# Patient Record
Sex: Male | Born: 1970 | Race: White | Hispanic: No | Marital: Single | Smoking: Never smoker
Health system: Southern US, Community
[De-identification: ages and names within clinical notes are randomized; demographics above are authoritative.]

## PROBLEM LIST (undated history)

## (undated) DIAGNOSIS — I25110 Atherosclerotic heart disease of native coronary artery with unstable angina pectoris: Secondary | ICD-10-CM

## (undated) DIAGNOSIS — J45909 Unspecified asthma, uncomplicated: Secondary | ICD-10-CM

## (undated) DIAGNOSIS — R51 Headache: Secondary | ICD-10-CM

## (undated) DIAGNOSIS — E039 Hypothyroidism, unspecified: Secondary | ICD-10-CM

## (undated) DIAGNOSIS — K3184 Gastroparesis: Secondary | ICD-10-CM

## (undated) DIAGNOSIS — R269 Unspecified abnormalities of gait and mobility: Secondary | ICD-10-CM

## (undated) DIAGNOSIS — F419 Anxiety disorder, unspecified: Secondary | ICD-10-CM

## (undated) DIAGNOSIS — F32A Depression, unspecified: Secondary | ICD-10-CM

## (undated) DIAGNOSIS — G629 Polyneuropathy, unspecified: Secondary | ICD-10-CM

## (undated) DIAGNOSIS — E1121 Type 2 diabetes mellitus with diabetic nephropathy: Secondary | ICD-10-CM

## (undated) DIAGNOSIS — Z992 Dependence on renal dialysis: Secondary | ICD-10-CM

## (undated) DIAGNOSIS — M21371 Foot drop, right foot: Secondary | ICD-10-CM

## (undated) DIAGNOSIS — N186 End stage renal disease: Secondary | ICD-10-CM

## (undated) DIAGNOSIS — G6181 Chronic inflammatory demyelinating polyneuritis: Secondary | ICD-10-CM

## (undated) DIAGNOSIS — I82409 Acute embolism and thrombosis of unspecified deep veins of unspecified lower extremity: Secondary | ICD-10-CM

## (undated) DIAGNOSIS — I10 Essential (primary) hypertension: Secondary | ICD-10-CM

## (undated) DIAGNOSIS — F329 Major depressive disorder, single episode, unspecified: Secondary | ICD-10-CM

## (undated) DIAGNOSIS — R519 Headache, unspecified: Secondary | ICD-10-CM

## (undated) DIAGNOSIS — N049 Nephrotic syndrome with unspecified morphologic changes: Secondary | ICD-10-CM

## (undated) DIAGNOSIS — M21372 Foot drop, left foot: Secondary | ICD-10-CM

## (undated) DIAGNOSIS — I447 Left bundle-branch block, unspecified: Secondary | ICD-10-CM

## (undated) HISTORY — PX: TONSILLECTOMY AND ADENOIDECTOMY: SUR1326

## (undated) HISTORY — DX: Chronic inflammatory demyelinating polyneuritis: G61.81

## (undated) HISTORY — DX: Foot drop, left foot: M21.372

## (undated) HISTORY — DX: Acute embolism and thrombosis of unspecified deep veins of unspecified lower extremity: I82.409

## (undated) HISTORY — PX: RETINAL LASER PROCEDURE: SHX2339

## (undated) HISTORY — PX: EYE SURGERY: SHX253

## (undated) HISTORY — DX: Unspecified abnormalities of gait and mobility: R26.9

## (undated) HISTORY — DX: Nephrotic syndrome with unspecified morphologic changes: N04.9

## (undated) HISTORY — PX: FRACTURE SURGERY: SHX138

## (undated) HISTORY — DX: Foot drop, right foot: M21.371

---
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
## 2011-04-21 ENCOUNTER — Ambulatory Visit: Payer: Self-pay

## 2011-05-24 ENCOUNTER — Telehealth: Payer: Self-pay

## 2011-05-24 ENCOUNTER — Other Ambulatory Visit: Payer: Self-pay | Admitting: Family Medicine

## 2011-05-24 MED ORDER — ALPRAZOLAM 0.5 MG PO TABS: 0.5000 mg | ORAL_TABLET | Freq: Two times a day (BID) | ORAL | Status: AC

## 2011-05-24 NOTE — Telephone Encounter (Signed)
Tim from [facility] in [place] states that pt needs a refill on xanax .5 mg

## 2011-05-24 NOTE — Telephone Encounter (Signed)
Please pull paper chart.  

## 2011-05-25 NOTE — Telephone Encounter (Signed)
Already refilled

## 2011-07-22 ENCOUNTER — Other Ambulatory Visit: Payer: Self-pay | Admitting: Family Medicine

## 2011-07-25 ENCOUNTER — Other Ambulatory Visit: Payer: Self-pay | Admitting: *Deleted

## 2011-07-25 MED ORDER — ALPRAZOLAM 0.5 MG PO TABS: 0.5000 mg | ORAL_TABLET | Freq: Two times a day (BID) | ORAL | Status: DC

## 2011-08-16 ENCOUNTER — Other Ambulatory Visit: Payer: Self-pay | Admitting: Family Medicine

## 2011-08-17 ENCOUNTER — Other Ambulatory Visit: Payer: Self-pay | Admitting: *Deleted

## 2011-08-21 ENCOUNTER — Telehealth: Payer: Self-pay

## 2011-08-21 NOTE — Telephone Encounter (Signed)
Pharmacy is calling for xanax refill - [facility] in [place] [address], [place]  [phone]

## 2011-08-22 NOTE — Telephone Encounter (Signed)
Looks like patient is due for recheck, or can we given partial rx?

## 2011-08-23 MED ORDER — ALPRAZOLAM 0.5 MG PO TABS: 0.5000 mg | ORAL_TABLET | Freq: Two times a day (BID) | ORAL | Status: AC

## 2011-08-23 NOTE — Telephone Encounter (Signed)
Done and printed. No further refill without office visit.

## 2011-08-23 NOTE — Telephone Encounter (Signed)
Faxed in Rx for xanax, but need to advise pt that he needs OV before any add'l RFs. He has been given #30 tabs.  LMOM to CB

## 2011-08-24 NOTE — Telephone Encounter (Signed)
I have advised patient. He has no insurance currently, but will make arrangements to come in. He is going on vacation and will try to come in for this.

## 2011-08-24 NOTE — Telephone Encounter (Signed)
I have left message for him to call me back about his need for office visit.

## 2011-10-04 ENCOUNTER — Other Ambulatory Visit: Payer: Self-pay | Admitting: Family Medicine

## 2011-10-04 DIAGNOSIS — M549 Dorsalgia, unspecified: Secondary | ICD-10-CM

## 2011-10-04 MED ORDER — ALPRAZOLAM 0.5 MG PO TABS: 0.5000 mg | ORAL_TABLET | Freq: Every evening | ORAL | Status: DC | PRN

## 2011-10-04 MED ORDER — CYCLOBENZAPRINE HCL 10 MG PO TABS: 10.0000 mg | ORAL_TABLET | Freq: Every evening | ORAL | Status: DC | PRN

## 2011-11-28 ENCOUNTER — Ambulatory Visit (INDEPENDENT_AMBULATORY_CARE_PROVIDER_SITE_OTHER): Payer: 59 | Admitting: Family Medicine

## 2011-11-28 VITALS — BP 140/90 | HR 112 | Temp 98.8°F | Resp 20 | Ht 72.0 in | Wt 219.0 lb

## 2011-11-28 DIAGNOSIS — E1165 Type 2 diabetes mellitus with hyperglycemia: Secondary | ICD-10-CM

## 2011-11-28 DIAGNOSIS — IMO0001 Reserved for inherently not codable concepts without codable children: Secondary | ICD-10-CM

## 2011-11-28 LAB — COMPREHENSIVE METABOLIC PANEL
ALT: 19 U/L (ref 0–53)
AST: 15 U/L (ref 0–37)
Albumin: 4.4 g/dL (ref 3.5–5.2)
Alkaline Phosphatase: 85 U/L (ref 39–117)
BUN: 16 mg/dL (ref 6–23)
CO2: 27 mEq/L (ref 19–32)
Calcium: 9.9 mg/dL (ref 8.4–10.5)
Chloride: 101 mEq/L (ref 96–112)
Creat: 0.92 mg/dL (ref 0.50–1.35)
Glucose, Bld: 298 mg/dL — ABNORMAL HIGH (ref 70–99)
Potassium: 4.1 mEq/L (ref 3.5–5.3)
Sodium: 135 mEq/L (ref 135–145)
Total Bilirubin: 0.5 mg/dL (ref 0.3–1.2)
Total Protein: 7.3 g/dL (ref 6.0–8.3)

## 2011-11-28 LAB — LIPID PANEL
Cholesterol: 251 mg/dL — ABNORMAL HIGH (ref 0–200)
HDL: 41 mg/dL (ref >39)
LDL Cholesterol: 159 mg/dL — ABNORMAL HIGH (ref 0–99)
Total CHOL/HDL Ratio: 6.1 Ratio
Triglycerides: 253 mg/dL — ABNORMAL HIGH (ref <150)
VLDL: 51 mg/dL — ABNORMAL HIGH (ref 0–40)

## 2011-11-28 LAB — POCT UA - MICROSCOPIC ONLY
Bacteria, U Microscopic: NEGATIVE
Casts, Ur, LPF, POC: NEGATIVE
Crystals, Ur, HPF, POC: NEGATIVE
Mucus, UA: NEGATIVE
Yeast, UA: NEGATIVE

## 2011-11-28 LAB — POCT GLYCOSYLATED HEMOGLOBIN (HGB A1C): Hemoglobin A1C: 13.7

## 2011-11-28 LAB — POCT URINALYSIS DIPSTICK
Bilirubin, UA: NEGATIVE
Glucose, UA: 500
Ketones, UA: 15
Leukocytes, UA: NEGATIVE
Nitrite, UA: NEGATIVE
Protein, UA: 30
Spec Grav, UA: 1.015
Urobilinogen, UA: 0.2
pH, UA: 6

## 2011-11-28 LAB — GLUCOSE, POCT (MANUAL RESULT ENTRY): POC Glucose: 293 mg/dl — AB (ref 70–99)

## 2011-11-28 MED ORDER — SITAGLIPTIN PHOS-METFORMIN HCL 50-500 MG PO TABS: 1.0000 | ORAL_TABLET | Freq: Two times a day (BID) | ORAL | Status: DC

## 2011-11-28 NOTE — Progress Notes (Signed)
@[email or web address] @  Patient ID: Russell [name] MRN: [id number], DOB: 03/15/1970, 41 y.o. Date of Encounter: 11/28/2011, 2:01 PM  Primary Physician: No primary provider on file.  Chief Complaint: Diabetes follow up  HPI: 41 y.o. year old male with history below presents for diabetes mellitus which he has had x 4 years. Not doing well because of weight loss, burning hands and numb feet.. Taking medications daily.  Notes polydipsia, polyphagia, polyuria, or nocturia.  Blood sugars at home:  >500 He has been passing kidney stones Exercising regularly. Last A1C: ove a year ago   Past Medical History  Diagnosis Date  . Diabetes mellitus without complication      Home Meds: Prior to Admission medications   Medication Sig Start Date End Date Taking? Authorizing Provider  ALPRAZolam Prudy Feeler) 0.5 MG tablet Take 1 tablet (0.5 mg total) by mouth at bedtime as needed for sleep. 10/04/11  Yes [name], MD  cyclobenzaprine (FLEXERIL) 10 MG tablet Take 1 tablet (10 mg total) by mouth at bedtime as needed for muscle spasms. 10/04/11  Yes [name], MD  gabapentin (NEURONTIN) 600 MG tablet Take 600 mg by mouth 4 (four) times daily.   Yes Historical Provider, MD  ibuprofen (ADVIL,MOTRIN) 800 MG tablet Take 800 mg by mouth 2 (two) times daily as needed.   Yes Historical Provider, MD  metFORMIN (GLUMETZA) 1000 MG (MOD) 24 hr tablet Take 1,000 mg by mouth 2 (two) times daily.   Yes Historical Provider, MD    Allergies: No Known Allergies  History   Social History  . Marital Status: Single    Spouse Name: N/A    Number of Children: N/A  . Years of Education: N/A   Occupational History  . Not on file.   Social History Main Topics  . Smoking status: Never Smoker   . Smokeless tobacco: Not on file  . Alcohol Use: Not on file  . Drug Use: No  . Sexually Active: Yes   Other Topics Concern  . Not on file   Social History Narrative  . No narrative on file     Review of  Systems: Constitutional: negative for chills, fever, night sweats, weight changes, or fatigue  HEENT: negative for vision changes, hearing loss, congestion, rhinorrhea, or epistaxis Cardiovascular: negative for chest pain, palpitations, diaphoresis, DOE, orthopnea, or edema Respiratory: negative for hemoptysis, wheezing, shortness of breath, dyspnea, or cough Abdominal: negative for abdominal pain, nausea, vomiting, diarrhea, or constipation Dermatological: negative for rash, erythema, or wounds Neurologic: negative for headache, dizziness, or syncope Renal:  Negative for polyuria, polydipsia, or dysuria All other systems reviewed and are otherwise negative with the exception to those above and in the HPI.   Physical Exam: Blood pressure 140/90, pulse 112, temperature 98.8 F (37.1 C), temperature source Oral, resp. rate 20, height 6' (1.829 m), weight 219 lb (99.338 kg), SpO2 99.00%., Body mass index is 29.70 kg/(m^2). General: Well developed, well nourished, in no acute distress. Head: Normocephalic, atraumatic, eyes without discharge, sclera non-icteric, nares are without discharge. Bilateral auditory canals clear, TM's are without perforation, pearly grey and translucent with reflective cone of light bilaterally. Oral cavity moist, posterior pharynx without exudate, erythema, peritonsillar abscess, or post nasal drip.  Neck: Supple. No thyromegaly. Full ROM. No lymphadenopathy. Lungs: Clear bilaterally to auscultation without wheezes, rales, or rhonchi. Breathing is unlabored. Heart: RRR with S1 S2. No murmurs, rubs, or gallops appreciated. Abdomen: Soft, non-tender, non-distended with normoactive bowel sounds. No hepatosplenomegaly. No rebound/guarding. No obvious abdominal masses.  Msk:  Strength and tone normal for age. Extremities/Skin: Warm and dry. No clubbing or cyanosis. No edema. No rashes, wounds, or suspicious lesions.  Neuro: Alert and oriented X 3. Moves all extremities  spontaneously. Gait is normal. CNII-XII grossly in tact. Psych:  Responds to questions appropriately with a normal affect.   Labs: Results for orders placed in visit on 11/28/11  POCT GLYCOSYLATED HEMOGLOBIN (HGB A1C)      Component Value Range   Hemoglobin A1C 13.7    GLUCOSE, POCT (MANUAL RESULT ENTRY)      Component Value Range   POC Glucose 293 (*) 70 - 99 mg/dl  POCT URINALYSIS DIPSTICK      Component Value Range   Color, UA yellow     Clarity, UA clear     Glucose, UA 500     Bilirubin, UA neg     Ketones, UA 15     Spec Grav, UA 1.015     Blood, UA trace-intact     pH, UA 6.0     Protein, UA 30     Urobilinogen, UA 0.2     Nitrite, UA neg     Leukocytes, UA Negative    POCT UA - MICROSCOPIC ONLY      Component Value Range   WBC, Ur, HPF, POC 0-2     RBC, urine, microscopic 1-3     Bacteria, U Microscopic neg     Mucus, UA neg     Epithelial cells, urine per micros 0-1     Crystals, Ur, HPF, POC neg     Casts, Ur, LPF, POC neg     Yeast, UA neg        ASSESSMENT AND PLAN:  41 y.o. year old male with uncontrolled type 2 diabetes Check your sugar twice daily Recheck Saturday  -Levimir 30 units subq now.  Patient will take the pen home for possible future doses 1. Diabetes type 2, uncontrolled  POCT glycosylated hemoglobin (Hb A1C), POCT glucose (manual entry), Comprehensive metabolic panel, Lipid panel, POCT urinalysis dipstick, POCT UA - Microscopic Only, sitaGLIPtan-metformin (JANUMET) 50-500 MG per tablet     Signed, [name], MD 11/28/2011 2:01 PM

## 2011-11-29 ENCOUNTER — Other Ambulatory Visit: Payer: Self-pay | Admitting: Family Medicine

## 2011-11-29 DIAGNOSIS — E785 Hyperlipidemia, unspecified: Secondary | ICD-10-CM

## 2011-11-29 MED ORDER — SIMVASTATIN 40 MG PO TABS: 40.0000 mg | ORAL_TABLET | Freq: Every day | ORAL | Status: DC

## 2011-12-02 ENCOUNTER — Ambulatory Visit (INDEPENDENT_AMBULATORY_CARE_PROVIDER_SITE_OTHER): Payer: 59 | Admitting: Family Medicine

## 2011-12-02 VITALS — BP 128/78 | HR 107 | Temp 98.1°F | Resp 17 | Ht 73.0 in | Wt 223.0 lb

## 2011-12-02 DIAGNOSIS — E1165 Type 2 diabetes mellitus with hyperglycemia: Secondary | ICD-10-CM

## 2011-12-02 DIAGNOSIS — E1065 Type 1 diabetes mellitus with hyperglycemia: Secondary | ICD-10-CM

## 2011-12-02 NOTE — Progress Notes (Signed)
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 Since last visit he is noted decreasing in the tingling in his hands, decreased in polyuria, decreased and polydipsia. He's been checking his sugars and they've been gradually coming down so that is fasting this morning was 250. He's taking his Januvia 50/500 twice a day. He does have Levemir home and he does have NovoLog at home but hasn't been using them.  We discussed his diet and his exercise routine. Currently he's not exercising but feels that he gets funny of exercise at work. I encouraged him to take a walk or spin on a bicycle.  Objective: patient looks much healthier today with better color. He's calm and articulate.  I spent 30 minutes face to face with patient  Assessment: Patient is much better now with neuropathy easing and other symptoms of diabetes improving.  Plan: Janumet 50/500 twice daily Add Metformin 500 in evening Add Levimir 20 units daily until BS is below 200  Physical exam December 17th at 8 o'clock at [address]

## 2011-12-02 NOTE — Patient Instructions (Addendum)
Janumet 50/500 twice daily Add Metformin 500 in evening Add Levimir 20 units daily until BS is below 200  Physical exam December 17th at 8 o'clock at [address]

## 2011-12-19 ENCOUNTER — Ambulatory Visit (INDEPENDENT_AMBULATORY_CARE_PROVIDER_SITE_OTHER): Payer: 59 | Admitting: Family Medicine

## 2011-12-19 ENCOUNTER — Encounter: Payer: Self-pay | Admitting: Family Medicine

## 2011-12-19 ENCOUNTER — Other Ambulatory Visit: Payer: Self-pay | Admitting: Family Medicine

## 2011-12-19 VITALS — BP 157/88 | HR 116 | Temp 98.4°F | Resp 16 | Ht 72.0 in | Wt 228.6 lb

## 2011-12-19 DIAGNOSIS — I8390 Asymptomatic varicose veins of unspecified lower extremity: Secondary | ICD-10-CM

## 2011-12-19 DIAGNOSIS — E1165 Type 2 diabetes mellitus with hyperglycemia: Secondary | ICD-10-CM

## 2011-12-19 DIAGNOSIS — Z0000 Encounter for general adult medical examination without abnormal findings: Secondary | ICD-10-CM

## 2011-12-19 DIAGNOSIS — M549 Dorsalgia, unspecified: Secondary | ICD-10-CM

## 2011-12-19 DIAGNOSIS — E1140 Type 2 diabetes mellitus with diabetic neuropathy, unspecified: Secondary | ICD-10-CM

## 2011-12-19 DIAGNOSIS — G8929 Other chronic pain: Secondary | ICD-10-CM

## 2011-12-19 DIAGNOSIS — E785 Hyperlipidemia, unspecified: Secondary | ICD-10-CM

## 2011-12-19 DIAGNOSIS — E119 Type 2 diabetes mellitus without complications: Secondary | ICD-10-CM

## 2011-12-19 DIAGNOSIS — Z23 Encounter for immunization: Secondary | ICD-10-CM

## 2011-12-19 MED ORDER — TETANUS-DIPHTH-ACELL PERTUSSIS 5-2.5-18.5 LF-MCG/0.5 IM SUSP
0.5000 mL | Freq: Once | INTRAMUSCULAR | Status: AC
  Administered 2011-12-19: 0.5 mL via INTRAMUSCULAR

## 2011-12-19 MED ORDER — IBUPROFEN 800 MG PO TABS: 800.0000 mg | ORAL_TABLET | Freq: Three times a day (TID) | ORAL | Status: DC | PRN

## 2011-12-19 MED ORDER — CYCLOBENZAPRINE HCL 10 MG PO TABS: 10.0000 mg | ORAL_TABLET | Freq: Every evening | ORAL | Status: DC | PRN

## 2011-12-19 MED ORDER — GABAPENTIN 600 MG PO TABS: 600.0000 mg | ORAL_TABLET | Freq: Two times a day (BID) | ORAL | Status: DC

## 2011-12-19 MED ORDER — ALPRAZOLAM 0.5 MG PO TABS: 0.5000 mg | ORAL_TABLET | Freq: Two times a day (BID) | ORAL | Status: DC | PRN

## 2011-12-19 MED ORDER — PNEUMOCOCCAL VAC POLYVALENT 25 MCG/0.5ML IJ INJ
0.5000 mL | INJECTION | INTRAMUSCULAR | Status: AC
  Administered 2011-12-19: 0.5 mL via INTRAMUSCULAR

## 2011-12-19 MED ORDER — INFLUENZA VIRUS VACC SPLIT PF IM SUSP
0.5000 mL | INTRAMUSCULAR | Status: AC
  Administered 2011-12-19: 0.5 mL via INTRAMUSCULAR

## 2011-12-19 MED ORDER — SITAGLIPTIN PHOS-METFORMIN HCL 50-500 MG PO TABS: 1.0000 | ORAL_TABLET | Freq: Two times a day (BID) | ORAL | Status: DC

## 2011-12-19 NOTE — Telephone Encounter (Signed)
Please pull paper chart. Med not listed in [facility].

## 2011-12-19 NOTE — Progress Notes (Signed)
Patient ID: AMARRI SATTERLY MRN: [id number], DOB: [birth year] 41 y.o. Date of Encounter: 12/19/2011, 8:53 AM  Primary Physician: No primary provider on file.  Chief Complaint: Physical (CPE)  HPI: 41 y.o. y/o male with history noted below here for CPE.  Doing well. Russell [name] works in [employer]. He works a night shift and finally has insurance. He has diabetes which is been out of control until recently and now his sugars are running between 150 and 200 he is feeling much better. He has no polyuria, visual disturbance in his feet are normal. He uses Neurontin to stop the pain and it seems to last about 3-4 hours after which he took and tingling again in his feet. He has a small lump just superior to the right testicle. This is nontender and not increasing in size.  Review of Systems: Consitutional: No fever, chills, fatigue, night sweats, lymphadenopathy, or weight changes. Eyes: No visual changes, eye redness, or discharge. ENT/Mouth: Ears: No otalgia, tinnitus, hearing loss, discharge. Nose: No congestion, rhinorrhea, sinus pain, or epistaxis. Throat: No sore throat, post nasal drip, or teeth pain. Cardiovascular: No CP, palpitations, diaphoresis, DOE, edema, orthopnea, PND. Respiratory: No cough, hemoptysis, SOB, or wheezing. Gastrointestinal: No anorexia, dysphagia, reflux, pain, nausea, vomiting, hematemesis, diarrhea, constipation, BRBPR, or melena. Genitourinary: No dysuria, frequency, urgency, hematuria, incontinence, nocturia, decreased urinary stream, discharge, impotence, or testicular pain/masses. Musculoskeletal: No decreased ROM, , stiffness, joint swelling, or weakness. He does have chronic back pain Skin: No rash, erythema, lesion changes, pain, warmth, jaundice, or pruritis. Neurological: No headache, dizziness, syncope, seizures, tremors, memory loss, coordination problems, . Psychological: No anxiety, depression, hallucinations, SI/HI. Endocrine: No fatigue, polydipsia,  polyphagia, polyuria, or known diabetes. All other systems were reviewed and are otherwise negative.  Past Medical History  Diagnosis Date  . Diabetes mellitus without complication      Past Surgical History  Procedure Date  . Right ankle fx repair     Home Meds:  Prior to Admission medications   Medication Sig Start Date End Date Taking? Authorizing Provider  cyclobenzaprine (FLEXERIL) 10 MG tablet Take 1 tablet (10 mg total) by mouth at bedtime as needed for muscle spasms. 12/19/11  Yes [name], MD  gabapentin (NEURONTIN) 600 MG tablet Take 1 tablet (600 mg total) by mouth 2 (two) times daily. 12/19/11  Yes [name], MD  ibuprofen (ADVIL,MOTRIN) 800 MG tablet Take 800 mg by mouth 2 (two) times daily as needed.   Yes Historical Provider, MD  simvastatin (ZOCOR) 40 MG tablet Take 1 tablet (40 mg total) by mouth at bedtime. 11/29/11  Yes [name], MD  sitaGLIPtan-metformin (JANUMET) 50-500 MG per tablet Take 1 tablet by mouth 2 (two) times daily with a meal. 12/19/11  Yes [name], MD  ALPRAZolam Prudy Feeler) 0.5 MG tablet Take 1 tablet (0.5 mg total) by mouth 2 (two) times daily as needed for sleep. 12/19/11   [name], MD    Allergies: No Known Allergies  History   Social History  . Marital Status: Single    Spouse Name: N/A    Number of Children: N/A  . Years of Education: N/A   Occupational History  . Not on file.   Social History Main Topics  . Smoking status: Never Smoker   . Smokeless tobacco: Not on file  . Alcohol Use: Not on file  . Drug Use: No  . Sexually Active: Yes   Other Topics Concern  . Not on file   Social History Narrative  .  No narrative on file    No family history on file.  Physical Exam:  130/78 Blood pressure 157/88, pulse 116, temperature 98.4 F (36.9 C), temperature source Oral, resp. rate 16, height 6' (1.829 m), weight 228 lb 9.6 oz (103.692 kg), SpO2 98.00%.  General: Well developed, well nourished,  in no acute distress. HEENT: Normocephalic, atraumatic. Conjunctiva pink, sclera non-icteric. Pupils 2 mm constricting to 1 mm, round, regular, and equally reactive to light and accomodation. EOMI. Internal auditory canal clear. TMs with good cone of light and without pathology. Nasal mucosa pink. Nares are without discharge. No sinus tenderness. Oral mucosa pink. Dentition carie number 18. Pharynx without exudate.   Neck: Supple. Trachea midline. No thyromegaly. Full ROM. No lymphadenopathy. Lungs: Clear to auscultation bilaterally without wheezes, rales, or rhonchi. Breathing is of normal effort and unlabored. Cardiovascular: RRR with S1 S2. No murmurs, rubs, or gallops appreciated. Distal pulses 2+ symmetrically. No carotid or abdominal bruits Abdomen: Soft, non-tender, non-distended with normoactive bowel sounds. No hepatosplenomegaly or masses. No rebound/guarding. No CVA tenderness. Without hernias.  Rectal: No external hemorrhoids or fissures.  Genitourinary:  circumcised male. No penile lesions. Testes descended bilaterally, and smooth without tenderness or masses.   Varicocele right side Musculoskeletal: Full range of motion and 5/5 strength throughout. Without swelling, atrophy, tenderness, crepitus, or warmth. Extremities without clubbing, cyanosis, or edema. Calves supple. Skin: Warm and moist without erythema, ecchymosis, wounds, or rash. Neuro: A+Ox3. CN II-XII grossly intact. Moves all extremities spontaneously. Full sensation throughout. Normal gait. DTR 2+ throughout upper and lower extremities. Finger to nose intact. Psych:  Responds to questions appropriately with a normal affect.    Assessment/Plan:  41 y.o. y/o  male here for CPE with diabetes. Will recheck in March. Will see dentist and eye doctor Continue to monitor blood sugar.  Follow up in 3 months. - 1. Back pain  ALPRAZolam (XANAX) 0.5 MG tablet, cyclobenzaprine (FLEXERIL) 10 MG tablet, ibuprofen (ADVIL,MOTRIN) 800 MG  tablet  2. Diabetes type 2, uncontrolled  sitaGLIPtan-metformin (JANUMET) 50-500 MG per tablet, influenza  inactive virus vaccine (FLUZONE/FLUARIX) injection 0.5 mL, pneumococcal 23 valent vaccine (PNU-IMMUNE) injection 0.5 mL, TDaP (BOOSTRIX) injection 0.5 mL  3. Diabetic neuropathy  gabapentin (NEURONTIN) 600 MG tablet  4. Annual physical exam  influenza  inactive virus vaccine (FLUZONE/FLUARIX) injection 0.5 mL, pneumococcal 23 valent vaccine (PNU-IMMUNE) injection 0.5 mL, TDaP (BOOSTRIX) injection 0.5 mL   Referred to dentist and ophthalmologist.  Also referred to vein specialist Signed, [name], MD 12/19/2011 8:53 AM

## 2011-12-19 NOTE — Telephone Encounter (Signed)
Chart pulled to PA pool at nurses station DOS 10/14/10

## 2011-12-19 NOTE — Patient Instructions (Addendum)
Health Maintenance, Males A healthy lifestyle and preventative care can promote health and wellness.  Maintain regular health, dental, and eye exams.  Eat a healthy diet. Foods like vegetables, fruits, whole grains, low-fat dairy products, and lean protein foods contain the nutrients you need without too many calories. Decrease your intake of foods high in solid fats, added sugars, and salt. Get information about a proper diet from your caregiver, if necessary.  Regular physical exercise is one of the most important things you can do for your health. Most adults should get at least 150 minutes of moderate-intensity exercise (any activity that increases your heart rate and causes you to sweat) each week. In addition, most adults need muscle-strengthening exercises on 2 or more days a week.   Maintain a healthy weight. The body mass index (BMI) is a screening tool to identify possible weight problems. It provides an estimate of body fat based on height and weight. Your caregiver can help determine your BMI, and can help you achieve or maintain a healthy weight. For adults 20 years and older:  A BMI below 18.5 is considered underweight.  A BMI of 18.5 to 24.9 is normal.  A BMI of 25 to 29.9 is considered overweight.  A BMI of 30 and above is considered obese.  Maintain normal blood lipids and cholesterol by exercising and minimizing your intake of saturated fat. Eat a balanced diet with plenty of fruits and vegetables. Blood tests for lipids and cholesterol should begin at age 20 and be repeated every 5 years. If your lipid or cholesterol levels are high, you are over 50, or you are a high risk for heart disease, you may need your cholesterol levels checked more frequently.Ongoing high lipid and cholesterol levels should be treated with medicines, if diet and exercise are not effective.  If you smoke, find out from your caregiver how to quit. If you do not use tobacco, do not start.  If you  choose to drink alcohol, do not exceed 2 drinks per day. One drink is considered to be 12 ounces (355 mL) of beer, 5 ounces (148 mL) of wine, or 1.5 ounces (44 mL) of liquor.  Avoid use of street drugs. Do not share needles with anyone. Ask for help if you need support or instructions about stopping the use of drugs.  High blood pressure causes heart disease and increases the risk of stroke. Blood pressure should be checked at least every 1 to 2 years. Ongoing high blood pressure should be treated with medicines if weight loss and exercise are not effective.  If you are 45 to 40 years old, ask your caregiver if you should take aspirin to prevent heart disease.  Diabetes screening involves taking a blood sample to check your fasting blood sugar level. This should be done once every 3 years, after age 45, if you are within normal weight and without risk factors for diabetes. Testing should be considered at a younger age or be carried out more frequently if you are overweight and have at least 1 risk factor for diabetes.  Colorectal cancer can be detected and often prevented. Most routine colorectal cancer screening begins at the age of 50 and continues through age 75. However, your caregiver may recommend screening at an earlier age if you have risk factors for colon cancer. On a yearly basis, your caregiver may provide home test kits to check for hidden blood in the stool. Use of a small camera at the end of a tube,   to directly examine the colon (sigmoidoscopy or colonoscopy), can detect the earliest forms of colorectal cancer. Talk to your caregiver about this at age 50, when routine screening begins. Direct examination of the colon should be repeated every 5 to 10 years through age 75, unless early forms of pre-cancerous polyps or small growths are found.  Hepatitis C blood testing is recommended for all people born from 1945 through 1965 and any individual with known risks for hepatitis C.  Healthy  men should no longer receive prostate-specific antigen (PSA) blood tests as part of routine cancer screening. Consult with your caregiver about prostate cancer screening.  Testicular cancer screening is not recommended for adolescents or adult males who have no symptoms. Screening includes self-exam, caregiver exam, and other screening tests. Consult with your caregiver about any symptoms you have or any concerns you have about testicular cancer.  Practice safe sex. Use condoms and avoid high-risk sexual practices to reduce the spread of sexually transmitted infections (STIs).  Use sunscreen with a sun protection factor (SPF) of 30 or greater. Apply sunscreen liberally and repeatedly throughout the day. You should seek shade when your shadow is shorter than you. Protect yourself by wearing long sleeves, pants, a wide-brimmed hat, and sunglasses year round, whenever you are outdoors.  Notify your caregiver of new moles or changes in moles, especially if there is a change in shape or color. Also notify your caregiver if a mole is larger than the size of a pencil eraser.  A one-time screening for abdominal aortic aneurysm (AAA) and surgical repair of large AAAs by sound wave imaging (ultrasonography) is recommended for ages 65 to 75 years who are current or former smokers.  Stay current with your immunizations. Document Released: 06/17/2007 Document Revised: 03/13/2011 Document Reviewed: 05/16/2010 [employer] Patient Information 2013 [employer].  

## 2012-01-14 ENCOUNTER — Other Ambulatory Visit: Payer: Self-pay | Admitting: Family Medicine

## 2012-05-15 ENCOUNTER — Other Ambulatory Visit: Payer: Self-pay | Admitting: Family Medicine

## 2012-05-15 DIAGNOSIS — E785 Hyperlipidemia, unspecified: Secondary | ICD-10-CM

## 2012-05-15 DIAGNOSIS — E1165 Type 2 diabetes mellitus with hyperglycemia: Secondary | ICD-10-CM

## 2012-05-15 MED ORDER — SIMVASTATIN 40 MG PO TABS: 40.0000 mg | ORAL_TABLET | Freq: Every day | ORAL | Status: DC

## 2012-05-15 MED ORDER — METFORMIN HCL 500 MG PO TABS: ORAL_TABLET | ORAL | Status: DC

## 2012-05-15 MED ORDER — SITAGLIPTIN PHOS-METFORMIN HCL 50-500 MG PO TABS: 1.0000 | ORAL_TABLET | Freq: Two times a day (BID) | ORAL | Status: DC

## 2012-06-11 ENCOUNTER — Other Ambulatory Visit: Payer: Self-pay

## 2012-06-11 DIAGNOSIS — E1165 Type 2 diabetes mellitus with hyperglycemia: Secondary | ICD-10-CM

## 2012-06-11 MED ORDER — METFORMIN HCL 500 MG PO TABS: ORAL_TABLET | ORAL | Status: DC

## 2012-08-28 ENCOUNTER — Ambulatory Visit: Payer: Managed Care, Other (non HMO) | Admitting: Family Medicine

## 2012-08-28 VITALS — BP 132/90 | HR 106 | Temp 97.9°F | Resp 18 | Ht 73.0 in | Wt 216.0 lb

## 2012-08-28 DIAGNOSIS — M549 Dorsalgia, unspecified: Secondary | ICD-10-CM

## 2012-08-28 DIAGNOSIS — H2000 Unspecified acute and subacute iridocyclitis: Secondary | ICD-10-CM

## 2012-08-28 DIAGNOSIS — IMO0001 Reserved for inherently not codable concepts without codable children: Secondary | ICD-10-CM

## 2012-08-28 DIAGNOSIS — E1165 Type 2 diabetes mellitus with hyperglycemia: Secondary | ICD-10-CM

## 2012-08-28 MED ORDER — PREDNISOLONE ACETATE 1 % OP SUSP: 1.0000 [drp] | Freq: Three times a day (TID) | OPHTHALMIC | Status: DC

## 2012-08-28 MED ORDER — METFORMIN HCL 500 MG PO TABS: ORAL_TABLET | ORAL | Status: DC

## 2012-08-28 MED ORDER — ALPRAZOLAM 0.5 MG PO TABS: 0.5000 mg | ORAL_TABLET | Freq: Two times a day (BID) | ORAL | Status: DC | PRN

## 2012-08-28 MED ORDER — CYCLOBENZAPRINE HCL 10 MG PO TABS: 10.0000 mg | ORAL_TABLET | Freq: Every evening | ORAL | Status: DC | PRN

## 2012-08-28 MED ORDER — TOBRAMYCIN 0.3 % OP SOLN: 1.0000 [drp] | Freq: Three times a day (TID) | OPHTHALMIC | Status: DC

## 2012-08-28 NOTE — Progress Notes (Signed)
42 yo with progressive left eye pain associated with photophobia.  No trauma or F.B.  Objective:  Mild injection OS PERLA Lids normal  Assessment:  Iritis Iritis acute or subacute - Plan: tobramycin (TOBREX) 0.3 % ophthalmic solution, prednisoLONE acetate (PRED FORTE) 1 % ophthalmic suspension  Signed, [name], MD

## 2012-08-28 NOTE — Patient Instructions (Addendum)
Iritis °Iritis is an inflammation of the colored part of the eye (iris). Other parts at the front of the eye may also be inflamed. The iris is part of the middle layer of the eyeball which is called the uvea or the uveal track. Any part of the uveal track can become inflamed. The other portions of the uveal track are the choroid (the thin membrane under the outer layer of the eye), and the ciliary body (joins the choroid and the iris and produces the fluid in the front of the eye).  °It is extremely important to treat iritis early, as it may lead to internal eye damage causing scarring or diseases such as glaucoma. Some people have only one attack of iritis (in one or both eyes) in their lifetime, while others may get it many times. °CAUSES °Iritis can be associated with many different diseases, but mostly occurs in otherwise healthy people. Examples of diseases that can be associated with iritis include: °· Diseases where the body's immune system attacks tissues within your own body (autoimmune diseases). °· Infections (tuberculosis, gonorrhea, fungus infections, Lyme disease, infection of the lining of the heart). °· Trauma or injury. °· Eye diseases (acute glaucoma and others). °· Inflammation from other parts of the uveal track. °· Severe eye infections. °· Other rare diseases. °SYMPTOMS °· Eye pain or aching. °· Sensitivity to light. °· Loss of sight or blurred vision. °· Redness of the eye. This is often accompanied by a ring of redness around the outside of the cornea, or clear covering at the front of the eye (ciliary flush). °· Excessive tearing of the eye(s). °· A small pupil that does not enlarge in the dark and stays smaller than the other eye's pupil. °· A whitish area that obscures the lower part of the colored circular iris. Sometimes this is visible when looking at the eye, where the whitish area has a "fluid level" or flat top. This is called a "hypopyon" and is actually pus inside the eye. °Since  iritis causes the eye to become red, it is often confused with a much less dangerous form of "pink eye" or conjunctivitis. One of the most important symptoms is sensitivity to light. Anytime there is redness, discomfort in the eye(s) and extreme light sensitivity, it is extremely important to see an ophthalmologist as soon as possible. °TREATMENT °Acute iritis requires prompt medical evaluation by an eye specialist (ophthalmologist.) Treatment depends on the underlying cause but may include: °· Corticosteroid eye drops and dilating eye drops. Follow your caregiver's exact instructions on taking and stopping corticosteroid medications (drops or pills). °· Occasionally, the iritis will be so severe that it will not respond to commonly used medications. If this happens, it may be necessary to use steroid injections. The injections are given under the eye's outer surface. Sometimes oral medications are given. The decision on treatment used for iritis is usually made on an individual basis. °HOME CARE INSTRUCTIONS °Your care giver will give specific instructions regarding the use of eye medications or other medications. Be certain to follow all instructions in both taking and stopping the medications. °SEEK IMMEDIATE MEDICAL CARE IF: °· You have redness of one or both eye. °· You experience a great deal of light sensitivity. °· You have pain or aching in either eye. °MAKE SURE YOU:  °· Understand these instructions. °· Will watch your condition. °· Will get help right away if you are not doing well or get worse. °Document Released: 12/19/2004 Document Revised: 03/13/2011 Document   Reviewed: 06/08/2006 °ExitCare® Patient Information ©2014 [employer]. ° °

## 2012-12-09 ENCOUNTER — Ambulatory Visit (INDEPENDENT_AMBULATORY_CARE_PROVIDER_SITE_OTHER): Payer: Managed Care, Other (non HMO) | Admitting: Family Medicine

## 2012-12-09 ENCOUNTER — Encounter: Payer: Self-pay | Admitting: Family Medicine

## 2012-12-09 VITALS — BP 124/84 | HR 113 | Temp 98.0°F | Resp 16 | Ht 72.5 in | Wt 218.0 lb

## 2012-12-09 DIAGNOSIS — E1165 Type 2 diabetes mellitus with hyperglycemia: Secondary | ICD-10-CM

## 2012-12-09 DIAGNOSIS — IMO0001 Reserved for inherently not codable concepts without codable children: Secondary | ICD-10-CM

## 2012-12-09 DIAGNOSIS — L089 Local infection of the skin and subcutaneous tissue, unspecified: Secondary | ICD-10-CM

## 2012-12-09 DIAGNOSIS — R339 Retention of urine, unspecified: Secondary | ICD-10-CM

## 2012-12-09 DIAGNOSIS — E119 Type 2 diabetes mellitus without complications: Secondary | ICD-10-CM

## 2012-12-09 DIAGNOSIS — N529 Male erectile dysfunction, unspecified: Secondary | ICD-10-CM

## 2012-12-09 DIAGNOSIS — N200 Calculus of kidney: Secondary | ICD-10-CM

## 2012-12-09 DIAGNOSIS — IMO0002 Reserved for concepts with insufficient information to code with codable children: Secondary | ICD-10-CM

## 2012-12-09 LAB — COMPREHENSIVE METABOLIC PANEL
ALT: 24 U/L (ref 0–53)
AST: 16 U/L (ref 0–37)
Albumin: 4 g/dL (ref 3.5–5.2)
Alkaline Phosphatase: 100 U/L (ref 39–117)
BUN: 17 mg/dL (ref 6–23)
CO2: 23 mEq/L (ref 19–32)
Calcium: 9.5 mg/dL (ref 8.4–10.5)
Chloride: 99 mEq/L (ref 96–112)
Creat: 1.05 mg/dL (ref 0.50–1.35)
Glucose, Bld: 448 mg/dL — ABNORMAL HIGH (ref 70–99)
Potassium: 4.7 mEq/L (ref 3.5–5.3)
Sodium: 132 mEq/L — ABNORMAL LOW (ref 135–145)
Total Bilirubin: 0.5 mg/dL (ref 0.3–1.2)
Total Protein: 6.8 g/dL (ref 6.0–8.3)

## 2012-12-09 LAB — MICROALBUMIN, URINE: Microalb, Ur: 24.84 mg/dL — ABNORMAL HIGH (ref 0.00–1.89)

## 2012-12-09 LAB — POCT GLYCOSYLATED HEMOGLOBIN (HGB A1C): Hemoglobin A1C: >14

## 2012-12-09 MED ORDER — CEPHALEXIN 500 MG PO CAPS: 500.0000 mg | ORAL_CAPSULE | Freq: Three times a day (TID) | ORAL | Status: DC

## 2012-12-09 MED ORDER — SITAGLIPTIN PHOS-METFORMIN HCL 50-500 MG PO TABS: 1.0000 | ORAL_TABLET | Freq: Two times a day (BID) | ORAL | Status: DC

## 2012-12-09 MED ORDER — METFORMIN HCL 500 MG PO TABS: ORAL_TABLET | ORAL | Status: DC

## 2012-12-09 MED ORDER — INSULIN DETEMIR 100 UNIT/ML FLEXPEN: 20.0000 [IU] | Freq: Every day | SUBCUTANEOUS | Status: DC

## 2012-12-09 MED ORDER — SILVER SULFADIAZINE 1 % EX CREA: 1.0000 "application " | TOPICAL_CREAM | Freq: Every day | CUTANEOUS | Status: DC

## 2012-12-09 NOTE — Progress Notes (Addendum)
Patient ID: Russell [name], male   DOB: Apr 20, 1970, 42 y.o.   MRN: [id number] This chart was scribed for [name], MD by Caryn Bee, Medical Scribe. This patient was seen in Room/bed 13 and the patient's care was started at 9:49 AM.   Patient ID: Russell [name] MRN: [id number], DOB: 1970-07-14, 42 y.o. Date of Encounter: 12/09/2012, 9:49 AM  Primary Physician: No primary provider on file.  Chief Complaint: blisters  HPI: 42 y.o. year old male with history below presents with gradual onset blisters on bilateral heels of feet onset 5 days. Pt states that the blisters were caused by his work boots that were too small. He reports keeping the blisters cleaned and covered by bandaids. Pt has started wearing different boots for work that cause him less discomfort. Pt denies allergies. He also has h/o DM. Pt states it is not controlled currently. His blood glucose levels sometimes peak in the 300s.   Pt is requesting urologist referral. He states that a few weeks ago he passed a kidney stone and had hematuria. Pt also reports that he is urinating less frequently.  His wife currently has an ear infection.    Past Medical History  Diagnosis Date  . Diabetes mellitus without complication      Home Meds: Prior to Admission medications   Medication Sig Start Date End Date Taking? Authorizing Provider  ALPRAZolam Prudy Feeler) 0.5 MG tablet Take 1 tablet (0.5 mg total) by mouth 2 (two) times daily as needed for sleep. 08/28/12  Yes [name], MD  cyclobenzaprine (FLEXERIL) 10 MG tablet Take 1 tablet (10 mg total) by mouth at bedtime as needed for muscle spasms. 08/28/12  Yes [name], MD  gabapentin (NEURONTIN) 600 MG tablet Take 1 tablet (600 mg total) by mouth 2 (two) times daily. 12/19/11  Yes [name], MD  ibuprofen (ADVIL,MOTRIN) 800 MG tablet Take 1 tablet (800 mg total) by mouth every 8 (eight) hours as needed for pain. 12/19/11  Yes [name], MD  metFORMIN  (GLUCOPHAGE) 500 MG tablet Two bid 08/28/12  Yes [name], MD  prednisoLONE acetate (PRED FORTE) 1 % ophthalmic suspension Place 1 drop into the left eye 3 (three) times daily. 08/28/12  Yes [name], MD  simvastatin (ZOCOR) 40 MG tablet Take 1 tablet (40 mg total) by mouth at bedtime. 05/15/12  Yes [name], MD  sitaGLIPtan-metformin (JANUMET) 50-500 MG per tablet Take 1 tablet by mouth 2 (two) times daily with a meal. 05/15/12  Yes [name], MD  tobramycin (TOBREX) 0.3 % ophthalmic solution Place 1 drop into the left eye 3 (three) times daily. 08/28/12  Yes [name], MD    Allergies: No Known Allergies  History   Social History  . Marital Status: Single    Spouse Name: N/A    Number of Children: N/A  . Years of Education: N/A   Occupational History  . Not on file.   Social History Main Topics  . Smoking status: Never Smoker   . Smokeless tobacco: Not on file  . Alcohol Use: Not on file  . Drug Use: No  . Sexual Activity: Yes   Other Topics Concern  . Not on file   Social History Narrative  . No narrative on file     Review of Systems: Constitutional: negative for chills, fever, night sweats, weight changes, or fatigue  HEENT: negative for vision changes, hearing loss, congestion, rhinorrhea, ST, epistaxis, or sinus pressure Cardiovascular: negative for chest pain or palpitations  Respiratory: negative for hemoptysis, wheezing, shortness of breath, or cough Abdominal: negative for abdominal pain, nausea, vomiting, diarrhea, or constipation Dermatological: positive for blistered areas on bilateral heels Neurologic: negative for headache, dizziness, or syncope All other systems reviewed and are otherwise negative with the exception to those above and in the HPI.   Physical Exam: Blood pressure 124/84, pulse 113, temperature 98 F (36.7 C), temperature source Oral, resp. rate 16, height 6' 0.5" (1.842 m), weight 218 lb (98.884 kg), SpO2  100.00%., Body mass index is 29.14 kg/(m^2). General: Well developed, well nourished, in no acute distress. Head: Normocephalic, atraumatic, eyes without discharge, sclera non-icteric, nares are without discharge. Bilateral auditory canals clear, TM's are without perforation, pearly grey and translucent with reflective cone of light bilaterally. Oral cavity moist, posterior pharynx without exudate, erythema, peritonsillar abscess, or post nasal drip.  Neck: Supple. No thyromegaly. Full ROM. No lymphadenopathy. Lungs: Clear bilaterally to auscultation without wheezes, rales, or rhonchi. Breathing is unlabored. Heart: RRR with S1 S2. No murmurs, rubs, or gallops appreciated. Abdomen: Soft, non-tender, non-distended with normoactive bowel sounds. No hepatomegaly. No rebound/guarding. No obvious abdominal masses. Msk:  Strength and tone normal for age. Extremities/Skin: Warm and dry. No clubbing or cyanosis. No edema. No rashes or suspicious lesions. Neuro: Alert and oriented X 3. Moves all extremities spontaneously. Gait is normal. CNII-XII grossly in tact except the patient has slightly slurred speech and left facial weakness. His sensation in his toes is diminished. Psych:  Responds to questions appropriately with a normal affect.   Labs: Results for orders placed in visit on 12/09/12  POCT GLYCOSYLATED HEMOGLOBIN (HGB A1C)      Result Value Range   Hemoglobin A1C >14.0        ASSESSMENT AND PLAN:  42 y.o. year old male with healed blisters, diabetes which is not well controlled Blisters of multiple sites, infected - Plan: Leppert sulfADIAZINE (SILVADENE) 1 % cream, Apply dressing, cephALEXin (KEFLEX) 500 MG capsule, Wound culture  Type 2 diabetes mellitus - Plan: POCT glycosylated hemoglobin (Hb A1C), Comprehensive metabolic panel, Microalbumin, urine, Ambulatory referral to diabetic education, insulin detemir (LEVEMIR) 100 unit/ml SOLN  Diabetes type 2, uncontrolled - Plan:  sitaGLIPtin-metformin (JANUMET) 50-500 MG per tablet, metFORMIN (GLUCOPHAGE) 500 MG tablet  Urinary retention - Plan: Ambulatory referral to Urology  Kidney stones - Plan: Ambulatory referral to Urology  Erectile dysfunction - Plan: Ambulatory referral to Urology  Followup one week on the blisters  Signed, [name], MD 12/09/2012 9:49 AM

## 2012-12-11 LAB — WOUND CULTURE
Gram Stain: NONE SEEN
Gram Stain: NONE SEEN
Gram Stain: NONE SEEN

## 2013-01-23 ENCOUNTER — Ambulatory Visit (INDEPENDENT_AMBULATORY_CARE_PROVIDER_SITE_OTHER): Payer: Managed Care, Other (non HMO) | Admitting: Emergency Medicine

## 2013-01-23 VITALS — BP 128/82 | HR 106 | Temp 98.2°F | Resp 17 | Ht 72.0 in | Wt 228.0 lb

## 2013-01-23 DIAGNOSIS — R05 Cough: Secondary | ICD-10-CM

## 2013-01-23 DIAGNOSIS — R059 Cough, unspecified: Secondary | ICD-10-CM

## 2013-01-23 DIAGNOSIS — J111 Influenza due to unidentified influenza virus with other respiratory manifestations: Secondary | ICD-10-CM

## 2013-01-23 DIAGNOSIS — E119 Type 2 diabetes mellitus without complications: Secondary | ICD-10-CM

## 2013-01-23 DIAGNOSIS — J029 Acute pharyngitis, unspecified: Secondary | ICD-10-CM

## 2013-01-23 DIAGNOSIS — R509 Fever, unspecified: Secondary | ICD-10-CM

## 2013-01-23 LAB — POCT CBC
Granulocyte percent: 66.8 %G (ref 37–80)
HCT, POC: 46.9 % (ref 43.5–53.7)
Hemoglobin: 15.3 g/dL (ref 14.1–18.1)
LYMPH, POC: 1.6 (ref 0.6–3.4)
MCH: 29.5 pg (ref 27–31.2)
MCHC: 32.6 g/dL (ref 31.8–35.4)
MCV: 90.4 fL (ref 80–97)
MID (cbc): 0.4 (ref 0–0.9)
MPV: 9.6 fL (ref 0–99.8)
POC GRANULOCYTE: 4.1 (ref 2–6.9)
POC LYMPH %: 27 % (ref 10–50)
POC MID %: 6.2 % (ref 0–12)
Platelet Count, POC: 249 10*3/uL (ref 142–424)
RBC: 5.19 M/uL (ref 4.69–6.13)
RDW, POC: 13 %
WBC: 6.1 10*3/uL (ref 4.6–10.2)

## 2013-01-23 LAB — POCT INFLUENZA A/B
Influenza A, POC: NEGATIVE
Influenza B, POC: NEGATIVE

## 2013-01-23 LAB — POCT RAPID STREP A (OFFICE): Rapid Strep A Screen: NEGATIVE

## 2013-01-23 LAB — GLUCOSE, POCT (MANUAL RESULT ENTRY): POC GLUCOSE: 368 mg/dL — AB (ref 70–99)

## 2013-01-23 MED ORDER — OSELTAMIVIR PHOSPHATE 75 MG PO CAPS: 75.0000 mg | ORAL_CAPSULE | Freq: Two times a day (BID) | ORAL | Status: DC

## 2013-01-23 NOTE — Progress Notes (Addendum)
Subjective:    Patient ID: Russell [name], male    DOB: 1970-09-21, 43 y.o.   MRN: [id number]  HPI This chart was scribed for Russell [name]-MD, by Ladona Ridgel Day, Scribe. This patient was seen in room 3 and the patient's care was started at 10:18 AM.  HPI Comments: Russell [name] is a 43 y.o. male who presents to the [facility] complaining of constant, gradually worsening flu like symptoms, onset 2 days ago. He reports that he first began w/a HA two days ago and has since developed with myalgias, fever, sore throat, chills, sinus congestion, night sweats and a mild cough. He had the flu shot this year. Sick contacts at home. He reports that his current sx feel like the flu.   Patient Active Problem List   Diagnosis Date Noted  . Type 2 diabetes mellitus 12/19/2011  . Hyperlipidemia 12/19/2011  . Varicose veins 12/19/2011  . Back pain, chronic 12/19/2011   Past Surgical History  Procedure Laterality Date  . Right ankle fx repair     No family history on file.  History   Social History  . Marital Status: Single    Spouse Name: N/A    Number of Children: N/A  . Years of Education: N/A   Occupational History  . Not on file.   Social History Main Topics  . Smoking status: Never Smoker   . Smokeless tobacco: Not on file  . Alcohol Use: Not on file  . Drug Use: No  . Sexual Activity: Yes   Other Topics Concern  . Not on file   Social History Narrative  . No narrative on file   No Known Allergies  Results for orders placed in visit on 12/09/12  WOUND CULTURE      Result Value Range   Culture Few STAPHYLOCOCCUS AUREUS     Gram Stain No WBC Seen     Gram Stain No Squamous Epithelial Cells Seen     Gram Stain No Organisms Seen     Organism ID, Bacteria STAPHYLOCOCCUS AUREUS    COMPREHENSIVE METABOLIC PANEL      Result Value Range   Sodium 132 (*) 135 - 145 mEq/L   Potassium 4.7  3.5 - 5.3 mEq/L   Chloride 99  96 - 112 mEq/L   CO2 23  19 - 32 mEq/L     Glucose, Bld 448 (*) 70 - 99 mg/dL   BUN 17  6 - 23 mg/dL   Creat 4.09  8.11 - 9.14 mg/dL   Total Bilirubin 0.5  0.3 - 1.2 mg/dL   Alkaline Phosphatase 100  39 - 117 U/L   AST 16  0 - 37 U/L   ALT 24  0 - 53 U/L   Total Protein 6.8  6.0 - 8.3 g/dL   Albumin 4.0  3.5 - 5.2 g/dL   Calcium 9.5  8.4 - 78.2 mg/dL  MICROALBUMIN, URINE      Result Value Range   Microalb, Ur 24.84 (*) 0.00 - 1.89 mg/dL  POCT GLYCOSYLATED HEMOGLOBIN (HGB A1C)      Result Value Range   Hemoglobin A1C >14.0     Review of Systems  Constitutional: Positive for fever and chills.  HENT: Positive for congestion and sore throat.   Respiratory: Positive for cough. Negative for shortness of breath.   Cardiovascular: Negative for chest pain.  Gastrointestinal: Negative for nausea, vomiting, abdominal pain and diarrhea.  Musculoskeletal: Positive for myalgias. Negative for back  pain.  Neurological: Positive for headaches.      Objective:   Physical Exam Nursing note and vitals reviewed. Constitutional: Patient is oriented to person, place, and time. Patient appears well-developed and well-nourished. No distress. Patient appears ill but nontoxic.  HENT: Throat is slightly red nose is congested TMs are clear. Head: Normocephalic and atraumatic.  Neck: Neck supple. No tracheal deviation present.  Cardiovascular: Normal rate, regular rhythm and normal heart sounds.   No murmur heard. Pulmonary/Chest: Effort normal and breath sounds normal. No respiratory distress. Patient has no wheezes. Patient has no rales.  Musculoskeletal: Normal range of motion.  Neurological: Patient is alert and oriented to person, place, and time.  Skin: Skin is warm and dry.  Psychiatric: Patient has a normal mood and affect. Patient's behavior is normal.  Results for orders placed in visit on 01/23/13  POCT CBC      Result Value Range   WBC 6.1  4.6 - 10.2 K/uL   Lymph, poc 1.6  0.6 - 3.4   POC LYMPH PERCENT 27.0  10 - 50 %L   MID  (cbc) 0.4  0 - 0.9   POC MID % 6.2  0 - 12 %M   POC Granulocyte 4.1  2 - 6.9   Granulocyte percent 66.8  37 - 80 %G   RBC 5.19  4.69 - 6.13 M/uL   Hemoglobin 15.3  14.1 - 18.1 g/dL   HCT, POC 09.846.9  11.943.5 - 53.7 %   MCV 90.4  80 - 97 fL   MCH, POC 29.5  27 - 31.2 pg   MCHC 32.6  31.8 - 35.4 g/dL   RDW, POC 14.713.0     Platelet Count, POC 249  142 - 424 K/uL   MPV 9.6  0 - 99.8 fL  POCT INFLUENZA A/B      Result Value Range   Influenza A, POC Negative     Influenza B, POC Negative    POCT RAPID STREP A (OFFICE)      Result Value Range   Rapid Strep A Screen Negative  Negative  GLUCOSE, POCT (MANUAL RESULT ENTRY)      Result Value Range   POC Glucose 368 (*) 70 - 99 mg/dl   Triage Vitals: BP 829/56128/82  Pulse 106  Temp(Src) 98.2 F (36.8 C) (Oral)  Resp 17  Ht 6' (1.829 m)  Wt 228 lb (103.42 kg)  BMI 30.92 kg/m2  SpO2 99%  DIAGNOSTIC STUDIES: Oxygen Saturation is 99% on room air, normal by my interpretation.    COORDINATION OF CARE: At 1005 PM Discussed treatment plan with patient which includes blood work, CBG, flu swab and strept screen. Patient agrees.      Assessment & Plan:  Flu test is negative but he has all flu symptoms. We'll take him out of work until Monday and treat with Tamiflu 75 twice a day I personally performed the services described in this documentation, which was scribed in my presence. The recorded information has been reviewed and is accurate.

## 2013-01-25 LAB — CULTURE, GROUP A STREP: ORGANISM ID, BACTERIA: NORMAL

## 2013-01-28 ENCOUNTER — Encounter: Payer: Self-pay | Admitting: Family Medicine

## 2013-01-28 ENCOUNTER — Ambulatory Visit (INDEPENDENT_AMBULATORY_CARE_PROVIDER_SITE_OTHER): Payer: Managed Care, Other (non HMO) | Admitting: Family Medicine

## 2013-01-28 VITALS — BP 152/96 | HR 111 | Temp 98.4°F | Resp 18 | Ht 71.25 in | Wt 227.2 lb

## 2013-01-28 DIAGNOSIS — E119 Type 2 diabetes mellitus without complications: Secondary | ICD-10-CM

## 2013-01-28 DIAGNOSIS — E1165 Type 2 diabetes mellitus with hyperglycemia: Secondary | ICD-10-CM

## 2013-01-28 DIAGNOSIS — E11319 Type 2 diabetes mellitus with unspecified diabetic retinopathy without macular edema: Secondary | ICD-10-CM

## 2013-01-28 DIAGNOSIS — M549 Dorsalgia, unspecified: Secondary | ICD-10-CM

## 2013-01-28 DIAGNOSIS — E785 Hyperlipidemia, unspecified: Secondary | ICD-10-CM

## 2013-01-28 DIAGNOSIS — IMO0002 Reserved for concepts with insufficient information to code with codable children: Secondary | ICD-10-CM

## 2013-01-28 DIAGNOSIS — I10 Essential (primary) hypertension: Secondary | ICD-10-CM

## 2013-01-28 MED ORDER — ALPRAZOLAM 0.5 MG PO TABS: 0.5000 mg | ORAL_TABLET | Freq: Two times a day (BID) | ORAL | Status: DC | PRN

## 2013-01-28 MED ORDER — LISINOPRIL 10 MG PO TABS: 10.0000 mg | ORAL_TABLET | Freq: Every day | ORAL | Status: DC

## 2013-01-28 MED ORDER — IBUPROFEN 800 MG PO TABS: 800.0000 mg | ORAL_TABLET | Freq: Three times a day (TID) | ORAL | Status: DC | PRN

## 2013-01-28 MED ORDER — CYCLOBENZAPRINE HCL 10 MG PO TABS: 10.0000 mg | ORAL_TABLET | Freq: Every evening | ORAL | Status: DC | PRN

## 2013-01-28 MED ORDER — SIMVASTATIN 40 MG PO TABS: 40.0000 mg | ORAL_TABLET | Freq: Every day | ORAL | Status: DC

## 2013-01-28 MED ORDER — INSULIN LISPRO 100 UNIT/ML (KWIKPEN): PEN_INJECTOR | SUBCUTANEOUS | Status: DC

## 2013-01-28 MED ORDER — METFORMIN HCL 500 MG PO TABS: ORAL_TABLET | ORAL | Status: DC

## 2013-01-28 MED ORDER — SITAGLIPTIN PHOS-METFORMIN HCL 50-500 MG PO TABS: 1.0000 | ORAL_TABLET | Freq: Two times a day (BID) | ORAL | Status: DC

## 2013-01-28 NOTE — Progress Notes (Signed)
This chart was scribed for [name], MD by Quintella Reichert, ED scribe.  This patient was seen in [facility] Room 24 and the patient's care was started at 3:16 PM.   Patient ID: Russell [name] MRN: [id number], DOB: [birth year], 43 y.o. Date of Encounter: 01/28/2013, 3:37 PM  Primary Physician: No primary provider on file.  Chief Complaint: Diabetes  HPI: 43 y.o. year old male with history below presents to discuss his type 2 diabetes mellitus.  Pt uses insulin and metformin.  He was placed on insulin one week ago.  Currently he is requesting a prescription for Humalog in addition, to use before large meals.  He checks his sugar 6 times/day including immediately before eating and states it has been between 130-165.  He denies issues with hypoglycemia.  He does note frequent numbness and tingling in his feet and occasional numbness in his fingers.  He states he is eating a healthy diet.  He also needs a refill for his diabetic supplies and needles.  His last POC glucose was 368 when it was checked 5 days ago.  He had an appointment with a retina specialist 4 days ago who informed him that he has glaucoma and he needs surgery promptly.  He has surgery scheduled for both eyes within the next month.  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  He was also told that he should take an ACE inhibitor.    He saw a urologist recently and has another appointment in 3 days.  He is scheduled to receive an [place] on his bladder.   He has been working long hours recently.   Past Medical History  Diagnosis Date  . Diabetes mellitus without complication      Home Meds: Prior to Admission medications   Medication Sig Start Date End Date Taking? Authorizing Provider  ALPRAZolam Prudy Feeler) 0.5 MG tablet Take 1 tablet (0.5 mg total) by mouth 2 (two) times daily as needed for sleep. 08/28/12  Yes  [name], MD  cephALEXin (KEFLEX) 500 MG capsule Take 1 capsule (500 mg total) by mouth 3 (three) times daily. 12/09/12  Yes [name], MD  cyclobenzaprine (FLEXERIL) 10 MG tablet Take 1 tablet (10 mg total) by mouth at bedtime as needed for muscle spasms. 08/28/12  Yes [name], MD  gabapentin (NEURONTIN) 600 MG tablet Take 1 tablet (600 mg total) by mouth 2 (two) times daily. 12/19/11  Yes [name], MD  ibuprofen (ADVIL,MOTRIN) 800 MG tablet Take 1 tablet (800 mg total) by mouth every 8 (eight) hours as needed for pain. 12/19/11  Yes [name], MD  insulin detemir (LEVEMIR) 100 unit/ml SOLN Inject 20 Units into the skin at bedtime. 12/09/12  Yes [name], MD  metFORMIN (GLUCOPHAGE) 500 MG tablet Two bid 12/09/12  Yes [name], MD  simvastatin (ZOCOR) 40 MG tablet Take 1 tablet (40 mg total) by mouth at bedtime. 05/15/12  Yes [name], MD  sitaGLIPtin-metformin (JANUMET) 50-500 MG per tablet Take 1 tablet by mouth 2 (two) times daily with a meal. 12/09/12  Yes [name], MD    Allergies: No Known Allergies  History   Social History  . Marital Status: Single    Spouse Name: N/A    Number of Children: N/A  . Years of Education: N/A   Occupational History  . Not on file.  Social History Main Topics  . Smoking status: Never Smoker   . Smokeless tobacco: Not on file  . Alcohol Use: Not on file  . Drug Use: No  . Sexual Activity: Yes   Other Topics Concern  . Not on file   Social History Narrative  . No narrative on file     Review of Systems: Constitutional: negative for chills, fever, night sweats, weight changes, or fatigue  HEENT: negative for vision changes, hearing loss, congestion, rhinorrhea, ST, epistaxis, or sinus pressure Cardiovascular: negative for chest pain or palpitations Respiratory: negative for hemoptysis, wheezing, shortness of breath, or cough Abdominal: negative for abdominal pain, nausea, vomiting,  diarrhea, or constipation Dermatological: negative for rash Neurologic: positive for numbness and tingling in feet.  negative for headache, dizziness, or syncope All other systems reviewed and are otherwise negative with the exception to those above and in the HPI.   Physical Exam: Blood pressure 152/96, pulse 111, temperature 98.4 F (36.9 C), temperature source Oral, resp. rate 18, height 5' 11.25" (1.81 m), weight 227 lb 3.2 oz (103.057 kg), SpO2 97.00%., Body mass index is 31.46 kg/(m^2). General: Well developed, well nourished, in no acute distress. Head: Normocephalic, atraumatic. Multiple exudates both eyes.  Retina difficult to examine because of light reflex.  Pale optic discs. Sclera non-icteric, nares are without discharge. Bilateral auditory canals clear, TM's are without perforation, pearly grey and translucent with reflective cone of light bilaterally. Oral cavity moist, posterior pharynx without exudate, erythema, peritonsillar abscess, or post nasal drip.  Neck: Supple. No thyromegaly. Full ROM. No lymphadenopathy. Lungs: Clear bilaterally to auscultation without wheezes, rales, or rhonchi. Breathing is unlabored. Heart: RRR with S1 S2. No murmurs, rubs, or gallops appreciated. Abdomen: Soft, non-tender, non-distended with normoactive bowel sounds. No hepatomegaly. No rebound/guarding. No obvious abdominal masses. Msk:  Strength and tone normal for age. Extremities/Skin: Warm and dry. No clubbing or cyanosis. No edema. No rashes.  Bilateral achilles healing ulcerations, measuring 1.5-cm in diameter, with thick yellow crust.   Neuro: Alert and oriented X 3. Moves all extremities spontaneously. Gait is normal. CNII-XII grossly in tact.  Monofilament decreased sensation to bilateral dorsal feet symmetrically. Psych:  Responds to questions appropriately with a normal affect.   Labs:   ASSESSMENT AND PLAN:  43 y.o. year old male with Diabetes - Plan: lisinopril (PRINIVIL,ZESTRIL)  10 MG tablet, insulin lispro (HUMALOG KWIKPEN) 100 UNIT/ML KiwkPen  Back pain - Plan: ibuprofen (ADVIL,MOTRIN) 800 MG tablet, cyclobenzaprine (FLEXERIL) 10 MG tablet, ALPRAZolam (XANAX) 0.5 MG tablet  Diabetes type 2, uncontrolled - Plan: metFORMIN (GLUCOPHAGE) 500 MG tablet, sitaGLIPtin-metformin (JANUMET) 50-500 MG per tablet  Hyperlipidemia LDL goal <100 - Plan: simvastatin (ZOCOR) 40 MG tablet  Diabetic retinopathy follow up with retinal specialist Nutrition consult pending   Signed, Elvina SidleKurt Augustine Leverette, MD 01/28/2013 3:37 PM

## 2013-02-05 ENCOUNTER — Telehealth: Payer: Self-pay

## 2013-02-05 NOTE — Telephone Encounter (Signed)
Pharm sent fax advising that pt states he is on Humalog mix 50/50 kwik pen. Dr L, do you want to change Rx to this? I pended Rx but please check sig and quantity since not sure what you want to Rx.

## 2013-02-06 NOTE — Telephone Encounter (Signed)
I would just continue his Humalog 50/50 kwik pen at 3-5 units immediately following meal.  Please refill this for 3 months with 3 refills

## 2013-02-07 MED ORDER — INSULIN LISPRO PROT & LISPRO (50-50 MIX) 100 UNIT/ML KWIKPEN: PEN_INJECTOR | SUBCUTANEOUS | Status: DC

## 2013-02-13 ENCOUNTER — Ambulatory Visit: Payer: Managed Care, Other (non HMO)

## 2013-02-13 ENCOUNTER — Encounter: Payer: Self-pay | Admitting: Family Medicine

## 2013-02-13 ENCOUNTER — Ambulatory Visit (INDEPENDENT_AMBULATORY_CARE_PROVIDER_SITE_OTHER): Payer: Managed Care, Other (non HMO) | Admitting: Family Medicine

## 2013-02-13 VITALS — BP 140/100 | HR 114 | Temp 98.0°F | Resp 16 | Ht 71.5 in | Wt 226.0 lb

## 2013-02-13 DIAGNOSIS — M25532 Pain in left wrist: Secondary | ICD-10-CM

## 2013-02-13 DIAGNOSIS — S63509A Unspecified sprain of unspecified wrist, initial encounter: Secondary | ICD-10-CM

## 2013-02-13 DIAGNOSIS — R609 Edema, unspecified: Secondary | ICD-10-CM

## 2013-02-13 DIAGNOSIS — S63502A Unspecified sprain of left wrist, initial encounter: Secondary | ICD-10-CM

## 2013-02-13 DIAGNOSIS — R110 Nausea: Secondary | ICD-10-CM

## 2013-02-13 DIAGNOSIS — M25439 Effusion, unspecified wrist: Secondary | ICD-10-CM

## 2013-02-13 MED ORDER — HYDROCODONE-ACETAMINOPHEN 5-325 MG PO TABS: 1.0000 | ORAL_TABLET | Freq: Four times a day (QID) | ORAL | Status: DC | PRN

## 2013-02-13 NOTE — Progress Notes (Addendum)
Subjective:  This chart was scribed for Meredith Staggers, MD by Carl Best, Medical Scribe. This patient was seen in Room 2 and the patient's care was started at 9:48 AM.   Patient ID: Russell [name], male    DOB: 11-20-1970, 43 y.o.   MRN: [id number]  HPI HPI Comments: Russell [name] is a 43 y.o. male who presents to the [facility] complaining of constant nausea that started at 7:30 PM last night.  The patient denies having problems with nausea and vomiting when his sugars are up.  He states that his levels are down as of late.  He states that his sugar was 149 this morning and was 138 last night when his symptoms started.  He denies vomiting this morning.    The patient states that he had a foosh injury yesterday morning while walking through the threshold of his doorway and is complaining of constant right left pain.  The patient states that he applied ice to the area with no relief to his symptoms.  He also states that he has taken Motrin with no relief to his pain.    The patient has a history of DM Type 2 with retinopathy.  The patient takes Insulin and Metformin including Humalog 50/50 3-5 units with meals.  Last A1C was greater than 14 on December 09, 2012.   The patient's POC on January 29, 2013 was 368.     Patient Active Problem List   Diagnosis Date Noted  . Type 2 diabetes mellitus 12/19/2011  . Hyperlipidemia 12/19/2011  . Varicose veins 12/19/2011  . Back pain, chronic 12/19/2011   Past Medical History  Diagnosis Date  . Diabetes mellitus without complication    Past Surgical History  Procedure Laterality Date  . Right ankle fx repair     No Known Allergies Prior to Admission medications   Medication Sig Start Date End Date Taking? Authorizing Provider  ALPRAZolam Prudy Feeler) 0.5 MG tablet Take 1 tablet (0.5 mg total) by mouth 2 (two) times daily as needed for sleep. 01/28/13  Yes [name], MD  cyclobenzaprine (FLEXERIL) 10 MG tablet Take 1  tablet (10 mg total) by mouth at bedtime as needed for muscle spasms. 01/28/13  Yes [name], MD  gabapentin (NEURONTIN) 600 MG tablet Take 1 tablet (600 mg total) by mouth 2 (two) times daily. 12/19/11  Yes [name], MD  ibuprofen (ADVIL,MOTRIN) 800 MG tablet Take 1 tablet (800 mg total) by mouth every 8 (eight) hours as needed. 01/28/13  Yes [name], MD  insulin detemir (LEVEMIR) 100 unit/ml SOLN Inject 20 Units into the skin at bedtime. 12/09/12  Yes [name], MD  insulin lispro (HUMALOG KWIKPEN) 100 UNIT/ML KiwkPen Tid prn premeal blood sugar over 145. 01/28/13  Yes [name], MD  Insulin Lispro Prot & Lispro (HUMALOG 50/50 MIX) (50-50) 100 UNIT/ML Kwikpen Inject 3-5 units into skin TID prn immediately following meal . 02/05/13  Yes [name], MD  lisinopril (PRINIVIL,ZESTRIL) 10 MG tablet Take 1 tablet (10 mg total) by mouth daily. 01/28/13  Yes [name], MD  simvastatin (ZOCOR) 40 MG tablet Take 1 tablet (40 mg total) by mouth at bedtime. 01/28/13  Yes [name], MD  sitaGLIPtin-metformin (JANUMET) 50-500 MG per tablet Take 1 tablet by mouth 2 (two) times daily with a meal. 01/28/13  Yes [name], MD  metFORMIN (GLUCOPHAGE) 500 MG tablet Two bid 01/28/13   [name], MD   History   Social History  .  Marital Status: Single    Spouse Name: N/A    Number of Children: N/A  . Years of Education: N/A   Occupational History  . Not on file.   Social History Main Topics  . Smoking status: Never Smoker   . Smokeless tobacco: Not on file  . Alcohol Use: Not on file  . Drug Use: No  . Sexual Activity: Yes   Other Topics Concern  . Not on file   Social History Narrative  . No narrative on file     Review of Systems  Gastrointestinal: Positive for nausea.  Musculoskeletal: Positive for arthralgias (left wrist).  All other systems reviewed and are negative.      Objective:  Physical Exam  Vitals reviewed. Constitutional:  He is oriented to person, place, and time. He appears well-developed and well-nourished.  HENT:  Head: Normocephalic and atraumatic.  Right Ear: Tympanic membrane, external ear and ear canal normal.  Left Ear: Tympanic membrane, external ear and ear canal normal.  Nose: No rhinorrhea.  Mouth/Throat: Oropharynx is clear and moist and mucous membranes are normal. No oropharyngeal exudate or posterior oropharyngeal erythema.  Eyes: Conjunctivae are normal. Pupils are equal, round, and reactive to light.  Neck: Neck supple.  Cardiovascular: Normal rate, regular rhythm, normal heart sounds and intact distal pulses.  Exam reveals no gallop and no friction rub.   No murmur heard. Capillary refill is less than 1 second on the left two fingertips.    Pulmonary/Chest: Effort normal and breath sounds normal. He has no wheezes. He has no rhonchi. He has no rales.  Abdominal: Soft. There is no tenderness.  Musculoskeletal:  Left elbow is non tender with full range of motion.  Soft tissue swelling on the dorsum of the right wrist more noted on the ulnar rather than the radial side. Diffusely tender over the dorsal wrist and diffusely into the DRUJ.  Metacarpals are non-tender.  Decreased range of motion.  Specifically most decreased with extension but pronation and supination intact.   Lymphadenopathy:    He has no cervical adenopathy.  Neurological: He is alert and oriented to person, place, and time.  Skin: Skin is warm and dry. No rash noted.  Scar of the ventral aspect of his right wrist from a prior injury but no bony injury in the past.   Psychiatric: He has a normal mood and affect. His behavior is normal.   [facility] reading (PRIMARY) by  Dr. Neva SeatGreene: L wrist: no apparent fx. Soft tissue defect/scar volar wrist - prior skin surgery.  BP 140/100  Pulse 114  Temp(Src) 98 F (36.7 C) (Oral)  Resp 16  Ht 5' 11.5" (1.816 m)  Wt 226 lb (102.513 kg)  BMI 31.08 kg/m2  SpO2 98% Assessment & Plan:    Russell BirkenheadSteven B [name] is a 43 y.o. male Left wrist pain - Plan: DG Wrist Complete Left  Nausea alone  Wrist swelling  Sprain of wrist, left - Plan: HYDROcodone-acetaminophen (NORCO/VICODIN) 5-325 MG per tablet   Foosh injury of left wrist with swelling diffuse tenderness but is tender over scaphoid with apparent fracture on x-ray.  Spica splint placed for next 10-14 days with recheck exam and possible repeat x-ray.  Symptomatic care as below.  Lortab if needed but discussed to avoid this at work or if driving.  Suspect nausea due to pain although uncontrolled diabetes in the past, home blood sugars okay recently.  Advised if this persists with treatment of pain, needs to return to clinic for  eval.    Meds ordered this encounter  Medications  . HYDROcodone-acetaminophen (NORCO/VICODIN) 5-325 MG per tablet    Sig: Take 1 tablet by mouth every 6 (six) hours as needed for moderate pain.    Dispense:  20 tablet    Refill:  0   Patient Instructions  Continue wrist brace for the next 10-14 days then recheck for possible repeat x-ray if you are still sore over the wrist.  Avoid twisting or heavy lifting with the left wrist until recheck.  Ice as discussed.  Motrin or Tylenol over the counter if needed for pain or prescription medicine if needed for more severe pain.  If nausea  Not improved with pain relief, or any worsening  - return for recheck.    RICE: Routine Care for Injuries The routine care of many injuries includes Rest, Ice, Compression, and Elevation (RICE). HOME CARE INSTRUCTIONS  Rest is needed to allow your body to heal. Routine activities can usually be resumed when comfortable. Injured tendons and bones can take up to 6 weeks to heal. Tendons are the cord-like structures that attach muscle to bone.  Ice following an injury helps keep the swelling down and reduces pain.  Put ice in a plastic bag.  Place a towel between your skin and the bag.  Leave the ice on for 15-20  minutes, 03-04 times a day. Do this while awake, for the first 24 to 48 hours. After that, continue as directed by your caregiver.  Compression helps keep swelling down. It also gives support and helps with discomfort. If an elastic bandage has been applied, it should be removed and reapplied every 3 to 4 hours. It should not be applied tightly, but firmly enough to keep swelling down. Watch fingers or toes for swelling, bluish discoloration, coldness, numbness, or excessive pain. If any of these problems occur, remove the bandage and reapply loosely. Contact your caregiver if these problems continue.  Elevation helps reduce swelling and decreases pain. With extremities, such as the arms, hands, legs, and feet, the injured area should be placed near or above the level of the heart, if possible. SEEK IMMEDIATE MEDICAL CARE IF:  You have persistent pain and swelling.  You develop redness, numbness, or unexpected weakness.  Your symptoms are getting worse rather than improving after several days. These symptoms may indicate that further evaluation or further X-rays are needed. Sometimes, X-rays may not show a small broken bone (fracture) until 1 week or 10 days later. Make a follow-up appointment with your caregiver. Ask when your X-ray results will be ready. Make sure you get your X-ray results. Document Released: 04/02/2000 Document Revised: 03/13/2011 Document Reviewed: 05/20/2010 [facility] Patient Information 2014 [place], [place].        I personally performed the services described in this documentation, which was scribed in my presence. The recorded information has been reviewed and considered, and addended by me as needed.

## 2013-02-13 NOTE — Patient Instructions (Signed)
Continue wrist brace for the next 10-14 days then recheck for possible repeat x-ray if you are still sore over the wrist.  Avoid twisting or heavy lifting with the left wrist until recheck.  Ice as discussed.  Motrin or Tylenol over the counter if needed for pain or prescription medicine if needed for more severe pain.  If nausea  Not improved with pain relief, or any worsening  - return for recheck.    RICE: Routine Care for Injuries The routine care of many injuries includes Rest, Ice, Compression, and Elevation (RICE). HOME CARE INSTRUCTIONS  Rest is needed to allow your body to heal. Routine activities can usually be resumed when comfortable. Injured tendons and bones can take up to 6 weeks to heal. Tendons are the cord-like structures that attach muscle to bone.  Ice following an injury helps keep the swelling down and reduces pain.  Put ice in a plastic bag.  Place a towel between your skin and the bag.  Leave the ice on for 15-20 minutes, 03-04 times a day. Do this while awake, for the first 24 to 48 hours. After that, continue as directed by your caregiver.  Compression helps keep swelling down. It also gives support and helps with discomfort. If an elastic bandage has been applied, it should be removed and reapplied every 3 to 4 hours. It should not be applied tightly, but firmly enough to keep swelling down. Watch fingers or toes for swelling, bluish discoloration, coldness, numbness, or excessive pain. If any of these problems occur, remove the bandage and reapply loosely. Contact your caregiver if these problems continue.  Elevation helps reduce swelling and decreases pain. With extremities, such as the arms, hands, legs, and feet, the injured area should be placed near or above the level of the heart, if possible. SEEK IMMEDIATE MEDICAL CARE IF:  You have persistent pain and swelling.  You develop redness, numbness, or unexpected weakness.  Your symptoms are getting worse  rather than improving after several days. These symptoms may indicate that further evaluation or further X-rays are needed. Sometimes, X-rays may not show a small broken bone (fracture) until 1 week or 10 days later. Make a follow-up appointment with your caregiver. Ask when your X-ray results will be ready. Make sure you get your X-ray results. Document Released: 04/02/2000 Document Revised: 03/13/2011 Document Reviewed: 05/20/2010 [facility] Patient Information 2014 [place], [place].

## 2013-03-06 ENCOUNTER — Ambulatory Visit (INDEPENDENT_AMBULATORY_CARE_PROVIDER_SITE_OTHER): Payer: Managed Care, Other (non HMO) | Admitting: Family Medicine

## 2013-03-06 ENCOUNTER — Encounter: Payer: Self-pay | Admitting: Family Medicine

## 2013-03-06 VITALS — BP 131/84 | HR 98 | Temp 102.0°F | Resp 16 | Ht 72.0 in | Wt 232.0 lb

## 2013-03-06 DIAGNOSIS — E1149 Type 2 diabetes mellitus with other diabetic neurological complication: Secondary | ICD-10-CM

## 2013-03-06 DIAGNOSIS — E1140 Type 2 diabetes mellitus with diabetic neuropathy, unspecified: Secondary | ICD-10-CM

## 2013-03-06 DIAGNOSIS — E11319 Type 2 diabetes mellitus with unspecified diabetic retinopathy without macular edema: Secondary | ICD-10-CM

## 2013-03-06 DIAGNOSIS — L97509 Non-pressure chronic ulcer of other part of unspecified foot with unspecified severity: Secondary | ICD-10-CM

## 2013-03-06 DIAGNOSIS — E11621 Type 2 diabetes mellitus with foot ulcer: Secondary | ICD-10-CM

## 2013-03-06 DIAGNOSIS — E1169 Type 2 diabetes mellitus with other specified complication: Secondary | ICD-10-CM

## 2013-03-06 DIAGNOSIS — E119 Type 2 diabetes mellitus without complications: Secondary | ICD-10-CM

## 2013-03-06 LAB — POCT GLYCOSYLATED HEMOGLOBIN (HGB A1C): Hemoglobin A1C: 11.3

## 2013-03-06 MED ORDER — CANAGLIFLOZIN 100 MG PO TABS: 100.0000 mg | ORAL_TABLET | Freq: Every day | ORAL | Status: DC

## 2013-03-06 NOTE — Progress Notes (Addendum)
Subjective:  This chart was scribed for [name], MD by Carl Bestelina Holson, Medical Scribe. This patient was seen in Room 24 and the patient's care was started at 10:37 AM.   Patient ID: Russell [name], male    DOB: 1970/03/02, 43 y.o.   MRN: [id number]  HPI HPI Comments: Russell [name] is a 43 y.o. male with a history of DM without complication who presents to the [facility] needing a recheck of his DM.  He states that his blood sugar levels have been in the 200s lately and he has gained a lot of weight.  He states that he is eating salad, proteins, and exercising.  He denies eating sugary or fatty foods and drinking soda.  He states that he is still taking Humalog, Lisinopril Metformin, and 300 mL of Insulin.  He states that he had another laser treatment done to his eyes two weeks ago.  He states that his feet are still numb and there is still a dark spot on his right heel.  The patient states that work is going well.     Past Medical History  Diagnosis Date   Diabetes mellitus without complication    Past Surgical History  Procedure Laterality Date   Right ankle fx repair     No family history on file. History   Social History   Marital Status: Single    Spouse Name: N/A    Number of Children: N/A   Years of Education: N/A   Occupational History   Not on file.   Social History Main Topics   Smoking status: Never Smoker    Smokeless tobacco: Not on file   Alcohol Use: Not on file   Drug Use: No   Sexual Activity: Yes   Other Topics Concern   Not on file   Social History Narrative   No narrative on file   No Known Allergies  Review of Systems  Constitutional: Positive for unexpected weight change (weight gain).  All other systems reviewed and are negative.     Objective:  Physical Exam  Nursing note and vitals reviewed. Constitutional: He is oriented to person, place, and time. He appears well-developed and well-nourished.   HENT:  Head: Normocephalic and atraumatic.  Laser scars bilaterally on retina.  Some pallor of retina  Eyes: Conjunctivae and EOM are normal. Pupils are equal, round, and reactive to light. Right eye exhibits no discharge. Left eye exhibits no discharge. No scleral icterus.  Neck: Normal range of motion.  Cardiovascular: Normal rate, regular rhythm and normal heart sounds.  Exam reveals no gallop and no friction rub.   No murmur heard. Pulmonary/Chest: Effort normal and breath sounds normal. No respiratory distress. He has no wheezes. He has no rales.  Abdominal:  Healing 1 cm Achilles ulcer with black dry eschar.  Left achilles ulcer has healed.  Both feet numb.  Musculoskeletal: Normal range of motion.  Neurological: He is alert and oriented to person, place, and time.  Skin: Skin is warm and dry.  Psychiatric: He has a normal mood and affect. His behavior is normal.       BP 131/84   Pulse 98   Temp(Src) 102 F (38.9 C)   Resp 16   Ht 6' (1.829 m)   Wt 232 lb (105.235 kg)   BMI 31.46 kg/m2   SpO2 98% Assessment & Plan:    I personally performed the services described in this documentation, which was scribed in  my presence. The recorded information has been reviewed and is accurate.  Obviously the increased weight and both retinopathy and neuropathy are of great concern. Despite what sounds like appearance to his diet and nutrition consult, for not making progress. For this reason I think an endocrine referral is vital. Will try to help him with his weight gain by switching from Janumet to Invokana.  Recheck one month Diabetes - Plan: [facility] Diabetes Foot Exam, POCT glycosylated hemoglobin (Hb A1C), Ambulatory referral to Endocrinology, Canagliflozin (INVOKANA) 100 MG TABS  Diabetic retinopathy  Diabetic neuropathy  Ulcer of foot due to diabetes  Signed, [name], MD

## 2013-03-11 ENCOUNTER — Ambulatory Visit (INDEPENDENT_AMBULATORY_CARE_PROVIDER_SITE_OTHER): Payer: Managed Care, Other (non HMO) | Admitting: Endocrinology

## 2013-03-11 ENCOUNTER — Encounter: Payer: Self-pay | Admitting: Endocrinology

## 2013-03-11 VITALS — BP 132/90 | HR 90 | Temp 97.7°F | Ht 72.0 in | Wt 240.0 lb

## 2013-03-11 DIAGNOSIS — F411 Generalized anxiety disorder: Secondary | ICD-10-CM

## 2013-03-11 MED ORDER — INSULIN LISPRO 100 UNIT/ML (KWIKPEN): 20.0000 [IU] | PEN_INJECTOR | Freq: Three times a day (TID) | SUBCUTANEOUS | Status: DC

## 2013-03-11 NOTE — Patient Instructions (Addendum)
good diet and exercise habits significanly improve the control of your diabetes.  please let me know if you wish to be referred to a dietician.  high blood sugar is very risky to your health.  you should see an eye doctor every year.  You are at higher than average risk for pneumonia and hepatitis-B.  You should be vaccinated against both.   controlling your blood pressure and cholesterol drastically reduces the damage diabetes does to your body.  this also applies to quitting smoking.  please discuss these with your doctor.  check your blood sugar 4 times a day: before the 3 meals, and at bedtime.  also check if you have symptoms of your blood sugar being too high or too low.  please keep a record of the readings and bring it to your next appointment here.  You can write it on any piece of paper.  please call us sooner if your blood sugar goes below 70, or if you have a lot of readings over 200. Please stop the levemir, invokana, and 50/50 insulin, and: Take the humalog, 20 units 3 times a day (just before each meal). Please come back for a follow-up appointment in 2 weeks.   we will need to take this complex situation in stages.

## 2013-03-11 NOTE — Progress Notes (Signed)
Subjective:    Patient ID: Russell [name], male    DOB: Sep 15, 1970, 43 y.o.   MRN: [id number]  HPI pt states DM was dx'ed in 2010, when he weighed 280 lbs; he has moderate neuropathy of the lower extremities, and associated retinopathy.  he has been on insulin since early 2015, after a secondary failure of oral agents.  pt says his diet and exercise are both good.  Pt says cbg's are consistently over 200.  He takes a total of 35 units of insulin per day.  He works 3rd shift.  Past Medical History  Diagnosis Date  . Diabetes mellitus without complication     Past Surgical History  Procedure Laterality Date  . Right ankle fx repair      History   Social History  . Marital Status: Single    Spouse Name: N/A    Number of Children: N/A  . Years of Education: N/A   Occupational History  . Not on file.   Social History Main Topics  . Smoking status: Never Smoker   . Smokeless tobacco: Not on file  . Alcohol Use: Not on file  . Drug Use: No  . Sexual Activity: Yes   Other Topics Concern  . Not on file   Social History Narrative  . No narrative on file    Current Outpatient Prescriptions on File Prior to Visit  Medication Sig Dispense Refill  . ALPRAZolam (XANAX) 0.5 MG tablet Take 1 tablet (0.5 mg total) by mouth 2 (two) times daily as needed for sleep.  60 tablet  5  . Canagliflozin (INVOKANA) 100 MG TABS Take 1 tablet (100 mg total) by mouth daily.  30 tablet  3  . cyclobenzaprine (FLEXERIL) 10 MG tablet Take 1 tablet (10 mg total) by mouth at bedtime as needed for muscle spasms.  30 tablet  11  . gabapentin (NEURONTIN) 600 MG tablet Take 1 tablet (600 mg total) by mouth 2 (two) times daily.  60 tablet  11  . HYDROcodone-acetaminophen (NORCO/VICODIN) 5-325 MG per tablet Take 1 tablet by mouth every 6 (six) hours as needed for moderate pain.  20 tablet  0  . ibuprofen (ADVIL,MOTRIN) 800 MG tablet Take 1 tablet (800 mg total) by mouth every 8 (eight) hours as needed.  90  tablet  11  . insulin detemir (LEVEMIR) 100 unit/ml SOLN Inject 30 Units into the skin at bedtime.      . insulin lispro (HUMALOG KWIKPEN) 100 UNIT/ML KiwkPen Tid prn premeal blood sugar over 145.  15 mL  11  . Insulin Lispro Prot & Lispro (HUMALOG 50/50 MIX) (50-50) 100 UNIT/ML Kwikpen Inject 3-5 units into skin TID prn immediately following meal .  15 mL  3  . lisinopril (PRINIVIL,ZESTRIL) 10 MG tablet Take 1 tablet (10 mg total) by mouth daily.  90 tablet  3  . metFORMIN (GLUCOPHAGE) 500 MG tablet Two bid  180 tablet  3  . simvastatin (ZOCOR) 40 MG tablet Take 1 tablet (40 mg total) by mouth at bedtime.  90 tablet  3   No current facility-administered medications on file prior to visit.   No Known Allergies  No family history on file. DM: father BP 132/90  Pulse 90  Temp(Src) 97.7 F (36.5 C) (Oral)  Ht 6' (1.829 m)  Wt 240 lb (108.863 kg)  BMI 32.54 kg/m2  SpO2 96%  Review of Systems denies headache, chest pain, hypoglycemia, sob, n/v, urinary frequency, cramps, excessive diaphoresis, memory loss, cold  intolerance, and rhinorrhea.  He has easy bruising, depression, and blurry vision.  He has regained some of the weight he lost.    Objective:   Physical Exam VS: see vs page GEN: no distress HEAD: head: no deformity eyes: no periorbital swelling, no proptosis external nose and ears are normal mouth: no lesion seen NECK: supple, thyroid is not enlarged CHEST WALL: no deformity LUNGS: clear to auscultation BREASTS:  No gynecomastia CV: reg rate and rhythm, no murmur ABD: abdomen is soft, nontender.  no hepatosplenomegaly.  not distended.  no hernia MUSCULOSKELETAL: muscle bulk and strength are grossly normal.  no obvious joint swelling.  gait is normal and steady PULSES: no carotid bruit NEURO:  cn 2-12 grossly intact.   readily moves all 4's.   SKIN:  Normal texture and temperature.  No rash or suspicious lesion is visible.   NODES:  None palpable at the neck PSYCH:  alert, well-oriented.  Does not appear anxious nor depressed. Lab Results  Component Value Date   HGBA1C 11.3 03/06/2013      Assessment & Plan:  DM: poor control: this causes very high risk to his health. Weight loss: due to severe hyperglycemia. Occupational status (3rd shift): this complicates the rx of DM. Anxiety/depression: these also complicate the rx of DM.

## 2013-03-25 ENCOUNTER — Encounter: Payer: Self-pay | Admitting: Endocrinology

## 2013-03-25 ENCOUNTER — Ambulatory Visit (INDEPENDENT_AMBULATORY_CARE_PROVIDER_SITE_OTHER): Payer: Managed Care, Other (non HMO) | Admitting: Endocrinology

## 2013-03-25 VITALS — BP 128/86 | HR 97 | Temp 98.6°F | Ht 72.0 in | Wt 235.0 lb

## 2013-03-25 DIAGNOSIS — E119 Type 2 diabetes mellitus without complications: Secondary | ICD-10-CM

## 2013-03-25 MED ORDER — INSULIN LISPRO 100 UNIT/ML (KWIKPEN): 50.0000 [IU] | PEN_INJECTOR | Freq: Three times a day (TID) | SUBCUTANEOUS | Status: DC

## 2013-03-25 NOTE — Patient Instructions (Addendum)
check your blood sugar 4 times a day: before the 3 meals, and at bedtime.  also check if you have symptoms of your blood sugar being too high or too low.  please keep a record of the readings and bring it to your next appointment here.  You can write it on any piece of paper.  please call us sooner if your blood sugar goes below 70, or if you have a lot of readings over 200. Take the humalog, 50 units 3 times a day (just before each meal).  novolog and apidra are similar insulins to what you take.  If you get a better price on one of these, let me know.   If all of these are expensive, it would be cheaper to take regular insulin, and draw it up from the bottle.  Please come back for a follow-up appointment in 1 month. we will need to take this complex situation in stages.

## 2013-03-25 NOTE — Progress Notes (Signed)
   Subjective:    Patient ID: Russell BirkenheadSteven B Nokes, male    DOB: 10-12-1970, 43 y.o.   MRN: [id number]  HPI pt returns for f/u of insulin-requiring DM (dx'ed 2010, when he weighed 280 lbs; he has moderate neuropathy of the lower extremities, and associated retinopathy; he has been on insulin since early 2015, after a secondary failure of oral agents).  Due to hyperglycemia, he has increased humalog to 40 units tid.  no cbg record, but states cbg's still vary from 250-400.  It is in general higher after eating, than if he hasn't eaten in some hours.   Past Medical History  Diagnosis Date  . Diabetes mellitus without complication     Past Surgical History  Procedure Laterality Date  . Right ankle fx repair      History   Social History  . Marital Status: Single    Spouse Name: N/A    Number of Children: N/A  . Years of Education: N/A   Occupational History  . Not on file.   Social History Main Topics  . Smoking status: Never Smoker   . Smokeless tobacco: Not on file  . Alcohol Use: Not on file  . Drug Use: No  . Sexual Activity: Yes   Other Topics Concern  . Not on file   Social History Narrative  . No narrative on file    Current Outpatient Prescriptions on File Prior to Visit  Medication Sig Dispense Refill  . ALPRAZolam (XANAX) 0.5 MG tablet Take 1 tablet (0.5 mg total) by mouth 2 (two) times daily as needed for sleep.  60 tablet  5  . cyclobenzaprine (FLEXERIL) 10 MG tablet Take 1 tablet (10 mg total) by mouth at bedtime as needed for muscle spasms.  30 tablet  11  . gabapentin (NEURONTIN) 600 MG tablet Take 1 tablet (600 mg total) by mouth 2 (two) times daily.  60 tablet  11  . HYDROcodone-acetaminophen (NORCO/VICODIN) 5-325 MG per tablet Take 1 tablet by mouth every 6 (six) hours as needed for moderate pain.  20 tablet  0  . ibuprofen (ADVIL,MOTRIN) 800 MG tablet Take 1 tablet (800 mg total) by mouth every 8 (eight) hours as needed.  90 tablet  11  . lisinopril  (PRINIVIL,ZESTRIL) 10 MG tablet Take 1 tablet (10 mg total) by mouth daily.  90 tablet  3  . simvastatin (ZOCOR) 40 MG tablet Take 1 tablet (40 mg total) by mouth at bedtime.  90 tablet  3   No current facility-administered medications on file prior to visit.    No Known Allergies  No family history on file.  BP 128/86  Pulse 97  Temp(Src) 98.6 F (37 C) (Oral)  Ht 6' (1.829 m)  Wt 235 lb (106.595 kg)  BMI 31.86 kg/m2  SpO2 97%    Review of Systems He denies hypoglycemia.  He has gained weight.      Objective:   Physical Exam VITAL SIGNS:  See vs page GENERAL: no distress SKIN:  Insulin injection sites at the anterior abdomen are normal      Assessment & Plan:  DM: This insulin regimen was chosen from multiple options, as it best matches his insulin to his changing requirements throughout the day.  The benefits of glycemic control must be weighed against the risks of hypoglycemia.  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 Anxiety/depression: these also complicate the rx of DM.

## 2013-03-26 ENCOUNTER — Encounter (HOSPITAL_COMMUNITY): Payer: Self-pay | Admitting: Emergency Medicine

## 2013-03-26 ENCOUNTER — Emergency Department (HOSPITAL_COMMUNITY)
Admission: EM | Admit: 2013-03-26 | Discharge: 2013-03-27 | Disposition: A | Discharge status: Home / Self Care | Payer: Managed Care, Other (non HMO) | Attending: Emergency Medicine | Admitting: Emergency Medicine

## 2013-03-26 DIAGNOSIS — R739 Hyperglycemia, unspecified: Secondary | ICD-10-CM

## 2013-03-26 DIAGNOSIS — H539 Unspecified visual disturbance: Secondary | ICD-10-CM | POA: Insufficient documentation

## 2013-03-26 DIAGNOSIS — E860 Dehydration: Secondary | ICD-10-CM | POA: Insufficient documentation

## 2013-03-26 DIAGNOSIS — R5381 Other malaise: Secondary | ICD-10-CM | POA: Insufficient documentation

## 2013-03-26 DIAGNOSIS — Z79899 Other long term (current) drug therapy: Secondary | ICD-10-CM | POA: Insufficient documentation

## 2013-03-26 DIAGNOSIS — F29 Unspecified psychosis not due to a substance or known physiological condition: Secondary | ICD-10-CM | POA: Insufficient documentation

## 2013-03-26 DIAGNOSIS — R4789 Other speech disturbances: Secondary | ICD-10-CM | POA: Insufficient documentation

## 2013-03-26 DIAGNOSIS — R42 Dizziness and giddiness: Secondary | ICD-10-CM | POA: Insufficient documentation

## 2013-03-26 DIAGNOSIS — R259 Unspecified abnormal involuntary movements: Secondary | ICD-10-CM | POA: Insufficient documentation

## 2013-03-26 DIAGNOSIS — R51 Headache: Secondary | ICD-10-CM | POA: Insufficient documentation

## 2013-03-26 DIAGNOSIS — K117 Disturbances of salivary secretion: Secondary | ICD-10-CM | POA: Insufficient documentation

## 2013-03-26 DIAGNOSIS — R5383 Other fatigue: Secondary | ICD-10-CM

## 2013-03-26 DIAGNOSIS — R110 Nausea: Secondary | ICD-10-CM | POA: Insufficient documentation

## 2013-03-26 DIAGNOSIS — Z794 Long term (current) use of insulin: Secondary | ICD-10-CM | POA: Insufficient documentation

## 2013-03-26 DIAGNOSIS — E119 Type 2 diabetes mellitus without complications: Secondary | ICD-10-CM | POA: Insufficient documentation

## 2013-03-26 LAB — CBC WITH DIFFERENTIAL/PLATELET
BASOS ABS: 0.1 10*3/uL (ref 0.0–0.1)
Basophils Relative: 1 % (ref 0–1)
EOS ABS: 0.3 10*3/uL (ref 0.0–0.7)
Eosinophils Relative: 5 % (ref 0–5)
HCT: 44.6 % (ref 39.0–52.0)
Hemoglobin: 16.3 g/dL (ref 13.0–17.0)
Lymphocytes Relative: 34 % (ref 12–46)
Lymphs Abs: 2.2 10*3/uL (ref 0.7–4.0)
MCH: 29.6 pg (ref 26.0–34.0)
MCHC: 36.5 g/dL — ABNORMAL HIGH (ref 30.0–36.0)
MCV: 81.1 fL (ref 78.0–100.0)
Monocytes Absolute: 0.5 10*3/uL (ref 0.1–1.0)
Monocytes Relative: 7 % (ref 3–12)
NEUTROS PCT: 53 % (ref 43–77)
Neutro Abs: 3.5 10*3/uL (ref 1.7–7.7)
PLATELETS: 248 10*3/uL (ref 150–400)
RBC: 5.5 MIL/uL (ref 4.22–5.81)
RDW: 12.2 % (ref 11.5–15.5)
WBC: 6.6 10*3/uL (ref 4.0–10.5)

## 2013-03-26 LAB — COMPREHENSIVE METABOLIC PANEL
ALK PHOS: 101 U/L (ref 39–117)
ALT: 23 U/L (ref 0–53)
AST: 21 U/L (ref 0–37)
Albumin: 3.5 g/dL (ref 3.5–5.2)
BUN: 28 mg/dL — ABNORMAL HIGH (ref 6–23)
CALCIUM: 9.9 mg/dL (ref 8.4–10.5)
CO2: 23 mEq/L (ref 19–32)
Chloride: 98 mEq/L (ref 96–112)
Creatinine, Ser: 0.84 mg/dL (ref 0.50–1.35)
GFR calc Af Amer: >90 mL/min (ref >90)
GFR calc non Af Amer: >90 mL/min (ref >90)
Glucose, Bld: 203 mg/dL — ABNORMAL HIGH (ref 70–99)
POTASSIUM: 3.8 meq/L (ref 3.7–5.3)
SODIUM: 135 meq/L — AB (ref 137–147)
TOTAL PROTEIN: 7.2 g/dL (ref 6.0–8.3)
Total Bilirubin: 0.3 mg/dL (ref 0.3–1.2)

## 2013-03-26 LAB — CBG MONITORING, ED: GLUCOSE-CAPILLARY: 252 mg/dL — AB (ref 70–99)

## 2013-03-26 MED ORDER — SODIUM CHLORIDE 0.9 % IV BOLUS (SEPSIS)
1000.0000 mL | Freq: Once | INTRAVENOUS | Status: AC
  Administered 2013-03-26: 1000 mL via INTRAVENOUS

## 2013-03-26 MED ORDER — SODIUM CHLORIDE 0.9 % IV BOLUS (SEPSIS)
1000.0000 mL | Freq: Once | INTRAVENOUS | Status: AC
  Administered 2013-03-27: 1000 mL via INTRAVENOUS

## 2013-03-26 MED ORDER — DIAZEPAM 5 MG PO TABS
5.0000 mg | ORAL_TABLET | Freq: Once | ORAL | Status: AC
  Administered 2013-03-26: 5 mg via ORAL
  Filled 2013-03-26: qty 1

## 2013-03-26 NOTE — ED Notes (Signed)
Family state pt recently began seeing new physician was taken off all meds except Insulin. Pts began feeling bad this evening, shaking, blurred vision, CBG 451 at, pt took insulin, now cramping in arms and legs.

## 2013-03-26 NOTE — ED Notes (Signed)
Notified RN, Autumn pt. CBG 252.

## 2013-03-27 ENCOUNTER — Telehealth: Payer: Self-pay | Admitting: Endocrinology

## 2013-03-27 LAB — CBG MONITORING, ED: Glucose-Capillary: 80 mg/dL (ref 70–99)

## 2013-03-27 MED ORDER — INSULIN GLARGINE 100 UNIT/ML SOLOSTAR PEN: 150.0000 [IU] | PEN_INJECTOR | Freq: Every day | SUBCUTANEOUS | Status: DC

## 2013-03-27 NOTE — Discharge Instructions (Signed)
Continue your insulin and watch your diet carefully. Follow up with your doctor tomorrow.    Hyperglycemia Hyperglycemia occurs when the glucose (sugar) in your blood is too high. Hyperglycemia can happen for many reasons, but it most often happens to people who do not know they have diabetes or are not managing their diabetes properly.  CAUSES  Whether you have diabetes or not, there are other causes of hyperglycemia. Hyperglycemia can occur when you have diabetes, but it can also occur in other situations that you might not be as aware of, such as: Diabetes  If you have diabetes and are having problems controlling your blood glucose, hyperglycemia could occur because of some of the following reasons:  Not following your meal plan.  Not taking your diabetes medications or not taking it properly.  Exercising less or doing less activity than you normally do.  Being sick. Pre-diabetes  This cannot be ignored. Before people develop Type 2 diabetes, they almost always have "pre-diabetes." This is when your blood glucose levels are higher than normal, but not yet high enough to be diagnosed as diabetes. Research has shown that some long-term damage to the body, especially the heart and circulatory system, may already be occurring during pre-diabetes. If you take action to manage your blood glucose when you have pre-diabetes, you may delay or prevent Type 2 diabetes from developing. Stress  If you have diabetes, you may be "diet" controlled or on oral medications or insulin to control your diabetes. However, you may find that your blood glucose is higher than usual in the hospital whether you have diabetes or not. This is often referred to as "stress hyperglycemia." Stress can elevate your blood glucose. This happens because of hormones put out by the body during times of stress. If stress has been the cause of your high blood glucose, it can be followed regularly by your caregiver. That way he/she  can make sure your hyperglycemia does not continue to get worse or progress to diabetes. Steroids  Steroids are medications that act on the infection fighting system (immune system) to block inflammation or infection. One side effect can be a rise in blood glucose. Most people can produce enough extra insulin to allow for this rise, but for those who cannot, steroids make blood glucose levels go even higher. It is not unusual for steroid treatments to "uncover" diabetes that is developing. It is not always possible to determine if the hyperglycemia will go away after the steroids are stopped. A special blood test called an A1c is sometimes done to determine if your blood glucose was elevated before the steroids were started. SYMPTOMS  Thirsty.  Frequent urination.  Dry mouth.  Blurred vision.  Tired or fatigue.  Weakness.  Sleepy.  Tingling in feet or leg. DIAGNOSIS  Diagnosis is made by monitoring blood glucose in one or all of the following ways:  A1c test. This is a chemical found in your blood.  Fingerstick blood glucose monitoring.  Laboratory results. TREATMENT  First, knowing the cause of the hyperglycemia is important before the hyperglycemia can be treated. Treatment may include, but is not be limited to:  Education.  Change or adjustment in medications.  Change or adjustment in meal plan.  Treatment for an illness, infection, etc.  More frequent blood glucose monitoring.  Change in exercise plan.  Decreasing or stopping steroids.  Lifestyle changes. HOME CARE INSTRUCTIONS   Test your blood glucose as directed.  Exercise regularly. Your caregiver will give you instructions  about exercise. Pre-diabetes or diabetes which comes on with stress is helped by exercising.  Eat wholesome, balanced meals. Eat often and at regular, fixed times. Your caregiver or nutritionist will give you a meal plan to guide your sugar intake.  Being at an ideal weight is  important. If needed, losing as little as 10 to 15 pounds may help improve blood glucose levels. SEEK MEDICAL CARE IF:   You have questions about medicine, activity, or diet.  You continue to have symptoms (problems such as increased thirst, urination, or weight gain). SEEK IMMEDIATE MEDICAL CARE IF:   You are vomiting or have diarrhea.  Your breath smells fruity.  You are breathing faster or slower.  You are very sleepy or incoherent.  You have numbness, tingling, or pain in your feet or hands.  You have chest pain.  Your symptoms get worse even though you have been following your caregiver's orders.  If you have any other questions or concerns. Document Released: 06/14/2000 Document Revised: 03/13/2011 Document Reviewed: 04/17/2011 [facility] Patient Information 2014 [place], [place].

## 2013-03-27 NOTE — Telephone Encounter (Signed)
please call patient: i have sent a prescription to your pharmacy, for the once a day insulin. Please come back for a follow-up appointment in 3-4 weeks

## 2013-03-27 NOTE — Telephone Encounter (Signed)
Pt informed and is scheduled to come for appointment on 04/25/2013.

## 2013-03-27 NOTE — Telephone Encounter (Signed)
Pt informed and states that he is ok with changing to once a day insulin.

## 2013-03-27 NOTE — ED Provider Notes (Signed)
CSN: [id number]     Arrival date & time 03/26/13  2206 History   First MD Initiated Contact with Patient 03/26/13 2238     Chief Complaint  Patient presents with  . Hyperglycemia     (Consider location/radiation/quality/duration/timing/severity/associated sxs/prior Treatment) HPI Russell [name] is a 43 y.o. male who presents to emergency department with complaint of elevated blood sugar, dizziness, cramping in his legs, shaking, blurred vision. Patient states that he has been taken off of all oral diabetes medications 2 weeks ago and started just on insulin. States he started seeing a new endocrinologist. States was rechecked a week ago and had his insulin dose was increased because his blood sugars persistently were high. States yesterday his blood sugar was almost 500. States today he checks his blood sugar after eating and it was 451. He states he took his regular insulin dose, and rechecked one hour after and he was still 450. States he called the nurse on call for his doctor and he was told to come produce department he told his complaints. Patient states when he got to the emergency department his blood sugar dropped to 200s. He states he continues to have shaking, blurred vision has improved, he states he feels that his speech is slurring, and he continues to have cramping in bilateral calves. Patient denies any making symptoms better or worse. He does not have any other complaints.   Past Medical History  Diagnosis Date  . Diabetes mellitus without complication    Past Surgical History  Procedure Laterality Date  . Right ankle fx repair    . Eye surgery     No family history on file. History  Substance Use Topics  . Smoking status: Never Smoker   . Smokeless tobacco: Not on file  . Alcohol Use: No    Review of Systems  Constitutional: Positive for fatigue. Negative for fever and chills.  Eyes: Positive for visual disturbance.  Respiratory: Negative for cough, chest tightness  and shortness of breath.   Cardiovascular: Negative for chest pain, palpitations and leg swelling.  Gastrointestinal: Positive for nausea. Negative for vomiting, abdominal pain, diarrhea and abdominal distention.  Genitourinary: Negative for dysuria, urgency, frequency and hematuria.  Musculoskeletal: Negative for arthralgias, myalgias, neck pain and neck stiffness.  Skin: Negative for rash.  Allergic/Immunologic: Negative for immunocompromised state.  Neurological: Positive for dizziness, tremors, weakness, light-headedness and headaches. Negative for numbness.  Psychiatric/Behavioral: Positive for confusion.      Allergies  Review of patient's allergies indicates no known allergies.  Home Medications   Current Outpatient Rx  Name  Route  Sig  Dispense  Refill  . ALPRAZolam (XANAX) 0.5 MG tablet   Oral   Take 1 tablet (0.5 mg total) by mouth 2 (two) times daily as needed for sleep.   60 tablet   5   . cyclobenzaprine (FLEXERIL) 10 MG tablet   Oral   Take 1 tablet (10 mg total) by mouth at bedtime as needed for muscle spasms.   30 tablet   11   . gabapentin (NEURONTIN) 600 MG tablet   Oral   Take 600 mg by mouth every 4 (four) hours.         Marland Kitchen ibuprofen (ADVIL,MOTRIN) 800 MG tablet   Oral   Take 1 tablet (800 mg total) by mouth every 8 (eight) hours as needed.   90 tablet   11   . insulin lispro (HUMALOG) 100 UNIT/ML KiwkPen   Subcutaneous   Inject 50 Units  into the skin 3 (three) times daily with meals. And pen needles, 3/day   60 mL   11   . lisinopril (PRINIVIL,ZESTRIL) 10 MG tablet   Oral   Take 1 tablet (10 mg total) by mouth daily.   90 tablet   3    BP 162/91  Pulse 125  Temp(Src) 98.6 F (37 C) (Oral)  Resp 18  Ht 6\' 2"  (1.88 m)  Wt 240 lb (108.863 kg)  BMI 30.80 kg/m2  SpO2 99% Physical Exam  Nursing note and vitals reviewed. Constitutional: He is oriented to person, place, and time. He appears well-developed and well-nourished. No  distress.  HENT:  Head: Normocephalic and atraumatic.  Eyes: Conjunctivae and EOM are normal. Pupils are equal, round, and reactive to light.  Neck: Neck supple.  Cardiovascular: Normal rate, regular rhythm and normal heart sounds.   Pulmonary/Chest: Effort normal. No respiratory distress. He has no wheezes. He has no rales.  Abdominal: Soft. Bowel sounds are normal. He exhibits no distension. There is no tenderness. There is no rebound.  Musculoskeletal: He exhibits no edema.  Neurological: He is alert and oriented to person, place, and time. No cranial nerve deficit. Coordination normal.  Dry mouth, slurred speech, tremulous. Normal neurological exam otherwise. 5/5 and equal upper and lower extremity strength bilaterally. Equal grip strength bilaterally. Normal finger to nose and heel to shin. No pronator drift.    Skin: Skin is warm and dry.    ED Course  Procedures (including critical care time) Labs Review Labs Reviewed  CBC WITH DIFFERENTIAL - Abnormal; Notable for the following:    MCHC 36.5 (*)    All other components within normal limits  COMPREHENSIVE METABOLIC PANEL - Abnormal; Notable for the following:    Sodium 135 (*)    Glucose, Bld 203 (*)    BUN 28 (*)    All other components within normal limits  CBG MONITORING, ED - Abnormal; Notable for the following:    Glucose-Capillary 252 (*)    All other components within normal limits  CBG MONITORING, ED   Imaging Review No results found.   EKG Interpretation None      MDM   Final diagnoses:  Hyperglycemia  Dehydration    Patient with poorly controlled diabetes. CBG initially is 252 in emergency department. Patient states that just one hour ago his blood sugar was in the high 400s. Will get electrolytes and blood counts. IV fluids started. Patient has normal neurological exam except for mildly slurred speech and tremor, he states he has had similar symptoms in the past with high blood sugar. His wife confirms.  Patient's tachycardia appears to be dehydrated.   1:19 AM Patient's blood sugar rechecked, no medications given in the emergency department, his blood sugars.80. He was given sandwich and some crackers. I suspect this is the cause of his shakiness, and slurred speech. I have ordered him 3 L of IV fluids. We'll monitor.   1:53 AM PT feeling much better. Symptoms now resolved after eating a sandwitch. Anion gap 14. Pt received 3L of fluids. Will d/c home with follow up.    Filed Vitals:   03/26/13 2209 03/26/13 2230 03/27/13 0130  BP: 162/91  120/70  Pulse: 125  100  Temp: 98.6 F (37 C)    TempSrc: Oral    Resp: 18    Height:  6\' 2"  (1.88 m)   Weight:  240 lb (108.863 kg)   SpO2: 99%  94%  Lottie Musselatyana A Carolan Avedisian, PA-C 03/27/13 [phone]

## 2013-03-27 NOTE — Telephone Encounter (Signed)
please call patient: i heard you were in the ER.  Do you want to change to just the once-a-day insulin?

## 2013-03-27 NOTE — ED Provider Notes (Signed)
Medical screening examination/treatment/procedure(s) were performed by non-physician practitioner and as supervising physician I was immediately available for consultation/collaboration.   EKG Interpretation None        Yolinda Duerr, MD 03/27/13 2032 

## 2013-03-28 NOTE — Telephone Encounter (Signed)
Pt just tested his blood sugar and it is high  Fasting 175 this before 8 am, ate an egg Before lunch 1245 the sugar read was 350

## 2013-03-28 NOTE — Telephone Encounter (Signed)
Pt informed

## 2013-03-28 NOTE — Telephone Encounter (Signed)
i know it is high, but it is too soon to tell the result from the new insulin.  Please give it a few more days.

## 2013-03-28 NOTE — Telephone Encounter (Signed)
Lunch reading was 245. Please see below and advise, Thanks!

## 2013-04-10 ENCOUNTER — Ambulatory Visit (INDEPENDENT_AMBULATORY_CARE_PROVIDER_SITE_OTHER): Payer: Managed Care, Other (non HMO) | Admitting: Family Medicine

## 2013-04-10 ENCOUNTER — Encounter: Payer: Self-pay | Admitting: Family Medicine

## 2013-04-10 VITALS — BP 142/90 | HR 109 | Temp 97.7°F | Resp 16 | Ht 72.0 in | Wt 249.8 lb

## 2013-04-10 DIAGNOSIS — J329 Chronic sinusitis, unspecified: Secondary | ICD-10-CM

## 2013-04-10 DIAGNOSIS — E119 Type 2 diabetes mellitus without complications: Secondary | ICD-10-CM

## 2013-04-10 MED ORDER — AZITHROMYCIN 250 MG PO TABS: ORAL_TABLET | ORAL | Status: DC

## 2013-04-10 NOTE — Progress Notes (Signed)
Subjective:  This chart was scribed for [name], MD by Russell [name], Medical Scribe. This patient was seen in Room 25 and the patient's care was started at 8:42 AM.   Patient ID: Russell [name], male    DOB: Nov 02, 1970, 43 y.o.   MRN: [id number]  HPI HPI Comments: Russell [name] is a 43 y.o. male who presents to the [facility] for a hospitalization follow-up appointment.  The patient states that he was hospitalized because a doctor he saw took him all of his medication and was hyperglycemic.  The patient states that while in the hospital his sugar was 492 and then elevated to 493.  He states that now his sugar has decreased to the 70s.  He states that he is currently taking 150 ml of Lantis daily at 6:00 PM every night.  He states that the medication his working but he has gained 40 pounds in a month.  The patient states that he has talked to his other physician but has been dismissed.  He states that he would like to see Dr. Myrna Blazer at [facility].  He states that he has not met him before but he was recommended to him by his insurance company.     The patient is also complaining of vision loss.  He states that he cannot see things up close to him.    He is also complaining of constant congestion and rhinorrhea.  He lists intermittent shortness of breath as an associated symptom.    Past Medical History  Diagnosis Date   Diabetes mellitus without complication    Past Surgical History  Procedure Laterality Date   Right ankle fx repair     Eye surgery     No family history on file. History   Social History   Marital Status: Single    Spouse Name: N/A    Number of Children: N/A   Years of Education: N/A   Occupational History   Not on file.   Social History Main Topics   Smoking status: Never Smoker    Smokeless tobacco: Not on file   Alcohol Use: No   Drug Use: No   Sexual Activity: Yes   Other Topics Concern   Not on file   Social History  Narrative   No narrative on file   No Known Allergies  Review of Systems  Constitutional: Positive for unexpected weight change (weight gain).  HENT: Positive for congestion and rhinorrhea.   Eyes: Positive for visual disturbance.  Respiratory: Positive for shortness of breath.   All other systems reviewed and are negative.    Objective:  Physical Exam  Nursing note and vitals reviewed. Constitutional: He is oriented to person, place, and time. He appears well-developed and well-nourished.  HENT:  Head: Normocephalic and atraumatic.  Eyes: EOM are normal.  Neck: Normal range of motion.  Cardiovascular: Normal rate.   Pulmonary/Chest: Effort normal.  Musculoskeletal: Normal range of motion.  Neurological: He is alert and oriented to person, place, and time.  Skin: Skin is warm and dry.  Psychiatric: He has a normal mood and affect. His behavior is normal.       BP 142/90   Pulse 109   Temp(Src) 97.7 F (36.5 C) (Oral)   Resp 16   Ht 6' (1.829 m)   Wt 249 lb 12.8 oz (113.309 kg)   BMI 33.87 kg/m2   SpO2 96% Assessment & Plan:    I personally performed the services described  in this documentation, which was scribed in my presence. The recorded information has been reviewed and is accurate.  Diabetes - Plan: Ambulatory referral to Endocrinology  Sinusitis - Plan: azithromycin (ZITHROMAX Z-PAK) 250 MG tablet  Signed, [name], MD

## 2013-04-25 ENCOUNTER — Ambulatory Visit: Payer: Managed Care, Other (non HMO) | Admitting: Endocrinology

## 2013-05-01 ENCOUNTER — Ambulatory Visit: Payer: Managed Care, Other (non HMO)

## 2013-05-01 ENCOUNTER — Encounter: Payer: Self-pay | Admitting: Family Medicine

## 2013-05-01 ENCOUNTER — Ambulatory Visit (INDEPENDENT_AMBULATORY_CARE_PROVIDER_SITE_OTHER): Payer: Managed Care, Other (non HMO) | Admitting: Family Medicine

## 2013-05-01 VITALS — BP 160/100 | HR 109 | Temp 98.3°F | Resp 16 | Ht 71.5 in | Wt 255.2 lb

## 2013-05-01 DIAGNOSIS — E1159 Type 2 diabetes mellitus with other circulatory complications: Secondary | ICD-10-CM

## 2013-05-01 DIAGNOSIS — R0989 Other specified symptoms and signs involving the circulatory and respiratory systems: Secondary | ICD-10-CM

## 2013-05-01 DIAGNOSIS — R188 Other ascites: Secondary | ICD-10-CM

## 2013-05-01 DIAGNOSIS — R635 Abnormal weight gain: Secondary | ICD-10-CM

## 2013-05-01 DIAGNOSIS — R079 Chest pain, unspecified: Secondary | ICD-10-CM

## 2013-05-01 DIAGNOSIS — R0600 Dyspnea, unspecified: Secondary | ICD-10-CM

## 2013-05-01 DIAGNOSIS — R0609 Other forms of dyspnea: Secondary | ICD-10-CM

## 2013-05-01 LAB — POCT URINALYSIS DIPSTICK
Bilirubin, UA: NEGATIVE
Glucose, UA: NEGATIVE
Ketones, UA: NEGATIVE
Leukocytes, UA: NEGATIVE
Nitrite, UA: NEGATIVE
Protein, UA: >=300
Spec Grav, UA: 1.025
Urobilinogen, UA: 1
pH, UA: 7

## 2013-05-01 LAB — POCT CBC
Granulocyte percent: 62.2 %G (ref 37–80)
HCT, POC: 42.8 % — AB (ref 43.5–53.7)
Hemoglobin: 14.2 g/dL (ref 14.1–18.1)
Lymph, poc: 1.9 (ref 0.6–3.4)
MCH, POC: 29.2 pg (ref 27–31.2)
MCHC: 33.2 g/dL (ref 31.8–35.4)
MCV: 88 fL (ref 80–97)
MID (cbc): 0.5 (ref 0–0.9)
MPV: 9 fL (ref 0–99.8)
POC Granulocyte: 4 (ref 2–6.9)
POC LYMPH PERCENT: 29.5 %L (ref 10–50)
POC MID %: 8.3 %M (ref 0–12)
Platelet Count, POC: 312 10*3/uL (ref 142–424)
RBC: 4.86 M/uL (ref 4.69–6.13)
RDW, POC: 13.6 %
WBC: 6.5 10*3/uL (ref 4.6–10.2)

## 2013-05-01 LAB — COMPREHENSIVE METABOLIC PANEL
ALT: 25 U/L (ref 0–53)
AST: 24 U/L (ref 0–37)
Albumin: 3.4 g/dL — ABNORMAL LOW (ref 3.5–5.2)
Alkaline Phosphatase: 58 U/L (ref 39–117)
BUN: 22 mg/dL (ref 6–23)
CO2: 27 mEq/L (ref 19–32)
Calcium: 8.6 mg/dL (ref 8.4–10.5)
Chloride: 108 mEq/L (ref 96–112)
Creat: 0.85 mg/dL (ref 0.50–1.35)
Glucose, Bld: 68 mg/dL — ABNORMAL LOW (ref 70–99)
Potassium: 3.7 mEq/L (ref 3.5–5.3)
Sodium: 142 mEq/L (ref 135–145)
Total Bilirubin: 0.4 mg/dL (ref 0.2–1.2)
Total Protein: 5.7 g/dL — ABNORMAL LOW (ref 6.0–8.3)

## 2013-05-01 LAB — TSH: TSH: 7.579 u[IU]/mL — ABNORMAL HIGH (ref 0.350–4.500)

## 2013-05-01 MED ORDER — FUROSEMIDE 40 MG PO TABS: 40.0000 mg | ORAL_TABLET | Freq: Two times a day (BID) | ORAL | Status: DC

## 2013-05-01 NOTE — Progress Notes (Addendum)
Subjective:  This chart was scribed for Russell SidleKurt Lauenstein, MD by Danella Maiersaroline Early, ED Scribe. This patient was seen in room 13 and the patient's care was started at 5:44 PM.   Patient ID: Russell [name], male    DOB: July 17, 1970, 43 y.o.   MRN: [id number]  Chief Complaint  Patient presents with   Hypertension    swelling, wheezing   Shortness of Breath    HPI HPI Comments: Russell BirkenheadSteven B Felmlee is a 43 y.o. male with a h/o DM type II who presents to the [facility] complaining of gradually-worsening moderate SOB and dyspnea due to recent weight gain. Pt states he has gained 20 pounds in the last 1-2 months which has significantly worsened his SOB. He also reports moderate swelling, pressure, and tightness in the abdomen with associated pain with bending over due to fluid build-up. He also reports a pressure pain in his chest described as feeling like a knot. He reports swelling in his hands and feet. He has an appointment with endocrinology July 29. He is also wheezing. He is urinating normally.  His sugars have been averaging 150 in the mornings and 50-60 in the evenings.    PCP Russell Sidle- [name],KURT, MD  Past Medical History  Diagnosis Date   Diabetes mellitus without complication    Current Outpatient Prescriptions on File Prior to Visit  Medication Sig Dispense Refill   ALPRAZolam (XANAX) 0.5 MG tablet Take 1 tablet (0.5 mg total) by mouth 2 (two) times daily as needed for sleep.  60 tablet  5   cyclobenzaprine (FLEXERIL) 10 MG tablet Take 1 tablet (10 mg total) by mouth at bedtime as needed for muscle spasms.  30 tablet  11   gabapentin (NEURONTIN) 600 MG tablet Take 600 mg by mouth every 4 (four) hours.       ibuprofen (ADVIL,MOTRIN) 800 MG tablet Take 1 tablet (800 mg total) by mouth every 8 (eight) hours as needed.  90 tablet  11   Insulin Glargine (LANTUS SOLOSTAR) 100 UNIT/ML Solostar Pen Inject 150 Units into the skin daily. And pen needles 1/day  20 pen  PRN     lisinopril (PRINIVIL,ZESTRIL) 10 MG tablet Take 1 tablet (10 mg total) by mouth daily.  90 tablet  3   azithromycin (ZITHROMAX Z-PAK) 250 MG tablet Take as directed on pack  6 tablet  0   No current facility-administered medications on file prior to visit.   No Known Allergies  History  Substance Use Topics   Smoking status: Never Smoker    Smokeless tobacco: Not on file   Alcohol Use: No    Review of Systems  Constitutional: Negative for fever and chills.  Respiratory: Positive for shortness of breath and wheezing.   Cardiovascular: Positive for chest pain and leg swelling.  Gastrointestinal: Negative for vomiting.  Genitourinary: Negative for dysuria and frequency.  Skin: Negative for rash.       Objective:   Physical Exam  Filed Vitals:   05/01/13 1718  BP: 160/100  Pulse: 109  Temp: 98.3 F (36.8 C)  TempSrc: Oral  Resp: 16  Height: 5' 11.5" (1.816 m)  Weight: 255 lb 3.2 oz (115.758 kg)  SpO2: 98%    Wt Readings from Last 3 Encounters:  05/01/13 255 lb 3.2 oz (115.758 kg)  04/10/13 249 lb 12.8 oz (113.309 kg)  03/26/13 240 lb (108.863 kg)   patient appears to be moderately ill with some shortness of breath sitting at rest. Neck:  Somewhat thickened and is difficult to say whether he has any JVD. There is no thyromegaly. His neck is supple Chest: Expiratory wheezes bilaterally Heart: Regular without murmur or gallop Abdomen: Protuberant, difficult to ascertain liver and spleen size the patient is diffusely tender with deep palpation and appears to have ascites. Extremities: Bilateral pedal edema. [facility] reading (PRIMARY) by  Dr. Milus GlazierLauenstein  CXR.  Poor inspiration with some pulmonary congestion Results for orders placed in visit on 05/01/13  POCT CBC      Result Value Ref Range   WBC 6.5  4.6 - 10.2 K/uL   Lymph, poc 1.9  0.6 - 3.4   POC LYMPH PERCENT 29.5  10 - 50 %L   MID (cbc) 0.5  0 - 0.9   POC MID % 8.3  0 - 12 %M   POC Granulocyte 4.0  2 - 6.9    Granulocyte percent 62.2  37 - 80 %G   RBC 4.86  4.69 - 6.13 M/uL   Hemoglobin 14.2  14.1 - 18.1 g/dL   HCT, POC 40.942.8 (*) 81.143.5 - 53.7 %   MCV 88.0  80 - 97 fL   MCH, POC 29.2  27 - 31.2 pg   MCHC 33.2  31.8 - 35.4 g/dL   RDW, POC 91.413.6     Platelet Count, POC 312  142 - 424 K/uL   MPV 9.0  0 - 99.8 fL  POCT URINALYSIS DIPSTICK      Result Value Ref Range   Color, UA yellow     Clarity, UA clear     Glucose, UA neg     Bilirubin, UA neg     Ketones, UA neg     Spec Grav, UA 1.025     Blood, UA moderate     pH, UA 7.0     Protein, UA >=300     Urobilinogen, UA 1.0     Nitrite, UA neg     Leukocytes, UA Negative           Assessment & Plan:   1. Chest pain   2. Ascites   3. Dyspnea   4. Weight gain     Chest pain - Plan: DG Chest 2 View, Comprehensive metabolic panel, POCT CBC, POCT urinalysis dipstick, Brain natriuretic peptide, TSH, Ambulatory referral to Endocrinology  Ascites - Plan: DG Chest 2 View, Comprehensive metabolic panel, POCT CBC, POCT urinalysis dipstick, Brain natriuretic peptide, TSH, Ambulatory referral to Endocrinology, furosemide (LASIX) 40 MG tablet, DISCONTINUED: furosemide (LASIX) 40 MG tablet  Dyspnea - Plan: DG Chest 2 View, Comprehensive metabolic panel, POCT CBC, POCT urinalysis dipstick, Brain natriuretic peptide, TSH, Ambulatory referral to Endocrinology, furosemide (LASIX) 40 MG tablet, DISCONTINUED: furosemide (LASIX) 40 MG tablet  Weight gain - Plan: DG Chest 2 View, Comprehensive metabolic panel, POCT CBC, POCT urinalysis dipstick, Brain natriuretic peptide, TSH, Ambulatory referral to Endocrinology, furosemide (LASIX) 40 MG tablet, DISCONTINUED: furosemide (LASIX) 40 MG tablet  Type II or unspecified type diabetes mellitus with peripheral circulatory disorders, uncontrolled(250.72) - Plan: Ambulatory referral to Endocrinology Recheck 48 hours. Signed, Russell SidleKurt Lauenstein, MD

## 2013-05-01 NOTE — Patient Instructions (Signed)
No work until rechecked.  I want to see you on Saturday

## 2013-05-02 ENCOUNTER — Other Ambulatory Visit: Payer: Self-pay | Admitting: Family Medicine

## 2013-05-02 DIAGNOSIS — E039 Hypothyroidism, unspecified: Secondary | ICD-10-CM

## 2013-05-02 LAB — BRAIN NATRIURETIC PEPTIDE: Brain Natriuretic Peptide: 10.4 pg/mL (ref 0.0–100.0)

## 2013-05-02 MED ORDER — LEVOTHYROXINE SODIUM 50 MCG PO TABS: 50.0000 ug | ORAL_TABLET | Freq: Every day | ORAL | Status: DC

## 2013-05-04 ENCOUNTER — Encounter: Payer: Self-pay | Admitting: Family Medicine

## 2013-05-04 ENCOUNTER — Ambulatory Visit (INDEPENDENT_AMBULATORY_CARE_PROVIDER_SITE_OTHER): Payer: Managed Care, Other (non HMO) | Admitting: Family Medicine

## 2013-05-04 VITALS — BP 138/92 | HR 116 | Temp 97.5°F | Resp 16 | Ht 72.0 in | Wt 256.0 lb

## 2013-05-04 DIAGNOSIS — R609 Edema, unspecified: Secondary | ICD-10-CM

## 2013-05-04 DIAGNOSIS — R635 Abnormal weight gain: Secondary | ICD-10-CM

## 2013-05-04 DIAGNOSIS — E119 Type 2 diabetes mellitus without complications: Secondary | ICD-10-CM

## 2013-05-04 DIAGNOSIS — R319 Hematuria, unspecified: Secondary | ICD-10-CM

## 2013-05-04 LAB — POCT UA - MICROSCOPIC ONLY
Casts, Ur, LPF, POC: NEGATIVE
Crystals, Ur, HPF, POC: NEGATIVE
Mucus, UA: NEGATIVE
Yeast, UA: NEGATIVE

## 2013-05-04 MED ORDER — TORSEMIDE 20 MG PO TABS: 20.0000 mg | ORAL_TABLET | Freq: Every day | ORAL | Status: DC

## 2013-05-04 NOTE — Progress Notes (Signed)
Subjective:    Patient ID: Russell [name], male    DOB: Oct 10, 1970, 43 y.o.   MRN: [id number]  HPI Chief Complaint  Patient presents with   Follow-up    Chest pain   This chart was scribed for [name], MD by Andrew Auaven Small, ED Scribe. This patient was seen in room  and the patient's care was started at 4:32 PM.  HPI Comments: Russell [name] is a 43 y.o. male with h/o DM who presents to the [facility] here for a follow up regarding SOB and dyspnea. Pt was seen 4 days ago for SOB and was prescribed Lasix. Today pt reports he is not urinating much on the medication. Pt reports dry mouth and rib pain. Pt reports that he is in a lot of pain.  He reports difficulty breathing at night.   Pt reports that he is on a very high dose of insulin (150) and that his blood sugar is constantly fluctuating especially after eating, his sugars are high. He reports reports taking two doses of 70 and 80.   Pt reports that he is concerned about the hematuria since last visit.  Past Medical History  Diagnosis Date   Diabetes mellitus without complication    No Known Allergies Prior to Admission medications   Medication Sig Start Date End Date Taking? Authorizing Provider  ALPRAZolam Prudy Feeler(XANAX) 0.5 MG tablet Take 1 tablet (0.5 mg total) by mouth 2 (two) times daily as needed for sleep. 01/28/13   [name], MD  azithromycin (ZITHROMAX Z-PAK) 250 MG tablet Take as directed on pack 04/10/13   [name], MD  cyclobenzaprine (FLEXERIL) 10 MG tablet Take 1 tablet (10 mg total) by mouth at bedtime as needed for muscle spasms. 01/28/13   [name], MD  furosemide (LASIX) 40 MG tablet Take 1 tablet (40 mg total) by mouth 2 (two) times daily. 05/01/13   [name], MD  gabapentin (NEURONTIN) 600 MG tablet Take 600 mg by mouth every 4 (four) hours. 12/19/11   [name], MD  ibuprofen (ADVIL,MOTRIN) 800 MG tablet Take 1 tablet (800 mg total) by mouth every 8 (eight)  hours as needed. 01/28/13   [name], MD  Insulin Glargine (LANTUS SOLOSTAR) 100 UNIT/ML Solostar Pen Inject 150 Units into the skin daily. And pen needles 1/day 03/27/13   Romero BellingSean Ellison, MD  levothyroxine (SYNTHROID, LEVOTHROID) 50 MCG tablet Take 1 tablet (50 mcg total) by mouth daily. 05/02/13   [name], MD  lisinopril (PRINIVIL,ZESTRIL) 10 MG tablet Take 1 tablet (10 mg total) by mouth daily. 01/28/13   [name], MD   Review of Systems  Respiratory: Positive for apnea.   Cardiovascular: Positive for chest pain.  Genitourinary: Positive for decreased urine volume.      Objective:   Physical Exam  Nursing note and vitals reviewed. Constitutional: He is oriented to person, place, and time. He appears well-developed and well-nourished. No distress.  HENT:  Head: Normocephalic and atraumatic.  Eyes: EOM are normal.  Neck: Neck supple.  Musculoskeletal: Normal range of motion.  Neurological: He is alert and oriented to person, place, and time.  Skin: Skin is warm and dry.  Psychiatric: He has a normal mood and affect. His behavior is normal.   patient continues to have mild distention of his abdomen with mild discomfort on palpation of the right upper quadrant. Extremities show 1+ edema both legs.  Wt Readings from Last 3 Encounters:  05/04/13 256 lb (116.121 kg)  05/01/13 255 lb 3.2 oz (115.758 kg)  04/10/13 249 lb 12.8 oz (113.309 kg)   Results for orders placed in visit on 05/04/13  POCT UA - MICROSCOPIC ONLY      Result Value Ref Range   WBC, Ur, HPF, POC 0-1     RBC, urine, microscopic 0-1     Bacteria, U Microscopic trace     Mucus, UA neg     Epithelial cells, urine per micros 0-3     Crystals, Ur, HPF, POC neg     Casts, Ur, LPF, POC neg     Yeast, UA neg         Assessment & Plan:    Reduce insulin to 100 units per day in 2 divided doses. Edema - Plan: torsemide (DEMADEX) 20 MG tablet, Ambulatory referral to Endocrinology, POCT UA - Microscopic  Only I've asked patient to pick up his Synthroid prescription since he was not notified on Friday but this was called in.  I'm doing everything I can to get patient in to see endocrinologist ASAP Signed, [name], MD

## 2013-05-07 ENCOUNTER — Ambulatory Visit: Payer: Managed Care, Other (non HMO) | Admitting: Endocrinology

## 2013-05-07 ENCOUNTER — Other Ambulatory Visit: Payer: Self-pay | Admitting: Family Medicine

## 2013-05-07 DIAGNOSIS — E119 Type 2 diabetes mellitus without complications: Secondary | ICD-10-CM

## 2013-05-13 ENCOUNTER — Ambulatory Visit: Payer: Managed Care, Other (non HMO)

## 2013-05-13 ENCOUNTER — Ambulatory Visit (INDEPENDENT_AMBULATORY_CARE_PROVIDER_SITE_OTHER): Payer: Managed Care, Other (non HMO) | Admitting: Family Medicine

## 2013-05-13 VITALS — BP 122/76 | HR 107 | Temp 98.5°F | Resp 18 | Ht 72.0 in | Wt 247.0 lb

## 2013-05-13 DIAGNOSIS — E119 Type 2 diabetes mellitus without complications: Secondary | ICD-10-CM

## 2013-05-13 DIAGNOSIS — J329 Chronic sinusitis, unspecified: Secondary | ICD-10-CM

## 2013-05-13 DIAGNOSIS — J029 Acute pharyngitis, unspecified: Secondary | ICD-10-CM

## 2013-05-13 DIAGNOSIS — IMO0001 Reserved for inherently not codable concepts without codable children: Secondary | ICD-10-CM

## 2013-05-13 DIAGNOSIS — R05 Cough: Secondary | ICD-10-CM

## 2013-05-13 DIAGNOSIS — R059 Cough, unspecified: Secondary | ICD-10-CM

## 2013-05-13 DIAGNOSIS — E1165 Type 2 diabetes mellitus with hyperglycemia: Secondary | ICD-10-CM

## 2013-05-13 LAB — POCT CBC
Granulocyte percent: 72.8 %G (ref 37–80)
HCT, POC: 45.7 % (ref 43.5–53.7)
Hemoglobin: 15.6 g/dL (ref 14.1–18.1)
Lymph, poc: 1.4 (ref 0.6–3.4)
MCH, POC: 29.9 pg (ref 27–31.2)
MCHC: 34.1 g/dL (ref 31.8–35.4)
MCV: 87.8 fL (ref 80–97)
MID (cbc): 0.5 (ref 0–0.9)
MPV: 9.8 fL (ref 0–99.8)
POC Granulocyte: 4.9 (ref 2–6.9)
POC LYMPH PERCENT: 20.3 %L (ref 10–50)
POC MID %: 6.9 %M (ref 0–12)
Platelet Count, POC: 252 10*3/uL (ref 142–424)
RBC: 5.21 M/uL (ref 4.69–6.13)
RDW, POC: 13.1 %
WBC: 6.7 10*3/uL (ref 4.6–10.2)

## 2013-05-13 LAB — POCT RAPID STREP A (OFFICE): Rapid Strep A Screen: NEGATIVE

## 2013-05-13 LAB — GLUCOSE, POCT (MANUAL RESULT ENTRY): POC Glucose: 393 mg/dl — AB (ref 70–99)

## 2013-05-13 MED ORDER — INSULIN GLARGINE 100 UNIT/ML ~~LOC~~ SOLN
45.0000 [IU] | Freq: Once | SUBCUTANEOUS | Status: AC
  Administered 2013-05-13: 45 [IU] via SUBCUTANEOUS

## 2013-05-13 MED ORDER — AZITHROMYCIN 250 MG PO TABS: ORAL_TABLET | ORAL | Status: DC

## 2013-05-13 MED ORDER — FLUCONAZOLE 150 MG PO TABS: 150.0000 mg | ORAL_TABLET | Freq: Once | ORAL | Status: DC

## 2013-05-13 MED ORDER — INSULIN GLARGINE 100 UNIT/ML ~~LOC~~ SOLN: 45.0000 [IU] | SUBCUTANEOUS | Status: AC

## 2013-05-13 NOTE — Patient Instructions (Addendum)
Please return to this office on Thursday, May 16, 2013 at 5pm to see Dr. Milus GlazierLauenstein.     You have an appointment on June 02, 2013 at 8:40am with Dr. Shawnee [facility]y at  [facility] [address], [place], [place] [id number]  Please monitor your blood sugar and keep a log to bring with you to this appointment.

## 2013-05-13 NOTE — Progress Notes (Signed)
Subjective:  This chart was scribed for [name], MD by Ashley JacobsBrittany Andrews, [facility] Scribe. The patient was seen in room and the patient's care was started at 10:06 AM.   Patient ID: Russell [name], male    DOB: 1970/02/14, 43 y.o.   MRN: [id number]  05/13/2013  Follow-up   HPI HPI Comments: Russell [name] is a 43 y.o. male who arrives to the [facility] for a follow up.  Pt was advised to see a Endocrinologist at his last visit but he was not contacted by anyone following his visit. He reports having a glucose reading of 160 yesterday and his baseline is 150. His highest reading was a 200. He takes 50 units of insulin in the morning and night. He complains of having sore throat, congestion, non productive cough, and ear pain. Pt tried Catering managerAlka Seltzer without any relief. Denies fever, reports feeling "hot" is normal for him.      Review of Systems  HENT: Positive for congestion, ear pain and sore throat.   Respiratory: Positive for cough.     Past Medical History  Diagnosis Date   Diabetes mellitus without complication    No Known Allergies Current Outpatient Prescriptions  Medication Sig Dispense Refill   ALPRAZolam (XANAX) 0.5 MG tablet Take 1 tablet (0.5 mg total) by mouth 2 (two) times daily as needed for sleep.  60 tablet  5   azithromycin (ZITHROMAX Z-PAK) 250 MG tablet Take as directed on pack  6 tablet  0   cyclobenzaprine (FLEXERIL) 10 MG tablet Take 1 tablet (10 mg total) by mouth at bedtime as needed for muscle spasms.  30 tablet  11   furosemide (LASIX) 40 MG tablet Take 1 tablet (40 mg total) by mouth 2 (two) times daily.  60 tablet  3   gabapentin (NEURONTIN) 600 MG tablet Take 600 mg by mouth every 4 (four) hours.       ibuprofen (ADVIL,MOTRIN) 800 MG tablet Take 1 tablet (800 mg total) by mouth every 8 (eight) hours as needed.  90 tablet  11   Insulin Glargine (LANTUS SOLOSTAR) 100 UNIT/ML Solostar Pen Inject  150 Units into the skin daily. And pen needles 1/day  20 pen  PRN   levothyroxine (SYNTHROID, LEVOTHROID) 50 MCG tablet Take 1 tablet (50 mcg total) by mouth daily.  90 tablet  3   lisinopril (PRINIVIL,ZESTRIL) 10 MG tablet Take 1 tablet (10 mg total) by mouth daily.  90 tablet  3   torsemide (DEMADEX) 20 MG tablet Take 1 tablet (20 mg total) by mouth daily.  30 tablet  1   No current facility-administered medications for this visit.       Objective:    BP 122/76   Pulse 107   Temp(Src) 98.5 F (36.9 C) (Oral)   Resp 18   Ht 6' (1.829 m)   Wt 247 lb (112.038 kg)   BMI 33.49 kg/m2   SpO2 97% Physical Exam Results for orders placed in visit on 05/04/13  POCT UA - MICROSCOPIC ONLY      Result Value Ref Range   WBC, Ur, HPF, POC 0-1     RBC, urine, microscopic 0-1     Bacteria, U Microscopic trace     Mucus, UA neg     Epithelial cells, urine per micros 0-3     Crystals, Ur, HPF, POC neg     Casts, Ur, LPF, POC neg  Yeast, UA neg     Results for orders placed in visit on 05/13/13  POCT CBC      Result Value Ref Range   WBC 6.7  4.6 - 10.2 K/uL   Lymph, poc 1.4  0.6 - 3.4   POC LYMPH PERCENT 20.3  10 - 50 %L   MID (cbc) 0.5  0 - 0.9   POC MID % 6.9  0 - 12 %M   POC Granulocyte 4.9  2 - 6.9   Granulocyte percent 72.8  37 - 80 %G   RBC 5.21  4.69 - 6.13 M/uL   Hemoglobin 15.6  14.1 - 18.1 g/dL   HCT, POC 16.145.7  09.643.5 - 53.7 %   MCV 87.8  80 - 97 fL   MCH, POC 29.9  27 - 31.2 pg   MCHC 34.1  31.8 - 35.4 g/dL   RDW, POC 04.513.1     Platelet Count, POC 252  142 - 424 K/uL   MPV 9.8  0 - 99.8 fL  GLUCOSE, POCT (MANUAL RESULT ENTRY)      Result Value Ref Range   POC Glucose 393 (*) 70 - 99 mg/dl  POCT RAPID STREP A (OFFICE)      Result Value Ref Range   Rapid Strep A Screen Negative  Negative   [facility] reading (PRIMARY) by  Dr. Milus GlazierLauenstein:  CXR hazy infiltrate left lung, poor inspiration.      Assessment & Plan:   Probable bronchitis with rash and uncontrolled blood  sugar.  Cough - Plan: POCT CBC, POCT glucose (manual entry), DG Chest 2 View, azithromycin (ZITHROMAX Z-PAK) 250 MG tablet  Diabetes - Plan: POCT glucose (manual entry), Ambulatory referral to Endocrinology, insulin glargine (LANTUS) injection 45 Units  Acute pharyngitis - Plan: Culture, Group A Strep, POCT rapid strep A, fluconazole (DIFLUCAN) 150 MG tablet  Sinusitis - Plan: azithromycin (ZITHROMAX Z-PAK) 250 MG tablet  Signed, [name], MD

## 2013-05-15 LAB — CULTURE, GROUP A STREP: Organism ID, Bacteria: NORMAL

## 2013-05-22 ENCOUNTER — Encounter: Payer: Self-pay | Admitting: Family Medicine

## 2013-05-22 ENCOUNTER — Emergency Department (HOSPITAL_COMMUNITY): Payer: Managed Care, Other (non HMO)

## 2013-05-22 ENCOUNTER — Ambulatory Visit (INDEPENDENT_AMBULATORY_CARE_PROVIDER_SITE_OTHER): Payer: Managed Care, Other (non HMO) | Admitting: Family Medicine

## 2013-05-22 ENCOUNTER — Encounter (HOSPITAL_COMMUNITY): Payer: Self-pay | Admitting: Emergency Medicine

## 2013-05-22 ENCOUNTER — Emergency Department (HOSPITAL_COMMUNITY)
Admission: EM | Admit: 2013-05-22 | Discharge: 2013-05-22 | Disposition: A | Discharge status: Home / Self Care | Payer: Managed Care, Other (non HMO) | Attending: Emergency Medicine | Admitting: Emergency Medicine

## 2013-05-22 VITALS — BP 126/79 | HR 106 | Temp 98.5°F | Resp 18 | Ht 72.0 in | Wt 246.0 lb

## 2013-05-22 DIAGNOSIS — R1012 Left upper quadrant pain: Secondary | ICD-10-CM

## 2013-05-22 DIAGNOSIS — R739 Hyperglycemia, unspecified: Secondary | ICD-10-CM

## 2013-05-22 DIAGNOSIS — IMO0002 Reserved for concepts with insufficient information to code with codable children: Secondary | ICD-10-CM

## 2013-05-22 DIAGNOSIS — E119 Type 2 diabetes mellitus without complications: Secondary | ICD-10-CM | POA: Insufficient documentation

## 2013-05-22 DIAGNOSIS — Z79899 Other long term (current) drug therapy: Secondary | ICD-10-CM | POA: Insufficient documentation

## 2013-05-22 DIAGNOSIS — R071 Chest pain on breathing: Secondary | ICD-10-CM | POA: Insufficient documentation

## 2013-05-22 DIAGNOSIS — R0789 Other chest pain: Secondary | ICD-10-CM

## 2013-05-22 DIAGNOSIS — Z794 Long term (current) use of insulin: Secondary | ICD-10-CM | POA: Insufficient documentation

## 2013-05-22 DIAGNOSIS — E079 Disorder of thyroid, unspecified: Secondary | ICD-10-CM | POA: Insufficient documentation

## 2013-05-22 DIAGNOSIS — E1065 Type 1 diabetes mellitus with hyperglycemia: Secondary | ICD-10-CM

## 2013-05-22 DIAGNOSIS — I10 Essential (primary) hypertension: Secondary | ICD-10-CM | POA: Insufficient documentation

## 2013-05-22 HISTORY — DX: Essential (primary) hypertension: I10

## 2013-05-22 LAB — URINALYSIS, ROUTINE W REFLEX MICROSCOPIC
Bilirubin Urine: NEGATIVE
Glucose, UA: >1000 mg/dL — AB
Ketones, ur: NEGATIVE mg/dL
Leukocytes, UA: NEGATIVE
NITRITE: NEGATIVE
PROTEIN: 100 mg/dL — AB
SPECIFIC GRAVITY, URINE: 1.029 (ref 1.005–1.030)
UROBILINOGEN UA: 0.2 mg/dL (ref 0.0–1.0)
pH: 5 (ref 5.0–8.0)

## 2013-05-22 LAB — COMPREHENSIVE METABOLIC PANEL
ALBUMIN: 3.2 g/dL — AB (ref 3.5–5.2)
ALT: 23 U/L (ref 0–53)
AST: 19 U/L (ref 0–37)
Alkaline Phosphatase: 106 U/L (ref 39–117)
BUN: 26 mg/dL — AB (ref 6–23)
CO2: 23 mEq/L (ref 19–32)
CREATININE: 0.9 mg/dL (ref 0.50–1.35)
Calcium: 9.2 mg/dL (ref 8.4–10.5)
Chloride: 99 mEq/L (ref 96–112)
GFR calc Af Amer: >90 mL/min (ref >90)
GFR calc non Af Amer: >90 mL/min (ref >90)
Glucose, Bld: 384 mg/dL — ABNORMAL HIGH (ref 70–99)
POTASSIUM: 4.6 meq/L (ref 3.7–5.3)
Sodium: 136 mEq/L — ABNORMAL LOW (ref 137–147)
TOTAL PROTEIN: 6.9 g/dL (ref 6.0–8.3)
Total Bilirubin: 0.3 mg/dL (ref 0.3–1.2)

## 2013-05-22 LAB — CBC WITH DIFFERENTIAL/PLATELET
Basophils Absolute: 0.1 10*3/uL (ref 0.0–0.1)
Basophils Relative: 1 % (ref 0–1)
EOS ABS: 0.3 10*3/uL (ref 0.0–0.7)
Eosinophils Relative: 4 % (ref 0–5)
HCT: 42.2 % (ref 39.0–52.0)
HEMOGLOBIN: 15 g/dL (ref 13.0–17.0)
Lymphocytes Relative: 23 % (ref 12–46)
Lymphs Abs: 1.4 10*3/uL (ref 0.7–4.0)
MCH: 29.1 pg (ref 26.0–34.0)
MCHC: 35.5 g/dL (ref 30.0–36.0)
MCV: 81.8 fL (ref 78.0–100.0)
MONO ABS: 0.4 10*3/uL (ref 0.1–1.0)
MONOS PCT: 6 % (ref 3–12)
NEUTROS ABS: 4.2 10*3/uL (ref 1.7–7.7)
NEUTROS PCT: 66 % (ref 43–77)
Platelets: 253 10*3/uL (ref 150–400)
RBC: 5.16 MIL/uL (ref 4.22–5.81)
RDW: 12.2 % (ref 11.5–15.5)
WBC: 6.3 10*3/uL (ref 4.0–10.5)

## 2013-05-22 LAB — POCT CBC
Granulocyte percent: 74.9 %G (ref 37–80)
HCT, POC: 44.7 % (ref 43.5–53.7)
Hemoglobin: 15.1 g/dL (ref 14.1–18.1)
Lymph, poc: 1.3 (ref 0.6–3.4)
MCH, POC: 29.2 pg (ref 27–31.2)
MCHC: 33.8 g/dL (ref 31.8–35.4)
MCV: 86.4 fL (ref 80–97)
MID (cbc): 0.3 (ref 0–0.9)
MPV: 10.4 fL (ref 0–99.8)
POC Granulocyte: 4.9 (ref 2–6.9)
POC LYMPH PERCENT: 20.2 %L (ref 10–50)
POC MID %: 4.9 %M (ref 0–12)
Platelet Count, POC: 282 10*3/uL (ref 142–424)
RBC: 5.17 M/uL (ref 4.69–6.13)
RDW, POC: 13.5 %
WBC: 6.5 10*3/uL (ref 4.6–10.2)

## 2013-05-22 LAB — TROPONIN I: Troponin I: <0.3 ng/mL (ref <0.30)

## 2013-05-22 LAB — D-DIMER, QUANTITATIVE (NOT AT ARMC): D-Dimer, Quant: 0.69 ug/mL-FEU — ABNORMAL HIGH (ref 0.00–0.48)

## 2013-05-22 LAB — CBG MONITORING, ED
GLUCOSE-CAPILLARY: 376 mg/dL — AB (ref 70–99)
Glucose-Capillary: 249 mg/dL — ABNORMAL HIGH (ref 70–99)
Glucose-Capillary: 166 mg/dL — ABNORMAL HIGH (ref 70–99)
Glucose-Capillary: 204 mg/dL — ABNORMAL HIGH (ref 70–99)

## 2013-05-22 LAB — URINE MICROSCOPIC-ADD ON

## 2013-05-22 LAB — LIPASE, BLOOD: LIPASE: 47 U/L (ref 11–59)

## 2013-05-22 LAB — GLUCOSE, POCT (MANUAL RESULT ENTRY): POC Glucose: 426 mg/dl — AB (ref 70–99)

## 2013-05-22 MED ORDER — OXYCODONE-ACETAMINOPHEN 5-325 MG PO TABS
2.0000 | ORAL_TABLET | Freq: Once | ORAL | Status: AC
  Administered 2013-05-22: 2 via ORAL
  Filled 2013-05-22: qty 2

## 2013-05-22 MED ORDER — METOCLOPRAMIDE HCL 5 MG/ML IJ SOLN
10.0000 mg | Freq: Once | INTRAMUSCULAR | Status: AC
  Administered 2013-05-22: 10 mg via INTRAVENOUS
  Filled 2013-05-22: qty 2

## 2013-05-22 MED ORDER — IOHEXOL 300 MG/ML  SOLN
50.0000 mL | Freq: Once | INTRAMUSCULAR | Status: AC | PRN
  Administered 2013-05-22: 50 mL via ORAL

## 2013-05-22 MED ORDER — ONDANSETRON 4 MG PO TBDP
4.0000 mg | ORAL_TABLET | Freq: Once | ORAL | Status: AC
  Administered 2013-05-22: 4 mg via ORAL
  Filled 2013-05-22: qty 1

## 2013-05-22 MED ORDER — GI COCKTAIL ~~LOC~~
30.0000 mL | Freq: Once | ORAL | Status: DC
  Filled 2013-05-22: qty 30

## 2013-05-22 MED ORDER — HYDROMORPHONE HCL PF 1 MG/ML IJ SOLN
1.0000 mg | Freq: Once | INTRAMUSCULAR | Status: AC
  Administered 2013-05-22: 1 mg via INTRAVENOUS
  Filled 2013-05-22: qty 1

## 2013-05-22 MED ORDER — SODIUM CHLORIDE 0.9 % IV SOLN
1000.0000 mL | Freq: Once | INTRAVENOUS | Status: AC
  Administered 2013-05-22: 1000 mL via INTRAVENOUS

## 2013-05-22 MED ORDER — INSULIN GLARGINE 100 UNIT/ML SOLOSTAR PEN: 50.0000 [IU] | PEN_INJECTOR | Freq: Every day | SUBCUTANEOUS | Status: DC

## 2013-05-22 MED ORDER — IOHEXOL 350 MG/ML SOLN
100.0000 mL | Freq: Once | INTRAVENOUS | Status: AC | PRN
  Administered 2013-05-22: 100 mL via INTRAVENOUS

## 2013-05-22 MED ORDER — ONDANSETRON 4 MG PO TBDP: ORAL_TABLET | ORAL | Status: DC

## 2013-05-22 NOTE — ED Notes (Signed)
Pt reports L abd swelling for last several weeks. Went for follow up appointment today and was told to come here for possible diverticulosis. Reports home CBG was 596 last night at 3am. Denies n/v/d.

## 2013-05-22 NOTE — Discharge Instructions (Signed)

## 2013-05-22 NOTE — Progress Notes (Signed)
Patient ID: Russell [name] MRN: [id number], DOB: [birth year], 43 y.o. Date of Encounter: 05/22/2013, 8:51 AM  Primary Physician: Elvina SidleLAUENSTEIN,KURT, MD  Chief Complaint:  Chief Complaint  Patient presents with   Follow-up    1 week, DM    HPI: 43 y.o. year old male with history below presents with concerns about his blood sugar. He says that last night it measured to be in the 500's. Pt does not report eating any sweets, he only had a steak and a salad. Pt is also complaining of associated fatigue and diaphoresis. He states that yesterday before eating the steak his blood sugar was around 160. However, this morning his blood sugar measured 540. Pt states that he has a scheduled appointment with an endocrinologist (June 02, 2013).   Pt is also complaining of abdominal tenderness predominantly to his left side. He states that the associated pain has been going on for a while. He denies any fevers. Pt's office temperature today is 98.5.   Pt states that he has episodes of low blood sugar that keeps him up in the middle of the night. But ever since his medication was lowered to a lower dosage he stopped having those episodes.    Past Medical History  Diagnosis Date   Diabetes mellitus without complication      Home Meds: Prior to Admission medications   Medication Sig Start Date End Date Taking? Authorizing Provider  ALPRAZolam Prudy Feeler(XANAX) 0.5 MG tablet Take 1 tablet (0.5 mg total) by mouth 2 (two) times daily as needed for sleep. 01/28/13  Yes [name], MD  cyclobenzaprine (FLEXERIL) 10 MG tablet Take 1 tablet (10 mg total) by mouth at bedtime as needed for muscle spasms. 01/28/13  Yes [name], MD  furosemide (LASIX) 40 MG tablet Take 1 tablet (40 mg total) by mouth 2 (two) times daily. 05/01/13  Yes [name], MD  gabapentin (NEURONTIN) 600 MG tablet Take 600 mg by mouth every 4 (four) hours. 12/19/11  Yes [name], MD  ibuprofen (ADVIL,MOTRIN) 800 MG tablet Take 1  tablet (800 mg total) by mouth every 8 (eight) hours as needed. 01/28/13  Yes [name], MD  Insulin Glargine (LANTUS SOLOSTAR) 100 UNIT/ML Solostar Pen Inject 150 Units into the skin daily. And pen needles 1/day 03/27/13  Yes Romero BellingSean Ellison, MD  levothyroxine (SYNTHROID, LEVOTHROID) 50 MCG tablet Take 1 tablet (50 mcg total) by mouth daily. 05/02/13  Yes [name], MD  lisinopril (PRINIVIL,ZESTRIL) 10 MG tablet Take 1 tablet (10 mg total) by mouth daily. 01/28/13  Yes [name], MD  torsemide (DEMADEX) 20 MG tablet Take 1 tablet (20 mg total) by mouth daily. 05/04/13  Yes [name], MD    Allergies: No Known Allergies  History   Social History   Marital Status: Single    Spouse Name: N/A    Number of Children: N/A   Years of Education: N/A   Occupational History   Not on file.   Social History Main Topics   Smoking status: Never Smoker    Smokeless tobacco: Not on file   Alcohol Use: No   Drug Use: No   Sexual Activity: Yes   Other Topics Concern   Not on file   Social History Narrative   No narrative on file     Review of Systems: diaphoresis, cough, night sweats, fatigue,  Constitutional: negative for chills, fever, night sweats, weight changes, or fatigue  HEENT: negative for vision changes, hearing loss, congestion, rhinorrhea, ST, epistaxis,  or sinus pressure Cardiovascular: negative for chest pain or palpitations Respiratory: negative for hemoptysis, wheezing, shortness of breath, or cough Abdominal: negative for abdominal pain, nausea, vomiting, diarrhea, or constipation Dermatological: negative for rash Neurologic: negative for headache, dizziness, or syncope All other systems reviewed and are otherwise negative with the exception to those above and in the HPI.   Physical Exam: Abdominal tenderness noted to LLQ. Blood pressure 126/79, pulse 106, temperature 98.5 F (36.9 C), resp. rate 18, height 6' (1.829 m), weight 246 lb (111.585  kg), SpO2 99.00%., Body mass index is 33.36 kg/(m^2). General: Well developed, well nourished, in no acute distress. Head: Normocephalic, atraumatic, eyes without discharge, sclera non-icteric, nares are without discharge. Bilateral auditory canals clear, TM's are without perforation, pearly grey and translucent with reflective cone of light bilaterally. Oral cavity moist, posterior pharynx without exudate, erythema, peritonsillar abscess, or post nasal drip.  Neck: Supple. No thyromegaly. Full ROM. No lymphadenopathy. Lungs: Clear bilaterally to auscultation without wheezes, rales, or rhonchi. Breathing is unlabored. Heart: RRR with S1 S2. No murmurs, rubs, or gallops appreciated. Abdomen: Soft, non-tender, non-distended with normoactive bowel sounds. No hepatomegaly. Abdominal tenderness to the LUQ and LLQ with light palpation. No obvious abdominal masses.  No discoloration of the skin.  No guarding but there is rebound. Msk:  Strength and tone normal for age. Extremities/Skin: Warm and dry. No clubbing or cyanosis. No edema. No rashes or suspicious lesions. Neuro: Alert and oriented X 3. Moves all extremities spontaneously. Gait is normal. CNII-XII grossly in tact.  His speech is slightly slurred. Psych:  Responds to questions appropriately with a normal affect.   Labs: Results for orders placed in visit on 05/22/13  GLUCOSE, POCT (MANUAL RESULT ENTRY)      Result Value Ref Range   POC Glucose 426 (*) 70 - 99 mg/dl   WBC is 6.5 with no shift, platelets 282, hgb 15.1  ASSESSMENT AND PLAN:  43 y.o. year old male with uncontrolled blood sugar and significant left sided abdominal pain.  This is difficult to explain on the basis of his WBC, but with partner leaving town and increasing nature of pain, I believe ED evaluation is necessary.  I think patient may have diverticulitis, given the initial presentation of bloating a week or so ago.  I personally performed the services described in this  documentation, which was scribed in my presence. The recorded information has been reviewed and is accurate.  Signed, [name], MD 05/22/2013 8:51 AM

## 2013-05-22 NOTE — ED Notes (Signed)
Pt c/o nausea as he was putting his clothes on.  zofran given as ordered.  Crackers and water also given to pt per his request.

## 2013-05-22 NOTE — ED Notes (Signed)
PO trial initiated.  Pt reports nausea is much better

## 2013-05-22 NOTE — ED Provider Notes (Signed)
CSN: [id number]     Arrival date & time 05/22/13  [id number] History   First MD Initiated Contact with Patient 05/22/13 1007     Chief Complaint  Patient presents with  . Abdominal Pain  . Hyperglycemia     (Consider location/radiation/quality/duration/timing/severity/associated sxs/prior Treatment) HPI Reach-year-old male complaining of left-sided chest pain for several days. He states that it radiates down to his abdomen. It is worse with movement and with deep breathing. 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 There was noted that his blood sugar was elevated and he presents here for further evaluation. He denies any cough, nausea, vomiting, diarrhea, fever, or chills. He also denies urinary tract infection symptoms. He has not had similar episodes in the past the  Past Medical History  Diagnosis Date  . Thyroid disease   . Diabetes mellitus without complication   . Hypertension    Past Surgical History  Procedure Laterality Date  . Right ankle fx repair    . Eye surgery     History reviewed. No pertinent family history. History  Substance Use Topics  . Smoking status: Never Smoker   . Smokeless tobacco: Not on file  . Alcohol Use: No    Review of Systems  All other systems reviewed and are negative.     Allergies  Review of patient's allergies indicates no known allergies.  Home Medications   Prior to Admission medications   Medication Sig Start Date End Date Taking? Authorizing Provider  ALPRAZolam Prudy Feeler(XANAX) 0.5 MG tablet Take 0.5 mg by mouth 2 (two) times daily as needed for anxiety.   Yes Historical Provider, MD  furosemide (LASIX) 40 MG tablet Take 40 mg by mouth daily.   Yes Historical Provider, MD  gabapentin (NEURONTIN) 600 MG tablet Take 600 mg by mouth 4 (four) times daily.  12/19/11  Yes [name], MD  ibuprofen (ADVIL,MOTRIN) 800 MG tablet Take 1 tablet (800 mg total) by mouth every 8 (eight) hours as needed. 01/28/13  Yes  [name], MD  insulin glargine (LANTUS) 100 UNIT/ML injection Inject 50 Units into the skin 2 (two) times daily.   Yes Historical Provider, MD  levothyroxine (SYNTHROID, LEVOTHROID) 50 MCG tablet Take 1 tablet (50 mcg total) by mouth daily. 05/02/13  Yes [name], MD  lisinopril (PRINIVIL,ZESTRIL) 10 MG tablet Take 1 tablet (10 mg total) by mouth daily. 01/28/13  Yes [name], MD  torsemide (DEMADEX) 20 MG tablet Take 20 mg by mouth daily.   Yes Historical Provider, MD   BP 161/99  Pulse 84  Temp(Src) 98.3 F (36.8 C) (Oral)  Resp 15  SpO2 96% Physical Exam  Nursing note and vitals reviewed. Constitutional: He is oriented to person, place, and time. He appears well-developed and well-nourished.  HENT:  Head: Normocephalic and atraumatic.  Right Ear: External ear normal.  Left Ear: External ear normal.  Nose: Nose normal.  Mouth/Throat: Oropharynx is clear and moist.  Eyes: Conjunctivae and EOM are normal. Pupils are equal, round, and reactive to light.  Neck: Normal range of motion. Neck supple.  Cardiovascular: Normal rate, regular rhythm, normal heart sounds and intact distal pulses.   Pulmonary/Chest: Effort normal and breath sounds normal. No respiratory distress. He has no wheezes. He exhibits tenderness.    Abdominal: Soft. Bowel sounds are normal. He exhibits no distension and no mass. There is no tenderness. There is no guarding.  Musculoskeletal: Normal range of motion.  Neurological: He is alert and oriented  to person, place, and time. He has normal reflexes. He exhibits normal muscle tone. Coordination normal.  Skin: Skin is warm and dry.  Psychiatric: He has a normal mood and affect. His behavior is normal. Judgment and thought content normal.    ED Course  Procedures (including critical care time) Labs Review Labs Reviewed  COMPREHENSIVE METABOLIC PANEL - Abnormal; Notable for the following:    Sodium 136 (*)    Glucose, Bld 384 (*)    BUN 26  (*)    Albumin 3.2 (*)    All other components within normal limits  URINALYSIS, ROUTINE W REFLEX MICROSCOPIC - Abnormal; Notable for the following:    Glucose, UA >1000 (*)    Hgb urine dipstick SMALL (*)    Protein, ur 100 (*)    All other components within normal limits  D-DIMER, QUANTITATIVE - Abnormal; Notable for the following:    D-Dimer, Quant 0.69 (*)    All other components within normal limits  CBG MONITORING, ED - Abnormal; Notable for the following:    Glucose-Capillary 376 (*)    All other components within normal limits  CBG MONITORING, ED - Abnormal; Notable for the following:    Glucose-Capillary 249 (*)    All other components within normal limits  CBG MONITORING, ED - Abnormal; Notable for the following:    Glucose-Capillary 166 (*)    All other components within normal limits  CBC WITH DIFFERENTIAL  LIPASE, BLOOD  URINE MICROSCOPIC-ADD ON  TROPONIN I    Imaging Review Dg Chest 2 View  05/22/2013   CLINICAL DATA:  Chest pain for 3 weeks, nonsmoker, insulin-dependent diabetes  EXAM: CHEST  2 VIEW  COMPARISON:  Prior studies  FINDINGS: The heart size and mediastinal contours are within normal limits. Both lungs are clear. The visualized skeletal structures are unremarkable.  IMPRESSION: No active cardiopulmonary disease.   Electronically Signed   By: Esperanza Heiraymond  Rubner M.D.   On: 05/22/2013 11:24   Ct Angio Chest Pe W/cm &/or Wo Cm  05/22/2013   CLINICAL DATA:  Left-sided chest pain abdominal pain. Shortness of breath. Hematuria.  EXAM: CT CHEST, ABDOMEN, AND PELVIS WITH CONTRAST  TECHNIQUE: Multidetector CT imaging of the chest, abdomen and pelvis was performed following the standard protocol during bolus administration of intravenous contrast.  CONTRAST:  50mL OMNIPAQUE IOHEXOL 300 MG/ML SOLN, 100mL OMNIPAQUE IOHEXOL 350 MG/ML SOLN  COMPARISON:  None.  FINDINGS: CT CHEST FINDINGS  There are no pulmonary emboli, infiltrates, effusions, or mass lesions. Heart is normal.  No coronary artery calcification. Thoracic aorta is normal. No osseous abnormality.  CT ABDOMEN AND PELVIS FINDINGS  The patient has a horseshoe kidney. There are no renal or ureteral calculi. No renal mass lesions. There is minimal perinephric soft tissue stranding which is nonspecific.  The patient has a dominant right lobe of the liver and a tiny left lobe. This is an anatomic variant. Liver parenchyma is normal. Biliary tree, spleen, and pancreas are normal. The bowel is normal including the terminal ileum and appendix. Bladder is normal. No osseous abnormality.  IMPRESSION: Normal CT scan of the chest.  No pulmonary emboli.  Minimal soft tissue stranding around the horseshoe kidney. This could be due to prior inflammation. There is no discrete evidence of pyelonephritis.  Otherwise, essentially normal exam.   Electronically Signed   By: Geanie CooleyJim  Maxwell M.D.   On: 05/22/2013 13:25   Ct Abdomen Pelvis W Contrast  05/22/2013   CLINICAL DATA:  Left-sided chest pain  abdominal pain. Shortness of breath. Hematuria.  EXAM: CT CHEST, ABDOMEN, AND PELVIS WITH CONTRAST  TECHNIQUE: Multidetector CT imaging of the chest, abdomen and pelvis was performed following the standard protocol during bolus administration of intravenous contrast.  CONTRAST:  50mL OMNIPAQUE IOHEXOL 300 MG/ML SOLN, OMNIPAQUE IOHEXOL 350 MG/ML SOLN  COMPARISON:  None.  FINDINGS: CT CHEST FINDINGS  There are no pulmonary emboli, infiltrates, effusions, or mass lesions. Heart is normal. No coronary artery calcification. Thoracic aorta is normal. No osseous abnormality.  CT ABDOMEN AND PELVIS FINDINGS  The patient has a horseshoe kidney. There are no renal or ureteral calculi. No renal mass lesions. There is minimal perinephric soft tissue stranding which is nonspecific.  The patient has a dominant right lobe of the liver and a tiny left lobe. This is an anatomic variant. Liver parenchyma is normal. Biliary tree, spleen, and pancreas are normal. The  bowel is normal including the terminal ileum and appendix. Bladder is normal. No osseous abnormality.  IMPRESSION: Normal CT scan of the chest.  No pulmonary emboli.  Minimal soft tissue stranding around the horseshoe kidney. This could be due to prior inflammation. There is no discrete evidence of pyelonephritis.  Otherwise, essentially normal exam.   Electronically Signed   By: Geanie Cooley M.D.   On: 05/22/2013 13:25     EKG Interpretation None      MDM   Final diagnoses:  Chest wall pain  Hyperglycemia   43 year old male with history of diabetes who presents today with chest pain, abdominal pain, and hyperglycemia. He is given IV fluids here and his blood sugar has come down. CT of chest and abdomen without acute abnormality.  Pain easily reproducible on repeat exam. I have advised patient of need for follow up and return precautions.    Hilario Quarry, MD 05/22/13 [phone]

## 2013-05-22 NOTE — ED Notes (Signed)
Pt is still nauseous.  Made pt aware that this nurse can notify the EDP re his nausea, but pt declined and states "i will be fine, just give me a minute.  I actually feels better."

## 2013-05-22 NOTE — ED Notes (Signed)
Pt is now eating applesauce.

## 2013-06-06 ENCOUNTER — Other Ambulatory Visit: Payer: Self-pay | Admitting: Family Medicine

## 2013-06-29 ENCOUNTER — Ambulatory Visit (INDEPENDENT_AMBULATORY_CARE_PROVIDER_SITE_OTHER): Payer: Managed Care, Other (non HMO) | Admitting: Internal Medicine

## 2013-06-29 ENCOUNTER — Encounter: Payer: Self-pay | Admitting: Family Medicine

## 2013-06-29 ENCOUNTER — Ambulatory Visit (INDEPENDENT_AMBULATORY_CARE_PROVIDER_SITE_OTHER): Payer: Managed Care, Other (non HMO)

## 2013-06-29 VITALS — BP 108/76 | HR 120 | Temp 98.9°F | Resp 16 | Ht 72.0 in | Wt 250.0 lb

## 2013-06-29 DIAGNOSIS — M25521 Pain in right elbow: Secondary | ICD-10-CM

## 2013-06-29 DIAGNOSIS — M25529 Pain in unspecified elbow: Secondary | ICD-10-CM

## 2013-06-29 DIAGNOSIS — IMO0002 Reserved for concepts with insufficient information to code with codable children: Secondary | ICD-10-CM

## 2013-06-29 DIAGNOSIS — L03113 Cellulitis of right upper limb: Secondary | ICD-10-CM

## 2013-06-29 MED ORDER — INDOMETHACIN 50 MG PO CAPS: 50.0000 mg | ORAL_CAPSULE | Freq: Three times a day (TID) | ORAL | Status: DC

## 2013-06-29 MED ORDER — HYDROCODONE-ACETAMINOPHEN 5-325 MG PO TABS: 1.0000 | ORAL_TABLET | Freq: Four times a day (QID) | ORAL | Status: DC | PRN

## 2013-06-29 MED ORDER — SULFAMETHOXAZOLE-TRIMETHOPRIM 800-160 MG PO TABS: 1.0000 | ORAL_TABLET | Freq: Two times a day (BID) | ORAL | Status: DC

## 2013-06-29 NOTE — Patient Instructions (Signed)
Start the Bactrim tonight - take as directed twice daily  Take the indomethacin (Indocin) every 8 hours with food for pain/inflammtion  Take the Norco every 6 hours as needed for pain  Please recheck here on Tuesday - sooner if any symptoms are worsening

## 2013-06-29 NOTE — Progress Notes (Signed)
   Subjective:    Patient ID: Russell [name], male    DOB: 1970-12-01, 43 y.o.   MRN: [id number]  HPI   Russell [name] is a very pleasant 43 yr old male here with acute onset of RIGHT elbow pain today.  The elbow is red, hot, and very painful.  He denies injury to the elbow.  He has never had anything like this before.  No gout history.  Fever tmax 100.24F today.  +chills.  Taking motrin with no relief.  Hurts to touch the arm.  Hurts to bend and straighten the arm. He was in his usual state of health until today   DM2 - pt reports under good control; Apidra and Lantus    Review of Systems  Constitutional: Positive for fever and chills.  Respiratory: Negative.   Cardiovascular: Negative.   Gastrointestinal: Negative.   Musculoskeletal: Positive for arthralgias and joint swelling.  Skin: Positive for color change.  Neurological: Negative for weakness and numbness.       Objective:   Physical Exam  Vitals reviewed. Constitutional: He is oriented to person, place, and time. He appears well-developed and well-nourished. No distress.  HENT:  Head: Normocephalic and atraumatic.  Eyes: Conjunctivae are normal. No scleral icterus.  Cardiovascular: Regular rhythm and normal heart sounds.  Tachycardia present.   Pulmonary/Chest: Effort normal and breath sounds normal. He has no wheezes. He has no rales.  Musculoskeletal:       Right elbow: He exhibits decreased range of motion and swelling. Tenderness found.       Arms: Pt is seated with RIGHT elbow flexed and the shoulder abducted; RIGHT elbow markedly tender with even light palpation; there is a scabbed area over the olecranon process - pt unsure how this developed but has been present for a couple weeks; RIGHT elbow swollen; distal pulses intact; limited flexion and extension due to pain  Neurological: He is alert and oriented to person, place, and time.  Skin: Skin is warm and dry.  Psychiatric: He has a normal mood and affect. His behavior  is normal.    [facility] reading (PRIMARY) by  Dr. Merla Richesoolittle - negative  Elbow aspiration from olecranon bursa w/ 18g by Dr. Merla Richesoolittle yielded scant fluid/blood but has been sent for culture     Assessment & Plan:  Cellulitis of arm, right - Plan: sulfamethoxazole-trimethoprim (BACTRIM DS,SEPTRA DS) 800-160 MG per tablet, Wound culture  Right elbow pain - Plan: DG Elbow Complete Right, HYDROcodone-acetaminophen (NORCO) 5-325 MG per tablet, indomethacin (INDOCIN) 50 MG capsule   Russell [name] is a very pleasant 43 yr old male with acute monoarticular arthritis- suspicious for infection but cannot rule out gout.  Xrays are negative today.  Cx of elbow aspirate sent.  Will start TMP/SMX empirically.  Indocin and Norco for pain/inflammation.  Work note provided.  Pt to follow up here 07/01/13 - sooner if any concerns  Pt to call or RTC if worsening or not improving  Case discussed and pt examined with Dr. Rochele Raringoolittle  E. Elizabeth Egan MHS, PA-C [facility] 6/28/20157:10 PM  I have completed the patient encounter in its entirety as documented by [facility] Debbra RidingEgan, with editing by me where necessary. Robert P. Merla Richesoolittle, M.D.

## 2013-07-01 ENCOUNTER — Ambulatory Visit (INDEPENDENT_AMBULATORY_CARE_PROVIDER_SITE_OTHER): Payer: Managed Care, Other (non HMO) | Admitting: Physician Assistant

## 2013-07-01 VITALS — BP 137/88 | HR 110 | Temp 99.0°F | Resp 16 | Ht 72.5 in | Wt 249.0 lb

## 2013-07-01 DIAGNOSIS — M25521 Pain in right elbow: Secondary | ICD-10-CM

## 2013-07-01 DIAGNOSIS — M25529 Pain in unspecified elbow: Secondary | ICD-10-CM

## 2013-07-01 DIAGNOSIS — IMO0002 Reserved for concepts with insufficient information to code with codable children: Secondary | ICD-10-CM

## 2013-07-01 MED ORDER — CLINDAMYCIN HCL 300 MG PO CAPS: 300.0000 mg | ORAL_CAPSULE | Freq: Four times a day (QID) | ORAL | Status: DC

## 2013-07-01 NOTE — Patient Instructions (Signed)
Continue taking the Bactrim/Septra twice daily  Begin taking the clindamycin 4 times daily - start tonight.  Take with food to help reduce stomach upset  Use the pain medicine as needed.  Keep the elbow wrapped if that feels best  I will try to get you into ortho on Thursday - if we can't get that set up, I want you to follow up here on Thursday (I am here 8am-12pm but you can come whenever works with your schedule)  Out of work for the rest of the week  Please come in sooner if any symptoms are worsening

## 2013-07-02 LAB — CBC
HEMATOCRIT: 41.1 % (ref 39.0–52.0)
Hemoglobin: 14.8 g/dL (ref 13.0–17.0)
MCH: 29.2 pg (ref 26.0–34.0)
MCHC: 36 g/dL (ref 30.0–36.0)
MCV: 81.2 fL (ref 78.0–100.0)
PLATELETS: 285 10*3/uL (ref 150–400)
RBC: 5.06 MIL/uL (ref 4.22–5.81)
RDW: 13.1 % (ref 11.5–15.5)
WBC: 6.9 10*3/uL (ref 4.0–10.5)

## 2013-07-02 LAB — WOUND CULTURE
GRAM STAIN: NONE SEEN
Gram Stain: NONE SEEN
Gram Stain: NONE SEEN
ORGANISM ID, BACTERIA: NO GROWTH

## 2013-07-02 LAB — URIC ACID: Uric Acid, Serum: 6.4 mg/dL (ref 4.0–7.8)

## 2013-07-02 NOTE — Progress Notes (Signed)
   Subjective:    Patient ID: Russell [name], male    DOB: 1970-05-03, 43 y.o.   MRN: [id number]  HPI   Russell [name] is a very pleasant 43 yr old male here for follow up on a presumed cellulitis of the RIGHT elbow.  Pt was initially seen here 06/29/13 at which time he had developed an acutely painful, red, warm RIGHT elbow.  Suspected gout vs infection.  He was started on TMP/SMX, indomethacin, and Norco  Today he reports he is not improving - but also not worsening.  He continues to have pain, swelling and redness in the RIGHT elbow.  He continues to run low grade fevers.  He has been taking both the indomethacin and Norco as directed.    Review of Systems  Constitutional: Positive for fever.  Respiratory: Negative.   Cardiovascular: Negative.   Musculoskeletal: Positive for arthralgias.  Skin: Positive for color change.       Objective:   Physical Exam  Vitals reviewed. Constitutional: He is oriented to person, place, and time. He appears well-developed and well-nourished. No distress.  HENT:  Head: Normocephalic and atraumatic.  Eyes: Conjunctivae are normal. No scleral icterus.  Pulmonary/Chest: Effort normal.  Musculoskeletal:       Right elbow: He exhibits decreased range of motion and swelling. Tenderness found.       Arms: RIGHT elbow continues to be quite tender with even light palpation; the skin is less intensely red and warm, but the redness has spread proximally and distally; ROM continues to be limited by pain  Neurological: He is alert and oriented to person, place, and time.  Skin: Skin is warm and dry.  Psychiatric: He has a normal mood and affect. His behavior is normal.         Assessment & Plan:  Cellulitis and abscess of upper arm and forearm - Plan: CBC, Uric Acid, clindamycin (CLEOCIN) 300 MG capsule, Ambulatory referral to Orthopedic Surgery  Elbow pain, right - Plan: CBC, Uric Acid, Ambulatory referral to Orthopedic Surgery   Russell [name] is a very  pleasant 43 yr old male with cellulitis of the RIGHT elbow - no improvement with TMP/SMX.  Will add clindamycin 300mg  QID.  Continue indomethacin and Norco.  Seems much less likely to be gout as no improvement with NSAIDs but have sent uric acid along with CBC today.  I have also placed an urgent referral to ortho - hopefully he will be able to see them in 48 hours.  If we cannot get him to ortho, he will follow up here on 07/03/13 - sooner if worse.  I have outlined the cellulitis today   E. Frances FurbishElizabeth Aralynn Brake MHS, PA-C [facility] 7/1/20158:42 AM

## 2013-07-03 ENCOUNTER — Telehealth: Payer: Self-pay | Admitting: Physician Assistant

## 2013-07-03 NOTE — Telephone Encounter (Signed)
Spoke with pt.  He saw [facility] this AM.  He reports they placed him in a splint and gave an injection of rocephin.  He continues to take TMP/SMX and clinda. He reports slight improvement.  He will see ortho again Monday 07/07/13.   If any concerns prior to that, he will RTC here

## 2013-07-10 ENCOUNTER — Telehealth: Payer: Self-pay

## 2013-07-10 NOTE — Telephone Encounter (Signed)
FYI

## 2013-07-10 NOTE — Telephone Encounter (Signed)
Russell PersonEliz Egan   Patient had follow up with Dr. For his arm - everything is okay.  Will have paperwork for you within the next few days.  Understands he is to pay the $15 for Disability filing.    [phone]

## 2013-07-15 ENCOUNTER — Ambulatory Visit (INDEPENDENT_AMBULATORY_CARE_PROVIDER_SITE_OTHER): Payer: Managed Care, Other (non HMO) | Admitting: Physician Assistant

## 2013-07-15 VITALS — BP 148/94 | HR 93 | Temp 98.1°F | Resp 18 | Ht 72.0 in | Wt 245.0 lb

## 2013-07-15 DIAGNOSIS — M7021 Olecranon bursitis, right elbow: Secondary | ICD-10-CM

## 2013-07-15 DIAGNOSIS — M7020 Olecranon bursitis, unspecified elbow: Secondary | ICD-10-CM

## 2013-07-15 NOTE — Progress Notes (Signed)
   Subjective:    Patient ID: Russell [name], male    DOB: Apr 10, 1970, 43 y.o.   MRN: [id number]  HPI   Russell [name] is a very pleasant 43 yr old male here for follow up.  I first saw the patient 06/29/13 at which time he had developed an acutely painful, red, swollen RIGHT elbow.  He had associated fevers and chills.  He was started on antibiotics and subsequently referred to orthopedics.  Notes from Dr. Althea CharonMcKinley have been reviewed.  Today pt states he is MUCH improved.  He has good ROM.  No swelling.  Minimal redness.  No further fevers or chills.  He has occasional pain if he bumps the elbow but is otherwise pain free.  Dr. Althea CharonMcKinley continued him on clindamycin 150mg  TID and Bactrim DS 1 tab BID for the next week.  He plans to return to work full duty 07/21/13.   He is here today for completion of paperwork for short term disability   Review of Systems  Constitutional: Negative for fever and chills.  Respiratory: Negative.   Cardiovascular: Negative.   Musculoskeletal: Negative for arthralgias, joint swelling and myalgias.  Skin: Negative for color change and wound.       Objective:   Physical Exam  Vitals reviewed. Constitutional: He is oriented to person, place, and time. He appears well-developed and well-nourished. No distress.  HENT:  Head: Normocephalic and atraumatic.  Pulmonary/Chest: Effort normal.  Musculoskeletal:       Right elbow: He exhibits normal range of motion, no swelling and no deformity. No tenderness found.  Neurological: He is alert and oriented to person, place, and time.  Skin: Skin is warm and dry.  Psychiatric: He has a normal mood and affect. His behavior is normal.        Assessment & Plan:  Olecranon bursitis, right   Russell [name] is a very pleasant 43 yr old male here for follow up on RIGHT olecranon bursitis.  He is much improved from initial visit - notes from Dr. Althea CharonMcKinley reviewed.  PPW completed for short term disability from 06/29/13 - 07/21/13.   I completed ppw [place] [employer] as well as pp[place] that pt brought with him.  Copies have been made and sent for scanning.  Pt will return to full duty 07/21/13.  Finish abx as directed.  RTC if concerns   Russell [name] MHS, PA-C [facility] 7/14/20154:03 PM

## 2013-09-08 ENCOUNTER — Other Ambulatory Visit: Payer: Self-pay | Admitting: Family Medicine

## 2013-09-09 ENCOUNTER — Ambulatory Visit: Payer: Managed Care, Other (non HMO) | Admitting: Family Medicine

## 2013-09-09 NOTE — Telephone Encounter (Signed)
Called in.

## 2013-09-12 ENCOUNTER — Encounter: Payer: Self-pay | Admitting: Family Medicine

## 2013-09-12 ENCOUNTER — Ambulatory Visit (INDEPENDENT_AMBULATORY_CARE_PROVIDER_SITE_OTHER): Payer: Managed Care, Other (non HMO) | Admitting: Family Medicine

## 2013-09-12 VITALS — BP 156/84 | HR 102 | Temp 98.0°F | Resp 18 | Ht 71.5 in | Wt 259.0 lb

## 2013-09-12 DIAGNOSIS — E119 Type 2 diabetes mellitus without complications: Secondary | ICD-10-CM

## 2013-09-12 LAB — LIPID PANEL
Cholesterol: 178 mg/dL (ref 0–200)
HDL: 42 mg/dL (ref >39)
LDL Cholesterol: 98 mg/dL (ref 0–99)
Total CHOL/HDL Ratio: 4.2 Ratio
Triglycerides: 189 mg/dL — ABNORMAL HIGH (ref <150)
VLDL: 38 mg/dL (ref 0–40)

## 2013-09-12 LAB — COMPREHENSIVE METABOLIC PANEL
ALT: 30 U/L (ref 0–53)
AST: 33 U/L (ref 0–37)
Albumin: 3.5 g/dL (ref 3.5–5.2)
Alkaline Phosphatase: 68 U/L (ref 39–117)
BUN: 22 mg/dL (ref 6–23)
CO2: 28 mEq/L (ref 19–32)
Calcium: 8.7 mg/dL (ref 8.4–10.5)
Chloride: 104 mEq/L (ref 96–112)
Creat: 0.87 mg/dL (ref 0.50–1.35)
Glucose, Bld: 203 mg/dL — ABNORMAL HIGH (ref 70–99)
Potassium: 4.4 mEq/L (ref 3.5–5.3)
Sodium: 138 mEq/L (ref 135–145)
Total Bilirubin: 0.5 mg/dL (ref 0.2–1.2)
Total Protein: 5.8 g/dL — ABNORMAL LOW (ref 6.0–8.3)

## 2013-09-12 NOTE — Progress Notes (Signed)
43 yo diabetic patient.  His blood sugars are much more level at 110-120 consistently.  The Lantus is still 60 units per day.  He is seeing Ginnie Smart in [place].  Patient is planning on using an insulin pump.  He is watching his carbs.  He is concerned about his weight gain.  Objective:  Alert, happy, appropriate. HEENT: normal fundi, ears Chest:  Few left sided basilar rales Heart:  Reg, 100, no murmur, no gallop Abdomen: soft, no HSM, ecchymosis at insulin injection site RUQ Ext:  Numb feet.  No edema, well healed right olecranon scar  Assessment:  Doing extremely well  Plan:  Continue present plan.  Follow up two weeks with Dr. Shawnee Knapp Type II or unspecified type diabetes mellitus without mention of complication, not stated as uncontrolled - Plan: HM Diabetes Foot Exam, Lipid panel, Comprehensive metabolic panel    [name], MD

## 2013-09-27 ENCOUNTER — Other Ambulatory Visit: Payer: Self-pay | Admitting: Family Medicine

## 2013-10-16 ENCOUNTER — Encounter: Payer: Self-pay | Admitting: *Deleted

## 2013-10-22 ENCOUNTER — Ambulatory Visit (INDEPENDENT_AMBULATORY_CARE_PROVIDER_SITE_OTHER): Payer: Managed Care, Other (non HMO)

## 2013-10-22 ENCOUNTER — Ambulatory Visit (INDEPENDENT_AMBULATORY_CARE_PROVIDER_SITE_OTHER): Payer: Managed Care, Other (non HMO) | Admitting: Family Medicine

## 2013-10-22 VITALS — BP 156/100 | HR 116 | Temp 98.9°F | Resp 20 | Ht 72.0 in | Wt 260.2 lb

## 2013-10-22 DIAGNOSIS — J189 Pneumonia, unspecified organism: Secondary | ICD-10-CM

## 2013-10-22 DIAGNOSIS — R062 Wheezing: Secondary | ICD-10-CM

## 2013-10-22 DIAGNOSIS — R05 Cough: Secondary | ICD-10-CM

## 2013-10-22 DIAGNOSIS — R0602 Shortness of breath: Secondary | ICD-10-CM

## 2013-10-22 DIAGNOSIS — R059 Cough, unspecified: Secondary | ICD-10-CM

## 2013-10-22 LAB — POCT CBC
Granulocyte percent: 72.2 %G (ref 37–80)
HCT, POC: 47 % (ref 43.5–53.7)
Hemoglobin: 15.2 g/dL (ref 14.1–18.1)
Lymph, poc: 1.9 (ref 0.6–3.4)
MCH, POC: 28.5 pg (ref 27–31.2)
MCHC: 32.4 g/dL (ref 31.8–35.4)
MCV: 88.1 fL (ref 80–97)
MID (cbc): 0.5 (ref 0–0.9)
MPV: 7.7 fL (ref 0–99.8)
POC Granulocyte: 6.3 (ref 2–6.9)
POC LYMPH PERCENT: 21.9 %L (ref 10–50)
POC MID %: 5.9 %M (ref 0–12)
Platelet Count, POC: 275 10*3/uL (ref 142–424)
RBC: 5.33 M/uL (ref 4.69–6.13)
RDW, POC: 13.9 %
WBC: 8.7 10*3/uL (ref 4.6–10.2)

## 2013-10-22 LAB — GLUCOSE, POCT (MANUAL RESULT ENTRY): POC Glucose: 71 mg/dl (ref 70–99)

## 2013-10-22 LAB — POCT GLYCOSYLATED HEMOGLOBIN (HGB A1C): Hemoglobin A1C: 7.1

## 2013-10-22 MED ORDER — LEVOFLOXACIN 500 MG PO TABS: 500.0000 mg | ORAL_TABLET | Freq: Every day | ORAL | Status: DC

## 2013-10-22 MED ORDER — ALBUTEROL SULFATE HFA 108 (90 BASE) MCG/ACT IN AERS: 2.0000 | INHALATION_SPRAY | Freq: Four times a day (QID) | RESPIRATORY_TRACT | Status: DC | PRN

## 2013-10-22 NOTE — Patient Instructions (Signed)
You have a right upper lobe pneumonia.  I need you to come in this weekend for a recheck.

## 2013-10-22 NOTE — Progress Notes (Addendum)
Patient ID: Russell [name] MRN: [id number], DOB: Sep 09, 1970, 43 y.o. Date of Encounter: 10/22/2013, 5:22 PM  Primary Physician: [name], MD  Chief Complaint:  Chief Complaint  Patient presents with  . Wheezing    dry cough, low grade fever x 3-4 days ago  . Shortness of Breath  . Chills    HPI: 43 y.o. year old male presents with a 7 day history of nasal congestion, post nasal drip, sore throat, and cough. Mild sinus pressure. Afebrile. No chills. Nasal congestion thick and green/yellow. Cough is productive of green/yellow sputum and not associated with time of day. Ears feel full, leading to sensation of muffled hearing. Has tried OTC cold preps without success. No GI complaints.   No sick contacts, recent antibiotics, or recent travels.   No leg trauma, sedentary periods, h/o cancer, or tobacco use.  Past Medical History  Diagnosis Date  . Thyroid disease   . Diabetes mellitus without complication   . Hypertension      Home Meds: Prior to Admission medications   Medication Sig Start Date End Date Taking? Authorizing Provider  ALPRAZolam Duanne Moron) 0.5 MG tablet TAKE 1 TABLET TWICE DAILY AS NEEDED FOR SLEEP 09/08/13  Yes [name], MD  Blood Glucose Monitoring Suppl (ONETOUCH VERIO IQ SYSTEM) W/DEVICE KIT 1 each by Does not apply route. 06/02/13  Yes Historical Provider, MD  furosemide (LASIX) 40 MG tablet Take 40 mg by mouth daily.   Yes Historical Provider, MD  gabapentin (NEURONTIN) 600 MG tablet Take 600 mg by mouth 4 (four) times daily.  12/19/11  Yes [name], MD  glucose blood [facility] VERIO) test strip 7-8 times per day 06/02/13 06/02/14 Yes Historical Provider, MD  HYDROcodone-acetaminophen (NORCO) 5-325 MG per tablet Take 1 tablet by mouth every 6 (six) hours as needed. 06/29/13  Yes Eleanore E Elana Alm, PA-C  ibuprofen (ADVIL,MOTRIN) 800 MG tablet Take 1 tablet (800 mg total) by mouth every 8 (eight) hours as needed. 01/28/13  Yes [name], MD    indomethacin (INDOCIN) 50 MG capsule Take 1 capsule (50 mg total) by mouth 3 (three) times daily with meals. 06/29/13  Yes Eleanore E Egan, PA-C  insulin glargine (LANTUS) 100 UNIT/ML injection Inject 60 Units into the skin 2 (two) times daily.    Yes Historical Provider, MD  Insulin Glulisine (APIDRA SOLOSTAR) 100 UNIT/ML Solostar Pen Inject 15 Units into the skin. 06/19/13  Yes Historical Provider, MD  levothyroxine (SYNTHROID, LEVOTHROID) 50 MCG tablet Take 1 tablet (50 mcg total) by mouth daily. 05/02/13  Yes [name], MD  lisinopril (PRINIVIL,ZESTRIL) 10 MG tablet TAKE 1 TABLET (10 MG TOTAL) BY MOUTH DAILY. 09/29/13  Yes [name], MD  metFORMIN (GLUCOPHAGE) 1000 MG tablet Take 1,000 mg by mouth 2 (two) times daily with a meal.  06/02/13 06/02/14 Yes Historical Provider, MD  torsemide (DEMADEX) 20 MG tablet Take 20 mg by mouth daily.   Yes Historical Provider, MD    Allergies: No Known Allergies  History   Social History  . Marital Status: Single    Spouse Name: N/A    Number of Children: N/A  . Years of Education: N/A   Occupational History  . Not on file.   Social History Main Topics  . Smoking status: Never Smoker   . Smokeless tobacco: Not on file  . Alcohol Use: No  . Drug Use: No  . Sexual Activity: Yes   Other Topics Concern  . Not on file   Social History Narrative  .  No narrative on file     Review of Systems: Constitutional: positive for chills, fever, night sweats Cardiovascular: negative for chest pain or palpitations Respiratory: negative for hemoptysis, positive for wheezing and shortness of breath Abdominal: negative for abdominal pain, nausea, vomiting or diarrhea Dermatological: negative for rash Neurologic: negative for headache   Physical Exam: Blood pressure 156/100, pulse 116, temperature 98.9 F (37.2 C), temperature source Oral, resp. rate 20, height 6' (1.829 m), weight 260 lb 3.2 oz (118.026 kg), SpO2 97.00%., Body mass index is  35.28 kg/(m^2). General: Well developed, well nourished, in no acute distress. Head: Normocephalic, atraumatic, eyes without discharge, sclera non-icteric, nares are congested. Bilateral auditory canals clear, TM's are without perforation, pearly grey with reflective cone of light bilaterally. No sinus TTP. Oral cavity moist, dentition normal. Posterior pharynx with post nasal drip and mild erythema. No peritonsillar abscess or tonsillar exudate. Neck: Supple. No thyromegaly. Full ROM. No lymphadenopathy. Lungs: decreased BS bilaterally with faint expiratory wheezes Heart: RRR with S1 S2. No murmurs, rubs, or gallops appreciated. Msk:  Strength and tone normal for age. Extremities: No clubbing or cyanosis. No edema. Neuro: Alert and oriented X 3. Moves all extremities spontaneously. CNII-XII grossly in tact. Psych:  Responds to questions appropriately with a normal affect.   Labs: Results for orders placed in visit on 10/22/13  POCT CBC      Result Value Ref Range   WBC 8.7  4.6 - 10.2 K/uL   Lymph, poc 1.9  0.6 - 3.4   POC LYMPH PERCENT 21.9  10 - 50 %L   MID (cbc) 0.5  0 - 0.9   POC MID % 5.9  0 - 12 %M   POC Granulocyte 6.3  2 - 6.9   Granulocyte percent 72.2  37 - 80 %G   RBC 5.33  4.69 - 6.13 M/uL   Hemoglobin 15.2  14.1 - 18.1 g/dL   HCT, POC 47.0  43.5 - 53.7 %   MCV 88.1  80 - 97 fL   MCH, POC 28.5  27 - 31.2 pg   MCHC 32.4  31.8 - 35.4 g/dL   RDW, POC 13.9     Platelet Count, POC 275  142 - 424 K/uL   MPV 7.7  0 - 99.8 fL  GLUCOSE, POCT (MANUAL RESULT ENTRY)      Result Value Ref Range   POC Glucose 71  70 - 99 mg/dl  POCT GLYCOSYLATED HEMOGLOBIN (HGB A1C)      Result Value Ref Range   Hemoglobin A1C 7.1     [facility] reading (PRIMARY) by  Dr. Joseph Art:  CXR-right upper hilar peribronchial density..   ASSESSMENT AND PLAN:  43 y.o. year old male with bronchitis, probably early pneumonia -Cough - Plan: DG Chest 2 View, POCT CBC, POCT glucose (manual entry), POCT  glycosylated hemoglobin (Hb A1C), levofloxacin (LEVAQUIN) 500 MG tablet, albuterol (PROVENTIL HFA;VENTOLIN HFA) 108 (90 BASE) MCG/ACT inhaler  Wheezing - Plan: DG Chest 2 View, POCT CBC, POCT glucose (manual entry), POCT glycosylated hemoglobin (Hb A1C), levofloxacin (LEVAQUIN) 500 MG tablet, albuterol (PROVENTIL HFA;VENTOLIN HFA) 108 (90 BASE) MCG/ACT inhaler  SOB (shortness of breath) - Plan: DG Chest 2 View, POCT CBC, POCT glucose (manual entry), POCT glycosylated hemoglobin (Hb A1C), levofloxacin (LEVAQUIN) 500 MG tablet, albuterol (PROVENTIL HFA;VENTOLIN HFA) 108 (90 BASE) MCG/ACT inhaler  CAP (community acquired pneumonia) - Plan: levofloxacin (LEVAQUIN) 500 MG tablet, albuterol (PROVENTIL HFA;VENTOLIN HFA) 108 (90 BASE) MCG/ACT inhaler   -Mucinex -Tylenol/Motrin prn -Rest/fluids -RTC  precautions -RTC 3-5 days if no improvement  Signed, [name], MD 10/22/2013 5:22 PM

## 2013-10-26 ENCOUNTER — Ambulatory Visit (INDEPENDENT_AMBULATORY_CARE_PROVIDER_SITE_OTHER): Payer: Managed Care, Other (non HMO) | Admitting: Family Medicine

## 2013-10-26 ENCOUNTER — Ambulatory Visit (INDEPENDENT_AMBULATORY_CARE_PROVIDER_SITE_OTHER): Payer: Managed Care, Other (non HMO)

## 2013-10-26 VITALS — BP 146/94 | HR 105 | Temp 98.2°F | Resp 17 | Ht 72.0 in | Wt 261.0 lb

## 2013-10-26 DIAGNOSIS — R059 Cough, unspecified: Secondary | ICD-10-CM

## 2013-10-26 DIAGNOSIS — R0602 Shortness of breath: Secondary | ICD-10-CM

## 2013-10-26 DIAGNOSIS — R062 Wheezing: Secondary | ICD-10-CM

## 2013-10-26 DIAGNOSIS — R05 Cough: Secondary | ICD-10-CM

## 2013-10-26 DIAGNOSIS — R509 Fever, unspecified: Secondary | ICD-10-CM

## 2013-10-26 DIAGNOSIS — J189 Pneumonia, unspecified organism: Secondary | ICD-10-CM

## 2013-10-26 LAB — POCT CBC
Granulocyte percent: 67.5 %G (ref 37–80)
HCT, POC: 46.5 % (ref 43.5–53.7)
Hemoglobin: 15.2 g/dL (ref 14.1–18.1)
Lymph, poc: 1.5 (ref 0.6–3.4)
MCH, POC: 28.1 pg (ref 27–31.2)
MCHC: 32.7 g/dL (ref 31.8–35.4)
MCV: 86 fL (ref 80–97)
MID (cbc): 0.4 (ref 0–0.9)
MPV: 7.8 fL (ref 0–99.8)
POC Granulocyte: 3.9 (ref 2–6.9)
POC LYMPH PERCENT: 25.6 %L (ref 10–50)
POC MID %: 6.9 %M (ref 0–12)
Platelet Count, POC: 242 10*3/uL (ref 142–424)
RBC: 5.41 M/uL (ref 4.69–6.13)
RDW, POC: 13.4 %
WBC: 5.8 10*3/uL (ref 4.6–10.2)

## 2013-10-26 LAB — POCT INFLUENZA A/B
Influenza A, POC: NEGATIVE
Influenza B, POC: NEGATIVE

## 2013-10-26 MED ORDER — LEVOFLOXACIN 500 MG PO TABS: 500.0000 mg | ORAL_TABLET | Freq: Two times a day (BID) | ORAL | Status: DC

## 2013-10-26 MED ORDER — LEVOFLOXACIN 500 MG PO TABS
500.0000 mg | ORAL_TABLET | Freq: Two times a day (BID) | ORAL | Status: DC
Start: 2013-10-26 — End: 2013-10-26

## 2013-10-26 NOTE — Patient Instructions (Addendum)
Use the inhaler using one puff every 4 hours as needed Take the Levaquin twice a day for the next several days Stay out of work and rest the next 2 days

## 2013-10-26 NOTE — Progress Notes (Addendum)
Subjective:  This chart was scribed for [name], MD by Dellis Filbert, ED Scribe at [facility].The patient was seen in exam room 13 and the patient's care was started at 10:05 AM.   Patient ID: Russell [name], male    DOB: 05/26/1970, 43 y.o.   MRN: [id number]  HPI HPI Comments: Russell [name] is a 43 y.o. male with a history of DM who presents to [facility] for a follow up because of pneumonia that has been ongoing for about 5 days. He has trouble sleeping at night, a productive/presistent cough, and a low grade fever as associated symptoms. Pt does use an inhaler but notes it makes him shaky. Pt notes his blood sugar levels are fine. Pt states he did get the flu shot. Pt works in [employer], educational.  Patient Active Problem List   Diagnosis Date Noted  . Anxiety state, unspecified 03/12/2013  . Type 2 diabetes mellitus 12/19/2011  . Hyperlipidemia 12/19/2011  . Varicose veins 12/19/2011  . Back pain, chronic 12/19/2011   Past Medical History  Diagnosis Date  . Thyroid disease   . Diabetes mellitus without complication   . Hypertension    Past Surgical History  Procedure Laterality Date  . Right ankle fx repair    . Eye surgery     No Known Allergies Prior to Admission medications   Medication Sig Start Date End Date Taking? Authorizing Provider  albuterol (PROVENTIL HFA;VENTOLIN HFA) 108 (90 BASE) MCG/ACT inhaler Inhale 2 puffs into the lungs every 6 (six) hours as needed for wheezing or shortness of breath. 10/22/13  Yes [name], MD  ALPRAZolam Duanne Moron) 0.5 MG tablet TAKE 1 TABLET TWICE DAILY AS NEEDED FOR SLEEP 09/08/13  Yes [name], MD  Blood Glucose Monitoring Suppl (ONETOUCH VERIO IQ SYSTEM) W/DEVICE KIT 1 each by Does not apply route. 06/02/13  Yes Historical Provider, MD  furosemide (LASIX) 40 MG tablet Take 40 mg by mouth daily.   Yes Historical Provider, MD  gabapentin (NEURONTIN) 600 MG tablet Take 600 mg by mouth 4 (four) times daily.   12/19/11  Yes [name], MD  glucose blood [facility] VERIO) test strip 7-8 times per day 06/02/13 06/02/14 Yes Historical Provider, MD  HYDROcodone-acetaminophen (NORCO) 5-325 MG per tablet Take 1 tablet by mouth every 6 (six) hours as needed. 06/29/13  Yes Eleanore E Elana Alm, PA-C  ibuprofen (ADVIL,MOTRIN) 800 MG tablet Take 1 tablet (800 mg total) by mouth every 8 (eight) hours as needed. 01/28/13  Yes [name], MD  indomethacin (INDOCIN) 50 MG capsule Take 1 capsule (50 mg total) by mouth 3 (three) times daily with meals. 06/29/13  Yes Eleanore E Egan, PA-C  insulin glargine (LANTUS) 100 UNIT/ML injection Inject 60 Units into the skin 2 (two) times daily.    Yes Historical Provider, MD  Insulin Glulisine (APIDRA SOLOSTAR) 100 UNIT/ML Solostar Pen Inject 15 Units into the skin. 06/19/13  Yes Historical Provider, MD  levofloxacin (LEVAQUIN) 500 MG tablet Take 1 tablet (500 mg total) by mouth daily. 10/22/13  Yes [name], MD  levothyroxine (SYNTHROID, LEVOTHROID) 50 MCG tablet Take 1 tablet (50 mcg total) by mouth daily. 05/02/13  Yes [name], MD  lisinopril (PRINIVIL,ZESTRIL) 10 MG tablet TAKE 1 TABLET (10 MG TOTAL) BY MOUTH DAILY. 09/29/13  Yes [name], MD  metFORMIN (GLUCOPHAGE) 1000 MG tablet Take 1,000 mg by mouth 2 (two) times daily with a meal.  06/02/13 06/02/14 Yes Historical Provider, MD  torsemide (DEMADEX) 20  MG tablet Take 20 mg by mouth daily.   Yes Historical Provider, MD   History   Social History  . Marital Status: Single    Spouse Name: N/A    Number of Children: N/A  . Years of Education: N/A   Occupational History  . Not on file.   Social History Main Topics  . Smoking status: Never Smoker   . Smokeless tobacco: Not on file  . Alcohol Use: No  . Drug Use: No  . Sexual Activity: Yes   Other Topics Concern  . Not on file   Social History Narrative  . No narrative on file   Review of Systems  Constitutional: Positive for fever.    Respiratory: Positive for cough.   Psychiatric/Behavioral: Positive for sleep disturbance.      Objective:   Physical Exam  Nursing note and vitals reviewed. Constitutional: He is oriented to person, place, and time. He appears well-developed and well-nourished.  HENT:  Head: Normocephalic and atraumatic.  Mild erythema in the posterior pharynx.  Eyes: EOM are normal.  Neck: Normal range of motion.  Cardiovascular: Normal rate.   Pulmonary/Chest: Effort normal.  Expiratory wheezes.  Musculoskeletal: Normal range of motion.  Neurological: He is alert and oriented to person, place, and time.  Skin: Skin is warm and dry.  Psychiatric: He has a normal mood and affect. His behavior is normal.   Results for orders placed in visit on 10/26/13  POCT CBC      Result Value Ref Range   WBC 5.8  4.6 - 10.2 K/uL   Lymph, poc 1.5  0.6 - 3.4   POC LYMPH PERCENT 25.6  10 - 50 %L   MID (cbc) 0.4  0 - 0.9   POC MID % 6.9  0 - 12 %M   POC Granulocyte 3.9  2 - 6.9   Granulocyte percent 67.5  37 - 80 %G   RBC 5.41  4.69 - 6.13 M/uL   Hemoglobin 15.2  14.1 - 18.1 g/dL   HCT, POC 46.5  43.5 - 53.7 %   MCV 86.0  80 - 97 fL   MCH, POC 28.1  27 - 31.2 pg   MCHC 32.7  31.8 - 35.4 g/dL   RDW, POC 13.4     Platelet Count, POC 242  142 - 424 K/uL   MPV 7.8  0 - 99.8 fL  POCT INFLUENZA A/B      Result Value Ref Range   Influenza A, POC Negative     Influenza B, POC Negative      [facility] reading (PRIMARY) by  Dr. Joseph Art:  CXR shows no significant change.  .    BP 146/94  Pulse 105  Temp(Src) 98.2 F (36.8 C)  Resp 17  Ht 6' (1.829 m)  Wt 261 lb (118.389 kg)  BMI 35.39 kg/m2  SpO2 96%      Assessment & Plan:  I personally performed the services described in this documentation, which was scribed in my presence. The recorded information has been reviewed and is accurate.  Cough - Plan: DG Chest 2 View, POCT CBC, POCT Influenza A/B, levofloxacin (LEVAQUIN) 500 MG tablet  Fever,  unspecified fever cause - Plan: DG Chest 2 View, POCT CBC, POCT Influenza A/B  Wheezing - Plan: DG Chest 2 View, POCT CBC, POCT Influenza A/B, levofloxacin (LEVAQUIN) 500 MG tablet  SOB (shortness of breath) - Plan: levofloxacin (LEVAQUIN) 500 MG tablet  CAP (community acquired pneumonia) - Plan: levofloxacin (  LEVAQUIN) 500 MG tablet  Use the inhaler every 4 hours, 1 puff at a time Stair work next 2 days and rest Signed, [name], MD

## 2013-12-22 ENCOUNTER — Encounter: Payer: Self-pay | Admitting: *Deleted

## 2013-12-28 ENCOUNTER — Ambulatory Visit (INDEPENDENT_AMBULATORY_CARE_PROVIDER_SITE_OTHER): Payer: Managed Care, Other (non HMO) | Admitting: Family Medicine

## 2013-12-28 ENCOUNTER — Ambulatory Visit (INDEPENDENT_AMBULATORY_CARE_PROVIDER_SITE_OTHER): Payer: Managed Care, Other (non HMO)

## 2013-12-28 VITALS — BP 162/100 | HR 106 | Temp 98.6°F | Resp 16

## 2013-12-28 DIAGNOSIS — M542 Cervicalgia: Secondary | ICD-10-CM

## 2013-12-28 DIAGNOSIS — J988 Other specified respiratory disorders: Secondary | ICD-10-CM

## 2013-12-28 DIAGNOSIS — R062 Wheezing: Secondary | ICD-10-CM

## 2013-12-28 DIAGNOSIS — I10 Essential (primary) hypertension: Secondary | ICD-10-CM

## 2013-12-28 DIAGNOSIS — E039 Hypothyroidism, unspecified: Secondary | ICD-10-CM

## 2013-12-28 LAB — POCT CBC
Granulocyte percent: 64 % (ref 37–80)
HCT, POC: 44.6 % (ref 43.5–53.7)
Hemoglobin: 14.9 g/dL (ref 14.1–18.1)
Lymph, poc: 1.7 (ref 0.6–3.4)
MCH, POC: 29 pg (ref 27–31.2)
MCHC: 33.3 g/dL (ref 31.8–35.4)
MCV: 87 fL (ref 80–97)
MID (cbc): 0.5 (ref 0–0.9)
MPV: 7.5 fL (ref 0–99.8)
POC Granulocyte: 3.9 (ref 2–6.9)
POC LYMPH PERCENT: 28.2 %L (ref 10–50)
POC MID %: 7.8 % (ref 0–12)
Platelet Count, POC: 249 10*3/uL (ref 142–424)
RBC: 5.13 M/uL (ref 4.69–6.13)
RDW, POC: 12.7 %
WBC: 6.1 10*3/uL (ref 4.6–10.2)

## 2013-12-28 LAB — COMPLETE METABOLIC PANEL WITHOUT GFR
ALT: 25 U/L (ref 0–53)
Alkaline Phosphatase: 78 U/L (ref 39–117)
CO2: 25 meq/L (ref 19–32)
Calcium: 8.7 mg/dL (ref 8.4–10.5)
Sodium: 138 meq/L (ref 135–145)
Total Bilirubin: 0.5 mg/dL (ref 0.2–1.2)

## 2013-12-28 LAB — COMPLETE METABOLIC PANEL WITH GFR
AST: 24 U/L (ref 0–37)
Albumin: 3.2 g/dL — ABNORMAL LOW (ref 3.5–5.2)
BUN: 18 mg/dL (ref 6–23)
Chloride: 106 mEq/L (ref 96–112)
Creat: 0.9 mg/dL (ref 0.50–1.35)
GFR, Est African American: >89 mL/min
GFR, Est Non African American: >89 mL/min
Glucose, Bld: 143 mg/dL — ABNORMAL HIGH (ref 70–99)
Potassium: 4.2 mEq/L (ref 3.5–5.3)
Total Protein: 5.8 g/dL — ABNORMAL LOW (ref 6.0–8.3)

## 2013-12-28 LAB — TSH: TSH: 5.22 u[IU]/mL — ABNORMAL HIGH (ref 0.350–4.500)

## 2013-12-28 MED ORDER — HYDROCODONE-ACETAMINOPHEN 5-325 MG PO TABS: 1.0000 | ORAL_TABLET | Freq: Four times a day (QID) | ORAL | Status: DC | PRN

## 2013-12-28 MED ORDER — FUROSEMIDE 20 MG PO TABS
20.0000 mg | ORAL_TABLET | Freq: Every day | ORAL | Status: DC
Start: 2013-12-28 — End: 2014-01-27

## 2013-12-28 MED ORDER — LISINOPRIL 20 MG PO TABS: 20.0000 mg | ORAL_TABLET | Freq: Every day | ORAL | Status: DC

## 2013-12-28 MED ORDER — ALBUTEROL SULFATE (2.5 MG/3ML) 0.083% IN NEBU
2.5000 mg | INHALATION_SOLUTION | Freq: Once | RESPIRATORY_TRACT | Status: AC
  Administered 2013-12-28: 2.5 mg via RESPIRATORY_TRACT

## 2013-12-28 MED ORDER — IPRATROPIUM BROMIDE 0.02 % IN SOLN
0.5000 mg | Freq: Once | RESPIRATORY_TRACT | Status: AC
  Administered 2013-12-28: 0.5 mg via RESPIRATORY_TRACT

## 2013-12-28 MED ORDER — CYCLOBENZAPRINE HCL 10 MG PO TABS: 10.0000 mg | ORAL_TABLET | Freq: Three times a day (TID) | ORAL | Status: DC | PRN

## 2013-12-28 NOTE — Progress Notes (Addendum)
ePatient ID: Russell [name], male   DOB: [birth year], 43 y.o.   MRN: [id number]    This chart was scribed for Rikki Spearing, DO by [facility], ED Scribe at [facility]. The patient was seen in exam room 08 and the patient's care was started at 9:48 AM.  Chief Complaint:  Chief Complaint  Patient presents with   Back Pain    x 3 days    Neck Pain   HPI: Russell [name] is a 43 y.o. male who is here for neck pain and neck stiffness, onset 3 days ago. The pain radiates to his head and back. He has chills, diaphoresis, trouble sleeping and HA as associated symptoms. Pt has pain with turning his head in both directions and lifting his hands up. Pt states he was lifting heavy boxes at home and believes this may have caused his injury. Pt has taken motrin 893m BID, hot and cold compresses for minor relief. He denies nausea, vomiting, numbness, tingling, photophobia, CP and diarrhea. He has never injured his neck or back.  Pt had pneumonia 2 weeks ago and he was treated but still has wheezing at night Dr. LGrayce Sessionsis his endocrinologist who watches his DM and hyperthyroidism  He has not been complaint with his lisinopril for BP. He does not usually check his BP at home. He has not taken his lasix He is complaint with his metformin, he checked his blood sugar today and it was 170.  Past Medical History  Diagnosis Date   Thyroid disease    Diabetes mellitus without complication    Hypertension    Past Surgical History  Procedure Laterality Date   Right ankle fx repair     Eye surgery     History   Social History   Marital Status: Single    Spouse Name: N/A    Number of Children: N/A   Years of Education: N/A   Social History Main Topics   Smoking status: Never Smoker    Smokeless tobacco: None   Alcohol Use: No   Drug Use: No   Sexual Activity: Yes   Other Topics Concern   None   Social History Narrative   History reviewed. No pertinent family  history. No Known Allergies Prior to Admission medications   Medication Sig Start Date End Date Taking? Authorizing Provider  albuterol (PROVENTIL HFA;VENTOLIN HFA) 108 (90 BASE) MCG/ACT inhaler Inhale 2 puffs into the lungs every 6 (six) hours as needed for wheezing or shortness of breath. 10/22/13  Yes KRobyn Haber MD  ALPRAZolam (Duanne Moron 0.5 MG tablet TAKE 1 TABLET TWICE DAILY AS NEEDED FOR SLEEP 09/08/13  Yes KRobyn Haber MD  Blood Glucose Monitoring Suppl (ONETOUCH VERIO IQ SYSTEM) W/DEVICE KIT 1 each by Does not apply route. 06/02/13  Yes Historical Provider, MD  furosemide (LASIX) 40 MG tablet Take 40 mg by mouth daily.   Yes Historical Provider, MD  gabapentin (NEURONTIN) 600 MG tablet Take 600 mg by mouth 4 (four) times daily.  12/19/11  Yes KRobyn Haber MD  glucose blood [facility] test strip 7-8 times per day 06/02/13 06/02/14 Yes Historical Provider, MD  HYDROcodone-acetaminophen (NORCO) 5-325 MG per tablet Take 1 tablet by mouth every 6 (six) hours as needed. 06/29/13  Yes Eleanore E EElana Alm PA-C  ibuprofen (ADVIL,MOTRIN) 800 MG tablet Take 1 tablet (800 mg total) by mouth every 8 (eight) hours as needed. 01/28/13  Yes KRobyn Haber MD  indomethacin (INDOCIN) 50 MG capsule  Take 1 capsule (50 mg total) by mouth 3 (three) times daily with meals. 06/29/13  Yes Eleanore E Egan, PA-C  insulin glargine (LANTUS) 100 UNIT/ML injection Inject 60 Units into the skin 2 (two) times daily.    Yes Historical Provider, MD  Insulin Glulisine (APIDRA SOLOSTAR) 100 UNIT/ML Solostar Pen Inject 15 Units into the skin. 06/19/13  Yes Historical Provider, MD  levofloxacin (LEVAQUIN) 500 MG tablet Take 1 tablet (500 mg total) by mouth 2 (two) times daily. 10/26/13  Yes [name], MD  levothyroxine (SYNTHROID, LEVOTHROID) 50 MCG tablet Take 1 tablet (50 mcg total) by mouth daily. 05/02/13  Yes [name], MD  lisinopril (PRINIVIL,ZESTRIL) 10 MG tablet TAKE 1 TABLET (10 MG TOTAL) BY MOUTH DAILY.  09/29/13  Yes [name], MD  metFORMIN (GLUCOPHAGE) 1000 MG tablet Take 1,000 mg by mouth 2 (two) times daily with a meal.  06/02/13 06/02/14 Yes Historical Provider, MD  torsemide (DEMADEX) 20 MG tablet Take 20 mg by mouth daily.   Yes Historical Provider, MD     ROS: The patient denies fevers,unintentional weight loss, chest pain, palpitations, dyspnea on exertion, nausea, vomiting, abdominal pain, dysuria, hematuria, melena, numbness, weakness, or tingling.   All other systems have been reviewed and were otherwise negative with the exception of those mentioned in the HPI and as above.    PHYSICAL EXAM: Filed Vitals:   12/28/13 0926  BP: 162/100  Pulse: 106  Temp: 98.6 F (37 C)  Resp: 16   SpO2- 98%  There is no weight on file to calculate BMI.  General: Alert, no acute distress HEENT:  Normocephalic, atraumatic, oropharynx patent. EOMI, PERRLA Cardiovascular:  Regular rate and rhythm, no rubs murmurs or gallops.  No Carotid bruits, radial pulse intact. No pedal edema.  Respiratory: Clear to auscultation bilaterally.  No wheezes, rales, or rhonchi.  No cyanosis, no use of accessory musculature GI: No organomegaly, abdomen is soft and non-tender, positive bowel sounds.  No masses. Skin: No rashes. Neurologic: Facial musculature symmetric, 5/5 strength upper and lower extremity bilaterally . Psychiatric: Patient is appropriate throughout our interaction. Lymphatic: No cervical lymphadenopathy Musculoskeletal: Gait intact, +1 pitting edema bilaterally,  decreased ROM in cervical spine Good grip strength, UE 5/5 strength   LABS: Results for orders placed or performed in visit on 12/28/13  POCT CBC  Result Value Ref Range   WBC 6.1 4.6 - 10.2 K/uL   Lymph, poc 1.7 0.6 - 3.4   POC LYMPH PERCENT 28.2 10 - 50 %L   MID (cbc) 0.5 0 - 0.9   POC MID % 7.8 0 - 12 %M   POC Granulocyte 3.9 2 - 6.9   Granulocyte percent 64.0 37 - 80 %G   RBC 5.13 4.69 - 6.13 M/uL   Hemoglobin  14.9 14.1 - 18.1 g/dL   HCT, POC 44.6 43.5 - 53.7 %   MCV 87.0 80 - 97 fL   MCH, POC 29.0 27 - 31.2 pg   MCHC 33.3 31.8 - 35.4 g/dL   RDW, POC 12.7 %   Platelet Count, POC 249 142 - 424 K/uL   MPV 7.5 0 - 99.8 fL     EKG/XRAY:   Primary read interpreted by [facility]. Marin Comment at [facility]. Neg for fx or dislocation, + anterior osteophyte chronic in appearance NEg for infiltrate or effusion    ASSESSMENT/PLAN: Neck pain -spasm/sprain and strain,  Rx Norco, flexeril, and mobic for neck sprain.strain.  HTN-not well controlled noncompliant , willincrease lisinopril 10 mg daily  to 20 mg daily for better blood pressure control Pedal edema-he ran out LAsix which he was taking regular but ran out and pedal edema has returned, this worked for him, will decrease Lasix from 40 mg to 20 mg since he has trace to +1 pitting edema Diabetes-well controlled last HbA1c was 7.1, sugar today was in the 170s Neb treatmente x 1 and felt much better, Cont with albuterol inhaler at home F/u with dr Joseph Art in 1 month for repeat labs since I have adjusted his BP meds.  He may benefit from a medrol dose pack or some steroids both from a pain and radicular pain stand point and with his RAD if he does not get better with the treatments above, he will call us Work note given     Gross sideeffects, risk and benefits, and alternatives of medications d/w patient. Patient is aware that all medications have potential sideeffects and we are unable to predict every sideeffect or drug-drug interaction that may occur.  Rikki Spearing, DO 12/28/2013 11:02 AM  I personally performed the services described in this documentation which is scribed in my presence and has been reviewed and is accurate. Dr Marin Comment

## 2013-12-29 ENCOUNTER — Telehealth: Payer: Self-pay

## 2013-12-29 NOTE — Telephone Encounter (Signed)
Pt called concerning his visit with Dr. Conley RollsLe on 12/27. He wants his steroid pack sent to his pharmacy- [facility] on [address]. I could not find what drug he was talking about.Gg. Please advise at [phone]

## 2013-12-30 ENCOUNTER — Other Ambulatory Visit: Payer: Self-pay | Admitting: Family Medicine

## 2013-12-30 ENCOUNTER — Encounter: Payer: Self-pay | Admitting: Family Medicine

## 2013-12-30 MED ORDER — METHYLPREDNISOLONE (PAK) 4 MG PO TABS: ORAL_TABLET | ORAL | Status: DC

## 2013-12-30 NOTE — Telephone Encounter (Signed)
Spoke with patient, needs longer steroid taper so will try 2 medrol dose pack , 6-6-5-5-4-4-3-3-2-2-1-1 tabs, letter written and left up front for pick up

## 2013-12-30 NOTE — Telephone Encounter (Signed)
Pt states it is the MEDROL-PAK that is suppose to be called in. Please call pt at [phone]      [facility] [address]

## 2013-12-30 NOTE — Progress Notes (Unsigned)
Pt advised.

## 2013-12-30 NOTE — Telephone Encounter (Signed)
Please advise if Medrol Barbee Coughak is suppose to be sent to his pharmacy.

## 2014-01-27 ENCOUNTER — Ambulatory Visit (INDEPENDENT_AMBULATORY_CARE_PROVIDER_SITE_OTHER): Payer: Managed Care, Other (non HMO) | Admitting: Family Medicine

## 2014-01-27 VITALS — BP 148/102 | HR 114 | Temp 98.8°F | Resp 20 | Ht 73.0 in | Wt 259.2 lb

## 2014-01-27 DIAGNOSIS — J45901 Unspecified asthma with (acute) exacerbation: Secondary | ICD-10-CM

## 2014-01-27 DIAGNOSIS — L0390 Cellulitis, unspecified: Secondary | ICD-10-CM

## 2014-01-27 DIAGNOSIS — R600 Localized edema: Secondary | ICD-10-CM

## 2014-01-27 MED ORDER — FUROSEMIDE 20 MG PO TABS: 20.0000 mg | ORAL_TABLET | Freq: Every day | ORAL | Status: DC

## 2014-01-27 MED ORDER — DOXYCYCLINE HYCLATE 100 MG PO TABS: 100.0000 mg | ORAL_TABLET | Freq: Two times a day (BID) | ORAL | Status: DC

## 2014-01-27 MED ORDER — MOMETASONE FUROATE 220 MCG/INH IN AEPB: 2.0000 | INHALATION_SPRAY | Freq: Every day | RESPIRATORY_TRACT | Status: DC

## 2014-01-27 NOTE — Progress Notes (Signed)
This chart was scribed for Dr. [name], MD by Erling Conte, Medical Scribe. This patient was seen in Room 3 and the patient's care was started at 4:42 PM.   Patient ID: Russell [name] MRN: [id number], DOB: [birth year], 44 y.o. Date of Encounter: 01/27/2014, 4:54 PM  Primary Physician: [name], MD  Chief Complaint:  Chief Complaint  Patient presents with  . Cellulitis    in both legs. Pt. is unsure of how long its been. Very painful.      HPI: 44 y.o. year old male with history below presents with possible cellulitis in his bilateral legs. He is complaining of pain and swelling in his bilateral calves and feet, fever, chills. Pt states his legs are painful to the touch Pt is unsure how long it has been going on. Pt has to wear steel toed boots at work. Pt states he is unable to get his boots off at the end of the day due to the swelling until he has elevated his feet for 1-2 hours. He denies any sensation loss.   Past Medical History  Diagnosis Date  . Thyroid disease   . Diabetes mellitus without complication   . Hypertension      Home Meds: Prior to Admission medications   Medication Sig Start Date End Date Taking? Authorizing Provider  albuterol (PROVENTIL HFA;VENTOLIN HFA) 108 (90 BASE) MCG/ACT inhaler Inhale 2 puffs into the lungs every 6 (six) hours as needed for wheezing or shortness of breath. 10/22/13  Yes [name], MD  ALPRAZolam Duanne Moron) 0.5 MG tablet TAKE 1 TABLET TWICE DAILY AS NEEDED FOR SLEEP 09/08/13  Yes [name], MD  Blood Glucose Monitoring Suppl (ONETOUCH VERIO IQ SYSTEM) W/DEVICE KIT 1 each by Does not apply route. 06/02/13  Yes Historical Provider, MD  cyclobenzaprine (FLEXERIL) 10 MG tablet Take 1 tablet (10 mg total) by mouth 3 (three) times daily as needed for muscle spasms. 12/28/13  Yes Thao P Le, DO  furosemide (LASIX) 20 MG tablet Take 1 tablet (20 mg total) by mouth daily. 12/28/13  Yes Thao P Le, DO  gabapentin (NEURONTIN)  600 MG tablet Take 600 mg by mouth 4 (four) times daily.  12/19/11  Yes [name], MD  glucose blood [facility] VERIO) test strip 7-8 times per day 06/02/13 06/02/14 Yes Historical Provider, MD  HYDROcodone-acetaminophen (NORCO) 5-325 MG per tablet Take 1 tablet by mouth every 6 (six) hours as needed. 12/28/13  Yes Thao P Le, DO  ibuprofen (ADVIL,MOTRIN) 800 MG tablet Take 1 tablet (800 mg total) by mouth every 8 (eight) hours as needed. 01/28/13  Yes [name], MD  indomethacin (INDOCIN) 50 MG capsule Take 1 capsule (50 mg total) by mouth 3 (three) times daily with meals. 06/29/13  Yes Eleanore E Egan, PA-C  insulin glargine (LANTUS) 100 UNIT/ML injection Inject 60 Units into the skin 2 (two) times daily.    Yes Historical Provider, MD  Insulin Glulisine (APIDRA SOLOSTAR) 100 UNIT/ML Solostar Pen Inject 15 Units into the skin. 06/19/13  Yes Historical Provider, MD  levothyroxine (SYNTHROID, LEVOTHROID) 50 MCG tablet Take 1 tablet (50 mcg total) by mouth daily. 05/02/13  Yes [name], MD  lisinopril (PRINIVIL,ZESTRIL) 20 MG tablet Take 1 tablet (20 mg total) by mouth daily. 12/28/13  Yes Thao P Le, DO  metFORMIN (GLUCOPHAGE) 1000 MG tablet Take 1,000 mg by mouth 2 (two) times daily with a meal.  06/02/13 06/02/14 Yes Historical Provider, MD  methylPREDNIsolone (MEDROL DOSPACK) 4 MG tablet follow package  directions. Patient is to get 2 packs so that it will be a longer taper. 12/30/13  Yes Thao P Le, DO    Allergies: No Known Allergies  History   Social History  . Marital Status: Single    Spouse Name: N/A    Number of Children: N/A  . Years of Education: N/A   Occupational History  . Not on file.   Social History Main Topics  . Smoking status: Never Smoker   . Smokeless tobacco: Not on file  . Alcohol Use: No  . Drug Use: No  . Sexual Activity: Yes   Other Topics Concern  . Not on file   Social History Narrative     Review of Systems: Constitutional: negative for  chills, fever, night sweats, weight changes, or fatigue  HEENT: negative for vision changes, hearing loss, congestion, rhinorrhea, ST, epistaxis, or sinus pressure Cardiovascular: negative for chest pain or palpitations Respiratory: negative for hemoptysis, wheezing, shortness of breath, or cough Abdominal: negative for abdominal pain, nausea, vomiting, diarrhea, or constipation Dermatological: positive for redness in bilateral calves.  Musk: positive for swelling in bilateral calves Neurologic: negative for headache, dizziness, or syncope All other systems reviewed and are otherwise negative with the exception to those above and in the HPI.   Physical Exam: Blood pressure 148/102, pulse 114, temperature 98.8 F (37.1 C), temperature source Oral, resp. rate 20, height 6' 1"  (1.854 m), weight 259 lb 3.2 oz (117.572 kg), SpO2 98 %., Body mass index is 34.2 kg/(m^2). General: Well developed, well nourished, in no acute distress. Head: Normocephalic, atraumatic, eyes without discharge, sclera non-icteric, nares are without discharge. Bilateral auditory canals clear, TM's are without perforation, pearly grey and translucent with reflective cone of light bilaterally. Oral cavity moist, posterior pharynx without exudate, erythema, peritonsillar abscess, or post nasal drip.  Neck: Supple. No thyromegaly. Full ROM. No lymphadenopathy. Lungs: Clear bilaterally to auscultation without wheezes, rales, or rhonchi. Breathing is unlabored. Heart: RRR with S1 S2. No murmurs, rubs, or gallops appreciated. Abdomen: Soft, non-tender, non-distended with normoactive bowel sounds. No hepatomegaly. No rebound/guarding. No obvious abdominal masses. Msk:  Strength and tone normal for age. Extremities/Skin: Warm and dry. No clubbing or cyanosis. No edema. Diffuse petechial rash on both calves and anterior shins with tenderness throughout lower extremities. White exudate on right foot between 3rd and 4th and 4th and 5th  toes and webbed spaces with white scaly plaque.  Neuro: Alert and oriented X 3. Moves all extremities spontaneously. Gait is normal. CNII-XII grossly in tact. Psych:  Responds to questions appropriately with a normal affect.   Labs:   ASSESSMENT AND PLAN:  44 y.o. year old male with cellulitis of .   ICD-9-CM ICD-10-CM   1. Pedal edema 782.3 R60.0   2. Cellulitis, unspecified cellulitis site, unspecified extremity site, unspecified laterality 682.9 L03.90   3. Asthma with acute exacerbation, unspecified asthma severity 493.92 J45.901      Meds ordered this encounter  Medications  . furosemide (LASIX) 20 MG tablet    Sig: Take 1 tablet (20 mg total) by mouth daily.    Dispense:  30 tablet    Refill:  3  . doxycycline (VIBRA-TABS) 100 MG tablet    Sig: Take 1 tablet (100 mg total) by mouth 2 (two) times daily.    Dispense:  20 tablet    Refill:  0  . mometasone (ASMANEX 60 METERED DOSES) 220 MCG/INH inhaler    Sig: Inhale 2 puffs into the lungs daily.  Dispense:  1 Inhaler    Refill:  12     Will give pt work note to return to work on February 1st, 2016. Advised pt to return to the office on Sunday, February 31st, to evaluate if he is ready to return to work. Advised to keep legs elevated.  This chart was scribed in my presence and reviewed by me personally.    ICD-9-CM ICD-10-CM   1. Pedal edema 782.3 R60.0 furosemide (LASIX) 20 MG tablet  2. Cellulitis, unspecified cellulitis site, unspecified extremity site, unspecified laterality 682.9 L03.90 doxycycline (VIBRA-TABS) 100 MG tablet  3. Asthma with acute exacerbation, unspecified asthma severity 493.92 J45.901 mometasone (ASMANEX 60 METERED DOSES) 220 MCG/INH inhaler     Signed, [name], MD   Signed, [name], MD 01/27/2014 4:54 PM

## 2014-01-27 NOTE — Patient Instructions (Signed)
Please return on Sunday so we can see if you're rated go back to work. In the meantime keep her legs elevated, restart the Lasix, take the Asmanex 2 puffs daily, and stay on the albuterol.  I've written for doxycycline for the cellulitis. Make sure you take the antifungal cream for the third and fourth toes on the right

## 2014-02-01 ENCOUNTER — Ambulatory Visit (HOSPITAL_BASED_OUTPATIENT_CLINIC_OR_DEPARTMENT_OTHER)
Admission: RE | Admit: 2014-02-01 | Discharge: 2014-02-01 | Disposition: A | Discharge status: Home / Self Care | Payer: Managed Care, Other (non HMO) | Source: Ambulatory Visit | Attending: Family Medicine | Admitting: Family Medicine

## 2014-02-01 ENCOUNTER — Telehealth: Payer: Self-pay | Admitting: Family Medicine

## 2014-02-01 ENCOUNTER — Ambulatory Visit (INDEPENDENT_AMBULATORY_CARE_PROVIDER_SITE_OTHER): Payer: Managed Care, Other (non HMO) | Admitting: Family Medicine

## 2014-02-01 VITALS — BP 119/82 | HR 119 | Temp 98.4°F | Resp 18 | Wt 258.0 lb

## 2014-02-01 DIAGNOSIS — E118 Type 2 diabetes mellitus with unspecified complications: Secondary | ICD-10-CM

## 2014-02-01 DIAGNOSIS — I809 Phlebitis and thrombophlebitis of unspecified site: Secondary | ICD-10-CM

## 2014-02-01 DIAGNOSIS — R2241 Localized swelling, mass and lump, right lower limb: Secondary | ICD-10-CM | POA: Diagnosis not present

## 2014-02-01 DIAGNOSIS — R600 Localized edema: Secondary | ICD-10-CM

## 2014-02-01 DIAGNOSIS — M79605 Pain in left leg: Secondary | ICD-10-CM

## 2014-02-01 DIAGNOSIS — J45909 Unspecified asthma, uncomplicated: Secondary | ICD-10-CM

## 2014-02-01 DIAGNOSIS — I8391 Asymptomatic varicose veins of right lower extremity: Secondary | ICD-10-CM | POA: Diagnosis not present

## 2014-02-01 DIAGNOSIS — M7989 Other specified soft tissue disorders: Secondary | ICD-10-CM | POA: Insufficient documentation

## 2014-02-01 DIAGNOSIS — E291 Testicular hypofunction: Secondary | ICD-10-CM

## 2014-02-01 DIAGNOSIS — M79604 Pain in right leg: Secondary | ICD-10-CM

## 2014-02-01 DIAGNOSIS — B360 Pityriasis versicolor: Secondary | ICD-10-CM

## 2014-02-01 DIAGNOSIS — R21 Rash and other nonspecific skin eruption: Secondary | ICD-10-CM

## 2014-02-01 MED ORDER — KETOCONAZOLE 2 % EX CREA: 1.0000 "application " | TOPICAL_CREAM | Freq: Every day | CUTANEOUS | Status: DC

## 2014-02-01 MED ORDER — TESTOSTERONE 20.25 MG/1.25GM (1.62%) TD GEL: 1.0000 | Freq: Every day | TRANSDERMAL | Status: DC

## 2014-02-01 MED ORDER — LIRAGLUTIDE 18 MG/3ML ~~LOC~~ SOPN: PEN_INJECTOR | SUBCUTANEOUS | Status: DC

## 2014-02-01 MED ORDER — NAPROXEN 500 MG PO TABS: 500.0000 mg | ORAL_TABLET | Freq: Two times a day (BID) | ORAL | Status: DC

## 2014-02-01 NOTE — Progress Notes (Signed)
° °  Subjective:    Patient ID: Russell [name], male    DOB: 07-24-1970, 44 y.o.   MRN: [id number] This chart was scribed for [name], MD by Littie Deedsichard Sun, Medical Scribe. This patient was seen in Room 13 and the patient's care was started at 10:45 AM.   HPI HPI Comments: Russell BirkenheadSteven B Arens is a 44 y.o. male with a hx of varicose veins, DM, and HLD who presents to the [facility] for a follow-up for cellulitis in both legs. Patient still has pain/sensitivity in both legs, worse in right leg. The pain is worsened when standing. His spouse notes that he has not been eating as much recently. He states he walks over 2 miles at night. He has not been working as instructed.  Patient finished his course of prednisone 2 weeks ago. His cough has improved, but he still has some wheezing.  Patient states his testosterone levels are low.  My note from 01/27/14: 44 y.o. year old male with history below presents with possible cellulitis in his bilateral legs. He is complaining of pain and swelling in his bilateral calves and feet, fever, chills. Pt states his legs are painful to the touch Pt is unsure how long it has been going on. Pt has to wear steel toed boots at work. Pt states he is unable to get his boots off at the end of the day due to the swelling until he has elevated his feet for 1-2 hours. He denies any sensation loss.   Review of Systems  Respiratory: Positive for cough and wheezing.   Cardiovascular: Positive for leg swelling.  Skin: Positive for pallor.       Objective:   Physical Exam CONSTITUTIONAL: Well developed/well nourished HEAD: Normocephalic/atraumatic EYES: EOM/PERRL ENMT: Mucous membranes moist NECK: supple no meningeal signs SPINE: entire spine nontender CV: S1/S2 noted, no murmurs/rubs/gallops noted LUNGS: Lungs are clear to auscultation bilaterally, no apparent distress ABDOMEN: soft, nontender, no rebound or guarding GU: no cva tenderness NEURO:  Pt is awake/alert, moves all extremitiesx4 EXTREMITIES: pulses normal, full ROM, with erythema medial left thigh and tenderness entire length of both greater saphenous veins SKIN: warm, mildly reddened lower legs just above the ankles PSYCH: no abnormalities of mood noted  Feet are very tender to palpation      Assessment & Plan:   This chart was scribed in my presence and reviewed by me personally.    ICD-9-CM ICD-10-CM   1. Hypogonadism in male 257.2 E29.1 Testosterone (ANDROGEL) 20.25 MG/1.25GM (1.62%) GEL  2. Tinea versicolor 111.0 B36.0 ketoconazole (NIZORAL) 2 % cream  3. Bilateral leg pain 729.5 M79.604     M79.605   4. Bilateral leg edema 782.3 R60.0    Patient has uncontrolled diabetes secondary to recent prednisone use. Is having trouble to indicating with his endocrinologist and wants to discontinue visits there. His urologist says that he needs testosterone but his endocrinologist as this would be bad for him  Patient case have significant pain in his feet with swelling although the swelling has gone down some as has the redness. It's concerning that he has pain along the greater saphenous regions of his legs and he does have the new rash in the thigh region suggestive of tinea infection.  He will continue to take 60 units of Lantus at night and adjustable Humalog according to his carbohydrate intake.  Recheck Wednesday  Signed, [name], MD

## 2014-02-01 NOTE — Telephone Encounter (Signed)
[facility] called with U/S Bilateral Lower Extremity Venous Doppler. See report. Dr. Milus GlazierLauenstein reviewed results and discussed with patient while he was still there. Patient voiced understanding. Per Dr. Milus GlazierLauenstein send in Naprosyn 500 mg 1 po bid # 14 [facility]. RX sent in

## 2014-02-01 NOTE — Patient Instructions (Addendum)
[facility] [place] [facility] [address] [place] ph# [phone] for outpatient Venous Duplex. Arrive at 12:15 pm to register. Register at the Emergency Department. DO NOT register as ED patient.   concerned with the pain in your legs being clot related so will get the ultrasound today.  I would like to see how you are doing in another 3 days.  I've called in the testosterone cream as well as the cream for the rash on her legs. It's continued to use the same dose of insulin that your doing now with the hope and expectation that as the prednisone gets out of your system, the blood sugar will respond. A contact your endocrinologist in the next day or so she's come up with a better plan

## 2014-02-04 ENCOUNTER — Ambulatory Visit (INDEPENDENT_AMBULATORY_CARE_PROVIDER_SITE_OTHER): Payer: Managed Care, Other (non HMO) | Admitting: Family Medicine

## 2014-02-04 VITALS — BP 166/108 | HR 116 | Temp 98.3°F | Resp 17 | Ht 72.0 in | Wt 256.0 lb

## 2014-02-04 DIAGNOSIS — I809 Phlebitis and thrombophlebitis of unspecified site: Secondary | ICD-10-CM

## 2014-02-04 DIAGNOSIS — E0842 Diabetes mellitus due to underlying condition with diabetic polyneuropathy: Secondary | ICD-10-CM

## 2014-02-04 MED ORDER — RIVAROXABAN (XARELTO) VTE STARTER PACK (15 & 20 MG): ORAL_TABLET | ORAL | Status: DC

## 2014-02-04 NOTE — Patient Instructions (Signed)
Try capsaicin cream (OTC) for the skin burning    Diabetic Neuropathy Diabetic neuropathy is a nerve disease or nerve damage that is caused by diabetes mellitus. About half of all people with diabetes mellitus have some form of nerve damage. Nerve damage is more common in those who have had diabetes mellitus for many years and who generally have not had good control of their blood sugar (glucose) level. Diabetic neuropathy is a common complication of diabetes mellitus. There are three more common types of diabetic neuropathy and a fourth type that is less common and less understood:   Peripheral neuropathy--This is the most common type of diabetic neuropathy. It causes damage to the nerves of the feet and legs first and then eventually the hands and arms.The damage affects the ability to sense touch.  Autonomic neuropathy--This type causes damage to the autonomic nervous system, which controls the following functions:  Heartbeat.  Body temperature.  Blood pressure.  Urination.  Digestion.  Sweating.  Sexual function.  Focal neuropathy--Focal neuropathy can be painful and unpredictable and occurs most often in older adults with diabetes mellitus. It involves a specific nerve or one area and often comes on suddenly. It usually does not cause long-term problems.  Radiculoplexus neuropathy-- Sometimes called lumbosacral radiculoplexus neuropathy, radiculoplexus neuropathy affects the nerves of the thighs, hips, buttocks, or legs. It is more common in people with type 2 diabetes mellitus and in older men. It is characterized by debilitating pain, weakness, and atrophy, usually in the thigh muscles. CAUSES  The cause of peripheral, autonomic, and focal neuropathies is diabetes mellitus that is uncontrolled and high glucose levels. The cause of radiculoplexus neuropathy is unknown. However, it is thought to be caused by inflammation related to uncontrolled glucose levels. SIGNS AND SYMPTOMS   Peripheral Neuropathy Peripheral neuropathy develops slowly over time. When the nerves of the feet and legs no longer work there may be:   Burning, stabbing, or aching pain in the legs or feet.  Inability to feel pressure or pain in your feet. This can lead to:  Thick calluses over pressure areas.  Pressure sores.  Ulcers.  Foot deformities.  Reduced ability to feel temperature changes.  Muscle weakness. Autonomic Neuropathy The symptoms of autonomic neuropathy vary depending on which nerves are affected. Symptoms may include:  Problems with digestion, such as:  Feeling sick to your stomach (nausea).  Vomiting.  Bloating.  Constipation.  Diarrhea.  Abdominal pain.  Difficulty with urination. This occurs if you lose your ability to sense when your bladder is full. Problems include:  Urine leakage (incontinence).  Inability to empty your bladder completely (retention).  Rapid or irregular heartbeat (palpitations).  Blood pressure drops when you stand up (orthostatic hypotension). When you stand up you may feel:  Dizzy.  Weak.  Faint.  In men, inability to attain and maintain an erection.  In women, vaginal dryness and problems with decreased sexual desire and arousal.  Problems with body temperature regulation.  Increased or decreased sweating. Focal Neuropathy  Abnormal eye movements or abnormal alignment of both eyes.  Weakness in the wrist.  Foot drop. This results in an inability to lift the foot properly and abnormal walking or foot movement.  Paralysis on one side of your face (Bell palsy).  Chest or abdominal pain. Radiculoplexus Neuropathy  Sudden, severe pain in your hip, thigh, or buttocks.  Weakness and wasting of thigh muscles.  Difficulty rising from a seated position.  Abdominal swelling.  Unexplained weight loss (usually more than  10 lb [4.5 kg]). DIAGNOSIS  Peripheral Neuropathy Your senses may be tested. Sensory  function testing can be done with:  A light touch using a monofilament.  A vibration with tuning fork.  A sharp sensation with a pin prick. Other tests that can help diagnose neuropathy are:  Nerve conduction velocity. This test checks the transmission of an electrical current through a nerve.  Electromyography. This shows how muscles respond to electrical signals transmitted by nearby nerves.  Quantitative sensory testing. This is used to assess how your nerves respond to vibrations and changes in temperature. Autonomic Neuropathy Diagnosis is often based on reported symptoms. Tell your health care provider if you experience:   Dizziness.   Constipation.   Diarrhea.   Inappropriate urination or inability to urinate.   Inability to get or maintain an erection.  Tests that may be done include:   Electrocardiography or Holter monitor. These are tests that can help show problems with the heart rate or heart rhythm.   An X-ray exam may be done. Focal Neuropathy Diagnosis is made based on your symptoms and what your health care provider finds during your exam. Other tests may be done. They may include:  Nerve conduction velocities. This checks the transmission of electrical current through a nerve.  Electromyography. This shows how muscles respond to electrical signals transmitted by nearby nerves.  Quantitative sensory testing. This test is used to assess how your nerves respond to vibration and changes in temperature. Radiculoplexus Neuropathy  Often the first thing is to eliminate any other issue or problems that might be the cause, as there is no stick test for diagnosis.  X-ray exam of your spine and lumbar region.  Spinal tap to rule out cancer.  MRI to rule out other lesions. TREATMENT  Once nerve damage occurs, it cannot be reversed. The goal of treatment is to keep the disease or nerve damage from getting worse and affecting more nerve fibers. Controlling  your blood glucose level is the key. Most people with radiculoplexus neuropathy see at least a partial improvement over time. You will need to keep your blood glucose and HbA1c levels in the target range determined by your health care provider. Things that help control blood glucose levels include:   Blood glucose monitoring.   Meal planning.   Physical activity.   Diabetes medicine.  Over time, maintaining lower blood glucose levels helps lessen symptoms. Sometimes, prescription pain medicine is needed. HOME CARE INSTRUCTIONS:  Do not smoke.  Keep your blood glucose level in the range that you and your health care provider have determined acceptable for you.  Keep your blood pressure level in the range that you and your health care provider have determined acceptable for you.  Eat a well-balanced diet.  Be active every day.  Check your feet every day. SEEK MEDICAL CARE IF:   You have burning, stabbing, or aching pain in the legs or feet.  You are unable to feel pressure or pain in your feet.  You develop problems with digestion such as:  Nausea.  Vomiting.  Bloating.  Constipation.  Diarrhea.  Abdominal pain.  You have difficulty with urination, such as:  Incontinence.  Retention.  You have palpitations.  You develop orthostatic hypotension. When you stand up you may feel:  Dizzy.  Weak.  Faint.  You cannot attain and maintain an erection (in men).  You have vaginal dryness and problems with decreased sexual desire and arousal (in women).  You have severe pain in  your thighs, legs, or buttocks.  You have unexplained weight loss. Document Released: 02/27/2001 Document Revised: 10/09/2012 Document Reviewed: 05/30/2012 [facility] Patient Information 2015 [place], [place]. This information is not intended to replace advice given to you by your health care provider. Make sure you discuss any questions you have with your health care provider.

## 2014-02-04 NOTE — Progress Notes (Signed)
Subjective:  This chart was scribed for Russell Haber, MD, by Tamsen Roers, at [facility]  This patient was seen in room 9 and the patient's care was started at 8:46 AM.    Patient ID: Russell [name], male    DOB: 1970/02/15, 44 y.o.   MRN: [id number]  HPI  HPI Comments: Russell [name] is a 44 y.o. male who presents to [facility] for a follow up for the blood clot in his right leg.  Patient notes that Naprosyn has been taking his pain away but notes that he has been extremely sleepy and has not been able to get out of bed. Patient has associated symptoms of foot pain when he puts on his shoes and numbness at times.  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 Patient has no other complaints today.   Patient has had some palpitations but no shortness of breath, chest pain, diaphoresis, nausea. He has had a little bit of loss of appetite.   Patient Active Problem List   Diagnosis Date Noted   Anxiety state, unspecified 03/12/2013   Type 2 diabetes mellitus 12/19/2011   Hyperlipidemia 12/19/2011   Varicose veins 12/19/2011   Back pain, chronic 12/19/2011   Past Medical History  Diagnosis Date   Thyroid disease    Diabetes mellitus without complication    Hypertension    Past Surgical History  Procedure Laterality Date   Right ankle fx repair     Eye surgery     No Known Allergies Prior to Admission medications   Medication Sig Start Date End Date Taking? Authorizing Provider  albuterol (PROVENTIL HFA;VENTOLIN HFA) 108 (90 BASE) MCG/ACT inhaler Inhale 2 puffs into the lungs every 6 (six) hours as needed for wheezing or shortness of breath. 10/22/13  Yes Russell Haber, MD  ALPRAZolam Duanne Moron) 0.5 MG tablet TAKE 1 TABLET TWICE DAILY AS NEEDED FOR SLEEP 09/08/13  Yes Russell Haber, MD  Blood Glucose Monitoring  Suppl (ONETOUCH VERIO IQ SYSTEM) W/DEVICE KIT 1 each by Does not apply route. 06/02/13  Yes Historical Provider, MD  cyclobenzaprine (FLEXERIL) 10 MG tablet Take 1 tablet (10 mg total) by mouth 3 (three) times daily as needed for muscle spasms. 12/28/13  Yes Thao P Le, DO  doxycycline (VIBRA-TABS) 100 MG tablet Take 1 tablet (100 mg total) by mouth 2 (two) times daily. 01/27/14  Yes Russell Haber, MD  furosemide (LASIX) 20 MG tablet Take 1 tablet (20 mg total) by mouth daily. 01/27/14  Yes Russell Haber, MD  gabapentin (NEURONTIN) 600 MG tablet Take 600 mg by mouth 4 (four) times daily.  12/19/11  Yes Russell Haber, MD  glucose blood [facility] VERIO) test strip 7-8 times per day 06/02/13 06/02/14 Yes Historical Provider, MD  HYDROcodone-acetaminophen (NORCO) 5-325 MG per tablet Take 1 tablet by mouth every 6 (six) hours as needed. 12/28/13  Yes Thao P Le, DO  ibuprofen (ADVIL,MOTRIN) 800 MG tablet Take 1 tablet (800 mg total) by mouth every 8 (eight) hours as needed. 01/28/13  Yes Russell Haber, MD  indomethacin (INDOCIN) 50 MG capsule Take 1 capsule (50 mg total) by mouth 3 (three) times daily with meals. 06/29/13  Yes Eleanore E Egan, PA-C  insulin glargine (LANTUS) 100 UNIT/ML injection Inject 60 Units into the skin 2 (two) times daily.    Yes Historical Provider, MD  Insulin  Glulisine (APIDRA SOLOSTAR) 100 UNIT/ML Solostar Pen Inject 15 Units into the skin. 06/19/13  Yes Historical Provider, MD  ketoconazole (NIZORAL) 2 % cream Apply 1 application topically daily. 02/01/14  Yes Russell Haber, MD  levothyroxine (SYNTHROID, LEVOTHROID) 50 MCG tablet Take 1 tablet (50 mcg total) by mouth daily. 05/02/13  Yes Russell Haber, MD  Liraglutide 18 MG/3ML SOPN Inject 1.2 ml subcutaneous every am 02/01/14  Yes Russell Haber, MD  lisinopril (PRINIVIL,ZESTRIL) 20 MG tablet Take 1 tablet (20 mg total) by mouth daily. 12/28/13  Yes Thao P Le, DO  metFORMIN (GLUCOPHAGE) 1000 MG tablet Take 1,000 mg by mouth 2  (two) times daily with a meal.  06/02/13 06/02/14 Yes Historical Provider, MD  methylPREDNIsolone (MEDROL DOSPACK) 4 MG tablet follow package directions. Patient is to get 2 packs so that it will be a longer taper. 12/30/13  Yes Thao P Le, DO  mometasone (ASMANEX 60 METERED DOSES) 220 MCG/INH inhaler Inhale 2 puffs into the lungs daily. 01/27/14  Yes Russell Haber, MD  naproxen (NAPROSYN) 500 MG tablet Take 1 tablet (500 mg total) by mouth 2 (two) times daily with a meal. 02/01/14  Yes Russell Haber, MD  Testosterone (ANDROGEL) 20.25 MG/1.25GM (1.62%) GEL Place 1 applicator onto the skin daily. 02/01/14  Yes Russell Haber, MD   History   Social History   Marital Status: Single    Spouse Name: N/A    Number of Children: N/A   Years of Education: N/A   Occupational History   Not on file.   Social History Main Topics   Smoking status: Never Smoker    Smokeless tobacco: Not on file   Alcohol Use: No   Drug Use: No   Sexual Activity: Yes   Other Topics Concern   Not on file   Social History Narrative     Review of Systems  Constitutional: Positive for fatigue. Negative for fever and chills.  Musculoskeletal:       Foot pain bilaterally   Neurological: Positive for numbness.       Objective:   Physical Exam  Filed Vitals:   02/04/14 0826  BP: 166/108  Pulse: 116  Temp: 98.3 F (36.8 C)  TempSrc: Oral  Resp: 17  Height: 6' (1.829 m)  Weight: 256 lb (116.121 kg)  SpO2: 96%   We discussed the different approaches to diabetic neuropathy as well as the superficial phlebitis.  Chest is clear to auscultation. Heart is regular with about 100 bpm and without gallop. His blood pressure recheck is 160/100 and patient is quite anxious.     Assessment & Plan:  Patient is unable to take the Naprosyn while working because it makes him fatigued.  It does control his pain so we need to do something else.   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  I've asked patient to go home get on the Xarelto, digest affected he's going to be off work for a month, and then recheck his vital signs after his rest. If his blood pressure stays elevated, we may need to increase his blood pressure medicine  This chart was scribed in my presence and reviewed by me personally.    ICD-9-CM ICD-10-CM   1. Phlebitis 451.9 I80.9 Rivaroxaban (XARELTO STARTER PACK) 15 & 20 MG TBPK  2. Diabetic polyneuropathy associated  with diabetes mellitus due to underlying condition 249.60 E08.42    357.2     I'll have him come back at the end of February to decide whether anticoagulation is necessary for more than a month.  Signed, Russell Haber, MD

## 2014-02-24 DIAGNOSIS — Z0271 Encounter for disability determination: Secondary | ICD-10-CM

## 2014-02-27 ENCOUNTER — Ambulatory Visit (HOSPITAL_COMMUNITY)
Admission: RE | Admit: 2014-02-27 | Discharge: 2014-02-27 | Disposition: A | Discharge status: Home / Self Care | Payer: Managed Care, Other (non HMO) | Source: Ambulatory Visit | Attending: Cardiology | Admitting: Cardiology

## 2014-02-27 ENCOUNTER — Telehealth: Payer: Self-pay

## 2014-02-27 ENCOUNTER — Ambulatory Visit (INDEPENDENT_AMBULATORY_CARE_PROVIDER_SITE_OTHER): Payer: Managed Care, Other (non HMO) | Admitting: Family Medicine

## 2014-02-27 VITALS — BP 148/118 | HR 98 | Temp 97.9°F | Resp 18 | Wt 270.0 lb

## 2014-02-27 DIAGNOSIS — M7989 Other specified soft tissue disorders: Secondary | ICD-10-CM

## 2014-02-27 DIAGNOSIS — R600 Localized edema: Secondary | ICD-10-CM

## 2014-02-27 DIAGNOSIS — I83891 Varicose veins of right lower extremities with other complications: Secondary | ICD-10-CM | POA: Insufficient documentation

## 2014-02-27 DIAGNOSIS — Z7902 Long term (current) use of antithrombotics/antiplatelets: Secondary | ICD-10-CM | POA: Diagnosis not present

## 2014-02-27 NOTE — Telephone Encounter (Signed)
Advised pt of normal doppler and that we would call him back if you had any further instructions.

## 2014-02-27 NOTE — Patient Instructions (Signed)
Go to [facility] at 2:30 pm for [place] [phone]

## 2014-02-27 NOTE — Progress Notes (Signed)
Bilateral Venous Duplex Completed. Negative for DVT in both lower extremities. A small amount of residual thrombus remains in the right calf varicosities.  Marilynne Halstedita Damaso Laday, BS, RDMS, RVT

## 2014-02-27 NOTE — Progress Notes (Signed)
° °  Subjective:    Patient ID: Russell [name], male    DOB: June 29, 1970, 44 y.o.   MRN: [id number]  This chart was scribed for Russell SidleKurt Lauenstein, MD, by Ronney LionSuzanne Le, ED Scribe. This patient was seen in room 12 and the patient's care was started at 12:05 PM.   Chief Complaint  Patient presents with   Follow-up   DVT     HPI  HPI Comments: Russell BirkenheadSteven B [name] is a 44 y.o. male with Type 2 DM who presents to the [facility] for a follow-up for a right leg blood clot and diabetic neuropathy and complaining of severe, worsening, persistent bilateral lower extremity pain, right greater than left, radiating up his right leg into his groin. Patient has been compliant with Xarelto, but patient states his feet swell up and hurt for days, and he will wake in the middle of the night crying. Wearing shoes will exacerbate his pain.   Patient is discouraged because his blood pressure has been high. He has been taking Lasix 20 mg.   However, his blood sugar has been improving, and was 125 this morning.   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 He states the paperwork was not been faxed over to him by our office last month.  Patient is due to receive injections for diabetic retinopathy, which he is unsure whether his insurance will cover.  Review of Systems  Constitutional: Negative for fever and chills.  Musculoskeletal: Positive for myalgias.      Objective:   Physical Exam  Constitutional: He is oriented to person, place, and time. He appears well-developed and well-nourished. No distress.  HENT:  Head: Normocephalic and atraumatic.  Eyes: Conjunctivae and EOM are normal.  Neck: Neck supple. No tracheal deviation present.  Cardiovascular: Normal rate.   Pulmonary/Chest: Effort normal. No respiratory distress. He has wheezes.  Musculoskeletal: Normal range of motion.    Neurological: He is alert and oriented to person, place, and time.  Skin: Skin is warm and dry.  Psychiatric: He has a normal mood and affect. His behavior is normal.  Nursing note and vitals reviewed.  patient has expiratory wheezes  Patient has a lateral 3+ pitting edema up to his knees. His feet look like they're intact with good pulses and no ulcerations at this point. He's healed the heel ulcers. He has no diabetic calluses.  Patient shows me a report from his eye doctor showing diabetic macular edema for which she's going to need very expensive intraocular injections.  I spent 15 minutes filling out insurance forms.    Assessment & Plan:  Is a complicated patient who has significant fluid retention resulting in some wheezing when he lies down, bilateral pedal edema, and related DVT.  Patient needs to increase his Lasix to 40 mg daily and we are proceeding with venous Dopplers to monitor the deep venous thrombosis. If he has increased in clotting, will need to switch to Coumadin.  Signed, Sheila OatsKurt [name] M.D.

## 2014-03-13 ENCOUNTER — Ambulatory Visit (INDEPENDENT_AMBULATORY_CARE_PROVIDER_SITE_OTHER): Payer: Managed Care, Other (non HMO) | Admitting: Family Medicine

## 2014-03-13 VITALS — BP 162/106 | HR 100 | Temp 97.8°F | Resp 18 | Ht 72.0 in | Wt 262.2 lb

## 2014-03-13 DIAGNOSIS — I809 Phlebitis and thrombophlebitis of unspecified site: Secondary | ICD-10-CM | POA: Diagnosis not present

## 2014-03-13 DIAGNOSIS — R600 Localized edema: Secondary | ICD-10-CM | POA: Diagnosis not present

## 2014-03-13 DIAGNOSIS — I10 Essential (primary) hypertension: Secondary | ICD-10-CM

## 2014-03-13 MED ORDER — FUROSEMIDE 40 MG PO TABS: 40.0000 mg | ORAL_TABLET | Freq: Every day | ORAL | Status: DC

## 2014-03-13 MED ORDER — LISINOPRIL 20 MG PO TABS: 20.0000 mg | ORAL_TABLET | Freq: Every day | ORAL | Status: DC

## 2014-03-13 MED ORDER — RIVAROXABAN 20 MG PO TABS: 20.0000 mg | ORAL_TABLET | Freq: Every day | ORAL | Status: DC

## 2014-03-13 MED ORDER — CLONIDINE HCL 0.1 MG/24HR TD PTWK: 0.1000 mg | MEDICATED_PATCH | TRANSDERMAL | Status: DC

## 2014-03-13 NOTE — Progress Notes (Signed)
° °  Subjective:    Patient ID: Russell [name], male    DOB: February 08, 1970, 44 y.o.   MRN: [id number] This chart was scribed for Russell SidleKurt Lauenstein, MD, by Ronney LionSuzanne Le, ED Scribe. This patient was seen in room 12 and the patient's care was started at 11:25 AM.   HPI HPI Comments: Russell BirkenheadSteven B [name] is a 44 y.o. male with Type 2 DM who presents to the [facility] complaining of a follow-up for a right leg blood clot and diabetic neuropathy. He states he has been doing well, but has intermittent pain in his right leg. Patient complains of edema that is worse in the mid-day. He states he ran out of Xarelto 20 mg.  He states he has been maintaining his blood sugar, and it has been up and down, with the highest reading recently that he can recall at around 180.   Patient is currently taking Lisinopril 20 mg, but states he ran out of Lasix. He reports continued high blood pressure, but states that today his blood pressure isn't too high. He endorses associated headaches when his pressure is high.   Patient had an injection on his right eye for macular edema by Dr. Allyne GeeSanders. He is scheduled to have one done in his left eye. Patient states that each injection costs $5000.   Patient reports he would like to have a job where he can sit at a desk.   Review of Systems  Cardiovascular: Positive for leg swelling.  Musculoskeletal: Positive for myalgias.  Neurological: Positive for headaches.       Objective:   Physical Exam  Constitutional: He is oriented to person, place, and time. He appears well-developed and well-nourished. No distress.  HENT:  Head: Normocephalic and atraumatic.  Eyes: Conjunctivae and EOM are normal.  Neck: Neck supple. No tracheal deviation present.  Cardiovascular: Normal rate.   Pulmonary/Chest: Effort normal. No respiratory distress.  Musculoskeletal: Normal range of motion.  Neurological: He is alert and oriented to person, place, and time.  Skin: Skin is warm  and dry.  Right foot: Healing ulcer on right great toe and on the posterior heel. Left foot: Healing blister on the left great toe and left heel.  Psychiatric: He has a normal mood and affect. His behavior is normal.  Nursing note and vitals reviewed.  Blood Pressure Taken with Manual Sphygmomanometer by Dr. Milus GlazierLauenstein: 160/100  I spent about 15 minutes time face to face with patient filling out disability forms.      Assessment & Plan:   This chart was scribed in my presence and reviewed by me personally.    ICD-9-CM ICD-10-CM   1. Thrombophlebitis 451.9 I80.9 rivaroxaban (XARELTO) 20 MG TABS tablet  2. Essential hypertension 401.9 I10 cloNIDine (CATAPRES - DOSED IN MG/24 HR) 0.1 mg/24hr patch     lisinopril (PRINIVIL,ZESTRIL) 20 MG tablet  3. Pedal edema 782.3 R60.0 furosemide (LASIX) 40 MG tablet   Recheck one month  Signed, Russell SidleKurt Lauenstein, MD

## 2014-03-24 ENCOUNTER — Ambulatory Visit (INDEPENDENT_AMBULATORY_CARE_PROVIDER_SITE_OTHER): Payer: Managed Care, Other (non HMO) | Admitting: Internal Medicine

## 2014-03-24 ENCOUNTER — Encounter (HOSPITAL_COMMUNITY): Payer: Self-pay | Admitting: Emergency Medicine

## 2014-03-24 ENCOUNTER — Emergency Department (HOSPITAL_COMMUNITY): Payer: Managed Care, Other (non HMO)

## 2014-03-24 ENCOUNTER — Inpatient Hospital Stay (HOSPITAL_COMMUNITY)
Admission: EM | Admit: 2014-03-24 | Discharge: 2014-03-29 | DRG: 638 | Disposition: A | Discharge status: Home / Self Care | Payer: Managed Care, Other (non HMO) | Attending: Internal Medicine | Admitting: Internal Medicine

## 2014-03-24 VITALS — BP 110/84 | HR 116 | Temp 97.5°F | Resp 16 | Ht 72.0 in | Wt 242.0 lb

## 2014-03-24 DIAGNOSIS — Z8672 Personal history of thrombophlebitis: Secondary | ICD-10-CM | POA: Diagnosis not present

## 2014-03-24 DIAGNOSIS — K3184 Gastroparesis: Secondary | ICD-10-CM | POA: Diagnosis present

## 2014-03-24 DIAGNOSIS — R112 Nausea with vomiting, unspecified: Secondary | ICD-10-CM | POA: Diagnosis not present

## 2014-03-24 DIAGNOSIS — E1122 Type 2 diabetes mellitus with diabetic chronic kidney disease: Secondary | ICD-10-CM | POA: Diagnosis not present

## 2014-03-24 DIAGNOSIS — E1140 Type 2 diabetes mellitus with diabetic neuropathy, unspecified: Secondary | ICD-10-CM | POA: Diagnosis present

## 2014-03-24 DIAGNOSIS — R1110 Vomiting, unspecified: Secondary | ICD-10-CM | POA: Diagnosis present

## 2014-03-24 DIAGNOSIS — N189 Chronic kidney disease, unspecified: Secondary | ICD-10-CM | POA: Diagnosis not present

## 2014-03-24 DIAGNOSIS — Z791 Long term (current) use of non-steroidal anti-inflammatories (NSAID): Secondary | ICD-10-CM | POA: Diagnosis not present

## 2014-03-24 DIAGNOSIS — K210 Gastro-esophageal reflux disease with esophagitis: Secondary | ICD-10-CM | POA: Diagnosis present

## 2014-03-24 DIAGNOSIS — R1084 Generalized abdominal pain: Secondary | ICD-10-CM

## 2014-03-24 DIAGNOSIS — E785 Hyperlipidemia, unspecified: Secondary | ICD-10-CM

## 2014-03-24 DIAGNOSIS — Z7901 Long term (current) use of anticoagulants: Secondary | ICD-10-CM | POA: Diagnosis not present

## 2014-03-24 DIAGNOSIS — I10 Essential (primary) hypertension: Secondary | ICD-10-CM | POA: Diagnosis present

## 2014-03-24 DIAGNOSIS — D72829 Elevated white blood cell count, unspecified: Secondary | ICD-10-CM

## 2014-03-24 DIAGNOSIS — E1310 Other specified diabetes mellitus with ketoacidosis without coma: Secondary | ICD-10-CM | POA: Diagnosis present

## 2014-03-24 DIAGNOSIS — Z794 Long term (current) use of insulin: Secondary | ICD-10-CM | POA: Diagnosis not present

## 2014-03-24 DIAGNOSIS — I82409 Acute embolism and thrombosis of unspecified deep veins of unspecified lower extremity: Secondary | ICD-10-CM

## 2014-03-24 DIAGNOSIS — M542 Cervicalgia: Secondary | ICD-10-CM

## 2014-03-24 DIAGNOSIS — E1169 Type 2 diabetes mellitus with other specified complication: Secondary | ICD-10-CM | POA: Diagnosis present

## 2014-03-24 DIAGNOSIS — M549 Dorsalgia, unspecified: Secondary | ICD-10-CM

## 2014-03-24 DIAGNOSIS — R52 Pain, unspecified: Secondary | ICD-10-CM

## 2014-03-24 DIAGNOSIS — N179 Acute kidney failure, unspecified: Secondary | ICD-10-CM | POA: Diagnosis present

## 2014-03-24 DIAGNOSIS — R1310 Dysphagia, unspecified: Secondary | ICD-10-CM | POA: Diagnosis present

## 2014-03-24 DIAGNOSIS — E039 Hypothyroidism, unspecified: Secondary | ICD-10-CM | POA: Diagnosis present

## 2014-03-24 DIAGNOSIS — R000 Tachycardia, unspecified: Secondary | ICD-10-CM | POA: Diagnosis present

## 2014-03-24 DIAGNOSIS — Z86718 Personal history of other venous thrombosis and embolism: Secondary | ICD-10-CM

## 2014-03-24 DIAGNOSIS — Z79891 Long term (current) use of opiate analgesic: Secondary | ICD-10-CM | POA: Diagnosis not present

## 2014-03-24 DIAGNOSIS — E669 Obesity, unspecified: Secondary | ICD-10-CM

## 2014-03-24 DIAGNOSIS — R739 Hyperglycemia, unspecified: Secondary | ICD-10-CM | POA: Diagnosis present

## 2014-03-24 DIAGNOSIS — R7309 Other abnormal glucose: Secondary | ICD-10-CM | POA: Diagnosis not present

## 2014-03-24 DIAGNOSIS — I809 Phlebitis and thrombophlebitis of unspecified site: Secondary | ICD-10-CM

## 2014-03-24 DIAGNOSIS — E1110 Type 2 diabetes mellitus with ketoacidosis without coma: Secondary | ICD-10-CM | POA: Diagnosis present

## 2014-03-24 DIAGNOSIS — H3530 Unspecified macular degeneration: Secondary | ICD-10-CM | POA: Diagnosis present

## 2014-03-24 DIAGNOSIS — G8929 Other chronic pain: Secondary | ICD-10-CM | POA: Diagnosis present

## 2014-03-24 HISTORY — DX: Acute embolism and thrombosis of unspecified deep veins of unspecified lower extremity: I82.409

## 2014-03-24 LAB — CBC
HCT: 42.5 % (ref 39.0–52.0)
HEMATOCRIT: 44.9 % (ref 39.0–52.0)
Hemoglobin: 15.8 g/dL (ref 13.0–17.0)
Hemoglobin: 15.2 g/dL (ref 13.0–17.0)
MCH: 29.5 pg (ref 26.0–34.0)
MCH: 29.9 pg (ref 26.0–34.0)
MCHC: 35.2 g/dL (ref 30.0–36.0)
MCHC: 35.8 g/dL (ref 30.0–36.0)
MCV: 83.9 fL (ref 78.0–100.0)
MCV: 83.7 fL (ref 78.0–100.0)
PLATELETS: 288 10*3/uL (ref 150–400)
Platelets: 283 10*3/uL (ref 150–400)
RBC: 5.35 MIL/uL (ref 4.22–5.81)
RBC: 5.08 MIL/uL (ref 4.22–5.81)
RDW: 13 % (ref 11.5–15.5)
RDW: 12.9 % (ref 11.5–15.5)
WBC: 15.6 10*3/uL — ABNORMAL HIGH (ref 4.0–10.5)
WBC: 15.9 10*3/uL — ABNORMAL HIGH (ref 4.0–10.5)

## 2014-03-24 LAB — POCT CBC
Granulocyte percent: 91.7 %G — AB (ref 37–80)
HCT, POC: 50.9 % (ref 43.5–53.7)
HEMOGLOBIN: 16.9 g/dL (ref 14.1–18.1)
Lymph, poc: 0.9 (ref 0.6–3.4)
MCH, POC: 28.8 pg (ref 27–31.2)
MCHC: 33.3 g/dL (ref 31.8–35.4)
MCV: 86.5 fL (ref 80–97)
MID (cbc): 0.5 (ref 0–0.9)
MPV: 8.8 fL (ref 0–99.8)
POC Granulocyte: 15.1 — AB (ref 2–6.9)
POC LYMPH PERCENT: 5.2 %L — AB (ref 10–50)
POC MID %: 3.1 %M (ref 0–12)
Platelet Count, POC: 301 10*3/uL (ref 142–424)
RBC: 5.89 M/uL (ref 4.69–6.13)
RDW, POC: 13.3 %
WBC: 16.5 10*3/uL — AB (ref 4.6–10.2)

## 2014-03-24 LAB — GLUCOSE, CAPILLARY
GLUCOSE-CAPILLARY: 320 mg/dL — AB (ref 70–99)
GLUCOSE-CAPILLARY: 185 mg/dL — AB (ref 70–99)
Glucose-Capillary: 293 mg/dL — ABNORMAL HIGH (ref 70–99)
Glucose-Capillary: 242 mg/dL — ABNORMAL HIGH (ref 70–99)
Glucose-Capillary: 252 mg/dL — ABNORMAL HIGH (ref 70–99)

## 2014-03-24 LAB — URINE MICROSCOPIC-ADD ON

## 2014-03-24 LAB — COMPREHENSIVE METABOLIC PANEL
ALK PHOS: 101 U/L (ref 39–117)
ALK PHOS: 99 U/L (ref 39–117)
ALT: 39 U/L (ref 0–53)
ALT: 41 U/L (ref 0–53)
AST: 36 U/L (ref 0–37)
AST: 39 U/L — ABNORMAL HIGH (ref 0–37)
Albumin: 3.6 g/dL (ref 3.5–5.2)
Albumin: 3.4 g/dL — ABNORMAL LOW (ref 3.5–5.2)
Anion gap: 16 — ABNORMAL HIGH (ref 5–15)
BILIRUBIN TOTAL: 0.6 mg/dL (ref 0.2–1.2)
BUN: 40 mg/dL — AB (ref 6–23)
BUN: 44 mg/dL — ABNORMAL HIGH (ref 6–23)
CO2: 19 mEq/L (ref 19–32)
CO2: 22 mmol/L (ref 19–32)
Calcium: 9.5 mg/dL (ref 8.4–10.5)
Calcium: 8.8 mg/dL (ref 8.4–10.5)
Chloride: 92 mEq/L — ABNORMAL LOW (ref 96–112)
Chloride: 99 mmol/L (ref 96–112)
Creat: 1.8 mg/dL — ABNORMAL HIGH (ref 0.50–1.35)
Creatinine, Ser: 1.8 mg/dL — ABNORMAL HIGH (ref 0.50–1.35)
GFR calc Af Amer: 52 mL/min — ABNORMAL LOW (ref >90)
GFR calc non Af Amer: 44 mL/min — ABNORMAL LOW (ref >90)
GLUCOSE: 695 mg/dL — AB (ref 70–99)
GLUCOSE: 583 mg/dL — AB (ref 70–99)
POTASSIUM: 4.4 mmol/L (ref 3.5–5.1)
Potassium: 4.9 mEq/L (ref 3.5–5.3)
Sodium: 134 mEq/L — ABNORMAL LOW (ref 135–145)
Sodium: 137 mmol/L (ref 135–145)
Total Bilirubin: 1.1 mg/dL (ref 0.3–1.2)
Total Protein: 6.2 g/dL (ref 6.0–8.3)
Total Protein: 6.3 g/dL (ref 6.0–8.3)

## 2014-03-24 LAB — URINALYSIS, ROUTINE W REFLEX MICROSCOPIC
Bilirubin Urine: NEGATIVE
Glucose, UA: >1000 mg/dL — AB
Ketones, ur: 15 mg/dL — AB
Leukocytes, UA: NEGATIVE
Nitrite: NEGATIVE
Specific Gravity, Urine: 1.035 — ABNORMAL HIGH (ref 1.005–1.030)
UROBILINOGEN UA: 0.2 mg/dL (ref 0.0–1.0)
pH: 5 (ref 5.0–8.0)

## 2014-03-24 LAB — MRSA PCR SCREENING: MRSA BY PCR: NEGATIVE

## 2014-03-24 LAB — I-STAT TROPONIN, ED: TROPONIN I, POC: 0.02 ng/mL (ref 0.00–0.08)

## 2014-03-24 LAB — BASIC METABOLIC PANEL
ANION GAP: 11 (ref 5–15)
BUN: 42 mg/dL — AB (ref 6–23)
CO2: 23 mmol/L (ref 19–32)
Calcium: 8.3 mg/dL — ABNORMAL LOW (ref 8.4–10.5)
Chloride: 105 mmol/L (ref 96–112)
Creatinine, Ser: 1.63 mg/dL — ABNORMAL HIGH (ref 0.50–1.35)
GFR, EST AFRICAN AMERICAN: 58 mL/min — AB (ref >90)
GFR, EST NON AFRICAN AMERICAN: 50 mL/min — AB (ref >90)
Glucose, Bld: 424 mg/dL — ABNORMAL HIGH (ref 70–99)
POTASSIUM: 3.8 mmol/L (ref 3.5–5.1)
Sodium: 139 mmol/L (ref 135–145)

## 2014-03-24 LAB — I-STAT CG4 LACTIC ACID, ED: Lactic Acid, Venous: 2.3 mmol/L (ref 0.5–2.0)

## 2014-03-24 LAB — CBG MONITORING, ED
GLUCOSE-CAPILLARY: 500 mg/dL — AB (ref 70–99)
Glucose-Capillary: 503 mg/dL — ABNORMAL HIGH (ref 70–99)

## 2014-03-24 LAB — LIPASE, BLOOD: Lipase: 16 U/L (ref 11–59)

## 2014-03-24 LAB — GLUCOSE, POCT (MANUAL RESULT ENTRY)

## 2014-03-24 LAB — LIPASE: Lipase: <10 U/L (ref 0–75)

## 2014-03-24 MED ORDER — LEVOTHYROXINE SODIUM 50 MCG PO TABS
50.0000 ug | ORAL_TABLET | Freq: Every day | ORAL | Status: DC
  Administered 2014-03-25 – 2014-03-29 (×5): 50 ug via ORAL
  Filled 2014-03-24 (×5): qty 1

## 2014-03-24 MED ORDER — HYDROMORPHONE HCL 1 MG/ML IJ SOLN: 1.0000 mg | Freq: Once | INTRAMUSCULAR | Status: DC

## 2014-03-24 MED ORDER — SODIUM CHLORIDE 0.9 % IV SOLN
INTRAVENOUS | Status: DC
  Administered 2014-03-24 (×2): via INTRAVENOUS
  Administered 2014-03-25: 1000 mL via INTRAVENOUS
  Administered 2014-03-26: 06:00:00 via INTRAVENOUS

## 2014-03-24 MED ORDER — ONDANSETRON HCL 4 MG/2ML IJ SOLN
2.0000 mg | Freq: Once | INTRAMUSCULAR | Status: AC
  Administered 2014-03-24: 2 mg via INTRAVENOUS

## 2014-03-24 MED ORDER — SODIUM CHLORIDE 0.9 % IV BOLUS (SEPSIS)
2000.0000 mL | Freq: Once | INTRAVENOUS | Status: AC
  Administered 2014-03-24: 2000 mL via INTRAVENOUS

## 2014-03-24 MED ORDER — LABETALOL HCL 5 MG/ML IV SOLN
10.0000 mg | INTRAVENOUS | Status: AC | PRN
  Administered 2014-03-24 – 2014-03-27 (×10): 10 mg via INTRAVENOUS
  Filled 2014-03-24 (×12): qty 4

## 2014-03-24 MED ORDER — CALCIUM GLUCONATE 10 % IV SOLN
1.0000 g | Freq: Once | INTRAVENOUS | Status: AC
  Administered 2014-03-24: 1 g via INTRAVENOUS
  Filled 2014-03-24: qty 10

## 2014-03-24 MED ORDER — ALPRAZOLAM 1 MG PO TABS: 1.0000 mg | ORAL_TABLET | Freq: Every evening | ORAL | Status: DC | PRN

## 2014-03-24 MED ORDER — SODIUM POLYSTYRENE SULFONATE 15 GM/60ML PO SUSP: 15.0000 g | Freq: Once | ORAL | Status: DC

## 2014-03-24 MED ORDER — KETOCONAZOLE 2 % EX CREA: 1.0000 "application " | TOPICAL_CREAM | Freq: Every day | CUTANEOUS | Status: DC

## 2014-03-24 MED ORDER — DEXTROSE-NACL 5-0.45 % IV SOLN: INTRAVENOUS | Status: DC

## 2014-03-24 MED ORDER — MORPHINE SULFATE 2 MG/ML IJ SOLN
1.0000 mg | INTRAMUSCULAR | Status: DC | PRN
Start: 2014-03-24 — End: 2014-03-29
  Administered 2014-03-25: 1 mg via INTRAVENOUS
  Filled 2014-03-24 (×2): qty 1

## 2014-03-24 MED ORDER — ONDANSETRON HCL 4 MG/2ML IJ SOLN
4.0000 mg | Freq: Once | INTRAMUSCULAR | Status: AC
  Administered 2014-03-24: 4 mg via INTRAVENOUS
  Filled 2014-03-24: qty 2

## 2014-03-24 MED ORDER — ONDANSETRON HCL 4 MG/2ML IJ SOLN
4.0000 mg | Freq: Once | INTRAMUSCULAR | Status: DC
Start: 2014-03-24 — End: 2014-03-24

## 2014-03-24 MED ORDER — LIP MEDEX EX OINT
TOPICAL_OINTMENT | CUTANEOUS | Status: AC
  Administered 2014-03-24: 20:00:00
  Filled 2014-03-24: qty 7

## 2014-03-24 MED ORDER — PROMETHAZINE HCL 25 MG/ML IJ SOLN
25.0000 mg | Freq: Four times a day (QID) | INTRAMUSCULAR | Status: DC | PRN
  Administered 2014-03-24 – 2014-03-29 (×16): 25 mg via INTRAVENOUS
  Filled 2014-03-24 (×17): qty 1

## 2014-03-24 MED ORDER — SODIUM CHLORIDE 0.9 % IV SOLN
INTRAVENOUS | Status: DC
  Administered 2014-03-24: 4.4 [IU]/h via INTRAVENOUS
  Filled 2014-03-24: qty 2.5

## 2014-03-24 MED ORDER — ONDANSETRON HCL 4 MG/2ML IJ SOLN
4.0000 mg | Freq: Four times a day (QID) | INTRAMUSCULAR | Status: DC | PRN
  Administered 2014-03-24 – 2014-03-26 (×6): 4 mg via INTRAVENOUS
  Filled 2014-03-24 (×6): qty 2

## 2014-03-24 MED ORDER — RIVAROXABAN 20 MG PO TABS
20.0000 mg | ORAL_TABLET | Freq: Every day | ORAL | Status: DC
  Administered 2014-03-24 – 2014-03-28 (×5): 20 mg via ORAL
  Filled 2014-03-24 (×7): qty 1

## 2014-03-24 MED ORDER — HEPARIN SODIUM (PORCINE) 5000 UNIT/ML IJ SOLN: 5000.0000 [IU] | Freq: Three times a day (TID) | INTRAMUSCULAR | Status: DC

## 2014-03-24 MED ORDER — CLONIDINE HCL 0.1 MG/24HR TD PTWK
0.1000 mg | MEDICATED_PATCH | TRANSDERMAL | Status: DC
  Administered 2014-03-24: 0.1 mg via TRANSDERMAL
  Filled 2014-03-24: qty 1

## 2014-03-24 MED ORDER — SODIUM CHLORIDE 0.9 % IV SOLN
INTRAVENOUS | Status: DC
  Administered 2014-03-24: 6.3 [IU]/h via INTRAVENOUS
  Administered 2014-03-24: 4.7 [IU]/h via INTRAVENOUS
  Filled 2014-03-24: qty 2.5

## 2014-03-24 NOTE — ED Provider Notes (Signed)
CSN: [id number]     Arrival date & time 03/24/14  1234 History   First MD Initiated Contact with Patient 03/24/14 1247     Chief Complaint  Patient presents with  . Emesis  . Nausea  . Hyperglycemia     (Consider location/radiation/quality/duration/timing/severity/associated sxs/prior Treatment) Patient is a 44 y.o. male presenting with vomiting and hyperglycemia. The history is provided by the patient.  Emesis Severity:  Moderate Duration:  2 days Timing:  Constant Quality:  Stomach contents Progression:  Unchanged Chronicity:  New Relieved by:  Nothing Worsened by:  Nothing tried Associated symptoms: no abdominal pain and no diarrhea   Risk factors: diabetes   Hyperglycemia Associated symptoms: nausea and vomiting   Associated symptoms: no abdominal pain, no chest pain, no fever and no shortness of breath     Past Medical History  Diagnosis Date  . Thyroid disease   . Diabetes mellitus without complication   . Hypertension    Past Surgical History  Procedure Laterality Date  . Right ankle fx repair    . Eye surgery     No family history on file. History  Substance Use Topics  . Smoking status: Never Smoker   . Smokeless tobacco: Not on file  . Alcohol Use: No    Review of Systems  Constitutional: Negative for fever.  Respiratory: Negative for cough and shortness of breath.   Cardiovascular: Negative for chest pain and leg swelling.  Gastrointestinal: Positive for nausea and vomiting. Negative for abdominal pain and diarrhea.  All other systems reviewed and are negative.     Allergies  Review of patient's allergies indicates no known allergies.  Home Medications   Prior to Admission medications   Medication Sig Start Date End Date Taking? Authorizing Provider  albuterol (PROVENTIL HFA;VENTOLIN HFA) 108 (90 BASE) MCG/ACT inhaler Inhale 2 puffs into the lungs every 6 (six) hours as needed for wheezing or shortness of breath. 10/22/13   [name],  MD  ALPRAZolam Duanne Moron) 0.5 MG tablet TAKE 1 TABLET TWICE DAILY AS NEEDED FOR SLEEP 09/08/13   [name], MD  Blood Glucose Monitoring Suppl (ONETOUCH VERIO IQ SYSTEM) W/DEVICE KIT 1 each by Does not apply route. 06/02/13   Historical Provider, MD  cloNIDine (CATAPRES - DOSED IN MG/24 HR) 0.1 mg/24hr patch Place 1 patch (0.1 mg total) onto the skin once a week. 03/13/14   [name], MD  cyclobenzaprine (FLEXERIL) 10 MG tablet Take 1 tablet (10 mg total) by mouth 3 (three) times daily as needed for muscle spasms. 12/28/13   Thao P Le, DO  furosemide (LASIX) 40 MG tablet Take 1 tablet (40 mg total) by mouth daily. 03/13/14   [name], MD  gabapentin (NEURONTIN) 600 MG tablet Take 600 mg by mouth 4 (four) times daily.  12/19/11   [name], MD  glucose blood [facility] VERIO) test strip 7-8 times per day 06/02/13 06/02/14  Historical Provider, MD  HYDROcodone-acetaminophen (NORCO) 5-325 MG per tablet Take 1 tablet by mouth every 6 (six) hours as needed. 12/28/13   Thao P Le, DO  ibuprofen (ADVIL,MOTRIN) 800 MG tablet Take 1 tablet (800 mg total) by mouth every 8 (eight) hours as needed. 01/28/13   [name], MD  insulin glargine (LANTUS) 100 UNIT/ML injection Inject 60 Units into the skin 2 (two) times daily.     Historical Provider, MD  Insulin Glulisine (APIDRA SOLOSTAR) 100 UNIT/ML Solostar Pen Inject 15 Units into the skin. 06/19/13   Historical Provider, MD  ketoconazole (  NIZORAL) 2 % cream Apply 1 application topically daily. 02/01/14   [name], MD  levothyroxine (SYNTHROID, LEVOTHROID) 50 MCG tablet Take 1 tablet (50 mcg total) by mouth daily. 05/02/13   [name], MD  lisinopril (PRINIVIL,ZESTRIL) 20 MG tablet Take 1 tablet (20 mg total) by mouth daily. 03/13/14   [name], MD  metFORMIN (GLUCOPHAGE) 1000 MG tablet Take 1,000 mg by mouth 2 (two) times daily with a meal.  06/02/13 06/02/14  Historical Provider, MD  mometasone (ASMANEX 60 METERED DOSES) 220 MCG/INH  inhaler Inhale 2 puffs into the lungs daily. 01/27/14   [name], MD  naproxen (NAPROSYN) 500 MG tablet Take 1 tablet (500 mg total) by mouth 2 (two) times daily with a meal. 02/01/14   [name], MD  rivaroxaban (XARELTO) 20 MG TABS tablet Take 1 tablet (20 mg total) by mouth daily with supper. 03/13/14   [name], MD  Testosterone (ANDROGEL) 20.25 MG/1.25GM (1.62%) GEL Place 1 applicator onto the skin daily. 02/01/14   [name], MD   There were no vitals taken for this visit. Physical Exam  Constitutional: He is oriented to person, place, and time. He appears well-developed and well-nourished. No distress.  HENT:  Head: Normocephalic and atraumatic.  Mouth/Throat: Oropharynx is clear and moist. No oropharyngeal exudate.  Eyes: EOM are normal. Pupils are equal, round, and reactive to light.  Neck: Normal range of motion. Neck supple.  Cardiovascular: Regular rhythm.  Tachycardia present.  Exam reveals no friction rub.   No murmur heard. Pulmonary/Chest: Effort normal and breath sounds normal. No respiratory distress. He has no wheezes. He has no rales.  Abdominal: Soft. He exhibits no distension. There is no tenderness. There is no rebound.  Musculoskeletal: Normal range of motion. He exhibits no edema.  Neurological: He is alert and oriented to person, place, and time. No cranial nerve deficit. He exhibits normal muscle tone. Coordination normal.  Skin: No rash noted. He is not diaphoretic.  Nursing note and vitals reviewed.   ED Course  Procedures (including critical care time) Labs Review Labs Reviewed  CBG MONITORING, ED - Abnormal; Notable for the following:    Glucose-Capillary 500 (*)    All other components within normal limits  CBC  COMPREHENSIVE METABOLIC PANEL  LIPASE, BLOOD  URINALYSIS, ROUTINE W REFLEX MICROSCOPIC  I-STAT TROPOININ, ED  I-STAT CG4 LACTIC ACID, ED    Imaging Review Dg Chest 2 View  03/24/2014   CLINICAL DATA:  44 year old  male with nausea vomiting for 2 days. Hyperglycemia. Leukocytosis. Initial encounter.  EXAM: CHEST  2 VIEW  COMPARISON:  12/28/2013.  FINDINGS: Semi upright AP and lateral views of the chest at 1332 hours. Lower lung volumes. Normal cardiac size and mediastinal contours. Visualized tracheal air column is within normal limits. No pneumothorax, pulmonary edema, pleural effusion or consolidation. Crowding of markings at the lung bases. No acute osseous abnormality identified.  IMPRESSION: Low lung volumes with mild bibasilar opacity most resembling atelectasis.   Electronically Signed   By: Genevie Ann M.D.   On: 03/24/2014 14:09     EKG Interpretation None     <ECGINTERP>  EKG: Sinus tachycardia, intervals normal, rate 118. Similar to prior MDM   Final diagnoses:  Hyperglycemia  Non-intractable vomiting with nausea, vomiting of unspecified type    64M with hx of diabetes presents with hyperglycemia. Hasn't taken any of his medications since he's been unable to eat due to constant vomiting for 2 days. No diarrhea or fever. No SOB  or CP. Here hyperglycemic, tachycardic. Belly benign. Will check labs, give fluids. CXR ok. Labs show hyperglycemia. Bicarb normal. Persistent tachycardia despite fluids. Glucostabilizer initiated. Patient admitted for his dehdyration, hyperglycemia.     Evelina Bucy, MD 03/24/14 [phone]

## 2014-03-24 NOTE — ED Notes (Signed)
Bed: WA09 Expected date:  Expected time:  Means of arrival:  Comments: EMS- [place]

## 2014-03-24 NOTE — ED Notes (Addendum)
Pt, being sent by PCP, c/o n/v x 2 days.  Pt's CBG read "high" and WBC 16.5.  Hx of DM and HTN.  2mg  Zofran administered in office.

## 2014-03-24 NOTE — ED Notes (Signed)
Per EMS pt comes from Urgent [facility] for n/v x 2 days.  Pt is diabetic and hasnt taken Metformin or Insulin due to unable to eat. Per EMS pt CBG 500s.  Pt had blood work at Urgent care and has elevated WBC.  Pt was given Zofran 4mg  IV at urgent Care.  Pt recently started on Xeralto for blood clots.

## 2014-03-24 NOTE — H&P (Signed)
[facility] History and Physical  Russell [name] QTM:[id number] DOB: 1970/10/07 DOA: 03/24/2014  Referring physician: Angelia Mould + ED PCP: [name], MD  Specialists: none  Chief Complaint:   N/v and abd pain x 46 days   44 year old male known poorly controlled diabetic type II diagnosed in 6256 with complications neuropathy/diabetic macular degeneration, prior olecranon bursitis/gout, poorly controlled HTN, recent diagnosis? Right lower extremity cellulitis/bilateral DVT/superficial phlebitis, "asthma" [facility]a.m. with nausea vomiting. New onset, -ill contacts, - chest pain, - cocaine use, - arm pain, - radiation of epigastric pain. Multiple episodes of emesis-"once every hour at least"-- outside food, - diarrhea, immunized against the flu this year + No new medications, - lower extremity swelling currently. Note PTA meds = Lasix 40 daily, ibuprofen 800 every 8, lisinopril 20 daily, Naprosyn 500 twice a day  Asian actually follows with you could endocrinology Dr. Francetta Found and was last seen by him 12/26/13 in [place] the last office note there was concern that patient may be eating more carbohydrates than he lets on-he apparently was also interested in getting an insulin pump  Emergency room workup = BUN/creatinine 44/1.8--baseline 18/0.9 in 12/2013, Anion gap 16, Lipase 16, AST/ALT 39/41 Potassium 4.4  WBC 15.6  CXR 2 view = low lung volumes with mild bibasilar opacity = atelectasis   Review of Systems:   A 14 point ROS was performed and is negative except as noted in the HPI   Past Medical History  Diagnosis Date  . Thyroid disease   . Diabetes mellitus without complication   . Hypertension    Past Surgical History  Procedure Laterality Date  . Right ankle fx repair    . Eye surgery     Social History:  History   Social History Narrative   Patient currently lives with his fiance   Works with facilities    Nonsmoker, nondrinker       No Known Allergies  Family History  Problem Relation Age of Onset  . Obesity Mother     Patient states that family members have no other medical illnesses other than what I have described     Prior to Admission medications   Medication Sig Start Date End Date Taking? Authorizing Provider  albuterol (PROVENTIL HFA;VENTOLIN HFA) 108 (90 BASE) MCG/ACT inhaler Inhale 2 puffs into the lungs every 6 (six) hours as needed for wheezing or shortness of breath. 10/22/13  Yes [name], MD  ALPRAZolam Duanne Moron) 0.5 MG tablet TAKE 1 TABLET TWICE DAILY AS NEEDED FOR SLEEP 09/08/13  Yes [name], MD  Blood Glucose Monitoring Suppl (ONETOUCH VERIO IQ SYSTEM) W/DEVICE KIT 1 each by Does not apply route. 06/02/13  Yes Historical Provider, MD  cyclobenzaprine (FLEXERIL) 10 MG tablet Take 1 tablet (10 mg total) by mouth 3 (three) times daily as needed for muscle spasms. 12/28/13  Yes Thao P Le, DO  furosemide (LASIX) 40 MG tablet Take 1 tablet (40 mg total) by mouth daily. 03/13/14  Yes [name], MD  gabapentin (NEURONTIN) 600 MG tablet Take 600 mg by mouth 4 (four) times daily.  12/19/11  Yes [name], MD  glucose blood [facility] VERIO) test strip 7-8 times per day 06/02/13 06/02/14 Yes Historical Provider, MD  HYDROcodone-acetaminophen (NORCO) 5-325 MG per tablet Take 1 tablet by mouth every 6 (six) hours as needed. 12/28/13  Yes Thao P Le, DO  ibuprofen (ADVIL,MOTRIN) 800 MG tablet Take 1 tablet (800 mg total) by mouth every 8 (eight) hours as  needed. 01/28/13  Yes [name], MD  insulin glargine (LANTUS) 100 UNIT/ML injection Inject 60 Units into the skin 2 (two) times daily.    Yes Historical Provider, MD  Insulin Glulisine (APIDRA SOLOSTAR) 100 UNIT/ML Solostar Pen Inject 15 Units into the skin. 06/19/13  Yes Historical Provider, MD  Insulin Lispro, Human, 200 UNIT/ML SOPN Inject 1-50 Units into the skin daily. 1 unit per 5 grams of carbohydrates with  meals/snacks plus coverage scale. Total 50 units/day 12/16/13  Yes Historical Provider, MD  levothyroxine (SYNTHROID, LEVOTHROID) 50 MCG tablet Take 1 tablet (50 mcg total) by mouth daily. 05/02/13  Yes [name], MD  lisinopril (PRINIVIL,ZESTRIL) 20 MG tablet Take 1 tablet (20 mg total) by mouth daily. 03/13/14  Yes [name], MD  metFORMIN (GLUCOPHAGE) 1000 MG tablet Take 1,000 mg by mouth 2 (two) times daily with a meal.  06/02/13 06/02/14 Yes Historical Provider, MD  mometasone (ASMANEX 60 METERED DOSES) 220 MCG/INH inhaler Inhale 2 puffs into the lungs daily. 01/27/14  Yes [name], MD  naproxen (NAPROSYN) 500 MG tablet Take 1 tablet (500 mg total) by mouth 2 (two) times daily with a meal. 02/01/14  Yes [name], MD  rivaroxaban (XARELTO) 20 MG TABS tablet Take 1 tablet (20 mg total) by mouth daily with supper. 03/13/14  Yes [name], MD  Testosterone (ANDROGEL) 20.25 MG/1.25GM (1.62%) GEL Place 1 applicator onto the skin daily. 02/01/14  Yes [name], MD  cloNIDine (CATAPRES - DOSED IN MG/24 HR) 0.1 mg/24hr patch Place 1 patch (0.1 mg total) onto the skin once a week. 03/13/14   [name], MD  ketoconazole (NIZORAL) 2 % cream Apply 1 application topically daily. 02/01/14   [name], MD   Physical Exam: Filed Vitals:   03/24/14 1302  BP: 153/101  Pulse: 123  Temp: 97.5 F (36.4 C)  TempSrc: Oral  Resp: 20  SpO2: 98%    EOMI NCAT funduscopy deferred, and no pallor no icterus Throat clear soft supple no JVD no bruit  Mallampati 2  S1-S2 tachycardic regular rate rhythm ,? 2/6 murmur left upper sternal edge Significant epigastric tenderness without radiation, no CVA tenderness Chest is clinically clear no rales no rhonchi no fremitus Lower extremities have excoriations bilaterally but no overt swelling or redness Neurologically intact, I did not elicit sensitivity or sensory    Labs on Admission:  Basic Metabolic Panel:  Recent Labs Lab  03/24/14 1336  NA 137  K 4.4  CL 99  CO2 22  GLUCOSE 583*  BUN 44*  CREATININE 1.80*  CALCIUM 8.8   Liver Function Tests:  Recent Labs Lab 03/24/14 1336  AST 39*  ALT 41  ALKPHOS 99  BILITOT 1.1  PROT 6.3  ALBUMIN 3.4*    Recent Labs Lab 03/24/14 1336  LIPASE 16   No results for input(s): AMMONIA in the last 168 hours. CBC:  Recent Labs Lab 03/24/14 1142 03/24/14 1336  WBC 16.5* 15.6*  HGB 16.9 15.8  HCT 50.9 44.9  MCV 86.5 83.9  PLT  --  288   Cardiac Enzymes: No results for input(s): CKTOTAL, CKMB, CKMBINDEX, TROPONINI in the last 168 hours.  BNP (last 3 results) No results for input(s): BNP in the last 8760 hours.  ProBNP (last 3 results) No results for input(s): PROBNP in the last 8760 hours.  CBG:  Recent Labs Lab 03/24/14 1246 03/24/14 1455  GLUCAP 500* 503*    Radiological Exams on Admission: Dg Chest 2 View  03/24/2014  CLINICAL DATA:  44 year old male with nausea vomiting for 2 days. Hyperglycemia. Leukocytosis. Initial encounter.  EXAM: CHEST  2 VIEW  COMPARISON:  12/28/2013.  FINDINGS: Semi upright AP and lateral views of the chest at 1332 hours. Lower lung volumes. Normal cardiac size and mediastinal contours. Visualized tracheal air column is within normal limits. No pneumothorax, pulmonary edema, pleural effusion or consolidation. Crowding of markings at the lung bases. No acute osseous abnormality identified.  IMPRESSION: Low lung volumes with mild bibasilar opacity most resembling atelectasis.   Electronically Signed   By: Genevie Ann M.D.   On: 03/24/2014 14:09    EKG: Independently reviewe sinus tachycardia PR interval 0.20 possible rate related changes with possible hyperkalemia changes? T-wave peaking in V1 and V2.  Assessment/Plan Active Problems:     Type 2 diabetes mellitus with lactic acidosis without coma-patient probably has DKA which is one cause of his lactic acidosis-patient also has a type II lactic acidosis secondary to  recent use of metformin in setting of recent use of lisinopril, Naprosyn, ibuprofen as well as other NSAIDs which can all cause renal insufficiency   I do not think that his abdominal pain is related to pancreatitis as his lipase as measured by the emergency room 16. He does not need imaging at this present time.   He will however need further aggressive IV fluid resuscitation given his heart rate is in the 120s and I think that he has volume depletion from DKA state.   I have asked the emergency room to bolus him to further liters of saline-he is already received 1   He will need to be placed on the Glucomander as his sugars to 500 range   He has moderate to severe DKA has he's a little bit sleepy and will need to be admitted to step down unit for further management and care   Please contact night coverage when his anion gap resolves for transition to by mouth as well as supplemental scale insulin   Note that his home dose of insulin is Lantus 60 units daily at bedtime and up E try 15 units 3 times a day    Type 2 diabetes mellitus-diagnosed ~2010--previously used to see Dr. Loanne Drilling . Blood sugars recently as per last office notes have been between the 160s and 210's.  He will need to follow-up further with Dr. Francetta Found with [facility]    Hyperlipidemia-hold off on any lipid panel for right now    Back pain, chronic-when the patient able to take it, we'll restart gabapentin as well as his other medications . He can continue his Xanax daily at bedtime for supportive water    Secondary DM with DKA-AG=16, BIcarb Nl--- see above discussion     DVT (deep venous thrombosis), H/o 01/2014-on Xarelto-patient will need to continue Xarelto as per his urgent care family [facility] physician--- there is controversy regarding use of blood thinners for phlebitis so I would defer to their collective judgment as an outpatient whether to continue this or not     Hypothyroidism (acquired) continue  Synthroid by mouth at prior to admission doses    Obesity (BMI 30.0-34.9)- outpatient risk factor modification and counseling needed    Time70 minutes   Verlon Au [facility] Pager [phone]  Full CODE STATUS Discussed with girlfriend in detail at bedside  7PM-7AM, please contact night-coverage [email or web address] Password [facility] 03/24/2014, 3:07 PM

## 2014-03-24 NOTE — Progress Notes (Signed)
Subjective:    Patient ID: Russell [name], male    DOB: [birth year], 44 y.o.   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 He is presenting with nausea and vomiting x 2 days. He is unable to keep down fluids or solids. Tried to take pepto-bismol and zofran but couldn't keep it down. Has felt feverish but no fever. Having hot flashes and chills. Having some generalized abdominal pain. Denies diarrhea, urinary symptoms, CP, back pain, respiratory symptoms. He does not smoke, drink alcohol or use recreational drugs. He has not taken any of his medications in 2 days and not using insulin. Hasn't taken BG at home. Did not eat anything out of the ordinary before this started. No recent travel. No sick contacts. Pt does have a rash on his bilateral LEs that started 2 weeks ago. Was erythematous. Now brown in color. No pain or itching. No illness when the rash began.  Review of Systems  Constitutional: Positive for chills. Negative for fever.  HENT: Negative.   Eyes: Negative for visual disturbance.  Respiratory: Negative for cough.   Cardiovascular: Positive for leg swelling (improving on lasix and xarelto). Negative for chest pain and palpitations.  Gastrointestinal: Positive for nausea, vomiting and abdominal pain. Negative for diarrhea.  Genitourinary: Negative for dysuria and flank pain.  Musculoskeletal: Negative for arthralgias.  Skin: Positive for rash.  Neurological: Negative for dizziness, numbness and headaches.  Psychiatric/Behavioral: Negative for agitation.   Patient Active Problem List   Diagnosis Date Noted  . Anxiety state, unspecified 03/12/2013  . Type 2 diabetes mellitus 12/19/2011  . Hyperlipidemia 12/19/2011  . Varicose veins 12/19/2011  . Back pain, chronic 12/19/2011   Prior to Admission medications   Medication Sig Start Date End Date Taking?  Authorizing Provider  albuterol (PROVENTIL HFA;VENTOLIN HFA) 108 (90 BASE) MCG/ACT inhaler Inhale 2 puffs into the lungs every 6 (six) hours as needed for wheezing or shortness of breath. 10/22/13  Yes [name], MD  ALPRAZolam Duanne Moron) 0.5 MG tablet TAKE 1 TABLET TWICE DAILY AS NEEDED FOR SLEEP 09/08/13  Yes [name], MD  Blood Glucose Monitoring Suppl (ONETOUCH VERIO IQ SYSTEM) W/DEVICE KIT 1 each by Does not apply route. 06/02/13  Yes Historical Provider, MD  cloNIDine (CATAPRES - DOSED IN MG/24 HR) 0.1 mg/24hr patch Place 1 patch (0.1 mg total) onto the skin once a week. 03/13/14  Yes [name], MD  cyclobenzaprine (FLEXERIL) 10 MG tablet Take 1 tablet (10 mg total) by mouth 3 (three) times daily as needed for muscle spasms. 12/28/13  Yes Thao P Le, DO  furosemide (LASIX) 40 MG tablet Take 1 tablet (40 mg total) by mouth daily. 03/13/14  Yes [name], MD  gabapentin (NEURONTIN) 600 MG tablet Take 600 mg by mouth 4 (four) times daily.  12/19/11  Yes [name], MD  glucose blood [facility] VERIO) test strip 7-8 times per day 06/02/13 06/02/14 Yes Historical Provider, MD  HYDROcodone-acetaminophen (NORCO) 5-325 MG per tablet Take 1 tablet by mouth every 6 (six) hours as needed. 12/28/13  Yes Thao P Le, DO  ibuprofen (ADVIL,MOTRIN) 800 MG tablet Take 1 tablet (800 mg total) by mouth every 8 (eight) hours as needed. 01/28/13  Yes [name], MD  insulin glargine (LANTUS) 100 UNIT/ML injection Inject 60 Units into the skin 2 (two) times daily.    Yes  Historical Provider, MD  Insulin Glulisine (APIDRA SOLOSTAR) 100 UNIT/ML Solostar Pen Inject 15 Units into the skin. 06/19/13  Yes Historical Provider, MD  ketoconazole (NIZORAL) 2 % cream Apply 1 application topically daily. 02/01/14  Yes [name], MD  levothyroxine (SYNTHROID, LEVOTHROID) 50 MCG tablet Take 1 tablet (50 mcg total) by mouth daily. 05/02/13  Yes [name], MD  lisinopril (PRINIVIL,ZESTRIL) 20 MG tablet Take  1 tablet (20 mg total) by mouth daily. 03/13/14  Yes [name], MD  metFORMIN (GLUCOPHAGE) 1000 MG tablet Take 1,000 mg by mouth 2 (two) times daily with a meal.  06/02/13 06/02/14 Yes Historical Provider, MD  mometasone (ASMANEX 60 METERED DOSES) 220 MCG/INH inhaler Inhale 2 puffs into the lungs daily. 01/27/14  Yes [name], MD  naproxen (NAPROSYN) 500 MG tablet Take 1 tablet (500 mg total) by mouth 2 (two) times daily with a meal. 02/01/14  Yes [name], MD  rivaroxaban (XARELTO) 20 MG TABS tablet Take 1 tablet (20 mg total) by mouth daily with supper. 03/13/14  Yes [name], MD  Testosterone (ANDROGEL) 20.25 MG/1.25GM (1.62%) GEL Place 1 applicator onto the skin daily. 02/01/14  Yes [name], MD   No Known Allergies  Patient's social and family history were reviewed.     Objective:   Physical Exam  Constitutional: He is oriented to person, place, and time. He appears well-developed and well-nourished. He appears ill.  HENT:  Head: Normocephalic and atraumatic.  Right Ear: Hearing normal.  Left Ear: Hearing normal.  Nose: Nose normal.  Eyes: Conjunctivae and lids are normal. Right eye exhibits no discharge. Left eye exhibits no discharge. No scleral icterus.  Cardiovascular: Regular rhythm, normal heart sounds and normal pulses.  Tachycardia present.   Pulmonary/Chest: Effort normal and breath sounds normal. No respiratory distress. He has no wheezes. He has no rhonchi. He has no rales.  Abdominal: Soft. Normal appearance and bowel sounds are normal. There is generalized tenderness. There is no CVA tenderness.  Musculoskeletal: Normal range of motion.  Neurological: He is alert and oriented to person, place, and time.  Skin: Skin is warm, dry and intact. Rash (bilateral LE with light brown 0.25 cm annular lesions, nonblanching) noted.  Psychiatric: He has a normal mood and affect. His speech is normal and behavior is normal. Thought content normal.   BP  110/84 mmHg  Pulse 116  Temp(Src) 97.5 F (36.4 C)  Resp 16  Ht 6' (1.829 m)  Wt 242 lb (109.77 kg)  BMI 32.81 kg/m2  SpO2 98%  Results for orders placed or performed in visit on 03/24/14  POCT CBC  Result Value Ref Range   WBC 16.5 (A) 4.6 - 10.2 K/uL   Lymph, poc 0.9 0.6 - 3.4   POC LYMPH PERCENT 5.2 (A) 10 - 50 %L   MID (cbc) 0.5 0 - 0.9   POC MID % 3.1 0 - 12 %M   POC Granulocyte 15.1 (A) 2 - 6.9   Granulocyte percent 91.7 (A) 37 - 80 %G   RBC 5.89 4.69 - 6.13 M/uL   Hemoglobin 16.9 14.1 - 18.1 g/dL   HCT, POC 50.9 43.5 - 53.7 %   MCV 86.5 80 - 97 fL   MCH, POC 28.8 27 - 31.2 pg   MCHC 33.3 31.8 - 35.4 g/dL   RDW, POC 13.3 %   Platelet Count, POC 301 142 - 424 K/uL   MPV 8.8 0 - 99.8 fL  POCT glucose (manual entry)  Result Value  Ref Range   POC Glucose HHH (>444) 70 - 99 mg/dl   IV started at 11:30 am with zofran 4 mg given through IV  EKG: ST changes from prior EKG 10 months ago. Non-specific ST changes    Assessment & Plan:  1. Non-intractable vomiting with nausea, vomiting of unspecified type 2. Elevated WBC count 3. Elevated glucose 4. Generalized abdominal pain Sent via EMS to [facility] at 12:10 pm. Rule out hyperosmolar state vs. Sepsis vs cardiac cause. Lipase and CMP pending. - POCT CBC - POCT glucose (manual entry) - Comprehensive metabolic panel - Lipase - EKG 12-Lead - ondansetron (ZOFRAN) injection 2 mg; Inject 1 mL (2 mg total) into the vein once.    Benjaman Pott Drenda Freeze, MHS [facility]  03/24/2014

## 2014-03-24 NOTE — ED Notes (Signed)
Patient is will xray I will collect blood when patient return.

## 2014-03-24 NOTE — Progress Notes (Signed)
Utilization Review completed.  Brees Hounshell RN CM  

## 2014-03-25 DIAGNOSIS — I10 Essential (primary) hypertension: Secondary | ICD-10-CM

## 2014-03-25 DIAGNOSIS — R112 Nausea with vomiting, unspecified: Secondary | ICD-10-CM

## 2014-03-25 DIAGNOSIS — N179 Acute kidney failure, unspecified: Secondary | ICD-10-CM

## 2014-03-25 LAB — BASIC METABOLIC PANEL
Anion gap: 8 (ref 5–15)
Anion gap: 8 (ref 5–15)
BUN: 36 mg/dL — AB (ref 6–23)
BUN: 27 mg/dL — AB (ref 6–23)
CHLORIDE: 113 mmol/L — AB (ref 96–112)
CO2: 27 mmol/L (ref 19–32)
CO2: 21 mmol/L (ref 19–32)
CREATININE: 1.49 mg/dL — AB (ref 0.50–1.35)
Calcium: 8.5 mg/dL (ref 8.4–10.5)
Calcium: 6.9 mg/dL — ABNORMAL LOW (ref 8.4–10.5)
Chloride: 109 mmol/L (ref 96–112)
Creatinine, Ser: 1.05 mg/dL (ref 0.50–1.35)
GFR calc Af Amer: 65 mL/min — ABNORMAL LOW (ref >90)
GFR calc Af Amer: >90 mL/min (ref >90)
GFR calc non Af Amer: 56 mL/min — ABNORMAL LOW (ref >90)
GFR calc non Af Amer: 85 mL/min — ABNORMAL LOW (ref >90)
Glucose, Bld: 129 mg/dL — ABNORMAL HIGH (ref 70–99)
Glucose, Bld: 187 mg/dL — ABNORMAL HIGH (ref 70–99)
POTASSIUM: 3.9 mmol/L (ref 3.5–5.1)
POTASSIUM: 3 mmol/L — AB (ref 3.5–5.1)
Sodium: 144 mmol/L (ref 135–145)
Sodium: 142 mmol/L (ref 135–145)

## 2014-03-25 LAB — GLUCOSE, CAPILLARY
GLUCOSE-CAPILLARY: 143 mg/dL — AB (ref 70–99)
GLUCOSE-CAPILLARY: 145 mg/dL — AB (ref 70–99)
GLUCOSE-CAPILLARY: 173 mg/dL — AB (ref 70–99)
GLUCOSE-CAPILLARY: 176 mg/dL — AB (ref 70–99)
GLUCOSE-CAPILLARY: 184 mg/dL — AB (ref 70–99)
GLUCOSE-CAPILLARY: 194 mg/dL — AB (ref 70–99)
GLUCOSE-CAPILLARY: 238 mg/dL — AB (ref 70–99)
Glucose-Capillary: 169 mg/dL — ABNORMAL HIGH (ref 70–99)
Glucose-Capillary: 97 mg/dL (ref 70–99)
Glucose-Capillary: 231 mg/dL — ABNORMAL HIGH (ref 70–99)
Glucose-Capillary: 183 mg/dL — ABNORMAL HIGH (ref 70–99)

## 2014-03-25 MED ORDER — INSULIN GLARGINE 100 UNIT/ML ~~LOC~~ SOLN: 65.0000 [IU] | Freq: Every day | SUBCUTANEOUS | Status: DC

## 2014-03-25 MED ORDER — INSULIN GLARGINE 100 UNIT/ML ~~LOC~~ SOLN
65.0000 [IU] | Freq: Every day | SUBCUTANEOUS | Status: DC
  Administered 2014-03-25: 65 [IU] via SUBCUTANEOUS
  Filled 2014-03-25: qty 0.65

## 2014-03-25 MED ORDER — SODIUM CHLORIDE 0.9 % IV SOLN
INTRAVENOUS | Status: DC
  Administered 2014-03-25: 500 mL via INTRAVENOUS
  Administered 2014-03-26: 06:00:00 via INTRAVENOUS
  Administered 2014-03-29: 500 mL via INTRAVENOUS

## 2014-03-25 MED ORDER — INSULIN GLARGINE 100 UNIT/ML ~~LOC~~ SOLN
60.0000 [IU] | Freq: Every day | SUBCUTANEOUS | Status: DC
Start: 2014-03-26 — End: 2014-03-26
  Filled 2014-03-25: qty 0.6

## 2014-03-25 MED ORDER — POTASSIUM CHLORIDE CRYS ER 20 MEQ PO TBCR
40.0000 meq | EXTENDED_RELEASE_TABLET | Freq: Once | ORAL | Status: DC
  Filled 2014-03-25: qty 2

## 2014-03-25 MED ORDER — PHENOL 1.4 % MT LIQD
1.0000 | OROMUCOSAL | Status: DC | PRN
  Administered 2014-03-26: 1 via OROMUCOSAL
  Filled 2014-03-25 (×2): qty 177

## 2014-03-25 MED ORDER — LISINOPRIL 20 MG PO TABS
20.0000 mg | ORAL_TABLET | Freq: Every day | ORAL | Status: DC
  Administered 2014-03-25 – 2014-03-29 (×5): 20 mg via ORAL
  Filled 2014-03-25 (×2): qty 1
  Filled 2014-03-25: qty 2
  Filled 2014-03-25 (×2): qty 1

## 2014-03-25 MED ORDER — INSULIN ASPART 100 UNIT/ML ~~LOC~~ SOLN: 8.0000 [IU] | Freq: Three times a day (TID) | SUBCUTANEOUS | Status: DC

## 2014-03-25 MED ORDER — GI COCKTAIL ~~LOC~~
30.0000 mL | Freq: Three times a day (TID) | ORAL | Status: DC | PRN
  Administered 2014-03-25: 30 mL via ORAL
  Filled 2014-03-25 (×2): qty 30

## 2014-03-25 MED ORDER — POTASSIUM CHLORIDE 10 MEQ/100ML IV SOLN
10.0000 meq | INTRAVENOUS | Status: AC
  Administered 2014-03-25 – 2014-03-26 (×4): 10 meq via INTRAVENOUS
  Filled 2014-03-25 (×3): qty 100

## 2014-03-25 MED ORDER — INSULIN GLARGINE 100 UNIT/ML ~~LOC~~ SOLN: 60.0000 [IU] | Freq: Two times a day (BID) | SUBCUTANEOUS | Status: DC

## 2014-03-25 MED ORDER — INSULIN ASPART 100 UNIT/ML ~~LOC~~ SOLN
0.0000 [IU] | Freq: Three times a day (TID) | SUBCUTANEOUS | Status: DC
  Administered 2014-03-25: 4 [IU] via SUBCUTANEOUS
  Administered 2014-03-25 – 2014-03-26 (×3): 7 [IU] via SUBCUTANEOUS
  Administered 2014-03-26: 4 [IU] via SUBCUTANEOUS
  Administered 2014-03-26: 7 [IU] via SUBCUTANEOUS
  Administered 2014-03-27 – 2014-03-28 (×3): 3 [IU] via SUBCUTANEOUS

## 2014-03-25 MED ORDER — POTASSIUM CHLORIDE 10 MEQ/100ML IV SOLN
10.0000 meq | INTRAVENOUS | Status: AC
  Administered 2014-03-26 (×2): 10 meq via INTRAVENOUS
  Filled 2014-03-25: qty 100

## 2014-03-25 MED ORDER — INSULIN ASPART 100 UNIT/ML ~~LOC~~ SOLN: 11.0000 [IU] | Freq: Three times a day (TID) | SUBCUTANEOUS | Status: DC

## 2014-03-25 NOTE — Progress Notes (Signed)
   03/25/14 1600  Vitals  BP (!) 182/88 mmHg  MAP (mmHg) 121  Pulse Rate 94  ECG Heart Rate 94  Resp 19  Oxygen Therapy  SpO2 96 %  labetalol 10 MG GIVEN FOR ELEVATED B/P

## 2014-03-25 NOTE — Progress Notes (Signed)
Inpatient Diabetes Program Recommendations  AACE/ADA: New Consensus Statement on Inpatient Glycemic Control (2013)  Target Ranges:  Prepandial:   less than 140 mg/dL      Peak postprandial:   less than 180 mg/dL (1-2 hours)      Critically ill patients:  140 - 180 mg/dL     Results for [name], Ryu B (MRN [id number]) as of 03/25/2014 13:16  Ref. Range 03/24/2014 13:36  Sodium Latest Range: 135-145 mmol/L 137  Potassium Latest Range: 3.5-5.1 mmol/L 4.4  Chloride Latest Range: 96-112 mmol/L 99  CO2 Latest Range: 19-32 mmol/L 22  BUN Latest Range: 6-23 mg/dL 44 (H)  Creatinine Latest Range: 0.50-1.35 mg/dL 4.091.80 (H)  Calcium Latest Range: 8.4-10.5 mg/dL 8.8  GFR calc non Af Amer Latest Range: >90 mL/min 44 (L)  GFR calc Af Amer Latest Range: >90 mL/min 52 (L)  Glucose Latest Range: 70-99 mg/dL 811583 (HH)  Anion gap Latest Range: 5-15  16 (H)     Chief Complaint: DKA  History: DM, HTN   Home DM Meds: Per patient and his fiancee- Lantus 60 units daily              Humalog 1:5 grams carbohydrates              Humalog 1:10 mg/dl above target CBG of 914130 mg/dl   Current DM Orders: Lantus 60 units daily    Novolog Resistant SSI tid    Novolog 8 units tidwc    **Spoke with patient and his fiancee this morning.  Patient was very sleepy and was having severe nausea but was able to answer questions.  Per patient and his fiancee, patient takes Lantus 60 units once daily along with Humalog for food intake and high blood sugars (see above).  Patient stated he was unable to take insulins at home b/c he was too sick and his fiancee stated she did not know whether she should have given pt his insulin.  Discussed with patient and his fiancee the importance of still taking Lantus insulin when sick and encouraged patient and fiancee to check CBGs more often when sick and still use patient's Humalog for elevated blood sugars.  Showed patient's fiancee how correction factor works (current CBG minus  Target CBG divided by correction factor of 10.  **Patient stated interest in switching Endocrinology practices.  Was unhappy with Dr. Everardo AllEllison with [facility] and switched to Dr. Crista CurbMatthew Levy with [facility] in [place].  Patient has interest in switching to Dr. Talmage CoinJeffrey Kerr with [facility] since he lives in [place].  Encouraged patient to follow-up with the Endocrinologist of his choice shortly after d/c.  Patient agreeable.  **Patient and fiancee did not have any further questions for me.  Called Dr. Elvera LennoxGherghe to alert Dr. Elvera LennoxGherghe to verified home insulins.     Will follow Ambrose FinlandJeannine Johnston Amadea Keagy RN, MSN, CDE Diabetes Coordinator Inpatient Diabetes Program Team Pager: [phone] (8a-5p)

## 2014-03-25 NOTE — Progress Notes (Signed)
PROGRESS NOTE  Russell [name]:[id number] DOB: 15-Nov-1970 DOA: 03/24/2014 PCP: [name], MD  HPI: 44 year old male known poorly controlled diabetic type II diagnosed in 2010 with complications neuropathy/diabetic macular degeneration, prior olecranon bursitis/gout, poorly controlled HTN, recent diagnosis? Right lower extremity cellulitis/bilateral DVT/superficial phlebitis, "asthma" [facility].m. with nausea vomiting, found to be in DKA.   Subjective / 24 H Interval events - still with nausea this morning, emesis overnight but nothing in the morning - sore throat from all the vomiting  Assessment/Plan: Active Problems:   Type 2 diabetes mellitus-diagnosed ~2010   Hyperlipidemia   Back pain, chronic   Vomiting   DKA, type 2, not at goal   Secondary DM with DKA-AG=16, BIcarb Nl   Type 2 diabetes mellitus with lactic acidosis without coma   DVT (deep venous thrombosis), H/o 01/2014-on Xarelto   Hypothyroidism (acquired)   Obesity (BMI 30.0-34.9)   DKA  - initial anion gap of 16 on admission, improved after insulin drip and IV fluids - off of insulin drom 3/23 4 am, back on home Lantus of 60 BID, Lispro 8 U TID plus resistant SSI  DM2 - with complications with neuropathy, macular degeneration, will obtain A1C to determine control - allow carb modified diet - seen by Dr. Everardo All in the past, now seeing Dr. Crista Curb  AKI - multifactorial, NSAID use, DM, Metformin, Lasix, Lisinopril - hold NSAIDs, Metformin, Lasix - improving with fluids  HTN  - quite hypertensive this morning, continue clonidine, resume Lisinopril,  - blood pressure in the 150-160s/100-110s in the outpatient setting as well  Sinus tachycardia - seen in office visits as well, stable  DVT  - recently diagnosed in January - continue Xarelto   Back pain, chronic  Hypothyroidism - continue Synthroid  Obesity (BMI 30.0-34.9) - outpatient risk factor  modification and counseling needed    Diet: Diet Carb Modified Fluids: NS 75 cc/h DVT Prophylaxis: On Xarelto  Code Status: Full Code Family Communication: d/w mother bedside  Disposition Plan: remain in SDU, floor transfer later of stable  Consultants:  None   Procedures:  None    Antibiotics  Anti-infectives    None      Studies  Dg Chest 2 View  03/24/2014   CLINICAL DATA:  44 year old male with nausea vomiting for 2 days. Hyperglycemia. Leukocytosis. Initial encounter.  EXAM: CHEST  2 VIEW  COMPARISON:  12/28/2013.  FINDINGS: Semi upright AP and lateral views of the chest at 1332 hours. Lower lung volumes. Normal cardiac size and mediastinal contours. Visualized tracheal air column is within normal limits. No pneumothorax, pulmonary edema, pleural effusion or consolidation. Crowding of markings at the lung bases. No acute osseous abnormality identified.  IMPRESSION: Low lung volumes with mild bibasilar opacity most resembling atelectasis.   Electronically Signed   By: Odessa Fleming M.D.   On: 03/24/2014 14:09   Objective  Filed Vitals:   03/25/14 0600 03/25/14 0740 03/25/14 0800 03/25/14 1000  BP: 189/93  199/104 194/98  Pulse: 97  101 97  Temp:  97.9 F (36.6 C)    TempSrc:  Oral    Resp: Height:      Weight:      SpO2: 96%  96% 95%    Intake/Output Summary (Last 24 hours) at 03/25/14 1142 Last data filed at 03/25/14 0700  Gross per 24 hour  Intake 2302.5 ml  Output   2950 ml  Net -647.5 ml  Filed Weights   03/24/14 1600  Weight: 111 kg (244 lb 11.4 oz)   Exam:  General:  NAD  HEENT: no scleral icterus, PERRL  Cardiovascular: RRR without MRG, 2+ peripheral pulses, no edema  Respiratory: CTA biL, good air movement, no wheezing, no crackles, no rales  Abdomen: soft, non tender, BS +, no guarding  MSK/Extremities: no clubbing/cyanosis, no joint swelling  Skin: no rashes  Neuro: non focal  Data Reviewed: Basic Metabolic  Panel:  Recent Labs Lab 03/24/14 1138 03/24/14 1336 03/24/14 1622 03/25/14 0206  NA 134* 137 139 144  K 4.9 4.4 3.8 3.9  CL 92* 99 105 109  CO2 19 22 23 27   GLUCOSE 695* 583* 424* 129*  BUN 40* 44* 42* 36*  CREATININE 1.80* 1.80* 1.63* 1.49*  CALCIUM 9.5 8.8 8.3* 8.5   Liver Function Tests:  Recent Labs Lab 03/24/14 1138 03/24/14 1336  AST 36 39*  ALT 39 41  ALKPHOS 101 99  BILITOT 0.6 1.1  PROT 6.2 6.3  ALBUMIN 3.6 3.4*    Recent Labs Lab 03/24/14 1138 03/24/14 1336  LIPASE <10 16    CBC:  Recent Labs Lab 03/24/14 1142 03/24/14 1336 03/24/14 1622  WBC 16.5* 15.6* 15.9*  HGB 16.9 15.8 15.2  HCT 50.9 44.9 42.5  MCV 86.5 83.9 83.7  PLT  --  288 283   CBG:  Recent Labs Lab 03/25/14 0149 03/25/14 0301 03/25/14 0513 03/25/14 0653 03/25/14 0757  GLUCAP 145* 97 173* 194* 231*    Recent Results (from the past 240 hour(s))  MRSA PCR Screening     Status: None   Collection Time: 03/24/14  6:00 PM  Result Value Ref Range Status   MRSA by PCR NEGATIVE NEGATIVE Final    Comment:        The GeneXpert MRSA Assay (FDA approved for NASAL specimens only), is one component of a comprehensive MRSA colonization surveillance program. It is not intended to diagnose MRSA infection nor to guide or monitor treatment for MRSA infections.      Scheduled Meds: . cloNIDine  0.1 mg Transdermal Weekly  .  HYDROmorphone (DILAUDID) injection  1 mg Intravenous Once  . insulin aspart  0-20 Units Subcutaneous TID WC  . insulin aspart  8 Units Subcutaneous TID WC  . insulin glargine  65 Units Subcutaneous QHS  . levothyroxine  50 mcg Oral QAC breakfast  . lisinopril  20 mg Oral Daily  . rivaroxaban  20 mg Oral Q supper   Continuous Infusions: . sodium chloride 75 mL/hr at 03/25/14 0700    Pamella Pertostin Gherghe, MD [facility] Pager [phone]. If 7 PM - 7 AM, please contact night-coverage at [email or web address], password [email or web address] 03/25/2014, 11:42 AM  LOS: 1 day

## 2014-03-26 DIAGNOSIS — N189 Chronic kidney disease, unspecified: Secondary | ICD-10-CM

## 2014-03-26 DIAGNOSIS — E1122 Type 2 diabetes mellitus with diabetic chronic kidney disease: Secondary | ICD-10-CM

## 2014-03-26 LAB — COMPREHENSIVE METABOLIC PANEL
ALBUMIN: 2.7 g/dL — AB (ref 3.5–5.2)
ALT: 43 U/L (ref 0–53)
AST: 50 U/L — AB (ref 0–37)
Alkaline Phosphatase: 86 U/L (ref 39–117)
Anion gap: 12 (ref 5–15)
BUN: 24 mg/dL — ABNORMAL HIGH (ref 6–23)
CHLORIDE: 103 mmol/L (ref 96–112)
CO2: 24 mmol/L (ref 19–32)
CREATININE: 1.1 mg/dL (ref 0.50–1.35)
Calcium: 8 mg/dL — ABNORMAL LOW (ref 8.4–10.5)
GFR calc Af Amer: >90 mL/min (ref >90)
GFR calc non Af Amer: 81 mL/min — ABNORMAL LOW (ref >90)
Glucose, Bld: 224 mg/dL — ABNORMAL HIGH (ref 70–99)
POTASSIUM: 3.8 mmol/L (ref 3.5–5.1)
Sodium: 139 mmol/L (ref 135–145)
TOTAL PROTEIN: 5.7 g/dL — AB (ref 6.0–8.3)
Total Bilirubin: 0.8 mg/dL (ref 0.3–1.2)

## 2014-03-26 LAB — CBC
HCT: 45.1 % (ref 39.0–52.0)
HEMOGLOBIN: 15.6 g/dL (ref 13.0–17.0)
MCH: 29.4 pg (ref 26.0–34.0)
MCHC: 34.6 g/dL (ref 30.0–36.0)
MCV: 85.1 fL (ref 78.0–100.0)
Platelets: 257 10*3/uL (ref 150–400)
RBC: 5.3 MIL/uL (ref 4.22–5.81)
RDW: 12.9 % (ref 11.5–15.5)
WBC: 15.2 10*3/uL — AB (ref 4.0–10.5)

## 2014-03-26 LAB — GLUCOSE, CAPILLARY
GLUCOSE-CAPILLARY: 203 mg/dL — AB (ref 70–99)
Glucose-Capillary: 206 mg/dL — ABNORMAL HIGH (ref 70–99)
Glucose-Capillary: 153 mg/dL — ABNORMAL HIGH (ref 70–99)

## 2014-03-26 LAB — HEMOGLOBIN A1C
HEMOGLOBIN A1C: 10.6 % — AB (ref 4.8–5.6)
Mean Plasma Glucose: 258 mg/dL

## 2014-03-26 MED ORDER — METOCLOPRAMIDE HCL 5 MG/ML IJ SOLN
5.0000 mg | Freq: Three times a day (TID) | INTRAMUSCULAR | Status: DC
  Administered 2014-03-26 – 2014-03-29 (×10): 5 mg via INTRAVENOUS
  Filled 2014-03-26 (×2): qty 2
  Filled 2014-03-26 (×2): qty 1
  Filled 2014-03-26: qty 2
  Filled 2014-03-26: qty 1
  Filled 2014-03-26: qty 2
  Filled 2014-03-26 (×6): qty 1

## 2014-03-26 MED ORDER — AMLODIPINE BESYLATE 5 MG PO TABS
5.0000 mg | ORAL_TABLET | Freq: Every day | ORAL | Status: DC
  Administered 2014-03-26 – 2014-03-29 (×4): 5 mg via ORAL
  Filled 2014-03-26 (×4): qty 1

## 2014-03-26 MED ORDER — ALUM & MAG HYDROXIDE-SIMETH 200-200-20 MG/5ML PO SUSP
15.0000 mL | ORAL | Status: DC | PRN
  Filled 2014-03-26: qty 30

## 2014-03-26 MED ORDER — MENTHOL 3 MG MT LOZG
1.0000 | LOZENGE | OROMUCOSAL | Status: DC | PRN
  Administered 2014-03-26: 3 mg via ORAL
  Filled 2014-03-26 (×2): qty 9

## 2014-03-26 MED ORDER — INSULIN GLARGINE 100 UNIT/ML ~~LOC~~ SOLN
60.0000 [IU] | Freq: Every day | SUBCUTANEOUS | Status: DC
  Administered 2014-03-26 – 2014-03-28 (×3): 60 [IU] via SUBCUTANEOUS
  Filled 2014-03-26 (×3): qty 0.6

## 2014-03-26 NOTE — Progress Notes (Signed)
PROGRESS NOTE  QUIN MATHENIA JXB:[id number] DOB: Apr 16, 1970 DOA: 03/24/2014 PCP: [name], MD  HPI: 44 year old male known poorly controlled diabetic type II diagnosed in 2010 with complications neuropathy/diabetic macular degeneration, prior olecranon bursitis/gout, poorly controlled HTN, recent diagnosis? Right lower extremity cellulitis/bilateral DVT/superficial phlebitis, [facility]asth[facility]" [facility]a.m. with nausea vomiting, found to be in DKA.   Subjective / 24 H Interval events - ongoing nausea, sore throat.  - overall better, eating some  Assessment/Plan: Active Problems:   Type 2 diabetes mellitus-diagnosed ~2010   Hyperlipidemia   Back pain, chronic   Vomiting   DKA, type 2, not at goal   Secondary DM with DKA-AG=16, BIcarb Nl   Type 2 diabetes mellitus with lactic acidosis without coma   DVT (deep venous thrombosis), H/o 01/2014-on Xarelto   Hypothyroidism (acquired)   Obesity (BMI 30.0-34.9)   Nausea and vomiting - likely gastroparesis, patient admits today as to some ongoing symptoms of early satiety and nausea with larger meals for couple of months. A1C at 10.3 showing poor control - continue supportive treatment with antiemetics.  - start reglan scheduled  DM2 - poorly controlled, with complications with neuropathy, macular degeneration - allow carb modified diet - seen by Dr. Everardo All in the past, now seeing Dr. Crista Curb  DKA  - initial anion gap of 16 on admission, improved after insulin drip and IV fluids - off of insulin drom 3/23 4 am, back on home Lantus of 60 daily, Lispro 8 U TID plus resistant SSI  AKI - multifactorial, NSAID use, DM, Metformin, Lasix, Lisinopril - hold NSAIDs, Metformin, Lasix - improving with fluids  HTN  - quite hypertensive this morning, continue clonidine, resumed Lisinopril, added Norvasc today - blood pressure in the 150-160s/100-110s in the outpatient setting as well  Sinus  tachycardia - seen in office visits as well, stable  DVT  - recently diagnosed in January - continue Xarelto   Back pain, chronic  Hypothyroidism - continue Synthroid  Obesity (BMI 30.0-34.9) - outpatient risk factor modification and counseling needed    Diet: Diet Carb Modified Fluids: NS 50 cc/h DVT Prophylaxis: On Xarelto  Code Status: Full Code Family Communication: d/w mother bedside  Disposition Plan: home when ready   Consultants:  None   Procedures:  None    Antibiotics  Anti-infectives    None      Studies  Dg Chest 2 View  03/24/2014   CLINICAL DATA:  44 year old male with nausea vomiting for 2 days. Hyperglycemia. Leukocytosis. Initial encounter.  EXAM: CHEST  2 VIEW  COMPARISON:  12/28/2013.  FINDINGS: Semi upright AP and lateral views of the chest at 1332 hours. Lower lung volumes. Normal cardiac size and mediastinal contours. Visualized tracheal air column is within normal limits. No pneumothorax, pulmonary edema, pleural effusion or consolidation. Crowding of markings at the lung bases. No acute osseous abnormality identified.  IMPRESSION: Low lung volumes with mild bibasilar opacity most resembling atelectasis.   Electronically Signed   By: Odessa Fleming M.D.   On: 03/24/2014 14:09   Objective  Filed Vitals:   03/25/14 2116 03/26/14 0604 03/26/14 0759 03/26/14 0852  BP: 166/91 199/113 182/100 168/93  Pulse: 91 104    Temp: 98.5 F (36.9 C) 98.2 F (36.8 C)    TempSrc: Oral Oral    Resp: 20 20    Height:  (1.88 m)     Weight:      SpO2: 99% 96%  Intake/Output Summary (Last 24 hours) at 03/26/14 0932 Last data filed at 03/26/14 0600  Gross per 24 hour  Intake 2042.5 ml  Output   1750 ml  Net  292.5 ml   Filed Weights   03/24/14 1600  Weight: 111 kg (244 lb 11.4 oz)   Exam:  General:  NAD  HEENT: no scleral icterus, PERRL, slightly erythematous oropharynx, no exudates  Cardiovascular: RRR without MRG, 2+ peripheral pulses,  no edema  Respiratory: CTA biL, good air movement, no wheezing, no crackles, no rales  Abdomen: soft, non tender, BS +, no guarding  MSK/Extremities: no clubbing/cyanosis, no joint swelling  Skin: no rashes  Neuro: non focal  Data Reviewed: Basic Metabolic Panel:  Recent Labs Lab 03/24/14 1336 03/24/14 1622 03/25/14 0206 03/25/14 1350 03/26/14 0545  NA 137 139 144 142 139  K 4.4 3.8 3.9 3.0* 3.8  CL 99 105 109 113* 103  CO2 22 23 27 21 24   GLUCOSE 583* 424* 129* 187* 224*  BUN 44* 42* 36* 27* 24*  CREATININE 1.80* 1.63* 1.49* 1.05 1.10  CALCIUM 8.8 8.3* 8.5 6.9* 8.0*   Liver Function Tests:  Recent Labs Lab 03/24/14 1138 03/24/14 1336 03/26/14 0545  AST 36 39* 50*  ALT 39 41 43  ALKPHOS 101 99 86  BILITOT 0.6 1.1 0.8  PROT 6.2 6.3 5.7*  ALBUMIN 3.6 3.4* 2.7*    Recent Labs Lab 03/24/14 1138 03/24/14 1336  LIPASE <10 16    CBC:  Recent Labs Lab 03/24/14 1142 03/24/14 1336 03/24/14 1622 03/26/14 0545  WBC 16.5* 15.6* 15.9* 15.2*  HGB 16.9 15.8 15.2 15.6  HCT 50.9 44.9 42.5 45.1  MCV 86.5 83.9 83.7 85.1  PLT  --  288 283 257   CBG:  Recent Labs Lab 03/25/14 0757 03/25/14 1145 03/25/14 1711 03/25/14 1836 03/25/14 2121  GLUCAP 231* 183* 176* 238* 184*    Recent Results (from the past 240 hour(s))  MRSA PCR Screening     Status: None   Collection Time: 03/24/14  6:00 PM  Result Value Ref Range Status   MRSA by PCR NEGATIVE NEGATIVE Final    Comment:        The GeneXpert MRSA Assay (FDA approved for NASAL specimens only), is one component of a comprehensive MRSA colonization surveillance program. It is not intended to diagnose MRSA infection nor to guide or monitor treatment for MRSA infections.      Scheduled Meds: . amLODipine  5 mg Oral Daily  . cloNIDine  0.1 mg Transdermal Weekly  .  HYDROmorphone (DILAUDID) injection  1 mg Intravenous Once  . insulin aspart  0-20 Units Subcutaneous TID WC  . insulin aspart  8 Units  Subcutaneous TID WC  . insulin glargine  60 Units Subcutaneous Daily  . levothyroxine  50 mcg Oral QAC breakfast  . lisinopril  20 mg Oral Daily  . metoCLOPramide (REGLAN) injection  5 mg Intravenous 3 times per day  . rivaroxaban  20 mg Oral Q supper   Continuous Infusions: . sodium chloride 75 mL/hr at 03/26/14 0617  . sodium chloride 10 mL/hr at 03/26/14 [id number]    Pamella Pertostin Jashaun Penrose, MD [facility] Pager [phone]. If 7 PM - 7 AM, please contact night-coverage at [email or web address], password [email or web address] 03/26/2014, 9:32 AM  LOS: 2 days

## 2014-03-26 NOTE — Progress Notes (Signed)
Pt having significant swelling in both hands and some swelling in face.  Pt says he noticed swelling yesterday.  MD made aware.

## 2014-03-27 ENCOUNTER — Inpatient Hospital Stay (HOSPITAL_COMMUNITY): Payer: Managed Care, Other (non HMO)

## 2014-03-27 LAB — URINALYSIS, ROUTINE W REFLEX MICROSCOPIC
BILIRUBIN URINE: NEGATIVE
Glucose, UA: 100 mg/dL — AB
KETONES UR: NEGATIVE mg/dL
Leukocytes, UA: NEGATIVE
Nitrite: NEGATIVE
Protein, ur: >300 mg/dL — AB
Specific Gravity, Urine: 1.017 (ref 1.005–1.030)
Urobilinogen, UA: 0.2 mg/dL (ref 0.0–1.0)
pH: 6 (ref 5.0–8.0)

## 2014-03-27 LAB — COMPREHENSIVE METABOLIC PANEL
ALBUMIN: 2.3 g/dL — AB (ref 3.5–5.2)
ALT: 34 U/L (ref 0–53)
AST: 32 U/L (ref 0–37)
Alkaline Phosphatase: 82 U/L (ref 39–117)
Anion gap: 8 (ref 5–15)
BUN: 23 mg/dL (ref 6–23)
CO2: 26 mmol/L (ref 19–32)
Calcium: 7.9 mg/dL — ABNORMAL LOW (ref 8.4–10.5)
Chloride: 102 mmol/L (ref 96–112)
Creatinine, Ser: 1.19 mg/dL (ref 0.50–1.35)
GFR calc Af Amer: 85 mL/min — ABNORMAL LOW (ref >90)
GFR calc non Af Amer: 73 mL/min — ABNORMAL LOW (ref >90)
GLUCOSE: 137 mg/dL — AB (ref 70–99)
POTASSIUM: 3.8 mmol/L (ref 3.5–5.1)
SODIUM: 136 mmol/L (ref 135–145)
TOTAL PROTEIN: 5.4 g/dL — AB (ref 6.0–8.3)
Total Bilirubin: 0.8 mg/dL (ref 0.3–1.2)

## 2014-03-27 LAB — URINE MICROSCOPIC-ADD ON

## 2014-03-27 LAB — GLUCOSE, CAPILLARY
GLUCOSE-CAPILLARY: 94 mg/dL (ref 70–99)
GLUCOSE-CAPILLARY: 133 mg/dL — AB (ref 70–99)
GLUCOSE-CAPILLARY: 119 mg/dL — AB (ref 70–99)
GLUCOSE-CAPILLARY: 107 mg/dL — AB (ref 70–99)
Glucose-Capillary: 82 mg/dL (ref 70–99)

## 2014-03-27 MED ORDER — PANTOPRAZOLE SODIUM 40 MG IV SOLR
40.0000 mg | Freq: Two times a day (BID) | INTRAVENOUS | Status: DC
  Administered 2014-03-27 – 2014-03-29 (×5): 40 mg via INTRAVENOUS
  Filled 2014-03-27 (×5): qty 40

## 2014-03-27 MED ORDER — ONDANSETRON HCL 4 MG/2ML IJ SOLN
4.0000 mg | Freq: Three times a day (TID) | INTRAMUSCULAR | Status: DC
  Administered 2014-03-27 – 2014-03-29 (×6): 4 mg via INTRAVENOUS
  Filled 2014-03-27 (×6): qty 2

## 2014-03-27 MED ORDER — HYDRALAZINE HCL 20 MG/ML IJ SOLN
10.0000 mg | Freq: Once | INTRAMUSCULAR | Status: DC
  Filled 2014-03-27: qty 1

## 2014-03-27 NOTE — Progress Notes (Signed)
PROGRESS NOTE  Russell [name] [id number] DOB: December 11, 1970 DOA: 03/24/2014 PCP: Elvina SidleLAUENSTEIN,KURT, MD  HPI: 44 year old male known poorly controlled diabetic type II diagnosed in 2010 with complications neuropathy/diabetic macular degeneration, prior olecranon bursitis/gout, poorly controlled HTN, recent diagnosis? Right lower extremity cellulitis/bilateral DVT/superficial phlebitis, [facility]th[facility]" [facility]a.m. with nausea vomiting, found to be in DKA.   Subjective / 24 H Interval events - nausea better, still intermittent   Assessment/Plan: Active Problems:   Type 2 diabetes mellitus-diagnosed ~2010   Hyperlipidemia   Back pain, chronic   Vomiting   DKA, type 2, not at goal   Secondary DM with DKA-AG=16, BIcarb Nl   Type 2 diabetes mellitus with lactic acidosis without coma   DVT (deep venous thrombosis), H/o 01/2014-on Xarelto   Hypothyroidism (acquired)   Obesity (BMI 30.0-34.9)   Nausea and vomiting - likely gastroparesis, patient admits today as to some ongoing symptoms of early satiety and nausea with larger meals for couple of months. A1C at 10.3 showing poor control - continue supportive treatment with antiemetics - reglan scheduled - start protonix - schedule zofran - repeat CMP today   DM2 - poorly controlled, with complications with neuropathy, macular degeneration - allow carb modified diet - seen by Dr. Everardo AllEllison in the past, now seeing Dr. Crista CurbMatthew Levy - CBGs improved  DKA  - initial anion gap of 16 on admission, improved after insulin drip and IV fluids - off of insulin drom 3/23 4 am, back on home Lantus of 60 daily, Lispro 8 U TID plus resistant SSI  AKI - multifactorial, NSAID use, DM, Metformin, Lasix, Lisinopril - hold NSAIDs, Metformin, Lasix - improving with fluids  HTN  - quite hypertensive still, continue clonidine, resumed Lisinopril, added Norvasc today - blood pressure in the 150-160s/100-110s in the  outpatient setting as well - blood pressure similar to outpatient values today   Sinus tachycardia - seen in office visits as well, stable, no events on telemetry - d/c telemetry   DVT  - recently diagnosed in January - continue Xarelto   Back pain, chronic  Hypothyroidism - continue Synthroid  Obesity (BMI 30.0-34.9) - outpatient risk factor modification and counseling needed    Diet: Diet Carb Modified Fluids: none DVT Prophylaxis: On Xarelto  Code Status: Full Code Family Communication: d/w mother bedside  Disposition Plan: home when ready   Consultants:  None   Procedures:  None    Antibiotics  Anti-infectives    None      Studies  No results found. Objective  Filed Vitals:   03/26/14 2222 03/26/14 2343 03/27/14 0520 03/27/14 0647  BP: 162/98 150/88 169/90 163/84  Pulse: 99  98   Temp:   98.1 F (36.7 C)   TempSrc:   Oral   Resp:   20   Height:      Weight:      SpO2:  97% 98%     Intake/Output Summary (Last 24 hours) at 03/27/14 0903 Last data filed at 03/27/14 0700  Gross per 24 hour  Intake   1520 ml  Output   1100 ml  Net    420 ml   Filed Weights   03/24/14 1600  Weight: 111 kg (244 lb 11.4 oz)   Exam:  General:  NAD  HEENT: no scleral icterus, PERRL, slightly erythematous oropharynx, no exudates  Cardiovascular: RRR without MRG, 2+ peripheral pulses, no edema  Respiratory: CTA biL, good air movement, no wheezing, no crackles, no rales  Abdomen: soft, non tender, BS +, no guarding  MSK/Extremities: no clubbing/cyanosis, no joint swelling  Skin: no rashes  Neuro: non focal  Data Reviewed: Basic Metabolic Panel:  Recent Labs Lab 03/24/14 1336 03/24/14 1622 03/25/14 0206 03/25/14 1350 03/26/14 0545  NA 137 139 144 142 139  K 4.4 3.8 3.9 3.0* 3.8  CL 99 105 109 113* 103  CO2 GLUCOSE 583* 424* 129* 187* 224*  BUN 44* 42* 36* 27* 24*  CREATININE 1.80* 1.63* 1.49* 1.05 1.10  CALCIUM 8.8 8.3*  8.5 6.9* 8.0*   Liver Function Tests:  Recent Labs Lab 03/24/14 1138 03/24/14 1336 03/26/14 0545  AST 36 39* 50*  ALT 39 41 43  ALKPHOS 101 99 86  BILITOT 0.6 1.1 0.8  PROT 6.2 6.3 5.7*  ALBUMIN 3.6 3.4* 2.7*    Recent Labs Lab 03/24/14 1138 03/24/14 1336  LIPASE <10 16    CBC:  Recent Labs Lab 03/24/14 1142 03/24/14 1336 03/24/14 1622 03/26/14 0545  WBC 16.5* 15.6* 15.9* 15.2*  HGB 16.9 15.8 15.2 15.6  HCT 50.9 44.9 42.5 45.1  MCV 86.5 83.9 83.7 85.1  PLT  --  288 283 257   CBG:  Recent Labs Lab 03/26/14 0731 03/26/14 1211 03/26/14 1722 03/26/14 2111 03/27/14 0830  GLUCAP 206* 203* 153* 94 133*    Recent Results (from the past 240 hour(s))  MRSA PCR Screening     Status: None   Collection Time: 03/24/14  6:00 PM  Result Value Ref Range Status   MRSA by PCR NEGATIVE NEGATIVE Final    Comment:        The GeneXpert MRSA Assay (FDA approved for NASAL specimens only), is one component of a comprehensive MRSA colonization surveillance program. It is not intended to diagnose MRSA infection nor to guide or monitor treatment for MRSA infections.      Scheduled Meds: . amLODipine  5 mg Oral Daily  . cloNIDine  0.1 mg Transdermal Weekly  .  HYDROmorphone (DILAUDID) injection  1 mg Intravenous Once  . insulin aspart  0-20 Units Subcutaneous TID WC  . insulin aspart  8 Units Subcutaneous TID WC  . insulin glargine  60 Units Subcutaneous Daily  . levothyroxine  50 mcg Oral QAC breakfast  . lisinopril  20 mg Oral Daily  . metoCLOPramide (REGLAN) injection  5 mg Intravenous 3 times per day  . ondansetron (ZOFRAN) IV  4 mg Intravenous 3 times per day  . pantoprazole (PROTONIX) IV  40 mg Intravenous Q12H  . rivaroxaban  20 mg Oral Q supper   Continuous Infusions: . sodium chloride 10 mL/hr at 03/26/14 0617    Pamella Pert, MD [facility] Pager [phone]. If 7 PM - 7 AM, please contact night-coverage at [email or web address], password  [facility] 03/27/2014, 9:03 AM  LOS: 3 days

## 2014-03-28 DIAGNOSIS — R1310 Dysphagia, unspecified: Secondary | ICD-10-CM

## 2014-03-28 LAB — GLUCOSE, CAPILLARY
GLUCOSE-CAPILLARY: 65 mg/dL — AB (ref 70–99)
GLUCOSE-CAPILLARY: 92 mg/dL (ref 70–99)
Glucose-Capillary: 128 mg/dL — ABNORMAL HIGH (ref 70–99)
Glucose-Capillary: 144 mg/dL — ABNORMAL HIGH (ref 70–99)
Glucose-Capillary: 111 mg/dL — ABNORMAL HIGH (ref 70–99)

## 2014-03-28 MED ORDER — INSULIN GLARGINE 100 UNIT/ML ~~LOC~~ SOLN
50.0000 [IU] | Freq: Every day | SUBCUTANEOUS | Status: DC
  Administered 2014-03-29: 50 [IU] via SUBCUTANEOUS
  Filled 2014-03-28: qty 0.5

## 2014-03-28 NOTE — Progress Notes (Addendum)
Hypoglycemic Event  CBG: 65  Treatment: 4oz regular coke  Symptoms: none  Follow-up CBG: Time: 1750 CBG Result: 111  Possible Reasons for Event: insulin and poor po intake d/t nausea/ esophageal burning  Comments/MD notified:    Cyndy Freezeeutsch, Naomi Castrogiovanni  Remember to initiate Hypoglycemia Order Set & complete

## 2014-03-28 NOTE — Consult Note (Signed)
[facility] Consult Note  Referring Provider: No ref. provider found Primary Care Physician:  [name], MD Primary Gastroenterologist:  Dr.  Laurel Dimmer Complaint: Nausea vomiting early satiety and dysphagia HPI: Russell [name] is an 44 y.o. white male  insulin-dependent diabetic who is admitted with DKA. He has had a lot of early satiety and a 30 pound weight loss prior to admission. He was vomiting on admission and since improvement in his acidosis has continued to have an extreme burning sensation in his chest and epigastrium and feels like he can barely eat liquids or solids. This is a new complaint just since he was hospitalized. He has never had an endoscopy in the past. He has never been diagnosed with diabetic gastroparesis.  Past Medical History  Diagnosis Date  . Thyroid disease   . Diabetes mellitus without complication   . Hypertension     Past Surgical History  Procedure Laterality Date  . Right ankle fx repair    . Eye surgery      Medications Prior to Admission  Medication Sig Dispense Refill  . albuterol (PROVENTIL HFA;VENTOLIN HFA) 108 (90 BASE) MCG/ACT inhaler Inhale 2 puffs into the lungs every 6 (six) hours as needed for wheezing or shortness of breath. 1 Inhaler 0  . ALPRAZolam (XANAX) 0.5 MG tablet TAKE 1 TABLET TWICE DAILY AS NEEDED FOR SLEEP 60 tablet 4  . Blood Glucose Monitoring Suppl (ONETOUCH VERIO IQ SYSTEM) W/DEVICE KIT 1 each by Does not apply route.    . cyclobenzaprine (FLEXERIL) 10 MG tablet Take 1 tablet (10 mg total) by mouth 3 (three) times daily as needed for muscle spasms. 30 tablet 0  . furosemide (LASIX) 40 MG tablet Take 1 tablet (40 mg total) by mouth daily. 30 tablet 3  . gabapentin (NEURONTIN) 600 MG tablet Take 600 mg by mouth 4 (four) times daily.     Marland Kitchen glucose blood (ONETOUCH VERIO) test strip 7-8 times per day    . HYDROcodone-acetaminophen (NORCO) 5-325 MG per tablet Take 1 tablet by mouth every 6 (six) hours as needed.  30 tablet 0  . ibuprofen (ADVIL,MOTRIN) 800 MG tablet Take 1 tablet (800 mg total) by mouth every 8 (eight) hours as needed. 90 tablet 11  . insulin glargine (LANTUS) 100 UNIT/ML injection Inject 60 Units into the skin 2 (two) times daily.     . Insulin Glulisine (APIDRA SOLOSTAR) 100 UNIT/ML Solostar Pen Inject 15 Units into the skin.    . Insulin Lispro, Human, 200 UNIT/ML SOPN Inject 1-50 Units into the skin daily. 1 unit per 5 grams of carbohydrates with meals/snacks plus coverage scale. Total 50 units/day    . levothyroxine (SYNTHROID, LEVOTHROID) 50 MCG tablet Take 1 tablet (50 mcg total) by mouth daily. 90 tablet 3  . lisinopril (PRINIVIL,ZESTRIL) 20 MG tablet Take 1 tablet (20 mg total) by mouth daily. 90 tablet 3  . metFORMIN (GLUCOPHAGE) 1000 MG tablet Take 1,000 mg by mouth 2 (two) times daily with a meal.     . mometasone (ASMANEX 60 METERED DOSES) 220 MCG/INH inhaler Inhale 2 puffs into the lungs daily. 1 Inhaler 12  . naproxen (NAPROSYN) 500 MG tablet Take 1 tablet (500 mg total) by mouth 2 (two) times daily with a meal. 14 tablet 0  . rivaroxaban (XARELTO) 20 MG TABS tablet Take 1 tablet (20 mg total) by mouth daily with supper. 30 tablet 1  . Testosterone (ANDROGEL) 20.25 MG/1.25GM (1.62%) GEL Place 1 applicator onto the skin daily. 1.25 g 3  .  cloNIDine (CATAPRES - DOSED IN MG/24 HR) 0.1 mg/24hr patch Place 1 patch (0.1 mg total) onto the skin once a week. 4 patch 12  . ketoconazole (NIZORAL) 2 % cream Apply 1 application topically daily. 30 g 1    Allergies: No Known Allergies  Family History  Problem Relation Age of Onset  . Obesity Mother     Patient states that family members have no other medical illnesses other than what I have described    Social History:  reports that he has never smoked. He does not have any smokeless tobacco history on file. He reports that he does not drink alcohol or use illicit drugs.  Review of Systems: negative except as above   Blood  pressure 154/92, pulse 105, temperature 98.2 F (36.8 C), temperature source Oral, resp. rate 16, height 6' 2"  (1.88 m), weight 111 kg (244 lb 11.4 oz), SpO2 97 %. Head: Normocephalic, without obvious abnormality, atraumatic Neck: no adenopathy, no carotid bruit, no JVD, supple, symmetrical, trachea midline and thyroid not enlarged, symmetric, no tenderness/mass/nodules Resp: clear to auscultation bilaterally Cardio: regular rate and rhythm, S1, S2 normal, no murmur, click, rub or gallop GI: Abdomen soft slightly distended with normoactive bowel sounds no hepatomegaly masses or guarding there is epigastric and to a lesser degree diffuse tenderness Extremities: extremities normal, atraumatic, no cyanosis or edema  Results for orders placed or performed during the hospital encounter of 03/24/14 (from the past 48 hour(s))  Glucose, capillary     Status: Abnormal   Collection Time: 03/26/14 12:11 PM  Result Value Ref Range   Glucose-Capillary 203 (H) 70 - 99 mg/dL  Glucose, capillary     Status: Abnormal   Collection Time: 03/26/14  5:22 PM  Result Value Ref Range   Glucose-Capillary 153 (H) 70 - 99 mg/dL  Glucose, capillary     Status: None   Collection Time: 03/26/14  9:11 PM  Result Value Ref Range   Glucose-Capillary 94 70 - 99 mg/dL  Glucose, capillary     Status: Abnormal   Collection Time: 03/27/14  8:30 AM  Result Value Ref Range   Glucose-Capillary 133 (H) 70 - 99 mg/dL  Comprehensive metabolic panel     Status: Abnormal   Collection Time: 03/27/14  9:55 AM  Result Value Ref Range   Sodium 136 135 - 145 mmol/L   Potassium 3.8 3.5 - 5.1 mmol/L   Chloride 102 96 - 112 mmol/L   CO2 26 19 - 32 mmol/L   Glucose, Bld 137 (H) 70 - 99 mg/dL   BUN 23 6 - 23 mg/dL   Creatinine, Ser 1.19 0.50 - 1.35 mg/dL   Calcium 7.9 (L) 8.4 - 10.5 mg/dL   Total Protein 5.4 (L) 6.0 - 8.3 g/dL   Albumin 2.3 (L) 3.5 - 5.2 g/dL   AST 32 0 - 37 U/L   ALT 34 0 - 53 U/L   Alkaline Phosphatase 82 39 -  117 U/L   Total Bilirubin 0.8 0.3 - 1.2 mg/dL   GFR calc non Af Amer 73 (L) >90 mL/min   GFR calc Af Amer 85 (L) >90 mL/min    Comment: (NOTE) The eGFR has been calculated using the CKD EPI equation. This calculation has not been validated in all clinical situations. eGFR's persistently <90 mL/min signify possible Chronic Kidney Disease.    Anion gap 8 5 - 15  Glucose, capillary     Status: Abnormal   Collection Time: 03/27/14 11:43 AM  Result Value  Ref Range   Glucose-Capillary 119 (H) 70 - 99 mg/dL  Urinalysis, Routine w reflex microscopic     Status: Abnormal   Collection Time: 03/27/14  4:12 PM  Result Value Ref Range   Color, Urine AMBER (A) YELLOW    Comment: BIOCHEMICALS MAY BE AFFECTED BY COLOR   APPearance CLEAR CLEAR   Specific Gravity, Urine 1.017 1.005 - 1.030   pH 6.0 5.0 - 8.0   Glucose, UA 100 (A) NEGATIVE mg/dL   Hgb urine dipstick MODERATE (A) NEGATIVE   Bilirubin Urine NEGATIVE NEGATIVE   Ketones, ur NEGATIVE NEGATIVE mg/dL   Protein, ur >300 (A) NEGATIVE mg/dL   Urobilinogen, UA 0.2 0.0 - 1.0 mg/dL   Nitrite NEGATIVE NEGATIVE   Leukocytes, UA NEGATIVE NEGATIVE  Urine microscopic-add on     Status: None   Collection Time: 03/27/14  4:12 PM  Result Value Ref Range   Squamous Epithelial / LPF RARE RARE   WBC, UA 0-2 <3 WBC/hpf   RBC / HPF 3-6 <3 RBC/hpf   Bacteria, UA RARE RARE   Urine-Other MUCOUS PRESENT   Glucose, capillary     Status: Abnormal   Collection Time: 03/27/14  5:01 PM  Result Value Ref Range   Glucose-Capillary 107 (H) 70 - 99 mg/dL  Glucose, capillary     Status: None   Collection Time: 03/27/14  9:23 PM  Result Value Ref Range   Glucose-Capillary 82 70 - 99 mg/dL  Glucose, capillary     Status: Abnormal   Collection Time: 03/28/14  7:22 AM  Result Value Ref Range   Glucose-Capillary 128 (H) 70 - 99 mg/dL   Comment 1 Notify RN    US Abdomen Limited Ruq  03/27/2014   CLINICAL DATA:  Pain  EXAM: US ABDOMEN LIMITED - RIGHT UPPER  QUADRANT  COMPARISON:  None.  FINDINGS: Gallbladder:  No gallstones or wall thickening visualized. No sonographic Murphy sign noted.  Common bile duct:  Diameter: 5.4 mm in diameter within normal limits.  Liver:  No focal lesion identified. Within normal limits in parenchymal echogenicity.  IMPRESSION: Unremarkable right upper quadrant ultrasound.   Electronically Signed   By: Lahoma Crocker M.D.   On: 03/27/2014 22:15    Assessment: Acute on chronic upper GI symptoms with weight loss and what sounds like possible odynophagia or severe reflux symptoms Plan:  IV PPI. Continue on clear liquid diet. Will proceed with EGD in the morning. Maelie Chriswell C 03/28/2014, 10:45 AM

## 2014-03-28 NOTE — Progress Notes (Signed)
PROGRESS NOTE  Russell [name] BJY:[id number] DOB: Mar 13, 1970 DOA: 03/24/2014 PCP: [name], MD  HPI: 44 year old male known poorly controlled diabetic type II diagnosed in 2010 with complications neuropathy/diabetic macular degeneration, prior olecranon bursitis/gout, poorly controlled HTN, recent diagnosis? Right lower extremity cellulitis/bilateral DVT/superficial phlebitis, "asthma" [facility].m[facility] with nausea vomiting, found to be in DKA.   Subjective / 24 H Interval events - continue to have difficulties eating, ongoing nausea/vomiting   Assessment/Plan: Active Problems:   Type 2 diabetes mellitus-diagnosed ~2010   Hyperlipidemia   Back pain, chronic   Vomiting   DKA, type 2, not at goal   Secondary DM with DKA-AG=16, BIcarb Nl   Type 2 diabetes mellitus with lactic acidosis without coma   DVT (deep venous thrombosis), H/o 01/2014-on Xarelto   Hypothyroidism (acquired)   Obesity (BMI 30.0-34.9)   Nausea and vomiting/dysphagia - possible gastroparesis, patient admits today as to some ongoing symptoms of early satiety and nausea with larger meals for couple of months. A1C at 10.3 showing poor control - endorses 30 lb weight loss in the past few months and some dysphagia type symptoms for same amount of time - GI consult today, EGD tomorrow - continue supportive treatment with antiemetics - reglan scheduled - continue protonix - schedule zofran - LFTs normal, RUQ normal   DM2 - poorly controlled, with complications with neuropathy, macular degeneration - allow carb modified diet - seen by Dr. Everardo All in the past, now seeing Dr. Crista Curb - CBGs improved  DKA  - initial anion gap of 16 on admission, improved after insulin drip and IV fluids - off of insulin drom 3/23 4 am, back on home Lantus of 60 daily, Lispro 8 U TID plus resistant SSI - resolved  AKI - multifactorial, NSAID use, DM, Metformin, Lasix, Lisinopril - hold  NSAIDs, Metformin, Lasix - improved  HTN  - quite hypertensive still, continue clonidine, resumed Lisinopril, added Norvasc - blood pressure in the 150-160s/100-110s in the outpatient setting as well - blood pressure similar to outpatient values today   Sinus tachycardia - seen in office visits as well, stable, no events on telemetry - d/c telemetry   DVT  - recently diagnosed in January - continue Xarelto   Back pain - chronic  Hypothyroidism - continue Synthroid  Obesity (BMI 30.0-34.9) - outpatient risk factor modification and counseling needed    Diet: Diet Carb Modified Fluids: none DVT Prophylaxis: On Xarelto  Code Status: Full Code Family Communication: d/w fiancee bedside  Disposition Plan: home when ready   Consultants:  None   Procedures:  None    Antibiotics  Anti-infectives    None      Studies  US Abdomen Limited Ruq  Apr 12, 2014   CLINICAL DATA:  Pain  EXAM: US ABDOMEN LIMITED - RIGHT UPPER QUADRANT  COMPARISON:  None.  FINDINGS: Gallbladder:  No gallstones or wall thickening visualized. No sonographic Murphy sign noted.  Common bile duct:  Diameter: 5.4 mm in diameter within normal limits.  Liver:  No focal lesion identified. Within normal limits in parenchymal echogenicity.  IMPRESSION: Unremarkable right upper quadrant ultrasound.   Electronically Signed   By: Natasha Mead M.D.   On: 2014-04-12 22:15   Objective  Filed Vitals:   2014/04/12 2351 03/28/14 0500 03/28/14 0742 03/28/14 1006  BP: 155/83 186/94 159/94 154/92  Pulse: 99 101 100 105  Temp: 99 F (37.2 C) 98 F (36.7 C)  98.2 F (36.8 C)  TempSrc: Oral Oral  Oral  Resp: Height:      Weight:      SpO2: 96% 96%  97%    Intake/Output Summary (Last 24 hours) at 03/28/14 1101 Last data filed at 03/28/14 1007  Gross per 24 hour  Intake    870 ml  Output   1450 ml  Net   -580 ml   Filed Weights   03/24/14 1600  Weight: 111 kg (244 lb 11.4 oz)    Exam:  General:  NAD  HEENT: no scleral icterus, PERRL, slightly erythematous oropharynx, no exudates  Cardiovascular: RRR without MRG, 2+ peripheral pulses, no edema  Respiratory: CTA biL, good air movement, no wheezing, no crackles, no rales  Abdomen: soft, non tender, BS +, no guarding  MSK/Extremities: no clubbing/cyanosis, no joint swelling  Skin: no rashes  Neuro: non focal  Data Reviewed: Basic Metabolic Panel:  Recent Labs Lab 03/24/14 1622 03/25/14 0206 03/25/14 1350 03/26/14 0545 03/27/14 0955  NA 139 144 142 139 136  K 3.8 3.9 3.0* 3.8 3.8  CL 105 109 113* 103 102  CO2 GLUCOSE 424* 129* 187* 224* 137*  BUN 42* 36* 27* 24* 23  CREATININE 1.63* 1.49* 1.05 1.10 1.19  CALCIUM 8.3* 8.5 6.9* 8.0* 7.9*   Liver Function Tests:  Recent Labs Lab 03/24/14 1138 03/24/14 1336 03/26/14 0545 03/27/14 0955  AST 36 39* 50* 32  ALT 39 41 43 34  ALKPHOS 101 99 86 82  BILITOT 0.6 1.1 0.8 0.8  PROT 6.2 6.3 5.7* 5.4*  ALBUMIN 3.6 3.4* 2.7* 2.3*    Recent Labs Lab 03/24/14 1138 03/24/14 1336  LIPASE <10 16    CBC:  Recent Labs Lab 03/24/14 1142 03/24/14 1336 03/24/14 1622 03/26/14 0545  WBC 16.5* 15.6* 15.9* 15.2*  HGB 16.9 15.8 15.2 15.6  HCT 50.9 44.9 42.5 45.1  MCV 86.5 83.9 83.7 85.1  PLT  --  288 283 257   CBG:  Recent Labs Lab 03/27/14 0830 03/27/14 1143 03/27/14 1701 03/27/14 2123 03/28/14 0722  GLUCAP 133* 119* 107* 82 128*    Recent Results (from the past 240 hour(s))  MRSA PCR Screening     Status: None   Collection Time: 03/24/14  6:00 PM  Result Value Ref Range Status   MRSA by PCR NEGATIVE NEGATIVE Final    Comment:        The GeneXpert MRSA Assay (FDA approved for NASAL specimens only), is one component of a comprehensive MRSA colonization surveillance program. It is not intended to diagnose MRSA infection nor to guide or monitor treatment for MRSA infections.      Scheduled Meds: .  amLODipine  5 mg Oral Daily  . cloNIDine  0.1 mg Transdermal Weekly  . hydrALAZINE  10 mg Intravenous Once  .  HYDROmorphone (DILAUDID) injection  1 mg Intravenous Once  . insulin aspart  0-20 Units Subcutaneous TID WC  . insulin aspart  8 Units Subcutaneous TID WC  . insulin glargine  60 Units Subcutaneous Daily  . levothyroxine  50 mcg Oral QAC breakfast  . lisinopril  20 mg Oral Daily  . metoCLOPramide (REGLAN) injection  5 mg Intravenous 3 times per day  . ondansetron (ZOFRAN) IV  4 mg Intravenous 3 times per day  . pantoprazole (PROTONIX) IV  40 mg Intravenous Q12H  . rivaroxaban  20 mg Oral Q supper   Continuous Infusions: . sodium chloride 10 mL/hr at 03/26/14 [phone]  Time spent: 25 minutes  Russell Pertostin Kimberlly Norgard, MD [facility] Pager [phone]. If 7 PM - 7 AM, please contact night-coverage at [email or web address], password [email or web address] 03/28/2014, 11:01 AM  LOS: 4 days

## 2014-03-29 ENCOUNTER — Encounter (HOSPITAL_COMMUNITY)
Admission: EM | Disposition: A | Discharge status: Home / Self Care | Payer: Self-pay | Source: Home / Self Care | Attending: Internal Medicine

## 2014-03-29 ENCOUNTER — Encounter (HOSPITAL_COMMUNITY): Payer: Self-pay

## 2014-03-29 ENCOUNTER — Other Ambulatory Visit: Payer: Self-pay | Admitting: Family Medicine

## 2014-03-29 DIAGNOSIS — E291 Testicular hypofunction: Secondary | ICD-10-CM

## 2014-03-29 DIAGNOSIS — E1110 Type 2 diabetes mellitus with ketoacidosis without coma: Secondary | ICD-10-CM

## 2014-03-29 HISTORY — PX: ESOPHAGOGASTRODUODENOSCOPY: SHX5428

## 2014-03-29 LAB — GLUCOSE, CAPILLARY
GLUCOSE-CAPILLARY: 65 mg/dL — AB (ref 70–99)
Glucose-Capillary: 128 mg/dL — ABNORMAL HIGH (ref 70–99)

## 2014-03-29 SURGERY — EGD (ESOPHAGOGASTRODUODENOSCOPY)
Anesthesia: Moderate Sedation

## 2014-03-29 MED ORDER — SODIUM CHLORIDE 0.9 % IV SOLN
INTRAVENOUS | Status: DC
  Administered 2014-03-29: 10:00:00 via INTRAVENOUS

## 2014-03-29 MED ORDER — DEXTROSE 50 % IV SOLN
INTRAVENOUS | Status: AC
  Administered 2014-03-29: 50 mL
  Filled 2014-03-29: qty 50

## 2014-03-29 MED ORDER — PANTOPRAZOLE SODIUM 40 MG PO TBEC
40.0000 mg | DELAYED_RELEASE_TABLET | Freq: Two times a day (BID) | ORAL | Status: DC
  Administered 2014-03-29: 40 mg via ORAL
  Filled 2014-03-29: qty 1

## 2014-03-29 MED ORDER — MIDAZOLAM HCL 10 MG/2ML IJ SOLN
INTRAMUSCULAR | Status: AC
  Filled 2014-03-29: qty 2

## 2014-03-29 MED ORDER — SUCRALFATE 1 GM/10ML PO SUSP
1.0000 g | Freq: Three times a day (TID) | ORAL | Status: DC
  Administered 2014-03-29: 1 g via ORAL
  Filled 2014-03-29 (×2): qty 10

## 2014-03-29 MED ORDER — MIDAZOLAM HCL 10 MG/2ML IJ SOLN
INTRAMUSCULAR | Status: DC | PRN
  Administered 2014-03-29 (×3): 2 mg via INTRAVENOUS

## 2014-03-29 MED ORDER — DIPHENHYDRAMINE HCL 50 MG/ML IJ SOLN
INTRAMUSCULAR | Status: AC
  Filled 2014-03-29: qty 1

## 2014-03-29 MED ORDER — HYDROCODONE-ACETAMINOPHEN 5-325 MG PO TABS: 1.0000 | ORAL_TABLET | Freq: Four times a day (QID) | ORAL | Status: DC | PRN

## 2014-03-29 MED ORDER — BUTAMBEN-TETRACAINE-BENZOCAINE 2-2-14 % EX AERO
INHALATION_SPRAY | CUTANEOUS | Status: DC | PRN
  Administered 2014-03-29: 2 via TOPICAL

## 2014-03-29 MED ORDER — SUCRALFATE 1 GM/10ML PO SUSP: 1.0000 g | Freq: Three times a day (TID) | ORAL | Status: DC

## 2014-03-29 MED ORDER — INSULIN LISPRO 200 UNIT/ML ~~LOC~~ SOPN: 1.0000 [IU] | PEN_INJECTOR | Freq: Every day | SUBCUTANEOUS | Status: DC

## 2014-03-29 MED ORDER — PANTOPRAZOLE SODIUM 40 MG PO TBEC: 40.0000 mg | DELAYED_RELEASE_TABLET | Freq: Two times a day (BID) | ORAL | Status: DC

## 2014-03-29 MED ORDER — TESTOSTERONE 20.25 MG/1.25GM (1.62%) TD GEL: 1.0000 | Freq: Every day | TRANSDERMAL | Status: DC

## 2014-03-29 MED ORDER — PROMETHAZINE HCL 12.5 MG PO TABS: 12.5000 mg | ORAL_TABLET | Freq: Four times a day (QID) | ORAL | Status: DC | PRN

## 2014-03-29 MED ORDER — ONDANSETRON 4 MG PO TBDP: 4.0000 mg | ORAL_TABLET | Freq: Three times a day (TID) | ORAL | Status: DC | PRN

## 2014-03-29 MED ORDER — FENTANYL CITRATE 0.05 MG/ML IJ SOLN
INTRAMUSCULAR | Status: AC
  Filled 2014-03-29: qty 2

## 2014-03-29 MED ORDER — METOCLOPRAMIDE HCL 5 MG PO TABS: 5.0000 mg | ORAL_TABLET | Freq: Three times a day (TID) | ORAL | Status: DC | PRN

## 2014-03-29 MED ORDER — FENTANYL CITRATE 0.05 MG/ML IJ SOLN
INTRAMUSCULAR | Status: DC | PRN
  Administered 2014-03-29 (×3): 25 ug via INTRAVENOUS

## 2014-03-29 MED ORDER — AMLODIPINE BESYLATE 5 MG PO TABS: 5.0000 mg | ORAL_TABLET | Freq: Every day | ORAL | Status: DC

## 2014-03-29 MED ORDER — INSULIN GLARGINE 100 UNIT/ML ~~LOC~~ SOLN
60.0000 [IU] | Freq: Two times a day (BID) | SUBCUTANEOUS | Status: DC
Start: 2014-03-29 — End: 2014-04-06

## 2014-03-29 NOTE — Op Note (Signed)
[facility] [address] [place], [id number]   ENDOSCOPY PROCEDURE REPORT  PATIENT: Russell [name], Russell B  MR#: [id number] BIRTHDATE: Dec 29, 1970 , 44  yrs. old GENDER: male ENDOSCOPIST: Dorena CookeyJohn Madigan Rosensteel, MD REFERRED BY: PROCEDURE DATE:  03/29/2014 PROCEDURE: ASA CLASS: INDICATIONS:  severe odynophagia MEDICATIONS: Versed 6 mg, fentanyl 75 g TOPICAL ANESTHETIC: Cetacaine spray  DESCRIPTION OF PROCEDURE: After the risks benefits and alternatives of the procedure were thoroughly explained, informed consent was obtained.  The Pentax Gastroscope [id number] endoscope was introduced through the mouth and advanced to the second portion of the duodenum , Without limitations.  The instrument was slowly withdrawn as the mucosa was fully examined.    esophagus: Severe exudative circumferential esophagitis from the GE junction to about 28 cm where it faded to an normal mucosa. No ulcers or vesicles seen. No classic candida plaques, however brushings were taken to rule out Candida.. Overall the appearance was most consistent with severe reflux Stomach: Moderate amount of bilious fluid in the stomach no mucosal abnormalities, no retained food       retroflexion of the cardia and normal Duodenum: Normal          The scope was then withdrawn from the patient and the procedure completed.  COMPLICATIONS: There were no immediate complications.  ENDOSCOPIC IMPRESSION: severe erosive esophagitis, suspect reflux greater than candida   RECOMMENDATIONS: double dose proton pump inhibitor plus Carafate, bland diet, await brushings  REPEAT EXAM:  eSigned:  Dorena CookeyJohn Shayaan Parke, MD 03/29/2014 8:56 AM    CC:  CPT CODES: ICD CODES:  The ICD and CPT codes recommended by this software are interpretations from the data that the clinical staff has captured with the software.  The verification of the translation of this report to the ICD and CPT codes and modifiers is the sole responsibility of  the health care institution and practicing physician where this report was generated.  [employer] will not be held responsible for the validity of the ICD and CPT codes included on this report.  AMA assumes no liability for data contained or not contained herein. CPT is a Publishing rights managerregistered trademark of the [employer].

## 2014-03-29 NOTE — Discharge Instructions (Signed)
Follow with Elvina SidleLAUENSTEIN,KURT, MD in 5-7 days  Please get a complete blood count and chemistry panel checked by your Primary MD at your next visit, and again as instructed by your Primary MD. Please get your medications reviewed and adjusted by your Primary MD.  Please request your Primary MD to go over all Hospital Tests and Procedure/Radiological results at the follow up, please get all Hospital records sent to your Prim MD by signing hospital release before you go home.  If you had Pneumonia of Lung problems at the Hospital: Please get a 2 view Chest X ray done in 6-8 weeks after hospital discharge or sooner if instructed by your Primary MD.  If you have Congestive Heart Failure: Please call your Cardiologist or Primary MD anytime you have any of the following symptoms:  1) 3 pound weight gain in 24 hours or 5 pounds in 1 week  2) shortness of breath, with or without a dry hacking cough  3) swelling in the hands, feet or stomach  4) if you have to sleep on extra pillows at night in order to breathe  Follow cardiac low salt diet and 1.5 lit/day fluid restriction.  If you have diabetes Accuchecks 4 times/day, Once in AM empty stomach and then before each meal. Log in all results and show them to your primary doctor at your next visit. If any glucose reading is under 80 or above 300 call your primary MD immediately.  If you have Seizure/Convulsions/Epilepsy: Please do not drive, operate heavy machinery, participate in activities at heights or participate in high speed sports until you have seen by Primary MD or a Neurologist and advised to do so again.  If you had Gastrointestinal Bleeding: Please ask your Primary MD to check a complete blood count within one week of discharge or at your next visit. Your endoscopic/colonoscopic biopsies that are pending at the time of discharge, will also need to followed by your Primary MD.  Get Medicines reviewed and adjusted. Please take all your  medications with you for your next visit with your Primary MD  Please request your Primary MD to go over all hospital tests and procedure/radiological results at the follow up, please ask your Primary MD to get all Hospital records sent to his/her office.  If you experience worsening of your admission symptoms, develop shortness of breath, life threatening emergency, suicidal or homicidal thoughts you must seek medical attention immediately by calling 911 or calling your MD immediately  if symptoms less severe.  You must read complete instructions/literature along with all the possible adverse reactions/side effects for all the Medicines you take and that have been prescribed to you. Take any new Medicines after you have completely understood and accpet all the possible adverse reactions/side effects.   Do not drive or operate heavy machinery when taking Pain medications.   Do not take more than prescribed Pain, Sleep and Anxiety Medications  Special Instructions: If you have smoked or chewed Tobacco  in the last 2 yrs please stop smoking, stop any regular Alcohol  and or any Recreational drug use.  Wear Seat belts while driving.  Please note You were cared for by a hospitalist during your hospital stay. If you have any questions about your discharge medications or the care you received while you were in the hospital after you are discharged, you can call the unit and asked to speak with the hospitalist on call if the hospitalist that took care of you is not available. Once you  are discharged, your primary care physician will handle any further medical issues. Please note that NO REFILLS for any discharge medications will be authorized once you are discharged, as it is imperative that you return to your primary care physician (or establish a relationship with a primary care physician if you do not have one) for your aftercare needs so that they can reassess your need for medications and monitor your  lab values.  You can reach the hospitalist office at phone [phone] or fax [phone]   If you do not have a primary care physician, you can call [phone] for a physician referral.  Activity: As tolerated with Full fall precautions use walker/cane & assistance as needed  Diet: diabetic  Disposition Home

## 2014-03-29 NOTE — Discharge Summary (Signed)
Physician Discharge Summary  Russell [name] YBO:[id number] DOB: [birth year] DOA: 03/24/2014  PCP: [name], MD  Admit date: 03/24/2014 Discharge date: 03/29/2014  Time spent: > 30 minutes  Recommendations for Outpatient Follow-up:  1. Follow up with Dr. Joseph Art in 2 weeks 2. Follow up with Dr. Hartford Poli in 1-2 weeks  Discharge Diagnoses:  Active Problems:   Type 2 diabetes mellitus-diagnosed ~2010   Hyperlipidemia   Back pain, chronic   Vomiting   DKA, type 2, not at goal   Secondary DM with DKA-AG=16, BIcarb Nl   Type 2 diabetes mellitus with lactic acidosis without coma   DVT (deep venous thrombosis), H/o 01/2014-on Xarelto   Hypothyroidism (acquired)   Obesity (BMI 30.0-34.9)  Discharge Condition: stable  Diet recommendation: diabetic   Filed Weights   03/24/14 1600  Weight: 111 kg (244 lb 11.4 oz)   History of present illness:  per Dr. Verlon Au 44 year old male known poorly controlled diabetic type II diagnosed in 2778 with complications neuropathy/diabetic macular degeneration, prior olecranon bursitis/gout, poorly controlled HTN, recent diagnosis? Right lower extremity cellulitis/bilateral DVT/superficial phlebitis, "asthma" [facility] with nausea vomiting. New onset, -ill contacts, - chest pain, - cocaine use, - arm pain, - radiation of epigastric pain. Multiple episodes of emesis-"once every hour at least"-- outside food, - diarrhea, immunized against the flu this year + No new medications, - lower extremity swelling currently. Note PTA meds = Lasix 40 daily, ibuprofen 800 every 8, lisinopril 20 daily, Naprosyn 500 twice a day Asian actually follows with you could endocrinology Dr. Francetta Found and was last seen by him 12/26/13 in [place] the last office note there was concern that patient may be eating more carbohydrates than he lets on-he apparently was also interested in getting an insulin pump Emergency room workup =  BUN/creatinine 44/1.8--baseline 18/0.9 in 12/2013, Anion gap 16, Lipase 16, AST/ALT 39/41 Potassium 4.4 WBC 15.6 CXR 2 view = low lung volumes with mild bibasilar opacity = atelectasis   Hospital Course:  Nausea and vomiting/dysphagia - possible gastroparesis, patient admits today as to some ongoing symptoms of early satiety and nausea with larger meals for couple of months. A1C at 10.3 showing poor control - endorses 30 lb weight loss in the past few months and some dysphagia type symptoms for same amount of time - given symptom persistence Gastroenterology was consulted and patient underwent an EGD on 3/27 showing severe erosive esophagitis. Patient was placed on BID PPI as well as Carafate. His condition has improved, and on the day of discharge he was able to tolerate a diet. Advised to avoid NSAIDs DM2  - poorly controlled, with complications with neuropathy, macular degeneration - allow carb modified diet - seen by Dr. Loanne Drilling in the past, now seeing Dr. Francetta Found - CBGs improved DKA  - initial anion gap of 16 on admission, improved after insulin drip and IV fluids AKI - multifactorial, NSAID use, DM, Metformin, Lasix, Lisinopril - improved HTN  - quite hypertensive still, continue clonidine, resumed Lisinopril, added Norvasc - blood pressure in the 150-160s/100-110s in the outpatient setting as well Sinus tachycardia - seen in office visits as well, stable, no events on telemetry - d/c telemetry  DVT  - recently diagnosed in January - continue Xarelto  Back pain - chronic Hypothyroidism - continue Synthroid Obesity (BMI 30.0-34.9) - outpatient risk factor modification and counseling needed   Procedures:  EGD 3/27 - severe erosive esophagitis, suspect reflux greater than candida - double dose proton  pump inhibitor plus Carafate, bland diet, await brushings   Consultations:  Gastroenterology   Discharge Exam: Filed Vitals:   03/29/14 0855 03/29/14 0900 03/29/14  0905 03/29/14 0910  BP: 128/81 119/65 98/66 104/66  Pulse: 93 74 96 94  Temp:      TempSrc:      Resp: _0 Height:      Weight:      SpO2: 94% 94% 95% 93%    General: NAD Cardiovascular: RRR Respiratory: CTA biL  Discharge Instructions     Medication List    STOP taking these medications        APIDRA SOLOSTAR 100 UNIT/ML Solostar Pen  Generic drug:  Insulin Glulisine     ibuprofen 800 MG tablet  Commonly known as:  ADVIL,MOTRIN     naproxen 500 MG tablet  Commonly known as:  NAPROSYN      TAKE these medications        albuterol 108 (90 BASE) MCG/ACT inhaler  Commonly known as:  PROVENTIL HFA;VENTOLIN HFA  Inhale 2 puffs into the lungs every 6 (six) hours as needed for wheezing or shortness of breath.     ALPRAZolam 0.5 MG tablet  Commonly known as:  XANAX  TAKE 1 TABLET TWICE DAILY AS NEEDED FOR SLEEP     amLODipine 5 MG tablet  Commonly known as:  NORVASC  Take 1 tablet (5 mg total) by mouth daily.     cloNIDine 0.1 mg/24hr patch  Commonly known as:  CATAPRES - Dosed in mg/24 hr  Place 1 patch (0.1 mg total) onto the skin once a week.     cyclobenzaprine 10 MG tablet  Commonly known as:  FLEXERIL  Take 1 tablet (10 mg total) by mouth 3 (three) times daily as needed for muscle spasms.     furosemide 40 MG tablet  Commonly known as:  LASIX  Take 1 tablet (40 mg total) by mouth daily.     gabapentin 600 MG tablet  Commonly known as:  NEURONTIN  Take 600 mg by mouth 4 (four) times daily.     HYDROcodone-acetaminophen 5-325 MG per tablet  Commonly known as:  NORCO  Take 1 tablet by mouth every 6 (six) hours as needed.     insulin glargine 100 UNIT/ML injection  Commonly known as:  LANTUS  Inject 60 Units into the skin 2 (two) times daily.     Insulin Lispro (Human) 200 UNIT/ML Sopn  Inject 1-50 Units into the skin daily. 1 unit per 5 grams of carbohydrates with meals/snacks plus coverage scale. Total 50 units/day     ketoconazole 2 %  cream  Commonly known as:  NIZORAL  Apply 1 application topically daily.     levothyroxine 50 MCG tablet  Commonly known as:  SYNTHROID, LEVOTHROID  Take 1 tablet (50 mcg total) by mouth daily.     lisinopril 20 MG tablet  Commonly known as:  PRINIVIL,ZESTRIL  Take 1 tablet (20 mg total) by mouth daily.     metFORMIN 1000 MG tablet  Commonly known as:  GLUCOPHAGE  Take 1,000 mg by mouth 2 (two) times daily with a meal.     metoCLOPramide 5 MG tablet  Commonly known as:  REGLAN  Take 1 tablet (5 mg total) by mouth every 8 (eight) hours as needed for nausea.     mometasone 220 MCG/INH inhaler  Commonly known as:  ASMANEX 60 METERED DOSES  Inhale 2 puffs into the lungs daily.  ondansetron 4 MG disintegrating tablet  Commonly known as:  ZOFRAN ODT  Take 1 tablet (4 mg total) by mouth every 8 (eight) hours as needed for nausea or vomiting.     ONETOUCH VERIO IQ SYSTEM W/DEVICE Kit  1 each by Does not apply route.     ONETOUCH VERIO test strip  Generic drug:  glucose blood  7-8 times per day     pantoprazole 40 MG tablet  Commonly known as:  PROTONIX  Take 1 tablet (40 mg total) by mouth 2 (two) times daily before a meal.     promethazine 12.5 MG tablet  Commonly known as:  PHENERGAN  Take 1 tablet (12.5 mg total) by mouth every 6 (six) hours as needed for nausea or vomiting.     rivaroxaban 20 MG Tabs tablet  Commonly known as:  XARELTO  Take 1 tablet (20 mg total) by mouth daily with supper.     sucralfate 1 GM/10ML suspension  Commonly known as:  CARAFATE  Take 10 mLs (1 g total) by mouth 4 (four) times daily -  with meals and at bedtime.     Testosterone 20.25 MG/1.25GM (1.62%) Gel  Commonly known as:  ANDROGEL  Place 1 applicator onto the skin daily.           Follow-up Information    Follow up with [name], MD In 2 weeks.   Specialty:  Family Medicine   Contact information:   [place] [address] [phone]       Follow  up with LEVY, MATTHEW E, MD In 5 days.   Specialty:  Endocrinology   Contact information:   [address] [place] [address] [phone]       The results of significant diagnostics from this hospitalization (including imaging, microbiology, ancillary and laboratory) are listed below for reference.    Significant Diagnostic Studies: Dg Chest 2 View  03/24/2014   CLINICAL DATA:  44 year old male with nausea vomiting for 2 days. Hyperglycemia. Leukocytosis. Initial encounter.  EXAM: CHEST  2 VIEW  COMPARISON:  12/28/2013.  FINDINGS: Semi upright AP and lateral views of the chest at 1332 hours. Lower lung volumes. Normal cardiac size and mediastinal contours. Visualized tracheal air column is within normal limits. No pneumothorax, pulmonary edema, pleural effusion or consolidation. Crowding of markings at the lung bases. No acute osseous abnormality identified.  IMPRESSION: Low lung volumes with mild bibasilar opacity most resembling atelectasis.   Electronically Signed   By: Genevie Ann M.D.   On: 03/24/2014 14:09   US Abdomen Limited Ruq  03/27/2014   CLINICAL DATA:  Pain  EXAM: US ABDOMEN LIMITED - RIGHT UPPER QUADRANT  COMPARISON:  None.  FINDINGS: Gallbladder:  No gallstones or wall thickening visualized. No sonographic Murphy sign noted.  Common bile duct:  Diameter: 5.4 mm in diameter within normal limits.  Liver:  No focal lesion identified. Within normal limits in parenchymal echogenicity.  IMPRESSION: Unremarkable right upper quadrant ultrasound.   Electronically Signed   By: Lahoma Crocker M.D.   On: 03/27/2014 22:15    Microbiology: Recent Results (from the past 240 hour(s))  MRSA PCR Screening     Status: None   Collection Time: 03/24/14  6:00 PM  Result Value Ref Range Status   MRSA by PCR NEGATIVE NEGATIVE Final    Comment:        The GeneXpert MRSA Assay (FDA approved for NASAL specimens only), is one component of a comprehensive MRSA colonization  surveillance  program. It is not intended to diagnose MRSA infection nor to guide or monitor treatment for MRSA infections.      Labs: Basic Metabolic Panel:  Recent Labs Lab 03/24/14 1138 03/24/14 1336 03/24/14 1622 03/25/14 0206 03/25/14 1350 03/26/14 0545 03/27/14 0955  NA 134* 137 139 144 142 139 136  K 4.9 4.4 3.8 3.9 3.0* 3.8 3.8  CL 92* 99 105 109 113* 103 102  CO2 _0 GLUCOSE 695* 583* 424* 129* 187* 224* 137*  BUN 40* 44* 42* 36* 27* 24* 23  CREATININE 1.80* 1.80* 1.63* 1.49* 1.05 1.10 1.19  CALCIUM 9.5 8.8 8.3* 8.5 6.9* 8.0* 7.9*   Liver Function Tests:  Recent Labs Lab 03/24/14 1138 03/24/14 1336 03/26/14 0545 03/27/14 0955  AST 36 39* 50* 32  ALT 39 41 43 34  ALKPHOS 101 99 86 82  BILITOT 0.6 1.1 0.8 0.8  PROT 6.2 6.3 5.7* 5.4*  ALBUMIN 3.6 3.4* 2.7* 2.3*    Recent Labs Lab 03/24/14 1138 03/24/14 1336  LIPASE <10 16   No results for input(s): AMMONIA in the last 168 hours. CBC:  Recent Labs Lab 03/24/14 1142 03/24/14 1336 03/24/14 1622 03/26/14 0545  WBC 16.5* 15.6* 15.9* 15.2*  HGB 16.9 15.8 15.2 15.6  HCT 50.9 44.9 42.5 45.1  MCV 86.5 83.9 83.7 85.1  PLT  --  288 283 257    CBG:  Recent Labs Lab 03/28/14 1727 03/28/14 1757 03/28/14 2126 03/29/14 0738 03/29/14 0806  GLUCAP 65* 111* 92 65* 128*     Signed:  GHERGHE, COSTIN  [facility] 03/29/2014, 2:30 PM

## 2014-03-29 NOTE — Progress Notes (Signed)
[facility] Progress Note  Subjective: Odynophagia little bit better  Objective: Vital signs in last 24 hours: Temp:  [98.1 F (36.7 C)-98.5 F (36.9 C)] 98.3 F (36.8 C) (03/27 0513) Pulse Rate:  [74-105] 74 (03/27 0900) Resp:  [15-25] 25 (03/27 0900) BP: (119-192)/(65-109) 119/65 mmHg (03/27 0900) SpO2:  [93 %-99 %] 94 % (03/27 0900) Weight change:    PE: Unchanged  Lab Results: Results for orders placed or performed during the hospital encounter of 03/24/14 (from the past 24 hour(s))  Glucose, capillary     Status: Abnormal   Collection Time: 03/28/14 11:46 AM  Result Value Ref Range   Glucose-Capillary 144 (H) 70 - 99 mg/dL   Comment 1 Notify RN   Glucose, capillary     Status: Abnormal   Collection Time: 03/28/14  5:27 PM  Result Value Ref Range   Glucose-Capillary 65 (L) 70 - 99 mg/dL   Comment 1 Notify RN   Glucose, capillary     Status: Abnormal   Collection Time: 03/28/14  5:57 PM  Result Value Ref Range   Glucose-Capillary 111 (H) 70 - 99 mg/dL   Comment 1 Notify RN   Glucose, capillary     Status: None   Collection Time: 03/28/14  9:26 PM  Result Value Ref Range   Glucose-Capillary 92 70 - 99 mg/dL  Glucose, capillary     Status: Abnormal   Collection Time: 03/29/14  7:38 AM  Result Value Ref Range   Glucose-Capillary 65 (L) 70 - 99 mg/dL  Glucose, capillary     Status: Abnormal   Collection Time: 03/29/14  8:06 AM  Result Value Ref Range   Glucose-Capillary 128 (H) 70 - 99 mg/dL    Studies/Results: [place] Abdomen Limited Ruq  03/27/2014   CLINICAL DATA:  Pain  EXAM: US ABDOMEN LIMITED - RIGHT UPPER QUADRANT  COMPARISON:  None.  FINDINGS: Gallbladder:  No gallstones or wall thickening visualized. No sonographic Murphy sign noted.  Common bile duct:  Diameter: 5.4 mm in diameter within normal limits.  Liver:  No focal lesion identified. Within normal limits in parenchymal echogenicity.  IMPRESSION: Unremarkable right upper quadrant ultrasound.    Electronically Signed   By: Natasha MeadLiviu  Pop M.D.   On: 03/27/2014 22:15      Assessment: EGD shows severe distal erosive esophagitis probably reflux cannot rule out candida but appearance not typical  Plan: Double dose proton pump inhibitor okay to switch to by mouth. We'll add Carafate and await brushings to rule out Candida., Possible discharge    Britta Louth C 03/29/2014, 9:05 AM

## 2014-03-29 NOTE — Progress Notes (Signed)
Hypoglycemic Event  CBG: 65  Treatment: 25 ml D50  Symptoms: shaking  Follow-up CBG: Time: 0805 CBG Result: 128  Possible Reasons for Event: medications and poor po intake  Comments/MD notified:     Cyndy Freezeeutsch, Mina Carlisi  Remember to initiate Hypoglycemia Order Set & complete

## 2014-03-30 ENCOUNTER — Encounter (HOSPITAL_COMMUNITY): Payer: Self-pay | Admitting: Gastroenterology

## 2014-03-30 NOTE — Progress Notes (Signed)
Discharge summary sent to payer through [facility]  

## 2014-04-03 ENCOUNTER — Ambulatory Visit (INDEPENDENT_AMBULATORY_CARE_PROVIDER_SITE_OTHER): Payer: Managed Care, Other (non HMO) | Admitting: Family Medicine

## 2014-04-03 VITALS — BP 152/98 | HR 115 | Temp 98.2°F | Resp 18 | Ht 73.0 in | Wt 259.0 lb

## 2014-04-03 DIAGNOSIS — R319 Hematuria, unspecified: Secondary | ICD-10-CM

## 2014-04-03 DIAGNOSIS — E1310 Other specified diabetes mellitus with ketoacidosis without coma: Secondary | ICD-10-CM

## 2014-04-03 DIAGNOSIS — I10 Essential (primary) hypertension: Secondary | ICD-10-CM

## 2014-04-03 DIAGNOSIS — E1110 Type 2 diabetes mellitus with ketoacidosis without coma: Secondary | ICD-10-CM

## 2014-04-03 LAB — POCT CBC
Granulocyte percent: 72.2 %G (ref 37–80)
HCT, POC: 43.5 % (ref 43.5–53.7)
Hemoglobin: 14 g/dL — AB (ref 14.1–18.1)
Lymph, poc: 1.6 (ref 0.6–3.4)
MCH, POC: 27.4 pg (ref 27–31.2)
MCHC: 32.2 g/dL (ref 31.8–35.4)
MCV: 85 fL (ref 80–97)
MID (cbc): 0.6 (ref 0–0.9)
MPV: 7 fL (ref 0–99.8)
POC Granulocyte: 6 (ref 2–6.9)
POC LYMPH PERCENT: 20.1 %L (ref 10–50)
POC MID %: 7.2 %M (ref 0–12)
Platelet Count, POC: 287 10*3/uL (ref 142–424)
RBC: 5.12 M/uL (ref 4.69–6.13)
RDW, POC: 13.3 %
WBC: 8.2 10*3/uL (ref 4.6–10.2)

## 2014-04-03 LAB — POCT URINALYSIS DIPSTICK
Bilirubin, UA: NEGATIVE
Glucose, UA: 100
Ketones, UA: NEGATIVE
Leukocytes, UA: NEGATIVE
Nitrite, UA: NEGATIVE
Protein, UA: >=300
Spec Grav, UA: 1.02
Urobilinogen, UA: 0.2
pH, UA: 6.5

## 2014-04-03 LAB — GLUCOSE, POCT (MANUAL RESULT ENTRY): POC Glucose: 134 mg/dl — AB (ref 70–99)

## 2014-04-03 NOTE — Patient Instructions (Signed)
Currently the labs suggest that your diabetes is well-controlled but that you had quite a rocky road lately. The best plan going forward would be to continue the Lantus at the low dose of 10-15 units daily and checking the blood sugar 3 times a day taking very small doses of Humulin if the blood sugar is over 150.

## 2014-04-03 NOTE — Progress Notes (Signed)
This chart was scribed for Dr. [name], MD by Erling Conte, Medical Scribe. This patient was seen in Room 3 and the patient's care was started at 12:15 PM.  Patient ID: Russell [name] MRN: [id number], DOB: 06-10-1970, 44 y.o. Date of Encounter: 04/03/2014, 12:15 PM  Primary Physician: [name], MD  Chief Complaint:  Chief Complaint  Patient presents with   Hospitalization Follow-up    dvt      HPI: 44 y.o. year old male with history below presents for a follow up of his hospital visit on 03/24/14. Pt was admitted to the hospital for his DKA. He was told he went into to DKA due to his gastroparesis. Pt states perform the hospital admission he had been throwing up multiple times day. He came to the [facility] and his BS was over 600 and he was immediately sent to the hospital. He states he was in the hospital until 03/28/13. He notes that his BP has been higher than normal. He states he is having bilateral leg pain, HA, chest tightness and burning in his chest. He reports he had an EGD test while in the hospital. He also had his pancreas checked. Pt notes he took his BS this morning and it was around 180 and it has been going up from his previous readings this week of 65 and 95. Pt is prescribed to take 60 mg Lantus but has been currently taking 15 units. Pt states he has been urinating normally and having normal bowel movements. He denies any vision changes, diarrhea, vomiting, fever, or cough.    Past Medical History  Diagnosis Date   Thyroid disease    Diabetes mellitus without complication    Hypertension      Home Meds: Prior to Admission medications   Medication Sig Start Date End Date Taking? Authorizing Provider  albuterol (PROVENTIL HFA;VENTOLIN HFA) 108 (90 BASE) MCG/ACT inhaler Inhale 2 puffs into the lungs every 6 (six) hours as needed for wheezing or shortness of breath. 10/22/13  Yes [name], MD  ALPRAZolam Duanne Moron) 0.5 MG tablet TAKE 1 TABLET TWICE  DAILY AS NEEDED FOR SLEEP 09/08/13  Yes [name], MD  amLODipine (NORVASC) 5 MG tablet Take 1 tablet (5 mg total) by mouth daily. 03/29/14  Yes Costin Karlyne Greenspan, MD  Blood Glucose Monitoring Suppl (ONETOUCH VERIO IQ SYSTEM) W/DEVICE KIT 1 each by Does not apply route. 06/02/13  Yes Historical Provider, MD  cloNIDine (CATAPRES - DOSED IN MG/24 HR) 0.1 mg/24hr patch Place 1 patch (0.1 mg total) onto the skin once a week. 03/13/14  Yes [name], MD  cyclobenzaprine (FLEXERIL) 10 MG tablet Take 1 tablet (10 mg total) by mouth 3 (three) times daily as needed for muscle spasms. 12/28/13  Yes Thao P Le, DO  furosemide (LASIX) 40 MG tablet Take 1 tablet (40 mg total) by mouth daily. 03/13/14  Yes [name], MD  gabapentin (NEURONTIN) 600 MG tablet Take 600 mg by mouth 4 (four) times daily.  12/19/11  Yes [name], MD  glucose blood [facility] VERIO) test strip 7-8 times per day 06/02/13 06/02/14 Yes Historical Provider, MD  HYDROcodone-acetaminophen (NORCO) 5-325 MG per tablet Take 1 tablet by mouth every 6 (six) hours as needed. 03/29/14  Yes Costin Karlyne Greenspan, MD  insulin glargine (LANTUS) 100 UNIT/ML injection Inject 0.6 mLs (60 Units total) into the skin 2 (two) times daily. 03/29/14  Yes [name], MD  Insulin Lispro, Human, 200 UNIT/ML SOPN Inject 1-50 Units into the skin  daily. 1 unit per 5 grams of carbohydrates with meals/snacks plus coverage scale. Total 50 units/day 03/29/14  Yes [name], MD  ketoconazole (NIZORAL) 2 % cream Apply 1 application topically daily. 02/01/14  Yes [name], MD  levothyroxine (SYNTHROID, LEVOTHROID) 50 MCG tablet Take 1 tablet (50 mcg total) by mouth daily. 05/02/13  Yes [name], MD  lisinopril (PRINIVIL,ZESTRIL) 20 MG tablet Take 1 tablet (20 mg total) by mouth daily. 03/13/14  Yes [name], MD  metFORMIN (GLUCOPHAGE) 1000 MG tablet Take 1,000 mg by mouth 2 (two) times daily with a meal.  06/02/13 06/02/14 Yes Historical Provider,  MD  metoCLOPramide (REGLAN) 5 MG tablet Take 1 tablet (5 mg total) by mouth every 8 (eight) hours as needed for nausea. 03/29/14  Yes Costin Karlyne Greenspan, MD  ondansetron (ZOFRAN ODT) 4 MG disintegrating tablet Take 1 tablet (4 mg total) by mouth every 8 (eight) hours as needed for nausea or vomiting. 03/29/14  Yes Costin Karlyne Greenspan, MD  pantoprazole (PROTONIX) 40 MG tablet Take 1 tablet (40 mg total) by mouth 2 (two) times daily before a meal. 03/29/14  Yes Costin Karlyne Greenspan, MD  promethazine (PHENERGAN) 12.5 MG tablet Take 1 tablet (12.5 mg total) by mouth every 6 (six) hours as needed for nausea or vomiting. 03/29/14  Yes Costin Karlyne Greenspan, MD  rivaroxaban (XARELTO) 20 MG TABS tablet Take 1 tablet (20 mg total) by mouth daily with supper. 03/13/14  Yes [name], MD  sucralfate (CARAFATE) 1 GM/10ML suspension Take 10 mLs (1 g total) by mouth 4 (four) times daily -  with meals and at bedtime. 03/29/14  Yes Costin Karlyne Greenspan, MD  mometasone (ASMANEX 60 METERED DOSES) 220 MCG/INH inhaler Inhale 2 puffs into the lungs daily. Patient not taking: Reported on 04/03/2014 01/27/14   [name], MD  Testosterone (ANDROGEL) 20.25 MG/1.25GM (1.62%) GEL Place 1 applicator onto the skin daily. Patient not taking: Reported on 04/03/2014 03/29/14   [name], MD    Allergies: No Known Allergies  History   Social History   Marital Status: Single    Spouse Name: N/A   Number of Children: N/A   Years of Education: N/A   Occupational History   Not on file.   Social History Main Topics   Smoking status: Never Smoker    Smokeless tobacco: Not on file   Alcohol Use: No   Drug Use: No   Sexual Activity: Yes   Other Topics Concern   Not on file   Social History Narrative   Patient currently lives with his fiance   Works with facilities   Nonsmoker, nondrinker        Review of Systems: Constitutional: negative for chills, fever, night sweats, weight changes, or fatigue  HEENT: positive  for chest tightness. negative for vision changes, cough, hearing loss, congestion, rhinorrhea, ST, epistaxis, or sinus pressure Cardiovascular: negative for palpitations Respiratory: negative for hemoptysis, wheezing, shortness of breath, or cough Abdominal: negative for abdominal pain, nausea, vomiting, diarrhea, or constipation Dermatological: negative for rash Neurologic: positive for HA. negative for dizziness, or syncope Musk: positive for myalgias All other systems reviewed and are otherwise negative with the exception to those above and in the HPI.   Physical Exam: Blood pressure 152/98, pulse 115, temperature 98.2 F (36.8 C), temperature source Oral, resp. rate 18, height 6' 1"  (1.854 m), weight 259 lb (117.482 kg), SpO2 98 %., Body mass index is 34.18 kg/(m^2).  Recheck BP in room 136/88 General: Well developed,  well nourished, in no acute distress. Head: Normocephalic, atraumatic, eyes without discharge, sclera non-icteric, nares are without discharge. Bilateral auditory canals clear, TM's are without perforation, pearly grey and translucent with reflective cone of light bilaterally. Oral cavity moist, posterior pharynx without exudate, erythema, peritonsillar abscess, or post nasal drip.  Neck: Supple. No thyromegaly. Full ROM. No lymphadenopathy. Lungs: Clear bilaterally to auscultation without wheezes, rales, or rhonchi. Breathing is unlabored. Heart: RR with S1 S2.  Tachycardia present HR 110. No murmurs, rubs, or gallops appreciated. Thready pulses in feet. Abdomen: Soft, mildly tender in epigastrium, non-distended with normoactive bowel sounds. No hepatomegaly. No rebound/guarding. No obvious abdominal masses.  Msk:  Strength and tone normal for age. Mild 1+ edema in both feet Extremities/Skin: Warm and dry. No clubbing or cyanosis. No edema. No rashes or suspicious lesions. Neuro: Alert and oriented X 3. Moves all extremities spontaneously. Gait is normal. CNII-XII grossly in  tact. Psych:  Responds to questions appropriately with a normal affect.   Labs: Results for orders placed or performed in visit on 04/03/14  POCT glucose (manual entry)  Result Value Ref Range   POC Glucose 134 (A) 70 - 99 mg/dl  POCT CBC  Result Value Ref Range   WBC 8.2 4.6 - 10.2 K/uL   Lymph, poc 1.6 0.6 - 3.4   POC LYMPH PERCENT 20.1 10 - 50 %L   MID (cbc) 0.6 0 - 0.9   POC MID % 7.2 0 - 12 %M   POC Granulocyte 6.0 2 - 6.9   Granulocyte percent 72.2 37 - 80 %G   RBC 5.12 4.69 - 6.13 M/uL   Hemoglobin 14.0 (A) 14.1 - 18.1 g/dL   HCT, POC 43.5 43.5 - 53.7 %   MCV 85.0 80 - 97 fL   MCH, POC 27.4 27 - 31.2 pg   MCHC 32.2 31.8 - 35.4 g/dL   RDW, POC 13.3 %   Platelet Count, POC 287 142 - 424 K/uL   MPV 7.0 0 - 99.8 fL  POCT urinalysis dipstick  Result Value Ref Range   Color, UA yellow    Clarity, UA clear    Glucose, UA 100    Bilirubin, UA neg    Ketones, UA neg    Spec Grav, UA 1.020    Blood, UA mod    pH, UA 6.5    Protein, UA >=300    Urobilinogen, UA 0.2    Nitrite, UA neg    Leukocytes, UA Negative     ASSESSMENT AND PLAN:  44 y.o. year old male with   1. DM (diabetes mellitus) type 2, uncontrolled, with ketoacidosis   2. Essential hypertension      This chart was scribed in my presence and reviewed by me personally.    ICD-9-CM ICD-10-CM   1. DM (diabetes mellitus) type 2, uncontrolled, with ketoacidosis 250.12 E13.10 POCT glucose (manual entry)     Amylase     Lipase     POCT CBC     POCT urinalysis dipstick     Comprehensive metabolic panel     Ambulatory referral to Endocrinology  2. Essential hypertension 401.9 I10 Ambulatory referral to Endocrinology  3. Hematuria 599.70 R31.9      Signed, [name], MD 04/03/2014 12:45 PM

## 2014-04-04 LAB — COMPREHENSIVE METABOLIC PANEL
ALT: 23 U/L (ref 0–53)
AST: 21 U/L (ref 0–37)
Albumin: 2.6 g/dL — ABNORMAL LOW (ref 3.5–5.2)
Alkaline Phosphatase: 81 U/L (ref 39–117)
BUN: 13 mg/dL (ref 6–23)
CO2: 27 mEq/L (ref 19–32)
Calcium: 8.1 mg/dL — ABNORMAL LOW (ref 8.4–10.5)
Chloride: 104 mEq/L (ref 96–112)
Creat: 0.94 mg/dL (ref 0.50–1.35)
Glucose, Bld: 154 mg/dL — ABNORMAL HIGH (ref 70–99)
Potassium: 4 mEq/L (ref 3.5–5.3)
Sodium: 141 mEq/L (ref 135–145)
Total Bilirubin: 0.4 mg/dL (ref 0.2–1.2)
Total Protein: 5.1 g/dL — ABNORMAL LOW (ref 6.0–8.3)

## 2014-04-04 LAB — LIPASE: Lipase: 10 U/L (ref 0–75)

## 2014-04-04 LAB — AMYLASE: Amylase: 32 U/L (ref 0–105)

## 2014-04-05 ENCOUNTER — Encounter (HOSPITAL_COMMUNITY): Payer: Self-pay | Admitting: Emergency Medicine

## 2014-04-05 ENCOUNTER — Observation Stay (HOSPITAL_COMMUNITY)
Admission: EM | Admit: 2014-04-05 | Discharge: 2014-04-06 | Disposition: A | Discharge status: Home / Self Care | Payer: Managed Care, Other (non HMO) | Attending: Internal Medicine | Admitting: Internal Medicine

## 2014-04-05 DIAGNOSIS — I82409 Acute embolism and thrombosis of unspecified deep veins of unspecified lower extremity: Secondary | ICD-10-CM

## 2014-04-05 DIAGNOSIS — R110 Nausea: Secondary | ICD-10-CM | POA: Diagnosis not present

## 2014-04-05 DIAGNOSIS — E039 Hypothyroidism, unspecified: Secondary | ICD-10-CM | POA: Diagnosis present

## 2014-04-05 DIAGNOSIS — Z7901 Long term (current) use of anticoagulants: Secondary | ICD-10-CM | POA: Diagnosis not present

## 2014-04-05 DIAGNOSIS — E1110 Type 2 diabetes mellitus with ketoacidosis without coma: Secondary | ICD-10-CM

## 2014-04-05 DIAGNOSIS — E118 Type 2 diabetes mellitus with unspecified complications: Secondary | ICD-10-CM

## 2014-04-05 DIAGNOSIS — Z79899 Other long term (current) drug therapy: Secondary | ICD-10-CM | POA: Insufficient documentation

## 2014-04-05 DIAGNOSIS — Z794 Long term (current) use of insulin: Secondary | ICD-10-CM | POA: Insufficient documentation

## 2014-04-05 DIAGNOSIS — R1013 Epigastric pain: Secondary | ICD-10-CM | POA: Insufficient documentation

## 2014-04-05 DIAGNOSIS — R1110 Vomiting, unspecified: Secondary | ICD-10-CM

## 2014-04-05 DIAGNOSIS — R112 Nausea with vomiting, unspecified: Secondary | ICD-10-CM | POA: Diagnosis present

## 2014-04-05 DIAGNOSIS — I809 Phlebitis and thrombophlebitis of unspecified site: Secondary | ICD-10-CM | POA: Diagnosis present

## 2014-04-05 DIAGNOSIS — E119 Type 2 diabetes mellitus without complications: Secondary | ICD-10-CM | POA: Insufficient documentation

## 2014-04-05 DIAGNOSIS — E079 Disorder of thyroid, unspecified: Secondary | ICD-10-CM | POA: Diagnosis not present

## 2014-04-05 DIAGNOSIS — I10 Essential (primary) hypertension: Secondary | ICD-10-CM | POA: Insufficient documentation

## 2014-04-05 DIAGNOSIS — R600 Localized edema: Secondary | ICD-10-CM

## 2014-04-05 LAB — COMPREHENSIVE METABOLIC PANEL
ALT: 35 U/L (ref 0–53)
AST: 36 U/L (ref 0–37)
Albumin: 2.8 g/dL — ABNORMAL LOW (ref 3.5–5.2)
Alkaline Phosphatase: 85 U/L (ref 39–117)
Anion gap: 10 (ref 5–15)
BUN: 16 mg/dL (ref 6–23)
CO2: 28 mmol/L (ref 19–32)
CREATININE: 0.99 mg/dL (ref 0.50–1.35)
Calcium: 8.9 mg/dL (ref 8.4–10.5)
Chloride: 99 mmol/L (ref 96–112)
GFR calc Af Amer: >90 mL/min (ref >90)
GFR calc non Af Amer: >90 mL/min (ref >90)
Glucose, Bld: 219 mg/dL — ABNORMAL HIGH (ref 70–99)
Potassium: 3.6 mmol/L (ref 3.5–5.1)
SODIUM: 137 mmol/L (ref 135–145)
Total Bilirubin: 0.8 mg/dL (ref 0.3–1.2)
Total Protein: 6.6 g/dL (ref 6.0–8.3)

## 2014-04-05 LAB — I-STAT TROPONIN, ED: TROPONIN I, POC: 0.01 ng/mL (ref 0.00–0.08)

## 2014-04-05 LAB — URINALYSIS, ROUTINE W REFLEX MICROSCOPIC
Bilirubin Urine: NEGATIVE
Glucose, UA: 500 mg/dL — AB
KETONES UR: NEGATIVE mg/dL
Leukocytes, UA: NEGATIVE
Nitrite: NEGATIVE
PH: 6.5 (ref 5.0–8.0)
Protein, ur: >300 mg/dL — AB
SPECIFIC GRAVITY, URINE: 1.016 (ref 1.005–1.030)
Urobilinogen, UA: 0.2 mg/dL (ref 0.0–1.0)

## 2014-04-05 LAB — CBC WITH DIFFERENTIAL/PLATELET
Basophils Absolute: 0 10*3/uL (ref 0.0–0.1)
Basophils Relative: 0 % (ref 0–1)
EOS ABS: 0.1 10*3/uL (ref 0.0–0.7)
EOS PCT: 1 % (ref 0–5)
HCT: 45.1 % (ref 39.0–52.0)
HEMOGLOBIN: 15.9 g/dL (ref 13.0–17.0)
Lymphocytes Relative: 10 % — ABNORMAL LOW (ref 12–46)
Lymphs Abs: 1 10*3/uL (ref 0.7–4.0)
MCH: 29.5 pg (ref 26.0–34.0)
MCHC: 35.3 g/dL (ref 30.0–36.0)
MCV: 83.7 fL (ref 78.0–100.0)
MONOS PCT: 7 % (ref 3–12)
Monocytes Absolute: 0.6 10*3/uL (ref 0.1–1.0)
NEUTROS ABS: 8 10*3/uL — AB (ref 1.7–7.7)
NEUTROS PCT: 82 % — AB (ref 43–77)
Platelets: 285 10*3/uL (ref 150–400)
RBC: 5.39 MIL/uL (ref 4.22–5.81)
RDW: 12.4 % (ref 11.5–15.5)
WBC: 9.8 10*3/uL (ref 4.0–10.5)

## 2014-04-05 LAB — URINE MICROSCOPIC-ADD ON

## 2014-04-05 LAB — GLUCOSE, CAPILLARY
GLUCOSE-CAPILLARY: 172 mg/dL — AB (ref 70–99)
Glucose-Capillary: 225 mg/dL — ABNORMAL HIGH (ref 70–99)

## 2014-04-05 LAB — LIPASE, BLOOD: Lipase: 19 U/L (ref 11–59)

## 2014-04-05 MED ORDER — HALOPERIDOL LACTATE 5 MG/ML IJ SOLN
2.0000 mg | Freq: Once | INTRAMUSCULAR | Status: AC
  Administered 2014-04-05: 2 mg via INTRAVENOUS
  Filled 2014-04-05: qty 1

## 2014-04-05 MED ORDER — SODIUM CHLORIDE 0.9 % IV BOLUS (SEPSIS)
1000.0000 mL | Freq: Once | INTRAVENOUS | Status: AC
  Administered 2014-04-05: 1000 mL via INTRAVENOUS

## 2014-04-05 MED ORDER — SODIUM CHLORIDE 0.9 % IV SOLN: INTRAVENOUS | Status: DC

## 2014-04-05 MED ORDER — GABAPENTIN 300 MG PO CAPS
600.0000 mg | ORAL_CAPSULE | Freq: Four times a day (QID) | ORAL | Status: DC
  Administered 2014-04-05: 600 mg via ORAL
  Filled 2014-04-05 (×5): qty 2

## 2014-04-05 MED ORDER — ONDANSETRON HCL 4 MG/2ML IJ SOLN: 4.0000 mg | Freq: Four times a day (QID) | INTRAMUSCULAR | Status: DC | PRN

## 2014-04-05 MED ORDER — CLONIDINE HCL 0.1 MG/24HR TD PTWK
0.1000 mg | MEDICATED_PATCH | TRANSDERMAL | Status: DC
Start: 2014-04-05 — End: 2014-04-06
  Administered 2014-04-05: 0.1 mg via TRANSDERMAL
  Filled 2014-04-05: qty 1

## 2014-04-05 MED ORDER — SUCRALFATE 1 GM/10ML PO SUSP
1.0000 g | Freq: Three times a day (TID) | ORAL | Status: DC
  Administered 2014-04-05 – 2014-04-06 (×2): 1 g via ORAL
  Filled 2014-04-05 (×6): qty 10

## 2014-04-05 MED ORDER — HYDROMORPHONE HCL 1 MG/ML IJ SOLN
1.0000 mg | Freq: Once | INTRAMUSCULAR | Status: AC
Start: 2014-04-05 — End: 2014-04-05
  Administered 2014-04-05: 1 mg via INTRAVENOUS
  Filled 2014-04-05: qty 1

## 2014-04-05 MED ORDER — METOCLOPRAMIDE HCL 5 MG/ML IJ SOLN
10.0000 mg | Freq: Once | INTRAMUSCULAR | Status: AC
  Administered 2014-04-05: 10 mg via INTRAVENOUS
  Filled 2014-04-05: qty 2

## 2014-04-05 MED ORDER — RIVAROXABAN 20 MG PO TABS
20.0000 mg | ORAL_TABLET | Freq: Every day | ORAL | Status: DC
  Administered 2014-04-05: 20 mg via ORAL
  Filled 2014-04-05 (×2): qty 1

## 2014-04-05 MED ORDER — METOCLOPRAMIDE HCL 5 MG/ML IJ SOLN: 5.0000 mg | Freq: Four times a day (QID) | INTRAMUSCULAR | Status: DC

## 2014-04-05 MED ORDER — ALBUTEROL SULFATE (2.5 MG/3ML) 0.083% IN NEBU: 2.5000 mg | INHALATION_SOLUTION | RESPIRATORY_TRACT | Status: DC | PRN

## 2014-04-05 MED ORDER — PANTOPRAZOLE SODIUM 40 MG IV SOLR
40.0000 mg | Freq: Once | INTRAVENOUS | Status: AC
  Administered 2014-04-05: 40 mg via INTRAVENOUS
  Filled 2014-04-05: qty 40

## 2014-04-05 MED ORDER — ONDANSETRON HCL 4 MG PO TABS: 4.0000 mg | ORAL_TABLET | Freq: Four times a day (QID) | ORAL | Status: DC | PRN

## 2014-04-05 MED ORDER — ONDANSETRON HCL 4 MG/2ML IJ SOLN
4.0000 mg | Freq: Once | INTRAMUSCULAR | Status: AC
  Administered 2014-04-05: 4 mg via INTRAVENOUS
  Filled 2014-04-05: qty 2

## 2014-04-05 MED ORDER — AMLODIPINE BESYLATE 5 MG PO TABS
5.0000 mg | ORAL_TABLET | Freq: Every day | ORAL | Status: DC
  Administered 2014-04-05: 5 mg via ORAL
  Filled 2014-04-05 (×2): qty 1

## 2014-04-05 MED ORDER — PANTOPRAZOLE SODIUM 40 MG PO TBEC
40.0000 mg | DELAYED_RELEASE_TABLET | Freq: Two times a day (BID) | ORAL | Status: DC
Start: 2014-04-05 — End: 2014-04-06
  Administered 2014-04-05: 40 mg via ORAL
  Filled 2014-04-05 (×3): qty 1

## 2014-04-05 MED ORDER — INSULIN ASPART 100 UNIT/ML ~~LOC~~ SOLN
0.0000 [IU] | Freq: Three times a day (TID) | SUBCUTANEOUS | Status: DC
Start: 2014-04-06 — End: 2014-04-06
  Administered 2014-04-06: 2 [IU] via SUBCUTANEOUS

## 2014-04-05 MED ORDER — HYDROMORPHONE HCL 1 MG/ML IJ SOLN
0.5000 mg | Freq: Once | INTRAMUSCULAR | Status: AC
  Administered 2014-04-05: 0.5 mg via INTRAVENOUS
  Filled 2014-04-05: qty 1

## 2014-04-05 MED ORDER — OXYCODONE HCL 5 MG PO TABS: 5.0000 mg | ORAL_TABLET | ORAL | Status: DC | PRN

## 2014-04-05 MED ORDER — LISINOPRIL 20 MG PO TABS
20.0000 mg | ORAL_TABLET | Freq: Every day | ORAL | Status: DC
  Administered 2014-04-05: 20 mg via ORAL
  Filled 2014-04-05 (×2): qty 1

## 2014-04-05 MED ORDER — INSULIN GLARGINE 100 UNIT/ML ~~LOC~~ SOLN
12.0000 [IU] | Freq: Two times a day (BID) | SUBCUTANEOUS | Status: DC
  Filled 2014-04-05: qty 0.12

## 2014-04-05 MED ORDER — INSULIN GLARGINE 100 UNIT/ML ~~LOC~~ SOLN
10.0000 [IU] | Freq: Two times a day (BID) | SUBCUTANEOUS | Status: DC
Start: 2014-04-05 — End: 2014-04-06
  Administered 2014-04-05: 10 [IU] via SUBCUTANEOUS
  Filled 2014-04-05 (×3): qty 0.1

## 2014-04-05 MED ORDER — ALPRAZOLAM 0.25 MG PO TABS: 0.2500 mg | ORAL_TABLET | Freq: Two times a day (BID) | ORAL | Status: DC | PRN

## 2014-04-05 MED ORDER — LEVOTHYROXINE SODIUM 50 MCG PO TABS
50.0000 ug | ORAL_TABLET | Freq: Every day | ORAL | Status: DC
  Administered 2014-04-06: 50 ug via ORAL
  Filled 2014-04-05 (×2): qty 1

## 2014-04-05 NOTE — ED Notes (Signed)
MD at bedside. 

## 2014-04-05 NOTE — H&P (Signed)
Patient Demographics  Russell [name], is a 44 y.o. male  MRN: [id number]   DOB - [birth year]  Admit Date - 04/05/2014  Outpatient Primary MD for the patient is [name], MD   With History of -  Past Medical History  Diagnosis Date  . Thyroid disease   . Diabetes mellitus without complication   . Hypertension       Past Surgical History  Procedure Laterality Date  . Right ankle fx repair    . Eye surgery    . Esophagogastroduodenoscopy N/A 03/29/2014    Procedure: ESOPHAGOGASTRODUODENOSCOPY (EGD);  Surgeon: Dorena Cookey, MD;  Location: [facility];  Service: Endoscopy;  Laterality: N/A;    in for   Chief Complaint  Patient presents with  . Emesis     HPI  Russell [name]  is a 44 y.o. male, with known past medical history of diabetes mellitus, poorly controlled, most recent A1c is before 2 weeks, with complication of neuropathy/diabetic macular degeneration, prior olecranon bursitis/gout, poorly controlled HTN, recent diagnosis? Right lower extremity cellulitis/bilateral DVT/superficial phlebitis, patient presents with complains of nausea vomiting over the last 24 hours, patient had recent admission at [facility] on 3/24 with similar complaints, he was found to be in DKA event, patient had endoscopy done showing severe erosive esophagitis, did on Protonix and Carafate, the possible diagnosis of gastroparesis, no gastric empty study done, patient reports her on out of Reglan for 24 hours, then symptoms started after that, patient was not in DKA in ED, even though had elevated glucose, no significant flaps abnormalities, lipase within normal limits.  Review of Systems    In addition to the HPI above,  No Fever-chills, No Headache, No changes with Vision or hearing, No problems swallowing food or Liquids, No Chest pain, Cough or Shortness of Breath Complains Abdominal pain, Nausea and Vommitting, Bowel movements are regular. No Blood in stool or Urine, No dysuria, No  new skin rashes or bruises, No new joints pains-aches,  No new weakness, tingling, numbness in any extremity, No recent weight gain or loss, No polyuria, polydypsia or polyphagia, No significant Mental Stressors.  A full 10 point Review of Systems was done, except as stated above, all other Review of Systems were negative.   Social History History  Substance Use Topics  . Smoking status: Never Smoker   . Smokeless tobacco: Not on file  . Alcohol Use: No     Family History Family History  Problem Relation Age of Onset  . Obesity Mother     Patient states that family members have no other medical illnesses other than what I have described     Prior to Admission medications   Medication Sig Start Date End Date Taking? Authorizing Provider  albuterol (PROVENTIL HFA;VENTOLIN HFA) 108 (90 BASE) MCG/ACT inhaler Inhale 2 puffs into the lungs every 6 (six) hours as needed for wheezing or shortness of breath. 10/22/13  Yes [name], MD  ALPRAZolam Prudy Feeler) 0.5 MG tablet TAKE 1 TABLET TWICE DAILY AS NEEDED FOR SLEEP 09/08/13  Yes [name], MD  amLODipine (NORVASC) 5 MG tablet Take 1 tablet (5 mg total) by mouth daily. 03/29/14  Yes Costin Otelia Sergeant, MD  cloNIDine (CATAPRES - DOSED IN MG/24 HR) 0.1 mg/24hr patch Place 1 patch (0.1 mg total) onto the skin once a week. 03/13/14  Yes [name], MD  cyclobenzaprine (FLEXERIL) 10 MG tablet Take 1 tablet (10 mg total) by mouth 3 (three) times daily as needed for muscle spasms.  12/28/13  Yes Thao P Le, DO  furosemide (LASIX) 40 MG tablet Take 1 tablet (40 mg total) by mouth daily. 03/13/14  Yes [name], MD  gabapentin (NEURONTIN) 600 MG tablet Take 600 mg by mouth 4 (four) times daily.  12/19/11  Yes [name], MD  HYDROcodone-acetaminophen (NORCO) 5-325 MG per tablet Take 1 tablet by mouth every 6 (six) hours as needed. Patient taking differently: Take 1 tablet by mouth every 6 (six) hours as needed for moderate pain.   03/29/14  Yes Costin Otelia Sergeant, MD  insulin glargine (LANTUS) 100 UNIT/ML injection Inject 0.6 mLs (60 Units total) into the skin 2 (two) times daily. Patient taking differently: Inject 30 Units into the skin 2 (two) times daily.  03/29/14  Yes [name], MD  Insulin Lispro, Human, 200 UNIT/ML SOPN Inject 1-50 Units into the skin daily. 1 unit per 5 grams of carbohydrates with meals/snacks plus coverage scale. Total 50 units/day 03/29/14  Yes [name], MD  ketoconazole (NIZORAL) 2 % cream Apply 1 application topically daily. 02/01/14  Yes [name], MD  levothyroxine (SYNTHROID, LEVOTHROID) 50 MCG tablet Take 1 tablet (50 mcg total) by mouth daily. 05/02/13  Yes [name], MD  lisinopril (PRINIVIL,ZESTRIL) 20 MG tablet Take 1 tablet (20 mg total) by mouth daily. 03/13/14  Yes [name], MD  metFORMIN (GLUCOPHAGE) 1000 MG tablet Take 1,000 mg by mouth 2 (two) times daily with a meal.  06/02/13 06/02/14 Yes Historical Provider, MD  metoCLOPramide (REGLAN) 5 MG tablet Take 1 tablet (5 mg total) by mouth every 8 (eight) hours as needed for nausea. 03/29/14  Yes Costin Otelia Sergeant, MD  ondansetron (ZOFRAN ODT) 4 MG disintegrating tablet Take 1 tablet (4 mg total) by mouth every 8 (eight) hours as needed for nausea or vomiting. 03/29/14  Yes Costin Otelia Sergeant, MD  pantoprazole (PROTONIX) 40 MG tablet Take 1 tablet (40 mg total) by mouth 2 (two) times daily before a meal. 03/29/14  Yes Costin Otelia Sergeant, MD  promethazine (PHENERGAN) 12.5 MG tablet Take 1 tablet (12.5 mg total) by mouth every 6 (six) hours as needed for nausea or vomiting. 03/29/14  Yes Costin Otelia Sergeant, MD  rivaroxaban (XARELTO) 20 MG TABS tablet Take 1 tablet (20 mg total) by mouth daily with supper. 03/13/14  Yes [name], MD  sucralfate (CARAFATE) 1 GM/10ML suspension Take 10 mLs (1 g total) by mouth 4 (four) times daily -  with meals and at bedtime. 03/29/14  Yes Costin Otelia Sergeant, MD    No Known Allergies  Physical  Exam  Vitals  Blood pressure 142/84, pulse 103, temperature 98.2 F (36.8 C), temperature source Oral, resp. rate 18, SpO2 97 %.   1. General  obese male lying in bed in NAD,    2. Normal affect and insight, Not Suicidal or Homicidal, Awake Alert, Oriented X 3.  3. No F.N deficits, ALL C.Nerves Intact, Strength 5/5 all 4 extremities, Sensation intact all 4 extremities, Plantars down going.  4. Ears and Eyes appear Normal, Conjunctivae clear, PERRLA. Moist Oral Mucosa.  5. Supple Neck, No JVD, No cervical lymphadenopathy appriciated, No Carotid Bruits.  6. Symmetrical Chest wall movement, Good air movement bilaterally, CTAB.  7. RRR, No Gallops, Rubs or Murmurs, No Parasternal Heave.  8. Positive Bowel Sounds, Abdomen Soft, No tenderness, No organomegaly appriciated,No rebound -guarding or rigidity.  9.  No Cyanosis, Normal Skin Turgor, No Skin Rash or Bruise.  10. Good muscle tone,  joints appear normal ,  no effusions, Normal ROM.  11. No Palpable Lymph Nodes in Neck or Axillae    Data Review  CBC  Recent Labs Lab 04/03/14 1242 04/05/14 1027  WBC 8.2 9.8  HGB 14.0* 15.9  HCT 43.5 45.1  PLT  --  285  MCV 85.0 83.7  MCH 27.4 29.5  MCHC 32.2 35.3  RDW  --  12.4  LYMPHSABS  --  1.0  MONOABS  --  0.6  EOSABS  --  0.1  BASOSABS  --  0.0   ------------------------------------------------------------------------------------------------------------------  Chemistries   Recent Labs Lab 04/03/14 1235 04/05/14 1027  NA 141 137  K 4.0 3.6  CL 104 99  CO2 27 28  GLUCOSE 154* 219*  BUN 13 16  CREATININE 0.94 0.99  CALCIUM 8.1* 8.9  AST 21 36  ALT 23 35  ALKPHOS 81 85  BILITOT 0.4 0.8   ------------------------------------------------------------------------------------------------------------------ estimated creatinine clearance is 129.1 mL/min (by C-G formula based on Cr of  0.99). ------------------------------------------------------------------------------------------------------------------ No results for input(s): TSH, T4TOTAL, T3FREE, THYROIDAB in the last 72 hours.  Invalid input(s): FREET3   Coagulation profile No results for input(s): INR, PROTIME in the last 168 hours. ------------------------------------------------------------------------------------------------------------------- No results for input(s): DDIMER in the last 72 hours. -------------------------------------------------------------------------------------------------------------------  Cardiac Enzymes No results for input(s): CKMB, TROPONINI, MYOGLOBIN in the last 168 hours.  Invalid input(s): CK ------------------------------------------------------------------------------------------------------------------ Invalid input(s): POCBNP   ---------------------------------------------------------------------------------------------------------------  Urinalysis    Component Value Date/Time   COLORURINE YELLOW 04/05/2014 1227   APPEARANCEUR CLEAR 04/05/2014 1227   LABSPEC 1.016 04/05/2014 1227   PHURINE 6.5 04/05/2014 1227   GLUCOSEU 500* 04/05/2014 1227   HGBUR MODERATE* 04/05/2014 1227   BILIRUBINUR NEGATIVE 04/05/2014 1227   BILIRUBINUR neg 04/03/2014 1244   KETONESUR NEGATIVE 04/05/2014 1227   PROTEINUR >300* 04/05/2014 1227   PROTEINUR >=300 04/03/2014 1244   UROBILINOGEN 0.2 04/05/2014 1227   UROBILINOGEN 0.2 04/03/2014 1244   NITRITE NEGATIVE 04/05/2014 1227   NITRITE neg 04/03/2014 1244   LEUKOCYTESUR NEGATIVE 04/05/2014 1227    ----------------------------------------------------------------------------------------------------------------  Imaging results:   No results found.     Assessment & Plan  Active Problems:   Type 2 diabetes mellitus-diagnosed ~2010   Hyperlipidemia   Anxiety state   DVT (deep venous thrombosis), H/o 01/2014-on Xarelto    Hypothyroidism (acquired)   Intractable nausea and vomiting  Nausea and vomiting - Possible gastroparesis, no gastric empty study done,patient reports early satiety and nausea with larger meal for some time now,  - admit to medical floor. - Will continue with Reglan is scheduled, when necessary Zofran. - will continue with Protonix and Carafate giving his recent severe erosive esophagitis. - will plan for gastric emptying study. - we'll start on full liquid diet and advance as tolerated.  Diabetes mellitus - Poorly controlled, most recent hemoglobin A1c is 10.3 - Patient reports he is on 15 units Lantus twice a day( not 60 units twice a day), taking metformin anymore, no sliding scale anymore at home. - So we'll decrease his Lantus to 12 units twice a day, continue with insulin sliding scale.  Hypertension - blood pressure acceptable,Continue with lisinopril, clonidine and Norvasc  DVT  - recently diagnosed in January - continue Xarelto   Hypothyroidism - continue Synthroid   DVT Prophylaxis on Xarelto  AM Labs Ordered, also please review Full Orders  Family Communication: Admission, patients condition and plan of care including tests being ordered have been discussed with the patient and wife who indicate understanding and agree with the plan  and Code Status.  Code Status Full  Likely DC to  Home once stable  Condition GUARDED    Time spent in minutes : 55 minutes    Atzel Mccambridge M.D on 04/05/2014 at 4:07 PM  Between 7am to 7pm - Pager - [phone]  After 7pm go to [email or web address] - password [email or web address]  And look for the night coverage person covering me after hours  [facility] Office  [phone]   **Disclaimer: This note may have been dictated with voice recognition software. Similar sounding words can inadvertently be transcribed and this note may contain transcription errors which may not have been corrected upon publication of note.**

## 2014-04-05 NOTE — ED Provider Notes (Signed)
CSN: [id number]     Arrival date & time 04/05/14  1003 History   First MD Initiated Contact with Patient 04/05/14 1014     Chief Complaint  Patient presents with  . Emesis     (Consider location/radiation/quality/duration/timing/severity/associated sxs/prior Treatment) HPI   44yM with epigastric pain and n/v. Recent admit for DKA. In hospital had EGD which showed severe erosive esophagitis. Entertained possible gastroparesis. Has had continued nausea and some abdominal pain. Was discharged with reglan which was initially taking scheduled, but PCP felt might be better taking PRN zofran and phenergan when he was seen in follow-up. He had been having persistent nausea since he was discharged but felt it was manageable when on reglan. In past day has had severe n/v and uncontrolled with phenergan/zofran since this morning. Epigastric pain. Does not radiate. No fever or chills. No urinary complaints. No diarrhea. No sick contacts. Blood sugars this today have been in 100s.    Past Medical History  Diagnosis Date  . Thyroid disease   . Diabetes mellitus without complication   . Hypertension    Past Surgical History  Procedure Laterality Date  . Right ankle fx repair    . Eye surgery    . Esophagogastroduodenoscopy N/A 03/29/2014    Procedure: ESOPHAGOGASTRODUODENOSCOPY (EGD);  Surgeon: Dorena CookeyJohn Hayes, MD;  Location: [facility];  Service: Endoscopy;  Laterality: N/A;   Family History  Problem Relation Age of Onset  . Obesity Mother     Patient states that family members have no other medical illnesses other than what I have described   History  Substance Use Topics  . Smoking status: Never Smoker   . Smokeless tobacco: Not on file  . Alcohol Use: No    Review of Systems  All systems reviewed and negative, other than as noted in HPI.   Allergies  Review of patient's allergies indicates no known allergies.  Home Medications   Prior to Admission medications   Medication Sig Start  Date End Date Taking? Authorizing Provider  albuterol (PROVENTIL HFA;VENTOLIN HFA) 108 (90 BASE) MCG/ACT inhaler Inhale 2 puffs into the lungs every 6 (six) hours as needed for wheezing or shortness of breath. 10/22/13  Yes [name], MD  ALPRAZolam Prudy Feeler(XANAX) 0.5 MG tablet TAKE 1 TABLET TWICE DAILY AS NEEDED FOR SLEEP 09/08/13  Yes [name], MD  amLODipine (NORVASC) 5 MG tablet Take 1 tablet (5 mg total) by mouth daily. 03/29/14  Yes Costin Otelia SergeantM Gherghe, MD  cloNIDine (CATAPRES - DOSED IN MG/24 HR) 0.1 mg/24hr patch Place 1 patch (0.1 mg total) onto the skin once a week. 03/13/14  Yes [name], MD  cyclobenzaprine (FLEXERIL) 10 MG tablet Take 1 tablet (10 mg total) by mouth 3 (three) times daily as needed for muscle spasms. 12/28/13  Yes Thao P Le, DO  furosemide (LASIX) 40 MG tablet Take 1 tablet (40 mg total) by mouth daily. 03/13/14  Yes [name], MD  gabapentin (NEURONTIN) 600 MG tablet Take 600 mg by mouth 4 (four) times daily.  12/19/11  Yes [name], MD  HYDROcodone-acetaminophen (NORCO) 5-325 MG per tablet Take 1 tablet by mouth every 6 (six) hours as needed. Patient taking differently: Take 1 tablet by mouth every 6 (six) hours as needed for moderate pain.  03/29/14  Yes Costin Otelia SergeantM Gherghe, MD  insulin glargine (LANTUS) 100 UNIT/ML injection Inject 0.6 mLs (60 Units total) into the skin 2 (two) times daily. Patient taking differently: Inject 30 Units into the skin 2 (two) times  daily.  03/29/14  Yes [name], MD  Insulin Lispro, Human, 200 UNIT/ML SOPN Inject 1-50 Units into the skin daily. 1 unit per 5 grams of carbohydrates with meals/snacks plus coverage scale. Total 50 units/day 03/29/14  Yes [name], MD  ketoconazole (NIZORAL) 2 % cream Apply 1 application topically daily. 02/01/14  Yes [name], MD  levothyroxine (SYNTHROID, LEVOTHROID) 50 MCG tablet Take 1 tablet (50 mcg total) by mouth daily. 05/02/13  Yes [name], MD  lisinopril  (PRINIVIL,ZESTRIL) 20 MG tablet Take 1 tablet (20 mg total) by mouth daily. 03/13/14  Yes [name], MD  metFORMIN (GLUCOPHAGE) 1000 MG tablet Take 1,000 mg by mouth 2 (two) times daily with a meal.  06/02/13 06/02/14 Yes Historical Provider, MD  metoCLOPramide (REGLAN) 5 MG tablet Take 1 tablet (5 mg total) by mouth every 8 (eight) hours as needed for nausea. 03/29/14  Yes Costin Otelia Sergeant, MD  ondansetron (ZOFRAN ODT) 4 MG disintegrating tablet Take 1 tablet (4 mg total) by mouth every 8 (eight) hours as needed for nausea or vomiting. 03/29/14  Yes Costin Otelia Sergeant, MD  pantoprazole (PROTONIX) 40 MG tablet Take 1 tablet (40 mg total) by mouth 2 (two) times daily before a meal. 03/29/14  Yes Costin Otelia Sergeant, MD  promethazine (PHENERGAN) 12.5 MG tablet Take 1 tablet (12.5 mg total) by mouth every 6 (six) hours as needed for nausea or vomiting. 03/29/14  Yes Costin Otelia Sergeant, MD  rivaroxaban (XARELTO) 20 MG TABS tablet Take 1 tablet (20 mg total) by mouth daily with supper. 03/13/14  Yes [name], MD  sucralfate (CARAFATE) 1 GM/10ML suspension Take 10 mLs (1 g total) by mouth 4 (four) times daily -  with meals and at bedtime. 03/29/14  Yes Costin Otelia Sergeant, MD   BP 133/88 mmHg  Pulse 105  Temp(Src) 98.2 F (36.8 C) (Oral)  Resp 18  SpO2 97% Physical Exam  Constitutional: He appears well-developed and well-nourished. No distress.  HENT:  Head: Normocephalic and atraumatic.  Eyes: Conjunctivae are normal. Right eye exhibits no discharge. Left eye exhibits no discharge.  Neck: Neck supple.  Cardiovascular: Normal rate, regular rhythm and normal heart sounds.  Exam reveals no gallop and no friction rub.   No murmur heard. Pulmonary/Chest: Effort normal and breath sounds normal. No respiratory distress.  Abdominal: Soft. He exhibits no distension. There is tenderness.  Epigastric tenderness w/o rebound or guarding  Musculoskeletal: He exhibits no edema or tenderness.  Neurological: He is  alert.  Skin: Skin is warm and dry.  Psychiatric: He has a normal mood and affect. His behavior is normal. Thought content normal.  Nursing note and vitals reviewed.   ED Course  Procedures (including critical care time) Labs Review Labs Reviewed  CBC WITH DIFFERENTIAL/PLATELET - Abnormal; Notable for the following:    Neutrophils Relative % 82 (*)    Neutro Abs 8.0 (*)    Lymphocytes Relative 10 (*)    All other components within normal limits  COMPREHENSIVE METABOLIC PANEL - Abnormal; Notable for the following:    Glucose, Bld 219 (*)    Albumin 2.8 (*)    All other components within normal limits  URINALYSIS, ROUTINE W REFLEX MICROSCOPIC - Abnormal; Notable for the following:    Glucose, UA 500 (*)    Hgb urine dipstick MODERATE (*)    Protein, ur >300 (*)    All other components within normal limits  LIPASE, BLOOD  URINE MICROSCOPIC-ADD ON  Rosezena Sensor, ED  Imaging Review No results found.   EKG Interpretation None      MDM   Final diagnoses:  Intractable vomiting with nausea, vomiting of unspecified type    44yM with abdominal pain and n/v. Recent admit for DKA. Currently no anion gap, normal bicarb, no ketones on UA. Symptoms remain poorly controlled despite reglan, zofran and trying low dose haldol. Will discuss with medicine for possible admission.    Raeford Razor, MD 04/05/14 1556

## 2014-04-05 NOTE — ED Notes (Signed)
Pt given an urinal and made aware of need for urine specimen. 

## 2014-04-05 NOTE — ED Notes (Signed)
Pt from home c/o vomiting since this a.m. Has been seen at PCP yesterday. Recent admission for same. Hx of DM with ketoacidosis. Pt actively vomiting during triage assessment.

## 2014-04-06 ENCOUNTER — Inpatient Hospital Stay (HOSPITAL_COMMUNITY): Payer: Managed Care, Other (non HMO)

## 2014-04-06 DIAGNOSIS — I82409 Acute embolism and thrombosis of unspecified deep veins of unspecified lower extremity: Secondary | ICD-10-CM | POA: Diagnosis not present

## 2014-04-06 DIAGNOSIS — R110 Nausea: Secondary | ICD-10-CM

## 2014-04-06 LAB — GLUCOSE, CAPILLARY
GLUCOSE-CAPILLARY: 149 mg/dL — AB (ref 70–99)
Glucose-Capillary: 149 mg/dL — ABNORMAL HIGH (ref 70–99)

## 2014-04-06 LAB — CBC
HCT: 37.8 % — ABNORMAL LOW (ref 39.0–52.0)
Hemoglobin: 12.7 g/dL — ABNORMAL LOW (ref 13.0–17.0)
MCH: 28.4 pg (ref 26.0–34.0)
MCHC: 33.6 g/dL (ref 30.0–36.0)
MCV: 84.6 fL (ref 78.0–100.0)
Platelets: 224 10*3/uL (ref 150–400)
RBC: 4.47 MIL/uL (ref 4.22–5.81)
RDW: 12.5 % (ref 11.5–15.5)
WBC: 6 10*3/uL (ref 4.0–10.5)

## 2014-04-06 LAB — BASIC METABOLIC PANEL
ANION GAP: 6 (ref 5–15)
BUN: 10 mg/dL (ref 6–23)
CALCIUM: 8 mg/dL — AB (ref 8.4–10.5)
CO2: 28 mmol/L (ref 19–32)
Chloride: 106 mmol/L (ref 96–112)
Creatinine, Ser: 0.95 mg/dL (ref 0.50–1.35)
GFR calc Af Amer: >90 mL/min (ref >90)
GFR calc non Af Amer: >90 mL/min (ref >90)
Glucose, Bld: 148 mg/dL — ABNORMAL HIGH (ref 70–99)
POTASSIUM: 3.2 mmol/L — AB (ref 3.5–5.1)
SODIUM: 140 mmol/L (ref 135–145)

## 2014-04-06 MED ORDER — ACETAMINOPHEN 325 MG PO TABS
650.0000 mg | ORAL_TABLET | ORAL | Status: DC | PRN
  Administered 2014-04-06: 650 mg via ORAL
  Filled 2014-04-06: qty 2

## 2014-04-06 MED ORDER — FLUDEOXYGLUCOSE F - 18 (FDG) INJECTION: 2.2000 | Freq: Once | INTRAVENOUS | Status: DC | PRN

## 2014-04-06 MED ORDER — FUROSEMIDE 40 MG PO TABS: 40.0000 mg | ORAL_TABLET | Freq: Every day | ORAL | Status: DC

## 2014-04-06 MED ORDER — POTASSIUM CHLORIDE CRYS ER 20 MEQ PO TBCR: 40.0000 meq | EXTENDED_RELEASE_TABLET | ORAL | Status: AC

## 2014-04-06 MED ORDER — INSULIN GLARGINE 100 UNIT/ML ~~LOC~~ SOLN: 15.0000 [IU] | Freq: Two times a day (BID) | SUBCUTANEOUS | Status: DC

## 2014-04-06 MED ORDER — TECHNETIUM TC 99M SULFUR COLLOID
2.2000 | Freq: Once | INTRAVENOUS | Status: AC | PRN
  Administered 2014-04-06: 2.2 via INTRAVENOUS

## 2014-04-06 MED ORDER — METOCLOPRAMIDE HCL 5 MG PO TABS: 5.0000 mg | ORAL_TABLET | Freq: Three times a day (TID) | ORAL | Status: DC | PRN

## 2014-04-06 NOTE — Discharge Instructions (Signed)
Follow with Primary MD Elvina SidleLAUENSTEIN,KURT, MD in 7 days   Get CBC, CMP, 2 view Chest X ray checked  by Primary MD next visit.    Activity: As tolerated with Full fall precautions use walker/cane & assistance as needed   Disposition Home    Diet: Heart Healthy , carbohydrate modified, small frequent portions , with feeding assistance and aspiration precautions as needed.  For Heart failure patients - Check your Weight same time everyday, if you gain over 2 pounds, or you develop in leg swelling, experience more shortness of breath or chest pain, call your Primary MD immediately. Follow Cardiac Low Salt Diet and 1.5 lit/day fluid restriction.   On your next visit with your primary care physician please Get Medicines reviewed and adjusted.   Please request your Prim.MD to go over all Hospital Tests and Procedure/Radiological results at the follow up, please get all Hospital records sent to your Prim MD by signing hospital release before you go home.   If you experience worsening of your admission symptoms, develop shortness of breath, life threatening emergency, suicidal or homicidal thoughts you must seek medical attention immediately by calling 911 or calling your MD immediately  if symptoms less severe.  You Must read complete instructions/literature along with all the possible adverse reactions/side effects for all the Medicines you take and that have been prescribed to you. Take any new Medicines after you have completely understood and accpet all the possible adverse reactions/side effects.   Do not drive, operating heavy machinery, perform activities at heights, swimming or participation in water activities or provide baby sitting services if your were admitted for syncope or siezures until you have seen by Primary MD or a Neurologist and advised to do so again.  Do not drive when taking Pain medications.    Do not take more than prescribed Pain, Sleep and Anxiety  Medications  Special Instructions: If you have smoked or chewed Tobacco  in the last 2 yrs please stop smoking, stop any regular Alcohol  and or any Recreational drug use.  Wear Seat belts while driving.   Please note  You were cared for by a hospitalist during your hospital stay. If you have any questions about your discharge medications or the care you received while you were in the hospital after you are discharged, you can call the unit and asked to speak with the hospitalist on call if the hospitalist that took care of you is not available. Once you are discharged, your primary care physician will handle any further medical issues. Please note that NO REFILLS for any discharge medications will be authorized once you are discharged, as it is imperative that you return to your primary care physician (or establish a relationship with a primary care physician if you do not have one) for your aftercare needs so that they can reassess your need for medications and monitor your lab values.

## 2014-04-06 NOTE — Discharge Summary (Signed)
Russell [name], 44 y.o., DOB 05-31-1970, MRN [id number]. Admission date: 04/05/2014 Discharge Date 04/06/2014 Primary MD Elvina SidleLAUENSTEIN,KURT, MD Admitting Physician Russell Armsawood S Elgergawy, MD   PCP please follow-up on: - Check regular labs including CBC, BMP during next visit.  Admission Diagnosis  Intractable vomiting with nausea, vomiting of unspecified type [R11.10]  Discharge Diagnosis   Active Problems:   Type 2 diabetes mellitus-diagnosed ~2010   Hyperlipidemia   Anxiety state   DVT (deep venous thrombosis), H/o 01/2014-on Xarelto   Hypothyroidism (acquired)   Intractable nausea and vomiting   Nausea      Past Medical History  Diagnosis Date  . Thyroid disease   . Diabetes mellitus without complication   . Hypertension     Past Surgical History  Procedure Laterality Date  . Right ankle fx repair    . Eye surgery    . Esophagogastroduodenoscopy N/A 03/29/2014    Procedure: ESOPHAGOGASTRODUODENOSCOPY (EGD);  Surgeon: Dorena CookeyJohn Hayes, MD;  [facility];  Service: Endoscopy;  Laterality: N/A;     Hospital Course See H&P, Labs, Consult and Test reports for all details in brief, patient was admitted for **  Active Problems:   Type 2 diabetes mellitus-diagnosed ~2010   Hyperlipidemia   Anxiety state   DVT (deep venous thrombosis), H/o 01/2014-on Xarelto   Hypothyroidism (acquired)   Intractable nausea and vomiting   Nausea  Kerry FortSteven [name] is a 44 y.o. male, with known past medical history of diabetes mellitus, poorly controlled, most recent A1c is before 2 weeks, with complication of neuropathy/diabetic macular degeneration, prior olecranon bursitis/gout, poorly controlled HTN, recent diagnosis? Right lower extremity cellulitis/bilateral DVT/superficial phlebitis, patient presents with complains of nausea vomiting over the last 24 hours, patient had [facility]/24 with similar complaints, he was found to be in DKA event, patient had endoscopy done showing  severe erosive esophagitis, did on Protonix and Carafate, had possible diagnosis of gastroparesis, no gastric empty study done, patient reports her on out of Reglan for 24 hours, then symptoms started after that, patient was not in DKA in ED, even though had elevated glucose, no significant labs abnormalities, lipase within normal limits. - Patient was admitted to medical floor, started on full liquid diet, which he tolerated very well, patient had gastric empty study done on 4/4, which was normal gastric empty study, patient had a soft diet, which he tolerated very well, no nausea no vomiting, request to be discharged.  Nausea and vomiting - Unclear which urology, normal gastric empty study. - Tolerated soft diet, no further nausea or vomiting. - Started to continue with Reglan and Zofran only on as needed basis - will continue with Protonix and Carafate giving his recent severe erosive esophagitis.  Diabetes mellitus - Poorly controlled, most recent hemoglobin A1c is 10.3 - Patient reports he is on 15 units Lantus twice a day( not 60 units twice a day), not taking metformin anymore. - We'll discharge home on 15 units Lantus twice a day, and his home dose insulin sliding-scale.  Hypertension - Continue with lisinopril, clonidine and Norvasc  DVT  - recently diagnosed in January - continue Xarelto   Hypothyroidism - continue Synthroid  Consults   None  Significant Tests:  See full reports for all details    Dg Chest 2 View  03/24/2014   CLINICAL DATA:  44 year old male with nausea vomiting for 2 days. Hyperglycemia. Leukocytosis. Initial encounter.  EXAM: CHEST  2 VIEW  COMPARISON:  12/28/2013.  FINDINGS: Semi upright  AP and lateral views of the chest at 1332 hours. Lower lung volumes. Normal cardiac size and mediastinal contours. Visualized tracheal air column is within normal limits. No pneumothorax, pulmonary edema, pleural effusion or consolidation. Crowding of markings at the lung  bases. No acute osseous abnormality identified.  IMPRESSION: Low lung volumes with mild bibasilar opacity most resembling atelectasis.   Electronically Signed   By: Odessa Fleming M.D.   On: 03/24/2014 14:09   Nm Gastric Emptying  04/06/2014   CLINICAL DATA:  Abdominal pain and nausea and vomiting for 1 week; history of diabetes  EXAM: NUCLEAR MEDICINE GASTRIC EMPTYING SCAN  TECHNIQUE: After oral ingestion of radiolabeled meal, sequential abdominal images were obtained for 4 hours. Percentage of activity emptying the stomach calculated at 1 hour, 2 hour, 3 hour, and 4 hours.  RADIOPHARMACEUTICALS:  2.2 Technetium 99-m labeled sulfur colloid  COMPARISON:  None.  FINDINGS: Expected location of the stomach in the left upper quadrant. Ingested meal empties the stomach gradually over the course of the study.  27% emptied at 1 hr ( normal >= 10%)  61% emptied at 2 hr ( normal >= 40%)  78% emptied at 3 hr ( normal >= 70%)  96% emptied at 4 hr ( normal >= 90%)  IMPRESSION: Normal gastric emptying study.   Electronically Signed   By: David  [place]   On: 04/06/2014 15:55   US Abdomen Limited Ruq  03/27/2014   CLINICAL DATA:  Pain  EXAM: US ABDOMEN LIMITED - RIGHT UPPER QUADRANT  COMPARISON:  None.  FINDINGS: Gallbladder:  No gallstones or wall thickening visualized. No sonographic Murphy sign noted.  Common bile duct:  Diameter: 5.4 mm in diameter within normal limits.  Liver:  No focal lesion identified. Within normal limits in parenchymal echogenicity.  IMPRESSION: Unremarkable right upper quadrant ultrasound.   Electronically Signed   By: Natasha Mead M.D.   On: 03/27/2014 22:15     Today   Subjective:   Russell [name] today has no headache,no chest abdominal pain,no new weakness tingling or numbness, feels much better wants to go home today. Tolerated soft diet .  Objective:   Blood pressure 170/103, pulse 99, temperature 98 F (36.7 C), temperature source Oral, resp. rate 18, height  (1.88 m), weight 117 kg  (257 lb 15 oz), SpO2 97 %.  Intake/Output Summary (Last 24 hours) at 04/06/14 1644 Last data filed at 04/06/14 0900  Gross per 24 hour  Intake    360 ml  Output    600 ml  Net   -240 ml    Exam Awake Alert, Oriented *3, No new F.N deficits, Normal affect Dale.AT,PERRAL Supple Neck,No JVD, No cervical lymphadenopathy appriciated.  Symmetrical Chest wall movement, Good air movement bilaterally, CTAB RRR,No Gallops,Rubs or new Murmurs, No Parasternal Heave +ve B.Sounds, Abd Soft, Non tender, No organomegaly appriciated, No rebound -guarding or rigidity. No Cyanosis, Clubbing or edema, No new Rash or bruise  Data Review   Cultures -   CBC w Diff: Lab Results  Component Value Date   WBC 6.0 04/06/2014   WBC 8.2 04/03/2014   HGB 12.7* 04/06/2014   HGB 14.0* 04/03/2014   HCT 37.8* 04/06/2014   HCT 43.5 04/03/2014   PLT 224 04/06/2014   LYMPHOPCT 10* 04/05/2014   MONOPCT 7 04/05/2014   EOSPCT 1 04/05/2014   BASOPCT 0 04/05/2014   CMP: Lab Results  Component Value Date   NA 140 04/06/2014   K 3.2* 04/06/2014   CL  106 04/06/2014   CO2 28 04/06/2014   BUN 10 04/06/2014   CREATININE 0.95 04/06/2014   CREATININE 0.94 04/03/2014   PROT 6.6 04/05/2014   ALBUMIN 2.8* 04/05/2014   BILITOT 0.8 04/05/2014   ALKPHOS 85 04/05/2014   AST 36 04/05/2014   ALT 35 04/05/2014  .  Micro Results No results found for this or any previous visit (from the past 240 hour(s)).   Discharge Instructions      Follow-up Information    Follow up with [name], MD. Schedule an appointment as soon as possible for a visit in 1 week.   Specialty:  Family Medicine   Why:  Posthospitalization follow-up   Contact information:   [address] [place] [address] [phone]       Discharge Medications     Medication List    STOP taking these medications        cyclobenzaprine 10 MG tablet  Commonly known as:  FLEXERIL     metFORMIN 1000 MG tablet  Commonly known as:   GLUCOPHAGE      TAKE these medications        albuterol 108 (90 BASE) MCG/ACT inhaler  Commonly known as:  PROVENTIL HFA;VENTOLIN HFA  Inhale 2 puffs into the lungs every 6 (six) hours as needed for wheezing or shortness of breath.     ALPRAZolam 0.5 MG tablet  Commonly known as:  XANAX  TAKE 1 TABLET TWICE DAILY AS NEEDED FOR SLEEP     amLODipine 5 MG tablet  Commonly known as:  NORVASC  Take 1 tablet (5 mg total) by mouth daily.     cloNIDine 0.1 mg/24hr patch  Commonly known as:  CATAPRES - Dosed in mg/24 hr  Place 1 patch (0.1 mg total) onto the skin once a week.     furosemide 40 MG tablet  Commonly known as:  LASIX  Take 1 tablet (40 mg total) by mouth daily. Hold Lasix for that day, if poor appetite or oral intake     gabapentin 600 MG tablet  Commonly known as:  NEURONTIN  Take 600 mg by mouth 4 (four) times daily.     HYDROcodone-acetaminophen 5-325 MG per tablet  Commonly known as:  NORCO  Take 1 tablet by mouth every 6 (six) hours as needed.     insulin glargine 100 UNIT/ML injection  Commonly known as:  LANTUS  Inject 0.15 mLs (15 Units total) into the skin 2 (two) times daily.     Insulin Lispro (Human) 200 UNIT/ML Sopn  Inject 1-50 Units into the skin daily. 1 unit per 5 grams of carbohydrates with meals/snacks plus coverage scale. Total 50 units/day     levothyroxine 50 MCG tablet  Commonly known as:  SYNTHROID, LEVOTHROID  Take 1 tablet (50 mcg total) by mouth daily.     lisinopril 20 MG tablet  Commonly known as:  PRINIVIL,ZESTRIL  Take 1 tablet (20 mg total) by mouth daily.     metoCLOPramide 5 MG tablet  Commonly known as:  REGLAN  Take 1 tablet (5 mg total) by mouth every 8 (eight) hours as needed for nausea.     ondansetron 4 MG disintegrating tablet  Commonly known as:  ZOFRAN ODT  Take 1 tablet (4 mg total) by mouth every 8 (eight) hours as needed for nausea or vomiting.     pantoprazole 40 MG tablet  Commonly known as:  PROTONIX  Take  1 tablet (40 mg total) by mouth 2 (two) times daily  before a meal.     promethazine 12.5 MG tablet  Commonly known as:  PHENERGAN  Take 1 tablet (12.5 mg total) by mouth every 6 (six) hours as needed for nausea or vomiting.     rivaroxaban 20 MG Tabs tablet  Commonly known as:  XARELTO  Take 1 tablet (20 mg total) by mouth daily with supper.     sucralfate 1 GM/10ML suspension  Commonly known as:  CARAFATE  Take 10 mLs (1 g total) by mouth 4 (four) times daily -  with meals and at bedtime.         Total Time in preparing paper work, data evaluation and todays exam - 35 minutes  [name], DAWOOD M.D on 04/06/2014 at 4:44 PM  [facility] Office  [phone]

## 2014-04-10 ENCOUNTER — Ambulatory Visit (INDEPENDENT_AMBULATORY_CARE_PROVIDER_SITE_OTHER): Payer: Managed Care, Other (non HMO) | Admitting: Family Medicine

## 2014-04-10 ENCOUNTER — Ambulatory Visit (INDEPENDENT_AMBULATORY_CARE_PROVIDER_SITE_OTHER): Payer: Managed Care, Other (non HMO)

## 2014-04-10 VITALS — BP 134/88 | HR 104 | Temp 97.6°F | Resp 16 | Ht 72.0 in | Wt 263.4 lb

## 2014-04-10 DIAGNOSIS — E11359 Type 2 diabetes mellitus with proliferative diabetic retinopathy without macular edema: Secondary | ICD-10-CM

## 2014-04-10 DIAGNOSIS — R1115 Cyclical vomiting syndrome unrelated to migraine: Secondary | ICD-10-CM

## 2014-04-10 DIAGNOSIS — R600 Localized edema: Secondary | ICD-10-CM

## 2014-04-10 DIAGNOSIS — G43A0 Cyclical vomiting, not intractable: Secondary | ICD-10-CM

## 2014-04-10 DIAGNOSIS — F4323 Adjustment disorder with mixed anxiety and depressed mood: Secondary | ICD-10-CM

## 2014-04-10 DIAGNOSIS — E039 Hypothyroidism, unspecified: Secondary | ICD-10-CM

## 2014-04-10 DIAGNOSIS — E113599 Type 2 diabetes mellitus with proliferative diabetic retinopathy without macular edema, unspecified eye: Secondary | ICD-10-CM

## 2014-04-10 LAB — POCT URINALYSIS DIPSTICK
Bilirubin, UA: NEGATIVE
Glucose, UA: 250
Ketones, UA: NEGATIVE
Leukocytes, UA: NEGATIVE
Nitrite, UA: NEGATIVE
Protein, UA: >=300
Spec Grav, UA: 1.025
Urobilinogen, UA: 0.2
pH, UA: 6

## 2014-04-10 LAB — POCT CBC
Granulocyte percent: 69.1 %G (ref 37–80)
HCT, POC: 41.7 % — AB (ref 43.5–53.7)
Hemoglobin: 13.7 g/dL — AB (ref 14.1–18.1)
Lymph, poc: 1.4 (ref 0.6–3.4)
MCH, POC: 28.1 pg (ref 27–31.2)
MCHC: 32.8 g/dL (ref 31.8–35.4)
MCV: 85.7 fL (ref 80–97)
MID (cbc): 0.3 (ref 0–0.9)
MPV: 7.3 fL (ref 0–99.8)
POC Granulocyte: 3.7 (ref 2–6.9)
POC LYMPH PERCENT: 25.6 %L (ref 10–50)
POC MID %: 5.3 %M (ref 0–12)
Platelet Count, POC: 287 10*3/uL (ref 142–424)
RBC: 4.87 M/uL (ref 4.69–6.13)
RDW, POC: 13.1 %
WBC: 5.3 10*3/uL (ref 4.6–10.2)

## 2014-04-10 LAB — COMPREHENSIVE METABOLIC PANEL
ALT: 31 U/L (ref 0–53)
AST: 33 U/L (ref 0–37)
Albumin: 2.8 g/dL — ABNORMAL LOW (ref 3.5–5.2)
Alkaline Phosphatase: 89 U/L (ref 39–117)
BUN: 15 mg/dL (ref 6–23)
CO2: 31 mEq/L (ref 19–32)
Calcium: 8.2 mg/dL — ABNORMAL LOW (ref 8.4–10.5)
Chloride: 103 mEq/L (ref 96–112)
Creat: 0.98 mg/dL (ref 0.50–1.35)
Glucose, Bld: 210 mg/dL — ABNORMAL HIGH (ref 70–99)
Potassium: 4.2 mEq/L (ref 3.5–5.3)
Sodium: 140 mEq/L (ref 135–145)
Total Bilirubin: 0.4 mg/dL (ref 0.2–1.2)
Total Protein: 5.3 g/dL — ABNORMAL LOW (ref 6.0–8.3)

## 2014-04-10 LAB — CORTISOL: Cortisol, Plasma: 6.9 ug/dL

## 2014-04-10 LAB — PROLACTIN: Prolactin: 6 ng/mL (ref 2.1–17.1)

## 2014-04-10 LAB — FOLLICLE STIMULATING HORMONE: FSH: 9.2 m[IU]/mL (ref 1.4–18.1)

## 2014-04-10 LAB — TSH: TSH: 3.492 u[IU]/mL (ref 0.350–4.500)

## 2014-04-10 MED ORDER — TORSEMIDE 10 MG PO TABS: 10.0000 mg | ORAL_TABLET | Freq: Every day | ORAL | Status: DC

## 2014-04-10 MED ORDER — CITALOPRAM HYDROBROMIDE 20 MG PO TABS: 20.0000 mg | ORAL_TABLET | Freq: Every day | ORAL | Status: DC

## 2014-04-10 NOTE — Progress Notes (Signed)
Subjective:  This chart was scribed for Russell Sidle, MD by Carl Best, Medical Scribe. This patient was seen in Room 3 and the patient's care was started at 10:24 AM.   Patient ID: Russell [name], male    DOB: [birth year], 44 y.o.   MRN: [id number]  HPI HPI Comments: Russell [name] is a 44 y.o. male with a history of DM who presents to the [facility] for lab work.  He states that he had a esophagogastroduodenoscopy to test if he has gastroenteritis but states that the test came back negative.  Five days ago he started experiencing severe nausea, vomiting, and a low grade fever and went to the ED for treatment.  He was treated with IV fluids and Phenergan but there was no definitive diagnosis made at that time.  He was discharged with instructions to have a chest xray and follow-up lab work to further investigate his symptoms.  He denies hematemesis at that time.  He is no longer taking Reglan, albuterol inhaler, Hydrocodone, or Protonix as prescribed because it did not help alleviate his symptoms.  He is taking Carafate as prescribed.  He has an appointment with a GI specialist on July 19.  He lists increased agitation as an associated symptom and states that it does not take much to make him angry.  He is currently experiencing bilateral leg swelling but is not complaining of any leg pain.  He did not have a venous doppler done in the ED during his visit.  He has been taking Lasix as prescribed with no relief to his leg swelling.  He lists numbness in his feet bilaterally as an associated symptom.    He was diagnosed with DM 5-6 years ago.  His sugar was 199 when he checked his levels this morning.    He is receiving disability benefits.    Past Medical History  Diagnosis Date   Thyroid disease    Diabetes mellitus without complication    Hypertension    Past Surgical History  Procedure Laterality Date   Right ankle fx repair     Eye surgery      Esophagogastroduodenoscopy N/A 03/29/2014    Procedure: ESOPHAGOGASTRODUODENOSCOPY (EGD);  Surgeon: Dorena Cookey, MD;  Location: [facility];  Service: Endoscopy;  Laterality: N/A;   Family History  Problem Relation Age of Onset   Obesity Mother     Patient states that family members have no other medical illnesses other than what I have described   History   Social History   Marital Status: Single    Spouse Name: N/A   Number of Children: N/A   Years of Education: N/A   Occupational History   Not on file.   Social History Main Topics   Smoking status: Never Smoker    Smokeless tobacco: Not on file   Alcohol Use: No   Drug Use: No   Sexual Activity: Yes   Other Topics Concern   Not on file   Social History Narrative   Patient currently lives with his fiance   Works with facilities   Nonsmoker, nondrinker      No Known Allergies  Review of Systems  Constitutional: Negative for fever.  Cardiovascular: Positive for leg swelling.  Gastrointestinal: Negative for nausea and vomiting.  Musculoskeletal: Negative for arthralgias.  Neurological: Positive for numbness.  Psychiatric/Behavioral: Positive for agitation.    Objective:  Physical Exam  Constitutional: He is oriented to person, place, and time. He appears  well-developed and well-nourished.  HENT:  Head: Normocephalic and atraumatic.  Eyes: EOM are normal.  Neck: Normal range of motion.  Cardiovascular: Normal rate, regular rhythm and normal heart sounds.  Exam reveals no gallop and no friction rub.   No murmur heard. Pulmonary/Chest: Effort normal and breath sounds normal. No respiratory distress. He has no wheezes. He has no rales.  Abdominal: Soft. There is no tenderness.  Musculoskeletal: Normal range of motion.  Neurological: He is alert and oriented to person, place, and time.  Skin: Skin is warm and dry.  Psychiatric: He has a normal mood and affect. His behavior is normal.  Nursing note and  vitals reviewed.  Blood glucose in exam room - 266  Results for orders placed or performed in visit on 04/10/14  POCT CBC  Result Value Ref Range   WBC 5.3 4.6 - 10.2 K/uL   Lymph, poc 1.4 0.6 - 3.4   POC LYMPH PERCENT 25.6 10 - 50 %L   MID (cbc) 0.3 0 - 0.9   POC MID % 5.3 0 - 12 %M   POC Granulocyte 3.7 2 - 6.9   Granulocyte percent 69.1 37 - 80 %G   RBC 4.87 4.69 - 6.13 M/uL   Hemoglobin 13.7 (A) 14.1 - 18.1 g/dL   HCT, POC 16.141.7 (A) 09.643.5 - 53.7 %   MCV 85.7 80 - 97 fL   MCH, POC 28.1 27 - 31.2 pg   MCHC 32.8 31.8 - 35.4 g/dL   RDW, POC 04.513.1 %   Platelet Count, POC 287 142 - 424 K/uL   MPV 7.3 0 - 99.8 fL  POCT urinalysis dipstick  Result Value Ref Range   Color, UA yellow    Clarity, UA clear    Glucose, UA 250    Bilirubin, UA neg    Ketones, UA neg    Spec Grav, UA 1.025    Blood, UA moderate    pH, UA 6.0    Protein, UA >=300    Urobilinogen, UA 0.2    Nitrite, UA neg    Leukocytes, UA Negative    [facility] reading (PRIMARY) by  Dr. Milus GlazierLauenstein chest x-ray shows a right upper lobe air bronchogram.    BP 134/88 mmHg   Pulse 104   Temp(Src) 97.6 F (36.4 C) (Oral)   Resp 16   Ht 6' (1.829 m)   Wt 263 lb 6.4 oz (119.477 kg)   BMI 35.72 kg/m2   SpO2 95% Assessment & Plan:    Caring for Russell [name] been very frustrating. I've asked for help from the hospitalist to get him hooked up with an endocrinologist and the response is been to do nothing about this and instead asked me to do chest x-rays and a Friday labs they could've been done in the hospital.  The patient continues to have brittle diabetes and he does need an endocrinologist to help sort out what the best treatment for this. Also we don't have an explanation for these recurrent episodes of vomiting and the rapid progression of end organ disease.  I am going to try to arrange for a in-hospital evaluation for his diabetes.  This chart was scribed in my presence and reviewed by me personally.    ICD-9-CM  ICD-10-CM   1. 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 Non-intractable cyclical vomiting with nausea 536.2 G43.A0 PTH, Intact and Calcium  3. Pedal edema 782.3 R60.0 torsemide (DEMADEX) 10 MG tablet     DG Chest 2 View     DG Chest 2 View  4. Hypothyroidism, unspecified hypothyroidism type 244.9 E03.9 TSH  5. Adjustment disorder with mixed anxiety and depressed mood 309.28 F43.23 citalopram (CELEXA) 20 MG tablet     Signed, Russell Sidle, MD

## 2014-04-10 NOTE — Patient Instructions (Addendum)
Change basal insulin to 20 units in am and 10 units in the evening. Out of work another week.  I will do my best to make some calls to try to get rehabilitation to admit you for a few days to follow the sugar. Given number of laboratory tests are pending and hopefully will shed some light on the fluctuations in your sugar. I've changed your diuretic in hopes of getting that fluid out. Please let me know if you have any further nausea and vomiting. I'm hoping that your lab reports with that by Monday Linda back in touch with you then.

## 2014-04-12 ENCOUNTER — Other Ambulatory Visit: Payer: Self-pay | Admitting: Family Medicine

## 2014-04-13 ENCOUNTER — Telehealth: Payer: Self-pay | Admitting: *Deleted

## 2014-04-13 LAB — PTH, INTACT AND CALCIUM
Calcium: 8.4 mg/dL (ref 8.4–10.5)
PTH: 41 pg/mL (ref 14–64)

## 2014-04-13 NOTE — Telephone Encounter (Signed)
Pt.notified

## 2014-04-13 NOTE — Telephone Encounter (Signed)
Called and spoke to pt in regards to these results. He expressed understanding and wanted to know if you knew when you were wanting to do all this, also had concerns about the chest xray that was done . Reports he hasnt heard anything regards the results yet. Please advise. Thanks.

## 2014-04-13 NOTE — Telephone Encounter (Signed)
I'm not sure why my notes to lab are not being relayed to patient.  Xray was normal.  Labs were normal.  I am now awaiting referrals to get patient in to see an endocrinologist ASAP to better control sugar.  I also requested a rehab hospitalization to control the blood sugar.  What is going on?

## 2014-04-18 ENCOUNTER — Ambulatory Visit (INDEPENDENT_AMBULATORY_CARE_PROVIDER_SITE_OTHER): Payer: Managed Care, Other (non HMO) | Admitting: Family Medicine

## 2014-04-18 VITALS — BP 146/90 | HR 107 | Temp 97.9°F | Resp 18 | Ht 72.5 in | Wt 261.0 lb

## 2014-04-18 DIAGNOSIS — R0600 Dyspnea, unspecified: Secondary | ICD-10-CM | POA: Diagnosis not present

## 2014-04-18 DIAGNOSIS — R319 Hematuria, unspecified: Secondary | ICD-10-CM | POA: Diagnosis not present

## 2014-04-18 DIAGNOSIS — R600 Localized edema: Secondary | ICD-10-CM

## 2014-04-18 DIAGNOSIS — IMO0002 Reserved for concepts with insufficient information to code with codable children: Secondary | ICD-10-CM

## 2014-04-18 DIAGNOSIS — R5381 Other malaise: Secondary | ICD-10-CM

## 2014-04-18 DIAGNOSIS — E1165 Type 2 diabetes mellitus with hyperglycemia: Secondary | ICD-10-CM | POA: Diagnosis not present

## 2014-04-18 LAB — POCT URINALYSIS DIPSTICK
Bilirubin, UA: NEGATIVE
Glucose, UA: >=1000
Ketones, UA: NEGATIVE
Leukocytes, UA: NEGATIVE
Nitrite, UA: NEGATIVE
Protein, UA: >=300
Spec Grav, UA: 1.025
Urobilinogen, UA: 0.2
pH, UA: 5.5

## 2014-04-18 LAB — POCT UA - MICROSCOPIC ONLY
Bacteria, U Microscopic: NEGATIVE
Casts, Ur, LPF, POC: NEGATIVE
Crystals, Ur, HPF, POC: NEGATIVE
Epithelial cells, urine per micros: NEGATIVE
Mucus, UA: NEGATIVE
Renal tubular cells: POSITIVE
Yeast, UA: NEGATIVE

## 2014-04-18 NOTE — Progress Notes (Addendum)
Patient ID: Russell BirkenheadSteven B Barbour, male   DOB: 1970/12/28, 44 y.o.   MRN: [id number]   This chart was scribed for [name], MD by [facility] Abu Hashem, medical scribe at [facility].The patient was seen in exam room 10 and the patient's care was started at 10:36 AM.  Patient ID: Russell [name] MRN: [id number], DOB: 1970/12/28, 44 y.o. Date of Encounter: 04/18/2014  Primary Physician: Elvina SidleLAUENSTEIN,KURT, MD  Chief Complaint:  Chief Complaint  Patient presents with   Follow-up    hospital visit   HPI:  Russell BirkenheadSteven B Marotto is a 44 y.o. male who presents to [facility] here for a follow up. He was admitted to the hospital for intractable vomiting with nausea. Last seen here on 04/10/2014. He says blood pressure running 190's/110's. consistently elevated. Blood pressure recheck is 150/90. He states his legs are swollen, he has pain with walking  Blood sugar this morning was 200, but normally around 140. Yesterday in the afternoon his blood sugar spiked to 320. His blood sugar will occasionally bottom out to 60's-80's. He is unsure why. Pt is frustrated because he is not making any progress. The Demadex has not increased his urinary frequency.   Health coach calls him everyday because they are concerned with his blood pressure.  Pt also complains of wheezing, right sided chest pain and shortness of breath worsening at night when trying to sleep.  Past Medical History  Diagnosis Date   Thyroid disease    Diabetes mellitus without complication    Hypertension     Home Meds: Prior to Admission medications   Medication Sig Start Date End Date Taking? Authorizing Provider  ALPRAZolam Prudy Feeler(XANAX) 0.5 MG tablet TAKE 1 TABLET TWICE DAILY AS NEEDED FOR SLEEP 09/08/13  Yes [name], MD  amLODipine (NORVASC) 5 MG tablet Take 1 tablet (5 mg total) by mouth daily. 03/29/14  Yes Costin Otelia SergeantM Gherghe, MD  citalopram (CELEXA) 20 MG tablet Take 1 tablet (20 mg total) by mouth daily.  04/10/14  Yes [name], MD  cloNIDine (CATAPRES - DOSED IN MG/24 HR) 0.1 mg/24hr patch Place 1 patch (0.1 mg total) onto the skin once a week. Patient taking differently: Place 0.1 mg onto the skin every Saturday.  03/13/14  Yes [name], MD  gabapentin (NEURONTIN) 600 MG tablet Take 600 mg by mouth 4 (four) times daily.  12/19/11  Yes [name], MD  ibuprofen (ADVIL,MOTRIN) 800 MG tablet TAKE 1 TABLET (800 MG TOTAL) BY MOUTH EVERY 8 (EIGHT) HOURS AS NEEDED  "OV NEEDED FOR ADDITIONAL REFILLS" 04/13/14  Yes Chelle S Jeffery, PA-C  insulin glargine (LANTUS) 100 UNIT/ML injection Inject 0.15 mLs (15 Units total) into the skin 2 (two) times daily. 04/06/14  Yes Leana Roeawood S Elgergawy, MD  Insulin Lispro, Human, 200 UNIT/ML SOPN Inject 1-50 Units into the skin daily. 1 unit per 5 grams of carbohydrates with meals/snacks plus coverage scale. Total 50 units/day 03/29/14  Yes [name], MD  levothyroxine (SYNTHROID, LEVOTHROID) 50 MCG tablet Take 1 tablet (50 mcg total) by mouth daily. 05/02/13  Yes [name], MD  lisinopril (PRINIVIL,ZESTRIL) 20 MG tablet Take 1 tablet (20 mg total) by mouth daily. 03/13/14  Yes [name], MD  ondansetron (ZOFRAN ODT) 4 MG disintegrating tablet Take 1 tablet (4 mg total) by mouth every 8 (eight) hours as needed for nausea or vomiting. 03/29/14  Yes Costin Otelia SergeantM Gherghe, MD  pantoprazole (PROTONIX) 40 MG tablet Take 1 tablet (40 mg total)  by mouth 2 (two) times daily before a meal. 03/29/14  Yes Costin Otelia Sergeant, MD  promethazine (PHENERGAN) 12.5 MG tablet Take 1 tablet (12.5 mg total) by mouth every 6 (six) hours as needed for nausea or vomiting. 03/29/14  Yes Costin Otelia Sergeant, MD  rivaroxaban (XARELTO) 20 MG TABS tablet Take 1 tablet (20 mg total) by mouth daily with supper. 03/13/14  Yes [name], MD  sucralfate (CARAFATE) 1 GM/10ML suspension Take 10 mLs (1 g total) by mouth 4 (four) times daily -  with meals and at bedtime. 03/29/14  Yes Costin Otelia Sergeant, MD  torsemide (DEMADEX) 10 MG tablet Take 1 tablet (10 mg total) by mouth daily. 04/10/14  Yes [name], MD   Allergies: No Known Allergies  History   Social History   Marital Status: Single    Spouse Name: N/A   Number of Children: N/A   Years of Education: N/A   Occupational History   Not on file.   Social History Main Topics   Smoking status: Never Smoker    Smokeless tobacco: Not on file   Alcohol Use: No   Drug Use: No   Sexual Activity: Yes   Other Topics Concern   Not on file   Social History Narrative   Patient currently lives with his fiance   Works with facilities   Nonsmoker, nondrinker       Review of Systems: Constitutional: negative for chills, fever, night sweats, weight changes, or fatigue  HEENT: negative for vision changes, hearing loss, congestion, rhinorrhea, ST, epistaxis, or sinus pressure Cardiovascular: negative for palpitations. Positive for chest pain, leg swelling Respiratory: negative for hemoptysis,cough. Positive for wheezing, shortness of breath.  Abdominal: negative for abdominal pain, nausea, vomiting, diarrhea, or constipation Dermatological: negative for rash Neurologic: negative for headache, dizziness, or syncope All other systems reviewed and are otherwise negative with the exception to those above and in the HPI.  Physical Exam: Blood pressure 146/90, pulse 107, temperature 97.9 F (36.6 C), temperature source Oral, resp. rate 18, height 6' 0.5" (1.842 m), weight 261 lb (118.389 kg), SpO2 97 %., Body mass index is 34.89 kg/(m^2). General: Well developed, well nourished, in no acute distress. Head: Normocephalic, atraumatic, eyes without discharge, sclera non-icteric, nares are without discharge. Bilateral auditory canals clear, TM's are without perforation, pearly grey and translucent with reflective cone of light bilaterally. Oral cavity moist, posterior pharynx without exudate, erythema, peritonsillar  abscess, or post nasal drip.  Neck: Supple. No thyromegaly. Full ROM. No lymphadenopathy. Lungs: Breathing is unlabored. Expiratory wheezing bilaterally Heart: Regular rhythm, rapid heart beat,  No murmurs, rubs, or gallops appreciated. Abdomen: Soft, non-tender, non-distended with normoactive bowel sounds. No hepatomegaly. No rebound/guarding. No obvious abdominal masses. Msk:  Strength and tone normal for age. Extremities/Skin: Warm and dry. No clubbing or cyanosis. No rashes or suspicious lesions. 3+ edema bilaterally. Neuro: Alert and oriented X 3. Moves all extremities spontaneously. Gait is normal. CNII-XII grossly in tact. Psych:  Responds to questions appropriately with a normal affect.   Labs: Results for orders placed or performed in visit on 04/10/14  Comprehensive metabolic panel  Result Value Ref Range   Sodium 140 135 - 145 mEq/L   Potassium 4.2 3.5 - 5.3 mEq/L   Chloride 103 96 - 112 mEq/L   CO2 31 19 - 32 mEq/L   Glucose, Bld 210 (H) 70 - 99 mg/dL   BUN 15 6 - 23 mg/dL   Creat 1.61 0.96 - 0.45 mg/dL  Total Bilirubin 0.4 0.2 - 1.2 mg/dL   Alkaline Phosphatase 89 39 - 117 U/L   AST 33 0 - 37 U/L   ALT 31 0 - 53 U/L   Total Protein 5.3 (L) 6.0 - 8.3 g/dL   Albumin 2.8 (L) 3.5 - 5.2 g/dL   Calcium 8.2 (L) 8.4 - 10.5 mg/dL  Cortisol  Result Value Ref Range   Cortisol, Plasma 6.9 ug/dL  Prolactin  Result Value Ref Range   Prolactin 6.0 2.1 - 17.1 ng/mL  Follicle Stimulating Hormone  Result Value Ref Range   FSH 9.2 1.4 - 18.1 mIU/mL  PTH, Intact and Calcium  Result Value Ref Range   PTH 41 14 - 64 pg/mL   Calcium 8.4 8.4 - 10.5 mg/dL  TSH  Result Value Ref Range   TSH 3.492 0.350 - 4.500 uIU/mL  POCT CBC  Result Value Ref Range   WBC 5.3 4.6 - 10.2 K/uL   Lymph, poc 1.4 0.6 - 3.4   POC LYMPH PERCENT 25.6 10 - 50 %L   MID (cbc) 0.3 0 - 0.9   POC MID % 5.3 0 - 12 %M   POC Granulocyte 3.7 2 - 6.9   Granulocyte percent 69.1 37 - 80 %G   RBC 4.87 4.69 - 6.13  M/uL   Hemoglobin 13.7 (A) 14.1 - 18.1 g/dL   HCT, POC 16.1 (A) 09.6 - 53.7 %   MCV 85.7 80 - 97 fL   MCH, POC 28.1 27 - 31.2 pg   MCHC 32.8 31.8 - 35.4 g/dL   RDW, POC 04.5 %   Platelet Count, POC 287 142 - 424 K/uL   MPV 7.3 0 - 99.8 fL  POCT urinalysis dipstick  Result Value Ref Range   Color, UA yellow    Clarity, UA clear    Glucose, UA 250    Bilirubin, UA neg    Ketones, UA neg    Spec Grav, UA 1.025    Blood, UA moderate    pH, UA 6.0    Protein, UA >=300    Urobilinogen, UA 0.2    Nitrite, UA neg    Leukocytes, UA Negative     ASSESSMENT AND PLAN:  44 y.o. year old male with uncontrolled diabetes, orthopnea, hematuria, and hypertension which has been uncontrolled. He's had an hospitalization which resulted in no answers and no control of any of the above.  At this point I feel unable to manage these problems here in the urgent care setting. I've asked for help which has not been forthcoming for many of the consultants, so I am going to work on getting him admitted at either [facility] or [facility] on Monday or hopefully the consultants will take more of an interest and they have here.  This chart was scribed in my presence and reviewed by me personally.    ICD-9-CM ICD-10-CM   1. Type 2 diabetes mellitus, uncontrolled 250.02 E11.65 Ambulatory referral to Endocrinology  2. Hematuria 599.70 R31.9 POCT urinalysis dipstick     POCT UA - Microscopic Only  3. Dyspnea 786.09 R06.00   4. Pedal edema 782.3 R60.0   5. Malaise 780.79 R53.81    After discussing the situation with patient and his fiance, he agrees to go to [facility] emergency department for further evaluation and possible admission. I will call ahead and let them know is coming  Signed, [name], MD 04/18/2014 9:27 AM

## 2014-04-20 ENCOUNTER — Other Ambulatory Visit: Payer: Self-pay | Admitting: Family Medicine

## 2014-04-20 DIAGNOSIS — R600 Localized edema: Secondary | ICD-10-CM

## 2014-04-20 DIAGNOSIS — R0601 Orthopnea: Secondary | ICD-10-CM

## 2014-04-20 DIAGNOSIS — R319 Hematuria, unspecified: Secondary | ICD-10-CM

## 2014-04-20 DIAGNOSIS — IMO0002 Reserved for concepts with insufficient information to code with codable children: Secondary | ICD-10-CM

## 2014-04-20 DIAGNOSIS — E1165 Type 2 diabetes mellitus with hyperglycemia: Secondary | ICD-10-CM

## 2014-04-21 ENCOUNTER — Telehealth: Payer: Self-pay

## 2014-04-21 NOTE — Telephone Encounter (Signed)
We need to better understand the edema in his feet and his feelings of fatigue in the context of his diabetes.  It is important to rule out cardiac disease as a cause

## 2014-04-21 NOTE — Telephone Encounter (Signed)
Pt of Dr. Elbert EwingsL requesting to speak with Dr. Elbert EwingsL , " at his earliest convince." Would not give me more information than that.

## 2014-04-21 NOTE — Telephone Encounter (Signed)
Spoke with pt. [facility]. They didn't do anything for him. He has an appt tomorrow with Cardiology. Is there anything you would like for him to do?

## 2014-04-22 ENCOUNTER — Other Ambulatory Visit: Payer: Self-pay | Admitting: *Deleted

## 2014-04-22 ENCOUNTER — Ambulatory Visit (INDEPENDENT_AMBULATORY_CARE_PROVIDER_SITE_OTHER): Payer: Managed Care, Other (non HMO) | Admitting: Cardiology

## 2014-04-22 ENCOUNTER — Encounter: Payer: Self-pay | Admitting: *Deleted

## 2014-04-22 ENCOUNTER — Encounter: Payer: Self-pay | Admitting: Cardiology

## 2014-04-22 VITALS — BP 144/88 | HR 107 | Ht 72.5 in | Wt 260.4 lb

## 2014-04-22 DIAGNOSIS — I10 Essential (primary) hypertension: Secondary | ICD-10-CM

## 2014-04-22 DIAGNOSIS — R072 Precordial pain: Secondary | ICD-10-CM

## 2014-04-22 DIAGNOSIS — I82409 Acute embolism and thrombosis of unspecified deep veins of unspecified lower extremity: Secondary | ICD-10-CM | POA: Diagnosis not present

## 2014-04-22 DIAGNOSIS — R079 Chest pain, unspecified: Secondary | ICD-10-CM | POA: Diagnosis not present

## 2014-04-22 DIAGNOSIS — R609 Edema, unspecified: Secondary | ICD-10-CM

## 2014-04-22 DIAGNOSIS — R600 Localized edema: Secondary | ICD-10-CM | POA: Diagnosis not present

## 2014-04-22 DIAGNOSIS — R0600 Dyspnea, unspecified: Secondary | ICD-10-CM

## 2014-04-22 MED ORDER — TORSEMIDE 20 MG PO TABS: 20.0000 mg | ORAL_TABLET | Freq: Every day | ORAL | Status: DC

## 2014-04-22 MED ORDER — LISINOPRIL 40 MG PO TABS: 40.0000 mg | ORAL_TABLET | Freq: Every day | ORAL | Status: DC

## 2014-04-22 MED ORDER — METOPROLOL SUCCINATE ER 25 MG PO TB24
25.0000 mg | ORAL_TABLET | Freq: Every day | ORAL | Status: DC
Start: 2014-04-22 — End: 2014-05-20

## 2014-04-22 NOTE — Patient Instructions (Addendum)
Your physician recommends that you schedule a follow-up appointment in: 4 WEEKS WITH DR Jens SomRENSHAW  Your physician has requested that you have an echocardiogram. Echocardiography is a painless test that uses sound waves to create images of your heart. It provides your doctor with information about the size and shape of your heart and how well your heart's chambers and valves are working. This procedure takes approximately one hour. There are no restrictions for this procedure.   Your physician has requested that you have a renal artery duplex. During this test, an ultrasound is used to evaluate blood flow to the kidneys. Allow one hour for this exam. Do not eat after midnight the day before and avoid carbonated beverages. Take your medications as you usually do.   STOP CLONIDINE  INCREASE LISINOPRIL TO 20 MG ONCE DAILY= 2 OF THE 10 MG TABLETS ONCE DAILY  INCREASE TORSEMIDE TO 20 MG ONCE DAILY= 2 OF THE 10 MG TABLETS ONCE DAILY  START METOPROLOL SUCC ER 25 MG ONCE DAILY AT BEDTIME  VQ SCAN TO [facility]ALUATE LUNGS [facility] [place]  Your physician recommends that you return for lab work in: ONE WEEK

## 2014-04-22 NOTE — Assessment & Plan Note (Signed)
Etiology of edema unclear. Echocardiogram to assess LV function. The edema predates the initiation of amlodipine. I therefore do not think this is the cause. In reviewing his records his urinalysis shows greater than 300 mg/dL of protein. Albumin is mildly decreased. Question if he has developed nephrotic syndrome related to diabetes mellitus. Schedule 24 hour urine for protein. Increase Demadex to 20 mg daily. Check potassium, renal function and BNP in 1 week.

## 2014-04-22 NOTE — Progress Notes (Signed)
HPI: 44 year old male for evaluation of pedal edema, chest pain and dyspnea. Patient treated for bilateral lower extremity cellulitis in January 2016; venous ultrasound of lower extremities showed no DVT. 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 Possible superficial thrombophlebitis. Admitted in March 2016 with nausea and vomiting. Felt to be in DKA which improved with insulin and IV fluids. Urinalysis March 2016 showed greater than 300 mg/dL of protein. Normal gastric emptying study. Right upper quadrant ultrasound unremarkable. EGD showed severe erosive esophagitis. Chest x-ray April 2016 showed no congestive heart failure. BUN and creatinine 15 and 0.98. Albumin 2.8. Normal TSH. Cortisol level VI.9. Patient has had minimal pedal edema but in the past 3 weeks this has worsened. He also notes dyspnea on exertion but no orthopnea or PND. He also has occasional chest pain that lasts 30 seconds and resolves. Not pleuritic, positional, exertional, related to food. No associated symptoms. Resolves spontaneously. Patient seen at [facility] this past weekend for dyspnea. There is a note that said quick look bedside echocardiogram showed normal LV function. Pro BNP 196.   Current Outpatient Prescriptions  Medication Sig Dispense Refill  . ALPRAZolam (XANAX) 0.5 MG tablet TAKE 1 TABLET TWICE DAILY AS NEEDED FOR SLEEP 60 tablet 4  . amLODipine (NORVASC) 5 MG tablet Take 1 tablet (5 mg total) by mouth daily. 30 tablet 1  . citalopram (CELEXA) 20 MG tablet Take 1 tablet (20 mg total) by mouth daily. 30 tablet 3  . gabapentin (NEURONTIN) 600 MG tablet Take 600 mg by mouth 4 (four) times daily.     Marland Kitchen HYDROcodone-acetaminophen (NORCO/VICODIN) 5-325 MG per tablet Take 1 tablet by mouth every 6 (six) hours as needed for moderate pain.    Marland Kitchen insulin glargine (LANTUS) 100 UNIT/ML injection Inject 0.15 mLs (15 Units total) into the skin 2 (two) times daily. 10 mL 5  . Insulin  Lispro, Human, 200 UNIT/ML SOPN Inject 1-50 Units into the skin daily. 1 unit per 5 grams of carbohydrates with meals/snacks plus coverage scale. Total 50 units/day 3 pen 5  . levothyroxine (SYNTHROID, LEVOTHROID) 50 MCG tablet Take 1 tablet (50 mcg total) by mouth daily. 90 tablet 3  . lisinopril (PRINIVIL,ZESTRIL) 40 MG tablet Take 1 tablet (40 mg total) by mouth daily. 90 tablet 3  . metoCLOPramide (REGLAN) 10 MG tablet Take 5 mg by mouth 4 (four) times daily.    . ondansetron (ZOFRAN ODT) 4 MG disintegrating tablet Take 1 tablet (4 mg total) by mouth every 8 (eight) hours as needed for nausea or vomiting. 20 tablet 0  . promethazine (PHENERGAN) 12.5 MG tablet Take 1 tablet (12.5 mg total) by mouth every 6 (six) hours as needed for nausea or vomiting. 30 tablet 0  . rivaroxaban (XARELTO) 20 MG TABS tablet Take 1 tablet (20 mg total) by mouth daily with supper. 30 tablet 1  . sucralfate (CARAFATE) 1 GM/10ML suspension Take 10 mLs (1 g total) by mouth 4 (four) times daily -  with meals and at bedtime. 420 mL 1  . torsemide (DEMADEX) 20 MG tablet Take 1 tablet (20 mg total) by mouth daily. 90 tablet 3  . metoprolol succinate (TOPROL XL) 25 MG 24 hr tablet Take 1 tablet (25 mg total) by mouth daily. 90 tablet 3   No current facility-administered medications for this visit.    No Known Allergies   Past Medical History  Diagnosis Date  . Thyroid disease   . Diabetes mellitus  without complication   . Hypertension     Past Surgical History  Procedure Laterality Date  . Right ankle fx repair    . Eye surgery    . Esophagogastroduodenoscopy N/A 03/29/2014    Procedure: ESOPHAGOGASTRODUODENOSCOPY (EGD);  Surgeon: Dorena CookeyJohn Hayes, MD;  [facility];  Service: Endoscopy;  Laterality: N/A;  . Tonsillectomy and adenoidectomy      History   Social History  . Marital Status: Single    Spouse Name: N/A  . Number of Children: N/A  . Years of Education: N/A   Occupational History  .       Maintenance [employer]   Social History Main Topics  . Smoking status: Never Smoker   . Smokeless tobacco: Not on file  . Alcohol Use: No  . Drug Use: No  . Sexual Activity: Yes   Other Topics Concern  . Not on file   Social History Narrative   Patient currently lives with his fiance   Works with facilities   Nonsmoker, nondrinker       Family History  Problem Relation Age of Onset  . Obesity Mother     Patient states that family members have no other medical illnesses other than what I have described  . Heart disease      No family history    ROS: no fevers or chills, productive cough, hemoptysis, dysphasia, odynophagia, melena, hematochezia, dysuria, hematuria, rash, seizure activity, orthopnea, PND, claudication. Remaining systems are negative.  Physical Exam:   Blood pressure 144/88, pulse 107, height 6' 0.5" (1.842 m), weight 260 lb 6.4 oz (118.117 kg), SpO2 99 %.  General:  Well developed/well nourished in NAD Skin warm/dry Patient not depressed No peripheral clubbing Back-normal HEENT-normal/normal eyelids Neck supple/normal carotid upstroke bilaterally; no bruits; no JVD; no thyromegaly chest - CTA/ normal expansion CV - RRR/normal S1 and S2; no murmurs, rubs or gallops;  PMI nondisplaced Abdomen -NT/ND, no HSM, no mass, + bowel sounds, no bruit 2+ femoral pulses, no bruits Ext-1-2+ edema, no chords, 2+ DP, excoriations noted Neuro-grossly nonfocal  ECG 03/24/2014-sinus tachycardia

## 2014-04-22 NOTE — Assessment & Plan Note (Signed)
Etiology unclear. He does appear to be volume overloaded on examination but recent proBNP minimally elevated. Schedule echocardiogram to assess LV function. Await V/Q results.

## 2014-04-22 NOTE — Assessment & Plan Note (Addendum)
Blood pressure elevated. Discontinue clonidine. Increase lisinopril to 40 mg daily. Add toprol 25 mg daily. Check potassium, renal function and BNP in 1 week. Patient's hypertension recent in onset. Schedule renal Dopplers to exclude renal artery stenosis.

## 2014-04-22 NOTE — Assessment & Plan Note (Signed)
Previous ultrasound showed no DVT but thrombosed superficial varicosities. Patient on xarelto; ? If this needs to be continued; being managed by primary care.

## 2014-04-22 NOTE — Assessment & Plan Note (Signed)
Patient symptoms are atypical. I will arrange a VQ scan to screen for pulmonary embolus. We will consider an exercise treadmill in the future after he improves.

## 2014-04-23 ENCOUNTER — Ambulatory Visit (HOSPITAL_COMMUNITY)
Admission: RE | Admit: 2014-04-23 | Discharge: 2014-04-23 | Disposition: A | Discharge status: Home / Self Care | Payer: Managed Care, Other (non HMO) | Source: Ambulatory Visit | Attending: Cardiology | Admitting: Cardiology

## 2014-04-23 ENCOUNTER — Ambulatory Visit (INDEPENDENT_AMBULATORY_CARE_PROVIDER_SITE_OTHER): Payer: Managed Care, Other (non HMO) | Admitting: Internal Medicine

## 2014-04-23 ENCOUNTER — Other Ambulatory Visit (INDEPENDENT_AMBULATORY_CARE_PROVIDER_SITE_OTHER): Payer: Managed Care, Other (non HMO)

## 2014-04-23 ENCOUNTER — Encounter: Payer: Self-pay | Admitting: Internal Medicine

## 2014-04-23 VITALS — BP 142/88 | HR 108 | Temp 98.1°F | Resp 12 | Ht 74.0 in | Wt 263.4 lb

## 2014-04-23 DIAGNOSIS — R079 Chest pain, unspecified: Secondary | ICD-10-CM

## 2014-04-23 DIAGNOSIS — M7989 Other specified soft tissue disorders: Secondary | ICD-10-CM | POA: Insufficient documentation

## 2014-04-23 DIAGNOSIS — R918 Other nonspecific abnormal finding of lung field: Secondary | ICD-10-CM | POA: Diagnosis not present

## 2014-04-23 DIAGNOSIS — IMO0002 Reserved for concepts with insufficient information to code with codable children: Secondary | ICD-10-CM

## 2014-04-23 DIAGNOSIS — I517 Cardiomegaly: Secondary | ICD-10-CM | POA: Diagnosis not present

## 2014-04-23 DIAGNOSIS — E11319 Type 2 diabetes mellitus with unspecified diabetic retinopathy without macular edema: Secondary | ICD-10-CM | POA: Diagnosis not present

## 2014-04-23 DIAGNOSIS — R0602 Shortness of breath: Secondary | ICD-10-CM | POA: Diagnosis not present

## 2014-04-23 DIAGNOSIS — E039 Hypothyroidism, unspecified: Secondary | ICD-10-CM | POA: Diagnosis not present

## 2014-04-23 DIAGNOSIS — E1165 Type 2 diabetes mellitus with hyperglycemia: Secondary | ICD-10-CM

## 2014-04-23 LAB — GLUCOSE, RANDOM: Glucose, Bld: 145 mg/dL — ABNORMAL HIGH (ref 70–99)

## 2014-04-23 MED ORDER — TECHNETIUM TO 99M ALBUMIN AGGREGATED
6.0000 | Freq: Once | INTRAVENOUS | Status: AC | PRN
  Administered 2014-04-23: 6 via INTRAVENOUS

## 2014-04-23 MED ORDER — TECHNETIUM TC 99M DIETHYLENETRIAME-PENTAACETIC ACID: 40.0000 | Freq: Once | INTRAVENOUS | Status: AC | PRN

## 2014-04-23 NOTE — Patient Instructions (Addendum)
Please continue:  - Lantus 20 units in am  - Apidra - ICR 1:5, target 150, ISF 10 >> please start Humalog U200 after running out of Apidra. Please inject the rapid acting insulin 15 min before a meal!  - Please start Metformin 500 mg with dinner x 4 days. If you tolerate this well, add another Metformin tablet (500 mg) with breakfast x 4 days. If you tolerate this well, add another metformin tablet with dinner (total 1000 mg) x 4 days. If you tolerate this well, add another metformin tablet with breakfast (total 1000 mg). Continue with 1000 mg of metformin 2x a day with breakfast and dinner.  Please stop at the lab.  Please return in 1 month with your sugar log.   PATIENT INSTRUCTIONS FOR TYPE 2 DIABETES:  DIET AND EXERCISE Diet and exercise is an important part of diabetic treatment.  We recommended aerobic exercise in the form of brisk walking (working between 40-60% of maximal aerobic capacity, similar to brisk walking) for 150 minutes per week (such as 30 minutes five days per week) along with 3 times per week performing 'resistance' training (using various gauge rubber tubes with handles) 5-10 exercises involving the major muscle groups (upper body, lower body and core) performing 10-15 repetitions (or near fatigue) each exercise. Start at half the above goal but build slowly to reach the above goals. If limited by weight, joint pain, or disability, we recommend daily walking in a swimming pool with water up to waist to reduce pressure from joints while allow for adequate exercise.    BLOOD GLUCOSES Monitoring your blood glucoses is important for continued management of your diabetes. Please check your blood glucoses 2-4 times a day: fasting, before meals and at bedtime (you can rotate these measurements - e.g. one day check before the 3 meals, the next day check before 2 of the meals and before bedtime, etc.).   HYPOGLYCEMIA (low blood sugar) Hypoglycemia is usually a reaction to not  eating, exercising, or taking too much insulin/ other diabetes drugs.  Symptoms include tremors, sweating, hunger, confusion, headache, etc. Treat IMMEDIATELY with 15 grams of Carbs: . 4 glucose tablets .  cup regular juice/soda . 2 tablespoons raisins . 4 teaspoons sugar . 1 tablespoon honey Recheck blood glucose in 15 mins and repeat above if still symptomatic/blood glucose <100.  RECOMMENDATIONS TO REDUCE YOUR RISK OF DIABETIC COMPLICATIONS: * Take your prescribed MEDICATION(S) * Follow a DIABETIC diet: Complex carbs, fiber rich foods, (monounsaturated and polyunsaturated) fats * AVOID saturated/trans fats, high fat foods, >2,300 mg salt per day. * EXERCISE at least 5 times a week for 30 minutes or preferably daily.  * DO NOT SMOKE OR DRINK more than 1 drink a day. * Check your FEET every day. Do not wear tightfitting shoes. Contact us if you develop an ulcer * See your EYE doctor once a year or more if needed * Get a FLU shot once a year * Get a PNEUMONIA vaccine once before and once after age 43 years  GOALS:  * Your Hemoglobin A1c of <7%  * fasting sugars need to be <130 * after meals sugars need to be <180 (2h after you start eating) * Your Systolic BP should be 140 or lower  * Your Diastolic BP should be 80 or lower  * Your HDL (Good Cholesterol) should be 40 or higher  * Your LDL (Bad Cholesterol) should be 100 or lower. * Your Triglycerides should be 150 or lower  *  Your Urine microalbumin (kidney function) should be <30 * Your Body Mass Index should be 25 or lower    Please consider the following ways to cut down carbs and fat and increase fiber and micronutrients in your diet: - substitute whole grain for white bread or pasta - substitute brown rice for white rice - substitute 90-calorie flat bread pieces for slices of bread when possible - substitute sweet potatoes or yams for white potatoes - substitute humus for margarine - substitute tofu for cheese when  possible - substitute almond or rice milk for regular milk (would not drink soy milk daily due to concern for soy estrogen influence on breast cancer risk) - substitute dark chocolate for other sweets when possible - substitute water - can add lemon or orange slices for taste - for diet sodas (artificial sweeteners will trick your body that you can eat sweets without getting calories and will lead you to overeating and weight gain in the long run) - do not skip breakfast or other meals (this will slow down the metabolism and will result in more weight gain over time)  - can try smoothies made from fruit and almond/rice milk in am instead of regular breakfast - can also try old-fashioned (not instant) oatmeal made with almond/rice milk in am - order the dressing on the side when eating salad at a restaurant (pour less than half of the dressing on the salad) - eat as little meat as possible - can try juicing, but should not forget that juicing will get rid of the fiber, so would alternate with eating raw veg./fruits or drinking smoothies - use as little oil as possible, even when using olive oil - can dress a salad with a mix of balsamic vinegar and lemon juice, for e.g. - use agave nectar, stevia sugar, or regular sugar rather than artificial sweateners - steam or broil/roast veggies  - snack on veggies/fruit/nuts (unsalted, preferably) when possible, rather than processed foods - reduce or eliminate aspartame in diet (it is in diet sodas, chewing gum, etc) Read the labels!  Try to read Dr. Katherina Right book: "Program for Reversing Diabetes" for other ideas for healthy eating.

## 2014-04-23 NOTE — Progress Notes (Signed)
Patient ID: Russell [name], male   DOB: [birth year], 44 y.o.   MRN: [id number]  HPI: Russell [name] is a 44 y.o.-year-old male, referred by his PCP, Dr. Comer LocketLauestein, for management of DM2, dx in 2011, insulin-dependent 2014, uncontrolled, with complications (DR, PN). He was previously seen by Dr. Everardo AllEllison.  He was hospitalized for DKA in 03/2014.   He also has B leg swelling and leg rash - worse in last month, but the swelling is chronic.  He is on short term disability - "I have blood clots in legs".  Last hemoglobin A1c was: Lab Results  Component Value Date   HGBA1C 10.6* 03/25/2014   HGBA1C 7.1 10/22/2013   HGBA1C 11.3 03/06/2013   Pt is on a regimen of: - Lantus 20 units a day (previously 60 units) in am - will start working 3rd shift - Apidra with meals - ICR 1:5, target 150, ISF 10 - end up with 15-20 units per meal - after a meal He was on Metformin 1000 mg 2x a day >> tolerated it well.  Pt checks his sugars 2-6x a day and they are: - am: 147-210 - 2h after b'fast: 250, 266 - before lunch: 138, 176-292 - 2h after lunch: 83-330 - before dinner: 133-234 - 2h after dinner: 84, 113-288 - bedtime: 88-199 - nighttime: n/c No lows. Lowest sugar was 80s; he has hypoglycemia awareness at 70.  Highest sugar was 300s.  Glucometer: [employer], physiologicalTouch Expert.  Pt's meals are: - Breakfast: eggs, sausage, bacon, toast - Lunch: salad - Dinner: meat + veggies - Snacks: meat, cheese rolls  - no CKD, last BUN/creatinine:  Lab Results  Component Value Date   BUN 15 04/10/2014   CREATININE 0.98 04/10/2014  On Lisinopril. - last set of lipids: Lab Results  Component Value Date   CHOL 178 09/12/2013   HDL 42 09/12/2013   LDLCALC 98 09/12/2013   TRIG 189* 09/12/2013   CHOLHDL 4.2 09/12/2013  Not on a statin. Simvastatin >> irritability.  - last eye exam was in 04/2014.  Dr Allyne GeeSanders. + DR. He had eye surgery 04/2014. - + numbness and tingling in his feet. On Neurontin.  Pt has no FH of  DM.  He has hypothyroidism: - dx in 2015 - on LT4 50 mcg daily: - daily - in am - 30 min before a meal - no PPI, no MVI, no Ca or Fe - on Carafate - at nighttime  Lab Results  Component Value Date   TSH 3.492 04/10/2014   Also has HTN, HL.  ROS: Constitutional: + weight gain, + fatigue, + hot flushes, + poor sleep, + nocturia Eyes: + blurry vision, no xerophthalmia ENT: no sore throat, no nodules palpated in throat, no dysphagia/odynophagia, no hoarseness Cardiovascular: + CP/+ SOB/no palpitations/+ leg swelling Respiratory: no cough/+ SOB Gastrointestinal: + N/no V/D/C, + heartburn Musculoskeletal: + muscle aches/+ joint aches Skin: + rash on legs, + itching, + easy bruising, + hair loss Neurological: no tremors/numbness/tingling/dizziness, + HA Psychiatric: no depression/+ anxiety  Past Medical History  Diagnosis Date  . Thyroid disease   . Diabetes mellitus without complication   . Hypertension    Past Surgical History  Procedure Laterality Date  . Right ankle fx repair    . Eye surgery    . Esophagogastroduodenoscopy N/A 03/29/2014    Procedure: ESOPHAGOGASTRODUODENOSCOPY (EGD);  Surgeon: Dorena CookeyJohn Hayes, MD;  Location: [facility];  Service: Endoscopy;  Laterality: N/A;  . Tonsillectomy and adenoidectomy     History  Social History  . Marital Status: Single    Spouse Name: N/A  . Number of Children: no   Occupational History  .      Maintenance Curator   Social History Main Topics  . Smoking status: Never Smoker   . Smokeless tobacco: Not on file  . Alcohol Use: No  . Drug Use: No  . Sexual Activity: Yes   Social History Narrative   Patient currently lives with his fiance   Works with facilities   Nonsmoker, nondrinker   Current Outpatient Prescriptions on File Prior to Visit  Medication Sig Dispense Refill  . ALPRAZolam (XANAX) 0.5 MG tablet TAKE 1 TABLET TWICE DAILY AS NEEDED FOR SLEEP 60 tablet 4  . amLODipine (NORVASC) 5 MG tablet Take 1  tablet (5 mg total) by mouth daily. 30 tablet 1  . citalopram (CELEXA) 20 MG tablet Take 1 tablet (20 mg total) by mouth daily. 30 tablet 3  . gabapentin (NEURONTIN) 600 MG tablet Take 600 mg by mouth 4 (four) times daily.     Marland Kitchen HYDROcodone-acetaminophen (NORCO/VICODIN) 5-325 MG per tablet Take 1 tablet by mouth every 6 (six) hours as needed for moderate pain.    Marland Kitchen insulin glargine (LANTUS) 100 UNIT/ML injection Inject 0.15 mLs (15 Units total) into the skin 2 (two) times daily. 10 mL 5  . Insulin Lispro, Human, 200 UNIT/ML SOPN Inject 1-50 Units into the skin daily. 1 unit per 5 grams of carbohydrates with meals/snacks plus coverage scale. Total 50 units/day 3 pen 5  . levothyroxine (SYNTHROID, LEVOTHROID) 50 MCG tablet Take 1 tablet (50 mcg total) by mouth daily. 90 tablet 3  . lisinopril (PRINIVIL,ZESTRIL) 40 MG tablet Take 1 tablet (40 mg total) by mouth daily. 90 tablet 3  . metoCLOPramide (REGLAN) 10 MG tablet Take 5 mg by mouth 4 (four) times daily.    . metoprolol succinate (TOPROL XL) 25 MG 24 hr tablet Take 1 tablet (25 mg total) by mouth daily. 90 tablet 3  . ondansetron (ZOFRAN ODT) 4 MG disintegrating tablet Take 1 tablet (4 mg total) by mouth every 8 (eight) hours as needed for nausea or vomiting. 20 tablet 0  . promethazine (PHENERGAN) 12.5 MG tablet Take 1 tablet (12.5 mg total) by mouth every 6 (six) hours as needed for nausea or vomiting. 30 tablet 0  . rivaroxaban (XARELTO) 20 MG TABS tablet Take 1 tablet (20 mg total) by mouth daily with supper. 30 tablet 1  . sucralfate (CARAFATE) 1 GM/10ML suspension Take 10 mLs (1 g total) by mouth 4 (four) times daily -  with meals and at bedtime. 420 mL 1  . torsemide (DEMADEX) 20 MG tablet Take 1 tablet (20 mg total) by mouth daily. 90 tablet 3   No current facility-administered medications on file prior to visit.   No Known Allergies Family History  Problem Relation Age of Onset  . Obesity Mother     Patient states that family members  have no other medical illnesses other than what I have described  . Heart disease      No family history   PE: BP 142/88 mmHg  Pulse 108  Temp(Src) 98.1 F (36.7 C) (Oral)  Resp 12  Ht  (1.88 m)  Wt 263 lb 6.4 oz (119.477 kg)  BMI 33.80 kg/m2  SpO2 97% Wt Readings from Last 3 Encounters:  04/23/14 263 lb 6.4 oz (119.477 kg)  04/22/14 260 lb 6.4 oz (118.117 kg)  04/18/14 261 lb (118.389 kg)  Constitutional: overweight, in NAD Eyes: PERRLA, EOMI, no exophthalmos ENT: moist mucous membranes, no thyromegaly, no cervical lymphadenopathy Cardiovascular: RRR, No MRG, + B pitting edema up to knees Respiratory: CTA B Gastrointestinal: abdomen soft, NT, ND, BS+ Musculoskeletal: no deformities, strength intact in all 4 Skin: moist, warm, no rashes Neurological: no tremor with outstretched hands, DTR normal in all 4  ASSESSMENT: 1. DM2, insulin-dependent, uncontrolled, with complications - DR - PN  2. Hypothyroidism  PLAN:  1. Patient with uncontrolled diabetes, on basal-bolus antidiabetic regimen, which became insufficient. He takes the mealtime insulin after a meal >> advised him to move this before a meal. Also, will start back Metformin. Discussed also about diet and how to improve this. Will also check him for type 1 DM. - We discussed about options for treatment, and I suggested to:  Patient Instructions  Please continue: - Lantus 20 units in am - Apidra - ICR 1:5, target 150, ISF 10 >> please start Humalog U200 after running out of Apidra. Please inject the rapid acting insulin 15 min before a meal. - Please start Metformin 500 mg with dinner x 4 days. If you tolerate this well, add another Metformin tablet (500 mg) with breakfast x 4 days. If you tolerate this well, add another metformin tablet with dinner (total 1000 mg) x 4 days. If you tolerate this well, add another metformin tablet with breakfast (total 1000 mg). Continue with 1000 mg of metformin 2x a day with  breakfast and dinner.  Please stop at the lab.  Please return in 1 month with your sugar log.   - Strongly advised him to start checking sugars at different times of the day - check 2 times a day, rotating checks - given sugar log and advised how to fill it and to bring it at next appt  - given foot care handout and explained the principles  - given instructions for hypoglycemia management "15-15 rule"  - advised for yearly eye exams >> he is UTD - Return to clinic in 1 mo with sugar log   Orders Placed This Encounter  Procedures  . Glucose, Random  . C-peptide  . Glutamic acid decarboxylase auto abs  . Anti-islet cell antibody   2. Hypothyroidism - well controlled - advised to Take the thyroid hormone every day, with water, >30 minutes before breakfast, separated by >4 hours from acid reflux medications, calcium, iron, multivitamins. - continue LT4 50 mcg daily - I will continue to follow this  - time spent with the patient: 1 hour, of which >50% was spent in obtaining information about his diabetes, reviewing his previous labs, evaluations, hospitalizations, and treatments, counseling him about his condition (please see the discussed topics above), and developing a plan to further investigate and treat it; he had a number of questions which I addressed.

## 2014-04-24 LAB — C-PEPTIDE: C-Peptide: 1.31 ng/mL (ref 0.80–3.90)

## 2014-04-29 LAB — ANTI-ISLET CELL ANTIBODY

## 2014-04-30 ENCOUNTER — Telehealth: Payer: Self-pay | Admitting: Internal Medicine

## 2014-04-30 LAB — PROTEIN, URINE, 24 HOUR
Protein, 24H Urine: 7500 mg/d — ABNORMAL HIGH (ref <150)
Protein, Urine: 375 mg/dL — ABNORMAL HIGH (ref 5–25)

## 2014-04-30 NOTE — Telephone Encounter (Signed)
Returned pt's call. Pt stated that his blood sugars have been consistently from 200-350's since switching to the 200 insulin. Pt stated his blood sugar yesterday, 2 hrs after lunch (stir fry chicken and beef with veggies) was 350. Pt stated that this AM his fasting was 200, he ate breakfast and injected 15 units of Apidra (his ins denied this in the past), his blood sugar came down to 130. Please advise.

## 2014-04-30 NOTE — Telephone Encounter (Signed)
Called pt and advised him per Dr Charlean SanfilippoGherghe's message below. Pt voiced understanding and will call back tomorrow afternoon. Be advised.

## 2014-04-30 NOTE — Telephone Encounter (Signed)
Pt left message on office voicemail, 04/30/14 at 1150  Pt would like to speak with CMA regarding blood sugar issues please

## 2014-04-30 NOTE — Telephone Encounter (Signed)
Please: - Continue Lantus 20 units in am - Stay on the U200 Humalog for now  ICR 1:5  target 150 >> 120  ISF 10  Please inject the rapid acting insulin 15 min before a meal. - Please continue Metformin   Call back tomorrow pm.

## 2014-05-01 ENCOUNTER — Telehealth: Payer: Self-pay | Admitting: *Deleted

## 2014-05-01 DIAGNOSIS — E1110 Type 2 diabetes mellitus with ketoacidosis without coma: Secondary | ICD-10-CM

## 2014-05-01 MED ORDER — INSULIN LISPRO 200 UNIT/ML ~~LOC~~ SOPN: 1.0000 [IU] | PEN_INJECTOR | Freq: Every day | SUBCUTANEOUS | Status: DC

## 2014-05-01 NOTE — Telephone Encounter (Signed)
Pt called back to let us know that his blood sugars are better. He said that his blood sugar was 170. This morning fasting was 130. He said he called his health coach today and she is going to help him with carb counting. He said that he is really trying to use his sliding scale more rigidly and consistently. He took additional units to counteract the amount of carbs he ate. He feels that he has a better understanding about carb counting and the sliding scale. He will call back if he has any drops or fluctuations in his blood sugar. Pt requested a refill of his 200 insulin. Refill sent to pt's pharmacy.

## 2014-05-01 NOTE — Telephone Encounter (Signed)
Excellent

## 2014-05-04 LAB — GLUTAMIC ACID DECARBOXYLASE AUTO ABS: Glutamic Acid Decarb Ab: <5 IU/mL (ref <5)

## 2014-05-05 ENCOUNTER — Ambulatory Visit (HOSPITAL_COMMUNITY)
Admission: RE | Admit: 2014-05-05 | Discharge: 2014-05-05 | Disposition: A | Discharge status: Home / Self Care | Payer: Managed Care, Other (non HMO) | Source: Ambulatory Visit | Attending: Cardiology | Admitting: Cardiology

## 2014-05-05 DIAGNOSIS — E785 Hyperlipidemia, unspecified: Secondary | ICD-10-CM | POA: Insufficient documentation

## 2014-05-05 DIAGNOSIS — R0600 Dyspnea, unspecified: Secondary | ICD-10-CM | POA: Insufficient documentation

## 2014-05-05 DIAGNOSIS — I10 Essential (primary) hypertension: Secondary | ICD-10-CM

## 2014-05-05 DIAGNOSIS — E119 Type 2 diabetes mellitus without complications: Secondary | ICD-10-CM | POA: Diagnosis not present

## 2014-05-05 DIAGNOSIS — Z794 Long term (current) use of insulin: Secondary | ICD-10-CM | POA: Diagnosis not present

## 2014-05-05 DIAGNOSIS — R079 Chest pain, unspecified: Secondary | ICD-10-CM

## 2014-05-05 DIAGNOSIS — R600 Localized edema: Secondary | ICD-10-CM

## 2014-05-06 LAB — BASIC METABOLIC PANEL WITH GFR
BUN: 26 mg/dL — ABNORMAL HIGH (ref 6–23)
CHLORIDE: 105 meq/L (ref 96–112)
CO2: 24 mEq/L (ref 19–32)
Calcium: 9.1 mg/dL (ref 8.4–10.5)
Creat: 1.1 mg/dL (ref 0.50–1.35)
GFR, EST NON AFRICAN AMERICAN: 82 mL/min
GFR, Est African American: >89 mL/min
Glucose, Bld: 295 mg/dL — ABNORMAL HIGH (ref 70–99)
Potassium: 4.8 mEq/L (ref 3.5–5.3)
Sodium: 139 mEq/L (ref 135–145)

## 2014-05-06 LAB — BRAIN NATRIURETIC PEPTIDE: Brain Natriuretic Peptide: 41.7 pg/mL (ref 0.0–100.0)

## 2014-05-13 ENCOUNTER — Encounter: Payer: Self-pay | Admitting: Cardiology

## 2014-05-18 ENCOUNTER — Ambulatory Visit (INDEPENDENT_AMBULATORY_CARE_PROVIDER_SITE_OTHER): Payer: Managed Care, Other (non HMO) | Admitting: Family Medicine

## 2014-05-18 VITALS — BP 170/116 | HR 97 | Temp 98.2°F | Resp 16 | Ht 72.0 in | Wt 266.0 lb

## 2014-05-18 DIAGNOSIS — E1129 Type 2 diabetes mellitus with other diabetic kidney complication: Secondary | ICD-10-CM

## 2014-05-18 DIAGNOSIS — N049 Nephrotic syndrome with unspecified morphologic changes: Secondary | ICD-10-CM

## 2014-05-18 DIAGNOSIS — E118 Type 2 diabetes mellitus with unspecified complications: Secondary | ICD-10-CM | POA: Diagnosis not present

## 2014-05-18 DIAGNOSIS — I10 Essential (primary) hypertension: Secondary | ICD-10-CM

## 2014-05-18 HISTORY — DX: Nephrotic syndrome with unspecified morphologic changes: N04.9

## 2014-05-18 NOTE — Patient Instructions (Signed)
Please increase the metoprolol to 2 tablets each night (50 mg)     Nephrotic Syndrome Nephrotic syndrome is set of findings that show there is a problem with the kidneys. These findings include:   High levels of protein in urine (proteinuria).   High blood pressure (hypertension).   Low levels of the protein albumin in the blood (hypoalbuminemia).   High levels of cholesterol (hyperlipidemia) and triglycerides (hypertriglyceridemia) in the blood  Swelling of face, abdomen, arms and legs (edema). Nephrotic syndrome occurs when the kidneys' filters (glomeruli) are damaged. Glomeruli remove toxins and waste products from the bloodstream. As a result of damaged glomeruli, essential products such as proteins may also be removed from the bloodstream. The loss of proteins and other substances the body needs causes nephrotic syndrome. Nephrotic syndrome may increase your risk of further kidney damage and of health problems such as blood clots and infection.  CAUSES   A kidney disease that damages the glomeruli, such as:  Minimal change disease.   Focal segmental glomerulosclerosis.   Membranous nephropathy.   Glomerulonephritis.   A condition or disease that affects other parts of the body (systemic), such as:  Diabetes.  Autoimmune diseases, such as lupus.  Amyloidosis.  Multiple myeloma.  Some types of cancers.  An infection, such as hepatitis C.  Medicines such as:  Nonsteroidal anti-inflammatory drugs (NSAIDs).  Some anticancer drugs. In some cases, the cause of nephrotic syndrome is not known.  SYMPTOMS  You may not have noticeable symptoms. If symptoms are present, they may include:   Edema.  Foamy urine.  Unexplained weight gain.  Loss of appetite.  DIAGNOSIS  Nephrotic syndrome is usually diagnosed with dipstick urine test or a 24-hour urine collection. If your test shows that you have nephrotic syndrome, additional tests may be needed to determine  its cause. These may include blood, urine, imaging, or kidney biopsy tests.  TREATMENT  You may receive medicines to treat symptoms or to prevent complications from occurring. These medicines may:   Decrease inflammation in the kidneys.  Lower blood pressure.  Lower cholesterol.  Reduce the blood's ability to clot.  Help control edema. Further treatment will depend on the cause of your nephrotic syndrome. Your caregiver will discuss treatment options with you.  HOME CARE INSTRUCTIONS   Follow your prescribed diet.  Only take medicines as directed by your caregiver.  Do not take any medicines (including prescription medicines, over-the-counter medicines, or nutritional supplements) unless approved by your caregiver. Many medicines can make nephrotic syndrome worse or need to have the dose adjusted.  Keep all follow-up appointments as directed by your caregiver. SEEK MEDICAL CARE IF: Your symptoms do not go away as expected or you develop new symptoms.  Document Released: 11/12/2003 Document Revised: 09/13/2011 Document Reviewed: 07/25/2011 [facility] Patient Information 2015 [place], [place]. This information is not intended to replace advice given to you by your health care provider. Make sure you discuss any questions you have with your health care provider.

## 2014-05-18 NOTE — Progress Notes (Signed)
Subjective:  This chart was scribed for [name] MD, by Veverly FellsHatice Demirci,scribe, at [facility].  This patient was seen in room 12 and the patient's care was started at 12:04 PM.    Patient ID: Russell [name], male    DOB: February 28, 1970, 44 y.o.   MRN: [id number] Chief Complaint  Patient presents with   Follow-up    Hypertension and chest pain    HPI  HPI Comments: Russell BirkenheadSteven B [name] is a 44 y.o. male who presents to the [facility] for a follow up.   He states that he feels very fatigued and is having difficulty even walking to his mailbox at times.  He also has intermittent swelling in his lower extremities as well as high blood pressure.  He states he is controlling his sugar and is taking 20 units of Lantis and is also complaint with his other medication.   Patient is going to his nephrologist on May 26 th and is getting treated for nephrotic syndrome. Patient will be seeing Dr. Elvera LennoxGherghe for his diabetes at the end of this month.  Patient will be seeing Dr. Jens Somrenshaw, his cardiologist, in two days.  He has no other questions today.     Past Medical History  Diagnosis Date   Thyroid disease    Diabetes mellitus without complication    Hypertension     Current Outpatient Prescriptions on File Prior to Visit  Medication Sig Dispense Refill   ALPRAZolam (XANAX) 0.5 MG tablet TAKE 1 TABLET TWICE DAILY AS NEEDED FOR SLEEP 60 tablet 4   citalopram (CELEXA) 20 MG tablet Take 1 tablet (20 mg total) by mouth daily. 30 tablet 3   gabapentin (NEURONTIN) 600 MG tablet Take 600 mg by mouth 4 (four) times daily.      HYDROcodone-acetaminophen (NORCO/VICODIN) 5-325 MG per tablet Take 1 tablet by mouth every 6 (six) hours as needed for moderate pain.     insulin glargine (LANTUS) 100 UNIT/ML injection Inject 0.15 mLs (15 Units total) into the skin 2 (two) times daily. 10 mL 5   Insulin Lispro, Human, 200 UNIT/ML SOPN Inject 1-50 Units into the skin  daily. 1 unit per 5 grams of carbohydrates with meals/snacks plus coverage scale. Total 50 units/day 3 pen 2   levothyroxine (SYNTHROID, LEVOTHROID) 50 MCG tablet Take 1 tablet (50 mcg total) by mouth daily. 90 tablet 3   lisinopril (PRINIVIL,ZESTRIL) 40 MG tablet Take 1 tablet (40 mg total) by mouth daily. 90 tablet 3   metoprolol succinate (TOPROL XL) 25 MG 24 hr tablet Take 1 tablet (25 mg total) by mouth daily. 90 tablet 3   rivaroxaban (XARELTO) 20 MG TABS tablet Take 1 tablet (20 mg total) by mouth daily with supper. 30 tablet 1   torsemide (DEMADEX) 20 MG tablet Take 1 tablet (20 mg total) by mouth daily. 90 tablet 3   amLODipine (NORVASC) 5 MG tablet Take 1 tablet (5 mg total) by mouth daily. (Patient not taking: Reported on 05/18/2014) 30 tablet 1   metoCLOPramide (REGLAN) 10 MG tablet Take 5 mg by mouth 4 (four) times daily.     No current facility-administered medications on file prior to visit.    No Known Allergies     Review of Systems  Constitutional: Positive for fatigue. Negative for fever and chills.  Eyes: Negative for pain, discharge, redness and itching.  Gastrointestinal: Negative for nausea and vomiting.       Objective:   Physical  Exam  Constitutional: He appears well-developed.  HENT:  Head: Normocephalic and atraumatic.  Eyes: Right eye exhibits no discharge. Left eye exhibits no discharge.  Pulmonary/Chest: Effort normal. No respiratory distress.  Neurological: He is alert. Coordination normal.  Psychiatric: He has a normal mood and affect. His behavior is normal.  Nursing note and vitals reviewed.   Filed Vitals:   05/18/14 1148  BP: 170/116  Pulse: 97  Temp: 98.2 F (36.8 C)  TempSrc: Oral  Resp: 16  Height: 6' (1.829 m)  Weight: 266 lb (120.657 kg)  SpO2: 98%     Results for orders placed or performed in visit on 04/23/14  Glucose, Random  Result Value Ref Range   Glucose, Bld 145 (H) 70 - 99 mg/dL  C-peptide  Result Value Ref  Range   C-Peptide 1.31 0.80 - 3.90 ng/mL  Glutamic acid decarboxylase auto abs  Result Value Ref Range   Glutamic Acid Decarb Ab <5 <5 IU/mL  Anti-islet cell antibody  Result Value Ref Range   Pancreatic Islet Cell Antibody <5 < 5 JDF Units   Results for orders placed or performed in visit on 04/23/14  Glucose, Random  Result Value Ref Range   Glucose, Bld 145 (H) 70 - 99 mg/dL  C-peptide  Result Value Ref Range   C-Peptide 1.31 0.80 - 3.90 ng/mL  Glutamic acid decarboxylase auto abs  Result Value Ref Range   Glutamic Acid Decarb Ab <5 <5 IU/mL  Anti-islet cell antibody  Result Value Ref Range   Pancreatic Islet Cell Antibody <5 < 5 JDF Units   2+ pedal edema with excoriations anteriorly      Assessment & Plan:   This chart was scribed in my presence and reviewed by me personally.    ICD-9-CM ICD-10-CM   1. Nephrotic syndrome 581.9 N04.9 Ambulatory referral to Nephrology  2. Accelerated hypertension 401.0 I10 Ambulatory referral to Nephrology  3. Type 2 diabetes mellitus with other diabetic kidney complication 250.40 E11.29 Ambulatory referral to Nephrology     Signed, [name], MD

## 2014-05-19 NOTE — Progress Notes (Signed)
HPI: FU edema. Patient treated for bilateral lower extremity cellulitis in January 2016; venous ultrasound of lower extremities showed no DVT. 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 Possible superficial thrombophlebitis. Admitted in March 2016 with nausea and vomiting. Felt to be in DKA which improved with insulin and IV fluids. Urinalysis March 2016 showed greater than 300 mg/dL of protein. Normal gastric emptying study. Right upper quadrant ultrasound unremarkable. EGD showed severe erosive esophagitis. Chest x-ray April 2016 showed no congestive heart failure. BUN and creatinine 15 and 0.98. Albumin 2.8. Normal TSH. Cortisol level 6.9. VQ scan April 2016 normal. 24 hour urine for protein April 2016 showed 7500 mg. Echocardiogram May 2016 showed normal LV function. There was moderate left ventricular hypertrophy. Renal Dopplers May 2016 normal. At last office visit diuretics were increased. Follow-up BNP 41.7. Patient has been referred to nephrology for nephrotic range proteinuria. Since last seen, he has some dyspnea on exertion but improved. He continues to have occasional pedal edema but this is also improved. Occasional sharp pain in his left chest area after exertion.  Current Outpatient Prescriptions  Medication Sig Dispense Refill  . ALPRAZolam (XANAX) 0.5 MG tablet TAKE 1 TABLET TWICE DAILY AS NEEDED FOR SLEEP 60 tablet 4  . citalopram (CELEXA) 20 MG tablet Take 1 tablet (20 mg total) by mouth daily. 30 tablet 3  . gabapentin (NEURONTIN) 600 MG tablet Take 600 mg by mouth 4 (four) times daily.     Marland Kitchen. HYDROcodone-acetaminophen (NORCO/VICODIN) 5-325 MG per tablet Take 1 tablet by mouth every 6 (six) hours as needed for moderate pain.    Marland Kitchen. insulin glargine (LANTUS) 100 UNIT/ML injection Inject 0.15 mLs (15 Units total) into the skin 2 (two) times daily. 10 mL 5  . Insulin Lispro, Human, 200 UNIT/ML SOPN Inject 1-50 Units into the skin daily. 1 unit per 5  grams of carbohydrates with meals/snacks plus coverage scale. Total 50 units/day 3 pen 2  . levothyroxine (SYNTHROID, LEVOTHROID) 50 MCG tablet Take 1 tablet (50 mcg total) by mouth daily. 90 tablet 3  . lisinopril (PRINIVIL,ZESTRIL) 40 MG tablet Take 1 tablet (40 mg total) by mouth daily. 90 tablet 3  . metoprolol succinate (TOPROL XL) 25 MG 24 hr tablet Take 1 tablet (25 mg total) by mouth daily. 90 tablet 3  . rivaroxaban (XARELTO) 20 MG TABS tablet Take 1 tablet (20 mg total) by mouth daily with supper. 30 tablet 1  . torsemide (DEMADEX) 20 MG tablet Take 1 tablet (20 mg total) by mouth daily. 90 tablet 3   No current facility-administered medications for this visit.     Past Medical History  Diagnosis Date  . Thyroid disease   . Diabetes mellitus without complication   . Hypertension     Past Surgical History  Procedure Laterality Date  . Right ankle fx repair    . Eye surgery    . Esophagogastroduodenoscopy N/A 03/29/2014    Procedure: ESOPHAGOGASTRODUODENOSCOPY (EGD);  Surgeon: Dorena CookeyJohn Hayes, MD;  Location: [facility];  Service: Endoscopy;  Laterality: N/A;  . Tonsillectomy and adenoidectomy      History   Social History  . Marital Status: Single    Spouse Name: N/A  . Number of Children: N/A  . Years of Education: N/A   Occupational History  .      Maintenance [employer]   Social History Main Topics  . Smoking status: Never Smoker   . Smokeless tobacco: Not on file  .  Alcohol Use: No  . Drug Use: No  . Sexual Activity: Yes   Other Topics Concern  . Not on file   Social History Narrative   Patient currently lives with his fiance   Works with facilities   Nonsmoker, nondrinker       ROS: no fevers or chills, productive cough, hemoptysis, dysphasia, odynophagia, melena, hematochezia, dysuria, hematuria, rash, seizure activity, orthopnea, PND, pedal edema, claudication. Remaining systems are negative.  Physical Exam: Well-developed well-nourished in no  acute distress.  Skin is warm and dry.  HEENT is normal.  Neck is supple.  Chest is clear to auscultation with normal expansion.  Cardiovascular exam is regular rate and rhythm.  Abdominal exam nontender or distended. No masses palpated. Extremities show trace edema, excoriations noted. neuro grossly intact

## 2014-05-20 ENCOUNTER — Ambulatory Visit (INDEPENDENT_AMBULATORY_CARE_PROVIDER_SITE_OTHER): Payer: Managed Care, Other (non HMO) | Admitting: Cardiology

## 2014-05-20 ENCOUNTER — Encounter: Payer: Self-pay | Admitting: *Deleted

## 2014-05-20 ENCOUNTER — Encounter: Payer: Self-pay | Admitting: Cardiology

## 2014-05-20 VITALS — BP 168/108 | HR 92 | Ht 72.0 in | Wt 262.0 lb

## 2014-05-20 DIAGNOSIS — N049 Nephrotic syndrome with unspecified morphologic changes: Secondary | ICD-10-CM | POA: Diagnosis not present

## 2014-05-20 DIAGNOSIS — R072 Precordial pain: Secondary | ICD-10-CM

## 2014-05-20 DIAGNOSIS — R079 Chest pain, unspecified: Secondary | ICD-10-CM | POA: Diagnosis not present

## 2014-05-20 DIAGNOSIS — I10 Essential (primary) hypertension: Secondary | ICD-10-CM | POA: Diagnosis not present

## 2014-05-20 DIAGNOSIS — R609 Edema, unspecified: Secondary | ICD-10-CM

## 2014-05-20 MED ORDER — METOPROLOL SUCCINATE ER 100 MG PO TB24: 100.0000 mg | ORAL_TABLET | Freq: Every day | ORAL | Status: DC

## 2014-05-20 NOTE — Assessment & Plan Note (Signed)
Symptoms atypical. Schedule nuclear study for risk stratification. 

## 2014-05-20 NOTE — Patient Instructions (Signed)
Your physician recommends that you schedule a follow-up appointment in: 3 MONTHS WITH DR Jens SomRENSHAW  Your physician has requested that you have a lexiscan myoview. For further information please visit [email or web address] Please follow instruction sheet, as given.   INCREASE METOPROLOL TO 100 MG ONCE DAILY= 4 OF 25 MG TABLETS ONCE DAILY

## 2014-05-20 NOTE — Assessment & Plan Note (Signed)
Improved. Continue present dose of diuretics.

## 2014-05-20 NOTE — Assessment & Plan Note (Signed)
Blood pressure is elevated. Increase Toprol 100 mg daily. Further adjustments based on follow-up readings. His pedal edema did improve following discontinuation of Norvasc. However his diuretics were increased at that time as well.

## 2014-05-20 NOTE — Assessment & Plan Note (Signed)
Previous 24-hour urine showed 7500 mg of protein. Question if nephrotic syndrome has contributed to edema. Continue present dose of Demadex. He is scheduled to see nephrology.

## 2014-05-26 ENCOUNTER — Ambulatory Visit (INDEPENDENT_AMBULATORY_CARE_PROVIDER_SITE_OTHER): Payer: Managed Care, Other (non HMO)

## 2014-05-26 ENCOUNTER — Ambulatory Visit (INDEPENDENT_AMBULATORY_CARE_PROVIDER_SITE_OTHER): Payer: Managed Care, Other (non HMO) | Admitting: Family Medicine

## 2014-05-26 VITALS — BP 148/93 | HR 92 | Temp 98.9°F | Resp 24 | Ht 72.0 in | Wt 258.4 lb

## 2014-05-26 DIAGNOSIS — M79605 Pain in left leg: Secondary | ICD-10-CM | POA: Diagnosis not present

## 2014-05-26 DIAGNOSIS — R0600 Dyspnea, unspecified: Secondary | ICD-10-CM

## 2014-05-26 DIAGNOSIS — M79604 Pain in right leg: Secondary | ICD-10-CM

## 2014-05-26 DIAGNOSIS — N049 Nephrotic syndrome with unspecified morphologic changes: Secondary | ICD-10-CM

## 2014-05-26 LAB — POCT URINALYSIS DIPSTICK
Bilirubin, UA: NEGATIVE
Glucose, UA: 500
Ketones, UA: NEGATIVE
Leukocytes, UA: NEGATIVE
Nitrite, UA: NEGATIVE
Protein, UA: >=300
Spec Grav, UA: 1.02
Urobilinogen, UA: 0.2
pH, UA: 5.5

## 2014-05-26 LAB — GLUCOSE, POCT (MANUAL RESULT ENTRY): POC Glucose: 317 mg/dl — AB (ref 70–99)

## 2014-05-26 LAB — POCT CBC
Granulocyte percent: 68.1 %G (ref 37–80)
HCT, POC: 43 % — AB (ref 43.5–53.7)
Hemoglobin: 14.2 g/dL (ref 14.1–18.1)
Lymph, poc: 1.7 (ref 0.6–3.4)
MCH, POC: 28 pg (ref 27–31.2)
MCHC: 33.1 g/dL (ref 31.8–35.4)
MCV: 84.6 fL (ref 80–97)
MID (cbc): 0.6 (ref 0–0.9)
MPV: 7.6 fL (ref 0–99.8)
POC Granulocyte: 5 (ref 2–6.9)
POC LYMPH PERCENT: 23.2 %L (ref 10–50)
POC MID %: 8.7 %M (ref 0–12)
Platelet Count, POC: 313 10*3/uL (ref 142–424)
RBC: 5.09 M/uL (ref 4.69–6.13)
RDW, POC: 13.4 %
WBC: 7.3 10*3/uL (ref 4.6–10.2)

## 2014-05-26 LAB — POCT UA - MICROSCOPIC ONLY
Bacteria, U Microscopic: NEGATIVE
Casts, Ur, LPF, POC: NEGATIVE
Crystals, Ur, HPF, POC: NEGATIVE
Mucus, UA: NEGATIVE
Yeast, UA: NEGATIVE

## 2014-05-26 LAB — POCT GLYCOSYLATED HEMOGLOBIN (HGB A1C): Hemoglobin A1C: 8.4

## 2014-05-26 MED ORDER — TRAMADOL HCL 50 MG PO TABS: 50.0000 mg | ORAL_TABLET | Freq: Three times a day (TID) | ORAL | Status: DC | PRN

## 2014-05-26 NOTE — Progress Notes (Addendum)
Patient ID: Russell [name], male   DOB: Dec 13, 1970, 44 y.o.   MRN: [id number]   This chart was scribed for [name], MD by [facility], medical scribe at [facility].The patient was seen in exam room 11 and the patient's care was started at 7:27 PM.  Patient ID: Russell [name] MRN: [id number], DOB: 06/27/1970, 44 y.o. Date of Encounter: 05/26/2014  Primary Physician: [name], MD  Chief Complaint:  Chief Complaint  Patient presents with  . Follow-up    Leg Pain  . Depression    Per Triage Screening    HPI:  Russell [name] is a 44 y.o. male who presents to [facility] here for a follow up for leg pain.   Bilateral leg pain Legs[place] worsening. He did fall a week ago, his wife states the wound is healing.  He has not been seen by a nephrologist yet. He also needs a note for disability  Lower back pain Pt is also complaining of lower back pain, which began three downs ago and gradually worsening. Pt says he has also had hot/cold spells.  Depression: Celexa has been helpful, but he feels stuck and trapped due to his current issues.  Wheezing: He says he still has wheezes but only when he lays down. He sleeps with several pillows.  Past Medical History  Diagnosis Date  . Thyroid disease   . Diabetes mellitus without complication   . Hypertension     Home Meds: Prior to Admission medications   Medication Sig Start Date End Date Taking? Authorizing Provider  ALPRAZolam Prudy Feeler) 0.5 MG tablet TAKE 1 TABLET TWICE DAILY AS NEEDED FOR SLEEP 09/08/13  Yes [name], MD  citalopram (CELEXA) 20 MG tablet Take 1 tablet (20 mg total) by mouth daily. 04/10/14  Yes [name], MD  cyclobenzaprine (FLEXERIL) 10 MG tablet Take 10 mg by mouth at bedtime.   Yes Historical Provider, MD  gabapentin (NEURONTIN) 600 MG tablet Take 600 mg by mouth 4 (four) times daily.  12/19/11  Yes [name], MD  HYDROcodone-acetaminophen  (NORCO/VICODIN) 5-325 MG per tablet Take 1 tablet by mouth every 6 (six) hours as needed for moderate pain.   Yes Historical Provider, MD  ibuprofen (ADVIL,MOTRIN) 800 MG tablet Take 800 mg by mouth 4 (four) times daily as needed.   Yes Historical Provider, MD  insulin glargine (LANTUS) 100 UNIT/ML injection Inject 0.15 mLs (15 Units total) into the skin 2 (two) times daily. 04/06/14  Yes Leana Roe Elgergawy, MD  Insulin Lispro, Human, 200 UNIT/ML SOPN Inject 1-50 Units into the skin daily. 1 unit per 5 grams of carbohydrates with meals/snacks plus coverage scale. Total 50 units/day 05/01/14  Yes Carlus Pavlov, MD  levothyroxine (SYNTHROID, LEVOTHROID) 50 MCG tablet Take 1 tablet (50 mcg total) by mouth daily. 05/02/13  Yes [name], MD  lisinopril (PRINIVIL,ZESTRIL) 40 MG tablet Take 1 tablet (40 mg total) by mouth daily. 04/22/14  Yes Lewayne Bunting, MD  metoprolol succinate (TOPROL-XL) 100 MG 24 hr tablet Take 1 tablet (100 mg total) by mouth daily. 05/20/14  Yes Lewayne Bunting, MD  rivaroxaban (XARELTO) 20 MG TABS tablet Take 1 tablet (20 mg total) by mouth daily with supper. 03/13/14  Yes [name], MD  torsemide (DEMADEX) 20 MG tablet Take 1 tablet (20 mg total) by mouth daily. 04/22/14  Yes Lewayne Bunting, MD   Allergies: No Known Allergies  History   Social History  .  Marital Status: Single    Spouse Name: N/A  . Number of Children: N/A  . Years of Education: N/A   Occupational History  .      Maintenance [employer]   Social History Main Topics  . Smoking status: Never Smoker   . Smokeless tobacco: Never Used  . Alcohol Use: No  . Drug Use: No  . Sexual Activity: Yes   Other Topics Concern  . Not on file   Social History Narrative   Patient currently lives with his fiance   Works with facilities   Nonsmoker, nondrinker       Review of Systems: Constitutional: negative for chills, fever, night sweats, weight changes, or fatigue  HEENT: negative for vision  changes, hearing loss, congestion, rhinorrhea, ST, epistaxis, or sinus pressure Cardiovascular: negative for chest pain or palpitations Respiratory: negative for hemoptysis, shortness of breath, or cough. Positive for wheezing Abdominal: negative for abdominal pain, nausea, vomiting, diarrhea, or constipation Dermatological: negative for rash Msk: positive for lower back pain and bilateral leg pain. Neurologic: negative for headache, dizziness, or syncope All other systems reviewed and are otherwise negative with the exception to those above and in the HPI.  Physical Exam: Blood pressure 148/93, pulse 92, temperature 98.9 F (37.2 C), temperature source Oral, resp. rate 24, height 6' (1.829 m), weight 258 lb 6 oz (117.198 kg), SpO2 98 %., Body mass index is 35.03 kg/(m^2). General: Well developed, well nourished, in no acute distress. Head: Normocephalic, atraumatic, eyes without discharge, sclera non-icteric, nares are without discharge. Bilateral auditory canals clear, TM's are without perforation, pearly grey and translucent with reflective cone of light bilaterally. Oral cavity moist, posterior pharynx without exudate, erythema, peritonsillar abscess, or post nasal drip.  Neck: Supple. No thyromegaly. Full ROM. No lymphadenopathy. Lungs: Clear bilaterally to auscultation without wheezes, rales, or rhonchi. Breathing is unlabored. Heart: RRR with S1 S2. No murmurs, rubs, or gallops appreciated. Abdomen: Soft, non-tender, non-distended with normoactive bowel sounds. No hepatomegaly. No rebound/guarding. No obvious abdominal masses. Msk:  Strength and tone normal for age. Extremities/Skin: Warm and dry. No clubbing or cyanosis. No edema. No rashes or suspicious lesions. Neuro: Alert and oriented X 3. Moves all extremities spontaneously. Gait is normal. CNII-XII grossly in tact. Psych:  Responds to questions appropriately with a normal affect.   Labs: Results for orders placed or performed in  visit on 05/26/14  POCT glucose (manual entry)  Result Value Ref Range   POC Glucose 317 (A) 70 - 99 mg/dl  POCT glycosylated hemoglobin (Hb A1C)  Result Value Ref Range   Hemoglobin A1C 8.4   POCT UA - Microscopic Only  Result Value Ref Range   WBC, Ur, HPF, POC 0-1    RBC, urine, microscopic 0-4    Bacteria, U Microscopic neg    Mucus, UA neg    Epithelial cells, urine per micros 0-1    Crystals, Ur, HPF, POC neg    Casts, Ur, LPF, POC neg    Yeast, UA neg   POCT urinalysis dipstick  Result Value Ref Range   Color, UA yellow    Clarity, UA clear    Glucose, UA 500    Bilirubin, UA neg    Ketones, UA neg    Spec Grav, UA 1.020    Blood, UA small    pH, UA 5.5    Protein, UA >=300    Urobilinogen, UA 0.2    Nitrite, UA neg    Leukocytes, UA Negative   POCT  CBC  Result Value Ref Range   WBC 7.3 4.6 - 10.2 K/uL   Lymph, poc 1.7 0.6 - 3.4   POC LYMPH PERCENT 23.2 10 - 50 %L   MID (cbc) 0.6 0 - 0.9   POC MID % 8.7 0 - 12 %M   POC Granulocyte 5.0 2 - 6.9   Granulocyte percent 68.1 37 - 80 %G   RBC 5.09 4.69 - 6.13 M/uL   Hemoglobin 14.2 14.1 - 18.1 g/dL   HCT, POC 19.1 (A) 47.8 - 53.7 %   MCV 84.6 80 - 97 fL   MCH, POC 28.0 27 - 31.2 pg   MCHC 33.1 31.8 - 35.4 g/dL   RDW, POC 29.5 %   Platelet Count, POC 313 142 - 424 K/uL   MPV 7.6 0 - 99.8 fL   [facility] reading (PRIMARY) by  Dr. Milus Glazier:  No acute disease seen on CXR explaining orthopnea..  I spent 45 minutes talking to [name] and his fiance. ASSESSMENT AND PLAN:  44 y.o. year old male with depression, uncontrolled diabetes, diabetic neuropathy, nephrotic syndrome. I have her pubic try to get the patient into nephrology. I been unsuccessful so far. I will try again tomorrow. This chart was scribed in my presence and reviewed by me personally.    ICD-9-CM ICD-10-CM   1. Leg pain, bilateral 729.5 M79.604 POCT glucose (manual entry)    M79.605 POCT glycosylated hemoglobin (Hb A1C)     COMPLETE METABOLIC PANEL  WITH GFR     POCT UA - Microscopic Only     POCT urinalysis dipstick     POCT CBC     Ambulatory referral to Nephrology     US Venous Img Lower Bilateral  2. Nephrotic syndrome 581.9 N04.9 POCT glucose (manual entry)     POCT glycosylated hemoglobin (Hb A1C)     COMPLETE METABOLIC PANEL WITH GFR     POCT UA - Microscopic Only     POCT urinalysis dipstick     POCT CBC     Ambulatory referral to Nephrology     US Venous Img Lower Bilateral  3. Dyspnea 786.09 R06.00 DG Chest 2 View     DG Chest 2 View     Ambulatory referral to Nephrology     US Venous Img Lower Bilateral   Signed, [name], MD 05/26/2014 7:27 PM

## 2014-05-27 ENCOUNTER — Ambulatory Visit (HOSPITAL_COMMUNITY)
Admission: RE | Admit: 2014-05-27 | Discharge: 2014-05-27 | Disposition: A | Discharge status: Home / Self Care | Payer: Managed Care, Other (non HMO) | Source: Ambulatory Visit | Attending: Family Medicine | Admitting: Family Medicine

## 2014-05-27 ENCOUNTER — Telehealth: Payer: Self-pay | Admitting: Radiology

## 2014-05-27 ENCOUNTER — Encounter (HOSPITAL_COMMUNITY): Payer: Managed Care, Other (non HMO)

## 2014-05-27 ENCOUNTER — Other Ambulatory Visit: Payer: Self-pay | Admitting: Radiology

## 2014-05-27 DIAGNOSIS — M79604 Pain in right leg: Secondary | ICD-10-CM | POA: Diagnosis not present

## 2014-05-27 DIAGNOSIS — I82811 Embolism and thrombosis of superficial veins of right lower extremities: Secondary | ICD-10-CM | POA: Diagnosis not present

## 2014-05-27 DIAGNOSIS — M79605 Pain in left leg: Secondary | ICD-10-CM | POA: Diagnosis not present

## 2014-05-27 DIAGNOSIS — I82890 Acute embolism and thrombosis of other specified veins: Secondary | ICD-10-CM | POA: Diagnosis not present

## 2014-05-27 DIAGNOSIS — D689 Coagulation defect, unspecified: Secondary | ICD-10-CM

## 2014-05-27 LAB — COMPLETE METABOLIC PANEL WITH GFR
ALT: 22 U/L (ref 0–53)
AST: 16 U/L (ref 0–37)
Albumin: 3.1 g/dL — ABNORMAL LOW (ref 3.5–5.2)
Alkaline Phosphatase: 97 U/L (ref 39–117)
BUN: 34 mg/dL — ABNORMAL HIGH (ref 6–23)
CO2: 28 mEq/L (ref 19–32)
Calcium: 8.7 mg/dL (ref 8.4–10.5)
Chloride: 102 mEq/L (ref 96–112)
Creat: 1.56 mg/dL — ABNORMAL HIGH (ref 0.50–1.35)
GFR, Est African American: 62 mL/min
GFR, Est Non African American: 54 mL/min — ABNORMAL LOW
Glucose, Bld: 338 mg/dL — ABNORMAL HIGH (ref 70–99)
Potassium: 4.6 mEq/L (ref 3.5–5.3)
Sodium: 136 mEq/L (ref 135–145)
Total Bilirubin: 0.3 mg/dL (ref 0.2–1.2)
Total Protein: 5.9 g/dL — ABNORMAL LOW (ref 6.0–8.3)

## 2014-05-27 NOTE — Telephone Encounter (Signed)
Got results from Doppler. Gave them to Dr Milus GlazierLauenstein over the phone. Per Dr L, call pt and tell him he has some superficial clotting, nothing dangerous that would result in sudden death or anything like that. Per Dr L, refer him to Dr Myna HidalgoEnnever for superficial clotting. Also, need to know where he wants us to fax his disability note. He will call us back with this number. Pt notified of all of this.

## 2014-05-27 NOTE — Addendum Note (Signed)
Addended by: Johnnette LitterARDWELL, Telly Jawad M on: 05/27/2014 08:27 AM   Modules accepted: Orders

## 2014-05-27 NOTE — Progress Notes (Signed)
VASCULAR LAB PRELIMINARY  PRELIMINARY  PRELIMINARY  PRELIMINARY  Bilateral lower extremity venous duplex completed.    Preliminary report:  Right - No evidence of deep vein thrombosis. Positive for residual superficial thrombosis of the greater saphenous vein and varicosities of the calf. Also noted is acute superficial thrombosis of a approximate 6 inch segment in a varicose vein mid calf at the area of discomfort. Left - No evidence of deep vein thrombosis or obvious superficial thrombosis. Bilateral - No evidence of a baker's cyst.  Mattix Imhof, RVS 05/27/2014, 2:20 PM

## 2014-05-28 ENCOUNTER — Other Ambulatory Visit: Payer: Self-pay | Admitting: Family Medicine

## 2014-05-28 DIAGNOSIS — I82403 Acute embolism and thrombosis of unspecified deep veins of lower extremity, bilateral: Secondary | ICD-10-CM

## 2014-05-29 ENCOUNTER — Telehealth (HOSPITAL_COMMUNITY): Payer: Self-pay

## 2014-05-29 ENCOUNTER — Ambulatory Visit: Payer: Managed Care, Other (non HMO) | Admitting: Internal Medicine

## 2014-05-29 NOTE — Telephone Encounter (Signed)
Encounter complete. 

## 2014-06-03 ENCOUNTER — Ambulatory Visit (HOSPITAL_COMMUNITY)
Admission: RE | Admit: 2014-06-03 | Discharge: 2014-06-03 | Disposition: A | Discharge status: Home / Self Care | Payer: Managed Care, Other (non HMO) | Source: Ambulatory Visit | Attending: Cardiology | Admitting: Cardiology

## 2014-06-03 DIAGNOSIS — R079 Chest pain, unspecified: Secondary | ICD-10-CM | POA: Diagnosis not present

## 2014-06-03 LAB — MYOCARDIAL PERFUSION IMAGING
CHL CUP NUCLEAR SDS: 2
CHL CUP STRESS STAGE 1 GRADE: 0 %
CHL CUP STRESS STAGE 1 SBP: 164 mmHg
CHL CUP STRESS STAGE 2 GRADE: 0 %
CHL CUP STRESS STAGE 2 SPEED: 0 mph
CHL CUP STRESS STAGE 4 HR: 82 {beats}/min
CHL CUP STRESS STAGE 4 SPEED: 0 mph
CSEPPBP: 173 mmHg
CSEPPMHR: 43 %
Estimated workload: 1 METS
LV dias vol: 144 mL
LVSYSVOL: 84 mL
NUC STRESS TID: 1.19
Nuc Stress EF: 42 %
Peak HR: 77 {beats}/min
Rest HR: 72 {beats}/min
SRS: 1
SSS: 3
Stage 1 DBP: 111 mmHg
Stage 1 HR: 69 {beats}/min
Stage 1 Speed: 0 mph
Stage 2 HR: 69 {beats}/min
Stage 3 DBP: 103 mmHg
Stage 3 Grade: 0 %
Stage 3 HR: 77 {beats}/min
Stage 3 SBP: 173 mmHg
Stage 3 Speed: 0 mph
Stage 4 DBP: 106 mmHg
Stage 4 Grade: 0 %
Stage 4 SBP: 172 mmHg

## 2014-06-03 MED ORDER — REGADENOSON 0.4 MG/5ML IV SOLN
0.4000 mg | Freq: Once | INTRAVENOUS | Status: AC
  Administered 2014-06-03: 0.4 mg via INTRAVENOUS

## 2014-06-03 MED ORDER — TECHNETIUM TC 99M SESTAMIBI GENERIC - CARDIOLITE
10.9000 | Freq: Once | INTRAVENOUS | Status: AC | PRN
  Administered 2014-06-03: 10.9 via INTRAVENOUS

## 2014-06-03 MED ORDER — TECHNETIUM TC 99M SESTAMIBI GENERIC - CARDIOLITE
31.0000 | Freq: Once | INTRAVENOUS | Status: AC | PRN
  Administered 2014-06-03: 31 via INTRAVENOUS

## 2014-06-08 ENCOUNTER — Encounter: Payer: Self-pay | Admitting: Hematology and Oncology

## 2014-06-08 ENCOUNTER — Inpatient Hospital Stay: Payer: Managed Care, Other (non HMO) | Attending: Hematology and Oncology | Admitting: Hematology and Oncology

## 2014-06-08 VITALS — BP 160/102 | HR 114 | Temp 97.8°F | Resp 16 | Ht 72.0 in | Wt 259.9 lb

## 2014-06-08 DIAGNOSIS — Z86718 Personal history of other venous thrombosis and embolism: Secondary | ICD-10-CM | POA: Insufficient documentation

## 2014-06-08 DIAGNOSIS — I10 Essential (primary) hypertension: Secondary | ICD-10-CM | POA: Diagnosis not present

## 2014-06-08 DIAGNOSIS — Z794 Long term (current) use of insulin: Secondary | ICD-10-CM | POA: Diagnosis not present

## 2014-06-08 DIAGNOSIS — E119 Type 2 diabetes mellitus without complications: Secondary | ICD-10-CM | POA: Diagnosis not present

## 2014-06-08 DIAGNOSIS — F419 Anxiety disorder, unspecified: Secondary | ICD-10-CM | POA: Insufficient documentation

## 2014-06-08 DIAGNOSIS — Z79899 Other long term (current) drug therapy: Secondary | ICD-10-CM | POA: Insufficient documentation

## 2014-06-08 DIAGNOSIS — Z7901 Long term (current) use of anticoagulants: Secondary | ICD-10-CM | POA: Diagnosis not present

## 2014-06-08 DIAGNOSIS — N049 Nephrotic syndrome with unspecified morphologic changes: Secondary | ICD-10-CM

## 2014-06-08 DIAGNOSIS — Z8781 Personal history of (healed) traumatic fracture: Secondary | ICD-10-CM

## 2014-06-08 DIAGNOSIS — E079 Disorder of thyroid, unspecified: Secondary | ICD-10-CM | POA: Diagnosis not present

## 2014-06-08 DIAGNOSIS — I82403 Acute embolism and thrombosis of unspecified deep veins of lower extremity, bilateral: Secondary | ICD-10-CM

## 2014-06-08 NOTE — Progress Notes (Signed)
[facility]  Clinic day:  06/08/2014  Chief Complaint: Russell [name] is an 44 y.o. male with nephrotic syndrome and deep venous thrombosis who is referred in consultation by Dr. [name] for assessment and management.  HPI: The patient notes a history of diabetes.  He states that over the past 6 months he has gained 58 pounds secondary to swelling in his legs, chest, and neck. Notes indicate that he was treated with treated for bilateral lower extremity cellulitis in January 2016. Duplex study on 02/01/2014 no evidence of deep venous thromboses but a thrombosed varicosity in the right lower leg near the knee.  The patient states that he was diagnosed with bilateral lower extremity DVTs in [place] at a [facility] facility. He was initially started on Xarelto 15 mg twice a day then switched to 20 mg a day.  He was admitted from 03/22- 03/29/2014 to [facility] with nausea and vomiting secondary to diabetic ketoacidosis. He was treated with insulin and fluids.  Right upper quadrant ultrasound on 03/27/2014 was negative.  EGD on 03/29/2014 revealed severe erosive esophagitis.  He was discharged home on Protonix and Carafate. He was felt to have possible gastroparesis.  Urinalysis revealed greater than 300 mg/dL of protein.  The patient was admitted to [facility] from 04/03- 04/04//2016 with nausea and vomiting and poorly controlled diabetes. Hemoglobin A1c was 10.3.  He tolerated a full liquid diet well. Gastric emptying study on 04/06/2014 was normal. CXR on 04/06/2014 was normal.  BUN was 15 and creatinine 0.98.  Patient has had several follow-up studies. 24 hour urine on 04/29/2014 revealed 7500 mg protein.  Pancreatic islet cell antibody on 04/23/2014 was negative.  VQ scan on 04/23/2014 was negative.  Echocardiogram on 05/05/2014 revealed ejection fraction of 55-60%  Myocardial perfusion study on 06/03/2014 was normal except for a moderate decrease in  ejection fraction (30-44%).  Available labs from 05/26/2014 included a hematocrit of 43, hemoglobin 14.2, MCV 84.6, platelets 313,000 and white count 7300. BUN was 34, creatinine 1.56 and estimated GFR of 54 ml/minute.  Protein was 5.9 and albumin 3.1 (prior value 2.8).   The patient notes that the follow-up ultrasound revealed that the left-sided DVT had resolved. He still had a DVT on the right side and also a new site. Preliminary duplex on 05/27/2014 revealed no evidence of DVT on the right but a residual superficial thrombosis of the greater saphenous vein and varicosities of the calf. There was acute superficial thrombosis of an approximate 6 inch segment in a varicose vein in the mid calf. There was no evidence of deep or superficial thrombosis on the left side.  He notes ongoing discomfort in his legs. He states he has to lay in bed as he is unable to walk secondary to pain in his feet. He notes bruising on his ankles and legs.  He notes discolored feet (blue) at times.  He believes he had a PET scan and a renal scan (no result available; unclear if performed).  He was seen by urology and underwent cystoscopy. He has not seen nephrology.   Past Medical History  Diagnosis Date  . Thyroid disease   . Diabetes mellitus without complication   . Hypertension   . Nephrotic syndrome 05/18/2014  . DVT (deep venous thrombosis), H/o 01/2014-on Xarelto 03/24/2014  . Secondary DM with DKA-AG=16, BIcarb Nl 03/24/2014    Past Surgical History  Procedure Laterality Date  . Right ankle fx repair    .  Eye surgery    . Esophagogastroduodenoscopy N/A 03/29/2014    Procedure: ESOPHAGOGASTRODUODENOSCOPY (EGD);  Surgeon: Dorena CookeyJohn Hayes, MD;  [facility];  Service: Endoscopy;  Laterality: N/A;  . Tonsillectomy and adenoidectomy      Family History  Problem Relation Age of Onset  . Obesity Mother     Patient states that family members have no other medical illnesses other than what I have described   . Heart disease      No family history  . Cancer Father 1850    AML    Social History:  reports that he has never smoked. He has never used smokeless tobacco. He reports that he does not drink alcohol or use illicit drugs.  The patient is alone today.  Allergies: No Known Allergies  Current Medications: Current Outpatient Prescriptions  Medication Sig Dispense Refill  . ALPRAZolam (XANAX) 0.5 MG tablet TAKE 1 TABLET TWICE DAILY AS NEEDED FOR SLEEP 60 tablet 4  . citalopram (CELEXA) 20 MG tablet Take 1 tablet (20 mg total) by mouth daily. 30 tablet 3  . cyclobenzaprine (FLEXERIL) 10 MG tablet Take 10 mg by mouth at bedtime.    . gabapentin (NEURONTIN) 600 MG tablet Take 600 mg by mouth 4 (four) times daily.     Marland Kitchen. HYDROcodone-acetaminophen (NORCO/VICODIN) 5-325 MG per tablet Take 1 tablet by mouth every 6 (six) hours as needed for moderate pain.    Marland Kitchen. ibuprofen (ADVIL,MOTRIN) 800 MG tablet Take 800 mg by mouth 4 (four) times daily as needed.    . insulin glargine (LANTUS) 100 UNIT/ML injection Inject 0.15 mLs (15 Units total) into the skin 2 (two) times daily. 10 mL 5  . Insulin Lispro, Human, 200 UNIT/ML SOPN Inject 1-50 Units into the skin daily. 1 unit per 5 grams of carbohydrates with meals/snacks plus coverage scale. Total 50 units/day 3 pen 2  . levothyroxine (SYNTHROID, LEVOTHROID) 50 MCG tablet Take 1 tablet (50 mcg total) by mouth daily. 90 tablet 3  . lisinopril (PRINIVIL,ZESTRIL) 40 MG tablet Take 1 tablet (40 mg total) by mouth daily. 90 tablet 3  . metoprolol succinate (TOPROL-XL) 100 MG 24 hr tablet Take 1 tablet (100 mg total) by mouth daily. 90 tablet 3  . rivaroxaban (XARELTO) 20 MG TABS tablet Take 1 tablet (20 mg total) by mouth daily with supper. 30 tablet 1  . torsemide (DEMADEX) 20 MG tablet Take 1 tablet (20 mg total) by mouth daily. 90 tablet 3  . traMADol (ULTRAM) 50 MG tablet Take 1 tablet (50 mg total) by mouth every 8 (eight) hours as needed. 30 tablet 0   No  current facility-administered medications for this visit.    Review of Systems:  GENERAL:  Feels bad.  No fevers or sweats.  Weight gain of 58 pounds in 6 months.. PERFORMANCE STATUS (ECOG):  2 HEENT:  No visual changes, runny nose, sore throat, mouth sores or tenderness. Lungs: No shortness of breath or cough.  No hemoptysis. Cardiac:  No chest pain, palpitations, orthopnea, or PND.  Poorly controlled hypertension. GI:  No nausea, vomiting, diarrhea, constipation, melena or hematochezia. GU:  No urgency, frequency, dysuria, or hematuria. Musculoskeletal:  No back pain.  No joint pain.  No muscle tenderness. Extremities:  Pain and swelling in legs.  Feels like feet are burning. Skin:  No rashes or skin changes. Neuro:  No headache, numbness or weakness, balance or coordination issues. Endocrine:  Diabetes.  Hypothyroid.  No hot flashes or night sweats. Psych:  Anxiety.  No mood changes. Pain:  Pain in legs. Review of systems:  All other systems reviewed and found to be negative.   Physical Exam: Blood pressure 160/102, pulse 114, temperature 97.8 F (36.6 C), temperature source Tympanic, resp. rate 16, height 6' (1.829 m), weight 259 lb 14.8 oz (117.9 kg), SpO2 98 %.  GENERAL:  Well developed, well nourished, sitting comfortably in the exam room in no acute distress.  He ambulates slowly secondary to pain in his feet. MENTAL STATUS:  Alert and oriented to person, place and time. HEAD:  Short brown hair.  Normocephalic, atraumatic, face symmetric, no Cushingoid features. EYES:  Brown eyes.  Pupils equal round and reactive to light and accomodation.  No conjunctivitis or scleral icterus. ENT:  Oropharynx clear without lesion.  Tongue normal. Mucous membranes moist.  RESPIRATORY:  Clear to auscultation without rales, wheezes or rhonchi. CARDIOVASCULAR:  Regular rate and rhythm without murmur, rub or gallop. ABDOMEN:  Soft, non-tender, with active bowel sounds, and no hepatosplenomegaly.   No masses. SKIN:  No rashes or ulcers. EXTREMITIES: Bilateral lower extremity edema.  Right lower extremity shiny.  Ecchymosis between toes on the right.  Small flat blood blister beneath left great toe.  Toes cool.  Tender calf muscles bilaterally without appreciable cords. LYMPH NODES: No palpable cervical, supraclavicular, axillary or inguinal adenopathy  NEUROLOGICAL: Unremarkable. PSYCH:  Appropriate.   Assessment:  Russell [name] is an 44 y.o. male with nephrotic syndrome and a history of lower extremity thrombosis.  The patient indicates a history of a bilateral lower extremity DVT diagnosed in 01/2014 with recent follow-up ultrasound revealing resolution of clot on the left persistent clot on the right, but with a new area of thrombosis.   Official duplex reports from a 02/01/2014 revealed no evidence of deep venous thromboses but a thrombosed varicosity in the right leg near the knee. Preliminary duplex report on 05/27/2014 revealed no evidence of DVT in the right lower extremity. He has residual superficial thromboses of the greater saphenous vein and varicosities of the calf as well as an acute superficial thromboses of a 6 inch segment in a varicose vein the mid calf. There was no evidence of DVT or superficial thrombosis on the left. The patient has been on Xarelto since January 2016.  He has nephrotic range proteinuria. 24 hour urine protein revealed 7500 mg on 04/29/2014. CBC was normal on 05/26/2014. Albumin has ranged between 2.8 and 3.1.  Total serum protein was 5.9. Creatinine was 0.98 on 04/06/2014 and 1.56 on 05/26/2014 (CrCl 54 ml/min).  Symptomatically he notes significant pain in his lower extremities. Toes are cool and painful.  Plan: 1. Obtain records from [facility] facilities regarding workup to date. 2. Patient to sign release of information for all testing performed. 3. Discuss consideration of a limited hypercoagulable workup as likely etiology of thrombosis is nephrotic  syndrome.  Determine if any hypercoagulable workup was sent so as not repeat studies. 4. Discuss consideration of the following tests: 24 hour urine for UPEP and free light chain assay, serum protein electrophoresis, free light chain assay, immunoglobulin levels, CMP, hepatitis B and C testing, cryoglobulins, ANA, C3, C4, CH 50, lupus anticoagulant and anticardiolipin antibodies. 5. Discuss current continuation of Xarelto although unclear data regarding use in thromboses associated with nephrotic syndrome.  Typically patients treated with Lovenox or Coumadin. Concern raised for stability of renal function and use of  Xarelto (contraindicated if CrCl less than 30 mL/minute). 6. Contact patient once records reviewed to return for testing.  Patient's contact numbers [phone]. 7. Consider evaluation with vascular surgery (Dr. Wyn Quaker) to ensure no arterial issues. 8. Contact [facility] hematology and nephrology for expedited evaluation.  Rosey Bath, MD  06/09/2014, 12:19 PM

## 2014-06-09 ENCOUNTER — Encounter: Payer: Self-pay | Admitting: Hematology and Oncology

## 2014-06-13 ENCOUNTER — Other Ambulatory Visit: Payer: Self-pay | Admitting: Internal Medicine

## 2014-06-18 ENCOUNTER — Other Ambulatory Visit: Payer: Self-pay | Admitting: Physician Assistant

## 2014-06-19 ENCOUNTER — Other Ambulatory Visit: Payer: Self-pay | Admitting: Family Medicine

## 2014-06-19 DIAGNOSIS — N049 Nephrotic syndrome with unspecified morphologic changes: Secondary | ICD-10-CM

## 2014-06-24 ENCOUNTER — Other Ambulatory Visit: Payer: Self-pay | Admitting: Family Medicine

## 2014-06-29 ENCOUNTER — Other Ambulatory Visit: Payer: Self-pay

## 2014-07-09 DIAGNOSIS — R809 Proteinuria, unspecified: Secondary | ICD-10-CM | POA: Insufficient documentation

## 2014-07-15 ENCOUNTER — Other Ambulatory Visit: Payer: Self-pay | Admitting: Internal Medicine

## 2014-07-30 ENCOUNTER — Other Ambulatory Visit: Payer: Self-pay | Admitting: Family Medicine

## 2014-07-30 NOTE — Telephone Encounter (Signed)
Dr. Lauenstein pt. 

## 2014-07-30 NOTE — Telephone Encounter (Signed)
I provided a 30 day supply for this patient. Please have him f/u with Dr. Milus Glazier when he returns. Thank you!

## 2014-07-31 ENCOUNTER — Other Ambulatory Visit: Payer: Self-pay | Admitting: *Deleted

## 2014-07-31 ENCOUNTER — Ambulatory Visit (INDEPENDENT_AMBULATORY_CARE_PROVIDER_SITE_OTHER): Payer: Managed Care, Other (non HMO) | Admitting: Internal Medicine

## 2014-07-31 ENCOUNTER — Encounter: Payer: Self-pay | Admitting: Internal Medicine

## 2014-07-31 VITALS — BP 158/90 | HR 84 | Temp 97.5°F | Resp 16 | Ht 74.0 in | Wt 272.6 lb

## 2014-07-31 DIAGNOSIS — E039 Hypothyroidism, unspecified: Secondary | ICD-10-CM | POA: Diagnosis not present

## 2014-07-31 DIAGNOSIS — E1165 Type 2 diabetes mellitus with hyperglycemia: Secondary | ICD-10-CM | POA: Diagnosis not present

## 2014-07-31 DIAGNOSIS — E11319 Type 2 diabetes mellitus with unspecified diabetic retinopathy without macular edema: Secondary | ICD-10-CM

## 2014-07-31 DIAGNOSIS — IMO0002 Reserved for concepts with insufficient information to code with codable children: Secondary | ICD-10-CM

## 2014-07-31 MED ORDER — INSULIN LISPRO 200 UNIT/ML ~~LOC~~ SOPN: 33.0000 [IU] | PEN_INJECTOR | Freq: Three times a day (TID) | SUBCUTANEOUS | Status: DC

## 2014-07-31 MED ORDER — GLUCOSE BLOOD VI STRP: ORAL_STRIP | Status: DC

## 2014-07-31 NOTE — Progress Notes (Signed)
Patient ID: Russell [name], male   DOB: 1970-02-11, 44 y.o.   MRN: [id number]  HPI: Russell [name] is a 44 y.o.-year-old male, returning for f/u for DM2, dx in 2011, insulin-dependent 2014, uncontrolled, with complications (DR, PN, hospitalized for DKA in 03/2014, nephrotic sd.). Last visit 3 mo ago.  Since last visit, he was hospitalized at [facility] for 19 days (this month) for stage nephrotic sd. He has a h/o horseshoe kidney.  Insulin regimen was changed in the hospital. He was also taken off Metformin. Sugars high in house (190-332) as he was not given enough mealtime insulin >> he now switched back to previous regimen >> sugars decreasing.  Last hemoglobin A1c was: Lab Results  Component Value Date   HGBA1C 8.4 05/26/2014   HGBA1C 10.6* 03/25/2014   HGBA1C 7.1 10/22/2013   Pt is on a regimen of: - Lantus 30 units in am - Humalog 200 - ICR 1:5, target 120, ISF 10 (using size of the meal now: 20-30 units a meal) He was was on Metformin 1000 mg 2x a day with breakfast and dinner - added 05/2014 >> stopped at the last hosp., but advised now he can start back.  Pt checks his sugars 2-6x a day and they improve. Reviewed sugars from last visit and from hospitalization.  - am: 147-210 - 2h after b'fast: 250, 266 - before lunch: 138, 176-292 - 2h after lunch: 83-330 - before dinner: 133-234 - 2h after dinner: 84, 113-288 - bedtime: 88-199 - nighttime: n/c No lows. Lowest sugar was 80s; he has hypoglycemia awareness at 70.  Highest sugar was 300s.  Glucometer: [employer], physiological.  Pt's meals are: - Breakfast: eggs, sausage, bacon, toast - Lunch: salad - Dinner: meat + veggies - Snacks: meat, cheese rolls  - no CKD, but last BUN/creatinine were high  Lab Results  Component Value Date   BUN 34* 05/26/2014   CREATININE 1.56* 05/26/2014  On Lisinopril (stopped inhouse b/c Cr 2 >> restarted). - last set of lipids: Lab Results  Component Value Date   CHOL 178 09/12/2013   HDL 42  09/12/2013   LDLCALC 98 09/12/2013   TRIG 189* 09/12/2013   CHOLHDL 4.2 09/12/2013  Not on a statin. Simvastatin >> irritability.  - last eye exam was in 04/2014.  Dr Allyne Gee. + DR. He had eye surgery 04/2014. - + numbness and tingling in his feet. On Neurontin.  He has hypothyroidism: - dx in 2015 - on LT4 50 mcg daily: - daily - in am - 30 min before a meal - no PPI, no MVI, no Ca or Fe - on Carafate - at nighttime  Lab Results  Component Value Date   TSH 3.492 04/10/2014   Also has HTN, HL.  ROS: Constitutional: + weight gain, + fatigue, + hot flushes, + poor sleep, + nocturia Eyes: + blurry vision, no xerophthalmia ENT: no sore throat, no nodules palpated in throat, no dysphagia/odynophagia, no hoarseness Cardiovascular: no CP/+ SOB/no palpitations/+ leg swelling Respiratory: no cough/+ SOB Gastrointestinal: no N/V/D/C, + heartburn Musculoskeletal: + muscle aches/+ joint aches Skin: + rash on legs, + itching, + easy bruising Neurological: no tremors/numbness/tingling/dizziness  I reviewed pt's medications, allergies, PMH, social hx, family hx, and changes were documented in the history of present illness. Otherwise, unchanged from my initial visit note:  Past Medical History  Diagnosis Date  . Thyroid disease   . Diabetes mellitus without complication   . Hypertension   . Nephrotic syndrome 05/18/2014  .  DVT (deep venous thrombosis), H/o 01/2014-on Xarelto 03/24/2014  . Secondary DM with DKA-AG=16, BIcarb Nl 03/24/2014   Past Surgical History  Procedure Laterality Date  . Right ankle fx repair    . Eye surgery    . Esophagogastroduodenoscopy N/A 03/29/2014    Procedure: ESOPHAGOGASTRODUODENOSCOPY (EGD);  Surgeon: Dorena Cookey, MD;  Location: [facility];  Service: Endoscopy;  Laterality: N/A;  . Tonsillectomy and adenoidectomy     History   Social History  . Marital Status: Single    Spouse Name: N/A  . Number of Children: no   Occupational History  .       Maintenance Curator   Social History Main Topics  . Smoking status: Never Smoker   . Smokeless tobacco: Not on file  . Alcohol Use: No  . Drug Use: No  . Sexual Activity: Yes   Social History Narrative   Patient currently lives with his fiance   Works with facilities   Nonsmoker, nondrinker   Current Outpatient Prescriptions on File Prior to Visit  Medication Sig Dispense Refill  . ALPRAZolam (XANAX) 0.5 MG tablet TAKE 1 TABLET BY MOUTH TWICE A DAY AS NEEDED FOR SLEEP 60 tablet 0  . citalopram (CELEXA) 20 MG tablet Take 1 tablet (20 mg total) by mouth daily. 30 tablet 3  . cyclobenzaprine (FLEXERIL) 10 MG tablet Take 10 mg by mouth at bedtime.    . gabapentin (NEURONTIN) 600 MG tablet Take 600 mg by mouth 4 (four) times daily.     Marland Kitchen HYDROcodone-acetaminophen (NORCO/VICODIN) 5-325 MG per tablet Take 1 tablet by mouth every 6 (six) hours as needed for moderate pain.    Marland Kitchen ibuprofen (ADVIL,MOTRIN) 800 MG tablet Take 800 mg by mouth 4 (four) times daily as needed.    . insulin glargine (LANTUS) 100 UNIT/ML injection Inject 0.15 mLs (15 Units total) into the skin 2 (two) times daily. 10 mL 5  . levothyroxine (SYNTHROID, LEVOTHROID) 50 MCG tablet Take 1 tablet (50 mcg total) by mouth daily. 90 tablet 3  . lisinopril (PRINIVIL,ZESTRIL) 10 MG tablet TAKE 1 TABLET (10 MG TOTAL) BY MOUTH DAILY) 30 tablet 0  . metoprolol succinate (TOPROL-XL) 100 MG 24 hr tablet Take 1 tablet (100 mg total) by mouth daily. 90 tablet 3  . rivaroxaban (XARELTO) 20 MG TABS tablet Take 1 tablet (20 mg total) by mouth daily with supper. 30 tablet 1  . torsemide (DEMADEX) 20 MG tablet Take 1 tablet (20 mg total) by mouth daily. 90 tablet 3  . traMADol (ULTRAM) 50 MG tablet Take 1 tablet (50 mg total) by mouth every 8 (eight) hours as needed. 30 tablet 0  . HUMALOG KWIKPEN 200 UNIT/ML SOPN INJECT 1-50 UNITS INTO THE SKIN DAILY. 1 UNIT PER 5 GRAMS OF CARBOHYDRATES WITH MEALS/SNACKS PLUS COVERAGE SCALE. TOTAL 50 UNITS/DAY  (Patient not taking: Reported on 07/31/2014) 3 mL 2  . HUMALOG KWIKPEN 200 UNIT/ML SOPN INJECT 1-50 UNITS INTO THE SKIN DAILY. 1 UNIT PER 5 GRAMS OF CARBOHYDRATES WITH MEALS/SNACKS PLUS COVERAGE SCALE. TOTAL 50 UNITS/DAY (Patient not taking: Reported on 07/31/2014) 6 mL 1  . ibuprofen (ADVIL,MOTRIN) 800 MG tablet TAKE 1 TABLET (800 MG TOTAL) BY MOUTH EVERY 8 (EIGHT) HOURS AS NEEDED OFFICE VISIT NEEDED FOR ADDITIONAL REFILLS (Patient not taking: Reported on 07/31/2014) 30 tablet 0   No current facility-administered medications on file prior to visit.   No Known Allergies Family History  Problem Relation Age of Onset  . Obesity Mother     Patient states  that family members have no other medical illnesses other than what I have described  . Heart disease      No family history  . Cancer Father 50    AML   PE: BP 158/90 mmHg  Pulse 84  Temp(Src) 97.5 F (36.4 C) (Oral)  Resp 16  Ht 6\' 2"  (1.88 m)  Wt 272 lb 9.6 oz (123.651 kg)  BMI 34.99 kg/m2  SpO2 96% Wt Readings from Last 3 Encounters:  07/31/14 272 lb 9.6 oz (123.651 kg)  06/08/14 259 lb 14.8 oz (117.9 kg)  06/03/14 262 lb (118.842 kg)   Constitutional: overweight, in NAD Eyes: PERRLA, EOMI, no exophthalmos ENT: moist mucous membranes, no thyromegaly, no cervical lymphadenopathy Cardiovascular: RRR, No MRG, + B pitting edema up to knees Respiratory: CTA B Gastrointestinal: abdomen soft, NT, ND, BS+ Musculoskeletal: no deformities, strength intact in all 4 Skin: moist, warm, no rashes Neurological: no tremor with outstretched hands, DTR normal in all 4  ASSESSMENT: 1. DM2, insulin-dependent, uncontrolled, with complications - DR - PN - nephrotic sd. >> sees nephrology at [facility], will start seeing Dr. Reginal Lutes   2. Hypothyroidism  PLAN:  1. Patient with uncontrolled diabetes, on basal-bolus antidiabetic regimen. He improved DM control before ;last hospitalization >> but had high sugars inhouse 2/2 change in insulin  regimen. He also stopped mteformin >> since GFR improved >> will start this back at 500 mg bid. - I suggested to:  Patient Instructions  Please continue: - Lantus 30 units in am - Humalog U200 - ICR 1:5, target 120, ISF 10 Please inject the rapid acting insulin 15 min before a meal.  Restart Metformin 500 mg 2x a day with breakfast and dinner.  Please return in 3 months with your sugar log.   - Strongly advised him to start checking sugars at different times of the day - check 2 times a day, rotating checks - advised for yearly eye exams >> he is UTD - Return to clinic in 3 mo with sugar log   2. Hypothyroidism - well controlled - takes the thyroid hormone every day, with water, >30 minutes before breakfast, separated by >4 hours from acid reflux medications, calcium, iron, multivitamins. - continue LT4 50 mcg daily - will check TFTs at next visit

## 2014-07-31 NOTE — Telephone Encounter (Signed)
Faxed

## 2014-07-31 NOTE — Patient Instructions (Signed)
Please continue: - Lantus 30 units in am - Humalog U200 - ICR 1:5, target 120, ISF 10 Please inject the rapid acting insulin 15 min before a meal.  Restart Metformin 500 mg 2x a day with breakfast and dinner.  Please return in 3 months with your sugar log.

## 2014-08-03 ENCOUNTER — Other Ambulatory Visit: Payer: Self-pay | Admitting: Family Medicine

## 2014-08-03 DIAGNOSIS — N049 Nephrotic syndrome with unspecified morphologic changes: Secondary | ICD-10-CM

## 2014-08-04 ENCOUNTER — Encounter (HOSPITAL_COMMUNITY): Payer: Self-pay | Admitting: *Deleted

## 2014-08-04 ENCOUNTER — Emergency Department (HOSPITAL_COMMUNITY)
Admission: EM | Admit: 2014-08-04 | Discharge: 2014-08-05 | Disposition: A | Discharge status: Home / Self Care | Payer: Managed Care, Other (non HMO) | Attending: Emergency Medicine | Admitting: Emergency Medicine

## 2014-08-04 DIAGNOSIS — N289 Disorder of kidney and ureter, unspecified: Secondary | ICD-10-CM

## 2014-08-04 DIAGNOSIS — Z79899 Other long term (current) drug therapy: Secondary | ICD-10-CM | POA: Insufficient documentation

## 2014-08-04 DIAGNOSIS — I10 Essential (primary) hypertension: Secondary | ICD-10-CM | POA: Diagnosis not present

## 2014-08-04 DIAGNOSIS — E119 Type 2 diabetes mellitus without complications: Secondary | ICD-10-CM | POA: Diagnosis not present

## 2014-08-04 DIAGNOSIS — Z794 Long term (current) use of insulin: Secondary | ICD-10-CM | POA: Insufficient documentation

## 2014-08-04 DIAGNOSIS — R112 Nausea with vomiting, unspecified: Secondary | ICD-10-CM | POA: Diagnosis present

## 2014-08-04 DIAGNOSIS — E079 Disorder of thyroid, unspecified: Secondary | ICD-10-CM | POA: Insufficient documentation

## 2014-08-04 DIAGNOSIS — Z86718 Personal history of other venous thrombosis and embolism: Secondary | ICD-10-CM | POA: Diagnosis not present

## 2014-08-04 LAB — CBG MONITORING, ED: Glucose-Capillary: 236 mg/dL — ABNORMAL HIGH (ref 65–99)

## 2014-08-04 NOTE — ED Provider Notes (Signed)
CSN: [id number]     Arrival date & time 08/04/14  2255 History  This chart was scribed for Benjiman Core, MD by Octavia Heir, ED Scribe. This patient was seen in room WA07/WA07 and the patient's care was started at 12:00 AM.    Chief Complaint  Patient presents with  . Emesis     The history is provided by the patient. No language interpreter was used.   HPI Comments: Russell [name] is a 44 y.o. male who has a hx of HTN, DM, and nephrotic syndrome kidney disease presents to the Emergency Department complaining of constant, gradual worsening emesis onset 2 days ago. He has associated nausea and headache. He takes Zofran (dissolving) and Phenergan for his nausea but he reports not being able to keep that down due to vomiting. Pt states he has been hospitalized recently for the same symptoms. He was tested for gastroparesis but the result came back negative. Pt denies sick contacts and abdominal surgery.  Past Medical History  Diagnosis Date  . Thyroid disease   . Diabetes mellitus without complication   . Hypertension   . Nephrotic syndrome 05/18/2014  . DVT (deep venous thrombosis), H/o 01/2014-on Xarelto 03/24/2014  . Secondary DM with DKA-AG=16, BIcarb Nl 03/24/2014   Past Surgical History  Procedure Laterality Date  . Right ankle fx repair    . Eye surgery    . Esophagogastroduodenoscopy N/A 03/29/2014    Procedure: ESOPHAGOGASTRODUODENOSCOPY (EGD);  Surgeon: Dorena Cookey, MD;  Location: [facility];  Service: Endoscopy;  Laterality: N/A;  . Tonsillectomy and adenoidectomy     Family History  Problem Relation Age of Onset  . Obesity Mother     Patient states that family members have no other medical illnesses other than what I have described  . Heart disease      No family history  . Cancer Father 4    AML   History  Substance Use Topics  . Smoking status: Never Smoker   . Smokeless tobacco: Never Used  . Alcohol Use: No    Review of Systems  Gastrointestinal:  Positive for nausea and vomiting. Negative for abdominal pain.  All other systems reviewed and are negative.     Allergies  Ibuprofen  Home Medications   Prior to Admission medications   Medication Sig Start Date End Date Taking? Authorizing Provider  acetaminophen (TYLENOL) 500 MG tablet Take 1,000 mg by mouth every 6 (six) hours as needed for moderate pain.   Yes Historical Provider, MD  ALPRAZolam Prudy Feeler) 0.5 MG tablet TAKE 1 TABLET BY MOUTH TWICE A DAY AS NEEDED FOR SLEEP 07/30/14  Yes Wallis Bamberg, PA-C  citalopram (CELEXA) 20 MG tablet Take 1 tablet (20 mg total) by mouth daily. 04/10/14  Yes [name], MD  cyclobenzaprine (FLEXERIL) 10 MG tablet Take 10 mg by mouth at bedtime.   Yes Historical Provider, MD  gabapentin (NEURONTIN) 600 MG tablet Take 600 mg by mouth 4 (four) times daily.  12/19/11  Yes [name], MD  HYDROcodone-acetaminophen (NORCO/VICODIN) 5-325 MG per tablet Take 1 tablet by mouth every 6 (six) hours as needed for moderate pain.   Yes Historical Provider, MD  insulin glargine (LANTUS) 100 UNIT/ML injection Inject 0.15 mLs (15 Units total) into the skin 2 (two) times daily. 04/06/14  Yes Starleen Arms, MD  Insulin Lispro, Human, (HUMALOG KWIKPEN) 200 UNIT/ML SOPN Inject 33 Units into the skin 3 (three) times daily before meals. 07/31/14  Yes Carlus Pavlov, MD  levothyroxine (SYNTHROID,  LEVOTHROID) 50 MCG tablet Take 1 tablet (50 mcg total) by mouth daily. 05/02/13  Yes [name], MD  metoprolol succinate (TOPROL-XL) 100 MG 24 hr tablet Take 1 tablet (100 mg total) by mouth daily. 05/20/14  Yes Lewayne Bunting, MD  torsemide (DEMADEX) 20 MG tablet Take 1 tablet (20 mg total) by mouth daily. 04/22/14  Yes Lewayne Bunting, MD  traMADol (ULTRAM) 50 MG tablet Take 1 tablet (50 mg total) by mouth every 8 (eight) hours as needed. 05/26/14  Yes [name], MD  glucose blood (ACCU-CHEK AVIVA) test strip Use 6-8x a day 07/31/14   Carlus Pavlov, MD   ibuprofen (ADVIL,MOTRIN) 800 MG tablet TAKE 1 TABLET (800 MG TOTAL) BY MOUTH EVERY 8 (EIGHT) HOURS AS NEEDED OFFICE VISIT NEEDED FOR ADDITIONAL REFILLS Patient not taking: Reported on 07/31/2014 06/22/14   [name], MD  lisinopril (PRINIVIL,ZESTRIL) 10 MG tablet TAKE 1 TABLET (10 MG TOTAL) BY MOUTH DAILY) Patient not taking: Reported on 08/04/2014 06/24/14   Porfirio Oar, PA-C  metFORMIN (GLUCOPHAGE) 1000 MG tablet Take 1,000 mg by mouth 2 (two) times daily with a meal.    Historical Provider, MD  promethazine (PHENERGAN) 25 MG tablet Take 1 tablet (25 mg total) by mouth every 6 (six) hours as needed for nausea. 08/05/14   Benjiman Core, MD  rivaroxaban (XARELTO) 20 MG TABS tablet Take 1 tablet (20 mg total) by mouth daily with supper. Patient not taking: Reported on 08/04/2014 03/13/14   [name], MD   Triage vitals: BP 161/112 mmHg  Pulse 112  Temp(Src) 98.5 F (36.9 C) (Oral)  Resp 26  Wt 253 lb (114.76 kg)  SpO2 98%  Physical Exam  Constitutional: He appears well-nourished.  Normalcephalic, well nourished, awake,   HENT:  Head: Normocephalic.  Pulmonary/Chest: Breath sounds normal.  Abdominal: There is tenderness. There is no rebound and no guarding.  minimal abdominal tenderness  Musculoskeletal: He exhibits edema.   mild bilateral pitting lower extremity edema  Nursing note and vitals reviewed.   ED Course  Procedures   12:07 AM Discussed treatment plan which includes lab work, IV fluids with pt at bedside and pt agreed to plan.  Labs Review Labs Reviewed  URINALYSIS, ROUTINE W REFLEX MICROSCOPIC (NOT AT [facility]) - Abnormal; Notable for the following:    APPearance CLOUDY (*)    Glucose, UA 500 (*)    Hgb urine dipstick MODERATE (*)    Protein, ur >300 (*)    All other components within normal limits  COMPREHENSIVE METABOLIC PANEL - Abnormal; Notable for the following:    Glucose, Bld 279 (*)    BUN 22 (*)    Creatinine, Ser 1.48 (*)    Calcium 8.4 (*)     Total Protein 5.8 (*)    Albumin 2.5 (*)    GFR calc non Af Amer 56 (*)    Anion gap 4 (*)    All other components within normal limits  LIPASE, BLOOD - Abnormal; Notable for the following:    Lipase 15 (*)    All other components within normal limits  URINE MICROSCOPIC-ADD ON - Abnormal; Notable for the following:    Casts HYALINE CASTS (*)    All other components within normal limits  CBG MONITORING, ED - Abnormal; Notable for the following:    Glucose-Capillary 236 (*)    All other components within normal limits  CBC    Imaging Review No results found.   EKG Interpretation None  MDM   Final diagnoses:  Non-intractable vomiting with nausea, vomiting of unspecified type  Renal insufficiency    Patient with nausea and vomiting. History of same. Lab work overall reassuring. Creatinine is at what is his apparent new baseline. Feels better after treatment will be discharged home. I personally performed the services described in this documentation, which was scribed in my presence. The recorded information has been reviewed and is accurate.    Benjiman Core, MD 08/05/14 [phone]

## 2014-08-04 NOTE — ED Notes (Signed)
Pt states that he has not fell well in a couple days; pt states that he began vomiting this evening and has vomited several times; pt is also hypertensive and a diabetic; pt states that he has nephrotic syndrome kidney disease

## 2014-08-05 LAB — URINE MICROSCOPIC-ADD ON

## 2014-08-05 LAB — LIPASE, BLOOD: LIPASE: 15 U/L — AB (ref 22–51)

## 2014-08-05 LAB — COMPREHENSIVE METABOLIC PANEL
ALT: 33 U/L (ref 17–63)
AST: 34 U/L (ref 15–41)
Albumin: 2.5 g/dL — ABNORMAL LOW (ref 3.5–5.0)
Alkaline Phosphatase: 70 U/L (ref 38–126)
Anion gap: 4 — ABNORMAL LOW (ref 5–15)
BILIRUBIN TOTAL: 0.7 mg/dL (ref 0.3–1.2)
BUN: 22 mg/dL — ABNORMAL HIGH (ref 6–20)
CO2: 26 mmol/L (ref 22–32)
CREATININE: 1.48 mg/dL — AB (ref 0.61–1.24)
Calcium: 8.4 mg/dL — ABNORMAL LOW (ref 8.9–10.3)
Chloride: 107 mmol/L (ref 101–111)
GFR calc Af Amer: >60 mL/min (ref >60)
GFR, EST NON AFRICAN AMERICAN: 56 mL/min — AB (ref >60)
Glucose, Bld: 279 mg/dL — ABNORMAL HIGH (ref 65–99)
Potassium: 4.1 mmol/L (ref 3.5–5.1)
Sodium: 137 mmol/L (ref 135–145)
Total Protein: 5.8 g/dL — ABNORMAL LOW (ref 6.5–8.1)

## 2014-08-05 LAB — CBC
HCT: 40.1 % (ref 39.0–52.0)
HEMOGLOBIN: 14 g/dL (ref 13.0–17.0)
MCH: 29.6 pg (ref 26.0–34.0)
MCHC: 34.9 g/dL (ref 30.0–36.0)
MCV: 84.8 fL (ref 78.0–100.0)
PLATELETS: 347 10*3/uL (ref 150–400)
RBC: 4.73 MIL/uL (ref 4.22–5.81)
RDW: 12.9 % (ref 11.5–15.5)
WBC: 8.2 10*3/uL (ref 4.0–10.5)

## 2014-08-05 LAB — URINALYSIS, ROUTINE W REFLEX MICROSCOPIC
Bilirubin Urine: NEGATIVE
Glucose, UA: 500 mg/dL — AB
Ketones, ur: NEGATIVE mg/dL
LEUKOCYTES UA: NEGATIVE
Nitrite: NEGATIVE
PH: 6.5 (ref 5.0–8.0)
Specific Gravity, Urine: 1.021 (ref 1.005–1.030)
Urobilinogen, UA: 0.2 mg/dL (ref 0.0–1.0)

## 2014-08-05 MED ORDER — PROMETHAZINE HCL 25 MG PO TABS: 25.0000 mg | ORAL_TABLET | Freq: Four times a day (QID) | ORAL | Status: DC | PRN

## 2014-08-05 MED ORDER — PROMETHAZINE HCL 25 MG/ML IJ SOLN
25.0000 mg | Freq: Once | INTRAMUSCULAR | Status: AC
  Administered 2014-08-05: 25 mg via INTRAVENOUS
  Filled 2014-08-05: qty 1

## 2014-08-05 MED ORDER — SODIUM CHLORIDE 0.9 % IV BOLUS (SEPSIS)
1000.0000 mL | Freq: Once | INTRAVENOUS | Status: AC
  Administered 2014-08-05: 1000 mL via INTRAVENOUS

## 2014-08-05 NOTE — Discharge Instructions (Signed)

## 2014-08-10 ENCOUNTER — Telehealth: Payer: Self-pay

## 2014-08-10 NOTE — Telephone Encounter (Signed)
Patient needs a refill  for gabapentin, flexeril, and xanax. Pharmacy is [facility]

## 2014-08-10 NOTE — Telephone Encounter (Signed)
Dr. Milus Glazier pt.

## 2014-08-11 MED ORDER — CYCLOBENZAPRINE HCL 10 MG PO TABS: 10.0000 mg | ORAL_TABLET | Freq: Every day | ORAL | Status: DC

## 2014-08-11 MED ORDER — ALPRAZOLAM 0.5 MG PO TABS: ORAL_TABLET | ORAL | Status: DC

## 2014-08-11 MED ORDER — GABAPENTIN 600 MG PO TABS: 600.0000 mg | ORAL_TABLET | Freq: Four times a day (QID) | ORAL | Status: DC

## 2014-08-11 NOTE — Telephone Encounter (Signed)
Left message Rx sent

## 2014-08-11 NOTE — Telephone Encounter (Signed)
Rx faxed

## 2014-08-11 NOTE — Telephone Encounter (Signed)
Refills provided. Patient has to pick up Xanax. Thank you.

## 2014-08-21 NOTE — Progress Notes (Signed)
HPI: FU edema. VQ scan April 2016 normal. 24 hour urine for protein April 2016 showed 7500 mg. Patient referred to nephrology for nephrotic syndrome. Echocardiogram May 2016 showed normal LV function. There was moderate left ventricular hypertrophy. Renal Dopplers May 2016 normal. Nuclear study June 2016 showed no ischemia. LV function decreased with ejection fraction 42% but note was made of normal LV function on echo. Since last seen,   Current Outpatient Prescriptions  Medication Sig Dispense Refill  . acetaminophen (TYLENOL) 500 MG tablet Take 1,000 mg by mouth every 6 (six) hours as needed for moderate pain.    Marland Kitchen ALPRAZolam (XANAX) 0.5 MG tablet TAKE 1 TABLET BY MOUTH TWICE A DAY AS NEEDED FOR SLEEP 60 tablet 0  . citalopram (CELEXA) 20 MG tablet Take 1 tablet (20 mg total) by mouth daily. 30 tablet 3  . cyclobenzaprine (FLEXERIL) 10 MG tablet Take 1 tablet (10 mg total) by mouth at bedtime. 30 tablet 6  . gabapentin (NEURONTIN) 600 MG tablet Take 1 tablet (600 mg total) by mouth 4 (four) times daily. 120 tablet 6  . glucose blood (ACCU-CHEK AVIVA) test strip Use 6-8x a day 600 each 3  . HYDROcodone-acetaminophen (NORCO/VICODIN) 5-325 MG per tablet Take 1 tablet by mouth every 6 (six) hours as needed for moderate pain.    Marland Kitchen ibuprofen (ADVIL,MOTRIN) 800 MG tablet TAKE 1 TABLET (800 MG TOTAL) BY MOUTH EVERY 8 (EIGHT) HOURS AS NEEDED OFFICE VISIT NEEDED FOR ADDITIONAL REFILLS (Patient not taking: Reported on 07/31/2014) 30 tablet 0  . insulin glargine (LANTUS) 100 UNIT/ML injection Inject 0.15 mLs (15 Units total) into the skin 2 (two) times daily. 10 mL 5  . Insulin Lispro, Human, (HUMALOG KWIKPEN) 200 UNIT/ML SOPN Inject 33 Units into the skin 3 (three) times daily before meals. 90 mL 1  . levothyroxine (SYNTHROID, LEVOTHROID) 50 MCG tablet Take 1 tablet (50 mcg total) by mouth daily. 90 tablet 3  . lisinopril (PRINIVIL,ZESTRIL) 10 MG tablet TAKE 1 TABLET (10 MG TOTAL) BY MOUTH DAILY)  (Patient not taking: Reported on 08/04/2014) 30 tablet 0  . metFORMIN (GLUCOPHAGE) 1000 MG tablet Take 1,000 mg by mouth 2 (two) times daily with a meal.    . metoprolol succinate (TOPROL-XL) 100 MG 24 hr tablet Take 1 tablet (100 mg total) by mouth daily. 90 tablet 3  . promethazine (PHENERGAN) 25 MG tablet Take 1 tablet (25 mg total) by mouth every 6 (six) hours as needed for nausea. 20 tablet 0  . rivaroxaban (XARELTO) 20 MG TABS tablet Take 1 tablet (20 mg total) by mouth daily with supper. (Patient not taking: Reported on 08/04/2014) 30 tablet 1  . torsemide (DEMADEX) 20 MG tablet Take 1 tablet (20 mg total) by mouth daily. 90 tablet 3  . traMADol (ULTRAM) 50 MG tablet Take 1 tablet (50 mg total) by mouth every 8 (eight) hours as needed. 30 tablet 0   No current facility-administered medications for this visit.     Past Medical History  Diagnosis Date  . Thyroid disease   . Diabetes mellitus without complication   . Hypertension   . Nephrotic syndrome 05/18/2014  . DVT (deep venous thrombosis), H/o 01/2014-on Xarelto 03/24/2014  . Secondary DM with DKA-AG=16, BIcarb Nl 03/24/2014    Past Surgical History  Procedure Laterality Date  . Right ankle fx repair    . Eye surgery    . Esophagogastroduodenoscopy N/A 03/29/2014    Procedure: ESOPHAGOGASTRODUODENOSCOPY (EGD);  Surgeon: Dorena Cookey, MD;  Location: [facility];  Service: Endoscopy;  Laterality: N/A;  . Tonsillectomy and adenoidectomy      Social History   Social History  . Marital Status: Single    Spouse Name: N/A  . Number of Children: N/A  . Years of Education: N/A   Occupational History  .      Maintenance Curator   Social History Main Topics  . Smoking status: Never Smoker   . Smokeless tobacco: Never Used  . Alcohol Use: No  . Drug Use: No  . Sexual Activity: Yes   Other Topics Concern  . Not on file   Social History Narrative   Patient currently lives with his fiance   Works with facilities    Nonsmoker, nondrinker       ROS: no fevers or chills, productive cough, hemoptysis, dysphasia, odynophagia, melena, hematochezia, dysuria, hematuria, rash, seizure activity, orthopnea, PND, pedal edema, claudication. Remaining systems are negative.  Physical Exam: Well-developed well-nourished in no acute distress.  Skin is warm and dry.  HEENT is normal.  Neck is supple.  Chest is clear to auscultation with normal expansion.  Cardiovascular exam is regular rate and rhythm.  Abdominal exam nontender or distended. No masses palpated. Extremities show no edema. neuro grossly intact  ECG     This encounter was created in error - please disregard.

## 2014-08-26 ENCOUNTER — Encounter: Payer: Managed Care, Other (non HMO) | Admitting: Cardiology

## 2014-08-27 ENCOUNTER — Telehealth: Payer: Self-pay | Admitting: Cardiology

## 2014-08-27 NOTE — Telephone Encounter (Signed)
Received records from [facility] for appointment on 10/16/14 with Dr Jens Som.  Records given to [facility] (medical records) for Dr Ludwig Clarks schedule on 10/16/14. lp

## 2014-09-02 ENCOUNTER — Other Ambulatory Visit: Payer: Self-pay | Admitting: *Deleted

## 2014-09-02 DIAGNOSIS — F4323 Adjustment disorder with mixed anxiety and depressed mood: Secondary | ICD-10-CM

## 2014-09-02 MED ORDER — CITALOPRAM HYDROBROMIDE 20 MG PO TABS: 20.0000 mg | ORAL_TABLET | Freq: Every day | ORAL | Status: DC

## 2014-09-03 ENCOUNTER — Ambulatory Visit (INDEPENDENT_AMBULATORY_CARE_PROVIDER_SITE_OTHER): Payer: Managed Care, Other (non HMO) | Admitting: Family Medicine

## 2014-09-03 VITALS — BP 160/106 | HR 88 | Temp 98.4°F | Resp 16 | Ht 74.0 in | Wt 276.0 lb

## 2014-09-03 DIAGNOSIS — R600 Localized edema: Secondary | ICD-10-CM | POA: Diagnosis not present

## 2014-09-03 DIAGNOSIS — R110 Nausea: Secondary | ICD-10-CM | POA: Diagnosis not present

## 2014-09-03 DIAGNOSIS — E1140 Type 2 diabetes mellitus with diabetic neuropathy, unspecified: Secondary | ICD-10-CM

## 2014-09-03 DIAGNOSIS — M25569 Pain in unspecified knee: Secondary | ICD-10-CM

## 2014-09-03 DIAGNOSIS — J4530 Mild persistent asthma, uncomplicated: Secondary | ICD-10-CM | POA: Diagnosis not present

## 2014-09-03 DIAGNOSIS — Z23 Encounter for immunization: Secondary | ICD-10-CM

## 2014-09-03 DIAGNOSIS — E08311 Diabetes mellitus due to underlying condition with unspecified diabetic retinopathy with macular edema: Secondary | ICD-10-CM | POA: Diagnosis not present

## 2014-09-03 DIAGNOSIS — N049 Nephrotic syndrome with unspecified morphologic changes: Secondary | ICD-10-CM

## 2014-09-03 DIAGNOSIS — I10 Essential (primary) hypertension: Secondary | ICD-10-CM

## 2014-09-03 DIAGNOSIS — Z0000 Encounter for general adult medical examination without abnormal findings: Secondary | ICD-10-CM

## 2014-09-03 MED ORDER — HYDROCODONE-ACETAMINOPHEN 5-325 MG PO TABS: 1.0000 | ORAL_TABLET | Freq: Four times a day (QID) | ORAL | Status: DC | PRN

## 2014-09-03 MED ORDER — PROMETHAZINE HCL 25 MG PO TABS: 25.0000 mg | ORAL_TABLET | Freq: Four times a day (QID) | ORAL | Status: DC | PRN

## 2014-09-03 MED ORDER — IPRATROPIUM-ALBUTEROL 18-103 MCG/ACT IN AERO: 2.0000 | INHALATION_SPRAY | RESPIRATORY_TRACT | Status: DC

## 2014-09-03 MED ORDER — ALBUTEROL SULFATE (2.5 MG/3ML) 0.083% IN NEBU: 2.5000 mg | INHALATION_SOLUTION | Freq: Four times a day (QID) | RESPIRATORY_TRACT | Status: DC | PRN

## 2014-09-03 NOTE — Progress Notes (Signed)
This chart was scribed for [name], MD by [name] medical scribe at [facility].The patient was seen in exam room 1 and the patient's care was started at 6:40 PM.  Patient ID: Russell [name] MRN: [id number], DOB: 10-14-1970, 44 y.o. Date of Encounter: 09/03/2014  Primary Physician: [name], MD  Chief Complaint:  Chief Complaint  Patient presents with  . Nephrotic Syndrome    f\u    HPI:  Russell [name] is a 44 y.o. male who presents to [facility] for a follow up regarding his Stage III Nephrotic syndrome. Pt reports that he has not been doing well. Pt states that he has visited [facility] medical center, and stayed there for 2 week. He indicates that they told him that he needed a biopsy done, therefore he had a surgery done. Afterwards, he was told the samples that were taken were too deep into the tissue, and are not from the required tissue. He was told that the risk to have the surgery was too high and is not recommended. Pt was also taken off metformin and Lisinopril, therefore his blood pressure and diabetes has not been controlled. He indicates that the hospital bill was 90K. Pt's new nephrologist advised him to go back on the medications to control his stats. Additionally, pt reports that he had recently visited the ED for emesis, and was advised that it was possibly due to his uncontrolled blood pressure. He also complains of headaches, and lower extremity edema as a result of these measurements. He indicates that now his blood pressure is more managed, and his blood sugar has been running in good ranges, it was 135 this morning. Pt reports that walking or doing activities cause him much pain and fatigue. He notes that his pain levels have not became any milder. He states that he is still on long term disability, however it expires in a year. He states that her presents of wheezing at night time, and reports that using a nebulizer  given at the hospital has given him much relief.   Pt gets a lia treatment for his eyes and he finds much relief with it. He is scheduled to get this treatment in 2 weeks.  Pt is not a smoker.  Pt's last creatinine measurements was 1.5. He indicates that he is trying to watch his diet.  Pt reports that he no longer takes Ibuprofen. He is interested in getting a flu shot.    Past Medical History  Diagnosis Date  . Thyroid disease   . Diabetes mellitus without complication   . Hypertension   . Nephrotic syndrome 05/18/2014  . DVT (deep venous thrombosis), H/o 01/2014-on Xarelto 03/24/2014  . Secondary DM with DKA-AG=16, BIcarb Nl 03/24/2014     Home Meds: Prior to Admission medications   Medication Sig Start Date End Date Taking? Authorizing Provider  acetaminophen (TYLENOL) 500 MG tablet Take 1,000 mg by mouth every 6 (six) hours as needed for moderate pain.   Yes Historical Provider, MD  albuterol-ipratropium (COMBIVENT) 18-103 MCG/ACT inhaler Inhale 2 puffs into the lungs every 4 (four) hours.   Yes Historical Provider, MD  ALPRAZolam Prudy Feeler) 0.5 MG tablet TAKE 1 TABLET BY MOUTH TWICE A DAY AS NEEDED FOR SLEEP 08/11/14  Yes Wallis Bamberg, PA-C  citalopram (CELEXA) 20 MG tablet Take 1 tablet (20 mg total) by mouth daily. TAKE 1 TABLET (20 MG TOTAL) BY MOUTH DAILY    "OV NEEDED FOR REFILLS"  09/02/14  Yes Chelle Jeffery, PA-C  cyclobenzaprine (FLEXERIL) 10 MG tablet Take 1 tablet (10 mg total) by mouth at bedtime. 08/11/14  Yes Wallis Bamberg, PA-C  gabapentin (NEURONTIN) 600 MG tablet Take 1 tablet (600 mg total) by mouth 4 (four) times daily. 08/11/14  Yes Wallis Bamberg, PA-C  glucose blood (ACCU-CHEK AVIVA) test strip Use 6-8x a day 07/31/14  Yes Carlus Pavlov, MD  HYDROcodone-acetaminophen (NORCO/VICODIN) 5-325 MG per tablet Take 1 tablet by mouth every 6 (six) hours as needed for moderate pain.   Yes Historical Provider, MD  insulin glargine (LANTUS) 100 UNIT/ML injection Inject 0.15 mLs (15 Units  total) into the skin 2 (two) times daily. 04/06/14  Yes Starleen Arms, MD  Insulin Lispro, Human, (HUMALOG KWIKPEN) 200 UNIT/ML SOPN Inject 33 Units into the skin 3 (three) times daily before meals. 07/31/14  Yes Carlus Pavlov, MD  levothyroxine (SYNTHROID, LEVOTHROID) 50 MCG tablet Take 1 tablet (50 mcg total) by mouth daily. 05/02/13  Yes [name], MD  lisinopril (PRINIVIL,ZESTRIL) 10 MG tablet TAKE 1 TABLET (10 MG TOTAL) BY MOUTH DAILY) 06/24/14  Yes Chelle Jeffery, PA-C  metFORMIN (GLUCOPHAGE) 1000 MG tablet Take 1,000 mg by mouth 2 (two) times daily with a meal.   Yes Historical Provider, MD  metoprolol succinate (TOPROL-XL) 100 MG 24 hr tablet Take 1 tablet (100 mg total) by mouth daily. 05/20/14  Yes Lewayne Bunting, MD  promethazine (PHENERGAN) 25 MG tablet Take 1 tablet (25 mg total) by mouth every 6 (six) hours as needed for nausea. 08/05/14  Yes Benjiman Core, MD  rivaroxaban (XARELTO) 20 MG TABS tablet Take 1 tablet (20 mg total) by mouth daily with supper. 03/13/14  Yes [name], MD  torsemide (DEMADEX) 20 MG tablet Take 1 tablet (20 mg total) by mouth daily. 04/22/14  Yes Lewayne Bunting, MD  traMADol (ULTRAM) 50 MG tablet Take 1 tablet (50 mg total) by mouth every 8 (eight) hours as needed. 05/26/14  Yes [name], MD    Allergies:  Allergies  Allergen Reactions  . Ibuprofen Other (See Comments)    MD told him not to take due to kidney disease.    Social History   Social History  . Marital Status: Single    Spouse Name: N/A  . Number of Children: N/A  . Years of Education: N/A   Occupational History  .      Maintenance Curator   Social History Main Topics  . Smoking status: Never Smoker   . Smokeless tobacco: Never Used  . Alcohol Use: No  . Drug Use: No  . Sexual Activity: Yes   Other Topics Concern  . Not on file   Social History Narrative   Patient currently lives with his fiance   Works with facilities   Nonsmoker, nondrinker          Review of Systems: Constitutional: Positive for Fatigue. Negative for chills, fever, night sweats, weight changes.  HEENT: negative for vision changes, hearing loss, congestion, rhinorrhea, ST, epistaxis, or sinus pressure Cardiovascular: negative for chest pain or palpitations Respiratory: Positive for wheezing. Negative for hemoptysis, shortness of breath, or cough Abdominal: Positive for vomiting. Negative for abdominal pain, nausea, diarrhea, or constipation Dermatological: negative for rash Neurologic: Positive for headache. Negative for dizziness, or syncope Positive for lower extremity edema.  All other systems reviewed and are otherwise negative with the exception to those above and in the HPI.  Physical Exam: Blood pressure 160/106, pulse 88, temperature 98.4 F (  36.9 C), temperature source Oral, resp. rate 16, height 6\' 2"  (1.88 m), weight 276 lb (125.193 kg), SpO2 97 %., Body mass index is 35.42 kg/(m^2). General: Well developed, well nourished, in no acute distress. Head: Normocephalic, atraumatic, eyes without discharge, sclera non-icteric, nares are without discharge. Bilateral auditory canals clear, TM's are without perforation, pearly grey and translucent with reflective cone of light bilaterally. Oral cavity moist, posterior pharynx without exudate, erythema, peritonsillar abscess, or post nasal drip.  Neck: Supple. No thyromegaly. Full ROM. No lymphadenopathy. Lungs: Clear bilaterally to auscultation without wheezes, rales, or rhonchi. Breathing is unlabored. Heart: RRR with S1 S2. No murmurs, rubs, or gallops appreciated. Abdomen: Soft, non-tender, non-distended with normoactive bowel sounds. No hepatomegaly. No rebound/guarding. No obvious abdominal masses. Msk:  Strength and tone normal for age. Extremities/Skin: Warm and dry. No clubbing or cyanosis. No edema. No rashes or suspicious lesions. Neuro: Alert and oriented X 3. Moves all extremities spontaneously. Gait  is normal. CNII-XII grossly in tact. Psych:  Responds to questions appropriately with a normal affect.   Labs:  ASSESSMENT AND PLAN:  44 y.o. year old male with nephrotic syndrome, uncontrolled hypertension, type 2 diabetes, chronic lower extremity edema with pain, diabetic retinopathy.  There is no way this man can work.  Signed, [name], MD 09/03/2014 6:40 PM

## 2014-09-17 NOTE — Telephone Encounter (Signed)
This encounter was created in error - please disregard.

## 2014-09-21 ENCOUNTER — Inpatient Hospital Stay (HOSPITAL_COMMUNITY)
Admission: EM | Admit: 2014-09-21 | Discharge: 2014-09-29 | DRG: 683 | Disposition: A | Discharge status: Home / Self Care | Payer: Managed Care, Other (non HMO) | Attending: Internal Medicine | Admitting: Internal Medicine

## 2014-09-21 ENCOUNTER — Observation Stay (HOSPITAL_COMMUNITY): Payer: Managed Care, Other (non HMO)

## 2014-09-21 ENCOUNTER — Encounter (HOSPITAL_COMMUNITY): Payer: Self-pay

## 2014-09-21 ENCOUNTER — Emergency Department (HOSPITAL_COMMUNITY): Payer: Managed Care, Other (non HMO)

## 2014-09-21 DIAGNOSIS — E1159 Type 2 diabetes mellitus with other circulatory complications: Secondary | ICD-10-CM

## 2014-09-21 DIAGNOSIS — N179 Acute kidney failure, unspecified: Secondary | ICD-10-CM | POA: Diagnosis present

## 2014-09-21 DIAGNOSIS — Z7901 Long term (current) use of anticoagulants: Secondary | ICD-10-CM

## 2014-09-21 DIAGNOSIS — I160 Hypertensive urgency: Secondary | ICD-10-CM | POA: Diagnosis present

## 2014-09-21 DIAGNOSIS — R1110 Vomiting, unspecified: Secondary | ICD-10-CM | POA: Diagnosis not present

## 2014-09-21 DIAGNOSIS — Z6836 Body mass index (BMI) 36.0-36.9, adult: Secondary | ICD-10-CM

## 2014-09-21 DIAGNOSIS — Z86718 Personal history of other venous thrombosis and embolism: Secondary | ICD-10-CM

## 2014-09-21 DIAGNOSIS — I739 Peripheral vascular disease, unspecified: Secondary | ICD-10-CM

## 2014-09-21 DIAGNOSIS — E0841 Diabetes mellitus due to underlying condition with diabetic mononeuropathy: Secondary | ICD-10-CM | POA: Diagnosis not present

## 2014-09-21 DIAGNOSIS — R112 Nausea with vomiting, unspecified: Secondary | ICD-10-CM | POA: Diagnosis present

## 2014-09-21 DIAGNOSIS — I129 Hypertensive chronic kidney disease with stage 1 through stage 4 chronic kidney disease, or unspecified chronic kidney disease: Secondary | ICD-10-CM | POA: Diagnosis present

## 2014-09-21 DIAGNOSIS — Z79891 Long term (current) use of opiate analgesic: Secondary | ICD-10-CM

## 2014-09-21 DIAGNOSIS — G43A1 Cyclical vomiting, intractable: Secondary | ICD-10-CM

## 2014-09-21 DIAGNOSIS — N19 Unspecified kidney failure: Secondary | ICD-10-CM

## 2014-09-21 DIAGNOSIS — I10 Essential (primary) hypertension: Secondary | ICD-10-CM

## 2014-09-21 DIAGNOSIS — N189 Chronic kidney disease, unspecified: Secondary | ICD-10-CM

## 2014-09-21 DIAGNOSIS — Z79899 Other long term (current) drug therapy: Secondary | ICD-10-CM

## 2014-09-21 DIAGNOSIS — E11319 Type 2 diabetes mellitus with unspecified diabetic retinopathy without macular edema: Secondary | ICD-10-CM | POA: Diagnosis present

## 2014-09-21 DIAGNOSIS — E039 Hypothyroidism, unspecified: Secondary | ICD-10-CM | POA: Diagnosis present

## 2014-09-21 DIAGNOSIS — R34 Anuria and oliguria: Secondary | ICD-10-CM | POA: Diagnosis not present

## 2014-09-21 DIAGNOSIS — E1165 Type 2 diabetes mellitus with hyperglycemia: Secondary | ICD-10-CM | POA: Diagnosis present

## 2014-09-21 DIAGNOSIS — N183 Chronic kidney disease, stage 3 (moderate): Secondary | ICD-10-CM | POA: Diagnosis present

## 2014-09-21 DIAGNOSIS — N178 Other acute kidney failure: Secondary | ICD-10-CM | POA: Diagnosis not present

## 2014-09-21 DIAGNOSIS — R140 Abdominal distension (gaseous): Secondary | ICD-10-CM

## 2014-09-21 DIAGNOSIS — E1121 Type 2 diabetes mellitus with diabetic nephropathy: Secondary | ICD-10-CM | POA: Diagnosis present

## 2014-09-21 DIAGNOSIS — E1122 Type 2 diabetes mellitus with diabetic chronic kidney disease: Secondary | ICD-10-CM | POA: Diagnosis present

## 2014-09-21 DIAGNOSIS — E1140 Type 2 diabetes mellitus with diabetic neuropathy, unspecified: Secondary | ICD-10-CM | POA: Diagnosis present

## 2014-09-21 DIAGNOSIS — IMO0002 Reserved for concepts with insufficient information to code with codable children: Secondary | ICD-10-CM | POA: Diagnosis present

## 2014-09-21 DIAGNOSIS — Z794 Long term (current) use of insulin: Secondary | ICD-10-CM

## 2014-09-21 DIAGNOSIS — E871 Hypo-osmolality and hyponatremia: Secondary | ICD-10-CM | POA: Diagnosis present

## 2014-09-21 DIAGNOSIS — E1151 Type 2 diabetes mellitus with diabetic peripheral angiopathy without gangrene: Secondary | ICD-10-CM | POA: Diagnosis present

## 2014-09-21 DIAGNOSIS — N170 Acute kidney failure with tubular necrosis: Secondary | ICD-10-CM | POA: Diagnosis not present

## 2014-09-21 DIAGNOSIS — R0602 Shortness of breath: Secondary | ICD-10-CM

## 2014-09-21 DIAGNOSIS — R062 Wheezing: Secondary | ICD-10-CM

## 2014-09-21 DIAGNOSIS — I809 Phlebitis and thrombophlebitis of unspecified site: Secondary | ICD-10-CM | POA: Diagnosis present

## 2014-09-21 DIAGNOSIS — N049 Nephrotic syndrome with unspecified morphologic changes: Secondary | ICD-10-CM | POA: Diagnosis present

## 2014-09-21 DIAGNOSIS — Z886 Allergy status to analgesic agent status: Secondary | ICD-10-CM

## 2014-09-21 DIAGNOSIS — R109 Unspecified abdominal pain: Secondary | ICD-10-CM

## 2014-09-21 DIAGNOSIS — D6859 Other primary thrombophilia: Secondary | ICD-10-CM | POA: Diagnosis present

## 2014-09-21 LAB — COMPREHENSIVE METABOLIC PANEL
ALT: 29 U/L (ref 17–63)
AST: 33 U/L (ref 15–41)
Albumin: 2.7 g/dL — ABNORMAL LOW (ref 3.5–5.0)
Alkaline Phosphatase: 85 U/L (ref 38–126)
Anion gap: 8 (ref 5–15)
BILIRUBIN TOTAL: 0.8 mg/dL (ref 0.3–1.2)
BUN: 33 mg/dL — ABNORMAL HIGH (ref 6–20)
CO2: 26 mmol/L (ref 22–32)
CREATININE: 2.15 mg/dL — AB (ref 0.61–1.24)
Calcium: 8.8 mg/dL — ABNORMAL LOW (ref 8.9–10.3)
Chloride: 103 mmol/L (ref 101–111)
GFR calc Af Amer: 42 mL/min — ABNORMAL LOW (ref >60)
GFR, EST NON AFRICAN AMERICAN: 36 mL/min — AB (ref >60)
Glucose, Bld: 313 mg/dL — ABNORMAL HIGH (ref 65–99)
POTASSIUM: 4 mmol/L (ref 3.5–5.1)
Sodium: 137 mmol/L (ref 135–145)
TOTAL PROTEIN: 6.2 g/dL — AB (ref 6.5–8.1)

## 2014-09-21 LAB — RAPID URINE DRUG SCREEN, HOSP PERFORMED
AMPHETAMINES: NOT DETECTED
Barbiturates: NOT DETECTED
Benzodiazepines: NOT DETECTED
Cocaine: NOT DETECTED
OPIATES: NOT DETECTED
TETRAHYDROCANNABINOL: NOT DETECTED

## 2014-09-21 LAB — URINALYSIS, ROUTINE W REFLEX MICROSCOPIC
BILIRUBIN URINE: NEGATIVE
Glucose, UA: >1000 mg/dL — AB
Ketones, ur: NEGATIVE mg/dL
Leukocytes, UA: NEGATIVE
NITRITE: NEGATIVE
SPECIFIC GRAVITY, URINE: 1.025 (ref 1.005–1.030)
UROBILINOGEN UA: 0.2 mg/dL (ref 0.0–1.0)
pH: 6.5 (ref 5.0–8.0)

## 2014-09-21 LAB — CBC
HCT: 45.4 % (ref 39.0–52.0)
Hemoglobin: 15.7 g/dL (ref 13.0–17.0)
MCH: 29.1 pg (ref 26.0–34.0)
MCHC: 34.6 g/dL (ref 30.0–36.0)
MCV: 84.2 fL (ref 78.0–100.0)
PLATELETS: 293 10*3/uL (ref 150–400)
RBC: 5.39 MIL/uL (ref 4.22–5.81)
RDW: 12.9 % (ref 11.5–15.5)
WBC: 9.2 10*3/uL (ref 4.0–10.5)

## 2014-09-21 LAB — URINE MICROSCOPIC-ADD ON

## 2014-09-21 LAB — I-STAT TROPONIN, ED: Troponin i, poc: 0 ng/mL (ref 0.00–0.08)

## 2014-09-21 LAB — CBG MONITORING, ED
Glucose-Capillary: 300 mg/dL — ABNORMAL HIGH (ref 65–99)
Glucose-Capillary: 301 mg/dL — ABNORMAL HIGH (ref 65–99)

## 2014-09-21 LAB — MRSA PCR SCREENING: MRSA by PCR: NEGATIVE

## 2014-09-21 LAB — LIPASE, BLOOD: Lipase: 17 U/L — ABNORMAL LOW (ref 22–51)

## 2014-09-21 LAB — GLUCOSE, CAPILLARY: GLUCOSE-CAPILLARY: 277 mg/dL — AB (ref 65–99)

## 2014-09-21 MED ORDER — IOHEXOL 300 MG/ML  SOLN
25.0000 mL | Freq: Once | INTRAMUSCULAR | Status: AC | PRN
  Administered 2014-09-21: 25 mL via ORAL

## 2014-09-21 MED ORDER — LEVOTHYROXINE SODIUM 50 MCG PO TABS
50.0000 ug | ORAL_TABLET | Freq: Every day | ORAL | Status: DC
  Administered 2014-09-22 – 2014-09-24 (×3): 50 ug via ORAL
  Filled 2014-09-21 (×3): qty 1

## 2014-09-21 MED ORDER — ONDANSETRON HCL 4 MG/2ML IJ SOLN
4.0000 mg | Freq: Once | INTRAMUSCULAR | Status: AC | PRN
  Administered 2014-09-21: 4 mg via INTRAVENOUS
  Filled 2014-09-21: qty 2

## 2014-09-21 MED ORDER — METOPROLOL TARTRATE 1 MG/ML IV SOLN
5.0000 mg | Freq: Once | INTRAVENOUS | Status: AC
  Administered 2014-09-21: 5 mg via INTRAVENOUS
  Filled 2014-09-21: qty 5

## 2014-09-21 MED ORDER — INSULIN ASPART 100 UNIT/ML ~~LOC~~ SOLN
0.0000 [IU] | Freq: Three times a day (TID) | SUBCUTANEOUS | Status: DC
  Administered 2014-09-21: 7 [IU] via SUBCUTANEOUS
  Filled 2014-09-21: qty 1

## 2014-09-21 MED ORDER — INSULIN ASPART 100 UNIT/ML ~~LOC~~ SOLN
0.0000 [IU] | Freq: Three times a day (TID) | SUBCUTANEOUS | Status: DC
  Administered 2014-09-22: 2 [IU] via SUBCUTANEOUS
  Administered 2014-09-22: 3 [IU] via SUBCUTANEOUS
  Administered 2014-09-22: 2 [IU] via SUBCUTANEOUS
  Administered 2014-09-23: 3 [IU] via SUBCUTANEOUS
  Administered 2014-09-23: 7 [IU] via SUBCUTANEOUS
  Administered 2014-09-23: 1 [IU] via SUBCUTANEOUS
  Administered 2014-09-24 – 2014-09-25 (×4): 3 [IU] via SUBCUTANEOUS
  Administered 2014-09-25: 2 [IU] via SUBCUTANEOUS
  Administered 2014-09-26: 3 [IU] via SUBCUTANEOUS
  Administered 2014-09-26: 2 [IU] via SUBCUTANEOUS
  Administered 2014-09-26: 5 [IU] via SUBCUTANEOUS
  Administered 2014-09-27: 2 [IU] via SUBCUTANEOUS
  Administered 2014-09-28: 1 [IU] via SUBCUTANEOUS
  Administered 2014-09-28: 2 [IU] via SUBCUTANEOUS

## 2014-09-21 MED ORDER — HYDRALAZINE HCL 10 MG PO TABS
10.0000 mg | ORAL_TABLET | Freq: Three times a day (TID) | ORAL | Status: DC
  Administered 2014-09-21: 10 mg via ORAL
  Filled 2014-09-21 (×3): qty 1

## 2014-09-21 MED ORDER — METOPROLOL SUCCINATE ER 100 MG PO TB24
100.0000 mg | ORAL_TABLET | Freq: Every day | ORAL | Status: DC
  Administered 2014-09-21 – 2014-09-23 (×3): 100 mg via ORAL
  Filled 2014-09-21: qty 1
  Filled 2014-09-21 (×2): qty 4

## 2014-09-21 MED ORDER — MORPHINE SULFATE (PF) 4 MG/ML IV SOLN
4.0000 mg | Freq: Once | INTRAVENOUS | Status: AC
  Administered 2014-09-21: 4 mg via INTRAVENOUS
  Filled 2014-09-21: qty 1

## 2014-09-21 MED ORDER — CITALOPRAM HYDROBROMIDE 20 MG PO TABS
20.0000 mg | ORAL_TABLET | Freq: Every day | ORAL | Status: DC
  Administered 2014-09-21 – 2014-09-29 (×9): 20 mg via ORAL
  Filled 2014-09-21 (×10): qty 1

## 2014-09-21 MED ORDER — METOCLOPRAMIDE HCL 5 MG/ML IJ SOLN
10.0000 mg | Freq: Once | INTRAMUSCULAR | Status: AC
  Administered 2014-09-21: 10 mg via INTRAVENOUS
  Filled 2014-09-21: qty 2

## 2014-09-21 MED ORDER — ONDANSETRON HCL 4 MG PO TABS
4.0000 mg | ORAL_TABLET | Freq: Four times a day (QID) | ORAL | Status: DC | PRN
Start: 2014-09-21 — End: 2014-09-29

## 2014-09-21 MED ORDER — ACETAMINOPHEN 500 MG PO TABS
1000.0000 mg | ORAL_TABLET | Freq: Four times a day (QID) | ORAL | Status: DC | PRN
  Administered 2014-09-22: 1000 mg via ORAL
  Filled 2014-09-21: qty 2

## 2014-09-21 MED ORDER — CYCLOBENZAPRINE HCL 10 MG PO TABS
10.0000 mg | ORAL_TABLET | Freq: Every day | ORAL | Status: DC
  Administered 2014-09-21 – 2014-09-28 (×8): 10 mg via ORAL
  Filled 2014-09-21 (×9): qty 1

## 2014-09-21 MED ORDER — ONDANSETRON HCL 4 MG/2ML IJ SOLN
4.0000 mg | Freq: Four times a day (QID) | INTRAMUSCULAR | Status: DC | PRN
  Administered 2014-09-21 – 2014-09-25 (×6): 4 mg via INTRAVENOUS
  Filled 2014-09-21 (×6): qty 2

## 2014-09-21 MED ORDER — ALBUTEROL SULFATE (2.5 MG/3ML) 0.083% IN NEBU
2.5000 mg | INHALATION_SOLUTION | RESPIRATORY_TRACT | Status: DC | PRN
  Administered 2014-09-22 – 2014-09-24 (×7): 2.5 mg via RESPIRATORY_TRACT
  Filled 2014-09-21 (×8): qty 3

## 2014-09-21 MED ORDER — SODIUM CHLORIDE 0.9 % IV SOLN
INTRAVENOUS | Status: DC
  Administered 2014-09-21: 17:00:00 via INTRAVENOUS

## 2014-09-21 MED ORDER — GABAPENTIN 300 MG PO CAPS
600.0000 mg | ORAL_CAPSULE | Freq: Four times a day (QID) | ORAL | Status: DC
Start: 2014-09-21 — End: 2014-09-23
  Administered 2014-09-21 – 2014-09-22 (×5): 600 mg via ORAL
  Filled 2014-09-21 (×12): qty 2

## 2014-09-21 MED ORDER — HYDRALAZINE HCL 20 MG/ML IJ SOLN
5.0000 mg | INTRAMUSCULAR | Status: DC | PRN
  Administered 2014-09-21 – 2014-09-29 (×4): 5 mg via INTRAVENOUS
  Filled 2014-09-21 (×4): qty 1

## 2014-09-21 MED ORDER — INSULIN ASPART 100 UNIT/ML ~~LOC~~ SOLN
0.0000 [IU] | Freq: Every day | SUBCUTANEOUS | Status: DC
  Administered 2014-09-21: 3 [IU] via SUBCUTANEOUS
  Administered 2014-09-25 – 2014-09-28 (×2): 2 [IU] via SUBCUTANEOUS

## 2014-09-21 MED ORDER — HYDROMORPHONE HCL 1 MG/ML IJ SOLN
1.0000 mg | INTRAMUSCULAR | Status: DC | PRN
  Administered 2014-09-21 – 2014-09-25 (×29): 1 mg via INTRAVENOUS
  Filled 2014-09-21 (×29): qty 1

## 2014-09-21 MED ORDER — PROMETHAZINE HCL 25 MG/ML IJ SOLN
25.0000 mg | Freq: Once | INTRAMUSCULAR | Status: AC
  Administered 2014-09-21: 25 mg via INTRAVENOUS
  Filled 2014-09-21: qty 1

## 2014-09-21 MED ORDER — ALPRAZOLAM 0.5 MG PO TABS
0.5000 mg | ORAL_TABLET | Freq: Three times a day (TID) | ORAL | Status: DC | PRN
  Administered 2014-09-22 – 2014-09-27 (×3): 0.5 mg via ORAL
  Filled 2014-09-21 (×3): qty 1

## 2014-09-21 MED ORDER — HYDROCODONE-ACETAMINOPHEN 5-325 MG PO TABS
1.0000 | ORAL_TABLET | Freq: Four times a day (QID) | ORAL | Status: DC | PRN
  Administered 2014-09-22 – 2014-09-28 (×9): 1 via ORAL
  Filled 2014-09-21 (×11): qty 1

## 2014-09-21 MED ORDER — INSULIN GLARGINE 100 UNIT/ML ~~LOC~~ SOLN
15.0000 [IU] | Freq: Two times a day (BID) | SUBCUTANEOUS | Status: DC
  Administered 2014-09-21 – 2014-09-29 (×16): 15 [IU] via SUBCUTANEOUS
  Filled 2014-09-21 (×19): qty 0.15

## 2014-09-21 MED ORDER — HYDRALAZINE HCL 25 MG PO TABS
25.0000 mg | ORAL_TABLET | Freq: Three times a day (TID) | ORAL | Status: DC
  Administered 2014-09-21 – 2014-09-24 (×8): 25 mg via ORAL
  Filled 2014-09-21 (×9): qty 1

## 2014-09-21 MED ORDER — RIVAROXABAN 20 MG PO TABS
20.0000 mg | ORAL_TABLET | Freq: Every day | ORAL | Status: DC
  Administered 2014-09-21 – 2014-09-22 (×2): 20 mg via ORAL
  Filled 2014-09-21 (×3): qty 1

## 2014-09-21 MED ORDER — PROMETHAZINE HCL 25 MG/ML IJ SOLN
25.0000 mg | Freq: Four times a day (QID) | INTRAMUSCULAR | Status: DC | PRN
Start: 2014-09-21 — End: 2014-09-28
  Administered 2014-09-21 – 2014-09-27 (×8): 25 mg via INTRAVENOUS
  Filled 2014-09-21 (×9): qty 1

## 2014-09-21 NOTE — ED Notes (Signed)
MD at bedside. 

## 2014-09-21 NOTE — ED Notes (Signed)
Per pt, n/v with abdominal pain and headache. CBG was 186 last night.  Has not checked today.  Not keeping meals down today.  Elevated b/p.  Takes bp meds but cannot keep them down.

## 2014-09-21 NOTE — H&P (Addendum)
[facility] History and Physical  Russell [name] ZOX:[id number] DOB: Apr 03, 1970 DOA: 09/21/2014  Referring physician: ED PA, Barrett  PCP: [name], MD   Chief Complaint: nausea and vomiting   HPI:  Pt is 44 yo male with known HTN, HLD, DM type II with complications of neuropathy and retinopathy, obesity presented to [facility] ED with main concern of several days duration of progressive nausea and non bloody vomiting, poor oral intake, unable to take any medications due to his symptoms. He denies fevers, chills, recent sick contacts or exposures, no specific abdominal pain, no urinary concerns. Pt also denies chest pain or shortness of breath.   In ED, pt noted to be uncomfortable due to vomiting. Blood work notable for Cr 2.15 (was WNL 4 months ago). [facility] asked to admit for further evaluation. SDU requested due to BP 215/115.  Assessment and Plan:  Principal Problem:   Intractable nausea and vomiting - unclear etiology at this time - CT abd and pelvis wo contrast pending - provide analgesia as needed Active Problems:   Hypertensive urgency - likely exacerbated by N/V and poor oral intake - stop Lisinopril due to ARF, also stop Torsemide - place on Hydralazine scheduled and as needed - continue home regimen with Metoprolol 100 mg ER QD - if BP remains uncontrolled, consider placing on labetalol  - avoid nitro drip due to headaches    Acute renal failure - appears to be pre renal etiology - hold Lisinopril, Metformin, Torsemide - provide IVF - repeat BMP in AM   Uncontrolled diabetes mellitus type 2 with peripheral artery disease - continue Lantus per home medical regimen - place on SSI until oral intake improves - hold Metformin    Obesity - Body mass index is 33.92 kg/(m^2).   DVT (deep venous thrombosis), H/o 01/2014-on Xarelto - continue AC as per home medical regimen    Uncontrolled diabetes mellitus with retinopathy - outpatient follow up - d/w patient proper  glucose monitoring and control of CBG's   Diabetic neuropathy - continue Gabapentin    Hypothyroidism (acquired) - continue Synthroid    Radiological Exams on Admission: Ct Head Wo Contrast  09/21/2014   CLINICAL DATA:  Complains of gradual onset headache 3 days ago accompanied by multiple episodes of vomiting. He's been unable to hold down any of his medications including his blood pressure medications. No other associated symptoms.  EXAM: CT HEAD WITHOUT CONTRAST  TECHNIQUE: Contiguous axial images were obtained from the base of the skull through the vertex without intravenous contrast.  COMPARISON:  None.  FINDINGS: Ventricles are normal in size and configuration. There are no parenchymal masses or mass effect. There is no evidence of an infarct. No extra-axial masses or abnormal fluid collections.  There is no intracranial hemorrhage.  Visualized sinuses and mastoid air cells are clear. No skull lesion.  IMPRESSION: Normal unenhanced CT scan of the brain.   Electronically Signed   By: Amie Portland M.D.   On: 09/21/2014 14:40     Code Status: Full Family Communication: Pt at bedside Disposition Plan: Admit for further evaluation    Danie Binder [facility] [phone]   Review of Systems:  Constitutional: Negative for fever, chills. Negative for diaphoresis.  HENT: Negative for hearing loss, ear pain, nosebleeds, congestion, sore throat, neck pain, tinnitus and ear discharge.   Eyes: Negative for blurred vision, double vision, photophobia, pain, discharge and redness.  Respiratory: Negative for cough, hemoptysis, sputum production, shortness of breath, wheezing and stridor.   Cardiovascular: Negative  for chest pain, palpitations, orthopnea, claudication and leg swelling.  Gastrointestinal: Negative for heartburn, constipation, blood in stool and melena.  Genitourinary: Negative for dysuria, urgency, frequency, hematuria and flank pain.  Musculoskeletal: Negative for myalgias, back pain, joint  pain and falls.  Skin: Negative for itching and rash.  Neurological: Negative for dizziness and weakness. Endo/Heme/Allergies: Negative for environmental allergies and polydipsia. Does not bruise/bleed easily.  Psychiatric/Behavioral: Negative for suicidal ideas. The patient is not nervous/anxious.      Past Medical History  Diagnosis Date  . Thyroid disease   . Diabetes mellitus without complication   . Hypertension   . Nephrotic syndrome 05/18/2014  . DVT (deep venous thrombosis), H/o 01/2014-on Xarelto 03/24/2014  . Secondary DM with DKA-AG=16, BIcarb Nl 03/24/2014    Past Surgical History  Procedure Laterality Date  . Right ankle fx repair    . Eye surgery    . Esophagogastroduodenoscopy N/A 03/29/2014    Procedure: ESOPHAGOGASTRODUODENOSCOPY (EGD);  Surgeon: Dorena Cookey, MD;  Location: [facility];  Service: Endoscopy;  Laterality: N/A;  . Tonsillectomy and adenoidectomy      Social History:  reports that he has never smoked. He has never used smokeless tobacco. He reports that he does not drink alcohol or use illicit drugs.  Allergies  Allergen Reactions  . Ibuprofen Other (See Comments)    MD told him not to take due to kidney disease.    Family History  Problem Relation Age of Onset  . Obesity Mother     Patient states that family members have no other medical illnesses other than what I have described  . Heart disease      No family history  . Cancer Father 44    AML    Medication Sig  acetaminophen (TYLENOL) 500 MG tablet Take 1,000 mg by mouth every 6 (six) hours as needed for moderate pain.  albuterol (PROVENTIL HFA;VENTOLIN HFA) 108 (90 BASE) MCG/ACT inhaler Inhale 1 puff into the lungs every 6 (six) hours as needed for wheezing or shortness of breath.  albuterol-ipratropium (COMBIVENT) 18-103 MCG/ACT inhaler Inhale 2 puffs into the lungs every 4 (four) hours.  ALPRAZolam (XANAX) 0.5 MG tablet TAKE 1 TABLET BY MOUTH TWICE A DAY AS NEEDED FOR SLEEP  citalopram  (CELEXA) 20 MG tablet Take 1 tablet (20 mg total) by mouth daily. TAKE 1 TABLET (20 MG TOTAL) BY MOUTH DAILY    "OV NEEDED FOR REFILLS"  cyclobenzaprine (FLEXERIL) 10 MG tablet Take 1 tablet (10 mg total) by mouth at bedtime.  gabapentin (NEURONTIN) 600 MG tablet Take 1 tablet (600 mg total) by mouth 4 (four) times daily.  HYDROcodone-acetaminophen (NORCO/VICODIN) 5-325 MG per tablet Take 1 tablet by mouth every 6 (six) hours as needed for moderate pain.  insulin glargine (LANTUS) 100 UNIT/ML injection Inject 0.15 mLs (15 Units total) into the skin 2 (two) times daily. Patient taking differently: Inject 30 Units into the skin daily.   levothyroxine (SYNTHROID, LEVOTHROID) 50 MCG tablet Take 1 tablet (50 mcg total) by mouth daily.  lisinopril (PRINIVIL,ZESTRIL) 10 MG tablet TAKE 1 TABLET (10 MG TOTAL) BY MOUTH DAILY)  metFORMIN (GLUCOPHAGE) 1000 MG tablet Take 1,000 mg by mouth 2 (two) times daily with a meal.  metoprolol succinate (TOPROL-XL) 100 MG 24 hr tablet Take 1 tablet (100 mg total) by mouth daily.  promethazine (PHENERGAN) 25 MG tablet Take 1 tablet (25 mg total) by mouth every 6 (six) hours as needed for nausea.  rivaroxaban (XARELTO) 20 MG TABS tablet Take 1  tablet (20 mg total) by mouth daily with supper.  torsemide (DEMADEX) 20 MG tablet Take 1 tablet (20 mg total) by mouth daily.  traMADol (ULTRAM) 50 MG tablet Take 1 tablet (50 mg total) by mouth every 8 (eight) hours as needed.  albuterol (PROVENTIL) (2.5 MG/3ML) 0.083% nebulizer solution Take 3 mLs (2.5 mg total) by nebulization every 6 (six) hours as needed for wheezing or shortness of breath.  glucose blood (ACCU-CHEK AVIVA) test strip Use 6-8x a day    Physical Exam: Filed Vitals:   09/21/14 1215 09/21/14 1330 09/21/14 1415 09/21/14 1444  BP: 213/113 206/98 206/98 161/90  Pulse: 79 71 81 79  Temp:      TempSrc:      Resp: SpO2: 99% 95% 98% 94%    Physical Exam  Constitutional: Appears well-developed and  well-nourished. No distress.  HENT: Normocephalic. External right and left ear normal. Dry MM Eyes: Conjunctivae and EOM are normal. PERRLA, no scleral icterus.  Neck: Normal ROM. Neck supple. No JVD. No tracheal deviation. No thyromegaly.  CVS: RRR, S1/S2 +, no murmurs, no gallops, no carotid bruit.  Pulmonary: Effort and breath sounds normal, no stridor, rhonchi, wheezes, rales.  Abdominal: Soft. BS +,  no distension, tenderness, rebound or guarding.  Musculoskeletal: Normal range of motion. No edema and no tenderness.  Lymphadenopathy: No lymphadenopathy noted, cervical, inguinal. Neuro: Alert. Normal reflexes, muscle tone coordination. No cranial nerve deficit. Skin: Skin is warm and dry. No rash noted. Not diaphoretic. No erythema. No pallor.  Psychiatric: Normal mood and affect. Behavior, judgment, thought content normal.   Labs on Admission:  Basic Metabolic Panel:  Recent Labs Lab 09/21/14 1035  NA 137  K 4.0  CL 103  CO2 26  GLUCOSE 313*  BUN 33*  CREATININE 2.15*  CALCIUM 8.8*   Liver Function Tests:  Recent Labs Lab 09/21/14 1035  AST 33  ALT 29  ALKPHOS 85  BILITOT 0.8  PROT 6.2*  ALBUMIN 2.7*    Recent Labs Lab 09/21/14 1035  LIPASE 17*   CBC:  Recent Labs Lab 09/21/14 1035  WBC 9.2  HGB 15.7  HCT 45.4  MCV 84.2  PLT 293   CBG:  Recent Labs Lab 09/21/14 1018 09/21/14 1435  GLUCAP 300* 301*    EKG: pending    If 7PM-7AM, please contact night-coverage [email or web address] Password [facility] 09/21/2014, 3:42 PM

## 2014-09-21 NOTE — ED Provider Notes (Signed)
CSN: [id number]     Arrival date & time 09/21/14  [id number] History   First MD Initiated Contact with Patient 09/21/14 1035     Chief Complaint  Patient presents with  . Abdominal Pain  . Emesis   HPI  Mr. Berns is a 44 year old male with PMHx of DM, HTN and nephrotic syndrome presenting with nausea and vomiting. Pt states that he has had gradual worsening of nausea over past 2-3 days with multiple episodes of NBNB vomiting. Nausea has been so severe that pt is unable to keep food, water or medications down. Pt also complaining of generalized, throbbing headache over the past 2 days. Pt takes his blood pressure at home and noted it to be elevated to 225/115 at home. Pt states he has not been able to keep down his blood pressure or diabetes medications over the past few days. Pt has been seen in ED and hospitalized multiple times for emesis and nausea without clear etiology. Denies fevers, chills, dizziness, blurred vision, chest pain, SOB, abdominal pain, diarrhea or dysuria. Endorses chronic leg swelling 2/2 to nephrotic syndrome.   Past Medical History  Diagnosis Date  . Thyroid disease   . Diabetes mellitus without complication   . Hypertension   . Nephrotic syndrome 05/18/2014  . DVT (deep venous thrombosis), H/o 01/2014-on Xarelto 03/24/2014  . Secondary DM with DKA-AG=16, BIcarb Nl 03/24/2014   Past Surgical History  Procedure Laterality Date  . Right ankle fx repair    . Eye surgery    . Esophagogastroduodenoscopy N/A 03/29/2014    Procedure: ESOPHAGOGASTRODUODENOSCOPY (EGD);  Surgeon: Dorena Cookey, MD;  Location: [facility];  Service: Endoscopy;  Laterality: N/A;  . Tonsillectomy and adenoidectomy     Family History  Problem Relation Age of Onset  . Obesity Mother     Patient states that family members have no other medical illnesses other than what I have described  . Heart disease      No family history  . Cancer Father 34    AML   Social History  Substance Use Topics  .  Smoking status: Never Smoker   . Smokeless tobacco: Never Used  . Alcohol Use: No    Review of Systems  Constitutional: Positive for diaphoresis. Negative for fever and chills.  Eyes: Negative for visual disturbance.  Respiratory: Negative for shortness of breath.   Cardiovascular: Positive for leg swelling. Negative for chest pain.  Gastrointestinal: Positive for nausea and vomiting. Negative for abdominal pain, diarrhea and abdominal distention.  Endocrine: Negative for polydipsia and polyuria.  Genitourinary: Negative for dysuria.  Musculoskeletal: Negative for myalgias, back pain and arthralgias.  Skin: Negative for wound.  Neurological: Positive for headaches. Negative for weakness, light-headedness and numbness.      Allergies  Ibuprofen  Home Medications   Prior to Admission medications   Medication Sig Start Date End Date Taking? Authorizing Provider  acetaminophen (TYLENOL) 500 MG tablet Take 1,000 mg by mouth every 6 (six) hours as needed for moderate pain.   Yes Historical Provider, MD  albuterol (PROVENTIL HFA;VENTOLIN HFA) 108 (90 BASE) MCG/ACT inhaler Inhale 1 puff into the lungs every 6 (six) hours as needed for wheezing or shortness of breath.   Yes Historical Provider, MD  albuterol-ipratropium (COMBIVENT) 18-103 MCG/ACT inhaler Inhale 2 puffs into the lungs every 4 (four) hours. 09/03/14  Yes [name], MD  ALPRAZolam Prudy Feeler) 0.5 MG tablet TAKE 1 TABLET BY MOUTH TWICE A DAY AS NEEDED FOR SLEEP 08/11/14  Yes  Wallis Bamberg, PA-C  citalopram (CELEXA) 20 MG tablet Take 1 tablet (20 mg total) by mouth daily. TAKE 1 TABLET (20 MG TOTAL) BY MOUTH DAILY    "OV NEEDED FOR REFILLS" 09/02/14  Yes Chelle Jeffery, PA-C  cyclobenzaprine (FLEXERIL) 10 MG tablet Take 1 tablet (10 mg total) by mouth at bedtime. 08/11/14  Yes Wallis Bamberg, PA-C  gabapentin (NEURONTIN) 600 MG tablet Take 1 tablet (600 mg total) by mouth 4 (four) times daily. 08/11/14  Yes Wallis Bamberg, PA-C   HYDROcodone-acetaminophen (NORCO/VICODIN) 5-325 MG per tablet Take 1 tablet by mouth every 6 (six) hours as needed for moderate pain. 09/03/14  Yes [name], MD  insulin glargine (LANTUS) 100 UNIT/ML injection Inject 0.15 mLs (15 Units total) into the skin 2 (two) times daily. Patient taking differently: Inject 30 Units into the skin daily.  04/06/14  Yes Starleen Arms, MD  Insulin Lispro, Human, (HUMALOG KWIKPEN) 200 UNIT/ML SOPN Inject 33 Units into the skin 3 (three) times daily before meals. Patient taking differently: Inject 15-80 Units into the skin 3 (three) times daily before meals.  07/31/14  Yes Carlus Pavlov, MD  levothyroxine (SYNTHROID, LEVOTHROID) 50 MCG tablet Take 1 tablet (50 mcg total) by mouth daily. 05/02/13  Yes [name], MD  lisinopril (PRINIVIL,ZESTRIL) 10 MG tablet TAKE 1 TABLET (10 MG TOTAL) BY MOUTH DAILY) 06/24/14  Yes Chelle Jeffery, PA-C  metFORMIN (GLUCOPHAGE) 1000 MG tablet Take 1,000 mg by mouth 2 (two) times daily with a meal.   Yes Historical Provider, MD  metoprolol succinate (TOPROL-XL) 100 MG 24 hr tablet Take 1 tablet (100 mg total) by mouth daily. 05/20/14  Yes Lewayne Bunting, MD  promethazine (PHENERGAN) 25 MG tablet Take 1 tablet (25 mg total) by mouth every 6 (six) hours as needed for nausea. 09/03/14  Yes [name], MD  rivaroxaban (XARELTO) 20 MG TABS tablet Take 1 tablet (20 mg total) by mouth daily with supper. 03/13/14  Yes [name], MD  torsemide (DEMADEX) 20 MG tablet Take 1 tablet (20 mg total) by mouth daily. 04/22/14  Yes Lewayne Bunting, MD  traMADol (ULTRAM) 50 MG tablet Take 1 tablet (50 mg total) by mouth every 8 (eight) hours as needed. 05/26/14  Yes [name], MD  albuterol (PROVENTIL) (2.5 MG/3ML) 0.083% nebulizer solution Take 3 mLs (2.5 mg total) by nebulization every 6 (six) hours as needed for wheezing or shortness of breath. 09/03/14   [name], MD  glucose blood (ACCU-CHEK AVIVA) test strip Use 6-8x a  day 07/31/14   Carlus Pavlov, MD   BP 188/90 mmHg  Pulse 90  Temp(Src) 98.3 F (36.8 C) (Oral)  Resp 21  Ht  (1.88 m)  Wt 264 lb 5.3 oz (119.9 kg)  BMI 33.92 kg/m2  SpO2 98% Physical Exam  Constitutional: He is oriented to person, place, and time. He appears well-developed and well-nourished. He appears distressed (Actively vomiting).  HENT:  Head: Normocephalic and atraumatic.  Mouth/Throat: Oropharynx is clear and moist. No oropharyngeal exudate.  Eyes: Conjunctivae and EOM are normal. Pupils are equal, round, and reactive to light. Right eye exhibits no discharge. Left eye exhibits no discharge. No scleral icterus.  Neck: Normal range of motion. Neck supple.  Cardiovascular: Normal rate, regular rhythm and normal heart sounds.   Pulmonary/Chest: Effort normal. No respiratory distress.  Abdominal: Soft. He exhibits no distension. There is tenderness (generalized). There is no rebound and no guarding.  Musculoskeletal: Normal range of motion.  Neurological: He is alert  and oriented to person, place, and time. No cranial nerve deficit. Coordination normal.  Cranial nerves 3-12 intact. 5/5 motor strength intact. Sensation to light touch intact.   Skin: Skin is warm. He is diaphoretic.  Psychiatric: He has a normal mood and affect. His behavior is normal.  Nursing note and vitals reviewed.   ED Course  Procedures (including critical care time) Labs Review Labs Reviewed  LIPASE, BLOOD - Abnormal; Notable for the following:    Lipase 17 (*)    All other components within normal limits  COMPREHENSIVE METABOLIC PANEL - Abnormal; Notable for the following:    Glucose, Bld 313 (*)    BUN 33 (*)    Creatinine, Ser 2.15 (*)    Calcium 8.8 (*)    Total Protein 6.2 (*)    Albumin 2.7 (*)    GFR calc non Af Amer 36 (*)    GFR calc Af Amer 42 (*)    All other components within normal limits  URINALYSIS, ROUTINE W REFLEX MICROSCOPIC (NOT AT [facility]) - Abnormal; Notable for the  following:    Glucose, UA >1000 (*)    Hgb urine dipstick MODERATE (*)    Protein, ur >300 (*)    All other components within normal limits  URINE MICROSCOPIC-ADD ON - Abnormal; Notable for the following:    Bacteria, UA FEW (*)    Casts HYALINE CASTS (*)    Crystals CA OXALATE CRYSTALS (*)    All other components within normal limits  GLUCOSE, CAPILLARY - Abnormal; Notable for the following:    Glucose-Capillary 277 (*)    All other components within normal limits  CBG MONITORING, ED - Abnormal; Notable for the following:    Glucose-Capillary 300 (*)    All other components within normal limits  CBG MONITORING, ED - Abnormal; Notable for the following:    Glucose-Capillary 301 (*)    All other components within normal limits  MRSA PCR SCREENING  CBC  URINE RAPID DRUG SCREEN, HOSP PERFORMED  URINALYSIS, ROUTINE W REFLEX MICROSCOPIC (NOT AT [facility])  TSH  HEMOGLOBIN A1C  BASIC METABOLIC PANEL  CBC  I-STAT TROPOININ, ED    Imaging Review Ct Abdomen Pelvis Wo Contrast  09/21/2014   CLINICAL DATA:  44 year old male with intractable nausea and vomiting for 3 days.  EXAM: CT ABDOMEN AND PELVIS WITHOUT CONTRAST  TECHNIQUE: Multidetector CT imaging of the abdomen and pelvis was performed following the standard protocol without IV contrast.  COMPARISON:  12/12/2013 CT  FINDINGS: Please note that parenchymal abnormalities may be missed without intravenous contrast.  Lower chest:  No acute abnormalities.  Hepatobiliary: The liver and gallbladder are unremarkable. There is no evidence of biliary dilatation.  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 There is mild- moderate perinephric inflammation and small amount of fluid within the air renal spaces extending in the retroperitoneum into the pelvis. There is no evidence of hydronephrosis or urinary calculi. The adrenal glands and bladder are unremarkable.  Stomach/Bowel: There is no  evidence of bowel obstruction or definite bowel wall thickening. The appendix is normal.  Vascular/Lymphatic: No enlarged lymph nodes or abdominal aortic aneurysm.  Reproductive: Prostate within normal limits.  Other: A trace amount of free pelvic fluid is identified. There is no evidence of pneumoperitoneum.  Musculoskeletal: No acute or suspicious abnormalities identified.  IMPRESSION: Mild-moderate inflammation/stranding with small amount of retroperitoneal and free pelvic fluid. This is nonspecific but could be related to renal inflammation  or infection. 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   Electronically Signed   By: Harmon Pier M.D.   On: 09/21/2014 16:31   Ct Head Wo Contrast  09/21/2014   CLINICAL DATA:  Complains of gradual onset headache 3 days ago accompanied by multiple episodes of vomiting. He's been unable to hold down any of his medications including his blood pressure medications. No other associated symptoms.  EXAM: CT HEAD WITHOUT CONTRAST  TECHNIQUE: Contiguous axial images were obtained from the base of the skull through the vertex without intravenous contrast.  COMPARISON:  None.  FINDINGS: Ventricles are normal in size and configuration. There are no parenchymal masses or mass effect. There is no evidence of an infarct. No extra-axial masses or abnormal fluid collections.  There is no intracranial hemorrhage.  Visualized sinuses and mastoid air cells are clear. No skull lesion.  IMPRESSION: Normal unenhanced CT scan of the brain.   Electronically Signed   By: Amie Portland M.D.   On: 09/21/2014 14:40   I have personally reviewed and evaluated these images and lab results as part of my medical decision-making.   EKG Interpretation   Date/Time:  Monday September 21 2014 13:05:51 EDT Ventricular Rate:  79 PR Interval:  190 QRS Duration: 96 QT Interval:  410 QTC Calculation: 470 R Axis:   44 Text Interpretation:  Sinus rhythm Anterior infarct,  old Nonspecific T  wave abnormality, improved in Inferior leads Since last tracing Confirmed  by Ethelda Chick  MD, SAM [phone]) on 09/21/2014 1:09:40 PM      MDM   Final diagnoses:  Intractable vomiting with nausea, vomiting of unspecified type   44 year old male with diabetes and hypertension. He's had intractable nausea and vomiting for the past 2-3 days. He's been unable to keep his medicines down including his BP meds. Tried phenergan, reglan and zofran in ED without resolution. His BP has been around 215/115 in ED. Give 5 of IV lopressor, recheck BP is 161/90. He denies any other symptoms. He's diaphoretic and ill looking. Actively vomiting during interview. Generalized abdominal tenderness. Gluc 313. No white count. Troponin 0.00. His creatinine is 2.15 which is up from his baseline of 1.5. [facility] to [facility] for [facility] for intractable nausea and vomiting, hypertension and likely pre-renal AKI. Pt accepted for admission.   Alveta Heimlich, PA-C 09/21/14 1951   Patient with intractable vomiting  After treatment with multiple  antiemetuics. Also c/o diffuse hadache. On exam alert ,gcs 15 noted to be hypertensive . Eyes :perrla eomi discs sharp. Neuro cn2-12 grosslyintact  Moves all extremites well, mot stregnth grossly 5/5/overall  Doug Sou, MD 09/22/14 1408

## 2014-09-21 NOTE — ED Provider Notes (Signed)
Complains of gradual onset headache 3 days ago accompanied by multiple episodes of vomiting. He's been unable to hold down any of his medications including his blood pressure medications. No other associated symptoms. Denies abdominal pain. On exam he is ill-appearing Glasgow Coma Score 15 HEENT exam no facial asymmetry, fundi benign neck is supple no signs of meningitis neurologic Glasgow Coma Score 15 was all extremity as well as 2 through 12 grossly intact  Doug Sou, MD 09/21/14 1306

## 2014-09-21 NOTE — ED Notes (Signed)
Pt has abd CT ordered. Will scan then transfer to [facility]

## 2014-09-21 NOTE — ED Notes (Signed)
Pt left for for CT scan of belly. Pt meds ready and waiting.7 units Insulin drawn up/ verified with another RN per sliding scale protocol. bloodsugar was 301( 7 units needed).

## 2014-09-22 ENCOUNTER — Observation Stay (HOSPITAL_COMMUNITY): Payer: Managed Care, Other (non HMO)

## 2014-09-22 DIAGNOSIS — E0841 Diabetes mellitus due to underlying condition with diabetic mononeuropathy: Secondary | ICD-10-CM | POA: Diagnosis not present

## 2014-09-22 DIAGNOSIS — N178 Other acute kidney failure: Secondary | ICD-10-CM | POA: Diagnosis not present

## 2014-09-22 DIAGNOSIS — G43A1 Cyclical vomiting, intractable: Secondary | ICD-10-CM | POA: Diagnosis not present

## 2014-09-22 DIAGNOSIS — I10 Essential (primary) hypertension: Secondary | ICD-10-CM | POA: Diagnosis not present

## 2014-09-22 LAB — CBC
HEMATOCRIT: 37.6 % — AB (ref 39.0–52.0)
HEMOGLOBIN: 12.9 g/dL — AB (ref 13.0–17.0)
MCH: 29.1 pg (ref 26.0–34.0)
MCHC: 34.3 g/dL (ref 30.0–36.0)
MCV: 84.9 fL (ref 78.0–100.0)
Platelets: 283 10*3/uL (ref 150–400)
RBC: 4.43 MIL/uL (ref 4.22–5.81)
RDW: 13.2 % (ref 11.5–15.5)
WBC: 8.5 10*3/uL (ref 4.0–10.5)

## 2014-09-22 LAB — BASIC METABOLIC PANEL
ANION GAP: 7 (ref 5–15)
BUN: 42 mg/dL — ABNORMAL HIGH (ref 6–20)
CHLORIDE: 105 mmol/L (ref 101–111)
CO2: 25 mmol/L (ref 22–32)
Calcium: 8 mg/dL — ABNORMAL LOW (ref 8.9–10.3)
Creatinine, Ser: 3.24 mg/dL — ABNORMAL HIGH (ref 0.61–1.24)
GFR calc Af Amer: 25 mL/min — ABNORMAL LOW (ref >60)
GFR, EST NON AFRICAN AMERICAN: 22 mL/min — AB (ref >60)
GLUCOSE: 155 mg/dL — AB (ref 65–99)
POTASSIUM: 4.5 mmol/L (ref 3.5–5.1)
Sodium: 137 mmol/L (ref 135–145)

## 2014-09-22 LAB — GLUCOSE, CAPILLARY
GLUCOSE-CAPILLARY: 230 mg/dL — AB (ref 65–99)
GLUCOSE-CAPILLARY: 185 mg/dL — AB (ref 65–99)
Glucose-Capillary: 266 mg/dL — ABNORMAL HIGH (ref 65–99)
Glucose-Capillary: 152 mg/dL — ABNORMAL HIGH (ref 65–99)
Glucose-Capillary: 177 mg/dL — ABNORMAL HIGH (ref 65–99)

## 2014-09-22 LAB — TSH: TSH: 5.092 u[IU]/mL — AB (ref 0.350–4.500)

## 2014-09-22 MED ORDER — IPRATROPIUM BROMIDE 0.02 % IN SOLN
0.5000 mg | Freq: Once | RESPIRATORY_TRACT | Status: AC
  Administered 2014-09-22: 0.5 mg via RESPIRATORY_TRACT
  Filled 2014-09-22: qty 2.5

## 2014-09-22 MED ORDER — CEFTRIAXONE SODIUM 1 G IJ SOLR
1.0000 g | INTRAMUSCULAR | Status: DC
  Administered 2014-09-22 – 2014-09-23 (×2): 1 g via INTRAVENOUS
  Filled 2014-09-22 (×2): qty 10

## 2014-09-22 MED ORDER — ALBUTEROL SULFATE (2.5 MG/3ML) 0.083% IN NEBU
5.0000 mg | INHALATION_SOLUTION | Freq: Once | RESPIRATORY_TRACT | Status: AC
  Administered 2014-09-22: 5 mg via RESPIRATORY_TRACT
  Filled 2014-09-22: qty 6

## 2014-09-22 MED ORDER — HYOSCYAMINE SULFATE 0.125 MG SL SUBL
0.2500 mg | SUBLINGUAL_TABLET | Freq: Once | SUBLINGUAL | Status: AC
  Administered 2014-09-22: 0.25 mg via SUBLINGUAL
  Filled 2014-09-22: qty 2

## 2014-09-22 NOTE — Progress Notes (Signed)
Patient ID: Russell [name], male   DOB: August 22, 1970, 44 y.o.   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 He denies fevers, chills, recent sick contacts or exposures, no specific abdominal pain, no urinary concerns. Pt also denies chest pain or shortness of breath.   In ED, pt noted to be uncomfortable due to vomiting. Blood work notable for Cr 2.15 (was WNL 4 months ago). [facility] asked to admit for further evaluation. SDU requested due to BP 215/115.  Assessment/Plan:    Principal Problem:  Intractable nausea and vomiting with left flank area pain  - unclear etiology at this time and possibly related to acute pyelonephritis  - CT abd and pelvis wo contrast suggestive of renal inflammation, ? Pyelonephritis  - placed on Rocephin 9/20 but somewhat strange as pt with no fever and normal WBC - if feels better and no further signs of an infectious etiology, may be able to stop Rocephin in 1-2 days  - continue to provide analgesia as needed   Active Problems:  Hypertensive urgency - likely exacerbated by N/V and poor oral intake - currently on Metoprolol 100 mg PO ER as per home regimen, Hydralazine 25 mg PO TID and as needed  - stopped Lisinopril due to ARF, also stopped Torsemide - BP is stable and at target range    Acute renal failure - appears to be pre renal etiology vs side effect from Lisinopril, Metformin - continue to hold Lisinopril, Metformin, Torsemide - follow up on urine Na and urine Cr, renal US also requested for clearer evaluation  - repeat BMP in AM   Uncontrolled diabetes  mellitus type 2 with peripheral artery disease - continue Lantus per home medical regimen - place on SSI until oral intake improves - continue to hold Metformin    Obesity - Body mass index is 33.92 kg/(m^2).   DVT (deep venous thrombosis), H/o 01/2014-on Xarelto - continue AC as per home medical regimen    Uncontrolled diabetes mellitus with retinopathy - outpatient follow up - d/w patient proper glucose monitoring and control of CBG's   Diabetic neuropathy - continue Gabapentin    Hypothyroidism (acquired) - continue Synthroid   DVT prophylaxis - on Xarelto   Code Status: Full.  Family Communication:  plan of care discussed with the patient Disposition Plan: Home when stable. Transfer to telemetry unit 9/20.   IV access:  Peripheral IV  Procedures and diagnostic studies:    Ct Abdomen Pelvis Wo Contrast 09/21/2014   Mild-moderate inflammation/stranding with small amount of retroperitoneal and free pelvic fluid. This is nonspecific but could be related to renal inflammation or infection. 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     Medical Consultants:  None  Other Consultants:  None  IAnti-Infectives:   Rocephin 9/20 -->  Debbora Presto, MD  [facility] Pager [phone]  If 7PM-7AM, please contact night-coverage [email or web address] Password [facility] 09/22/2014, 9:13 AM   LOS: 1 day   HPI/Subjective: No events overnight. Left flank area pain, 7/10 in severity and non radiating.  Objective: Filed Vitals:   09/22/14 0400 09/22/14 0540 09/22/14 0600 09/22/14  0730  BP: 134/92 128/66 118/78 131/104  Pulse: 71  73 81  Temp: 97.7 F (36.5 C)     TempSrc: Oral     Resp: Height:      Weight: 122.8 kg (270 lb 11.6 oz)     SpO2: 100%  95% 95%    Intake/Output Summary (Last 24 hours) at 09/22/14 0913 Last data filed at 09/22/14 0600  Gross per 24 hour  Intake   1260 ml  Output    600 ml  Net    660 ml     Exam:   General:  Pt is alert, follows commands appropriately, not in acute distress  Cardiovascular: Regular rate and rhythm, S1/S2, no murmurs, no rubs, no gallops  Respiratory: Clear to auscultation bilaterally, no wheezing, no crackles, no rhonchi  Abdomen: Soft, tender in left flank area, non distended, bowel sounds present, no guarding  Extremities: No edema, pulses DP and PT palpable bilaterally  Neuro: Grossly nonfocal  Data Reviewed: Basic Metabolic Panel:  Recent Labs Lab 09/21/14 1035 09/22/14 0405  NA 137 137  K 4.0 4.5  CL 103 105  CO2 26 25  GLUCOSE 313* 155*  BUN 33* 42*  CREATININE 2.15* 3.24*  CALCIUM 8.8* 8.0*   Liver Function Tests:  Recent Labs Lab 09/21/14 1035  AST 33  ALT 29  ALKPHOS 85  BILITOT 0.8  PROT 6.2*  ALBUMIN 2.7*    Recent Labs Lab 09/21/14 1035  LIPASE 17*   No results for input(s): AMMONIA in the last 168 hours. CBC:  Recent Labs Lab 09/21/14 1035 09/22/14 0405  WBC 9.2 8.5  HGB 15.7 12.9*  HCT 45.4 37.6*  MCV 84.2 84.9  PLT 293 283   Cardiac Enzymes: No results for input(s): CKTOTAL, CKMB, CKMBINDEX, TROPONINI in the last 168 hours. BNP: Invalid input(s): POCBNP CBG:  Recent Labs Lab 09/21/14 1018 09/21/14 1435 09/21/14 1740 09/21/14 2107 09/22/14 0733  GLUCAP 300* 301* 277* 266* 152*    Recent Results (from the past 240 hour(s))  MRSA PCR Screening     Status: None   Collection Time: 09/21/14  4:44 PM  Result Value Ref Range Status   MRSA by PCR NEGATIVE NEGATIVE Final    Comment:        The GeneXpert MRSA Assay (FDA approved for NASAL specimens only), is one component of a comprehensive MRSA colonization surveillance program. It is not intended to diagnose MRSA infection nor to guide or monitor treatment for MRSA infections.      Scheduled Meds: . cefTRIAXone (ROCEPHIN)  IV  1 g Intravenous Q24H  . citalopram  20 mg Oral Daily  . cyclobenzaprine  10 mg Oral QHS  .  gabapentin  600 mg Oral QID  . hydrALAZINE  25 mg Oral 3 times per day  . insulin aspart  0-5 Units Subcutaneous QHS  . insulin aspart  0-9 Units Subcutaneous TID WC  . insulin glargine  15 Units Subcutaneous BID  . levothyroxine  50 mcg Oral QAC breakfast  . metoprolol succinate  100 mg Oral Daily  . rivaroxaban  20 mg Oral Q supper   Continuous Infusions:

## 2014-09-23 ENCOUNTER — Observation Stay (HOSPITAL_COMMUNITY): Payer: Managed Care, Other (non HMO)

## 2014-09-23 DIAGNOSIS — N19 Unspecified kidney failure: Secondary | ICD-10-CM | POA: Diagnosis not present

## 2014-09-23 DIAGNOSIS — E0841 Diabetes mellitus due to underlying condition with diabetic mononeuropathy: Secondary | ICD-10-CM | POA: Diagnosis not present

## 2014-09-23 DIAGNOSIS — R34 Anuria and oliguria: Secondary | ICD-10-CM

## 2014-09-23 DIAGNOSIS — N179 Acute kidney failure, unspecified: Secondary | ICD-10-CM | POA: Diagnosis not present

## 2014-09-23 DIAGNOSIS — I10 Essential (primary) hypertension: Secondary | ICD-10-CM | POA: Diagnosis not present

## 2014-09-23 LAB — CBC
HCT: 40.2 % (ref 39.0–52.0)
Hemoglobin: 13.5 g/dL (ref 13.0–17.0)
MCH: 29.1 pg (ref 26.0–34.0)
MCHC: 33.6 g/dL (ref 30.0–36.0)
MCV: 86.6 fL (ref 78.0–100.0)
PLATELETS: 297 10*3/uL (ref 150–400)
RBC: 4.64 MIL/uL (ref 4.22–5.81)
RDW: 13.7 % (ref 11.5–15.5)
WBC: 8.7 10*3/uL (ref 4.0–10.5)

## 2014-09-23 LAB — GLUCOSE, CAPILLARY
GLUCOSE-CAPILLARY: 311 mg/dL — AB (ref 65–99)
Glucose-Capillary: 124 mg/dL — ABNORMAL HIGH (ref 65–99)
Glucose-Capillary: 217 mg/dL — ABNORMAL HIGH (ref 65–99)
Glucose-Capillary: 212 mg/dL — ABNORMAL HIGH (ref 65–99)
Glucose-Capillary: 177 mg/dL — ABNORMAL HIGH (ref 65–99)

## 2014-09-23 LAB — URINALYSIS, ROUTINE W REFLEX MICROSCOPIC
Bilirubin Urine: NEGATIVE
GLUCOSE, UA: 100 mg/dL — AB
Ketones, ur: NEGATIVE mg/dL
LEUKOCYTES UA: NEGATIVE
Nitrite: NEGATIVE
PH: 5 (ref 5.0–8.0)
Protein, ur: >300 mg/dL — AB
Specific Gravity, Urine: 1.013 (ref 1.005–1.030)
Urobilinogen, UA: 0.2 mg/dL (ref 0.0–1.0)

## 2014-09-23 LAB — PROTIME-INR
INR: 1.43 (ref 0.00–1.49)
Prothrombin Time: 17.5 seconds — ABNORMAL HIGH (ref 11.6–15.2)

## 2014-09-23 LAB — APTT: APTT: 37 s (ref 24–37)

## 2014-09-23 LAB — BASIC METABOLIC PANEL
Anion gap: 10 (ref 5–15)
BUN: 53 mg/dL — AB (ref 6–20)
CALCIUM: 8.1 mg/dL — AB (ref 8.9–10.3)
CO2: 24 mmol/L (ref 22–32)
Chloride: 96 mmol/L — ABNORMAL LOW (ref 101–111)
Creatinine, Ser: 5.9 mg/dL — ABNORMAL HIGH (ref 0.61–1.24)
GFR calc Af Amer: 12 mL/min — ABNORMAL LOW (ref >60)
GFR, EST NON AFRICAN AMERICAN: 11 mL/min — AB (ref >60)
GLUCOSE: 141 mg/dL — AB (ref 65–99)
POTASSIUM: 3.6 mmol/L (ref 3.5–5.1)
Sodium: 130 mmol/L — ABNORMAL LOW (ref 135–145)

## 2014-09-23 LAB — HEMOGLOBIN A1C
Hgb A1c MFr Bld: 10.3 % — ABNORMAL HIGH (ref 4.8–5.6)
Mean Plasma Glucose: 249 mg/dL

## 2014-09-23 LAB — CREATININE, URINE, RANDOM: Creatinine, Urine: 105.55 mg/dL

## 2014-09-23 LAB — URINE MICROSCOPIC-ADD ON

## 2014-09-23 LAB — SODIUM, URINE, RANDOM: Sodium, Ur: 23 mmol/L

## 2014-09-23 LAB — HEPARIN LEVEL (UNFRACTIONATED): Heparin Unfractionated: 2.16 IU/mL — ABNORMAL HIGH (ref 0.30–0.70)

## 2014-09-23 MED ORDER — METOPROLOL TARTRATE 25 MG PO TABS
25.0000 mg | ORAL_TABLET | Freq: Two times a day (BID) | ORAL | Status: DC
  Administered 2014-09-23: 25 mg via ORAL
  Filled 2014-09-23 (×2): qty 1

## 2014-09-23 MED ORDER — HEPARIN (PORCINE) IN NACL 100-0.45 UNIT/ML-% IJ SOLN
2100.0000 [IU]/h | INTRAMUSCULAR | Status: DC
  Administered 2014-09-23 – 2014-09-24 (×3): 1800 [IU]/h via INTRAVENOUS
  Administered 2014-09-26: 1950 [IU]/h via INTRAVENOUS
  Administered 2014-09-27 – 2014-09-29 (×3): 2100 [IU]/h via INTRAVENOUS
  Filled 2014-09-23 (×11): qty 250

## 2014-09-23 MED ORDER — SODIUM CHLORIDE 0.9 % IV BOLUS (SEPSIS)
500.0000 mL | Freq: Once | INTRAVENOUS | Status: AC
  Administered 2014-09-23: 500 mL via INTRAVENOUS

## 2014-09-23 MED ORDER — GABAPENTIN 300 MG PO CAPS
600.0000 mg | ORAL_CAPSULE | Freq: Every day | ORAL | Status: DC
  Administered 2014-09-23 – 2014-09-24 (×2): 600 mg via ORAL
  Filled 2014-09-23 (×3): qty 2

## 2014-09-23 NOTE — Progress Notes (Addendum)
ANTICOAGULATION CONSULT NOTE - Initial Consult  Pharmacy Consult for Heparin Indication: h/o DVT  Allergies  Allergen Reactions  . Ibuprofen Other (See Comments)    MD told him not to take due to kidney disease.    Patient Measurements: Height:  (188 cm) Weight: 271 lb 6.2 oz (123.1 kg) IBW/kg (Calculated) : 82.2 Heparin Dosing Weight: 108 kg  Vital Signs: Temp: 98.2 F (36.8 C) (09/21 1351) Temp Source: Oral (09/21 1351) BP: 115/75 mmHg (09/21 1351) Pulse Rate: 86 (09/21 1351)  Labs:  Recent Labs  09/21/14 1035 09/22/14 0405 09/23/14 0530  HGB 15.7 12.9* 13.5  HCT 45.4 37.6* 40.2  PLT 293 283 297  CREATININE 2.15* 3.24* 5.90*    Estimated Creatinine Clearance: 22.5 mL/min (by C-G formula based on Cr of 5.9).   Medical History: Past Medical History  Diagnosis Date  . Thyroid disease   . Diabetes mellitus without complication   . Hypertension   . Nephrotic syndrome 05/18/2014  . DVT (deep venous thrombosis), H/o 01/2014-on Xarelto 03/24/2014  . Secondary DM with DKA-AG=16, BIcarb Nl 03/24/2014    Assessment: 80 yoM with PMH HTN, HLD, DM2, on Xarelto PTA for history of DVT in January 2016 in the setting of nephrotic syndrome admitted with intractable nausea, vomiting with left flank area pain, hypertensive urgency, and AKI.  Xarelto was resumed on admission but SCr continues to worsen so switching to IV heparin.  Last dose of Xarelto 20 mg was 9/20 at ~1700.  Baseline aPTT and HL ordered.  Heparin levels will be elevated from Xarelto.  Will need to continue to monitor both aPTTs and HLs until levels correlate (when HL is no longer being effected by Xarelto).  Anticipate slow clearance of Xarelto with AKI.  CBC WNL, SCr 5.90, CrCl~27 ml/min (CG, TBW)  Goal of Therapy:  Heparin level 0.3-0.7 units/ml aPTT 66-102 seconds Monitor platelets by anticoagulation protocol: Yes   Plan:  1.  Follow up aPTT and heparin level ordered now.  Then start heparin infusion  (without bolus) at 1800 units/hr (18 mL/hr) at 1800 (~24 hours since last dose of Xarelto). 2.  Check aPTT 8 hours after start of infusion.  Check heparin with aPTT once daily in AM to see when levels correlate and effects of Xarelto on HL has diminished, then will begin following heparin levels only. 3.  Check CBC daily while on heparin infusion.  Clance Boll 09/23/2014,2:49 PM

## 2014-09-23 NOTE — Progress Notes (Signed)
PROGRESS NOTE  SHA AMER [id number] DOB: 06-14-1970 DOA: 09/21/2014 PCP: [name], MD  HPI: Pt is 44 yo male with known HTN, HLD, DM type II with complications of neuropathy and retinopathy, obesity presented to [facility] ED with main concern of several days duration of progressive nausea and non bloody vomiting, poor oral intake, unable to take any medications due to his symptoms. He denies fevers, chills, recent sick contacts or exposures, no specific abdominal pain, no urinary concerns. Pt also denies chest pain or shortness of breath. In ED, pt noted to be uncomfortable due to vomiting. Blood work notable for Cr 2.15 (was WNL 4 months ago). [facility] asked to admit for further evaluation. SDU requested due to BP 215/115  Subjective / 24 H Interval events - endorses more abdominal distention this morning, and abdominal pain - denies shortness of breath, denies nausea/vomiting  Assessment/Plan: Principal Problem:   Intractable nausea and vomiting Active Problems:   DVT (deep venous thrombosis), H/o 01/2014-on Xarelto   Hypothyroidism (acquired)   Uncontrolled diabetes mellitus with retinopathy   Hypertensive urgency   Acute renal failure   Diabetic neuropathy   Uncontrolled diabetes mellitus type 2 with peripheral artery disease   Morbid obesity   Nausea with vomiting   Acute on chronic renal failure - etiology not entirely clear, he is now becoming oliguric with only ~150 cc UOP today, and his Creatinine jumped to 5.9 this morning. - consulted nephrology, discussed with Dr. Arlean Hopping - patient reportedly had a renal vein clotting on renal artery [place] at [facility] in July and also had non specific bilateral perinephric stranding - start heparin gtt now cannot use NOAC given renal failure and obtain renal dopplers US to re-evaluate.  - given early signs of fluid overload with LE edema, transfer to [facility] as he may need HD in 1-2 days. Discussed with my partner Dr.  Roda Shutters.  Intractable nausea and vomiting with left flank area pain  - unclear etiology at this time and possibly related to #1, improving, able to eat better - CT abd and pelvis wo contrast suggestive of renal inflammation, ? Pyelonephritis  - placed on Rocephin 9/20 but somewhat strange as pt with no fever and normal WBC, will discontinue as it is unlikely to be infectious - continue to provide analgesia as needed   Hypertensive urgency - likely exacerbated by N/V and poor oral intake - currently on Metoprolol 100 mg PO ER as per home regimen, Hydralazine 25 mg PO TID and as needed  - stopped Lisinopril due to ARF, also stopped Torsemide - BP is stable and at target range   Mixed restrictive/obstructive pulmonary defect - stable, albuterol as needed - he has some upper airway wheezing (? VCD) however lung exam is clear  Hyponatremia - likely in the setting of fluid overload, monitor  Uncontrolled diabetes mellitus type 2 with peripheral artery disease - continue Lantus per home medical regimen, CBGs still elevated - place on SSI until oral intake improves - continue to hold Metformin   Obesity - Body mass index is 33.92 kg/(m^2).  History of superficial venous thrombosis, H/o 01/2014  - Xarelto now on hold - he apparently was started on anticoagulation and at one point this was discontinued. He was evaluated by Hem[facility]Onc at [facility] in July with recommendations that [facility] is continued given extension of his superficial clot. - start heparin gtt, Renal v duplex (07/10/14): Negative for renal vein thromboses or renal artery stenosis bilaterally  Uncontrolled diabetes mellitus with retinopathy -  outpatient follow up - d/w patient proper glucose monitoring and control of CBG's  Diabetic neuropathy - continue Gabapentin   Hypothyroidism (acquired) - continue Synthroid    Diet: Diet regular Room service appropriate?: Yes; Fluid consistency:: Thin Fluids: none  DVT Prophylaxis: heparin  gtt  Code Status: Full Code Family Communication: d/w wife bedside  Disposition Plan: transfer to [facility]  Consultants:  Nephrology  Procedures:  None   Antibiotics Ceftriaxone 9/20 >> 9/21   Studies  US Renal  09/23/2014   CLINICAL DATA:  Acute renal failure.  EXAM: RENAL / URINARY TRACT ULTRASOUND COMPLETE  COMPARISON:  CT 09/21/2014.  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 No focal mass. No hydronephrosis. Echotexture normal.  Left Kidney:  Horseshoe kidney. Left portion measures 12.9 cm. No focal mass. No hydronephrosis. Echotexture normal .  Bladder:  Bladder is nondistended.  IMPRESSION: Horseshoe kidney. Renal normal. No hydronephrosis. No bladder distention. No acute abnormality.   Electronically Signed   By: Maisie Fus  Register   On: 09/23/2014 07:10   Dg Abd Acute W/chest  09/22/2014   CLINICAL DATA:  Abdominal tension, nausea, vomiting and RIGHT upper quadrant abdominal pain extending across abdomen, hypertension, diabetes mellitus, nephrotic syndrome  EXAM: DG ABDOMEN ACUTE W/ 1V CHEST  COMPARISON:  Chest radiograph 05/26/2014, CT abdomen pelvis 09/20/2014  FINDINGS: Borderline enlargement of cardiac silhouette.  Mediastinal contours and pulmonary vascularity normal.  Minimal LEFT basilar atelectasis.  Prominent RIGHT first costochondral junction unchanged.  No infiltrate, pleural effusion or pneumothorax.  Retained contrast in colon.  Nonobstructive bowel gas pattern.  No bowel dilatation or bowel wall thickening or free air.  Gas in rectum.  No urinary tract calcification or acute osseous findings.  IMPRESSION: LEFT basilar atelectasis.  Nonobstructive bowel gas pattern.   Electronically Signed   By: Ulyses Southward M.D.   On: 09/22/2014 22:30    Objective  Filed Vitals:   09/22/14 2040 09/23/14 0522 09/23/14 0603 09/23/14 1351  BP: 146/67 158/93  115/75  Pulse: 84 86  86  Temp: 98.9 F (37.2 C) 97.9 F (36.6 C)  98.2 F (36.8 C)  TempSrc:  Oral Oral  Oral  Resp: Height:      Weight:  123.1 kg (271 lb 6.2 oz)    SpO2: 98% 98% 97% 97%    Intake/Output Summary (Last 24 hours) at 09/23/14 1721 Last data filed at 09/23/14 1600  Gross per 24 hour  Intake    410 ml  Output    214 ml  Net    196 ml   Filed Weights   09/21/14 1700 09/22/14 0400 09/23/14 0522  Weight: 119.9 kg (264 lb 5.3 oz) 122.8 kg (270 lb 11.6 oz) 123.1 kg (271 lb 6.2 oz)    Exam:  GENERAL: NAD  HEENT: head NCAT, no scleral icterus. Pupils round and reactive. Mucous membranes are moist. Posterior pharynx clear of any exudate or lesions.  NECK: Supple. No carotid bruits. No lymphadenopathy or thyromegaly.  LUNGS: Clear to auscultation. No wheezing or crackles  HEART: Regular rate and rhythm without murmur. 2+ pulses, no JVD, no peripheral edema  ABDOMEN: Soft, nontender, and nondistended. Positive bowel sounds. No hepatosplenomegaly was noted.  EXTREMITIES: Without any cyanosis, clubbing, rash, lesions or edema.  NEUROLOGIC: Alert and oriented x3. Cranial nerves II through XII are grossly intact. Strength 5/5 in all 4.  PSYCHIATRIC: Normal mood and affect  SKIN: No ulceration or induration present.  Data Reviewed: Basic  Metabolic Panel:  Recent Labs Lab 09/21/14 1035 09/22/14 0405 09/23/14 0530  NA 137 137 130*  K 4.0 4.5 3.6  CL 103 105 96*  CO2 GLUCOSE 313* 155* 141*  BUN 33* 42* 53*  CREATININE 2.15* 3.24* 5.90*  CALCIUM 8.8* 8.0* 8.1*   Liver Function Tests:  Recent Labs Lab 09/21/14 1035  AST 33  ALT 29  ALKPHOS 85  BILITOT 0.8  PROT 6.2*  ALBUMIN 2.7*    Recent Labs Lab 09/21/14 1035  LIPASE 17*   CBC:  Recent Labs Lab 09/21/14 1035 09/22/14 0405 09/23/14 0530  WBC 9.2 8.5 8.7  HGB 15.7 12.9* 13.5  HCT 45.4 37.6* 40.2  MCV 84.2 84.9 86.6  PLT 293 283 297   CBG:  Recent Labs Lab 09/22/14 1623 09/22/14 2037 09/23/14 0738 09/23/14 1150 09/23/14 1639  GLUCAP 177* 185* 124*  217* 311*    Recent Results (from the past 240 hour(s))  MRSA PCR Screening     Status: None   Collection Time: 09/21/14  4:44 PM  Result Value Ref Range Status   MRSA by PCR NEGATIVE NEGATIVE Final    Comment:        The GeneXpert MRSA Assay (FDA approved for NASAL specimens only), is one component of a comprehensive MRSA colonization surveillance program. It is not intended to diagnose MRSA infection nor to guide or monitor treatment for MRSA infections.      Scheduled Meds: . cefTRIAXone (ROCEPHIN)  IV  1 g Intravenous Q24H  . citalopram  20 mg Oral Daily  . cyclobenzaprine  10 mg Oral QHS  . gabapentin  600 mg Oral Daily  . hydrALAZINE  25 mg Oral 3 times per day  . insulin aspart  0-5 Units Subcutaneous QHS  . insulin aspart  0-9 Units Subcutaneous TID WC  . insulin glargine  15 Units Subcutaneous BID  . levothyroxine  50 mcg Oral QAC breakfast  . metoprolol tartrate  25 mg Oral BID   Continuous Infusions: . heparin     Time spent: 50 minutes, more than 50% bedside discussing with patient and his family, discussing with Nephrology and coordinating care for transfer.  Pamella Pert, MD [facility] Pager [phone]. If 7 PM - 7 AM, please contact night-coverage at [email or web address], password [facility] 09/23/2014, 5:21 PM  LOS: 2 days

## 2014-09-23 NOTE — Consult Note (Signed)
Renal Service Consult Note [facility]  Russell [name] 09/23/2014 Russell [name] Requesting Physician:  Dr Russell [name]  Reason for Consult:  Acute on CRF HPI: The patient is a 44 y.o. year-old with hx DM x 10 years (+retinopathy/ neuropathy), HTN, DVT in Jan this year and CKD w neph syndrome diagnosed around Feb this year. He was admitted at [facility] for almost 3 weeks primarily for vol overload/ edema, n/v and then after diuresis he underwent open renal biopsy of his horseshoe kidney. Unfortunately the biopsies were "too deep" and they didn't get a diagnosis for his neph syndrome.  He sought a second opinion and saw MD at [facility] on 8.23/16, where he was dx'd with CKD stage 3 w creat about 1.5, neph syndrome uncertain cause, HTN and hx DVT. He was vol overloaded and ACEi was resumed and torsemide was increased to 100 mg am and 80 mg pm. F/U in 1 month was scheduled.    Pt presented on 9/19 to ED reporting n/v , abd pain and HA.  Not keeping meals down. BP was high at 186/123 in ED. Throwing up BP meds. N/V for 2-3 days. Hx of CKD f/b Dr Russell [name]. Creat was 2.15 on admission, 3.24 yest am and 5.90 this am.  He had a CT abd done on 9.19 with NO IV contrast which showed horseshoe kidney with significant perineal stranding bilaterally. Patient's main complaint is abd pain, distension and nausea. +LE edema. Mild wheezing.    Past Medical History  Past Medical History  Diagnosis Date  . Thyroid disease   . Diabetes mellitus without complication   . Hypertension   . Nephrotic syndrome 05/18/2014  . DVT (deep venous thrombosis), H/o 01/2014-on Xarelto 03/24/2014  . Secondary DM with DKA-AG=16, BIcarb Nl 03/24/2014   Past Surgical History  Past Surgical History  Procedure Laterality Date  . Right ankle fx repair    . Eye surgery    . Esophagogastroduodenoscopy N/A 03/29/2014    Procedure: ESOPHAGOGASTRODUODENOSCOPY (EGD);  Surgeon: Dorena Cookey, MD;  Location: [facility];  Service:  Endoscopy;  Laterality: N/A;  . Tonsillectomy and adenoidectomy     Family History  Family History  Problem Relation Age of Onset  . Obesity Mother     Patient states that family members have no other medical illnesses other than what I have described  . Heart disease      No family history  . Cancer Father 54    AML   Social History  reports that he has never smoked. He has never used smokeless tobacco. He reports that he does not drink alcohol or use illicit drugs. Allergies  Allergies  Allergen Reactions  . Ibuprofen Other (See Comments)    MD told him not to take due to kidney disease.   Home medications Prior to Admission medications   Medication Sig Start Date End Date Taking? Authorizing Russell [name]  acetaminophen (TYLENOL) 500 MG tablet Take 1,000 mg by mouth every 6 (six) hours as needed for moderate pain.   Yes Historical Russell Lekas, MD  albuterol (PROVENTIL HFA;VENTOLIN HFA) 108 (90 BASE) MCG/ACT inhaler Inhale 1 puff into the lungs every 6 (six) hours as needed for wheezing or shortness of breath.   Yes Historical Russell Lorentz, MD  albuterol-ipratropium (COMBIVENT) 18-103 MCG/ACT inhaler Inhale 2 puffs into the lungs every 4 (four) hours. 09/03/14  Yes Russell Sidle, MD  ALPRAZolam Prudy Feeler) 0.5 MG tablet TAKE 1 TABLET BY MOUTH TWICE A DAY AS NEEDED FOR SLEEP 08/11/14  Yes  Russell Bamberg, PA-C  citalopram (CELEXA) 20 MG tablet Take 1 tablet (20 mg total) by mouth daily. TAKE 1 TABLET (20 MG TOTAL) BY MOUTH DAILY    "OV NEEDED FOR REFILLS" 09/02/14  Yes Russell Jeffery, PA-C  cyclobenzaprine (FLEXERIL) 10 MG tablet Take 1 tablet (10 mg total) by mouth at bedtime. 08/11/14  Yes Russell Bamberg, PA-C  gabapentin (NEURONTIN) 600 MG tablet Take 1 tablet (600 mg total) by mouth 4 (four) times daily. 08/11/14  Yes Russell Bamberg, PA-C  HYDROcodone-acetaminophen (NORCO/VICODIN) 5-325 MG per tablet Take 1 tablet by mouth every 6 (six) hours as needed for moderate pain. 09/03/14  Yes Russell Sidle, MD  insulin  glargine (LANTUS) 100 UNIT/ML injection Inject 0.15 mLs (15 Units total) into the skin 2 (two) times daily. Patient taking differently: Inject 30 Units into the skin daily.  04/06/14  Yes Russell Arms, MD  Insulin Lispro, Human, (HUMALOG KWIKPEN) 200 UNIT/ML SOPN Inject 33 Units into the skin 3 (three) times daily before meals. Patient taking differently: Inject 15-80 Units into the skin 3 (three) times daily before meals.  07/31/14  Yes Russell Pavlov, MD  levothyroxine (SYNTHROID, LEVOTHROID) 50 MCG tablet Take 1 tablet (50 mcg total) by mouth daily. 05/02/13  Yes Russell Sidle, MD  lisinopril (PRINIVIL,ZESTRIL) 10 MG tablet TAKE 1 TABLET (10 MG TOTAL) BY MOUTH DAILY) 06/24/14  Yes Russell Jeffery, PA-C  metFORMIN (GLUCOPHAGE) 1000 MG tablet Take 1,000 mg by mouth 2 (two) times daily with a meal.   Yes Historical Russell Kory, MD  metoprolol succinate (TOPROL-XL) 100 MG 24 hr tablet Take 1 tablet (100 mg total) by mouth daily. 05/20/14  Yes Russell Bunting, MD  promethazine (PHENERGAN) 25 MG tablet Take 1 tablet (25 mg total) by mouth every 6 (six) hours as needed for nausea. 09/03/14  Yes Russell Sidle, MD  rivaroxaban (XARELTO) 20 MG TABS tablet Take 1 tablet (20 mg total) by mouth daily with supper. 03/13/14  Yes Russell Sidle, MD  torsemide (DEMADEX) 20 MG tablet Take 1 tablet (20 mg total) by mouth daily. 04/22/14  Yes Russell Bunting, MD  traMADol (ULTRAM) 50 MG tablet Take 1 tablet (50 mg total) by mouth every 8 (eight) hours as needed. 05/26/14  Yes Russell Sidle, MD  albuterol (PROVENTIL) (2.5 MG/3ML) 0.083% nebulizer solution Take 3 mLs (2.5 mg total) by nebulization every 6 (six) hours as needed for wheezing or shortness of breath. 09/03/14   Russell Sidle, MD  glucose blood (ACCU-CHEK AVIVA) test strip Use 6-8x a day 07/31/14   Russell Pavlov, MD   Liver Function Tests  Recent Labs Lab 09/21/14 1035  AST 33  ALT 29  ALKPHOS 85  BILITOT 0.8  PROT 6.2*  ALBUMIN 2.7*     Recent Labs Lab 09/21/14 1035  LIPASE 17*   CBC  Recent Labs Lab 09/21/14 1035 09/22/14 0405 09/23/14 0530  WBC 9.2 8.5 8.7  HGB 15.7 12.9* 13.5  HCT 45.4 37.6* 40.2  MCV 84.2 84.9 86.6  PLT 293 283 297   Basic Metabolic Panel  Recent Labs Lab 09/21/14 1035 09/22/14 0405 09/23/14 0530  NA 137 137 130*  K 4.0 4.5 3.6  CL 103 105 96*  CO2 GLUCOSE 313* 155* 141*  BUN 33* 42* 53*  CREATININE 2.15* 3.24* 5.90*  CALCIUM 8.8* 8.0* 8.1*    Filed Vitals:   09/22/14 2040 09/23/14 0522 09/23/14 0603 09/23/14 1351  BP: 146/67 158/93  115/75  Pulse: 84 86  86  Temp: 98.9 F (37.2 C) 97.9 F (36.6 C)  98.2 F (36.8 C)  TempSrc: Oral Oral  Oral  Resp: Height:      Weight:  123.1 kg (271 lb 6.2 oz)    SpO2: 98% 98% 97% 97%   Exam Alert, slightly dyspneic, audible wheezing, not in distress No rash, cyanosis or gangrene Sclera anicteric, throat clear No jvd, flat neck veins Chest scant basilar rales, diffuse mild exp wheezes scattered RRR no MRG Abd distended, tympanitic, BS present but diminished, no rebound or peritoneal signs Rectal deferred GU normal male Ext 1-2 + pitting LE edema bilat, minimal thigh edema bilat Neuro is alert, Ox 3, appropriate  Urine sediment > numerous granular casts, 3-5 wbc per hpf, 2-4 rbc per hpf, no cellular casts UA > 1.025, >300 prot, >1000 glu, 3-6 wbc, 3-6 rbc CXR > left base atx otherwise wnl CT abd > lower chest clear, liver/ GB/ panc/ spleen are normal. Horseshoe kidney noted w mild/mod perinephric stranding and small amt of fluid within the air renal spaces extending into the retroperitoneum into the pelvis. No hydro or stones. Adrenals and bladder are normal. Bowel w/o signs of obstruction or wall thickening. Prostate wnl. Trace amount of free pelvic fluid.   Assessment: 1. Acute on CKD 3 - significant problem w oliguric AKI and rapidly rising creatinine. This patient was at [facility] in July with DVT and  mention of "renal vein clot" in one of the discharge summaries.  His sediment is c/w ATN . He has lots of abd symptoms (pain, n/v) and signs of renal edema/ inflammation on CT.  There is no suggestion of acute GN on sediment.  He is nephrotic and at risk for RVT.  Have d/w radiology/ IR, plan is to get stat study with doppler US tonight to assess for renal vein patency. The next study would be MRI w no contrast. If those don't work we will be forced to use contrast for a CT venogram.  Plan transfer to [facility]. Will need HD soon. He is not vol depleted and doesn't need fluids. Will start on empiric anticoagulation with heparin until the diagnosis can be ruled out or in.  Have d/w primary MD.   2. DM 10 yrs w complications 3. Volume excess- no pulm edema though 4. HTN on norvasc/ hydralazine, avoiding acei for now with AKI 5. Hx DVT earlier this year at [facility] 6. Nephrotic syndrome   Plan- as above  [name] MD (pgr) [phone]    [phone] 09/23/2014, 3:28 PM

## 2014-09-23 NOTE — Care Management Note (Signed)
Case Management Note  Patient Details  Name: ANDREN BETHEA MRN: [id number] Date of Birth: 12/04/1970  Subjective/Objective: 44 y/o m admitted w/Intractable n/v.From home.                   Action/Plan:d/c plan home.   Expected Discharge Date:   (unknown)               Expected Discharge Plan:  Home/Self Care  In-House Referral:     Discharge planning Services  CM Consult  Post Acute Care Choice:    Choice offered to:     DME Arranged:    DME Agency:     HH Arranged:    HH Agency:     Status of Service:  In process, will continue to follow  Medicare Important Message Given:    Date Medicare IM Given:    Medicare IM give by:    Date Additional Medicare IM Given:    Additional Medicare Important Message give by:     If discussed at Long Length of Stay Meetings, dates discussed:    Additional Comments:  Lanier Clam, RN 09/23/2014, 5:14 PM

## 2014-09-23 NOTE — Progress Notes (Signed)
Patient is transferring to [facility], [facility] to transport patient.

## 2014-09-23 NOTE — Progress Notes (Signed)
*  PRELIMINARY RESULTS* Vascular Ultrasound Renal Artery Duplex has been completed.   Study was very technically limited due to excessive bowel gas. There is no obvious evidence of renal artery stenosis involving the visualized segments. Bilateral renal veins are patent by color flow doppler.  Preliminary results discussed with Dr. Arlean Hopping.  09/23/2014 6:39 PM Gertie Fey, RVT, RDCS, RDMS

## 2014-09-24 ENCOUNTER — Observation Stay (HOSPITAL_COMMUNITY): Payer: Managed Care, Other (non HMO)

## 2014-09-24 DIAGNOSIS — Z86718 Personal history of other venous thrombosis and embolism: Secondary | ICD-10-CM | POA: Diagnosis not present

## 2014-09-24 DIAGNOSIS — N183 Chronic kidney disease, stage 3 (moderate): Secondary | ICD-10-CM | POA: Diagnosis present

## 2014-09-24 DIAGNOSIS — N049 Nephrotic syndrome with unspecified morphologic changes: Secondary | ICD-10-CM | POA: Diagnosis present

## 2014-09-24 DIAGNOSIS — D6859 Other primary thrombophilia: Secondary | ICD-10-CM | POA: Diagnosis present

## 2014-09-24 DIAGNOSIS — R1110 Vomiting, unspecified: Secondary | ICD-10-CM | POA: Diagnosis present

## 2014-09-24 DIAGNOSIS — I129 Hypertensive chronic kidney disease with stage 1 through stage 4 chronic kidney disease, or unspecified chronic kidney disease: Secondary | ICD-10-CM | POA: Diagnosis present

## 2014-09-24 DIAGNOSIS — Z79899 Other long term (current) drug therapy: Secondary | ICD-10-CM | POA: Diagnosis not present

## 2014-09-24 DIAGNOSIS — Z7901 Long term (current) use of anticoagulants: Secondary | ICD-10-CM | POA: Diagnosis not present

## 2014-09-24 DIAGNOSIS — N179 Acute kidney failure, unspecified: Secondary | ICD-10-CM | POA: Diagnosis present

## 2014-09-24 DIAGNOSIS — Z794 Long term (current) use of insulin: Secondary | ICD-10-CM | POA: Diagnosis not present

## 2014-09-24 DIAGNOSIS — E11319 Type 2 diabetes mellitus with unspecified diabetic retinopathy without macular edema: Secondary | ICD-10-CM | POA: Diagnosis present

## 2014-09-24 DIAGNOSIS — Z886 Allergy status to analgesic agent status: Secondary | ICD-10-CM | POA: Diagnosis not present

## 2014-09-24 DIAGNOSIS — R34 Anuria and oliguria: Secondary | ICD-10-CM | POA: Diagnosis not present

## 2014-09-24 DIAGNOSIS — Z6836 Body mass index (BMI) 36.0-36.9, adult: Secondary | ICD-10-CM | POA: Diagnosis not present

## 2014-09-24 DIAGNOSIS — E1122 Type 2 diabetes mellitus with diabetic chronic kidney disease: Secondary | ICD-10-CM | POA: Diagnosis present

## 2014-09-24 DIAGNOSIS — R112 Nausea with vomiting, unspecified: Secondary | ICD-10-CM | POA: Diagnosis present

## 2014-09-24 DIAGNOSIS — E1165 Type 2 diabetes mellitus with hyperglycemia: Secondary | ICD-10-CM | POA: Diagnosis present

## 2014-09-24 DIAGNOSIS — N170 Acute kidney failure with tubular necrosis: Secondary | ICD-10-CM | POA: Diagnosis present

## 2014-09-24 DIAGNOSIS — E039 Hypothyroidism, unspecified: Secondary | ICD-10-CM | POA: Diagnosis present

## 2014-09-24 DIAGNOSIS — E1151 Type 2 diabetes mellitus with diabetic peripheral angiopathy without gangrene: Secondary | ICD-10-CM | POA: Diagnosis present

## 2014-09-24 DIAGNOSIS — E119 Type 2 diabetes mellitus without complications: Secondary | ICD-10-CM | POA: Diagnosis not present

## 2014-09-24 DIAGNOSIS — E871 Hypo-osmolality and hyponatremia: Secondary | ICD-10-CM | POA: Diagnosis present

## 2014-09-24 DIAGNOSIS — I10 Essential (primary) hypertension: Secondary | ICD-10-CM | POA: Diagnosis not present

## 2014-09-24 DIAGNOSIS — E1140 Type 2 diabetes mellitus with diabetic neuropathy, unspecified: Secondary | ICD-10-CM | POA: Diagnosis present

## 2014-09-24 DIAGNOSIS — E1121 Type 2 diabetes mellitus with diabetic nephropathy: Secondary | ICD-10-CM | POA: Diagnosis present

## 2014-09-24 DIAGNOSIS — N189 Chronic kidney disease, unspecified: Secondary | ICD-10-CM | POA: Diagnosis not present

## 2014-09-24 DIAGNOSIS — Z79891 Long term (current) use of opiate analgesic: Secondary | ICD-10-CM | POA: Diagnosis not present

## 2014-09-24 LAB — BASIC METABOLIC PANEL
ANION GAP: 12 (ref 5–15)
BUN: 74 mg/dL — ABNORMAL HIGH (ref 6–20)
CALCIUM: 8.1 mg/dL — AB (ref 8.9–10.3)
CO2: 22 mmol/L (ref 22–32)
CREATININE: 6.41 mg/dL — AB (ref 0.61–1.24)
Chloride: 100 mmol/L — ABNORMAL LOW (ref 101–111)
GFR calc non Af Amer: 10 mL/min — ABNORMAL LOW (ref >60)
GFR, EST AFRICAN AMERICAN: 11 mL/min — AB (ref >60)
Glucose, Bld: 165 mg/dL — ABNORMAL HIGH (ref 65–99)
Potassium: 4.4 mmol/L (ref 3.5–5.1)
SODIUM: 134 mmol/L — AB (ref 135–145)

## 2014-09-24 LAB — GLUCOSE, CAPILLARY
GLUCOSE-CAPILLARY: 200 mg/dL — AB (ref 65–99)
GLUCOSE-CAPILLARY: 222 mg/dL — AB (ref 65–99)
GLUCOSE-CAPILLARY: 152 mg/dL — AB (ref 65–99)

## 2014-09-24 LAB — CBC
HEMATOCRIT: 36.9 % — AB (ref 39.0–52.0)
Hemoglobin: 12.4 g/dL — ABNORMAL LOW (ref 13.0–17.0)
MCH: 28.8 pg (ref 26.0–34.0)
MCHC: 33.6 g/dL (ref 30.0–36.0)
MCV: 85.6 fL (ref 78.0–100.0)
PLATELETS: 271 10*3/uL (ref 150–400)
RBC: 4.31 MIL/uL (ref 4.22–5.81)
RDW: 13.3 % (ref 11.5–15.5)
WBC: 7.2 10*3/uL (ref 4.0–10.5)

## 2014-09-24 LAB — APTT
APTT: 71 s — AB (ref 24–37)
aPTT: 80 seconds — ABNORMAL HIGH (ref 24–37)

## 2014-09-24 MED ORDER — LEVOTHYROXINE SODIUM 75 MCG PO TABS
75.0000 ug | ORAL_TABLET | Freq: Every day | ORAL | Status: DC
  Administered 2014-09-25 – 2014-09-29 (×5): 75 ug via ORAL
  Filled 2014-09-24 (×6): qty 1

## 2014-09-24 MED ORDER — IPRATROPIUM-ALBUTEROL 0.5-2.5 (3) MG/3ML IN SOLN
3.0000 mL | Freq: Four times a day (QID) | RESPIRATORY_TRACT | Status: DC
  Administered 2014-09-24 – 2014-09-26 (×8): 3 mL via RESPIRATORY_TRACT
  Filled 2014-09-24 (×10): qty 3

## 2014-09-24 MED ORDER — GABAPENTIN 100 MG PO CAPS
100.0000 mg | ORAL_CAPSULE | Freq: Every day | ORAL | Status: DC
  Administered 2014-09-25 – 2014-09-28 (×4): 100 mg via ORAL
  Filled 2014-09-24 (×4): qty 1

## 2014-09-24 MED ORDER — FUROSEMIDE 10 MG/ML IJ SOLN
80.0000 mg | Freq: Once | INTRAMUSCULAR | Status: AC
  Administered 2014-09-24: 80 mg via INTRAVENOUS
  Filled 2014-09-24: qty 8

## 2014-09-24 NOTE — Progress Notes (Signed)
ANTICOAGULATION CONSULT NOTE - FOLLOW UP    aPTT = 80 (goal 66 - 102 sec) Heparin dosing weight = 112 kg   Assessment: 44 YOM continues on IV heparin for history of DVT and rule out renal vein thrombosis while Xarelto is on hold.  APTT is therapeutic; no bleeding reported.   Plan: - Continue heparin gtt at 1800 units/hr - aPTT / HL / CBC in AM    Thuy D. Laney Potash, PharmD, BCPS 09/24/2014, 7:09 PM

## 2014-09-24 NOTE — Progress Notes (Signed)
Inpatient Diabetes Program Recommendations  AACE/ADA: New Consensus Statement on Inpatient Glycemic Control (2015)  Target Ranges:  Prepandial:   less than 140 mg/dL      Peak postprandial:   less than 180 mg/dL (1-2 hours)      Critically ill patients:  140 - 180 mg/dL   Results for Russell [name], Russell [name] (MRN [id number]) as of 09/24/2014 10:26  Ref. Range 09/23/2014 07:38 09/23/2014 11:50 09/23/2014 16:39 09/23/2014 20:00 09/23/2014 21:00 09/24/2014 08:24  Glucose-Capillary Latest Ref Range: 65-99 mg/dL 409 (H) 811 (H) 914 (H) 212 (H) 177 (H) 200 (H)   Review of Glycemic Control  Diabetes history: DM 2 Outpatient Diabetes medications: Lantus 15 units BID, Novolog 15-80 units TID meal coverage, Metformin 1,000 mg BID Current orders for Inpatient glycemic control: Lantus 15 units BID, Novolog Sensitive + HS scale  Inpatient Diabetes Program Recommendations:  Insulin - Meal Coverage: Patient takes at least 15 units of meal coverage at home TID. Glucose increased into the 300's around meal times yesterday. Please consider starting Novolog 5 units meal coverage TID in addition to correction.  Thanks,  Christena Deem RN, MSN, [facility] Inpatient Diabetes Coordinator Team Pager [phone] (8a-5p)

## 2014-09-24 NOTE — Progress Notes (Signed)
[facility] Progress Note   Subjective: still wheezing, no sig SOB.  Vasc doppler US of renal veins yest showed patent veins bilat (verbal report from tech).  He is asking about IV lasix.  Creat pending today.  Foley bag with sig UOP 300-400 cc in bag this am.    Filed Vitals:   09/23/14 2048 09/24/14 0439 09/24/14 0845 09/24/14 0929  BP: 129/73 106/79  116/49  Pulse: 102 89 94   Temp: 98.6 F (37 C) 97.9 F (36.6 C)    TempSrc: Oral Oral    Resp: Height:      Weight: 134.3 kg (296 lb 1.2 oz)     SpO2: 98% 94%     Exam: Alert, audible wheezing, not dyspneic No jvd Chest diffuse mild exp wheezing, no rales RRR no MRG Abd distended, firm, nontender, +BS, no rebound GU foley draining amber urine, clear 2+ bilat LE pitting edema up to the hips Neuro no asterixis, ox 3  Urine sediment (undersigned) > numerous granular casts, 3-5 wbc per hpf, 2-4 rbc per hpf, no cellular casts UA > 1.025, >300 prot, >1000 glu, 3-6 wbc, 3-6 rbc CXR > left base atx otherwise wnl CT abd > lower chest clear, liver/ GB/ panc/ spleen are normal. Horseshoe kidney noted w mild/mod perinephric stranding and small amt of fluid within the air renal spaces extending into the retroperitoneum into the pelvis. No hydro. No ascites. UNa 23 UCreat 103  FeNa = 1.0   Assessment: 1. AKI w CKD 3 and neph syndrome underlying - suspect ATN, uncertain cause but can be seen with severe NS.  Renal veins patent by doppler yest.  Supportive care for now, have ordered a few serologies. Stopping BP meds, want to keep BP up. Would like to diurese but will get another CXR first and await today's labs which are pending.  2. Hx "DVT" on xarelto.  Question this diagnosis, have d/w primary MD who is looking into the documentation for deep vein thrombosis.  3. 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 Had open renal bx in July at [facility] but no cortical tissue reportedly.  4. Volume - looks vol  overloaded, considering IV lasix. Start renal diet w fluid restrict 5. Wheezing - last cxr was normal, repeat today, wt's are up , get standing wt 6. DM hx retinopathy  Plan - as above    [name] MD  pager [phone]    cell [phone]  09/24/2014, 12:06 PM     Recent Labs Lab 09/21/14 1035 09/22/14 0405 09/23/14 0530  NA 137 137 130*  K 4.0 4.5 3.6  CL 103 105 96*  CO2 GLUCOSE 313* 155* 141*  BUN 33* 42* 53*  CREATININE 2.15* 3.24* 5.90*  CALCIUM 8.8* 8.0* 8.1*    Recent Labs Lab 09/21/14 1035  AST 33  ALT 29  ALKPHOS 85  BILITOT 0.8  PROT 6.2*  ALBUMIN 2.7*    Recent Labs Lab 09/22/14 0405 09/23/14 0530 09/24/14 0900  WBC 8.5 8.7 7.2  HGB 12.9* 13.5 12.4*  HCT 37.6* 40.2 36.9*  MCV 84.9 86.6 85.6  PLT 283 297 271   . citalopram  20 mg Oral Daily  . cyclobenzaprine  10 mg Oral QHS  . gabapentin  600 mg Oral Daily  . insulin aspart  0-5 Units Subcutaneous QHS  . insulin aspart  0-9 Units Subcutaneous TID WC  . insulin glargine  15  Units Subcutaneous BID  . [START ON 09/25/2014] levothyroxine  75 mcg Oral QAC breakfast   . heparin 1,800 Units/hr (09/24/14 4098)   acetaminophen, albuterol, ALPRAZolam, hydrALAZINE, HYDROcodone-acetaminophen, HYDROmorphone (DILAUDID) injection, ondansetron **OR** ondansetron (ZOFRAN) IV, promethazine

## 2014-09-24 NOTE — Clinical Documentation Improvement (Signed)
Hospitalist  Based on the clinical findings below, please document any associated diagnoses/conditions the patient has or may have.   Diabetic Gastroparesis  Gerd  Gastritis  Other  Clinically Undetermined   Supporting Information:  Principal Problem:  Intractable nausea and vomiting per 9/22 progress notes.   Please exercise your independent, professional judgment when responding. A specific answer is not anticipated or expected.   Thank Sabino Donovan Health Information Management [place] [phone]

## 2014-09-24 NOTE — Progress Notes (Signed)
ANTICOAGULATION CONSULT NOTE - Follow Up Consult  Pharmacy Consult for Heparin Indication: Hx DVT and r/o renal vein thrombosis  Allergies  Allergen Reactions  . Ibuprofen Other (See Comments)    MD told him not to take due to kidney disease.    Patient Measurements: Height:  (188 cm) Weight: 296 lb 1.2 oz (134.3 kg) IBW/kg (Calculated) : 82.2 Heparin Dosing Weight: 112 kg  Vital Signs: Temp: 97.9 F (36.6 C) (09/22 0439) Temp Source: Oral (09/22 0439) BP: 116/49 mmHg (09/22 0929) Pulse Rate: 89 (09/22 0439)  Labs:  Recent Labs  09/21/14 1035 09/22/14 0405 09/23/14 0530 09/23/14 1456 09/24/14 0900  HGB 15.7 12.9* 13.5  --  12.4*  HCT 45.4 37.6* 40.2  --  36.9*  PLT 293 283 297  --  271  APTT  --   --   --  37 71*  LABPROT  --   --   --  17.5*  --   INR  --   --   --  1.43  --   HEPARINUNFRC  --   --   --  2.16*  --   CREATININE 2.15* 3.24* 5.90*  --   --     Estimated Creatinine Clearance: 23.5 mL/min (by C-G formula based on Cr of 5.9).   Medications:  Heparin @ 1800 units/hr (18 ml/hr)  Assessment: 13 YOM with PMH HTN, HLD, DM2, on Xarelto PTA for history of DVT in January 2016 in the setting of nephrotic syndrome admitted with intractable nausea, vomiting with left flank area pain, hypertensive urgency, and AKI. Xarelto was held and the patient was switched to IV Heparin on 9/22 for continued anticoagulation for hx DVT and concern for renal vein thrombosis. Last dose of Xarelto 20 mg was 9/20 at ~1700.  The patient is being monitored with aPTTs for now since heparin levels remain elevated from Xarelto. Anticipate slow clearance of Xarelto with AKI. Will wait to recheck a HL to determine correlation on 9/23 AM  aPTT is therapeutic this morning (aPTT 71, goal of 66-102). CBC stable, no overt s/sx of bleeding noted. Will recheck aPTT in 8 hours.   Goal of Therapy:  Heparin level 0.3-0.7 units/ml aPTT 66-102 seconds Monitor platelets by anticoagulation  protocol: Yes   Plan:  1. Continue Heparin at 1800 units/hr (18 ml/hr) 2. Will continue to monitor for any signs/symptoms of bleeding and will follow up with aPTT in 8 hours to confirm therapeutic  3. Will follow-up long term plans and transition back to oral anticoagulation   Georgina Pillion, PharmD, BCPS Clinical Pharmacist Pager: [phone] 09/24/2014 10:21 AM

## 2014-09-24 NOTE — Progress Notes (Signed)
PROGRESS NOTE  Russell [name] ZOX:[id number] DOB: [birth year] DOA: 09/21/2014 PCP: [name], MD  HPI: Pt is 44 yo male with known HTN, HLD, DM type II with complications of neuropathy and retinopathy, obesity presented to [facility] ED with main concern of several days duration of progressive nausea and non bloody vomiting, poor oral intake, unable to take any medications due to his symptoms. He denies fevers, chills, recent sick contacts or exposures, no specific abdominal pain, no urinary concerns. Pt also denies chest pain or shortness of breath. In ED, pt noted to be uncomfortable due to vomiting. Blood work notable for Cr 2.15 (was WNL 4 months ago). TRH asked to admit for further evaluation. SDU requested due to BP 215/115  Subjective / 24 H Interval events  - DOE, on room air at rest, does has wheezing, persistent lower extremity edema, oliguria, mother in room. -did not report pain to me, but pr RN patient has been requesting prn dilaudid q2hrs  Assessment/Plan: Principal Problem:   Intractable nausea and vomiting Active Problems:   DVT (deep venous thrombosis), H/o 01/2014-on Xarelto   Hypothyroidism (acquired)   Uncontrolled diabetes mellitus with retinopathy   Hypertensive urgency   Acute renal failure   Diabetic neuropathy   Uncontrolled diabetes mellitus type 2 with peripheral artery disease   Morbid obesity   Nausea with vomiting   Acute on chronic renal failure   Acute on chronic renal failure - was initially admitted to [facility] long, due to continue worsening of creatinine , patient was transferred to [place] on 9/21 -etiology not entirely clear, he is now becoming oliguric with only ~150 cc UOP today, and his Creatinine continue to increase. -  patient reportedly had a renal vein clotting on renal artery [place] at [facility] in July and also had non specific bilateral perinephric stranding - start heparin gtt now cannot use NOAC given renal failure,  -renal artery duplex  done on 9/21: no renal artery stenosis, patent bilateral renal veins.  - consulted nephrology, discussed with Dr. Arlean Hopping  Intractable nausea and vomiting with left flank area pain  - unclear etiology at this time and possibly related to #1, improving, able to eat better - CT abd and pelvis wo contrast suggestive of renal inflammation, ? Pyelonephritis  - placed on Rocephin 9/20 but somewhat strange as pt with no fever and normal WBC, will discontinue as it is unlikely to be infectious - continue to provide analgesia as needed - no complaint of n/v and flank pain on 9/22  Hypertensive urgency - likely exacerbated by N/V and poor oral intake - currently on Metoprolol 100 mg PO ER as per home regimen, Hydralazine 25 mg PO TID and as needed  - stopped Lisinopril due to ARF, also stopped Torsemide - BP is stable and at target range on 9/22, metoprolol and hydralazine stopped to leave room for titration of diuretics per nephrology recommendations.   Mixed restrictive/obstructive pulmonary defect (per outpatient PFT done at [facility]) - stable, albuterol as needed - he has some upper airway wheezing (? VCD) -duoneb  Hyponatremia - likely in the setting of fluid overload, monitor  Uncontrolled diabetes mellitus type 2 with peripheral artery disease - patient reported taking Lantus 30units qhs, for now likely will need decrease insulin dose in the setting of worsening renal function, will close monitor - place on SSI - continue to hold Metformin  -a1c 10.3  Obesity - Body mass index is 33.92 kg/(m^2).  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 He was evaluated by HemeOnc at [facility] in July with recommendations that [facility] is continued given extension of his superficial clot. - start heparin gtt,  -Renal v duplex (07/10/14 from [facility]):  Negative for renal vein thromboses or renal artery stenosis bilaterally -renal duplex on 9/21 ([place]) again no renal artery stenosis, bilateral patent veins. -no imaging reports to confirm DVT diagnosis so far( I have talked to patient's primary physician's office today and review his outside record)  -continue heparin drip for now, awaiting records from [facility], patient reported he was diagnosed with DVT there, though, i donot think this is reliable.  Uncontrolled diabetes mellitus with retinopathy - outpatient follow up - d/w patient proper glucose monitoring and control of CBG's  Diabetic neuropathy - continue Gabapentin , reduce dose to 100 mg po qhs in the setting of worsening renal function  Hypothyroidism (acquired) - continue Synthroid    "Discharge summary from 07/2017 ([facility]) Acute on chronic renal failure, nephrotic syndrome Patient has a history of nephrotic syndrome likely secondary to nephropathy from poorly controlled DM II. Patient was agreeable to biopsy but horseshoe kidney prevented percutaneous approach. Urology agreed to biopsy the patient's kidney and performed this on 07/20/2014. The patient tolerated the procedure well and anti-coagulation is intended to be held x 48-72 hours. Initial creatinine was 1.54; creatinine on discharge was 2.09. Patient will follow up with Nephrology.   Hypercoagulability, etiology unknown Patient has a history of superficial thrombophlebitis with progression of clot burden despite anti-coagulation with Xarelto. Approximately 3 weeks ago the patient discontinued his anti-coagulation (he states at the direction of a physician, although this is unclear from outside records). Hem/Onc was consulted to advise on hypercoagulability and anti-coagulation. They performed a hypercoagulability work up. Results: free protein S ag 94, protein C activity 123, plasminogen 94, antithrombin III 74, factor V absent,  factor II 20210 mutation absent, cardiolipin igg ab <14, igm <12, iga <11, beta-2-glyco igg/igm/iga <9, lupus inhibitor not detected. Patient was bridged on heparin drip while awaiting biopsy. Hem/Onc recommended continuing anti-coagulation at discharge.  Mixed restrictive/obstructive pulmonary defect Patient noted to be wheezing with subjective dyspnea during his inpatient stay. Pulm was consulted and obtained PFTs. Results demonstrated mixed restrictive/obstructive pulmonary defect. Recommended initiating symbicort. Patient will have follow up with Pulm on an outpatient basis.  DM II w/ retinopathy and neuropathy Metformin was held during the patient's inpatient stay. Blood sugars were often >200-300 despite uptitration of lantus to as high as 55 units daily. Patient will be continued on renal dose adjusted metformin on discharge and an increased dose of lantus as noted on med rec below. Endocrine follow up will be scheduled via [employer] referral. "   Diet: Diet renal/carb modified with fluid restriction Diet-HS Snack?: Nothing; Room service appropriate?: Yes; Fluid consistency:: Thin Fluids: none  DVT Prophylaxis: heparin gtt  Code Status: Full Code Family Communication: d/w wife bedside  Disposition Plan: transfer to [facility]  Consultants:  Nephrology  Procedures:  None   Antibiotics Ceftriaxone 9/20 >> 9/21   Studies  US Renal  09/23/2014   CLINICAL DATA:  Acute renal failure.  EXAM: RENAL / URINARY TRACT ULTRASOUND COMPLETE  COMPARISON:  CT 09/21/2014.  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 No focal mass. No hydronephrosis. Echotexture normal.  Left Kidney:  Horseshoe kidney.  Left portion measures 12.9 cm. No focal mass. No hydronephrosis. Echotexture normal .  Bladder:  Bladder is nondistended.  IMPRESSION: Horseshoe kidney. Renal normal. No hydronephrosis. No bladder distention. No acute abnormality.   Electronically Signed   By: Maisie Fus   Register   On: 09/23/2014 07:10   Dg Chest Port 1 View  09/24/2014   CLINICAL DATA:  Acute shortness of breath and chest pain for 2 days.  EXAM: PORTABLE CHEST 1 VIEW  COMPARISON:  09/22/2014 and prior chest radiographs  FINDINGS: The cardiomediastinal silhouette is unremarkable.  Minimal bibasilar atelectasis noted.  There is no evidence of focal airspace disease, pulmonary edema, suspicious pulmonary nodule/mass, pleural effusion, or pneumothorax.  No acute bony abnormalities are identified.  IMPRESSION: Minimal bibasilar atelectasis without other significant abnormality.   Electronically Signed   By: Harmon Pier M.D.   On: 09/24/2014 13:06   Dg Abd Acute W/chest  09/22/2014   CLINICAL DATA:  Abdominal tension, nausea, vomiting and RIGHT upper quadrant abdominal pain extending across abdomen, hypertension, diabetes mellitus, nephrotic syndrome  EXAM: DG ABDOMEN ACUTE W/ 1V CHEST  COMPARISON:  Chest radiograph 05/26/2014, CT abdomen pelvis 09/20/2014  FINDINGS: Borderline enlargement of cardiac silhouette.  Mediastinal contours and pulmonary vascularity normal.  Minimal LEFT basilar atelectasis.  Prominent RIGHT first costochondral junction unchanged.  No infiltrate, pleural effusion or pneumothorax.  Retained contrast in colon.  Nonobstructive bowel gas pattern.  No bowel dilatation or bowel wall thickening or free air.  Gas in rectum.  No urinary tract calcification or acute osseous findings.  IMPRESSION: LEFT basilar atelectasis.  Nonobstructive bowel gas pattern.   Electronically Signed   By: Ulyses Southward M.D.   On: 09/22/2014 22:30    Objective  Filed Vitals:   09/24/14 0439 09/24/14 0845 09/24/14 0929 09/24/14 1200  BP: 106/79  116/49   Pulse: 89 94    Temp: 97.9 F (36.6 C)     TempSrc: Oral     Resp: 20 22    Height:      Weight:    293 lb 4.8 oz (133.04 kg)  SpO2: 94%       Intake/Output Summary (Last 24 hours) at 09/24/14 1457 Last data filed at 09/24/14 0600  Gross per 24 hour    Intake  806.7 ml  Output    264 ml  Net  542.7 ml   Filed Weights   09/23/14 0522 09/23/14 2048 09/24/14 1200  Weight: 271 lb 6.2 oz (123.1 kg) 296 lb 1.2 oz (134.3 kg) 293 lb 4.8 oz (133.04 kg)    Exam:  GENERAL: NAD  HEENT: head NCAT, no scleral icterus. Pupils round and reactive. Mucous membranes are moist. Posterior pharynx clear of any exudate or lesions.  NECK: Supple. No carotid bruits. No lymphadenopathy or thyromegaly.  LUNGS: Clear to auscultation. No wheezing or crackles  HEART: Regular rate and rhythm without murmur. 2+ pulses, no JVD, no peripheral edema  ABDOMEN: Soft, nontender, and nondistended. Positive bowel sounds. No hepatosplenomegaly was noted.  EXTREMITIES: Without any cyanosis, clubbing, rash, lesions or edema.  NEUROLOGIC: Alert and oriented x3. Cranial nerves II through XII are grossly intact. Strength 5/5 in all 4.  PSYCHIATRIC: Normal mood and affect  SKIN: No ulceration or induration present.  Data Reviewed: Basic Metabolic Panel:  Recent Labs Lab 09/21/14 1035 09/22/14 0405 09/23/14 0530 09/24/14 1347  NA 137 137 130* 134*  K 4.0 4.5 3.6 4.4  CL 103 105 96* 100*  CO2 GLUCOSE  313* 155* 141* 165*  BUN 33* 42* 53* 74*  CREATININE 2.15* 3.24* 5.90* 6.41*  CALCIUM 8.8* 8.0* 8.1* 8.1*   Liver Function Tests:  Recent Labs Lab 09/21/14 1035  AST 33  ALT 29  ALKPHOS 85  BILITOT 0.8  PROT 6.2*  ALBUMIN 2.7*    Recent Labs Lab 09/21/14 1035  LIPASE 17*   CBC:  Recent Labs Lab 09/21/14 1035 09/22/14 0405 09/23/14 0530 09/24/14 0900  WBC 9.2 8.5 8.7 7.2  HGB 15.7 12.9* 13.5 12.4*  HCT 45.4 37.6* 40.2 36.9*  MCV 84.2 84.9 86.6 85.6  PLT 293 283 297 271   CBG:  Recent Labs Lab 09/23/14 1639 09/23/14 2000 09/23/14 2100 09/24/14 0824 09/24/14 1105  GLUCAP 311* 212* 177* 200* 222*    Recent Results (from the past 240 hour(s))  MRSA PCR Screening     Status: None   Collection Time: 09/21/14  4:44  PM  Result Value Ref Range Status   MRSA by PCR NEGATIVE NEGATIVE Final    Comment:        The GeneXpert MRSA Assay (FDA approved for NASAL specimens only), is one component of a comprehensive MRSA colonization surveillance program. It is not intended to diagnose MRSA infection nor to guide or monitor treatment for MRSA infections.      Scheduled Meds: . citalopram  20 mg Oral Daily  . cyclobenzaprine  10 mg Oral QHS  . gabapentin  600 mg Oral Daily  . insulin aspart  0-5 Units Subcutaneous QHS  . insulin aspart  0-9 Units Subcutaneous TID WC  . insulin glargine  15 Units Subcutaneous BID  . ipratropium-albuterol  3 mL Nebulization Q6H  . [START ON 09/25/2014] levothyroxine  75 mcg Oral QAC breakfast   Continuous Infusions: . heparin 1,800 Units/hr (09/24/14 1610)   Time spent: 60 minutes, more than 50% bedside discussing with patient and his family, discussing with Nephrology and patient's primary care physician's office regarding past diagnosis of DVT.  Albertine Grates, MD PhD [facility] Pager [phone]. If 7 PM - 7 AM, please contact night-coverage at [email or web address], password [facility] 09/24/2014, 2:57 PM  LOS: 3 days

## 2014-09-25 ENCOUNTER — Inpatient Hospital Stay (HOSPITAL_COMMUNITY): Payer: Managed Care, Other (non HMO)

## 2014-09-25 LAB — BASIC METABOLIC PANEL
ANION GAP: 10 (ref 5–15)
BUN: 78 mg/dL — ABNORMAL HIGH (ref 6–20)
CALCIUM: 7.7 mg/dL — AB (ref 8.9–10.3)
CHLORIDE: 99 mmol/L — AB (ref 101–111)
CO2: 20 mmol/L — AB (ref 22–32)
Creatinine, Ser: 6.38 mg/dL — ABNORMAL HIGH (ref 0.61–1.24)
GFR calc non Af Amer: 10 mL/min — ABNORMAL LOW (ref >60)
GFR, EST AFRICAN AMERICAN: 11 mL/min — AB (ref >60)
Glucose, Bld: 199 mg/dL — ABNORMAL HIGH (ref 65–99)
Potassium: 4 mmol/L (ref 3.5–5.1)
SODIUM: 129 mmol/L — AB (ref 135–145)

## 2014-09-25 LAB — CBC
HEMATOCRIT: 33.1 % — AB (ref 39.0–52.0)
HEMOGLOBIN: 11.5 g/dL — AB (ref 13.0–17.0)
MCH: 29.8 pg (ref 26.0–34.0)
MCHC: 34.7 g/dL (ref 30.0–36.0)
MCV: 85.8 fL (ref 78.0–100.0)
Platelets: 243 10*3/uL (ref 150–400)
RBC: 3.86 MIL/uL — ABNORMAL LOW (ref 4.22–5.81)
RDW: 13.6 % (ref 11.5–15.5)
WBC: 7.7 10*3/uL (ref 4.0–10.5)

## 2014-09-25 LAB — ANTINUCLEAR ANTIBODIES, IFA: ANTINUCLEAR ANTIBODIES, IFA: NEGATIVE

## 2014-09-25 LAB — GLUCOSE, CAPILLARY
GLUCOSE-CAPILLARY: 144 mg/dL — AB (ref 65–99)
GLUCOSE-CAPILLARY: 186 mg/dL — AB (ref 65–99)
GLUCOSE-CAPILLARY: 204 mg/dL — AB (ref 65–99)
GLUCOSE-CAPILLARY: 181 mg/dL — AB (ref 65–99)
GLUCOSE-CAPILLARY: 222 mg/dL — AB (ref 65–99)

## 2014-09-25 LAB — HEPATITIS PANEL, ACUTE
HCV Ab: <0.1 s/co ratio (ref 0.0–0.9)
Hep A IgM: NEGATIVE
Hep B C IgM: NEGATIVE
Hepatitis B Surface Ag: NEGATIVE

## 2014-09-25 LAB — C3 COMPLEMENT: C3 COMPLEMENT: 119 mg/dL (ref 82–167)

## 2014-09-25 LAB — HEPARIN LEVEL (UNFRACTIONATED): HEPARIN UNFRACTIONATED: 0.6 [IU]/mL (ref 0.30–0.70)

## 2014-09-25 LAB — CK: Total CK: 1949 U/L — ABNORMAL HIGH (ref 49–397)

## 2014-09-25 LAB — GLOMERULAR BASEMENT MEMBRANE ANTIBODIES: GBM AB: 3 U (ref 0–20)

## 2014-09-25 LAB — APTT: aPTT: 79 seconds — ABNORMAL HIGH (ref 24–37)

## 2014-09-25 LAB — MPO/PR-3 (ANCA) ANTIBODIES: Myeloperoxidase Abs: <9 U/mL (ref 0.0–9.0)

## 2014-09-25 LAB — C4 COMPLEMENT: Complement C4, Body Fluid: 27 mg/dL (ref 14–44)

## 2014-09-25 MED ORDER — METOPROLOL TARTRATE 1 MG/ML IV SOLN
2.5000 mg | Freq: Three times a day (TID) | INTRAVENOUS | Status: DC | PRN
  Administered 2014-09-25: 2.5 mg via INTRAVENOUS
  Filled 2014-09-25: qty 5

## 2014-09-25 MED ORDER — METOPROLOL TARTRATE 12.5 MG HALF TABLET: 12.5000 mg | ORAL_TABLET | Freq: Two times a day (BID) | ORAL | Status: DC

## 2014-09-25 MED ORDER — FUROSEMIDE 10 MG/ML IJ SOLN
80.0000 mg | Freq: Three times a day (TID) | INTRAMUSCULAR | Status: AC
  Administered 2014-09-25 (×2): 80 mg via INTRAVENOUS
  Filled 2014-09-25 (×2): qty 8

## 2014-09-25 MED ORDER — HYDROMORPHONE HCL 1 MG/ML IJ SOLN
0.5000 mg | INTRAMUSCULAR | Status: DC | PRN
  Administered 2014-09-25 – 2014-09-27 (×10): 0.5 mg via INTRAVENOUS
  Filled 2014-09-25 (×11): qty 1

## 2014-09-25 NOTE — Progress Notes (Signed)
ANTICOAGULATION CONSULT NOTE - Follow Up Consult  Pharmacy Consult for Heparin Indication: Hx DVT  Allergies  Allergen Reactions  . Ibuprofen Other (See Comments)    MD told him not to take due to kidney disease.    Patient Measurements: Height:  (188 cm) Weight: 295 lb 14.3 oz (134.216 kg) IBW/kg (Calculated) : 82.2 Heparin Dosing Weight: 112 kg  Vital Signs: Temp: 97.9 F (36.6 C) (09/23 0622) Temp Source: Oral (09/23 0622) BP: 127/86 mmHg (09/23 0622) Pulse Rate: 101 (09/23 0622)  Labs:  Recent Labs  09/23/14 0530  09/23/14 1456 09/24/14 0900 09/24/14 1347 09/24/14 1710 09/25/14 0510  HGB 13.5  --   --  12.4*  --   --  11.5*  HCT 40.2  --   --  36.9*  --   --  33.1*  PLT 297  --   --  271  --   --  243  APTT  --   < > 37 71*  --  80* 79*  LABPROT  --   --  17.5*  --   --   --   --   INR  --   --  1.43  --   --   --   --   HEPARINUNFRC  --   --  2.16*  --   --   --  0.60  CREATININE 5.90*  --   --   --  6.41*  --  6.38*  CKTOTAL  --   --   --   --   --   --  1949*  < > = values in this interval not displayed.  Estimated Creatinine Clearance: 21.7 mL/min (by C-G formula based on Cr of 6.38).   Medications:  Heparin @ 1800 units/hr (18 ml/hr)  Assessment: 38 YOM with PMH HTN, HLD, DM2, on Xarelto PTA for history of DVT in January 2016 in the setting of nephrotic syndrome admitted with intractable nausea, vomiting with left flank area pain, hypertensive urgency, and AKI. Xarelto was held and the patient was switched to IV Heparin on 9/22 for continued anticoagulation for hx DVT and concern for renal vein thrombosis. Last dose of Xarelto 20 mg was 9/20 at ~1700.  Imaging has ruled out renal vein thrombosis. MD attempting to get records regarding old DVT and recent visit to [facility] in July where extension of treatment was recommended to determine if long-term anticoagulation should be continued or is safe to d/c at this time.   aPTT and heparin level are both  therapeutic this morning (aPTT 79, goal of 66-102; HL 0.6, goal of 0.3-0.7). Hgb/Hct slight drop, plts wnl. aPTT and HL are starting to become correleated - will only recheck a HL in the AM.   Goal of Therapy:  Heparin level 0.3-0.7 units/ml aPTT 66-102 seconds Monitor platelets by anticoagulation protocol: Yes   Plan:  1. Continue Heparin at 1800 units/hr (18 ml/hr) 2. Will continue to monitor for any signs/symptoms of bleeding and will follow up with heparin level in the a.m.  3. Will follow-up long term plans and transition back to oral anticoagulation   Georgina Pillion, PharmD, BCPS Clinical Pharmacist Pager: [phone] 09/25/2014 8:04 AM

## 2014-09-25 NOTE — Progress Notes (Signed)
PROGRESS NOTE  Russell [name] ZOX:[id number] DOB: 06/10/1970 DOA: 09/21/2014 PCP: [name], MD  HPI: Pt is 44 yo male with known HTN, HLD, DM type II with complications of neuropathy and retinopathy, obesity presented to [facility] ED with main concern of several days duration of progressive nausea and non bloody vomiting, poor oral intake, unable to take any medications due to his symptoms. He denies fevers, chills, recent sick contacts or exposures, no specific abdominal pain, no urinary concerns. Pt also denies chest pain or shortness of breath. In ED, pt noted to be uncomfortable due to vomiting. Blood work notable for Cr 2.15 (was WNL 4 months ago). TRH asked to admit for further evaluation. SDU requested due to BP 215/115  Subjective / 24 H Interval events  - feeling better, reported making more urine, less edema, less wheezing, -did not report pain to me, but pr RN patient has been requesting prn dilaudid q2hrs  Assessment/Plan: Principal Problem:   Intractable nausea and vomiting Active Problems:   DVT (deep venous thrombosis), H/o 01/2014-on Xarelto   Hypothyroidism (acquired)   Uncontrolled diabetes mellitus with retinopathy   Hypertensive urgency   Acute renal failure   Diabetic neuropathy   Uncontrolled diabetes mellitus type 2 with peripheral artery disease   Morbid obesity   Nausea with vomiting   Acute on chronic renal failure   Acute on chronic renal failure - was initially admitted to [facility] long, due to continue worsening of creatinine , patient was transferred to  on 9/21 -etiology not entirely clear, he is now becoming oliguric with only ~150 cc UOP today, and his Creatinine continue to increase. -  patient reportedly had a renal vein clotting on renal artery [place] at [facility] in July and also had non specific bilateral perinephric stranding - start heparin gtt now cannot use NOAC given renal failure,  -renal artery duplex done on 9/21: no renal artery  stenosis, patent bilateral renal veins.  - consulted nephrology, discussed with Dr. Arlean Hopping -slightly improving, continue lasix.  Intractable nausea and vomiting with left flank area pain  - unclear etiology at this time and possibly related to #1, improving, able to eat better - CT abd and pelvis wo contrast suggestive of renal inflammation, ? Pyelonephritis  - placed on Rocephin 9/20 but somewhat strange as pt with no fever and normal WBC, will discontinue as it is unlikely to be infectious - continue to provide analgesia as needed - no complaint of n/v and flank pain on 9/22  Hypertensive urgency - likely exacerbated by N/V and poor oral intake - currently on Metoprolol 100 mg PO ER as per home regimen, Hydralazine 25 mg PO TID and as needed  - stopped Lisinopril due to ARF, also stopped Torsemide - BP is stable and at target range on 9/22, metoprolol and hydralazine stopped to leave room for titration of diuretics per nephrology recommendations.   Mixed restrictive/obstructive pulmonary defect (per outpatient PFT done at [facility]) - stable, albuterol as needed - he has some upper airway wheezing (? VCD) -duoneb  Hyponatremia - likely in the setting of fluid overload, monitor  Uncontrolled diabetes mellitus type 2 with peripheral artery disease - patient reported taking Lantus 30units qhs, for now likely will need decrease insulin dose in the setting of worsening renal function, will close monitor - place on SSI - continue to hold Metformin  -a1c 10.3  Obesity - Body mass index is 33.92 kg/(m^2).  History of superficial venous thrombosis, H/o 01/2014  - Xarelto  now on hold due to worsening of renal function - he apparently was started on anticoagulation and at one point this was discontinued. He was evaluated by HemeOnc at [facility] in July with recommendations that [facility] is continued given extension of his superficial clot. - start heparin gtt,  -Renal v duplex (07/10/14 from  [facility]): Negative for renal vein thromboses or renal artery stenosis bilaterally -renal duplex on 9/21 ([place]) again no renal artery stenosis, bilateral patent veins. -no imaging reports to confirm DVT diagnosis so far( I have talked to patient's primary physician's office today and review his outside record)  -continue heparin drip for now, awaiting records from [facility], patient reported he was diagnosed with DVT there, though, i donot think this is reliable.  Uncontrolled diabetes mellitus with retinopathy - outpatient follow up - d/w patient proper glucose monitoring and control of CBG's  Diabetic neuropathy - continue Gabapentin , reduce dose to 100 mg po qhs in the setting of worsening renal function  Hypothyroidism (acquired) - continue Synthroid    "Discharge summary from 07/2017 ([facility]) Acute on chronic renal failure, nephrotic syndrome Patient has a history of nephrotic syndrome likely secondary to nephropathy from poorly controlled DM II. Patient was agreeable to biopsy but horseshoe kidney prevented percutaneous approach. Urology agreed to biopsy the patient's kidney and performed this on 07/20/2014. The patient tolerated the procedure well and anti-coagulation is intended to be held x 48-72 hours. Initial creatinine was 1.54; creatinine on discharge was 2.09. Patient will follow up with Nephrology.   Hypercoagulability, etiology unknown Patient has a history of superficial thrombophlebitis with progression of clot burden despite anti-coagulation with Xarelto. Approximately 3 weeks ago the patient discontinued his anti-coagulation (he states at the direction of a physician, although this is unclear from outside records). Hem/Onc was consulted to advise on hypercoagulability and anti-coagulation. They performed a hypercoagulability work up. Results: free protein S ag 94, protein C activity 123, plasminogen 94, antithrombin III 74, factor V  absent, factor [place] 20210 mutation absent, cardiolipin igg ab <14, igm <12, iga <11, beta-2-glyco igg/igm/iga <9, lupus inhibitor not detected. Patient was bridged on heparin drip while awaiting biopsy. Hem/Onc recommended continuing anti-coagulation at discharge.  Mixed restrictive/obstructive pulmonary defect Patient noted to be wheezing with subjective dyspnea during his inpatient stay. Pulm was consulted and obtained PFTs. Results demonstrated mixed restrictive/obstructive pulmonary defect. Recommended initiating symbicort. Patient will have follow up with Pulm on an outpatient basis.  DM II w/ retinopathy and neuropathy Metformin was held during the patient's inpatient stay. Blood sugars were often >200-300 despite uptitration of lantus to as high as 55 units daily. Patient will be continued on renal dose adjusted metformin on discharge and an increased dose of lantus as noted on med rec below. Endocrine follow up will be scheduled via [facility] referral. "   Diet: Diet renal/carb modified with fluid restriction Diet-HS Snack?: Nothing; Room service appropriate?: Yes; Fluid consistency:: Thin Fluids: none  DVT Prophylaxis: heparin gtt  Code Status: Full Code Family Communication: patient  Disposition Plan: pending  Consultants:  Nephrology  Procedures:  None   Antibiotics Ceftriaxone 9/20 >> 9/21   Studies  Mr Maxine Glenn Abdomen Wo Contrast  09/25/2014   CLINICAL DATA:  Diabetes, hypertension, obesity, acute renal failure. Evaluate for renal vascular disease. Nausea and vomiting.  EXAM: MRA ABDOMEN WITHOUT CONTRAST  TECHNIQUE: Angiographic images of the chest were obtained using MRA technique without intravenous contrast.  CONTRAST:  None.  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  Exam is limited because of motion artifact, body habitus, and lack of intravenous  contrast.  Within the limits of the study, aorta is patent and normal in caliber. No aneurysm or occlusive process. Celiac and SMA origins are patent.  An accessory left upper pole renal artery is small in caliber and patent.  Main renal arteries are visualized bilaterally and are patent without evidence of ostial or proximal stenosis.  The IVC and renal veins appear patent without evidence of occlusion or acute thrombus.  Diffuse body wall subcutaneous edema noted compatible with anasarca.  No gallstones or biliary dilatation. Negative for bowel obstruction.  IMPRESSION: Patent renal arteries including an accessory left upper pole renal artery. No evidence of significant renal vascular occlusive process.  Horseshoe kidney configuration  No evidence of acute renal vein or IVC thrombosis or occlusion.   Electronically Signed   By: Judie Petit.  Shick M.D.   On: 09/25/2014 14:01   Dg Chest Port 1 View  09/24/2014   CLINICAL DATA:  Acute shortness of breath and chest pain for 2 days.  EXAM: PORTABLE CHEST 1 VIEW  COMPARISON:  09/22/2014 and prior chest radiographs  FINDINGS: The cardiomediastinal silhouette is unremarkable.  Minimal bibasilar atelectasis noted.  There is no evidence of focal airspace disease, pulmonary edema, suspicious pulmonary nodule/mass, pleural effusion, or pneumothorax.  No acute bony abnormalities are identified.  IMPRESSION: Minimal bibasilar atelectasis without other significant abnormality.   Electronically Signed   By: Harmon Pier M.D.   On: 09/24/2014 13:06    Objective  Filed Vitals:   09/25/14 0817 09/25/14 1030 09/25/14 1722 09/25/14 1804  BP: 150/84 134/77 142/80 181/98  Pulse: 102 114 101 118  Temp: 97.9 F (36.6 C) 98.8 F (37.1 C) 98.2 F (36.8 C) 98.3 F (36.8 C)  TempSrc: Oral Oral Oral   Resp: 20  20   Height:      Weight:      SpO2: 96% 96% 98%     Intake/Output Summary (Last 24 hours) at 09/25/14 1819 Last data filed at 09/25/14 1724  Gross per 24 hour  Intake    1320 ml  Output   4350 ml  Net  -3030 ml   Filed Weights   09/23/14 2048 09/24/14 1200 09/24/14 2007  Weight: 296 lb 1.2 oz (134.3 kg) 293 lb 4.8 oz (133.04 kg) 295 lb 14.3 oz (134.216 kg)    Exam:  GENERAL: NAD  HEENT: head NCAT, no scleral icterus. Pupils round and reactive. Mucous membranes are moist. Posterior pharynx clear of any exudate or lesions.  NECK: Supple. No carotid bruits. No lymphadenopathy or thyromegaly.  LUNGS: Clear to auscultation. No wheezing or crackles  HEART: Regular rate and rhythm without murmur. 2+ pulses, no JVD, no peripheral edema  ABDOMEN: Soft, nontender, and nondistended. Positive bowel sounds. No hepatosplenomegaly was noted.  EXTREMITIES: Without any cyanosis, clubbing, rash, lesions or edema.  NEUROLOGIC: Alert and oriented x3. Cranial nerves II through XII are grossly intact. Strength 5/5 in all 4.  PSYCHIATRIC: Normal mood and affect  SKIN: No ulceration or induration present.  Data Reviewed: Basic Metabolic Panel:  Recent Labs Lab 09/21/14 1035 09/22/14 0405 09/23/14 0530 09/24/14 1347 09/25/14 0510  NA 137 137 130* 134* 129*  K 4.0 4.5 3.6 4.4 4.0  CL 103 105 96* 100* 99*  CO2 20*  GLUCOSE 313* 155*  141* 165* 199*  BUN 33* 42* 53* 74* 78*  CREATININE 2.15* 3.24* 5.90* 6.41* 6.38*  CALCIUM 8.8* 8.0* 8.1* 8.1* 7.7*   Liver Function Tests:  Recent Labs Lab 09/21/14 1035  AST 33  ALT 29  ALKPHOS 85  BILITOT 0.8  PROT 6.2*  ALBUMIN 2.7*    Recent Labs Lab 09/21/14 1035  LIPASE 17*   CBC:  Recent Labs Lab 09/21/14 1035 09/22/14 0405 09/23/14 0530 09/24/14 0900 09/25/14 0510  WBC 9.2 8.5 8.7 7.2 7.7  HGB 15.7 12.9* 13.5 12.4* 11.5*  HCT 45.4 37.6* 40.2 36.9* 33.1*  MCV 84.2 84.9 86.6 85.6 85.8  PLT 293 283 297 271 243   CBG:  Recent Labs Lab 09/24/14 1649 09/24/14 2133 09/25/14 0736 09/25/14 1347 09/25/14 1642  GLUCAP 144* 152* 186* 204* 181*    Recent Results (from the past  240 hour(s))  MRSA PCR Screening     Status: None   Collection Time: 09/21/14  4:44 PM  Result Value Ref Range Status   MRSA by PCR NEGATIVE NEGATIVE Final    Comment:        The GeneXpert MRSA Assay (FDA approved for NASAL specimens only), is one component of a comprehensive MRSA colonization surveillance program. It is not intended to diagnose MRSA infection nor to guide or monitor treatment for MRSA infections.      Scheduled Meds: . citalopram  20 mg Oral Daily  . cyclobenzaprine  10 mg Oral QHS  . furosemide  80 mg Intravenous 3 times per day  . gabapentin  100 mg Oral QHS  . insulin aspart  0-5 Units Subcutaneous QHS  . insulin aspart  0-9 Units Subcutaneous TID WC  . insulin glargine  15 Units Subcutaneous BID  . ipratropium-albuterol  3 mL Nebulization Q6H  . levothyroxine  75 mcg Oral QAC breakfast   Continuous Infusions: . heparin 1,800 Units/hr (09/24/14 2202)   Time spent: 35  minutes, more than 50% bedside discussing with patient and his family,  Albertine Grates, MD PhD [facility] Pager [phone]. If 7 PM - 7 AM, please contact night-coverage at [email or web address], password [facility] 09/25/2014, 6:19 PM  LOS: 4 days

## 2014-09-25 NOTE — Progress Notes (Signed)
[facility] Progress Note   Subjective: still wheezing, no sig SOB.  Vasc doppler US of renal veins yest showed patent veins bilat (verbal report from tech).  He is asking about IV lasix.  Creat pending today.  Foley bag with sig UOP 300-400 cc in bag this am.    Filed Vitals:   09/25/14 0403 09/25/14 0622 09/25/14 0759 09/25/14 0817  BP:  127/86  150/84  Pulse: 100 101  102  Temp:  97.9 F (36.6 C)  97.9 F (36.6 C)  TempSrc:  Oral  Oral  Resp: Height:      Weight:      SpO2:  96% 96% 96%   Exam: Alert, audible wheezing, not dyspneic No jvd Chest diffuse mild exp wheezing, no rales RRR no MRG Abd distended, firm, nontender, +BS, no rebound GU foley draining amber urine, clear 2+ bilat LE pitting edema up to the hips Neuro no asterixis, ox 3  Urine sediment (undersigned) > numerous granular casts, 3-5 wbc per hpf, 2-4 rbc per hpf, no cellular casts UA > 1.025, >300 prot, >1000 glu, 3-6 wbc, 3-6 rbc CXR > left base atx otherwise wnl CT abd > lower chest clear, liver/ GB/ panc/ spleen are normal. Horseshoe kidney noted w mild/mod perinephric stranding and small amt of fluid within the air renal spaces extending into the retroperitoneum into the pelvis. No hydro. No ascites. UNa 23 UCreat 103  FeNa = 1.0   Assessment: 1. AKI w known CKD3 / neph syndrome underlying - suspect ATN due to relative hypotension and severe NS.  Appears to be recovering. Holding BP meds to support renal perfusion, making urine, Cr down slightly today.  2. Hx "DVT" on xarelto.  No DVT yet confirmed, primary MD looking into this. On hep for now. 3. 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 Had open renal bx in July at [facility] but no cortical tissue reportedly.  4. Volume excess/ edema - give IV lasix again today. Let BP run high for now.  5. Wheezing - improved a bit today 6. DM hx retinopathy/ painful neuropathy  Plan - as above    [name] MD  pager  [phone]    cell [phone]  09/25/2014, 11:16 AM     Recent Labs Lab 09/23/14 0530 09/24/14 1347 09/25/14 0510  NA 130* 134* 129*  K 3.6 4.4 4.0  CL 96* 100* 99*  CO2 24 22 20*  GLUCOSE 141* 165* 199*  BUN 53* 74* 78*  CREATININE 5.90* 6.41* 6.38*  CALCIUM 8.1* 8.1* 7.7*    Recent Labs Lab 09/21/14 1035  AST 33  ALT 29  ALKPHOS 85  BILITOT 0.8  PROT 6.2*  ALBUMIN 2.7*    Recent Labs Lab 09/23/14 0530 09/24/14 0900 09/25/14 0510  WBC 8.7 7.2 7.7  HGB 13.5 12.4* 11.5*  HCT 40.2 36.9* 33.1*  MCV 86.6 85.6 85.8  PLT 297 271 243   . citalopram  20 mg Oral Daily  . cyclobenzaprine  10 mg Oral QHS  . gabapentin  100 mg Oral QHS  . insulin aspart  0-5 Units Subcutaneous QHS  . insulin aspart  0-9 Units Subcutaneous TID WC  . insulin glargine  15 Units Subcutaneous BID  . ipratropium-albuterol  3 mL Nebulization Q6H  . levothyroxine  75 mcg Oral QAC breakfast   . heparin 1,800 Units/hr (09/24/14 2202)   acetaminophen, albuterol, ALPRAZolam, hydrALAZINE, HYDROcodone-acetaminophen, HYDROmorphone (DILAUDID) injection, ondansetron **OR** ondansetron (  ZOFRAN) IV, promethazine

## 2014-09-26 LAB — CBC
HCT: 31.5 % — ABNORMAL LOW (ref 39.0–52.0)
Hemoglobin: 10.8 g/dL — ABNORMAL LOW (ref 13.0–17.0)
MCH: 29.5 pg (ref 26.0–34.0)
MCHC: 34.3 g/dL (ref 30.0–36.0)
MCV: 86.1 fL (ref 78.0–100.0)
Platelets: 255 10*3/uL (ref 150–400)
RBC: 3.66 MIL/uL — ABNORMAL LOW (ref 4.22–5.81)
RDW: 13.7 % (ref 11.5–15.5)
WBC: 6.6 10*3/uL (ref 4.0–10.5)

## 2014-09-26 LAB — BASIC METABOLIC PANEL
Anion gap: 12 (ref 5–15)
BUN: 80 mg/dL — AB (ref 6–20)
CHLORIDE: 99 mmol/L — AB (ref 101–111)
CO2: 20 mmol/L — ABNORMAL LOW (ref 22–32)
CREATININE: 5.27 mg/dL — AB (ref 0.61–1.24)
Calcium: 8.2 mg/dL — ABNORMAL LOW (ref 8.9–10.3)
GFR calc Af Amer: 14 mL/min — ABNORMAL LOW (ref >60)
GFR, EST NON AFRICAN AMERICAN: 12 mL/min — AB (ref >60)
GLUCOSE: 283 mg/dL — AB (ref 65–99)
Potassium: 4.6 mmol/L (ref 3.5–5.1)
SODIUM: 131 mmol/L — AB (ref 135–145)

## 2014-09-26 LAB — HEPARIN LEVEL (UNFRACTIONATED)
HEPARIN UNFRACTIONATED: 0.28 [IU]/mL — AB (ref 0.30–0.70)
Heparin Unfractionated: 0.41 IU/mL (ref 0.30–0.70)

## 2014-09-26 LAB — GLUCOSE, CAPILLARY
GLUCOSE-CAPILLARY: 269 mg/dL — AB (ref 65–99)
Glucose-Capillary: 241 mg/dL — ABNORMAL HIGH (ref 65–99)
Glucose-Capillary: 192 mg/dL — ABNORMAL HIGH (ref 65–99)
Glucose-Capillary: 138 mg/dL — ABNORMAL HIGH (ref 65–99)

## 2014-09-26 LAB — CK: CK TOTAL: 1606 U/L — AB (ref 49–397)

## 2014-09-26 MED ORDER — METOPROLOL TARTRATE 12.5 MG HALF TABLET
12.5000 mg | ORAL_TABLET | Freq: Two times a day (BID) | ORAL | Status: DC
  Administered 2014-09-26 – 2014-09-29 (×6): 12.5 mg via ORAL
  Filled 2014-09-26 (×6): qty 1

## 2014-09-26 MED ORDER — IPRATROPIUM-ALBUTEROL 0.5-2.5 (3) MG/3ML IN SOLN: 3.0000 mL | RESPIRATORY_TRACT | Status: DC | PRN

## 2014-09-26 MED ORDER — ACETAMINOPHEN 500 MG PO TABS: 500.0000 mg | ORAL_TABLET | Freq: Four times a day (QID) | ORAL | Status: DC | PRN

## 2014-09-26 MED ORDER — FUROSEMIDE 10 MG/ML IJ SOLN
80.0000 mg | Freq: Three times a day (TID) | INTRAMUSCULAR | Status: AC
  Administered 2014-09-26 – 2014-09-29 (×9): 80 mg via INTRAVENOUS
  Filled 2014-09-26 (×8): qty 8

## 2014-09-26 NOTE — Progress Notes (Addendum)
ANTICOAGULATION CONSULT NOTE - Follow Up Consult  Pharmacy Consult for Heparin Indication: Hx DVT  Allergies  Allergen Reactions  . Ibuprofen Other (See Comments)    MD told him not to take due to kidney disease.    Patient Measurements: Height:  (188 cm) Weight: (!) 304 lb 14.3 oz (138.3 kg) IBW/kg (Calculated) : 82.2 Heparin Dosing Weight: 112 kg  Vital Signs: Temp: 98.8 F (37.1 C) (09/24 0504) Temp Source: Oral (09/24 0504) BP: 140/83 mmHg (09/24 0504) Pulse Rate: 114 (09/24 0504)  Labs:  Recent Labs  09/23/14 1456  09/24/14 0900 09/24/14 1347 09/24/14 1710 09/25/14 0510 09/26/14 0500  HGB  --   < > 12.4*  --   --  11.5* 10.8*  HCT  --   --  36.9*  --   --  33.1* 31.5*  PLT  --   --  271  --   --  243 255  APTT 37  --  71*  --  80* 79*  --   LABPROT 17.5*  --   --   --   --   --   --   INR 1.43  --   --   --   --   --   --   HEPARINUNFRC 2.16*  --   --   --   --  0.60 0.28*  CREATININE  --   --   --  6.41*  --  6.38*  --   CKTOTAL  --   --   --   --   --  1949*  --   < > = values in this interval not displayed.  Estimated Creatinine Clearance: 22.1 mL/min (by C-G formula based on Cr of 6.38).   Medications:  Heparin @ 1800 units/hr (18 ml/hr)  Assessment: 67 YOM with PMH HTN, HLD, DM2, on Xarelto PTA for history of DVT in January 2016 in the setting of nephrotic syndrome admitted with intractable nausea, vomiting with left flank area pain, hypertensive urgency, and AKI. Xarelto was held and the patient was switched to IV Heparin on 9/22 for continued anticoagulation for hx DVT and concern for renal vein thrombosis. Last dose of Xarelto 20 mg was 9/20 at ~1700.  Imaging has ruled out renal vein thrombosis. MD attempting to get records regarding old DVT and recent visit to [facility] in July where extension of treatment was recommended to determine if long-term anticoagulation should be continued or is safe to d/c at this time.   Heparin level slightly  subtherapeutic this morning. Hgb down to 10.8, plt ok. No bleeding noted.  Goal of Therapy:  Heparin level 0.3-0.7 units/ml aPTT 66-102 seconds Monitor platelets by anticoagulation protocol: Yes   Plan:  1. Increase heparin to 1950 units/hr  2. Will f/u 8 hr heparin level  Christoper Fabian, PharmD, BCPS Clinical pharmacist, pager [phone] 09/26/2014 6:08 AM    _____________________________ Addendum:  Follow up HL within goal. Continue current rate and check level with am labs  Isaac Bliss, PharmD, [facility] Clinical Pharmacist Pager [phone] 09/26/2014 3:52 PM

## 2014-09-26 NOTE — Progress Notes (Addendum)
   [facility] Progress Note   Subjective: net negative 3.6 L yesterday. No new complaints, creat down to 5.27. BP's high  Filed Vitals:   09/26/14 0131 09/26/14 0504 09/26/14 0755 09/26/14 0951  BP:  140/83 169/110   Pulse:  114 118   Temp:  98.8 F (37.1 C) 98.4 F (36.9 C)   TempSrc:  Oral Oral   Resp:  20 19   Height:      Weight:      SpO2: 98% 95% 97% 97%   Exam: Alert, audible wheezing, not dyspneic No jvd Chest diffuse mild exp wheezing, no rales RRR no MRG Abd distended, firm, nontender, +BS, no rebound GU foley draining amber urine, clear 2+ bilat LE pitting edema up to the hips Neuro no asterixis, ox 3  Urine sediment (undersigned) > numerous granular casts, 3-5 wbc per hpf, 2-4 rbc per hpf, no cellular casts UA > 1.025, >300 prot, >1000 glu, 3-6 wbc, 3-6 rbc CXR > left base atx otherwise wnl CT abd > lower chest clear, liver/ GB/ panc/ spleen are normal. Horseshoe kidney noted w mild/mod perinephric stranding and small amt of fluid within the air renal spaces extending into the retroperitoneum into the pelvis. No hydro. No ascites. UNa 23 UCreat 103  FeNa = 1.0 ANA, ANCA, hep C neg, C3/C4 normal  Assessment: 1. AKI w known CKD3 / neph syndrome underlying - suspect ATN due to relative hypotension and severe NS.  Recovery phase. 2. Hx "DVT" on xarelto.  No DVT yet confirmed, primary MD looking into this. On hep for now. 3. CKD 3 / DM / neph syndrome/ horseshoe kidney - suspected diab nephropathy. Had open renal bx in July at [facility] but did not get tissue they needed to make a diagnosis  4. Volume excess/ edema - cont IV lasix, ^ 80 tid, needs help to get edema down for mobility 5. HTN - BP's up off meds, restrated on low dose MTP per primary. Keep SBP > 120  6. DM hx retinopathy/ neuropathy  Plan - as above    [name] MD  pager [phone]    cell [phone]  09/26/2014, 11:38 AM     Recent Labs Lab 09/24/14 1347 09/25/14 0510  09/26/14 0500  NA 134* 129* 131*  K 4.4 4.0 4.6  CL 100* 99* 99*  CO2 22 20* 20*  GLUCOSE 165* 199* 283*  BUN 74* 78* 80*  CREATININE 6.41* 6.38* 5.27*  CALCIUM 8.1* 7.7* 8.2*    Recent Labs Lab 09/21/14 1035  AST 33  ALT 29  ALKPHOS 85  BILITOT 0.8  PROT 6.2*  ALBUMIN 2.7*    Recent Labs Lab 09/24/14 0900 09/25/14 0510 09/26/14 0500  WBC 7.2 7.7 6.6  HGB 12.4* 11.5* 10.8*  HCT 36.9* 33.1* 31.5*  MCV 85.6 85.8 86.1  PLT 271 243 255   . citalopram  20 mg Oral Daily  . cyclobenzaprine  10 mg Oral QHS  . furosemide  80 mg Intravenous 3 times per day  . gabapentin  100 mg Oral QHS  . insulin aspart  0-5 Units Subcutaneous QHS  . insulin aspart  0-9 Units Subcutaneous TID WC  . insulin glargine  15 Units Subcutaneous BID  . ipratropium-albuterol  3 mL Nebulization Q6H  . levothyroxine  75 mcg Oral QAC breakfast   . heparin 1,950 Units/hr (09/26/14 0731)   acetaminophen, albuterol, ALPRAZolam, hydrALAZINE, HYDROcodone-acetaminophen, HYDROmorphone (DILAUDID) injection, metoprolol, ondansetron **OR** ondansetron (ZOFRAN) IV, promethazine

## 2014-09-26 NOTE — Progress Notes (Signed)
PROGRESS NOTE  CATARINO VOLD ZOX:[id number] DOB: [birth year] DOA: 09/21/2014 PCP: [name], MD  HPI: Pt is 44 yo male with known HTN, HLD, DM type II with complications of neuropathy and retinopathy, obesity presented to [facility] ED with main concern of several days duration of progressive nausea and non bloody vomiting, poor oral intake, unable to take any medications due to his symptoms. He denies fevers, chills, recent sick contacts or exposures, no specific abdominal pain, no urinary concerns. Pt also denies chest pain or shortness of breath. In ED, pt noted to be uncomfortable due to vomiting. Blood work notable for Cr 2.15 (was WNL 4 months ago). TR[facility] asked to admit for further evaluation. SDU requested due to BP 215/115  Subjective / 24 H Interval events  - good urine output, cr slowly coming down -bp elevated, sinus tachcardia  Assessment/Plan: Principal Problem:   Intractable nausea and vomiting Active Problems:   DVT (deep venous thrombosis), H/o 01/2014-on Xarelto   Hypothyroidism (acquired)   Uncontrolled diabetes mellitus with retinopathy   Hypertensive urgency   Acute renal failure   Diabetic neuropathy   Uncontrolled diabetes mellitus type 2 with peripheral artery disease   Morbid obesity   Nausea with vomiting   Acute on chronic renal failure   Acute on chronic renal failure - was initially admitted to [facility] long, due to continue worsening of creatinine , patient was transferred to [place] on 9/21 -etiology not entirely clear, he is now becoming oliguric with only ~150 cc UOP today, and his Creatinine continue to increase. - patient reportedly had a renal vein clotting on renal artery [place] at [facility] in July (but not able to locate the imaging report),  -CT ab here also had non specific bilateral perinephric stranding --renal artery duplex done on 9/21: no renal artery stenosis, patent bilateral renal veins.  -improving, continue lasix, appreciate nephrology  input.  Intractable nausea and vomiting with left flank area pain  - unclear etiology at this time and possibly related to #1, improving, able to eat better, likely related to elevated blood pressure, consider gastric emptying study if symptom returns when bp well controlled to r/o gastoparesis - CT abd and pelvis wo contrast suggestive of renal inflammation, ? Pyelonephritis  - placed on Rocephin 9/20 but pt with no fever and normal WBC, rocephin discontinued as it is unlikely to be infectious - continue to provide analgesia as needed - no complaint of n/v and flank pain on 9/22  Hypertensive urgency - likely exacerbated by N/V and poor oral intake - home meds Metoprolol 100 mg PO ER and Hydralazine 25 mg PO TID held for adequate diuresis - stopped Lisinopril due to ARF, also stopped Torsemide - bp and heart rate started to increase, start back low dose lopressor on 9/24.   Mixed restrictive/obstructive pulmonary defect (per outpatient PFT done at [facility]) - stable, albuterol as needed - he has some upper airway wheezing (? VCD) -duoneb  Hyponatremia - likely in the setting of fluid overload, monitor  Uncontrolled diabetes mellitus type 2 with peripheral artery disease - patient reported taking Lantus 30units qhs, for now likely will need decrease insulin dose in the setting of worsening renal function, will close monitor - place on SSI - continue to hold Metformin  -a1c 10.3  Obesity - Body mass index is 33.92 kg/(m^2).  History of superficial venous thrombosis, H/o 01/2014  - Xarelto now on hold due to worsening of renal function - he apparently was started on anticoagulation and  at one point this was discontinued. He was evaluated by HemeOnc at [facility] in July with recommendations that [facility] is continued given extension of his superficial clot. - start heparin gtt,  -Renal v duplex (07/10/14 from [facility]): Negative for renal vein thromboses or renal artery stenosis bilaterally -   heparin drip for now, awaiting records from [facility], patient reported he was diagnosed with DVT there, though, i donot think this is reliable.  Uncontrolled diabetes mellitus with retinopathy - outpatient follow up - d/w patient proper glucose monitoring and control of CBG's  Diabetic neuropathy - continue Gabapentin , reduce dose to 100 mg po qhs in the setting of worsening renal function  Hypothyroidism (acquired) - continue Synthroid    "Discharge summary from 07/2017 ([facility]) Acute on chronic renal failure, nephrotic syndrome Patient has a history of nephrotic syndrome likely secondary to nephropathy from poorly controlled DM II. Patient was agreeable to biopsy but horseshoe kidney prevented percutaneous approach. Urology agreed to biopsy the patient's kidney and performed this on 07/20/2014. The patient tolerated the procedure well and anti-coagulation is intended to be held x 48-72 hours. Initial creatinine was 1.54; creatinine on discharge was 2.09. Patient will follow up with Nephrology.   Hypercoagulability, etiology unknown Patient has a history of superficial thrombophlebitis with progression of clot burden despite anti-coagulation with Xarelto. Approximately 3 weeks ago the patient discontinued his anti-coagulation (he states at the direction of a physician, although this is unclear from outside records). Hem/Onc was consulted to advise on hypercoagulability and anti-coagulation. They performed a hypercoagulability work up. Results: free protein S ag 94, protein C activity 123, plasminogen 94, antithrombin III 74, factor V absent, factor [place] 20210 mutation absent, cardiolipin igg ab <14, igm <12, iga <11, beta-2-glyco igg/igm/iga <9, lupus inhibitor not detected. Patient was bridged on heparin drip while awaiting biopsy. Hem/Onc recommended continuing anti-coagulation at discharge.  Mixed restrictive/obstructive pulmonary defect Patient noted to  be wheezing with subjective dyspnea during his inpatient stay. Pulm was consulted and obtained PFTs. Results demonstrated mixed restrictive/obstructive pulmonary defect. Recommended initiating symbicort. Patient will have follow up with Pulm on an outpatient basis.  DM II w/ retinopathy and neuropathy Metformin was held during the patient's inpatient stay. Blood sugars were often >200-300 despite uptitration of lantus to as high as 55 units daily. Patient will be continued on renal dose adjusted metformin on discharge and an increased dose of lantus as noted on med rec below. Endocrine follow up will be scheduled via [employer] referral. "   Diet: Diet Carb Modified Fluid consistency:: Thin; Room service appropriate?: Yes Fluids: none  DVT Prophylaxis: heparin gtt  Code Status: Full Code Family Communication: patient  Disposition Plan: pending  Consultants:  Nephrology  Procedures:  None   Antibiotics Ceftriaxone 9/20 >> 9/21   Studies  Mr Maxine Glenn Abdomen Wo Contrast  09/25/2014   CLINICAL DATA:  Diabetes, hypertension, obesity, acute renal failure. Evaluate for renal vascular disease. Nausea and vomiting.  EXAM: MRA ABDOMEN WITHOUT CONTRAST  TECHNIQUE: Angiographic images of the chest were obtained using MRA technique without intravenous contrast.  CONTRAST:  None.  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  Exam is limited because of motion artifact, body habitus, and lack of intravenous contrast.  Within the limits of the study, aorta is patent and normal in caliber. No aneurysm or occlusive process. Celiac and SMA origins are patent.  An accessory left upper pole renal artery is small in caliber and patent.  Main renal arteries are visualized bilaterally and are patent without evidence of ostial or proximal stenosis.  The IVC and renal veins appear patent without evidence  of occlusion or acute thrombus.  Diffuse body wall subcutaneous edema noted compatible with anasarca.  No gallstones or biliary dilatation. Negative for bowel obstruction.  IMPRESSION: Patent renal arteries including an accessory left upper pole renal artery. No evidence of significant renal vascular occlusive process.  Horseshoe kidney configuration  No evidence of acute renal vein or IVC thrombosis or occlusion.   Electronically Signed   By: Judie Petit.  Shick M.D.   On: 09/25/2014 14:01    Objective  Filed Vitals:   09/26/14 0755 09/26/14 0951 09/26/14 1454 09/26/14 1530  BP: 169/110   131/89  Pulse: 118  117 127  Temp: 98.4 F (36.9 C)   98.6 F (37 C)  TempSrc: Oral   Oral  Resp: Height:      Weight:      SpO2: 97% 97% 97% 98%    Intake/Output Summary (Last 24 hours) at 09/26/14 1647 Last data filed at 09/26/14 0804  Gross per 24 hour  Intake    240 ml  Output   2850 ml  Net  -2610 ml   Filed Weights   09/24/14 1200 09/24/14 2007 09/25/14 2050  Weight: 293 lb 4.8 oz (133.04 kg) 295 lb 14.3 oz (134.216 kg) 304 lb 14.3 oz (138.3 kg)    Exam:  GENERAL: NAD  HEENT: head NCAT, no scleral icterus. Pupils round and reactive. Mucous membranes are moist. Posterior pharynx clear of any exudate or lesions.  NECK: Supple. No carotid bruits. No lymphadenopathy or thyromegaly.  LUNGS: Clear to auscultation. Less wheezing  HEART: Regular rate and rhythm without murmur. 2+ pulses, no JVD, no peripheral edema  ABDOMEN: Soft, nontender, and nondistended. Positive bowel sounds. No hepatosplenomegaly was noted.  EXTREMITIES: edema subsiding  NEUROLOGIC: Alert and oriented x3. Cranial nerves II through XII are grossly intact. Strength 5/5 in all 4.  PSYCHIATRIC: Normal mood and affect  SKIN: No ulceration or induration present.  Data Reviewed: Basic Metabolic Panel:  Recent Labs Lab 09/22/14 0405 09/23/14 0530 09/24/14 1347 09/25/14 0510 09/26/14 0500  NA 137 130*  134* 129* 131*  K 4.5 3.6 4.4 4.0 4.6  CL 105 96* 100* 99* 99*  CO2 20* 20*  GLUCOSE 155* 141* 165* 199* 283*  BUN 42* 53* 74* 78* 80*  CREATININE 3.24* 5.90* 6.41* 6.38* 5.27*  CALCIUM 8.0* 8.1* 8.1* 7.7* 8.2*   Liver Function Tests:  Recent Labs Lab 09/21/14 1035  AST 33  ALT 29  ALKPHOS 85  BILITOT 0.8  PROT 6.2*  ALBUMIN 2.7*    Recent Labs Lab 09/21/14 1035  LIPASE 17*   CBC:  Recent Labs Lab 09/22/14 0405 09/23/14 0530 09/24/14 0900 09/25/14 0510 09/26/14 0500  WBC 8.5 8.7 7.2 7.7 6.6  HGB 12.9* 13.5 12.4* 11.5* 10.8*  HCT 37.6* 40.2 36.9* 33.1* 31.5*  MCV 84.9 86.6 85.6 85.8 86.1  PLT 283 297 271 243 255   CBG:  Recent Labs Lab 09/25/14 1642 09/25/14 2207 09/26/14 0751 09/26/14 1144 09/26/14 1623  GLUCAP 181* 222* 241* 192* 269*    Recent Results (from the past 240 hour(s))  MRSA PCR Screening     Status: None   Collection Time: 09/21/14  4:44 PM  Result Value Ref Range Status  MRSA by PCR NEGATIVE NEGATIVE Final    Comment:        The GeneXpert MRSA Assay (FDA approved for NASAL specimens only), is one component of a comprehensive MRSA colonization surveillance program. It is not intended to diagnose MRSA infection nor to guide or monitor treatment for MRSA infections.      Scheduled Meds: . citalopram  20 mg Oral Daily  . cyclobenzaprine  10 mg Oral QHS  . furosemide  80 mg Intravenous 3 times per day  . gabapentin  100 mg Oral QHS  . insulin aspart  0-5 Units Subcutaneous QHS  . insulin aspart  0-9 Units Subcutaneous TID WC  . insulin glargine  15 Units Subcutaneous BID  . ipratropium-albuterol  3 mL Nebulization Q6H  . levothyroxine  75 mcg Oral QAC breakfast  . metoprolol tartrate  12.5 mg Oral BID   Continuous Infusions: . heparin 1,950 Units/hr (09/26/14 0731)   Time spent: 35  minutes, more than 50% bedside discussing with patient and his family,  Albertine Grates, MD PhD [facility] Pager [phone]. If 7  PM - 7 AM, please contact night-coverage at [email or web address], password [facility] 09/26/2014, 4:47 PM  LOS: 5 days

## 2014-09-27 DIAGNOSIS — Z794 Long term (current) use of insulin: Secondary | ICD-10-CM

## 2014-09-27 DIAGNOSIS — E119 Type 2 diabetes mellitus without complications: Secondary | ICD-10-CM

## 2014-09-27 LAB — CBC
HCT: 37.1 % — ABNORMAL LOW (ref 39.0–52.0)
Hemoglobin: 12.3 g/dL — ABNORMAL LOW (ref 13.0–17.0)
MCH: 29.2 pg (ref 26.0–34.0)
MCHC: 33.2 g/dL (ref 30.0–36.0)
MCV: 88.1 fL (ref 78.0–100.0)
PLATELETS: 309 10*3/uL (ref 150–400)
RBC: 4.21 MIL/uL — ABNORMAL LOW (ref 4.22–5.81)
RDW: 13.9 % (ref 11.5–15.5)
WBC: 6.7 10*3/uL (ref 4.0–10.5)

## 2014-09-27 LAB — GLUCOSE, CAPILLARY
GLUCOSE-CAPILLARY: 207 mg/dL — AB (ref 65–99)
Glucose-Capillary: 156 mg/dL — ABNORMAL HIGH (ref 65–99)
Glucose-Capillary: 121 mg/dL — ABNORMAL HIGH (ref 65–99)
Glucose-Capillary: 199 mg/dL — ABNORMAL HIGH (ref 65–99)

## 2014-09-27 LAB — BASIC METABOLIC PANEL
Anion gap: 10 (ref 5–15)
BUN: 69 mg/dL — AB (ref 6–20)
CALCIUM: 9.2 mg/dL (ref 8.9–10.3)
CO2: 26 mmol/L (ref 22–32)
CREATININE: 4.43 mg/dL — AB (ref 0.61–1.24)
Chloride: 101 mmol/L (ref 101–111)
GFR calc non Af Amer: 15 mL/min — ABNORMAL LOW (ref >60)
GFR, EST AFRICAN AMERICAN: 17 mL/min — AB (ref >60)
Glucose, Bld: 177 mg/dL — ABNORMAL HIGH (ref 65–99)
Potassium: 4.1 mmol/L (ref 3.5–5.1)
SODIUM: 137 mmol/L (ref 135–145)

## 2014-09-27 LAB — HEPARIN LEVEL (UNFRACTIONATED)
Heparin Unfractionated: 0.29 IU/mL — ABNORMAL LOW (ref 0.30–0.70)
Heparin Unfractionated: 0.33 IU/mL (ref 0.30–0.70)

## 2014-09-27 NOTE — Progress Notes (Signed)
[facility] Progress Note   Subjective: creat down 4.4 today, feeling better, walked in halls some. Wt's coming down.   Filed Vitals:   09/26/14 2130 09/27/14 0536 09/27/14 0750 09/27/14 1529  BP: 123/81 163/83 134/80 137/66  Pulse: 104 109 104 105  Temp: 98.8 F (37.1 C) 98.2 F (36.8 C) 98 F (36.7 C) 97.5 F (36.4 C)  TempSrc: Oral Oral Oral Oral  Resp: Height:   (1.88 m)    Weight:  134.7 kg (296 lb 15.4 oz)    SpO2: 95% 95% 97% 97%   Exam: Alert, audible wheezing, not dyspneic Facial edema gone No jvd Chest mostly clear RRR no MRG Abd distended, firm, nontender, +BS, no rebound GU foley draining amber urine, clear 2+ bilat LE pitting edema up to the hips Neuro no asterixis, ox 3  Urine sediment (undersigned) > numerous granular casts, 3-5 wbc per hpf, 2-4 rbc per hpf, no cellular casts UA > 1.025, >300 prot, >1000 glu, 3-6 wbc, 3-6 rbc CXR > left base atx otherwise wnl CT abd > lower chest clear, liver/ GB/ panc/ spleen are normal. Horseshoe kidney noted w mild/mod perinephric stranding and small amt of fluid within the air renal spaces extending into the retroperitoneum into the pelvis. No hydro. No ascites. UNa 23 UCreat 103  FeNa = 1.0 ANA, ANCA, hep C neg, C3/C4 normal  Assessment: 1. AKI w known CKD3 / neph syndrome underlying - suspect ATN due to relative hypotension and severe NS.  Continues to improve.  2. Hx "DVT" on xarelto.  No DVT yet confirmed, primary MD looking into this. On hep for now. 3. CKD 3 / DM / neph syndrome/ horseshoe kidney - suspected diab nephropathy. Had open renal bx in July at [facility] but did not get tissue they needed to make a diagnosis.  4. Volume excess/ edema - improving, still sig vol and BP stable 5. HTN - BP's up off meds, restrated on low dose MTP per primary. Keep SBP > 120  6. DM hx retinopathy/ neuropathy  Plan - cont IV lasix another 24-48 hrs, OOB/ mobilize    [name] MD  pager  [phone]    cell [phone]  09/27/2014, 5:22 PM     Recent Labs Lab 09/25/14 0510 09/26/14 0500 09/27/14 0512  NA 129* 131* 137  K 4.0 4.6 4.1  CL 99* 99* 101  CO2 20* 20* 26  GLUCOSE 199* 283* 177*  BUN 78* 80* 69*  CREATININE 6.38* 5.27* 4.43*  CALCIUM 7.7* 8.2* 9.2    Recent Labs Lab 09/21/14 1035  AST 33  ALT 29  ALKPHOS 85  BILITOT 0.8  PROT 6.2*  ALBUMIN 2.7*    Recent Labs Lab 09/25/14 0510 09/26/14 0500 09/27/14 0512  WBC 7.7 6.6 6.7  HGB 11.5* 10.8* 12.3*  HCT 33.1* 31.5* 37.1*  MCV 85.8 86.1 88.1  PLT 243 255 309   . citalopram  20 mg Oral Daily  . cyclobenzaprine  10 mg Oral QHS  . furosemide  80 mg Intravenous 3 times per day  . gabapentin  100 mg Oral QHS  . insulin aspart  0-5 Units Subcutaneous QHS  . insulin aspart  0-9 Units Subcutaneous TID WC  . insulin glargine  15 Units Subcutaneous BID  . levothyroxine  75 mcg Oral QAC breakfast  . metoprolol tartrate  12.5 mg Oral BID   . heparin 2,100 Units/hr (09/27/14 1454)   acetaminophen, ALPRAZolam, hydrALAZINE, HYDROcodone-acetaminophen, HYDROmorphone (DILAUDID)  injection, ipratropium-albuterol, metoprolol, ondansetron **OR** ondansetron (ZOFRAN) IV, promethazine

## 2014-09-27 NOTE — Progress Notes (Signed)
PROGRESS NOTE  [name]:[id number] DOB: Feb 06, 1970 DOA: 09/21/2014 PCP: Russell Sidle, MD  HPI: Pt is 44 yo male with known HTN, HLD, DM type II with complications of neuropathy and retinopathy, obesity presented to [name] with main concern of several days duration of progressive nausea and non bloody vomiting, poor oral intake, unable to take any medications due to his symptoms. He denies fevers, chills, recent sick contacts or exposures, no specific abdominal pain, no urinary concerns. Pt also denies chest pain or shortness of breath. In ED, pt noted to be uncomfortable due to vomiting. Blood work notable for Cr 2.15 (was WNL 4 months ago). [facility] asked to admit for further evaluation. SDU requested due to BP 215/115  Subjective / 24 H Interval events  - good urine output, cr slowly coming down - bp /heart rate better  Assessment/Plan: Principal Problem:   Intractable nausea and vomiting Active Problems:   DVT (deep venous thrombosis), H/o 01/2014-on Xarelto   Hypothyroidism (acquired)   Uncontrolled diabetes mellitus with retinopathy   Hypertensive urgency   Acute renal failure   Diabetic neuropathy   Uncontrolled diabetes mellitus type 2 with peripheral artery disease   Morbid obesity   Nausea with vomiting   Acute on chronic renal failure   Acute on chronic renal failure - was initially admitted to [name], due to continue worsening of creatinine , patient was transferred to [name] on 9/21 -etiology not entirely clear, he is now becoming oliguric with only ~150 cc UOP today, and his Creatinine continue to increase. - patient reportedly had a renal vein clotting on renal artery [place] at [name] in July (but not able to locate the imaging report),  -CT ab here also had non specific bilateral perinephric stranding --renal artery duplex done on 9/21: no renal artery stenosis, patent bilateral renal veins.  -improving, continue lasix, appreciate nephrology  input.  Intractable nausea and vomiting with left flank area pain  - unclear etiology at this time and possibly related to #1, improving, able to eat better, likely related to elevated blood pressure, consider gastric emptying study if symptom returns when bp well controlled to r/o gastoparesis - CT abd and pelvis wo contrast suggestive of renal inflammation, ? Pyelonephritis  - placed on Rocephin 9/20 but pt with no fever and normal WBC, rocephin discontinued as it is unlikely to be infectious - continue to provide analgesia as needed - no complaint of n/v and flank pain on 9/22  Hypertensive urgency - likely exacerbated by N/V and poor oral intake - home meds Metoprolol 100 mg PO ER and Hydralazine 25 mg PO TID held for adequate diuresis - stopped Lisinopril due to ARF, also stopped Torsemide - bp and heart rate started to increase, start back low dose lopressor on 9/24.   Mixed restrictive/obstructive pulmonary defect (per outpatient PFT done at [name]) - stable, albuterol as needed - he has some upper airway wheezing (? VCD) -duoneb, less wheezing  Hyponatremia - likely in the setting of fluid overload -normalized on 9/25  Uncontrolled diabetes mellitus type 2 with peripheral artery disease - patient reported taking Lantus 30units qhs, for now likely will need decrease insulin dose in the setting of worsening renal function, will close monitor - place on SSI - continue to hold Metformin  -a1c 10.3  Obesity - Body mass index is 33.92 kg/(m^2).  History of superficial venous thrombosis, H/o 01/2014  - Xarelto now on hold due to worsening of renal function - he apparently  was started on anticoagulation and at [name] this was discontinued. He was evaluated by [facility]c at [name] in July with recommendations that [name] is continued given extension of his superficial clot. - start heparin gtt,  -Renal v duplex (07/10/14 from [name]): Negative for renal vein thromboses or renal  artery stenosis bilaterally -  heparin drip for now, awaiting records from [name], patient reported he was diagnosed with DVT there, though, i donot think this is reliable. -still waiting for [name] record, will contact hematology Dr. Merlene [name] who saw patient in 06/2014 for anticoagulation issues, likely will d/c anticoagulation once patient become more mobile.  Uncontrolled diabetes mellitus with retinopathy - outpatient follow up - d/w patient proper glucose monitoring and control of CBG's  Diabetic neuropathy - continue Gabapentin , reduce dose to 100 mg po qhs in the setting of worsening renal function  Hypothyroidism (acquired) - continue Synthroid    "Discharge summary from 07/2017 ([name]) Acute on chronic renal failure, nephrotic syndrome Patient has a history of nephrotic syndrome likely secondary to nephropathy from poorly controlled DM II. Patient was agreeable to biopsy but horseshoe kidney prevented percutaneous approach. Urology agreed to biopsy the patient's kidney and performed this on 07/20/2014. The patient tolerated the procedure well and anti-coagulation is intended to be held x 48-72 hours. Initial creatinine was 1.54; creatinine on discharge was 2.09. Patient will follow up with Nephrology.   Hypercoagulability, etiology unknown Patient has a history of superficial thrombophlebitis with progression of clot burden despite anti-coagulation with Xarelto. Approximately 3 weeks ago the patient discontinued his anti-coagulation (he states at the direction of a physician, although this is unclear from outside records). Hem/Onc was consulted to advise on hypercoagulability and anti-coagulation. They performed a hypercoagulability work up. Results: free protein S ag 94, protein C activity 123, plasminogen 94, antithrombin III 74, factor V absent, factor II 20210 mutation absent, cardiolipin igg ab <14, igm <12, iga <11, beta-2-glyco  igg/igm/iga <9, lupus inhibitor not detected. Patient was bridged on heparin drip while awaiting biopsy. Hem/Onc recommended continuing anti-coagulation at discharge.  Mixed restrictive/obstructive pulmonary defect Patient noted to be wheezing with subjective dyspnea during his inpatient stay. Pulm was consulted and obtained PFTs. Results demonstrated mixed restrictive/obstructive pulmonary defect. Recommended initiating symbicort. Patient will have follow up with Pulm on an outpatient basis.  DM II w/ retinopathy and neuropathy Metformin was held during the patient's inpatient stay. Blood sugars were often >200-300 despite uptitration of lantus to as [facility] as 55 units daily. Patient will be continued on renal dose adjusted metformin on discharge and an increased dose of lantus as noted on med rec below. Endocrine follow up will be scheduled via [facility] referral. "   Diet: Diet Carb Modified Fluid consistency:: Thin; Room service appropriate?: Yes Fluids: none  DVT Prophylaxis: heparin gtt  Code Status: Full Code Family Communication: patient  Disposition Plan: home when medically stable  Consultants:  Nephrology  Procedures:  None   Antibiotics Ceftriaxone 9/20 >> 9/21   Studies  No results found.  Objective  Filed Vitals:   09/26/14 2130 09/27/14 0536 09/27/14 0750 09/27/14 1529  BP: 123/81 163/83 134/80 137/66  Pulse: 104 109 104 105  Temp: 98.8 F (37.1 C) 98.2 F (36.8 C) 98 F (36.7 C) 97.5 F (36.4 C)  TempSrc: Oral Oral Oral Oral  Resp: Height:   (1.88 m)    Weight:  296 lb 15.4 oz (134.7 kg)  SpO2: 95% 95% 97% 97%    Intake/Output Summary (Last 24 hours) at 09/27/14 1703 Last data filed at 09/27/14 1530  Gross per 24 hour  Intake  167.9 ml  Output   4875 ml  Net -4707.1 ml   Filed Weights   09/24/14 2007 09/25/14 2050 09/27/14 0536  Weight: 295 lb 14.3 oz (134.216 kg) 304 lb 14.3 oz (138.3 kg) 296 lb 15.4 oz (134.7 kg)     Exam:  GENERAL: NAD  HEENT: head NCAT, no scleral icterus. Pupils round and reactive. Mucous membranes are moist. Posterior pharynx clear of any exudate or lesions.  NECK: Supple. No carotid bruits. No lymphadenopathy or thyromegaly.  LUNGS: Clear to auscultation. Less wheezing  HEART: Regular rate and rhythm without murmur. 2+ pulses, no JVD, no peripheral edema  ABDOMEN: Soft, nontender, and nondistended. Positive bowel sounds. No hepatosplenomegaly was noted.  EXTREMITIES: edema subsiding  NEUROLOGIC: Alert and oriented x3. Cranial nerves II through XII are grossly intact. Strength 5/5 in all 4.  PSYCHIATRIC: Normal mood and affect  SKIN: No ulceration or induration present.  Data Reviewed: Basic Metabolic Panel:  Recent Labs Lab 09/23/14 0530 09/24/14 1347 09/25/14 0510 09/26/14 0500 09/27/14 0512  NA 130* 134* 129* 131* 137  K 3.6 4.4 4.0 4.6 4.1  CL 96* 100* 99* 99* 101  CO2 24 22 20* 20* 26  GLUCOSE 141* 165* 199* 283* 177*  BUN 53* 74* 78* 80* 69*  CREATININE 5.90* 6.41* 6.38* 5.27* 4.43*  CALCIUM 8.1* 8.1* 7.7* 8.2* 9.2   Liver Function Tests:  Recent Labs Lab 09/21/14 1035  AST 33  ALT 29  ALKPHOS 85  BILITOT 0.8  PROT 6.2*  ALBUMIN 2.7*    Recent Labs Lab 09/21/14 1035  LIPASE 17*   CBC:  Recent Labs Lab 09/23/14 0530 09/24/14 0900 09/25/14 0510 09/26/14 0500 09/27/14 0512  WBC 8.7 7.2 7.7 6.6 6.7  HGB 13.5 12.4* 11.5* 10.8* 12.3*  HCT 40.2 36.9* 33.1* 31.5* 37.1*  MCV 86.6 85.6 85.8 86.1 88.1  PLT 297 271 243 255 309   CBG:  Recent Labs Lab 09/26/14 1144 09/26/14 1623 09/26/14 2129 09/27/14 0749 09/27/14 1247  GLUCAP 192* 269* 138* 156* 121*    Recent Results (from the past 240 hour(s))  MRSA PCR Screening     Status: None   Collection Time: 09/21/14  4:44 PM  Result Value Ref Range Status   MRSA by PCR NEGATIVE NEGATIVE Final    Comment:        The GeneXpert MRSA Assay (FDA approved for NASAL  specimens only), is one component of a comprehensive MRSA colonization surveillance program. It is not intended to diagnose MRSA infection nor to guide or monitor treatment for MRSA infections.      Scheduled Meds: . citalopram  20 mg Oral Daily  . cyclobenzaprine  10 mg Oral QHS  . furosemide  80 mg Intravenous 3 times per day  . gabapentin  100 mg Oral QHS  . insulin aspart  0-5 Units Subcutaneous QHS  . insulin aspart  0-9 Units Subcutaneous TID WC  . insulin glargine  15 Units Subcutaneous BID  . levothyroxine  75 mcg Oral QAC breakfast  . metoprolol tartrate  12.5 mg Oral BID   Continuous Infusions: . heparin 2,100 Units/hr (09/27/14 1454)   Time spent: 25  minutes,  Albertine Grates, MD PhD [facility] Pager [phone]. If 7 PM - 7 AM, please contact night-coverage at [email or web address], password [facility] 09/27/2014, 5:03 PM  LOS:  6 days

## 2014-09-27 NOTE — Progress Notes (Signed)
ANTICOAGULATION CONSULT NOTE - Follow Up Consult  Pharmacy Consult for Heparin Indication: Hx DVT  Allergies  Allergen Reactions  . Ibuprofen Other (See Comments)    MD told him not to take due to kidney disease.    Patient Measurements: Height:  (188 cm) Weight: 296 lb 15.4 oz (134.7 kg) IBW/kg (Calculated) : 82.2 Heparin Dosing Weight: 112 kg  Vital Signs: Temp: 98.2 F (36.8 C) (09/25 0536) Temp Source: Oral (09/25 0536) BP: 163/83 mmHg (09/25 0536) Pulse Rate: 109 (09/25 0536)  Labs:  Recent Labs  09/24/14 0900 09/24/14 1347 09/24/14 1710  09/25/14 0510 09/26/14 0500 09/26/14 1430 09/27/14 0512  HGB 12.4*  --   --   --  11.5* 10.8*  --  12.3*  HCT 36.9*  --   --   --  33.1* 31.5*  --  37.1*  PLT 271  --   --   --  243 255  --  309  APTT 71*  --  80*  --  79*  --   --   --   HEPARINUNFRC  --   --   --   < > 0.60 0.28* 0.41 0.29*  CREATININE  --  6.41*  --   --  6.38* 5.27*  --   --   CKTOTAL  --   --   --   --  1949* 1606*  --   --   < > = values in this interval not displayed.  Estimated Creatinine Clearance: 26.4 mL/min (by C-G formula based on Cr of 5.27).   Medications:  Heparin @ 1950 units/hr  Assessment: 44 YOM with PMH HTN, HLD, DM2, on Xarelto PTA for history of DVT in January 2016 in the setting of nephrotic syndrome admitted with intractable nausea, vomiting with left flank area pain, hypertensive urgency, and AKI. Xarelto was held and the patient was switched to IV Heparin on 9/22 for continued anticoagulation for hx DVT and concern for renal vein thrombosis. Last dose of Xarelto 20 mg was 9/20 at ~1700.  Imaging has ruled out renal vein thrombosis. MD attempting to get records regarding old DVT and recent visit to [facility] in July where extension of treatment was recommended to determine if long-term anticoagulation should be continued or is safe to d/c at this time.   Heparin level slightly subtherapeutic this morning. CBC stable. No  bleeding noted.  Goal of Therapy:  Heparin level 0.3-0.7 units/ml aPTT 66-102 seconds Monitor platelets by anticoagulation protocol: Yes   Plan:  Increase heparin to 2100 units/hr  F/u 8 hr heparin level  Christoper Fabian, PharmD, BCPS Clinical pharmacist, pager [phone] 09/27/2014 6:38 AM

## 2014-09-27 NOTE — Progress Notes (Signed)
ANTICOAGULATION CONSULT NOTE - Follow Up Consult  Pharmacy Consult for Heparin Indication: h/o DVT  Allergies  Allergen Reactions  . Ibuprofen Other (See Comments)    MD told him not to take due to kidney disease.    Patient Measurements: Height:  (188 cm) Weight: 296 lb 15.4 oz (134.7 kg) IBW/kg (Calculated) : 82.2 Heparin Dosing Weight: 112 kg  Vital Signs: Temp: 97.5 F (36.4 C) (09/25 1529) Temp Source: Oral (09/25 1529) BP: 137/66 mmHg (09/25 1529) Pulse Rate: 105 (09/25 1529)  Labs:  Recent Labs  09/25/14 0510 09/26/14 0500 09/26/14 1430 09/27/14 0512 09/27/14 1846  HGB 11.5* 10.8*  --  12.3*  --   HCT 33.1* 31.5*  --  37.1*  --   PLT 243 255  --  309  --   APTT 79*  --   --   --   --   HEPARINUNFRC 0.60 0.28* 0.41 0.29* 0.33  CREATININE 6.38* 5.27*  --  4.43*  --   CKTOTAL 1949* 1606*  --   --   --     Estimated Creatinine Clearance: 31.4 mL/min (by C-G formula based on Cr of 4.43).  Assessment:  Anticoag: h/o DVT.Hgb now improved to 12.3. HL 0.29, 0.33 now in goal. MD still trying to get [facility].  Goal of Therapy:  Heparin level 0.3-0.7 units/ml Monitor platelets by anticoagulation protocol: Yes   Plan:  Heparin 2100 units/hr Daily HL and CBC  Crystal S. Merilynn Finland, PharmD, BCPS Clinical Staff Pharmacist Pager [phone]  Misty Stanley Stillinger 09/27/2014,7:38 PM

## 2014-09-28 LAB — BASIC METABOLIC PANEL
Anion gap: 8 (ref 5–15)
BUN: 62 mg/dL — AB (ref 6–20)
CALCIUM: 8.7 mg/dL — AB (ref 8.9–10.3)
CO2: 28 mmol/L (ref 22–32)
CREATININE: 3.85 mg/dL — AB (ref 0.61–1.24)
Chloride: 101 mmol/L (ref 101–111)
GFR calc Af Amer: 21 mL/min — ABNORMAL LOW (ref >60)
GFR calc non Af Amer: 18 mL/min — ABNORMAL LOW (ref >60)
GLUCOSE: 186 mg/dL — AB (ref 65–99)
Potassium: 4.2 mmol/L (ref 3.5–5.1)
Sodium: 137 mmol/L (ref 135–145)

## 2014-09-28 LAB — CK: Total CK: 573 U/L — ABNORMAL HIGH (ref 49–397)

## 2014-09-28 LAB — HEPARIN LEVEL (UNFRACTIONATED): HEPARIN UNFRACTIONATED: 0.33 [IU]/mL (ref 0.30–0.70)

## 2014-09-28 LAB — GLUCOSE, CAPILLARY
Glucose-Capillary: 136 mg/dL — ABNORMAL HIGH (ref 65–99)
Glucose-Capillary: 182 mg/dL — ABNORMAL HIGH (ref 65–99)
Glucose-Capillary: 150 mg/dL — ABNORMAL HIGH (ref 65–99)
Glucose-Capillary: 217 mg/dL — ABNORMAL HIGH (ref 65–99)

## 2014-09-28 NOTE — Progress Notes (Signed)
PROGRESS NOTE  Russell [name] XBM:[id number] DOB: 1970/06/09 DOA: 09/21/2014 PCP: [name], MD  HPI: Pt is 43 yo male with known HTN, HLD, DM type II with complications of neuropathy and retinopathy, obesity presented to [facility] ED with main concern of several days duration of progressive nausea and non bloody vomiting, poor oral intake, unable to take any medications due to his symptoms. He denies fevers, chills, recent sick contacts or exposures, no specific abdominal pain, no urinary concerns. Pt also denies chest pain or shortness of breath. In ED, pt noted to be uncomfortable due to vomiting. Blood work notable for Cr 2.15 (was WNL 4 months ago). TRH asked to admit for further evaluation. SDU requested due to BP 215/115  Subjective / 24 H Interval events -feeling better, less wheezing, less edema, no n/v - good urine output, cr coming down - bp /heart rate better  Assessment/Plan: Principal Problem:   Intractable nausea and vomiting Active Problems:   DVT (deep venous thrombosis), H/o 01/2014-on Xarelto   Hypothyroidism (acquired)   Uncontrolled diabetes mellitus with retinopathy   Hypertensive urgency   Acute renal failure   Diabetic neuropathy   Uncontrolled diabetes mellitus type 2 with peripheral artery disease   Morbid obesity   Nausea with vomiting   Acute on chronic renal failure   Acute on chronic renal failure - was initially admitted to [facility] long, due to continue worsening of creatinine , patient was transferred to [place] on 9/21 -etiology not entirely clear, he is now becoming oliguric with only ~150 cc UOP today, and his Creatinine continue to increase. - patient reportedly had a renal vein clotting on renal artery [place] at [facility] in July (but not able to locate the imaging report),  -CT ab here also had non specific bilateral perinephric stranding --renal artery duplex done on 9/21: no renal artery stenosis, patent bilateral renal veins.  -improving,  continue IV lasix, likely transition to oral lasix soon, appreciate nephrology input.  Intractable nausea and vomiting with left flank area pain  - unclear etiology at this time and possibly related to #1, improving, able to eat better, likely related to elevated blood pressure, consider gastric emptying study if symptom returns when bp well controlled to r/o gastoparesis - CT abd and pelvis wo contrast suggestive of renal inflammation, ? Pyelonephritis  - placed on Rocephin 9/20 but pt with no fever and normal WBC, rocephin discontinued as it is unlikely to be infectious - continue to provide analgesia as needed - no complaint of n/v and flank pain on 9/22  Hypertensive urgency - likely exacerbated by N/V and poor oral intake - home meds Metoprolol 100 mg PO ER and Hydralazine 25 mg PO TID held for adequate diuresis - stopped Lisinopril due to ARF, also stopped Torsemide - bp and heart rate started to increase, start back low dose lopressor on 9/24.   Mixed restrictive/obstructive pulmonary defect (per outpatient PFT done at [facility]) - stable, albuterol as needed - he has some upper airway wheezing (? VCD) -duoneb, less wheezing  Hyponatremia - likely in the setting of fluid overload -normalized on 9/25  Uncontrolled diabetes mellitus type 2 with peripheral artery disease - patient reported taking Lantus 30units qhs, for now likely will need decrease insulin dose in the setting of worsening renal function, will close monitor - place on SSI - continue to hold Metformin  -a1c 10.3  Obesity - Body mass index is 33.92 kg/(m^2).  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 He was evaluated by Hem[facility]Onc at [facility] in July with recommendations that [facility] is continued given extension of his superficial clot. - start heparin gtt,  -Renal v  duplex (07/10/14 from [facility]): Negative for renal vein thromboses or renal artery stenosis bilaterally -  heparin drip for now, awaiting records from [facility], patient reported he was diagnosed with DVT there, though, i donot think this is reliable. -still waiting for [place] record, contact hematology Dr. Merlene Pulling who saw patient in 06/2014 for anticoagulation issues, likely will d/c anticoagulation once patient become more mobile. -called Dr Danton Sewer office, currently not in the office, will call later again. ( [phone])  Uncontrolled diabetes mellitus with retinopathy - outpatient follow up - d/w patient proper glucose monitoring and control of CBG's  Diabetic neuropathy - continue Gabapentin , reduce dose to 100 mg po qhs in the setting of worsening renal function  Hypothyroidism (acquired) - continue Synthroid    "Discharge summary from 07/2017 ([facility]) Acute on chronic renal failure, nephrotic syndrome Patient has a history of nephrotic syndrome likely secondary to nephropathy from poorly controlled DM II. Patient was agreeable to biopsy but horseshoe kidney prevented percutaneous approach. Urology agreed to biopsy the patient's kidney and performed this on 07/20/2014. The patient tolerated the procedure well and anti-coagulation is intended to be held x 48-72 hours. Initial creatinine was 1.54; creatinine on discharge was 2.09. Patient will follow up with Nephrology.   Hypercoagulability, etiology unknown Patient has a history of superficial thrombophlebitis with progression of clot burden despite anti-coagulation with Xarelto. Approximately 3 weeks ago the patient discontinued his anti-coagulation (he states at the direction of a physician, although this is unclear from outside records). Hem/Onc was consulted to advise on hypercoagulability and anti-coagulation. They performed a hypercoagulability work up. Results: free protein S ag 94,  protein C activity 123, plasminogen 94, antithrombin III 74, factor V absent, factor [place] 20210 mutation absent, cardiolipin igg ab <14, igm <12, iga <11, beta-2-glyco igg/igm/iga <9, lupus inhibitor not detected. Patient was bridged on heparin drip while awaiting biopsy. Hem/Onc recommended continuing anti-coagulation at discharge.  Mixed restrictive/obstructive pulmonary defect Patient noted to be wheezing with subjective dyspnea during his inpatient stay. Pulm was consulted and obtained PFTs. Results demonstrated mixed restrictive/obstructive pulmonary defect. Recommended initiating symbicort. Patient will have follow up with Pulm on an outpatient basis.  DM II w/ retinopathy and neuropathy Metformin was held during the patient's inpatient stay. Blood sugars were often >200-300 despite uptitration of lantus to as high as 55 units daily. Patient will be continued on renal dose adjusted metformin on discharge and an increased dose of lantus as noted on med rec below. Endocrine follow up will be scheduled via [facility] [employer] referral. "   Diet: Diet Carb Modified Fluid consistency:: Thin; Room service appropriate?: Yes Fluids: none  DVT Prophylaxis: heparin gtt  Code Status: Full Code Family Communication: patient  Disposition Plan: home when medically stable  Consultants:  Nephrology  Procedures:  None   Antibiotics Ceftriaxone 9/20 >> 9/21   Studies  No results found.  Objective  Filed Vitals:   09/27/14 1529 09/27/14 2138 09/28/14 0525 09/28/14 0949  BP: 137/66 167/95 145/84 166/93  Pulse: 105 115 98 94  Temp: 97.5 F (36.4 C) 98.4 F (36.9 C) 98.7 F (37.1 C) 97.9 F (36.6 C)  TempSrc:  Oral Oral Oral Oral  Resp: Height:   (1.88 m)    Weight:  294 lb 15.6 oz (133.8 kg)    SpO2: 97% 95% 98% 98%    Intake/Output Summary (Last 24 hours) at 09/28/14 1321 Last data filed at 09/28/14 1100  Gross per 24 hour  Intake    845 ml  Output   3765 ml  Net   -2920 ml   Filed Weights   09/25/14 2050 09/27/14 0536 09/27/14 2138  Weight: 304 lb 14.3 oz (138.3 kg) 296 lb 15.4 oz (134.7 kg) 294 lb 15.6 oz (133.8 kg)    Exam:  GENERAL: NAD  HEENT: head NCAT, no scleral icterus. Pupils round and reactive. Mucous membranes are moist. Posterior pharynx clear of any exudate or lesions.  NECK: Supple. No carotid bruits. No lymphadenopathy or thyromegaly.  LUNGS: Clear to auscultation. Less wheezing, does has upper airway wheezing  HEART: Regular rate and rhythm without murmur. 2+ pulses, no JVD, less peripheral edema  ABDOMEN: Soft, nontender, and nondistended. Positive bowel sounds. No hepatosplenomegaly was noted.  EXTREMITIES: edema subsiding  NEUROLOGIC: Alert and oriented x3. Cranial nerves II through XII are grossly intact. Strength 5/5 in all 4.  PSYCHIATRIC: Normal mood and affect  SKIN: No ulceration or induration present.  Data Reviewed: Basic Metabolic Panel:  Recent Labs Lab 09/24/14 1347 09/25/14 0510 09/26/14 0500 09/27/14 0512 09/28/14 0352  NA 134* 129* 131* 137 137  K 4.4 4.0 4.6 4.1 4.2  CL 100* 99* 99* 101 101  CO2 22 20* 20* 26 28  GLUCOSE 165* 199* 283* 177* 186*  BUN 74* 78* 80* 69* 62*  CREATININE 6.41* 6.38* 5.27* 4.43* 3.85*  CALCIUM 8.1* 7.7* 8.2* 9.2 8.7*   Liver Function Tests: No results for input(s): AST, ALT, ALKPHOS, BILITOT, PROT, ALBUMIN in the last 168 hours. No results for input(s): LIPASE, AMYLASE in the last 168 hours. CBC:  Recent Labs Lab 09/23/14 0530 09/24/14 0900 09/25/14 0510 09/26/14 0500 09/27/14 0512  WBC 8.7 7.2 7.7 6.6 6.7  HGB 13.5 12.4* 11.5* 10.8* 12.3*  HCT 40.2 36.9* 33.1* 31.5* 37.1*  MCV 86.6 85.6 85.8 86.1 88.1  PLT 297 271 243 255 309   CBG:  Recent Labs Lab 09/27/14 1247 09/27/14 1705 09/27/14 2138 09/28/14 0743 09/28/14 1115  GLUCAP 121* 207* 199* 136* 182*    Recent Results (from the past 240 hour(s))  MRSA PCR Screening     Status: None    Collection Time: 09/21/14  4:44 PM  Result Value Ref Range Status   MRSA by PCR NEGATIVE NEGATIVE Final    Comment:        The GeneXpert MRSA Assay (FDA approved for NASAL specimens only), is one component of a comprehensive MRSA colonization surveillance program. It is not intended to diagnose MRSA infection nor to guide or monitor treatment for MRSA infections.      Scheduled Meds: . citalopram  20 mg Oral Daily  . cyclobenzaprine  10 mg Oral QHS  . furosemide  80 mg Intravenous 3 times per day  . gabapentin  100 mg Oral QHS  . insulin aspart  0-5 Units Subcutaneous QHS  . insulin aspart  0-9 Units Subcutaneous TID WC  . insulin glargine  15 Units Subcutaneous BID  . levothyroxine  75 mcg Oral QAC breakfast  . metoprolol tartrate  12.5 mg Oral BID   Continuous Infusions: . heparin 2,100 Units/hr (09/27/14 2238)   Time spent: 35  minutes,  Albertine Grates, MD PhD [facility] Pager [phone]. If 7 PM - 7 AM, please contact night-coverage at [email or web address], password [facility] 09/28/2014, 1:21 PM  LOS: 7 days

## 2014-09-28 NOTE — Progress Notes (Signed)
ANTICOAGULATION CONSULT NOTE  Pharmacy Consult for Heparin Indication: Hx DVT  Allergies  Allergen Reactions  . Ibuprofen Other (See Comments)    MD told him not to take due to kidney disease.    Patient Measurements: Height:  (188 cm) Weight: 294 lb 15.6 oz (133.8 kg) IBW/kg (Calculated) : 82.2 Heparin Dosing Weight: 112 kg  Vital Signs: Temp: 98.7 F (37.1 C) (09/26 0525) Temp Source: Oral (09/26 0525) BP: 145/84 mmHg (09/26 0525) Pulse Rate: 98 (09/26 0525)  Labs:  Recent Labs  09/26/14 0500  09/27/14 0512 09/27/14 1846 09/28/14 0352  HGB 10.8*  --  12.3*  --   --   HCT 31.5*  --  37.1*  --   --   PLT 255  --  309  --   --   HEPARINUNFRC 0.28*  < > 0.29* 0.33 0.33  CREATININE 5.27*  --  4.43*  --  3.85*  CKTOTAL 1606*  --   --   --  573*  < > = values in this interval not displayed.  Estimated Creatinine Clearance: 36 mL/min (by C-G formula based on Cr of 3.85).   Medications:  Heparin @ 1950 units/hr  Assessment: 64 YOM with PMH HTN, HLD, DM2, on Xarelto PTA for history of DVT in January 2016 in the setting of nephrotic syndrome admitted with intractable nausea, vomiting with left flank area pain, hypertensive urgency, and AKI. Xarelto was held and the patient was switched to IV Heparin on 9/22 for continued anticoagulation for hx DVT and concern for renal vein thrombosis. Last dose of Xarelto 20 mg was 9/20 at ~1700.  Imaging ruled out renal vein thrombosis. MD attempting to get records regarding old DVT and recent visit to [facility] in July where extension of treatment was recommended to determine if long-term anticoagulation should be continued or is safe to d/c at this time. Meanwhile, HL is therapeutic, CBC is stable, no s/sx bleeding.  Goal of Therapy:  Heparin level 0.3-0.7 units/ml aPTT 66-102 seconds Monitor platelets by anticoagulation protocol: Yes   Plan:  Continue heparin at 2100 units/hr  Daily HL, CBC F/u longterm plan for  anticoagulation    Agapito Games, PharmD, BCPS Clinical Pharmacist Pager: [phone] 09/28/2014 8:31 AM

## 2014-09-28 NOTE — Progress Notes (Signed)
[facility] Progress Note   Subjective: Cr down to 3.85. Patient feels better, denies SOB.   Filed Vitals:   09/27/14 1529 09/27/14 2138 09/28/14 0525 09/28/14 0949  BP: 137/66 167/95 145/84 166/93  Pulse: 105 115 98 94  Temp: 97.5 F (36.4 C) 98.4 F (36.9 C) 98.7 F (37.1 C) 97.9 F (36.6 C)  TempSrc: Oral Oral Oral Oral  Resp: Height:   (1.88 m)    Weight:  294 lb 15.6 oz (133.8 kg)    SpO2: 97% 95% 98% 98%   Physical Exam: General: alert, sitting up in bed, NAD CVS: RRR, no m/g/r Pulm: CTA bilaterally, breaths non-labored Abd: BS+, soft, non-tender  Ext: 2+ bilat LE pitting edema up to the hips Neuro: alert and oriented x 3, no asterixis  Urine sediment (undersigned) > numerous granular casts, 3-5 wbc per hpf, 2-4 rbc per hpf, no cellular casts UA > 1.025, >300 prot, >1000 glu, 3-6 wbc, 3-6 rbc CXR > left base atx otherwise wnl CT abd > lower chest clear, liver/ GB/ panc/ spleen are normal. Horseshoe kidney noted w mild/mod perinephric stranding and small amt of fluid within the air renal spaces extending into the retroperitoneum into the pelvis. No hydro. No ascites. UNa 23 UCreat 103  FeNa = 1.0 ANA, ANCA, hep C neg, C3/C4 normal  Assessment and Plan: 1. AKI w known CKD3 / Nephrotic Syndrome Underlying: suspect ATN due to relative hypotension and severe NS secondary to diabetes. [facility] but they did not get tissue needed to make diagnosis.  Cr continues to improve, 3.85 down from 4.43. Will continue Lasix 80 mg IV Q8H for another 24 hours as he is still volume up. Encourage to mobilize.  2. Hx "DVT" on xarelto: No DVT yet confirmed, primary MD looking into this. On hep for now. 3. HTN: BP 130s-160s systolic. On Lopressor 12.5 mg BID and has Hydralazine 5 mg Q2H PRN and Lopressor 2.5 mg IV Q8H PRN for systolic BP > 180.  4. Type 2 DM: Hx retinopathy and neuropathy. Last HbA1c 10.3 on 09/21/14. CBGs in  130s-200s in hospital. Currently on Lantus 15 units BID and sensitive ISS.    Rich Number, MD, MPH Internal Medicine Resident, PGY-II Pager: [phone]    09/28/2014, 11:56 AM     Recent Labs Lab 09/26/14 0500 09/27/14 0512 09/28/14 0352  NA 131* 137 137  K 4.6 4.1 4.2  CL 99* 101 101  CO2 20* 26 28  GLUCOSE 283* 177* 186*  BUN 80* 69* 62*  CREATININE 5.27* 4.43* 3.85*  CALCIUM 8.2* 9.2 8.7*   No results for input(s): AST, ALT, ALKPHOS, BILITOT, PROT, ALBUMIN in the last 168 hours.  Recent Labs Lab 09/25/14 0510 09/26/14 0500 09/27/14 0512  WBC 7.7 6.6 6.7  HGB 11.5* 10.8* 12.3*  HCT 33.1* 31.5* 37.1*  MCV 85.8 86.1 88.1  PLT 243 255 309   . citalopram  20 mg Oral Daily  . cyclobenzaprine  10 mg Oral QHS  . furosemide  80 mg Intravenous 3 times per day  . gabapentin  100 mg Oral QHS  . insulin aspart  0-5 Units Subcutaneous QHS  . insulin aspart  0-9 Units Subcutaneous TID WC  . insulin glargine  15 Units Subcutaneous BID  . levothyroxine  75 mcg Oral QAC breakfast  . metoprolol tartrate  12.5 mg Oral BID   . heparin 2,100 Units/hr (09/27/14 2238)   acetaminophen,  ALPRAZolam, hydrALAZINE, HYDROcodone-acetaminophen, HYDROmorphone (DILAUDID) injection, ipratropium-albuterol, metoprolol, ondansetron **OR** ondansetron (ZOFRAN) IV, promethazine

## 2014-09-29 ENCOUNTER — Telehealth: Payer: Self-pay | Admitting: Hematology and Oncology

## 2014-09-29 LAB — RENAL FUNCTION PANEL
ALBUMIN: 2.2 g/dL — AB (ref 3.5–5.0)
Anion gap: 9 (ref 5–15)
BUN: 54 mg/dL — ABNORMAL HIGH (ref 6–20)
CHLORIDE: 97 mmol/L — AB (ref 101–111)
CO2: 30 mmol/L (ref 22–32)
CREATININE: 3.34 mg/dL — AB (ref 0.61–1.24)
Calcium: 8.7 mg/dL — ABNORMAL LOW (ref 8.9–10.3)
GFR, EST AFRICAN AMERICAN: 24 mL/min — AB (ref >60)
GFR, EST NON AFRICAN AMERICAN: 21 mL/min — AB (ref >60)
Glucose, Bld: 161 mg/dL — ABNORMAL HIGH (ref 65–99)
PHOSPHORUS: 5.6 mg/dL — AB (ref 2.5–4.6)
Potassium: 3.8 mmol/L (ref 3.5–5.1)
Sodium: 136 mmol/L (ref 135–145)

## 2014-09-29 LAB — GLUCOSE, CAPILLARY
GLUCOSE-CAPILLARY: 129 mg/dL — AB (ref 65–99)
GLUCOSE-CAPILLARY: 185 mg/dL — AB (ref 65–99)
Glucose-Capillary: 243 mg/dL — ABNORMAL HIGH (ref 65–99)

## 2014-09-29 LAB — HEPARIN LEVEL (UNFRACTIONATED): HEPARIN UNFRACTIONATED: 0.4 [IU]/mL (ref 0.30–0.70)

## 2014-09-29 MED ORDER — FUROSEMIDE 80 MG PO TABS
160.0000 mg | ORAL_TABLET | Freq: Two times a day (BID) | ORAL | Status: DC
  Administered 2014-09-29 (×2): 160 mg via ORAL
  Filled 2014-09-29 (×2): qty 2

## 2014-09-29 MED ORDER — GABAPENTIN 100 MG PO CAPS: 100.0000 mg | ORAL_CAPSULE | Freq: Every day | ORAL | Status: DC

## 2014-09-29 MED ORDER — ACETAMINOPHEN 500 MG PO TABS: 500.0000 mg | ORAL_TABLET | Freq: Four times a day (QID) | ORAL | Status: DC | PRN

## 2014-09-29 MED ORDER — FUROSEMIDE 80 MG PO TABS: 160.0000 mg | ORAL_TABLET | Freq: Two times a day (BID) | ORAL | Status: DC

## 2014-09-29 NOTE — Telephone Encounter (Signed)
Re:  Superficial thrombosis and renal failure  Called by provider regarding discontinuing Xarelto in setting of renal failure and only documented superficial thrombosis.  Patient will be sent back to review all of primary data.  Notable proteinuria.  SPEP and UPEP will be sent.  Rosey Bath, MD

## 2014-09-29 NOTE — Progress Notes (Signed)
Pt provided with discharge instruction including information on follow up appointments and new medications. Pt verbalized understanding of all information. IV was dc'd with no complication. VSS. Pt received evening dose of Lasix, 160 mg. Pt escorted out by this RN.   Carrie Mew, RN

## 2014-09-29 NOTE — Discharge Summary (Signed)
Discharge Summary  Russell [name] ZOX:[id number] DOB: July 29, 1970  PCP: Russell Sidle, MD  Admit date: 09/21/2014 Discharge date: 09/29/2014  Time spent: >67mins  Recommendations for Outpatient Follow-up:  1. F/u with hematology Dr. Merlene [name] in one week, patient is advised to obtain [facility] medical record and bring with him to the follow up appointment, he expressed understanding. 2. F/u with nephrology (he reported he see Dr Russell [name]) in one week, repeat renal panel at follow up  Discharge Diagnoses:  Active Hospital Problems   Diagnosis Date Noted  . Intractable nausea and vomiting 09/21/2014  . Acute on chronic renal failure 09/24/2014  . Hypertensive urgency 09/21/2014  . Acute renal failure 09/21/2014  . Diabetic neuropathy 09/21/2014  . Uncontrolled diabetes mellitus type 2 with peripheral artery disease 09/21/2014  . Morbid obesity 09/21/2014  . Nausea with vomiting   . Uncontrolled diabetes mellitus with retinopathy 04/23/2014  . Hypothyroidism (acquired) 03/24/2014  . DVT (deep venous thrombosis), H/o 01/2014-on Xarelto 03/24/2014    Resolved Hospital Problems   Diagnosis Date Noted Date Resolved  No resolved problems to display.    Discharge Condition: stable  Diet recommendation: renal diet/heart healthy/carb modified  Filed Weights   09/27/14 0536 09/27/14 2138 09/28/14 1500  Weight: 296 lb 15.4 oz (134.7 kg) 294 lb 15.6 oz (133.8 kg) 282 lb 14.4 oz (128.323 kg)    History of present illness:  Pt is 43 yo male with known HTN, HLD, DM type II with complications of neuropathy and retinopathy, obesity presented to [facility] ED with main concern of several days duration of progressive nausea and non bloody vomiting, poor oral intake, unable to take any medications due to his symptoms. He denies fevers, chills, recent sick contacts or exposures, no specific abdominal pain, no urinary concerns. Pt also denies chest pain or shortness of breath.   In ED, pt noted to be  uncomfortable due to vomiting. Blood work notable for Cr 2.15 (was WNL 4 months ago). TRH asked to admit for further evaluation. SDU requested due to BP 215/115.  Hospital Course:  Principal Problem:   Intractable nausea and vomiting Active Problems:   DVT (deep venous thrombosis), H/o 01/2014-on Xarelto   Hypothyroidism (acquired)   Uncontrolled diabetes mellitus with retinopathy   Hypertensive urgency   Acute renal failure   Diabetic neuropathy   Uncontrolled diabetes mellitus type 2 with peripheral artery disease   Morbid obesity   Nausea with vomiting   Acute on chronic renal failure  Acute on chronic renal failure - was initially admitted to [facility] long, due to continue worsening of creatinine , patient was transferred to [facility] on 9/21 -etiology not entirely clear, he is now becoming oliguric with only ~150 cc UOP today, and his Creatinine continue to increase. - patient reportedly had a renal vein clotting on renal artery [place] at [facility] in July (but not able to locate the imaging report),  -CT ab here also had non specific bilateral perinephric stranding --renal artery duplex done on 9/21: no renal artery stenosis, patent bilateral renal veins.  -improving on IV lasix,  Cr peak at 6.4, cr 3.34 at discharge.  -transition to oral lasix 60mg  po bid, talked to nephrologist Dr. Hyman [name] over the phone, he is ok with discharge patient and close follow up with nephrology. appreciate nephrology input.  Intractable nausea and vomiting with left flank area pain  - possibly related to #1 and very elevated blood pressure on admission, less likely gastoparesis - CT abd and pelvis wo contrast  suggestive of mild renal inflammation, - placed on Rocephin 9/20 but pt with no fever and normal WBC, rocephin discontinued as it is unlikely to be infectious - continue to provide analgesia as needed,  - no complaint of n/v and flank pain at discharge  Hypertensive urgency - likely exacerbated by N/V  and poor oral intake - home meds Metoprolol 100 mg PO ER and Hydralazine 25 mg PO TID held for adequate diuresis - stopped Lisinopril due to ARF, also stopped Torsemide - bp and heart rate started to increase, start back low dose lopressor on 9/24.  -patient is discharged on toprol Xl100mg  po qd and lasix 60mg  po bid. Nephrology to continue adjust bp meds  Mixed restrictive/obstructive pulmonary defect (per outpatient PFT done at [facility]) - stable, albuterol as needed - he has some upper airway wheezing (? VCD) -duoneb, less wheezing, on room air.  Hyponatremia - likely in the setting of fluid overload -normalized on 9/25  Uncontrolled diabetes mellitus type 2 with peripheral artery disease -  Metformin discountinued due to renal impairment. -a1c 10.3, continue insulin.  Obesity - Body mass index is 33.92 kg/(m^2).  History of superficial venous thrombosis, H/o 01/2014  (no evident of DVT) - Xarelto discontinued due to worsening of renal function - he apparently was started on anticoagulation and at one point this was discontinued. He was evaluated by Hem[facility]Onc at [facility] in July with recommendations that [facility] is continued given extension of his superficial clot. - he was treated with heparin gtt,  -Renal v duplex (07/10/14 from [facility]): Negative for renal vein thromboses or renal artery stenosis bilaterally Repeat renal vascular imaging at [facility], no renal vein thrombosis, patent renal arteries. -contact hematology Dr. Merlene [name] who saw patient in 06/2014 for anticoagulation issues, d/c anticoagulation now that patient become more mobile, patient is to bring in [facility] medical records for Dr. Merlene [name] to review. Per [facility] record (see below, also available under care everywhere), hypercoagulability work up negative, Dr Russell [name] recommended spep/upep which was sent prior to discharge, and patient is going to follow up with Dr. Merlene [name].   Uncontrolled diabetes mellitus with  retinopathy - outpatient follow up - d/w patient proper glucose monitoring and control of CBG's  Diabetic neuropathy - continue Gabapentin , reduce dose to 100 mg po qhs in the setting of worsening renal function  Hypothyroidism (acquired) - continue Synthroid    "Discharge summary from 07/2017 ([facility]) Acute on chronic renal failure, nephrotic syndrome Patient has a history of nephrotic syndrome likely secondary to nephropathy from poorly controlled DM II. Patient was agreeable to biopsy but horseshoe kidney prevented percutaneous approach. Urology agreed to biopsy the patient's kidney and performed this on 07/20/2014. The patient tolerated the procedure well and anti-coagulation is intended to be held x 48-72 hours. Initial creatinine was 1.54; creatinine on discharge was 2.09. Patient will follow up with Nephrology.   Hypercoagulability, etiology unknown Patient has a history of superficial thrombophlebitis with progression of clot burden despite anti-coagulation with Xarelto. Approximately 3 weeks ago the patient discontinued his anti-coagulation (he states at the direction of a physician, although this is unclear from outside records). Hem/Onc was consulted to advise on hypercoagulability and anti-coagulation. They performed a hypercoagulability work up. Results: free protein S ag 94, protein C activity 123, plasminogen 94, antithrombin III 74, factor V absent, factor [place] 20210 mutation absent, cardiolipin igg ab <14, igm <12, iga <11, beta-2-glyco igg/igm/iga <9, lupus inhibitor not detected. Patient was bridged on heparin drip while awaiting  biopsy. Hem/Onc recommended continuing anti-coagulation at discharge.  Mixed restrictive/obstructive pulmonary defect Patient noted to be wheezing with subjective dyspnea during his inpatient stay. Pulm was consulted and obtained PFTs. Results demonstrated mixed restrictive/obstructive pulmonary defect. Recommended initiating  symbicort. Patient will have follow up with Pulm on an outpatient basis.  DM II w/ retinopathy and neuropathy Metformin was held during the patient's inpatient stay. Blood sugars were often >200-300 despite uptitration of lantus to as high as 55 units daily. Patient will be continued on renal dose adjusted metformin on discharge and an increased dose of lantus as noted on med rec below. Endocrine follow up will be scheduled via [facility] [employer] referral. "   Diet: Diet Carb Modified Fluid consistency:: Thin; Room service appropriate?: Yes Fluids: none  DVT Prophylaxis: heparin gtt  Code Status: Full Code Family Communication: patient  Disposition Plan: home   Consultants:  Nephrology  Hematology phone conversation  Procedures:  None   Antibiotics Ceftriaxone 9/20 >> 9/21   Discharge Exam: BP 155/96 mmHg  Pulse 97  Temp(Src) 98.1 F (36.7 C) (Oral)  Resp 17  Ht  (1.88 m)  Wt 282 lb 14.4 oz (128.323 kg)  BMI 36.31 kg/m2  SpO2 96%   GENERAL: NAD  HEENT: head NCAT, no scleral icterus. Pupils round and reactive. Mucous membranes are moist. Posterior pharynx clear of any exudate or lesions.  NECK: Supple. No carotid bruits. No lymphadenopathy or thyromegaly.  LUNGS: Clear to auscultation. much Less wheezing, does has upper airway wheezing  HEART: Regular rate and rhythm without murmur. 2+ pulses, no JVD, less peripheral edema  ABDOMEN: Soft, nontender, and nondistended. Positive bowel sounds. No hepatosplenomegaly was noted.  EXTREMITIES: edema subsiding  NEUROLOGIC: Alert and oriented x3. Cranial nerves II through XII are grossly intact. Strength 5/5 in all 4.  PSYCHIATRIC: Normal mood and affect  SKIN: No ulceration or induration present.   Discharge Instructions You were cared for by a hospitalist during your hospital stay. If you have any questions about your discharge medications or the care you received while you were in the hospital after you are  discharged, you can call the unit and asked to speak with the hospitalist on call if the hospitalist that took care of you is not available. Once you are discharged, your primary care physician will handle any further medical issues. Please note that NO REFILLS for any discharge medications will be authorized once you are discharged, as it is imperative that you return to your primary care physician (or establish a relationship with a primary care physician if you do not have one) for your aftercare needs so that they can reassess your need for medications and monitor your lab values.  Discharge Instructions    Diet - low sodium heart healthy    Complete by:  As directed   Carb modified, renal diet     Increase activity slowly    Complete by:  As directed             Medication List    STOP taking these medications        gabapentin 600 MG tablet  Commonly known as:  NEURONTIN  Replaced by:  gabapentin 100 MG capsule     lisinopril 10 MG tablet  Commonly known as:  PRINIVIL,ZESTRIL     metFORMIN 1000 MG tablet  Commonly known as:  GLUCOPHAGE     rivaroxaban 20 MG Tabs tablet  Commonly known as:  XARELTO     torsemide 20 MG  tablet  Commonly known as:  DEMADEX      TAKE these medications        acetaminophen 500 MG tablet  Commonly known as:  TYLENOL  Take 1 tablet (500 mg total) by mouth every 6 (six) hours as needed for moderate pain.     albuterol 108 (90 BASE) MCG/ACT inhaler  Commonly known as:  PROVENTIL HFA;VENTOLIN HFA  Inhale 1 puff into the lungs every 6 (six) hours as needed for wheezing or shortness of breath.     albuterol (2.5 MG/3ML) 0.083% nebulizer solution  Commonly known as:  PROVENTIL  Take 3 mLs (2.5 mg total) by nebulization every 6 (six) hours as needed for wheezing or shortness of breath.     albuterol-ipratropium 18-103 MCG/ACT inhaler  Commonly known as:  COMBIVENT  Inhale 2 puffs into the lungs every 4 (four) hours.     ALPRAZolam 0.5 MG  tablet  Commonly known as:  XANAX  TAKE 1 TABLET BY MOUTH TWICE A DAY AS NEEDED FOR SLEEP     citalopram 20 MG tablet  Commonly known as:  CELEXA  Take 1 tablet (20 mg total) by mouth daily. TAKE 1 TABLET (20 MG TOTAL) BY MOUTH DAILY    "OV NEEDED FOR REFILLS"     cyclobenzaprine 10 MG tablet  Commonly known as:  FLEXERIL  Take 1 tablet (10 mg total) by mouth at bedtime.     furosemide 80 MG tablet  Commonly known as:  LASIX  Take 2 tablets (160 mg total) by mouth 2 (two) times daily.     gabapentin 100 MG capsule  Commonly known as:  NEURONTIN  Take 1 capsule (100 mg total) by mouth at bedtime.     glucose blood test strip  Commonly known as:  ACCU-CHEK AVIVA  Use 6-8x a day     HYDROcodone-acetaminophen 5-325 MG tablet  Commonly known as:  NORCO/VICODIN  Take 1 tablet by mouth every 6 (six) hours as needed for moderate pain.     insulin glargine 100 UNIT/ML injection  Commonly known as:  LANTUS  Inject 0.15 mLs (15 Units total) into the skin 2 (two) times daily.     Insulin Lispro (Human) 200 UNIT/ML Sopn  Commonly known as:  HUMALOG KWIKPEN  Inject 33 Units into the skin 3 (three) times daily before meals.     levothyroxine 50 MCG tablet  Commonly known as:  SYNTHROID, LEVOTHROID  Take 1 tablet (50 mcg total) by mouth daily.     metoprolol succinate 100 MG 24 hr tablet  Commonly known as:  TOPROL XL  Take 1 tablet (100 mg total) by mouth daily.     promethazine 25 MG tablet  Commonly known as:  PHENERGAN  Take 1 tablet (25 mg total) by mouth every 6 (six) hours as needed for nausea.     traMADol 50 MG tablet  Commonly known as:  ULTRAM  Take 1 tablet (50 mg total) by mouth every 8 (eight) hours as needed.       Allergies  Allergen Reactions  . Ibuprofen Other (See Comments)    MD told him not to take due to kidney disease.       Follow-up Information    Follow up with Rosey Bath, MD In 1 week.   Specialty:  Hematology and Oncology   Why:   anticoagulation recommendations   Contact information:   [address] [place] [address] [phone]  Follow up with GOLDSBOROUGH,KELLIE A, MD In 1 week.   Specialty:  Nephrology   Why:  hospital discharge follow up   Contact information:   [address] [place] [address] [phone]        The results of significant diagnostics from this hospitalization (including imaging, microbiology, ancillary and laboratory) are listed below for reference.    Significant Diagnostic Studies: Ct Abdomen Pelvis Wo Contrast  09/21/2014   CLINICAL DATA:  44 year old male with intractable nausea and vomiting for 3 days.  EXAM: CT ABDOMEN AND PELVIS WITHOUT CONTRAST  TECHNIQUE: Multidetector CT imaging of the abdomen and pelvis was performed following the standard protocol without IV contrast.  COMPARISON:  12/12/2013 CT  FINDINGS: Please note that parenchymal abnormalities may be missed without intravenous contrast.  Lower chest:  No acute abnormalities.  Hepatobiliary: The liver and gallbladder are unremarkable. There is no evidence of biliary dilatation.  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 There is mild- moderate perinephric inflammation and small amount of fluid within the air renal spaces extending in the retroperitoneum into the pelvis. There is no evidence of hydronephrosis or urinary calculi. The adrenal glands and bladder are unremarkable.  Stomach/Bowel: There is no evidence of bowel obstruction or definite bowel wall thickening. The appendix is normal.  Vascular/Lymphatic: No enlarged lymph nodes or abdominal aortic aneurysm.  Reproductive: Prostate within normal limits.  Other: A trace amount of free pelvic fluid is identified. There is no evidence of pneumoperitoneum.  Musculoskeletal: No acute or suspicious abnormalities identified.  IMPRESSION: Mild-moderate inflammation/stranding with small amount  of retroperitoneal and free pelvic fluid. This is nonspecific but could be related to renal inflammation or infection. 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   Electronically Signed   By: Harmon Pier M.D.   On: 09/21/2014 16:31   Ct Head Wo Contrast  09/21/2014   CLINICAL DATA:  Complains of gradual onset headache 3 days ago accompanied by multiple episodes of vomiting. He's been unable to hold down any of his medications including his blood pressure medications. No other associated symptoms.  EXAM: CT HEAD WITHOUT CONTRAST  TECHNIQUE: Contiguous axial images were obtained from the base of the skull through the vertex without intravenous contrast.  COMPARISON:  None.  FINDINGS: Ventricles are normal in size and configuration. There are no parenchymal masses or mass effect. There is no evidence of an infarct. No extra-axial masses or abnormal fluid collections.  There is no intracranial hemorrhage.  Visualized sinuses and mastoid air cells are clear. No skull lesion.  IMPRESSION: Normal unenhanced CT scan of the brain.   Electronically Signed   By: Amie Portland M.D.   On: 09/21/2014 14:40   Mr Maxine Glenn Abdomen Wo Contrast  09/25/2014   CLINICAL DATA:  Diabetes, hypertension, obesity, acute renal failure. Evaluate for renal vascular disease. Nausea and vomiting.  EXAM: MRA ABDOMEN WITHOUT CONTRAST  TECHNIQUE: Angiographic images of the chest were obtained using MRA technique without intravenous contrast.  CONTRAST:  None.  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  Exam is limited because of motion artifact, body habitus, and lack of intravenous contrast.  Within the limits of the study, aorta is patent and normal in caliber. No aneurysm or occlusive process. Celiac and SMA origins are patent.  An accessory left upper pole renal artery is small in caliber and patent.  Main renal arteries are visualized bilaterally and are patent without evidence of ostial or proximal stenosis.  The IVC and renal veins appear patent without evidence of occlusion or acute thrombus.  Diffuse body wall subcutaneous edema noted compatible with anasarca.  No gallstones or biliary dilatation. Negative for bowel obstruction.  IMPRESSION: Patent renal arteries including an accessory left upper pole renal artery. No evidence of significant renal vascular occlusive process.  Horseshoe kidney configuration  No evidence of acute renal vein or IVC thrombosis or occlusion.   Electronically Signed   By: Judie Petit.  Shick M.D.   On: 09/25/2014 14:01   US Renal  09/23/2014   CLINICAL DATA:  Acute renal failure.  EXAM: RENAL / URINARY TRACT ULTRASOUND COMPLETE  COMPARISON:  CT 09/21/2014.  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 No focal mass. No hydronephrosis. Echotexture normal.  Left Kidney:  Horseshoe kidney. Left portion measures 12.9 cm. No focal mass. No hydronephrosis. Echotexture normal .  Bladder:  Bladder is nondistended.  IMPRESSION: Horseshoe kidney. Renal normal. No hydronephrosis. No bladder distention. No acute abnormality.   Electronically Signed   By: Maisie Fus  Register   On: 09/23/2014 07:10   Dg Chest Port 1 View  09/24/2014   CLINICAL DATA:  Acute shortness of breath and chest pain for 2 days.  EXAM: PORTABLE CHEST 1 VIEW  COMPARISON:  09/22/2014 and prior chest radiographs  FINDINGS: The cardiomediastinal silhouette is unremarkable.  Minimal bibasilar atelectasis noted.  There is no evidence of focal airspace disease, pulmonary edema, suspicious pulmonary nodule/mass, pleural effusion, or pneumothorax.  No acute bony abnormalities are identified.  IMPRESSION: Minimal bibasilar atelectasis without other significant abnormality.   Electronically Signed   By: Harmon Pier M.D.   On: 09/24/2014 13:06   Dg Abd Acute W/chest  09/22/2014   CLINICAL DATA:  Abdominal  tension, nausea, vomiting and RIGHT upper quadrant abdominal pain extending across abdomen, hypertension, diabetes mellitus, nephrotic syndrome  EXAM: DG ABDOMEN ACUTE W/ 1V CHEST  COMPARISON:  Chest radiograph 05/26/2014, CT abdomen pelvis 09/20/2014  FINDINGS: Borderline enlargement of cardiac silhouette.  Mediastinal contours and pulmonary vascularity normal.  Minimal LEFT basilar atelectasis.  Prominent RIGHT first costochondral junction unchanged.  No infiltrate, pleural effusion or pneumothorax.  Retained contrast in colon.  Nonobstructive bowel gas pattern.  No bowel dilatation or bowel wall thickening or free air.  Gas in rectum.  No urinary tract calcification or acute osseous findings.  IMPRESSION: LEFT basilar atelectasis.  Nonobstructive bowel gas pattern.   Electronically Signed   By: Ulyses Southward M.D.   On: 09/22/2014 22:30    Microbiology: Recent Results (from the past 240 hour(s))  MRSA PCR Screening     Status: None   Collection Time: 09/21/14  4:44 PM  Result Value Ref Range Status   MRSA by PCR NEGATIVE NEGATIVE Final    Comment:        The GeneXpert MRSA Assay (FDA approved for NASAL specimens only), is one component of a comprehensive MRSA colonization surveillance program. It is not intended to diagnose MRSA infection nor to guide or monitor treatment for MRSA infections.      Labs: Basic Metabolic Panel:  Recent Labs Lab 09/25/14 0510 09/26/14 0500 09/27/14 0512 09/28/14 0352 09/29/14 0450  NA 129* 131* 137 137 136  K 4.0 4.6 4.1 4.2 3.8  CL 99* 99* 101 101 97*  CO2 20* 20* 26 28 30   GLUCOSE 199* 283* 177* 186* 161*  BUN 78* 80*  69* 62* 54*  CREATININE 6.38* 5.27* 4.43* 3.85* 3.34*  CALCIUM 7.7* 8.2* 9.2 8.7* 8.7*  PHOS  --   --   --   --  5.6*   Liver Function Tests:  Recent Labs Lab 09/29/14 0450  ALBUMIN 2.2*   No results for input(s): LIPASE, AMYLASE in the last 168 hours. No results for input(s): AMMONIA in the last 168  hours. CBC:  Recent Labs Lab 09/23/14 0530 09/24/14 0900 09/25/14 0510 09/26/14 0500 09/27/14 0512  WBC 8.7 7.2 7.7 6.6 6.7  HGB 13.5 12.4* 11.5* 10.8* 12.3*  HCT 40.2 36.9* 33.1* 31.5* 37.1*  MCV 86.6 85.6 85.8 86.1 88.1  PLT 297 271 243 255 309   Cardiac Enzymes:  Recent Labs Lab 09/25/14 0510 09/26/14 0500 09/28/14 0352  CKTOTAL 1949* 1606* 573*   BNP: BNP (last 3 results) No results for input(s): BNP in the last 8760 hours.  ProBNP (last 3 results) No results for input(s): PROBNP in the last 8760 hours.  CBG:  Recent Labs Lab 09/28/14 1115 09/28/14 1609 09/28/14 2116 09/29/14 0801 09/29/14 1215  GLUCAP 182* 150* 217* 129* 185*       Signed:  Alyene Predmore MD, PhD  [facility] 09/29/2014, 5:28 PM

## 2014-09-29 NOTE — Care Management Note (Signed)
Case Management Note  Patient Details  Name: Russell [name] MRN: [id number] Date of Birth: 12-31-1970  Subjective/Objective:         CM following for progression and d/c planning.           Action/Plan: No d/c needs identified pt ambulatory and will d/c to home.   Expected Discharge Date:  09/29/2014              Expected Discharge Plan:  Home/Self Care  In-House Referral:  NA  Discharge planning Services  CM Consult  Post Acute Care Choice:  NA Choice offered to:  NA  DME Arranged:    DME Agency:     HH Arranged:    HH Agency:     Status of Service:  Completed, signed off  Medicare Important Message Given:    Date Medicare IM Given:    Medicare IM give by:    Date Additional Medicare IM Given:    Additional Medicare Important Message give by:     If discussed at Long Length of Stay Meetings, dates discussed:    Additional Comments:  Starlyn Skeans, RN 09/29/2014, 1:23 PM

## 2014-09-29 NOTE — Progress Notes (Signed)
ANTICOAGULATION CONSULT NOTE  Pharmacy Consult for Heparin Indication: Hx DVT  Allergies  Allergen Reactions  . Ibuprofen Other (See Comments)    MD told him not to take due to kidney disease.    Patient Measurements: Height:  (188 cm) Weight: 282 lb 14.4 oz (128.323 kg) IBW/kg (Calculated) : 82.2 Heparin Dosing Weight: 112 kg  Vital Signs: Temp: 97.7 F (36.5 C) (09/27 0449) Temp Source: Oral (09/27 0449) BP: 160/90 mmHg (09/27 0449) Pulse Rate: 103 (09/27 0449)  Labs:  Recent Labs  09/27/14 0512 09/27/14 1846 09/28/14 0352 09/29/14 0450  HGB 12.3*  --   --   --   HCT 37.1*  --   --   --   PLT 309  --   --   --   HEPARINUNFRC 0.29* 0.33 0.33 0.40  CREATININE 4.43*  --  3.85* 3.34*  CKTOTAL  --   --  573*  --     Estimated Creatinine Clearance: 40.6 mL/min (by C-G formula based on Cr of 3.34).   Medications:  Heparin @ 1950 units/hr  Assessment: 60 YOM with PMH HTN, HLD, DM2, on Xarelto PTA for history of DVT in January 2016 in the setting of nephrotic syndrome admitted with intractable nausea, vomiting with left flank area pain, hypertensive urgency, and AKI. Xarelto was held and the patient was switched to IV Heparin on 9/22 for continued anticoagulation for hx DVT and concern for renal vein thrombosis. Last dose of Xarelto 20 mg was 9/20 at ~1700.  Imaging ruled out renal vein thrombosis. MD attempting to get records regarding old DVT and recent visit to [facility] in July where extension of treatment was recommended to determine if long-term anticoagulation should be continued or is safe to d/c at this time.  Meanwhile, HL is therapeutic, CBC is stable, no s/sx bleeding. Following daily HLs  Goal of Therapy:  Heparin level 0.3-0.7 units/ml aPTT 66-102 seconds Monitor platelets by anticoagulation protocol: Yes   Plan:  Continue heparin at 2100 units/hr  Daily HL, CBC F/u longterm plan for anticoagulation Mon s/sx bleeding   Babs Bertin,  PharmD Clinical Pharmacist Pager [phone] 09/29/2014 8:54 AM

## 2014-09-29 NOTE — Progress Notes (Signed)
[facility] Progress Note    Assessment/ Plan:   1. AKI w known CKD3 / Nephrotic Syndrome Underlying: suspect ATN due to relative hypotension and severe NS secondary to diabetes. Patient had renal biopsy in July at [facility] but they did not get tissue needed to make diagnosis. Cr continues to improve, 4.43>3.85>3.34. UOP excellent with 3,315 ml over 24 hours. Will switch to PO Lasix 160 mg BID. Will need follow up with Dr. [name] in 1 week for labs. Will give discharge instructions on Lasix dosing. Likely can d/c home tomorrow.  2. Hx "DVT" on xarelto: No DVT yet confirmed, primary MD looking into this. On hep for now. 3. HTN: BP 140s-180s systolic. On Lopressor 12.5 mg BID and has Hydralazine 5 mg Q2H PRN and Lopressor 2.5 mg IV Q8H PRN for systolic BP > 180.  4. Type 2 DM: Hx retinopathy and neuropathy. Last HbA1c 10.3 on 09/21/14, but patient reports most recent HbA1c around 7.0 at his endocrinologist's. CBGs in 120s-200s in hospital. Currently on Lantus 15 units BID and sensitive ISS.   Subjective:   No acute events overnight. Patient notes the swelling in his legs is getting better. Anxious about getting home so he can go on beach trip with his family.    Objective:   BP 160/90 mmHg  Pulse 103  Temp(Src) 97.7 F (36.5 C) (Oral)  Resp 17  Ht  (1.88 m)  Wt 282 lb 14.4 oz (128.323 kg)  BMI 36.31 kg/m2  SpO2 96%  Intake/Output Summary (Last 24 hours) at 09/29/14 0814 Last data filed at 09/29/14 0600  Gross per 24 hour  Intake   1224 ml  Output   3315 ml  Net  -2091 ml   Weight change: -12 lb 1.2 oz (-5.477 kg)  Physical Exam: Gen: alert, sitting up in bed, NAD CVS: RRR, no m/g/r Resp: CTA bilaterally, breaths non-labored Abd: BS+, soft, non-tender Ext: warm, 1+ edema in lower extremities   Imaging: No results found.  Labs: BMET  Recent Labs Lab 09/23/14 0530 09/24/14 1347 09/25/14 0510 09/26/14 0500 09/27/14 0512 09/28/14 0352  09/29/14 0450  NA 130* 134* 129* 131* 137 137 136  K 3.6 4.4 4.0 4.6 4.1 4.2 3.8  CL 96* 100* 99* 99* 101 101 97*  CO2 24 22 20* 20* GLUCOSE 141* 165* 199* 283* 177* 186* 161*  BUN 53* 74* 78* 80* 69* 62* 54*  CREATININE 5.90* 6.41* 6.38* 5.27* 4.43* 3.85* 3.34*  CALCIUM 8.1* 8.1* 7.7* 8.2* 9.2 8.7* 8.7*  PHOS  --   --   --   --   --   --  5.6*   CBC  Recent Labs Lab 09/24/14 0900 09/25/14 0510 09/26/14 0500 09/27/14 0512  WBC 7.2 7.7 6.6 6.7  HGB 12.4* 11.5* 10.8* 12.3*  HCT 36.9* 33.1* 31.5* 37.1*  MCV 85.6 85.8 86.1 88.1  PLT 271 243 255 309    Medications:    . citalopram  20 mg Oral Daily  . cyclobenzaprine  10 mg Oral QHS  . gabapentin  100 mg Oral QHS  . insulin aspart  0-5 Units Subcutaneous QHS  . insulin aspart  0-9 Units Subcutaneous TID WC  . insulin glargine  15 Units Subcutaneous BID  . levothyroxine  75 mcg Oral QAC breakfast  . metoprolol tartrate  12.5 mg Oral BID      Rich Number, MD, MPH Internal Medicine Resident, PGY-II Pager: [phone]   09/29/2014, 8:14 AM

## 2014-09-30 ENCOUNTER — Inpatient Hospital Stay (HOSPITAL_COMMUNITY)
Admission: EM | Admit: 2014-09-30 | Discharge: 2014-10-03 | DRG: 684 | Disposition: A | Discharge status: Home / Self Care | Payer: Managed Care, Other (non HMO) | Attending: Internal Medicine | Admitting: Internal Medicine

## 2014-09-30 ENCOUNTER — Observation Stay (HOSPITAL_COMMUNITY): Payer: Managed Care, Other (non HMO)

## 2014-09-30 ENCOUNTER — Encounter (HOSPITAL_COMMUNITY): Payer: Self-pay | Admitting: Emergency Medicine

## 2014-09-30 ENCOUNTER — Other Ambulatory Visit: Payer: Self-pay

## 2014-09-30 DIAGNOSIS — E876 Hypokalemia: Secondary | ICD-10-CM | POA: Diagnosis present

## 2014-09-30 DIAGNOSIS — E1140 Type 2 diabetes mellitus with diabetic neuropathy, unspecified: Secondary | ICD-10-CM | POA: Diagnosis present

## 2014-09-30 DIAGNOSIS — G43A1 Cyclical vomiting, intractable: Secondary | ICD-10-CM | POA: Diagnosis not present

## 2014-09-30 DIAGNOSIS — E079 Disorder of thyroid, unspecified: Secondary | ICD-10-CM | POA: Diagnosis present

## 2014-09-30 DIAGNOSIS — E1165 Type 2 diabetes mellitus with hyperglycemia: Secondary | ICD-10-CM | POA: Diagnosis present

## 2014-09-30 DIAGNOSIS — R112 Nausea with vomiting, unspecified: Secondary | ICD-10-CM | POA: Diagnosis present

## 2014-09-30 DIAGNOSIS — N183 Chronic kidney disease, stage 3 unspecified: Secondary | ICD-10-CM

## 2014-09-30 DIAGNOSIS — R1110 Vomiting, unspecified: Secondary | ICD-10-CM

## 2014-09-30 DIAGNOSIS — Z8719 Personal history of other diseases of the digestive system: Secondary | ICD-10-CM

## 2014-09-30 DIAGNOSIS — N049 Nephrotic syndrome with unspecified morphologic changes: Secondary | ICD-10-CM | POA: Diagnosis present

## 2014-09-30 DIAGNOSIS — E11319 Type 2 diabetes mellitus with unspecified diabetic retinopathy without macular edema: Secondary | ICD-10-CM | POA: Diagnosis present

## 2014-09-30 DIAGNOSIS — R9431 Abnormal electrocardiogram [ECG] [EKG]: Secondary | ICD-10-CM

## 2014-09-30 DIAGNOSIS — Z79899 Other long term (current) drug therapy: Secondary | ICD-10-CM

## 2014-09-30 DIAGNOSIS — E785 Hyperlipidemia, unspecified: Secondary | ICD-10-CM | POA: Diagnosis present

## 2014-09-30 DIAGNOSIS — N179 Acute kidney failure, unspecified: Secondary | ICD-10-CM | POA: Diagnosis not present

## 2014-09-30 DIAGNOSIS — I129 Hypertensive chronic kidney disease with stage 1 through stage 4 chronic kidney disease, or unspecified chronic kidney disease: Secondary | ICD-10-CM | POA: Diagnosis present

## 2014-09-30 DIAGNOSIS — K219 Gastro-esophageal reflux disease without esophagitis: Secondary | ICD-10-CM | POA: Diagnosis present

## 2014-09-30 DIAGNOSIS — Z86718 Personal history of other venous thrombosis and embolism: Secondary | ICD-10-CM

## 2014-09-30 DIAGNOSIS — I10 Essential (primary) hypertension: Secondary | ICD-10-CM | POA: Diagnosis not present

## 2014-09-30 DIAGNOSIS — Z6832 Body mass index (BMI) 32.0-32.9, adult: Secondary | ICD-10-CM

## 2014-09-30 DIAGNOSIS — Z794 Long term (current) use of insulin: Secondary | ICD-10-CM

## 2014-09-30 DIAGNOSIS — E669 Obesity, unspecified: Secondary | ICD-10-CM | POA: Diagnosis present

## 2014-09-30 DIAGNOSIS — E1122 Type 2 diabetes mellitus with diabetic chronic kidney disease: Secondary | ICD-10-CM | POA: Diagnosis present

## 2014-09-30 LAB — PROTEIN ELECTROPHORESIS, SERUM
A/G Ratio: 0.7 (ref 0.7–1.7)
ALBUMIN ELP: 2.4 g/dL — AB (ref 2.9–4.4)
ALPHA-2-GLOBULIN: 1.2 g/dL — AB (ref 0.4–1.0)
Alpha-1-Globulin: 0.4 g/dL (ref 0.0–0.4)
BETA GLOBULIN: 1.1 g/dL (ref 0.7–1.3)
GAMMA GLOBULIN: 0.7 g/dL (ref 0.4–1.8)
Globulin, Total: 3.3 g/dL (ref 2.2–3.9)
Total Protein ELP: 5.7 g/dL — ABNORMAL LOW (ref 6.0–8.5)

## 2014-09-30 LAB — URINALYSIS, ROUTINE W REFLEX MICROSCOPIC
BILIRUBIN URINE: NEGATIVE
Glucose, UA: 500 mg/dL — AB
KETONES UR: 15 mg/dL — AB
Leukocytes, UA: NEGATIVE
NITRITE: NEGATIVE
Specific Gravity, Urine: 1.016 (ref 1.005–1.030)
UROBILINOGEN UA: 0.2 mg/dL (ref 0.0–1.0)
pH: 7.5 (ref 5.0–8.0)

## 2014-09-30 LAB — I-STAT TROPONIN, ED: TROPONIN I, POC: 0 ng/mL (ref 0.00–0.08)

## 2014-09-30 LAB — CBC
HEMATOCRIT: 41.9 % (ref 39.0–52.0)
Hemoglobin: 14.2 g/dL (ref 13.0–17.0)
MCH: 28.7 pg (ref 26.0–34.0)
MCHC: 33.9 g/dL (ref 30.0–36.0)
MCV: 84.8 fL (ref 78.0–100.0)
Platelets: 334 10*3/uL (ref 150–400)
RBC: 4.94 MIL/uL (ref 4.22–5.81)
RDW: 12.6 % (ref 11.5–15.5)
WBC: 7.6 10*3/uL (ref 4.0–10.5)

## 2014-09-30 LAB — COMPREHENSIVE METABOLIC PANEL
ALT: 36 U/L (ref 17–63)
ANION GAP: 13 (ref 5–15)
AST: 35 U/L (ref 15–41)
Albumin: 2.6 g/dL — ABNORMAL LOW (ref 3.5–5.0)
Alkaline Phosphatase: 72 U/L (ref 38–126)
BILIRUBIN TOTAL: 1.1 mg/dL (ref 0.3–1.2)
BUN: 47 mg/dL — AB (ref 6–20)
CO2: 27 mmol/L (ref 22–32)
Calcium: 9.2 mg/dL (ref 8.9–10.3)
Chloride: 97 mmol/L — ABNORMAL LOW (ref 101–111)
Creatinine, Ser: 3.04 mg/dL — ABNORMAL HIGH (ref 0.61–1.24)
GFR calc Af Amer: 27 mL/min — ABNORMAL LOW (ref >60)
GFR, EST NON AFRICAN AMERICAN: 24 mL/min — AB (ref >60)
Glucose, Bld: 313 mg/dL — ABNORMAL HIGH (ref 65–99)
POTASSIUM: 4.1 mmol/L (ref 3.5–5.1)
Sodium: 137 mmol/L (ref 135–145)
TOTAL PROTEIN: 6.1 g/dL — AB (ref 6.5–8.1)

## 2014-09-30 LAB — URINE MICROSCOPIC-ADD ON

## 2014-09-30 LAB — UIFE/LIGHT CHAINS/TP QN, 24-HR UR
% BETA, URINE: 14.1 %
ALPHA 1 URINE: 12.8 %
ALPHA 2 UR: 7.7 %
Albumin, U: 58.4 %
FREE KAPPA/LAMBDA RATIO: 4.02 (ref 2.04–10.37)
FREE LAMBDA LT CHAINS, UR: 33.8 mg/L — AB (ref 0.24–6.66)
Free Lt Chn Excr Rate: 136 mg/L — ABNORMAL HIGH (ref 1.35–24.19)
GAMMA GLOBULIN URINE: 6.9 %
Total Protein, Urine: 552.7 mg/dL

## 2014-09-30 LAB — RAPID URINE DRUG SCREEN, HOSP PERFORMED
Amphetamines: NOT DETECTED
BENZODIAZEPINES: NOT DETECTED
Barbiturates: NOT DETECTED
Cocaine: NOT DETECTED
Opiates: POSITIVE — AB
Tetrahydrocannabinol: NOT DETECTED

## 2014-09-30 LAB — CBG MONITORING, ED
GLUCOSE-CAPILLARY: 306 mg/dL — AB (ref 65–99)
Glucose-Capillary: 323 mg/dL — ABNORMAL HIGH (ref 65–99)

## 2014-09-30 LAB — LIPASE, BLOOD: Lipase: 16 U/L — ABNORMAL LOW (ref 22–51)

## 2014-09-30 LAB — GLUCOSE, CAPILLARY: GLUCOSE-CAPILLARY: 247 mg/dL — AB (ref 65–99)

## 2014-09-30 LAB — MAGNESIUM: MAGNESIUM: 1.7 mg/dL (ref 1.7–2.4)

## 2014-09-30 MED ORDER — PANTOPRAZOLE SODIUM 40 MG IV SOLR
40.0000 mg | Freq: Two times a day (BID) | INTRAVENOUS | Status: DC
  Administered 2014-09-30 – 2014-10-03 (×7): 40 mg via INTRAVENOUS
  Filled 2014-09-30 (×7): qty 40

## 2014-09-30 MED ORDER — ENOXAPARIN SODIUM 40 MG/0.4ML ~~LOC~~ SOLN
40.0000 mg | SUBCUTANEOUS | Status: DC
  Administered 2014-09-30 – 2014-10-02 (×3): 40 mg via SUBCUTANEOUS
  Filled 2014-09-30 (×3): qty 0.4

## 2014-09-30 MED ORDER — PROMETHAZINE HCL 25 MG/ML IJ SOLN
25.0000 mg | Freq: Once | INTRAMUSCULAR | Status: AC
  Administered 2014-09-30: 25 mg via INTRAMUSCULAR
  Filled 2014-09-30 (×2): qty 1

## 2014-09-30 MED ORDER — LORAZEPAM 2 MG/ML IJ SOLN
1.0000 mg | Freq: Once | INTRAMUSCULAR | Status: AC
  Administered 2014-09-30: 1 mg via INTRAVENOUS
  Filled 2014-09-30: qty 1

## 2014-09-30 MED ORDER — SODIUM CHLORIDE 0.9 % IV SOLN
INTRAVENOUS | Status: DC
  Administered 2014-09-30 – 2014-10-02 (×3): via INTRAVENOUS
  Administered 2014-10-03: 100 mL/h via INTRAVENOUS
  Administered 2014-10-03: 01:00:00 via INTRAVENOUS

## 2014-09-30 MED ORDER — HYDROMORPHONE HCL 1 MG/ML IJ SOLN
0.5000 mg | Freq: Once | INTRAMUSCULAR | Status: AC
  Administered 2014-09-30: 0.5 mg via INTRAVENOUS
  Filled 2014-09-30: qty 1

## 2014-09-30 MED ORDER — INSULIN ASPART 100 UNIT/ML ~~LOC~~ SOLN
0.0000 [IU] | SUBCUTANEOUS | Status: DC
  Administered 2014-09-30: 7 [IU] via SUBCUTANEOUS
  Administered 2014-09-30: 3 [IU] via SUBCUTANEOUS
  Filled 2014-09-30: qty 1

## 2014-09-30 MED ORDER — METOPROLOL TARTRATE 1 MG/ML IV SOLN
5.0000 mg | Freq: Once | INTRAVENOUS | Status: AC
  Administered 2014-09-30: 5 mg via INTRAVENOUS
  Filled 2014-09-30: qty 5

## 2014-09-30 MED ORDER — ONDANSETRON HCL 4 MG/2ML IJ SOLN
4.0000 mg | Freq: Four times a day (QID) | INTRAMUSCULAR | Status: DC | PRN
  Administered 2014-09-30 – 2014-10-02 (×4): 4 mg via INTRAVENOUS
  Filled 2014-09-30 (×3): qty 2

## 2014-09-30 MED ORDER — LORAZEPAM 2 MG/ML IJ SOLN
0.5000 mg | Freq: Two times a day (BID) | INTRAMUSCULAR | Status: DC | PRN
  Administered 2014-09-30 – 2014-10-01 (×4): 0.5 mg via INTRAVENOUS
  Filled 2014-09-30 (×4): qty 1

## 2014-09-30 MED ORDER — HYDROMORPHONE HCL 1 MG/ML IJ SOLN
0.5000 mg | INTRAMUSCULAR | Status: DC | PRN
  Administered 2014-10-01 – 2014-10-02 (×5): 0.5 mg via INTRAVENOUS
  Filled 2014-09-30 (×5): qty 1

## 2014-09-30 MED ORDER — METOCLOPRAMIDE HCL 5 MG/ML IJ SOLN
10.0000 mg | Freq: Once | INTRAMUSCULAR | Status: AC
  Administered 2014-09-30: 10 mg via INTRAVENOUS
  Filled 2014-09-30: qty 2

## 2014-09-30 MED ORDER — ACETAMINOPHEN 650 MG RE SUPP: 650.0000 mg | Freq: Four times a day (QID) | RECTAL | Status: DC | PRN

## 2014-09-30 MED ORDER — SODIUM CHLORIDE 0.9 % IV BOLUS (SEPSIS)
1000.0000 mL | Freq: Once | INTRAVENOUS | Status: AC
  Administered 2014-09-30: 1000 mL via INTRAVENOUS

## 2014-09-30 MED ORDER — METOCLOPRAMIDE HCL 5 MG/ML IJ SOLN
5.0000 mg | Freq: Two times a day (BID) | INTRAMUSCULAR | Status: DC
  Administered 2014-09-30 – 2014-10-03 (×6): 5 mg via INTRAVENOUS
  Filled 2014-09-30 (×6): qty 2

## 2014-09-30 MED ORDER — HYDRALAZINE HCL 20 MG/ML IJ SOLN
10.0000 mg | Freq: Four times a day (QID) | INTRAMUSCULAR | Status: DC | PRN
  Administered 2014-09-30 – 2014-10-02 (×4): 10 mg via INTRAVENOUS
  Filled 2014-09-30 (×4): qty 1

## 2014-09-30 MED ORDER — METOCLOPRAMIDE HCL 5 MG/ML IJ SOLN
10.0000 mg | INTRAMUSCULAR | Status: DC
  Administered 2014-09-30: 10 mg via INTRAVENOUS
  Filled 2014-09-30: qty 2

## 2014-09-30 MED ORDER — DEXTROSE 5 % IV SOLN
1.0000 g | Freq: Once | INTRAVENOUS | Status: AC
  Administered 2014-09-30: 1 g via INTRAVENOUS
  Filled 2014-09-30: qty 10

## 2014-09-30 MED ORDER — INSULIN GLARGINE 100 UNIT/ML ~~LOC~~ SOLN
15.0000 [IU] | Freq: Two times a day (BID) | SUBCUTANEOUS | Status: DC
  Administered 2014-09-30 – 2014-10-01 (×2): 15 [IU] via SUBCUTANEOUS
  Filled 2014-09-30 (×3): qty 0.15

## 2014-09-30 MED ORDER — ONDANSETRON HCL 4 MG PO TABS
4.0000 mg | ORAL_TABLET | Freq: Four times a day (QID) | ORAL | Status: DC | PRN
  Filled 2014-09-30: qty 1

## 2014-09-30 MED ORDER — DIPHENHYDRAMINE HCL 50 MG/ML IJ SOLN
25.0000 mg | Freq: Once | INTRAMUSCULAR | Status: AC
  Administered 2014-09-30: 25 mg via INTRAVENOUS
  Filled 2014-09-30: qty 1

## 2014-09-30 MED ORDER — INSULIN ASPART 100 UNIT/ML ~~LOC~~ SOLN
0.0000 [IU] | Freq: Three times a day (TID) | SUBCUTANEOUS | Status: DC
  Administered 2014-10-01: 3 [IU] via SUBCUTANEOUS
  Administered 2014-10-01: 5 [IU] via SUBCUTANEOUS
  Administered 2014-10-01: 3 [IU] via SUBCUTANEOUS
  Administered 2014-10-02: 2 [IU] via SUBCUTANEOUS
  Administered 2014-10-02 – 2014-10-03 (×2): 1 [IU] via SUBCUTANEOUS
  Administered 2014-10-03: 2 [IU] via SUBCUTANEOUS

## 2014-09-30 MED ORDER — IOHEXOL 300 MG/ML  SOLN
25.0000 mL | INTRAMUSCULAR | Status: AC
  Administered 2014-09-30 (×2): 25 mL via ORAL

## 2014-09-30 MED ORDER — ACETAMINOPHEN 325 MG PO TABS
650.0000 mg | ORAL_TABLET | Freq: Four times a day (QID) | ORAL | Status: DC | PRN
  Administered 2014-10-02 – 2014-10-03 (×2): 650 mg via ORAL
  Filled 2014-09-30 (×2): qty 2

## 2014-09-30 NOTE — ED Notes (Signed)
No vomiting since meds.

## 2014-09-30 NOTE — ED Notes (Addendum)
PT pacing around room completely naked. Pt requesting assistance with cleaning. Pt provided with wet washcloths to clean up. Bedpan emptied. Pt refuses gown and requests to be covered with sheet in bed.

## 2014-09-30 NOTE — H&P (Signed)
[facility] History and Physical  Russell [name] ZOX:[id number] DOB: Nov 29, 1970 DOA: 09/30/2014  Referring physician: Emergency Department PCP: [name], MD   Chief Complaint:  Nausea and vomiting  HPI: Russell [name] is a 44 y.o. male with multiple medical problems who was discharged from the hospital yesterday after a 6 day stay for nausea / vomiting / acute on chronic renal failure. Patient reported flank pain, a non-contrast CTscan suggested mild renal inflammation. He received a dose of Rocephin on 09/22/14 but no additional antibiotics were given since patient was afebrile with normal WBC.    Patient developed recurrent vomiting (bilious emesis) early this am. No abdominal pain, just discomfort associated with vomiting / retching. No diarrhea. Patient reports fevers / chills at home today but doesn't know what temp was at home.   Patient takes hydrocodone, neurontin and tylenol for peripheral neuropathy. These are chronic meds. He had a normal gastric emptying in April of this year. States CBG usually run less than 200 at home.   In ED patient is tachycardic at 110. BP elevated at 204/104. Glucose 313. Renal function actually improved with BUN 47 / 3.04.   Review of Systems:  Constitutional:  No weight loss, night sweats, fatigue.  HEENT:  ++ headaches, No Difficulty swallowing,Tooth/dental problems,Sore throat,  No sneezing, itching, ear ache, nasal congestion, post nasal drip,  Cardio-vascular:  No chest pain, Orthopnea, PND, swelling in lower extremities, anasarca, dizziness, palpitations  GI:  No heartburn, indigestion, abdominal pain, diarrhea, change in bowel habits, loss of appetite  Resp:  No shortness of breath with exertion or at rest. No excess mucus, no productive cough, No non-productive cough, No coughing up of blood.No change in color of mucus.No wheezing.No chest wall deformity  Skin:  no rash or lesions.  GU:  no dysuria, change in color of urine,  no urgency or frequency. No flank pain.  Musculoskeletal:  No joint pain or swelling. No decreased range of motion. No back pain.  Psych:  No change in mood or affect. No depression or anxiety. No memory loss.   Past Medical History  Diagnosis Date  . Thyroid disease   . Diabetes mellitus without complication   . Hypertension   . Nephrotic syndrome 05/18/2014  . DVT (deep venous thrombosis), H/o 01/2014-on Xarelto 03/24/2014  . Secondary DM with DKA-AG=16, BIcarb Nl 03/24/2014   Past Surgical History  Procedure Laterality Date  . Right ankle fx repair    . Eye surgery    . Esophagogastroduodenoscopy N/A 03/29/2014    Procedure: ESOPHAGOGASTRODUODENOSCOPY (EGD);  Surgeon: Dorena Cookey, MD;  Location: [facility];  Service: Endoscopy;  Laterality: N/A;  . Tonsillectomy and adenoidectomy     Social History:  reports that he has never smoked. He has never used smokeless tobacco. He reports that he does not drink alcohol or use illicit drugs.   Lives: at home with family.  Assistive devices: none needed  Allergies  Allergen Reactions  . Ibuprofen Other (See Comments)    MD told him not to take due to kidney disease.    Family History  Problem Relation Age of Onset  . Obesity Mother     Patient states that family members have no other medical illnesses other than what I have described  . Heart disease      No family history  . Cancer Father 40    AML    Prior to Admission medications   Medication Sig Start Date End Date Taking? Authorizing Provider  acetaminophen (  TYLENOL) 500 MG tablet Take 1 tablet (500 mg total) by mouth every 6 (six) hours as needed for moderate pain. 09/29/14   Albertine Grates, MD  albuterol (PROVENTIL HFA;VENTOLIN HFA) 108 (90 BASE) MCG/ACT inhaler Inhale 1 puff into the lungs every 6 (six) hours as needed for wheezing or shortness of breath.    Historical Provider, MD  albuterol (PROVENTIL) (2.5 MG/3ML) 0.083% nebulizer solution Take 3 mLs (2.5 mg total) by  nebulization every 6 (six) hours as needed for wheezing or shortness of breath. 09/03/14   [name], MD  albuterol-ipratropium (COMBIVENT) 18-103 MCG/ACT inhaler Inhale 2 puffs into the lungs every 4 (four) hours. 09/03/14   [name], MD  ALPRAZolam Prudy Feeler) 0.5 MG tablet TAKE 1 TABLET BY MOUTH TWICE A DAY AS NEEDED FOR SLEEP 08/11/14   Wallis Bamberg, PA-C  citalopram (CELEXA) 20 MG tablet Take 1 tablet (20 mg total) by mouth daily. TAKE 1 TABLET (20 MG TOTAL) BY MOUTH DAILY    "OV NEEDED FOR REFILLS" 09/02/14   Chelle Jeffery, PA-C  cyclobenzaprine (FLEXERIL) 10 MG tablet Take 1 tablet (10 mg total) by mouth at bedtime. 08/11/14   Wallis Bamberg, PA-C  furosemide (LASIX) 80 MG tablet Take 2 tablets (160 mg total) by mouth 2 (two) times daily. 09/29/14   Albertine Grates, MD  gabapentin (NEURONTIN) 100 MG capsule Take 1 capsule (100 mg total) by mouth at bedtime. 09/29/14   Albertine Grates, MD  HYDROcodone-acetaminophen (NORCO/VICODIN) 5-325 MG per tablet Take 1 tablet by mouth every 6 (six) hours as needed for moderate pain. 09/03/14   [name], MD  insulin glargine (LANTUS) 100 UNIT/ML injection Inject 0.15 mLs (15 Units total) into the skin 2 (two) times daily. Patient taking differently: Inject 30 Units into the skin daily.  04/06/14   Leana Roe Elgergawy, MD  Insulin Lispro, Human, (HUMALOG KWIKPEN) 200 UNIT/ML SOPN Inject 33 Units into the skin 3 (three) times daily before meals. Patient taking differently: Inject 15-80 Units into the skin 3 (three) times daily before meals.  07/31/14   Carlus Pavlov, MD  levothyroxine (SYNTHROID, LEVOTHROID) 50 MCG tablet Take 1 tablet (50 mcg total) by mouth daily. 05/02/13   [name], MD  metoprolol succinate (TOPROL-XL) 100 MG 24 hr tablet Take 1 tablet (100 mg total) by mouth daily. 05/20/14   Lewayne Bunting, MD  promethazine (PHENERGAN) 25 MG tablet Take 1 tablet (25 mg total) by mouth every 6 (six) hours as needed for nausea. 09/03/14   [name], MD  traMADol  (ULTRAM) 50 MG tablet Take 1 tablet (50 mg total) by mouth every 8 (eight) hours as needed. 05/26/14   [name], MD   Physical Exam: Filed Vitals:   09/30/14 1100 09/30/14 1102 09/30/14 1138  BP: 174/89 155/81 150/73  Pulse: 110 111 103  Temp:  97.5 F (36.4 C)   TempSrc:  Oral   Resp: Height:  (1.88 m)    Weight: 127.914 kg (282 lb)    SpO2: 97% 97% 95%    Wt Readings from Last 3 Encounters:  09/30/14 127.914 kg (282 lb)  09/28/14 128.323 kg (282 lb 14.4 oz)  09/25/14 133.811 kg (295 lb)    General:  Bbese, white male. Appears calm and comfortable Eyes: PERRL, normal lids, irises & conjunctiva ENT: grossly normal hearing, lips & tongue Neck: no LAD, masses or thyromegaly Cardiovascular: Tachy at 110. no m/r/g. No LE edema.  Respiratory: CTA bilaterally, no w/r/r. Normal respiratory effort. Abdomen:  soft, ntnd Skin: no rash or induration seen on limited exam. Superficial scratches on BLE extremites Musculoskeletal: grossly normal tone BUE/BLE Psychiatric: grossly normal mood and affect, speech fluent and appropriate Neurologic: grossly non-focal.          EGD March 2016 - severe erosive esophagitis.   Labs on Admission:  Basic Metabolic Panel:  Recent Labs Lab 09/26/14 0500 09/27/14 0512 09/28/14 0352 09/29/14 0450 09/30/14 1100  NA 131* 137 137 136 137  K 4.6 4.1 4.2 3.8 4.1  CL 99* 101 101 97* 97*  CO2 20* 26 28 30 27   GLUCOSE 283* 177* 186* 161* 313*  BUN 80* 69* 62* 54* 47*  CREATININE 5.27* 4.43* 3.85* 3.34* 3.04*  CALCIUM 8.2* 9.2 8.7* 8.7* 9.2  PHOS  --   --   --  5.6*  --    Liver Function Tests:  Recent Labs Lab 09/29/14 0450 09/30/14 1100  AST  --  35  ALT  --  36  ALKPHOS  --  72  BILITOT  --  1.1  PROT  --  6.1*  ALBUMIN 2.2* 2.6*    Recent Labs Lab 09/30/14 1100  LIPASE 16*    CBC:  Recent Labs Lab 09/24/14 0900 09/25/14 0510 09/26/14 0500 09/27/14 0512 09/30/14 1100  WBC 7.2 7.7 6.6 6.7 7.6  HGB  12.4* 11.5* 10.8* 12.3* 14.2  HCT 36.9* 33.1* 31.5* 37.1* 41.9  MCV 85.6 85.8 86.1 88.1 84.8  PLT 271 243 255 309 334   Cardiac Enzymes:  Recent Labs Lab 09/25/14 0510 09/26/14 0500 09/28/14 0352  CKTOTAL 1949* 1606* 573*    CBG:  Recent Labs Lab 09/28/14 2116 09/29/14 0801 09/29/14 1215 09/29/14 1645 09/30/14 1056  GLUCAP 217* 129* 185* 243* 306*    EKG: Sinus tachycardia Prolonged QT interval - Baseline wander in lead(s) V2Independently reviewed.  Assessment/Plan Principal Problem:   Nausea with vomiting Active Problems:   Hypertension   Diabetes   Hx of gastroesophageal reflux (GERD)   Prolonged Q-T interval on ECG   CKD (chronic kidney disease), stage III  Nausea / vomiting.  He was just discharged yesterday following a week long stay for nausea, vomiting and acute on chronic renal failure. Despite normal gastric emptying in April I still wonder about delayed gastric emptying, especially given that hgb a1c was 10.3 last month.   Will admit to observation for symptom management.   Renal dose IV Reglan.   IV ativan BID  Obtain non-contrast CTscan to follow up on perinephric stranding on recent CTscan.   U/A is pending. Rocephin x 1 pending u/a  Diabetes. Uncontrolled. Recent hgb a1c 10.3  Continue home BID Lantus  SSI Q 4 hours  Consult Diabetes Coordinator  Hypertension.    Continue home Toprol.   Hold home diuretics.   Add prn IV hydralazine.   Nephrotic Syndrome / CKD. Stage 3. Renal function actually improved since recent hospital discharge.   Hold nephrotoxic medications.   Monitor labs.   Prolonged QT interval on EGC.   Serum potassium normal.   Will check Mg level.   Hold medications which can cause QT prolongation.     Continue telemetry.   Hx of GERD. Severe esophagitis on EGD in March 2016. Not on anti-reflux regimen at home. Start BID IV PPI.    Code Status: Full code DVT Prophylaxis: Family Communication:  Patient alert, oriented and understands plan of care.  Disposition Plan:  Discharge in 24-48 hours.   Time spent: 60 minutes  Willette Cluster [facility] Pager [phone]

## 2014-09-30 NOTE — ED Notes (Signed)
To ED via [facility] from home, lives with fiance-- mother and father accompanied to ED-- pt c/o vomiting, headache, diaphoresis, --  Was d/c'd from hospital last night.  EMS gave Zofran  without relief-- pt states only phenergan works when he is vomiting like this.

## 2014-09-30 NOTE — ED Notes (Signed)
hospitalist at bedside

## 2014-09-30 NOTE — ED Provider Notes (Signed)
CSN: [id number]     Arrival date & time 09/30/14  1054 History   First MD Initiated Contact with Patient 09/30/14 1150     Chief Complaint  Patient presents with  . Emesis  . Headache  . Hypertension     (Consider location/radiation/quality/duration/timing/severity/associated sxs/prior Treatment) The history is provided by the patient and a parent. No language interpreter was used.     The patient is a 44 year old male with history of diabetes, CKD stage IV, nephrotic syndrome, hypertension, presents to the emergency room with complaints of vomiting, hypertension and headache, he has been unable to take any of his medications and he was discharged from the hospital last night. His recent admission began similarly, on September 19. Throughout his stay he had nausea, vomiting, abdominal pain and had acute on chronic renal failure. During his stay MRI of his abdomen and pelvis showed adequate perfusion to his kidneys and bowel. In the past he had a normal gastric emptying study. He has had multiple episodes today showing no acute pathology in the abdomen. He was reportedly discharged yesterday and his pain and vomiting began become more severe at approximately 7 PM last night, was associated with epigastric abdominal pain, which is similar to his previous episodes of intractable nausea vomiting and abdominal pain. He also had intermittent headache and is extremely hypertensive. Patient was unable to take any of his home medications.  He reports elevated sugars last night and this morning although he has not treated with his insulin. Patient denies any shortness of breath, chest pain, swelling in his legs. He has no other acute issues at this time, is unable to answer all questions due to retching and vomiting in the ER.  Past Medical History  Diagnosis Date  . Thyroid disease   . Diabetes mellitus without complication   . Hypertension   . Nephrotic syndrome 05/18/2014  . DVT (deep venous  thrombosis), H/o 01/2014-on Xarelto 03/24/2014  . Secondary DM with DKA-AG=16, BIcarb Nl 03/24/2014   Past Surgical History  Procedure Laterality Date  . Right ankle fx repair    . Eye surgery    . Esophagogastroduodenoscopy N/A 03/29/2014    Procedure: ESOPHAGOGASTRODUODENOSCOPY (EGD);  Surgeon: Dorena Cookey, MD;  Location: [facility];  Service: Endoscopy;  Laterality: N/A;  . Tonsillectomy and adenoidectomy     Family History  Problem Relation Age of Onset  . Obesity Mother     Patient states that family members have no other medical illnesses other than what I have described  . Heart disease      No family history  . Cancer Father 60    AML   Social History  Substance Use Topics  . Smoking status: Never Smoker   . Smokeless tobacco: Never Used  . Alcohol Use: No    Review of Systems  10 Systems reviewed and are negative for acute change except as noted in the HPI.   Allergies  Ibuprofen  Home Medications   Prior to Admission medications   Medication Sig Start Date End Date Taking? Authorizing Provider  acetaminophen (TYLENOL) 500 MG tablet Take 1 tablet (500 mg total) by mouth every 6 (six) hours as needed for moderate pain. 09/29/14  Yes Albertine Grates, MD  albuterol (PROVENTIL HFA;VENTOLIN HFA) 108 (90 BASE) MCG/ACT inhaler Inhale 1 puff into the lungs every 6 (six) hours as needed for wheezing or shortness of breath.   Yes Historical Provider, MD  albuterol (PROVENTIL) (2.5 MG/3ML) 0.083% nebulizer solution Take 3 mLs (2.5  mg total) by nebulization every 6 (six) hours as needed for wheezing or shortness of breath. 09/03/14  Yes [name], MD  albuterol-ipratropium (COMBIVENT) 18-103 MCG/ACT inhaler Inhale 2 puffs into the lungs every 4 (four) hours. 09/03/14  Yes [name], MD  ALPRAZolam Prudy Feeler) 0.5 MG tablet TAKE 1 TABLET BY MOUTH TWICE A DAY AS NEEDED FOR SLEEP 08/11/14  Yes Wallis Bamberg, PA-C  citalopram (CELEXA) 20 MG tablet Take 1 tablet (20 mg total) by mouth daily.  TAKE 1 TABLET (20 MG TOTAL) BY MOUTH DAILY    "OV NEEDED FOR REFILLS" 09/02/14  Yes Chelle Jeffery, PA-C  cyclobenzaprine (FLEXERIL) 10 MG tablet Take 1 tablet (10 mg total) by mouth at bedtime. 08/11/14  Yes Wallis Bamberg, PA-C  furosemide (LASIX) 80 MG tablet Take 2 tablets (160 mg total) by mouth 2 (two) times daily. 09/29/14  Yes Albertine Grates, MD  gabapentin (NEURONTIN) 100 MG capsule Take 1 capsule (100 mg total) by mouth at bedtime. 09/29/14  Yes Albertine Grates, MD  HYDROcodone-acetaminophen (NORCO/VICODIN) 5-325 MG per tablet Take 1 tablet by mouth every 6 (six) hours as needed for moderate pain. 09/03/14  Yes [name], MD  insulin glargine (LANTUS) 100 UNIT/ML injection Inject 0.15 mLs (15 Units total) into the skin 2 (two) times daily. Patient taking differently: Inject 30 Units into the skin daily.  04/06/14  Yes Starleen Arms, MD  Insulin Lispro, Human, (HUMALOG KWIKPEN) 200 UNIT/ML SOPN Inject 33 Units into the skin 3 (three) times daily before meals. Patient taking differently: Inject 15-80 Units into the skin 3 (three) times daily before meals. Per sliding scale 07/31/14  Yes Carlus Pavlov, MD  levothyroxine (SYNTHROID, LEVOTHROID) 50 MCG tablet Take 1 tablet (50 mcg total) by mouth daily. 05/02/13  Yes [name], MD  metoprolol succinate (TOPROL-XL) 100 MG 24 hr tablet Take 1 tablet (100 mg total) by mouth daily. 05/20/14  Yes Lewayne Bunting, MD  promethazine (PHENERGAN) 25 MG tablet Take 1 tablet (25 mg total) by mouth every 6 (six) hours as needed for nausea. 09/03/14  Yes [name], MD  traMADol (ULTRAM) 50 MG tablet Take 1 tablet (50 mg total) by mouth every 8 (eight) hours as needed. Patient taking differently: Take 50 mg by mouth every 8 (eight) hours as needed for moderate pain.  05/26/14  Yes [name], MD   BP 193/102 mmHg  Pulse 101  Temp(Src) 97.5 F (36.4 C) (Oral)  Resp 21  Ht  (1.88 m)  Wt 282 lb (127.914 kg)  BMI 36.19 kg/m2  SpO2 93% Physical Exam   Constitutional: He is oriented to person, place, and time. He appears well-developed and well-nourished. No distress.  Obese male appears stated age, retching and rolling around in the ER gurney  HENT:  Head: Normocephalic and atraumatic.  Nose: Nose normal.  Mouth/Throat: Oropharynx is clear and moist. No oropharyngeal exudate.  Eyes: Conjunctivae and EOM are normal. Pupils are equal, round, and reactive to light. Right eye exhibits no discharge. Left eye exhibits no discharge. No scleral icterus.  Neck: Normal range of motion. No JVD present. No tracheal deviation present. No thyromegaly present.  Cardiovascular: Regular rhythm, normal heart sounds and intact distal pulses.  Exam reveals no gallop and no friction rub.   No murmur heard. Tachycardic  Pulmonary/Chest: Effort normal and breath sounds normal. No respiratory distress. He has no wheezes. He has no rales. He exhibits no tenderness.  Abdominal: Soft. Bowel sounds are normal. He exhibits no distension and  no mass. There is tenderness. There is no rebound and no guarding.  Generalized tenderness to palpation, worse in the epigastric area with mild guarding, frequent retching but no emesis produced  Musculoskeletal: Normal range of motion. He exhibits no edema or tenderness.  Lymphadenopathy:    He has no cervical adenopathy.  Neurological: He is alert and oriented to person, place, and time. He has normal reflexes. No cranial nerve deficit. He exhibits normal muscle tone. Coordination normal.  Skin: Skin is warm and dry. No rash noted. He is not diaphoretic. No erythema. No pallor.  Psychiatric: Judgment and thought content normal.  Nursing note and vitals reviewed.   ED Course  Procedures (including critical care time) Labs Review Labs Reviewed  LIPASE, BLOOD - Abnormal; Notable for the following:    Lipase 16 (*)    All other components within normal limits  COMPREHENSIVE METABOLIC PANEL - Abnormal; Notable for the  following:    Chloride 97 (*)    Glucose, Bld 313 (*)    BUN 47 (*)    Creatinine, Ser 3.04 (*)    Total Protein 6.1 (*)    Albumin 2.6 (*)    GFR calc non Af Amer 24 (*)    GFR calc Af Amer 27 (*)    All other components within normal limits  CBG MONITORING, ED - Abnormal; Notable for the following:    Glucose-Capillary 306 (*)    All other components within normal limits  CBG MONITORING, ED - Abnormal; Notable for the following:    Glucose-Capillary 323 (*)    All other components within normal limits  CBC  MAGNESIUM  URINALYSIS, ROUTINE W REFLEX MICROSCOPIC (NOT AT [facility])  URINE RAPID DRUG SCREEN, HOSP PERFORMED  COMPREHENSIVE METABOLIC PANEL  I-STAT TROPOININ, ED    Imaging Review No results found. I have personally reviewed and evaluated these images and lab results as part of my medical decision-making.   EKG Interpretation   Date/Time:  Wednesday September 30 2014 10:53:27 EDT Ventricular Rate:  108 PR Interval:  163 QRS Duration: 88 QT Interval:  381 QTC Calculation: 511 R Axis:   34 Text Interpretation:  Sinus tachycardia Prolonged QT interval Baseline  wander in lead(s) V2 Confirmed by Lincoln Brigham [phone]) on 09/30/2014 2:24:25  PM      MDM   Final diagnoses:  Intractable vomiting with nausea, vomiting of unspecified type    Patient was recently discharged from the hospital for the same symptoms he presents for today he has been unable to take his medications, N/V began last night. With CKD stage 4/ARF, hyperglycemia, HTN.  I suspect patient will need admission for further treatment and better control of N/V.   The patient had frequent retching, I did not observe him having any actual emesis. He's states that his nausea and vomiting not controlled with Zofran or Phenergan. He had some brief relief with Reglan, Benadryl and Ativan.  During that time his blood pressure was systolic in the 150s, however he did remain mildly tachycardic.    Patient lab results  are significant for hyperglycemia, elevated BUN and creatinine, although decreasing from his labs from yesterday, currently CK stage IV, the patient does not have a white count for troponin is negative, EKG reveals sinus tachycardia.  Try to hospitalist was called for admission. They will come and see the patient here in the ER to assess and disposition.  Spoke with Willette Cluster, NP, Attending physician - Dr. Susie Cassette   Patient was admitted for  hyperglycemia, nausea and vomiting        Danelle Berry, PA-C 10/02/14 0418  Tilden Fossa, MD 10/03/14 [phone]

## 2014-10-01 DIAGNOSIS — I4581 Long QT syndrome: Secondary | ICD-10-CM

## 2014-10-01 DIAGNOSIS — N183 Chronic kidney disease, stage 3 (moderate): Secondary | ICD-10-CM

## 2014-10-01 DIAGNOSIS — R112 Nausea with vomiting, unspecified: Secondary | ICD-10-CM | POA: Diagnosis not present

## 2014-10-01 DIAGNOSIS — I10 Essential (primary) hypertension: Secondary | ICD-10-CM | POA: Diagnosis not present

## 2014-10-01 DIAGNOSIS — Z8719 Personal history of other diseases of the digestive system: Secondary | ICD-10-CM

## 2014-10-01 LAB — COMPREHENSIVE METABOLIC PANEL
ALT: 33 U/L (ref 17–63)
ANION GAP: 12 (ref 5–15)
AST: 34 U/L (ref 15–41)
Albumin: 2.4 g/dL — ABNORMAL LOW (ref 3.5–5.0)
Alkaline Phosphatase: 66 U/L (ref 38–126)
BUN: 46 mg/dL — ABNORMAL HIGH (ref 6–20)
CHLORIDE: 97 mmol/L — AB (ref 101–111)
CO2: 28 mmol/L (ref 22–32)
CREATININE: 3.2 mg/dL — AB (ref 0.61–1.24)
Calcium: 8.7 mg/dL — ABNORMAL LOW (ref 8.9–10.3)
GFR, EST AFRICAN AMERICAN: 26 mL/min — AB (ref >60)
GFR, EST NON AFRICAN AMERICAN: 22 mL/min — AB (ref >60)
Glucose, Bld: 212 mg/dL — ABNORMAL HIGH (ref 65–99)
POTASSIUM: 3.5 mmol/L (ref 3.5–5.1)
SODIUM: 137 mmol/L (ref 135–145)
Total Bilirubin: 0.8 mg/dL (ref 0.3–1.2)
Total Protein: 5.6 g/dL — ABNORMAL LOW (ref 6.5–8.1)

## 2014-10-01 LAB — GLUCOSE, CAPILLARY
GLUCOSE-CAPILLARY: 210 mg/dL — AB (ref 65–99)
GLUCOSE-CAPILLARY: 215 mg/dL — AB (ref 65–99)
Glucose-Capillary: 246 mg/dL — ABNORMAL HIGH (ref 65–99)
Glucose-Capillary: 257 mg/dL — ABNORMAL HIGH (ref 65–99)

## 2014-10-01 MED ORDER — MAGNESIUM SULFATE 2 GM/50ML IV SOLN
2.0000 g | Freq: Once | INTRAVENOUS | Status: AC
  Administered 2014-10-01: 2 g via INTRAVENOUS
  Filled 2014-10-01: qty 50

## 2014-10-01 MED ORDER — INSULIN GLARGINE 100 UNIT/ML ~~LOC~~ SOLN
16.0000 [IU] | Freq: Two times a day (BID) | SUBCUTANEOUS | Status: DC
  Administered 2014-10-01 – 2014-10-03 (×4): 16 [IU] via SUBCUTANEOUS
  Filled 2014-10-01 (×7): qty 0.16

## 2014-10-01 MED ORDER — HYDRALAZINE HCL 20 MG/ML IJ SOLN
10.0000 mg | Freq: Once | INTRAMUSCULAR | Status: AC
  Administered 2014-10-01: 10 mg via INTRAVENOUS
  Filled 2014-10-01: qty 1

## 2014-10-01 NOTE — Progress Notes (Signed)
Nutrition Brief Note  Patient identified on the Malnutrition Screening Tool (MST) Report  Wt Readings from Last 15 Encounters:  09/30/14 243 lb 2.7 oz (110.3 kg)  09/28/14 282 lb 14.4 oz (128.323 kg)  09/25/14 295 lb (133.811 kg)  09/03/14 276 lb (125.193 kg)  08/04/14 253 lb (114.76 kg)  07/31/14 272 lb 9.6 oz (123.651 kg)  06/08/14 259 lb 14.8 oz (117.9 kg)  06/03/14 262 lb (118.842 kg)  05/26/14 258 lb 6 oz (117.198 kg)  05/20/14 262 lb (118.842 kg)  05/18/14 266 lb (120.657 kg)  04/23/14 263 lb 6.4 oz (119.477 kg)  04/22/14 260 lb 6.4 oz (118.117 kg)  04/18/14 261 lb (118.389 kg)  04/10/14 263 lb 6.4 oz (119.477 kg)   Russell [name] is a 44 y.o. male with multiple medical problems who was discharged from the hospital yesterday after a 6 day stay for nausea / vomiting / acute on chronic renal failure. Patient reported flank pain, a non-contrast CTscan suggested mild renal inflammation. He received a dose of Rocephin on 09/22/14 but no additional antibiotics were given since patient was afebrile with normal WBC.  Pt declined interview and exam, due to not feeling well, however, gave permission to obtain hx from pt wife at bedside.   Pt wife reveals that appetite is very good at baseline and intake was only poor 1-2 days PTA due to nausea and vomiting ("his blood pressure has been messed up"). He did not take the clear liquid tray this AM, due to not feeling well. Pt wife is confident that pt will start eating once nausea and vomiting have been resolved.   Pt wife reports that pt has been gaining weight PTA. She reveals any weight changes are likely due to lasix. Noted a 39# wt loss over the past days, per wt records. Per wife, pt was weighing between 285-295# PTA and suspects current wt is inaccurate.   Both pt and wife decline any further nutritional needs, but expressed appreciation for visit.   Body mass index is 31.21 kg/(m^2). Patient meets criteria for obesity, class I based  on current BMI.   Current diet order is clear liquid, patient is consuming approximately n/a% of meals at this time. Labs and medications reviewed.   No nutrition interventions warranted at this time. If nutrition issues arise, please consult RD.   Jenifer A. Mayford Knife, RD, LDN, CDE Pager: [phone] After hours Pager: [phone]

## 2014-10-01 NOTE — Progress Notes (Signed)
Inpatient Diabetes Program Recommendations  AACE/ADA: New Consensus Statement on Inpatient Glycemic Control (2015)  Target Ranges:  Prepandial:   less than 140 mg/dL      Peak postprandial:   less than 180 mg/dL (1-2 hours)      Critically ill patients:  140 - 180 mg/dL   Review of Glycemic Control  Diabetes history: DM2 Outpatient Diabetes medications: Lantus 30 units QAM, Novolog 15 units 4-5x/day for meals and snacks. Current orders for Inpatient glycemic control: Lantus 15 units bid, Humalog sensitive tidwc  44 y.o. male with multiple medical problems who was discharged from the hospital yesterday after a 6 day stay for nausea / vomiting / acute on chronic renal failure. Sees Dr. Wardell Heath for diabetes management. Pt states he takes Lantus 30 units QAM and Humalog 15 units 4-5x/day for meals and snacks. States blood sugars usually run < 200 mg/dL. Rarely has low blood sugars. Weight stable. Pt states he has attended diabetes classes in the past. Results for ANDRZEJ, SCULLY (MRN [id number]) as of 10/01/2014 12:37  Ref. Range 09/30/2014 10:56 09/30/2014 17:06 09/30/2014 19:24 10/01/2014 08:17 10/01/2014 11:50  Glucose-Capillary Latest Ref Range: 65-99 mg/dL 306 (H) 323 (H) 247 (H) 246 (H) 257 (H)  Results for ETHIN, DRUMMOND (MRN [id number]) as of 10/01/2014 12:37  Ref. Range 09/21/2014 10:35  Hemoglobin A1C Latest Ref Range: 4.8-5.6 % 10.3 (H)  Results for REINER, LOEWEN (MRN [id number]) as of 10/01/2014 12:37  Ref. Range 10/01/2014 04:55  Sodium Latest Ref Range: 135-145 mmol/L 137  Potassium Latest Ref Range: 3.5-5.1 mmol/L 3.5  Chloride Latest Ref Range: 101-111 mmol/L 97 (L)  CO2 Latest Ref Range: 22-32 mmol/L 28  BUN Latest Ref Range: 6-20 mg/dL 46 (H)  Creatinine Latest Ref Range: 0.61-1.24 mg/dL 3.20 (H)  Calcium Latest Ref Range: 8.9-10.3 mg/dL 8.7 (L)  EGFR (Non-African Amer.) Latest Ref Range: >60 mL/min 22 (L)  EGFR (African American) Latest Ref Range: >60 mL/min 26 (L)   Glucose Latest Ref Range: 65-99 mg/dL 212 (H)  Anion gap Latest Ref Range: 5-15  12   Needs insulin adjustment.  Inpatient Diabetes Program Recommendations:    Increase Lantus to 16 units bid. Titrate until FBS < 180 mg/dL. Add meal coverage - Novolog 6 units tidwc for meal coverage insulin if pt eats > 50% meal Add HS correction.  Will continue to follow. Thank you. Lorenda Peck, RD, LDN, CDE Inpatient Diabetes Coordinator [phone]

## 2014-10-01 NOTE — Progress Notes (Signed)
[facility] PROGRESS NOTE  Russell [name]:[id number] DOB: September 12, 1970 DOA: 09/30/2014 PCP: [name], MD  Assessment/Plan: Nausea / vomiting. Pt was recently discharged following one week stay for nausea, vomiting and acute on chronic renal failure.    On scheduled IV Reglan.   Continued on IV ativan BID  U/A with mod blood, 0-2RBC's, no leuks and no nitrites  Pt still reports continued nausea, unable to tolerate clears. Advise increased ambulation  Diabetes. Uncontrolled. Recent hgb a1c 10.3  Continue home BID Lantus, increased to 16 units per Diabetic Coordinator recs  Pt still not tolerating PO, per above  Cont SSI Q 4 hours  Hypertension.   Continue home Toprol.   Hold home diuretics.   Add prn IV hydralazine.  Nephrotic Syndrome / CKD. Stage 3. Renal function actually improved since recent hospital discharge.   Hold nephrotoxic medications.   Cont IVF as tolerated.  Prolonged QT interval on EGC.   Serum potassium normal.   Mg level at 1.7. Will give 2gm magnesium to keep over 2.0  Holding medications which can cause QT prolongation.   Continue telemetry.  Hx of GERD. Severe esophagitis on EGD in March 2016. Not on anti-reflux regimen at home. Start BID IV PPI.   Code Status: Full Family Communication: Pt in room, wife at bedside (indicate person spoken with, relationship, and if by phone, the number) Disposition Plan: pending   Consultants:    Procedures:    Antibiotics: Anti-infectives    Start     Dose/Rate Route Frequency Ordered Stop   09/30/14 1545  cefTRIAXone (ROCEPHIN) 1 g in dextrose 5 % 50 mL IVPB     1 g 100 mL/hr over 30 Minutes Intravenous  Once 09/30/14 1540 09/30/14 1632      HPI/Subjective: Still feels nauseated and is unable to tolerate clears this AM  Objective: Filed Vitals:   10/01/14 0501 10/01/14 0600 10/01/14 0603 10/01/14 0800  BP: 189/111 191/107 174/81 159/86  Pulse: 118 115  116 113  Temp: 98.6 F (37 C)   98.3 F (36.8 C)  TempSrc: Oral   Oral  Resp: 18   18  Height:      Weight:      SpO2: 93%   94%    Intake/Output Summary (Last 24 hours) at 10/01/14 1500 Last data filed at 10/01/14 1300  Gross per 24 hour  Intake 2577.5 ml  Output    500 ml  Net 2077.5 ml   Filed Weights   09/30/14 1100 09/30/14 1914  Weight: 127.914 kg (282 lb) 110.3 kg (243 lb 2.7 oz)    Exam:   General:  Awake, in nad  Cardiovascular: regular, s1, s2  Respiratory: normal resp effort, no wheezing  Abdomen: soft,nondistended, obese  Musculoskeletal: perfused, no clubbing   Data Reviewed: Basic Metabolic Panel:  Recent Labs Lab 09/27/14 0512 09/28/14 0352 09/29/14 0450 09/30/14 1100 09/30/14 1617 10/01/14 0455  NA 137 137 136 137  --  137  K 4.1 4.2 3.8 4.1  --  3.5  CL 101 101 97* 97*  --  97*  CO2 --  28  GLUCOSE 177* 186* 161* 313*  --  212*  BUN 69* 62* 54* 47*  --  46*  CREATININE 4.43* 3.85* 3.34* 3.04*  --  3.20*  CALCIUM 9.2 8.7* 8.7* 9.2  --  8.7*  MG  --   --   --   --  1.7  --   PHOS  --   --  5.6*  --   --   --    Liver Function Tests:  Recent Labs Lab 09/29/14 0450 09/30/14 1100 10/01/14 0455  AST  --  35 34  ALT  --  36 33  ALKPHOS  --  72 66  BILITOT  --  1.1 0.8  PROT  --  6.1* 5.6*  ALBUMIN 2.2* 2.6* 2.4*    Recent Labs Lab 09/30/14 1100  LIPASE 16*   No results for input(s): AMMONIA in the last 168 hours. CBC:  Recent Labs Lab 09/25/14 0510 09/26/14 0500 09/27/14 0512 09/30/14 1100  WBC 7.7 6.6 6.7 7.6  HGB 11.5* 10.8* 12.3* 14.2  HCT 33.1* 31.5* 37.1* 41.9  MCV 85.8 86.1 88.1 84.8  PLT 243 255 309 334   Cardiac Enzymes:  Recent Labs Lab 09/25/14 0510 09/26/14 0500 09/28/14 0352  CKTOTAL 1949* 1606* 573*   BNP (last 3 results) No results for input(s): BNP in the last 8760 hours.  ProBNP (last 3 results) No results for input(s): PROBNP in the last 8760 hours.  CBG:  Recent  Labs Lab 09/30/14 1056 09/30/14 1706 09/30/14 1924 10/01/14 0817 10/01/14 1150  GLUCAP 306* 323* 247* 246* 257*    Recent Results (from the past 240 hour(s))  MRSA PCR Screening     Status: None   Collection Time: 09/21/14  4:44 PM  Result Value Ref Range Status   MRSA by PCR NEGATIVE NEGATIVE Final    Comment:        The GeneXpert MRSA Assay (FDA approved for NASAL specimens only), is one component of a comprehensive MRSA colonization surveillance program. It is not intended to diagnose MRSA infection nor to guide or monitor treatment for MRSA infections.      Studies: Ct Abdomen Pelvis Wo Contrast  09/30/2014   CLINICAL DATA:  Intractable nausea and vomiting. Diaphoresis. Chronic kidney disease stage 3.  EXAM: CT ABDOMEN AND PELVIS WITHOUT CONTRAST  TECHNIQUE: Multidetector CT imaging of the abdomen and pelvis was performed following the standard protocol without IV contrast.  COMPARISON:  09/21/2014  FINDINGS: Lower chest:  No acute findings.  Hepatobiliary: No mass visualized on this un-enhanced exam. Gallbladder is unremarkable.  Pancreas: No mass or inflammatory process identified on this un-enhanced exam.  Spleen: Within normal limits in size.  Adrenals/Urinary Tract: No evidence of adrenal mass. Horseshoe kidney again demonstrated. No evidence of renal calculi or hydronephrosis. No evidence of ureteral calculi or dilatation. Small amount of gas noted in urinary bladder, which may be due to recent instrumentation.  Stomach/Bowel: No evidence of obstruction, inflammatory process, or abnormal fluid collections.  Vascular/Lymphatic: No pathologically enlarged lymph nodes. No evidence of abdominal aortic aneurysm.  Reproductive: No mass or other significant abnormality.  Other: None.  Musculoskeletal:  No suspicious bone lesions identified.  IMPRESSION: Horseshoe kidney again noted. No evidence of urolithiasis, hydronephrosis, or other acute findings.  Small amount of gas noted in  urinary bladder, likely due to recent instrumentation ; clinical correlation is recommended.   Electronically Signed   By: Myles Rosenthal M.D.   On: 09/30/2014 18:20    Scheduled Meds: . enoxaparin (LOVENOX) injection  40 mg Subcutaneous Q24H  . insulin aspart  0-9 Units Subcutaneous TID WC  . insulin glargine  16 Units Subcutaneous BID  . magnesium sulfate 1 - 4 g bolus IVPB  2 g Intravenous Once  . metoCLOPramide (REGLAN) injection  5 mg Intravenous q12n4p  . pantoprazole (PROTONIX) IV  40 mg Intravenous Q12H   Continuous  Infusions: . sodium chloride 125 mL/hr at 10/01/14 1130    Principal Problem:   Nausea with vomiting Active Problems:   Intractable nausea and vomiting   Hypertension   Diabetes   Hx of gastroesophageal reflux (GERD)   Prolonged Q-T interval on ECG   CKD (chronic kidney disease), stage III    CHIU, STEPHEN K  [facility] Pager [phone]. If 7PM-7AM, please contact night-coverage at [email or web address], password [facility] 10/01/2014, 3:00 PM

## 2014-10-01 NOTE — Progress Notes (Signed)
NP made aware of elevated BPs.

## 2014-10-02 ENCOUNTER — Telehealth: Payer: Self-pay | Admitting: Family Medicine

## 2014-10-02 DIAGNOSIS — I10 Essential (primary) hypertension: Secondary | ICD-10-CM

## 2014-10-02 DIAGNOSIS — Z79899 Other long term (current) drug therapy: Secondary | ICD-10-CM | POA: Diagnosis not present

## 2014-10-02 DIAGNOSIS — N179 Acute kidney failure, unspecified: Secondary | ICD-10-CM | POA: Diagnosis present

## 2014-10-02 DIAGNOSIS — E785 Hyperlipidemia, unspecified: Secondary | ICD-10-CM | POA: Diagnosis present

## 2014-10-02 DIAGNOSIS — Z794 Long term (current) use of insulin: Secondary | ICD-10-CM | POA: Diagnosis not present

## 2014-10-02 DIAGNOSIS — E669 Obesity, unspecified: Secondary | ICD-10-CM | POA: Diagnosis present

## 2014-10-02 DIAGNOSIS — E1122 Type 2 diabetes mellitus with diabetic chronic kidney disease: Secondary | ICD-10-CM | POA: Diagnosis present

## 2014-10-02 DIAGNOSIS — Z6832 Body mass index (BMI) 32.0-32.9, adult: Secondary | ICD-10-CM | POA: Diagnosis not present

## 2014-10-02 DIAGNOSIS — E079 Disorder of thyroid, unspecified: Secondary | ICD-10-CM | POA: Diagnosis present

## 2014-10-02 DIAGNOSIS — R112 Nausea with vomiting, unspecified: Secondary | ICD-10-CM | POA: Diagnosis not present

## 2014-10-02 DIAGNOSIS — K219 Gastro-esophageal reflux disease without esophagitis: Secondary | ICD-10-CM | POA: Diagnosis present

## 2014-10-02 DIAGNOSIS — N183 Chronic kidney disease, stage 3 (moderate): Secondary | ICD-10-CM | POA: Diagnosis present

## 2014-10-02 DIAGNOSIS — I129 Hypertensive chronic kidney disease with stage 1 through stage 4 chronic kidney disease, or unspecified chronic kidney disease: Secondary | ICD-10-CM | POA: Diagnosis present

## 2014-10-02 DIAGNOSIS — E876 Hypokalemia: Secondary | ICD-10-CM | POA: Diagnosis present

## 2014-10-02 DIAGNOSIS — I4581 Long QT syndrome: Secondary | ICD-10-CM | POA: Diagnosis not present

## 2014-10-02 DIAGNOSIS — Z8719 Personal history of other diseases of the digestive system: Secondary | ICD-10-CM | POA: Diagnosis not present

## 2014-10-02 DIAGNOSIS — R1110 Vomiting, unspecified: Secondary | ICD-10-CM | POA: Diagnosis present

## 2014-10-02 DIAGNOSIS — E1165 Type 2 diabetes mellitus with hyperglycemia: Secondary | ICD-10-CM | POA: Diagnosis present

## 2014-10-02 DIAGNOSIS — Z86718 Personal history of other venous thrombosis and embolism: Secondary | ICD-10-CM | POA: Diagnosis not present

## 2014-10-02 DIAGNOSIS — E1140 Type 2 diabetes mellitus with diabetic neuropathy, unspecified: Secondary | ICD-10-CM | POA: Diagnosis present

## 2014-10-02 DIAGNOSIS — E11319 Type 2 diabetes mellitus with unspecified diabetic retinopathy without macular edema: Secondary | ICD-10-CM | POA: Diagnosis present

## 2014-10-02 DIAGNOSIS — N049 Nephrotic syndrome with unspecified morphologic changes: Secondary | ICD-10-CM | POA: Diagnosis present

## 2014-10-02 LAB — BASIC METABOLIC PANEL
Anion gap: 8 (ref 5–15)
BUN: 37 mg/dL — ABNORMAL HIGH (ref 6–20)
CHLORIDE: 101 mmol/L (ref 101–111)
CO2: 29 mmol/L (ref 22–32)
Calcium: 8.4 mg/dL — ABNORMAL LOW (ref 8.9–10.3)
Creatinine, Ser: 2.84 mg/dL — ABNORMAL HIGH (ref 0.61–1.24)
GFR calc non Af Amer: 26 mL/min — ABNORMAL LOW (ref >60)
GFR, EST AFRICAN AMERICAN: 30 mL/min — AB (ref >60)
Glucose, Bld: 114 mg/dL — ABNORMAL HIGH (ref 65–99)
POTASSIUM: 3.2 mmol/L — AB (ref 3.5–5.1)
SODIUM: 138 mmol/L (ref 135–145)

## 2014-10-02 LAB — CBC
HEMATOCRIT: 36.3 % — AB (ref 39.0–52.0)
HEMOGLOBIN: 12.3 g/dL — AB (ref 13.0–17.0)
MCH: 29 pg (ref 26.0–34.0)
MCHC: 33.9 g/dL (ref 30.0–36.0)
MCV: 85.6 fL (ref 78.0–100.0)
Platelets: 309 10*3/uL (ref 150–400)
RBC: 4.24 MIL/uL (ref 4.22–5.81)
RDW: 12.8 % (ref 11.5–15.5)
WBC: 8.4 10*3/uL (ref 4.0–10.5)

## 2014-10-02 LAB — GLUCOSE, CAPILLARY
GLUCOSE-CAPILLARY: 168 mg/dL — AB (ref 65–99)
GLUCOSE-CAPILLARY: 142 mg/dL — AB (ref 65–99)
GLUCOSE-CAPILLARY: 167 mg/dL — AB (ref 65–99)
Glucose-Capillary: 119 mg/dL — ABNORMAL HIGH (ref 65–99)

## 2014-10-02 LAB — MAGNESIUM: Magnesium: 2.3 mg/dL (ref 1.7–2.4)

## 2014-10-02 MED ORDER — ALPRAZOLAM 0.5 MG PO TABS: 0.5000 mg | ORAL_TABLET | Freq: Every evening | ORAL | Status: DC | PRN

## 2014-10-02 MED ORDER — POTASSIUM CHLORIDE 20 MEQ/15ML (10%) PO SOLN
60.0000 meq | Freq: Once | ORAL | Status: AC
  Administered 2014-10-02: 60 meq via ORAL
  Filled 2014-10-02: qty 45

## 2014-10-02 MED ORDER — LEVOTHYROXINE SODIUM 50 MCG PO TABS
50.0000 ug | ORAL_TABLET | Freq: Every day | ORAL | Status: DC
  Administered 2014-10-02 – 2014-10-03 (×2): 50 ug via ORAL
  Filled 2014-10-02 (×2): qty 1

## 2014-10-02 MED ORDER — METOPROLOL SUCCINATE ER 100 MG PO TB24
100.0000 mg | ORAL_TABLET | Freq: Every day | ORAL | Status: DC
  Administered 2014-10-02 – 2014-10-03 (×2): 100 mg via ORAL
  Filled 2014-10-02 (×2): qty 1

## 2014-10-02 NOTE — Clinical Documentation Improvement (Signed)
  Internal Medicine  Can the diagnosis of acute renal failure be further specified?   Acute Renal Failure/Acute Kidney Injury  Acute Tubular Necrosis  Acute Renal Cortical Necrosis  Acute Renal Medullary Necrosis  Acute on Chronic Renal Failure  Chronic Renal Failure  Other  Clinically Undetermined  Document any associated diagnoses/conditions.   Supporting Information: BUN - 47, 46, 37; Creatinine - 3.04, 3.20, 2.87; GFR 24, 22, 26.  Just d/c from acute renal failure   Please exercise your independent, professional judgment when responding. A specific answer is not anticipated or expected.   Thank You,  Harrie Jeans, RN, BSN, MBA, [facility] Health Information Management [place] [phone]

## 2014-10-02 NOTE — Progress Notes (Signed)
[facility] PROGRESS NOTE  Russell [name] ZOX:[id number] DOB: July 31, 1970 DOA: 09/30/2014 PCP: [name], MD  Assessment/Plan: Nausea / vomiting. Pt was recently discharged following one week stay for nausea, vomiting and acute on chronic renal failure.    Continued on scheduled IV Reglan.   Continued on IV ativan BID  U/A with mod blood, 0-2RBC's, no leuks and no nitrites  Reports feeling better today and states "I'm ready to eat." Advance diet as tolerated  Diabetes. Uncontrolled. Recent hgb a1c 10.3  Pt claims glucose is in the 120 range prior to admit  Continue home BID Lantus, increased to 16 units per Diabetic Coordinator recs  Pt still not tolerating PO, per above  Cont SSI Q 4 hours  Hypertension.   Continue home Toprol.   Hold home diuretics.   Add prn IV hydralazine.  Nephrotic Syndrome / CKD. Stage 3. Renal function actually improved since recent hospital discharge.   Holding nephrotoxic medications.   Cont IVF as tolerated.  Cr is improving  Prolonged QT interval on EGC.   K low at 3.2 this AM. Replaced.  Mg normal at 2.3  Trying to avoid medications which can cause QT prolongation.   QTc on admit of 511. After correcting lytes, repeat EKG on 9/30 with QTc of 471. Prior EKG with QTc of 470, thus current QTc appears to be at baseline.  Hx of GERD. Severe esophagitis on EGD in March 2016. Not on anti-reflux regimen at home. Start BID IV PPI.   Hypokalemia  Hypomagnesemia  Code Status: Full Family Communication: Pt in room, family at bedside Disposition Plan: Home when able to tolerate PO reliably   Consultants:    Procedures:    Antibiotics: Anti-infectives    Start     Dose/Rate Route Frequency Ordered Stop   09/30/14 1545  cefTRIAXone (ROCEPHIN) 1 g in dextrose 5 % 50 mL IVPB     1 g 100 mL/hr over 30 Minutes Intravenous  Once 09/30/14 1540 09/30/14 1632      HPI/Subjective: States "I'm ready to  eat." Overall feels somewhat better  Objective: Filed Vitals:   10/02/14 1001 10/02/14 1004 10/02/14 1005 10/02/14 1102  BP: 155/110 180/97 184/104 161/93  Pulse: 100     Temp: 98.3 F (36.8 C)     TempSrc: Oral     Resp: 18     Height:      Weight:      SpO2: 96%       Intake/Output Summary (Last 24 hours) at 10/02/14 1552 Last data filed at 10/02/14 1002  Gross per 24 hour  Intake 2989.58 ml  Output   2050 ml  Net 939.58 ml   Filed Weights   09/30/14 1100 09/30/14 1914 10/01/14 2048  Weight: 127.914 kg (282 lb) 110.3 kg (243 lb 2.7 oz) 115.259 kg (254 lb 1.6 oz)    Exam:   General:  Awake, laying in bed, in nad  Cardiovascular: regular, s1, s2  Respiratory: normal resp effort, no wheezing  Abdomen: soft,nondistended, obese  Musculoskeletal: perfused, no clubbing, no cyanosis  Data Reviewed: Basic Metabolic Panel:  Recent Labs Lab 09/28/14 0352 09/29/14 0450 09/30/14 1100 09/30/14 1617 10/01/14 0455 10/02/14 0420  NA 137 136 137  --  137 138  K 4.2 3.8 4.1  --  3.5 3.2*  CL 101 97* 97*  --  97* 101  CO2 --  28 29  GLUCOSE 186* 161* 313*  --  212* 114*  BUN 62*  54* 47*  --  46* 37*  CREATININE 3.85* 3.34* 3.04*  --  3.20* 2.84*  CALCIUM 8.7* 8.7* 9.2  --  8.7* 8.4*  MG  --   --   --  1.7  --  2.3  PHOS  --  5.6*  --   --   --   --    Liver Function Tests:  Recent Labs Lab 09/29/14 0450 09/30/14 1100 10/01/14 0455  AST  --  35 34  ALT  --  36 33  ALKPHOS  --  72 66  BILITOT  --  1.1 0.8  PROT  --  6.1* 5.6*  ALBUMIN 2.2* 2.6* 2.4*    Recent Labs Lab 09/30/14 1100  LIPASE 16*   No results for input(s): AMMONIA in the last 168 hours. CBC:  Recent Labs Lab 09/26/14 0500 09/27/14 0512 09/30/14 1100 10/02/14 0420  WBC 6.6 6.7 7.6 8.4  HGB 10.8* 12.3* 14.2 12.3*  HCT 31.5* 37.1* 41.9 36.3*  MCV 86.1 88.1 84.8 85.6  PLT 255 309 334 309   Cardiac Enzymes:  Recent Labs Lab 09/26/14 0500 09/28/14 0352  CKTOTAL  1606* 573*   BNP (last 3 results) No results for input(s): BNP in the last 8760 hours.  ProBNP (last 3 results) No results for input(s): PROBNP in the last 8760 hours.  CBG:  Recent Labs Lab 10/01/14 1150 10/01/14 1644 10/01/14 2047 10/02/14 0757 10/02/14 1206  GLUCAP 257* 210* 215* 119* 168*    No results found for this or any previous visit (from the past 240 hour(s)).   Studies: Ct Abdomen Pelvis Wo Contrast  09/30/2014   CLINICAL DATA:  Intractable nausea and vomiting. Diaphoresis. Chronic kidney disease stage 3.  EXAM: CT ABDOMEN AND PELVIS WITHOUT CONTRAST  TECHNIQUE: Multidetector CT imaging of the abdomen and pelvis was performed following the standard protocol without IV contrast.  COMPARISON:  09/21/2014  FINDINGS: Lower chest:  No acute findings.  Hepatobiliary: No mass visualized on this un-enhanced exam. Gallbladder is unremarkable.  Pancreas: No mass or inflammatory process identified on this un-enhanced exam.  Spleen: Within normal limits in size.  Adrenals/Urinary Tract: No evidence of adrenal mass. Horseshoe kidney again demonstrated. No evidence of renal calculi or hydronephrosis. No evidence of ureteral calculi or dilatation. Small amount of gas noted in urinary bladder, which may be due to recent instrumentation.  Stomach/Bowel: No evidence of obstruction, inflammatory process, or abnormal fluid collections.  Vascular/Lymphatic: No pathologically enlarged lymph nodes. No evidence of abdominal aortic aneurysm.  Reproductive: No mass or other significant abnormality.  Other: None.  Musculoskeletal:  No suspicious bone lesions identified.  IMPRESSION: Horseshoe kidney again noted. No evidence of urolithiasis, hydronephrosis, or other acute findings.  Small amount of gas noted in urinary bladder, likely due to recent instrumentation ; clinical correlation is recommended.   Electronically Signed   By: Myles Rosenthal M.D.   On: 09/30/2014 18:20    Scheduled Meds: . enoxaparin  (LOVENOX) injection  40 mg Subcutaneous Q24H  . insulin aspart  0-9 Units Subcutaneous TID WC  . insulin glargine  16 Units Subcutaneous BID  . levothyroxine  50 mcg Oral Daily  . metoCLOPramide (REGLAN) injection  5 mg Intravenous q12n4p  . metoprolol succinate  100 mg Oral Daily  . pantoprazole (PROTONIX) IV  40 mg Intravenous Q12H   Continuous Infusions: . sodium chloride 100 mL/hr at 10/02/14 0345    Principal Problem:   Nausea with vomiting Active Problems:   Intractable nausea and  vomiting   Hypertension   Diabetes   Hx of gastroesophageal reflux (GERD)   Prolonged Q-T interval on ECG   CKD (chronic kidney disease), stage III    CHIU, STEPHEN K  [facility] Pager [phone]. If 7PM-7AM, please contact night-coverage at [email or web address], password [facility] 10/02/2014, 3:52 PM  LOS: 0 days

## 2014-10-03 DIAGNOSIS — G43A1 Cyclical vomiting, intractable: Secondary | ICD-10-CM

## 2014-10-03 LAB — BASIC METABOLIC PANEL
ANION GAP: 7 (ref 5–15)
BUN: 30 mg/dL — ABNORMAL HIGH (ref 6–20)
CALCIUM: 7.8 mg/dL — AB (ref 8.9–10.3)
CHLORIDE: 103 mmol/L (ref 101–111)
CO2: 26 mmol/L (ref 22–32)
Creatinine, Ser: 2.64 mg/dL — ABNORMAL HIGH (ref 0.61–1.24)
GFR calc non Af Amer: 28 mL/min — ABNORMAL LOW (ref >60)
GFR, EST AFRICAN AMERICAN: 32 mL/min — AB (ref >60)
GLUCOSE: 151 mg/dL — AB (ref 65–99)
POTASSIUM: 3.5 mmol/L (ref 3.5–5.1)
Sodium: 136 mmol/L (ref 135–145)

## 2014-10-03 LAB — GLUCOSE, CAPILLARY
GLUCOSE-CAPILLARY: 138 mg/dL — AB (ref 65–99)
Glucose-Capillary: 156 mg/dL — ABNORMAL HIGH (ref 65–99)

## 2014-10-03 MED ORDER — PROMETHAZINE HCL 12.5 MG PO TABS: 12.5000 mg | ORAL_TABLET | Freq: Four times a day (QID) | ORAL | Status: DC | PRN

## 2014-10-03 MED ORDER — METOCLOPRAMIDE HCL 5 MG PO TABS: 5.0000 mg | ORAL_TABLET | Freq: Four times a day (QID) | ORAL | Status: DC

## 2014-10-03 MED ORDER — AMLODIPINE BESYLATE 5 MG PO TABS
5.0000 mg | ORAL_TABLET | Freq: Every day | ORAL | Status: DC
  Administered 2014-10-03: 5 mg via ORAL
  Filled 2014-10-03: qty 1

## 2014-10-03 MED ORDER — AMLODIPINE BESYLATE 5 MG PO TABS: 5.0000 mg | ORAL_TABLET | Freq: Every day | ORAL | Status: DC

## 2014-10-03 NOTE — Discharge Summary (Signed)
Physician Discharge Summary  Russell [name] ZOX:[id number] DOB: [birth year] DOA: 09/30/2014  PCP: [name], MD  Admit date: 09/30/2014 Discharge date: 10/03/2014  Time spent: 20 minutes  Recommendations for Outpatient Follow-up:  1. Follow up with PCP within 1 week 2. Follow up with Nephrology (Dr. Lacy Duverney) as already scheduled  Discharge Diagnoses:  Principal Problem:   Nausea with vomiting Active Problems:   Intractable nausea and vomiting   Hypertension   Diabetes   Hx of gastroesophageal reflux (GERD)   Prolonged Q-T interval on ECG   CKD (chronic kidney disease), stage III   Discharge Condition: Improved  Diet recommendation: Diabetic  Filed Weights   09/30/14 1914 10/01/14 2048 10/02/14 2036  Weight: 110.3 kg (243 lb 2.7 oz) 115.259 kg (254 lb 1.6 oz) 116.3 kg (256 lb 6.3 oz)    History of present illness:  Please see admit h and p from 9/28 for details. Briefly, pt presented with intractable n/v after recent discharge for the same. Patient was admitted for further work up.  Hospital Course:   Nausea / vomiting. Pt was recently discharged following one week stay for nausea, vomiting and acute on chronic renal failure.    Patient was continued on scheduled IV Reglan.   Continued on IV ativan BID  U/A with mod blood, 0-2RBC's, no leuks and no nitrites  Patient successfully advanced diet to regular PO  Pt will be discharged with low dose reglan and PRN PO phenergan  Diabetes. Uncontrolled. Recent hgb a1c 10.3  Pt claims glucose is in the 120 range prior to admit  Continue home BID Lantus, increased to 16 units per Diabetic Coordinator recs  Pt still not tolerating PO, per above  Cont SSI Q 4 hours  Hypertension.   Continue home Toprol.   Hold home diuretics.   Add prn IV hydralazine.  Nephrotic Syndrome / CKD. Stage 3. Renal function actually improved since recent hospital discharge.   Holding nephrotoxic medications.   Cont  IVF as tolerated.  Cr is improving  Prolonged QT interval on EGC.   K low at 3.2 this AM. Replaced.  Mg normal at 2.3  Trying to avoid medications which can cause QT prolongation.   QTc on admit of 511. After correcting lytes, repeat EKG on 9/30 with QTc of 471. Prior EKG with QTc of 470, thus current QTc appears to be at baseline.  Hx of GERD. Severe esophagitis on EGD in March 2016. Not on anti-reflux regimen at home. Start BID IV PPI.   Hypokalemia - replaced  Hypomagnesemia - replaced  Possible Hypercoagulable state - Recent d/c summary reviewed. Pt to follow up as outpatient regarding anticoagulation. After extensive chart review, pt was recently discharged on last admit with recs to hold anticoagulation until followed up as outpatient.   Acute Renal Failure - Renal function improved with hydration - Advised patient to continue PO fluids as tolerated - Patient reports very good urine output, thus will continue to hold lasix on discharge. Patient reports close follow up already scheduled with Nephrology.   Discharge Exam: Filed Vitals:   10/02/14 2112 10/03/14 0037 10/03/14 0442 10/03/14 0930  BP: 148/84 142/68 167/88 165/87  Pulse: 85 74 86 78  Temp:   97.9 F (36.6 C) 97.8 F (36.6 C)  TempSrc:   Oral Oral  Resp:   18 19  Height:      Weight:      SpO2:   98% 98%    General: Awake, in nad Cardiovascular: regular, s1, s2  Respiratory: normal resp effort, no wheezing  Discharge Instructions     Medication List    STOP taking these medications        furosemide 80 MG tablet  Commonly known as:  LASIX      TAKE these medications        acetaminophen 500 MG tablet  Commonly known as:  TYLENOL  Take 1 tablet (500 mg total) by mouth every 6 (six) hours as needed for moderate pain.     albuterol 108 (90 BASE) MCG/ACT inhaler  Commonly known as:  PROVENTIL HFA;VENTOLIN HFA  Inhale 1 puff into the lungs every 6 (six) hours as needed for wheezing or  shortness of breath.     albuterol (2.5 MG/3ML) 0.083% nebulizer solution  Commonly known as:  PROVENTIL  Take 3 mLs (2.5 mg total) by nebulization every 6 (six) hours as needed for wheezing or shortness of breath.     albuterol-ipratropium 18-103 MCG/ACT inhaler  Commonly known as:  COMBIVENT  Inhale 2 puffs into the lungs every 4 (four) hours.     ALPRAZolam 0.5 MG tablet  Commonly known as:  XANAX  TAKE 1 TABLET BY MOUTH TWICE A DAY AS NEEDED FOR SLEEP     amLODipine 5 MG tablet  Commonly known as:  NORVASC  Take 1 tablet (5 mg total) by mouth daily.     citalopram 20 MG tablet  Commonly known as:  CELEXA  Take 1 tablet (20 mg total) by mouth daily. TAKE 1 TABLET (20 MG TOTAL) BY MOUTH DAILY    "OV NEEDED FOR REFILLS"     cyclobenzaprine 10 MG tablet  Commonly known as:  FLEXERIL  Take 1 tablet (10 mg total) by mouth at bedtime.     gabapentin 100 MG capsule  Commonly known as:  NEURONTIN  Take 1 capsule (100 mg total) by mouth at bedtime.     HYDROcodone-acetaminophen 5-325 MG tablet  Commonly known as:  NORCO/VICODIN  Take 1 tablet by mouth every 6 (six) hours as needed for moderate pain.     insulin glargine 100 UNIT/ML injection  Commonly known as:  LANTUS  Inject 0.15 mLs (15 Units total) into the skin 2 (two) times daily.     Insulin Lispro (Human) 200 UNIT/ML Sopn  Commonly known as:  HUMALOG KWIKPEN  Inject 33 Units into the skin 3 (three) times daily before meals.     levothyroxine 50 MCG tablet  Commonly known as:  SYNTHROID, LEVOTHROID  Take 1 tablet (50 mcg total) by mouth daily.     metoCLOPramide 5 MG tablet  Commonly known as:  REGLAN  Take 1 tablet (5 mg total) by mouth 4 (four) times daily.     metoprolol succinate 100 MG 24 hr tablet  Commonly known as:  TOPROL XL  Take 1 tablet (100 mg total) by mouth daily.     promethazine 25 MG tablet  Commonly known as:  PHENERGAN  Take 1 tablet (25 mg total) by mouth every 6 (six) hours as needed for  nausea.     promethazine 12.5 MG tablet  Commonly known as:  PHENERGAN  Take 1 tablet (12.5 mg total) by mouth every 6 (six) hours as needed for nausea or vomiting.     traMADol 50 MG tablet  Commonly known as:  ULTRAM  Take 1 tablet (50 mg total) by mouth every 8 (eight) hours as needed.       Allergies  Allergen Reactions  . Ibuprofen Other (See  Comments)    MD told him not to take due to kidney disease.   Follow-up Information    Follow up with [name], MD. Schedule an appointment as soon as possible for a visit in 1 week.   Specialty:  Family Medicine   Why:  Hospital follow up   Contact information:   [address] [place] [address] [phone]       Follow up with Cecille Aver, MD.   Specialty:  Nephrology   Why:  Hospital follow up as scheduled already   Contact information:   [address] [place] [address] [phone]        The results of significant diagnostics from this hospitalization (including imaging, microbiology, ancillary and laboratory) are listed below for reference.    Significant Diagnostic Studies: Ct Abdomen Pelvis Wo Contrast  09/30/2014   CLINICAL DATA:  Intractable nausea and vomiting. Diaphoresis. Chronic kidney disease stage 3.  EXAM: CT ABDOMEN AND PELVIS WITHOUT CONTRAST  TECHNIQUE: Multidetector CT imaging of the abdomen and pelvis was performed following the standard protocol without IV contrast.  COMPARISON:  09/21/2014  FINDINGS: Lower chest:  No acute findings.  Hepatobiliary: No mass visualized on this un-enhanced exam. Gallbladder is unremarkable.  Pancreas: No mass or inflammatory process identified on this un-enhanced exam.  Spleen: Within normal limits in size.  Adrenals/Urinary Tract: No evidence of adrenal mass. Horseshoe kidney again demonstrated. No evidence of renal calculi or hydronephrosis. No evidence of ureteral calculi or dilatation. Small amount of gas noted in urinary bladder, which may be due to  recent instrumentation.  Stomach/Bowel: No evidence of obstruction, inflammatory process, or abnormal fluid collections.  Vascular/Lymphatic: No pathologically enlarged lymph nodes. No evidence of abdominal aortic aneurysm.  Reproductive: No mass or other significant abnormality.  Other: None.  Musculoskeletal:  No suspicious bone lesions identified.  IMPRESSION: Horseshoe kidney again noted. No evidence of urolithiasis, hydronephrosis, or other acute findings.  Small amount of gas noted in urinary bladder, likely due to recent instrumentation ; clinical correlation is recommended.   Electronically Signed   By: Myles Rosenthal M.D.   On: 09/30/2014 18:20   Ct Abdomen Pelvis Wo Contrast  09/21/2014   CLINICAL DATA:  44 year old male with intractable nausea and vomiting for 3 days.  EXAM: CT ABDOMEN AND PELVIS WITHOUT CONTRAST  TECHNIQUE: Multidetector CT imaging of the abdomen and pelvis was performed following the standard protocol without IV contrast.  COMPARISON:  12/12/2013 CT  FINDINGS: Please note that parenchymal abnormalities may be missed without intravenous contrast.  Lower chest:  No acute abnormalities.  Hepatobiliary: The liver and gallbladder are unremarkable. There is no evidence of biliary dilatation.  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 There is mild- moderate perinephric inflammation and small amount of fluid within the air renal spaces extending in the retroperitoneum into the pelvis. There is no evidence of hydronephrosis or urinary calculi. The adrenal glands and bladder are unremarkable.  Stomach/Bowel: There is no evidence of bowel obstruction or definite bowel wall thickening. The appendix is normal.  Vascular/Lymphatic: No enlarged lymph nodes or abdominal aortic aneurysm.  Reproductive: Prostate within normal limits.  Other: A trace amount of free pelvic fluid is identified. There is no evidence of pneumoperitoneum.   Musculoskeletal: No acute or suspicious abnormalities identified.  IMPRESSION: Mild-moderate inflammation/stranding with small amount of retroperitoneal and free pelvic fluid. This is nonspecific but could be related to renal inflammation or infection. Horseshoe kidney without evidence of  hydronephrosis or urinary calculi.   Electronically Signed   By: Harmon Pier M.D.   On: 09/21/2014 16:31   Ct Head Wo Contrast  09/21/2014   CLINICAL DATA:  Complains of gradual onset headache 3 days ago accompanied by multiple episodes of vomiting. He's been unable to hold down any of his medications including his blood pressure medications. No other associated symptoms.  EXAM: CT HEAD WITHOUT CONTRAST  TECHNIQUE: Contiguous axial images were obtained from the base of the skull through the vertex without intravenous contrast.  COMPARISON:  None.  FINDINGS: Ventricles are normal in size and configuration. There are no parenchymal masses or mass effect. There is no evidence of an infarct. No extra-axial masses or abnormal fluid collections.  There is no intracranial hemorrhage.  Visualized sinuses and mastoid air cells are clear. No skull lesion.  IMPRESSION: Normal unenhanced CT scan of the brain.   Electronically Signed   By: Amie Portland M.D.   On: 09/21/2014 14:40   Mr Maxine Glenn Abdomen Wo Contrast  09/25/2014   CLINICAL DATA:  Diabetes, hypertension, obesity, acute renal failure. Evaluate for renal vascular disease. Nausea and vomiting.  EXAM: MRA ABDOMEN WITHOUT CONTRAST  TECHNIQUE: Angiographic images of the chest were obtained using MRA technique without intravenous contrast.  CONTRAST:  None.  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  Exam is limited because of motion artifact, body habitus, and lack of intravenous contrast.  Within the limits of the study, aorta is patent and normal in caliber. No  aneurysm or occlusive process. Celiac and SMA origins are patent.  An accessory left upper pole renal artery is small in caliber and patent.  Main renal arteries are visualized bilaterally and are patent without evidence of ostial or proximal stenosis.  The IVC and renal veins appear patent without evidence of occlusion or acute thrombus.  Diffuse body wall subcutaneous edema noted compatible with anasarca.  No gallstones or biliary dilatation. Negative for bowel obstruction.  IMPRESSION: Patent renal arteries including an accessory left upper pole renal artery. No evidence of significant renal vascular occlusive process.  Horseshoe kidney configuration  No evidence of acute renal vein or IVC thrombosis or occlusion.   Electronically Signed   By: Judie Petit.  Shick M.D.   On: 09/25/2014 14:01   US Renal  09/23/2014   CLINICAL DATA:  Acute renal failure.  EXAM: RENAL / URINARY TRACT ULTRASOUND COMPLETE  COMPARISON:  CT 09/21/2014.  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 No focal mass. No hydronephrosis. Echotexture normal.  Left Kidney:  Horseshoe kidney. Left portion measures 12.9 cm. No focal mass. No hydronephrosis. Echotexture normal .  Bladder:  Bladder is nondistended.  IMPRESSION: Horseshoe kidney. Renal normal. No hydronephrosis. No bladder distention. No acute abnormality.   Electronically Signed   By: Maisie Fus  Register   On: 09/23/2014 07:10   Dg Chest Port 1 View  09/24/2014   CLINICAL DATA:  Acute shortness of breath and chest pain for 2 days.  EXAM: PORTABLE CHEST 1 VIEW  COMPARISON:  09/22/2014 and prior chest radiographs  FINDINGS: The cardiomediastinal silhouette is unremarkable.  Minimal bibasilar atelectasis noted.  There is no evidence of focal airspace disease, pulmonary edema, suspicious pulmonary nodule/mass, pleural effusion, or pneumothorax.  No acute bony abnormalities are identified.  IMPRESSION: Minimal bibasilar atelectasis without other significant  abnormality.   Electronically Signed   By: Tinnie Gens  Hu M.D.   On: 09/24/2014 13:06   Dg Abd Acute W/chest  09/22/2014   CLINICAL DATA:  Abdominal tension, nausea, vomiting and RIGHT upper quadrant abdominal pain extending across abdomen, hypertension, diabetes mellitus, nephrotic syndrome  EXAM: DG ABDOMEN ACUTE W/ 1V CHEST  COMPARISON:  Chest radiograph 05/26/2014, CT abdomen pelvis 09/20/2014  FINDINGS: Borderline enlargement of cardiac silhouette.  Mediastinal contours and pulmonary vascularity normal.  Minimal LEFT basilar atelectasis.  Prominent RIGHT first costochondral junction unchanged.  No infiltrate, pleural effusion or pneumothorax.  Retained contrast in colon.  Nonobstructive bowel gas pattern.  No bowel dilatation or bowel wall thickening or free air.  Gas in rectum.  No urinary tract calcification or acute osseous findings.  IMPRESSION: LEFT basilar atelectasis.  Nonobstructive bowel gas pattern.   Electronically Signed   By: Ulyses Southward M.D.   On: 09/22/2014 22:30    Microbiology: No results found for this or any previous visit (from the past 240 hour(s)).   Labs: Basic Metabolic Panel:  Recent Labs Lab 09/29/14 0450 09/30/14 1100 09/30/14 1617 10/01/14 0455 10/02/14 0420 10/03/14 0537  NA 136 137  --  137 138 136  K 3.8 4.1  --  3.5 3.2* 3.5  CL 97* 97*  --  97* 101 103  CO2 30 27  --  28 29 26   GLUCOSE 161* 313*  --  212* 114* 151*  BUN 54* 47*  --  46* 37* 30*  CREATININE 3.34* 3.04*  --  3.20* 2.84* 2.64*  CALCIUM 8.7* 9.2  --  8.7* 8.4* 7.8*  MG  --   --  1.7  --  2.3  --   PHOS 5.6*  --   --   --   --   --    Liver Function Tests:  Recent Labs Lab 09/29/14 0450 09/30/14 1100 10/01/14 0455  AST  --  35 34  ALT  --  36 33  ALKPHOS  --  72 66  BILITOT  --  1.1 0.8  PROT  --  6.1* 5.6*  ALBUMIN 2.2* 2.6* 2.4*    Recent Labs Lab 09/30/14 1100  LIPASE 16*   No results for input(s): AMMONIA in the last 168 hours. CBC:  Recent Labs Lab  09/27/14 0512 09/30/14 1100 10/02/14 0420  WBC 6.7 7.6 8.4  HGB 12.3* 14.2 12.3*  HCT 37.1* 41.9 36.3*  MCV 88.1 84.8 85.6  PLT 309 334 309   Cardiac Enzymes:  Recent Labs Lab 09/28/14 0352  CKTOTAL 573*   BNP: BNP (last 3 results) No results for input(s): BNP in the last 8760 hours.  ProBNP (last 3 results) No results for input(s): PROBNP in the last 8760 hours.  CBG:  Recent Labs Lab 10/02/14 1206 10/02/14 1649 10/02/14 2034 10/03/14 0739 10/03/14 1201  GLUCAP 168* 142* 167* 156* 138*       Signed:  Mahari Vankirk K  [facility] 10/03/2014, 4:45 PM

## 2014-10-05 NOTE — Telephone Encounter (Signed)
Forms received back from Dr. Milus Glazier. They will be faxed on 10/06/2014

## 2014-10-07 ENCOUNTER — Other Ambulatory Visit: Payer: Self-pay | Admitting: Physician Assistant

## 2014-10-07 ENCOUNTER — Other Ambulatory Visit: Payer: Self-pay | Admitting: Urgent Care

## 2014-10-09 NOTE — Telephone Encounter (Signed)
Dr L, you just saw pt for check up but don't see depression discussed. OK to RF?

## 2014-10-09 NOTE — Telephone Encounter (Signed)
Faxed

## 2014-10-09 NOTE — Telephone Encounter (Signed)
Rx sent. Dr L, just approved alprazolam and I will send this in as well.

## 2014-10-12 ENCOUNTER — Ambulatory Visit (INDEPENDENT_AMBULATORY_CARE_PROVIDER_SITE_OTHER): Payer: Managed Care, Other (non HMO) | Admitting: Family Medicine

## 2014-10-12 VITALS — BP 128/82 | HR 96 | Temp 98.0°F | Resp 17 | Ht 73.0 in | Wt 274.0 lb

## 2014-10-12 DIAGNOSIS — F4323 Adjustment disorder with mixed anxiety and depressed mood: Secondary | ICD-10-CM | POA: Diagnosis not present

## 2014-10-12 DIAGNOSIS — M791 Myalgia, unspecified site: Secondary | ICD-10-CM

## 2014-10-12 DIAGNOSIS — R110 Nausea: Secondary | ICD-10-CM

## 2014-10-12 DIAGNOSIS — G4459 Other complicated headache syndrome: Secondary | ICD-10-CM

## 2014-10-12 MED ORDER — CITALOPRAM HYDROBROMIDE 20 MG PO TABS: 20.0000 mg | ORAL_TABLET | Freq: Every day | ORAL | Status: DC

## 2014-10-12 MED ORDER — HYDROMORPHONE HCL 2 MG PO TABS: 2.0000 mg | ORAL_TABLET | Freq: Four times a day (QID) | ORAL | Status: DC | PRN

## 2014-10-12 MED ORDER — PROMETHAZINE HCL 12.5 MG PO TABS: 12.5000 mg | ORAL_TABLET | Freq: Four times a day (QID) | ORAL | Status: DC | PRN

## 2014-10-12 MED ORDER — ALPRAZOLAM 0.5 MG PO TABS: ORAL_TABLET | ORAL | Status: DC

## 2014-10-12 MED ORDER — CYCLOBENZAPRINE HCL 10 MG PO TABS: 10.0000 mg | ORAL_TABLET | Freq: Every day | ORAL | Status: DC

## 2014-10-12 MED ORDER — METOCLOPRAMIDE HCL 5 MG PO TABS: 5.0000 mg | ORAL_TABLET | Freq: Four times a day (QID) | ORAL | Status: DC

## 2014-10-12 NOTE — Patient Instructions (Signed)
Restart the lisinopril 40 mg daily  Me know if the blood pressure does not come down to less than 140/80 consistently over the next 2 weeks

## 2014-10-12 NOTE — Progress Notes (Signed)
Subjective:    Patient ID: Russell [name], male    DOB: 11-26-1970, 44 y.o.   MRN: [id number] This chart was scribed for [name], MD by Jolene Provost, Medical Scribe. This patient was seen in Room 14 and the patient's care was started a 4:31 PM.  Chief Complaint  Patient presents with  . Follow-up    celexa,xanax,flexeril     HPI HPI Comments: [name] is a 44 y.o. male who presents to [facility] reporting for a medication refill. Pt states his kidneys completely shut down 1.5 months ago and he was hospitalized for one month. He states his weight got to 307lbs due to swelling from fluid before going to the ER. He was admitted at [facility] initially, but was transferred to [facility] due to the severity of his sx. He states he has been having intermittent severe HA, which were treated in the ER with dilaudid. He is not taking Reglan because he is out. He states he only has HA when his blood pressure is high.   The pt has been disabled from work for some time, and is applying for permanent disability.    Review of Systems  Constitutional: Positive for unexpected weight change. Negative for fever and chills.  Cardiovascular: Positive for leg swelling. Negative for chest pain.  Gastrointestinal: Positive for abdominal pain.  Genitourinary: Positive for frequency. Negative for decreased urine volume.  Neurological: Positive for headaches.       Objective:   Physical Exam  Constitutional: He is oriented to person, place, and time. He appears well-developed and well-nourished. No distress.  HENT:  Head: Normocephalic and atraumatic.  Eyes: Pupils are equal, round, and reactive to light.  Neck: Neck supple.  Cardiovascular: Normal rate.   Pulmonary/Chest: Effort normal. No respiratory distress. He has wheezes.  Musculoskeletal: Normal range of motion.  Neurological: He is alert and oriented to person, place, and time. Coordination normal.  Skin: Skin is warm and dry. He is not  diaphoretic.  Psychiatric: He has a normal mood and affect. His behavior is normal.  Nursing note and vitals reviewed.   Filed Vitals:   10/12/14 1614  BP: 128/82  Pulse: 96  Temp: 98 F (36.7 C)  TempSrc: Oral  Resp: 17  Height:  (1.854 m)  Weight: 274 lb (124.286 kg)  SpO2: 96%   BP recheck by Dr. Milus Glazier, 168/104.  Chest: Bilateral inspiratory and expiratory basilar wheezes. Extremities show 2+ pedal edema without ecchymosis or stasis changes Results for orders placed or performed during the hospital encounter of 09/30/14  Lipase, blood  Result Value Ref Range   Lipase 16 (L) 22 - 51 U/L  Comprehensive metabolic panel  Result Value Ref Range   Sodium 137 135 - 145 mmol/L   Potassium 4.1 3.5 - 5.1 mmol/L   Chloride 97 (L) 101 - 111 mmol/L   CO2 27 22 - 32 mmol/L   Glucose, Bld 313 (H) 65 - 99 mg/dL   BUN 47 (H) 6 - 20 mg/dL   Creatinine, Ser 4.01 (H) 0.61 - 1.24 mg/dL   Calcium 9.2 8.9 - 02.7 mg/dL   Total Protein 6.1 (L) 6.5 - 8.1 g/dL   Albumin 2.6 (L) 3.5 - 5.0 g/dL   AST 35 15 - 41 U/L   ALT 36 17 - 63 U/L   Alkaline Phosphatase 72 38 - 126 U/L   Total Bilirubin 1.1 0.3 - 1.2 mg/dL   GFR calc non Af Amer 24 (L) >60  mL/min   GFR calc Af Amer 27 (L) >60 mL/min   Anion gap 13 5 - 15  CBC  Result Value Ref Range   WBC 7.6 4.0 - 10.5 K/uL   RBC 4.94 4.22 - 5.81 MIL/uL   Hemoglobin 14.2 13.0 - 17.0 g/dL   HCT 57.8 46.9 - 62.9 %   MCV 84.8 78.0 - 100.0 fL   MCH 28.7 26.0 - 34.0 pg   MCHC 33.9 30.0 - 36.0 g/dL   RDW 52.8 41.3 - 24.4 %   Platelets 334 150 - 400 K/uL  Urinalysis, Routine w reflex microscopic (not at [facility])  Result Value Ref Range   Color, Urine YELLOW YELLOW   APPearance CLEAR CLEAR   Specific Gravity, Urine 1.016 1.005 - 1.030   pH 7.5 5.0 - 8.0   Glucose, UA 500 (A) NEGATIVE mg/dL   Hgb urine dipstick MODERATE (A) NEGATIVE   Bilirubin Urine NEGATIVE NEGATIVE   Ketones, ur 15 (A) NEGATIVE mg/dL   Protein, ur >010 (A) NEGATIVE mg/dL    Urobilinogen, UA 0.2 0.0 - 1.0 mg/dL   Nitrite NEGATIVE NEGATIVE   Leukocytes, UA NEGATIVE NEGATIVE  Urine rapid drug screen (hosp performed)  Result Value Ref Range   Opiates POSITIVE (A) NONE DETECTED   Cocaine NONE DETECTED NONE DETECTED   Benzodiazepines NONE DETECTED NONE DETECTED   Amphetamines NONE DETECTED NONE DETECTED   Tetrahydrocannabinol NONE DETECTED NONE DETECTED   Barbiturates NONE DETECTED NONE DETECTED  Magnesium  Result Value Ref Range   Magnesium 1.7 1.7 - 2.4 mg/dL  Comprehensive metabolic panel  Result Value Ref Range   Sodium 137 135 - 145 mmol/L   Potassium 3.5 3.5 - 5.1 mmol/L   Chloride 97 (L) 101 - 111 mmol/L   CO2 28 22 - 32 mmol/L   Glucose, Bld 212 (H) 65 - 99 mg/dL   BUN 46 (H) 6 - 20 mg/dL   Creatinine, Ser 2.72 (H) 0.61 - 1.24 mg/dL   Calcium 8.7 (L) 8.9 - 10.3 mg/dL   Total Protein 5.6 (L) 6.5 - 8.1 g/dL   Albumin 2.4 (L) 3.5 - 5.0 g/dL   AST 34 15 - 41 U/L   ALT 33 17 - 63 U/L   Alkaline Phosphatase 66 38 - 126 U/L   Total Bilirubin 0.8 0.3 - 1.2 mg/dL   GFR calc non Af Amer 22 (L) >60 mL/min   GFR calc Af Amer 26 (L) >60 mL/min   Anion gap 12 5 - 15  Urine microscopic-add on  Result Value Ref Range   Squamous Epithelial / LPF RARE RARE   WBC, UA 0-2 <3 WBC/hpf   RBC / HPF 0-2 <3 RBC/hpf   Casts HYALINE CASTS (A) NEGATIVE  Glucose, capillary  Result Value Ref Range   Glucose-Capillary 247 (H) 65 - 99 mg/dL  Glucose, capillary  Result Value Ref Range   Glucose-Capillary 246 (H) 65 - 99 mg/dL  Glucose, capillary  Result Value Ref Range   Glucose-Capillary 257 (H) 65 - 99 mg/dL  Glucose, capillary  Result Value Ref Range   Glucose-Capillary 210 (H) 65 - 99 mg/dL  Glucose, capillary  Result Value Ref Range   Glucose-Capillary 215 (H) 65 - 99 mg/dL  Basic metabolic panel  Result Value Ref Range   Sodium 138 135 - 145 mmol/L   Potassium 3.2 (L) 3.5 - 5.1 mmol/L   Chloride 101 101 - 111 mmol/L   CO2 29 22 - 32 mmol/L    Glucose, Bld  114 (H) 65 - 99 mg/dL   BUN 37 (H) 6 - 20 mg/dL   Creatinine, Ser 9.60 (H) 0.61 - 1.24 mg/dL   Calcium 8.4 (L) 8.9 - 10.3 mg/dL   GFR calc non Af Amer 26 (L) >60 mL/min   GFR calc Af Amer 30 (L) >60 mL/min   Anion gap 8 5 - 15  CBC  Result Value Ref Range   WBC 8.4 4.0 - 10.5 K/uL   RBC 4.24 4.22 - 5.81 MIL/uL   Hemoglobin 12.3 (L) 13.0 - 17.0 g/dL   HCT 45.4 (L) 09.8 - 11.9 %   MCV 85.6 78.0 - 100.0 fL   MCH 29.0 26.0 - 34.0 pg   MCHC 33.9 30.0 - 36.0 g/dL   RDW 14.7 82.9 - 56.2 %   Platelets 309 150 - 400 K/uL  Glucose, capillary  Result Value Ref Range   Glucose-Capillary 119 (H) 65 - 99 mg/dL  Magnesium  Result Value Ref Range   Magnesium 2.3 1.7 - 2.4 mg/dL  Glucose, capillary  Result Value Ref Range   Glucose-Capillary 168 (H) 65 - 99 mg/dL  Glucose, capillary  Result Value Ref Range   Glucose-Capillary 142 (H) 65 - 99 mg/dL  Glucose, capillary  Result Value Ref Range   Glucose-Capillary 167 (H) 65 - 99 mg/dL  Basic metabolic panel  Result Value Ref Range   Sodium 136 135 - 145 mmol/L   Potassium 3.5 3.5 - 5.1 mmol/L   Chloride 103 101 - 111 mmol/L   CO2 26 22 - 32 mmol/L   Glucose, Bld 151 (H) 65 - 99 mg/dL   BUN 30 (H) 6 - 20 mg/dL   Creatinine, Ser 1.30 (H) 0.61 - 1.24 mg/dL   Calcium 7.8 (L) 8.9 - 10.3 mg/dL   GFR calc non Af Amer 28 (L) >60 mL/min   GFR calc Af Amer 32 (L) >60 mL/min   Anion gap 7 5 - 15  Glucose, capillary  Result Value Ref Range   Glucose-Capillary 156 (H) 65 - 99 mg/dL  Glucose, capillary  Result Value Ref Range   Glucose-Capillary 138 (H) 65 - 99 mg/dL  CBG monitoring, ED  Result Value Ref Range   Glucose-Capillary 306 (H) 65 - 99 mg/dL  I-stat troponin, ED  Result Value Ref Range   Troponin i, poc 0.00 0.00 - 0.08 ng/mL   Comment 3          CBG monitoring, ED  Result Value Ref Range   Glucose-Capillary 323 (H) 65 - 99 mg/dL       Assessment & Plan:    This chart was scribed in my presence and reviewed by  me personally.    ICD-9-CM ICD-10-CM   1. Other complicated headache syndrome 339.44 G44.59 HYDROmorphone (DILAUDID) 2 MG tablet  2. Nausea without vomiting 787.02 R11.0 promethazine (PHENERGAN) 12.5 MG tablet     metoCLOPramide (REGLAN) 5 MG tablet  3. Adjustment disorder with mixed anxiety and depressed mood 309.28 F43.23 ALPRAZolam (XANAX) 0.5 MG tablet     citalopram (CELEXA) 20 MG tablet  4. Myalgia 729.1 M79.1 cyclobenzaprine (FLEXERIL) 10 MG tablet     Signed, [name], MD   By signing my name below, I, Javier Docker, attest that this documentation has been prepared under the direction and in the presence of [name], MD. Electronically Signed: Javier Docker, ER Scribe. 10/12/2014. 4:31 PM.

## 2014-10-13 NOTE — Progress Notes (Signed)
HPI: FU edema. Patient treated for bilateral lower extremity cellulitis in January 2016; venous ultrasound of lower extremities showed no DVT. 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 Possible superficial thrombophlebitis. Admitted in March 2016 with nausea and vomiting. Normal gastric emptying study. Right upper quadrant ultrasound unremarkable. EGD showed severe erosive esophagitis. Chest x-ray April 2016 showed no congestive heart failure. BUN and creatinine 15 and 0.98. Albumin 2.8. Normal TSH. Cortisol level 6.9. VQ scan April 2016 normal. 24 hour urine for protein April 2016 showed 7500 mg. Echocardiogram May 2016 showed normal LV function. There was moderate left ventricular hypertrophy. Nuclear study June 2016 showed artifact but no ischemia. Ejection fraction calculated at 42% but there was note of previous normal LV function on echocardiogram. MRA September 2016 showed no renal artery stenosis. Since last seen,   Current Outpatient Prescriptions  Medication Sig Dispense Refill  . acetaminophen (TYLENOL) 500 MG tablet Take 1 tablet (500 mg total) by mouth every 6 (six) hours as needed for moderate pain. 30 tablet 0  . albuterol (PROVENTIL HFA;VENTOLIN HFA) 108 (90 BASE) MCG/ACT inhaler Inhale 1 puff into the lungs every 6 (six) hours as needed for wheezing or shortness of breath.    Marland Kitchen albuterol (PROVENTIL) (2.5 MG/3ML) 0.083% nebulizer solution Take 3 mLs (2.5 mg total) by nebulization every 6 (six) hours as needed for wheezing or shortness of breath. 75 mL 12  . albuterol-ipratropium (COMBIVENT) 18-103 MCG/ACT inhaler Inhale 2 puffs into the lungs every 4 (four) hours. 1 Inhaler 11  . ALPRAZolam (XANAX) 0.5 MG tablet TAKE 1 TABLET TWICE DAILY AS NEEDED FOR SLEEP 60 tablet 0  . citalopram (CELEXA) 20 MG tablet Take 1 tablet (20 mg total) by mouth daily. 90 tablet 1  . cyclobenzaprine (FLEXERIL) 10 MG tablet Take 1 tablet (10 mg total) by mouth at bedtime.  30 tablet 6  . gabapentin (NEURONTIN) 100 MG capsule Take 1 capsule (100 mg total) by mouth at bedtime. 30 capsule 0  . HYDROmorphone (DILAUDID) 2 MG tablet Take 1 tablet (2 mg total) by mouth every 6 (six) hours as needed for severe pain. 30 tablet 0  . insulin glargine (LANTUS) 100 UNIT/ML injection Inject 0.15 mLs (15 Units total) into the skin 2 (two) times daily. (Patient taking differently: Inject 30 Units into the skin daily. ) 10 mL 5  . Insulin Lispro, Human, (HUMALOG KWIKPEN) 200 UNIT/ML SOPN Inject 33 Units into the skin 3 (three) times daily before meals. (Patient taking differently: Inject 15-80 Units into the skin 3 (three) times daily before meals. Per sliding scale) 90 mL 1  . levothyroxine (SYNTHROID, LEVOTHROID) 50 MCG tablet Take 1 tablet (50 mcg total) by mouth daily. 90 tablet 3  . metoCLOPramide (REGLAN) 5 MG tablet Take 1 tablet (5 mg total) by mouth 4 (four) times daily. 30 tablet 0  . metoprolol succinate (TOPROL-XL) 100 MG 24 hr tablet Take 1 tablet (100 mg total) by mouth daily. 90 tablet 3  . promethazine (PHENERGAN) 12.5 MG tablet Take 1 tablet (12.5 mg total) by mouth every 6 (six) hours as needed for nausea or vomiting. 20 tablet 0  . promethazine (PHENERGAN) 25 MG tablet Take 1 tablet (25 mg total) by mouth every 6 (six) hours as needed for nausea. 20 tablet 0   No current facility-administered medications for this visit.     Past Medical History  Diagnosis Date  . Thyroid disease   . Diabetes mellitus without  complication (HCC)   . Hypertension   . Nephrotic syndrome 05/18/2014  . DVT (deep venous thrombosis), H/o 01/2014-on Xarelto 03/24/2014  . Secondary DM with DKA-AG=16, BIcarb Nl 03/24/2014    Past Surgical History  Procedure Laterality Date  . Right ankle fx repair    . Eye surgery    . Esophagogastroduodenoscopy N/A 03/29/2014    Procedure: ESOPHAGOGASTRODUODENOSCOPY (EGD);  Surgeon: Dorena Cookey, MD;  Location: [facility];  Service: Endoscopy;   Laterality: N/A;  . Tonsillectomy and adenoidectomy      Social History   Social History  . Marital Status: Single    Spouse Name: N/A  . Number of Children: N/A  . Years of Education: N/A   Occupational History  .      Maintenance Curator   Social History Main Topics  . Smoking status: Never Smoker   . Smokeless tobacco: Never Used  . Alcohol Use: No  . Drug Use: No  . Sexual Activity: Yes   Other Topics Concern  . Not on file   Social History Narrative   Patient currently lives with his fiance   Works with facilities   Nonsmoker, nondrinker       ROS: no fevers or chills, productive cough, hemoptysis, dysphasia, odynophagia, melena, hematochezia, dysuria, hematuria, rash, seizure activity, orthopnea, PND, pedal edema, claudication. Remaining systems are negative.  Physical Exam: Well-developed well-nourished in no acute distress.  Skin is warm and dry.  HEENT is normal.  Neck is supple.  Chest is clear to auscultation with normal expansion.  Cardiovascular exam is regular rate and rhythm.  Abdominal exam nontender or distended. No masses palpated. Extremities show no edema. neuro grossly intact  ECG     This encounter was created in error - please disregard.

## 2014-10-16 ENCOUNTER — Encounter: Payer: Managed Care, Other (non HMO) | Admitting: Cardiology

## 2014-10-29 ENCOUNTER — Ambulatory Visit (INDEPENDENT_AMBULATORY_CARE_PROVIDER_SITE_OTHER): Payer: Managed Care, Other (non HMO) | Admitting: Internal Medicine

## 2014-10-29 ENCOUNTER — Encounter: Payer: Self-pay | Admitting: Internal Medicine

## 2014-10-29 VITALS — BP 132/84 | HR 79 | Temp 97.8°F | Resp 12 | Wt 263.0 lb

## 2014-10-29 DIAGNOSIS — IMO0002 Reserved for concepts with insufficient information to code with codable children: Secondary | ICD-10-CM

## 2014-10-29 DIAGNOSIS — E1165 Type 2 diabetes mellitus with hyperglycemia: Secondary | ICD-10-CM | POA: Diagnosis not present

## 2014-10-29 DIAGNOSIS — E039 Hypothyroidism, unspecified: Secondary | ICD-10-CM | POA: Diagnosis not present

## 2014-10-29 DIAGNOSIS — E1151 Type 2 diabetes mellitus with diabetic peripheral angiopathy without gangrene: Secondary | ICD-10-CM | POA: Diagnosis not present

## 2014-10-29 NOTE — Progress Notes (Signed)
Patient ID: Russell [name], male   DOB: Feb 05, 1970, 44 y.o.   MRN: [id number]  HPI: Russell [name] is a 44 y.o.-year-old male, returning for f/u for DM2, dx in 2011, insulin-dependent 2014, uncontrolled, with complications (DR, PN, hospitalized for DKA in 03/2014, nephrotic sd.). Last visit 3 mo ago.  Since last visit, he was hospitalized again (in and out of the hospital) for acute on chronic renal failure. He has been home for the last 10 days.   He has massive fluctuations in his fluid status - up to 50 lbs.   Sugars higher since last visit >> HbA1c increased again at last check, 1 mo ago.  Last hemoglobin A1c was: Lab Results  Component Value Date   HGBA1C 10.3* 09/21/2014   HGBA1C 8.4 05/26/2014   HGBA1C 10.6* 03/25/2014   Pt is on a regimen of: - Lantus 30 units in am - Humalog 200 - ICR 1:5, target 120, ISF 10 (using size of the meal now: 20-30 units a meal)  He was was on Metformin 1000 mg 2x a day with breakfast and dinner - added 05/2014 >> stopped at the last hosp., but advised now he can start back.  Pt checks his sugars 2-6x a day: - am: 147-210 >> 93-140 - 2h after b'fast: 250, 266 >> n/c - before lunch: 138, 176-292 >> 162 - 2h after lunch: 83-330 >> 218, 246 - before dinner: 133-234 >> 155-178 - 2h after dinner: 84, 113-288 >> 151, 201, 236 - bedtime: 88-199 >> 94 - nighttime: n/c No lows. Lowest sugar was 80s; he has hypoglycemia awareness at 70.  Highest sugar was 300s.  Glucometer: [employer], physiologicalTouch Expert.  Pt's meals are: - Breakfast: eggs, sausage, bacon, toast - Lunch: salad - Dinner: meat + veggies - Snacks: meat, cheese rolls  - He has CKD:  Lab Results  Component Value Date   BUN 30* 10/03/2014   CREATININE 2.64* 10/03/2014  On Lisinopril. - last set of lipids: Lab Results  Component Value Date   CHOL 178 09/12/2013   HDL 42 09/12/2013   LDLCALC 98 09/12/2013   TRIG 189* 09/12/2013   CHOLHDL 4.2 09/12/2013  Not on a statin. Simvastatin >>  irritability.  - last eye exam was in 04/2014.  Dr Allyne GeeSanders. + DR. He had eye surgery 04/2014. - + numbness and tingling in his feet. On Neurontin.  He has a h/o horseshoe kidney.  He has hypothyroidism: - dx in 2015 - on LT4 50 mcg daily: - daily - in am - 30 min before a meal - no PPI, no MVI, no Ca or Fe - on Carafate - at nighttime  Last TSH high as he did not receive LT4 inhouse consistently. Lab Results  Component Value Date   TSH 5.092* 09/22/2014   Also has HTN, HL.  ROS: Constitutional: + weight gain + loss, + fatigue, no hot flushes, no poor sleep, + nocturia Eyes: no blurry vision, no xerophthalmia ENT: no sore throat, no nodules palpated in throat, no dysphagia/odynophagia, no hoarseness Cardiovascular: no CP/+ SOB/no palpitations/+ leg swelling Respiratory: no cough/+ SOB/+ wheezing Gastrointestinal: no N/+ V/no D/C/heartburn Musculoskeletal: + muscle aches/+ joint aches Skin: + rash on legs Neurological: no tremors/numbness/tingling/dizziness  I reviewed pt's medications, allergies, PMH, social hx, family hx, and changes were documented in the history of present illness. On  Otherwise, unchanged from my initial visit note:  Past Medical History  Diagnosis Date  . Thyroid disease   . Diabetes mellitus without complication (  HCC)   . Hypertension   . Nephrotic syndrome 05/18/2014  . DVT (deep venous thrombosis), H/o 01/2014-on Xarelto 03/24/2014  . Secondary DM with DKA-AG=16, BIcarb Nl 03/24/2014   Past Surgical History  Procedure Laterality Date  . Right ankle fx repair    . Eye surgery    . Esophagogastroduodenoscopy N/A 03/29/2014    Procedure: ESOPHAGOGASTRODUODENOSCOPY (EGD);  Surgeon: Dorena Cookey, MD;  Location: [facility];  Service: Endoscopy;  Laterality: N/A;  . Tonsillectomy and adenoidectomy     History   Social History  . Marital Status: Single    Spouse Name: N/A  . Number of Children: no   Occupational History  .      Maintenance  Curator   Social History Main Topics  . Smoking status: Never Smoker   . Smokeless tobacco: Not on file  . Alcohol Use: No  . Drug Use: No  . Sexual Activity: Yes   Social History Narrative   Patient currently lives with his fiance   Works with facilities   Nonsmoker, nondrinker   Current Outpatient Prescriptions on File Prior to Visit  Medication Sig Dispense Refill  . acetaminophen (TYLENOL) 500 MG tablet Take 1 tablet (500 mg total) by mouth every 6 (six) hours as needed for moderate pain. 30 tablet 0  . albuterol (PROVENTIL HFA;VENTOLIN HFA) 108 (90 BASE) MCG/ACT inhaler Inhale 1 puff into the lungs every 6 (six) hours as needed for wheezing or shortness of breath.    Marland Kitchen albuterol (PROVENTIL) (2.5 MG/3ML) 0.083% nebulizer solution Take 3 mLs (2.5 mg total) by nebulization every 6 (six) hours as needed for wheezing or shortness of breath. 75 mL 12  . albuterol-ipratropium (COMBIVENT) 18-103 MCG/ACT inhaler Inhale 2 puffs into the lungs every 4 (four) hours. 1 Inhaler 11  . ALPRAZolam (XANAX) 0.5 MG tablet TAKE 1 TABLET TWICE DAILY AS NEEDED FOR SLEEP 60 tablet 0  . citalopram (CELEXA) 20 MG tablet Take 1 tablet (20 mg total) by mouth daily. 90 tablet 1  . cyclobenzaprine (FLEXERIL) 10 MG tablet Take 1 tablet (10 mg total) by mouth at bedtime. 30 tablet 6  . gabapentin (NEURONTIN) 100 MG capsule Take 1 capsule (100 mg total) by mouth at bedtime. 30 capsule 0  . HYDROmorphone (DILAUDID) 2 MG tablet Take 1 tablet (2 mg total) by mouth every 6 (six) hours as needed for severe pain. 30 tablet 0  . insulin glargine (LANTUS) 100 UNIT/ML injection Inject 0.15 mLs (15 Units total) into the skin 2 (two) times daily. (Patient taking differently: Inject 30 Units into the skin daily. ) 10 mL 5  . Insulin Lispro, Human, (HUMALOG KWIKPEN) 200 UNIT/ML SOPN Inject 33 Units into the skin 3 (three) times daily before meals. (Patient taking differently: Inject 15-80 Units into the skin 3 (three) times  daily before meals. Per sliding scale) 90 mL 1  . levothyroxine (SYNTHROID, LEVOTHROID) 50 MCG tablet Take 1 tablet (50 mcg total) by mouth daily. 90 tablet 3  . metoCLOPramide (REGLAN) 5 MG tablet Take 1 tablet (5 mg total) by mouth 4 (four) times daily. 30 tablet 0  . metoprolol succinate (TOPROL-XL) 100 MG 24 hr tablet Take 1 tablet (100 mg total) by mouth daily. 90 tablet 3  . promethazine (PHENERGAN) 12.5 MG tablet Take 1 tablet (12.5 mg total) by mouth every 6 (six) hours as needed for nausea or vomiting. 20 tablet 0  . promethazine (PHENERGAN) 25 MG tablet Take 1 tablet (25 mg total) by mouth every 6 (six)  hours as needed for nausea. 20 tablet 0   No current facility-administered medications on file prior to visit.   Allergies  Allergen Reactions  . Ibuprofen Other (See Comments)    MD told him not to take due to kidney disease.   Family History  Problem Relation Age of Onset  . Obesity Mother     Patient states that family members have no other medical illnesses other than what I have described  . Heart disease      No family history  . Cancer Father 50    AML   PE: BP 132/84 mmHg  Pulse 79  Temp(Src) 97.8 F (36.6 C) (Oral)  Resp 12  Wt 263 lb (119.296 kg)  SpO2 96% Body mass index is 34.71 kg/(m^2). Wt Readings from Last 3 Encounters:  10/29/14 263 lb (119.296 kg)  10/12/14 274 lb (124.286 kg)  10/02/14 256 lb 6.3 oz (116.3 kg)   Constitutional: overweight, in NAD Eyes: PERRLA, EOMI, no exophthalmos ENT: moist mucous membranes, no thyromegaly, no cervical lymphadenopathy Cardiovascular: RRR, No MRG, no LE edema  Respiratory: CTA B Gastrointestinal: abdomen soft, NT, ND, BS+ Musculoskeletal: no deformities, strength intact in all 4 Skin: moist, warm, no rashes Neurological: no tremor with outstretched hands, DTR normal in all 4  ASSESSMENT: 1. DM2, insulin-dependent, uncontrolled, with complications - DR - PN - nephrotic sd. >> sees nephrology at [facility],  will start seeing Dr. Reginal Lutes   2. Hypothyroidism  PLAN:  1. Patient with uncontrolled diabetes, on basal-bolus antidiabetic regimen. Sugars worse as he has A on CRF and was in and out of the hospital. He came home from the hospital <2 weeks ago >> sugars improved, but still high after lunch and they decrease after dinner >> will increase ISF as he also had a low CBG (48) at night) and will decrease ICR with lunch so he gets better coverage with this meal. - I suggested to:  Patient Instructions  Please continue: - Lantus 30 units in am  Continue: - Humalog U200 - ICR 1:5, except 1:4 with lunch, target 120  Please change: - Humalog ISF 10 >> 20  Please return in 2 months with your sugar log.   - continue checking sugars at different times of the day - check 2-3 times a day, rotating checks - advised for yearly eye exams >> he is UTD - got the flu shot this season - Return to clinic in 3 mo with sugar log   2. Hypothyroidism - last TSH 1 mo ago (in the hospital) was slightly elevated, but he tells me he did not get LT4 consistently inhouse - takes the thyroid hormone every day, with water, >30 minutes before breakfast, separated by >4 hours from acid reflux medications, calcium, iron, multivitamins. - continue LT4 50 mcg daily - will check TFTs at next visit  - time spent with the patient: 40 min, of which >50% was spent in obtaining information about his symptoms, reviewing his previous hospitalization records, labs, evaluations, and treatments, counseling him about his condition (please see the discussed topics above), and developing a plan to improve his DM control; he had a number of questions which I addressed.

## 2014-10-29 NOTE — Patient Instructions (Signed)
Please continue: - Lantus 30 units in am  Continue: - Humalog U200 - ICR 1:5, except 1:4 with lunch, target 120  Please change: - Humalog ISF 10 >> 20  Please return in 2 months with your sugar log.

## 2014-11-04 ENCOUNTER — Other Ambulatory Visit: Payer: Self-pay | Admitting: *Deleted

## 2014-11-04 DIAGNOSIS — E1310 Other specified diabetes mellitus with ketoacidosis without coma: Secondary | ICD-10-CM

## 2014-11-04 MED ORDER — LANTUS SOLOSTAR 100 UNIT/ML ~~LOC~~ SOPN: 30.0000 [IU] | PEN_INJECTOR | Freq: Two times a day (BID) | SUBCUTANEOUS | Status: DC

## 2014-11-17 ENCOUNTER — Encounter (HOSPITAL_COMMUNITY): Payer: Self-pay | Admitting: Emergency Medicine

## 2014-11-17 ENCOUNTER — Inpatient Hospital Stay (HOSPITAL_COMMUNITY)
Admission: EM | Admit: 2014-11-17 | Discharge: 2014-11-21 | DRG: 637 | Disposition: A | Discharge status: Home / Self Care | Payer: Managed Care, Other (non HMO) | Attending: Family Medicine | Admitting: Family Medicine

## 2014-11-17 DIAGNOSIS — E038 Other specified hypothyroidism: Secondary | ICD-10-CM | POA: Diagnosis not present

## 2014-11-17 DIAGNOSIS — N183 Chronic kidney disease, stage 3 (moderate): Secondary | ICD-10-CM | POA: Diagnosis present

## 2014-11-17 DIAGNOSIS — I4581 Long QT syndrome: Secondary | ICD-10-CM

## 2014-11-17 DIAGNOSIS — R110 Nausea: Secondary | ICD-10-CM | POA: Diagnosis not present

## 2014-11-17 DIAGNOSIS — Z7901 Long term (current) use of anticoagulants: Secondary | ICD-10-CM | POA: Diagnosis not present

## 2014-11-17 DIAGNOSIS — R1110 Vomiting, unspecified: Secondary | ICD-10-CM

## 2014-11-17 DIAGNOSIS — Z794 Long term (current) use of insulin: Secondary | ICD-10-CM | POA: Diagnosis not present

## 2014-11-17 DIAGNOSIS — E1151 Type 2 diabetes mellitus with diabetic peripheral angiopathy without gangrene: Secondary | ICD-10-CM | POA: Diagnosis present

## 2014-11-17 DIAGNOSIS — Q631 Lobulated, fused and horseshoe kidney: Secondary | ICD-10-CM

## 2014-11-17 DIAGNOSIS — I10 Essential (primary) hypertension: Secondary | ICD-10-CM | POA: Insufficient documentation

## 2014-11-17 DIAGNOSIS — Z86718 Personal history of other venous thrombosis and embolism: Secondary | ICD-10-CM

## 2014-11-17 DIAGNOSIS — I160 Hypertensive urgency: Secondary | ICD-10-CM | POA: Diagnosis present

## 2014-11-17 DIAGNOSIS — J189 Pneumonia, unspecified organism: Secondary | ICD-10-CM | POA: Diagnosis present

## 2014-11-17 DIAGNOSIS — K219 Gastro-esophageal reflux disease without esophagitis: Secondary | ICD-10-CM | POA: Diagnosis present

## 2014-11-17 DIAGNOSIS — E1310 Other specified diabetes mellitus with ketoacidosis without coma: Secondary | ICD-10-CM | POA: Diagnosis present

## 2014-11-17 DIAGNOSIS — N189 Chronic kidney disease, unspecified: Secondary | ICD-10-CM | POA: Insufficient documentation

## 2014-11-17 DIAGNOSIS — Z6832 Body mass index (BMI) 32.0-32.9, adult: Secondary | ICD-10-CM

## 2014-11-17 DIAGNOSIS — E039 Hypothyroidism, unspecified: Secondary | ICD-10-CM | POA: Diagnosis present

## 2014-11-17 DIAGNOSIS — E1110 Type 2 diabetes mellitus with ketoacidosis without coma: Secondary | ICD-10-CM | POA: Diagnosis present

## 2014-11-17 DIAGNOSIS — E876 Hypokalemia: Secondary | ICD-10-CM | POA: Diagnosis present

## 2014-11-17 DIAGNOSIS — R000 Tachycardia, unspecified: Secondary | ICD-10-CM | POA: Diagnosis present

## 2014-11-17 DIAGNOSIS — Z79899 Other long term (current) drug therapy: Secondary | ICD-10-CM | POA: Diagnosis not present

## 2014-11-17 DIAGNOSIS — F329 Major depressive disorder, single episode, unspecified: Secondary | ICD-10-CM | POA: Diagnosis present

## 2014-11-17 DIAGNOSIS — G43A1 Cyclical vomiting, intractable: Secondary | ICD-10-CM | POA: Diagnosis not present

## 2014-11-17 DIAGNOSIS — F419 Anxiety disorder, unspecified: Secondary | ICD-10-CM | POA: Diagnosis present

## 2014-11-17 DIAGNOSIS — E1140 Type 2 diabetes mellitus with diabetic neuropathy, unspecified: Secondary | ICD-10-CM | POA: Diagnosis present

## 2014-11-17 DIAGNOSIS — N049 Nephrotic syndrome with unspecified morphologic changes: Secondary | ICD-10-CM | POA: Diagnosis present

## 2014-11-17 LAB — URINALYSIS, ROUTINE W REFLEX MICROSCOPIC
Bilirubin Urine: NEGATIVE
Glucose, UA: >1000 mg/dL — AB
Ketones, ur: 15 mg/dL — AB
LEUKOCYTES UA: NEGATIVE
Nitrite: NEGATIVE
SPECIFIC GRAVITY, URINE: 1.03 (ref 1.005–1.030)
pH: 6.5 (ref 5.0–8.0)

## 2014-11-17 LAB — I-STAT VENOUS BLOOD GAS, ED
Acid-base deficit: 4 mmol/L — ABNORMAL HIGH (ref 0.0–2.0)
Bicarbonate: 19.5 mEq/L — ABNORMAL LOW (ref 20.0–24.0)
O2 SAT: 61 %
PCO2 VEN: 32.2 mmHg — AB (ref 45.0–50.0)
TCO2: 20 mmol/L (ref 0–100)
pH, Ven: 7.389 — ABNORMAL HIGH (ref 7.250–7.300)
pO2, Ven: 31 mmHg (ref 30.0–45.0)

## 2014-11-17 LAB — I-STAT CHEM 8, ED
BUN: 34 mg/dL — AB (ref 6–20)
CALCIUM ION: 1.14 mmol/L (ref 1.12–1.23)
CHLORIDE: 103 mmol/L (ref 101–111)
CREATININE: 2.6 mg/dL — AB (ref 0.61–1.24)
GLUCOSE: 453 mg/dL — AB (ref 65–99)
HCT: 47 % (ref 39.0–52.0)
Hemoglobin: 16 g/dL (ref 13.0–17.0)
Potassium: 3.7 mmol/L (ref 3.5–5.1)
SODIUM: 137 mmol/L (ref 135–145)
TCO2: 18 mmol/L (ref 0–100)

## 2014-11-17 LAB — CBC
HCT: 43 % (ref 39.0–52.0)
HEMOGLOBIN: 15.7 g/dL (ref 13.0–17.0)
MCH: 29.3 pg (ref 26.0–34.0)
MCHC: 36.5 g/dL — ABNORMAL HIGH (ref 30.0–36.0)
MCV: 80.2 fL (ref 78.0–100.0)
Platelets: 336 10*3/uL (ref 150–400)
RBC: 5.36 MIL/uL (ref 4.22–5.81)
RDW: 13.4 % (ref 11.5–15.5)
WBC: 11.8 10*3/uL — AB (ref 4.0–10.5)

## 2014-11-17 LAB — URINE MICROSCOPIC-ADD ON

## 2014-11-17 LAB — I-STAT TROPONIN, ED: Troponin i, poc: 0 ng/mL (ref 0.00–0.08)

## 2014-11-17 LAB — BETA-HYDROXYBUTYRIC ACID: Beta-Hydroxybutyric Acid: 2.88 mmol/L — ABNORMAL HIGH (ref 0.05–0.27)

## 2014-11-17 LAB — COMPREHENSIVE METABOLIC PANEL
ALT: 22 U/L (ref 17–63)
ANION GAP: 16 — AB (ref 5–15)
AST: 29 U/L (ref 15–41)
Albumin: 2.3 g/dL — ABNORMAL LOW (ref 3.5–5.0)
Alkaline Phosphatase: 94 U/L (ref 38–126)
BUN: 33 mg/dL — ABNORMAL HIGH (ref 6–20)
CALCIUM: 9.1 mg/dL (ref 8.9–10.3)
CHLORIDE: 101 mmol/L (ref 101–111)
CO2: 20 mmol/L — AB (ref 22–32)
Creatinine, Ser: 2.96 mg/dL — ABNORMAL HIGH (ref 0.61–1.24)
GFR, EST AFRICAN AMERICAN: 28 mL/min — AB (ref >60)
GFR, EST NON AFRICAN AMERICAN: 24 mL/min — AB (ref >60)
Glucose, Bld: 434 mg/dL — ABNORMAL HIGH (ref 65–99)
Potassium: 3.6 mmol/L (ref 3.5–5.1)
SODIUM: 137 mmol/L (ref 135–145)
Total Bilirubin: 1.1 mg/dL (ref 0.3–1.2)
Total Protein: 5.7 g/dL — ABNORMAL LOW (ref 6.5–8.1)

## 2014-11-17 LAB — I-STAT CG4 LACTIC ACID, ED: LACTIC ACID, VENOUS: 2.53 mmol/L — AB (ref 0.5–2.0)

## 2014-11-17 LAB — GLUCOSE, CAPILLARY: GLUCOSE-CAPILLARY: 381 mg/dL — AB (ref 65–99)

## 2014-11-17 LAB — CBG MONITORING, ED: GLUCOSE-CAPILLARY: 398 mg/dL — AB (ref 65–99)

## 2014-11-17 LAB — MAGNESIUM: MAGNESIUM: 1.9 mg/dL (ref 1.7–2.4)

## 2014-11-17 LAB — LIPASE, BLOOD: LIPASE: 23 U/L (ref 11–51)

## 2014-11-17 MED ORDER — PROMETHAZINE HCL 25 MG/ML IJ SOLN
25.0000 mg | Freq: Once | INTRAMUSCULAR | Status: AC
  Administered 2014-11-17: 25 mg via INTRAVENOUS
  Filled 2014-11-17: qty 1

## 2014-11-17 MED ORDER — LISINOPRIL 40 MG PO TABS
40.0000 mg | ORAL_TABLET | Freq: Every day | ORAL | Status: DC
  Administered 2014-11-18 – 2014-11-20 (×3): 40 mg via ORAL
  Filled 2014-11-17 (×2): qty 2
  Filled 2014-11-17: qty 1
  Filled 2014-11-17: qty 2

## 2014-11-17 MED ORDER — POTASSIUM CHLORIDE 10 MEQ/100ML IV SOLN
10.0000 meq | INTRAVENOUS | Status: AC
  Administered 2014-11-18 (×2): 10 meq via INTRAVENOUS
  Filled 2014-11-17 (×2): qty 100

## 2014-11-17 MED ORDER — ONDANSETRON 4 MG PO TBDP
4.0000 mg | ORAL_TABLET | Freq: Once | ORAL | Status: AC | PRN
  Administered 2014-11-17: 4 mg via ORAL

## 2014-11-17 MED ORDER — CITALOPRAM HYDROBROMIDE 20 MG PO TABS
20.0000 mg | ORAL_TABLET | Freq: Every day | ORAL | Status: DC
  Administered 2014-11-18 – 2014-11-20 (×3): 20 mg via ORAL
  Filled 2014-11-17 (×4): qty 1

## 2014-11-17 MED ORDER — HEPARIN SODIUM (PORCINE) 5000 UNIT/ML IJ SOLN
5000.0000 [IU] | Freq: Three times a day (TID) | INTRAMUSCULAR | Status: DC
  Administered 2014-11-18 (×4): 5000 [IU] via SUBCUTANEOUS
  Filled 2014-11-17 (×8): qty 1

## 2014-11-17 MED ORDER — LEVOTHYROXINE SODIUM 50 MCG PO TABS
50.0000 ug | ORAL_TABLET | Freq: Every day | ORAL | Status: DC
  Administered 2014-11-18 – 2014-11-21 (×4): 50 ug via ORAL
  Filled 2014-11-17 (×4): qty 1

## 2014-11-17 MED ORDER — SODIUM CHLORIDE 0.9 % IV BOLUS (SEPSIS): 1000.0000 mL | Freq: Once | INTRAVENOUS | Status: DC

## 2014-11-17 MED ORDER — SODIUM CHLORIDE 0.9 % IV BOLUS (SEPSIS)
1000.0000 mL | Freq: Once | INTRAVENOUS | Status: AC
Start: 2014-11-17 — End: 2014-11-17
  Administered 2014-11-17: 1000 mL via INTRAVENOUS

## 2014-11-17 MED ORDER — ONDANSETRON 4 MG PO TBDP
ORAL_TABLET | ORAL | Status: AC
  Filled 2014-11-17: qty 1

## 2014-11-17 MED ORDER — CYCLOBENZAPRINE HCL 10 MG PO TABS
10.0000 mg | ORAL_TABLET | Freq: Every day | ORAL | Status: DC
  Administered 2014-11-18 – 2014-11-20 (×3): 10 mg via ORAL
  Filled 2014-11-17 (×4): qty 1

## 2014-11-17 MED ORDER — METOCLOPRAMIDE HCL 10 MG PO TABS
5.0000 mg | ORAL_TABLET | Freq: Every day | ORAL | Status: DC | PRN
  Administered 2014-11-18: 5 mg via ORAL
  Filled 2014-11-17: qty 1

## 2014-11-17 MED ORDER — ALPRAZOLAM 0.5 MG PO TABS
0.5000 mg | ORAL_TABLET | Freq: Every evening | ORAL | Status: DC | PRN
  Filled 2014-11-17: qty 1

## 2014-11-17 MED ORDER — SODIUM CHLORIDE 0.9 % IV SOLN
INTRAVENOUS | Status: DC
  Administered 2014-11-18: 01:00:00 via INTRAVENOUS

## 2014-11-17 MED ORDER — HYDROMORPHONE HCL 1 MG/ML IJ SOLN
1.0000 mg | Freq: Once | INTRAMUSCULAR | Status: AC
  Administered 2014-11-17: 1 mg via INTRAVENOUS
  Filled 2014-11-17: qty 1

## 2014-11-17 MED ORDER — ALBUTEROL SULFATE (2.5 MG/3ML) 0.083% IN NEBU: 2.5000 mg | INHALATION_SOLUTION | Freq: Four times a day (QID) | RESPIRATORY_TRACT | Status: DC | PRN

## 2014-11-17 MED ORDER — GABAPENTIN 100 MG PO CAPS
100.0000 mg | ORAL_CAPSULE | Freq: Every day | ORAL | Status: DC
  Administered 2014-11-19 – 2014-11-20 (×2): 100 mg via ORAL
  Filled 2014-11-17 (×3): qty 1

## 2014-11-17 MED ORDER — IPRATROPIUM-ALBUTEROL 18-103 MCG/ACT IN AERO: 2.0000 | INHALATION_SPRAY | RESPIRATORY_TRACT | Status: DC

## 2014-11-17 MED ORDER — DEXTROSE-NACL 5-0.45 % IV SOLN
INTRAVENOUS | Status: DC
  Administered 2014-11-18: 04:00:00 via INTRAVENOUS

## 2014-11-17 MED ORDER — MORPHINE SULFATE (PF) 4 MG/ML IV SOLN
4.0000 mg | Freq: Once | INTRAVENOUS | Status: AC
  Administered 2014-11-17: 4 mg via INTRAVENOUS
  Filled 2014-11-17: qty 1

## 2014-11-17 MED ORDER — SODIUM CHLORIDE 0.9 % IV SOLN
INTRAVENOUS | Status: AC
  Administered 2014-11-18: 01:00:00 via INTRAVENOUS

## 2014-11-17 MED ORDER — IPRATROPIUM-ALBUTEROL 0.5-2.5 (3) MG/3ML IN SOLN
3.0000 mL | RESPIRATORY_TRACT | Status: DC
  Filled 2014-11-17: qty 3

## 2014-11-17 MED ORDER — LABETALOL HCL 5 MG/ML IV SOLN
10.0000 mg | Freq: Once | INTRAVENOUS | Status: AC
  Administered 2014-11-17: 10 mg via INTRAVENOUS

## 2014-11-17 MED ORDER — METOPROLOL SUCCINATE ER 100 MG PO TB24
100.0000 mg | ORAL_TABLET | Freq: Every day | ORAL | Status: DC
  Administered 2014-11-18 – 2014-11-20 (×3): 100 mg via ORAL
  Filled 2014-11-17 (×4): qty 1

## 2014-11-17 MED ORDER — METOCLOPRAMIDE HCL 5 MG/ML IJ SOLN
10.0000 mg | Freq: Once | INTRAMUSCULAR | Status: AC
  Administered 2014-11-17: 10 mg via INTRAVENOUS
  Filled 2014-11-17: qty 2

## 2014-11-17 MED ORDER — LABETALOL HCL 5 MG/ML IV SOLN
20.0000 mg | Freq: Once | INTRAVENOUS | Status: DC
  Filled 2014-11-17: qty 4

## 2014-11-17 MED ORDER — SODIUM CHLORIDE 0.9 % IV SOLN
INTRAVENOUS | Status: AC
  Administered 2014-11-17: 23:00:00 via INTRAVENOUS

## 2014-11-17 MED ORDER — DIPHENHYDRAMINE HCL 50 MG/ML IJ SOLN
25.0000 mg | Freq: Once | INTRAMUSCULAR | Status: AC
  Administered 2014-11-17: 25 mg via INTRAVENOUS
  Filled 2014-11-17: qty 1

## 2014-11-17 MED ORDER — MAGNESIUM SULFATE 2 GM/50ML IV SOLN
2.0000 g | Freq: Once | INTRAVENOUS | Status: AC
  Administered 2014-11-17: 2 g via INTRAVENOUS
  Filled 2014-11-17: qty 50

## 2014-11-17 MED ORDER — PROMETHAZINE HCL 25 MG PO TABS
25.0000 mg | ORAL_TABLET | Freq: Four times a day (QID) | ORAL | Status: DC | PRN
  Administered 2014-11-18: 25 mg via ORAL
  Filled 2014-11-17: qty 1

## 2014-11-17 MED ORDER — ACETAMINOPHEN 500 MG PO TABS
500.0000 mg | ORAL_TABLET | Freq: Four times a day (QID) | ORAL | Status: DC | PRN
  Administered 2014-11-18 – 2014-11-20 (×3): 500 mg via ORAL
  Filled 2014-11-17 (×3): qty 1

## 2014-11-17 MED ORDER — SODIUM CHLORIDE 0.9 % IV SOLN
INTRAVENOUS | Status: AC
  Administered 2014-11-17: 3.2 [IU]/h via INTRAVENOUS
  Administered 2014-11-18: 1.5 [IU]/h via INTRAVENOUS
  Administered 2014-11-18: 0.5 [IU]/h via INTRAVENOUS
  Filled 2014-11-17: qty 2.5

## 2014-11-17 NOTE — H&P (Signed)
[facility] Admission History and Physical Service Pager: [phone]  Patient name: Russell [name] Crawshaw Medical record number: [id number] Date of birth: 1970/07/07 Age: 44 y.o. Gender: male  Primary Care Provider: Elvina SidleLAUENSTEIN,KURT, MD Consultants: None Code Status: Full  Chief Complaint: Nausea and vomiting  Assessment and Plan: Russell [name] Bartolomei is a 44 y.o. male presenting with mild DKA. PMH is significant for T2DM, HTN, hypothyroidism, hx of DVT (01/2014), CKD III, GERD.   Mild DKA in T2DM: Pt presenting with nausea and vomiting for the last 2 days. Last HgbA1c (09/21/2014): 10.3%. On admission, CBG 398, bicarb 20, pH (VBG) 7.39, AG 16, K 3.6. Beta-hydroxybutyrate 2.88. UA is significant for ketones. On exam, he is alert and answers questions appropriately. - Admit to stepdown unit, attending Dr. Pollie MeyerMcIntyre. - NPO for now. - CBGs q1hr, BMETs q4hrs - MIVFs: NS at 14425ml/hr. Will switch to D5NS at 15125ml/hr once CBGs are < 250. - Continue insulin drip until anion gap closes x 2. Will then transition to Lantus and allow Pt to eat. - K 3.6 on admission, so will give 10mEq of K every hour per DKA protocol. Will watch K closely overnight. - Continue home anti-nausea medications: Reglan 5mg  qd prn, Phenergan 25mg  q6hrs prn.  - Strict I/O's - Cardiac monitoring   HTN urgency: Pt had elevated BPs up to 220/108 with headaches. He has been vomiting up his blood pressure medications. BP improved to 115/76 after administration of Labetalol 10mg  x 1 in the ED. He has a long history of uncontrolled blood pressure.  - Continue home meds: Lisinopril 40mg  qd and Metoprolol succinate 100mg  qd - If he has high BPs overnight, will give some prn Hydralazine  Lactic Acidosis: most likely related to his DKA. Afebrile with normal respiratory rate and no tachycardia. Normal abdominal exam. Denies having any dysuria. Doubtful for hypovolemia.  May increase as the DKA is being treated.  -  continue to trend  - if febrile will need to obtain cultures and start antibiotics.    Mixed restrictive/obstructive pulmonary defect: [facility] Per chart review, it was thought that a lot of his symptoms were related to airway edema from nephrotic syndrome. The PFTs are consistent with a restrictive pattern with significant bronchodilator response. He uses Albuterol and Combivent at home. On exam, his lungs are clear and he has normal work of breathing. O2 saturations between 94-97% on room air. - Albuterol nebs q6hrs prn, duonebs q4hrs prn - Continuous pulse ox   Nephrotic syndrome and CKD III: Also has a history of horseshoe kidney. Cr on admission was 2.96. His creatinine has been elevated above 3.0 since 09/2014. They were previously less than 1.5. UA significant for >300 protein. Followed by Dr. Annie SableKellie Goldsborough. He had a renal biopsy done recently, but they didn't get a good sample so it will have to be redone. - consider calling [facility] in the morning to let them know he is hospitalized - Monitor with q4hr BMETs for now, then daily BMETs.   Hx DVT: Occurred in 01/2014. Was treated with Xarelto, but was told to stop this by Dr. Kathrene BongoGoldsborough (Nephrologist) recently in the setting of renal failure. - Continue to monitor.   Hypothyroidism: Last TSH (09/22/14) was 5.092. - Continue home med: Levothyroxine 50 mcg daily.   Depression/Anxiety: appears stable  - Continue home meds: Xanax 0.5mg  qhs prn, Celexa 20mg  qd  FEN/GI: MIVFs: NS @ 18725ml/hr, will switch to D5NS @  189ml/hr when CBGs < 250. Prophylaxis: Heparin sq  Disposition: Admit to FPTS in stepdown, attending Dr. Pollie Meyer. Anticipate home in 2-3 days pending clinical improvement.  History of Present Illness:  Russell [name] is a 44 y.o. male presenting with nausea and vomiting. He started feeling bad on Sunday night with a bad headache and high blood pressures. He  continued having headaches and high blood pressures and then he developed nausea and vomiting this morning. He has some of this at baseline, but it comes and goes. They thought he had gastroparesis, but the test came back negative. He took his BP meds and Phenergan last night, but threw them up. He hasn't been able to keep anything down and hasn't eaten anything since Sunday night. He has thrown up more than 15 times today, mostly stomach contents. He hasn't taken any insulin today. He endorses headaches, chills, abdominal pain from throwing up. He denies fevers, chest pain, SOB, hematemesis, dysuria, changes in BMs. No recent travel and no sick contacts.   Of note, he recently had a renal biopsy at [facility] for work-up of nephrotic syndrome, but they didn't get a good sample so needs to have it redone. His nephrologist is Annie Sable.   In the ED, he was found to have a CBG of 398. VBG showed pH 7.39, pCO2 32.2, pO2 31. CMET showed K 3.6, Cr 2.96, AG 16. Beta-hydroxybutyrate was 2.88. UA showed positive ketones. Lactic acid was 2.53. He was given a 1L bolus of NS and he was started on an insulin drip. He was continued on MIVFs at 154ml/hr. His blood sugars remained elevated. His blood pressures were elevated to 220/108. He was treated with Labetalol  IV x 1 and his blood pressures improved to 115/75. He was admitted for further management of his DKA.  Review Of Systems: Per HPI with the following additions: None Otherwise the remainder of the systems were negative.  Patient Active Problem List   Diagnosis Date Noted  . Hypertension 09/30/2014  . Hx of gastroesophageal reflux (GERD) 09/30/2014  . Prolonged Q-T interval on ECG 09/30/2014  . CKD (chronic kidney disease), stage III 09/30/2014  . Acute on chronic renal failure (HCC) 09/24/2014  . Intractable nausea and vomiting 09/21/2014  . Hypertensive urgency 09/21/2014  . Diabetic neuropathy (HCC) 09/21/2014  . Uncontrolled diabetes  mellitus type 2 with peripheral artery disease (HCC) 09/21/2014  . Morbid obesity (HCC) 09/21/2014  . Nausea with vomiting   . DVT (deep venous thrombosis), H/o 01/2014-on Xarelto 03/24/2014  . Hypothyroidism (acquired) 03/24/2014    Past Medical History: Past Medical History  Diagnosis Date  . Thyroid disease   . Diabetes mellitus without complication (HCC)   . Hypertension   . Nephrotic syndrome 05/18/2014  . DVT (deep venous thrombosis), H/o 01/2014-on Xarelto 03/24/2014  . Secondary DM with DKA-AG=16, BIcarb Nl 03/24/2014    Past Surgical History: Past Surgical History  Procedure Laterality Date  . Right ankle fx repair    . Eye surgery    . Esophagogastroduodenoscopy N/A 03/29/2014    Procedure: ESOPHAGOGASTRODUODENOSCOPY (EGD);  Surgeon: Dorena Cookey, MD;  Location: [facility];  Service: Endoscopy;  Laterality: N/A;  . Tonsillectomy and adenoidectomy      Social History: Social History  Substance Use Topics  . Smoking status: Never Smoker   . Smokeless tobacco: Never Used  . Alcohol Use: No   Additional social history: None Please also refer to relevant sections of EMR.   Family History: Family History  Problem  Relation Age of Onset  . Obesity Mother     Patient states that family members have no other medical illnesses other than what I have described  . Heart disease      No family history  . Cancer Father 50    AML    Allergies and Medications: Allergies  Allergen Reactions  . Ibuprofen Other (See Comments)    MD told him not to take due to kidney disease.   No current facility-administered medications on file prior to encounter.   Current Outpatient Prescriptions on File Prior to Encounter  Medication Sig Dispense Refill  . acetaminophen (TYLENOL) 500 MG tablet Take 1 tablet (500 mg total) by mouth every 6 (six) hours as needed for moderate pain. 30 tablet 0  . albuterol (PROVENTIL HFA;VENTOLIN HFA) 108 (90 BASE) MCG/ACT inhaler Inhale 1 puff into the  lungs every 6 (six) hours as needed for wheezing or shortness of breath.    Marland Kitchen albuterol (PROVENTIL) (2.5 MG/3ML) 0.083% nebulizer solution Take 3 mLs (2.5 mg total) by nebulization every 6 (six) hours as needed for wheezing or shortness of breath. 75 mL 12  . albuterol-ipratropium (COMBIVENT) 18-103 MCG/ACT inhaler Inhale 2 puffs into the lungs every 4 (four) hours. (Patient taking differently: Inhale 2 puffs into the lungs every 4 (four) hours. Take as needed) 1 Inhaler 11  . ALPRAZolam (XANAX) 0.5 MG tablet TAKE 1 TABLET TWICE DAILY AS NEEDED FOR SLEEP 60 tablet 0  . citalopram (CELEXA) 20 MG tablet Take 1 tablet (20 mg total) by mouth daily. (Patient taking differently: Take 40 mg by mouth daily. ) 90 tablet 1  . cyclobenzaprine (FLEXERIL) 10 MG tablet Take 1 tablet (10 mg total) by mouth at bedtime. 30 tablet 6  . gabapentin (NEURONTIN) 100 MG capsule Take 1 capsule (100 mg total) by mouth at bedtime. 30 capsule 0  . HYDROmorphone (DILAUDID) 2 MG tablet Take 1 tablet (2 mg total) by mouth every 6 (six) hours as needed for severe pain. 30 tablet 0  . Insulin Lispro, Human, (HUMALOG KWIKPEN) 200 UNIT/ML SOPN Inject 33 Units into the skin 3 (three) times daily before meals. (Patient taking differently: Inject 15-80 Units into the skin 3 (three) times daily before meals. Per sliding scale) 90 mL 1  . LANTUS SOLOSTAR 100 UNIT/ML Solostar Pen Inject 30 Units into the skin 2 (two) times daily. 15 mL 3  . levothyroxine (SYNTHROID, LEVOTHROID) 50 MCG tablet Take 1 tablet (50 mcg total) by mouth daily. 90 tablet 3  . lisinopril (PRINIVIL,ZESTRIL) 40 MG tablet Take 40 mg by mouth daily.     . metoCLOPramide (REGLAN) 5 MG tablet Take 1 tablet (5 mg total) by mouth 4 (four) times daily. (Patient taking differently: Take 5 mg by mouth daily as needed. ) 30 tablet 0  . metoprolol succinate (TOPROL-XL) 100 MG 24 hr tablet Take 1 tablet (100 mg total) by mouth daily. 90 tablet 3  . promethazine (PHENERGAN) 25 MG  tablet Take 1 tablet (25 mg total) by mouth every 6 (six) hours as needed for nausea. 20 tablet 0    Objective: BP 213/112 mmHg  Pulse 81  Temp(Src) 98 F (36.7 C) (Oral)  Resp 25  SpO2 97% Exam: General: Tired-appearing male, laying on his side in bed, awake and answers questions. HEENT: [place]/AT, EOMI, no scleral icterus, dry mucous membranes. Neck: Supple Cardiovascular: RRR, no murmurs, 2+ DP pulses bilaterally Respiratory: CTAB, no wheezes Abdomen: +BS, soft, non-tender, non-distended MSK: 1+ pitting edema to the  mid-shin bilaterally, moves all 4 extremities spontaneously Skin: Warm and dry, no rashes or lesions. Neuro: Awake, alert, oriented; CN 2-12 grossly intact Psych: Appropriate mood and behavior  Labs and Imaging: CBC BMET   Recent Labs Lab 11/17/14 1811 11/17/14 1840  WBC 11.8*  --   HGB 15.7 16.0  HCT 43.0 47.0  PLT 336  --     Recent Labs Lab 11/17/14 1811 11/17/14 1840  NA 137 137  K 3.6 3.7  CL 101 103  CO2 20*  --   BUN 33* 34*  CREATININE 2.96* 2.60*  GLUCOSE 434* 453*  CALCIUM 9.1  --      VBG: pH 7.389, pCO2 32.2, pO2 31, bicarb 19.5 i-stat troponin: 0.00 UA: few bacteria, >1000 glucose, large Hgb, 15 ketones, negative leukocytes, negative nitrites, >300 protein, 0-5 WBC Beta-hydroxybutyrate: 2.88  Campbell Stall, MD 11/17/2014, 9:39 PM PGY-1, [facility] FPTS Intern pager: [phone], text pages welcome  Upper Level Addendum:  I have seen and evaluated this patient along with Dr. Collene Schlichter and reviewed the above note, making necessary revisions in [facility].   Clare Gandy, MD Family Medicine PGY-3

## 2014-11-17 NOTE — ED Notes (Signed)
Spoke with Dr Celene KrasBrooten, received orders to dc 2nd bolus and decrease labetolol to 10mg .

## 2014-11-17 NOTE — ED Notes (Signed)
Pt sts N/V and abd pain x 2 days; pt sts hx of similar in past

## 2014-11-17 NOTE — ED Provider Notes (Signed)
CSN: [id number]     Arrival date & time 11/17/14  1736 History   First MD Initiated Contact with Patient 11/17/14 1809     Chief Complaint  Patient presents with  . Emesis     (Consider location/radiation/quality/duration/timing/severity/associated sxs/prior Treatment) Patient is a 44 y.o. male presenting with vomiting. The history is provided by the patient, medical records, the spouse, the nursing home and a relative. The history is limited by the condition of the patient.  Emesis Severity:  Severe Duration:  1 day Timing:  Constant Quality:  Stomach contents Progression:  Unchanged Chronicity:  Recurrent Recent urination:  Decreased Relieved by:  Nothing Worsened by:  Nothing tried Ineffective treatments:  None tried Associated symptoms: abdominal pain   Associated symptoms: no headaches   Risk factors: no alcohol use     Past Medical History  Diagnosis Date  . Thyroid disease   . Diabetes mellitus without complication (HCC)   . Hypertension   . Nephrotic syndrome 05/18/2014  . DVT (deep venous thrombosis), H/o 01/2014-on Xarelto 03/24/2014  . Secondary DM with DKA-AG=16, BIcarb Nl 03/24/2014   Past Surgical History  Procedure Laterality Date  . Right ankle fx repair    . Eye surgery    . Esophagogastroduodenoscopy N/A 03/29/2014    Procedure: ESOPHAGOGASTRODUODENOSCOPY (EGD);  Surgeon: Dorena CookeyJohn Hayes, MD;  Location: [facility];  Service: Endoscopy;  Laterality: N/A;  . Tonsillectomy and adenoidectomy     Family History  Problem Relation Age of Onset  . Obesity Mother     Patient states that family members have no other medical illnesses other than what I have described  . Heart disease      No family history  . Cancer Father [id number]    AML   Social History  Substance Use Topics  . Smoking status: Never Smoker   . Smokeless tobacco: Never Used  . Alcohol Use: No    Review of Systems  Constitutional: Negative for fever.  HENT: Negative for facial swelling.    Respiratory: Negative for shortness of breath.   Cardiovascular: Negative for chest pain and leg swelling.  Gastrointestinal: Positive for vomiting, abdominal pain and abdominal distention.  Genitourinary: Negative for dysuria.  Musculoskeletal: Negative for back pain.  Skin: Negative for rash.  Neurological: Negative for headaches.  Psychiatric/Behavioral: Negative for confusion.      Allergies  Ibuprofen  Home Medications   Prior to Admission medications   Medication Sig Start Date End Date Taking? Authorizing Provider  acetaminophen (TYLENOL) 500 MG tablet Take 1 tablet (500 mg total) by mouth every 6 (six) hours as needed for moderate pain. 09/29/14  Yes Albertine GratesFang Xu, MD  albuterol (PROVENTIL HFA;VENTOLIN HFA) 108 (90 BASE) MCG/ACT inhaler Inhale 1 puff into the lungs every 6 (six) hours as needed for wheezing or shortness of breath.   Yes Historical Provider, MD  albuterol (PROVENTIL) (2.5 MG/3ML) 0.083% nebulizer solution Take 3 mLs (2.5 mg total) by nebulization every 6 (six) hours as needed for wheezing or shortness of breath. 09/03/14  Yes [name], MD  albuterol-ipratropium (COMBIVENT) 18-103 MCG/ACT inhaler Inhale 2 puffs into the lungs every 4 (four) hours. Patient taking differently: Inhale 2 puffs into the lungs every 4 (four) hours. Take as needed 09/03/14  Yes [name], MD  ALPRAZolam Prudy Feeler(XANAX) 0.5 MG tablet TAKE 1 TABLET TWICE DAILY AS NEEDED FOR SLEEP 10/12/14  Yes [name], MD  citalopram (CELEXA) 20 MG tablet Take 1 tablet (20 mg total) by mouth daily. Patient taking differently:  Take 40 mg by mouth daily.  10/12/14  Yes [name], MD  cyclobenzaprine (FLEXERIL) 10 MG tablet Take 1 tablet (10 mg total) by mouth at bedtime. 10/12/14  Yes [name], MD  gabapentin (NEURONTIN) 100 MG capsule Take 1 capsule (100 mg total) by mouth at bedtime. 09/29/14  Yes Albertine Grates, MD  HYDROmorphone (DILAUDID) 2 MG tablet Take 1 tablet (2 mg total) by mouth every 6  (six) hours as needed for severe pain. 10/12/14  Yes [name], MD  Insulin Lispro, Human, (HUMALOG KWIKPEN) 200 UNIT/ML SOPN Inject 33 Units into the skin 3 (three) times daily before meals. Patient taking differently: Inject 15-80 Units into the skin 3 (three) times daily before meals. Per sliding scale 07/31/14  Yes Carlus Pavlov, MD  LANTUS SOLOSTAR 100 UNIT/ML Solostar Pen Inject 30 Units into the skin 2 (two) times daily. 11/04/14  Yes Carlus Pavlov, MD  levothyroxine (SYNTHROID, LEVOTHROID) 50 MCG tablet Take 1 tablet (50 mcg total) by mouth daily. 05/02/13  Yes [name], MD  lisinopril (PRINIVIL,ZESTRIL) 40 MG tablet Take 40 mg by mouth daily.  09/25/14  Yes Historical Provider, MD  metoCLOPramide (REGLAN) 5 MG tablet Take 1 tablet (5 mg total) by mouth 4 (four) times daily. Patient taking differently: Take 5 mg by mouth daily as needed.  10/12/14  Yes [name], MD  metoprolol succinate (TOPROL-XL) 100 MG 24 hr tablet Take 1 tablet (100 mg total) by mouth daily. 05/20/14  Yes Lewayne Bunting, MD  promethazine (PHENERGAN) 25 MG tablet Take 1 tablet (25 mg total) by mouth every 6 (six) hours as needed for nausea. 09/03/14  Yes [name], MD   BP 216/132 mmHg  Pulse 88  Temp(Src) 97.2 F (36.2 C) (Oral)  Resp 18  SpO2 98% Physical Exam  Constitutional: He is oriented to person, place, and time. He appears well-developed and well-nourished. No distress.  HENT:  Head: Normocephalic and atraumatic.  Right Ear: External ear normal.  Left Ear: External ear normal.  Nose: Nose normal.  Mouth/Throat: Oropharynx is clear and moist. No oropharyngeal exudate.  Eyes: Conjunctivae and EOM are normal. Pupils are equal, round, and reactive to light. Right eye exhibits no discharge. Left eye exhibits no discharge. No scleral icterus.  Neck: Normal range of motion. Neck supple. No JVD present. No tracheal deviation present. No thyromegaly present.  Cardiovascular: Normal  rate, regular rhythm and intact distal pulses.   Pulmonary/Chest: No stridor. He is in respiratory distress. He has no wheezes. He has no rales.  Abdominal: Soft. He exhibits no distension. There is tenderness.  Musculoskeletal: Normal range of motion. He exhibits no edema or tenderness.  Lymphadenopathy:    He has no cervical adenopathy.  Neurological: He is alert and oriented to person, place, and time.  Skin: Skin is warm and dry. No rash noted. He is not diaphoretic. No erythema. No pallor.  Psychiatric: He has a normal mood and affect. His behavior is normal. Judgment and thought content normal.  Nursing note and vitals reviewed.   ED Course  Procedures (including critical care time) Labs Review Labs Reviewed  COMPREHENSIVE METABOLIC PANEL - Abnormal; Notable for the following:    CO2 20 (*)    Glucose, Bld 434 (*)    BUN 33 (*)    Creatinine, Ser 2.96 (*)    Total Protein 5.7 (*)    Albumin 2.3 (*)    GFR calc non Af Amer 24 (*)    GFR calc Af Amer 28 (*)  Anion gap 16 (*)    All other components within normal limits  CBC - Abnormal; Notable for the following:    WBC 11.8 (*)    MCHC 36.5 (*)    All other components within normal limits  URINALYSIS, ROUTINE W REFLEX MICROSCOPIC (NOT AT [facility]) - Abnormal; Notable for the following:    Glucose, UA >1000 (*)    Hgb urine dipstick LARGE (*)    Ketones, ur 15 (*)    Protein, ur >300 (*)    All other components within normal limits  BETA-HYDROXYBUTYRIC ACID - Abnormal; Notable for the following:    Beta-Hydroxybutyric Acid 2.88 (*)    All other components within normal limits  URINE MICROSCOPIC-ADD ON - Abnormal; Notable for the following:    Squamous Epithelial / LPF 0-5 (*)    Bacteria, UA FEW (*)    Casts GRANULAR CAST (*)    All other components within normal limits  BASIC METABOLIC PANEL - Abnormal; Notable for the following:    CO2 20 (*)    Glucose, Bld 436 (*)    BUN 34 (*)    Creatinine, Ser 3.19 (*)     Calcium 8.1 (*)    GFR calc non Af Amer 22 (*)    GFR calc Af Amer 26 (*)    All other components within normal limits  BASIC METABOLIC PANEL - Abnormal; Notable for the following:    Glucose, Bld 329 (*)    BUN 39 (*)    Creatinine, Ser 3.37 (*)    Calcium 7.9 (*)    GFR calc non Af Amer 21 (*)    GFR calc Af Amer 24 (*)    All other components within normal limits  BASIC METABOLIC PANEL - Abnormal; Notable for the following:    Glucose, Bld 142 (*)    BUN 40 (*)    Creatinine, Ser 3.34 (*)    Calcium 7.9 (*)    GFR calc non Af Amer 21 (*)    GFR calc Af Amer 24 (*)    All other components within normal limits  PHOSPHORUS - Abnormal; Notable for the following:    Phosphorus 5.9 (*)    All other components within normal limits  TROPONIN I - Abnormal; Notable for the following:    Troponin I 0.04 (*)    All other components within normal limits  TROPONIN I - Abnormal; Notable for the following:    Troponin I 0.04 (*)    All other components within normal limits  LACTIC ACID, PLASMA - Abnormal; Notable for the following:    Lactic Acid, Venous 3.6 (*)    All other components within normal limits  BASIC METABOLIC PANEL - Abnormal; Notable for the following:    Glucose, Bld 176 (*)    BUN 39 (*)    Creatinine, Ser 3.32 (*)    Calcium 7.9 (*)    GFR calc non Af Amer 21 (*)    GFR calc Af Amer 25 (*)    All other components within normal limits  GLUCOSE, CAPILLARY - Abnormal; Notable for the following:    Glucose-Capillary 381 (*)    All other components within normal limits  BASIC METABOLIC PANEL - Abnormal; Notable for the following:    Potassium 3.4 (*)    Glucose, Bld 240 (*)    BUN 39 (*)    Creatinine, Ser 3.28 (*)    Calcium 8.6 (*)    GFR calc non Af Amer 22 (*)  GFR calc Af Amer 25 (*)    All other components within normal limits  GLUCOSE, CAPILLARY - Abnormal; Notable for the following:    Glucose-Capillary 377 (*)    All other components within normal  limits  GLUCOSE, CAPILLARY - Abnormal; Notable for the following:    Glucose-Capillary 380 (*)    All other components within normal limits  GLUCOSE, CAPILLARY - Abnormal; Notable for the following:    Glucose-Capillary 332 (*)    All other components within normal limits  GLUCOSE, CAPILLARY - Abnormal; Notable for the following:    Glucose-Capillary 249 (*)    All other components within normal limits  LACTIC ACID, PLASMA - Abnormal; Notable for the following:    Lactic Acid, Venous 2.3 (*)    All other components within normal limits  GLUCOSE, CAPILLARY - Abnormal; Notable for the following:    Glucose-Capillary 186 (*)    All other components within normal limits  GLUCOSE, CAPILLARY - Abnormal; Notable for the following:    Glucose-Capillary 115 (*)    All other components within normal limits  GLUCOSE, CAPILLARY - Abnormal; Notable for the following:    Glucose-Capillary 124 (*)    All other components within normal limits  GLUCOSE, CAPILLARY - Abnormal; Notable for the following:    Glucose-Capillary 124 (*)    All other components within normal limits  GLUCOSE, CAPILLARY - Abnormal; Notable for the following:    Glucose-Capillary 146 (*)    All other components within normal limits  GLUCOSE, CAPILLARY - Abnormal; Notable for the following:    Glucose-Capillary 210 (*)    All other components within normal limits  GLUCOSE, CAPILLARY - Abnormal; Notable for the following:    Glucose-Capillary 170 (*)    All other components within normal limits  GLUCOSE, CAPILLARY - Abnormal; Notable for the following:    Glucose-Capillary 173 (*)    All other components within normal limits  TROPONIN I - Abnormal; Notable for the following:    Troponin I 0.04 (*)    All other components within normal limits  GLUCOSE, CAPILLARY - Abnormal; Notable for the following:    Glucose-Capillary 205 (*)    All other components within normal limits  GLUCOSE, CAPILLARY - Abnormal; Notable for the  following:    Glucose-Capillary 208 (*)    All other components within normal limits  GLUCOSE, CAPILLARY - Abnormal; Notable for the following:    Glucose-Capillary 240 (*)    All other components within normal limits  CBG MONITORING, ED - Abnormal; Notable for the following:    Glucose-Capillary 398 (*)    All other components within normal limits  I-STAT CHEM 8, ED - Abnormal; Notable for the following:    BUN 34 (*)    Creatinine, Ser 2.60 (*)    Glucose, Bld 453 (*)    All other components within normal limits  I-STAT VENOUS BLOOD GAS, ED - Abnormal; Notable for the following:    pH, Ven 7.389 (*)    pCO2, Ven 32.2 (*)    Bicarbonate 19.5 (*)    Acid-base deficit 4.0 (*)    All other components within normal limits  I-STAT CG4 LACTIC ACID, ED - Abnormal; Notable for the following:    Lactic Acid, Venous 2.53 (*)    All other components within normal limits  MRSA PCR SCREENING  LIPASE, BLOOD  MAGNESIUM  TROPONIN I  LACTIC ACID, PLASMA  LACTIC ACID, PLASMA  I-STAT TROPOININ, ED  I-STAT CG4 LACTIC ACID,  ED    Imaging Review No results found. I have personally reviewed and evaluated these images and lab results as part of my medical decision-making.   EKG Interpretation   Date/Time:  Tuesday November 17 2014 18:27:54 EST Ventricular Rate:  84 PR Interval:  160 QRS Duration: 85 QT Interval:  420 QTC Calculation: 496 R Axis:   32 Text Interpretation:  Sinus rhythm Probable left atrial enlargement  Anterior infarct, old ED PHYSICIAN INTERPRETATION AVAILABLE IN [facility] Confirmed by TEST, Record ([id number]) on 11/18/2014 7:07:37 AM      MDM   Final diagnoses:  Diabetic ketoacidosis without coma associated with type 2 diabetes mellitus (HCC)    Pt has hx of DKA in the past and his symptoms today are like a prior episode of it.  Pt has a hx of possible gastroparesis.  Advised to keep, NPO for initial evaluation.  Pt has f/u arranged.  Pt having worse CP and  DOE than baseline.  Pt now with RLL PNA and cough.    No other  acute pathology   Pt was seen by medicine and will be admitted for further managmeenemm  Gavin Pound, MD 11/19/14 0241  Melene Plan, DO 11/19/14 6213

## 2014-11-18 ENCOUNTER — Encounter (HOSPITAL_COMMUNITY): Payer: Self-pay | Admitting: Family Medicine

## 2014-11-18 DIAGNOSIS — E1110 Type 2 diabetes mellitus with ketoacidosis without coma: Secondary | ICD-10-CM | POA: Insufficient documentation

## 2014-11-18 DIAGNOSIS — E038 Other specified hypothyroidism: Secondary | ICD-10-CM

## 2014-11-18 LAB — BASIC METABOLIC PANEL
Anion gap: 10 (ref 5–15)
Anion gap: 12 (ref 5–15)
Anion gap: 7 (ref 5–15)
Anion gap: 8 (ref 5–15)
Anion gap: 9 (ref 5–15)
BUN: 34 mg/dL — AB (ref 6–20)
BUN: 39 mg/dL — AB (ref 6–20)
BUN: 39 mg/dL — AB (ref 6–20)
BUN: 39 mg/dL — AB (ref 6–20)
BUN: 40 mg/dL — AB (ref 6–20)
CALCIUM: 7.9 mg/dL — AB (ref 8.9–10.3)
CALCIUM: 7.9 mg/dL — AB (ref 8.9–10.3)
CALCIUM: 8.1 mg/dL — AB (ref 8.9–10.3)
CHLORIDE: 105 mmol/L (ref 101–111)
CHLORIDE: 106 mmol/L (ref 101–111)
CHLORIDE: 107 mmol/L (ref 101–111)
CHLORIDE: 108 mmol/L (ref 101–111)
CHLORIDE: 108 mmol/L (ref 101–111)
CO2: 20 mmol/L — AB (ref 22–32)
CO2: 22 mmol/L (ref 22–32)
CO2: 22 mmol/L (ref 22–32)
CO2: 22 mmol/L (ref 22–32)
CO2: 23 mmol/L (ref 22–32)
CREATININE: 3.19 mg/dL — AB (ref 0.61–1.24)
CREATININE: 3.28 mg/dL — AB (ref 0.61–1.24)
CREATININE: 3.32 mg/dL — AB (ref 0.61–1.24)
CREATININE: 3.34 mg/dL — AB (ref 0.61–1.24)
CREATININE: 3.37 mg/dL — AB (ref 0.61–1.24)
Calcium: 7.9 mg/dL — ABNORMAL LOW (ref 8.9–10.3)
Calcium: 8.6 mg/dL — ABNORMAL LOW (ref 8.9–10.3)
GFR calc Af Amer: 24 mL/min — ABNORMAL LOW (ref >60)
GFR calc Af Amer: 24 mL/min — ABNORMAL LOW (ref >60)
GFR calc Af Amer: 25 mL/min — ABNORMAL LOW (ref >60)
GFR calc Af Amer: 25 mL/min — ABNORMAL LOW (ref >60)
GFR calc non Af Amer: 21 mL/min — ABNORMAL LOW (ref >60)
GFR calc non Af Amer: 21 mL/min — ABNORMAL LOW (ref >60)
GFR calc non Af Amer: 21 mL/min — ABNORMAL LOW (ref >60)
GFR calc non Af Amer: 22 mL/min — ABNORMAL LOW (ref >60)
GFR calc non Af Amer: 22 mL/min — ABNORMAL LOW (ref >60)
GFR, EST AFRICAN AMERICAN: 26 mL/min — AB (ref >60)
GLUCOSE: 142 mg/dL — AB (ref 65–99)
GLUCOSE: 176 mg/dL — AB (ref 65–99)
GLUCOSE: 240 mg/dL — AB (ref 65–99)
Glucose, Bld: 329 mg/dL — ABNORMAL HIGH (ref 65–99)
Glucose, Bld: 436 mg/dL — ABNORMAL HIGH (ref 65–99)
Potassium: 3.4 mmol/L — ABNORMAL LOW (ref 3.5–5.1)
Potassium: 3.6 mmol/L (ref 3.5–5.1)
Potassium: 3.8 mmol/L (ref 3.5–5.1)
Potassium: 3.9 mmol/L (ref 3.5–5.1)
Potassium: 4.1 mmol/L (ref 3.5–5.1)
SODIUM: 137 mmol/L (ref 135–145)
SODIUM: 137 mmol/L (ref 135–145)
SODIUM: 138 mmol/L (ref 135–145)
Sodium: 139 mmol/L (ref 135–145)
Sodium: 138 mmol/L (ref 135–145)

## 2014-11-18 LAB — GLUCOSE, CAPILLARY
GLUCOSE-CAPILLARY: 249 mg/dL — AB (ref 65–99)
GLUCOSE-CAPILLARY: 186 mg/dL — AB (ref 65–99)
GLUCOSE-CAPILLARY: 115 mg/dL — AB (ref 65–99)
GLUCOSE-CAPILLARY: 124 mg/dL — AB (ref 65–99)
GLUCOSE-CAPILLARY: 240 mg/dL — AB (ref 65–99)
GLUCOSE-CAPILLARY: 205 mg/dL — AB (ref 65–99)
GLUCOSE-CAPILLARY: 208 mg/dL — AB (ref 65–99)
Glucose-Capillary: 124 mg/dL — ABNORMAL HIGH (ref 65–99)
Glucose-Capillary: 146 mg/dL — ABNORMAL HIGH (ref 65–99)
Glucose-Capillary: 170 mg/dL — ABNORMAL HIGH (ref 65–99)
Glucose-Capillary: 173 mg/dL — ABNORMAL HIGH (ref 65–99)
Glucose-Capillary: 210 mg/dL — ABNORMAL HIGH (ref 65–99)
Glucose-Capillary: 332 mg/dL — ABNORMAL HIGH (ref 65–99)
Glucose-Capillary: 377 mg/dL — ABNORMAL HIGH (ref 65–99)
Glucose-Capillary: 380 mg/dL — ABNORMAL HIGH (ref 65–99)

## 2014-11-18 LAB — LACTIC ACID, PLASMA
LACTIC ACID, VENOUS: 2.3 mmol/L — AB (ref 0.5–2.0)
LACTIC ACID, VENOUS: 1.6 mmol/L (ref 0.5–2.0)
Lactic Acid, Venous: 3.6 mmol/L (ref 0.5–2.0)
Lactic Acid, Venous: 1.8 mmol/L (ref 0.5–2.0)

## 2014-11-18 LAB — TROPONIN I
TROPONIN I: 0.03 ng/mL (ref <0.031)
TROPONIN I: 0.04 ng/mL — AB (ref <0.031)
TROPONIN I: 0.04 ng/mL — AB (ref <0.031)
Troponin I: 0.04 ng/mL — ABNORMAL HIGH (ref <0.031)

## 2014-11-18 LAB — MRSA PCR SCREENING: MRSA by PCR: NEGATIVE

## 2014-11-18 LAB — PHOSPHORUS: Phosphorus: 5.9 mg/dL — ABNORMAL HIGH (ref 2.5–4.6)

## 2014-11-18 MED ORDER — INSULIN GLARGINE 100 UNIT/ML ~~LOC~~ SOLN
20.0000 [IU] | Freq: Every day | SUBCUTANEOUS | Status: DC
Start: 2014-11-18 — End: 2014-11-21
  Administered 2014-11-18 – 2014-11-20 (×3): 20 [IU] via SUBCUTANEOUS
  Filled 2014-11-18 (×4): qty 0.2

## 2014-11-18 MED ORDER — GI COCKTAIL ~~LOC~~: 30.0000 mL | Freq: Two times a day (BID) | ORAL | Status: DC | PRN

## 2014-11-18 MED ORDER — METOCLOPRAMIDE HCL 5 MG/ML IJ SOLN
10.0000 mg | Freq: Once | INTRAMUSCULAR | Status: AC
  Administered 2014-11-18: 10 mg via INTRAVENOUS
  Filled 2014-11-18: qty 2

## 2014-11-18 MED ORDER — PROMETHAZINE HCL 25 MG/ML IJ SOLN
25.0000 mg | Freq: Four times a day (QID) | INTRAMUSCULAR | Status: DC | PRN
  Administered 2014-11-18 – 2014-11-19 (×3): 25 mg via INTRAVENOUS
  Filled 2014-11-18 (×3): qty 1

## 2014-11-18 MED ORDER — HYDRALAZINE HCL 20 MG/ML IJ SOLN
5.0000 mg | Freq: Four times a day (QID) | INTRAMUSCULAR | Status: DC | PRN
  Administered 2014-11-19 – 2014-11-20 (×4): 5 mg via INTRAVENOUS
  Filled 2014-11-18 (×6): qty 1

## 2014-11-18 MED ORDER — SODIUM CHLORIDE 0.9 % IV SOLN
INTRAVENOUS | Status: DC
  Administered 2014-11-18: 18:00:00 via INTRAVENOUS
  Administered 2014-11-19: 1000 mL via INTRAVENOUS
  Administered 2014-11-19 – 2014-11-20 (×2): via INTRAVENOUS

## 2014-11-18 MED ORDER — HYDRALAZINE HCL 20 MG/ML IJ SOLN
10.0000 mg | Freq: Once | INTRAMUSCULAR | Status: AC
  Administered 2014-11-18: 10 mg via INTRAVENOUS
  Filled 2014-11-18: qty 1

## 2014-11-18 MED ORDER — METOCLOPRAMIDE HCL 10 MG PO TABS
5.0000 mg | ORAL_TABLET | Freq: Four times a day (QID) | ORAL | Status: DC | PRN
  Filled 2014-11-18: qty 1

## 2014-11-18 MED ORDER — HYDRALAZINE HCL 20 MG/ML IJ SOLN: 5.0000 mg | Freq: Four times a day (QID) | INTRAMUSCULAR | Status: DC | PRN

## 2014-11-18 MED ORDER — IPRATROPIUM-ALBUTEROL 0.5-2.5 (3) MG/3ML IN SOLN: 3.0000 mL | RESPIRATORY_TRACT | Status: DC | PRN

## 2014-11-18 MED ORDER — INSULIN ASPART 100 UNIT/ML ~~LOC~~ SOLN
0.0000 [IU] | Freq: Four times a day (QID) | SUBCUTANEOUS | Status: DC
  Administered 2014-11-18: 3 [IU] via SUBCUTANEOUS
  Administered 2014-11-18: 2 [IU] via SUBCUTANEOUS
  Administered 2014-11-18: 3 [IU] via SUBCUTANEOUS
  Administered 2014-11-19: 2 [IU] via SUBCUTANEOUS
  Administered 2014-11-19 (×2): 3 [IU] via SUBCUTANEOUS
  Administered 2014-11-20 (×3): 1 [IU] via SUBCUTANEOUS

## 2014-11-18 NOTE — Progress Notes (Addendum)
Inpatient Diabetes Program Recommendations  AACE/ADA: New Consensus Statement on Inpatient Glycemic Control (2015)  Target Ranges:  Prepandial:   less than 140 mg/dL      Peak postprandial:   less than 180 mg/dL (1-2 hours)      Critically ill patients:  140 - 180 mg/dL   Review of Glycemic Control  Diabetes history: DM 2 Outpatient Diabetes medications: Lantus 30 units BID, Humalog 15-80 units TID Current orders for Inpatient glycemic control: Insulin gtt transitioning to Lantus 20 units Daily  Inpatient Diabetes Program Recommendations: Correction (SSI): Please consider while inpatient to check CBGs and order Novolog Sensitive scale Q4hrs while NPO when insulin gtt is turned off. If patient is able to eat adjust time to ACHS and order meal coverage.  Note: Patient takes much more basal insulin at home. May have to change frequency to BID.  Thanks,  Christena DeemShannon Delwyn Scoggin RN, MSN, [facility] Inpatient Diabetes Coordinator Team Pager [phone] (8a-5p)

## 2014-11-18 NOTE — Progress Notes (Signed)
FPTS Interim Progress Note  S: Called by nursing due to patient's persistent nausea vomiting and headache. Patient exhibited dry heaving and discomfort while I examined him. Patient reported that he felt fine until about noon today. Since that time he is had persistent nausea vomiting. No complaints of dizziness change in vision chest pain or shortness of breath.  O: BP 182/101 mmHg  Pulse 77  Temp(Src) 98 F (36.7 C) (Oral)  Resp 19  Ht 6\' 2"  (1.88 m)  Wt 252 lb 12.8 oz (114.669 kg)  BMI 32.44 kg/m2  SpO2 97%  Gen: Lying in bed appearing uncomfortable. No acute distress. Occasional dry heave. Diaphoretic. HEENT: EOMI, no nystagmus appreciated. Neck with full range of motion. Lung: Clear, no increased work of breathing. Cardio: Normal rate  A/P: - Reglan 10 mg IV now; 5 mg every 6 hours when necessary by mouth - EKG now - Troponin 1 now - IV hydralazine 10 mg now; 5 mg every 6 hours when necessary for systolic pressures > 180, and diastolic pressures > 110   Kathee DeltonIan D Loreta Blouch, MD 11/18/2014, 8:53 PM PGY-2, [facility] pager [phone]

## 2014-11-18 NOTE — Progress Notes (Signed)
CRITICAL VALUE ALERT  Critical value received:  Lactic acid, Venous (2.3)  Date of notification:  11/16/20216  Time of notification:  0826  Critical value read back:Yes.    Nurse who received alert:  Lovie MacadamiaNadine Lavonne Kinderman RN  MD notified (1st page):  Family Medicine  Time of first page:  [phone]  MD notified (2nd page):  Time of second page:  Responding MD:  Dr Kathie RhodesGunadosa  Time MD responded:  [phone]

## 2014-11-18 NOTE — Progress Notes (Signed)
Utilization Review Completed.  

## 2014-11-18 NOTE — Progress Notes (Signed)
11/18/2014 Patient continue to be nauseated Family medicine was made aware at 1300 and this evening at 1730. Patient Phenergan was change from po to iv and this evening patient receive on time dose iv Reglan. Patient state Reglan helped a little, but still feel nauseated. Patient blood pressure today this evening was 205/115 Dr Wende MottMckeag was made aware both the nausea and the high blood pressure. EKG, troponin x1 and hydralazine for blood pressure was ordered. [facility] Madolyn Ackroyd RN.

## 2014-11-18 NOTE — Progress Notes (Signed)
Family Medicine Teaching Service Daily Progress Note Intern Pager: [phone]  Patient name: Russell [name] Medical record number: [id number] Date of birth: 02/26/1970 Age: 44 y.o. Gender: male  Primary Care Provider: Elvina SidleLAUENSTEIN,KURT, MD Consultants: None Code Status: Full   Pt Overview and Major Events to Date:  11/15: Admitted to FPTS with DKA  Assessment and Plan: Russell [name] is a 44 y.o. male presenting with mild DKA. PMH is significant for T2DM, HTN, hypothyroidism, hx of DVT (01/2014), CKD III, GERD.  Mild DKA in T2DM: Pt presenting with nausea and vomiting for the last 2 days. Last HgbA1c (09/21/2014): 10.3%. On admission, CBG 398, bicarb 20, pH (VBG) 7.39, AG 16, K 3.6. Beta-hydroxybutyrate 2.88. UA is significant for ketones. On exam, he is alert and answers questions appropriately. UOP is 1.49L since admission. - NPO for now. Will transition to clears today and advance diet from there. - CBGs q1hr: 398 > 381 > 377 > 380 > 332 > 249 > 186 > 115 > 124 - BMETs q4hrs: AG 16 > 12 > 9 > 8; K 3.7 > 3.9 > 4.1 > 3.8 - Pt has gotten KCl 10 mEq x 2 - MIVFs: D5NS at 17925ml/hr, now that CBGs are <250. - Anion gap is closed x 2. Will give Lantus 20 units and then stop the insulin drip 2 hours later. - Continue home anti-nausea medications: Reglan 5mg  qd prn, Phenergan 25mg  q6hrs prn.  - Strict I/O's - Cardiac monitoring  HTN urgency: He has a long history of uncontrolled blood pressure. Pt had elevated BPs up to 220/108 with headaches on admission. He was vomiting up is BP meds prior to admission. Given Labetalol 10mg  x 1 in the ED. BPs improved 110-126/65-77 and have since increased again. 175/102 this morning. - Continue home meds: Lisinopril 40mg  qd and Metoprolol succinate 100mg  qd - He hasn't gotten his BP meds yet this morning, so if he continues to have high BPs later today, will give Hydralazine.  Lactic Acidosis: Most likely related to his DKA. Afebrile with normal respiratory  rate and no tachycardia. Normal abdominal exam. Denies having any dysuria. Doubtful for hypovolemia.Lactic acid may bump slightly as the DKA is being treated. Reassured that it is trending down this morning. - Continue to trend: 2.53 > 3.6 > 2.3 - If febrile will need to obtain cultures and start antibiotics.   Mixed restrictive/obstructive pulmonary defect: [facility] Per chart review, it was thought that a lot of his symptoms were related to airway edema from nephrotic syndrome. The PFTs are consistent with a restrictive pattern with significant bronchodilator response. He uses Albuterol and Combivent at home. On exam, his lungs are clear and he has normal work of breathing. O2 saturations between 94-97% on room air. - Albuterol nebs q6hrs prn, duonebs q4hrs prn - Continuous pulse ox  Nephrotic syndrome and CKD III: Also has a history of horseshoe kidney. Cr on admission was 2.96 > 3.34 this am. His creatinine has been elevated above 3.0 since 09/2014. They were previously less than 1.5. UA significant for >300 protein. Followed by Dr. Annie SableKellie Goldsborough. He had a renal biopsy done recently, but they didn't get a good sample so it will have to be redone. - Will call [facility] this morning to let them know he is hospitalized - Monitor with q4hr BMETs for now, then daily BMETs.  Hx DVT: Occurred in 01/2014. Was treated with Xarelto, but was told to stop  this by Dr. [name] (Nephrologist) recently in the setting of renal failure. - Continue to monitor.  Hypothyroidism: Last TSH (09/22/14) was 5.092. - Continue home med: Levothyroxine 50 mcg daily.  Depression/Anxiety: appears stable  - Continue home meds: Xanax 0.5mg  qhs prn, Celexa  qd  FEN/GI: MIVFs: NS @ 145ml/hr. Will start clears today and advance diet as tolerated, as Pt is hungry and would like to eat. Prophylaxis: Heparin sq   Disposition: Anticipate home in 1-3 days  pending clinical improvement.  Subjective:  Pt is doing well this morning. He feels much better than when he came in. He is not having any nausea or vomiting. He had a slight headache this morning that was relieved by Tylenol.  Objective: Temp:  [97.2 F (36.2 C)-98 F (36.7 C)] 97.6 F (36.4 C) (11/16 0348) Pulse Rate:  [77-94] 77 (11/16 0400) Resp:  [15-26] 15 (11/16 0400) BP: (110-220)/(65-132) 126/77 mmHg (11/16 0400) SpO2:  [94 %-99 %] 97 % (11/16 0400) Weight:  [252 lb 12.8 oz (114.669 kg)] 252 lb 12.8 oz (114.669 kg) (11/15 2245) Physical Exam: General: Tired-appearing male, laying on his side in bed, awake and answers questions. HEENT: [place]/AT, EOMI, no scleral icterus, MMM. Neck: Supple Cardiovascular: RRR, no murmurs, 2+ DP pulses bilaterally Respiratory: CTAB, no wheezes Abdomen: +BS, soft, non-tender, non-distended MSK: 1+ pitting edema to the mid-shin bilaterally, moves all 4 extremities spontaneously Skin: Warm and dry, no rashes or lesions. Neuro: Awake, alert, oriented; CN 2-12 grossly intact Psych: Appropriate mood and behavior  Laboratory:  Recent Labs Lab 11/17/14 1811 11/17/14 1840  WBC 11.8*  --   HGB 15.7 16.0  HCT 43.0 47.0  PLT 336  --     Recent Labs Lab 11/17/14 1811 11/17/14 1840 11/17/14 2350 11/18/14 0313  NA 137 137 138 139  K 3.6 3.7 3.9 4.1  CL 101 103 106 108  CO2 20*  --  20* 22  BUN 33* 34* 34* 39*  CREATININE 2.96* 2.60* 3.19* 3.37*  CALCIUM 9.1  --  8.1* 7.9*  PROT 5.7*  --   --   --   BILITOT 1.1  --   --   --   ALKPHOS 94  --   --   --   ALT 22  --   --   --   AST 29  --   --   --   GLUCOSE 434* 453* 436* 329*    VBG: pH 7.389, pCO2 32.2, pO2 31, bicarb 19.5 i-stat troponin: 0.00 UA: few bacteria, >1000 glucose, large Hgb, 15 ketones, negative leukocytes, negative nitrites, >300 protein, 0-5 WBC Beta-hydroxybutyrate: 2.88  Imaging/Diagnostic Tests: None  Campbell Stall, MD 11/18/2014, 6:12 AM PGY-1, [facility]  Family Medicine FPTS Intern pager: [phone], text pages welcome

## 2014-11-18 NOTE — Progress Notes (Addendum)
CRITICAL VALUE ALERT  Critical value received:  Lactic Acid 3.6  Date of notification:  11/18/14  Time of notification:  0400  Critical value read back: yes  Nurse who received alert:  M.Foster SimpsonPiel, RN  MD notified (1st page):  Resident notified  Time of first page:    MD notified (2nd page):  Time of second page:  Responding MD:    Time MD responded:   Resident notified. No new orders at this time.  M.Foster SimpsonPiel, RN

## 2014-11-18 NOTE — Progress Notes (Signed)
Interim Progress Note: Called by Aundra MilletMegan, RN about critical lab value: lactic acid 3.6, up from 2.53 in the ED. Went in to assess patient. He states he is feeling much better. He hasn't been nauseous at all and hasn't had any episodes of vomiting. On exam, he is well-appearing, laying comfortably in bed, normal work of breathing. Low suspicion for sepsis. Will continue IVFs and will trend lactic acid.  Willadean CarolKaty Criselda Starke, MD PGY-1

## 2014-11-19 DIAGNOSIS — G43A1 Cyclical vomiting, intractable: Secondary | ICD-10-CM

## 2014-11-19 DIAGNOSIS — I10 Essential (primary) hypertension: Secondary | ICD-10-CM

## 2014-11-19 LAB — GLUCOSE, CAPILLARY
GLUCOSE-CAPILLARY: 280 mg/dL — AB (ref 65–99)
Glucose-Capillary: 246 mg/dL — ABNORMAL HIGH (ref 65–99)
Glucose-Capillary: 226 mg/dL — ABNORMAL HIGH (ref 65–99)
Glucose-Capillary: 165 mg/dL — ABNORMAL HIGH (ref 65–99)
Glucose-Capillary: 92 mg/dL (ref 65–99)

## 2014-11-19 LAB — CBC
HEMATOCRIT: 41 % (ref 39.0–52.0)
HEMOGLOBIN: 14.4 g/dL (ref 13.0–17.0)
MCH: 28.7 pg (ref 26.0–34.0)
MCHC: 35.1 g/dL (ref 30.0–36.0)
MCV: 81.8 fL (ref 78.0–100.0)
PLATELETS: 343 10*3/uL (ref 150–400)
RBC: 5.01 MIL/uL (ref 4.22–5.81)
RDW: 13.8 % (ref 11.5–15.5)
WBC: 12.7 10*3/uL — ABNORMAL HIGH (ref 4.0–10.5)

## 2014-11-19 LAB — BASIC METABOLIC PANEL
ANION GAP: 11 (ref 5–15)
BUN: 44 mg/dL — ABNORMAL HIGH (ref 6–20)
CALCIUM: 8.6 mg/dL — AB (ref 8.9–10.3)
CO2: 20 mmol/L — ABNORMAL LOW (ref 22–32)
Chloride: 106 mmol/L (ref 101–111)
Creatinine, Ser: 3.19 mg/dL — ABNORMAL HIGH (ref 0.61–1.24)
GFR, EST AFRICAN AMERICAN: 26 mL/min — AB (ref >60)
GFR, EST NON AFRICAN AMERICAN: 22 mL/min — AB (ref >60)
GLUCOSE: 244 mg/dL — AB (ref 65–99)
POTASSIUM: 3.3 mmol/L — AB (ref 3.5–5.1)
Sodium: 137 mmol/L (ref 135–145)

## 2014-11-19 MED ORDER — LORAZEPAM 2 MG/ML IJ SOLN
0.5000 mg | Freq: Once | INTRAMUSCULAR | Status: AC
  Administered 2014-11-19: 0.5 mg via INTRAVENOUS
  Filled 2014-11-19: qty 1

## 2014-11-19 MED ORDER — PROCHLORPERAZINE EDISYLATE 5 MG/ML IJ SOLN
10.0000 mg | Freq: Once | INTRAMUSCULAR | Status: AC
  Administered 2014-11-19: 10 mg via INTRAVENOUS
  Filled 2014-11-19: qty 2

## 2014-11-19 MED ORDER — METOCLOPRAMIDE HCL 5 MG/ML IJ SOLN
5.0000 mg | Freq: Four times a day (QID) | INTRAMUSCULAR | Status: DC | PRN
  Administered 2014-11-19 – 2014-11-20 (×4): 5 mg via INTRAVENOUS
  Filled 2014-11-19 (×4): qty 2

## 2014-11-19 MED ORDER — PANTOPRAZOLE SODIUM 40 MG PO TBEC
40.0000 mg | DELAYED_RELEASE_TABLET | Freq: Every day | ORAL | Status: DC
  Administered 2014-11-19 – 2014-11-20 (×2): 40 mg via ORAL
  Filled 2014-11-19 (×3): qty 1

## 2014-11-19 MED ORDER — HYDRALAZINE HCL 20 MG/ML IJ SOLN
10.0000 mg | Freq: Once | INTRAMUSCULAR | Status: AC
  Administered 2014-11-19: 10 mg via INTRAVENOUS

## 2014-11-19 MED ORDER — INSULIN ASPART 100 UNIT/ML ~~LOC~~ SOLN
5.0000 [IU] | Freq: Once | SUBCUTANEOUS | Status: AC
  Administered 2014-11-19: 5 [IU] via SUBCUTANEOUS

## 2014-11-19 NOTE — Progress Notes (Signed)
Continues to request Carafate for heartburn. MD paged.

## 2014-11-19 NOTE — Progress Notes (Addendum)
Pt has had persistent nausea/vomiting with no relief from antiemetics, continues to have HTN after hydralazine. Pt complaining of heartburn and GI cocktail ordered, but pt refusing. Pt asking for something else for heartburn. MD notified several times throughout the shift of these s/s. No new orders at this time for nausea/vomiting/heartburn or HTN. Ordered 0.5mg  IV ativan once.

## 2014-11-19 NOTE — Progress Notes (Signed)
On rounds requesting Carafate. C/o nausea. No active vomiting.

## 2014-11-19 NOTE — Progress Notes (Signed)
Family Medicine Teaching Service Daily Progress Note Intern Pager: [phone]  Patient name: Russell [name] Medical record number: [id number] Date of birth: 07/15/1970 Age: 44 y.o. Gender: male  Primary Care Provider: Elvina SidleLAUENSTEIN,KURT, MD Consultants: None Code Status: Full   Pt Overview and Major Events to Date:  11/15: Admitted to FPTS with DKA  Assessment and Plan: Russell [name] is a 44 y.o. male presenting with mild DKA. PMH is significant for T2DM, HTN, hypothyroidism, hx of DVT (01/2014), CKD III, GERD.  HTN urgency: He has a long history of uncontrolled blood pressure. Associated with headaches and vomiting. He had a CT head performed in 09/2014, which was normal. Pt had elevated BPs up to 220/108 with headaches on admission. Over the last 24 hours, has had persistently high blood pressures up to 206/113, despite Hydralazine 10mg , then 5mg  q6hrs prn. - Continue home meds: Lisinopril 40mg  qd and Metoprolol succinate 100mg  qd - Hydralazine 5mg  IV q6hrs prn. Will try another Hydralazine 10mg  IV x 1 this am. - Will try Compazine x 1 in addition to his PRN Reglan. Will hold Phenergan for now. - May need to consider treatment for migraines  Resolved Mild DKA in T2DM: Last HgbA1c (09/21/2014): 10.3%. On admission, CBG 398, bicarb 20, pH (VBG) 7.39, AG 16, K 3.6. Beta-hydroxybutyrate 2.88. UA is significant for ketones. Anion gap has closed. CBGs have ranged from 146-240 in the last 24 hours. UOP is 650cc's in the last 24 hours. On exam, he appears euvolemic. - MIVFs: NS at 19100ml/hr given his persistent nausea and vomiting. - Current insulin regimen: Lantus 20mg  daily with sensitive SSI. - Continue home anti-nausea medications: Reglan 5mg  qd prn. Will hold Phenergan and try Compazine x 1 to see if it helps the nausea.  - Strict I/O's - Cardiac monitoring  Mixed restrictive/obstructive pulmonary defect: [facility]. Per chart review, it was thought that  a lot of his symptoms were related to airway edema from nephrotic syndrome. The PFTs are consistent with a restrictive pattern with significant bronchodilator response. He uses Albuterol and Combivent at home. On exam, his lungs are clear and he has normal work of breathing. O2 saturations between 97-98% on room air. - Albuterol nebs q6hrs prn, duonebs q4hrs prn - Continuous pulse ox  Nephrotic syndrome and CKD III: Also has a history of horseshoe kidney. Cr on admission was 2.96 > 3.34 this am. His creatinine has been elevated above 3.0 since 09/2014. They were previously less than 1.5. UA significant for >300 protein. Followed by Dr. Annie SableKellie Goldsborough. He had a renal biopsy done recently, but they didn't get a good sample so it will have to be redone. - [facility]. She recommends that we get him back on diuretics when we can and be careful with fluids because he tends to be fluid overloaded. He also needs to have good BP control before he can get another renal biopsy. - Monitor with daily BMETs.  Hx DVT: Occurred in 01/2014. Was treated with Xarelto, but was told to stop this by Dr. Kathrene BongoGoldsborough (Nephrologist) recently in the setting of renal failure. - Continue to monitor.  Hypothyroidism: Last TSH (09/22/14) was 5.092. - Continue home med: Levothyroxine 50 mcg daily.  Depression/Anxiety: appears stable  - Continue home meds: Xanax 0.5mg  qhs prn, Celexa 20mg  qd  FEN/GI: MIVFs: NS @ 12900ml/hr. Has a heart healthy diet ordered, but has had persistent nausea and vomiting. Prophylaxis: Heparin sq  Disposition:  Anticipate home in 1-3 days pending improvement in nausea/vomiting and stabilization of blood pressures.  Subjective:  Pt having persistent nausea, vomiting, and headache since noon yesterday. Was given Reglan  IV x 1 with  q6hrs by mouth when necessary. BPs remained elevated up to 206/113. He was given IV Hydralazine , with  q6hrs for SBP  >180 and diastolic pressures >110. He continued having HTN after hydralazine. EKG was ordered and showed sinus tachycardia with QT prolongation. He also had heartburn, but refused a GI cocktail and requested something else. Ativan 0.5mg  IV were given. This morning, he states that his headaches and nausea/vomiting have not gotten better since they began yesterday at noon. He states his headaches occur before he starts vomiting, and he usually has high blood pressures before he gets headaches. He did have 1 BM this am.   Objective: Temp:  [97.6 F (36.4 C)-98 F (36.7 C)] 97.6 F (36.4 C) (11/17 0446) Resp:  [7-21] 7 (11/17 0513) BP: (103-206)/(66-113) 185/103 mmHg (11/17 0513) SpO2:  [97 %-98 %] 97 % (11/17 0446) Physical Exam: General: Tired-appearing male, laying on his side in bed, awake and answers questions. HEENT: [place]/AT, EOMI, no scleral icterus, MMM. Neck: Supple Cardiovascular: RRR, no murmurs, 2+ DP pulses bilaterally Respiratory: CTAB, no wheezes Abdomen: +BS, soft, non-distended, mild generalized tenderness throughout. MSK: 1+ pitting edema to the mid-shin bilaterally, moves all 4 extremities spontaneously Skin: Warm and dry, no rashes or lesions. Neuro: Awake, alert, oriented; CN 2-12 grossly intact Psych: Appropriate mood and behavior  Laboratory:  Recent Labs Lab 11/17/14 1811 11/17/14 1840  WBC 11.8*  --   HGB 15.7 16.0  HCT 43.0 47.0  PLT 336  --     Recent Labs Lab 11/17/14 1811  11/18/14 0709 11/18/14 1011 11/18/14 1511  NA 137  < > 138 137 137  K 3.6  < > 3.8 3.6 3.4*  CL 101  < > 107 108 105  CO2 20*  < > BUN 33*  < > 40* 39* 39*  CREATININE 2.96*  < > 3.34* 3.32* 3.28*  CALCIUM 9.1  < > 7.9* 7.9* 8.6*  PROT 5.7*  --   --   --   --   BILITOT 1.1  --   --   --   --   ALKPHOS 94  --   --   --   --   ALT 22  --   --   --   --   AST 29  --   --   --   --   GLUCOSE 434*  < > 142* 176* 240*  < > = values in this interval not  displayed.  VBG: pH 7.389, pCO2 32.2, pO2 31, bicarb 19.5 i-stat troponin: 0.00 UA: few bacteria, >1000 glucose, large Hgb, 15 ketones, negative leukocytes, negative nitrites, >300 protein, 0-5 WBC Beta-hydroxybutyrate: 2.88  Imaging/Diagnostic Tests: None  Campbell Stall, MD 11/19/2014, 6:53 AM PGY-1, [place] [facility] FPTS Intern pager: [phone], text pages welcome

## 2014-11-19 NOTE — Progress Notes (Signed)
Inpatient Diabetes Program Recommendations  AACE/ADA: New Consensus Statement on Inpatient Glycemic Control (2015)  Target Ranges:  Prepandial:   less than 140 mg/dL      Peak postprandial:   less than 180 mg/dL (1-2 hours)      Critically ill patients:  140 - 180 mg/dL   Review of Glycemic Control  Inpatient Diabetes Program Recommendations:  Insulin - Basal: consider increasing Lantus to 35 units  Correction (SSI): Marland Kitchen. Thank you  Piedad ClimesGina Lauranne Beyersdorf BSN, RN,CDE Inpatient Diabetes Coordinator [phone] (team pager)

## 2014-11-19 NOTE — Progress Notes (Addendum)
No active vomiting; observed siiting up on side of bed.

## 2014-11-20 LAB — BASIC METABOLIC PANEL
Anion gap: 10 (ref 5–15)
BUN: 45 mg/dL — ABNORMAL HIGH (ref 6–20)
CO2: 19 mmol/L — ABNORMAL LOW (ref 22–32)
Calcium: 8.2 mg/dL — ABNORMAL LOW (ref 8.9–10.3)
Chloride: 108 mmol/L (ref 101–111)
Creatinine, Ser: 3.08 mg/dL — ABNORMAL HIGH (ref 0.61–1.24)
GFR calc Af Amer: 27 mL/min — ABNORMAL LOW (ref >60)
GFR, EST NON AFRICAN AMERICAN: 23 mL/min — AB (ref >60)
GLUCOSE: 136 mg/dL — AB (ref 65–99)
Potassium: 3.1 mmol/L — ABNORMAL LOW (ref 3.5–5.1)
SODIUM: 137 mmol/L (ref 135–145)

## 2014-11-20 LAB — CBC
HCT: 41.6 % (ref 39.0–52.0)
HEMOGLOBIN: 15 g/dL (ref 13.0–17.0)
MCH: 29.8 pg (ref 26.0–34.0)
MCHC: 36.1 g/dL — AB (ref 30.0–36.0)
MCV: 82.7 fL (ref 78.0–100.0)
PLATELETS: 338 10*3/uL (ref 150–400)
RBC: 5.03 MIL/uL (ref 4.22–5.81)
RDW: 13.9 % (ref 11.5–15.5)
WBC: 14.5 10*3/uL — ABNORMAL HIGH (ref 4.0–10.5)

## 2014-11-20 LAB — GLUCOSE, CAPILLARY
GLUCOSE-CAPILLARY: 134 mg/dL — AB (ref 65–99)
GLUCOSE-CAPILLARY: 140 mg/dL — AB (ref 65–99)
GLUCOSE-CAPILLARY: 119 mg/dL — AB (ref 65–99)
Glucose-Capillary: 142 mg/dL — ABNORMAL HIGH (ref 65–99)

## 2014-11-20 MED ORDER — LANTUS SOLOSTAR 100 UNIT/ML ~~LOC~~ SOPN: 20.0000 [IU] | PEN_INJECTOR | Freq: Every day | SUBCUTANEOUS | Status: DC

## 2014-11-20 MED ORDER — FUROSEMIDE 80 MG PO TABS
80.0000 mg | ORAL_TABLET | Freq: Two times a day (BID) | ORAL | Status: DC
  Administered 2014-11-20: 80 mg via ORAL
  Filled 2014-11-20 (×3): qty 1

## 2014-11-20 MED ORDER — POTASSIUM CHLORIDE CRYS ER 20 MEQ PO TBCR
40.0000 meq | EXTENDED_RELEASE_TABLET | Freq: Two times a day (BID) | ORAL | Status: DC
  Administered 2014-11-20: 40 meq via ORAL
  Filled 2014-11-20 (×2): qty 2

## 2014-11-20 MED ORDER — FUROSEMIDE 80 MG PO TABS: 80.0000 mg | ORAL_TABLET | Freq: Two times a day (BID) | ORAL | Status: DC

## 2014-11-20 MED ORDER — AMLODIPINE BESYLATE 10 MG PO TABS
10.0000 mg | ORAL_TABLET | Freq: Once | ORAL | Status: AC
  Administered 2014-11-20: 10 mg via ORAL
  Filled 2014-11-20: qty 1

## 2014-11-20 NOTE — Progress Notes (Signed)
Family Medicine Teaching Service Daily Progress Note Intern Pager: [phone]  Patient name: Russell [name] Medical record number: [id number] Date of birth: May 01, 1970 Age: 44 y.o. Gender: male  Primary Care Provider: [name], MD Consultants: None Code Status: Full   Pt Overview and Major Events to Date:  11/15: Admitted to FPTS with DKA  Assessment and Plan: Russell [name] is a 44 y.o. male presenting with mild DKA. PMH is significant for T2DM, HTN, hypothyroidism, hx of DVT (01/2014), CKD III, GERD.  HTN urgency: He has a long history of uncontrolled blood pressure. Associated with headaches and vomiting. He had a CT head performed in 09/2014, which was normal. Over the last 24 hours, his blood pressures somewhat improved, but continued to be elevated, up to 186/100. This morning he does have a headache, but it is much better than yesterday. Received PRN Hydralazine x 2 and PRN Reglan x 1. - Continue home meds: Lisinopril  qd and Metoprolol succinate  qd. Will restart Lasix  bid today. If he continues to have elevated BPs, will consider adding Norvasc.  - Hydralazine  IV q6hrs prn - Reglan prn for nausea. - Protonix  qd for GERD - Will hold off on treatment for migraines at this point, but will consider if he has worsening headaches today. - Will transfer out of stepdown until today.  Hypokalemia: K 3.1 today. - Will replace with K-Dur bid today.  Resolved Mild DKA in T2DM: Last HgbA1c (09/21/2014): 10.3%. Anion gap has closed. CBGs have ranged from 92-280 in the last 24 hours. On exam, he appears euvolemic. - Will d/c IVFs today, as he has had good PO intake with no episodes of emesis. - Current insulin regimen: Lantus  daily with sensitive SSI. Will keep him at this dose for now, because he had an am CBG of 92 today. - Continue home anti-nausea medications: Reglan  qd prn.  - Strict I/O's - Cardiac monitoring  Mixed restrictive/obstructive  pulmonary defect: Had abnormal PFTs at [facility] in 07/2014. Per chart review, it was thought that a lot of his symptoms were related to airway edema from nephrotic syndrome. The PFTs are consistent with a restrictive pattern with significant bronchodilator response. He uses Albuterol and Combivent at home. On exam, his lungs are clear and he has normal work of breathing. O2 saturations between 96-98% on room air. - Albuterol nebs q6hrs prn, duonebs q4hrs prn - Continuous pulse ox  Nephrotic syndrome and CKD III: Also has a history of horseshoe kidney. Cr on admission was 2.96 > 3.34 this am. His creatinine has been elevated above 3.0 since 09/2014. They were previously less than 1.5. UA significant for >300 protein. Followed by Dr. Annie Sable. He had a renal biopsy done recently, but they didn't get a good sample so it will have to be redone. - Spoke with Dr. [name] at [facility]. She recommends that we get him back on diuretics when we can and be careful with fluids because he tends to be fluid overloaded. He also needs to have good BP control before he can get another renal biopsy. - Monitor with daily BMETs.  Hx DVT: Occurred in 01/2014. Was treated with Xarelto, but was told to stop this by Dr. [name] (Nephrologist) recently in the setting of renal failure. - Continue to monitor.  Hypothyroidism: Last TSH (09/22/14) was 5.092. - Continue home med: Levothyroxine 50 mcg daily.  Depression/Anxiety: appears stable  - Continue home meds: Xanax 0.5mg  qhs prn, Celexa  qd  FEN/GI: Will d/c fluids as he has had good PO intake and no episodes of vomiting. Heart healthy diet. Prophylaxis: Heparin sq  Disposition: Anticipate home pending stabilization of blood pressures.  Subjective:  Pt states he is still having a headache, but it is much better. He states his nausea has improved and he had no episodes of vomiting overnight. No concerns this am.  Objective: Temp:  [97.5  F (36.4 C)-99.2 F (37.3 C)] 97.8 F (36.6 C) (11/18 0520) Pulse Rate:  [90-94] 90 (11/17 1955) Resp:  [15-23] 22 (11/18 0520) BP: (130-188)/(82-105) 156/105 mmHg (11/18 0520) SpO2:  [96 %-98 %] 98 % (11/17 1955) Physical Exam: General: Tired-appearing male, laying on his side in bed, awake and answers questions, able to stand up without any difficulty. HEENT: [place]/AT, EOMI, no scleral icterus, MMM. Neck: Supple Cardiovascular: RRR, no murmurs, 2+ DP pulses bilaterally Respiratory: CTAB, no wheezes or crackles Abdomen: +BS, soft, non-distended, mild generalized tenderness throughout. MSK: No edema in the lower extremities, moves all 4 extremities spontaneously Skin: Warm and dry, no rashes or lesions. Neuro: Awake, alert, oriented; CN 2-12 grossly intact Psych: Appropriate mood and behavior  Laboratory:  Recent Labs Lab 11/17/14 1811 11/17/14 1840 11/19/14 0800  WBC 11.8*  --  12.7*  HGB 15.7 16.0 14.4  HCT 43.0 47.0 41.0  PLT 336  --  343    Recent Labs Lab 11/17/14 1811  11/18/14 1011 11/18/14 1511 11/19/14 0800  NA 137  < > 137 137 137  K 3.6  < > 3.6 3.4* 3.3*  CL 101  < > 108 105 106  CO2 20*  < > 22 22 20*  BUN 33*  < > 39* 39* 44*  CREATININE 2.96*  < > 3.32* 3.28* 3.19*  CALCIUM 9.1  < > 7.9* 8.6* 8.6*  PROT 5.7*  --   --   --   --   BILITOT 1.1  --   --   --   --   ALKPHOS 94  --   --   --   --   ALT 22  --   --   --   --   AST 29  --   --   --   --   GLUCOSE 434*  < > 176* 240* 244*  < > = values in this interval not displayed.  VBG: pH 7.389, pCO2 32.2, pO2 31, bicarb 19.5 i-stat troponin: 0.00 UA: few bacteria, >1000 glucose, large Hgb, 15 ketones, negative leukocytes, negative nitrites, >300 protein, 0-5 WBC Beta-hydroxybutyrate: 2.88  Imaging/Diagnostic Tests: None  Campbell StallKaty Dodd Florene Brill, MD 11/20/2014, 6:21 AM PGY-1, [facility] FPTS Intern pager: [phone], text pages welcome

## 2014-11-20 NOTE — Progress Notes (Signed)
Report received from Athaliaarolyn, [place] for transfer to [phone]

## 2014-11-20 NOTE — Progress Notes (Signed)
Report called to [place], RN on [address].

## 2014-11-20 NOTE — Progress Notes (Signed)
NURSING PROGRESS NOTE  Russell BirkenheadSteven B Durbin [id number] Transfer Data: 11/20/2014 4:33 PM Attending Provider: Latrelle DodrillBrittany J McIntyre, MD GNF:[id number],IONGPCP:LAUENSTEIN,KURT, MD Code Status: FULL   Russell [name] is a 44 y.o. male patient transferred from 2C -No acute distress noted.  -No complaints of shortness of breath.  -No complaints of chest pain.     Blood pressure 188/104, pulse 75, temperature 98.2 F (36.8 C), temperature source Oral, resp. rate 16, height 6\' 2"  (1.88 m), weight 114.669 kg (252 lb 12.8 oz), SpO2 99 %.    Allergies:  Ibuprofen  Past Medical History:   has a past medical history of Thyroid disease; Diabetes mellitus without complication (HCC); Hypertension; Nephrotic syndrome (05/18/2014); DVT (deep venous thrombosis), H/o 01/2014-on Xarelto (03/24/2014); and Secondary DM with DKA-AG=16, BIcarb Nl (03/24/2014).  Past Surgical History:   has past surgical history that includes right ankle fx repair; Eye surgery; Esophagogastroduodenoscopy (N/A, 03/29/2014); and Tonsillectomy and adenoidectomy.  Social History:   reports that he has never smoked. He has never used smokeless tobacco. He reports that he does not drink alcohol or use illicit drugs.  Skin: intact  Patient/Family orientated to room. Information packet given to patient/family. Admission inpatient armband information verified with patient/family to include name and date of birth and placed on patient arm. Side rails up x 2, fall assessment and education completed with patient/family. Patient/family able to verbalize understanding of risk associated with falls and verbalized understanding to call for assistance before getting out of bed. Call light within reach. Patient/family able to voice and demonstrate understanding of unit orientation instructions.    Will continue to evaluate and treat per MD orders.

## 2014-11-20 NOTE — Progress Notes (Signed)
Patient refusing PM dose of lasix. Patient states he only takes 80mg  lasix once a day and not twice. MD paged. Awaiting response

## 2014-11-21 DIAGNOSIS — R110 Nausea: Secondary | ICD-10-CM

## 2014-11-21 LAB — BASIC METABOLIC PANEL
ANION GAP: 8 (ref 5–15)
BUN: 45 mg/dL — ABNORMAL HIGH (ref 6–20)
CALCIUM: 7.9 mg/dL — AB (ref 8.9–10.3)
CO2: 22 mmol/L (ref 22–32)
CREATININE: 3.19 mg/dL — AB (ref 0.61–1.24)
Chloride: 105 mmol/L (ref 101–111)
GFR, EST AFRICAN AMERICAN: 26 mL/min — AB (ref >60)
GFR, EST NON AFRICAN AMERICAN: 22 mL/min — AB (ref >60)
Glucose, Bld: 114 mg/dL — ABNORMAL HIGH (ref 65–99)
Potassium: 3.1 mmol/L — ABNORMAL LOW (ref 3.5–5.1)
SODIUM: 135 mmol/L (ref 135–145)

## 2014-11-21 LAB — CBC
HCT: 39.4 % (ref 39.0–52.0)
Hemoglobin: 13.6 g/dL (ref 13.0–17.0)
MCH: 28.5 pg (ref 26.0–34.0)
MCHC: 34.5 g/dL (ref 30.0–36.0)
MCV: 82.6 fL (ref 78.0–100.0)
PLATELETS: 301 10*3/uL (ref 150–400)
RBC: 4.77 MIL/uL (ref 4.22–5.81)
RDW: 13.6 % (ref 11.5–15.5)
WBC: 7.3 10*3/uL (ref 4.0–10.5)

## 2014-11-21 LAB — GLUCOSE, CAPILLARY: GLUCOSE-CAPILLARY: 119 mg/dL — AB (ref 65–99)

## 2014-11-21 MED ORDER — SUCRALFATE 1 G PO TABS: 1.0000 g | ORAL_TABLET | Freq: Three times a day (TID) | ORAL | Status: DC

## 2014-11-21 MED ORDER — PROMETHAZINE HCL 25 MG PO TABS: 25.0000 mg | ORAL_TABLET | Freq: Four times a day (QID) | ORAL | Status: DC | PRN

## 2014-11-21 MED ORDER — POTASSIUM CHLORIDE CRYS ER 20 MEQ PO TBCR: 60.0000 meq | EXTENDED_RELEASE_TABLET | Freq: Every day | ORAL | Status: DC

## 2014-11-21 MED ORDER — HYDRALAZINE HCL 10 MG PO TABS: 10.0000 mg | ORAL_TABLET | Freq: Three times a day (TID) | ORAL | Status: DC

## 2014-11-21 MED ORDER — AMLODIPINE BESYLATE 10 MG PO TABS: 10.0000 mg | ORAL_TABLET | Freq: Every day | ORAL | Status: DC

## 2014-11-21 NOTE — Progress Notes (Signed)
Patient discharge teaching given, including activity, diet, follow-up appoints, and medications. Patient verbalized understanding of all discharge instructions. IV access was d/c'd. Vitals are stable. Skin is intact except as charted in most recent assessments. Pt to be escorted out by NT, to be driven home by family. 

## 2014-11-21 NOTE — Progress Notes (Signed)
Family Medicine Teaching Service Daily Progress Note Intern Pager: [phone]  Patient name: Russell [name] Medical record number: [id number] Date of birth: [birth year] Age: 44 y.o. Gender: male  Primary Care Provider: [name], MD Consultants: None Code Status: Full   Pt Overview and Major Events to Date:  11/15: Admitted to FPTS with DKA  Assessment and Plan: Russell [name] is a 44 y.o. male presenting with mild DKA. PMH is significant for T2DM, HTN, hypothyroidism, hx of DVT (01/2014), CKD III, GERD.  HTN urgency: He has a long history of uncontrolled blood pressure. Associated with headaches and vomiting. He had a CT head performed in 09/2014, which was normal. Overnight, his blood pressures have looked much better that earlier in his hospitalization, ranging from 148-166/69-102. This morning he denies any headaches or vomiting. Received PRN Hydralazine x 2 yesterday, with the last one at 1655. - Continue home meds: Lisinopril  qd and Metoprolol succinate  qd. Restarted Lasix  bid on 11/18. Started Norvasc  daily on 11/18. - Hydralazine  IV q6hrs prn. Received 2 doses in the last 24 hours. - Reglan prn for nausea. - Protonix  qd for GERD  Hypokalemia: K 3.1 today. Likely secondary to Lasix. - Will give K-Dur this morning.  Resolved Mild DKA in T2DM: Last HgbA1c (09/21/2014): 10.3%. Anion gap has closed. CBGs have ranged from 119-142 in the last 24 hours. On exam, he appears euvolemic. - Current insulin regimen: Lantus  daily with sensitive SSI. Will discharge home at this dose. - Continue home anti-nausea medications: Reglan  qd prn.  - Strict I/O's - Cardiac monitoring  Mixed restrictive/obstructive pulmonary defect: Had abnormal PFTs at [facility] in 07/2014. Per chart review, it was thought that a lot of his symptoms were related to airway edema from nephrotic syndrome. The PFTs are consistent with a restrictive pattern with  significant bronchodilator response. He uses Albuterol and Combivent at home. On exam, his lungs are clear and he has normal work of breathing. O2 saturations between 97-99% on room air. - Albuterol nebs q6hrs prn, duonebs q4hrs prn. - Continuous pulse ox  Nephrotic syndrome and CKD III: Also has a history of horseshoe kidney. Cr on admission was 2.96 > 3.34 this am. His creatinine has been elevated above 3.0 since 09/2014. They were previously less than 1.5. UA significant for >300 protein. Followed by Dr. Annie Sable. He had a renal biopsy done recently, but they didn't get a good sample so it will have to be redone. - Spoke with Dr. [name] at [facility]. She recommends that we get him back on diuretics when we can and be careful with fluids because he tends to be fluid overloaded. He also needs to have good BP control before he can get another renal biopsy. - Monitor with daily BMETs.  Hx DVT: Occurred in 01/2014. Was treated with Xarelto, but was told to stop this by Dr. [name] (Nephrologist) recently in the setting of renal failure. - Continue to monitor.  Hypothyroidism: Last TSH (09/22/14) was 5.092. - Continue home med: Levothyroxine 50 mcg daily.  Depression/Anxiety: appears stable  - Continue home meds: Xanax 0.5mg  qhs prn, Celexa  qd  FEN/GI: Heart healthy diet, SLIV. Prophylaxis: Heparin sq  Disposition: Home today, as BPs have much improved.  Subjective:  Pt states he is doing well this morning. He denies any headaches or vomiting. He would like to go home today.  Objective: Temp:  [97.4 F (36.3 C)-98.2 F (36.8 C)] 98.2 F (36.8 C) (  11/19 0651) Pulse Rate:  [75-87] 84 (11/19 0651) Resp:  [11-21] 17 (11/19 0651) BP: (148-213)/(69-121) 166/97 mmHg (11/19 0651) SpO2:  [97 %-99 %] 99 % (11/19 16100651) Physical Exam: General: Tired-appearing male, laying on his side in bed, awake and answers questions. HEENT: [place]/AT, EOMI, no scleral icterus, MMM. Neck:  Supple Cardiovascular: RRR, no murmurs, 2+ DP pulses bilaterally Respiratory: CTAB, no wheezes or crackles Abdomen: +BS, soft, non-distended, non-tender MSK: No edema in the lower extremities, moves all 4 extremities spontaneously Skin: Warm and dry, no rashes or lesions. Neuro: Awake, alert, oriented; CN 2-12 grossly intact Psych: Appropriate mood and behavior  Laboratory:  Recent Labs Lab 11/19/14 0800 11/20/14 0709 11/21/14 0410  WBC 12.7* 14.5* 7.3  HGB 14.4 15.0 13.6  HCT 41.0 41.6 39.4  PLT 343 338 301    Recent Labs Lab 11/17/14 1811  11/19/14 0800 11/20/14 0709 11/21/14 0410  NA 137  < > 137 137 135  K 3.6  < > 3.3* 3.1* 3.1*  CL 101  < > 106 108 105  CO2 20*  < > 20* 19* 22  BUN 33*  < > 44* 45* 45*  CREATININE 2.96*  < > 3.19* 3.08* 3.19*  CALCIUM 9.1  < > 8.6* 8.2* 7.9*  PROT 5.7*  --   --   --   --   BILITOT 1.1  --   --   --   --   ALKPHOS 94  --   --   --   --   ALT 22  --   --   --   --   AST 29  --   --   --   --   GLUCOSE 434*  < > 244* 136* 114*  < > = values in this interval not displayed.  VBG: pH 7.389, pCO2 32.2, pO2 31, bicarb 19.5 i-stat troponin: 0.00 UA: few bacteria, >1000 glucose, large Hgb, 15 ketones, negative leukocytes, negative nitrites, >300 protein, 0-5 WBC Beta-hydroxybutyrate: 2.88  Imaging/Diagnostic Tests: None  Campbell StallKaty Dodd Cloyce Blankenhorn, MD 11/21/2014, 8:15 AM PGY-1, [facility] FPTS Intern pager: [phone], text pages welcome

## 2014-11-21 NOTE — Discharge Instructions (Signed)
You were hospitalized because you had diabetic ketoacidosis. To prevent this from happening in the future, please take your Lantus daily.   1. Please take Lantus 20mg  daily. Your primary care physician will adjust this as needed. 2. Please continue taking Lasix 80mg  twice a day. 3. We have started another blood pressure medication called Norvasc (amlodipine). Please take this daily.

## 2014-11-22 NOTE — Discharge Summary (Signed)
Family Medicine [facility] Discharge Summary  Patient name: Russell [name] Medical record number: [id number] Date of birth: Oct 02, 1970 Age: 44 y.o. 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 HEENT: /AT, EOMI, no scleral icterus, MMM. Neck: Supple Cardiovascular: RRR, no murmurs, 2+ DP pulses bilaterally Respiratory: CTAB, no wheezes or crackles Abdomen: +BS, soft, non-distended, non-tender MSK: No edema in the lower extremities, moves all 4 extremities spontaneously Skin: Warm and dry, no rashes or lesions. Neuro: Awake, alert, oriented; CN 2-12 grossly intact Psych: Appropriate mood and behavior  Brief Hospital Course:  Russell [name] is a 44 year old male who presented to the ED with 2 days of nausea and vomiting. In the ED, he was found to be in mild DKA with an anion gap of 16, +beta-hydroxybutyrate, and pH of 7.39 on VBG. His blood pressures were also elevated to 220/108 and he endorsed severe headache. He was admitted for further management. Hospital course is described by problem list below.  1. Mild DKA: In the ED, he was started on an insulin drip and MIVFs NS at 14325ml/hr. His CBGs were in the high 300s. Per DKA protocol, he was continued on an insulin drip until his anion gap was closed x 2. He was given Lantus 20 units 2 hours before the insulin drip was stopped. Once his blood sugars were below 250, he was transitioned to D5NS and allowed to start a clear liquid diet. He was treated with  sensitive sliding scale insulin with meals. Once his DKA resolved, his CBGs remained in good control throughout the remainder of his hospitalization. He was discharged home on Lantus 20 units and was told to stop taking Humalog on discharge. His Lantus should be increased and Humalog should be restarted as needed.   2. Hypertensive urgency: On admission, Russell [name] had BPs up to 220/108, as he had been vomiting up his BP meds at home. He has a long history of uncontrolled blood pressures. We continued his home meds- Lisinopril 40mg  qd and Metoprolol succinate 100mg  qd. He continued to have high blood pressures. He was treated multiple times with prn Hydralazine 5mg  for BPs >180/110. We started him on Norvasc 10mg  daily. On the day of discharge, his blood pressures improved to the 140s-160s systolic.   The remainder of his medical conditions were stable and no changes were made to his home medications.  Issues for Follow Up:  1. Pt was discharged on Lantus 20 units. He was previously taking Lantus 30 units daily and Humalog per sliding scale. We recommend his insulin regimen be adjusted as an outpatient. 2. His current anti-hypertensive regimen: Norvasc 10mg  daily, Hydralazine 10mg  tid, Lisinopril 40mg  qd, Metoprolol succinate 100mg  daily.  Significant Procedures: None  Significant Labs and Imaging:   Recent Labs Lab 11/19/14 0800 11/20/14 0709 11/21/14 0410  WBC 12.7* 14.5* 7.3  HGB 14.4 15.0 13.6  HCT 41.0 41.6 39.4  PLT 343 338 301    Recent Labs Lab 11/17/14 1811 11/17/14 1824  11/17/14 2350  11/18/14 1011 11/18/14 1511 11/19/14 0800 11/20/14 0709 11/21/14 0410  NA 137  --   < >  138  < > 137 137 137 137 135  K 3.6  --   < > 3.9  < > 3.6 3.4* 3.3* 3.1* 3.1*  CL 101  --   < > 106  < > 108 105 106 108 105  CO2 20*  --   --  20*  < > 22 22 20* 19* 22  GLUCOSE 434*  --   < > 436*  < > 176* 240* 244* 136* 114*  BUN 33*  --   < > 34*  < > 39* 39* 44* 45* 45*  CREATININE 2.96*  --    < > 3.19*  < > 3.32* 3.28* 3.19* 3.08* 3.19*  CALCIUM 9.1  --   --  8.1*  < > 7.9* 8.6* 8.6* 8.2* 7.9*  MG  --  1.9  --   --   --   --   --   --   --   --   PHOS  --   --   --  5.9*  --   --   --   --   --   --   ALKPHOS 94  --   --   --   --   --   --   --   --   --   AST 29  --   --   --   --   --   --   --   --   --   ALT 22  --   --   --   --   --   --   --   --   --   ALBUMIN 2.3*  --   --   --   --   --   --   --   --   --   < > = values in this interval not displayed.  VBG: pH 7.389, pCO2 32.2, pO2 31, bicarb 19.5 i-stat troponin: 0.00 UA: few bacteria, >1000 glucose, large Hgb, 15 ketones, negative leukocytes, negative nitrites, >300 protein, 0-5 WBC Beta-hydroxybutyrate: 2.88  Results/Tests Pending at Time of Discharge: None  Discharge Medications:    Medication List    STOP taking these medications        Insulin Lispro 200 UNIT/ML Sopn  Commonly known as:  HUMALOG KWIKPEN      TAKE these medications        acetaminophen 500 MG tablet  Commonly known as:  TYLENOL  Take 1 tablet (500 mg total) by mouth every 6 (six) hours as needed for moderate pain.     albuterol 108 (90 BASE) MCG/ACT inhaler  Commonly known as:  PROVENTIL HFA;VENTOLIN HFA  Inhale 1 puff into the lungs every 6 (six) hours as needed for wheezing or shortness of breath.     albuterol (2.5 MG/3ML) 0.083% nebulizer solution  Commonly known as:  PROVENTIL  Take 3 mLs (2.5 mg total) by nebulization every 6 (six) hours as needed for wheezing or shortness of breath.     albuterol-ipratropium 18-103 MCG/ACT inhaler  Commonly known as:  COMBIVENT  Inhale 2 puffs into the lungs every 4 (four) hours.     ALPRAZolam 0.5 MG tablet  Commonly known as:  XANAX  TAKE 1 TABLET TWICE DAILY AS NEEDED FOR SLEEP     amLODipine 10 MG tablet  Commonly known as:  NORVASC  Take 1 tablet (10 mg total) by mouth daily.     citalopram 20 MG tablet  Commonly  known as:  CELEXA  Take 1 tablet (20 mg total) by mouth  daily.     cyclobenzaprine 10 MG tablet  Commonly known as:  FLEXERIL  Take 1 tablet (10 mg total) by mouth at bedtime.     furosemide 80 MG tablet  Commonly known as:  LASIX  Take 1 tablet (80 mg total) by mouth 2 (two) times daily.     gabapentin 100 MG capsule  Commonly known as:  NEURONTIN  Take 1 capsule (100 mg total) by mouth at bedtime.     hydrALAZINE 10 MG tablet  Commonly known as:  APRESOLINE  Take 1 tablet (10 mg total) by mouth 3 (three) times daily.     HYDROmorphone 2 MG tablet  Commonly known as:  DILAUDID  Take 1 tablet (2 mg total) by mouth every 6 (six) hours as needed for severe pain.     LANTUS SOLOSTAR 100 UNIT/ML Solostar Pen  Generic drug:  Insulin Glargine  Inject 20 Units into the skin daily.     levothyroxine 50 MCG tablet  Commonly known as:  SYNTHROID, LEVOTHROID  Take 1 tablet (50 mcg total) by mouth daily.     lisinopril 40 MG tablet  Commonly known as:  PRINIVIL,ZESTRIL  Take 40 mg by mouth daily.     metoCLOPramide 5 MG tablet  Commonly known as:  REGLAN  Take 1 tablet (5 mg total) by mouth 4 (four) times daily.     metoprolol succinate 100 MG 24 hr tablet  Commonly known as:  TOPROL XL  Take 1 tablet (100 mg total) by mouth daily.     promethazine 25 MG tablet  Commonly known as:  PHENERGAN  Take 1 tablet (25 mg total) by mouth every 6 (six) hours as needed for nausea.     sucralfate 1 G tablet  Commonly known as:  CARAFATE  Take 1 tablet (1 g total) by mouth 4 (four) times daily -  with meals and at bedtime.        Discharge Instructions: Please refer to Patient Instructions section of EMR for full details.  Patient was counseled important signs and symptoms that should prompt return to medical care, changes in medications, dietary instructions, activity restrictions, and follow up appointments.   Follow-Up Appointments:   Campbell Stall, MD 11/22/2014, 12:50 PM PGY-1, [facility]

## 2014-11-24 ENCOUNTER — Ambulatory Visit (INDEPENDENT_AMBULATORY_CARE_PROVIDER_SITE_OTHER): Payer: Managed Care, Other (non HMO) | Admitting: Family Medicine

## 2014-11-24 VITALS — BP 112/68 | HR 87 | Temp 98.6°F | Resp 16 | Ht 74.0 in | Wt 266.8 lb

## 2014-11-24 DIAGNOSIS — H109 Unspecified conjunctivitis: Secondary | ICD-10-CM | POA: Diagnosis not present

## 2014-11-24 DIAGNOSIS — N049 Nephrotic syndrome with unspecified morphologic changes: Secondary | ICD-10-CM

## 2014-11-24 DIAGNOSIS — G4459 Other complicated headache syndrome: Secondary | ICD-10-CM

## 2014-11-24 DIAGNOSIS — E08311 Diabetes mellitus due to underlying condition with unspecified diabetic retinopathy with macular edema: Secondary | ICD-10-CM

## 2014-11-24 DIAGNOSIS — I10 Essential (primary) hypertension: Secondary | ICD-10-CM

## 2014-11-24 DIAGNOSIS — E1140 Type 2 diabetes mellitus with diabetic neuropathy, unspecified: Secondary | ICD-10-CM | POA: Diagnosis not present

## 2014-11-24 DIAGNOSIS — R5381 Other malaise: Secondary | ICD-10-CM

## 2014-11-24 DIAGNOSIS — R600 Localized edema: Secondary | ICD-10-CM

## 2014-11-24 MED ORDER — TOBRAMYCIN 0.3 % OP SOLN: 1.0000 [drp] | OPHTHALMIC | Status: DC

## 2014-11-24 MED ORDER — HYDROMORPHONE HCL 2 MG PO TABS: 2.0000 mg | ORAL_TABLET | Freq: Four times a day (QID) | ORAL | Status: DC | PRN

## 2014-11-24 NOTE — Patient Instructions (Signed)
I will call your doctors and tried to come up with a long-term plan for getting office much medicine and getting a better diagnosis of the root cause of all of your malaise and suffering.

## 2014-11-24 NOTE — Progress Notes (Addendum)
This chart was scribed for Russell Sidle, MD by [name] medical scribe at [facility].The patient was seen in exam room 12 and the patient's care was started at 12:24 PM.  Patient ID: [name] MRN: [id number], DOB: Dec 16, 1970, 44 y.o. Date of Encounter: 11/24/2014  Primary Physician: Russell Sidle, MD  Chief Complaint:  Chief Complaint  Patient presents with  . Follow-up    Post hospital visit  . Medication Refill    hydromorphone  . New Rx    would like a neb machine    HPI:  Russell [name] is a 44 y.o. male with a hx of nephrotic syndrome, renal failure, DM, and HTN who presents to [facility] for a post hospital admission for mild ADK and hypertensive emergency on 11/15 follow up.  Pt states that he feels "like dying slowly", he notes that he has not been improving since January. Pt is very frustrated due to his symptoms, and he indicates that he is "feeling sad". Pt reports that he was denied his long term disability 3 times. Pt has forms that he would like to get filled out to provide proof that he still follows up here with me.   Muscle weakness: He indicates that he experiences trouble walking that he had 2 falls yesterday, and severe weakness that he is very concerned incase of a fall he would not be able to get back up.   Emesis: Pt also report that he is still experiencing sore throat due to his emesis symptoms. He reports that his emesis symptoms are not improving.    Diabetes: Pt states that his blood sugar is ranging in the 120's.   Hypertension: Pt also reports symptoms of headaches, and he indicates that he attributed it to his HTN that usually runs in the 200's level systolic, however his blood pressure was in the 120's range yesterday and today. Pt takes dilaudid when his headaches symptoms are very severe which sometimes it is everyday.   CKD: Pt states that his kidney disease is worsening due to not able  to urinate everyday. He indicates that his body is very prone to bruising and edema. Pt indicates that he is finding relief with his lasix. Pt discussed with his specialist the possibility of getting a transplant. He is considering this option. Pt only has one kidney.   Eye Problem:  Pt states that since his hospital visit, he is experiencing crusting of the eyes when waking up, and mild discharge of the lids. He notes that his mother is experiencing similar symptoms.   Pt notes that the following medications are the ones he takes on a daily bases: citalopram, metoprolol, lisinopril, flexeril, Neurontin, and lantus. Pt is requesting a medication refill for hydromorphone, and is requesting a nebulizer machine.    Past Medical History  Diagnosis Date  . Thyroid disease   . Diabetes mellitus without complication (HCC)   . Hypertension   . Nephrotic syndrome 05/18/2014  . DVT (deep venous thrombosis), H/o 01/2014-on Xarelto 03/24/2014  . Secondary DM with DKA-AG=16, BIcarb Nl 03/24/2014     Home Meds: Prior to Admission medications   Medication Sig Start Date End Date Taking? Authorizing Provider  clonazePAM (KLONOPIN) 1 MG tablet Take 1 mg by mouth 2 (two) times daily.   Yes Historical Provider, MD  acetaminophen (TYLENOL) 500 MG tablet Take 1 tablet (500 mg total) by mouth every 6 (six) hours as needed for moderate pain. 09/29/14  Albertine Grates, MD  albuterol (PROVENTIL HFA;VENTOLIN HFA) 108 (90 BASE) MCG/ACT inhaler Inhale 1 puff into the lungs every 6 (six) hours as needed for wheezing or shortness of breath.    Historical Provider, MD  albuterol (PROVENTIL) (2.5 MG/3ML) 0.083% nebulizer solution Take 3 mLs (2.5 mg total) by nebulization every 6 (six) hours as needed for wheezing or shortness of breath. 09/03/14   Russell Sidle, MD  albuterol-ipratropium (COMBIVENT) 18-103 MCG/ACT inhaler Inhale 2 puffs into the lungs every 4 (four) hours. Patient taking differently: Inhale 2 puffs into the lungs  every 4 (four) hours. Take as needed 09/03/14   Russell Sidle, MD  ALPRAZolam Prudy Feeler) 0.5 MG tablet TAKE 1 TABLET TWICE DAILY AS NEEDED FOR SLEEP 10/12/14   Russell Sidle, MD  amLODipine (NORVASC) 10 MG tablet Take 1 tablet (10 mg total) by mouth daily. 11/21/14   Campbell Stall, MD  citalopram (CELEXA) 20 MG tablet Take 1 tablet (20 mg total) by mouth daily. Patient taking differently: Take 40 mg by mouth daily.  10/12/14   Russell Sidle, MD  cyclobenzaprine (FLEXERIL) 10 MG tablet Take 1 tablet (10 mg total) by mouth at bedtime. 10/12/14   Russell Sidle, MD  furosemide (LASIX) 80 MG tablet Take 1 tablet (80 mg total) by mouth 2 (two) times daily. 11/20/14   Campbell Stall, MD  gabapentin (NEURONTIN) 100 MG capsule Take 1 capsule (100 mg total) by mouth at bedtime. 09/29/14   Albertine Grates, MD  hydrALAZINE (APRESOLINE) 10 MG tablet Take 1 tablet (10 mg total) by mouth 3 (three) times daily. 11/21/14   Campbell Stall, MD  HYDROmorphone (DILAUDID) 2 MG tablet Take 1 tablet (2 mg total) by mouth every 6 (six) hours as needed for severe pain. 10/12/14   Russell Sidle, MD  LANTUS SOLOSTAR 100 UNIT/ML Solostar Pen Inject 20 Units into the skin daily. 11/20/14   Campbell Stall, MD  levothyroxine (SYNTHROID, LEVOTHROID) 50 MCG tablet Take 1 tablet (50 mcg total) by mouth daily. 05/02/13   Russell Sidle, MD  lisinopril (PRINIVIL,ZESTRIL) 40 MG tablet Take 40 mg by mouth daily.  09/25/14   Historical Provider, MD  metoCLOPramide (REGLAN) 5 MG tablet Take 1 tablet (5 mg total) by mouth 4 (four) times daily. Patient taking differently: Take 5 mg by mouth daily as needed.  10/12/14   Russell Sidle, MD  metoprolol succinate (TOPROL-XL) 100 MG 24 hr tablet Take 1 tablet (100 mg total) by mouth daily. 05/20/14   Lewayne Bunting, MD  promethazine (PHENERGAN) 25 MG tablet Take 1 tablet (25 mg total) by mouth every 6 (six) hours as needed for nausea. 11/21/14   Campbell Stall, MD  sucralfate (CARAFATE) 1 G tablet  Take 1 tablet (1 g total) by mouth 4 (four) times daily -  with meals and at bedtime. 11/21/14   Campbell Stall, MD    Allergies:  Allergies  Allergen Reactions  . Ibuprofen Other (See Comments)    MD told him not to take due to kidney disease.    Social History   Social History  . Marital Status: Significant Other    Spouse Name: N/A  . Number of Children: N/A  . Years of Education: N/A   Occupational History  .      Maintenance Curator   Social History Main Topics  . Smoking status: Never Smoker   . Smokeless tobacco: Never Used  . Alcohol Use: No  . Drug Use: No  . Sexual Activity: Not  on file   Other Topics Concern  . Not on file   Social History Narrative   Patient currently lives with his fiance   Works with facilities   Nonsmoker, nondrinker        Review of Systems: Constitutional: negative for chills, fever, night sweats, weight changes, or fatigue  HEENT: negative for vision changes, hearing loss, congestion, rhinorrhea, ST, epistaxis, or sinus pressure. Positive for eye problems. Cardiovascular: negative for chest pain or palpitations Respiratory: negative for hemoptysis, wheezing, shortness of breath, or cough Abdominal: negative for abdominal pain, nausea, vomiting, diarrhea, or constipation. Positive for vomiting.  Dermatological: negative for rash. Positive for color change.  Neurologic: negative dizziness, or syncope. Positive for muscle weakness, gait problems, headaches.  Genitourinary: positive for urinary retention.   Msk: positive for joint swelling.  All other systems reviewed and are otherwise negative with the exception to those above and in the HPI.  Physical Exam: Blood pressure 112/68, pulse 87, temperature 98.6 F (37 C), temperature source Oral, resp. rate 16, height 6\' 2"  (1.88 m), weight 266 lb 12.8 oz (121.02 kg), SpO2 98 %., Body mass index is 34.24 kg/(m^2). General: Well developed, well nourished, in no acute distress. Head:  Normocephalic, atraumatic, eyes without discharge, sclera non-icteric, nares are without discharge. Bilateral auditory canals clear, TM's are without perforation, pearly grey and translucent with reflective cone of light bilaterally. Oral cavity moist, posterior pharynx without exudate, erythema, peritonsillar abscess, or post nasal drip.  Neck: Supple. No thyromegaly. Full ROM. No lymphadenopathy. Lungs: Clear bilaterally to auscultation without wheezes, rales, or rhonchi. Breathing is unlabored. Heart: RRR with S1 S2. No murmurs, rubs, or gallops appreciated. Abdomen: Soft, non-tender, non-distended with normoactive bowel sounds. No hepatomegaly. No rebound/guarding. No obvious abdominal masses. Msk:  Strength and tone normal for age. Extremities/Skin: Warm and dry. No clubbing or cyanosis. No edema. No rashes or suspicious lesions. Neuro: Alert and oriented X 3. Moves all extremities spontaneously. Gait is normal. CNII-XII grossly in tact. Psych:  Responds to questions appropriately with a normal affect.   Labs:  ASSESSMENT AND PLAN:  44 y.o. year old male with    By signing my name below, I, Rawaa Al Rifaie, attest that this documentation has been prepared under the direction and in the presence of Russell SidleKurt Madaline Lefeber, MD.  Broadus Johnawaa Al Rifaie, Medical Scribe. 11/24/2014.  12:50 PM.  This chart was scribed in my presence and reviewed by me personally.    ICD-9-CM ICD-10-CM   1. Bilateral conjunctivitis 372.30 H10.9 tobramycin (TOBREX) 0.3 % ophthalmic solution  2. Type 2 diabetes, controlled, with neuropathy (HCC) 250.60 E11.40    357.2    3. 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 Malaise 780.79 R53.81   5. Nephrotic syndrome 581.9 N04.9   6. Essential hypertension 401.9 I10   7. Bilateral leg edema 782.3 R60.0   8. Other complicated headache  syndrome 339.44 G44.59 HYDROmorphone (DILAUDID) 2 MG tablet   Care for this individual has been incredibly difficult. He seems to be compliant, and he's had excellent consultation from his endocrinologist and kidney doctor, but we still don't know why his blood pressure erratically shoots up and is kidneys continue to deteriorate. The cause of the nephrotic syndrome is elusive and patient is feeling worse every month. I'll be calling his doctors to see if we can come up with a  more comprehensive plan following his most recent episode of fluid retention and DKA.  Signed, Russell Sidle, MD 11/24/2014 12:24 PM    I spoke with Dr. [name] on the phone today after I saw the patient. She'll be seeing him next week and will try to work on long-term plan at that time possibly including a biopsy. I just wanted to give her a heads up on his being discouraged and feeling bad, as well as recurring admissions for high blood pressure and uncontrolled diabetes.

## 2014-12-14 LAB — BASIC METABOLIC PANEL
BUN: 49 mg/dL — AB (ref 4–21)
Glucose: 286 mg/dL
POTASSIUM: 5.2 mmol/L (ref 3.4–5.3)
Sodium: 140 mmol/L (ref 137–147)

## 2014-12-14 LAB — CBC AND DIFFERENTIAL: WBC: 6.2 10*3/mL

## 2014-12-15 DIAGNOSIS — Z0271 Encounter for disability determination: Secondary | ICD-10-CM

## 2014-12-30 ENCOUNTER — Encounter (HOSPITAL_COMMUNITY): Payer: Self-pay | Admitting: Internal Medicine

## 2014-12-30 ENCOUNTER — Inpatient Hospital Stay (HOSPITAL_COMMUNITY): Payer: Managed Care, Other (non HMO)

## 2014-12-30 ENCOUNTER — Ambulatory Visit: Payer: Managed Care, Other (non HMO) | Admitting: Internal Medicine

## 2014-12-30 ENCOUNTER — Inpatient Hospital Stay (HOSPITAL_COMMUNITY)
Admission: AD | Admit: 2014-12-30 | Discharge: 2015-01-08 | DRG: 674 | Disposition: A | Discharge status: Home / Self Care | Payer: Managed Care, Other (non HMO) | Source: Ambulatory Visit | Attending: Internal Medicine | Admitting: Internal Medicine

## 2014-12-30 DIAGNOSIS — N184 Chronic kidney disease, stage 4 (severe): Secondary | ICD-10-CM | POA: Diagnosis present

## 2014-12-30 DIAGNOSIS — I472 Ventricular tachycardia: Secondary | ICD-10-CM | POA: Diagnosis not present

## 2014-12-30 DIAGNOSIS — N189 Chronic kidney disease, unspecified: Secondary | ICD-10-CM

## 2014-12-30 DIAGNOSIS — J441 Chronic obstructive pulmonary disease with (acute) exacerbation: Secondary | ICD-10-CM | POA: Diagnosis not present

## 2014-12-30 DIAGNOSIS — R062 Wheezing: Secondary | ICD-10-CM

## 2014-12-30 DIAGNOSIS — E1122 Type 2 diabetes mellitus with diabetic chronic kidney disease: Secondary | ICD-10-CM | POA: Diagnosis present

## 2014-12-30 DIAGNOSIS — Z8719 Personal history of other diseases of the digestive system: Secondary | ICD-10-CM | POA: Diagnosis not present

## 2014-12-30 DIAGNOSIS — E1151 Type 2 diabetes mellitus with diabetic peripheral angiopathy without gangrene: Secondary | ICD-10-CM | POA: Diagnosis present

## 2014-12-30 DIAGNOSIS — M549 Dorsalgia, unspecified: Secondary | ICD-10-CM | POA: Diagnosis present

## 2014-12-30 DIAGNOSIS — R0602 Shortness of breath: Secondary | ICD-10-CM | POA: Diagnosis not present

## 2014-12-30 DIAGNOSIS — R339 Retention of urine, unspecified: Secondary | ICD-10-CM | POA: Diagnosis not present

## 2014-12-30 DIAGNOSIS — N185 Chronic kidney disease, stage 5: Secondary | ICD-10-CM | POA: Diagnosis not present

## 2014-12-30 DIAGNOSIS — T380X5A Adverse effect of glucocorticoids and synthetic analogues, initial encounter: Secondary | ICD-10-CM | POA: Diagnosis not present

## 2014-12-30 DIAGNOSIS — E1141 Type 2 diabetes mellitus with diabetic mononeuropathy: Secondary | ICD-10-CM | POA: Diagnosis not present

## 2014-12-30 DIAGNOSIS — E11319 Type 2 diabetes mellitus with unspecified diabetic retinopathy without macular edema: Secondary | ICD-10-CM | POA: Diagnosis present

## 2014-12-30 DIAGNOSIS — E0841 Diabetes mellitus due to underlying condition with diabetic mononeuropathy: Secondary | ICD-10-CM | POA: Diagnosis not present

## 2014-12-30 DIAGNOSIS — N186 End stage renal disease: Secondary | ICD-10-CM | POA: Diagnosis present

## 2014-12-30 DIAGNOSIS — I160 Hypertensive urgency: Secondary | ICD-10-CM | POA: Diagnosis present

## 2014-12-30 DIAGNOSIS — E875 Hyperkalemia: Secondary | ICD-10-CM | POA: Diagnosis not present

## 2014-12-30 DIAGNOSIS — E8770 Fluid overload, unspecified: Secondary | ICD-10-CM | POA: Diagnosis present

## 2014-12-30 DIAGNOSIS — E1165 Type 2 diabetes mellitus with hyperglycemia: Secondary | ICD-10-CM | POA: Diagnosis present

## 2014-12-30 DIAGNOSIS — F419 Anxiety disorder, unspecified: Secondary | ICD-10-CM | POA: Diagnosis present

## 2014-12-30 DIAGNOSIS — Z794 Long term (current) use of insulin: Secondary | ICD-10-CM

## 2014-12-30 DIAGNOSIS — K219 Gastro-esophageal reflux disease without esophagitis: Secondary | ICD-10-CM | POA: Diagnosis present

## 2014-12-30 DIAGNOSIS — N179 Acute kidney failure, unspecified: Secondary | ICD-10-CM | POA: Diagnosis present

## 2014-12-30 DIAGNOSIS — D631 Anemia in chronic kidney disease: Secondary | ICD-10-CM | POA: Diagnosis present

## 2014-12-30 DIAGNOSIS — E669 Obesity, unspecified: Secondary | ICD-10-CM | POA: Diagnosis present

## 2014-12-30 DIAGNOSIS — R51 Headache: Secondary | ICD-10-CM | POA: Diagnosis present

## 2014-12-30 DIAGNOSIS — Q631 Lobulated, fused and horseshoe kidney: Secondary | ICD-10-CM

## 2014-12-30 DIAGNOSIS — I120 Hypertensive chronic kidney disease with stage 5 chronic kidney disease or end stage renal disease: Secondary | ICD-10-CM | POA: Diagnosis present

## 2014-12-30 DIAGNOSIS — Z7951 Long term (current) use of inhaled steroids: Secondary | ICD-10-CM

## 2014-12-30 DIAGNOSIS — F329 Major depressive disorder, single episode, unspecified: Secondary | ICD-10-CM | POA: Diagnosis present

## 2014-12-30 DIAGNOSIS — IMO0002 Reserved for concepts with insufficient information to code with codable children: Secondary | ICD-10-CM | POA: Diagnosis present

## 2014-12-30 DIAGNOSIS — Z6833 Body mass index (BMI) 33.0-33.9, adult: Secondary | ICD-10-CM

## 2014-12-30 DIAGNOSIS — R9431 Abnormal electrocardiogram [ECG] [EKG]: Secondary | ICD-10-CM | POA: Diagnosis present

## 2014-12-30 DIAGNOSIS — H3530 Unspecified macular degeneration: Secondary | ICD-10-CM | POA: Diagnosis present

## 2014-12-30 DIAGNOSIS — E1143 Type 2 diabetes mellitus with diabetic autonomic (poly)neuropathy: Secondary | ICD-10-CM | POA: Diagnosis present

## 2014-12-30 DIAGNOSIS — G8929 Other chronic pain: Secondary | ICD-10-CM | POA: Diagnosis present

## 2014-12-30 DIAGNOSIS — R2240 Localized swelling, mass and lump, unspecified lower limb: Secondary | ICD-10-CM | POA: Diagnosis not present

## 2014-12-30 DIAGNOSIS — J440 Chronic obstructive pulmonary disease with acute lower respiratory infection: Secondary | ICD-10-CM | POA: Diagnosis not present

## 2014-12-30 DIAGNOSIS — K2210 Ulcer of esophagus without bleeding: Secondary | ICD-10-CM | POA: Diagnosis present

## 2014-12-30 DIAGNOSIS — Z79899 Other long term (current) drug therapy: Secondary | ICD-10-CM

## 2014-12-30 DIAGNOSIS — Z95828 Presence of other vascular implants and grafts: Secondary | ICD-10-CM

## 2014-12-30 DIAGNOSIS — I4581 Long QT syndrome: Secondary | ICD-10-CM | POA: Diagnosis present

## 2014-12-30 DIAGNOSIS — Z992 Dependence on renal dialysis: Secondary | ICD-10-CM

## 2014-12-30 DIAGNOSIS — E785 Hyperlipidemia, unspecified: Secondary | ICD-10-CM | POA: Diagnosis present

## 2014-12-30 DIAGNOSIS — E039 Hypothyroidism, unspecified: Secondary | ICD-10-CM | POA: Diagnosis present

## 2014-12-30 DIAGNOSIS — E1121 Type 2 diabetes mellitus with diabetic nephropathy: Secondary | ICD-10-CM | POA: Diagnosis present

## 2014-12-30 HISTORY — DX: Headache, unspecified: R51.9

## 2014-12-30 HISTORY — DX: Type 2 diabetes mellitus with diabetic nephropathy: E11.21

## 2014-12-30 HISTORY — DX: Unspecified asthma, uncomplicated: J45.909

## 2014-12-30 HISTORY — DX: Headache: R51

## 2014-12-30 HISTORY — DX: Anxiety disorder, unspecified: F41.9

## 2014-12-30 HISTORY — DX: Depression, unspecified: F32.A

## 2014-12-30 HISTORY — DX: Hypothyroidism, unspecified: E03.9

## 2014-12-30 HISTORY — DX: Major depressive disorder, single episode, unspecified: F32.9

## 2014-12-30 LAB — COMPREHENSIVE METABOLIC PANEL
ALT: 16 U/L — ABNORMAL LOW (ref 17–63)
AST: 17 U/L (ref 15–41)
Albumin: 1.9 g/dL — ABNORMAL LOW (ref 3.5–5.0)
Alkaline Phosphatase: 74 U/L (ref 38–126)
Anion gap: 9 (ref 5–15)
BILIRUBIN TOTAL: 0.7 mg/dL (ref 0.3–1.2)
BUN: 47 mg/dL — ABNORMAL HIGH (ref 6–20)
CHLORIDE: 108 mmol/L (ref 101–111)
CO2: 21 mmol/L — ABNORMAL LOW (ref 22–32)
CREATININE: 4.06 mg/dL — AB (ref 0.61–1.24)
Calcium: 7.4 mg/dL — ABNORMAL LOW (ref 8.9–10.3)
GFR, EST AFRICAN AMERICAN: 19 mL/min — AB (ref >60)
GFR, EST NON AFRICAN AMERICAN: 17 mL/min — AB (ref >60)
Glucose, Bld: 348 mg/dL — ABNORMAL HIGH (ref 65–99)
POTASSIUM: 4.6 mmol/L (ref 3.5–5.1)
Sodium: 138 mmol/L (ref 135–145)
TOTAL PROTEIN: 4.5 g/dL — AB (ref 6.5–8.1)

## 2014-12-30 LAB — CBC WITH DIFFERENTIAL/PLATELET
Basophils Absolute: 0 10*3/uL (ref 0.0–0.1)
Basophils Relative: 1 %
EOS PCT: 4 %
Eosinophils Absolute: 0.3 10*3/uL (ref 0.0–0.7)
HEMATOCRIT: 32.5 % — AB (ref 39.0–52.0)
Hemoglobin: 10.9 g/dL — ABNORMAL LOW (ref 13.0–17.0)
Lymphocytes Relative: 15 %
Lymphs Abs: 1.1 10*3/uL (ref 0.7–4.0)
MCH: 28.2 pg (ref 26.0–34.0)
MCHC: 33.5 g/dL (ref 30.0–36.0)
MCV: 84 fL (ref 78.0–100.0)
Monocytes Absolute: 0.6 10*3/uL (ref 0.1–1.0)
Monocytes Relative: 8 %
Neutro Abs: 5.4 10*3/uL (ref 1.7–7.7)
Neutrophils Relative %: 72 %
PLATELETS: 243 10*3/uL (ref 150–400)
RBC: 3.87 MIL/uL — ABNORMAL LOW (ref 4.22–5.81)
RDW: 13.3 % (ref 11.5–15.5)
WBC: 7.5 10*3/uL (ref 4.0–10.5)

## 2014-12-30 LAB — PHOSPHORUS: PHOSPHORUS: 5.4 mg/dL — AB (ref 2.5–4.6)

## 2014-12-30 LAB — GLUCOSE, CAPILLARY
GLUCOSE-CAPILLARY: 318 mg/dL — AB (ref 65–99)
GLUCOSE-CAPILLARY: 201 mg/dL — AB (ref 65–99)

## 2014-12-30 LAB — MAGNESIUM: MAGNESIUM: 1.5 mg/dL — AB (ref 1.7–2.4)

## 2014-12-30 LAB — TROPONIN I: Troponin I: <0.03 ng/mL (ref <0.031)

## 2014-12-30 MED ORDER — INSULIN ASPART 100 UNIT/ML ~~LOC~~ SOLN
0.0000 [IU] | Freq: Three times a day (TID) | SUBCUTANEOUS | Status: DC
  Administered 2014-12-30: 7 [IU] via SUBCUTANEOUS
  Administered 2014-12-31 (×2): 1 [IU] via SUBCUTANEOUS
  Administered 2014-12-31: 3 [IU] via SUBCUTANEOUS
  Administered 2015-01-01: 1 [IU] via SUBCUTANEOUS
  Administered 2015-01-01: 3 [IU] via SUBCUTANEOUS
  Administered 2015-01-02: 2 [IU] via SUBCUTANEOUS
  Administered 2015-01-02 – 2015-01-03 (×2): 1 [IU] via SUBCUTANEOUS
  Administered 2015-01-03: 2 [IU] via SUBCUTANEOUS
  Administered 2015-01-04: 7 [IU] via SUBCUTANEOUS
  Administered 2015-01-04: 5 [IU] via SUBCUTANEOUS

## 2014-12-30 MED ORDER — INSULIN ASPART 100 UNIT/ML ~~LOC~~ SOLN
6.0000 [IU] | Freq: Three times a day (TID) | SUBCUTANEOUS | Status: DC
  Administered 2014-12-30 – 2015-01-04 (×15): 6 [IU] via SUBCUTANEOUS

## 2014-12-30 MED ORDER — METOPROLOL SUCCINATE ER 100 MG PO TB24
100.0000 mg | ORAL_TABLET | Freq: Every day | ORAL | Status: DC
  Administered 2014-12-31 – 2015-01-08 (×9): 100 mg via ORAL
  Filled 2014-12-30 (×9): qty 1

## 2014-12-30 MED ORDER — INSULIN GLARGINE 100 UNIT/ML ~~LOC~~ SOLN
25.0000 [IU] | Freq: Every day | SUBCUTANEOUS | Status: DC
  Administered 2014-12-31: 25 [IU] via SUBCUTANEOUS
  Filled 2014-12-30: qty 0.25

## 2014-12-30 MED ORDER — DEXTROSE 5 % IV SOLN
160.0000 mg | Freq: Four times a day (QID) | INTRAVENOUS | Status: DC
  Administered 2014-12-30 – 2014-12-31 (×6): 160 mg via INTRAVENOUS
  Filled 2014-12-30 (×11): qty 16

## 2014-12-30 MED ORDER — CLONIDINE HCL 0.1 MG PO TABS
0.1000 mg | ORAL_TABLET | Freq: Three times a day (TID) | ORAL | Status: DC
Start: 2014-12-30 — End: 2015-01-03
  Administered 2014-12-30 – 2015-01-03 (×12): 0.1 mg via ORAL
  Filled 2014-12-30 (×12): qty 1

## 2014-12-30 MED ORDER — CITALOPRAM HYDROBROMIDE 40 MG PO TABS
40.0000 mg | ORAL_TABLET | Freq: Every day | ORAL | Status: DC
  Administered 2014-12-31 – 2015-01-08 (×8): 40 mg via ORAL
  Filled 2014-12-30 (×9): qty 1

## 2014-12-30 MED ORDER — AMLODIPINE BESYLATE 10 MG PO TABS
10.0000 mg | ORAL_TABLET | Freq: Every day | ORAL | Status: DC
  Administered 2014-12-31 – 2015-01-03 (×4): 10 mg via ORAL
  Filled 2014-12-30 (×4): qty 1

## 2014-12-30 MED ORDER — PANTOPRAZOLE SODIUM 40 MG PO TBEC
40.0000 mg | DELAYED_RELEASE_TABLET | Freq: Every day | ORAL | Status: DC
  Administered 2014-12-31 – 2015-01-08 (×8): 40 mg via ORAL
  Filled 2014-12-30 (×9): qty 1

## 2014-12-30 MED ORDER — ACETAMINOPHEN 325 MG PO TABS
650.0000 mg | ORAL_TABLET | Freq: Four times a day (QID) | ORAL | Status: DC | PRN
  Administered 2014-12-30 – 2015-01-08 (×6): 650 mg via ORAL
  Filled 2014-12-30 (×5): qty 2

## 2014-12-30 MED ORDER — HEPARIN SODIUM (PORCINE) 5000 UNIT/ML IJ SOLN
5000.0000 [IU] | Freq: Three times a day (TID) | INTRAMUSCULAR | Status: DC
  Administered 2014-12-30 – 2015-01-07 (×2): 5000 [IU] via SUBCUTANEOUS
  Filled 2014-12-30 (×7): qty 1

## 2014-12-30 MED ORDER — SODIUM CHLORIDE 0.9 % IJ SOLN
3.0000 mL | Freq: Two times a day (BID) | INTRAMUSCULAR | Status: DC
Start: 2014-12-30 — End: 2015-01-08
  Administered 2014-12-30 – 2015-01-08 (×17): 3 mL via INTRAVENOUS

## 2014-12-30 MED ORDER — HYDRALAZINE HCL 10 MG PO TABS
10.0000 mg | ORAL_TABLET | Freq: Three times a day (TID) | ORAL | Status: DC
  Administered 2014-12-30 – 2014-12-31 (×3): 10 mg via ORAL
  Filled 2014-12-30 (×3): qty 1

## 2014-12-30 MED ORDER — LEVOTHYROXINE SODIUM 50 MCG PO TABS
50.0000 ug | ORAL_TABLET | Freq: Every day | ORAL | Status: DC
  Administered 2014-12-31 – 2015-01-08 (×8): 50 ug via ORAL
  Filled 2014-12-30 (×8): qty 1

## 2014-12-30 MED ORDER — CYCLOBENZAPRINE HCL 10 MG PO TABS
10.0000 mg | ORAL_TABLET | Freq: Every day | ORAL | Status: DC
  Administered 2014-12-30 – 2015-01-07 (×9): 10 mg via ORAL
  Filled 2014-12-30 (×9): qty 1

## 2014-12-30 MED ORDER — INSULIN GLARGINE 100 UNIT/ML SOLOSTAR PEN: 25.0000 [IU] | PEN_INJECTOR | Freq: Every day | SUBCUTANEOUS | Status: DC

## 2014-12-30 MED ORDER — INSULIN ASPART 100 UNIT/ML ~~LOC~~ SOLN
0.0000 [IU] | Freq: Every day | SUBCUTANEOUS | Status: DC
  Administered 2014-12-31: 2 [IU] via SUBCUTANEOUS

## 2014-12-30 MED ORDER — CLONAZEPAM 1 MG PO TABS
1.0000 mg | ORAL_TABLET | Freq: Two times a day (BID) | ORAL | Status: DC | PRN
  Administered 2015-01-05 – 2015-01-07 (×3): 1 mg via ORAL
  Filled 2014-12-30 (×3): qty 1

## 2014-12-30 MED ORDER — METOCLOPRAMIDE HCL 5 MG PO TABS: 5.0000 mg | ORAL_TABLET | Freq: Every day | ORAL | Status: DC | PRN

## 2014-12-30 MED ORDER — HYDRALAZINE HCL 20 MG/ML IJ SOLN: 10.0000 mg | INTRAMUSCULAR | Status: DC | PRN

## 2014-12-30 MED ORDER — GABAPENTIN 400 MG PO CAPS
400.0000 mg | ORAL_CAPSULE | Freq: Every day | ORAL | Status: DC
  Administered 2014-12-30 – 2015-01-07 (×9): 400 mg via ORAL
  Filled 2014-12-30 (×9): qty 1

## 2014-12-30 MED ORDER — ACETAMINOPHEN 500 MG PO TABS: 500.0000 mg | ORAL_TABLET | Freq: Four times a day (QID) | ORAL | Status: DC | PRN

## 2014-12-30 MED ORDER — HYDROMORPHONE HCL 2 MG PO TABS
2.0000 mg | ORAL_TABLET | Freq: Four times a day (QID) | ORAL | Status: DC | PRN
  Administered 2014-12-30 – 2015-01-08 (×20): 2 mg via ORAL
  Filled 2014-12-30 (×20): qty 1

## 2014-12-30 MED ORDER — PROMETHAZINE HCL 25 MG PO TABS
25.0000 mg | ORAL_TABLET | Freq: Four times a day (QID) | ORAL | Status: DC | PRN
  Administered 2015-01-03: 25 mg via ORAL
  Filled 2014-12-30: qty 1

## 2014-12-30 NOTE — H&P (Addendum)
[facility] History and Physical  Russell [name]:[id number] DOB: [birth year] DOA: 12/30/2014  Referring physician:  Marjory Sneddon, Nephrology, direct admit from clinic PCP:  [name], MD   Chief Complaint:  Wheezing, weight gain  HPI:  The patient is a 44 y.o. 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  He has had progressive kidney disease with 4 hospitalizations in the last 6 months for uncontrolled diabetes, nausea, vomiting, hypertensive urgency, and AKI.     In April 2016, his creatinine was 0.98 but rapidly increased and was as high as 6.41 in September 2016.  The patient states that a year ago he weighted around 200-lbs and that he has gained about 70-lbs which he attributes to water weight, however, most of his weights from the last 2 years have been 240-250-lbs at the lowest.  He had an attempt at biopsy over the summer at [facility], however, it was nondiagnostic and a difficult procedure.  They suspected that he had diabetic nephropathy.  He has been managed by Dr. [name] as an outpatient and she has seen him frequently.  He has continued to gain weight despite escalating his lasix from 80mg  po BID to 160mg  po BID and adding metolazone.  He has developed wheezing and DOE with full body edema, orthopnea, and fatigue.  In clinic, his SBP was 180, other vital signs wnl and he was directly admitted for diuresis in the setting of AKI.  Labs were  not sent from linic, but last creatinine from 11/19 was 3.19 and stat labs from arrival are pending.    Review of Systems:  General:  Denies fevers, chills, 70-lb weight gain over last year HEENT:  Denies changes to hearing but has progressively worsening vision, denies rhinorrhea, sinus congestion, sore throat CV:  Denies chest pain and palpitations, positive lower extremity edema.  PULM:  Denies SOB, wheezing, cough.   GI:  Intermittent nausea, vomiting, denies current constipation or diarrhea.   GU:  Denies dysuria, frequency, urgency, but voiding less despite higher doses of lasix ENDO:  Denies polyuria, polydipsia.   HEME:  Denies hematemesis, blood in stools, melena, abnormal bruising or bleeding.  LYMPH:  Denies lymphadenopathy.   MSK:  Denies arthralgias, myalgias.   DERM:  Denies skin rash or ulcer.   NEURO:  Denies focal numbness, weakness, slurred speech, confusion, facial droop.  + headache PSYCH:  Denies anxiety and depression.    Past Medical History  Diagnosis Date  . Thyroid disease   . Type 2 diabetes mellitus with diabetic nephropathy (HCC)   . Hypertension   . Nephrotic syndrome 05/18/2014  . DVT (deep venous thrombosis), H/o 01/2014-on Xarelto 03/24/2014  . Secondary DM with DKA-AG=16, BIcarb Nl 03/24/2014  . CKD stage 3 due to type 2 diabetes mellitus [facility])    Past Surgical History  Procedure Laterality Date  . Right ankle fx repair    . Eye surgery    . Esophagogastroduodenoscopy N/A 03/29/2014    Procedure: ESOPHAGOGASTRODUODENOSCOPY (EGD);  Surgeon: Dorena Cookey, MD;  Location: [facility];  Service: Endoscopy;  Laterality: N/A;  . Tonsillectomy and adenoidectomy  Social History:  reports that he has never smoked. He has never used smokeless tobacco. He reports that he does not drink alcohol or use illicit drugs.  Allergies  Allergen Reactions  . Ibuprofen Other (See Comments)    MD told him not to take due to kidney disease.    Family History  Problem  Relation Age of Onset  . Obesity Mother     Patient states that family members have no other medical illnesses other than what I have described  . Heart disease      No family history  . Cancer Father 50    AML  . Kidney cancer Maternal Grandmother      Prior to Admission medications   Medication Sig Start Date End Date Taking? Authorizing Provider  cloNIDine (CATAPRES) 0.1 MG tablet Take 0.1 mg by mouth 3 (three) times daily.   Yes Historical Provider, MD  gabapentin (NEURONTIN) 100 MG capsule Take 1 capsule (100 mg total) by mouth at bedtime. Patient taking differently: Take 400 mg by mouth at bedtime.  09/29/14  Yes Albertine GratesFang Xu, MD  insulin lispro (HUMALOG) 100 UNIT/ML injection Inject 0-15 Units into the skin 3 (three) times daily before meals. Carb coverage   Yes Historical Provider, MD  pantoprazole (PROTONIX) 40 MG tablet Take 40 mg by mouth daily.   Yes Historical Provider, MD  sucralfate (CARAFATE) 1 GM/10ML suspension Take 1 g by mouth 4 (four) times daily -  with meals and at bedtime.   Yes Historical Provider, MD  acetaminophen (TYLENOL) 500 MG tablet Take 1 tablet (500 mg total) by mouth every 6 (six) hours as needed for moderate pain. 09/29/14   Albertine GratesFang Xu, MD  albuterol (PROVENTIL HFA;VENTOLIN HFA) 108 (90 BASE) MCG/ACT inhaler Inhale 1 puff into the lungs every 6 (six) hours as needed for wheezing or shortness of breath.    Historical Provider, MD  albuterol (PROVENTIL) (2.5 MG/3ML) 0.083% nebulizer solution Take 3 mLs (2.5 mg total) by nebulization every 6 (six) hours as needed for wheezing or shortness of breath. 09/03/14   [name], MD  albuterol-ipratropium (COMBIVENT) 18-103 MCG/ACT inhaler Inhale 2 puffs into the lungs every 4 (four) hours. Patient taking differently: Inhale 2 puffs into the lungs every 4 (four) hours. Take as needed 09/03/14   [name], MD  ALPRAZolam Prudy Feeler(XANAX) 0.5 MG tablet TAKE 1 TABLET TWICE DAILY AS NEEDED FOR SLEEP 10/12/14   [name], MD   amLODipine (NORVASC) 10 MG tablet Take 1 tablet (10 mg total) by mouth daily. 11/21/14   Campbell StallKaty Dodd Mayo, MD  citalopram (CELEXA) 20 MG tablet Take 1 tablet (20 mg total) by mouth daily. Patient taking differently: Take 40 mg by mouth daily.  10/12/14   [name], MD  clonazePAM (KLONOPIN) 1 MG tablet Take 1 mg by mouth 2 (two) times daily as needed for anxiety.     Historical Provider, MD  cyclobenzaprine (FLEXERIL) 10 MG tablet Take 1 tablet (10 mg total) by mouth at bedtime. 10/12/14   [name], MD  furosemide (LASIX) 80 MG tablet Take 1 tablet (80 mg total) by mouth 2 (two) times daily. Patient taking differently: Take 160 mg by mouth 2 (two) times daily.  11/20/14   Campbell StallKaty Dodd Mayo, MD  hydrALAZINE (APRESOLINE) 10 MG tablet Take 1 tablet (10 mg total) by mouth 3 (three) times daily. 11/21/14   Campbell StallKaty Dodd Mayo, MD  HYDROmorphone (DILAUDID) 2 MG tablet Take 1 tablet (2 mg total) by mouth every 6 (six) hours  as needed for severe pain. 11/24/14   [name], MD  LANTUS SOLOSTAR 100 UNIT/ML Solostar Pen Inject 20 Units into the skin daily. Patient taking differently: Inject 30 Units into the skin daily.  11/20/14   Campbell Stall, MD  levothyroxine (SYNTHROID, LEVOTHROID) 50 MCG tablet Take 1 tablet (50 mcg total) by mouth daily. 05/02/13   [name], MD  metoCLOPramide (REGLAN) 5 MG tablet Take 1 tablet (5 mg total) by mouth 4 (four) times daily. Patient taking differently: Take 5 mg by mouth daily as needed.  10/12/14   [name], MD  metoprolol succinate (TOPROL-XL) 100 MG 24 hr tablet Take 1 tablet (100 mg total) by mouth daily. 05/20/14   Lewayne Bunting, MD  promethazine (PHENERGAN) 25 MG tablet Take 1 tablet (25 mg total) by mouth every 6 (six) hours as needed for nausea. 11/21/14   Campbell Stall, MD  tobramycin (TOBREX) 0.3 % ophthalmic solution Place 1 drop into both eyes every 4 (four) hours. Patient taking differently: Place 1 drop into both eyes every 4 (four)  hours. Only as needed before eye procedures (next scheduled for 01/07/2014) 11/24/14   [name], MD   Physical Exam: Filed Vitals:   12/30/14 1330  BP: 212/109  Pulse: 91  Temp: 98.6 F (37 C)  TempSrc: Oral  Resp: 22  Height:  (1.88 m)  Weight: 124 kg (273 lb 5.9 oz)  SpO2: 100%     General:  Obese male, mild respiratory distress with pauses for breathing during conversation  Eyes:  PERRL, anicteric, non-injected.  ENT:  Nares clear.  OP clear, non-erythematous without plaques or exudates.  MMM.  Neck:  Supple without TM or JVD.    Lymph:  No cervical, supraclavicular, or submandibular LAD.  Cardiovascular:  RRR, normal S1, loud S2, without m/r/g.  2+ pulses, warm extremities  Respiratory:  Diminished bilateral breath sounds at bases with high pitched expiratory wheeze, no rales or rhonchi  Abdomen:  NABS.  Soft, ND/NT.    Skin:  No rashes or focal lesions.  Musculoskeletal:  Normal bulk and tone.  Woody bilateral LE edema.  Arms and skin 1+ edema, puffy  Psychiatric:  A & O x 4.  Appropriate affect.  Neurologic:  CN 3-12 intact.  5/5 strength.  Sensation intact.  Labs on Admission:  Basic Metabolic Panel: No results for input(s): NA, K, CL, CO2, GLUCOSE, BUN, CREATININE, CALCIUM, MG, PHOS in the last 168 hours. Liver Function Tests: No results for input(s): AST, ALT, ALKPHOS, BILITOT, PROT, ALBUMIN in the last 168 hours. No results for input(s): LIPASE, AMYLASE in the last 168 hours. No results for input(s): AMMONIA in the last 168 hours. CBC: No results for input(s): WBC, NEUTROABS, HGB, HCT, MCV, PLT in the last 168 hours. Cardiac Enzymes: No results for input(s): CKTOTAL, CKMB, CKMBINDEX, TROPONINI in the last 168 hours.  BNP (last 3 results) No results for input(s): BNP in the last 8760 hours.  ProBNP (last 3 results) No results for input(s): PROBNP in the last 8760 hours.  CBG: No results for input(s): GLUCAP in the last 168  hours.  Radiological Exams on Admission: Portable Chest 1 View  12/30/2014  CLINICAL DATA:  Wheezing for 3 days. EXAM: PORTABLE CHEST 1 VIEW COMPARISON:  09/24/2014 FINDINGS: Heart is borderline in size. Lungs are clear. No effusions or edema. No acute bony abnormality. IMPRESSION: No active disease. Electronically Signed   By: Charlett Nose M.D.   On: 12/30/2014 16:09  EKG:  pending  Assessment/Plan Principal Problem:   Acute renal failure superimposed on stage 4 chronic kidney disease (HCC) Active Problems:   Hypothyroidism (acquired)   Diabetic neuropathy (HCC)   Uncontrolled diabetes mellitus type 2 with peripheral artery disease (HCC)   Acute on chronic renal failure (HCC)   Hx of gastroesophageal reflux (GERD)   Prolonged Q-T interval on ECG   AKI (acute kidney injury) (HCC)   Hypertensive urgency  ---  Acute on chronic renal failure reported by his nephrologist, creatinine from 12/27 was 4.1.  Dry weight probably near 250-lbs, currently 273-lbs.   - patient reportedly had a renal vein clotting on renal artery [place] at [facility], however, renal artery duplex done on 9/21 demonstrated no renal artery stenosis, patent bilateral renal veins - Start IV lasix  IV q6h -  Daily creatinine -  Renal diet -  Daily weights and strict I/O -  Check potassium and magnesium and supplement electrolytes as neeeded  Hypertensive urgency, missed his mid-day blood pressure medications during admission process.  Headache and wheezing without focal neurologic deficits -  ECG and cycle troponins -  Defer CT head for now and will resume pain medications and blood pressure medications -  q4h neuro checks until SBP < 180  - resume Metoprolol 100 mg PO ER  - Hydralazine 10 mg PO TID  - clonidine 0.1mg  TID -  No ACEI or ARB -  Diuresis as above -  Additional IV hydralazine as needed  Mixed restrictive/obstructive pulmonary defect (per outpatient PFT done at [facility]).  Suspect wheezing is due  to hypervolumia -  CXR - stable, albuterol as needed  Uncontrolled diabetes mellitus type 2 with peripheral artery disease, diabetic retinopathy, and neuropathy - decrease lantus to 25 units  -  Schedule aspart 6 units with meals with low dose SSI -  Repeat A1c, last was 10.3 on 09/21/2014 -  May need to cancel his eye appointment on 01/08/15  Obesity, needs weight loss  History of superficial venous thrombosis, H/o 01/2014  (no evident of DVT) - Xarelto discontinued due to worsening of renal function - Hematology Dr. Merlene Pulling who saw patient in 06/2014 for anticoagulation issues who discontinued anticoagulation since hypercoagulable panel was negative and the patient had been more active  Diabetic neuropathy - continue Gabapentin  Hypothyroidism (acquired) - continue Synthroid   Erosive esophagitis -  D/c carafate to minimize aluminum toxicity -  Continue protonix (but consider stopping this due to progressive CKD)  Depression/anxiety, stable, continue celexa.  Patient on two benzodiazepines at home.  Will continue clonazepam for now and hold xanax.    Diet:  renal Access:  PIV IVF:  off Proph:  heparin  Code Status: full Family Communication: patient alone Disposition Plan: Admit to telemetry  Time spent: 60 min Renae Fickle [facility] Pager [phone]  If 7PM-7AM, please contact night-coverage [email or web address] Password [facility] 12/30/2014, 4:19 PM

## 2014-12-30 NOTE — Progress Notes (Signed)
New Admission Note: direct admission  Arrival Method: wheelchair Mental Orientation: a/o x4 Telemetry: none Assessment: Completed Skin: clean, dry, intact IV: none Pain: headache Tubes: none Safety Measures: Safety Fall Prevention Plan has been given, discussed and signed Admission: Completed Unit Orientation: Patient has been orientated to the room, unit and staff.  Family: none present upon arrival to unit  Orders have been reviewed and implemented. Will continue to monitor the patient. Call light has been placed within reach and bed alarm has been activated. Still waiting for orders to be placed  Janeann ForehandLuke Amenda Duclos BSN, RN

## 2014-12-30 NOTE — Consult Note (Signed)
[facility] Renal Consultation Note  Requesting MD: Short Indication for Consultation: CKD 4 , volume overload   HPI:  Russell [name] is a 44 y.o. male withdiabetes mellitus for close to 10 years with complications of retinopathy and neuropathy. He also has hypertension, chronic back pain, hyperlipidemia, hypothyroidism, obesity. He reports that his diabetes and hypertension appeared to be under reasonable control until the fall of 2015. He had an episode of pneumonia and then after that has had issues with diffuse swelling. He had weight gain and urinalysis showed greater than 300 of protein whereas in 2014 and early 2015, seemed to be less. In January 2016, he had lower extremity pain and was found to have a DVT and started on Xarelto. He has also seen Cardiology along the way and there is a question of a slight decrease in ejection fraction, but again probably the edema is mostly due to the nephrotic syndrome. He was admitted somewhere along the line in 2015/ early 2016 with a lot of volume overload, was diuresed with IV diuretics. He had a 24-hour urine collection in the spring which showed over 7000 mg of proteinuria. 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 He has had low albumins in the low 3's/ high 2's. He has had some issues with shortness of breath and gastroparesis, but again may be due to just this overall volume overload. I saw him for the first time in August of 2016. He was quite overloaded and hypertensive and we emarked on a plan for diuresis. I had him come back in 2 weeks and he managed to gain 7 pounds so diuretics were increased. Since then he has had 2 hospitalizations- creatinine rising and he remained overloaded.  I then started him come to the clinic every 2 weeks so that we can achieve diuresis and attempt to minimize issues that will give him  A on CRF.  He returns today- six weeks ago he was at 280#- 4 weeks ago at 266# and two weeks ago 261#. He was noted to not be taking Lasix as I had recommended. I had also started him on cellcept as empiric tx for nephrotic syndrome understanding that we did not have tissue at 500 BID- I had wanted to increase the dose but he could not tolerate it.- I felt that repeat biopsy again might be risky and his BP parameters were not good enough to safely perform. Today his weight is noted to be 273 pounds. He is sick again has been in bed. Has had difficulty taking his medications due to vomiting and there is again some controversy that he wasn't taking his diuretics as prescribed. Things were getting worse but he did not call. Pre-labs show a creatinine now 4.1 with a BUN of 43. His protein creatinine ratio is 14 g and his albumin is 2.6. He is again frustrated- we talked and I felt the best course of action was to admit for IV diuresis- get and AVF and see if we would be able to maintain him off of dialysis asOP  until AVF usable  CREAT  Date/Time Value Ref Range Status  05/26/2014 07:46 PM 1.56* 0.50 - 1.35 mg/dL Final  16/10/9602 54:09 AM 1.10 0.50 - 1.35 mg/dL Final  81/19/1478 29:56 AM 0.98 0.50 - 1.35 mg/dL Final  21/30/8657 84:69 PM 0.94 0.50 - 1.35 mg/dL Final  62/95/2841 32:44 AM  1.80* 0.50 - 1.35 mg/dL Final  91/47/829512/27/2015 62:1310:06 AM 0.90 0.50 - 1.35 mg/dL Final  08/65/784609/11/2013 96:2909:51 AM 0.87 0.50 - 1.35 mg/dL Final  52/84/132404/30/2015 40:1006:09 PM 0.85 0.50 - 1.35 mg/dL Final  27/25/366412/08/2012 40:3410:21 AM 1.05 0.50 - 1.35 mg/dL Final  74/25/956311/26/2013 87:5602:19 PM 0.92 0.50 - 1.35 mg/dL Final   CREATININE, SER  Date/Time Value Ref Range Status  11/21/2014 04:10 AM 3.19* 0.61 - 1.24 mg/dL Final  43/32/951811/18/2016 84:1607:09 AM 3.08* 0.61 - 1.24 mg/dL Final  60/63/016011/17/2016 10:9308:00 AM 3.19* 0.61 - 1.24 mg/dL Final  23/55/732211/16/2016 02:5403:11 PM 3.28* 0.61 - 1.24 mg/dL Final  27/06/237611/16/2016 28:3110:11 AM 3.32* 0.61 - 1.24 mg/dL Final  51/76/160711/16/2016 37:1007:09 AM 3.34*  0.61 - 1.24 mg/dL Final  62/69/485411/16/2016 62:7003:13 AM 3.37* 0.61 - 1.24 mg/dL Final  35/00/938111/15/2016 82:9911:50 PM 3.19* 0.61 - 1.24 mg/dL Final  37/16/967811/15/2016 93:8106:40 PM 2.60* 0.61 - 1.24 mg/dL Final  01/75/102511/15/2016 85:2706:11 PM 2.96* 0.61 - 1.24 mg/dL Final  78/24/235310/01/2014 61:4405:37 AM 2.64* 0.61 - 1.24 mg/dL Final  31/54/008609/30/2016 76:1904:20 AM 2.84* 0.61 - 1.24 mg/dL Final  50/93/267109/29/2016 24:5804:55 AM 3.20* 0.61 - 1.24 mg/dL Final  09/98/338209/28/2016 50:5311:00 AM 3.04* 0.61 - 1.24 mg/dL Final  97/67/341909/27/2016 37:9004:50 AM 3.34* 0.61 - 1.24 mg/dL Final  24/09/735309/26/2016 29:9203:52 AM 3.85* 0.61 - 1.24 mg/dL Final  42/68/341909/25/2016 62:2205:12 AM 4.43* 0.61 - 1.24 mg/dL Final  97/98/921109/24/2016 94:1705:00 AM 5.27* 0.61 - 1.24 mg/dL Final  40/81/448109/23/2016 85:6305:10 AM 6.38* 0.61 - 1.24 mg/dL Final  14/97/026309/22/2016 78:5801:47 PM 6.41* 0.61 - 1.24 mg/dL Final  85/02/774109/21/2016 28:7805:30 AM 5.90* 0.61 - 1.24 mg/dL Final    Comment:    DELTA CHECK NOTED REPEATED TO VERIFY   09/22/2014 04:05 AM 3.24* 0.61 - 1.24 mg/dL Final    Comment:    DELTA CHECK NOTED REPEATED TO VERIFY   09/21/2014 10:35 AM 2.15* 0.61 - 1.24 mg/dL Final  67/67/209408/03/2014 70:9601:20 AM 1.48* 0.61 - 1.24 mg/dL Final  28/36/629404/04/2014 76:5404:30 AM 0.95 0.50 - 1.35 mg/dL Final  65/03/546504/03/2014 68:1210:27 AM 0.99 0.50 - 1.35 mg/dL Final  75/17/001703/25/2016 49:4409:55 AM 1.19 0.50 - 1.35 mg/dL Final  96/75/916303/24/2016 84:6605:45 AM 1.10 0.50 - 1.35 mg/dL Final  59/93/570103/23/2016 77:9301:50 PM 1.05 0.50 - 1.35 mg/dL Final  90/30/092303/23/2016 30:0702:06 AM 1.49* 0.50 - 1.35 mg/dL Final  62/26/333503/22/2016 45:6204:22 PM 1.63* 0.50 - 1.35 mg/dL Final  56/38/937303/22/2016 42:8701:36 PM 1.80* 0.50 - 1.35 mg/dL Final  68/11/572605/21/2015 20:3510:33 AM 0.90 0.50 - 1.35 mg/dL Final  59/74/163803/25/2015 45:3610:40 PM 0.84 0.50 - 1.35 mg/dL Final     PMHx:   Past Medical History  Diagnosis Date  . Thyroid disease   . Type 2 diabetes mellitus with diabetic nephropathy (HCC)   . Hypertension   . Nephrotic syndrome 05/18/2014  . DVT (deep venous thrombosis), H/o 01/2014-on Xarelto 03/24/2014  . Secondary DM with DKA-AG=16, BIcarb Nl 03/24/2014  . CKD stage 3 due to type 2 diabetes mellitus [facility](HCC)      Past Surgical History  Procedure Laterality Date  . Right ankle fx repair    . Eye surgery    . Esophagogastroduodenoscopy N/A 03/29/2014    Procedure: ESOPHAGOGASTRODUODENOSCOPY (EGD);  Surgeon: Dorena CookeyJohn Hayes, MD;  Location: [facility];  Service: Endoscopy;  Laterality: N/A;  . Tonsillectomy and adenoidectomy      Family Hx:  Family History  Problem Relation Age of Onset  . Obesity Mother     Patient states that family members have no other medical illnesses other than what I have  described  . Heart disease      No family history  . Cancer Father 50    AML  . Kidney cancer Maternal Grandmother     Social History:  reports that he has never smoked. He has never used smokeless tobacco. He reports that he does not drink alcohol or use illicit drugs.  Allergies:  Allergies  Allergen Reactions  . Ibuprofen Other (See Comments)    MD told him not to take due to kidney disease.    Medications: Prior to Admission medications   Medication Sig Start Date End Date Taking? Authorizing Provider  albuterol (PROVENTIL HFA;VENTOLIN HFA) 108 (90 BASE) MCG/ACT inhaler Inhale 1 puff into the lungs every 6 (six) hours as needed for wheezing or shortness of breath.   Yes Historical Provider, MD  albuterol (PROVENTIL) (2.5 MG/3ML) 0.083% nebulizer solution Take 3 mLs (2.5 mg total) by nebulization every 6 (six) hours as needed for wheezing or shortness of breath. 09/03/14  Yes [name], MD  albuterol-ipratropium (COMBIVENT) 18-103 MCG/ACT inhaler Inhale 2 puffs into the lungs every 4 (four) hours. Patient taking differently: Inhale 2 puffs into the lungs every 4 (four) hours. Take as needed 09/03/14  Yes [name], MD  ALPRAZolam Prudy Feeler) 0.5 MG tablet TAKE 1 TABLET TWICE DAILY AS NEEDED FOR SLEEP Patient taking differently: Take 0.5 mg by mouth at bedtime as needed for sleep.  10/12/14  Yes [name], MD  amLODipine (NORVASC) 10 MG tablet Take 1 tablet (10 mg total) by mouth  daily. 11/21/14  Yes Campbell Stall, MD  citalopram (CELEXA) 40 MG tablet Take 40 mg by mouth at bedtime. 12/21/14  Yes Historical Provider, MD  clonazePAM (KLONOPIN) 1 MG tablet Take 1 mg by mouth at bedtime as needed (sleep).    Yes Historical Provider, MD  cloNIDine (CATAPRES) 0.1 MG tablet Take 0.1 mg by mouth 3 (three) times daily.   Yes Historical Provider, MD  cyclobenzaprine (FLEXERIL) 10 MG tablet Take 1 tablet (10 mg total) by mouth at bedtime. 10/12/14  Yes [name], MD  furosemide (LASIX) 80 MG tablet Take 1 tablet (80 mg total) by mouth 2 (two) times daily. Patient taking differently: Take 160 mg by mouth 2 (two) times daily.  11/20/14  Yes Campbell Stall, MD  gabapentin (NEURONTIN) 100 MG capsule Take 1 capsule (100 mg total) by mouth at bedtime. Patient taking differently: Take 400 mg by mouth at bedtime.  09/29/14  Yes Albertine Grates, MD  hydrALAZINE (APRESOLINE) 10 MG tablet Take 1 tablet (10 mg total) by mouth 3 (three) times daily. 11/21/14  Yes Campbell Stall, MD  HYDROmorphone (DILAUDID) 2 MG tablet Take 1 tablet (2 mg total) by mouth every 6 (six) hours as needed for severe pain. 11/24/14  Yes [name], MD  insulin lispro (HUMALOG) 100 UNIT/ML injection Inject 0-15 Units into the skin 3 (three) times daily before meals. Carb coverage   Yes Historical Provider, MD  LANTUS SOLOSTAR 100 UNIT/ML Solostar Pen Inject 20 Units into the skin daily. Patient taking differently: Inject 30 Units into the skin daily.  11/20/14  Yes Campbell Stall, MD  levothyroxine (SYNTHROID, LEVOTHROID) 50 MCG tablet Take 1 tablet (50 mcg total) by mouth daily. 05/02/13  Yes [name], MD  metoprolol succinate (TOPROL-XL) 100 MG 24 hr tablet Take 1 tablet (100 mg total) by mouth daily. 05/20/14  Yes Lewayne Bunting, MD  pantoprazole (PROTONIX) 40 MG tablet Take 40 mg by mouth daily.   Yes  Historical Provider, MD  promethazine (PHENERGAN) 25 MG tablet Take 1 tablet (25 mg total) by mouth every 6  (six) hours as needed for nausea. 11/21/14  Yes Campbell Stall, MD  sucralfate (CARAFATE) 1 GM/10ML suspension Take 1 g by mouth 4 (four) times daily -  with meals and at bedtime.   Yes Historical Provider, MD  tobramycin (TOBREX) 0.3 % ophthalmic solution Place 1 drop into both eyes every 4 (four) hours. 11/24/14  Yes [name], MD  acetaminophen (TYLENOL) 500 MG tablet Take 1 tablet (500 mg total) by mouth every 6 (six) hours as needed for moderate pain. 09/29/14   Albertine Grates, MD  citalopram (CELEXA) 20 MG tablet Take 1 tablet (20 mg total) by mouth daily. Patient not taking: Reported on 12/30/2014 10/12/14   [name], MD  metoCLOPramide (REGLAN) 5 MG tablet Take 1 tablet (5 mg total) by mouth 4 (four) times daily. Patient not taking: Reported on 12/30/2014 10/12/14   [name], MD    I have reviewed the patient's current medications.  Labs: No results found for this or any previous visit (from the past 48 hour(s)).   ROS:  A comprehensive review of systems was negative except for: Eyes: positive for visual disturbance Cardiovascular: positive for dyspnea and lower extremity edema Gastrointestinal: positive for abdominal pain and nausea overall unwell feeling  Physical Exam: Filed Vitals:   12/30/14 1330  BP: 212/109  Pulse: 91  Temp: 98.6 F (37 C)  Resp: 22     General: pale, fairly well appearing WM- frustrated- weight is up 12 pounds HEENT: previous eye surgeries- mucous membranes moist Neck: positive for JVD Heart: RRR Lungs: dec BS at bases Abdomen: distended, non tender Extremities: pitting edema peripherally  Skin: abrasions Neuro: alert, slightly slow  Assessment/Plan: 44 year old with long-standing diabetes as well as nephrotic syndrome who present after failed outpatient management of volume status/blood pressure. He also has progressive chronic kidney disease.  1.Renal- Progressive chronic kidney disease in the setting of nephrotic syndrome for  over a year. Presumed due to diabetic nephropathy but other etiology unable to be ruled out. His progressive chronic kidney disease is making diuresis more difficult. He has failed outpatient management for this. He's heading towards dialysis. There are no acute indication that this time. I've spoken with him and wanted him to get an AV fistula placement in preparation for dialysis and he is agreeable.  We will try to get that done this hospitalization, Vein mapping has been ordered. 2. Hypertension/volume  - Volume overload that's been difficult to manage as an outpatient. Patient has responded in the past and I think that there are some behavioral issues that's not allowing him to take the diuretics appropriately. I would like to see if with IV diuresis we can get him to a much more normal weight so that we continue with intensive management of volume status/blood pressure as an outpatient. He's on a number of blood pressure medications including metoprolol, hydralazine and clonidine. However, I feel that volume has the most to do with his out-of-control blood pressure. He was as a on an ACE inhibitor at one time that was discontinued secondary to acute on chronic renal failure.  Planning to start with Lasix 160 mg Q6 hours and assess progress 3. Anemia  - Had been an issue in the past but most recent hemoglobin is greater than 11. I anticipate it will decrease in the hospital. Will treat as needed 4. Suspicion of other etiology for nephrotic syndrome-  a biopsy was attempted in the past and was not successful. We discussed getting another biopsy but was unable to get him tuned up enough medically to undergo. I had put him on CellCept empirically to see if we could decrease his proteinuria. He could not tolerate that medicine and proteinuria was not affected so that medication will be stopped   Cinque Begley A 12/30/2014, 3:34 PM

## 2014-12-31 ENCOUNTER — Inpatient Hospital Stay (HOSPITAL_COMMUNITY): Payer: Managed Care, Other (non HMO)

## 2014-12-31 DIAGNOSIS — N184 Chronic kidney disease, stage 4 (severe): Secondary | ICD-10-CM

## 2014-12-31 DIAGNOSIS — R0602 Shortness of breath: Secondary | ICD-10-CM

## 2014-12-31 DIAGNOSIS — R2240 Localized swelling, mass and lump, unspecified lower limb: Secondary | ICD-10-CM

## 2014-12-31 LAB — RENAL FUNCTION PANEL
ANION GAP: 8 (ref 5–15)
Albumin: 1.8 g/dL — ABNORMAL LOW (ref 3.5–5.0)
BUN: 48 mg/dL — ABNORMAL HIGH (ref 6–20)
CALCIUM: 7.7 mg/dL — AB (ref 8.9–10.3)
CHLORIDE: 107 mmol/L (ref 101–111)
CO2: 22 mmol/L (ref 22–32)
Creatinine, Ser: 4.13 mg/dL — ABNORMAL HIGH (ref 0.61–1.24)
GFR, EST AFRICAN AMERICAN: 19 mL/min — AB (ref >60)
GFR, EST NON AFRICAN AMERICAN: 16 mL/min — AB (ref >60)
Glucose, Bld: 212 mg/dL — ABNORMAL HIGH (ref 65–99)
Phosphorus: 6.5 mg/dL — ABNORMAL HIGH (ref 2.5–4.6)
Potassium: 3.8 mmol/L (ref 3.5–5.1)
Sodium: 137 mmol/L (ref 135–145)

## 2014-12-31 LAB — HEMOGLOBIN A1C
Hgb A1c MFr Bld: 8.5 % — ABNORMAL HIGH (ref 4.8–5.6)
Mean Plasma Glucose: 197 mg/dL

## 2014-12-31 LAB — GLUCOSE, CAPILLARY
GLUCOSE-CAPILLARY: 205 mg/dL — AB (ref 65–99)
GLUCOSE-CAPILLARY: 135 mg/dL — AB (ref 65–99)
GLUCOSE-CAPILLARY: 241 mg/dL — AB (ref 65–99)
Glucose-Capillary: 135 mg/dL — ABNORMAL HIGH (ref 65–99)

## 2014-12-31 MED ORDER — MAGNESIUM SULFATE 2 GM/50ML IV SOLN
2.0000 g | Freq: Once | INTRAVENOUS | Status: AC
  Administered 2014-12-31: 2 g via INTRAVENOUS
  Filled 2014-12-31: qty 50

## 2014-12-31 MED ORDER — DEXTROSE 5 % IV SOLN
1.5000 g | INTRAVENOUS | Status: AC
Start: 2015-01-01 — End: 2015-01-01
  Administered 2015-01-01: 1.5 g via INTRAVENOUS
  Filled 2014-12-31 (×2): qty 1.5

## 2014-12-31 MED ORDER — CALCIUM ACETATE (PHOS BINDER) 667 MG PO CAPS
667.0000 mg | ORAL_CAPSULE | Freq: Three times a day (TID) | ORAL | Status: DC
  Administered 2014-12-31 – 2015-01-01 (×3): 667 mg via ORAL
  Filled 2014-12-31 (×2): qty 1

## 2014-12-31 MED ORDER — INSULIN GLARGINE 100 UNIT/ML ~~LOC~~ SOLN
30.0000 [IU] | Freq: Every day | SUBCUTANEOUS | Status: DC
  Administered 2015-01-01 – 2015-01-02 (×2): 30 [IU] via SUBCUTANEOUS
  Filled 2014-12-31 (×2): qty 0.3

## 2014-12-31 MED ORDER — HYDRALAZINE HCL 25 MG PO TABS
25.0000 mg | ORAL_TABLET | Freq: Three times a day (TID) | ORAL | Status: DC
Start: 2014-12-31 — End: 2015-01-01
  Administered 2014-12-31 – 2015-01-01 (×4): 25 mg via ORAL
  Filled 2014-12-31 (×4): qty 1

## 2014-12-31 NOTE — Progress Notes (Signed)
[facility] PROGRESS NOTE  Russell [name]:[id number] DOB: [birth year] DOA: 12/30/2014 PCP: [name], MD  Brief Summary  The patient is a 44 y.o. 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 He has had progressive kidney disease with 4 hospitalizations in the last 6 months for uncontrolled diabetes, nausea, vomiting, hypertensive urgency, and AKI.   Assessment/Plan  Acute on chronic renal failure reported by his nephrologist, creatinine from 12/27 was 4.1. Dry weight probably near 250-lbs, currently 273-lbs.  - patient reportedly had a renal vein clotting on renal artery [place] at [facility], however, renal artery duplex done on 9/21 demonstrated no renal artery stenosis, patent bilateral renal veins -  Continue IV lasix  IV q6h - Creatinine approximately stable - Renal diet - Wt trending down and 1.7 L negative  Hypertensive urgency, BP trending down after resuming BP medications and may continue to trend down with diuresis - troponin negative - continue Metoprolol 100 mg PO ER  -  Increase Hydralazine 25 mg PO TID  - clonidine 0.1mg  TID - No ACEI or ARB - Diuresis as above  Mixed restrictive/obstructive pulmonary defect (per outpatient PFT done at [facility]). Suspect wheezing is due to hypervolumia - CXR without edema - stable, albuterol as needed  Uncontrolled diabetes mellitus type 2 with peripheral artery disease, diabetic retinopathy, and neuropathy -  resume lantus to 30 units  - Schedule aspart 6 units with meals with low dose SSI - Repeat A1c, last was 10.3 on 09/21/2014 - May need to cancel his eye appointment on 01/08/15  Obesity, needs weight loss  History of superficial venous thrombosis, H/o 01/2014  (no evident of DVT) - Xarelto discontinued due to worsening of renal function - Hematology Dr. Merlene Pulling who saw patient in 06/2014 for anticoagulation issues who discontinued anticoagulation since hypercoagulable panel was negative and the patient had been more active  Diabetic neuropathy - continue Gabapentin  Hypothyroidism (acquired) - continue Synthroid   Erosive esophagitis - D/c carafate to minimize aluminum toxicity - Continue protonix  Depression/anxiety, stable, continue celexa. Patient on two benzodiazepines at home. Will continue clonazepam for now and hold xanax.   Normocytic anemia -  Iron studies, B12, folate -  TSH -  Occult stool -  Repeat hgb in AM  Elevated phos, calcium corrects to normal, start phoslo  Diet:  Renal  Access:  PIV IVF:  off Proph:  heparin  Code Status: full Family Communication: Patient alone Disposition Plan: Pending further diuresis, vascular surgery evaluation for fistula   Consultants:  Nephrology  Vascular surgery  Procedures:  None  Antibiotics:  None   HPI/Subjective:  States that he continues to have wheezing, peripheral edema, but is starting to feel a little less fatigued, voiding frequently. Mouth feels dry.    Objective: Filed Vitals:   12/30/14 1718 12/30/14 2125 12/31/14 0442 12/31/14 1000  BP: 198/101 140/84 163/92 181/107  Pulse: 90 87 84 80  Temp:  98.1 F (36.7 C) 97.9 F (36.6 C) 97.5 F (36.4 C)  TempSrc:  Oral Oral Oral  Resp: Height:   (1.88 m)    Weight:  119 kg (262  lb 5.6 oz)    SpO2:  100% 97% 98%    Intake/Output Summary (Last 24 hours) at 12/31/14 1409 Last data filed at 12/31/14 1300  Gross per 24  hour  Intake   1238 ml  Output   4175 ml  Net  -2937 ml   Filed Weights   12/30/14 1330 12/30/14 2125  Weight: 124 kg (273 lb 5.9 oz) 119 kg (262 lb 5.6 oz)   Body mass index is 33.67 kg/(m^2).  Exam:   General:  Obese male, No acute distress  HEENT:  NCAT, MMM, face appears puffy  Cardiovascular:  RRR, nl S1, S2 no mrg, 2+ pulses, warm extremities  Respiratory:  Diminished at bases with wheeze, no rales or rhonchi, no increased WOB  Abdomen:   NABS, soft, NT/ND  MSK:   Normal tone and bulk, 2+ pitting bilateral LEE  Data Reviewed: Basic Metabolic Panel:  Recent Labs Lab 12/30/14 1548 12/31/14 0615  NA 138 137  K 4.6 3.8  CL 108 107  CO2 21* 22  GLUCOSE 348* 212*  BUN 47* 48*  CREATININE 4.06* 4.13*  CALCIUM 7.4* 7.7*  MG 1.5*  --   PHOS 5.4* 6.5*   Liver Function Tests:  Recent Labs Lab 12/30/14 1548 12/31/14 0615  AST 17  --   ALT 16*  --   ALKPHOS 74  --   BILITOT 0.7  --   PROT 4.5*  --   ALBUMIN 1.9* 1.8*   No results for input(s): LIPASE, AMYLASE in the last 168 hours. No results for input(s): AMMONIA in the last 168 hours. CBC:  Recent Labs Lab 12/30/14 1548  WBC 7.5  NEUTROABS 5.4  HGB 10.9*  HCT 32.5*  MCV 84.0  PLT 243    No results found for this or any previous visit (from the past 240 hour(s)).   Studies: Portable Chest 1 View  12/30/2014  CLINICAL DATA:  Wheezing for 3 days. EXAM: PORTABLE CHEST 1 VIEW COMPARISON:  09/24/2014 FINDINGS: Heart is borderline in size. Lungs are clear. No effusions or edema. No acute bony abnormality. IMPRESSION: No active disease. Electronically Signed   By: Charlett NoseKevin  Dover M.D.   On: 12/30/2014 16:09    Scheduled Meds: . amLODipine  10 mg Oral Daily  . citalopram  40 mg Oral Daily  . cloNIDine  0.1 mg Oral TID  . cyclobenzaprine  10 mg Oral QHS  . furosemide  160 mg Intravenous Q6H  . gabapentin  400 mg Oral QHS  . heparin  5,000 Units Subcutaneous 3 times per day  . hydrALAZINE  25 mg  Oral TID  . insulin aspart  0-5 Units Subcutaneous QHS  . insulin aspart  0-9 Units Subcutaneous TID WC  . insulin aspart  6 Units Subcutaneous TID WC  . insulin glargine  25 Units Subcutaneous Daily  . levothyroxine  50 mcg Oral QAC breakfast  . metoprolol succinate  100 mg Oral Daily  . pantoprazole  40 mg Oral Daily  . sodium chloride  3 mL Intravenous Q12H   Continuous Infusions:   Principal Problem:   Acute renal failure superimposed on stage 4 chronic kidney disease (HCC) Active Problems:   Hypothyroidism (acquired)   Diabetic neuropathy (HCC)   Uncontrolled diabetes mellitus type 2 with peripheral artery disease (HCC)   Acute on chronic renal failure (HCC)   Hx of gastroesophageal reflux (GERD)   Prolonged Q-T interval on ECG   AKI (acute kidney injury) (HCC)   Hypertensive urgency  Time spent: 30 min    Tyreshia Ingman, [facility]  [facility] Pager [phone]. If 7PM-7AM, please contact night-coverage at [email or web address], password [facility] 12/31/2014, 2:09 PM  LOS: 1 day

## 2014-12-31 NOTE — Progress Notes (Signed)
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 amLODipine  10 mg Oral Daily  . citalopram  40 mg Oral Daily  . cloNIDine  0.1 mg Oral TID  . cyclobenzaprine  10 mg Oral QHS  . furosemide  160 mg Intravenous Q6H  . gabapentin  400 mg Oral QHS  . heparin  5,000 Units Subcutaneous 3 times per day  . hydrALAZINE  10 mg Oral TID  . insulin aspart  0-5 Units Subcutaneous QHS  . insulin aspart  0-9 Units Subcutaneous TID WC  . insulin aspart  6 Units Subcutaneous TID WC  . insulin glargine  25 Units Subcutaneous Daily  . levothyroxine  50 mcg Oral QAC breakfast  . metoprolol succinate  100 mg Oral Daily  . pantoprazole  40 mg Oral Daily  . sodium chloride  3 mL Intravenous Q12H   Continuous Infusions:  PRN Meds:.acetaminophen, clonazePAM, hydrALAZINE, HYDROmorphone, metoCLOPramide, promethazine  Current Labs: reviewed    Physical Exam:  Blood pressure 181/107, pulse 80, temperature 97.5 F (36.4 C), temperature source Oral, resp. rate 18, height 6\' 2"  (1.88 m), weight 119 kg (262 lb 5.6 oz), SpO2 98 %. NAD RRR, no murmur CTAB, faint crackles in bases 2+ LEE to knees, pitting  A 1. 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 Anasarca / Hypervolemia 3. Anemia, mild, normocytic 4. Hyperphosphatemia 5. HTN on CCB, clonidine, hydralazine 6. DM2 7. Hx/o DVT on NOAC 8. Chronic Headaches 9. In need of permanent access  P 1. Cont diuretics, tolerating well so far 2. Pt declined dietitian to see for Na education 3. Will consult [facility] for AVF eval, vein mapping complete 4. Follow phos,  might need to add binder 5. Increase hydralazine Daily weights, Daily Renal Panel, Strict I/Os, Avoid nephrotoxins (NSAIDs, judicious IV Contrast)   Sabra Heckyan Rutilio Yellowhair MD 12/31/2014, 1:45 PM   Recent Labs Lab 12/30/14 1548 12/31/14 0615  NA 138 137  K 4.6 3.8  CL 108 107  CO2 21* 22  GLUCOSE 348* 212*  BUN 47* 48*  CREATININE 4.06* 4.13*  CALCIUM 7.4* 7.7*  PHOS 5.4* 6.5*    Recent Labs Lab 12/30/14 1548  WBC 7.5  NEUTROABS 5.4  HGB 10.9*  HCT 32.5*  MCV 84.0  PLT 243

## 2014-12-31 NOTE — Consult Note (Signed)
[facility]ist [facility]  Patient name: Russell [name] MRN: [id number] DOB: 01-06-1970 Sex: male  REASON FOR CONSULT: permanent dialysis access; consult is from Dr. Marisue Humble  HPI: Russell [name] is a 44 y.o. male with CKD stage IV,  who presents with worsening shortness of breath and leg swelling. He is not yet on hemodialysis. The patient is right-handed. He has never had access procedures before. He admits to having nausea and vomiting prior to admission.  He has had multiple hospitalizations in the last 6 months for poorly controlled diabetes, hypertensive urgency and acute kidney injury   The patient has a past medical history of uncontrolled diabetes mellitus type 2 on insulin, poorly controlled hypertension, mixed restrictive and obstructive lung disease, and obesity.   Past Medical History  Diagnosis Date  . Hypertension   . DVT (deep venous thrombosis), H/o 01/2014-on Xarelto 03/24/2014    LLE  . Secondary DM with DKA-AG=16, BIcarb Nl 03/24/2014  . Age-related macular degeneration, wet, both eyes (HCC)     "I'm getting Alia treatments" (12/30/2014)  . Hypothyroidism   . Asthma   . Pneumonia 11/2013  . Daily headache   . Arthritis     "hands" (12/30/2014)  . Anxiety   . Depression   . Type 2 diabetes mellitus with diabetic nephropathy (HCC)   . Nephrotic syndrome 05/18/2014  . 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 Obesity Mother     Patient states that family members have no other medical illnesses other than what I have described  . Heart disease      No family history  . Cancer Father 50    AML  . Kidney cancer Maternal Grandmother     SOCIAL HISTORY: Social History   Social History  . Marital Status: Significant Other    Spouse Name: N/A  . Number of Children: N/A  . Years of Education: N/A   Occupational History  .      Maintenance Curator   Social History Main Topics    . Smoking status: Never Smoker   . Smokeless tobacco: Never Used  . Alcohol Use: No  . Drug Use: No  . Sexual Activity: Not Currently   Other Topics Concern  . Not on file   Social History Narrative   Patient currently lives with his fiance   Works with facilities   Nonsmoker, nondrinker       Allergies  Allergen Reactions  . Ibuprofen Other (See Comments)    MD told him not to take due to kidney disease.    Current Facility-Administered Medications  Medication Dose Route Frequency Provider Last Rate Last Dose  . acetaminophen (TYLENOL) tablet 650 mg  650 mg Oral Q6H PRN Renae Fickle, MD   650 mg at 12/31/14 0313  . amLODipine (NORVASC) tablet 10 mg  10 mg Oral Daily Renae Fickle, MD   10 mg at 12/31/14 1031  . calcium acetate (PHOSLO) capsule 667 mg  667 mg Oral TID WC Renae Fickle, MD      . citalopram (CELEXA) tablet 40 mg  40 mg Oral Daily Renae Fickle, MD   40 mg at 12/31/14 1030  . clonazePAM (KLONOPIN) tablet 1 mg  1 mg Oral BID PRN Renae Fickle, MD      . cloNIDine (CATAPRES) tablet 0.1 mg  0.1 mg Oral TID Renae Fickle, MD   0.1 mg  at 12/31/14 1030  . cyclobenzaprine (FLEXERIL) tablet 10 mg  10 mg Oral QHS Renae Fickle, MD   10 mg at 12/30/14 2125  . furosemide (LASIX) 160 mg in dextrose 5 % 50 mL IVPB  160 mg Intravenous Q6H Renae Fickle, MD   160 mg at 12/31/14 1030  . gabapentin (NEURONTIN) capsule 400 mg  400 mg Oral QHS Renae Fickle, MD   400 mg at 12/30/14 2125  . heparin injection 5,000 Units  5,000 Units Subcutaneous 3 times per day Renae Fickle, MD   5,000 Units at 12/30/14 2126  . hydrALAZINE (APRESOLINE) injection 10 mg  10 mg Intravenous Q4H PRN Renae Fickle, MD      . hydrALAZINE (APRESOLINE) tablet 25 mg  25 mg Oral TID Arita Miss, MD      . HYDROmorphone (DILAUDID) tablet 2 mg  2 mg Oral Q6H PRN Renae Fickle, MD   2 mg at 12/31/14 0843  . insulin aspart (novoLOG) injection 0-5 Units  0-5 Units Subcutaneous QHS  Renae Fickle, MD   0 Units at 12/30/14 2204  . insulin aspart (novoLOG) injection 0-9 Units  0-9 Units Subcutaneous TID WC Renae Fickle, MD   1 Units at 12/31/14 1300  . insulin aspart (novoLOG) injection 6 Units  6 Units Subcutaneous TID WC Renae Fickle, MD   6 Units at 12/31/14 1301  . [START ON 01/01/2015] insulin glargine (LANTUS) injection 30 Units  30 Units Subcutaneous Daily Renae Fickle, MD      . levothyroxine (SYNTHROID, LEVOTHROID) tablet 50 mcg  50 mcg Oral QAC breakfast Renae Fickle, MD   50 mcg at 12/31/14 0750  . metoCLOPramide (REGLAN) tablet 5 mg  5 mg Oral Daily PRN Renae Fickle, MD      . metoprolol succinate (TOPROL-XL) 24 hr tablet 100 mg  100 mg Oral Daily Renae Fickle, MD   100 mg at 12/31/14 1030  . pantoprazole (PROTONIX) EC tablet 40 mg  40 mg Oral Daily Renae Fickle, MD   40 mg at 12/31/14 1030  . promethazine (PHENERGAN) tablet 25 mg  25 mg Oral Q6H PRN Renae Fickle, MD      . sodium chloride 0.9 % injection 3 mL  3 mL Intravenous Q12H Renae Fickle, MD   3 mL at 12/31/14 1035    REVIEW OF SYSTEMS:   denotes positive finding,  denotes negative finding Cardiac  Comments:  Chest pain or chest pressure:    Shortness of breath upon exertion: x   Short of breath when lying flat:    Irregular heart rhythm:        Vascular    Pain in calf, thigh, or hip brought on by ambulation:    Pain in feet at night that wakes you up from your sleep:     Blood clot in your veins:    Leg swelling:  x       Pulmonary    Oxygen at home:    Productive cough:     Wheezing:         Neurologic    Sudden weakness in arms or legs:     Sudden numbness in arms or legs:     Sudden onset of difficulty speaking or slurred speech:    Temporary loss of vision in one eye:     Problems with dizziness:         Gastrointestinal    Blood in stool:     Vomited blood:  Genitourinary    Burning when urinating:     Blood in urine:          Psychiatric    Major depression:         Hematologic    Bleeding problems:    Problems with blood clotting too easily:        Skin    Rashes or ulcers:        Constitutional    Fever or chills:      PHYSICAL EXAM: Filed Vitals:   12/30/14 1718 12/30/14 2125 12/31/14 0442 12/31/14 1000  BP: 198/101 140/84 163/92 181/107  Pulse: 90 87 84 80  Temp:  98.1 F (36.7 C) 97.9 F (36.6 C) 97.5 F (36.4 C)  TempSrc:  Oral Oral Oral  Resp: 20 20 18 18   Height:  6\' 2"  (1.88 m)    Weight:  262 lb 5.6 oz (119 kg)    SpO2:  100% 97% 98%    GENERAL: The patient is a well-nourished male, in no acute distress. The vital signs are documented above. CARDIAC: There is a regular rate and rhythm.  VASCULAR: Palpable radial and brachial pulses bilaterally. 2+ pedal edema bilaterally. Feet are warm and pink.  PULMONARY: There is good air exchange bilaterally without wheezing or rales. MUSCULOSKELETAL: There are no major deformities or cyanosis. NEUROLOGIC: No focal weakness or paresthesias are detected. SKIN: There are no ulcers or rashes noted. PSYCHIATRIC: The patient has a normal affect.  DATA:  Vein mapping 12/31/2014  Adequate cephalic vein conduits bilaterally  MEDICAL ISSUES: CKD stage IV, progressive  The patient is not yet on hemodialysis. The patient is right-handed. His vein mapping shows an excellent left cephalic vein. Plan for left arm fistula tomorrow with Dr. Hart RochesterLawson. The procedure, risks, and benefits were discussed with the patient at length. He is willing to proceed. Obtain consent. Nothing by mouth past midnight.  Maris BergerKimberly Trinh, PA-C [facility] [place]     I agree with the above.  I have seen and evaluated the patient.  He needs dialysis access.  He is right handed.  He has an excellent left cephalic vein by duplex.  He is scheduled for left radio-cephalic fistula by Dr. Hart RochesterLawson tomorrow.  I discussed the risk of steal and non-maturity with  him.   Durene CalWells Bern Fare

## 2014-12-31 NOTE — Progress Notes (Signed)
Utilization review completed. Kierra Jezewski, RN, BSN. 

## 2014-12-31 NOTE — Progress Notes (Signed)
VASCULAR LAB PRELIMINARY  PRELIMINARY  PRELIMINARY  PRELIMINARY  Right  Upper Extremity Vein Map    Cephalic  Segment Diameter Depth Comment  1. Axilla 6.5303mm mm   2. Mid upper arm 3.5397mm mm branch  3. Above AC 5.4402mm mm   4. In AC 8.5572mm mm   5. Below AC 3.7742mm mm branch  6. Mid forearm 3.101mm mm branch  7. Wrist 2.692mm mm    mm mm    mm mm    mm mm      Left Upper Extremity Vein Map    Cephalic  Segment Diameter Depth Comment  1. Axilla 5.3496mm mm   2. Mid upper arm 4.3244mm mm branch  3. Above AC 4.6538mm mm   4. In AC 9.2701mm mm   5. Below AC 3.3888mm mm branch  6. Mid forearm 3.1238mm mm branch  7. Wrist 2.4083mm mm    mm mm    mm mm    mm mm       Farrel DemarkJill Eunice, RDMS, RVT  12/31/2014, 11:51 AM

## 2015-01-01 ENCOUNTER — Encounter (HOSPITAL_COMMUNITY)
Admission: AD | Disposition: A | Discharge status: Home / Self Care | Payer: Self-pay | Source: Ambulatory Visit | Attending: Internal Medicine

## 2015-01-01 ENCOUNTER — Inpatient Hospital Stay (HOSPITAL_COMMUNITY): Payer: Managed Care, Other (non HMO) | Admitting: Certified Registered Nurse Anesthetist

## 2015-01-01 ENCOUNTER — Encounter (HOSPITAL_COMMUNITY): Payer: Self-pay | Admitting: Certified Registered Nurse Anesthetist

## 2015-01-01 HISTORY — PX: AV FISTULA PLACEMENT: SHX1204

## 2015-01-01 LAB — IRON AND TIBC
Iron: 56 ug/dL (ref 45–182)
SATURATION RATIOS: 30 % (ref 17.9–39.5)
TIBC: 189 ug/dL — ABNORMAL LOW (ref 250–450)
UIBC: 133 ug/dL

## 2015-01-01 LAB — TSH: TSH: 5.175 u[IU]/mL — ABNORMAL HIGH (ref 0.350–4.500)

## 2015-01-01 LAB — RENAL FUNCTION PANEL
ALBUMIN: 1.9 g/dL — AB (ref 3.5–5.0)
ANION GAP: 9 (ref 5–15)
BUN: 54 mg/dL — ABNORMAL HIGH (ref 6–20)
CHLORIDE: 105 mmol/L (ref 101–111)
CO2: 24 mmol/L (ref 22–32)
Calcium: 8.2 mg/dL — ABNORMAL LOW (ref 8.9–10.3)
Creatinine, Ser: 4.57 mg/dL — ABNORMAL HIGH (ref 0.61–1.24)
GFR calc Af Amer: 17 mL/min — ABNORMAL LOW (ref >60)
GFR calc non Af Amer: 14 mL/min — ABNORMAL LOW (ref >60)
GLUCOSE: 197 mg/dL — AB (ref 65–99)
PHOSPHORUS: 7.3 mg/dL — AB (ref 2.5–4.6)
POTASSIUM: 4 mmol/L (ref 3.5–5.1)
Sodium: 138 mmol/L (ref 135–145)

## 2015-01-01 LAB — CBC
HEMATOCRIT: 32.3 % — AB (ref 39.0–52.0)
HEMOGLOBIN: 11 g/dL — AB (ref 13.0–17.0)
MCH: 29 pg (ref 26.0–34.0)
MCHC: 34.1 g/dL (ref 30.0–36.0)
MCV: 85.2 fL (ref 78.0–100.0)
Platelets: 243 10*3/uL (ref 150–400)
RBC: 3.79 MIL/uL — ABNORMAL LOW (ref 4.22–5.81)
RDW: 13.8 % (ref 11.5–15.5)
WBC: 5.7 10*3/uL (ref 4.0–10.5)

## 2015-01-01 LAB — GLUCOSE, CAPILLARY
GLUCOSE-CAPILLARY: 177 mg/dL — AB (ref 65–99)
GLUCOSE-CAPILLARY: 223 mg/dL — AB (ref 65–99)
GLUCOSE-CAPILLARY: 149 mg/dL — AB (ref 65–99)
GLUCOSE-CAPILLARY: 166 mg/dL — AB (ref 65–99)
Glucose-Capillary: 197 mg/dL — ABNORMAL HIGH (ref 65–99)
Glucose-Capillary: 177 mg/dL — ABNORMAL HIGH (ref 65–99)

## 2015-01-01 LAB — VITAMIN B12: Vitamin B-12: 191 pg/mL (ref 180–914)

## 2015-01-01 LAB — BASIC METABOLIC PANEL
Anion gap: 10 (ref 5–15)
BUN: 55 mg/dL — AB (ref 6–20)
CHLORIDE: 104 mmol/L (ref 101–111)
CO2: 24 mmol/L (ref 22–32)
CREATININE: 4.59 mg/dL — AB (ref 0.61–1.24)
Calcium: 8.2 mg/dL — ABNORMAL LOW (ref 8.9–10.3)
GFR calc Af Amer: 17 mL/min — ABNORMAL LOW (ref >60)
GFR calc non Af Amer: 14 mL/min — ABNORMAL LOW (ref >60)
Glucose, Bld: 196 mg/dL — ABNORMAL HIGH (ref 65–99)
Potassium: 3.9 mmol/L (ref 3.5–5.1)
SODIUM: 138 mmol/L (ref 135–145)

## 2015-01-01 LAB — FOLATE: FOLATE: 9.9 ng/mL (ref >5.9)

## 2015-01-01 LAB — FERRITIN: FERRITIN: 204 ng/mL (ref 24–336)

## 2015-01-01 SURGERY — ARTERIOVENOUS (AV) FISTULA CREATION
Anesthesia: Monitor Anesthesia Care | Site: Arm Lower | Laterality: Left

## 2015-01-01 MED ORDER — LIDOCAINE HCL (CARDIAC) 20 MG/ML IV SOLN
INTRAVENOUS | Status: AC
  Filled 2015-01-01: qty 5

## 2015-01-01 MED ORDER — PROPOFOL 10 MG/ML IV BOLUS
INTRAVENOUS | Status: DC | PRN
  Administered 2015-01-01 (×2): 20 mg via INTRAVENOUS

## 2015-01-01 MED ORDER — PROMETHAZINE HCL 25 MG/ML IJ SOLN: 6.2500 mg | INTRAMUSCULAR | Status: DC | PRN

## 2015-01-01 MED ORDER — 0.9 % SODIUM CHLORIDE (POUR BTL) OPTIME
TOPICAL | Status: DC | PRN
  Administered 2015-01-01: 1000 mL

## 2015-01-01 MED ORDER — LIDOCAINE HCL (PF) 1 % IJ SOLN
INTRAMUSCULAR | Status: AC
  Filled 2015-01-01: qty 30

## 2015-01-01 MED ORDER — SODIUM CHLORIDE 0.9 % IV SOLN
INTRAVENOUS | Status: DC | PRN
  Administered 2015-01-01: 08:00:00 via INTRAVENOUS

## 2015-01-01 MED ORDER — HYDROMORPHONE HCL 1 MG/ML IJ SOLN: 0.2500 mg | INTRAMUSCULAR | Status: DC | PRN

## 2015-01-01 MED ORDER — ONDANSETRON HCL 4 MG/2ML IJ SOLN
INTRAMUSCULAR | Status: AC
  Filled 2015-01-01: qty 2

## 2015-01-01 MED ORDER — FENTANYL CITRATE (PF) 250 MCG/5ML IJ SOLN
INTRAMUSCULAR | Status: AC
  Filled 2015-01-01: qty 5

## 2015-01-01 MED ORDER — EPHEDRINE SULFATE 50 MG/ML IJ SOLN
INTRAMUSCULAR | Status: AC
  Filled 2015-01-01: qty 1

## 2015-01-01 MED ORDER — LIDOCAINE-EPINEPHRINE (PF) 1 %-1:200000 IJ SOLN
INTRAMUSCULAR | Status: DC | PRN
  Administered 2015-01-01: 9 mL via INTRADERMAL

## 2015-01-01 MED ORDER — PHENYLEPHRINE 40 MCG/ML (10ML) SYRINGE FOR IV PUSH (FOR BLOOD PRESSURE SUPPORT)
PREFILLED_SYRINGE | INTRAVENOUS | Status: AC
  Filled 2015-01-01: qty 10

## 2015-01-01 MED ORDER — PROPOFOL 10 MG/ML IV BOLUS
INTRAVENOUS | Status: AC
  Filled 2015-01-01: qty 20

## 2015-01-01 MED ORDER — MIDAZOLAM HCL 2 MG/2ML IJ SOLN
INTRAMUSCULAR | Status: AC
  Filled 2015-01-01: qty 2

## 2015-01-01 MED ORDER — FENTANYL CITRATE (PF) 100 MCG/2ML IJ SOLN
INTRAMUSCULAR | Status: DC | PRN
  Administered 2015-01-01: 50 ug via INTRAVENOUS

## 2015-01-01 MED ORDER — SODIUM CHLORIDE 0.9 % IV SOLN
INTRAVENOUS | Status: DC | PRN
  Administered 2015-01-01: 500 mL

## 2015-01-01 MED ORDER — LACTATED RINGERS IV SOLN: INTRAVENOUS | Status: DC | PRN

## 2015-01-01 MED ORDER — OXYCODONE HCL 5 MG PO TABS
5.0000 mg | ORAL_TABLET | ORAL | Status: DC | PRN
  Administered 2015-01-01 – 2015-01-05 (×10): 5 mg via ORAL
  Filled 2015-01-01 (×10): qty 1

## 2015-01-01 MED ORDER — LIDOCAINE-EPINEPHRINE (PF) 1 %-1:200000 IJ SOLN
INTRAMUSCULAR | Status: AC
  Filled 2015-01-01: qty 30

## 2015-01-01 MED ORDER — OXYCODONE HCL 5 MG PO TABS
5.0000 mg | ORAL_TABLET | Freq: Four times a day (QID) | ORAL | Status: DC | PRN
  Administered 2015-01-01: 5 mg via ORAL
  Filled 2015-01-01: qty 1

## 2015-01-01 MED ORDER — ONDANSETRON HCL 4 MG/2ML IJ SOLN
INTRAMUSCULAR | Status: DC | PRN
  Administered 2015-01-01: 4 mg via INTRAVENOUS

## 2015-01-01 MED ORDER — FUROSEMIDE 80 MG PO TABS
160.0000 mg | ORAL_TABLET | Freq: Three times a day (TID) | ORAL | Status: DC
  Administered 2015-01-01 – 2015-01-02 (×3): 160 mg via ORAL
  Filled 2015-01-01 (×3): qty 2

## 2015-01-01 MED ORDER — PROPOFOL 500 MG/50ML IV EMUL
INTRAVENOUS | Status: DC | PRN
  Administered 2015-01-01: 50 ug/kg/min via INTRAVENOUS

## 2015-01-01 SURGICAL SUPPLY — 31 items
ARMBAND PINK RESTRICT EXTREMIT (MISCELLANEOUS) ×3 IMPLANT
CANISTER SUCTION 2500CC (MISCELLANEOUS) ×3 IMPLANT
CLIP TI MEDIUM 6 (CLIP) ×3 IMPLANT
CLIP TI WIDE RED SMALL 6 (CLIP) ×3 IMPLANT
COVER PROBE W GEL 5X96 (DRAPES) ×2 IMPLANT
DRAIN PENROSE 1/4X12 LTX STRL (WOUND CARE) ×3 IMPLANT
ELECT REM PT RETURN 9FT ADLT (ELECTROSURGICAL) ×3
ELECTRODE REM PT RTRN 9FT ADLT (ELECTROSURGICAL) ×1 IMPLANT
GEL ULTRASOUND 20GR AQUASONIC (MISCELLANEOUS) IMPLANT
GLOVE BIO SURGEON STRL SZ 6.5 (GLOVE) ×2 IMPLANT
GLOVE BIO SURGEONS STRL SZ 6.5 (GLOVE) ×2
GLOVE BIOGEL PI IND STRL 6.5 (GLOVE) IMPLANT
GLOVE BIOGEL PI INDICATOR 6.5 (GLOVE) ×4
GLOVE ECLIPSE 7.5 STRL STRAW (GLOVE) ×2 IMPLANT
GLOVE SS BIOGEL STRL SZ 7 (GLOVE) ×1 IMPLANT
GLOVE SUPERSENSE BIOGEL SZ 7 (GLOVE) ×2
GOWN STRL REUS W/ TWL LRG LVL3 (GOWN DISPOSABLE) ×3 IMPLANT
GOWN STRL REUS W/TWL LRG LVL3 (GOWN DISPOSABLE) ×6
GOWN STRL REUS W/TWL XL LVL3 (GOWN DISPOSABLE) ×2 IMPLANT
KIT BASIN OR (CUSTOM PROCEDURE TRAY) ×3 IMPLANT
KIT ROOM TURNOVER OR (KITS) ×3 IMPLANT
LIQUID BAND (GAUZE/BANDAGES/DRESSINGS) ×3 IMPLANT
NS IRRIG 1000ML POUR BTL (IV SOLUTION) ×3 IMPLANT
PACK CV ACCESS (CUSTOM PROCEDURE TRAY) ×3 IMPLANT
PAD ARMBOARD 7.5X6 YLW CONV (MISCELLANEOUS) ×6 IMPLANT
SUT PROLENE 6 0 BV (SUTURE) ×3 IMPLANT
SUT PROLENE 7 0 BV 1 (SUTURE) ×2 IMPLANT
SUT VIC AB 3-0 SH 27 (SUTURE) ×3
SUT VIC AB 3-0 SH 27X BRD (SUTURE) ×1 IMPLANT
UNDERPAD 30X30 INCONTINENT (UNDERPADS AND DIAPERS) ×3 IMPLANT
WATER STERILE IRR 1000ML POUR (IV SOLUTION) ×1 IMPLANT

## 2015-01-01 NOTE — Discharge Instructions (Signed)
° ° °  01/01/2015 Russell [name] [id number] [birth year]  Surgeon(s): Pryor OchoaJames D Lawson, MD  Procedure(s): CREATION OF LEFT RADIAL CEPHALIC ARTERIOVENOUS (AV) FISTULA   x Do not stick fistula for 12 weeks

## 2015-01-01 NOTE — Interval H&P Note (Signed)
History and Physical Interval Note:  01/01/2015 7:31 AM  Russell [name]  has presented today for surgery, with the diagnosis of Stage IV Chronic Kidney Disease N18.4  The various methods of treatment have been discussed with the patient and family. After consideration of risks, benefits and other options for treatment, the patient has consented to  Procedure(s): ARTERIOVENOUS (AV) FISTULA CREATION (Left) as a surgical intervention .  The patient's history has been reviewed, patient examined, no change in status, stable for surgery.  I have reviewed the patient's chart and labs.  Questions were answered to the patient's satisfaction.     [name], Gaylene Moylan

## 2015-01-01 NOTE — Progress Notes (Signed)
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 amLODipine  10 mg Oral Daily  . calcium acetate  667 mg Oral TID WC  . citalopram  40 mg Oral Daily  . cloNIDine  0.1 mg Oral TID  . cyclobenzaprine  10 mg Oral QHS  . furosemide  160 mg Intravenous Q6H  . gabapentin  400 mg Oral QHS  . heparin  5,000 Units Subcutaneous 3 times per day  . hydrALAZINE  25 mg Oral TID  . insulin aspart  0-5 Units Subcutaneous QHS  . insulin aspart  0-9 Units Subcutaneous TID WC  . insulin aspart  6 Units Subcutaneous TID WC  . insulin glargine  30 Units Subcutaneous Daily  . levothyroxine  50 mcg Oral QAC breakfast  . metoprolol succinate  100 mg Oral Daily  . pantoprazole  40 mg Oral Daily  . sodium chloride  3 mL Intravenous Q12H   Continuous Infusions:  PRN Meds:.acetaminophen, clonazePAM, hydrALAZINE, HYDROmorphone, metoCLOPramide, oxyCODONE, promethazine  Current Labs: reviewed    Physical Exam:  Blood pressure 131/73, pulse 71, temperature 97.6 F (36.4 C), temperature source Oral, resp. rate 13, height 6\' 2"  (1.88 m), weight 114.261 kg (251 lb 14.4 oz), SpO2 97 %. NAD RRR, no murmur CTAB, faint crackles in bases 2+ LEE to knees, pitting  A 1. 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 Anasarca / Hypervolemia 3. Hypoalbuminemia 4. Anemia, mild, normocytic 5. Hyperphosphatemia 6. HTN on CCB, clonidine, hydralazine; stable currenlty 7. DM2 with recent A1c 8.5% 8. Hx/o DVT 9. Chronic Headaches 10. S/p L RC AVF 01/01/2015  P 1. Change  over to PO diuretics -- Lasix 160 PO TID 2. If cont to diurese well and labs stable can consider  DC tomorrow 3. Follow phos, might need to add binder 4. Daily weights, Daily Renal Panel, Strict I/Os, Avoid nephrotoxins (NSAIDs, judicious IV Contrast)   Sabra Heckyan Lenny Bouchillon MD 01/01/2015, 10:50 AM   Recent Labs Lab 12/30/14 1548 12/31/14 0615  NA 138 137  K 4.6 3.8  CL 108 107  CO2 21* 22  GLUCOSE 348* 212*  BUN 47* 48*  CREATININE 4.06* 4.13*  CALCIUM 7.4* 7.7*  PHOS 5.4* 6.5*    Recent Labs Lab 12/30/14 1548  WBC 7.5  NEUTROABS 5.4  HGB 10.9*  HCT 32.5*  MCV 84.0  PLT 243

## 2015-01-01 NOTE — H&P (View-Only) (Signed)
 [facility] Specialist of [place]  Patient name: Russell [name] MRN: [id number] DOB: 06/23/1970 Sex: male  REASON FOR CONSULT: permanent dialysis access; consult is from Dr. Sanford  HPI: Russell [name] is a 44 y.o. male with CKD stage IV,  who presents with worsening shortness of breath and leg swelling. He is not yet on hemodialysis. The patient is right-handed. He has never had access procedures before. He admits to having nausea and vomiting prior to admission.  He has had multiple hospitalizations in the last 6 months for poorly controlled diabetes, hypertensive urgency and acute kidney injury   The patient has a past medical history of uncontrolled diabetes mellitus type 2 on insulin, poorly controlled hypertension, mixed restrictive and obstructive lung disease, and obesity.   Past Medical History  Diagnosis Date  . Hypertension   . DVT (deep venous thrombosis), H/o 01/2014-on Xarelto 03/24/2014    LLE  . Secondary DM with DKA-AG=16, BIcarb Nl 03/24/2014  . Age-related macular degeneration, wet, both eyes (HCC)     "I'm getting Alia treatments" (12/30/2014)  . Hypothyroidism   . Asthma   . Pneumonia 11/2013  . Daily headache   . Arthritis     "hands" (12/30/2014)  . Anxiety   . Depression   . Type 2 diabetes mellitus with diabetic nephropathy (HCC)   . Nephrotic syndrome 05/18/2014  . 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 Obesity Mother     Patient states that family members have no other medical illnesses other than what I have described  . Heart disease      No family history  . Cancer Father 50    AML  . Kidney cancer Maternal Grandmother     SOCIAL HISTORY: Social History   Social History  . Marital Status: Significant Other    Spouse Name: N/A  . Number of Children: N/A  . Years of Education: N/A   Occupational History  .      Maintenance mechanic   Social History Main Topics    . Smoking status: Never Smoker   . Smokeless tobacco: Never Used  . Alcohol Use: No  . Drug Use: No  . Sexual Activity: Not Currently   Other Topics Concern  . Not on file   Social History Narrative   Patient currently lives with his fiance   Works with facilities   Nonsmoker, nondrinker       Allergies  Allergen Reactions  . Ibuprofen Other (See Comments)    MD told him not to take due to kidney disease.    Current Facility-Administered Medications  Medication Dose Route Frequency Provider Last Rate Last Dose  . acetaminophen (TYLENOL) tablet 650 mg  650 mg Oral Q6H PRN Mackenzie Short, MD   650 mg at 12/31/14 0313  . amLODipine (NORVASC) tablet 10 mg  10 mg Oral Daily Mackenzie Short, MD   10 mg at 12/31/14 1031  . calcium acetate (PHOSLO) capsule 667 mg  667 mg Oral TID WC Mackenzie Short, MD      . citalopram (CELEXA) tablet 40 mg  40 mg Oral Daily Mackenzie Short, MD   40 mg at 12/31/14 1030  . clonazePAM (KLONOPIN) tablet 1 mg  1 mg Oral BID PRN Mackenzie Short, MD      . cloNIDine (CATAPRES) tablet 0.1 mg  0.1 mg Oral TID Mackenzie Short, MD   0.1 mg   at 12/31/14 1030  . cyclobenzaprine (FLEXERIL) tablet 10 mg  10 mg Oral QHS Renae Fickle, MD   10 mg at 12/30/14 2125  . furosemide (LASIX) 160 mg in dextrose 5 % 50 mL IVPB  160 mg Intravenous Q6H Renae Fickle, MD   160 mg at 12/31/14 1030  . gabapentin (NEURONTIN) capsule 400 mg  400 mg Oral QHS Renae Fickle, MD   400 mg at 12/30/14 2125  . heparin injection 5,000 Units  5,000 Units Subcutaneous 3 times per day Renae Fickle, MD   5,000 Units at 12/30/14 2126  . hydrALAZINE (APRESOLINE) injection 10 mg  10 mg Intravenous Q4H PRN Renae Fickle, MD      . hydrALAZINE (APRESOLINE) tablet 25 mg  25 mg Oral TID Arita Miss, MD      . HYDROmorphone (DILAUDID) tablet 2 mg  2 mg Oral Q6H PRN Renae Fickle, MD   2 mg at 12/31/14 0843  . insulin aspart (novoLOG) injection 0-5 Units  0-5 Units Subcutaneous QHS  Renae Fickle, MD   0 Units at 12/30/14 2204  . insulin aspart (novoLOG) injection 0-9 Units  0-9 Units Subcutaneous TID WC Renae Fickle, MD   1 Units at 12/31/14 1300  . insulin aspart (novoLOG) injection 6 Units  6 Units Subcutaneous TID WC Renae Fickle, MD   6 Units at 12/31/14 1301  . [START ON 01/01/2015] insulin glargine (LANTUS) injection 30 Units  30 Units Subcutaneous Daily Renae Fickle, MD      . levothyroxine (SYNTHROID, LEVOTHROID) tablet 50 mcg  50 mcg Oral QAC breakfast Renae Fickle, MD   50 mcg at 12/31/14 0750  . metoCLOPramide (REGLAN) tablet 5 mg  5 mg Oral Daily PRN Renae Fickle, MD      . metoprolol succinate (TOPROL-XL) 24 hr tablet 100 mg  100 mg Oral Daily Renae Fickle, MD   100 mg at 12/31/14 1030  . pantoprazole (PROTONIX) EC tablet 40 mg  40 mg Oral Daily Renae Fickle, MD   40 mg at 12/31/14 1030  . promethazine (PHENERGAN) tablet 25 mg  25 mg Oral Q6H PRN Renae Fickle, MD      . sodium chloride 0.9 % injection 3 mL  3 mL Intravenous Q12H Renae Fickle, MD   3 mL at 12/31/14 1035    REVIEW OF SYSTEMS:   denotes positive finding,  denotes negative finding Cardiac  Comments:  Chest pain or chest pressure:    Shortness of breath upon exertion: x   Short of breath when lying flat:    Irregular heart rhythm:        Vascular    Pain in calf, thigh, or hip brought on by ambulation:    Pain in feet at night that wakes you up from your sleep:     Blood clot in your veins:    Leg swelling:  x       Pulmonary    Oxygen at home:    Productive cough:     Wheezing:         Neurologic    Sudden weakness in arms or legs:     Sudden numbness in arms or legs:     Sudden onset of difficulty speaking or slurred speech:    Temporary loss of vision in one eye:     Problems with dizziness:         Gastrointestinal    Blood in stool:     Vomited blood:  Genitourinary    Burning when urinating:     Blood in urine:          Psychiatric    Major depression:         Hematologic    Bleeding problems:    Problems with blood clotting too easily:        Skin    Rashes or ulcers:        Constitutional    Fever or chills:      PHYSICAL EXAM: Filed Vitals:   12/30/14 1718 12/30/14 2125 12/31/14 0442 12/31/14 1000  BP: 198/101 140/84 163/92 181/107  Pulse: 90 87 84 80  Temp:  98.1 F (36.7 C) 97.9 F (36.6 C) 97.5 F (36.4 C)  TempSrc:  Oral Oral Oral  Resp: 20 20 18 18   Height:  6\' 2"  (1.88 m)    Weight:  262 lb 5.6 oz (119 kg)    SpO2:  100% 97% 98%    GENERAL: The patient is a well-nourished male, in no acute distress. The vital signs are documented above. CARDIAC: There is a regular rate and rhythm.  VASCULAR: Palpable radial and brachial pulses bilaterally. 2+ pedal edema bilaterally. Feet are warm and pink.  PULMONARY: There is good air exchange bilaterally without wheezing or rales. MUSCULOSKELETAL: There are no major deformities or cyanosis. NEUROLOGIC: No focal weakness or paresthesias are detected. SKIN: There are no ulcers or rashes noted. PSYCHIATRIC: The patient has a normal affect.  DATA:  Vein mapping 12/31/2014  Adequate cephalic vein conduits bilaterally  MEDICAL ISSUES: CKD stage IV, progressive  The patient is not yet on hemodialysis. The patient is right-handed. His vein mapping shows an excellent left cephalic vein. Plan for left arm fistula tomorrow with Dr. Hart RochesterLawson. The procedure, risks, and benefits were discussed with the patient at length. He is willing to proceed. Obtain consent. Nothing by mouth past midnight.  Maris BergerKimberly Trinh, PA-C [facility] [place]     I agree with the above.  I have seen and evaluated the patient.  He needs dialysis access.  He is right handed.  He has an excellent left cephalic vein by duplex.  He is scheduled for left radio-cephalic fistula by Dr. Hart RochesterLawson tomorrow.  I discussed the risk of steal and non-maturity with  him.   Durene CalWells Erlinda Solinger

## 2015-01-01 NOTE — Progress Notes (Addendum)
[facility] PROGRESS NOTE  Russell [name] NWG:[id number] DOB: 1970-03-29 DOA: 12/30/2014 PCP: [name], MD  Brief Summary  The patient is a 44 y.o. 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 He has had progressive kidney disease with 4 hospitalizations in the last 6 months for uncontrolled diabetes, nausea, vomiting, hypertensive urgency, and AKI.   Assessment/Plan  Progressive renal failure, creatinine continues to trend up.  Dry weight probably near 250-lbs, was 273-lbs at admission - patient reportedly had a renal vein clotting on renal artery [place] at [facility], however, renal artery duplex done on 9/21 demonstrated no renal artery stenosis, patent bilateral renal veins -  Change to lasix  PO TID and if weight continues to decrease and neg negative, may consider discharge tomorrow - Renal diet - Wt trending down and 2.3 L negative  Hypertensive urgency, BP trending down much better with clonidine.  Hydralazine did not help much so will d/c - troponin negative -  continue Metoprolol 100 mg PO ER  -  Decrease Hydralazine to 10 mg PO TID  -  Clonidine 0.1mg  TID - No ACEI or ARB - Diuresis as above  Mixed restrictive/obstructive pulmonary defect (per outpatient PFT done at [facility]). Suspect wheezing is due to hypervolumia - CXR without edema - stable, albuterol as needed  Uncontrolled diabetes mellitus type 2 with peripheral artery disease, diabetic  retinopathy, and neuropathy - continue lantus to 30 units (first dose was this morning) - continue aspart 6 units with meals with low dose SSI - Repeat A1c, last was 10.3 on 09/21/2014 - will be able to keep his eye appointment on 01/08/15  Obesity, needs weight loss  History of superficial venous thrombosis, H/o 01/2014  (no evident of DVT) - Xarelto discontinued due to worsening of renal function - Hematology Dr. Merlene Pulling who saw patient in 06/2014 for anticoagulation issues who discontinued anticoagulation since hypercoagulable panel was negative and the patient had been more active  Diabetic neuropathy - continue Gabapentin  Hypothyroidism (acquired) - continue Synthroid   Erosive esophagitis - D/c'd carafate to minimize aluminum toxicity - Continue protonix  Depression/anxiety, stable, continue celexa. Patient on two benzodiazepines at home. Will continue clonazepam for now and hold xanax.   NSVT, stable -  Keep electrolytes wnl -  ECHO from earlier this year without valvular dysfunction.  Preserved EF -  D/c telemetry  Anemia or renal disease.  Iron, b12, folate wnl.  TSH is mildly elevated in setting of acute illness but not likely responsible for anemia  Hypothryoidism, question of medication compliance and also checked in setting of acute illness -  Will continue current dose and have MD repeat TSH in 2-4 weeks  Elevated phos, calcium corrects to normal, start phoslo  Diet:  Renal  Access:  PIV IVF:  off Proph:  heparin  Code Status: full Family Communication: Patient alone Disposition Plan: Pending further diuresis, vascular surgery evaluation for fistula   Consultants:  Nephrology  Vascular surgery  Procedures:  Fistula placed on 12/30   Antibiotics:  None   HPI/Subjective:  Continues to wheeze, but swelling is better and he feels like he is losing water weight.  Happy  about knowing about his fluid restriction.    Objective: Filed  Vitals:   01/01/15 0500 01/01/15 0900 01/01/15 0915 01/01/15 0959  BP: 121/84 102/64 122/76 131/73  Pulse: 81 73 71 71  Temp: 98.2 F (36.8 C) 97.3 F (36.3 C)  97.6 F (36.4 C)  TempSrc: Oral   Oral  Resp: Height:      Weight:      SpO2: 98% 94% 95% 97%    Intake/Output Summary (Last 24 hours) at 01/01/15 1645 Last data filed at 01/01/15 1100  Gross per 24 hour  Intake    692 ml  Output   1455 ml  Net   -763 ml   Filed Weights   12/30/14 1330 12/30/14 2125 12/31/14 2100  Weight: 124 kg (273 lb 5.9 oz) 119 kg (262 lb 5.6 oz) 114.261 kg (251 lb 14.4 oz)   Body mass index is 32.33 kg/(m^2).  Exam:   General:  Obese male, No acute distress  HEENT:  NCAT, MMM, face appears puffy  Cardiovascular:  RRR, nl S1, S2 no mrg, 2+ pulses, warm extremities  Respiratory:  Diminished at bases with wheeze, no rales or rhonchi, no increased WOB  Abdomen:   NABS, soft, NT/ND  MSK:   Normal tone and bulk, 1+ pitting bilateral LEE  Data Reviewed: Basic Metabolic Panel:  Recent Labs Lab 12/30/14 1548 12/31/14 0615 01/01/15 1120  NA 138 137 138  138  K 4.6 3.8 4.0  3.9  CL 108 107 105  104  CO2 21* GLUCOSE 348* 212* 197*  196*  BUN 47* 48* 54*  55*  CREATININE 4.06* 4.13* 4.57*  4.59*  CALCIUM 7.4* 7.7* 8.2*  8.2*  MG 1.5*  --   --   PHOS 5.4* 6.5* 7.3*   Liver Function Tests:  Recent Labs Lab 12/30/14 1548 12/31/14 0615 01/01/15 1120  AST 17  --   --   ALT 16*  --   --   ALKPHOS 74  --   --   BILITOT 0.7  --   --   PROT 4.5*  --   --   ALBUMIN 1.9* 1.8* 1.9*   No results for input(s): LIPASE, AMYLASE in the last 168 hours. No results for input(s): AMMONIA in the last 168 hours. CBC:  Recent Labs Lab 12/30/14 1548 01/01/15 1120  WBC 7.5 5.7  NEUTROABS 5.4  --   HGB 10.9* 11.0*  HCT 32.5* 32.3*  MCV 84.0 85.2  PLT 243 243    No results found for this or any previous visit (from the past 240 hour(s)).   Studies: No  results found.  Scheduled Meds: . amLODipine  10 mg Oral Daily  . calcium acetate  667 mg Oral TID WC  . citalopram  40 mg Oral Daily  . cloNIDine  0.1 mg Oral TID  . cyclobenzaprine  10 mg Oral QHS  . furosemide  160 mg Oral TID  . gabapentin  400 mg Oral QHS  . heparin  5,000 Units Subcutaneous 3 times per day  . hydrALAZINE  25 mg Oral TID  . insulin aspart  0-5 Units Subcutaneous QHS  . insulin aspart  0-9 Units Subcutaneous TID WC  . insulin aspart  6 Units Subcutaneous TID WC  . insulin glargine  30 Units Subcutaneous Daily  . levothyroxine  50 mcg Oral QAC breakfast  . metoprolol succinate  100 mg Oral Daily  . pantoprazole  40  mg Oral Daily  . sodium chloride  3 mL Intravenous Q12H   Continuous Infusions:   Principal Problem:   Acute renal failure superimposed on stage 4 chronic kidney disease (HCC) Active Problems:   Hypothyroidism (acquired)   Diabetic neuropathy (HCC)   Uncontrolled diabetes mellitus type 2 with peripheral artery disease (HCC)   Acute on chronic renal failure (HCC)   Hx of gastroesophageal reflux (GERD)   Prolonged Q-T interval on ECG   AKI (acute kidney injury) (HCC)   Hypertensive urgency    Time spent: 30 min    Russell [name]  [facility] Pager [phone]. If 7PM-7AM, please contact night-coverage at [email or web address], password [email or web address] 01/01/2015, 4:45 PM  LOS: 2 days

## 2015-01-01 NOTE — Transfer of Care (Signed)
Immediate Anesthesia Transfer of Care Note  Patient: Russell [name]  Procedure(s) Performed: Procedure(s): CREATION OF LEFT RADIAL CEPHALIC ARTERIOVENOUS (AV) FISTULA  (Left)  Patient Location: PACU  Anesthesia Type:MAC  Level of Consciousness: awake, alert  and oriented  Airway & Oxygen Therapy: Patient Spontanous Breathing  Post-op Assessment: Report given to RN, Post -op Vital signs reviewed and stable and Patient moving all extremities X 4  Post vital signs: stable  Last Vitals:  Filed Vitals:   12/31/14 2100 01/01/15 0500  BP: 145/79 121/84  Pulse: 80 81  Temp: 36.6 C 36.8 C  Resp: 18 18    Complications: No apparent anesthesia complications

## 2015-01-01 NOTE — Anesthesia Postprocedure Evaluation (Signed)
Anesthesia Post Note  Patient: Russell [name]  Procedure(s) Performed: Procedure(s) (LRB): CREATION OF LEFT RADIAL CEPHALIC ARTERIOVENOUS (AV) FISTULA  (Left)  Patient location during evaluation: PACU Anesthesia Type: MAC Level of consciousness: awake and alert Pain management: pain level controlled Vital Signs Assessment: post-procedure vital signs reviewed and stable Respiratory status: spontaneous breathing, nonlabored ventilation, respiratory function stable and patient connected to nasal cannula oxygen Cardiovascular status: stable and blood pressure returned to baseline Anesthetic complications: no    Last Vitals:  Filed Vitals:   01/01/15 0915 01/01/15 0959  BP: 122/76 131/73  Pulse: 71 71  Temp:  36.4 C  Resp: 11 13    Last Pain:  Filed Vitals:   01/01/15 0959  PainSc: Asleep                 Arienne Gartin,[name] TERRILL

## 2015-01-01 NOTE — Op Note (Signed)
OPERATIVE REPORT  Date of Surgery: 12/30/2014 - 01/01/2015  Surgeon: Josephina GipJames Alianna Wurster, MD  Assistant: Doreatha MassedSamantha Rhyne  PA  Pre-op Diagnosis: Stage IV Chronic Kidney Disease N18.4  Post-op Diagnosis: Stage IV Chronic Kidney Disease N18.4  Procedure: Procedure(s): CREATION OF LEFT RADIAL CEPHALIC ARTERIOVENOUS (AV) FISTULA   Anesthesia: Mack  EBL: Minimal  Complications: None  Procedure Details: The patient was taken the operating and placed in supine position at which time the left upper extremity was prepped Betadine scrub and solution draped in routine sterile manner. The cephalic vein was imaged using B mode ultrasound-sono site in the cephalic vein in the forearm did appear to be satisfactory for fistula creation compatible with the preoperative vein mapping. After infiltration forms and Xylocaine with epinephrine short longitudinal incision was made between the radial artery and cephalic vein proximal to the wrist cephalic vein dissected free ligated distally transected branches ligated with 3 and 4-0 silk ties and divided. It was gently dilated with heparinized saline and marked for orientation purposes. It was about a 3 mm vein which was patent up to the antecubital area. Radial artery was exposed beneath the fascia being careful not to injure the radial nerve. Artery was a good vessel 3 mm in size with a good pulse. It was occluded proximally and distally with vessel loops opened 15 blade extended with Potts scissors a 3 dilator foot traverse it proximally with good inflow. Vein was carefully measured spatulated and anastomosed inside with 6-0 Prolene. The Vesseloops released there was excellent pulse and thrill in the fistula with audible Doppler flow up to the antecubital area. Adequate hemostasis was achieved. No heparin was given. The wound was closed in layers with Vicryl in a subcuticular fashion with Dermabond patient taken to the recovery room in satisfactory condition   Josephina GipJames  Mckenzee Beem, MD 01/01/2015 8:57 AM

## 2015-01-01 NOTE — Anesthesia Preprocedure Evaluation (Signed)
Anesthesia Evaluation  Patient identified by MRN, date of birth, ID band Patient awake    Reviewed: Allergy & Precautions, NPO status , Patient's Chart, lab work & pertinent test results  Airway Mallampati: II   Neck ROM: Full    Dental  (+) Partial Upper   Pulmonary asthma ,    breath sounds clear to auscultation       Cardiovascular hypertension, + Peripheral Vascular Disease   Rhythm:Regular Rate:Normal     Neuro/Psych  Headaches,    GI/Hepatic   Endo/Other  diabetes, Poorly ControlledMorbid obesity  Renal/GU ESRFRenal disease     Musculoskeletal  (+) Arthritis ,   Abdominal (+) + obese,   Peds  Hematology   Anesthesia Other Findings   Reproductive/Obstetrics                             Anesthesia Physical Anesthesia Plan  ASA: III  Anesthesia Plan: MAC   Post-op Pain Management:    Induction: Intravenous  Airway Management Planned: Natural Airway and Simple Face Mask  Additional Equipment:   Intra-op Plan:   Post-operative Plan:   Informed Consent: I have reviewed the patients History and Physical, chart, labs and discussed the procedure including the risks, benefits and alternatives for the proposed anesthesia with the patient or authorized representative who has indicated his/her understanding and acceptance.   Dental advisory given  Plan Discussed with: CRNA and Surgeon  Anesthesia Plan Comments:         Anesthesia Quick Evaluation

## 2015-01-02 LAB — GLUCOSE, CAPILLARY
Glucose-Capillary: 160 mg/dL — ABNORMAL HIGH (ref 65–99)
Glucose-Capillary: 119 mg/dL — ABNORMAL HIGH (ref 65–99)
Glucose-Capillary: 123 mg/dL — ABNORMAL HIGH (ref 65–99)
Glucose-Capillary: 202 mg/dL — ABNORMAL HIGH (ref 65–99)

## 2015-01-02 LAB — RENAL FUNCTION PANEL
ANION GAP: 7 (ref 5–15)
Albumin: 1.8 g/dL — ABNORMAL LOW (ref 3.5–5.0)
BUN: 62 mg/dL — ABNORMAL HIGH (ref 6–20)
CO2: 25 mmol/L (ref 22–32)
Calcium: 8.1 mg/dL — ABNORMAL LOW (ref 8.9–10.3)
Chloride: 106 mmol/L (ref 101–111)
Creatinine, Ser: 5.28 mg/dL — ABNORMAL HIGH (ref 0.61–1.24)
GFR calc Af Amer: 14 mL/min — ABNORMAL LOW (ref >60)
GFR calc non Af Amer: 12 mL/min — ABNORMAL LOW (ref >60)
GLUCOSE: 200 mg/dL — AB (ref 65–99)
POTASSIUM: 4 mmol/L (ref 3.5–5.1)
Phosphorus: 8.1 mg/dL — ABNORMAL HIGH (ref 2.5–4.6)
SODIUM: 138 mmol/L (ref 135–145)

## 2015-01-02 MED ORDER — IPRATROPIUM-ALBUTEROL 0.5-2.5 (3) MG/3ML IN SOLN
3.0000 mL | RESPIRATORY_TRACT | Status: DC | PRN
  Administered 2015-01-02 (×2): 3 mL via RESPIRATORY_TRACT
  Filled 2015-01-02 (×2): qty 3

## 2015-01-02 MED ORDER — BUDESONIDE 0.5 MG/2ML IN SUSP
0.5000 mg | Freq: Two times a day (BID) | RESPIRATORY_TRACT | Status: DC
  Administered 2015-01-02 – 2015-01-07 (×8): 0.5 mg via RESPIRATORY_TRACT
  Filled 2015-01-02 (×10): qty 2

## 2015-01-02 MED ORDER — ARFORMOTEROL TARTRATE 15 MCG/2ML IN NEBU
15.0000 ug | INHALATION_SOLUTION | Freq: Two times a day (BID) | RESPIRATORY_TRACT | Status: DC
  Administered 2015-01-02 – 2015-01-07 (×8): 15 ug via RESPIRATORY_TRACT
  Filled 2015-01-02 (×9): qty 2

## 2015-01-02 MED ORDER — INSULIN GLARGINE 100 UNIT/ML ~~LOC~~ SOLN
35.0000 [IU] | Freq: Every day | SUBCUTANEOUS | Status: DC
  Administered 2015-01-03 – 2015-01-04 (×2): 35 [IU] via SUBCUTANEOUS
  Filled 2015-01-02 (×2): qty 0.35

## 2015-01-02 MED ORDER — CALCIUM CARBONATE ANTACID 500 MG PO CHEW
400.0000 mg | CHEWABLE_TABLET | Freq: Three times a day (TID) | ORAL | Status: DC
  Administered 2015-01-02 – 2015-01-04 (×7): 400 mg via ORAL
  Filled 2015-01-02 (×7): qty 2

## 2015-01-02 NOTE — Progress Notes (Signed)
   Daily Progress Note  Assessment/Planning: POD #1 s/p L RC AVF   No steal syndrome  Follow up in the office in 6-8 weeks  Subjective  - 1 Day Post-Op  Pain at incision  Objective Filed Vitals:   01/01/15 1646 01/01/15 2034 01/02/15 0555 01/02/15 0840  BP: 147/81 122/73 103/64   Pulse: 76 80 77   Temp: 98.2 F (36.8 C) 97.8 F (36.6 C) 97.9 F (36.6 C)   TempSrc: Oral  Oral   Resp: 12 19 18    Height:      Weight:  270 lb (122.471 kg)  263 lb 3.7 oz (119.4 kg)  SpO2: 96% 99% 97%     Intake/Output Summary (Last 24 hours) at 01/02/15 0903 Last data filed at 01/02/15 0735  Gross per 24 hour  Intake    840 ml  Output   1375 ml  Net   -535 ml    VASC  Inc c/d/i, palpable thrill, intact sensation, hand grip 5/5,   Laboratory CBC    Component Value Date/Time   WBC 5.7 01/01/2015 1120   WBC 7.3 05/26/2014 1956   HGB 11.0* 01/01/2015 1120   HGB 14.2 05/26/2014 1956   HCT 32.3* 01/01/2015 1120   HCT 43.0* 05/26/2014 1956   PLT 243 01/01/2015 1120    BMET    Component Value Date/Time   NA 138 01/02/2015 0550   K 4.0 01/02/2015 0550   CL 106 01/02/2015 0550   CO2 25 01/02/2015 0550   GLUCOSE 200* 01/02/2015 0550   BUN 62* 01/02/2015 0550   CREATININE 5.28* 01/02/2015 0550   CREATININE 1.56* 05/26/2014 1946   CALCIUM 8.1* 01/02/2015 0550   GFRNONAA 12* 01/02/2015 0550   GFRNONAA 54* 05/26/2014 1946   GFRAA 14* 01/02/2015 0550   GFRAA 62 05/26/2014 1946    Leonides SakeBrian Chen, MD [facility] Office: [phone] Pager: [phone]  01/02/2015, 9:03 AM

## 2015-01-02 NOTE — Progress Notes (Signed)
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 amLODipine  10 mg Oral Daily  . calcium acetate  667 mg Oral TID WC  . citalopram  40 mg Oral Daily  . cloNIDine  0.1 mg Oral TID  . cyclobenzaprine  10 mg Oral QHS  . furosemide  160 mg Oral TID  . gabapentin  400 mg Oral QHS  . heparin  5,000 Units Subcutaneous 3 times per day  . insulin aspart  0-5 Units Subcutaneous QHS  . insulin aspart  0-9 Units Subcutaneous TID WC  . insulin aspart  6 Units Subcutaneous TID WC  . insulin glargine  30 Units Subcutaneous Daily  . levothyroxine  50 mcg Oral QAC breakfast  . metoprolol succinate  100 mg Oral Daily  . pantoprazole  40 mg Oral Daily  . sodium chloride  3 mL Intravenous Q12H   Continuous Infusions:  PRN Meds:.acetaminophen, clonazePAM, hydrALAZINE, HYDROmorphone, metoCLOPramide, oxyCODONE, promethazine  Current Labs: reviewed    Physical Exam:  Blood pressure 103/64, pulse 77, temperature 97.9 F (36.6 C), temperature source Oral, resp. rate 18, height 6\' 2"  (1.88 m), weight 122.471 kg (270 lb), SpO2 97 %. NAD RRR, no murmur CTAB, faint crackles in bases 2+ LEE to knees, pitting  A 1. 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 Anasarca / Hypervolemia 3. Hypoalbuminemia 4. Anemia, mild, normocytic; not req ESA 5. Hyperphosphatemia 6. HTN on CCB, clonidine, hydralazine; stable currenlty 7. DM2 with recent A1c 8.5% 8. Hx/o DVT completed AC 9. Chronic Headaches 10. S/p L RC AVF  01/01/2015  P 1. Hold diuretics for 24h; when resumed used torsemide, start at 100mg  qAM 2. Repeat weight today 3. Start Tums 2qAC 4. Daily weights, Daily Renal Panel, Strict I/Os, Avoid nephrotoxins (NSAIDs, judicious IV Contrast)   Sabra Heckyan Kenichi Cassada MD 01/02/2015, 8:14 AM   Recent Labs Lab 12/31/14 0615 01/01/15 1120 01/02/15 0550  NA 137 138  138 138  K 3.8 4.0  3.9 4.0  CL 107 105  104 106  CO2 22 24  24 25   GLUCOSE 212* 197*  196* 200*  BUN 48* 54*  55* 62*  CREATININE 4.13* 4.57*  4.59* 5.28*  CALCIUM 7.7* 8.2*  8.2* 8.1*  PHOS 6.5* 7.3* 8.1*    Recent Labs Lab 12/30/14 1548 01/01/15 1120  WBC 7.5 5.7  NEUTROABS 5.4  --   HGB 10.9* 11.0*  HCT 32.5* 32.3*  MCV 84.0 85.2  PLT 243 243

## 2015-01-02 NOTE — Progress Notes (Signed)
Nutrition Consult/Brief Note  RD consulted for diet education.  Patient sleeping upon visit.  Unable to wake.  RD provided handouts "Weight Loss Tips" and "Carbohydrate Counting for People With Diabetes" from [employer].  Body mass index is 33.78 kg/(m^2). Patient meets criteria for Obesity Class II based on current BMI.   Current diet order is Renal/Carbohydrate Modified, patient is consuming approximately 100% of meals at this time. Labs and medications reviewed.   No nutrition interventions warranted at this time. If nutrition issues arise, please consult RD.   Maureen ChattersKatie Keenya Matera, RD, LDN Pager #: [phone] After-Hours Pager #: [phone]

## 2015-01-02 NOTE — Progress Notes (Signed)
[facility] PROGRESS NOTE  TESLA BOCHICCHIO ZOX:[id number] DOB: 1970-12-11 DOA: 12/30/2014 PCP: [name], MD  Brief Summary  The patient is a 44 y.o. 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 He has had progressive kidney disease with 4 hospitalizations in the last 6 months for uncontrolled diabetes, nausea, vomiting, hypertensive urgency, and AKI.   Assessment/Plan  Progressive renal failure, creatinine trending up.  Poor diuresis yesterday with oral lasix.  Dry weight probably near 250-lbs, was 273-lbs at admission - patient reportedly had a renal vein clotting on renal artery [place] at [facility], however, renal artery duplex done on 9/21 demonstrated no renal artery stenosis, patent bilateral renal veins -  Hold diuretics - Renal diet - +359mL / 24h  Hypertensive urgency, BP much better with clonidine.  Hydralazine did not help much. - troponin negative -  continue Metoprolol 100 mg PO ER  -  Clonidine 0.1mg  TID - No ACEI or ARB - Diuresis as above  Mixed restrictive/obstructive pulmonary defect (per outpatient PFT done at [facility]). Increased wheezing - CXR without edema -  Start budesonide and duonebs -  Will try to avoid steroids to prevent fluid retention which may worsen breathing  Uncontrolled diabetes mellitus type 2 with peripheral artery disease, diabetic retinopathy, and neuropathy - increase lantus to 35 units (first dose was this morning) - continue  aspart 6 units with meals with low dose SSI -  A1c 8.5 - will be able to keep his eye appointment on 01/08/15  Obesity, needs weight loss  History of superficial venous thrombosis, H/o 01/2014  (no evident of DVT) - Xarelto discontinued due to worsening of renal function - Hematology Dr. Merlene Pulling who saw patient in 06/2014 for anticoagulation issues who discontinued anticoagulation since hypercoagulable panel was negative and the patient had been more active  Diabetic neuropathy - continue Gabapentin  Hypothyroidism (acquired) - continue Synthroid   Erosive esophagitis - D/c'd carafate to minimize aluminum toxicity - Continue protonix  Depression/anxiety, stable, continue celexa. Patient on two benzodiazepines at home. Will continue clonazepam for now and hold xanax.   NSVT, stable -  Keep electrolytes wnl -  ECHO from earlier this year without valvular dysfunction.  Preserved EF -  D/c telemetry  Anemia or renal disease.  Iron, b12, folate wnl.  TSH is mildly elevated in setting of acute illness but not likely responsible for anemia  Hypothryoidism, question of medication compliance and also checked in setting of acute illness -  Will continue current dose and have MD repeat TSH in 2-4 weeks  Elevated phos, calcium corrects to normal, start phoslo  Diet:  Renal  Access:  PIV IVF:  off Proph:  heparin  Code Status: full Family Communication: Patient alone Disposition Plan: Pending stable creatinine, adequate dose of diuretic to prevent fluid retention   Consultants:  Nephrology  Vascular surgery  Procedures:  Fistula placed on 12/30   Antibiotics:  None   HPI/Subjective:  Wheezing is worse today.  He was net positive of the last 24 hours on oral diuretic. Creatinine increased. Denies constipation. Noted that he voided less yesterday. Feels like his swelling is about the same as yesterday.  Objective: Filed Vitals:   01/01/15 2034 01/02/15 0555  01/02/15 0840 01/02/15 0902  BP: 122/73 103/64  136/72  Pulse: 80 77  77  Temp: 97.8 F (36.6 C) 97.9 F (36.6 C)  98.2 F (36.8 C)  TempSrc:  Oral  Oral  Resp: 19 18  18   Height:      Weight: 122.471 kg (270 lb)  119.4 kg (263 lb 3.7 oz)   SpO2: 99% 97%  98%    Intake/Output Summary (Last 24 hours) at 01/02/15 1136 Last data filed at 01/02/15 1018  Gross per 24 hour  Intake   1083 ml  Output   1925 ml  Net   -842 ml   Filed Weights   12/31/14 2100 01/01/15 2034 01/02/15 0840  Weight: 114.261 kg (251 lb 14.4 oz) 122.471 kg (270 lb) 119.4 kg (263 lb 3.7 oz)   Body mass index is 33.78 kg/(m^2).  Exam:   General:  Obese male, mild respiratory distress with audible wheeze when talking, intermittent SCM retractions  HEENT:  NCAT, MMM, face appears puffy  Cardiovascular:  RRR, nl S1, S2 no mrg, 2+ pulses, warm extremities  Respiratory:  Full x-ray wheeze, diminished at bilateral bases, no rales or rhonchi, no increased WOB  Abdomen:   NABS, soft, NT/ND  MSK:   Normal tone and bulk, 1+ pitting bilateral LEE, stable from yesterday  Data Reviewed: Basic Metabolic Panel:  Recent Labs Lab 12/30/14 1548 12/31/14 0615 01/01/15 1120 01/02/15 0550  NA 138 137 138  138 138  K 4.6 3.8 4.0  3.9 4.0  CL 108 107 105  104 106  CO2 21* 22 24  24 25   GLUCOSE 348* 212* 197*  196* 200*  BUN 47* 48* 54*  55* 62*  CREATININE 4.06* 4.13* 4.57*  4.59* 5.28*  CALCIUM 7.4* 7.7* 8.2*  8.2* 8.1*  MG 1.5*  --   --   --   PHOS 5.4* 6.5* 7.3* 8.1*   Liver Function Tests:  Recent Labs Lab 12/30/14 1548 12/31/14 0615 01/01/15 1120 01/02/15 0550  AST 17  --   --   --   ALT 16*  --   --   --   ALKPHOS 74  --   --   --   BILITOT 0.7  --   --   --   PROT 4.5*  --   --   --   ALBUMIN 1.9* 1.8* 1.9* 1.8*   No results for input(s): LIPASE, AMYLASE in the last 168 hours. No results for input(s): AMMONIA in the last 168 hours. CBC:  Recent Labs Lab 12/30/14 1548  01/01/15 1120  WBC 7.5 5.7  NEUTROABS 5.4  --   HGB 10.9* 11.0*  HCT 32.5* 32.3*  MCV 84.0 85.2  PLT 243 243    No results found for this or any previous visit (from the past 240 hour(s)).   Studies: No results found.  Scheduled Meds: . amLODipine  10 mg Oral Daily  . calcium carbonate  400 mg of elemental calcium Oral TID WC  . citalopram  40 mg Oral Daily  . cloNIDine  0.1 mg Oral TID  . cyclobenzaprine  10 mg Oral QHS  . gabapentin  400 mg Oral QHS  . heparin  5,000 Units Subcutaneous 3 times per day  . insulin aspart  0-5 Units Subcutaneous QHS  . insulin aspart  0-9 Units Subcutaneous TID WC  . insulin aspart  6 Units Subcutaneous TID WC  . insulin glargine  30 Units Subcutaneous Daily  . levothyroxine  50 mcg Oral QAC breakfast  . metoprolol succinate  100 mg Oral Daily  . pantoprazole  40 mg Oral Daily  . sodium chloride  3 mL Intravenous Q12H   Continuous Infusions:   Principal Problem:   Acute renal failure superimposed on stage 4 chronic kidney disease (HCC) Active Problems:   Hypothyroidism (acquired)   Diabetic neuropathy (HCC)   Uncontrolled diabetes mellitus type 2 with peripheral artery disease (HCC)   Acute on chronic renal failure (HCC)   Hx of gastroesophageal reflux (GERD)   Prolonged Q-T interval on ECG   AKI (acute kidney injury) (HCC)   Hypertensive urgency    Time spent: 30 min    Nadira Single  [facility] Pager [phone]. If 7PM-7AM, please contact night-coverage at [email or web address], password [facility] 01/02/2015, 11:36 AM  LOS: 3 days

## 2015-01-03 ENCOUNTER — Encounter: Payer: Self-pay | Admitting: Physician Assistant

## 2015-01-03 DIAGNOSIS — N049 Nephrotic syndrome with unspecified morphologic changes: Secondary | ICD-10-CM | POA: Insufficient documentation

## 2015-01-03 LAB — RENAL FUNCTION PANEL
ALBUMIN: 1.8 g/dL — AB (ref 3.5–5.0)
ANION GAP: 12 (ref 5–15)
BUN: 73 mg/dL — ABNORMAL HIGH (ref 6–20)
CALCIUM: 8 mg/dL — AB (ref 8.9–10.3)
CO2: 22 mmol/L (ref 22–32)
CREATININE: 5.88 mg/dL — AB (ref 0.61–1.24)
Chloride: 103 mmol/L (ref 101–111)
GFR calc non Af Amer: 11 mL/min — ABNORMAL LOW (ref >60)
GFR, EST AFRICAN AMERICAN: 12 mL/min — AB (ref >60)
GLUCOSE: 217 mg/dL — AB (ref 65–99)
PHOSPHORUS: 9.1 mg/dL — AB (ref 2.5–4.6)
Potassium: 4.5 mmol/L (ref 3.5–5.1)
SODIUM: 137 mmol/L (ref 135–145)

## 2015-01-03 LAB — GLUCOSE, CAPILLARY
GLUCOSE-CAPILLARY: 105 mg/dL — AB (ref 65–99)
GLUCOSE-CAPILLARY: 109 mg/dL — AB (ref 65–99)
Glucose-Capillary: 171 mg/dL — ABNORMAL HIGH (ref 65–99)

## 2015-01-03 MED ORDER — PREDNISONE 50 MG PO TABS
50.0000 mg | ORAL_TABLET | Freq: Every day | ORAL | Status: DC
  Administered 2015-01-03 – 2015-01-04 (×2): 50 mg via ORAL
  Filled 2015-01-03 (×2): qty 1

## 2015-01-03 MED ORDER — DOXYCYCLINE HYCLATE 100 MG PO TABS
100.0000 mg | ORAL_TABLET | Freq: Two times a day (BID) | ORAL | Status: AC
  Administered 2015-01-03 – 2015-01-05 (×4): 100 mg via ORAL
  Filled 2015-01-03 (×5): qty 1

## 2015-01-03 MED ORDER — CLONIDINE HCL 0.1 MG PO TABS
0.1000 mg | ORAL_TABLET | Freq: Two times a day (BID) | ORAL | Status: DC
Start: 2015-01-03 — End: 2015-01-08
  Administered 2015-01-03 – 2015-01-08 (×9): 0.1 mg via ORAL
  Filled 2015-01-03 (×10): qty 1

## 2015-01-03 NOTE — Progress Notes (Signed)
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 amLODipine  10 mg Oral Daily  . arformoterol  15 mcg Nebulization BID  . budesonide (PULMICORT) nebulizer solution  0.5 mg Nebulization BID  . calcium carbonate  400 mg of elemental calcium Oral TID WC  . citalopram  40 mg Oral Daily  . cloNIDine  0.1 mg Oral TID  . cyclobenzaprine  10 mg Oral QHS  . gabapentin  400 mg Oral QHS  . heparin  5,000 Units Subcutaneous 3 times per day  . insulin aspart  0-5 Units Subcutaneous QHS  . insulin aspart  0-9 Units Subcutaneous TID WC  . insulin aspart  6 Units Subcutaneous TID WC  . insulin glargine  35 Units Subcutaneous Daily  . levothyroxine  50 mcg Oral QAC breakfast  . metoprolol succinate  100 mg Oral Daily  . pantoprazole  40 mg Oral Daily  . sodium chloride  3 mL Intravenous Q12H   Continuous Infusions:  PRN Meds:.acetaminophen, clonazePAM, hydrALAZINE, HYDROmorphone, ipratropium-albuterol, metoCLOPramide, oxyCODONE, promethazine  Current Labs: reviewed    Physical Exam:  Blood pressure 116/76, pulse 76, temperature 97.4 F (36.3 C), temperature source Oral, resp. rate 19, height 6\' 2"  (1.88 m), weight 119.3 kg (263 lb 0.1 oz), SpO2 96 %. NAD RRR, no murmur CTAB, scattered exp wheezing 2+ LEE to knees, pitting L RC AVF +B/T; incision CDI  A 1. 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 Anasarca / Hypervolemia  improving 3. Hypoalbuminemia 4. Anemia, mild, normocytic; not req ESA 5. Hyperphosphatemia 6. HTN on CCB, clonidine, hydralazine; stable currenlty 7. DM2 with recent A1c 8.5% 8. Hx/o DVT completed AC 9. Chronic Headaches 10. S/p L RC AVF 01/01/2015  P 1. Cont to hold diuretics for another 24h; if resumed consider torsemide, start at 100mg  qAM 2. Pt frustrated, expresses maybe easier if just started HD, not sure we are at that point yet 3. Cont Tums 2qAC 4. Daily weights, Daily Renal Panel, Strict I/Os, Avoid nephrotoxins (NSAIDs, judicious IV Contrast)   Sabra Heckyan Kerin Kren MD 01/03/2015, 7:48 AM   Recent Labs Lab 01/01/15 1120 01/02/15 0550 01/03/15 0319  NA 138  138 138 137  K 4.0  3.9 4.0 4.5  CL 105  104 106 103  CO2 24  24 25 22   GLUCOSE 197*  196* 200* 217*  BUN 54*  55* 62* 73*  CREATININE 4.57*  4.59* 5.28* 5.88*  CALCIUM 8.2*  8.2* 8.1* 8.0*  PHOS 7.3* 8.1* 9.1*    Recent Labs Lab 12/30/14 1548 01/01/15 1120  WBC 7.5 5.7  NEUTROABS 5.4  --   HGB 10.9* 11.0*  HCT 32.5* 32.3*  MCV 84.0 85.2  PLT 243 243

## 2015-01-03 NOTE — Progress Notes (Addendum)
[facility] PROGRESS NOTE  HARLESS MOLINARI ZOX:[id number] DOB: 1970/11/15 DOA: 12/30/2014 PCP: [name], MD  Brief Summary  The patient is a 45 y.o. 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 He has had progressive kidney disease with 4 hospitalizations in the last 6 months for uncontrolled diabetes, nausea, vomiting, hypertensive urgency, and AKI.   Assessment/Plan  Progressive renal failure, creatinine trending up.  Wt stable.  I/O suggest negative 1.2L despite no diuretics - patient reportedly had a renal vein clotting on renal artery [place] at [facility], however, renal artery duplex done on 9/21 demonstrated no renal artery stenosis, patent bilateral renal veins -  Hold diuretics - Renal diet -  Developing some nausea, early signs of uremia  Hypertensive urgency, BP much better with clonidine.  Hydralazine did not help much. - troponin negative -  continue Metoprolol 100 mg PO ER  -  Clonidine 0.1mg  TID - No ACEI or ARB - Diuresis as above  Mixed restrictive/obstructive pulmonary defect (per outpatient PFT done at [facility]). Still wheezing - CXR without edema -  Continue budesonide/brovana and duonebs -  Add prednisone and doxycycline (QTc prolongation) for acute exacerbation -  Repeat CXR in AM  Uncontrolled diabetes mellitus type 2 with peripheral artery disease, diabetic retinopathy, and neuropathy - continue lantus to 35 units (first dose was this morning) -  continue aspart 6 units with meals with low dose SSI -  A1c 8.5 -  May need to cancel 1/6 eye appointment  Obesity, needs weight loss  History of superficial venous thrombosis, H/o 01/2014  (no evident of DVT) - Xarelto discontinued due to worsening of renal function - Hematology Dr. Merlene Pulling who saw patient in 06/2014 for anticoagulation issues who discontinued anticoagulation since hypercoagulable panel was negative and the patient had been more active  Diabetic neuropathy - continue Gabapentin  Hypothyroidism (acquired) - continue Synthroid   Erosive esophagitis - D/c'd carafate to minimize aluminum toxicity - Continue protonix  Depression/anxiety, stable, continue celexa. Patient on two benzodiazepines at home. Will continue clonazepam for now and hold xanax.   NSVT, stable, hx of prolonged QTc -  Keep electrolytes wnl -  ECHO from earlier this year without valvular dysfunction.  Preserved EF  Anemia or renal disease.  Iron, b12, folate wnl.  TSH is mildly elevated in setting of acute illness but not likely responsible for anemia  Hypothryoidism, question of medication compliance and also checked in setting of acute illness -  Will continue current dose and have MD repeat TSH in 2-4 weeks  Elevated phos, calcium corrects to normal, start phoslo  Diet:  Renal  Access:  PIV IVF:  off Proph:  heparin  Code Status: full Family Communication: Patient alone Disposition Plan: Pending stable creatinine, adequate dose of diuretic to prevent fluid retention   Consultants:  Nephrology  Vascular surgery  Procedures:  Fistula placed on 12/30   Antibiotics:  None   HPI/Subjective:  Wheezing is even worse today.  Wants to start dialysis.  Developing nausea and headache.    Objective: Filed Vitals:   01/02/15 2122 01/03/15 0456 01/03/15 0841 01/03/15 1026  BP:  116/76 110/65   Pulse:  76 76 88   Temp:  97.4 F (36.3 C) 98 F (36.7 C)   TempSrc:  Oral Oral    Resp: 20 19 20    Height:      Weight:      SpO2: 99% 96% 96% 97%    Intake/Output Summary (Last 24 hours) at 01/03/15 1629 Last data filed at 01/03/15 1328  Gross per 24 hour  Intake    720 ml  Output    700 ml  Net     20 ml   Filed Weights   01/01/15 2034 01/02/15 0840 01/02/15 2024  Weight: 122.471 kg (270 lb) 119.4 kg (263 lb 3.7 oz) 119.3 kg (263 lb 0.1 oz)   Body mass index is 33.75 kg/(m^2).  Exam:   General:  Obese male, mild respiratory distress with audible wheeze when talking, intermittent SCM retractions  HEENT:  NCAT, MMM, face appears puffy  Cardiovascular:  RRR, nl S1, S2 no mrg, 2+ pulses, warm extremities  Respiratory:  Full expiratory wheeze, diminished at bilateral bases, no rales or rhonchi, no increased WOB  Abdomen:   NABS, soft, NT/ND  MSK:   Normal tone and bulk, 1+ pitting bilateral LEE, stable from yesterday  Data Reviewed: Basic Metabolic Panel:  Recent Labs Lab 12/30/14 1548 12/31/14 0615 01/01/15 1120 01/02/15 0550 01/03/15 0319  NA 138 137 138  138 138 137  K 4.6 3.8 4.0  3.9 4.0 4.5  CL 108 107 105  104 106 103  CO2 21* 22 24  24 25 22   GLUCOSE 348* 212* 197*  196* 200* 217*  BUN 47* 48* 54*  55* 62* 73*  CREATININE 4.06* 4.13* 4.57*  4.59* 5.28* 5.88*  CALCIUM 7.4* 7.7* 8.2*  8.2* 8.1* 8.0*  MG 1.5*  --   --   --   --   PHOS 5.4* 6.5* 7.3* 8.1* 9.1*   Liver Function Tests:  Recent Labs Lab 12/30/14 1548 12/31/14 0615 01/01/15 1120 01/02/15 0550 01/03/15 0319  AST 17  --   --   --   --   ALT 16*  --   --   --   --   ALKPHOS 74  --   --   --   --   BILITOT 0.7  --   --   --   --   PROT 4.5*  --   --   --   --   ALBUMIN 1.9* 1.8* 1.9* 1.8* 1.8*   No results for input(s): LIPASE, AMYLASE in the last 168 hours. No results for input(s): AMMONIA in the last 168 hours. CBC:  Recent Labs Lab 12/30/14 1548 01/01/15 1120  WBC 7.5 5.7  NEUTROABS 5.4  --   HGB 10.9* 11.0*  HCT 32.5* 32.3*  MCV 84.0 85.2   PLT 243 243    No results found for this or any previous visit (from the past 240 hour(s)).   Studies: No results found.  Scheduled Meds: . arformoterol  15 mcg Nebulization BID  . budesonide (PULMICORT) nebulizer solution  0.5 mg Nebulization BID  . calcium carbonate  400 mg of elemental calcium Oral TID WC  . citalopram  40 mg Oral Daily  . cloNIDine  0.1 mg Oral BID  . cyclobenzaprine  10 mg Oral QHS  . gabapentin  400 mg Oral QHS  . heparin  5,000 Units Subcutaneous 3 times per day  . insulin aspart  0-5 Units Subcutaneous QHS  . insulin aspart  0-9 Units Subcutaneous TID  WC  . insulin aspart  6 Units Subcutaneous TID WC  . insulin glargine  35 Units Subcutaneous Daily  . levothyroxine  50 mcg Oral QAC breakfast  . metoprolol succinate  100 mg Oral Daily  . pantoprazole  40 mg Oral Daily  . sodium chloride  3 mL Intravenous Q12H   Continuous Infusions:   Principal Problem:   Acute renal failure superimposed on stage 4 chronic kidney disease (HCC) Active Problems:   Hypothyroidism (acquired)   Diabetic neuropathy (HCC)   Uncontrolled diabetes mellitus type 2 with peripheral artery disease (HCC)   Acute on chronic renal failure (HCC)   Hx of gastroesophageal reflux (GERD)   Prolonged Q-T interval on ECG   AKI (acute kidney injury) (HCC)   Hypertensive urgency    Time spent: 30 min    Nyaisha Simao  [facility] Pager [phone]. If 7PM-7AM, please contact night-coverage at [email or web address], password [email or web address] 01/03/2015, 4:29 PM  LOS: 4 days

## 2015-01-04 ENCOUNTER — Other Ambulatory Visit: Payer: Self-pay

## 2015-01-04 DIAGNOSIS — I4581 Long QT syndrome: Secondary | ICD-10-CM

## 2015-01-04 LAB — BASIC METABOLIC PANEL
Anion gap: 12 (ref 5–15)
BUN: 105 mg/dL — AB (ref 6–20)
CO2: 18 mmol/L — ABNORMAL LOW (ref 22–32)
Calcium: 8.4 mg/dL — ABNORMAL LOW (ref 8.9–10.3)
Chloride: 101 mmol/L (ref 101–111)
Creatinine, Ser: 7.57 mg/dL — ABNORMAL HIGH (ref 0.61–1.24)
GFR calc Af Amer: 9 mL/min — ABNORMAL LOW (ref >60)
GFR, EST NON AFRICAN AMERICAN: 8 mL/min — AB (ref >60)
GLUCOSE: 570 mg/dL — AB (ref 65–99)
POTASSIUM: 4.9 mmol/L (ref 3.5–5.1)
SODIUM: 131 mmol/L — AB (ref 135–145)

## 2015-01-04 LAB — RENAL FUNCTION PANEL
Albumin: 2 g/dL — ABNORMAL LOW (ref 3.5–5.0)
Anion gap: 12 (ref 5–15)
BUN: 93 mg/dL — AB (ref 6–20)
CALCIUM: 8.4 mg/dL — AB (ref 8.9–10.3)
CHLORIDE: 103 mmol/L (ref 101–111)
CO2: 21 mmol/L — AB (ref 22–32)
CREATININE: 6.7 mg/dL — AB (ref 0.61–1.24)
GFR calc Af Amer: 11 mL/min — ABNORMAL LOW (ref >60)
GFR calc non Af Amer: 9 mL/min — ABNORMAL LOW (ref >60)
GLUCOSE: 301 mg/dL — AB (ref 65–99)
Phosphorus: 7.4 mg/dL — ABNORMAL HIGH (ref 2.5–4.6)
Potassium: 5.2 mmol/L — ABNORMAL HIGH (ref 3.5–5.1)
SODIUM: 136 mmol/L (ref 135–145)

## 2015-01-04 LAB — GLUCOSE, CAPILLARY
GLUCOSE-CAPILLARY: 330 mg/dL — AB (ref 65–99)
GLUCOSE-CAPILLARY: 454 mg/dL — AB (ref 65–99)
GLUCOSE-CAPILLARY: 533 mg/dL — AB (ref 65–99)
Glucose-Capillary: 292 mg/dL — ABNORMAL HIGH (ref 65–99)
Glucose-Capillary: 475 mg/dL — ABNORMAL HIGH (ref 65–99)

## 2015-01-04 MED ORDER — INSULIN GLARGINE 100 UNIT/ML ~~LOC~~ SOLN
40.0000 [IU] | Freq: Every day | SUBCUTANEOUS | Status: DC
  Administered 2015-01-06 – 2015-01-08 (×3): 40 [IU] via SUBCUTANEOUS
  Filled 2015-01-04 (×5): qty 0.4

## 2015-01-04 MED ORDER — PROMETHAZINE HCL 25 MG PO TABS: 12.5000 mg | ORAL_TABLET | Freq: Four times a day (QID) | ORAL | Status: DC | PRN

## 2015-01-04 MED ORDER — ALBUTEROL SULFATE (2.5 MG/3ML) 0.083% IN NEBU: 2.5000 mg | INHALATION_SOLUTION | RESPIRATORY_TRACT | Status: DC | PRN

## 2015-01-04 MED ORDER — INSULIN ASPART 100 UNIT/ML ~~LOC~~ SOLN
0.0000 [IU] | Freq: Once | SUBCUTANEOUS | Status: AC
Start: 2015-01-04 — End: 2015-01-04
  Administered 2015-01-04: 9 [IU] via SUBCUTANEOUS

## 2015-01-04 MED ORDER — INSULIN ASPART 100 UNIT/ML ~~LOC~~ SOLN
0.0000 [IU] | Freq: Three times a day (TID) | SUBCUTANEOUS | Status: DC
  Administered 2015-01-04: 20 [IU] via SUBCUTANEOUS
  Administered 2015-01-05 (×3): 11 [IU] via SUBCUTANEOUS
  Administered 2015-01-06: 4 [IU] via SUBCUTANEOUS
  Administered 2015-01-06: 7 [IU] via SUBCUTANEOUS
  Administered 2015-01-07: 11 [IU] via SUBCUTANEOUS
  Administered 2015-01-08: 3 [IU] via SUBCUTANEOUS
  Administered 2015-01-08: 7 [IU] via SUBCUTANEOUS

## 2015-01-04 MED ORDER — INSULIN ASPART 100 UNIT/ML ~~LOC~~ SOLN: 0.0000 [IU] | Freq: Every day | SUBCUTANEOUS | Status: DC

## 2015-01-04 MED ORDER — CALCIUM ACETATE (PHOS BINDER) 667 MG PO CAPS
1334.0000 mg | ORAL_CAPSULE | Freq: Three times a day (TID) | ORAL | Status: DC
  Administered 2015-01-04 – 2015-01-08 (×9): 1334 mg via ORAL
  Filled 2015-01-04 (×8): qty 2

## 2015-01-04 MED ORDER — INSULIN ASPART 100 UNIT/ML ~~LOC~~ SOLN
15.0000 [IU] | Freq: Once | SUBCUTANEOUS | Status: AC
  Administered 2015-01-04: 15 [IU] via SUBCUTANEOUS

## 2015-01-04 NOTE — Progress Notes (Signed)
Patient's cbg 454. MD notified. Patient stated he feels fine. will continue to monitor

## 2015-01-04 NOTE — Progress Notes (Signed)
Patient complained of inability to void. Bladder scan 664 ml. Order received for in and out cath.

## 2015-01-04 NOTE — Progress Notes (Signed)
[facility] PROGRESS NOTE  Russell BirkenheadSteven B Rawe ZOX:[id number] DOB: 06-Apr-1970 DOA: 12/30/2014 PCP: Elvina SidleLAUENSTEIN,KURT, MD  Brief Summary  The patient is a 45 y.o. 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 He has had progressive kidney disease with 4 hospitalizations in the last 6 months for uncontrolled diabetes, nausea, vomiting, hypertensive urgency, and AKI.   Assessment/Plan  ESRD, creatinine trending up, uop decreasing, mild hyperkalemia, and developing uremic symptoms despite holding diuretics -  Plan for HD tomorrow  -  Appreciate nephrology and vascular surgery assistance - Renal diet  Hypertensive urgency, BP much better with clonidine.  Hydralazine did not help much. -  continue Metoprolol 100 mg PO ER  -  Continue Clonidine 0.1mg  TID - may need to decrease blood pressure medications when starting HD  Acute COPD exacerbation.  Mixed restrictive/obstructive pulmonary defect (per outpatient PFT done at [facility]). Wheezing improving - CXR without edema -  Continue budesonide/brovana -  D/c duoneb 2/2 urinary retention -  Start albuterol monotherapy -  Day 2 prednisone and doxycycline (QTc prolongation) for acute exacerbation  Uncontrolled diabetes mellitus type 2 with peripheral artery disease, diabetic retinopathy, and neuropathy, CBG elevated 2/2 steroids - increase lantus to 40 units - continue aspart 6 units with meals -  Increase to high dose SSI -  A1c 8.5 -   Will need to cancel 1/6 eye appointment  Obesity, needs weight loss  History of superficial venous thrombosis, H/o 01/2014  (no evident of DVT), Hematology Dr. Merlene Pullingorcoran discontinued anticoagulation since hypercoagulable panel was negative and the patient had been more active  Diabetic neuropathy, stable, continue Gabapentin  Hypothyroidism (acquired), stable, continue Synthroid   Erosive esophagitis, stable, continue protonix  Depression/anxiety, stable, continue celexa. Patient on two benzodiazepines at home > suggested narrowing to 1 -  continue clonazepam for now and hold xanax.   NSVT, stable, hx of prolonged QTc -  Keep electrolytes wnl -  ECHO from earlier this year without valvular dysfunction.  Preserved EF -  Avoid QTc prolonging medications  Anemia or renal disease.  Iron, b12, folate wnl.  TSH is mildly elevated in setting of acute illness but not likely responsible for anemia  Hypothryoidism, question of medication compliance and also checked in setting of acute illness -  Will continue current dose and have MD repeat TSH in 2-4 weeks  Elevated phos, calcium corrects to normal, start phoslo  Diet:  Renal  Access:  PIV IVF:  off Proph:  heparin  Code Status: full Family Communication: Patient alone Disposition Plan:  Starting HD.  Start CLIP process   Consultants:  Nephrology  Vascular surgery  Procedures:  Fistula placed on 12/30   Antibiotics:  None   HPI/Subjective:  Nausea without vomiting.  Swelling is worsening.  Wheezing is better with breathing treatments  Objective: Filed Vitals:   01/03/15 1755 01/03/15 2040 01/04/15 0504 01/04/15 0746  BP: 124/68 126/92 140/70 134/74  Pulse: 86 83 96 93  Temp: 99.5 F (37.5 C) 98.4 F (36.9 C) 97.7 F (36.5 C) 98 F (36.7 C)  TempSrc: Oral  Oral Oral  Resp: 20 18 20 20   Height:  Weight:      SpO2: 98% 95% 96% 97%    Intake/Output Summary (Last 24 hours) at 01/04/15 1348 Last data  filed at 01/04/15 1200  Gross per 24 hour  Intake    702 ml  Output    750 ml  Net    -48 ml   Filed Weights   01/01/15 2034 01/02/15 0840 01/02/15 2024  Weight: 122.471 kg (270 lb) 119.4 kg (263 lb 3.7 oz) 119.3 kg (263 lb 0.1 oz)   Body mass index is 33.75 kg/(m^2).  Exam:   General:  Obese male, NAD  HEENT:  NCAT, MMM, face appears more edematous today  Cardiovascular:  RRR, nl S1, S2 no mrg, 2+ pulses, warm extremities  Respiratory:  Improved aeration, decreased wheeze, diminished at bilateral bases, no rales or rhonchi, no increased WOB (completed nebulizer treatment about 20 minutes ago)  Abdomen:   NABS, soft, NT/ND  MSK:   Normal tone and bulk, 2+ pitting bilateral LEE    Data Reviewed: Basic Metabolic Panel:  Recent Labs Lab 12/30/14 1548 12/31/14 0615 01/01/15 1120 01/02/15 0550 01/03/15 0319 01/04/15 0454  NA 138 137 138  138 138 137 136  K 4.6 3.8 4.0  3.9 4.0 4.5 5.2*  CL 108 107 105  104 106 103 103  CO2 21* 22 24  24 25 22  21*  GLUCOSE 348* 212* 197*  196* 200* 217* 301*  BUN 47* 48* 54*  55* 62* 73* 93*  CREATININE 4.06* 4.13* 4.57*  4.59* 5.28* 5.88* 6.70*  CALCIUM 7.4* 7.7* 8.2*  8.2* 8.1* 8.0* 8.4*  MG 1.5*  --   --   --   --   --   PHOS 5.4* 6.5* 7.3* 8.1* 9.1* 7.4*   Liver Function Tests:  Recent Labs Lab 12/30/14 1548 12/31/14 0615 01/01/15 1120 01/02/15 0550 01/03/15 0319 01/04/15 0454  AST 17  --   --   --   --   --   ALT 16*  --   --   --   --   --   ALKPHOS 74  --   --   --   --   --   BILITOT 0.7  --   --   --   --   --   PROT 4.5*  --   --   --   --   --   ALBUMIN 1.9* 1.8* 1.9* 1.8* 1.8* 2.0*   No results for input(s): LIPASE, AMYLASE in the last 168 hours. No results for input(s): AMMONIA in the last 168 hours. CBC:  Recent Labs Lab 12/30/14 1548 01/01/15 1120  WBC 7.5 5.7  NEUTROABS 5.4  --   HGB 10.9* 11.0*  HCT 32.5* 32.3*  MCV 84.0 85.2  PLT 243 243    No results found for this or any  previous visit (from the past 240 hour(s)).   Studies: No results found.  Scheduled Meds: . arformoterol  15 mcg Nebulization BID  . budesonide (PULMICORT) nebulizer solution  0.5 mg Nebulization BID  . calcium acetate  1,334 mg Oral TID WC  . citalopram  40 mg Oral Daily  . cloNIDine  0.1 mg Oral BID  . cyclobenzaprine  10 mg Oral QHS  . doxycycline  100 mg Oral Q12H  . gabapentin  400 mg Oral QHS  . heparin  5,000 Units Subcutaneous 3 times per day  . insulin aspart  0-5 Units Subcutaneous QHS  . insulin aspart  0-9 Units Subcutaneous TID  WC  . insulin aspart  6 Units Subcutaneous TID WC  . insulin glargine  35 Units Subcutaneous Daily  . levothyroxine  50 mcg Oral QAC breakfast  . metoprolol succinate  100 mg Oral Daily  . pantoprazole  40 mg Oral Daily  . predniSONE  50 mg Oral Q breakfast  . sodium chloride  3 mL Intravenous Q12H   Continuous Infusions:   Principal Problem:   Acute renal failure superimposed on stage 4 chronic kidney disease (HCC) Active Problems:   Hypothyroidism (acquired)   Diabetic neuropathy (HCC)   Uncontrolled diabetes mellitus type 2 with peripheral artery disease (HCC)   Acute on chronic renal failure (HCC)   Hx of gastroesophageal reflux (GERD)   Prolonged Q-T interval on ECG   AKI (acute kidney injury) (HCC)   Hypertensive urgency    Time spent: 30 min    Emily Massar  [facility] Pager [phone]. If 7PM-7AM, please contact night-coverage at [email or web address], password [email or web address] 01/04/2015, 1:48 PM  LOS: 5 days

## 2015-01-04 NOTE — Progress Notes (Signed)
Results for [name], Russell [name] (MRN [id number]) as of 01/04/2015 19:43  Ref. Range 01/04/2015 19:16  Glucose-Capillary Latest Ref Range: 65-99 mg/dL 409533 (H)  NP notified with new order.

## 2015-01-04 NOTE — Progress Notes (Addendum)
[facility] Rounding Note  Subjective:  Diuretics have been held for 2 days - creatinine continues to rise, uop dropping off I think needs to start HD - patient WANTS to start "Tired of being swollen and SOB" Mother and fiancee in the room with him   [facility] Weights   01/01/15 2034 01/02/15 0840 01/02/15 2024  Weight: 122.471 kg (270 lb) 119.4 kg (263 lb 3.7 oz) 119.3 kg (263 lb 0.1 oz)    Physical Exam:  BP 134/74 mmHg  Pulse 93  Temp(Src) 98 F (36.7 C) (Oral)  Resp 20  Ht 6\' 2"  (1.88 m)  Wt 119.3 kg (263 lb 0.1 oz)  BMI 33.75 kg/m2  SpO2 97% NAD Puffy pale WM, good spirits but "ready to get the show on the road" Ant clear but exp wheezing. + sacral, post thigh and LE pitting edema L RC AVF +B/T; incision CDI   Scheduled Meds: . arformoterol  15 mcg Nebulization BID  . budesonide (PULMICORT) nebulizer solution  0.5 mg Nebulization BID  . calcium carbonate  400 mg of elemental calcium Oral TID WC  . citalopram  40 mg Oral Daily  . cloNIDine  0.1 mg Oral BID  . cyclobenzaprine  10 mg Oral QHS  . doxycycline  100 mg Oral Q12H  . gabapentin  400 mg Oral QHS  . heparin  5,000 Units Subcutaneous 3 times per day  . insulin aspart  0-5 Units Subcutaneous QHS  . insulin aspart  0-9 Units Subcutaneous TID WC  . insulin aspart  6 Units Subcutaneous TID WC  . insulin glargine  35 Units Subcutaneous Daily  . levothyroxine  50 mcg Oral QAC breakfast  . metoprolol succinate  100 mg Oral Daily  . pantoprazole  40 mg Oral Daily  . predniSONE  50 mg Oral Q breakfast  . 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 I think we should proceed with HD. Patient desires to do so.  Will need [facility] - spoke with Dr. Imogene Burn who will put him on the OR schedule for tomorrow - then HD after, start CLIP process                    2. Anasarca / Hypervolemia - HD will take care of this  3. Hypoalbuminemia 4. Anemia, mild, normocytic; not req ESA yet 5. Hyperphosphatemia - on Caco3. Change to ca acetate. Increase dose. 6. HTN on CCB, clonidine, hydralazine; stable currenlty 7. DM2 with recent A1c 8.5% 8. Hx/o DVT completed AC 9. S/p L RC AVF 01/01/2015  Camille Bal, MD [facility] [phone] Pager 01/04/2015, 12:42  PM

## 2015-01-04 NOTE — Progress Notes (Signed)
In  and out cath done using sterile technique. Tolerated  Procedure well.  Output 600 of amber color, clear and odorless urine.

## 2015-01-04 NOTE — Progress Notes (Signed)
Inpatient Diabetes Program Recommendations  AACE/ADA: New Consensus Statement on Inpatient Glycemic Control (2015)  Target Ranges:  Prepandial:   less than 140 mg/dL      Peak postprandial:   less than 180 mg/dL (1-2 hours)      Critically ill patients:  140 - 180 mg/dL    Results for Russell [name], Russell [name] (MRN [id number]) as of 01/04/2015 11:53  Ref. Range 01/03/2015 07:48 01/03/2015 12:05 01/03/2015 20:39  Glucose-Capillary Latest Ref Range: 65-99 mg/dL 782171 (H) 956105 (H) 213109 (H)    Results for Russell [name], Russell [name] (MRN [id number]) as of 01/04/2015 11:53  Ref. Range 01/04/2015 07:44 01/04/2015 11:38  Glucose-Capillary Latest Ref Range: 65-99 mg/dL 629292 (H) 528330 (H)    Current Insulin Orders: Lantus 35 units daily      Novolog Sensitive SSI (0-9 units) TID AC + HS      Novolog 6 units tidwc      MD- Note patient was started on Prednisone 50 mg daily last evening.  CBGs so far today gave been very elevated likely due to Prednisone.  Please consider the following in-hospital insulin adjustments while patient getting Prednsione:  1. Increase Lantus to 42 units daily (20% increase)  2. Increase Novolog Meal Coverage to 8 units tidwc     --Will follow patient during hospitalization--  Ambrose FinlandJeannine Johnston Teven Mittman RN, MSN, CDE Diabetes Coordinator Inpatient Glycemic Control Team Team Pager: [phone] (8a-5p)

## 2015-01-05 ENCOUNTER — Encounter (HOSPITAL_COMMUNITY)
Admission: AD | Disposition: A | Discharge status: Home / Self Care | Payer: Self-pay | Source: Ambulatory Visit | Attending: Internal Medicine

## 2015-01-05 ENCOUNTER — Inpatient Hospital Stay (HOSPITAL_COMMUNITY): Payer: Managed Care, Other (non HMO) | Admitting: Certified Registered Nurse Anesthetist

## 2015-01-05 ENCOUNTER — Encounter (HOSPITAL_COMMUNITY): Payer: Self-pay | Admitting: Anesthesiology

## 2015-01-05 ENCOUNTER — Inpatient Hospital Stay (HOSPITAL_COMMUNITY): Payer: Managed Care, Other (non HMO)

## 2015-01-05 ENCOUNTER — Other Ambulatory Visit: Payer: Managed Care, Other (non HMO) | Admitting: *Deleted

## 2015-01-05 DIAGNOSIS — N185 Chronic kidney disease, stage 5: Secondary | ICD-10-CM

## 2015-01-05 DIAGNOSIS — Z4931 Encounter for adequacy testing for hemodialysis: Secondary | ICD-10-CM

## 2015-01-05 DIAGNOSIS — N186 End stage renal disease: Secondary | ICD-10-CM

## 2015-01-05 HISTORY — PX: INSERTION OF DIALYSIS CATHETER: SHX1324

## 2015-01-05 LAB — GLUCOSE, CAPILLARY
GLUCOSE-CAPILLARY: 251 mg/dL — AB (ref 65–99)
GLUCOSE-CAPILLARY: 271 mg/dL — AB (ref 65–99)
GLUCOSE-CAPILLARY: 283 mg/dL — AB (ref 65–99)
GLUCOSE-CAPILLARY: 318 mg/dL — AB (ref 65–99)
GLUCOSE-CAPILLARY: 360 mg/dL — AB (ref 65–99)
Glucose-Capillary: 268 mg/dL — ABNORMAL HIGH (ref 65–99)
Glucose-Capillary: 293 mg/dL — ABNORMAL HIGH (ref 65–99)

## 2015-01-05 LAB — CBC
HCT: 28.8 % — ABNORMAL LOW (ref 39.0–52.0)
HEMOGLOBIN: 9.5 g/dL — AB (ref 13.0–17.0)
MCH: 28.2 pg (ref 26.0–34.0)
MCHC: 33 g/dL (ref 30.0–36.0)
MCV: 85.5 fL (ref 78.0–100.0)
Platelets: 281 10*3/uL (ref 150–400)
RBC: 3.37 MIL/uL — ABNORMAL LOW (ref 4.22–5.81)
RDW: 13.8 % (ref 11.5–15.5)
WBC: 11.2 10*3/uL — ABNORMAL HIGH (ref 4.0–10.5)

## 2015-01-05 LAB — RENAL FUNCTION PANEL
ALBUMIN: 2.4 g/dL — AB (ref 3.5–5.0)
ANION GAP: 11 (ref 5–15)
BUN: 112 mg/dL — AB (ref 6–20)
CALCIUM: 8.4 mg/dL — AB (ref 8.9–10.3)
CO2: 21 mmol/L — AB (ref 22–32)
CREATININE: 7.88 mg/dL — AB (ref 0.61–1.24)
Chloride: 101 mmol/L (ref 101–111)
GFR calc Af Amer: 9 mL/min — ABNORMAL LOW (ref >60)
GFR calc non Af Amer: 7 mL/min — ABNORMAL LOW (ref >60)
GLUCOSE: 327 mg/dL — AB (ref 65–99)
PHOSPHORUS: 7.9 mg/dL — AB (ref 2.5–4.6)
Potassium: 5 mmol/L (ref 3.5–5.1)
SODIUM: 133 mmol/L — AB (ref 135–145)

## 2015-01-05 SURGERY — INSERTION OF DIALYSIS CATHETER
Anesthesia: Monitor Anesthesia Care | Site: Neck | Laterality: Right

## 2015-01-05 MED ORDER — MEPERIDINE HCL 25 MG/ML IJ SOLN: 6.2500 mg | INTRAMUSCULAR | Status: DC | PRN

## 2015-01-05 MED ORDER — LACTATED RINGERS IV SOLN: INTRAVENOUS | Status: DC

## 2015-01-05 MED ORDER — INSULIN ASPART 100 UNIT/ML ~~LOC~~ SOLN
10.0000 [IU] | Freq: Three times a day (TID) | SUBCUTANEOUS | Status: DC
  Administered 2015-01-05 – 2015-01-08 (×8): 10 [IU] via SUBCUTANEOUS

## 2015-01-05 MED ORDER — PROMETHAZINE HCL 25 MG/ML IJ SOLN: 6.2500 mg | INTRAMUSCULAR | Status: DC | PRN

## 2015-01-05 MED ORDER — PROPOFOL 500 MG/50ML IV EMUL
INTRAVENOUS | Status: DC | PRN
  Administered 2015-01-05: 30 ug/kg/min via INTRAVENOUS

## 2015-01-05 MED ORDER — SUCCINYLCHOLINE CHLORIDE 20 MG/ML IJ SOLN
INTRAMUSCULAR | Status: AC
  Filled 2015-01-05: qty 1

## 2015-01-05 MED ORDER — LIDOCAINE HCL (CARDIAC) 20 MG/ML IV SOLN
INTRAVENOUS | Status: AC
  Filled 2015-01-05: qty 5

## 2015-01-05 MED ORDER — PROPOFOL 500 MG/50ML IV EMUL: INTRAVENOUS | Status: DC | PRN

## 2015-01-05 MED ORDER — LIDOCAINE HCL (PF) 1 % IJ SOLN
INTRAMUSCULAR | Status: DC | PRN
  Administered 2015-01-05: 16 mL

## 2015-01-05 MED ORDER — FENTANYL CITRATE (PF) 250 MCG/5ML IJ SOLN
INTRAMUSCULAR | Status: DC | PRN
  Administered 2015-01-05: 50 ug via INTRAVENOUS

## 2015-01-05 MED ORDER — SODIUM CHLORIDE 0.9 % IV SOLN
INTRAVENOUS | Status: DC | PRN
  Administered 2015-01-05: 500 mL

## 2015-01-05 MED ORDER — HEPARIN SODIUM (PORCINE) 1000 UNIT/ML IJ SOLN
INTRAMUSCULAR | Status: AC
  Filled 2015-01-05: qty 1

## 2015-01-05 MED ORDER — MIDAZOLAM HCL 2 MG/2ML IJ SOLN
INTRAMUSCULAR | Status: AC
  Filled 2015-01-05: qty 2

## 2015-01-05 MED ORDER — SODIUM CHLORIDE 0.9 % IV SOLN
INTRAVENOUS | Status: DC | PRN
  Administered 2015-01-05: 10:00:00 via INTRAVENOUS

## 2015-01-05 MED ORDER — CEFAZOLIN SODIUM-DEXTROSE 2-3 GM-% IV SOLR
INTRAVENOUS | Status: AC
  Administered 2015-01-05: 2 g via INTRAVENOUS
  Filled 2015-01-05: qty 50

## 2015-01-05 MED ORDER — SODIUM CHLORIDE 0.9 % IJ SOLN
INTRAMUSCULAR | Status: AC
  Filled 2015-01-05: qty 10

## 2015-01-05 MED ORDER — FENTANYL CITRATE (PF) 100 MCG/2ML IJ SOLN: 25.0000 ug | INTRAMUSCULAR | Status: DC | PRN

## 2015-01-05 MED ORDER — FENTANYL CITRATE (PF) 250 MCG/5ML IJ SOLN
INTRAMUSCULAR | Status: AC
  Filled 2015-01-05: qty 5

## 2015-01-05 MED ORDER — EPHEDRINE SULFATE 50 MG/ML IJ SOLN
INTRAMUSCULAR | Status: AC
  Filled 2015-01-05: qty 1

## 2015-01-05 MED ORDER — HEPARIN SODIUM (PORCINE) 1000 UNIT/ML IJ SOLN
INTRAMUSCULAR | Status: DC | PRN
  Administered 2015-01-05: 4.6 mL via INTRAVENOUS

## 2015-01-05 MED ORDER — LIDOCAINE HCL (PF) 1 % IJ SOLN
INTRAMUSCULAR | Status: AC
  Filled 2015-01-05: qty 30

## 2015-01-05 MED ORDER — PROPOFOL 10 MG/ML IV BOLUS
INTRAVENOUS | Status: AC
  Filled 2015-01-05: qty 20

## 2015-01-05 MED ORDER — MIDAZOLAM HCL 2 MG/2ML IJ SOLN
INTRAMUSCULAR | Status: DC | PRN
  Administered 2015-01-05 (×2): 1 mg via INTRAVENOUS

## 2015-01-05 MED ORDER — PREDNISONE 20 MG PO TABS
40.0000 mg | ORAL_TABLET | Freq: Every day | ORAL | Status: DC
  Administered 2015-01-05: 40 mg via ORAL
  Filled 2015-01-05: qty 2

## 2015-01-05 MED ORDER — PREDNISONE 20 MG PO TABS
20.0000 mg | ORAL_TABLET | Freq: Every day | ORAL | Status: AC
  Administered 2015-01-06 – 2015-01-07 (×2): 20 mg via ORAL
  Filled 2015-01-05 (×2): qty 1

## 2015-01-05 SURGICAL SUPPLY — 49 items
BAG BANDED W/RUBBER/TAPE 36X54 (MISCELLANEOUS) ×2 IMPLANT
BAG DECANTER FOR FLEXI CONT (MISCELLANEOUS) ×3 IMPLANT
BAG EQP BAND 135X91 W/RBR TAPE (MISCELLANEOUS) ×1
BIOPATCH RED 1 DISK 7.0 (GAUZE/BANDAGES/DRESSINGS) ×2 IMPLANT
BIOPATCH RED 1IN DISK 7.0MM (GAUZE/BANDAGES/DRESSINGS) ×1
CATH CANNON HEMO 15FR 23CM (HEMODIALYSIS SUPPLIES) ×2 IMPLANT
CATH PALINDROME RT-P 15FX19CM (CATHETERS) IMPLANT
CATH PALINDROME RT-P 15FX23CM (CATHETERS) IMPLANT
CATH PALINDROME RT-P 15FX28CM (CATHETERS) IMPLANT
CATH PALINDROME RT-P 15FX55CM (CATHETERS) IMPLANT
CATH STRAIGHT 5FR 65CM (CATHETERS) IMPLANT
COVER DOME SNAP 22 D (MISCELLANEOUS) ×2 IMPLANT
COVER PROBE W GEL 5X96 (DRAPES) ×3 IMPLANT
DRAPE C-ARM 42X72 X-RAY (DRAPES) ×1 IMPLANT
DRAPE CHEST BREAST 15X10 FENES (DRAPES) ×3 IMPLANT
GAUZE SPONGE 2X2 8PLY STRL LF (GAUZE/BANDAGES/DRESSINGS) ×1 IMPLANT
GAUZE SPONGE 4X4 16PLY XRAY LF (GAUZE/BANDAGES/DRESSINGS) ×3 IMPLANT
GLOVE BIO SURGEON STRL SZ7 (GLOVE) ×5 IMPLANT
GLOVE BIOGEL PI IND STRL 6.5 (GLOVE) IMPLANT
GLOVE BIOGEL PI IND STRL 7.5 (GLOVE) ×1 IMPLANT
GLOVE BIOGEL PI INDICATOR 6.5 (GLOVE) ×2
GLOVE BIOGEL PI INDICATOR 7.5 (GLOVE) ×2
GOWN STRL REUS W/ TWL LRG LVL3 (GOWN DISPOSABLE) ×2 IMPLANT
GOWN STRL REUS W/ TWL XL LVL3 (GOWN DISPOSABLE) IMPLANT
GOWN STRL REUS W/TWL LRG LVL3 (GOWN DISPOSABLE) ×3
GOWN STRL REUS W/TWL XL LVL3 (GOWN DISPOSABLE) ×3
KIT BASIN OR (CUSTOM PROCEDURE TRAY) ×3 IMPLANT
KIT ROOM TURNOVER OR (KITS) ×3 IMPLANT
LIQUID BAND (GAUZE/BANDAGES/DRESSINGS) ×2 IMPLANT
NDL 18GX1X1/2 (RX/OR ONLY) (NEEDLE) ×1 IMPLANT
NDL HYPO 25GX1X1/2 BEV (NEEDLE) ×1 IMPLANT
NEEDLE 18GX1X1/2 (RX/OR ONLY) (NEEDLE) ×3 IMPLANT
NEEDLE HYPO 25GX1X1/2 BEV (NEEDLE) ×3 IMPLANT
NS IRRIG 1000ML POUR BTL (IV SOLUTION) ×1 IMPLANT
PACK SURGICAL SETUP 50X90 (CUSTOM PROCEDURE TRAY) ×3 IMPLANT
PAD ARMBOARD 7.5X6 YLW CONV (MISCELLANEOUS) ×6 IMPLANT
SET MICROPUNCTURE 5F STIFF (MISCELLANEOUS) IMPLANT
SOAP 2 % CHG 4 OZ (WOUND CARE) ×3 IMPLANT
SPONGE GAUZE 2X2 STER 10/PKG (GAUZE/BANDAGES/DRESSINGS) ×2
SUT ETHILON 3 0 PS 1 (SUTURE) ×3 IMPLANT
SUT MNCRL AB 4-0 PS2 18 (SUTURE) ×3 IMPLANT
SYR 20CC LL (SYRINGE) ×6 IMPLANT
SYR 3ML LL SCALE MARK (SYRINGE) ×1 IMPLANT
SYR 5ML LL (SYRINGE) ×1 IMPLANT
SYR CONTROL 10ML LL (SYRINGE) ×3 IMPLANT
SYRINGE 10CC LL (SYRINGE) ×3 IMPLANT
TAPE CLOTH SURG 4X10 WHT LF (GAUZE/BANDAGES/DRESSINGS) ×2 IMPLANT
WATER STERILE IRR 1000ML POUR (IV SOLUTION) ×3 IMPLANT
WIRE AMPLATZ SS-J .035X180CM (WIRE) IMPLANT

## 2015-01-05 NOTE — Anesthesia Preprocedure Evaluation (Addendum)
Anesthesia Evaluation  Patient identified by MRN, date of birth, ID band Patient awake    Reviewed: Allergy & Precautions, NPO status , Patient's Chart, lab work & pertinent test results  Airway Mallampati: II  TM Distance: >3 FB Neck ROM: Full    Dental  (+) Teeth Intact, Dental Advisory Given   Pulmonary asthma ,    breath sounds clear to auscultation       Cardiovascular hypertension, Pt. on medications + Peripheral Vascular Disease   Rhythm:Regular Rate:Normal     Neuro/Psych  Headaches, PSYCHIATRIC DISORDERS Anxiety Depression    GI/Hepatic negative GI ROS, Neg liver ROS,   Endo/Other  diabetes, Type 1, Insulin DependentHypothyroidism   Renal/GU CRFRenal disease  negative genitourinary   Musculoskeletal  (+) Arthritis ,   Abdominal   Peds negative pediatric ROS (+)  Hematology negative hematology ROS (+)   Anesthesia Other Findings   Reproductive/Obstetrics negative OB ROS                          Lab Results  Component Value Date   WBC 11.2* 01/05/2015   HGB 9.5* 01/05/2015   HCT 28.8* 01/05/2015   MCV 85.5 01/05/2015   PLT 281 01/05/2015   Lab Results  Component Value Date   CREATININE 7.88* 01/05/2015   BUN 112* 01/05/2015   NA 133* 01/05/2015   K 5.0 01/05/2015   CL 101 01/05/2015   CO2 21* 01/05/2015   Lab Results  Component Value Date   INR 1.43 09/23/2014   EKG: normal sinus rhythm.  Study Conclusions  - Left ventricle: The cavity size was normal. There was moderate concentric hypertrophy. Systolic function was normal. The estimated ejection fraction was in the range of 55% to 60%. Wall motion was normal; there were no regional wall motion abnormalities. The study is not technically sufficient to allow evaluation of LV diastolic function. - Left atrium: The atrium was normal in size.  Anesthesia Physical Anesthesia Plan  ASA: III  Anesthesia  Plan: MAC   Post-op Pain Management:    Induction: Intravenous  Airway Management Planned: Oral ETT  Additional Equipment:   Intra-op Plan:   Post-operative Plan: Extubation in OR  Informed Consent: I have reviewed the patients History and Physical, chart, labs and discussed the procedure including the risks, benefits and alternatives for the proposed anesthesia with the patient or authorized representative who has indicated his/her understanding and acceptance.     Plan Discussed with: CRNA  Anesthesia Plan Comments: (Possible LMA if patient has no issues with GERD.)       Anesthesia Quick Evaluation

## 2015-01-05 NOTE — Progress Notes (Signed)
[facility] Rounding Note  Subjective:  TDC today with plans for initiation of HD CLIP process initiated For HD today  Filed Weights   01/02/15 0840 01/02/15 2024 01/04/15 2050  Weight: 119.4 kg (263 lb 3.7 oz) 119.3 kg (263 lb 0.1 oz) 124.5 kg (274 lb 7.6 oz)    Physical Exam:  BP 130/96 mmHg  Pulse 78  Temp(Src) 97.2 F (36.2 C) (Oral)  Resp 12  Ht 6\' 2"  (1.88 m)  Wt 124.5 kg (274 lb 7.6 oz)  BMI 35.23 kg/m2  SpO2 96% NAD Puffy pale WM Generealized anasarca Ant clear but exp wheezing. + sacral, post thigh and LE pitting edema L RC AVF +B/T; incision CDI TDC R IJ (1/3)   Scheduled Meds: . arformoterol  15 mcg Nebulization BID  . budesonide (PULMICORT) nebulizer solution  0.5 mg Nebulization BID  . calcium acetate  1,334 mg Oral TID WC  . citalopram  40 mg Oral Daily  . cloNIDine  0.1 mg Oral BID  . cyclobenzaprine  10 mg Oral QHS  . doxycycline  100 mg Oral Q12H  . gabapentin  400 mg Oral QHS  . heparin  5,000 Units Subcutaneous 3 times per day  . insulin aspart  0-20 Units Subcutaneous TID WC  . insulin aspart  10 Units Subcutaneous TID WC  . insulin glargine  40 Units Subcutaneous Daily  . levothyroxine  50 mcg Oral QAC breakfast  . metoprolol succinate  100 mg Oral Daily  . pantoprazole  40 mg Oral Daily  . predniSONE  40 mg Oral Q breakfast  . sodium chloride  3 mL Intravenous Q12H   Continuous Infusions:  PRN Meds:.acetaminophen, albuterol, clonazePAM, hydrALAZINE, HYDROmorphone, oxyCODONE, promethazine    Recent Labs Lab 01/03/15 0319 01/04/15 0454 01/04/15 1951 01/05/15 0533  NA 137 136 131* 133*  K 4.5 5.2* 4.9 5.0  CL 103 103 101 101  CO2 22 21* 18* 21*  GLUCOSE 217* 301* 570* 327*  BUN 73* 93* 105* 112*  CREATININE 5.88* 6.70* 7.57* 7.88*  CALCIUM 8.0* 8.4* 8.4* 8.4*  PHOS 9.1* 7.4*  --  7.9*    Recent Labs Lab 12/30/14 1548 01/01/15 1120 01/05/15 0533  WBC 7.5 5.7 11.2*  NEUTROABS 5.4  --   --   HGB 10.9* 11.0*  9.5*  HCT 32.5* 32.3* 28.8*  MCV 84.0 85.2 85.5  PLT 243 243 281   26M with Nephrotic Syndrome and CKD4 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 NEW ESRD.  We are proceeding with HD - 1st tmt today, 2nd tomorrow. CLIP process started.  2. Anasarca / Hypervolemia - HD will take care of this  3. Hypoalbuminemia 4. Anemia, mild, normocytic. Hb today <10. Has varied some. If <10 tomorrow will start Aranesp 5. Hyperphosphatemia - Changed to ca acetate. Increased dose. 6. HTN on CCB, clonidine, hydralazine; stable currenlty 7. DM2 with recent A1c 8.5% 8. Hx/o DVT completed AC 9. S/p L RC AVF 01/01/2015  Camille [name] Sherod Cisse, MD [facility] [phone] Pager 01/05/2015, 12:49 PM

## 2015-01-05 NOTE — Anesthesia Postprocedure Evaluation (Signed)
Anesthesia Post Note  Patient: Russell [name]  Procedure(s) Performed: Procedure(s) (LRB): INSERTION OF RIGHT INTERNAL JUGULAR DIALYSIS CATHETER (Right)  Patient location during evaluation: PACU Anesthesia Type: MAC Level of consciousness: awake and alert Pain management: pain level controlled Vital Signs Assessment: post-procedure vital signs reviewed and stable Respiratory status: spontaneous breathing, nonlabored ventilation, respiratory function stable and patient connected to nasal cannula oxygen Cardiovascular status: stable and blood pressure returned to baseline Anesthetic complications: no    Last Vitals:  Filed Vitals:   01/05/15 1409 01/05/15 1430  BP: 163/95 153/90  Pulse: 83 81  Temp: 35.9 C   Resp: 11 12    Last Pain:  Filed Vitals:   01/05/15 1437  PainSc: 10-Worst pain ever                 Shelton SilvasKevin D Jago Carton

## 2015-01-05 NOTE — Progress Notes (Signed)
[facility] PROGRESS NOTE  Russell BirkenheadSteven B [name] ZOX:[id number] DOB: 03/07/1970 DOA: 12/30/2014 PCP: Elvina SidleLAUENSTEIN,KURT, MD  Brief Summary  The patient is a 45 y.o. 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 He has had progressive kidney disease with 4 hospitalizations in the last 6 months for uncontrolled diabetes, nausea, vomiting, hypertensive urgency, and AKI.   Assessment/Plan  ESRD, creatinine trending up, uop decreasing, intermittent hyperkalemia, and developing uremic symptoms -  Vascular access and starting HD on 1/3 -  CLIP process started -  Appreciate nephrology and vascular surgery assistance - Renal diet  Hypertensive urgency, BP much better with clonidine.  Hydralazine did not help much. -  continue Metoprolol 100 mg PO ER  -  Continue Clonidine 0.1mg  TID - BP may trend down on HD  Acute COPD exacerbation.  Mixed restrictive/obstructive pulmonary defect (per outpatient PFT done at [facility]). I think that a lot of his wheezing is due to edema from renal failure and less from his lung disease.  - CXR with some interstitial edema today  -  Continue budesonide/brovana -  D/c duoneb 2/2 urinary retention -  Start albuterol monotherapy -  Quick prednisone taper and doxycycline day 3 of 3  Uncontrolled diabetes mellitus type 2 with peripheral artery disease, diabetic retinopathy, and neuropathy, CBG elevated 2/2 steroids - increase lantus to 40 units - increase to aspart  10 units with meals -  Increase to high dose SSI -  A1c 8.5 -  Will need to cancel 1/6 eye appointment  Obesity, needs weight loss  History of superficial venous thrombosis, H/o 01/2014  (no evident of DVT), Hematology Dr. Merlene Pullingorcoran discontinued anticoagulation since hypercoagulable panel was negative and the patient had been more active  Diabetic neuropathy, stable, continue Gabapentin  Hypothyroidism (acquired), stable, continue Synthroid   Erosive esophagitis, stable, continue protonix  Depression/anxiety, stable, continue celexa. Patient on two benzodiazepines at home > suggested narrowing to 1 -  continue clonazepam for now and hold xanax.   NSVT, stable, hx of prolonged QTc -  Keep electrolytes wnl -  ECHO from earlier this year without valvular dysfunction.  Preserved EF -  Avoid QTc prolonging medications  Anemia or renal disease.  Iron, b12, folate wnl.  TSH is mildly elevated in setting of acute illness but not likely responsible for anemia  Hypothryoidism, question of medication compliance and also checked in setting of acute illness -  Will continue current dose and have MD repeat TSH in 2-4 weeks  Elevated phos, per nephrology  Diet:  Renal  Access:  PIV IVF:  off Proph:  heparin  Code Status: full Family Communication: Patient alone Disposition Plan:  Starting HD.  CLIP process started   Consultants:  Nephrology  Vascular surgery  Procedures:  Fistula placed on 12/30   Antibiotics:  None   HPI/Subjective:  Nausea without vomiting.  Swelling stable.  Wheezing is worse despite steroids and antibiotics and he is having more orthopnea.    Objective: Filed Vitals:   01/05/15 1030 01/05/15 1045 01/05/15 1100 01/05/15 1409  BP:   130/96 163/95  Pulse: 79 77 78 83  Temp:    96.6 F (35.9  C)  TempSrc:    Oral  Resp: 10 10 12 11   Height:      Weight:    128.3 kg (282 lb 13.6 oz)  SpO2: 97% 97% 96%     Intake/Output Summary (Last 24 hours)  at 01/05/15 1428 Last data filed at 01/05/15 0853  Gross per 24 hour  Intake    240 ml  Output    950 ml  Net   -710 ml   Filed Weights   01/02/15 2024 01/04/15 2050 01/05/15 1409  Weight: 119.3 kg (263 lb 0.1 oz) 124.5 kg (274 lb 7.6 oz) 128.3 kg (282 lb 13.6 oz)   Body mass index is 36.3 kg/(m^2).  Exam:   General:  Obese male, NAD  HEENT:  NCAT, MMM, face very edematous today  Cardiovascular:  RRR, nl S1, S2 no mrg, 2+ pulses, warm extremities  Respiratory:  Wheezy, diminished at bilateral bases, no rales or rhonchi  Abdomen:   NABS, soft, NT/ND  MSK:   Normal tone and bulk, 2+ pitting bilateral LEE    Data Reviewed: Basic Metabolic Panel:  Recent Labs Lab 12/30/14 1548  01/01/15 1120 01/02/15 0550 01/03/15 0319 01/04/15 0454 01/04/15 1951 01/05/15 0533  NA 138  < > 138  138 138 137 136 131* 133*  K 4.6  < > 4.0  3.9 4.0 4.5 5.2* 4.9 5.0  CL 108  < > 105  104 106 103 103 101 101  CO2 21*  < > 24  24 25 22  21* 18* 21*  GLUCOSE 348*  < > 197*  196* 200* 217* 301* 570* 327*  BUN 47*  < > 54*  55* 62* 73* 93* 105* 112*  CREATININE 4.06*  < > 4.57*  4.59* 5.28* 5.88* 6.70* 7.57* 7.88*  CALCIUM 7.4*  < > 8.2*  8.2* 8.1* 8.0* 8.4* 8.4* 8.4*  MG 1.5*  --   --   --   --   --   --   --   PHOS 5.4*  < > 7.3* 8.1* 9.1* 7.4*  --  7.9*  < > = values in this interval not displayed. Liver Function Tests:  Recent Labs Lab 12/30/14 1548  01/01/15 1120 01/02/15 0550 01/03/15 0319 01/04/15 0454 01/05/15 0533  AST 17  --   --   --   --   --   --   ALT 16*  --   --   --   --   --   --   ALKPHOS 74  --   --   --   --   --   --   BILITOT 0.7  --   --   --   --   --   --   PROT 4.5*  --   --   --   --   --   --   ALBUMIN 1.9*  < > 1.9* 1.8* 1.8* 2.0* 2.4*  < > = values in this interval not displayed. No results for input(s): LIPASE, AMYLASE in the last 168 hours. No results for input(s): AMMONIA in the last 168 hours. CBC:  Recent Labs Lab 12/30/14 1548  01/01/15 1120 01/05/15 0533  WBC 7.5 5.7 11.2*  NEUTROABS 5.4  --   --   HGB 10.9* 11.0* 9.5*  HCT 32.5* 32.3* 28.8*  MCV 84.0 85.2 85.5  PLT 243 243 281    No results found for this or any previous visit (from the past 240 hour(s)).  Studies: Dg Chest Port 1 View  01/05/2015  CLINICAL DATA:  Dialysis catheter insertion EXAM: PORTABLE CHEST 1 VIEW COMPARISON:  12/30/2014 FINDINGS: Slight elevation of the right hemidiaphragm. Low lung volumes. Right dialysis catheter tips are in the right atrium. No pneumothorax. Mild vascular congestion and interstitial prominence which could be mild interstitial edema. Heart is borderline in size. IMPRESSION: Low lung volumes. Interstitial prominence could reflect interstitial edema. Borderline heart size. Electronically Signed   By: Charlett Nose M.D.   On: 01/05/2015 11:15    Scheduled Meds: . arformoterol  15 mcg Nebulization BID  . budesonide (PULMICORT) nebulizer solution  0.5 mg Nebulization BID  . calcium acetate  1,334 mg Oral TID WC  . citalopram  40 mg Oral Daily  . cloNIDine  0.1 mg Oral BID  . cyclobenzaprine  10 mg Oral QHS  . doxycycline  100 mg Oral Q12H  . gabapentin  400 mg Oral QHS  . heparin  5,000 Units Subcutaneous 3 times per day  . insulin aspart  0-20 Units Subcutaneous TID WC  . insulin aspart  10 Units Subcutaneous TID WC  . insulin glargine  40 Units Subcutaneous Daily  . levothyroxine  50 mcg Oral QAC breakfast  . metoprolol succinate  100 mg Oral Daily  . pantoprazole  40 mg Oral Daily  . predniSONE  40 mg Oral Q breakfast  . sodium chloride  3 mL Intravenous Q12H   Continuous Infusions:   Principal Problem:   Acute renal failure superimposed on stage 4 chronic kidney disease (HCC) Active Problems:   Hypothyroidism (acquired)   Diabetic neuropathy (HCC)   Uncontrolled diabetes mellitus type 2 with peripheral artery disease (HCC)   Acute on chronic renal failure (HCC)   Hx of gastroesophageal reflux (GERD)    Prolonged Q-T interval on ECG   AKI (acute kidney injury) (HCC)   Hypertensive urgency    Time spent: 30 min    Kitty Cadavid  [facility] Pager [phone]. If 7PM-7AM, please contact night-coverage at [email or web address], password [facility] 01/05/2015, 2:28 PM  LOS: 6 days

## 2015-01-05 NOTE — Anesthesia Procedure Notes (Signed)
Procedure Name: MAC Date/Time: 01/05/2015 9:57 AM Performed by: Edmonia CaprioAUSTON, Russell [name] Pre-anesthesia Checklist: Patient identified, Emergency Drugs available, Suction available, Patient being monitored and Timeout performed Patient Re-evaluated:Patient Re-evaluated prior to inductionOxygen Delivery Method: Simple face mask Intubation Type: IV induction

## 2015-01-05 NOTE — Op Note (Signed)
    OPERATIVE NOTE  PROCEDURE: 1. Right internal jugular vein tunneled dialysis catheter placement 2. Right internal jugular vein cannulation under ultrasound guidance  PRE-OPERATIVE DIAGNOSIS: end-stage renal failure  POST-OPERATIVE DIAGNOSIS: same as above  SURGEON: Leonides SakeBrian Chen, MD  ANESTHESIA: local and MAC  ESTIMATED BLOOD LOSS: 30 cc  FINDING(S): 1.  Tips of the catheter in the right atrium on fluoroscopy 2.  No obvious pneumothorax on fluoroscopy  SPECIMEN(S):  none  INDICATIONS:   Russell BirkenheadSteven B Colomb is a 45 y.o. male who presents with end stage renal disease.  The patient presents for tunneled dialysis catheter placement.  The patient is aware the risks of tunneled dialysis catheter placement include but are not limited to: bleeding, infection, central venous injury, pneumothorax, possible venous stenosis, possible malpositioning in the venous system, and possible infections related to long-term catheter presence.  The patient was aware of these risks and agreed to proceed.  DESCRIPTION: After written full informed consent was obtained from the patient, the patient was taken back to the operating room.  Prior to induction, the patient was given IV antibiotics.  After obtaining adequate sedation, the patient was prepped and draped in the standard fashion for a chest or neck tunneled dialysis catheter placement.   The cannulation site, the catheter exit site, and tract for the subcutaneous tunnel were then anesthestized with a total of 15 cc of 1% lidocaine without epinephrine.  Under ultrasound guidance, the right internal jugular vein was cannulated with the 18 gauge needle.  A J-wire was then placed down into the inferior vena cava under fluoroscopic guidance.  The wire was then secured in place with a clamp to the drapes.  I then made stab incisions at the neck and exit sites.   I dissected from the exit site to the cannulation site with a metal tunneler.   The subcutaneous tunnel  was dilated by passing a plastic dilator over the metal dissector. The wire was then unclamped and I removed the needle.  The skin tract and venotomy was dilated serially with dilators.  Finally, the dilator-sheath was placed under fluoroscopic guidance into the superior vena cava.  The dilator and wire were removed.  A 23 cm Diatek catheter was placed under fluoroscopic guidance down into the right atrium.  The sheath was broken and peeled away while holding the catheter cuff at the level of the skin.  The back end of this catheter was transected, revealing the two lumens of this catheter.  The ports were docked onto these two lumens.  The catheter hub was then screwed into place.  Each port was tested by aspirating and flushing.  No resistance was noted.  Each port was then thoroughly flushed with heparinized saline.  The catheter was secured in placed with two interrupted stitches of 3-0 Nylon tied to the catheter.  The neck incision was closed with a U-stitch of 4-0 Monocryl.  The neck and chest incision were cleaned and sterile bandages applied.  Each port was then loaded with concentrated heparin (1000 Units/mL) at the manufacturer recommended volumes to each port.  Sterile caps were applied to each port.  On completion fluoroscopy, the tips of the catheter were in the right atrium, and there was no evidence of pneumothorax.   COMPLICATIONS: none  CONDITION: stable   Leonides SakeBrian Chen, MD [facility] [place] Office: [phone] Pager: [phone]  01/05/2015, 10:19 AM

## 2015-01-05 NOTE — Plan of Care (Signed)
Problem: Food- and Nutrition-Related Knowledge Deficit (NB-1.1) Goal: Nutrition education Formal process to instruct or train a patient/client in a skill or to impart knowledge to help patients/clients voluntarily manage or modify food choices and eating behavior to maintain or improve health. Outcome: Completed/Met Date Met:  01/05/15 Nutrition Education Note  RD consulted for diet education. Provided "Eating Healthy with Kidney Disease" and "MyPlate Method Eating" handouts to patient/family. Reviewed food groups and provided written recommended serving sizes specifically determined for patient's current nutritional status.   Explained why diet restrictions are needed and provided lists of foods to limit/avoid that are high potassium, sodium, and phosphorus. Provided specific recommendations on safer alternatives of these foods. Strongly encouraged compliance of this diet.   Discussed importance of protein intake at each meal and snack. Provided examples of how to maximize protein intake throughout the day. Discussed need for fluid restriction with dialysis and renal-friendly beverage options. Discussed the importance of monitoring portion sizes at meals/snacks. Teach back method used.  Expect good compliance.  Body mass index is 35.23 kg/(m^2). Pt meets criteria for class II obesity based on current BMI.  Current diet order is renal/carb modified, patient is consuming approximately 100% of meals at this time. Labs and medications reviewed. No further nutrition interventions warranted at this time. RD contact information provided. If additional nutrition issues arise, please re-consult RD.  Corrin Parker, MS, RD, LDN Pager # [phone] After hours/ weekend pager # [phone]

## 2015-01-05 NOTE — Transfer of Care (Signed)
Immediate Anesthesia Transfer of Care Note  Patient: Russell [name]  Procedure(s) Performed: Procedure(s): INSERTION OF RIGHT INTERNAL JUGULAR DIALYSIS CATHETER (Right)  Patient Location: PACU  Anesthesia Type:MAC  Level of Consciousness: sedated  Airway & Oxygen Therapy: Patient Spontanous Breathing  Post-op Assessment: Report given to RN and Post -op Vital signs reviewed and stable  Post vital signs: Reviewed and stable  Last Vitals:  Filed Vitals:   01/05/15 0800 01/05/15 1029  BP: 154/83 138/86  Pulse: 68 80  Temp: 36.5 C 36.2 C  Resp: 18 10    Complications: No apparent anesthesia complications

## 2015-01-05 NOTE — Procedures (Signed)
I have personally attended this patient's dialysis session.  HD #1 for new ESRD New R IJ TDC today by Dr. Imogene Burnhen 2K /2.25 Ca bath 250/500 3 hours/3 liters Tight heparin  Camille [name] Launa Goedken, MD [facility] [phone] Pager 01/05/2015, 4:49 PM

## 2015-01-05 NOTE — Progress Notes (Signed)
Inpatient Diabetes Program Recommendations  AACE/ADA: New Consensus Statement on Inpatient Glycemic Control (2015)  Target Ranges:  Prepandial:   less than 140 mg/dL      Peak postprandial:   less than 180 mg/dL (1-2 hours)      Critically ill patients:  140 - 180 mg/dL    Results for [name], Jsaon B (MRN [id number]) as of 01/05/2015 09:31  Ref. Range 01/04/2015 07:44 01/04/2015 11:38 01/04/2015 17:06 01/04/2015 19:16 01/04/2015 20:49  Glucose-Capillary Latest Ref Range: 65-99 mg/dL 782292 (H) 956330 (H) 213454 (H) 533 (H) 475 (H)    Results for [name], Antoney B (MRN [id number]) as of 01/05/2015 09:31  Ref. Range 01/05/2015 00:04 01/05/2015 04:01 01/05/2015 07:43 01/05/2015 09:17  Glucose-Capillary Latest Ref Range: 65-99 mg/dL 629360 (H) 528318 (H) 413268 (H) 271 (H)    Current Insulin Orders: Lantus 40 units daily  Novolog Resistant SSI (0-20 units) TID AC + HS  Novolog 10 units tidwc      MD- Note patient was started on Prednisone 50 mg daily on the evening of 01/03/15. CBGs so far have been very elevated likely due to Prednisone.  Patient required several large doses of Novolog last evening for CBGs of 400-500s.  Prednisone decreased to 40 mg daily today.  Note Lantus increased to 40 units daily today (will get increased dose today)  Also note Novolog SSI increased to Resistant scale last PM and Novolog Meal Coverage increased to 10 units tidwc today.    --Will follow patient during hospitalization--  Ambrose FinlandJeannine Johnston Teasha Murrillo RN, MSN, CDE Diabetes Coordinator Inpatient Glycemic Control Team Team Pager: [phone] (8a-5p)

## 2015-01-05 NOTE — Interval H&P Note (Signed)
[facility]  History and Physical Update  The patient was interviewed and re-examined.  The patient's previous History and Physical has been reviewed and is unchanged from Dr. Estanislado SpireBrabham's consult.  Patient's renal function has deteriorated so nephrology recommends: tunneled dialysis catheter placement.  I discussed with the patient the nature of angiographic procedures, especially the limited patencies of any endovascular intervention.  The patient is aware of that the risks of an angiographic procedure include but are not limited to: bleeding, infection, access site complications, renal failure, embolization, rupture of vessel, dissection, possible need for emergent surgical intervention, possible need for surgical procedures to treat the patient's pathology, anaphylactic reaction to contrast, and stroke and death.  The patient is aware of the risks and agrees to proceed.   Leonides SakeBrian Cavion Faiola, MD [facility] [place] Office: [phone] Pager: [phone]  01/05/2015, 9:37 AM

## 2015-01-06 ENCOUNTER — Encounter (HOSPITAL_COMMUNITY): Payer: Self-pay | Admitting: Vascular Surgery

## 2015-01-06 DIAGNOSIS — N189 Chronic kidney disease, unspecified: Secondary | ICD-10-CM

## 2015-01-06 DIAGNOSIS — E039 Hypothyroidism, unspecified: Secondary | ICD-10-CM

## 2015-01-06 DIAGNOSIS — N179 Acute kidney failure, unspecified: Principal | ICD-10-CM

## 2015-01-06 DIAGNOSIS — I160 Hypertensive urgency: Secondary | ICD-10-CM

## 2015-01-06 DIAGNOSIS — E1165 Type 2 diabetes mellitus with hyperglycemia: Secondary | ICD-10-CM

## 2015-01-06 DIAGNOSIS — N184 Chronic kidney disease, stage 4 (severe): Secondary | ICD-10-CM

## 2015-01-06 DIAGNOSIS — E0841 Diabetes mellitus due to underlying condition with diabetic mononeuropathy: Secondary | ICD-10-CM

## 2015-01-06 DIAGNOSIS — E1151 Type 2 diabetes mellitus with diabetic peripheral angiopathy without gangrene: Secondary | ICD-10-CM

## 2015-01-06 LAB — RENAL FUNCTION PANEL
ALBUMIN: 2.3 g/dL — AB (ref 3.5–5.0)
ANION GAP: 12 (ref 5–15)
BUN: 82 mg/dL — ABNORMAL HIGH (ref 6–20)
CALCIUM: 8.3 mg/dL — AB (ref 8.9–10.3)
CO2: 23 mmol/L (ref 22–32)
CREATININE: 5.68 mg/dL — AB (ref 0.61–1.24)
Chloride: 102 mmol/L (ref 101–111)
GFR, EST AFRICAN AMERICAN: 13 mL/min — AB (ref >60)
GFR, EST NON AFRICAN AMERICAN: 11 mL/min — AB (ref >60)
Glucose, Bld: 348 mg/dL — ABNORMAL HIGH (ref 65–99)
PHOSPHORUS: 7.4 mg/dL — AB (ref 2.5–4.6)
Potassium: 4 mmol/L (ref 3.5–5.1)
SODIUM: 137 mmol/L (ref 135–145)

## 2015-01-06 LAB — HEPATITIS B CORE ANTIBODY, TOTAL: Hep B Core Total Ab: NEGATIVE

## 2015-01-06 LAB — HEPATITIS B SURFACE ANTIGEN: Hepatitis B Surface Ag: NEGATIVE

## 2015-01-06 LAB — GLUCOSE, CAPILLARY
GLUCOSE-CAPILLARY: 198 mg/dL — AB (ref 65–99)
GLUCOSE-CAPILLARY: 241 mg/dL — AB (ref 65–99)
GLUCOSE-CAPILLARY: 255 mg/dL — AB (ref 65–99)

## 2015-01-06 LAB — HEPATITIS B SURFACE ANTIBODY,QUALITATIVE: HEP B S AB: REACTIVE

## 2015-01-06 MED ORDER — DARBEPOETIN ALFA 100 MCG/0.5ML IJ SOSY
100.0000 ug | PREFILLED_SYRINGE | INTRAMUSCULAR | Status: DC
  Administered 2015-01-06: 100 ug via INTRAVENOUS

## 2015-01-06 MED ORDER — DARBEPOETIN ALFA 100 MCG/0.5ML IJ SOSY
PREFILLED_SYRINGE | INTRAMUSCULAR | Status: AC
  Filled 2015-01-06: qty 0.5

## 2015-01-06 NOTE — Procedures (Signed)
I have personally attended this patient's dialysis session.  HD #2 for new ESRD Tolerating UF well. TDC 300 Tight hep   Russell [name] Kalum Minner, MD [facility] [phone] Pager 01/06/2015, 9:45 AM

## 2015-01-06 NOTE — Progress Notes (Signed)
[facility] Rounding Note  Subjective:  1st HD yesterday 3 liters off 2nd HD today 3 liter goal Tolerating well so far   [employer] PowerFiled Weights   01/05/15 1710 01/05/15 2010 01/06/15 0700  Weight: 125.3 kg (276 lb 3.8 oz) 125.2 kg (276 lb 0.3 oz) 125.4 kg (276 lb 7.3 oz)    Physical Exam:  BP 124/82 mmHg  Pulse 79  Temp(Src) 97.6 F (36.4 C) (Oral)  Resp 10  Ht 6\' 2"  (1.88 m)  Wt 125.4 kg (276 lb 7.3 oz)  BMI 35.48 kg/m2  SpO2 97% NAD Puffy pale WM Generealized anasarca Ant clear but exp wheezing. + sacral, post thigh and LE pitting edema L RC AVF +B/T; incision CDI TDC R IJ (1/3)   Scheduled Meds: . arformoterol  15 mcg Nebulization BID  . budesonide (PULMICORT) nebulizer solution  0.5 mg Nebulization BID  . calcium acetate  1,334 mg Oral TID WC  . citalopram  40 mg Oral Daily  . cloNIDine  0.1 mg Oral BID  . cyclobenzaprine  10 mg Oral QHS  . doxycycline  100 mg Oral Q12H  . gabapentin  400 mg Oral QHS  . heparin  5,000 Units Subcutaneous 3 times per day  . insulin aspart  0-20 Units Subcutaneous TID WC  . insulin aspart  10 Units Subcutaneous TID WC  . insulin glargine  40 Units Subcutaneous Daily  . levothyroxine  50 mcg Oral QAC breakfast  . metoprolol succinate  100 mg Oral Daily  . pantoprazole  40 mg Oral Daily  . predniSONE  20 mg Oral Q breakfast  . sodium chloride  3 mL Intravenous Q12H   Continuous Infusions:  PRN Meds:.acetaminophen, albuterol, clonazePAM, hydrALAZINE, HYDROmorphone, oxyCODONE, promethazine    Recent Labs Lab 01/04/15 0454 01/04/15 1951 01/05/15 0533 01/06/15 0705  NA 136 131* 133* 137  K 5.2* 4.9 5.0 4.0  CL 103 101 101 102  CO2 21* 18* 21* 23  GLUCOSE 301* 570* 327* 348*  BUN 93* 105* 112* 82*  CREATININE 6.70* 7.57* 7.88* 5.68*  CALCIUM 8.4* 8.4* 8.4* 8.3*  PHOS 7.4*  --  7.9* 7.4*    Recent Labs Lab 12/30/14 1548 01/01/15 1120 01/05/15 0533  WBC 7.5 5.7 11.2*  NEUTROABS 5.4  --   --   HGB 10.9* 11.0*  9.5*  HCT 32.5* 32.3* 28.8*  MCV 84.0 85.2 85.5  PLT 243 243 281   26M with Nephrotic Syndrome and CKD4 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 AoCKD4, progressive with nephrotic syndrome presumed DN -  NEW ESRD.  HD #2 today, #3 tomorrow. CLIP process started. 2. Anasarca / Hypervolemia - down 3 liters w/1st HD. Goal 3 L today. 3. Anemia, mild, normocytic. Hb <10. Start Aranesp. TSat normal at 30 on 12/30. 4. Hyperphosphatemia - Changed to ca acetate. Phos slow improvement 5. HTN now just clonidine, metoprolol.  stable currently 6. DM2 w/triopathy. with recent A1c 8.5% - per primary team 7. Hx/o superficial venous thrombosis. Hypercoaguable panel neg. Anticoagulation stopped  8. S/p L RC AVF 01/01/2015 9. Mixed restrictive/obstructive lung disease. COPD exacerbation. On pred taper and doxy 10. Hypothyroid - on replacement 11. NSVT with history of prolonged QT  Russell [name] Casara Perrier, MD [facility] [phone] Pager 01/06/2015, 9:32 AM

## 2015-01-06 NOTE — Progress Notes (Signed)
Patient refusing heparin injections. Educated pt on heparin. Verbalizes understanding. Continues to refuse.

## 2015-01-06 NOTE — Progress Notes (Signed)
[facility] Progress Note  Patient: Russell [name] ZOX:[id number]   PCP: [name], MD DOB: 02-19-1970   DOA: 12/30/2014   DOS: 01/06/2015   Date of Service: the patient was seen and examined on 01/06/2015  Subjective: Patient denies any acute complaint no shortness of breath and nausea and vomiting no diarrhea. Tolerating hemodialysis. Nutrition: Tolerating oral diet Activity: Ambulating in the room Last BM: 01/04/2015  Assessment and Plan: 1. 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 Not getting better after he diuretics. nephrology consulted and started on hemodialysis. Improving at present. Will need outpatient set up for hemodialysis.   2. hypertensive urgency. Blood pressure at present stable will continue current medication.  3 acute COPD exacerbation. Steroid-induced hyperglycemia. Continue home inhalers, on prednisone and doxycycline.  4. Uncontrolled diabetes mellitus. Neuropathy. Adenopathy and nephropathy. Continue current insulin Lantus regimen with short-acting insulin. We'll reevaluate insulin requirement in the morning.  5.  Diabetic neuropathy. Continuing gabapentin.  6. Hypothyroidism. Continuing Synthroid.  DVT Prophylaxis: subcutaneous Heparin. Nutrition: renal diet Advance goals of care discussion: full code  Brief Summary of Hospitalization:  HPI: As per the H and P dictated on admission, "The patient is a 45 y.o. 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 He has had progressive kidney disease with 4 hospitalizations in the last 6 months for uncontrolled diabetes, nausea, vomiting, hypertensive urgency, and AKI.   In April 2016, his creatinine was 0.98 but rapidly increased and was as high as 6.41 in September 2016. The patient states that a year ago he weighted around 200-lbs and that he has gained about 70-lbs which he attributes to water weight, however, most of his weights from the last 2 years have been 240-250-lbs at the lowest. He had an attempt at biopsy over the summer at [facility], however, it was nondiagnostic and a difficult procedure. They suspected that he had diabetic nephropathy. He has been managed by Dr. [name] as an outpatient and she has seen him frequently. He has continued to gain weight despite escalating his lasix from 80mg  po BID to 160mg  po BID and adding metolazone. He has developed wheezing and DOE with full body edema, orthopnea, and fatigue. In clinic, his SBP was 180, other vital signs wnl and he was directly admitted for diuresis in the setting of AKI. Labs were not sent from linic, but last creatinine from 11/19 was 3.19 and stat labs from arrival are pending." Daily update, Procedures: 01/01/2015, hemodialysis catheter placement. Consultants:  Vascular surgery and nephrology Antibiotics: Anti-infectives    Start     Dose/Rate Route Frequency Ordered Stop   01/05/15 0935  ceFAZolin (ANCEF) 2-3 GM-% IVPB SOLR    Comments:  Jones, Tomika   : cabinet override      01/05/15 0935 01/05/15 0952   01/03/15 2200  doxycycline (VIBRA-TABS) tablet 100 mg     100 mg Oral Every 12 hours 01/03/15 1634 01/06/15 0959   01/01/15 0600  cefUROXime (ZINACEF) 1.5 g in dextrose 5 % 50 mL IVPB     1.5 g 100 mL/hr over 30 Minutes Intravenous On call  to O.R. 12/31/14 1627 01/01/15 0745       Family Communication: family was present at bedside, at  the time of interview.  Opportunity was given to ask question and all questions were answered satisfactorily.   Disposition:  Expected discharge date: 01/08/2015 Barriers to safe discharge:  Arrangement for hemodialysis as an outpatient   Intake/Output Summary (Last 24 hours) at 01/06/15 1922 Last data filed at 01/06/15 1850  Gross per 24 hour  Intake    800 ml  Output   3021 ml  Net  -2221 ml   Filed Weights   01/05/15 2010 01/06/15 0700 01/06/15 1040  Weight: 125.2 kg (276 lb 0.3 oz) 125.4 kg (276 lb 7.3 oz) 122 kg (268 lb 15.4 oz)    Objective: Physical Exam: Filed Vitals:   01/06/15 1030 01/06/15 1040 01/06/15 1212 01/06/15 1849  BP: 148/93 137/89 141/90 132/77  Pulse: 78 78 88 85  Temp:  97.7 F (36.5 C) 98.3 F (36.8 C) 98.9 F (37.2 C)  TempSrc:  Oral  Oral  Resp: 10 14 15 16   Height:      Weight:  122 kg (268 lb 15.4 oz)    SpO2:    98%     General: Appear in mild distress, no Rash; Oral Mucosa moist. Cardiovascular: S1 and S2 Present, no Murmur, no JVD Respiratory: Bilateral Air entry present and Clear to Auscultation, no Crackles, no wheezes Abdomen: Bowel Sound present, Soft and no tenderness Extremities: no Pedal edema, no calf tenderness Neurology: Grossly no focal neuro deficit.  Data Reviewed: CBC:  Recent Labs Lab 01/01/15 1120 01/05/15 0533  WBC 5.7 11.2*  HGB 11.0* 9.5*  HCT 32.3* 28.8*  MCV 85.2 85.5  PLT 243 281   Basic Metabolic Panel:  Recent Labs Lab 01/02/15 0550 01/03/15 0319 01/04/15 0454 01/04/15 1951 01/05/15 0533 01/06/15 0705  NA 138 137 136 131* 133* 137  K 4.0 4.5 5.2* 4.9 5.0 4.0  CL 106 103 103 101 101 102  CO2 25 22 21* 18* 21* 23  GLUCOSE 200* 217* 301* 570* 327* 348*  BUN 62* 73* 93* 105* 112* 82*  CREATININE 5.28* 5.88* 6.70* 7.57* 7.88* 5.68*  CALCIUM 8.1* 8.0* 8.4* 8.4* 8.4* 8.3*  PHOS 8.1* 9.1* 7.4*  --  7.9* 7.4*   Liver Function Tests:  Recent Labs Lab 01/02/15 0550 01/03/15 0319  01/04/15 0454 01/05/15 0533 01/06/15 0705  ALBUMIN 1.8* 1.8* 2.0* 2.4* 2.3*   No results for input(s): LIPASE, AMYLASE in the last 168 hours. No results for input(s): AMMONIA in the last 168 hours.  Cardiac Enzymes: No results for input(s): CKTOTAL, CKMB, CKMBINDEX, TROPONINI in the last 168 hours.  BNP (last 3 results) No results for input(s): BNP in the last 8760 hours.  CBG:  Recent Labs Lab 01/05/15 1034 01/05/15 1200 01/05/15 1809 01/06/15 1136 01/06/15 1701  GLUCAP 283* 251* 293* 198* 241*    No results found for this or any previous visit (from the past 240 hour(s)).   Studies: No results found.   Scheduled Meds: . arformoterol  15 mcg Nebulization BID  . budesonide (PULMICORT) nebulizer solution  0.5 mg Nebulization BID  . calcium acetate  1,334 mg Oral TID WC  . citalopram  40 mg Oral Daily  . cloNIDine  0.1 mg Oral BID  . cyclobenzaprine  10 mg Oral QHS  . Darbepoetin Alfa      . darbepoetin (ARANESP) injection - DIALYSIS  100 mcg Intravenous Q Wed-HD  . gabapentin  400 mg Oral QHS  . heparin  5,000 Units Subcutaneous 3 times per day  . insulin aspart  0-20 Units Subcutaneous TID WC  . insulin aspart  10 Units Subcutaneous TID WC  . insulin glargine  40 Units Subcutaneous Daily  . levothyroxine  50 mcg Oral QAC breakfast  . metoprolol succinate  100 mg Oral Daily  . pantoprazole  40 mg Oral Daily  . predniSONE  20 mg Oral Q breakfast  . sodium chloride  3 mL Intravenous Q12H   Continuous Infusions:  PRN Meds: acetaminophen, albuterol, clonazePAM, hydrALAZINE, HYDROmorphone, oxyCODONE, promethazine  Time spent: 30 minutes  Author: Lynden OxfordPranav Brycin Kille, MD [facility] Pager: [phone] 01/06/2015 7:22 PM  If 7PM-7AM, please contact night-coverage at [email or web address], password [email or web address]

## 2015-01-07 DIAGNOSIS — Z8719 Personal history of other diseases of the digestive system: Secondary | ICD-10-CM

## 2015-01-07 DIAGNOSIS — N183 Chronic kidney disease, stage 3 unspecified: Secondary | ICD-10-CM

## 2015-01-07 DIAGNOSIS — E1141 Type 2 diabetes mellitus with diabetic mononeuropathy: Secondary | ICD-10-CM

## 2015-01-07 LAB — RENAL FUNCTION PANEL
ALBUMIN: 2 g/dL — AB (ref 3.5–5.0)
Anion gap: 10 (ref 5–15)
BUN: 59 mg/dL — AB (ref 6–20)
CO2: 25 mmol/L (ref 22–32)
Calcium: 7.6 mg/dL — ABNORMAL LOW (ref 8.9–10.3)
Chloride: 106 mmol/L (ref 101–111)
Creatinine, Ser: 4.2 mg/dL — ABNORMAL HIGH (ref 0.61–1.24)
GFR calc Af Amer: 18 mL/min — ABNORMAL LOW (ref >60)
GFR calc non Af Amer: 16 mL/min — ABNORMAL LOW (ref >60)
GLUCOSE: 191 mg/dL — AB (ref 65–99)
PHOSPHORUS: 5.7 mg/dL — AB (ref 2.5–4.6)
POTASSIUM: 3.6 mmol/L (ref 3.5–5.1)
SODIUM: 141 mmol/L (ref 135–145)

## 2015-01-07 LAB — CBC
HEMATOCRIT: 30 % — AB (ref 39.0–52.0)
Hemoglobin: 10.1 g/dL — ABNORMAL LOW (ref 13.0–17.0)
MCH: 29.1 pg (ref 26.0–34.0)
MCHC: 33.7 g/dL (ref 30.0–36.0)
MCV: 86.5 fL (ref 78.0–100.0)
Platelets: 258 10*3/uL (ref 150–400)
RBC: 3.47 MIL/uL — ABNORMAL LOW (ref 4.22–5.81)
RDW: 13.9 % (ref 11.5–15.5)
WBC: 10.6 10*3/uL — ABNORMAL HIGH (ref 4.0–10.5)

## 2015-01-07 LAB — GLUCOSE, CAPILLARY
GLUCOSE-CAPILLARY: 258 mg/dL — AB (ref 65–99)
GLUCOSE-CAPILLARY: 321 mg/dL — AB (ref 65–99)
Glucose-Capillary: 118 mg/dL — ABNORMAL HIGH (ref 65–99)

## 2015-01-07 NOTE — Progress Notes (Signed)
Patient refused TED hose.  Patient stated that they are expensive and he already has a pair at home.  Patient stated that he would allow us to give[place] him heparin SQ.  Peri MarisAndrew Euleta Belson, MBA, BS, RN

## 2015-01-07 NOTE — Progress Notes (Signed)
[facility] Progress Note  Patient: Russell [name]:[id number]   PCP: [name], MD DOB: [birth year]   DOA: 12/30/2014   DOS: 01/07/2015   Date of Service: the patient was seen and examined on 01/07/2015  Subjective: Does not have any acute complaint. No shortness of breath and nausea no vomiting no diarrhea. Ambulating in the hallway. Tolerating oral diet. Nutrition: Tolerating oral diet Activity: Ambulating in the room Last BM: 01/023/2017  Assessment and Plan: 1. 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 Not getting better after diuretics. nephrology consulted and started on hemodialysis. Improving at present. Will need outpatient set up for hemodialysis.   2. hypertensive urgency. Blood pressure at present stable will continue current medication.  3 acute COPD exacerbation. Resolved  Steroid-induced hyperglycemia. Continue home inhalers, on prednisone day 2 of 20 mg and completed doxycycline.  4. Uncontrolled diabetes mellitus. Neuropathy and nephropathy. Hemoglobin A1c 8.5 on admission  Continue current insulin Lantus regimen with short-acting insulin. Insulin requirement has reduced after tapering the dose of prednisone we will continue to monitor.  5.  Diabetic neuropathy. Continuing gabapentin.  6. Hypothyroidism. Continuing Synthroid.  DVT Prophylaxis: subcutaneous Heparin. Nutrition: renal diet Advance goals of care discussion: full code  Brief Summary of Hospitalization:  HPI: As per the H and P dictated on admission, "The patient is a 45 y.o. 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 He has had progressive kidney disease with 4 hospitalizations in the last 6 months for uncontrolled diabetes, nausea, vomiting, hypertensive urgency, and AKI.   In April 2016, his creatinine was 0.98 but rapidly increased and was as high as 6.41 in September 2016. The patient states that a year ago he weighted around 200-lbs and that he has gained about 70-lbs which he attributes to water weight, however, most of his weights from the last 2 years have been 240-250-lbs at the lowest. He had an attempt at biopsy over the summer at [facility], however, it was nondiagnostic and a difficult procedure. They suspected that he had diabetic nephropathy. He has been managed by Dr. [name] as an outpatient and she has seen him frequently. He has continued to gain weight despite escalating his lasix from 80mg  po BID to 160mg  po BID and adding metolazone. He has developed wheezing and DOE with full body edema, orthopnea, and fatigue. In clinic, his SBP was 180, other vital signs wnl and he was directly admitted for diuresis in the setting of AKI. Labs were not sent from linic, but last creatinine from 11/19 was 3.19 and stat labs from arrival are pending." Daily update, Procedures: 01/01/2015, hemodialysis catheter placement. Consultants:  Vascular surgery and nephrology Antibiotics: Anti-infectives    Start     Dose/Rate Route Frequency Ordered Stop   01/05/15 0935  ceFAZolin (ANCEF) 2-3 GM-% IVPB SOLR    Comments:  Jones, Tomika   : cabinet override      01/05/15 0935 01/05/15 0952   01/03/15 2200  doxycycline (VIBRA-TABS) tablet 100 mg     100 mg Oral Every 12 hours 01/03/15 1634 01/06/15 0959   01/01/15 0600  cefUROXime (ZINACEF) 1.5 g in dextrose 5 % 50 mL IVPB     1.5 g 100 mL/hr over 30  Minutes Intravenous On call to O.R. 12/31/14 1627 01/01/15 0745       Family Communication: family was present at bedside, at the time of interview.  Opportunity was given to ask question and all questions were answered satisfactorily.   Disposition:  Expected discharge date: 01/09/2015 Barriers to safe discharge:  Arrangement for hemodialysis as an outpatient   Intake/Output Summary (Last 24 hours) at 01/07/15 1734 Last data filed at 01/07/15 1434  Gross per 24 hour  Intake    920 ml  Output   3000 ml  Net  -2080 ml   Filed Weights   01/06/15 2056 01/07/15 0640 01/07/15 1051  Weight: 122.061 kg (269 lb 1.5 oz) 124.5 kg (274 lb 7.6 oz) 121 kg (266 lb 12.1 oz)    Objective: Physical Exam: Filed Vitals:   01/07/15 1005 01/07/15 1051 01/07/15 1159 01/07/15 1641  BP: 118/74 127/84 159/78 130/73  Pulse: 78 80 80 84  Temp:  97 F (36.1 C) 98.2 F (36.8 C) 98.3 F (36.8 C)  TempSrc:  Oral Oral Oral  Resp:  18 17 18   Height:      Weight:  121 kg (266 lb 12.1 oz)    SpO2:   98% 97%    General: Appear in mild distress, no Rash; Oral Mucosa moist. Cardiovascular: S1 and S2 Present, no Murmur, no JVD Respiratory: Bilateral Air entry present and Clear to Auscultation, no Crackles, no wheezes Abdomen: Bowel Sound present, Soft and no tenderness Extremities: trace Pedal edema, no calf tenderness  Data Reviewed: CBC:  Recent Labs Lab 01/01/15 1120 01/05/15 0533 01/07/15 0653  WBC 5.7 11.2* 10.6*  HGB 11.0* 9.5* 10.1*  HCT 32.3* 28.8* 30.0*  MCV 85.2 85.5 86.5  PLT 243 281 258   Basic Metabolic Panel:  Recent Labs Lab 01/03/15 0319 01/04/15 0454 01/04/15 1951 01/05/15 0533 01/06/15 0705 01/07/15 0653  NA 137 136 131* 133* 137 141  K 4.5 5.2* 4.9 5.0 4.0 3.6  CL 103 103 101 101 102 106  CO2 22 21* 18* 21* 23 25  GLUCOSE 217* 301* 570* 327* 348* 191*  BUN 73* 93* 105* 112* 82* 59*  CREATININE 5.88* 6.70* 7.57* 7.88* 5.68* 4.20*  CALCIUM 8.0* 8.4* 8.4* 8.4*  8.3* 7.6*  PHOS 9.1* 7.4*  --  7.9* 7.4* 5.7*   Liver Function Tests:  Recent Labs Lab 01/03/15 0319 01/04/15 0454 01/05/15 0533 01/06/15 0705 01/07/15 0653  ALBUMIN 1.8* 2.0* 2.4* 2.3* 2.0*   No results for input(s): LIPASE, AMYLASE in the last 168 hours. No results for input(s): AMMONIA in the last 168 hours.  Cardiac Enzymes: No results for input(s): CKTOTAL, CKMB, CKMBINDEX, TROPONINI in the last 168 hours.  BNP (last 3 results) No results for input(s): BNP in the last 8760 hours.  CBG:  Recent Labs Lab 01/05/15 1809 01/06/15 1136 01/06/15 1701 01/06/15 2204 01/07/15 1157  GLUCAP 293* 198* 241* 255* 118*    No results found for this or any previous visit (from the past 240 hour(s)).   Studies: No results found.   Scheduled Meds: . arformoterol  15 mcg Nebulization BID  . budesonide (PULMICORT) nebulizer solution  0.5 mg Nebulization BID  . calcium acetate  1,334 mg Oral TID WC  . citalopram  40 mg Oral Daily  . cloNIDine  0.1 mg Oral BID  . cyclobenzaprine  10 mg Oral QHS  .  darbepoetin (ARANESP) injection - DIALYSIS  100 mcg Intravenous Q Wed-HD  . gabapentin  400 mg Oral QHS  . heparin  5,000 Units Subcutaneous 3 times per day  . insulin aspart  0-20 Units Subcutaneous TID WC  . insulin aspart  10 Units Subcutaneous TID WC  . insulin glargine  40 Units Subcutaneous Daily  . levothyroxine  50 mcg Oral QAC breakfast  . metoprolol succinate  100 mg Oral Daily  . pantoprazole  40 mg Oral Daily  . sodium chloride  3 mL Intravenous Q12H   Continuous Infusions:  PRN Meds: acetaminophen, albuterol, clonazePAM, hydrALAZINE, HYDROmorphone, oxyCODONE, promethazine  Time spent: 30 minutes  Author: Lynden Oxford, MD [facility] Pager: [phone] 01/07/2015 5:34 PM  If 7PM-7AM, please contact night-coverage at [email or web address], password [facility]

## 2015-01-08 DIAGNOSIS — E8779 Other fluid overload: Secondary | ICD-10-CM

## 2015-01-08 DIAGNOSIS — Z992 Dependence on renal dialysis: Secondary | ICD-10-CM

## 2015-01-08 DIAGNOSIS — N186 End stage renal disease: Secondary | ICD-10-CM

## 2015-01-08 LAB — RENAL FUNCTION PANEL
Albumin: 2 g/dL — ABNORMAL LOW (ref 3.5–5.0)
Anion gap: 7 (ref 5–15)
BUN: 43 mg/dL — AB (ref 6–20)
CHLORIDE: 104 mmol/L (ref 101–111)
CO2: 28 mmol/L (ref 22–32)
CREATININE: 3.51 mg/dL — AB (ref 0.61–1.24)
Calcium: 8.1 mg/dL — ABNORMAL LOW (ref 8.9–10.3)
GFR calc Af Amer: 23 mL/min — ABNORMAL LOW (ref >60)
GFR, EST NON AFRICAN AMERICAN: 20 mL/min — AB (ref >60)
GLUCOSE: 288 mg/dL — AB (ref 65–99)
POTASSIUM: 4 mmol/L (ref 3.5–5.1)
Phosphorus: 4.2 mg/dL (ref 2.5–4.6)
Sodium: 139 mmol/L (ref 135–145)

## 2015-01-08 LAB — GLUCOSE, CAPILLARY
GLUCOSE-CAPILLARY: 218 mg/dL — AB (ref 65–99)
Glucose-Capillary: 132 mg/dL — ABNORMAL HIGH (ref 65–99)

## 2015-01-08 MED ORDER — LANTUS SOLOSTAR 100 UNIT/ML ~~LOC~~ SOPN: 40.0000 [IU] | PEN_INJECTOR | Freq: Every day | SUBCUTANEOUS | Status: DC

## 2015-01-08 MED ORDER — CLONIDINE HCL 0.1 MG PO TABS: 0.1000 mg | ORAL_TABLET | Freq: Two times a day (BID) | ORAL | Status: DC

## 2015-01-08 MED ORDER — CALCIUM ACETATE (PHOS BINDER) 667 MG PO CAPS: 1334.0000 mg | ORAL_CAPSULE | Freq: Three times a day (TID) | ORAL | Status: DC

## 2015-01-08 MED ORDER — ACETAMINOPHEN 325 MG PO TABS
ORAL_TABLET | ORAL | Status: AC
  Administered 2015-01-08: 650 mg via ORAL
  Filled 2015-01-08: qty 2

## 2015-01-08 NOTE — Progress Notes (Signed)
[facility] Rounding Note  Subjective:  3 HD treatments this week  Weight down a total of 7 kg Feels so much better Wonders about HD today so can go home before the snowstorm  Filed Weights   01/06/15 2056 01/07/15 0640 01/07/15 1051  Weight: 122.061 kg (269 lb 1.5 oz) 124.5 kg (274 lb 7.6 oz) 121 kg (266 lb 12.1 oz)    Physical Exam:  BP 162/90 mmHg  Pulse 81  Temp(Src) 97.4 F (36.3 C) (Oral)  Resp 17  Ht 6\' 2"  (1.88 m)  Wt 121 kg (266 lb 12.1 oz)  BMI 34.23 kg/m2  SpO2 100% NAD Very nice WM eating lunch Generealized anasarca Ant clear Pitting edema MUCH reduced L RC AVF +B/T; incision CDI TDC R IJ (1/3)   Scheduled Meds: . arformoterol  15 mcg Nebulization BID  . budesonide (PULMICORT) nebulizer solution  0.5 mg Nebulization BID  . calcium acetate  1,334 mg Oral TID WC  . citalopram  40 mg Oral Daily  . cloNIDine  0.1 mg Oral BID  . cyclobenzaprine  10 mg Oral QHS  . darbepoetin (ARANESP) injection - DIALYSIS  100 mcg Intravenous Q Wed-HD  . gabapentin  400 mg Oral QHS  . heparin  5,000 Units Subcutaneous 3 times per day  . insulin aspart  0-20 Units Subcutaneous TID WC  . insulin aspart  10 Units Subcutaneous TID WC  . insulin glargine  40 Units Subcutaneous Daily  . levothyroxine  50 mcg Oral QAC breakfast  . metoprolol succinate  100 mg Oral Daily  . pantoprazole  40 mg Oral Daily  . 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 AoCKD4, progressive with nephrotic syndrome presumed DN -  NEW ESRD.  Has had HD X3 with 7 kg weight reduction. CLIPPED to [facility] T[facility] 2nd shift. He would like to do HD today and go home to avoid his family having to go out in the upcoming snowstorm.  I think this would be fine. HD today. Weight post as EDW to start from.  2. Anemia, mild, normocytic. Hb <10. Started Aranesp 100. Dosed on 1/4. TSat normal at 30 on 12/30. Will need to change to Mircera at outpt unit. 3. Hyperphosphatemia - Changed to ca acetate. Phos slow improvement. Discharge on ca acetate 4. HTN now just clonidine, metoprolol.  stable currently 5. DM2 w/triopathy. with recent A1c 8.5% - per primary team 6. Hx/o superficial venous thrombosis. Hypercoaguable panel neg. Anticoagulation stopped  7. S/p L RC AVF 01/01/2015 8. Mixed restrictive/obstructive lung disease. COPD exacerbation. On pred taper and doxy 9. Hypothyroid - on replacement 10. NSVT with history of prolonged QT   OK with me for HD today and then home. Should be fine to wait until Tuesday for first  outpt HD. Post weight = EDW to start with at outpt unit.   Camille [name] Diara Chaudhari, MD [facility] [phone] Pager 01/08/2015, 12:46 PM

## 2015-01-08 NOTE — Progress Notes (Signed)
01/08/2015  6:30 PM  Patient just returned from hemo and requested immediately discharge given the weather.  Discharge instructions were reviewed with the patient, including follow up appointments, medications, diet, activity, etc.  Pt asked questions appropriately and verbalized understanding upon completion of discharge education.  PIV removed.  Pt wife and family waiting to take patient home pending receipt of prescription for clonidine from MD.  Dr. Allena KatzPatel paged to address this.   Russell [name], Eliyah Mcshea Brooke

## 2015-01-08 NOTE — Progress Notes (Signed)
01/08/2015 12:36 PM  Confirmed with Velna HatchetSheila in HD that CLIP has been done for patient.  [facility].  Patient may start Tuesday, 01/12/2015.  Theadora RamaKIRKMAN, Teyanna Thielman Brooke

## 2015-01-08 NOTE — Procedures (Signed)
I have personally attended this patient's dialysis session.   HD #4 for new ESRD Tolerating well TDC  Russell [name] [name] Selmer, MD [facility] [phone] Pager 01/08/2015, 3:07 PM

## 2015-01-08 NOTE — Progress Notes (Signed)
Russell [name] to be D/C'd Home per MD order.  Discussed prescriptions and follow up appointments with the patient. Prescriptions given to patient, medication list explained in detail. Pt verbalized understanding.    Medication List    STOP taking these medications        furosemide 80 MG tablet  Commonly known as:  LASIX     hydrALAZINE 10 MG tablet  Commonly known as:  APRESOLINE     metolazone 5 MG tablet  Commonly known as:  ZAROXOLYN      TAKE these medications        acetaminophen 500 MG tablet  Commonly known as:  TYLENOL  Take 1 tablet (500 mg total) by mouth every 6 (six) hours as needed for moderate pain.     albuterol 108 (90 Base) MCG/ACT inhaler  Commonly known as:  PROVENTIL HFA;VENTOLIN HFA  Inhale 1 puff into the lungs every 6 (six) hours as needed for wheezing or shortness of breath.     albuterol (2.5 MG/3ML) 0.083% nebulizer solution  Commonly known as:  PROVENTIL  Take 3 mLs (2.5 mg total) by nebulization every 6 (six) hours as needed for wheezing or shortness of breath.     albuterol-ipratropium 18-103 MCG/ACT inhaler  Commonly known as:  COMBIVENT  Inhale 2 puffs into the lungs every 4 (four) hours.     ALPRAZolam 0.5 MG tablet  Commonly known as:  XANAX  TAKE 1 TABLET TWICE DAILY AS NEEDED FOR SLEEP     amLODipine 10 MG tablet  Commonly known as:  NORVASC  Take 1 tablet (10 mg total) by mouth daily.     calcium acetate 667 MG capsule  Commonly known as:  PHOSLO  Take 2 capsules (1,334 mg total) by mouth 3 (three) times daily with meals.     citalopram 40 MG tablet  Commonly known as:  CELEXA  Take 40 mg by mouth at bedtime.     clonazePAM 1 MG tablet  Commonly known as:  KLONOPIN  Take 1 mg by mouth at bedtime as needed (sleep).     cloNIDine 0.1 MG tablet  Commonly known as:  CATAPRES  Take 0.1 mg by mouth 3 (three) times daily.     cyclobenzaprine 10 MG tablet  Commonly known as:  FLEXERIL  Take 1 tablet (10 mg total) by mouth at  bedtime.     gabapentin 100 MG capsule  Commonly known as:  NEURONTIN  Take 1 capsule (100 mg total) by mouth at bedtime.     HUMALOG KWIKPEN 200 UNIT/ML Sopn  Generic drug:  Insulin Lispro  See admin instructions. 1 unit per 5 grams of carbohydrates with meals/snacks plus coverage scale. Total 50 units/day     HYDROmorphone 2 MG tablet  Commonly known as:  DILAUDID  Take 1 tablet (2 mg total) by mouth every 6 (six) hours as needed for severe pain.     LANTUS SOLOSTAR 100 UNIT/ML Solostar Pen  Generic drug:  Insulin Glargine  Inject 40 Units into the skin daily.     levothyroxine 50 MCG tablet  Commonly known as:  SYNTHROID, LEVOTHROID  Take 1 tablet (50 mcg total) by mouth daily.     metoCLOPramide 5 MG tablet  Commonly known as:  REGLAN  Take 1 tablet (5 mg total) by mouth 4 (four) times daily.     metoprolol succinate 100 MG 24 hr tablet  Commonly known as:  TOPROL XL  Take 1 tablet (100 mg total) by mouth  daily.     mycophenolate 500 MG tablet  Commonly known as:  CELLCEPT  Take 500 mg by mouth daily.     pantoprazole 40 MG tablet  Commonly known as:  PROTONIX  Take 40 mg by mouth daily.     promethazine 25 MG tablet  Commonly known as:  PHENERGAN  Take 1 tablet (25 mg total) by mouth every 6 (six) hours as needed for nausea.     sucralfate 1 GM/10ML suspension  Commonly known as:  CARAFATE  Take 1 g by mouth 4 (four) times daily -  with meals and at bedtime.     tobramycin 0.3 % ophthalmic solution  Commonly known as:  TOBREX  Place 1 drop into both eyes every 4 (four) hours.        Filed Vitals:   01/08/15 1730 01/08/15 1800  BP: 139/90 156/104  Pulse: 84 84  Temp:  98.2 F (36.8 C)  Resp:  18    Skin clean, dry and intact without evidence of skin break down, no evidence of skin tears noted. IV catheter discontinued intact. Site without signs and symptoms of complications. Dressing and pressure applied. Pt denies pain at this time. No complaints  noted.  An After Visit Summary was printed and given to the patient. Patient escorted via WC, and D/C home via private auto.  Jonell CluckKadeesha Marialuiza Car RN [facility] Phone [phone]

## 2015-01-11 NOTE — Discharge Summary (Signed)
[facility] Discharge Summary   Patient: Russell BirkenheadSteven B [name] ZOX:[id number]   PCP: Elvina SidleLAUENSTEIN,KURT, MD DOB: 05/01/1970   Date of admission: 12/30/2014   Date of discharge: 01/08/2015    Discharge Diagnoses:  Principal Problem:   Acute renal failure superimposed on stage 4 chronic kidney disease (HCC) Active Problems:   Hypothyroidism (acquired)   Diabetic neuropathy (HCC)   Uncontrolled diabetes mellitus type 2 with peripheral artery disease (HCC)   Acute on chronic renal failure (HCC)   Hx of gastroesophageal reflux (GERD)   Prolonged Q-T interval on ECG   AKI (acute kidney injury) (HCC)   Hypertensive urgency  Recommendations for Outpatient Follow-up:  1. Follow-up with PCP as well as vascular surgery as scheduled.   Follow-up Information    Follow up with Russell [name], [name], MD In 6 weeks.   Specialties:  Vascular Surgery, Interventional Cardiology, Cardiology   Why:  Office will call you to arrange your appt (sent)   Contact information:   [address] [place] [address] [phone]       Follow up with Elvina SidleLAUENSTEIN,KURT, MD. Schedule an appointment as soon as possible for a visit in 1 week.   Specialty:  Family Medicine   Why:  APPOINTMENT: Dr. does not make appointments; He will be available in the walkin clinic Tues., 01-12-15 @ 8am; Wed., 01-13-15 @ 3pm; Thurs. 01-14-15, Sat., 01-16-15, & Sun., 01-17-15 @ 8 am   Contact information:   [address] [place] [address] [phone]       Diet recommendation: Renal diet  Activity: The patient is advised to gradually reintroduce usual activities.  Discharge Condition: good  History of present illness: As per the H and P dictated on admission, "The patient is a 45 y.o. 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 He has had progressive kidney disease with 4 hospitalizations in the last 6 months for uncontrolled diabetes, nausea, vomiting, hypertensive urgency, and AKI.   In April 2016, his creatinine was 0.98 but rapidly increased and was as high as 6.41 in September 2016. The patient states that a year ago he weighted around 200-lbs and that he has gained about 70-lbs which he attributes to water weight, however, most of his weights from the last 2 years have been 240-250-lbs at the lowest. [facility], however, it was nondiagnostic and a difficult procedure. They suspected that he had diabetic nephropathy. He has been managed by Dr. Kathrene BongoGoldsborough as an outpatient and she has seen him frequently. He has continued to gain weight despite escalating his lasix from 80mg  po BID to 160mg  po BID and adding metolazone. He has developed wheezing and DOE with full body edema, orthopnea, and fatigue. In clinic, his SBP was 180, other vital signs wnl and he was directly admitted for diuresis in the setting of AKI. Labs were not sent from linic, but last creatinine from 11/19 was 3.19 and stat labs from arrival are pending"  Hospital Course:  Summary of his active problems in the hospital is as following. 1. Acute renal failure superimposed on stage 4 chronic  kidney disease (HCC) ESRD. Patient initially presented with weight gain as well as shortness of breath with evidence of volume overload, he was Not getting better after diuretics. He was direct admit from nephrology clinic and due to progressively worsening renal function and no response to  diuresis patient was declared end-stage renal disease and was started on hemodialysis. There was rapid improvement in his volume status after initiation of the dialysis and the patient tolerated it very well. Patient was clipped for regular hemodialysis as an outpatient by nephrology. Appreciate input from nephrology.  2. hypertensive urgency. Presented with significantly elevated blood pressure was felt secondary to volume overload, Blood pressure at present stable will continue current medication.  3 acute COPD exacerbation. Resolved  completed doxycycline as well as prednisone  4. Uncontrolled diabetes mellitus. Steroid-induced hyperglycemia. Neuropathy and nephropathy. Hemoglobin A1c 8.5 on admission  Continue current insulin Lantus regimen with short-acting insulin. Patient's home insulin regimen will be increased on discharge continue sliding scale  5. Diabetic neuropathy. Continuing gabapentin.  6. Hypothyroidism. Continuing Synthroid.  All other chronic medical condition were stable during the hospitalization.  Patient was ambulatory without any assistance. On the day of the discharge the patient's vitals were stable, and no other acute medical condition were reported by patient. the patient was felt safe to be discharge at home with family support.  Procedures and Results:  Hemodialysis   Right IJ TdC placement  Creation of left radial AV fistula  Consultations:  Vascular Surgery, nephrology  DISCHARGE MEDICATION: Discharge Medication List as of 01/08/2015  6:11 PM    START taking these medications   Details  calcium acetate (PHOSLO) 667 MG capsule Take 2 capsules (1,334 mg total) by mouth 3 (three) times daily with meals., Starting 01/08/2015, Until Discontinued, Normal      CONTINUE these medications which have CHANGED   Details  LANTUS SOLOSTAR 100 UNIT/ML Solostar Pen Inject 40 Units into the skin daily., Starting 01/08/2015, Until Discontinued, Normal        CONTINUE these medications which have NOT CHANGED   Details  acetaminophen (TYLENOL) 500 MG tablet Take 1 tablet (500 mg total) by mouth every 6 (six) hours as needed for moderate pain., Starting 09/29/2014, Until Discontinued, Normal    albuterol (PROVENTIL HFA;VENTOLIN HFA) 108 (90 BASE) MCG/ACT inhaler Inhale 1 puff into the lungs every 6 (six) hours as needed for wheezing or shortness of breath., Until Discontinued, Historical Med    albuterol (PROVENTIL) (2.5 MG/3ML) 0.083% nebulizer solution Take 3 mLs (2.5 mg total) by nebulization every 6 (six) hours as needed for wheezing or shortness of breath., Starting 09/03/2014, Until Discontinued, Normal    albuterol-ipratropium (COMBIVENT) 18-103 MCG/ACT inhaler Inhale 2 puffs into the lungs every 4 (four) hours., Starting 09/03/2014, Until Discontinued, Normal    ALPRAZolam (XANAX) 0.5 MG tablet TAKE 1 TABLET TWICE DAILY AS NEEDED FOR SLEEP, Print    amLODipine (NORVASC) 10 MG tablet Take 1 tablet (10 mg total) by mouth daily., Starting 11/21/2014, Until Discontinued, Normal    citalopram (CELEXA) 40 MG tablet Take 40 mg by mouth at bedtime., Starting 12/21/2014, Until Discontinued, Historical Med    clonazePAM (KLONOPIN) 1 MG tablet Take 1 mg by mouth at bedtime as needed (sleep). , Until Discontinued, Historical Med    cyclobenzaprine (FLEXERIL) 10 MG tablet Take 1 tablet (10 mg total) by mouth at bedtime., Starting 10/12/2014, Until Discontinued, Normal    gabapentin (NEURONTIN) 100 MG capsule Take 1 capsule (100 mg total) by mouth at bedtime., Starting  09/29/2014, Until Discontinued, Normal    HYDROmorphone (DILAUDID) 2 MG tablet Take 1 tablet (2 mg total) by mouth every 6 (six) hours as needed for severe pain., Starting 11/24/2014, Until Discontinued, Print    Insulin Lispro (HUMALOG KWIKPEN) 200 UNIT/ML SOPN See admin instructions. 1 unit per 5 grams of carbohydrates with meals/snacks plus coverage scale. Total 50 units/day, Starting  12/16/2013, Until Discontinued, Historical Med    levothyroxine (SYNTHROID, LEVOTHROID) 50 MCG tablet Take 1 tablet (50 mcg total) by mouth daily., Starting 05/02/2013, Until Discontinued, Normal    metoprolol succinate (TOPROL-XL) 100 MG 24 hr tablet Take 1 tablet (100 mg total) by mouth daily., Starting 05/20/2014, Until Discontinued, Normal    mycophenolate (CELLCEPT) 500 MG tablet Take 500 mg by mouth daily., Starting 12/28/2014, Until Discontinued, Historical Med    pantoprazole (PROTONIX) 40 MG tablet Take 40 mg by mouth daily., Until Discontinued, Historical Med    promethazine (PHENERGAN) 25 MG tablet Take 1 tablet (25 mg total) by mouth every 6 (six) hours as needed for nausea., Starting 11/21/2014, Until Discontinued, Normal    sucralfate (CARAFATE) 1 GM/10ML suspension Take 1 g by mouth 4 (four) times daily -  with meals and at bedtime., Until Discontinued, Historical Med    tobramycin (TOBREX) 0.3 % ophthalmic solution Place 1 drop into both eyes every 4 (four) hours., Starting 11/24/2014, Until Discontinued, Normal    cloNIDine (CATAPRES) 0.1 MG tablet Take 0.1 mg by mouth 3 (three) times daily., Until Discontinued, Historical Med    metoCLOPramide (REGLAN) 5 MG tablet Take 1 tablet (5 mg total) by mouth 4 (four) times daily., Starting 10/12/2014, Until Discontinued, No Print      STOP taking these medications     furosemide (LASIX) 80 MG tablet      hydrALAZINE (APRESOLINE) 10 MG tablet      metolazone (ZAROXOLYN) 5 MG tablet        Allergies  Allergen Reactions  . Ibuprofen Other (See Comments)    MD told him not to take due to kidney disease.   Discharge Instructions    Diet Carb Modified    Complete by:  As directed      Diet renal 60/70-02-03-1198    Complete by:  As directed      Increase activity slowly    Complete by:  As directed           Discharge Exam: Filed Weights   01/07/15 1051 01/08/15 1339 01/08/15 1800  Weight: 121 kg (266 lb 12.1 oz) 123.3  kg (271 lb 13.2 oz) 119.6 kg (263 lb 10.7 oz)   Filed Vitals:   01/08/15 1730 01/08/15 1800  BP: 139/90 156/104  Pulse: 84 84  Temp:  98.2 F (36.8 C)  Resp:  18   General: Appear in no distress, no Rash; Oral Mucosa moist. Cardiovascular: S1 and S2 Present, no Murmur, no JVD Respiratory: Bilateral Air entry present and Clear to Auscultation, no Crackles, no wheezes Abdomen: Bowel Sound present, Soft and no tenderness Extremities: no Pedal edema, no calf tenderness Neurology: Grossly no focal neuro deficit.  The results of significant diagnostics from this hospitalization (including imaging, microbiology, ancillary and laboratory) are listed below for reference.    Significant Diagnostic Studies: Dg Chest Port 1 View  01/05/2015  CLINICAL DATA:  Dialysis catheter insertion EXAM: PORTABLE CHEST 1 VIEW COMPARISON:  12/30/2014 FINDINGS: Slight elevation of the right hemidiaphragm. Low lung volumes. Right dialysis catheter tips are in the right atrium. No pneumothorax. Mild vascular congestion  and interstitial prominence which could be mild interstitial edema. Heart is borderline in size. IMPRESSION: Low lung volumes. Interstitial prominence could reflect interstitial edema. Borderline heart size. Electronically Signed   By: Charlett Nose M.D.   On: 01/05/2015 11:15   Portable Chest 1 View  12/30/2014  CLINICAL DATA:  Wheezing for 3 days. EXAM: PORTABLE CHEST 1 VIEW COMPARISON:  09/24/2014 FINDINGS: Heart is borderline in size. Lungs are clear. No effusions or edema. No acute bony abnormality. IMPRESSION: No active disease. Electronically Signed   By: Charlett Nose M.D.   On: 12/30/2014 16:09    Microbiology: No results found for this or any previous visit (from the past 240 hour(s)).   Labs: CBC:  Recent Labs Lab 01/05/15 0533 01/07/15 0653  WBC 11.2* 10.6*  HGB 9.5* 10.1*  HCT 28.8* 30.0*  MCV 85.5 86.5  PLT 281 258   Basic Metabolic Panel:  Recent Labs Lab 01/04/15 1951  01/05/15 0533 01/06/15 0705 01/07/15 0653 01/08/15 0418  NA 131* 133* 137 141 139  K 4.9 5.0 4.0 3.6 4.0  CL 101 101 102 106 104  CO2 18* 21* 23 25 28   GLUCOSE 570* 327* 348* 191* 288*  BUN 105* 112* 82* 59* 43*  CREATININE 7.57* 7.88* 5.68* 4.20* 3.51*  CALCIUM 8.4* 8.4* 8.3* 7.6* 8.1*  PHOS  --  7.9* 7.4* 5.7* 4.2   Liver Function Tests:  Recent Labs Lab 01/05/15 0533 01/06/15 0705 01/07/15 0653 01/08/15 0418  ALBUMIN 2.4* 2.3* 2.0* 2.0*   CBG:  Recent Labs Lab 01/07/15 1157 01/07/15 1639 01/07/15 2107 01/08/15 0741 01/08/15 1205  GLUCAP 118* 258* 321* 218* 132*   Time spent: 30 minutes  Signed:  Aasiyah Auerbach  [facility] 01/08/2015, 6:06 PM

## 2015-01-21 ENCOUNTER — Ambulatory Visit (INDEPENDENT_AMBULATORY_CARE_PROVIDER_SITE_OTHER): Payer: Managed Care, Other (non HMO) | Admitting: Family Medicine

## 2015-01-21 VITALS — BP 128/84 | HR 78 | Temp 98.0°F | Resp 18 | Wt 255.4 lb

## 2015-01-21 DIAGNOSIS — E1129 Type 2 diabetes mellitus with other diabetic kidney complication: Secondary | ICD-10-CM

## 2015-01-21 DIAGNOSIS — N049 Nephrotic syndrome with unspecified morphologic changes: Secondary | ICD-10-CM | POA: Diagnosis not present

## 2015-01-21 DIAGNOSIS — I10 Essential (primary) hypertension: Secondary | ICD-10-CM

## 2015-01-21 DIAGNOSIS — R5381 Other malaise: Secondary | ICD-10-CM | POA: Diagnosis not present

## 2015-01-21 DIAGNOSIS — M79604 Pain in right leg: Secondary | ICD-10-CM

## 2015-01-21 DIAGNOSIS — J4530 Mild persistent asthma, uncomplicated: Secondary | ICD-10-CM | POA: Diagnosis not present

## 2015-01-21 DIAGNOSIS — N19 Unspecified kidney failure: Secondary | ICD-10-CM

## 2015-01-21 DIAGNOSIS — M79605 Pain in left leg: Secondary | ICD-10-CM | POA: Diagnosis not present

## 2015-01-21 NOTE — Progress Notes (Addendum)
By signing my name below, I, [name], attest that this documentation has been prepared under the direction and in the presence of [name], MD. Electronically Signed: [name], Scribe. 01/21/2015 , 11:51 AM .  Patient was seen in room 9 .   Patient ID: Russell [name] MRN: [id number], DOB: 11-29-1970, 45 y.o. Date of Encounter: 01/21/2015  Primary Physician: [name], MD  Chief Complaint:  Chief Complaint  Patient presents with  . Follow-up    hospital follow up and needs 2 handicap placards    HPI:  Russell [name] is a 45 y.o. male who presents to [facility] for hospital follow up. He requests having 2 handicap placards.   He has a heart catheter placed. He feels very drained and tired after dialysis. He has asthma and has shortness of breath. He has yet to receive his nebulizer. He's running low on dilaudid because the tylenol doesn't relieve. He also notes walking would cause him to be dizzy and also caused falls recently. But he denies dizziness when sitting or driving.   He takes 30 units of insulin. He has very mild swelling in his lower extremities. His a1c was done at 7.1 or 7.2. Labs are being performed by the dialysis center.  He states that he's leaking out too much albumin in his urine. His nutritionist wants to increase his meat intake but his diets collide between his kidney and diabetes. He is seeing a nutritionist to try to better balance his diabetic and renal failure needs.  He's concerned about his partner being bedridden due to illness.   Past Medical History  Diagnosis Date  . Hypertension   . DVT (deep venous thrombosis), H/o 01/2014-on Xarelto 03/24/2014    LLE  . Secondary DM with DKA-AG=16, BIcarb Nl 03/24/2014  . Age-related macular degeneration, wet, both eyes (HCC)     "I'm getting Alia treatments" (12/30/2014)  . Hypothyroidism   . Asthma   . Pneumonia 11/2013  . Daily headache   . Arthritis    "hands" (12/30/2014)  . Anxiety   . Depression   . Type 2 diabetes mellitus with diabetic nephropathy (HCC)   . Nephrotic syndrome 05/18/2014  . CKD stage 3 due to type 2 diabetes mellitus (HCC)      Home Meds: Prior to Admission medications   Medication Sig Start Date End Date Taking? Authorizing Provider  acetaminophen (TYLENOL) 500 MG tablet Take 1 tablet (500 mg total) by mouth every 6 (six) hours as needed for moderate pain. 09/29/14   Albertine Grates, MD  albuterol (PROVENTIL HFA;VENTOLIN HFA) 108 (90 BASE) MCG/ACT inhaler Inhale 1 puff into the lungs every 6 (six) hours as needed for wheezing or shortness of breath.    Historical Provider, MD  albuterol (PROVENTIL) (2.5 MG/3ML) 0.083% nebulizer solution Take 3 mLs (2.5 mg total) by nebulization every 6 (six) hours as needed for wheezing or shortness of breath. 09/03/14   [name], MD  albuterol-ipratropium (COMBIVENT) 18-103 MCG/ACT inhaler Inhale 2 puffs into the lungs every 4 (four) hours. Patient taking differently: Inhale 2 puffs into the lungs every 4 (four) hours. Take as needed 09/03/14   [name], MD  ALPRAZolam Prudy Feeler) 0.5 MG tablet TAKE 1 TABLET TWICE DAILY AS NEEDED FOR SLEEP Patient taking differently: Take 0.5 mg by mouth at bedtime as needed for sleep.  10/12/14   [name], MD  amLODipine (NORVASC) 10 MG tablet Take 1 tablet (10 mg total) by mouth daily. 11/21/14  Campbell Stall, MD  calcium acetate (PHOSLO) 667 MG capsule Take 2 capsules (1,334 mg total) by mouth 3 (three) times daily with meals. 01/08/15   Rolly Salter, MD  citalopram (CELEXA) 40 MG tablet Take 40 mg by mouth at bedtime. 12/21/14   Historical Provider, MD  clonazePAM (KLONOPIN) 1 MG tablet Take 1 mg by mouth at bedtime as needed (sleep).     Historical Provider, MD  cloNIDine (CATAPRES) 0.1 MG tablet Take 1 tablet (0.1 mg total) by mouth 2 (two) times daily. 01/08/15   Rolly Salter, MD  cyclobenzaprine (FLEXERIL) 10 MG tablet Take 1 tablet (10 mg  total) by mouth at bedtime. 10/12/14   [name], MD  gabapentin (NEURONTIN) 100 MG capsule Take 1 capsule (100 mg total) by mouth at bedtime. Patient taking differently: Take 400 mg by mouth at bedtime.  09/29/14   Albertine Grates, MD  HYDROmorphone (DILAUDID) 2 MG tablet Take 1 tablet (2 mg total) by mouth every 6 (six) hours as needed for severe pain. 11/24/14   [name], MD  Insulin Lispro (HUMALOG KWIKPEN) 200 UNIT/ML SOPN See admin instructions. 1 unit per 5 grams of carbohydrates with meals/snacks plus coverage scale. Total 50 units/day 12/16/13   Historical Provider, MD  LANTUS SOLOSTAR 100 UNIT/ML Solostar Pen Inject 40 Units into the skin daily. 01/08/15   Rolly Salter, MD  levothyroxine (SYNTHROID, LEVOTHROID) 50 MCG tablet Take 1 tablet (50 mcg total) by mouth daily. 05/02/13   [name], MD  metoCLOPramide (REGLAN) 5 MG tablet Take 1 tablet (5 mg total) by mouth 4 (four) times daily. Patient not taking: Reported on 12/30/2014 10/12/14   [name], MD  metoprolol succinate (TOPROL-XL) 100 MG 24 hr tablet Take 1 tablet (100 mg total) by mouth daily. 05/20/14   Lewayne Bunting, MD  mycophenolate (CELLCEPT) 500 MG tablet Take 500 mg by mouth daily. 12/28/14   Historical Provider, MD  pantoprazole (PROTONIX) 40 MG tablet Take 40 mg by mouth daily.    Historical Provider, MD  promethazine (PHENERGAN) 25 MG tablet Take 1 tablet (25 mg total) by mouth every 6 (six) hours as needed for nausea. 11/21/14   Campbell Stall, MD  sucralfate (CARAFATE) 1 GM/10ML suspension Take 1 g by mouth 4 (four) times daily -  with meals and at bedtime.    Historical Provider, MD  tobramycin (TOBREX) 0.3 % ophthalmic solution Place 1 drop into both eyes every 4 (four) hours. 11/24/14   [name], MD    Allergies:  Allergies  Allergen Reactions  . Ibuprofen Other (See Comments)    MD told him not to take due to kidney disease.    Social History   Social History  . Marital Status:  Significant Other    Spouse Name: N/A  . Number of Children: N/A  . Years of Education: N/A   Occupational History  .      Maintenance Curator   Social History Main Topics  . Smoking status: Never Smoker   . Smokeless tobacco: Never Used  . Alcohol Use: No  . Drug Use: No  . Sexual Activity: Not Currently   Other Topics Concern  . Not on file   Social History Narrative   Patient currently lives with his fiance   Works with facilities   Nonsmoker, nondrinker        Review of Systems: Constitutional: negative for fever, chills, night sweats, weight changes, or fatigue  HEENT: negative for vision changes, hearing  loss, congestion, rhinorrhea, ST, epistaxis, or sinus pressure Cardiovascular: negative for chest pain or palpitations Respiratory: negative for hemoptysis, wheezing, or cough; positive for shortness of breath Abdominal: negative for abdominal pain, nausea, vomiting, diarrhea, or constipation Dermatological: negative for rash Neurologic: negative for headache, or syncope; positive for dizziness All other systems reviewed and are otherwise negative with the exception to those above and in the HPI.  Physical Exam: Blood pressure 128/84, pulse 78, temperature 98 F (36.7 C), temperature source Oral, resp. rate 18, weight 255 lb 6.4 oz (115.849 kg), SpO2 98 %., Body mass index is 32.78 kg/(m^2). General: Well developed, well nourished, in no acute distress. Head: Normocephalic, atraumatic, eyes without discharge, sclera non-icteric, nares are without discharge. Bilateral auditory canals clear, TM's are without perforation, pearly grey and translucent with reflective cone of light bilaterally. Oral cavity moist, posterior pharynx without exudate, erythema, peritonsillar abscess, or post nasal drip.  Neck: Supple. No thyromegaly. Full ROM. No lymphadenopathy. Lungs: Clear bilaterally to auscultation without wheezes, rales, or rhonchi. Breathing is unlabored. Heart: RRR  with S1 S2. No murmurs, rubs, or gallops appreciated. Abdomen: Soft, non-tender, non-distended with normoactive bowel sounds. No hepatomegaly. No rebound/guarding. No obvious abdominal masses. Msk:  Strength and tone normal for age. Extremities/Skin: Warm and dry. No clubbing or cyanosis. 1+ edema. No rashes or suspicious lesions.  Thrill left forearm with well-healed shunt site Neuro: Alert and oriented X 3. Moves all extremities spontaneously. Gait is normal. CNII-XII grossly in tact.  Slurred speech Psych:  Responds to questions appropriately with a normal affect.     ASSESSMENT AND PLAN:  45 y.o. year old male with multiple problems Renal failure  Asthma, mild persistent, uncomplicated - Plan: DME Nebulizer machine  Leg pain, bilateral  Type 2 diabetes mellitus with other diabetic kidney complication (HCC)  Malaise  Essential hypertension  Nephrotic syndrome  I discussed patient's situation within the patient at length. I think this is the best he's looked a long time.  Signed, [name], MD 01/21/2015 11:51 AM

## 2015-01-29 ENCOUNTER — Encounter: Payer: Self-pay | Admitting: Emergency Medicine

## 2015-01-29 ENCOUNTER — Emergency Department
Admission: EM | Admit: 2015-01-29 | Discharge: 2015-01-29 | Disposition: A | Discharge status: Home / Self Care | Payer: Managed Care, Other (non HMO) | Attending: Emergency Medicine | Admitting: Emergency Medicine

## 2015-01-29 DIAGNOSIS — N186 End stage renal disease: Secondary | ICD-10-CM | POA: Diagnosis not present

## 2015-01-29 DIAGNOSIS — E1151 Type 2 diabetes mellitus with diabetic peripheral angiopathy without gangrene: Secondary | ICD-10-CM | POA: Insufficient documentation

## 2015-01-29 DIAGNOSIS — Z792 Long term (current) use of antibiotics: Secondary | ICD-10-CM | POA: Insufficient documentation

## 2015-01-29 DIAGNOSIS — E1140 Type 2 diabetes mellitus with diabetic neuropathy, unspecified: Secondary | ICD-10-CM | POA: Diagnosis not present

## 2015-01-29 DIAGNOSIS — I120 Hypertensive chronic kidney disease with stage 5 chronic kidney disease or end stage renal disease: Secondary | ICD-10-CM | POA: Diagnosis not present

## 2015-01-29 DIAGNOSIS — Z992 Dependence on renal dialysis: Secondary | ICD-10-CM | POA: Diagnosis not present

## 2015-01-29 DIAGNOSIS — Z794 Long term (current) use of insulin: Secondary | ICD-10-CM | POA: Insufficient documentation

## 2015-01-29 DIAGNOSIS — R710 Precipitous drop in hematocrit: Secondary | ICD-10-CM | POA: Diagnosis present

## 2015-01-29 DIAGNOSIS — R7989 Other specified abnormal findings of blood chemistry: Secondary | ICD-10-CM | POA: Diagnosis not present

## 2015-01-29 DIAGNOSIS — Z79899 Other long term (current) drug therapy: Secondary | ICD-10-CM | POA: Insufficient documentation

## 2015-01-29 DIAGNOSIS — R899 Unspecified abnormal finding in specimens from other organs, systems and tissues: Secondary | ICD-10-CM

## 2015-01-29 DIAGNOSIS — E1122 Type 2 diabetes mellitus with diabetic chronic kidney disease: Secondary | ICD-10-CM | POA: Insufficient documentation

## 2015-01-29 LAB — CBC WITH DIFFERENTIAL/PLATELET
BASOS ABS: 0.1 10*3/uL (ref 0–0.1)
BASOS PCT: 2 %
Eosinophils Absolute: 0.1 10*3/uL (ref 0–0.7)
Eosinophils Relative: 1 %
HEMATOCRIT: 32.4 % — AB (ref 40.0–52.0)
Hemoglobin: 11 g/dL — ABNORMAL LOW (ref 13.0–18.0)
Lymphocytes Relative: 13 %
Lymphs Abs: 1.2 10*3/uL (ref 1.0–3.6)
MCH: 28.7 pg (ref 26.0–34.0)
MCHC: 34.1 g/dL (ref 32.0–36.0)
MCV: 84.1 fL (ref 80.0–100.0)
MONO ABS: 0.7 10*3/uL (ref 0.2–1.0)
MONOS PCT: 8 %
Neutro Abs: 7.1 10*3/uL — ABNORMAL HIGH (ref 1.4–6.5)
Neutrophils Relative %: 76 %
Platelets: 343 10*3/uL (ref 150–440)
RBC: 3.85 MIL/uL — ABNORMAL LOW (ref 4.40–5.90)
RDW: 14.2 % (ref 11.5–14.5)
WBC: 9.3 10*3/uL (ref 3.8–10.6)

## 2015-01-29 LAB — COMPREHENSIVE METABOLIC PANEL
ALT: 14 U/L — AB (ref 17–63)
AST: 16 U/L (ref 15–41)
Albumin: 3.4 g/dL — ABNORMAL LOW (ref 3.5–5.0)
Alkaline Phosphatase: 95 U/L (ref 38–126)
Anion gap: 9 (ref 5–15)
BILIRUBIN TOTAL: 0.6 mg/dL (ref 0.3–1.2)
BUN: 19 mg/dL (ref 6–20)
CALCIUM: 8.8 mg/dL — AB (ref 8.9–10.3)
CHLORIDE: 94 mmol/L — AB (ref 101–111)
CO2: 33 mmol/L — ABNORMAL HIGH (ref 22–32)
CREATININE: 3.48 mg/dL — AB (ref 0.61–1.24)
GFR, EST AFRICAN AMERICAN: 23 mL/min — AB (ref >60)
GFR, EST NON AFRICAN AMERICAN: 20 mL/min — AB (ref >60)
Glucose, Bld: 246 mg/dL — ABNORMAL HIGH (ref 65–99)
Potassium: 3.3 mmol/L — ABNORMAL LOW (ref 3.5–5.1)
Sodium: 136 mmol/L (ref 135–145)
TOTAL PROTEIN: 7.5 g/dL (ref 6.5–8.1)

## 2015-01-29 LAB — CBC
HCT: 35 % — ABNORMAL LOW (ref 40.0–52.0)
Hemoglobin: 11.8 g/dL — ABNORMAL LOW (ref 13.0–18.0)
MCH: 28.3 pg (ref 26.0–34.0)
MCHC: 33.8 g/dL (ref 32.0–36.0)
MCV: 83.7 fL (ref 80.0–100.0)
PLATELETS: 413 10*3/uL (ref 150–440)
RBC: 4.18 MIL/uL — AB (ref 4.40–5.90)
RDW: 14.1 % (ref 11.5–14.5)
WBC: 9.9 10*3/uL (ref 3.8–10.6)

## 2015-01-29 LAB — ABO/RH: ABO/RH(D): B NEG

## 2015-01-29 LAB — TYPE AND SCREEN
ABO/RH(D): B NEG
ANTIBODY SCREEN: NEGATIVE

## 2015-01-29 NOTE — Discharge Instructions (Signed)
Return to the emergency room for any new or worrisome symptoms. Your hemoglobin here is over 11 which is normal for you. If you have any bleeding, numbness, weakness, or you feels significant worse in any way including fever or other complaints please return to the emergency room. Follow closely with your primary care doctor.

## 2015-01-29 NOTE — ED Notes (Signed)
Pt updated on treatment plan. Pt verbalizes understanding.  

## 2015-01-29 NOTE — ED Notes (Signed)
Patient presents to the ED via private vehicle from dialysis.  Patient was told at dialysis that his hemoglobin was 6.3 and staff at the dialysis clinic instructed patient to come to the ED for a blood transfusion.  Patient had dialysis today.  Patient has only been on dialysis for 1 month.  Patient reports that the last few times he has been at dialysis he was told his hemoglobin was low.  Patient appears pale.  Patient reports feeling tired.

## 2015-01-29 NOTE — ED Notes (Signed)
Warm blankets provided. Dr.mcshane in to assess pt.

## 2015-01-29 NOTE — ED Notes (Signed)
Patient relayed he had blood drawn earlier at dialysis. After dialysis he was told he needed to come to ER for blood because his HGB was 6.3. Patient HGB is now showing 11.3

## 2015-01-29 NOTE — ED Provider Notes (Addendum)
[facility] Emergency Department Provider Note  ____________________________________________   I have reviewed the triage vital signs and the nursing notes.   HISTORY  Chief Complaint Anemia    HPI Russell [name] is a 45 y.o. male who is easily been placed on dialysis. He feels generally weak for the last several months as a result of the dialysis per however is not feeling particular symptoms today. He went to the dialysis site today and they told him his hemoglobin was 6.5 and he needed to be admitted for transfusion. Patient has had no melena no bright red blood per rectum, and his hemoglobin and here is 11.5. He denies any particular new symptoms and would like to go home.  Past Medical History  Diagnosis Date  . Hypertension   . DVT (deep venous thrombosis), H/o 01/2014-on Xarelto 03/24/2014    LLE  . Secondary DM with DKA-AG=16, BIcarb Nl 03/24/2014  . Age-related macular degeneration, wet, both eyes (HCC)     "I'm getting Alia treatments" (12/30/2014)  . Hypothyroidism   . Asthma   . Pneumonia 11/2013  . Daily headache   . Arthritis     "hands" (12/30/2014)  . Anxiety   . Depression   . Type 2 diabetes mellitus with diabetic nephropathy (HCC)   . Nephrotic syndrome 05/18/2014  . CKD stage 3 due to type 2 diabetes mellitus [facility])     Patient Active Problem List   Diagnosis Date Noted  . Nephrotic syndrome 01/03/2015  . Acute renal failure superimposed on stage 4 chronic kidney disease (HCC) 12/30/2014  . AKI (acute kidney injury) (HCC) 12/30/2014  . Hypertensive urgency 12/30/2014  . Essential hypertension, benign   . Thyroid activity decreased   . Hx of gastroesophageal reflux (GERD) 09/30/2014  . Prolonged Q-T interval on ECG 09/30/2014  . CKD (chronic kidney disease), stage III 09/30/2014  . Acute on chronic renal failure (HCC) 09/24/2014  . Diabetic neuropathy (HCC) 09/21/2014  . Uncontrolled diabetes mellitus type 2 with peripheral  artery disease (HCC) 09/21/2014  . Morbid obesity (HCC) 09/21/2014  . Superficial thrombophlebitis 03/24/2014  . Hypothyroidism (acquired) 03/24/2014    Past Surgical History  Procedure Laterality Date  . Ankle fracture surgery Right 1988  . Eye surgery    . Esophagogastroduodenoscopy N/A 03/29/2014    Procedure: ESOPHAGOGASTRODUODENOSCOPY (EGD);  Surgeon: Dorena Cookey, MD;  Location: [facility];  Service: Endoscopy;  Laterality: N/A;  . Tonsillectomy and adenoidectomy  1970s  . Fracture surgery    . Av fistula placement Left 01/01/2015    Procedure: CREATION OF LEFT RADIAL CEPHALIC ARTERIOVENOUS (AV) FISTULA ;  Surgeon: Pryor Ochoa, MD;  Location: [facility] OR;  Service: Vascular;  Laterality: Left;  . Insertion of dialysis catheter Right 01/05/2015    Procedure: INSERTION OF RIGHT INTERNAL JUGULAR DIALYSIS CATHETER;  Surgeon: Fransisco Hertz, MD;  Location: [facility] OR;  Service: Vascular;  Laterality: Right;    Current Outpatient Rx  Name  Route  Sig  Dispense  Refill  . acetaminophen (TYLENOL) 500 MG tablet   Oral   Take 1 tablet (500 mg total) by mouth every 6 (six) hours as needed for moderate pain.   30 tablet   0   . albuterol (PROVENTIL HFA;VENTOLIN HFA) 108 (90 BASE) MCG/ACT inhaler   Inhalation   Inhale 1 puff into the lungs every 6 (six) hours as needed for wheezing or shortness of breath.         Marland Kitchen albuterol (PROVENTIL) (2.5 MG/3ML) 0.083%  nebulizer solution   Nebulization   Take 3 mLs (2.5 mg total) by nebulization every 6 (six) hours as needed for wheezing or shortness of breath.   75 mL   12   . albuterol-ipratropium (COMBIVENT) 18-103 MCG/ACT inhaler   Inhalation   Inhale 2 puffs into the lungs every 4 (four) hours. Patient taking differently: Inhale 2 puffs into the lungs every 4 (four) hours. Take as needed   1 Inhaler   11   . ALPRAZolam (XANAX) 0.5 MG tablet      TAKE 1 TABLET TWICE DAILY AS NEEDED FOR SLEEP Patient taking differently: Take 0.5 mg by mouth at  bedtime as needed for sleep.    60 tablet   0     No refills available for controlled drug   . amLODipine (NORVASC) 10 MG tablet   Oral   Take 1 tablet (10 mg total) by mouth daily.   30 tablet   2   . calcium acetate (PHOSLO) 667 MG capsule   Oral   Take 2 capsules (1,334 mg total) by mouth 3 (three) times daily with meals.   180 capsule   0   . citalopram (CELEXA) 40 MG tablet   Oral   Take 40 mg by mouth at bedtime.      1   . clonazePAM (KLONOPIN) 1 MG tablet   Oral   Take 1 mg by mouth at bedtime as needed (sleep).          . cloNIDine (CATAPRES) 0.1 MG tablet   Oral   Take 1 tablet (0.1 mg total) by mouth 2 (two) times daily.   60 tablet   0   . cyclobenzaprine (FLEXERIL) 10 MG tablet   Oral   Take 1 tablet (10 mg total) by mouth at bedtime.   30 tablet   6   . gabapentin (NEURONTIN) 100 MG capsule   Oral   Take 1 capsule (100 mg total) by mouth at bedtime. Patient taking differently: Take 400 mg by mouth at bedtime.    30 capsule   0   . HYDROmorphone (DILAUDID) 2 MG tablet   Oral   Take 1 tablet (2 mg total) by mouth every 6 (six) hours as needed for severe pain.   30 tablet   0   . Insulin Lispro (HUMALOG KWIKPEN) 200 UNIT/ML SOPN      See admin instructions. 1 unit per 5 grams of carbohydrates with meals/snacks plus coverage scale. Total 50 units/day         . LANTUS SOLOSTAR 100 UNIT/ML Solostar Pen   Subcutaneous   Inject 40 Units into the skin daily.   15 mL   3     Dispense as written.   Marland Kitchen levothyroxine (SYNTHROID, LEVOTHROID) 50 MCG tablet   Oral   Take 1 tablet (50 mcg total) by mouth daily.   90 tablet   3   . metoCLOPramide (REGLAN) 5 MG tablet   Oral   Take 1 tablet (5 mg total) by mouth 4 (four) times daily. Patient not taking: Reported on 12/30/2014   30 tablet   0   . metoprolol succinate (TOPROL-XL) 100 MG 24 hr tablet   Oral   Take 1 tablet (100 mg total) by mouth daily.   90 tablet   3   . mycophenolate  (CELLCEPT) 500 MG tablet   Oral   Take 500 mg by mouth daily.         . pantoprazole (PROTONIX)  40 MG tablet   Oral   Take 40 mg by mouth daily. Reported on 01/21/2015         . promethazine (PHENERGAN) 25 MG tablet   Oral   Take 1 tablet (25 mg total) by mouth every 6 (six) hours as needed for nausea.   30 tablet   2   . sucralfate (CARAFATE) 1 GM/10ML suspension   Oral   Take 1 g by mouth 4 (four) times daily -  with meals and at bedtime.         Marland Kitchen tobramycin (TOBREX) 0.3 % ophthalmic solution   Both Eyes   Place 1 drop into both eyes every 4 (four) hours.   5 mL   0     Allergies Ibuprofen  Family History  Problem Relation Age of Onset  . Obesity Mother     Patient states that family members have no other medical illnesses other than what I have described  . Heart disease      No family history  . Cancer Father 50    AML  . Kidney cancer Maternal Grandmother     Social History Social History  Substance Use Topics  . Smoking status: Never Smoker   . Smokeless tobacco: Never Used  . Alcohol Use: No    Review of Systems Constitutional: No fever/chills Eyes: No visual changes. ENT: No sore throat. No stiff neck no neck pain Cardiovascular: Denies chest pain. Respiratory: Denies shortness of breath. Gastrointestinal:   no vomiting.  No diarrhea.  No constipation. Genitourinary: Negative for dysuria. Musculoskeletal: Negative lower extremity swelling Skin: Negative for rash. Neurological: Negative for headaches, focal weakness or numbness. 10-point ROS otherwise negative.  ____________________________________________   PHYSICAL EXAM:  VITAL SIGNS: ED Triage Vitals  Enc Vitals Group     BP 01/29/15 1610 109/64 mmHg     Pulse Rate 01/29/15 1610 90     Resp 01/29/15 1610 20     Temp 01/29/15 1610 99.5 F (37.5 C)     Temp Source 01/29/15 1610 Oral     SpO2 01/29/15 1610 97 %     Weight 01/29/15 1610 249 lb 1.9 oz (113 kg)     Height 01/29/15  1610  (1.88 m)     Head Cir --      Peak Flow --      Pain Score 01/29/15 1611 8     Pain Loc --      Pain Edu? --      Excl. in GC? --     Constitutional: Alert and oriented. Well appearing and in no acute distress. Eyes: Conjunctivae are normal. PERRL. EOMI. Head: Atraumatic. Nose: No congestion/rhinnorhea. Mouth/Throat: Mucous membranes are moist.  Oropharynx non-erythematous. Neck: No stridor.   Nontender with no meningismus Cardiovascular: Normal rate, regular rhythm. Grossly normal heart sounds.  Good peripheral circulation. Respiratory: Normal respiratory effort.  No retractions. Lungs CTAB. Abdominal: Soft and nontender. No distention. No guarding no rebound Back:  There is no focal tenderness or step off there is no midline tenderness there are no lesions noted. there is no CVA tenderness Musculoskeletal: No lower extremity tenderness. No joint effusions, no DVT signs strong distal pulses no edema Neurologic:  Normal speech and language. No gross focal neurologic deficits are appreciated.  Skin:  Skin is warm, dry and intact. No rash noted. Psychiatric: Mood and affect are normal. Speech and behavior are normal.  ____________________________________________   LABS (all labs ordered are listed, but only abnormal  results are displayed)  Labs Reviewed  COMPREHENSIVE METABOLIC PANEL - Abnormal; Notable for the following:    Potassium 3.3 (*)    Chloride 94 (*)    CO2 33 (*)    Glucose, Bld 246 (*)    Creatinine, Ser 3.48 (*)    Calcium 8.8 (*)    Albumin 3.4 (*)    ALT 14 (*)    GFR calc non Af Amer 20 (*)    GFR calc Af Amer 23 (*)    All other components within normal limits  CBC - Abnormal; Notable for the following:    RBC 4.18 (*)    Hemoglobin 11.8 (*)    HCT 35.0 (*)    All other components within normal limits  URINALYSIS COMPLETEWITH MICROSCOPIC ([facility] ONLY)  CBC WITH DIFFERENTIAL/PLATELET  TYPE AND SCREEN  ABO/RH    ____________________________________________  EKG  I personally interpreted any EKGs ordered by me or triage Normal sinus rhythm at 93 bpm, nonspecific ST changes. ST changes consistent with prior repolarization abnormality. ____________________________________________  RADIOLOGY  I reviewed any imaging ordered by me or triage that were performed during my shift ____________________________________________   PROCEDURES  Procedure(s) performed: None  Critical Care performed: None  ____________________________________________   INITIAL IMPRESSION / ASSESSMENT AND PLAN / ED COURSE  Pertinent labs & imaging results that were available during my care of the patient were reviewed by me and considered in my medical decision making (see chart for details).  Patient is here because he was told he was anemic but our blood work does not support this. We will recheck it to ensure that R is his right and there is is wrong. If that is correct we will discharge him home. Patient has no other complaints. ____________________________________________   FINAL CLINICAL IMPRESSION(S) / ED DIAGNOSES  Final diagnoses:  None     Jeanmarie Plant, MD 01/29/15 1952  Jeanmarie Plant, MD 01/29/15 2031  Jeanmarie Plant, MD 01/29/15 2033

## 2015-02-02 ENCOUNTER — Telehealth: Payer: Self-pay

## 2015-02-02 NOTE — Telephone Encounter (Signed)
PATIENT STATES ON JAN. 19, 2017 DR. LAUENSTEIN WROTE HIM A LETTER IN ORDER FOR HIM TO GET HIS NEBULIZER. [facility] HAS NOT GOTTEN IT YET. HE WOULD LIKE TO GET THIS PROCESSED AS SOON AS POSSIBLE. HE SAID IT NEEDS TO BE FAXED TO [phone]) [phone]. PLEASE CALL HIM WHEN IT HAS BEEN DONE. BEST PHONE [phone] (CELL)  MBC

## 2015-02-02 NOTE — Telephone Encounter (Signed)
Pt notified.  We will try [facility] since it is done through computer.  If this does not work we will then try [facility].

## 2015-02-09 ENCOUNTER — Encounter: Payer: Managed Care, Other (non HMO) | Admitting: Vascular Surgery

## 2015-02-09 ENCOUNTER — Encounter: Payer: Self-pay | Admitting: Vascular Surgery

## 2015-02-13 ENCOUNTER — Encounter (HOSPITAL_COMMUNITY): Payer: Self-pay | Admitting: Emergency Medicine

## 2015-02-13 ENCOUNTER — Inpatient Hospital Stay (HOSPITAL_COMMUNITY)
Admission: EM | Admit: 2015-02-13 | Discharge: 2015-02-16 | DRG: 637 | Disposition: A | Discharge status: Home / Self Care | Payer: Managed Care, Other (non HMO) | Attending: Family Medicine | Admitting: Family Medicine

## 2015-02-13 ENCOUNTER — Emergency Department (HOSPITAL_COMMUNITY): Payer: Managed Care, Other (non HMO)

## 2015-02-13 DIAGNOSIS — E1143 Type 2 diabetes mellitus with diabetic autonomic (poly)neuropathy: Secondary | ICD-10-CM | POA: Diagnosis present

## 2015-02-13 DIAGNOSIS — D649 Anemia, unspecified: Secondary | ICD-10-CM | POA: Diagnosis present

## 2015-02-13 DIAGNOSIS — Z794 Long term (current) use of insulin: Secondary | ICD-10-CM

## 2015-02-13 DIAGNOSIS — E1122 Type 2 diabetes mellitus with diabetic chronic kidney disease: Secondary | ICD-10-CM | POA: Diagnosis present

## 2015-02-13 DIAGNOSIS — Z992 Dependence on renal dialysis: Secondary | ICD-10-CM | POA: Diagnosis not present

## 2015-02-13 DIAGNOSIS — Z8051 Family history of malignant neoplasm of kidney: Secondary | ICD-10-CM | POA: Diagnosis not present

## 2015-02-13 DIAGNOSIS — N186 End stage renal disease: Secondary | ICD-10-CM | POA: Diagnosis present

## 2015-02-13 DIAGNOSIS — Z7951 Long term (current) use of inhaled steroids: Secondary | ICD-10-CM | POA: Diagnosis not present

## 2015-02-13 DIAGNOSIS — IMO0002 Reserved for concepts with insufficient information to code with codable children: Secondary | ICD-10-CM | POA: Diagnosis present

## 2015-02-13 DIAGNOSIS — E1110 Type 2 diabetes mellitus with ketoacidosis without coma: Secondary | ICD-10-CM | POA: Diagnosis present

## 2015-02-13 DIAGNOSIS — F329 Major depressive disorder, single episode, unspecified: Secondary | ICD-10-CM | POA: Diagnosis present

## 2015-02-13 DIAGNOSIS — G43A1 Cyclical vomiting, intractable: Secondary | ICD-10-CM | POA: Diagnosis not present

## 2015-02-13 DIAGNOSIS — E039 Hypothyroidism, unspecified: Secondary | ICD-10-CM | POA: Diagnosis present

## 2015-02-13 DIAGNOSIS — Z6832 Body mass index (BMI) 32.0-32.9, adult: Secondary | ICD-10-CM

## 2015-02-13 DIAGNOSIS — E669 Obesity, unspecified: Secondary | ICD-10-CM | POA: Diagnosis present

## 2015-02-13 DIAGNOSIS — H353290 Exudative age-related macular degeneration, unspecified eye, stage unspecified: Secondary | ICD-10-CM | POA: Diagnosis present

## 2015-02-13 DIAGNOSIS — Z806 Family history of leukemia: Secondary | ICD-10-CM | POA: Diagnosis not present

## 2015-02-13 DIAGNOSIS — Z886 Allergy status to analgesic agent status: Secondary | ICD-10-CM | POA: Diagnosis not present

## 2015-02-13 DIAGNOSIS — E1310 Other specified diabetes mellitus with ketoacidosis without coma: Principal | ICD-10-CM | POA: Diagnosis present

## 2015-02-13 DIAGNOSIS — I10 Essential (primary) hypertension: Secondary | ICD-10-CM | POA: Diagnosis not present

## 2015-02-13 DIAGNOSIS — E1010 Type 1 diabetes mellitus with ketoacidosis without coma: Secondary | ICD-10-CM | POA: Diagnosis not present

## 2015-02-13 DIAGNOSIS — R112 Nausea with vomiting, unspecified: Secondary | ICD-10-CM | POA: Diagnosis present

## 2015-02-13 DIAGNOSIS — I120 Hypertensive chronic kidney disease with stage 5 chronic kidney disease or end stage renal disease: Secondary | ICD-10-CM | POA: Diagnosis present

## 2015-02-13 DIAGNOSIS — R109 Unspecified abdominal pain: Secondary | ICD-10-CM

## 2015-02-13 DIAGNOSIS — E1151 Type 2 diabetes mellitus with diabetic peripheral angiopathy without gangrene: Secondary | ICD-10-CM | POA: Diagnosis present

## 2015-02-13 DIAGNOSIS — Z79899 Other long term (current) drug therapy: Secondary | ICD-10-CM | POA: Diagnosis not present

## 2015-02-13 DIAGNOSIS — Z86718 Personal history of other venous thrombosis and embolism: Secondary | ICD-10-CM

## 2015-02-13 DIAGNOSIS — F419 Anxiety disorder, unspecified: Secondary | ICD-10-CM | POA: Diagnosis present

## 2015-02-13 DIAGNOSIS — K3184 Gastroparesis: Secondary | ICD-10-CM | POA: Diagnosis present

## 2015-02-13 DIAGNOSIS — E1165 Type 2 diabetes mellitus with hyperglycemia: Secondary | ICD-10-CM

## 2015-02-13 LAB — CBC
HEMATOCRIT: 32.8 % — AB (ref 39.0–52.0)
HEMOGLOBIN: 11 g/dL — AB (ref 13.0–17.0)
MCH: 27.6 pg (ref 26.0–34.0)
MCHC: 33.5 g/dL (ref 30.0–36.0)
MCV: 82.4 fL (ref 78.0–100.0)
Platelets: 341 10*3/uL (ref 150–400)
RBC: 3.98 MIL/uL — ABNORMAL LOW (ref 4.22–5.81)
RDW: 13 % (ref 11.5–15.5)
WBC: 11.7 10*3/uL — ABNORMAL HIGH (ref 4.0–10.5)

## 2015-02-13 LAB — COMPREHENSIVE METABOLIC PANEL
ALBUMIN: 3.7 g/dL (ref 3.5–5.0)
ALT: 15 U/L — ABNORMAL LOW (ref 17–63)
ANION GAP: 20 — AB (ref 5–15)
AST: 21 U/L (ref 15–41)
Alkaline Phosphatase: 83 U/L (ref 38–126)
BUN: 40 mg/dL — ABNORMAL HIGH (ref 6–20)
CHLORIDE: 95 mmol/L — AB (ref 101–111)
CO2: 21 mmol/L — AB (ref 22–32)
Calcium: 9.8 mg/dL (ref 8.9–10.3)
Creatinine, Ser: 5.25 mg/dL — ABNORMAL HIGH (ref 0.61–1.24)
GFR calc Af Amer: 14 mL/min — ABNORMAL LOW (ref >60)
GFR calc non Af Amer: 12 mL/min — ABNORMAL LOW (ref >60)
GLUCOSE: 444 mg/dL — AB (ref 65–99)
POTASSIUM: 3.9 mmol/L (ref 3.5–5.1)
SODIUM: 136 mmol/L (ref 135–145)
TOTAL PROTEIN: 7.4 g/dL (ref 6.5–8.1)
Total Bilirubin: 0.8 mg/dL (ref 0.3–1.2)

## 2015-02-13 LAB — URINALYSIS, ROUTINE W REFLEX MICROSCOPIC
BILIRUBIN URINE: NEGATIVE
Glucose, UA: >1000 mg/dL — AB
Ketones, ur: 15 mg/dL — AB
Leukocytes, UA: NEGATIVE
NITRITE: NEGATIVE
PH: 6 (ref 5.0–8.0)
Protein, ur: >300 mg/dL — AB
SPECIFIC GRAVITY, URINE: 1.022 (ref 1.005–1.030)

## 2015-02-13 LAB — CBG MONITORING, ED
GLUCOSE-CAPILLARY: 352 mg/dL — AB (ref 65–99)
GLUCOSE-CAPILLARY: 331 mg/dL — AB (ref 65–99)
GLUCOSE-CAPILLARY: 262 mg/dL — AB (ref 65–99)
Glucose-Capillary: 391 mg/dL — ABNORMAL HIGH (ref 65–99)
Glucose-Capillary: 330 mg/dL — ABNORMAL HIGH (ref 65–99)

## 2015-02-13 LAB — URINE MICROSCOPIC-ADD ON

## 2015-02-13 LAB — I-STAT CG4 LACTIC ACID, ED
LACTIC ACID, VENOUS: 1.33 mmol/L (ref 0.5–2.0)
Lactic Acid, Venous: 1.22 mmol/L (ref 0.5–2.0)

## 2015-02-13 LAB — LIPASE, BLOOD: LIPASE: 23 U/L (ref 11–51)

## 2015-02-13 MED ORDER — TOBRAMYCIN 0.3 % OP SOLN: 1.0000 [drp] | Freq: Every day | OPHTHALMIC | Status: DC | PRN

## 2015-02-13 MED ORDER — PROMETHAZINE HCL 25 MG/ML IJ SOLN
25.0000 mg | Freq: Once | INTRAMUSCULAR | Status: AC
  Administered 2015-02-13: 25 mg via INTRAVENOUS
  Filled 2015-02-13: qty 1

## 2015-02-13 MED ORDER — SODIUM CHLORIDE 0.9 % IV BOLUS (SEPSIS)
1000.0000 mL | Freq: Once | INTRAVENOUS | Status: AC
  Administered 2015-02-13: 1000 mL via INTRAVENOUS

## 2015-02-13 MED ORDER — PROMETHAZINE HCL 25 MG PO TABS: 25.0000 mg | ORAL_TABLET | Freq: Four times a day (QID) | ORAL | Status: DC | PRN

## 2015-02-13 MED ORDER — CITALOPRAM HYDROBROMIDE 40 MG PO TABS
60.0000 mg | ORAL_TABLET | Freq: Every day | ORAL | Status: DC
  Administered 2015-02-14 – 2015-02-15 (×2): 60 mg via ORAL
  Filled 2015-02-13: qty 3
  Filled 2015-02-13: qty 1

## 2015-02-13 MED ORDER — ALBUTEROL SULFATE HFA 108 (90 BASE) MCG/ACT IN AERS: 1.0000 | INHALATION_SPRAY | Freq: Four times a day (QID) | RESPIRATORY_TRACT | Status: DC | PRN

## 2015-02-13 MED ORDER — CALCIUM ACETATE (PHOS BINDER) 667 MG PO CAPS
1334.0000 mg | ORAL_CAPSULE | Freq: Three times a day (TID) | ORAL | Status: DC
  Administered 2015-02-15 – 2015-02-16 (×4): 1334 mg via ORAL
  Filled 2015-02-13 (×4): qty 2

## 2015-02-13 MED ORDER — GABAPENTIN 100 MG PO CAPS
100.0000 mg | ORAL_CAPSULE | Freq: Every day | ORAL | Status: DC
  Administered 2015-02-14 – 2015-02-15 (×2): 100 mg via ORAL
  Filled 2015-02-13 (×2): qty 1

## 2015-02-13 MED ORDER — METOPROLOL SUCCINATE ER 100 MG PO TB24
100.0000 mg | ORAL_TABLET | Freq: Every day | ORAL | Status: DC
  Administered 2015-02-14 – 2015-02-16 (×4): 100 mg via ORAL
  Filled 2015-02-13 (×2): qty 2
  Filled 2015-02-13: qty 1
  Filled 2015-02-13 (×2): qty 2

## 2015-02-13 MED ORDER — CLONIDINE HCL 0.1 MG PO TABS
0.1000 mg | ORAL_TABLET | Freq: Every day | ORAL | Status: DC
  Administered 2015-02-14: 0.1 mg via ORAL
  Filled 2015-02-13 (×2): qty 1

## 2015-02-13 MED ORDER — ALPRAZOLAM 0.5 MG PO TABS: 0.5000 mg | ORAL_TABLET | Freq: Every evening | ORAL | Status: DC | PRN

## 2015-02-13 MED ORDER — HEPARIN SODIUM (PORCINE) 5000 UNIT/ML IJ SOLN
5000.0000 [IU] | Freq: Three times a day (TID) | INTRAMUSCULAR | Status: DC
  Administered 2015-02-15: 5000 [IU] via SUBCUTANEOUS
  Filled 2015-02-13 (×3): qty 1

## 2015-02-13 MED ORDER — ACETAMINOPHEN 500 MG PO TABS: 500.0000 mg | ORAL_TABLET | Freq: Four times a day (QID) | ORAL | Status: DC | PRN

## 2015-02-13 MED ORDER — POTASSIUM CHLORIDE 10 MEQ/100ML IV SOLN
10.0000 meq | INTRAVENOUS | Status: AC
  Administered 2015-02-14 (×2): 10 meq via INTRAVENOUS
  Filled 2015-02-13 (×2): qty 100

## 2015-02-13 MED ORDER — HYDROMORPHONE HCL 1 MG/ML IJ SOLN
1.0000 mg | Freq: Once | INTRAMUSCULAR | Status: AC
  Administered 2015-02-13: 1 mg via INTRAVENOUS
  Filled 2015-02-13: qty 1

## 2015-02-13 MED ORDER — DEXTROSE-NACL 5-0.45 % IV SOLN
INTRAVENOUS | Status: DC
  Administered 2015-02-14: 01:00:00 via INTRAVENOUS

## 2015-02-13 MED ORDER — SODIUM CHLORIDE 0.9 % IV SOLN
INTRAVENOUS | Status: DC
  Administered 2015-02-13: 19:00:00 via INTRAVENOUS

## 2015-02-13 MED ORDER — ONDANSETRON HCL 4 MG/2ML IJ SOLN
4.0000 mg | Freq: Four times a day (QID) | INTRAMUSCULAR | Status: DC | PRN
  Administered 2015-02-14: 4 mg via INTRAVENOUS
  Filled 2015-02-13: qty 2

## 2015-02-13 MED ORDER — IPRATROPIUM-ALBUTEROL 0.5-2.5 (3) MG/3ML IN SOLN: 3.0000 mL | Freq: Four times a day (QID) | RESPIRATORY_TRACT | Status: DC | PRN

## 2015-02-13 MED ORDER — AMLODIPINE BESYLATE 10 MG PO TABS
10.0000 mg | ORAL_TABLET | Freq: Every day | ORAL | Status: DC
  Administered 2015-02-14 – 2015-02-16 (×3): 10 mg via ORAL
  Filled 2015-02-13: qty 1
  Filled 2015-02-13: qty 2
  Filled 2015-02-13: qty 1

## 2015-02-13 MED ORDER — SUCRALFATE 1 GM/10ML PO SUSP: 1.0000 g | Freq: Every day | ORAL | Status: DC | PRN

## 2015-02-13 MED ORDER — ALBUTEROL SULFATE (2.5 MG/3ML) 0.083% IN NEBU: 2.5000 mg | INHALATION_SOLUTION | Freq: Four times a day (QID) | RESPIRATORY_TRACT | Status: DC | PRN

## 2015-02-13 MED ORDER — ONDANSETRON HCL 4 MG/2ML IJ SOLN
4.0000 mg | INTRAMUSCULAR | Status: AC
  Administered 2015-02-13: 4 mg via INTRAVENOUS
  Filled 2015-02-13: qty 2

## 2015-02-13 MED ORDER — SODIUM CHLORIDE 0.9 % IV SOLN
INTRAVENOUS | Status: DC
  Administered 2015-02-13: 2.7 [IU]/h via INTRAVENOUS
  Filled 2015-02-13: qty 2.5

## 2015-02-13 MED ORDER — CLONAZEPAM 1 MG PO TABS
2.0000 mg | ORAL_TABLET | Freq: Every day | ORAL | Status: DC
  Administered 2015-02-14 – 2015-02-15 (×2): 2 mg via ORAL
  Filled 2015-02-13 (×2): qty 2

## 2015-02-13 MED ORDER — LEVOTHYROXINE SODIUM 50 MCG PO TABS
50.0000 ug | ORAL_TABLET | Freq: Every day | ORAL | Status: DC
  Administered 2015-02-14 – 2015-02-16 (×3): 50 ug via ORAL
  Filled 2015-02-13 (×3): qty 1

## 2015-02-13 MED ORDER — SODIUM CHLORIDE 0.9 % IV SOLN: INTRAVENOUS | Status: DC

## 2015-02-13 MED ORDER — MYCOPHENOLATE MOFETIL 500 MG PO TABS: 500.0000 mg | ORAL_TABLET | Freq: Every day | ORAL | Status: DC

## 2015-02-13 MED ORDER — CYCLOBENZAPRINE HCL 10 MG PO TABS
10.0000 mg | ORAL_TABLET | Freq: Every evening | ORAL | Status: DC | PRN
  Administered 2015-02-15: 10 mg via ORAL
  Filled 2015-02-13: qty 1

## 2015-02-13 MED ORDER — DEXTROSE-NACL 5-0.45 % IV SOLN: INTRAVENOUS | Status: DC

## 2015-02-13 NOTE — ED Notes (Addendum)
Pt c/o vomiting, dizziness and fever onset yesterday around 7pm. Pt had dialysis yesterday and was feeling weak after. Pt tried phenergan at home without relief.

## 2015-02-13 NOTE — ED Notes (Addendum)
Patient returned from CT

## 2015-02-13 NOTE — ED Provider Notes (Signed)
CSN: [id number]     Arrival date & time 02/13/15  1513 History   First MD Initiated Contact with Patient 02/13/15 1633     Chief Complaint  Patient presents with  . Emesis  . Fever  . Dizziness    HPI   Russell [name] is a 45 y.o. male with a PMH of HTN, DM, ESRD on HD, anxiety, depression, DVT who presents to the ED with abdominal pain, flank pain, nausea, and vomiting, which he states started around 7 PM yesterday and has been constant since that time. He denies exacerbating factors. He has tried phenergan at home for symptom relief, however notes he has been unable to keep this down. He states he had dialysis yesterday, and states he felt tired afterward, though denies additional complication. He reports subjective fever and chills. He denies chest pain, shortness of breath, diarrhea, constipation, dysuria, urgency, frequency.    Past Medical History  Diagnosis Date  . Hypertension   . DVT (deep venous thrombosis), H/o 01/2014-on Xarelto 03/24/2014    LLE  . Secondary DM with DKA-AG=16, BIcarb Nl 03/24/2014  . Age-related macular degeneration, wet, both eyes (HCC)     "I'm getting Alia treatments" (12/30/2014)  . Hypothyroidism   . Asthma   . Pneumonia 11/2013  . Daily headache   . Arthritis     "hands" (12/30/2014)  . Anxiety   . Depression   . Type 2 diabetes mellitus with diabetic nephropathy (HCC)   . Nephrotic syndrome 05/18/2014  . 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 Ankle fracture surgery Right 1988  . Eye surgery    . Esophagogastroduodenoscopy N/A 03/29/2014    Procedure: ESOPHAGOGASTRODUODENOSCOPY (EGD);  Surgeon: Dorena Cookey, MD;  Location: [facility];  Service: Endoscopy;  Laterality: N/A;  . Tonsillectomy and adenoidectomy  1970s  . Fracture surgery    . Av fistula placement Left 01/01/2015    Procedure: CREATION OF LEFT RADIAL CEPHALIC ARTERIOVENOUS (AV) FISTULA ;  Surgeon: Pryor Ochoa,  MD;  Location: [facility] OR;  Service: Vascular;  Laterality: Left;  . Insertion of dialysis catheter Right 01/05/2015    Procedure: INSERTION OF RIGHT INTERNAL JUGULAR DIALYSIS CATHETER;  Surgeon: Fransisco Hertz, MD;  Location: [facility] OR;  Service: Vascular;  Laterality: Right;   Family History  Problem Relation Age of Onset  . Obesity Mother     Patient states that family members have no other medical illnesses other than what I have described  . Heart disease      No family history  . Cancer Father 50    AML  . Kidney cancer Maternal Grandmother    Social History  Substance Use Topics  . Smoking status: Never Smoker   . Smokeless tobacco: Never Used  . Alcohol Use: No      Review of Systems  Constitutional: Positive for fever and chills.  Respiratory: Negative for shortness of breath.   Cardiovascular: Negative for chest pain.  Gastrointestinal: Positive for nausea, vomiting and abdominal pain. Negative for diarrhea and constipation.  Genitourinary: Positive for flank pain. Negative for dysuria, urgency and frequency.  All other systems reviewed and are negative.     Allergies  Ibuprofen and Nsaids  Home Medications   Prior to Admission medications   Medication Sig Start Date End Date Taking? Authorizing Provider  acetaminophen (TYLENOL) 500 MG tablet Take 1 tablet (500 mg total) by mouth every 6 (  six) hours as needed for moderate pain. 09/29/14  Yes Albertine Grates, MD  albuterol (PROVENTIL HFA;VENTOLIN HFA) 108 (90 BASE) MCG/ACT inhaler Inhale 1 puff into the lungs every 6 (six) hours as needed for wheezing or shortness of breath.   Yes Historical Provider, MD  albuterol (PROVENTIL) (2.5 MG/3ML) 0.083% nebulizer solution Take 3 mLs (2.5 mg total) by nebulization every 6 (six) hours as needed for wheezing or shortness of breath. 09/03/14  Yes [name], MD  albuterol-ipratropium (COMBIVENT) 18-103 MCG/ACT inhaler Inhale 2 puffs into the lungs every 4 (four) hours. Patient taking  differently: Inhale 2 puffs into the lungs every 6 (six) hours as needed for wheezing or shortness of breath. Take as needed 09/03/14  Yes [name], MD  ALPRAZolam Prudy Feeler) 0.5 MG tablet TAKE 1 TABLET TWICE DAILY AS NEEDED FOR SLEEP Patient taking differently: Take 0.5 mg by mouth at bedtime as needed for sleep.  10/12/14  Yes [name], MD  amLODipine (NORVASC) 10 MG tablet Take 1 tablet (10 mg total) by mouth daily. 11/21/14  Yes Campbell Stall, MD  calcium acetate (PHOSLO) 667 MG capsule Take 2 capsules (1,334 mg total) by mouth 3 (three) times daily with meals. 01/08/15  Yes Rolly Salter, MD  citalopram (CELEXA) 40 MG tablet Take 60 mg by mouth at bedtime.  12/21/14  Yes Historical Provider, MD  clonazePAM (KLONOPIN) 1 MG tablet Take 2 mg by mouth at bedtime.    Yes Historical Provider, MD  cloNIDine (CATAPRES) 0.1 MG tablet Take 1 tablet (0.1 mg total) by mouth 2 (two) times daily. Patient taking differently: Take 0.1 mg by mouth daily.  01/08/15  Yes Rolly Salter, MD  cyclobenzaprine (FLEXERIL) 10 MG tablet Take 1 tablet (10 mg total) by mouth at bedtime. Patient taking differently: Take 10 mg by mouth at bedtime as needed for muscle spasms.  10/12/14  Yes [name], MD  gabapentin (NEURONTIN) 100 MG capsule Take 1 capsule (100 mg total) by mouth at bedtime. 09/29/14  Yes Albertine Grates, MD  HYDROmorphone (DILAUDID) 2 MG tablet Take 1 tablet (2 mg total) by mouth every 6 (six) hours as needed for severe pain. 11/24/14  Yes [name], MD  Insulin Lispro (HUMALOG KWIKPEN) 200 UNIT/ML SOPN See admin instructions. 1 unit per 5 grams of carbohydrates with meals/snacks plus coverage scale. Total 50 units/day 12/16/13  Yes Historical Provider, MD  LANTUS SOLOSTAR 100 UNIT/ML Solostar Pen Inject 40 Units into the skin daily. Patient taking differently: Inject 30 Units into the skin daily.  01/08/15  Yes Rolly Salter, MD  levothyroxine (SYNTHROID, LEVOTHROID) 50 MCG tablet Take 1 tablet  (50 mcg total) by mouth daily. 05/02/13  Yes [name], MD  metoprolol succinate (TOPROL-XL) 100 MG 24 hr tablet Take 1 tablet (100 mg total) by mouth daily. 05/20/14  Yes Lewayne Bunting, MD  mycophenolate (CELLCEPT) 500 MG tablet Take 500 mg by mouth daily. 12/28/14  Yes Historical Provider, MD  promethazine (PHENERGAN) 25 MG tablet Take 1 tablet (25 mg total) by mouth every 6 (six) hours as needed for nausea. 11/21/14  Yes Campbell Stall, MD  sucralfate (CARAFATE) 1 GM/10ML suspension Take 1 g by mouth daily as needed (for throat irritation).    Yes Historical Provider, MD  tobramycin (TOBREX) 0.3 % ophthalmic solution Place 1 drop into both eyes every 4 (four) hours. Patient taking differently: Place 1 drop into both eyes daily as needed (for eye injections).  11/24/14  Yes [name], MD  BP 138/71 mmHg  Pulse 95  Temp(Src) 97.8 F (36.6 C) (Oral)  Resp 16  Ht  (1.88 m)  Wt 113.399 kg  BMI 32.08 kg/m2  SpO2 94% Physical Exam  Constitutional: He is oriented to person, place, and time. He appears well-developed and well-nourished. He appears distressed.  Patient appears uncomfortable due to pain.  HENT:  Head: Normocephalic and atraumatic.  Right Ear: External ear normal.  Left Ear: External ear normal.  Nose: Nose normal.  Mouth/Throat: Uvula is midline, oropharynx is clear and moist and mucous membranes are normal.  Eyes: Conjunctivae, EOM and lids are normal. Pupils are equal, round, and reactive to light. Right eye exhibits no discharge. Left eye exhibits no discharge. No scleral icterus.  Neck: Normal range of motion. Neck supple.  Cardiovascular: Normal rate, regular rhythm, normal heart sounds, intact distal pulses and normal pulses.   Pulmonary/Chest: Effort normal and breath sounds normal. No respiratory distress. He has no wheezes. He has no rales.  Abdominal: Soft. Normal appearance and bowel sounds are normal. He exhibits no distension and no mass. There  is tenderness. There is no rigidity, no rebound and no guarding.  TTP in RUQ. Right sided CVA tenderness.  Musculoskeletal: Normal range of motion. He exhibits no edema or tenderness.  Neurological: He is alert and oriented to person, place, and time.  Skin: Skin is warm, dry and intact. No rash noted. He is not diaphoretic. No erythema. No pallor.  Psychiatric: He has a normal mood and affect. His speech is normal and behavior is normal.  Nursing note and vitals reviewed.   ED Course  Procedures (including critical care time)  Labs Review Labs Reviewed  COMPREHENSIVE METABOLIC PANEL - Abnormal; Notable for the following:    Chloride 95 (*)    CO2 21 (*)    Glucose, Bld 444 (*)    BUN 40 (*)    Creatinine, Ser 5.25 (*)    ALT 15 (*)    GFR calc non Af Amer 12 (*)    GFR calc Af Amer 14 (*)    Anion gap 20 (*)    All other components within normal limits  CBC - Abnormal; Notable for the following:    WBC 11.7 (*)    RBC 3.98 (*)    Hemoglobin 11.0 (*)    HCT 32.8 (*)    All other components within normal limits  URINALYSIS, ROUTINE W REFLEX MICROSCOPIC (NOT AT [facility]) - Abnormal; Notable for the following:    Glucose, UA >1000 (*)    Hgb urine dipstick SMALL (*)    Ketones, ur 15 (*)    Protein, ur >300 (*)    All other components within normal limits  URINE MICROSCOPIC-ADD ON - Abnormal; Notable for the following:    Squamous Epithelial / LPF 0-5 (*)    Bacteria, UA MANY (*)    All other components within normal limits  CBG MONITORING, ED - Abnormal; Notable for the following:    Glucose-Capillary 391 (*)    All other components within normal limits  CBG MONITORING, ED - Abnormal; Notable for the following:    Glucose-Capillary 330 (*)    All other components within normal limits  CBG MONITORING, ED - Abnormal; Notable for the following:    Glucose-Capillary 352 (*)    All other components within normal limits  CBG MONITORING, ED - Abnormal; Notable for the following:     Glucose-Capillary 331 (*)    All other components within  normal limits  CBG MONITORING, ED - Abnormal; Notable for the following:    Glucose-Capillary 262 (*)    All other components within normal limits  LIPASE, BLOOD  BASIC METABOLIC PANEL  BASIC METABOLIC PANEL  BASIC METABOLIC PANEL  I-STAT CG4 LACTIC ACID, ED  I-STAT CG4 LACTIC ACID, ED    Imaging Review Ct Abdomen Pelvis Wo Contrast  02/13/2015  CLINICAL DATA:  Flank pain radiating to upper abdomen, vomiting, dizziness and fever onset yesterday. Weakness after dialysis yesterday. EXAM: CT ABDOMEN AND PELVIS WITHOUT CONTRAST TECHNIQUE: Multidetector CT imaging of the abdomen and pelvis was performed following the standard protocol without IV contrast. COMPARISON:  CT abdomen dated 09/30/2014. FINDINGS: Lower chest: No acute findings. Small hiatal hernia. Lung bases are clear. Hepatobiliary: No mass visualized on this un-enhanced exam. Pancreas: Partially infiltrated with fat but otherwise unremarkable. Spleen: Within normal limits in size. Adrenals/Urinary Tract: Adrenal glands appear normal. 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 No renal stone or hydronephrosis bilaterally. No ureteral or bladder calculi identified. Bladder appears normal. Stomach/Bowel: Bowel is normal in caliber. No bowel wall thickening or evidence of bowel wall inflammation. Appendix is normal. Vascular/Lymphatic: No pathologically enlarged lymph nodes. No evidence of abdominal aortic aneurysm. Reproductive: No mass or other significant abnormality. Other: No free fluid or abscess collection. No free intraperitoneal air. Musculoskeletal: Mild degenerative spurring within the lumbar spine. Mild disc bulges at multiple levels without significant central canal stenosis. No acute osseous abnormality. Superficial soft tissues are unremarkable. IMPRESSION: 1. No acute findings. 2. Small hiatal hernia. 3. Horseshoe kidneys. No acute renal abnormality. No renal or ureteral  calculi. 4. Mild degenerative change in the lumbar spine. Electronically Signed   By: Bary Richard M.D.   On: 02/13/2015 17:55   I have personally reviewed and evaluated these images and lab results as part of my medical decision-making.   EKG Interpretation None      MDM   Final diagnoses:  Abdominal pain    45 year old male presents with abdominal pain, right flank pain, nausea, and vomiting, which he states started last night. Reports subjective fever and chills. Denies chest pain, shortness of breath, diarrhea, constipation, dysuria, urgency, frequency. Last dialysis session was yesterday.  Patient is afebrile. Vital signs stable. Abdomen soft, with mild tenderness to palpation in right upper quadrant. No rebound guarding, or masses. Right-sided CVA tenderness present on exam.   CBC remarkable for leukocytosis of 11.7, hemoglobin 11. CMP remarkable for creatinine 5.24, glucose 444, anion gap 20. Lipase within normal limits. Lactic acid within normal limits. CT abdomen pelvis negative for acute findings.  Patient given fluids and started on insulin drip per DKA protocol. Patient given pain medication and antiemetic. On reassessment, he reports mild symptom improvement. UA pending. Anticipate admission.  Patient continues to complain of nausea. Given phenergan. UA negative for infection, remarkable for glucose, ketones, and protein. Hospitalist consulted for admission. Patient discussed with Dr. Julian Reil, who will admit for further evaluation and management. Patient discussed with Dr. Deretha Emory.  BP 138/71 mmHg  Pulse 95  Temp(Src) 97.8 F (36.6 C) (Oral)  Resp 16  Ht 6\' 2"  (1.88 m)  Wt 113.399 kg  BMI 32.08 kg/m2  SpO2 94%       Mady Gemma, PA-C 02/13/15 2322  Vanetta Mulders, MD 02/14/15 1717

## 2015-02-13 NOTE — ED Notes (Signed)
reiterated to pt of the need of a urine specimen. Will check back with pt in 15 mins

## 2015-02-13 NOTE — ED Notes (Signed)
Attempted to call report

## 2015-02-13 NOTE — H&P (Signed)
[facility] History and Physical  Russell [name] ZOX:[id number] DOB: 1970-03-17 DOA: 02/13/2015  Referring physician: EDP PCP: [name], MD   Chief Complaint: N/V   HPI: Russell [name] is a 45 y.o. male with h/o DM, ESRD with dialysis M/W/F, last dialysis on Friday.  Patient presents to ED with severe N/V that onset last night.  Symptoms have persisted throughout the day.  He tried PO phenergan at home but vomited this up.  There was some suspicion in past of diabetic gastroparesis but he had normal gastric emptying study in April of last year when this was checked.  Work up today shows DKA with anion gap of 20.  Review of Systems: Systems reviewed.  As above, otherwise negative  Past Medical History  Diagnosis Date  . Hypertension   . DVT (deep venous thrombosis), H/o 01/2014-on Xarelto 03/24/2014    LLE  . Secondary DM with DKA-AG=16, BIcarb Nl 03/24/2014  . Age-related macular degeneration, wet, both eyes (HCC)     "I'm getting Alia treatments" (12/30/2014)  . Hypothyroidism   . Asthma   . Pneumonia 11/2013  . Daily headache   . Arthritis     "hands" (12/30/2014)  . Anxiety   . Depression   . Type 2 diabetes mellitus with diabetic nephropathy (HCC)   . Nephrotic syndrome 05/18/2014  . CKD stage 3 due to type 2 diabetes mellitus [facility])    Past Surgical History  Procedure Laterality Date  . Ankle fracture surgery Right 1988  . Eye surgery    . Esophagogastroduodenoscopy N/A 03/29/2014    Procedure: ESOPHAGOGASTRODUODENOSCOPY (EGD);  Surgeon: Dorena Cookey, MD;  Location: [facility];  Service: Endoscopy;  Laterality: N/A;  . Tonsillectomy and adenoidectomy  1970s  . Fracture surgery    . Av fistula placement Left 01/01/2015    Procedure: CREATION OF LEFT RADIAL CEPHALIC ARTERIOVENOUS (AV) FISTULA ;  Surgeon: Pryor Ochoa, MD;  Location: [facility] OR;  Service: Vascular;  Laterality: Left;  . Insertion of dialysis catheter Right 01/05/2015    Procedure: INSERTION OF  RIGHT INTERNAL JUGULAR DIALYSIS CATHETER;  Surgeon: Fransisco Hertz, MD;  Location: [facility] OR;  Service: Vascular;  Laterality: Right;   Social History:  reports that he has never smoked. He has never used smokeless tobacco. He reports that he does not drink alcohol or use illicit drugs.  Allergies  Allergen Reactions  . Ibuprofen Other (See Comments)    MD told him not to take due to kidney disease.  . Nsaids Other (See Comments)    Told to avoid all nsaids due to kidney disease-takes acetaminophen as needed    Family History  Problem Relation Age of Onset  . Obesity Mother     Patient states that family members have no other medical illnesses other than what I have described  . Heart disease      No family history  . Cancer Father 50    AML  . Kidney cancer Maternal Grandmother      Prior to Admission medications   Medication Sig Start Date End Date Taking? Authorizing Provider  acetaminophen (TYLENOL) 500 MG tablet Take 1 tablet (500 mg total) by mouth every 6 (six) hours as needed for moderate pain. 09/29/14  Yes Albertine Grates, MD  albuterol (PROVENTIL HFA;VENTOLIN HFA) 108 (90 BASE) MCG/ACT inhaler Inhale 1 puff into the lungs every 6 (six) hours as needed for wheezing or shortness of breath.   Yes Historical Provider, MD  albuterol (PROVENTIL) (2.5 MG/3ML) 0.083% nebulizer  solution Take 3 mLs (2.5 mg total) by nebulization every 6 (six) hours as needed for wheezing or shortness of breath. 09/03/14  Yes [name], MD  albuterol-ipratropium (COMBIVENT) 18-103 MCG/ACT inhaler Inhale 2 puffs into the lungs every 4 (four) hours. Patient taking differently: Inhale 2 puffs into the lungs every 6 (six) hours as needed for wheezing or shortness of breath. Take as needed 09/03/14  Yes [name], MD  ALPRAZolam Prudy Feeler) 0.5 MG tablet TAKE 1 TABLET TWICE DAILY AS NEEDED FOR SLEEP Patient taking differently: Take 0.5 mg by mouth at bedtime as needed for sleep.  10/12/14  Yes [name], MD   amLODipine (NORVASC) 10 MG tablet Take 1 tablet (10 mg total) by mouth daily. 11/21/14  Yes Campbell Stall, MD  calcium acetate (PHOSLO) 667 MG capsule Take 2 capsules (1,334 mg total) by mouth 3 (three) times daily with meals. 01/08/15  Yes Rolly Salter, MD  citalopram (CELEXA) 40 MG tablet Take 60 mg by mouth at bedtime.  12/21/14  Yes Historical Provider, MD  clonazePAM (KLONOPIN) 1 MG tablet Take 2 mg by mouth at bedtime.    Yes Historical Provider, MD  cloNIDine (CATAPRES) 0.1 MG tablet Take 1 tablet (0.1 mg total) by mouth 2 (two) times daily. Patient taking differently: Take 0.1 mg by mouth daily.  01/08/15  Yes Rolly Salter, MD  cyclobenzaprine (FLEXERIL) 10 MG tablet Take 1 tablet (10 mg total) by mouth at bedtime. Patient taking differently: Take 10 mg by mouth at bedtime as needed for muscle spasms.  10/12/14  Yes [name], MD  gabapentin (NEURONTIN) 100 MG capsule Take 1 capsule (100 mg total) by mouth at bedtime. 09/29/14  Yes Albertine Grates, MD  HYDROmorphone (DILAUDID) 2 MG tablet Take 1 tablet (2 mg total) by mouth every 6 (six) hours as needed for severe pain. 11/24/14  Yes [name], MD  Insulin Lispro (HUMALOG KWIKPEN) 200 UNIT/ML SOPN See admin instructions. 1 unit per 5 grams of carbohydrates with meals/snacks plus coverage scale. Total 50 units/day 12/16/13  Yes Historical Provider, MD  LANTUS SOLOSTAR 100 UNIT/ML Solostar Pen Inject 40 Units into the skin daily. Patient taking differently: Inject 30 Units into the skin daily.  01/08/15  Yes Rolly Salter, MD  levothyroxine (SYNTHROID, LEVOTHROID) 50 MCG tablet Take 1 tablet (50 mcg total) by mouth daily. 05/02/13  Yes [name], MD  metoprolol succinate (TOPROL-XL) 100 MG 24 hr tablet Take 1 tablet (100 mg total) by mouth daily. 05/20/14  Yes Lewayne Bunting, MD  mycophenolate (CELLCEPT) 500 MG tablet Take 500 mg by mouth daily. 12/28/14  Yes Historical Provider, MD  promethazine (PHENERGAN) 25 MG tablet Take 1 tablet  (25 mg total) by mouth every 6 (six) hours as needed for nausea. 11/21/14  Yes Campbell Stall, MD  sucralfate (CARAFATE) 1 GM/10ML suspension Take 1 g by mouth daily as needed (for throat irritation).    Yes Historical Provider, MD  tobramycin (TOBREX) 0.3 % ophthalmic solution Place 1 drop into both eyes every 4 (four) hours. Patient taking differently: Place 1 drop into both eyes daily as needed (for eye injections).  11/24/14  Yes [name], MD   Physical Exam: Filed Vitals:   02/13/15 2100 02/13/15 2130  BP: 153/82 138/71  Pulse: 97 95  Temp:    Resp:      BP 138/71 mmHg  Pulse 95  Temp(Src) 97.8 F (36.6 C) (Oral)  Resp 16  Ht 6\' 2"  (1.88 m)  Wt 113.399 kg (250 lb)  BMI 32.08 kg/m2  SpO2 94%  General Appearance:    Alert, oriented, no distress, appears stated age  Head:    Normocephalic, atraumatic  Eyes:    PERRL, EOMI, sclera non-icteric        Nose:   Nares without drainage or epistaxis. Mucosa, turbinates normal  Throat:   Moist mucous membranes. Oropharynx without erythema or exudate.  Neck:   Supple. No carotid bruits.  No thyromegaly.  No lymphadenopathy.   Back:     No CVA tenderness, no spinal tenderness  Lungs:     Clear to auscultation bilaterally, without wheezes, rhonchi or rales  Chest wall:    No tenderness to palpitation  Heart:    Regular rate and rhythm without murmurs, gallops, rubs  Abdomen:     Soft, non-tender, nondistended, normal bowel sounds, no organomegaly  Genitalia:    deferred  Rectal:    deferred  Extremities:   No clubbing, cyanosis or edema.  Pulses:   2+ and symmetric all extremities  Skin:   Skin color, texture, turgor normal, no rashes or lesions  Lymph nodes:   Cervical, supraclavicular, and axillary nodes normal  Neurologic:   CNII-XII intact. Normal strength, sensation and reflexes      throughout    Labs on Admission:  Basic Metabolic Panel:  Recent Labs Lab 02/13/15 1556  NA 136  K 3.9  CL 95*  CO2 21*   GLUCOSE 444*  BUN 40*  CREATININE 5.25*  CALCIUM 9.8   Liver Function Tests:  Recent Labs Lab 02/13/15 1556  AST 21  ALT 15*  ALKPHOS 83  BILITOT 0.8  PROT 7.4  ALBUMIN 3.7    Recent Labs Lab 02/13/15 1556  LIPASE 23   No results for input(s): AMMONIA in the last 168 hours. CBC:  Recent Labs Lab 02/13/15 1556  WBC 11.7*  HGB 11.0*  HCT 32.8*  MCV 82.4  PLT 341   Cardiac Enzymes: No results for input(s): CKTOTAL, CKMB, CKMBINDEX, TROPONINI in the last 168 hours.  BNP (last 3 results) No results for input(s): PROBNP in the last 8760 hours. CBG:  Recent Labs Lab 02/13/15 1647 02/13/15 1917 02/13/15 2026 02/13/15 2133 02/13/15 2247  GLUCAP 391* 330* 352* 331* 262*    Radiological Exams on Admission: Ct Abdomen Pelvis Wo Contrast  02/13/2015  CLINICAL DATA:  Flank pain radiating to upper abdomen, vomiting, dizziness and fever onset yesterday. Weakness after dialysis yesterday. EXAM: CT ABDOMEN AND PELVIS WITHOUT CONTRAST TECHNIQUE: Multidetector CT imaging of the abdomen and pelvis was performed following the standard protocol without IV contrast. COMPARISON:  CT abdomen dated 09/30/2014. FINDINGS: Lower chest: No acute findings. Small hiatal hernia. Lung bases are clear. Hepatobiliary: No mass visualized on this un-enhanced exam. Pancreas: Partially infiltrated with fat but otherwise unremarkable. Spleen: Within normal limits in size. Adrenals/Urinary Tract: Adrenal glands appear normal. 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 No renal stone or hydronephrosis bilaterally. No ureteral or bladder calculi identified. Bladder appears normal. Stomach/Bowel: Bowel is normal in caliber. No bowel wall thickening or evidence of bowel wall inflammation. Appendix is normal. Vascular/Lymphatic: No pathologically enlarged lymph nodes. No evidence of abdominal aortic aneurysm. Reproductive: No mass or other significant abnormality. Other: No free fluid or abscess collection. No  free intraperitoneal air. Musculoskeletal: Mild degenerative spurring within the lumbar spine. Mild disc bulges at multiple levels without significant central canal stenosis. No acute osseous abnormality. Superficial soft tissues are unremarkable. IMPRESSION: 1. No acute findings. 2.  Small hiatal hernia. 3. Horseshoe kidneys. No acute renal abnormality. No renal or ureteral calculi. 4. Mild degenerative change in the lumbar spine. Electronically Signed   By: Bary Richard M.D.   On: 02/13/2015 17:55    EKG: Independently reviewed.  Assessment/Plan Principal Problem:   DKA (diabetic ketoacidoses) (HCC) Active Problems:   Uncontrolled diabetes mellitus type 2 with peripheral artery disease (HCC)   Essential hypertension, benign   ESRD on dialysis (HCC)   Nausea and vomiting   1. DKA - 1. Insulin gtt 2. dka pathway 3. Labs per pathway 2. N/V - 1. Prn zofran 2. Phenergan if that dosent work 3. Would consider empiric reglan for possible gastroparesis if N/V continues despite resolution of DKA 3. HTN - 1. Continue home meds 4. ESRD - 1. Next dialysis due Monday 2. Call and let nephrologist know about patients admit tomorrow AM, the [phone] number that we usually leave voicemail on doesn't appear to be working tonight.    Code Status: Full  Family Communication: Family at bedside Disposition Plan: Admit to inpatient   Time spent: 70 min  GARDNER, JARED M. [facility] Pager [phone]  If 7AM-7PM, please contact the day team taking care of the patient [email or web address] Password [email or web address] 02/13/2015, 11:16 PM

## 2015-02-14 DIAGNOSIS — E1165 Type 2 diabetes mellitus with hyperglycemia: Secondary | ICD-10-CM

## 2015-02-14 DIAGNOSIS — Z992 Dependence on renal dialysis: Secondary | ICD-10-CM

## 2015-02-14 DIAGNOSIS — E1151 Type 2 diabetes mellitus with diabetic peripheral angiopathy without gangrene: Secondary | ICD-10-CM

## 2015-02-14 DIAGNOSIS — E1010 Type 1 diabetes mellitus with ketoacidosis without coma: Secondary | ICD-10-CM

## 2015-02-14 DIAGNOSIS — N186 End stage renal disease: Secondary | ICD-10-CM

## 2015-02-14 DIAGNOSIS — G43A1 Cyclical vomiting, intractable: Secondary | ICD-10-CM

## 2015-02-14 LAB — GLUCOSE, CAPILLARY
GLUCOSE-CAPILLARY: 118 mg/dL — AB (ref 65–99)
GLUCOSE-CAPILLARY: 133 mg/dL — AB (ref 65–99)
GLUCOSE-CAPILLARY: 140 mg/dL — AB (ref 65–99)
GLUCOSE-CAPILLARY: 148 mg/dL — AB (ref 65–99)
GLUCOSE-CAPILLARY: 153 mg/dL — AB (ref 65–99)
GLUCOSE-CAPILLARY: 210 mg/dL — AB (ref 65–99)
Glucose-Capillary: 177 mg/dL — ABNORMAL HIGH (ref 65–99)
Glucose-Capillary: 159 mg/dL — ABNORMAL HIGH (ref 65–99)
Glucose-Capillary: 145 mg/dL — ABNORMAL HIGH (ref 65–99)
Glucose-Capillary: 151 mg/dL — ABNORMAL HIGH (ref 65–99)
Glucose-Capillary: 145 mg/dL — ABNORMAL HIGH (ref 65–99)
Glucose-Capillary: 149 mg/dL — ABNORMAL HIGH (ref 65–99)
Glucose-Capillary: 151 mg/dL — ABNORMAL HIGH (ref 65–99)
Glucose-Capillary: 160 mg/dL — ABNORMAL HIGH (ref 65–99)
Glucose-Capillary: 160 mg/dL — ABNORMAL HIGH (ref 65–99)

## 2015-02-14 LAB — BASIC METABOLIC PANEL
Anion gap: 15 (ref 5–15)
Anion gap: 14 (ref 5–15)
Anion gap: 16 — ABNORMAL HIGH (ref 5–15)
Anion gap: 18 — ABNORMAL HIGH (ref 5–15)
Anion gap: 14 (ref 5–15)
BUN: 41 mg/dL — AB (ref 6–20)
BUN: 43 mg/dL — AB (ref 6–20)
BUN: 44 mg/dL — AB (ref 6–20)
BUN: 47 mg/dL — AB (ref 6–20)
BUN: 48 mg/dL — AB (ref 6–20)
CHLORIDE: 106 mmol/L (ref 101–111)
CHLORIDE: 106 mmol/L (ref 101–111)
CHLORIDE: 105 mmol/L (ref 101–111)
CO2: 22 mmol/L (ref 22–32)
CO2: 23 mmol/L (ref 22–32)
CO2: 23 mmol/L (ref 22–32)
CO2: 24 mmol/L (ref 22–32)
CO2: 25 mmol/L (ref 22–32)
CREATININE: 4.86 mg/dL — AB (ref 0.61–1.24)
CREATININE: 5 mg/dL — AB (ref 0.61–1.24)
CREATININE: 5.07 mg/dL — AB (ref 0.61–1.24)
CREATININE: 5.09 mg/dL — AB (ref 0.61–1.24)
CREATININE: 5.21 mg/dL — AB (ref 0.61–1.24)
Calcium: 9.7 mg/dL (ref 8.9–10.3)
Calcium: 9.5 mg/dL (ref 8.9–10.3)
Calcium: 9.9 mg/dL (ref 8.9–10.3)
Calcium: 9.8 mg/dL (ref 8.9–10.3)
Calcium: 9.6 mg/dL (ref 8.9–10.3)
Chloride: 106 mmol/L (ref 101–111)
Chloride: 106 mmol/L (ref 101–111)
GFR calc Af Amer: 15 mL/min — ABNORMAL LOW (ref >60)
GFR calc Af Amer: 15 mL/min — ABNORMAL LOW (ref >60)
GFR calc Af Amer: 15 mL/min — ABNORMAL LOW (ref >60)
GFR calc Af Amer: 15 mL/min — ABNORMAL LOW (ref >60)
GFR calc Af Amer: 14 mL/min — ABNORMAL LOW (ref >60)
GFR calc non Af Amer: 13 mL/min — ABNORMAL LOW (ref >60)
GFR calc non Af Amer: 13 mL/min — ABNORMAL LOW (ref >60)
GFR calc non Af Amer: 12 mL/min — ABNORMAL LOW (ref >60)
GFR, EST NON AFRICAN AMERICAN: 13 mL/min — AB (ref >60)
GFR, EST NON AFRICAN AMERICAN: 13 mL/min — AB (ref >60)
GLUCOSE: 172 mg/dL — AB (ref 65–99)
GLUCOSE: 150 mg/dL — AB (ref 65–99)
GLUCOSE: 159 mg/dL — AB (ref 65–99)
Glucose, Bld: 210 mg/dL — ABNORMAL HIGH (ref 65–99)
Glucose, Bld: 283 mg/dL — ABNORMAL HIGH (ref 65–99)
Potassium: 3.5 mmol/L (ref 3.5–5.1)
Potassium: 3.3 mmol/L — ABNORMAL LOW (ref 3.5–5.1)
Potassium: 3.8 mmol/L (ref 3.5–5.1)
Potassium: 4 mmol/L (ref 3.5–5.1)
Potassium: 3.8 mmol/L (ref 3.5–5.1)
SODIUM: 143 mmol/L (ref 135–145)
SODIUM: 143 mmol/L (ref 135–145)
SODIUM: 144 mmol/L (ref 135–145)
SODIUM: 146 mmol/L — AB (ref 135–145)
SODIUM: 147 mmol/L — AB (ref 135–145)

## 2015-02-14 LAB — MRSA PCR SCREENING: MRSA by PCR: NEGATIVE

## 2015-02-14 MED ORDER — METOCLOPRAMIDE HCL 5 MG/ML IJ SOLN
5.0000 mg | Freq: Four times a day (QID) | INTRAMUSCULAR | Status: DC
  Administered 2015-02-14 – 2015-02-15 (×4): 5 mg via INTRAVENOUS
  Filled 2015-02-14 (×4): qty 2

## 2015-02-14 MED ORDER — HYDRALAZINE HCL 20 MG/ML IJ SOLN
10.0000 mg | Freq: Once | INTRAMUSCULAR | Status: AC
  Administered 2015-02-14: 10 mg via INTRAVENOUS
  Filled 2015-02-14: qty 1

## 2015-02-14 MED ORDER — CLONIDINE HCL 0.1 MG PO TABS
0.1000 mg | ORAL_TABLET | Freq: Two times a day (BID) | ORAL | Status: DC
  Administered 2015-02-14 – 2015-02-16 (×4): 0.1 mg via ORAL
  Filled 2015-02-14 (×5): qty 1

## 2015-02-14 MED ORDER — HYDROMORPHONE HCL 1 MG/ML IJ SOLN: 2.0000 mg | Freq: Four times a day (QID) | INTRAMUSCULAR | Status: DC | PRN

## 2015-02-14 MED ORDER — DEXTROSE 5 % IV SOLN
INTRAVENOUS | Status: DC
  Administered 2015-02-14: 11:00:00 via INTRAVENOUS

## 2015-02-14 MED ORDER — METOCLOPRAMIDE HCL 5 MG/ML IJ SOLN: 5.0000 mg | Freq: Four times a day (QID) | INTRAMUSCULAR | Status: DC

## 2015-02-14 MED ORDER — METOCLOPRAMIDE HCL 5 MG/ML IJ SOLN
10.0000 mg | Freq: Once | INTRAMUSCULAR | Status: AC
  Administered 2015-02-14: 10 mg via INTRAVENOUS
  Filled 2015-02-14: qty 2

## 2015-02-14 MED ORDER — METOCLOPRAMIDE HCL 5 MG/ML IJ SOLN: 10.0000 mg | Freq: Four times a day (QID) | INTRAMUSCULAR | Status: DC

## 2015-02-14 NOTE — Consult Note (Signed)
Renal Service Consult Note [facility]  Russell [name] 02/14/2015 Russell [name] Requesting Physician: Dr Gonzella Lex  Reason for Consult:  ESRD pt with DM2 and DKA HPI: The patient is a 45 y.o. year-old with hx of DM2, HTN, ESRD on HD since Dec 2016, DVT, obesity presented yesterday with 3d hist of nausea/ vomiting and fevers. Also dizziness, high BS'.  Has not missed any HD.  BS was 444 w AG of 20, pt admitted and put on IV insulin drip.  Today feels much better, nausea better. BS's improved. UA negative.     ROS  denies any CP, fevers  no abd pain today  no joint pain  no rash   has HD cath and maturing fistula L forearm  Past Medical History  Past Medical History  Diagnosis Date  . Hypertension   . DVT (deep venous thrombosis), H/o 01/2014-on Xarelto 03/24/2014    LLE  . Secondary DM with DKA-AG=16, BIcarb Nl 03/24/2014  . Age-related macular degeneration, wet, both eyes (HCC)     "I'm getting Alia treatments" (12/30/2014)  . Hypothyroidism   . Asthma   . Pneumonia 11/2013  . Daily headache   . Arthritis     "hands" (12/30/2014)  . Anxiety   . Depression   . Type 2 diabetes mellitus with diabetic nephropathy (HCC)   . Nephrotic syndrome 05/18/2014  . CKD stage 3 due to type 2 diabetes mellitus [facility])    Past Surgical History  Past Surgical History  Procedure Laterality Date  . Ankle fracture surgery Right 1988  . Eye surgery    . Esophagogastroduodenoscopy N/A 03/29/2014    Procedure: ESOPHAGOGASTRODUODENOSCOPY (EGD);  Surgeon: Dorena Cookey, MD;  Location: [facility];  Service: Endoscopy;  Laterality: N/A;  . Tonsillectomy and adenoidectomy  1970s  . Fracture surgery    . Av fistula placement Left 01/01/2015    Procedure: CREATION OF LEFT RADIAL CEPHALIC ARTERIOVENOUS (AV) FISTULA ;  Surgeon: Pryor Ochoa, MD;  [facility] OR;  Service: Vascular;  Laterality: Left;  . Insertion of dialysis catheter Right 01/05/2015    Procedure: INSERTION OF RIGHT  INTERNAL JUGULAR DIALYSIS CATHETER;  Surgeon: Fransisco Hertz, MD;  Location: [facility] OR;  Service: Vascular;  Laterality: Right;   Family History  Family History  Problem Relation Age of Onset  . Obesity Mother     Patient states that family members have no other medical illnesses other than what I have described  . Heart disease      No family history  . Cancer Father 50    AML  . Kidney cancer Maternal Grandmother    Social History  reports that he has never smoked. He has never used smokeless tobacco. He reports that he does not drink alcohol or use illicit drugs. Allergies  Allergies  Allergen Reactions  . Ibuprofen Other (See Comments)    MD told him not to take due to kidney disease.  . Nsaids Other (See Comments)    Told to avoid all nsaids due to kidney disease-takes acetaminophen as needed   Home medications Prior to Admission medications   Medication Sig Start Date End Date Taking? Authorizing Provider  acetaminophen (TYLENOL) 500 MG tablet Take 1 tablet (500 mg total) by mouth every 6 (six) hours as needed for moderate pain. 09/29/14  Yes Albertine Grates, MD  albuterol (PROVENTIL HFA;VENTOLIN HFA) 108 (90 BASE) MCG/ACT inhaler Inhale 1 puff into the lungs every 6 (six) hours as needed for wheezing or shortness of  breath.   Yes Historical Provider, MD  albuterol (PROVENTIL) (2.5 MG/3ML) 0.083% nebulizer solution Take 3 mLs (2.5 mg total) by nebulization every 6 (six) hours as needed for wheezing or shortness of breath. 09/03/14  Yes [name], MD  albuterol-ipratropium (COMBIVENT) 18-103 MCG/ACT inhaler Inhale 2 puffs into the lungs every 4 (four) hours. Patient taking differently: Inhale 2 puffs into the lungs every 6 (six) hours as needed for wheezing or shortness of breath. Take as needed 09/03/14  Yes [name], MD  ALPRAZolam Prudy Feeler) 0.5 MG tablet TAKE 1 TABLET TWICE DAILY AS NEEDED FOR SLEEP Patient taking differently: Take 0.5 mg by mouth at bedtime as needed for sleep.   10/12/14  Yes [name], MD  amLODipine (NORVASC) 10 MG tablet Take 1 tablet (10 mg total) by mouth daily. 11/21/14  Yes Campbell Stall, MD  calcium acetate (PHOSLO) 667 MG capsule Take 2 capsules (1,334 mg total) by mouth 3 (three) times daily with meals. 01/08/15  Yes Rolly Salter, MD  citalopram (CELEXA) 40 MG tablet Take 60 mg by mouth at bedtime.  12/21/14  Yes Historical Provider, MD  clonazePAM (KLONOPIN) 1 MG tablet Take 2 mg by mouth at bedtime.    Yes Historical Provider, MD  cloNIDine (CATAPRES) 0.1 MG tablet Take 1 tablet (0.1 mg total) by mouth 2 (two) times daily. Patient taking differently: Take 0.1 mg by mouth daily.  01/08/15  Yes Rolly Salter, MD  cyclobenzaprine (FLEXERIL) 10 MG tablet Take 1 tablet (10 mg total) by mouth at bedtime. Patient taking differently: Take 10 mg by mouth at bedtime as needed for muscle spasms.  10/12/14  Yes [name], MD  gabapentin (NEURONTIN) 100 MG capsule Take 1 capsule (100 mg total) by mouth at bedtime. 09/29/14  Yes Albertine Grates, MD  HYDROmorphone (DILAUDID) 2 MG tablet Take 1 tablet (2 mg total) by mouth every 6 (six) hours as needed for severe pain. 11/24/14  Yes [name], MD  Insulin Lispro (HUMALOG KWIKPEN) 200 UNIT/ML SOPN See admin instructions. 1 unit per 5 grams of carbohydrates with meals/snacks plus coverage scale. Total 50 units/day 12/16/13  Yes Historical Provider, MD  LANTUS SOLOSTAR 100 UNIT/ML Solostar Pen Inject 40 Units into the skin daily. Patient taking differently: Inject 30 Units into the skin daily.  01/08/15  Yes Rolly Salter, MD  levothyroxine (SYNTHROID, LEVOTHROID) 50 MCG tablet Take 1 tablet (50 mcg total) by mouth daily. 05/02/13  Yes [name], MD  metoprolol succinate (TOPROL-XL) 100 MG 24 hr tablet Take 1 tablet (100 mg total) by mouth daily. 05/20/14  Yes Lewayne Bunting, MD  mycophenolate (CELLCEPT) 500 MG tablet Take 500 mg by mouth daily. 12/28/14  Yes Historical Provider, MD  promethazine  (PHENERGAN) 25 MG tablet Take 1 tablet (25 mg total) by mouth every 6 (six) hours as needed for nausea. 11/21/14  Yes Campbell Stall, MD  sucralfate (CARAFATE) 1 GM/10ML suspension Take 1 g by mouth daily as needed (for throat irritation).    Yes Historical Provider, MD  tobramycin (TOBREX) 0.3 % ophthalmic solution Place 1 drop into both eyes every 4 (four) hours. Patient taking differently: Place 1 drop into both eyes daily as needed (for eye injections).  11/24/14  Yes [name], MD   Liver Function Tests  Recent Labs Lab 02/13/15 1556  AST 21  ALT 15*  ALKPHOS 83  BILITOT 0.8  PROT 7.4  ALBUMIN 3.7    Recent Labs Lab 02/13/15 1556  LIPASE 23  CBC  Recent Labs Lab 02/13/15 1556  WBC 11.7*  HGB 11.0*  HCT 32.8*  MCV 82.4  PLT 341   Basic Metabolic Panel  Recent Labs Lab 02/13/15 1556 02/14/15 0051 02/14/15 0448 02/14/15 0721  NA 136 144 143 147*  K 3.9 3.5 3.3* 3.8  CL 95* 106 106 106  CO2 21* 23 23 25   GLUCOSE 444* 210* 283* 172*  BUN 40* 43* 41* 44*  CREATININE 5.25* 5.00* 4.86* 5.07*  CALCIUM 9.8 9.7 9.5 9.9    Filed Vitals:   02/14/15 0435 02/14/15 0732 02/14/15 0837 02/14/15 1110  BP: 180/97 163/107  170/96  Pulse:  107  112  Temp:   97.6 F (36.4 C)   TempSrc:      Resp:  18  23  Height:      Weight:      SpO2:  95%  99%   Exam Alert obese pleasant WM no distress No rash, cyanosis or gangrene Sclera anicteric, throat clear No jvd Chest clear bilat RRR no mrg ABd obese soft ntnd +bs no ascites GU normal male  MS no joint chgs Ext trace LE edema bilat Neuro nf, Ox3     Dialysis: MWF [place]   4h  113kg  2/2.5 bath  Heparin 4000   R IJ cath (maturing LFA AVF_ BP 150/80, good compliance > norvasc 10/ clon 0.1x 2/ MTP 100x 1 11/ 29%/ 209 > no esa or Fe (last esa 1/30, last fe 2/1) 9.3/ 6.5/ 358 > phoslo 3ac/ calcitriol 0.25 ug tiw  Assessment: 1 DKA - improving 2 Nausea/ emesis - improving 3 ESRD HD mwf 4 Anemia  stable Hb 5 MBD cont meds as above 6 Hx DVT 7 HTN on 3 bp meds, stable 8 VOlume is at dry wt this am  Plan - HD Monday, keep even. DC IVF's now.   [name] MD [employer] pager [phone]    cell [phone] 02/14/2015, 12:05 PM

## 2015-02-14 NOTE — Plan of Care (Signed)
Problem: Safety: Goal: Ability to remain free from injury will improve Outcome: Completed/Met Date Met:  02/14/15 Reviewed falls and importance of using call light when needing to use the restroom or ambulate, pt verbalizes understanding

## 2015-02-14 NOTE — Progress Notes (Signed)
[facility] PROGRESS NOTE  Russell [name] XBJ:[id number] DOB: 17-Jun-1970 DOA: 02/13/2015 PCP: [name], MD  Brief narrative 45 year old male with history of diabetes mellitus on insulin, ESRD on hemodialysis Monday, Wednesdays and Fridays (follows with Dr. [name]) history of DVT, hypothyroidism who presented to the ED with persistent nausea and vomiting of one day duration. Patient had blood glucose of 444 with an anion gap of 20. Patient admitted for DKA with ongoing nausea and vomiting.   Assessment/Plan: DKA with ongoing nausea and vomiting. Continue step down monitoring. Continue insulin drip. FSG improved but still has anion gap of 18. Added D5. -Concern for diabetic gastroparesis however had a normal gastric emptying study back in April 2016. -Placed on scheduled IV Reglan. Diet as tolerated. Last A1c of 8.5.  End-stage renal disease On dialysis M, W, F. Last dialysis was on 2/10. Renal consult following. Will have dialysis tomorrow.  Chronic pain On Dilaudid at home which I have resumed. Patient continue abdominal pain with nausea and vomiting.  Essential hypertension Continue amlodipine, metoprolol and clonidine.  Hypothyroidism Continue Synthroid  Diabetic neuropathy Continue Neurontin  Anxiety Continue Xanax.   DVT prophylaxis: Subcutaneous heparin  Diet: Clear Liquid   Code Status: Full code Family Communication: None at bedside Disposition Plan: Continue step down monitoring   Consultants:  Renal  Procedures:  None  Antibiotics:  None  HPI/Subjective: Seen and examined. Complains of nausea and vomiting. Also has abdominal pain yes  Objective: Filed Vitals:   02/14/15 0732 02/14/15 0837  BP: 163/107   Pulse: 107   Temp:  97.6 F (36.4 C)  Resp: 18     Intake/Output Summary (Last 24 hours) at 02/14/15 1003 Last data filed at 02/14/15 0853  Gross per 24 hour  Intake 2359.4 ml  Output    650 ml  Net 1709.4 ml    Filed Weights   02/13/15 1531 02/14/15 0020  Weight: 113.399 kg (250 lb) 113.172 kg (249 lb 8 oz)    Exam:   General:  Middle aged male in some distress with pain and discomfort  HEENT: No pallor, dry mucosa  Chest: Clear bilaterally  CVS: S1 and S2 tachycardic, no murmurs rub or gallop  GI: Soft, mild epigastric tenderness, nondistended, bowel sounds present  Musculoskeletal: Warm, no edema  Data Reviewed: Basic Metabolic Panel:  Recent Labs Lab 02/13/15 1556 02/14/15 0051 02/14/15 0448 02/14/15 0721  NA 136 144 143 147*  K 3.9 3.5 3.3* 3.8  CL 95* 106 106 106  CO2 21* GLUCOSE 444* 210* 283* 172*  BUN 40* 43* 41* 44*  CREATININE 5.25* 5.00* 4.86* 5.07*  CALCIUM 9.8 9.7 9.5 9.9   Liver Function Tests:  Recent Labs Lab 02/13/15 1556  AST 21  ALT 15*  ALKPHOS 83  BILITOT 0.8  PROT 7.4  ALBUMIN 3.7    Recent Labs Lab 02/13/15 1556  LIPASE 23   No results for input(s): AMMONIA in the last 168 hours. CBC:  Recent Labs Lab 02/13/15 1556  WBC 11.7*  HGB 11.0*  HCT 32.8*  MCV 82.4  PLT 341   Cardiac Enzymes: No results for input(s): CKTOTAL, CKMB, CKMBINDEX, TROPONINI in the last 168 hours. BNP (last 3 results) No results for input(s): BNP in the last 8760 hours.  ProBNP (last 3 results) No results for input(s): PROBNP in the last 8760 hours.  CBG:  Recent Labs Lab 02/14/15 0221 02/14/15 0322 02/14/15 0428 02/14/15 0534 02/14/15 0642  GLUCAP 159* 140* 118* 145* 151*  Recent Results (from the past 240 hour(s))  MRSA PCR Screening     Status: None   Collection Time: 02/14/15  1:01 AM  Result Value Ref Range Status   MRSA by PCR NEGATIVE NEGATIVE Final    Comment:        The GeneXpert MRSA Assay (FDA approved for NASAL specimens only), is one component of a comprehensive MRSA colonization surveillance program. It is not intended to diagnose MRSA infection nor to guide or monitor treatment for MRSA infections.       Studies: Ct Abdomen Pelvis Wo Contrast  02/13/2015  CLINICAL DATA:  Flank pain radiating to upper abdomen, vomiting, dizziness and fever onset yesterday. Weakness after dialysis yesterday. EXAM: CT ABDOMEN AND PELVIS WITHOUT CONTRAST TECHNIQUE: Multidetector CT imaging of the abdomen and pelvis was performed following the standard protocol without IV contrast. COMPARISON:  CT abdomen dated 09/30/2014. FINDINGS: Lower chest: No acute findings. Small hiatal hernia. Lung bases are clear. Hepatobiliary: No mass visualized on this un-enhanced exam. Pancreas: Partially infiltrated with fat but otherwise unremarkable. Spleen: Within normal limits in size. Adrenals/Urinary Tract: Adrenal glands appear normal. 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 No renal stone or hydronephrosis bilaterally. No ureteral or bladder calculi identified. Bladder appears normal. Stomach/Bowel: Bowel is normal in caliber. No bowel wall thickening or evidence of bowel wall inflammation. Appendix is normal. Vascular/Lymphatic: No pathologically enlarged lymph nodes. No evidence of abdominal aortic aneurysm. Reproductive: No mass or other significant abnormality. Other: No free fluid or abscess collection. No free intraperitoneal air. Musculoskeletal: Mild degenerative spurring within the lumbar spine. Mild disc bulges at multiple levels without significant central canal stenosis. No acute osseous abnormality. Superficial soft tissues are unremarkable. IMPRESSION: 1. No acute findings. 2. Small hiatal hernia. 3. Horseshoe kidneys. No acute renal abnormality. No renal or ureteral calculi. 4. Mild degenerative change in the lumbar spine. Electronically Signed   By: Bary Richard M.D.   On: 02/13/2015 17:55    Scheduled Meds: . amLODipine  10 mg Oral Daily  . calcium acetate  1,334 mg Oral TID WC  . citalopram  60 mg Oral QHS  . clonazePAM  2 mg Oral QHS  . cloNIDine  0.1 mg Oral Daily  . gabapentin  100 mg Oral QHS  . heparin   5,000 Units Subcutaneous 3 times per day  . levothyroxine  50 mcg Oral QAC breakfast  . metoCLOPramide (REGLAN) injection  10 mg Intravenous 4 times per day  . metoprolol succinate  100 mg Oral Daily   Continuous Infusions: . sodium chloride 125 mL/hr at 02/13/15 1916  . dextrose    . insulin (NOVOLIN-R) infusion 3.5 mL/hr at 02/14/15 0853       Time spent: 25 minutes    Annmarie Plemmons  [facility] Pager [phone]. If 7PM-7AM, please contact night-coverage at [email or web address], password [email or web address] 02/14/2015, 10:03 AM  LOS: 1 day

## 2015-02-14 NOTE — Progress Notes (Signed)
Pt follows with Dr [facility]. Spoke with family medicine resident and care will be assumed by family practice residency team on 02/15/2015.

## 2015-02-14 NOTE — Progress Notes (Signed)
Utilization review completed.  

## 2015-02-15 LAB — BASIC METABOLIC PANEL
ANION GAP: 11 (ref 5–15)
Anion gap: 12 (ref 5–15)
BUN: 48 mg/dL — ABNORMAL HIGH (ref 6–20)
BUN: 16 mg/dL (ref 6–20)
CHLORIDE: 107 mmol/L (ref 101–111)
CHLORIDE: 99 mmol/L — AB (ref 101–111)
CO2: 24 mmol/L (ref 22–32)
CO2: 27 mmol/L (ref 22–32)
CREATININE: 5.44 mg/dL — AB (ref 0.61–1.24)
Calcium: 9.2 mg/dL (ref 8.9–10.3)
Calcium: 8.2 mg/dL — ABNORMAL LOW (ref 8.9–10.3)
Creatinine, Ser: 2.64 mg/dL — ABNORMAL HIGH (ref 0.61–1.24)
GFR calc non Af Amer: 12 mL/min — ABNORMAL LOW (ref >60)
GFR, EST AFRICAN AMERICAN: 13 mL/min — AB (ref >60)
GFR, EST AFRICAN AMERICAN: 32 mL/min — AB (ref >60)
GFR, EST NON AFRICAN AMERICAN: 28 mL/min — AB (ref >60)
Glucose, Bld: 165 mg/dL — ABNORMAL HIGH (ref 65–99)
Glucose, Bld: 101 mg/dL — ABNORMAL HIGH (ref 65–99)
POTASSIUM: 3.3 mmol/L — AB (ref 3.5–5.1)
Potassium: 3.6 mmol/L (ref 3.5–5.1)
SODIUM: 137 mmol/L (ref 135–145)
Sodium: 143 mmol/L (ref 135–145)

## 2015-02-15 LAB — GLUCOSE, CAPILLARY
GLUCOSE-CAPILLARY: 133 mg/dL — AB (ref 65–99)
GLUCOSE-CAPILLARY: 144 mg/dL — AB (ref 65–99)
GLUCOSE-CAPILLARY: 154 mg/dL — AB (ref 65–99)
GLUCOSE-CAPILLARY: 156 mg/dL — AB (ref 65–99)
GLUCOSE-CAPILLARY: 170 mg/dL — AB (ref 65–99)
GLUCOSE-CAPILLARY: 193 mg/dL — AB (ref 65–99)
GLUCOSE-CAPILLARY: 97 mg/dL (ref 65–99)
Glucose-Capillary: 159 mg/dL — ABNORMAL HIGH (ref 65–99)
Glucose-Capillary: 148 mg/dL — ABNORMAL HIGH (ref 65–99)

## 2015-02-15 MED ORDER — INSULIN ASPART 100 UNIT/ML ~~LOC~~ SOLN: 0.0000 [IU] | Freq: Every day | SUBCUTANEOUS | Status: DC

## 2015-02-15 MED ORDER — INSULIN ASPART 100 UNIT/ML ~~LOC~~ SOLN
0.0000 [IU] | Freq: Three times a day (TID) | SUBCUTANEOUS | Status: DC
  Administered 2015-02-15: 2 [IU] via SUBCUTANEOUS

## 2015-02-15 MED ORDER — HEPARIN SODIUM (PORCINE) 1000 UNIT/ML DIALYSIS: 2000.0000 [IU] | INTRAMUSCULAR | Status: DC | PRN

## 2015-02-15 MED ORDER — SODIUM CHLORIDE 0.9 % IV SOLN: 100.0000 mL | INTRAVENOUS | Status: DC | PRN

## 2015-02-15 MED ORDER — INSULIN GLARGINE 100 UNIT/ML ~~LOC~~ SOLN
30.0000 [IU] | Freq: Every day | SUBCUTANEOUS | Status: DC
  Administered 2015-02-15 (×2): 30 [IU] via SUBCUTANEOUS
  Filled 2015-02-15 (×3): qty 0.3

## 2015-02-15 MED ORDER — LIDOCAINE-PRILOCAINE 2.5-2.5 % EX CREA: 1.0000 "application " | TOPICAL_CREAM | CUTANEOUS | Status: DC | PRN

## 2015-02-15 MED ORDER — ALTEPLASE 2 MG IJ SOLR: 2.0000 mg | Freq: Once | INTRAMUSCULAR | Status: DC | PRN

## 2015-02-15 MED ORDER — LIDOCAINE HCL (PF) 1 % IJ SOLN: 5.0000 mL | INTRAMUSCULAR | Status: DC | PRN

## 2015-02-15 MED ORDER — METOCLOPRAMIDE HCL 5 MG/ML IJ SOLN: 5.0000 mg | Freq: Four times a day (QID) | INTRAMUSCULAR | Status: DC | PRN

## 2015-02-15 MED ORDER — HEPARIN SODIUM (PORCINE) 1000 UNIT/ML DIALYSIS: 1000.0000 [IU] | INTRAMUSCULAR | Status: DC | PRN

## 2015-02-15 MED ORDER — PENTAFLUOROPROP-TETRAFLUOROETH EX AERO: 1.0000 "application " | INHALATION_SPRAY | CUTANEOUS | Status: DC | PRN

## 2015-02-15 NOTE — Progress Notes (Signed)
Pt taken to hemodialysis and unavailable for assessment, med administration, or CBG monitoring.

## 2015-02-15 NOTE — Progress Notes (Signed)
Family Medicine Teaching Service Daily Progress Note Intern Pager: [phone]  Patient name: Russell [name] Medical record number: [id number] Date of birth: 04-23-1970 Age: 45 y.o. Gender: male  Primary Care Provider: [name], MD Consultants: Nephro Code Status: Full  Pt Overview and Major Events to Date:  2/13: Admit to inpatient FPTS  Assessment and Plan: Russell [name] is a 45 y.o. male with h/o DM, ESRD with dialysis M/W/F, last dialysis on Friday.  DKA: Glucose 444 on admission.Transitioned to sub q insulin overnight. Anion gap closed x 2. AG of 12 at midnight. Glucose has ranged in mid 100's over last 24 hours.  - Transition to clear liquid diet - Lantus 30 units - SSI moderate with meals and night time coverage  Nausea and Vomiting: Resolved - Reglan 5 mg q 6  - Zofran q6 PRN  HTN: Uncontrolled currently. Last BP 145/92 - Resume home medications; Amlodipine 10 mg daily, Clonidine 0.1mg  daily, and Metoprolol 100 mg daily  ESRD: Cr on admission on 5.44. Dialysis MWF - Phoslo TID - Nephrology consulted appreciate their assistance - HD today  Hypothyroidism - Resume home Synthroid 50 mcg  Depression and Anxiety - Continue home Xanax 0.5 mg daily at bedtime - Klonipin 2 mg daily  FEN/GI: Carb modified/renal diet, KVO PPx: Heparin  Disposition: Transfer out of SDU to med surg  Subjective:  - No acute events overnight - No complaints this morning - Denies nausea, vomiting, diarrhea or constipation - Is feeling hungry  Objective: Temp:  [97.6 F (36.4 C)-98.3 F (36.8 C)] 98.3 F (36.8 C) (02/13 0333) Pulse Rate:  [80-112] 80 (02/12 2000) Resp:  [18-23] 20 (02/12 2000) BP: (123-176)/(74-107) 176/105 mmHg (02/12 2211) SpO2:  [92 %-99 %] 92 % (02/12 2000) Physical Exam: General: In NAD, laying down in HD session Cardiovascular: RRR, normal s1 and s2, no murmurs Respiratory: normal work of breathing, clear to auscultation bilaterally. 2+ dorsalis  pedis pulses Abdomen: soft, non distended, non tender, normal bowel sounds   Extremities: no edema  Laboratory:  Recent Labs Lab 02/13/15 1556  WBC 11.7*  HGB 11.0*  HCT 32.8*  PLT 341    Recent Labs Lab 02/13/15 1556  02/14/15 1135 02/14/15 1617 02/15/15 0026  NA 136  < > 146* 143 143  K 3.9  < > 4.0 3.8 3.6  CL 95*  < > 106 105 107  CO2 21*  < > BUN 40*  < > 48* 47* 48*  CREATININE 5.25*  < > 5.09* 5.21* 5.44*  CALCIUM 9.8  < > 9.8 9.6 9.2  PROT 7.4  --   --   --   --   BILITOT 0.8  --   --   --   --   ALKPHOS 83  --   --   --   --   ALT 15*  --   --   --   --   AST 21  --   --   --   --   GLUCOSE 444*  < > 150* 159* 165*  < > = values in this interval not displayed.   Imaging/Diagnostic Tests: No results found.   Beaulah Dinning, MD 02/15/2015, 7:16 AM PGY-1, [place] [facility] FPTS Intern pager: [phone], text pages welcome

## 2015-02-15 NOTE — Progress Notes (Signed)
Report called to Magda Paganini, RN for [facility], which is where patient will be transferred to.

## 2015-02-15 NOTE — Progress Notes (Signed)
Left radial graft positive bruit, positive thrill.

## 2015-02-15 NOTE — Progress Notes (Signed)
Left radial graft positive bruit, positive thrill. 

## 2015-02-15 NOTE — Procedures (Signed)
I have personally attended this patient's dialysis session. 4K bath, keeping volume even Right sided TDC good flows No changes made in initial orders    Camille Bal, MD [facility] [phone] Pager 02/15/2015, 7:38 AM

## 2015-02-15 NOTE — Progress Notes (Signed)
[facility] Rounding Note Subjective: Pt seen in HD Initiating treatment Feeling much better  Objective Vital signs in last 24 hours: Filed Vitals:   02/14/15 2000 02/14/15 2211 02/14/15 2335 02/15/15 0333  BP: 123/74 176/105    Pulse: 80     Temp: 98.1 F (36.7 C)  97.9 F (36.6 C) 98.3 F (36.8 C)  TempSrc: Oral  Oral Oral  Resp: 20     Height:      Weight:      SpO2: 92%      Weight change:   Intake/Output Summary (Last 24 hours) at 02/15/15 0717 Last data filed at 02/15/15 0015  Gross per 24 hour  Intake 1141.75 ml  Output      0 ml  Net 1141.75 ml   Physical Exam:  BP 176/105 mmHg  Pulse 80  Temp(Src) 98.3 F (36.8 C) (Oral)  Resp 20  Ht  (1.88 m)  Wt 113.172 kg (249 lb 8 oz)  BMI 32.02 kg/m2  SpO2 92% Alert obese pleasant WM no distress Seen in the dialysis unit R sided TDC (just replaced last wk 2/2 poor catheter fx at [facility]) Clean and dry Sclera anicteric No jvd supine Chest clear bilat RRR no mrg ABd obese soft ntnd +bs no ascites Ext trace LE edema bilat Neuro Ox3  L RC AVF good bruit and thrill (01/05/15)   Labs: Basic Metabolic Panel:  Recent Labs Lab 02/13/15 1556 02/14/15 0051 02/14/15 0448 02/14/15 0721 02/14/15 1135 02/14/15 1617 02/15/15 0026  NA 136 144 143 147* 146* 143 143  K 3.9 3.5 3.3* 3.8 4.0 3.8 3.6  CL 95* 106 106 106 106 105 107  CO2 21* GLUCOSE 444* 210* 283* 172* 150* 159* 165*  BUN 40* 43* 41* 44* 48* 47* 48*  CREATININE 5.25* 5.00* 4.86* 5.07* 5.09* 5.21* 5.44*  CALCIUM 9.8 9.7 9.5 9.9 9.8 9.6 9.2     Recent Labs Lab 02/13/15 1556  AST 21  ALT 15*  ALKPHOS 83  BILITOT 0.8  PROT 7.4  ALBUMIN 3.7    Recent Labs Lab 02/13/15 1556  LIPASE 23     Recent Labs Lab 02/13/15 1556  WBC 11.7*  HGB 11.0*  HCT 32.8*  MCV 82.4  PLT 341   :  Recent Labs Lab 02/15/15 0014 02/15/15 0118 02/15/15 0218 02/15/15 0308 02/15/15 0408  GLUCAP 144* 148* 154* 170* 156*     Studies/Results: Ct Abdomen Pelvis Wo Contrast  02/13/2015  CLINICAL DATA:  Flank pain radiating to upper abdomen, vomiting, dizziness and fever onset yesterday. Weakness after dialysis yesterday. EXAM: CT ABDOMEN AND PELVIS WITHOUT CONTRAST TECHNIQUE: Multidetector CT imaging of the abdomen and pelvis was performed following the standard protocol without IV contrast. COMPARISON:  CT abdomen dated 09/30/2014. FINDINGS: Lower chest: No acute findings. Small hiatal hernia. Lung bases are clear. Hepatobiliary: No mass visualized on this un-enhanced exam. Pancreas: Partially infiltrated with fat but otherwise unremarkable. Spleen: Within normal limits in size. Adrenals/Urinary Tract: Adrenal glands appear normal. 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 No renal stone or hydronephrosis bilaterally. No ureteral or bladder calculi identified. Bladder appears normal. Stomach/Bowel: Bowel is normal in caliber. No bowel wall thickening or evidence of bowel wall inflammation. Appendix is normal. Vascular/Lymphatic: No pathologically enlarged lymph nodes. No evidence of abdominal aortic aneurysm. Reproductive: No mass or other significant abnormality. Other: No free fluid or abscess collection. No free intraperitoneal air. Musculoskeletal: Mild degenerative spurring within the lumbar spine. Mild disc  bulges at multiple levels without significant central canal stenosis. No acute osseous abnormality. Superficial soft tissues are unremarkable. IMPRESSION: 1. No acute findings. 2. Small hiatal hernia. 3. Horseshoe kidneys. No acute renal abnormality. No renal or ureteral calculi. 4. Mild degenerative change in the lumbar spine. Electronically Signed   By: Bary Richard M.D.   On: 02/13/2015 17:55   Medications: . dextrose 50 mL/hr at 02/14/15 1621  . insulin (NOVOLIN-R) infusion 0.9 Units/hr (02/15/15 0127)   . amLODipine  10 mg Oral Daily  . calcium acetate  1,334 mg Oral TID WC  . citalopram  60 mg Oral QHS  .  clonazePAM  2 mg Oral QHS  . cloNIDine  0.1 mg Oral BID  . gabapentin  100 mg Oral QHS  . heparin  5,000 Units Subcutaneous 3 times per day  . insulin aspart  0-15 Units Subcutaneous TID WC  . insulin aspart  0-5 Units Subcutaneous QHS  . insulin glargine  30 Units Subcutaneous QHS  . levothyroxine  50 mcg Oral QAC breakfast  . metoCLOPramide (REGLAN) injection  5 mg Intravenous 4 times per day  . metoprolol succinate  100 mg Oral Daily   Dialysis: MWF [place] 4h 113kg 2/2.5 bath Heparin 4000 R IJ cath (maturing LFA AVF 01/19/15) BP 150/80, good compliance > norvasc 10/ clon 0.1x 2/ MTP 100x 1 11/ 29%/ 209 > no esa or Fe (last esa 1/30, last fe 2/1) 9.3/ 6.5/ 358 > phoslo 3ac/ calcitriol 0.25 ug tiw  Background: 45 you ESRD d/t DM, MWF HD at [facility], presented with hyperglycemia, N/V after "feeling bad, not taking hs insulin PTA, sx felt d/t DKA  Assessment: 1 DKA - improving. Insulin per primary service. Initial gap 20 - closed. 2 Nausea/ emesis - resolved. CTA negative 3 ESRD HD mwf - HD today. 4K bath . Keep volume even today.  4 Anemia stable Hb. Last outpt ESA 01/05/15) 5 MBD cont meds as above 6 Hx DVT 7 HTN on 3 bp meds, stable 8 Volume is at EDW.   Camille Bal, MD [facility] [phone] pager 02/15/2015, 7:17 AM

## 2015-02-15 NOTE — Progress Notes (Signed)
Inpatient Diabetes Program Recommendations  AACE/ADA: New Consensus Statement on Inpatient Glycemic Control (2015)  Target Ranges:  Prepandial:   less than 140 mg/dL      Peak postprandial:   less than 180 mg/dL (1-2 hours)      Critically ill patients:  140 - 180 mg/dL   Admit with: DKA  History: DM, ESRD  Home DM Meds: Lantus 30 units daily        Humalog U-200 insulin- 1 unit for every 5 grams of Carbohydrates at meal times (1 unit for every 4 grams carbohydrates at lunch only)       Humalog U-200 insulin- 1 unit for every 20 mg/dl above target CBG of 409 mg/dl  Current Insulin Orders: Lantus 30 units QHS      Novolog Moderate SSI (0-15 units) TID AC + HS       -Note patient was given 30 units Lantus at 3am to transition off IV insulin drip.  To start Novolog Moderate SSI at 8am today.  -Per Chart Review, note patient saw Dr. Carlus Pavlov with [facility] on 10/29/14.  At that visit, slight adjustments were made to patient's Humalog regimen for home.    MD- Once patient starts eating, patient will likely need Novolog Carbohydrate coverage to cover his food intake.  Doses rapid-acting insulin on 1:5 ratio at home- 1 unit Humalog for every 5 grams of carbohydrates consumed      --Will follow patient during hospitalization--  Ambrose Finland RN, MSN, CDE Diabetes Coordinator Inpatient Glycemic Control Team Team Pager: [phone] (8a-5p)

## 2015-02-16 ENCOUNTER — Encounter: Payer: Managed Care, Other (non HMO) | Admitting: Vascular Surgery

## 2015-02-16 LAB — BASIC METABOLIC PANEL
ANION GAP: 12 (ref 5–15)
BUN: 34 mg/dL — ABNORMAL HIGH (ref 6–20)
CALCIUM: 8.7 mg/dL — AB (ref 8.9–10.3)
CO2: 28 mmol/L (ref 22–32)
Chloride: 101 mmol/L (ref 101–111)
Creatinine, Ser: 4.64 mg/dL — ABNORMAL HIGH (ref 0.61–1.24)
GFR, EST AFRICAN AMERICAN: 16 mL/min — AB (ref >60)
GFR, EST NON AFRICAN AMERICAN: 14 mL/min — AB (ref >60)
Glucose, Bld: 96 mg/dL (ref 65–99)
POTASSIUM: 3.5 mmol/L (ref 3.5–5.1)
Sodium: 141 mmol/L (ref 135–145)

## 2015-02-16 LAB — GLUCOSE, CAPILLARY
GLUCOSE-CAPILLARY: 126 mg/dL — AB (ref 65–99)
Glucose-Capillary: 74 mg/dL (ref 65–99)

## 2015-02-16 MED ORDER — METOCLOPRAMIDE HCL 5 MG PO TABS: 5.0000 mg | ORAL_TABLET | Freq: Three times a day (TID) | ORAL | Status: DC | PRN

## 2015-02-16 MED ORDER — LANTUS SOLOSTAR 100 UNIT/ML ~~LOC~~ SOPN: 30.0000 [IU] | PEN_INJECTOR | Freq: Every day | SUBCUTANEOUS | Status: DC

## 2015-02-16 MED ORDER — POTASSIUM CHLORIDE CRYS ER 20 MEQ PO TBCR: 40.0000 meq | EXTENDED_RELEASE_TABLET | Freq: Once | ORAL | Status: DC

## 2015-02-16 NOTE — Progress Notes (Signed)
Family Medicine Teaching Service Daily Progress Note Intern Pager: [phone]  Patient name: Russell [name] Medical record number: [id number] Date of birth: 11/13/1970 Age: 45 y.o. Gender: male  Primary Care Provider: [name], MD Consultants: Nephro Code Status: Full  Pt Overview and Major Events to Date:  2/13: Admit to inpatient FPTS  Assessment and Plan: Russell [name] is a 45 y.o. male with h/o DM, ESRD with dialysis M/W/F, last dialysis on Friday.  DKA: Resolved. Glucose has ranged in mid 100's over last 24 hours although had value of 193 overnight. Lantus 30 units daily, 2 units of Novolog overnight - Carb modified renal diet - Lantus 30 units - SSI moderate with meals and night time coverage -  Likely discharge today  Nausea and Vomiting: Resolved - Will send home with Reglan 5 mg q 6 PRN - Zofran q6 PRN  HTN: Controlled currently. Last BP 110/68 - Resume home medications; Amlodipine 10 mg daily, Clonidine 0.1mg  daily, and Metoprolol 100 mg daily  ESRD: Cr on admission on 5.44>2.64. Dialysis MWF. S/p HD session yesterday - Phoslo TID - Nephrology consulted appreciate their assistance  Hypothyroidism: - Resume home Synthroid 50 mcg  Depression and Anxiety - Continue home Xanax 0.5 mg daily at bedtime - Klonipin 2 mg daily  FEN/GI: Carb modified/renal diet, KVO PPx: Heparin  Disposition: Home today  Subjective:  - No acute events overnight - No complaints this morning - No hypoglycemic episodes - Denies nausea, vomiting, diarrhea or constipation - Is interested in going home with as needed Reglan to help with vomiting  Objective: Temp:  [97.6 F (36.4 C)-98.1 F (36.7 C)] 98.1 F (36.7 C) (02/13 1624) Pulse Rate:  [75-90] 79 (02/13 1624) Resp:  [15-26] 15 (02/13 1624) BP: (110-161)/(68-101) 110/68 mmHg (02/13 1624) SpO2:  [95 %-100 %] 100 % (02/13 1624) Weight:  [250 lb 7.1 oz (113.6 kg)] 250 lb 7.1 oz (113.6 kg) (02/13 1045) Physical  Exam: General: In NAD, laying down in bed Cardiovascular: RRR, normal s1 and s2, no murmurs Respiratory: normal work of breathing, clear to auscultation bilaterally. 2+ dorsalis pedis pulses Abdomen: soft, non distended, non tender, normal bowel sounds   Extremities: no edema  Laboratory:  Recent Labs Lab 02/13/15 1556  WBC 11.7*  HGB 11.0*  HCT 32.8*  PLT 341    Recent Labs Lab 02/13/15 1556  02/15/15 0026 02/15/15 1205 02/16/15 0657  NA 136  < > 143 137 141  K 3.9  < > 3.6 3.3* 3.5  CL 95*  < > 107 99* 101  CO2 21*  < > BUN 40*  < > 48* 16 34*  CREATININE 5.25*  < > 5.44* 2.64* 4.64*  CALCIUM 9.8  < > 9.2 8.2* 8.7*  PROT 7.4  --   --   --   --   BILITOT 0.8  --   --   --   --   ALKPHOS 83  --   --   --   --   ALT 15*  --   --   --   --   AST 21  --   --   --   --   GLUCOSE 444*  < > 165* 101* 96  < > = values in this interval not displayed.   Imaging/Diagnostic Tests: No results found.   Beaulah Dinning, MD 02/16/2015, 6:54 AM PGY-1, [name] FPTS Intern pager: [phone], text pages welcome

## 2015-02-16 NOTE — Progress Notes (Signed)
Subjective: no cos toler. HD yest/ eating brk  No n/v   Objective Vital signs in last 24 hours: Filed Vitals:   02/15/15 1426 02/15/15 1500 02/15/15 1624 02/16/15 0756  BP:   110/68 122/75  Pulse:  80 79 65  Temp: 97.8 F (36.6 C)  98.1 F (36.7 C) 97.7 F (36.5 C)  TempSrc: Oral  Oral Oral  Resp:  Height:      Weight:      SpO2:  99% 100% 98%   Weight change:   Physical Exam: General: alert NAD/ ox3 // appropriate  Heart: RRR No mrg Lungs: CTA  Bilat Abdomen: Bs pos. , obese , soft NT, ND Extremities:no pedal edema  Dialysis Access: R IJ perm cath. pos bruit L FA AVF    OP Dialysis: MWF [place] 4h 113kg 2/2.5 bath Heparin 4000 R IJ cath (maturing LFA AVF_ BP 150/80, good compliance > norvasc 10/ clon 0.1x 2/ MTP 100x 1 11/ 29%/ 209 > no esa or Fe (last esa 1/30, last fe 2/1) 9.3/ 6.5/ 358 > phoslo 3ac/ calcitriol 0.25 ug tiw  Problem/Plan: 1 DKA = resolved  2 Nausea/ emesis = resolvrd  3 ESRD=HD mwf on schedule /he was to see Dr. Hart [place] FU  Apt  New Lfa avf he will reschedule 4 HTN= on 3 bp meds, / fu trend as op may be able to taper down  Am bp 110/68 5VOlume = post hd wt yest  at dry wt 6 Anemia =stable Hb 11.0 7MBD= cont meds  8 Hx DVT 9 disposition = possible dc today  Lenny Pastel, PA-C [facility] [phone] 02/16/2015,8:08 AM  LOS: 3 days    I have seen and examined this patient and agree with plan and assessment above. Pt is OK for discharge from a renal standpoint and can attend his usual HD iin [employer]. Maysa Lynn B,MD 02/16/2015 2:42 PM  Labs: Basic Metabolic Panel:  Recent Labs Lab 02/15/15 0026 02/15/15 1205 02/16/15 0657  NA 143 137 141  K 3.6 3.3* 3.5  CL 107 99* 101  CO2 GLUCOSE 165* 101* 96  BUN 48* 16 34*  CREATININE 5.44* 2.64* PENDING  CALCIUM 9.2 8.2* 8.7*   Liver Function Tests:  Recent Labs Lab 02/13/15 1556  AST 21  ALT 15*  ALKPHOS 83  BILITOT 0.8   PROT 7.4  ALBUMIN 3.7    Recent Labs Lab 02/13/15 1556  LIPASE 23   No results for input(s): AMMONIA in the last 168 hours. CBC:  Recent Labs Lab 02/13/15 1556  WBC 11.7*  HGB 11.0*  HCT 32.8*  MCV 82.4  PLT 341   Cardiac Enzymes: No results for input(s): CKTOTAL, CKMB, CKMBINDEX, TROPONINI in the last 168 hours. CBG:  Recent Labs Lab 02/15/15 0218 02/15/15 0308 02/15/15 0408 02/15/15 1103 02/15/15 2203  GLUCAP 154* 170* 156* 97 193*    Studies/Results: No results found. Medications: . dextrose 50 mL/hr at 02/14/15 1621   . amLODipine  10 mg Oral Daily  . calcium acetate  1,334 mg Oral TID WC  . citalopram  60 mg Oral QHS  . clonazePAM  2 mg Oral QHS  . cloNIDine  0.1 mg Oral BID  . gabapentin  100 mg Oral QHS  . heparin  5,000 Units Subcutaneous 3 times per day  . insulin aspart  0-15 Units Subcutaneous TID WC  . insulin aspart  0-5 Units Subcutaneous QHS  . insulin glargine  30 Units Subcutaneous QHS  . levothyroxine  50 mcg Oral QAC breakfast  . metoprolol succinate  100 mg Oral Daily  . potassium chloride  40 mEq Oral Once

## 2015-02-16 NOTE — Progress Notes (Signed)
02/16/2015 12:29 PM  [name] to be D/C'd Home per MD order.  Discussed prescriptions and follow up appointments with the patient. Prescriptions given to patient, medication list explained in detail. Pt verbalized understanding.    Medication List    STOP taking these medications        ALPRAZolam 0.5 MG tablet  Commonly known as:  XANAX     HUMALOG KWIKPEN 200 UNIT/ML Sopn  Generic drug:  Insulin Lispro      TAKE these medications        acetaminophen 500 MG tablet  Commonly known as:  TYLENOL  Take 1 tablet (500 mg total) by mouth every 6 (six) hours as needed for moderate pain.     albuterol 108 (90 Base) MCG/ACT inhaler  Commonly known as:  PROVENTIL HFA;VENTOLIN HFA  Inhale 1 puff into the lungs every 6 (six) hours as needed for wheezing or shortness of breath.     albuterol-ipratropium 18-103 MCG/ACT inhaler  Commonly known as:  COMBIVENT  Inhale 2 puffs into the lungs every 4 (four) hours.     amLODipine 10 MG tablet  Commonly known as:  NORVASC  Take 1 tablet (10 mg total) by mouth daily.     calcium acetate 667 MG capsule  Commonly known as:  PHOSLO  Take 2 capsules (1,334 mg total) by mouth 3 (three) times daily with meals.     citalopram 40 MG tablet  Commonly known as:  CELEXA  Take 60 mg by mouth at bedtime.     clonazePAM 1 MG tablet  Commonly known as:  KLONOPIN  Take 2 mg by mouth at bedtime.     cloNIDine 0.1 MG tablet  Commonly known as:  CATAPRES  Take 1 tablet (0.1 mg total) by mouth 2 (two) times daily.     cyclobenzaprine 10 MG tablet  Commonly known as:  FLEXERIL  Take 1 tablet (10 mg total) by mouth at bedtime.     gabapentin 100 MG capsule  Commonly known as:  NEURONTIN  Take 1 capsule (100 mg total) by mouth at bedtime.     HYDROmorphone 2 MG tablet  Commonly known as:  DILAUDID  Take 1 tablet (2 mg total) by mouth every 6 (six) hours as needed for severe pain.     LANTUS SOLOSTAR 100 UNIT/ML Solostar Pen  Generic drug:   Insulin Glargine  Inject 30 Units into the skin daily.     levothyroxine 50 MCG tablet  Commonly known as:  SYNTHROID, LEVOTHROID  Take 1 tablet (50 mcg total) by mouth daily.     metoCLOPramide 5 MG tablet  Commonly known as:  REGLAN  Take 1 tablet (5 mg total) by mouth every 8 (eight) hours as needed for nausea.     metoprolol succinate 100 MG 24 hr tablet  Commonly known as:  TOPROL XL  Take 1 tablet (100 mg total) by mouth daily.     mycophenolate 500 MG tablet  Commonly known as:  CELLCEPT  Take 500 mg by mouth daily.     promethazine 25 MG tablet  Commonly known as:  PHENERGAN  Take 1 tablet (25 mg total) by mouth every 6 (six) hours as needed for nausea.     sucralfate 1 GM/10ML suspension  Commonly known as:  CARAFATE  Take 1 g by mouth daily as needed (for throat irritation).     tobramycin 0.3 % ophthalmic solution  Commonly known as:  TOBREX  Place 1 drop into both  eyes every 4 (four) hours.        Filed Vitals:   02/15/15 1624 02/16/15 0756  BP: 110/68 122/75  Pulse: 79 65  Temp: 98.1 F (36.7 C) 97.7 F (36.5 C)  Resp: 15 18    Skin clean, dry and intact without evidence of skin break down, no evidence of skin tears noted. IV catheter discontinued intact. Site without signs and symptoms of complications. Dressing and pressure applied. Pt denies pain at this time. No complaints noted.  An After Visit Summary was printed and given to the patient. Patient escorted via WC, and D/C home via private auto.  [employer], RN-BC, [employer] [facility] Phone [phone]

## 2015-02-16 NOTE — Discharge Instructions (Signed)
You were admitted for diabetic ketoacidosis. We have given you insulin and IV fluids. Additionally, you received hemodialysis while you were here. You are now stable to go home.  Please continue taking your medications as prescribed above. You will need to take 30 units of Lantus daily and follow up with your endocrinologist. For your nausea and vomiting, you will be sent home with a medicine known as Reglan. Please only take Reglan as needed.  Please make an appointment to see your endocrinologist, kidney doctor, and PCP.   Take care!  Diabetic Ketoacidosis Diabetic ketoacidosis is a life-threatening complication of diabetes. If it is not treated, it can cause severe dehydration and organ damage and can lead to a coma or death. CAUSES This condition develops when there is not enough of the hormone insulin in the body. Insulin helps the body to break down sugar for energy. Without insulin, the body cannot break down sugar, so it breaks down fats instead. This leads to the production of acids that are called ketones. Ketones are poisonous at high levels. This condition can be triggered by:  Stress on the body that is brought on by an illness.  Medicines that raise blood glucose levels.  Not taking diabetes medicine. SYMPTOMS Symptoms of this condition include:  Fatigue.  Weight loss.  Excessive thirst.  Light-headedness.  Fruity or sweet-smelling breath.  Excessive urination.  Vision changes.  Confusion or irritability.  Nausea.  Vomiting.  Rapid breathing.  Abdominal pain.  Feeling flushed. DIAGNOSIS This condition is diagnosed based on a medical history, a physical exam, and blood tests. You may also have a urine test that checks for ketones. TREATMENT This condition may be treated with:  Fluid replacement. This may be done to correct dehydration.  Insulin injections. These may be given through the skin or through an IV tube.  Electrolyte replacement.  Electrolytes, such as potassium and sodium, may be given in pill form or through an IV tube.  Antibiotic medicines. These may be prescribed if your condition was caused by an infection. HOME CARE INSTRUCTIONS Eating and Drinking  Drink enough fluids to keep your urine clear or pale yellow.  If you cannot eat, alternate between drinking fluids with sugar (such as juice) and salty fluids (such as broth or bouillon).  If you can eat, follow your usual diet and drink sugar-free liquids, such as water. Other Instructions  Take insulin as directed by your health care provider. Do not skip insulin injections. Do not use expired insulin.  If your blood sugar is over 240 mg/dL, monitor your urine ketones every 4-6 hours.  If you were prescribed an antibiotic medicine, finish all of it even if you start to feel better.  Rest and exercise only as directed by your health care provider.  If you get sick, call your health care provider and begin treatment quickly. Your body often needs extra insulin to fight an illness.  Check your blood glucose levels regularly. If your blood glucose is high, drink plenty of fluids. This helps to flush out ketones. SEEK MEDICAL CARE IF:  Your blood glucose level is too high or too low.  You have ketones in your urine.  You have a fever.  You cannot eat.  You cannot tolerate fluids.  You have been vomiting for more than 2 hours.  You continue to have symptoms of this condition.  You develop new symptoms. SEEK IMMEDIATE MEDICAL CARE IF:  Your blood glucose levels continue to be high (elevated).  Your  monitor reads "high" even when you are taking insulin.  You faint.  You have chest pain.  You have trouble breathing.  You have a sudden, severe headache.  You have sudden weakness in one arm or one leg.  You have sudden trouble speaking or swallowing.  You have vomiting or diarrhea that gets worse after 3 hours.  You feel severely  fatigued.  You have trouble thinking.  You have abdominal pain.  You are severely dehydrated. Symptoms of severe dehydration include:  Extreme thirst.  Dry mouth.  Blue lips.  Cold hands and feet.  Rapid breathing.   This information is not intended to replace advice given to you by your health care provider. Make sure you discuss any questions you have with your health care provider.   Document Released: 12/17/1999 Document Revised: 05/05/2014 Document Reviewed: 11/26/2013 Elsevier Interactive Patient Education [employer]

## 2015-02-17 LAB — GLUCOSE, CAPILLARY
GLUCOSE-CAPILLARY: 160 mg/dL — AB (ref 65–99)
GLUCOSE-CAPILLARY: 136 mg/dL — AB (ref 65–99)
Glucose-Capillary: 152 mg/dL — ABNORMAL HIGH (ref 65–99)
Glucose-Capillary: 147 mg/dL — ABNORMAL HIGH (ref 65–99)
Glucose-Capillary: 146 mg/dL — ABNORMAL HIGH (ref 65–99)
Glucose-Capillary: 139 mg/dL — ABNORMAL HIGH (ref 65–99)

## 2015-02-21 NOTE — Discharge Summary (Signed)
Family Medicine Teaching [facility] Discharge Summary  Patient name: Russell [name] Medical record number: [id number] Date of birth: Apr 30, 1970 Age: 45 y.o. Gender: male Date of Admission: 02/13/2015  Date of Discharge: 02/16/15 Admitting Physician: Hillary Bow, DO  Primary Care Provider: [name], MD Consultants: Nephrology  Indication for Hospitalization: DKA  Discharge Diagnoses/Problem List:  Patient Active Problem List   Diagnosis Date Noted  . DKA (diabetic ketoacidoses) (HCC) 02/13/2015  . ESRD on dialysis (HCC) 02/13/2015  . Nausea and vomiting 02/13/2015  . Nephrotic syndrome 01/03/2015  . Hypertensive urgency 12/30/2014  . Essential hypertension, benign   . Thyroid activity decreased   . Hx of gastroesophageal reflux (GERD) 09/30/2014  . Prolonged Q-T interval on ECG 09/30/2014  . Acute on chronic renal failure (HCC) 09/24/2014  . Diabetic neuropathy (HCC) 09/21/2014  . Uncontrolled diabetes mellitus type 2 with peripheral artery disease (HCC) 09/21/2014  . Morbid obesity (HCC) 09/21/2014  . Superficial thrombophlebitis 03/24/2014  . Hypothyroidism (acquired) 03/24/2014    Disposition: home  Discharge Condition: stable  Discharge Exam: see previous progress note  Brief Hospital Course:  Russell [name] is a 45 y.o. male who presented with DKA with h/o DM, ESRD with dialysis M/W/F.  DKA Presented with BG of 444 on admission with anion gap of 20 and urinalysis remarkable for: >1000 glucose, 15 ketones, >300 protein.Vital signs remained stable (besides some slight tachycardia on admission) and patient remained afebrile. Patient was placed on IVF and insulin gtt regimen per DKA protocol. He was then admitted to step down unit. Once anion gap closed x 2 per BMP, was transitioned to sub-q insulin and given a diet. Pt did not require a significant amount of sliding scale insulin. Insulin was titrated as needed and glucose stabilized to mid 100s range.   On day of discharge, patient was sent home with the following insulin regimen: Lantus 30 units daily.  Nausea and Vomiting:  Resolved with Zofran q6 PRN and Reglan . Was sent home with Reglan 5 mg PRN.   ESRD:  Phoslo TID was continued. Nephrology was consulted. Pt received HD on 2/13.   All other chronic medical conditions stable throughout admission and managed with home regimens.  Issues for Follow Up:  1. Insulin regimen: was sent home with 30 units of Lantus daily. May require adjustment of dose depending upon glucose levels.  2. Nausea and Vomiting: Suspect there is some level of gastroparesis contributing to symptoms. Recommend continuing Reglan but only PRN due to side effects 3. ESRD: Will need to continue HD on MWF 4. Benzodiazepines: Discontinued Xanax at discharge as patient has Klonopin on medication list as well.   Significant Procedures: HD   Significant Labs and Imaging:  No results for input(s): WBC, HGB, HCT, PLT in the last 168 hours.  Recent Labs Lab 02/14/15 1617 02/15/15 0026 02/15/15 1205 02/16/15 0657  NA 143 143 137 141  K 3.8 3.6 3.3* 3.5  CL 105 107 99* 101  CO2 GLUCOSE 159* 165* 101* 96  BUN 47* 48* 16 34*  CREATININE 5.21* 5.44* 2.64* 4.64*  CALCIUM 9.6 9.2 8.2* 8.7*   Ct Abdomen Pelvis Wo Contrast  02/13/2015  CLINICAL DATA:  Flank pain radiating to upper abdomen, vomiting, dizziness and fever onset yesterday. Weakness after dialysis yesterday. EXAM: CT ABDOMEN AND PELVIS WITHOUT CONTRAST TECHNIQUE: Multidetector CT imaging of the abdomen and pelvis was performed following the standard protocol without IV contrast. COMPARISON:  CT abdomen dated 09/30/2014. FINDINGS:  Lower chest: No acute findings. Small hiatal hernia. Lung bases are clear. Hepatobiliary: No mass visualized on this un-enhanced exam. Pancreas: Partially infiltrated with fat but otherwise unremarkable. Spleen: Within normal limits in size. Adrenals/Urinary Tract:  Adrenal glands appear normal. 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 No renal stone or hydronephrosis bilaterally. No ureteral or bladder calculi identified. Bladder appears normal. Stomach/Bowel: Bowel is normal in caliber. No bowel wall thickening or evidence of bowel wall inflammation. Appendix is normal. Vascular/Lymphatic: No pathologically enlarged lymph nodes. No evidence of abdominal aortic aneurysm. Reproductive: No mass or other significant abnormality. Other: No free fluid or abscess collection. No free intraperitoneal air. Musculoskeletal: Mild degenerative spurring within the lumbar spine. Mild disc bulges at multiple levels without significant central canal stenosis. No acute osseous abnormality. Superficial soft tissues are unremarkable. IMPRESSION: 1. No acute findings. 2. Small hiatal hernia. 3. Horseshoe kidneys. No acute renal abnormality. No renal or ureteral calculi. 4. Mild degenerative change in the lumbar spine. Electronically Signed   By: Bary Richard M.D.   On: 02/13/2015 17:55    Results/Tests Pending at Time of Discharge: none  Discharge Medications:    Medication List    STOP taking these medications        ALPRAZolam 0.5 MG tablet  Commonly known as:  XANAX     HUMALOG KWIKPEN 200 UNIT/ML Sopn  Generic drug:  Insulin Lispro      TAKE these medications        acetaminophen 500 MG tablet  Commonly known as:  TYLENOL  Take 1 tablet (500 mg total) by mouth every 6 (six) hours as needed for moderate pain.     albuterol 108 (90 Base) MCG/ACT inhaler  Commonly known as:  PROVENTIL HFA;VENTOLIN HFA  Inhale 1 puff into the lungs every 6 (six) hours as needed for wheezing or shortness of breath.     albuterol-ipratropium 18-103 MCG/ACT inhaler  Commonly known as:  COMBIVENT  Inhale 2 puffs into the lungs every 4 (four) hours.     amLODipine 10 MG tablet  Commonly known as:  NORVASC  Take 1 tablet (10 mg total) by mouth daily.     calcium acetate 667 MG  capsule  Commonly known as:  PHOSLO  Take 2 capsules (1,334 mg total) by mouth 3 (three) times daily with meals.     citalopram 40 MG tablet  Commonly known as:  CELEXA  Take 60 mg by mouth at bedtime.     clonazePAM 1 MG tablet  Commonly known as:  KLONOPIN  Take 2 mg by mouth at bedtime.     cloNIDine 0.1 MG tablet  Commonly known as:  CATAPRES  Take 1 tablet (0.1 mg total) by mouth 2 (two) times daily.     cyclobenzaprine 10 MG tablet  Commonly known as:  FLEXERIL  Take 1 tablet (10 mg total) by mouth at bedtime.     gabapentin 100 MG capsule  Commonly known as:  NEURONTIN  Take 1 capsule (100 mg total) by mouth at bedtime.     HYDROmorphone 2 MG tablet  Commonly known as:  DILAUDID  Take 1 tablet (2 mg total) by mouth every 6 (six) hours as needed for severe pain.     LANTUS SOLOSTAR 100 UNIT/ML Solostar Pen  Generic drug:  Insulin Glargine  Inject 30 Units into the skin daily.     levothyroxine 50 MCG tablet  Commonly known as:  SYNTHROID, LEVOTHROID  Take 1 tablet (50 mcg total) by mouth daily.  metoCLOPramide 5 MG tablet  Commonly known as:  REGLAN  Take 1 tablet (5 mg total) by mouth every 8 (eight) hours as needed for nausea.     metoprolol succinate 100 MG 24 hr tablet  Commonly known as:  TOPROL XL  Take 1 tablet (100 mg total) by mouth daily.     mycophenolate 500 MG tablet  Commonly known as:  CELLCEPT  Take 500 mg by mouth daily.     promethazine 25 MG tablet  Commonly known as:  PHENERGAN  Take 1 tablet (25 mg total) by mouth every 6 (six) hours as needed for nausea.     sucralfate 1 GM/10ML suspension  Commonly known as:  CARAFATE  Take 1 g by mouth daily as needed (for throat irritation).     tobramycin 0.3 % ophthalmic solution  Commonly known as:  TOBREX  Place 1 drop into both eyes every 4 (four) hours.        Discharge Instructions: Please refer to Patient Instructions section of EMR for full details.  Patient was counseled  important signs and symptoms that should prompt return to medical care, changes in medications, dietary instructions, activity restrictions, and follow up appointments.   Follow-Up Appointments:     Follow-up Information    Schedule an appointment as soon as possible for a visit with [name], MD.   Specialty:  Family Medicine   Why:  hospital follow up; APPOINTMENT:  Dr. Milus Glazier does not make appointments, but will be in the office Today and Tommorrow from 2 pm to 6 pm each day, you just need to showup at the office   Contact information:   [address] [place] [address] [phone]       Beaulah Dinning, MD 02/21/2015, 1:51 PM PGY-1, [facility]

## 2015-03-11 ENCOUNTER — Ambulatory Visit (INDEPENDENT_AMBULATORY_CARE_PROVIDER_SITE_OTHER): Payer: Managed Care, Other (non HMO) | Admitting: Family Medicine

## 2015-03-11 ENCOUNTER — Encounter: Payer: Self-pay | Admitting: Family Medicine

## 2015-03-11 VITALS — BP 130/80 | HR 110 | Temp 97.8°F | Resp 16 | Ht 72.0 in | Wt 252.0 lb

## 2015-03-11 DIAGNOSIS — G4459 Other complicated headache syndrome: Secondary | ICD-10-CM | POA: Diagnosis not present

## 2015-03-11 DIAGNOSIS — R110 Nausea: Secondary | ICD-10-CM

## 2015-03-11 DIAGNOSIS — M791 Myalgia, unspecified site: Secondary | ICD-10-CM

## 2015-03-11 DIAGNOSIS — M79605 Pain in left leg: Secondary | ICD-10-CM

## 2015-03-11 DIAGNOSIS — M79604 Pain in right leg: Secondary | ICD-10-CM

## 2015-03-11 DIAGNOSIS — E08311 Diabetes mellitus due to underlying condition with unspecified diabetic retinopathy with macular edema: Secondary | ICD-10-CM

## 2015-03-11 DIAGNOSIS — E1129 Type 2 diabetes mellitus with other diabetic kidney complication: Secondary | ICD-10-CM

## 2015-03-11 DIAGNOSIS — N19 Unspecified kidney failure: Secondary | ICD-10-CM

## 2015-03-11 DIAGNOSIS — J4530 Mild persistent asthma, uncomplicated: Secondary | ICD-10-CM

## 2015-03-11 MED ORDER — LANTUS SOLOSTAR 100 UNIT/ML ~~LOC~~ SOPN: 30.0000 [IU] | PEN_INJECTOR | Freq: Two times a day (BID) | SUBCUTANEOUS | Status: DC

## 2015-03-11 MED ORDER — CYCLOBENZAPRINE HCL 10 MG PO TABS: 10.0000 mg | ORAL_TABLET | Freq: Every day | ORAL | Status: DC

## 2015-03-11 MED ORDER — PROMETHAZINE HCL 25 MG PO TABS: 25.0000 mg | ORAL_TABLET | Freq: Four times a day (QID) | ORAL | Status: DC | PRN

## 2015-03-11 MED ORDER — HYDROMORPHONE HCL 2 MG PO TABS: 2.0000 mg | ORAL_TABLET | Freq: Four times a day (QID) | ORAL | Status: DC | PRN

## 2015-03-11 MED ORDER — ALBUTEROL SULFATE HFA 108 (90 BASE) MCG/ACT IN AERS: 1.0000 | INHALATION_SPRAY | Freq: Four times a day (QID) | RESPIRATORY_TRACT | Status: AC | PRN

## 2015-03-11 NOTE — Progress Notes (Signed)
Subjective:  This chart was scribed for [name] MD, by Veverly Fells, at [facility]  This patient was seen in room 11 and the patient's care was started at 11:41 AM.     Patient ID: Russell [name], male    DOB: Nov 13, 1970, 45 y.o.   MRN: [id number] Chief Complaint  Patient presents with  . Follow-up    kidney disease    HPI  HPI Comments: Russell [name] is a 45 y.o. male who presents to the [facility] with a history of end stage renal failure (is currently on dialysis) and would like paperwork completed as he is no longer able to work.  His nephorlogist is Dr. Lacy Duverney. Patient was admitted into the ED with abdominal pain on 02/13/15 and was told that they thought the had gastroparesis which is causing him to get ill. They put a fistula in his arm (December 29th). Patients next dialysis appointment is tomorrow. He states that he is getting very little exercise and feels very "worn out" after his dialysis sessions.   Patient states that his eyes are doing much worse.  He is still getting treatment for this.  Dr. Allyne Gee is his eye doctor.   Patient states that his feet are still numb.  He is currently compliant with his Gabapentin as well as other medications. He would like a refill today on some of his medications as well.   Diabetes: He states that his sugar is doing well and his numbers range from 130-140.  (Dr. Elvera Lennox follows him for this).      Patient Active Problem List   Diagnosis Date Noted  . DKA (diabetic ketoacidoses) (HCC) 02/13/2015  . ESRD on dialysis (HCC) 02/13/2015  . Nausea and vomiting 02/13/2015  . Nephrotic syndrome 01/03/2015  . Hypertensive urgency 12/30/2014  . Essential hypertension, benign   . Thyroid activity decreased   . Hx of gastroesophageal reflux (GERD) 09/30/2014  . Prolonged Q-T interval on ECG 09/30/2014  . Acute on chronic renal failure (HCC) 09/24/2014  . Diabetic neuropathy (HCC)  09/21/2014  . Uncontrolled diabetes mellitus type 2 with peripheral artery disease (HCC) 09/21/2014  . Morbid obesity (HCC) 09/21/2014  . Superficial thrombophlebitis 03/24/2014  . Hypothyroidism (acquired) 03/24/2014   Past Medical History  Diagnosis Date  . Hypertension   . DVT (deep venous thrombosis), H/o 01/2014-on Xarelto 03/24/2014    LLE  . Secondary DM with DKA-AG=16, BIcarb Nl 03/24/2014  . Age-related macular degeneration, wet, both eyes (HCC)     "I'm getting Alia treatments" (12/30/2014)  . Hypothyroidism   . Asthma   . Pneumonia 11/2013  . Daily headache   . Arthritis     "hands" (12/30/2014)  . Anxiety   . Depression   . Type 2 diabetes mellitus with diabetic nephropathy (HCC)   . Nephrotic syndrome 05/18/2014  . CKD stage 3 due to type 2 diabetes mellitus [facility])    Past Surgical History  Procedure Laterality Date  . Ankle fracture surgery Right 1988  . Eye surgery    . Esophagogastroduodenoscopy N/A 03/29/2014    Procedure: ESOPHAGOGASTRODUODENOSCOPY (EGD);  Surgeon: Dorena Cookey, MD;  Location: [facility];  Service: Endoscopy;  Laterality: N/A;  . Tonsillectomy and adenoidectomy  1970s  . Fracture surgery    . Av fistula placement Left 01/01/2015    Procedure: CREATION OF LEFT RADIAL CEPHALIC ARTERIOVENOUS (AV) FISTULA ;  Surgeon: Pryor Ochoa, MD;  Location: [facility] OR;  Service: Vascular;  Laterality: Left;  . Insertion of dialysis catheter Right 01/05/2015    Procedure: INSERTION OF RIGHT INTERNAL JUGULAR DIALYSIS CATHETER;  Surgeon: Fransisco HertzBrian L Chen, MD;  [facility];  Service: Vascular;  Laterality: Right;   Allergies  Allergen Reactions  . Ibuprofen Other (See Comments)    MD told him not to take due to kidney disease.  . Nsaids Other (See Comments)    Told to avoid all nsaids due to kidney disease-takes acetaminophen as needed   Prior to Admission medications   Medication Sig Start Date End Date Taking? Authorizing Provider  acetaminophen (TYLENOL) 500 MG  tablet Take 1 tablet (500 mg total) by mouth every 6 (six) hours as needed for moderate pain. 09/29/14   Albertine GratesFang Xu, MD  albuterol (PROVENTIL HFA;VENTOLIN HFA) 108 (90 BASE) MCG/ACT inhaler Inhale 1 puff into the lungs every 6 (six) hours as needed for wheezing or shortness of breath.    Historical Provider, MD  albuterol-ipratropium (COMBIVENT) 18-103 MCG/ACT inhaler Inhale 2 puffs into the lungs every 4 (four) hours. Patient taking differently: Inhale 2 puffs into the lungs every 6 (six) hours as needed for wheezing or shortness of breath. Take as needed 09/03/14   Elvina SidleKurt Ionia Schey, MD  amLODipine (NORVASC) 10 MG tablet Take 1 tablet (10 mg total) by mouth daily. 11/21/14   Campbell StallKaty Dodd Mayo, MD  calcium acetate (PHOSLO) 667 MG capsule Take 2 capsules (1,334 mg total) by mouth 3 (three) times daily with meals. 01/08/15   Rolly SalterPranav M Patel, MD  citalopram (CELEXA) 40 MG tablet Take 60 mg by mouth at bedtime.  12/21/14   Historical Provider, MD  clonazePAM (KLONOPIN) 1 MG tablet Take 2 mg by mouth at bedtime.     Historical Provider, MD  cloNIDine (CATAPRES) 0.1 MG tablet Take 1 tablet (0.1 mg total) by mouth 2 (two) times daily. Patient taking differently: Take 0.1 mg by mouth daily.  01/08/15   Rolly SalterPranav M Patel, MD  cyclobenzaprine (FLEXERIL) 10 MG tablet Take 1 tablet (10 mg total) by mouth at bedtime. Patient taking differently: Take 10 mg by mouth at bedtime as needed for muscle spasms.  10/12/14   Elvina SidleKurt Kimbely Whiteaker, MD  gabapentin (NEURONTIN) 100 MG capsule Take 1 capsule (100 mg total) by mouth at bedtime. 09/29/14   Albertine GratesFang Xu, MD  HYDROmorphone (DILAUDID) 2 MG tablet Take 1 tablet (2 mg total) by mouth every 6 (six) hours as needed for severe pain. 11/24/14   Elvina SidleKurt Florentino Laabs, MD  LANTUS SOLOSTAR 100 UNIT/ML Solostar Pen Inject 30 Units into the skin daily. 02/16/15   Beaulah Dinninghristina M Gambino, MD  levothyroxine (SYNTHROID, LEVOTHROID) 50 MCG tablet Take 1 tablet (50 mcg total) by mouth daily. 05/02/13   Elvina SidleKurt Lillionna Nabi, MD    metoCLOPramide (REGLAN) 5 MG tablet Take 1 tablet (5 mg total) by mouth every 8 (eight) hours as needed for nausea. 02/16/15   Beaulah Dinninghristina M Gambino, MD  metoprolol succinate (TOPROL-XL) 100 MG 24 hr tablet Take 1 tablet (100 mg total) by mouth daily. 05/20/14   Lewayne BuntingBrian S Crenshaw, MD  mycophenolate (CELLCEPT) 500 MG tablet Take 500 mg by mouth daily. 12/28/14   Historical Provider, MD  promethazine (PHENERGAN) 25 MG tablet Take 1 tablet (25 mg total) by mouth every 6 (six) hours as needed for nausea. 11/21/14   Campbell StallKaty Dodd Mayo, MD  sucralfate (CARAFATE) 1 GM/10ML suspension Take 1 g by mouth daily as needed (for throat irritation).     Historical Provider, MD  tobramycin (TOBREX) 0.3 % ophthalmic  solution Place 1 drop into both eyes every 4 (four) hours. Patient taking differently: Place 1 drop into both eyes daily as needed (for eye injections).  11/24/14   [name], MD   Social History   Social History  . Marital Status: Single    Spouse Name: N/A  . Number of Children: N/A  . Years of Education: N/A   Occupational History  .      Maintenance Curator   Social History Main Topics  . Smoking status: Never Smoker   . Smokeless tobacco: Never Used  . Alcohol Use: No  . Drug Use: No  . Sexual Activity: Not Currently   Other Topics Concern  . Not on file   Social History Narrative   Patient currently lives with his fiance   Works with facilities   Nonsmoker, nondrinker       Review of Systems  Constitutional: Negative for fever and chills.  Respiratory: Negative for cough, choking and shortness of breath.   Gastrointestinal: Negative for nausea and vomiting.  Neurological: Positive for numbness. Negative for seizures and syncope.      Objective:   Physical Exam  Constitutional: He is oriented to person, place, and time.  HENT:  Head: Normocephalic and atraumatic.  Eyes: Pupils are equal, round, and reactive to light.  Cardiovascular: Normal rate, regular rhythm and  normal heart sounds.  Exam reveals no gallop and no friction rub.   No murmur heard. Pulmonary/Chest: Effort normal and breath sounds normal. No respiratory distress. He has no wheezes. He has no rales.  Tender left areola   Neurological: He is alert and oriented to person, place, and time.  Psychiatric: He has a normal mood and affect. His behavior is normal.  Multiple forms filled out  Filed Vitals:   03/11/15 1122  BP: 130/80  Pulse: 110  Temp: 97.8 F (36.6 C)  TempSrc: Oral  Resp: 16  Height: 6' (1.829 m)  Weight: 252 lb (114.306 kg)  SpO2: 99%       Assessment & Plan:   This chart was scribed in my presence and reviewed by me personally.    ICD-9-CM ICD-10-CM   1. Nausea without vomiting 787.02 R11.0 promethazine (PHENERGAN) 25 MG tablet  2. Other complicated headache syndrome 339.44 G44.59   3. Myalgia 729.1 M79.1 cyclobenzaprine (FLEXERIL) 10 MG tablet  4. Renal failure 586 N19   5. Diabetic retinopathy associated with diabetes mellitus due to underlying condition, with macular edema, with unspecified retinopathy severity 249.50 E08.311 LANTUS SOLOSTAR 100 UNIT/ML Solostar Pen   362.07     362.01    6. Type 2 diabetes mellitus with other diabetic kidney complication (HCC) 250.40 E11.29 LANTUS SOLOSTAR 100 UNIT/ML Solostar Pen  7. Leg pain, bilateral 729.5 M79.604 HYDROmorphone (DILAUDID) 2 MG tablet    M79.605   8. Asthma, mild persistent, uncomplicated 493.90 J45.30 albuterol (PROVENTIL HFA;VENTOLIN HFA) 108 (90 Base) MCG/ACT inhaler     Signed, [name], MD

## 2015-03-22 DIAGNOSIS — Z0271 Encounter for disability determination: Secondary | ICD-10-CM

## 2015-05-22 ENCOUNTER — Ambulatory Visit (INDEPENDENT_AMBULATORY_CARE_PROVIDER_SITE_OTHER): Payer: Managed Care, Other (non HMO) | Admitting: Family Medicine

## 2015-05-22 VITALS — BP 116/78 | HR 86 | Temp 97.8°F | Resp 18 | Ht 72.0 in | Wt 261.0 lb

## 2015-05-22 DIAGNOSIS — E039 Hypothyroidism, unspecified: Secondary | ICD-10-CM

## 2015-05-22 DIAGNOSIS — B351 Tinea unguium: Secondary | ICD-10-CM

## 2015-05-22 DIAGNOSIS — I10 Essential (primary) hypertension: Secondary | ICD-10-CM | POA: Diagnosis not present

## 2015-05-22 DIAGNOSIS — L97519 Non-pressure chronic ulcer of other part of right foot with unspecified severity: Secondary | ICD-10-CM

## 2015-05-22 DIAGNOSIS — R110 Nausea: Secondary | ICD-10-CM

## 2015-05-22 DIAGNOSIS — E08311 Diabetes mellitus due to underlying condition with unspecified diabetic retinopathy with macular edema: Secondary | ICD-10-CM | POA: Diagnosis not present

## 2015-05-22 DIAGNOSIS — E1129 Type 2 diabetes mellitus with other diabetic kidney complication: Secondary | ICD-10-CM

## 2015-05-22 DIAGNOSIS — K3184 Gastroparesis: Secondary | ICD-10-CM

## 2015-05-22 DIAGNOSIS — M791 Myalgia, unspecified site: Secondary | ICD-10-CM

## 2015-05-22 DIAGNOSIS — M79605 Pain in left leg: Secondary | ICD-10-CM

## 2015-05-22 DIAGNOSIS — E11621 Type 2 diabetes mellitus with foot ulcer: Secondary | ICD-10-CM

## 2015-05-22 DIAGNOSIS — M79604 Pain in right leg: Secondary | ICD-10-CM

## 2015-05-22 MED ORDER — TERBINAFINE HCL 250 MG PO TABS: 250.0000 mg | ORAL_TABLET | Freq: Every day | ORAL | Status: DC

## 2015-05-22 MED ORDER — GABAPENTIN 100 MG PO CAPS: 200.0000 mg | ORAL_CAPSULE | Freq: Three times a day (TID) | ORAL | Status: DC

## 2015-05-22 MED ORDER — LANTUS SOLOSTAR 100 UNIT/ML ~~LOC~~ SOPN: 60.0000 [IU] | PEN_INJECTOR | Freq: Two times a day (BID) | SUBCUTANEOUS | Status: DC

## 2015-05-22 MED ORDER — CYCLOBENZAPRINE HCL 10 MG PO TABS: 10.0000 mg | ORAL_TABLET | Freq: Every day | ORAL | Status: DC

## 2015-05-22 MED ORDER — CLONAZEPAM 1 MG PO TABS: 1.0000 mg | ORAL_TABLET | Freq: Two times a day (BID) | ORAL | Status: DC

## 2015-05-22 MED ORDER — METOPROLOL SUCCINATE ER 100 MG PO TB24: 100.0000 mg | ORAL_TABLET | Freq: Every day | ORAL | Status: DC

## 2015-05-22 MED ORDER — LEVOTHYROXINE SODIUM 50 MCG PO TABS: 50.0000 ug | ORAL_TABLET | Freq: Every day | ORAL | Status: DC

## 2015-05-22 MED ORDER — METOCLOPRAMIDE HCL 5 MG PO TABS: 5.0000 mg | ORAL_TABLET | Freq: Three times a day (TID) | ORAL | Status: DC | PRN

## 2015-05-22 MED ORDER — AMLODIPINE BESYLATE 10 MG PO TABS: 10.0000 mg | ORAL_TABLET | Freq: Every day | ORAL | Status: DC

## 2015-05-22 MED ORDER — CITALOPRAM HYDROBROMIDE 40 MG PO TABS: 60.0000 mg | ORAL_TABLET | Freq: Every day | ORAL | Status: DC

## 2015-05-22 MED ORDER — PROMETHAZINE HCL 25 MG PO TABS: 25.0000 mg | ORAL_TABLET | Freq: Three times a day (TID) | ORAL | Status: DC | PRN

## 2015-05-22 MED ORDER — AMOXICILLIN-POT CLAVULANATE 875-125 MG PO TABS: 1.0000 | ORAL_TABLET | Freq: Every day | ORAL | Status: DC

## 2015-05-22 MED ORDER — HYDROMORPHONE HCL 2 MG PO TABS: 2.0000 mg | ORAL_TABLET | Freq: Three times a day (TID) | ORAL | Status: DC | PRN

## 2015-05-22 NOTE — Patient Instructions (Signed)
     IF you received an x-ray today, you will receive an invoice from [facility]. Please contact [facility] at [phone] with questions or concerns regarding your invoice.   IF you received labwork today, you will receive an invoice from [facility]. Please contact [facility] at [phone] with questions or concerns regarding your invoice.   Our billing staff will not be able to assist you with questions regarding bills from these companies.  You will be contacted with the lab results as soon as they are available. The fastest way to get your results is to activate your My Chart account. Instructions are located on the last page of this paperwork. If you have not heard from us regarding the results in 2 weeks, please contact this office.      

## 2015-05-22 NOTE — Progress Notes (Signed)
This is a 45 year old disabled man who is had ongoing nephrotic syndrome, diabetes, subsequent renal failure, and now right diabetic foot ulcer.  This is been one or more difficult patients to manage in that he has had difficult blood sugars control and progressive renal the duration despite the intervention of endocrinologist and nephrologist.  Patient also needs all of his medications refilled. He has no new symptoms other than the diabetic foot ulcer.  He is being dialyzed 3 times a week.  Objective:BP 116/78 mmHg  Pulse 86  Temp(Src) 97.8 F (36.6 C) (Oral)  Resp 18  Ht 6' (1.829 m)  Wt 261 lb (118.389 kg)  BMI 35.39 kg/m2  SpO2 97% Patient's in no acute distress Examination of the right foot reveals a 2 x 4 cm full-thickness ulcer with white eschar on the margins and further out there is erythema. It is tender.  Patient has minimal edema.  I spent 45 minutes face-to-face with [name] and his mother-in-law.  Assessment: We have yet another complication of the diabetes and nephrotic syndrome.  Plan:Diabetic ulcer of right foot associated with type 2 diabetes mellitus (HCC) - Plan: amoxicillin-clavulanate (AUGMENTIN) 875-125 MG tablet, Ambulatory referral to Pain Clinic  Essential hypertension - Plan: metoprolol succinate (TOPROL-XL) 100 MG 24 hr tablet  Diabetic retinopathy associated with diabetes mellitus due to underlying condition, with macular edema, with unspecified retinopathy severity - Plan: LANTUS SOLOSTAR 100 UNIT/ML Solostar Pen  Type 2 diabetes mellitus with other diabetic kidney complication (HCC) - Plan: LANTUS SOLOSTAR 100 UNIT/ML Solostar Pen  Leg pain, bilateral - Plan: HYDROmorphone (DILAUDID) 2 MG tablet  Nausea without vomiting - Plan: promethazine (PHENERGAN) 25 MG tablet  Hypothyroidism, unspecified hypothyroidism type - Plan: levothyroxine (SYNTHROID, LEVOTHROID) 50 MCG tablet  Myalgia - Plan: cyclobenzaprine (FLEXERIL) 10 MG  tablet  Onychomycosis - Plan: terbinafine (LAMISIL) 250 MG tablet  Gastroparesis - Plan: metoCLOPramide (REGLAN) 5 MG tablet  Elvina SidleKurt Jeweline [name] M.D.

## 2015-07-19 ENCOUNTER — Inpatient Hospital Stay
Admission: EM | Admit: 2015-07-19 | Discharge: 2015-07-21 | DRG: 637 | Disposition: A | Discharge status: Home / Self Care | Payer: Managed Care, Other (non HMO) | Attending: Internal Medicine | Admitting: Internal Medicine

## 2015-07-19 ENCOUNTER — Emergency Department: Payer: Managed Care, Other (non HMO)

## 2015-07-19 ENCOUNTER — Encounter: Payer: Self-pay | Admitting: Emergency Medicine

## 2015-07-19 DIAGNOSIS — J45909 Unspecified asthma, uncomplicated: Secondary | ICD-10-CM | POA: Diagnosis present

## 2015-07-19 DIAGNOSIS — K3184 Gastroparesis: Secondary | ICD-10-CM | POA: Diagnosis present

## 2015-07-19 DIAGNOSIS — D631 Anemia in chronic kidney disease: Secondary | ICD-10-CM | POA: Diagnosis present

## 2015-07-19 DIAGNOSIS — Z8051 Family history of malignant neoplasm of kidney: Secondary | ICD-10-CM

## 2015-07-19 DIAGNOSIS — Z9889 Other specified postprocedural states: Secondary | ICD-10-CM

## 2015-07-19 DIAGNOSIS — E1143 Type 2 diabetes mellitus with diabetic autonomic (poly)neuropathy: Secondary | ICD-10-CM | POA: Diagnosis present

## 2015-07-19 DIAGNOSIS — R739 Hyperglycemia, unspecified: Secondary | ICD-10-CM | POA: Diagnosis present

## 2015-07-19 DIAGNOSIS — R062 Wheezing: Secondary | ICD-10-CM | POA: Diagnosis not present

## 2015-07-19 DIAGNOSIS — I953 Hypotension of hemodialysis: Secondary | ICD-10-CM | POA: Diagnosis not present

## 2015-07-19 DIAGNOSIS — R0789 Other chest pain: Secondary | ICD-10-CM

## 2015-07-19 DIAGNOSIS — N2581 Secondary hyperparathyroidism of renal origin: Secondary | ICD-10-CM | POA: Diagnosis present

## 2015-07-19 DIAGNOSIS — Z86718 Personal history of other venous thrombosis and embolism: Secondary | ICD-10-CM

## 2015-07-19 DIAGNOSIS — E1122 Type 2 diabetes mellitus with diabetic chronic kidney disease: Secondary | ICD-10-CM | POA: Diagnosis present

## 2015-07-19 DIAGNOSIS — E1151 Type 2 diabetes mellitus with diabetic peripheral angiopathy without gangrene: Secondary | ICD-10-CM | POA: Diagnosis present

## 2015-07-19 DIAGNOSIS — R0600 Dyspnea, unspecified: Secondary | ICD-10-CM

## 2015-07-19 DIAGNOSIS — F329 Major depressive disorder, single episode, unspecified: Secondary | ICD-10-CM | POA: Diagnosis present

## 2015-07-19 DIAGNOSIS — Z794 Long term (current) use of insulin: Secondary | ICD-10-CM

## 2015-07-19 DIAGNOSIS — R109 Unspecified abdominal pain: Secondary | ICD-10-CM

## 2015-07-19 DIAGNOSIS — Z79899 Other long term (current) drug therapy: Secondary | ICD-10-CM

## 2015-07-19 DIAGNOSIS — Z806 Family history of leukemia: Secondary | ICD-10-CM

## 2015-07-19 DIAGNOSIS — Z886 Allergy status to analgesic agent status: Secondary | ICD-10-CM

## 2015-07-19 DIAGNOSIS — N186 End stage renal disease: Secondary | ICD-10-CM | POA: Diagnosis present

## 2015-07-19 DIAGNOSIS — I120 Hypertensive chronic kidney disease with stage 5 chronic kidney disease or end stage renal disease: Secondary | ICD-10-CM | POA: Diagnosis present

## 2015-07-19 DIAGNOSIS — Z992 Dependence on renal dialysis: Secondary | ICD-10-CM

## 2015-07-19 DIAGNOSIS — I10 Essential (primary) hypertension: Secondary | ICD-10-CM | POA: Diagnosis present

## 2015-07-19 DIAGNOSIS — R079 Chest pain, unspecified: Secondary | ICD-10-CM

## 2015-07-19 DIAGNOSIS — H353290 Exudative age-related macular degeneration, unspecified eye, stage unspecified: Secondary | ICD-10-CM | POA: Diagnosis present

## 2015-07-19 DIAGNOSIS — E1121 Type 2 diabetes mellitus with diabetic nephropathy: Secondary | ICD-10-CM | POA: Diagnosis present

## 2015-07-19 DIAGNOSIS — IMO0002 Reserved for concepts with insufficient information to code with codable children: Secondary | ICD-10-CM | POA: Diagnosis present

## 2015-07-19 DIAGNOSIS — Z87441 Personal history of nephrotic syndrome: Secondary | ICD-10-CM

## 2015-07-19 DIAGNOSIS — E039 Hypothyroidism, unspecified: Secondary | ICD-10-CM | POA: Diagnosis present

## 2015-07-19 DIAGNOSIS — R072 Precordial pain: Secondary | ICD-10-CM | POA: Diagnosis present

## 2015-07-19 DIAGNOSIS — I209 Angina pectoris, unspecified: Secondary | ICD-10-CM | POA: Diagnosis present

## 2015-07-19 DIAGNOSIS — E1165 Type 2 diabetes mellitus with hyperglycemia: Principal | ICD-10-CM | POA: Diagnosis present

## 2015-07-19 LAB — CBC
HEMATOCRIT: 33.9 % — AB (ref 40.0–52.0)
HEMOGLOBIN: 11.7 g/dL — AB (ref 13.0–18.0)
MCH: 26.8 pg (ref 26.0–34.0)
MCHC: 34.5 g/dL (ref 32.0–36.0)
MCV: 77.8 fL — ABNORMAL LOW (ref 80.0–100.0)
Platelets: 311 10*3/uL (ref 150–440)
RBC: 4.35 MIL/uL — AB (ref 4.40–5.90)
RDW: 17.9 % — ABNORMAL HIGH (ref 11.5–14.5)
WBC: 6.2 10*3/uL (ref 3.8–10.6)

## 2015-07-19 LAB — TROPONIN I

## 2015-07-19 LAB — BASIC METABOLIC PANEL
ANION GAP: 10 (ref 5–15)
BUN: 27 mg/dL — ABNORMAL HIGH (ref 6–20)
CALCIUM: 8.7 mg/dL — AB (ref 8.9–10.3)
CO2: 29 mmol/L (ref 22–32)
Chloride: 94 mmol/L — ABNORMAL LOW (ref 101–111)
Creatinine, Ser: 3.3 mg/dL — ABNORMAL HIGH (ref 0.61–1.24)
GFR, EST AFRICAN AMERICAN: 25 mL/min — AB (ref >60)
GFR, EST NON AFRICAN AMERICAN: 21 mL/min — AB (ref >60)
GLUCOSE: 293 mg/dL — AB (ref 65–99)
POTASSIUM: 3.9 mmol/L (ref 3.5–5.1)
Sodium: 133 mmol/L — ABNORMAL LOW (ref 135–145)

## 2015-07-19 LAB — HEPATIC FUNCTION PANEL
ALBUMIN: 3.6 g/dL (ref 3.5–5.0)
ALT: 17 U/L (ref 17–63)
AST: 17 U/L (ref 15–41)
Alkaline Phosphatase: 100 U/L (ref 38–126)
Total Bilirubin: 0.8 mg/dL (ref 0.3–1.2)
Total Protein: 7.4 g/dL (ref 6.5–8.1)

## 2015-07-19 MED ORDER — ALBUTEROL SULFATE (2.5 MG/3ML) 0.083% IN NEBU
5.0000 mg | INHALATION_SOLUTION | Freq: Once | RESPIRATORY_TRACT | Status: AC
  Administered 2015-07-19: 5 mg via RESPIRATORY_TRACT
  Filled 2015-07-19: qty 6

## 2015-07-19 MED ORDER — IOPAMIDOL (ISOVUE-370) INJECTION 76%
100.0000 mL | Freq: Once | INTRAVENOUS | Status: AC | PRN
  Administered 2015-07-19: 100 mL via INTRAVENOUS

## 2015-07-19 MED ORDER — TECHNETIUM TO 99M ALBUMIN AGGREGATED
4.0000 | Freq: Once | INTRAVENOUS | Status: AC | PRN
  Administered 2015-07-19: 4.159 via INTRAVENOUS

## 2015-07-19 MED ORDER — IPRATROPIUM-ALBUTEROL 0.5-2.5 (3) MG/3ML IN SOLN
3.0000 mL | Freq: Once | RESPIRATORY_TRACT | Status: AC
  Administered 2015-07-19: 3 mL via RESPIRATORY_TRACT
  Filled 2015-07-19: qty 3

## 2015-07-19 MED ORDER — IPRATROPIUM-ALBUTEROL 0.5-2.5 (3) MG/3ML IN SOLN
RESPIRATORY_TRACT | Status: AC
  Filled 2015-07-19: qty 3

## 2015-07-19 MED ORDER — METHYLPREDNISOLONE SODIUM SUCC 125 MG IJ SOLR
125.0000 mg | Freq: Once | INTRAMUSCULAR | Status: AC
  Administered 2015-07-19: 125 mg via INTRAVENOUS
  Filled 2015-07-19: qty 2

## 2015-07-19 MED ORDER — TECHNETIUM TC 99M DIETHYLENETRIAME-PENTAACETIC ACID
30.0000 | Freq: Once | INTRAVENOUS | Status: AC | PRN
  Administered 2015-07-19: 30.846 via INTRAVENOUS

## 2015-07-19 MED ORDER — HYDROMORPHONE HCL 1 MG/ML IJ SOLN
1.0000 mg | INTRAMUSCULAR | Status: AC
  Administered 2015-07-20: 1 mg via INTRAVENOUS
  Filled 2015-07-19: qty 1

## 2015-07-19 MED ORDER — IPRATROPIUM-ALBUTEROL 0.5-2.5 (3) MG/3ML IN SOLN
3.0000 mL | Freq: Once | RESPIRATORY_TRACT | Status: AC
  Administered 2015-07-19: 3 mL via RESPIRATORY_TRACT

## 2015-07-19 NOTE — ED Notes (Signed)
Patient transported to CT 

## 2015-07-19 NOTE — ED Notes (Signed)
Patient given ice water per Dr. Fanny BienQuale.

## 2015-07-19 NOTE — ED Provider Notes (Signed)
[facility] Emergency Department Provider Note  ____________________________________________  Time seen: 11:45 AM  I have reviewed the triage vital signs and the nursing notes.   HISTORY  Chief Complaint Chest Pain    HPI Russell [name] is a 45 y.o. male sent to the ED from dialysis due to chest pain. He made it through almost the entire dialysis session which was on his routine schedule, and just during the last 5 minutes of the session they had to discontinue due to the pain. It 7 out of 10, midsternal, pleuritic. Aggravated by movement and palpation of the area. Radiates to his right arm. Associated with shortness of breath. 4 L of volume was removed during dialysis. Denies ever having chest pain like this before. Usually feels fatigued and malaise after his dialysis sessions but never chest pain.  Has a history of DVT that he used to take Xarelto 4 but this was discontinued more than a year ago.     Past Medical History  Diagnosis Date  . Hypertension   . DVT (deep venous thrombosis), H/o 01/2014-on Xarelto 03/24/2014    LLE  . Secondary DM with DKA-AG=16, BIcarb Nl 03/24/2014  . Age-related macular degeneration, wet, both eyes (HCC)     "I'm getting Alia treatments" (12/30/2014)  . Hypothyroidism   . Asthma   . Pneumonia 11/2013  . Daily headache   . Arthritis     "hands" (12/30/2014)  . Anxiety   . Depression   . Type 2 diabetes mellitus with diabetic nephropathy (HCC)   . Nephrotic syndrome 05/18/2014  . CKD stage 3 due to type 2 diabetes mellitus [facility](HCC)      Patient Active Problem List   Diagnosis Date Noted  . DKA (diabetic ketoacidoses) (HCC) 02/13/2015  . ESRD on dialysis (HCC) 02/13/2015  . Nausea and vomiting 02/13/2015  . Nephrotic syndrome 01/03/2015  . Hypertensive urgency 12/30/2014  . Essential hypertension, benign   . Thyroid activity decreased   . Hx of gastroesophageal reflux (GERD) 09/30/2014  . Prolonged Q-T interval on  ECG 09/30/2014  . Acute on chronic renal failure (HCC) 09/24/2014  . Diabetic neuropathy (HCC) 09/21/2014  . Uncontrolled diabetes mellitus type 2 with peripheral artery disease (HCC) 09/21/2014  . Morbid obesity (HCC) 09/21/2014  . Superficial thrombophlebitis 03/24/2014  . Hypothyroidism (acquired) 03/24/2014     Past Surgical History  Procedure Laterality Date  . Ankle fracture surgery Right 1988  . Eye surgery    . Esophagogastroduodenoscopy N/A 03/29/2014    Procedure: ESOPHAGOGASTRODUODENOSCOPY (EGD);  Surgeon: Dorena CookeyJohn Hayes, MD;  [facility];  Service: Endoscopy;  Laterality: N/A;  . Tonsillectomy and adenoidectomy  1970s  . Fracture surgery    . Av fistula placement Left 01/01/2015    Procedure: CREATION OF LEFT RADIAL CEPHALIC ARTERIOVENOUS (AV) FISTULA ;  Surgeon: Pryor OchoaJames D Lawson, MD;  [facility];  Service: Vascular;  Laterality: Left;  . Insertion of dialysis catheter Right 01/05/2015    Procedure: INSERTION OF RIGHT INTERNAL JUGULAR DIALYSIS CATHETER;  Surgeon: Fransisco HertzBrian L Chen, MD;  [facility];  Service: Vascular;  Laterality: Right;     Current Outpatient Rx  Name  Route  Sig  Dispense  Refill  . acetaminophen (TYLENOL) 500 MG tablet   Oral   Take 1 tablet (500 mg total) by mouth every 6 (six) hours as needed for moderate pain.   30 tablet   0   . albuterol (PROVENTIL HFA;VENTOLIN HFA) 108 (90 Base) MCG/ACT inhaler  Inhalation   Inhale 1 puff into the lungs every 6 (six) hours as needed for wheezing or shortness of breath.   1 Inhaler   11   . albuterol-ipratropium (COMBIVENT) 18-103 MCG/ACT inhaler   Inhalation   Inhale 2 puffs into the lungs every 4 (four) hours. Patient taking differently: Inhale 2 puffs into the lungs every 6 (six) hours as needed for wheezing or shortness of breath. Take as needed   1 Inhaler   11   . amLODipine (NORVASC) 10 MG tablet   Oral   Take 1 tablet (10 mg total) by mouth daily.   30 tablet   2   .  amoxicillin-clavulanate (AUGMENTIN) 875-125 MG tablet   Oral   Take 1 tablet by mouth daily.   20 tablet   0   . calcium acetate (PHOSLO) 667 MG capsule   Oral   Take 2 capsules (1,334 mg total) by mouth 3 (three) times daily with meals.   180 capsule   0   . citalopram (CELEXA) 40 MG tablet   Oral   Take 1.5 tablets (60 mg total) by mouth at bedtime.   90 tablet   3   . clonazePAM (KLONOPIN) 1 MG tablet   Oral   Take 1 tablet (1 mg total) by mouth 2 (two) times daily.   180 tablet   2   . cloNIDine (CATAPRES) 0.1 MG tablet   Oral   Take 1 tablet (0.1 mg total) by mouth 2 (two) times daily.   60 tablet   0   . cyclobenzaprine (FLEXERIL) 10 MG tablet   Oral   Take 1 tablet (10 mg total) by mouth at bedtime.   30 tablet   11   . gabapentin (NEURONTIN) 100 MG capsule   Oral   Take 2 capsules (200 mg total) by mouth 3 (three) times daily.   180 capsule   11   . HYDROmorphone (DILAUDID) 2 MG tablet   Oral   Take 1 tablet (2 mg total) by mouth every 8 (eight) hours as needed for severe pain.   90 tablet   0   . LANTUS SOLOSTAR 100 UNIT/ML Solostar Pen   Subcutaneous   Inject 60 Units into the skin 2 (two) times daily.   15 mL   11     Dispense as written.   Marland Kitchen levothyroxine (SYNTHROID, LEVOTHROID) 50 MCG tablet   Oral   Take 1 tablet (50 mcg total) by mouth daily.   90 tablet   3   . metoCLOPramide (REGLAN) 5 MG tablet   Oral   Take 1 tablet (5 mg total) by mouth every 8 (eight) hours as needed for nausea.   30 tablet   11   . metoprolol succinate (TOPROL-XL) 100 MG 24 hr tablet   Oral   Take 1 tablet (100 mg total) by mouth daily.   90 tablet   3   . mycophenolate (CELLCEPT) 500 MG tablet   Oral   Take 500 mg by mouth daily.         . promethazine (PHENERGAN) 25 MG tablet   Oral   Take 1 tablet (25 mg total) by mouth every 8 (eight) hours as needed for nausea.   90 tablet   11   . sucralfate (CARAFATE) 1 GM/10ML suspension   Oral    Take 1 g by mouth daily as needed (for throat irritation).          . terbinafine (LAMISIL)  250 MG tablet   Oral   Take 1 tablet (250 mg total) by mouth daily.   30 tablet   2      Allergies Ibuprofen and Nsaids   Family History  Problem Relation Age of Onset  . Obesity Mother     Patient states that family members have no other medical illnesses other than what I have described  . Heart disease      No family history  . Cancer Father 50    AML  . Kidney cancer Maternal Grandmother     Social History Social History  Substance Use Topics  . Smoking status: Never Smoker   . Smokeless tobacco: Never Used  . Alcohol Use: No    Review of Systems  Constitutional:   No fever or chills.  ENT:   No sore throat. No rhinorrhea. Cardiovascular:   Positive as above chest pain. Respiratory:   Positive shortness of breath. Gastrointestinal:   Negative for abdominal pain, vomiting and diarrhea.  Genitourinary:   Negative for dysuria or difficulty urinating. Musculoskeletal:   Negative for focal pain or swelling Neurological:   Negative for headaches 10-point ROS otherwise negative.  ____________________________________________   PHYSICAL EXAM:  VITAL SIGNS: ED Triage Vitals  Enc Vitals Group     BP 07/19/15 1140 120/86 mmHg     Pulse Rate 07/19/15 1140 77     Resp 07/19/15 1140 15     Temp 07/19/15 1140 98.6 F (37 C)     Temp Source 07/19/15 1140 Oral     SpO2 07/19/15 1140 98 %     Weight 07/19/15 1140 250 lb (113.399 kg)     Height 07/19/15 1140 6\' 2"  (1.88 m)     Head Cir --      Peak Flow --      Pain Score 07/19/15 1141 7     Pain Loc --      Pain Edu? --      Excl. in GC? --     Vital signs reviewed, nursing assessments reviewed.   Constitutional:   Alert and oriented. Well appearing and in no distress. Eyes:   No scleral icterus. No conjunctival pallor. PERRL. EOMI.  No nystagmus. ENT   Head:   Normocephalic and atraumatic.   Nose:   No  congestion/rhinnorhea. No septal hematoma   Mouth/Throat:   MMM, no pharyngeal erythema. No peritonsillar mass.    Neck:   No stridor. No SubQ emphysema. No meningismus. Hematological/Lymphatic/Immunilogical:   No cervical lymphadenopathy. Cardiovascular:   RRR. Symmetric bilateral radial and DP pulses.  No murmurs.  Respiratory:   Normal respiratory effort without tachypnea nor retractions. Diffuse expiratory wheezing  Gastrointestinal:   Soft and nontender. Non distended. There is no CVA tenderness.  No rebound, rigidity, or guarding. Genitourinary:   deferred Musculoskeletal:   Nontender with normal range of motion in all extremities. No joint effusions.  No lower extremity tenderness.  No edema. Chest pain is reproducible with palpation of the anterior chest over the sternum Neurologic:   Normal speech and language.  CN 2-10 normal. Motor grossly intact. No gross focal neurologic deficits are appreciated.  Skin:    Skin is warm, dry and intact. No rash noted.  No petechiae, purpura, or bullae.  ____________________________________________    LABS (pertinent positives/negatives) (all labs ordered are listed, but only abnormal results are displayed) Labs Reviewed  BASIC METABOLIC PANEL - Abnormal; Notable for the following:    Sodium 133 (*)  Chloride 94 (*)    Glucose, Bld 293 (*)    BUN 27 (*)    Creatinine, Ser 3.30 (*)    Calcium 8.7 (*)    GFR calc non Af Amer 21 (*)    GFR calc Af Amer 25 (*)    All other components within normal limits  CBC - Abnormal; Notable for the following:    RBC 4.35 (*)    Hemoglobin 11.7 (*)    HCT 33.9 (*)    MCV 77.8 (*)    RDW 17.9 (*)    All other components within normal limits  TROPONIN I  TROPONIN I   ____________________________________________   EKG  Interpreted by me Sinus rhythm rate of 77, normal axis and intervals. Poor R-wave progression in anterior precordial leads. Normal ST segments and T waves. QTC is within  normal limits.  ____________________________________________    RADIOLOGY  Chest x-ray unremarkable  ____________________________________________   PROCEDURES   ____________________________________________   INITIAL IMPRESSION / ASSESSMENT AND PLAN / ED COURSE  Pertinent labs & imaging results that were available during my care of the patient were reviewed by me and considered in my medical decision making (see chart for details).  Patient presents with atypical chest pain. Pleuritic in nature and in the setting of history of DVT, but also reproducible on exam. Initial chest x-ray EKG and labs are all unremarkable and essentially at baseline. Potassium is 3.9, BUN is 27. We'll check a repeat 3 hour interval troponin, as well as a CT angiogram of the chest to evaluate for PE given his medical history. Care of the patient be signed out to Dr. Fanny Bien to follow-up on the rest of the workup and determine disposition at that time.     ____________________________________________   FINAL CLINICAL IMPRESSION(S) / ED DIAGNOSES  Final diagnoses:  Atypical chest pain       Portions of this note were generated with dragon dictation software. Dictation errors may occur despite best attempts at proofreading.   Sharman Cheek, MD 07/19/15 [phone]

## 2015-07-19 NOTE — ED Notes (Signed)
Pt presents to ED via EMS from [facility] after developing chest pain and nausea during last 5 min of dialysis. Pt states they removed 4L. Pain 7/10, is midsternal, worse with deep breathing, sensitive on palpation, radiating to R arm. No cardiac hx per pt.

## 2015-07-19 NOTE — ED Notes (Signed)
Pt returned from CT °

## 2015-07-19 NOTE — ED Provider Notes (Signed)
I have been following with the patient. He had a CT angiogram of the timing was poor to rule out embolism. I discussed with Dr. Thedore MinsSingh of nephrology who recommended he not have a second as the patient does have some renal function/urination remaining.  I did discuss with the patient, I also spoke with Dr. Thedore MinsSingh and we will plan to get Dopplers of the lower extremities to further evaluate for possible DVT which would lead to a higher suspicion for pulmonary embolism. In addition we are able to arrange for a ventilation perfusion scan later this evening. On my discussion with the patient, he does report that he has a aching pain in the right lower chest and also in the right upper abdomen. She drinks printing a little bit of discomfort there for the last several hours. I will add on LFTs, on exam his lungs are mostly clear, he is in no distress but doesn't exhibit some mild end expiratory wheezing. He also has mild tenderness without rebound or guarding in the right upper abdomen. I'll proceed with also obtain an ultrasound of the right upper quadrant and LFTs while he is here.  ----------------------------------------- 6:57 PM on 07/19/2015 -----------------------------------------  The patient is resting comfortably. His respirations have improved, and wheezing has gone away. He does continue to notice slight achiness in the right lower chest region that does not radiate except it does feel he could becoming slightly from the upper abdomen as well.  ----------------------------------------- 11:57 PM on 07/19/2015 -----------------------------------------  Patient continued to have 7 out of 10 chest pain. At this point we'll give him Dilaudid, given the unexplained chest pain at this point we must suspect possibility of coronary disease those troponins are negative thus far. Attempted many times to page the on-call cardiologist, Dr. Antoine PocheHochrein including contacting his answering service with no response of  approximately 45 minutes. We'll admit the patient for further evaluation including chest pain rule out. The patient and his girlfriend are agreeable with this plan. Will give Dilaudid for pain at this point.  Hospital services contacted for admission.  Sharyn CreamerMark Glenna Brunkow, MD 07/19/15 [phone]

## 2015-07-19 NOTE — Discharge Instructions (Signed)
Chest Wall Pain °Chest wall pain is pain in or around the bones and muscles of your chest. Sometimes, an injury causes this pain. Sometimes, the cause may not be known. This pain may take several weeks or longer to get better. °HOME CARE INSTRUCTIONS  °Pay attention to any changes in your symptoms. Take these actions to help with your pain:  °· Rest as told by your health care provider.   °· Avoid activities that cause pain. These include any activities that use your chest muscles or your abdominal and side muscles to lift heavy items.    °· If directed, apply ice to the painful area: °¨ Put ice in a plastic bag. °¨ Place a towel between your skin and the bag. °¨ Leave the ice on for 20 minutes, 2-3 times per day. °· Take over-the-counter and prescription medicines only as told by your health care provider. °· Do not use tobacco products, including cigarettes, chewing tobacco, and e-cigarettes. If you need help quitting, ask your health care provider. °· Keep all follow-up visits as told by your health care provider. This is important. °SEEK MEDICAL CARE IF: °· You have a fever. °· Your chest pain becomes worse. °· You have new symptoms. °SEEK IMMEDIATE MEDICAL CARE IF: °· You have nausea or vomiting. °· You feel sweaty or light-headed. °· You have a cough with phlegm (sputum) or you cough up blood. °· You develop shortness of breath. °  °This information is not intended to replace advice given to you by your health care provider. Make sure you discuss any questions you have with your health care provider. °  °Document Released: 12/19/2004 Document Revised: 09/09/2014 Document Reviewed: 03/16/2014 °Elsevier Interactive Patient Education ©2016 [employer] ° °Nonspecific Chest Pain  °Chest pain can be caused by many different conditions. There is always a chance that your pain could be related to something serious, such as a heart attack or a blood clot in your lungs. Chest pain can also be caused by conditions that  are not life-threatening. If you have chest pain, it is very important to follow up with your health care provider. °CAUSES  °Chest pain can be caused by: °· Heartburn. °· Pneumonia or bronchitis. °· Anxiety or stress. °· Inflammation around your heart (pericarditis) or lung (pleuritis or pleurisy). °· A blood clot in your lung. °· A collapsed lung (pneumothorax). It can develop suddenly on its own (spontaneous pneumothorax) or from trauma to the chest. °· Shingles infection (varicella-zoster virus). °· Heart attack. °· Damage to the bones, muscles, and cartilage that make up your chest wall. This can include: °¨ Bruised bones due to injury. °¨ Strained muscles or cartilage due to frequent or repeated coughing or overwork. °¨ Fracture to one or more ribs. °¨ Sore cartilage due to inflammation (costochondritis). °RISK FACTORS  °Risk factors for chest pain may include: °· Activities that increase your risk for trauma or injury to your chest. °· Respiratory infections or conditions that cause frequent coughing. °· Medical conditions or overeating that can cause heartburn. °· Heart disease or family history of heart disease. °· Conditions or health behaviors that increase your risk of developing a blood clot. °· Having had chicken pox (varicella zoster). °SIGNS AND SYMPTOMS °Chest pain can feel like: °· Burning or tingling on the surface of your chest or deep in your chest. °· Crushing, pressure, aching, or squeezing pain. °· Dull or sharp pain that is worse when you move, cough, or take a deep breath. °· Pain that is also   felt in your back, neck, shoulder, or arm, or pain that spreads to any of these areas. °Your chest pain may come and go, or it may stay constant. °DIAGNOSIS °Lab tests or other studies may be needed to find the cause of your pain. Your health care provider may have you take a test called an ambulatory ECG (electrocardiogram). An ECG records your heartbeat patterns at the time the test is performed.  You may also have other tests, such as: °· Transthoracic echocardiogram (TTE). During echocardiography, sound waves are used to create a picture of all of the heart structures and to look at how blood flows through your heart. °· Transesophageal echocardiogram (TEE). This is a more advanced imaging test that obtains images from inside your body. It allows your health care provider to see your heart in finer detail. °· Cardiac monitoring. This allows your health care provider to monitor your heart rate and rhythm in real time. °· Holter monitor. This is a portable device that records your heartbeat and can help to diagnose abnormal heartbeats. It allows your health care provider to track your heart activity for several days, if needed. °· Stress tests. These can be done through exercise or by taking medicine that makes your heart beat more quickly. °· Blood tests. °· Imaging tests. °TREATMENT  °Your treatment depends on what is causing your chest pain. Treatment may include: °· Medicines. These may include: °¨ Acid blockers for heartburn. °¨ Anti-inflammatory medicine. °¨ Pain medicine for inflammatory conditions. °¨ Antibiotic medicine, if an infection is present. °¨ Medicines to dissolve blood clots. °¨ Medicines to treat coronary artery disease. °· Supportive care for conditions that do not require medicines. This may include: °¨ Resting. °¨ Applying heat or cold packs to injured areas. °¨ Limiting activities until pain decreases. °HOME CARE INSTRUCTIONS °· If you were prescribed an antibiotic medicine, finish it all even if you start to feel better. °· Avoid any activities that bring on chest pain. °· Do not use any tobacco products, including cigarettes, chewing tobacco, or electronic cigarettes. If you need help quitting, ask your health care provider. °· Do not drink alcohol. °· Take medicines only as directed by your health care provider. °· Keep all follow-up visits as directed by your health care provider.  This is important. This includes any further testing if your chest pain does not go away. °· If heartburn is the cause for your chest pain, you may be told to keep your head raised (elevated) while sleeping. This reduces the chance that acid will go from your stomach into your esophagus. °· Make lifestyle changes as directed by your health care provider. These may include: °¨ Getting regular exercise. Ask your health care provider to suggest some activities that are safe for you. °¨ Eating a heart-healthy diet. A registered dietitian can help you to learn healthy eating options. °¨ Maintaining a healthy weight. °¨ Managing diabetes, if necessary. °¨ Reducing stress. °SEEK MEDICAL CARE IF: °· Your chest pain does not go away after treatment. °· You have a rash with blisters on your chest. °· You have a fever. °SEEK IMMEDIATE MEDICAL CARE IF:  °· Your chest pain is worse. °· You have an increasing cough, or you cough up blood. °· You have severe abdominal pain. °· You have severe weakness. °· You faint. °· You have chills. °· You have sudden, unexplained chest discomfort. °· You have sudden, unexplained discomfort in your arms, back, neck, or jaw. °· You have shortness of breath at   any time. °· You suddenly start to sweat, or your skin gets clammy. °· You feel nauseous or you vomit. °· You suddenly feel light-headed or dizzy. °· Your heart begins to beat quickly, or it feels like it is skipping beats. °These symptoms may represent a serious problem that is an emergency. Do not wait to see if the symptoms will go away. Get medical help right away. Call your local emergency services (911 in the [place] Do not drive yourself to the hospital. °  °This information is not intended to replace advice given to you by your health care provider. Make sure you discuss any questions you have with your health care provider. °  °Document Released: 09/28/2004 Document Revised: 01/09/2014 Document Reviewed: 07/25/2013 °Elsevier  Interactive Patient Education ©2016 [employer] ° °

## 2015-07-20 DIAGNOSIS — E039 Hypothyroidism, unspecified: Secondary | ICD-10-CM | POA: Diagnosis present

## 2015-07-20 DIAGNOSIS — H353290 Exudative age-related macular degeneration, unspecified eye, stage unspecified: Secondary | ICD-10-CM | POA: Diagnosis present

## 2015-07-20 DIAGNOSIS — Z87441 Personal history of nephrotic syndrome: Secondary | ICD-10-CM | POA: Diagnosis not present

## 2015-07-20 DIAGNOSIS — Z992 Dependence on renal dialysis: Secondary | ICD-10-CM | POA: Diagnosis not present

## 2015-07-20 DIAGNOSIS — Z794 Long term (current) use of insulin: Secondary | ICD-10-CM | POA: Diagnosis not present

## 2015-07-20 DIAGNOSIS — E1151 Type 2 diabetes mellitus with diabetic peripheral angiopathy without gangrene: Secondary | ICD-10-CM | POA: Diagnosis present

## 2015-07-20 DIAGNOSIS — R062 Wheezing: Secondary | ICD-10-CM | POA: Diagnosis present

## 2015-07-20 DIAGNOSIS — N186 End stage renal disease: Secondary | ICD-10-CM | POA: Diagnosis present

## 2015-07-20 DIAGNOSIS — E1143 Type 2 diabetes mellitus with diabetic autonomic (poly)neuropathy: Secondary | ICD-10-CM | POA: Diagnosis present

## 2015-07-20 DIAGNOSIS — Z886 Allergy status to analgesic agent status: Secondary | ICD-10-CM | POA: Diagnosis not present

## 2015-07-20 DIAGNOSIS — K3184 Gastroparesis: Secondary | ICD-10-CM | POA: Diagnosis present

## 2015-07-20 DIAGNOSIS — Z9889 Other specified postprocedural states: Secondary | ICD-10-CM | POA: Diagnosis not present

## 2015-07-20 DIAGNOSIS — R072 Precordial pain: Secondary | ICD-10-CM | POA: Diagnosis present

## 2015-07-20 DIAGNOSIS — R739 Hyperglycemia, unspecified: Secondary | ICD-10-CM | POA: Diagnosis present

## 2015-07-20 DIAGNOSIS — Z8051 Family history of malignant neoplasm of kidney: Secondary | ICD-10-CM | POA: Diagnosis not present

## 2015-07-20 DIAGNOSIS — Z806 Family history of leukemia: Secondary | ICD-10-CM | POA: Diagnosis not present

## 2015-07-20 DIAGNOSIS — E1165 Type 2 diabetes mellitus with hyperglycemia: Secondary | ICD-10-CM | POA: Diagnosis present

## 2015-07-20 DIAGNOSIS — Z79899 Other long term (current) drug therapy: Secondary | ICD-10-CM | POA: Diagnosis not present

## 2015-07-20 DIAGNOSIS — N2581 Secondary hyperparathyroidism of renal origin: Secondary | ICD-10-CM | POA: Diagnosis present

## 2015-07-20 DIAGNOSIS — Z86718 Personal history of other venous thrombosis and embolism: Secondary | ICD-10-CM | POA: Diagnosis not present

## 2015-07-20 DIAGNOSIS — I120 Hypertensive chronic kidney disease with stage 5 chronic kidney disease or end stage renal disease: Secondary | ICD-10-CM | POA: Diagnosis present

## 2015-07-20 DIAGNOSIS — I209 Angina pectoris, unspecified: Secondary | ICD-10-CM | POA: Diagnosis present

## 2015-07-20 DIAGNOSIS — E1122 Type 2 diabetes mellitus with diabetic chronic kidney disease: Secondary | ICD-10-CM | POA: Diagnosis present

## 2015-07-20 DIAGNOSIS — I953 Hypotension of hemodialysis: Secondary | ICD-10-CM | POA: Diagnosis present

## 2015-07-20 DIAGNOSIS — E1121 Type 2 diabetes mellitus with diabetic nephropathy: Secondary | ICD-10-CM | POA: Diagnosis present

## 2015-07-20 DIAGNOSIS — F329 Major depressive disorder, single episode, unspecified: Secondary | ICD-10-CM | POA: Diagnosis present

## 2015-07-20 DIAGNOSIS — J45909 Unspecified asthma, uncomplicated: Secondary | ICD-10-CM | POA: Diagnosis present

## 2015-07-20 LAB — URINALYSIS COMPLETE WITH MICROSCOPIC (ARMC ONLY)
Bilirubin Urine: NEGATIVE
Glucose, UA: >500 mg/dL — AB
Ketones, ur: NEGATIVE mg/dL
Leukocytes, UA: NEGATIVE
Nitrite: NEGATIVE
PH: 5 (ref 5.0–8.0)
Specific Gravity, Urine: 1.026 (ref 1.005–1.030)

## 2015-07-20 LAB — GLUCOSE, CAPILLARY
Glucose-Capillary: >600 mg/dL (ref 65–99)
Glucose-Capillary: >600 mg/dL (ref 65–99)
Glucose-Capillary: >600 mg/dL (ref 65–99)
Glucose-Capillary: 546 mg/dL (ref 65–99)
Glucose-Capillary: 399 mg/dL — ABNORMAL HIGH (ref 65–99)
Glucose-Capillary: 177 mg/dL — ABNORMAL HIGH (ref 65–99)
Glucose-Capillary: 205 mg/dL — ABNORMAL HIGH (ref 65–99)

## 2015-07-20 LAB — BASIC METABOLIC PANEL
ANION GAP: 15 (ref 5–15)
BUN: 70 mg/dL — ABNORMAL HIGH (ref 6–20)
CALCIUM: 8.9 mg/dL (ref 8.9–10.3)
CO2: 22 mmol/L (ref 22–32)
CREATININE: 7.15 mg/dL — AB (ref 0.61–1.24)
Chloride: 88 mmol/L — ABNORMAL LOW (ref 101–111)
GFR calc Af Amer: 10 mL/min — ABNORMAL LOW (ref >60)
GFR, EST NON AFRICAN AMERICAN: 8 mL/min — AB (ref >60)
GLUCOSE: 446 mg/dL — AB (ref 65–99)
Potassium: 4.9 mmol/L (ref 3.5–5.1)
Sodium: 125 mmol/L — ABNORMAL LOW (ref 135–145)

## 2015-07-20 LAB — TROPONIN I: Troponin I: <0.03 ng/mL (ref <0.03)

## 2015-07-20 LAB — TSH: TSH: 1.243 u[IU]/mL (ref 0.350–4.500)

## 2015-07-20 LAB — GLUCOSE, RANDOM
Glucose, Bld: 849 mg/dL (ref 65–99)
Glucose, Bld: 684 mg/dL (ref 65–99)

## 2015-07-20 LAB — HEMOGLOBIN A1C: Hgb A1c MFr Bld: 16.8 % — ABNORMAL HIGH (ref 4.0–6.0)

## 2015-07-20 MED ORDER — ONDANSETRON HCL 4 MG/2ML IJ SOLN: 4.0000 mg | Freq: Four times a day (QID) | INTRAMUSCULAR | Status: DC | PRN

## 2015-07-20 MED ORDER — AMLODIPINE BESYLATE 10 MG PO TABS
10.0000 mg | ORAL_TABLET | Freq: Every day | ORAL | Status: DC
  Administered 2015-07-20 – 2015-07-21 (×2): 10 mg via ORAL
  Filled 2015-07-20: qty 2
  Filled 2015-07-20: qty 1

## 2015-07-20 MED ORDER — CITALOPRAM HYDROBROMIDE 20 MG PO TABS
60.0000 mg | ORAL_TABLET | Freq: Every day | ORAL | Status: DC
  Administered 2015-07-20 (×2): 60 mg via ORAL
  Filled 2015-07-20 (×2): qty 3

## 2015-07-20 MED ORDER — GABAPENTIN 100 MG PO CAPS
200.0000 mg | ORAL_CAPSULE | Freq: Three times a day (TID) | ORAL | Status: DC
  Administered 2015-07-20 – 2015-07-21 (×4): 200 mg via ORAL
  Filled 2015-07-20 (×5): qty 2

## 2015-07-20 MED ORDER — BUPROPION HCL 100 MG PO TABS
100.0000 mg | ORAL_TABLET | Freq: Every day | ORAL | Status: DC
  Administered 2015-07-20 – 2015-07-21 (×2): 100 mg via ORAL
  Filled 2015-07-20 (×2): qty 1

## 2015-07-20 MED ORDER — CLONAZEPAM 1 MG PO TABS
2.0000 mg | ORAL_TABLET | Freq: Every day | ORAL | Status: DC
  Administered 2015-07-20 (×2): 2 mg via ORAL
  Filled 2015-07-20 (×2): qty 4

## 2015-07-20 MED ORDER — INSULIN GLARGINE 100 UNIT/ML ~~LOC~~ SOLN
40.0000 [IU] | Freq: Two times a day (BID) | SUBCUTANEOUS | Status: DC
  Administered 2015-07-20: 40 [IU] via SUBCUTANEOUS
  Filled 2015-07-20 (×4): qty 0.4

## 2015-07-20 MED ORDER — INSULIN ASPART 100 UNIT/ML ~~LOC~~ SOLN
0.0000 [IU] | Freq: Every day | SUBCUTANEOUS | Status: DC
  Administered 2015-07-20: 2 [IU] via SUBCUTANEOUS
  Filled 2015-07-20: qty 2

## 2015-07-20 MED ORDER — HYDROCODONE-ACETAMINOPHEN 5-325 MG PO TABS
1.0000 | ORAL_TABLET | ORAL | Status: DC | PRN
  Administered 2015-07-20: 1 via ORAL
  Filled 2015-07-20: qty 1

## 2015-07-20 MED ORDER — INSULIN ASPART 100 UNIT/ML ~~LOC~~ SOLN
20.0000 [IU] | Freq: Three times a day (TID) | SUBCUTANEOUS | Status: DC
  Administered 2015-07-20 – 2015-07-21 (×2): 20 [IU] via SUBCUTANEOUS
  Filled 2015-07-20 (×2): qty 20

## 2015-07-20 MED ORDER — CYCLOBENZAPRINE HCL 10 MG PO TABS
10.0000 mg | ORAL_TABLET | Freq: Every day | ORAL | Status: DC
  Administered 2015-07-20 (×2): 10 mg via ORAL
  Filled 2015-07-20 (×2): qty 1

## 2015-07-20 MED ORDER — INSULIN ASPART 100 UNIT/ML ~~LOC~~ SOLN: 15.0000 [IU] | Freq: Three times a day (TID) | SUBCUTANEOUS | Status: DC

## 2015-07-20 MED ORDER — DOCUSATE SODIUM 100 MG PO CAPS
100.0000 mg | ORAL_CAPSULE | Freq: Two times a day (BID) | ORAL | Status: DC
  Administered 2015-07-20 – 2015-07-21 (×4): 100 mg via ORAL
  Filled 2015-07-20 (×4): qty 1

## 2015-07-20 MED ORDER — POLYETHYLENE GLYCOL 3350 17 G PO PACK: 17.0000 g | PACK | Freq: Every day | ORAL | Status: DC

## 2015-07-20 MED ORDER — PROMETHAZINE HCL 25 MG PO TABS: 25.0000 mg | ORAL_TABLET | Freq: Three times a day (TID) | ORAL | Status: DC | PRN

## 2015-07-20 MED ORDER — MYCOPHENOLATE MOFETIL 500 MG PO TABS
500.0000 mg | ORAL_TABLET | Freq: Every day | ORAL | Status: DC
  Filled 2015-07-20: qty 1

## 2015-07-20 MED ORDER — SODIUM CHLORIDE 0.9% FLUSH
3.0000 mL | Freq: Two times a day (BID) | INTRAVENOUS | Status: DC
  Administered 2015-07-20 – 2015-07-21 (×4): 3 mL via INTRAVENOUS

## 2015-07-20 MED ORDER — ACETAMINOPHEN 325 MG PO TABS: 650.0000 mg | ORAL_TABLET | Freq: Four times a day (QID) | ORAL | Status: DC | PRN

## 2015-07-20 MED ORDER — METOPROLOL SUCCINATE ER 100 MG PO TB24
100.0000 mg | ORAL_TABLET | Freq: Every day | ORAL | Status: DC
  Administered 2015-07-20 – 2015-07-21 (×2): 100 mg via ORAL
  Filled 2015-07-20: qty 1
  Filled 2015-07-20: qty 2

## 2015-07-20 MED ORDER — METOCLOPRAMIDE HCL 10 MG PO TABS: 5.0000 mg | ORAL_TABLET | Freq: Three times a day (TID) | ORAL | Status: DC | PRN

## 2015-07-20 MED ORDER — INSULIN ASPART 100 UNIT/ML ~~LOC~~ SOLN
12.0000 [IU] | Freq: Once | SUBCUTANEOUS | Status: AC
  Administered 2015-07-20: 12 [IU] via SUBCUTANEOUS
  Filled 2015-07-20: qty 12

## 2015-07-20 MED ORDER — HEPARIN SODIUM (PORCINE) 5000 UNIT/ML IJ SOLN
5000.0000 [IU] | Freq: Three times a day (TID) | INTRAMUSCULAR | Status: DC
  Administered 2015-07-20 – 2015-07-21 (×5): 5000 [IU] via SUBCUTANEOUS
  Filled 2015-07-20 (×5): qty 1

## 2015-07-20 MED ORDER — INSULIN GLARGINE 100 UNIT/ML ~~LOC~~ SOLN
30.0000 [IU] | Freq: Once | SUBCUTANEOUS | Status: AC
  Administered 2015-07-20: 30 [IU] via SUBCUTANEOUS
  Filled 2015-07-20: qty 0.3

## 2015-07-20 MED ORDER — INSULIN ASPART 100 UNIT/ML ~~LOC~~ SOLN: 20.0000 [IU] | Freq: Once | SUBCUTANEOUS | Status: DC

## 2015-07-20 MED ORDER — ONDANSETRON HCL 4 MG PO TABS: 4.0000 mg | ORAL_TABLET | Freq: Four times a day (QID) | ORAL | Status: DC | PRN

## 2015-07-20 MED ORDER — INSULIN ASPART 100 UNIT/ML ~~LOC~~ SOLN
20.0000 [IU] | Freq: Once | SUBCUTANEOUS | Status: AC
  Administered 2015-07-20: 20 [IU] via SUBCUTANEOUS
  Filled 2015-07-20: qty 20

## 2015-07-20 MED ORDER — CALCIUM ACETATE (PHOS BINDER) 667 MG PO CAPS
1334.0000 mg | ORAL_CAPSULE | Freq: Three times a day (TID) | ORAL | Status: DC
  Administered 2015-07-20 – 2015-07-21 (×5): 1334 mg via ORAL
  Filled 2015-07-20 (×5): qty 2

## 2015-07-20 MED ORDER — INSULIN ASPART 100 UNIT/ML ~~LOC~~ SOLN: 0.0000 [IU] | Freq: Three times a day (TID) | SUBCUTANEOUS | Status: DC

## 2015-07-20 MED ORDER — LEVOTHYROXINE SODIUM 50 MCG PO TABS
50.0000 ug | ORAL_TABLET | Freq: Every day | ORAL | Status: DC
  Administered 2015-07-20 – 2015-07-21 (×2): 50 ug via ORAL
  Filled 2015-07-20 (×2): qty 1

## 2015-07-20 MED ORDER — SUCRALFATE 1 GM/10ML PO SUSP
1.0000 g | Freq: Every day | ORAL | Status: DC | PRN
Start: 2015-07-20 — End: 2015-07-21

## 2015-07-20 MED ORDER — TERBINAFINE HCL 250 MG PO TABS
250.0000 mg | ORAL_TABLET | Freq: Every day | ORAL | Status: DC
  Administered 2015-07-20 – 2015-07-21 (×2): 250 mg via ORAL
  Filled 2015-07-20 (×2): qty 1

## 2015-07-20 MED ORDER — OMEPRAZOLE 40 MG PO CPDR: 40.0000 mg | DELAYED_RELEASE_CAPSULE | Freq: Two times a day (BID) | ORAL | Status: DC

## 2015-07-20 MED ORDER — ACETAMINOPHEN 650 MG RE SUPP: 650.0000 mg | Freq: Four times a day (QID) | RECTAL | Status: DC | PRN

## 2015-07-20 MED ORDER — ALBUTEROL SULFATE (2.5 MG/3ML) 0.083% IN NEBU: 3.0000 mL | INHALATION_SOLUTION | Freq: Four times a day (QID) | RESPIRATORY_TRACT | Status: DC | PRN

## 2015-07-20 MED ORDER — INSULIN ASPART 100 UNIT/ML ~~LOC~~ SOLN
35.0000 [IU] | Freq: Once | SUBCUTANEOUS | Status: AC
  Administered 2015-07-20: 35 [IU] via SUBCUTANEOUS
  Filled 2015-07-20: qty 35

## 2015-07-20 MED ORDER — CLONIDINE HCL 0.1 MG PO TABS
0.1000 mg | ORAL_TABLET | Freq: Two times a day (BID) | ORAL | Status: DC
  Administered 2015-07-20 – 2015-07-21 (×4): 0.1 mg via ORAL
  Filled 2015-07-20 (×4): qty 1

## 2015-07-20 MED ORDER — INSULIN GLARGINE 100 UNIT/ML ~~LOC~~ SOLN
60.0000 [IU] | Freq: Two times a day (BID) | SUBCUTANEOUS | Status: DC
  Administered 2015-07-20 – 2015-07-21 (×2): 60 [IU] via SUBCUTANEOUS
  Filled 2015-07-20 (×4): qty 0.6

## 2015-07-20 MED ORDER — INSULIN ASPART 100 UNIT/ML ~~LOC~~ SOLN
0.0000 [IU] | Freq: Three times a day (TID) | SUBCUTANEOUS | Status: DC
  Administered 2015-07-20: 20 [IU] via SUBCUTANEOUS
  Administered 2015-07-21: 11 [IU] via SUBCUTANEOUS
  Filled 2015-07-20: qty 20
  Filled 2015-07-20: qty 11

## 2015-07-20 MED ORDER — HYDROMORPHONE HCL 2 MG PO TABS: 2.0000 mg | ORAL_TABLET | Freq: Three times a day (TID) | ORAL | Status: DC | PRN

## 2015-07-20 MED ORDER — HYDROXYZINE HCL 25 MG PO TABS: 25.0000 mg | ORAL_TABLET | Freq: Two times a day (BID) | ORAL | Status: DC | PRN

## 2015-07-20 MED ORDER — INSULIN ASPART 100 UNIT/ML ~~LOC~~ SOLN
15.0000 [IU] | Freq: Once | SUBCUTANEOUS | Status: AC
  Administered 2015-07-20: 15 [IU] via INTRAVENOUS
  Filled 2015-07-20: qty 15

## 2015-07-20 NOTE — Progress Notes (Signed)
FSBS reading HIGH on glucometer, order for stat lab glucose put in.

## 2015-07-20 NOTE — Progress Notes (Signed)
FSBS 546, Dr. Elpidio AnisSudini aware,new orders received.

## 2015-07-20 NOTE — Progress Notes (Signed)
Novolog 15 units IV and Lantus 30 units SQ given, will continue to monitor pt.

## 2015-07-20 NOTE — Progress Notes (Signed)
Dr. Elpidio AnisSudini made aware of FSBS 399, orders received

## 2015-07-20 NOTE — Consult Note (Signed)
Date: 07/20/2015                  Patient Name:  Russell [name]  MRN: [id number]  DOB: 09-05-1970  Age / Sex: 45 y.o., male         PCP: [name], MD                 Service Requesting Consult: Internal medicine                  Reason for Consult: ESRD, dialysis             History of Present Illness: Patient is a 45 y.o. male with medical problems of End-stage renal disease on dialysis for the past 6 months, poorly controlled diabetes with complications of neuropathy, retinopathy, gastropathy, right leg DVT in January 2016, hypothyroidism, asthma, arthritis, chronic back pain requiring narcotics, who was admitted to [facility] on 07/19/2015 for evaluation of near syncope and chest pain.  Patient reported that he had hypotension during dialysis. His blood pressure was low to 70/40. He has been having chest pain over his right precordial area. In the emergency room, workup was done for pulmonary embolism. CT chest with IV contrast was inconclusive. VQ scan was low probability for pulmonary embolism. Patient reports that he still has some discomfort He denies any fevers, chills, cough No dysuria No blood in the urine or stool He denies constipation   Medications: Outpatient medications: Prescriptions prior to admission  Medication Sig Dispense Refill Last Dose  . acetaminophen (TYLENOL) 500 MG tablet Take 1 tablet (500 mg total) by mouth every 6 (six) hours as needed for moderate pain. 30 tablet 0 PRN at prn  . albuterol (PROVENTIL HFA;VENTOLIN HFA) 108 (90 Base) MCG/ACT inhaler Inhale 1 puff into the lungs every 6 (six) hours as needed for wheezing or shortness of breath. 1 Inhaler 11 PRN at prn  . albuterol-ipratropium (COMBIVENT) 18-103 MCG/ACT inhaler Inhale 2 puffs into the lungs every 4 (four) hours. (Patient taking differently: Inhale 2 puffs into the lungs every 6 (six) hours as needed for wheezing or shortness of breath. ) 1 Inhaler 11 PRN at prn  . amLODipine (NORVASC) 10  MG tablet Take 1 tablet (10 mg total) by mouth daily. 30 tablet 2 07/19/2015 at am  . buPROPion (WELLBUTRIN) 100 MG tablet Take 100 mg by mouth daily.   07/19/2015 at am  . calcium acetate (PHOSLO) 667 MG capsule Take 2 capsules (1,334 mg total) by mouth 3 (three) times daily with meals. 180 capsule 0 07/19/2015 at pm  . citalopram (CELEXA) 40 MG tablet Take 1.5 tablets (60 mg total) by mouth at bedtime. 90 tablet 3 07/18/2015 at pm  . clonazePAM (KLONOPIN) 2 MG tablet Take 2 mg by mouth at bedtime.   07/18/2015 at pm  . cloNIDine (CATAPRES) 0.1 MG tablet Take 1 tablet (0.1 mg total) by mouth 2 (two) times daily. (Patient taking differently: Take 0.2 mg by mouth 2 (two) times daily. ) 60 tablet 0 07/19/2015 at am  . cyclobenzaprine (FLEXERIL) 10 MG tablet Take 1 tablet (10 mg total) by mouth at bedtime. 30 tablet 11 07/18/2015 at pm  . gabapentin (NEURONTIN) 100 MG capsule Take 2 capsules (200 mg total) by mouth 3 (three) times daily. 180 capsule 11 07/19/2015 at pm  . HYDROmorphone (DILAUDID) 2 MG tablet Take 1 tablet (2 mg total) by mouth every 8 (eight) hours as needed for severe pain. 90 tablet 0 PRN at prn  .  hydrOXYzine (ATARAX/VISTARIL) 25 MG tablet Take 25 mg by mouth 2 (two) times daily as needed for anxiety.    PRN at prn  . insulin glargine (LANTUS) 100 UNIT/ML injection Inject 60 Units into the skin 2 (two) times daily.   07/19/2015 at am  . Insulin Lispro (HUMALOG KWIKPEN) 200 UNIT/ML SOPN Inject into the skin 3 (three) times daily as needed. For high blood sugar per sliding scale   PRN at prn  . levothyroxine (SYNTHROID, LEVOTHROID) 50 MCG tablet Take 1 tablet (50 mcg total) by mouth daily. 90 tablet 3 07/19/2015 at am  . metoCLOPramide (REGLAN) 5 MG tablet Take 1 tablet (5 mg total) by mouth every 8 (eight) hours as needed for nausea. 30 tablet 11 PRN at prn  . metoprolol succinate (TOPROL-XL) 100 MG 24 hr tablet Take 1 tablet (100 mg total) by mouth daily. (Patient taking differently: Take 100 mg  by mouth at bedtime. ) 90 tablet 3 07/18/2015 at 2200  . mycophenolate (CELLCEPT) 500 MG tablet Take 500 mg by mouth daily.   07/19/2015 at am  . promethazine (PHENERGAN) 25 MG tablet Take 1 tablet (25 mg total) by mouth every 8 (eight) hours as needed for nausea. 90 tablet 11 PRN at prn  . sucralfate (CARAFATE) 1 GM/10ML suspension Take 1 g by mouth daily as needed (for throat irritation).    PRN at prn  . terbinafine (LAMISIL) 250 MG tablet Take 1 tablet (250 mg total) by mouth daily. 30 tablet 2 07/19/2015 at am  . amoxicillin-clavulanate (AUGMENTIN) 875-125 MG tablet Take 1 tablet by mouth daily. (Patient not taking: Reported on 07/19/2015) 20 tablet 0     Current medications: Current Facility-Administered Medications  Medication Dose Route Frequency Provider Last Rate Last Dose  . acetaminophen (TYLENOL) tablet 650 mg  650 mg Oral Q6H PRN Arnaldo Natal, MD       Or  . acetaminophen (TYLENOL) suppository 650 mg  650 mg Rectal Q6H PRN Arnaldo Natal, MD      . albuterol (PROVENTIL) (2.5 MG/3ML) 0.083% nebulizer solution 3 mL  3 mL Inhalation Q6H PRN Arnaldo Natal, MD      . amLODipine (NORVASC) tablet 10 mg  10 mg Oral Daily Arnaldo Natal, MD   10 mg at 07/20/15 8119  . buPROPion [facility]) tablet 100 mg  100 mg Oral Daily Arnaldo Natal, MD   100 mg at 07/20/15 1478  . calcium acetate (PHOSLO) capsule 1,334 mg  1,334 mg Oral TID WC Arnaldo Natal, MD   1,334 mg at 07/20/15 1157  . citalopram (CELEXA) tablet 60 mg  60 mg Oral QHS Arnaldo Natal, MD   60 mg at 07/20/15 0240  . clonazePAM (KLONOPIN) tablet 2 mg  2 mg Oral QHS Arnaldo Natal, MD   2 mg at 07/20/15 0239  . cloNIDine (CATAPRES) tablet 0.1 mg  0.1 mg Oral BID Arnaldo Natal, MD   0.1 mg at 07/20/15 2956  . cyclobenzaprine (FLEXERIL) tablet 10 mg  10 mg Oral QHS Arnaldo Natal, MD   10 mg at 07/20/15 0240  . docusate sodium (COLACE) capsule 100 mg  100 mg Oral BID Arnaldo Natal, MD   100 mg at  07/20/15 2130  . gabapentin (NEURONTIN) capsule 200 mg  200 mg Oral TID Arnaldo Natal, MD   200 mg at 07/20/15 0921  . heparin injection 5,000 Units  5,000 Units Subcutaneous Q8H Arnaldo Natal, MD   5,000  Units at 07/20/15 0558  . HYDROcodone-acetaminophen (NORCO/VICODIN) 5-325 MG per tablet 1 tablet  1 tablet Oral Q4H PRN Arnaldo Natal, MD   1 tablet at 07/20/15 0240  . HYDROmorphone (DILAUDID) tablet 2 mg  2 mg Oral Q8H PRN Arnaldo Natal, MD      . hydrOXYzine (ATARAX/VISTARIL) tablet 25 mg  25 mg Oral BID PRN Arnaldo Natal, MD      . insulin aspart (novoLOG) injection 0-20 Units  0-20 Units Subcutaneous TID WC Milagros Loll, MD   0 Units at 07/20/15 1217  . insulin aspart (novoLOG) injection 0-5 Units  0-5 Units Subcutaneous QHS Srikar Sudini, MD      . insulin aspart (novoLOG) injection 20 Units  20 Units Subcutaneous TID WC Srikar Sudini, MD      . insulin glargine (LANTUS) injection 40 Units  40 Units Subcutaneous BID Arnaldo Natal, MD   40 Units at 07/20/15 0250  . levothyroxine (SYNTHROID, LEVOTHROID) tablet 50 mcg  50 mcg Oral QAC breakfast Arnaldo Natal, MD   50 mcg at 07/20/15 0745  . metoCLOPramide (REGLAN) tablet 5 mg  5 mg Oral Q8H PRN Arnaldo Natal, MD      . metoprolol succinate (TOPROL-XL) 24 hr tablet 100 mg  100 mg Oral Daily Arnaldo Natal, MD   100 mg at 07/20/15 1914  . mycophenolate (CELLCEPT) tablet 500 mg  500 mg Oral Daily Arnaldo Natal, MD   500 mg at 07/20/15 0925  . ondansetron (ZOFRAN) tablet 4 mg  4 mg Oral Q6H PRN Arnaldo Natal, MD       Or  . ondansetron [facility]) injection 4 mg  4 mg Intravenous Q6H PRN Arnaldo Natal, MD      . promethazine (PHENERGAN) tablet 25 mg  25 mg Oral Q8H PRN Arnaldo Natal, MD      . sodium chloride flush (NS) 0.9 % injection 3 mL  3 mL Intravenous Q12H Arnaldo Natal, MD   3 mL at 07/20/15 0924  . sucralfate (CARAFATE) 1 GM/10ML suspension 1 g  1 g Oral Daily PRN Arnaldo Natal, MD       . terbinafine (LAMISIL) tablet 250 mg  250 mg Oral Daily Arnaldo Natal, MD   250 mg at 07/20/15 7829      Allergies: Allergies  Allergen Reactions  . Ibuprofen Other (See Comments)    MD told him not to take due to kidney disease.  . Nsaids Other (See Comments)    Told to avoid all nsaids due to kidney disease-takes acetaminophen as needed      Past Medical History: Past Medical History  Diagnosis Date  . Hypertension   . DVT (deep venous thrombosis), H/o 01/2014-on Xarelto 03/24/2014    LLE  . Secondary DM with DKA-AG=16, BIcarb Nl 03/24/2014  . Age-related macular degeneration, wet, both eyes (HCC)     "I'm getting Alia treatments" (12/30/2014)  . Hypothyroidism   . Asthma   . Pneumonia 11/2013  . Daily headache   . Arthritis     "hands" (12/30/2014)  . Anxiety   . Depression   . Type 2 diabetes mellitus with diabetic nephropathy (HCC)   . Nephrotic syndrome 05/18/2014  . 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 Renal insufficiency      Past Surgical History: Past Surgical History  Procedure Laterality Date  . Ankle fracture surgery Right 1988  . Eye surgery    .  Esophagogastroduodenoscopy N/A 03/29/2014    Procedure: ESOPHAGOGASTRODUODENOSCOPY (EGD);  Surgeon: Dorena Cookey, MD;  Location: [facility];  Service: Endoscopy;  Laterality: N/A;  . Tonsillectomy and adenoidectomy  1970s  . Fracture surgery    . Av fistula placement Left 01/01/2015    Procedure: CREATION OF LEFT RADIAL CEPHALIC ARTERIOVENOUS (AV) FISTULA ;  Surgeon: Pryor Ochoa, MD;  Location: [facility] OR;  Service: Vascular;  Laterality: Left;  . Insertion of dialysis catheter Right 01/05/2015    Procedure: INSERTION OF RIGHT INTERNAL JUGULAR DIALYSIS CATHETER;  Surgeon: Fransisco Hertz, MD;  Location: [facility] OR;  Service: Vascular;  Laterality: Right;     Family History: Family History  Problem Relation Age of Onset  . Obesity Mother     Patient states that family members have no other  medical illnesses other than what I have described  . Heart disease      No family history  . Cancer Father 50    AML  . Kidney cancer Maternal Grandmother      Social History: Social History   Social History  . Marital Status: Single    Spouse Name: N/A  . Number of Children: N/A  . Years of Education: N/A   Occupational History  .      Maintenance Curator   Social History Main Topics  . Smoking status: Never Smoker   . Smokeless tobacco: Never Used  . Alcohol Use: No  . Drug Use: No  . Sexual Activity: Not Currently   Other Topics Concern  . Not on file   Social History Narrative   Patient currently lives with his fiance   Works with facilities   Nonsmoker, nondrinker        Review of Systems: Gen: No fevers, chills, weight changes HEENT: No vision problems, no hearing issues, no sore throat CV: Reports chest pain over right precordial area as described above Resp: Shortness of breath with exertion, no cough or sputum GI: Appetite is good, no nausea, vomiting, no blood in the stool GU : Patient still makes urine, no dysuria MS: Chronic back pain Derm:  Denies any acute rashes Psych: Reports depression Heme: Denies any blood disorders Neuro: Reports neuropathy Endocrine. Diabetes is poorly controlled  Vital Signs: Blood pressure 127/67, pulse 87, temperature 97.9 F (36.6 C), temperature source Oral, resp. rate 18, height 6\' 2"  (1.88 m), weight 115.622 kg (254 lb 14.4 oz), SpO2 92 %.   Intake/Output Summary (Last 24 hours) at 07/20/15 1253 Last data filed at 07/20/15 1130  Gross per 24 hour  Intake    243 ml  Output    250 ml  Net     -7 ml    Weight trends: Filed Weights   07/19/15 1140 07/20/15 0203  Weight: 113.399 kg (250 lb) 115.622 kg (254 lb 14.4 oz)    Physical Exam: General:  Obese gentleman, laying in the bed   HEENT Anicteric, moist oral mucous membranes   Neck:  Supple   Lungs: Normal breathing effort, clear to auscultation  bilaterally   Heart::  Regular rhythm, no rub or gallop   Abdomen: Soft, nontender, nondistended   Extremities:  No peripheral edema   Neurologic: Alert, oriented   Skin: No acute rashes   Access: Left forearm AV fistula           Lab results: Basic Metabolic Panel:  Recent Labs Lab 07/19/15 1159 07/20/15 0811 07/20/15 1051  NA 133*  --   --   K 3.9  --   --  CL 94*  --   --   CO2 29  --   --   GLUCOSE 293* 849* 684*  BUN 27*  --   --   CREATININE 3.30*  --   --   CALCIUM 8.7*  --   --     Liver Function Tests:  Recent Labs Lab 07/19/15 1159  AST 17  ALT 17  ALKPHOS 100  BILITOT 0.8  PROT 7.4  ALBUMIN 3.6   No results for input(s): LIPASE, AMYLASE in the last 168 hours. No results for input(s): AMMONIA in the last 168 hours.  CBC:  Recent Labs Lab 07/19/15 1159  WBC 6.2  HGB 11.7*  HCT 33.9*  MCV 77.8*  PLT 311    Cardiac Enzymes:  Recent Labs Lab 07/20/15 0427  TROPONINI <0.03    BNP: Invalid input(s): POCBNP  CBG:  Recent Labs Lab 07/20/15 0255 07/20/15 0731 07/20/15 1039  GLUCAP >600* >600* >600*    Microbiology: No results found for this or any previous visit (from the past 720 hour(s)).   Coagulation Studies: No results for input(s): LABPROT, INR in the last 72 hours.  Urinalysis:  Recent Labs  07/19/15 0011  COLORURINE YELLOW*  LABSPEC 1.026  PHURINE 5.0  GLUCOSEU >500*  HGBUR 1+*  BILIRUBINUR NEGATIVE  KETONESUR NEGATIVE  PROTEINUR >500*  NITRITE NEGATIVE  LEUKOCYTESUR NEGATIVE        Imaging: Dg Chest 2 View  07/19/2015  CLINICAL DATA:  Chest pain EXAM: CHEST  2 VIEW COMPARISON:  01/05/2015 FINDINGS: Cardiomediastinal silhouette is unremarkable. No infiltrate or pleural effusion. No pulmonary edema. Bony thorax is unremarkable. IMPRESSION: No active cardiopulmonary disease. Electronically Signed   By: Natasha MeadLiviu  Pop M.D.   On: 07/19/2015 12:17   Ct Angio Chest Pe W/cm &/or Wo Cm  07/19/2015  CLINICAL  DATA:  Chest pain during dialysis EXAM: CT ANGIOGRAPHY CHEST WITH CONTRAST TECHNIQUE: Multidetector CT imaging of the chest was performed using the standard protocol during bolus administration of intravenous contrast. Multiplanar CT image reconstructions and MIPs were obtained to evaluate the vascular anatomy. CONTRAST:  100 mL Isovue 370 IV COMPARISON:  Chest radiographs dated 07/19/2015 FINDINGS: Cardiovascular: Preferential opacification of the aorta and pulmonary veins during imaging. As such, evaluation for pulmonary embolism is limited to the main pulmonary arteries. No evidence of pulmonary embolism in the bilateral main pulmonary arteries. Patient cannot be satisfactorily evaluated for lobar, segmental, or subsegmental pulmonary emboli. No evidence of thoracic aortic aneurysm or dissection. Mild cardiomegaly.  No pericardial effusion. Coronary atherosclerosis in the LAD (series 4/ image 54). Mediastinum/Nodes: 8 mm short axis high right paratracheal node (series 4/ image 27). 9 mm short axis node in the right azygos esophageal recess. No suspicious axillary nodes. Visualized thyroid is unremarkable. Lungs/Pleura: Evaluation of the lung parenchyma is constrained by respiratory motion. Mild mosaic attenuation, suggesting air trapping. No focal consolidation. No suspicious pulmonary nodules. No focal consolidation. No pleural effusion or pneumothorax. Upper Abdomen: Visualized upper abdomen is unremarkable. Musculoskeletal: Visualized osseous structures are within normal limits. Review of the MIP images confirms the above findings. IMPRESSION: Preferential opacification of the aorta and pulmonary veins during imaging. Patient cannot be satisfactory evaluated for lobar, segmental, or subsegmental pulmonary emboli. At the discretion of the referring clinician, a repeat contrast bolus was not administered. No evidence of pulmonary embolism in the bilateral main pulmonary arteries. No evidence of thoracic aortic  aneurysm or dissection. No evidence of acute cardiopulmonary disease. Electronically Signed   By: Charline BillsSriyesh  Krishnan  M.D.   On: 07/19/2015 17:16   Nm Pulmonary Perf And Vent  07/19/2015  CLINICAL DATA:  Chest pain and dyspnea. EXAM: NUCLEAR MEDICINE VENTILATION - PERFUSION LUNG SCAN TECHNIQUE: Ventilation images were obtained in multiple projections using inhaled aerosol Tc-76m DTPA. Perfusion images were obtained in multiple projections after intravenous injection of Tc-50m MAA. RADIOPHARMACEUTICALS:  30.8 mCi Technetium-15m DTPA aerosol inhalation and 4.2 mCi Technetium-63m MAA IV COMPARISON:  Radiographs and CT earlier this day. CT was suboptimal contrast injection for evaluation of pulmonary embolus. FINDINGS: Ventilation: No focal ventilation defect. Mildly heterogeneous ventilation to the upper lobes bilaterally. Perfusion: No wedge shaped peripheral perfusion defects to suggest acute pulmonary embolism. IMPRESSION: Low probability for pulmonary embolus. Electronically Signed   By: Rubye Oaks M.D.   On: 07/19/2015 22:18   US Venous Img Lower Bilateral  07/19/2015  CLINICAL DATA:  Shortness of breath and chest pain.  History of DVT. EXAM: BILATERAL LOWER EXTREMITY VENOUS DOPPLER ULTRASOUND TECHNIQUE: Gray-scale sonography with graded compression, as well as color Doppler and duplex ultrasound were performed to evaluate the lower extremity deep venous systems from the level of the common femoral vein and including the common femoral, femoral, profunda femoral, popliteal and calf veins including the posterior tibial, peroneal and gastrocnemius veins when visible. The superficial great saphenous vein was also interrogated. Spectral Doppler was utilized to evaluate flow at rest and with distal augmentation maneuvers in the common femoral, femoral and popliteal veins. COMPARISON:  February 01, 2014 FINDINGS: RIGHT LOWER EXTREMITY Common Femoral Vein: No evidence of thrombus. Normal compressibility,  respiratory phasicity and response to augmentation. Saphenofemoral Junction: No evidence of thrombus. Normal compressibility and flow on color Doppler imaging. Profunda Femoral Vein: No evidence of thrombus. Normal compressibility and flow on color Doppler imaging. Femoral Vein: No evidence of thrombus. Normal compressibility, respiratory phasicity and response to augmentation. Popliteal Vein: No evidence of thrombus. Normal compressibility, respiratory phasicity and response to augmentation. Calf Veins: No evidence of thrombus. Normal compressibility and flow on color Doppler imaging. Superficial Great Saphenous Vein: No evidence of thrombus. Normal compressibility and flow on color Doppler imaging. Venous Reflux:  None. Other Findings:  None. LEFT LOWER EXTREMITY Common Femoral Vein: No evidence of thrombus. Normal compressibility, respiratory phasicity and response to augmentation. Saphenofemoral Junction: No evidence of thrombus. Normal compressibility and flow on color Doppler imaging. Profunda Femoral Vein: No evidence of thrombus. Normal compressibility and flow on color Doppler imaging. Femoral Vein: No evidence of thrombus. Normal compressibility, respiratory phasicity and response to augmentation. Popliteal Vein: No evidence of thrombus. Normal compressibility, respiratory phasicity and response to augmentation. Calf Veins: No evidence of thrombus. Normal compressibility and flow on color Doppler imaging. Superficial Great Saphenous Vein: No evidence of thrombus. Normal compressibility and flow on color Doppler imaging. Venous Reflux:  None. Other Findings:  None. IMPRESSION: No evidence of deep venous thrombosis. Electronically Signed   By: Gerome Sam III M.D   On: 07/19/2015 18:28   US Abdomen Limited Ruq  07/19/2015  CLINICAL DATA:  Right upper quadrant abdominal pain for 1 day. Chest pain. EXAM: US ABDOMEN LIMITED - RIGHT UPPER QUADRANT COMPARISON:  None. FINDINGS: Gallbladder: No gallstones  seen. There is no gallbladder wall thickening, pericholecystic fluid or other secondary sonographic signs of acute cholecystitis. No sonographic Murphy's sign elicited during the examination, per the sonographer. Common bile duct: Diameter: Normal at 5 mm Liver: No focal lesion identified. Within normal limits in parenchymal echogenicity. IMPRESSION: Normal right upper quadrant ultrasound. Electronically Signed   By:  Bary Richard M.D.   On: 07/19/2015 19:17      Assessment & Plan: Pt is a 45 y.o. yo male with medical problems of End-stage renal disease on dialysis for the past 6 months, poorly controlled diabetes with complications of neuropathy, retinopathy, gastropathy, right leg DVT in January 2016, hypothyroidism, asthma, arthritis, chronic back pain requiring narcotics, who was admitted to [facility] on 07/19/2015 for evaluation of near syncope and chest pain.   1. End-stage renal disease. Dialyzes at [facility] M/W/F schedule. Followed by Dr. [name] - We will arrange for dialysis while he is in the hospital - HD treatment tomorrow  2. Anemia of chronic kidney disease Hemoglobin acceptable at 11.7  3. Secondary hyperparathyroidism We will monitor phosphorus levels during this hospital stay Renal diet Continue PhosLo  4. Diabetes with complications of neuropathy, nephropathy Needs better control  5. Hypotension Pressure this morning 127/67 Currently getting amlodipine, metoprolol, clonidine We will continue to monitor his blood pressure Consider adding ARB if blood pressure starts to go higher

## 2015-07-20 NOTE — Progress Notes (Signed)
Dr. Elpidio AnisSudini made aware of Glucose of 849.  New orders received

## 2015-07-20 NOTE — Progress Notes (Addendum)
Inpatient Diabetes Program Recommendations  AACE/ADA: New Consensus Statement on Inpatient Glycemic Control (2015)  Target Ranges:  Prepandial:   less than 140 mg/dL      Peak postprandial:   less than 180 mg/dL (1-2 hours)      Critically ill patients:  140 - 180 mg/dL   Lab Results  Component Value Date   GLUCAP 546* 07/20/2015   HGBA1C 16.8* 07/20/2015    Review of Glycemic Control:  Results for Russell [name], Owin B (MRN [id number]) as of 07/20/2015 15:01  Ref. Range 07/20/2015 02:55 07/20/2015 07:31 07/20/2015 10:39 07/20/2015 14:23  Glucose-Capillary Latest Ref Range: 65-99 mg/dL >696>600 (HH) >295>600 (HH) >284>600 (HH) 546 (HH)  Results for Russell [name], [name] (MRN [id number]) as of 07/20/2015 15:01  Ref. Range 07/20/2015 08:11 07/20/2015 10:51  Glucose Latest Ref Range: 65-99 mg/dL 725849 (HH) 366684 (HH)    Diabetes history: Uncontrolled type 2 diabetes Outpatient Diabetes medications: Lantus 60 units bid, Humalog Kwikpen Current orders for Inpatient glycemic control:  Novolog resistant tid with meals and HS, Lantus 40 units bid  Inpatient Diabetes Program Recommendations:    Note A1C=16.8%.  Note that patients blood sugar very elevated this AM.  Patient has received Lantus 40 units at 2 AM and then another dose of Lantus 30 units at 0929.  Patient also received Novolog 15 units at 0920, 35 units at 1234, and 20 units at 1443.  May consider checking BMP.  Text page sent to MD.  If blood sugars remain elevated, may need IV insulin? Discussed with RN. Note history of ESRD therefore patient is likely sensitive to insulin-so consider waiting at least 4 hours between SQ Novolog insulin doses.    Thanks, Beryl MeagerJenny Aquarius Latouche, RN, BC-ADM Inpatient Diabetes Coordinator Pager [phone] (8a-5p)

## 2015-07-20 NOTE — Progress Notes (Signed)
Dr. Elpidio AnisSudini made aware of Glucose of 684, new orders received.

## 2015-07-20 NOTE — H&P (Signed)
Russell [name] is an 45 y.o. male.   Chief Complaint: Chest pain HPI: The patient with past medical history of end-stage renal disease on dialysis presents to the emergency department complaining of a near syncopal episode and chest pain. The patient had been at dialysis and was noted to have hypotension. The patient recalls feeling clammy but completed his treatment. He admits that he had some vague mild to moderate chest pain while undergoing treatment. However, after dialysis was completed he felt faint but did not lose consciousness. Oxygen was placed via nasal cannula and the patient transported to the emergency department. Since that time oxygen saturations have been normal. VQ scan confirmed no pulmonary embolus. Laboratory evaluation significant for negative cardiac biomarkers. The patient received multiple doses of pain medication but continued to complain of pain in his lower right chest that occasionally radiates substernally. He denies shortness of breath, nausea or vomiting. Due to ongoing chest pain the emergency department staff called for admission.  Past Medical History  Diagnosis Date  . Hypertension   . DVT (deep venous thrombosis), H/o 01/2014-on Xarelto 03/24/2014    LLE  . Secondary DM with DKA-AG=16, BIcarb Nl 03/24/2014  . Age-related macular degeneration, wet, both eyes ([place])     "I'm getting Alia treatments" (12/30/2014)  . Hypothyroidism   . Asthma   . Pneumonia 11/2013  . Daily headache   . Arthritis     "hands" (12/30/2014)  . Anxiety   . Depression   . Type 2 diabetes mellitus with diabetic nephropathy ([place])   . Nephrotic syndrome 05/18/2014  . CKD stage 3 due to type 2 diabetes mellitus ([place])   . Renal insufficiency     Past Surgical History  Procedure Laterality Date  . Ankle fracture surgery Right 1988  . Eye surgery    . Esophagogastroduodenoscopy N/A 03/29/2014    Procedure: ESOPHAGOGASTRODUODENOSCOPY (EGD);  Surgeon: Teena Irani, MD;  Location: [facility];  Service: Endoscopy;  Laterality: N/A;  . Tonsillectomy and adenoidectomy  1970s  . Fracture surgery    . Av fistula placement Left 01/01/2015    Procedure: CREATION OF LEFT RADIAL CEPHALIC ARTERIOVENOUS (AV) FISTULA ;  Surgeon: Mal Misty, MD;  Location: [facility];  Service: Vascular;  Laterality: Left;  . Insertion of dialysis catheter Right 01/05/2015    Procedure: INSERTION OF RIGHT INTERNAL JUGULAR DIALYSIS CATHETER;  Surgeon: Conrad Toombs, MD;  Location: [facility];  Service: Vascular;  Laterality: Right;    Family History  Problem Relation Age of Onset  . Obesity Mother     Patient states that family members have no other medical illnesses other than what I have described  . Heart disease      No family history  . Cancer Father 13    AML  . Kidney cancer Maternal Grandmother    Social History:  reports that he has never smoked. He has never used smokeless tobacco. He reports that he does not drink alcohol or use illicit drugs.  Allergies:  Allergies  Allergen Reactions  . Ibuprofen Other (See Comments)    MD told him not to take due to kidney disease.  . Nsaids Other (See Comments)    Told to avoid all nsaids due to kidney disease-takes acetaminophen as needed    Medications Prior to Admission  Medication Sig Dispense Refill  . acetaminophen (TYLENOL) 500 MG tablet Take 1 tablet (500 mg total) by mouth every 6 (six) hours as needed for moderate pain. 30 tablet 0  .  albuterol (PROVENTIL HFA;VENTOLIN HFA) 108 (90 Base) MCG/ACT inhaler Inhale 1 puff into the lungs every 6 (six) hours as needed for wheezing or shortness of breath. 1 Inhaler 11  . albuterol-ipratropium (COMBIVENT) 18-103 MCG/ACT inhaler Inhale 2 puffs into the lungs every 4 (four) hours. (Patient taking differently: Inhale 2 puffs into the lungs every 6 (six) hours as needed for wheezing or shortness of breath. ) 1 Inhaler 11  . amLODipine (NORVASC) 10 MG tablet Take 1 tablet (10 mg total) by mouth daily. 30  tablet 2  . buPROPion (WELLBUTRIN) 100 MG tablet Take 100 mg by mouth daily.    . calcium acetate (PHOSLO) 667 MG capsule Take 2 capsules (1,334 mg total) by mouth 3 (three) times daily with meals. 180 capsule 0  . citalopram (CELEXA) 40 MG tablet Take 1.5 tablets (60 mg total) by mouth at bedtime. 90 tablet 3  . clonazePAM (KLONOPIN) 2 MG tablet Take 2 mg by mouth at bedtime.    . cloNIDine (CATAPRES) 0.1 MG tablet Take 1 tablet (0.1 mg total) by mouth 2 (two) times daily. (Patient taking differently: Take 0.2 mg by mouth 2 (two) times daily. ) 60 tablet 0  . cyclobenzaprine (FLEXERIL) 10 MG tablet Take 1 tablet (10 mg total) by mouth at bedtime. 30 tablet 11  . gabapentin (NEURONTIN) 100 MG capsule Take 2 capsules (200 mg total) by mouth 3 (three) times daily. 180 capsule 11  . HYDROmorphone (DILAUDID) 2 MG tablet Take 1 tablet (2 mg total) by mouth every 8 (eight) hours as needed for severe pain. 90 tablet 0  . hydrOXYzine (ATARAX/VISTARIL) 25 MG tablet Take 25 mg by mouth 2 (two) times daily as needed for anxiety.     . insulin glargine (LANTUS) 100 UNIT/ML injection Inject 60 Units into the skin 2 (two) times daily.    . Insulin Lispro (HUMALOG KWIKPEN) 200 UNIT/ML SOPN Inject into the skin 3 (three) times daily as needed. For high blood sugar per sliding scale    . levothyroxine (SYNTHROID, LEVOTHROID) 50 MCG tablet Take 1 tablet (50 mcg total) by mouth daily. 90 tablet 3  . metoCLOPramide (REGLAN) 5 MG tablet Take 1 tablet (5 mg total) by mouth every 8 (eight) hours as needed for nausea. 30 tablet 11  . metoprolol succinate (TOPROL-XL) 100 MG 24 hr tablet Take 1 tablet (100 mg total) by mouth daily. (Patient taking differently: Take 100 mg by mouth at bedtime. ) 90 tablet 3  . mycophenolate (CELLCEPT) 500 MG tablet Take 500 mg by mouth daily.    . promethazine (PHENERGAN) 25 MG tablet Take 1 tablet (25 mg total) by mouth every 8 (eight) hours as needed for nausea. 90 tablet 11  . sucralfate  (CARAFATE) 1 GM/10ML suspension Take 1 g by mouth daily as needed (for throat irritation).     . terbinafine (LAMISIL) 250 MG tablet Take 1 tablet (250 mg total) by mouth daily. 30 tablet 2  . amoxicillin-clavulanate (AUGMENTIN) 875-125 MG tablet Take 1 tablet by mouth daily. (Patient not taking: Reported on 07/19/2015) 20 tablet 0    Results for orders placed or performed during the hospital encounter of 07/19/15 (from the past 48 hour(s))  Urinalysis complete, with microscopic [facility] only)     Status: Abnormal   Collection Time: 07/19/15 12:11 AM  Result Value Ref Range   Color, Urine YELLOW (A) YELLOW   APPearance HAZY (A) CLEAR   Glucose, UA >500 (A) NEGATIVE mg/dL   Bilirubin Urine NEGATIVE NEGATIVE  Ketones, ur NEGATIVE NEGATIVE mg/dL   Specific Gravity, Urine 1.026 1.005 - 1.030   Hgb urine dipstick 1+ (A) NEGATIVE   pH 5.0 5.0 - 8.0   Protein, ur >500 (A) NEGATIVE mg/dL   Nitrite NEGATIVE NEGATIVE   Leukocytes, UA NEGATIVE NEGATIVE   RBC / HPF 0-5 0 - 5 RBC/hpf   WBC, UA 6-30 0 - 5 WBC/hpf   Bacteria, UA RARE (A) NONE SEEN   Squamous Epithelial / LPF 0-5 (A) NONE SEEN   Mucous PRESENT    Hyaline Casts, UA PRESENT   Basic metabolic panel     Status: Abnormal   Collection Time: 07/19/15 11:59 AM  Result Value Ref Range   Sodium 133 (L) 135 - 145 mmol/L   Potassium 3.9 3.5 - 5.1 mmol/L   Chloride 94 (L) 101 - 111 mmol/L   CO2 29 22 - 32 mmol/L   Glucose, Bld 293 (H) 65 - 99 mg/dL   BUN 27 (H) 6 - 20 mg/dL   Creatinine, Ser 3.30 (H) 0.61 - 1.24 mg/dL   Calcium 8.7 (L) 8.9 - 10.3 mg/dL   GFR calc non Af Amer 21 (L) >60 mL/min   GFR calc Af Amer 25 (L) >60 mL/min    Comment: (NOTE) The eGFR has been calculated using the CKD EPI equation. This calculation has not been validated in all clinical situations. eGFR's persistently <60 mL/min signify possible Chronic Kidney Disease.    Anion gap 10 5 - 15  CBC     Status: Abnormal   Collection Time: 07/19/15 11:59 AM  Result  Value Ref Range   WBC 6.2 3.8 - 10.6 K/uL   RBC 4.35 (L) 4.40 - 5.90 MIL/uL   Hemoglobin 11.7 (L) 13.0 - 18.0 g/dL   HCT 33.9 (L) 40.0 - 52.0 %   MCV 77.8 (L) 80.0 - 100.0 fL   MCH 26.8 26.0 - 34.0 pg   MCHC 34.5 32.0 - 36.0 g/dL   RDW 17.9 (H) 11.5 - 14.5 %   Platelets 311 150 - 440 K/uL  Troponin I     Status: None   Collection Time: 07/19/15 11:59 AM  Result Value Ref Range   Troponin I <0.03 <0.03 ng/mL  Hepatic function panel     Status: Abnormal   Collection Time: 07/19/15 11:59 AM  Result Value Ref Range   Total Protein 7.4 6.5 - 8.1 g/dL   Albumin 3.6 3.5 - 5.0 g/dL   AST 17 15 - 41 U/L   ALT 17 17 - 63 U/L   Alkaline Phosphatase 100 38 - 126 U/L   Total Bilirubin 0.8 0.3 - 1.2 mg/dL   Bilirubin, Direct <0.1 (L) 0.1 - 0.5 mg/dL   Indirect Bilirubin NOT CALCULATED 0.3 - 0.9 mg/dL  Troponin I     Status: None   Collection Time: 07/19/15  3:29 PM  Result Value Ref Range   Troponin I <0.03 <0.03 ng/mL  Glucose, capillary     Status: Abnormal   Collection Time: 07/20/15  2:55 AM  Result Value Ref Range   Glucose-Capillary >600 (HH) 65 - 99 mg/dL   Comment 1 Call MD NNP PA CNM   Troponin I     Status: None   Collection Time: 07/20/15  4:27 AM  Result Value Ref Range   Troponin I <0.03 <0.03 ng/mL  TSH     Status: None   Collection Time: 07/20/15  4:27 AM  Result Value Ref Range   TSH 1.243 0.350 -  4.500 uIU/mL   Dg Chest 2 View  07/19/2015  CLINICAL DATA:  Chest pain EXAM: CHEST  2 VIEW COMPARISON:  01/05/2015 FINDINGS: Cardiomediastinal silhouette is unremarkable. No infiltrate or pleural effusion. No pulmonary edema. Bony thorax is unremarkable. IMPRESSION: No active cardiopulmonary disease. Electronically Signed   By: Lahoma Crocker M.D.   On: 07/19/2015 12:17   Ct Angio Chest Pe W/cm &/or Wo Cm  07/19/2015  CLINICAL DATA:  Chest pain during dialysis EXAM: CT ANGIOGRAPHY CHEST WITH CONTRAST TECHNIQUE: Multidetector CT imaging of the chest was performed using the  standard protocol during bolus administration of intravenous contrast. Multiplanar CT image reconstructions and MIPs were obtained to evaluate the vascular anatomy. CONTRAST:  100 mL Isovue 370 IV COMPARISON:  Chest radiographs dated 07/19/2015 FINDINGS: Cardiovascular: Preferential opacification of the aorta and pulmonary veins during imaging. As such, evaluation for pulmonary embolism is limited to the main pulmonary arteries. No evidence of pulmonary embolism in the bilateral main pulmonary arteries. Patient cannot be satisfactorily evaluated for lobar, segmental, or subsegmental pulmonary emboli. No evidence of thoracic aortic aneurysm or dissection. Mild cardiomegaly.  No pericardial effusion. Coronary atherosclerosis in the LAD (series 4/ image 54). Mediastinum/Nodes: 8 mm short axis high right paratracheal node (series 4/ image 27). 9 mm short axis node in the right azygos esophageal recess. No suspicious axillary nodes. Visualized thyroid is unremarkable. Lungs/Pleura: Evaluation of the lung parenchyma is constrained by respiratory motion. Mild mosaic attenuation, suggesting air trapping. No focal consolidation. No suspicious pulmonary nodules. No focal consolidation. No pleural effusion or pneumothorax. Upper Abdomen: Visualized upper abdomen is unremarkable. Musculoskeletal: Visualized osseous structures are within normal limits. Review of the MIP images confirms the above findings. IMPRESSION: Preferential opacification of the aorta and pulmonary veins during imaging. Patient cannot be satisfactory evaluated for lobar, segmental, or subsegmental pulmonary emboli. At the discretion of the referring clinician, a repeat contrast bolus was not administered. No evidence of pulmonary embolism in the bilateral main pulmonary arteries. No evidence of thoracic aortic aneurysm or dissection. No evidence of acute cardiopulmonary disease. Electronically Signed   By: Julian Hy M.D.   On: 07/19/2015 17:16    Nm Pulmonary Perf And Vent  07/19/2015  CLINICAL DATA:  Chest pain and dyspnea. EXAM: NUCLEAR MEDICINE VENTILATION - PERFUSION LUNG SCAN TECHNIQUE: Ventilation images were obtained in multiple projections using inhaled aerosol Tc-49mDTPA. Perfusion images were obtained in multiple projections after intravenous injection of Tc-917mAA. RADIOPHARMACEUTICALS:  30.8 mCi Technetium-9950mPA aerosol inhalation and 4.2 mCi Technetium-42m57m IV COMPARISON:  Radiographs and CT earlier this day. CT was suboptimal contrast injection for evaluation of pulmonary embolus. FINDINGS: Ventilation: No focal ventilation defect. Mildly heterogeneous ventilation to the upper lobes bilaterally. Perfusion: No wedge shaped peripheral perfusion defects to suggest acute pulmonary embolism. IMPRESSION: Low probability for pulmonary embolus. Electronically Signed   By: MelaJeb Levering.   On: 07/19/2015 22:18   Us VK[place]ous Img Lower Bilateral  07/19/2015  CLINICAL DATA:  Shortness of breath and chest pain.  History of DVT. EXAM: BILATERAL LOWER EXTREMITY VENOUS DOPPLER ULTRASOUND TECHNIQUE: Gray-scale sonography with graded compression, as well as color Doppler and duplex ultrasound were performed to evaluate the lower extremity deep venous systems from the level of the common femoral vein and including the common femoral, femoral, profunda femoral, popliteal and calf veins including the posterior tibial, peroneal and gastrocnemius veins when visible. The superficial great saphenous vein was also interrogated. Spectral Doppler was utilized to evaluate flow at rest and with  distal augmentation maneuvers in the common femoral, femoral and popliteal veins. COMPARISON:  February 01, 2014 FINDINGS: RIGHT LOWER EXTREMITY Common Femoral Vein: No evidence of thrombus. Normal compressibility, respiratory phasicity and response to augmentation. Saphenofemoral Junction: No evidence of thrombus. Normal compressibility and flow on color Doppler  imaging. Profunda Femoral Vein: No evidence of thrombus. Normal compressibility and flow on color Doppler imaging. Femoral Vein: No evidence of thrombus. Normal compressibility, respiratory phasicity and response to augmentation. Popliteal Vein: No evidence of thrombus. Normal compressibility, respiratory phasicity and response to augmentation. Calf Veins: No evidence of thrombus. Normal compressibility and flow on color Doppler imaging. Superficial Great Saphenous Vein: No evidence of thrombus. Normal compressibility and flow on color Doppler imaging. Venous Reflux:  None. Other Findings:  None. LEFT LOWER EXTREMITY Common Femoral Vein: No evidence of thrombus. Normal compressibility, respiratory phasicity and response to augmentation. Saphenofemoral Junction: No evidence of thrombus. Normal compressibility and flow on color Doppler imaging. Profunda Femoral Vein: No evidence of thrombus. Normal compressibility and flow on color Doppler imaging. Femoral Vein: No evidence of thrombus. Normal compressibility, respiratory phasicity and response to augmentation. Popliteal Vein: No evidence of thrombus. Normal compressibility, respiratory phasicity and response to augmentation. Calf Veins: No evidence of thrombus. Normal compressibility and flow on color Doppler imaging. Superficial Great Saphenous Vein: No evidence of thrombus. Normal compressibility and flow on color Doppler imaging. Venous Reflux:  None. Other Findings:  None. IMPRESSION: No evidence of deep venous thrombosis. Electronically Signed   By: Dorise Bullion III M.D   On: 07/19/2015 18:28   US Abdomen Limited Ruq  07/19/2015  CLINICAL DATA:  Right upper quadrant abdominal pain for 1 day. Chest pain. EXAM: US ABDOMEN LIMITED - RIGHT UPPER QUADRANT COMPARISON:  None. FINDINGS: Gallbladder: No gallstones seen. There is no gallbladder wall thickening, pericholecystic fluid or other secondary sonographic signs of acute cholecystitis. No sonographic Murphy's  sign elicited during the examination, per the sonographer. Common bile duct: Diameter: Normal at 5 mm Liver: No focal lesion identified. Within normal limits in parenchymal echogenicity. IMPRESSION: Normal right upper quadrant ultrasound. Electronically Signed   By: Franki Cabot M.D.   On: 07/19/2015 19:17    Review of Systems  Constitutional: Positive for diaphoresis. Negative for fever and chills.  HENT: Negative for sore throat and tinnitus.   Eyes: Negative for blurred vision and redness.  Respiratory: Negative for cough and shortness of breath.   Cardiovascular: Positive for chest pain. Negative for palpitations, orthopnea and PND.  Gastrointestinal: Negative for nausea, vomiting, abdominal pain and diarrhea.  Genitourinary: Negative for dysuria, urgency and frequency.  Musculoskeletal: Negative for myalgias and joint pain.  Skin: Negative for rash.       No lesions  Neurological: Negative for speech change, focal weakness and weakness.  Endo/Heme/Allergies: Does not bruise/bleed easily.       No temperature intolerance  Psychiatric/Behavioral: Negative for depression and suicidal ideas.    Blood pressure 100/55, pulse 91, temperature 98 F (36.7 C), temperature source Oral, resp. rate 20, height _0  (1.88 m), weight 115.622 kg (254 lb 14.4 oz), SpO2 94 %. Physical Exam  Nursing note and vitals reviewed. Constitutional: He is oriented to person, place, and time. He appears well-developed and well-nourished. No distress.  HENT:  Head: Normocephalic and atraumatic.  Mouth/Throat: Oropharynx is clear and moist.  Eyes: Conjunctivae and EOM are normal. Pupils are equal, round, and reactive to light. No scleral icterus.  Neck: Normal range of motion. Neck supple. No JVD present. No  tracheal deviation present. No thyromegaly present.  Cardiovascular: Normal rate, regular rhythm and normal heart sounds.  Exam reveals no gallop and no friction rub.   No murmur heard. Respiratory:  Effort normal and breath sounds normal. No respiratory distress.  GI: Soft. Bowel sounds are normal. He exhibits no distension. There is no tenderness.  Genitourinary:  Deferred  Musculoskeletal: Normal range of motion. He exhibits no edema.  Lymphadenopathy:    He has no cervical adenopathy.  Neurological: He is alert and oriented to person, place, and time. No cranial nerve deficit.  Skin: Skin is warm and dry. No rash noted. No erythema.  Psychiatric: He has a normal mood and affect. His behavior is normal. Judgment and thought content normal.     Assessment/Plan This is a 45 year old male admitted for chest pain. 1. Chest pain: Atypical; long lasting and absence of elevated troponin or EKG changes. Continue to monitor telemetry and follow troponin. Cardiology consultation at the discretion of the primary team. 2. End-stage renal disease: On dialysis. The patient is not due for treatment another 48 hours. No indication for acute dialysis at this time. 3. Essential hypertension: Continue amlodipine, clonidine and metoprolol once pressure is improved 4. Diabetes mellitus type 2: The patient is on large amounts of insulin at home. I have reduced his basal insulin dose for hospital diet and started sliding scale insulin. 5. Depression: Continue Celexa and Wellbutrin 6. Hypothyroidism: Continue Synthroid 7. DVT prophylaxis: Heparin 8. GI prophylaxis: None The patient is a full code. Time spent on admission was inpatient care possibly 45 minutes  Harrie Foreman, MD 07/20/2015, 7:08 AM

## 2015-07-20 NOTE — Progress Notes (Signed)
[facility] [place] [facility]   PATIENT NAME: Russell [name]    MR#:  [id number]  DATE OF BIRTH:  [birth year]  SUBJECTIVE:  CHIEF COMPLAINT:   Chief Complaint  Patient presents with  . Chest Pain   Mild abd pain is improved  REVIEW OF SYSTEMS:    Review of Systems  Constitutional: Positive for malaise/fatigue.  Gastrointestinal: Positive for abdominal pain and constipation. Negative for nausea and diarrhea.    DRUG ALLERGIES:   Allergies  Allergen Reactions  . Ibuprofen Other (See Comments)    MD told him not to take due to kidney disease.  . Nsaids Other (See Comments)    Told to avoid all nsaids due to kidney disease-takes acetaminophen as needed    VITALS:  Blood pressure 127/67, pulse 87, temperature 97.9 F (36.6 C), temperature source Oral, resp. rate 18, height 6\' 2"  (1.88 m), weight 115.622 kg (254 lb 14.4 oz), SpO2 92 %.  PHYSICAL EXAMINATION:   Physical Exam  GENERAL:  45 y.o.-year-old patient lying in the bed with no acute distress.  EYES: Pupils equal, round, reactive to light and accommodation. No scleral icterus. Extraocular muscles intact.  HEENT: Head atraumatic, normocephalic. Oropharynx and nasopharynx clear.  NECK:  Supple, no jugular venous distention. No thyroid enlargement, no tenderness.  LUNGS: Normal breath sounds bilaterally, no wheezing, rales, rhonchi. No use of accessory muscles of respiration.  CARDIOVASCULAR: S1, S2 normal. No murmurs, rubs, or gallops.  ABDOMEN: Soft, nontender, nondistended. Bowel sounds present. No organomegaly or mass.  EXTREMITIES: No cyanosis, clubbing or edema b/l.    NEUROLOGIC: Cranial nerves II through XII are intact. No focal Motor or sensory deficits b/l.   PSYCHIATRIC: The patient is alert and oriented x 3.  SKIN: No obvious rash, lesion, or ulcer.   LABORATORY PANEL:   CBC  Recent Labs Lab 07/19/15 1159  WBC 6.2  HGB 11.7*  HCT 33.9*  PLT 311    ------------------------------------------------------------------------------------------------------------------ Chemistries   Recent Labs Lab 07/19/15 1159  07/20/15 1535  NA 133*  --  125*  K 3.9  --  4.9  CL 94*  --  88*  CO2 29  --  22  GLUCOSE 293*  < > 446*  BUN 27*  --  70*  CREATININE 3.30*  --  7.15*  CALCIUM 8.7*  --  8.9  AST 17  --   --   ALT 17  --   --   ALKPHOS 100  --   --   BILITOT 0.8  --   --   < > = values in this interval not displayed. ------------------------------------------------------------------------------------------------------------------  Cardiac Enzymes  Recent Labs Lab 07/20/15 0427  TROPONINI <0.03   ------------------------------------------------------------------------------------------------------------------  RADIOLOGY:  Dg Chest 2 View  07/19/2015  CLINICAL DATA:  Chest pain EXAM: CHEST  2 VIEW COMPARISON:  01/05/2015 FINDINGS: Cardiomediastinal silhouette is unremarkable. No infiltrate or pleural effusion. No pulmonary edema. Bony thorax is unremarkable. IMPRESSION: No active cardiopulmonary disease. Electronically Signed   By: Natasha MeadLiviu  Pop M.D.   On: 07/19/2015 12:17   Ct Angio Chest Pe W/cm &/or Wo Cm  07/19/2015  CLINICAL DATA:  Chest pain during dialysis EXAM: CT ANGIOGRAPHY CHEST WITH CONTRAST TECHNIQUE: Multidetector CT imaging of the chest was performed using the standard protocol during bolus administration of intravenous contrast. Multiplanar CT image reconstructions and MIPs were obtained to evaluate the vascular anatomy. CONTRAST:  100 mL Isovue 370 IV COMPARISON:  Chest radiographs dated 07/19/2015 FINDINGS: Cardiovascular: Preferential  opacification of the aorta and pulmonary veins during imaging. As such, evaluation for pulmonary embolism is limited to the main pulmonary arteries. No evidence of pulmonary embolism in the bilateral main pulmonary arteries. Patient cannot be satisfactorily evaluated for lobar, segmental,  or subsegmental pulmonary emboli. No evidence of thoracic aortic aneurysm or dissection. Mild cardiomegaly.  No pericardial effusion. Coronary atherosclerosis in the LAD (series 4/ image 54). Mediastinum/Nodes: 8 mm short axis high right paratracheal node (series 4/ image 27). 9 mm short axis node in the right azygos esophageal recess. No suspicious axillary nodes. Visualized thyroid is unremarkable. Lungs/Pleura: Evaluation of the lung parenchyma is constrained by respiratory motion. Mild mosaic attenuation, suggesting air trapping. No focal consolidation. No suspicious pulmonary nodules. No focal consolidation. No pleural effusion or pneumothorax. Upper Abdomen: Visualized upper abdomen is unremarkable. Musculoskeletal: Visualized osseous structures are within normal limits. Review of the MIP images confirms the above findings. IMPRESSION: Preferential opacification of the aorta and pulmonary veins during imaging. Patient cannot be satisfactory evaluated for lobar, segmental, or subsegmental pulmonary emboli. At the discretion of the referring clinician, a repeat contrast bolus was not administered. No evidence of pulmonary embolism in the bilateral main pulmonary arteries. No evidence of thoracic aortic aneurysm or dissection. No evidence of acute cardiopulmonary disease. Electronically Signed   By: Charline Bills M.D.   On: 07/19/2015 17:16   Nm Pulmonary Perf And Vent  07/19/2015  CLINICAL DATA:  Chest pain and dyspnea. EXAM: NUCLEAR MEDICINE VENTILATION - PERFUSION LUNG SCAN TECHNIQUE: Ventilation images were obtained in multiple projections using inhaled aerosol Tc-81m DTPA. Perfusion images were obtained in multiple projections after intravenous injection of Tc-59m MAA. RADIOPHARMACEUTICALS:  30.8 mCi Technetium-23m DTPA aerosol inhalation and 4.2 mCi Technetium-22m MAA IV COMPARISON:  Radiographs and CT earlier this day. CT was suboptimal contrast injection for evaluation of pulmonary embolus.  FINDINGS: Ventilation: No focal ventilation defect. Mildly heterogeneous ventilation to the upper lobes bilaterally. Perfusion: No wedge shaped peripheral perfusion defects to suggest acute pulmonary embolism. IMPRESSION: Low probability for pulmonary embolus. Electronically Signed   By: Rubye Oaks M.D.   On: 07/19/2015 22:18   US Venous Img Lower Bilateral  07/19/2015  CLINICAL DATA:  Shortness of breath and chest pain.  History of DVT. EXAM: BILATERAL LOWER EXTREMITY VENOUS DOPPLER ULTRASOUND TECHNIQUE: Gray-scale sonography with graded compression, as well as color Doppler and duplex ultrasound were performed to evaluate the lower extremity deep venous systems from the level of the common femoral vein and including the common femoral, femoral, profunda femoral, popliteal and calf veins including the posterior tibial, peroneal and gastrocnemius veins when visible. The superficial great saphenous vein was also interrogated. Spectral Doppler was utilized to evaluate flow at rest and with distal augmentation maneuvers in the common femoral, femoral and popliteal veins. COMPARISON:  February 01, 2014 FINDINGS: RIGHT LOWER EXTREMITY Common Femoral Vein: No evidence of thrombus. Normal compressibility, respiratory phasicity and response to augmentation. Saphenofemoral Junction: No evidence of thrombus. Normal compressibility and flow on color Doppler imaging. Profunda Femoral Vein: No evidence of thrombus. Normal compressibility and flow on color Doppler imaging. Femoral Vein: No evidence of thrombus. Normal compressibility, respiratory phasicity and response to augmentation. Popliteal Vein: No evidence of thrombus. Normal compressibility, respiratory phasicity and response to augmentation. Calf Veins: No evidence of thrombus. Normal compressibility and flow on color Doppler imaging. Superficial Great Saphenous Vein: No evidence of thrombus. Normal compressibility and flow on color Doppler imaging. Venous  Reflux:  None. Other Findings:  None. LEFT LOWER EXTREMITY  Common Femoral Vein: No evidence of thrombus. Normal compressibility, respiratory phasicity and response to augmentation. Saphenofemoral Junction: No evidence of thrombus. Normal compressibility and flow on color Doppler imaging. Profunda Femoral Vein: No evidence of thrombus. Normal compressibility and flow on color Doppler imaging. Femoral Vein: No evidence of thrombus. Normal compressibility, respiratory phasicity and response to augmentation. Popliteal Vein: No evidence of thrombus. Normal compressibility, respiratory phasicity and response to augmentation. Calf Veins: No evidence of thrombus. Normal compressibility and flow on color Doppler imaging. Superficial Great Saphenous Vein: No evidence of thrombus. Normal compressibility and flow on color Doppler imaging. Venous Reflux:  None. Other Findings:  None. IMPRESSION: No evidence of deep venous thrombosis. Electronically Signed   By: Gerome Sam III M.D   On: 07/19/2015 18:28   US Abdomen Limited Ruq  07/19/2015  CLINICAL DATA:  Right upper quadrant abdominal pain for 1 day. Chest pain. EXAM: US ABDOMEN LIMITED - RIGHT UPPER QUADRANT COMPARISON:  None. FINDINGS: Gallbladder: No gallstones seen. There is no gallbladder wall thickening, pericholecystic fluid or other secondary sonographic signs of acute cholecystitis. No sonographic Murphy's sign elicited during the examination, per the sonographer. Common bile duct: Diameter: Normal at 5 mm Liver: No focal lesion identified. Within normal limits in parenchymal echogenicity. IMPRESSION: Normal right upper quadrant ultrasound. Electronically Signed   By: Bary Richard M.D.   On: 07/19/2015 19:17     ASSESSMENT AND PLAN:   * Diabetes mellitus, uncontrolled Very high blood sugars at 850. He has received significantly less dose of lantus over last 24 hrs. He received a STAT dose of lantus 30 units and IV insulin 15 units. This was followed  by Multiple novolog doses. BMP shows Normal AG and Bicarb. Will continue inpatient care. Endocrine consulted Restart home Lantus at 60 units BID.  * ESRD Patient had HD yesterday. Due again tomorrow  * Gastroparesis Likely cause of his abd pain. CT/US have been normal.  All the records are reviewed and case discussed with Care Management/Social Workerr. Management plans discussed with the patient, family and they are in agreement.  CODE STATUS: FULL  DVT Prophylaxis: SCDs  TOTAL CRITICAL CARE TIME TAKING CARE OF THIS PATIENT: 35 minutes.   POSSIBLE D/C IN 1-2 DAYS, DEPENDING ON CLINICAL CONDITION.  Milagros Loll R M.D on 07/20/2015 at 6:11 PM  Between 7am to 6pm - Pager - [phone]  After 6pm go to [email or web address] - password [email or web address]  [facility]  Office  [phone]  CC: Primary care physician; [name], MD  Note: This dictation was prepared with Dragon dictation along with smaller phrase technology. Any transcriptional errors that result from this process are unintentional.

## 2015-07-20 NOTE — Plan of Care (Signed)
Problem: Tissue Perfusion: Goal: Risk factors for ineffective tissue perfusion will decrease Outcome: Progressing SQ Heparin     

## 2015-07-20 NOTE — Progress Notes (Signed)
BS reading high on glucometer, stat Glucose ordered for lab draw.

## 2015-07-21 LAB — RENAL FUNCTION PANEL
ALBUMIN: 3.1 g/dL — AB (ref 3.5–5.0)
Anion gap: 11 (ref 5–15)
BUN: 64 mg/dL — AB (ref 6–20)
CO2: 26 mmol/L (ref 22–32)
CREATININE: 5.27 mg/dL — AB (ref 0.61–1.24)
Calcium: 8.3 mg/dL — ABNORMAL LOW (ref 8.9–10.3)
Chloride: 95 mmol/L — ABNORMAL LOW (ref 101–111)
GFR, EST AFRICAN AMERICAN: 14 mL/min — AB (ref >60)
GFR, EST NON AFRICAN AMERICAN: 12 mL/min — AB (ref >60)
Glucose, Bld: 206 mg/dL — ABNORMAL HIGH (ref 65–99)
Phosphorus: 4.1 mg/dL (ref 2.5–4.6)
Potassium: 3.6 mmol/L (ref 3.5–5.1)
SODIUM: 132 mmol/L — AB (ref 135–145)

## 2015-07-21 LAB — GLUCOSE, CAPILLARY
Glucose-Capillary: 268 mg/dL — ABNORMAL HIGH (ref 65–99)
Glucose-Capillary: 63 mg/dL — ABNORMAL LOW (ref 65–99)
Glucose-Capillary: 116 mg/dL — ABNORMAL HIGH (ref 65–99)

## 2015-07-21 MED ORDER — MYCOPHENOLATE MOFETIL 250 MG PO CAPS
500.0000 mg | ORAL_CAPSULE | Freq: Every day | ORAL | Status: DC
  Administered 2015-07-21: 500 mg via ORAL
  Filled 2015-07-21: qty 2

## 2015-07-21 MED ORDER — AMLODIPINE BESYLATE 5 MG PO TABS: 5.0000 mg | ORAL_TABLET | Freq: Every day | ORAL | Status: DC

## 2015-07-21 MED ORDER — POLYETHYLENE GLYCOL 3350 17 G PO PACK: 17.0000 g | PACK | Freq: Every day | ORAL | Status: DC

## 2015-07-21 NOTE — Progress Notes (Signed)
Pre Dialysis 

## 2015-07-21 NOTE — Progress Notes (Signed)
Inpatient Diabetes Program Recommendations  AACE/ADA: New Consensus Statement on Inpatient Glycemic Control (2015)  Target Ranges:  Prepandial:   less than 140 mg/dL      Peak postprandial:   less than 180 mg/dL (1-2 hours)      Critically ill patients:  140 - 180 mg/dL   Lab Results  Component Value Date   GLUCAP 268* 07/21/2015   HGBA1C 16.8* 07/20/2015    Review of Glycemic Control:  Results for Russell BirkenheadSILVER, Demba B (MRN [id number]) as of 07/21/2015 11:10  Ref. Range 07/20/2015 14:23 07/20/2015 16:55 07/20/2015 21:02 07/20/2015 22:50 07/21/2015 07:25  Glucose-Capillary Latest Ref Range: 65-99 mg/dL 409546 (HH) 811399 (H) 914177 (H) 205 (H) 268 (H)   Inpatient Diabetes Program Recommendations:  Blood sugars improved this AM however still not within goal.  Note that endocrinology consult in place.  Will follow. A1C indicates very poor glycemic control.  Thanks, Beryl MeagerJenny Crosby Bevan, RN, BC-ADM Inpatient Diabetes Coordinator Pager [phone]

## 2015-07-21 NOTE — Progress Notes (Signed)
Post dialysis 

## 2015-07-21 NOTE — Progress Notes (Signed)
Patient discharged home as ordered,instructions explained and well understood,follow up appointment made,vital signs within normal limits,escorted by staff member and family via wheel chair.

## 2015-07-21 NOTE — Progress Notes (Signed)
Dialysis complete

## 2015-07-21 NOTE — Progress Notes (Signed)
Dialysis started 

## 2015-07-21 NOTE — Progress Notes (Signed)
Subjective:  Patient seen during dialysis Tolerating well    HEMODIALYSIS FLOWSHEET:  Blood Flow Rate (mL/min): 400 mL/min Arterial Pressure (mmHg): -200 mmHg Venous Pressure (mmHg): 180 mmHg Transmembrane Pressure (mmHg): 40 mmHg Ultrafiltration Rate (mL/min): 570 mL/min Dialysate Flow Rate (mL/min): 600 ml/min Conductivity: Machine : 14 Conductivity: Machine : 14 Dialysis Fluid Bolus: Normal Saline Bolus Amount (mL): 250 mL Dialysate Change: Other (comment) (3K) Intra-Hemodialysis Comments: 2000. Tx complete  No acute c/o States that there is slight chest discomfort   Objective:  Vital signs in last 24 hours:  Temp:  [97.4 F (36.3 C)-98.1 F (36.7 C)] 97.6 F (36.4 C) (07/19 1254) Pulse Rate:  [66-84] 72 (07/19 1254) Resp:  [16-20] 18 (07/19 1254) BP: (98-128)/(61-88) 98/65 mmHg (07/19 1254) SpO2:  [90 %-100 %] 99 % (07/19 1254) Weight:  [118.525 kg (261 lb 4.8 oz)-120.7 kg (266 lb 1.5 oz)] 118.525 kg (261 lb 4.8 oz) (07/19 1254)  Weight change: 5.942 kg (13 lb 1.6 oz) Filed Weights   07/21/15 0851 07/21/15 1231 07/21/15 1254  Weight: 120.7 kg (266 lb 1.5 oz) 119.2 kg (262 lb 12.6 oz) 118.525 kg (261 lb 4.8 oz)    Intake/Output:    Intake/Output Summary (Last 24 hours) at 07/21/15 1315 Last data filed at 07/21/15 1231  Gross per 24 hour  Intake    480 ml  Output   1500 ml  Net  -1020 ml     Physical Exam: General: NAD, laying in bed  HEENT Anicteric, moist oral mucus membranes  Neck Supple, no masses  Pulm/lungs Clear b/l, normal effort  CVS/Heart Regular, no rub   Abdomen:  Soft, obese, non tender, non distended  Extremities: No leg edema  Neurologic: Alert, oreinted  Skin: No acute rashes  Access: AVF       Basic Metabolic Panel:   Recent Labs Lab 07/19/15 1159 07/20/15 0811 07/20/15 1051 07/20/15 1535 07/21/15 0500  NA 133*  --   --  125* 132*  K 3.9  --   --  4.9 3.6  CL 94*  --   --  88* 95*  CO2 29  --   --  22 26  GLUCOSE  293* 849* 684* 446* 206*  BUN 27*  --   --  70* 64*  CREATININE 3.30*  --   --  7.15* 5.27*  CALCIUM 8.7*  --   --  8.9 8.3*  PHOS  --   --   --   --  4.1     CBC:  Recent Labs Lab 07/19/15 1159  WBC 6.2  HGB 11.7*  HCT 33.9*  MCV 77.8*  PLT 311      Microbiology:  No results found for this or any previous visit (from the past 720 hour(s)).  Coagulation Studies: No results for input(s): LABPROT, INR in the last 72 hours.  Urinalysis:  Recent Labs  07/19/15 0011  COLORURINE YELLOW*  LABSPEC 1.026  PHURINE 5.0  GLUCOSEU >500*  HGBUR 1+*  BILIRUBINUR NEGATIVE  KETONESUR NEGATIVE  PROTEINUR >500*  NITRITE NEGATIVE  LEUKOCYTESUR NEGATIVE      Imaging: Ct Angio Chest Pe W/cm &/or Wo Cm  07/19/2015  CLINICAL DATA:  Chest pain during dialysis EXAM: CT ANGIOGRAPHY CHEST WITH CONTRAST TECHNIQUE: Multidetector CT imaging of the chest was performed using the standard protocol during bolus administration of intravenous contrast. Multiplanar CT image reconstructions and MIPs were obtained to evaluate the vascular anatomy. CONTRAST:  100 mL Isovue 370 IV COMPARISON:  Chest radiographs  dated 07/19/2015 FINDINGS: Cardiovascular: Preferential opacification of the aorta and pulmonary veins during imaging. As such, evaluation for pulmonary embolism is limited to the main pulmonary arteries. No evidence of pulmonary embolism in the bilateral main pulmonary arteries. Patient cannot be satisfactorily evaluated for lobar, segmental, or subsegmental pulmonary emboli. No evidence of thoracic aortic aneurysm or dissection. Mild cardiomegaly.  No pericardial effusion. Coronary atherosclerosis in the LAD (series 4/ image 54). Mediastinum/Nodes: 8 mm short axis high right paratracheal node (series 4/ image 27). 9 mm short axis node in the right azygos esophageal recess. No suspicious axillary nodes. Visualized thyroid is unremarkable. Lungs/Pleura: Evaluation of the lung parenchyma is constrained  by respiratory motion. Mild mosaic attenuation, suggesting air trapping. No focal consolidation. No suspicious pulmonary nodules. No focal consolidation. No pleural effusion or pneumothorax. Upper Abdomen: Visualized upper abdomen is unremarkable. Musculoskeletal: Visualized osseous structures are within normal limits. Review of the MIP images confirms the above findings. IMPRESSION: Preferential opacification of the aorta and pulmonary veins during imaging. Patient cannot be satisfactory evaluated for lobar, segmental, or subsegmental pulmonary emboli. At the discretion of the referring clinician, a repeat contrast bolus was not administered. No evidence of pulmonary embolism in the bilateral main pulmonary arteries. No evidence of thoracic aortic aneurysm or dissection. No evidence of acute cardiopulmonary disease. Electronically Signed   By: Charline Bills M.D.   On: 07/19/2015 17:16   Nm Pulmonary Perf And Vent  07/19/2015  CLINICAL DATA:  Chest pain and dyspnea. EXAM: NUCLEAR MEDICINE VENTILATION - PERFUSION LUNG SCAN TECHNIQUE: Ventilation images were obtained in multiple projections using inhaled aerosol Tc-37m DTPA. Perfusion images were obtained in multiple projections after intravenous injection of Tc-90m MAA. RADIOPHARMACEUTICALS:  30.8 mCi Technetium-4m DTPA aerosol inhalation and 4.2 mCi Technetium-79m MAA IV COMPARISON:  Radiographs and CT earlier this day. CT was suboptimal contrast injection for evaluation of pulmonary embolus. FINDINGS: Ventilation: No focal ventilation defect. Mildly heterogeneous ventilation to the upper lobes bilaterally. Perfusion: No wedge shaped peripheral perfusion defects to suggest acute pulmonary embolism. IMPRESSION: Low probability for pulmonary embolus. Electronically Signed   By: Rubye Oaks M.D.   On: 07/19/2015 22:18   US Venous Img Lower Bilateral  07/19/2015  CLINICAL DATA:  Shortness of breath and chest pain.  History of DVT. EXAM: BILATERAL LOWER  EXTREMITY VENOUS DOPPLER ULTRASOUND TECHNIQUE: Gray-scale sonography with graded compression, as well as color Doppler and duplex ultrasound were performed to evaluate the lower extremity deep venous systems from the level of the common femoral vein and including the common femoral, femoral, profunda femoral, popliteal and calf veins including the posterior tibial, peroneal and gastrocnemius veins when visible. The superficial great saphenous vein was also interrogated. Spectral Doppler was utilized to evaluate flow at rest and with distal augmentation maneuvers in the common femoral, femoral and popliteal veins. COMPARISON:  February 01, 2014 FINDINGS: RIGHT LOWER EXTREMITY Common Femoral Vein: No evidence of thrombus. Normal compressibility, respiratory phasicity and response to augmentation. Saphenofemoral Junction: No evidence of thrombus. Normal compressibility and flow on color Doppler imaging. Profunda Femoral Vein: No evidence of thrombus. Normal compressibility and flow on color Doppler imaging. Femoral Vein: No evidence of thrombus. Normal compressibility, respiratory phasicity and response to augmentation. Popliteal Vein: No evidence of thrombus. Normal compressibility, respiratory phasicity and response to augmentation. Calf Veins: No evidence of thrombus. Normal compressibility and flow on color Doppler imaging. Superficial Great Saphenous Vein: No evidence of thrombus. Normal compressibility and flow on color Doppler imaging. Venous Reflux:  None. Other Findings:  None. LEFT LOWER EXTREMITY Common Femoral Vein: No evidence of thrombus. Normal compressibility, respiratory phasicity and response to augmentation. Saphenofemoral Junction: No evidence of thrombus. Normal compressibility and flow on color Doppler imaging. Profunda Femoral Vein: No evidence of thrombus. Normal compressibility and flow on color Doppler imaging. Femoral Vein: No evidence of thrombus. Normal compressibility, respiratory phasicity  and response to augmentation. Popliteal Vein: No evidence of thrombus. Normal compressibility, respiratory phasicity and response to augmentation. Calf Veins: No evidence of thrombus. Normal compressibility and flow on color Doppler imaging. Superficial Great Saphenous Vein: No evidence of thrombus. Normal compressibility and flow on color Doppler imaging. Venous Reflux:  None. Other Findings:  None. IMPRESSION: No evidence of deep venous thrombosis. Electronically Signed   By: Gerome Samavid  Williams III M.D   On: 07/19/2015 18:28   [place] Abdomen Limited Ruq  07/19/2015  CLINICAL DATA:  Right upper quadrant abdominal pain for 1 day. Chest pain. EXAM: US ABDOMEN LIMITED - RIGHT UPPER QUADRANT COMPARISON:  None. FINDINGS: Gallbladder: No gallstones seen. There is no gallbladder wall thickening, pericholecystic fluid or other secondary sonographic signs of acute cholecystitis. No sonographic Murphy's sign elicited during the examination, per the sonographer. Common bile duct: Diameter: Normal at 5 mm Liver: No focal lesion identified. Within normal limits in parenchymal echogenicity. IMPRESSION: Normal right upper quadrant ultrasound. Electronically Signed   By: Bary RichardStan  Maynard M.D.   On: 07/19/2015 19:17     Medications:     . amLODipine  10 mg Oral Daily  . buPROPion  100 mg Oral Daily  . calcium acetate  1,334 mg Oral TID WC  . citalopram  60 mg Oral QHS  . clonazePAM  2 mg Oral QHS  . cloNIDine  0.1 mg Oral BID  . cyclobenzaprine  10 mg Oral QHS  . docusate sodium  100 mg Oral BID  . gabapentin  200 mg Oral TID  . heparin  5,000 Units Subcutaneous Q8H  . insulin aspart  0-20 Units Subcutaneous TID WC  . insulin aspart  0-5 Units Subcutaneous QHS  . insulin aspart  20 Units Subcutaneous TID WC  . insulin glargine  60 Units Subcutaneous BID  . levothyroxine  50 mcg Oral QAC breakfast  . metoprolol succinate  100 mg Oral Daily  . mycophenolate  500 mg Oral Daily  . polyethylene glycol  17 g Oral Daily   . sodium chloride flush  3 mL Intravenous Q12H  . terbinafine  250 mg Oral Daily   acetaminophen **OR** acetaminophen, albuterol, HYDROcodone-acetaminophen, HYDROmorphone, hydrOXYzine, metoCLOPramide, ondansetron **OR** ondansetron (ZOFRAN) IV, promethazine, sucralfate  Assessment/ Plan:  45 y.o. caucasian male with medical problems of End-stage renal disease on dialysis for the past 6 months, poorly controlled diabetes with complications of neuropathy, retinopathy, gastropathy, right leg DVT in January 2016, hypothyroidism, asthma, arthritis, chronic back pain requiring narcotics, who [facility] for evaluation of near syncope and chest pain.   1. End-stage renal disease. [facility]F schedule. Followed by Dr. Kathrene Bongo[place] - Patient seen during dialysis Tolerating well   2. Anemia of chronic kidney disease Hemoglobin acceptable at 11.7  3. Secondary hyperparathyroidism We will monitor phosphorus levels during this hospital stay Renal diet Continue PhosLo  4. Diabetes with complications of neuropathy, nephropathy Needs better control HbA1c 16.8 %     LOS: 1 Jendayi Berling 7/19/20171:15 PM

## 2015-07-22 NOTE — Discharge Summary (Signed)
[facility] - [place] at [facility]   PATIENT NAME: Russell [name]    MR#:  [id number]  DATE OF BIRTH:  1970/09/14  DATE OF ADMISSION:  07/19/2015 ADMITTING PHYSICIAN: Arnaldo Natal, MD  DATE OF DISCHARGE: 07/21/2015  5:25 PM  PRIMARY CARE PHYSICIAN: [name], MD   ADMISSION DIAGNOSIS:  Wheezing [R06.2] Dyspnea [R06.00] Atypical chest pain [R07.89] Abdominal pain [R10.9] Chest pain with moderate risk for cardiac etiology [R07.9]  DISCHARGE DIAGNOSIS:  Principal Problem:   Angina pectoris (HCC) Active Problems:   Hypothyroidism (acquired)   Uncontrolled diabetes mellitus type 2 with peripheral artery disease (HCC)   Essential hypertension, benign   ESRD on dialysis (HCC)   Chest pain   Hyperglycemia   SECONDARY DIAGNOSIS:   Past Medical History  Diagnosis Date  . Hypertension   . DVT (deep venous thrombosis), H/o 01/2014-on Xarelto 03/24/2014    LLE  . Secondary DM with DKA-AG=16, BIcarb Nl 03/24/2014  . Age-related macular degeneration, wet, both eyes (HCC)     "I'm getting Alia treatments" (12/30/2014)  . Hypothyroidism   . Asthma   . Pneumonia 11/2013  . Daily headache   . Arthritis     "hands" (12/30/2014)  . Anxiety   . Depression   . Type 2 diabetes mellitus with diabetic nephropathy (HCC)   . Nephrotic syndrome 05/18/2014  . 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 Renal insufficiency      ADMITTING HISTORY  HPI: The patient with past medical history of end-stage renal disease on dialysis presents to the emergency department complaining of a near syncopal episode and chest pain. The patient had been at dialysis and was noted to have hypotension. The patient recalls feeling clammy but completed his treatment. He admits that he had some vague mild to moderate chest pain while undergoing treatment. However, after dialysis was completed he felt faint but did not lose consciousness. Oxygen was placed via nasal  cannula and the patient transported to the emergency department. Since that time oxygen saturations have been normal. VQ scan confirmed no pulmonary embolus. Laboratory evaluation significant for negative cardiac biomarkers. The patient received multiple doses of pain medication but continued to complain of pain in his lower right chest that occasionally radiates substernally. He denies shortness of breath, nausea or vomiting. Due to ongoing chest pain the emergency department staff called for admission.  HOSPITAL COURSE:   * Diabetes mellitus, uncontrolled We have blood sugars on first day of admission. Patient had missed his insulin dose of Lantus 60 units during his transitioned from home to emergency room and to the floor. This was restarted along with pre-meal insulin. Blood sugars are improved and close to normal. He does have history of labile blood sugars. Continue home dose of Lantus and NovoLog.  * ESRD Patient had dialysis done during the hospital stay.  * Gastroparesis Likely cause of his abd pain. CT/US have been normal.  Patient is stable for discharge home to follow-up with his primary care physician and endocrinologist. Continue dialysis as before.  Prescriptions for PPI and MiraLAX given.  CONSULTS OBTAINED:  Treatment Team:  Newt Minion, MD  DRUG ALLERGIES:   Allergies  Allergen Reactions  . Ibuprofen Other (See Comments)    MD told him not to take due to kidney disease.  . Nsaids Other (See Comments)    Told to avoid all nsaids due to kidney disease-takes acetaminophen as needed    DISCHARGE MEDICATIONS:  Discharge Medication List as of 07/21/2015  4:05 PM    START taking these medications   Details  omeprazole (PRILOSEC) 40 MG capsule Take 1 capsule (40 mg total) by mouth 2 (two) times daily., Starting 07/20/2015, Until Discontinued, Print    polyethylene glycol (MIRALAX) packet Take 17 g by mouth daily., Starting 07/20/2015, Until Discontinued,  Print      CONTINUE these medications which have NOT CHANGED   Details  acetaminophen (TYLENOL) 500 MG tablet Take 1 tablet (500 mg total) by mouth every 6 (six) hours as needed for moderate pain., Starting 09/29/2014, Until Discontinued, Normal    albuterol (PROVENTIL HFA;VENTOLIN HFA) 108 (90 Base) MCG/ACT inhaler Inhale 1 puff into the lungs every 6 (six) hours as needed for wheezing or shortness of breath., Starting 03/11/2015, Until Discontinued, Normal    albuterol-ipratropium (COMBIVENT) 18-103 MCG/ACT inhaler Inhale 2 puffs into the lungs every 4 (four) hours., Starting 09/03/2014, Until Discontinued, Normal    amLODipine (NORVASC) 10 MG tablet Take 1 tablet (10 mg total) by mouth daily., Starting 05/22/2015, Until Discontinued, Normal    buPROPion (WELLBUTRIN) 100 MG tablet Take 100 mg by mouth daily., Until Discontinued, Historical Med    calcium acetate (PHOSLO) 667 MG capsule Take 2 capsules (1,334 mg total) by mouth 3 (three) times daily with meals., Starting 01/08/2015, Until Discontinued, Normal    citalopram (CELEXA) 40 MG tablet Take 1.5 tablets (60 mg total) by mouth at bedtime., Starting 05/22/2015, Until Discontinued, Normal    clonazePAM (KLONOPIN) 2 MG tablet Take 2 mg by mouth at bedtime., Until Discontinued, Historical Med    cloNIDine (CATAPRES) 0.1 MG tablet Take 1 tablet (0.1 mg total) by mouth 2 (two) times daily., Starting 01/08/2015, Until Discontinued, Normal    cyclobenzaprine (FLEXERIL) 10 MG tablet Take 1 tablet (10 mg total) by mouth at bedtime., Starting 05/22/2015, Until Discontinued, Normal    gabapentin (NEURONTIN) 100 MG capsule Take 2 capsules (200 mg total) by mouth 3 (three) times daily., Starting 05/22/2015, Until Discontinued, Normal    HYDROmorphone (DILAUDID) 2 MG tablet Take 1 tablet (2 mg total) by mouth every 8 (eight) hours as needed for severe pain., Starting 05/22/2015, Until Discontinued, Print    hydrOXYzine (ATARAX/VISTARIL) 25 MG tablet Take 25  mg by mouth 2 (two) times daily as needed for anxiety. , Until Discontinued, Historical Med    insulin glargine (LANTUS) 100 UNIT/ML injection Inject 60 Units into the skin 2 (two) times daily., Until Discontinued, Historical Med    Insulin Lispro (HUMALOG KWIKPEN) 200 UNIT/ML SOPN Inject into the skin 3 (three) times daily as needed. For high blood sugar per sliding scale, Until Discontinued, Historical Med    levothyroxine (SYNTHROID, LEVOTHROID) 50 MCG tablet Take 1 tablet (50 mcg total) by mouth daily., Starting 05/22/2015, Until Discontinued, Normal    metoCLOPramide (REGLAN) 5 MG tablet Take 1 tablet (5 mg total) by mouth every 8 (eight) hours as needed for nausea., Starting 05/22/2015, Until Discontinued, Normal    metoprolol succinate (TOPROL-XL) 100 MG 24 hr tablet Take 1 tablet (100 mg total) by mouth daily., Starting 05/22/2015, Until Discontinued, Normal    mycophenolate (CELLCEPT) 500 MG tablet Take 500 mg by mouth daily., Starting 12/28/2014, Until Discontinued, Historical Med    promethazine (PHENERGAN) 25 MG tablet Take 1 tablet (25 mg total) by mouth every 8 (eight) hours as needed for nausea., Starting 05/22/2015, Until Discontinued, Normal    sucralfate (CARAFATE) 1 GM/10ML suspension Take 1 g by mouth daily as needed (for throat  irritation). , Until Discontinued, Historical Med    terbinafine (LAMISIL) 250 MG tablet Take 1 tablet (250 mg total) by mouth daily., Starting 05/22/2015, Until Discontinued, Normal      STOP taking these medications     amoxicillin-clavulanate (AUGMENTIN) 875-125 MG tablet         Today   VITAL SIGNS:  Blood pressure 98/65, pulse 72, temperature 97.6 F (36.4 C), temperature source Oral, resp. rate 18, height 6\' 2"  (1.88 m), weight 118.525 kg (261 lb 4.8 oz), SpO2 99 %.  I/O:   Intake/Output Summary (Last 24 hours) at 07/22/15 1224 Last data filed at 07/21/15 1300  Gross per 24 hour  Intake      0 ml  Output   1500 ml  Net  -1500 ml     PHYSICAL EXAMINATION:  Physical Exam  GENERAL:  45 y.o.-year-old patient lying in the bed with no acute distress. Obese LUNGS: Normal breath sounds bilaterally, no wheezing, rales,rhonchi or crepitation. No use of accessory muscles of respiration.  CARDIOVASCULAR: S1, S2 normal. No murmurs, rubs, or gallops.  ABDOMEN: Soft, non-tender, non-distended. Bowel sounds present. No organomegaly or mass.  NEUROLOGIC: Moves all 4 extremities. PSYCHIATRIC: The patient is alert and oriented x 3.  SKIN: No obvious rash, lesion, or ulcer.   DATA REVIEW:   CBC  Recent Labs Lab 07/19/15 1159  WBC 6.2  HGB 11.7*  HCT 33.9*  PLT 311    Chemistries   Recent Labs Lab 07/19/15 1159  07/21/15 0500  NA 133*  < > 132*  K 3.9  < > 3.6  CL 94*  < > 95*  CO2 29  < > 26  GLUCOSE 293*  < > 206*  BUN 27*  < > 64*  CREATININE 3.30*  < > 5.27*  CALCIUM 8.7*  < > 8.3*  AST 17  --   --   ALT 17  --   --   ALKPHOS 100  --   --   BILITOT 0.8  --   --   < > = values in this interval not displayed.  Cardiac Enzymes  Recent Labs Lab 07/20/15 0427  TROPONINI <0.03    Microbiology Results  Results for orders placed or performed during the hospital encounter of 02/13/15  MRSA PCR Screening     Status: None   Collection Time: 02/14/15  1:01 AM  Result Value Ref Range Status   MRSA by PCR NEGATIVE NEGATIVE Final    Comment:        The GeneXpert MRSA Assay (FDA approved for NASAL specimens only), is one component of a comprehensive MRSA colonization surveillance program. It is not intended to diagnose MRSA infection nor to guide or monitor treatment for MRSA infections.     RADIOLOGY:  No results found.  Follow up with PCP in 1 week.  Management plans discussed with the patient, family and they are in agreement.  CODE STATUS:  Code Status History    Date Active Date Inactive Code Status Order ID Comments User Context   07/20/2015  1:53 AM 07/21/2015  8:25 PM Full Code  [id number]  Arnaldo NatalMichael S Diamond, MD ED   02/13/2015 10:55 PM 02/16/2015  5:06 PM Full Code [id number]  Hillary BowJared M Gardner, DO ED   12/30/2014  2:29 PM 01/08/2015  9:41 PM Full Code [id number]  Renae FickleMackenzie Short, MD Inpatient   11/17/2014 10:55 PM 11/21/2014  1:43 PM Full Code [id number]  Myra RudeJeremy E Schmitz, MD Inpatient   09/30/2014  7:22 PM 10/03/2014  6:14 PM Full Code [id number]  Meredith Pel, NP Inpatient   09/21/2014  5:09 PM 09/29/2014  9:33 PM Full Code [id number]  Dorothea Ogle, MD Inpatient   04/05/2014  5:17 PM 04/06/2014  8:17 PM Full Code [id number]  Starleen Arms, MD Inpatient   03/24/2014  4:02 PM 03/29/2014  3:33 PM Full Code [id number]  Rhetta Mura, MD Inpatient      TOTAL TIME TAKING CARE OF THIS PATIENT ON DAY OF DISCHARGE: more than 30 minutes.   Milagros Loll R M.D on 07/22/2015 at 12:24 PM  Between 7am to 6pm - Pager - [phone]  After 6pm go to [email or web address] - password [email or web address]  [facility]  Office  [phone]  CC: Primary care physician; [name], MD  Note: This dictation was prepared with Dragon dictation along with smaller phrase technology. Any transcriptional errors that result from this process are unintentional.

## 2015-08-02 ENCOUNTER — Encounter: Payer: Self-pay | Admitting: Family Medicine

## 2015-08-02 ENCOUNTER — Other Ambulatory Visit: Payer: Self-pay | Admitting: Internal Medicine

## 2015-08-02 LAB — HM DIABETES EYE EXAM

## 2015-08-04 DIAGNOSIS — D631 Anemia in chronic kidney disease: Secondary | ICD-10-CM | POA: Diagnosis not present

## 2015-08-04 DIAGNOSIS — N186 End stage renal disease: Secondary | ICD-10-CM | POA: Diagnosis not present

## 2015-08-04 DIAGNOSIS — E1121 Type 2 diabetes mellitus with diabetic nephropathy: Secondary | ICD-10-CM | POA: Diagnosis not present

## 2015-08-04 DIAGNOSIS — E211 Secondary hyperparathyroidism, not elsewhere classified: Secondary | ICD-10-CM | POA: Diagnosis not present

## 2015-08-05 ENCOUNTER — Inpatient Hospital Stay: Payer: Managed Care, Other (non HMO) | Admitting: Family Medicine

## 2015-08-06 DIAGNOSIS — D631 Anemia in chronic kidney disease: Secondary | ICD-10-CM | POA: Diagnosis not present

## 2015-08-06 DIAGNOSIS — E1121 Type 2 diabetes mellitus with diabetic nephropathy: Secondary | ICD-10-CM | POA: Diagnosis not present

## 2015-08-06 DIAGNOSIS — N186 End stage renal disease: Secondary | ICD-10-CM | POA: Diagnosis not present

## 2015-08-06 DIAGNOSIS — E211 Secondary hyperparathyroidism, not elsewhere classified: Secondary | ICD-10-CM | POA: Diagnosis not present

## 2015-08-09 DIAGNOSIS — E1121 Type 2 diabetes mellitus with diabetic nephropathy: Secondary | ICD-10-CM | POA: Diagnosis not present

## 2015-08-09 DIAGNOSIS — E211 Secondary hyperparathyroidism, not elsewhere classified: Secondary | ICD-10-CM | POA: Diagnosis not present

## 2015-08-09 DIAGNOSIS — D631 Anemia in chronic kidney disease: Secondary | ICD-10-CM | POA: Diagnosis not present

## 2015-08-09 DIAGNOSIS — N186 End stage renal disease: Secondary | ICD-10-CM | POA: Diagnosis not present

## 2015-08-11 DIAGNOSIS — N186 End stage renal disease: Secondary | ICD-10-CM | POA: Diagnosis not present

## 2015-08-11 DIAGNOSIS — E1121 Type 2 diabetes mellitus with diabetic nephropathy: Secondary | ICD-10-CM | POA: Diagnosis not present

## 2015-08-11 DIAGNOSIS — D631 Anemia in chronic kidney disease: Secondary | ICD-10-CM | POA: Diagnosis not present

## 2015-08-11 DIAGNOSIS — E211 Secondary hyperparathyroidism, not elsewhere classified: Secondary | ICD-10-CM | POA: Diagnosis not present

## 2015-08-13 DIAGNOSIS — D631 Anemia in chronic kidney disease: Secondary | ICD-10-CM | POA: Diagnosis not present

## 2015-08-13 DIAGNOSIS — E1121 Type 2 diabetes mellitus with diabetic nephropathy: Secondary | ICD-10-CM | POA: Diagnosis not present

## 2015-08-13 DIAGNOSIS — E211 Secondary hyperparathyroidism, not elsewhere classified: Secondary | ICD-10-CM | POA: Diagnosis not present

## 2015-08-13 DIAGNOSIS — N186 End stage renal disease: Secondary | ICD-10-CM | POA: Diagnosis not present

## 2015-08-16 DIAGNOSIS — E211 Secondary hyperparathyroidism, not elsewhere classified: Secondary | ICD-10-CM | POA: Diagnosis not present

## 2015-08-16 DIAGNOSIS — D631 Anemia in chronic kidney disease: Secondary | ICD-10-CM | POA: Diagnosis not present

## 2015-08-16 DIAGNOSIS — N186 End stage renal disease: Secondary | ICD-10-CM | POA: Diagnosis not present

## 2015-08-16 DIAGNOSIS — E1121 Type 2 diabetes mellitus with diabetic nephropathy: Secondary | ICD-10-CM | POA: Diagnosis not present

## 2015-08-18 DIAGNOSIS — E211 Secondary hyperparathyroidism, not elsewhere classified: Secondary | ICD-10-CM | POA: Diagnosis not present

## 2015-08-18 DIAGNOSIS — E1121 Type 2 diabetes mellitus with diabetic nephropathy: Secondary | ICD-10-CM | POA: Diagnosis not present

## 2015-08-18 DIAGNOSIS — D631 Anemia in chronic kidney disease: Secondary | ICD-10-CM | POA: Diagnosis not present

## 2015-08-18 DIAGNOSIS — N186 End stage renal disease: Secondary | ICD-10-CM | POA: Diagnosis not present

## 2015-08-21 DIAGNOSIS — D631 Anemia in chronic kidney disease: Secondary | ICD-10-CM | POA: Diagnosis not present

## 2015-08-21 DIAGNOSIS — N186 End stage renal disease: Secondary | ICD-10-CM | POA: Diagnosis not present

## 2015-08-21 DIAGNOSIS — E211 Secondary hyperparathyroidism, not elsewhere classified: Secondary | ICD-10-CM | POA: Diagnosis not present

## 2015-08-21 DIAGNOSIS — E1121 Type 2 diabetes mellitus with diabetic nephropathy: Secondary | ICD-10-CM | POA: Diagnosis not present

## 2015-08-23 DIAGNOSIS — D631 Anemia in chronic kidney disease: Secondary | ICD-10-CM | POA: Diagnosis not present

## 2015-08-23 DIAGNOSIS — E1121 Type 2 diabetes mellitus with diabetic nephropathy: Secondary | ICD-10-CM | POA: Diagnosis not present

## 2015-08-23 DIAGNOSIS — N186 End stage renal disease: Secondary | ICD-10-CM | POA: Diagnosis not present

## 2015-08-23 DIAGNOSIS — E211 Secondary hyperparathyroidism, not elsewhere classified: Secondary | ICD-10-CM | POA: Diagnosis not present

## 2015-08-25 DIAGNOSIS — D631 Anemia in chronic kidney disease: Secondary | ICD-10-CM | POA: Diagnosis not present

## 2015-08-25 DIAGNOSIS — N186 End stage renal disease: Secondary | ICD-10-CM | POA: Diagnosis not present

## 2015-08-25 DIAGNOSIS — E211 Secondary hyperparathyroidism, not elsewhere classified: Secondary | ICD-10-CM | POA: Diagnosis not present

## 2015-08-25 DIAGNOSIS — E1121 Type 2 diabetes mellitus with diabetic nephropathy: Secondary | ICD-10-CM | POA: Diagnosis not present

## 2015-08-27 DIAGNOSIS — D631 Anemia in chronic kidney disease: Secondary | ICD-10-CM | POA: Diagnosis not present

## 2015-08-27 DIAGNOSIS — E1121 Type 2 diabetes mellitus with diabetic nephropathy: Secondary | ICD-10-CM | POA: Diagnosis not present

## 2015-08-27 DIAGNOSIS — E211 Secondary hyperparathyroidism, not elsewhere classified: Secondary | ICD-10-CM | POA: Diagnosis not present

## 2015-08-27 DIAGNOSIS — N186 End stage renal disease: Secondary | ICD-10-CM | POA: Diagnosis not present

## 2015-08-30 DIAGNOSIS — N186 End stage renal disease: Secondary | ICD-10-CM | POA: Diagnosis not present

## 2015-08-30 DIAGNOSIS — D631 Anemia in chronic kidney disease: Secondary | ICD-10-CM | POA: Diagnosis not present

## 2015-08-30 DIAGNOSIS — E1121 Type 2 diabetes mellitus with diabetic nephropathy: Secondary | ICD-10-CM | POA: Diagnosis not present

## 2015-08-30 DIAGNOSIS — E211 Secondary hyperparathyroidism, not elsewhere classified: Secondary | ICD-10-CM | POA: Diagnosis not present

## 2015-09-01 DIAGNOSIS — N186 End stage renal disease: Secondary | ICD-10-CM | POA: Diagnosis not present

## 2015-09-01 DIAGNOSIS — D631 Anemia in chronic kidney disease: Secondary | ICD-10-CM | POA: Diagnosis not present

## 2015-09-01 DIAGNOSIS — E1121 Type 2 diabetes mellitus with diabetic nephropathy: Secondary | ICD-10-CM | POA: Diagnosis not present

## 2015-09-01 DIAGNOSIS — E211 Secondary hyperparathyroidism, not elsewhere classified: Secondary | ICD-10-CM | POA: Diagnosis not present

## 2015-09-02 DIAGNOSIS — Z992 Dependence on renal dialysis: Secondary | ICD-10-CM | POA: Diagnosis not present

## 2015-09-02 DIAGNOSIS — N186 End stage renal disease: Secondary | ICD-10-CM | POA: Diagnosis not present

## 2015-09-02 DIAGNOSIS — N049 Nephrotic syndrome with unspecified morphologic changes: Secondary | ICD-10-CM | POA: Diagnosis not present

## 2015-09-03 DIAGNOSIS — E211 Secondary hyperparathyroidism, not elsewhere classified: Secondary | ICD-10-CM | POA: Diagnosis not present

## 2015-09-03 DIAGNOSIS — Z23 Encounter for immunization: Secondary | ICD-10-CM | POA: Diagnosis not present

## 2015-09-03 DIAGNOSIS — N186 End stage renal disease: Secondary | ICD-10-CM | POA: Diagnosis not present

## 2015-09-03 DIAGNOSIS — E1121 Type 2 diabetes mellitus with diabetic nephropathy: Secondary | ICD-10-CM | POA: Diagnosis not present

## 2015-09-03 DIAGNOSIS — D631 Anemia in chronic kidney disease: Secondary | ICD-10-CM | POA: Diagnosis not present

## 2015-09-06 DIAGNOSIS — E211 Secondary hyperparathyroidism, not elsewhere classified: Secondary | ICD-10-CM | POA: Diagnosis not present

## 2015-09-06 DIAGNOSIS — D631 Anemia in chronic kidney disease: Secondary | ICD-10-CM | POA: Diagnosis not present

## 2015-09-06 DIAGNOSIS — Z23 Encounter for immunization: Secondary | ICD-10-CM | POA: Diagnosis not present

## 2015-09-06 DIAGNOSIS — N186 End stage renal disease: Secondary | ICD-10-CM | POA: Diagnosis not present

## 2015-09-06 DIAGNOSIS — E1121 Type 2 diabetes mellitus with diabetic nephropathy: Secondary | ICD-10-CM | POA: Diagnosis not present

## 2015-09-08 DIAGNOSIS — N186 End stage renal disease: Secondary | ICD-10-CM | POA: Diagnosis not present

## 2015-09-08 DIAGNOSIS — E211 Secondary hyperparathyroidism, not elsewhere classified: Secondary | ICD-10-CM | POA: Diagnosis not present

## 2015-09-08 DIAGNOSIS — D631 Anemia in chronic kidney disease: Secondary | ICD-10-CM | POA: Diagnosis not present

## 2015-09-08 DIAGNOSIS — Z23 Encounter for immunization: Secondary | ICD-10-CM | POA: Diagnosis not present

## 2015-09-08 DIAGNOSIS — E1121 Type 2 diabetes mellitus with diabetic nephropathy: Secondary | ICD-10-CM | POA: Diagnosis not present

## 2015-09-10 DIAGNOSIS — D631 Anemia in chronic kidney disease: Secondary | ICD-10-CM | POA: Diagnosis not present

## 2015-09-10 DIAGNOSIS — N186 End stage renal disease: Secondary | ICD-10-CM | POA: Diagnosis not present

## 2015-09-10 DIAGNOSIS — E1121 Type 2 diabetes mellitus with diabetic nephropathy: Secondary | ICD-10-CM | POA: Diagnosis not present

## 2015-09-10 DIAGNOSIS — Z23 Encounter for immunization: Secondary | ICD-10-CM | POA: Diagnosis not present

## 2015-09-10 DIAGNOSIS — E211 Secondary hyperparathyroidism, not elsewhere classified: Secondary | ICD-10-CM | POA: Diagnosis not present

## 2015-09-12 DIAGNOSIS — E211 Secondary hyperparathyroidism, not elsewhere classified: Secondary | ICD-10-CM | POA: Diagnosis not present

## 2015-09-12 DIAGNOSIS — E1121 Type 2 diabetes mellitus with diabetic nephropathy: Secondary | ICD-10-CM | POA: Diagnosis not present

## 2015-09-12 DIAGNOSIS — D631 Anemia in chronic kidney disease: Secondary | ICD-10-CM | POA: Diagnosis not present

## 2015-09-12 DIAGNOSIS — Z23 Encounter for immunization: Secondary | ICD-10-CM | POA: Diagnosis not present

## 2015-09-12 DIAGNOSIS — N186 End stage renal disease: Secondary | ICD-10-CM | POA: Diagnosis not present

## 2015-09-15 DIAGNOSIS — D631 Anemia in chronic kidney disease: Secondary | ICD-10-CM | POA: Diagnosis not present

## 2015-09-15 DIAGNOSIS — Z23 Encounter for immunization: Secondary | ICD-10-CM | POA: Diagnosis not present

## 2015-09-15 DIAGNOSIS — E1121 Type 2 diabetes mellitus with diabetic nephropathy: Secondary | ICD-10-CM | POA: Diagnosis not present

## 2015-09-15 DIAGNOSIS — N186 End stage renal disease: Secondary | ICD-10-CM | POA: Diagnosis not present

## 2015-09-15 DIAGNOSIS — E211 Secondary hyperparathyroidism, not elsewhere classified: Secondary | ICD-10-CM | POA: Diagnosis not present

## 2015-09-17 DIAGNOSIS — D631 Anemia in chronic kidney disease: Secondary | ICD-10-CM | POA: Diagnosis not present

## 2015-09-17 DIAGNOSIS — E1121 Type 2 diabetes mellitus with diabetic nephropathy: Secondary | ICD-10-CM | POA: Diagnosis not present

## 2015-09-17 DIAGNOSIS — E211 Secondary hyperparathyroidism, not elsewhere classified: Secondary | ICD-10-CM | POA: Diagnosis not present

## 2015-09-17 DIAGNOSIS — Z23 Encounter for immunization: Secondary | ICD-10-CM | POA: Diagnosis not present

## 2015-09-17 DIAGNOSIS — N186 End stage renal disease: Secondary | ICD-10-CM | POA: Diagnosis not present

## 2015-09-20 ENCOUNTER — Inpatient Hospital Stay (HOSPITAL_COMMUNITY)
Admission: EM | Admit: 2015-09-20 | Discharge: 2015-10-05 | DRG: 233 | Disposition: A | Discharge status: Home Health | Payer: Medicare Other | Attending: Cardiothoracic Surgery | Admitting: Cardiothoracic Surgery

## 2015-09-20 ENCOUNTER — Emergency Department (HOSPITAL_COMMUNITY): Payer: Medicare Other

## 2015-09-20 ENCOUNTER — Encounter (HOSPITAL_COMMUNITY): Payer: Self-pay | Admitting: Vascular Surgery

## 2015-09-20 DIAGNOSIS — N049 Nephrotic syndrome with unspecified morphologic changes: Secondary | ICD-10-CM | POA: Diagnosis not present

## 2015-09-20 DIAGNOSIS — I2510 Atherosclerotic heart disease of native coronary artery without angina pectoris: Secondary | ICD-10-CM | POA: Diagnosis present

## 2015-09-20 DIAGNOSIS — E1100 Type 2 diabetes mellitus with hyperosmolarity without nonketotic hyperglycemic-hyperosmolar coma (NKHHC): Secondary | ICD-10-CM | POA: Diagnosis present

## 2015-09-20 DIAGNOSIS — E1010 Type 1 diabetes mellitus with ketoacidosis without coma: Secondary | ICD-10-CM

## 2015-09-20 DIAGNOSIS — Z992 Dependence on renal dialysis: Secondary | ICD-10-CM | POA: Diagnosis not present

## 2015-09-20 DIAGNOSIS — J811 Chronic pulmonary edema: Secondary | ICD-10-CM | POA: Diagnosis not present

## 2015-09-20 DIAGNOSIS — Z4682 Encounter for fitting and adjustment of non-vascular catheter: Secondary | ICD-10-CM | POA: Diagnosis not present

## 2015-09-20 DIAGNOSIS — J9811 Atelectasis: Secondary | ICD-10-CM | POA: Diagnosis not present

## 2015-09-20 DIAGNOSIS — F329 Major depressive disorder, single episode, unspecified: Secondary | ICD-10-CM | POA: Diagnosis present

## 2015-09-20 DIAGNOSIS — F419 Anxiety disorder, unspecified: Secondary | ICD-10-CM | POA: Diagnosis present

## 2015-09-20 DIAGNOSIS — I5041 Acute combined systolic (congestive) and diastolic (congestive) heart failure: Secondary | ICD-10-CM | POA: Diagnosis present

## 2015-09-20 DIAGNOSIS — Q631 Lobulated, fused and horseshoe kidney: Secondary | ICD-10-CM

## 2015-09-20 DIAGNOSIS — Z86718 Personal history of other venous thrombosis and embolism: Secondary | ICD-10-CM

## 2015-09-20 DIAGNOSIS — Z794 Long term (current) use of insulin: Secondary | ICD-10-CM

## 2015-09-20 DIAGNOSIS — E785 Hyperlipidemia, unspecified: Secondary | ICD-10-CM | POA: Diagnosis present

## 2015-09-20 DIAGNOSIS — Z951 Presence of aortocoronary bypass graft: Secondary | ICD-10-CM

## 2015-09-20 DIAGNOSIS — I080 Rheumatic disorders of both mitral and aortic valves: Secondary | ICD-10-CM | POA: Diagnosis not present

## 2015-09-20 DIAGNOSIS — N186 End stage renal disease: Secondary | ICD-10-CM | POA: Diagnosis not present

## 2015-09-20 DIAGNOSIS — E1151 Type 2 diabetes mellitus with diabetic peripheral angiopathy without gangrene: Secondary | ICD-10-CM | POA: Diagnosis present

## 2015-09-20 DIAGNOSIS — R778 Other specified abnormalities of plasma proteins: Secondary | ICD-10-CM | POA: Insufficient documentation

## 2015-09-20 DIAGNOSIS — I120 Hypertensive chronic kidney disease with stage 5 chronic kidney disease or end stage renal disease: Secondary | ICD-10-CM | POA: Diagnosis present

## 2015-09-20 DIAGNOSIS — I25118 Atherosclerotic heart disease of native coronary artery with other forms of angina pectoris: Secondary | ICD-10-CM | POA: Diagnosis not present

## 2015-09-20 DIAGNOSIS — E1122 Type 2 diabetes mellitus with diabetic chronic kidney disease: Secondary | ICD-10-CM | POA: Diagnosis present

## 2015-09-20 DIAGNOSIS — D631 Anemia in chronic kidney disease: Secondary | ICD-10-CM | POA: Diagnosis present

## 2015-09-20 DIAGNOSIS — Z6833 Body mass index (BMI) 33.0-33.9, adult: Secondary | ICD-10-CM

## 2015-09-20 DIAGNOSIS — E669 Obesity, unspecified: Secondary | ICD-10-CM | POA: Diagnosis present

## 2015-09-20 DIAGNOSIS — E1165 Type 2 diabetes mellitus with hyperglycemia: Secondary | ICD-10-CM | POA: Diagnosis present

## 2015-09-20 DIAGNOSIS — E1110 Type 2 diabetes mellitus with ketoacidosis without coma: Secondary | ICD-10-CM | POA: Diagnosis present

## 2015-09-20 DIAGNOSIS — E1142 Type 2 diabetes mellitus with diabetic polyneuropathy: Secondary | ICD-10-CM | POA: Diagnosis present

## 2015-09-20 DIAGNOSIS — Z79899 Other long term (current) drug therapy: Secondary | ICD-10-CM

## 2015-09-20 DIAGNOSIS — I214 Non-ST elevation (NSTEMI) myocardial infarction: Principal | ICD-10-CM | POA: Diagnosis present

## 2015-09-20 DIAGNOSIS — J90 Pleural effusion, not elsewhere classified: Secondary | ICD-10-CM | POA: Diagnosis not present

## 2015-09-20 DIAGNOSIS — J45909 Unspecified asthma, uncomplicated: Secondary | ICD-10-CM | POA: Diagnosis present

## 2015-09-20 DIAGNOSIS — I132 Hypertensive heart and chronic kidney disease with heart failure and with stage 5 chronic kidney disease, or end stage renal disease: Secondary | ICD-10-CM | POA: Diagnosis present

## 2015-09-20 DIAGNOSIS — R0902 Hypoxemia: Secondary | ICD-10-CM | POA: Diagnosis not present

## 2015-09-20 DIAGNOSIS — Z87441 Personal history of nephrotic syndrome: Secondary | ICD-10-CM

## 2015-09-20 DIAGNOSIS — E1121 Type 2 diabetes mellitus with diabetic nephropathy: Secondary | ICD-10-CM | POA: Diagnosis not present

## 2015-09-20 DIAGNOSIS — R7989 Other specified abnormal findings of blood chemistry: Secondary | ICD-10-CM

## 2015-09-20 DIAGNOSIS — N2581 Secondary hyperparathyroidism of renal origin: Secondary | ICD-10-CM | POA: Diagnosis present

## 2015-09-20 DIAGNOSIS — K219 Gastro-esophageal reflux disease without esophagitis: Secondary | ICD-10-CM | POA: Diagnosis present

## 2015-09-20 DIAGNOSIS — E119 Type 2 diabetes mellitus without complications: Secondary | ICD-10-CM | POA: Diagnosis not present

## 2015-09-20 DIAGNOSIS — R072 Precordial pain: Secondary | ICD-10-CM

## 2015-09-20 DIAGNOSIS — R079 Chest pain, unspecified: Secondary | ICD-10-CM | POA: Diagnosis not present

## 2015-09-20 DIAGNOSIS — H353230 Exudative age-related macular degeneration, bilateral, stage unspecified: Secondary | ICD-10-CM | POA: Diagnosis present

## 2015-09-20 DIAGNOSIS — I10 Essential (primary) hypertension: Secondary | ICD-10-CM | POA: Diagnosis present

## 2015-09-20 DIAGNOSIS — I252 Old myocardial infarction: Secondary | ICD-10-CM

## 2015-09-20 DIAGNOSIS — R262 Difficulty in walking, not elsewhere classified: Secondary | ICD-10-CM

## 2015-09-20 DIAGNOSIS — E039 Hypothyroidism, unspecified: Secondary | ICD-10-CM | POA: Diagnosis present

## 2015-09-20 DIAGNOSIS — E211 Secondary hyperparathyroidism, not elsewhere classified: Secondary | ICD-10-CM | POA: Diagnosis not present

## 2015-09-20 DIAGNOSIS — Z23 Encounter for immunization: Secondary | ICD-10-CM | POA: Diagnosis not present

## 2015-09-20 DIAGNOSIS — Z452 Encounter for adjustment and management of vascular access device: Secondary | ICD-10-CM | POA: Diagnosis not present

## 2015-09-20 DIAGNOSIS — IMO0002 Reserved for concepts with insufficient information to code with codable children: Secondary | ICD-10-CM | POA: Diagnosis present

## 2015-09-20 DIAGNOSIS — I25110 Atherosclerotic heart disease of native coronary artery with unstable angina pectoris: Secondary | ICD-10-CM | POA: Diagnosis not present

## 2015-09-20 DIAGNOSIS — Z9111 Patient's noncompliance with dietary regimen: Secondary | ICD-10-CM

## 2015-09-20 DIAGNOSIS — E46 Unspecified protein-calorie malnutrition: Secondary | ICD-10-CM | POA: Diagnosis present

## 2015-09-20 DIAGNOSIS — Z09 Encounter for follow-up examination after completed treatment for conditions other than malignant neoplasm: Secondary | ICD-10-CM

## 2015-09-20 DIAGNOSIS — R0602 Shortness of breath: Secondary | ICD-10-CM | POA: Diagnosis not present

## 2015-09-20 DIAGNOSIS — Z9689 Presence of other specified functional implants: Secondary | ICD-10-CM

## 2015-09-20 DIAGNOSIS — E1129 Type 2 diabetes mellitus with other diabetic kidney complication: Secondary | ICD-10-CM | POA: Diagnosis not present

## 2015-09-20 HISTORY — DX: Atherosclerotic heart disease of native coronary artery with unstable angina pectoris: I25.110

## 2015-09-20 LAB — I-STAT TROPONIN, ED: Troponin i, poc: 1.59 ng/mL (ref 0.00–0.08)

## 2015-09-20 LAB — COMPREHENSIVE METABOLIC PANEL
ALBUMIN: 3.1 g/dL — AB (ref 3.5–5.0)
ALK PHOS: 100 U/L (ref 38–126)
ALT: 15 U/L — AB (ref 17–63)
AST: 22 U/L (ref 15–41)
Anion gap: 11 (ref 5–15)
BILIRUBIN TOTAL: 0.5 mg/dL (ref 0.3–1.2)
BUN: 40 mg/dL — AB (ref 6–20)
CO2: 24 mmol/L (ref 22–32)
Calcium: 8.2 mg/dL — ABNORMAL LOW (ref 8.9–10.3)
Chloride: 94 mmol/L — ABNORMAL LOW (ref 101–111)
Creatinine, Ser: 4.89 mg/dL — ABNORMAL HIGH (ref 0.61–1.24)
GFR calc Af Amer: 15 mL/min — ABNORMAL LOW (ref >60)
GFR, EST NON AFRICAN AMERICAN: 13 mL/min — AB (ref >60)
GLUCOSE: 754 mg/dL — AB (ref 65–99)
Potassium: 4.5 mmol/L (ref 3.5–5.1)
Sodium: 129 mmol/L — ABNORMAL LOW (ref 135–145)
Total Protein: 6.5 g/dL (ref 6.5–8.1)

## 2015-09-20 LAB — BASIC METABOLIC PANEL
ANION GAP: 12 (ref 5–15)
ANION GAP: 10 (ref 5–15)
BUN: 42 mg/dL — AB (ref 6–20)
BUN: 44 mg/dL — ABNORMAL HIGH (ref 6–20)
CHLORIDE: 102 mmol/L (ref 101–111)
CO2: 24 mmol/L (ref 22–32)
CO2: 25 mmol/L (ref 22–32)
CREATININE: 5.39 mg/dL — AB (ref 0.61–1.24)
Calcium: 8.4 mg/dL — ABNORMAL LOW (ref 8.9–10.3)
Calcium: 8.5 mg/dL — ABNORMAL LOW (ref 8.9–10.3)
Chloride: 99 mmol/L — ABNORMAL LOW (ref 101–111)
Creatinine, Ser: 5.26 mg/dL — ABNORMAL HIGH (ref 0.61–1.24)
GFR calc non Af Amer: 12 mL/min — ABNORMAL LOW (ref >60)
GFR, EST AFRICAN AMERICAN: 14 mL/min — AB (ref >60)
GFR, EST AFRICAN AMERICAN: 14 mL/min — AB (ref >60)
GFR, EST NON AFRICAN AMERICAN: 12 mL/min — AB (ref >60)
Glucose, Bld: 197 mg/dL — ABNORMAL HIGH (ref 65–99)
Glucose, Bld: 160 mg/dL — ABNORMAL HIGH (ref 65–99)
POTASSIUM: 3.3 mmol/L — AB (ref 3.5–5.1)
POTASSIUM: 3.9 mmol/L (ref 3.5–5.1)
SODIUM: 137 mmol/L (ref 135–145)
Sodium: 135 mmol/L (ref 135–145)

## 2015-09-20 LAB — URINALYSIS, ROUTINE W REFLEX MICROSCOPIC
BILIRUBIN URINE: NEGATIVE
Glucose, UA: >1000 mg/dL — AB
Ketones, ur: NEGATIVE mg/dL
LEUKOCYTES UA: NEGATIVE
NITRITE: NEGATIVE
Protein, ur: >300 mg/dL — AB
SPECIFIC GRAVITY, URINE: 1.025 (ref 1.005–1.030)
pH: 6.5 (ref 5.0–8.0)

## 2015-09-20 LAB — I-STAT ARTERIAL BLOOD GAS, ED
Bicarbonate: 24.7 mmol/L (ref 20.0–28.0)
O2 Saturation: 95 %
PCO2 ART: 39.5 mmHg (ref 32.0–48.0)
PH ART: 7.403 (ref 7.350–7.450)
Patient temperature: 98.4
TCO2: 26 mmol/L (ref 0–100)
pO2, Arterial: 76 mmHg — ABNORMAL LOW (ref 83.0–108.0)

## 2015-09-20 LAB — GLUCOSE, CAPILLARY
GLUCOSE-CAPILLARY: 216 mg/dL — AB (ref 65–99)
GLUCOSE-CAPILLARY: 184 mg/dL — AB (ref 65–99)
GLUCOSE-CAPILLARY: 185 mg/dL — AB (ref 65–99)
GLUCOSE-CAPILLARY: 184 mg/dL — AB (ref 65–99)
GLUCOSE-CAPILLARY: 139 mg/dL — AB (ref 65–99)
GLUCOSE-CAPILLARY: 115 mg/dL — AB (ref 65–99)

## 2015-09-20 LAB — CBC
HEMATOCRIT: 27.6 % — AB (ref 39.0–52.0)
HEMOGLOBIN: 9.3 g/dL — AB (ref 13.0–17.0)
MCH: 27.9 pg (ref 26.0–34.0)
MCHC: 33.7 g/dL (ref 30.0–36.0)
MCV: 82.9 fL (ref 78.0–100.0)
Platelets: 262 10*3/uL (ref 150–400)
RBC: 3.33 MIL/uL — ABNORMAL LOW (ref 4.22–5.81)
RDW: 15.5 % (ref 11.5–15.5)
WBC: 6.3 10*3/uL (ref 4.0–10.5)

## 2015-09-20 LAB — CBG MONITORING, ED
GLUCOSE-CAPILLARY: 316 mg/dL — AB (ref 65–99)
Glucose-Capillary: >600 mg/dL (ref 65–99)
Glucose-Capillary: >600 mg/dL (ref 65–99)

## 2015-09-20 LAB — MRSA PCR SCREENING: MRSA by PCR: NEGATIVE

## 2015-09-20 LAB — RAPID URINE DRUG SCREEN, HOSP PERFORMED
Amphetamines: NOT DETECTED
BARBITURATES: NOT DETECTED
BENZODIAZEPINES: NOT DETECTED
Cocaine: NOT DETECTED
Opiates: NOT DETECTED
TETRAHYDROCANNABINOL: NOT DETECTED

## 2015-09-20 LAB — URINE MICROSCOPIC-ADD ON

## 2015-09-20 LAB — TROPONIN I
TROPONIN I: 1.71 ng/mL — AB (ref <0.03)
TROPONIN I: 2.1 ng/mL — AB (ref <0.03)
Troponin I: 1.83 ng/mL (ref <0.03)

## 2015-09-20 LAB — OSMOLALITY: OSMOLALITY: 304 mosm/kg — AB (ref 275–295)

## 2015-09-20 LAB — APTT: APTT: 28 s (ref 24–36)

## 2015-09-20 LAB — PROTIME-INR
INR: 1.15
PROTHROMBIN TIME: 14.7 s (ref 11.4–15.2)

## 2015-09-20 MED ORDER — CITALOPRAM HYDROBROMIDE 20 MG PO TABS
60.0000 mg | ORAL_TABLET | Freq: Every day | ORAL | Status: DC
  Administered 2015-09-21 – 2015-09-26 (×6): 60 mg via ORAL
  Filled 2015-09-20 (×7): qty 3

## 2015-09-20 MED ORDER — HEPARIN (PORCINE) IN NACL 100-0.45 UNIT/ML-% IJ SOLN
1650.0000 [IU]/h | INTRAMUSCULAR | Status: DC
  Administered 2015-09-20: 1500 [IU]/h via INTRAVENOUS
  Administered 2015-09-21 (×2): 1650 [IU]/h via INTRAVENOUS
  Filled 2015-09-20 (×3): qty 250

## 2015-09-20 MED ORDER — HEPARIN BOLUS VIA INFUSION
4000.0000 [IU] | Freq: Once | INTRAVENOUS | Status: AC
  Administered 2015-09-20: 4000 [IU] via INTRAVENOUS
  Filled 2015-09-20: qty 4000

## 2015-09-20 MED ORDER — LORAZEPAM 2 MG/ML IJ SOLN
1.0000 mg | Freq: Once | INTRAMUSCULAR | Status: AC
  Administered 2015-09-20: 1 mg via INTRAVENOUS
  Filled 2015-09-20: qty 1

## 2015-09-20 MED ORDER — CALCIUM ACETATE (PHOS BINDER) 667 MG PO CAPS
1334.0000 mg | ORAL_CAPSULE | Freq: Three times a day (TID) | ORAL | Status: DC
  Administered 2015-09-21 (×3): 1334 mg via ORAL
  Filled 2015-09-20 (×4): qty 2

## 2015-09-20 MED ORDER — ACETAMINOPHEN 500 MG PO TABS
500.0000 mg | ORAL_TABLET | Freq: Four times a day (QID) | ORAL | Status: DC | PRN
  Administered 2015-09-21 – 2015-09-27 (×9): 500 mg via ORAL
  Filled 2015-09-20 (×10): qty 1

## 2015-09-20 MED ORDER — CLONAZEPAM 1 MG PO TABS
2.0000 mg | ORAL_TABLET | Freq: Every day | ORAL | Status: DC
  Administered 2015-09-20 – 2015-09-27 (×8): 2 mg via ORAL
  Filled 2015-09-20: qty 2
  Filled 2015-09-20: qty 4
  Filled 2015-09-20 (×2): qty 2
  Filled 2015-09-20: qty 4
  Filled 2015-09-20: qty 2
  Filled 2015-09-20: qty 4
  Filled 2015-09-20: qty 2

## 2015-09-20 MED ORDER — METOCLOPRAMIDE HCL 5 MG PO TABS
5.0000 mg | ORAL_TABLET | Freq: Three times a day (TID) | ORAL | Status: DC | PRN
  Administered 2015-09-22: 5 mg via ORAL
  Filled 2015-09-20 (×2): qty 1

## 2015-09-20 MED ORDER — DEXTROSE-NACL 5-0.45 % IV SOLN: INTRAVENOUS | Status: DC

## 2015-09-20 MED ORDER — SODIUM CHLORIDE 0.9 % IV SOLN
INTRAVENOUS | Status: DC
  Administered 2015-09-20: 5.4 [IU]/h via INTRAVENOUS
  Filled 2015-09-20: qty 2.5

## 2015-09-20 MED ORDER — GI COCKTAIL ~~LOC~~: 30.0000 mL | Freq: Four times a day (QID) | ORAL | Status: DC | PRN

## 2015-09-20 MED ORDER — SODIUM CHLORIDE 0.9 % IV SOLN
Freq: Once | INTRAVENOUS | Status: AC
  Administered 2015-09-20: 125 mL/h via INTRAVENOUS

## 2015-09-20 MED ORDER — LEVOTHYROXINE SODIUM 50 MCG PO TABS
50.0000 ug | ORAL_TABLET | Freq: Every day | ORAL | Status: DC
  Administered 2015-09-21 – 2015-10-05 (×12): 50 ug via ORAL
  Filled 2015-09-20 (×13): qty 1

## 2015-09-20 MED ORDER — SODIUM CHLORIDE 0.9 % IV SOLN
INTRAVENOUS | Status: DC
  Administered 2015-09-20: 3.1 [IU]/h via INTRAVENOUS
  Filled 2015-09-20: qty 2.5

## 2015-09-20 MED ORDER — AMLODIPINE BESYLATE 5 MG PO TABS
5.0000 mg | ORAL_TABLET | Freq: Every day | ORAL | Status: DC
  Administered 2015-09-21 – 2015-09-24 (×5): 5 mg via ORAL
  Filled 2015-09-20 (×5): qty 1

## 2015-09-20 MED ORDER — GI COCKTAIL ~~LOC~~
30.0000 mL | Freq: Once | ORAL | Status: DC
Start: 2015-09-20 — End: 2015-09-25
  Filled 2015-09-20: qty 30

## 2015-09-20 MED ORDER — HYDROMORPHONE HCL 1 MG/ML IJ SOLN
0.5000 mg | INTRAMUSCULAR | Status: DC | PRN
  Administered 2015-09-20 (×2): 0.5 mg via INTRAVENOUS
  Filled 2015-09-20 (×2): qty 1

## 2015-09-20 MED ORDER — SODIUM CHLORIDE 0.9 % IV SOLN: INTRAVENOUS | Status: DC

## 2015-09-20 MED ORDER — HYDROMORPHONE HCL 1 MG/ML IJ SOLN
1.0000 mg | INTRAMUSCULAR | Status: AC | PRN
  Administered 2015-09-20: 1 mg via INTRAVENOUS
  Filled 2015-09-20: qty 1

## 2015-09-20 MED ORDER — DEXTROSE-NACL 5-0.45 % IV SOLN
INTRAVENOUS | Status: DC
  Administered 2015-09-20: 20:00:00 via INTRAVENOUS

## 2015-09-20 MED ORDER — POTASSIUM CHLORIDE 10 MEQ/100ML IV SOLN
10.0000 meq | INTRAVENOUS | Status: AC
  Administered 2015-09-20 (×2): 10 meq via INTRAVENOUS
  Filled 2015-09-20 (×2): qty 100

## 2015-09-20 MED ORDER — INSULIN GLARGINE 100 UNIT/ML ~~LOC~~ SOLN
32.0000 [IU] | Freq: Every day | SUBCUTANEOUS | Status: DC
  Administered 2015-09-20 – 2015-09-21 (×2): 32 [IU] via SUBCUTANEOUS
  Filled 2015-09-20 (×3): qty 0.32

## 2015-09-20 MED ORDER — METOPROLOL TARTRATE 50 MG PO TABS
50.0000 mg | ORAL_TABLET | Freq: Two times a day (BID) | ORAL | Status: DC
  Administered 2015-09-20 – 2015-09-21 (×2): 50 mg via ORAL
  Filled 2015-09-20 (×2): qty 1

## 2015-09-20 MED ORDER — INSULIN ASPART 100 UNIT/ML ~~LOC~~ SOLN
0.0000 [IU] | Freq: Three times a day (TID) | SUBCUTANEOUS | Status: DC
  Administered 2015-09-21: 8 [IU] via SUBCUTANEOUS
  Administered 2015-09-21: 3 [IU] via SUBCUTANEOUS
  Administered 2015-09-21: 8 [IU] via SUBCUTANEOUS
  Administered 2015-09-23: 07:00:00 3 [IU] via SUBCUTANEOUS

## 2015-09-20 NOTE — H&P (Signed)
[facility] History and Physical Service Pager: [phone]  Patient name: Russell BirkenheadSteven B [name] Medical record number: [id number] Date of birth: [birth year] Age: 45 y.o. Gender: male  Primary Care Provider: [name], MD Consultants: Cardiology Code Status: FULL  Chief Complaint: chest pain, hyperglycemia  Assessment and Plan: Russell BirkenheadSteven B Dillinger is a 45 y.o. male presenting with hyperglycemia, chest pain.Marland Kitchen. PMH is significant for ESRD on HD MWF, insulin dependent T2DM, HTN, anemia of chronic disease, peripheral neuropathy, hypothyroidism, depression, GERD.   # Hyperglycemia: Consider DKA vs HHS. Doesn't meet criteria for DKA as no anion gap. Has not had a complete work-up in the ED. May meet criteria for HHS.  Unsure of etiology. Possibly chest pain/MI (see below), no obvious infection, possible issue with current regimen vs compliance.  Reports nausea, vomiting, abdominal pain - this is most likely from hyperglycemia. Admit glucose 754. Anion gap 12. Serum K+ 4.5. Sodium 129 with corrected Na 139. Will monitor BMP to make sure gap doesn't open. If opens, will initiate DKA protocol.  - admit to SDU, attending Dr. Jennette KettleNeal.  -on insulin drip  -pending ABGs -pending UA to look for ketonuria.  - monitor BMP q4h to ensure gap doesn't open.  -give 2 runs potassium - IVF NS at 50 ml/hr  -ordered osmolality, PT/INR -reglan prn nausea -CBGs q 1hr while on insulin gtt.  - if N/V does not improve with improvement in CBGs, consider imaging.   # Chest Pain: first troponin 1.59. EKG showed no acute changes but some abnormal repolarization.  Reproducible with abdominal palpation not typical. Last echo 2016 showed normal LVEF w/ moderate LV hypertrophy. Qtc prolonged at 498. If troponin rises, cards will consider cath. HEART score 3, low risk. HEART score 5 (risk factors, EKG, troponin level).  - cardiology consulted - continue to trend troponin - on heparin drip -will  check lipids -will get echo per cardiology - GI cocktail given pain seems epigastric and worsens with RLQ palpation.   # Hypertension: hx of essential hypertension. Was on amlodipine, clonidine, metoprolol, but now on them prn. 184/102. Will titrate up BP meds as tolerated -get UDS - per cardiology, resume amlodipine 5mg , metoprolol tartrate 50mg  BID. -consider IV nitro if significant HTN.   # T2DM: insulin dependent, uncontrolled. Last A1c 16.8 on 07/20/15. Home regimen humalog 5:1 and lantus 60 units BID. Will switch from drip to subcutaneous if glucose <200. -insulin drip as above - holding home insulin.  - management as above.   # Headache: localized to occipital region, likely tension type - tylenol prn pain  # ESRD: MWF dialysis since 12/2014 after developing nephrotic syndrome, followed by Dr. Kathrene BongoGoldsborough. H/o horseshoe kidney. -continue home calcium acetate - if inpatient for a day or two, consider c/s to renal as he did not complete HD today. No evidence for emergent HD.   # Hypothyroidism: stable, last TSH 1.2 on 07/20/15 -continue home synthroid  # Depression: stable -continue home celexa, clonopin - continue to monitor QTc in setting of celexa use.   # Anemia of chronic kidney disease: Hgb 9.3 on admission. Baseline Hgb 11.  -monitor CBC Unsure if pt receives EPO with HD.  -continue home calcium acetate for ESRD   # Peripheral neuropathy: secondary to T2DM -holding home gabapentin - not continuing home pain meds for now- has 1 time dose of PRN Dilaudid IV.  FEN/GI: carb modified/renal diet Prophylaxis: heparin drip  Disposition: admit to SDU, Dr. Jennette KettleNeal  History of Present Illness:  Russell [name] is a 45 y.o. male presenting with chest pain, hyperglycemia.  He notes that Friday at HD he had 7L removed which was unusual. Over the weekend he had excruciating HAs and chest pain intermittently that has progressively worsened. He took an amlodipine which did not  help. 2 hours into his HD today, he noted he couldn't tolerate it any more. Central/right sided chest pain was worse and felt like someone stabbed and twisted it in his chest. He stated having SOB last night, notes he didn't have issues with this on HD. He sometimes gets this way when he has too much fluid.  His L hand is numb, and now his R hand is getting numb.   His headache is occipital in nature and going up in his neck. He takes Dilaudid at home for "neuropathy" and any pain Tylenol doesn't help with.  He notes he's been off all his anti-hypertensives for months as his BP was too low at HD.   He has nausea and 2 episodes of vomiting, once last night and once today. He took Reglan and phenergran- he notes zofran doesn't help him. No "abdominal pain" but notes he is tender to palpation in the RLQ. Normal urination and BMs. No abnormal bleeding, bruises.   No fevers or chills. Some SOB lying supine. Notes he has to sleep more upright when his "fluid is up."   He is on Humalog 5:1. He takes Lantus 60 units BID. He notes he's been taking insulin regularly. He was 238 this AM. He is unsure why his blood sugar is so high today. No recent infections. He has a small cut on his toe but it is not infected. No change in diet.    In the ED his CBG was 754 which concerned them for DKA. Cardiology was consulted for chest pain. FPTS asked to manage/evaluate further.   Review Of Systems: Per HPI   Patient Active Problem List   Diagnosis Date Noted  . Angina pectoris (HCC) 07/20/2015  . Chest pain 07/20/2015  . Hyperglycemia 07/20/2015  . DKA (diabetic ketoacidoses) (HCC) 02/13/2015  . ESRD on dialysis (HCC) 02/13/2015  . Nausea and vomiting 02/13/2015  . Nephrotic syndrome 01/03/2015  . Hypertensive urgency 12/30/2014  . Essential hypertension, benign   . Thyroid activity decreased   . Hx of gastroesophageal reflux (GERD) 09/30/2014  . Prolonged Q-T interval on ECG 09/30/2014  . Acute on chronic  renal failure (HCC) 09/24/2014  . Diabetic neuropathy (HCC) 09/21/2014  . Uncontrolled diabetes mellitus type 2 with peripheral artery disease (HCC) 09/21/2014  . Morbid obesity (HCC) 09/21/2014  . Superficial thrombophlebitis 03/24/2014  . Hypothyroidism (acquired) 03/24/2014    Past Medical History: Past Medical History:  Diagnosis Date  . Age-related macular degeneration, wet, both eyes (HCC)    "I'm getting Alia treatments" (12/30/2014)  . Anxiety   . Arthritis    "hands" (12/30/2014)  . Asthma   . 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 Daily headache   . Depression   . DVT (deep venous thrombosis), H/o 01/2014-on Xarelto 03/24/2014   LLE  . Hypertension   . Hypothyroidism   . Nephrotic syndrome 05/18/2014  . Pneumonia 11/2013  . Renal insufficiency   . Secondary DM with DKA-AG=16, BIcarb Nl 03/24/2014  . Type 2 diabetes mellitus with diabetic nephropathy [facility])     Past Surgical History: Past Surgical History:  Procedure Laterality Date  . ANKLE FRACTURE SURGERY Right 1988  .  AV FISTULA PLACEMENT Left 01/01/2015   Procedure: CREATION OF LEFT RADIAL CEPHALIC ARTERIOVENOUS (AV) FISTULA ;  Surgeon: Pryor Ochoa, MD;  Location: [facility] OR;  Service: Vascular;  Laterality: Left;  . ESOPHAGOGASTRODUODENOSCOPY N/A 03/29/2014   Procedure: ESOPHAGOGASTRODUODENOSCOPY (EGD);  Surgeon: Dorena Cookey, MD;  Location: [facility];  Service: Endoscopy;  Laterality: N/A;  . EYE SURGERY    . FRACTURE SURGERY    . INSERTION OF DIALYSIS CATHETER Right 01/05/2015   Procedure: INSERTION OF RIGHT INTERNAL JUGULAR DIALYSIS CATHETER;  Surgeon: Fransisco Hertz, MD;  Location: [facility] OR;  Service: Vascular;  Laterality: Right;  . TONSILLECTOMY AND ADENOIDECTOMY  1970s    Social History: Social History  Substance Use Topics  . Smoking status: Never Smoker  . Smokeless tobacco: Never Used  . Alcohol use No   Additional social history: no smoking, alcohol, and drug use.  Please also refer to  relevant sections of EMR.  Family History: Family History  Problem Relation Age of Onset  . Obesity Mother     Patient states that family members have no other medical illnesses other than what I have described  . Kidney cancer Maternal Grandmother   . Cancer Father 50    AML  . Heart disease      No family history    Allergies and Medications: Allergies  Allergen Reactions  . Ibuprofen Other (See Comments)    MD told him not to take due to kidney disease.  . Nsaids Other (See Comments)    Told to avoid all nsaids due to kidney disease-takes acetaminophen as needed   No current facility-administered medications on file prior to encounter.    Current Outpatient Prescriptions on File Prior to Encounter  Medication Sig Dispense Refill  . acetaminophen (TYLENOL) 500 MG tablet Take 1 tablet (500 mg total) by mouth every 6 (six) hours as needed for moderate pain. (Patient taking differently: Take 1,000 mg by mouth every 6 (six) hours as needed for moderate pain. ) 30 tablet 0  . albuterol (PROVENTIL HFA;VENTOLIN HFA) 108 (90 Base) MCG/ACT inhaler Inhale 1 puff into the lungs every 6 (six) hours as needed for wheezing or shortness of breath. 1 Inhaler 11  . albuterol-ipratropium (COMBIVENT) 18-103 MCG/ACT inhaler Inhale 2 puffs into the lungs every 4 (four) hours. (Patient taking differently: Inhale 2 puffs into the lungs every 6 (six) hours as needed for wheezing or shortness of breath. ) 1 Inhaler 11  . amLODipine (NORVASC) 10 MG tablet Take 1 tablet (10 mg total) by mouth daily. 30 tablet 2  . calcium acetate (PHOSLO) 667 MG capsule Take 2 capsules (1,334 mg total) by mouth 3 (three) times daily with meals. 180 capsule 0  . citalopram (CELEXA) 40 MG tablet Take 1.5 tablets (60 mg total) by mouth at bedtime. 90 tablet 3  . clonazePAM (KLONOPIN) 2 MG tablet Take 2 mg by mouth at bedtime.    . cloNIDine (CATAPRES) 0.1 MG tablet Take 1 tablet (0.1 mg total) by mouth 2 (two) times daily.  (Patient taking differently: Take 0.2 mg by mouth 2 (two) times daily. ) 60 tablet 0  . cyclobenzaprine (FLEXERIL) 10 MG tablet Take 1 tablet (10 mg total) by mouth at bedtime. 30 tablet 11  . gabapentin (NEURONTIN) 100 MG capsule Take 2 capsules (200 mg total) by mouth 3 (three) times daily. (Patient taking differently: Take 100-200 mg by mouth 4 (four) times daily as needed. ) 180 capsule 11  . HYDROmorphone (DILAUDID) 2 MG tablet Take  1 tablet (2 mg total) by mouth every 8 (eight) hours as needed for severe pain. (Patient taking differently: Take 2-4 mg by mouth every 8 (eight) hours as needed for severe pain. ) 90 tablet 0  . hydrOXYzine (ATARAX/VISTARIL) 25 MG tablet Take 25 mg by mouth 2 (two) times daily as needed for anxiety.     . insulin glargine (LANTUS) 100 UNIT/ML injection Inject 60 Units into the skin 2 (two) times daily.    Marland Kitchen levothyroxine (SYNTHROID, LEVOTHROID) 50 MCG tablet Take 1 tablet (50 mcg total) by mouth daily. 90 tablet 3  . metoCLOPramide (REGLAN) 5 MG tablet Take 1 tablet (5 mg total) by mouth every 8 (eight) hours as needed for nausea. 30 tablet 11  . metoprolol succinate (TOPROL-XL) 100 MG 24 hr tablet Take 1 tablet (100 mg total) by mouth daily. (Patient taking differently: Take 100 mg by mouth at bedtime as needed. BP greater than 170) 90 tablet 3  . promethazine (PHENERGAN) 25 MG tablet Take 1 tablet (25 mg total) by mouth every 8 (eight) hours as needed for nausea. 90 tablet 11  . omeprazole (PRILOSEC) 40 MG capsule Take 1 capsule (40 mg total) by mouth 2 (two) times daily. (Patient not taking: Reported on 09/20/2015) 60 capsule 0  . polyethylene glycol (MIRALAX) packet Take 17 g by mouth daily. (Patient not taking: Reported on 09/20/2015) 14 each 0  . terbinafine (LAMISIL) 250 MG tablet Take 1 tablet (250 mg total) by mouth daily. (Patient not taking: Reported on 09/20/2015) 30 tablet 2    Objective: BP (!) 184/102   Pulse 101   Temp 98.4 F (36.9 C) (Oral)   Resp  21   Ht 6\' 2"  (1.88 m)   Wt 120 kg (264 lb 9 oz)   SpO2 98%   BMI 33.97 kg/m  Exam: General: Obese male lying in bed in NAD Eyes: PERRL, sclera anicteric ENTM: Oropharynx without erythema, MMM Neck: Thick neck, supple, no LAD, no JVD Cardiovascular: Tachycardia, regular rhythym, no murmur I/6 systolic murmur, chest pain reproducible with RLQ palpation Respiratory: CTAB, no increased work of breathing Abdomen: soft, bowel sounds present. Voluntary guarding. Rebound tenderness per report but noted in the chest. RLQ tender to palpation. No involuntary guarding. Pt gets up quickly from a supine position and seems to be in no acute distress.  Skin: Warm, dry, no peripheral edema. Small healing wound on medial R great toe. Psych: appropriate mood and affect  Labs and Imaging: CBC BMET   Recent Labs Lab 09/20/15 1202  WBC 6.3  HGB 9.3*  HCT 27.6*  PLT 262    Recent Labs Lab 09/20/15 1202  NA 129*  K 4.5  CL 94*  CO2 24  BUN 40*  CREATININE 4.89*  GLUCOSE 754*  CALCIUM 8.2*     Dg Chest 2 View  Result Date: 09/20/2015 CLINICAL DATA:  Chest pain and shortness of breath. EXAM: CHEST  2 VIEW COMPARISON:  07/19/2015 FINDINGS: The heart size and mediastinal contours are within normal limits. Both lungs are clear. The visualized skeletal structures are unremarkable. IMPRESSION: No active cardiopulmonary disease. Electronically Signed   By: Kennith Center M.D.   On: 09/20/2015 13:23   EKG: NSR abnormal V1-V4 R wave progression. Precordial q waves. QTc 498.     Ernestina Columbia, Medical Student 09/20/2015, 2:42 PM [facility] FPTS Intern pager: [phone], text pages welcome  RESIDENT ADDENDUM  I have separately seen and examined the patient. I have discussed the findings  and exam with the medical student and agree with the above note, which I have edited appropriately. I helped develop the management plan that is described in the student's note, and I agree with the  content.  Additionally I have outlined my exam and assessment/plan below:   Patient presenting with multiple complaints: Chest pain: since Friday after HD. Centrally/right sided. Feels like stabbing and twisting. Some SOB when he has to much fluid. Notes his left hand is numb which started around Friday, now R is going numb since being in ED. Occasionally has orthopnea and PND  N/V and abdominal pain: nausea. 2 episodes of emesis. Notes with palpation in RLQ he has pain in central chest. Normal BMs.   PE:  Blood pressure 152/96, pulse 101, temperature 98.4 F (36.9 C), temperature source Oral, resp. rate 17, height 6\' 2"  (1.88 m), weight 264 lb 9 oz (120 kg), SpO2 97 %. General: Lying in bed in NAD. Non-toxic. Obese Eyes: Conjunctivae non-injected. PERRL.  ENTM: Moist mucous membranes. Oropharynx clear. No nasal discharge.  Neck: Supple, no LAD Cardiovascular: Tachycardic. Regular rhythm. I/6 systolic murmur. No rubs or gallops noted.  Trace pitting edema noted. Respiratory: No increased WOB. CTAB without wheezing, rhonchi, or crackles noted. Abdomen: +BS, soft, non-distended, voluntary guarding. Notes he has referred chest pain with palpation of RLQ. No rebound or involuntary guarding. He sits up quickly from a supine position without noticeable pain.  MSK: Normal bulk and tone noted. No gross deformities noted.  Skin: No rashes noted over exposed skin. Has a small abrasion over toe, no evidence of infection.  Neuro: A&O x4. No gross neurologic deficits  Psych:  Appropriate mood and affect.    A/P:  Hyperglycemia: doesn't meet criteria for DKA as he does not have an anion gap. No U/A performed to look for ketonuria. No abg done to look for acidosis. Patient endorses compliance and does not seem to have any active infection per report. Given chest pain, initially  I had some concerns for DKA due to MI.  Pt notes CBGs normally in the 200s which isn't great control, however his A1c of 16.8 in  07/2015 would suggest this is not accurate. - admit to SDU, attending Dr. Jennette Kettle - insulin gtt with light hydration- 50cc/hr given ESRD - abg, U/A ordered to see if this could be HHS  - repeat BMET to make sure anion gap is not opening.  - discussed with on coming team that when his CBGs get around 200 they should start SQ long acting insulin. Advised to d/c fluids (including D5-1/2NS which had to be ordered as part of the order set- on the lowest amount- 10cc/hr).   Chest pain: HEART score 5 (EKG, risk factors troponin of 1.59). It is odd that palpation of his abdomen causes chest pain- this would suggest more GI, however his risk is significant.  Also possible some volume overload, possibly due to ESRD due to intermittent orthopnea and PND. Given body habitus, most likely has OSA as well, doesn't look like this has been worked up.  - cardiology following, appreciate recs - heparin gtt - trend troponins  - EKG in AM - echo ordered    Joanna Puff, MD  PGY-3,  [facility] 09/20/2015  6:01 PM

## 2015-09-20 NOTE — Consult Note (Signed)
Reason for Consult: elevated troponin with HTN   Referring Physician: Dr. Vanita Panda ER MD    PCP:  [name], MD  Primary Cardiologist:Dr. Zollie Scale [place] is an 45 y.o. male.    Chief Complaint: BP elevated with dialysis today and pt felt bad  Glucose > 600   HPI:   34 yoM with hx of neg nuc for ischemia 06/2014 done to risk stratify.   Previous admit with March 2016 with nausea and vomiting. Felt to be in DKA which improved with insulin and IV fluids. Urinalysis March 2016 showed greater than 300 mg/dL of protein. Normal gastric emptying study. Right upper quadrant ultrasound unremarkable. EGD showed severe erosive esophagitis.  Hx bIL dvt IN 06/2014  Saw hematology but he believes tests to be normal as they never called him back.   Pt with end stage renal disease with HD.  He has a h/o horseshoe kidney.  Echocardiogram May 2016 showed normal LV function. There was moderate left ventricular hypertrophy. Renal Dopplers May 2016 normal.  Pt went on dialysis MWF in 12/2014.   With dialysis he could not take his BP meds or systolic BP would be in the 60s so has not had clonidine, amlodipine or metoprolol.  His BP per pt has been controlled    He had been doing well until Friday  In dialysis he  7L removed and pt did not feel well.  Had chest pain, pulling the fluid off his heart felt like it was being contracted.  He went home and slept.  Sat and Sunday he had continued chest pain.  More mid sternal.  Increases with deep breath, he would have some diaphoresis but no nausea.  His glucose was 200s over weekend.  BP was elevated and today he took amlodipine.  When he got to dialysis the longer he stayed on dialysis the worse his chest pain was and he told them to stop dialysis.   Here in ER troponin 1.59, Hgb 9.3, glucose 754, Na 129, co2 24  EKG SR with Q waves in ant leads. No acute changes.   Here he has had dilaudid .5 mg.  He is on insulin drip.  Pain is down from 8-9  to 7/10 his headache is improving and his BP is slowly drifting down.    Past Medical History:  Diagnosis Date  . Age-related macular degeneration, wet, both eyes ([place])    "I'm getting Alia treatments" (12/30/2014)  . Anxiety   . Arthritis    "hands" (12/30/2014)  . Asthma   . CKD stage 3 due to type 2 diabetes mellitus ([place])   . Daily headache   . Depression   . DVT (deep venous thrombosis), H/o 01/2014-on Xarelto 03/24/2014   LLE  . Hypertension   . Hypothyroidism   . Nephrotic syndrome 05/18/2014  . Pneumonia 11/2013  . Renal insufficiency   . Secondary DM with DKA-AG=16, BIcarb Nl 03/24/2014  . Type 2 diabetes mellitus with diabetic nephropathy [facility])     Past Surgical History:  Procedure Laterality Date  . ANKLE FRACTURE SURGERY Right 1988  . AV FISTULA PLACEMENT Left 01/01/2015   Procedure: CREATION OF LEFT RADIAL CEPHALIC ARTERIOVENOUS (AV) FISTULA ;  Surgeon: Mal Misty, MD;  Location: [facility];  Service: Vascular;  Laterality: Left;  . ESOPHAGOGASTRODUODENOSCOPY N/A 03/29/2014   Procedure: ESOPHAGOGASTRODUODENOSCOPY (EGD);  Surgeon: Teena Irani, MD;  Location: [facility];  Service: Endoscopy;  Laterality: N/A;  .  EYE SURGERY    . FRACTURE SURGERY    . INSERTION OF DIALYSIS CATHETER Right 01/05/2015   Procedure: INSERTION OF RIGHT INTERNAL JUGULAR DIALYSIS CATHETER;  Surgeon: Conrad [place], MD;  Location: [facility];  Service: Vascular;  Laterality: Right;  . TONSILLECTOMY AND ADENOIDECTOMY  1970s    Family History  Problem Relation Age of Onset  . Obesity Mother     Patient states that family members have no other medical illnesses other than what I have described  . Kidney cancer Maternal Grandmother   . Cancer Father 23    AML  . Heart disease      No family history   Social History:  reports that he has never smoked. He has never used smokeless tobacco. He reports that he does not drink alcohol or use drugs.  Allergies:  Allergies  Allergen Reactions  . Ibuprofen  Other (See Comments)    MD told him not to take due to kidney disease.  . Nsaids Other (See Comments)    Told to avoid all nsaids due to kidney disease-takes acetaminophen as needed    OUTPATIENT MEDICATIONS: No current facility-administered medications on file prior to encounter.    Current Outpatient Prescriptions on File Prior to Encounter  Medication Sig Dispense Refill  . acetaminophen (TYLENOL) 500 MG tablet Take 1 tablet (500 mg total) by mouth every 6 (six) hours as needed for moderate pain. (Patient taking differently: Take 1,000 mg by mouth every 6 (six) hours as needed for moderate pain. ) 30 tablet 0  . albuterol (PROVENTIL HFA;VENTOLIN HFA) 108 (90 Base) MCG/ACT inhaler Inhale 1 puff into the lungs every 6 (six) hours as needed for wheezing or shortness of breath. 1 Inhaler 11  . albuterol-ipratropium (COMBIVENT) 18-103 MCG/ACT inhaler Inhale 2 puffs into the lungs every 4 (four) hours. (Patient taking differently: Inhale 2 puffs into the lungs every 6 (six) hours as needed for wheezing or shortness of breath. ) 1 Inhaler 11  . amLODipine (NORVASC) 10 MG tablet Take 1 tablet (10 mg total) by mouth daily. 30 tablet 2  . calcium acetate (PHOSLO) 667 MG capsule Take 2 capsules (1,334 mg total) by mouth 3 (three) times daily with meals. 180 capsule 0  . citalopram (CELEXA) 40 MG tablet Take 1.5 tablets (60 mg total) by mouth at bedtime. 90 tablet 3  . clonazePAM (KLONOPIN) 2 MG tablet Take 2 mg by mouth at bedtime.    . cloNIDine (CATAPRES) 0.1 MG tablet Take 1 tablet (0.1 mg total) by mouth 2 (two) times daily. (Patient taking differently: Take 0.2 mg by mouth 2 (two) times daily. ) 60 tablet 0  . cyclobenzaprine (FLEXERIL) 10 MG tablet Take 1 tablet (10 mg total) by mouth at bedtime. 30 tablet 11  . gabapentin (NEURONTIN) 100 MG capsule Take 2 capsules (200 mg total) by mouth 3 (three) times daily. (Patient taking differently: Take 100-200 mg by mouth 4 (four) times daily as needed. )  180 capsule 11  . HYDROmorphone (DILAUDID) 2 MG tablet Take 1 tablet (2 mg total) by mouth every 8 (eight) hours as needed for severe pain. (Patient taking differently: Take 2-4 mg by mouth every 8 (eight) hours as needed for severe pain. ) 90 tablet 0  . hydrOXYzine (ATARAX/VISTARIL) 25 MG tablet Take 25 mg by mouth 2 (two) times daily as needed for anxiety.     . insulin glargine (LANTUS) 100 UNIT/ML injection Inject 60 Units into the skin 2 (two) times daily.    Marland Kitchen  levothyroxine (SYNTHROID, LEVOTHROID) 50 MCG tablet Take 1 tablet (50 mcg total) by mouth daily. 90 tablet 3  . metoCLOPramide (REGLAN) 5 MG tablet Take 1 tablet (5 mg total) by mouth every 8 (eight) hours as needed for nausea. 30 tablet 11  . metoprolol succinate (TOPROL-XL) 100 MG 24 hr tablet Take 1 tablet (100 mg total) by mouth daily. (Patient taking differently: Take 100 mg by mouth at bedtime as needed. BP greater than 170) 90 tablet 3  . promethazine (PHENERGAN) 25 MG tablet Take 1 tablet (25 mg total) by mouth every 8 (eight) hours as needed for nausea. 90 tablet 11  . omeprazole (PRILOSEC) 40 MG capsule Take 1 capsule (40 mg total) by mouth 2 (two) times daily. (Patient not taking: Reported on 09/20/2015) 60 capsule 0  . polyethylene glycol (MIRALAX) packet Take 17 g by mouth daily. (Patient not taking: Reported on 09/20/2015) 14 each 0  . terbinafine (LAMISIL) 250 MG tablet Take 1 tablet (250 mg total) by mouth daily. (Patient not taking: Reported on 09/20/2015) 30 tablet 2   PT. Is no longer on amlodipine except prn, metoprolol or clonidine for 3 months.  Results for orders placed or performed during the hospital encounter of 09/20/15 (from the past 48 hour(s))  CBC     Status: Abnormal   Collection Time: 09/20/15 12:02 PM  Result Value Ref Range   WBC 6.3 4.0 - 10.5 K/uL   RBC 3.33 (L) 4.22 - 5.81 MIL/uL   Hemoglobin 9.3 (L) 13.0 - 17.0 g/dL   HCT 27.6 (L) 39.0 - 52.0 %   MCV 82.9 78.0 - 100.0 fL   MCH 27.9 26.0 - 34.0  pg   MCHC 33.7 30.0 - 36.0 g/dL   RDW 15.5 11.5 - 15.5 %   Platelets 262 150 - 400 K/uL  Comprehensive metabolic panel     Status: Abnormal   Collection Time: 09/20/15 12:02 PM  Result Value Ref Range   Sodium 129 (L) 135 - 145 mmol/L   Potassium 4.5 3.5 - 5.1 mmol/L   Chloride 94 (L) 101 - 111 mmol/L   CO2 24 22 - 32 mmol/L   Glucose, Bld 754 (HH) 65 - 99 mg/dL    Comment: CRITICAL RESULT CALLED TO, READ BACK BY AND VERIFIED WITH: K COBB,RN [id number] WILDERK    BUN 40 (H) 6 - 20 mg/dL   Creatinine, Ser 4.89 (H) 0.61 - 1.24 mg/dL   Calcium 8.2 (L) 8.9 - 10.3 mg/dL   Total Protein 6.5 6.5 - 8.1 g/dL   Albumin 3.1 (L) 3.5 - 5.0 g/dL   AST 22 15 - 41 U/L   ALT 15 (L) 17 - 63 U/L   Alkaline Phosphatase 100 38 - 126 U/L   Total Bilirubin 0.5 0.3 - 1.2 mg/dL   GFR calc non Af Amer 13 (L) >60 mL/min   GFR calc Af Amer 15 (L) >60 mL/min    Comment: (NOTE) The eGFR has been calculated using the CKD EPI equation. This calculation has not been validated in all clinical situations. eGFR's persistently <60 mL/min signify possible Chronic Kidney Disease.    Anion gap 11 5 - 15  I-Stat Troponin, ED (not at [facility])     Status: Abnormal   Collection Time: 09/20/15 12:17 PM  Result Value Ref Range   Troponin i, poc 1.59 (HH) 0.00 - 0.08 ng/mL   Comment NOTIFIED PHYSICIAN    Comment 3  Comment: Due to the release kinetics of cTnI, a negative result within the first hours of the onset of symptoms does not rule out myocardial infarction with certainty. If myocardial infarction is still suspected, repeat the test at appropriate intervals.   CBG monitoring, ED     Status: Abnormal   Collection Time: 09/20/15  1:58 PM  Result Value Ref Range   Glucose-Capillary >600 (HH) 65 - 99 mg/dL   Dg Chest 2 View  Result Date: 09/20/2015 CLINICAL DATA:  Chest pain and shortness of breath. EXAM: CHEST  2 VIEW COMPARISON:  07/19/2015 FINDINGS: The heart size and mediastinal contours are  within normal limits. Both lungs are clear. The visualized skeletal structures are unremarkable. IMPRESSION: No active cardiopulmonary disease. Electronically Signed   By: Misty Stanley M.D.   On: 09/20/2015 13:23   NUC study 06/2014 : Nuclear Stress Findings   Isotope administration The isotope used for nuclear imaging was [id number]. IV Site: right forearm.Rest isotope was administered on 06/03/2014 at 07:35 with an IV injection of 10.9 mCi. Rest SPECT images were obtained approximately 45 minutes post tracer injection. Stress isotope was administered on 06/03/2014 at 08:50 with an IV injection of 31 mCi 30 seconds post IV Lexiscan administration. Stress SPECT images were obtained approximately 60 minutes post tracer injection.    Nuclear Study Quality There is no nuclear artifact present. Overall image quality is excellent.    Nuclear Measurements Study was gated.    Rest Perfusion There is a defect present in the apical septal location.    Stress Perfusion There is a defect present in the mid anteroseptal and apical septal location.    Perfusion Summary Defect 1:  There is a small defect of mild severity. The defect is non-reversible.       ROS: General:no colds or fevers, some weight increase per hospital record Skin:no rashes or ulcers HEENT:no blurred vision, no congestion CV:see HPI PUL:see HPI GI:no diarrhea constipation or melena, no indigestion GU:Hx hematuria, no dysuria MS:no joint pain, no claudication Neuro:no syncope, no lightheadedness, though when he bends over it feels as if he could pass out. Endo:+ diabetes usually controlled per pt., + thyroid disease   Blood pressure (!) 184/102, pulse 101, temperature 98.4 F (36.9 C), temperature source Oral, resp. rate 21, height _0  (1.88 m), weight 264 lb 9 oz (120 kg), SpO2 98 %.  Wt Readings from Last 3 Encounters:  09/20/15 264 lb 9 oz (120 kg)  07/21/15 261 lb 4.8 oz (118.5 kg)  05/22/15 261 lb (118.4 kg)     PPY:PPJKDTO:IZTIWPYKaffect, obviously not feeling well but able to answer questions  Skin:Warm and dry, brisk capillary refill HEENT:normocephalic, sclera clear, mucus membranes moist Neck:supple, no JVD, no bruits  Heart:S1S2 RRR without murmur, gallup, rub or click Lungs:clear without rales, rhonchi, or wheezes ADXI:PJASN soft, + tenderness diffuse to palpation, + BS, do not palpate liver spleen or masses Ext:no lower ext edema, 2+ pedal pulses, 2+ radial pulses Neuro:alert and oriented X 3, MAE, follows commands, + facial symmetry    Assessment/Plan Abnormal troponin with hypertension and hypotension and glucose > 700., may be demand ischemia. But will need cardiac eval.  Possible cardiac cath. But may need to stabilize glucose first unless symptoms progress.  - check lipids.  - check Echo -serial troponins - IV heparin until Dx is clear.   Chest pain since Friday with elevated troponin,  - add IV heparin and possible NTG if BP climbs again or chest pain  continues.    EKG without acute changes.  Neg troponin 06/2014. But risk factors with DM, HTN  Will recheck EKG   HTN per [facility]  Uncontrolled DM per [facility] on insulin drip  ESRD on HD  MWF    Cecilie Kicks  Nurse Practitioner [facility]ified [place] Pager [phone] or after 5pm or weekends call [phone] 09/20/2015, 3:15 PM   Patient seen and examined. Agree with assessment and plan.  Mr. Jaymison Luber is a 45 year old Caucasian male who has a history of a horseshoe kidney, hypertension, and premature family history for CAD.  In May 2016 a n echo Doppler study showed normal LV function with moderate left ventricular hypertrophy.  Last year he developed nephrotic syndrome and ultimately initiated dialysis in December 2016.  Over the past year and a half he admits to greater than a 60 pound weight gain.  He underwent a nuclear perfusion study last year which was low risk and showed probable apical thinning  without significant ischemia.  He has recently developed sensation of chest discomfort which feels like there is a churning motion in his chest.  He admits to difficulty sleeping on his back and snores loudly on his back.  He does note some nocturnal dyspnea.  He states last week he had a aproximately 7 L removed during dialysis and he has not felt well since.  He has continued to experience recurrent episodes of this chest discomfort.  Today he experience increasing episodes of chest pain during dialysis leading to his presentation.  Initial troponin was elevated at 1.59.  His ECG showed poor progression anteriorly V1 through V4 with precordial Q waves.  His blood sugar was markedly elevated at 754 and he had a normal anion gap.  His physical exam is notable that he is significantly hypertensive with blood pressure 184/102.  He is in sinus rhythm but tachycardic at 100 bpm.  HEENT is  notable for anicteric sclera.  He had a thick neck.Mallinpatti scale 3.  His lungs were clear without rales.  He did not have chest wall tenderness.  Rhythm was regular but tachycardic with a 1/6 systolic murmur; there was no diastolic murmur.  Abdomen was obese without hepatosplenomegaly.  There is no significant LE edema.  Neurologic exam was grossly nonfocal.  He is now on an insulin drip.  Serial troponins will be measured.  I would recommend a follow-up echo Doppler study to reassess LV function.  If his troponin curve is significantly increasing, definitive cardiac catheterization will be recommended.  At present, would heparinize with his positive troponin level.  Consider IV nitroglycerin with significant hypertension. Would resume amlodipine at 5 mg and beta blocker therapy with metoprolol, tartrate initially at 50 mg twice a day and titrate as BP allows.  With the patient's greater than 60 pound wake gain over the past year, loud snoring in the supine posture, frequent awakenings with nonrestorative sleep and body habitus  I  would suggest an outpatient evaluation for obstructive sleep apnea following his hospitalization.  Will follow the patient with you.   Troy Sine, MD, [facility] 09/20/2015 4:14 PM

## 2015-09-20 NOTE — ED Notes (Signed)
Admitting at bedside 

## 2015-09-20 NOTE — ED Notes (Signed)
Pt states that he started his dialysis treatment today but did not finish it due to his nausea and "feeling so bad".

## 2015-09-20 NOTE — ED Triage Notes (Signed)
Pt reports to the ED for eval of abd pain, N/V, HTN, and HA. He is a dialysis patient and has been having HTN episodes (200s/100s since yesterday). He reports he was on BP medication but he was taken off it bc his MD told him to d/c it. Pt was on amlodipine. Symptoms began yesterday as well. He is a HD pt and went to treatment today but he was only on for 2 hours before they had to take him off. Took a Reglan and Phenergan PTA which helped with the nausea.

## 2015-09-20 NOTE — Progress Notes (Addendum)
ANTICOAGULATION CONSULT NOTE - Initial Consult  Pharmacy Consult for heparin Indication: chest pain/ACS  Allergies  Allergen Reactions  . Ibuprofen Other (See Comments)    MD told him not to take due to kidney disease.  . Nsaids Other (See Comments)    Told to avoid all nsaids due to kidney disease-takes acetaminophen as needed    Patient Measurements: Height: 6\' 2"  (188 cm) Weight: 264 lb 9 oz (120 kg) IBW/kg (Calculated) : 82.2 Heparin Dosing Weight: 108 kg   Vital Signs: Temp: 98.4 F (36.9 C) (09/18 1158) Temp Source: Oral (09/18 1158) BP: 158/93 (09/18 1500) Pulse Rate: 103 (09/18 1500)  Labs:  Recent Labs  09/20/15 1202  HGB 9.3*  HCT 27.6*  PLT 262  CREATININE 4.89*    Estimated Creatinine Clearance: 26.5 mL/min (by C-G formula based on SCr of 4.89 mg/dL (H)).   Medical History: Past Medical History:  Diagnosis Date  . Age-related macular degeneration, wet, both eyes ([facility])    "I'm getting Alia treatments" (12/30/2014)  . Anxiety   . Arthritis    "hands" (12/30/2014)  . Asthma   . 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 Daily headache   . Depression   . DVT (deep venous thrombosis), H/o 01/2014-on Xarelto 03/24/2014   LLE  . Hypertension   . Hypothyroidism   . Nephrotic syndrome 05/18/2014  . Pneumonia 11/2013  . Renal insufficiency   . Secondary DM with DKA-AG=16, BIcarb Nl 03/24/2014  . Type 2 diabetes mellitus with diabetic nephropathy [facility](HCC)    Assessment: 45 yo male admitted with multiple complaints - to include chest pain. No cardiac history, no PTA oral anticoagulation. Troponin elevated 1.59. Hgb and Hct slightly low. No signs or symptoms of bleeding noted.   Goal of Therapy:  Heparin level 0.3-0.7 units/ml Monitor platelets by anticoagulation protocol: Yes   Plan:  Give 4000 units bolus x 1 Start heparin infusion at 1500 units/hr Check anti-Xa level in 8 hours and daily while on heparin Continue to monitor H&H and  platelets  York CeriseKatherine Cook, PharmD Pharmacy Resident  Pager [phone] 09/20/15 4:15 PM

## 2015-09-20 NOTE — ED Notes (Signed)
Report given to Kathleen

## 2015-09-20 NOTE — ED Provider Notes (Signed)
[facility]-EMERGENCY DEPT Provider Note   CSN: [id number] Arrival date & time: 09/20/15  1115     History   Chief Complaint Chief Complaint  Patient presents with  . Emesis  . Headache  . Abdominal Pain    HPI Russell [name] is a 45 y.o. male.  HPI  Patient presents with multiple concerns. He notes that over the past 24 hours he has developed pain in his head, chest, as well as nausea, weakness, generalized discomfort. He states that he was generally well prior to this. Patient went to dialysis today, tolerated only half session before feeling too ill to continue. He denies fevers, chills. He states that he takes all medication as directed, including insulin, daily. No recent hospitalizations. There is associated lightheadedness, but no syncope. The chest pain is diffuse, anterior, sore. Headache is sharp, severe, squeezing, bilateral. No vision changes, no confusion, disorientation.   Past Medical History:  Diagnosis Date  . Age-related macular degeneration, wet, both eyes (HCC)    "I'm getting Alia treatments" (12/30/2014)  . Anxiety   . Arthritis    "hands" (12/30/2014)  . Asthma   . 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 Daily headache   . Depression   . DVT (deep venous thrombosis), H/o 01/2014-on Xarelto 03/24/2014   LLE  . Hypertension   . Hypothyroidism   . Nephrotic syndrome 05/18/2014  . Pneumonia 11/2013  . Renal insufficiency   . Secondary DM with DKA-AG=16, BIcarb Nl 03/24/2014  . Type 2 diabetes mellitus with diabetic nephropathy [facility](HCC)     Patient Active Problem List   Diagnosis Date Noted  . Angina pectoris (HCC) 07/20/2015  . Chest pain 07/20/2015  . Hyperglycemia 07/20/2015  . DKA (diabetic ketoacidoses) (HCC) 02/13/2015  . ESRD on dialysis (HCC) 02/13/2015  . Nausea and vomiting 02/13/2015  . Nephrotic syndrome 01/03/2015  . Hypertensive urgency 12/30/2014  . Essential hypertension, benign   . Thyroid activity decreased   .  Hx of gastroesophageal reflux (GERD) 09/30/2014  . Prolonged Q-T interval on ECG 09/30/2014  . Acute on chronic renal failure (HCC) 09/24/2014  . Diabetic neuropathy (HCC) 09/21/2014  . Uncontrolled diabetes mellitus type 2 with peripheral artery disease (HCC) 09/21/2014  . Morbid obesity (HCC) 09/21/2014  . Superficial thrombophlebitis 03/24/2014  . Hypothyroidism (acquired) 03/24/2014    Past Surgical History:  Procedure Laterality Date  . ANKLE FRACTURE SURGERY Right 1988  . AV FISTULA PLACEMENT Left 01/01/2015   Procedure: CREATION OF LEFT RADIAL CEPHALIC ARTERIOVENOUS (AV) FISTULA ;  Surgeon: Pryor OchoaJames D Lawson, MD;  [facility];  Service: Vascular;  Laterality: Left;  . ESOPHAGOGASTRODUODENOSCOPY N/A 03/29/2014   Procedure: ESOPHAGOGASTRODUODENOSCOPY (EGD);  Surgeon: Dorena CookeyJohn Hayes, MD;  [facility];  Service: Endoscopy;  Laterality: N/A;  . EYE SURGERY    . FRACTURE SURGERY    . INSERTION OF DIALYSIS CATHETER Right 01/05/2015   Procedure: INSERTION OF RIGHT INTERNAL JUGULAR DIALYSIS CATHETER;  Surgeon: Fransisco HertzBrian L Chen, MD;  [facility];  Service: Vascular;  Laterality: Right;  . TONSILLECTOMY AND ADENOIDECTOMY  1970s       Home Medications    Prior to Admission medications   Medication Sig Start Date End Date Taking? Authorizing Provider  ACCU-CHEK AVIVA PLUS test strip USE 6-8 TIMES A DAY 08/02/15   Carlus Pavlovristina Gherghe, MD  acetaminophen (TYLENOL) 500 MG tablet Take 1 tablet (500 mg total) by mouth every 6 (six) hours as needed for moderate pain. 09/29/14  Albertine Grates, MD  albuterol (PROVENTIL HFA;VENTOLIN HFA) 108 (90 Base) MCG/ACT inhaler Inhale 1 puff into the lungs every 6 (six) hours as needed for wheezing or shortness of breath. 03/11/15   [name], MD  albuterol-ipratropium (COMBIVENT) 18-103 MCG/ACT inhaler Inhale 2 puffs into the lungs every 4 (four) hours. Patient taking differently: Inhale 2 puffs into the lungs every 6 (six) hours as needed for wheezing or  shortness of breath.  09/03/14   [name], MD  amLODipine (NORVASC) 10 MG tablet Take 1 tablet (10 mg total) by mouth daily. 05/22/15   [name], MD  buPROPion (WELLBUTRIN) 100 MG tablet Take 100 mg by mouth daily.    Historical Provider, MD  calcium acetate (PHOSLO) 667 MG capsule Take 2 capsules (1,334 mg total) by mouth 3 (three) times daily with meals. 01/08/15   Rolly Salter, MD  citalopram (CELEXA) 40 MG tablet Take 1.5 tablets (60 mg total) by mouth at bedtime. 05/22/15   [name], MD  clonazePAM (KLONOPIN) 2 MG tablet Take 2 mg by mouth at bedtime.    Historical Provider, MD  cloNIDine (CATAPRES) 0.1 MG tablet Take 1 tablet (0.1 mg total) by mouth 2 (two) times daily. Patient taking differently: Take 0.2 mg by mouth 2 (two) times daily.  01/08/15   Rolly Salter, MD  cyclobenzaprine (FLEXERIL) 10 MG tablet Take 1 tablet (10 mg total) by mouth at bedtime. 05/22/15   [name], MD  gabapentin (NEURONTIN) 100 MG capsule Take 2 capsules (200 mg total) by mouth 3 (three) times daily. 05/22/15   [name], MD  HUMALOG KWIKPEN 200 UNIT/ML SOPN INJECT 33 UNITS INTO THE SKIN 3 (THREE) TIMES DAILY BEFORE MEALS. 08/02/15   Carlus Pavlov, MD  HYDROmorphone (DILAUDID) 2 MG tablet Take 1 tablet (2 mg total) by mouth every 8 (eight) hours as needed for severe pain. 05/22/15   [name], MD  hydrOXYzine (ATARAX/VISTARIL) 25 MG tablet Take 25 mg by mouth 2 (two) times daily as needed for anxiety.     Historical Provider, MD  insulin glargine (LANTUS) 100 UNIT/ML injection Inject 60 Units into the skin 2 (two) times daily.    Historical Provider, MD  levothyroxine (SYNTHROID, LEVOTHROID) 50 MCG tablet Take 1 tablet (50 mcg total) by mouth daily. 05/22/15   [name], MD  metoCLOPramide (REGLAN) 5 MG tablet Take 1 tablet (5 mg total) by mouth every 8 (eight) hours as needed for nausea. 05/22/15   [name], MD  metoprolol succinate (TOPROL-XL) 100 MG 24 hr tablet Take  1 tablet (100 mg total) by mouth daily. Patient taking differently: Take 100 mg by mouth at bedtime.  05/22/15   [name], MD  mycophenolate (CELLCEPT) 500 MG tablet Take 500 mg by mouth daily. 12/28/14   Historical Provider, MD  omeprazole (PRILOSEC) 40 MG capsule Take 1 capsule (40 mg total) by mouth 2 (two) times daily. 07/20/15   Srikar Sudini, MD  polyethylene glycol (MIRALAX) packet Take 17 g by mouth daily. 07/20/15   Milagros Loll, MD  promethazine (PHENERGAN) 25 MG tablet Take 1 tablet (25 mg total) by mouth every 8 (eight) hours as needed for nausea. 05/22/15   [name], MD  sucralfate (CARAFATE) 1 GM/10ML suspension Take 1 g by mouth daily as needed (for throat irritation).     Historical Provider, MD  terbinafine (LAMISIL) 250 MG tablet Take 1 tablet (250 mg total) by mouth daily. 05/22/15   [name], MD    Family History Family  History  Problem Relation Age of Onset  . Obesity Mother     Patient states that family members have no other medical illnesses other than what I have described  . Kidney cancer Maternal Grandmother   . Cancer Father 50    AML  . Heart disease      No family history    Social History Social History  Substance Use Topics  . Smoking status: Never Smoker  . Smokeless tobacco: Never Used  . Alcohol use No     Allergies   Ibuprofen and Nsaids   Review of Systems Review of Systems  Constitutional:       Per HPI, otherwise negative  HENT:       Per HPI, otherwise negative  Respiratory:       Per HPI, otherwise negative  Cardiovascular:       Per HPI, otherwise negative  Gastrointestinal: Positive for nausea. Negative for vomiting.  Endocrine:       Negative aside from HPI  Genitourinary:       Neg aside from HPI   Musculoskeletal:       Per HPI, otherwise negative  Skin: Negative.   Neurological: Positive for weakness, light-headedness and headaches. Negative for syncope and facial asymmetry.     Physical  Exam Updated Vital Signs BP 167/98   Pulse 107   Temp 98.4 F (36.9 C) (Oral)   Resp 16   SpO2 100%   Physical Exam  Constitutional: He is oriented to person, place, and time. He appears well-developed. No distress.  HENT:  Head: Normocephalic and atraumatic.  Eyes: Conjunctivae and EOM are normal.  Cardiovascular: Regular rhythm.  Tachycardia present.   Pulmonary/Chest: Effort normal. No stridor. No respiratory distress.  Abdominal: He exhibits no distension.  Musculoskeletal: He exhibits no edema.  Neurological: He is alert and oriented to person, place, and time. He displays no tremor. No cranial nerve deficit. He exhibits normal muscle tone. Coordination normal.  Skin: Skin is warm and dry.  Psychiatric: He has a normal mood and affect.  Nursing note and vitals reviewed.    ED Treatments / Results  Labs (all labs ordered are listed, but only abnormal results are displayed) Labs Reviewed  CBC - Abnormal; Notable for the following:       Result Value   RBC 3.33 (*)    Hemoglobin 9.3 (*)    HCT 27.6 (*)    All other components within normal limits  COMPREHENSIVE METABOLIC PANEL - Abnormal; Notable for the following:    Sodium 129 (*)    Chloride 94 (*)    Glucose, Bld 754 (*)    BUN 40 (*)    Creatinine, Ser 4.89 (*)    Calcium 8.2 (*)    Albumin 3.1 (*)    ALT 15 (*)    GFR calc non Af Amer 13 (*)    GFR calc Af Amer 15 (*)    All other components within normal limits  I-STAT TROPOININ, ED - Abnormal; Notable for the following:    Troponin i, poc 1.59 (*)    All other components within normal limits  CBG MONITORING, ED - Abnormal; Notable for the following:    Glucose-Capillary >600 (*)    All other components within normal limits  CBG MONITORING, ED - Abnormal; Notable for the following:    Glucose-Capillary >600 (*)    All other components within normal limits  BLOOD GAS, ARTERIAL  URINALYSIS, ROUTINE W REFLEX MICROSCOPIC (NOT AT  [facility])  TROPONIN I   PROTIME-INR  APTT  I-STAT TROPOININ, ED    EKG  EKG Interpretation  Date/Time:  Monday September 20 2015 12:05:54 EDT Ventricular Rate:  109 PR Interval:  150 QRS Duration: 76 QT Interval:  370 QTC Calculation: 498 R Axis:   12 Text Interpretation:  Sinus tachycardia Anterior infarct , age undetermined Non-specific intra-ventricular conduction delay ST-t wave abnormality Abnormal ekg Confirmed by Gerhard Munch  MD [phone]) on 09/20/2015 3:19:28 PM       Radiology Dg Chest 2 View  Result Date: 09/20/2015 CLINICAL DATA:  Chest pain and shortness of breath. EXAM: CHEST  2 VIEW COMPARISON:  07/19/2015 FINDINGS: The heart size and mediastinal contours are within normal limits. Both lungs are clear. The visualized skeletal structures are unremarkable. IMPRESSION: No active cardiopulmonary disease. Electronically Signed   By: Kennith Center M.D.   On: 09/20/2015 13:23    Procedures Procedures (including critical care time)  Medications Ordered in ED Medications  dextrose 5 %-0.45 % sodium chloride infusion (not administered)  insulin regular (NOVOLIN R,HUMULIN R) 250 Units in sodium chloride 0.9 % 250 mL (1 Units/mL) infusion (not administered)  0.9 %  sodium chloride infusion (125 mL/hr Intravenous New Bag/Given 09/20/15 1338)     Initial Impression / Assessment and Plan / ED Course  I have reviewed the triage vital signs and the nursing notes.  Pertinent labs & imaging results that were available during my care of the patient were reviewed by me and considered in my medical decision making (see chart for details).  Clinical Course   Initial labs notable for glucose greater than 700. With concern for DKA the patient received empiric IV fluids. Subsequently, the patient was unable anion gap approximately 20, corrected for his hyponatremia. Patient also was started on insulin drip.  Subsequent, the patient was found to have elevated troponin, 1.6. I discussed this with our  cardiology team who will assist with evaluation of the patient The patient again confirms that he has no history of cardiac disease.   Update: After initiation of insulin drip the patient's glucose is still greater than 600. I discussed patient's case again with our cardiology team, they're considering initiation of heparin drip.   Final Clinical Impressions(s) / ED Diagnoses   Final diagnoses:  NSTEMI (non-ST elevated myocardial infarction) (HCC)  Diabetic ketoacidosis without coma associated with type 1 diabetes mellitus (HCC)   Patient with history of insulin dependent diabetes presents with at least one day of ongoing multiple complaints, including headache, chest pain, nausea, weakness. Here the patient is initially found to have hyperglycemia, anion gap, and subsequent is found to have elevated troponin, concerning for NSTEMI.  Patient received empiric fluids, subsequently was placed on an insulin drip. Discussed patient's case with his primary care team, as well as with our cardiology colleagues. Patient required admission to the stepdown unit for further evaluation and management.  CRITICAL CARE Performed by: Gerhard Munch Total critical care time: 40 minutes Critical care time was exclusive of separately billable procedures and treating other patients. Critical care was necessary to treat or prevent imminent or life-threatening deterioration. Critical care was time spent personally by me on the following activities: development of treatment plan with patient and/or surrogate as well as nursing, discussions with consultants, evaluation of patient's response to treatment, examination of patient, obtaining history from patient or surrogate, ordering and performing treatments and interventions, ordering and review of laboratory studies, ordering and review of radiographic studies, pulse oximetry and re-evaluation  of patient's condition.    Gerhard Munch, MD 09/20/15 [phone]

## 2015-09-20 NOTE — Progress Notes (Signed)
Pt admitted to 2c14. CHG bath completed.

## 2015-09-20 NOTE — ED Notes (Signed)
Dr.Lockwood at bedside  

## 2015-09-20 NOTE — ED Notes (Signed)
Admit provider at bedside 

## 2015-09-21 ENCOUNTER — Inpatient Hospital Stay (HOSPITAL_COMMUNITY): Payer: Medicare Other

## 2015-09-21 DIAGNOSIS — E785 Hyperlipidemia, unspecified: Secondary | ICD-10-CM

## 2015-09-21 DIAGNOSIS — R079 Chest pain, unspecified: Secondary | ICD-10-CM

## 2015-09-21 DIAGNOSIS — Z794 Long term (current) use of insulin: Secondary | ICD-10-CM

## 2015-09-21 LAB — BASIC METABOLIC PANEL
ANION GAP: 12 (ref 5–15)
ANION GAP: 12 (ref 5–15)
BUN: 47 mg/dL — ABNORMAL HIGH (ref 6–20)
BUN: 48 mg/dL — AB (ref 6–20)
CALCIUM: 8.6 mg/dL — AB (ref 8.9–10.3)
CALCIUM: 8.4 mg/dL — AB (ref 8.9–10.3)
CO2: 23 mmol/L (ref 22–32)
CO2: 24 mmol/L (ref 22–32)
Chloride: 101 mmol/L (ref 101–111)
Chloride: 100 mmol/L — ABNORMAL LOW (ref 101–111)
Creatinine, Ser: 5.67 mg/dL — ABNORMAL HIGH (ref 0.61–1.24)
Creatinine, Ser: 5.89 mg/dL — ABNORMAL HIGH (ref 0.61–1.24)
GFR calc Af Amer: 13 mL/min — ABNORMAL LOW (ref >60)
GFR calc Af Amer: 12 mL/min — ABNORMAL LOW (ref >60)
GFR, EST NON AFRICAN AMERICAN: 11 mL/min — AB (ref >60)
GFR, EST NON AFRICAN AMERICAN: 11 mL/min — AB (ref >60)
GLUCOSE: 197 mg/dL — AB (ref 65–99)
GLUCOSE: 209 mg/dL — AB (ref 65–99)
Potassium: 4 mmol/L (ref 3.5–5.1)
Potassium: 3.8 mmol/L (ref 3.5–5.1)
SODIUM: 136 mmol/L (ref 135–145)
Sodium: 136 mmol/L (ref 135–145)

## 2015-09-21 LAB — GLUCOSE, CAPILLARY
GLUCOSE-CAPILLARY: 124 mg/dL — AB (ref 65–99)
GLUCOSE-CAPILLARY: 198 mg/dL — AB (ref 65–99)
GLUCOSE-CAPILLARY: 233 mg/dL — AB (ref 65–99)
GLUCOSE-CAPILLARY: 264 mg/dL — AB (ref 65–99)
GLUCOSE-CAPILLARY: 281 mg/dL — AB (ref 65–99)
Glucose-Capillary: 190 mg/dL — ABNORMAL HIGH (ref 65–99)

## 2015-09-21 LAB — LIPID PANEL
CHOL/HDL RATIO: 6.2 ratio
Cholesterol: 203 mg/dL — ABNORMAL HIGH (ref 0–200)
HDL: 33 mg/dL — ABNORMAL LOW (ref >40)
LDL Cholesterol: 111 mg/dL — ABNORMAL HIGH (ref 0–99)
Triglycerides: 294 mg/dL — ABNORMAL HIGH (ref <150)
VLDL: 59 mg/dL — ABNORMAL HIGH (ref 0–40)

## 2015-09-21 LAB — ECHOCARDIOGRAM COMPLETE
HEIGHTINCHES: 74 in
WEIGHTICAEL: 4233 [oz_av]

## 2015-09-21 LAB — CBC
HEMATOCRIT: 28.8 % — AB (ref 39.0–52.0)
Hemoglobin: 9.4 g/dL — ABNORMAL LOW (ref 13.0–17.0)
MCH: 27.5 pg (ref 26.0–34.0)
MCHC: 32.6 g/dL (ref 30.0–36.0)
MCV: 84.2 fL (ref 78.0–100.0)
PLATELETS: 290 10*3/uL (ref 150–400)
RBC: 3.42 MIL/uL — ABNORMAL LOW (ref 4.22–5.81)
RDW: 16.2 % — AB (ref 11.5–15.5)
WBC: 7 10*3/uL (ref 4.0–10.5)

## 2015-09-21 LAB — PROTIME-INR
INR: 1.09
PROTHROMBIN TIME: 14.2 s (ref 11.4–15.2)

## 2015-09-21 LAB — HEPARIN LEVEL (UNFRACTIONATED)
HEPARIN UNFRACTIONATED: 0.5 [IU]/mL (ref 0.30–0.70)
HEPARIN UNFRACTIONATED: 0.29 [IU]/mL — AB (ref 0.30–0.70)

## 2015-09-21 LAB — TROPONIN I: Troponin I: 1.68 ng/mL (ref <0.03)

## 2015-09-21 MED ORDER — SODIUM CHLORIDE 0.9 % WEIGHT BASED INFUSION: 3.0000 mL/kg/h | INTRAVENOUS | Status: DC

## 2015-09-21 MED ORDER — NA FERRIC GLUC CPLX IN SUCROSE 12.5 MG/ML IV SOLN
62.5000 mg | Freq: Once | INTRAVENOUS | Status: DC
  Filled 2015-09-21: qty 5

## 2015-09-21 MED ORDER — PERFLUTREN LIPID MICROSPHERE
1.0000 mL | INTRAVENOUS | Status: AC | PRN
  Administered 2015-09-21: 2 mL via INTRAVENOUS
  Filled 2015-09-21: qty 10

## 2015-09-21 MED ORDER — METOPROLOL TARTRATE 25 MG PO TABS
75.0000 mg | ORAL_TABLET | Freq: Two times a day (BID) | ORAL | Status: DC
Start: 2015-09-21 — End: 2015-09-23
  Administered 2015-09-21 – 2015-09-23 (×4): 75 mg via ORAL
  Filled 2015-09-21: qty 3
  Filled 2015-09-21: qty 1
  Filled 2015-09-21: qty 3
  Filled 2015-09-21: qty 1

## 2015-09-21 MED ORDER — OMEGA-3-ACID ETHYL ESTERS 1 G PO CAPS
2.0000 g | ORAL_CAPSULE | Freq: Two times a day (BID) | ORAL | Status: DC
  Administered 2015-09-21 – 2015-09-27 (×14): 2 g via ORAL
  Filled 2015-09-21 (×14): qty 2

## 2015-09-21 MED ORDER — SODIUM CHLORIDE 0.9% FLUSH
3.0000 mL | Freq: Two times a day (BID) | INTRAVENOUS | Status: DC
  Administered 2015-09-21: 3 mL via INTRAVENOUS

## 2015-09-21 MED ORDER — ATORVASTATIN CALCIUM 80 MG PO TABS
80.0000 mg | ORAL_TABLET | Freq: Every day | ORAL | Status: DC
  Administered 2015-09-21 – 2015-10-04 (×12): 80 mg via ORAL
  Filled 2015-09-21 (×13): qty 1

## 2015-09-21 MED ORDER — CALCITRIOL 0.25 MCG PO CAPS
0.2500 ug | ORAL_CAPSULE | ORAL | Status: DC
  Administered 2015-09-22 – 2015-10-04 (×3): 0.25 ug via ORAL
  Filled 2015-09-21 (×6): qty 1

## 2015-09-21 MED ORDER — SODIUM CHLORIDE 0.9 % WEIGHT BASED INFUSION: 1.0000 mL/kg/h | INTRAVENOUS | Status: DC

## 2015-09-21 MED ORDER — SODIUM CHLORIDE 0.9% FLUSH: 3.0000 mL | INTRAVENOUS | Status: DC | PRN

## 2015-09-21 MED ORDER — SODIUM CHLORIDE 0.9 % IV SOLN: 250.0000 mL | INTRAVENOUS | Status: DC | PRN

## 2015-09-21 NOTE — Progress Notes (Signed)
Patient requesting diet order change and additional pain medications. Notified physician

## 2015-09-21 NOTE — Progress Notes (Signed)
  Echocardiogram 2D Echocardiogram with Definity has been performed.  Cathie BeamsGREGORY, Zvi Duplantis 09/21/2015, 3:55 PM

## 2015-09-21 NOTE — Progress Notes (Addendum)
Inpatient Diabetes Program Recommendations  AACE/ADA: New Consensus Statement on Inpatient Glycemic Control (2015)  Target Ranges:  Prepandial:   less than 140 mg/dL      Peak postprandial:   less than 180 mg/dL (1-2 hours)      Critically ill patients:  140 - 180 mg/dL   Lab Results  Component Value Date   GLUCAP 198 (H) 09/21/2015   HGBA1C 16.8 (H) 07/20/2015    Review of Glycemic Control:  Results for Russell [name], Russell [name] (MRN [id number]) as of 09/21/2015 11:26  Ref. Range 09/20/2015 22:17 09/20/2015 23:24 09/21/2015 00:35 09/21/2015 03:10 09/21/2015 07:44  Glucose-Capillary Latest Ref Range: 65 - 99 mg/dL 478139 (H) 295115 (H) 621124 (H) 190 (H) 198 (H)   Diabetes history: Type 2 diabetes with ESRD Outpatient Diabetes medications:  Lantus 60 units bid, Humalog 15-30 units (1 units for every 5 grams of CHO with evening meal) Current orders for Inpatient glycemic control: Lantus 32 units q HS, Novolog moderate tid with meals  Inpatient Diabetes Program Recommendations:    Consider increasing Lantus to 25 units bid. Also consider adding Novolog meal coverage 4 units tid with meals.    Thanks, Beryl MeagerJenny Jakiah Goree, RN, BC-ADM Inpatient Diabetes Coordinator Pager [phone] (8a-5p)

## 2015-09-21 NOTE — Progress Notes (Signed)
With heparin level of 0.50 pharmacy was informed.

## 2015-09-21 NOTE — Progress Notes (Signed)
Subjective:  No recurrent chest pain  Objective:   Vital Signs : Vitals:   09/21/15 0305 09/21/15 0310 09/21/15 0806 09/21/15 0943  BP:  (!) 156/95 (!) 152/92 131/67  Pulse:      Resp: 11     Temp: 97.8 F (36.6 C)  97.5 F (36.4 C)   TempSrc: Oral  Oral   SpO2: 100%     Weight:      Height:        Intake/Output from previous day:  Intake/Output Summary (Last 24 hours) at 09/21/15 1037 Last data filed at 09/21/15 0806  Gross per 24 hour  Intake           392.62 ml  Output              450 ml  Net           -57.38 ml    I/O since admission: -57  Wt Readings from Last 3 Encounters:  09/20/15 264 lb 9 oz (120 kg)  07/21/15 261 lb 4.8 oz (118.5 kg)  05/22/15 261 lb (118.4 kg)    Medications: . amLODipine  5 mg Oral Daily  . calcium acetate  1,334 mg Oral TID WC  . citalopram  60 mg Oral QHS  . clonazePAM  2 mg Oral QHS  . gi cocktail  30 mL Oral Once  . insulin aspart  0-15 Units Subcutaneous TID WC  . insulin glargine  32 Units Subcutaneous QHS  . levothyroxine  50 mcg Oral QAC breakfast  . metoprolol tartrate  50 mg Oral BID    . heparin 1,650 Units/hr (09/21/15 0960)    Physical Exam:  BP 131/67   Pulse (!) 106   Temp 97.5 F (36.4 C) (Oral)   Resp 11   Ht 6' 2"  (1.88 m)   Wt 264 lb 9 oz (120 kg)   SpO2 100%   BMI 33.97 kg/m  General appearance: alert, cooperative and no distress Neck: no adenopathy, no carotid bruit, no JVD, supple, symmetrical, trachea midline and thyroid not enlarged, symmetric, no tenderness/mass/nodules Lungs: clear to auscultation bilaterally Heart: regular rate and rhythm and 1/6 sem, no s3 gallop, no rub Abdomen: soft, non-tender; bowel sounds normal; no masses,  no organomegaly Extremities: no edema, redness or tenderness in the calves or thighs Pulses: 2+ and symmetric Neurologic: Peripheral neuropathy; otherwise, grossly normal   Rate: 90  Rhythm: normal sinus rhythm  ECG (independently read by me): ST at 109;  QS V1-4Will further titrate metoprolol  Lab Results:   Recent Labs  09/20/15 2201 09/21/15 0245 09/21/15 0623  NA 137 136 136  K 3.9 4.0 3.8  CL 102 101 100*  CO2 25 23 24   GLUCOSE 160* 197* 209*  BUN 44* 47* 48*  CREATININE 5.39* 5.67* 5.89*  CALCIUM 8.5* 8.6* 8.4*    Hepatic Function Latest Ref Rng & Units 09/20/2015 07/21/2015 07/19/2015  Total Protein 6.5 - 8.1 g/dL 6.5 - 7.4  Albumin 3.5 - 5.0 g/dL 3.1(L) 3.1(L) 3.6  AST 15 - 41 U/L 22 - 17  ALT 17 - 63 U/L 15(L) - 17  Alk Phosphatase 38 - 126 U/L 100 - 100  Total Bilirubin 0.3 - 1.2 mg/dL 0.5 - 0.8  Bilirubin, Direct 0.1 - 0.5 mg/dL - - <0.1(L)     Recent Labs  09/20/15 1202 09/21/15 0245  WBC 6.3 7.0  HGB 9.3* 9.4*  HCT 27.6* 28.8*  MCV 82.9 84.2  PLT 262 290  Recent Labs  09/20/15 1855 09/20/15 2201 09/21/15 0027  TROPONINI 2.10* 1.83* 1.68*    Lab Results  Component Value Date   TSH 1.243 07/20/2015   No results for input(s): HGBA1C in the last 72 hours.   Recent Labs  09/20/15 1202  PROT 6.5  ALBUMIN 3.1*  AST 22  ALT 15*  ALKPHOS 100  BILITOT 0.5    Recent Labs  09/21/15 0245  INR 1.09   BNP (last 3 results) No results for input(s): BNP in the last 8760 hours.  ProBNP (last 3 results) No results for input(s): PROBNP in the last 8760 hours.   Lipid Panel     Component Value Date/Time   CHOL 203 (H) 09/21/2015 0245   TRIG 294 (H) 09/21/2015 0245   HDL 33 (L) 09/21/2015 0245   CHOLHDL 6.2 09/21/2015 0245   VLDL 59 (H) 09/21/2015 0245   LDLCALC 111 (H) 09/21/2015 0245      Imaging:  Dg Chest 2 View  Result Date: 09/20/2015 CLINICAL DATA:  Chest pain and shortness of breath. EXAM: CHEST  2 VIEW COMPARISON:  07/19/2015 FINDINGS: The heart size and mediastinal contours are within normal limits. Both lungs are clear. The visualized skeletal structures are unremarkable. IMPRESSION: No active cardiopulmonary disease. Electronically Signed   By: Misty Stanley M.D.   On:  09/20/2015 13:23      Assessment/Plan:   Active Problems:   DKA (diabetic ketoacidoses) ([place])   Hyperglycemia due to type 2 diabetes mellitus ([place])  1. NSTEMI: Peak troponin 2.10 now trending downward at 1.68. ECG without acute ST changes but QS V1-4. Currently on heparin. Echo ordered, not yet done.  2. HTN: improved today with resumption of amlodipine 5 mg and metoprolol 50 mg bid.  3. Uncontrolled DM: normal anion gap; treated with iv insulin, followed by medicine service.  4. Hyperlipidemia with an atherogenic panel with increased TG, low HDL increased VLDL; suspect significantly increased LDL particles despite LDL only 111; with DM needs aggressive intervention; recommend high potency statin with crestor 40 mg or atorvastatin 80 and add lovaza or vascepa 2 capsule bid. Consider fenofibrate Rx.  5. ESRD on dialysis 6. Anemia of CKD   7. Hypothyroidism on synthroid replacement.   Will further titrate metoprolol to 75 mg bid with HR 90 - 100.  Discussed definitive evaluation for CAD with cardiac catheterization with patient and mother. With significant risk factors, strong FH for premature CAD and NSTEM recommend to be done in 24 - 48 hrs.   Troy Sine, MD, [facility] 09/21/2015, 10:37 AM

## 2015-09-21 NOTE — Progress Notes (Signed)
Family Medicine Teaching Service Daily Progress Note Intern Pager: [phone]  Patient name: Russell [name] Medical record number: [id number] Date of birth: 1970/04/23 Age: 45 y.o. Gender: male  Primary Care Provider: [name], MD Consultants: Cardiology Code Status: Full  Pt Overview and Major Events to Date:  Admitted 9/18 for hyperglycemia and chest pain. 9/18: transitioned off insulin gtt, now on SQ insulin  Assessment and Plan: Russell [name] is a 45 y.o. male presenting with hyperglycemia, chest pain.Marland Kitchen. PMH is significant for ESRD on HD MWF, insulin dependent T2DM, HTN, anemia of chronic disease, peripheral neuropathy, hypothyroidism, depression, GERD.   # HHS, hyperglycemia: Labs consistent with HHS. Elevated pH, bicarb, slightly elevated osmolality. UA no ketones, no anion gap. Unsure of etiology. Possibly chest pain/MI (see below), no obvious infection, possible issue with current regimen vs compliance. Will monitor BMP to make sure gap doesn't open. If opens, will initiate DKA protocol. CBGs in 100s, so insulin drip discontinued. K+ 4, will not replete further. Given good response to Lantus 32 units, it makes me question home compliance more. - BMP daily - d/c IVF  - Mealtime SSI - continue Lantus 32 units at night - reglan prn nausea -CBGs q4h with meals and qHS  # Chest Pain: Troponin 1.71 > 2.1 > 1.83 > 1.68. EKG showed no acute changes but some abnormal repolarization.  Reproducible with abdominal palpation not typical. Last echo 2016 showed normal LVEF w/ moderate LV hypertrophy. Qtc prolonged at 498. If troponin rises, cards will consider cath. HEART score 5 (risk factors, EKG, troponin level). Attempted to try GI cocktail given atypical pain, however pt refused to take this. ASCVD score 9.9%.  - cardiology consult - on heparin drip - echo pending - discussed starting statin, pt notes they "make him ill." he notes he gets very angry with them. He notes taking  simvastatin and 2 others but cannot recall which ones.   # Hypertension: hx of essential hypertension. Was on amlodipine, clonidine, metoprolol, but now on them prn. Titrating up BP meds as tolerated. UDS neg. - per cardiology, amlodipine 5mg , metoprolol tartrate 50mg  BID. - if BPs remain elevated (150s/90s currently) consider titrating up anti-hypertensives  -consider IV nitro if significant HTN.   # T2DM: insulin dependent, uncontrolled. Last A1c 16.8 on 07/20/15. Home regimen humalog 5:1 per his report, however notes he's doing 15-30 units TID with meals and lantus 60 units BID. [facility] management as above.   # Headache: localized to occipital region, likely tension type - tylenol prn pain  # ESRD: MWF dialysis since 12/2014 after developing nephrotic syndrome, followed by Dr. Kathrene [place]. H/o horseshoe kidney. -continue home calcium acetate - consider c/s to renal as he did not complete HD Monday. No evidence for emergent HD.   # Hyperlipidemia: Total cholest 203. Not currently on statin. ASCVD risk 9%, high intensity statin recommended. Pt has history of irritability while on different statins in past. - will discuss with patient starting on crestor  # Hypothyroidism: stable, last TSH 1.2 on 07/20/15 -continue home synthroid  # Depression: stable -continue home celexa, clonopin - continue to monitor QTc in setting of celexa use.   # Anemia of chronic kidney disease: Hgb 9.3 on admission. Baseline Hgb 11.  -monitor CBC Unsure if pt receives EPO with HD.   # Peripheral neuropathy: secondary to T2DM -holding home gabapentin - not continuing home pain meds for now- has 1 time dose of PRN Dilaudid IV.  FEN/GI: carb  modified/renal diet Prophylaxis: heparin drip  Disposition: Home  Subjective:  Mr. Boody was lying comfortably in bed. Tolerating PO intake well. No vomiting overnight. Abdominal pain only to palpation, not at rest. Chest pain  and headache are of the same quality as yesterday.  Objective: Temp:  [97.5 F (36.4 C)-98.4 F (36.9 C)] 97.5 F (36.4 C) (09/19 0806) Pulse Rate:  [99-108] 106 (09/18 2037) Resp:  [11-21] 11 (09/19 0305) BP: (100-184)/(73-102) 152/92 (09/19 0806) SpO2:  [94 %-100 %] 100 % (09/19 0305) Weight:  [120 kg (264 lb 9 oz)] 120 kg (264 lb 9 oz) (09/18 1405) Physical Exam: General: Obese male lying in bed in NAD Cardiovascular: Tachycardia, regular rhythym, no murmur, chest pain reproducible with RLQ palpation Respiratory: CTAB, no increased work of breathing Abdomen: soft, bowel sounds present. Voluntary guarding, no involuntary guarding. Rebound tenderness per report but noted in the chest. RLQ tender to palpation. Pt gets up quickly from a supine position and seems to be in no acute distress.   Skin: Warm, dry, no peripheral edema. Small healing wound on medial R great toe. Psych: appropriate mood and affect  Laboratory:  Recent Labs Lab 09/20/15 1202 09/21/15 0245  WBC 6.3 7.0  HGB 9.3* 9.4*  HCT 27.6* 28.8*  PLT 262 290    Recent Labs Lab 09/20/15 1202  09/20/15 2201 09/21/15 0245 09/21/15 0623  NA 129*  < > 137 136 136  K 4.5  < > 3.9 4.0 3.8  CL 94*  < > 102 101 100*  CO2 24  < > 25 23 24   BUN 40*  < > 44* 47* 48*  CREATININE 4.89*  < > 5.39* 5.67* 5.89*  CALCIUM 8.2*  < > 8.5* 8.6* 8.4*  PROT 6.5  --   --   --   --   BILITOT 0.5  --   --   --   --   ALKPHOS 100  --   --   --   --   ALT 15*  --   --   --   --   AST 22  --   --   --   --   GLUCOSE 754*  < > 160* 197* 209*  < > = values in this interval not displayed.  Risk Stratification Labs  TSH    Component Value Date/Time   TSH 1.243 07/20/2015 0427   TSH 3.492 04/10/2014 1054   Hemoglobin A1C    Component Value Date/Time   HGBA1C 16.8 (H) 07/20/2015 0427   Lipid Panel     Component Value Date/Time   CHOL 203 (H) 09/21/2015 0245   TRIG 294 (H) 09/21/2015 0245   HDL 33 (L) 09/21/2015 0245    CHOLHDL 6.2 09/21/2015 0245   VLDL 59 (H) 09/21/2015 0245   LDLCALC 111 (H) 09/21/2015 0245   ABG: 7.4 / 39.5 / 76 / 24.7 / 95%  Osmolality: 304 Pt: 14.7 INR: 1.09 UDS: negative  UA: nitrites, ketones, leuks neg. Granular casts, mod Hgb, 0-5 RBCs, protein >300  Imaging/Diagnostic Tests:   Ernestina Columbia, Medical Student 09/21/2015, 8:39 AM PGY-, [facility] FPTS Intern pager: [phone], text pages welcome  RESIDENT ADDENDUM  I have separately seen and examined the patient. I have discussed the findings and exam with the medical student and agree with the above note, which I have edited appropriately. I helped develop the management plan that is described in the student's note, and I agree with the content.  Additionally  I have outlined my exam and assessment/plan below:   Chest pain stable without improvement. He refused GI cocktail. He still notes he has centrally located chest pain with palpation of the of the RLQ. No vomiting. Tolerating PO.   PE:  Blood pressure 131/67, pulse (!) 106, temperature 97.5 F (36.4 C), temperature source Oral, resp. rate 11, height 6\' 2"  (1.88 m), weight 264 lb 9 oz (120 kg), SpO2 100 %. General: Lying in bed in NAD. Non-toxic. Talkative.  Eyes: Conjunctivae non-injected.  ENTM: Moist mucous membranes. Oropharynx clear. No nasal discharge.  Neck: Supple, no LAD Cardiovascular: RRR. No murmurs, rubs, or gallops noted. Trace pitting edema noted. Respiratory: No increased WOB. CTAB without wheezing, rhonchi, or crackles noted. Abdomen: +BS, soft, non-distended, tender in the RLQ, however notes pain is in the central chest. No rebound or guarding.  A/P:  45 y/o male presenting with hyperglycemia >700 concerning for HHS. Continues to have chest pain. Troponins elevated at 2.01, then started to improve.    - off insulin gtt, looking better on Lantus 32 units - concerns for compliance given adequate glycemic control with Lantus 32  units - Moderate SSI  - cardiology following for chest pain.  - EKG stable - if patient is going to stay overnight, c/s renal today for HD - continue to monitor RLQ tenderness- consider PPI given he refused GI cocktail.   Joanna Puff, MD  PGY-3,  [facility] 09/21/2015  9:43 AM

## 2015-09-21 NOTE — Progress Notes (Signed)
ANTICOAGULATION CONSULT NOTE  Pharmacy Consult:  Heparin Indication: chest pain/ACS  Allergies  Allergen Reactions  . Ibuprofen Other (See Comments)    MD told him not to take due to kidney disease.  . Nsaids Other (See Comments)    Told to avoid all nsaids due to kidney disease-takes acetaminophen as needed    Patient Measurements: Height: 6\' 2"  (188 cm) Weight: 264 lb 9 oz (120 kg) IBW/kg (Calculated) : 82.2 Heparin Dosing Weight: 108 kg   Vital Signs: Temp: 97.5 F (36.4 C) (09/19 0806) Temp Source: Oral (09/19 0806) BP: 131/67 (09/19 0943)  Labs:  Recent Labs  09/20/15 1202  09/20/15 1542 09/20/15 1854 09/20/15 1855 09/20/15 2201 09/21/15 0027 09/21/15 0245 09/21/15 0623  HGB 9.3*  --   --   --   --   --   --  9.4*  --   HCT 27.6*  --   --   --   --   --   --  28.8*  --   PLT 262  --   --   --   --   --   --  290  --   APTT  --   --  28  --   --   --   --   --   --   LABPROT  --   --   --  14.7  --   --   --  14.2  --   INR  --   --   --  1.15  --   --   --  1.09  --   HEPARINUNFRC  --   --   --   --   --   --  0.29*  --   --   CREATININE 4.89*  --   --   --  5.26* 5.39*  --  5.67* 5.89*  TROPONINI  --   < > 1.71*  --  2.10* 1.83* 1.68*  --   --   < > = values in this interval not displayed.  Estimated Creatinine Clearance: 22 mL/min (by C-G formula based on SCr of 5.89 mg/dL (H)).   Assessment: 45 y.o. male with chest pain to continue on IV heparin.  Heparin level therapeutic at 0.5 units/mL per lab employee (system is down and result was obtained through a phone call).  No bleeding reported.   Goal of Therapy:  Heparin level 0.3-0.7 units/ml Monitor platelets by anticoagulation protocol: Yes    Plan:  - Continue heparin gtt at 1650 units/hr - Daily heparin level and CBC - Watch CBGs and may need to increase Lantus toward home dose    Darlette Dubow D. Laney Potashang, PharmD, BCPS Pager:  [phone] 09/21/2015, 2:13 PM

## 2015-09-21 NOTE — Progress Notes (Signed)
ANTICOAGULATION CONSULT NOTE  Pharmacy Consult for heparin Indication: chest pain/ACS  Allergies  Allergen Reactions  . Ibuprofen Other (See Comments)    MD told him not to take due to kidney disease.  . Nsaids Other (See Comments)    Told to avoid all nsaids due to kidney disease-takes acetaminophen as needed    Patient Measurements: Height: 6\' 2"  (188 cm) Weight: 264 lb 9 oz (120 kg) IBW/kg (Calculated) : 82.2 Heparin Dosing Weight: 108 kg   Vital Signs: Temp: 97.7 F (36.5 C) (09/19 0000) Temp Source: Oral (09/19 0000) BP: 112/73 (09/19 0000) Pulse Rate: 106 (09/18 2037)  Labs:  Recent Labs  09/20/15 1202  09/20/15 1542 09/20/15 1854 09/20/15 1855 09/20/15 2201 09/21/15 0027  HGB 9.3*  --   --   --   --   --   --   HCT 27.6*  --   --   --   --   --   --   PLT 262  --   --   --   --   --   --   APTT  --   --  28  --   --   --   --   LABPROT  --   --   --  14.7  --   --   --   INR  --   --   --  1.15  --   --   --   HEPARINUNFRC  --   --   --   --   --   --  0.29*  CREATININE 4.89*  --   --   --  5.26* 5.39*  --   TROPONINI  --   < > 1.71*  --  2.10* 1.83* 1.68*  < > = values in this interval not displayed.  Estimated Creatinine Clearance: 24.1 mL/min (by C-G formula based on SCr of 5.39 mg/dL (H)).  Assessment: 45 y.o. male with chest pain for heparin   Goal of Therapy:  Heparin level 0.3-0.7 units/ml Monitor platelets by anticoagulation protocol: Yes   Plan:  Increase Heparin  1650 units/hr Follow-up am labs.  Geannie RisenGreg Quaid Yeakle, PharmD, BCPS  09/21/15 1:51 AM

## 2015-09-21 NOTE — Progress Notes (Signed)
Inpatient Diabetes Program Recommendations  AACE/ADA: New Consensus Statement on Inpatient Glycemic Control (2015)  Target Ranges:  Prepandial:   less than 140 mg/dL      Peak postprandial:   less than 180 mg/dL (1-2 hours)      Critically ill patients:  140 - 180 mg/dL   Lab Results  Component Value Date   GLUCAP 281 (H) 09/21/2015   HGBA1C 16.8 (H) 07/20/2015   3:25p  Spoke with patient.  He is currently upset b/c he says that the MD's will not allow him to take Tylenol.  Looked at orders and discussed with MD.  He does have Tylenol ordered q 6 hours prn.  Patient also states that he usually takes Humalog 1 unit for every 5 grams of CHO.  Discussed with MD.  If meal coverage is added, will likely need reduction in Novolog correction to sensitive.  Also consider increasing Lantus to 25 units bid.  Called RN and discussed with her patient's request for Tylenol.  Thanks, Russell MeagerJenny Renelda Kilian, RN, BC-ADM Inpatient Diabetes Coordinator Pager [phone] (8a-5p)

## 2015-09-22 ENCOUNTER — Encounter (HOSPITAL_COMMUNITY)
Admission: EM | Disposition: A | Discharge status: Home Health | Payer: Self-pay | Source: Home / Self Care | Attending: Family Medicine

## 2015-09-22 ENCOUNTER — Other Ambulatory Visit: Payer: Self-pay | Admitting: *Deleted

## 2015-09-22 DIAGNOSIS — I2510 Atherosclerotic heart disease of native coronary artery without angina pectoris: Secondary | ICD-10-CM

## 2015-09-22 DIAGNOSIS — E1165 Type 2 diabetes mellitus with hyperglycemia: Secondary | ICD-10-CM

## 2015-09-22 DIAGNOSIS — I25110 Atherosclerotic heart disease of native coronary artery with unstable angina pectoris: Secondary | ICD-10-CM

## 2015-09-22 HISTORY — PX: CARDIAC CATHETERIZATION: SHX172

## 2015-09-22 LAB — GLUCOSE, CAPILLARY
GLUCOSE-CAPILLARY: 272 mg/dL — AB (ref 65–99)
GLUCOSE-CAPILLARY: 127 mg/dL — AB (ref 65–99)
GLUCOSE-CAPILLARY: 105 mg/dL — AB (ref 65–99)
Glucose-Capillary: 352 mg/dL — ABNORMAL HIGH (ref 65–99)
Glucose-Capillary: 291 mg/dL — ABNORMAL HIGH (ref 65–99)

## 2015-09-22 LAB — CBC
HEMATOCRIT: 28.2 % — AB (ref 39.0–52.0)
Hemoglobin: 9.1 g/dL — ABNORMAL LOW (ref 13.0–17.0)
MCH: 27.7 pg (ref 26.0–34.0)
MCHC: 32.3 g/dL (ref 30.0–36.0)
MCV: 86 fL (ref 78.0–100.0)
PLATELETS: 295 10*3/uL (ref 150–400)
RBC: 3.28 MIL/uL — ABNORMAL LOW (ref 4.22–5.81)
RDW: 16.1 % — AB (ref 11.5–15.5)
WBC: 6.4 10*3/uL (ref 4.0–10.5)

## 2015-09-22 LAB — BASIC METABOLIC PANEL
Anion gap: 11 (ref 5–15)
BUN: 59 mg/dL — AB (ref 6–20)
CHLORIDE: 100 mmol/L — AB (ref 101–111)
CO2: 21 mmol/L — ABNORMAL LOW (ref 22–32)
CREATININE: 6.75 mg/dL — AB (ref 0.61–1.24)
Calcium: 8.5 mg/dL — ABNORMAL LOW (ref 8.9–10.3)
GFR calc Af Amer: 10 mL/min — ABNORMAL LOW (ref >60)
GFR calc non Af Amer: 9 mL/min — ABNORMAL LOW (ref >60)
GLUCOSE: 336 mg/dL — AB (ref 65–99)
POTASSIUM: 3.9 mmol/L (ref 3.5–5.1)
SODIUM: 132 mmol/L — AB (ref 135–145)

## 2015-09-22 LAB — HEPARIN LEVEL (UNFRACTIONATED): HEPARIN UNFRACTIONATED: 0.38 [IU]/mL (ref 0.30–0.70)

## 2015-09-22 LAB — PROTIME-INR
INR: 0.93
Prothrombin Time: 12.4 seconds (ref 11.4–15.2)

## 2015-09-22 SURGERY — LEFT HEART CATH AND CORONARY ANGIOGRAPHY

## 2015-09-22 MED ORDER — DARBEPOETIN ALFA 40 MCG/0.4ML IJ SOSY
PREFILLED_SYRINGE | INTRAMUSCULAR | Status: AC
  Filled 2015-09-22: qty 0.4

## 2015-09-22 MED ORDER — FENTANYL CITRATE (PF) 100 MCG/2ML IJ SOLN
INTRAMUSCULAR | Status: AC
  Filled 2015-09-22: qty 2

## 2015-09-22 MED ORDER — VERAPAMIL HCL 2.5 MG/ML IV SOLN
INTRAVENOUS | Status: AC
  Filled 2015-09-22: qty 2

## 2015-09-22 MED ORDER — MUSCLE RUB 10-15 % EX CREA
TOPICAL_CREAM | CUTANEOUS | Status: DC | PRN
  Administered 2015-09-22: 19:00:00 via TOPICAL
  Filled 2015-09-22 (×2): qty 85

## 2015-09-22 MED ORDER — INSULIN ASPART 100 UNIT/ML ~~LOC~~ SOLN: 0.0000 [IU] | Freq: Three times a day (TID) | SUBCUTANEOUS | Status: DC

## 2015-09-22 MED ORDER — HEPARIN (PORCINE) IN NACL 2-0.9 UNIT/ML-% IJ SOLN
INTRAMUSCULAR | Status: AC
  Filled 2015-09-22: qty 1000

## 2015-09-22 MED ORDER — CALCIUM ACETATE (PHOS BINDER) 667 MG PO CAPS: 1334.0000 mg | ORAL_CAPSULE | Freq: Every evening | ORAL | Status: DC

## 2015-09-22 MED ORDER — ACETAMINOPHEN 325 MG PO TABS
ORAL_TABLET | ORAL | Status: AC
  Filled 2015-09-22: qty 2

## 2015-09-22 MED ORDER — HEPARIN (PORCINE) IN NACL 2-0.9 UNIT/ML-% IJ SOLN
INTRAMUSCULAR | Status: DC | PRN
  Administered 2015-09-22: 1000 mL via INTRA_ARTERIAL

## 2015-09-22 MED ORDER — TRAMADOL HCL 50 MG PO TABS
50.0000 mg | ORAL_TABLET | Freq: Two times a day (BID) | ORAL | Status: DC | PRN
  Administered 2015-09-22 – 2015-09-24 (×3): 50 mg via ORAL
  Filled 2015-09-22 (×3): qty 1

## 2015-09-22 MED ORDER — SODIUM CHLORIDE 0.9 % IV SOLN
100.0000 mL | INTRAVENOUS | Status: DC | PRN
  Administered 2015-09-28: 06:00:00 via INTRAVENOUS

## 2015-09-22 MED ORDER — MIDAZOLAM HCL 2 MG/2ML IJ SOLN
INTRAMUSCULAR | Status: DC | PRN
  Administered 2015-09-22: 1 mg via INTRAVENOUS

## 2015-09-22 MED ORDER — HEPARIN SODIUM (PORCINE) 1000 UNIT/ML IJ SOLN
INTRAMUSCULAR | Status: DC | PRN
  Administered 2015-09-22: 6000 [IU] via INTRAVENOUS

## 2015-09-22 MED ORDER — LIDOCAINE HCL (PF) 1 % IJ SOLN
INTRAMUSCULAR | Status: DC | PRN
  Administered 2015-09-22: 2 mL via INTRADERMAL

## 2015-09-22 MED ORDER — SODIUM CHLORIDE 0.9 % IV SOLN: 250.0000 mL | INTRAVENOUS | Status: DC | PRN

## 2015-09-22 MED ORDER — HEPARIN SODIUM (PORCINE) 1000 UNIT/ML DIALYSIS
1000.0000 [IU] | INTRAMUSCULAR | Status: DC | PRN
  Filled 2015-09-22: qty 1

## 2015-09-22 MED ORDER — SODIUM CHLORIDE 0.9 % IV SOLN: INTRAVENOUS | Status: DC

## 2015-09-22 MED ORDER — FENTANYL CITRATE (PF) 100 MCG/2ML IJ SOLN
INTRAMUSCULAR | Status: DC | PRN
  Administered 2015-09-22: 50 ug via INTRAVENOUS

## 2015-09-22 MED ORDER — CALCIUM ACETATE (PHOS BINDER) 667 MG PO CAPS
2001.0000 mg | ORAL_CAPSULE | Freq: Three times a day (TID) | ORAL | Status: DC
  Administered 2015-09-22 – 2015-10-05 (×25): 2001 mg via ORAL
  Filled 2015-09-22 (×27): qty 3

## 2015-09-22 MED ORDER — ASPIRIN EC 81 MG PO TBEC
81.0000 mg | DELAYED_RELEASE_TABLET | Freq: Once | ORAL | Status: DC
Start: 2015-09-22 — End: 2015-09-28

## 2015-09-22 MED ORDER — IOPAMIDOL (ISOVUE-370) INJECTION 76%
INTRAVENOUS | Status: DC | PRN
  Administered 2015-09-22: 65 mL via INTRA_ARTERIAL

## 2015-09-22 MED ORDER — VERAPAMIL HCL 2.5 MG/ML IV SOLN
INTRAVENOUS | Status: DC | PRN
  Administered 2015-09-22: 10 mL via INTRA_ARTERIAL

## 2015-09-22 MED ORDER — PROMETHAZINE HCL 25 MG/ML IJ SOLN
INTRAMUSCULAR | Status: AC
  Filled 2015-09-22: qty 1

## 2015-09-22 MED ORDER — ALTEPLASE 2 MG IJ SOLR: 2.0000 mg | Freq: Once | INTRAMUSCULAR | Status: DC | PRN

## 2015-09-22 MED ORDER — LIDOCAINE HCL (PF) 1 % IJ SOLN
INTRAMUSCULAR | Status: AC
  Filled 2015-09-22: qty 30

## 2015-09-22 MED ORDER — SODIUM CHLORIDE 0.9 % IV SOLN
62.5000 mg | Freq: Once | INTRAVENOUS | Status: AC
  Administered 2015-09-22: 62.5 mg via INTRAVENOUS
  Filled 2015-09-22: qty 5

## 2015-09-22 MED ORDER — MIDAZOLAM HCL 2 MG/2ML IJ SOLN
INTRAMUSCULAR | Status: AC
  Filled 2015-09-22: qty 2

## 2015-09-22 MED ORDER — DARBEPOETIN ALFA 40 MCG/0.4ML IJ SOSY
40.0000 ug | PREFILLED_SYRINGE | INTRAMUSCULAR | Status: DC
  Administered 2015-09-22: 40 ug via INTRAVENOUS

## 2015-09-22 MED ORDER — HEPARIN (PORCINE) IN NACL 100-0.45 UNIT/ML-% IJ SOLN: 1400.0000 [IU]/h | INTRAMUSCULAR | Status: DC

## 2015-09-22 MED ORDER — GABAPENTIN 100 MG PO CAPS
100.0000 mg | ORAL_CAPSULE | Freq: Three times a day (TID) | ORAL | Status: DC
  Administered 2015-09-22 – 2015-09-27 (×18): 100 mg via ORAL
  Filled 2015-09-22 (×18): qty 1

## 2015-09-22 MED ORDER — INSULIN GLARGINE 100 UNIT/ML ~~LOC~~ SOLN
36.0000 [IU] | Freq: Every day | SUBCUTANEOUS | Status: DC
  Administered 2015-09-22 – 2015-09-23 (×2): 36 [IU] via SUBCUTANEOUS
  Filled 2015-09-22 (×3): qty 0.36

## 2015-09-22 MED ORDER — INSULIN ASPART 100 UNIT/ML ~~LOC~~ SOLN
0.0000 [IU] | Freq: Every day | SUBCUTANEOUS | Status: DC
  Administered 2015-09-22: 3 [IU] via SUBCUTANEOUS

## 2015-09-22 MED ORDER — SODIUM CHLORIDE 0.9 % IV SOLN: 100.0000 mL | INTRAVENOUS | Status: DC | PRN

## 2015-09-22 MED ORDER — HEPARIN (PORCINE) IN NACL 100-0.45 UNIT/ML-% IJ SOLN
1800.0000 [IU]/h | INTRAMUSCULAR | Status: DC
  Administered 2015-09-23: 17:00:00 1650 [IU]/h via INTRAVENOUS
  Administered 2015-09-23: 01:00:00 1400 [IU]/h via INTRAVENOUS
  Administered 2015-09-24 – 2015-09-25 (×4): 1650 [IU]/h via INTRAVENOUS
  Administered 2015-09-26 – 2015-09-27 (×3): 1800 [IU]/h via INTRAVENOUS
  Filled 2015-09-22 (×9): qty 250

## 2015-09-22 MED ORDER — ACETAMINOPHEN 325 MG PO TABS
650.0000 mg | ORAL_TABLET | Freq: Once | ORAL | Status: AC
  Administered 2015-09-22: 650 mg via ORAL

## 2015-09-22 MED ORDER — INSULIN GLARGINE 100 UNIT/ML ~~LOC~~ SOLN
5.0000 [IU] | Freq: Once | SUBCUTANEOUS | Status: AC
  Administered 2015-09-22: 5 [IU] via SUBCUTANEOUS
  Filled 2015-09-22: qty 0.05

## 2015-09-22 MED ORDER — IOPAMIDOL (ISOVUE-370) INJECTION 76%
INTRAVENOUS | Status: AC
  Filled 2015-09-22: qty 100

## 2015-09-22 MED ORDER — NITROGLYCERIN IN D5W 200-5 MCG/ML-% IV SOLN
2.0000 ug/min | INTRAVENOUS | Status: DC
Start: 2015-09-22 — End: 2015-09-23
  Administered 2015-09-22: 17:00:00 5 ug/min via INTRAVENOUS
  Administered 2015-09-22: 10 ug via INTRAVENOUS

## 2015-09-22 MED ORDER — NITROGLYCERIN IN D5W 200-5 MCG/ML-% IV SOLN
INTRAVENOUS | Status: AC
  Administered 2015-09-22: 17:00:00 10 ug via INTRAVENOUS
  Filled 2015-09-22: qty 250

## 2015-09-22 MED ORDER — HEPARIN SODIUM (PORCINE) 1000 UNIT/ML IJ SOLN
INTRAMUSCULAR | Status: AC
  Filled 2015-09-22: qty 1

## 2015-09-22 MED ORDER — HEPARIN SODIUM (PORCINE) 1000 UNIT/ML DIALYSIS
8000.0000 [IU] | Freq: Once | INTRAMUSCULAR | Status: DC
  Filled 2015-09-22: qty 8

## 2015-09-22 MED ORDER — CALCITRIOL 0.25 MCG PO CAPS
ORAL_CAPSULE | ORAL | Status: AC
  Filled 2015-09-22: qty 1

## 2015-09-22 MED ORDER — PENTAFLUOROPROP-TETRAFLUOROETH EX AERO: 1.0000 "application " | INHALATION_SPRAY | CUTANEOUS | Status: DC | PRN

## 2015-09-22 MED ORDER — LIDOCAINE HCL (PF) 1 % IJ SOLN: 5.0000 mL | INTRAMUSCULAR | Status: DC | PRN

## 2015-09-22 MED ORDER — SODIUM CHLORIDE 0.9% FLUSH
3.0000 mL | Freq: Two times a day (BID) | INTRAVENOUS | Status: DC
  Administered 2015-09-22 – 2015-09-25 (×6): 3 mL via INTRAVENOUS

## 2015-09-22 MED ORDER — SODIUM CHLORIDE 0.9% FLUSH: 3.0000 mL | INTRAVENOUS | Status: DC | PRN

## 2015-09-22 MED ORDER — LIDOCAINE-PRILOCAINE 2.5-2.5 % EX CREA: 1.0000 "application " | TOPICAL_CREAM | CUTANEOUS | Status: DC | PRN

## 2015-09-22 MED ORDER — PROCHLORPERAZINE EDISYLATE 5 MG/ML IJ SOLN
10.0000 mg | Freq: Once | INTRAMUSCULAR | Status: AC
  Administered 2015-09-22: 10 mg via INTRAVENOUS

## 2015-09-22 SURGICAL SUPPLY — 10 items
CATH IMPULSE 5F ANG/FL3.5 (CATHETERS) ×2 IMPLANT
CATH OPTITORQUE JACKY 4.0 5F (CATHETERS) ×2 IMPLANT
DEVICE RAD COMP TR BAND LRG (VASCULAR PRODUCTS) ×2 IMPLANT
GLIDESHEATH SLEND SS 6F .021 (SHEATH) ×2 IMPLANT
KIT HEART LEFT (KITS) ×3 IMPLANT
PACK CARDIAC CATHETERIZATION (CUSTOM PROCEDURE TRAY) ×3 IMPLANT
SYR MEDRAD MARK V 150ML (SYRINGE) ×3 IMPLANT
TRANSDUCER W/STOPCOCK (MISCELLANEOUS) ×3 IMPLANT
TUBING CIL FLEX 10 FLL-RA (TUBING) ×3 IMPLANT
WIRE SAFE-T 1.5MM-J .035X260CM (WIRE) ×2 IMPLANT

## 2015-09-22 NOTE — Progress Notes (Addendum)
Inpatient Diabetes Program Recommendations  AACE/ADA: New Consensus Statement on Inpatient Glycemic Control (2015)  Target Ranges:  Prepandial:   less than 140 mg/dL      Peak postprandial:   less than 180 mg/dL (1-2 hours)      Critically ill patients:  140 - 180 mg/dL   Lab Results  Component Value Date   GLUCAP 127 (H) 09/22/2015   HGBA1C 16.8 (H) 07/20/2015    Review of Glycemic Control:  Results for [name], Russell [name] (MRN [id number]) as of 09/22/2015 12:44  Ref. Range 09/22/2015 00:13 09/22/2015 03:53 09/22/2015 12:32  Glucose-Capillary Latest Ref Range: 65 - 99 mg/dL 409352 (H) 811272 (H) 914127 (H)   Diabetes history: Type 2 diabetes with ESRD Outpatient Diabetes medications:  Lantus 60 units bid, Humalog 15-30 units (1 units for every 5 grams of CHO with evening meal) Current orders for Inpatient glycemic control: Lantus 32 units q HS, Novolog moderate tid with meals  Inpatient Diabetes Program Recommendations:    Please consider increasing Lantus to 25 units bid and add Novolog meal coverage 5 units tid with meals (hold if patient eats less than 50%). Note that patient to receive Lantus 32 units tonight.  If Lantus is increased, consider starting bid Lantus on 09/23/15.   If meal coverage is added, please reduce Novolog correction to sensitive.  Thanks, Beryl MeagerJenny Keatyn Luck, RN, BC-ADM Inpatient Diabetes Coordinator Pager [phone] (8a-5p)

## 2015-09-22 NOTE — Progress Notes (Signed)
Family Medicine Teaching Service Daily Progress Note Intern Pager: [phone]  Patient name: Russell [name] Medical record number: [id number] Date of birth: 06-27-1970 Age: 45 y.o. Gender: male  Primary Care Provider: [name], MD Consultants: Cardiology Code Status: Full  Pt Overview and Major Events to Date:  Admitted 9/18 for hyperglycemia and chest pain. 9/18: transitioned off insulin gtt, now on SQ insulin  Assessment and Plan: Russell [name] is a 45 y.o. male presenting with hyperglycemia, chest pain.Marland Kitchen. PMH is significant for ESRD on HD MWF, insulin dependent T2DM, HTN, anemia of chronic disease, peripheral neuropathy, hypothyroidism, depression, GERD.   # HHS, hyperglycemia: Labs consistent with HHS. Elevated pH, bicarb, slightly elevated osmolality. UA no ketones, no anion gap. Unsure of etiology. Possibly chest pain/MI (see below), no obvious infection, possible issue with current regimen vs compliance. Will monitor BMP to make sure gap doesn't open. If opens, will initiate DKA protocol. Insulin drip discontinued.  - BMP daily - Mealtime SSI - Gave 5 units Lantus this am. Will consider increasing nightly Lantus this pm. - reglan prn nausea -CBGs q4h with meals and qHS  # Chest Pain: Troponin 1.71 > 2.1 > 1.83 > 1.68. EKG showed no acute changes but some abnormal repolarization.  Reproducible with abdominal palpation not typical. Last echo 2016 showed normal LVEF w/ moderate LV hypertrophy. Qtc prolonged at 498. If troponin rises, cards will consider cath. HEART score 5 (risk factors, EKG, troponin level). Attempted to try GI cocktail given atypical pain, however pt refused to take this. Echo normal Ef, moderate LVH w/ severe hypertrophy posterior wall, no valve abnormality. ASCVD score 9.9%.  - cardiology consult - on heparin drip - Cath planned for today   # Hypertension: hx of essential hypertension. Was on amlodipine, clonidine, metoprolol, but now on them prn.  Titrating up BP meds as tolerated. UDS neg. - per cardiology, amlodipine 5mg , metoprolol tartrate 75 mg BID. - if BPs remain elevated (150s/90s at times) consider titrating up anti-hypertensives    # T2DM: insulin dependent, uncontrolled. Last A1c 16.8 on 07/20/15. Home regimen humalog 5:1 per his report, however notes he's doing 15-30 units TID with meals and lantus 60 units BID. [facility] management as above.   # Headache: localized to occipital region. Not sensitive to sound, no vision changes. Improves with occipital palpation. At his time, not very suspicious for intracranial process, symptoms most consistent with tension type headache/MSK. - tylenol prn pain - will do full neuro exam after HD  -will consider muscle relaxant  # ESRD: MWF dialysis since 12/2014 after developing nephrotic syndrome, followed [place] Dr. [place]. H/o horseshoe kidney. -continue home calcium acetate - nephrology for HD management  # Hyperlipidemia: Total cholest 203. Not currently on statin. ASCVD risk 9%, high intensity statin recommended. Pt has history of irritability while on different statins in past. Pt notes they "make him ill" and  he gets very angry with them. He notes taking simvastatin and 2 others but cannot recall which ones.  - per cardiology, started lipitor 80mg  daily  # Hypothyroidism: stable, last TSH 1.2 on 07/20/15 -continue home synthroid  # Depression: stable -continue home celexa, clonopin - continue to monitor QTc in setting of celexa use.   # Anemia of chronic kidney disease: Hgb 9.3 on admission. Baseline Hgb 11.  -monitor CBC Unsure if pt receives EPO with HD.   # Peripheral neuropathy: secondary to T2DM -restarting gabapentin to appropriate renal dose, 100 TID - not continuing  home pain meds for now- has 1 time dose of PRN Dilaudid IV.  FEN/GI: carb modified/renal diet Prophylaxis: heparin drip  Disposition: Home  Subjective:  Mr.  Leeth was getting HD this am. Reports continued headache, located in occipital region and radiates up head. Pain improves if he pushes on occipital region. Sometimes sensitive to light. Not exacerbated with sound or movement. Denies vision changes. Vomited twice since last night, clear/bilious. Pt attributes this to headache pain. Normal bowel movements.  Objective: Temp:  [97.5 F (36.4 C)-98.3 F (36.8 C)] 98.1 F (36.7 C) (09/20 0347) Pulse Rate:  [76-91] 76 (09/20 0347) Resp:  [13-19] 16 (09/20 0347) BP: (124-155)/(67-100) 155/90 (09/20 0347) SpO2:  [94 %-99 %] 94 % (09/20 0347) Physical Exam: General: Obese male lying in bed in NAD Cardiovascular: Tachycardia, regular rhythym, no murmur, chest pain reproducible with RLQ palpation Respiratory: CTAB, no increased work of breathing Abdomen: soft, bowel sounds present. Voluntary guarding, no involuntary guarding. Rebound tenderness per report but noted in the chest. RLQ tender to palpation, not tender to pressure from stethoscope. Pt gets up quickly from a supine position and seems to be in no acute distress.   Skin: Warm, dry, no peripheral edema. Small healing wound on medial R great toe.  Laboratory:  Recent Labs Lab 09/20/15 1202 09/21/15 0245 09/22/15 0226  WBC 6.3 7.0 6.4  HGB 9.3* 9.4* 9.1*  HCT 27.6* 28.8* 28.2*  PLT 262 290 295    Recent Labs Lab 09/20/15 1202  09/21/15 0245 09/21/15 0623 09/22/15 0226  NA 129*  < > 136 136 132*  K 4.5  < > 4.0 3.8 3.9  CL 94*  < > 101 100* 100*  CO2 24  < > 23 24 21*  BUN 40*  < > 47* 48* 59*  CREATININE 4.89*  < > 5.67* 5.89* 6.75*  CALCIUM 8.2*  < > 8.6* 8.4* 8.5*  PROT 6.5  --   --   --   --   BILITOT 0.5  --   --   --   --   ALKPHOS 100  --   --   --   --   ALT 15*  --   --   --   --   AST 22  --   --   --   --   GLUCOSE 754*  < > 197* 209* 336*  < > = values in this interval not displayed.  Risk Stratification Labs  TSH    Component Value Date/Time   TSH 1.243  07/20/2015 0427   TSH 3.492 04/10/2014 1054   Hemoglobin A1C    Component Value Date/Time   HGBA1C 16.8 (H) 07/20/2015 0427   Lipid Panel     Component Value Date/Time   CHOL 203 (H) 09/21/2015 0245   TRIG 294 (H) 09/21/2015 0245   HDL 33 (L) 09/21/2015 0245   CHOLHDL 6.2 09/21/2015 0245   VLDL 59 (H) 09/21/2015 0245   LDLCALC 111 (H) 09/21/2015 0245   ABG: 7.4 / 39.5 / 76 / 24.7 / 95%  Osmolality: 304 Pt: 14.7 INR: 1.09 UDS: negative  UA: nitrites, ketones, leuks neg. Granular casts, mod Hgb, 0-5 RBCs, protein >300  Imaging/Diagnostic Tests:  ECHO 09/21/2015 ------------------------------------------------------------------- Study Conclusions  - Left ventricle: The cavity size was normal. Septal wall thickness was increased in a pattern of moderate LVH with severe hypertrophy of the posterior wall. Systolic function was normal. The estimated ejection fraction was in the range of 60% to  65%.   Wall motion was normal; there were no regional wall motion abnormalities. Doppler parameters are consistent with abnormal left ventricular relaxation (grade 1 diastolic dysfunction). Doppler parameters are consistent with indeterminate ventricular filling pressure. - Aortic valve: Transvalvular velocity was within the normal range.There was no stenosis. There was no regurgitation. - Mitral valve: Transvalvular velocity was within the normal range.There was no evidence for stenosis. There was trivial regurgitation. - Right ventricle: The cavity size was normal. Wall thickness was normal. Systolic function was normal. - Tricuspid valve: There was no regurgitation. - Pulmonic valve: There was no regurgitation.  Ernestina Columbia, Medical Student 09/22/2015, 7:23 AM PGY-, [name] FPTS Intern pager: [phone], text pages welcome  RESIDENT ADDENDUM  I have separately seen and examined the patient. I have discussed the findings and exam with the medical student and agree  with the above note, which I have edited appropriately. I helped develop the management plan that is described in the student's note, and I agree with the content.  Additionally I have outlined my exam and assessment/plan below:   Chest pain and headache still without improvement. 2 episodes of emesis.   PE:  Blood pressure 105/64, pulse 76, temperature 97.8 F (36.6 C), temperature source Oral, resp. rate 16, height 6\' 2"  (1.88 m), weight 267 lb 3.2 oz (121.2 kg), SpO2 100 %. General: Lying in bed on HD, non-toxic.  Eyes: Conjunctivae non-injected.  ENTM: Moist mucous membranes. Oropharynx clear. No nasal discharge.  Neck: Supple, no LAD Cardiovascular: RRR. No murmurs, rubs, or gallops noted. Trace pitting edema noted. Respiratory: No increased WOB. CTAB without wheezing, rhonchi, or crackles noted. Abdomen: +BS, soft, non-distended, tender to deep palpation with distraction in the RLQ, however notes pain is in the central chest. No rebound or guarding.  A/P:  45 y/o male presenting with hyperglycemia >700 concerning for HHS. CBGs with elevation to 200-300s in the last 12hrs.   - received Lantus 32 units last night, will give 5 units Lantus no.  - concerns for compliance given low levels of Lantus now.  - Moderate SSI  - cardiology following for chest pain.  - cath today.  - continue to monitor RLQ tenderness- consider PPI given he refused GI cocktail.  - renal following, HD today.    Joanna Puff, MD [facility] Resident  09/22/2015, 9:55 AM

## 2015-09-22 NOTE — Progress Notes (Signed)
TR BAND REMOVAL  LOCATION:    right radial  DEFLATED PER PROTOCOL:    Yes.    TIME BAND OFF / DRESSING APPLIED:    20:15   SITE UPON ARRIVAL:    Level 0  SITE AFTER BAND REMOVAL:    Level 0  CIRCULATION SENSATION AND MOVEMENT:    Within Normal Limits   Yes.    COMMENTS:   Post TR band instructions given. No bleeding; no hematoma. Pt tolerated well.

## 2015-09-22 NOTE — Procedures (Signed)
  I was present at this dialysis session, have reviewed the session itself and made  appropriate changes Vinson Moselleob Apple Dearmas MD [facility] pager [phone]    cell [phone] 09/22/2015, 1:04 PM

## 2015-09-22 NOTE — Consult Note (Signed)
[address]       [place] [address]             [phone]        [name] [facility] Medical Record #[id number] Date of Birth: 1970/01/06  Referring: No ref. provider found Primary Care: [name], MD  Chief Complaint:    Chief Complaint  Patient presents with  . Emesis  . Headache  . Abdominal Pain    History of Present Illness:     Russell [name] is a 45 y.o. male who prsentedwith chest pain, hyperglycemia two days ago.   Friday at dialysis he had 7L removed which was unusual. Over the weekend he noted  increasing  Head aches and chest pain intermittently that has progressively worsened. Because same symptoms he could not complete dialysis Monday and was admitted.  2 hours into his HD today, he noted he couldn't tolerate it any more.   He takes Dilaudid at home for "neuropathy" and any pain Tylenol doesn't help with.  He notes he's been off all his anti-hypertensives for months as his BP was too low at HD.    He is on Humalog 5:1. He takes Lantus 60 units BID. He notes he's been taking insulin regularly.   In the ED his CBG was 754 which concerned them for DKA. Cardiology was consulted for chest pain.   Hypercoagulability workup [facility] 2016- Hypercoagulability, etiology unknown Patient has a history of superficial thrombophlebitis with progression of clot burden despite anti-coagulation with Xarelto. Approximately 3 weeks ago the patient discontinued his anti-coagulation (he states at the direction of a physician, although this is unclear from outside records). Hem/Onc was consulted to advise on hypercoagulability and anti-coagulation. They performed a hypercoagulability work up. Results: free protein S ag 94, protein C activity 123, plasminogen 94, antithrombin III 74, factor V absent, factor II 20210 mutation absent, cardiolipin igg ab <14, igm <12, iga <11, beta-2-glyco igg/igm/iga <9, lupus inhibitor not detected     Current  Activity/ Functional Status: Patient is independent with mobility/ambulation, transfers, ADL's, IADL's.   Zubrod Score: At the time of surgery this patient's most appropriate activity status/level should be described as: []     0    Normal activity, no symptoms [x]     1    Restricted in physical strenuous activity but ambulatory, able to do out light work []     2    Ambulatory and capable of self care, unable to do work activities, up and about                 more than 50%  Of the time                            []     3    Only limited self care, in bed greater than 50% of waking hours []     4    Completely disabled, no self care, confined to bed or chair []     5    Moribund  Past Medical History:  Diagnosis Date  . Age-related macular degeneration, wet, both eyes (HCC)    "I'm getting Alia treatments" (12/30/2014)  . Anxiety   . Arthritis    "hands" (12/30/2014)  . Asthma   . 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 Daily headache   . Depression   . DVT (deep venous thrombosis),  H/o 01/2014-on Xarelto 03/24/2014   LLE  . Hypertension   . Hypothyroidism   . Nephrotic syndrome 05/18/2014  . Pneumonia 11/2013  . Renal insufficiency   . Secondary DM with DKA-AG=16, BIcarb Nl 03/24/2014  . Type 2 diabetes mellitus with diabetic nephropathy [facility](HCC)     Past Surgical History:  Procedure Laterality Date  . ANKLE FRACTURE SURGERY Right 1988  . AV FISTULA PLACEMENT Left 01/01/2015   Procedure: CREATION OF LEFT RADIAL CEPHALIC ARTERIOVENOUS (AV) FISTULA ;  Surgeon: Pryor OchoaJames D Lawson, MD;  [facility];  Service: Vascular;  Laterality: Left;  . ESOPHAGOGASTRODUODENOSCOPY N/A 03/29/2014   Procedure: ESOPHAGOGASTRODUODENOSCOPY (EGD);  Surgeon: Dorena CookeyJohn Hayes, MD;  [facility];  Service: Endoscopy;  Laterality: N/A;  . EYE SURGERY    . FRACTURE SURGERY    . INSERTION OF DIALYSIS CATHETER Right 01/05/2015   Procedure: INSERTION OF RIGHT INTERNAL JUGULAR DIALYSIS CATHETER;   Surgeon: Fransisco HertzBrian L Chen, MD;  [facility];  Service: Vascular;  Laterality: Right;  . TONSILLECTOMY AND ADENOIDECTOMY  1970s    History  Smoking Status  . Never Smoker  Smokeless Tobacco  . Never Used    History  Alcohol Use No    Social History   Social History  . Marital status: Single    Spouse name: N/A  . Number of children: N/A  . Years of education: N/A   Occupational History  .      Maintenance [employer]   Social History Main Topics  . Smoking status: Never Smoker  . Smokeless tobacco: Never Used  . Alcohol use No  . Drug use: No  . Sexual activity: Not Currently   Other Topics Concern  . Not on file   Social History Narrative   Patient currently lives with his fiance   Works with facilities   Nonsmoker, nondrinker       Allergies  Allergen Reactions  . Ibuprofen Other (See Comments)    MD told him not to take due to kidney disease.  . Nsaids Other (See Comments)    Told to avoid all nsaids due to kidney disease-takes acetaminophen as needed    Current Facility-Administered Medications  Medication Dose Route Frequency Provider Last Rate Last Dose  . 0.9 %  sodium chloride infusion  100 mL Intravenous PRN Pola Cornita Harbison Brown, NP      . 0.9 %  sodium chloride infusion  100 mL Intravenous PRN Pola Cornita Harbison Brown, NP      . 0.9 %  sodium chloride infusion   Intravenous Continuous Tonny BollmanMichael Cooper, MD 10 mL/hr at 09/22/15 1700    . 0.9 %  sodium chloride infusion  250 mL Intravenous PRN Iran OuchMuhammad A Arida, MD      . acetaminophen (TYLENOL) tablet 500 mg  500 mg Oral Q6H PRN Joanna Puffrystal S Dorsey, MD   500 mg at 09/22/15 1247  . alteplase (CATHFLO ACTIVASE) injection 2 mg  2 mg Intracatheter Once PRN Pola Cornita Harbison Brown, NP      . amLODipine (NORVASC) tablet 5 mg  5 mg Oral Daily Leone BrandLaura R Ingold, NP   5 mg at 09/22/15 1247  . aspirin EC tablet 81 mg  81 mg Oral Once Garth BignessKathryn Timberlake, MD      . atorvastatin (LIPITOR) tablet 80 mg  80 mg Oral q1800 Lennette Biharihomas A  Kelly, MD   80 mg at 09/22/15 1710  . calcitRIOL (ROCALTROL) 0.25 MCG capsule           . calcitRIOL (ROCALTROL)  capsule 0.25 mcg  0.25 mcg Oral Q M,W,F-HD Pola Corn, NP   0.25 mcg at 09/22/15 1248  . calcium acetate (PHOSLO) capsule 2,001 mg  2,001 mg Oral TID WC Pola Corn, NP   2,001 mg at 09/22/15 1712  . citalopram (CELEXA) tablet 60 mg  60 mg Oral QHS Joanna Puff, MD   60 mg at 09/21/15 2123  . clonazePAM (KLONOPIN) tablet 2 mg  2 mg Oral QHS Joanna Puff, MD   2 mg at 09/21/15 2123  . Darbepoetin Alfa (ARANESP) injection 40 mcg  40 mcg Intravenous Q Wed-HD Pola Corn, NP   40 mcg at 09/22/15 1050  . gabapentin (NEURONTIN) capsule 100 mg  100 mg Oral TID Joanna Puff, MD   100 mg at 09/22/15 1711  . gi cocktail (Maalox,Lidocaine,Donnatal)  30 mL Oral QID PRN Joanna Puff, MD      . gi cocktail (Maalox,Lidocaine,Donnatal)  30 mL Oral Once Joanna Puff, MD      . heparin ADULT infusion 100 units/mL (25000 units/253mL sodium chloride 0.45%)  1,400 Units/hr Intravenous Continuous Nestor Ramp, MD      . heparin injection 1,000 Units  1,000 Units Dialysis PRN Pola Corn, NP      . heparin injection 8,000 Units  8,000 Units Dialysis Once in dialysis Pola Corn, NP      . insulin aspart (novoLOG) injection 0-15 Units  0-15 Units Subcutaneous TID WC Campbell Stall, MD   8 Units at 09/21/15 1700  . insulin glargine (LANTUS) injection 32 Units  32 Units Subcutaneous QHS Campbell Stall, MD   32 Units at 09/21/15 2123  . levothyroxine (SYNTHROID, LEVOTHROID) tablet 50 mcg  50 mcg Oral QAC breakfast Joanna Puff, MD   50 mcg at 09/22/15 1712  . lidocaine (PF) (XYLOCAINE) 1 % injection 5 mL  5 mL Intradermal PRN Pola Corn, NP      . lidocaine-prilocaine (EMLA) cream 1 application  1 application Topical PRN Pola Corn, NP      . metoCLOPramide (REGLAN) tablet 5 mg  5 mg Oral Q8H PRN Joanna Puff, MD   5 mg at  09/22/15 0457  . metoprolol tartrate (LOPRESSOR) tablet 75 mg  75 mg Oral BID Lennette Bihari, MD   75 mg at 09/22/15 1247  . MUSCLE RUB CREA   Topical PRN Wendee Beavers, DO      . nitroGLYCERIN 50 mg in dextrose 5 % 250 mL (0.2 mg/mL) infusion  2-200 mcg/min Intravenous Titrated Iran Ouch, MD 1.5 mL/hr at 09/22/15 1700 5 mcg/min at 09/22/15 1700  . omega-3 acid ethyl esters (LOVAZA) capsule 2 g  2 g Oral BID Lennette Bihari, MD   2 g at 09/22/15 1248  . pentafluoroprop-tetrafluoroeth (GEBAUERS) aerosol 1 application  1 application Topical PRN Pola Corn, NP      . promethazine (PHENERGAN) 25 MG/ML injection           . sodium chloride flush (NS) 0.9 % injection 3 mL  3 mL Intravenous Q12H Iran Ouch, MD   3 mL at 09/22/15 1716  . sodium chloride flush (NS) 0.9 % injection 3 mL  3 mL Intravenous PRN Iran Ouch, MD        Prescriptions Prior to Admission  Medication Sig Dispense Refill Last Dose  . acetaminophen (TYLENOL) 500 MG tablet Take 1 tablet (500 mg total) by mouth every  6 (six) hours as needed for moderate pain. (Patient taking differently: Take 1,000 mg by mouth every 6 (six) hours as needed for moderate pain. ) 30 tablet 0 09/20/2015 at Unknown time  . albuterol (PROVENTIL HFA;VENTOLIN HFA) 108 (90 Base) MCG/ACT inhaler Inhale 1 puff into the lungs every 6 (six) hours as needed for wheezing or shortness of breath. 1 Inhaler 11 09/19/2015 at Unknown time  . albuterol-ipratropium (COMBIVENT) 18-103 MCG/ACT inhaler Inhale 2 puffs into the lungs every 4 (four) hours. (Patient taking differently: Inhale 2 puffs into the lungs every 6 (six) hours as needed for wheezing or shortness of breath. ) 1 Inhaler 11 09/19/2015 at Unknown time  . amLODipine (NORVASC) 10 MG tablet Take 1 tablet (10 mg total) by mouth daily. 30 tablet 2 09/20/2015 at Unknown time  . calcium acetate (PHOSLO) 667 MG capsule Take 2 capsules (1,334 mg total) by mouth 3 (three) times daily with meals.  180 capsule 0 09/20/2015 at Unknown time  . citalopram (CELEXA) 40 MG tablet Take 1.5 tablets (60 mg total) by mouth at bedtime. 90 tablet 3 09/19/2015 at Unknown time  . clonazePAM (KLONOPIN) 2 MG tablet Take 2 mg by mouth at bedtime.   09/19/2015 at Unknown time  . cloNIDine (CATAPRES) 0.1 MG tablet Take 1 tablet (0.1 mg total) by mouth 2 (two) times daily. (Patient taking differently: Take 0.2 mg by mouth 2 (two) times daily. ) 60 tablet 0 09/19/2015 at Unknown time  . cyclobenzaprine (FLEXERIL) 10 MG tablet Take 1 tablet (10 mg total) by mouth at bedtime. 30 tablet 11 09/19/2015 at Unknown time  . gabapentin (NEURONTIN) 100 MG capsule Take 2 capsules (200 mg total) by mouth 3 (three) times daily. (Patient taking differently: Take 100-200 mg by mouth 4 (four) times daily as needed. ) 180 capsule 11 09/20/2015 at Unknown time  . HYDROcodone-acetaminophen (NORCO/VICODIN) 5-325 MG tablet Take 1 tablet by mouth every 4 (four) hours as needed for pain.   past week  . HYDROmorphone (DILAUDID) 2 MG tablet Take 1 tablet (2 mg total) by mouth every 8 (eight) hours as needed for severe pain. (Patient taking differently: Take 2-4 mg by mouth every 8 (eight) hours as needed for severe pain. ) 90 tablet 0 Past Week at Unknown time  . hydrOXYzine (ATARAX/VISTARIL) 25 MG tablet Take 25 mg by mouth 2 (two) times daily as needed for anxiety.    09/19/2015 at Unknown time  . insulin glargine (LANTUS) 100 UNIT/ML injection Inject 60 Units into the skin 2 (two) times daily.   09/19/2015 at Unknown time  . insulin lispro (HUMALOG) 100 UNIT/ML injection Inject 15-30 Units into the skin every evening. Take one unit per 5 carbs depending on dinner.   09/19/2015 at Unknown time  . levothyroxine (SYNTHROID, LEVOTHROID) 50 MCG tablet Take 1 tablet (50 mcg total) by mouth daily. 90 tablet 3 09/20/2015 at Unknown time  . metoCLOPramide (REGLAN) 5 MG tablet Take 1 tablet (5 mg total) by mouth every 8 (eight) hours as needed for nausea. 30  tablet 11 09/20/2015 at Unknown time  . metoprolol succinate (TOPROL-XL) 100 MG 24 hr tablet Take 1 tablet (100 mg total) by mouth daily. (Patient taking differently: Take 100 mg by mouth at bedtime as needed. BP greater than 170) 90 tablet 3 09/19/2015 at 2200  . promethazine (PHENERGAN) 25 MG tablet Take 1 tablet (25 mg total) by mouth every 8 (eight) hours as needed for nausea. 90 tablet 11 09/20/2015 at Unknown time  .  omeprazole (PRILOSEC) 40 MG capsule Take 1 capsule (40 mg total) by mouth 2 (two) times daily. (Patient not taking: Reported on 09/20/2015) 60 capsule 0 Not Taking at Unknown time  . polyethylene glycol (MIRALAX) packet Take 17 g by mouth daily. (Patient not taking: Reported on 09/20/2015) 14 each 0 Not Taking at Unknown time  . terbinafine (LAMISIL) 250 MG tablet Take 1 tablet (250 mg total) by mouth daily. (Patient not taking: Reported on 09/20/2015) 30 tablet 2 Not Taking at Unknown time    Family History  Problem Relation Age of Onset  . Obesity Mother     Patient states that family members have no other medical illnesses other than what I have described  . Kidney cancer Maternal Grandmother   . Cancer Father 50    AML  . Heart disease      No family history     Review of Systems:      Cardiac Review of Systems: Y or N  Chest Pain [ y   ]  Resting SOB [  y ] Exertional SOB  [ y ]  Orthopnea [ y ]   Pedal Edema [n   ]    Palpitations [n  ] Syncope  [ n ]   Presyncope [ y ]  General Review of Systems: [Y] = yes [  ]=no Constitional: recent weight change [ y ]; anorexia [  ]; fatigue [place]  ]; nausea [ y ]; night sweats [  ]; fever [  ]; or chills [  ]                                                               Dental: poor dentition[  ]; Last Dentist visit:   Eye : blurred vision [  y]; diplopia [   ]; vision changes [  ];  Amaurosis fugax[  ]; Resp: cough [  ];  wheezing[  ];  hemoptysis[  ]; shortness of breath[y ]; paroxysmal nocturnal dyspnea[  y]; dyspnea on exertion[   ]; or orthopnea[  ];  GI:  gallstones[  ], vomiting[ y ];  dysphagia[  ]; melena[  ];  hematochezia [  ]; heartburn[  ];   Hx of  Colonoscopy[  ]; GU: kidney stones [  ]; hematuria[  ];   dysuria [  ];  nocturia[  ];  history of     obstruction [  ]; urinary frequency [  ]             Skin: rash, swelling[  ];, hair loss[  ];  peripheral edema[  ];  or itching[  ]; Musculosketetal: myalgias[  ];  joint swelling[  ];  joint erythema[  ];  joint pain[  ];  back pain[  ];  Heme/Lymph: bruising[  ];  bleeding[  ];  anemia[  ];  Neuro: TIA[  ];  headaches[  ];  stroke[  ];  vertigo[  ];  seizures[  ];   paresthesias[  ];  difficulty walking[ y ];  Psych:depression[  ]; anxiety[  ];  Endocrine: diabetes[  ];  thyroid dysfunction[ y ];  Immunizations: Flu Milo.Brash  ]; Pneumococcal[ n ];  Other:  Physical Exam: BP (!) 207/82 (BP Location: Left Leg)   Pulse 73  Temp 98.1 F (36.7 C) (Oral)   Resp 13   Ht 6\' 2"  (1.88 m)   Wt 260 lb 2.3 oz (118 kg) Comment: standing wt.  SpO2 98%   BMI 33.40 kg/m    General appearance: alert, cooperative, appears older than stated age, fatigued and no distress Head: Normocephalic, without obvious abnormality, atraumatic Neck: no adenopathy, no carotid bruit, no JVD, supple, symmetrical, trachea midline and thyroid not enlarged, symmetric, no tenderness/mass/nodules Lymph nodes: Cervical, supraclavicular, and axillary nodes normal. Resp: diminished breath sounds bibasilar Back: negative, symmetric, no curvature. ROM normal. No CVA tenderness. Cardio: regular rate and rhythm, S1, S2 normal, no murmur, click, rub or gallop GI: soft, non-tender; bowel sounds normal; no masses,  no organomegaly Extremities: extremities normal, atraumatic, no cyanosis or edema and Homans sign is negative, no sign of DVT Neurologic: Grossly normal Palpable dp and pt pulses bilateral,   Diagnostic Studies & Laboratory data:     Recent Radiology Findings:  Dg Chest 2 View  Result  Date: 09/20/2015 CLINICAL DATA:  Chest pain and shortness of breath. EXAM: CHEST  2 VIEW COMPARISON:  07/19/2015 FINDINGS: The heart size and mediastinal contours are within normal limits. Both lungs are clear. The visualized skeletal structures are unremarkable. IMPRESSION: No active cardiopulmonary disease. Electronically Signed   By: Kennith Center M.D.   On: 09/20/2015 13:23    I have independently reviewed the above radiologic studies.  Recent Lab Findings: Lab Results  Component Value Date   WBC 6.4 09/22/2015   HGB 9.1 (L) 09/22/2015   HCT 28.2 (L) 09/22/2015   PLT 295 09/22/2015   GLUCOSE 336 (H) 09/22/2015   CHOL 203 (H) 09/21/2015   TRIG 294 (H) 09/21/2015   HDL 33 (L) 09/21/2015   LDLCALC 111 (H) 09/21/2015   ALT 15 (L) 09/20/2015   AST 22 09/20/2015   NA 132 (L) 09/22/2015   K 3.9 09/22/2015   CL 100 (L) 09/22/2015   CREATININE 6.75 (H) 09/22/2015   BUN 59 (H) 09/22/2015   CO2 21 (L) 09/22/2015   TSH 1.243 07/20/2015   INR 0.93 09/22/2015   HGBA1C 16.8 (H) 07/20/2015   CATH:by Arida Conclusion     Ramus lesion, 90 %stenosed.  Ost 1st Diag to 1st Diag lesion, 95 %stenosed.  Prox LAD lesion, 80 %stenosed.  The left ventricular systolic function is normal.  LV end diastolic pressure is mildly elevated.  The left ventricular ejection fraction is 55-65% by visual estimate.   1. Severe two-vessel coronary arte involving a bifurcation proximal LAD/diagonal 1 and proximal large ramus. 2. Normal LV systolic function with mildly elevated LVEDP.   Recommendations: Given that the patient is diabetic with proximal bifurcation LAD involvement, I think the best option is CABG. I consulted CT surgery.  Resume heparin 8 hours after sheath pulled. I'm going to start nitroglycerin drip for blood pressure control and for mild chest pain.   ECHO: LV EF: 60% -   65%  ------------------------------------------------------------------- Indications:      Chest pain  786.51.  ------------------------------------------------------------------- History:   PMH:  Obesity. Renal insufficiency. Hypothyroidism. Asthma. Chronic kidney disease.  Risk factors:  Hypertension. Diabetes mellitus.  ------------------------------------------------------------------- Study Conclusions  - Left ventricle: The cavity size was normal. Septal wall thickness   was increased in a pattern of moderate LVH with severe   hypertrophy of the posterior wall. Systolic function was normal.   The estimated ejection fraction was in the range of 60% to 65%.   Wall motion was  normal; there were no regional wall motion   abnormalities. Doppler parameters are consistent with abnormal   left ventricular relaxation (grade 1 diastolic dysfunction).   Doppler parameters are consistent with indeterminate ventricular   filling pressure. - Aortic valve: Transvalvular velocity was within the normal range.   There was no stenosis. There was no regurgitation. - Mitral valve: Transvalvular velocity was within the normal range.   There was no evidence for stenosis. There was trivial   regurgitation. - Right ventricle: The cavity size was normal. Wall thickness was   normal. Systolic function was normal. - Tricuspid valve: There was no regurgitation. - Pulmonic valve: There was no regurgitation.   Assessment / Plan:    CAD with unstable angina and chf acute systolic and diastolic with fluid overload.    Consider CABG Tuesday, if BP , headaches and glucose is actively controlled, may need insulin drip and regulation of BP meds.    I  spent 40 minutes counseling the patient face to face and 50% or more the  time was spent in counseling and coordination of care. The total time spent in the appointment was 60 minutes.    Delight Ovens MD      [address] [place] [place] [address] Office [phone]   Beeper [phone]  09/22/2015 6:06 PM

## 2015-09-22 NOTE — H&P (View-Only) (Signed)
Subjective: Still with mild chest pressure that has never resolved.    Objective: Vital signs in last 24 hours: Temp:  [97.7 F (36.5 C)-98.3 F (36.8 C)] 98.1 F (36.7 C) (09/20 1233) Pulse Rate:  [72-91] 72 (09/20 1400) Resp:  [12-18] 12 (09/20 1400) BP: (93-155)/(64-100) 129/92 (09/20 1400) SpO2:  [94 %-100 %] 96 % (09/20 1400) Weight:  [260 lb 2.3 oz (118 kg)-267 lb 3.2 oz (121.2 kg)] 260 lb 2.3 oz (118 kg) (09/20 1142) Weight change:  Last BM Date: 09/21/15 Intake/Output from previous day: 09/19 0701 - 09/20 0700 In: 398.8 [I.V.:398.8] Out: 650 [Urine:650] Intake/Output this shift: No intake/output data recorded.  PE: General:Pleasant affect, NAD Skin:Warm and dry, brisk capillary refill HEENT:normocephalic, sclera clear, mucus membranes moist Neck:supple, no JVD, no bruits  Heart:S1S2 RRR without murmur, gallup, rub or click Lungs:clear without rales, rhonchi, or wheezes ZOX:WRUEAbd:soft, non tender, + BS, do not palpate liver spleen or masses Ext:no lower ext edema, 2+ pedal pulses, 2+ radial pulses Neuro:alert and oriented X 3, MAE, follows commands, + facial symmetry Tele:  SR Lab Results:  Recent Labs  09/21/15 0245 09/22/15 0226  WBC 7.0 6.4  HGB 9.4* 9.1*  HCT 28.8* 28.2*  PLT 290 295   BMET  Recent Labs  09/21/15 0623 09/22/15 0226  NA 136 132*  K 3.8 3.9  CL 100* 100*  CO2 24 21*  GLUCOSE 209* 336*  BUN 48* 59*  CREATININE 5.89* 6.75*  CALCIUM 8.4* 8.5*    Recent Labs  09/20/15 2201 09/21/15 0027  TROPONINI 1.83* 1.68*    Lab Results  Component Value Date   CHOL 203 (H) 09/21/2015   HDL 33 (L) 09/21/2015   LDLCALC 111 (H) 09/21/2015   TRIG 294 (H) 09/21/2015   CHOLHDL 6.2 09/21/2015   Lab Results  Component Value Date   HGBA1C 16.8 (H) 07/20/2015     Lab Results  Component Value Date   TSH 1.243 07/20/2015    Hepatic Function Panel  Recent Labs  09/20/15 1202  PROT 6.5  ALBUMIN 3.1*  AST 22  ALT 15*    ALKPHOS 100  BILITOT 0.5    Recent Labs  09/21/15 0245  CHOL 203*   No results for input(s): PROTIME in the last 72 hours.     Studies/Results: ECHO: ------------------------------------------------------------------- Study Conclusions  - Left ventricle: The cavity size was normal. Septal wall thickness   was increased in a pattern of moderate LVH with severe   hypertrophy of the posterior wall. Systolic function was normal.   The estimated ejection fraction was in the range of 60% to 65%.   Wall motion was normal; there were no regional wall motion   abnormalities. Doppler parameters are consistent with abnormal   left ventricular relaxation (grade 1 diastolic dysfunction).   Doppler parameters are consistent with indeterminate ventricular   filling pressure. - Aortic valve: Transvalvular velocity was within the normal range.   There was no stenosis. There was no regurgitation. - Mitral valve: Transvalvular velocity was within the normal range.   There was no evidence for stenosis. There was trivial   regurgitation. - Right ventricle: The cavity size was normal. Wall thickness was   normal. Systolic function was normal. - Tricuspid valve: There was no regurgitation. - Pulmonic valve: There was no regurgitation.   Medications: I have reviewed the patient's current medications. Scheduled Meds: . amLODipine  5 mg Oral Daily  . aspirin EC  81 mg  Oral Once  . atorvastatin  80 mg Oral q1800  . calcitRIOL      . calcitRIOL  0.25 mcg Oral Q M,W,F-HD  . calcium acetate  2,001 mg Oral TID WC  . citalopram  60 mg Oral QHS  . clonazePAM  2 mg Oral QHS  . darbepoetin (ARANESP) injection - DIALYSIS  40 mcg Intravenous Q Wed-HD  . gabapentin  100 mg Oral TID  . gi cocktail  30 mL Oral Once  . heparin  8,000 Units Dialysis Once in dialysis  . insulin aspart  0-15 Units Subcutaneous TID WC  . insulin glargine  32 Units Subcutaneous QHS  . levothyroxine  50 mcg Oral QAC  breakfast  . metoprolol tartrate  75 mg Oral BID  . omega-3 acid ethyl esters  2 g Oral BID  . promethazine      . sodium chloride flush  3 mL Intravenous Q12H   Continuous Infusions: . sodium chloride    . heparin 1,650 Units/hr (09/21/15 2123)   PRN Meds:.sodium chloride, sodium chloride, sodium chloride, acetaminophen, alteplase, gi cocktail, heparin, lidocaine (PF), lidocaine-prilocaine, metoCLOPramide, MUSCLE RUB, pentafluoroprop-tetrafluoroeth, sodium chloride flush  Assessment/Plan: Active Problems:   DKA (diabetic ketoacidoses) (HCC)   Hyperglycemia due to type 2 diabetes mellitus (HCC)  1. NSTEMI: Peak troponin 2.10 now trending downward at 1.68. ECG without acute ST changes but QS V1-4. Currently on heparin. Echo with moderate LVH with severe hypertrophy of the posterior  wall. Systolic function was normal.   The estimated ejection fraction was in the range of 60% to 65% -for cardiac cath today - on CT of chest from July + LAD atherosclerosis.    2. HTN: improved today with resumption of amlodipine 5 mg and metoprolol 75 mg bid.  With dialysis decreased to 108/66.   3. Uncontrolled DM: normal anion gap; treated with iv insulin now on subcu,  followed by medicine service.   4. Hyperlipidemia with an atherogenic panel with increased TG, low HDL increased VLDL; suspect significantly increased LDL particles despite LDL only 111; with DM needs aggressive intervention; recommend high potency statin with crestor 40 mg or atorvastatin 80 and add lovaza or vascepa 2 capsule bid. Consider fenofibrate Rx. Now on lipitor 80  5. ESRD on dialysis back from dialysis meds held prior to dialysis  6. Anemia of CKD  today 9.1   7. Hypothyroidism on synthroid replacement.    LOS: 2 days   Time spent with pt. :15 minutes. Nada Boozer  Nurse Practitioner Certified Pager [phone] or after 5pm and on weekends call [phone] 09/22/2015, 2:11 PM  Patient seen and examined. Agree with  assessment and plan. Back from dialysis; tolerated well today.  No recurrent chest pain. Now on aggressive lipid management. Glucose rwemains elevated; will need significantly improved control. For cath today.   Lennette Bihari, MD, [facility] 09/22/2015 2:45 PM

## 2015-09-22 NOTE — Progress Notes (Signed)
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 male who presents with a chief complaint chest pain. In anticipation of discharge, pharmacy has reviewed this patient's prior to admission medication history, as well as current inpatient medications [facility].  Current medication indications, dosing, frequency, and notable side effects reviewed with patient and family. patient and family verbalized understanding of current inpatient medication regimen and are aware that the After Visit Summary when presented, will represent the most accurate medication list at discharge.   Purvis SheffieldSteven B Cermak expressed concerns regarding need for statin as he believes his cholesterol is OK.   Assessment: Understanding of regimen: fair Understanding of indications: fair Potential of compliance: fair Barriers to Obtaining Medications: No  Patient instructed to contact inpatient pharmacy team with further questions or concerns if needed.    Time spent preparing for discharge counseling: 5 min Time spent counseling patient: 25 min   Thank you for allowing pharmacy to be a part of this patient's care.  Allena Katzaroline E Demisha Nokes, Pharm.D. PGY1 Pharmacy Resident 9/20/20177:54 PM Pager [phone]

## 2015-09-22 NOTE — Progress Notes (Signed)
Subjective: Still with mild chest pressure that has never resolved.    Objective: Vital signs in last 24 hours: Temp:  [97.7 F (36.5 C)-98.3 F (36.8 C)] 98.1 F (36.7 C) (09/20 1233) Pulse Rate:  [72-91] 72 (09/20 1400) Resp:  [12-18] 12 (09/20 1400) BP: (93-155)/(64-100) 129/92 (09/20 1400) SpO2:  [94 %-100 %] 96 % (09/20 1400) Weight:  [260 lb 2.3 oz (118 kg)-267 lb 3.2 oz (121.2 kg)] 260 lb 2.3 oz (118 kg) (09/20 1142) Weight change:  Last BM Date: 09/21/15 Intake/Output from previous day: 09/19 0701 - 09/20 0700 In: 398.8 [I.V.:398.8] Out: 650 [Urine:650] Intake/Output this shift: No intake/output data recorded.  PE: General:Pleasant affect, NAD Skin:Warm and dry, brisk capillary refill HEENT:normocephalic, sclera clear, mucus membranes moist Neck:supple, no JVD, no bruits  Heart:S1S2 RRR without murmur, gallup, rub or click Lungs:clear without rales, rhonchi, or wheezes ZOX:WRUEAbd:soft, non tender, + BS, do not palpate liver spleen or masses Ext:no lower ext edema, 2+ pedal pulses, 2+ radial pulses Neuro:alert and oriented X 3, MAE, follows commands, + facial symmetry Tele:  SR Lab Results:  Recent Labs  09/21/15 0245 09/22/15 0226  WBC 7.0 6.4  HGB 9.4* 9.1*  HCT 28.8* 28.2*  PLT 290 295   BMET  Recent Labs  09/21/15 0623 09/22/15 0226  NA 136 132*  K 3.8 3.9  CL 100* 100*  CO2 24 21*  GLUCOSE 209* 336*  BUN 48* 59*  CREATININE 5.89* 6.75*  CALCIUM 8.4* 8.5*    Recent Labs  09/20/15 2201 09/21/15 0027  TROPONINI 1.83* 1.68*    Lab Results  Component Value Date   CHOL 203 (H) 09/21/2015   HDL 33 (L) 09/21/2015   LDLCALC 111 (H) 09/21/2015   TRIG 294 (H) 09/21/2015   CHOLHDL 6.2 09/21/2015   Lab Results  Component Value Date   HGBA1C 16.8 (H) 07/20/2015     Lab Results  Component Value Date   TSH 1.243 07/20/2015    Hepatic Function Panel  Recent Labs  09/20/15 1202  PROT 6.5  ALBUMIN 3.1*  AST 22  ALT 15*    ALKPHOS 100  BILITOT 0.5    Recent Labs  09/21/15 0245  CHOL 203*   No results for input(s): PROTIME in the last 72 hours.     Studies/Results: ECHO: ------------------------------------------------------------------- Study Conclusions  - Left ventricle: The cavity size was normal. Septal wall thickness   was increased in a pattern of moderate LVH with severe   hypertrophy of the posterior wall. Systolic function was normal.   The estimated ejection fraction was in the range of 60% to 65%.   Wall motion was normal; there were no regional wall motion   abnormalities. Doppler parameters are consistent with abnormal   left ventricular relaxation (grade 1 diastolic dysfunction).   Doppler parameters are consistent with indeterminate ventricular   filling pressure. - Aortic valve: Transvalvular velocity was within the normal range.   There was no stenosis. There was no regurgitation. - Mitral valve: Transvalvular velocity was within the normal range.   There was no evidence for stenosis. There was trivial   regurgitation. - Right ventricle: The cavity size was normal. Wall thickness was   normal. Systolic function was normal. - Tricuspid valve: There was no regurgitation. - Pulmonic valve: There was no regurgitation.   Medications: I have reviewed the patient's current medications. Scheduled Meds: . amLODipine  5 mg Oral Daily  . aspirin EC  81 mg  Oral Once  . atorvastatin  80 mg Oral q1800  . calcitRIOL      . calcitRIOL  0.25 mcg Oral Q M,W,F-HD  . calcium acetate  2,001 mg Oral TID WC  . citalopram  60 mg Oral QHS  . clonazePAM  2 mg Oral QHS  . darbepoetin (ARANESP) injection - DIALYSIS  40 mcg Intravenous Q Wed-HD  . gabapentin  100 mg Oral TID  . gi cocktail  30 mL Oral Once  . heparin  8,000 Units Dialysis Once in dialysis  . insulin aspart  0-15 Units Subcutaneous TID WC  . insulin glargine  32 Units Subcutaneous QHS  . levothyroxine  50 mcg Oral QAC  breakfast  . metoprolol tartrate  75 mg Oral BID  . omega-3 acid ethyl esters  2 g Oral BID  . promethazine      . sodium chloride flush  3 mL Intravenous Q12H   Continuous Infusions: . sodium chloride    . heparin 1,650 Units/hr (09/21/15 2123)   PRN Meds:.sodium chloride, sodium chloride, sodium chloride, acetaminophen, alteplase, gi cocktail, heparin, lidocaine (PF), lidocaine-prilocaine, metoCLOPramide, MUSCLE RUB, pentafluoroprop-tetrafluoroeth, sodium chloride flush  Assessment/Plan: Active Problems:   DKA (diabetic ketoacidoses) (HCC)   Hyperglycemia due to type 2 diabetes mellitus (HCC)  1. NSTEMI: Peak troponin 2.10 now trending downward at 1.68. ECG without acute ST changes but QS V1-4. Currently on heparin. Echo with moderate LVH with severe hypertrophy of the posterior  wall. Systolic function was normal.   The estimated ejection fraction was in the range of 60% to 65% -for cardiac cath today - on CT of chest from July + LAD atherosclerosis.    2. HTN: improved today with resumption of amlodipine 5 mg and metoprolol 75 mg bid.  With dialysis decreased to 108/66.   3. Uncontrolled DM: normal anion gap; treated with iv insulin now on subcu,  followed by medicine service.   4. Hyperlipidemia with an atherogenic panel with increased TG, low HDL increased VLDL; suspect significantly increased LDL particles despite LDL only 111; with DM needs aggressive intervention; recommend high potency statin with crestor 40 mg or atorvastatin 80 and add lovaza or vascepa 2 capsule bid. Consider fenofibrate Rx. Now on lipitor 80  5. ESRD on dialysis back from dialysis meds held prior to dialysis  6. Anemia of CKD  today 9.1   7. Hypothyroidism on synthroid replacement.    LOS: 2 days   Time spent with pt. :15 minutes. Nada Boozer  Nurse Practitioner Certified Pager [phone] or after 5pm and on weekends call [phone] 09/22/2015, 2:11 PM  Patient seen and examined. Agree with  assessment and plan. Back from dialysis; tolerated well today.  No recurrent chest pain. Now on aggressive lipid management. Glucose rwemains elevated; will need significantly improved control. For cath today.   Lennette Bihari, MD, [facility] 09/22/2015 2:45 PM

## 2015-09-22 NOTE — Consult Note (Signed)
[facility] Renal Consultation Note    Indication for Consultation:  Management of ESRD/hemodialysis; anemia, hypertension/volume and secondary hyperparathyroidism PCP:  HPI: Russell [name] is a 45 y.o. male with ESRD 2/2 DM/HTN on hemodialysis since 12/16 at [facility]. PMH DMT2, DKA, HTN, horseshoe kidney, macular degeneration, headaches, hypothyroidism, PNA, asthma, morbid obesity, SHPT, anemia of chronic disease.   Patient states he was on hemodialysis Friday and felt "they were pulling too much fluid". Says he started having substernal chest pain with associated nausea, diaphoresis, mild SOB and headache L side of head above eye extending to back of neck.. Says pain radiated to neck and down both arms. Patient ran full treatment and went home. Says he did not take anything for chest pain, that he went home and stayed in bed all weekend. Went back to HD Monday, chest pain still present. Says he signed off early and drove to [facility] ED for evaluation.   On arrival to ED, BS was 754, anion gap 12. K+ 4.5. EKG showed SR, q waves in anterior leads QTc 49. Initial lab Troponin 1.71, troponin level peaked at 2.10. He was admitted for management of hyperglycemia/chest pain. He was started on insulin drip/heparin drip. Cardiology was consulted. He is going for cardiac cath today.   Patient is being seen on hemodialysis. Says he is still having 8/10 chest pain although he is lying flat quietly and appears in NAD. Says he has not been given anything for pain except tylenol. Also C/O persistent HA L occipital area radiating to neck. Denies N,V,D, fevers, chills, C/O RUQ abdominal pain, denies hematemesis, tarry stools or hematochezia. Denies vision or hearing changes, no syncope, falls. Last BS 272.   Patient has HD at [facility]. He usually does not miss treatments or sign off early. Has high IDWG-I have discussed this issue with him many time as I have been concerned  about effects of high gains/ volume on his heart.  He usually gets to EDW, has history of chronic noncompliance with diet and binders.   Past Medical History:  Diagnosis Date  . Age-related macular degeneration, wet, both eyes (HCC)    "I'm getting Alia treatments" (12/30/2014)  . Anxiety   . Arthritis    "hands" (12/30/2014)  . Asthma   . 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 Daily headache   . Depression   . DVT (deep venous thrombosis), H/o 01/2014-on Xarelto 03/24/2014   LLE  . Hypertension   . Hypothyroidism   . Nephrotic syndrome 05/18/2014  . Pneumonia 11/2013  . Renal insufficiency   . Secondary DM with DKA-AG=16, BIcarb Nl 03/24/2014  . Type 2 diabetes mellitus with diabetic nephropathy [facility])    Past Surgical History:  Procedure Laterality Date  . ANKLE FRACTURE SURGERY Right 1988  . AV FISTULA PLACEMENT Left 01/01/2015   Procedure: CREATION OF LEFT RADIAL CEPHALIC ARTERIOVENOUS (AV) FISTULA ;  Surgeon: Pryor Ochoa, MD;  Location: [facility] OR;  Service: Vascular;  Laterality: Left;  . ESOPHAGOGASTRODUODENOSCOPY N/A 03/29/2014   Procedure: ESOPHAGOGASTRODUODENOSCOPY (EGD);  Surgeon: Dorena Cookey, MD;  Location: [facility];  Service: Endoscopy;  Laterality: N/A;  . EYE SURGERY    . FRACTURE SURGERY    . INSERTION OF DIALYSIS CATHETER Right 01/05/2015   Procedure: INSERTION OF RIGHT INTERNAL JUGULAR DIALYSIS CATHETER;  Surgeon: Fransisco Hertz, MD;  Location: [facility] OR;  Service: Vascular;  Laterality: Right;  . TONSILLECTOMY AND ADENOIDECTOMY  1970s  Family History  Problem Relation Age of Onset  . Obesity Mother     Patient states that family members have no other medical illnesses other than what I have described  . Kidney cancer Maternal Grandmother   . Cancer Father 50    AML  . Heart disease      No family history   Social History:  reports that he has never smoked. He has never used smokeless tobacco. He reports that he does not drink alcohol or use  drugs. Allergies  Allergen Reactions  . Ibuprofen Other (See Comments)    MD told him not to take due to kidney disease.  . Nsaids Other (See Comments)    Told to avoid all nsaids due to kidney disease-takes acetaminophen as needed   Prior to Admission medications   Medication Sig Start Date End Date Taking? Authorizing Provider  acetaminophen (TYLENOL) 500 MG tablet Take 1 tablet (500 mg total) by mouth every 6 (six) hours as needed for moderate pain. Patient taking differently: Take 1,000 mg by mouth every 6 (six) hours as needed for moderate pain.  09/29/14  Yes Albertine GratesFang Xu, MD  albuterol (PROVENTIL HFA;VENTOLIN HFA) 108 (90 Base) MCG/ACT inhaler Inhale 1 puff into the lungs every 6 (six) hours as needed for wheezing or shortness of breath. 03/11/15  Yes [name], MD  albuterol-ipratropium (COMBIVENT) 18-103 MCG/ACT inhaler Inhale 2 puffs into the lungs every 4 (four) hours. Patient taking differently: Inhale 2 puffs into the lungs every 6 (six) hours as needed for wheezing or shortness of breath.  09/03/14  Yes [name], MD  amLODipine (NORVASC) 10 MG tablet Take 1 tablet (10 mg total) by mouth daily. 05/22/15  Yes [name], MD  calcium acetate (PHOSLO) 667 MG capsule Take 2 capsules (1,334 mg total) by mouth 3 (three) times daily with meals. 01/08/15  Yes Rolly SalterPranav M Patel, MD  citalopram (CELEXA) 40 MG tablet Take 1.5 tablets (60 mg total) by mouth at bedtime. 05/22/15  Yes [name], MD  clonazePAM (KLONOPIN) 2 MG tablet Take 2 mg by mouth at bedtime.   Yes Historical Provider, MD  cloNIDine (CATAPRES) 0.1 MG tablet Take 1 tablet (0.1 mg total) by mouth 2 (two) times daily. Patient taking differently: Take 0.2 mg by mouth 2 (two) times daily.  01/08/15  Yes Rolly SalterPranav M Patel, MD  cyclobenzaprine (FLEXERIL) 10 MG tablet Take 1 tablet (10 mg total) by mouth at bedtime. 05/22/15  Yes [name], MD  gabapentin (NEURONTIN) 100 MG capsule Take 2 capsules (200 mg total) by mouth 3  (three) times daily. Patient taking differently: Take 100-200 mg by mouth 4 (four) times daily as needed.  05/22/15  Yes [name], MD  HYDROcodone-acetaminophen (NORCO/VICODIN) 5-325 MG tablet Take 1 tablet by mouth every 4 (four) hours as needed for pain.   Yes Historical Provider, MD  HYDROmorphone (DILAUDID) 2 MG tablet Take 1 tablet (2 mg total) by mouth every 8 (eight) hours as needed for severe pain. Patient taking differently: Take 2-4 mg by mouth every 8 (eight) hours as needed for severe pain.  05/22/15  Yes [name], MD  hydrOXYzine (ATARAX/VISTARIL) 25 MG tablet Take 25 mg by mouth 2 (two) times daily as needed for anxiety.    Yes Historical Provider, MD  insulin glargine (LANTUS) 100 UNIT/ML injection Inject 60 Units into the skin 2 (two) times daily.   Yes Historical Provider, MD  insulin lispro (HUMALOG) 100 UNIT/ML injection Inject 15-30 Units into the skin every  evening. Take one unit per 5 carbs depending on dinner.   Yes Historical Provider, MD  levothyroxine (SYNTHROID, LEVOTHROID) 50 MCG tablet Take 1 tablet (50 mcg total) by mouth daily. 05/22/15  Yes [name], MD  metoCLOPramide (REGLAN) 5 MG tablet Take 1 tablet (5 mg total) by mouth every 8 (eight) hours as needed for nausea. 05/22/15  Yes [name], MD  metoprolol succinate (TOPROL-XL) 100 MG 24 hr tablet Take 1 tablet (100 mg total) by mouth daily. Patient taking differently: Take 100 mg by mouth at bedtime as needed. BP greater than 170 05/22/15  Yes [name], MD  promethazine (PHENERGAN) 25 MG tablet Take 1 tablet (25 mg total) by mouth every 8 (eight) hours as needed for nausea. 05/22/15  Yes [name], MD  omeprazole (PRILOSEC) 40 MG capsule Take 1 capsule (40 mg total) by mouth 2 (two) times daily. Patient not taking: Reported on 09/20/2015 07/20/15   Milagros Loll, MD  polyethylene glycol [facility]) packet Take 17 g by mouth daily. Patient not taking: Reported on 09/20/2015 07/20/15    Milagros Loll, MD  terbinafine (LAMISIL) 250 MG tablet Take 1 tablet (250 mg total) by mouth daily. Patient not taking: Reported on 09/20/2015 05/22/15   [name], MD   Current Facility-Administered Medications  Medication Dose Route Frequency Provider Last Rate Last Dose  . 0.9 %  sodium chloride infusion  250 mL Intravenous PRN Leone Brand, NP      . 0.9 %  sodium chloride infusion   Intravenous Continuous Tonny Bollman, MD      . acetaminophen (TYLENOL) 325 MG tablet           . acetaminophen (TYLENOL) tablet 500 mg  500 mg Oral Q6H PRN Joanna Puff, MD   500 mg at 09/22/15 0518  . amLODipine (NORVASC) tablet 5 mg  5 mg Oral Daily Leone Brand, NP   5 mg at 09/21/15 0944  . aspirin EC tablet 81 mg  81 mg Oral Once Garth Bigness, MD      . atorvastatin (LIPITOR) tablet 80 mg  80 mg Oral q1800 Lennette Bihari, MD   80 mg at 09/21/15 1701  . calcitRIOL (ROCALTROL) 0.25 MCG capsule           . calcitRIOL (ROCALTROL) capsule 0.25 mcg  0.25 mcg Oral Q M,W,F-HD Pola Corn, NP      . calcium acetate (PHOSLO) capsule 1,334 mg  1,334 mg Oral TID WC Joanna Puff, MD   1,334 mg at 09/21/15 1701  . citalopram (CELEXA) tablet 60 mg  60 mg Oral QHS Joanna Puff, MD   60 mg at 09/21/15 2123  . clonazePAM (KLONOPIN) tablet 2 mg  2 mg Oral QHS Joanna Puff, MD   2 mg at 09/21/15 2123  . ferric gluconate (NULECIT) 62.5 mg in sodium chloride 0.9 % 100 mL IVPB  62.5 mg Intravenous Once Pola Corn, NP      . gabapentin (NEURONTIN) capsule 100 mg  100 mg Oral TID Joanna Puff, MD      . gi cocktail (Maalox,Lidocaine,Donnatal)  30 mL Oral QID PRN Joanna Puff, MD      . gi cocktail (Maalox,Lidocaine,Donnatal)  30 mL Oral Once Joanna Puff, MD      . heparin ADULT infusion 100 units/mL (25000 units/245mL sodium chloride 0.45%)  1,650 Units/hr Intravenous Continuous Nestor Ramp, MD 16.5 mL/hr at 09/21/15 2123 1,650 Units/hr at 09/21/15 2123  .  insulin  aspart (novoLOG) injection 0-15 Units  0-15 Units Subcutaneous TID WC Campbell Stall, MD   8 Units at 09/21/15 1700  . insulin glargine (LANTUS) injection 32 Units  32 Units Subcutaneous QHS Campbell Stall, MD   32 Units at 09/21/15 2123  . levothyroxine (SYNTHROID, LEVOTHROID) tablet 50 mcg  50 mcg Oral QAC breakfast Joanna Puff, MD   50 mcg at 09/21/15 [phone]  . metoCLOPramide (REGLAN) tablet 5 mg  5 mg Oral Q8H PRN Joanna Puff, MD   5 mg at 09/22/15 0457  . metoprolol tartrate (LOPRESSOR) tablet 75 mg  75 mg Oral BID Lennette Bihari, MD   75 mg at 09/21/15 2123  . omega-3 acid ethyl esters (LOVAZA) capsule 2 g  2 g Oral BID Lennette Bihari, MD   2 g at 09/21/15 2123  . sodium chloride flush (NS) 0.9 % injection 3 mL  3 mL Intravenous Q12H Leone Brand, NP   3 mL at 09/21/15 2200  . sodium chloride flush (NS) 0.9 % injection 3 mL  3 mL Intravenous PRN Leone Brand, NP       Labs: Basic Metabolic Panel:  Recent Labs Lab 09/21/15 0245 09/21/15 0623 09/22/15 0226  NA 136 136 132*  K 4.0 3.8 3.9  CL 101 100* 100*  CO2 23 24 21*  GLUCOSE 197* 209* 336*  BUN 47* 48* 59*  CREATININE 5.67* 5.89* 6.75*  CALCIUM 8.6* 8.4* 8.5*   Liver Function Tests:  Recent Labs Lab 09/20/15 1202  AST 22  ALT 15*  ALKPHOS 100  BILITOT 0.5  PROT 6.5  ALBUMIN 3.1*   CBC:  Recent Labs Lab 09/20/15 1202 09/21/15 0245 09/22/15 0226  WBC 6.3 7.0 6.4  HGB 9.3* 9.4* 9.1*  HCT 27.6* 28.8* 28.2*  MCV 82.9 84.2 86.0  PLT 262 290 295   Cardiac Enzymes:  Recent Labs Lab 09/20/15 1542 09/20/15 1855 09/20/15 2201 09/21/15 0027  TROPONINI 1.71* 2.10* 1.83* 1.68*   CBG:  Recent Labs Lab 09/21/15 1225 09/21/15 1648 09/21/15 1925 09/22/15 0013 09/22/15 0353  GLUCAP 281* 233* 264* 352* 272*   Iron Studies: No results for input(s): IRON, TIBC, TRANSFERRIN, FERRITIN in the last 72 hours. Studies/Results: Dg Chest 2 View  Result Date: 09/20/2015 CLINICAL DATA:  Chest pain and  shortness of breath. EXAM: CHEST  2 VIEW COMPARISON:  07/19/2015 FINDINGS: The heart size and mediastinal contours are within normal limits. Both lungs are clear. The visualized skeletal structures are unremarkable. IMPRESSION: No active cardiopulmonary disease. Electronically Signed   By: Kennith Center M.D.   On: 09/20/2015 13:23    ROS: As per HPI otherwise negative.  Review of Systems:  Physical Exam: Vitals:   09/22/15 0747 09/22/15 0830 09/22/15 0900 09/22/15 0930  BP: 120/78 130/84 111/68 105/64  Pulse: 76 76 75 76  Resp:      Temp:      TempSrc:      SpO2:      Weight:      Height:         General: Well developed, well nourished, in no acute distress. Head: Normocephalic, atraumatic, sclera non-icteric, mucus membranes are moist Neck: Supple. JVD not elevated. Lungs: Clear bilaterally to auscultation without wheezes, rales, or rhonchi. Breathing is unlabored. Heart: RRR with S1 S2 1/6 systolic M. SR on monitor.  Abdomen: Soft, tender RUQ. Active BS, non-distended.  M-S:  Strength and tone appear normal for age. Lower extremities: No LE edema.  Has open callous at bottom of R. Great toe.   Neuro: Alert and oriented X 3. Moves all extremities spontaneously. Psych:  Responds to questions appropriately with a normal affect. Dialysis Access: RFA AVF cannulated at present  Dialysis Orders: [facility] MWF 4 hours 450/800 117 kg 2.0 K/2.5 Ca bath. UF profile 4 Heparin 8000 units IV at start of tx Heparin 3000 units IV mid run Mircera 75 mcg IV q 2 weeks (last dose 09/15/15 Has HGB 10.0 09/15/15) Venofer 50 mg IV weekly (last dose 09*131/17 Fe 75 Tsat 29 08/18/15) Calcitriol 0.25 mcg PO q treatment (last PTH 424 08/18/15)  Ca 8.7 C Ca 8.9  Phos 7.6 (08/18/15) Takes Phoslo 667 mg 3 capsules PO TID AC and 2 capsules with snacks.  Assessment/Plan: 1.  Hyperglycemia: Per primary. Off insulin drip/on SS insulin/Lantus resumed. BS 272.  2.  Chest Pain: Troponin peak  2.1. Echo 09/19 shows mod LVH with posterior hypertrophy posterior wall. EF 60-65%. For cardiac cath today.  3.  ESRD -  MWF [place]. On HD today. K+ 3.9. 4.0 K bath.  4.  Hypertension/volume  - BP controlled on amlodipine/metoprolol. Pre wt 121.2 UFG 4000. Tolerating HD well.  5.  Anemia  - 9.1. Give additional ESA today-40 mcg IV, weekly venofer with HD today.   6.  Metabolic bone disease -  Continue binder/VDRA. Add phos/Albumin to labs.  7.  Nutrition - NPO for cardiac cath. 8.  Hypothyroid: Per primary 9.    H/O Headaches: Per primary. Probably tension HA. Tylenol ordered for pain.  Rita H. Manson Passey, NP-C 09/22/2015, 9:39 AM  [employer] [phone]  Pt seen, examined, agree w assess/plan as above with additions as indicated. ESRD pt w several days of chest pain and +trop 1.5- 2.0 range.  Cardiology planning heart catheterization.  HD already done this morning.  [name] MD [employer] pager [phone]    cell [phone] 09/22/2015, 1:01 PM

## 2015-09-22 NOTE — Progress Notes (Addendum)
ANTICOAGULATION CONSULT NOTE  Pharmacy Consult:  Heparin Indication: chest pain/ACS  Allergies  Allergen Reactions  . Ibuprofen Other (See Comments)    MD told him not to take due to kidney disease.  . Nsaids Other (See Comments)    Told to avoid all nsaids due to kidney disease-takes acetaminophen as needed    Patient Measurements: Height: 6\' 2"  (188 cm) Weight: 267 lb 3.2 oz (121.2 kg) IBW/kg (Calculated) : 82.2 Heparin Dosing Weight: 108 kg   Vital Signs: Temp: 97.8 F (36.6 C) (09/20 0742) Temp Source: Oral (09/20 0742) BP: 130/84 (09/20 0830) Pulse Rate: 76 (09/20 0830)  Labs:  Recent Labs  09/20/15 1202  09/20/15 1542 09/20/15 1854 09/20/15 1855 09/20/15 2201 09/21/15 0027 09/21/15 0245 09/21/15 0623 09/21/15 1006 09/22/15 0226 09/22/15 0620  HGB 9.3*  --   --   --   --   --   --  9.4*  --   --  9.1*  --   HCT 27.6*  --   --   --   --   --   --  28.8*  --   --  28.2*  --   PLT 262  --   --   --   --   --   --  290  --   --  295  --   APTT  --   --  28  --   --   --   --   --   --   --   --   --   LABPROT  --   --   --  14.7  --   --   --  14.2  --   --  12.4  --   INR  --   --   --  1.15  --   --   --  1.09  --   --  0.93  --   HEPARINUNFRC  --   --   --   --   --   --  0.29*  --   --  0.50  --  0.38  CREATININE 4.89*  --   --   --  5.26* 5.39*  --  5.67* 5.89*  --  6.75*  --   TROPONINI  --   < > 1.71*  --  2.10* 1.83* 1.68*  --   --   --   --   --   < > = values in this interval not displayed.  Estimated Creatinine Clearance: 19.3 mL/min (by C-G formula based on SCr of 6.75 mg/dL (H)).   Assessment: 45 y.o. male with chest pain to continue on IV heparin.  Heparin level therapeutic at 0.38 units/mL this morning.   Hgb 9.1, platelets WNL. No bleeding reported.  Goal of Therapy:  Heparin level 0.3-0.7 units/ml Monitor platelets by anticoagulation protocol: Yes   Plan:  - Continue heparin drip at 1650 units/hr - Daily heparin level and CBC -  Follow cardiology plans for cath  Kaela Beitz D. Carlyle Achenbach, PharmD, BCPS Clinical Pharmacist Pager: [phone] 09/22/2015 8:57 AM

## 2015-09-22 NOTE — Progress Notes (Signed)
ANTICOAGULATION CONSULT NOTE  Pharmacy Consult:  Heparin Indication: chest pain/ACS  Allergies  Allergen Reactions  . Ibuprofen Other (See Comments)    MD told him not to take due to kidney disease.  . Nsaids Other (See Comments)    Told to avoid all nsaids due to kidney disease-takes acetaminophen as needed    Patient Measurements: Height: 6\' 2"  (188 cm) Weight: 260 lb 2.3 oz (118 kg) (standing wt.) IBW/kg (Calculated) : 82.2 Heparin Dosing Weight: 108 kg   Vital Signs: Temp: 98.1 F (36.7 C) (09/20 1233) Temp Source: Oral (09/20 1233) BP: 164/87 (09/20 1620) Pulse Rate: 75 (09/20 1620)  Labs:  Recent Labs  09/20/15 1202  09/20/15 1542 09/20/15 1854 09/20/15 1855 09/20/15 2201 09/21/15 0027 09/21/15 0245 09/21/15 0623 09/21/15 1006 09/22/15 0226 09/22/15 0620  HGB 9.3*  --   --   --   --   --   --  9.4*  --   --  9.1*  --   HCT 27.6*  --   --   --   --   --   --  28.8*  --   --  28.2*  --   PLT 262  --   --   --   --   --   --  290  --   --  295  --   APTT  --   --  28  --   --   --   --   --   --   --   --   --   LABPROT  --   --   --  14.7  --   --   --  14.2  --   --  12.4  --   INR  --   --   --  1.15  --   --   --  1.09  --   --  0.93  --   HEPARINUNFRC  --   --   --   --   --   --  0.29*  --   --  0.50  --  0.38  CREATININE 4.89*  --   --   --  5.26* 5.39*  --  5.67* 5.89*  --  6.75*  --   TROPONINI  --   < > 1.71*  --  2.10* 1.83* 1.68*  --   --   --   --   --   < > = values in this interval not displayed.  Estimated Creatinine Clearance: 19.1 mL/min (by C-G formula based on SCr of 6.75 mg/dL (H)).  Assessment: 45 y.o. male with chest pain to continue on IV heparin post cardiac catheterization.  Heparin level therapeutic at 0.38 units/mL this morning prior to procedure.  Hgb 9.1stable, platelets WNL. TR band still in place and will restart heparin conservatively.  Goal of Therapy:  Heparin level 0.3-0.7 units/ml Monitor platelets by anticoagulation  protocol: Yes   Plan:  - Restart heparin drip at 1400 units/hr - Confirm 6 hour level - Daily heparin level and CBC - Follow cardiology plans for CABG  Ruben Imony Cashe Gatt, PharmD Clinical Pharmacist Pager: [phone] 09/22/2015 5:29 PM

## 2015-09-22 NOTE — Interval H&P Note (Signed)
Cath Lab Visit (complete for each Cath Lab visit)  Clinical Evaluation Leading to the Procedure:   ACS: Yes.    Non-ACS:    Anginal Classification: CCS IV  Anti-ischemic medical therapy: Minimal Therapy (1 class of medications)  Non-Invasive Test Results: No non-invasive testing performed  Prior CABG: No previous CABG      History and Physical Interval Note:  09/22/2015 3:37 PM  Russell [name]  has presented today for surgery, with the diagnosis of n stemi  The various methods of treatment have been discussed with the patient and family. After consideration of risks, benefits and other options for treatment, the patient has consented to  Procedure(s): Left Heart Cath and Coronary Angiography (N/A) as a surgical intervention .  The patient's history has been reviewed, patient examined, no change in status, stable for surgery.  I have reviewed the patient's chart and labs.  Questions were answered to the patient's satisfaction.     Lorine BearsMuhammad Wynter Isaacs

## 2015-09-23 ENCOUNTER — Encounter (HOSPITAL_COMMUNITY): Payer: Self-pay | Admitting: Cardiovascular Disease

## 2015-09-23 DIAGNOSIS — E119 Type 2 diabetes mellitus without complications: Secondary | ICD-10-CM

## 2015-09-23 DIAGNOSIS — I2510 Atherosclerotic heart disease of native coronary artery without angina pectoris: Secondary | ICD-10-CM | POA: Diagnosis present

## 2015-09-23 LAB — CBC
HCT: 29.7 % — ABNORMAL LOW (ref 39.0–52.0)
HEMOGLOBIN: 9.5 g/dL — AB (ref 13.0–17.0)
MCH: 28.1 pg (ref 26.0–34.0)
MCHC: 32 g/dL (ref 30.0–36.0)
MCV: 87.9 fL (ref 78.0–100.0)
PLATELETS: 286 10*3/uL (ref 150–400)
RBC: 3.38 MIL/uL — ABNORMAL LOW (ref 4.22–5.81)
RDW: 16.5 % — AB (ref 11.5–15.5)
WBC: 7.2 10*3/uL (ref 4.0–10.5)

## 2015-09-23 LAB — GLUCOSE, CAPILLARY
GLUCOSE-CAPILLARY: 178 mg/dL — AB (ref 65–99)
Glucose-Capillary: 154 mg/dL — ABNORMAL HIGH (ref 65–99)
Glucose-Capillary: 266 mg/dL — ABNORMAL HIGH (ref 65–99)
Glucose-Capillary: 220 mg/dL — ABNORMAL HIGH (ref 65–99)

## 2015-09-23 LAB — RENAL FUNCTION PANEL
ALBUMIN: 2.8 g/dL — AB (ref 3.5–5.0)
ANION GAP: 9 (ref 5–15)
BUN: 31 mg/dL — AB (ref 6–20)
CALCIUM: 8.2 mg/dL — AB (ref 8.9–10.3)
CO2: 29 mmol/L (ref 22–32)
Chloride: 98 mmol/L — ABNORMAL LOW (ref 101–111)
Creatinine, Ser: 5.02 mg/dL — ABNORMAL HIGH (ref 0.61–1.24)
GFR calc Af Amer: 15 mL/min — ABNORMAL LOW (ref >60)
GFR calc non Af Amer: 13 mL/min — ABNORMAL LOW (ref >60)
GLUCOSE: 170 mg/dL — AB (ref 65–99)
PHOSPHORUS: 6 mg/dL — AB (ref 2.5–4.6)
Potassium: 3.6 mmol/L (ref 3.5–5.1)
SODIUM: 136 mmol/L (ref 135–145)

## 2015-09-23 LAB — PROTIME-INR
INR: 1.08
PROTHROMBIN TIME: 14 s (ref 11.4–15.2)

## 2015-09-23 LAB — HEPARIN LEVEL (UNFRACTIONATED)
HEPARIN UNFRACTIONATED: 0.37 [IU]/mL (ref 0.30–0.70)
Heparin Unfractionated: 0.2 IU/mL — ABNORMAL LOW (ref 0.30–0.70)

## 2015-09-23 MED ORDER — RENA-VITE PO TABS
1.0000 | ORAL_TABLET | Freq: Every day | ORAL | Status: DC
  Administered 2015-09-23 – 2015-10-04 (×7): 1 via ORAL
  Filled 2015-09-23 (×8): qty 1

## 2015-09-23 MED ORDER — INSULIN ASPART 100 UNIT/ML ~~LOC~~ SOLN
0.0000 [IU] | Freq: Every day | SUBCUTANEOUS | Status: DC
  Administered 2015-09-23 – 2015-09-24 (×2): 2 [IU] via SUBCUTANEOUS
  Administered 2015-09-25: 3 [IU] via SUBCUTANEOUS
  Administered 2015-09-26: 4 [IU] via SUBCUTANEOUS
  Administered 2015-09-27: 2 [IU] via SUBCUTANEOUS

## 2015-09-23 MED ORDER — ISOSORBIDE MONONITRATE ER 60 MG PO TB24
60.0000 mg | ORAL_TABLET | Freq: Every day | ORAL | Status: DC
  Administered 2015-09-23 – 2015-09-27 (×5): 60 mg via ORAL
  Filled 2015-09-23 (×5): qty 1

## 2015-09-23 MED ORDER — NEPRO/CARBSTEADY PO LIQD
237.0000 mL | Freq: Two times a day (BID) | ORAL | Status: DC
  Administered 2015-09-23 – 2015-09-26 (×6): 237 mL via ORAL
  Filled 2015-09-23 (×14): qty 237

## 2015-09-23 MED ORDER — INSULIN ASPART 100 UNIT/ML ~~LOC~~ SOLN
0.0000 [IU] | Freq: Three times a day (TID) | SUBCUTANEOUS | Status: DC
  Administered 2015-09-23: 12:00:00 2 [IU] via SUBCUTANEOUS
  Administered 2015-09-23: 5 [IU] via SUBCUTANEOUS
  Administered 2015-09-24 (×2): 2 [IU] via SUBCUTANEOUS
  Administered 2015-09-25 (×2): 5 [IU] via SUBCUTANEOUS
  Administered 2015-09-25: 07:00:00 2 [IU] via SUBCUTANEOUS
  Administered 2015-09-26: 3 [IU] via SUBCUTANEOUS
  Administered 2015-09-26: 7 [IU] via SUBCUTANEOUS
  Administered 2015-09-26 – 2015-09-27 (×3): 3 [IU] via SUBCUTANEOUS

## 2015-09-23 MED ORDER — METOPROLOL TARTRATE 100 MG PO TABS
100.0000 mg | ORAL_TABLET | Freq: Two times a day (BID) | ORAL | Status: DC
  Administered 2015-09-23 – 2015-09-27 (×9): 100 mg via ORAL
  Filled 2015-09-23: qty 1
  Filled 2015-09-23: qty 4
  Filled 2015-09-23: qty 1
  Filled 2015-09-23: qty 4
  Filled 2015-09-23: qty 1
  Filled 2015-09-23: qty 4
  Filled 2015-09-23 (×3): qty 1

## 2015-09-23 MED ORDER — INSULIN ASPART 100 UNIT/ML ~~LOC~~ SOLN
3.0000 [IU] | Freq: Three times a day (TID) | SUBCUTANEOUS | Status: DC
  Administered 2015-09-23 – 2015-09-24 (×3): 3 [IU] via SUBCUTANEOUS

## 2015-09-23 MED ORDER — HYDRALAZINE HCL 20 MG/ML IJ SOLN: 10.0000 mg | INTRAMUSCULAR | Status: DC | PRN

## 2015-09-23 NOTE — Care Management Note (Signed)
Case Management Note  Patient Details  Name: Russell [name] MRN: [id number] Date of Birth: 06-Mar-1970  Subjective/Objective:    S/P left heart cath, severe two vessel dz, hep drip, per MD note will start nitro drip also, and will consult CT surgery for CABG.  NCM will cont to follow for dc needs.                 Action/Plan:   Expected Discharge Date:                  Expected Discharge Plan:  Home/Self Care  In-House Referral:     Discharge planning Services  CM Consult  Post Acute Care Choice:    Choice offered to:     DME Arranged:    DME Agency:     HH Arranged:    HH Agency:     Status of Service:  In process, will continue to follow  If discussed at Long Length of Stay Meetings, dates discussed:    Additional Comments:  Leone Havenaylor, Juanmanuel Marohl Clinton, RN 09/23/2015, 9:09 AM

## 2015-09-23 NOTE — Progress Notes (Signed)
[facility] Progress Note   Subjective:  "I've got a 3 vessel open heart surgery coming". Went to cardiac cath yesterday. Has 3 vessel dz, Dr. Tyrone SageGerhardt consulted. For CABG Tuesday.  No C/O Chest pain, still has HA.  Mother at bedside. Pt appears calm and relaxed.   Objective Vitals:   09/23/15 0400 09/23/15 0500 09/23/15 0600 09/23/15 0745  BP: (!) 159/118 (!) 171/140 135/71 (!) 180/84  Pulse: 67 69 66 73  Resp: (!) 28 14 (!) 9 15  Temp:    97.9 F (36.6 C)  TempSrc:    Oral  SpO2: 95% 93% 97% 100%  Weight:      Height:       Physical Exam General: Awake, alert, NAD Heart: S1,S2, 1/6 systolic M.  Lungs: BBS CTA anteriorly Abdomen: obese, non-tender. Extremities: No LE edema Dialysis Access: RFA AVF + bruit  Dialysis Orders: [facility] MWF 4 hours 450/800 117 kg 2.0 K/2.5 Ca bath. UF profile 4 Heparin 8000 units IV at start of tx Heparin 3000 units IV mid run Mircera 75 mcg IV q 2 weeks (last dose 09/15/15 Has HGB 10.0 09/15/15) Venofer 50 mg IV weekly (last dose 09*131/17 Fe 75 Tsat 29 08/18/15) Calcitriol 0.25 mcg PO q treatment (last PTH 424 08/18/15)  Ca 8.7 C Ca 8.9  Phos 7.6 (08/18/15) Takes Phoslo 667 mg 3 capsules PO TID AC and 2 capsules with snacks.  Additional Objective Labs: Basic Metabolic Panel:  Recent Labs Lab 09/21/15 0623 09/22/15 0226 09/23/15 0301  NA 136 132* 136  K 3.8 3.9 3.6  CL 100* 100* 98*  CO2 24 21* 29  GLUCOSE 209* 336* 170*  BUN 48* 59* 31*  CREATININE 5.89* 6.75* 5.02*  CALCIUM 8.4* 8.5* 8.2*  PHOS  --   --  6.0*   Liver Function Tests:  Recent Labs Lab 09/20/15 1202 09/23/15 0301  AST 22  --   ALT 15*  --   ALKPHOS 100  --   BILITOT 0.5  --   PROT 6.5  --   ALBUMIN 3.1* 2.8*   CBC:  Recent Labs Lab 09/20/15 1202 09/21/15 0245 09/22/15 0226 09/23/15 0301  WBC 6.3 7.0 6.4 7.2  HGB 9.3* 9.4* 9.1* 9.5*  HCT 27.6* 28.8* 28.2* 29.7*  MCV 82.9 84.2 86.0 87.9  PLT 262 290 295 286    Cardiac Enzymes:  Recent Labs Lab 09/20/15 1542 09/20/15 1855 09/20/15 2201 09/21/15 0027  TROPONINI 1.71* 2.10* 1.83* 1.68*   CBG:  Recent Labs Lab 09/22/15 0353 09/22/15 1232 09/22/15 1642 09/22/15 2112 09/23/15 0617  GLUCAP 272* 127* 105* 291* 154*   Iron Studies: No results for input(s): IRON, TIBC, TRANSFERRIN, FERRITIN in the last 72 hours. @lablastinr3 @ Studies/Results: No results found. Medications: . sodium chloride 10 mL/hr at 09/23/15 0600  . heparin 1,650 Units/hr (09/23/15 0810)  . nitroGLYCERIN 20 mcg/min (09/23/15 0735)   . amLODipine  5 mg Oral Daily  . aspirin EC  81 mg Oral Once  . atorvastatin  80 mg Oral q1800  . calcitRIOL  0.25 mcg Oral Q M,W,F-HD  . calcium acetate  2,001 mg Oral TID WC  . citalopram  60 mg Oral QHS  . clonazePAM  2 mg Oral QHS  . darbepoetin (ARANESP) injection - DIALYSIS  40 mcg Intravenous Q Wed-HD  . gabapentin  100 mg Oral TID  . gi cocktail  30 mL Oral Once  . heparin  8,000 Units Dialysis Once in dialysis  . insulin aspart  0-15 Units Subcutaneous TID WC  . insulin aspart  0-5 Units Subcutaneous QHS  . insulin glargine  36 Units Subcutaneous QHS  . levothyroxine  50 mcg Oral QAC breakfast  . metoprolol tartrate  75 mg Oral BID  . omega-3 acid ethyl esters  2 g Oral BID  . sodium chloride flush  3 mL Intravenous Q12H     Assessment/Plan: 1.  Chest pain/Severe 2 vessel disease per cardiac cath 09/22/15. Ramus lesion, 90 %stenosedOst 1st Diag to 1st Diag lesion, 95 %stenosed, 95 %stenosed.Prox LAD lesion, 80 %stenosed. EF 55-65%. CVTS consulted. For CABG next week. Chest pain now controlled on NTG drip. Continue heparin drip.  2. ESRD -MWF [place]. For HD tomorrow. K+ 3.6. Use 4.0 K bath. No heparin since on heparin drip.  3. Anemia - HGB 9.5. Rec'd Aranesp 40 mcg IV 09/22/15. Follow HGB.  4. Secondary hyperparathyroidism - Ca 8.2 C Ca 9.4 Phos 6.0 Cont binders, VDRA.  5. HTN/volume - HD 09/22/15 Pre wt 121.2  kg  Post wt 118 kg left 1 kg above EDW. BP soft toward end of tx. Cont. Metoprolol/Amlodipine. NTG for chest pain. Wt today 124 kg!  UFG 3.5-4 liters tomorrow. Need to reinforce fld restrictions! 6. Nutrition - Albumin 2.8. PCM in the setting of obesity. Renal/Carb mod diet. Nepro/renal vit 7. DM: per primary.  8. Hypothyroidism: per primary 9. H/O Headaches: thought to be tension HA. Tramadol ordered.   Rita H. Brown NP-C 09/23/2015, 9:56 AM  [facility] [phone]  Pt seen, examined and agree w A/P as above.  [name] MD [employer] pager [phone]    cell [phone] 09/23/2015, 12:46 PM

## 2015-09-23 NOTE — Progress Notes (Signed)
ANTICOAGULATION CONSULT NOTE  Pharmacy Consult:  Heparin Indication: chest pain/ACS  Allergies  Allergen Reactions  . Ibuprofen Other (See Comments)    MD told him not to take due to kidney disease.  . Nsaids Other (See Comments)    Told to avoid all nsaids due to kidney disease-takes acetaminophen as needed    Patient Measurements: Height: 6\' 2"  (188 cm) Weight: 273 lb 4.8 oz (124 kg) IBW/kg (Calculated) : 82.2 Heparin Dosing Weight: 108 kg   Vital Signs: Temp: 97.9 F (36.6 C) (09/21 0745) Temp Source: Oral (09/21 0745) BP: 180/84 (09/21 0745) Pulse Rate: 73 (09/21 0745)  Labs:  Recent Labs  09/20/15 1542  09/20/15 1855 09/20/15 2201  09/21/15 0027 09/21/15 0245 09/21/15 [id number] 09/21/15 1006 09/22/15 0226 09/22/15 0620 09/23/15 0301 09/23/15 0654  HGB  --   --   --   --   --   --  9.4*  --   --  9.1*  --  9.5*  --   HCT  --   --   --   --   --   --  28.8*  --   --  28.2*  --  29.7*  --   PLT  --   --   --   --   --   --  290  --   --  295  --  286  --   APTT 28  --   --   --   --   --   --   --   --   --   --   --   --   LABPROT  --   < >  --   --   --   --  14.2  --   --  12.4  --  14.0  --   INR  --   < >  --   --   --   --  1.09  --   --  0.93  --  1.08  --   HEPARINUNFRC  --   --   --   --   < > 0.29*  --   --  0.50  --  0.38  --  0.20*  CREATININE  --   --  5.26* 5.39*  --   --  5.67* 5.89*  --  6.75*  --  5.02*  --   TROPONINI 1.71*  --  2.10* 1.83*  --  1.68*  --   --   --   --   --   --   --   < > = values in this interval not displayed.  Estimated Creatinine Clearance: 26.3 mL/min (by C-G formula based on SCr of 5.02 mg/dL (H)).  Assessment: 45 y.o. male with chest pain to continue on IV heparin post cardiac catheterization.  Plans noted for CABG -heparin level= 0.2 on 1400 units/hr  -heparin was at goal on 1650 units/hr  Goal of Therapy:  Heparin level 0.3-0.7 units/ml Monitor platelets by anticoagulation protocol: Yes   Plan:  - Increase  heparin to 1650 units/hr -heparin level in 6 hrs - Daily heparin level and CBC - Follow plans for CABG timing  Harland Germanndrew Arrion Burruel, Ilda Bassetharm D 09/23/2015 8:03 AM

## 2015-09-23 NOTE — Progress Notes (Signed)
Patient ID: Russell WACHTER, male   DOB: 1970/02/26, 45 y.o.   MRN: [id number]      [address]       [place] [address]             [phone]                 1 Day Post-Op Procedure(s) (LRB): Left Heart Cath and Coronary Angiography (N/A)  LOS: 3 days   Subjective: No chest pain   Objective: Vital signs in last 24 hours: Patient Vitals for the past 24 hrs:  BP Temp Temp src Pulse Resp SpO2 Weight  09/23/15 1546 117/65 98 F (36.7 C) Oral 76 15 100 % -  09/23/15 1130 (!) 159/82 97.9 F (36.6 C) Oral 68 17 98 % -  09/23/15 0745 (!) 180/84 97.9 F (36.6 C) Oral 73 15 100 % -  09/23/15 0600 135/71 - - 66 (!) 9 97 % -  09/23/15 0500 (!) 171/140 - - 69 14 93 % -  09/23/15 0400 (!) 159/118 - - 67 (!) 28 95 % -  09/23/15 0316 (!) 149/67 98.1 F (36.7 C) Oral 70 18 97 % 273 lb 4.8 oz (124 kg)  09/22/15 2000 (!) 167/83 - - 89 (!) 22 95 % -  09/22/15 1920 - - - 79 16 97 % -  09/22/15 1900 (!) 170/86 98 F (36.7 C) Oral 81 18 99 % -    Filed Weights   09/22/15 0742 09/22/15 1142 09/23/15 0316  Weight: 267 lb 3.2 oz (121.2 kg) 260 lb 2.3 oz (118 kg) 273 lb 4.8 oz (124 kg)    Hemodynamic parameters for last 24 hours:    Intake/Output from previous day: 09/20 0701 - 09/21 0700 In: 438.1 [P.O.:240; I.V.:198.1] Out: 300 [Urine:300] Intake/Output this shift: Total I/O In: 480 [P.O.:480] Out: -   Scheduled Meds: . amLODipine  5 mg Oral Daily  . aspirin EC  81 mg Oral Once  . atorvastatin  80 mg Oral q1800  . calcitRIOL  0.25 mcg Oral Q M,W,F-HD  . calcium acetate  2,001 mg Oral TID WC  . citalopram  60 mg Oral QHS  . clonazePAM  2 mg Oral QHS  . darbepoetin (ARANESP) injection - DIALYSIS  40 mcg Intravenous Q Wed-HD  . feeding supplement (NEPRO CARB STEADY)  237 mL Oral BID BM  . gabapentin  100 mg Oral TID  . gi cocktail  30 mL Oral Once  . heparin  8,000 Units Dialysis Once in dialysis  . insulin aspart  0-5 Units Subcutaneous QHS  . insulin  aspart  0-9 Units Subcutaneous TID WC  . insulin aspart  3 Units Subcutaneous TID WC  . insulin glargine  36 Units Subcutaneous QHS  . isosorbide mononitrate  60 mg Oral Daily  . levothyroxine  50 mcg Oral QAC breakfast  . metoprolol tartrate  100 mg Oral BID  . multivitamin  1 tablet Oral QHS  . omega-3 acid ethyl esters  2 g Oral BID  . sodium chloride flush  3 mL Intravenous Q12H   Continuous Infusions: . sodium chloride 10 mL/hr at 09/23/15 0600  . heparin 1,650 Units/hr (09/23/15 1703)   PRN Meds:.sodium chloride, sodium chloride, sodium chloride, acetaminophen, alteplase, gi cocktail, heparin, hydrALAZINE, lidocaine (PF), lidocaine-prilocaine, metoCLOPramide, MUSCLE RUB, pentafluoroprop-tetrafluoroeth, sodium chloride flush, traMADol  General appearance: alert, cooperative and no distress Neurologic: intact Heart: regular rate and rhythm, S1, S2 normal, no murmur, click, rub or  gallop Lungs: clear to auscultation bilaterally Abdomen: soft, non-tender; bowel sounds normal; no masses,  no organomegaly Extremities: edema pedal edema  Lab Results: CBC: Recent Labs  09/22/15 0226 09/23/15 0301  WBC 6.4 7.2  HGB 9.1* 9.5*  HCT 28.2* 29.7*  PLT 295 286   BMET:  Recent Labs  09/22/15 0226 09/23/15 0301  NA 132* 136  K 3.9 3.6  CL 100* 98*  CO2 21* 29  GLUCOSE 336* 170*  BUN 59* 31*  CREATININE 6.75* 5.02*  CALCIUM 8.5* 8.2*    PT/INR:  Recent Labs  09/23/15 0301  LABPROT 14.0  INR 1.08     Radiology No results found.   Assessment/Plan: S/P Procedure(s) (LRB): Left Heart Cath and Coronary Angiography (N/A) Mobilize Diabetes control Plan Dialysis Friday , Sunday and Monday, CABG Tuesday   Delight OvensEdward B Donterius Filley MD 09/23/2015 6:36 PM

## 2015-09-23 NOTE — Progress Notes (Signed)
Patient Name: Russell [name] Date of Encounter: 09/23/2015  Active Problems:   DKA (diabetic ketoacidoses) (HCC)   NSTEMI (non-ST elevated myocardial infarction) (HCC)   Hyperglycemia due to type 2 diabetes mellitus [facility](HCC)   Primary Cardiologist: Dr Jens Somrenshaw Patient Profile: 45 yo male w/ hx IDDM, ESRD on HD, HTN, GERD, anemia, depression, hypothyroid, neuropathy. Admitted 09/18 w/ DKA and chest pain/NSTEMI. S/p cath w/ 2 v dz, TCTS has seen, CABG next week if stable.   SUBJECTIVE: No more chest pain, no SOB. Breathing ok. Gets HA when the nitro is increased to control his BP.  OBJECTIVE Vitals:   09/23/15 0500 09/23/15 0600 09/23/15 0745 09/23/15 1130  BP: (!) 171/140 135/71 (!) 180/84 (!) 159/82  Pulse: 69 66 73 68  Resp: 14 (!) 9 15 17   Temp:   97.9 F (36.6 C) 97.9 F (36.6 C)  TempSrc:   Oral Oral  SpO2: 93% 97% 100% 98%  Weight:      Height:        Intake/Output Summary (Last 24 hours) at 09/23/15 1422 Last data filed at 09/23/15 1348  Gross per 24 hour  Intake           918.13 ml  Output              300 ml  Net           618.13 ml   Filed Weights   09/22/15 0742 09/22/15 1142 09/23/15 0316  Weight: 267 lb 3.2 oz (121.2 kg) 260 lb 2.3 oz (118 kg) 273 lb 4.8 oz (124 kg)    PHYSICAL EXAM General: Well developed, well nourished, male in no acute distress. Head: Normocephalic, atraumatic.  Neck: Supple without bruits, JVD not well seen. Lungs:  Resp regular and unlabored, CTA. Heart: RRR, S1, S2, no S3, S4, or murmur; no rub. Abdomen: Soft, non-tender, non-distended, BS + x 4.  Extremities: No clubbing, cyanosis, edema. R radial cath site without ecchymosis or hematoma Neuro: Alert and oriented X 3. Moves all extremities spontaneously. Psych: Normal affect.  LABS: CBC: Recent Labs  09/22/15 0226 09/23/15 0301  WBC 6.4 7.2  HGB 9.1* 9.5*  HCT 28.2* 29.7*  MCV 86.0 87.9  PLT 295 286   INR: Recent Labs  09/23/15 0301  INR 1.08   Basic  Metabolic Panel: Recent Labs  09/22/15 0226 09/23/15 0301  NA 132* 136  K 3.9 3.6  CL 100* 98*  CO2 21* 29  GLUCOSE 336* 170*  BUN 59* 31*  CREATININE 6.75* 5.02*  CALCIUM 8.5* 8.2*  PHOS  --  6.0*   Liver Function Tests: Recent Labs  09/23/15 0301  ALBUMIN 2.8*   Cardiac Enzymes: Recent Labs  09/20/15 1855 09/20/15 2201 09/21/15 0027  TROPONINI 2.10* 1.83* 1.68*   Hemoglobin A1C: Lab Results  Component Value Date   HGBA1C 16.8 (H) 07/20/2015   Fasting Lipid Panel: Recent Labs  09/21/15 0245  CHOL 203*  HDL 33*  LDLCALC 111*  TRIG 294*  CHOLHDL 6.2    TELE:   SR, ST     Current Medications:  . amLODipine  5 mg Oral Daily  . aspirin EC  81 mg Oral Once  . atorvastatin  80 mg Oral q1800  . calcitRIOL  0.25 mcg Oral Q M,W,F-HD  . calcium acetate  2,001 mg Oral TID WC  . citalopram  60 mg Oral QHS  . clonazePAM  2 mg Oral QHS  . darbepoetin (ARANESP) injection - DIALYSIS  40 mcg Intravenous Q Wed-HD  . feeding supplement (NEPRO CARB STEADY)  237 mL Oral BID BM  . gabapentin  100 mg Oral TID  . gi cocktail  30 mL Oral Once  . heparin  8,000 Units Dialysis Once in dialysis  . insulin aspart  0-5 Units Subcutaneous QHS  . insulin aspart  0-9 Units Subcutaneous TID WC  . insulin aspart  3 Units Subcutaneous TID WC  . insulin glargine  36 Units Subcutaneous QHS  . levothyroxine  50 mcg Oral QAC breakfast  . metoprolol tartrate  100 mg Oral BID  . multivitamin  1 tablet Oral QHS  . omega-3 acid ethyl esters  2 g Oral BID  . sodium chloride flush  3 mL Intravenous Q12H   . sodium chloride 10 mL/hr at 09/23/15 0600  . heparin 1,650 Units/hr (09/23/15 0810)  . nitroGLYCERIN 10 mcg/min (09/23/15 1151)    ASSESSMENT AND PLAN: 1. NSTEMI: Peak troponin 2.10. Echo with moderate LVH with severe hypertrophy of the posterior wall. EF 60% to 65% - cardiac cath w/ severe 2v CAD, TCTS has seen, for CABG next week - continue heparin, nitrates, ASA, BB and  high-dose statin.  2. HTN: improved today with resumption of amlodipine 5 mg and metoprolol increased to 100 mg bid.  With dialysis decreased to 108/66.  - will add prn hydralazine for BP, since higher dose IV NTG gives him headaches  3. Uncontrolled ZO:XWRUEA anion gap; treated with IV insulin now on subcu,  followed by medicine service.   4. Hyperlipidemia with an atherogenic panel with increased TG, low HDL increased VLDL;  - suspect significantly increased LDL particles despite LDL only 111;  - with DM needs aggressive intervention; on atorvastatin 80 - could add lovaza or vascepa 2 capsule bid. Consider fenofibrate Rx.   5. ESRD on dialysis; meds held prior to dialysis to avoid hypotension, per IM/Nephrology  6. Anemia of CKD today 9.5, per IM   7. Hypothyroidismon synthroid replacement. Per IM  Active Problems:   DKA (diabetic ketoacidoses) (HCC)   NSTEMI (non-ST elevated myocardial infarction) (HCC)   Hyperglycemia due to type 2 diabetes mellitus (HCC)   Signed, Barrett, Rhonda , PA-C 2:22 PM 09/23/2015  Patient seen and examined. Agree with assessment and plan. I have reviewed cines. Although lesions can be treated percutaneously agree with plan for CABG for optimal therapy in this diabetic with ESRD and h/o prior clot on xarelto. Continue heparin until surgery. Will change iv NTG currently only on 10 ug to Imdur 60 mg daily. Tentative plan for CABG on Tuesday.   Lennette Bihari, MD, [facility] 09/23/2015 2:48 PM

## 2015-09-23 NOTE — Progress Notes (Signed)
Family Medicine Teaching Service Daily Progress Note Intern Pager: [phone]  Patient name: Russell [name] Medical record number: [id number] Date of birth: 1970-11-09 Age: 45 y.o. Gender: male  Primary Care Provider: [name], MD Consultants: Cardiology Code Status: Full  Pt Overview and Major Events to Date:  Admitted 9/18 for hyperglycemia and chest pain. 9/18: transitioned off insulin gtt, now on SQ insulin 9/20: Cath done- severe 2 vessel disease, started on nitro drip for chest pain and HTN  Assessment and Plan: Russell [name] is a 45 y.o. male presenting with hyperglycemia, chest pain.Marland Kitchen PMH is significant for ESRD on HD MWF, insulin dependent T2DM, HTN, anemia of chronic disease, peripheral neuropathy, hypothyroidism, depression, GERD.   # Chest Pain, unstable angina: Troponin 1.71 > 2.1 > 1.83 > 1.68. Echo this hospitalization with normal Ef, moderate LVH w/ severe hypertrophy posterior wall, no valve abnormality. ASCVD score 9.9%. Cath showed severe two-vessel coronary arte involving a bifurcation proximal LAD/diagonal 1 and proximal large ramus. - cardiology consult - CVTS following.  - on heparin drip - CABG possibly Tuesday - would like him to be more euvolemic prior to procedure.  - now on heparin gtt.   # HHS, hyperglycemia, improved: Labs consistent with HHS. Possibly chest pain/MI (as above), no obvious infection, possible issue with current regimen vs compliance.  - BMP daily - Mealtime SSI - Start mealtime novolog 3 units, continue lantus 36 - reglan prn nausea -CBGs q4h with meals and qHS   # Hypertension: hx of essential hypertension. Was on amlodipine, clonidine, metoprolol, but now on them prn. Titrating up BP meds as tolerated. UDS neg. BPs elevated during/after cath. Stated on nitro gtt per cardiology.  - per cardiology, amlodipine 5mg , metoprolol tartrate 75 mg BID, but now on nitro drip. - if BPs remain elevated (150s/90s at times) consider  titrating up anti-hypertensives    # T2DM: insulin dependent, uncontrolled. Last A1c 16.8 on 07/20/15. Home regimen humalog 5:1 per his report, however notes he's doing 15-30 units TID with meals and lantus 60 units BID. Switched from insulin drip to [facility] - management as above.   # Headache improved significantly: localized to occipital region. Not sensitive to sound, no vision changes. Improves with occipital palpation. At his time, not very suspicious for intracranial process, symptoms most consistent with tension type headache/MSK. - tylenol prn pain - muscle cream for back of neck  # ESRD: MWF dialysis since 12/2014 after developing nephrotic syndrome, followed by Dr. [name]. H/o horseshoe kidney. -continue home calcium acetate - nephrology for HD management. - consider increase in phosphorus binders per nephrology  # Hyperlipidemia: Total cholest 203. Not currently on statin. ASCVD risk 9%, high intensity statin recommended. Pt has history of irritability while on different statins in past. Pt notes they "make him ill" and  he gets very angry with them. He notes taking simvastatin and 2 others but cannot recall which ones.  -  started lipitor 80mg  daily -on lovaza  # Hypothyroidism: stable, last TSH 1.2 on 07/20/15 -continue home synthroid  # Depression: stable -continue home celexa, clonopin - continue to monitor QTc in setting of celexa use.   # Anemia of chronic kidney disease: Hgb 9.3 on admission. Baseline Hgb 11.  -monitor CBC - pt receives EPO with HD.   # Peripheral neuropathy: secondary to T2DM -restarting gabapentin to appropriate renal dose, 100 TID - not continuing home pain meds for now- has 1 time dose of PRN Dilaudid Iv. - tramadol prn pain started by  cardiology.   FEN/GI: carb modified/renal diet Prophylaxis: heparin drip  Disposition: Home  Subjective:  Russell [name] feels better today. Headache and chest pain is greatly improved. Reports abdominal  pain has resolved. No new complaints. Normal urination and bowel movements. He believes his glucose have been high at night because he has not had an evening snack.  Objective: Temp:  [97.7 F (36.5 C)-98.1 F (36.7 C)] 98.1 F (36.7 C) (09/21 0316) Pulse Rate:  [66-89] 66 (09/21 0600) Resp:  [9-28] 9 (09/21 0600) BP: (93-207)/(64-140) 135/71 (09/21 0600) SpO2:  [0 %-100 %] 97 % (09/21 0600) Weight:  [118 kg (260 lb 2.3 oz)-124 kg (273 lb 4.8 oz)] 124 kg (273 lb 4.8 oz) (09/21 0316) Physical Exam: General: Obese male lying in bed in NAD Cardiovascular: RRR, no murmur  Respiratory: CTAB, no increased work of breathing Abdomen: soft, not tender, bowel sounds present. No rebound or guarding.  Skin: Warm, dry, no peripheral edema. Small healing wound on medial R great toe.  Laboratory:  Recent Labs Lab 09/21/15 0245 09/22/15 0226 09/23/15 0301  WBC 7.0 6.4 7.2  HGB 9.4* 9.1* 9.5*  HCT 28.8* 28.2* 29.7*  PLT 290 295 286    Recent Labs Lab 09/20/15 1202  09/21/15 0623 09/22/15 0226 09/23/15 0301  NA 129*  < > 136 132* 136  K 4.5  < > 3.8 3.9 3.6  CL 94*  < > 100* 100* 98*  CO2 24  < > 24 21* 29  BUN 40*  < > 48* 59* 31*  CREATININE 4.89*  < > 5.89* 6.75* 5.02*  CALCIUM 8.2*  < > 8.4* 8.5* 8.2*  PROT 6.5  --   --   --   --   BILITOT 0.5  --   --   --   --   ALKPHOS 100  --   --   --   --   ALT 15*  --   --   --   --   AST 22  --   --   --   --   GLUCOSE 754*  < > 209* 336* 170*  < > = values in this interval not displayed.  Risk Stratification Labs  TSH    Component Value Date/Time   TSH 1.243 07/20/2015 0427   TSH 3.492 04/10/2014 1054   Hemoglobin A1C    Component Value Date/Time   HGBA1C 16.8 (H) 07/20/2015 0427   Lipid Panel     Component Value Date/Time   CHOL 203 (H) 09/21/2015 0245   TRIG 294 (H) 09/21/2015 0245   HDL 33 (L) 09/21/2015 0245   CHOLHDL 6.2 09/21/2015 0245   VLDL 59 (H) 09/21/2015 0245   LDLCALC 111 (H) 09/21/2015 0245    ABG: 7.4 / 39.5 / 76 / 24.7 / 95%  Osmolality: 304 Pt: 14.7 INR: 1.09 UDS: negative  UA: nitrites, ketones, leuks neg. Granular casts, mod Hgb, 0-5 RBCs, protein >300  Imaging/Diagnostic Tests:  ECHO 09/21/2015 ------------------------------------------------------------------- Study Conclusions  - Left ventricle: The cavity size was normal. Septal wall thickness was increased in a pattern of moderate LVH with severe hypertrophy of the posterior wall. Systolic function was normal. The estimated ejection fraction was in the range of 60% to 65%.   Wall motion was normal; there were no regional wall motion abnormalities. Doppler parameters are consistent with abnormal left ventricular relaxation (grade 1 diastolic dysfunction). Doppler parameters are consistent with indeterminate ventricular filling pressure. - Aortic valve: Transvalvular velocity was within the  normal range.There was no stenosis. There was no regurgitation. - Mitral valve: Transvalvular velocity was within the normal range.There was no evidence for stenosis. There was trivial regurgitation. - Right ventricle: The cavity size was normal. Wall thickness was normal. Systolic function was normal. - Tricuspid valve: There was no regurgitation. - Pulmonic valve: There was no regurgitation.  CATH 09/23/15  Ramus lesion, 90 %stenosed.  Ost 1st Diag to 1st Diag lesion, 95 %stenosed.  Prox LAD lesion, 80 %stenosed.  The left ventricular systolic function is normal.  LV end diastolic pressure is mildly elevated.  The left ventricular ejection fraction is 55-65% by visual estimate.  1. Severe two-vessel coronary arte involving a bifurcation proximal LAD/diagonal 1 and proximal large ramus. 2. Normal LV systolic function with mildly elevated LVEDP.   Ernestina Columbia, Medical Student 09/23/2015, 7:10 AM [facility] FPTS Intern pager: [phone], text pages welcome  RESIDENT ADDENDUM  I have separately  seen and examined the patient. I have discussed the findings and exam with the medical student and agree with the above note, which I have edited appropriately. I helped develop the management plan that is described in the student's note, and I agree with the content.  Additionally I have outlined my exam and assessment/plan below:   Chest pain and headache still without improvement. 2 episodes of emesis.   PE:  Blood pressure 105/64, pulse 76, temperature 97.8 F (36.6 C), temperature source Oral, resp. rate 16, height 6\' 2"  (1.88 m), weight 267 lb 3.2 oz (121.2 kg), SpO2 100 %. General: Lying in bed on HD, non-toxic.  Eyes: Conjunctivae non-injected.  ENTM: Moist mucous membranes. Oropharynx clear. No nasal discharge.  Neck: Supple, no LAD Cardiovascular: RRR. No murmurs, rubs, or gallops noted. Trace pitting edema noted. Respiratory: No increased WOB. CTAB without wheezing, rhonchi, or crackles noted. Abdomen: +BS, soft, non-distended, non-tender.  A/P:  45 y/o male presenting with hyperglycemia >700 consistent for HHS. Also noted to have chest pain s/p cath on 9/20 showing severe 2 vessel disease.   CAD: severe 2 vessel disease. Patient notes CVTS is planning to do a triple bypass on Tuesday after his volume status is more improved.  - cards and CVTS following - on heparin gtt - started on nitro gtt for HTN and chest pain.    DM/HSS: HSS resolved.  - received Lantus 36 units last night. CBGs 105-291 (1 CBG in the 200s in the last 24hrs).  - concerns for compliance given low levels of Lantus now compared to his home dose.  - will add Novolog 3 units TID with meals - will decrease from moderate to sensitive SSI.   HTN: significantly elevated during/after cath. Improved with nitro gtt. - continue to monitor  - could consider titrating up amlodipine or metoprolol, will defer to cards as they started nitro gtt.   ESRD - renal following, HD Friday - phosphorus up will leave to  renal on whether to increase phosphorus binder   Joanna Puff, MD [facility] Family Medicine Resident  09/23/2015, 7:10 AM

## 2015-09-23 NOTE — Progress Notes (Signed)
ANTICOAGULATION CONSULT NOTE  Pharmacy Consult:  Heparin Indication: chest pain/ACS  Allergies  Allergen Reactions  . Ibuprofen Other (See Comments)    MD told him not to take due to kidney disease.  . Nsaids Other (See Comments)    Told to avoid all nsaids due to kidney disease-takes acetaminophen as needed    Patient Measurements: Height: 6\' 2"  (188 cm) Weight: 273 lb 4.8 oz (124 kg) IBW/kg (Calculated) : 82.2 Heparin Dosing Weight: 108 kg   Vital Signs: Temp: 98 F (36.7 C) (09/21 1546) Temp Source: Oral (09/21 1546) BP: 117/65 (09/21 1546) Pulse Rate: 76 (09/21 1546)  Labs:  Recent Labs  09/20/15 1855 09/20/15 2201 09/21/15 0027  09/21/15 0245 09/21/15 [id number]  09/22/15 0226 09/22/15 0620 09/23/15 0301 09/23/15 0654 09/23/15 1619  HGB  --   --   --   < > 9.4*  --   --  9.1*  --  9.5*  --   --   HCT  --   --   --   --  28.8*  --   --  28.2*  --  29.7*  --   --   PLT  --   --   --   --  290  --   --  295  --  286  --   --   LABPROT  --   --   --   --  14.2  --   --  12.4  --  14.0  --   --   INR  --   --   --   --  1.09  --   --  0.93  --  1.08  --   --   HEPARINUNFRC  --   --  0.29*  --   --   --   < >  --  0.38  --  0.20* 0.37  CREATININE 5.26* 5.39*  --   --  5.67* 5.89*  --  6.75*  --  5.02*  --   --   TROPONINI 2.10* 1.83* 1.68*  --   --   --   --   --   --   --   --   --   < > = values in this interval not displayed.  Estimated Creatinine Clearance: 26.3 mL/min (by C-G formula based on SCr of 5.02 mg/dL (H)).  Assessment: 45 y.o. male with chest pain to continue on IV heparin post cardiac catheterization.  Plans for CABG 9/26 Heparin level = 0.37, therapeutic on 1650 units/hr  Goal of Therapy:  Heparin level 0.3-0.7 units/ml Monitor platelets by anticoagulation protocol: Yes   Plan:  - Continue heparin 1650 units/hr - Daily heparin level and CBC  Bayard HuggerMei Jayceion Lisenby, PharmD, BCPS  Clinical Pharmacist  Pager: [phone]    09/23/2015 5:04 PM

## 2015-09-23 NOTE — Care Management Important Message (Signed)
Important Message  Patient Details  Name: Fonnie BirkenheadSteven B Montella MRN: [id number] Date of Birth: 05/20/1970   Medicare Important Message Given:  Yes    Stayce Delancy 09/23/2015, 11:15 AM

## 2015-09-24 ENCOUNTER — Other Ambulatory Visit (HOSPITAL_COMMUNITY): Payer: Managed Care, Other (non HMO)

## 2015-09-24 DIAGNOSIS — I25118 Atherosclerotic heart disease of native coronary artery with other forms of angina pectoris: Secondary | ICD-10-CM

## 2015-09-24 DIAGNOSIS — I10 Essential (primary) hypertension: Secondary | ICD-10-CM

## 2015-09-24 LAB — RENAL FUNCTION PANEL
ANION GAP: 12 (ref 5–15)
Albumin: 2.8 g/dL — ABNORMAL LOW (ref 3.5–5.0)
BUN: 49 mg/dL — ABNORMAL HIGH (ref 6–20)
CHLORIDE: 97 mmol/L — AB (ref 101–111)
CO2: 26 mmol/L (ref 22–32)
Calcium: 8.5 mg/dL — ABNORMAL LOW (ref 8.9–10.3)
Creatinine, Ser: 6.96 mg/dL — ABNORMAL HIGH (ref 0.61–1.24)
GFR, EST AFRICAN AMERICAN: 10 mL/min — AB (ref >60)
GFR, EST NON AFRICAN AMERICAN: 9 mL/min — AB (ref >60)
Glucose, Bld: 173 mg/dL — ABNORMAL HIGH (ref 65–99)
POTASSIUM: 3.9 mmol/L (ref 3.5–5.1)
Phosphorus: 7.6 mg/dL — ABNORMAL HIGH (ref 2.5–4.6)
Sodium: 135 mmol/L (ref 135–145)

## 2015-09-24 LAB — CBC
HEMATOCRIT: 28.1 % — AB (ref 39.0–52.0)
HEMOGLOBIN: 8.9 g/dL — AB (ref 13.0–17.0)
MCH: 27.6 pg (ref 26.0–34.0)
MCHC: 31.7 g/dL (ref 30.0–36.0)
MCV: 87 fL (ref 78.0–100.0)
Platelets: 271 10*3/uL (ref 150–400)
RBC: 3.23 MIL/uL — AB (ref 4.22–5.81)
RDW: 16.2 % — ABNORMAL HIGH (ref 11.5–15.5)
WBC: 7.2 10*3/uL (ref 4.0–10.5)

## 2015-09-24 LAB — GLUCOSE, CAPILLARY
GLUCOSE-CAPILLARY: 169 mg/dL — AB (ref 65–99)
Glucose-Capillary: 88 mg/dL (ref 65–99)
Glucose-Capillary: 184 mg/dL — ABNORMAL HIGH (ref 65–99)
Glucose-Capillary: 226 mg/dL — ABNORMAL HIGH (ref 65–99)

## 2015-09-24 LAB — PROTIME-INR
INR: 1.04
Prothrombin Time: 13.7 seconds (ref 11.4–15.2)

## 2015-09-24 LAB — HEPARIN LEVEL (UNFRACTIONATED): HEPARIN UNFRACTIONATED: 0.38 [IU]/mL (ref 0.30–0.70)

## 2015-09-24 MED ORDER — INSULIN GLARGINE 100 UNIT/ML ~~LOC~~ SOLN
20.0000 [IU] | Freq: Two times a day (BID) | SUBCUTANEOUS | Status: DC
  Administered 2015-09-24 – 2015-09-26 (×4): 20 [IU] via SUBCUTANEOUS
  Filled 2015-09-24 (×6): qty 0.2

## 2015-09-24 MED ORDER — DARBEPOETIN ALFA 100 MCG/0.5ML IJ SOSY
100.0000 ug | PREFILLED_SYRINGE | INTRAMUSCULAR | Status: DC
  Administered 2015-09-29: 100 ug via INTRAVENOUS
  Filled 2015-09-24 (×2): qty 0.5

## 2015-09-24 MED ORDER — TRAMADOL HCL 50 MG PO TABS
50.0000 mg | ORAL_TABLET | Freq: Three times a day (TID) | ORAL | Status: DC | PRN
  Administered 2015-09-24: 50 mg via ORAL
  Filled 2015-09-24: qty 1

## 2015-09-24 MED ORDER — TRAMADOL HCL 50 MG PO TABS
ORAL_TABLET | ORAL | Status: AC
  Filled 2015-09-24: qty 1

## 2015-09-24 MED ORDER — SODIUM CHLORIDE 0.9 % IV SOLN: 100.0000 mL | INTRAVENOUS | Status: DC | PRN

## 2015-09-24 MED ORDER — FLUTICASONE PROPIONATE 50 MCG/ACT NA SUSP
1.0000 | Freq: Every day | NASAL | Status: DC
  Filled 2015-09-24: qty 16

## 2015-09-24 NOTE — Progress Notes (Signed)
CARDIAC REHAB PHASE I   PRE:  Rate/Rhythm: 79 SR  BP:  Sitting: 128/62        SaO2: 99 RA  MODE:  Ambulation: 500 ft   POST:  Rate/Rhythm: 86 SR  BP:  Sitting: 141/68         SaO2: 100 RA  Pt ambulated 500 ft on RA, IV, assist x1, fairly steady gait, tolerated fairly well with some c/o chronic leg pain/neuropathy, denies any other complaints. Cardiac surgery pre-op education completed with pt. Reviewed IS, sternal precautions, activity progression, cardiac surgery booklet and cardiac surgery guidelines. Pt verbalized understanding. Pt to recliner after walk, call bell within reach. Will follow.   [id number] Russell GrapesEmily C Faustino Luecke, RN, BSN 09/24/2015 3:31 PM

## 2015-09-24 NOTE — Progress Notes (Signed)
ANTICOAGULATION CONSULT NOTE  Pharmacy Consult:  Heparin Indication: chest pain/ACS  Allergies  Allergen Reactions  . Ibuprofen Other (See Comments)    MD told him not to take due to kidney disease.  . Nsaids Other (See Comments)    Told to avoid all nsaids due to kidney disease-takes acetaminophen as needed    Patient Measurements: Height: 6\' 2"  (188 cm) Weight: 274 lb 8 oz (124.5 kg) IBW/kg (Calculated) : 82.2 Heparin Dosing Weight: 108 kg   Vital Signs: Temp: 97.6 F (36.4 C) (09/22 0421) Temp Source: Oral (09/22 0421) BP: 128/67 (09/22 0421) Pulse Rate: 69 (09/22 0421)  Labs:  Recent Labs  09/22/15 0226  09/23/15 0301 09/23/15 0654 09/23/15 1619 09/24/15 0351 09/24/15 0352  HGB 9.1*  --  9.5*  --   --  8.9*  --   HCT 28.2*  --  29.7*  --   --  28.1*  --   PLT 295  --  286  --   --  271  --   LABPROT 12.4  --  14.0  --   --  13.7  --   INR 0.93  --  1.08  --   --  1.04  --   HEPARINUNFRC  --   < >  --  0.20* 0.37  --  0.38  CREATININE 6.75*  --  5.02*  --   --  6.96*  --   < > = values in this interval not displayed.  Estimated Creatinine Clearance: 19 mL/min (by C-G formula based on SCr of 6.96 mg/dL (H)).  Assessment: 45 y.o. male with chest pain to continue on IV heparin post cardiac catheterization.  Plans for CABG 9/26.  Patient's heparin level was therapeutic this morning at 0.38 units/mL on 1650 units/hr. Hemoglobin is low/stable, platelet count within normal limits, and no bleeding noted.   Goal of Therapy:  Heparin level 0.3-0.7 units/ml Monitor platelets by anticoagulation protocol: Yes   Plan:  - Continue heparin 1650 units/hr - Daily heparin level and CBC - Monitor for signs/symptoms of bleeding  Carylon PerchesMaggie Adelfo Diebel, PharmD Acute Care Pharmacy Resident  Pager: [phone] 09/24/2015

## 2015-09-24 NOTE — Progress Notes (Addendum)
Family Medicine Teaching Service Daily Progress Note Intern Pager: [phone]  Patient name: Russell [name] Medical record number: [id number] Date of birth: November 24, 1970 Age: 45 y.o. Gender: male  Primary Care Provider: [name], MD Consultants: Cardiology Code Status: Full  Pt Overview and Major Events to Date:  Admitted 9/18 for hyperglycemia and chest pain. 9/18: transitioned off insulin gtt, now on SQ insulin 9/20: Cath done- severe 2 vessel disease, started on nitro drip for chest pain and HTN 9/21: off nitro drip. Added imdur 60, metoprolol increased to 100 BID  Assessment and Plan: Russell [name] is a 45 y.o. male presenting with hyperglycemia, chest pain.Marland Kitchen PMH is significant for ESRD on HD MWF, insulin dependent T2DM, HTN, anemia of chronic disease, peripheral neuropathy, hypothyroidism, depression, GERD.   # Chest Pain, unstable angina: Troponin 1.71 > 2.1 > 1.83 > 1.68. Echo this hospitalization with normal Ef, moderate LVH w/ severe hypertrophy posterior wall, no valve abnormality. ASCVD score 9.9%. Cath showed severe two-vessel coronary arte involving a bifurcation proximal LAD/diagonal 1 and proximal large ramus. - cardiology consult - CVTS following.  - CABG possibly Tuesday, 8/26  - would like him to be more euvolemic prior to procedure.  - now on heparin gtt.   # HHS, hyperglycemia, improved: Labs consistent with HHS. Possibly chest pain/MI (as above), no obvious infection, possible issue with current regimen vs compliance. See problem below for further information.  - BMP daily - Mealtime SSI - Mealtime novolog 3 units, continue lantus 36 -CBGs q4h with meals and qHS   # Hypertension: hx of essential hypertension. Was on amlodipine, clonidine, metoprolol, but now on them prn. Titrating up BP meds as tolerated. UDS neg. BPs elevated during/after cath. Off nitro gtt.  - amlodipine 5mg , metoprolol tartrate 100 mg BID - now on imdur 60 mg daily - if BPs remain  elevated (150s/90s at times) consider titrating up anti-hypertensives but will hold off for now as BPs will likely be lowered by HD today   # T2DM: insulin dependent, uncontrolled. Last A1c 16.8 on 07/20/15. Reports he lost insurance June/July and was not taking insulin. Home regimen humalog 5:1 per his report, however notes he's doing lantus 60 units BID with mealtime humalog. He does not have set mealtimes d/t HD schedule and feels Lantus BID works best for him.  - management as above.   # Headache improved significantly: localized to occipital region. Not sensitive to sound, no vision changes. Improves with occipital palpation. At his time, not very suspicious for intracranial process, symptoms most consistent with tension type headache/MSK. - tylenol prn pain - muscle cream for back of neck  # ESRD: MWF dialysis since 12/2014 after developing nephrotic syndrome, followed by Dr. [name]. H/o horseshoe kidney. -continue home calcium acetate - nephrology for HD management. - consider increase in phosphorus binders per nephrology  # Hyperlipidemia: Total cholest 203. Not currently on statin at home. ASCVD risk 9%, high intensity statin recommended. Pt has history of irritability while on different statins in past. Pt notes they "make him ill" and  he gets very angry with them. He notes taking simvastatin and 2 others but cannot recall which ones.  - started lipitor 80mg  daily -on lovaza  # Hypothyroidism: stable, last TSH 1.2 on 07/20/15 -continue home synthroid  # Depression: stable -continue home celexa, clonopin - continue to monitor QTc in setting of celexa use.   # Anemia of chronic kidney disease: Hgb 9.3 on admission. Baseline Hgb 11.  -monitor CBC - pt  receives EPO with HD.   # Peripheral neuropathy: secondary to T2DM -restarting gabapentin to appropriate renal dose, 100 TID - not continuing home pain meds for now- has 1 time dose of PRN Dilaudid Iv. - tramadol prn  pain started by cardiology.   FEN/GI: carb modified/renal diet Prophylaxis: heparin drip  Disposition: Home  Subjective:  Russell [name] was in HD this am. Feels well, denies chest pain and reports headache is very minor. Desires nasal decongestant. He lost insurance this June/July and did not have insulin during that time. He does not eat regular meals or at set mealtimes and believes Lantus BID with humalog as needed when he eats is a good regimen for him. Pt encouraged to get out of bed regularly/walk the halls as much as he can before his CABG.  Objective: Temp:  [97.6 F (36.4 C)-98 F (36.7 C)] 97.6 F (36.4 C) (09/22 0421) Pulse Rate:  [68-76] 69 (09/22 0421) Resp:  [12-27] 12 (09/22 0421) BP: (117-196)/(65-84) 128/67 (09/22 0421) SpO2:  [98 %-100 %] 100 % (09/22 0421) Weight:  [124.5 kg (274 lb 8 oz)] 124.5 kg (274 lb 8 oz) (09/22 0421) Physical Exam: General: Obese male lying in bed in NAD, getting HD Cardiovascular: RRR, no murmur  Respiratory: CTAB, no increased work of breathing Abdomen: soft, not tender, bowel sounds present. No rebound or guarding.  Skin: Warm, dry, no peripheral edema. Small healing wound on medial R great toe.  Laboratory:  Recent Labs Lab 09/22/15 0226 09/23/15 0301 09/24/15 0351  WBC 6.4 7.2 7.2  HGB 9.1* 9.5* 8.9*  HCT 28.2* 29.7* 28.1*  PLT 295 286 271    Recent Labs Lab 09/20/15 1202  09/22/15 0226 09/23/15 0301 09/24/15 0351  NA 129*  < > 132* 136 135  K 4.5  < > 3.9 3.6 3.9  CL 94*  < > 100* 98* 97*  CO2 24  < > 21* 29 26  BUN 40*  < > 59* 31* 49*  CREATININE 4.89*  < > 6.75* 5.02* 6.96*  CALCIUM 8.2*  < > 8.5* 8.2* 8.5*  PROT 6.5  --   --   --   --   BILITOT 0.5  --   --   --   --   ALKPHOS 100  --   --   --   --   ALT 15*  --   --   --   --   AST 22  --   --   --   --   GLUCOSE 754*  < > 336* 170* 173*  < > = values in this interval not displayed.  Risk Stratification Labs  TSH    Component Value Date/Time   TSH  1.243 07/20/2015 0427   TSH 3.492 04/10/2014 1054   Hemoglobin A1C    Component Value Date/Time   HGBA1C 16.8 (H) 07/20/2015 0427   Lipid Panel     Component Value Date/Time   CHOL 203 (H) 09/21/2015 0245   TRIG 294 (H) 09/21/2015 0245   HDL 33 (L) 09/21/2015 0245   CHOLHDL 6.2 09/21/2015 0245   VLDL 59 (H) 09/21/2015 0245   LDLCALC 111 (H) 09/21/2015 0245   ABG: 7.4 / 39.5 / 76 / 24.7 / 95%  Osmolality: 304 Pt: 14.7 INR: 1.09 UDS: negative  UA: nitrites, ketones, leuks neg. Granular casts, mod Hgb, 0-5 RBCs, protein >300  Imaging/Diagnostic Tests:  ECHO 09/21/2015 ------------------------------------------------------------------- Study Conclusions  - Left ventricle: The cavity size was normal. Septal  wall thickness was increased in a pattern of moderate LVH with severe hypertrophy of the posterior wall. Systolic function was normal. The estimated ejection fraction was in the range of 60% to 65%.   Wall motion was normal; there were no regional wall motion abnormalities. Doppler parameters are consistent with abnormal left ventricular relaxation (grade 1 diastolic dysfunction). Doppler parameters are consistent with indeterminate ventricular filling pressure. - Aortic valve: Transvalvular velocity was within the normal range.There was no stenosis. There was no regurgitation. - Mitral valve: Transvalvular velocity was within the normal range.There was no evidence for stenosis. There was trivial regurgitation. - Right ventricle: The cavity size was normal. Wall thickness was normal. Systolic function was normal. - Tricuspid valve: There was no regurgitation. - Pulmonic valve: There was no regurgitation.  CATH 09/23/15  Ramus lesion, 90 %stenosed.  Ost 1st Diag to 1st Diag lesion, 95 %stenosed.  Prox LAD lesion, 80 %stenosed.  The left ventricular systolic function is normal.  LV end diastolic pressure is mildly elevated.  The left ventricular ejection fraction is  55-65% by visual estimate.  1. Severe two-vessel coronary arte involving a bifurcation proximal LAD/diagonal 1 and proximal large ramus. 2. Normal LV systolic function with mildly elevated LVEDP.   Russell [name], Medical Student 09/24/2015, 7:28 AM [facility] FPTS Intern pager: [phone], text pages welcome  RESIDENT ADDENDUM  I have separately seen and examined the patient. I have discussed the findings and exam with the medical student and agree with the above note, which I have edited appropriately. I helped develop the management plan that is described in the student's note, and I agree with the content.  Additionally I have outlined my exam and assessment/plan below:   Denies chest pain. Feels comfortable while getting HD this AM.   PE:  Vitals:   09/24/15 0859 09/24/15 0929  BP: 123/76   Pulse: 67 69  Resp: 14 18  Temp:     General: Lying in bed on HD, non-toxic.  Eyes: Conjunctivae non-injected.  ENTM: Moist mucous membranes. Oropharynx clear. No nasal discharge.  Neck: Supple, no LAD Cardiovascular: RRR. No murmurs, rubs, or gallops noted. Trace pitting edema noted. Respiratory: No increased WOB. CTAB without wheezing, rhonchi, or crackles noted. Abdomen: +BS, soft, non-distended, non-tender.  A/P:  45 y/o male presenting with hyperglycemia >700 consistent for HHS. Also noted to have chest pain s/p cath on 9/20 showing severe 2 vessel disease.   CAD: severe 2 vessel disease. CVTS is planning to do a triple bypass on Tuesday after his volume status is more improved. Currently without chest pain.  - cards and CVTS following - on heparin gtt - nitro gtt discontinued   DM/HSS: HSS resolved. Patient reports 2 month period without insulin at home. CBGs 169-266.  - received Lantus 36 units last night.  - will add Novolog 3 units TID with meals, may need to increase based on CBGs today  -sensitive SSI.   HTN: significantly elevated during/after cath.  Improved with nitro gtt. - continue to monitor  - could consider titrating up amlodipine or metoprolol but will hold off as patient is receiving HD this AM and this will likely lower his BPs   ESRD - renal following, HD today  - phosphorus up will leave to renal on whether to increase phosphorus binder  Russell [name], D.O. 09/24/2015, 9:41 AM PGY-2, [facility]

## 2015-09-24 NOTE — Progress Notes (Signed)
Patient Name: Russell [name] Date of Encounter: 09/24/2015  Primary Cardiologist: Dr. Janae Sauce Problem List     Active Problems:   DKA (diabetic ketoacidoses) [facility])   NSTEMI (non-ST elevated myocardial infarction) (HCC)   Hyperglycemia due to type 2 diabetes mellitus (HCC)   Coronary atherosclerosis of native coronary artery     Subjective   Seen in HD, feels ok. No chest pain or SOB.   Inpatient Medications    Scheduled Meds: . amLODipine  5 mg Oral Daily  . aspirin EC  81 mg Oral Once  . atorvastatin  80 mg Oral q1800  . calcitRIOL  0.25 mcg Oral Q M,W,F-HD  . calcium acetate  2,001 mg Oral TID WC  . citalopram  60 mg Oral QHS  . clonazePAM  2 mg Oral QHS  . [START ON 09/29/2015] darbepoetin (ARANESP) injection - DIALYSIS  100 mcg Intravenous Q Wed-HD  . feeding supplement (NEPRO CARB STEADY)  237 mL Oral BID BM  . fluticasone  1 spray Each Nare Daily  . gabapentin  100 mg Oral TID  . gi cocktail  30 mL Oral Once  . heparin  8,000 Units Dialysis Once in dialysis  . insulin aspart  0-5 Units Subcutaneous QHS  . insulin aspart  0-9 Units Subcutaneous TID WC  . insulin aspart  3 Units Subcutaneous TID WC  . insulin glargine  36 Units Subcutaneous QHS  . isosorbide mononitrate  60 mg Oral Daily  . levothyroxine  50 mcg Oral QAC breakfast  . metoprolol tartrate  100 mg Oral BID  . multivitamin  1 tablet Oral QHS  . omega-3 acid ethyl esters  2 g Oral BID  . sodium chloride flush  3 mL Intravenous Q12H   Continuous Infusions: . sodium chloride 10 mL/hr at 09/24/15 0700  . heparin 1,650 Units/hr (09/24/15 0700)   PRN Meds:.sodium chloride, sodium chloride, sodium chloride, sodium chloride, sodium chloride, acetaminophen, alteplase, gi cocktail, heparin, hydrALAZINE, lidocaine (PF), lidocaine-prilocaine, metoCLOPramide, MUSCLE RUB, pentafluoroprop-tetrafluoroeth, sodium chloride flush, traMADol   Vital Signs    Vitals:   09/24/15 0929 09/24/15 0959  09/24/15 1028 09/24/15 1059  BP: (!) 143/76 (!) 141/77 (!) 146/83 (!) 155/74  Pulse: 69 67 67 68  Resp: 18 13 13 11   Temp:      TempSrc:      SpO2:      Weight:      Height:        Intake/Output Summary (Last 24 hours) at 09/24/15 1122 Last data filed at 09/24/15 0700  Gross per 24 hour  Intake          1137.08 ml  Output                0 ml  Net          1137.08 ml   Filed Weights   09/23/15 0316 09/24/15 0421 09/24/15 0753  Weight: 273 lb 4.8 oz (124 kg) 274 lb 8 oz (124.5 kg) 267 lb 3.2 oz (121.2 kg)    Physical Exam   GEN: Well nourished, well developed, in no acute distress.  HEENT: Grossly normal.  Neck: Supple, no JVD, carotid bruits, or masses. Cardiac: RRR, no murmurs, rubs, or gallops. No clubbing, cyanosis, edema.  Radials/DP/PT 2+ and equal bilaterally.  Respiratory:  Respirations regular and unlabored, clear to auscultation bilaterally. GI: Soft, nontender, nondistended, BS + x 4. MS: no deformity or atrophy. Skin: warm and dry, no rash. Neuro:  Strength and  sensation are intact. Psych: AAOx3.  Normal affect.  Labs    CBC  Recent Labs  09/23/15 0301 09/24/15 0351  WBC 7.2 7.2  HGB 9.5* 8.9*  HCT 29.7* 28.1*  MCV 87.9 87.0  PLT 286 271   Basic Metabolic Panel  Recent Labs  09/23/15 0301 09/24/15 0351  NA 136 135  K 3.6 3.9  CL 98* 97*  CO2 29 26  GLUCOSE 170* 173*  BUN 31* 49*  CREATININE 5.02* 6.96*  CALCIUM 8.2* 8.5*  PHOS 6.0* 7.6*   Liver Function Tests  Recent Labs  09/23/15 0301 09/24/15 0351  ALBUMIN 2.8* 2.8*     Telemetry     NSR, Sinus tach- Personally Reviewed  ECG     last was on 09/21/15, sinus tach- Personally Reviewed   Cardiac Studies  Left Heart Cath and Coronary Angiography 09/22/15  Ramus lesion, 90 %stenosed.  Ost 1st Diag to 1st Diag lesion, 95 %stenosed.  Prox LAD lesion, 80 %stenosed.  The left ventricular systolic function is normal.  LV end diastolic pressure is mildly elevated.  The  left ventricular ejection fraction is 55-65% by visual estimate.   1. Severe two-vessel coronary arte involving a bifurcation proximal LAD/diagonal 1 and proximal large ramus. 2. Normal LV systolic function with mildly elevated LVEDP.   Transthoracic Echocardiography 09/21/15 Study Conclusions  - Left ventricle: The cavity size was normal. Septal wall thickness   was increased in a pattern of moderate LVH with severe   hypertrophy of the posterior wall. Systolic function was normal.   The estimated ejection fraction was in the range of 60% to 65%.   Wall motion was normal; there were no regional wall motion   abnormalities. Doppler parameters are consistent with abnormal   left ventricular relaxation (grade 1 diastolic dysfunction).   Doppler parameters are consistent with indeterminate ventricular   filling pressure. - Aortic valve: Transvalvular velocity was within the normal range.   There was no stenosis. There was no regurgitation. - Mitral valve: Transvalvular velocity was within the normal range.   There was no evidence for stenosis. There was trivial   regurgitation. - Right ventricle: The cavity size was normal. Wall thickness was   normal. Systolic function was normal. - Tricuspid valve: There was no regurgitation. - Pulmonic valve: There was no regurgitation.    Patient Profile   45 yo male w/ hx IDDM, ESRD on HD, HTN, GERD, anemia, depression, hypothyroid, neuropathy. Admitted 09/18 w/ DKA and chest pain/NSTEMI. S/p cath w/ 2 v dz, TCTS has seen, CABG next week.     Assessment & Plan  1. NSTEMI: Peak troponin 2.10. Echo with moderate LVH with severe hypertrophy of the posteriorwall. EF 60% to 65% - cardiac cath w/ severe 2v CAD, TCTS has seen, for CABG on Tuesday 9/26 - continue heparin, nitrates, ASA, BB and high-dose statin.  2. HTN: improved today with resumption of amlodipine 5 mg and metoprolol increased to 100mg  bid. With dialysis decreased to 108/66.  -  will add prn hydralazine for BP, since higher dose IV NTG gives him headaches  3. Uncontrolled GE:XBMWUX anion gap; treated with IV insulin now on subcu, followed by medicine service.   4. Hyperlipidemia with an atherogenic panel with increased TG, low HDL increased VLDL;  - suspect significantly increased LDL particles despite LDL only 111;  - with DM needs aggressive intervention; on atorvastatin 80 - add lovaza or vascepa 2 capsule bid. Consider fenofibrate Rx.   5. ESRD on dialysis; meds  held prior to dialysis to avoid hypotension, per IM/Nephrology  6. Anemia of CKD today 8.9, per IM   7. Hypothyroidismon synthroid replacement. Per IM    Signed, Little IshikawaErin E Smith, NP  09/24/2015, 11:22 AM   Patient seen and examined. Agree with assessment and plan. No chest pain. Back from dialysis. BP 186 systolic. Will titrate amlodipine to 10 mg daily. CABG tentatively set for Tuesday. Continue iv heparin.   Lennette Biharihomas A. Brittney Caraway, MD, [facility] 09/24/2015 1:18 PM

## 2015-09-24 NOTE — Progress Notes (Signed)
[facility] Progress Note   Dialysis Orders: [facility] MWF 4 hours 450/800 117 kg 2.0 K/2.5 Ca bath. UF profile 4 Heparin 8000 units IV at start of tx Heparin 3000 units IV mid run Mircera 75 mcg IV q 2 weeks (last dose 09/15/15 Has HGB 10.0 09/15/15) Venofer 50 mg IV weekly (last dose 09*131/17 Fe 75 Tsat 29 08/18/15) Calcitriol 0.25 mcg PO q treatment (last PTH 424 08/18/15)  Ca 8.7 C Ca 8.9 Phos 7.6 (08/18/15) Takes Phoslo 667 mg 3 capsules PO TID AC and 2 capsules with snacks.  Assessment/Plan: 1.  Chest pain/Severe 2 vessel disease per cardiac cath 09/22/15. Ramus lesion, 90 %stenosedOst 1st Diag to 1st Diag lesion, 95 %stenosed, 95 %stenosed.Prox LAD lesion, 80 %stenosed. EF 55-65%. CVTS consulted. For CABG  Tuesday. on heparin drip.  2. ESRD -MWF on HD 4 K  Bath for K 3.9 - next HD Monday 3. Anemia - HGB 8.9 -Rec'd Aranesp 40 mcg IV 09/20- needs larger dose increase to 100 per week starting next week; transfuse prn after surgery 4. Secondary hyperparathyroidism - P 7.6/ VDRA. -follow labs 5. HTN/volume - HD 09/22/15 Pre wt 121.2 kg  Post wt 118 kg left 1 kg above EDW. BP soft toward end of tx. Cont. Metoprolol/Amlodipine. NTG for chest pain. Goal 4 kg 6. Nutrition - Albumin 2.8. PCM in the setting of obesity. Renal/Carb mod diet. Nepro/renal vit 7. DM: per primary.  8. Hypothyroidism: per primary 9. H/O Headaches: thought to be tension HA. Tramadol ordered.   [name], PA-C [facility] Beeper [phone] 09/24/2015,10:29 AM  LOS: 4 days   Pt seen, examined and agree w A/P as above. Stable on HD now.  [name] MD [employer] pager [phone]    cell [phone] 09/24/2015, 10:58 AM    Subjective:   For surgery Tuesday . No c/o  Objective Vitals:   09/24/15 0859 09/24/15 0929 09/24/15 0959 09/24/15 1028  BP: 123/76 (!) 143/76 (!) 141/77   Pulse: 67 69 67 67  Resp: 14 18 13 13   Temp:      TempSrc:       SpO2:      Weight:      Height:       Physical Exam General:  NAD on HD Heart: RRR Lungs: no rales Abdomen: obese Extremities:  No sig LE edema Dialysis Access: right lower AVF   Additional Objective Labs: Basic Metabolic Panel:  Recent Labs Lab 09/22/15 0226 09/23/15 0301 09/24/15 0351  NA 132* 136 135  K 3.9 3.6 3.9  CL 100* 98* 97*  CO2 21* 29 26  GLUCOSE 336* 170* 173*  BUN 59* 31* 49*  CREATININE 6.75* 5.02* 6.96*  CALCIUM 8.5* 8.2* 8.5*  PHOS  --  6.0* 7.6*   Liver Function Tests:  Recent Labs Lab 09/20/15 1202 09/23/15 0301 09/24/15 0351  AST 22  --   --   ALT 15*  --   --   ALKPHOS 100  --   --   BILITOT 0.5  --   --   PROT 6.5  --   --   ALBUMIN 3.1* 2.8* 2.8*   CBC:  Recent Labs Lab 09/20/15 1202 09/21/15 0245 09/22/15 0226 09/23/15 0301 09/24/15 0351  WBC 6.3 7.0 6.4 7.2 7.2  HGB 9.3* 9.4* 9.1* 9.5* 8.9*  HCT 27.6* 28.8* 28.2* 29.7* 28.1*  MCV 82.9 84.2 86.0 87.9 87.0  PLT 262 290 295 286 271    Cardiac  Enzymes:  Recent Labs Lab 09/20/15 1542 09/20/15 1855 09/20/15 2201 09/21/15 0027  TROPONINI 1.71* 2.10* 1.83* 1.68*   CBG:  Recent Labs Lab 09/23/15 0617 09/23/15 1129 09/23/15 1701 09/23/15 2113 09/24/15 0613  GLUCAP 154* 178* 266* 220* 169*    Lab Results  Component Value Date   INR 1.04 09/24/2015   INR 1.08 09/23/2015   INR 0.93 09/22/2015   Studies/Results: No results found. Medications: . sodium chloride 10 mL/hr at 09/24/15 0700  . heparin 1,650 Units/hr (09/24/15 0700)   . amLODipine  5 mg Oral Daily  . aspirin EC  81 mg Oral Once  . atorvastatin  80 mg Oral q1800  . calcitRIOL  0.25 mcg Oral Q M,W,F-HD  . calcium acetate  2,001 mg Oral TID WC  . citalopram  60 mg Oral QHS  . clonazePAM  2 mg Oral QHS  . darbepoetin (ARANESP) injection - DIALYSIS  40 mcg Intravenous Q Wed-HD  . feeding supplement (NEPRO CARB STEADY)  237 mL Oral BID BM  . fluticasone  1 spray Each Nare Daily  . gabapentin   100 mg Oral TID  . gi cocktail  30 mL Oral Once  . heparin  8,000 Units Dialysis Once in dialysis  . insulin aspart  0-5 Units Subcutaneous QHS  . insulin aspart  0-9 Units Subcutaneous TID WC  . insulin aspart  3 Units Subcutaneous TID WC  . insulin glargine  36 Units Subcutaneous QHS  . isosorbide mononitrate  60 mg Oral Daily  . levothyroxine  50 mcg Oral QAC breakfast  . metoprolol tartrate  100 mg Oral BID  . multivitamin  1 tablet Oral QHS  . omega-3 acid ethyl esters  2 g Oral BID  . sodium chloride flush  3 mL Intravenous Q12H

## 2015-09-24 NOTE — Progress Notes (Signed)
Inpatient Diabetes Program Recommendations  AACE/ADA: New Consensus Statement on Inpatient Glycemic Control (2015)  Target Ranges:  Prepandial:   less than 140 mg/dL      Peak postprandial:   less than 180 mg/dL (1-2 hours)      Critically ill patients:  140 - 180 mg/dL   Lab Results  Component Value Date   GLUCAP 169 (H) 09/24/2015   HGBA1C 16.8 (H) 07/20/2015   Results for [name], Gardy B (MRN [id number]) as of 09/24/2015 09:12  Ref. Range 09/23/2015 06:17 09/23/2015 11:29 09/23/2015 17:01 09/23/2015 21:13 09/24/2015 06:13  Glucose-Capillary Latest Ref Range: 65 - 99 mg/dL 409154 (H) 811178 (H) 914266 (H) 220 (H) 169 (H)     Diabetes history:Type 2 diabetes with ESRD Outpatient Diabetes medications: Lantus 60 units bid, Humalog 15-30 units (1 units for every 5 grams of CHO with evening meal)  Current orders for Inpatient glycemic control: Lantus 36 units q HS, Novolog sensitive tid with meals, Novolog 0-5 units qhs and Novolog 3 units tid with meals  Inpatient Diabetes Program Recommendations:  Please consider increasing Novolog meal coverage 5 units tid with meals (hold if patient eats less than 50%).  (Note that the post prandial blood sugars remain elevated despite 3 units of mealtime Novolog.)  Susette RacerJulie Dayvon Dax, RN, [place], [place]HA, CDE Diabetes Coordinator Inpatient Diabetes Program  [phone] (Team Pager) [phone] [facility] Office) 09/24/2015 9:16 AM

## 2015-09-25 ENCOUNTER — Other Ambulatory Visit (HOSPITAL_COMMUNITY): Payer: Managed Care, Other (non HMO)

## 2015-09-25 LAB — RENAL FUNCTION PANEL
ALBUMIN: 3 g/dL — AB (ref 3.5–5.0)
Anion gap: 14 (ref 5–15)
BUN: 32 mg/dL — AB (ref 6–20)
CHLORIDE: 96 mmol/L — AB (ref 101–111)
CO2: 22 mmol/L (ref 22–32)
CREATININE: 5.63 mg/dL — AB (ref 0.61–1.24)
Calcium: 8.7 mg/dL — ABNORMAL LOW (ref 8.9–10.3)
GFR calc Af Amer: 13 mL/min — ABNORMAL LOW (ref >60)
GFR, EST NON AFRICAN AMERICAN: 11 mL/min — AB (ref >60)
GLUCOSE: 189 mg/dL — AB (ref 65–99)
PHOSPHORUS: 6.1 mg/dL — AB (ref 2.5–4.6)
POTASSIUM: 3.6 mmol/L (ref 3.5–5.1)
Sodium: 132 mmol/L — ABNORMAL LOW (ref 135–145)

## 2015-09-25 LAB — CBC
HEMATOCRIT: 31.1 % — AB (ref 39.0–52.0)
HEMOGLOBIN: 9.8 g/dL — AB (ref 13.0–17.0)
MCH: 27.8 pg (ref 26.0–34.0)
MCHC: 31.5 g/dL (ref 30.0–36.0)
MCV: 88.4 fL (ref 78.0–100.0)
Platelets: 266 10*3/uL (ref 150–400)
RBC: 3.52 MIL/uL — ABNORMAL LOW (ref 4.22–5.81)
RDW: 16.4 % — ABNORMAL HIGH (ref 11.5–15.5)
WBC: 7.2 10*3/uL (ref 4.0–10.5)

## 2015-09-25 LAB — HEPARIN LEVEL (UNFRACTIONATED): Heparin Unfractionated: 0.39 IU/mL (ref 0.30–0.70)

## 2015-09-25 LAB — GLUCOSE, CAPILLARY
GLUCOSE-CAPILLARY: 195 mg/dL — AB (ref 65–99)
GLUCOSE-CAPILLARY: 272 mg/dL — AB (ref 65–99)
Glucose-Capillary: 265 mg/dL — ABNORMAL HIGH (ref 65–99)
Glucose-Capillary: 277 mg/dL — ABNORMAL HIGH (ref 65–99)

## 2015-09-25 LAB — PROTIME-INR
INR: 1.06
PROTHROMBIN TIME: 13.8 s (ref 11.4–15.2)

## 2015-09-25 MED ORDER — AMLODIPINE BESYLATE 10 MG PO TABS
10.0000 mg | ORAL_TABLET | Freq: Every day | ORAL | Status: DC
  Administered 2015-09-25 – 2015-09-27 (×3): 10 mg via ORAL
  Filled 2015-09-25 (×3): qty 1

## 2015-09-25 MED ORDER — TRAMADOL HCL 50 MG PO TABS
50.0000 mg | ORAL_TABLET | Freq: Two times a day (BID) | ORAL | Status: DC | PRN
  Administered 2015-09-25 – 2015-09-27 (×4): 50 mg via ORAL
  Filled 2015-09-25 (×4): qty 1

## 2015-09-25 NOTE — Progress Notes (Signed)
Patient Name: Russell [name] Date of Encounter: 09/25/2015  Primary Cardiologist: Dr. Janae Sauce Problem List     Active Problems:   DKA (diabetic ketoacidoses) [facility])   NSTEMI (non-ST elevated myocardial infarction) (HCC)   Hyperglycemia due to type 2 diabetes mellitus (HCC)   Coronary atherosclerosis of native coronary artery     Subjective   Feels well, no chest pain or SOB.   Inpatient Medications    Scheduled Meds: . amLODipine  5 mg Oral Daily  . aspirin EC  81 mg Oral Once  . atorvastatin  80 mg Oral q1800  . calcitRIOL  0.25 mcg Oral Q M,W,F-HD  . calcium acetate  2,001 mg Oral TID WC  . citalopram  60 mg Oral QHS  . clonazePAM  2 mg Oral QHS  . [START ON 09/29/2015] darbepoetin (ARANESP) injection - DIALYSIS  100 mcg Intravenous Q Wed-HD  . feeding supplement (NEPRO CARB STEADY)  237 mL Oral BID BM  . fluticasone  1 spray Each Nare Daily  . gabapentin  100 mg Oral TID  . gi cocktail  30 mL Oral Once  . heparin  8,000 Units Dialysis Once in dialysis  . insulin aspart  0-5 Units Subcutaneous QHS  . insulin aspart  0-9 Units Subcutaneous TID WC  . insulin glargine  20 Units Subcutaneous BID  . isosorbide mononitrate  60 mg Oral Daily  . levothyroxine  50 mcg Oral QAC breakfast  . metoprolol tartrate  100 mg Oral BID  . multivitamin  1 tablet Oral QHS  . omega-3 acid ethyl esters  2 g Oral BID  . sodium chloride flush  3 mL Intravenous Q12H   Continuous Infusions: . sodium chloride 10 mL/hr at 09/25/15 0215  . heparin 1,650 Units/hr (09/24/15 2233)   PRN Meds:.sodium chloride, sodium chloride, sodium chloride, acetaminophen, alteplase, gi cocktail, heparin, hydrALAZINE, lidocaine (PF), lidocaine-prilocaine, metoCLOPramide, MUSCLE RUB, pentafluoroprop-tetrafluoroeth, sodium chloride flush, traMADol   Vital Signs    Vitals:   09/24/15 1927 09/24/15 2224 09/25/15 0427 09/25/15 0732  BP: 139/69 139/69 (!) 117/54 (!) 111/58  Pulse: 74 70 66 69  Resp:  20  12 (!) 22  Temp: 97.3 F (36.3 C)  97 F (36.1 C) 98.8 F (37.1 C)  TempSrc: Oral  Axillary Oral  SpO2: 97%  97% 98%  Weight:   270 lb 9.6 oz (122.7 kg)   Height:        Intake/Output Summary (Last 24 hours) at 09/25/15 0801 Last data filed at 09/25/15 0215  Gross per 24 hour  Intake          1197.63 ml  Output             4200 ml  Net         -3002.37 ml   Filed Weights   09/24/15 0753 09/24/15 1220 09/25/15 0427  Weight: 267 lb 3.2 oz (121.2 kg) 258 lb 9.6 oz (117.3 kg) 270 lb 9.6 oz (122.7 kg)    Physical Exam   GEN: Well nourished, well developed, obese male in no acute distress.  HEENT: Grossly normal.  Neck: Supple, no JVD, carotid bruits, or masses. Cardiac: RRR, no murmurs, rubs, or gallops. No clubbing, cyanosis, edema.  Radials/DP/PT 2+ and equal bilaterally.  Respiratory:  Respirations regular and unlabored, clear to auscultation bilaterally. GI: Soft, nontender, nondistended, BS + x 4. MS: no deformity or atrophy. Skin: warm and dry, no rash. Neuro:  Strength and sensation are intact. Psych: AAOx3.  Normal affect.  Labs    CBC  Recent Labs  09/23/15 0301 09/24/15 0351  WBC 7.2 7.2  HGB 9.5* 8.9*  HCT 29.7* 28.1*  MCV 87.9 87.0  PLT 286 271   Basic Metabolic Panel  Recent Labs  09/24/15 0351 09/25/15 0544  NA 135 132*  K 3.9 3.6  CL 97* 96*  CO2 26 22  GLUCOSE 173* 189*  BUN 49* 32*  CREATININE 6.96* 5.63*  CALCIUM 8.5* 8.7*  PHOS 7.6* 6.1*   Liver Function Tests  Recent Labs  09/24/15 0351 09/25/15 0544  ALBUMIN 2.8* 3.0*     Telemetry    NSR - Personally Reviewed  ECG    Sinus tach- Personally Reviewed   Cardiac Studies  Left Heart Cath and Coronary Angiography 09/22/15  Ramus lesion, 90 %stenosed.  Ost 1st Diag to 1st Diag lesion, 95 %stenosed.  Prox LAD lesion, 80 %stenosed.  The left ventricular systolic function is normal.  LV end diastolic pressure is mildly elevated.  The left ventricular ejection  fraction is 55-65% by visual estimate.  1. Severe two-vessel coronary arte involving a bifurcation proximal LAD/diagonal 1 and proximal large ramus. 2. Normal LV systolic function with mildly elevated LVEDP.   Transthoracic Echocardiography 09/21/15 Study Conclusions  - Left ventricle: The cavity size was normal. Septal wall thickness was increased in a pattern of moderate LVH with severe hypertrophy of the posterior wall. Systolic function was normal. The estimated ejection fraction was in the range of 60% to 65%. Wall motion was normal; there were no regional wall motion abnormalities. Doppler parameters are consistent with abnormal left ventricular relaxation (grade 1 diastolic dysfunction). Doppler parameters are consistent with indeterminate ventricular filling pressure. - Aortic valve: Transvalvular velocity was within the normal range. There was no stenosis. There was no regurgitation. - Mitral valve: Transvalvular velocity was within the normal range. There was no evidence for stenosis. There was trivial regurgitation. - Right ventricle: The cavity size was normal. Wall thickness was normal. Systolic function was normal. - Tricuspid valve: There was no regurgitation. - Pulmonic valve: There was no regurgitation.   Patient Profile  45 yo male w/ hx IDDM, ESRD on HD, HTN, GERD, anemia, depression, hypothyroid, neuropathy. Admitted 09/18 w/ DKA and chest pain/NSTEMI. S/p cath w/ 2 v dz, TCTS has seen, CABG next week.    Assessment & Plan  1. NSTEMI: Peak troponin 2.10. Echo with moderate LVH with severe hypertrophy of the posteriorwall. EF60% to 65% - cardiac cath w/ severe 2v CAD, TCTS has seen, for CABG on Tuesday 9/26 - continue heparin, nitrates, ASA, BB and high-dose statin.  2. HTN: improved with the increase of amlodipine to 10mg .   3. DM: management per IM.  4. Hyperlipidemia with an atherogenic panel with increased TG, low HDL  increased VLDL;  - with DM needs aggressive intervention; on atorvastatin 80 - add lovaza or vascepa 2 capsule bid. Consider fenofibrate Rx.   5. ESRD on dialysis;meds held prior to dialysis to avoid hypotension, per IM/Nephrology  6. Anemia of CKD today 8.9, per IM  7. Hypothyroidismon synthroid replacement. Per IM    Signed, Little IshikawaErin E Smith, NP  09/25/2015, 8:01 AM

## 2015-09-25 NOTE — Progress Notes (Signed)
ANTICOAGULATION CONSULT NOTE  Pharmacy Consult:  Heparin Indication: chest pain/ACS  Allergies  Allergen Reactions  . Ibuprofen Other (See Comments)    MD told him not to take due to kidney disease.  . Nsaids Other (See Comments)    Told to avoid all nsaids due to kidney disease-takes acetaminophen as needed    Patient Measurements: Height: 6\' 2"  (188 cm) Weight: 261 lb 11.2 oz (118.7 kg) IBW/kg (Calculated) : 82.2 Heparin Dosing Weight: 108 kg   Vital Signs: Temp: 97.8 F (36.6 C) (09/23 0938) Temp Source: Oral (09/23 0938) BP: 115/70 (09/23 0938) Pulse Rate: 67 (09/23 0938)  Labs:  Recent Labs  09/23/15 0301  09/23/15 1619 09/24/15 0351 09/24/15 0352 09/25/15 0544 09/25/15 0743  HGB 9.5*  --   --  8.9*  --   --  9.8*  HCT 29.7*  --   --  28.1*  --   --  31.1*  PLT 286  --   --  271  --   --  266  LABPROT 14.0  --   --  13.7  --  13.8  --   INR 1.08  --   --  1.04  --  1.06  --   HEPARINUNFRC  --   < > 0.37  --  0.38 0.39  --   CREATININE 5.02*  --   --  6.96*  --  5.63*  --   < > = values in this interval not displayed.  Estimated Creatinine Clearance: 22.9 mL/min (by C-G formula based on SCr of 5.63 mg/dL (H)).  Assessment: 45 y.o. male with chest pain to continue on IV heparin post cardiac catheterization.  Plans for CABG 9/26.  Patient's heparin level remains therapeutic this morning at 0.39 units/mL on 1650 units/hr. Hemoglobin is low/stable, platelet count within normal limits, and no bleeding noted.   Goal of Therapy:  Heparin level 0.3-0.7 units/ml Monitor platelets by anticoagulation protocol: Yes   Plan:  - Continue heparin 1650 units/hr - Daily heparin level and CBC - Monitor for signs/symptoms of bleeding - CABG planned for 9/26  Babs BertinHaley Semiah Konczal, PharmD, BCPS Clinical Pharmacist Pager [phone] 09/25/2015 10:23 AM

## 2015-09-25 NOTE — Progress Notes (Signed)
Patients blood pressure 111/58, reviewed medications with Suzzette RighterErin Smith PA. Will give amlodipine and imdur now and recheck blood pressure around 1100 prior to giving metoprolol. Plan discussed with Belenda CruiseKristin RN on [address] prior to transfer.

## 2015-09-25 NOTE — Progress Notes (Signed)
Family Medicine Teaching Service Daily Progress Note Intern Pager: [phone]  Patient name: Russell [name] Medical record number: [id number] Date of birth: [birth year] Age: 45 y.o. Gender: male  Primary Care Provider: [name], MD Consultants: Cardiology Code Status: Full  Pt Overview and Major Events to Date:  Admitted 9/18 for hyperglycemia and chest pain. 9/18: transitioned off insulin gtt, now on SQ insulin 9/20: Cath done- severe 2 vessel disease, started on nitro drip for chest pain and HTN 9/21: off nitro drip. Added imdur 60, metoprolol increased to 100 BID 9/23: increased amlodipine   Assessment and Plan: Russell BirkenheadSteven B Brozowski is a 45 y.o. male presenting with hyperglycemia, chest pain.Marland Kitchen. PMH is significant for ESRD on HD MWF, insulin dependent T2DM, HTN, anemia of chronic disease, peripheral neuropathy, hypothyroidism, depression, GERD.   # Chest Pain, unstable angina: Troponin 1.71 > 2.1 > 1.83 > 1.68. Echo this hospitalization with normal EF, moderate LVH w/ severe hypertrophy posterior wall, no valve abnormality. ASCVD score 9.9%. Cath showed severe two-vessel coronary arte involving a bifurcation proximal LAD/diagonal 1 and proximal large ramus. - cardiology consult - CVTS following.  - CABG possibly Tuesday, 8/26  - would like him to be more euvolemic prior to procedure.  - now on heparin gtt - chest pain resolved.   # HHS, resolved: Labs consistent with HHS. Possibly chest pain/MI (as above), no obvious infection, possible issue with current regimen vs compliance. See problem below for further information.  - BMP daily - Mealtime SSI - continue Lantus 20 units, D/c mealtime novolog as patient eats at odd times at home.  -CBGs q4h with meals and qHS  # T2DM: insulin dependent, uncontrolled. Last A1c 16.8 on 07/20/15. Reports he lost insurance June/July and was not taking insulin. Home regimen humalog 5:1 per his report, however notes he's doing lantus 60 units BID with  mealtime humalog. He does not have set mealtimes d/t HD schedule and feels Lantus BID works best for him.  - management as above.   # Hypertension: hx of essential hypertension. Was on amlodipine, clonidine, metoprolol, but now on them prn. Titrating up BP meds as tolerated. UDS neg. BPs elevated during/after cath. Off nitro gtt.  - amlodipine increased to 10mg , metoprolol tartrate 100 mg BID - now on imdur 60 mg daily - continue to monitor    # Headache resolved: localized to occipital region. Not sensitive to sound, no vision changes. Improves with occipital palpation. At his time, not very suspicious for intracranial process, symptoms most consistent with tension type headache/MSK. - tylenol prn pain - muscle cream for back of neck  # ESRD: MWF dialysis since 12/2014 after developing nephrotic syndrome, followed by Dr. Kathrene BongoGoldsborough. H/o horseshoe kidney. -continue home calcium acetate - nephrology for HD management.  # Hyperlipidemia: Total cholest 203. Not currently on statin at home. ASCVD risk 9%, high intensity statin recommended. Pt has history of irritability while on different statins in past. Pt notes they "make him ill" and  he gets very angry with them. He notes taking simvastatin and 2 others but cannot recall which ones.  - started lipitor 80mg  daily -on lovaza  # Hypothyroidism: stable, last TSH 1.2 on 07/20/15 -continue home synthroid  # Depression: stable -continue home celexa, Klonopin - continue to monitor QTc in setting of celexa use.   # Anemia of chronic kidney disease: Hgb 9.3 on admission. Baseline Hgb 11.  -monitor CBC - pt receives EPO with HD.   # Peripheral neuropathy: secondary to T2DM -restarting gabapentin  to appropriate renal dose, 100 TID - not continuing home pain meds for now- has 1 time dose of PRN Dilaudid Iv. - tramadol prn pain started by cardiology.   FEN/GI: carb modified/renal diet Prophylaxis: heparin drip  Disposition:  Home  Subjective:  Pt doing well overnight. Notes chest pain, headache, and abdominal pain. I noted EMR states he needed HD F/Sun/Mon prior to surgery, however he notes he talked to Dr. Tyrone Sage who was ok with him just having it on Monday.   Objective: Temp:  [97 F (36.1 C)-98.8 F (37.1 C)] 98.8 F (37.1 C) (09/23 0732) Pulse Rate:  [66-79] 69 (09/23 0732) Resp:  [11-22] 22 (09/23 0732) BP: (94-198)/(34-101) 111/58 (09/23 0732) SpO2:  [97 %-98 %] 98 % (09/23 0732) Weight:  [258 lb 9.6 oz (117.3 kg)-270 lb 9.6 oz (122.7 kg)] 270 lb 9.6 oz (122.7 kg) (09/23 0427) Physical Exam: General: Obese male sleeping comfortably. Cardiovascular: RRR, no murmur, rubs, or gallops Respiratory: CTAB, no increased work of breathing Abdomen: soft, non-distended, not tender, bowel sounds present. No rebound or guarding.  Skin: Warm, dry, no peripheral edema.   Laboratory:  Recent Labs Lab 09/23/15 0301 09/24/15 0351 09/25/15 0743  WBC 7.2 7.2 7.2  HGB 9.5* 8.9* 9.8*  HCT 29.7* 28.1* 31.1*  PLT 286 271 266    Recent Labs Lab 09/20/15 1202  09/23/15 0301 09/24/15 0351 09/25/15 0544  NA 129*  < > 136 135 132*  K 4.5  < > 3.6 3.9 3.6  CL 94*  < > 98* 97* 96*  CO2 24  < > 29 26 22   BUN 40*  < > 31* 49* 32*  CREATININE 4.89*  < > 5.02* 6.96* 5.63*  CALCIUM 8.2*  < > 8.2* 8.5* 8.7*  PROT 6.5  --   --   --   --   BILITOT 0.5  --   --   --   --   ALKPHOS 100  --   --   --   --   ALT 15*  --   --   --   --   AST 22  --   --   --   --   GLUCOSE 754*  < > 170* 173* 189*  < > = values in this interval not displayed.  Risk Stratification Labs  TSH    Component Value Date/Time   TSH 1.243 07/20/2015 0427   TSH 3.492 04/10/2014 1054   Hemoglobin A1C    Component Value Date/Time   HGBA1C 16.8 (H) 07/20/2015 0427   Lipid Panel     Component Value Date/Time   CHOL 203 (H) 09/21/2015 0245   TRIG 294 (H) 09/21/2015 0245   HDL 33 (L) 09/21/2015 0245   CHOLHDL 6.2 09/21/2015 0245    VLDL 59 (H) 09/21/2015 0245   LDLCALC 111 (H) 09/21/2015 0245   ABG: 7.4 / 39.5 / 76 / 24.7 / 95%  Osmolality: 304 Pt: 14.7 INR: 1.09 UDS: negative  UA: nitrites, ketones, leuks neg. Granular casts, mod Hgb, 0-5 RBCs, protein >300  Imaging/Diagnostic Tests:  ECHO 09/21/2015 ------------------------------------------------------------------- Study Conclusions  - Left ventricle: The cavity size was normal. Septal wall thickness was increased in a pattern of moderate LVH with severe hypertrophy of the posterior wall. Systolic function was normal. The estimated ejection fraction was in the range of 60% to 65%.   Wall motion was normal; there were no regional wall motion abnormalities. Doppler parameters are consistent with abnormal left ventricular relaxation (grade  1 diastolic dysfunction). Doppler parameters are consistent with indeterminate ventricular filling pressure. - Aortic valve: Transvalvular velocity was within the normal range.There was no stenosis. There was no regurgitation. - Mitral valve: Transvalvular velocity was within the normal range.There was no evidence for stenosis. There was trivial regurgitation. - Right ventricle: The cavity size was normal. Wall thickness was normal. Systolic function was normal. - Tricuspid valve: There was no regurgitation. - Pulmonic valve: There was no regurgitation.  CATH 09/23/15  Ramus lesion, 90 %stenosed.  Ost 1st Diag to 1st Diag lesion, 95 %stenosed.  Prox LAD lesion, 80 %stenosed.  The left ventricular systolic function is normal.  LV end diastolic pressure is mildly elevated.  The left ventricular ejection fraction is 55-65% by visual estimate.  1. Severe two-vessel coronary arte involving a bifurcation proximal LAD/diagonal 1 and proximal large ramus. 2. Normal LV systolic function with mildly elevated LVEDP.   Joanna Puff, MD 09/25/2015, 8:30 AM PGY-3, [facility] Family [facility] FPTS Intern pager: [phone],  text pages welcome

## 2015-09-25 NOTE — Progress Notes (Signed)
[facility] Progress Note    Dialysis Orders: [facility] MWF 4 hours 450/800 117 kg 2.0 K/2.5 Ca bath. UF profile 4 Heparin 8000 units IV at start of tx Heparin 3000 units IV mid run Mircera 75 mcg IV q 2 weeks (last dose 09/15/15 Has HGB 10.0 09/15/15) Venofer 50 mg IV weekly (last dose 09*131/17 Fe 75 Tsat 29 08/18/15) Calcitriol 0.25 mcg PO q treatment (last PTH 424 08/18/15)  Ca 8.7 C Ca 8.9 Phos 7.6 (08/18/15) Takes Phoslo 667 mg 3 capsules PO TID AC and 2 capsules with snacks.  Assessment/Plan: 1. NSTEMI/Severe 2 vessel disease EF 60 - 65%  For CABG  Tuesday. On heparin/asa, BB and statin 2. ESRD-MWF - next HD Monday -K 3.6 9/23  3. Anemia- HGB 9.8 9/23 -Rec'd Aranesp 40 mcg IV 09/20- needs larger dose increase to 100 starting next week; transfuse prn after surgery 4. Secondary hyperparathyroidism- P 6.1/binders VDRA. -follow labs 5.HTN/volume-  net UF 4 L on Friday with post wt 117.3  He is 1 kg over dry today, watching his fluids, volume looks good. CVP was 21 on cath Wed, 4kg off yest w HD.  6. Nutrition- Albumin 2.8. PCM in the setting of obesity. Renal/Carb mod diet. Nepro/renal vit 7. DM: per primary.  8. Hypothyroidism: per primary  Sheffield SliderMartha B Bergman, PA-C [facility] Beeper [phone] 09/25/2015,9:14 AM  LOS: 5 days   Pt seen, examined and agree w A/P as above.  Vinson Moselleob Virdie Penning MD [facility] pager [phone]    cell [phone] 09/25/2015, 10:55 AM    Subjective:   No c/o  Objective Vitals:   09/24/15 1927 09/24/15 2224 09/25/15 0427 09/25/15 0732  BP: 139/69 139/69 (!) 117/54 (!) 111/58  Pulse: 74 70 66 69  Resp: 20  12 (!) 22  Temp: 97.3 F (36.3 C)  97 F (36.1 C) 98.8 F (37.1 C)  TempSrc: Oral  Axillary Oral  SpO2: 97%  97% 98%  Weight:   122.7 kg (270 lb 9.6 oz)   Height:       Physical Exam General: NAD supine in bed on room air Heart: RRR Lungs: few crackles at bases Abdomen:  obese soft Extremities: no sig LE edema Dialysis Access: left lower AVF + bruit   Additional Objective Labs: Basic Metabolic Panel:  Recent Labs Lab 09/23/15 0301 09/24/15 0351 09/25/15 0544  NA 136 135 132*  K 3.6 3.9 3.6  CL 98* 97* 96*  CO2 29 26 22   GLUCOSE 170* 173* 189*  BUN 31* 49* 32*  CREATININE 5.02* 6.96* 5.63*  CALCIUM 8.2* 8.5* 8.7*  PHOS 6.0* 7.6* 6.1*   Liver Function Tests:  Recent Labs Lab 09/20/15 1202 09/23/15 0301 09/24/15 0351 09/25/15 0544  AST 22  --   --   --   ALT 15*  --   --   --   ALKPHOS 100  --   --   --   BILITOT 0.5  --   --   --   PROT 6.5  --   --   --   ALBUMIN 3.1* 2.8* 2.8* 3.0*   No results for input(s): LIPASE, AMYLASE in the last 168 hours. CBC:  Recent Labs Lab 09/21/15 0245 09/22/15 0226 09/23/15 0301 09/24/15 0351 09/25/15 0743  WBC 7.0 6.4 7.2 7.2 7.2  HGB 9.4* 9.1* 9.5* 8.9* 9.8*  HCT 28.8* 28.2* 29.7* 28.1* 31.1*  MCV 84.2 86.0 87.9 87.0 88.4  PLT 290 295 286 271 266  Cardiac Enzymes:  Recent Labs Lab 09/20/15 1542 09/20/15 1855 09/20/15 2201 09/21/15 0027  TROPONINI 1.71* 2.10* 1.83* 1.68*   CBG:  Recent Labs Lab 09/24/15 0613 09/24/15 1407 09/24/15 1712 09/24/15 2112 09/25/15 0613  GLUCAP 169* 88 184* 226* 195*    Lab Results  Component Value Date   INR 1.06 09/25/2015   INR 1.04 09/24/2015   INR 1.08 09/23/2015   Studies/Results: No results found. Medications: . sodium chloride 10 mL/hr at 09/25/15 0215  . heparin 1,650 Units/hr (09/24/15 2233)   . amLODipine  10 mg Oral Daily  . aspirin EC  81 mg Oral Once  . atorvastatin  80 mg Oral q1800  . calcitRIOL  0.25 mcg Oral Q M,W,F-HD  . calcium acetate  2,001 mg Oral TID WC  . citalopram  60 mg Oral QHS  . clonazePAM  2 mg Oral QHS  . [START ON 09/29/2015] darbepoetin (ARANESP) injection - DIALYSIS  100 mcg Intravenous Q Wed-HD  . feeding supplement (NEPRO CARB STEADY)  237 mL Oral BID BM  . fluticasone  1 spray Each Nare  Daily  . gabapentin  100 mg Oral TID  . heparin  8,000 Units Dialysis Once in dialysis  . insulin aspart  0-5 Units Subcutaneous QHS  . insulin aspart  0-9 Units Subcutaneous TID WC  . insulin glargine  20 Units Subcutaneous BID  . isosorbide mononitrate  60 mg Oral Daily  . levothyroxine  50 mcg Oral QAC breakfast  . metoprolol tartrate  100 mg Oral BID  . multivitamin  1 tablet Oral QHS  . omega-3 acid ethyl esters  2 g Oral BID  . sodium chloride flush  3 mL Intravenous Q12H

## 2015-09-26 ENCOUNTER — Inpatient Hospital Stay (HOSPITAL_COMMUNITY): Payer: Medicare Other

## 2015-09-26 DIAGNOSIS — I25110 Atherosclerotic heart disease of native coronary artery with unstable angina pectoris: Secondary | ICD-10-CM

## 2015-09-26 DIAGNOSIS — I2510 Atherosclerotic heart disease of native coronary artery without angina pectoris: Secondary | ICD-10-CM

## 2015-09-26 LAB — RENAL FUNCTION PANEL
ALBUMIN: 3 g/dL — AB (ref 3.5–5.0)
ANION GAP: 14 (ref 5–15)
BUN: 50 mg/dL — ABNORMAL HIGH (ref 6–20)
CALCIUM: 8.7 mg/dL — AB (ref 8.9–10.3)
CO2: 24 mmol/L (ref 22–32)
Chloride: 94 mmol/L — ABNORMAL LOW (ref 101–111)
Creatinine, Ser: 7.79 mg/dL — ABNORMAL HIGH (ref 0.61–1.24)
GFR, EST AFRICAN AMERICAN: 9 mL/min — AB (ref >60)
GFR, EST NON AFRICAN AMERICAN: 8 mL/min — AB (ref >60)
Glucose, Bld: 245 mg/dL — ABNORMAL HIGH (ref 65–99)
PHOSPHORUS: 6.7 mg/dL — AB (ref 2.5–4.6)
Potassium: 3.8 mmol/L (ref 3.5–5.1)
SODIUM: 132 mmol/L — AB (ref 135–145)

## 2015-09-26 LAB — GLUCOSE, CAPILLARY
GLUCOSE-CAPILLARY: 318 mg/dL — AB (ref 65–99)
GLUCOSE-CAPILLARY: 220 mg/dL — AB (ref 65–99)
Glucose-Capillary: 220 mg/dL — ABNORMAL HIGH (ref 65–99)
Glucose-Capillary: 318 mg/dL — ABNORMAL HIGH (ref 65–99)

## 2015-09-26 LAB — CBC
HEMATOCRIT: 28.9 % — AB (ref 39.0–52.0)
HEMOGLOBIN: 9.3 g/dL — AB (ref 13.0–17.0)
MCH: 28 pg (ref 26.0–34.0)
MCHC: 32.2 g/dL (ref 30.0–36.0)
MCV: 87 fL (ref 78.0–100.0)
Platelets: 284 10*3/uL (ref 150–400)
RBC: 3.32 MIL/uL — AB (ref 4.22–5.81)
RDW: 16.3 % — ABNORMAL HIGH (ref 11.5–15.5)
WBC: 7 10*3/uL (ref 4.0–10.5)

## 2015-09-26 LAB — HEPARIN LEVEL (UNFRACTIONATED)
HEPARIN UNFRACTIONATED: 0.29 [IU]/mL — AB (ref 0.30–0.70)
Heparin Unfractionated: 0.46 IU/mL (ref 0.30–0.70)

## 2015-09-26 LAB — PROTIME-INR
INR: 0.97
Prothrombin Time: 12.9 seconds (ref 11.4–15.2)

## 2015-09-26 MED ORDER — INSULIN GLARGINE 100 UNIT/ML ~~LOC~~ SOLN
10.0000 [IU] | Freq: Once | SUBCUTANEOUS | Status: AC
  Administered 2015-09-26: 10 [IU] via SUBCUTANEOUS
  Filled 2015-09-26: qty 0.1

## 2015-09-26 MED ORDER — INSULIN GLARGINE 100 UNIT/ML ~~LOC~~ SOLN
30.0000 [IU] | Freq: Two times a day (BID) | SUBCUTANEOUS | Status: DC
  Administered 2015-09-26 – 2015-09-27 (×2): 30 [IU] via SUBCUTANEOUS
  Filled 2015-09-26 (×3): qty 0.3

## 2015-09-26 NOTE — Progress Notes (Addendum)
ANTICOAGULATION CONSULT NOTE  Pharmacy Consult:  Heparin Indication: chest pain/ACS  Allergies  Allergen Reactions  . Ibuprofen Other (See Comments)    MD told him not to take due to kidney disease.  . Nsaids Other (See Comments)    Told to avoid all nsaids due to kidney disease-takes acetaminophen as needed    Patient Measurements: Height: 6\' 2"  (188 cm) Weight: 266 lb 15.6 oz (121.1 kg) IBW/kg (Calculated) : 82.2 Heparin Dosing Weight: 108 kg   Vital Signs: Temp: 98 F (36.7 C) (09/24 0632) Temp Source: Oral (09/24 96040632) BP: 118/76 (09/24 54090632) Pulse Rate: 68 (09/24 0632)  Labs:  Recent Labs  09/24/15 0351 09/24/15 0352 09/25/15 0544 09/25/15 0743 09/26/15 0221  HGB 8.9*  --   --  9.8* 9.3*  HCT 28.1*  --   --  31.1* 28.9*  PLT 271  --   --  266 284  LABPROT 13.7  --  13.8  --  12.9  INR 1.04  --  1.06  --  0.97  HEPARINUNFRC  --  0.38 0.39  --  0.29*  CREATININE 6.96*  --  5.63*  --  7.79*    Estimated Creatinine Clearance: 16.7 mL/min (by C-G formula based on SCr of 7.79 mg/dL (H)).  Assessment: 45 y.o. male with chest pain to continue on IV heparin post cardiac catheterization.  Plans for CABG 9/26.  Patient's heparin level remains therapeutic at 0.46 units/mL after rate increase to 1800 units/hr. Hemoglobin is low/stable, platelet count within normal limits, and no bleeding noted.   Goal of Therapy:  Heparin level 0.3-0.7 units/ml Monitor platelets by anticoagulation protocol: Yes   Plan:  - Continue heparin 1650 units/hr - Daily heparin level and CBC - Monitor for signs/symptoms of bleeding - CABG planned for 9/26  Babs BertinHaley Zachry Hopfensperger, PharmD, BCPS Clinical Pharmacist Pager [phone] 09/26/2015 11:06 AM

## 2015-09-26 NOTE — Progress Notes (Signed)
Family Medicine Teaching Service Daily Progress Note Intern Pager: [phone]  Patient name: Russell [name] Medical record number: [id number] Date of birth: 08/05/1970 Age: 45 y.o. Gender: male  Primary Care Provider: [name], MD Consultants: Cardiology Code Status: Full  Pt Overview and Major Events to Date:  Admitted 9/18 for hyperglycemia and chest pain. 9/18: transitioned off insulin gtt, now on SQ insulin 9/20: Cath done- severe 2 vessel disease, started on nitro drip for chest pain and HTN 9/21: off nitro drip. Added imdur 60, metoprolol increased to 100 BID 9/23: increased amlodipine   Assessment and Plan: Russell [name] is a 45 y.o. male presenting with hyperglycemia, chest pain.Marland Kitchen PMH is significant for ESRD on HD MWF, insulin dependent T2DM, HTN, anemia of chronic disease, peripheral neuropathy, hypothyroidism, depression, GERD.   # Chest Pain, unstable angina:  Resolved chest pain. Troponin 1.71 > 2.1 > 1.83 > 1.68. Echo this hospitalization with normal EF, moderate LVH w/ severe hypertrophy posterior wall, no valve abnormality. ASCVD score 9.9%. Cath showed severe two-vessel coronary arte involving a bifurcation proximal LAD/diagonal 1 and proximal large ramus. - f/u cardiology recs - CVTS following.  - CABG possibly Tuesday, 8/26  - would like him to be more euvolemic prior to procedure.  - now on heparin gtt   # HHS, resolved: Glucose elevated to 200s-300s Reports taking lantus 60 BID at home although questionable compliance. Labs consistent with HHS. Possibly chest pain/MI (as above), no obvious infection, possible issue with current regimen vs compliance. See problem below for further information.  - BMP daily - Mealtime SSI - increase Lantus to 30 units BID -CBGs q4h with meals and qHS  # T2DM: insulin dependent, uncontrolled. Last A1c 16.8 on 07/20/15. Reports he lost insurance June/July and was not taking insulin. Home regimen humalog 5:1 per his report,  however notes he's doing lantus 60 units BID with mealtime humalog. He does not have set mealtimes d/t HD schedule and feels Lantus BID works best for him.  - management as above.   # Hypertension: hx of essential hypertension. Was on amlodipine, clonidine, metoprolol, but now on them prn. Titrating up BP meds as tolerated. UDS neg. BPs elevated during/after cath. Off nitro gtt.  - amlodipine increased to 10mg , metoprolol tartrate 100 mg BID - now on imdur 60 mg daily - continue to monitor    # Headache resolved:  - tylenol prn pain - muscle cream for back of neck  # ESRD: MWF dialysis since 12/2014 after developing nephrotic syndrome, followed by Dr. [name]. H/o horseshoe kidney. - anticipate HD Monday, 9/25 -continue home calcium acetate - nephrology for HD management.  # Hyperlipidemia: Total cholest 203. Not currently on statin at home. ASCVD risk 9%, high intensity statin recommended. Pt has history of irritability while on different statins in past. Pt notes they "make him ill" and  he gets very angry with them. He notes taking simvastatin and 2 others but cannot recall which ones.  - started lipitor 80mg  daily -on lovaza  # Hypothyroidism: stable, last TSH 1.2 on 07/20/15 -continue home synthroid  # Depression: stable -continue home celexa, Klonopin - continue to monitor QTc in setting of celexa use.   # Anemia of chronic kidney disease: Hgb stable at 9.3, same as on admission. Baseline Hgb 11.  -monitor CBC - pt receives EPO with HD.   # Peripheral neuropathy: secondary to T2DM -restarting gabapentin to appropriate renal dose, 100 TID - not continuing home pain meds for now- has 1  time dose of PRN Dilaudid Iv. - tramadol prn pain started by cardiology.   FEN/GI: carb modified/renal diet Prophylaxis: heparin drip  Disposition: Home  Subjective:  Pt denies complaints, denies SOB, chest pain.   Objective: Temp:  [97.8 F (36.6 C)-98.2 F (36.8 C)] 98  F (36.7 C) (09/24 4098) Pulse Rate:  [67-70] 68 (09/24 1191) Resp:  [13-20] 20 (09/24 4782) BP: (104-118)/(57-79) 118/76 (09/24 9562) SpO2:  [96 %-99 %] 99 % (09/24 1308) Weight:  [261 lb 11.2 oz (118.7 kg)] 261 lb 11.2 oz (118.7 kg) (09/23 6578) Physical Exam: General: Obese male sitting up, reading the news paper Cardiovascular: RRR, no murmur, rubs, or gallops Respiratory: CTAB, no increased work of breathing Abdomen: soft, non-distended, not tender, bowel sounds present. No rebound or guarding.  Skin: Warm, dry, no peripheral edema.   Laboratory:  Recent Labs Lab 09/24/15 0351 09/25/15 0743 09/26/15 0221  WBC 7.2 7.2 7.0  HGB 8.9* 9.8* 9.3*  HCT 28.1* 31.1* 28.9*  PLT 271 266 284    Recent Labs Lab 09/20/15 1202  09/24/15 0351 09/25/15 0544 09/26/15 0221  NA 129*  < > 135 132* 132*  K 4.5  < > 3.9 3.6 3.8  CL 94*  < > 97* 96* 94*  CO2 24  < > 26 22 24   BUN 40*  < > 49* 32* 50*  CREATININE 4.89*  < > 6.96* 5.63* 7.79*  CALCIUM 8.2*  < > 8.5* 8.7* 8.7*  PROT 6.5  --   --   --   --   BILITOT 0.5  --   --   --   --   ALKPHOS 100  --   --   --   --   ALT 15*  --   --   --   --   AST 22  --   --   --   --   GLUCOSE 754*  < > 173* 189* 245*  < > = values in this interval not displayed.  Risk Stratification Labs  TSH    Component Value Date/Time   TSH 1.243 07/20/2015 0427   TSH 3.492 04/10/2014 1054   Hemoglobin A1C    Component Value Date/Time   HGBA1C 16.8 (H) 07/20/2015 0427   Lipid Panel     Component Value Date/Time   CHOL 203 (H) 09/21/2015 0245   TRIG 294 (H) 09/21/2015 0245   HDL 33 (L) 09/21/2015 0245   CHOLHDL 6.2 09/21/2015 0245   VLDL 59 (H) 09/21/2015 0245   LDLCALC 111 (H) 09/21/2015 0245   ABG: 7.4 / 39.5 / 76 / 24.7 / 95%  Osmolality: 304 Pt: 14.7 INR: 1.09 UDS: negative  UA: nitrites, ketones, leuks neg. Granular casts, mod Hgb, 0-5 RBCs, protein >300  Imaging/Diagnostic Tests:  ECHO  09/21/2015 ------------------------------------------------------------------- Study Conclusions  - Left ventricle: The cavity size was normal. Septal wall thickness was increased in a pattern of moderate LVH with severe hypertrophy of the posterior wall. Systolic function was normal. The estimated ejection fraction was in the range of 60% to 65%.   Wall motion was normal; there were no regional wall motion abnormalities. Doppler parameters are consistent with abnormal left ventricular relaxation (grade 1 diastolic dysfunction). Doppler parameters are consistent with indeterminate ventricular filling pressure. - Aortic valve: Transvalvular velocity was within the normal range.There was no stenosis. There was no regurgitation. - Mitral valve: Transvalvular velocity was within the normal range.There was no evidence for stenosis. There was trivial regurgitation. - Right  ventricle: The cavity size was normal. Wall thickness was normal. Systolic function was normal. - Tricuspid valve: There was no regurgitation. - Pulmonic valve: There was no regurgitation.  CATH 09/23/15  Ramus lesion, 90 %stenosed.  Ost 1st Diag to 1st Diag lesion, 95 %stenosed.  Prox LAD lesion, 80 %stenosed.  The left ventricular systolic function is normal.  LV end diastolic pressure is mildly elevated.  The left ventricular ejection fraction is 55-65% by visual estimate.  1. Severe two-vessel coronary arte involving a bifurcation proximal LAD/diagonal 1 and proximal large ramus. 2. Normal LV systolic function with mildly elevated LVEDP.   Bonney AidAlyssa A Clarann Helvey, MD 09/26/2015, 8:59 AM PGY-3, [facility] FPTS Intern pager: [phone], text pages welcome

## 2015-09-26 NOTE — Progress Notes (Signed)
Pre-op Cardiac Surgery  Carotid Findings:  1-39% ICA plaquing.  Vertebral artery flow is antegrade.  Upper Extremity Right Left  Brachial Pressures 117T restricted  Radial Waveforms T Dialysis access  Ulnar Waveforms T   Palmar Arch (Allen's Test) WNL    Findings:      Lower  Extremity Right Left  Dorsalis Pedis    Anterior Tibial 148T 187T  Posterior Tibial 153T 197T  Ankle/Brachial Indices 1.3 1.6    Findings:  ABIs appear falsely elevated, however, waveforms are normal.

## 2015-09-26 NOTE — Progress Notes (Signed)
ANTICOAGULATION CONSULT NOTE  Pharmacy Consult:  Heparin Indication: chest pain/ACS  Allergies  Allergen Reactions  . Ibuprofen Other (See Comments)    MD told him not to take due to kidney disease.  . Nsaids Other (See Comments)    Told to avoid all nsaids due to kidney disease-takes acetaminophen as needed    Patient Measurements: Height: 6\' 2"  (188 cm) Weight: 261 lb 11.2 oz (118.7 kg) IBW/kg (Calculated) : 82.2 Heparin Dosing Weight: 108 kg   Vital Signs: Temp: 98.2 F (36.8 C) (09/23 2117) Temp Source: Oral (09/23 2117) BP: 117/79 (09/23 2117) Pulse Rate: 70 (09/23 2117)  Labs:  Recent Labs  09/24/15 0351 09/24/15 0352 09/25/15 0544 09/25/15 0743 09/26/15 0221  HGB 8.9*  --   --  9.8* 9.3*  HCT 28.1*  --   --  31.1* 28.9*  PLT 271  --   --  266 284  LABPROT 13.7  --  13.8  --  12.9  INR 1.04  --  1.06  --  0.97  HEPARINUNFRC  --  0.38 0.39  --  0.29*  CREATININE 6.96*  --  5.63*  --  7.79*    Estimated Creatinine Clearance: 16.6 mL/min (by C-G formula based on SCr of 7.79 mg/dL (H)).  Assessment: 45 y.o. male with chest pain to continue on IV heparin post cardiac catheterization.  Plans for CABG 9/26. Heparin level down to slightly subtherapeutic (0.29) on 1650 units/hr. No issues with line or bleeding reported per RN. CBC stable.  Goal of Therapy:  Heparin level 0.3-0.7 units/ml Monitor platelets by anticoagulation protocol: Yes   Plan:  - Increase heparin to 1800 units/hr - Will f/u 8 hr heparin level - CABG planned for 9/26  Christoper Fabianaron Damyn Weitzel, PharmD, BCPS Clinical pharmacist, pager [phone] 09/26/2015 5:44 AM

## 2015-09-26 NOTE — Progress Notes (Signed)
[facility] 4 hours 450/800 117 kg 2.0 K/2.5 Ca bath. UF profile 4 Heparin 8000 units IV at start of tx Heparin 3000 units IV mid run Mircera 75 mcg IV q 2 weeks (last dose 09/15/15 Has HGB 10.0 09/15/15) Venofer 50 mg IV weekly (last dose 09*131/17 Fe 75 Tsat 29 08/18/15) Calcitriol 0.25 mcg PO q treatment (last PTH 424 08/18/15)  Ca 8.7 C Ca 8.9 Phos 7.6 (08/18/15) Takes Phoslo 667 mg 3 capsules PO TID AC and 2 capsules with snacks.  Assessment/Plan: 1. NSTEMI/Severe 2 vessel disease EF 60 - 65%  For CABG Tuesday. Onheparin/asa, BB and statin 2. ESRD-MWF - next HD Monday in the afternoon -K 3.8 9/24  3. Anemia- HGB 9.3 9/24 -Rec'd Aranesp 40 mcg IV 09/20- needs larger dose increase to 100 starting next week; transfuse prn after surgery 4. Secondary hyperparathyroidism- P 6.7/bindersVDRA. -follow labs- not clear why P still high -  5.HTN/volume-  net UF 4 L on Friday with post wt 117.3 Vol ok.  3-4 kg up today, UF to dry wt Mon 6. Nutrition- Albumin 2.8. PCM in the setting of obesity. Renal/Carb mod diet. Nepro/renal vit 7. DM: per primary. BS 200s 8. Hypothyroidism: per primary  Russell SliderMartha B Bergman, PA-C [facility] Beeper [phone] 09/26/2015,7:57 AM  LOS: 6 days   Pt seen, examined and agree w A/P as above.  Russell Moselleob Russell Delpizzo MD [facility] pager [phone]    cell [phone] 09/26/2015, 9:58 AM   Subjective:   No c/o except his team lost yesterday in football.  Objective Vitals:   09/25/15 1106 09/25/15 1430 09/25/15 2117 09/26/15 0632  BP: 110/72 (!) 104/57 117/79 118/76  Pulse: 67 67 70 68  Resp:  14 14 20   Temp:  97.9 F (36.6 C) 98.2 F (36.8 C) 98 F (36.7 C)  TempSrc:  Oral Oral Oral  SpO2:  98% 96% 99%  Weight:      Height:       Physical Exam General: NAD Heart: RRR Lungs: no rales Abdomen: soft NT Extremities: no edema Dialysis Access:  Left AVF + bruit   Additional  Objective Labs: Basic Metabolic Panel:  Recent Labs Lab 09/24/15 0351 09/25/15 0544 09/26/15 0221  NA 135 132* 132*  K 3.9 3.6 3.8  CL 97* 96* 94*  CO2 26 22 24   GLUCOSE 173* 189* 245*  BUN 49* 32* 50*  CREATININE 6.96* 5.63* 7.79*  CALCIUM 8.5* 8.7* 8.7*  PHOS 7.6* 6.1* 6.7*   Liver Function Tests:  Recent Labs Lab 09/20/15 1202  09/24/15 0351 09/25/15 0544 09/26/15 0221  AST 22  --   --   --   --   ALT 15*  --   --   --   --   ALKPHOS 100  --   --   --   --   BILITOT 0.5  --   --   --   --   PROT 6.5  --   --   --   --   ALBUMIN 3.1*  < > 2.8* 3.0* 3.0*  < > = values in this interval not displayed. No results for input(s): LIPASE, AMYLASE in the last 168 hours. CBC:  Recent Labs Lab 09/22/15 0226 09/23/15 0301 09/24/15 0351 09/25/15 0743 09/26/15 0221  WBC 6.4 7.2 7.2 7.2 7.0  HGB 9.1* 9.5* 8.9* 9.8* 9.3*  HCT 28.2* 29.7* 28.1* 31.1* 28.9*  MCV 86.0 87.9 87.0 88.4 87.0  PLT 295 286 271  266 284   Blood Culture No results found for: SDES, SPECREQUEST, CULT, REPTSTATUS  Cardiac Enzymes:  Recent Labs Lab 09/20/15 1542 09/20/15 1855 09/20/15 2201 09/21/15 0027  TROPONINI 1.71* 2.10* 1.83* 1.68*   CBG:  Recent Labs Lab 09/25/15 0613 09/25/15 1127 09/25/15 1603 09/25/15 2116 09/26/15 0626  GLUCAP 195* 265* 272* 277* 220*   Iron Studies: No results for input(s): IRON, TIBC, TRANSFERRIN, FERRITIN in the last 72 hours. Lab Results  Component Value Date   INR 0.97 09/26/2015   INR 1.06 09/25/2015   INR 1.04 09/24/2015   Studies/Results: No results found. Medications: . heparin 1,800 Units/hr (09/26/15 0636)   . amLODipine  10 mg Oral Daily  . aspirin EC  81 mg Oral Once  . atorvastatin  80 mg Oral q1800  . calcitRIOL  0.25 mcg Oral Q M,W,F-HD  . calcium acetate  2,001 mg Oral TID WC  . citalopram  60 mg Oral QHS  . clonazePAM  2 mg Oral QHS  . [START ON 09/29/2015] darbepoetin (ARANESP) injection - DIALYSIS  100 mcg Intravenous Q  Wed-HD  . feeding supplement (NEPRO CARB STEADY)  237 mL Oral BID BM  . fluticasone  1 spray Each Nare Daily  . gabapentin  100 mg Oral TID  . heparin  8,000 Units Dialysis Once in dialysis  . insulin aspart  0-5 Units Subcutaneous QHS  . insulin aspart  0-9 Units Subcutaneous TID WC  . insulin glargine  20 Units Subcutaneous BID  . isosorbide mononitrate  60 mg Oral Daily  . levothyroxine  50 mcg Oral QAC breakfast  . metoprolol tartrate  100 mg Oral BID  . multivitamin  1 tablet Oral QHS  . omega-3 acid ethyl esters  2 g Oral BID

## 2015-09-26 NOTE — Progress Notes (Signed)
SUBJECTIVE:  Denies chest pain or SOB  OBJECTIVE:   Vitals:   Vitals:   09/25/15 1430 09/25/15 2117 09/26/15 0632 09/26/15 0900  BP: (!) 104/57 117/79 118/76   Pulse: 67 70 68   Resp: 14 14 20    Temp: 97.9 F (36.6 C) 98.2 F (36.8 C) 98 F (36.7 C)   TempSrc: Oral Oral Oral   SpO2: 98% 96% 99%   Weight:    266 lb 15.6 oz (121.1 kg)  Height:       I&O's:   Intake/Output Summary (Last 24 hours) at 09/26/15 1147 Last data filed at 09/26/15 0901  Gross per 24 hour  Intake              200 ml  Output                0 ml  Net              200 ml   TELEMETRY: Reviewed telemetry pt in NSR:     PHYSICAL EXAM General: Well developed, well nourished, in no acute distress Head: Eyes PERRLA, No xanthomas.   Normal cephalic and atramatic  Lungs:   Clear bilaterally to auscultation and percussion. Heart:   HRRR S1 S2 Pulses are 2+ & equal. Abdomen: Bowel sounds are positive, abdomen soft and non-tender without masses Msk:  Back normal, normal gait. Normal strength and tone for age. Extremities:   No clubbing, cyanosis or edema.  DP +1 Neuro: Alert and oriented X 3. Psych:  Good affect, responds appropriately   LABS: Basic Metabolic Panel:  Recent Labs  16/10/96 0544 09/26/15 0221  NA 132* 132*  K 3.6 3.8  CL 96* 94*  CO2 22 24  GLUCOSE 189* 245*  BUN 32* 50*  CREATININE 5.63* 7.79*  CALCIUM 8.7* 8.7*  PHOS 6.1* 6.7*   Liver Function Tests:  Recent Labs  09/25/15 0544 09/26/15 0221  ALBUMIN 3.0* 3.0*   No results for input(s): LIPASE, AMYLASE in the last 72 hours. CBC:  Recent Labs  09/25/15 0743 09/26/15 0221  WBC 7.2 7.0  HGB 9.8* 9.3*  HCT 31.1* 28.9*  MCV 88.4 87.0  PLT 266 284   Cardiac Enzymes: No results for input(s): CKTOTAL, CKMB, CKMBINDEX, TROPONINI in the last 72 hours. BNP: Invalid input(s): POCBNP D-Dimer: No results for input(s): DDIMER in the last 72 hours. Hemoglobin A1C: No results for input(s): HGBA1C in the last 72  hours. Fasting Lipid Panel: No results for input(s): CHOL, HDL, LDLCALC, TRIG, CHOLHDL, LDLDIRECT in the last 72 hours. Thyroid Function Tests: No results for input(s): TSH, T4TOTAL, T3FREE, THYROIDAB in the last 72 hours.  Invalid input(s): FREET3 Anemia Panel: No results for input(s): VITAMINB12, FOLATE, FERRITIN, TIBC, IRON, RETICCTPCT in the last 72 hours. Coag Panel:   Lab Results  Component Value Date   INR 0.97 09/26/2015   INR 1.06 09/25/2015   INR 1.04 09/24/2015    RADIOLOGY: Dg Chest 2 View  Result Date: 09/20/2015 CLINICAL DATA:  Chest pain and shortness of breath. EXAM: CHEST  2 VIEW COMPARISON:  07/19/2015 FINDINGS: The heart size and mediastinal contours are within normal limits. Both lungs are clear. The visualized skeletal structures are unremarkable. IMPRESSION: No active cardiopulmonary disease. Electronically Signed   By: Kennith Center M.D.   On: 09/20/2015 13:23    Patient Profile  45 yo male w/ hx IDDM, ESRD on HD, HTN, GERD, anemia, depression, hypothyroid, neuropathy. Admitted 09/18 w/ DKA and chest pain/NSTEMI. S/p cath  w/ 2 v dz, TCTS has seen, CABG next week.   Assessment & Plan  1. NSTEMI: Peak troponin 2.10. Echo with moderate LVH with severe hypertrophy of the posteriorwall. EF60% to 65% - cardiac cath w/ severe 2v CAD, TCTS has seen, for CABG on Tuesday 9/26 - continue heparin, nitrates, ASA, BB and high-dose statin.  2. HTN: improved with the increase of amlodipine to 10mg .   3. DM: management per IM.  4. Hyperlipidemia with an atherogenic panel with increased TG, low HDL increased VLDL;  - with DM needs aggressive intervention; on atorvastatin 80 - added lovaza.  Consider fenofibrate Rx.   5. ESRD on dialysis;meds held prior to dialysis to avoid hypotension, per IM/Nephrology  6. Anemia of CKD today 9.3, per IM  7. Hypothyroidismon synthroid replacement. Per IM      Armanda Magicraci Turner, MD  09/26/2015  11:47 AM

## 2015-09-27 ENCOUNTER — Encounter (HOSPITAL_COMMUNITY): Payer: Self-pay | Admitting: Cardiology

## 2015-09-27 ENCOUNTER — Inpatient Hospital Stay (HOSPITAL_COMMUNITY): Payer: Medicare Other

## 2015-09-27 DIAGNOSIS — E039 Hypothyroidism, unspecified: Secondary | ICD-10-CM

## 2015-09-27 DIAGNOSIS — I25110 Atherosclerotic heart disease of native coronary artery with unstable angina pectoris: Secondary | ICD-10-CM

## 2015-09-27 DIAGNOSIS — E1010 Type 1 diabetes mellitus with ketoacidosis without coma: Secondary | ICD-10-CM

## 2015-09-27 LAB — CBC
HCT: 28.4 % — ABNORMAL LOW (ref 39.0–52.0)
HEMOGLOBIN: 9.3 g/dL — AB (ref 13.0–17.0)
MCH: 28.4 pg (ref 26.0–34.0)
MCHC: 32.7 g/dL (ref 30.0–36.0)
MCV: 86.9 fL (ref 78.0–100.0)
PLATELETS: 271 10*3/uL (ref 150–400)
RBC: 3.27 MIL/uL — AB (ref 4.22–5.81)
RDW: 16 % — ABNORMAL HIGH (ref 11.5–15.5)
WBC: 6.7 10*3/uL (ref 4.0–10.5)

## 2015-09-27 LAB — URINALYSIS, ROUTINE W REFLEX MICROSCOPIC
Bilirubin Urine: NEGATIVE
Glucose, UA: 250 mg/dL — AB
Ketones, ur: NEGATIVE mg/dL
Leukocytes, UA: NEGATIVE
Nitrite: NEGATIVE
Protein, ur: >300 mg/dL — AB
Specific Gravity, Urine: 1.018 (ref 1.005–1.030)
pH: 5 (ref 5.0–8.0)

## 2015-09-27 LAB — GLUCOSE, CAPILLARY
GLUCOSE-CAPILLARY: 228 mg/dL — AB (ref 65–99)
GLUCOSE-CAPILLARY: 209 mg/dL — AB (ref 65–99)
GLUCOSE-CAPILLARY: 116 mg/dL — AB (ref 65–99)
GLUCOSE-CAPILLARY: 215 mg/dL — AB (ref 65–99)

## 2015-09-27 LAB — VAS US DOPPLER PRE CABG
LEFT ECA DIAS: -12 cm/s
LEFT VERTEBRAL DIAS: 15 cm/s
Left CCA dist dias: -11 cm/s
Left CCA dist sys: -80 cm/s
Left CCA prox dias: 18 cm/s
Left CCA prox sys: 100 cm/s
Left ICA dist dias: -29 cm/s
Left ICA dist sys: -76 cm/s
Left ICA prox dias: -23 cm/s
Left ICA prox sys: -75 cm/s
RIGHT ECA DIAS: -14 cm/s
RIGHT VERTEBRAL DIAS: -17 cm/s
Right CCA prox dias: 16 cm/s
Right CCA prox sys: 83 cm/s
Right cca dist sys: -69 cm/s

## 2015-09-27 LAB — PULMONARY FUNCTION TEST
DL/VA % pred: 88 %
DL/VA: 4.31 ml/min/mmHg/L
DLCO unc % pred: 52 %
DLCO unc: 19.95 ml/min/mmHg
FEF 25-75 Post: 4.29 L/sec
FEF 25-75 Pre: 3.21 L/sec
FEF2575-%Change-Post: 33 %
FEF2575-%Pred-Post: 103 %
FEF2575-%Pred-Pre: 77 %
FEV1-%Change-Post: 11 %
FEV1-%Pred-Post: 66 %
FEV1-%Pred-Pre: 59 %
FEV1-Post: 3.07 L
FEV1-Pre: 2.76 L
FEV1FVC-%Change-Post: 0 %
FEV1FVC-%Pred-Pre: 106 %
FEV6-%Change-Post: 10 %
FEV6-%Pred-Post: 63 %
FEV6-%Pred-Pre: 57 %
FEV6-Post: 3.63 L
FEV6-Pre: 3.27 L
FEV6FVC-%Pred-Post: 103 %
FEV6FVC-%Pred-Pre: 103 %
FVC-%Change-Post: 11 %
FVC-%Pred-Post: 61 %
FVC-%Pred-Pre: 55 %
FVC-Post: 3.64 L
FVC-Pre: 3.27 L
Post FEV1/FVC ratio: 84 %
Post FEV6/FVC ratio: 100 %
Pre FEV1/FVC ratio: 84 %
Pre FEV6/FVC Ratio: 100 %
RV % pred: 149 %
RV: 3.19 L
TLC % pred: 85 %
TLC: 6.65 L

## 2015-09-27 LAB — URINE MICROSCOPIC-ADD ON

## 2015-09-27 LAB — PROTIME-INR
INR: 1.01
Prothrombin Time: 13.3 seconds (ref 11.4–15.2)

## 2015-09-27 LAB — RENAL FUNCTION PANEL
ALBUMIN: 3.2 g/dL — AB (ref 3.5–5.0)
ANION GAP: 15 (ref 5–15)
ANION GAP: 15 (ref 5–15)
Albumin: 3.4 g/dL — ABNORMAL LOW (ref 3.5–5.0)
BUN: 73 mg/dL — ABNORMAL HIGH (ref 6–20)
BUN: 77 mg/dL — ABNORMAL HIGH (ref 6–20)
CALCIUM: 8.7 mg/dL — AB (ref 8.9–10.3)
CALCIUM: 8.7 mg/dL — AB (ref 8.9–10.3)
CHLORIDE: 94 mmol/L — AB (ref 101–111)
CO2: 21 mmol/L — AB (ref 22–32)
CO2: 20 mmol/L — ABNORMAL LOW (ref 22–32)
CREATININE: 9.04 mg/dL — AB (ref 0.61–1.24)
CREATININE: 9.06 mg/dL — AB (ref 0.61–1.24)
Chloride: 93 mmol/L — ABNORMAL LOW (ref 101–111)
GFR calc non Af Amer: 6 mL/min — ABNORMAL LOW (ref >60)
GFR, EST AFRICAN AMERICAN: 7 mL/min — AB (ref >60)
GFR, EST AFRICAN AMERICAN: 7 mL/min — AB (ref >60)
GFR, EST NON AFRICAN AMERICAN: 6 mL/min — AB (ref >60)
GLUCOSE: 258 mg/dL — AB (ref 65–99)
Glucose, Bld: 213 mg/dL — ABNORMAL HIGH (ref 65–99)
PHOSPHORUS: 7.6 mg/dL — AB (ref 2.5–4.6)
Phosphorus: 6.9 mg/dL — ABNORMAL HIGH (ref 2.5–4.6)
Potassium: 4 mmol/L (ref 3.5–5.1)
Potassium: 5.2 mmol/L — ABNORMAL HIGH (ref 3.5–5.1)
SODIUM: 129 mmol/L — AB (ref 135–145)
Sodium: 129 mmol/L — ABNORMAL LOW (ref 135–145)

## 2015-09-27 LAB — BLOOD GAS, ARTERIAL
Acid-Base Excess: 3.1 mmol/L — ABNORMAL HIGH (ref 0.0–2.0)
Bicarbonate: 27 mmol/L (ref 20.0–28.0)
Drawn by: 40415
FIO2: 21
O2 Saturation: 93.7 %
Patient temperature: 98.4
pCO2 arterial: 40.7 mmHg (ref 32.0–48.0)
pH, Arterial: 7.437 (ref 7.350–7.450)
pO2, Arterial: 67.4 mmHg — ABNORMAL LOW (ref 83.0–108.0)

## 2015-09-27 LAB — SURGICAL PCR SCREEN
MRSA, PCR: NEGATIVE
STAPHYLOCOCCUS AUREUS: POSITIVE — AB

## 2015-09-27 LAB — HEPARIN LEVEL (UNFRACTIONATED): Heparin Unfractionated: 0.52 IU/mL (ref 0.30–0.70)

## 2015-09-27 MED ORDER — PAPAVERINE HCL 30 MG/ML IJ SOLN
INTRAMUSCULAR | Status: AC
  Administered 2015-09-28: 500 mL
  Filled 2015-09-27: qty 2.5

## 2015-09-27 MED ORDER — CEFUROXIME SODIUM 1.5 G IJ SOLR
1.5000 g | INTRAMUSCULAR | Status: DC
  Filled 2015-09-27: qty 1.5

## 2015-09-27 MED ORDER — HEPARIN SODIUM (PORCINE) 1000 UNIT/ML DIALYSIS: 1000.0000 [IU] | INTRAMUSCULAR | Status: DC | PRN

## 2015-09-27 MED ORDER — CITALOPRAM HYDROBROMIDE 20 MG PO TABS
40.0000 mg | ORAL_TABLET | Freq: Every day | ORAL | Status: DC
  Administered 2015-09-27: 40 mg via ORAL
  Filled 2015-09-27: qty 2

## 2015-09-27 MED ORDER — MUPIROCIN 2 % EX OINT
1.0000 "application " | TOPICAL_OINTMENT | Freq: Two times a day (BID) | CUTANEOUS | Status: AC
  Administered 2015-09-27 – 2015-10-02 (×8): 1 via NASAL
  Filled 2015-09-27 (×2): qty 22

## 2015-09-27 MED ORDER — SODIUM CHLORIDE 0.9 % IV SOLN
INTRAVENOUS | Status: DC
  Filled 2015-09-27: qty 30

## 2015-09-27 MED ORDER — SODIUM CHLORIDE 0.9 % IV SOLN: 100.0000 mL | INTRAVENOUS | Status: DC | PRN

## 2015-09-27 MED ORDER — PENTAFLUOROPROP-TETRAFLUOROETH EX AERO: 1.0000 "application " | INHALATION_SPRAY | CUTANEOUS | Status: DC | PRN

## 2015-09-27 MED ORDER — CHLORHEXIDINE GLUCONATE CLOTH 2 % EX PADS
6.0000 | MEDICATED_PAD | Freq: Once | CUTANEOUS | Status: AC
  Administered 2015-09-27: 6 via TOPICAL

## 2015-09-27 MED ORDER — SODIUM CHLORIDE 0.9 % IV SOLN
INTRAVENOUS | Status: DC
  Filled 2015-09-27: qty 40

## 2015-09-27 MED ORDER — INSULIN GLARGINE 100 UNIT/ML ~~LOC~~ SOLN: 15.0000 [IU] | Freq: Every day | SUBCUTANEOUS | Status: DC

## 2015-09-27 MED ORDER — ALBUTEROL SULFATE (2.5 MG/3ML) 0.083% IN NEBU
2.5000 mg | INHALATION_SOLUTION | Freq: Once | RESPIRATORY_TRACT | Status: AC
  Administered 2015-09-27: 2.5 mg via RESPIRATORY_TRACT

## 2015-09-27 MED ORDER — CHLORHEXIDINE GLUCONATE CLOTH 2 % EX PADS: 6.0000 | MEDICATED_PAD | Freq: Once | CUTANEOUS | Status: AC

## 2015-09-27 MED ORDER — MAGNESIUM SULFATE 50 % IJ SOLN
40.0000 meq | INTRAMUSCULAR | Status: DC
  Filled 2015-09-27: qty 10

## 2015-09-27 MED ORDER — NITROGLYCERIN IN D5W 200-5 MCG/ML-% IV SOLN
2.0000 ug/min | INTRAVENOUS | Status: AC
  Administered 2015-09-28: 5 ug/min via INTRAVENOUS
  Administered 2015-09-28: 16.6 ug/min via INTRAVENOUS
  Filled 2015-09-27: qty 250

## 2015-09-27 MED ORDER — BISACODYL 5 MG PO TBEC: 5.0000 mg | DELAYED_RELEASE_TABLET | Freq: Once | ORAL | Status: DC

## 2015-09-27 MED ORDER — EPINEPHRINE HCL 1 MG/ML IJ SOLN
0.0000 ug/min | INTRAVENOUS | Status: DC
  Filled 2015-09-27: qty 4

## 2015-09-27 MED ORDER — DOPAMINE-DEXTROSE 3.2-5 MG/ML-% IV SOLN
0.0000 ug/kg/min | INTRAVENOUS | Status: DC
  Filled 2015-09-27: qty 250

## 2015-09-27 MED ORDER — ALTEPLASE 2 MG IJ SOLR: 2.0000 mg | Freq: Once | INTRAMUSCULAR | Status: DC | PRN

## 2015-09-27 MED ORDER — DEXTROSE 5 % IV SOLN
750.0000 mg | INTRAVENOUS | Status: DC
  Filled 2015-09-27: qty 750

## 2015-09-27 MED ORDER — CHLORHEXIDINE GLUCONATE 0.12 % MT SOLN
15.0000 mL | Freq: Once | OROMUCOSAL | Status: AC
  Administered 2015-09-28: 15 mL via OROMUCOSAL
  Filled 2015-09-27: qty 15

## 2015-09-27 MED ORDER — PHENYLEPHRINE HCL 10 MG/ML IJ SOLN
30.0000 ug/min | INTRAVENOUS | Status: DC
  Filled 2015-09-27: qty 2

## 2015-09-27 MED ORDER — INSULIN GLARGINE 100 UNIT/ML ~~LOC~~ SOLN
20.0000 [IU] | Freq: Two times a day (BID) | SUBCUTANEOUS | Status: DC
  Administered 2015-09-27: 20 [IU] via SUBCUTANEOUS
  Filled 2015-09-27 (×4): qty 0.2

## 2015-09-27 MED ORDER — HEPARIN SODIUM (PORCINE) 1000 UNIT/ML DIALYSIS: 20.0000 [IU]/kg | INTRAMUSCULAR | Status: DC | PRN

## 2015-09-27 MED ORDER — TEMAZEPAM 15 MG PO CAPS: 15.0000 mg | ORAL_CAPSULE | Freq: Once | ORAL | Status: DC | PRN

## 2015-09-27 MED ORDER — LIDOCAINE-PRILOCAINE 2.5-2.5 % EX CREA: 1.0000 "application " | TOPICAL_CREAM | CUTANEOUS | Status: DC | PRN

## 2015-09-27 MED ORDER — INSULIN GLARGINE 100 UNIT/ML ~~LOC~~ SOLN
15.0000 [IU] | Freq: Two times a day (BID) | SUBCUTANEOUS | Status: DC
  Filled 2015-09-27: qty 0.15

## 2015-09-27 MED ORDER — POTASSIUM CHLORIDE 2 MEQ/ML IV SOLN
80.0000 meq | INTRAVENOUS | Status: DC
Start: 2015-09-28 — End: 2015-09-28
  Filled 2015-09-27: qty 40

## 2015-09-27 MED ORDER — VANCOMYCIN HCL 10 G IV SOLR
1500.0000 mg | INTRAVENOUS | Status: DC
  Filled 2015-09-27: qty 1500

## 2015-09-27 MED ORDER — DEXMEDETOMIDINE HCL IN NACL 400 MCG/100ML IV SOLN
0.1000 ug/kg/h | INTRAVENOUS | Status: DC
  Filled 2015-09-27: qty 100

## 2015-09-27 MED ORDER — METOPROLOL TARTRATE 12.5 MG HALF TABLET
12.5000 mg | ORAL_TABLET | Freq: Once | ORAL | Status: AC
  Administered 2015-09-28: 12.5 mg via ORAL
  Filled 2015-09-27: qty 1

## 2015-09-27 MED ORDER — CHLORHEXIDINE GLUCONATE CLOTH 2 % EX PADS
6.0000 | MEDICATED_PAD | Freq: Every day | CUTANEOUS | Status: DC
  Administered 2015-09-27: 6 via TOPICAL

## 2015-09-27 MED ORDER — SODIUM CHLORIDE 0.9 % IV SOLN
INTRAVENOUS | Status: DC
  Filled 2015-09-27: qty 2.5

## 2015-09-27 MED ORDER — LIDOCAINE HCL (PF) 1 % IJ SOLN: 5.0000 mL | INTRAMUSCULAR | Status: DC | PRN

## 2015-09-27 NOTE — Care Management Important Message (Signed)
Important Message  Patient Details  Name: Russell [name] MRN: [id number] Date of Birth: 1970/05/23   Medicare Important Message Given:  Yes    [name] Abena 09/27/2015, 10:59 AM

## 2015-09-27 NOTE — Procedures (Signed)
Patient seen on Hemodialysis. QB 450 UF goal 5.5L.  For CABG tomorrow.  No CP thus far- have asked pt to notify us immediately if this occurs. Treatment adjusted as needed.  Bufford ButtnerElizabeth Myesha Stillion MD [facility]. Cell [phone] Pager # [phone] 3:07 PM

## 2015-09-27 NOTE — Progress Notes (Signed)
Summary when presented, will represent the most accurate medication list at discharge.   Purvis SheffieldSteven B Skalski expressed concerns regarding intermittent episodes of hypotension and hypertension, as well as his diet. He states his nephrologist told him to stop taking his blood pressure medication because he would become hypotensive during and after HD. He also states he becomes hypertensive while laying down, this being the main reason he came to the ED this visit. We discussed him knowing the lifestyle modifications he needs to make, but not have the motivation to do so. He spoke to the CDE earlier, but did not find it all that helpful because he states he already knew most of the information.    Assessment: Understanding of regimen: excellent Understanding of indications: excellent Potential of compliance: fair Barriers to Obtaining Medications: No  Patient instructed to contact inpatient pharmacy team with further questions or concerns if needed.    Time spent preparing for discharge counseling: 15 minutes Time spent counseling patient: 35 minutes   Thank you for allowing pharmacy to be a part of this patient's care.  Alfredo BachJoseph Gerardine Peltz, Cleotis NipperBS, PharmD Clinical Pharmacy Resident [phone]) 09/27/2015 3:25 PM

## 2015-09-27 NOTE — Progress Notes (Signed)
Family Medicine Teaching Service Daily Progress Note Intern Pager: [phone]  Patient name: Russell [name] Medical record number: [id number] Date of birth: 22-Dec-1970 Age: 45 y.o. Gender: male  Primary Care Provider: [name], MD Consultants: Cardiology Code Status: Full  Pt Overview and Major Events to Date:  Admitted 9/18 for hyperglycemia and chest pain. 9/18: transitioned off insulin gtt, now on SQ insulin 9/20: Cath done- severe 2 vessel disease, started on nitro drip for chest pain and HTN 9/21: off nitro drip. Added imdur 60, metoprolol increased to 100 BID 9/23: increased amlodipine   Assessment and Plan: Russell [name] is a 45 y.o. male presenting with hyperglycemia, chest pain.Marland Kitchen PMH is significant for ESRD on HD MWF, insulin dependent T2DM, HTN, anemia of chronic disease, peripheral neuropathy, hypothyroidism, depression, GERD.   # Chest Pain, unstable angina:  Resolved chest pain. Troponin 1.71 > 2.1 > 1.83 > 1.68. Echo this hospitalization with normal EF, moderate LVH w/ severe hypertrophy posterior wall, no valve abnormality. ASCVD score 9.9%. Cath showed severe two-vessel coronary arte involving a bifurcation proximal LAD/diagonal 1 and proximal large ramus. - f/u cardiology recs - CVTS following - CABG Tuesday, 8/26  - would like him to be more euvolemic prior to procedure. NPO midnight 9/26 0001 - heparin gtt  # T2DM: insulin dependent, uncontrolled. Last A1c 16.8 on 07/20/15. Reports he lost insurance June/July and was not taking insulin. Home regimen humalog 5:1 per his report, however notes he's doing lantus 60 units BID with mealtime humalog. He does not have set mealtimes d/t HD schedule and feels Lantus BID works best for him.  - BMP daily - Mealtime SSI (used 17 units over the past 24 hours) -Lantus 30 units BID, halving lantus on 9/25 evening as he will be NPO 9/26. BE SURE TO RESTART Lantus 30 BID when taking PO -CBGs q4h with meals and qHS  #  Hypertension: hx of essential hypertension. Was on amlodipine, clonidine, metoprolol. Titrating up BP meds as tolerated. UDS neg. BPs elevated during/after cath. Off nitro gtt.  - amlodipine increased to 10mg , metoprolol tartrate 100 mg BID - now on imdur 60 mg daily - continue to monitor   # Headache resolved:  - tylenol prn pain - muscle cream for back of neck  # ESRD: MWF dialysis since 12/2014 after developing nephrotic syndrome, followed by Dr. [name]. H/o horseshoe kidney. - anticipate HD Monday, 9/25 -continue home calcium acetate - nephrology for HD management.  # Hyperlipidemia: Total cholesterol 203. Not currently on statin at home. ASCVD risk 9%, high intensity statin recommended. Pt has history of irritability while on different statins in past. Pt notes they "make him ill" and  he gets very angry with them. He notes taking simvastatin and 2 others but cannot recall which ones.  - started lipitor 80mg  daily -on lovaza  # Hypothyroidism: stable, last TSH 1.2 on 07/20/15 -continue home synthroid  # Depression: stable -continue home celexa, Klonopin - continue to monitor QTc in setting of celexa use.   # Anemia of chronic kidney disease: Hgb stable at 9.3, same as on admission. Baseline Hgb 11.  -monitor CBC - pt receives EPO with HD.   # Peripheral neuropathy: secondary to T2DM -restarting gabapentin to appropriate renal dose, 100 TID - not continuing home pain meds for now- has 1 time dose of PRN Dilaudid Iv. - tramadol prn pain started by cardiology.   FEN/GI: carb modified/renal diet Prophylaxis: heparin drip  Disposition: Home  Subjective:  Patient being examined  by cardiology when I arrived, in good spirits, no questions about CABG, no complaints. Joking about everyone listening to his heart.  Objective: Temp:  [97.7 F (36.5 C)-97.8 F (36.6 C)] 97.8 F (36.6 C) (09/25 0458) Pulse Rate:  [65-71] 65 (09/25 0458) Resp:  [18-20] 20 (09/25  0458) BP: (100-122)/(61-73) 110/69 (09/25 0458) SpO2:  [96 %-99 %] 99 % (09/25 0458) Weight:  [266 lb 15.6 oz (121.1 kg)] 266 lb 15.6 oz (121.1 kg) (09/24 0900) Physical Exam: General: Obese male sitting up, reading the news paper Cardiovascular: RRR, no murmur, rubs, or gallops Respiratory: CTAB, no increased work of breathing Abdomen: soft, non-distended, not tender, bowel sounds present. No rebound or guarding.  Skin: Warm, dry, no peripheral edema.   Laboratory:  Recent Labs Lab 09/25/15 0743 09/26/15 0221 09/27/15 0433  WBC 7.2 7.0 6.7  HGB 9.8* 9.3* 9.3*  HCT 31.1* 28.9* 28.4*  PLT 266 284 271    Recent Labs Lab 09/20/15 1202  09/25/15 0544 09/26/15 0221 09/27/15 0433  NA 129*  < > 132* 132* 129*  K 4.5  < > 3.6 3.8 4.0  CL 94*  < > 96* 94* 93*  CO2 24  < > 22 24 21*  BUN 40*  < > 32* 50* 73*  CREATININE 4.89*  < > 5.63* 7.79* 9.04*  CALCIUM 8.2*  < > 8.7* 8.7* 8.7*  PROT 6.5  --   --   --   --   BILITOT 0.5  --   --   --   --   ALKPHOS 100  --   --   --   --   ALT 15*  --   --   --   --   AST 22  --   --   --   --   GLUCOSE 754*  < > 189* 245* 258*  < > = values in this interval not displayed.  Risk Stratification Labs  TSH    Component Value Date/Time   TSH 1.243 07/20/2015 0427   TSH 3.492 04/10/2014 1054   Hemoglobin A1C    Component Value Date/Time   HGBA1C 16.8 (H) 07/20/2015 0427   Lipid Panel     Component Value Date/Time   CHOL 203 (H) 09/21/2015 0245   TRIG 294 (H) 09/21/2015 0245   HDL 33 (L) 09/21/2015 0245   CHOLHDL 6.2 09/21/2015 0245   VLDL 59 (H) 09/21/2015 0245   LDLCALC 111 (H) 09/21/2015 0245   ABG: 7.4 / 39.5 / 76 / 24.7 / 95%  Osmolality: 304 Pt: 14.7 INR: 1.09 UDS: negative  UA: nitrites, ketones, leuks neg. Granular casts, mod Hgb, 0-5 RBCs, protein >300  Imaging/Diagnostic Tests:  ECHO 09/21/2015 ------------------------------------------------------------------- Study Conclusions  - Left ventricle: The  cavity size was normal. Septal wall thickness was increased in a pattern of moderate LVH with severe hypertrophy of the posterior wall. Systolic function was normal. The estimated ejection fraction was in the range of 60% to 65%.   Wall motion was normal; there were no regional wall motion abnormalities. Doppler parameters are consistent with abnormal left ventricular relaxation (grade 1 diastolic dysfunction). Doppler parameters are consistent with indeterminate ventricular filling pressure. - Aortic valve: Transvalvular velocity was within the normal range.There was no stenosis. There was no regurgitation. - Mitral valve: Transvalvular velocity was within the normal range.There was no evidence for stenosis. There was trivial regurgitation. - Right ventricle: The cavity size was normal. Wall thickness was normal. Systolic function was normal. -  Tricuspid valve: There was no regurgitation. - Pulmonic valve: There was no regurgitation.  CATH 09/23/15  Ramus lesion, 90 %stenosed.  Ost 1st Diag to 1st Diag lesion, 95 %stenosed.  Prox LAD lesion, 80 %stenosed.  The left ventricular systolic function is normal.  LV end diastolic pressure is mildly elevated.  The left ventricular ejection fraction is 55-65% by visual estimate.  1. Severe two-vessel coronary arte involving a bifurcation proximal LAD/diagonal 1 and proximal large ramus. 2. Normal LV systolic function with mildly elevated LVEDP.   Garth Bigness, MD 09/27/2015, 8:28 AM PGY-1, [facility] Family Medicine FPTS Intern pager: [phone], text pages welcome

## 2015-09-27 NOTE — Progress Notes (Signed)
Dialysis Orders: [facility] MWF 4 hours 450/800 117 kg 2.0 K/2.5 Ca bath. UF profile 4 Heparin 8000 units IV at start of tx Heparin 3000 units IV mid run Mircera 75 mcg IV q 2 weeks (last dose 09/15/15 Has HGB 10.0 09/15/15) Venofer 50 mg IV weekly (last dose 09*131/17 Fe 75 Tsat 29 08/18/15) Calcitriol 0.25 mcg PO q treatment (last PTH 424 08/18/15)  Ca 8.7 C Ca 8.9 Phos 7.6 (08/18/15) Takes Phoslo 667 mg 3 capsules PO TID AC and 2 capsules with snacks.  Assessment/Plan: 1. NSTEMI/Severe 2 vessel disease EF 60 - 65%  For CABG Tuesday. Onheparin/asa, BB and statin 2. ESRD-MWF - next HD today 9/25 in the afternoon -K 3.8 9/24  3. Anemia- HGB 9.3 9/24 -Rec'd Aranesp 40 mcg IV 09/20- needs larger dose increase to 100 starting this week; transfuse prn after surgery 4. Secondary hyperparathyroidism- P 6.7/bindersVDRA. -follow labs- not clear why P still high -  5.HTN/volume-  net UF 4 L on Friday with post wt 117.3 Vol ok.  3-4 kg up today, UF to dry wt Mon 9/25 6. Nutrition- Albumin 2.8. PCM in the setting of obesity. Renal/Carb mod diet. Nepro/renal vit 7. DM: per primary. BS 200s 8. Hypothyroidism: per primary   Bufford Buttner MD [facility] pager [phone]   cell [phone] 09/27/2015, 9:12 AM   Subjective:   No c/o this AM.  Objective Vitals:   09/26/15 0900 09/26/15 1350 09/26/15 2033 09/27/15 0458  BP:  100/61 122/73 110/69  Pulse:  71 68 65  Resp:  18 20 20   Temp:  97.8 F (36.6 C) 97.7 F (36.5 C) 97.8 F (36.6 C)  TempSrc:  Oral Oral Oral  SpO2:  96% 96% 99%  Weight: 121.1 kg (266 lb 15.6 oz)     Height:       Physical Exam General: NAD Heart: RRR Lungs: no rales Abdomen: soft NT Extremities: no edema Dialysis Access:  Left AVF + bruit   Additional Objective Labs: Basic Metabolic Panel:  Recent Labs Lab 09/25/15 0544 09/26/15 0221 09/27/15 0433  NA 132* 132* 129*  K 3.6 3.8 4.0  CL 96* 94* 93*  CO2 22 24  21*  GLUCOSE 189* 245* 258*  BUN 32* 50* 73*  CREATININE 5.63* 7.79* 9.04*  CALCIUM 8.7* 8.7* 8.7*  PHOS 6.1* 6.7* 7.6*   Liver Function Tests:  Recent Labs Lab 09/20/15 1202  09/25/15 0544 09/26/15 0221 09/27/15 0433  AST 22  --   --   --   --   ALT 15*  --   --   --   --   ALKPHOS 100  --   --   --   --   BILITOT 0.5  --   --   --   --   PROT 6.5  --   --   --   --   ALBUMIN 3.1*  < > 3.0* 3.0* 3.2*  < > = values in this interval not displayed. No results for input(s): LIPASE, AMYLASE in the last 168 hours. CBC:  Recent Labs Lab 09/23/15 0301 09/24/15 0351 09/25/15 0743 09/26/15 0221 09/27/15 0433  WBC 7.2 7.2 7.2 7.0 6.7  HGB 9.5* 8.9* 9.8* 9.3* 9.3*  HCT 29.7* 28.1* 31.1* 28.9* 28.4*  MCV 87.9 87.0 88.4 87.0 86.9  PLT 286 271 266 284 271   Blood Culture No results found for: SDES, SPECREQUEST, CULT, REPTSTATUS  Cardiac Enzymes:  Recent Labs Lab 09/20/15 1542 09/20/15 1855 09/20/15  2201 09/21/15 0027  TROPONINI 1.71* 2.10* 1.83* 1.68*   CBG:  Recent Labs Lab 09/26/15 0626 09/26/15 1109 09/26/15 1633 09/26/15 2137 09/27/15 0614  GLUCAP 220* 318* 220* 318* 228*   Iron Studies: No results for input(s): IRON, TIBC, TRANSFERRIN, FERRITIN in the last 72 hours. Lab Results  Component Value Date   INR 1.01 09/27/2015   INR 0.97 09/26/2015   INR 1.06 09/25/2015   Studies/Results: No results found. Medications: . heparin 1,800 Units/hr (09/26/15 [id number])   . amLODipine  10 mg Oral Daily  . aspirin EC  81 mg Oral Once  . atorvastatin  80 mg Oral q1800  . calcitRIOL  0.25 mcg Oral Q M,W,F-HD  . calcium acetate  2,001 mg Oral TID WC  . citalopram  60 mg Oral QHS  . clonazePAM  2 mg Oral QHS  . [START ON 09/29/2015] darbepoetin (ARANESP) injection - DIALYSIS  100 mcg Intravenous Q Wed-HD  . feeding supplement (NEPRO CARB STEADY)  237 mL Oral BID BM  . fluticasone  1 spray Each Nare Daily  . gabapentin  100 mg Oral TID  . heparin  8,000 Units  Dialysis Once in dialysis  . insulin aspart  0-5 Units Subcutaneous QHS  . insulin aspart  0-9 Units Subcutaneous TID WC  . insulin glargine  30 Units Subcutaneous BID  . isosorbide mononitrate  60 mg Oral Daily  . levothyroxine  50 mcg Oral QAC breakfast  . metoprolol tartrate  100 mg Oral BID  . multivitamin  1 tablet Oral QHS  . omega-3 acid ethyl esters  2 g Oral BID

## 2015-09-27 NOTE — Progress Notes (Signed)
ANTICOAGULATION CONSULT NOTE  Pharmacy Consult:  Heparin Indication: chest pain/ACS  Allergies  Allergen Reactions  . Ibuprofen Other (See Comments)    MD told him not to take due to kidney disease.  . Nsaids Other (See Comments)    Told to avoid all nsaids due to kidney disease-takes acetaminophen as needed    Patient Measurements: Height: 6\' 2"  (188 cm) Weight: 266 lb 15.6 oz (121.1 kg) IBW/kg (Calculated) : 82.2 Heparin Dosing Weight: 108 kg   Vital Signs: Temp: 97.8 F (36.6 C) (09/25 0458) Temp Source: Oral (09/25 0458) BP: 110/69 (09/25 0458) Pulse Rate: 65 (09/25 0458)  Labs:  Recent Labs  09/25/15 0544  09/25/15 0743 09/26/15 0221 09/26/15 1446 09/27/15 0433  HGB  --   < > 9.8* 9.3*  --  9.3*  HCT  --   --  31.1* 28.9*  --  28.4*  PLT  --   --  266 284  --  271  LABPROT 13.8  --   --  12.9  --  13.3  INR 1.06  --   --  0.97  --  1.01  HEPARINUNFRC 0.39  --   --  0.29* 0.46 0.52  CREATININE 5.63*  --   --  7.79*  --  9.04*  < > = values in this interval not displayed.  Estimated Creatinine Clearance: 14.4 mL/min (by C-G formula based on SCr of 9.04 mg/dL (H)).  Assessment: 45 y.o. male with chest pain to continue on IV heparin post cardiac catheterization.  Plans for CABG 9/26.  Patient's heparin level remains therapeutic at 0.52 units/mL at infusion rate of 1800 units/hr. Hemoglobin is low/stable, platelet count within normal limits, and no bleeding noted.   Goal of Therapy:  Heparin level 0.3-0.7 units/ml Monitor platelets by anticoagulation protocol: Yes   Plan:  - Continue heparin 1800 units/hr - Daily heparin level and CBC - Monitor for signs/symptoms of bleeding - CABG planned for 9/26  Ruben Imony Zahniya Zellars, PharmD Clinical Pharmacist Pager: [phone] 09/27/2015 7:34 AM

## 2015-09-27 NOTE — Progress Notes (Signed)
Principal Problem:   NSTEMI (non-ST elevated myocardial infarction) (HCC) Active Problems:   Coronary artery disease involving native coronary artery of native heart with unstable angina pectoris (HCC)   Essential hypertension, benign   DKA (diabetic ketoacidoses) (HCC)   Hyperglycemia due to type 2 diabetes mellitus (HCC)   ESRD on dialysis (HCC)   Uncontrolled diabetes mellitus type 2 with peripheral artery disease (HCC)   SUBJECTIVE:  Denies chest pain or SOB.  Is walking around the room without significant symptoms, but no walking in the hall yet.  OBJECTIVE:   Vitals:   Vitals:   09/26/15 0900 09/26/15 1350 09/26/15 2033 09/27/15 0458  BP:  100/61 122/73 110/69  Pulse:  71 68 65  Resp:  18 20 20   Temp:  97.8 F (36.6 C) 97.7 F (36.5 C) 97.8 F (36.6 C)  TempSrc:  Oral Oral Oral  SpO2:  96% 96% 99%  Weight: 121.1 kg (266 lb 15.6 oz)     Height:       I&O's:    Intake/Output Summary (Last 24 hours) at 09/27/15 1106 Last data filed at 09/26/15 1709  Gross per 24 hour  Intake              320 ml  Output                0 ml  Net              320 ml   TELEMETRY: Reviewed telemetry pt in NSR:  PHYSICAL EXAM General: Well developed, well nourished, in no acute distress Head: Eyes PERRLA, No xanthomas.   Normal cephalic and atramatic  Lungs:   CTAB,  Clear to percussion. Nonlabored Heart:   HRRR S1 S2 Pulses are 2+ & equal. Abdomen: Bowel sounds are positive, abdomen soft and non-tender without masses; obese Msk:  Back normal, normal gait. Normal strength and tone for age. Extremities:   No clubbing, cyanosis or edema.  DP +1 Neuro: Alert and oriented X 3. Psych:  Good affect, responds appropriately   LABS: Basic Metabolic Panel:  Recent Labs  16/10/96 0221 09/27/15 0433  NA 132* 129*  K 3.8 4.0  CL 94* 93*  CO2 24 21*  GLUCOSE 245* 258*  BUN 50* 73*  CREATININE 7.79* 9.04*  CALCIUM 8.7* 8.7*  PHOS 6.7* 7.6*   Liver Function Tests:  Recent  Labs  09/26/15 0221 09/27/15 0433  ALBUMIN 3.0* 3.2*   No results for input(s): LIPASE, AMYLASE in the last 72 hours. CBC:  Recent Labs  09/26/15 0221 09/27/15 0433  WBC 7.0 6.7  HGB 9.3* 9.3*  HCT 28.9* 28.4*  MCV 87.0 86.9  PLT 284 271   Cardiac Enzymes: No results for input(s): CKTOTAL, CKMB, CKMBINDEX, TROPONINI in the last 72 hours. BNP: Invalid input(s): POCBNP D-Dimer: No results for input(s): DDIMER in the last 72 hours. Hemoglobin A1C: No results for input(s): HGBA1C in the last 72 hours. Fasting Lipid Panel: No results for input(s): CHOL, HDL, LDLCALC, TRIG, CHOLHDL, LDLDIRECT in the last 72 hours. Thyroid Function Tests: No results for input(s): TSH, T4TOTAL, T3FREE, THYROIDAB in the last 72 hours.  Invalid input(s): FREET3 Anemia Panel: No results for input(s): VITAMINB12, FOLATE, FERRITIN, TIBC, IRON, RETICCTPCT in the last 72 hours. Coag Panel:   Lab Results  Component Value Date   INR 1.01 09/27/2015   INR 0.97 09/26/2015   INR 1.06 09/25/2015    RADIOLOGY: Dg Chest 2 View  Result Date: 09/20/2015 CLINICAL DATA:  Chest pain and shortness of breath. EXAM: CHEST  2 VIEW COMPARISON:  07/19/2015 FINDINGS: The heart size and mediastinal contours are within normal limits. Both lungs are clear. The visualized skeletal structures are unremarkable. IMPRESSION: No active cardiopulmonary disease. Electronically Signed   By: Kennith CenterEric  Mansell M.D.   On: 09/20/2015 13:23    Patient Profile  45 yo male w/ hx IDDM, ESRD on HD, HTN, GERD, anemia, depression, hypothyroid, neuropathy. Admitted 09/18 w/ DKA and chest pain/NSTEMI. S/p cath w/ 2 v dz, TCTS has seen, CABG Planned for Tuesday 9/26.   Assessment & Plan   Overall doing well without active chest pain on current management. We will continue current level management with plan for CABG tomorrow   1. NSTEMI: Peak troponin 2.10. Echo with moderate LVH with severe hypertrophy of the posteriorwall. EF60% to  65% - cardiac cath w/ severe 2v CAD, Currently chest pain-free  TCTS has seen, for CABG on Tuesday 9/26  continue heparin, nitrates, ASA, BB and high-dose statin.  2. HTN: improved with the increase of amlodipine to 10mg . currently well-controlled  3. DM: management per Family Medicine Service.  4. Hyperlipidemia with an atherogenic panel with increased TG, low HDL increased VLDL;  - with DM needs aggressive intervention;   on atorvastatin 80  added lovaza.  Consider fenofibrate Rx with increased TG.   5. ESRD on dialysis;meds held prior to dialysis to avoid hypotension, per IM/Nephrology  6. Anemia of CKD today 9.3, per IM  7. Hypothyroidismon synthroid replacement. Per IM   Bryan Lemmaavid Harding, MD  09/27/2015  11:06 AM

## 2015-09-27 NOTE — Progress Notes (Signed)
CARDIAC REHAB PHASE I   Pt ambulated independently this morning, napping at this time. Pt for dialysis this afternoon. Pt declines additional ambulation at this time. Will follow post-op.   Joylene GrapesEmily C Faelynn Wynder, RN, BSN 09/27/2015 10:39 AM

## 2015-09-27 NOTE — Progress Notes (Signed)
Patient ID: Russell [name], male   DOB: Dec 23, 1970, 45 y.o.   MRN: [id number]      [address]       [place] [id number]             [phone]                 5 Days Post-Op Procedure(s) (LRB): Left Heart Cath and Coronary Angiography (N/A)  LOS: 7 days   Subjective: No chest pain, dialysis today 5.5 l removed   Objective: Vital signs in last 24 hours: Patient Vitals for the past 24 hrs:  BP Temp Temp src Pulse Resp SpO2 Height Weight  09/27/15 1710 (!) 150/91 - - 73 - - - -  09/27/15 1705 (!) 161/85 97.8 F (36.6 C) - 73 16 - 6\' 2"  (1.88 m) 260 lb 9.3 oz (118.2 kg)  09/27/15 1630 (!) 151/80 - - 71 - - - -  09/27/15 1600 (!) 149/73 - - 70 - - - -  09/27/15 1530 139/76 - - 71 - - - -  09/27/15 1500 (!) 145/80 - - 72 - - - -  09/27/15 1430 140/72 - - 70 14 - - -  09/27/15 1400 (!) 151/74 - - 73 15 - - -  09/27/15 1330 139/66 - - 66 18 - - -  09/27/15 1310 (!) 152/87 - - 68 18 - - -  09/27/15 1305 (!) 152/86 97.6 F (36.4 C) Oral 69 18 97 % - 272 lb 11.3 oz (123.7 kg)  09/27/15 0458 110/69 97.8 F (36.6 C) Oral 65 20 99 % - -  09/26/15 2033 122/73 97.7 F (36.5 C) Oral 68 20 96 % - -    Filed Weights   09/26/15 0900 09/27/15 1305 09/27/15 1705  Weight: 266 lb 15.6 oz (121.1 kg) 272 lb 11.3 oz (123.7 kg) 260 lb 9.3 oz (118.2 kg)    Hemodynamic parameters for last 24 hours:    Intake/Output from previous day: 09/24 0701 - 09/25 0700 In: 520 [P.O.:520] Out: -  Intake/Output this shift: No intake/output data recorded.  Scheduled Meds: . [START ON 09/28/2015] aminocaproic acid (AMICAR) for OHS   Intravenous To OR  . amLODipine  10 mg Oral Daily  . aspirin EC  81 mg Oral Once  . atorvastatin  80 mg Oral q1800  . calcitRIOL  0.25 mcg Oral Q M,W,F-HD  . calcium acetate  2,001 mg Oral TID WC  . [START ON 09/28/2015] cefUROXime (ZINACEF)  IV  1.5 g Intravenous To OR  . [START ON 09/28/2015] cefUROXime (ZINACEF)  IV  750 mg Intravenous To OR  .  Chlorhexidine Gluconate Cloth  6 each Topical Daily  . citalopram  40 mg Oral QHS  . clonazePAM  2 mg Oral QHS  . [START ON 09/29/2015] darbepoetin (ARANESP) injection - DIALYSIS  100 mcg Intravenous Q Wed-HD  . [START ON 09/28/2015] dexmedetomidine  0.1-0.7 mcg/kg/hr Intravenous To OR  . [START ON 09/28/2015] DOPamine  0-10 mcg/kg/min Intravenous To OR  . [START ON 09/28/2015] epinephrine  0-10 mcg/min Intravenous To OR  . feeding supplement (NEPRO CARB STEADY)  237 mL Oral BID BM  . fluticasone  1 spray Each Nare Daily  . gabapentin  100 mg Oral TID  . [START ON 09/28/2015] heparin-papaverine-plasmalyte irrigation   Irrigation To OR  . [START ON 09/28/2015] heparin 30,000 units/NS 1000 mL solution for CELLSAVER   Other To OR  . heparin  8,000 Units Dialysis Once  in dialysis  . insulin aspart  0-5 Units Subcutaneous QHS  . insulin aspart  0-9 Units Subcutaneous TID WC  . insulin glargine  20 Units Subcutaneous BID  . [START ON 09/28/2015] insulin (NOVOLIN-R) infusion   Intravenous To OR  . isosorbide mononitrate  60 mg Oral Daily  . levothyroxine  50 mcg Oral QAC breakfast  . [START ON 09/28/2015] magnesium sulfate  40 mEq Other To OR  . metoprolol tartrate  100 mg Oral BID  . multivitamin  1 tablet Oral QHS  . mupirocin ointment  1 application Nasal BID  . [START ON 09/28/2015] nitroGLYCERIN  2-200 mcg/min Intravenous To OR  . omega-3 acid ethyl esters  2 g Oral BID  . [START ON 09/28/2015] phenylephrine (NEO-SYNEPHRINE) Adult infusion  30-200 mcg/min Intravenous To OR  . [START ON 09/28/2015] potassium chloride  80 mEq Other To OR  . [START ON 09/28/2015] vancomycin  1,500 mg Intravenous To OR   Continuous Infusions: . heparin 1,800 Units/hr (09/26/15 2058)   PRN Meds:.sodium chloride, sodium chloride, acetaminophen, hydrALAZINE, metoCLOPramide, MUSCLE RUB, traMADol  General appearance: alert and cooperative Neurologic: intact Heart: regular rate and rhythm, S1, S2 normal, no murmur,  click, rub or gallop Lungs: clear to auscultation bilaterally Abdomen: soft, non-tender; bowel sounds normal; no masses,  no organomegaly Extremities: extremities normal, atraumatic, no cyanosis or edema and Homans sign is negative, no sign of DVT  Lab Results: CBC:  Recent Labs  09/26/15 0221 09/27/15 0433  WBC 7.0 6.7  HGB 9.3* 9.3*  HCT 28.9* 28.4*  PLT 284 271   BMET:   Recent Labs  09/27/15 0433 09/27/15 1144  NA 129* 129*  K 4.0 5.2*  CL 93* 94*  CO2 21* 20*  GLUCOSE 258* 213*  BUN 73* 77*  CREATININE 9.04* 9.06*  CALCIUM 8.7* 8.7*    PT/INR:   Recent Labs  09/27/15 0433  LABPROT 13.3  INR 1.01     Radiology Dg Chest 2 View  Result Date: 09/20/2015 CLINICAL DATA:  Chest pain and shortness of breath. EXAM: CHEST  2 VIEW COMPARISON:  07/19/2015 FINDINGS: The heart size and mediastinal contours are within normal limits. Both lungs are clear. The visualized skeletal structures are unremarkable. IMPRESSION: No active cardiopulmonary disease. Electronically Signed   By: Kennith CenterEric  Mansell M.D.   On: 09/20/2015 13:23    Assessment/Plan: S/P Procedure(s) (LRB): Left Heart Cath and Coronary Angiography (N/A) Plan cabg in am  The goals risks and alternatives of the planned surgical procedure CABG  have been discussed with the patient in detail. The risks of the procedure including death, infection, stroke, myocardial infarction, bleeding, blood transfusion have all been discussed specifically.  I have quoted Russell [name] a 4 % of perioperative mortality and a complication rate as high as 40 %. The patient's questions have been answered.Russell [name] is willing  to proceed with the planned procedure.   Delight OvensEdward B Gergory Biello MD 09/27/2015 7:34 PM

## 2015-09-28 ENCOUNTER — Encounter (HOSPITAL_COMMUNITY)
Admission: EM | Disposition: A | Discharge status: Home Health | Payer: Self-pay | Source: Home / Self Care | Attending: Family Medicine

## 2015-09-28 ENCOUNTER — Inpatient Hospital Stay (HOSPITAL_COMMUNITY): Payer: Medicare Other

## 2015-09-28 ENCOUNTER — Inpatient Hospital Stay (HOSPITAL_COMMUNITY): Payer: Medicare Other | Admitting: Anesthesiology

## 2015-09-28 ENCOUNTER — Other Ambulatory Visit (HOSPITAL_COMMUNITY): Payer: Self-pay

## 2015-09-28 DIAGNOSIS — Z951 Presence of aortocoronary bypass graft: Secondary | ICD-10-CM

## 2015-09-28 HISTORY — PX: TEE WITHOUT CARDIOVERSION: SHX5443

## 2015-09-28 HISTORY — PX: ENDOVEIN HARVEST OF GREATER SAPHENOUS VEIN: SHX5059

## 2015-09-28 HISTORY — PX: CORONARY ARTERY BYPASS GRAFT: SHX141

## 2015-09-28 LAB — POCT I-STAT 3, ART BLOOD GAS (G3+)
ACID-BASE DEFICIT: 4 mmol/L — AB (ref 0.0–2.0)
ACID-BASE DEFICIT: 4 mmol/L — AB (ref 0.0–2.0)
ACID-BASE DEFICIT: 5 mmol/L — AB (ref 0.0–2.0)
Acid-Base Excess: 1 mmol/L (ref 0.0–2.0)
Acid-base deficit: 1 mmol/L (ref 0.0–2.0)
Acid-base deficit: 3 mmol/L — ABNORMAL HIGH (ref 0.0–2.0)
BICARBONATE: 23.1 mmol/L (ref 20.0–28.0)
BICARBONATE: 23.7 mmol/L (ref 20.0–28.0)
BICARBONATE: 21.4 mmol/L (ref 20.0–28.0)
Bicarbonate: 25.7 mmol/L (ref 20.0–28.0)
Bicarbonate: 25.5 mmol/L (ref 20.0–28.0)
Bicarbonate: 23.1 mmol/L (ref 20.0–28.0)
O2 SAT: 88 %
O2 SAT: 88 %
O2 Saturation: 100 %
O2 Saturation: 94 %
O2 Saturation: 92 %
O2 Saturation: 91 %
PCO2 ART: 49.1 mmHg — AB (ref 32.0–48.0)
PCO2 ART: 47.1 mmHg (ref 32.0–48.0)
PH ART: 7.428 (ref 7.350–7.450)
PH ART: 7.32 — AB (ref 7.350–7.450)
PH ART: 7.291 — AB (ref 7.350–7.450)
PO2 ART: 73 mmHg — AB (ref 83.0–108.0)
PO2 ART: 59 mmHg — AB (ref 83.0–108.0)
Patient temperature: 36.2
Patient temperature: 37
TCO2: 27 mmol/L (ref 0–100)
TCO2: 27 mmol/L (ref 0–100)
TCO2: 25 mmol/L (ref 0–100)
TCO2: 24 mmol/L (ref 0–100)
TCO2: 25 mmol/L (ref 0–100)
TCO2: 23 mmol/L (ref 0–100)
pCO2 arterial: 39 mmHg (ref 32.0–48.0)
pCO2 arterial: 47.7 mmHg (ref 32.0–48.0)
pCO2 arterial: 53.7 mmHg — ABNORMAL HIGH (ref 32.0–48.0)
pCO2 arterial: 41.8 mmHg (ref 32.0–48.0)
pH, Arterial: 7.298 — ABNORMAL LOW (ref 7.350–7.450)
pH, Arterial: 7.255 — ABNORMAL LOW (ref 7.350–7.450)
pH, Arterial: 7.317 — ABNORMAL LOW (ref 7.350–7.450)
pO2, Arterial: 339 mmHg — ABNORMAL HIGH (ref 83.0–108.0)
pO2, Arterial: 70 mmHg — ABNORMAL LOW (ref 83.0–108.0)
pO2, Arterial: 69 mmHg — ABNORMAL LOW (ref 83.0–108.0)
pO2, Arterial: 65 mmHg — ABNORMAL LOW (ref 83.0–108.0)

## 2015-09-28 LAB — POCT I-STAT, CHEM 8
BUN: 41 mg/dL — ABNORMAL HIGH (ref 6–20)
BUN: 36 mg/dL — ABNORMAL HIGH (ref 6–20)
BUN: 40 mg/dL — ABNORMAL HIGH (ref 6–20)
BUN: 39 mg/dL — ABNORMAL HIGH (ref 6–20)
BUN: 38 mg/dL — AB (ref 6–20)
BUN: 45 mg/dL — AB (ref 6–20)
CALCIUM ION: 1.07 mmol/L — AB (ref 1.15–1.40)
CALCIUM ION: 0.97 mmol/L — AB (ref 1.15–1.40)
CALCIUM ION: 1.19 mmol/L (ref 1.15–1.40)
CHLORIDE: 96 mmol/L — AB (ref 101–111)
CHLORIDE: 99 mmol/L — AB (ref 101–111)
CREATININE: 6.5 mg/dL — AB (ref 0.61–1.24)
Calcium, Ion: 1.11 mmol/L — ABNORMAL LOW (ref 1.15–1.40)
Calcium, Ion: 0.94 mmol/L — ABNORMAL LOW (ref 1.15–1.40)
Calcium, Ion: 1.12 mmol/L — ABNORMAL LOW (ref 1.15–1.40)
Chloride: 97 mmol/L — ABNORMAL LOW (ref 101–111)
Chloride: 96 mmol/L — ABNORMAL LOW (ref 101–111)
Chloride: 99 mmol/L — ABNORMAL LOW (ref 101–111)
Chloride: 96 mmol/L — ABNORMAL LOW (ref 101–111)
Creatinine, Ser: 6.3 mg/dL — ABNORMAL HIGH (ref 0.61–1.24)
Creatinine, Ser: 6.6 mg/dL — ABNORMAL HIGH (ref 0.61–1.24)
Creatinine, Ser: 6.2 mg/dL — ABNORMAL HIGH (ref 0.61–1.24)
Creatinine, Ser: 6.6 mg/dL — ABNORMAL HIGH (ref 0.61–1.24)
Creatinine, Ser: 7.1 mg/dL — ABNORMAL HIGH (ref 0.61–1.24)
GLUCOSE: 157 mg/dL — AB (ref 65–99)
GLUCOSE: 157 mg/dL — AB (ref 65–99)
Glucose, Bld: 140 mg/dL — ABNORMAL HIGH (ref 65–99)
Glucose, Bld: 159 mg/dL — ABNORMAL HIGH (ref 65–99)
Glucose, Bld: 267 mg/dL — ABNORMAL HIGH (ref 65–99)
Glucose, Bld: 110 mg/dL — ABNORMAL HIGH (ref 65–99)
HCT: 24 % — ABNORMAL LOW (ref 39.0–52.0)
HCT: 24 % — ABNORMAL LOW (ref 39.0–52.0)
HEMATOCRIT: 31 % — AB (ref 39.0–52.0)
HEMATOCRIT: 24 % — AB (ref 39.0–52.0)
HEMATOCRIT: 27 % — AB (ref 39.0–52.0)
HEMATOCRIT: 32 % — AB (ref 39.0–52.0)
HEMOGLOBIN: 10.5 g/dL — AB (ref 13.0–17.0)
HEMOGLOBIN: 8.2 g/dL — AB (ref 13.0–17.0)
HEMOGLOBIN: 8.2 g/dL — AB (ref 13.0–17.0)
HEMOGLOBIN: 10.9 g/dL — AB (ref 13.0–17.0)
Hemoglobin: 8.2 g/dL — ABNORMAL LOW (ref 13.0–17.0)
Hemoglobin: 9.2 g/dL — ABNORMAL LOW (ref 13.0–17.0)
POTASSIUM: 4.3 mmol/L (ref 3.5–5.1)
POTASSIUM: 4.6 mmol/L (ref 3.5–5.1)
Potassium: 5 mmol/L (ref 3.5–5.1)
Potassium: 4.6 mmol/L (ref 3.5–5.1)
Potassium: 4.1 mmol/L (ref 3.5–5.1)
Potassium: 4.8 mmol/L (ref 3.5–5.1)
SODIUM: 132 mmol/L — AB (ref 135–145)
SODIUM: 133 mmol/L — AB (ref 135–145)
SODIUM: 135 mmol/L (ref 135–145)
Sodium: 135 mmol/L (ref 135–145)
Sodium: 134 mmol/L — ABNORMAL LOW (ref 135–145)
Sodium: 135 mmol/L (ref 135–145)
TCO2: 28 mmol/L (ref 0–100)
TCO2: 26 mmol/L (ref 0–100)
TCO2: 27 mmol/L (ref 0–100)
TCO2: 26 mmol/L (ref 0–100)
TCO2: 24 mmol/L (ref 0–100)
TCO2: 25 mmol/L (ref 0–100)

## 2015-09-28 LAB — PROTIME-INR
INR: 1.06
INR: 1.33
PROTHROMBIN TIME: 13.9 s (ref 11.4–15.2)
PROTHROMBIN TIME: 16.6 s — AB (ref 11.4–15.2)

## 2015-09-28 LAB — CBC
HCT: 30.2 % — ABNORMAL LOW (ref 39.0–52.0)
HCT: 32.8 % — ABNORMAL LOW (ref 39.0–52.0)
HCT: 32.4 % — ABNORMAL LOW (ref 39.0–52.0)
HEMOGLOBIN: 10.2 g/dL — AB (ref 13.0–17.0)
Hemoglobin: 9.5 g/dL — ABNORMAL LOW (ref 13.0–17.0)
Hemoglobin: 10.5 g/dL — ABNORMAL LOW (ref 13.0–17.0)
MCH: 27.6 pg (ref 26.0–34.0)
MCH: 27.3 pg (ref 26.0–34.0)
MCH: 28.1 pg (ref 26.0–34.0)
MCHC: 31.5 g/dL (ref 30.0–36.0)
MCHC: 31.1 g/dL (ref 30.0–36.0)
MCHC: 32.4 g/dL (ref 30.0–36.0)
MCV: 87.8 fL (ref 78.0–100.0)
MCV: 87.7 fL (ref 78.0–100.0)
MCV: 86.6 fL (ref 78.0–100.0)
PLATELETS: 279 10*3/uL (ref 150–400)
Platelets: 224 10*3/uL (ref 150–400)
Platelets: 230 10*3/uL (ref 150–400)
RBC: 3.44 MIL/uL — AB (ref 4.22–5.81)
RBC: 3.74 MIL/uL — ABNORMAL LOW (ref 4.22–5.81)
RBC: 3.74 MIL/uL — ABNORMAL LOW (ref 4.22–5.81)
RDW: 16.2 % — ABNORMAL HIGH (ref 11.5–15.5)
RDW: 15.9 % — AB (ref 11.5–15.5)
RDW: 15.9 % — ABNORMAL HIGH (ref 11.5–15.5)
WBC: 7.9 10*3/uL (ref 4.0–10.5)
WBC: 13.4 10*3/uL — ABNORMAL HIGH (ref 4.0–10.5)
WBC: 14.3 10*3/uL — ABNORMAL HIGH (ref 4.0–10.5)

## 2015-09-28 LAB — GLUCOSE, CAPILLARY
GLUCOSE-CAPILLARY: 111 mg/dL — AB (ref 65–99)
GLUCOSE-CAPILLARY: 154 mg/dL — AB (ref 65–99)
GLUCOSE-CAPILLARY: 126 mg/dL — AB (ref 65–99)
GLUCOSE-CAPILLARY: 107 mg/dL — AB (ref 65–99)
GLUCOSE-CAPILLARY: 90 mg/dL (ref 65–99)
Glucose-Capillary: 92 mg/dL (ref 65–99)
Glucose-Capillary: 119 mg/dL — ABNORMAL HIGH (ref 65–99)
Glucose-Capillary: 128 mg/dL — ABNORMAL HIGH (ref 65–99)
Glucose-Capillary: 123 mg/dL — ABNORMAL HIGH (ref 65–99)

## 2015-09-28 LAB — POCT I-STAT 4, (NA,K, GLUC, HGB,HCT)
Glucose, Bld: 200 mg/dL — ABNORMAL HIGH (ref 65–99)
HCT: 29 % — ABNORMAL LOW (ref 39.0–52.0)
Hemoglobin: 9.9 g/dL — ABNORMAL LOW (ref 13.0–17.0)
Potassium: 3.6 mmol/L (ref 3.5–5.1)
Sodium: 135 mmol/L (ref 135–145)

## 2015-09-28 LAB — CREATININE, SERUM
Creatinine, Ser: 7.03 mg/dL — ABNORMAL HIGH (ref 0.61–1.24)
GFR calc Af Amer: 10 mL/min — ABNORMAL LOW (ref >60)
GFR calc non Af Amer: 9 mL/min — ABNORMAL LOW (ref >60)

## 2015-09-28 LAB — APTT: aPTT: 32 seconds (ref 24–36)

## 2015-09-28 LAB — BASIC METABOLIC PANEL
Anion gap: 12 (ref 5–15)
BUN: 39 mg/dL — ABNORMAL HIGH (ref 6–20)
CO2: 26 mmol/L (ref 22–32)
Calcium: 8.5 mg/dL — ABNORMAL LOW (ref 8.9–10.3)
Chloride: 97 mmol/L — ABNORMAL LOW (ref 101–111)
Creatinine, Ser: 6.32 mg/dL — ABNORMAL HIGH (ref 0.61–1.24)
GFR calc Af Amer: 11 mL/min — ABNORMAL LOW (ref >60)
GFR calc non Af Amer: 10 mL/min — ABNORMAL LOW (ref >60)
Glucose, Bld: 135 mg/dL — ABNORMAL HIGH (ref 65–99)
Potassium: 3.8 mmol/L (ref 3.5–5.1)
Sodium: 135 mmol/L (ref 135–145)

## 2015-09-28 LAB — ABO/RH: ABO/RH(D): B NEG

## 2015-09-28 LAB — PLATELET COUNT: Platelets: 242 10*3/uL (ref 150–400)

## 2015-09-28 LAB — PREPARE RBC (CROSSMATCH)

## 2015-09-28 LAB — HEMOGLOBIN AND HEMATOCRIT, BLOOD
HCT: 23.5 % — ABNORMAL LOW (ref 39.0–52.0)
Hemoglobin: 7.4 g/dL — ABNORMAL LOW (ref 13.0–17.0)

## 2015-09-28 LAB — MAGNESIUM: Magnesium: 2 mg/dL (ref 1.7–2.4)

## 2015-09-28 SURGERY — CORONARY ARTERY BYPASS GRAFTING (CABG)
Anesthesia: General | Site: Leg Upper

## 2015-09-28 MED ORDER — FENTANYL CITRATE (PF) 250 MCG/5ML IJ SOLN
INTRAMUSCULAR | Status: AC
  Filled 2015-09-28: qty 20

## 2015-09-28 MED ORDER — FENTANYL CITRATE (PF) 250 MCG/5ML IJ SOLN
INTRAMUSCULAR | Status: AC
  Filled 2015-09-28: qty 5

## 2015-09-28 MED ORDER — LACTATED RINGERS IV SOLN: 500.0000 mL | Freq: Once | INTRAVENOUS | Status: DC | PRN

## 2015-09-28 MED ORDER — PROPOFOL 10 MG/ML IV BOLUS
INTRAVENOUS | Status: AC
  Filled 2015-09-28: qty 20

## 2015-09-28 MED ORDER — LIDOCAINE 2% (20 MG/ML) 5 ML SYRINGE
INTRAMUSCULAR | Status: AC
  Filled 2015-09-28: qty 5

## 2015-09-28 MED ORDER — HEMOSTATIC AGENTS (NO CHARGE) OPTIME
TOPICAL | Status: DC | PRN
  Administered 2015-09-28: 1 via TOPICAL

## 2015-09-28 MED ORDER — DOCUSATE SODIUM 100 MG PO CAPS
200.0000 mg | ORAL_CAPSULE | Freq: Every day | ORAL | Status: DC
  Administered 2015-09-30 – 2015-10-03 (×4): 200 mg via ORAL
  Filled 2015-09-28 (×4): qty 2

## 2015-09-28 MED ORDER — MIDAZOLAM HCL 2 MG/2ML IJ SOLN
INTRAMUSCULAR | Status: DC | PRN
  Administered 2015-09-28: 3 mg via INTRAVENOUS
  Administered 2015-09-28 (×2): 2 mg via INTRAVENOUS

## 2015-09-28 MED ORDER — CHLORHEXIDINE GLUCONATE 0.12% ORAL RINSE (MEDLINE KIT)
15.0000 mL | Freq: Two times a day (BID) | OROMUCOSAL | Status: DC
  Administered 2015-09-28 – 2015-10-02 (×4): 15 mL via OROMUCOSAL

## 2015-09-28 MED ORDER — LIDOCAINE HCL (CARDIAC) 20 MG/ML IV SOLN
INTRAVENOUS | Status: DC | PRN
  Administered 2015-09-28: 100 mg via INTRAVENOUS

## 2015-09-28 MED ORDER — SODIUM BICARBONATE 8.4 % IV SOLN
25.0000 meq | Freq: Once | INTRAVENOUS | Status: AC
  Administered 2015-09-28: 25 meq via INTRAVENOUS

## 2015-09-28 MED ORDER — METOPROLOL TARTRATE 25 MG/10 ML ORAL SUSPENSION: 12.5000 mg | Freq: Two times a day (BID) | ORAL | Status: DC

## 2015-09-28 MED ORDER — HEPARIN SODIUM (PORCINE) 1000 UNIT/ML IJ SOLN
INTRAMUSCULAR | Status: AC
  Filled 2015-09-28: qty 1

## 2015-09-28 MED ORDER — ACETAMINOPHEN 500 MG PO TABS
1000.0000 mg | ORAL_TABLET | Freq: Four times a day (QID) | ORAL | Status: AC
  Administered 2015-09-28 – 2015-10-03 (×11): 1000 mg via ORAL
  Filled 2015-09-28 (×13): qty 2

## 2015-09-28 MED ORDER — SODIUM CHLORIDE 0.9% FLUSH
3.0000 mL | Freq: Two times a day (BID) | INTRAVENOUS | Status: DC
  Administered 2015-09-29 – 2015-10-03 (×8): 3 mL via INTRAVENOUS

## 2015-09-28 MED ORDER — DEXMEDETOMIDINE HCL IN NACL 200 MCG/50ML IV SOLN
INTRAVENOUS | Status: DC | PRN
  Administered 2015-09-28: .3 ug/kg/h via INTRAVENOUS

## 2015-09-28 MED ORDER — HEPARIN SODIUM (PORCINE) 1000 UNIT/ML IJ SOLN
INTRAMUSCULAR | Status: DC | PRN
  Administered 2015-09-28: 50000 [IU] via INTRAVENOUS
  Administered 2015-09-28: 10000 [IU] via INTRAVENOUS

## 2015-09-28 MED ORDER — SODIUM CHLORIDE 0.9 % IJ SOLN
OROMUCOSAL | Status: DC | PRN
  Administered 2015-09-28: 4 mL via TOPICAL
  Administered 2015-09-28: 8 mL via TOPICAL

## 2015-09-28 MED ORDER — ORAL CARE MOUTH RINSE
15.0000 mL | Freq: Four times a day (QID) | OROMUCOSAL | Status: DC
  Administered 2015-09-28 – 2015-10-02 (×7): 15 mL via OROMUCOSAL

## 2015-09-28 MED ORDER — ONDANSETRON HCL 4 MG/2ML IJ SOLN
4.0000 mg | Freq: Four times a day (QID) | INTRAMUSCULAR | Status: DC | PRN
  Administered 2015-09-28 – 2015-10-02 (×9): 4 mg via INTRAVENOUS
  Filled 2015-09-28 (×9): qty 2

## 2015-09-28 MED ORDER — MIDAZOLAM HCL 2 MG/2ML IJ SOLN: 2.0000 mg | INTRAMUSCULAR | Status: DC | PRN

## 2015-09-28 MED ORDER — CALCIUM CHLORIDE 10 % IV SOLN
INTRAVENOUS | Status: AC
  Filled 2015-09-28: qty 20

## 2015-09-28 MED ORDER — LEVALBUTEROL HCL 0.63 MG/3ML IN NEBU: 0.6300 mg | INHALATION_SOLUTION | Freq: Four times a day (QID) | RESPIRATORY_TRACT | Status: DC | PRN

## 2015-09-28 MED ORDER — MORPHINE SULFATE (PF) 2 MG/ML IV SOLN: 1.0000 mg | INTRAVENOUS | Status: AC | PRN

## 2015-09-28 MED ORDER — BISACODYL 5 MG PO TBEC
10.0000 mg | DELAYED_RELEASE_TABLET | Freq: Every day | ORAL | Status: DC
  Administered 2015-09-30 – 2015-10-04 (×5): 10 mg via ORAL
  Filled 2015-09-28 (×6): qty 2

## 2015-09-28 MED ORDER — ASPIRIN 81 MG PO CHEW: 324.0000 mg | CHEWABLE_TABLET | Freq: Every day | ORAL | Status: DC

## 2015-09-28 MED ORDER — PANTOPRAZOLE SODIUM 40 MG PO TBEC
40.0000 mg | DELAYED_RELEASE_TABLET | Freq: Every day | ORAL | Status: DC
  Administered 2015-09-30 – 2015-10-05 (×6): 40 mg via ORAL
  Filled 2015-09-28 (×5): qty 1

## 2015-09-28 MED ORDER — FAMOTIDINE IN NACL 20-0.9 MG/50ML-% IV SOLN
20.0000 mg | Freq: Two times a day (BID) | INTRAVENOUS | Status: AC
  Administered 2015-09-28 (×2): 20 mg via INTRAVENOUS
  Filled 2015-09-28: qty 50

## 2015-09-28 MED ORDER — INSULIN REGULAR BOLUS VIA INFUSION
0.0000 [IU] | Freq: Three times a day (TID) | INTRAVENOUS | Status: DC
  Filled 2015-09-28: qty 10

## 2015-09-28 MED ORDER — SODIUM CHLORIDE 0.9 % IV SOLN
INTRAVENOUS | Status: DC
  Administered 2015-09-29: 02:00:00 via INTRAVENOUS

## 2015-09-28 MED ORDER — ACETAMINOPHEN 160 MG/5ML PO SOLN
650.0000 mg | Freq: Once | ORAL | Status: AC
  Administered 2015-09-28: 650 mg

## 2015-09-28 MED ORDER — ASPIRIN EC 325 MG PO TBEC
325.0000 mg | DELAYED_RELEASE_TABLET | Freq: Every day | ORAL | Status: DC
  Administered 2015-09-30 – 2015-10-04 (×5): 325 mg via ORAL
  Filled 2015-09-28 (×5): qty 1

## 2015-09-28 MED ORDER — PROPOFOL 10 MG/ML IV BOLUS
INTRAVENOUS | Status: DC | PRN
  Administered 2015-09-28: 50 mg via INTRAVENOUS

## 2015-09-28 MED ORDER — OXYCODONE HCL 5 MG PO TABS: 5.0000 mg | ORAL_TABLET | ORAL | Status: DC | PRN

## 2015-09-28 MED ORDER — DEXTROSE 5 % IV SOLN
1.5000 g | Freq: Two times a day (BID) | INTRAVENOUS | Status: AC
  Administered 2015-09-28 – 2015-09-30 (×4): 1.5 g via INTRAVENOUS
  Filled 2015-09-28 (×4): qty 1.5

## 2015-09-28 MED ORDER — LACTATED RINGERS IV SOLN: INTRAVENOUS | Status: DC

## 2015-09-28 MED ORDER — MORPHINE SULFATE (PF) 2 MG/ML IV SOLN
2.0000 mg | INTRAVENOUS | Status: DC | PRN
  Administered 2015-09-29 – 2015-10-01 (×11): 2 mg via INTRAVENOUS
  Filled 2015-09-28 (×11): qty 1

## 2015-09-28 MED ORDER — METOPROLOL TARTRATE 5 MG/5ML IV SOLN
2.5000 mg | INTRAVENOUS | Status: DC | PRN
Start: 2015-09-28 — End: 2015-10-05
  Administered 2015-09-30: 2.5 mg via INTRAVENOUS
  Administered 2015-10-02: 5 mg via INTRAVENOUS
  Filled 2015-09-28 (×2): qty 5

## 2015-09-28 MED ORDER — FENTANYL CITRATE (PF) 100 MCG/2ML IJ SOLN
INTRAMUSCULAR | Status: AC
  Filled 2015-09-28: qty 2

## 2015-09-28 MED ORDER — FENTANYL CITRATE (PF) 250 MCG/5ML IJ SOLN
INTRAMUSCULAR | Status: DC | PRN
  Administered 2015-09-28 (×5): 250 ug via INTRAVENOUS

## 2015-09-28 MED ORDER — ROCURONIUM BROMIDE 100 MG/10ML IV SOLN
INTRAVENOUS | Status: DC | PRN
  Administered 2015-09-28: 40 mg via INTRAVENOUS
  Administered 2015-09-28 (×2): 50 mg via INTRAVENOUS

## 2015-09-28 MED ORDER — VANCOMYCIN HCL IN DEXTROSE 1-5 GM/200ML-% IV SOLN
1000.0000 mg | Freq: Once | INTRAVENOUS | Status: AC
  Administered 2015-09-28: 1000 mg via INTRAVENOUS
  Filled 2015-09-28: qty 200

## 2015-09-28 MED ORDER — PHENYLEPHRINE HCL 10 MG/ML IJ SOLN
0.0000 ug/min | INTRAVENOUS | Status: DC
  Filled 2015-09-28: qty 2

## 2015-09-28 MED ORDER — ACETAMINOPHEN 160 MG/5ML PO SOLN: 1000.0000 mg | Freq: Four times a day (QID) | ORAL | Status: AC

## 2015-09-28 MED ORDER — ROCURONIUM BROMIDE 10 MG/ML (PF) SYRINGE
PREFILLED_SYRINGE | INTRAVENOUS | Status: AC
  Filled 2015-09-28: qty 10

## 2015-09-28 MED ORDER — POTASSIUM CHLORIDE 10 MEQ/50ML IV SOLN: 10.0000 meq | INTRAVENOUS | Status: AC

## 2015-09-28 MED ORDER — PHENYLEPHRINE HCL 10 MG/ML IJ SOLN
INTRAVENOUS | Status: DC | PRN
  Administered 2015-09-28: 50 ug/min via INTRAVENOUS

## 2015-09-28 MED ORDER — SODIUM CHLORIDE 0.45 % IV SOLN
INTRAVENOUS | Status: DC | PRN
  Administered 2015-09-28: 14:00:00 via INTRAVENOUS

## 2015-09-28 MED ORDER — DEXMEDETOMIDINE HCL IN NACL 400 MCG/100ML IV SOLN
0.0000 ug/kg/h | INTRAVENOUS | Status: DC
  Administered 2015-09-28: 0.7 ug/kg/h via INTRAVENOUS
  Filled 2015-09-28 (×3): qty 50
  Filled 2015-09-28 (×2): qty 100

## 2015-09-28 MED ORDER — MIDAZOLAM HCL 2 MG/2ML IJ SOLN
INTRAMUSCULAR | Status: AC
  Filled 2015-09-28: qty 2

## 2015-09-28 MED ORDER — FENTANYL CITRATE (PF) 100 MCG/2ML IJ SOLN
INTRAMUSCULAR | Status: DC | PRN
  Administered 2015-09-28 (×2): 50 ug via INTRAVENOUS

## 2015-09-28 MED ORDER — MAGNESIUM SULFATE 4 GM/100ML IV SOLN: 4.0000 g | Freq: Once | INTRAVENOUS | Status: DC

## 2015-09-28 MED ORDER — DEXTROSE 5 % IV SOLN
INTRAVENOUS | Status: DC | PRN
  Administered 2015-09-28: .75 g via INTRAVENOUS
  Administered 2015-09-28: 1.5 g via INTRAVENOUS

## 2015-09-28 MED ORDER — ACETAMINOPHEN 650 MG RE SUPP
650.0000 mg | Freq: Once | RECTAL | Status: AC
  Administered 2015-09-28: 650 mg via RECTAL

## 2015-09-28 MED ORDER — SODIUM CHLORIDE 0.9 % IV SOLN
INTRAVENOUS | Status: DC | PRN
  Administered 2015-09-28: 5 g via INTRAVENOUS

## 2015-09-28 MED ORDER — CHLORHEXIDINE GLUCONATE 0.12 % MT SOLN
15.0000 mL | OROMUCOSAL | Status: AC
  Administered 2015-09-28: 15 mL via OROMUCOSAL
  Filled 2015-09-28: qty 15

## 2015-09-28 MED ORDER — PHENYLEPHRINE HCL 10 MG/ML IJ SOLN
0.0000 ug/min | INTRAVENOUS | Status: DC
  Administered 2015-09-28: 75 ug/min via INTRAVENOUS
  Administered 2015-09-29: 60 ug/min via INTRAVENOUS
  Filled 2015-09-28 (×2): qty 4

## 2015-09-28 MED ORDER — NITROGLYCERIN IN D5W 200-5 MCG/ML-% IV SOLN: 0.0000 ug/min | INTRAVENOUS | Status: DC

## 2015-09-28 MED ORDER — SODIUM CHLORIDE 0.9% FLUSH: 3.0000 mL | INTRAVENOUS | Status: DC | PRN

## 2015-09-28 MED ORDER — ALBUMIN HUMAN 5 % IV SOLN
250.0000 mL | INTRAVENOUS | Status: AC | PRN
  Administered 2015-09-28: 250 mL via INTRAVENOUS
  Administered 2015-09-28: 14:00:00 via INTRAVENOUS

## 2015-09-28 MED ORDER — SODIUM CHLORIDE 0.9 % IV SOLN
INTRAVENOUS | Status: DC | PRN
  Administered 2015-09-28: 07:00:00 via INTRAVENOUS

## 2015-09-28 MED ORDER — SUCCINYLCHOLINE CHLORIDE 200 MG/10ML IV SOSY
PREFILLED_SYRINGE | INTRAVENOUS | Status: AC
  Filled 2015-09-28: qty 10

## 2015-09-28 MED ORDER — SUCCINYLCHOLINE CHLORIDE 20 MG/ML IJ SOLN
INTRAMUSCULAR | Status: DC | PRN
  Administered 2015-09-28: 120 mg via INTRAVENOUS

## 2015-09-28 MED ORDER — MIDAZOLAM HCL 10 MG/2ML IJ SOLN
INTRAMUSCULAR | Status: AC
  Filled 2015-09-28: qty 2

## 2015-09-28 MED ORDER — METOPROLOL TARTRATE 12.5 MG HALF TABLET
12.5000 mg | ORAL_TABLET | Freq: Two times a day (BID) | ORAL | Status: DC
  Administered 2015-09-30 – 2015-10-03 (×6): 12.5 mg via ORAL
  Filled 2015-09-28 (×6): qty 1

## 2015-09-28 MED ORDER — CALCIUM CHLORIDE 10 % IV SOLN
INTRAVENOUS | Status: DC | PRN
  Administered 2015-09-28: 300 mg via INTRAVENOUS
  Administered 2015-09-28: 200 mg via INTRAVENOUS
  Administered 2015-09-28: 300 mg via INTRAVENOUS

## 2015-09-28 MED ORDER — VANCOMYCIN HCL 1000 MG IV SOLR
INTRAVENOUS | Status: DC | PRN
  Administered 2015-09-28: 1500 mg via INTRAVENOUS

## 2015-09-28 MED ORDER — SODIUM CHLORIDE 0.9 % IV SOLN: 250.0000 mL | INTRAVENOUS | Status: DC

## 2015-09-28 MED ORDER — DOPAMINE-DEXTROSE 3.2-5 MG/ML-% IV SOLN
0.0000 ug/kg/min | INTRAVENOUS | Status: DC
  Administered 2015-09-29: 6 ug/kg/min via INTRAVENOUS
  Filled 2015-09-28: qty 250

## 2015-09-28 MED ORDER — PROTAMINE SULFATE 10 MG/ML IV SOLN
INTRAVENOUS | Status: DC | PRN
  Administered 2015-09-28: 400 mg via INTRAVENOUS

## 2015-09-28 MED ORDER — TRAMADOL HCL 50 MG PO TABS
50.0000 mg | ORAL_TABLET | ORAL | Status: DC | PRN
  Administered 2015-10-01 – 2015-10-02 (×2): 50 mg via ORAL
  Filled 2015-09-28 (×2): qty 1

## 2015-09-28 MED ORDER — DOPAMINE-DEXTROSE 3.2-5 MG/ML-% IV SOLN
INTRAVENOUS | Status: DC | PRN
  Administered 2015-09-28: 3 ug/kg/min via INTRAVENOUS

## 2015-09-28 MED ORDER — PROTAMINE SULFATE 10 MG/ML IV SOLN
INTRAVENOUS | Status: AC
  Filled 2015-09-28: qty 50

## 2015-09-28 MED ORDER — BISACODYL 10 MG RE SUPP: 10.0000 mg | Freq: Every day | RECTAL | Status: DC

## 2015-09-28 MED ORDER — SODIUM CHLORIDE 0.9 % IV SOLN
INTRAVENOUS | Status: DC | PRN
  Administered 2015-09-28: 1 [IU]/h via INTRAVENOUS

## 2015-09-28 MED ORDER — INSULIN REGULAR HUMAN 100 UNIT/ML IJ SOLN
INTRAMUSCULAR | Status: DC
  Filled 2015-09-28 (×2): qty 2.5

## 2015-09-28 MED FILL — Heparin Sodium (Porcine) Inj 1000 Unit/ML: INTRAMUSCULAR | Qty: 30 | Status: AC

## 2015-09-28 MED FILL — Magnesium Sulfate Inj 50%: INTRAMUSCULAR | Qty: 10 | Status: AC

## 2015-09-28 MED FILL — Potassium Chloride Inj 2 mEq/ML: INTRAVENOUS | Qty: 40 | Status: AC

## 2015-09-28 SURGICAL SUPPLY — 74 items
ADH SKN CLS APL DERMABOND .7 (GAUZE/BANDAGES/DRESSINGS) ×3
BAG DECANTER FOR FLEXI CONT (MISCELLANEOUS) ×4 IMPLANT
BANDAGE ACE 4X5 VEL STRL LF (GAUZE/BANDAGES/DRESSINGS) ×5 IMPLANT
BANDAGE ACE 6X5 VEL STRL LF (GAUZE/BANDAGES/DRESSINGS) ×5 IMPLANT
BLADE STERNUM SYSTEM 6 (BLADE) ×4 IMPLANT
BLOOD HAEMOCONCENTR 700 MIDI (MISCELLANEOUS) ×1 IMPLANT
BNDG GAUZE ELAST 4 BULKY (GAUZE/BANDAGES/DRESSINGS) ×5 IMPLANT
CANISTER SUCTION 2500CC (MISCELLANEOUS) ×4 IMPLANT
CATH CPB KIT GERHARDT (MISCELLANEOUS) ×4 IMPLANT
CATH THORACIC 28FR (CATHETERS) ×4 IMPLANT
CRADLE DONUT ADULT HEAD (MISCELLANEOUS) ×4 IMPLANT
DERMABOND ADVANCED (GAUZE/BANDAGES/DRESSINGS) ×1
DERMABOND ADVANCED .7 DNX12 (GAUZE/BANDAGES/DRESSINGS) IMPLANT
DRAIN CHANNEL 28F RND 3/8 FF (WOUND CARE) ×4 IMPLANT
DRAPE CARDIOVASCULAR INCISE (DRAPES) ×4
DRAPE SLUSH/WARMER DISC (DRAPES) ×4 IMPLANT
DRAPE SRG 135X102X78XABS (DRAPES) ×3 IMPLANT
DRSG AQUACEL AG ADV 3.5X14 (GAUZE/BANDAGES/DRESSINGS) ×4 IMPLANT
ELECT BLADE 4.0 EZ CLEAN MEGAD (MISCELLANEOUS) ×4
ELECT REM PT RETURN 9FT ADLT (ELECTROSURGICAL) ×8
ELECTRODE BLDE 4.0 EZ CLN MEGD (MISCELLANEOUS) ×3 IMPLANT
ELECTRODE REM PT RTRN 9FT ADLT (ELECTROSURGICAL) ×6 IMPLANT
FELT TEFLON 1X6 (MISCELLANEOUS) ×8 IMPLANT
GAUZE SPONGE 4X4 12PLY STRL (GAUZE/BANDAGES/DRESSINGS) ×6 IMPLANT
GLOVE BIO SURGEON STRL SZ 6 (GLOVE) ×6 IMPLANT
GLOVE BIO SURGEON STRL SZ 6.5 (GLOVE) ×12 IMPLANT
GLOVE BIO SURGEON STRL SZ7 (GLOVE) ×2 IMPLANT
GLOVE BIO SURGEON STRL SZ7.5 (GLOVE) ×2 IMPLANT
GLOVE BIOGEL PI IND STRL 6.5 (GLOVE) IMPLANT
GLOVE BIOGEL PI INDICATOR 6.5 (GLOVE) ×3
GOWN STRL REUS W/ TWL LRG LVL3 (GOWN DISPOSABLE) ×12 IMPLANT
GOWN STRL REUS W/TWL LRG LVL3 (GOWN DISPOSABLE) ×28
HEMOSTAT POWDER SURGIFOAM 1G (HEMOSTASIS) ×12 IMPLANT
HEMOSTAT SURGICEL 2X14 (HEMOSTASIS) ×4 IMPLANT
KIT BASIN OR (CUSTOM PROCEDURE TRAY) ×4 IMPLANT
KIT CATH SUCT 8FR (CATHETERS) ×4 IMPLANT
KIT ROOM TURNOVER OR (KITS) ×4 IMPLANT
KIT SUCTION CATH 14FR (SUCTIONS) ×9 IMPLANT
KIT VASOVIEW HEMOPRO VH 3000 (KITS) ×4 IMPLANT
LEAD PACING MYOCARDI (MISCELLANEOUS) ×4 IMPLANT
MARKER GRAFT CORONARY BYPASS (MISCELLANEOUS) ×12 IMPLANT
NS IRRIG 1000ML POUR BTL (IV SOLUTION) ×20 IMPLANT
PACK OPEN HEART (CUSTOM PROCEDURE TRAY) ×4 IMPLANT
PAD ARMBOARD 7.5X6 YLW CONV (MISCELLANEOUS) ×8 IMPLANT
PAD ELECT DEFIB RADIOL ZOLL (MISCELLANEOUS) ×4 IMPLANT
PENCIL BUTTON HOLSTER BLD 10FT (ELECTRODE) ×4 IMPLANT
PUNCH AORTIC ROTATE  4.5MM 8IN (MISCELLANEOUS) ×1 IMPLANT
SET CARDIOPLEGIA MPS 5001102 (MISCELLANEOUS) ×1 IMPLANT
SPONGE GAUZE 4X4 12PLY STER LF (GAUZE/BANDAGES/DRESSINGS) ×3 IMPLANT
SPONGE LAP 18X18 X RAY DECT (DISPOSABLE) ×1 IMPLANT
SUT BONE WAX W31G (SUTURE) ×4 IMPLANT
SUT PROLENE 3 0 SH1 36 (SUTURE) ×4 IMPLANT
SUT PROLENE 4 0 TF (SUTURE) ×8 IMPLANT
SUT PROLENE 5 0 C 1 36 (SUTURE) ×1 IMPLANT
SUT PROLENE 6 0 C 1 30 (SUTURE) ×1 IMPLANT
SUT PROLENE 6 0 CC (SUTURE) ×8 IMPLANT
SUT PROLENE 7 0 BV1 MDA (SUTURE) ×4 IMPLANT
SUT PROLENE 8 0 BV175 6 (SUTURE) ×1 IMPLANT
SUT STEEL 6MS V (SUTURE) ×4 IMPLANT
SUT STEEL SZ 6 DBL 3X14 BALL (SUTURE) ×4 IMPLANT
SUT VIC AB 1 CTX 18 (SUTURE) ×9 IMPLANT
SUT VIC AB 2-0 CT1 27 (SUTURE) ×4
SUT VIC AB 2-0 CT1 TAPERPNT 27 (SUTURE) IMPLANT
SUT VIC AB 3-0 X1 27 (SUTURE) ×1 IMPLANT
SUTURE E-PAK OPEN HEART (SUTURE) ×4 IMPLANT
SYSTEM SAHARA CHEST DRAIN ATS (WOUND CARE) ×4 IMPLANT
TAPE CLOTH SURG 4X10 WHT LF (GAUZE/BANDAGES/DRESSINGS) ×1 IMPLANT
TOWEL OR 17X24 6PK STRL BLUE (TOWEL DISPOSABLE) ×9 IMPLANT
TOWEL OR 17X26 10 PK STRL BLUE (TOWEL DISPOSABLE) ×8 IMPLANT
TRAY FOLEY IC TEMP SENS 16FR (CATHETERS) ×4 IMPLANT
TUBING INSUFFLATION (TUBING) ×4 IMPLANT
UNDERPAD 30X30 (UNDERPADS AND DIAPERS) ×4 IMPLANT
WATER STERILE IRR 1000ML POUR (IV SOLUTION) ×8 IMPLANT
YANKAUER SUCT BULB TIP NO VENT (SUCTIONS) ×1 IMPLANT

## 2015-09-28 NOTE — Progress Notes (Signed)
Wean protocol initiated. 

## 2015-09-28 NOTE — Progress Notes (Signed)
Patient to O.R. Via stretcher and with family. Belongings given to girlfriend.personal stuff w/ I.S. Taken to SICU. No complaints voiced. CM. aware

## 2015-09-28 NOTE — Anesthesia Preprocedure Evaluation (Addendum)
Anesthesia Evaluation  Patient identified by MRN, date of birth, ID band Patient awake    Reviewed: Allergy & Precautions, NPO status , Patient's Chart, lab work & pertinent test results  Airway Mallampati: II  TM Distance: >3 FB Neck ROM: Full    Dental  (+) Teeth Intact   Pulmonary asthma ,    breath sounds clear to auscultation       Cardiovascular hypertension, + angina + CAD, + Past MI and + Peripheral Vascular Disease   Rhythm:Regular Rate:Normal  Study Conclusions  - Left ventricle: The cavity size was normal. Septal wall thickness   was increased in a pattern of moderate LVH with severe   hypertrophy of the posterior wall. Systolic function was normal.   The estimated ejection fraction was in the range of 60% to 65%.   Wall motion was normal; there were no regional wall motion   abnormalities. Doppler parameters are consistent with abnormal   left ventricular relaxation (grade 1 diastolic dysfunction).   Doppler parameters are consistent with indeterminate ventricular   filling pressure. - Aortic valve: Transvalvular velocity was within the normal range.   There was no stenosis. There was no regurgitation. - Mitral valve: Transvalvular velocity was within the normal range.   There was no evidence for stenosis. There was trivial   regurgitation. - Right ventricle: The cavity size was normal. Wall thickness was   normal. Systolic function was normal. - Tricuspid valve: There was no regurgitation.   Neuro/Psych  Headaches,    GI/Hepatic   Endo/Other  diabetesHypothyroidism   Renal/GU Renal disease     Musculoskeletal  (+) Arthritis ,   Abdominal (+) + obese,   Peds  Hematology negative hematology ROS (+)   Anesthesia Other Findings   Reproductive/Obstetrics                           Anesthesia Physical Anesthesia Plan  ASA: III  Anesthesia Plan: General   Post-op Pain  Management:    Induction: Intravenous  Airway Management Planned: Oral ETT  Additional Equipment: Arterial line, TEE, PA Cath and Ultrasound Guidance Line Placement  Intra-op Plan:   Post-operative Plan: Post-operative intubation/ventilation  Informed Consent: I have reviewed the patients History and Physical, chart, labs and discussed the procedure including the risks, benefits and alternatives for the proposed anesthesia with the patient or authorized representative who has indicated his/her understanding and acceptance.   Dental advisory given  Plan Discussed with: CRNA  Anesthesia Plan Comments:         Anesthesia Quick Evaluation

## 2015-09-28 NOTE — Progress Notes (Signed)
Patient ID: Russell BirkenheadSteven B [name], male   DOB: December 21, 1970, 45 y.o.   MRN: [id number] EVENING ROUNDS NOTE :     [address]       [place] [id number]             [phone]                 Day of Surgery Procedure(s) (LRB): CORONARY ARTERY BYPASS GRAFTING (CABG) x 3 UTILIZING LEFT MAMMARY ARTERY AND ENDOSCOPICALLY HARVESTED LEFT GREATER SAPHENOUS VEIN. (N/A) TRANSESOPHAGEAL ECHOCARDIOGRAM (TEE) (N/A) ENDOVEIN HARVEST OF GREATER SAPHENOUS VEIN (Left)  Total Length of Stay:  LOS: 8 days  BP 105/70   Pulse 89   Temp 98.6 F (37 C)   Resp 17   Ht 6\' 2"  (1.88 m)   Wt 262 lb (118.8 kg)   SpO2 97%   BMI 33.64 kg/m   .Intake/Output      09/25 0701 - 09/26 0700 09/26 0701 - 09/27 0700   P.O. 480    I.V. (mL/kg) 144 (1.2) 1733.8 (14.6)   Blood  630   NG/GT 0    IV Piggyback 0 550   Total Intake(mL/kg) 624 (5.3) 2913.8 (24.5)   Urine (mL/kg/hr) 0 (0) 87 (0.1)   Other 5500 (1.9)    Chest Tube  88 (0.1)   Total Output 5500 175   Net -4876 +2738.8        Urine Occurrence 2 x      . sodium chloride 20 mL/hr at 09/28/15 1800  . [START ON 09/29/2015] sodium chloride    . sodium chloride 10 mL/hr at 09/28/15 1800  . dexmedetomidine 0.1 mcg/kg/hr (09/28/15 1800)  . DOPamine 6 mcg/kg/min (09/28/15 1800)  . insulin (NOVOLIN-R) infusion 1.2 Units/hr (09/28/15 1800)  . lactated ringers    . lactated ringers    . nitroGLYCERIN Stopped (09/28/15 1445)  . phenylephrine (NEO-SYNEPHRINE) Adult infusion 75 mcg/min (09/28/15 1800)     Lab Results  Component Value Date   WBC 13.4 (H) 09/28/2015   HGB 10.2 (L) 09/28/2015   HGB 9.9 (L) 09/28/2015   HCT 32.8 (L) 09/28/2015   HCT 29.0 (L) 09/28/2015   PLT 224 09/28/2015   GLUCOSE 200 (H) 09/28/2015   CHOL 203 (H) 09/21/2015   TRIG 294 (H) 09/21/2015   HDL 33 (L) 09/21/2015   LDLCALC 111 (H) 09/21/2015   ALT 15 (L) 09/20/2015   AST 22 09/20/2015   NA 135 09/28/2015   K 3.6 09/28/2015   CL 96 (L) 09/28/2015   CREATININE  6.60 (H) 09/28/2015   BUN 38 (H) 09/28/2015   CO2 26 09/28/2015   TSH 1.243 07/20/2015   INR 1.33 09/28/2015   HGBA1C 16.8 (H) 07/20/2015   MICROALBUR 24.84 (H) 12/09/2012   Waking up, following commands  Not bleeding k ok  Delight OvensEdward B Jackye Dever MD  Beeper [phone] Office [phone] 09/28/2015 6:30 PM

## 2015-09-28 NOTE — Anesthesia Procedure Notes (Signed)
Procedure Name: Intubation Date/Time: 09/28/2015 8:03 AM Performed by: Ferol LuzMCMILLEN, Haskell Rihn L Pre-anesthesia Checklist: Patient identified, Emergency Drugs available, Suction available and Patient being monitored Patient Re-evaluated:Patient Re-evaluated prior to inductionOxygen Delivery Method: Circle System Utilized Preoxygenation: Pre-oxygenation with 100% oxygen Intubation Type: IV induction and Cricoid Pressure applied Ventilation: Mask ventilation with difficulty and Oral airway inserted - appropriate to patient size Laryngoscope Size: Mac and 4 Grade View: Grade III Tube type: Oral Tube size: 8.0 mm Number of attempts: 1 Airway Equipment and Method: Stylet Placement Confirmation: ETT inserted through vocal cords under direct vision,  positive ETCO2 and breath sounds checked- equal and bilateral Secured at: 22 cm Tube secured with: Tape Dental Injury: Teeth and Oropharynx as per pre-operative assessment

## 2015-09-28 NOTE — OR Nursing (Signed)
1st call to SICU 1218.  2nd call to SICU 1255 Spoke with Wynona Caneshristine.

## 2015-09-28 NOTE — Anesthesia Postprocedure Evaluation (Signed)
Anesthesia Post Note  Patient: Fonnie BirkenheadSteven B Allston  Procedure(s) Performed: Procedure(s) (LRB): CORONARY ARTERY BYPASS GRAFTING (CABG) x 3 UTILIZING LEFT MAMMARY ARTERY AND ENDOSCOPICALLY HARVESTED LEFT GREATER SAPHENOUS VEIN. (N/A) TRANSESOPHAGEAL ECHOCARDIOGRAM (TEE) (N/A) ENDOVEIN HARVEST OF GREATER SAPHENOUS VEIN (Left)  Patient location during evaluation: SICU Anesthesia Type: General Level of consciousness: sedated and patient remains intubated per anesthesia plan Pain management: pain level controlled Vital Signs Assessment: post-procedure vital signs reviewed and stable Respiratory status: patient remains intubated per anesthesia plan Cardiovascular status: stable Anesthetic complications: no    Last Vitals:  Vitals:   09/28/15 1500 09/28/15 1515  BP: (!) 91/59   Pulse: 89 89  Resp: 16 13  Temp: 36.2 C 36.2 C    Last Pain:  Vitals:   09/28/15 0513  TempSrc: Oral  PainSc:                  Johnchristopher Sarvis,[name] TERRILL

## 2015-09-28 NOTE — Brief Op Note (Signed)
09/20/2015 - 09/28/2015  11:24 AM  PATIENT:  Russell [name]  45 y.o. male      [address]       [place] [id number]             [phone]     09/20/2015 - 09/28/2015  11:25 AM  PATIENT:  Russell [name]  45 y.o. male  PRE-OPERATIVE DIAGNOSIS:  Coronary Artery Disease  POST-OPERATIVE DIAGNOSIS:  Coronary Artery Disease  PROCEDURE:  Procedure(s): CORONARY ARTERY BYPASS GRAFTING (CABG) x 3 UTILIZING LEFT MAMMARY ARTERY AND ENDOSCOPICALLY HARVESTED LEFT GREATER SAPHENOUS VEIN. TRANSESOPHAGEAL ECHOCARDIOGRAM (TEE) ENDOVEIN HARVEST OF GREATER SAPHENOUS VEIN(EVH) LIMA-LAD; SVG-OM; SVG-DIAG  SURGEON:  Surgeon(s): Delight OvensEdward B Gerhardt, MD  PHYSICIAN ASSISTANT: Chanita Boden PA-C  ANESTHESIA:   general  PATIENT CONDITION:  ICU - intubated and hemodynamically stable.  PRE-OPERATIVE WEIGHT: 118kg  COMPLICATIONS: NO KNOWN

## 2015-09-28 NOTE — Progress Notes (Signed)
Transesophageal Echocardiogram was performed.

## 2015-09-28 NOTE — Plan of Care (Signed)
Problem: Respiratory: Goal: Ability to tolerate decreased levels of ventilator support will improve Outcome: Progressing Pt. Is alert and repeatedly activating high pressure alarms.Respiratory notified and are collaborating. Pt. Has been suctioned and reoriented, but alarms continue. Blood gas has been drawn and settings adjusted. Will continue to monitor and reassess.

## 2015-09-28 NOTE — Progress Notes (Signed)
Dialysis Orders: [facility] MWF 4 hours 450/800 117 kg 2.0 K/2.5 Ca bath. UF profile 4 Heparin 8000 units IV at start of tx Heparin 3000 units IV mid run Mircera 75 mcg IV q 2 weeks (last dose 09/15/15 Has HGB 10.0 09/15/15) Venofer 50 mg IV weekly (last dose 09*131/17 Fe 75 Tsat 29 08/18/15) Calcitriol 0.25 mcg PO q treatment (last PTH 424 08/18/15)  Ca 8.7 C Ca 8.9 Phos 7.6 (08/18/15) Takes Phoslo 667 mg 3 capsules PO TID AC and 2 capsules with snacks.  Assessment/Plan: 1. NSTEMI/Severe 2 vessel disease EF 60 - 65%  s/p CABG x 3.  On dopamine and phenyl.  Will give one 5% albumin in efffort to wean pressors; however would not give more than 500 total mL of fluid as pt with large fluid gains between HD sessions. 2. ESRD-MWF - next HD plan for 9/27 but can readjust depending on hemodynamics 3. Anemia- HGB 9.3 9/24 -Rec'd Aranesp 40 mcg IV 09/20- have increased to 100 9/27; transfuse prn after surgery 4. Secondary hyperparathyroidism- P 6.7/bindersVDRA. -follow labs 5.HTN/volume-  large fluid gains between sessions.  117 kg EDW. 6. Nutrition- Albumin 2.8. PCM in the setting of obesity. Renal/Carb mod diet. Nepro/renal vit 7. DM: per primary. BS 200s 8. Hypothyroidism: per primary   Bufford Buttner MD [facility] pager [phone]   cell [phone] 09/28/2015, 4:40 PM   Subjective:   S/p 3 vessel CABG.  Intubated in ICU.    Objective Vitals:   09/28/15 1445 09/28/15 1500 09/28/15 1515 09/28/15 1548  BP:  (!) 91/59    Pulse: 89 89 89   Resp: 15 16 13    Temp: 97.2 F (36.2 C) 97.2 F (36.2 C) 97.2 F (36.2 C)   TempSrc:      SpO2: 94% 93% 95% 92%  Weight:      Height:       Physical Exam General: NAD, intubated, sedated Heart: regular, no r/g.   Lungs: coarse mechanical breath sounds bilaterally.  Dyssynchronous with vent Abdomen: soft NT Extremities: bilateral LE wrapped Dialysis Access:  Left AVF + bruit   Additional  Objective Labs: Basic Metabolic Panel:  Recent Labs Lab 09/26/15 0221 09/27/15 0433 09/27/15 1144 09/28/15 0326  09/28/15 1026 09/28/15 1112 09/28/15 1219 09/28/15 1356  NA 132* 129* 129* 135  < > 135 132* 133* 135  K 3.8 4.0 5.2* 3.8  < > 4.3 4.6 4.1 3.6  CL 94* 93* 94* 97*  < > 96* 96* 96*  --   CO2 24 21* 20* 26  --   --   --   --   --   GLUCOSE 245* 258* 213* 135*  < > 140* 159* 267* 200*  BUN 50* 73* 77* 39*  < > 36* 39* 38*  --   CREATININE 7.79* 9.04* 9.06* 6.32*  < > 6.30* 6.20* 6.60*  --   CALCIUM 8.7* 8.7* 8.7* 8.5*  --   --   --   --   --   PHOS 6.7* 7.6* 6.9*  --   --   --   --   --   --   < > = values in this interval not displayed. Liver Function Tests:  Recent Labs Lab 09/26/15 0221 09/27/15 0433 09/27/15 1144  ALBUMIN 3.0* 3.2* 3.4*   No results for input(s): LIPASE, AMYLASE in the last 168 hours. CBC:  Recent Labs Lab 09/25/15 0743 09/26/15 0221 09/27/15 0433 09/28/15 0326  09/28/15 1110 09/28/15 1112  09/28/15 1219 09/28/15 1356  WBC 7.2 7.0 6.7 7.9  --   --   --   --  13.4*  HGB 9.8* 9.3* 9.3* 9.5*  < > 7.4* 8.2* 9.2* 9.9*  10.2*  HCT 31.1* 28.9* 28.4* 30.2*  < > 23.5* 24.0* 27.0* 29.0*  32.8*  MCV 88.4 87.0 86.9 87.8  --   --   --   --  87.7  PLT 266 284 271 279  --  242  --   --  224  < > = values in this interval not displayed. Blood Culture No results found for: SDES, SPECREQUEST, CULT, REPTSTATUS  Cardiac Enzymes: No results for input(s): CKTOTAL, CKMB, CKMBINDEX, TROPONINI in the last 168 hours. CBG:  Recent Labs Lab 09/27/15 1755 09/27/15 2048 09/28/15 0503 09/28/15 1504 09/28/15 1608  GLUCAP 116* 215* 111* 154* 126*   Iron Studies: No results for input(s): IRON, TIBC, TRANSFERRIN, FERRITIN in the last 72 hours. Lab Results  Component Value Date   INR 1.33 09/28/2015   INR 1.06 09/28/2015   INR 1.01 09/27/2015   Studies/Results: Dg Chest 2 View  Result Date: 09/27/2015 CLINICAL DATA:  Preoperative chest  radiograph for CABG. Initial encounter. EXAM: CHEST  2 VIEW COMPARISON:  Chest radiograph performed 09/20/2015 FINDINGS: The lungs are well-aerated and clear. There is no evidence of focal opacification, pleural effusion or pneumothorax. The heart is normal in size; the mediastinal contour is within normal limits. No acute osseous abnormalities are seen. IMPRESSION: No acute cardiopulmonary process seen. Electronically Signed   By: Roanna RaiderJeffery  Chang M.D.   On: 09/27/2015 19:36   Dg Chest Port 1 View  Result Date: 09/28/2015 CLINICAL DATA:  Status post CABG.  Swan-Ganz catheter adjustment. EXAM: PORTABLE CHEST 1 VIEW COMPARISON:  09/28/2015 at 1418 hours FINDINGS: Endotracheal tube tip has been advanced to the level of the clavicular heads. Swan-Ganz catheter has been advanced and overlaps the left hilum. An orogastric tube tip reaches the stomach. Thoracic drains remain. Low volume chest with atelectasis. Stable postoperative heart size in this patient with CABG. No effusion or visible pneumothorax. IMPRESSION: 1. Improved endotracheal tube positioning after advancement. The tip is at the clavicular heads. 2. Advanced Swan-Ganz catheter with tip over the left hilum. Looping at the right atrium is resolved. 3. Low volume chest with atelectasis. Electronically Signed   By: Marnee SpringJonathon  Watts M.D.   On: 09/28/2015 15:43   Dg Chest Port 1 View  Result Date: 09/28/2015 CLINICAL DATA:  Postop day 0 CABG. EXAM: PORTABLE CHEST 1 VIEW COMPARISON:  09/27/2015, 09/20/2015 and earlier, including CTA chest 07/19/2015. FINDINGS: Endotracheal tube tip below the thoracic inlet approximately 7-8 cm above the carina. Swan-Ganz catheter looped in the right atrium with its tip projected at the expected location of right ventricle. Nasogastric tube courses below the diaphragm into the stomach. Left chest tube in place with no pneumothorax. Atelectasis in the upper lobes and lung bases bilaterally, left greater than right. IMPRESSION:  1. Swan-Ganz catheter looped in the right atrium with its tip at the expected position of the right ventricle. 2. Remaining support apparatus satisfactory. 3. No pneumothorax. 4. Atelectasis involving both lungs, left greater than right. I telephoned these results at the time of interpretation on 09/28/2015 at 2:26 pm to [facility], the nurse caring for the patient in the ICU, who verbally acknowledged these results. Electronically Signed   By: Hulan Saashomas  Lawrence M.D.   On: 09/28/2015 14:31   Medications: . sodium chloride 20 mL/hr at 09/28/15 1600  . [  START ON 09/29/2015] sodium chloride    . sodium chloride 10 mL/hr at 09/28/15 1600  . dexmedetomidine 0.3 mcg/kg/hr (09/28/15 1600)  . DOPamine 6 mcg/kg/min (09/28/15 1600)  . insulin (NOVOLIN-R) infusion 3.3 Units/hr (09/28/15 1609)  . lactated ringers    . lactated ringers    . nitroGLYCERIN Stopped (09/28/15 1445)  . phenylephrine (NEO-SYNEPHRINE) Adult infusion     . [START ON 09/29/2015] acetaminophen  1,000 mg Oral Q6H   Or  . [START ON 09/29/2015] acetaminophen (TYLENOL) oral liquid 160 mg/5 mL  1,000 mg Per Tube Q6H  . [COMPLETED] acetaminophen (TYLENOL) oral liquid 160 mg/5 mL  650 mg Per Tube Once   Or  . [COMPLETED] acetaminophen  650 mg Rectal Once  . [START ON 09/29/2015] aspirin EC  325 mg Oral Daily   Or  . [START ON 09/29/2015] aspirin  324 mg Per Tube Daily  . atorvastatin  80 mg Oral q1800  . [START ON 09/29/2015] bisacodyl  10 mg Oral Daily   Or  . [START ON 09/29/2015] bisacodyl  10 mg Rectal Daily  . calcitRIOL  0.25 mcg Oral Q M,W,F-HD  . calcium acetate  2,001 mg Oral TID WC  . cefUROXime (ZINACEF)  IV  1.5 g Intravenous Q12H  . [START ON 09/29/2015] darbepoetin (ARANESP) injection - DIALYSIS  100 mcg Intravenous Q Wed-HD  . [START ON 09/29/2015] docusate sodium  200 mg Oral Daily  . famotidine (PEPCID) IV  20 mg Intravenous Q12H  . insulin regular  0-10 Units Intravenous TID WC  . levothyroxine  50 mcg Oral QAC breakfast  .  magnesium sulfate  4 g Intravenous Once  . metoprolol tartrate  12.5 mg Oral BID   Or  . metoprolol tartrate  12.5 mg Per Tube BID  . midazolam      . multivitamin  1 tablet Oral QHS  . mupirocin ointment  1 application Nasal BID  . [START ON 09/30/2015] pantoprazole  40 mg Oral Daily  . potassium chloride  10 mEq Intravenous Q1 Hr x 3  . [START ON 09/29/2015] sodium chloride flush  3 mL Intravenous Q12H  . vancomycin  1,000 mg Intravenous Once

## 2015-09-28 NOTE — Progress Notes (Signed)
Family Medicine Teaching Service Daily Progress Note Intern Pager: [phone]  Patient name: Russell [name] Medical record number: [id number] Date of birth: 1970-05-13 Age: 45 y.o. Gender: male  Primary Care Provider: [name], MD Consultants: Cardiology Code Status: Full  Pt Overview and Major Events to Date:  Admitted 9/18 for hyperglycemia and chest pain. 9/18: transitioned off insulin gtt, now on SQ insulin 9/20: Cath done- severe 2 vessel disease, started on nitro drip for chest pain and HTN 9/21: off nitro drip. Added imdur 60, metoprolol increased to 100 BID 9/23: increased amlodipine  9/26 patient taken to CABG  Assessment and Plan: Russell [name] is a 45 y.o. male presenting with hyperglycemia, chest pain.Marland Kitchen PMH is significant for ESRD on HD MWF, insulin dependent T2DM, HTN, anemia of chronic disease, peripheral neuropathy, hypothyroidism, depression, GERD.   # Chest Pain, unstable angina:  Resolved chest pain. Troponin 1.71 > 2.1 > 1.83 > 1.68. Echo this hospitalization with normal EF, moderate LVH w/ severe hypertrophy posterior wall, no valve abnormality. ASCVD score 9.9%. Cath showed severe two-vessel coronary arte involving a bifurcation proximal LAD/diagonal 1 and proximal large ramus. - f/u cardiology recs - CABG today, will transition care to CVTS - heparin gtt  # T2DM: insulin dependent, uncontrolled. Last A1c 16.8 on 07/20/15. Reports he lost insurance June/July and was not taking insulin. Home regimen humalog 5:1 per his report, however notes he's doing lantus 60 units BID with mealtime humalog. He does not have set mealtimes d/t HD schedule and feels Lantus BID works best for him.  - BMP daily - Mealtime SSI (used 17 units over the past 24 hours) -Lantus 30 units BID, halving lantus on 9/25 evening as he will be NPO 9/26. BE SURE TO RESTART Lantus 30 BID when taking PO -CBGs q4h with meals and qHS  # Hypertension: hx of essential hypertension. Was on  amlodipine, clonidine, metoprolol. Titrating up BP meds as tolerated. UDS neg. BPs elevated during/after cath. Off nitro gtt.  - amlodipine increased to 10mg , metoprolol tartrate 100 mg BID - now on imdur 60 mg daily - continue to monitor   # Headache resolved:  - tylenol prn pain - muscle cream for back of neck  # ESRD: MWF dialysis since 12/2014 after developing nephrotic syndrome, followed by Dr. [name]. H/o horseshoe kidney. - anticipate HD Monday, 9/25 -continue home calcium acetate - nephrology for HD management.  # Hyperlipidemia: Total cholesterol 203. Not currently on statin at home. ASCVD risk 9%, high intensity statin recommended. Pt has history of irritability while on different statins in past. Pt notes they "make him ill" and  he gets very angry with them. He notes taking simvastatin and 2 others but cannot recall which ones.  - started lipitor 80mg  daily -on lovaza  # Hypothyroidism: stable, last TSH 1.2 on 07/20/15 -continue home synthroid  # Depression: stable -continue home celexa, Klonopin - continue to monitor QTc in setting of celexa use.   # Anemia of chronic kidney disease: Hgb stable at 9.3, same as on admission. Baseline Hgb 11.  -monitor CBC - pt receives EPO with HD.   # Peripheral neuropathy: secondary to T2DM -restarting gabapentin to appropriate renal dose, 100 TID - not continuing home pain meds for now- has 1 time dose of PRN Dilaudid Iv. - tramadol prn pain started by cardiology.   FEN/GI: carb modified/renal diet Prophylaxis: heparin drip  Disposition: Home  Subjective:  Unable to examine today as patient is already down for surgery.  Objective:  Temp:  [97.6 F (36.4 C)-98.4 F (36.9 C)] 98 F (36.7 C) (09/26 0513) Pulse Rate:  [66-74] 67 (09/26 0513) Resp:  [14-18] 18 (09/26 0513) BP: (103-161)/(55-91) 109/70 (09/26 0513) SpO2:  [97 %-100 %] 100 % (09/26 0513) Weight:  [260 lb 9.3 oz (118.2 kg)-272 lb 11.3 oz (123.7 kg)]  262 lb (118.8 kg) (09/26 0513) Physical Exam: Unable to examine today as patient is already down for surgery.  Laboratory:  Recent Labs Lab 09/26/15 0221 09/27/15 0433 09/28/15 0326  WBC 7.0 6.7 7.9  HGB 9.3* 9.3* 9.5*  HCT 28.9* 28.4* 30.2*  PLT 284 271 279    Recent Labs Lab 09/27/15 0433 09/27/15 1144 09/28/15 0326  NA 129* 129* 135  K 4.0 5.2* 3.8  CL 93* 94* 97*  CO2 21* 20* 26  BUN 73* 77* 39*  CREATININE 9.04* 9.06* 6.32*  CALCIUM 8.7* 8.7* 8.5*  GLUCOSE 258* 213* 135*    Risk Stratification Labs  TSH    Component Value Date/Time   TSH 1.243 07/20/2015 0427   TSH 3.492 04/10/2014 1054   Hemoglobin A1C    Component Value Date/Time   HGBA1C 16.8 (H) 07/20/2015 0427   Lipid Panel     Component Value Date/Time   CHOL 203 (H) 09/21/2015 0245   TRIG 294 (H) 09/21/2015 0245   HDL 33 (L) 09/21/2015 0245   CHOLHDL 6.2 09/21/2015 0245   VLDL 59 (H) 09/21/2015 0245   LDLCALC 111 (H) 09/21/2015 0245   ABG: 7.4 / 39.5 / 76 / 24.7 / 95%  Osmolality: 304 Pt: 14.7 INR: 1.09 UDS: negative  UA: nitrites, ketones, leuks neg. Granular casts, mod Hgb, 0-5 RBCs, protein >300  Imaging/Diagnostic Tests:  ECHO 09/21/2015 ------------------------------------------------------------------- Study Conclusions  - Left ventricle: The cavity size was normal. Septal wall thickness was increased in a pattern of moderate LVH with severe hypertrophy of the posterior wall. Systolic function was normal. The estimated ejection fraction was in the range of 60% to 65%.   Wall motion was normal; there were no regional wall motion abnormalities. Doppler parameters are consistent with abnormal left ventricular relaxation (grade 1 diastolic dysfunction). Doppler parameters are consistent with indeterminate ventricular filling pressure. - Aortic valve: Transvalvular velocity was within the normal range.There was no stenosis. There was no regurgitation. - Mitral valve: Transvalvular  velocity was within the normal range.There was no evidence for stenosis. There was trivial regurgitation. - Right ventricle: The cavity size was normal. Wall thickness was normal. Systolic function was normal. - Tricuspid valve: There was no regurgitation. - Pulmonic valve: There was no regurgitation.  CATH 09/23/15  Ramus lesion, 90 %stenosed.  Ost 1st Diag to 1st Diag lesion, 95 %stenosed.  Prox LAD lesion, 80 %stenosed.  The left ventricular systolic function is normal.  LV end diastolic pressure is mildly elevated.  The left ventricular ejection fraction is 55-65% by visual estimate.  1. Severe two-vessel coronary arte involving a bifurcation proximal LAD/diagonal 1 and proximal large ramus. 2. Normal LV systolic function with mildly elevated LVEDP.   Garth BignessKathryn Timberlake, MD 09/28/2015, 11:15 AM PGY-1, [facility] FPTS Intern pager: [phone], text pages welcome

## 2015-09-28 NOTE — Progress Notes (Signed)
Dr. Tyrone SageGerhardt paged about pt's ABG results and parameters during rapid wean. Orders received to put pt back on full support and allow him to rest for another hour or two and try again. Will implement and continue to monitor closely.   Results for Russell [name], Russell [name] (MRN [id number]) as of 09/28/2015 21:24  Ref. Range 09/28/2015 20:49  Sample type Unknown ARTERIAL  pH, Arterial Latest Ref Range: 7.350 - 7.450  7.255 (L)  pCO2 arterial Latest Ref Range: 32.0 - 48.0 mmHg 53.7 (H)  pO2, Arterial Latest Ref Range: 83.0 - 108.0 mmHg 65.0 (L)  TCO2 Latest Ref Range: 0 - 100 mmol/L 25  Acid-base deficit Latest Ref Range: 0.0 - 2.0 mmol/L 4.0 (H)  Bicarbonate Latest Ref Range: 20.0 - 28.0 mmol/L 23.7  O2 Saturation Latest Units: % 88.0  Patient temperature Unknown 37.5 C

## 2015-09-28 NOTE — Procedures (Signed)
Extubation Procedure Note  Patient Details:   Name: Fonnie BirkenheadSteven B King DOB: 1970-02-17 MRN: [id number]   Airway Documentation:     Evaluation  O2 sats: stable throughout Complications: No apparent complications Patient did tolerate procedure well. Bilateral Breath Sounds: Rhonchi   Yes  Pt. Extubated per Rapid wean protocol. Pt. Performed -40 on the NIF and .9L on the vital capacity. Pt. Also was able to state his name after extubation. Pt. Had a positive cuff leak. Pt. Placed on 4L G[place] after extubation. No issues at this time.  Adela PortsBrown, Dustyn Dansereau Henry 09/28/2015, 11:36 PM

## 2015-09-28 NOTE — Progress Notes (Signed)
Advanced ET tube from 22 to 27 cm at lip per initial x-ray.  Another x-ray to be taken to confirm location.

## 2015-09-28 NOTE — Progress Notes (Signed)
Patient was already taken down for his CABG when I checked on him this morning. Further management transferred to cards post-op.

## 2015-09-28 NOTE — Transfer of Care (Signed)
Immediate Anesthesia Transfer of Care Note  Patient: Fonnie BirkenheadSteven B Awe  Procedure(s) Performed: Procedure(s): CORONARY ARTERY BYPASS GRAFTING (CABG) x 3 UTILIZING LEFT MAMMARY ARTERY AND ENDOSCOPICALLY HARVESTED LEFT GREATER SAPHENOUS VEIN. (N/A) TRANSESOPHAGEAL ECHOCARDIOGRAM (TEE) (N/A) ENDOVEIN HARVEST OF GREATER SAPHENOUS VEIN (Left)  Patient Location: SICU  Anesthesia Type:General  Level of Consciousness: sedated, responds to stimulation and Patient remains intubated per anesthesia plan  Airway & Oxygen Therapy: Patient remains intubated per anesthesia plan and Patient placed on Ventilator (see vital sign flow sheet for setting)  Post-op Assessment: Report given to RN, Post -op Vital signs reviewed and stable and Patient moving all extremities  Post vital signs: Reviewed and stable  Last Vitals:  Vitals:   09/27/15 2026 09/28/15 0513  BP: (!) 103/55 109/70  Pulse: 74 67  Resp: 18 18  Temp: 36.9 C 36.7 C    Last Pain:  Vitals:   09/28/15 0513  TempSrc: Oral  PainSc:       Patients Stated Pain Goal: 0 (09/27/15 1957)  Complications: No apparent anesthesia complications

## 2015-09-29 ENCOUNTER — Encounter (HOSPITAL_COMMUNITY): Payer: Self-pay | Admitting: Cardiothoracic Surgery

## 2015-09-29 ENCOUNTER — Inpatient Hospital Stay (HOSPITAL_COMMUNITY): Payer: Medicare Other

## 2015-09-29 LAB — POCT I-STAT 3, ART BLOOD GAS (G3+)
ACID-BASE DEFICIT: 2 mmol/L (ref 0.0–2.0)
ACID-BASE DEFICIT: 4 mmol/L — AB (ref 0.0–2.0)
Acid-base deficit: 5 mmol/L — ABNORMAL HIGH (ref 0.0–2.0)
Acid-base deficit: 2 mmol/L (ref 0.0–2.0)
Bicarbonate: 21.7 mmol/L (ref 20.0–28.0)
Bicarbonate: 22.9 mmol/L (ref 20.0–28.0)
Bicarbonate: 23.1 mmol/L (ref 20.0–28.0)
Bicarbonate: 23.8 mmol/L (ref 20.0–28.0)
O2 SAT: 82 %
O2 SAT: 89 %
O2 SAT: 89 %
O2 Saturation: 90 %
PCO2 ART: 41.1 mmHg (ref 32.0–48.0)
PH ART: 7.369 (ref 7.350–7.450)
PO2 ART: 57 mmHg — AB (ref 83.0–108.0)
Patient temperature: 36
TCO2: 23 mmol/L (ref 0–100)
TCO2: 24 mmol/L (ref 0–100)
TCO2: 25 mmol/L (ref 0–100)
TCO2: 25 mmol/L (ref 0–100)
pCO2 arterial: 39.1 mmHg (ref 32.0–48.0)
pCO2 arterial: 41.8 mmHg (ref 32.0–48.0)
pCO2 arterial: 55.3 mmHg — ABNORMAL HIGH (ref 32.0–48.0)
pH, Arterial: 7.226 — ABNORMAL LOW (ref 7.350–7.450)
pH, Arterial: 7.347 — ABNORMAL LOW (ref 7.350–7.450)
pH, Arterial: 7.35 (ref 7.350–7.450)
pO2, Arterial: 56 mmHg — ABNORMAL LOW (ref 83.0–108.0)
pO2, Arterial: 59 mmHg — ABNORMAL LOW (ref 83.0–108.0)
pO2, Arterial: 59 mmHg — ABNORMAL LOW (ref 83.0–108.0)

## 2015-09-29 LAB — CBC
HCT: 28.2 % — ABNORMAL LOW (ref 39.0–52.0)
HEMATOCRIT: 30.9 % — AB (ref 39.0–52.0)
HEMOGLOBIN: 9.2 g/dL — AB (ref 13.0–17.0)
Hemoglobin: 9.9 g/dL — ABNORMAL LOW (ref 13.0–17.0)
MCH: 28.2 pg (ref 26.0–34.0)
MCH: 28 pg (ref 26.0–34.0)
MCHC: 32 g/dL (ref 30.0–36.0)
MCHC: 32.6 g/dL (ref 30.0–36.0)
MCV: 88 fL (ref 78.0–100.0)
MCV: 86 fL (ref 78.0–100.0)
Platelets: 224 10*3/uL (ref 150–400)
Platelets: 184 10*3/uL (ref 150–400)
RBC: 3.51 MIL/uL — ABNORMAL LOW (ref 4.22–5.81)
RBC: 3.28 MIL/uL — ABNORMAL LOW (ref 4.22–5.81)
RDW: 16.1 % — AB (ref 11.5–15.5)
RDW: 16 % — AB (ref 11.5–15.5)
WBC: 14.5 10*3/uL — AB (ref 4.0–10.5)
WBC: 13.7 10*3/uL — ABNORMAL HIGH (ref 4.0–10.5)

## 2015-09-29 LAB — POCT I-STAT, CHEM 8
BUN: 60 mg/dL — ABNORMAL HIGH (ref 6–20)
BUN: 19 mg/dL (ref 6–20)
CALCIUM ION: 1.1 mmol/L — AB (ref 1.15–1.40)
CHLORIDE: 97 mmol/L — AB (ref 101–111)
CREATININE: 3.5 mg/dL — AB (ref 0.61–1.24)
Calcium, Ion: 1.13 mmol/L — ABNORMAL LOW (ref 1.15–1.40)
Chloride: 99 mmol/L — ABNORMAL LOW (ref 101–111)
Creatinine, Ser: 8.8 mg/dL — ABNORMAL HIGH (ref 0.61–1.24)
GLUCOSE: 117 mg/dL — AB (ref 65–99)
Glucose, Bld: 136 mg/dL — ABNORMAL HIGH (ref 65–99)
HCT: 28 % — ABNORMAL LOW (ref 39.0–52.0)
HEMATOCRIT: 29 % — AB (ref 39.0–52.0)
HEMOGLOBIN: 9.9 g/dL — AB (ref 13.0–17.0)
Hemoglobin: 9.5 g/dL — ABNORMAL LOW (ref 13.0–17.0)
POTASSIUM: 5.7 mmol/L — AB (ref 3.5–5.1)
Potassium: 3.5 mmol/L (ref 3.5–5.1)
SODIUM: 133 mmol/L — AB (ref 135–145)
SODIUM: 134 mmol/L — AB (ref 135–145)
TCO2: 25 mmol/L (ref 0–100)
TCO2: 27 mmol/L (ref 0–100)

## 2015-09-29 LAB — GLUCOSE, CAPILLARY
GLUCOSE-CAPILLARY: 171 mg/dL — AB (ref 65–99)
GLUCOSE-CAPILLARY: 133 mg/dL — AB (ref 65–99)
GLUCOSE-CAPILLARY: 131 mg/dL — AB (ref 65–99)
GLUCOSE-CAPILLARY: 131 mg/dL — AB (ref 65–99)
GLUCOSE-CAPILLARY: 133 mg/dL — AB (ref 65–99)
GLUCOSE-CAPILLARY: 123 mg/dL — AB (ref 65–99)
GLUCOSE-CAPILLARY: 97 mg/dL (ref 65–99)
GLUCOSE-CAPILLARY: 116 mg/dL — AB (ref 65–99)
Glucose-Capillary: 118 mg/dL — ABNORMAL HIGH (ref 65–99)
Glucose-Capillary: 123 mg/dL — ABNORMAL HIGH (ref 65–99)
Glucose-Capillary: 124 mg/dL — ABNORMAL HIGH (ref 65–99)
Glucose-Capillary: 126 mg/dL — ABNORMAL HIGH (ref 65–99)
Glucose-Capillary: 129 mg/dL — ABNORMAL HIGH (ref 65–99)
Glucose-Capillary: 132 mg/dL — ABNORMAL HIGH (ref 65–99)
Glucose-Capillary: 134 mg/dL — ABNORMAL HIGH (ref 65–99)
Glucose-Capillary: 136 mg/dL — ABNORMAL HIGH (ref 65–99)
Glucose-Capillary: 155 mg/dL — ABNORMAL HIGH (ref 65–99)
Glucose-Capillary: 157 mg/dL — ABNORMAL HIGH (ref 65–99)
Glucose-Capillary: 184 mg/dL — ABNORMAL HIGH (ref 65–99)

## 2015-09-29 LAB — HEMOGLOBIN A1C
Hgb A1c MFr Bld: 12.5 % — ABNORMAL HIGH (ref 4.8–5.6)
Mean Plasma Glucose: 312 mg/dL

## 2015-09-29 LAB — BASIC METABOLIC PANEL
ANION GAP: 16 — AB (ref 5–15)
BUN: 48 mg/dL — ABNORMAL HIGH (ref 6–20)
CALCIUM: 8.9 mg/dL (ref 8.9–10.3)
CHLORIDE: 96 mmol/L — AB (ref 101–111)
CO2: 21 mmol/L — AB (ref 22–32)
CREATININE: 7.66 mg/dL — AB (ref 0.61–1.24)
GFR calc Af Amer: 9 mL/min — ABNORMAL LOW (ref >60)
GFR, EST NON AFRICAN AMERICAN: 8 mL/min — AB (ref >60)
GLUCOSE: 165 mg/dL — AB (ref 65–99)
Potassium: 6.2 mmol/L — ABNORMAL HIGH (ref 3.5–5.1)
Sodium: 133 mmol/L — ABNORMAL LOW (ref 135–145)

## 2015-09-29 LAB — CREATININE, SERUM
CREATININE: 3.39 mg/dL — AB (ref 0.61–1.24)
GFR calc Af Amer: 24 mL/min — ABNORMAL LOW (ref >60)
GFR, EST NON AFRICAN AMERICAN: 21 mL/min — AB (ref >60)

## 2015-09-29 LAB — MAGNESIUM
MAGNESIUM: 1.9 mg/dL (ref 1.7–2.4)
Magnesium: 2.1 mg/dL (ref 1.7–2.4)

## 2015-09-29 MED ORDER — INSULIN DETEMIR 100 UNIT/ML ~~LOC~~ SOLN: 20.0000 [IU] | Freq: Every day | SUBCUTANEOUS | Status: DC

## 2015-09-29 MED ORDER — INSULIN DETEMIR 100 UNIT/ML ~~LOC~~ SOLN
20.0000 [IU] | Freq: Every day | SUBCUTANEOUS | Status: DC
  Administered 2015-09-29 – 2015-09-30 (×2): 20 [IU] via SUBCUTANEOUS
  Filled 2015-09-29 (×3): qty 0.2

## 2015-09-29 MED ORDER — INSULIN ASPART 100 UNIT/ML ~~LOC~~ SOLN
0.0000 [IU] | SUBCUTANEOUS | Status: DC
  Administered 2015-09-29 – 2015-09-30 (×2): 2 [IU] via SUBCUTANEOUS
  Administered 2015-09-30: 8 [IU] via SUBCUTANEOUS
  Administered 2015-09-30: 4 [IU] via SUBCUTANEOUS
  Administered 2015-09-30: 12 [IU] via SUBCUTANEOUS
  Administered 2015-09-30: 8 [IU] via SUBCUTANEOUS
  Administered 2015-09-30 (×2): 4 [IU] via SUBCUTANEOUS
  Administered 2015-10-01: 2 [IU] via SUBCUTANEOUS
  Administered 2015-10-01: 12 [IU] via SUBCUTANEOUS
  Administered 2015-10-01 – 2015-10-02 (×2): 4 [IU] via SUBCUTANEOUS
  Administered 2015-10-02 (×2): 2 [IU] via SUBCUTANEOUS
  Administered 2015-10-02: 8 [IU] via SUBCUTANEOUS

## 2015-09-29 MED ORDER — METOCLOPRAMIDE HCL 5 MG/ML IJ SOLN
10.0000 mg | Freq: Three times a day (TID) | INTRAMUSCULAR | Status: DC | PRN
  Administered 2015-09-29 – 2015-10-02 (×5): 10 mg via INTRAVENOUS
  Filled 2015-09-29 (×6): qty 2

## 2015-09-29 MED FILL — Electrolyte-R (PH 7.4) Solution: INTRAVENOUS | Qty: 3000 | Status: AC

## 2015-09-29 MED FILL — Heparin Sodium (Porcine) Inj 1000 Unit/ML: INTRAMUSCULAR | Qty: 10 | Status: AC

## 2015-09-29 MED FILL — Lidocaine HCl IV Inj 20 MG/ML: INTRAVENOUS | Qty: 5 | Status: AC

## 2015-09-29 MED FILL — Heparin Sodium (Porcine) Inj 1000 Unit/ML: INTRAMUSCULAR | Qty: 30 | Status: AC

## 2015-09-29 MED FILL — Sodium Bicarbonate IV Soln 8.4%: INTRAVENOUS | Qty: 50 | Status: AC

## 2015-09-29 MED FILL — Dexmedetomidine HCl in NaCl 0.9% IV Soln 400 MCG/100ML: INTRAVENOUS | Qty: 100 | Status: AC

## 2015-09-29 MED FILL — Mannitol IV Soln 20%: INTRAVENOUS | Qty: 500 | Status: AC

## 2015-09-29 MED FILL — Sodium Chloride IV Soln 0.9%: INTRAVENOUS | Qty: 3000 | Status: AC

## 2015-09-29 NOTE — Progress Notes (Signed)
Dialysis Orders: [facility] MWF 4 hours 450/800 117 kg 2.0 K/2.5 Ca bath. UF profile 4 Heparin 8000 units IV at start of tx Heparin 3000 units IV mid run Mircera 75 mcg IV q 2 weeks (last dose 09/15/15 Has HGB 10.0 09/15/15) Venofer 50 mg IV weekly (last dose 09*131/17 Fe 75 Tsat 29 08/18/15) Calcitriol 0.25 mcg PO q treatment (last PTH 424 08/18/15)  Ca 8.7 C Ca 8.9 Phos 7.6 (08/18/15) Takes Phoslo 667 mg 3 capsules PO TID AC and 2 capsules with snacks.  Assessment/Plan: 1. 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  Pressors coming down.   2. ESRD-MWF - HD today 9/27, will not be extremely aggressive with volume removal but will attempt 2 kg UF 3. Anemia- HGB 9.3 9/24 -Rec'd Aranesp 40 mcg IV 09/20- have increased to 100 9/27; transfuse prn after surgery 4. Secondary hyperparathyroidism- P 6.7/bindersVDRA. -follow labs 5.HTN/volume-  large fluid gains between sessions.  117 kg EDW. 6. Nutrition- Albumin 2.8. PCM in the setting of obesity. Renal/Carb mod diet. Nepro/renal vit 7. DM: per primary. BS 200s 8. Hypothyroidism: per primary   Madelon Lips MD [place] pager [phone]   cell [phone] 09/29/2015, 11:48 AM   Subjective:   Phenyl off, still on dopamine.  K 6.2 this AM . Extubated.  Objective Vitals:   09/29/15 0830 09/29/15 0900 09/29/15 1000 09/29/15 1100  BP: 124/75 114/66 115/66 128/73  Pulse: 71 72 74 78  Resp: 10 15 14 16   Temp:      TempSrc:      SpO2: 100% 97% 96% 97%  Weight:      Height:       Physical Exam General: extubated, says he feels "rough".   Heart: regular, no r/g.   Lungs: rhonchi bilaterally Abdomen: soft NT Extremities: 1+ LE edema Dialysis Access:  Left AVF + bruit   Additional Objective Labs: Basic Metabolic Panel:  Recent Labs Lab 09/26/15 0221 09/27/15 0433 09/27/15 1144 09/28/15 0326  09/28/15 1949 09/29/15 0435 09/29/15 0922  NA 132* 129* 129* 135  < >  135 133* 133*  K 3.8 4.0 5.2* 3.8  < > 4.8 6.2* 5.7*  CL 94* 93* 94* 97*  < > 99* 96* 99*  CO2 24 21* 20* 26  --   --  21*  --   GLUCOSE 245* 258* 213* 135*  < > 110* 165* 136*  BUN 50* 73* 77* 39*  < > 45* 48* 60*  CREATININE 7.79* 9.04* 9.06* 6.32*  < > 7.10* 7.66* 8.80*  CALCIUM 8.7* 8.7* 8.7* 8.5*  --   --  8.9  --   PHOS 6.7* 7.6* 6.9*  --   --   --   --   --   < > = values in this interval not displayed. Liver Function Tests:  Recent Labs Lab 09/26/15 0221 09/27/15 0433 09/27/15 1144  ALBUMIN 3.0* 3.2* 3.4*   No results for input(s): LIPASE, AMYLASE in the last 168 hours. CBC:  Recent Labs Lab 09/27/15 0433 09/28/15 0326  09/28/15 1356 09/28/15 1940 09/28/15 1949 09/29/15 0435 09/29/15 0922  WBC 6.7 7.9  --  13.4* 14.3*  --  14.5*  --   HGB 9.3* 9.5*  < > 9.9*  10.2* 10.5* 10.9* 9.9* 9.9*  HCT 28.4* 30.2*  < > 29.0*  32.8* 32.4* 32.0* 30.9* 29.0*  MCV 86.9 87.8  --  87.7 86.6  --  88.0  --  PLT 271 279  < > 224 230  --  224  --   < > = values in this interval not displayed. Blood Culture No results found for: SDES, SPECREQUEST, CULT, REPTSTATUS  Cardiac Enzymes: No results for input(s): CKTOTAL, CKMB, CKMBINDEX, TROPONINI in the last 168 hours. CBG:  Recent Labs Lab 09/29/15 0648 09/29/15 0804 09/29/15 0905 09/29/15 1004 09/29/15 1102  GLUCAP 171* 157* 133* 131* 131*   Iron Studies: No results for input(s): IRON, TIBC, TRANSFERRIN, FERRITIN in the last 72 hours. Lab Results  Component Value Date   INR 1.33 09/28/2015   INR 1.06 09/28/2015   INR 1.01 09/27/2015   Studies/Results: Dg Chest 2 View  Result Date: 09/27/2015 CLINICAL DATA:  Preoperative chest radiograph for CABG. Initial encounter. EXAM: CHEST  2 VIEW COMPARISON:  Chest radiograph performed 09/20/2015 FINDINGS: The lungs are well-aerated and clear. There is no evidence of focal opacification, pleural effusion or pneumothorax. The heart is normal in size; the mediastinal contour is  within normal limits. No acute osseous abnormalities are seen. IMPRESSION: No acute cardiopulmonary process seen. Electronically Signed   By: Garald Balding M.D.   On: 09/27/2015 19:36   Dg Chest Port 1 View  Result Date: 09/29/2015 CLINICAL DATA:  45 year old male status post CABG. Initial encounter. EXAM: PORTABLE CHEST 1 VIEW COMPARISON:  09/28/2015 and earlier. FINDINGS: Portable AP upright view at 0602 hours. Extubated. Enteric tube removed. Unchanged position of right IJ approach Swan-Ganz catheter. Stable left chest tube. Stable mediastinal tube. Continued low lung volumes but mildly improved since yesterday. No pneumothorax. Stable pulmonary vascularity without overt edema. No pleural effusion is evident. Stable cardiac size and mediastinal contours. IMPRESSION: 1. Extubated and enteric tube removed. Continued low lung volumes but slightly improved. 2.  Otherwise stable lines and tubes. 3. No pneumothorax, pulmonary edema, or areas of worsening ventilation. Electronically Signed   By: Genevie Ann M.D.   On: 09/29/2015 07:42   Dg Chest Port 1 View  Result Date: 09/28/2015 CLINICAL DATA:  Status post CABG.  Swan-Ganz catheter adjustment. EXAM: PORTABLE CHEST 1 VIEW COMPARISON:  09/28/2015 at 1418 hours FINDINGS: Endotracheal tube tip has been advanced to the level of the clavicular heads. Swan-Ganz catheter has been advanced and overlaps the left hilum. An orogastric tube tip reaches the stomach. Thoracic drains remain. Low volume chest with atelectasis. Stable postoperative heart size in this patient with CABG. No effusion or visible pneumothorax. IMPRESSION: 1. Improved endotracheal tube positioning after advancement. The tip is at the clavicular heads. 2. Advanced Swan-Ganz catheter with tip over the left hilum. Looping at the right atrium is resolved. 3. Low volume chest with atelectasis. Electronically Signed   By: Monte Fantasia M.D.   On: 09/28/2015 15:43   Dg Chest Port 1 View  Result Date:  09/28/2015 CLINICAL DATA:  Postop day 0 CABG. EXAM: PORTABLE CHEST 1 VIEW COMPARISON:  09/27/2015, 09/20/2015 and earlier, including CTA chest 07/19/2015. FINDINGS: Endotracheal tube tip below the thoracic inlet approximately 7-8 cm above the carina. Swan-Ganz catheter looped in the right atrium with its tip projected at the expected location of right ventricle. Nasogastric tube courses below the diaphragm into the stomach. Left chest tube in place with no pneumothorax. Atelectasis in the upper lobes and lung bases bilaterally, left greater than right. IMPRESSION: 1. Swan-Ganz catheter looped in the right atrium with its tip at the expected position of the right ventricle. 2. Remaining support apparatus satisfactory. 3. No pneumothorax. 4. Atelectasis involving both lungs, left greater  than right. I telephoned these results at the time of interpretation on 09/28/2015 at 2:26 pm to [facility], the nurse caring for the patient in the ICU, who verbally acknowledged these results. Electronically Signed   By: Evangeline Dakin M.D.   On: 09/28/2015 14:31   Medications: . sodium chloride Stopped (09/29/15 1100)  . sodium chloride Stopped (09/29/15 1100)  . sodium chloride 20 mL/hr at 09/29/15 0150  . dexmedetomidine Stopped (09/28/15 1836)  . DOPamine 6 mcg/kg/min (09/29/15 1100)  . insulin (NOVOLIN-R) infusion 4.3 Units/hr (09/29/15 1100)  . lactated ringers    . lactated ringers    . nitroGLYCERIN Stopped (09/28/15 1445)  . phenylephrine (NEO-SYNEPHRINE) Adult infusion Stopped (09/29/15 0837)   . acetaminophen  1,000 mg Oral Q6H   Or  . acetaminophen (TYLENOL) oral liquid 160 mg/5 mL  1,000 mg Per Tube Q6H  . aspirin EC  325 mg Oral Daily   Or  . aspirin  324 mg Per Tube Daily  . atorvastatin  80 mg Oral q1800  . bisacodyl  10 mg Oral Daily   Or  . bisacodyl  10 mg Rectal Daily  . calcitRIOL  0.25 mcg Oral Q M,W,F-HD  . calcium acetate  2,001 mg Oral TID WC  . cefUROXime (ZINACEF)  IV  1.5 g  Intravenous Q12H  . chlorhexidine gluconate (MEDLINE KIT)  15 mL Mouth Rinse BID  . darbepoetin (ARANESP) injection - DIALYSIS  100 mcg Intravenous Q Wed-HD  . docusate sodium  200 mg Oral Daily  . insulin regular  0-10 Units Intravenous TID WC  . levothyroxine  50 mcg Oral QAC breakfast  . magnesium sulfate  4 g Intravenous Once  . mouth rinse  15 mL Mouth Rinse QID  . metoprolol tartrate  12.5 mg Oral BID   Or  . metoprolol tartrate  12.5 mg Per Tube BID  . multivitamin  1 tablet Oral QHS  . mupirocin ointment  1 application Nasal BID  . [START ON 09/30/2015] pantoprazole  40 mg Oral Daily  . sodium chloride flush  3 mL Intravenous Q12H

## 2015-09-29 NOTE — Progress Notes (Signed)
Dr. Tyrone SageGerhardt paged about pt's ABG results. Orders received to place BIPAP.   Results for Russell BirkenheadSILVER, Prather B (MRN [id number]) as of 09/29/2015 05:37  Ref. Range 09/29/2015 04:45  Sample type Unknown ARTERIAL  pH, Arterial Latest Ref Range: 7.350 - 7.450  7.226 (L)  pCO2 arterial Latest Ref Range: 32.0 - 48.0 mmHg 55.3 (H)  pO2, Arterial Latest Ref Range: 83.0 - 108.0 mmHg 56.0 (L)  TCO2 Latest Ref Range: 0 - 100 mmol/L 25  Acid-base deficit Latest Ref Range: 0.0 - 2.0 mmol/L 5.0 (H)  Bicarbonate Latest Ref Range: 20.0 - 28.0 mmol/L 22.9  O2 Saturation Latest Units: % 82.0  Patient temperature Unknown 37.0 C

## 2015-09-29 NOTE — Progress Notes (Signed)
Dr. Tyrone SageGerhardt paged about pt's second wean attempt and ABG results. Orders received to give one amp of bicarb and continue with extubation. Will implement and continue to monitor closely.

## 2015-09-29 NOTE — Progress Notes (Signed)
TCTS BRIEF SICU PROGRESS NOTE  1 Day Post-Op  S/P Procedure(s) (LRB): CORONARY ARTERY BYPASS GRAFTING (CABG) x 3 UTILIZING LEFT MAMMARY ARTERY AND ENDOSCOPICALLY HARVESTED LEFT GREATER SAPHENOUS VEIN. (N/A) TRANSESOPHAGEAL ECHOCARDIOGRAM (TEE) (N/A) ENDOVEIN HARVEST OF GREATER SAPHENOUS VEIN (Left)   Stable day Tolerating HD, maintaining NSR w/ stable BP off Neo drip, still on low dose dopamine Breathing comfortably w/ O2 sats 93-95% on 6 L/min via [place]  Plan: Continue current plan  Purcell Nailslarence H Owen, MD 09/29/2015 5:30 PM

## 2015-09-29 NOTE — Progress Notes (Signed)
Patient ID: Russell [name], male   DOB: 1970/02/17, 44 y.o.   MRN: [id number] TCTS DAILY ICU PROGRESS NOTE                   [address]            [place] [address]          [phone]   1 Day Post-Op Procedure(s) (LRB): CORONARY ARTERY BYPASS GRAFTING (CABG) x 3 UTILIZING LEFT MAMMARY ARTERY AND ENDOSCOPICALLY HARVESTED LEFT GREATER SAPHENOUS VEIN. (N/A) TRANSESOPHAGEAL ECHOCARDIOGRAM (TEE) (N/A) ENDOVEIN HARVEST OF GREATER SAPHENOUS VEIN (Left)  Total Length of Stay:  LOS: 9 days   Subjective: Extubated , poor respiratory effort,  Objective: Vital signs in last 24 hours: Temp:  [96.1 F (35.6 C)-99.9 F (37.7 C)] 97.8 F (36.6 C) (09/27 1530) Pulse Rate:  [70-92] 91 (09/27 1600) Cardiac Rhythm: Normal sinus rhythm (09/27 1430) Resp:  [10-26] 17 (09/27 1600) BP: (89-133)/(52-85) 133/73 (09/27 1600) SpO2:  [91 %-100 %] 95 % (09/27 1600) Arterial Line BP: (69-145)/(54-98) 104/70 (09/27 1500) FiO2 (%):  [40 %-60 %] 40 % (09/26 2239) Weight:  [275 lb (124.7 kg)-279 lb 15.8 oz (127 kg)] 279 lb 15.8 oz (127 kg) (09/27 1430)  Filed Weights   09/28/15 0513 09/29/15 0500 09/29/15 1430  Weight: 262 lb (118.8 kg) 275 lb (124.7 kg) 279 lb 15.8 oz (127 kg)    Weight change: 2 lb 4.7 oz (1.039 kg)   Hemodynamic parameters for last 24 hours: PAP: (34-50)/(27-45) 43/38 CO:  [6 L/min-7 L/min] 6 L/min CI:  [2.5 L/min/m2-2.9 L/min/m2] 2.5 L/min/m2  Intake/Output from previous day: 09/26 0701 - 09/27 0700 In: 3926.9 [I.V.:2746.9; Blood:630; IV Piggyback:550] Out: 335 [Urine:137; Chest Tube:198]  Intake/Output this shift: Total I/O In: 223.9 [I.V.:223.9] Out: 20 [Chest Tube:20]  Current Meds: Scheduled Meds: . acetaminophen  1,000 mg Oral Q6H   Or  . acetaminophen (TYLENOL) oral liquid 160 mg/5 mL  1,000 mg Per Tube Q6H  . aspirin EC  325 mg Oral Daily   Or  . aspirin  324 mg Per Tube Daily  . atorvastatin  80 mg Oral q1800  . bisacodyl  10 mg Oral  Daily   Or  . bisacodyl  10 mg Rectal Daily  . calcitRIOL  0.25 mcg Oral Q M,W,F-HD  . calcium acetate  2,001 mg Oral TID WC  . cefUROXime (ZINACEF)  IV  1.5 g Intravenous Q12H  . chlorhexidine gluconate (MEDLINE KIT)  15 mL Mouth Rinse BID  . darbepoetin (ARANESP) injection - DIALYSIS  100 mcg Intravenous Q Wed-HD  . docusate sodium  200 mg Oral Daily  . insulin regular  0-10 Units Intravenous TID WC  . levothyroxine  50 mcg Oral QAC breakfast  . magnesium sulfate  4 g Intravenous Once  . mouth rinse  15 mL Mouth Rinse QID  . metoprolol tartrate  12.5 mg Oral BID   Or  . metoprolol tartrate  12.5 mg Per Tube BID  . multivitamin  1 tablet Oral QHS  . mupirocin ointment  1 application Nasal BID  . [START ON 09/30/2015] pantoprazole  40 mg Oral Daily  . sodium chloride flush  3 mL Intravenous Q12H   Continuous Infusions: . sodium chloride Stopped (09/29/15 1100)  . sodium chloride Stopped (09/29/15 1100)  . sodium chloride 20 mL/hr at 09/29/15 0150  . dexmedetomidine Stopped (09/28/15 1836)  . DOPamine 4 mcg/kg/min (09/29/15 1500)  . insulin (NOVOLIN-R) infusion 1.6 Units/hr (09/29/15 1608)  .  lactated ringers    . lactated ringers    . nitroGLYCERIN Stopped (09/28/15 1445)  . phenylephrine (NEO-SYNEPHRINE) Adult infusion Stopped (09/29/15 0837)   PRN Meds:.sodium chloride, lactated ringers, levalbuterol, metoCLOPramide (REGLAN) injection, metoprolol, midazolam, morphine injection, ondansetron (ZOFRAN) IV, oxyCODONE, sodium chloride flush, traMADol  General appearance: alert and cooperative Neurologic: intact Heart: regular rate and rhythm, S1, S2 normal, no murmur, click, rub or gallop Lungs: diminished breath sounds bibasilar Abdomen: soft, non-tender; bowel sounds normal; no masses,  no organomegaly Extremities: extremities normal, atraumatic, no cyanosis or edema and Homans sign is negative, no sign of DVT Wound: sternum stable  Lab Results: CBC: Recent Labs   09/28/15 1940  09/29/15 0435 09/29/15 0922  WBC 14.3*  --  14.5*  --   HGB 10.5*  < > 9.9* 9.9*  HCT 32.4*  < > 30.9* 29.0*  PLT 230  --  224  --   < > = values in this interval not displayed. BMET:  Recent Labs  09/28/15 0326  09/29/15 0435 09/29/15 0922  NA 135  < > 133* 133*  K 3.8  < > 6.2* 5.7*  CL 97*  < > 96* 99*  CO2 26  --  21*  --   GLUCOSE 135*  < > 165* 136*  BUN 39*  < > 48* 60*  CREATININE 6.32*  < > 7.66* 8.80*  CALCIUM 8.5*  --  8.9  --   < > = values in this interval not displayed.  CMET: Lab Results  Component Value Date   WBC 14.5 (H) 09/29/2015   HGB 9.9 (L) 09/29/2015   HCT 29.0 (L) 09/29/2015   PLT 224 09/29/2015   GLUCOSE 136 (H) 09/29/2015   CHOL 203 (H) 09/21/2015   TRIG 294 (H) 09/21/2015   HDL 33 (L) 09/21/2015   LDLCALC 111 (H) 09/21/2015   ALT 15 (L) 09/20/2015   AST 22 09/20/2015   NA 133 (L) 09/29/2015   K 5.7 (H) 09/29/2015   CL 99 (L) 09/29/2015   CREATININE 8.80 (H) 09/29/2015   BUN 60 (H) 09/29/2015   CO2 21 (L) 09/29/2015   TSH 1.243 07/20/2015   INR 1.33 09/28/2015   HGBA1C 12.5 (H) 09/28/2015   MICROALBUR 24.84 (H) 12/09/2012    PT/INR:  Recent Labs  09/28/15 1356  LABPROT 16.6*  INR 1.33   Radiology: Dg Chest Port 1 View  Result Date: 09/29/2015 CLINICAL DATA:  45 year old male status post CABG. Initial encounter. EXAM: PORTABLE CHEST 1 VIEW COMPARISON:  09/28/2015 and earlier. FINDINGS: Portable AP upright view at 0602 hours. Extubated. Enteric tube removed. Unchanged position of right IJ approach Swan-Ganz catheter. Stable left chest tube. Stable mediastinal tube. Continued low lung volumes but mildly improved since yesterday. No pneumothorax. Stable pulmonary vascularity without overt edema. No pleural effusion is evident. Stable cardiac size and mediastinal contours. IMPRESSION: 1. Extubated and enteric tube removed. Continued low lung volumes but slightly improved. 2.  Otherwise stable lines and tubes. 3. No  pneumothorax, pulmonary edema, or areas of worsening ventilation. Electronically Signed   By: Genevie Ann M.D.   On: 09/29/2015 07:42     Assessment/Plan: S/P Procedure(s) (LRB): CORONARY ARTERY BYPASS GRAFTING (CABG) x 3 UTILIZING LEFT MAMMARY ARTERY AND ENDOSCOPICALLY HARVESTED LEFT GREATER SAPHENOUS VEIN. (N/A) TRANSESOPHAGEAL ECHOCARDIOGRAM (TEE) (N/A) ENDOVEIN HARVEST OF GREATER SAPHENOUS VEIN (Left) Mobilize d/c tubes/lines See progression orders On dialysis now for excess fluid and elevated K Some nausea   [name] 09/29/2015 4:17 PM

## 2015-09-29 NOTE — Progress Notes (Signed)
Pt. was not placed on BIPAP due to N/V, despite giving IV Zofran. Dr. Tyrone SageGerhardt was notified and verbalized drawing another ABG. Will implement and continue to monitor closely.

## 2015-09-29 NOTE — Progress Notes (Signed)
Dr. Hyman HopesWebb (Nephrology) was paged about pt's K+ 6.2. Pt will receive dialysis this morning.

## 2015-09-30 ENCOUNTER — Inpatient Hospital Stay (HOSPITAL_COMMUNITY): Payer: Medicare Other

## 2015-09-30 LAB — GLUCOSE, CAPILLARY
GLUCOSE-CAPILLARY: 261 mg/dL — AB (ref 65–99)
GLUCOSE-CAPILLARY: 267 mg/dL — AB (ref 65–99)
GLUCOSE-CAPILLARY: 235 mg/dL — AB (ref 65–99)
GLUCOSE-CAPILLARY: 183 mg/dL — AB (ref 65–99)
GLUCOSE-CAPILLARY: 141 mg/dL — AB (ref 65–99)
Glucose-Capillary: 186 mg/dL — ABNORMAL HIGH (ref 65–99)

## 2015-09-30 LAB — CBC
HCT: 26.6 % — ABNORMAL LOW (ref 39.0–52.0)
Hemoglobin: 8.5 g/dL — ABNORMAL LOW (ref 13.0–17.0)
MCH: 28.1 pg (ref 26.0–34.0)
MCHC: 32 g/dL (ref 30.0–36.0)
MCV: 87.8 fL (ref 78.0–100.0)
Platelets: 170 10*3/uL (ref 150–400)
RBC: 3.03 MIL/uL — ABNORMAL LOW (ref 4.22–5.81)
RDW: 16.3 % — ABNORMAL HIGH (ref 11.5–15.5)
WBC: 12.3 10*3/uL — ABNORMAL HIGH (ref 4.0–10.5)

## 2015-09-30 LAB — COMPREHENSIVE METABOLIC PANEL
ALT: 22 U/L (ref 17–63)
AST: 25 U/L (ref 15–41)
Albumin: 3.1 g/dL — ABNORMAL LOW (ref 3.5–5.0)
Alkaline Phosphatase: 57 U/L (ref 38–126)
Anion gap: 13 (ref 5–15)
BUN: 32 mg/dL — ABNORMAL HIGH (ref 6–20)
CO2: 25 mmol/L (ref 22–32)
Calcium: 8.4 mg/dL — ABNORMAL LOW (ref 8.9–10.3)
Chloride: 95 mmol/L — ABNORMAL LOW (ref 101–111)
Creatinine, Ser: 5.55 mg/dL — ABNORMAL HIGH (ref 0.61–1.24)
GFR calc Af Amer: 13 mL/min — ABNORMAL LOW (ref >60)
GFR calc non Af Amer: 11 mL/min — ABNORMAL LOW (ref >60)
Glucose, Bld: 280 mg/dL — ABNORMAL HIGH (ref 65–99)
Potassium: 4.6 mmol/L (ref 3.5–5.1)
Sodium: 133 mmol/L — ABNORMAL LOW (ref 135–145)
Total Bilirubin: 0.7 mg/dL (ref 0.3–1.2)
Total Protein: 6.2 g/dL — ABNORMAL LOW (ref 6.5–8.1)

## 2015-09-30 MED ORDER — PENTAFLUOROPROP-TETRAFLUOROETH EX AERO: 1.0000 "application " | INHALATION_SPRAY | CUTANEOUS | Status: DC | PRN

## 2015-09-30 MED ORDER — LIDOCAINE-PRILOCAINE 2.5-2.5 % EX CREA
1.0000 "application " | TOPICAL_CREAM | CUTANEOUS | Status: DC | PRN
  Filled 2015-09-30: qty 5

## 2015-09-30 MED ORDER — HEPARIN SODIUM (PORCINE) 1000 UNIT/ML DIALYSIS
5000.0000 [IU] | INTRAMUSCULAR | Status: DC | PRN
  Filled 2015-09-30: qty 5

## 2015-09-30 MED ORDER — ENOXAPARIN SODIUM 30 MG/0.3ML ~~LOC~~ SOLN
30.0000 mg | SUBCUTANEOUS | Status: DC
  Administered 2015-09-30 – 2015-10-04 (×5): 30 mg via SUBCUTANEOUS
  Filled 2015-09-30 (×5): qty 0.3

## 2015-09-30 MED ORDER — ALTEPLASE 2 MG IJ SOLR: 2.0000 mg | Freq: Once | INTRAMUSCULAR | Status: DC | PRN

## 2015-09-30 MED ORDER — HEPARIN SODIUM (PORCINE) 1000 UNIT/ML DIALYSIS: 1000.0000 [IU] | INTRAMUSCULAR | Status: DC | PRN

## 2015-09-30 MED ORDER — SODIUM CHLORIDE 0.9 % IV SOLN: 100.0000 mL | INTRAVENOUS | Status: DC | PRN

## 2015-09-30 MED ORDER — LIDOCAINE HCL (PF) 1 % IJ SOLN: 5.0000 mL | INTRAMUSCULAR | Status: DC | PRN

## 2015-09-30 NOTE — Progress Notes (Signed)
Inpatient Diabetes Program Recommendations  AACE/ADA: New Consensus Statement on Inpatient Glycemic Control (2015)  Target Ranges:  Prepandial:   less than 140 mg/dL      Peak postprandial:   less than 180 mg/dL (1-2 hours)      Critically ill patients:  140 - 180 mg/dL   Lab Results  Component Value Date   GLUCAP 267 (H) 09/30/2015   HGBA1C 12.5 (H) 09/28/2015    Review of Glycemic Control:  Results for Russell [name], Russell [name] (MRN [id number]) as of 09/30/2015 11:25  Ref. Range 09/29/2015 16:04 09/29/2015 17:08 09/29/2015 18:01 09/29/2015 18:59 09/29/2015 19:30 09/29/2015 23:51 09/30/2015 04:02 09/30/2015 07:56  Glucose-Capillary Latest Ref Range: 65 - 99 mg/dL 97 409118 (H) 811116 (H) 914126 (H) 124 (H) 184 (H) 261 (H) 267 (H)    Diabetes history: Type2 diabetes Outpatient Diabetes medications: Lantus 60 units bid, Humalog 15-30 units q evening Current orders for Inpatient glycemic control:  TCTS q 4 hours, Levemir 20 units daily  Inpatient Diabetes Program Recommendations:   Please consider increasing Levemir to 30 units bid.  This is approximately 1/2 of home dose of basal insulin.  Also please consider adding Novolog meal coverage 4 units tid with meals. Discussed with RN.   Thanks, Beryl MeagerJenny Kaitrin Seybold, RN, BC-ADM Inpatient Diabetes Coordinator Pager [phone] (8a-5p)

## 2015-09-30 NOTE — Progress Notes (Signed)
Patient ID: Russell [name], male   DOB: [birth year], 45 y.o.   MRN: [id number] TCTS DAILY ICU PROGRESS NOTE                   [address]            [place] [id number]          [phone]   2 Days Post-Op Procedure(s) (LRB): CORONARY ARTERY BYPASS GRAFTING (CABG) x 3 UTILIZING LEFT MAMMARY ARTERY AND ENDOSCOPICALLY HARVESTED LEFT GREATER SAPHENOUS VEIN. (N/A) TRANSESOPHAGEAL ECHOCARDIOGRAM (TEE) (N/A) ENDOVEIN HARVEST OF GREATER SAPHENOUS VEIN (Left)  Total Length of Stay:  LOS: 10 days   Subjective: Alert , nausea better   Objective: Vital signs in last 24 hours: Temp:  [96.1 F (35.6 C)-98.8 F (37.1 C)] 97.9 F (36.6 C) (09/28 0759) Pulse Rate:  [71-99] 97 (09/28 0700) Cardiac Rhythm: Normal sinus rhythm (09/28 0500) Resp:  [10-26] 17 (09/28 0700) BP: (99-156)/(52-97) 107/97 (09/28 0700) SpO2:  [91 %-100 %] 100 % (09/28 0700) Arterial Line BP: (79-197)/(62-78) 164/62 (09/28 0700) Weight:  [261 lb 14.5 oz (118.8 kg)-279 lb 15.8 oz (127 kg)] 261 lb 14.5 oz (118.8 kg) (09/28 0500)  Filed Weights   09/29/15 0500 09/29/15 1430 09/30/15 0500  Weight: 275 lb (124.7 kg) 279 lb 15.8 oz (127 kg) 261 lb 14.5 oz (118.8 kg)    Weight change: 4 lb 15.8 oz (2.261 kg)   Hemodynamic parameters for last 24 hours:    Intake/Output from previous day: 09/27 0701 - 09/28 0700 In: 589.8 [I.V.:589.8] Out: 2060 [Chest Tube:60]  Intake/Output this shift: No intake/output data recorded.  Current Meds: Scheduled Meds: . acetaminophen  1,000 mg Oral Q6H   Or  . acetaminophen (TYLENOL) oral liquid 160 mg/5 mL  1,000 mg Per Tube Q6H  . aspirin EC  325 mg Oral Daily   Or  . aspirin  324 mg Per Tube Daily  . atorvastatin  80 mg Oral q1800  . bisacodyl  10 mg Oral Daily   Or  . bisacodyl  10 mg Rectal Daily  . calcitRIOL  0.25 mcg Oral Q M,W,F-HD  . calcium acetate  2,001 mg Oral TID WC  . chlorhexidine gluconate (MEDLINE KIT)  15 mL Mouth Rinse BID  . darbepoetin  (ARANESP) injection - DIALYSIS  100 mcg Intravenous Q Wed-HD  . docusate sodium  200 mg Oral Daily  . insulin aspart  0-24 Units Subcutaneous Q4H  . insulin detemir  20 Units Subcutaneous Daily  . insulin regular  0-10 Units Intravenous TID WC  . levothyroxine  50 mcg Oral QAC breakfast  . magnesium sulfate  4 g Intravenous Once  . mouth rinse  15 mL Mouth Rinse QID  . metoprolol tartrate  12.5 mg Oral BID   Or  . metoprolol tartrate  12.5 mg Per Tube BID  . multivitamin  1 tablet Oral QHS  . mupirocin ointment  1 application Nasal BID  . pantoprazole  40 mg Oral Daily  . sodium chloride flush  3 mL Intravenous Q12H   Continuous Infusions: . sodium chloride Stopped (09/29/15 1100)  . sodium chloride 250 mL (09/30/15 0700)  . sodium chloride 20 mL/hr at 09/29/15 0150  . dexmedetomidine Stopped (09/28/15 1836)  . DOPamine 2 mcg/kg/min (09/30/15 0700)  . insulin (NOVOLIN-R) infusion Stopped (09/29/15 2000)  . lactated ringers    . lactated ringers    . nitroGLYCERIN Stopped (09/28/15 1445)  . phenylephrine (NEO-SYNEPHRINE) Adult infusion Stopped (09/29/15  0837)   PRN Meds:.sodium chloride, lactated ringers, levalbuterol, metoCLOPramide (REGLAN) injection, metoprolol, midazolam, morphine injection, ondansetron (ZOFRAN) IV, oxyCODONE, sodium chloride flush, traMADol  General appearance: alert, cooperative and no distress Neurologic: intact Heart: regular rate and rhythm, S1, S2 normal, no murmur, click, rub or gallop Lungs: diminished breath sounds bibasilar Abdomen: soft, non-tender; bowel sounds normal; no masses,  no organomegaly Extremities: extremities normal, atraumatic, no cyanosis or edema and Homans sign is negative, no sign of DVT Wound: sternum stable  Lab Results: CBC: Recent Labs  09/29/15 1916 09/30/15 0549  WBC 13.7* 12.3*  HGB 9.2* 8.5*  HCT 28.2* 26.6*  PLT 184 170   BMET:  Recent Labs  09/29/15 0435  09/29/15 1902 09/29/15 1916 09/30/15 0549  NA  133*  < > 134*  --  133*  K 6.2*  < > 3.5  --  4.6  CL 96*  < > 97*  --  95*  CO2 21*  --   --   --  25  GLUCOSE 165*  < > 117*  --  280*  BUN 48*  < > 19  --  32*  CREATININE 7.66*  < > 3.50* 3.39* 5.55*  CALCIUM 8.9  --   --   --  8.4*  < > = values in this interval not displayed.  CMET: Lab Results  Component Value Date   WBC 12.3 (H) 09/30/2015   HGB 8.5 (L) 09/30/2015   HCT 26.6 (L) 09/30/2015   PLT 170 09/30/2015   GLUCOSE 280 (H) 09/30/2015   CHOL 203 (H) 09/21/2015   TRIG 294 (H) 09/21/2015   HDL 33 (L) 09/21/2015   LDLCALC 111 (H) 09/21/2015   ALT 22 09/30/2015   AST 25 09/30/2015   NA 133 (L) 09/30/2015   K 4.6 09/30/2015   CL 95 (L) 09/30/2015   CREATININE 5.55 (H) 09/30/2015   BUN 32 (H) 09/30/2015   CO2 25 09/30/2015   TSH 1.243 07/20/2015   INR 1.33 09/28/2015   HGBA1C 12.5 (H) 09/28/2015   MICROALBUR 24.84 (H) 12/09/2012    PT/INR:  Recent Labs  09/28/15 1356  LABPROT 16.6*  INR 1.33   Radiology: Dg Chest Port 1 View  Result Date: 09/30/2015 CLINICAL DATA:  Chest tube in place. EXAM: PORTABLE CHEST 1 VIEW COMPARISON:  09/29/2015 FINDINGS: Pulmonary artery catheter has been removed. The right jugular central line is still present. Mediastinal drain has been removed. A left chest tube is still present. There are low lung volumes with vascular crowding and atelectasis. Negative for a pneumothorax. Stable appearance of the heart and mediastinum. Median sternotomy wires are noted. IMPRESSION: Low lung volumes. Left chest tube without pneumothorax. Electronically Signed   By: Markus Daft M.D.   On: 09/30/2015 07:36     Assessment/Plan: S/P Procedure(s) (LRB): CORONARY ARTERY BYPASS GRAFTING (CABG) x 3 UTILIZING LEFT MAMMARY ARTERY AND ENDOSCOPICALLY HARVESTED LEFT GREATER SAPHENOUS VEIN. (N/A) TRANSESOPHAGEAL ECHOCARDIOGRAM (TEE) (N/A) ENDOVEIN HARVEST OF GREATER SAPHENOUS VEIN (Left) Mobilize Diabetes control d/c tubes/lines     [name] 09/30/2015 8:07 AM

## 2015-09-30 NOTE — Evaluation (Signed)
Physical Therapy Evaluation Patient Details Name: Russell [name] MRN: [id number] DOB: Oct 16, 1970 Today's Date: 09/30/2015   History of Present Illness  45 yo admitted with NSTEMI s/p CABGx 3. PMHx: ESRD on MWF, DM, HTN, anemia, neuropathy, GERD, depression  Clinical Impression  Pt pleasant in chair and able to state 3/5 precautions on arrival. Pt educated for all precautions, limitations with transfers, gait and functional mobility. Pt with decreased strength, function and gait who will benefit from acute therapy to maximize strength, function and mobility to decrease burden of care.   HR 96-98 sats 95% on 6L with gait BP 174/70 art line before 138/118 BP cuff after    Follow Up Recommendations Home health PT;Supervision/Assistance - 24 hour    Equipment Recommendations  Rolling walker with 5" wheels;3in1 (PT)    Recommendations for Other Services OT consult     Precautions / Restrictions Precautions Precautions: Fall;Sternal Precaution Comments: chest tube, external pacer Restrictions Weight Bearing Restrictions: Yes (sternal precautions)      Mobility  Bed Mobility               General bed mobility comments: in chair on arrival  Transfers Overall transfer level: Needs assistance   Transfers: Sit to/from Stand Sit to Stand: Min assist;+2 physical assistance         General transfer comment: cues for hand placement, anterior translation and rise from surface  Ambulation/Gait Ambulation/Gait assistance: Min assist;+2 safety/equipment Ambulation Distance (Feet): 35 Feet Assistive device: Rolling walker (2 wheeled) Gait Pattern/deviations: Step-through pattern;Decreased stride length;Trunk flexed   Gait velocity interpretation: Below normal speed for age/gender General Gait Details: cues for posture, position in RW, breathing technique and chair to follow. Pt limited by fatigue and nausea  Stairs            Wheelchair Mobility    Modified Rankin  (Stroke Patients Only)       Balance Overall balance assessment: Needs assistance           Standing balance-Leahy Scale: Poor                               Pertinent Vitals/Pain Pain Assessment: 0-10 Pain Score: 3  Pain Location: chest  Pain Descriptors / Indicators: Sore Pain Intervention(s): Limited activity within patient's tolerance;Monitored during session;Repositioned    Home Living Family/patient expects to be discharged to:: Private residence Living Arrangements: Spouse/significant other Available Help at Discharge: Family;Available 24 hours/day Type of Home: House Home Access: Stairs to enter   [employer] of Steps: 3 Home Layout: Two level;Able to live on main level with bedroom/bathroom Home Equipment: None      Prior Function Level of Independence: Independent               Hand Dominance        Extremity/Trunk Assessment   Upper Extremity Assessment: Overall WFL for tasks assessed           Lower Extremity Assessment: Generalized weakness      Cervical / Trunk Assessment: Normal  Communication   Communication: No difficulties  Cognition Arousal/Alertness: Awake/alert Behavior During Therapy: Flat affect Overall Cognitive Status: Within Functional Limits for tasks assessed                      General Comments      Exercises     Assessment/Plan    PT Assessment Patient needs continued PT services  PT Problem  List Decreased strength;Decreased mobility;Decreased activity tolerance;Cardiopulmonary status limiting activity;Pain;Decreased knowledge of use of DME;Decreased balance          PT Treatment Interventions Gait training;Stair training;Therapeutic exercise;Patient/family education;Therapeutic activities;DME instruction;Functional mobility training    PT Goals (Current goals can be found in the Care Plan section)  Acute Rehab PT Goals Patient Stated Goal: return home PT Goal  Formulation: With patient Time For Goal Achievement: 10/14/15 Potential to Achieve Goals: Fair    Frequency Min 3X/week   Barriers to discharge Decreased caregiver support Mother to stay with pt at D/C    Co-evaluation               End of Session Equipment Utilized During Treatment: Gait belt;Oxygen Activity Tolerance: Patient tolerated treatment well Patient left: in chair;with call bell/phone within reach;with nursing/sitter in room Nurse Communication: Mobility status;Precautions         Time: [id number] PT Time Calculation (min) (ACUTE ONLY): 17 min   Charges:   PT Evaluation $PT Eval Moderate Complexity: 1 Procedure     PT G CodesDelorse [name]:        Tabor, Dawon Troop Beth 09/30/2015, 11:41 AM Delaney MeigsMaija Tabor Mashanda Ishibashi, PT [phone]

## 2015-09-30 NOTE — Care Management Important Message (Signed)
Important Message  Patient Details  Name: Russell BirkenheadSteven B Fontanella MRN: [id number] Date of Birth: 12-12-1970   Medicare Important Message Given:  Yes    Cree Kunert Stefan ChurchBratton 09/30/2015, 1:31 PM

## 2015-09-30 NOTE — Progress Notes (Signed)
Dialysis Orders: [facility] MWF 4 hours 450/800 117 kg 2.0 K/2.5 Ca bath. UF profile 4 Heparin 8000 units IV at start of tx Heparin 3000 units IV mid run Mircera 75 mcg IV q 2 weeks (last dose 09/15/15 Has HGB 10.0 09/15/15) Venofer 50 mg IV weekly (last dose 09*131/17 Fe 75 Tsat 29 08/18/15) Calcitriol 0.25 mcg PO q treatment (last PTH 424 08/18/15)  Ca 8.7 C Ca 8.9 Phos 7.6 (08/18/15) Takes Phoslo 667 mg 3 capsules PO TID AC and 2 capsules with snacks.  Assessment/Plan: 1. 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  Plan to wean dopamine today.  Having nausea. 2.  Hypoxia: extubated, not real great air movement.  Will attempt vol removal tomorrow as below 2. ESRD-MWF - next HD 9/29, will increase UF as pt over EDW 3. Anemia- HGB 9.3 9/24 -Rec'd Aranesp 40 mcg IV 09/20- have increased to 100 9/27; transfuse prn after surgery 4. Secondary hyperparathyroidism- P 6.7/bindersVDRA. -follow labs 5.HTN/volume-  large fluid gains between sessions.  117 kg EDW. 6. Nutrition- Albumin 2.8. PCM in the setting of obesity. Renal/Carb mod diet. Nepro/renal vit 7. DM: per primary. BS 200s 8. Hypothyroidism: per primary   Madelon Lips MD [facility] pager [phone]   cell [phone] 09/30/2015, 10:49 AM   Subjective:   Nauseated.  Sitting in chair.   Objective Vitals:   09/30/15 0759 09/30/15 0800 09/30/15 0900 09/30/15 1000  BP:  (!) 150/79  123/75  Pulse:  98 96 93  Resp:  18 18 17   Temp: 97.9 F (36.6 C)     TempSrc: Oral     SpO2:  97% 97% 100%  Weight:      Height:       Physical Exam General: sitting in chair, emesis basin at bedside.   Heart: regular, no r/g.   Lungs: poor air movement bilaterally Abdomen: soft Nt, becoming distended. Extremities: 1+ LE edema Dialysis Access:  Left AVF + bruit   Additional Objective Labs: Basic Metabolic Panel:  Recent Labs Lab 09/26/15 0221 09/27/15 0433 09/27/15 1144  09/28/15 0326  09/29/15 0435 09/29/15 0922 09/29/15 1902 09/29/15 1916 09/30/15 0549  NA 132* 129* 129* 135  < > 133* 133* 134*  --  133*  K 3.8 4.0 5.2* 3.8  < > 6.2* 5.7* 3.5  --  4.6  CL 94* 93* 94* 97*  < > 96* 99* 97*  --  95*  CO2 24 21* 20* 26  --  21*  --   --   --  25  GLUCOSE 245* 258* 213* 135*  < > 165* 136* 117*  --  280*  BUN 50* 73* 77* 39*  < > 48* 60* 19  --  32*  CREATININE 7.79* 9.04* 9.06* 6.32*  < > 7.66* 8.80* 3.50* 3.39* 5.55*  CALCIUM 8.7* 8.7* 8.7* 8.5*  --  8.9  --   --   --  8.4*  PHOS 6.7* 7.6* 6.9*  --   --   --   --   --   --   --   < > = values in this interval not displayed. Liver Function Tests:  Recent Labs Lab 09/27/15 0433 09/27/15 1144 09/30/15 0549  AST  --   --  25  ALT  --   --  22  ALKPHOS  --   --  57  BILITOT  --   --  0.7  PROT  --   --  6.2*  ALBUMIN 3.2* 3.4* 3.1*   No results for input(s): LIPASE, AMYLASE in the last 168 hours. CBC:  Recent Labs Lab 09/28/15 1356 09/28/15 1940  09/29/15 0435  09/29/15 1902 09/29/15 1916 09/30/15 0549  WBC 13.4* 14.3*  --  14.5*  --   --  13.7* 12.3*  HGB 9.9*  10.2* 10.5*  < > 9.9*  < > 9.5* 9.2* 8.5*  HCT 29.0*  32.8* 32.4*  < > 30.9*  < > 28.0* 28.2* 26.6*  MCV 87.7 86.6  --  88.0  --   --  86.0 87.8  PLT 224 230  --  224  --   --  184 170  < > = values in this interval not displayed. Blood Culture No results found for: SDES, SPECREQUEST, CULT, REPTSTATUS  Cardiac Enzymes: No results for input(s): CKTOTAL, CKMB, CKMBINDEX, TROPONINI in the last 168 hours. CBG:  Recent Labs Lab 09/29/15 1859 09/29/15 1930 09/29/15 2351 09/30/15 0402 09/30/15 0756  GLUCAP 126* 124* 184* 261* 267*   Iron Studies: No results for input(s): IRON, TIBC, TRANSFERRIN, FERRITIN in the last 72 hours. Lab Results  Component Value Date   INR 1.33 09/28/2015   INR 1.06 09/28/2015   INR 1.01 09/27/2015   Studies/Results: Dg Chest Port 1 View  Result Date: 09/30/2015 CLINICAL DATA:  Chest  tube in place. EXAM: PORTABLE CHEST 1 VIEW COMPARISON:  09/29/2015 FINDINGS: Pulmonary artery catheter has been removed. The right jugular central line is still present. Mediastinal drain has been removed. A left chest tube is still present. There are low lung volumes with vascular crowding and atelectasis. Negative for a pneumothorax. Stable appearance of the heart and mediastinum. Median sternotomy wires are noted. IMPRESSION: Low lung volumes. Left chest tube without pneumothorax. Electronically Signed   By: Markus Daft M.D.   On: 09/30/2015 07:36   Dg Chest Port 1 View  Result Date: 09/29/2015 CLINICAL DATA:  45 year old male status post CABG. Initial encounter. EXAM: PORTABLE CHEST 1 VIEW COMPARISON:  09/28/2015 and earlier. FINDINGS: Portable AP upright view at 0602 hours. Extubated. Enteric tube removed. Unchanged position of right IJ approach Swan-Ganz catheter. Stable left chest tube. Stable mediastinal tube. Continued low lung volumes but mildly improved since yesterday. No pneumothorax. Stable pulmonary vascularity without overt edema. No pleural effusion is evident. Stable cardiac size and mediastinal contours. IMPRESSION: 1. Extubated and enteric tube removed. Continued low lung volumes but slightly improved. 2.  Otherwise stable lines and tubes. 3. No pneumothorax, pulmonary edema, or areas of worsening ventilation. Electronically Signed   By: Genevie Ann M.D.   On: 09/29/2015 07:42   Dg Chest Port 1 View  Result Date: 09/28/2015 CLINICAL DATA:  Status post CABG.  Swan-Ganz catheter adjustment. EXAM: PORTABLE CHEST 1 VIEW COMPARISON:  09/28/2015 at 1418 hours FINDINGS: Endotracheal tube tip has been advanced to the level of the clavicular heads. Swan-Ganz catheter has been advanced and overlaps the left hilum. An orogastric tube tip reaches the stomach. Thoracic drains remain. Low volume chest with atelectasis. Stable postoperative heart size in this patient with CABG. No effusion or visible  pneumothorax. IMPRESSION: 1. Improved endotracheal tube positioning after advancement. The tip is at the clavicular heads. 2. Advanced Swan-Ganz catheter with tip over the left hilum. Looping at the right atrium is resolved. 3. Low volume chest with atelectasis. Electronically Signed   By: Monte Fantasia M.D.   On: 09/28/2015 15:43   Dg Chest Port 1 View  Result Date: 09/28/2015 CLINICAL DATA:  Postop day 0 CABG. EXAM: PORTABLE CHEST 1 VIEW COMPARISON:  09/27/2015, 09/20/2015 and earlier, including CTA chest 07/19/2015. FINDINGS: Endotracheal tube tip below the thoracic inlet approximately 7-8 cm above the carina. Swan-Ganz catheter looped in the right atrium with its tip projected at the expected location of right ventricle. Nasogastric tube courses below the diaphragm into the stomach. Left chest tube in place with no pneumothorax. Atelectasis in the upper lobes and lung bases bilaterally, left greater than right. IMPRESSION: 1. Swan-Ganz catheter looped in the right atrium with its tip at the expected position of the right ventricle. 2. Remaining support apparatus satisfactory. 3. No pneumothorax. 4. Atelectasis involving both lungs, left greater than right. I telephoned these results at the time of interpretation on 09/28/2015 at 2:26 pm to [facility], the nurse caring for the patient in the ICU, who verbally acknowledged these results. Electronically Signed   By: Evangeline Dakin M.D.   On: 09/28/2015 14:31   Medications: . sodium chloride Stopped (09/29/15 1100)  . sodium chloride 250 mL (09/30/15 1000)  . sodium chloride 20 mL/hr at 09/29/15 0150  . dexmedetomidine Stopped (09/28/15 1836)  . insulin (NOVOLIN-R) infusion Stopped (09/29/15 2000)  . lactated ringers    . lactated ringers    . nitroGLYCERIN Stopped (09/28/15 1445)  . phenylephrine (NEO-SYNEPHRINE) Adult infusion Stopped (09/29/15 0837)   . acetaminophen  1,000 mg Oral Q6H   Or  . acetaminophen (TYLENOL) oral liquid 160 mg/5 mL   1,000 mg Per Tube Q6H  . aspirin EC  325 mg Oral Daily   Or  . aspirin  324 mg Per Tube Daily  . atorvastatin  80 mg Oral q1800  . bisacodyl  10 mg Oral Daily   Or  . bisacodyl  10 mg Rectal Daily  . calcitRIOL  0.25 mcg Oral Q M,W,F-HD  . calcium acetate  2,001 mg Oral TID WC  . chlorhexidine gluconate (MEDLINE KIT)  15 mL Mouth Rinse BID  . darbepoetin (ARANESP) injection - DIALYSIS  100 mcg Intravenous Q Wed-HD  . docusate sodium  200 mg Oral Daily  . enoxaparin (LOVENOX) injection  30 mg Subcutaneous Q24H  . insulin aspart  0-24 Units Subcutaneous Q4H  . insulin detemir  20 Units Subcutaneous Daily  . insulin regular  0-10 Units Intravenous TID WC  . levothyroxine  50 mcg Oral QAC breakfast  . mouth rinse  15 mL Mouth Rinse QID  . metoprolol tartrate  12.5 mg Oral BID   Or  . metoprolol tartrate  12.5 mg Per Tube BID  . multivitamin  1 tablet Oral QHS  . mupirocin ointment  1 application Nasal BID  . pantoprazole  40 mg Oral Daily  . sodium chloride flush  3 mL Intravenous Q12H

## 2015-09-30 NOTE — Progress Notes (Signed)
Patient ID: Russell [name], male   DOB: Dec 13, 1970, 45 y.o.   MRN: [id number]   SICU Evening Rounds:   Hemodynamically stable    CBC    Component Value Date/Time   WBC 12.3 (H) 09/30/2015 0549   RBC 3.03 (L) 09/30/2015 0549   HGB 8.5 (L) 09/30/2015 0549   HCT 26.6 (L) 09/30/2015 0549   PLT 170 09/30/2015 0549   MCV 87.8 09/30/2015 0549   MCV 84.6 05/26/2014 1956   MCH 28.1 09/30/2015 0549   MCHC 32.0 09/30/2015 0549   RDW 16.3 (H) 09/30/2015 0549   LYMPHSABS 1.2 01/29/2015 1937   MONOABS 0.7 01/29/2015 1937   EOSABS 0.1 01/29/2015 1937   BASOSABS 0.1 01/29/2015 1937     BMET    Component Value Date/Time   NA 133 (L) 09/30/2015 0549   K 4.6 09/30/2015 0549   CL 95 (L) 09/30/2015 0549   CO2 25 09/30/2015 0549   GLUCOSE 280 (H) 09/30/2015 0549   BUN 32 (H) 09/30/2015 0549   CREATININE 5.55 (H) 09/30/2015 0549   CREATININE 1.56 (H) 05/26/2014 1946   CALCIUM 8.4 (L) 09/30/2015 0549   GFRNONAA 11 (L) 09/30/2015 0549   GFRNONAA 54 (L) 05/26/2014 1946   GFRAA 13 (L) 09/30/2015 0549   GFRAA 62 05/26/2014 1946     A/P:  Stable postop course. Continue current plans

## 2015-09-30 NOTE — Op Note (Signed)
Russell [name], Russell [name] NO.:  [id number]  MEDICAL RECORD NO.:  [id number]  LOCATION:                               FACILITY:  [facility]  PHYSICIAN:  Russell Plane, MD    DATE OF BIRTH:  August 17, 1970  DATE OF PROCEDURE:  09/28/2015 DATE OF DISCHARGE:                              OPERATIVE REPORT   PREOPERATIVE DIAGNOSIS:  Unstable angina with high-grade left anterior descending diagonal, circumflex coronary artery disease, and chronic hemodialysis.  POSTOPERATIVE DIAGNOSIS:  Unstable angina with high-grade left anterior descending diagonal, circumflex coronary artery disease, and chronic hemodialysis.  SURGICAL PROCEDURE:  Coronary artery bypass grafting x3 with the left internal mammary to the left anterior descending coronary artery, reverse saphenous vein graft to the diagonal coronary artery, reverse saphenous vein graft to the obtuse marginal coronary artery with left thigh endo vein harvesting of the greater saphenous vein.  SURGEON:  Russell Plane, MD.  FIRST ASSISTANT:  Rowe Clack, P.A.-C.  BRIEF HISTORY:  The patient is a 45 year old diabetic male, with end- stage renal disease, on dialysis, who presents with unstable anginal symptoms.  He underwent cardiac catheterization by Dr. Kirke Corin, which demonstrated 80% proximal LAD lesion, 80%-90% diagonal lesion, and 80% obtuse marginal.  The right coronary artery was free of disease.  The patient had echocardiogram with evidence of diastolic dysfunction. Risks and options of coronary artery bypass grafting especially in the setting of severe diabetes and dialysis were reviewed with the patient in detail, and recommendation was to proceed with coronary artery bypass grafting.  The patient agreed and signed informed consent.  DESCRIPTION OF PROCEDURE:  With Swan-Ganz and arterial line monitors in place, the patient underwent general endotracheal anesthesia without incident.  The skin of chest and legs was  prepped and draped in usual sterile manner.  Using the [employer] endo vein harvesting system, initially I attempted to harvest vein from the right thigh.  A small incision was made in the beginning and this vein was started to be harvested, but was small and also with varicosities.  Some incision was made at the left knee, and the left greater saphenous vein was harvested endoscopically and was of better quality.  Median sternotomy was performed.  Left internal mammary artery was dissected down as a pedicle graft.  The distal artery was divided, had good free flow.  Pericardium was opened.  Overall, ventricular function appeared preserved.  The patient was systemically heparinized.  Ascending aorta was cannulated. The right atrium was cannulated.  An aortic root vent cardioplegia needle was introduced into the ascending aorta.  The patient was placed on cardiopulmonary bypass 2.4 L/min/m2.  Sites of anastomosis were selected and dissected out of the epicardium.  The patient's body temperature was cooled to 20-32 degrees.  Aortic crossclamp was applied. A 500 mL of cold blood potassium cardioplegia was administered with diastolic arrest of the heart.  Myocardial septal temperatures were monitored throughout the crossclamp.  Attention was turned first to the circumflex into the obtuse marginal coronary artery, which was partially intramyocardial.  The vessel was opened, admitted to 1.5 mm probe distally.  Using a running 7-0 Prolene, distal anastomosis was performed in a similar fashion.  The diagonal coronary artery, which was 1.5 mm in size.  It was opened using a running 7-0 Prolene with segment of reverse saphenous vein graft was anastomosed.  Additional cold blood cardioplegia was administered down the vein grafts.  Attention was then turned to the left anterior descending coronary artery where in the mid portion, the vessel was opened and was of good quality and made it a 1.5 mm probe  distally.  Using a running 8-0 Prolene, left internal mammary artery was anastomosed to the ascending aorta.  With the cross-clamp still in place, 2 punch aortotomies were performed and each of the two vein grafts were anastomosed to the ascending aorta.  Bulldog on the mammary artery was removed with rise in myocardial septal temperature. The heart was allowed to passively fill and de-air through the proximal anastomosis.  The anastomosis were completed.  An aortic cross-clamp was removed with a total crossclamp time of 70 minutes.  The patient spontaneously converted to a sinus rhythm.  Sites of anastomosis were inspected and were free of bleeding.  On low-dose dopamine, the patient was then rewarmed to 37 degrees, was ventilated and weaned from cardiopulmonary bypass.  He remained hemodynamically stable.  TEE showed overall ventricular function.  He was decannulated in usual fashion. Protamine sulfate was administered.  Atrial and ventricular pacing wires were applied.  Graft markers were applied.  Left pleural tube and Blake mediastinal drain were left in place.  Pericardium was loosely reapproximated.  Sternum was closed with #6 stainless steel wire. Fascia was closed with interrupted 0 Vicryl, running 3-0 Vicryl subcutaneous tissue, and 4-0 subcuticular stitch in skin edges.  Dry dressings were applied.  Sponge and needle counts were reported as correct after completion of procedure.  The patient tolerated the procedure without obvious complication and was transferred to Surgical Intensive care Unit for further postoperative care.  Total pump time was 97 minutes.     Russell PlaneEdward Destane Speas, MD   ______________________________ Russell PlaneEdward Ashleyann Shoun, MD    EG/MEDQ  D:  09/29/2015  T:  09/30/2015  Job:  [id number]  cc:   Lorine BearsMuhammad Arida, MD

## 2015-09-30 NOTE — Progress Notes (Signed)
Pt encouraged to get OOB and walk, but pt refusing activity at this time, stating he needs "to rest". Will attempt ambulation later today.

## 2015-10-01 ENCOUNTER — Inpatient Hospital Stay (HOSPITAL_COMMUNITY): Payer: Medicare Other

## 2015-10-01 LAB — CBC
HCT: 23.9 % — ABNORMAL LOW (ref 39.0–52.0)
HEMATOCRIT: 24.6 % — AB (ref 39.0–52.0)
HEMOGLOBIN: 7.7 g/dL — AB (ref 13.0–17.0)
Hemoglobin: 7.4 g/dL — ABNORMAL LOW (ref 13.0–17.0)
MCH: 27.8 pg (ref 26.0–34.0)
MCH: 28 pg (ref 26.0–34.0)
MCHC: 31 g/dL (ref 30.0–36.0)
MCHC: 31.3 g/dL (ref 30.0–36.0)
MCV: 89.8 fL (ref 78.0–100.0)
MCV: 89.5 fL (ref 78.0–100.0)
Platelets: 176 10*3/uL (ref 150–400)
Platelets: 199 10*3/uL (ref 150–400)
RBC: 2.66 MIL/uL — ABNORMAL LOW (ref 4.22–5.81)
RBC: 2.75 MIL/uL — AB (ref 4.22–5.81)
RDW: 16.4 % — ABNORMAL HIGH (ref 11.5–15.5)
RDW: 16.3 % — ABNORMAL HIGH (ref 11.5–15.5)
WBC: 11.3 10*3/uL — ABNORMAL HIGH (ref 4.0–10.5)
WBC: 11.4 10*3/uL — ABNORMAL HIGH (ref 4.0–10.5)

## 2015-10-01 LAB — TYPE AND SCREEN
ABO/RH(D): B NEG
Antibody Screen: NEGATIVE
Unit division: 0
Unit division: 0
Unit division: 0
Unit division: 0

## 2015-10-01 LAB — GLUCOSE, CAPILLARY
GLUCOSE-CAPILLARY: 117 mg/dL — AB (ref 65–99)
GLUCOSE-CAPILLARY: 172 mg/dL — AB (ref 65–99)
GLUCOSE-CAPILLARY: 196 mg/dL — AB (ref 65–99)
Glucose-Capillary: 149 mg/dL — ABNORMAL HIGH (ref 65–99)
Glucose-Capillary: 260 mg/dL — ABNORMAL HIGH (ref 65–99)

## 2015-10-01 LAB — RENAL FUNCTION PANEL
ANION GAP: 13 (ref 5–15)
Albumin: 2.8 g/dL — ABNORMAL LOW (ref 3.5–5.0)
BUN: 37 mg/dL — ABNORMAL HIGH (ref 6–20)
CHLORIDE: 97 mmol/L — AB (ref 101–111)
CO2: 25 mmol/L (ref 22–32)
Calcium: 8.6 mg/dL — ABNORMAL LOW (ref 8.9–10.3)
Creatinine, Ser: 5.58 mg/dL — ABNORMAL HIGH (ref 0.61–1.24)
GFR calc Af Amer: 13 mL/min — ABNORMAL LOW (ref >60)
GFR calc non Af Amer: 11 mL/min — ABNORMAL LOW (ref >60)
GLUCOSE: 278 mg/dL — AB (ref 65–99)
PHOSPHORUS: 5.8 mg/dL — AB (ref 2.5–4.6)
POTASSIUM: 4 mmol/L (ref 3.5–5.1)
SODIUM: 135 mmol/L (ref 135–145)

## 2015-10-01 LAB — BASIC METABOLIC PANEL
Anion gap: 14 (ref 5–15)
BUN: 50 mg/dL — ABNORMAL HIGH (ref 6–20)
CO2: 25 mmol/L (ref 22–32)
Calcium: 8.4 mg/dL — ABNORMAL LOW (ref 8.9–10.3)
Chloride: 98 mmol/L — ABNORMAL LOW (ref 101–111)
Creatinine, Ser: 7.31 mg/dL — ABNORMAL HIGH (ref 0.61–1.24)
GFR calc Af Amer: 9 mL/min — ABNORMAL LOW (ref >60)
GFR calc non Af Amer: 8 mL/min — ABNORMAL LOW (ref >60)
Glucose, Bld: 164 mg/dL — ABNORMAL HIGH (ref 65–99)
Potassium: 4.1 mmol/L (ref 3.5–5.1)
Sodium: 137 mmol/L (ref 135–145)

## 2015-10-01 MED ORDER — INSULIN ASPART 100 UNIT/ML ~~LOC~~ SOLN
4.0000 [IU] | Freq: Three times a day (TID) | SUBCUTANEOUS | Status: DC
  Administered 2015-10-02 (×2): 4 [IU] via SUBCUTANEOUS

## 2015-10-01 MED ORDER — CALCITRIOL 0.25 MCG PO CAPS
ORAL_CAPSULE | ORAL | Status: AC
  Filled 2015-10-01: qty 1

## 2015-10-01 MED ORDER — INSULIN DETEMIR 100 UNIT/ML ~~LOC~~ SOLN
30.0000 [IU] | Freq: Every day | SUBCUTANEOUS | Status: DC
  Administered 2015-10-01 – 2015-10-02 (×2): 30 [IU] via SUBCUTANEOUS
  Filled 2015-10-01 (×2): qty 0.3

## 2015-10-01 NOTE — Progress Notes (Signed)
Dialysis Orders: [facility] MWF 4 hours 450/800 117 kg 2.0 K/2.5 Ca bath. UF profile 4 Heparin 8000 units IV at start of tx Heparin 3000 units IV mid run Mircera 75 mcg IV q 2 weeks (last dose 09/15/15 Has HGB 10.0 09/15/15) Venofer 50 mg IV weekly (last dose 09*131/17 Fe 75 Tsat 29 08/18/15) Calcitriol 0.25 mcg PO q treatment (last PTH 424 08/18/15)  Ca 8.7 C Ca 8.9 Phos 7.6 (08/18/15) Takes Phoslo 667 mg 3 capsules PO TID AC and 2 capsules with snacks.  Assessment/Plan: 1. 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  Off pressors 2.  Hypoxia: extubated, not real great air movement, suspect atalectasis.  Has IS and was encouraged to use.   2. ESRD-MWF - next HD 9/29, will increase UF as pt over EDW 3. Anemia- HGB 9.3 9/24 -Rec'd Aranesp 40 mcg IV 09/20- have increased to 100 9/27; transfuse prn after surgery 4. Secondary hyperparathyroidism- P 6.7/bindersVDRA. -follow labs 5.HTN/volume-  large fluid gains between sessions.  117 kg EDW. Not a lot of fluid on postop (bed wt with a little over 1 kg) 6. Nutrition- Albumin 2.8. PCM in the setting of obesity. Renal/Carb mod diet. Nepro/renal vit 7. DM: per primary. BS 200s 8. Hypothyroidism: per primary     MD [facility] pager [phone]   cell [phone] 10/01/2015, 10:40 AM   Subjective:   Still nauseated but reglan helping.    Objective Vitals:   10/01/15 0900 10/01/15 0930 10/01/15 1000 10/01/15 1030  BP: (!) 142/72 (!) 152/83 (!) 149/81 134/71  Pulse: 100 (!) 101 100 (!) 102  Resp: 15 16 15 15  Temp:      TempSrc:      SpO2: 100% 98% 97% 96%  Weight:      Height:       Physical Exam General: sitting in chair, sleeping but arousable.  Reports nausea but getting better   Heart: regular, no r/g.   Lungs: poor air movement bilaterally Abdomen: soft NT, becoming distended. Extremities: 1+ LE edema Dialysis Access:  Left AVF + bruit   Additional  Objective Labs: Basic Metabolic Panel:  Recent Labs Lab 09/26/15 0221 09/27/15 0433 09/27/15 1144  09/29/15 0435  09/29/15 1902 09/29/15 1916 09/30/15 0549 10/01/15 0357  NA 132* 129* 129*  < > 133*  < > 134*  --  133* 137  K 3.8 4.0 5.2*  < > 6.2*  < > 3.5  --  4.6 4.1  CL 94* 93* 94*  < > 96*  < > 97*  --  95* 98*  CO2 24 21* 20*  < > 21*  --   --   --  25 25  GLUCOSE 245* 258* 213*  < > 165*  < > 117*  --  280* 164*  BUN 50* 73* 77*  < > 48*  < > 19  --  32* 50*  CREATININE 7.79* 9.04* 9.06*  < > 7.66*  < > 3.50* 3.39* 5.55* 7.31*  CALCIUM 8.7* 8.7* 8.7*  < > 8.9  --   --   --  8.4* 8.4*  PHOS 6.7* 7.6* 6.9*  --   --   --   --   --   --   --   < > = values in this interval not displayed. Liver Function Tests:  Recent Labs Lab 09/27/15 0433 09/27/15 1144 09/30/15 0549  AST  --   --  25    ALT  --   --  22  ALKPHOS  --   --  57  BILITOT  --   --  0.7  PROT  --   --  6.2*  ALBUMIN 3.2* 3.4* 3.1*   No results for input(s): LIPASE, AMYLASE in the last 168 hours. CBC:  Recent Labs Lab 09/28/15 1940  09/29/15 0435  09/29/15 1916 09/30/15 0549 10/01/15 0357  WBC 14.3*  --  14.5*  --  13.7* 12.3* 11.3*  HGB 10.5*  < > 9.9*  < > 9.2* 8.5* 7.4*  HCT 32.4*  < > 30.9*  < > 28.2* 26.6* 23.9*  MCV 86.6  --  88.0  --  86.0 87.8 89.8  PLT 230  --  224  --  184 170 176  < > = values in this interval not displayed. Blood Culture No results found for: SDES, SPECREQUEST, CULT, REPTSTATUS  Cardiac Enzymes: No results for input(s): CKTOTAL, CKMB, CKMBINDEX, TROPONINI in the last 168 hours. CBG:  Recent Labs Lab 09/30/15 1255 09/30/15 1529 09/30/15 1937 09/30/15 2327 10/01/15 0340  GLUCAP 235* 186* 183* 141* 149*   Iron Studies: No results for input(s): IRON, TIBC, TRANSFERRIN, FERRITIN in the last 72 hours. Lab Results  Component Value Date   INR 1.33 09/28/2015   INR 1.06 09/28/2015   INR 1.01 09/27/2015   Studies/Results: Dg Chest Port 1 View  Result Date:  10/01/2015 CLINICAL DATA:  Chest tube.  Median sternotomy. EXAM: PORTABLE CHEST 1 VIEW COMPARISON:  One-view chest x-ray 09/30/2015. FINDINGS: The heart size is exaggerate by low lung volumes. Median sternotomy for CABG is noted. Mild diffuse edema is stable. A left-sided chest tube is in place without pneumothorax. Right IJ sheath is stable. The visualized soft tissues and bony thorax are otherwise unremarkable. IMPRESSION: 1. Stable left-sided chest tube without pneumothorax. 2. Stable low lung volumes and moderate edema following median sternotomy for CABG. 3. Stable right IJ sheath. Electronically Signed   By: San Morelle M.D.   On: 10/01/2015 07:30   Dg Chest Port 1 View  Result Date: 09/30/2015 CLINICAL DATA:  Chest tube in place. EXAM: PORTABLE CHEST 1 VIEW COMPARISON:  09/29/2015 FINDINGS: Pulmonary artery catheter has been removed. The right jugular central line is still present. Mediastinal drain has been removed. A left chest tube is still present. There are low lung volumes with vascular crowding and atelectasis. Negative for a pneumothorax. Stable appearance of the heart and mediastinum. Median sternotomy wires are noted. IMPRESSION: Low lung volumes. Left chest tube without pneumothorax. Electronically Signed   By: Markus Daft M.D.   On: 09/30/2015 07:36   Medications: . sodium chloride Stopped (09/29/15 1100)  . sodium chloride 250 mL (09/30/15 1000)  . sodium chloride 20 mL/hr at 09/29/15 0150  . lactated ringers    . lactated ringers     . acetaminophen  1,000 mg Oral Q6H   Or  . acetaminophen (TYLENOL) oral liquid 160 mg/5 mL  1,000 mg Per Tube Q6H  . aspirin EC  325 mg Oral Daily   Or  . aspirin  324 mg Per Tube Daily  . atorvastatin  80 mg Oral q1800  . bisacodyl  10 mg Oral Daily   Or  . bisacodyl  10 mg Rectal Daily  . calcitRIOL      . calcitRIOL  0.25 mcg Oral Q M,W,F-HD  . calcium acetate  2,001 mg Oral TID WC  . chlorhexidine gluconate (MEDLINE KIT)  15 mL  Mouth  Rinse BID  . darbepoetin (ARANESP) injection - DIALYSIS  100 mcg Intravenous Q Wed-HD  . docusate sodium  200 mg Oral Daily  . enoxaparin (LOVENOX) injection  30 mg Subcutaneous Q24H  . insulin aspart  0-24 Units Subcutaneous Q4H  . insulin aspart  4 Units Subcutaneous TID WC  . insulin detemir  30 Units Subcutaneous Daily  . levothyroxine  50 mcg Oral QAC breakfast  . mouth rinse  15 mL Mouth Rinse QID  . metoprolol tartrate  12.5 mg Oral BID   Or  . metoprolol tartrate  12.5 mg Per Tube BID  . multivitamin  1 tablet Oral QHS  . mupirocin ointment  1 application Nasal BID  . pantoprazole  40 mg Oral Daily  . sodium chloride flush  3 mL Intravenous Q12H              

## 2015-10-01 NOTE — Progress Notes (Signed)
PT Cancellation Note  Patient Details Name: Fonnie BirkenheadSteven B Yuille MRN: [id number] DOB: [birth year]   Cancelled Treatment:    Reason Eval/Treat Not Completed: Patient at procedure or test/unavailable (HD)   Toney Sangabor, Dashae Wilcher Beth 10/01/2015, 9:32 AM Delaney MeigsMaija Tabor Debraann Livingstone, PT [phone]

## 2015-10-01 NOTE — Procedures (Signed)
Patient seen on Hemodialysis. QB 450, UF goal 1.5L Treatment adjusted as needed.  May need new EDW.  Bufford ButtnerElizabeth Hetvi Shawhan MD [facility]. Cell [phone] Pager # [phone] 10:44 AM

## 2015-10-01 NOTE — Progress Notes (Signed)
Patient ID: Russell [name], male   DOB: Oct 06, 1970, 45 y.o.   MRN: [id number] TCTS DAILY ICU PROGRESS NOTE                   [address]            [place] [address]          [phone]   3 Days Post-Op Procedure(s) (LRB): CORONARY ARTERY BYPASS GRAFTING (CABG) x 3 UTILIZING LEFT MAMMARY ARTERY AND ENDOSCOPICALLY HARVESTED LEFT GREATER SAPHENOUS VEIN. (N/A) TRANSESOPHAGEAL ECHOCARDIOGRAM (TEE) (N/A) ENDOVEIN HARVEST OF GREATER SAPHENOUS VEIN (Left)  Total Length of Stay:  LOS: 11 days   Subjective: In dialysis most of day, nausea better  Objective: Vital signs in last 24 hours: Temp:  [97.8 F (36.6 C)-98.6 F (37 C)] 98.2 F (36.8 C) (09/29 1637) Pulse Rate:  [80-109] 109 (09/29 1530) Cardiac Rhythm: Normal sinus rhythm (09/29 0715) Resp:  [13-20] 18 (09/29 1120) BP: (101-185)/(61-162) 147/84 (09/29 1530) SpO2:  [90 %-100 %] 98 % (09/29 1120) Weight:  [257 lb 15 oz (117 kg)-263 lb 0.1 oz (119.3 kg)] 257 lb 15 oz (117 kg) (09/29 1120)  Filed Weights   10/01/15 0600 10/01/15 0705 10/01/15 1120  Weight: 263 lb 0.1 oz (119.3 kg) 261 lb 3.9 oz (118.5 kg) 257 lb 15 oz (117 kg)    Weight change: -16 lb 15.6 oz (-7.7 kg)   Hemodynamic parameters for last 24 hours:    Intake/Output from previous day: 09/28 0701 - 09/29 0700 In: 787.5 [P.O.:340; I.V.:447.5] Out: 505 [Urine:475; Chest Tube:30]  Intake/Output this shift: Total I/O In: -  Out: 1500 [Other:1500]  Current Meds: Scheduled Meds: . acetaminophen  1,000 mg Oral Q6H   Or  . acetaminophen (TYLENOL) oral liquid 160 mg/5 mL  1,000 mg Per Tube Q6H  . aspirin EC  325 mg Oral Daily   Or  . aspirin  324 mg Per Tube Daily  . atorvastatin  80 mg Oral q1800  . bisacodyl  10 mg Oral Daily   Or  . bisacodyl  10 mg Rectal Daily  . calcitRIOL  0.25 mcg Oral Q M,W,F-HD  . calcium acetate  2,001 mg Oral TID WC  . chlorhexidine gluconate (MEDLINE KIT)  15 mL Mouth Rinse BID  . darbepoetin (ARANESP)  injection - DIALYSIS  100 mcg Intravenous Q Wed-HD  . docusate sodium  200 mg Oral Daily  . enoxaparin (LOVENOX) injection  30 mg Subcutaneous Q24H  . insulin aspart  0-24 Units Subcutaneous Q4H  . insulin aspart  4 Units Subcutaneous TID WC  . insulin detemir  30 Units Subcutaneous Daily  . levothyroxine  50 mcg Oral QAC breakfast  . mouth rinse  15 mL Mouth Rinse QID  . metoprolol tartrate  12.5 mg Oral BID   Or  . metoprolol tartrate  12.5 mg Per Tube BID  . multivitamin  1 tablet Oral QHS  . mupirocin ointment  1 application Nasal BID  . pantoprazole  40 mg Oral Daily  . sodium chloride flush  3 mL Intravenous Q12H   Continuous Infusions: . sodium chloride Stopped (09/29/15 1100)  . sodium chloride 250 mL (09/30/15 1000)  . sodium chloride 20 mL/hr at 09/29/15 0150  . lactated ringers    . lactated ringers     PRN Meds:.sodium chloride, sodium chloride, sodium chloride, alteplase, heparin, heparin, lactated ringers, levalbuterol, lidocaine (PF), lidocaine-prilocaine, metoCLOPramide (REGLAN) injection, metoprolol, ondansetron (ZOFRAN) IV, oxyCODONE, pentafluoroprop-tetrafluoroeth, sodium chloride flush, traMADol  General appearance: alert and cooperative Neurologic: intact Heart: regular rate and rhythm, S1, S2 normal, no murmur, click, rub or gallop Lungs: diminished breath sounds bibasilar Abdomen: soft, non-tender; bowel sounds normal; no masses,  no organomegaly Extremities: extremities normal, atraumatic, no cyanosis or edema and Homans sign is negative, no sign of DVT Wound: sternum stable  Lab Results: CBC: Recent Labs  10/01/15 0357 10/01/15 1547  WBC 11.3* 11.4*  HGB 7.4* 7.7*  HCT 23.9* 24.6*  PLT 176 199   BMET:  Recent Labs  10/01/15 0357 10/01/15 1540  NA 137 135  K 4.1 4.0  CL 98* 97*  CO2 25 25  GLUCOSE 164* 278*  BUN 50* 37*  CREATININE 7.31* 5.58*  CALCIUM 8.4* 8.6*    CMET: Lab Results  Component Value Date   WBC 11.4 (H) 10/01/2015    HGB 7.7 (L) 10/01/2015   HCT 24.6 (L) 10/01/2015   PLT 199 10/01/2015   GLUCOSE 278 (H) 10/01/2015   CHOL 203 (H) 09/21/2015   TRIG 294 (H) 09/21/2015   HDL 33 (L) 09/21/2015   LDLCALC 111 (H) 09/21/2015   ALT 22 09/30/2015   AST 25 09/30/2015   NA 135 10/01/2015   K 4.0 10/01/2015   CL 97 (L) 10/01/2015   CREATININE 5.58 (H) 10/01/2015   BUN 37 (H) 10/01/2015   CO2 25 10/01/2015   TSH 1.243 07/20/2015   INR 1.33 09/28/2015   HGBA1C 12.5 (H) 09/28/2015   MICROALBUR 24.84 (H) 12/09/2012    PT/INR: No results for input(s): LABPROT, INR in the last 72 hours. Radiology: Dg Chest Port 1 View  Result Date: 10/01/2015 CLINICAL DATA:  Chest tube.  Median sternotomy. EXAM: PORTABLE CHEST 1 VIEW COMPARISON:  One-view chest x-ray 09/30/2015. FINDINGS: The heart size is exaggerate by low lung volumes. Median sternotomy for CABG is noted. Mild diffuse edema is stable. A left-sided chest tube is in place without pneumothorax. Right IJ sheath is stable. The visualized soft tissues and bony thorax are otherwise unremarkable. IMPRESSION: 1. Stable left-sided chest tube without pneumothorax. 2. Stable low lung volumes and moderate edema following median sternotomy for CABG. 3. Stable right IJ sheath. Electronically Signed   By: San Morelle M.D.   On: 10/01/2015 07:30     Assessment/Plan: S/P Procedure(s) (LRB): CORONARY ARTERY BYPASS GRAFTING (CABG) x 3 UTILIZING LEFT MAMMARY ARTERY AND ENDOSCOPICALLY HARVESTED LEFT GREATER SAPHENOUS VEIN. (N/A) TRANSESOPHAGEAL ECHOCARDIOGRAM (TEE) (N/A) ENDOVEIN HARVEST OF GREATER SAPHENOUS VEIN (Left)  D/c chest tube and central line  increase ambulation   [name] 10/01/2015 5:20 PM

## 2015-10-02 ENCOUNTER — Inpatient Hospital Stay (HOSPITAL_COMMUNITY): Payer: Medicare Other

## 2015-10-02 DIAGNOSIS — N186 End stage renal disease: Secondary | ICD-10-CM | POA: Diagnosis not present

## 2015-10-02 DIAGNOSIS — Z992 Dependence on renal dialysis: Secondary | ICD-10-CM | POA: Diagnosis not present

## 2015-10-02 DIAGNOSIS — N049 Nephrotic syndrome with unspecified morphologic changes: Secondary | ICD-10-CM | POA: Diagnosis not present

## 2015-10-02 LAB — BASIC METABOLIC PANEL
Anion gap: 12 (ref 5–15)
BUN: 55 mg/dL — ABNORMAL HIGH (ref 6–20)
CO2: 27 mmol/L (ref 22–32)
Calcium: 9.1 mg/dL (ref 8.9–10.3)
Chloride: 96 mmol/L — ABNORMAL LOW (ref 101–111)
Creatinine, Ser: 6.8 mg/dL — ABNORMAL HIGH (ref 0.61–1.24)
GFR calc Af Amer: 10 mL/min — ABNORMAL LOW (ref >60)
GFR calc non Af Amer: 9 mL/min — ABNORMAL LOW (ref >60)
Glucose, Bld: 156 mg/dL — ABNORMAL HIGH (ref 65–99)
Potassium: 3.9 mmol/L (ref 3.5–5.1)
Sodium: 135 mmol/L (ref 135–145)

## 2015-10-02 LAB — GLUCOSE, CAPILLARY
GLUCOSE-CAPILLARY: 122 mg/dL — AB (ref 65–99)
GLUCOSE-CAPILLARY: 140 mg/dL — AB (ref 65–99)
GLUCOSE-CAPILLARY: 209 mg/dL — AB (ref 65–99)
GLUCOSE-CAPILLARY: 83 mg/dL (ref 65–99)
Glucose-Capillary: 184 mg/dL — ABNORMAL HIGH (ref 65–99)
Glucose-Capillary: 62 mg/dL — ABNORMAL LOW (ref 65–99)
Glucose-Capillary: 64 mg/dL — ABNORMAL LOW (ref 65–99)

## 2015-10-02 LAB — CBC
HCT: 26.6 % — ABNORMAL LOW (ref 39.0–52.0)
Hemoglobin: 8.2 g/dL — ABNORMAL LOW (ref 13.0–17.0)
MCH: 27.7 pg (ref 26.0–34.0)
MCHC: 30.8 g/dL (ref 30.0–36.0)
MCV: 89.9 fL (ref 78.0–100.0)
Platelets: 243 10*3/uL (ref 150–400)
RBC: 2.96 MIL/uL — ABNORMAL LOW (ref 4.22–5.81)
RDW: 16.2 % — ABNORMAL HIGH (ref 11.5–15.5)
WBC: 11.3 10*3/uL — ABNORMAL HIGH (ref 4.0–10.5)

## 2015-10-02 MED ORDER — INSULIN DETEMIR 100 UNIT/ML ~~LOC~~ SOLN
25.0000 [IU] | Freq: Every day | SUBCUTANEOUS | Status: DC
  Administered 2015-10-03 – 2015-10-05 (×3): 25 [IU] via SUBCUTANEOUS
  Filled 2015-10-02 (×3): qty 0.25

## 2015-10-02 NOTE — Progress Notes (Signed)
Dialysis Orders: [facility] MWF 4 hours 450/800 117 kg 2.0 K/2.5 Ca bath. UF profile 4 Heparin 8000 units IV at start of tx Heparin 3000 units IV mid run Mircera 75 mcg IV q 2 weeks (last dose 09/15/15 Has HGB 10.0 09/15/15) Venofer 50 mg IV weekly (last dose 09*131/17 Fe 75 Tsat 29 08/18/15) Calcitriol 0.25 mcg PO q treatment (last PTH 424 08/18/15)  Ca 8.7 C Ca 8.9 Phos 7.6 (08/18/15) Takes Phoslo 667 mg 3 capsules PO TID AC and 2 capsules with snacks.  Assessment/Plan: 1. 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  No arrhythmias/ chest pain.  Pressors off, to stepdown today. 2.  Hypoxia: increase UFG Monday, improving.   2. ESRD-MWF - next HD 10/2, K 3.9 3. Anemia- HGB 9.3 9/24 -Rec'd Aranesp 40 mcg IV 09/20- have increased to 100 9/27; transfuse prn after surgery 4. Secondary hyperparathyroidism- P 6.7/bindersVDRA. Cca 10.1, use 2 Ca bath. 5.HTN/volume-  large fluid gains between sessions.  117 kg EDW. Not a lot of fluid on postop but bed wts.  UF as above. 6. Nutrition- Albumin 2.8. PCM in the setting of obesity. Renal/Carb mod diet. Nepro/renal vit 7. DM: per primary. BS 200s 8. Hypothyroidism: per primary   Madelon Lips MD [place] pager [phone]   cell [phone] 10/02/2015, 1:23 PM   Subjective:   Had a good morning- sat up, walked a little bit.  Mom at bedside, wants him to recover at their house in [place].  Objective Vitals:   10/02/15 0900 10/02/15 1000 10/02/15 1100 10/02/15 1201  BP: (!) 141/75 107/61 134/83   Pulse: 88 73 71   Resp: 16 14 15    Temp:    98.3 F (36.8 C)  TempSrc:    Oral  SpO2: 98% 97% 97%   Weight:      Height:       Physical Exam General: lying in bed, sleeping Neck: supple, thick, no over JVD Heart: regular, no r/g.   Lungs: improved air movement bilaterally Abdomen: soft NT, nd Extremities: 1+ LE edema Dialysis Access:  Left AVF + bruit   Additional  Objective Labs: Basic Metabolic Panel:  Recent Labs Lab 09/27/15 0433 09/27/15 1144  10/01/15 0357 10/01/15 1540 10/02/15 0635  NA 129* 129*  < > 137 135 135  K 4.0 5.2*  < > 4.1 4.0 3.9  CL 93* 94*  < > 98* 97* 96*  CO2 21* 20*  < > 25 25 27   GLUCOSE 258* 213*  < > 164* 278* 156*  BUN 73* 77*  < > 50* 37* 55*  CREATININE 9.04* 9.06*  < > 7.31* 5.58* 6.80*  CALCIUM 8.7* 8.7*  < > 8.4* 8.6* 9.1  PHOS 7.6* 6.9*  --   --  5.8*  --   < > = values in this interval not displayed. Liver Function Tests:  Recent Labs Lab 09/27/15 1144 09/30/15 0549 10/01/15 1540  AST  --  25  --   ALT  --  22  --   ALKPHOS  --  57  --   BILITOT  --  0.7  --   PROT  --  6.2*  --   ALBUMIN 3.4* 3.1* 2.8*   No results for input(s): LIPASE, AMYLASE in the last 168 hours. CBC:  Recent Labs Lab 09/29/15 1916 09/30/15 0549 10/01/15 0357 10/01/15 1547 10/02/15 0635  WBC 13.7* 12.3* 11.3* 11.4* 11.3*  HGB 9.2* 8.5* 7.4*  7.7* 8.2*  HCT 28.2* 26.6* 23.9* 24.6* 26.6*  MCV 86.0 87.8 89.8 89.5 89.9  PLT 184 170 176 199 243   Blood Culture No results found for: SDES, SPECREQUEST, CULT, REPTSTATUS  Cardiac Enzymes: No results for input(s): CKTOTAL, CKMB, CKMBINDEX, TROPONINI in the last 168 hours. CBG:  Recent Labs Lab 10/01/15 2021 10/01/15 2350 10/02/15 0309 10/02/15 0757 10/02/15 1159  GLUCAP 172* 117* 140* 184* 209*   Iron Studies: No results for input(s): IRON, TIBC, TRANSFERRIN, FERRITIN in the last 72 hours. Lab Results  Component Value Date   INR 1.33 09/28/2015   INR 1.06 09/28/2015   INR 1.01 09/27/2015   Studies/Results: Dg Chest Port 1 View  Result Date: 10/02/2015 CLINICAL DATA:  Postop status. EXAM: PORTABLE CHEST 1 VIEW COMPARISON:  Multiple chest x-rays since July 2017 FINDINGS: No pneumothorax. Stable cardiomediastinal silhouette. Decreasing pulmonary edema. Probable mild asymmetric edema remaining on the left. Rounded density in the right suprahilar region, not  seen in July of 2017, likely atelectasis. Recommend attention on follow-up. No other interval changes. IMPRESSION: 1. Improved pulmonary edema with mild asymmetric edema likely remaining on the left. 2. Rounded density in the right suprahilar region, likely atelectasis as it was not present in July of 2017. Recommend attention on follow-up and follow-up to resolution. Electronically Signed   By: Dorise Bullion III M.D   On: 10/02/2015 08:10   Dg Chest Port 1 View  Result Date: 10/01/2015 CLINICAL DATA:  Chest tube.  Median sternotomy. EXAM: PORTABLE CHEST 1 VIEW COMPARISON:  One-view chest x-ray 09/30/2015. FINDINGS: The heart size is exaggerate by low lung volumes. Median sternotomy for CABG is noted. Mild diffuse edema is stable. A left-sided chest tube is in place without pneumothorax. Right IJ sheath is stable. The visualized soft tissues and bony thorax are otherwise unremarkable. IMPRESSION: 1. Stable left-sided chest tube without pneumothorax. 2. Stable low lung volumes and moderate edema following median sternotomy for CABG. 3. Stable right IJ sheath. Electronically Signed   By: San Morelle M.D.   On: 10/01/2015 07:30   Medications: . sodium chloride Stopped (09/29/15 1100)  . sodium chloride 250 mL (09/30/15 1000)  . sodium chloride 20 mL/hr at 09/29/15 0150  . lactated ringers    . lactated ringers     . acetaminophen  1,000 mg Oral Q6H   Or  . acetaminophen (TYLENOL) oral liquid 160 mg/5 mL  1,000 mg Per Tube Q6H  . aspirin EC  325 mg Oral Daily   Or  . aspirin  324 mg Per Tube Daily  . atorvastatin  80 mg Oral q1800  . bisacodyl  10 mg Oral Daily   Or  . bisacodyl  10 mg Rectal Daily  . calcitRIOL  0.25 mcg Oral Q M,W,F-HD  . calcium acetate  2,001 mg Oral TID WC  . chlorhexidine gluconate (MEDLINE KIT)  15 mL Mouth Rinse BID  . darbepoetin (ARANESP) injection - DIALYSIS  100 mcg Intravenous Q Wed-HD  . docusate sodium  200 mg Oral Daily  . enoxaparin (LOVENOX)  injection  30 mg Subcutaneous Q24H  . insulin aspart  0-24 Units Subcutaneous Q4H  . insulin aspart  4 Units Subcutaneous TID WC  . insulin detemir  30 Units Subcutaneous Daily  . levothyroxine  50 mcg Oral QAC breakfast  . mouth rinse  15 mL Mouth Rinse QID  . metoprolol tartrate  12.5 mg Oral BID   Or  . metoprolol tartrate  12.5 mg Per Tube BID  .  multivitamin  1 tablet Oral QHS  . mupirocin ointment  1 application Nasal BID  . pantoprazole  40 mg Oral Daily  . sodium chloride flush  3 mL Intravenous Q12H

## 2015-10-02 NOTE — Progress Notes (Signed)
Patient is alert and orientedX4. Gave Orange juice and tolerated well. Will continue to monitor.

## 2015-10-02 NOTE — Plan of Care (Signed)
Problem: Activity: Goal: Risk for activity intolerance will decrease Outcome: Progressing Patient ambulated 300 ft with mild shortness of breath and desaturation to mid 80s. Return to baseline with rest.  Problem: Respiratory: Goal: Levels of oxygenation will improve Outcome: Progressing Weaned from 5 L to 2L of oxygen.

## 2015-10-02 NOTE — Progress Notes (Signed)
      [address]       [place] [id number]             [phone]      Ambulated this AM, but refused this PM  BP (!) 144/78 (BP Location: Right Arm)   Pulse 77   Temp 98.3 F (36.8 C) (Oral)   Resp 14   Ht 6\' 2"  (1.88 m)   Wt 256 lb 9.9 oz (116.4 kg)   SpO2 98%   BMI 32.95 kg/m    Intake/Output Summary (Last 24 hours) at 10/02/15 1845 Last data filed at 10/02/15 0800  Gross per 24 hour  Intake              720 ml  Output              300 ml  Net              420 ml   CBG low this PM- decrease levemir and dc meal coverage  Viviann SpareSteven C. Dorris FetchHendrickson, MD [facility] [phone]

## 2015-10-02 NOTE — Progress Notes (Signed)
4 Days Post-Op Procedure(s) (LRB): CORONARY ARTERY BYPASS GRAFTING (CABG) x 3 UTILIZING LEFT MAMMARY ARTERY AND ENDOSCOPICALLY HARVESTED LEFT GREATER SAPHENOUS VEIN. (N/A) TRANSESOPHAGEAL ECHOCARDIOGRAM (TEE) (N/A) ENDOVEIN HARVEST OF GREATER SAPHENOUS VEIN (Left) Subjective: Nausea improving but completely resolved  Objective: Vital signs in last 24 hours: Temp:  [97.9 F (36.6 C)-98.4 F (36.9 C)] 98.4 F (36.9 C) (09/30 0801) Pulse Rate:  [73-109] 78 (09/30 0700) Cardiac Rhythm: Normal sinus rhythm (09/30 0540) Resp:  [13-20] 15 (09/30 0700) BP: (101-162)/(64-89) 158/85 (09/30 0700) SpO2:  [90 %-100 %] 97 % (09/30 0700) Weight:  [256 lb 9.9 oz (116.4 kg)-257 lb 15 oz (117 kg)] 256 lb 9.9 oz (116.4 kg) (09/30 0310)  Hemodynamic parameters for last 24 hours:    Intake/Output from previous day: 09/29 0701 - 09/30 0700 In: 1030 [P.O.:950; I.V.:80] Out: 1500  Intake/Output this shift: No intake/output data recorded.  General appearance: alert, cooperative and no distress Neurologic: intact Heart: regular rate and rhythm Lungs: diminished breath sounds bibasilar Abdomen: normal findings: soft, non-tender and + BS  Lab Results:  Recent Labs  10/01/15 1547 10/02/15 0635  WBC 11.4* 11.3*  HGB 7.7* 8.2*  HCT 24.6* 26.6*  PLT 199 243   BMET:  Recent Labs  10/01/15 1540 10/02/15 0635  NA 135 135  K 4.0 3.9  CL 97* 96*  CO2 25 27  GLUCOSE 278* 156*  BUN 37* 55*  CREATININE 5.58* 6.80*  CALCIUM 8.6* 9.1    PT/INR: No results for input(s): LABPROT, INR in the last 72 hours. ABG    Component Value Date/Time   PHART 7.369 09/29/2015 0922   HCO3 23.8 09/29/2015 0922   TCO2 27 09/29/2015 1902   ACIDBASEDEF 2.0 09/29/2015 0922   O2SAT 90.0 09/29/2015 0922   CBG (last 3)   Recent Labs  10/01/15 2350 10/02/15 0309 10/02/15 0757  GLUCAP 117* 140* 184*    Assessment/Plan: S/P Procedure(s) (LRB): CORONARY ARTERY BYPASS GRAFTING (CABG) x 3 UTILIZING LEFT  MAMMARY ARTERY AND ENDOSCOPICALLY HARVESTED LEFT GREATER SAPHENOUS VEIN. (N/A) TRANSESOPHAGEAL ECHOCARDIOGRAM (TEE) (N/A) ENDOVEIN HARVEST OF GREATER SAPHENOUS VEIN (Left) -  CV- stable  RESP- bibasilar atelectasis- IS  RENAL- HD yesterday, plan per NEphrology  ENDO- CBG have been elevated continue SSI  GI- nausea improving, advance diet as tolerated  Continue ambulation   LOS: 12 days    Loreli SlotSteven C Carleah Yablonski 10/02/2015

## 2015-10-03 ENCOUNTER — Inpatient Hospital Stay (HOSPITAL_COMMUNITY): Payer: Medicare Other

## 2015-10-03 LAB — BASIC METABOLIC PANEL
Anion gap: 12 (ref 5–15)
BUN: 69 mg/dL — AB (ref 6–20)
CALCIUM: 8.2 mg/dL — AB (ref 8.9–10.3)
CHLORIDE: 96 mmol/L — AB (ref 101–111)
CO2: 26 mmol/L (ref 22–32)
CREATININE: 8.1 mg/dL — AB (ref 0.61–1.24)
GFR calc non Af Amer: 7 mL/min — ABNORMAL LOW (ref >60)
GFR, EST AFRICAN AMERICAN: 8 mL/min — AB (ref >60)
GLUCOSE: 112 mg/dL — AB (ref 65–99)
Potassium: 4 mmol/L (ref 3.5–5.1)
Sodium: 134 mmol/L — ABNORMAL LOW (ref 135–145)

## 2015-10-03 LAB — CBC
HCT: 23.7 % — ABNORMAL LOW (ref 39.0–52.0)
HCT: 26.1 % — ABNORMAL LOW (ref 39.0–52.0)
Hemoglobin: 7.5 g/dL — ABNORMAL LOW (ref 13.0–17.0)
Hemoglobin: 8 g/dL — ABNORMAL LOW (ref 13.0–17.0)
MCH: 28.3 pg (ref 26.0–34.0)
MCH: 27.6 pg (ref 26.0–34.0)
MCHC: 31.6 g/dL (ref 30.0–36.0)
MCHC: 30.7 g/dL (ref 30.0–36.0)
MCV: 89.4 fL (ref 78.0–100.0)
MCV: 90 fL (ref 78.0–100.0)
PLATELETS: 260 10*3/uL (ref 150–400)
PLATELETS: 290 10*3/uL (ref 150–400)
RBC: 2.65 MIL/uL — AB (ref 4.22–5.81)
RBC: 2.9 MIL/uL — AB (ref 4.22–5.81)
RDW: 16 % — AB (ref 11.5–15.5)
RDW: 15.8 % — ABNORMAL HIGH (ref 11.5–15.5)
WBC: 8.5 10*3/uL (ref 4.0–10.5)
WBC: 8.3 10*3/uL (ref 4.0–10.5)

## 2015-10-03 LAB — GLUCOSE, CAPILLARY
GLUCOSE-CAPILLARY: 116 mg/dL — AB (ref 65–99)
GLUCOSE-CAPILLARY: 180 mg/dL — AB (ref 65–99)
GLUCOSE-CAPILLARY: 205 mg/dL — AB (ref 65–99)
GLUCOSE-CAPILLARY: 120 mg/dL — AB (ref 65–99)

## 2015-10-03 LAB — IRON AND TIBC
Iron: 29 ug/dL — ABNORMAL LOW (ref 45–182)
SATURATION RATIOS: 14 % — AB (ref 17.9–39.5)
TIBC: 209 ug/dL — ABNORMAL LOW (ref 250–450)
UIBC: 180 ug/dL

## 2015-10-03 LAB — RENAL FUNCTION PANEL
Albumin: 2.5 g/dL — ABNORMAL LOW (ref 3.5–5.0)
Anion gap: 10 (ref 5–15)
BUN: 75 mg/dL — AB (ref 6–20)
CALCIUM: 7.9 mg/dL — AB (ref 8.9–10.3)
CHLORIDE: 94 mmol/L — AB (ref 101–111)
CO2: 27 mmol/L (ref 22–32)
CREATININE: 8.61 mg/dL — AB (ref 0.61–1.24)
GFR calc non Af Amer: 7 mL/min — ABNORMAL LOW (ref >60)
GFR, EST AFRICAN AMERICAN: 8 mL/min — AB (ref >60)
GLUCOSE: 195 mg/dL — AB (ref 65–99)
Phosphorus: 6.6 mg/dL — ABNORMAL HIGH (ref 2.5–4.6)
Potassium: 3.8 mmol/L (ref 3.5–5.1)
SODIUM: 131 mmol/L — AB (ref 135–145)

## 2015-10-03 LAB — FERRITIN: FERRITIN: 238 ng/mL (ref 24–336)

## 2015-10-03 MED ORDER — SODIUM CHLORIDE 0.9 % IV SOLN: 100.0000 mL | INTRAVENOUS | Status: DC | PRN

## 2015-10-03 MED ORDER — HEPARIN SODIUM (PORCINE) 1000 UNIT/ML DIALYSIS: 1000.0000 [IU] | INTRAMUSCULAR | Status: DC | PRN

## 2015-10-03 MED ORDER — LIDOCAINE HCL (PF) 1 % IJ SOLN: 5.0000 mL | INTRAMUSCULAR | Status: DC | PRN

## 2015-10-03 MED ORDER — SODIUM CHLORIDE 0.9 % IV SOLN
125.0000 mg | INTRAVENOUS | Status: DC
  Administered 2015-10-04: 125 mg via INTRAVENOUS
  Filled 2015-10-03 (×2): qty 10

## 2015-10-03 MED ORDER — INSULIN ASPART 100 UNIT/ML ~~LOC~~ SOLN: 0.0000 [IU] | Freq: Every day | SUBCUTANEOUS | Status: DC

## 2015-10-03 MED ORDER — ALTEPLASE 2 MG IJ SOLR: 2.0000 mg | Freq: Once | INTRAMUSCULAR | Status: DC | PRN

## 2015-10-03 MED ORDER — SODIUM CHLORIDE 0.9% FLUSH: 3.0000 mL | INTRAVENOUS | Status: DC | PRN

## 2015-10-03 MED ORDER — INSULIN ASPART 100 UNIT/ML ~~LOC~~ SOLN
3.0000 [IU] | Freq: Three times a day (TID) | SUBCUTANEOUS | Status: DC
  Administered 2015-10-03 – 2015-10-05 (×5): 3 [IU] via SUBCUTANEOUS

## 2015-10-03 MED ORDER — MOVING RIGHT ALONG BOOK
Freq: Once | Status: AC
  Administered 2015-10-03: 19:00:00
  Filled 2015-10-03: qty 1

## 2015-10-03 MED ORDER — SODIUM CHLORIDE 0.9 % IV SOLN: 250.0000 mL | INTRAVENOUS | Status: DC | PRN

## 2015-10-03 MED ORDER — GABAPENTIN 100 MG PO CAPS
200.0000 mg | ORAL_CAPSULE | Freq: Three times a day (TID) | ORAL | Status: DC
  Administered 2015-10-03 – 2015-10-05 (×5): 200 mg via ORAL
  Filled 2015-10-03 (×6): qty 2

## 2015-10-03 MED ORDER — SODIUM CHLORIDE 0.9% FLUSH
3.0000 mL | Freq: Two times a day (BID) | INTRAVENOUS | Status: DC
  Administered 2015-10-03 – 2015-10-05 (×4): 3 mL via INTRAVENOUS

## 2015-10-03 MED ORDER — CLONAZEPAM 1 MG PO TABS
1.0000 mg | ORAL_TABLET | Freq: Every day | ORAL | Status: DC
  Administered 2015-10-03 – 2015-10-04 (×2): 1 mg via ORAL
  Filled 2015-10-03 (×2): qty 1

## 2015-10-03 MED ORDER — LIDOCAINE-PRILOCAINE 2.5-2.5 % EX CREA: 1.0000 "application " | TOPICAL_CREAM | CUTANEOUS | Status: DC | PRN

## 2015-10-03 MED ORDER — PENTAFLUOROPROP-TETRAFLUOROETH EX AERO: 1.0000 "application " | INHALATION_SPRAY | CUTANEOUS | Status: DC | PRN

## 2015-10-03 MED ORDER — INSULIN ASPART 100 UNIT/ML ~~LOC~~ SOLN
0.0000 [IU] | Freq: Three times a day (TID) | SUBCUTANEOUS | Status: DC
  Administered 2015-10-03 – 2015-10-05 (×3): 3 [IU] via SUBCUTANEOUS
  Administered 2015-10-05: 1 [IU] via SUBCUTANEOUS

## 2015-10-03 NOTE — Progress Notes (Addendum)
Dialysis Orders: [facility] MWF 4 hours 450/800 117 kg 2.0 K/2.5 Ca bath. UF profile 4 Heparin 8000 units IV at start of tx Heparin 3000 units IV mid run Mircera 75 mcg IV q 2 weeks (last dose 09/15/15 Has HGB 10.0 09/15/15) Venofer 50 mg IV weekly (last dose 09*131/17 Fe 75 Tsat 29 08/18/15) Calcitriol 0.25 mcg PO q treatment (last PTH 424 08/18/15)  Ca 8.7 C Ca 8.9 Phos 7.6 (08/18/15) Takes Phoslo 667 mg 3 capsules PO TID AC and 2 capsules with snacks.  Assessment/Plan: 1. 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  No arrhythmias/ chest pain.  Encouraged sitting up in chair and participation in cardiac rehab. 2.  Nausea: improving, diet advanced.  Appears to be an acute on chronic issue as pt takes antiemetics at home.  Per primary. 3.  Hypoxia: increase UFG Monday, improving, off O2.  4. ESRD-MWF - next HD 10/2, K 3.9.  [place] [facility] and has inquired about temporarily switching centers while he recovers. 5. Anemia- HGB 9.3 9/24 -Rec'd Aranesp 40 mcg IV 09/20- have increased to 100 9/27; transfuse prn after surgery, Hgb 7.4 10/1, starting iron load on 10/2, have added on iron and TIBC 6. Secondary hyperparathyroidism- P 6.7/bindersVDRA. Cca 10.1, use 2 Ca bath. 7.HTN/volume-  large fluid gains between sessions.  117 kg EDW. Not a lot of fluid on postop but bed wts.  Uf, will likely challenge EDW 8. Nutrition- Albumin 2.8. PCM in the setting of obesity. Renal/Carb mod diet. Nepro/renal vit 9. DM: per primary. BS 200s 10. Hypothyroidism: per primary   Bufford ButtnerElizabeth Masato Pettie MD [facility] pager [phone]   cell [phone] 10/03/2015, 8:05 AM   Subjective:   Nausea improved, diet advanced.    Objective Vitals:   10/03/15 0300 10/03/15 0356 10/03/15 0400 10/03/15 0500  BP: 112/66  112/64 (!) 84/53  Pulse: 73  71 70  Resp: 16  (!) 9 14  Temp:  98 F (36.7 C)    TempSrc:  Oral    SpO2: 92%  95% 93%   Weight:    117.2 kg (258 lb 6.1 oz)  Height:       Physical Exam General: lying in bed, sleeping Neck: supple, thick, no over JVD Heart: regular, no r/g.   Lungs: improved air movement bilaterally Abdomen: soft NT, nd Extremities: 1+ LE edema Dialysis Access:  Left AVF + bruit   Additional Objective Labs: Basic Metabolic Panel:  Recent Labs Lab 09/27/15 0433 09/27/15 1144  10/01/15 1540 10/02/15 0635 10/03/15 0250  NA 129* 129*  < > 135 135 134*  K 4.0 5.2*  < > 4.0 3.9 4.0  CL 93* 94*  < > 97* 96* 96*  CO2 21* 20*  < > 25 27 26   GLUCOSE 258* 213*  < > 278* 156* 112*  BUN 73* 77*  < > 37* 55* 69*  CREATININE 9.04* 9.06*  < > 5.58* 6.80* 8.10*  CALCIUM 8.7* 8.7*  < > 8.6* 9.1 8.2*  PHOS 7.6* 6.9*  --  5.8*  --   --   < > = values in this interval not displayed. Liver Function Tests:  Recent Labs Lab 09/27/15 1144 09/30/15 0549 10/01/15 1540  AST  --  25  --   ALT  --  22  --   ALKPHOS  --  57  --   BILITOT  --  0.7  --  PROT  --  6.2*  --   ALBUMIN 3.4* 3.1* 2.8*   No results for input(s): LIPASE, AMYLASE in the last 168 hours. CBC:  Recent Labs Lab 09/30/15 0549 10/01/15 0357 10/01/15 1547 10/02/15 0635 10/03/15 0250  WBC 12.3* 11.3* 11.4* 11.3* 8.5  HGB 8.5* 7.4* 7.7* 8.2* 7.5*  HCT 26.6* 23.9* 24.6* 26.6* 23.7*  MCV 87.8 89.8 89.5 89.9 89.4  PLT 170 176 199 243 260   Blood Culture No results found for: SDES, SPECREQUEST, CULT, REPTSTATUS  Cardiac Enzymes: No results for input(s): CKTOTAL, CKMB, CKMBINDEX, TROPONINI in the last 168 hours. CBG:  Recent Labs Lab 10/02/15 1722 10/02/15 1723 10/02/15 1930 10/02/15 2352 10/03/15 0354  GLUCAP 64* 62* 122* 83 116*   Iron Studies: No results for input(s): IRON, TIBC, TRANSFERRIN, FERRITIN in the last 72 hours. Lab Results  Component Value Date   INR 1.33 09/28/2015   INR 1.06 09/28/2015   INR 1.01 09/27/2015   Studies/Results: Dg Chest Port 1 View  Result Date: 10/03/2015 CLINICAL  DATA:  Medical hx: asthma, diabetes, hypertension, renal insufficiency. Patient is not on oxygen, says he has no breathing problems or chest pain. EXAM: PORTABLE CHEST 1 VIEW COMPARISON:  10/02/2015 FINDINGS: Cardiac silhouette is mildly enlarged. Changes from CABG surgery are stable. No mediastinal widening. Mild suprahilar lung opacity is stable from the prior exam, likely atelectasis. Lung volumes are low. Lungs are otherwise clear. No convincing pleural effusion. No pneumothorax. IMPRESSION: 1. No acute cardiopulmonary disease. No significant residual pulmonary edema. Mild suprahilar lung opacity consistent with atelectasis. Electronically Signed   By: Amie Portland M.D.   On: 10/03/2015 07:35   Dg Chest Port 1 View  Result Date: 10/02/2015 CLINICAL DATA:  Postop status. EXAM: PORTABLE CHEST 1 VIEW COMPARISON:  Multiple chest x-rays since July 2017 FINDINGS: No pneumothorax. Stable cardiomediastinal silhouette. Decreasing pulmonary edema. Probable mild asymmetric edema remaining on the left. Rounded density in the right suprahilar region, not seen in July of 2017, likely atelectasis. Recommend attention on follow-up. No other interval changes. IMPRESSION: 1. Improved pulmonary edema with mild asymmetric edema likely remaining on the left. 2. Rounded density in the right suprahilar region, likely atelectasis as it was not present in July of 2017. Recommend attention on follow-up and follow-up to resolution. Electronically Signed   By: Gerome Sam III M.D   On: 10/02/2015 08:10   Medications: . sodium chloride Stopped (09/29/15 1100)  . sodium chloride 250 mL (09/30/15 1000)  . sodium chloride 20 mL/hr at 09/29/15 0150  . lactated ringers    . lactated ringers     . acetaminophen  1,000 mg Oral Q6H   Or  . acetaminophen (TYLENOL) oral liquid 160 mg/5 mL  1,000 mg Per Tube Q6H  . aspirin EC  325 mg Oral Daily   Or  . aspirin  324 mg Per Tube Daily  . atorvastatin  80 mg Oral q1800  .  bisacodyl  10 mg Oral Daily   Or  . bisacodyl  10 mg Rectal Daily  . calcitRIOL  0.25 mcg Oral Q M,W,F-HD  . calcium acetate  2,001 mg Oral TID WC  . darbepoetin (ARANESP) injection - DIALYSIS  100 mcg Intravenous Q Wed-HD  . docusate sodium  200 mg Oral Daily  . enoxaparin (LOVENOX) injection  30 mg Subcutaneous Q24H  . insulin aspart  0-24 Units Subcutaneous Q4H  . insulin detemir  25 Units Subcutaneous Daily  . levothyroxine  50 mcg Oral QAC breakfast  .  metoprolol tartrate  12.5 mg Oral BID   Or  . metoprolol tartrate  12.5 mg Per Tube BID  . multivitamin  1 tablet Oral QHS  . pantoprazole  40 mg Oral Daily  . sodium chloride flush  3 mL Intravenous Q12H

## 2015-10-03 NOTE — Progress Notes (Signed)
      [address]       [place] [id number]             [phone]      No new issues today  BP (!) 149/86 (BP Location: Right Arm)   Pulse 83   Temp 98.7 F (37.1 C) (Oral)   Resp (!) 21   Ht 6\' 2"  (1.88 m)   Wt 258 lb 6.1 oz (117.2 kg)   SpO2 95%   BMI 33.17 kg/m    Intake/Output Summary (Last 24 hours) at 10/03/15 2004 Last data filed at 10/03/15 1800  Gross per 24 hour  Intake              700 ml  Output              975 ml  Net             -275 ml    HD again tomorrow  Viviann SpareSteven C. Dorris FetchHendrickson, MD [facility] [phone]

## 2015-10-03 NOTE — Progress Notes (Signed)
5 Days Post-Op Procedure(s) (LRB): CORONARY ARTERY BYPASS GRAFTING (CABG) x 3 UTILIZING LEFT MAMMARY ARTERY AND ENDOSCOPICALLY HARVESTED LEFT GREATER SAPHENOUS VEIN. (N/A) TRANSESOPHAGEAL ECHOCARDIOGRAM (TEE) (N/A) ENDOVEIN HARVEST OF GREATER SAPHENOUS VEIN (Left) Subjective: Feels much better this AM No nausea at present Ambulated 600' this AM  Objective: Vital signs in last 24 hours: Temp:  [97.7 F (36.5 C)-98.3 F (36.8 C)] 97.9 F (36.6 C) (10/01 0814) Pulse Rate:  [69-85] 80 (10/01 0900) Cardiac Rhythm: Normal sinus rhythm (10/01 0800) Resp:  [9-18] 16 (10/01 0900) BP: (84-151)/(50-84) 84/53 (10/01 0500) SpO2:  [89 %-100 %] 100 % (10/01 0900) Weight:  [258 lb 6.1 oz (117.2 kg)] 258 lb 6.1 oz (117.2 kg) (10/01 0500)  Hemodynamic parameters for last 24 hours:    Intake/Output from previous day: 09/30 0701 - 10/01 0700 In: -  Out: 675 [Urine:675] Intake/Output this shift: Total I/O In: 350 [P.O.:350] Out: -   General appearance: alert, cooperative and no distress Neurologic: intact Heart: regular rate and rhythm Lungs: diminished breath sounds bibasilar L>R Abdomen: normal findings: soft, non-tender  Lab Results:  Recent Labs  10/02/15 0635 10/03/15 0250  WBC 11.3* 8.5  HGB 8.2* 7.5*  HCT 26.6* 23.7*  PLT 243 260   BMET:  Recent Labs  10/02/15 0635 10/03/15 0250  NA 135 134*  K 3.9 4.0  CL 96* 96*  CO2 27 26  GLUCOSE 156* 112*  BUN 55* 69*  CREATININE 6.80* 8.10*  CALCIUM 9.1 8.2*    PT/INR: No results for input(s): LABPROT, INR in the last 72 hours. ABG    Component Value Date/Time   PHART 7.369 09/29/2015 0922   HCO3 23.8 09/29/2015 0922   TCO2 27 09/29/2015 1902   ACIDBASEDEF 2.0 09/29/2015 0922   O2SAT 90.0 09/29/2015 0922   CBG (last 3)   Recent Labs  10/02/15 2352 10/03/15 0354 10/03/15 0812  GLUCAP 83 116* 180*    Assessment/Plan: S/P Procedure(s) (LRB): CORONARY ARTERY BYPASS GRAFTING (CABG) x 3 UTILIZING LEFT MAMMARY  ARTERY AND ENDOSCOPICALLY HARVESTED LEFT GREATER SAPHENOUS VEIN. (N/A) TRANSESOPHAGEAL ECHOCARDIOGRAM (TEE) (N/A) ENDOVEIN HARVEST OF GREATER SAPHENOUS VEIN (Left) -  Looks much better CV- in SR, stable  RESP- continue IS  RENAL- HD tomorrow  ENDO - CBG remain highly variable  GI- nausea improved  Continue cardiac rehab   LOS: 13 days    Loreli SlotSteven C Agnes Probert 10/03/2015

## 2015-10-04 LAB — CBC
HCT: 24.6 % — ABNORMAL LOW (ref 39.0–52.0)
Hemoglobin: 7.7 g/dL — ABNORMAL LOW (ref 13.0–17.0)
MCH: 27.8 pg (ref 26.0–34.0)
MCHC: 31.3 g/dL (ref 30.0–36.0)
MCV: 88.8 fL (ref 78.0–100.0)
PLATELETS: 305 10*3/uL (ref 150–400)
RBC: 2.77 MIL/uL — ABNORMAL LOW (ref 4.22–5.81)
RDW: 15.8 % — AB (ref 11.5–15.5)
WBC: 7.3 10*3/uL (ref 4.0–10.5)

## 2015-10-04 LAB — BASIC METABOLIC PANEL
ANION GAP: 13 (ref 5–15)
BUN: 89 mg/dL — AB (ref 6–20)
CALCIUM: 8 mg/dL — AB (ref 8.9–10.3)
CO2: 24 mmol/L (ref 22–32)
Chloride: 95 mmol/L — ABNORMAL LOW (ref 101–111)
Creatinine, Ser: 9.49 mg/dL — ABNORMAL HIGH (ref 0.61–1.24)
GFR calc Af Amer: 7 mL/min — ABNORMAL LOW (ref >60)
GFR, EST NON AFRICAN AMERICAN: 6 mL/min — AB (ref >60)
GLUCOSE: 115 mg/dL — AB (ref 65–99)
Potassium: 4 mmol/L (ref 3.5–5.1)
SODIUM: 132 mmol/L — AB (ref 135–145)

## 2015-10-04 LAB — GLUCOSE, CAPILLARY
GLUCOSE-CAPILLARY: 124 mg/dL — AB (ref 65–99)
Glucose-Capillary: 139 mg/dL — ABNORMAL HIGH (ref 65–99)
Glucose-Capillary: 201 mg/dL — ABNORMAL HIGH (ref 65–99)
Glucose-Capillary: 101 mg/dL — ABNORMAL HIGH (ref 65–99)
Glucose-Capillary: 191 mg/dL — ABNORMAL HIGH (ref 65–99)

## 2015-10-04 MED ORDER — CLOPIDOGREL BISULFATE 75 MG PO TABS
75.0000 mg | ORAL_TABLET | Freq: Every day | ORAL | Status: DC
  Administered 2015-10-04 – 2015-10-05 (×2): 75 mg via ORAL
  Filled 2015-10-04 (×2): qty 1

## 2015-10-04 MED ORDER — ACETAMINOPHEN 325 MG PO TABS
650.0000 mg | ORAL_TABLET | Freq: Four times a day (QID) | ORAL | Status: DC | PRN
  Administered 2015-10-04 (×2): 650 mg via ORAL
  Filled 2015-10-04 (×2): qty 2

## 2015-10-04 MED ORDER — METOPROLOL TARTRATE 12.5 MG HALF TABLET
12.5000 mg | ORAL_TABLET | Freq: Two times a day (BID) | ORAL | Status: DC
  Administered 2015-10-04 – 2015-10-05 (×3): 12.5 mg via ORAL
  Filled 2015-10-04 (×3): qty 1

## 2015-10-04 NOTE — Progress Notes (Signed)
    Subjective:  Feeling well. No chest pain or shortness of breath. Has been walking the halls without symptoms.  Objective:  Vital Signs in the last 24 hours: Temp:  [97.5 F (36.4 C)-99.5 F (37.5 C)] 97.5 F (36.4 C) (10/02 0809) Pulse Rate:  [77-91] 91 (10/02 0800) Resp:  [13-22] 18 (10/02 1000) BP: (113-150)/(60-86) 150/84 (10/02 0800) SpO2:  [87 %-100 %] 93 % (10/02 0405) Weight:  [259 lb 7.7 oz (117.7 kg)] 259 lb 7.7 oz (117.7 kg) (10/02 0500)  Intake/Output from previous day: 10/01 0701 - 10/02 0700 In: 700 [P.O.:700] Out: 600 [Urine:600]  Physical Exam: Pt is alert and oriented, pleasant obese male in NAD HEENT: normal Neck: JVP - normal Lungs: CTA bilaterally CV: RRR without murmur or gallop Abd: soft, NT, Positive BS, no hepatomegaly Ext: 1+ pretibial edema Skin: warm/dry no rash   Lab Results:  Recent Labs  10/03/15 1158 10/04/15 0504  WBC 8.3 7.3  HGB 8.0* 7.7*  PLT 290 305    Recent Labs  10/03/15 1158 10/04/15 0504  NA 131* 132*  K 3.8 4.0  CL 94* 95*  CO2 27 24  GLUCOSE 195* 115*  BUN 75* 89*  CREATININE 8.61* 9.49*   No results for input(s): TROPONINI in the last 72 hours.  Invalid input(s): CK, MB  Cardiac Studies: Echocardiogram 09/21/2015: Study Conclusions  - Left ventricle: The cavity size was normal. Septal wall thickness   was increased in a pattern of moderate LVH with severe   hypertrophy of the posterior wall. Systolic function was normal.   The estimated ejection fraction was in the range of 60% to 65%.   Wall motion was normal; there were no regional wall motion   abnormalities. Doppler parameters are consistent with abnormal   left ventricular relaxation (grade 1 diastolic dysfunction).   Doppler parameters are consistent with indeterminate ventricular   filling pressure. - Aortic valve: Transvalvular velocity was within the normal range.   There was no stenosis. There was no regurgitation. - Mitral valve:  Transvalvular velocity was within the normal range.   There was no evidence for stenosis. There was trivial   regurgitation. - Right ventricle: The cavity size was normal. Wall thickness was   normal. Systolic function was normal. - Tricuspid valve: There was no regurgitation. - Pulmonic valve: There was no regurgitation.  Tele: Normal sinus rhythm  Assessment/Plan:  1. Non-ST elevation myocardial infarction: Patient is now status post multivessel CABG. He is progressing well and will be transferred out of the surgical ICU pending bed availability today.  2. End-stage renal disease: Tolerating dialysis  3. Hyperlipidemia: On a high intensity statin drug  Overall the patient appears to be making good progress. Will add metoprolol 12.5 mg twice daily with hold parameters in place. Would probably benefit from Plavix 75 mg daily as he presented with non-ST elevation infarction. Will d/w Dr Tyrone SageGerhardt and TCTS team if plavix ok from post-op perspective.  Tonny Bollmanooper, Arryn Terrones, M.D. 10/04/2015, 11:12 AM

## 2015-10-04 NOTE — Progress Notes (Signed)
Physical Therapy Treatment Patient Details Name: Russell [name] MRN: [id number] DOB: December 17, 1970 Today's Date: 10/04/2015    History of Present Illness 45 yo admitted with NSTEMI s/p CABGx 3. PMHx: ESRD on MWF, DM, HTN, anemia, neuropathy, GERD, depression    PT Comments    Pt with excellent progression able to ambulate in hall without RW and perform stairs. Min cues still needed to maintain precautions and pt able to state all without difficulty. Educated for HEP and encouraged increased performance. Will continue to follow.   HR 91-112 BP 144/81 before 150/84 after activity   Follow Up Recommendations  Home health PT;Supervision/Assistance - 24 hour     Equipment Recommendations  None recommended by PT    Recommendations for Other Services       Precautions / Restrictions Precautions Precautions: Fall;Sternal    Mobility  Bed Mobility Overal bed mobility: Needs Assistance Bed Mobility: Rolling;Sidelying to Sit;Sit to Sidelying Rolling: Min guard Sidelying to sit: Min guard     Sit to sidelying: Min guard General bed mobility comments: cues for sequence and precautions  Transfers Overall transfer level: Needs assistance   Transfers: Sit to/from Stand Sit to Stand: Min guard         General transfer comment: cues for hand placment, anterior translation and safety, pt with initial posterior lean on standing  Ambulation/Gait Ambulation/Gait assistance: Min guard Ambulation Distance (Feet): 400 Feet Assistive device: Rolling walker (2 wheeled);None Gait Pattern/deviations: WFL(Within Functional Limits)   Gait velocity interpretation: at or above normal speed for age/gender General Gait Details: pt initiated gait with RW grossly 200' and then able to complete gait without RW without LOB or difficulty   Stairs Stairs: Yes Stairs assistance: Modified independent (Device/Increase time) Stair Management: Alternating pattern;Forwards;One rail Right Number of  Stairs: 3    Wheelchair Mobility    Modified Rankin (Stroke Patients Only)       Balance                                    Cognition Arousal/Alertness: Awake/alert Behavior During Therapy: WFL for tasks assessed/performed Overall Cognitive Status: Within Functional Limits for tasks assessed                      Exercises General Exercises - Lower Extremity Long Arc Quad: AROM;15 reps;Both;Seated Hip Flexion/Marching: AROM;Both;Seated;15 reps    General Comments        Pertinent Vitals/Pain Pain Score: 8  Pain Location: incisional Pain Descriptors / Indicators: Sore;Throbbing Pain Intervention(s): Limited activity within patient's tolerance;Monitored during session;Repositioned;Patient requesting pain meds-RN notified    Home Living                      Prior Function            PT Goals (current goals can now be found in the care plan section) Progress towards PT goals: Progressing toward goals;Goals met and updated - see care plan    Frequency           PT Plan Current plan remains appropriate;Equipment recommendations need to be updated    Co-evaluation             End of Session Equipment Utilized During Treatment: Gait belt Activity Tolerance: Patient tolerated treatment well Patient left: in bed;with call bell/phone within reach     Time: 6546-5035 PT Time Calculation (min) (ACUTE ONLY): 18  min  Charges:  $Gait Training: 8-22 mins                    G Codes:      Melford Aase 10/08/15, 9:19 AM Elwyn Reach, [place]

## 2015-10-04 NOTE — Progress Notes (Signed)
Patient ID: Russell [name], male   DOB: 07-Nov-1970, 45 y.o.   MRN: [id number] TCTS DAILY ICU PROGRESS NOTE                   [address]            [place] [id number]          [phone]   6 Days Post-Op Procedure(s) (LRB): CORONARY ARTERY BYPASS GRAFTING (CABG) x 3 UTILIZING LEFT MAMMARY ARTERY AND ENDOSCOPICALLY HARVESTED LEFT GREATER SAPHENOUS VEIN. (N/A) TRANSESOPHAGEAL ECHOCARDIOGRAM (TEE) (N/A) ENDOVEIN HARVEST OF GREATER SAPHENOUS VEIN (Left)  Total Length of Stay:  LOS: 14 days   Subjective: Feels much better, nausea gone  Objective: Vital signs in last 24 hours: Temp:  [97.7 F (36.5 C)-99.5 F (37.5 C)] 97.7 F (36.5 C) (10/02 0405) Pulse Rate:  [72-83] 83 (10/01 1700) Cardiac Rhythm: Normal sinus rhythm (10/02 0405) Resp:  [12-22] 15 (10/02 0405) BP: (113-150)/(60-86) 127/77 (10/02 0405) SpO2:  [87 %-100 %] 93 % (10/02 0405) Weight:  [259 lb 7.7 oz (117.7 kg)] 259 lb 7.7 oz (117.7 kg) (10/02 0500)  Filed Weights   10/02/15 0310 10/03/15 0500 10/04/15 0500  Weight: 256 lb 9.9 oz (116.4 kg) 258 lb 6.1 oz (117.2 kg) 259 lb 7.7 oz (117.7 kg)    Weight change: 1 lb 1.6 oz (0.5 kg)   Hemodynamic parameters for last 24 hours:    Intake/Output from previous day: 10/01 0701 - 10/02 0700 In: 700 [P.O.:700] Out: 600 [Urine:600]  Intake/Output this shift: No intake/output data recorded.  Current Meds: Scheduled Meds: . aspirin EC  325 mg Oral Daily   Or  . aspirin  324 mg Per Tube Daily  . atorvastatin  80 mg Oral q1800  . bisacodyl  10 mg Oral Daily   Or  . bisacodyl  10 mg Rectal Daily  . calcitRIOL  0.25 mcg Oral Q M,W,F-HD  . calcium acetate  2,001 mg Oral TID WC  . clonazePAM  1 mg Oral QHS  . darbepoetin (ARANESP) injection - DIALYSIS  100 mcg Intravenous Q Wed-HD  . enoxaparin (LOVENOX) injection  30 mg Subcutaneous Q24H  . ferric gluconate (FERRLECIT/NULECIT) IV  125 mg Intravenous Q M,W,F-HD  . gabapentin  200 mg Oral TID  .  insulin aspart  0-5 Units Subcutaneous QHS  . insulin aspart  0-9 Units Subcutaneous TID WC  . insulin aspart  3 Units Subcutaneous TID WC  . insulin detemir  25 Units Subcutaneous Daily  . levothyroxine  50 mcg Oral QAC breakfast  . multivitamin  1 tablet Oral QHS  . pantoprazole  40 mg Oral Daily  . sodium chloride flush  3 mL Intravenous Q12H   Continuous Infusions:  PRN Meds:.sodium chloride, alteplase, heparin, heparin, levalbuterol, lidocaine (PF), lidocaine-prilocaine, metoCLOPramide (REGLAN) injection, metoprolol, ondansetron (ZOFRAN) IV, oxyCODONE, pentafluoroprop-tetrafluoroeth, sodium chloride flush  General appearance: alert and cooperative Neurologic: intact Heart: regular rate and rhythm, S1, S2 normal, no murmur, click, rub or gallop Lungs: diminished breath sounds bibasilar Abdomen: soft, non-tender; bowel sounds normal; no masses,  no organomegaly Extremities: extremities normal, atraumatic, no cyanosis or edema Wound: sternum stable  Lab Results: CBC: Recent Labs  10/03/15 1158 10/04/15 0504  WBC 8.3 7.3  HGB 8.0* 7.7*  HCT 26.1* 24.6*  PLT 290 305   BMET:  Recent Labs  10/03/15 1158 10/04/15 0504  NA 131* 132*  K 3.8 4.0  CL 94* 95*  CO2 27 24  GLUCOSE 195* 115*  BUN 75* 89*  CREATININE 8.61* 9.49*  CALCIUM 7.9* 8.0*    CMET: Lab Results  Component Value Date   WBC 7.3 10/04/2015   HGB 7.7 (L) 10/04/2015   HCT 24.6 (L) 10/04/2015   PLT 305 10/04/2015   GLUCOSE 115 (H) 10/04/2015   CHOL 203 (H) 09/21/2015   TRIG 294 (H) 09/21/2015   HDL 33 (L) 09/21/2015   LDLCALC 111 (H) 09/21/2015   ALT 22 09/30/2015   AST 25 09/30/2015   NA 132 (L) 10/04/2015   K 4.0 10/04/2015   CL 95 (L) 10/04/2015   CREATININE 9.49 (H) 10/04/2015   BUN 89 (H) 10/04/2015   CO2 24 10/04/2015   TSH 1.243 07/20/2015   INR 1.33 09/28/2015   HGBA1C 12.5 (H) 09/28/2015   MICROALBUR 24.84 (H) 12/09/2012    PT/INR: No results for input(s): LABPROT, INR in the last  72 hours. Radiology: No results found.   Assessment/Plan: S/P Procedure(s) (LRB): CORONARY ARTERY BYPASS GRAFTING (CABG) x 3 UTILIZING LEFT MAMMARY ARTERY AND ENDOSCOPICALLY HARVESTED LEFT GREATER SAPHENOUS VEIN. (N/A) TRANSESOPHAGEAL ECHOCARDIOGRAM (TEE) (N/A) ENDOVEIN HARVEST OF GREATER SAPHENOUS VEIN (Left) Mobilize Diabetes control Plan for transfer to step-down: see transfer orders- placed yesterday  Dialysis today    Delight Ovens 10/04/2015 7:37 AM

## 2015-10-04 NOTE — Care Management Note (Signed)
Case Management Note  Patient Details  Name: Russell [name] MRN: [id number] Date of Birth: 1970-04-25  Subjective/Objective:  PT recommends home therapy and pt agrees, feels he needs to improve balance and endurance.  Provided list of agencies and referral made to [facility] per choice.  Pt lives with significant other who works, notes that his mother wants him to stay with her in [place] but [place] OP HD Center is in [place] - she possibly will come to stay with him for a few days.                                  Expected Discharge Plan:  Home w Home Health Services  Discharge planning Services  CM Consult  Post Acute Care Choice:  Home Health Choice offered to:  Patient  HH Arranged:  PT HH Agency:  [facility]  Status of Service:  In process, will continue to follow  Magdalene RiverMayo, Javyon Fontan T, RN 10/04/2015, 1:52 PM

## 2015-10-04 NOTE — Progress Notes (Signed)
Dialysis Orders:  [facility] MWF 4h  117kg  2/2.5 bath  P4  Hep 8000 +3000 mid Rx Mircera 75 mcg IV q 2 weeks (last dose 09/15/15 Has HGB 10.0 09/15/15) Venofer 50 mg IV weekly (last dose 09*131/17 Fe 75 Tsat 29 08/18/15) Calcitriol 0.25 mcg PO q treatment (last PTH 424 08/18/15)  Ca 8.7 C Ca 8.9 Phos 7.6 (08/18/15) Takes Phoslo 667 mg 3 capsules PO TID AC and 2 capsules with snacks.  Assessment: 1. 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   2. Nausea: resolved 3. HTN/ vol - is at dry wt today, decrease slightly at dc prob 4. ESRD-MWF - next HD 10/2, K 3.9.  [place] and has inquired about temporarily switching centers while he recovers. 5. Anemia- HGB 9.3 9/24 -Rec'd Aranesp 40 mcg IV 09/20- have increased to 100 9/27; transfuse prn after surgery, Hgb 7.4 10/1, starting iron load on 10/2, have added on iron and TIBC 6. Secondary hyperparathyroidism- P 6.7/bindersVDRA. Cca 10.1, use 2 Ca bath 7. Nutrition- Albumin 2.8. PCM in the setting of obesity. Renal/Carb mod diet. Nepro/renal vit 8. DM: per primary. BS 200s 9. Hypothyroidism: per primary   Plan - HD today upstairs, UF 1-2L  Vinson Moselleob Nyeli Holtmeyer MD [facility] pager [phone]   10/04/2015, 9:59 AM       Subjective:   Nausea improved, diet advanced.    Objective Vitals:   10/04/15 0500 10/04/15 0800 10/04/15 0809 10/04/15 0900  BP:  (!) 150/84    Pulse:  91    Resp:  13  16  Temp:   97.5 F (36.4 C)   TempSrc:   Oral   SpO2:      Weight: 117.7 kg (259 lb 7.7 oz)     Height:       Physical Exam General: lying in bed, sleeping Neck: supple, thick, no over JVD Heart: regular, no r/g.   Lungs: improved air movement bilaterally Abdomen: soft NT, nd Extremities: 1+ LE edema Dialysis Access:  Left AVF + bruit   Additional Objective Labs: Basic Metabolic Panel:  Recent Labs Lab 09/27/15 1144  10/01/15 1540  10/03/15 0250 10/03/15 1158  10/04/15 0504  NA 129*  < > 135  < > 134* 131* 132*  K 5.2*  < > 4.0  < > 4.0 3.8 4.0  CL 94*  < > 97*  < > 96* 94* 95*  CO2 20*  < > 25  < > 26 27 24   GLUCOSE 213*  < > 278*  < > 112* 195* 115*  BUN 77*  < > 37*  < > 69* 75* 89*  CREATININE 9.06*  < > 5.58*  < > 8.10* 8.61* 9.49*  CALCIUM 8.7*  < > 8.6*  < > 8.2* 7.9* 8.0*  PHOS 6.9*  --  5.8*  --   --  6.6*  --   < > = values in this interval not displayed. Liver Function Tests:  Recent Labs Lab 09/30/15 0549 10/01/15 1540 10/03/15 1158  AST 25  --   --   ALT 22  --   --   ALKPHOS 57  --   --   BILITOT 0.7  --   --   PROT 6.2*  --   --   ALBUMIN 3.1* 2.8* 2.5*   No results for input(s): LIPASE, AMYLASE in the last 168 hours. CBC:  Recent Labs Lab 10/01/15 1547  10/02/15 2130 10/03/15 0250 10/03/15 1158 10/04/15 0504  WBC 11.4* 11.3* 8.5 8.3 7.3  HGB 7.7* 8.2* 7.5* 8.0* 7.7*  HCT 24.6* 26.6* 23.7* 26.1* 24.6*  MCV 89.5 89.9 89.4 90.0 88.8  PLT 199 243 260 290 305   Blood Culture No results found for: SDES, SPECREQUEST, CULT, REPTSTATUS  Cardiac Enzymes: No results for input(s): CKTOTAL, CKMB, CKMBINDEX, TROPONINI in the last 168 hours. CBG:  Recent Labs Lab 10/03/15 0354 10/03/15 0812 10/03/15 1157 10/03/15 1605 10/04/15 0804  GLUCAP 116* 180* 205* 120* 124*   Iron Studies:   Recent Labs  10/03/15 1158  IRON 29*  TIBC 209*  FERRITIN 238   Lab Results  Component Value Date   INR 1.33 09/28/2015   INR 1.06 09/28/2015   INR 1.01 09/27/2015   Studies/Results: Dg Chest Port 1 View  Result Date: 10/03/2015 CLINICAL DATA:  Medical hx: asthma, diabetes, hypertension, renal insufficiency. Patient is not on oxygen, says he has no breathing problems or chest pain. EXAM: PORTABLE CHEST 1 VIEW COMPARISON:  10/02/2015 FINDINGS: Cardiac silhouette is mildly enlarged. Changes from CABG surgery are stable. No mediastinal widening. Mild suprahilar lung opacity is stable from the prior exam, likely  atelectasis. Lung volumes are low. Lungs are otherwise clear. No convincing pleural effusion. No pneumothorax. IMPRESSION: 1. No acute cardiopulmonary disease. No significant residual pulmonary edema. Mild suprahilar lung opacity consistent with atelectasis. Electronically Signed   By: Amie Portland M.D.   On: 10/03/2015 07:35   Medications:   . aspirin EC  325 mg Oral Daily   Or  . aspirin  324 mg Per Tube Daily  . atorvastatin  80 mg Oral q1800  . bisacodyl  10 mg Oral Daily   Or  . bisacodyl  10 mg Rectal Daily  . calcitRIOL  0.25 mcg Oral Q M,W,F-HD  . calcium acetate  2,001 mg Oral TID WC  . clonazePAM  1 mg Oral QHS  . darbepoetin (ARANESP) injection - DIALYSIS  100 mcg Intravenous Q Wed-HD  . enoxaparin (LOVENOX) injection  30 mg Subcutaneous Q24H  . ferric gluconate (FERRLECIT/NULECIT) IV  125 mg Intravenous Q M,W,F-HD  . gabapentin  200 mg Oral TID  . insulin aspart  0-5 Units Subcutaneous QHS  . insulin aspart  0-9 Units Subcutaneous TID WC  . insulin aspart  3 Units Subcutaneous TID WC  . insulin detemir  25 Units Subcutaneous Daily  . levothyroxine  50 mcg Oral QAC breakfast  . multivitamin  1 tablet Oral QHS  . pantoprazole  40 mg Oral Daily  . sodium chloride flush  3 mL Intravenous Q12H

## 2015-10-04 NOTE — Progress Notes (Signed)
Hypoglycemic Event  CBG: 64/62  Treatment: Gave Orange Juice X2 cups  Symptoms: None  Follow-up CBG: Time:1930 CBG Result:122  Possible Reasons for Event: Insulin doses had been changed multiple times this admission and dinner trays late.  Comments/MD notified:yes    Dorma RussellKristy G Betrice [name]

## 2015-10-04 NOTE — Procedures (Signed)
Patient was seen on dialysis and the procedure was supervised.  BFR 400  Via AVF BP is  106/60.   Patient appears to be tolerating treatment well  Russell [name] A 10/04/2015

## 2015-10-04 NOTE — Care Management Important Message (Signed)
Important Message  Patient Details  Name: Russell [name] MRN: [id number] Date of Birth: [birth year]   Medicare Important Message Given:  Yes    Kyla BalzarineShealy, Daijha Leggio Abena 10/04/2015, 12:18 PM

## 2015-10-04 NOTE — Progress Notes (Addendum)
Inpatient Diabetes Program Recommendations  AACE/ADA: New Consensus Statement on Inpatient Glycemic Control (2015)  Target Ranges:  Prepandial:   less than 140 mg/dL      Peak postprandial:   less than 180 mg/dL (1-2 hours)      Critically ill patients:  140 - 180 mg/dL   Lab Results  Component Value Date   GLUCAP 201 (H) 10/04/2015   HGBA1C 12.5 (H) 09/28/2015    Review of Glycemic Control Results for Russell [name], Russell [name] (MRN [id number]) as of 10/04/2015 12:48  Ref. Range 10/03/2015 08:12 10/03/2015 11:57 10/03/2015 16:05 10/03/2015 21:36 10/04/2015 08:04 10/04/2015 11:45  Glucose-Capillary Latest Ref Range: 65 - 99 mg/dL 409180 (H) 811205 (H) 914120 (H) 139 (H) 124 (H) 201 (H)   Diabetes history: Type2 diabetes Outpatient Diabetes medications: Lantus 60 units bid, Humalog 15-30 units q evening Current orders for Inpatient glycemic control:  TCTS q 4 hours, Levemir 20 units daily  Inpatient Diabetes Program Recommendations:  Received consult regarding home regimen.  -Current dose of Levemir 25 units daily + meal coverage 3-5 units and followup with PCP post discharge.  Thank you, Billy FischerJudy E. Karin Pinedo, RN, MSN, CDE Inpatient Glycemic Control Team Team Pager [phone] (8am-5pm) 10/04/2015 12:53 PM

## 2015-10-04 NOTE — Progress Notes (Addendum)
Patient ID: Russell BirkenheadSteven B Lockridge, male   DOB: [birth year], 45 y.o.   MRN: [id number] EVENING ROUNDS NOTE :     [address]       [place] [id number]             [phone]                 6 Days Post-Op Procedure(s) (LRB): CORONARY ARTERY BYPASS GRAFTING (CABG) x 3 UTILIZING LEFT MAMMARY ARTERY AND ENDOSCOPICALLY HARVESTED LEFT GREATER SAPHENOUS VEIN. (N/A) TRANSESOPHAGEAL ECHOCARDIOGRAM (TEE) (N/A) ENDOVEIN HARVEST OF GREATER SAPHENOUS VEIN (Left)  Total Length of Stay:  LOS: 14 days  BP 124/74 (BP Location: Right Arm)   Pulse 94   Temp 98.7 F (37.1 C) (Oral)   Resp 19   Ht 6\' 2"  (1.88 m)   Wt 263 lb 14.3 oz (119.7 kg)   SpO2 98%   BMI 33.88 kg/m   .Intake/Output      10/02 0701 - 10/03 0700   P.O.    Total Intake(mL/kg)    Urine (mL/kg/hr) 600 (0.4)   Other 2000 (1.3)   Total Output 2600   Net -2600             Lab Results  Component Value Date   WBC 7.3 10/04/2015   HGB 7.7 (L) 10/04/2015   HCT 24.6 (L) 10/04/2015   PLT 305 10/04/2015   GLUCOSE 115 (H) 10/04/2015   CHOL 203 (H) 09/21/2015   TRIG 294 (H) 09/21/2015   HDL 33 (L) 09/21/2015   LDLCALC 111 (H) 09/21/2015   ALT 22 09/30/2015   AST 25 09/30/2015   NA 132 (L) 10/04/2015   K 4.0 10/04/2015   CL 95 (L) 10/04/2015   CREATININE 9.49 (H) 10/04/2015   BUN 89 (H) 10/04/2015   CO2 24 10/04/2015   TSH 1.243 07/20/2015   INR 1.33 09/28/2015   HGBA1C 12.5 (H) 09/28/2015   MICROALBUR 24.84 (H) 12/09/2012   Waiting for 2w bed Stable day Feels better Poss home in am  Delight OvensEdward B Julyanna Scholle MD  Beeper [phone] Office [phone] 10/04/2015 8:14 PM

## 2015-10-05 ENCOUNTER — Inpatient Hospital Stay (HOSPITAL_COMMUNITY): Payer: Medicare Other

## 2015-10-05 LAB — CBC
HCT: 25.5 % — ABNORMAL LOW (ref 39.0–52.0)
Hemoglobin: 7.9 g/dL — ABNORMAL LOW (ref 13.0–17.0)
MCH: 27.6 pg (ref 26.0–34.0)
MCHC: 31 g/dL (ref 30.0–36.0)
MCV: 89.2 fL (ref 78.0–100.0)
Platelets: 319 10*3/uL (ref 150–400)
RBC: 2.86 MIL/uL — ABNORMAL LOW (ref 4.22–5.81)
RDW: 15.7 % — ABNORMAL HIGH (ref 11.5–15.5)
WBC: 6.8 10*3/uL (ref 4.0–10.5)

## 2015-10-05 LAB — BASIC METABOLIC PANEL
Anion gap: 8 (ref 5–15)
BUN: 44 mg/dL — ABNORMAL HIGH (ref 6–20)
CO2: 30 mmol/L (ref 22–32)
Calcium: 7.6 mg/dL — ABNORMAL LOW (ref 8.9–10.3)
Chloride: 97 mmol/L — ABNORMAL LOW (ref 101–111)
Creatinine, Ser: 6 mg/dL — ABNORMAL HIGH (ref 0.61–1.24)
GFR calc Af Amer: 12 mL/min — ABNORMAL LOW (ref >60)
GFR calc non Af Amer: 10 mL/min — ABNORMAL LOW (ref >60)
Glucose, Bld: 139 mg/dL — ABNORMAL HIGH (ref 65–99)
Potassium: 3.9 mmol/L (ref 3.5–5.1)
Sodium: 135 mmol/L (ref 135–145)

## 2015-10-05 LAB — GLUCOSE, CAPILLARY
Glucose-Capillary: 139 mg/dL — ABNORMAL HIGH (ref 65–99)
Glucose-Capillary: 203 mg/dL — ABNORMAL HIGH (ref 65–99)

## 2015-10-05 MED ORDER — ASPIRIN 81 MG PO TBEC: 81.0000 mg | DELAYED_RELEASE_TABLET | Freq: Every day | ORAL | Status: AC

## 2015-10-05 MED ORDER — METOPROLOL TARTRATE 25 MG PO TABS: 12.5000 mg | ORAL_TABLET | Freq: Two times a day (BID) | ORAL | 3 refills | Status: DC

## 2015-10-05 MED ORDER — CALCITRIOL 0.25 MCG PO CAPS: 0.2500 ug | ORAL_CAPSULE | ORAL | 3 refills | Status: DC

## 2015-10-05 MED ORDER — ASPIRIN 81 MG PO CHEW: 81.0000 mg | CHEWABLE_TABLET | Freq: Every day | ORAL | Status: DC

## 2015-10-05 MED ORDER — CLOPIDOGREL BISULFATE 75 MG PO TABS: 75.0000 mg | ORAL_TABLET | Freq: Every day | ORAL | 3 refills | Status: DC

## 2015-10-05 MED ORDER — ASPIRIN EC 81 MG PO TBEC
81.0000 mg | DELAYED_RELEASE_TABLET | Freq: Every day | ORAL | Status: DC
  Administered 2015-10-05: 81 mg via ORAL
  Filled 2015-10-05: qty 1

## 2015-10-05 MED ORDER — ATORVASTATIN CALCIUM 80 MG PO TABS: 80.0000 mg | ORAL_TABLET | Freq: Every day | ORAL | 3 refills | Status: DC

## 2015-10-05 NOTE — Progress Notes (Signed)
Pacing wires d/ced per MD order.

## 2015-10-05 NOTE — Care Management Note (Signed)
Case Management Note  Patient Details  Name: Russell [name] MRN: [id number] Date of Birth: [birth year]  Subjective/Objective:    Pt to d/c home with significant other, states mother plans to assist as needed.  Will discharge with home health RN and PT through [facility] per his choice.                           Expected Discharge Plan:  Home w Home Health Services  Discharge planning Services  CM Consult  Post Acute Care Choice:  Home Health Choice offered to:  Patient  HH Arranged[facility]  [facility]  Status of Service:  Completed, signed off  Magdalene RiverMayo, Shoshanna Mcquitty T, [place]RN 10/05/2015, 1:21 PM

## 2015-10-05 NOTE — Progress Notes (Signed)
    Subjective:  Feels well. Eager to go home.   Objective:  Vital Signs in the last 24 hours: Temp:  [97.5 F (36.4 C)-98.7 F (37.1 C)] 97.6 F (36.4 C) (10/03 0700) Pulse Rate:  [81-95] 81 (10/03 0437) Resp:  [15-22] 19 (10/03 0900) BP: (106-142)/(60-80) 127/71 (10/03 0900) SpO2:  [93 %-98 %] 95 % (10/03 0900) Weight:  [253 lb 12 oz (115.1 kg)-267 lb 6.7 oz (121.3 kg)] 253 lb 12 oz (115.1 kg) (10/03 0530)  Intake/Output from previous day: 10/02 0701 - 10/03 0700 In: 110 [IV Piggyback:110] Out: 2600 [Urine:600]  Physical Exam: Pt is alert and oriented, NAD HEENT: normal Neck: JVP - normal Lungs: CTA bilaterally CV: RRR without murmur or gallop Abd: soft, NT, obese Ext: no C/C/E Skin: warm/dry no rash   Lab Results:  Recent Labs  10/04/15 0504 10/05/15 0339  WBC 7.3 6.8  HGB 7.7* 7.9*  PLT 305 319    Recent Labs  10/04/15 0504 10/05/15 0339  NA 132* 135  K 4.0 3.9  CL 95* 97*  CO2 24 30  GLUCOSE 115* 139*  BUN 89* 44*  CREATININE 9.49* 6.00*   No results for input(s): TROPONINI in the last 72 hours.  Invalid input(s): CK, MB  Tele: Sinus rhythm  Assessment/Plan:  1. NSTEMI: severe MV CAD s/p CABG. Has progressed very well and is ready for discharge. 2. ESRD: tolerating dialysis, hemodynamically stable. 3. Hyperlipidemia: continue statin drug.  Pt's medications reviewed. He is on ASA, plavix, atorvastatin 80 mg, and low-dose metoprolol. Considering ESRD on dialysis, would hold ACE until seen in FU to make sure BP is tolerating dialysis sessions. BP currently in low-normal range. Will arrange cardiology FU with Dr Jens Somrenshaw.  Tonny Bollmanooper, Darilyn Storbeck, M.D. 10/05/2015, 11:04 AM

## 2015-10-05 NOTE — Op Note (Signed)
NAMETALLEN, SCHNORR NO.:  [id number]  MEDICAL RECORD NO.:  [id number]  LOCATION:                               FACILITY:  [facility]  PHYSICIAN:  Burna Forts, M.D.DATE OF BIRTH:  31-Oct-1970  DATE OF PROCEDURE:  09/28/2015 DATE OF DISCHARGE:                              OPERATIVE REPORT   INDICATION FOR PROCEDURE:  Mr. Rubens is a 45 year old patient of Dr. Denny Levy, who presents with acute coronary syndrome.  He is also known to be on hemodialysis.  He was found to have severe coronary artery disease and continuing chest pain and was elected to bring the surgery for coronary artery bypass grafting.  This is to be performed by Dr. Sheliah Plane.  On the morning of surgery, the patient was brought to the holding area where under local anesthesia with sedation pulmonary artery and radial arterial lines are placed.  The patient is taken to the OR for routine induction of general anesthesia, after which the TEE probe was prepared and passed oropharyngeally into the stomach then slightly withdrawn for imaging of the cardiac structures.  PRECARDIOPULMONARY BYPASS EXAMINATION:  Left ventricle:  The left ventricular chamber is seen initially in the short axis view.  There appeared to be normal chamber wall size and thickness.  Overall, there is very mild depression of contractility noted.  All segmental wall areas, however, were contractile and thickening appropriately.  Both anterior, anterolateral, inferior, and posterior walls were analyzed as well as septal walls all were thickening at this time.  Long-axis views were obtained, which again shows a posterolateral and septal wall areas were contractile.  All way to the apex.  Mitral valve:  The mitral valve is seen in the four-chamber view.  It is thin, compliant, mobile.  On color Doppler, we noted trivial mitral regurgitant flow.  Coaptation point was appropriate.  On pulse wave measurements, this pressures  were consistent with significant diastolic dysfunction.  Left atrium:  The left atrial chamber is seen.  There is normal chamber size.  The interatrial septum is interrogated and is intact.  No other masses are appreciated.  Aortic valve:  The aortic valve is seen initially in the short axis view.  It is trileaflet.  Leaflets are thin, mobile, compliant.  There is satisfactory opening during systolic contraction.  A satisfactory closing during diastole.  On color Doppler across this valve,there was essentially no regurgitant or stenotic flows noted.  There is mild annular calcium appreciated.  Right ventricle:  Right ventricular chamber is seen initially in the four-chamber view.  There is normal free wall motion.  Good overall contractile pattern noted.  Slightly mild enlargement of the right ventricular total area itself.  Tricuspid valve:  The tricuspid valve is seen in the four-chamber view. As a mildly dilated anulus appreciated.  This was measured at 3.8 cm. On color Doppler, we could see a fairly moderate jet of tricuspid regurgitant flow.  This was analyzed in several views.  I instilled one- third to greater of the right atrial chamber itself.  The vena contracta area was about 0.4-0.5.  The overall size of the tricuspid regurgitant jet appeared to be a moderate  significance.  Right atrium:  Essentially normal right atrial chamber in its appearance, mildly dilated chamber overall.  The septum was interrogated as per usual in this view.  Pulmonary artery catheter is noted within.  The patient placed on cardiopulmonary bypass.  Coronary artery bypass grafting is carried out.  The patient was then rewarmed, separated from cardiopulmonary bypass with the initial attempt.  On early Doppler, we could see a line of microbubbles in the left ventricular distal area of the apex.  This was later associated with some movement of these microbubbles through the aortic valve into one of  the grafts, which was associated with segmental changes in the EKG consistent with some element of ischemia.  This was also consistent on the echo probe itself as we could see some significant dysfunction in the anteroseptal areas of the heart itself.  With time and separation from cardiopulmonary bypass, this area indeed improve.  Overall 15-20 minutes after separation of cardiopulmonary bypass, there was good overall contractile pattern noted in the left ventricular chamber in both short and long- axis views.  Perhaps there was some mild hypokinesis noted in the anteroseptal distal apical area of the left ventricular chamber.  The rest of the cardiac examination was as previously described.  The patient remained stable.  The patient has also returned to the cardiac intensive care unit in stable condition.    ______________________________ Burna FortsJames T. Laruth Hanger, M.D.   ______________________________ Burna FortsJames T. Sherena Machorro, M.D.    JTM/MEDQ  D:  09/28/2015  T:  09/29/2015  Job:  [id number]

## 2015-10-05 NOTE — Progress Notes (Signed)
CARDIAC REHAB PHASE I  Pt eagerly awaiting discharge. Cardiac surgery discharge education completed with pt and mother at bedside. Reviewed IS, sternal precautions, activity progression, exercise, heart healthy diet/carb counting (pt will defer mostly to renal diet), daily weights and phase 2 cardiac rehab. Pt verbalized understanding. Pt agrees to phase 2 cardiac rehab referral, will send to [facility] per pt request. Pt sitting on edge of bed, call bell within reach, awaiting discharge.  [phone] Joylene GrapesEmily C Travaris Kosh, RN, BSN 10/05/2015 11:58 AM

## 2015-10-05 NOTE — Discharge Instructions (Signed)
Coronary Artery Bypass Grafting, Care After °Refer to this sheet in the next few weeks. These instructions provide you with information on caring for yourself after your procedure. Your health care provider may also give you more specific instructions. Your treatment has been planned according to current medical practices, but problems sometimes occur. Call your health care provider if you have any problems or questions after your procedure. °WHAT TO EXPECT AFTER THE PROCEDURE °Recovery from surgery will be different for everyone. Some people feel well after 3 or 4 weeks, while for others it takes longer. After your procedure, it is typical to have the following: °· Nausea and a lack of appetite.   °· Constipation. °· Weakness and fatigue.   °· Depression or irritability.   °· Pain or discomfort at your incision site. °HOME CARE INSTRUCTIONS °· Take medicines only as directed by your health care provider. Do not stop taking medicines or start any new medicines without first checking with your health care provider. °· Take your pulse as directed by your health care provider. °· Perform deep breathing as directed by your health care provider. If you were given a device called an incentive spirometer, use it to practice deep breathing several times a day. Support your chest with a pillow or your arms when you take deep breaths or cough. °· Keep incision areas clean, dry, and protected. Remove or change any bandages (dressings) only as directed by your health care provider. You may have skin adhesive strips over the incision areas. Do not take the strips off. They will fall off on their own. °· Check incision areas daily for any swelling, redness, or drainage. °· If incisions were made in your legs, do the following: °¨ Avoid crossing your legs.   °¨ Avoid sitting for long periods of time. Change positions every 30 minutes.   °¨ Elevate your legs when you are sitting. °· Wear compression stockings as directed by your  health care provider. These stockings help keep blood clots from forming in your legs. °· Take showers once your health care provider approves. Until then, only take sponge baths. Pat incisions dry. Do not rub incisions with a washcloth or towel. Do not take baths, swim, or use a hot tub until your health care provider approves. °· Eat foods that are high in fiber, such as raw fruits and vegetables, whole grains, beans, and nuts. Meats should be lean cut. Avoid canned, processed, and fried foods. °· Drink enough fluid to keep your urine clear or pale yellow. °· Weigh yourself every day. This helps identify if you are retaining fluid that may make your heart and lungs work harder. °· Rest and limit activity as directed by your health care provider. You may be instructed to: °¨ Stop any activity at once if you have chest pain, shortness of breath, irregular heartbeats, or dizziness. Get help right away if you have any of these symptoms. °¨ Move around frequently for short periods or take short walks as directed by your health care provider. Increase your activities gradually. You may need physical therapy or cardiac rehabilitation to help strengthen your muscles and build your endurance. °¨ Avoid lifting, pushing, or pulling anything heavier than 10 lb (4.5 kg) for at least 6 weeks after surgery. °· Do not drive until your health care provider approves.  °· Ask your health care provider when you may return to work. °· Ask your health care provider when you may resume sexual activity. °· Keep all follow-up visits as directed by your health care   provider. This is important. °SEEK MEDICAL CARE IF: °· You have swelling, redness, increasing pain, or drainage at the site of an incision. °· You have a fever. °· You have swelling in your ankles or legs. °· You have pain in your legs.   °· You gain 2 or more pounds (0.9 kg) a day. °· You are nauseous or vomit. °· You have diarrhea.  °SEEK IMMEDIATE MEDICAL CARE IF: °· You have  chest pain that goes to your jaw or arms. °· You have shortness of breath.   °· You have a fast or irregular heartbeat.   °· You notice a "clicking" in your breastbone (sternum) when you move.   °· You have numbness or weakness in your arms or legs. °· You feel dizzy or light-headed.   °MAKE SURE YOU: °· Understand these instructions. °· Will watch your condition. °· Will get help right away if you are not doing well or get worse. °  °This information is not intended to replace advice given to you by your health care provider. Make sure you discuss any questions you have with your health care provider. °  °Document Released: 07/08/2004 Document Revised: 01/09/2014 Document Reviewed: 05/28/2012 °Elsevier Interactive Patient Education ©2016 [employer] ° °Endoscopic Saphenous Vein Harvesting, Care After °Refer to this sheet in the next few weeks. These instructions provide you with information on caring for yourself after your procedure. Your health care provider may also give you more specific instructions. Your treatment has been planned according to current medical practices, but problems sometimes occur. Call your health care provider if you have any problems or questions after your procedure. °HOME CARE INSTRUCTIONS °Medicine °· Take whatever pain medicine your surgeon prescribes. Follow the directions carefully. Do not take over-the-counter pain medicine unless your surgeon says it is okay. Some pain medicine can cause bleeding problems for several weeks after surgery. °· Follow your surgeon's instructions about driving. You will probably not be permitted to drive after heart surgery. °· Take any medicines your surgeon prescribes. Any medicines you took before your heart surgery should be checked with your health care provider before you start taking them again. °Wound care °· If your surgeon has prescribed an elastic bandage or stocking, ask how long you should wear it. °· Check the area around your surgical  cuts (incisions) whenever your bandages (dressings) are changed. Look for any redness or swelling. °· You will need to return to have the stitches (sutures) or staples taken out. Ask your surgeon when to do that. °· Ask your surgeon when you can shower or bathe. °Activity °· Try to keep your legs raised when you are sitting. °· Do any exercises your health care providers have given you. These may include deep breathing exercises, coughing, walking, or other exercises. °SEEK MEDICAL CARE IF: °· You have any questions about your medicines. °· You have more leg pain, especially if your pain medicine stops working. °· New or growing bruises develop on your leg. °· Your leg swells, feels tight, or becomes red. °· You have numbness in your leg. °SEEK IMMEDIATE MEDICAL CARE IF: °· Your pain gets much worse. °· Blood or fluid leaks from any of the incisions. °· Your incisions become warm, swollen, or red. °· You have chest pain. °· You have trouble breathing. °· You have a fever. °· You have more pain near your leg incision. °MAKE SURE YOU: °· Understand these instructions. °· Will watch your condition. °· Will get help right away if you are not doing well or   get worse. °  °This information is not intended to replace advice given to you by your health care provider. Make sure you discuss any questions you have with your health care provider. °  °Document Released: 08/31/2010 Document Revised: 01/09/2014 Document Reviewed: 08/31/2010 °Elsevier Interactive Patient Education ©2016 [employer] ° ° °

## 2015-10-05 NOTE — Discharge Summary (Signed)
Physician Discharge Summary       [address]       [place] [address]             [phone]    Patient ID: Russell [name] MRN: [id number] DOB/AGE: 44-Feb-1972 44 y.o.  Admit date: 09/20/2015 Discharge date: 10/05/2015  Admission Diagnoses: 1. NSTEMI (non-ST elevated myocardial infarction) (HCC) 2.  Coronary artery disease involving native coronary artery of native heart with unstable angina pectoris (HCC)  Active Diagnoses:  1. Uncontrolled diabetes mellitus type 2 with peripheral artery disease (HCC) 2. Essential hypertension, benign 3. DKA (diabetic ketoacidoses) (HCC) 4. CKD stage 3 due to type 2 diabetes mellitus (HCC) 5. Age-related macular degeneration, wet, both eyes (HCC) 6. DVT (deep venous thrombosis), H/o 01/2014-on Xarelto 7. Hypothyroidism 8. Nephrotic syndrome 9. Depression 10. Anxiety 11. Asthma  Procedure (s):  Left Heart Cath and Coronary Angiography by Dr. Kirke Corin on 09/22/2015:  Conclusion     Ramus lesion, 90 %stenosed.  Ost 1st Diag to 1st Diag lesion, 95 %stenosed.  Prox LAD lesion, 80 %stenosed.  The left ventricular systolic function is normal.  LV end diastolic pressure is mildly elevated.  The left ventricular ejection fraction is 55-65% by visual estimate.   1. Severe two-vessel coronary arte involving a bifurcation proximal LAD/diagonal 1 and proximal large ramus. 2. Normal LV systolic function with mildly elevated LVEDP.   Recommendations: Given that the patient is diabetic with proximal bifurcation LAD involvement, I think the best option is CABG. I consulted CT surgery.  Resume heparin 8 hours after sheath pulled. I'm going to start nitroglycerin drip for blood pressure control and for mild chest pain.    Coronary artery bypass grafting x3 with the left internal mammary to the left anterior descending coronary artery, reverse saphenous vein graft to the diagonal coronary artery, reverse saphenous vein graft  to the obtuse marginal coronary artery with left thigh endo vein harvesting of the greater saphenous vei n by Dr. Tyrone Sage on 09/28/2015.  History of Presenting Illness: Russell [name] a 45 y.o.malewho presented with chest pain, hyperglycemia on 09/20/2015.  This past Friday at dialysis he had 7L removed which was unusual. Over the weekend, henoted  increasing head aches and chest pain intermittently that has progressively worsened. Because of these symptoms, he could not complete dialysis this past Monday and was admitted.  2 hours into his HD today, he noted he couldn't tolerate it any more.   He takes Dilaudid at home for "neuropathy" and any pain. Tylenol doesn't help with his pain. He notes he's been off all his anti-hypertensives for months as his blood pressure was too low at hemodialysis.   He takes Lantus 60 units BID. He notes he's been taking insulin regularly.  He underwent a cardiac catheterization by Dr. Kirke Corin on 09/22/2015. He was found to have coronary artery disease with a LVEF 55-65%. A cardiothoracic consultation was obtained with Dr. Tyrone Sage for the consideration of coronary artery bypass grafting surgery. Potential risks, benefits, and complications of the surgery were discussed with the patient and he agreed to proceed with surgery. Pre operative carotid duplex US showed no significant internal carotid artery stenosis bilaterally. He underwent a CABG x 3 on 09/28/2015.  Brief Hospital Course:  The patient was extubated the evening of surgery without difficulty. He/she remained afebrile and hemodynamically stable. He was weaned off of Dopamine and Neo Synephrine drips. [name], a line, chest tubes, and foley were removed early in the post operative  course. Lopressor was started and titrated accordingly. Nephrology followed the patient post op and arranged for HD. He had anemia post op, which was multifactorial. His H and H went as low as 7.4 and 23.9. He had a  transfusion previously. His last H and H was  7.7 and 24.6. He was weaned off the insulin drip.The patient's HGA1C pre op was  12.5. He will need close medical follow up his medical doctor after discharge. The patient was felt surgically stable for transfer;however, there was not a stepdown bed available so he remained in the ICU.  He continues to progress with cardiac rehab. He was ambulating on room air. He has been tolerating a diet and has had a bowel movement. Epicardial pacing wires and chest tube sutures will be removed prior to discharge. The patient is felt surgically stable for discharge today.   Latest Vital Signs: Blood pressure 127/71, pulse 81, temperature 97.6 F (36.4 C), temperature source Oral, resp. rate 19, height 6\' 2"  (1.88 m), weight 253 lb 12 oz (115.1 kg), SpO2 95 %.  Physical Exam: General appearance: alert and cooperative Neurologic: intact Heart: regular rate and rhythm, S1, S2 normal, no murmur, click, rub or gallop Lungs: clear to auscultation bilaterally Abdomen: soft, non-tender; bowel sounds normal; no masses,  no organomegaly Extremities: extremities normal, atraumatic, no cyanosis or edema and Homans sign is negative, no sign of DVT Wound: sternum inatct and wound healing well  Discharge Condition: Stable and discharged to home.  Recent laboratory studies:  Lab Results  Component Value Date   WBC 6.8 10/05/2015   HGB 7.9 (L) 10/05/2015   HCT 25.5 (L) 10/05/2015   MCV 89.2 10/05/2015   PLT 319 10/05/2015   Lab Results  Component Value Date   NA 135 10/05/2015   K 3.9 10/05/2015   CL 97 (L) 10/05/2015   CO2 30 10/05/2015   CREATININE 6.00 (H) 10/05/2015   GLUCOSE 139 (H) 10/05/2015   Diagnostic Studies: Dg Chest 2 View  Result Date: 10/05/2015 CLINICAL DATA:  Status post CABG 7 days ago. EXAM: CHEST  2 VIEW COMPARISON:  Portable chest x-ray of October 03, 2015 FINDINGS: The lungs are mildly hypoinflated. There is persistent density in the left  lower lobe with small left pleural effusion layering posteriorly. There is density in the right upper lung due to chronic hypertrophy of the first sternal costal junction. There is no pneumothorax. The heart is top-normal in size. The pulmonary vascularity is normal. The pulmonary interstitial markings have normalized. The sternal wires are intact. The retrosternal soft tissues are normal. IMPRESSION: Interval resolution of pulmonary interstitial edema. Persistent small area of left lower lobe atelectasis with small left pleural effusion. No pneumothorax. Electronically Signed   By: David  [place]and M.D.   On: 10/05/2015 07:16    Discharge Instructions    Amb Referral to Cardiac Rehabilitation    Complete by:  As directed    Diagnosis:   CABG NSTEMI     CABG X ___:  3      Discharge Medications:   Medication List    STOP taking these medications   metoprolol succinate 100 MG 24 hr tablet Commonly known as:  TOPROL-XL     TAKE these medications   acetaminophen 500 MG tablet Commonly known as:  TYLENOL Take 1 tablet (500 mg total) by mouth every 6 (six) hours as needed for moderate pain. What changed:  how much to take   albuterol 108 (90 Base) MCG/ACT inhaler Commonly known  as:  PROVENTIL HFA;VENTOLIN HFA Inhale 1 puff into the lungs every 6 (six) hours as needed for wheezing or shortness of breath.   albuterol-ipratropium 18-103 MCG/ACT inhaler Commonly known as:  COMBIVENT Inhale 2 puffs into the lungs every 4 (four) hours. What changed:  when to take this  reasons to take this   amLODipine 10 MG tablet Commonly known as:  NORVASC Take 1 tablet (10 mg total) by mouth daily.   aspirin 81 MG EC tablet Take 1 tablet (81 mg total) by mouth daily. Start taking on:  10/06/2015   atorvastatin 80 MG tablet Commonly known as:  LIPITOR Take 1 tablet (80 mg total) by mouth daily at 6 PM.   calcitRIOL 0.25 MCG capsule Commonly known as:  ROCALTROL Take 1 capsule (0.25 mcg  total) by mouth every Monday, Wednesday, and Friday with hemodialysis. Start taking on:  10/06/2015   calcium acetate 667 MG capsule Commonly known as:  PHOSLO Take 2 capsules (1,334 mg total) by mouth 3 (three) times daily with meals.   citalopram 40 MG tablet Commonly known as:  CELEXA Take 1.5 tablets (60 mg total) by mouth at bedtime.   clonazePAM 2 MG tablet Commonly known as:  KLONOPIN Take 2 mg by mouth at bedtime.   cloNIDine 0.1 MG tablet Commonly known as:  CATAPRES Take 1 tablet (0.1 mg total) by mouth 2 (two) times daily. What changed:  how much to take   clopidogrel 75 MG tablet Commonly known as:  PLAVIX Take 1 tablet (75 mg total) by mouth daily. Start taking on:  10/06/2015   cyclobenzaprine 10 MG tablet Commonly known as:  FLEXERIL Take 1 tablet (10 mg total) by mouth at bedtime.   gabapentin 100 MG capsule Commonly known as:  NEURONTIN Take 2 capsules (200 mg total) by mouth 3 (three) times daily. What changed:  how much to take  when to take this  reasons to take this   HYDROcodone-acetaminophen 5-325 MG tablet Commonly known as:  NORCO/VICODIN Take 1 tablet by mouth every 4 (four) hours as needed for pain.   HYDROmorphone 2 MG tablet Commonly known as:  DILAUDID Take 1 tablet (2 mg total) by mouth every 8 (eight) hours as needed for severe pain. What changed:  how much to take   hydrOXYzine 25 MG tablet Commonly known as:  ATARAX/VISTARIL Take 25 mg by mouth 2 (two) times daily as needed for anxiety.   insulin glargine 100 UNIT/ML injection Commonly known as:  LANTUS Inject 60 Units into the skin 2 (two) times daily.   insulin lispro 100 UNIT/ML injection Commonly known as:  HUMALOG Inject 15-30 Units into the skin every evening. Take one unit per 5 carbs depending on dinner.   levothyroxine 50 MCG tablet Commonly known as:  SYNTHROID, LEVOTHROID Take 1 tablet (50 mcg total) by mouth daily.   metoCLOPramide 5 MG tablet Commonly known  as:  REGLAN Take 1 tablet (5 mg total) by mouth every 8 (eight) hours as needed for nausea.   metoprolol tartrate 25 MG tablet Commonly known as:  LOPRESSOR Take 0.5 tablets (12.5 mg total) by mouth 2 (two) times daily.   omeprazole 40 MG capsule Commonly known as:  PRILOSEC Take 1 capsule (40 mg total) by mouth 2 (two) times daily.   polyethylene glycol packet Commonly known as:  MIRALAX Take 17 g by mouth daily.   promethazine 25 MG tablet Commonly known as:  PHENERGAN Take 1 tablet (25 mg total) by mouth every 8 (  eight) hours as needed for nausea.   terbinafine 250 MG tablet Commonly known as:  LAMISIL Take 1 tablet (250 mg total) by mouth daily.      The patient has been discharged on:   1.Beta Blocker:  Yes [  x ]                              No   [   ]                              If No, reason:  2.Ace Inhibitor/ARB: Yes [   ]                                     No  [  x  ]                                     If No, reason: ESRD  3.Statin:   Yes [  x ]                  No  [   ]                  If No, reason:  4.Ecasa:  Yes  [  x ]                  No   [   ]                  If No, reason: Follow Up Appointments: Follow-up Information    Delight Ovens, MD Follow up on 11/04/2015.   Specialty:  Cardiothoracic Surgery Why:  Appointment is at 12:30, please get CXR at [facility] imaging at 12:00 located on the first floor of our office building Contact information: [address] [place] [address] [phone]        Theodore Demark, PA-C Follow up on 10/19/2015.   Specialties:  Cardiology, Radiology Why:  With Dr. Ludwig Clarks PA at 1:30pm for hospital follow up. Contact information: [address] [place] [address] [phone]           Signed: Doree Fudge MPA-C 10/05/2015, 1:32 PM

## 2015-10-05 NOTE — Progress Notes (Signed)
Assessment unchanged. Discussed D/C instructions with pt including f/u appointments and new medications. Verbalized understanding. RX given to pt. IV and tele removed. Pt left with belongings by [employer].

## 2015-10-05 NOTE — Progress Notes (Signed)
Dialysis Orders:  [facility] MWF 4h  117kg  2/2.5 bath  P4  Hep 8000 +3000 mid Rx Mircera 75 mcg IV q 2 weeks (last dose 09/15/15 Has HGB 10.0 09/15/15) Venofer 50 mg IV weekly (last dose 09*131/17 Fe 75 Tsat 29 08/18/15) Calcitriol 0.25 mcg PO q treatment (last PTH 424 08/18/15)  Ca 8.7 C Ca 8.9 Phos 7.6 (08/18/15) Takes Phoslo 667 mg 3 capsules PO TID AC and 2 capsules with snacks.  Assessment: 1. 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   2. Volume - is under dry wt slightly. Will lower to 115.5kg at home.   3. HTN: was on norvasc/ MTP/ clon at home, here is getting MTP 12.5 bid per cards. Defer to cards concerning BP meds.   4. ESRD-MWF 5. Anemia- HGB 9.3 9/24 -Rec'd Aranesp 40 mcg IV 09/20- have increased to 100 9/27; transfuse prn after surgery, Hgb 7.4 10/1, starting iron load on 10/2, have added on iron and TIBC 6. Secondary hyperparathyroidism- P 6.7/bindersVDRA. Cca 10.1, use 2 Ca bath 7. Nutrition- Albumin 2.8. PCM in the setting of obesity. Renal/Carb mod diet. Nepro/renal vit 8. DM: per primary   Plan - for dc home, will get HD at OP unit tomorrow. Lower dry wt 115.5kg.  BP meds reduced.   Vinson Moselleob Arial Galligan MD [facility] pager [phone]   10/05/2015, 9:40 AM       Subjective:   Nausea improved, diet advanced.    Objective Vitals:   10/05/15 0437 10/05/15 0530 10/05/15 0700 10/05/15 0900  BP:    127/71  Pulse: 81     Resp: 16 17  19   Temp:   97.6 F (36.4 C)   TempSrc:   Oral   SpO2: 94%   95%  Weight:  115.1 kg (253 lb 12 oz)    Height:       Physical Exam General: lying in bed, sleeping Neck: supple, thick, no over JVD Heart: regular, no r/g.   Lungs: improved air movement bilaterally Abdomen: soft NT, nd Extremities: 1+ LE edema Dialysis Access:  Left AVF + bruit   Additional Objective Labs: Basic Metabolic Panel:  Recent Labs Lab 10/01/15 1540  10/03/15 1158 10/04/15 0504 10/05/15 0339  NA 135  <  > 131* 132* 135  K 4.0  < > 3.8 4.0 3.9  CL 97*  < > 94* 95* 97*  CO2 25  < > 27 24 30   GLUCOSE 278*  < > 195* 115* 139*  BUN 37*  < > 75* 89* 44*  CREATININE 5.58*  < > 8.61* 9.49* 6.00*  CALCIUM 8.6*  < > 7.9* 8.0* 7.6*  PHOS 5.8*  --  6.6*  --   --   < > = values in this interval not displayed. Liver Function Tests:  Recent Labs Lab 09/30/15 0549 10/01/15 1540 10/03/15 1158  AST 25  --   --   ALT 22  --   --   ALKPHOS 57  --   --   BILITOT 0.7  --   --   PROT 6.2*  --   --   ALBUMIN 3.1* 2.8* 2.5*   No results for input(s): LIPASE, AMYLASE in the last 168 hours. CBC:  Recent Labs Lab 10/02/15 0635 10/03/15 0250 10/03/15 1158 10/04/15 0504 10/05/15 0339  WBC 11.3* 8.5 8.3 7.3 6.8  HGB 8.2* 7.5* 8.0* 7.7* 7.9*  HCT 26.6* 23.7* 26.1* 24.6* 25.5*  MCV 89.9 89.4  90.0 88.8 89.2  PLT 243 260 290 305 319   Blood Culture No results found for: SDES, SPECREQUEST, CULT, REPTSTATUS  Cardiac Enzymes: No results for input(s): CKTOTAL, CKMB, CKMBINDEX, TROPONINI in the last 168 hours. CBG:  Recent Labs Lab 10/04/15 0804 10/04/15 1145 10/04/15 1821 10/04/15 2130 10/05/15 0826  GLUCAP 124* 201* 101* 191* 139*   Iron Studies:   Recent Labs  10/03/15 1158  IRON 29*  TIBC 209*  FERRITIN 238   Lab Results  Component Value Date   INR 1.33 09/28/2015   INR 1.06 09/28/2015   INR 1.01 09/27/2015   Studies/Results: Dg Chest 2 View  Result Date: 10/05/2015 CLINICAL DATA:  Status post CABG 7 days ago. EXAM: CHEST  2 VIEW COMPARISON:  Portable chest x-ray of October 03, 2015 FINDINGS: The lungs are mildly hypoinflated. There is persistent density in the left lower lobe with small left pleural effusion layering posteriorly. There is density in the right upper lung due to chronic hypertrophy of the first sternal costal junction. There is no pneumothorax. The heart is top-normal in size. The pulmonary vascularity is normal. The pulmonary interstitial markings have  normalized. The sternal wires are intact. The retrosternal soft tissues are normal. IMPRESSION: Interval resolution of pulmonary interstitial edema. Persistent small area of left lower lobe atelectasis with small left pleural effusion. No pneumothorax. Electronically Signed   By: David  [place]iland M.D.   On: 10/05/2015 07:16   Medications:   . aspirin EC  81 mg Oral Daily   Or  . aspirin  81 mg Per Tube Daily  . atorvastatin  80 mg Oral q1800  . bisacodyl  10 mg Oral Daily   Or  . bisacodyl  10 mg Rectal Daily  . calcitRIOL  0.25 mcg Oral Q M,W,F-HD  . calcium acetate  2,001 mg Oral TID WC  . clonazePAM  1 mg Oral QHS  . clopidogrel  75 mg Oral Daily  . darbepoetin (ARANESP) injection - DIALYSIS  100 mcg Intravenous Q Wed-HD  . enoxaparin (LOVENOX) injection  30 mg Subcutaneous Q24H  . ferric gluconate (FERRLECIT/NULECIT) IV  125 mg Intravenous Q M,W,F-HD  . gabapentin  200 mg Oral TID  . insulin aspart  0-5 Units Subcutaneous QHS  . insulin aspart  0-9 Units Subcutaneous TID WC  . insulin aspart  3 Units Subcutaneous TID WC  . insulin detemir  25 Units Subcutaneous Daily  . levothyroxine  50 mcg Oral QAC breakfast  . metoprolol tartrate  12.5 mg Oral BID  . multivitamin  1 tablet Oral QHS  . pantoprazole  40 mg Oral Daily  . sodium chloride flush  3 mL Intravenous Q12H

## 2015-10-05 NOTE — Progress Notes (Signed)
Patient ID: Russell BirkenheadSteven B [name], male   DOB: 1970/08/02, 45 y.o.   MRN: [id number] TCTS DAILY ICU PROGRESS NOTE                   [address]            [place] [id number]          [phone]   7 Days Post-Op Procedure(s) (LRB): CORONARY ARTERY BYPASS GRAFTING (CABG) x 3 UTILIZING LEFT MAMMARY ARTERY AND ENDOSCOPICALLY HARVESTED LEFT GREATER SAPHENOUS VEIN. (N/A) TRANSESOPHAGEAL ECHOCARDIOGRAM (TEE) (N/A) ENDOVEIN HARVEST OF GREATER SAPHENOUS VEIN (Left)  Total Length of Stay:  LOS: 15 days   Subjective: Still waiting for step down bed, ambulating well, feels better   Objective: Vital signs in last 24 hours: Temp:  [97.5 F (36.4 C)-98.7 F (37.1 C)] 98.4 F (36.9 C) (10/03 0430) Pulse Rate:  [81-95] 81 (10/03 0437) Cardiac Rhythm: Normal sinus rhythm (10/02 2000) Resp:  [13-22] 17 (10/03 0530) BP: (106-150)/(60-84) 120/80 (10/03 0436) SpO2:  [93 %-98 %] 94 % (10/03 0437) Weight:  [253 lb 12 oz (115.1 kg)-267 lb 6.7 oz (121.3 kg)] 253 lb 12 oz (115.1 kg) (10/03 0530)  Filed Weights   10/04/15 1347 10/04/15 1740 10/05/15 0530  Weight: 267 lb 6.7 oz (121.3 kg) 263 lb 14.3 oz (119.7 kg) 253 lb 12 oz (115.1 kg)    Weight change: 7 lb 15 oz (3.6 kg)   Hemodynamic parameters for last 24 hours:    Intake/Output from previous day: 10/02 0701 - 10/03 0700 In: -  Out: 2600 [Urine:600]  Intake/Output this shift: No intake/output data recorded.  Current Meds: Scheduled Meds: . aspirin EC  325 mg Oral Daily   Or  . aspirin  324 mg Per Tube Daily  . atorvastatin  80 mg Oral q1800  . bisacodyl  10 mg Oral Daily   Or  . bisacodyl  10 mg Rectal Daily  . calcitRIOL  0.25 mcg Oral Q M,W,F-HD  . calcium acetate  2,001 mg Oral TID WC  . clonazePAM  1 mg Oral QHS  . clopidogrel  75 mg Oral Daily  . darbepoetin (ARANESP) injection - DIALYSIS  100 mcg Intravenous Q Wed-HD  . enoxaparin (LOVENOX) injection  30 mg Subcutaneous Q24H  . ferric gluconate  (FERRLECIT/NULECIT) IV  125 mg Intravenous Q M,W,F-HD  . gabapentin  200 mg Oral TID  . insulin aspart  0-5 Units Subcutaneous QHS  . insulin aspart  0-9 Units Subcutaneous TID WC  . insulin aspart  3 Units Subcutaneous TID WC  . insulin detemir  25 Units Subcutaneous Daily  . levothyroxine  50 mcg Oral QAC breakfast  . metoprolol tartrate  12.5 mg Oral BID  . multivitamin  1 tablet Oral QHS  . pantoprazole  40 mg Oral Daily  . sodium chloride flush  3 mL Intravenous Q12H   Continuous Infusions:  PRN Meds:.sodium chloride, acetaminophen, alteplase, heparin, heparin, levalbuterol, lidocaine (PF), lidocaine-prilocaine, metoCLOPramide (REGLAN) injection, metoprolol, ondansetron (ZOFRAN) IV, oxyCODONE, pentafluoroprop-tetrafluoroeth, sodium chloride flush  General appearance: alert and cooperative Neurologic: intact Heart: regular rate and rhythm, S1, S2 normal, no murmur, click, rub or gallop Lungs: clear to auscultation bilaterally Abdomen: soft, non-tender; bowel sounds normal; no masses,  no organomegaly Extremities: extremities normal, atraumatic, no cyanosis or edema and Homans sign is negative, no sign of DVT Wound: sternum inatct and wound healing well  Lab Results: CBC: Recent Labs  10/04/15 0504 10/05/15 0339  WBC 7.3 6.8  HGB 7.7* 7.9*  HCT 24.6* 25.5*  PLT 305 319   BMET:  Recent Labs  10/04/15 0504 10/05/15 0339  NA 132* 135  K 4.0 3.9  CL 95* 97*  CO2 24 30  GLUCOSE 115* 139*  BUN 89* 44*  CREATININE 9.49* 6.00*  CALCIUM 8.0* 7.6*    CMET: Lab Results  Component Value Date   WBC 6.8 10/05/2015   HGB 7.9 (L) 10/05/2015   HCT 25.5 (L) 10/05/2015   PLT 319 10/05/2015   GLUCOSE 139 (H) 10/05/2015   CHOL 203 (H) 09/21/2015   TRIG 294 (H) 09/21/2015   HDL 33 (L) 09/21/2015   LDLCALC 111 (H) 09/21/2015   ALT 22 09/30/2015   AST 25 09/30/2015   NA 135 10/05/2015   K 3.9 10/05/2015   CL 97 (L) 10/05/2015   CREATININE 6.00 (H) 10/05/2015   BUN 44 (H)  10/05/2015   CO2 30 10/05/2015   TSH 1.243 07/20/2015   INR 1.33 09/28/2015   HGBA1C 12.5 (H) 09/28/2015   MICROALBUR 24.84 (H) 12/09/2012    PT/INR: No results for input(s): LABPROT, INR in the last 72 hours. Radiology: Dg Chest 2 View  Result Date: 10/05/2015 CLINICAL DATA:  Status post CABG 7 days ago. EXAM: CHEST  2 VIEW COMPARISON:  Portable chest x-ray of October 03, 2015 FINDINGS: The lungs are mildly hypoinflated. There is persistent density in the left lower lobe with small left pleural effusion layering posteriorly. There is density in the right upper lung due to chronic hypertrophy of the first sternal costal junction. There is no pneumothorax. The heart is top-normal in size. The pulmonary vascularity is normal. The pulmonary interstitial markings have normalized. The sternal wires are intact. The retrosternal soft tissues are normal. IMPRESSION: Interval resolution of pulmonary interstitial edema. Persistent small area of left lower lobe atelectasis with small left pleural effusion. No pneumothorax. Electronically Signed   By: David  [place] M.D.   On: 10/05/2015 07:16     Assessment/Plan: S/P Procedure(s) (LRB): CORONARY ARTERY BYPASS GRAFTING (CABG) x 3 UTILIZING LEFT MAMMARY ARTERY AND ENDOSCOPICALLY HARVESTED LEFT GREATER SAPHENOUS VEIN. (N/A) TRANSESOPHAGEAL ECHOCARDIOGRAM (TEE) (N/A) ENDOVEIN HARVEST OF GREATER SAPHENOUS VEIN (Left) Pacing wires out plan d/c home today and resume dialysis as outpatient tomorrow  Started on Plavix and will be on low dose asa Was not on ACE preop was on clonidine - ACE/BP meds per cardiology and renal   Delight Ovens 10/05/2015 7:31 AM

## 2015-10-06 DIAGNOSIS — D631 Anemia in chronic kidney disease: Secondary | ICD-10-CM | POA: Diagnosis not present

## 2015-10-06 DIAGNOSIS — N186 End stage renal disease: Secondary | ICD-10-CM | POA: Diagnosis not present

## 2015-10-06 DIAGNOSIS — E211 Secondary hyperparathyroidism, not elsewhere classified: Secondary | ICD-10-CM | POA: Diagnosis not present

## 2015-10-06 DIAGNOSIS — E1121 Type 2 diabetes mellitus with diabetic nephropathy: Secondary | ICD-10-CM | POA: Diagnosis not present

## 2015-10-08 DIAGNOSIS — N186 End stage renal disease: Secondary | ICD-10-CM | POA: Diagnosis not present

## 2015-10-08 DIAGNOSIS — D631 Anemia in chronic kidney disease: Secondary | ICD-10-CM | POA: Diagnosis not present

## 2015-10-08 DIAGNOSIS — E1121 Type 2 diabetes mellitus with diabetic nephropathy: Secondary | ICD-10-CM | POA: Diagnosis not present

## 2015-10-08 DIAGNOSIS — E211 Secondary hyperparathyroidism, not elsewhere classified: Secondary | ICD-10-CM | POA: Diagnosis not present

## 2015-10-10 DIAGNOSIS — I120 Hypertensive chronic kidney disease with stage 5 chronic kidney disease or end stage renal disease: Secondary | ICD-10-CM | POA: Diagnosis not present

## 2015-10-10 DIAGNOSIS — E1151 Type 2 diabetes mellitus with diabetic peripheral angiopathy without gangrene: Secondary | ICD-10-CM | POA: Diagnosis not present

## 2015-10-10 DIAGNOSIS — J45909 Unspecified asthma, uncomplicated: Secondary | ICD-10-CM | POA: Diagnosis not present

## 2015-10-10 DIAGNOSIS — Z48812 Encounter for surgical aftercare following surgery on the circulatory system: Secondary | ICD-10-CM | POA: Diagnosis not present

## 2015-10-10 DIAGNOSIS — F329 Major depressive disorder, single episode, unspecified: Secondary | ICD-10-CM | POA: Diagnosis not present

## 2015-10-10 DIAGNOSIS — E1165 Type 2 diabetes mellitus with hyperglycemia: Secondary | ICD-10-CM | POA: Diagnosis not present

## 2015-10-10 DIAGNOSIS — R51 Headache: Secondary | ICD-10-CM | POA: Diagnosis not present

## 2015-10-10 DIAGNOSIS — Z86718 Personal history of other venous thrombosis and embolism: Secondary | ICD-10-CM | POA: Diagnosis not present

## 2015-10-10 DIAGNOSIS — H353230 Exudative age-related macular degeneration, bilateral, stage unspecified: Secondary | ICD-10-CM | POA: Diagnosis not present

## 2015-10-10 DIAGNOSIS — Z7902 Long term (current) use of antithrombotics/antiplatelets: Secondary | ICD-10-CM | POA: Diagnosis not present

## 2015-10-10 DIAGNOSIS — I25110 Atherosclerotic heart disease of native coronary artery with unstable angina pectoris: Secondary | ICD-10-CM | POA: Diagnosis not present

## 2015-10-10 DIAGNOSIS — Z7982 Long term (current) use of aspirin: Secondary | ICD-10-CM | POA: Diagnosis not present

## 2015-10-10 DIAGNOSIS — E1142 Type 2 diabetes mellitus with diabetic polyneuropathy: Secondary | ICD-10-CM | POA: Diagnosis not present

## 2015-10-10 DIAGNOSIS — E1122 Type 2 diabetes mellitus with diabetic chronic kidney disease: Secondary | ICD-10-CM | POA: Diagnosis not present

## 2015-10-10 DIAGNOSIS — N186 End stage renal disease: Secondary | ICD-10-CM | POA: Diagnosis not present

## 2015-10-10 DIAGNOSIS — Z794 Long term (current) use of insulin: Secondary | ICD-10-CM | POA: Diagnosis not present

## 2015-10-10 DIAGNOSIS — D638 Anemia in other chronic diseases classified elsewhere: Secondary | ICD-10-CM | POA: Diagnosis not present

## 2015-10-10 DIAGNOSIS — Z951 Presence of aortocoronary bypass graft: Secondary | ICD-10-CM | POA: Diagnosis not present

## 2015-10-11 DIAGNOSIS — D631 Anemia in chronic kidney disease: Secondary | ICD-10-CM | POA: Diagnosis not present

## 2015-10-11 DIAGNOSIS — I25110 Atherosclerotic heart disease of native coronary artery with unstable angina pectoris: Secondary | ICD-10-CM | POA: Diagnosis not present

## 2015-10-11 DIAGNOSIS — E1122 Type 2 diabetes mellitus with diabetic chronic kidney disease: Secondary | ICD-10-CM | POA: Diagnosis not present

## 2015-10-11 DIAGNOSIS — N186 End stage renal disease: Secondary | ICD-10-CM | POA: Diagnosis not present

## 2015-10-11 DIAGNOSIS — I120 Hypertensive chronic kidney disease with stage 5 chronic kidney disease or end stage renal disease: Secondary | ICD-10-CM | POA: Diagnosis not present

## 2015-10-11 DIAGNOSIS — Z48812 Encounter for surgical aftercare following surgery on the circulatory system: Secondary | ICD-10-CM | POA: Diagnosis not present

## 2015-10-11 DIAGNOSIS — E1121 Type 2 diabetes mellitus with diabetic nephropathy: Secondary | ICD-10-CM | POA: Diagnosis not present

## 2015-10-11 DIAGNOSIS — E211 Secondary hyperparathyroidism, not elsewhere classified: Secondary | ICD-10-CM | POA: Diagnosis not present

## 2015-10-11 DIAGNOSIS — E1165 Type 2 diabetes mellitus with hyperglycemia: Secondary | ICD-10-CM | POA: Diagnosis not present

## 2015-10-13 DIAGNOSIS — E1121 Type 2 diabetes mellitus with diabetic nephropathy: Secondary | ICD-10-CM | POA: Diagnosis not present

## 2015-10-13 DIAGNOSIS — D631 Anemia in chronic kidney disease: Secondary | ICD-10-CM | POA: Diagnosis not present

## 2015-10-13 DIAGNOSIS — E1122 Type 2 diabetes mellitus with diabetic chronic kidney disease: Secondary | ICD-10-CM | POA: Diagnosis not present

## 2015-10-13 DIAGNOSIS — I25110 Atherosclerotic heart disease of native coronary artery with unstable angina pectoris: Secondary | ICD-10-CM | POA: Diagnosis not present

## 2015-10-13 DIAGNOSIS — I120 Hypertensive chronic kidney disease with stage 5 chronic kidney disease or end stage renal disease: Secondary | ICD-10-CM | POA: Diagnosis not present

## 2015-10-13 DIAGNOSIS — E1165 Type 2 diabetes mellitus with hyperglycemia: Secondary | ICD-10-CM | POA: Diagnosis not present

## 2015-10-13 DIAGNOSIS — Z48812 Encounter for surgical aftercare following surgery on the circulatory system: Secondary | ICD-10-CM | POA: Diagnosis not present

## 2015-10-13 DIAGNOSIS — E211 Secondary hyperparathyroidism, not elsewhere classified: Secondary | ICD-10-CM | POA: Diagnosis not present

## 2015-10-13 DIAGNOSIS — N186 End stage renal disease: Secondary | ICD-10-CM | POA: Diagnosis not present

## 2015-10-15 DIAGNOSIS — E1122 Type 2 diabetes mellitus with diabetic chronic kidney disease: Secondary | ICD-10-CM | POA: Diagnosis not present

## 2015-10-15 DIAGNOSIS — E1121 Type 2 diabetes mellitus with diabetic nephropathy: Secondary | ICD-10-CM | POA: Diagnosis not present

## 2015-10-15 DIAGNOSIS — E211 Secondary hyperparathyroidism, not elsewhere classified: Secondary | ICD-10-CM | POA: Diagnosis not present

## 2015-10-15 DIAGNOSIS — I120 Hypertensive chronic kidney disease with stage 5 chronic kidney disease or end stage renal disease: Secondary | ICD-10-CM | POA: Diagnosis not present

## 2015-10-15 DIAGNOSIS — N186 End stage renal disease: Secondary | ICD-10-CM | POA: Diagnosis not present

## 2015-10-15 DIAGNOSIS — Z48812 Encounter for surgical aftercare following surgery on the circulatory system: Secondary | ICD-10-CM | POA: Diagnosis not present

## 2015-10-15 DIAGNOSIS — E1165 Type 2 diabetes mellitus with hyperglycemia: Secondary | ICD-10-CM | POA: Diagnosis not present

## 2015-10-15 DIAGNOSIS — D631 Anemia in chronic kidney disease: Secondary | ICD-10-CM | POA: Diagnosis not present

## 2015-10-15 DIAGNOSIS — I25110 Atherosclerotic heart disease of native coronary artery with unstable angina pectoris: Secondary | ICD-10-CM | POA: Diagnosis not present

## 2015-10-18 DIAGNOSIS — E211 Secondary hyperparathyroidism, not elsewhere classified: Secondary | ICD-10-CM | POA: Diagnosis not present

## 2015-10-18 DIAGNOSIS — D631 Anemia in chronic kidney disease: Secondary | ICD-10-CM | POA: Diagnosis not present

## 2015-10-18 DIAGNOSIS — N186 End stage renal disease: Secondary | ICD-10-CM | POA: Diagnosis not present

## 2015-10-18 DIAGNOSIS — E1121 Type 2 diabetes mellitus with diabetic nephropathy: Secondary | ICD-10-CM | POA: Diagnosis not present

## 2015-10-19 ENCOUNTER — Encounter: Payer: Self-pay | Admitting: Physician Assistant

## 2015-10-19 ENCOUNTER — Ambulatory Visit (INDEPENDENT_AMBULATORY_CARE_PROVIDER_SITE_OTHER): Payer: Medicare Other | Admitting: Physician Assistant

## 2015-10-19 VITALS — BP 118/86 | HR 106 | Ht 74.0 in | Wt 264.4 lb

## 2015-10-19 DIAGNOSIS — E1122 Type 2 diabetes mellitus with diabetic chronic kidney disease: Secondary | ICD-10-CM | POA: Diagnosis not present

## 2015-10-19 DIAGNOSIS — I252 Old myocardial infarction: Secondary | ICD-10-CM

## 2015-10-19 DIAGNOSIS — Z48812 Encounter for surgical aftercare following surgery on the circulatory system: Secondary | ICD-10-CM | POA: Diagnosis not present

## 2015-10-19 DIAGNOSIS — I2510 Atherosclerotic heart disease of native coronary artery without angina pectoris: Secondary | ICD-10-CM | POA: Diagnosis not present

## 2015-10-19 DIAGNOSIS — I10 Essential (primary) hypertension: Secondary | ICD-10-CM | POA: Diagnosis not present

## 2015-10-19 DIAGNOSIS — Z951 Presence of aortocoronary bypass graft: Secondary | ICD-10-CM

## 2015-10-19 DIAGNOSIS — N186 End stage renal disease: Secondary | ICD-10-CM | POA: Diagnosis not present

## 2015-10-19 DIAGNOSIS — E785 Hyperlipidemia, unspecified: Secondary | ICD-10-CM

## 2015-10-19 DIAGNOSIS — I120 Hypertensive chronic kidney disease with stage 5 chronic kidney disease or end stage renal disease: Secondary | ICD-10-CM | POA: Diagnosis not present

## 2015-10-19 DIAGNOSIS — E1165 Type 2 diabetes mellitus with hyperglycemia: Secondary | ICD-10-CM | POA: Diagnosis not present

## 2015-10-19 DIAGNOSIS — I25110 Atherosclerotic heart disease of native coronary artery with unstable angina pectoris: Secondary | ICD-10-CM | POA: Diagnosis not present

## 2015-10-19 NOTE — Patient Instructions (Signed)
Medication Instructions:   NO CHANGE.   Follow-Up: Your physician recommends that you schedule a follow-up appointment in: 3 MONTHS WITH DR CRENSHAW.   If you need a refill on your cardiac medications before your next appointment, please call your pharmacy.   

## 2015-10-19 NOTE — Progress Notes (Signed)
Cardiology Office Note   Date:  10/19/2015   ID:  [name], DOB 1970-12-29, MRN [id number]  PCP:  [name], MD  Cardiologist:  Dr. Rosemarie Beath, PA-C    Chief Complaint  Patient presents with  . Follow-up    History of Present Illness: Russell [name] is a 45 y.o. male with a history of NSTEMI >CABG 09/28/2015 w/ LIMA-LAD, SVG-Diag, SVG-OM, EF nl by echo, ESRD on HD, DM, CKD III, DVT 12/2014>Completed Xarelto, hypothyroid, depression  DC 10/05/2015 after bypass surgery.  Purvis Sheffield Rudman presents for follow-up.   In general, he is doing very well. He dialyzes on Monday Wednesday and Friday. He tolerates dialysis well. He has been working on his health. His blood sugars are well controlled and he is taking Lantus insulin, but not as much as previously.   He is trying to keep a good activity level and feels he is doing that. He is compliant with his other medications. His blood pressure has been low enough that he is off amlodipine 10 mg and clonidine 0.1 mg 3 times a day that he was previously taking. He is still taking metoprolol 12.5 mg twice a day. His blood pressure tends to be low towards the end of dialysis, but otherwise he doesn't have any problems with his blood pressure. His heart rate is elevated today, but he has no awareness of his heart rate.   His lipids are followed by his endocrinologist along with his diabetes. He is requested to obtain copies of those records.  He never gets palpitations. Except when his blood pressures low during dialysis, he does not get lightheaded or dizzy. His volume status is managed well by dialysis. He is having no issues or concerns with this.  He wonders when he can drive. He is willing to attend cardiac rehabilitation.   Past Medical History:  Diagnosis Date  . Age-related macular degeneration, wet, both eyes (HCC)    "I'm getting Alia treatments" (12/30/2014)  . Anxiety   . Arthritis    "hands"  (12/30/2014)  . Asthma   . 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 Daily headache   . Depression   . DVT (deep venous thrombosis), H/o 01/2014-on Xarelto 03/24/2014   LLE  . Hypertension   . Hypothyroidism   . Nephrotic syndrome 05/18/2014  . NSTEMI (non-ST elevated myocardial infarction) (HCC)   . Pneumonia 11/2013  . Renal insufficiency   . Secondary DM with DKA-AG=16, BIcarb Nl 03/24/2014  . Type 2 diabetes mellitus with diabetic nephropathy [facility])     Past Surgical History:  Procedure Laterality Date  . ANKLE FRACTURE SURGERY Right 1988  . AV FISTULA PLACEMENT Left 01/01/2015   Procedure: CREATION OF LEFT RADIAL CEPHALIC ARTERIOVENOUS (AV) FISTULA ;  Surgeon: Pryor Ochoa, MD;  Location: [facility] OR;  Service: Vascular;  Laterality: Left;  . CARDIAC CATHETERIZATION N/A 09/22/2015   Procedure: Left Heart Cath and Coronary Angiography;  Surgeon: Iran Ouch, MD;  Location: [facility] INVASIVE CV LAB;  Service: Cardiovascular;  Laterality: N/A;  . CORONARY ARTERY BYPASS GRAFT N/A 09/28/2015   Procedure: CORONARY ARTERY BYPASS GRAFTING (CABG) x 3 UTILIZING LEFT MAMMARY ARTERY AND ENDOSCOPICALLY HARVESTED LEFT GREATER SAPHENOUS VEIN.;  Surgeon: Delight Ovens, MD;  Location: [facility] OR;  Service: Open Heart Surgery;  Laterality: N/A;  . ENDOVEIN HARVEST OF GREATER SAPHENOUS VEIN Left 09/28/2015   Procedure: ENDOVEIN HARVEST OF GREATER SAPHENOUS VEIN;  Surgeon: Delight OvensEdward B Gerhardt, MD;  [facility];  Service: Open Heart Surgery;  Laterality: Left;  . ESOPHAGOGASTRODUODENOSCOPY N/A 03/29/2014   Procedure: ESOPHAGOGASTRODUODENOSCOPY (EGD);  Surgeon: Dorena CookeyJohn Hayes, MD;  [facility];  Service: Endoscopy;  Laterality: N/A;  . EYE SURGERY    . FRACTURE SURGERY    . INSERTION OF DIALYSIS CATHETER Right  01/05/2015   Procedure: INSERTION OF RIGHT INTERNAL JUGULAR DIALYSIS CATHETER;  Surgeon: Fransisco HertzBrian L Chen, MD;  [facility];  Service: Vascular;  Laterality: Right;  . TEE WITHOUT CARDIOVERSION N/A 09/28/2015   Procedure: TRANSESOPHAGEAL ECHOCARDIOGRAM (TEE);  Surgeon: Delight OvensEdward B Gerhardt, MD;  [facility];  Service: Open Heart Surgery;  Laterality: N/A;  . TONSILLECTOMY AND ADENOIDECTOMY  1970s    Current Outpatient Prescriptions  Medication Sig Dispense Refill  . acetaminophen (TYLENOL) 500 MG tablet Take 1 tablet (500 mg total) by mouth every 6 (six) hours as needed for moderate pain. (Patient taking differently: Take 1,000 mg by mouth every 6 (six) hours as needed for moderate pain. ) 30 tablet 0  . albuterol (PROVENTIL HFA;VENTOLIN HFA) 108 (90 Base) MCG/ACT inhaler Inhale 1 puff into the lungs every 6 (six) hours as needed for wheezing or shortness of breath. 1 Inhaler 11  . albuterol-ipratropium (COMBIVENT) 18-103 MCG/ACT inhaler Inhale 2 puffs into the lungs every 4 (four) hours. (Patient taking differently: Inhale 2 puffs into the lungs every 6 (six) hours as needed for wheezing or shortness of breath. ) 1 Inhaler 11  . aspirin EC 81 MG EC tablet Take 1 tablet (81 mg total) by mouth daily.    Marland Kitchen. atorvastatin (LIPITOR) 80 MG tablet Take 1 tablet (80 mg total) by mouth daily at 6 PM. 30 tablet 3  . calcitRIOL (ROCALTROL) 0.25 MCG capsule Take 1 capsule (0.25 mcg total) by mouth every Monday, Wednesday, and Friday with hemodialysis. 30 capsule 3  . calcium acetate (PHOSLO) 667 MG capsule Take 2 capsules (1,334 mg total) by mouth 3 (three) times daily with meals. 180 capsule 0  . citalopram (CELEXA) 40 MG tablet Take 1.5 tablets (60 mg total) by mouth at bedtime. 90 tablet 3  . clonazePAM (KLONOPIN) 2 MG tablet Take 2 mg by mouth at bedtime.    . clopidogrel (PLAVIX) 75 MG tablet Take 1 tablet (75 mg total) by mouth daily. 30 tablet 3  . cyclobenzaprine (FLEXERIL) 10 MG tablet Take 1 tablet (10  mg total) by mouth at bedtime. 30 tablet 11  . gabapentin (NEURONTIN) 100 MG capsule Take 2 capsules (200 mg total) by mouth 3 (three) times daily. (Patient taking differently: Take 100-200 mg by mouth 4 (four) times daily as needed. ) 180 capsule 11  . HYDROcodone-acetaminophen (NORCO/VICODIN) 5-325 MG tablet Take 1 tablet by mouth every 4 (four) hours as needed for pain.    Marland Kitchen. HYDROmorphone (DILAUDID) 2 MG tablet Take 1 tablet (2 mg total) by mouth every 8 (eight) hours as needed for severe pain. (Patient taking differently: Take 2-4 mg by mouth every 8 (eight) hours as needed for severe pain. ) 90 tablet 0  . hydrOXYzine (ATARAX/VISTARIL) 25 MG tablet Take 25 mg by mouth 2 (two) times daily as needed for anxiety.     . insulin glargine (LANTUS) 100 UNIT/ML injection Inject 60 Units into the skin 2 (two) times daily.    .Marland Kitchen  insulin lispro (HUMALOG) 100 UNIT/ML injection Inject 15-30 Units into the skin every evening. Take one unit per 5 carbs depending on dinner.    . levothyroxine (SYNTHROID, LEVOTHROID) 50 MCG tablet Take 1 tablet (50 mcg total) by mouth daily. 90 tablet 3  . metoCLOPramide (REGLAN) 5 MG tablet Take 1 tablet (5 mg total) by mouth every 8 (eight) hours as needed for nausea. 30 tablet 11  . metoprolol tartrate (LOPRESSOR) 25 MG tablet Take 0.5 tablets (12.5 mg total) by mouth 2 (two) times daily. 30 tablet 3  . omeprazole (PRILOSEC) 40 MG capsule Take 1 capsule (40 mg total) by mouth 2 (two) times daily. 60 capsule 0  . polyethylene glycol (MIRALAX) packet Take 17 g by mouth daily. 14 each 0  . promethazine (PHENERGAN) 25 MG tablet Take 1 tablet (25 mg total) by mouth every 8 (eight) hours as needed for nausea. 90 tablet 11  . terbinafine (LAMISIL) 250 MG tablet Take 1 tablet (250 mg total) by mouth daily. 30 tablet 2   No current facility-administered medications for this visit.     Allergies:   Ibuprofen and Nsaids    Social History:  The patient  reports that he has never  smoked. He has never used smokeless tobacco. He reports that he does not drink alcohol or use drugs.   Family History:  The patient's family history includes Cancer (age of onset: 66) in his father; Kidney cancer in his maternal grandmother; Obesity in his mother.    ROS:  Please see the history of present illness. All other systems are reviewed and negative.    PHYSICAL EXAM: VS:  BP 118/86   Pulse (!) 106   Ht 6\' 2"  (1.88 m)   Wt 264 lb 6.4 oz (119.9 kg)   BMI 33.95 kg/m  , BMI Body mass index is 33.95 kg/m. GEN: Well nourished, well developed, male in no acute distress  HEENT: normal for age  Neck: no JVD, no carotid bruit, no masses Cardiac: RRR; Soft murmur, no rubs, or gallops Respiratory:  clear to auscultation bilaterally, normal work of breathing GI: soft, nontender, nondistended, + BS MS: no deformity or atrophy; no edema; distal pulses are 2+ in all 3/4 extremities; he has dialysis access in his left upper extremity and there is a palpable thrill there. Skin: warm and dry, no rash; all incisions are healing well, without signs of infection Neuro:  Strength and sensation are intact Psych: euthymic mood, full affect   EKG:  EKG is not ordered today.  ECHO: 09/21/2015 - Left ventricle: The cavity size was normal. Septal wall thickness   was increased in a pattern of moderate LVH with severe   hypertrophy of the posterior wall. Systolic function was normal.   The estimated ejection fraction was in the range of 60% to 65%.   Wall motion was normal; there were no regional wall motion   abnormalities. Doppler parameters are consistent with abnormal   left ventricular relaxation (grade 1 diastolic dysfunction).   Doppler parameters are consistent with indeterminate ventricular   filling pressure. - Aortic valve: Transvalvular velocity was within the normal range.   There was no stenosis. There was no regurgitation. - Mitral valve: Transvalvular velocity was within the  normal range.   There was no evidence for stenosis. There was trivial   regurgitation. - Right ventricle: The cavity size was normal. Wall thickness was   normal. Systolic function was normal. - Tricuspid valve: There was no regurgitation. - Pulmonic  valve: There was no regurgitation.  Recent Labs: 07/20/2015: TSH 1.243 09/29/2015: Magnesium 1.9 09/30/2015: ALT 22 10/05/2015: BUN 44; Creatinine, Ser 6.00; Hemoglobin 7.9; Platelets 319; Potassium 3.9; Sodium 135    Lipid Panel    Component Value Date/Time   CHOL 203 (H) 09/21/2015 0245   TRIG 294 (H) 09/21/2015 0245   HDL 33 (L) 09/21/2015 0245   CHOLHDL 6.2 09/21/2015 0245   VLDL 59 (H) 09/21/2015 0245   LDLCALC 111 (H) 09/21/2015 0245     Wt Readings from Last 3 Encounters:  10/19/15 264 lb 6.4 oz (119.9 kg)  10/05/15 253 lb 12 oz (115.1 kg)  07/21/15 261 lb 4.8 oz (118.5 kg)     Other studies Reviewed: Additional studies/ records that were reviewed today include: Hospital records and testing.  ASSESSMENT AND PLAN:  1.  CAD/non-STEMI: He required bypass surgery. He is recovering well from this. He is encouraged to continue the heart healthy lifestyle changes. He is on good medical therapy with aspirin, Plavix, high-dose statin, and beta blocker. I would prefer to increase the beta blocker, but his baseline blood pressure is not high enough for this. Continue current therapy, his lipids are followed by his endocrinologist.  2. Hyperlipidemia: He feels that he is making healthy dietary changes since the surgery. He is encouraged to continue these.  3. Diabetes: His insulin requirements have significantly decreased since the surgery. He definitely feels like he is being stricter with a diabetic diet. He is encouraged to continue this and follow up with endocrinology.  4. HTN: His blood pressures under good control. He needs additional blood pressure control, would increase the beta blocker.   Current medicines are reviewed at  length with the patient today.  The patient does not have concerns regarding medicines.  The following changes have been made:  no change  Labs/ tests ordered today include:  No orders of the defined types were placed in this encounter.    Disposition:   FU with Dr. Jens Som  Signed, Theodore Demark, PA-C  10/19/2015 1:36 PM    [facility] Phone: [phone]; Fax: [phone]  This note was written with the assistance of speech recognition software. Please excuse any transcriptional errors.

## 2015-10-20 DIAGNOSIS — E211 Secondary hyperparathyroidism, not elsewhere classified: Secondary | ICD-10-CM | POA: Diagnosis not present

## 2015-10-20 DIAGNOSIS — N186 End stage renal disease: Secondary | ICD-10-CM | POA: Diagnosis not present

## 2015-10-20 DIAGNOSIS — D631 Anemia in chronic kidney disease: Secondary | ICD-10-CM | POA: Diagnosis not present

## 2015-10-20 DIAGNOSIS — E1121 Type 2 diabetes mellitus with diabetic nephropathy: Secondary | ICD-10-CM | POA: Diagnosis not present

## 2015-10-21 ENCOUNTER — Telehealth: Payer: Self-pay | Admitting: Cardiology

## 2015-10-21 NOTE — Telephone Encounter (Signed)
Change metoprolol to 25 bid and follow bp Russell MillersBrian [name]

## 2015-10-21 NOTE — Telephone Encounter (Signed)
New message      Pt c/o BP issue: STAT if pt c/o blurred vision, one-sided weakness or slurred speech  1. What are your last 5 BP readings? 178/101 HR 109  2. Are you having any other symptoms (ex. Dizziness, headache, blurred vision, passed out)? no  3. What is your BP issue? bp is high----please advise

## 2015-10-21 NOTE — Telephone Encounter (Signed)
Spoke with pt, he has noticed his bp elevated for the last couple days. He noticed today his heart racing and he checked his bp and it was elevated. He feels fine other than he can hear his pulse in his ears. He has not checked his bp or pulse since taking his medications. Advised patient to take another 1/2 of the 25 mg metoprolol now. Will forward for dr Jens Somcrenshaw review and call him back. Pt agreed with this plan.

## 2015-10-22 DIAGNOSIS — I25110 Atherosclerotic heart disease of native coronary artery with unstable angina pectoris: Secondary | ICD-10-CM | POA: Diagnosis not present

## 2015-10-22 DIAGNOSIS — Z48812 Encounter for surgical aftercare following surgery on the circulatory system: Secondary | ICD-10-CM | POA: Diagnosis not present

## 2015-10-22 DIAGNOSIS — N186 End stage renal disease: Secondary | ICD-10-CM | POA: Diagnosis not present

## 2015-10-22 DIAGNOSIS — D631 Anemia in chronic kidney disease: Secondary | ICD-10-CM | POA: Diagnosis not present

## 2015-10-22 DIAGNOSIS — I120 Hypertensive chronic kidney disease with stage 5 chronic kidney disease or end stage renal disease: Secondary | ICD-10-CM | POA: Diagnosis not present

## 2015-10-22 DIAGNOSIS — E211 Secondary hyperparathyroidism, not elsewhere classified: Secondary | ICD-10-CM | POA: Diagnosis not present

## 2015-10-22 DIAGNOSIS — E1165 Type 2 diabetes mellitus with hyperglycemia: Secondary | ICD-10-CM | POA: Diagnosis not present

## 2015-10-22 DIAGNOSIS — E1122 Type 2 diabetes mellitus with diabetic chronic kidney disease: Secondary | ICD-10-CM | POA: Diagnosis not present

## 2015-10-22 DIAGNOSIS — E1121 Type 2 diabetes mellitus with diabetic nephropathy: Secondary | ICD-10-CM | POA: Diagnosis not present

## 2015-10-22 MED ORDER — METOPROLOL TARTRATE 25 MG PO TABS: 25.0000 mg | ORAL_TABLET | Freq: Two times a day (BID) | ORAL | 6 refills | Status: DC

## 2015-10-22 NOTE — Telephone Encounter (Signed)
Spoke with pt, his bp today prior to dialysis was 147/90 with pulse of 86. Patient voiced understanding of medication change and to track his bp and call with concerns. New script sent to the pharmacy

## 2015-10-25 ENCOUNTER — Telehealth: Payer: Self-pay | Admitting: Cardiology

## 2015-10-25 DIAGNOSIS — N186 End stage renal disease: Secondary | ICD-10-CM | POA: Diagnosis not present

## 2015-10-25 DIAGNOSIS — E1121 Type 2 diabetes mellitus with diabetic nephropathy: Secondary | ICD-10-CM | POA: Diagnosis not present

## 2015-10-25 DIAGNOSIS — E211 Secondary hyperparathyroidism, not elsewhere classified: Secondary | ICD-10-CM | POA: Diagnosis not present

## 2015-10-25 DIAGNOSIS — D631 Anemia in chronic kidney disease: Secondary | ICD-10-CM | POA: Diagnosis not present

## 2015-10-25 NOTE — Telephone Encounter (Signed)
Change metoprolol to 50 mg bid Olga MillersBrian Yarelis Ambrosino

## 2015-10-25 NOTE — Telephone Encounter (Signed)
New message  Lillia AbedLindsay from [facility] call requesting to speak with RN about pt bp increasing. She stated pt had a headache today and would like to speak with RN to discuss. Please call back to advise.

## 2015-10-25 NOTE — Telephone Encounter (Signed)
Returned call to AmadoLindsay with [facility].She was calling to report patient's B/P still elevated after increasing metoprolol to 25 mg twice a day.Pt only takes when he does not go to dialysis.B/P yesterday 10/22 170/100 pulse 110.Today B/P 142/94 pulse 110.Stated pt complains of a headache.Advised I will [facility] for advice.

## 2015-10-26 ENCOUNTER — Telehealth: Payer: Self-pay | Admitting: Cardiology

## 2015-10-26 DIAGNOSIS — N186 End stage renal disease: Secondary | ICD-10-CM | POA: Diagnosis not present

## 2015-10-26 DIAGNOSIS — Z48812 Encounter for surgical aftercare following surgery on the circulatory system: Secondary | ICD-10-CM | POA: Diagnosis not present

## 2015-10-26 DIAGNOSIS — I25110 Atherosclerotic heart disease of native coronary artery with unstable angina pectoris: Secondary | ICD-10-CM | POA: Diagnosis not present

## 2015-10-26 DIAGNOSIS — I120 Hypertensive chronic kidney disease with stage 5 chronic kidney disease or end stage renal disease: Secondary | ICD-10-CM | POA: Diagnosis not present

## 2015-10-26 DIAGNOSIS — E1122 Type 2 diabetes mellitus with diabetic chronic kidney disease: Secondary | ICD-10-CM | POA: Diagnosis not present

## 2015-10-26 DIAGNOSIS — E1165 Type 2 diabetes mellitus with hyperglycemia: Secondary | ICD-10-CM | POA: Diagnosis not present

## 2015-10-26 MED ORDER — METOPROLOL TARTRATE 25 MG PO TABS: ORAL_TABLET | ORAL | 6 refills | Status: DC

## 2015-10-26 NOTE — Telephone Encounter (Signed)
Ok for extra day [facility] AidBrian Laylia [name]

## 2015-10-26 NOTE — Telephone Encounter (Signed)
Okay to see patient  Extra day for blood pressure monitoring

## 2015-10-26 NOTE — Telephone Encounter (Signed)
Returned call [facility] CrookLindsay [facility] left message on personal voice mail Dr.Crensahw advised to change Metoprolol to 50 mg twice a day.Advised to continue to monitor pulse and B/P.Call back if needed. Spoke to patient advised to change metoprolol to 50 mg twice a day.New prescription sent to pharmacy.Advised to continue to monitor pulse and B/P call back if needed.

## 2015-10-26 NOTE — Telephone Encounter (Signed)
New message     [facility] - need extra day to monitor patient blood pressure .   Vital sign today 158/100 - sitting hr 95 , just increase of blood pressure medication today.   No chest pain , no sob.

## 2015-10-27 DIAGNOSIS — N186 End stage renal disease: Secondary | ICD-10-CM | POA: Diagnosis not present

## 2015-10-27 DIAGNOSIS — E211 Secondary hyperparathyroidism, not elsewhere classified: Secondary | ICD-10-CM | POA: Diagnosis not present

## 2015-10-27 DIAGNOSIS — E1121 Type 2 diabetes mellitus with diabetic nephropathy: Secondary | ICD-10-CM | POA: Diagnosis not present

## 2015-10-27 DIAGNOSIS — D631 Anemia in chronic kidney disease: Secondary | ICD-10-CM | POA: Diagnosis not present

## 2015-10-28 DIAGNOSIS — E1122 Type 2 diabetes mellitus with diabetic chronic kidney disease: Secondary | ICD-10-CM | POA: Diagnosis not present

## 2015-10-28 DIAGNOSIS — E1165 Type 2 diabetes mellitus with hyperglycemia: Secondary | ICD-10-CM | POA: Diagnosis not present

## 2015-10-28 DIAGNOSIS — I25110 Atherosclerotic heart disease of native coronary artery with unstable angina pectoris: Secondary | ICD-10-CM | POA: Diagnosis not present

## 2015-10-28 DIAGNOSIS — N186 End stage renal disease: Secondary | ICD-10-CM | POA: Diagnosis not present

## 2015-10-28 DIAGNOSIS — Z48812 Encounter for surgical aftercare following surgery on the circulatory system: Secondary | ICD-10-CM | POA: Diagnosis not present

## 2015-10-28 DIAGNOSIS — I120 Hypertensive chronic kidney disease with stage 5 chronic kidney disease or end stage renal disease: Secondary | ICD-10-CM | POA: Diagnosis not present

## 2015-10-29 DIAGNOSIS — E1122 Type 2 diabetes mellitus with diabetic chronic kidney disease: Secondary | ICD-10-CM | POA: Diagnosis not present

## 2015-10-29 DIAGNOSIS — I25110 Atherosclerotic heart disease of native coronary artery with unstable angina pectoris: Secondary | ICD-10-CM | POA: Diagnosis not present

## 2015-10-29 DIAGNOSIS — E1165 Type 2 diabetes mellitus with hyperglycemia: Secondary | ICD-10-CM | POA: Diagnosis not present

## 2015-10-29 DIAGNOSIS — N186 End stage renal disease: Secondary | ICD-10-CM | POA: Diagnosis not present

## 2015-10-29 DIAGNOSIS — E1121 Type 2 diabetes mellitus with diabetic nephropathy: Secondary | ICD-10-CM | POA: Diagnosis not present

## 2015-10-29 DIAGNOSIS — I120 Hypertensive chronic kidney disease with stage 5 chronic kidney disease or end stage renal disease: Secondary | ICD-10-CM | POA: Diagnosis not present

## 2015-10-29 DIAGNOSIS — D631 Anemia in chronic kidney disease: Secondary | ICD-10-CM | POA: Diagnosis not present

## 2015-10-29 DIAGNOSIS — Z48812 Encounter for surgical aftercare following surgery on the circulatory system: Secondary | ICD-10-CM | POA: Diagnosis not present

## 2015-10-29 DIAGNOSIS — E211 Secondary hyperparathyroidism, not elsewhere classified: Secondary | ICD-10-CM | POA: Diagnosis not present

## 2015-11-01 DIAGNOSIS — D631 Anemia in chronic kidney disease: Secondary | ICD-10-CM | POA: Diagnosis not present

## 2015-11-01 DIAGNOSIS — E211 Secondary hyperparathyroidism, not elsewhere classified: Secondary | ICD-10-CM | POA: Diagnosis not present

## 2015-11-01 DIAGNOSIS — E1121 Type 2 diabetes mellitus with diabetic nephropathy: Secondary | ICD-10-CM | POA: Diagnosis not present

## 2015-11-01 DIAGNOSIS — N186 End stage renal disease: Secondary | ICD-10-CM | POA: Diagnosis not present

## 2015-11-02 ENCOUNTER — Encounter (HOSPITAL_COMMUNITY): Payer: Self-pay | Admitting: *Deleted

## 2015-11-02 DIAGNOSIS — N049 Nephrotic syndrome with unspecified morphologic changes: Secondary | ICD-10-CM | POA: Diagnosis not present

## 2015-11-02 DIAGNOSIS — Z992 Dependence on renal dialysis: Secondary | ICD-10-CM | POA: Diagnosis not present

## 2015-11-02 DIAGNOSIS — N186 End stage renal disease: Secondary | ICD-10-CM | POA: Diagnosis not present

## 2015-11-03 ENCOUNTER — Other Ambulatory Visit: Payer: Self-pay | Admitting: Cardiothoracic Surgery

## 2015-11-03 DIAGNOSIS — E211 Secondary hyperparathyroidism, not elsewhere classified: Secondary | ICD-10-CM | POA: Diagnosis not present

## 2015-11-03 DIAGNOSIS — E1121 Type 2 diabetes mellitus with diabetic nephropathy: Secondary | ICD-10-CM | POA: Diagnosis not present

## 2015-11-03 DIAGNOSIS — E039 Hypothyroidism, unspecified: Secondary | ICD-10-CM | POA: Diagnosis not present

## 2015-11-03 DIAGNOSIS — N186 End stage renal disease: Secondary | ICD-10-CM | POA: Diagnosis not present

## 2015-11-03 DIAGNOSIS — Z951 Presence of aortocoronary bypass graft: Secondary | ICD-10-CM

## 2015-11-03 DIAGNOSIS — D631 Anemia in chronic kidney disease: Secondary | ICD-10-CM | POA: Diagnosis not present

## 2015-11-04 ENCOUNTER — Ambulatory Visit (INDEPENDENT_AMBULATORY_CARE_PROVIDER_SITE_OTHER): Payer: Self-pay | Admitting: Cardiothoracic Surgery

## 2015-11-04 ENCOUNTER — Encounter: Payer: Self-pay | Admitting: Cardiothoracic Surgery

## 2015-11-04 ENCOUNTER — Ambulatory Visit
Admission: RE | Admit: 2015-11-04 | Discharge: 2015-11-04 | Disposition: A | Discharge status: Home / Self Care | Payer: Medicare Other | Source: Ambulatory Visit | Attending: Cardiothoracic Surgery | Admitting: Cardiothoracic Surgery

## 2015-11-04 VITALS — BP 140/90 | HR 88 | Resp 20 | Ht 74.0 in | Wt 259.0 lb

## 2015-11-04 DIAGNOSIS — Z951 Presence of aortocoronary bypass graft: Secondary | ICD-10-CM

## 2015-11-04 DIAGNOSIS — Z950 Presence of cardiac pacemaker: Secondary | ICD-10-CM | POA: Diagnosis not present

## 2015-11-04 NOTE — Progress Notes (Signed)
[address]       [place] [id number]             [phone]      Russell BirkenheadSteven B [name]  Medical Record #[id number]     Date of Birth: 1970-01-24  Referring: Iran OuchArida, Muhammad A, MD Primary Care: [name], MD  Chief Complaint:   POST OP FOLLOW UP 09/28/2015  OPERATIVE REPORT PREOPERATIVE DIAGNOSIS:  Unstable angina with high-grade left anterior descending diagonal, circumflex coronary artery disease, and chronic hemodialysis. POSTOPERATIVE DIAGNOSIS:  Unstable angina with high-grade left anterior descending diagonal, circumflex coronary artery disease, and chronic hemodialysis. SURGICAL PROCEDURE:  Coronary artery bypass grafting x3 with the left internal mammary to the left anterior descending coronary artery, reverse saphenous vein graft to the diagonal coronary artery, reverse saphenous vein graft to the obtuse marginal coronary artery with left thigh endo vein harvesting of the greater saphenous vein. SURGEON:  Sheliah PlaneEdward Temitope Griffing, MD.  History of Present Illness:     Patient is a 45 year old male who underwent urgent coronary artery bypass grafting in late September. He noted feeling poorly while on dialysis in [place], had the dialysis nurses take him off dialysis and drove himself to the [facility] room where his troponins were noted to be elevated and he underwent urgent cardiac catheterization by Dr. Daphene Jaegerom Kelly. Multiple he underwent coronary artery bypass grafting and did well following. He notes now he feels much better, says he feels like a new man and specifically notes that he is tolerating dialysis much better than he did prior to surgery. He is interested in starting cardiac rehabilitation in the near future.      Past Medical History:  Diagnosis Date  . Age-related macular degeneration, wet, both eyes (HCC)    "I'm getting Alia treatments" (12/30/2014)  . Anxiety   . Arthritis    "hands" (12/30/2014)  . Asthma   . CKD  stage 3 due to type 2 diabetes mellitus (HCC)   . 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 Daily headache   . Depression   . DVT (deep venous thrombosis), H/o 01/2014-on Xarelto 03/24/2014   LLE  . Hypertension   . Hypothyroidism   . Nephrotic syndrome 05/18/2014  . NSTEMI (non-ST elevated myocardial infarction) (HCC)   . Pneumonia 11/2013  . Renal insufficiency   . Secondary DM with DKA-AG=16, BIcarb Nl 03/24/2014  . Type 2 diabetes mellitus with diabetic nephropathy (HCC)      History  Smoking Status  . Never Smoker  Smokeless Tobacco  . Never Used    History  Alcohol Use No     Allergies  Allergen Reactions  . Ibuprofen Other (See Comments)    MD told him not to take due to kidney disease.  . Nsaids Other (See Comments)    Told to avoid all nsaids due to kidney disease-takes acetaminophen as needed    Current Outpatient Prescriptions  Medication Sig Dispense Refill  . acetaminophen (TYLENOL) 500 MG tablet Take 1 tablet (500 mg total) by mouth every 6 (six) hours as needed for moderate pain. (Patient taking differently: Take 1,000 mg by mouth every 6 (six) hours as needed for moderate pain. ) 30 tablet 0  . albuterol (PROVENTIL HFA;VENTOLIN HFA) 108 (90 Base) MCG/ACT inhaler Inhale 1 puff into the lungs every 6 (six) hours  as needed for wheezing or shortness of breath. 1 Inhaler 11  . albuterol-ipratropium (COMBIVENT) 18-103 MCG/ACT inhaler Inhale 2 puffs into the lungs every 4 (four) hours. (Patient taking differently: Inhale 2 puffs into the lungs every 6 (six) hours as needed for wheezing or shortness of breath. ) 1 Inhaler 11  . aspirin EC 81 MG EC tablet Take 1 tablet (81 mg total) by mouth daily.    Marland Kitchen atorvastatin (LIPITOR) 80 MG tablet Take 1 tablet (80 mg total) by mouth daily at 6 PM. 30 tablet 3  . calcitRIOL (ROCALTROL) 0.25  MCG capsule Take 1 capsule (0.25 mcg total) by mouth every Monday, Wednesday, and Friday with hemodialysis. 30 capsule 3  . calcium acetate (PHOSLO) 667 MG capsule Take 2 capsules (1,334 mg total) by mouth 3 (three) times daily with meals. 180 capsule 0  . citalopram (CELEXA) 40 MG tablet Take 1.5 tablets (60 mg total) by mouth at bedtime. 90 tablet 3  . clonazePAM (KLONOPIN) 2 MG tablet Take 2 mg by mouth at bedtime.    . clopidogrel (PLAVIX) 75 MG tablet Take 1 tablet (75 mg total) by mouth daily. 30 tablet 3  . cyclobenzaprine (FLEXERIL) 10 MG tablet Take 1 tablet (10 mg total) by mouth at bedtime. 30 tablet 11  . gabapentin (NEURONTIN) 100 MG capsule Take 2 capsules (200 mg total) by mouth 3 (three) times daily. (Patient taking differently: Take 100-200 mg by mouth 4 (four) times daily as needed. ) 180 capsule 11  . HYDROmorphone (DILAUDID) 2 MG tablet Take 1 tablet (2 mg total) by mouth every 8 (eight) hours as needed for severe pain. (Patient taking differently: Take 2-4 mg by mouth every 8 (eight) hours as needed for severe pain. ) 90 tablet 0  . hydrOXYzine (ATARAX/VISTARIL) 25 MG tablet Take 25 mg by mouth 2 (two) times daily as needed for anxiety.     . insulin glargine (LANTUS) 100 UNIT/ML injection Inject 60 Units into the skin 2 (two) times daily.    . insulin lispro (HUMALOG) 100 UNIT/ML injection Inject 15-30 Units into the skin every evening. Take one unit per 5 carbs depending on dinner.    . levothyroxine (SYNTHROID, LEVOTHROID) 50 MCG tablet Take 1 tablet (50 mcg total) by mouth daily. 90 tablet 3  . metoCLOPramide (REGLAN) 5 MG tablet Take 1 tablet (5 mg total) by mouth every 8 (eight) hours as needed for nausea. 30 tablet 11  . metoprolol tartrate (LOPRESSOR) 25 MG tablet Take 2 tablets ( 50 mg ) twice a day 120 tablet 6  . omeprazole (PRILOSEC) 40 MG capsule Take 1 capsule (40 mg total) by mouth 2 (two) times daily. 60 capsule 0  . polyethylene glycol (MIRALAX) packet Take 17 g  by mouth daily. 14 each 0  . promethazine (PHENERGAN) 25 MG tablet Take 1 tablet (25 mg total) by mouth every 8 (eight) hours as needed for nausea. 90 tablet 11  . terbinafine (LAMISIL) 250 MG tablet Take 1 tablet (250 mg total) by mouth daily. 30 tablet 2   No current facility-administered medications for this visit.        Physical Exam: BP 140/90 (BP Location: Right Arm, Patient Position: Sitting, Cuff Size: Large)   Pulse 88   Resp 20   Ht 6\' 2"  (1.88 m)   Wt 259 lb (117.5 kg)   SpO2 93% Comment: RA  BMI 33.25 kg/m   General appearance: alert, cooperative and no distress Neurologic: intact Heart: regular rate and rhythm,  S1, S2 normal, no murmur, click, rub or gallop Lungs: clear to auscultation bilaterally Abdomen: soft, non-tender; bowel sounds normal; no masses,  no organomegaly Extremities: extremities normal, atraumatic, no cyanosis or edema, Homans sign is negative, no sign of DVT , left arm graft is patent Wound: Sternum is stable and well-healed   Diagnostic Studies & Laboratory data:     Recent Radiology Findings:   Dg Chest 2 View  Result Date: 11/04/2015 CLINICAL DATA:  45 year old male status post CABG in September. Subsequent encounter. EXAM: CHEST  2 VIEW COMPARISON:  10/05/2015 and earlier. FINDINGS: Resolved small pleural effusion and pulmonary atelectasis. Stable cardiac size and mediastinal contours. Visualized tracheal air column is within normal limits. The lungs are clear. No pneumothorax or pulmonary edema. Sequelae of CABG. No acute osseous abnormality identified. IMPRESSION: No acute cardiopulmonary abnormality. Resolved small pleural effusion and pulmonary atelectasis. Electronically Signed   By: Odessa FlemingH  Hall M.D.   On: 11/04/2015 12:04      Recent Lab Findings: Lab Results  Component Value Date   WBC 6.8 10/05/2015   HGB 7.9 (L) 10/05/2015   HCT 25.5 (L) 10/05/2015   PLT 319 10/05/2015   GLUCOSE 139 (H) 10/05/2015   CHOL 203 (H) 09/21/2015    TRIG 294 (H) 09/21/2015   HDL 33 (L) 09/21/2015   LDLCALC 111 (H) 09/21/2015   ALT 22 09/30/2015   AST 25 09/30/2015   NA 135 10/05/2015   K 3.9 10/05/2015   CL 97 (L) 10/05/2015   CREATININE 6.00 (H) 10/05/2015   BUN 44 (H) 10/05/2015   CO2 30 10/05/2015   TSH 1.243 07/20/2015   INR 1.33 09/28/2015   HGBA1C 12.5 (H) 09/28/2015      Assessment / Plan:      Patient is making excellent progress following urgent coronary artery bypass grafting in this 45 year old patient on chronic dialysis who presented with acute MI. He has no signs or symptoms of recurrent heart failure or angina. He will start in the cardiac rehabilitation program and neck 1-2 weeks He notes that he prefers to [facility] He's closely followed by cardiology and nephrology I've not made a return appointment for him to see me but would be glad to see him at any time as necessary.  Delight OvensEdward B Myrikal Messmer MD      [address] [place] [id number] Office [phone]   Beeper [phone]  11/04/2015 12:29 PM

## 2015-11-04 NOTE — Patient Instructions (Signed)
    [address]       [place] [address]             [phone]       Coronary Artery Bypass Grafting  Care After  Refer to this sheet in the next few weeks. These instructions provide you with information on caring for yourself after your procedure. Your caregiver may also give you more specific instructions. Your treatment has been planned according to current medical practices, but problems sometimes occur. Call your caregiver if you have any problems or questions after your procedure.  Recovery from open heart surgery will be different for everyone. Some people feel well after 3 or 4 weeks, while for others it takes longer. After heart surgery, it may be normal to:  Not have an appetite, feel nauseated by the smell of food, or only want to eat a small amount.   Be constipated because of changes in your diet, activity, and medicines. Eat foods high in fiber. Add fresh fruits and vegetables to your diet. Stool softeners may be helpful.   Feel sad or unhappy. You may be frustrated or cranky. You may have good days and bad days. Do not give up. Talk to your caregiver if you do not feel better.   Feel weakness and fatigue. You many need physical therapy or cardiac rehabilitation to get your strength back.   Develop an irregular heartbeat called atrial fibrillation. Symptoms of atrial fibrillation are a fast, irregular heartbeat or feelings of fluttery heartbeats, shortness of breath, low blood pressure, and dizziness. If these symptoms develop, see your caregiver right away.  MEDICATION  Have a list of all the medicines you will be taking when you leave the hospital. For every medicine, know the following:   Name.   Exact dose.   Time of day to be taken.   How often it should be taken.   Why you are taking it.   Ask which medicines should or should not be taken together. If you take more than one heart medicine, ask if it is okay to take them together. Some  heart medicines should not be taken at the same time because they may lower your blood pressure too much.   Narcotic pain medicine can cause constipation. Eat fresh fruits and vegetables. Add fiber to your diet. Stool softener medicine may help relieve constipation.   Keep a copy of your medicines with you at all times.   Do not add or stop taking any medicine until you check with your caregiver.   Medicines can have side effects. Call your caregiver who prescribed the medicine if you:   Start throwing up, have diarrhea, or have stomach pain.   Feel dizzy or lightheaded when you stand up.   Feel your heart is skipping beats or is beating too fast or too slow.   Develop a rash.   Notice unusual bruising or bleeding.  HOME CARE INSTRUCTIONS  After heart surgery, it is important to learn how to take your pulse. Have your caregiver show you how to take your pulse.   Use your incentive spirometer. Ask your caregiver how long after surgery you need to use it.  Care of your chest incision  Tell your caregiver right away if you notice clicking in your chest (sternum).   Support your chest with a pillow or your arms when you take deep breaths and cough.   Follow your caregiver's instructions about when you can bathe or   swim.   Protect your incision from sunlight during the first year to keep the scar from getting dark.   Tell your caregiver if you notice:   Increased tenderness of your incision.   Increased redness or swelling around your incision.   Drainage or pus from your incision.  Care of your leg incision(s)  Avoid crossing your legs.   Avoid sitting for long periods of time. Change positions every half hour.   Elevate your leg(s) when you are sitting.   Check your leg(s) daily for swelling. Check the incisions for redness or drainage.   Diet is very important to heart health.   Eat plenty of fresh fruits and vegetables. Meats should be lean cut. Avoid canned,  processed, and fried foods.   Talk to a dietician. They can teach you how to make healthy food and drink choices.  Weight  Weigh yourself every day. This is important because it helps to know if you are retaining fluid that may make your heart and lungs work harder.   Use the same scale each time.   Weigh yourself every morning at the same time. You should do this after you go to the bathroom, but before you eat breakfast.   Your weight will be more accurate if you do not wear any clothes.   Record your weight.   Tell your caregiver if you have gained 2 pounds or more overnight.  Activity Stop any activity at once if you have chest pain, shortness of breath, irregular heartbeats, or dizziness. Get help right away if you have any of these symptoms.  Bathing.  Avoid soaking in a bath or hot tub until your incisions are healed.   Rest. You need a balance of rest and activity.   Exercise. Exercise per your caregiver's advice. You may need physical therapy or cardiac rehabilitation to help strengthen your muscles and build your endurance.   Climbing stairs. Unless your caregiver tells you not to climb stairs, go up stairs slowly and rest if you tire. Do not pull yourself up by the handrail.   Driving a car. Follow your caregiver's advice on when you may drive. You may ride as a passenger at any time. When traveling for long periods of time in a car, get out of the car and walk around for a few minutes every 2 hours.   Lifting. Avoid lifting, pushing, or pulling anything heavier than 10 pounds for 6 weeks after surgery or as told by your caregiver.   Returning to work. Check with your caregiver. People heal at different rates. Most people will be able to go back to work 6 to 12 weeks after surgery.   Sexual activity. You may resume sexual relations as told by your caregiver.  SEEK MEDICAL CARE IF:  Any of your incisions are red, painful, or have any type of drainage coming from them.     You have an oral temperature above 101.5 F .   You have ankle or leg swelling.   You have pain in your legs.   You have weight gain of 2 or more pounds a day.   You feel dizzy or lightheaded when you stand up.  SEEK IMMEDIATE MEDICAL CARE IF:  You have angina or chest pain that goes to your jaw or arms. Call your local emergency services right away.   You have shortness of breath at rest or with activity.   You have a fast or irregular heartbeat (arrhythmia).   There is   a "clicking" in your sternum when you move.   You have numbness or weakness in your arms or legs.  MAKE SURE YOU:  Understand these instructions.   Will watch your condition.   Will get help right away if you are not doing well or get worse.    No lifting over 25 lbs for 3 months 

## 2015-11-05 DIAGNOSIS — D631 Anemia in chronic kidney disease: Secondary | ICD-10-CM | POA: Diagnosis not present

## 2015-11-05 DIAGNOSIS — N186 End stage renal disease: Secondary | ICD-10-CM | POA: Diagnosis not present

## 2015-11-05 DIAGNOSIS — E039 Hypothyroidism, unspecified: Secondary | ICD-10-CM | POA: Diagnosis not present

## 2015-11-05 DIAGNOSIS — E1121 Type 2 diabetes mellitus with diabetic nephropathy: Secondary | ICD-10-CM | POA: Diagnosis not present

## 2015-11-05 DIAGNOSIS — E211 Secondary hyperparathyroidism, not elsewhere classified: Secondary | ICD-10-CM | POA: Diagnosis not present

## 2015-11-08 DIAGNOSIS — E211 Secondary hyperparathyroidism, not elsewhere classified: Secondary | ICD-10-CM | POA: Diagnosis not present

## 2015-11-08 DIAGNOSIS — D631 Anemia in chronic kidney disease: Secondary | ICD-10-CM | POA: Diagnosis not present

## 2015-11-08 DIAGNOSIS — E039 Hypothyroidism, unspecified: Secondary | ICD-10-CM | POA: Diagnosis not present

## 2015-11-08 DIAGNOSIS — N186 End stage renal disease: Secondary | ICD-10-CM | POA: Diagnosis not present

## 2015-11-08 DIAGNOSIS — E1121 Type 2 diabetes mellitus with diabetic nephropathy: Secondary | ICD-10-CM | POA: Diagnosis not present

## 2015-11-10 DIAGNOSIS — N186 End stage renal disease: Secondary | ICD-10-CM | POA: Diagnosis not present

## 2015-11-10 DIAGNOSIS — E1121 Type 2 diabetes mellitus with diabetic nephropathy: Secondary | ICD-10-CM | POA: Diagnosis not present

## 2015-11-10 DIAGNOSIS — E211 Secondary hyperparathyroidism, not elsewhere classified: Secondary | ICD-10-CM | POA: Diagnosis not present

## 2015-11-10 DIAGNOSIS — D631 Anemia in chronic kidney disease: Secondary | ICD-10-CM | POA: Diagnosis not present

## 2015-11-10 DIAGNOSIS — E039 Hypothyroidism, unspecified: Secondary | ICD-10-CM | POA: Diagnosis not present

## 2015-11-12 DIAGNOSIS — D631 Anemia in chronic kidney disease: Secondary | ICD-10-CM | POA: Diagnosis not present

## 2015-11-12 DIAGNOSIS — E039 Hypothyroidism, unspecified: Secondary | ICD-10-CM | POA: Diagnosis not present

## 2015-11-12 DIAGNOSIS — N186 End stage renal disease: Secondary | ICD-10-CM | POA: Diagnosis not present

## 2015-11-12 DIAGNOSIS — E211 Secondary hyperparathyroidism, not elsewhere classified: Secondary | ICD-10-CM | POA: Diagnosis not present

## 2015-11-12 DIAGNOSIS — E1121 Type 2 diabetes mellitus with diabetic nephropathy: Secondary | ICD-10-CM | POA: Diagnosis not present

## 2015-11-15 DIAGNOSIS — E211 Secondary hyperparathyroidism, not elsewhere classified: Secondary | ICD-10-CM | POA: Diagnosis not present

## 2015-11-15 DIAGNOSIS — E039 Hypothyroidism, unspecified: Secondary | ICD-10-CM | POA: Diagnosis not present

## 2015-11-15 DIAGNOSIS — E1121 Type 2 diabetes mellitus with diabetic nephropathy: Secondary | ICD-10-CM | POA: Diagnosis not present

## 2015-11-15 DIAGNOSIS — D631 Anemia in chronic kidney disease: Secondary | ICD-10-CM | POA: Diagnosis not present

## 2015-11-15 DIAGNOSIS — N186 End stage renal disease: Secondary | ICD-10-CM | POA: Diagnosis not present

## 2015-11-16 ENCOUNTER — Ambulatory Visit: Payer: Medicare Other | Admitting: Internal Medicine

## 2015-11-16 DIAGNOSIS — Z0289 Encounter for other administrative examinations: Secondary | ICD-10-CM

## 2015-11-17 DIAGNOSIS — E1121 Type 2 diabetes mellitus with diabetic nephropathy: Secondary | ICD-10-CM | POA: Diagnosis not present

## 2015-11-17 DIAGNOSIS — E039 Hypothyroidism, unspecified: Secondary | ICD-10-CM | POA: Diagnosis not present

## 2015-11-17 DIAGNOSIS — D631 Anemia in chronic kidney disease: Secondary | ICD-10-CM | POA: Diagnosis not present

## 2015-11-17 DIAGNOSIS — E211 Secondary hyperparathyroidism, not elsewhere classified: Secondary | ICD-10-CM | POA: Diagnosis not present

## 2015-11-17 DIAGNOSIS — N186 End stage renal disease: Secondary | ICD-10-CM | POA: Diagnosis not present

## 2015-11-19 DIAGNOSIS — E039 Hypothyroidism, unspecified: Secondary | ICD-10-CM | POA: Diagnosis not present

## 2015-11-19 DIAGNOSIS — D631 Anemia in chronic kidney disease: Secondary | ICD-10-CM | POA: Diagnosis not present

## 2015-11-19 DIAGNOSIS — N186 End stage renal disease: Secondary | ICD-10-CM | POA: Diagnosis not present

## 2015-11-19 DIAGNOSIS — E1121 Type 2 diabetes mellitus with diabetic nephropathy: Secondary | ICD-10-CM | POA: Diagnosis not present

## 2015-11-19 DIAGNOSIS — E211 Secondary hyperparathyroidism, not elsewhere classified: Secondary | ICD-10-CM | POA: Diagnosis not present

## 2015-11-23 DIAGNOSIS — E1121 Type 2 diabetes mellitus with diabetic nephropathy: Secondary | ICD-10-CM | POA: Diagnosis not present

## 2015-11-23 DIAGNOSIS — E211 Secondary hyperparathyroidism, not elsewhere classified: Secondary | ICD-10-CM | POA: Diagnosis not present

## 2015-11-23 DIAGNOSIS — E039 Hypothyroidism, unspecified: Secondary | ICD-10-CM | POA: Diagnosis not present

## 2015-11-23 DIAGNOSIS — D631 Anemia in chronic kidney disease: Secondary | ICD-10-CM | POA: Diagnosis not present

## 2015-11-23 DIAGNOSIS — N186 End stage renal disease: Secondary | ICD-10-CM | POA: Diagnosis not present

## 2015-11-26 DIAGNOSIS — E039 Hypothyroidism, unspecified: Secondary | ICD-10-CM | POA: Diagnosis not present

## 2015-11-26 DIAGNOSIS — E211 Secondary hyperparathyroidism, not elsewhere classified: Secondary | ICD-10-CM | POA: Diagnosis not present

## 2015-11-26 DIAGNOSIS — E1121 Type 2 diabetes mellitus with diabetic nephropathy: Secondary | ICD-10-CM | POA: Diagnosis not present

## 2015-11-26 DIAGNOSIS — D631 Anemia in chronic kidney disease: Secondary | ICD-10-CM | POA: Diagnosis not present

## 2015-11-26 DIAGNOSIS — N186 End stage renal disease: Secondary | ICD-10-CM | POA: Diagnosis not present

## 2015-11-27 ENCOUNTER — Other Ambulatory Visit: Payer: Self-pay | Admitting: Internal Medicine

## 2015-11-29 ENCOUNTER — Other Ambulatory Visit: Payer: Self-pay

## 2015-11-29 ENCOUNTER — Telehealth: Payer: Self-pay | Admitting: Internal Medicine

## 2015-11-29 DIAGNOSIS — E1121 Type 2 diabetes mellitus with diabetic nephropathy: Secondary | ICD-10-CM | POA: Diagnosis not present

## 2015-11-29 DIAGNOSIS — E1165 Type 2 diabetes mellitus with hyperglycemia: Principal | ICD-10-CM

## 2015-11-29 DIAGNOSIS — E211 Secondary hyperparathyroidism, not elsewhere classified: Secondary | ICD-10-CM | POA: Diagnosis not present

## 2015-11-29 DIAGNOSIS — E1151 Type 2 diabetes mellitus with diabetic peripheral angiopathy without gangrene: Secondary | ICD-10-CM

## 2015-11-29 DIAGNOSIS — E039 Hypothyroidism, unspecified: Secondary | ICD-10-CM | POA: Diagnosis not present

## 2015-11-29 DIAGNOSIS — N186 End stage renal disease: Secondary | ICD-10-CM | POA: Diagnosis not present

## 2015-11-29 DIAGNOSIS — IMO0002 Reserved for concepts with insufficient information to code with codable children: Secondary | ICD-10-CM

## 2015-11-29 DIAGNOSIS — D631 Anemia in chronic kidney disease: Secondary | ICD-10-CM | POA: Diagnosis not present

## 2015-11-29 MED ORDER — INSULIN LISPRO 100 UNIT/ML ~~LOC~~ SOLN: 15.0000 [IU] | Freq: Every evening | SUBCUTANEOUS | 2 refills | Status: DC

## 2015-11-29 NOTE — Telephone Encounter (Signed)
Please call in the humalog with a diagnosis code please e-rx

## 2015-11-29 NOTE — Telephone Encounter (Signed)
SENT 

## 2015-12-01 DIAGNOSIS — E211 Secondary hyperparathyroidism, not elsewhere classified: Secondary | ICD-10-CM | POA: Diagnosis not present

## 2015-12-01 DIAGNOSIS — E1121 Type 2 diabetes mellitus with diabetic nephropathy: Secondary | ICD-10-CM | POA: Diagnosis not present

## 2015-12-01 DIAGNOSIS — N186 End stage renal disease: Secondary | ICD-10-CM | POA: Diagnosis not present

## 2015-12-01 DIAGNOSIS — D631 Anemia in chronic kidney disease: Secondary | ICD-10-CM | POA: Diagnosis not present

## 2015-12-01 DIAGNOSIS — E039 Hypothyroidism, unspecified: Secondary | ICD-10-CM | POA: Diagnosis not present

## 2015-12-02 DIAGNOSIS — N049 Nephrotic syndrome with unspecified morphologic changes: Secondary | ICD-10-CM | POA: Diagnosis not present

## 2015-12-02 DIAGNOSIS — N186 End stage renal disease: Secondary | ICD-10-CM | POA: Diagnosis not present

## 2015-12-02 DIAGNOSIS — Z992 Dependence on renal dialysis: Secondary | ICD-10-CM | POA: Diagnosis not present

## 2015-12-03 DIAGNOSIS — E211 Secondary hyperparathyroidism, not elsewhere classified: Secondary | ICD-10-CM | POA: Diagnosis not present

## 2015-12-03 DIAGNOSIS — D631 Anemia in chronic kidney disease: Secondary | ICD-10-CM | POA: Diagnosis not present

## 2015-12-03 DIAGNOSIS — E1121 Type 2 diabetes mellitus with diabetic nephropathy: Secondary | ICD-10-CM | POA: Diagnosis not present

## 2015-12-03 DIAGNOSIS — N186 End stage renal disease: Secondary | ICD-10-CM | POA: Diagnosis not present

## 2015-12-06 DIAGNOSIS — N186 End stage renal disease: Secondary | ICD-10-CM | POA: Diagnosis not present

## 2015-12-06 DIAGNOSIS — E211 Secondary hyperparathyroidism, not elsewhere classified: Secondary | ICD-10-CM | POA: Diagnosis not present

## 2015-12-06 DIAGNOSIS — E1121 Type 2 diabetes mellitus with diabetic nephropathy: Secondary | ICD-10-CM | POA: Diagnosis not present

## 2015-12-06 DIAGNOSIS — D631 Anemia in chronic kidney disease: Secondary | ICD-10-CM | POA: Diagnosis not present

## 2015-12-08 DIAGNOSIS — E1121 Type 2 diabetes mellitus with diabetic nephropathy: Secondary | ICD-10-CM | POA: Diagnosis not present

## 2015-12-08 DIAGNOSIS — N186 End stage renal disease: Secondary | ICD-10-CM | POA: Diagnosis not present

## 2015-12-08 DIAGNOSIS — D631 Anemia in chronic kidney disease: Secondary | ICD-10-CM | POA: Diagnosis not present

## 2015-12-08 DIAGNOSIS — E211 Secondary hyperparathyroidism, not elsewhere classified: Secondary | ICD-10-CM | POA: Diagnosis not present

## 2015-12-10 DIAGNOSIS — E211 Secondary hyperparathyroidism, not elsewhere classified: Secondary | ICD-10-CM | POA: Diagnosis not present

## 2015-12-10 DIAGNOSIS — D631 Anemia in chronic kidney disease: Secondary | ICD-10-CM | POA: Diagnosis not present

## 2015-12-10 DIAGNOSIS — E1121 Type 2 diabetes mellitus with diabetic nephropathy: Secondary | ICD-10-CM | POA: Diagnosis not present

## 2015-12-10 DIAGNOSIS — N186 End stage renal disease: Secondary | ICD-10-CM | POA: Diagnosis not present

## 2015-12-13 DIAGNOSIS — D631 Anemia in chronic kidney disease: Secondary | ICD-10-CM | POA: Diagnosis not present

## 2015-12-13 DIAGNOSIS — E1121 Type 2 diabetes mellitus with diabetic nephropathy: Secondary | ICD-10-CM | POA: Diagnosis not present

## 2015-12-13 DIAGNOSIS — E211 Secondary hyperparathyroidism, not elsewhere classified: Secondary | ICD-10-CM | POA: Diagnosis not present

## 2015-12-13 DIAGNOSIS — N186 End stage renal disease: Secondary | ICD-10-CM | POA: Diagnosis not present

## 2015-12-15 DIAGNOSIS — N186 End stage renal disease: Secondary | ICD-10-CM | POA: Diagnosis not present

## 2015-12-15 DIAGNOSIS — E211 Secondary hyperparathyroidism, not elsewhere classified: Secondary | ICD-10-CM | POA: Diagnosis not present

## 2015-12-15 DIAGNOSIS — D631 Anemia in chronic kidney disease: Secondary | ICD-10-CM | POA: Diagnosis not present

## 2015-12-15 DIAGNOSIS — E1121 Type 2 diabetes mellitus with diabetic nephropathy: Secondary | ICD-10-CM | POA: Diagnosis not present

## 2015-12-17 DIAGNOSIS — E211 Secondary hyperparathyroidism, not elsewhere classified: Secondary | ICD-10-CM | POA: Diagnosis not present

## 2015-12-17 DIAGNOSIS — D631 Anemia in chronic kidney disease: Secondary | ICD-10-CM | POA: Diagnosis not present

## 2015-12-17 DIAGNOSIS — N186 End stage renal disease: Secondary | ICD-10-CM | POA: Diagnosis not present

## 2015-12-17 DIAGNOSIS — E1121 Type 2 diabetes mellitus with diabetic nephropathy: Secondary | ICD-10-CM | POA: Diagnosis not present

## 2015-12-20 DIAGNOSIS — E1121 Type 2 diabetes mellitus with diabetic nephropathy: Secondary | ICD-10-CM | POA: Diagnosis not present

## 2015-12-20 DIAGNOSIS — D631 Anemia in chronic kidney disease: Secondary | ICD-10-CM | POA: Diagnosis not present

## 2015-12-20 DIAGNOSIS — E211 Secondary hyperparathyroidism, not elsewhere classified: Secondary | ICD-10-CM | POA: Diagnosis not present

## 2015-12-20 DIAGNOSIS — N186 End stage renal disease: Secondary | ICD-10-CM | POA: Diagnosis not present

## 2015-12-22 DIAGNOSIS — E1121 Type 2 diabetes mellitus with diabetic nephropathy: Secondary | ICD-10-CM | POA: Diagnosis not present

## 2015-12-22 DIAGNOSIS — E211 Secondary hyperparathyroidism, not elsewhere classified: Secondary | ICD-10-CM | POA: Diagnosis not present

## 2015-12-22 DIAGNOSIS — N186 End stage renal disease: Secondary | ICD-10-CM | POA: Diagnosis not present

## 2015-12-22 DIAGNOSIS — D631 Anemia in chronic kidney disease: Secondary | ICD-10-CM | POA: Diagnosis not present

## 2015-12-24 DIAGNOSIS — D631 Anemia in chronic kidney disease: Secondary | ICD-10-CM | POA: Diagnosis not present

## 2015-12-24 DIAGNOSIS — E211 Secondary hyperparathyroidism, not elsewhere classified: Secondary | ICD-10-CM | POA: Diagnosis not present

## 2015-12-24 DIAGNOSIS — N186 End stage renal disease: Secondary | ICD-10-CM | POA: Diagnosis not present

## 2015-12-24 DIAGNOSIS — E1121 Type 2 diabetes mellitus with diabetic nephropathy: Secondary | ICD-10-CM | POA: Diagnosis not present

## 2015-12-26 DIAGNOSIS — N186 End stage renal disease: Secondary | ICD-10-CM | POA: Diagnosis not present

## 2015-12-26 DIAGNOSIS — D631 Anemia in chronic kidney disease: Secondary | ICD-10-CM | POA: Diagnosis not present

## 2015-12-26 DIAGNOSIS — E211 Secondary hyperparathyroidism, not elsewhere classified: Secondary | ICD-10-CM | POA: Diagnosis not present

## 2015-12-26 DIAGNOSIS — E1121 Type 2 diabetes mellitus with diabetic nephropathy: Secondary | ICD-10-CM | POA: Diagnosis not present

## 2015-12-29 DIAGNOSIS — N186 End stage renal disease: Secondary | ICD-10-CM | POA: Diagnosis not present

## 2015-12-29 DIAGNOSIS — E1121 Type 2 diabetes mellitus with diabetic nephropathy: Secondary | ICD-10-CM | POA: Diagnosis not present

## 2015-12-29 DIAGNOSIS — D631 Anemia in chronic kidney disease: Secondary | ICD-10-CM | POA: Diagnosis not present

## 2015-12-29 DIAGNOSIS — E211 Secondary hyperparathyroidism, not elsewhere classified: Secondary | ICD-10-CM | POA: Diagnosis not present

## 2015-12-30 DIAGNOSIS — F411 Generalized anxiety disorder: Secondary | ICD-10-CM | POA: Diagnosis not present

## 2015-12-30 DIAGNOSIS — F321 Major depressive disorder, single episode, moderate: Secondary | ICD-10-CM | POA: Diagnosis not present

## 2015-12-31 DIAGNOSIS — D631 Anemia in chronic kidney disease: Secondary | ICD-10-CM | POA: Diagnosis not present

## 2015-12-31 DIAGNOSIS — N186 End stage renal disease: Secondary | ICD-10-CM | POA: Diagnosis not present

## 2015-12-31 DIAGNOSIS — E1121 Type 2 diabetes mellitus with diabetic nephropathy: Secondary | ICD-10-CM | POA: Diagnosis not present

## 2015-12-31 DIAGNOSIS — E211 Secondary hyperparathyroidism, not elsewhere classified: Secondary | ICD-10-CM | POA: Diagnosis not present

## 2016-01-02 DIAGNOSIS — D631 Anemia in chronic kidney disease: Secondary | ICD-10-CM | POA: Diagnosis not present

## 2016-01-02 DIAGNOSIS — E211 Secondary hyperparathyroidism, not elsewhere classified: Secondary | ICD-10-CM | POA: Diagnosis not present

## 2016-01-02 DIAGNOSIS — N186 End stage renal disease: Secondary | ICD-10-CM | POA: Diagnosis not present

## 2016-01-02 DIAGNOSIS — Z992 Dependence on renal dialysis: Secondary | ICD-10-CM | POA: Diagnosis not present

## 2016-01-02 DIAGNOSIS — N049 Nephrotic syndrome with unspecified morphologic changes: Secondary | ICD-10-CM | POA: Diagnosis not present

## 2016-01-02 DIAGNOSIS — E1121 Type 2 diabetes mellitus with diabetic nephropathy: Secondary | ICD-10-CM | POA: Diagnosis not present

## 2016-01-05 DIAGNOSIS — D631 Anemia in chronic kidney disease: Secondary | ICD-10-CM | POA: Diagnosis not present

## 2016-01-05 DIAGNOSIS — E211 Secondary hyperparathyroidism, not elsewhere classified: Secondary | ICD-10-CM | POA: Diagnosis not present

## 2016-01-05 DIAGNOSIS — E1121 Type 2 diabetes mellitus with diabetic nephropathy: Secondary | ICD-10-CM | POA: Diagnosis not present

## 2016-01-05 DIAGNOSIS — N186 End stage renal disease: Secondary | ICD-10-CM | POA: Diagnosis not present

## 2016-01-07 ENCOUNTER — Encounter (HOSPITAL_COMMUNITY): Payer: Self-pay | Admitting: *Deleted

## 2016-01-07 ENCOUNTER — Emergency Department (HOSPITAL_COMMUNITY)
Admission: EM | Admit: 2016-01-07 | Discharge: 2016-01-08 | Disposition: A | Discharge status: Home / Self Care | Payer: Medicare Other | Attending: Emergency Medicine | Admitting: Emergency Medicine

## 2016-01-07 DIAGNOSIS — I120 Hypertensive chronic kidney disease with stage 5 chronic kidney disease or end stage renal disease: Secondary | ICD-10-CM | POA: Diagnosis not present

## 2016-01-07 DIAGNOSIS — I2510 Atherosclerotic heart disease of native coronary artery without angina pectoris: Secondary | ICD-10-CM | POA: Diagnosis not present

## 2016-01-07 DIAGNOSIS — Z7982 Long term (current) use of aspirin: Secondary | ICD-10-CM | POA: Insufficient documentation

## 2016-01-07 DIAGNOSIS — E1122 Type 2 diabetes mellitus with diabetic chronic kidney disease: Secondary | ICD-10-CM | POA: Insufficient documentation

## 2016-01-07 DIAGNOSIS — Z7902 Long term (current) use of antithrombotics/antiplatelets: Secondary | ICD-10-CM | POA: Insufficient documentation

## 2016-01-07 DIAGNOSIS — E1121 Type 2 diabetes mellitus with diabetic nephropathy: Secondary | ICD-10-CM | POA: Insufficient documentation

## 2016-01-07 DIAGNOSIS — E1110 Type 2 diabetes mellitus with ketoacidosis without coma: Secondary | ICD-10-CM | POA: Diagnosis not present

## 2016-01-07 DIAGNOSIS — Z794 Long term (current) use of insulin: Secondary | ICD-10-CM | POA: Insufficient documentation

## 2016-01-07 DIAGNOSIS — Z951 Presence of aortocoronary bypass graft: Secondary | ICD-10-CM | POA: Diagnosis not present

## 2016-01-07 DIAGNOSIS — R112 Nausea with vomiting, unspecified: Secondary | ICD-10-CM

## 2016-01-07 DIAGNOSIS — K3184 Gastroparesis: Secondary | ICD-10-CM

## 2016-01-07 DIAGNOSIS — I252 Old myocardial infarction: Secondary | ICD-10-CM | POA: Insufficient documentation

## 2016-01-07 DIAGNOSIS — Z79899 Other long term (current) drug therapy: Secondary | ICD-10-CM | POA: Diagnosis not present

## 2016-01-07 DIAGNOSIS — J45909 Unspecified asthma, uncomplicated: Secondary | ICD-10-CM | POA: Insufficient documentation

## 2016-01-07 DIAGNOSIS — N186 End stage renal disease: Secondary | ICD-10-CM | POA: Insufficient documentation

## 2016-01-07 DIAGNOSIS — Z992 Dependence on renal dialysis: Secondary | ICD-10-CM | POA: Insufficient documentation

## 2016-01-07 DIAGNOSIS — E211 Secondary hyperparathyroidism, not elsewhere classified: Secondary | ICD-10-CM | POA: Diagnosis not present

## 2016-01-07 DIAGNOSIS — E039 Hypothyroidism, unspecified: Secondary | ICD-10-CM | POA: Diagnosis not present

## 2016-01-07 DIAGNOSIS — E1143 Type 2 diabetes mellitus with diabetic autonomic (poly)neuropathy: Secondary | ICD-10-CM | POA: Diagnosis not present

## 2016-01-07 DIAGNOSIS — D631 Anemia in chronic kidney disease: Secondary | ICD-10-CM | POA: Diagnosis not present

## 2016-01-07 LAB — COMPREHENSIVE METABOLIC PANEL
ALBUMIN: 4.3 g/dL (ref 3.5–5.0)
ALK PHOS: 113 U/L (ref 38–126)
ALT: 23 U/L (ref 17–63)
AST: 26 U/L (ref 15–41)
Anion gap: 18 — ABNORMAL HIGH (ref 5–15)
BILIRUBIN TOTAL: 0.8 mg/dL (ref 0.3–1.2)
BUN: 30 mg/dL — AB (ref 6–20)
CO2: 27 mmol/L (ref 22–32)
CREATININE: 5.06 mg/dL — AB (ref 0.61–1.24)
Calcium: 9.3 mg/dL (ref 8.9–10.3)
Chloride: 91 mmol/L — ABNORMAL LOW (ref 101–111)
GFR calc Af Amer: 15 mL/min — ABNORMAL LOW (ref >60)
GFR, EST NON AFRICAN AMERICAN: 13 mL/min — AB (ref >60)
GLUCOSE: 353 mg/dL — AB (ref 65–99)
POTASSIUM: 4.4 mmol/L (ref 3.5–5.1)
Sodium: 136 mmol/L (ref 135–145)
TOTAL PROTEIN: 8.3 g/dL — AB (ref 6.5–8.1)

## 2016-01-07 LAB — BETA-HYDROXYBUTYRIC ACID: Beta-Hydroxybutyric Acid: 1.62 mmol/L — ABNORMAL HIGH (ref 0.05–0.27)

## 2016-01-07 LAB — CBC
HEMATOCRIT: 37.6 % — AB (ref 39.0–52.0)
Hemoglobin: 12.4 g/dL — ABNORMAL LOW (ref 13.0–17.0)
MCH: 26.3 pg (ref 26.0–34.0)
MCHC: 33 g/dL (ref 30.0–36.0)
MCV: 79.7 fL (ref 78.0–100.0)
PLATELETS: 312 10*3/uL (ref 150–400)
RBC: 4.72 MIL/uL (ref 4.22–5.81)
RDW: 16.5 % — AB (ref 11.5–15.5)
WBC: 11.9 10*3/uL — ABNORMAL HIGH (ref 4.0–10.5)

## 2016-01-07 LAB — BASIC METABOLIC PANEL
Anion gap: 19 — ABNORMAL HIGH (ref 5–15)
BUN: 39 mg/dL — AB (ref 6–20)
CALCIUM: 9.3 mg/dL (ref 8.9–10.3)
CHLORIDE: 89 mmol/L — AB (ref 101–111)
CO2: 27 mmol/L (ref 22–32)
CREATININE: 5.64 mg/dL — AB (ref 0.61–1.24)
GFR calc Af Amer: 13 mL/min — ABNORMAL LOW (ref >60)
GFR calc non Af Amer: 11 mL/min — ABNORMAL LOW (ref >60)
Glucose, Bld: 425 mg/dL — ABNORMAL HIGH (ref 65–99)
Potassium: 4.4 mmol/L (ref 3.5–5.1)
SODIUM: 135 mmol/L (ref 135–145)

## 2016-01-07 LAB — CBG MONITORING, ED: GLUCOSE-CAPILLARY: 303 mg/dL — AB (ref 65–99)

## 2016-01-07 LAB — LIPASE, BLOOD: Lipase: 28 U/L (ref 11–51)

## 2016-01-07 MED ORDER — PROMETHAZINE HCL 25 MG/ML IJ SOLN
12.5000 mg | Freq: Once | INTRAMUSCULAR | Status: AC
  Administered 2016-01-07: 12.5 mg via INTRAVENOUS
  Filled 2016-01-07: qty 1

## 2016-01-07 MED ORDER — ONDANSETRON 4 MG PO TBDP: 4.0000 mg | ORAL_TABLET | Freq: Once | ORAL | Status: DC | PRN

## 2016-01-07 MED ORDER — INSULIN ASPART 100 UNIT/ML ~~LOC~~ SOLN
5.0000 [IU] | Freq: Once | SUBCUTANEOUS | Status: AC
  Administered 2016-01-07: 5 [IU] via INTRAVENOUS
  Filled 2016-01-07: qty 1

## 2016-01-07 MED ORDER — ONDANSETRON 4 MG PO TBDP
ORAL_TABLET | ORAL | Status: AC
  Administered 2016-01-07: 4 mg
  Filled 2016-01-07: qty 1

## 2016-01-07 MED ORDER — SODIUM CHLORIDE 0.9 % IV BOLUS (SEPSIS)
250.0000 mL | Freq: Once | INTRAVENOUS | Status: AC
  Administered 2016-01-07: 250 mL via INTRAVENOUS

## 2016-01-07 MED ORDER — SODIUM CHLORIDE 0.9 % IV BOLUS (SEPSIS)
500.0000 mL | Freq: Once | INTRAVENOUS | Status: AC
Start: 2016-01-07 — End: 2016-01-07
  Administered 2016-01-07: 500 mL via INTRAVENOUS

## 2016-01-07 NOTE — ED Provider Notes (Signed)
[facility]-EMERGENCY DEPT Provider Note   CSN: [id number] Arrival date & time: 01/07/16  1505     History   Chief Complaint Chief Complaint  Patient presents with  . Vomiting  . Abdominal Pain    HPI Russell [name] is a 46 y.o. male.  The history is provided by the patient.  Emesis   This is a new problem. The current episode started 12 to 24 hours ago. The problem occurs more than 10 times per day. The problem has not changed since onset.There has been no fever. Pertinent negatives include no abdominal pain (abdominal soreness from vomiting but no pain otherwise), no chills, no cough, no diarrhea, no fever and no headaches.    Past Medical History:  Diagnosis Date  . Age-related macular degeneration, wet, both eyes (HCC)    "I'm getting Alia treatments" (12/30/2014)  . Anxiety   . Asthma   . 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 Daily headache   . Depression   . DVT (deep venous thrombosis), H/o 01/2014-on Xarelto 03/24/2014   LLE  . Hypertension   . Hypothyroidism   . Nephrotic syndrome 05/18/2014  . NSTEMI (non-ST elevated myocardial infarction) (HCC)   . Pneumonia 11/2013  . Renal insufficiency   . Secondary DM with DKA-AG=16, BIcarb Nl 03/24/2014  . Type 2 diabetes mellitus with diabetic nephropathy [facility])     Patient Active Problem List   Diagnosis Date Noted  . S/P CABG (coronary artery bypass graft) 09/28/2015  . Coronary artery disease involving native coronary artery of native heart with unstable angina pectoris (HCC)   . Hyperglycemia due to type 2 diabetes mellitus (HCC) 09/20/2015  . NSTEMI (non-ST elevated myocardial infarction) (HCC)   . Elevated troponin   . Angina pectoris (HCC) 07/20/2015  . Precordial pain 07/20/2015  . Hyperglycemia 07/20/2015  . DKA  (diabetic ketoacidoses) (HCC) 02/13/2015  . ESRD on dialysis (HCC) 02/13/2015  . Nausea and vomiting 02/13/2015  . Nephrotic syndrome 01/03/2015  . Hypertensive urgency 12/30/2014  . Essential hypertension, benign   . Thyroid activity decreased   . Hx of gastroesophageal reflux (GERD) 09/30/2014  . Prolonged Q-T interval on ECG 09/30/2014  . Acute on chronic renal failure (HCC) 09/24/2014  . Diabetic neuropathy (HCC) 09/21/2014  . Uncontrolled diabetes mellitus type 2 with peripheral artery disease (HCC) 09/21/2014  . Morbid obesity (HCC) 09/21/2014  . Superficial thrombophlebitis 03/24/2014  . Hypothyroidism (acquired) 03/24/2014    Past Surgical History:  Procedure Laterality Date  . ANKLE FRACTURE SURGERY Right 1988  . AV FISTULA PLACEMENT Left 01/01/2015   Procedure: CREATION OF LEFT RADIAL CEPHALIC ARTERIOVENOUS (AV) FISTULA ;  Surgeon: Pryor Ochoa, MD;  Location: [facility] OR;  Service: Vascular;  Laterality: Left;  . CARDIAC CATHETERIZATION N/A 09/22/2015   Procedure: Left Heart Cath and Coronary Angiography;  Surgeon: Iran Ouch, MD;  Location: [facility] INVASIVE CV LAB;  Service: Cardiovascular;  Laterality: N/A;  . CORONARY ARTERY BYPASS GRAFT N/A 09/28/2015   Procedure: CORONARY ARTERY BYPASS GRAFTING (CABG) x 3 UTILIZING LEFT MAMMARY ARTERY AND ENDOSCOPICALLY HARVESTED LEFT GREATER SAPHENOUS VEIN.;  Surgeon: Delight Ovens, MD;  Location: [facility] OR;  Service: Open Heart Surgery;  Laterality: N/A;  . ENDOVEIN HARVEST OF GREATER SAPHENOUS VEIN Left 09/28/2015   Procedure: ENDOVEIN  HARVEST OF GREATER SAPHENOUS VEIN;  Surgeon: Delight Ovens, MD;  Location: [facility] OR;  Service: Open Heart Surgery;  Laterality: Left;  . ESOPHAGOGASTRODUODENOSCOPY N/A 03/29/2014   Procedure: ESOPHAGOGASTRODUODENOSCOPY (EGD);  Surgeon: Dorena Cookey, MD;  Location: [facility];  Service: Endoscopy;  Laterality: N/A;  . EYE SURGERY    . FRACTURE SURGERY    . INSERTION OF DIALYSIS CATHETER Right 01/05/2015    Procedure: INSERTION OF RIGHT INTERNAL JUGULAR DIALYSIS CATHETER;  Surgeon: Fransisco Hertz, MD;  Location: [facility] OR;  Service: Vascular;  Laterality: Right;  . TEE WITHOUT CARDIOVERSION N/A 09/28/2015   Procedure: TRANSESOPHAGEAL ECHOCARDIOGRAM (TEE);  Surgeon: Delight Ovens, MD;  Location[facility] OR;  Service: Open Heart Surgery;  Laterality: N/A;  . TONSILLECTOMY AND ADENOIDECTOMY  1970s       Home Medications    Prior to Admission medications   Medication Sig Start Date End Date Taking? Authorizing Provider  acetaminophen (TYLENOL) 500 MG tablet Take 1 tablet (500 mg total) by mouth every 6 (six) hours as needed for moderate pain. Patient taking differently: Take 1,000 mg by mouth every 6 (six) hours as needed for moderate pain.  09/29/14   Albertine Grates, MD  albuterol (PROVENTIL HFA;VENTOLIN HFA) 108 (90 Base) MCG/ACT inhaler Inhale 1 puff into the lungs every 6 (six) hours as needed for wheezing or shortness of breath. 03/11/15   [name], MD  albuterol-ipratropium (COMBIVENT) 18-103 MCG/ACT inhaler Inhale 2 puffs into the lungs every 4 (four) hours. Patient taking differently: Inhale 2 puffs into the lungs every 6 (six) hours as needed for wheezing or shortness of breath.  09/03/14   [name], MD  aspirin EC 81 MG EC tablet Take 1 tablet (81 mg total) by mouth daily. 10/06/15   Erin R Barrett, PA-C  atorvastatin (LIPITOR) 80 MG tablet Take 1 tablet (80 mg total) by mouth daily at 6 PM. 10/05/15   Erin R Barrett, PA-C  calcitRIOL (ROCALTROL) 0.25 MCG capsule Take 1 capsule (0.25 mcg total) by mouth every Monday, Wednesday, and Friday with hemodialysis. 10/06/15   Erin R Barrett, PA-C  calcium acetate (PHOSLO) 667 MG capsule Take 2 capsules (1,334 mg total) by mouth 3 (three) times daily with meals. 01/08/15   Rolly Salter, MD  citalopram (CELEXA) 40 MG tablet Take 1.5 tablets (60 mg total) by mouth at bedtime. 05/22/15   [name], MD  clonazePAM (KLONOPIN) 2 MG tablet Take 2 mg by mouth  at bedtime.    Historical Provider, MD  clopidogrel (PLAVIX) 75 MG tablet Take 1 tablet (75 mg total) by mouth daily. 10/06/15   Erin R Barrett, PA-C  cyclobenzaprine (FLEXERIL) 10 MG tablet Take 1 tablet (10 mg total) by mouth at bedtime. 05/22/15   [name], MD  gabapentin (NEURONTIN) 100 MG capsule Take 2 capsules (200 mg total) by mouth 3 (three) times daily. Patient taking differently: Take 100-200 mg by mouth 4 (four) times daily as needed.  05/22/15   [name], MD  HUMALOG KWIKPEN 200 UNIT/ML SOPN INJECT 33 UNITS INTO THE SKIN 3 TIMES DAILY BEFORE MEALS 11/29/15   Carlus Pavlov, MD  HYDROmorphone (DILAUDID) 2 MG tablet Take 1 tablet (2 mg total) by mouth every 8 (eight) hours as needed for severe pain. Patient taking differently: Take 2-4 mg by mouth every 8 (eight) hours as needed for severe pain.  05/22/15   [name], MD  hydrOXYzine (ATARAX/VISTARIL) 25 MG tablet Take 25 mg by mouth 2 (two) times daily as needed  for anxiety.     Historical Provider, MD  insulin glargine (LANTUS) 100 UNIT/ML injection Inject 60 Units into the skin 2 (two) times daily.    Historical Provider, MD  insulin lispro (HUMALOG) 100 UNIT/ML injection Inject 0.15-0.3 mLs (15-30 Units total) into the skin every evening. Take one unit per 5 carbs depending on dinner. 11/29/15   Carlus Pavlov, MD  levothyroxine (SYNTHROID, LEVOTHROID) 50 MCG tablet Take 1 tablet (50 mcg total) by mouth daily. 05/22/15   [name], MD  metoCLOPramide (REGLAN) 5 MG tablet Take 1 tablet (5 mg total) by mouth every 8 (eight) hours as needed for nausea. 05/22/15   [name], MD  metoprolol tartrate (LOPRESSOR) 25 MG tablet Take 2 tablets ( 50 mg ) twice a day 10/26/15   Lewayne Bunting, MD  omeprazole (PRILOSEC) 40 MG capsule Take 1 capsule (40 mg total) by mouth 2 (two) times daily. 07/20/15   Srikar Sudini, MD  polyethylene glycol (MIRALAX) packet Take 17 g by mouth daily. 07/20/15   Milagros Loll, MD    promethazine (PHENERGAN) 25 MG tablet Take 1 tablet (25 mg total) by mouth every 8 (eight) hours as needed for nausea. 05/22/15   [name], MD  terbinafine (LAMISIL) 250 MG tablet Take 1 tablet (250 mg total) by mouth daily. 05/22/15   [name], MD    Family History Family History  Problem Relation Age of Onset  . Obesity Mother     Patient states that family members have no other medical illnesses other than what I have described  . Kidney cancer Maternal Grandmother   . Cancer Father 50    AML  . Heart disease      No family history    Social History Social History  Substance Use Topics  . Smoking status: Never Smoker  . Smokeless tobacco: Never Used  . Alcohol use 0.0 oz/week     Allergies   Ibuprofen and Nsaids   Review of Systems Review of Systems  Constitutional: Negative for chills and fever.  Respiratory: Negative for cough and shortness of breath.   Cardiovascular: Negative for chest pain.  Gastrointestinal: Positive for nausea and vomiting. Negative for abdominal distention, abdominal pain (abdominal soreness from vomiting but no pain otherwise) and diarrhea.  Genitourinary: Negative for dysuria.  Neurological: Negative for light-headedness and headaches.  All other systems reviewed and are negative.    Physical Exam Updated Vital Signs BP 168/91 (BP Location: Left Arm)   Pulse 114   Temp 97.7 F (36.5 C) (Oral)   Resp 16   Ht 6\' 1"  (1.854 m)   Wt 120.2 kg   SpO2 93%   BMI 34.96 kg/m   Physical Exam  Constitutional: He appears well-developed and well-nourished. No distress.  HENT:  Head: Normocephalic and atraumatic.  Mouth/Throat: Oropharynx is clear and moist.  Eyes: Conjunctivae are normal. No scleral icterus.  Neck: Neck supple.  Cardiovascular: Normal rate and regular rhythm.   No murmur heard. Pulmonary/Chest: Effort normal and breath sounds normal. No respiratory distress. He has no wheezes. He has no rales.  Abdominal:  Soft. He exhibits no distension. There is no tenderness. There is no guarding.  Musculoskeletal: He exhibits no edema.  Neurological: He is alert.  Skin: Skin is warm and dry.  Psychiatric: He has a normal mood and affect.  Nursing note and vitals reviewed.    ED Treatments / Results  Labs (all labs ordered are listed, but only abnormal results are displayed) Labs Reviewed  COMPREHENSIVE METABOLIC PANEL - Abnormal; Notable for the following:       Result Value   Chloride 91 (*)    Glucose, Bld 353 (*)    BUN 30 (*)    Creatinine, Ser 5.06 (*)    Total Protein 8.3 (*)    GFR calc non Af Amer 13 (*)    GFR calc Af Amer 15 (*)    Anion gap 18 (*)    All other components within normal limits  CBC - Abnormal; Notable for the following:    WBC 11.9 (*)    Hemoglobin 12.4 (*)    HCT 37.6 (*)    RDW 16.5 (*)    All other components within normal limits  BETA-HYDROXYBUTYRIC ACID - Abnormal; Notable for the following:    Beta-Hydroxybutyric Acid 1.62 (*)    All other components within normal limits  CBG MONITORING, ED - Abnormal; Notable for the following:    Glucose-Capillary 303 (*)    All other components within normal limits  LIPASE, BLOOD  URINALYSIS, ROUTINE W REFLEX MICROSCOPIC  BASIC METABOLIC PANEL    EKG  EKG Interpretation None       Radiology No results found.  Procedures Procedures (including critical care time)  Medications Ordered in ED Medications  ondansetron (ZOFRAN-ODT) disintegrating tablet 4 mg (not administered)  insulin aspart (novoLOG) injection 5 Units (not administered)  sodium chloride 0.9 % bolus 250 mL (not administered)  promethazine (PHENERGAN) injection 12.5 mg (not administered)  ondansetron (ZOFRAN-ODT) 4 MG disintegrating tablet (4 mg  Given 01/07/16 1602)  sodium chloride 0.9 % bolus 500 mL (0 mLs Intravenous Stopped 01/07/16 2316)  promethazine (PHENERGAN) injection 12.5 mg (12.5 mg Intravenous Given 01/07/16 2220)     Initial  Impression / Assessment and Plan / ED Course  I have reviewed the triage vital signs and the nursing notes.  Pertinent labs & imaging results that were available during my care of the patient were reviewed by me and considered in my medical decision making (see chart for details).  Clinical Course    Patient is a 46 year old male with history of ESRD on HD, CAD, gastroparesis, hypertension, thyroid disease who presents with 1 day of nausea vomiting. Patient denies any abdominal pain, fever, diarrhea.  Patient did have dialysis today. Patient currently still complaining of nausea. On exam patient has no abdominal tenderness. Patient states post size of his abdomen feels sore from vomiting but does not complain of any pain. Patient has mild leukocytosis port died. Glucose is 353, chloride is low at 91, bicarbonate is normal at 27. Patient does have a mild anion gap of 18. Serum ketones is positive with a beta hydroxybutyric acid level of 1.62. Lipase normal.  Vomiting likely 2/2 gastroparesis. Doubt SBO, or any acute intra-abdominal process at this time.  Given positive ketones and anion gap, we'll give 5 units of IV insulin and plan to recheck BMP one hour after for improvement in anion gap.  Patient given 2 doses of Phenergan with improvement in nausea. Patient was able to tolerate diet ginger ale.  Pt care signed out to Dr. Elesa MassedWard at 2330 with plan to follow up repeat BMP. If improved pt can go home if anion gap unchanged or worsened will need admission for DKA.  Pt seen with attending Dr. Corlis LeakMacKuen.  Final Clinical Impressions(s) / ED Diagnoses   Final diagnoses:  Non-intractable vomiting with nausea, unspecified vomiting type    New Prescriptions New Prescriptions   No medications on file  Dwana Melena, DO 01/07/16 2353    Courteney Randall An, MD 01/12/16 [phone]

## 2016-01-07 NOTE — ED Triage Notes (Signed)
C/o nausea and vomiting onset 5am today ,patient went to dialysis today and completed dialysis.

## 2016-01-07 NOTE — ED Notes (Signed)
Pt given PO fluid challenge.

## 2016-01-07 NOTE — ED Notes (Signed)
Pt reports returning nausea, no emesis.

## 2016-01-08 DIAGNOSIS — K3184 Gastroparesis: Secondary | ICD-10-CM | POA: Diagnosis not present

## 2016-01-08 LAB — URINALYSIS, ROUTINE W REFLEX MICROSCOPIC
Bacteria, UA: NONE SEEN
Bilirubin Urine: NEGATIVE
Hgb urine dipstick: NEGATIVE
Ketones, ur: 5 mg/dL — AB
Leukocytes, UA: NEGATIVE
NITRITE: NEGATIVE
PH: 8 (ref 5.0–8.0)
Protein, ur: >=300 mg/dL — AB
Specific Gravity, Urine: 1.014 (ref 1.005–1.030)

## 2016-01-08 LAB — BASIC METABOLIC PANEL
ANION GAP: 16 — AB (ref 5–15)
BUN: 43 mg/dL — ABNORMAL HIGH (ref 6–20)
CHLORIDE: 92 mmol/L — AB (ref 101–111)
CO2: 30 mmol/L (ref 22–32)
Calcium: 9.3 mg/dL (ref 8.9–10.3)
Creatinine, Ser: 6.02 mg/dL — ABNORMAL HIGH (ref 0.61–1.24)
GFR calc non Af Amer: 10 mL/min — ABNORMAL LOW (ref >60)
GFR, EST AFRICAN AMERICAN: 12 mL/min — AB (ref >60)
Glucose, Bld: 317 mg/dL — ABNORMAL HIGH (ref 65–99)
POTASSIUM: 4 mmol/L (ref 3.5–5.1)
Sodium: 138 mmol/L (ref 135–145)

## 2016-01-08 LAB — CBG MONITORING, ED: Glucose-Capillary: 328 mg/dL — ABNORMAL HIGH (ref 65–99)

## 2016-01-08 MED ORDER — INSULIN ASPART 100 UNIT/ML ~~LOC~~ SOLN: 5.0000 [IU] | Freq: Once | SUBCUTANEOUS | Status: DC

## 2016-01-08 MED ORDER — PROMETHAZINE HCL 25 MG/ML IJ SOLN
12.5000 mg | Freq: Once | INTRAMUSCULAR | Status: AC
  Administered 2016-01-08: 12.5 mg via INTRAVENOUS
  Filled 2016-01-08: qty 1

## 2016-01-08 MED ORDER — PROMETHAZINE HCL 25 MG PO TABS: 25.0000 mg | ORAL_TABLET | Freq: Four times a day (QID) | ORAL | 0 refills | Status: DC | PRN

## 2016-01-08 MED ORDER — INSULIN ASPART 100 UNIT/ML ~~LOC~~ SOLN
5.0000 [IU] | Freq: Once | SUBCUTANEOUS | Status: AC
  Administered 2016-01-08: 5 [IU] via INTRAVENOUS
  Filled 2016-01-08: qty 1

## 2016-01-08 NOTE — ED Notes (Signed)
Pt departed in NAD.  

## 2016-01-08 NOTE — ED Provider Notes (Signed)
1:45 AM  Assumed care from Dr. Corlis LeakMacKuen.  Patient here with abdominal pain, nausea and vomiting. Found to be hyperglycemic but normal bicarbonate slightly elevated anion gap. Also had elevated beta hydroxybutyrate. Plan was to give patient IV insulin and a small amount of IV fluids given he has end-stage renal disease and is on hemodialysis. Plan was to repeat BMP to see if there is any significant change. Repeat BMP drawn before IV insulin even given with worsening anion gap but I feel this could be secondary to uremia especially in the setting of a normal bicarbonate. I did discuss this case with Dr. Minerva Areolaypd. With the hospitalist service and agrees. Patient reports he is feeling much better. At this time myself and Dr. Antionette Charpyd feel we can hold off on admission and insulin drip. Plan is to send off urinalysis and repeat BMP one hour after dose of IV insulin. Blood glucose is now 328.  2:45 AM  Pt's urine shows only 5 ketones. I do not think this is DKA.  3:30 AM  Pt's BMP has improved.  Glucose 317, gap 16, bicarb 30.  He is still mildly tachycardic.  Abdominal exam is benign.  No vomiting in several hours.    4:00 AM  Pt began vomiting again.  He reports he just wants another dose of IV Phenergan and can go home. He has history of gastroparesis. States that his tachycardia is chronic. He feels well enough for discharge. Have offered admission but he refuses. We'll discharge with prescription for Phenergan. Has outpatient follow-up. Discussed return precautions. Recently dialyzed. Does not appear volume overloaded. Potassium normal.   Russell MawKristen N Khoury Siemon, DO 01/08/16 [phone]

## 2016-01-10 ENCOUNTER — Encounter (HOSPITAL_COMMUNITY): Payer: Self-pay | Admitting: Emergency Medicine

## 2016-01-10 ENCOUNTER — Emergency Department (HOSPITAL_COMMUNITY): Payer: Medicare Other

## 2016-01-10 ENCOUNTER — Emergency Department (HOSPITAL_COMMUNITY)
Admission: EM | Admit: 2016-01-10 | Discharge: 2016-01-10 | Disposition: A | Discharge status: Home / Self Care | Payer: Medicare Other | Attending: Emergency Medicine | Admitting: Emergency Medicine

## 2016-01-10 DIAGNOSIS — R112 Nausea with vomiting, unspecified: Secondary | ICD-10-CM | POA: Insufficient documentation

## 2016-01-10 DIAGNOSIS — E1122 Type 2 diabetes mellitus with diabetic chronic kidney disease: Secondary | ICD-10-CM | POA: Insufficient documentation

## 2016-01-10 DIAGNOSIS — I120 Hypertensive chronic kidney disease with stage 5 chronic kidney disease or end stage renal disease: Secondary | ICD-10-CM | POA: Insufficient documentation

## 2016-01-10 DIAGNOSIS — Z951 Presence of aortocoronary bypass graft: Secondary | ICD-10-CM | POA: Insufficient documentation

## 2016-01-10 DIAGNOSIS — I252 Old myocardial infarction: Secondary | ICD-10-CM | POA: Diagnosis not present

## 2016-01-10 DIAGNOSIS — R52 Pain, unspecified: Secondary | ICD-10-CM | POA: Diagnosis not present

## 2016-01-10 DIAGNOSIS — Z79899 Other long term (current) drug therapy: Secondary | ICD-10-CM | POA: Insufficient documentation

## 2016-01-10 DIAGNOSIS — E1140 Type 2 diabetes mellitus with diabetic neuropathy, unspecified: Secondary | ICD-10-CM | POA: Insufficient documentation

## 2016-01-10 DIAGNOSIS — E1151 Type 2 diabetes mellitus with diabetic peripheral angiopathy without gangrene: Secondary | ICD-10-CM | POA: Insufficient documentation

## 2016-01-10 DIAGNOSIS — E1143 Type 2 diabetes mellitus with diabetic autonomic (poly)neuropathy: Secondary | ICD-10-CM | POA: Insufficient documentation

## 2016-01-10 DIAGNOSIS — R0602 Shortness of breath: Secondary | ICD-10-CM | POA: Insufficient documentation

## 2016-01-10 DIAGNOSIS — J45909 Unspecified asthma, uncomplicated: Secondary | ICD-10-CM | POA: Insufficient documentation

## 2016-01-10 DIAGNOSIS — R079 Chest pain, unspecified: Secondary | ICD-10-CM | POA: Diagnosis not present

## 2016-01-10 DIAGNOSIS — Z7982 Long term (current) use of aspirin: Secondary | ICD-10-CM | POA: Diagnosis not present

## 2016-01-10 DIAGNOSIS — Z955 Presence of coronary angioplasty implant and graft: Secondary | ICD-10-CM | POA: Diagnosis not present

## 2016-01-10 DIAGNOSIS — Z992 Dependence on renal dialysis: Secondary | ICD-10-CM | POA: Diagnosis not present

## 2016-01-10 DIAGNOSIS — I2510 Atherosclerotic heart disease of native coronary artery without angina pectoris: Secondary | ICD-10-CM | POA: Diagnosis not present

## 2016-01-10 DIAGNOSIS — N186 End stage renal disease: Secondary | ICD-10-CM | POA: Diagnosis not present

## 2016-01-10 DIAGNOSIS — E039 Hypothyroidism, unspecified: Secondary | ICD-10-CM | POA: Diagnosis not present

## 2016-01-10 DIAGNOSIS — K3184 Gastroparesis: Secondary | ICD-10-CM | POA: Diagnosis not present

## 2016-01-10 DIAGNOSIS — Z794 Long term (current) use of insulin: Secondary | ICD-10-CM | POA: Diagnosis not present

## 2016-01-10 LAB — I-STAT VENOUS BLOOD GAS, ED
ACID-BASE EXCESS: 4 mmol/L — AB (ref 0.0–2.0)
ACID-BASE EXCESS: 5 mmol/L — AB (ref 0.0–2.0)
BICARBONATE: 28.4 mmol/L — AB (ref 20.0–28.0)
Bicarbonate: 26.3 mmol/L (ref 20.0–28.0)
O2 SAT: 91 %
O2 SAT: 97 %
PH VEN: 7.515 — AB (ref 7.250–7.430)
TCO2: 27 mmol/L (ref 0–100)
TCO2: 29 mmol/L (ref 0–100)
pCO2, Ven: 31.3 mmHg — ABNORMAL LOW (ref 44.0–60.0)
pCO2, Ven: 35.2 mmHg — ABNORMAL LOW (ref 44.0–60.0)
pH, Ven: 7.532 — ABNORMAL HIGH (ref 7.250–7.430)
pO2, Ven: 54 mmHg — ABNORMAL HIGH (ref 32.0–45.0)
pO2, Ven: 77 mmHg — ABNORMAL HIGH (ref 32.0–45.0)

## 2016-01-10 LAB — URINALYSIS, ROUTINE W REFLEX MICROSCOPIC
Bilirubin Urine: NEGATIVE
KETONES UR: NEGATIVE mg/dL
LEUKOCYTES UA: NEGATIVE
NITRITE: NEGATIVE
Specific Gravity, Urine: 1.016 (ref 1.005–1.030)
pH: 5 (ref 5.0–8.0)

## 2016-01-10 LAB — COMPREHENSIVE METABOLIC PANEL
ALBUMIN: 4.1 g/dL (ref 3.5–5.0)
ALT: 16 U/L — ABNORMAL LOW (ref 17–63)
ANION GAP: 21 — AB (ref 5–15)
AST: 16 U/L (ref 15–41)
Alkaline Phosphatase: 100 U/L (ref 38–126)
BILIRUBIN TOTAL: 0.8 mg/dL (ref 0.3–1.2)
BUN: 84 mg/dL — ABNORMAL HIGH (ref 6–20)
CALCIUM: 8.7 mg/dL — AB (ref 8.9–10.3)
CO2: 24 mmol/L (ref 22–32)
Chloride: 88 mmol/L — ABNORMAL LOW (ref 101–111)
Creatinine, Ser: 9.77 mg/dL — ABNORMAL HIGH (ref 0.61–1.24)
GFR, EST AFRICAN AMERICAN: 7 mL/min — AB (ref >60)
GFR, EST NON AFRICAN AMERICAN: 6 mL/min — AB (ref >60)
Glucose, Bld: 194 mg/dL — ABNORMAL HIGH (ref 65–99)
POTASSIUM: 3.8 mmol/L (ref 3.5–5.1)
Sodium: 133 mmol/L — ABNORMAL LOW (ref 135–145)
TOTAL PROTEIN: 7.7 g/dL (ref 6.5–8.1)

## 2016-01-10 LAB — CBG MONITORING, ED: Glucose-Capillary: 178 mg/dL — ABNORMAL HIGH (ref 65–99)

## 2016-01-10 LAB — BRAIN NATRIURETIC PEPTIDE: B NATRIURETIC PEPTIDE 5: 182.8 pg/mL — AB (ref 0.0–100.0)

## 2016-01-10 LAB — BETA-HYDROXYBUTYRIC ACID: BETA-HYDROXYBUTYRIC ACID: 0.71 mmol/L — AB (ref 0.05–0.27)

## 2016-01-10 LAB — I-STAT TROPONIN, ED: Troponin i, poc: 0.01 ng/mL (ref 0.00–0.08)

## 2016-01-10 LAB — CBC
HCT: 38.5 % — ABNORMAL LOW (ref 39.0–52.0)
Hemoglobin: 13.1 g/dL (ref 13.0–17.0)
MCH: 26.5 pg (ref 26.0–34.0)
MCHC: 34 g/dL (ref 30.0–36.0)
MCV: 77.8 fL — ABNORMAL LOW (ref 78.0–100.0)
Platelets: 364 10*3/uL (ref 150–400)
RBC: 4.95 MIL/uL (ref 4.22–5.81)
RDW: 16.3 % — ABNORMAL HIGH (ref 11.5–15.5)
WBC: 10.6 10*3/uL — ABNORMAL HIGH (ref 4.0–10.5)

## 2016-01-10 MED ORDER — HEPARIN SODIUM (PORCINE) 1000 UNIT/ML DIALYSIS: 1000.0000 [IU] | INTRAMUSCULAR | Status: DC | PRN

## 2016-01-10 MED ORDER — DARBEPOETIN ALFA 100 MCG/0.5ML IJ SOSY: 80.0000 ug | PREFILLED_SYRINGE | INTRAMUSCULAR | Status: DC

## 2016-01-10 MED ORDER — METOCLOPRAMIDE HCL 5 MG/ML IJ SOLN
10.0000 mg | Freq: Once | INTRAMUSCULAR | Status: AC
  Administered 2016-01-10: 10 mg via INTRAVENOUS
  Filled 2016-01-10: qty 2

## 2016-01-10 MED ORDER — SODIUM CHLORIDE 0.9 % IV SOLN: 100.0000 mL | INTRAVENOUS | Status: DC | PRN

## 2016-01-10 MED ORDER — LIDOCAINE HCL (PF) 1 % IJ SOLN: 5.0000 mL | INTRAMUSCULAR | Status: DC | PRN

## 2016-01-10 MED ORDER — LIDOCAINE-PRILOCAINE 2.5-2.5 % EX CREA: 1.0000 "application " | TOPICAL_CREAM | CUTANEOUS | Status: DC | PRN

## 2016-01-10 MED ORDER — METOCLOPRAMIDE HCL 5 MG PO TABS: 5.0000 mg | ORAL_TABLET | Freq: Three times a day (TID) | ORAL | 0 refills | Status: DC | PRN

## 2016-01-10 MED ORDER — CALCITRIOL 0.25 MCG PO CAPS: 0.2500 ug | ORAL_CAPSULE | ORAL | Status: DC | PRN

## 2016-01-10 MED ORDER — PENTAFLUOROPROP-TETRAFLUOROETH EX AERO: 1.0000 "application " | INHALATION_SPRAY | CUTANEOUS | Status: DC | PRN

## 2016-01-10 MED ORDER — SODIUM CHLORIDE 0.9 % IV BOLUS (SEPSIS)
500.0000 mL | Freq: Once | INTRAVENOUS | Status: AC
  Administered 2016-01-10: 500 mL via INTRAVENOUS

## 2016-01-10 NOTE — ED Notes (Signed)
Patient transported to X-ray 

## 2016-01-10 NOTE — ED Notes (Signed)
Went to triage to get patient placed patient into a room hooked up to the monitor did ekg shown to Dr Lynelle DoctorKnapp

## 2016-01-10 NOTE — ED Triage Notes (Signed)
Here for high sugar and sob n/v since  Friday was just seen for same on the , is a MWF dialysis pt and is missing it now

## 2016-01-10 NOTE — ED Provider Notes (Signed)
[facility]-EMERGENCY DEPT Provider Note   CSN: [id number] Arrival date & time: 01/10/16  1004     History   Chief Complaint Chief Complaint  Patient presents with  . Hyperglycemia  . Shortness of Breath    HPI Russell [name] is a 46 y.o. male.  The history is provided by the patient and a parent.  Emesis   This is a recurrent problem. Episode onset: 5d ago. The problem occurs more than 10 times per day. The problem has not changed since onset.The emesis has an appearance of stomach contents. There has been no fever. Associated symptoms include chills. Pertinent negatives include no abdominal pain (just abdominal soreness from vomiting), no diarrhea, no fever and no headaches.  -Pt seen on Friday for same and was discharged home. Symptoms continued since.  -Last HD on Friday and couldn't go today due to vomiting.  Past Medical History:  Diagnosis Date  . Age-related macular degeneration, wet, both eyes (HCC)    "I'm getting Alia treatments" (12/30/2014)  . Anxiety   . Asthma   . 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 Daily headache   . Depression   . DVT (deep venous thrombosis), H/o 01/2014-on Xarelto 03/24/2014   LLE  . Hypertension   . Hypothyroidism   . Nephrotic syndrome 05/18/2014  . NSTEMI (non-ST elevated myocardial infarction) (HCC)   . Pneumonia 11/2013  . Renal insufficiency   . Secondary DM with DKA-AG=16, BIcarb Nl 03/24/2014  . Type 2 diabetes mellitus with diabetic nephropathy [facility](HCC)     Patient Active Problem List   Diagnosis Date Noted  . S/P CABG (coronary artery bypass graft) 09/28/2015  . Coronary artery disease involving native coronary artery of native heart with unstable angina pectoris (HCC)   . Hyperglycemia due to type 2 diabetes mellitus (HCC) 09/20/2015  .  NSTEMI (non-ST elevated myocardial infarction) (HCC)   . Elevated troponin   . Angina pectoris (HCC) 07/20/2015  . Precordial pain 07/20/2015  . Hyperglycemia 07/20/2015  . DKA (diabetic ketoacidoses) (HCC) 02/13/2015  . ESRD on dialysis (HCC) 02/13/2015  . Nausea and vomiting 02/13/2015  . Nephrotic syndrome 01/03/2015  . Hypertensive urgency 12/30/2014  . Essential hypertension, benign   . Thyroid activity decreased   . Hx of gastroesophageal reflux (GERD) 09/30/2014  . Prolonged Q-T interval on ECG 09/30/2014  . Acute on chronic renal failure (HCC) 09/24/2014  . Diabetic neuropathy (HCC) 09/21/2014  . Uncontrolled diabetes mellitus type 2 with peripheral artery disease (HCC) 09/21/2014  . Morbid obesity (HCC) 09/21/2014  . Superficial thrombophlebitis 03/24/2014  . Hypothyroidism (acquired) 03/24/2014    Past Surgical History:  Procedure Laterality Date  . ANKLE FRACTURE SURGERY Right 1988  . AV FISTULA PLACEMENT Left 01/01/2015   Procedure: CREATION OF LEFT RADIAL CEPHALIC ARTERIOVENOUS (AV) FISTULA ;  Surgeon: Pryor OchoaJames D Lawson, MD;  [facility];  Service: Vascular;  Laterality: Left;  . CARDIAC CATHETERIZATION N/A 09/22/2015   Procedure: Left Heart Cath and Coronary Angiography;  Surgeon: Iran OuchMuhammad A Arida, MD;  Location: [facility] INVASIVE CV LAB;  Service: Cardiovascular;  Laterality: N/A;  . CORONARY ARTERY BYPASS GRAFT N/A 09/28/2015   Procedure: CORONARY ARTERY BYPASS GRAFTING (CABG) x 3 UTILIZING LEFT MAMMARY ARTERY AND ENDOSCOPICALLY HARVESTED LEFT GREATER SAPHENOUS VEIN.;  Surgeon: Gwenith DailyEdward B  Tyrone Sage, MD;  Location: [facility] OR;  Service: Open Heart Surgery;  Laterality: N/A;  . ENDOVEIN HARVEST OF GREATER SAPHENOUS VEIN Left 09/28/2015   Procedure: ENDOVEIN HARVEST OF GREATER SAPHENOUS VEIN;  Surgeon: Delight Ovens, MD;  Location[facility];  Service: Open Heart Surgery;  Laterality: Left;  . ESOPHAGOGASTRODUODENOSCOPY N/A 03/29/2014   Procedure: ESOPHAGOGASTRODUODENOSCOPY (EGD);   Surgeon: Dorena Cookey, MD;  Location: [facility];  Service: Endoscopy;  Laterality: N/A;  . EYE SURGERY    . FRACTURE SURGERY    . INSERTION OF DIALYSIS CATHETER Right 01/05/2015   Procedure: INSERTION OF RIGHT INTERNAL JUGULAR DIALYSIS CATHETER;  Surgeon: Fransisco Hertz, MD;  Location: [facility] OR;  Service: Vascular;  Laterality: Right;  . TEE WITHOUT CARDIOVERSION N/A 09/28/2015   Procedure: TRANSESOPHAGEAL ECHOCARDIOGRAM (TEE);  Surgeon: Delight Ovens, MD;  Location: [facility] OR;  Service: Open Heart Surgery;  Laterality: N/A;  . TONSILLECTOMY AND ADENOIDECTOMY  1970s       Home Medications    Prior to Admission medications   Medication Sig Start Date End Date Taking? Authorizing Provider  acetaminophen (TYLENOL) 500 MG tablet Take 1 tablet (500 mg total) by mouth every 6 (six) hours as needed for moderate pain. Patient taking differently: Take 1,000 mg by mouth every 6 (six) hours as needed for moderate pain.  09/29/14  Yes Albertine Grates, MD  albuterol (PROVENTIL HFA;VENTOLIN HFA) 108 (90 Base) MCG/ACT inhaler Inhale 1 puff into the lungs every 6 (six) hours as needed for wheezing or shortness of breath. 03/11/15  Yes [name], MD  albuterol-ipratropium (COMBIVENT) 18-103 MCG/ACT inhaler Inhale 2 puffs into the lungs every 4 (four) hours. Patient taking differently: Inhale 2 puffs into the lungs every 6 (six) hours as needed for wheezing or shortness of breath.  09/03/14  Yes [name], MD  aspirin EC 81 MG EC tablet Take 1 tablet (81 mg total) by mouth daily. Patient taking differently: Take 81 mg by mouth every morning.  10/06/15  Yes Erin R Barrett, PA-C  atorvastatin (LIPITOR) 80 MG tablet Take 1 tablet (80 mg total) by mouth daily at 6 PM. 10/05/15  Yes Erin R Barrett, PA-C  calcitRIOL (ROCALTROL) 0.25 MCG capsule Take 1 capsule (0.25 mcg total) by mouth every Monday, Wednesday, and Friday with hemodialysis. 10/06/15  Yes Erin R Barrett, PA-C  calcium acetate (PHOSLO) 667 MG capsule Take 2  capsules (1,334 mg total) by mouth 3 (three) times daily with meals. 01/08/15  Yes Rolly Salter, MD  citalopram (CELEXA) 40 MG tablet Take 1.5 tablets (60 mg total) by mouth at bedtime. 05/22/15  Yes [name], MD  clonazePAM (KLONOPIN) 2 MG tablet Take 2 mg by mouth at bedtime.   Yes Historical Provider, MD  clopidogrel (PLAVIX) 75 MG tablet Take 1 tablet (75 mg total) by mouth daily. 10/06/15  Yes Erin R Barrett, PA-C  cyclobenzaprine (FLEXERIL) 10 MG tablet Take 1 tablet (10 mg total) by mouth at bedtime. 05/22/15  Yes [name], MD  gabapentin (NEURONTIN) 100 MG capsule Take 2 capsules (200 mg total) by mouth 3 (three) times daily. Patient taking differently: Take 200 mg by mouth 4 (four) times daily.  05/22/15  Yes [name], MD  HYDROmorphone (DILAUDID) 2 MG tablet Take 1 tablet (2 mg total) by mouth every 8 (eight) hours as needed for severe pain. Patient taking differently: Take 2-4 mg by mouth every 8 (eight) hours as needed for severe pain.  05/22/15  Yes [name], MD  hydrOXYzine (ATARAX/VISTARIL)  25 MG tablet Take 25 mg by mouth 2 (two) times daily as needed for anxiety.    Yes Historical Provider, MD  insulin glargine (LANTUS) 100 UNIT/ML injection Inject 30 Units into the skin 2 (two) times daily.    Yes Historical Provider, MD  insulin lispro (HUMALOG) 100 UNIT/ML injection Inject 0.15-0.3 mLs (15-30 Units total) into the skin every evening. Take one unit per 5 carbs depending on dinner. Patient taking differently: Inject 15-30 Units into the skin 3 (three) times daily with meals. Take one unit per 5 carbs depending on dinner. 11/29/15  Yes Carlus Pavlov, MD  levothyroxine (SYNTHROID, LEVOTHROID) 50 MCG tablet Take 1 tablet (50 mcg total) by mouth daily. 05/22/15  Yes [name], MD  metoCLOPramide (REGLAN) 5 MG tablet Take 1 tablet (5 mg total) by mouth every 8 (eight) hours as needed for nausea. 05/22/15  Yes [name], MD  metoprolol tartrate  (LOPRESSOR) 25 MG tablet Take 2 tablets ( 50 mg ) twice a day Patient taking differently: Take 50 mg by mouth 2 (two) times daily. Take 2 tablets ( 50 mg ) twice a day 10/26/15  Yes Lewayne Bunting, MD  omeprazole (PRILOSEC) 40 MG capsule Take 1 capsule (40 mg total) by mouth 2 (two) times daily. 07/20/15  Yes Srikar Sudini, MD  polyethylene glycol (MIRALAX) packet Take 17 g by mouth daily. Patient taking differently: Take 17 g by mouth daily as needed for mild constipation.  07/20/15  Yes Srikar Sudini, MD  promethazine (PHENERGAN) 25 MG tablet Take 1 tablet (25 mg total) by mouth every 6 (six) hours as needed for nausea or vomiting. 01/08/16  Yes Kristen N Ward, DO    Family History Family History  Problem Relation Age of Onset  . Obesity Mother     Patient states that family members have no other medical illnesses other than what I have described  . Kidney cancer Maternal Grandmother   . Cancer Father 50    AML  . Heart disease      No family history    Social History Social History  Substance Use Topics  . Smoking status: Never Smoker  . Smokeless tobacco: Never Used  . Alcohol use 0.0 oz/week     Allergies   Ibuprofen and Nsaids   Review of Systems Review of Systems  Constitutional: Positive for appetite change, chills and fatigue. Negative for fever.  Eyes: Negative for pain and visual disturbance.  Respiratory: Positive for shortness of breath.   Cardiovascular: Negative for chest pain, palpitations and leg swelling.  Gastrointestinal: Positive for nausea and vomiting. Negative for abdominal pain (just abdominal soreness from vomiting) and diarrhea.  Genitourinary: Negative for dysuria and hematuria.  Skin: Negative for rash.  Neurological: Negative for syncope, light-headedness and headaches.  All other systems reviewed and are negative.    Physical Exam Updated Vital Signs BP (!) 163/101 (BP Location: Right Arm)   Pulse 114   Temp 98.1 F (36.7 C) (Oral)    Resp 18   Ht 6\' 2"  (1.88 m)   Wt 122.5 kg   SpO2 98%   BMI 34.67 kg/m   Physical Exam  Constitutional: He is oriented to person, place, and time. He appears well-developed and well-nourished. No distress.  HENT:  Head: Normocephalic and atraumatic.  Mouth/Throat: Oropharynx is clear and moist.  Eyes: Conjunctivae are normal.  Neck: Neck supple.  Cardiovascular: Normal rate, regular rhythm and normal heart sounds.   No murmur heard. Pulmonary/Chest: Effort normal and  breath sounds normal. No respiratory distress. He has no wheezes. He has no rales.  Abdominal: Soft. He exhibits no distension. There is no tenderness. There is no rebound.  obese  Musculoskeletal: He exhibits no edema.  Neurological: He is alert and oriented to person, place, and time.  Skin: Skin is warm and dry.  Psychiatric: He has a normal mood and affect.  Nursing note and vitals reviewed.    ED Treatments / Results  Labs (all labs ordered are listed, but only abnormal results are displayed) Labs Reviewed  CBC - Abnormal; Notable for the following:       Result Value   WBC 10.6 (*)    HCT 38.5 (*)    MCV 77.8 (*)    RDW 16.3 (*)    All other components within normal limits  URINALYSIS, ROUTINE W REFLEX MICROSCOPIC - Abnormal; Notable for the following:    APPearance HAZY (*)    Glucose, UA >=500 (*)    Hgb urine dipstick SMALL (*)    Protein, ur >=300 (*)    Bacteria, UA RARE (*)    Squamous Epithelial / LPF 0-5 (*)    All other components within normal limits  COMPREHENSIVE METABOLIC PANEL - Abnormal; Notable for the following:    Sodium 133 (*)    Chloride 88 (*)    Glucose, Bld 194 (*)    BUN 84 (*)    Creatinine, Ser 9.77 (*)    Calcium 8.7 (*)    ALT 16 (*)    GFR calc non Af Amer 6 (*)    GFR calc Af Amer 7 (*)    Anion gap 21 (*)    All other components within normal limits  BRAIN NATRIURETIC PEPTIDE - Abnormal; Notable for the following:    B Natriuretic Peptide 182.8 (*)    All  other components within normal limits  BETA-HYDROXYBUTYRIC ACID - Abnormal; Notable for the following:    Beta-Hydroxybutyric Acid 0.71 (*)    All other components within normal limits  CBG MONITORING, ED - Abnormal; Notable for the following:    Glucose-Capillary 178 (*)    All other components within normal limits  BLOOD GAS, VENOUS  CBG MONITORING, ED  I-STAT TROPOININ, ED    EKG  EKG Interpretation  Date/Time:  Monday January 10 2016 16:36:59 EST Ventricular Rate:  115 PR Interval:    QRS Duration: 86 QT Interval:  341 QTC Calculation: 472 R Axis:   -8 Text Interpretation:  Sinus tachycardia Probable left atrial enlargement LVH with secondary repolarization abnormality Anterior Q waves, possibly due to LVH Poor data quality , hyperacute t waves on prior ECG resolved Confirmed by KNAPP  MD-J, JON ([id number]) on 01/10/2016 4:46:41 PM       Radiology Dg Chest 2 View  Result Date: 01/10/2016 CLINICAL DATA:  Nausea and vomiting, chest pain and shortness of breath EXAM: CHEST  2 VIEW COMPARISON:  11/04/2015 FINDINGS: Streaky atelectasis versus developing infiltrate at the lingula. No focal consolidation or effusion. Stable cardiomediastinal silhouette. Post sternotomy changes. No pneumothorax. Contrast in the colon. IMPRESSION: Streaky atelectasis versus mild infiltrate in the lingula. Electronically Signed   By: Jasmine Pang M.D.   On: 01/10/2016 18:19   Dg Abd 2 Views  Result Date: 01/10/2016 CLINICAL DATA:  Nausea and vomiting EXAM: ABDOMEN - 2 VIEW COMPARISON:  None. FINDINGS: Radiopaque material is present within the colon. Few borderline dilated loops of bowel in the left abdomen. Calcified pelvic phlebolith. IMPRESSION: Few borderline  enlarged small bowel loops in the left upper quadrant. Otherwise nonobstructed gas pattern with radiopaque material in the colon and rectum. Electronically Signed   By: Jasmine Pang M.D.   On: 01/10/2016 18:20    Procedures Procedures (including  critical care time)  Medications Ordered in ED Medications  sodium chloride 0.9 % bolus 500 mL (0 mLs Intravenous Stopped 01/10/16 1920)  metoCLOPramide (REGLAN) injection 10 mg (10 mg Intravenous Given 01/10/16 1832)     Initial Impression / Assessment and Plan / ED Course  I have reviewed the triage vital signs and the nursing notes.  Pertinent labs & imaging results that were available during my care of the patient were reviewed by me and considered in my medical decision making (see chart for details).  Clinical Course    Patient is a 46 year old male with history of diabetes, CAD, CAD, hypertension, gastroparesis who presents with continued nausea vomiting over the last 5 days. Patient was seen here on Friday for the same but has continued to have symptoms. Patient gets dialysis Monday/Wednesday/Friday and last one on Friday. Patient was unable to get today due to his nausea and vomiting. Patient denies any diarrhea or fevers at home. Next  Patient is afebrile here but is mildly tachycardic. Patient has a high baseline heart rate. Patient has no abdominal tenderness to exam. Plan to obtain labs to rule out DKA. Glucose is 170s. No ketones in the urine. Serum ketones is lower compared to follow-up on Friday. Patient has anion gap of 21 however is uremic with a BUN in the 80s. Normal bicarb. Pt not in DKA at this time.  Nephrology is consulted regarding scheduling dialysis. Patient's potassium is within normal limits, no signs of fluid overload, no shortness of breath and is not altered. Nephrology recommends holding off on dialysis until tomorrow.  Patient given normal saline bolus 500 mL, 1 dose of IV Reglan. Patient's nausea significantly improved and is able to tolerate by mouth well.  Patient is discharged in good condition. Prescription for Reglan given. Patient to follow-up with PCP in one week as needed.  Patient seen with attending Dr. Lynelle Doctor.  Final Clinical Impressions(s) / ED  Diagnoses   Final diagnoses:  Pain  Non-intractable vomiting with nausea, unspecified vomiting type  Gastroparesis    New Prescriptions New Prescriptions   No medications on file     Dwana Melena, DO 01/10/16 2032    Linwood Dibbles, MD 01/12/16 1714

## 2016-01-11 DIAGNOSIS — N186 End stage renal disease: Secondary | ICD-10-CM | POA: Diagnosis not present

## 2016-01-11 DIAGNOSIS — E211 Secondary hyperparathyroidism, not elsewhere classified: Secondary | ICD-10-CM | POA: Diagnosis not present

## 2016-01-11 DIAGNOSIS — D631 Anemia in chronic kidney disease: Secondary | ICD-10-CM | POA: Diagnosis not present

## 2016-01-11 DIAGNOSIS — E1121 Type 2 diabetes mellitus with diabetic nephropathy: Secondary | ICD-10-CM | POA: Diagnosis not present

## 2016-01-12 DIAGNOSIS — D631 Anemia in chronic kidney disease: Secondary | ICD-10-CM | POA: Diagnosis not present

## 2016-01-12 DIAGNOSIS — E1121 Type 2 diabetes mellitus with diabetic nephropathy: Secondary | ICD-10-CM | POA: Diagnosis not present

## 2016-01-12 DIAGNOSIS — E211 Secondary hyperparathyroidism, not elsewhere classified: Secondary | ICD-10-CM | POA: Diagnosis not present

## 2016-01-12 DIAGNOSIS — N186 End stage renal disease: Secondary | ICD-10-CM | POA: Diagnosis not present

## 2016-01-14 DIAGNOSIS — N186 End stage renal disease: Secondary | ICD-10-CM | POA: Diagnosis not present

## 2016-01-14 DIAGNOSIS — D631 Anemia in chronic kidney disease: Secondary | ICD-10-CM | POA: Diagnosis not present

## 2016-01-14 DIAGNOSIS — E211 Secondary hyperparathyroidism, not elsewhere classified: Secondary | ICD-10-CM | POA: Diagnosis not present

## 2016-01-14 DIAGNOSIS — E1121 Type 2 diabetes mellitus with diabetic nephropathy: Secondary | ICD-10-CM | POA: Diagnosis not present

## 2016-01-17 DIAGNOSIS — N186 End stage renal disease: Secondary | ICD-10-CM | POA: Diagnosis not present

## 2016-01-17 DIAGNOSIS — D631 Anemia in chronic kidney disease: Secondary | ICD-10-CM | POA: Diagnosis not present

## 2016-01-17 DIAGNOSIS — E1121 Type 2 diabetes mellitus with diabetic nephropathy: Secondary | ICD-10-CM | POA: Diagnosis not present

## 2016-01-17 DIAGNOSIS — E211 Secondary hyperparathyroidism, not elsewhere classified: Secondary | ICD-10-CM | POA: Diagnosis not present

## 2016-01-19 DIAGNOSIS — E1121 Type 2 diabetes mellitus with diabetic nephropathy: Secondary | ICD-10-CM | POA: Diagnosis not present

## 2016-01-19 DIAGNOSIS — N186 End stage renal disease: Secondary | ICD-10-CM | POA: Diagnosis not present

## 2016-01-19 DIAGNOSIS — E211 Secondary hyperparathyroidism, not elsewhere classified: Secondary | ICD-10-CM | POA: Diagnosis not present

## 2016-01-19 DIAGNOSIS — D631 Anemia in chronic kidney disease: Secondary | ICD-10-CM | POA: Diagnosis not present

## 2016-01-19 NOTE — Progress Notes (Signed)
HPI: FU CAD. Renal Dopplers May 2016 normal. Echocardiogram September 2017 showed normal LV systolic function, moderate left ventricular hypertrophy. Cardiac catheterization September 2017 showed a 90% ramus, 95% first diagonal, 80% proximal LAD and normal LV function. Carotid Dopplers September 2017 showed 1-39% bilateral stenosis. Patient subsequently had coronary artery bypass and graft with a LIMA to LAD, saphenous vein graft to the diagonal and saphenous vein graft to the obtuse marginal. Since last seen patient denies chest pain, dyspnea, syncope or pedal edema.   Current Outpatient Prescriptions  Medication Sig Dispense Refill  . acetaminophen (TYLENOL) 500 MG tablet Take 1 tablet (500 mg total) by mouth every 6 (six) hours as needed for moderate pain. (Patient taking differently: Take 1,000 mg by mouth every 6 (six) hours as needed for moderate pain. ) 30 tablet 0  . albuterol (PROVENTIL HFA;VENTOLIN HFA) 108 (90 Base) MCG/ACT inhaler Inhale 1 puff into the lungs every 6 (six) hours as needed for wheezing or shortness of breath. 1 Inhaler 11  . albuterol-ipratropium (COMBIVENT) 18-103 MCG/ACT inhaler Inhale 2 puffs into the lungs every 4 (four) hours. (Patient taking differently: Inhale 2 puffs into the lungs every 6 (six) hours as needed for wheezing or shortness of breath. ) 1 Inhaler 11  . amoxicillin-clavulanate (AUGMENTIN) 875-125 MG tablet Take 1 tablet by mouth 2 (two) times daily. 20 tablet 0  . aspirin EC 81 MG EC tablet Take 1 tablet (81 mg total) by mouth daily. (Patient taking differently: Take 81 mg by mouth every morning. )    . atorvastatin (LIPITOR) 80 MG tablet Take 1 tablet (80 mg total) by mouth daily at 6 PM. 30 tablet 3  . calcitRIOL (ROCALTROL) 0.25 MCG capsule Take 1 capsule (0.25 mcg total) by mouth every Monday, Wednesday, and Friday with hemodialysis. 30 capsule 3  . calcium acetate (PHOSLO) 667 MG capsule Take 2 capsules (1,334 mg total) by mouth 3 (three)  times daily with meals. 180 capsule 0  . citalopram (CELEXA) 40 MG tablet Take 1.5 tablets (60 mg total) by mouth at bedtime. 90 tablet 3  . clonazePAM (KLONOPIN) 2 MG tablet Take 2 mg by mouth at bedtime.    . clopidogrel (PLAVIX) 75 MG tablet Take 1 tablet (75 mg total) by mouth daily. 30 tablet 3  . cyclobenzaprine (FLEXERIL) 10 MG tablet Take 1 tablet (10 mg total) by mouth at bedtime. 30 tablet 11  . gabapentin (NEURONTIN) 100 MG capsule Take 2 capsules (200 mg total) by mouth 3 (three) times daily. (Patient taking differently: Take 200 mg by mouth 4 (four) times daily. ) 180 capsule 11  . HYDROcodone-acetaminophen (NORCO/VICODIN) 5-325 MG tablet Take 2 tablets by mouth every 4 (four) hours as needed. 6 tablet 0  . HYDROmorphone (DILAUDID) 2 MG tablet Take 1 tablet (2 mg total) by mouth every 8 (eight) hours as needed for severe pain. (Patient taking differently: Take 2-4 mg by mouth every 8 (eight) hours as needed for severe pain. ) 90 tablet 0  . hydrOXYzine (ATARAX/VISTARIL) 25 MG tablet Take 25 mg by mouth 2 (two) times daily as needed for anxiety.     . insulin glargine (LANTUS) 100 UNIT/ML injection Inject 30 Units into the skin 2 (two) times daily.     . insulin lispro (HUMALOG) 100 UNIT/ML injection Inject 0.15-0.3 mLs (15-30 Units total) into the skin every evening. Take one unit per 5 carbs depending on dinner. (Patient taking differently: Inject 15-30 Units into the skin 3 (three) times  daily with meals. Take one unit per 5 carbs depending on dinner.) 10 mL 2  . levothyroxine (SYNTHROID, LEVOTHROID) 50 MCG tablet Take 1 tablet (50 mcg total) by mouth daily. 90 tablet 3  . metoCLOPramide (REGLAN) 5 MG tablet Take 1 tablet (5 mg total) by mouth every 8 (eight) hours as needed for nausea or vomiting. 30 tablet 0  . metoprolol tartrate (LOPRESSOR) 25 MG tablet Take 2 tablets ( 50 mg ) twice a day (Patient taking differently: Take 50 mg by mouth 2 (two) times daily. Take 2 tablets ( 50 mg )  twice a day) 120 tablet 6  . omeprazole (PRILOSEC) 40 MG capsule Take 1 capsule (40 mg total) by mouth 2 (two) times daily. 60 capsule 0  . polyethylene glycol (MIRALAX) packet Take 17 g by mouth daily. (Patient taking differently: Take 17 g by mouth daily as needed for mild constipation. ) 14 each 0  . promethazine (PHENERGAN) 25 MG tablet Take 1 tablet (25 mg total) by mouth every 6 (six) hours as needed for nausea or vomiting. 15 tablet 0   No current facility-administered medications for this visit.      Past Medical History:  Diagnosis Date  . Age-related macular degeneration, wet, both eyes (HCC)    "I'm getting Alia treatments" (12/30/2014)  . Anxiety   . Asthma   . 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 Daily headache   . Depression   . DVT (deep venous thrombosis), H/o 01/2014-on Xarelto 03/24/2014   LLE  . Hypertension   . Hypothyroidism   . Nephrotic syndrome 05/18/2014  . NSTEMI (non-ST elevated myocardial infarction) (HCC)   . Pneumonia 11/2013  . Renal insufficiency   . Secondary DM with DKA-AG=16, BIcarb Nl 03/24/2014  . Type 2 diabetes mellitus with diabetic nephropathy [facility](HCC)     Past Surgical History:  Procedure Laterality Date  . ANKLE FRACTURE SURGERY Right 1988  . AV FISTULA PLACEMENT Left 01/01/2015   Procedure: CREATION OF LEFT RADIAL CEPHALIC ARTERIOVENOUS (AV) FISTULA ;  Surgeon: Pryor OchoaJames D Lawson, MD;  [facility];  Service: Vascular;  Laterality: Left;  . CARDIAC CATHETERIZATION N/A 09/22/2015   Procedure: Left Heart Cath and Coronary Angiography;  Surgeon: Iran OuchMuhammad A Arida, MD;  [facility] INV[facility] LAB;  Service: Cardiovascular;  Laterality: N/A;  . CORONARY ARTERY BYPASS GRAFT N/A 09/28/2015   Procedure: CORONARY ARTERY BYPASS GRAFTING (CABG) x 3 UTILIZING  LEFT MAMMARY ARTERY AND ENDOSCOPICALLY HARVESTED LEFT GREATER SAPHENOUS VEIN.;  Surgeon: Delight OvensEdward B Gerhardt, MD;  Location: [facility] OR;  Service: Open Heart Surgery;  Laterality: N/A;  . ENDOVEIN HARVEST OF GREATER SAPHENOUS VEIN Left 09/28/2015   Procedure: ENDOVEIN HARVEST OF GREATER SAPHENOUS VEIN;  Surgeon: Delight OvensEdward B Gerhardt, MD;  [facility];  Service: Open Heart Surgery;  Laterality: Left;  . ESOPHAGOGASTRODUODENOSCOPY N/A 03/29/2014   Procedure: ESOPHAGOGASTRODUODENOSCOPY (EGD);  Surgeon: Dorena CookeyJohn Hayes, MD;  [facility];  Service: Endoscopy;  Laterality: N/A;  . EYE SURGERY    . FRACTURE SURGERY    . INSERTION OF DIALYSIS CATHETER Right 01/05/2015   Procedure: INSERTION OF RIGHT INTERNAL JUGULAR DIALYSIS CATHETER;  Surgeon: Fransisco HertzBrian L Chen, MD;  [facility];  Service: Vascular;  Laterality: Right;  . TEE WITHOUT CARDIOVERSION N/A 09/28/2015  Procedure: TRANSESOPHAGEAL ECHOCARDIOGRAM (TEE);  Surgeon: Delight Ovens, MD;  Location: [facility] OR;  Service: Open Heart Surgery;  Laterality: N/A;  . TONSILLECTOMY AND ADENOIDECTOMY  1970s    Social History   Social History  . Marital status: Single    Spouse name: N/A  . Number of children: N/A  . Years of education: N/A   Occupational History  .      Maintenance Curator   Social History Main Topics  . Smoking status: Never Smoker  . Smokeless tobacco: Never Used  . Alcohol use 0.0 oz/week  . Drug use: No  . Sexual activity: Not Currently   Other Topics Concern  . Not on file   Social History Narrative   Patient currently lives with his fiance   Works with facilities   Nonsmoker, nondrinker       Family History  Problem Relation Age of Onset  . Obesity Mother     Patient states that family members have no other medical illnesses other than what I have described  . Kidney cancer Maternal Grandmother   . Cancer Father 50    AML  . Heart disease      No family history    ROS: Fatigue but no fevers or chills,  productive cough, hemoptysis, dysphasia, odynophagia, melena, hematochezia, dysuria, hematuria, rash, seizure activity, orthopnea, PND, pedal edema, claudication. Remaining systems are negative.  Physical Exam: Well-developed well-nourished in no acute distress.  Skin is warm and dry.  HEENT is normal.  Neck is supple. No bruits Chest is clear to auscultation with normal expansion.  Cardiovascular exam is regular rate and rhythm.  Abdominal exam nontender or distended. No masses palpated. Extremities show no edema. neuro grossly intact  A/P  1 coronary artery disease status post coronary artery bypass graft-continue aspirin, Plavix and statin. I will discontinue his Plavix at next office visit.  2 hyperlipidemia-continue statin.  3 end-stage renal disease-management per nephrology.  4 hypertension-blood pressure controlled. Continue present medications.    Olga Millers, MD

## 2016-01-20 ENCOUNTER — Encounter (HOSPITAL_COMMUNITY): Payer: Self-pay | Admitting: Emergency Medicine

## 2016-01-20 ENCOUNTER — Ambulatory Visit (HOSPITAL_COMMUNITY)
Admission: EM | Admit: 2016-01-20 | Discharge: 2016-01-20 | Disposition: A | Discharge status: Home / Self Care | Payer: Medicare Other | Attending: Emergency Medicine | Admitting: Emergency Medicine

## 2016-01-20 DIAGNOSIS — S61231A Puncture wound without foreign body of left index finger without damage to nail, initial encounter: Secondary | ICD-10-CM

## 2016-01-20 DIAGNOSIS — S61230A Puncture wound without foreign body of right index finger without damage to nail, initial encounter: Secondary | ICD-10-CM

## 2016-01-20 DIAGNOSIS — T07XXXA Unspecified multiple injuries, initial encounter: Secondary | ICD-10-CM

## 2016-01-20 DIAGNOSIS — S61031A Puncture wound without foreign body of right thumb without damage to nail, initial encounter: Secondary | ICD-10-CM

## 2016-01-20 DIAGNOSIS — Z23 Encounter for immunization: Secondary | ICD-10-CM | POA: Diagnosis not present

## 2016-01-20 DIAGNOSIS — W540XXA Bitten by dog, initial encounter: Secondary | ICD-10-CM

## 2016-01-20 DIAGNOSIS — S61032A Puncture wound without foreign body of left thumb without damage to nail, initial encounter: Secondary | ICD-10-CM | POA: Diagnosis not present

## 2016-01-20 MED ORDER — HYDROCODONE-ACETAMINOPHEN 5-325 MG PO TABS: 2.0000 | ORAL_TABLET | ORAL | 0 refills | Status: DC | PRN

## 2016-01-20 MED ORDER — TETANUS-DIPHTH-ACELL PERTUSSIS 5-2.5-18.5 LF-MCG/0.5 IM SUSP
INTRAMUSCULAR | Status: AC
  Filled 2016-01-20: qty 0.5

## 2016-01-20 MED ORDER — TETANUS-DIPHTH-ACELL PERTUSSIS 5-2.5-18.5 LF-MCG/0.5 IM SUSP
0.5000 mL | Freq: Once | INTRAMUSCULAR | Status: AC
  Administered 2016-01-20: 0.5 mL via INTRAMUSCULAR

## 2016-01-20 MED ORDER — AMOXICILLIN-POT CLAVULANATE 875-125 MG PO TABS: 1.0000 | ORAL_TABLET | Freq: Two times a day (BID) | ORAL | 0 refills | Status: DC

## 2016-01-20 NOTE — ED Triage Notes (Signed)
Pt reports he got bit by his dogs today while trying to break up a fight  Reports both dogs are up to date w/vaccinations  Has puncture wounds to bilateral thumbs  A&O x4... NAD

## 2016-01-20 NOTE — ED Provider Notes (Signed)
CSN: [id number]     Arrival date & time 01/20/16  1458 History   None    Chief Complaint  Patient presents with  . Animal Bite   (Consider location/radiation/quality/duration/timing/severity/associated sxs/prior Treatment) Patient reports being bitten by his dog who has utd rabies today.  The wounds are on his bilateral fingers.  He was trying to break up a dog fight and was bitten.   The history is provided by the patient.  Animal Bite  Contact animal:  Dog Location:  Finger Finger injury location:  L thumb, R thumb, L index finger and R index finger Time since incident:  3 hours Pain details:    Quality:  Aching   Severity:  Moderate   Timing:  Constant   Progression:  Worsening Incident location:  Home Provoked: unprovoked   Animal in possession: no   Tetanus status:  Up to date Relieved by:  None tried Worsened by:  Nothing Ineffective treatments:  None tried   Past Medical History:  Diagnosis Date  . Age-related macular degeneration, wet, both eyes (HCC)    "I'm getting Alia treatments" (12/30/2014)  . Anxiety   . Asthma   . 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 Daily headache   . Depression   . DVT (deep venous thrombosis), H/o 01/2014-on Xarelto 03/24/2014   LLE  . Hypertension   . Hypothyroidism   . Nephrotic syndrome 05/18/2014  . NSTEMI (non-ST elevated myocardial infarction) (HCC)   . Pneumonia 11/2013  . Renal insufficiency   . Secondary DM with DKA-AG=16, BIcarb Nl 03/24/2014  . Type 2 diabetes mellitus with diabetic nephropathy [facility](HCC)    Past Surgical History:  Procedure Laterality Date  . ANKLE FRACTURE SURGERY Right 1988  . AV FISTULA PLACEMENT Left 01/01/2015   Procedure: CREATION OF LEFT RADIAL CEPHALIC ARTERIOVENOUS (AV) FISTULA ;  Surgeon: Pryor OchoaJames D  Lawson, MD;  [facility];  Service: Vascular;  Laterality: Left;  . CARDIAC CATHETERIZATION N/A 09/22/2015   Procedure: Left Heart Cath and Coronary Angiography;  Surgeon: Iran OuchMuhammad A Arida, MD;  [facility] LAB;  Service: Cardiovascular;  Laterality: N/A;  . CORONARY ARTERY BYPASS GRAFT N/A 09/28/2015   Procedure: CORONARY ARTERY BYPASS GRAFTING (CABG) x 3 UTILIZING LEFT MAMMARY ARTERY AND ENDOSCOPICALLY HARVESTED LEFT GREATER SAPHENOUS VEIN.;  Surgeon: Delight OvensEdward B Gerhardt, MD;  [facility] OR;  Service: Open Heart Surgery;  Laterality: N/A;  . ENDOVEIN HARVEST OF GREATER SAPHENOUS VEIN Left 09/28/2015   Procedure: ENDOVEIN HARVEST OF GREATER SAPHENOUS VEIN;  Surgeon: Delight OvensEdward B Gerhardt, MD;  [facility];  Service: Open Heart Surgery;  Laterality: Left;  . ESOPHAGOGASTRODUODENOSCOPY N/A 03/29/2014   Procedure: ESOPHAGOGASTRODUODENOSCOPY (EGD);  Surgeon: Dorena CookeyJohn Hayes, MD;  [facility];  Service: Endoscopy;  Laterality: N/A;  . EYE SURGERY    . FRACTURE SURGERY    . INSERTION OF DIALYSIS CATHETER Right 01/05/2015   Procedure: INSERTION OF RIGHT INTERNAL JUGULAR DIALYSIS CATHETER;  Surgeon: Fransisco HertzBrian L Chen, MD;  [facility];  Service: Vascular;  Laterality: Right;  . TEE WITHOUT CARDIOVERSION N/A 09/28/2015   Procedure: TRANSESOPHAGEAL ECHOCARDIOGRAM (TEE);  Surgeon: Delight OvensEdward B Gerhardt, MD;  [facility];  Service: Open Heart Surgery;  Laterality: N/A;  . TONSILLECTOMY AND ADENOIDECTOMY  1970s   Family History  Problem Relation Age of Onset  . Obesity Mother     Patient states that family members have no other medical illnesses other than what I have described  . Kidney cancer Maternal Grandmother   . Cancer Father 50    AML  . Heart disease      No family history   Social History  Substance Use Topics  . Smoking status: Never Smoker  . Smokeless tobacco: Never Used  . Alcohol use 0.0 oz/week    Review of Systems  Constitutional: Negative.   HENT: Negative.    Eyes: Negative.   Respiratory: Negative.   Cardiovascular: Negative.   Gastrointestinal: Negative.   Endocrine: Negative.   Musculoskeletal: Positive for joint swelling.  Skin: Positive for wound.  Allergic/Immunologic: Negative.   Neurological: Negative.   Hematological: Negative.   Psychiatric/Behavioral: Negative.     Allergies  Ibuprofen and Nsaids  Home Medications   Prior to Admission medications   Medication Sig Start Date End Date Taking? Authorizing Provider  aspirin EC 81 MG EC tablet Take 1 tablet (81 mg total) by mouth daily. Patient taking differently: Take 81 mg by mouth every morning.  10/06/15  Yes Erin R Barrett, PA-C  atorvastatin (LIPITOR) 80 MG tablet Take 1 tablet (80 mg total) by mouth daily at 6 PM. 10/05/15  Yes Erin R Barrett, PA-C  calcitRIOL (ROCALTROL) 0.25 MCG capsule Take 1 capsule (0.25 mcg total) by mouth every Monday, Wednesday, and Friday with hemodialysis. 10/06/15  Yes Erin R Barrett, PA-C  calcium acetate (PHOSLO) 667 MG capsule Take 2 capsules (1,334 mg total) by mouth 3 (three) times daily with meals. 01/08/15  Yes Rolly Salter, MD  citalopram (CELEXA) 40 MG tablet Take 1.5 tablets (60 mg total) by mouth at bedtime. 05/22/15  Yes [name], MD  clonazePAM (KLONOPIN) 2 MG tablet Take 2 mg by mouth at bedtime.   Yes Historical Provider, MD  clopidogrel (PLAVIX) 75 MG tablet Take 1 tablet (75 mg total) by mouth daily. 10/06/15  Yes Erin R Barrett, PA-C  cyclobenzaprine (FLEXERIL) 10 MG tablet Take 1 tablet (10 mg total) by mouth at bedtime. 05/22/15  Yes [name], MD  gabapentin (NEURONTIN) 100 MG capsule Take 2 capsules (200 mg total) by mouth 3 (three) times daily. Patient taking differently: Take 200 mg by mouth 4 (four) times daily.  05/22/15  Yes [name], MD  HYDROmorphone (DILAUDID) 2 MG tablet Take 1 tablet (2 mg total) by mouth every 8 (eight) hours as needed for severe pain. Patient taking differently: Take 2-4 mg by mouth  every 8 (eight) hours as needed for severe pain.  05/22/15  Yes [name], MD  hydrOXYzine (ATARAX/VISTARIL) 25 MG tablet Take 25 mg by mouth 2 (two) times daily as needed for anxiety.    Yes Historical Provider, MD  insulin glargine (LANTUS) 100 UNIT/ML injection Inject 30 Units into the skin 2 (two) times daily.    Yes Historical Provider, MD  insulin lispro (HUMALOG) 100 UNIT/ML injection Inject 0.15-0.3 mLs (15-30 Units total) into the skin every evening. Take one unit per 5 carbs depending on dinner. Patient taking differently: Inject 15-30 Units into the skin 3 (three) times daily with meals. Take one unit per 5 carbs depending on dinner. 11/29/15  Yes Carlus Pavlov, MD  levothyroxine (SYNTHROID, LEVOTHROID) 50 MCG tablet Take 1 tablet (50 mcg total) by mouth daily. 05/22/15  Yes [name], MD  metoCLOPramide (REGLAN) 5 MG tablet Take 1 tablet (5 mg total) by  mouth every 8 (eight) hours as needed for nausea or vomiting. 01/10/16  Yes Dwana Melena, DO  metoprolol tartrate (LOPRESSOR) 25 MG tablet Take 2 tablets ( 50 mg ) twice a day Patient taking differently: Take 50 mg by mouth 2 (two) times daily. Take 2 tablets ( 50 mg ) twice a day 10/26/15  Yes Lewayne Bunting, MD  omeprazole (PRILOSEC) 40 MG capsule Take 1 capsule (40 mg total) by mouth 2 (two) times daily. 07/20/15  Yes Srikar Sudini, MD  promethazine (PHENERGAN) 25 MG tablet Take 1 tablet (25 mg total) by mouth every 6 (six) hours as needed for nausea or vomiting. 01/08/16  Yes Kristen N Ward, DO  acetaminophen (TYLENOL) 500 MG tablet Take 1 tablet (500 mg total) by mouth every 6 (six) hours as needed for moderate pain. Patient taking differently: Take 1,000 mg by mouth every 6 (six) hours as needed for moderate pain.  09/29/14   Albertine Grates, MD  albuterol (PROVENTIL HFA;VENTOLIN HFA) 108 (90 Base) MCG/ACT inhaler Inhale 1 puff into the lungs every 6 (six) hours as needed for wheezing or shortness of breath. 03/11/15   [name], MD   albuterol-ipratropium (COMBIVENT) 18-103 MCG/ACT inhaler Inhale 2 puffs into the lungs every 4 (four) hours. Patient taking differently: Inhale 2 puffs into the lungs every 6 (six) hours as needed for wheezing or shortness of breath.  09/03/14   [name], MD  amoxicillin-clavulanate (AUGMENTIN) 875-125 MG tablet Take 1 tablet by mouth 2 (two) times daily. 01/20/16   Deatra Canter, FNP  HYDROcodone-acetaminophen (NORCO/VICODIN) 5-325 MG tablet Take 2 tablets by mouth every 4 (four) hours as needed. 01/20/16   Deatra Canter, FNP  polyethylene glycol [facility]) packet Take 17 g by mouth daily. Patient taking differently: Take 17 g by mouth daily as needed for mild constipation.  07/20/15   Milagros Loll, MD   Meds Ordered and Administered this Visit   Medications  Tdap (BOOSTRIX) injection 0.5 mL (not administered)    BP (!) 188/103 (BP Location: Left Arm)   Pulse 101   Temp 98.4 F (36.9 C) (Oral)   Resp 20   SpO2 98%  No data found.   Physical Exam  Constitutional: He appears well-developed and well-nourished.  HENT:  Head: Normocephalic and atraumatic.  Eyes: Conjunctivae and EOM are normal. Pupils are equal, round, and reactive to light.  Neck: Normal range of motion. Neck supple.  Cardiovascular: Normal rate, regular rhythm and normal heart sounds.   Pulmonary/Chest: Effort normal and breath sounds normal.  Skin:  Puncture wounds bilateral thumbs and index fingers.  Nursing note and vitals reviewed.   Urgent Care Course     Wound repair Date/Time: 01/20/2016 5:19 PM Performed by: Deatra Canter Authorized by: Charm Rings  Consent: Verbal consent obtained. Consent given by: patient Patient understanding: patient states understanding of the procedure being performed Patient consent: the patient's understanding of the procedure matches consent given Local anesthesia used: no  Anesthesia: Local anesthesia used: no  Sedation: Patient sedated: no Patient  tolerance: Patient tolerated the procedure well with no immediate complications Comments: Bilateral thumbs and index fingers were soaked in betadine and then irrigated with copious amount of saline.  Wounds were covered with bacitracin ointment and then bandaids were applied.    (including critical care time)  Labs Review Labs Reviewed - No data to display  Imaging Review No results found.   Visual Acuity Review  Right Eye Distance:   Left Eye Distance:  Bilateral Distance:    Right Eye Near:   Left Eye Near:    Bilateral Near:         MDM   1. Dog bite, initial encounter   2. Multiple puncture wounds    Wound care of fingers Bacitracin ointment TDAP Norco 5/325 one po q 6 hours prn pain Augmentin 875mg  one po bid x 10 days.  Follow up prn      Deatra Canter, FNP 01/20/16 [phone]

## 2016-01-21 DIAGNOSIS — E1121 Type 2 diabetes mellitus with diabetic nephropathy: Secondary | ICD-10-CM | POA: Diagnosis not present

## 2016-01-21 DIAGNOSIS — N186 End stage renal disease: Secondary | ICD-10-CM | POA: Diagnosis not present

## 2016-01-21 DIAGNOSIS — D631 Anemia in chronic kidney disease: Secondary | ICD-10-CM | POA: Diagnosis not present

## 2016-01-21 DIAGNOSIS — E211 Secondary hyperparathyroidism, not elsewhere classified: Secondary | ICD-10-CM | POA: Diagnosis not present

## 2016-01-24 DIAGNOSIS — D631 Anemia in chronic kidney disease: Secondary | ICD-10-CM | POA: Diagnosis not present

## 2016-01-24 DIAGNOSIS — N186 End stage renal disease: Secondary | ICD-10-CM | POA: Diagnosis not present

## 2016-01-24 DIAGNOSIS — E211 Secondary hyperparathyroidism, not elsewhere classified: Secondary | ICD-10-CM | POA: Diagnosis not present

## 2016-01-24 DIAGNOSIS — E1121 Type 2 diabetes mellitus with diabetic nephropathy: Secondary | ICD-10-CM | POA: Diagnosis not present

## 2016-01-24 DIAGNOSIS — Z992 Dependence on renal dialysis: Secondary | ICD-10-CM | POA: Diagnosis not present

## 2016-01-24 DIAGNOSIS — I25810 Atherosclerosis of coronary artery bypass graft(s) without angina pectoris: Secondary | ICD-10-CM | POA: Diagnosis not present

## 2016-01-26 DIAGNOSIS — D631 Anemia in chronic kidney disease: Secondary | ICD-10-CM | POA: Diagnosis not present

## 2016-01-26 DIAGNOSIS — E1121 Type 2 diabetes mellitus with diabetic nephropathy: Secondary | ICD-10-CM | POA: Diagnosis not present

## 2016-01-26 DIAGNOSIS — N186 End stage renal disease: Secondary | ICD-10-CM | POA: Diagnosis not present

## 2016-01-26 DIAGNOSIS — E211 Secondary hyperparathyroidism, not elsewhere classified: Secondary | ICD-10-CM | POA: Diagnosis not present

## 2016-01-27 ENCOUNTER — Ambulatory Visit (INDEPENDENT_AMBULATORY_CARE_PROVIDER_SITE_OTHER): Payer: Medicare Other | Admitting: Cardiology

## 2016-01-27 ENCOUNTER — Encounter: Payer: Self-pay | Admitting: Cardiology

## 2016-01-27 ENCOUNTER — Telehealth: Payer: Self-pay | Admitting: *Deleted

## 2016-01-27 VITALS — BP 112/75 | HR 84 | Ht 74.0 in | Wt 263.0 lb

## 2016-01-27 DIAGNOSIS — I10 Essential (primary) hypertension: Secondary | ICD-10-CM | POA: Diagnosis not present

## 2016-01-27 DIAGNOSIS — E7800 Pure hypercholesterolemia, unspecified: Secondary | ICD-10-CM

## 2016-01-27 DIAGNOSIS — I2510 Atherosclerotic heart disease of native coronary artery without angina pectoris: Secondary | ICD-10-CM

## 2016-01-27 MED ORDER — METOPROLOL TARTRATE 25 MG PO TABS: 50.0000 mg | ORAL_TABLET | Freq: Two times a day (BID) | ORAL | 3 refills | Status: DC

## 2016-01-27 MED ORDER — ATORVASTATIN CALCIUM 80 MG PO TABS: 80.0000 mg | ORAL_TABLET | Freq: Every day | ORAL | 3 refills | Status: DC

## 2016-01-27 MED ORDER — CLOPIDOGREL BISULFATE 75 MG PO TABS: 75.0000 mg | ORAL_TABLET | Freq: Every day | ORAL | 3 refills | Status: DC

## 2016-01-27 NOTE — Patient Instructions (Signed)
Medication Instructions:   REFILL HAVE BEEN SENT TO THE PHARMACY ELECTRONICALLY  Follow-Up:  Your physician wants you to follow-up in: 6 MONTHS WITH DR Jens SomRENSHAW You will receive a reminder letter in the mail two months in advance. If you don't receive a letter, please call our office to schedule the follow-up appointment.   If you need a refill on your cardiac medications before your next appointment, please call your pharmacy.

## 2016-01-27 NOTE — Telephone Encounter (Signed)
Per dr Jens Somcrenshaw, the patient is okay for PD cath placement and he can hold the plavix 5 days prior to the procedure and resume the dayu after. This note will be faxed to the number provided.

## 2016-01-28 DIAGNOSIS — N186 End stage renal disease: Secondary | ICD-10-CM | POA: Diagnosis not present

## 2016-01-28 DIAGNOSIS — E211 Secondary hyperparathyroidism, not elsewhere classified: Secondary | ICD-10-CM | POA: Diagnosis not present

## 2016-01-28 DIAGNOSIS — D631 Anemia in chronic kidney disease: Secondary | ICD-10-CM | POA: Diagnosis not present

## 2016-01-28 DIAGNOSIS — E1121 Type 2 diabetes mellitus with diabetic nephropathy: Secondary | ICD-10-CM | POA: Diagnosis not present

## 2016-01-31 DIAGNOSIS — N186 End stage renal disease: Secondary | ICD-10-CM | POA: Diagnosis not present

## 2016-01-31 DIAGNOSIS — E1121 Type 2 diabetes mellitus with diabetic nephropathy: Secondary | ICD-10-CM | POA: Diagnosis not present

## 2016-01-31 DIAGNOSIS — D631 Anemia in chronic kidney disease: Secondary | ICD-10-CM | POA: Diagnosis not present

## 2016-01-31 DIAGNOSIS — E211 Secondary hyperparathyroidism, not elsewhere classified: Secondary | ICD-10-CM | POA: Diagnosis not present

## 2016-02-02 DIAGNOSIS — N049 Nephrotic syndrome with unspecified morphologic changes: Secondary | ICD-10-CM | POA: Diagnosis not present

## 2016-02-02 DIAGNOSIS — Z992 Dependence on renal dialysis: Secondary | ICD-10-CM | POA: Diagnosis not present

## 2016-02-02 DIAGNOSIS — E1121 Type 2 diabetes mellitus with diabetic nephropathy: Secondary | ICD-10-CM | POA: Diagnosis not present

## 2016-02-02 DIAGNOSIS — D631 Anemia in chronic kidney disease: Secondary | ICD-10-CM | POA: Diagnosis not present

## 2016-02-02 DIAGNOSIS — E211 Secondary hyperparathyroidism, not elsewhere classified: Secondary | ICD-10-CM | POA: Diagnosis not present

## 2016-02-02 DIAGNOSIS — N186 End stage renal disease: Secondary | ICD-10-CM | POA: Diagnosis not present

## 2016-02-04 DIAGNOSIS — N186 End stage renal disease: Secondary | ICD-10-CM | POA: Diagnosis not present

## 2016-02-04 DIAGNOSIS — E1121 Type 2 diabetes mellitus with diabetic nephropathy: Secondary | ICD-10-CM | POA: Diagnosis not present

## 2016-02-04 DIAGNOSIS — D631 Anemia in chronic kidney disease: Secondary | ICD-10-CM | POA: Diagnosis not present

## 2016-02-04 DIAGNOSIS — E211 Secondary hyperparathyroidism, not elsewhere classified: Secondary | ICD-10-CM | POA: Diagnosis not present

## 2016-02-07 DIAGNOSIS — E1121 Type 2 diabetes mellitus with diabetic nephropathy: Secondary | ICD-10-CM | POA: Diagnosis not present

## 2016-02-07 DIAGNOSIS — E211 Secondary hyperparathyroidism, not elsewhere classified: Secondary | ICD-10-CM | POA: Diagnosis not present

## 2016-02-07 DIAGNOSIS — N186 End stage renal disease: Secondary | ICD-10-CM | POA: Diagnosis not present

## 2016-02-07 DIAGNOSIS — D631 Anemia in chronic kidney disease: Secondary | ICD-10-CM | POA: Diagnosis not present

## 2016-02-08 DIAGNOSIS — R079 Chest pain, unspecified: Secondary | ICD-10-CM | POA: Diagnosis not present

## 2016-02-08 DIAGNOSIS — I2510 Atherosclerotic heart disease of native coronary artery without angina pectoris: Secondary | ICD-10-CM | POA: Diagnosis not present

## 2016-02-08 DIAGNOSIS — Z7902 Long term (current) use of antithrombotics/antiplatelets: Secondary | ICD-10-CM | POA: Diagnosis not present

## 2016-02-08 DIAGNOSIS — Z9889 Other specified postprocedural states: Secondary | ICD-10-CM | POA: Diagnosis not present

## 2016-02-08 DIAGNOSIS — J449 Chronic obstructive pulmonary disease, unspecified: Secondary | ICD-10-CM | POA: Diagnosis not present

## 2016-02-08 DIAGNOSIS — N186 End stage renal disease: Secondary | ICD-10-CM | POA: Diagnosis not present

## 2016-02-08 DIAGNOSIS — E1122 Type 2 diabetes mellitus with diabetic chronic kidney disease: Secondary | ICD-10-CM | POA: Diagnosis not present

## 2016-02-08 DIAGNOSIS — Z86718 Personal history of other venous thrombosis and embolism: Secondary | ICD-10-CM | POA: Diagnosis not present

## 2016-02-08 DIAGNOSIS — Z794 Long term (current) use of insulin: Secondary | ICD-10-CM | POA: Diagnosis not present

## 2016-02-08 DIAGNOSIS — Z951 Presence of aortocoronary bypass graft: Secondary | ICD-10-CM | POA: Diagnosis not present

## 2016-02-08 DIAGNOSIS — Z79899 Other long term (current) drug therapy: Secondary | ICD-10-CM | POA: Diagnosis not present

## 2016-02-08 DIAGNOSIS — E7800 Pure hypercholesterolemia, unspecified: Secondary | ICD-10-CM | POA: Diagnosis not present

## 2016-02-08 DIAGNOSIS — J45909 Unspecified asthma, uncomplicated: Secondary | ICD-10-CM | POA: Diagnosis not present

## 2016-02-08 DIAGNOSIS — N183 Chronic kidney disease, stage 3 (moderate): Secondary | ICD-10-CM | POA: Diagnosis not present

## 2016-02-08 DIAGNOSIS — I129 Hypertensive chronic kidney disease with stage 1 through stage 4 chronic kidney disease, or unspecified chronic kidney disease: Secondary | ICD-10-CM | POA: Diagnosis not present

## 2016-02-09 DIAGNOSIS — E211 Secondary hyperparathyroidism, not elsewhere classified: Secondary | ICD-10-CM | POA: Diagnosis not present

## 2016-02-09 DIAGNOSIS — N186 End stage renal disease: Secondary | ICD-10-CM | POA: Diagnosis not present

## 2016-02-09 DIAGNOSIS — D631 Anemia in chronic kidney disease: Secondary | ICD-10-CM | POA: Diagnosis not present

## 2016-02-09 DIAGNOSIS — E1121 Type 2 diabetes mellitus with diabetic nephropathy: Secondary | ICD-10-CM | POA: Diagnosis not present

## 2016-02-11 DIAGNOSIS — E1121 Type 2 diabetes mellitus with diabetic nephropathy: Secondary | ICD-10-CM | POA: Diagnosis not present

## 2016-02-11 DIAGNOSIS — D631 Anemia in chronic kidney disease: Secondary | ICD-10-CM | POA: Diagnosis not present

## 2016-02-11 DIAGNOSIS — N186 End stage renal disease: Secondary | ICD-10-CM | POA: Diagnosis not present

## 2016-02-11 DIAGNOSIS — E211 Secondary hyperparathyroidism, not elsewhere classified: Secondary | ICD-10-CM | POA: Diagnosis not present

## 2016-02-14 DIAGNOSIS — N186 End stage renal disease: Secondary | ICD-10-CM | POA: Diagnosis not present

## 2016-02-14 DIAGNOSIS — E1121 Type 2 diabetes mellitus with diabetic nephropathy: Secondary | ICD-10-CM | POA: Diagnosis not present

## 2016-02-14 DIAGNOSIS — E211 Secondary hyperparathyroidism, not elsewhere classified: Secondary | ICD-10-CM | POA: Diagnosis not present

## 2016-02-14 DIAGNOSIS — D631 Anemia in chronic kidney disease: Secondary | ICD-10-CM | POA: Diagnosis not present

## 2016-02-16 DIAGNOSIS — N186 End stage renal disease: Secondary | ICD-10-CM | POA: Diagnosis not present

## 2016-02-16 DIAGNOSIS — E1121 Type 2 diabetes mellitus with diabetic nephropathy: Secondary | ICD-10-CM | POA: Diagnosis not present

## 2016-02-16 DIAGNOSIS — D631 Anemia in chronic kidney disease: Secondary | ICD-10-CM | POA: Diagnosis not present

## 2016-02-16 DIAGNOSIS — E211 Secondary hyperparathyroidism, not elsewhere classified: Secondary | ICD-10-CM | POA: Diagnosis not present

## 2016-02-18 DIAGNOSIS — D631 Anemia in chronic kidney disease: Secondary | ICD-10-CM | POA: Diagnosis not present

## 2016-02-18 DIAGNOSIS — E1121 Type 2 diabetes mellitus with diabetic nephropathy: Secondary | ICD-10-CM | POA: Diagnosis not present

## 2016-02-18 DIAGNOSIS — E211 Secondary hyperparathyroidism, not elsewhere classified: Secondary | ICD-10-CM | POA: Diagnosis not present

## 2016-02-18 DIAGNOSIS — N186 End stage renal disease: Secondary | ICD-10-CM | POA: Diagnosis not present

## 2016-02-19 DIAGNOSIS — M25522 Pain in left elbow: Secondary | ICD-10-CM | POA: Diagnosis not present

## 2016-02-19 DIAGNOSIS — M7042 Prepatellar bursitis, left knee: Secondary | ICD-10-CM | POA: Diagnosis not present

## 2016-02-21 ENCOUNTER — Encounter (HOSPITAL_COMMUNITY): Payer: Self-pay | Admitting: Family Medicine

## 2016-02-21 ENCOUNTER — Telehealth (HOSPITAL_COMMUNITY): Payer: Self-pay | Admitting: Family Medicine

## 2016-02-21 DIAGNOSIS — D631 Anemia in chronic kidney disease: Secondary | ICD-10-CM | POA: Diagnosis not present

## 2016-02-21 DIAGNOSIS — E1121 Type 2 diabetes mellitus with diabetic nephropathy: Secondary | ICD-10-CM | POA: Diagnosis not present

## 2016-02-21 DIAGNOSIS — E211 Secondary hyperparathyroidism, not elsewhere classified: Secondary | ICD-10-CM | POA: Diagnosis not present

## 2016-02-21 DIAGNOSIS — N186 End stage renal disease: Secondary | ICD-10-CM | POA: Diagnosis not present

## 2016-02-21 NOTE — Telephone Encounter (Signed)
Mailed letter with Cardiac Rehab Program along with my chart message... KJ  °

## 2016-02-23 DIAGNOSIS — D631 Anemia in chronic kidney disease: Secondary | ICD-10-CM | POA: Diagnosis not present

## 2016-02-23 DIAGNOSIS — N186 End stage renal disease: Secondary | ICD-10-CM | POA: Diagnosis not present

## 2016-02-23 DIAGNOSIS — E211 Secondary hyperparathyroidism, not elsewhere classified: Secondary | ICD-10-CM | POA: Diagnosis not present

## 2016-02-23 DIAGNOSIS — E1121 Type 2 diabetes mellitus with diabetic nephropathy: Secondary | ICD-10-CM | POA: Diagnosis not present

## 2016-02-25 DIAGNOSIS — E211 Secondary hyperparathyroidism, not elsewhere classified: Secondary | ICD-10-CM | POA: Diagnosis not present

## 2016-02-25 DIAGNOSIS — D631 Anemia in chronic kidney disease: Secondary | ICD-10-CM | POA: Diagnosis not present

## 2016-02-25 DIAGNOSIS — N186 End stage renal disease: Secondary | ICD-10-CM | POA: Diagnosis not present

## 2016-02-25 DIAGNOSIS — E1121 Type 2 diabetes mellitus with diabetic nephropathy: Secondary | ICD-10-CM | POA: Diagnosis not present

## 2016-02-28 DIAGNOSIS — N186 End stage renal disease: Secondary | ICD-10-CM | POA: Diagnosis not present

## 2016-02-28 DIAGNOSIS — E1121 Type 2 diabetes mellitus with diabetic nephropathy: Secondary | ICD-10-CM | POA: Diagnosis not present

## 2016-02-28 DIAGNOSIS — E211 Secondary hyperparathyroidism, not elsewhere classified: Secondary | ICD-10-CM | POA: Diagnosis not present

## 2016-02-28 DIAGNOSIS — D631 Anemia in chronic kidney disease: Secondary | ICD-10-CM | POA: Diagnosis not present

## 2016-03-01 DIAGNOSIS — N186 End stage renal disease: Secondary | ICD-10-CM | POA: Diagnosis not present

## 2016-03-01 DIAGNOSIS — Z992 Dependence on renal dialysis: Secondary | ICD-10-CM | POA: Diagnosis not present

## 2016-03-01 DIAGNOSIS — E1121 Type 2 diabetes mellitus with diabetic nephropathy: Secondary | ICD-10-CM | POA: Diagnosis not present

## 2016-03-01 DIAGNOSIS — D631 Anemia in chronic kidney disease: Secondary | ICD-10-CM | POA: Diagnosis not present

## 2016-03-01 DIAGNOSIS — E211 Secondary hyperparathyroidism, not elsewhere classified: Secondary | ICD-10-CM | POA: Diagnosis not present

## 2016-03-01 DIAGNOSIS — N049 Nephrotic syndrome with unspecified morphologic changes: Secondary | ICD-10-CM | POA: Diagnosis not present

## 2016-03-03 DIAGNOSIS — E211 Secondary hyperparathyroidism, not elsewhere classified: Secondary | ICD-10-CM | POA: Diagnosis not present

## 2016-03-03 DIAGNOSIS — N186 End stage renal disease: Secondary | ICD-10-CM | POA: Diagnosis not present

## 2016-03-06 DIAGNOSIS — Z992 Dependence on renal dialysis: Secondary | ICD-10-CM | POA: Diagnosis not present

## 2016-03-06 DIAGNOSIS — N186 End stage renal disease: Secondary | ICD-10-CM | POA: Diagnosis not present

## 2016-03-06 DIAGNOSIS — N2581 Secondary hyperparathyroidism of renal origin: Secondary | ICD-10-CM | POA: Diagnosis not present

## 2016-03-07 DIAGNOSIS — N186 End stage renal disease: Secondary | ICD-10-CM | POA: Diagnosis not present

## 2016-03-07 DIAGNOSIS — N2581 Secondary hyperparathyroidism of renal origin: Secondary | ICD-10-CM | POA: Diagnosis not present

## 2016-03-08 DIAGNOSIS — N2581 Secondary hyperparathyroidism of renal origin: Secondary | ICD-10-CM | POA: Diagnosis not present

## 2016-03-08 DIAGNOSIS — N186 End stage renal disease: Secondary | ICD-10-CM | POA: Diagnosis not present

## 2016-03-09 ENCOUNTER — Inpatient Hospital Stay (HOSPITAL_COMMUNITY)
Admission: EM | Admit: 2016-03-09 | Discharge: 2016-03-10 | DRG: 867 | Disposition: A | Discharge status: Home / Self Care | Payer: Medicare Other | Attending: Internal Medicine | Admitting: Internal Medicine

## 2016-03-09 ENCOUNTER — Emergency Department (HOSPITAL_COMMUNITY): Payer: Medicare Other

## 2016-03-09 ENCOUNTER — Encounter (HOSPITAL_COMMUNITY): Payer: Self-pay | Admitting: Internal Medicine

## 2016-03-09 DIAGNOSIS — E1121 Type 2 diabetes mellitus with diabetic nephropathy: Secondary | ICD-10-CM | POA: Diagnosis present

## 2016-03-09 DIAGNOSIS — K3184 Gastroparesis: Secondary | ICD-10-CM | POA: Diagnosis present

## 2016-03-09 DIAGNOSIS — E1129 Type 2 diabetes mellitus with other diabetic kidney complication: Secondary | ICD-10-CM | POA: Diagnosis not present

## 2016-03-09 DIAGNOSIS — E039 Hypothyroidism, unspecified: Secondary | ICD-10-CM

## 2016-03-09 DIAGNOSIS — Z992 Dependence on renal dialysis: Secondary | ICD-10-CM

## 2016-03-09 DIAGNOSIS — K658 Other peritonitis: Secondary | ICD-10-CM | POA: Diagnosis not present

## 2016-03-09 DIAGNOSIS — Z951 Presence of aortocoronary bypass graft: Secondary | ICD-10-CM | POA: Diagnosis not present

## 2016-03-09 DIAGNOSIS — Y828 Other medical devices associated with adverse incidents: Secondary | ICD-10-CM | POA: Diagnosis not present

## 2016-03-09 DIAGNOSIS — J9811 Atelectasis: Secondary | ICD-10-CM | POA: Diagnosis present

## 2016-03-09 DIAGNOSIS — Z8051 Family history of malignant neoplasm of kidney: Secondary | ICD-10-CM

## 2016-03-09 DIAGNOSIS — M549 Dorsalgia, unspecified: Secondary | ICD-10-CM | POA: Diagnosis present

## 2016-03-09 DIAGNOSIS — Z7902 Long term (current) use of antithrombotics/antiplatelets: Secondary | ICD-10-CM

## 2016-03-09 DIAGNOSIS — Z79899 Other long term (current) drug therapy: Secondary | ICD-10-CM

## 2016-03-09 DIAGNOSIS — Y95 Nosocomial condition: Secondary | ICD-10-CM | POA: Diagnosis present

## 2016-03-09 DIAGNOSIS — R918 Other nonspecific abnormal finding of lung field: Secondary | ICD-10-CM

## 2016-03-09 DIAGNOSIS — R069 Unspecified abnormalities of breathing: Secondary | ICD-10-CM | POA: Diagnosis not present

## 2016-03-09 DIAGNOSIS — E875 Hyperkalemia: Secondary | ICD-10-CM | POA: Diagnosis not present

## 2016-03-09 DIAGNOSIS — E669 Obesity, unspecified: Secondary | ICD-10-CM

## 2016-03-09 DIAGNOSIS — E8770 Fluid overload, unspecified: Secondary | ICD-10-CM | POA: Diagnosis present

## 2016-03-09 DIAGNOSIS — I120 Hypertensive chronic kidney disease with stage 5 chronic kidney disease or end stage renal disease: Secondary | ICD-10-CM | POA: Diagnosis not present

## 2016-03-09 DIAGNOSIS — E1151 Type 2 diabetes mellitus with diabetic peripheral angiopathy without gangrene: Secondary | ICD-10-CM | POA: Diagnosis present

## 2016-03-09 DIAGNOSIS — R109 Unspecified abdominal pain: Secondary | ICD-10-CM | POA: Diagnosis present

## 2016-03-09 DIAGNOSIS — I2510 Atherosclerotic heart disease of native coronary artery without angina pectoris: Secondary | ICD-10-CM | POA: Diagnosis present

## 2016-03-09 DIAGNOSIS — E1140 Type 2 diabetes mellitus with diabetic neuropathy, unspecified: Secondary | ICD-10-CM | POA: Diagnosis present

## 2016-03-09 DIAGNOSIS — E1165 Type 2 diabetes mellitus with hyperglycemia: Secondary | ICD-10-CM | POA: Diagnosis not present

## 2016-03-09 DIAGNOSIS — T8571XA Infection and inflammatory reaction due to peritoneal dialysis catheter, initial encounter: Secondary | ICD-10-CM | POA: Diagnosis not present

## 2016-03-09 DIAGNOSIS — I10 Essential (primary) hypertension: Secondary | ICD-10-CM | POA: Diagnosis present

## 2016-03-09 DIAGNOSIS — N2581 Secondary hyperparathyroidism of renal origin: Secondary | ICD-10-CM | POA: Diagnosis present

## 2016-03-09 DIAGNOSIS — Y841 Kidney dialysis as the cause of abnormal reaction of the patient, or of later complication, without mention of misadventure at the time of the procedure: Secondary | ICD-10-CM | POA: Diagnosis present

## 2016-03-09 DIAGNOSIS — E1122 Type 2 diabetes mellitus with diabetic chronic kidney disease: Secondary | ICD-10-CM

## 2016-03-09 DIAGNOSIS — Z886 Allergy status to analgesic agent status: Secondary | ICD-10-CM

## 2016-03-09 DIAGNOSIS — N186 End stage renal disease: Secondary | ICD-10-CM

## 2016-03-09 DIAGNOSIS — J189 Pneumonia, unspecified organism: Secondary | ICD-10-CM | POA: Diagnosis not present

## 2016-03-09 DIAGNOSIS — R0602 Shortness of breath: Secondary | ICD-10-CM

## 2016-03-09 DIAGNOSIS — E8889 Other specified metabolic disorders: Secondary | ICD-10-CM | POA: Diagnosis present

## 2016-03-09 DIAGNOSIS — R1012 Left upper quadrant pain: Secondary | ICD-10-CM | POA: Diagnosis not present

## 2016-03-09 DIAGNOSIS — H353290 Exudative age-related macular degeneration, unspecified eye, stage unspecified: Secondary | ICD-10-CM | POA: Diagnosis present

## 2016-03-09 DIAGNOSIS — Z86718 Personal history of other venous thrombosis and embolism: Secondary | ICD-10-CM

## 2016-03-09 DIAGNOSIS — D631 Anemia in chronic kidney disease: Secondary | ICD-10-CM | POA: Diagnosis not present

## 2016-03-09 DIAGNOSIS — F329 Major depressive disorder, single episode, unspecified: Secondary | ICD-10-CM | POA: Diagnosis present

## 2016-03-09 DIAGNOSIS — B9689 Other specified bacterial agents as the cause of diseases classified elsewhere: Secondary | ICD-10-CM

## 2016-03-09 DIAGNOSIS — Z8249 Family history of ischemic heart disease and other diseases of the circulatory system: Secondary | ICD-10-CM

## 2016-03-09 DIAGNOSIS — IMO0002 Reserved for concepts with insufficient information to code with codable children: Secondary | ICD-10-CM | POA: Diagnosis present

## 2016-03-09 DIAGNOSIS — G8929 Other chronic pain: Secondary | ICD-10-CM

## 2016-03-09 DIAGNOSIS — D649 Anemia, unspecified: Secondary | ICD-10-CM | POA: Diagnosis present

## 2016-03-09 DIAGNOSIS — Z7982 Long term (current) use of aspirin: Secondary | ICD-10-CM

## 2016-03-09 DIAGNOSIS — E1143 Type 2 diabetes mellitus with diabetic autonomic (poly)neuropathy: Secondary | ICD-10-CM

## 2016-03-09 DIAGNOSIS — T8029XA Infection following other infusion, transfusion and therapeutic injection, initial encounter: Principal | ICD-10-CM | POA: Diagnosis present

## 2016-03-09 DIAGNOSIS — I252 Old myocardial infarction: Secondary | ICD-10-CM

## 2016-03-09 DIAGNOSIS — J45909 Unspecified asthma, uncomplicated: Secondary | ICD-10-CM | POA: Diagnosis present

## 2016-03-09 DIAGNOSIS — R1011 Right upper quadrant pain: Secondary | ICD-10-CM | POA: Diagnosis not present

## 2016-03-09 DIAGNOSIS — Z794 Long term (current) use of insulin: Secondary | ICD-10-CM | POA: Diagnosis not present

## 2016-03-09 DIAGNOSIS — Z806 Family history of leukemia: Secondary | ICD-10-CM

## 2016-03-09 DIAGNOSIS — E1142 Type 2 diabetes mellitus with diabetic polyneuropathy: Secondary | ICD-10-CM

## 2016-03-09 DIAGNOSIS — E876 Hypokalemia: Secondary | ICD-10-CM | POA: Diagnosis present

## 2016-03-09 DIAGNOSIS — Z79891 Long term (current) use of opiate analgesic: Secondary | ICD-10-CM

## 2016-03-09 LAB — CBC WITH DIFFERENTIAL/PLATELET
BASOS ABS: 0 10*3/uL (ref 0.0–0.1)
Basophils Relative: 0 %
EOS ABS: 0.4 10*3/uL (ref 0.0–0.7)
EOS PCT: 3 %
HCT: 29.9 % — ABNORMAL LOW (ref 39.0–52.0)
Hemoglobin: 9.7 g/dL — ABNORMAL LOW (ref 13.0–17.0)
LYMPHS PCT: 8 %
Lymphs Abs: 1 10*3/uL (ref 0.7–4.0)
MCH: 27 pg (ref 26.0–34.0)
MCHC: 32.4 g/dL (ref 30.0–36.0)
MCV: 83.3 fL (ref 78.0–100.0)
MONO ABS: 0.7 10*3/uL (ref 0.1–1.0)
Monocytes Relative: 6 %
Neutro Abs: 9.9 10*3/uL — ABNORMAL HIGH (ref 1.7–7.7)
Neutrophils Relative %: 83 %
PLATELETS: 278 10*3/uL (ref 150–400)
RBC: 3.59 MIL/uL — AB (ref 4.22–5.81)
RDW: 17.6 % — AB (ref 11.5–15.5)
WBC: 12 10*3/uL — AB (ref 4.0–10.5)

## 2016-03-09 LAB — COMPREHENSIVE METABOLIC PANEL
ALT: 13 U/L — AB (ref 17–63)
AST: 14 U/L — AB (ref 15–41)
Albumin: 3.6 g/dL (ref 3.5–5.0)
Alkaline Phosphatase: 118 U/L (ref 38–126)
Anion gap: 16 — ABNORMAL HIGH (ref 5–15)
BILIRUBIN TOTAL: 0.6 mg/dL (ref 0.3–1.2)
BUN: 98 mg/dL — AB (ref 6–20)
CALCIUM: 8 mg/dL — AB (ref 8.9–10.3)
CHLORIDE: 95 mmol/L — AB (ref 101–111)
CO2: 18 mmol/L — ABNORMAL LOW (ref 22–32)
CREATININE: 11.4 mg/dL — AB (ref 0.61–1.24)
GFR, EST AFRICAN AMERICAN: 5 mL/min — AB (ref >60)
GFR, EST NON AFRICAN AMERICAN: 5 mL/min — AB (ref >60)
Glucose, Bld: 394 mg/dL — ABNORMAL HIGH (ref 65–99)
Potassium: 6.6 mmol/L (ref 3.5–5.1)
Sodium: 129 mmol/L — ABNORMAL LOW (ref 135–145)
TOTAL PROTEIN: 7.2 g/dL (ref 6.5–8.1)

## 2016-03-09 LAB — PROTIME-INR
INR: 1.14
Prothrombin Time: 14.6 seconds (ref 11.4–15.2)

## 2016-03-09 LAB — I-STAT TROPONIN, ED: TROPONIN I, POC: 0 ng/mL (ref 0.00–0.08)

## 2016-03-09 LAB — LIPASE, BLOOD: LIPASE: 33 U/L (ref 11–51)

## 2016-03-09 LAB — CBG MONITORING, ED: GLUCOSE-CAPILLARY: 365 mg/dL — AB (ref 65–99)

## 2016-03-09 LAB — GLUCOSE, CAPILLARY
GLUCOSE-CAPILLARY: 362 mg/dL — AB (ref 65–99)
Glucose-Capillary: 347 mg/dL — ABNORMAL HIGH (ref 65–99)

## 2016-03-09 LAB — AMMONIA: Ammonia: 23 umol/L (ref 9–35)

## 2016-03-09 LAB — I-STAT CG4 LACTIC ACID, ED: LACTIC ACID, VENOUS: 1.67 mmol/L (ref 0.5–1.9)

## 2016-03-09 MED ORDER — GABAPENTIN 100 MG PO CAPS
200.0000 mg | ORAL_CAPSULE | Freq: Four times a day (QID) | ORAL | Status: DC
  Administered 2016-03-09 – 2016-03-10 (×3): 200 mg via ORAL
  Filled 2016-03-09 (×4): qty 2

## 2016-03-09 MED ORDER — CITALOPRAM HYDROBROMIDE 10 MG PO TABS
60.0000 mg | ORAL_TABLET | Freq: Every day | ORAL | Status: DC
  Administered 2016-03-10: 60 mg via ORAL
  Filled 2016-03-09: qty 6

## 2016-03-09 MED ORDER — CEFEPIME HCL 2 G IJ SOLR: 2.0000 g | Freq: Once | INTRAMUSCULAR | Status: DC

## 2016-03-09 MED ORDER — CALCIUM ACETATE (PHOS BINDER) 667 MG PO CAPS
1334.0000 mg | ORAL_CAPSULE | Freq: Three times a day (TID) | ORAL | Status: DC
  Administered 2016-03-10 (×2): 1334 mg via ORAL
  Filled 2016-03-09 (×2): qty 2

## 2016-03-09 MED ORDER — IPRATROPIUM-ALBUTEROL 18-103 MCG/ACT IN AERO: 2.0000 | INHALATION_SPRAY | RESPIRATORY_TRACT | Status: DC

## 2016-03-09 MED ORDER — HYDROMORPHONE HCL 2 MG/ML IJ SOLN
0.5000 mg | Freq: Once | INTRAMUSCULAR | Status: AC
  Administered 2016-03-09: 0.5 mg via INTRAVENOUS
  Filled 2016-03-09: qty 1

## 2016-03-09 MED ORDER — IPRATROPIUM-ALBUTEROL 0.5-2.5 (3) MG/3ML IN SOLN
3.0000 mL | Freq: Once | RESPIRATORY_TRACT | Status: AC
  Administered 2016-03-09: 3 mL via RESPIRATORY_TRACT
  Filled 2016-03-09: qty 3

## 2016-03-09 MED ORDER — IPRATROPIUM-ALBUTEROL 0.5-2.5 (3) MG/3ML IN SOLN
3.0000 mL | RESPIRATORY_TRACT | Status: DC | PRN
Start: 2016-03-09 — End: 2016-03-11

## 2016-03-09 MED ORDER — ASPIRIN EC 81 MG PO TBEC
81.0000 mg | DELAYED_RELEASE_TABLET | Freq: Every day | ORAL | Status: DC
Start: 2016-03-09 — End: 2016-03-11
  Administered 2016-03-09 – 2016-03-10 (×2): 81 mg via ORAL
  Filled 2016-03-09 (×3): qty 1

## 2016-03-09 MED ORDER — INSULIN ASPART 100 UNIT/ML ~~LOC~~ SOLN
0.0000 [IU] | Freq: Three times a day (TID) | SUBCUTANEOUS | Status: DC
  Administered 2016-03-09: 15 [IU] via SUBCUTANEOUS
  Administered 2016-03-10: 20 [IU] via SUBCUTANEOUS

## 2016-03-09 MED ORDER — PANTOPRAZOLE SODIUM 40 MG PO TBEC
40.0000 mg | DELAYED_RELEASE_TABLET | Freq: Every day | ORAL | Status: DC
  Administered 2016-03-09 – 2016-03-10 (×2): 40 mg via ORAL
  Filled 2016-03-09 (×2): qty 1

## 2016-03-09 MED ORDER — INSULIN ASPART 100 UNIT/ML ~~LOC~~ SOLN
0.0000 [IU] | Freq: Every day | SUBCUTANEOUS | Status: DC
  Administered 2016-03-10: 5 [IU] via SUBCUTANEOUS

## 2016-03-09 MED ORDER — METOCLOPRAMIDE HCL 5 MG PO TABS: 5.0000 mg | ORAL_TABLET | Freq: Three times a day (TID) | ORAL | Status: DC | PRN

## 2016-03-09 MED ORDER — CLOPIDOGREL BISULFATE 75 MG PO TABS
75.0000 mg | ORAL_TABLET | Freq: Every day | ORAL | Status: DC
  Administered 2016-03-09 – 2016-03-10 (×2): 75 mg via ORAL
  Filled 2016-03-09 (×2): qty 1

## 2016-03-09 MED ORDER — VANCOMYCIN HCL 10 G IV SOLR
2500.0000 mg | Freq: Once | INTRAVENOUS | Status: DC
  Filled 2016-03-09: qty 2500

## 2016-03-09 MED ORDER — LEVOTHYROXINE SODIUM 50 MCG PO TABS
50.0000 ug | ORAL_TABLET | Freq: Every day | ORAL | Status: DC
  Administered 2016-03-10: 50 ug via ORAL
  Filled 2016-03-09: qty 1

## 2016-03-09 MED ORDER — ATORVASTATIN CALCIUM 80 MG PO TABS
80.0000 mg | ORAL_TABLET | Freq: Every day | ORAL | Status: DC
  Administered 2016-03-09: 80 mg via ORAL
  Filled 2016-03-09: qty 1

## 2016-03-09 MED ORDER — INSULIN GLARGINE 100 UNIT/ML ~~LOC~~ SOLN
20.0000 [IU] | Freq: Two times a day (BID) | SUBCUTANEOUS | Status: DC
  Administered 2016-03-09 – 2016-03-10 (×2): 20 [IU] via SUBCUTANEOUS
  Filled 2016-03-09 (×3): qty 0.2

## 2016-03-09 MED ORDER — HYDROCODONE-ACETAMINOPHEN 5-325 MG PO TABS: 1.0000 | ORAL_TABLET | ORAL | Status: DC | PRN

## 2016-03-09 MED ORDER — CALCITRIOL 0.25 MCG PO CAPS
0.2500 ug | ORAL_CAPSULE | ORAL | Status: DC
  Administered 2016-03-10: 0.25 ug via ORAL

## 2016-03-09 MED ORDER — DEXTROSE 5 % IV SOLN
1.0000 g | INTRAVENOUS | Status: DC
  Filled 2016-03-09: qty 1

## 2016-03-09 MED ORDER — VANCOMYCIN HCL IN DEXTROSE 1-5 GM/200ML-% IV SOLN: 1000.0000 mg | Freq: Once | INTRAVENOUS | Status: DC

## 2016-03-09 MED ORDER — METOPROLOL TARTRATE 12.5 MG HALF TABLET
12.5000 mg | ORAL_TABLET | Freq: Two times a day (BID) | ORAL | Status: DC
  Administered 2016-03-09 – 2016-03-10 (×2): 12.5 mg via ORAL
  Filled 2016-03-09 (×3): qty 1

## 2016-03-09 MED ORDER — HEPARIN SODIUM (PORCINE) 5000 UNIT/ML IJ SOLN
5000.0000 [IU] | Freq: Three times a day (TID) | INTRAMUSCULAR | Status: DC
  Administered 2016-03-09 – 2016-03-10 (×3): 5000 [IU] via SUBCUTANEOUS
  Filled 2016-03-09 (×3): qty 1

## 2016-03-09 NOTE — ED Notes (Signed)
Pt returned from x-ray. Requesting ice, informed pt and family member that we should wait until after CT scan and blood work return before giving ice.

## 2016-03-09 NOTE — ED Notes (Signed)
Patient transported to CT 

## 2016-03-09 NOTE — H&P (Signed)
Date: 03/09/2016               Patient Name:  Russell [name] MRN: [id number]  DOB: 1970/05/23 Age / Sex: 46 y.o., male   PCP: [name], MD         Medical Service: Internal Medicine Teaching Service         Attending Physician: Dr. Gust Rung, DO    First Contact: Dr. Vincente Liberty Pager: [phone]  Second Contact: Dr. 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 Reports his abdominal pain started this morning around 2 AM. Pain is located on the right side in the mid-lower portion. Describes pain as 10/10 in severity at its worse, constant but intermittent in intensity, sharp, and non-radiating. He reports associated nausea and 3 loose stools over the last 1-2 days. Denies any recent antibiotic use. Denies any fevers, vomiting, decreased appetite, or blood in the stool. He denies pain around his left- PD catheter site. He had his PD catheter placed on Feb 6th and is currently training at an outpatient facility to use home PD. He does have a left upper forearm AVF that was being used while on HD. He is also reporting shortness of breath that started this morning. He notes associated wheezing, but denies cough or chest pain. He reports left back pain near the flank that has been ongoing for the last 2-3 days. He states the back pain is worse with deep inspiration. He reports feeling confused for the last several days as well, not remembering certain events and difficulty finding his words at times. His mother and fiancee were present in the room and feel his confusion could be related to all of the medications he is  taking.   In the ED, he was found to have a left lower lobe infiltrate on CXR and given Vancomycin and Cefepime. His K was incidentally found to be elevated at 6.6. No associated EKG changes. Nephrology has been consulted for dialysis.   Meds:  Albuterol inhaler Q6H PRN ASA 81 mg daily Klonopin 2 mg QHS Flexeril 10 mg QHS Dilaudid 2 mg Q8H PRN Hydroxyzine 25 mg BID PRN Lantus 30 units BID Humalog 15-30 units TID with meals Lopressor 50 mg BID Miralax 17 g daily PRN Phenergan 25 mg Q6H PRN Combivent 2 puffs Q4H Lipitor 80 mg daily Calcitriol 0.25 mcg QMon/Wed/Fri with HD Phoslo 1,334 mg TID with meals Celexa 60 mg QHS Plavix 75 mg daily Gabapentin 200 mg 4 times daily Norco 5-325 mg 2 tablets Q4H PRN Synthroid 50 mcg daily Reglan 5 mg Q8H PRN Prilosec 40 mg BID    Allergies: Allergies as of 03/09/2016 - Review Complete 01/27/2016  Allergen Reaction Noted  . Ibuprofen Other (See Comments) 08/04/2014  . Nsaids Other (See Comments) 02/13/2015   Past Medical History:  Diagnosis Date  . Age-related macular degeneration, wet, both eyes (HCC)    "I'm getting Alia treatments" (12/30/2014)  . Anxiety   . Asthma   . 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 Daily headache   . Depression   . DVT (deep venous thrombosis), H/o 01/2014-on Xarelto 03/24/2014   LLE  . Hypertension   . Hypothyroidism   . Nephrotic syndrome 05/18/2014  . NSTEMI (non-ST elevated myocardial infarction) (HCC)   . Pneumonia 11/2013  . Renal insufficiency   . Secondary DM with DKA-AG=16, BIcarb Nl 03/24/2014  . Type 2 diabetes mellitus with diabetic nephropathy (HCC)     Family History: Father- AML. MGM- kidney cancer. Maternal uncle- CAD.   Social History: Lives with fiancee at home. Never smoker. Denies  alcohol or illicit drug use.    Review of Systems: A complete ROS was negative except as per HPI.   Physical Exam: Blood pressure 119/72, pulse 85, temperature 98.5 F (36.9 C), temperature source Oral, resp. rate 15, SpO2 95 %. General: Obese man sitting up in chair, NAD HEENT: [place]/AT, EOMI, PERRL, sclera anicteric, mucus membranes dry CV: RRR, no m/g/r Pulm: Mild crackles at bases, minimal end-expiratory wheezes  Abd: BS+, soft, obese, tenderness elicited in the mid to lower right abdomen, no rebound or guarding, left-sided PD catheter has mild erythema around the insertion site and minimal tenderness to palpation, no drainage. Some tenderness to palpation of the left flank.  Ext: 1+ peripheral edema bilaterally Neuro: alert and oriented x 3. Slow in speech and has difficulty coming up with answers at times.   EKG: Sinus rhythm. Old anterior infarct.  CXR: Questionable left lower lobe infiltrate. Looking back at old x-rays this has been persistent, more likely atelectasis.  CT Abd/Pelvis: IMPRESSION: Few small foci of free air in the upper abdomen likely related to the patient's known dialysis catheter.  No acute intra-abdominal abnormality is seen.  Assessment & Plan by Problem:  Abdominal Pain: Patient presenting with a 1 day hx of right-sided abdominal pain associated with nausea and loose stools. Exam with only mild tenderness on the right side in the mid-lower abdomen. No peritoneal signs. CT abd/pelvis unrevealing for acute cause. He had a PD catheter placed on 02/08/16 but this is located on the left side of the abdomen. There is some mild erythema and tenderness but does not appear overtly infected. Renal plans to obtain some peritoneal fluid from the PD cath to rule out infection. Agree with this plan. Patient still makes urine so will check a UA as he is having left flank pain too. If having frequent BMs in the hospital can consider checking C diff but will hold off right  now. - f/u peritoneal culture, cell count - f/u UA - f/u blood cultures - Hold off on antibiotics  Hyperkalemia in setting of ESRD: K 6.6 on admission. No EKG changes present. Patient likely underdialyzed due to transitioning to new PD. Nephrology has been consulted and plan to do HD later today. His confusion is likely related to his uremia.  - bmet in AM - HD schedule per Renal   Shortness of breath: A left lower lobe infiltrate was called on the CXR but this seems to be a chronic finding. Appears more to be atelectasis. Patient reports SOB but this is likely to be more related to volume overload from being underdialyzed. Denies productive cough, fevers.  He has not received antibiotics yet, but Vanc/Cefepime ordered by ED. Will hold off on antibiotics at this time. He is  without fever and WBC count minimally elevated at 12. He does have a hx of asthma so will continue nebs as needed. - Hold off on antibiotics  - Trend WBC count and fever curves  - Duonebs Q4H PRN   CAD s/p CABG: Prolonged hospitalization in Sept-Oct 2017 for NSTEMI with subsequent CABG. Denies any chest pain at this time. EKG without any acute changes. - Continue home ASA and Plavix - Continue home Lopressor  Uncontrolled Type 2 DM: Last A1c 12.5 in Sept 2017. He is on Lantus 30 units BID and Humalog 10-15 units TID with meals. Also has peripheral neuropathy for which he takes Gabapentin 200 mg 4 times daily. He is also on Reglan for gastroparesis. - Decrease Lantus to 20 units BID, can titrate up as needed - Resistant SSI - Check CBGs 4 times daily - Continue home Gabapentin and Reglan   HTN: BP stable in 130s-140s systolic. - Continue home Lopressor   Hypothyroidism:  - Continue home Levothyroxine 50 mcg daily  Chronic Back Pain: On Dilaudid, Norco, and Flexeril at home for chronic back pain. Per family, he is likely overmedicated as he is often sedated and lethargic. Some confusion today but is in setting of  possible infection.  - Continue Norco 5-325 mg 1-2 tabs Q4H PRN - Hold Dilaudid and Flexeril - Would avoid other sedating medications at this time     Diet: Renal/Carb mod with fluid restriction  DVT PPx: Heparin SQ Dispo: Admit patient to Inpatient with expected length of stay greater than 2 midnights.  Signed: Su Hoff, MD 03/09/2016, 11:16 AM  Pager: [phone]

## 2016-03-09 NOTE — Consult Note (Signed)
[name] Renal Consultation Note    Indication for Consultation:  Management of ESRD/hemodialysis, anemia, hypertension/volume, and secondary hyperparathyroidism. PCP:  HPI: Russell [name] is a 46 y.o. male with ESRD, Type 2 DM, CAD, asthma, hypothyroidism who is being admitted with hyperkalemia and possible pneumonia.   He came to the ED overnight with upper abdominal pain and dyspnea. No fever, but chills. No chest pain, but "heavy sensation" and feels fluid overloaded. He had L flank pains earlier this week, but that has resolved. In the ED, found to have K 6.6 and WBC 12. CT abdomen essentially negative. CXR showed LLL infiltrate, ?pneumonia (this area not noted on CT). He cannot describe whether pain is worse with PD since it is so new.  Previously on HD at [facility], just transitioned to PD on Monday. Has been having CAPD in-center training this week. On Monday, he was already having facial swelling due to volume excess. During his few sessions of PD, he was able to remove several liters of fluid, but he feels like he is still very edematous. Admits to eating his K foods such as bananas and potatoes.  Past Medical History:  Diagnosis Date  . Age-related macular degeneration, wet, both eyes (HCC)    "I'm getting Alia treatments" (12/30/2014)  . Anxiety   . Asthma   . 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 Daily headache   . Depression   . DVT (deep venous thrombosis), H/o 01/2014-on Xarelto 03/24/2014   LLE  . Hypertension   . Hypothyroidism   . Nephrotic syndrome 05/18/2014  . NSTEMI (non-ST elevated myocardial infarction) (HCC)   . Pneumonia 11/2013  . Renal insufficiency   . Secondary DM with DKA-AG=16, BIcarb Nl 03/24/2014  . Type 2 diabetes mellitus with diabetic  nephropathy [facility])    Past Surgical History:  Procedure Laterality Date  . ANKLE FRACTURE SURGERY Right 1988  . AV FISTULA PLACEMENT Left 01/01/2015   Procedure: CREATION OF LEFT RADIAL CEPHALIC ARTERIOVENOUS (AV) FISTULA ;  Surgeon: Pryor Ochoa, MD;  Location: [facility] OR;  Service: Vascular;  Laterality: Left;  . CARDIAC CATHETERIZATION N/A 09/22/2015   Procedure: Left Heart Cath and Coronary Angiography;  Surgeon: Iran Ouch, MD;  Location: [facility] INVASIVE CV LAB;  Service: Cardiovascular;  Laterality: N/A;  . CORONARY ARTERY BYPASS GRAFT N/A 09/28/2015   Procedure: CORONARY ARTERY BYPASS GRAFTING (CABG) x 3 UTILIZING LEFT MAMMARY ARTERY AND ENDOSCOPICALLY HARVESTED LEFT GREATER SAPHENOUS VEIN.;  Surgeon: Delight Ovens, MD;  Location: [facility] OR;  Service: Open Heart Surgery;  Laterality: N/A;  . ENDOVEIN HARVEST OF GREATER SAPHENOUS VEIN Left 09/28/2015   Procedure: ENDOVEIN HARVEST OF GREATER SAPHENOUS VEIN;  Surgeon: Delight Ovens, MD;  Location: [facility] OR;  Service: Open Heart Surgery;  Laterality: Left;  . ESOPHAGOGASTRODUODENOSCOPY N/A 03/29/2014   Procedure: ESOPHAGOGASTRODUODENOSCOPY (EGD);  Surgeon: Dorena Cookey, MD;  Location: [facility];  Service: Endoscopy;  Laterality: N/A;  . EYE SURGERY    . FRACTURE SURGERY    . INSERTION OF DIALYSIS CATHETER Right 01/05/2015   Procedure: INSERTION OF RIGHT INTERNAL JUGULAR DIALYSIS CATHETER;  Surgeon: Fransisco Hertz, MD;  Location: [name] OR;  Service: Vascular;  Laterality: Right;  . TEE WITHOUT CARDIOVERSION N/A 09/28/2015   Procedure: TRANSESOPHAGEAL ECHOCARDIOGRAM (TEE);  Surgeon: Delight Ovens, MD;  Location: [facility] OR;  Service: Open Heart Surgery;  Laterality: N/A;  . TONSILLECTOMY AND ADENOIDECTOMY  1970s   Family History  Problem Relation Age of Onset  . Obesity Mother     Patient states that family members have no other medical illnesses other than what I have described  . Kidney cancer Maternal Grandmother   . Cancer Father 50    AML  .  Heart disease      No family history   Social History:  reports that he has never smoked. He has never used smokeless tobacco. He reports that he drinks alcohol. He reports that he does not use drugs.  ROS: As per HPI otherwise negative.  Physical Exam: Vitals:   03/09/16 0815 03/09/16 1006 03/09/16 1015 03/09/16 1030  BP: 131/98 (!) 141/123 136/70 119/72  Pulse: 73 86 85 85  Resp: 15 19 22 15   Temp:      TempSrc:      SpO2: 93% 97% 92% 95%     General: Well developed, well nourished, in no acute distress, + facial edema. Head: Normocephalic, atraumatic, sclera non-icteric, mucus membranes are moist. Neck: Supple without lymphadenopathy/masses. Lungs: Clear bilaterally to auscultation without wheezes, rales, or rhonchi. Breathing is unlabored. Heart: RRR with normal S1, S2. No murmurs, rubs, or gallops appreciated. Abdomen: Soft,mild generalized tenderness without guarding. PD cath in L mid abdomen without drainage, mildly erythematous exit site. Musculoskeletal:  Strength and tone appear normal for age. Lower extremities: 1+ LE edema Neuro: Alert and oriented X 3. Moves all extremities spontaneously. Psych:  Responds to questions appropriately with a normal affect. Dialysis Access: PD cath and L forearm AVF + thrill/bruit.  Allergies  Allergen Reactions  . Ibuprofen Other (See Comments)    MD told him not to take due to kidney disease.  . Nsaids Other (See Comments)    Told to avoid all nsaids due to kidney disease-takes acetaminophen as needed   Prior to Admission medications   Medication Sig Start Date End Date Taking? Authorizing Provider  acetaminophen (TYLENOL) 500 MG tablet Take 1 tablet (500 mg total) by mouth every 6 (six) hours as needed for moderate pain. Patient taking differently: Take 1,000 mg by mouth every 6 (six) hours as needed for moderate pain.  09/29/14   Albertine Grates, MD  albuterol (PROVENTIL HFA;VENTOLIN HFA) 108 (90 Base) MCG/ACT inhaler Inhale 1 puff into  the lungs every 6 (six) hours as needed for wheezing or shortness of breath. 03/11/15   [name], MD  albuterol-ipratropium (COMBIVENT) 18-103 MCG/ACT inhaler Inhale 2 puffs into the lungs every 4 (four) hours. Patient taking differently: Inhale 2 puffs into the lungs every 6 (six) hours as needed for wheezing or shortness of breath.  09/03/14   [name], MD  amoxicillin-clavulanate (AUGMENTIN) 875-125 MG tablet Take 1 tablet by mouth 2 (two) times daily. 01/20/16   Deatra Canter, FNP  aspirin EC 81 MG EC tablet Take 1 tablet (81 mg total) by mouth daily. Patient taking differently: Take 81 mg by mouth every morning.  10/06/15   Erin R Barrett, PA-C  atorvastatin (LIPITOR) 80 MG tablet Take 1 tablet (80 mg total) by mouth daily at 6 PM. 01/27/16   Lewayne Bunting, MD  calcitRIOL (ROCALTROL) 0.25 MCG capsule Take 1 capsule (0.25 mcg total) by mouth every Monday, Wednesday, and Friday with hemodialysis. 10/06/15   Erin R Barrett, PA-C  calcium acetate (PHOSLO) 667 MG capsule Take 2 capsules (1,334  mg total) by mouth 3 (three) times daily with meals. 01/08/15   Rolly Salter, MD  citalopram (CELEXA) 40 MG tablet Take 1.5 tablets (60 mg total) by mouth at bedtime. 05/22/15   [name], MD  clonazePAM (KLONOPIN) 2 MG tablet Take 2 mg by mouth at bedtime.    Historical Provider, MD  clopidogrel (PLAVIX) 75 MG tablet Take 1 tablet (75 mg total) by mouth daily. 01/27/16   Lewayne Bunting, MD  cyclobenzaprine (FLEXERIL) 10 MG tablet Take 1 tablet (10 mg total) by mouth at bedtime. 05/22/15   [name], MD  gabapentin (NEURONTIN) 100 MG capsule Take 2 capsules (200 mg total) by mouth 3 (three) times daily. Patient taking differently: Take 200 mg by mouth 4 (four) times daily.  05/22/15   [name], MD  HYDROcodone-acetaminophen (NORCO/VICODIN) 5-325 MG tablet Take 2 tablets by mouth every 4 (four) hours as needed. 01/20/16   Deatra Canter, FNP  HYDROmorphone (DILAUDID) 2 MG tablet  Take 1 tablet (2 mg total) by mouth every 8 (eight) hours as needed for severe pain. Patient taking differently: Take 2-4 mg by mouth every 8 (eight) hours as needed for severe pain.  05/22/15   [name], MD  hydrOXYzine (ATARAX/VISTARIL) 25 MG tablet Take 25 mg by mouth 2 (two) times daily as needed for anxiety.     Historical Provider, MD  insulin glargine (LANTUS) 100 UNIT/ML injection Inject 30 Units into the skin 2 (two) times daily.     Historical Provider, MD  insulin lispro (HUMALOG) 100 UNIT/ML injection Inject 0.15-0.3 mLs (15-30 Units total) into the skin every evening. Take one unit per 5 carbs depending on dinner. Patient taking differently: Inject 15-30 Units into the skin 3 (three) times daily with meals. Take one unit per 5 carbs depending on dinner. 11/29/15   Carlus Pavlov, MD  levothyroxine (SYNTHROID, LEVOTHROID) 50 MCG tablet Take 1 tablet (50 mcg total) by mouth daily. 05/22/15   [name], MD  metoCLOPramide (REGLAN) 5 MG tablet Take 1 tablet (5 mg total) by mouth every 8 (eight) hours as needed for nausea or vomiting. 01/10/16   Dwana Melena, DO  metoprolol tartrate (LOPRESSOR) 25 MG tablet Take 2 tablets (50 mg total) by mouth 2 (two) times daily. Take 2 tablets ( 50 mg ) twice a day 01/27/16   Lewayne Bunting, MD  omeprazole (PRILOSEC) 40 MG capsule Take 1 capsule (40 mg total) by mouth 2 (two) times daily. 07/20/15   Srikar Sudini, MD  polyethylene glycol (MIRALAX) packet Take 17 g by mouth daily. Patient taking differently: Take 17 g by mouth daily as needed for mild constipation.  07/20/15   Milagros Loll, MD  promethazine (PHENERGAN) 25 MG tablet Take 1 tablet (25 mg total) by mouth every 6 (six) hours as needed for nausea or vomiting. 01/08/16   Kristen N Ward, DO   Current Facility-Administered Medications  Medication Dose Route Frequency Provider Last Rate Last Dose  . ceFEPIme (MAXIPIME) 1 g in dextrose 5 % 50 mL IVPB  1 g Intravenous Q24H Almon Hercules, [facility]       . HYDROcodone-acetaminophen (NORCO/VICODIN) 5-325 MG per tablet 1-2 tablet  1-2 tablet Oral Q4H PRN Carly J Rivet, MD      . insulin aspart (novoLOG) injection 0-20 Units  0-20 Units Subcutaneous TID WC Carly J Rivet, MD      . insulin aspart (novoLOG) injection 0-5 Units  0-5 Units Subcutaneous QHS Su Hoff, MD      .  insulin glargine (LANTUS) injection 20 Units  20 Units Subcutaneous BID Carly J Rivet, MD      . ipratropium-albuterol (DUONEB) 0.5-2.5 (3) MG/3ML nebulizer solution 3 mL  3 mL Nebulization Q4H PRN Carly J Rivet, MD      . vancomycin (VANCOCIN) 2,500 mg in sodium chloride 0.9 % 500 mL IVPB  2,500 mg Intravenous Once Almon HerculesHaley P Baird, [facility] Center[facility]PH       Current Outpatient Prescriptions  Medication Sig Dispense Refill  . acetaminophen (TYLENOL) 500 MG tablet Take 1 tablet (500 mg total) by mouth every 6 (six) hours as needed for moderate pain. (Patient taking differently: Take 1,000 mg by mouth every 6 (six) hours as needed for moderate pain. ) 30 tablet 0  . albuterol (PROVENTIL HFA;VENTOLIN HFA) 108 (90 Base) MCG/ACT inhaler Inhale 1 puff into the lungs every 6 (six) hours as needed for wheezing or shortness of breath. 1 Inhaler 11  . albuterol-ipratropium (COMBIVENT) 18-103 MCG/ACT inhaler Inhale 2 puffs into the lungs every 4 (four) hours. (Patient taking differently: Inhale 2 puffs into the lungs every 6 (six) hours as needed for wheezing or shortness of breath. ) 1 Inhaler 11  . amoxicillin-clavulanate (AUGMENTIN) 875-125 MG tablet Take 1 tablet by mouth 2 (two) times daily. 20 tablet 0  . aspirin EC 81 MG EC tablet Take 1 tablet (81 mg total) by mouth daily. (Patient taking differently: Take 81 mg by mouth every morning. )    . atorvastatin (LIPITOR) 80 MG tablet Take 1 tablet (80 mg total) by mouth daily at 6 PM. 90 tablet 3  . calcitRIOL (ROCALTROL) 0.25 MCG capsule Take 1 capsule (0.25 mcg total) by mouth every Monday, Wednesday, and Friday with hemodialysis. 30 capsule 3  .  calcium acetate (PHOSLO) 667 MG capsule Take 2 capsules (1,334 mg total) by mouth 3 (three) times daily with meals. 180 capsule 0  . citalopram (CELEXA) 40 MG tablet Take 1.5 tablets (60 mg total) by mouth at bedtime. 90 tablet 3  . clonazePAM (KLONOPIN) 2 MG tablet Take 2 mg by mouth at bedtime.    . clopidogrel (PLAVIX) 75 MG tablet Take 1 tablet (75 mg total) by mouth daily. 90 tablet 3  . cyclobenzaprine (FLEXERIL) 10 MG tablet Take 1 tablet (10 mg total) by mouth at bedtime. 30 tablet 11  . gabapentin (NEURONTIN) 100 MG capsule Take 2 capsules (200 mg total) by mouth 3 (three) times daily. (Patient taking differently: Take 200 mg by mouth 4 (four) times daily. ) 180 capsule 11  . HYDROcodone-acetaminophen (NORCO/VICODIN) 5-325 MG tablet Take 2 tablets by mouth every 4 (four) hours as needed. 6 tablet 0  . HYDROmorphone (DILAUDID) 2 MG tablet Take 1 tablet (2 mg total) by mouth every 8 (eight) hours as needed for severe pain. (Patient taking differently: Take 2-4 mg by mouth every 8 (eight) hours as needed for severe pain. ) 90 tablet 0  . hydrOXYzine (ATARAX/VISTARIL) 25 MG tablet Take 25 mg by mouth 2 (two) times daily as needed for anxiety.     . insulin glargine (LANTUS) 100 UNIT/ML injection Inject 30 Units into the skin 2 (two) times daily.     . insulin lispro (HUMALOG) 100 UNIT/ML injection Inject 0.15-0.3 mLs (15-30 Units total) into the skin every evening. Take one unit per 5 carbs depending on dinner. (Patient taking differently: Inject 15-30 Units into the skin 3 (three) times daily with meals. Take one unit per 5 carbs depending on dinner.) 10 mL 2  . levothyroxine (  SYNTHROID, LEVOTHROID) 50 MCG tablet Take 1 tablet (50 mcg total) by mouth daily. 90 tablet 3  . metoCLOPramide (REGLAN) 5 MG tablet Take 1 tablet (5 mg total) by mouth every 8 (eight) hours as needed for nausea or vomiting. 30 tablet 0  . metoprolol tartrate (LOPRESSOR) 25 MG tablet Take 2 tablets (50 mg total) by mouth 2  (two) times daily. Take 2 tablets ( 50 mg ) twice a day 360 tablet 3  . omeprazole (PRILOSEC) 40 MG capsule Take 1 capsule (40 mg total) by mouth 2 (two) times daily. 60 capsule 0  . polyethylene glycol (MIRALAX) packet Take 17 g by mouth daily. (Patient taking differently: Take 17 g by mouth daily as needed for mild constipation. ) 14 each 0  . promethazine (PHENERGAN) 25 MG tablet Take 1 tablet (25 mg total) by mouth every 6 (six) hours as needed for nausea or vomiting. 15 tablet 0   Labs: Basic Metabolic Panel:  Recent Labs Lab 03/09/16 0726  NA 129*  K 6.6*  CL 95*  CO2 18*  GLUCOSE 394*  BUN 98*  CREATININE 11.40*  CALCIUM 8.0*   Liver Function Tests:  Recent Labs Lab 03/09/16 0726  AST 14*  ALT 13*  ALKPHOS 118  BILITOT 0.6  PROT 7.2  ALBUMIN 3.6    Recent Labs Lab 03/09/16 0726  LIPASE 33    Recent Labs Lab 03/09/16 0726  AMMONIA 23   CBC:  Recent Labs Lab 03/09/16 0726  WBC 12.0*  NEUTROABS 9.9*  HGB 9.7*  HCT 29.9*  MCV 83.3  PLT 278   CBG:  Recent Labs Lab 03/09/16 0703  GLUCAP 365*   Studies/Results: Ct Abdomen Pelvis Wo Contrast  Result Date: 03/09/2016 CLINICAL DATA:  Severe right upper quadrant pain EXAM: CT ABDOMEN AND PELVIS WITHOUT CONTRAST TECHNIQUE: Multidetector CT imaging of the abdomen and pelvis was performed following the standard protocol without IV contrast. COMPARISON:  None. FINDINGS: Lower chest: Mild bibasilar atelectatic changes are noted. No sizable effusion is seen. Hepatobiliary: No focal liver abnormality is seen. No gallstones, gallbladder wall thickening, or biliary dilatation. Pancreas: Unremarkable. No pancreatic ductal dilatation or surrounding inflammatory changes. Spleen: Normal in size without focal abnormality. Adrenals/Urinary Tract: The adrenal glands are within normal limits. A mild horseshoe kidney is noted. No obstructive changes or renal calculi are seen. The bladder is decompressed. Stomach/Bowel: No  inflammatory or obstructive changes are seen. Scattered diverticular change of the colon is noted. The appendix is not well visualized although no inflammatory changes are seen. Vascular/Lymphatic: No significant vascular findings are present. No enlarged abdominal or pelvic lymph nodes. Reproductive: Prostate is unremarkable. Other: Few small foci of free air are noted likely related to the patient's peritoneal dialysis catheter. No significant free pelvic fluid is seen. The dialysis catheter lies just posterior to the anterior abdominal wall to the right of the midline. The tract of the catheter appears within normal limits. Musculoskeletal: Degenerative changes of the lumbar spine are noted. IMPRESSION: Few small foci of free air in the upper abdomen likely related to the patient's known dialysis catheter. No acute intra-abdominal abnormality is seen. Electronically Signed   By: Alcide Clever M.D.   On: 03/09/2016 09:03   Dg Chest 2 View  Result Date: 03/09/2016 CLINICAL DATA:  Shortness of Breath EXAM: CHEST  2 VIEW COMPARISON:  01/10/2016 FINDINGS: Cardiac shadow is mildly prominent but stable. Postsurgical changes are again seen. The lungs are well aerated bilaterally with left lower lobe infiltrate new  from the prior exam. No sizable effusion is seen. IMPRESSION: Left lower lobe infiltrate. Electronically Signed   By: Alcide Clever M.D.   On: 03/09/2016 08:25    Dialysis Orders: Newly started PD (3/5), previously HD at [facility]. Will clarify orders with HD unit.  Assessment/Plan: 1.  Hyperkalemia: The lab findings seem consistent with under-dialysis this week in the setting of changing dialysis modalities. Since he is so new to PD and does not have established orders, I think using PD and kayexalate for the hyperkalemia is probably not going to be as effective as desired. Therefore, we decided to do a hemodialysis session today which can quickly correct K and volume to get back on track.  2.   Abdominal pain: Unclear cause, CT negative. Could just be soreness from recent PD cath insertion and getting used to PD, but would check PD fluid cell count, gram stain, Cx to make sure no infection present. 3. ?Pneumonia: Abx per primary (wait until after HD, PD Cx collected). 4.  ESRD:  As above, will do HD today, then likely transition back to PD. 5.  Hypertension/volume: BP ok, but volume up on exam. Trying 4L goal today. 6.  Anemia: Hgb 9.7. Unclear when last ESA was given, will need to clarify with his dialysis unit. 7.  Metabolic bone disease:Ca ok. Continue binders once eating. 8.  Nutrition: Alb 3.6. Monitor. 9. Type 2 DM: Per primary. 10. CAD: Per primary.  Ozzie Hoyle, PA-C 03/09/2016, 11:11 AM  [facility] Pager: [phone]  Pt seen, examined and agree w A/P as above.  [name] MD [employer] pager [phone]   03/09/2016, 4:16 PM

## 2016-03-09 NOTE — ED Notes (Signed)
Patient given mouth swab just to wet mouth

## 2016-03-09 NOTE — Procedures (Signed)
  I was present at this dialysis session, have reviewed the session itself and made  appropriate changes Vinson Moselleob Rocio Roam MD [facility] pager [phone]   03/09/2016, 4:17 PM

## 2016-03-09 NOTE — Progress Notes (Signed)
New Admission Note:   Arrival Method: stretcher  Mental Orientation: alert and oriented x4  Telemetry: box 8  Assessment: Completed Skin: see flow sheet Iv: right AC  Pain:denies  Tubes: PD cath left lower quadrant   Safety Measures: Safety Fall Prevention Plan has been discussed Admission: in process [place] Orientation: Patient has been orientated to the room, unit and staff.  Family: at bedside   Orders have been reviewed and implemented. Will continue to monitor the patient. Call light has been placed within reach and bed alarm has been activated.   Nelma RothmanNatalie Naven Giambalvo, RN [facility]  Phone number: [phone]

## 2016-03-09 NOTE — Progress Notes (Signed)
Pharmacy Antibiotic Note  Russell [name] is a 46 y.o. male admitted on 03/09/2016 with pneumonia.  Pharmacy has been consulted for cefepime/vancomycin dosing. PMH of ESRD newly on PD (goes to HD center currently to be taught to do PD at home). Pt reports PD sessions not removing as much fluid. EDP note states possible HD today - to consult Renal. Afebrile, WBC 12, LA 1.67.  Plan: Vancomycin 2500mg  IV x 1 Cefepime 1g IV q24h Monitor clinical progress, c/s, abx plan/LOT F/u Renal plans for HD vs. PD for maintenance dosing    Temp (24hrs), Avg:98.5 F (36.9 C), Min:98.5 F (36.9 C), Max:98.5 F (36.9 C)   Recent Labs Lab 03/09/16 0726 03/09/16 0758  WBC 12.0*  --   CREATININE 11.40*  --   LATICACIDVEN  --  1.67    CrCl cannot be calculated (Unknown ideal weight.).    Allergies  Allergen Reactions  . Ibuprofen Other (See Comments)    MD told him not to take due to kidney disease.  . Nsaids Other (See Comments)    Told to avoid all nsaids due to kidney disease-takes acetaminophen as needed    Babs BertinHaley Kymere Fullington, PharmD, BCPS Clinical Pharmacist 03/09/2016 9:05 AM

## 2016-03-09 NOTE — ED Provider Notes (Signed)
Emergency Department Provider Note   I have reviewed the triage vital signs and the nursing notes.   HISTORY  Chief Complaint Abdominal Pain and Shortness of Breath   HPI Russell [name] is a 46 y.o. male with PMH of ESRD newly on PD, HTN, NSTEMI, and DM presents to the emergency department for evaluation of left upper quadrant abdominal pain and difficulty breathing. The patient last had peritoneal dialysis yesterday. He goes to [facility] in [place] for HD. He gets his peritoneal dialysis done at the dialysis center currently because her trying to teach her how to do it at home. He still has a working fistula in his left arm. The peritoneal dialysis catheter was placed on 02/18/2016 with no immediate complications. He has noticed that the dialysis sessions do not seem to be removing as much fluid. Yesterday he developed some severe, sharp, nonradiating, focal pain in the left upper quadrant near his catheter insertion site. Denies any redness or drainage from the site. No fevers or shaking chills. No pain other parts of the abdomen. He's developed some difficulty breathing and wheezing in addition to the above symptoms. Denies chest pain or back pain. No modifying factors. Pain is constant and worsening.    Past Medical History:  Diagnosis Date  . Age-related macular degeneration, wet, both eyes (HCC)    "I'm getting Alia treatments" (12/30/2014)  . Anxiety   . Asthma   . 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 Daily headache   . Depression   . DVT (deep venous thrombosis), H/o 01/2014-on Xarelto 03/24/2014   LLE  . Hypertension   . Hypothyroidism   . Nephrotic syndrome 05/18/2014  . NSTEMI (non-ST elevated myocardial infarction) (HCC)   . Pneumonia 11/2013  . Renal insufficiency   .  Secondary DM with DKA-AG=16, BIcarb Nl 03/24/2014  . Type 2 diabetes mellitus with diabetic nephropathy [facility])     Patient Active Problem List   Diagnosis Date Noted  . S/P CABG (coronary artery bypass graft) 09/28/2015  . Coronary artery disease involving native coronary artery of native heart with unstable angina pectoris (HCC)   . Hyperglycemia due to type 2 diabetes mellitus (HCC) 09/20/2015  . NSTEMI (non-ST elevated myocardial infarction) (HCC)   . Elevated troponin   . Angina pectoris (HCC) 07/20/2015  . Precordial pain 07/20/2015  . Hyperglycemia 07/20/2015  . DKA (diabetic ketoacidoses) (HCC) 02/13/2015  . ESRD on dialysis (HCC) 02/13/2015  . Nausea and vomiting 02/13/2015  . Nephrotic syndrome 01/03/2015  . Hypertensive urgency 12/30/2014  . Essential hypertension, benign   . Thyroid activity decreased   . Hx of gastroesophageal reflux (GERD) 09/30/2014  . Prolonged Q-T interval on ECG 09/30/2014  . Acute on chronic renal failure (HCC) 09/24/2014  . Diabetic neuropathy (HCC) 09/21/2014  . Uncontrolled diabetes mellitus type 2 with peripheral artery disease (HCC) 09/21/2014  . Morbid obesity (HCC) 09/21/2014  . Superficial thrombophlebitis 03/24/2014  . Hypothyroidism (acquired) 03/24/2014    Past Surgical History:  Procedure Laterality Date  . ANKLE FRACTURE SURGERY Right 1988  . AV FISTULA PLACEMENT Left 01/01/2015   Procedure: CREATION OF LEFT RADIAL CEPHALIC ARTERIOVENOUS (AV) FISTULA ;  Surgeon: Pryor Ochoa, MD;  Location: [facility] OR;  Service: Vascular;  Laterality: Left;  . CARDIAC CATHETERIZATION  N/A 09/22/2015   Procedure: Left Heart Cath and Coronary Angiography;  Surgeon: Iran Ouch, MD;  Location: [facility] INVASIVE CV LAB;  Service: Cardiovascular;  Laterality: N/A;  . CORONARY ARTERY BYPASS GRAFT N/A 09/28/2015   Procedure: CORONARY ARTERY BYPASS GRAFTING (CABG) x 3 UTILIZING LEFT MAMMARY ARTERY AND ENDOSCOPICALLY HARVESTED LEFT GREATER SAPHENOUS VEIN.;   Surgeon: Delight Ovens, MD;  Location: [facility] OR;  Service: Open Heart Surgery;  Laterality: N/A;  . ENDOVEIN HARVEST OF GREATER SAPHENOUS VEIN Left 09/28/2015   Procedure: ENDOVEIN HARVEST OF GREATER SAPHENOUS VEIN;  Surgeon: Delight Ovens, MD;  Location: [facility] OR;  Service: Open Heart Surgery;  Laterality: Left;  . ESOPHAGOGASTRODUODENOSCOPY N/A 03/29/2014   Procedure: ESOPHAGOGASTRODUODENOSCOPY (EGD);  Surgeon: Dorena Cookey, MD;  Location: [facility]COPY;  Service: Endoscopy;  Laterality: N/A;  . EYE SURGERY    . FRACTURE SURGERY    . INSERTION OF DIALYSIS CATHETER Right 01/05/2015   Procedure: INSERTION OF RIGHT INTERNAL JUGULAR DIALYSIS CATHETER;  Surgeon: Fransisco Hertz, MD;  Location: [facility] OR;  Service: Vascular;  Laterality: Right;  . TEE WITHOUT CARDIOVERSION N/A 09/28/2015   Procedure: TRANSESOPHAGEAL ECHOCARDIOGRAM (TEE);  Surgeon: Delight Ovens, MD;  Location: [facility] OR;  Service: Open Heart Surgery;  Laterality: N/A;  . TONSILLECTOMY AND ADENOIDECTOMY  1970s    Current Outpatient Rx  . Order #: [id number] Class: Normal  . Order #: [id number] Class: Normal  . Order #: [id number] Class: Normal  . Order #: [id number] Class: Normal  . Order #: [id number] Class: No Print  . Order #: [id number] Class: Normal  . Order #: [id number] Class: Print  . Order #: [id number] Class: Normal  . Order #: [id number] Class: Normal  . Order #: [id number] Class: Historical Med  . Order #: [id number] Class: Normal  . Order #: [id number] Class: Normal  . Order #: [id number] Class: Normal  . Order #: [id number] Class: Print  . Order #: [id number] Class: Print  . Order #: [id number] Class: Historical Med  . Order #: [id number] Class: Historical Med  . Order #: [id number] Class: Normal  . Order #: [id number] Class: Normal  . Order #: [id number] Class: Print  . Order #: [id number] Class: Normal  . Order #: [id number] Class: Print  . Order #: [id number] Class: Print  . Order #: [id number] Class: Print    Allergies Ibuprofen and Nsaids  Family  History  Problem Relation Age of Onset  . Obesity Mother     Patient states that family members have no other medical illnesses other than what I have described  . Kidney cancer Maternal Grandmother   . Cancer Father 50    AML  . Heart disease      No family history    Social History Social History  Substance Use Topics  . Smoking status: Never Smoker  . Smokeless tobacco: Never Used  . Alcohol use 0.0 oz/week    Review of Systems  Constitutional: No fever/chills Eyes: No visual changes. ENT: No sore throat. Cardiovascular: Denies chest pain. Respiratory: Positive shortness of breath. Gastrointestinal: Positive LUQ abdominal pain.  No nausea, no vomiting.  No diarrhea.  No constipation. Genitourinary: Pain at PD cath site.  Musculoskeletal: Negative for back pain. Skin: Negative for rash. Neurological: Negative for headaches, focal weakness or numbness.  10-point ROS otherwise negative.  ____________________________________________   PHYSICAL EXAM:  VITAL SIGNS: ED Triage Vitals  Enc Vitals Group     BP 03/09/16 0713 139/85     Pulse Rate 03/09/16 0713 84     Resp 03/09/16 0713 20     Temp 03/09/16 0713 98.5 F (36.9 C)  Temp Source 03/09/16 0713 Oral     SpO2 03/09/16 0713 100 %     Pain Score 03/09/16 0720 10   Constitutional: Alert and oriented. Well appearing and in no acute distress. Eyes: Conjunctivae are normal.  Head: Atraumatic. Nose: No congestion/rhinnorhea. Mouth/Throat: Mucous membranes are dry. Oropharynx non-erythematous. Neck: No stridor.   Cardiovascular: Normal rate, regular rhythm. Good peripheral circulation. Grossly normal heart sounds. Fistula in LUE with palpable thrill.  Respiratory: Increased respiratory effort.  No retractions. Lungs with diffuse wheezing and crackles at the bases bilaterally.  Gastrointestinal: Soft with PD cath in the LUQ. No surrounding erythema, wound drainage. Mild tenderness to palpation over the site. No  distention.  Musculoskeletal: No lower extremity tenderness nor edema. No gross deformities of extremities. Neurologic:  Normal speech and language. No gross focal neurologic deficits are appreciated.  Skin:  Skin is warm, dry and intact. No rash noted. Psychiatric: Mood and affect are normal. Speech and behavior are normal.  ____________________________________________   LABS (all labs ordered are listed, but only abnormal results are displayed)  Labs Reviewed  COMPREHENSIVE METABOLIC PANEL - Abnormal; Notable for the following:       Result Value   Sodium 129 (*)    Potassium 6.6 (*)    Chloride 95 (*)    CO2 18 (*)    Glucose, Bld 394 (*)    BUN 98 (*)    Creatinine, Ser 11.40 (*)    Calcium 8.0 (*)    AST 14 (*)    ALT 13 (*)    GFR calc non Af Amer 5 (*)    GFR calc Af Amer 5 (*)    Anion gap 16 (*)    All other components within normal limits  CBC WITH DIFFERENTIAL/PLATELET - Abnormal; Notable for the following:    WBC 12.0 (*)    RBC 3.59 (*)    Hemoglobin 9.7 (*)    HCT 29.9 (*)    RDW 17.6 (*)    Neutro Abs 9.9 (*)    All other components within normal limits  CBG MONITORING, ED - Abnormal; Notable for the following:    Glucose-Capillary 365 (*)    All other components within normal limits  CULTURE, BLOOD (ROUTINE X 2)  CULTURE, BLOOD (ROUTINE X 2)  LIPASE, BLOOD  AMMONIA  PROTIME-INR  I-STAT CG4 LACTIC ACID, ED  I-STAT TROPOININ, ED   ____________________________________________  EKG   EKG Interpretation  Date/Time:  Thursday March 09 2016 06:56:46 EST Ventricular Rate:  81 PR Interval:    QRS Duration: 97 QT Interval:  413 QTC Calculation: 480 R Axis:   -3 Text Interpretation:  Sinus rhythm Anterior infarct, old No STEMI.  Confirmed by Jiaire Rosebrook MD, Tonna Palazzi [phone]) on 03/09/2016 7:00:36 AM       ____________________________________________  RADIOLOGY  Ct Abdomen Pelvis Wo Contrast  Result Date: 03/09/2016 CLINICAL DATA:  Severe right upper  quadrant pain EXAM: CT ABDOMEN AND PELVIS WITHOUT CONTRAST TECHNIQUE: Multidetector CT imaging of the abdomen and pelvis was performed following the standard protocol without IV contrast. COMPARISON:  None. FINDINGS: Lower chest: Mild bibasilar atelectatic changes are noted. No sizable effusion is seen. Hepatobiliary: No focal liver abnormality is seen. No gallstones, gallbladder wall thickening, or biliary dilatation. Pancreas: Unremarkable. No pancreatic ductal dilatation or surrounding inflammatory changes. Spleen: Normal in size without focal abnormality. Adrenals/Urinary Tract: The adrenal glands are within normal limits. A mild horseshoe kidney is noted. No obstructive changes or renal calculi are seen. The bladder  is decompressed. Stomach/Bowel: No inflammatory or obstructive changes are seen. Scattered diverticular change of the colon is noted. The appendix is not well visualized although no inflammatory changes are seen. Vascular/Lymphatic: No significant vascular findings are present. No enlarged abdominal or pelvic lymph nodes. Reproductive: Prostate is unremarkable. Other: Few small foci of free air are noted likely related to the patient's peritoneal dialysis catheter. No significant free pelvic fluid is seen. The dialysis catheter lies just posterior to the anterior abdominal wall to the right of the midline. The tract of the catheter appears within normal limits. Musculoskeletal: Degenerative changes of the lumbar spine are noted. IMPRESSION: Few small foci of free air in the upper abdomen likely related to the patient's known dialysis catheter. No acute intra-abdominal abnormality is seen. Electronically Signed   By: Alcide CleverMark  Lukens M.D.   On: 03/09/2016 09:03   Dg Chest 2 View  Result Date: 03/09/2016 CLINICAL DATA:  Shortness of Breath EXAM: CHEST  2 VIEW COMPARISON:  01/10/2016 FINDINGS: Cardiac shadow is mildly prominent but stable. Postsurgical changes are again seen. The lungs are well aerated  bilaterally with left lower lobe infiltrate new from the prior exam. No sizable effusion is seen. IMPRESSION: Left lower lobe infiltrate. Electronically Signed   By: Alcide CleverMark  Lukens M.D.   On: 03/09/2016 08:25    ____________________________________________   PROCEDURES  Procedure(s) performed:   Procedures  None ____________________________________________   INITIAL IMPRESSION / ASSESSMENT AND PLAN / ED COURSE  Pertinent labs & imaging results that were available during my care of the patient were reviewed by me and considered in my medical decision making (see chart for details).  Patient resents to the emergency department for evaluation of abdominal pain and difficulty breathing. He recently had a patient of dialysis catheter inserted and has been doing peritoneal dialysis with his last treatment yesterday. He is describing focal pain over the catheter insertion site. No other abdominal tenderness on exam. Very low suspicion for SBP with no infection symptoms and focal abdominal pain near catheter insertion. Patient also with wheezing and crackles at the bases. Suspect he may be slightly volume overloaded. Plan to obtain chest x-ray and CT scan of the abdomen and pelvis without contrast given his renal disease. The family member at bedside reports some mild confusion so will send ammonia in addition to baseline labs. EKG is unremarkable.  08:47 AM Patient with critical high potassium and LLL infiltrate on CXR along with critical high potassium. No changes on EKG. Will start HCAP abx with patient's dyspnea and infiltrate. Will follow CT results but plan on arranging HD today and admission.   09:38 AM Discussed patient's case with IM teaching service. Patient and family (if present) updated with plan. Care transferred to IM service. Spoke with Nephrology who will see the patient and arrange for HD.   I reviewed all nursing notes, vitals, pertinent old records, EKGs, labs, imaging (as  available).  ____________________________________________  FINAL CLINICAL IMPRESSION(S) / ED DIAGNOSES  Final diagnoses:  Left upper quadrant pain  Shortness of breath  Hyperkalemia  Healthcare-associated pneumonia     MEDICATIONS GIVEN DURING THIS VISIT:  Medications  vancomycin (VANCOCIN) 2,500 mg in sodium chloride 0.9 % 500 mL IVPB (not administered)  ceFEPIme (MAXIPIME) 1 g in dextrose 5 % 50 mL IVPB (not administered)  ipratropium-albuterol (DUONEB) 0.5-2.5 (3) MG/3ML nebulizer solution 3 mL (3 mLs Nebulization Given 03/09/16 0736)  HYDROmorphone (DILAUDID) injection 0.5 mg (0.5 mg Intravenous Given 03/09/16 0736)     NEW OUTPATIENT  MEDICATIONS STARTED DURING THIS VISIT:  None   Note:  This document was prepared using Dragon voice recognition software and may include unintentional dictation errors.  Alona Bene, MD Emergency Medicine   Maia Plan, MD 03/09/16 [phone]

## 2016-03-09 NOTE — ED Triage Notes (Signed)
Transitioning from hemo to peritoneal dialysis.  Took dilaudid 0430 for 10/10 RUQ abd pn..  No nausea or vomiting.  Ascites present.  CBG 475 mg/dL.   Upper airway wheezing. SOB.  12 lead unremarkable.  128/72, 97% RA, 100% with nebulizer.  5 mg albuterol, 125mg  solumedrol.  18 G R AC.  L restricted extremity.

## 2016-03-10 DIAGNOSIS — T8571XA Infection and inflammatory reaction due to peritoneal dialysis catheter, initial encounter: Secondary | ICD-10-CM | POA: Diagnosis present

## 2016-03-10 LAB — BASIC METABOLIC PANEL
ANION GAP: 15 (ref 5–15)
BUN: 76 mg/dL — ABNORMAL HIGH (ref 6–20)
CALCIUM: 8.2 mg/dL — AB (ref 8.9–10.3)
CHLORIDE: 90 mmol/L — AB (ref 101–111)
CO2: 21 mmol/L — AB (ref 22–32)
Creatinine, Ser: 8.59 mg/dL — ABNORMAL HIGH (ref 0.61–1.24)
GFR calc non Af Amer: 7 mL/min — ABNORMAL LOW (ref >60)
GFR, EST AFRICAN AMERICAN: 8 mL/min — AB (ref >60)
GLUCOSE: 568 mg/dL — AB (ref 65–99)
Potassium: 5.7 mmol/L — ABNORMAL HIGH (ref 3.5–5.1)
Sodium: 126 mmol/L — ABNORMAL LOW (ref 135–145)

## 2016-03-10 LAB — MRSA PCR SCREENING: MRSA by PCR: NEGATIVE

## 2016-03-10 LAB — BODY FLUID CELL COUNT WITH DIFFERENTIAL
Lymphs, Fluid: 2 %
Monocyte-Macrophage-Serous Fluid: 24 % — ABNORMAL LOW (ref 50–90)
NEUTROPHIL FLUID: 74 % — AB (ref 0–25)
WBC FLUID: 300 uL (ref 0–1000)

## 2016-03-10 LAB — CBC
HEMATOCRIT: 28.5 % — AB (ref 39.0–52.0)
HEMOGLOBIN: 9 g/dL — AB (ref 13.0–17.0)
MCH: 26.4 pg (ref 26.0–34.0)
MCHC: 31.6 g/dL (ref 30.0–36.0)
MCV: 83.6 fL (ref 78.0–100.0)
Platelets: 283 10*3/uL (ref 150–400)
RBC: 3.41 MIL/uL — ABNORMAL LOW (ref 4.22–5.81)
RDW: 17.5 % — AB (ref 11.5–15.5)
WBC: 6.8 10*3/uL (ref 4.0–10.5)

## 2016-03-10 LAB — URINALYSIS, ROUTINE W REFLEX MICROSCOPIC
Bilirubin Urine: NEGATIVE
KETONES UR: NEGATIVE mg/dL
Leukocytes, UA: NEGATIVE
NITRITE: NEGATIVE
SQUAMOUS EPITHELIAL / LPF: NONE SEEN
Specific Gravity, Urine: 1.017 (ref 1.005–1.030)
pH: 5 (ref 5.0–8.0)

## 2016-03-10 LAB — HEPATITIS B SURFACE ANTIGEN: Hepatitis B Surface Ag: NEGATIVE

## 2016-03-10 LAB — GLUCOSE, CAPILLARY
GLUCOSE-CAPILLARY: 206 mg/dL — AB (ref 65–99)
Glucose-Capillary: 506 mg/dL (ref 65–99)
Glucose-Capillary: 108 mg/dL — ABNORMAL HIGH (ref 65–99)
Glucose-Capillary: 108 mg/dL — ABNORMAL HIGH (ref 65–99)
Glucose-Capillary: 164 mg/dL — ABNORMAL HIGH (ref 65–99)
Glucose-Capillary: 152 mg/dL — ABNORMAL HIGH (ref 65–99)

## 2016-03-10 LAB — PATHOLOGIST SMEAR REVIEW: Path Review: REACTIVE

## 2016-03-10 LAB — HIV ANTIBODY (ROUTINE TESTING W REFLEX): HIV Screen 4th Generation wRfx: NONREACTIVE

## 2016-03-10 LAB — GRAM STAIN

## 2016-03-10 MED ORDER — CALCITRIOL 0.25 MCG PO CAPS
ORAL_CAPSULE | ORAL | Status: AC
  Filled 2016-03-10: qty 1

## 2016-03-10 MED ORDER — INSULIN GLARGINE 100 UNIT/ML ~~LOC~~ SOLN: 30.0000 [IU] | Freq: Two times a day (BID) | SUBCUTANEOUS | 11 refills | Status: DC

## 2016-03-10 MED ORDER — ALTEPLASE 2 MG IJ SOLR: 2.0000 mg | Freq: Once | INTRAMUSCULAR | Status: DC | PRN

## 2016-03-10 MED ORDER — INSULIN GLARGINE 100 UNIT/ML ~~LOC~~ SOLN
30.0000 [IU] | Freq: Two times a day (BID) | SUBCUTANEOUS | Status: DC
  Administered 2016-03-10: 30 [IU] via SUBCUTANEOUS
  Filled 2016-03-10 (×2): qty 0.3

## 2016-03-10 MED ORDER — CITALOPRAM HYDROBROMIDE 40 MG PO TABS: 40.0000 mg | ORAL_TABLET | Freq: Every day | ORAL | Status: DC

## 2016-03-10 MED ORDER — PENTAFLUOROPROP-TETRAFLUOROETH EX AERO: 1.0000 "application " | INHALATION_SPRAY | CUTANEOUS | Status: DC | PRN

## 2016-03-10 MED ORDER — HEPARIN SODIUM (PORCINE) 1000 UNIT/ML DIALYSIS: 1000.0000 [IU] | INTRAMUSCULAR | Status: DC | PRN

## 2016-03-10 MED ORDER — VANCOMYCIN HCL 10 G IV SOLR
2500.0000 mg | INTRAVENOUS | Status: AC
  Administered 2016-03-10: 2500 mg via INTRAVENOUS
  Filled 2016-03-10 (×2): qty 2500

## 2016-03-10 MED ORDER — GENTAMICIN SULFATE 0.1 % EX CREA
TOPICAL_CREAM | Freq: Three times a day (TID) | CUTANEOUS | Status: DC
  Administered 2016-03-10: 21:00:00 via TOPICAL
  Filled 2016-03-10: qty 15

## 2016-03-10 MED ORDER — DEXTROSE 5 % IV SOLN: 2.0000 g | INTRAVENOUS | Status: DC

## 2016-03-10 MED ORDER — SODIUM CHLORIDE 0.9 % IV SOLN: 100.0000 mL | INTRAVENOUS | Status: DC | PRN

## 2016-03-10 MED ORDER — INSULIN ASPART 100 UNIT/ML ~~LOC~~ SOLN
15.0000 [IU] | Freq: Once | SUBCUTANEOUS | Status: AC
  Administered 2016-03-10: 15 [IU] via SUBCUTANEOUS

## 2016-03-10 MED ORDER — LIDOCAINE-PRILOCAINE 2.5-2.5 % EX CREA: 1.0000 "application " | TOPICAL_CREAM | CUTANEOUS | Status: DC | PRN

## 2016-03-10 MED ORDER — HEPARIN SODIUM (PORCINE) 1000 UNIT/ML DIALYSIS
6000.0000 [IU] | INTRAMUSCULAR | Status: DC | PRN
  Administered 2016-03-10: 6000 [IU] via INTRAVENOUS_CENTRAL
  Filled 2016-03-10 (×2): qty 6

## 2016-03-10 MED ORDER — LIDOCAINE HCL (PF) 1 % IJ SOLN: 5.0000 mL | INTRAMUSCULAR | Status: DC | PRN

## 2016-03-10 MED ORDER — VANCOMYCIN HCL 10 G IV SOLR: 2500.0000 mg | INTRAVENOUS | 0 refills | Status: DC

## 2016-03-10 MED ORDER — DEXTROSE 5 % IV SOLN
2.0000 g | Freq: Once | INTRAVENOUS | Status: AC
  Administered 2016-03-10: 2 g via INTRAVENOUS
  Filled 2016-03-10: qty 2

## 2016-03-10 NOTE — Progress Notes (Signed)
Pharmacy Antibiotic Note  Russell BirkenheadSteven B Mickiewicz is a 46 y.o. male admitted on 03/09/2016 with possible peritonitis.  Pharmacy has been consulted for vancomycin and ceftazidime dosing.  Patient recently transitioned from HD to PD on the outpatient side. S/p last night for hyperkalemia, to have another session today (plan is 3h).  Plan: -Fortaz 2g IV x1 after HD on 3/9 -Vanc 2500mg  IV x1 after HD on 3/9 -Discussed with Dr. Vincente LibertyMolt from [facility]- patient may be discharged after HD 3/9 and unlikely to continue PD, but ultimately renal's call on dialysis modality -If discharged, can continue Fortaz 2g IV qHD and recommend vanc 1g IV qHD for now with a level at outpt unit and dose adjusted accordingly  Weight: 259 lb 9.6 oz (117.8 kg)  Temp (24hrs), Avg:98.1 F (36.7 C), Min:97.7 F (36.5 C), Max:98.3 F (36.8 C)   Recent Labs Lab 03/09/16 0726 03/09/16 0758 03/10/16 0415  WBC 12.0*  --  6.8  CREATININE 11.40*  --  8.59*  LATICACIDVEN  --  1.67  --     Estimated Creatinine Clearance: 14.8 mL/min (by C-G formula based on SCr of 8.59 mg/dL (H)).    Allergies  Allergen Reactions  . Ibuprofen Other (See Comments)    MD told him not to take due to kidney disease.  . Nsaids Other (See Comments)    Told to avoid all nsaids due to kidney disease-takes acetaminophen as needed    Antimicrobials this admission:  Elita QuickFortaz 3/9 >>  Vanc 3/9>>  Dose adjustments this admission:  N/a  Microbiology results:  3/8 BCx: ngtd 3/9 MRSA PCR: neg 3/9 peritoneal fluid:  Thank you for allowing pharmacy to be a part of this patient's care.  Miyeko Mahlum D. Javell Blackburn, PharmD, BCPS Clinical Pharmacist Pager: [phone] 03/10/2016 12:40 PM

## 2016-03-10 NOTE — Progress Notes (Signed)
Patient states " I will take my medications when I get home.

## 2016-03-10 NOTE — H&P (Signed)
Internal Medicine Attending Admission Note  I saw and evaluated the patient. I reviewed the resident's note and I agree with the resident's findings and plan as documented in the resident's note.  Assessment & Plan by Problem:   Peritonitis associated with peritoneal dialysis (HCC),  ESRD on dialysis (HCC) -Cell count 300 with neutrophil predominance. Gram stain has no organisms. We discussed with nephrology will start Vanco and [place]. After hemodialysis today may be discharged with follow-up outpatient.    Uncontrolled diabetes mellitus type 2 with peripheral artery disease (HCC) -hyperglycemic overnight however appears to be better controlled  Chief Complaint(s):abdominal pain  History - key components related to admission:Breifly Russell BirkenheadSteven B [name] is a 46 year old male with a past medical history of ESRD secondary to diabetes. He was previously on hemodialysis, however has recently switched to peritoneal dialysis. He presented complaints of increased abdominal pain mostly on the right side.. Pain is 10 out of 10 at its worst is grossly constant has been associated with some nausea however no fevers. A chest x-ray was obtained which was concerning for left lower lobe infiltrate a CT of the abdomen didn't include part of the lungs which appear to be consistent with atelectasis versus pneumonia. He was noted to be volume overloaded on presentation and mildly hypokalemic likely due to under response from peritoneal dialysis. His peritoneal fluid was obtained and sent for cell count and culture and Gram stain, cell count was revealing for white blood cells of 300 neutrophil predominance, Gram stain was negative for organisms and culture is pending. Nephrology took yesterday for hemodialysis. Patient reports he feels better today however abdominal pain is still present.  Lab results: Reviewed in Epic  Physical Exam - key components related to admission: General: resting in bed HEENT: PERRL, EOMI, no  scleral icterus Cardiac: RRR, no rubs, murmurs or gallops Pulm: clear to auscultation bilaterally, moving normal volumes of air Abd: soft, BS present, mod TTP in all quadrants Ext: warm and well perfused, trace pedal edema Neuro: alert and oriented   Vitals:   03/10/16 1433 03/10/16 1500 03/10/16 1530 03/10/16 1600  BP: 136/82 (!) 147/89 124/65 126/77  Pulse: 83 90 91 92  Resp: 16 16 17 13   Temp:      TempSrc:      SpO2:      Weight:

## 2016-03-10 NOTE — Progress Notes (Signed)
Spoke with dialysis RN-Sal and recommended checking CBG due to wide fluctuations in blood sugars today.  He states that he will check. Also discussed with RN-Natalie.  Thanks, Beryl MeagerJenny Domanic Matusek, RN, BC-ADM Inpatient Diabetes Coordinator Pager [phone]

## 2016-03-10 NOTE — Progress Notes (Signed)
   Subjective: [name] was seen and evaluated today at bedside. Reports some improvement in symptoms after HD session yesterday however continues to endorse abdominal pain. No fevers or chills. Reports he feels ready for discharge after his HD session today.   Objective:  Vital signs in last 24 hours: Vitals:   03/09/16 2032 03/10/16 0407 03/10/16 1001 03/10/16 1108  BP: 119/78 132/75 103/64 133/88  Pulse: 93 81 87 86  Resp: 18 19 18    Temp: 98.3 F (36.8 C) 98.3 F (36.8 C) 97.7 F (36.5 C)   TempSrc: Oral Oral Oral   SpO2: 93% 100% 100%   Weight: 259 lb 9.6 oz (117.8 kg)       General: Obese caucasian male resting comfortably reclining in bed. In no acute distress.  HENT: EOMI. No conjunctival injection, icterus or ptosis.  Cardiovascular: Regular rate and rhythm. No murmur or rub appreciated. Pulmonary: Unlabored breathing.  Abdomen: Soft. Diffuse TTP, most prominent left upper and mid abdomen.  Skin: Warm, dry. No cyanosis.  Psych: Mood normal and affect was mood congruent. Responds to questions appropriately.   Assessment/Plan:  Principal Problem:   Abdominal pain Active Problems:   Hypothyroidism (acquired)   Diabetic neuropathy (HCC)   Uncontrolled diabetes mellitus type 2 with peripheral artery disease (HCC)   Essential hypertension, benign   ESRD on dialysis (HCC)   S/P CABG (coronary artery bypass graft)  Abdominal pain, concern for peritonitis: peritoneal fluid analysis concerning for peritonitis (cell count showing >300 WBC, 74% neutrophils). Gram stain with WBC however no organisms were appreciated. Will treat with IV Vancomycin and Ceftaz. Case discussed with nephro who agree with plan. Leukocytosis resolved and he was afebrile overnight. Abdominal pain improved a little bit after yesterdays HD session.  -Vanc + Ceftaz for peritonitis [ Fortaz 2g IV qHD + Vanc 1g IV qHD] -Initial blood cultures NGTD -Peritoneal cultures with WBC however no organisms  seen  ESRD on Dialysis, Hyperkalemia: Currently receiving HD while in hospital. Suspect peritonitis has hindered home PD resulting in his fluid overload. HD per nephro, appreciate their recs. Hyperkalemic today at 5.7.   Uncontrolled Type 2 Diabetes, Pseudohyponatremia: Corrected sodium 137 today. Hyperglycemic at 568 this morning. Started patient back on his home dose of lantus (30 units BID) + resistant sliding scale.  Dispo: Anticipated discharge today following HD or tomorrow.   Jahlisa Rossitto, DO 03/10/2016, 12:10 PM Pager: [phone]

## 2016-03-10 NOTE — Progress Notes (Signed)
Notified Dr that patient needed order for gentamycin cream for PD site.

## 2016-03-10 NOTE — Progress Notes (Signed)
Notified MD of CBG 506 this AM . Per MD lantus 30 units added. No coverage at this time.  Will continue to monitor.

## 2016-03-10 NOTE — Plan of Care (Signed)
Problem: Fluid Volume: Goal: Ability to maintain a balanced intake and output will improve Outcome: Progressing Reinforced Fluid restrictions.

## 2016-03-10 NOTE — Progress Notes (Addendum)
Patient dc/d to home via w/c to family car. Patient states that he understands discharge instructions.   IV discontinued, telemetry box dc'd. Gentamycin applied to PD catheter. Patient denies needs or wants, noted NAD

## 2016-03-10 NOTE — Progress Notes (Signed)
  [facility] Progress Note   Subjective: TNC on peritoneal fluid was 300 , cultures pending. Feeling better.  No SOB  Vitals:   03/09/16 2032 03/10/16 0407 03/10/16 1001 03/10/16 1108  BP: 119/78 132/75 103/64 133/88  Pulse: 93 81 87 86  Resp: 18 19 18    Temp: 98.3 F (36.8 C) 98.3 F (36.8 C) 97.7 F (36.5 C)   TempSrc: Oral Oral Oral   SpO2: 93% 100% 100%   Weight: 117.8 kg (259 lb 9.6 oz)       Inpatient medications: . aspirin EC  81 mg Oral Daily  . atorvastatin  80 mg Oral q1800  . calcitRIOL  0.25 mcg Oral Q M,W,F-HD  . calcium acetate  1,334 mg Oral TID WC  . citalopram  40 mg Oral QHS  . clopidogrel  75 mg Oral Daily  . gabapentin  200 mg Oral QID  . heparin  5,000 Units Subcutaneous Q8H  . insulin aspart  0-20 Units Subcutaneous TID WC  . insulin aspart  0-5 Units Subcutaneous QHS  . insulin glargine  30 Units Subcutaneous BID  . levothyroxine  50 mcg Oral QAC breakfast  . metoprolol tartrate  12.5 mg Oral BID  . pantoprazole  40 mg Oral Daily    HYDROcodone-acetaminophen, ipratropium-albuterol, metoCLOPramide  Exam: Obese WM calm, no distress No jvd Chest clear bilat RRR no mrg ABD obese, PD cath L mid-abd w mild erythema, no drainage / erythema/ fluctuance Ext trace LE edema  NF Ox 3 L FA AVF+bruit   Dialysis: Dr wt was 117k when on HD. Now training with PD.      Assessment: 1. Abd pain - CT negative, cell count ^ 300 wbc's in PD fluid.  Possible peritonitis, agree with starting on IV abx (vanc/fortaz), we will f/u the culture results if pt dc'd.   2. Hyperkalemia - better 3. ESRD - changing from HD to PD this and last week 4. 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 DM2 per primary 7. ?PNA - not felt to have PNA 8. Dispo - per primary team  Plan - 3hr HD today, get K down further.  Vanc/fortaz as above.    Vinson Moselleob Ferdinand Revoir MD [facility] pager [phone]   03/10/2016, 12:12 PM    Recent Labs Lab 03/09/16 0726  03/10/16 0415  NA 129* 126*  K 6.6* 5.7*  CL 95* 90*  CO2 18* 21*  GLUCOSE 394* 568*  BUN 98* 76*  CREATININE 11.40* 8.59*  CALCIUM 8.0* 8.2*    Recent Labs Lab 03/09/16 0726  AST 14*  ALT 13*  ALKPHOS 118  BILITOT 0.6  PROT 7.2  ALBUMIN 3.6    Recent Labs Lab 03/09/16 0726 03/10/16 0415  WBC 12.0* 6.8  NEUTROABS 9.9*  --   HGB 9.7* 9.0*  HCT 29.9* 28.5*  MCV 83.3 83.6  PLT 278 283   Iron/TIBC/Ferritin/ %Sat    Component Value Date/Time   IRON 29 (L) 10/03/2015 1158   TIBC 209 (L) 10/03/2015 1158   FERRITIN 238 10/03/2015 1158   IRONPCTSAT 14 (L) 10/03/2015 1158

## 2016-03-10 NOTE — Progress Notes (Signed)
Inpatient Diabetes Program Recommendations  AACE/ADA: New Consensus Statement on Inpatient Glycemic Control (2015)  Target Ranges:  Prepandial:   less than 140 mg/dL      Peak postprandial:   less than 180 mg/dL (1-2 hours)      Critically ill patients:  140 - 180 mg/dL   Lab Results  Component Value Date   GLUCAP 108 (H) 03/10/2016   HGBA1C 12.5 (H) 09/28/2015    Review of Glycemic ControlResults for Russell BirkenheadSILVER, Russell B (MRN [id number]) as of 03/10/2016 12:32  Ref. Range 03/09/2016 20:30 03/10/2016 07:34 03/10/2016 09:58 03/10/2016 11:37 03/10/2016 11:53  Glucose-Capillary Latest Ref Range: 65 - 99 mg/dL 119362 (H) 147506 (HH) 829206 (H) 108 (H) 108 (H)   Diabetes history: Type 2 diabetes  Outpatient Diabetes medications: Lantus 30 units bid, Humalog 15-30 units tid with meals Current orders for Inpatient glycemic control:  Lantus 30 units bid, Novolog resistant tid with meals and HS  Inpatient Diabetes Program Recommendations:    Note that CBG very elevated this morning.  Patient received 15 units of Novolog at 0654 plus 20 units of Novolog at 0824 (less than 2 hours).  Blood sugar now down to 108 mg/dL at lunch.  Lantus increased to 30 units bid.  Consider reducing Novolog correction to moderate and add Novolog meal coverage 6 units tid with meals.    Thanks,   Beryl MeagerJenny Alorah Mcree, RN, BC-ADM Inpatient Diabetes Coordinator Pager [phone] (8a-5p)

## 2016-03-11 NOTE — Progress Notes (Signed)
Date of note: 03-11-16, 20:15  Patient called regarding RX for antibiotics with HD.  Patient stated that he is not doing HD anymore, switched to PD.  Patient wanted to know what to do about his antibiotics.  I spoke to on-call MD for [facility]MT[facility] (Dr. Ladona Ridgelaylor).  Dr. Ladona Ridgelaylor stated that he would f/u with Renal and Dr. Vincente LibertyMolt (MD who discharged this patient yesterday).  Patient informed.  Peri MarisAndrew Glennis Borger, MBA, BSN, RN

## 2016-03-12 NOTE — Progress Notes (Signed)
Date of Note: 03-12-16 14:48  Spoke to Dr. Laural BenesJohnson with IMTS.  He said that he would speak to Dr. Vincente LibertyMolt, who is not here today.  He stated that he would get to the bottom of it.  Peri MarisAndrew Townes Fuhs, MBA, BSN, RN

## 2016-03-13 DIAGNOSIS — E440 Moderate protein-calorie malnutrition: Secondary | ICD-10-CM | POA: Diagnosis not present

## 2016-03-13 DIAGNOSIS — K769 Liver disease, unspecified: Secondary | ICD-10-CM | POA: Diagnosis not present

## 2016-03-13 DIAGNOSIS — D631 Anemia in chronic kidney disease: Secondary | ICD-10-CM | POA: Diagnosis not present

## 2016-03-13 DIAGNOSIS — N2589 Other disorders resulting from impaired renal tubular function: Secondary | ICD-10-CM | POA: Diagnosis not present

## 2016-03-13 DIAGNOSIS — E784 Other hyperlipidemia: Secondary | ICD-10-CM | POA: Diagnosis not present

## 2016-03-13 DIAGNOSIS — E1129 Type 2 diabetes mellitus with other diabetic kidney complication: Secondary | ICD-10-CM | POA: Diagnosis not present

## 2016-03-13 DIAGNOSIS — N2581 Secondary hyperparathyroidism of renal origin: Secondary | ICD-10-CM | POA: Diagnosis not present

## 2016-03-13 DIAGNOSIS — N186 End stage renal disease: Secondary | ICD-10-CM | POA: Diagnosis not present

## 2016-03-13 DIAGNOSIS — D509 Iron deficiency anemia, unspecified: Secondary | ICD-10-CM | POA: Diagnosis not present

## 2016-03-13 DIAGNOSIS — R52 Pain, unspecified: Secondary | ICD-10-CM | POA: Diagnosis not present

## 2016-03-13 NOTE — Progress Notes (Addendum)
  Progress Note   Date: 03/13/2016  Patient Name: Russell [name]        MRN#: [id number]   Review of the patient's clinical findings supports the diagnosis of Uncontrolled Diabetes with Hyperglycemia and Anemia of Chronic Kidney disease  It does not support Hyponatremia as his sodium is normal when corrected for hyperglycemia.      Gust RungErik C Sarah-Jane Nazario, DO

## 2016-03-14 DIAGNOSIS — N2581 Secondary hyperparathyroidism of renal origin: Secondary | ICD-10-CM | POA: Diagnosis not present

## 2016-03-14 DIAGNOSIS — D509 Iron deficiency anemia, unspecified: Secondary | ICD-10-CM | POA: Diagnosis not present

## 2016-03-14 DIAGNOSIS — K769 Liver disease, unspecified: Secondary | ICD-10-CM | POA: Diagnosis not present

## 2016-03-14 DIAGNOSIS — N2589 Other disorders resulting from impaired renal tubular function: Secondary | ICD-10-CM | POA: Diagnosis not present

## 2016-03-14 DIAGNOSIS — R52 Pain, unspecified: Secondary | ICD-10-CM | POA: Diagnosis not present

## 2016-03-14 DIAGNOSIS — N186 End stage renal disease: Secondary | ICD-10-CM | POA: Diagnosis not present

## 2016-03-14 LAB — CULTURE, BLOOD (ROUTINE X 2)
Culture: NO GROWTH
Culture: NO GROWTH

## 2016-03-15 DIAGNOSIS — D509 Iron deficiency anemia, unspecified: Secondary | ICD-10-CM | POA: Diagnosis not present

## 2016-03-15 DIAGNOSIS — N2589 Other disorders resulting from impaired renal tubular function: Secondary | ICD-10-CM | POA: Diagnosis not present

## 2016-03-15 DIAGNOSIS — N186 End stage renal disease: Secondary | ICD-10-CM | POA: Diagnosis not present

## 2016-03-15 DIAGNOSIS — N2581 Secondary hyperparathyroidism of renal origin: Secondary | ICD-10-CM | POA: Diagnosis not present

## 2016-03-15 DIAGNOSIS — K769 Liver disease, unspecified: Secondary | ICD-10-CM | POA: Diagnosis not present

## 2016-03-15 DIAGNOSIS — R52 Pain, unspecified: Secondary | ICD-10-CM | POA: Diagnosis not present

## 2016-03-15 LAB — CULTURE, BODY FLUID W GRAM STAIN -BOTTLE: Culture: NO GROWTH

## 2016-03-15 LAB — CULTURE, BODY FLUID-BOTTLE

## 2016-03-17 DIAGNOSIS — N186 End stage renal disease: Secondary | ICD-10-CM | POA: Diagnosis not present

## 2016-03-17 DIAGNOSIS — R52 Pain, unspecified: Secondary | ICD-10-CM | POA: Diagnosis not present

## 2016-03-17 DIAGNOSIS — N2581 Secondary hyperparathyroidism of renal origin: Secondary | ICD-10-CM | POA: Diagnosis not present

## 2016-03-18 DIAGNOSIS — N186 End stage renal disease: Secondary | ICD-10-CM | POA: Diagnosis not present

## 2016-03-18 DIAGNOSIS — N2581 Secondary hyperparathyroidism of renal origin: Secondary | ICD-10-CM | POA: Diagnosis not present

## 2016-03-18 DIAGNOSIS — R52 Pain, unspecified: Secondary | ICD-10-CM | POA: Diagnosis not present

## 2016-03-19 DIAGNOSIS — N2581 Secondary hyperparathyroidism of renal origin: Secondary | ICD-10-CM | POA: Diagnosis not present

## 2016-03-19 DIAGNOSIS — R52 Pain, unspecified: Secondary | ICD-10-CM | POA: Diagnosis not present

## 2016-03-19 DIAGNOSIS — N186 End stage renal disease: Secondary | ICD-10-CM | POA: Diagnosis not present

## 2016-03-20 DIAGNOSIS — R52 Pain, unspecified: Secondary | ICD-10-CM | POA: Diagnosis not present

## 2016-03-20 DIAGNOSIS — N186 End stage renal disease: Secondary | ICD-10-CM | POA: Diagnosis not present

## 2016-03-20 DIAGNOSIS — N2581 Secondary hyperparathyroidism of renal origin: Secondary | ICD-10-CM | POA: Diagnosis not present

## 2016-03-20 DIAGNOSIS — D72829 Elevated white blood cell count, unspecified: Secondary | ICD-10-CM | POA: Diagnosis not present

## 2016-03-20 NOTE — Discharge Summary (Signed)
Name: DAIVON RAYOS MRN: [id number] DOB: Apr 02, 1970 46 y.o. PCP: [name], MD  Date of Admission: 03/09/2016  6:48 AM Date of Discharge: 03/10/2016 Attending Physician: Carlynn Purl, D.O.  Discharge Diagnosis: 1. Peritonitis associated with peritoneal dialysis 2. ESRD 3. Type 2 diabetes Principal Problem:   Peritonitis associated with peritoneal dialysis [facility]) Active Problems:   Hypothyroidism (acquired)   Diabetic neuropathy (HCC)   Uncontrolled diabetes mellitus type 2 with peripheral artery disease (HCC)   Essential hypertension, benign   ESRD on dialysis (HCC)   S/P CABG (coronary artery bypass graft)   Abdominal pain   Discharge Medications: Allergies as of 03/10/2016      Reactions   Ibuprofen Other (See Comments)   MD told him not to take due to kidney disease.   Nsaids Other (See Comments)   Told to avoid all nsaids due to kidney disease-takes acetaminophen as needed      Medication List    STOP taking these medications   amoxicillin-clavulanate 875-125 MG tablet Commonly known as:  AUGMENTIN     TAKE these medications   acetaminophen 500 MG tablet Commonly known as:  TYLENOL Take 1 tablet (500 mg total) by mouth every 6 (six) hours as needed for moderate pain. What changed:  how much to take   albuterol (2.5 MG/3ML) 0.083% nebulizer solution Commonly known as:  PROVENTIL Take 2.5 mg by nebulization every 6 (six) hours as needed for wheezing.   albuterol 108 (90 Base) MCG/ACT inhaler Commonly known as:  PROVENTIL HFA;VENTOLIN HFA Inhale 1 puff into the lungs every 6 (six) hours as needed for wheezing or shortness of breath.   albuterol-ipratropium 18-103 MCG/ACT inhaler Commonly known as:  COMBIVENT Inhale 2 puffs into the lungs every 4 (four) hours. What changed:  when to take this  reasons to take this   amLODipine 10 MG tablet Commonly known as:  NORVASC Take 10 mg by mouth daily.   aspirin 81 MG EC tablet Take 1 tablet (81 mg total) by  mouth daily. What changed:  when to take this   atorvastatin 80 MG tablet Commonly known as:  LIPITOR Take 1 tablet (80 mg total) by mouth daily at 6 PM.   calcitRIOL 0.25 MCG capsule Commonly known as:  ROCALTROL Take 1 capsule (0.25 mcg total) by mouth every Monday, Wednesday, and Friday with hemodialysis.   calcium acetate 667 MG capsule Commonly known as:  PHOSLO Take 2 capsules (1,334 mg total) by mouth 3 (three) times daily with meals.   cefTAZidime 2 g in dextrose 5 % 50 mL Inject 2 g into the vein every Monday, Wednesday, and Friday. WITH HD SESSIONS x 2 weeks   citalopram 40 MG tablet Commonly known as:  CELEXA Take 1.5 tablets (60 mg total) by mouth at bedtime.   clonazePAM 2 MG tablet Commonly known as:  KLONOPIN Take 2 mg by mouth at bedtime.   clopidogrel 75 MG tablet Commonly known as:  PLAVIX Take 1 tablet (75 mg total) by mouth daily.   cyclobenzaprine 10 MG tablet Commonly known as:  FLEXERIL Take 1 tablet (10 mg total) by mouth at bedtime.   gabapentin 100 MG capsule Commonly known as:  NEURONTIN Take 2 capsules (200 mg total) by mouth 3 (three) times daily. What changed:  how much to take  when to take this   HYDROmorphone 2 MG tablet Commonly known as:  DILAUDID Take 1 tablet (2 mg total) by mouth every 8 (eight) hours as needed for  severe pain. What changed:  how much to take   hydrOXYzine 25 MG tablet Commonly known as:  ATARAX/VISTARIL Take 25 mg by mouth 2 (two) times daily as needed for anxiety.   insulin glargine 100 UNIT/ML injection Commonly known as:  LANTUS Inject 0.3 mLs (30 Units total) into the skin 2 (two) times daily.   insulin lispro 100 UNIT/ML injection Commonly known as:  HUMALOG Inject 0.15-0.3 mLs (15-30 Units total) into the skin every evening. Take one unit per 5 carbs depending on dinner. What changed:  when to take this  additional instructions   levothyroxine 50 MCG tablet Commonly known as:  SYNTHROID,  LEVOTHROID Take 1 tablet (50 mcg total) by mouth daily.   metoCLOPramide 5 MG tablet Commonly known as:  REGLAN Take 1 tablet (5 mg total) by mouth every 8 (eight) hours as needed for nausea or vomiting.   metoprolol tartrate 25 MG tablet Commonly known as:  LOPRESSOR Take 2 tablets (50 mg total) by mouth 2 (two) times daily. Take 2 tablets ( 50 mg ) twice a day What changed:  how much to take  additional instructions   polyethylene glycol packet Commonly known as:  MIRALAX Take 17 g by mouth daily.   promethazine 25 MG tablet Commonly known as:  PHENERGAN Take 1 tablet (25 mg total) by mouth every 6 (six) hours as needed for nausea or vomiting.   vancomycin 2,500 mg in sodium chloride 0.9 % 500 mL Inject 2,500 mg into the vein every Monday, Wednesday, and Friday with hemodialysis.       Disposition and follow-up:   Mr.Maurio B Aliberti was discharged from [facility] in stable condition.  At the hospital follow up visit please address:  1.  Peritonitis: Ensure completion of Vancomycin and Ceftaz. Ensure abdominal pain has resolved and he remains afebrile.  ESRD: Ensure patient has followed up with nephrology regarding need for HD vs continuing PD. Please ensure PD sessions are going well and that he is not volume-up again DM2: He was hyperglycemic to the 500's during admission. Please check HbA1c and adjust insulin as needed.   2.  Labs / imaging needed at time of follow-up: none.  3.  Pending labs/ test needing follow-up: none.  Hospital Course by problem list: Principal Problem:   Peritonitis associated with peritoneal dialysis [facility]) Active Problems:   Hypothyroidism (acquired)   Diabetic neuropathy (HCC)   Uncontrolled diabetes mellitus type 2 with peripheral artery disease (HCC)   Essential hypertension, benign   ESRD on dialysis (HCC)   S/P CABG (coronary artery bypass graft)   Abdominal pain   1. Peritonitis associated with peritoneal  dialysis Mr. Rigel is a 46 year old male with ESRD recently started on PD who presented for evaluation of 10/10 abdominal pain. He was also volume overloaded on presentation as well due to under response from PD. Peritoneal fluid was obtained which showed a cell count of 300 with neutrophil predominance. Gram stain did not show organisms. Case was discussed with nephrology who recommended vancomycin and fortaz. He was subsequently discharged following HD with close follow-up outpatient. He received HD 03/09/16.   2. Hyperglycemia associated with Type 2 Diabetes CBG 300-500 during admission. He was restarted on his home dose of lantus 30 units BID + resistant sliding scale with improvement of his hyperglycemia.   Discharge Vitals:   BP 139/86 (BP Location: Right Arm)   Pulse 99   Temp 98.3 F (36.8 C) (Oral)   Resp 15  Wt 263 lb 3.7 oz (119.4 kg)   SpO2 100%   BMI 33.80 kg/m   Pertinent Labs, Studies, and Procedures:  Peritoneal Fluid: 300 WBC, 74% neutrophils.  Gram stain and culture: Negative  Discharge Instructions: You were admitted for an infection in your abdomen. For this, you have been prescribed 2 antibiotics which will be taken during dialysis sessions. Please complete your course of antibiotics. You will need to follow-up with your nephrologist shortly after discharge to discuss continuing PD vs starting HD. As always, call your primary care physician or go to ED if your symptoms were to return.   SignedNoemi Chapel: Meshilem Machuca, DO 03/20/2016, 2:31 PM   Pager: [phone]

## 2016-03-21 DIAGNOSIS — R52 Pain, unspecified: Secondary | ICD-10-CM | POA: Diagnosis not present

## 2016-03-21 DIAGNOSIS — N2581 Secondary hyperparathyroidism of renal origin: Secondary | ICD-10-CM | POA: Diagnosis not present

## 2016-03-21 DIAGNOSIS — N186 End stage renal disease: Secondary | ICD-10-CM | POA: Diagnosis not present

## 2016-03-22 DIAGNOSIS — N2581 Secondary hyperparathyroidism of renal origin: Secondary | ICD-10-CM | POA: Diagnosis not present

## 2016-03-22 DIAGNOSIS — R52 Pain, unspecified: Secondary | ICD-10-CM | POA: Diagnosis not present

## 2016-03-22 DIAGNOSIS — N186 End stage renal disease: Secondary | ICD-10-CM | POA: Diagnosis not present

## 2016-03-23 DIAGNOSIS — N2581 Secondary hyperparathyroidism of renal origin: Secondary | ICD-10-CM | POA: Diagnosis not present

## 2016-03-23 DIAGNOSIS — R52 Pain, unspecified: Secondary | ICD-10-CM | POA: Diagnosis not present

## 2016-03-23 DIAGNOSIS — N186 End stage renal disease: Secondary | ICD-10-CM | POA: Diagnosis not present

## 2016-03-24 DIAGNOSIS — R52 Pain, unspecified: Secondary | ICD-10-CM | POA: Diagnosis not present

## 2016-03-24 DIAGNOSIS — N2581 Secondary hyperparathyroidism of renal origin: Secondary | ICD-10-CM | POA: Diagnosis not present

## 2016-03-24 DIAGNOSIS — N186 End stage renal disease: Secondary | ICD-10-CM | POA: Diagnosis not present

## 2016-03-25 DIAGNOSIS — N186 End stage renal disease: Secondary | ICD-10-CM | POA: Diagnosis not present

## 2016-03-25 DIAGNOSIS — R52 Pain, unspecified: Secondary | ICD-10-CM | POA: Diagnosis not present

## 2016-03-25 DIAGNOSIS — N2581 Secondary hyperparathyroidism of renal origin: Secondary | ICD-10-CM | POA: Diagnosis not present

## 2016-03-26 DIAGNOSIS — N186 End stage renal disease: Secondary | ICD-10-CM | POA: Diagnosis not present

## 2016-03-26 DIAGNOSIS — N2581 Secondary hyperparathyroidism of renal origin: Secondary | ICD-10-CM | POA: Diagnosis not present

## 2016-03-26 DIAGNOSIS — R52 Pain, unspecified: Secondary | ICD-10-CM | POA: Diagnosis not present

## 2016-03-27 DIAGNOSIS — N2581 Secondary hyperparathyroidism of renal origin: Secondary | ICD-10-CM | POA: Diagnosis not present

## 2016-03-27 DIAGNOSIS — N186 End stage renal disease: Secondary | ICD-10-CM | POA: Diagnosis not present

## 2016-03-27 DIAGNOSIS — R52 Pain, unspecified: Secondary | ICD-10-CM | POA: Diagnosis not present

## 2016-03-28 DIAGNOSIS — N2581 Secondary hyperparathyroidism of renal origin: Secondary | ICD-10-CM | POA: Diagnosis not present

## 2016-03-28 DIAGNOSIS — N186 End stage renal disease: Secondary | ICD-10-CM | POA: Diagnosis not present

## 2016-03-28 DIAGNOSIS — R52 Pain, unspecified: Secondary | ICD-10-CM | POA: Diagnosis not present

## 2016-03-29 DIAGNOSIS — R52 Pain, unspecified: Secondary | ICD-10-CM | POA: Diagnosis not present

## 2016-03-29 DIAGNOSIS — N186 End stage renal disease: Secondary | ICD-10-CM | POA: Diagnosis not present

## 2016-03-29 DIAGNOSIS — N2581 Secondary hyperparathyroidism of renal origin: Secondary | ICD-10-CM | POA: Diagnosis not present

## 2016-03-30 DIAGNOSIS — N2581 Secondary hyperparathyroidism of renal origin: Secondary | ICD-10-CM | POA: Diagnosis not present

## 2016-03-30 DIAGNOSIS — N186 End stage renal disease: Secondary | ICD-10-CM | POA: Diagnosis not present

## 2016-03-30 DIAGNOSIS — R52 Pain, unspecified: Secondary | ICD-10-CM | POA: Diagnosis not present

## 2016-03-31 DIAGNOSIS — N2581 Secondary hyperparathyroidism of renal origin: Secondary | ICD-10-CM | POA: Diagnosis not present

## 2016-03-31 DIAGNOSIS — N186 End stage renal disease: Secondary | ICD-10-CM | POA: Diagnosis not present

## 2016-03-31 DIAGNOSIS — R52 Pain, unspecified: Secondary | ICD-10-CM | POA: Diagnosis not present

## 2016-04-01 DIAGNOSIS — Z992 Dependence on renal dialysis: Secondary | ICD-10-CM | POA: Diagnosis not present

## 2016-04-01 DIAGNOSIS — N2581 Secondary hyperparathyroidism of renal origin: Secondary | ICD-10-CM | POA: Diagnosis not present

## 2016-04-01 DIAGNOSIS — N186 End stage renal disease: Secondary | ICD-10-CM | POA: Diagnosis not present

## 2016-04-01 DIAGNOSIS — R52 Pain, unspecified: Secondary | ICD-10-CM | POA: Diagnosis not present

## 2016-04-01 DIAGNOSIS — N049 Nephrotic syndrome with unspecified morphologic changes: Secondary | ICD-10-CM | POA: Diagnosis not present

## 2016-04-02 DIAGNOSIS — E1129 Type 2 diabetes mellitus with other diabetic kidney complication: Secondary | ICD-10-CM | POA: Diagnosis not present

## 2016-04-02 DIAGNOSIS — N186 End stage renal disease: Secondary | ICD-10-CM | POA: Diagnosis not present

## 2016-04-02 DIAGNOSIS — N2581 Secondary hyperparathyroidism of renal origin: Secondary | ICD-10-CM | POA: Diagnosis not present

## 2016-04-02 DIAGNOSIS — R17 Unspecified jaundice: Secondary | ICD-10-CM | POA: Diagnosis not present

## 2016-04-02 DIAGNOSIS — Z79899 Other long term (current) drug therapy: Secondary | ICD-10-CM | POA: Diagnosis not present

## 2016-04-02 DIAGNOSIS — D509 Iron deficiency anemia, unspecified: Secondary | ICD-10-CM | POA: Diagnosis not present

## 2016-04-02 DIAGNOSIS — E440 Moderate protein-calorie malnutrition: Secondary | ICD-10-CM | POA: Diagnosis not present

## 2016-04-02 DIAGNOSIS — N2589 Other disorders resulting from impaired renal tubular function: Secondary | ICD-10-CM | POA: Diagnosis not present

## 2016-04-03 DIAGNOSIS — E1129 Type 2 diabetes mellitus with other diabetic kidney complication: Secondary | ICD-10-CM | POA: Diagnosis not present

## 2016-04-03 DIAGNOSIS — D509 Iron deficiency anemia, unspecified: Secondary | ICD-10-CM | POA: Diagnosis not present

## 2016-04-03 DIAGNOSIS — N2589 Other disorders resulting from impaired renal tubular function: Secondary | ICD-10-CM | POA: Diagnosis not present

## 2016-04-03 DIAGNOSIS — N2581 Secondary hyperparathyroidism of renal origin: Secondary | ICD-10-CM | POA: Diagnosis not present

## 2016-04-03 DIAGNOSIS — N186 End stage renal disease: Secondary | ICD-10-CM | POA: Diagnosis not present

## 2016-04-03 DIAGNOSIS — E440 Moderate protein-calorie malnutrition: Secondary | ICD-10-CM | POA: Diagnosis not present

## 2016-04-04 DIAGNOSIS — E440 Moderate protein-calorie malnutrition: Secondary | ICD-10-CM | POA: Diagnosis not present

## 2016-04-04 DIAGNOSIS — D509 Iron deficiency anemia, unspecified: Secondary | ICD-10-CM | POA: Diagnosis not present

## 2016-04-04 DIAGNOSIS — N2589 Other disorders resulting from impaired renal tubular function: Secondary | ICD-10-CM | POA: Diagnosis not present

## 2016-04-04 DIAGNOSIS — N2581 Secondary hyperparathyroidism of renal origin: Secondary | ICD-10-CM | POA: Diagnosis not present

## 2016-04-04 DIAGNOSIS — E1129 Type 2 diabetes mellitus with other diabetic kidney complication: Secondary | ICD-10-CM | POA: Diagnosis not present

## 2016-04-04 DIAGNOSIS — N186 End stage renal disease: Secondary | ICD-10-CM | POA: Diagnosis not present

## 2016-04-04 LAB — BLOOD GAS, VENOUS

## 2016-04-05 DIAGNOSIS — N186 End stage renal disease: Secondary | ICD-10-CM | POA: Diagnosis not present

## 2016-04-05 DIAGNOSIS — N2581 Secondary hyperparathyroidism of renal origin: Secondary | ICD-10-CM | POA: Diagnosis not present

## 2016-04-05 DIAGNOSIS — N2589 Other disorders resulting from impaired renal tubular function: Secondary | ICD-10-CM | POA: Diagnosis not present

## 2016-04-05 DIAGNOSIS — D509 Iron deficiency anemia, unspecified: Secondary | ICD-10-CM | POA: Diagnosis not present

## 2016-04-05 DIAGNOSIS — E440 Moderate protein-calorie malnutrition: Secondary | ICD-10-CM | POA: Diagnosis not present

## 2016-04-05 DIAGNOSIS — E1129 Type 2 diabetes mellitus with other diabetic kidney complication: Secondary | ICD-10-CM | POA: Diagnosis not present

## 2016-04-06 DIAGNOSIS — E440 Moderate protein-calorie malnutrition: Secondary | ICD-10-CM | POA: Diagnosis not present

## 2016-04-06 DIAGNOSIS — E1129 Type 2 diabetes mellitus with other diabetic kidney complication: Secondary | ICD-10-CM | POA: Diagnosis not present

## 2016-04-06 DIAGNOSIS — N186 End stage renal disease: Secondary | ICD-10-CM | POA: Diagnosis not present

## 2016-04-06 DIAGNOSIS — N2581 Secondary hyperparathyroidism of renal origin: Secondary | ICD-10-CM | POA: Diagnosis not present

## 2016-04-06 DIAGNOSIS — N2589 Other disorders resulting from impaired renal tubular function: Secondary | ICD-10-CM | POA: Diagnosis not present

## 2016-04-06 DIAGNOSIS — D509 Iron deficiency anemia, unspecified: Secondary | ICD-10-CM | POA: Diagnosis not present

## 2016-04-07 DIAGNOSIS — N186 End stage renal disease: Secondary | ICD-10-CM | POA: Diagnosis not present

## 2016-04-07 DIAGNOSIS — E1129 Type 2 diabetes mellitus with other diabetic kidney complication: Secondary | ICD-10-CM | POA: Diagnosis not present

## 2016-04-07 DIAGNOSIS — N2589 Other disorders resulting from impaired renal tubular function: Secondary | ICD-10-CM | POA: Diagnosis not present

## 2016-04-07 DIAGNOSIS — E440 Moderate protein-calorie malnutrition: Secondary | ICD-10-CM | POA: Diagnosis not present

## 2016-04-07 DIAGNOSIS — D509 Iron deficiency anemia, unspecified: Secondary | ICD-10-CM | POA: Diagnosis not present

## 2016-04-07 DIAGNOSIS — N2581 Secondary hyperparathyroidism of renal origin: Secondary | ICD-10-CM | POA: Diagnosis not present

## 2016-04-08 DIAGNOSIS — N186 End stage renal disease: Secondary | ICD-10-CM | POA: Diagnosis not present

## 2016-04-08 DIAGNOSIS — E1129 Type 2 diabetes mellitus with other diabetic kidney complication: Secondary | ICD-10-CM | POA: Diagnosis not present

## 2016-04-08 DIAGNOSIS — N2581 Secondary hyperparathyroidism of renal origin: Secondary | ICD-10-CM | POA: Diagnosis not present

## 2016-04-08 DIAGNOSIS — E440 Moderate protein-calorie malnutrition: Secondary | ICD-10-CM | POA: Diagnosis not present

## 2016-04-08 DIAGNOSIS — N2589 Other disorders resulting from impaired renal tubular function: Secondary | ICD-10-CM | POA: Diagnosis not present

## 2016-04-08 DIAGNOSIS — D509 Iron deficiency anemia, unspecified: Secondary | ICD-10-CM | POA: Diagnosis not present

## 2016-04-09 DIAGNOSIS — N2581 Secondary hyperparathyroidism of renal origin: Secondary | ICD-10-CM | POA: Diagnosis not present

## 2016-04-09 DIAGNOSIS — E1129 Type 2 diabetes mellitus with other diabetic kidney complication: Secondary | ICD-10-CM | POA: Diagnosis not present

## 2016-04-09 DIAGNOSIS — E440 Moderate protein-calorie malnutrition: Secondary | ICD-10-CM | POA: Diagnosis not present

## 2016-04-09 DIAGNOSIS — N186 End stage renal disease: Secondary | ICD-10-CM | POA: Diagnosis not present

## 2016-04-09 DIAGNOSIS — N2589 Other disorders resulting from impaired renal tubular function: Secondary | ICD-10-CM | POA: Diagnosis not present

## 2016-04-09 DIAGNOSIS — D509 Iron deficiency anemia, unspecified: Secondary | ICD-10-CM | POA: Diagnosis not present

## 2016-04-10 ENCOUNTER — Telehealth: Payer: Self-pay | Admitting: Cardiology

## 2016-04-10 DIAGNOSIS — E440 Moderate protein-calorie malnutrition: Secondary | ICD-10-CM | POA: Diagnosis not present

## 2016-04-10 DIAGNOSIS — N186 End stage renal disease: Secondary | ICD-10-CM | POA: Diagnosis not present

## 2016-04-10 DIAGNOSIS — D509 Iron deficiency anemia, unspecified: Secondary | ICD-10-CM | POA: Diagnosis not present

## 2016-04-10 DIAGNOSIS — N2589 Other disorders resulting from impaired renal tubular function: Secondary | ICD-10-CM | POA: Diagnosis not present

## 2016-04-10 DIAGNOSIS — N2581 Secondary hyperparathyroidism of renal origin: Secondary | ICD-10-CM | POA: Diagnosis not present

## 2016-04-10 DIAGNOSIS — E1129 Type 2 diabetes mellitus with other diabetic kidney complication: Secondary | ICD-10-CM | POA: Diagnosis not present

## 2016-04-10 NOTE — Telephone Encounter (Signed)
Patient called in reporting active chest pain x 3 days. He voiced onset of nausea and sternal pressure Friday. Radiating pain to left arm. Voiced that he was at [facility] over the weekend and did not seek treatment in hospital or urgent care there. Was waiting to come back to [facility] to see provider here.  Pt states pain unresolved, c/o continued active chest pain today. He has taken  ASA this AM. Does not have nitro.  Wife has him in car and wanting to know where to go. I advised to chew 1-3 additional tabs of ASA and go to ER at [facility]. Pt voiced agreement.  Trish, cardmaster at [facility] has been notified.  Routed for [facility]

## 2016-04-10 NOTE — Telephone Encounter (Signed)
Follow up   Pt c/o of Chest Pain: STAT if CP now or developed within 24 hours  1. Are you having CP right now? Yes and pain down arm  2. Are you experiencing any other symptoms (ex. SOB, nausea, vomiting, sweating)? SOB   3. How long have you been experiencing CP? For 2 days   4. Is your CP continuous or coming and going? Coming and going   5. Have you taken Nitroglycerin? Pt does not have any. ?

## 2016-04-10 NOTE — Telephone Encounter (Signed)
Pt c/o Shortness Of Breath: STAT if SOB developed within the last 24 hours or pt is noticeably SOB on the phone  1. Are you currently SOB (can you hear that pt is SOB on the phone)?yes  2. How long have you been experiencing SOB? 04-08-2014  3. Are you SOB when sitting or when up moving around? Sitting down 4. Are you currently experiencing any other symptoms? Chest pain

## 2016-04-11 DIAGNOSIS — D509 Iron deficiency anemia, unspecified: Secondary | ICD-10-CM | POA: Diagnosis not present

## 2016-04-11 DIAGNOSIS — N186 End stage renal disease: Secondary | ICD-10-CM | POA: Diagnosis not present

## 2016-04-11 DIAGNOSIS — N2589 Other disorders resulting from impaired renal tubular function: Secondary | ICD-10-CM | POA: Diagnosis not present

## 2016-04-11 DIAGNOSIS — E440 Moderate protein-calorie malnutrition: Secondary | ICD-10-CM | POA: Diagnosis not present

## 2016-04-11 DIAGNOSIS — E1129 Type 2 diabetes mellitus with other diabetic kidney complication: Secondary | ICD-10-CM | POA: Diagnosis not present

## 2016-04-11 DIAGNOSIS — N2581 Secondary hyperparathyroidism of renal origin: Secondary | ICD-10-CM | POA: Diagnosis not present

## 2016-04-12 DIAGNOSIS — N186 End stage renal disease: Secondary | ICD-10-CM | POA: Diagnosis not present

## 2016-04-12 DIAGNOSIS — E1129 Type 2 diabetes mellitus with other diabetic kidney complication: Secondary | ICD-10-CM | POA: Diagnosis not present

## 2016-04-12 DIAGNOSIS — N2581 Secondary hyperparathyroidism of renal origin: Secondary | ICD-10-CM | POA: Diagnosis not present

## 2016-04-12 DIAGNOSIS — D509 Iron deficiency anemia, unspecified: Secondary | ICD-10-CM | POA: Diagnosis not present

## 2016-04-12 DIAGNOSIS — N2589 Other disorders resulting from impaired renal tubular function: Secondary | ICD-10-CM | POA: Diagnosis not present

## 2016-04-12 DIAGNOSIS — E440 Moderate protein-calorie malnutrition: Secondary | ICD-10-CM | POA: Diagnosis not present

## 2016-04-13 DIAGNOSIS — D509 Iron deficiency anemia, unspecified: Secondary | ICD-10-CM | POA: Diagnosis not present

## 2016-04-13 DIAGNOSIS — E440 Moderate protein-calorie malnutrition: Secondary | ICD-10-CM | POA: Diagnosis not present

## 2016-04-13 DIAGNOSIS — E1129 Type 2 diabetes mellitus with other diabetic kidney complication: Secondary | ICD-10-CM | POA: Diagnosis not present

## 2016-04-13 DIAGNOSIS — N2581 Secondary hyperparathyroidism of renal origin: Secondary | ICD-10-CM | POA: Diagnosis not present

## 2016-04-13 DIAGNOSIS — N186 End stage renal disease: Secondary | ICD-10-CM | POA: Diagnosis not present

## 2016-04-13 DIAGNOSIS — N2589 Other disorders resulting from impaired renal tubular function: Secondary | ICD-10-CM | POA: Diagnosis not present

## 2016-04-14 DIAGNOSIS — N2581 Secondary hyperparathyroidism of renal origin: Secondary | ICD-10-CM | POA: Diagnosis not present

## 2016-04-14 DIAGNOSIS — E1129 Type 2 diabetes mellitus with other diabetic kidney complication: Secondary | ICD-10-CM | POA: Diagnosis not present

## 2016-04-14 DIAGNOSIS — E440 Moderate protein-calorie malnutrition: Secondary | ICD-10-CM | POA: Diagnosis not present

## 2016-04-14 DIAGNOSIS — E784 Other hyperlipidemia: Secondary | ICD-10-CM | POA: Diagnosis not present

## 2016-04-14 DIAGNOSIS — N186 End stage renal disease: Secondary | ICD-10-CM | POA: Diagnosis not present

## 2016-04-14 DIAGNOSIS — N2589 Other disorders resulting from impaired renal tubular function: Secondary | ICD-10-CM | POA: Diagnosis not present

## 2016-04-14 DIAGNOSIS — D509 Iron deficiency anemia, unspecified: Secondary | ICD-10-CM | POA: Diagnosis not present

## 2016-04-15 DIAGNOSIS — N2589 Other disorders resulting from impaired renal tubular function: Secondary | ICD-10-CM | POA: Diagnosis not present

## 2016-04-15 DIAGNOSIS — D509 Iron deficiency anemia, unspecified: Secondary | ICD-10-CM | POA: Diagnosis not present

## 2016-04-15 DIAGNOSIS — E1129 Type 2 diabetes mellitus with other diabetic kidney complication: Secondary | ICD-10-CM | POA: Diagnosis not present

## 2016-04-15 DIAGNOSIS — E440 Moderate protein-calorie malnutrition: Secondary | ICD-10-CM | POA: Diagnosis not present

## 2016-04-15 DIAGNOSIS — N2581 Secondary hyperparathyroidism of renal origin: Secondary | ICD-10-CM | POA: Diagnosis not present

## 2016-04-15 DIAGNOSIS — N186 End stage renal disease: Secondary | ICD-10-CM | POA: Diagnosis not present

## 2016-04-16 DIAGNOSIS — N2589 Other disorders resulting from impaired renal tubular function: Secondary | ICD-10-CM | POA: Diagnosis not present

## 2016-04-16 DIAGNOSIS — E1129 Type 2 diabetes mellitus with other diabetic kidney complication: Secondary | ICD-10-CM | POA: Diagnosis not present

## 2016-04-16 DIAGNOSIS — N186 End stage renal disease: Secondary | ICD-10-CM | POA: Diagnosis not present

## 2016-04-16 DIAGNOSIS — N2581 Secondary hyperparathyroidism of renal origin: Secondary | ICD-10-CM | POA: Diagnosis not present

## 2016-04-16 DIAGNOSIS — E440 Moderate protein-calorie malnutrition: Secondary | ICD-10-CM | POA: Diagnosis not present

## 2016-04-16 DIAGNOSIS — D509 Iron deficiency anemia, unspecified: Secondary | ICD-10-CM | POA: Diagnosis not present

## 2016-04-17 ENCOUNTER — Ambulatory Visit (INDEPENDENT_AMBULATORY_CARE_PROVIDER_SITE_OTHER): Payer: Medicare Other | Admitting: Family Medicine

## 2016-04-17 VITALS — BP 140/90 | HR 114 | Temp 99.0°F | Resp 17 | Ht 74.0 in | Wt 274.0 lb

## 2016-04-17 DIAGNOSIS — E785 Hyperlipidemia, unspecified: Secondary | ICD-10-CM | POA: Diagnosis not present

## 2016-04-17 DIAGNOSIS — E440 Moderate protein-calorie malnutrition: Secondary | ICD-10-CM | POA: Diagnosis not present

## 2016-04-17 DIAGNOSIS — N2581 Secondary hyperparathyroidism of renal origin: Secondary | ICD-10-CM | POA: Diagnosis not present

## 2016-04-17 DIAGNOSIS — I10 Essential (primary) hypertension: Secondary | ICD-10-CM

## 2016-04-17 DIAGNOSIS — I2510 Atherosclerotic heart disease of native coronary artery without angina pectoris: Secondary | ICD-10-CM | POA: Diagnosis not present

## 2016-04-17 DIAGNOSIS — N186 End stage renal disease: Secondary | ICD-10-CM | POA: Diagnosis not present

## 2016-04-17 DIAGNOSIS — M5412 Radiculopathy, cervical region: Secondary | ICD-10-CM

## 2016-04-17 DIAGNOSIS — E1129 Type 2 diabetes mellitus with other diabetic kidney complication: Secondary | ICD-10-CM | POA: Diagnosis not present

## 2016-04-17 DIAGNOSIS — D509 Iron deficiency anemia, unspecified: Secondary | ICD-10-CM | POA: Diagnosis not present

## 2016-04-17 DIAGNOSIS — N2589 Other disorders resulting from impaired renal tubular function: Secondary | ICD-10-CM | POA: Diagnosis not present

## 2016-04-17 MED ORDER — ATORVASTATIN CALCIUM 80 MG PO TABS: 80.0000 mg | ORAL_TABLET | Freq: Every day | ORAL | 1 refills | Status: DC

## 2016-04-17 MED ORDER — AMLODIPINE BESYLATE 10 MG PO TABS: 10.0000 mg | ORAL_TABLET | Freq: Every day | ORAL | 1 refills | Status: DC

## 2016-04-17 MED ORDER — METOPROLOL TARTRATE 50 MG PO TABS: 50.0000 mg | ORAL_TABLET | Freq: Two times a day (BID) | ORAL | 1 refills | Status: DC

## 2016-04-17 NOTE — Progress Notes (Signed)
By signing my name below, I, Mesha Guinyard, attest that this documentation has been prepared under the direction and in the presence of Meredith Staggers, MD.  Electronically Signed: Arvilla Market, Medical Scribe. 04/17/16. 8:33 AM.  Subjective:    Patient ID: Russell [name], male    DOB: 02-24-1970, 46 y.o.   MRN: [id number]  HPI Chief Complaint  Patient presents with  . Medication Refill    blood pressure and cholesterol medicine     HPI Comments: Russell [name] is a 46 y.o. male who presents to the Primary Care at [facility] and [facility] for medication refill for his HTN and cholesterol. I saw him once in 2015 for wrist pain. He has a history of multiple medical problems including coronary disease, status post CABG, and ESRD on dialysis, DM with PAD and neuropathy, hypothyroidism, and HTN.  DM: Followed by Dr. Elvera Lennox. Lab Results  Component Value Date   HGBA1C 12.5 (H) 09/28/2015   Lab Results  Component Value Date   MICROALBUR 24.84 (H) 12/09/2012   ESRD on dialysis: He's on peritoneal dialysis. Nephrologist is Dr. Lacy Duverney  CAD/Post CABG: CABG was 09/2015. Cardiologist Dr. Jens Som, last office vsit 01/27/16 continue on ASA, plavix, and statin. Planning on discontinuing plavix at his next visit in 6 months. Pt is compliant with his medication. Denies chest pain, SOB, bruising, or acute side effects from his medication. Lab Results  Component Value Date   CHOL 203 (H) 09/21/2015   HDL 33 (L) 09/21/2015   LDLCALC 111 (H) 09/21/2015   TRIG 294 (H) 09/21/2015   CHOLHDL 6.2 09/21/2015   Lab Results  Component Value Date   ALT 13 (L) 03/09/2016   AST 14 (L) 03/09/2016   ALKPHOS 118 03/09/2016   BILITOT 0.6 03/09/2016   HTN: BP was controlled at his 01/27/16 visit with cardiology at 112/75. Pt ran out of his medication, but was compliant with metoprolol , and amlodipine 10 mg QD. Pt reports leg swelling, but after he completes dialysis his swelling resolves.  Denies light-headedness, dizziness, chest pain, or other acute side effects.  Lab Results  Component Value Date   CREATININE 8.59 (H) 03/10/2016   Myalgias: Reports radiating left shoulder myalgias onset 3 days ago when pt was laying in bed while dialysis. Reports associated sxs of neck pain, numbness, and occasional radiating in his arm. Describes the pain as a burning pulled muscle and it initially occurred when he was laying in the bed while on dialysis. Denies chest pain, SOB, and bruising.able to Reproduce pain with movement. No attempted Treatments.  Patient Active Problem List   Diagnosis Date Noted  . Peritonitis associated with peritoneal dialysis (HCC) 03/10/2016  . Abdominal pain 03/09/2016  . Hyperkalemia   . S/P CABG (coronary artery bypass graft) 09/28/2015  . Coronary artery disease involving native coronary artery of native heart with unstable angina pectoris (HCC)   . Hyperglycemia due to type 2 diabetes mellitus (HCC) 09/20/2015  . NSTEMI (non-ST elevated myocardial infarction) (HCC)   . Elevated troponin   . Angina pectoris (HCC) 07/20/2015  . Precordial pain 07/20/2015  . Hyperglycemia 07/20/2015  . DKA (diabetic ketoacidoses) (HCC) 02/13/2015  . ESRD on dialysis (HCC) 02/13/2015  . Nausea and vomiting 02/13/2015  . Nephrotic syndrome 01/03/2015  . Hypertensive urgency 12/30/2014  . Essential hypertension, benign   . Thyroid activity decreased   . Hx of gastroesophageal reflux (GERD) 09/30/2014  . Prolonged Q-T interval on ECG 09/30/2014  . Acute on chronic  renal failure (HCC) 09/24/2014  . Diabetic neuropathy (HCC) 09/21/2014  . Uncontrolled diabetes mellitus type 2 with peripheral artery disease (HCC) 09/21/2014  . Morbid obesity (HCC) 09/21/2014  . Superficial thrombophlebitis 03/24/2014  . Hypothyroidism (acquired) 03/24/2014   Past Medical History:  Diagnosis Date  . Age-related macular degeneration, wet, both eyes (HCC)    "I'm getting Russell [name]  treatments" (12/30/2014)  . Anxiety   . Asthma   . 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 Daily headache   . Depression   . DVT (deep venous thrombosis), H/o 01/2014-on Xarelto 03/24/2014   LLE  . Hypertension   . Hypothyroidism   . Nephrotic syndrome 05/18/2014  . NSTEMI (non-ST elevated myocardial infarction) (HCC)   . Pneumonia 11/2013  . Renal insufficiency   . Secondary DM with DKA-AG=16, BIcarb Nl 03/24/2014  . Type 2 diabetes mellitus with diabetic nephropathy [facility])    Past Surgical History:  Procedure Laterality Date  . ANKLE FRACTURE SURGERY Right 1988  . AV FISTULA PLACEMENT Left 01/01/2015   Procedure: CREATION OF LEFT RADIAL CEPHALIC ARTERIOVENOUS (AV) FISTULA ;  Surgeon: Pryor Ochoa, MD;  Location: [facility] OR;  Service: Vascular;  Laterality: Left;  . CARDIAC CATHETERIZATION N/A 09/22/2015   Procedure: Left Heart Cath and Coronary Angiography;  Surgeon: Iran Ouch, MD;  Location: [facility] INVASIVE CV LAB;  Service: Cardiovascular;  Laterality: N/A;  . CORONARY ARTERY BYPASS GRAFT N/A 09/28/2015   Procedure: CORONARY ARTERY BYPASS GRAFTING (CABG) x 3 UTILIZING LEFT MAMMARY ARTERY AND ENDOSCOPICALLY HARVESTED LEFT GREATER SAPHENOUS VEIN.;  Surgeon: Delight Ovens, MD;  Location: [facility] OR;  Service: Open Heart Surgery;  Laterality: N/A;  . ENDOVEIN HARVEST OF GREATER SAPHENOUS VEIN Left 09/28/2015   Procedure: ENDOVEIN HARVEST OF GREATER SAPHENOUS VEIN;  Surgeon: Delight Ovens, MD;  Location[facility] OR;  Service: Open Heart Surgery;  Laterality: Left;  . ESOPHAGOGASTRODUODENOSCOPY N/A 03/29/2014   Procedure: ESOPHAGOGASTRODUODENOSCOPY (EGD);  Surgeon: Dorena Cookey, MD;  Location: [facility];  Service: Endoscopy;  Laterality: N/A;  . EYE SURGERY    . FRACTURE SURGERY    . INSERTION OF  DIALYSIS CATHETER Right 01/05/2015   Procedure: INSERTION OF RIGHT INTERNAL JUGULAR DIALYSIS CATHETER;  Surgeon: Fransisco Hertz, MD;  Location: [facility] OR;  Service: Vascular;  Laterality: Right;  . TEE WITHOUT CARDIOVERSION N/A 09/28/2015   Procedure: TRANSESOPHAGEAL ECHOCARDIOGRAM (TEE);  Surgeon: Delight Ovens, MD;  Location: [facility] OR;  Service: Open Heart Surgery;  Laterality: N/A;  . TONSILLECTOMY AND ADENOIDECTOMY  1970s   Allergies  Allergen Reactions  . Ibuprofen Other (See Comments)    MD told him not to take due to kidney disease.  . Nsaids Other (See Comments)    Told to avoid all nsaids due to kidney disease-takes acetaminophen as needed   Prior to Admission medications   Medication Sig Start Date End Date Taking? Authorizing Provider  acetaminophen (TYLENOL) 500 MG tablet Take 1 tablet (500 mg total) by mouth every 6 (six) hours as needed for moderate pain. Patient taking differently: Take 1,000 mg by mouth every 6 (six) hours as needed for moderate pain.  09/29/14  Yes Albertine Grates, MD  albuterol (PROVENTIL HFA;VENTOLIN HFA) 108 (90 Base) MCG/ACT inhaler Inhale 1 puff into the lungs every 6 (six) hours as  needed for wheezing or shortness of breath. 03/11/15  Yes [name], MD  albuterol (PROVENTIL) (2.5 MG/3ML) 0.083% nebulizer solution Take 2.5 mg by nebulization every 6 (six) hours as needed for wheezing.   Yes Historical Provider, MD  albuterol-ipratropium (COMBIVENT) 18-103 MCG/ACT inhaler Inhale 2 puffs into the lungs every 4 (four) hours. Patient taking differently: Inhale 2 puffs into the lungs every 6 (six) hours as needed for wheezing or shortness of breath.  09/03/14  Yes [name], MD  amLODipine (NORVASC) 10 MG tablet Take 10 mg by mouth daily.   Yes Historical Provider, MD  aspirin EC 81 MG EC tablet Take 1 tablet (81 mg total) by mouth daily. Patient taking differently: Take 81 mg by mouth every morning.  10/06/15  Yes Erin R Barrett, PA-C  atorvastatin (LIPITOR) 80 MG  tablet Take 1 tablet (80 mg total) by mouth daily at 6 PM. 01/27/16  Yes Lewayne Bunting, MD  calcitRIOL (ROCALTROL) 0.25 MCG capsule Take 1 capsule (0.25 mcg total) by mouth every Monday, Wednesday, and Friday with hemodialysis. 10/06/15  Yes Erin R Barrett, PA-C  calcium acetate (PHOSLO) 667 MG capsule Take 2 capsules (1,334 mg total) by mouth 3 (three) times daily with meals. 01/08/15  Yes Rolly Salter, MD  cefTAZidime 2 g in dextrose 5 % 50 mL Inject 2 g into the vein every Monday, Wednesday, and Friday. WITH HD SESSIONS x 2 weeks 03/10/16  Yes Bethany Molt, DO  citalopram (CELEXA) 40 MG tablet Take 1.5 tablets (60 mg total) by mouth at bedtime. 05/22/15  Yes [name], MD  clonazePAM (KLONOPIN) 2 MG tablet Take 2 mg by mouth at bedtime.   Yes Historical Provider, MD  clopidogrel (PLAVIX) 75 MG tablet Take 1 tablet (75 mg total) by mouth daily. 01/27/16  Yes Lewayne Bunting, MD  cyclobenzaprine (FLEXERIL) 10 MG tablet Take 1 tablet (10 mg total) by mouth at bedtime. 05/22/15  Yes [name], MD  gabapentin (NEURONTIN) 100 MG capsule Take 2 capsules (200 mg total) by mouth 3 (three) times daily. Patient taking differently: Take 800 mg by mouth 2 (two) times daily.  05/22/15  Yes [name], MD  HYDROmorphone (DILAUDID) 2 MG tablet Take 1 tablet (2 mg total) by mouth every 8 (eight) hours as needed for severe pain. Patient taking differently: Take 2-4 mg by mouth every 8 (eight) hours as needed for severe pain.  05/22/15  Yes [name], MD  hydrOXYzine (ATARAX/VISTARIL) 25 MG tablet Take 25 mg by mouth 2 (two) times daily as needed for anxiety.    Yes Historical Provider, MD  insulin glargine (LANTUS) 100 UNIT/ML injection Inject 0.3 mLs (30 Units total) into the skin 2 (two) times daily. 03/10/16  Yes Bethany Molt, DO  insulin lispro (HUMALOG) 100 UNIT/ML injection Inject 0.15-0.3 mLs (15-30 Units total) into the skin every evening. Take one unit per 5 carbs depending on  dinner. Patient taking differently: Inject 15-30 Units into the skin 3 (three) times daily with meals. Take one unit per 5 carbs depending on dinner. 11/29/15  Yes Carlus Pavlov, MD  levothyroxine (SYNTHROID, LEVOTHROID) 50 MCG tablet Take 1 tablet (50 mcg total) by mouth daily. 05/22/15  Yes [name], MD  metoCLOPramide (REGLAN) 5 MG tablet Take 1 tablet (5 mg total) by mouth every 8 (eight) hours as needed for nausea or vomiting. 01/10/16  Yes Dwana Melena, DO  metoprolol tartrate (LOPRESSOR) 25 MG tablet Take 2 tablets (50 mg total) by mouth 2 (two) times  daily. Take 2 tablets ( 50 mg ) twice a day Patient taking differently: Take 12.5 mg by mouth 2 (two) times daily.  01/27/16  Yes Lewayne Bunting, MD  polyethylene glycol [facility]) packet Take 17 g by mouth daily. 07/20/15  Yes Srikar Sudini, MD  promethazine (PHENERGAN) 25 MG tablet Take 1 tablet (25 mg total) by mouth every 6 (six) hours as needed for nausea or vomiting. 01/08/16  Yes Kristen N Ward, DO  vancomycin 2,500 mg in sodium chloride 0.9 % 500 mL Inject 2,500 mg into the vein every Monday, Wednesday, and Friday with hemodialysis. 03/10/16  Yes Bethany Molt, DO   Social History   Social History  . Marital status: Single    Spouse name: N/A  . Number of children: N/A  . Years of education: N/A   Occupational History  .      Maintenance Curator   Social History Main Topics  . Smoking status: Never Smoker  . Smokeless tobacco: Never Used  . Alcohol use 0.0 oz/week  . Drug use: No  . Sexual activity: Not Currently   Other Topics Concern  . Not on file   Social History Narrative   Patient currently lives with his fiance   Works with facilities   Nonsmoker, nondrinker      Review of Systems  Constitutional: Negative for fatigue and unexpected weight change.  Eyes: Negative for visual disturbance.  Respiratory: Negative for cough, chest tightness and shortness of breath.   Cardiovascular: Positive for leg swelling.  Negative for chest pain and palpitations.  Gastrointestinal: Negative for abdominal pain and blood in stool.  Musculoskeletal: Positive for myalgias and neck pain.  Neurological: Positive for numbness. Negative for dizziness, light-headedness and headaches.  Hematological: Does not bruise/bleed easily.   Objective:  Physical Exam  Constitutional: He is oriented to person, place, and time. He appears well-developed and well-nourished.  HENT:  Head: Normocephalic and atraumatic.  Eyes: EOM are normal. Pupils are equal, round, and reactive to light.  Neck: No JVD present. Carotid bruit is not present.  Cardiovascular: Normal rate, regular rhythm and normal heart sounds.  Exam reveals no gallop and no friction rub.   No murmur heard. Pulmonary/Chest: Effort normal and breath sounds normal. No respiratory distress. He has no wheezes. He has no rales.  Musculoskeletal: He exhibits edema.  Tender along the left paraspinals on the neck Discomfort with left arm elevation; abduction/flexion Reproduction of pulling sensation with looking left Reproducible left anterior chest pain with palpation Trace pedal edema  Neurological: He is alert and oriented to person, place, and time.  Reflex Scores:      Tricep reflexes are 2+ on the right side and 2+ on the left side.      Bicep reflexes are 2+ on the right side and 2+ on the left side.      Brachioradialis reflexes are 2+ on the right side and 2+ on the left side. Skin: Skin is warm and dry.  Psychiatric: He has a normal mood and affect.  Vitals reviewed.   Vitals:   04/17/16 0827  BP: (!) 158/100  Pulse: (!) 114  Resp: 17  Temp: 99 F (37.2 C)  TempSrc: Oral  SpO2: 97%  Weight: 274 lb (124.3 kg)  Height:  (1.88 m)     Assessment & Plan:    AMAURI KEEFE is a 46 y.o. male Essential hypertension - Plan: amLODipine (NORVASC) 10 MG tablet, metoprolol tartrate (LOPRESSOR) 50 MG tablet  -  Elevated off of medication today, but  reports having control improved on meds and normal at cardiology visit on same regimen.  -recent lytes reviewed.  -Continue metoprolol 50 mg twice a day, amlodipine 10 mg daily, continue follow-up with nephrology.  Hyperlipidemia, unspecified hyperlipidemia type - Plan: atorvastatin (LIPITOR) 80 MG tablet, Hepatic Function Panel, Lipid panel  - Tolerating Lipitor, recheck lipid panel on that dose. Recheck liver function testing. Continue follow-up with cardiologist as planned for his CAD  Cervical radiculopathy  - Able to reproduce upper chest wall pain with palpation and neck/arm symptoms with cervical range of motion and palpation. Possible cervical radiculopathy with secondary myalgia. Heat, ice, gentle range of motion stretches, Flexeril if needed, Tylenol, recheck in 1 week if not improved, sooner if worse.  Meds ordered this encounter  Medications  . amLODipine (NORVASC) 10 MG tablet    Sig: Take 1 tablet (10 mg total) by mouth daily.    Dispense:  90 tablet    Refill:  1  . metoprolol tartrate (LOPRESSOR) 50 MG tablet    Sig: Take 1 tablet (50 mg total) by mouth 2 (two) times daily.    Dispense:  180 tablet    Refill:  1  . atorvastatin (LIPITOR) 80 MG tablet    Sig: Take 1 tablet (80 mg total) by mouth daily at 6 PM.    Dispense:  90 tablet    Refill:  1   Patient Instructions   Continue follow up with your kidney, diabetes/thyroid, and heart specialists as planned.   I will check your cholesterol and electrolytes. No med changes for now.   Your neck and arm symptoms appear to be due to a possible pinched nerve. You can try the Flexeril, call me if you need a refill. Tylenol if needed. Heat or ice and gentle range of motion. If not improving in the next 1 week, return for recheck.   Schedule a physical in the next few months.     Cervical Radiculopathy Cervical radiculopathy happens when a nerve in the neck (cervical nerve) is pinched or bruised. This condition can  develop because of an injury or as part of the normal aging process. Pressure on the cervical nerves can cause pain or numbness that runs from the neck all the way down into the arm and fingers. Usually, this condition gets better with rest. Treatment may be needed if the condition does not improve. What are the causes? This condition may be caused by:  Injury.  Slipped (herniated) disk.  Muscle tightness in the neck because of overuse.  Arthritis.  Breakdown or degeneration in the bones and joints of the spine (spondylosis) due to aging.  Bone spurs that may develop near the cervical nerves. What are the signs or symptoms? Symptoms of this condition include:  Pain that runs from the neck to the arm and hand. The pain can be severe or irritating. It may be worse when the neck is moved.  Numbness or weakness in the affected arm and hand. How is this diagnosed? This condition may be diagnosed based on symptoms, medical history, and a physical exam. You may also have tests, including:  X-rays.  CT scan.  MRI.  Electromyogram (EMG).  Nerve conduction tests. How is this treated? In many cases, treatment is not needed for this condition. With rest, the condition usually gets better over time. If treatment is needed, options may include:  Wearing a soft neck collar for short periods of time.  Physical therapy  to strengthen your neck muscles.  Medicines, such as NSAIDs, oral corticosteroids, or spinal injections.  Surgery. This may be needed if other treatments do not help. Various types of surgery may be done depending on the cause of your problems. Follow these instructions at home: Managing pain   Take over-the-counter and prescription medicines only as told by your health care provider.  If directed, apply ice to the affected area.  Put ice in a plastic bag.  Place a towel between your skin and the bag.  Leave the ice on for 20 minutes, 2-3 times per day.  If ice  does not help, you can try using heat. Take a warm shower or warm bath, or use a heat pack as told by your health care provider.  Try a gentle neck and shoulder massage to help relieve symptoms. Activity   Rest as needed. Follow instructions from your health care provider about any restrictions on activities.  Do stretching and strengthening exercises as told by your health care provider or physical therapist. General instructions   If you were given a soft collar, wear it as told by your health care provider.  Use a flat pillow when you sleep.  Keep all follow-up visits as told by your health care provider. This is important. Contact a health care provider if:  Your condition does not improve with treatment. Get help right away if:  Your pain gets much worse and cannot be controlled with medicines.  You have weakness or numbness in your hand, arm, face, or leg.  You have a high fever.  You have a stiff, rigid neck.  You lose control of your bowels or your bladder (have incontinence).  You have trouble with walking, balance, or speaking. This information is not intended to replace advice given to you by your health care provider. Make sure you discuss any questions you have with your health care provider. Document Released: 09/13/2000 Document Revised: 05/27/2015 Document Reviewed: 02/12/2014 Elsevier Interactive Patient Education  2017 [employer].    IF you received an x-ray today, you will receive an invoice from [facility]. Please contact [facility] at [phone] with questions or concerns regarding your invoice.   IF you received labwork today, you will receive an invoice from [facility]. Please contact [facility] at [phone] with questions or concerns regarding your invoice.   Our billing staff will not be able to assist you with questions regarding bills from these companies.  You will be contacted with the lab results as soon as they are  available. The fastest way to get your results is to activate your My Chart account. Instructions are located on the last page of this paperwork. If you have not heard from [place] regarding the results in 2 weeks, please contact this office.       I personally performed the services described in this documentation, which was scribed in my presence. The recorded information has been reviewed and considered for accuracy and completeness, addended by me as needed, and agree with information above.  Signed,   Meredith Staggers, MD Primary Care at [facility].  04/17/16 9:01 AM

## 2016-04-17 NOTE — Patient Instructions (Addendum)
Continue follow up with your kidney, diabetes/thyroid, and heart specialists as planned.   I will check your cholesterol and electrolytes. No med changes for now.   Your neck and arm symptoms appear to be due to a possible pinched nerve. You can try the Flexeril, call me if you need a refill. Tylenol if needed. Heat or ice and gentle range of motion. If not improving in the next 1 week, return for recheck.   Schedule a physical in the next few months.     Cervical Radiculopathy Cervical radiculopathy happens when a nerve in the neck (cervical nerve) is pinched or bruised. This condition can develop because of an injury or as part of the normal aging process. Pressure on the cervical nerves can cause pain or numbness that runs from the neck all the way down into the arm and fingers. Usually, this condition gets better with rest. Treatment may be needed if the condition does not improve. What are the causes? This condition may be caused by:  Injury.  Slipped (herniated) disk.  Muscle tightness in the neck because of overuse.  Arthritis.  Breakdown or degeneration in the bones and joints of the spine (spondylosis) due to aging.  Bone spurs that may develop near the cervical nerves. What are the signs or symptoms? Symptoms of this condition include:  Pain that runs from the neck to the arm and hand. The pain can be severe or irritating. It may be worse when the neck is moved.  Numbness or weakness in the affected arm and hand. How is this diagnosed? This condition may be diagnosed based on symptoms, medical history, and a physical exam. You may also have tests, including:  X-rays.  CT scan.  MRI.  Electromyogram (EMG).  Nerve conduction tests. How is this treated? In many cases, treatment is not needed for this condition. With rest, the condition usually gets better over time. If treatment is needed, options may include:  Wearing a soft neck collar for short periods of  time.  Physical therapy to strengthen your neck muscles.  Medicines, such as NSAIDs, oral corticosteroids, or spinal injections.  Surgery. This may be needed if other treatments do not help. Various types of surgery may be done depending on the cause of your problems. Follow these instructions at home: Managing pain   Take over-the-counter and prescription medicines only as told by your health care provider.  If directed, apply ice to the affected area.  Put ice in a plastic bag.  Place a towel between your skin and the bag.  Leave the ice on for 20 minutes, 2-3 times per day.  If ice does not help, you can try using heat. Take a warm shower or warm bath, or use a heat pack as told by your health care provider.  Try a gentle neck and shoulder massage to help relieve symptoms. Activity   Rest as needed. Follow instructions from your health care provider about any restrictions on activities.  Do stretching and strengthening exercises as told by your health care provider or physical therapist. General instructions   If you were given a soft collar, wear it as told by your health care provider.  Use a flat pillow when you sleep.  Keep all follow-up visits as told by your health care provider. This is important. Contact a health care provider if:  Your condition does not improve with treatment. Get help right away if:  Your pain gets much worse and cannot be controlled with medicines.  You have weakness or numbness in your hand, arm, face, or leg.  You have a high fever.  You have a stiff, rigid neck.  You lose control of your bowels or your bladder (have incontinence).  You have trouble with walking, balance, or speaking. This information is not intended to replace advice given to you by your health care provider. Make sure you discuss any questions you have with your health care provider. Document Released: 09/13/2000 Document Revised: 05/27/2015 Document Reviewed:  02/12/2014 Elsevier Interactive Patient Education  2017 [employer].    IF you received an x-ray today, you will receive an invoice from [facility]. Please contact [facility] at [phone] with questions or concerns regarding your invoice.   IF you received labwork today, you will receive an invoice from [facility]. Please contact [facility] at [phone] with questions or concerns regarding your invoice.   Our billing staff will not be able to assist you with questions regarding bills from these companies.  You will be contacted with the lab results as soon as they are available. The fastest way to get your results is to activate your My Chart account. Instructions are located on the last page of this paperwork. If you have not heard from [place] regarding the results in 2 weeks, please contact this office.

## 2016-04-18 DIAGNOSIS — E1129 Type 2 diabetes mellitus with other diabetic kidney complication: Secondary | ICD-10-CM | POA: Diagnosis not present

## 2016-04-18 DIAGNOSIS — D509 Iron deficiency anemia, unspecified: Secondary | ICD-10-CM | POA: Diagnosis not present

## 2016-04-18 DIAGNOSIS — E440 Moderate protein-calorie malnutrition: Secondary | ICD-10-CM | POA: Diagnosis not present

## 2016-04-18 DIAGNOSIS — N2581 Secondary hyperparathyroidism of renal origin: Secondary | ICD-10-CM | POA: Diagnosis not present

## 2016-04-18 DIAGNOSIS — N2589 Other disorders resulting from impaired renal tubular function: Secondary | ICD-10-CM | POA: Diagnosis not present

## 2016-04-18 DIAGNOSIS — N186 End stage renal disease: Secondary | ICD-10-CM | POA: Diagnosis not present

## 2016-04-18 LAB — LIPID PANEL
Chol/HDL Ratio: 6.9 ratio — ABNORMAL HIGH (ref 0.0–5.0)
Cholesterol, Total: 228 mg/dL — ABNORMAL HIGH (ref 100–199)
HDL: 33 mg/dL — AB (ref >39)
LDL Calculated: 126 mg/dL — ABNORMAL HIGH (ref 0–99)
Triglycerides: 345 mg/dL — ABNORMAL HIGH (ref 0–149)
VLDL CHOLESTEROL CAL: 69 mg/dL — AB (ref 5–40)

## 2016-04-18 LAB — HEPATIC FUNCTION PANEL
ALBUMIN: 3.6 g/dL (ref 3.5–5.5)
ALT: 16 IU/L (ref 0–44)
AST: 21 IU/L (ref 0–40)
Alkaline Phosphatase: 127 IU/L — ABNORMAL HIGH (ref 39–117)
BILIRUBIN TOTAL: 0.2 mg/dL (ref 0.0–1.2)
BILIRUBIN, DIRECT: 0.08 mg/dL (ref 0.00–0.40)
Total Protein: 6 g/dL (ref 6.0–8.5)

## 2016-04-19 DIAGNOSIS — N186 End stage renal disease: Secondary | ICD-10-CM | POA: Diagnosis not present

## 2016-04-19 DIAGNOSIS — N2581 Secondary hyperparathyroidism of renal origin: Secondary | ICD-10-CM | POA: Diagnosis not present

## 2016-04-19 DIAGNOSIS — E440 Moderate protein-calorie malnutrition: Secondary | ICD-10-CM | POA: Diagnosis not present

## 2016-04-19 DIAGNOSIS — N2589 Other disorders resulting from impaired renal tubular function: Secondary | ICD-10-CM | POA: Diagnosis not present

## 2016-04-19 DIAGNOSIS — E1129 Type 2 diabetes mellitus with other diabetic kidney complication: Secondary | ICD-10-CM | POA: Diagnosis not present

## 2016-04-19 DIAGNOSIS — D509 Iron deficiency anemia, unspecified: Secondary | ICD-10-CM | POA: Diagnosis not present

## 2016-04-20 DIAGNOSIS — D509 Iron deficiency anemia, unspecified: Secondary | ICD-10-CM | POA: Diagnosis not present

## 2016-04-20 DIAGNOSIS — N2589 Other disorders resulting from impaired renal tubular function: Secondary | ICD-10-CM | POA: Diagnosis not present

## 2016-04-20 DIAGNOSIS — N186 End stage renal disease: Secondary | ICD-10-CM | POA: Diagnosis not present

## 2016-04-20 DIAGNOSIS — E1129 Type 2 diabetes mellitus with other diabetic kidney complication: Secondary | ICD-10-CM | POA: Diagnosis not present

## 2016-04-20 DIAGNOSIS — E440 Moderate protein-calorie malnutrition: Secondary | ICD-10-CM | POA: Diagnosis not present

## 2016-04-20 DIAGNOSIS — N2581 Secondary hyperparathyroidism of renal origin: Secondary | ICD-10-CM | POA: Diagnosis not present

## 2016-04-21 DIAGNOSIS — D509 Iron deficiency anemia, unspecified: Secondary | ICD-10-CM | POA: Diagnosis not present

## 2016-04-21 DIAGNOSIS — N2589 Other disorders resulting from impaired renal tubular function: Secondary | ICD-10-CM | POA: Diagnosis not present

## 2016-04-21 DIAGNOSIS — E440 Moderate protein-calorie malnutrition: Secondary | ICD-10-CM | POA: Diagnosis not present

## 2016-04-21 DIAGNOSIS — N2581 Secondary hyperparathyroidism of renal origin: Secondary | ICD-10-CM | POA: Diagnosis not present

## 2016-04-21 DIAGNOSIS — N186 End stage renal disease: Secondary | ICD-10-CM | POA: Diagnosis not present

## 2016-04-21 DIAGNOSIS — E1129 Type 2 diabetes mellitus with other diabetic kidney complication: Secondary | ICD-10-CM | POA: Diagnosis not present

## 2016-04-22 DIAGNOSIS — D509 Iron deficiency anemia, unspecified: Secondary | ICD-10-CM | POA: Diagnosis not present

## 2016-04-22 DIAGNOSIS — N186 End stage renal disease: Secondary | ICD-10-CM | POA: Diagnosis not present

## 2016-04-22 DIAGNOSIS — E440 Moderate protein-calorie malnutrition: Secondary | ICD-10-CM | POA: Diagnosis not present

## 2016-04-22 DIAGNOSIS — E1129 Type 2 diabetes mellitus with other diabetic kidney complication: Secondary | ICD-10-CM | POA: Diagnosis not present

## 2016-04-22 DIAGNOSIS — N2581 Secondary hyperparathyroidism of renal origin: Secondary | ICD-10-CM | POA: Diagnosis not present

## 2016-04-22 DIAGNOSIS — N2589 Other disorders resulting from impaired renal tubular function: Secondary | ICD-10-CM | POA: Diagnosis not present

## 2016-04-23 DIAGNOSIS — E440 Moderate protein-calorie malnutrition: Secondary | ICD-10-CM | POA: Diagnosis not present

## 2016-04-23 DIAGNOSIS — N2581 Secondary hyperparathyroidism of renal origin: Secondary | ICD-10-CM | POA: Diagnosis not present

## 2016-04-23 DIAGNOSIS — E1129 Type 2 diabetes mellitus with other diabetic kidney complication: Secondary | ICD-10-CM | POA: Diagnosis not present

## 2016-04-23 DIAGNOSIS — D509 Iron deficiency anemia, unspecified: Secondary | ICD-10-CM | POA: Diagnosis not present

## 2016-04-23 DIAGNOSIS — N2589 Other disorders resulting from impaired renal tubular function: Secondary | ICD-10-CM | POA: Diagnosis not present

## 2016-04-23 DIAGNOSIS — N186 End stage renal disease: Secondary | ICD-10-CM | POA: Diagnosis not present

## 2016-04-24 DIAGNOSIS — N2589 Other disorders resulting from impaired renal tubular function: Secondary | ICD-10-CM | POA: Diagnosis not present

## 2016-04-24 DIAGNOSIS — N186 End stage renal disease: Secondary | ICD-10-CM | POA: Diagnosis not present

## 2016-04-24 DIAGNOSIS — E440 Moderate protein-calorie malnutrition: Secondary | ICD-10-CM | POA: Diagnosis not present

## 2016-04-24 DIAGNOSIS — N2581 Secondary hyperparathyroidism of renal origin: Secondary | ICD-10-CM | POA: Diagnosis not present

## 2016-04-24 DIAGNOSIS — D509 Iron deficiency anemia, unspecified: Secondary | ICD-10-CM | POA: Diagnosis not present

## 2016-04-24 DIAGNOSIS — E1129 Type 2 diabetes mellitus with other diabetic kidney complication: Secondary | ICD-10-CM | POA: Diagnosis not present

## 2016-04-25 DIAGNOSIS — N2581 Secondary hyperparathyroidism of renal origin: Secondary | ICD-10-CM | POA: Diagnosis not present

## 2016-04-25 DIAGNOSIS — Z01818 Encounter for other preprocedural examination: Secondary | ICD-10-CM | POA: Insufficient documentation

## 2016-04-25 DIAGNOSIS — D509 Iron deficiency anemia, unspecified: Secondary | ICD-10-CM | POA: Diagnosis not present

## 2016-04-25 DIAGNOSIS — E1129 Type 2 diabetes mellitus with other diabetic kidney complication: Secondary | ICD-10-CM | POA: Diagnosis not present

## 2016-04-25 DIAGNOSIS — N2589 Other disorders resulting from impaired renal tubular function: Secondary | ICD-10-CM | POA: Diagnosis not present

## 2016-04-25 DIAGNOSIS — N186 End stage renal disease: Secondary | ICD-10-CM | POA: Diagnosis not present

## 2016-04-25 DIAGNOSIS — E440 Moderate protein-calorie malnutrition: Secondary | ICD-10-CM | POA: Diagnosis not present

## 2016-04-26 DIAGNOSIS — F321 Major depressive disorder, single episode, moderate: Secondary | ICD-10-CM | POA: Diagnosis not present

## 2016-04-26 DIAGNOSIS — N2589 Other disorders resulting from impaired renal tubular function: Secondary | ICD-10-CM | POA: Diagnosis not present

## 2016-04-26 DIAGNOSIS — N2581 Secondary hyperparathyroidism of renal origin: Secondary | ICD-10-CM | POA: Diagnosis not present

## 2016-04-26 DIAGNOSIS — E1129 Type 2 diabetes mellitus with other diabetic kidney complication: Secondary | ICD-10-CM | POA: Diagnosis not present

## 2016-04-26 DIAGNOSIS — F411 Generalized anxiety disorder: Secondary | ICD-10-CM | POA: Diagnosis not present

## 2016-04-26 DIAGNOSIS — D509 Iron deficiency anemia, unspecified: Secondary | ICD-10-CM | POA: Diagnosis not present

## 2016-04-26 DIAGNOSIS — E440 Moderate protein-calorie malnutrition: Secondary | ICD-10-CM | POA: Diagnosis not present

## 2016-04-26 DIAGNOSIS — N186 End stage renal disease: Secondary | ICD-10-CM | POA: Diagnosis not present

## 2016-04-27 ENCOUNTER — Inpatient Hospital Stay (HOSPITAL_COMMUNITY): Payer: Medicare Other

## 2016-04-27 ENCOUNTER — Encounter (HOSPITAL_COMMUNITY): Payer: Self-pay

## 2016-04-27 ENCOUNTER — Ambulatory Visit (INDEPENDENT_AMBULATORY_CARE_PROVIDER_SITE_OTHER): Payer: Medicare Other | Admitting: Emergency Medicine

## 2016-04-27 ENCOUNTER — Emergency Department (HOSPITAL_COMMUNITY): Payer: Medicare Other

## 2016-04-27 ENCOUNTER — Inpatient Hospital Stay (HOSPITAL_COMMUNITY)
Admission: EM | Admit: 2016-04-27 | Discharge: 2016-04-28 | DRG: 867 | Disposition: A | Discharge status: Home / Self Care | Payer: Medicare Other | Attending: Family Medicine | Admitting: Family Medicine

## 2016-04-27 VITALS — BP 96/59 | HR 121 | Temp 98.9°F | Resp 18 | Ht 74.0 in | Wt 272.0 lb

## 2016-04-27 DIAGNOSIS — R0682 Tachypnea, not elsewhere classified: Secondary | ICD-10-CM

## 2016-04-27 DIAGNOSIS — K3184 Gastroparesis: Secondary | ICD-10-CM | POA: Diagnosis present

## 2016-04-27 DIAGNOSIS — J449 Chronic obstructive pulmonary disease, unspecified: Secondary | ICD-10-CM | POA: Diagnosis present

## 2016-04-27 DIAGNOSIS — E785 Hyperlipidemia, unspecified: Secondary | ICD-10-CM | POA: Diagnosis present

## 2016-04-27 DIAGNOSIS — Z7982 Long term (current) use of aspirin: Secondary | ICD-10-CM

## 2016-04-27 DIAGNOSIS — H3530 Unspecified macular degeneration: Secondary | ICD-10-CM | POA: Diagnosis present

## 2016-04-27 DIAGNOSIS — R000 Tachycardia, unspecified: Secondary | ICD-10-CM | POA: Diagnosis not present

## 2016-04-27 DIAGNOSIS — I120 Hypertensive chronic kidney disease with stage 5 chronic kidney disease or end stage renal disease: Secondary | ICD-10-CM | POA: Diagnosis present

## 2016-04-27 DIAGNOSIS — Y841 Kidney dialysis as the cause of abnormal reaction of the patient, or of later complication, without mention of misadventure at the time of the procedure: Secondary | ICD-10-CM | POA: Diagnosis present

## 2016-04-27 DIAGNOSIS — K2970 Gastritis, unspecified, without bleeding: Secondary | ICD-10-CM | POA: Diagnosis not present

## 2016-04-27 DIAGNOSIS — K219 Gastro-esophageal reflux disease without esophagitis: Secondary | ICD-10-CM | POA: Diagnosis present

## 2016-04-27 DIAGNOSIS — I2510 Atherosclerotic heart disease of native coronary artery without angina pectoris: Secondary | ICD-10-CM | POA: Diagnosis present

## 2016-04-27 DIAGNOSIS — Z992 Dependence on renal dialysis: Secondary | ICD-10-CM | POA: Diagnosis not present

## 2016-04-27 DIAGNOSIS — Q631 Lobulated, fused and horseshoe kidney: Secondary | ICD-10-CM

## 2016-04-27 DIAGNOSIS — N2589 Other disorders resulting from impaired renal tubular function: Secondary | ICD-10-CM | POA: Diagnosis not present

## 2016-04-27 DIAGNOSIS — E1121 Type 2 diabetes mellitus with diabetic nephropathy: Secondary | ICD-10-CM | POA: Diagnosis present

## 2016-04-27 DIAGNOSIS — E1151 Type 2 diabetes mellitus with diabetic peripheral angiopathy without gangrene: Secondary | ICD-10-CM | POA: Diagnosis not present

## 2016-04-27 DIAGNOSIS — I714 Abdominal aortic aneurysm, without rupture: Secondary | ICD-10-CM | POA: Diagnosis not present

## 2016-04-27 DIAGNOSIS — I959 Hypotension, unspecified: Secondary | ICD-10-CM | POA: Insufficient documentation

## 2016-04-27 DIAGNOSIS — Z794 Long term (current) use of insulin: Secondary | ICD-10-CM | POA: Diagnosis not present

## 2016-04-27 DIAGNOSIS — D631 Anemia in chronic kidney disease: Secondary | ICD-10-CM | POA: Diagnosis present

## 2016-04-27 DIAGNOSIS — E1165 Type 2 diabetes mellitus with hyperglycemia: Secondary | ICD-10-CM | POA: Diagnosis present

## 2016-04-27 DIAGNOSIS — E1143 Type 2 diabetes mellitus with diabetic autonomic (poly)neuropathy: Secondary | ICD-10-CM | POA: Diagnosis present

## 2016-04-27 DIAGNOSIS — T8571XA Infection and inflammatory reaction due to peritoneal dialysis catheter, initial encounter: Secondary | ICD-10-CM | POA: Diagnosis not present

## 2016-04-27 DIAGNOSIS — I252 Old myocardial infarction: Secondary | ICD-10-CM | POA: Diagnosis not present

## 2016-04-27 DIAGNOSIS — K659 Peritonitis, unspecified: Secondary | ICD-10-CM | POA: Diagnosis present

## 2016-04-27 DIAGNOSIS — R109 Unspecified abdominal pain: Secondary | ICD-10-CM | POA: Diagnosis not present

## 2016-04-27 DIAGNOSIS — E1129 Type 2 diabetes mellitus with other diabetic kidney complication: Secondary | ICD-10-CM | POA: Diagnosis not present

## 2016-04-27 DIAGNOSIS — E1122 Type 2 diabetes mellitus with diabetic chronic kidney disease: Secondary | ICD-10-CM | POA: Diagnosis present

## 2016-04-27 DIAGNOSIS — E039 Hypothyroidism, unspecified: Secondary | ICD-10-CM | POA: Diagnosis present

## 2016-04-27 DIAGNOSIS — R10812 Left upper quadrant abdominal tenderness: Secondary | ICD-10-CM | POA: Diagnosis not present

## 2016-04-27 DIAGNOSIS — Z79899 Other long term (current) drug therapy: Secondary | ICD-10-CM

## 2016-04-27 DIAGNOSIS — Z7902 Long term (current) use of antithrombotics/antiplatelets: Secondary | ICD-10-CM

## 2016-04-27 DIAGNOSIS — R1084 Generalized abdominal pain: Secondary | ICD-10-CM | POA: Diagnosis not present

## 2016-04-27 DIAGNOSIS — IMO0002 Reserved for concepts with insufficient information to code with codable children: Secondary | ICD-10-CM | POA: Diagnosis present

## 2016-04-27 DIAGNOSIS — R1010 Upper abdominal pain, unspecified: Secondary | ICD-10-CM | POA: Diagnosis not present

## 2016-04-27 DIAGNOSIS — N186 End stage renal disease: Secondary | ICD-10-CM | POA: Diagnosis present

## 2016-04-27 DIAGNOSIS — R739 Hyperglycemia, unspecified: Secondary | ICD-10-CM | POA: Diagnosis not present

## 2016-04-27 DIAGNOSIS — R0602 Shortness of breath: Secondary | ICD-10-CM | POA: Diagnosis not present

## 2016-04-27 DIAGNOSIS — T8029XA Infection following other infusion, transfusion and therapeutic injection, initial encounter: Secondary | ICD-10-CM | POA: Diagnosis present

## 2016-04-27 DIAGNOSIS — N185 Chronic kidney disease, stage 5: Secondary | ICD-10-CM

## 2016-04-27 DIAGNOSIS — Z6835 Body mass index (BMI) 35.0-35.9, adult: Secondary | ICD-10-CM

## 2016-04-27 DIAGNOSIS — I10 Essential (primary) hypertension: Secondary | ICD-10-CM | POA: Diagnosis present

## 2016-04-27 DIAGNOSIS — N2581 Secondary hyperparathyroidism of renal origin: Secondary | ICD-10-CM | POA: Diagnosis present

## 2016-04-27 DIAGNOSIS — Z951 Presence of aortocoronary bypass graft: Secondary | ICD-10-CM

## 2016-04-27 DIAGNOSIS — E440 Moderate protein-calorie malnutrition: Secondary | ICD-10-CM | POA: Diagnosis not present

## 2016-04-27 DIAGNOSIS — Z87891 Personal history of nicotine dependence: Secondary | ICD-10-CM

## 2016-04-27 DIAGNOSIS — D509 Iron deficiency anemia, unspecified: Secondary | ICD-10-CM | POA: Diagnosis not present

## 2016-04-27 LAB — CBC WITH DIFFERENTIAL/PLATELET
Basophils Absolute: 0 10*3/uL (ref 0.0–0.1)
Basophils Relative: 1 %
EOS ABS: 0.4 10*3/uL (ref 0.0–0.7)
Eosinophils Relative: 6 %
HEMATOCRIT: 27.8 % — AB (ref 39.0–52.0)
HEMOGLOBIN: 9.3 g/dL — AB (ref 13.0–17.0)
Lymphocytes Relative: 16 %
Lymphs Abs: 1.3 10*3/uL (ref 0.7–4.0)
MCH: 27.7 pg (ref 26.0–34.0)
MCHC: 33.5 g/dL (ref 30.0–36.0)
MCV: 82.7 fL (ref 78.0–100.0)
MONOS PCT: 6 %
Monocytes Absolute: 0.5 10*3/uL (ref 0.1–1.0)
NEUTROS ABS: 5.7 10*3/uL (ref 1.7–7.7)
NEUTROS PCT: 71 %
Platelets: 329 10*3/uL (ref 150–400)
RBC: 3.36 MIL/uL — ABNORMAL LOW (ref 4.22–5.81)
RDW: 14.5 % (ref 11.5–15.5)
WBC: 8 10*3/uL (ref 4.0–10.5)

## 2016-04-27 LAB — GLUCOSE, CAPILLARY: GLUCOSE-CAPILLARY: 107 mg/dL — AB (ref 65–99)

## 2016-04-27 LAB — POCT CBC
GRANULOCYTE PERCENT: 78.6 % (ref 37–80)
HEMATOCRIT: 28.9 % — AB (ref 43.5–53.7)
Hemoglobin: 9.9 g/dL — AB (ref 14.1–18.1)
Lymph, poc: 1.3 (ref 0.6–3.4)
MCH, POC: 28.6 pg (ref 27–31.2)
MCHC: 34.4 g/dL (ref 31.8–35.4)
MCV: 83.2 fL (ref 80–97)
MID (cbc): 0.5 (ref 0–0.9)
MPV: 7.1 fL (ref 0–99.8)
PLATELET COUNT, POC: 356 10*3/uL (ref 142–424)
POC GRANULOCYTE: 6.7 (ref 2–6.9)
POC LYMPH %: 15 % (ref 10–50)
POC MID %: 6.4 %M (ref 0–12)
RBC: 3.47 M/uL — AB (ref 4.69–6.13)
RDW, POC: 15.2 %
WBC: 8.5 10*3/uL (ref 4.6–10.2)

## 2016-04-27 LAB — APTT: APTT: 31 s (ref 24–36)

## 2016-04-27 LAB — GLUCOSE, POCT (MANUAL RESULT ENTRY): POC Glucose: 127 mg/dl — AB (ref 70–99)

## 2016-04-27 LAB — COMPREHENSIVE METABOLIC PANEL
ALBUMIN: 2.7 g/dL — AB (ref 3.5–5.0)
ALT: 16 U/L — ABNORMAL LOW (ref 17–63)
AST: 19 U/L (ref 15–41)
Alkaline Phosphatase: 101 U/L (ref 38–126)
Anion gap: 16 — ABNORMAL HIGH (ref 5–15)
BUN: 75 mg/dL — AB (ref 6–20)
CHLORIDE: 100 mmol/L — AB (ref 101–111)
CO2: 23 mmol/L (ref 22–32)
CREATININE: 9.9 mg/dL — AB (ref 0.61–1.24)
Calcium: 7.7 mg/dL — ABNORMAL LOW (ref 8.9–10.3)
GFR calc Af Amer: 6 mL/min — ABNORMAL LOW (ref >60)
GFR, EST NON AFRICAN AMERICAN: 6 mL/min — AB (ref >60)
GLUCOSE: 102 mg/dL — AB (ref 65–99)
Potassium: 3.7 mmol/L (ref 3.5–5.1)
Sodium: 139 mmol/L (ref 135–145)
Total Bilirubin: 0.3 mg/dL (ref 0.3–1.2)
Total Protein: 6.1 g/dL — ABNORMAL LOW (ref 6.5–8.1)

## 2016-04-27 LAB — PHOSPHORUS: Phosphorus: 7.9 mg/dL — ABNORMAL HIGH (ref 2.5–4.6)

## 2016-04-27 LAB — LIPASE, BLOOD: LIPASE: 37 U/L (ref 11–51)

## 2016-04-27 LAB — PROTIME-INR
INR: 1.12
PROTHROMBIN TIME: 14.5 s (ref 11.4–15.2)

## 2016-04-27 LAB — CBG MONITORING, ED: Glucose-Capillary: 95 mg/dL (ref 65–99)

## 2016-04-27 MED ORDER — INSULIN GLARGINE 100 UNIT/ML ~~LOC~~ SOLN: 30.0000 [IU] | Freq: Two times a day (BID) | SUBCUTANEOUS | Status: DC

## 2016-04-27 MED ORDER — PROMETHAZINE HCL 25 MG PO TABS: 25.0000 mg | ORAL_TABLET | Freq: Four times a day (QID) | ORAL | Status: DC | PRN

## 2016-04-27 MED ORDER — METOPROLOL TARTRATE 25 MG PO TABS: 50.0000 mg | ORAL_TABLET | Freq: Two times a day (BID) | ORAL | Status: DC

## 2016-04-27 MED ORDER — CALCIUM ACETATE (PHOS BINDER) 667 MG PO CAPS
1334.0000 mg | ORAL_CAPSULE | Freq: Three times a day (TID) | ORAL | Status: DC
  Administered 2016-04-28: 1334 mg via ORAL
  Filled 2016-04-27: qty 2

## 2016-04-27 MED ORDER — PROMETHAZINE HCL 25 MG/ML IJ SOLN: 12.5000 mg | Freq: Four times a day (QID) | INTRAMUSCULAR | Status: DC | PRN

## 2016-04-27 MED ORDER — HYDROMORPHONE HCL 1 MG/ML IJ SOLN
0.5000 mg | Freq: Once | INTRAMUSCULAR | Status: AC
  Administered 2016-04-27: 0.5 mg via INTRAVENOUS
  Filled 2016-04-27: qty 1

## 2016-04-27 MED ORDER — RENA-VITE PO TABS
1.0000 | ORAL_TABLET | Freq: Every day | ORAL | Status: DC
  Administered 2016-04-27: 1 via ORAL
  Filled 2016-04-27 (×2): qty 1

## 2016-04-27 MED ORDER — ATORVASTATIN CALCIUM 80 MG PO TABS: 80.0000 mg | ORAL_TABLET | Freq: Every day | ORAL | Status: DC

## 2016-04-27 MED ORDER — SODIUM CHLORIDE 0.9 % IV BOLUS (SEPSIS)
500.0000 mL | Freq: Once | INTRAVENOUS | Status: AC
  Administered 2016-04-27: 500 mL via INTRAVENOUS

## 2016-04-27 MED ORDER — ONDANSETRON HCL 4 MG/2ML IJ SOLN
4.0000 mg | Freq: Once | INTRAMUSCULAR | Status: AC | PRN
  Administered 2016-04-27: 4 mg via INTRAVENOUS
  Filled 2016-04-27: qty 2

## 2016-04-27 MED ORDER — INSULIN ASPART 100 UNIT/ML ~~LOC~~ SOLN: 0.0000 [IU] | Freq: Every day | SUBCUTANEOUS | Status: DC

## 2016-04-27 MED ORDER — AMLODIPINE BESYLATE 10 MG PO TABS: 10.0000 mg | ORAL_TABLET | Freq: Every day | ORAL | Status: DC

## 2016-04-27 MED ORDER — CALCITRIOL 0.25 MCG PO CAPS: 0.2500 ug | ORAL_CAPSULE | ORAL | Status: DC

## 2016-04-27 MED ORDER — CYCLOBENZAPRINE HCL 10 MG PO TABS
10.0000 mg | ORAL_TABLET | Freq: Every day | ORAL | Status: DC
  Administered 2016-04-27: 10 mg via ORAL
  Filled 2016-04-27: qty 1

## 2016-04-27 MED ORDER — VANCOMYCIN HCL 10 G IV SOLR
1500.0000 mg | Freq: Once | INTRAVENOUS | Status: AC
  Administered 2016-04-27: 1500 mg via INTRAVENOUS
  Filled 2016-04-27: qty 1500

## 2016-04-27 MED ORDER — CLONAZEPAM 1 MG PO TABS
1.0000 mg | ORAL_TABLET | Freq: Every day | ORAL | Status: DC
  Administered 2016-04-27: 1 mg via ORAL
  Filled 2016-04-27: qty 1

## 2016-04-27 MED ORDER — CEFTAZIDIME 1 G IJ SOLR
1.0000 g | Freq: Every day | INTRAMUSCULAR | Status: DC
  Administered 2016-04-27: 1 g via INTRAVENOUS
  Filled 2016-04-27: qty 1

## 2016-04-27 MED ORDER — HYDROXYZINE HCL 25 MG PO TABS
25.0000 mg | ORAL_TABLET | Freq: Two times a day (BID) | ORAL | Status: DC | PRN
  Administered 2016-04-27: 25 mg via ORAL
  Filled 2016-04-27: qty 1

## 2016-04-27 MED ORDER — LEVOTHYROXINE SODIUM 50 MCG PO TABS
50.0000 ug | ORAL_TABLET | Freq: Every day | ORAL | Status: DC
  Administered 2016-04-28: 50 ug via ORAL
  Filled 2016-04-27: qty 1

## 2016-04-27 MED ORDER — METOPROLOL TARTRATE 50 MG PO TABS
50.0000 mg | ORAL_TABLET | Freq: Two times a day (BID) | ORAL | Status: DC
  Administered 2016-04-27 – 2016-04-28 (×2): 50 mg via ORAL
  Filled 2016-04-27 (×2): qty 1

## 2016-04-27 MED ORDER — GABAPENTIN 400 MG PO CAPS: 800.0000 mg | ORAL_CAPSULE | Freq: Two times a day (BID) | ORAL | Status: DC

## 2016-04-27 MED ORDER — AMLODIPINE BESYLATE 10 MG PO TABS
10.0000 mg | ORAL_TABLET | Freq: Every day | ORAL | Status: DC
  Administered 2016-04-28: 10 mg via ORAL
  Filled 2016-04-27: qty 1

## 2016-04-27 MED ORDER — LISINOPRIL 20 MG PO TABS
20.0000 mg | ORAL_TABLET | Freq: Every day | ORAL | Status: DC
  Administered 2016-04-28: 20 mg via ORAL
  Filled 2016-04-27: qty 1

## 2016-04-27 MED ORDER — METOCLOPRAMIDE HCL 5 MG PO TABS
5.0000 mg | ORAL_TABLET | Freq: Two times a day (BID) | ORAL | Status: DC
  Administered 2016-04-28: 5 mg via ORAL
  Filled 2016-04-27: qty 1

## 2016-04-27 MED ORDER — PROMETHAZINE HCL 25 MG RE SUPP
25.0000 mg | Freq: Four times a day (QID) | RECTAL | Status: DC | PRN
  Filled 2016-04-27: qty 1

## 2016-04-27 MED ORDER — INSULIN ASPART 100 UNIT/ML ~~LOC~~ SOLN: 0.0000 [IU] | Freq: Three times a day (TID) | SUBCUTANEOUS | Status: DC

## 2016-04-27 MED ORDER — HYDROMORPHONE HCL 1 MG/ML IJ SOLN
1.0000 mg | INTRAMUSCULAR | Status: DC | PRN
  Administered 2016-04-27 – 2016-04-28 (×3): 1 mg via INTRAVENOUS
  Filled 2016-04-27 (×3): qty 1

## 2016-04-27 MED ORDER — CALCIUM ACETATE (PHOS BINDER) 667 MG PO CAPS
1334.0000 mg | ORAL_CAPSULE | Freq: Three times a day (TID) | ORAL | Status: DC
  Filled 2016-04-27: qty 2

## 2016-04-27 MED ORDER — ENOXAPARIN SODIUM 30 MG/0.3ML ~~LOC~~ SOLN
30.0000 mg | Freq: Every day | SUBCUTANEOUS | Status: DC
  Filled 2016-04-27: qty 0.3

## 2016-04-27 MED ORDER — CLOPIDOGREL BISULFATE 75 MG PO TABS
75.0000 mg | ORAL_TABLET | Freq: Every day | ORAL | Status: DC
  Administered 2016-04-28: 75 mg via ORAL
  Filled 2016-04-27: qty 1

## 2016-04-27 MED ORDER — ASPIRIN EC 81 MG PO TBEC
81.0000 mg | DELAYED_RELEASE_TABLET | Freq: Every day | ORAL | Status: DC
  Administered 2016-04-28: 81 mg via ORAL
  Filled 2016-04-27: qty 1

## 2016-04-27 NOTE — Consult Note (Addendum)
Russell [name] is an 46 y.o. male referred by Dr Maryfrances Bunnell   Chief Complaint: Abdominal pain x 2d HPI: 46yo WM with ESRD on HD Co abdominal pain x 2d.  Had vomiting yesterday but none today.  No FCS.  Has only been on PD x 2 months and had similar episode last month with negative cultures but abd pain improved with antibiotics. Does cycler 5 exchanges only at night and dry during the day.  He has not noticed any cloudiness to his fluid. Appetite decreased but had been OK prior to this.  He does 5x3L exchange 4.25% x13hr at night only, he is dry during the day.  Past Medical History:  Diagnosis Date  . Age-related macular degeneration, wet, both eyes (HCC)    "I'm getting Alia treatments" (12/30/2014)  . Anxiety   . Asthma   . 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 Daily headache   . Depression   . DVT (deep venous thrombosis), H/o 01/2014-on Xarelto 03/24/2014   LLE  . Hypertension   . Hypothyroidism   . Nephrotic syndrome 05/18/2014  . NSTEMI (non-ST elevated myocardial infarction) (HCC)   . Pneumonia 11/2013  . Renal insufficiency   . Secondary DM with DKA-AG=16, BIcarb Nl 03/24/2014  . Type 2 diabetes mellitus with diabetic nephropathy [facility])     Past Surgical History:  Procedure Laterality Date  . ANKLE FRACTURE SURGERY Right 1988  . AV FISTULA PLACEMENT Left 01/01/2015   Procedure: CREATION OF LEFT RADIAL CEPHALIC ARTERIOVENOUS (AV) FISTULA ;  Surgeon: Pryor Ochoa, MD;  Location: [facility] OR;  Service: Vascular;  Laterality: Left;  . CARDIAC CATHETERIZATION N/A 09/22/2015   Procedure: Left Heart Cath and Coronary Angiography;  Surgeon: Iran Ouch, MD;  Location: [facility] INVASIVE CV LAB;  Service: Cardiovascular;  Laterality: N/A;  . CORONARY ARTERY BYPASS GRAFT N/A 09/28/2015   Procedure:  CORONARY ARTERY BYPASS GRAFTING (CABG) x 3 UTILIZING LEFT MAMMARY ARTERY AND ENDOSCOPICALLY HARVESTED LEFT GREATER SAPHENOUS VEIN.;  Surgeon: Delight Ovens, MD;  Location: [facility] OR;  Service: Open Heart Surgery;  Laterality: N/A;  . ENDOVEIN HARVEST OF GREATER SAPHENOUS VEIN Left 09/28/2015   Procedure: ENDOVEIN HARVEST OF GREATER SAPHENOUS VEIN;  Surgeon: Delight Ovens, MD;  Location: [facility] OR;  Service: Open Heart Surgery;  Laterality: Left;  . ESOPHAGOGASTRODUODENOSCOPY N/A 03/29/2014   Procedure: ESOPHAGOGASTRODUODENOSCOPY (EGD);  Surgeon: Dorena Cookey, MD;  Location: [facility];  Service: Endoscopy;  Laterality: N/A;  . EYE SURGERY    . FRACTURE SURGERY    . INSERTION OF DIALYSIS CATHETER Right 01/05/2015   Procedure: INSERTION OF RIGHT INTERNAL JUGULAR DIALYSIS CATHETER;  Surgeon: Fransisco Hertz, MD;  Location: [facility] OR;  Service: Vascular;  Laterality: Right;  . TEE WITHOUT CARDIOVERSION N/A 09/28/2015   Procedure: TRANSESOPHAGEAL ECHOCARDIOGRAM (TEE);  Surgeon: Delight Ovens, MD;  Location: [facility] OR;  Service: Open Heart Surgery;  Laterality: N/A;  . TONSILLECTOMY AND ADENOIDECTOMY  1970s    Family History  Problem Relation Age of Onset  . Obesity Mother     Patient states that family members have no other medical illnesses other than what I have described  . Kidney cancer Maternal Grandmother   . Cancer Father 50    AML  . Heart disease  No family history  No FH of ESRD Social History:  reports that he quit smoking about 5 weeks ago. His smoking use included Cigarettes. He has never used smokeless tobacco. He reports that he drinks alcohol. He reports that he does not use drugs. Engaged and lives with his fiancee with her 2 children  Allergies:  Allergies  Allergen Reactions  . Ibuprofen Other (See Comments)    MD told patient not to take due to kidney disease  . Nsaids Other (See Comments)    Told to avoid all nsaids due to kidney disease   . Tape Other (See Comments)     Welts result, if left for a long amount of time     (Not in a hospital admission)   Lab Results: UA: ND  Recent Labs  04/27/16 1601 04/27/16 1745  WBC 8.5 8.0  HGB 9.9* 9.3*  HCT 28.9* 27.8*  PLT  --  329   BMET  Recent Labs  04/27/16 1744  NA 139  K 3.7  CL 100*  CO2 23  GLUCOSE 102*  BUN 75*  CREATININE 9.90*  CALCIUM 7.7*   LFT  Recent Labs  04/27/16 1744  PROT 6.1*  ALBUMIN 2.7*  AST 19  ALT 16*  ALKPHOS 101  BILITOT 0.3   Ct Abdomen Pelvis Wo Contrast  Result Date: 04/27/2016 CLINICAL DATA:  46 year old male with abdominal and pelvic pain with nausea for 3 days. EXAM: CT ABDOMEN AND PELVIS WITHOUT CONTRAST TECHNIQUE: Multidetector CT imaging of the abdomen and pelvis was performed following the standard protocol without IV contrast. COMPARISON:  None. FINDINGS: Please note that parenchymal abnormalities may be missed without intravenous contrast. Lower chest: No acute abnormality Hepatobiliary: The liver and gallbladder are unremarkable. There is no evidence of biliary dilatation. 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 There is no evidence of hydronephrosis or urinary calculi. The adrenal glands and bladder are unremarkable. Stomach/Bowel: A moderate amount of pneumoperitoneum throughout the mid and upper abdomen identified. A peritoneal dialysis catheter is noted. No bowel abnormalities are identified. No bowel wall thickening or bowel obstruction noted. There is no evidence of inflammatory changes around any bowel loops. Vascular/Lymphatic: Aortic atherosclerotic calcification noted without aneurysm. No enlarged lymph nodes identified. Reproductive: Prostate is unremarkable. Other: No ascites or abscess. Musculoskeletal: No acute or significant osseous findings. IMPRESSION: Moderate amount of pneumoperitoneum within the mid and upper abdomen. No bowel abnormality or inflammation is identified and  pneumoperitoneum is most likely related to air from the patient's peritoneal dialysis. Correlate clinically and consider follow-up as bowel perforation it is difficult to entirely exclude. Abdominal aortic atherosclerosis. Horseshoe kidney. Electronically Signed   By: Harmon Pier M.D.   On: 04/27/2016 18:24    ROS: Appetite poor Mild SOB but says due to unable to take deep breaths due to abd pain No CP No diarrhea Neuropathic pain in hands and feet Chronic pain of shoulders and neck No dysuria  PHYSICAL EXAM: Blood pressure 135/77, pulse (!) 112, temperature 99.2 F (37.3 C), temperature source Oral, resp. rate 15, SpO2 95 %. HEENT: PERRLA EOMI NECK:No JVD LUNGS:Clear CARDIAC:RRR wo MRG ABD:+ BS, + tenderness mostly LUQ, + guarding, No rebound.  PD cath LUQ, mild erythema no pus EXT:0-tr edema  Lt lower arm AVF + bruit NEURO:CNI Ox3, no asterixis  Assessment: 1. Abdominal pain in PD pt.  Peritonitis until proven otherwise 2. HTN 3. DM 4. CAD, stable 5. ESRD 6. Sec HPTH on calcitriol PLAN:  1. Will do I/O exchange for cell count and C&S 2. Empiric AB  vanco and fortaz 3. Hold off on PD tonight 4. Resume BP meds.  BP meds and dialysis meds ordered 5. Get info from home training in AM 6. CXR  Had infiltrate in LLL last month   Chevy Virgo T 04/27/2016, 10:14 PM

## 2016-04-27 NOTE — H&P (Addendum)
History and Physical  Patient Name: Russell [name]     ZOX:[id number]    DOB: April 13, 1970    DOA: 04/27/2016 PCP: Shade Flood, MD   Patient coming from: Home --> PCP's office  Chief Complaint: Abdominal pain  HPI: Russell [name] is a 46 y.o. male with a past medical history significant for ESRD on PD, IDDM, HTN, CAD s/p CABG and hypothyroidism who presents with abdominal pain for 2 days.  The patient was in his usual state of health until about 2 days ago when he started to develop left-sided abdominal pain as well as nausea, vomiting, diarrhea, and decreased appetite. He tried taking his Reglan which helped a little bit and some Dilaudid which helped at all, and his pain slowly progressed. He called "my nurse" about blood sugars in the 500s yesterday and today, and she recommended he see his PCP, which he did today.  There he was listless and tachycardic and sent to the ER.  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 About 6 weeks ago he started peritoneal dialysis. One month ago he was admitted with peritonitis, treated with Vanco and ceftaz, cultures never grew anything.  He reports this completely resolved afterwards.        ROS: Review of Systems  Constitutional: Positive for chills and malaise/fatigue. Negative for fever.  Gastrointestinal: Positive for abdominal pain, diarrhea, nausea and vomiting. Negative for blood in stool and melena.  All  other systems reviewed and are negative.         Past Medical History:  Diagnosis Date  . Age-related macular degeneration, wet, both eyes (HCC)    "I'm getting Alia treatments" (12/30/2014)  . Anxiety   . Asthma   . 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 Daily headache   . Depression   . DVT (deep venous thrombosis), H/o 01/2014-on Xarelto 03/24/2014   LLE  . Hypertension   . Hypothyroidism   . Nephrotic syndrome 05/18/2014  . NSTEMI (non-ST elevated myocardial infarction) (HCC)   . Pneumonia 11/2013  . Renal insufficiency   . Secondary DM with DKA-AG=16, BIcarb Nl 03/24/2014  . Type 2 diabetes mellitus with diabetic nephropathy [facility])     Past Surgical History:  Procedure Laterality Date  . ANKLE FRACTURE SURGERY Right 1988  . AV FISTULA PLACEMENT Left 01/01/2015   Procedure: CREATION OF LEFT RADIAL CEPHALIC ARTERIOVENOUS (AV) FISTULA ;  Surgeon: Pryor Ochoa, MD;  Location: [facility] OR;  Service: Vascular;  Laterality: Left;  . CARDIAC CATHETERIZATION N/A 09/22/2015   Procedure: Left Heart Cath and Coronary Angiography;  Surgeon: Iran Ouch, MD;  Location: [facility] INVASIVE CV LAB;  Service: Cardiovascular;  Laterality: N/A;  . CORONARY ARTERY BYPASS GRAFT N/A 09/28/2015   Procedure: CORONARY ARTERY BYPASS  GRAFTING (CABG) x 3 UTILIZING LEFT MAMMARY ARTERY AND ENDOSCOPICALLY HARVESTED LEFT GREATER SAPHENOUS VEIN.;  Surgeon: Delight Ovens, MD;  Location: [facility] OR;  Service: Open Heart Surgery;  Laterality: N/A;  . ENDOVEIN HARVEST OF GREATER SAPHENOUS VEIN Left 09/28/2015   Procedure: ENDOVEIN HARVEST OF GREATER SAPHENOUS VEIN;  Surgeon: Delight Ovens, MD;  Location: [facility] OR;  Service: Open Heart Surgery;  Laterality: Left;  . ESOPHAGOGASTRODUODENOSCOPY N/A 03/29/2014   Procedure:  ESOPHAGOGASTRODUODENOSCOPY (EGD);  Surgeon: Dorena Cookey, MD;  Location: [facility];  Service: Endoscopy;  Laterality: N/A;  . EYE SURGERY    . FRACTURE SURGERY    . INSERTION OF DIALYSIS CATHETER Right 01/05/2015   Procedure: INSERTION OF RIGHT INTERNAL JUGULAR DIALYSIS CATHETER;  Surgeon: Fransisco Hertz, MD;  Location: [facility] OR;  Service: Vascular;  Laterality: Right;  . TEE WITHOUT CARDIOVERSION N/A 09/28/2015   Procedure: TRANSESOPHAGEAL ECHOCARDIOGRAM (TEE);  Surgeon: Delight Ovens, MD;  Location: [facility] OR;  Service: Open Heart Surgery;  Laterality: N/A;  . TONSILLECTOMY AND ADENOIDECTOMY  1970s    Social History: Patient lives with his fiancee.  The patient walks unassisted.  He is from [place].  Does not smoke anymore.    Allergies  Allergen Reactions  . Ibuprofen Other (See Comments)    MD told patient not to take due to kidney disease  . Nsaids Other (See Comments)    Told to avoid all nsaids due to kidney disease   . Tape Other (See Comments)    Welts result, if left for a long amount of time    Family history: family history includes Cancer (age of onset: 43) in his father; Kidney cancer in his maternal grandmother; Obesity in his mother.  Prior to Admission medications   Medication Sig Start Date End Date Taking? Authorizing Provider  acetaminophen (TYLENOL) 500 MG tablet Take 1 tablet (500 mg total) by mouth every 6 (six) hours as needed for moderate pain. Patient taking differently: Take 1,000 mg by mouth every 6 (six) hours as needed (for pain).  09/29/14  Yes Albertine Grates, MD  albuterol (PROVENTIL HFA;VENTOLIN HFA) 108 (90 Base) MCG/ACT inhaler Inhale 1 puff into the lungs every 6 (six) hours as needed for wheezing or shortness of breath. 03/11/15  Yes [name], MD  albuterol (PROVENTIL) (2.5 MG/3ML) 0.083% nebulizer solution Take 2.5 mg by nebulization every 6 (six) hours as needed for wheezing.   Yes Historical Provider, MD  albuterol-ipratropium (COMBIVENT) 18-103 MCG/ACT  inhaler Inhale 2 puffs into the lungs every 4 (four) hours. Patient taking differently: Inhale 2 puffs into the lungs every 6 (six) hours as needed for wheezing or shortness of breath.  09/03/14  Yes [name], MD  amLODipine (NORVASC) 10 MG tablet Take 1 tablet (10 mg total) by mouth daily. 04/17/16  Yes Shade Flood, MD  aspirin EC 81 MG EC tablet Take 1 tablet (81 mg total) by mouth daily. 10/06/15  Yes Erin R Barrett, PA-C  atorvastatin (LIPITOR) 80 MG tablet Take 1 tablet (80 mg total) by mouth daily at 6 PM. 04/17/16  Yes Shade Flood, MD  calcitRIOL (ROCALTROL) 0.25 MCG capsule Take 1 capsule (0.25 mcg total) by mouth every Monday, Wednesday, and Friday with hemodialysis. 10/06/15  Yes Erin R Barrett, PA-C  calcium acetate (PHOSLO) 667 MG capsule Take 2 capsules (1,334 mg total) by mouth 3 (three) times daily with meals. 01/08/15  Yes Rolly Salter, MD  cefTAZidime 2 g in dextrose 5 % 50 mL Inject  2 g into the vein every Monday, Wednesday, and Friday. WITH HD SESSIONS x 2 weeks 03/10/16   Bethany Molt, DO  clonazePAM (KLONOPIN) 2 MG tablet Take 1 mg by mouth at bedtime.     Historical Provider, MD  clopidogrel (PLAVIX) 75 MG tablet Take 1 tablet (75 mg total) by mouth daily. 01/27/16   Lewayne Bunting, MD  cyclobenzaprine (FLEXERIL) 10 MG tablet Take 1 tablet (10 mg total) by mouth at bedtime. 05/22/15   [name], MD  gabapentin (NEURONTIN) 100 MG capsule Take 2 capsules (200 mg total) by mouth 3 (three) times daily. Patient taking differently: Take 800 mg by mouth 2 (two) times daily.  05/22/15   [name], MD  HYDROmorphone (DILAUDID) 2 MG tablet Take 1 tablet (2 mg total) by mouth every 8 (eight) hours as needed for severe pain. Patient taking differently: Take 2-4 mg by mouth every 8 (eight) hours as needed for severe pain.  05/22/15   [name], MD  hydrOXYzine (ATARAX/VISTARIL) 25 MG tablet Take 25 mg by mouth 2 (two) times daily as needed for anxiety.     Historical  Provider, MD  insulin glargine (LANTUS) 100 UNIT/ML injection Inject 0.3 mLs (30 Units total) into the skin 2 (two) times daily. 03/10/16   Bethany Molt, DO  insulin lispro (HUMALOG) 100 UNIT/ML injection Inject 0.15-0.3 mLs (15-30 Units total) into the skin every evening. Take one unit per 5 carbs depending on dinner. Patient taking differently: Inject 15-30 Units into the skin 3 (three) times daily with meals. Take one unit per 5 carbs depending on dinner. 11/29/15   Carlus Pavlov, MD  levothyroxine (SYNTHROID, LEVOTHROID) 50 MCG tablet Take 1 tablet (50 mcg total) by mouth daily. 05/22/15   [name], MD  metoCLOPramide (REGLAN) 5 MG tablet Take 1 tablet (5 mg total) by mouth every 8 (eight) hours as needed for nausea or vomiting. 01/10/16   Dwana Melena, DO  metoprolol tartrate (LOPRESSOR) 50 MG tablet Take 1 tablet (50 mg total) by mouth 2 (two) times daily. 04/17/16   Shade Flood, MD  polyethylene glycol [facility]) packet Take 17 g by mouth daily. 07/20/15   Milagros Loll, MD  promethazine (PHENERGAN) 25 MG tablet Take 1 tablet (25 mg total) by mouth every 6 (six) hours as needed for nausea or vomiting. 01/08/16   Kristen N Ward, DO  vancomycin 2,500 mg in sodium chloride 0.9 % 500 mL Inject 2,500 mg into the vein every Monday, Wednesday, and Friday with hemodialysis. 03/10/16   Bethany Molt, DO       Physical Exam: BP (!) 153/88   Pulse (!) 113   Temp 99.2 F (37.3 C) (Oral)   Resp 20   SpO2 100%  General appearance: Well-developed, adult male, alert and in mild distress from malaise.   Eyes: Anicteric, conjunctiva pink, lids and lashes normal. PERRL.    ENT: No nasal deformity, discharge, epistaxis.  Hearing normal. OP tacky dry without lesions.   Neck: No neck masses.  Trachea midline.  No thyromegaly/tenderness. Lymph: No cervical or supraclavicular lymphadenopathy. Skin: Warm and dry.  No jaundice.  No suspicious rashes or lesions. Cardiac: Tachycardic, regular, nl S1-S2, no  murmurs appreciated.  Capillary refill is brisk.  JVP not visible.  No LE edema.  Radial and DP pulses 2+ and symmetric. Respiratory: Normal respiratory rate and rhythm.  CTAB without rales or wheezes. Abdomen: Abdomen soft.  Moderate TTP diffusely, no guarding or rebound, no rigidity. No ascites, distension, hepatosplenomegaly.  MSK: No deformities or effusions.  No cyanosis or clubbing. Neuro: Cranial nerves normal.  Sensation intact to light touch. Speech is fluent.  Muscle strength normal.    Psych: Sensorium intact and responding to questions, attention normal.  Behavior appropriate.  Affect normal.  Judgment and insight appear normal.     Labs on Admission:  I have personally reviewed following labs and imaging studies: CBC:  Recent Labs Lab 04/27/16 1601 04/27/16 1745  WBC 8.5 8.0  NEUTROABS  --  5.7  HGB 9.9* 9.3*  HCT 28.9* 27.8*  MCV 83.2 82.7  PLT  --  329   Basic Metabolic Panel:  Recent Labs Lab 04/27/16 1744  NA 139  K 3.7  CL 100*  CO2 23  GLUCOSE 102*  BUN 75*  CREATININE 9.90*  CALCIUM 7.7*   GFR: Estimated Creatinine Clearance: 13.2 mL/min (A) (by C-G formula based on SCr of 9.9 mg/dL (H)).  Liver Function Tests:  Recent Labs Lab 04/27/16 1744  AST 19  ALT 16*  ALKPHOS 101  BILITOT 0.3  PROT 6.1*  ALBUMIN 2.7*    Recent Labs Lab 04/27/16 1744  LIPASE 37   No results for input(s): AMMONIA in the last 168 hours. Coagulation Profile:  Recent Labs Lab 04/27/16 1744  INR 1.12   Cardiac Enzymes: No results for input(s): CKTOTAL, CKMB, CKMBINDEX, TROPONINI in the last 168 hours. BNP (last 3 results) No results for input(s): PROBNP in the last 8760 hours. HbA1C: No results for input(s): HGBA1C in the last 72 hours. CBG: No results for input(s): GLUCAP in the last 168 hours. Lipid Profile: No results for input(s): CHOL, HDL, LDLCALC, TRIG, CHOLHDL, LDLDIRECT in the last 72 hours. Thyroid Function Tests: No results for input(s):  TSH, T4TOTAL, FREET4, T3FREE, THYROIDAB in the last 72 hours. Anemia Panel: No results for input(s): VITAMINB12, FOLATE, FERRITIN, TIBC, IRON, RETICCTPCT in the last 72 hours. Sepsis Labs:  Invalid input(s): PROCALCITONIN, LACTICIDVEN No results found for this or any previous visit (from the past 240 hour(s)).       Radiological Exams on Admission: Personally reviewed CT abdomen report: Ct Abdomen Pelvis Wo Contrast  Result Date: 04/27/2016 CLINICAL DATA:  46 year old male with abdominal and pelvic pain with nausea for 3 days. EXAM: CT ABDOMEN AND PELVIS WITHOUT CONTRAST TECHNIQUE: Multidetector CT imaging of the abdomen and pelvis was performed following the standard protocol without IV contrast. COMPARISON:  None. FINDINGS: Please note that parenchymal abnormalities may be missed without intravenous contrast. Lower chest: No acute abnormality Hepatobiliary: The liver and gallbladder are unremarkable. There is no evidence of biliary dilatation. 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 There is no evidence of hydronephrosis or urinary calculi. The adrenal glands and bladder are unremarkable. Stomach/Bowel: A moderate amount of pneumoperitoneum throughout the mid and upper abdomen identified. A peritoneal dialysis catheter is noted. No bowel abnormalities are identified. No bowel wall thickening or bowel obstruction noted. There is no evidence of inflammatory changes around any bowel loops. Vascular/Lymphatic: Aortic atherosclerotic calcification noted without aneurysm. No enlarged lymph nodes identified. Reproductive: Prostate is unremarkable. Other: No ascites or abscess. Musculoskeletal: No acute or significant osseous findings. IMPRESSION: Moderate amount of pneumoperitoneum within the mid and upper abdomen. No bowel abnormality or inflammation is identified and pneumoperitoneum is most likely related to air from the patient's peritoneal  dialysis. Correlate clinically and consider follow-up as bowel perforation it is difficult to entirely exclude. Abdominal aortic atherosclerosis. Horseshoe kidney. Electronically Signed   By:  Harmon Pier M.D.   On: 04/27/2016 18:24    EKG: Personally reviewed, Rate 114.  Normal QT interval.  No ST changes. Nonspecific TW changes.          Assessment/Plan  1. Peritoneal dialysis associated peritonitis:  Second time in 2 months since PD started 2 months ago. Abdomen exam relatively benign, low concern for bowel perforation at this time.   -Obtain cell count and culture from peritoneal washing -Afterwards start vancomycin and Fortaz -Check lactate   2. Insulin-dependent diabetes:  Normal glycemic at admission. -Continue Lantus 30 twice a day -SSI with meals -Diabetes education   3. End-stage renal disease:  -Consult to nephrology -Continue calcitriol and PhosLo  4. Hypertension and coronary disease secondary prevention:  Normotensive on admission. -Continue statin and Plavix and aspirin -Continue metoprolol and amlodipine  5. Hypothyroidism:  -Continue levothyroxine  6. Anemia related CKD:  At baseline  7. Other medications:  -Continue gabapentin, Flexeril, Atarax when necessary -Continue citalopram -Continue clonazepam daily at bedtime     DVT prophylaxis: Lovenox low dose  Code Status: FULL  Family Communication: None presnet  Disposition Plan: Anticipate peritoneal sample then start antibiotics. Consults called: Nephrology Admission status: INPATIETN        Medical decision making: Patient seen at 9:15 PM on 04/27/2016.  The patient was discussed with Dr. Estell Harpin.  What exists of the patient's chart was reviewed in depth and summarized above.  Clinical condition: stable.        Alberteen Sam [facility] Pager [phone]   At the time of admission, it appears that the appropriate admission status for this patient is INPATIENT. This  is judged to be reasonable and necessary in order to provide the required intensity of service to ensure the patient's safety given the presenting symptoms, physical exam findings, and initial radiographic and laboratory data in the context of their chronic comorbidities.  Together, these circumstances are felt to place him at high risk for further clinical deterioration threatening life, limb, or organ.   Patient requires inpatient status due to high intensity of service, high risk for further deterioration and high frequency of surveillance required because of this acute illness that poses a threat to life, limb or bodily function.  I certify that at the point of admission it is my clinical judgment that the patient will require inpatient hospital care spanning beyond 2 midnights from the point of admission and that early discharge would result in unnecessary risk of decompensation and readmission or threat to life, limb or bodily function.

## 2016-04-27 NOTE — ED Notes (Signed)
Nephrologist at the bedside

## 2016-04-27 NOTE — ED Provider Notes (Signed)
[facility]-EMERGENCY DEPT Provider Note   CSN: [id number] Arrival date & time: 04/27/16  1716     History   Chief Complaint Chief Complaint  Patient presents with  . Abdominal Pain    HPI Russell [name] is a 46 y.o. male.  Patient was complaining of nausea and diarrhea and abdominal pain. He gets peritoneal dialysis and he has pain over his insertion site.   The history is provided by the patient.  Abdominal Pain   This is a new problem. The current episode started more than 2 days ago. The problem occurs constantly. The problem has not changed since onset.The pain is associated with an unknown factor. The pain is located in the LLQ. The quality of the pain is aching. The pain is at a severity of 4/10. The pain is mild. Pertinent negatives include anorexia, diarrhea, frequency, hematuria and headaches.    Past Medical History:  Diagnosis Date  . Age-related macular degeneration, wet, both eyes (HCC)    "I'm getting Alia treatments" (12/30/2014)  . Anxiety   . Asthma   . 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 Daily headache   . Depression   . DVT (deep venous thrombosis), H/o 01/2014-on Xarelto 03/24/2014   LLE  . Hypertension   . Hypothyroidism   . Nephrotic syndrome 05/18/2014  . NSTEMI (non-ST elevated myocardial infarction) (HCC)   . Pneumonia 11/2013  . Renal insufficiency   . Secondary DM with DKA-AG=16, BIcarb Nl 03/24/2014  . Type 2 diabetes mellitus with diabetic nephropathy [facility])     Patient Active Problem List   Diagnosis Date Noted  . Hypotension 04/27/2016  . Peritoneal dialysis-associated peritonitis (HCC) 04/27/2016  . Anemia, chronic renal failure, stage 5 (HCC) 04/27/2016  . Peritonitis associated with peritoneal dialysis (HCC) 03/10/2016  . Abdominal pain  03/09/2016  . Hyperkalemia   . S/P CABG (coronary artery bypass graft) 09/28/2015  . Coronary artery disease involving native coronary artery of native heart with unstable angina pectoris (HCC)   . Hyperglycemia due to type 2 diabetes mellitus (HCC) 09/20/2015  . NSTEMI (non-ST elevated myocardial infarction) (HCC)   . Elevated troponin   . Angina pectoris (HCC) 07/20/2015  . Precordial pain 07/20/2015  . Hyperglycemia 07/20/2015  . DKA (diabetic ketoacidoses) (HCC) 02/13/2015  . ESRD on dialysis (HCC) 02/13/2015  . Nausea and vomiting 02/13/2015  . Nephrotic syndrome 01/03/2015  . Hypertensive urgency 12/30/2014  . Essential hypertension, benign   . Thyroid activity decreased   . Hx of gastroesophageal reflux (GERD) 09/30/2014  . Prolonged Q-T interval on ECG 09/30/2014  . Acute on chronic renal failure (HCC) 09/24/2014  . Diabetic neuropathy (HCC) 09/21/2014  . Uncontrolled diabetes mellitus type 2 with peripheral artery disease (HCC) 09/21/2014  . Morbid obesity (HCC) 09/21/2014  . Superficial thrombophlebitis 03/24/2014  . Hypothyroidism (acquired) 03/24/2014    Past Surgical History:  Procedure Laterality Date  . ANKLE FRACTURE SURGERY Right 1988  . AV FISTULA PLACEMENT Left 01/01/2015   Procedure: CREATION OF LEFT RADIAL CEPHALIC ARTERIOVENOUS (AV) FISTULA ;  Surgeon: Pryor Ochoa, MD;  Location: [facility] OR;  Service: Vascular;  Laterality: Left;  . CARDIAC CATHETERIZATION N/A 09/22/2015   Procedure: Left Heart Cath and Coronary Angiography;  Surgeon: Iran Ouch, MD;  Location: [facility] INVASIVE  CV LAB;  Service: Cardiovascular;  Laterality: N/A;  . CORONARY ARTERY BYPASS GRAFT N/A 09/28/2015   Procedure: CORONARY ARTERY BYPASS GRAFTING (CABG) x 3 UTILIZING LEFT MAMMARY ARTERY AND ENDOSCOPICALLY HARVESTED LEFT GREATER SAPHENOUS VEIN.;  Surgeon: Delight Ovens, MD;  Location: [facility] OR;  Service: Open Heart Surgery;  Laterality: N/A;  . ENDOVEIN HARVEST OF GREATER SAPHENOUS VEIN  Left 09/28/2015   Procedure: ENDOVEIN HARVEST OF GREATER SAPHENOUS VEIN;  Surgeon: Delight Ovens, MD;  Location: [facility] OR;  Service: Open Heart Surgery;  Laterality: Left;  . ESOPHAGOGASTRODUODENOSCOPY N/A 03/29/2014   Procedure: ESOPHAGOGASTRODUODENOSCOPY (EGD);  Surgeon: Dorena Cookey, MD;  Location: [facility] ENDOSCOPY;  Service: Endoscopy;  Laterality: N/A;  . EYE SURGERY    . FRACTURE SURGERY    . INSERTION OF DIALYSIS CATHETER Right 01/05/2015   Procedure: INSERTION OF RIGHT INTERNAL JUGULAR DIALYSIS CATHETER;  Surgeon: Fransisco Hertz, MD;  Location: [facility] OR;  Service: Vascular;  Laterality: Right;  . TEE WITHOUT CARDIOVERSION N/A 09/28/2015   Procedure: TRANSESOPHAGEAL ECHOCARDIOGRAM (TEE);  Surgeon: Delight Ovens, MD;  Location: [facility] OR;  Service: Open Heart Surgery;  Laterality: N/A;  . TONSILLECTOMY AND ADENOIDECTOMY  1970s       Home Medications    Prior to Admission medications   Medication Sig Start Date End Date Taking? Authorizing Provider  acetaminophen (TYLENOL) 500 MG tablet Take 1 tablet (500 mg total) by mouth every 6 (six) hours as needed for moderate pain. Patient taking differently: Take 1,000 mg by mouth every 6 (six) hours as needed (for pain).  09/29/14  Yes Albertine Grates, MD  albuterol (PROVENTIL HFA;VENTOLIN HFA) 108 (90 Base) MCG/ACT inhaler Inhale 1 puff into the lungs every 6 (six) hours as needed for wheezing or shortness of breath. 03/11/15  Yes [name], MD  albuterol (PROVENTIL) (2.5 MG/3ML) 0.083% nebulizer solution Take 2.5 mg by nebulization every 6 (six) hours as needed for wheezing.   Yes Historical Provider, MD  albuterol-ipratropium (COMBIVENT) 18-103 MCG/ACT inhaler Inhale 2 puffs into the lungs every 4 (four) hours. Patient taking differently: Inhale 2 puffs into the lungs every 6 (six) hours as needed for wheezing or shortness of breath.  09/03/14  Yes [name], MD  amLODipine (NORVASC) 10 MG tablet Take 1 tablet (10 mg total) by mouth daily. 04/17/16  Yes  Shade Flood, MD  aspirin EC 81 MG EC tablet Take 1 tablet (81 mg total) by mouth daily. 10/06/15  Yes Erin R Barrett, PA-C  atorvastatin (LIPITOR) 80 MG tablet Take 1 tablet (80 mg total) by mouth daily at 6 PM. 04/17/16  Yes Shade Flood, MD  calcitRIOL (ROCALTROL) 0.25 MCG capsule Take 1 capsule (0.25 mcg total) by mouth every Monday, Wednesday, and Friday with hemodialysis. 10/06/15  Yes Erin R Barrett, PA-C  calcium acetate (PHOSLO) 667 MG capsule Take 2 capsules (1,334 mg total) by mouth 3 (three) times daily with meals. 01/08/15  Yes Rolly Salter, MD  cefTAZidime 2 g in dextrose 5 % 50 mL Inject 2 g into the vein every Monday, Wednesday, and Friday. WITH HD SESSIONS x 2 weeks 03/10/16   Bethany Molt, DO  clonazePAM (KLONOPIN) 2 MG tablet Take 1 mg by mouth at bedtime.     Historical Provider, MD  clopidogrel (PLAVIX) 75 MG tablet Take 1 tablet (75 mg total) by mouth daily. 01/27/16   Lewayne Bunting, MD  cyclobenzaprine (FLEXERIL) 10 MG tablet Take 1 tablet (10 mg total) by mouth at bedtime. 05/22/15  [name], MD  gabapentin (NEURONTIN) 100 MG capsule Take 2 capsules (200 mg total) by mouth 3 (three) times daily. Patient taking differently: Take 800 mg by mouth 2 (two) times daily.  05/22/15   [name], MD  HYDROmorphone (DILAUDID) 2 MG tablet Take 1 tablet (2 mg total) by mouth every 8 (eight) hours as needed for severe pain. Patient taking differently: Take 2-4 mg by mouth every 8 (eight) hours as needed for severe pain.  05/22/15   [name], MD  hydrOXYzine (ATARAX/VISTARIL) 25 MG tablet Take 25 mg by mouth 2 (two) times daily as needed for anxiety.     Historical Provider, MD  insulin glargine (LANTUS) 100 UNIT/ML injection Inject 0.3 mLs (30 Units total) into the skin 2 (two) times daily. 03/10/16   Bethany Molt, DO  insulin lispro (HUMALOG) 100 UNIT/ML injection Inject 0.15-0.3 mLs (15-30 Units total) into the skin every evening. Take one unit per 5 carbs depending on  dinner. Patient taking differently: Inject 15-30 Units into the skin 3 (three) times daily with meals. Take one unit per 5 carbs depending on dinner. 11/29/15   Carlus Pavlov, MD  levothyroxine (SYNTHROID, LEVOTHROID) 50 MCG tablet Take 1 tablet (50 mcg total) by mouth daily. 05/22/15   [name], MD  metoCLOPramide (REGLAN) 5 MG tablet Take 1 tablet (5 mg total) by mouth every 8 (eight) hours as needed for nausea or vomiting. 01/10/16   Dwana Melena, DO  metoprolol tartrate (LOPRESSOR) 50 MG tablet Take 1 tablet (50 mg total) by mouth 2 (two) times daily. 04/17/16   Shade Flood, MD  polyethylene glycol [facility]) packet Take 17 g by mouth daily. 07/20/15   Milagros Loll, MD  promethazine (PHENERGAN) 25 MG tablet Take 1 tablet (25 mg total) by mouth every 6 (six) hours as needed for nausea or vomiting. 01/08/16   Kristen N Ward, DO  vancomycin 2,500 mg in sodium chloride 0.9 % 500 mL Inject 2,500 mg into the vein every Monday, Wednesday, and Friday with hemodialysis. 03/10/16   Bethany Molt, DO    Family History Family History  Problem Relation Age of Onset  . Obesity Mother     Patient states that family members have no other medical illnesses other than what I have described  . Kidney cancer Maternal Grandmother   . Cancer Father 50    AML  . Heart disease      No family history    Social History Social History  Substance Use Topics  . Smoking status: Former Smoker    Types: Cigarettes    Quit date: 03/17/2016  . Smokeless tobacco: Never Used  . Alcohol use 0.0 oz/week     Allergies   Ibuprofen; Nsaids; and Tape   Review of Systems Review of Systems  Constitutional: Negative for appetite change and fatigue.  HENT: Negative for congestion, ear discharge and sinus pressure.   Eyes: Negative for discharge.  Respiratory: Negative for cough.   Cardiovascular: Negative for chest pain.  Gastrointestinal: Positive for abdominal pain. Negative for anorexia and diarrhea.    Genitourinary: Negative for frequency and hematuria.  Musculoskeletal: Negative for back pain.  Skin: Negative for rash.  Neurological: Negative for seizures and headaches.  Psychiatric/Behavioral: Negative for hallucinations.     Physical Exam Updated Vital Signs BP (!) 153/88   Pulse (!) 113   Temp 99.2 F (37.3 C) (Oral)   Resp 20   SpO2 100%   Physical Exam  Constitutional: He is oriented to person,  place, and time. He appears well-developed.  HENT:  Head: Normocephalic.  Eyes: Conjunctivae and EOM are normal. No scleral icterus.  Neck: Neck supple. No thyromegaly present.  Cardiovascular: Normal rate and regular rhythm.  Exam reveals no gallop and no friction rub.   No murmur heard. Pulmonary/Chest: No stridor. He has no wheezes. He has no rales. He exhibits no tenderness.  Abdominal: He exhibits no distension. There is tenderness. There is no rebound.  Mild llq tender  Musculoskeletal: Normal range of motion. He exhibits no edema.  Lymphadenopathy:    He has no cervical adenopathy.  Neurological: He is oriented to person, place, and time. He exhibits normal muscle tone. Coordination normal.  Skin: No rash noted. No erythema.  Psychiatric: He has a normal mood and affect. His behavior is normal.     ED Treatments / Results  Labs (all labs ordered are listed, but only abnormal results are displayed) Labs Reviewed  COMPREHENSIVE METABOLIC PANEL - Abnormal; Notable for the following:       Result Value   Chloride 100 (*)    Glucose, Bld 102 (*)    BUN 75 (*)    Creatinine, Ser 9.90 (*)    Calcium 7.7 (*)    Total Protein 6.1 (*)    Albumin 2.7 (*)    ALT 16 (*)    GFR calc non Af Amer 6 (*)    GFR calc Af Amer 6 (*)    Anion gap 16 (*)    All other components within normal limits  CBC WITH DIFFERENTIAL/PLATELET - Abnormal; Notable for the following:    RBC 3.36 (*)    Hemoglobin 9.3 (*)    HCT 27.8 (*)    All other components within normal limits  BODY  FLUID CULTURE  LIPASE, BLOOD  PROTIME-INR  APTT  URINALYSIS, ROUTINE W REFLEX MICROSCOPIC  BODY FLUID CELL COUNT WITH DIFFERENTIAL    EKG  EKG Interpretation None       Radiology Ct Abdomen Pelvis Wo Contrast  Result Date: 04/27/2016 CLINICAL DATA:  46 year old male with abdominal and pelvic pain with nausea for 3 days. EXAM: CT ABDOMEN AND PELVIS WITHOUT CONTRAST TECHNIQUE: Multidetector CT imaging of the abdomen and pelvis was performed following the standard protocol without IV contrast. COMPARISON:  None. FINDINGS: Please note that parenchymal abnormalities may be missed without intravenous contrast. Lower chest: No acute abnormality Hepatobiliary: The liver and gallbladder are unremarkable. There is no evidence of biliary dilatation. 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 There is no evidence of hydronephrosis or urinary calculi. The adrenal glands and bladder are unremarkable. Stomach/Bowel: A moderate amount of pneumoperitoneum throughout the mid and upper abdomen identified. A peritoneal dialysis catheter is noted. No bowel abnormalities are identified. No bowel wall thickening or bowel obstruction noted. There is no evidence of inflammatory changes around any bowel loops. Vascular/Lymphatic: Aortic atherosclerotic calcification noted without aneurysm. No enlarged lymph nodes identified. Reproductive: Prostate is unremarkable. Other: No ascites or abscess. Musculoskeletal: No acute or significant osseous findings. IMPRESSION: Moderate amount of pneumoperitoneum within the mid and upper abdomen. No bowel abnormality or inflammation is identified and pneumoperitoneum is most likely related to air from the patient's peritoneal dialysis. Correlate clinically and consider follow-up as bowel perforation it is difficult to entirely exclude. Abdominal aortic atherosclerosis. Horseshoe kidney. Electronically Signed   By: Harmon Pier  M.D.   On: 04/27/2016 18:24    Procedures Procedures (including critical care time)  Medications Ordered  in ED Medications  ondansetron (ZOFRAN) injection 4 mg (4 mg Intravenous Given 04/27/16 1739)  sodium chloride 0.9 % bolus 500 mL (0 mLs Intravenous Stopped 04/27/16 1826)  HYDROmorphone (DILAUDID) injection 0.5 mg (0.5 mg Intravenous Given 04/27/16 1810)     Initial Impression / Assessment and Plan / ED Course  I have reviewed the triage vital signs and the nursing notes.  Pertinent labs & imaging results that were available during my care of the patient were reviewed by me and considered in my medical decision making (see chart for details).     I spoke with Dr. Briant Cedar with nephrology. Patient will get peritoneal fluid removed cultured and cell count. He'll be started on antibiotics and admitted by medicine  Final Clinical Impressions(s) / ED Diagnoses   Final diagnoses:  Pain of upper abdomen    New Prescriptions New Prescriptions   No medications on file     Bethann Berkshire, MD 04/27/16 2133

## 2016-04-27 NOTE — Progress Notes (Signed)
Russell [name] 46 y.o.   Chief Complaint  Patient presents with  . Abdominal Pain    pain around dialisis port, pt was told he had a hernia in the same area, hot to touch per pt  . Nausea    HISTORY OF PRESENT ILLNESS: This is a 46 y.o. male on peritoneal dialysis c/o diffuse abdominal pain  Since yesterday; feels nauseous and glucose this am was 560. On arrival pt tachypneic and tachycardic.  Abdominal Pain  This is a new problem. The current episode started in the past 7 days. The onset quality is gradual. The problem occurs constantly. The problem has been gradually worsening. The pain is located in the generalized abdominal region. The pain is at a severity of 6/10. The pain is moderate. The quality of the pain is dull and a sensation of fullness. The abdominal pain does not radiate. Associated symptoms include nausea. Pertinent negatives include no diarrhea, fever, headaches, myalgias or vomiting. The pain is aggravated by movement. The pain is relieved by being still and certain positions.     Prior to Admission medications   Medication Sig Start Date End Date Taking? Authorizing Provider  acetaminophen (TYLENOL) 500 MG tablet Take 1 tablet (500 mg total) by mouth every 6 (six) hours as needed for moderate pain. Patient taking differently: Take 1,000 mg by mouth every 6 (six) hours as needed for moderate pain.  09/29/14  Yes Albertine Grates, MD  albuterol (PROVENTIL HFA;VENTOLIN HFA) 108 (90 Base) MCG/ACT inhaler Inhale 1 puff into the lungs every 6 (six) hours as needed for wheezing or shortness of breath. 03/11/15  Yes [name], MD  albuterol (PROVENTIL) (2.5 MG/3ML) 0.083% nebulizer solution Take 2.5 mg by nebulization every 6 (six) hours as needed for wheezing.   Yes Historical Provider, MD  albuterol-ipratropium (COMBIVENT) 18-103 MCG/ACT inhaler Inhale 2 puffs into the lungs every 4 (four) hours. Patient taking differently: Inhale 2 puffs into the lungs every 6 (six) hours as  needed for wheezing or shortness of breath.  09/03/14  Yes [name], MD  amLODipine (NORVASC) 10 MG tablet Take 1 tablet (10 mg total) by mouth daily. 04/17/16  Yes Shade Flood, MD  aspirin EC 81 MG EC tablet Take 1 tablet (81 mg total) by mouth daily. Patient taking differently: Take 81 mg by mouth every morning.  10/06/15  Yes Erin R Barrett, PA-C  atorvastatin (LIPITOR) 80 MG tablet Take 1 tablet (80 mg total) by mouth daily at 6 PM. 04/17/16  Yes Shade Flood, MD  calcitRIOL (ROCALTROL) 0.25 MCG capsule Take 1 capsule (0.25 mcg total) by mouth every Monday, Wednesday, and Friday with hemodialysis. 10/06/15  Yes Erin R Barrett, PA-C  calcium acetate (PHOSLO) 667 MG capsule Take 2 capsules (1,334 mg total) by mouth 3 (three) times daily with meals. 01/08/15  Yes Rolly Salter, MD  cefTAZidime 2 g in dextrose 5 % 50 mL Inject 2 g into the vein every Monday, Wednesday, and Friday. WITH HD SESSIONS x 2 weeks 03/10/16  Yes Bethany Molt, DO  clonazePAM (KLONOPIN) 2 MG tablet Take 1 mg by mouth at bedtime.    Yes Historical Provider, MD  clopidogrel (PLAVIX) 75 MG tablet Take 1 tablet (75 mg total) by mouth daily. 01/27/16  Yes Lewayne Bunting, MD  cyclobenzaprine (FLEXERIL) 10 MG tablet Take 1 tablet (10 mg total) by mouth at bedtime. 05/22/15  Yes [name], MD  gabapentin (NEURONTIN) 100 MG capsule Take 2 capsules (200 mg total)  by mouth 3 (three) times daily. Patient taking differently: Take 800 mg by mouth 2 (two) times daily.  05/22/15  Yes [name], MD  HYDROmorphone (DILAUDID) 2 MG tablet Take 1 tablet (2 mg total) by mouth every 8 (eight) hours as needed for severe pain. Patient taking differently: Take 2-4 mg by mouth every 8 (eight) hours as needed for severe pain.  05/22/15  Yes [name], MD  hydrOXYzine (ATARAX/VISTARIL) 25 MG tablet Take 25 mg by mouth 2 (two) times daily as needed for anxiety.    Yes Historical Provider, MD  insulin glargine (LANTUS) 100 UNIT/ML  injection Inject 0.3 mLs (30 Units total) into the skin 2 (two) times daily. 03/10/16  Yes Bethany Molt, DO  insulin lispro (HUMALOG) 100 UNIT/ML injection Inject 0.15-0.3 mLs (15-30 Units total) into the skin every evening. Take one unit per 5 carbs depending on dinner. Patient taking differently: Inject 15-30 Units into the skin 3 (three) times daily with meals. Take one unit per 5 carbs depending on dinner. 11/29/15  Yes Carlus Pavlov, MD  levothyroxine (SYNTHROID, LEVOTHROID) 50 MCG tablet Take 1 tablet (50 mcg total) by mouth daily. 05/22/15  Yes [name], MD  metoCLOPramide (REGLAN) 5 MG tablet Take 1 tablet (5 mg total) by mouth every 8 (eight) hours as needed for nausea or vomiting. 01/10/16  Yes Dwana Melena, DO  metoprolol tartrate (LOPRESSOR) 50 MG tablet Take 1 tablet (50 mg total) by mouth 2 (two) times daily. 04/17/16  Yes Shade Flood, MD  polyethylene glycol [facility]) packet Take 17 g by mouth daily. 07/20/15  Yes Srikar Sudini, MD  promethazine (PHENERGAN) 25 MG tablet Take 1 tablet (25 mg total) by mouth every 6 (six) hours as needed for nausea or vomiting. 01/08/16  Yes Kristen N Ward, DO  vancomycin 2,500 mg in sodium chloride 0.9 % 500 mL Inject 2,500 mg into the vein every Monday, Wednesday, and Friday with hemodialysis. 03/10/16  Yes Bethany Molt, DO    Allergies  Allergen Reactions  . Ibuprofen Other (See Comments)    MD told him not to take due to kidney disease.  . Nsaids Other (See Comments)    Told to avoid all nsaids due to kidney disease-takes acetaminophen as needed    Patient Active Problem List   Diagnosis Date Noted  . Peritonitis associated with peritoneal dialysis (HCC) 03/10/2016  . Abdominal pain 03/09/2016  . Hyperkalemia   . S/P CABG (coronary artery bypass graft) 09/28/2015  . Coronary artery disease involving native coronary artery of native heart with unstable angina pectoris (HCC)   . Hyperglycemia due to type 2 diabetes mellitus (HCC) 09/20/2015   . NSTEMI (non-ST elevated myocardial infarction) (HCC)   . Elevated troponin   . Angina pectoris (HCC) 07/20/2015  . Precordial pain 07/20/2015  . Hyperglycemia 07/20/2015  . DKA (diabetic ketoacidoses) (HCC) 02/13/2015  . ESRD on dialysis (HCC) 02/13/2015  . Nausea and vomiting 02/13/2015  . Nephrotic syndrome 01/03/2015  . Hypertensive urgency 12/30/2014  . Essential hypertension, benign   . Thyroid activity decreased   . Hx of gastroesophageal reflux (GERD) 09/30/2014  . Prolonged Q-T interval on ECG 09/30/2014  . Acute on chronic renal failure (HCC) 09/24/2014  . Diabetic neuropathy (HCC) 09/21/2014  . Uncontrolled diabetes mellitus type 2 with peripheral artery disease (HCC) 09/21/2014  . Morbid obesity (HCC) 09/21/2014  . Superficial thrombophlebitis 03/24/2014  . Hypothyroidism (acquired) 03/24/2014    Past Medical History:  Diagnosis Date  . Age-related macular degeneration, wet,  both eyes (HCC)    "I'm getting Alia treatments" (12/30/2014)  . Anxiety   . Asthma   . 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 Daily headache   . Depression   . DVT (deep venous thrombosis), H/o 01/2014-on Xarelto 03/24/2014   LLE  . Hypertension   . Hypothyroidism   . Nephrotic syndrome 05/18/2014  . NSTEMI (non-ST elevated myocardial infarction) (HCC)   . Pneumonia 11/2013  . Renal insufficiency   . Secondary DM with DKA-AG=16, BIcarb Nl 03/24/2014  . Type 2 diabetes mellitus with diabetic nephropathy [facility])     Past Surgical History:  Procedure Laterality Date  . ANKLE FRACTURE SURGERY Right 1988  . AV FISTULA PLACEMENT Left 01/01/2015   Procedure: CREATION OF LEFT RADIAL CEPHALIC ARTERIOVENOUS (AV) FISTULA ;  Surgeon: Pryor Ochoa, MD;  Location: [facility] OR;  Service: Vascular;  Laterality:  Left;  . CARDIAC CATHETERIZATION N/A 09/22/2015   Procedure: Left Heart Cath and Coronary Angiography;  Surgeon: Iran Ouch, MD;  Location: [facility] INVASIVE CV LAB;  Service: Cardiovascular;  Laterality: N/A;  . CORONARY ARTERY BYPASS GRAFT N/A 09/28/2015   Procedure: CORONARY ARTERY BYPASS GRAFTING (CABG) x 3 UTILIZING LEFT MAMMARY ARTERY AND ENDOSCOPICALLY HARVESTED LEFT GREATER SAPHENOUS VEIN.;  Surgeon: Delight Ovens, MD;  Location: [facility] OR;  Service: Open Heart Surgery;  Laterality: N/A;  . ENDOVEIN HARVEST OF GREATER SAPHENOUS VEIN Left 09/28/2015   Procedure: ENDOVEIN HARVEST OF GREATER SAPHENOUS VEIN;  Surgeon: Delight Ovens, MD;  Location: [facility] OR;  Service: Open Heart Surgery;  Laterality: Left;  . ESOPHAGOGASTRODUODENOSCOPY N/A 03/29/2014   Procedure: ESOPHAGOGASTRODUODENOSCOPY (EGD);  Surgeon: Dorena Cookey, MD;  Location: [facility];  Service: Endoscopy;  Laterality: N/A;  . EYE SURGERY    . FRACTURE SURGERY    . INSERTION OF DIALYSIS CATHETER Right 01/05/2015   Procedure: INSERTION OF RIGHT INTERNAL JUGULAR DIALYSIS CATHETER;  Surgeon: Fransisco Hertz, MD;  Location: [facility] OR;  Service: Vascular;  Laterality: Right;  . TEE WITHOUT CARDIOVERSION N/A 09/28/2015   Procedure: TRANSESOPHAGEAL ECHOCARDIOGRAM (TEE);  Surgeon: Delight Ovens, MD;  Location: [facility] OR;  Service: Open Heart Surgery;  Laterality: N/A;  . TONSILLECTOMY AND ADENOIDECTOMY  1970s    Social History   Social History  . Marital status: Single    Spouse name: N/A  . Number of children: N/A  . Years of education: N/A   Occupational History  .      Maintenance Curator   Social History Main Topics  . Smoking status: Former Smoker    Types: Cigarettes    Quit date: 03/17/2016  . Smokeless tobacco: Never Used  . Alcohol use 0.0 oz/week  . Drug use: No  . Sexual activity: Not Currently   Other Topics Concern  . Not on file   Social History Narrative   Patient currently lives with his fiance   Works with  facilities   Nonsmoker, nondrinker       Family History  Problem Relation Age of Onset  . Obesity Mother     Patient states that family members have no other medical illnesses other than what I have described  . Kidney cancer Maternal Grandmother   . Cancer Father 50    AML  . Heart disease  No family history     Review of Systems  Constitutional: Positive for chills and malaise/fatigue. Negative for fever.  HENT: Negative.   Eyes: Negative.   Respiratory: Negative for cough and shortness of breath.   Cardiovascular: Negative for chest pain and palpitations.  Gastrointestinal: Positive for abdominal pain and nausea. Negative for diarrhea and vomiting.  Genitourinary: Negative for flank pain.  Musculoskeletal: Negative for back pain, myalgias and neck pain.  Skin: Negative for rash.  Neurological: Positive for weakness. Negative for dizziness, sensory change, focal weakness and headaches.  Endo/Heme/Allergies: Negative.    Vitals:   04/27/16 1533  BP: 116/71  Pulse: (!) 121  Resp: 18  Temp: 98.9 F (37.2 C)   Repeat BP 96/59  HR 120  Physical Exam  Constitutional: He is oriented to person, place, and time. He appears well-developed and well-nourished. He appears ill.  HENT:  Head: Normocephalic and atraumatic.  Eyes: EOM are normal. Pupils are equal, round, and reactive to light.  Neck: Normal range of motion. Neck supple. No JVD present.  Cardiovascular: Regular rhythm.   Tachycardic and weak pulse  Pulmonary/Chest:  tachypneic; shallow and rapid  Abdominal: He exhibits distension. There is tenderness (diffuse). There is rebound and guarding.  Musculoskeletal: Normal range of motion.  Lymphadenopathy:    He has no cervical adenopathy.  Neurological: He is alert and oriented to person, place, and time. No sensory deficit. He exhibits normal muscle tone.  Skin: Skin is warm and dry. Capillary refill takes less than 2 seconds. No rash noted.  Psychiatric: He  has a normal mood and affect. His behavior is normal.  Vitals reviewed.    ASSESSMENT & PLAN: Russell [name] was seen today for abdominal pain and nausea.  Diagnoses and all orders for this visit:  Generalized abdominal pain Comments: suspected peritonitis  Orders: -     POCT CBC  Tachycardia  Tachypnea  Hyperglycemia -     POCT glucose (manual entry)  Hypotension, unspecified hypotension type  Type 2 diabetes mellitus with other diabetic kidney complication (HCC)  ESRD on dialysis [facility])   Needs further evaluation and treatment in the ER. Transport via ambulance. Case d/w paramedics on arrival.   Edwina Barth, MD [facility]

## 2016-04-27 NOTE — ED Triage Notes (Signed)
Pt presents with 2 day h/o abdominal pain with nausea, vomiting and diarrhea.  Pt is on peritoneal dialysis, denies any cloudy or sediment in exchange fluid.

## 2016-04-27 NOTE — Patient Instructions (Signed)
     IF you received an x-ray today, you will receive an invoice from [facility]. Please contact [facility] at [phone] with questions or concerns regarding your invoice.   IF you received labwork today, you will receive an invoice from [facility]. Please contact [facility] at [phone] with questions or concerns regarding your invoice.   Our billing staff will not be able to assist you with questions regarding bills from these companies.  You will be contacted with the lab results as soon as they are available. The fastest way to get your results is to activate your My Chart account. Instructions are located on the last page of this paperwork. If you have not heard from us regarding the results in 2 weeks, please contact this office.     

## 2016-04-28 DIAGNOSIS — I120 Hypertensive chronic kidney disease with stage 5 chronic kidney disease or end stage renal disease: Secondary | ICD-10-CM | POA: Diagnosis not present

## 2016-04-28 DIAGNOSIS — T8571XA Infection and inflammatory reaction due to peritoneal dialysis catheter, initial encounter: Secondary | ICD-10-CM

## 2016-04-28 DIAGNOSIS — N2581 Secondary hyperparathyroidism of renal origin: Secondary | ICD-10-CM | POA: Diagnosis not present

## 2016-04-28 DIAGNOSIS — E1129 Type 2 diabetes mellitus with other diabetic kidney complication: Secondary | ICD-10-CM | POA: Diagnosis not present

## 2016-04-28 DIAGNOSIS — R0602 Shortness of breath: Secondary | ICD-10-CM | POA: Diagnosis not present

## 2016-04-28 DIAGNOSIS — Z992 Dependence on renal dialysis: Secondary | ICD-10-CM | POA: Diagnosis not present

## 2016-04-28 DIAGNOSIS — N2589 Other disorders resulting from impaired renal tubular function: Secondary | ICD-10-CM | POA: Diagnosis not present

## 2016-04-28 DIAGNOSIS — D509 Iron deficiency anemia, unspecified: Secondary | ICD-10-CM | POA: Diagnosis not present

## 2016-04-28 DIAGNOSIS — E440 Moderate protein-calorie malnutrition: Secondary | ICD-10-CM | POA: Diagnosis not present

## 2016-04-28 DIAGNOSIS — K659 Peritonitis, unspecified: Secondary | ICD-10-CM | POA: Diagnosis not present

## 2016-04-28 DIAGNOSIS — N186 End stage renal disease: Secondary | ICD-10-CM | POA: Diagnosis not present

## 2016-04-28 DIAGNOSIS — T8029XA Infection following other infusion, transfusion and therapeutic injection, initial encounter: Secondary | ICD-10-CM | POA: Diagnosis not present

## 2016-04-28 DIAGNOSIS — Q631 Lobulated, fused and horseshoe kidney: Secondary | ICD-10-CM | POA: Diagnosis not present

## 2016-04-28 LAB — CBC
HCT: 25.8 % — ABNORMAL LOW (ref 39.0–52.0)
HEMOGLOBIN: 8.6 g/dL — AB (ref 13.0–17.0)
MCH: 28 pg (ref 26.0–34.0)
MCHC: 33.3 g/dL (ref 30.0–36.0)
MCV: 84 fL (ref 78.0–100.0)
Platelets: 284 10*3/uL (ref 150–400)
RBC: 3.07 MIL/uL — AB (ref 4.22–5.81)
RDW: 15 % (ref 11.5–15.5)
WBC: 7.1 10*3/uL (ref 4.0–10.5)

## 2016-04-28 LAB — BODY FLUID CELL COUNT WITH DIFFERENTIAL
EOS FL: 2 %
LYMPHS FL: 8 %
MONOCYTE-MACROPHAGE-SEROUS FLUID: 46 % — AB (ref 50–90)
NEUTROPHIL FLUID: 44 % — AB (ref 0–25)
WBC FLUID: 27 uL (ref 0–1000)

## 2016-04-28 LAB — COMPREHENSIVE METABOLIC PANEL
ALK PHOS: 85 U/L (ref 38–126)
ALT: 15 U/L — ABNORMAL LOW (ref 17–63)
ANION GAP: 15 (ref 5–15)
AST: 16 U/L (ref 15–41)
Albumin: 2.6 g/dL — ABNORMAL LOW (ref 3.5–5.0)
BILIRUBIN TOTAL: 0.4 mg/dL (ref 0.3–1.2)
BUN: 77 mg/dL — ABNORMAL HIGH (ref 6–20)
CO2: 23 mmol/L (ref 22–32)
Calcium: 7.5 mg/dL — ABNORMAL LOW (ref 8.9–10.3)
Chloride: 100 mmol/L — ABNORMAL LOW (ref 101–111)
Creatinine, Ser: 10.44 mg/dL — ABNORMAL HIGH (ref 0.61–1.24)
GFR, EST AFRICAN AMERICAN: 6 mL/min — AB (ref >60)
GFR, EST NON AFRICAN AMERICAN: 5 mL/min — AB (ref >60)
Glucose, Bld: 94 mg/dL (ref 65–99)
POTASSIUM: 4 mmol/L (ref 3.5–5.1)
Sodium: 138 mmol/L (ref 135–145)
TOTAL PROTEIN: 5.6 g/dL — AB (ref 6.5–8.1)

## 2016-04-28 LAB — GRAM STAIN

## 2016-04-28 LAB — GLUCOSE, CAPILLARY
GLUCOSE-CAPILLARY: 78 mg/dL (ref 65–99)
Glucose-Capillary: 170 mg/dL — ABNORMAL HIGH (ref 65–99)

## 2016-04-28 LAB — LACTIC ACID, PLASMA: Lactic Acid, Venous: 0.8 mmol/L (ref 0.5–1.9)

## 2016-04-28 LAB — PATHOLOGIST SMEAR REVIEW

## 2016-04-28 LAB — PHOSPHORUS: Phosphorus: 10.1 mg/dL — ABNORMAL HIGH (ref 2.5–4.6)

## 2016-04-28 MED ORDER — CIPROFLOXACIN HCL 500 MG PO TABS: 500.0000 mg | ORAL_TABLET | Freq: Every day | ORAL | 0 refills | Status: DC

## 2016-04-28 MED ORDER — DOXYCYCLINE HYCLATE 50 MG PO CAPS: 50.0000 mg | ORAL_CAPSULE | Freq: Two times a day (BID) | ORAL | 0 refills | Status: DC

## 2016-04-28 NOTE — Progress Notes (Signed)
Patient ID: Russell [name], male   DOB: Sep 03, 1970, 46 y.o.   MRN: [id number]  [facility] Progress Note   Assessment/ Plan:   1. Abdominal pain: CT scan negative for any clear intra-abdominal pathology and PD fluid WBC count not suggestive of peritonitis. He completed intraperitoneal vancomycin on 3/23 and has not recently been on oral antibiotics to explain the low WBC count from possible incomplete treatment. Discussed with the patient-will empirically treat this as peritonitis versus tunnel site infection with oral ciprofloxacin 500 mg daily and doxycycline 100 mg twice a day for the next 10 days. Should he have recurrent abdominal pain, will need to have peritoneal dialysis catheter removed and transitioned back to hemodialysis. 2. ESRD: To resume CCPD upon discharge later today. He appears to be close to euvolemic and without any concerning symptoms. 3. Anemia: Low hemoglobin level noted, continue ESA while on peritoneal dialysis. 4. CKD-MBD: Significantly elevated phosphorus levels-discussed low phosphorus diet and compliance with phosphorus binders at length. 5. Nutrition: Discussed in protein intake/nutritional supplementation while on PD. 6. Hypertension: Blood pressure is acceptable at this time.  Subjective:   Reports tolerable abdominal pain overnight-did not have CCPD per his request. He would like to go home today so that he can celebrate his son's 15th birthday.    Objective:   BP 100/68 (BP Location: Right Arm)   Pulse 88   Temp 98 F (36.7 C) (Oral)   Resp 20   Ht  (1.88 m)   Wt 125.8 kg (277 lb 5.4 oz)   SpO2 97%   BMI 35.61 kg/m   Physical Exam: ION:[id number] resting in bed CVS: Pulse regular rhythm, normal rate, S1 and S2 normal Resp: Diminished breath sounds over bases otherwise clear to auscultation anteriorly Abd: Soft, obese, mildly tender primarily over the lower quadrants Ext: Trace lower extremity edema  Labs: BMET  Recent  Labs Lab 04/27/16 1744 04/27/16 2200 04/28/16 0445  NA 139  --  138  K 3.7  --  4.0  CL 100*  --  100*  CO2 23  --  23  GLUCOSE 102*  --  94  BUN 75*  --  77*  CREATININE 9.90*  --  10.44*  CALCIUM 7.7*  --  7.5*  PHOS  --  7.9* 10.1*   CBC  Recent Labs Lab 04/27/16 1601 04/27/16 1745 04/28/16 0445  WBC 8.5 8.0 7.1  NEUTROABS  --  5.7  --   HGB 9.9* 9.3* 8.6*  HCT 28.9* 27.8* 25.8*  MCV 83.2 82.7 84.0  PLT  --  329 284   Medications:    . amLODipine  10 mg Oral Daily  . aspirin EC  81 mg Oral Daily  . atorvastatin  80 mg Oral q1800  . calcitRIOL  0.25 mcg Oral Q M,W,F-HD  . calcium acetate  1,334 mg Oral TID WC  . clonazePAM  1 mg Oral QHS  . clopidogrel  75 mg Oral Daily  . cyclobenzaprine  10 mg Oral QHS  . enoxaparin (LOVENOX) injection  30 mg Subcutaneous Daily  . gabapentin  800 mg Oral BID  . insulin aspart  0-5 Units Subcutaneous QHS  . insulin aspart  0-9 Units Subcutaneous TID WC  . insulin glargine  30 Units Subcutaneous BID  . levothyroxine  50 mcg Oral QAC breakfast  . lisinopril  20 mg Oral Daily  . metoCLOPramide  5 mg Oral BID WC  . metoprolol  50 mg Oral BID  . multivitamin  1 tablet Oral QHS   Zetta Bills, MD 04/28/2016, 9:49 AM

## 2016-04-28 NOTE — Progress Notes (Signed)
2 liter 1.5% dextrose In and Out exchange obtained for  Effluent sample.  Effluent clear with no sediment noted. Requisition and sample walked to lab.

## 2016-04-28 NOTE — Progress Notes (Signed)
New Admission Note:  Arrival Method: Stretcher  Mental Orientation: Alert and oriented x 4  Telemetry: N/A Assessment: Completed Skin: Warm and dry  IV: NSL  Pain: 9/10 Medicated for pain  Tubes: PD cath  Safety Measures: Safety Fall Prevention Plan was given, discussed and signed. Admission: Completed [address] Orientation: Patient has been orientated to the room, unit and the staff. Family: None   Orders have been reviewed and implemented. Will continue to monitor the patient. Call light has been placed within reach and bed alarm has been activated.   Guilford Shi BSN, RN  Phone Number: [phone]

## 2016-04-28 NOTE — Discharge Summary (Signed)
Physician Discharge Summary  Russell [name] QIO:[id number] DOB: Apr 14, 1970 DOA: 04/27/2016  PCP: Shade Flood, MD  Admit date: 04/27/2016 Discharge date: 04/28/2016  Time spent: 25 minutes  Recommendations for Outpatient Follow-up:  1. Finish doxy and cipro as per Renal for possible peritonitis 2. Needs OP labsonce reviewed 3. Needs chr pain managed and refills per whoever has rx in the first place  Discharge Diagnoses:  Principal Problem:   Peritoneal dialysis-associated peritonitis (HCC) Active Problems:   Hypothyroidism (acquired)   Uncontrolled diabetes mellitus type 2 with peripheral artery disease (HCC)   Morbid obesity (HCC)   Essential hypertension, benign   ESRD on dialysis (HCC)   Anemia, chronic renal failure, stage 5 (HCC)   Discharge Condition: imporved  Diet recommendation:  hh low salt  Filed Weights   04/27/16 2245 04/28/16 0030  Weight: 125.8 kg (277 lb 5.4 oz) 125.8 kg (277 lb 5.4 oz)    History of present illness:  ESRD preior on PD since 03/2016-previously on HD ty II DM CAD NSTEMI - 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/28/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  Culture can be fllowed as OP nephro ok with him going home on oral doxy/cipro 10 days Will need close follow up  ESRD on PD To d/w neprhology-unclear if good lonstanding candidate for PD  CAd with CABG 09/2015 Continue metoprolol 50 bid, Lisinopril  20 Cont Plavix 75 qd, asa 81 mg daILY  ty II DM with compliaction gastroparesis and nephrolpathy Cont LAntus 30 bid, SSI Cont reglan 5 bid Cont neurontin as OP  htn  Cont Amlodipine 10 qd  Consultations:  nephrology  Discharge Exam: Vitals:   04/28/16 0505 04/28/16 0932  BP: 131/90 100/68  Pulse:  88  Resp: 18 20  Temp: 97.8 F (36.6 C) 98 F (36.7 C)    General:  eomi obese ncat Cardiovascular: s1 s2n o m/r/g Respiratory: clear  abd soft slight tende rno rebound  Discharge Instructions   Discharge Instructions    Diet - low sodium heart healthy    Complete by:  As directed    Discharge instructions    Complete by:  As directed    Please complete cipro and doxycycline follow up with your regular MD   Increase activity slowly    Complete by:  As directed      Current Discharge Medication List    START taking these medications   Details  ciprofloxacin (CIPRO) 500 MG tablet Take 1 tablet (500 mg total) by mouth daily with breakfast. Qty: 10 tablet, Refills: 0    doxycycline (VIBRAMYCIN) 50 MG capsule Take 1 capsule (50 mg total) by mouth 2 (two) times daily. Qty: 20 capsule, Refills: 0      CONTINUE these medications which have NOT CHANGED   Details  acetaminophen (TYLENOL) 500 MG tablet Take 1 tablet (500 mg total) by mouth every 6 (six) hours as needed for moderate pain. Qty: 30 tablet, Refills: 0    albuterol (PROVENTIL  HFA;VENTOLIN HFA) 108 (90 Base) MCG/ACT inhaler Inhale 1 puff into the lungs every 6 (six) hours as needed for wheezing or shortness of breath. Qty: 1 Inhaler, Refills: 11   Associated Diagnoses: Asthma, mild persistent, uncomplicated    albuterol (PROVENTIL) (2.5 MG/3ML) 0.083% nebulizer solution Take 2.5 mg by nebulization every 6 (six) hours as needed for wheezing.    albuterol-ipratropium (COMBIVENT) 18-103 MCG/ACT inhaler Inhale 2 puffs into the lungs every 4 (four) hours. Qty: 1 Inhaler, Refills: 11   Associated Diagnoses:  Asthma, chronic, mild persistent, uncomplicated    amLODipine (NORVASC) 10 MG tablet Take 1 tablet (10 mg total) by mouth daily. Qty: 90 tablet, Refills: 1   Associated Diagnoses: Essential hypertension    aspirin EC 81 MG EC tablet Take 1 tablet (81 mg total) by mouth daily.    atorvastatin (LIPITOR) 80 MG tablet Take 1 tablet (80 mg total) by mouth daily at 6 PM. Qty: 90 tablet, Refills: 1   Associated Diagnoses: Hyperlipidemia, unspecified hyperlipidemia type    calcitRIOL (ROCALTROL) 0.25 MCG capsule Take 1 capsule (0.25 mcg total) by mouth every Monday, Wednesday, and Friday with hemodialysis. Qty: 30 capsule, Refills: 3    calcium acetate (PHOSLO) 667 MG capsule Take 2 capsules (1,334 mg total) by mouth 3 (three) times daily with meals. Qty: 180 capsule, Refills: 0    clonazePAM (KLONOPIN) 2 MG tablet Take 1 mg by mouth at bedtime.     citalopram (CELEXA) 40 MG tablet Take 60 mg by mouth at bedtime. FOR DEPRESSION/ANXIETY Refills: 2    clopidogrel (PLAVIX) 75 MG tablet Take 1 tablet (75 mg total) by mouth daily. Qty: 90 tablet, Refills: 3    cyclobenzaprine (FLEXERIL) 10 MG tablet Take 1 tablet (10 mg total) by mouth at bedtime. Qty: 30 tablet, Refills: 11   Associated Diagnoses: Myalgia    !! gabapentin (NEURONTIN) 100 MG capsule Take 2 capsules (200 mg total) by mouth 3 (three) times daily. Qty: 180 capsule, Refills: 11    !! gabapentin (NEURONTIN) 100 MG capsule Take 200 mg by mouth 4 (four) times daily as needed.    HYDROmorphone (DILAUDID) 2 MG tablet Take 1 tablet (2 mg total) by mouth every 8 (eight) hours as needed for severe pain. Qty: 90 tablet, Refills: 0   Associated Diagnoses: Leg pain, bilateral    hydrOXYzine (ATARAX/VISTARIL) 25 MG tablet Take 25 mg by mouth 2 (two) times daily as needed for anxiety.     insulin aspart (NOVOLOG) 100 UNIT/ML injection Inject 2-10 Units into the skin 3 (three) times daily with meals. PER SLIDING SCALE    insulin glargine  (LANTUS) 100 UNIT/ML injection Inject 0.3 mLs (30 Units total) into the skin 2 (two) times daily. Qty: 10 mL, Refills: 11    insulin lispro (HUMALOG) 100 UNIT/ML injection Inject 0.15-0.3 mLs (15-30 Units total) into the skin every evening. Take one unit per 5 carbs depending on dinner. Qty: 10 mL, Refills: 2   Associated Diagnoses: Uncontrolled diabetes mellitus with peripheral artery disease (HCC)    levothyroxine (SYNTHROID, LEVOTHROID) 50 MCG tablet Take 1 tablet (50 mcg total) by mouth daily. Qty: 90 tablet, Refills: 3   Associated Diagnoses: Hypothyroidism, unspecified hypothyroidism type    metoCLOPramide (REGLAN) 5 MG tablet Take 1 tablet (5 mg total) by mouth every 8 (eight) hours as needed for nausea or vomiting. Qty: 30 tablet, Refills: 0   Associated Diagnoses: Gastroparesis    metoprolol (LOPRESSOR) 50 MG tablet Take 50 mg by mouth 2 (two) times  daily.    polyethylene glycol (MIRALAX) packet Take 17 g by mouth daily. Qty: 14 each, Refills: 0    promethazine (PHENERGAN) 25 MG tablet Take 1 tablet (25 mg total) by mouth every 6 (six) hours as needed for nausea or vomiting. Qty: 15 tablet, Refills: 0     !! - Potential duplicate medications found. Please discuss with provider.     Allergies  Allergen Reactions  . Ibuprofen Other (See Comments)    MD told patient not to take due to kidney disease  . Nsaids Other (See Comments)    Told to avoid all nsaids due to kidney disease   . Tape Other (See Comments)    Welts result, if left for a long amount of time      The results of significant diagnostics from this hospitalization (including imaging, microbiology, ancillary and laboratory) are listed below for reference.    Significant Diagnostic Studies: Ct Abdomen Pelvis Wo Contrast  Result Date: 04/27/2016 CLINICAL DATA:  46 year old male with abdominal and pelvic pain with nausea for 3 days. EXAM: CT ABDOMEN AND PELVIS WITHOUT CONTRAST TECHNIQUE: Multidetector CT  imaging of the abdomen and pelvis was performed following the standard protocol without IV contrast. COMPARISON:  None. FINDINGS: Please note that parenchymal abnormalities may be missed without intravenous contrast. Lower chest: No acute abnormality Hepatobiliary: The liver and gallbladder are unremarkable. There is no evidence of biliary dilatation. 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 There is no evidence of hydronephrosis or urinary calculi. The adrenal glands and bladder are unremarkable. Stomach/Bowel: A moderate amount of pneumoperitoneum throughout the mid and upper abdomen identified. A peritoneal dialysis catheter is noted. No bowel abnormalities are identified. No bowel wall thickening or bowel obstruction noted. There is no evidence of inflammatory changes around any bowel loops. Vascular/Lymphatic: Aortic atherosclerotic calcification noted without aneurysm. No enlarged lymph nodes identified. Reproductive: Prostate is unremarkable. Other: No ascites or abscess. Musculoskeletal: No acute or significant osseous findings. IMPRESSION: Moderate amount of pneumoperitoneum within the mid and upper abdomen. No bowel abnormality or inflammation is identified and pneumoperitoneum is most likely related to air from the patient's peritoneal dialysis. Correlate clinically and consider follow-up as bowel perforation it is difficult to entirely exclude. Abdominal aortic atherosclerosis. Horseshoe kidney. Electronically Signed   By: Harmon Pier M.D.   On: 04/27/2016 18:24   Dg Abd 1 View  Result Date: 04/28/2016 CLINICAL DATA:  Abdominal pain x2 days EXAM: ABDOMEN - 1 VIEW COMPARISON:  Same day CT FINDINGS: Peritoneal dialysis catheter traverses the left hemiabdomen with tip terminating in the pelvis. Visualization of both sides of bowel wall involving a segment of bowel in the left hemiabdomen would be consistent with pneumoperitoneum at noted on  same day CT possibly related to the patient's peritoneal dialysis. No bowel obstruction is seen. Short term interval surveillance and follow-up radiographs or CT is recommended if there is clinical concern for bowel perforation. No radio-opaque calculi nor acute osseous abnormality. Median sternotomy sutures are present. IMPRESSION: Subtle changes of pneumoperitoneum possibly related to peritoneal dialysis. No bowel obstruction noted. No significant accumulation of free air is apparent radiographically. Short-term interval follow-up radiographs or repeat CT may help to assure stability. Electronically Signed   By: Tollie Eth M.D.   On: 04/28/2016 00:01    Microbiology: Recent Results (from the past 240 hour(s))  Gram stain     Status: None   Collection Time: 04/28/16 12:30 AM  Result Value Ref Range Status  Specimen Description PERITONEAL DIALYSATE  Final   Special Requests NONE  Final   Gram Stain   Final    WBC PRESENT,BOTH PMN AND MONONUCLEAR NO ORGANISMS SEEN CYTOSPIN SMEAR    Report Status 04/28/2016 FINAL  Final     Labs: Basic Metabolic Panel:  Recent Labs Lab 04/27/16 1744 04/27/16 2200 04/28/16 0445  NA 139  --  138  K 3.7  --  4.0  CL 100*  --  100*  CO2 23  --  23  GLUCOSE 102*  --  94  BUN 75*  --  77*  CREATININE 9.90*  --  10.44*  CALCIUM 7.7*  --  7.5*  PHOS  --  7.9* 10.1*   Liver Function Tests:  Recent Labs Lab 04/27/16 1744 04/28/16 0445  AST 19 16  ALT 16* 15*  ALKPHOS 101 85  BILITOT 0.3 0.4  PROT 6.1* 5.6*  ALBUMIN 2.7* 2.6*    Recent Labs Lab 04/27/16 1744  LIPASE 37   No results for input(s): AMMONIA in the last 168 hours. CBC:  Recent Labs Lab 04/27/16 1601 04/27/16 1745 04/28/16 0445  WBC 8.5 8.0 7.1  NEUTROABS  --  5.7  --   HGB 9.9* 9.3* 8.6*  HCT 28.9* 27.8* 25.8*  MCV 83.2 82.7 84.0  PLT  --  329 284   Cardiac Enzymes: No results for input(s): CKTOTAL, CKMB, CKMBINDEX, TROPONINI in the last 168 hours. BNP: BNP  (last 3 results)  Recent Labs  01/10/16 1834  BNP 182.8*    ProBNP (last 3 results) No results for input(s): PROBNP in the last 8760 hours.  CBG:  Recent Labs Lab 04/27/16 2219 04/27/16 2300 04/28/16 0805  GLUCAP 95 107* 78       Signed:  Rhetta Mura MD   [facility] 04/28/2016, 10:20 AM

## 2016-04-29 DIAGNOSIS — N2589 Other disorders resulting from impaired renal tubular function: Secondary | ICD-10-CM | POA: Diagnosis not present

## 2016-04-29 DIAGNOSIS — E1129 Type 2 diabetes mellitus with other diabetic kidney complication: Secondary | ICD-10-CM | POA: Diagnosis not present

## 2016-04-29 DIAGNOSIS — E440 Moderate protein-calorie malnutrition: Secondary | ICD-10-CM | POA: Diagnosis not present

## 2016-04-29 DIAGNOSIS — N2581 Secondary hyperparathyroidism of renal origin: Secondary | ICD-10-CM | POA: Diagnosis not present

## 2016-04-29 DIAGNOSIS — N186 End stage renal disease: Secondary | ICD-10-CM | POA: Diagnosis not present

## 2016-04-29 DIAGNOSIS — D509 Iron deficiency anemia, unspecified: Secondary | ICD-10-CM | POA: Diagnosis not present

## 2016-04-30 DIAGNOSIS — D509 Iron deficiency anemia, unspecified: Secondary | ICD-10-CM | POA: Diagnosis not present

## 2016-04-30 DIAGNOSIS — N186 End stage renal disease: Secondary | ICD-10-CM | POA: Diagnosis not present

## 2016-04-30 DIAGNOSIS — N2589 Other disorders resulting from impaired renal tubular function: Secondary | ICD-10-CM | POA: Diagnosis not present

## 2016-04-30 DIAGNOSIS — E440 Moderate protein-calorie malnutrition: Secondary | ICD-10-CM | POA: Diagnosis not present

## 2016-04-30 DIAGNOSIS — E1129 Type 2 diabetes mellitus with other diabetic kidney complication: Secondary | ICD-10-CM | POA: Diagnosis not present

## 2016-04-30 DIAGNOSIS — N2581 Secondary hyperparathyroidism of renal origin: Secondary | ICD-10-CM | POA: Diagnosis not present

## 2016-05-01 DIAGNOSIS — N049 Nephrotic syndrome with unspecified morphologic changes: Secondary | ICD-10-CM | POA: Diagnosis not present

## 2016-05-01 DIAGNOSIS — N186 End stage renal disease: Secondary | ICD-10-CM | POA: Diagnosis not present

## 2016-05-01 DIAGNOSIS — E440 Moderate protein-calorie malnutrition: Secondary | ICD-10-CM | POA: Diagnosis not present

## 2016-05-01 DIAGNOSIS — D509 Iron deficiency anemia, unspecified: Secondary | ICD-10-CM | POA: Diagnosis not present

## 2016-05-01 DIAGNOSIS — N2589 Other disorders resulting from impaired renal tubular function: Secondary | ICD-10-CM | POA: Diagnosis not present

## 2016-05-01 DIAGNOSIS — E1129 Type 2 diabetes mellitus with other diabetic kidney complication: Secondary | ICD-10-CM | POA: Diagnosis not present

## 2016-05-01 DIAGNOSIS — Z992 Dependence on renal dialysis: Secondary | ICD-10-CM | POA: Diagnosis not present

## 2016-05-01 DIAGNOSIS — N2581 Secondary hyperparathyroidism of renal origin: Secondary | ICD-10-CM | POA: Diagnosis not present

## 2016-05-02 DIAGNOSIS — D631 Anemia in chronic kidney disease: Secondary | ICD-10-CM | POA: Diagnosis not present

## 2016-05-02 DIAGNOSIS — E440 Moderate protein-calorie malnutrition: Secondary | ICD-10-CM | POA: Diagnosis not present

## 2016-05-02 DIAGNOSIS — D509 Iron deficiency anemia, unspecified: Secondary | ICD-10-CM | POA: Diagnosis not present

## 2016-05-02 DIAGNOSIS — Z79899 Other long term (current) drug therapy: Secondary | ICD-10-CM | POA: Diagnosis not present

## 2016-05-02 DIAGNOSIS — N2581 Secondary hyperparathyroidism of renal origin: Secondary | ICD-10-CM | POA: Diagnosis not present

## 2016-05-02 DIAGNOSIS — R17 Unspecified jaundice: Secondary | ICD-10-CM | POA: Diagnosis not present

## 2016-05-02 DIAGNOSIS — N2589 Other disorders resulting from impaired renal tubular function: Secondary | ICD-10-CM | POA: Diagnosis not present

## 2016-05-02 DIAGNOSIS — E1129 Type 2 diabetes mellitus with other diabetic kidney complication: Secondary | ICD-10-CM | POA: Diagnosis not present

## 2016-05-02 DIAGNOSIS — N186 End stage renal disease: Secondary | ICD-10-CM | POA: Diagnosis not present

## 2016-05-03 DIAGNOSIS — D509 Iron deficiency anemia, unspecified: Secondary | ICD-10-CM | POA: Diagnosis not present

## 2016-05-03 DIAGNOSIS — Z79899 Other long term (current) drug therapy: Secondary | ICD-10-CM | POA: Diagnosis not present

## 2016-05-03 DIAGNOSIS — E440 Moderate protein-calorie malnutrition: Secondary | ICD-10-CM | POA: Diagnosis not present

## 2016-05-03 DIAGNOSIS — N186 End stage renal disease: Secondary | ICD-10-CM | POA: Diagnosis not present

## 2016-05-03 DIAGNOSIS — R17 Unspecified jaundice: Secondary | ICD-10-CM | POA: Diagnosis not present

## 2016-05-03 DIAGNOSIS — E1129 Type 2 diabetes mellitus with other diabetic kidney complication: Secondary | ICD-10-CM | POA: Diagnosis not present

## 2016-05-03 LAB — CULTURE, BLOOD (SINGLE)
Culture: NO GROWTH
Special Requests: ADEQUATE

## 2016-05-03 LAB — CULTURE, BODY FLUID W GRAM STAIN -BOTTLE: Culture: NO GROWTH

## 2016-05-03 LAB — CULTURE, BODY FLUID-BOTTLE

## 2016-05-04 DIAGNOSIS — D509 Iron deficiency anemia, unspecified: Secondary | ICD-10-CM | POA: Diagnosis not present

## 2016-05-04 DIAGNOSIS — E440 Moderate protein-calorie malnutrition: Secondary | ICD-10-CM | POA: Diagnosis not present

## 2016-05-04 DIAGNOSIS — Z79899 Other long term (current) drug therapy: Secondary | ICD-10-CM | POA: Diagnosis not present

## 2016-05-04 DIAGNOSIS — R17 Unspecified jaundice: Secondary | ICD-10-CM | POA: Diagnosis not present

## 2016-05-04 DIAGNOSIS — E1129 Type 2 diabetes mellitus with other diabetic kidney complication: Secondary | ICD-10-CM | POA: Diagnosis not present

## 2016-05-04 DIAGNOSIS — N186 End stage renal disease: Secondary | ICD-10-CM | POA: Diagnosis not present

## 2016-05-05 DIAGNOSIS — E440 Moderate protein-calorie malnutrition: Secondary | ICD-10-CM | POA: Diagnosis not present

## 2016-05-05 DIAGNOSIS — D509 Iron deficiency anemia, unspecified: Secondary | ICD-10-CM | POA: Diagnosis not present

## 2016-05-05 DIAGNOSIS — Z79899 Other long term (current) drug therapy: Secondary | ICD-10-CM | POA: Diagnosis not present

## 2016-05-05 DIAGNOSIS — E1129 Type 2 diabetes mellitus with other diabetic kidney complication: Secondary | ICD-10-CM | POA: Diagnosis not present

## 2016-05-05 DIAGNOSIS — R17 Unspecified jaundice: Secondary | ICD-10-CM | POA: Diagnosis not present

## 2016-05-05 DIAGNOSIS — N186 End stage renal disease: Secondary | ICD-10-CM | POA: Diagnosis not present

## 2016-05-06 DIAGNOSIS — Z79899 Other long term (current) drug therapy: Secondary | ICD-10-CM | POA: Diagnosis not present

## 2016-05-06 DIAGNOSIS — N186 End stage renal disease: Secondary | ICD-10-CM | POA: Diagnosis not present

## 2016-05-06 DIAGNOSIS — E440 Moderate protein-calorie malnutrition: Secondary | ICD-10-CM | POA: Diagnosis not present

## 2016-05-06 DIAGNOSIS — E1129 Type 2 diabetes mellitus with other diabetic kidney complication: Secondary | ICD-10-CM | POA: Diagnosis not present

## 2016-05-06 DIAGNOSIS — D509 Iron deficiency anemia, unspecified: Secondary | ICD-10-CM | POA: Diagnosis not present

## 2016-05-06 DIAGNOSIS — R17 Unspecified jaundice: Secondary | ICD-10-CM | POA: Diagnosis not present

## 2016-05-07 DIAGNOSIS — R17 Unspecified jaundice: Secondary | ICD-10-CM | POA: Diagnosis not present

## 2016-05-07 DIAGNOSIS — N186 End stage renal disease: Secondary | ICD-10-CM | POA: Diagnosis not present

## 2016-05-07 DIAGNOSIS — Z79899 Other long term (current) drug therapy: Secondary | ICD-10-CM | POA: Diagnosis not present

## 2016-05-07 DIAGNOSIS — E1129 Type 2 diabetes mellitus with other diabetic kidney complication: Secondary | ICD-10-CM | POA: Diagnosis not present

## 2016-05-07 DIAGNOSIS — E440 Moderate protein-calorie malnutrition: Secondary | ICD-10-CM | POA: Diagnosis not present

## 2016-05-07 DIAGNOSIS — D509 Iron deficiency anemia, unspecified: Secondary | ICD-10-CM | POA: Diagnosis not present

## 2016-05-08 DIAGNOSIS — N186 End stage renal disease: Secondary | ICD-10-CM | POA: Diagnosis not present

## 2016-05-08 DIAGNOSIS — D509 Iron deficiency anemia, unspecified: Secondary | ICD-10-CM | POA: Diagnosis not present

## 2016-05-08 DIAGNOSIS — E1129 Type 2 diabetes mellitus with other diabetic kidney complication: Secondary | ICD-10-CM | POA: Diagnosis not present

## 2016-05-08 DIAGNOSIS — R17 Unspecified jaundice: Secondary | ICD-10-CM | POA: Diagnosis not present

## 2016-05-08 DIAGNOSIS — E440 Moderate protein-calorie malnutrition: Secondary | ICD-10-CM | POA: Diagnosis not present

## 2016-05-08 DIAGNOSIS — Z79899 Other long term (current) drug therapy: Secondary | ICD-10-CM | POA: Diagnosis not present

## 2016-05-08 DIAGNOSIS — E784 Other hyperlipidemia: Secondary | ICD-10-CM | POA: Diagnosis not present

## 2016-05-09 DIAGNOSIS — E440 Moderate protein-calorie malnutrition: Secondary | ICD-10-CM | POA: Diagnosis not present

## 2016-05-09 DIAGNOSIS — Z79899 Other long term (current) drug therapy: Secondary | ICD-10-CM | POA: Diagnosis not present

## 2016-05-09 DIAGNOSIS — N186 End stage renal disease: Secondary | ICD-10-CM | POA: Diagnosis not present

## 2016-05-09 DIAGNOSIS — E1129 Type 2 diabetes mellitus with other diabetic kidney complication: Secondary | ICD-10-CM | POA: Diagnosis not present

## 2016-05-09 DIAGNOSIS — R17 Unspecified jaundice: Secondary | ICD-10-CM | POA: Diagnosis not present

## 2016-05-09 DIAGNOSIS — D509 Iron deficiency anemia, unspecified: Secondary | ICD-10-CM | POA: Diagnosis not present

## 2016-05-10 DIAGNOSIS — E1129 Type 2 diabetes mellitus with other diabetic kidney complication: Secondary | ICD-10-CM | POA: Diagnosis not present

## 2016-05-10 DIAGNOSIS — E440 Moderate protein-calorie malnutrition: Secondary | ICD-10-CM | POA: Diagnosis not present

## 2016-05-10 DIAGNOSIS — Z79899 Other long term (current) drug therapy: Secondary | ICD-10-CM | POA: Diagnosis not present

## 2016-05-10 DIAGNOSIS — N186 End stage renal disease: Secondary | ICD-10-CM | POA: Diagnosis not present

## 2016-05-10 DIAGNOSIS — R17 Unspecified jaundice: Secondary | ICD-10-CM | POA: Diagnosis not present

## 2016-05-10 DIAGNOSIS — D509 Iron deficiency anemia, unspecified: Secondary | ICD-10-CM | POA: Diagnosis not present

## 2016-05-11 DIAGNOSIS — R17 Unspecified jaundice: Secondary | ICD-10-CM | POA: Diagnosis not present

## 2016-05-11 DIAGNOSIS — Z79899 Other long term (current) drug therapy: Secondary | ICD-10-CM | POA: Diagnosis not present

## 2016-05-11 DIAGNOSIS — E440 Moderate protein-calorie malnutrition: Secondary | ICD-10-CM | POA: Diagnosis not present

## 2016-05-11 DIAGNOSIS — D509 Iron deficiency anemia, unspecified: Secondary | ICD-10-CM | POA: Diagnosis not present

## 2016-05-11 DIAGNOSIS — E1129 Type 2 diabetes mellitus with other diabetic kidney complication: Secondary | ICD-10-CM | POA: Diagnosis not present

## 2016-05-11 DIAGNOSIS — N186 End stage renal disease: Secondary | ICD-10-CM | POA: Diagnosis not present

## 2016-05-12 DIAGNOSIS — E440 Moderate protein-calorie malnutrition: Secondary | ICD-10-CM | POA: Diagnosis not present

## 2016-05-12 DIAGNOSIS — Z79899 Other long term (current) drug therapy: Secondary | ICD-10-CM | POA: Diagnosis not present

## 2016-05-12 DIAGNOSIS — N186 End stage renal disease: Secondary | ICD-10-CM | POA: Diagnosis not present

## 2016-05-12 DIAGNOSIS — R17 Unspecified jaundice: Secondary | ICD-10-CM | POA: Diagnosis not present

## 2016-05-12 DIAGNOSIS — D509 Iron deficiency anemia, unspecified: Secondary | ICD-10-CM | POA: Diagnosis not present

## 2016-05-12 DIAGNOSIS — E1129 Type 2 diabetes mellitus with other diabetic kidney complication: Secondary | ICD-10-CM | POA: Diagnosis not present

## 2016-05-13 DIAGNOSIS — E440 Moderate protein-calorie malnutrition: Secondary | ICD-10-CM | POA: Diagnosis not present

## 2016-05-13 DIAGNOSIS — D509 Iron deficiency anemia, unspecified: Secondary | ICD-10-CM | POA: Diagnosis not present

## 2016-05-13 DIAGNOSIS — R17 Unspecified jaundice: Secondary | ICD-10-CM | POA: Diagnosis not present

## 2016-05-13 DIAGNOSIS — Z79899 Other long term (current) drug therapy: Secondary | ICD-10-CM | POA: Diagnosis not present

## 2016-05-13 DIAGNOSIS — N186 End stage renal disease: Secondary | ICD-10-CM | POA: Diagnosis not present

## 2016-05-13 DIAGNOSIS — E1129 Type 2 diabetes mellitus with other diabetic kidney complication: Secondary | ICD-10-CM | POA: Diagnosis not present

## 2016-05-14 DIAGNOSIS — E1129 Type 2 diabetes mellitus with other diabetic kidney complication: Secondary | ICD-10-CM | POA: Diagnosis not present

## 2016-05-14 DIAGNOSIS — R17 Unspecified jaundice: Secondary | ICD-10-CM | POA: Diagnosis not present

## 2016-05-14 DIAGNOSIS — Z79899 Other long term (current) drug therapy: Secondary | ICD-10-CM | POA: Diagnosis not present

## 2016-05-14 DIAGNOSIS — D509 Iron deficiency anemia, unspecified: Secondary | ICD-10-CM | POA: Diagnosis not present

## 2016-05-14 DIAGNOSIS — N186 End stage renal disease: Secondary | ICD-10-CM | POA: Diagnosis not present

## 2016-05-14 DIAGNOSIS — E440 Moderate protein-calorie malnutrition: Secondary | ICD-10-CM | POA: Diagnosis not present

## 2016-05-15 DIAGNOSIS — D509 Iron deficiency anemia, unspecified: Secondary | ICD-10-CM | POA: Diagnosis not present

## 2016-05-15 DIAGNOSIS — Z79899 Other long term (current) drug therapy: Secondary | ICD-10-CM | POA: Diagnosis not present

## 2016-05-15 DIAGNOSIS — R17 Unspecified jaundice: Secondary | ICD-10-CM | POA: Diagnosis not present

## 2016-05-15 DIAGNOSIS — N186 End stage renal disease: Secondary | ICD-10-CM | POA: Diagnosis not present

## 2016-05-15 DIAGNOSIS — E440 Moderate protein-calorie malnutrition: Secondary | ICD-10-CM | POA: Diagnosis not present

## 2016-05-15 DIAGNOSIS — E1129 Type 2 diabetes mellitus with other diabetic kidney complication: Secondary | ICD-10-CM | POA: Diagnosis not present

## 2016-05-16 ENCOUNTER — Encounter (HOSPITAL_COMMUNITY): Payer: Self-pay | Admitting: *Deleted

## 2016-05-16 ENCOUNTER — Emergency Department (HOSPITAL_COMMUNITY): Payer: Medicare Other

## 2016-05-16 ENCOUNTER — Inpatient Hospital Stay (HOSPITAL_COMMUNITY)
Admission: EM | Admit: 2016-05-16 | Discharge: 2016-05-20 | DRG: 981 | Disposition: A | Discharge status: Home / Self Care | Payer: Medicare Other | Attending: Internal Medicine | Admitting: Internal Medicine

## 2016-05-16 DIAGNOSIS — R5081 Fever presenting with conditions classified elsewhere: Secondary | ICD-10-CM

## 2016-05-16 DIAGNOSIS — E1151 Type 2 diabetes mellitus with diabetic peripheral angiopathy without gangrene: Secondary | ICD-10-CM | POA: Diagnosis present

## 2016-05-16 DIAGNOSIS — I248 Other forms of acute ischemic heart disease: Secondary | ICD-10-CM | POA: Diagnosis not present

## 2016-05-16 DIAGNOSIS — Z992 Dependence on renal dialysis: Secondary | ICD-10-CM

## 2016-05-16 DIAGNOSIS — Z79899 Other long term (current) drug therapy: Secondary | ICD-10-CM

## 2016-05-16 DIAGNOSIS — R0902 Hypoxemia: Secondary | ICD-10-CM | POA: Diagnosis not present

## 2016-05-16 DIAGNOSIS — Z87891 Personal history of nicotine dependence: Secondary | ICD-10-CM

## 2016-05-16 DIAGNOSIS — N2581 Secondary hyperparathyroidism of renal origin: Secondary | ICD-10-CM | POA: Diagnosis not present

## 2016-05-16 DIAGNOSIS — I252 Old myocardial infarction: Secondary | ICD-10-CM

## 2016-05-16 DIAGNOSIS — N186 End stage renal disease: Secondary | ICD-10-CM | POA: Diagnosis not present

## 2016-05-16 DIAGNOSIS — Z9115 Patient's noncompliance with renal dialysis: Secondary | ICD-10-CM

## 2016-05-16 DIAGNOSIS — Z951 Presence of aortocoronary bypass graft: Secondary | ICD-10-CM | POA: Diagnosis not present

## 2016-05-16 DIAGNOSIS — J45909 Unspecified asthma, uncomplicated: Secondary | ICD-10-CM | POA: Diagnosis present

## 2016-05-16 DIAGNOSIS — I272 Pulmonary hypertension, unspecified: Secondary | ICD-10-CM | POA: Diagnosis present

## 2016-05-16 DIAGNOSIS — Z7982 Long term (current) use of aspirin: Secondary | ICD-10-CM

## 2016-05-16 DIAGNOSIS — I120 Hypertensive chronic kidney disease with stage 5 chronic kidney disease or end stage renal disease: Secondary | ICD-10-CM | POA: Diagnosis present

## 2016-05-16 DIAGNOSIS — J9611 Chronic respiratory failure with hypoxia: Secondary | ICD-10-CM | POA: Diagnosis present

## 2016-05-16 DIAGNOSIS — R079 Chest pain, unspecified: Secondary | ICD-10-CM

## 2016-05-16 DIAGNOSIS — H353290 Exudative age-related macular degeneration, unspecified eye, stage unspecified: Secondary | ICD-10-CM | POA: Diagnosis present

## 2016-05-16 DIAGNOSIS — E039 Hypothyroidism, unspecified: Secondary | ICD-10-CM | POA: Diagnosis present

## 2016-05-16 DIAGNOSIS — E8770 Fluid overload, unspecified: Secondary | ICD-10-CM | POA: Diagnosis not present

## 2016-05-16 DIAGNOSIS — IMO0002 Reserved for concepts with insufficient information to code with codable children: Secondary | ICD-10-CM | POA: Diagnosis present

## 2016-05-16 DIAGNOSIS — I2510 Atherosclerotic heart disease of native coronary artery without angina pectoris: Secondary | ICD-10-CM | POA: Diagnosis present

## 2016-05-16 DIAGNOSIS — E8779 Other fluid overload: Secondary | ICD-10-CM

## 2016-05-16 DIAGNOSIS — D631 Anemia in chronic kidney disease: Secondary | ICD-10-CM | POA: Diagnosis not present

## 2016-05-16 DIAGNOSIS — R0602 Shortness of breath: Secondary | ICD-10-CM

## 2016-05-16 DIAGNOSIS — J810 Acute pulmonary edema: Secondary | ICD-10-CM | POA: Diagnosis not present

## 2016-05-16 DIAGNOSIS — R509 Fever, unspecified: Secondary | ICD-10-CM | POA: Diagnosis present

## 2016-05-16 DIAGNOSIS — Z7902 Long term (current) use of antithrombotics/antiplatelets: Secondary | ICD-10-CM

## 2016-05-16 DIAGNOSIS — Z9114 Patient's other noncompliance with medication regimen: Secondary | ICD-10-CM

## 2016-05-16 DIAGNOSIS — R591 Generalized enlarged lymph nodes: Secondary | ICD-10-CM | POA: Diagnosis present

## 2016-05-16 DIAGNOSIS — R109 Unspecified abdominal pain: Secondary | ICD-10-CM | POA: Diagnosis not present

## 2016-05-16 DIAGNOSIS — E1165 Type 2 diabetes mellitus with hyperglycemia: Secondary | ICD-10-CM | POA: Diagnosis not present

## 2016-05-16 DIAGNOSIS — Z794 Long term (current) use of insulin: Secondary | ICD-10-CM

## 2016-05-16 DIAGNOSIS — E1129 Type 2 diabetes mellitus with other diabetic kidney complication: Secondary | ICD-10-CM | POA: Diagnosis not present

## 2016-05-16 DIAGNOSIS — N185 Chronic kidney disease, stage 5: Secondary | ICD-10-CM

## 2016-05-16 DIAGNOSIS — D709 Neutropenia, unspecified: Secondary | ICD-10-CM | POA: Insufficient documentation

## 2016-05-16 DIAGNOSIS — F329 Major depressive disorder, single episode, unspecified: Secondary | ICD-10-CM | POA: Diagnosis present

## 2016-05-16 DIAGNOSIS — R17 Unspecified jaundice: Secondary | ICD-10-CM | POA: Diagnosis not present

## 2016-05-16 DIAGNOSIS — D509 Iron deficiency anemia, unspecified: Secondary | ICD-10-CM | POA: Diagnosis not present

## 2016-05-16 DIAGNOSIS — I10 Essential (primary) hypertension: Secondary | ICD-10-CM | POA: Diagnosis not present

## 2016-05-16 DIAGNOSIS — J9621 Acute and chronic respiratory failure with hypoxia: Secondary | ICD-10-CM | POA: Diagnosis present

## 2016-05-16 DIAGNOSIS — F419 Anxiety disorder, unspecified: Secondary | ICD-10-CM | POA: Diagnosis present

## 2016-05-16 DIAGNOSIS — I25110 Atherosclerotic heart disease of native coronary artery with unstable angina pectoris: Secondary | ICD-10-CM | POA: Diagnosis not present

## 2016-05-16 DIAGNOSIS — Z86718 Personal history of other venous thrombosis and embolism: Secondary | ICD-10-CM

## 2016-05-16 DIAGNOSIS — E440 Moderate protein-calorie malnutrition: Secondary | ICD-10-CM | POA: Diagnosis not present

## 2016-05-16 DIAGNOSIS — E1122 Type 2 diabetes mellitus with diabetic chronic kidney disease: Secondary | ICD-10-CM | POA: Diagnosis present

## 2016-05-16 LAB — CBC WITH DIFFERENTIAL/PLATELET
BASOS PCT: 0 %
Basophils Absolute: 0 10*3/uL (ref 0.0–0.1)
EOS ABS: 0.2 10*3/uL (ref 0.0–0.7)
Eosinophils Relative: 2 %
HEMATOCRIT: 24.6 % — AB (ref 39.0–52.0)
HEMOGLOBIN: 8.1 g/dL — AB (ref 13.0–17.0)
LYMPHS ABS: 1 10*3/uL (ref 0.7–4.0)
Lymphocytes Relative: 10 %
MCH: 28.4 pg (ref 26.0–34.0)
MCHC: 32.9 g/dL (ref 30.0–36.0)
MCV: 86.3 fL (ref 78.0–100.0)
MONO ABS: 0.5 10*3/uL (ref 0.1–1.0)
MONOS PCT: 6 %
NEUTROS ABS: 7.9 10*3/uL — AB (ref 1.7–7.7)
NEUTROS PCT: 82 %
Platelets: 265 10*3/uL (ref 150–400)
RBC: 2.85 MIL/uL — ABNORMAL LOW (ref 4.22–5.81)
RDW: 15.7 % — AB (ref 11.5–15.5)
WBC: 9.5 10*3/uL (ref 4.0–10.5)

## 2016-05-16 LAB — I-STAT VENOUS BLOOD GAS, ED
ACID-BASE DEFICIT: 4 mmol/L — AB (ref 0.0–2.0)
Bicarbonate: 20.3 mmol/L (ref 20.0–28.0)
O2 SAT: 78 %
PCO2 VEN: 35.5 mmHg — AB (ref 44.0–60.0)
PH VEN: 7.368 (ref 7.250–7.430)
TCO2: 21 mmol/L (ref 0–100)
pO2, Ven: 46 mmHg — ABNORMAL HIGH (ref 32.0–45.0)

## 2016-05-16 LAB — COMPREHENSIVE METABOLIC PANEL
ALK PHOS: 79 U/L (ref 38–126)
ALT: 14 U/L — ABNORMAL LOW (ref 17–63)
ANION GAP: 17 — AB (ref 5–15)
AST: 16 U/L (ref 15–41)
Albumin: 2.8 g/dL — ABNORMAL LOW (ref 3.5–5.0)
BILIRUBIN TOTAL: 0.6 mg/dL (ref 0.3–1.2)
BUN: 99 mg/dL — ABNORMAL HIGH (ref 6–20)
CALCIUM: 6.1 mg/dL — AB (ref 8.9–10.3)
CO2: 18 mmol/L — ABNORMAL LOW (ref 22–32)
Chloride: 104 mmol/L (ref 101–111)
Creatinine, Ser: 12.03 mg/dL — ABNORMAL HIGH (ref 0.61–1.24)
GFR calc Af Amer: 5 mL/min — ABNORMAL LOW (ref >60)
GFR calc non Af Amer: 4 mL/min — ABNORMAL LOW (ref >60)
Glucose, Bld: 164 mg/dL — ABNORMAL HIGH (ref 65–99)
POTASSIUM: 5 mmol/L (ref 3.5–5.1)
Sodium: 139 mmol/L (ref 135–145)
TOTAL PROTEIN: 5.6 g/dL — AB (ref 6.5–8.1)

## 2016-05-16 LAB — LACTIC ACID, PLASMA
LACTIC ACID, VENOUS: 0.6 mmol/L (ref 0.5–1.9)
Lactic Acid, Venous: 0.6 mmol/L (ref 0.5–1.9)

## 2016-05-16 LAB — PROCALCITONIN: PROCALCITONIN: 0.39 ng/mL

## 2016-05-16 LAB — PROTIME-INR
INR: 1.25
PROTHROMBIN TIME: 15.8 s — AB (ref 11.4–15.2)

## 2016-05-16 LAB — PHOSPHORUS: Phosphorus: 10.2 mg/dL — ABNORMAL HIGH (ref 2.5–4.6)

## 2016-05-16 LAB — I-STAT TROPONIN, ED: TROPONIN I, POC: 0.02 ng/mL (ref 0.00–0.08)

## 2016-05-16 LAB — TROPONIN I: Troponin I: 0.03 ng/mL (ref <0.03)

## 2016-05-16 LAB — APTT: APTT: 35 s (ref 24–36)

## 2016-05-16 LAB — BRAIN NATRIURETIC PEPTIDE: B Natriuretic Peptide: 662.2 pg/mL — ABNORMAL HIGH (ref 0.0–100.0)

## 2016-05-16 MED ORDER — ONDANSETRON HCL 4 MG/2ML IJ SOLN: 4.0000 mg | Freq: Four times a day (QID) | INTRAMUSCULAR | Status: DC | PRN

## 2016-05-16 MED ORDER — CALCIUM ACETATE (PHOS BINDER) 667 MG PO CAPS
1334.0000 mg | ORAL_CAPSULE | Freq: Three times a day (TID) | ORAL | Status: DC
  Administered 2016-05-17 – 2016-05-20 (×5): 1334 mg via ORAL
  Filled 2016-05-16 (×6): qty 2

## 2016-05-16 MED ORDER — METOPROLOL TARTRATE 50 MG PO TABS
50.0000 mg | ORAL_TABLET | Freq: Two times a day (BID) | ORAL | Status: DC
  Administered 2016-05-17 – 2016-05-20 (×6): 50 mg via ORAL
  Filled 2016-05-16 (×6): qty 1

## 2016-05-16 MED ORDER — INSULIN ASPART 100 UNIT/ML ~~LOC~~ SOLN
0.0000 [IU] | Freq: Every day | SUBCUTANEOUS | Status: DC
  Administered 2016-05-17: 2 [IU] via SUBCUTANEOUS
  Administered 2016-05-18: 3 [IU] via SUBCUTANEOUS

## 2016-05-16 MED ORDER — HYDRALAZINE HCL 20 MG/ML IJ SOLN: 5.0000 mg | INTRAMUSCULAR | Status: DC | PRN

## 2016-05-16 MED ORDER — GABAPENTIN 400 MG PO CAPS
800.0000 mg | ORAL_CAPSULE | Freq: Two times a day (BID) | ORAL | Status: DC
  Administered 2016-05-17 – 2016-05-20 (×7): 800 mg via ORAL
  Filled 2016-05-16 (×7): qty 2

## 2016-05-16 MED ORDER — ACETAMINOPHEN 325 MG PO TABS
650.0000 mg | ORAL_TABLET | ORAL | Status: DC | PRN
  Administered 2016-05-17 – 2016-05-20 (×6): 650 mg via ORAL
  Filled 2016-05-16 (×6): qty 2

## 2016-05-16 MED ORDER — CYCLOBENZAPRINE HCL 10 MG PO TABS
10.0000 mg | ORAL_TABLET | Freq: Every day | ORAL | Status: DC
  Administered 2016-05-17 – 2016-05-19 (×3): 10 mg via ORAL
  Filled 2016-05-16 (×3): qty 1

## 2016-05-16 MED ORDER — ASPIRIN EC 81 MG PO TBEC
81.0000 mg | DELAYED_RELEASE_TABLET | Freq: Every day | ORAL | Status: DC
  Administered 2016-05-17 – 2016-05-20 (×3): 81 mg via ORAL
  Filled 2016-05-16 (×3): qty 1

## 2016-05-16 MED ORDER — METOCLOPRAMIDE HCL 5 MG PO TABS
5.0000 mg | ORAL_TABLET | Freq: Two times a day (BID) | ORAL | Status: DC
  Administered 2016-05-17 – 2016-05-20 (×6): 5 mg via ORAL
  Filled 2016-05-16 (×6): qty 1

## 2016-05-16 MED ORDER — DEXTROSE 5 % IV SOLN
1.0000 g | INTRAVENOUS | Status: DC
  Administered 2016-05-17 – 2016-05-19 (×4): 1 g via INTRAVENOUS
  Filled 2016-05-16 (×5): qty 10

## 2016-05-16 MED ORDER — CLONAZEPAM 0.5 MG PO TABS
1.0000 mg | ORAL_TABLET | Freq: Every evening | ORAL | Status: DC | PRN
  Administered 2016-05-19 (×2): 1 mg via ORAL
  Filled 2016-05-16 (×2): qty 2

## 2016-05-16 MED ORDER — IOPAMIDOL (ISOVUE-370) INJECTION 76%
INTRAVENOUS | Status: AC
Start: 2016-05-16 — End: 2016-05-16
  Administered 2016-05-16: 100 mL
  Filled 2016-05-16: qty 100

## 2016-05-16 MED ORDER — CLOPIDOGREL BISULFATE 75 MG PO TABS
75.0000 mg | ORAL_TABLET | Freq: Every day | ORAL | Status: DC
  Administered 2016-05-17 – 2016-05-20 (×3): 75 mg via ORAL
  Filled 2016-05-16 (×3): qty 1

## 2016-05-16 MED ORDER — ALBUTEROL SULFATE (2.5 MG/3ML) 0.083% IN NEBU: 2.5000 mg | INHALATION_SOLUTION | RESPIRATORY_TRACT | Status: DC | PRN

## 2016-05-16 MED ORDER — LEVOTHYROXINE SODIUM 50 MCG PO TABS
50.0000 ug | ORAL_TABLET | Freq: Every day | ORAL | Status: DC
  Administered 2016-05-17 – 2016-05-20 (×3): 50 ug via ORAL
  Filled 2016-05-16 (×3): qty 1

## 2016-05-16 MED ORDER — HYDROXYZINE HCL 25 MG PO TABS
25.0000 mg | ORAL_TABLET | Freq: Every day | ORAL | Status: DC
  Administered 2016-05-17 – 2016-05-19 (×3): 25 mg via ORAL
  Filled 2016-05-16 (×3): qty 1

## 2016-05-16 MED ORDER — INSULIN ASPART 100 UNIT/ML ~~LOC~~ SOLN
0.0000 [IU] | Freq: Three times a day (TID) | SUBCUTANEOUS | Status: DC
  Administered 2016-05-17: 5 [IU] via SUBCUTANEOUS
  Administered 2016-05-18: 2 [IU] via SUBCUTANEOUS
  Administered 2016-05-18: 7 [IU] via SUBCUTANEOUS
  Administered 2016-05-19 – 2016-05-20 (×2): 3 [IU] via SUBCUTANEOUS
  Administered 2016-05-20: 1 [IU] via SUBCUTANEOUS

## 2016-05-16 MED ORDER — AMLODIPINE BESYLATE 10 MG PO TABS: 10.0000 mg | ORAL_TABLET | Freq: Every day | ORAL | Status: DC

## 2016-05-16 MED ORDER — NITROGLYCERIN 0.4 MG SL SUBL
0.4000 mg | SUBLINGUAL_TABLET | SUBLINGUAL | Status: DC | PRN
  Administered 2016-05-16 (×2): 0.4 mg via SUBLINGUAL

## 2016-05-16 MED ORDER — HEPARIN SODIUM (PORCINE) 5000 UNIT/ML IJ SOLN: 5000.0000 [IU] | Freq: Three times a day (TID) | INTRAMUSCULAR | Status: AC

## 2016-05-16 MED ORDER — INSULIN GLARGINE 100 UNIT/ML ~~LOC~~ SOLN
20.0000 [IU] | Freq: Two times a day (BID) | SUBCUTANEOUS | Status: DC
  Filled 2016-05-16 (×2): qty 0.2

## 2016-05-16 MED ORDER — ATORVASTATIN CALCIUM 80 MG PO TABS
80.0000 mg | ORAL_TABLET | Freq: Every day | ORAL | Status: DC
  Administered 2016-05-17 – 2016-05-19 (×3): 80 mg via ORAL
  Filled 2016-05-16 (×3): qty 1

## 2016-05-16 MED ORDER — CALCITRIOL 0.25 MCG PO CAPS
0.2500 ug | ORAL_CAPSULE | ORAL | Status: DC
  Administered 2016-05-17: 0.25 ug via ORAL
  Filled 2016-05-16: qty 1

## 2016-05-16 MED ORDER — IPRATROPIUM-ALBUTEROL 0.5-2.5 (3) MG/3ML IN SOLN
3.0000 mL | Freq: Four times a day (QID) | RESPIRATORY_TRACT | Status: DC
  Administered 2016-05-17: 3 mL via RESPIRATORY_TRACT
  Filled 2016-05-16 (×3): qty 3

## 2016-05-16 MED ORDER — ZOLPIDEM TARTRATE 5 MG PO TABS
5.0000 mg | ORAL_TABLET | Freq: Every evening | ORAL | Status: DC | PRN
  Administered 2016-05-18 – 2016-05-19 (×2): 5 mg via ORAL
  Filled 2016-05-16 (×2): qty 1

## 2016-05-16 MED ORDER — NITROGLYCERIN 2 % TD OINT
1.0000 [in_us] | TOPICAL_OINTMENT | Freq: Once | TRANSDERMAL | Status: AC
  Administered 2016-05-16: 1 [in_us] via TOPICAL
  Filled 2016-05-16: qty 1

## 2016-05-16 MED ORDER — NITROGLYCERIN 0.4 MG SL SUBL
SUBLINGUAL_TABLET | SUBLINGUAL | Status: AC
  Administered 2016-05-16: 0.4 mg via SUBLINGUAL
  Filled 2016-05-16: qty 1

## 2016-05-16 MED ORDER — IPRATROPIUM-ALBUTEROL 18-103 MCG/ACT IN AERO: 2.0000 | INHALATION_SPRAY | Freq: Four times a day (QID) | RESPIRATORY_TRACT | Status: DC

## 2016-05-16 MED ORDER — MORPHINE SULFATE (PF) 4 MG/ML IV SOLN
2.0000 mg | INTRAVENOUS | Status: DC | PRN
  Administered 2016-05-19 – 2016-05-20 (×3): 2 mg via INTRAVENOUS
  Filled 2016-05-16 (×3): qty 1

## 2016-05-16 NOTE — ED Provider Notes (Signed)
[facility]-EMERGENCY DEPT Provider Note   CSN: [id number] Arrival date & time: 05/16/16  1727     History   Chief Complaint Chief Complaint  Patient presents with  . Shortness of Breath    HPI Russell [name] is a 46 y.o. male.  HPI 46 year old male with a history of hypertension, CAD with prior MI status post stenting, DVTs not currently on anticoagulation, ESRD with prior hemodialysis now currently on peritoneal dialysis for the past 2-3 months presents to the ED with 2 days of worsening shortness of breath and volume overload. Patient reports that shortness of breath exacerbated with movement and exertion. No alleviating factors. Reports that he has been performing peritoneal dialysis every day but is not taking off as much fluid as she normally does with hemodialysis. Patient reports that he noted his abdomen to be more distended and edematous than normal with also lower extremity edema. Endorses dry cough. Also noted intermittent pleuritic chest pain. No fevers, nausea/vomiting.  Denies any other physical complaints. Past Medical History:  Diagnosis Date  . Age-related macular degeneration, wet, both eyes (HCC)    "I'm getting Alia treatments" (12/30/2014)  . Anxiety   . Asthma   . 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 Daily headache   . Depression   . DVT (deep venous thrombosis), H/o 01/2014-on Xarelto 03/24/2014   LLE  . Hypertension   . Hypothyroidism   . Nephrotic syndrome 05/18/2014  . NSTEMI (non-ST elevated myocardial infarction) (HCC)   . Pneumonia 11/2013  . Renal insufficiency   . Secondary DM with DKA-AG=16, BIcarb Nl 03/24/2014  . Type 2 diabetes mellitus with diabetic nephropathy [facility])     Patient Active Problem List   Diagnosis Date Noted  . Acute on chronic  respiratory failure with hypoxia (HCC) 05/16/2016  . Fluid overload 05/16/2016  . Neutropenia with fever (HCC) 05/16/2016  . Fever 05/16/2016  . Hypotension 04/27/2016  . Peritoneal dialysis-associated peritonitis (HCC) 04/27/2016  . Anemia, chronic renal failure, stage 5 (HCC) 04/27/2016  . Peritonitis associated with peritoneal dialysis (HCC) 03/10/2016  . Abdominal pain 03/09/2016  . Hyperkalemia   . S/P CABG (coronary artery bypass graft) 09/28/2015  . Coronary artery disease involving native coronary artery of native heart with unstable angina pectoris (HCC)   . Hyperglycemia due to type 2 diabetes mellitus (HCC) 09/20/2015  . NSTEMI (non-ST elevated myocardial infarction) (HCC)   . Elevated troponin   . Angina pectoris (HCC) 07/20/2015  . Precordial pain 07/20/2015  . Hyperglycemia 07/20/2015  . DKA (diabetic ketoacidoses) (HCC) 02/13/2015  . ESRD on dialysis (HCC) 02/13/2015  . Nausea and vomiting 02/13/2015  . Nephrotic syndrome 01/03/2015  . Hypertensive urgency 12/30/2014  . Essential hypertension, benign   . Thyroid activity decreased   . Hx of gastroesophageal reflux (GERD) 09/30/2014  . Prolonged Q-T interval on ECG 09/30/2014  . Acute on chronic renal failure (HCC) 09/24/2014  . Diabetic neuropathy (HCC) 09/21/2014  . Uncontrolled diabetes mellitus type 2 with peripheral artery disease (HCC) 09/21/2014  . Morbid obesity (HCC) 09/21/2014  . Chest pain 04/22/2014  . Superficial thrombophlebitis 03/24/2014  . Hypothyroidism (acquired) 03/24/2014    Past Surgical History:  Procedure Laterality Date  . ANKLE FRACTURE SURGERY Right 1988  . AV FISTULA PLACEMENT Left 01/01/2015  Procedure: CREATION OF LEFT RADIAL CEPHALIC ARTERIOVENOUS (AV) FISTULA ;  Surgeon: Pryor Ochoa, MD;  Location: [facility];  Service: Vascular;  Laterality: Left;  . CARDIAC CATHETERIZATION N/A 09/22/2015   Procedure: Left Heart Cath and Coronary Angiography;  Surgeon: Iran Ouch, MD;   Location: [facility] INVASIVE CV LAB;  Service: Cardiovascular;  Laterality: N/A;  . CORONARY ARTERY BYPASS GRAFT N/A 09/28/2015   Procedure: CORONARY ARTERY BYPASS GRAFTING (CABG) x 3 UTILIZING LEFT MAMMARY ARTERY AND ENDOSCOPICALLY HARVESTED LEFT GREATER SAPHENOUS VEIN.;  Surgeon: Delight Ovens, MD;  Location: [facility] OR;  Service: Open Heart Surgery;  Laterality: N/A;  . ENDOVEIN HARVEST OF GREATER SAPHENOUS VEIN Left 09/28/2015   Procedure: ENDOVEIN HARVEST OF GREATER SAPHENOUS VEIN;  Surgeon: Delight Ovens, MD;  Location: [facility] OR;  Service: Open Heart Surgery;  Laterality: Left;  . ESOPHAGOGASTRODUODENOSCOPY N/A 03/29/2014   Procedure: ESOPHAGOGASTRODUODENOSCOPY (EGD);  Surgeon: Dorena Cookey, MD;  Location: [facility];  Service: Endoscopy;  Laterality: N/A;  . EYE SURGERY    . FRACTURE SURGERY    . INSERTION OF DIALYSIS CATHETER Right 01/05/2015   Procedure: INSERTION OF RIGHT INTERNAL JUGULAR DIALYSIS CATHETER;  Surgeon: Fransisco Hertz, MD;  Location[facility] OR;  Service: Vascular;  Laterality: Right;  . TEE WITHOUT CARDIOVERSION N/A 09/28/2015   Procedure: TRANSESOPHAGEAL ECHOCARDIOGRAM (TEE);  Surgeon: Delight Ovens, MD;  Location: [facility] OR;  Service: Open Heart Surgery;  Laterality: N/A;  . TONSILLECTOMY AND ADENOIDECTOMY  1970s       Home Medications    Prior to Admission medications   Medication Sig Start Date End Date Taking? Authorizing Provider  acetaminophen (TYLENOL) 500 MG tablet Take 1 tablet (500 mg total) by mouth every 6 (six) hours as needed for moderate pain. Patient taking differently: Take 1,000 mg by mouth every 6 (six) hours as needed (for pain).  09/29/14  Yes Albertine Grates, MD  albuterol (PROVENTIL HFA;VENTOLIN HFA) 108 (90 Base) MCG/ACT inhaler Inhale 1 puff into the lungs every 6 (six) hours as needed for wheezing or shortness of breath. 03/11/15  Yes [name], MD  albuterol (PROVENTIL) (2.5 MG/3ML) 0.083% nebulizer solution Take 2.5 mg by nebulization every 6 (six) hours as  needed for wheezing.   Yes [provider]  albuterol-ipratropium (COMBIVENT) 18-103 MCG/ACT inhaler Inhale 2 puffs into the lungs every 4 (four) hours. Patient taking differently: Inhale 2 puffs into the lungs every 6 (six) hours as needed for wheezing or shortness of breath.  09/03/14  Yes [name], MD  amLODipine (NORVASC) 10 MG tablet Take 1 tablet (10 mg total) by mouth daily. 04/17/16  Yes Shade Flood, MD  aspirin EC 81 MG EC tablet Take 1 tablet (81 mg total) by mouth daily. 10/06/15  Yes Barrett, Erin R, PA-C  atorvastatin (LIPITOR) 80 MG tablet Take 1 tablet (80 mg total) by mouth daily at 6 PM. 04/17/16  Yes Shade Flood, MD  calcitRIOL (ROCALTROL) 0.25 MCG capsule Take 1 capsule (0.25 mcg total) by mouth every Monday, Wednesday, and Friday with hemodialysis. 10/06/15  Yes Barrett, Erin R, PA-C  calcium acetate (PHOSLO) 667 MG capsule Take 2 capsules (1,334 mg total) by mouth 3 (three) times daily with meals. 01/08/15  Yes Rolly Salter, MD  clopidogrel (PLAVIX) 75 MG tablet Take 1 tablet (75 mg total) by mouth daily. 01/27/16  Yes Lewayne Bunting, MD  cyclobenzaprine (FLEXERIL) 10 MG tablet Take 1 tablet (10 mg total) by mouth at bedtime. 05/22/15  Yes [name],  MD  gabapentin (NEURONTIN) 100 MG capsule Take 2 capsules (200 mg total) by mouth 3 (three) times daily. Patient taking differently: Take 800 mg by mouth 2 (two) times daily.  05/22/15  Yes [name], MD  hydrOXYzine (ATARAX/VISTARIL) 25 MG tablet Take 25 mg by mouth at bedtime.    Yes [provider]  insulin aspart (NOVOLOG) 100 UNIT/ML injection Inject 5-10 Units into the skin 3 (three) times daily with meals. PER SLIDING SCALE   Yes [provider]  insulin glargine (LANTUS) 100 UNIT/ML injection Inject 0.3 mLs (30 Units total) into the skin 2 (two) times daily. Patient taking differently: Inject 40 Units into the skin 2 (two) times daily.  03/10/16  Yes Molt, Bethany, DO    insulin lispro (HUMALOG) 100 UNIT/ML injection Inject 0.15-0.3 mLs (15-30 Units total) into the skin every evening. Take one unit per 5 carbs depending on dinner. 11/29/15  Yes Carlus Pavlov, MD  levothyroxine (SYNTHROID, LEVOTHROID) 50 MCG tablet Take 1 tablet (50 mcg total) by mouth daily. 05/22/15  Yes [name], MD  metoCLOPramide (REGLAN) 5 MG tablet Take 1 tablet (5 mg total) by mouth every 8 (eight) hours as needed for nausea or vomiting. Patient taking differently: Take 5 mg by mouth 2 (two) times daily.  01/10/16  Yes Dong, Robin, DO  metoprolol (LOPRESSOR) 50 MG tablet Take 50 mg by mouth 2 (two) times daily.   Yes [provider]    Family History Family History  Problem Relation Age of Onset  . Obesity Mother        Patient states that family members have no other medical illnesses other than what I have described  . Kidney cancer Maternal Grandmother   . Cancer Father 50       AML  . Heart disease Unknown        No family history    Social History Social History  Substance Use Topics  . Smoking status: Former Smoker    Types: Cigarettes    Quit date: 03/17/2016  . Smokeless tobacco: Never Used  . Alcohol use 0.0 oz/week     Allergies   Ibuprofen; Nsaids; and Tape   Review of Systems Review of Systems All other systems are reviewed and are negative for acute change except as noted in the HPI   Physical Exam Updated Vital Signs BP (!) 165/97   Pulse (!) 107   Temp 100 F (37.8 C) (Oral)   Resp (!) 21   SpO2 80% on RA  Physical Exam  Constitutional: He is oriented to person, place, and time. He appears well-developed and well-nourished. No distress.  HENT:  Head: Normocephalic and atraumatic.  Nose: Nose normal.  Eyes: Conjunctivae and EOM are normal. Pupils are equal, round, and reactive to light. Right eye exhibits no discharge. Left eye exhibits no discharge. No scleral icterus.  Neck: Normal range of motion. Neck supple.   Cardiovascular: Normal rate and regular rhythm.  Exam reveals no gallop and no friction rub.   No murmur heard. Pulmonary/Chest: Breath sounds normal. No stridor. Tachypnea noted. He is in respiratory distress. He has no decreased breath sounds. He has no wheezes. He has no rhonchi. He has no rales.  Unable to take deep breaths.  Abdominal: Soft. He exhibits distension. There is no tenderness.  Abdominal wall edema  Musculoskeletal: He exhibits no edema or tenderness.  1+ bilateral lower extremity pitting edema  Neurological: He is alert and oriented to person, place, and time.  Skin:  Skin is warm and dry. No rash noted. He is not diaphoretic. No erythema.  Psychiatric: He has a normal mood and affect.  Vitals reviewed.    ED Treatments / Results  Labs (all labs ordered are listed, but only abnormal results are displayed) Labs Reviewed  CBC WITH DIFFERENTIAL/PLATELET - Abnormal; Notable for the following:       Result Value   RBC 2.85 (*)    Hemoglobin 8.1 (*)    HCT 24.6 (*)    RDW 15.7 (*)    Neutro Abs 7.9 (*)    All other components within normal limits  COMPREHENSIVE METABOLIC PANEL - Abnormal; Notable for the following:    CO2 18 (*)    Glucose, Bld 164 (*)    BUN 99 (*)    Creatinine, Ser 12.03 (*)    Calcium 6.1 (*)    Total Protein 5.6 (*)    Albumin 2.8 (*)    ALT 14 (*)    GFR calc non Af Amer 4 (*)    GFR calc Af Amer 5 (*)    Anion gap 17 (*)    All other components within normal limits  BRAIN NATRIURETIC PEPTIDE - Abnormal; Notable for the following:    B Natriuretic Peptide 662.2 (*)    All other components within normal limits  I-STAT VENOUS BLOOD GAS, ED - Abnormal; Notable for the following:    pCO2, Ven 35.5 (*)    pO2, Ven 46.0 (*)    Acid-base deficit 4.0 (*)    All other components within normal limits  URINE CULTURE  CULTURE, BLOOD (ROUTINE X 2)  CULTURE, BLOOD (ROUTINE X 2)  BLOOD GAS, ARTERIAL  HEMOGLOBIN A1C  LIPID PANEL  TROPONIN I   TROPONIN I  TROPONIN I  RAPID URINE DRUG SCREEN, HOSP PERFORMED  URINALYSIS, ROUTINE W REFLEX MICROSCOPIC  LACTIC ACID, PLASMA  LACTIC ACID, PLASMA  PROCALCITONIN  PROTIME-INR  APTT  I-STAT TROPOININ, ED    EKG  EKG Interpretation None       Radiology Ct Angio Chest Pe W Or Wo Contrast  Result Date: 05/16/2016 CLINICAL DATA:  Shortness of breath, centralized chest pain radiating to the left, starting yesterday. Hypoxia. History of DVT, hypertension, CABG. EXAM: CT ANGIOGRAPHY CHEST WITH CONTRAST TECHNIQUE: Multidetector CT imaging of the chest was performed using the standard protocol during bolus administration of intravenous contrast. Multiplanar CT image reconstructions and MIPs were obtained to evaluate the vascular anatomy. CONTRAST:  100 cc Isovue 370 COMPARISON:  CTA chest dated 07/19/2015. FINDINGS: Cardiovascular: The peripheral segmental and subsegmental pulmonary arteries cannot be definitively characterized due to patient breathing motion artifact. There is no central obstructing pulmonary embolism identified within the main or central lobar pulmonary arteries bilaterally. No obstructing pulmonary embolism identified within the central segmental pulmonary arteries. No aortic aneurysm or dissection. There is mild cardiomegaly. No pericardial effusion. Coronary artery calcifications noted. Patient is status post CABG. Mediastinum/Nodes: Enlarged lymph nodes noted within the right paratracheal mediastinum. Lymph node measuring 1.6 cm short axis dimension is seen on series 5, image 21. Additional enlarged lymph node is seen within the more inferior right paratracheal mediastinum measuring 1.9 cm short axis dimension on series 5, image 27. These lymph nodes measured 8 mm and 10 mm short axis dimension respectively on CT of 07/19/2015. Esophagus appears normal. Trachea and central bronchi are unremarkable. Lungs/Pleura: Scattered ground-glass opacities bilaterally, perihilar  predominant, most likely pulmonary edema. No pleural effusion or pneumothorax seen. Mild atelectasis at the left  lung base. Upper Abdomen: No acute abnormality. Musculoskeletal: No chest wall abnormality. No acute or significant osseous findings. Review of the MIP images confirms the above findings. IMPRESSION: 1. No pulmonary embolism seen, with study limitations detailed above. The most peripheral segmental and subsegmental pulmonary arteries cannot be definitively characterized due to patient breathing motion artifact. No central obstructing pulmonary embolism identified. 2. Scattered ground-glass opacities bilaterally, perihilar predominant, most likely pulmonary edema related to CHF/volume overload. Differential for ground-glass opacities also includes atypical pneumonias such as viral or fungal, interstitial pneumonias, chronic interstitial diseases, hypersensitivity pneumonitis, and respiratory bronchiolitis. 3. Cardiomegaly. 4. **An incidental finding of potential clinical significance has been found. Interval enlargement of 2 lymph nodes in the right paratracheal mediastinum, measurements and positions given above, largest measuring 1.9 cm short axis dimension, both pathologic by CT size criteria. Neoplastic lymphadenopathy cannot be excluded. Consider further characterization with nonemergent PET-CT. ** Electronically Signed   By: Bary Richard M.D.   On: 05/16/2016 18:44   Dg Chest Port 1 View  Result Date: 05/16/2016 CLINICAL DATA:  Shortness of breath.  Left-sided chest pain. EXAM: PORTABLE CHEST 1 VIEW COMPARISON:  March 09, 2016 FINDINGS: Cardiomegaly. The hila and mediastinum are normal. Mild interstitial prominence. No focal infiltrate. IMPRESSION: Cardiomegaly. Mild interstitial prominence suggesting pulmonary venous congestion. No focal infiltrate identified. Electronically Signed   By: Gerome Sam III M.D   On: 05/16/2016 18:45    Procedures Procedures (including critical care  time) CRITICAL CARE Performed by: Amadeo Garnet Jacinta Penalver Total critical care time: 35 minutes Critical care time was exclusive of separately billable procedures and treating other patients. Critical care was necessary to treat or prevent imminent or life-threatening deterioration. Critical care was time spent personally by me on the following activities: development of treatment plan with patient and/or surrogate as well as nursing, discussions with consultants, evaluation of patient's response to treatment, examination of patient, obtaining history from patient or surrogate, ordering and performing treatments and interventions, ordering and review of laboratory studies, ordering and review of radiographic studies, pulse oximetry and re-evaluation of patient's condition.  EMERGENCY DEPARTMENT [place] ACITES EXAM "Study: Limited Abdominal Ultrasound for Evaluation of Free Fluid"  INDICATIONS: Distention  PERFORMED BY: Myself IMAGES ARCHIVED?: Yes VIEWS USES: Right upper quad, Left upper quad, Right lower quad and Left lower quad INTERPRETATION: Free fluid not present     Medications Ordered in ED Medications  nitroGLYCERIN (NITROSTAT) SL tablet 0.4 mg (0.4 mg Sublingual Given 05/16/16 1845)  metoprolol tartrate (LOPRESSOR) tablet 50 mg (not administered)  amLODipine (NORVASC) tablet 10 mg (not administered)  atorvastatin (LIPITOR) tablet 80 mg (not administered)  insulin glargine (LANTUS) injection 20 Units (not administered)  clopidogrel (PLAVIX) tablet 75 mg (not administered)  clonazePAM (KLONOPIN) tablet 1 mg (not administered)  metoCLOPramide (REGLAN) tablet 5 mg (not administered)  aspirin EC tablet 81 mg (not administered)  calcitRIOL (ROCALTROL) capsule 0.25 mcg (not administered)  hydrOXYzine (ATARAX/VISTARIL) tablet 25 mg (not administered)  cyclobenzaprine (FLEXERIL) tablet 10 mg (not administered)  gabapentin (NEURONTIN) capsule 800 mg (not administered)  levothyroxine  (SYNTHROID, LEVOTHROID) tablet 50 mcg (not administered)  calcium acetate (PHOSLO) capsule 1,334 mg (not administered)  albuterol-ipratropium (COMBIVENT) inhaler 2 puff (not administered)  acetaminophen (TYLENOL) tablet 650 mg (not administered)  ondansetron (ZOFRAN) injection 4 mg (not administered)  heparin injection 5,000 Units (not administered)  zolpidem (AMBIEN) tablet 5 mg (not administered)  morphine 4 MG/ML injection 2 mg (not administered)  insulin aspart (novoLOG) injection 0-9 Units (not administered)  insulin aspart (  novoLOG) injection 0-5 Units (not administered)  cefTRIAXone (ROCEPHIN) 1 g in dextrose 5 % 50 mL IVPB (not administered)  nitroGLYCERIN (NITROGLYN) 2 % ointment 1 inch (1 inch Topical Given 05/16/16 1747)  iopamidol (ISOVUE-370) 76 % injection (100 mLs  Contrast Given 05/16/16 1825)     Initial Impression / Assessment and Plan / ED Course  I have reviewed the triage vital signs and the nursing notes.  Pertinent labs & imaging results that were available during my care of the patient were reviewed by me and considered in my medical decision making (see chart for details).     Consistent with volume overload with notable pulmonary edema. CTA was obtained given his prior history of DVTs with audible hypoxia, which is negative for PEs, pneumonia but did reveal the pulmonary edema. Elevated BNP. Other labs close the patient's baseline.  Bedside ultrasound without evidence of intraperitoneal retaining fluid. Patient without any abdominal pain that was suggest peritonitis.  Discussed case with nephrology who will arrange for hemodialysis. Discussed case with hospitalist for admission  Final Clinical Impressions(s) / ED Diagnoses   Final diagnoses:  SOB (shortness of breath)  Hypoxia  Acute pulmonary edema (HCC)  Other hypervolemia      Lenda Baratta, Amadeo GarnetPedro Eduardo, MD 05/16/16 2001

## 2016-05-16 NOTE — ED Triage Notes (Signed)
Pt arrives from home via [facility]. Pt states he began having SOB yesterday and only did a partial PD tx and had no tx today. Pt is supposed to do in home PD tx daily.

## 2016-05-16 NOTE — Consult Note (Signed)
Reason for Consult: Volume overloaded Referring Physician: Dr. Ivor Costa  Chief Complaint: Dyspnea  Assessment/Plan: 1 Volume overload - secondary to noncompliance to home regimen; only had been performing one single exchange a day with only about 364m net neg with the single exchange.  2 ESRD: completely noncompliant and will need to be converted to HD again; pt understands and Dr. GMoshe Ciprois very familiar with this pt. - Will need to be CLIP'd for an outpt center [place] - Fortunately has a beautiful lt Cimino - Will run at only Qb of 300 ml/min tonight for only 3 hrs with another tx tomorrow to decr risk of dialysis dysequilibrium as he's likely woefully underdialyzed. - Might as well have general surgery see him to remove the catheter. 3 Hypertension: Volume removal will certainly help; no new meds for now. 4. Anemia of ESRD: Check iron panel. 5. Metabolic Bone Disease:  - Will check a Phos, PTH 6. DM   Primary Nephrologist Dr. GMoshe CiproEDW Will need to be determined Access Lt Cimino  HPI: Russell STARLINGis a 46y.o. male ESRD with a left Cimino but has been on PD for about 2 mths recently admitted with culture negative peritonitis tx with Vanc + FTressie Ellisbut has admittedly been woefully noncompliant. Recently he has only performed a daily single exchanges with 2.5L infusion with only ~3042mnet neg with the single exchange. He is presenting with dyspnea for the past few days worsening over the past day and he has orthopnea, nonproductive cough found to be volume overloaded  By CXR and CT in the ED with sats in the 80's RA. Currently on 4L at time of interview. He has low grade temps but denies abdominal pain, productive cough, rigors or myalgias.   Past Medical History:  Diagnosis Date  . Age-related macular degeneration, wet, both eyes ([facility]   "I'm getting Alia treatments" (12/30/2014)  . Anxiety   . Asthma   . 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 Daily headache   . Depression   . DVT (deep venous thrombosis), H/o 01/2014-on Xarelto 03/24/2014   LLE  . Hypertension   . Hypothyroidism   . Nephrotic syndrome 05/18/2014  . NSTEMI (non-ST elevated myocardial infarction) ([facility]  . Pneumonia 11/2013  . Renal insufficiency   . Secondary DM with DKA-AG=16, BIcarb Nl 03/24/2014  . Type 2 diabetes mellitus with diabetic nephropathy (HCC)     Medications: I have reviewed the patient's current medications.   Allergies:  Allergies  Allergen Reactions  . Ibuprofen Other (See Comments)    MD told patient not to take due to kidney disease  . Nsaids Other (See Comments)    Told to avoid all nsaids due to kidney disease   . Tape Other (See Comments)    Welts result, if left for a long amount of time    Past Surgical History:  Procedure Laterality Date  . ANKLE FRACTURE SURGERY Right 1988  . AV FISTULA PLACEMENT Left 01/01/2015   Procedure: CREATION OF LEFT RADIAL CEPHALIC ARTERIOVENOUS (AV) FISTULA ;  Surgeon: JaMal MistyMD;  [facility] Service: [facility];  Laterality: Left;  . CARDIAC CATHETERIZATION N/A 09/22/2015   Procedure: Left Heart Cath and Coronary Angiography;  Surgeon: Wellington Hampshire, MD;  Location: [facility];  Service: Cardiovascular;  Laterality: N/A;  . CORONARY ARTERY BYPASS GRAFT N/A 09/28/2015   Procedure: CORONARY ARTERY BYPASS GRAFTING (CABG) x 3 UTILIZING LEFT MAMMARY ARTERY AND ENDOSCOPICALLY HARVESTED LEFT GREATER SAPHENOUS VEIN.;  Surgeon: [name], MD;  Location: [facility];  Service: Open Heart Surgery;  Laterality: N/A;  . ENDOVEIN HARVEST OF GREATER SAPHENOUS VEIN Left 09/28/2015   Procedure: ENDOVEIN HARVEST OF GREATER SAPHENOUS VEIN;  Surgeon: [name], MD;  Location: [facility];   Service: Open Heart Surgery;  Laterality: Left;  . ESOPHAGOGASTRODUODENOSCOPY N/A 03/29/2014   Procedure: ESOPHAGOGASTRODUODENOSCOPY (EGD);  Surgeon: Teena Irani, MD;  Location: [facility];  Service: Endoscopy;  Laterality: N/A;  . EYE SURGERY    . FRACTURE SURGERY    . INSERTION OF DIALYSIS CATHETER Right 01/05/2015   Procedure: INSERTION OF RIGHT INTERNAL JUGULAR DIALYSIS CATHETER;  Surgeon: Conrad Guide Rock, MD;  Location: [facility];  Service: Vascular;  Laterality: Right;  . TEE WITHOUT CARDIOVERSION N/A 09/28/2015   Procedure: TRANSESOPHAGEAL ECHOCARDIOGRAM (TEE);  Surgeon: [name], MD;  Location: [facility];  Service: Open Heart Surgery;  Laterality: N/A;  . TONSILLECTOMY AND ADENOIDECTOMY  1970s    Family History  Problem Relation Age of Onset  . Obesity Mother        Patient states that family members have no other medical illnesses other than what I have described  . Kidney cancer Maternal Grandmother   . Cancer Father 51       AML  . Heart disease Unknown        No family history    Social History:  reports that he quit smoking about 1 months ago. His smoking use included Cigarettes. He has never used smokeless tobacco. He reports that he drinks alcohol. He reports that he does not use drugs.  ROS Pertinent items are noted in HPI.  Blood pressure (!) 165/97, pulse (!) 107, temperature 100 F (37.8 C), temperature source Oral, resp. rate (!) 21, SpO2 100 %. General appearance: alert, cooperative and appears stated age Head: Normocephalic, without obvious abnormality, atraumatic Eyes: negative Neck: no adenopathy, no carotid bruit, supple, symmetrical, trachea midline and thyroid not enlarged, symmetric, no tenderness/mass/nodules Back: symmetric, no curvature. ROM normal. No CVA tenderness. Resp: rales bibasilar Chest wall: no tenderness Cardio: regular rate and rhythm, S1, S2 normal, no murmur, click, rub or gallop GI: soft, non-tender; bowel sounds normal; no masses,  no  organomegaly Extremities: edema 1+ Pulses: 2+ and symmetric Skin: Skin color, texture, turgor normal. No rashes or lesions Lymph nodes: Cervical, supraclavicular, and axillary nodes normal. Neurologic: Grossly normal  Left Cimino with great bruit and augments well; PD cath on left side of abdomen (no discharge, erythema)  Results for orders placed or performed during the hospital encounter of 05/16/16 (from the past 48 hour(s))  CBC with Differential/Platelet     Status: Abnormal   Collection Time: 05/16/16  5:40 PM  Result Value Ref Range   WBC 9.5 4.0 - 10.5 K/uL   RBC 2.85 (L) 4.22 - 5.81 MIL/uL   Hemoglobin 8.1 (L) 13.0 - 17.0 g/dL   HCT 24.6 (L) 39.0 - 52.0 %   MCV 86.3 78.0 - 100.0 fL   MCH 28.4 26.0 - 34.0 pg   MCHC 32.9 30.0 - 36.0 g/dL   RDW 15.7 (H) 11.5 - 15.5 %   Platelets 265 150 - 400 K/uL   Neutrophils Relative % 82 %  Neutro Abs 7.9 (H) 1.7 - 7.7 K/uL   Lymphocytes Relative 10 %   Lymphs Abs 1.0 0.7 - 4.0 K/uL   Monocytes Relative 6 %   Monocytes Absolute 0.5 0.1 - 1.0 K/uL   Eosinophils Relative 2 %   Eosinophils Absolute 0.2 0.0 - 0.7 K/uL   Basophils Relative 0 %   Basophils Absolute 0.0 0.0 - 0.1 K/uL  Comprehensive metabolic panel     Status: Abnormal   Collection Time: 05/16/16  5:40 PM  Result Value Ref Range   Sodium 139 135 - 145 mmol/L   Potassium 5.0 3.5 - 5.1 mmol/L   Chloride 104 101 - 111 mmol/L   CO2 18 (L) 22 - 32 mmol/L   Glucose, Bld 164 (H) 65 - 99 mg/dL   BUN 99 (H) 6 - 20 mg/dL   Creatinine, Ser 12.03 (H) 0.61 - 1.24 mg/dL   Calcium 6.1 (LL) 8.9 - 10.3 mg/dL    Comment: CRITICAL RESULT CALLED TO, READ BACK BY AND VERIFIED WITH: Raeford Razor RN [phone] [id number] GREEN R    Total Protein 5.6 (L) 6.5 - 8.1 g/dL   Albumin 2.8 (L) 3.5 - 5.0 g/dL   AST 16 15 - 41 U/L   ALT 14 (L) 17 - 63 U/L   Alkaline Phosphatase 79 38 - 126 U/L   Total Bilirubin 0.6 0.3 - 1.2 mg/dL   GFR calc non Af Amer 4 (L) >60 mL/min   GFR calc Af Amer 5 (L) >60  mL/min    Comment: (NOTE) The eGFR has been calculated using the CKD EPI equation. This calculation has not been validated in all clinical situations. eGFR's persistently <60 mL/min signify possible Chronic Kidney Disease.    Anion gap 17 (H) 5 - 15  Brain natriuretic peptide     Status: Abnormal   Collection Time: 05/16/16  5:40 PM  Result Value Ref Range   B Natriuretic Peptide 662.2 (H) 0.0 - 100.0 pg/mL  I-stat troponin, ED     Status: None   Collection Time: 05/16/16  5:44 PM  Result Value Ref Range   Troponin i, poc 0.02 0.00 - 0.08 ng/mL   Comment 3            Comment: Due to the release kinetics of cTnI, a negative result within the first hours of the onset of symptoms does not rule out myocardial infarction with certainty. If myocardial infarction is still suspected, repeat the test at appropriate intervals.   I-Stat venous blood gas, ED     Status: Abnormal   Collection Time: 05/16/16  6:06 PM  Result Value Ref Range   pH, Ven 7.368 7.250 - 7.430   pCO2, Ven 35.5 (L) 44.0 - 60.0 mmHg   pO2, Ven 46.0 (H) 32.0 - 45.0 mmHg   Bicarbonate 20.3 20.0 - 28.0 mmol/L   TCO2 21 0 - 100 mmol/L   O2 Saturation 78.0 %   Acid-base deficit 4.0 (H) 0.0 - 2.0 mmol/L   Patient temperature 100.0 F    Collection site RADIAL, ALLEN'S TEST ACCEPTABLE    Drawn by RT    Sample type MIXED VENOUS SAMPLE     Ct Angio Chest Pe W Or Wo Contrast  Result Date: 05/16/2016 CLINICAL DATA:  Shortness of breath, centralized chest pain radiating to the left, starting yesterday. Hypoxia. History of DVT, hypertension, CABG. EXAM: CT ANGIOGRAPHY CHEST WITH CONTRAST TECHNIQUE: Multidetector CT imaging of the chest was performed using the standard protocol during bolus administration  of intravenous contrast. Multiplanar CT image reconstructions and MIPs were obtained to evaluate the vascular anatomy. CONTRAST:  100 cc Isovue 370 COMPARISON:  CTA chest dated 07/19/2015. FINDINGS: Cardiovascular: The  peripheral segmental and subsegmental pulmonary arteries cannot be definitively characterized due to patient breathing motion artifact. There is no central obstructing pulmonary embolism identified within the main or central lobar pulmonary arteries bilaterally. No obstructing pulmonary embolism identified within the central segmental pulmonary arteries. No aortic aneurysm or dissection. There is mild cardiomegaly. No pericardial effusion. Coronary artery calcifications noted. Patient is status post CABG. Mediastinum/Nodes: Enlarged lymph nodes noted within the right paratracheal mediastinum. Lymph node measuring 1.6 cm short axis dimension is seen on series 5, image 21. Additional enlarged lymph node is seen within the more inferior right paratracheal mediastinum measuring 1.9 cm short axis dimension on series 5, image 27. These lymph nodes measured 8 mm and 10 mm short axis dimension respectively on CT of 07/19/2015. Esophagus appears normal. Trachea and central bronchi are unremarkable. Lungs/Pleura: Scattered ground-glass opacities bilaterally, perihilar predominant, most likely pulmonary edema. No pleural effusion or pneumothorax seen. Mild atelectasis at the left lung base. Upper Abdomen: No acute abnormality. Musculoskeletal: No chest wall abnormality. No acute or significant osseous findings. Review of the MIP images confirms the above findings. IMPRESSION: 1. No pulmonary embolism seen, with study limitations detailed above. The most peripheral segmental and subsegmental pulmonary arteries cannot be definitively characterized due to patient breathing motion artifact. No central obstructing pulmonary embolism identified. 2. Scattered ground-glass opacities bilaterally, perihilar predominant, most likely pulmonary edema related to CHF/volume overload. Differential for ground-glass opacities also includes atypical pneumonias such as viral or fungal, interstitial pneumonias, chronic interstitial diseases,  hypersensitivity pneumonitis, and respiratory bronchiolitis. 3. Cardiomegaly. 4. **An incidental finding of potential clinical significance has been found. Interval enlargement of 2 lymph nodes in the right paratracheal mediastinum, measurements and positions given above, largest measuring 1.9 cm short axis dimension, both pathologic by CT size criteria. Neoplastic lymphadenopathy cannot be excluded. Consider further characterization with nonemergent PET-CT. ** Electronically Signed   By: Franki Cabot M.D.   On: 05/16/2016 18:44   Dg Chest Port 1 View  Result Date: 05/16/2016 CLINICAL DATA:  Shortness of breath.  Left-sided chest pain. EXAM: PORTABLE CHEST 1 VIEW COMPARISON:  March 09, 2016 FINDINGS: Cardiomegaly. The hila and mediastinum are normal. Mild interstitial prominence. No focal infiltrate. IMPRESSION: Cardiomegaly. Mild interstitial prominence suggesting pulmonary venous congestion. No focal infiltrate identified. Electronically Signed   By: Dorise Bullion III M.D   On: 05/16/2016 18:45     Dwana Melena, MD 05/16/2016, 8:15 PM

## 2016-05-16 NOTE — ED Notes (Signed)
Pt unable to urinate at this time.  

## 2016-05-16 NOTE — H&P (Addendum)
History and Physical    Russell [name] VWU:[id number] DOB: Apr 27, 1970 DOA: 05/16/2016  Referring MD/NP/PA:   PCP: Shade Flood, MD   Patient coming from:  The patient is coming from home.  At baseline, pt is independent for most of ADL.   Chief Complaint: SOB, chest pain and fever  HPI: Russell [name] is a 46 y.o. male with medical history significant of ESRD on PD, IDDM, HTN, CAD s/p CABG, asthma, depression, anxiety, R leg DVT (completed 6 months of treatment) and hypothyroidism who presents with chest pain, shortness breath and fever  Patient states that he is doing daily PD daily at home, but dose not remove much fluid. He developed shortness of breath since yesterday, which has been progressively getting worse. He can speak in full sentence. He has mild dry cough. Patient also has chest pain. She is located in the substernal area, constant, 9 out of 10 in severity, nonradiating. No tenderness in the area. Patient reports fever, chills and body shaking in the past 2 days. Patient does not have runny nose or sore throat. Denies nausea, vomiting, abdominal pain, diarrhea. No symptoms of UTI. He still makes little urine. No unilateral weakness.  ED Course: pt was found to have  WBC 9.5, negative troponin, BNP 662, potassium 5.0, bicarbonate 18, creatinine 12.03, BUN 99, calcium 6.1 (corrected to 7.06), temperature 100, tachycardia, tachypnea, oxygen saturation 80% on room air, which improved to 99% on 4 L nasal cannula oxygen. Chest x-ray showed vascular congestion. Pt was treated with nitro patch and his chest pain improved to 2 out of 10 in severity currently. Patient is placed on telemetry bed for observation. Renal was consulted for dialysis.  CTA of chest showed  1. No pulmonary embolism seen, with study limitations detailed above. The most peripheral segmental and subsegmental pulmonary arteries cannot be definitively characterized due to patient breathing motion artifact. No central  obstructing pulmonary embolism identified. 2. Scattered ground-glass opacities bilaterally, perihilar predominant, most likely pulmonary edema related to CHF/volume overload. Differential for ground-glass opacities also includes atypical pneumonias such as viral or fungal, interstitial pneumonias, chronic interstitial diseases, hypersensitivity pneumonitis, and respiratory bronchiolitis. 3. Cardiomegaly. 4. An incidental finding of potential clinical significance has been found. Interval enlargement of 2 lymph nodes in the right paratracheal mediastinum, measurements and positions given above, largest measuring 1.9 cm short axis dimension, both pathologic by CT size criteria. Neoplastic lymphadenopathy cannot be excluded. Consider further characterization with nonemergent PET-CT.  Review of Systems:   General: has fevers, chills, has poor appetite, has fatigue HEENT: no blurry vision, hearing changes or sore throat Respiratory: has dyspnea, coughing, no wheezing CV: has chest pain, no palpitations GI: no nausea, vomiting, abdominal pain, diarrhea, constipation GU: no dysuria, burning on urination, increased urinary frequency, hematuria  Ext: has leg edema Neuro: no unilateral weakness, numbness, or tingling, no vision change or hearing loss Skin: no rash, no skin tear. MSK: No muscle spasm, no deformity, no limitation of range of movement in spin Heme: No easy bruising.  Travel history: No recent long distant travel.  Allergy:  Allergies  Allergen Reactions  . Ibuprofen Other (See Comments)    MD told patient not to take due to kidney disease  . Nsaids Other (See Comments)    Told to avoid all nsaids due to kidney disease   . Tape Other (See Comments)    Welts result, if left for a long amount of time    Past Medical History:  Diagnosis Date  .  Age-related macular degeneration, wet, both eyes (HCC)    "I'm getting Alia treatments" (12/30/2014)  . Anxiety   . Asthma   . CKD stage  3 due to type 2 diabetes mellitus (HCC)   . 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 Daily headache   . Depression   . DVT (deep venous thrombosis), H/o 01/2014-on Xarelto 03/24/2014   LLE  . Hypertension   . Hypothyroidism   . Nephrotic syndrome 05/18/2014  . NSTEMI (non-ST elevated myocardial infarction) (HCC)   . Pneumonia 11/2013  . Renal insufficiency   . Secondary DM with DKA-AG=16, BIcarb Nl 03/24/2014  . Type 2 diabetes mellitus with diabetic nephropathy [facility](HCC)     Past Surgical History:  Procedure Laterality Date  . ANKLE FRACTURE SURGERY Right 1988  . AV FISTULA PLACEMENT Left 01/01/2015   Procedure: CREATION OF LEFT RADIAL CEPHALIC ARTERIOVENOUS (AV) FISTULA ;  Surgeon: Pryor OchoaJames D Lawson, MD;  [facility];  Service: Vascular;  Laterality: Left;  . CARDIAC CATHETERIZATION N/A 09/22/2015   Procedure: Left Heart Cath and Coronary Angiography;  Surgeon: Iran OuchMuhammad A Arida, MD;  Location: [facility] INVASIVE CV LAB;  Service: Cardiovascular;  Laterality: N/A;  . CORONARY ARTERY BYPASS GRAFT N/A 09/28/2015   Procedure: CORONARY ARTERY BYPASS GRAFTING (CABG) x 3 UTILIZING LEFT MAMMARY ARTERY AND ENDOSCOPICALLY HARVESTED LEFT GREATER SAPHENOUS VEIN.;  Surgeon: Delight OvensEdward B Gerhardt, MD;  Location: [facility] OR;  Service: Open Heart Surgery;  Laterality: N/A;  . ENDOVEIN HARVEST OF GREATER SAPHENOUS VEIN Left 09/28/2015   Procedure: ENDOVEIN HARVEST OF GREATER SAPHENOUS VEIN;  Surgeon: Delight OvensEdward B Gerhardt, MD;  [facility];  Service: Open Heart Surgery;  Laterality: Left;  . ESOPHAGOGASTRODUODENOSCOPY N/A 03/29/2014   Procedure: ESOPHAGOGASTRODUODENOSCOPY (EGD);  Surgeon: Dorena CookeyJohn Hayes, MD;  [facility];  Service: Endoscopy;  Laterality: N/A;  . EYE SURGERY    . FRACTURE SURGERY    . INSERTION OF DIALYSIS CATHETER Right 01/05/2015   Procedure: INSERTION OF RIGHT  INTERNAL JUGULAR DIALYSIS CATHETER;  Surgeon: Fransisco HertzBrian L Chen, MD;  [facility];  Service: Vascular;  Laterality: Right;  . TEE WITHOUT CARDIOVERSION N/A 09/28/2015   Procedure: TRANSESOPHAGEAL ECHOCARDIOGRAM (TEE);  Surgeon: Delight OvensEdward B Gerhardt, MD;  [facility];  Service: Open Heart Surgery;  Laterality: N/A;  . TONSILLECTOMY AND ADENOIDECTOMY  1970s    Social History:  reports that he quit smoking about 1 months ago. His smoking use included Cigarettes. He has never used smokeless tobacco. He reports that he drinks alcohol. He reports that he does not use drugs.  Family History:  Family History  Problem Relation Age of Onset  . Obesity Mother        Patient states that family members have no other medical illnesses other than what I have described  . Kidney cancer Maternal Grandmother   . Cancer Father 50       AML  . Heart disease Unknown        No family history     Prior to Admission medications   Medication Sig Start Date End Date Taking? Authorizing Provider  acetaminophen (TYLENOL) 500 MG tablet Take 1 tablet (500 mg total) by mouth every 6 (six) hours as needed for moderate pain. Patient taking differently: Take 1,000 mg by mouth every 6 (six) hours as needed (for pain).  09/29/14   Albertine GratesXu, Fang, MD  albuterol (PROVENTIL HFA;VENTOLIN HFA) 108 (90 Base) MCG/ACT inhaler Inhale 1 puff into the lungs every 6 (six) hours as needed for wheezing or shortness of breath. 03/11/15   [name], MD  albuterol (PROVENTIL) (2.5 MG/3ML) 0.083% nebulizer solution Take 2.5 mg by nebulization every 6 (six) hours as needed for wheezing.    [provider]  albuterol-ipratropium (COMBIVENT) 18-103 MCG/ACT inhaler Inhale 2 puffs into the lungs every 4 (four) hours. Patient taking differently: Inhale 2 puffs into the lungs every 6 (six) hours as needed for wheezing or shortness of breath.  09/03/14   [name], MD  amLODipine (NORVASC) 10 MG tablet Take 1 tablet (10 mg total) by mouth  daily. 04/17/16   Shade Flood, MD  aspirin EC 81 MG EC tablet Take 1 tablet (81 mg total) by mouth daily. 10/06/15   Barrett, Erin R, PA-C  atorvastatin (LIPITOR) 80 MG tablet Take 1 tablet (80 mg total) by mouth daily at 6 PM. 04/17/16   Shade Flood, MD  calcitRIOL (ROCALTROL) 0.25 MCG capsule Take 1 capsule (0.25 mcg total) by mouth every Monday, Wednesday, and Friday with hemodialysis. 10/06/15   Barrett, Erin R, PA-C  calcium acetate (PHOSLO) 667 MG capsule Take 2 capsules (1,334 mg total) by mouth 3 (three) times daily with meals. 01/08/15   Rolly Salter, MD  ciprofloxacin (CIPRO) 500 MG tablet Take 1 tablet (500 mg total) by mouth daily with breakfast. 04/28/16   Rhetta Mura, MD  clonazePAM (KLONOPIN) 2 MG tablet Take 1 mg by mouth at bedtime.     [provider]  clopidogrel (PLAVIX) 75 MG tablet Take 1 tablet (75 mg total) by mouth daily. 01/27/16   Lewayne Bunting, MD  cyclobenzaprine (FLEXERIL) 10 MG tablet Take 1 tablet (10 mg total) by mouth at bedtime. 05/22/15   [name], MD  doxycycline (VIBRAMYCIN) 50 MG capsule Take 1 capsule (50 mg total) by mouth 2 (two) times daily. 04/28/16   Rhetta Mura, MD  gabapentin (NEURONTIN) 100 MG capsule Take 2 capsules (200 mg total) by mouth 3 (three) times daily. Patient taking differently: Take 800 mg by mouth 2 (two) times daily.  05/22/15   [name], MD  HYDROmorphone (DILAUDID) 2 MG tablet Take 1 tablet (2 mg total) by mouth every 8 (eight) hours as needed for severe pain. Patient not taking: Reported on 04/27/2016 05/22/15   [name], MD  hydrOXYzine (ATARAX/VISTARIL) 25 MG tablet Take 25 mg by mouth at bedtime.     [provider]  insulin aspart (NOVOLOG) 100 UNIT/ML injection Inject 5-10 Units into the skin 3 (three) times daily with meals. PER SLIDING SCALE    [provider]  insulin glargine (LANTUS) 100 UNIT/ML injection Inject 0.3 mLs (30 Units total) into the skin 2  (two) times daily. Patient taking differently: Inject 40 Units into the skin 2 (two) times daily.  03/10/16   Molt, Bethany, DO  insulin lispro (HUMALOG) 100 UNIT/ML injection Inject 0.15-0.3 mLs (15-30 Units total) into the skin every evening. Take one unit per 5 carbs depending on dinner. Patient not taking: Reported on 04/28/2016 11/29/15   Carlus Pavlov, MD  levothyroxine (SYNTHROID, LEVOTHROID) 50 MCG tablet Take 1 tablet (50 mcg total) by mouth daily. 05/22/15   [name], MD  metoCLOPramide (REGLAN) 5 MG tablet Take 1 tablet (5 mg total) by mouth every 8 (eight) hours as needed for nausea or vomiting. Patient taking differently: Take 5 mg by mouth 2 (two) times daily.  01/10/16   Dwana Melena, DO  metoprolol (LOPRESSOR) 50 MG tablet Take 50 mg by mouth 2 (two) times daily.    [provider]  polyethylene glycol (MIRALAX) packet Take 17 g by mouth daily. Patient not taking: Reported on 04/28/2016 07/20/15   Milagros Loll, MD  promethazine (PHENERGAN) 25 MG tablet Take 1 tablet (25 mg total) by mouth every 6 (six) hours as needed for nausea or vomiting. 01/08/16   Ward, Layla Maw, DO    Physical Exam: Vitals:   05/16/16 1815 05/16/16 1845 05/16/16 1915 05/16/16 1945  BP: (!) 157/86 (!) 143/89 (!) 144/77 (!) 165/97  Pulse: (!) 108 (!) 105 (!) 107 (!) 107  Resp: (!) 33 20 (!) 21 (!) 21  Temp:      TempSrc:      SpO2: 100% 99% 100% 100%   General: Not in acute distress HEENT:       Eyes: PERRL, EOMI, no scleral icterus.       ENT: No discharge from the ears and nose, no pharynx injection, no tonsillar enlargement.        Neck: Difficult to assess JVD due to morbid obesity, no bruit, no mass felt. Heme: No neck lymph node enlargement. Cardiac: S1/S2, RRR, No murmurs, No gallops or rubs. Respiratory:  No rales, wheezing, rhonchi or rubs. GI: Soft, nondistended, nontender, no rebound pain, no organomegaly, BS present. GU: No hematuria Ext:1+ pitting leg edema bilaterally.  2+DP/PT pulse bilaterally. Musculoskeletal: No joint deformities, No joint redness or warmth, no limitation of ROM in spin. Skin: No rashes.  Neuro: Alert, oriented X3, cranial nerves II-XII grossly intact, moves all extremities normally.  Psych: Patient is not psychotic, no suicidal or hemocidal ideation.  Labs on Admission: I have personally reviewed following labs and imaging studies  CBC:  Recent Labs Lab 05/16/16 1740  WBC 9.5  NEUTROABS 7.9*  HGB 8.1*  HCT 24.6*  MCV 86.3  PLT 265   Basic Metabolic Panel:  Recent Labs Lab 05/16/16 1740  NA 139  K 5.0  CL 104  CO2 18*  GLUCOSE 164*  BUN 99*  CREATININE 12.03*  CALCIUM 6.1*   GFR: CrCl cannot be calculated (Unknown ideal weight.). Liver Function Tests:  Recent Labs Lab 05/16/16 1740  AST 16  ALT 14*  ALKPHOS 79  BILITOT 0.6  PROT 5.6*  ALBUMIN 2.8*   No results for input(s): LIPASE, AMYLASE in the last 168 hours. No results for input(s): AMMONIA in the last 168 hours. Coagulation Profile: No results for input(s): INR, PROTIME in the last 168 hours. Cardiac Enzymes: No results for input(s): CKTOTAL, CKMB, CKMBINDEX, TROPONINI in the last 168 hours. BNP (last 3 results) No results for input(s): PROBNP in the last 8760 hours. HbA1C: No results for input(s): HGBA1C in the last 72 hours. CBG: No results for input(s): GLUCAP in the last 168 hours. Lipid Profile: No results for input(s): CHOL, HDL, LDLCALC, TRIG, CHOLHDL, LDLDIRECT in the last 72 hours. Thyroid Function Tests: No results for input(s): TSH, T4TOTAL, FREET4, T3FREE, THYROIDAB in the last 72 hours. Anemia Panel: No results for input(s): VITAMINB12, FOLATE, FERRITIN, TIBC, IRON, RETICCTPCT in the last 72 hours. Urine analysis:    Component Value Date/Time   COLORURINE YELLOW 03/09/2016 1000   APPEARANCEUR HAZY (A) 03/09/2016 1000   LABSPEC 1.017 03/09/2016 1000   PHURINE 5.0 03/09/2016 1000   GLUCOSEU >=500 (A) 03/09/2016 1000    HGBUR MODERATE (A) 03/09/2016 1000   BILIRUBINUR NEGATIVE 03/09/2016 1000   BILIRUBINUR  neg 05/26/2014 1956   KETONESUR NEGATIVE 03/09/2016 1000   PROTEINUR >=300 (A) 03/09/2016 1000   UROBILINOGEN 0.2 09/30/2014 1730   NITRITE NEGATIVE 03/09/2016 1000   LEUKOCYTESUR NEGATIVE 03/09/2016 1000   Sepsis Labs: @LABRCNTIP (procalcitonin:4,lacticidven:4) )No results found for this or any previous visit (from the past 240 hour(s)).   Radiological Exams on Admission: Ct Angio Chest Pe W Or Wo Contrast  Result Date: 05/16/2016 CLINICAL DATA:  Shortness of breath, centralized chest pain radiating to the left, starting yesterday. Hypoxia. History of DVT, hypertension, CABG. EXAM: CT ANGIOGRAPHY CHEST WITH CONTRAST TECHNIQUE: Multidetector CT imaging of the chest was performed using the standard protocol during bolus administration of intravenous contrast. Multiplanar CT image reconstructions and MIPs were obtained to evaluate the vascular anatomy. CONTRAST:  100 cc Isovue 370 COMPARISON:  CTA chest dated 07/19/2015. FINDINGS: Cardiovascular: The peripheral segmental and subsegmental pulmonary arteries cannot be definitively characterized due to patient breathing motion artifact. There is no central obstructing pulmonary embolism identified within the main or central lobar pulmonary arteries bilaterally. No obstructing pulmonary embolism identified within the central segmental pulmonary arteries. No aortic aneurysm or dissection. There is mild cardiomegaly. No pericardial effusion. Coronary artery calcifications noted. Patient is status post CABG. Mediastinum/Nodes: Enlarged lymph nodes noted within the right paratracheal mediastinum. Lymph node measuring 1.6 cm short axis dimension is seen on series 5, image 21. Additional enlarged lymph node is seen within the more inferior right paratracheal mediastinum measuring 1.9 cm short axis dimension on series 5, image 27. These lymph nodes measured 8 mm and 10 mm short  axis dimension respectively on CT of 07/19/2015. Esophagus appears normal. Trachea and central bronchi are unremarkable. Lungs/Pleura: Scattered ground-glass opacities bilaterally, perihilar predominant, most likely pulmonary edema. No pleural effusion or pneumothorax seen. Mild atelectasis at the left lung base. Upper Abdomen: No acute abnormality. Musculoskeletal: No chest wall abnormality. No acute or significant osseous findings. Review of the MIP images confirms the above findings. IMPRESSION: 1. No pulmonary embolism seen, with study limitations detailed above. The most peripheral segmental and subsegmental pulmonary arteries cannot be definitively characterized due to patient breathing motion artifact. No central obstructing pulmonary embolism identified. 2. Scattered ground-glass opacities bilaterally, perihilar predominant, most likely pulmonary edema related to CHF/volume overload. Differential for ground-glass opacities also includes atypical pneumonias such as viral or fungal, interstitial pneumonias, chronic interstitial diseases, hypersensitivity pneumonitis, and respiratory bronchiolitis. 3. Cardiomegaly. 4. **An incidental finding of potential clinical significance has been found. Interval enlargement of 2 lymph nodes in the right paratracheal mediastinum, measurements and positions given above, largest measuring 1.9 cm short axis dimension, both pathologic by CT size criteria. Neoplastic lymphadenopathy cannot be excluded. Consider further characterization with nonemergent PET-CT. ** Electronically Signed   By: Bary Richard M.D.   On: 05/16/2016 18:44   Dg Chest Port 1 View  Result Date: 05/16/2016 CLINICAL DATA:  Shortness of breath.  Left-sided chest pain. EXAM: PORTABLE CHEST 1 VIEW COMPARISON:  March 09, 2016 FINDINGS: Cardiomegaly. The hila and mediastinum are normal. Mild interstitial prominence. No focal infiltrate. IMPRESSION: Cardiomegaly. Mild interstitial prominence suggesting  pulmonary venous congestion. No focal infiltrate identified. Electronically Signed   By: Gerome Sam III M.D   On: 05/16/2016 18:45     EKG:  Not done in ED, will get stat EKG  Assessment/Plan Principal Problem:   Acute on chronic respiratory failure with hypoxia [facility]) Active Problems:   Hypothyroidism (acquired)   Chest pain   Uncontrolled diabetes mellitus type 2 with peripheral artery disease (HCC)  Essential hypertension, benign   ESRD on dialysis [facility])   Coronary artery disease involving native coronary artery of native heart with unstable angina pectoris (HCC)   S/P CABG (coronary artery bypass graft)   Fluid overload   Fever   Anemia due to end stage renal disease (HCC)   Acute on chronic respiratory failure with hypoxia (HCC): Most likely due to fluid overload. Chest x-ray showed vascular congestion. CT angiogram is a limited test, but has no central PE. Pt is doing PD at home, but does not remove much fluid from PD.  -will place on tele bed for obs -renal was consulted for dialysis  -continue albuterol nebs prn  ESRD on PD: -see above -continue Coumadin, PhosLo  Fever: Patient has a fever and chills for 2 days. Etiology is not clear. Patient has mild dry cough but no obvious infiltration on chest x-ray. Potential differential diagnosis includes bronchitis and urinary tract infection. Patient does not have abdominal pain, less likely to have peritonitis. -will start IV Rocephin empirically -follow up blood culture, urine culture and a urinalysis -will get Procalcitonin and trend lactic acid levels  Asthma: stable. No wheezing. -Combivent inhaler  -when necessary albuterol nebulizers  Chest and hx of CAD: s/p of CBAG: his chest has subsided from 9 to 2 out of 10 in severity after treated with nitroglycerin patch in ED. Likely due to demand ischemia secondary to fluid overload. Initial troponin negative. Pending EKG - cycle CE q6 x3 and repeat EKG in the am  -  Nitroglycerin, Morphine, and aspirin, plavix, lipitor, metoprolol - Risk factor stratification: will check FLP, UDS and A1C  - 2d echo  Hypothyroidism: Last TSH was 1.243 on 07/20/15 -Continue home Synthroid  DM-II: Last A1c 12.5 on 09/28/15, poorly controled. Patient is taking Lantus and NovoLog at home -will decrease Lantus dose from  30 to 20 bid -SSI -Check A1c  HTN: -Continue amlodipine, metoprolol -IV hydralazine when necessary  Anemia due to ESRD: Hemoglobin stable. Hemoglobin 8.6 on 04/28/16-->8.1 on admission -f/u by CBC  Enlarged lymph node: CTA of chest had an in incidental finding of interval enlargement of 2 lymph nodes in the right paratracheal mediastinum, measurements and positions given above, largest measuring 1.9 cm short axis dimension, both pathologic by CT size criteria. Neoplastic lymphadenopathy cannot be excluded per radiologist -may consider further characterization with nonemergent PET-CT after acute issues resolves -may give referral to oncology at discharge   DVT ppx: SQ Heparin    Code Status: Full code Family Communication: None at bed side.    Disposition Plan:  Anticipate discharge back to previous home environment Consults called: renal was consulted Admission status: Obs / tele  Date of Service 05/16/2016    Lorretta Harp [facility] Pager [phone]  If 7PM-7AM, please contact night-coverage [email or web address] Password [email or web address] 05/16/2016, 8:20 PM

## 2016-05-16 NOTE — ED Notes (Signed)
Nephrology at the bedside.

## 2016-05-17 ENCOUNTER — Observation Stay (HOSPITAL_BASED_OUTPATIENT_CLINIC_OR_DEPARTMENT_OTHER): Payer: Medicare Other

## 2016-05-17 DIAGNOSIS — Z9114 Patient's other noncompliance with medication regimen: Secondary | ICD-10-CM | POA: Diagnosis not present

## 2016-05-17 DIAGNOSIS — E1129 Type 2 diabetes mellitus with other diabetic kidney complication: Secondary | ICD-10-CM | POA: Diagnosis not present

## 2016-05-17 DIAGNOSIS — R509 Fever, unspecified: Secondary | ICD-10-CM | POA: Diagnosis not present

## 2016-05-17 DIAGNOSIS — R591 Generalized enlarged lymph nodes: Secondary | ICD-10-CM | POA: Diagnosis present

## 2016-05-17 DIAGNOSIS — J810 Acute pulmonary edema: Secondary | ICD-10-CM

## 2016-05-17 DIAGNOSIS — E1165 Type 2 diabetes mellitus with hyperglycemia: Secondary | ICD-10-CM | POA: Diagnosis not present

## 2016-05-17 DIAGNOSIS — R0602 Shortness of breath: Secondary | ICD-10-CM | POA: Diagnosis not present

## 2016-05-17 DIAGNOSIS — F329 Major depressive disorder, single episode, unspecified: Secondary | ICD-10-CM | POA: Diagnosis present

## 2016-05-17 DIAGNOSIS — D631 Anemia in chronic kidney disease: Secondary | ICD-10-CM | POA: Diagnosis not present

## 2016-05-17 DIAGNOSIS — N186 End stage renal disease: Secondary | ICD-10-CM | POA: Diagnosis not present

## 2016-05-17 DIAGNOSIS — Z951 Presence of aortocoronary bypass graft: Secondary | ICD-10-CM | POA: Diagnosis not present

## 2016-05-17 DIAGNOSIS — R072 Precordial pain: Secondary | ICD-10-CM | POA: Diagnosis not present

## 2016-05-17 DIAGNOSIS — J9621 Acute and chronic respiratory failure with hypoxia: Secondary | ICD-10-CM

## 2016-05-17 DIAGNOSIS — I252 Old myocardial infarction: Secondary | ICD-10-CM | POA: Diagnosis not present

## 2016-05-17 DIAGNOSIS — F419 Anxiety disorder, unspecified: Secondary | ICD-10-CM | POA: Diagnosis present

## 2016-05-17 DIAGNOSIS — I25110 Atherosclerotic heart disease of native coronary artery with unstable angina pectoris: Secondary | ICD-10-CM | POA: Diagnosis present

## 2016-05-17 DIAGNOSIS — I248 Other forms of acute ischemic heart disease: Secondary | ICD-10-CM | POA: Diagnosis present

## 2016-05-17 DIAGNOSIS — N2581 Secondary hyperparathyroidism of renal origin: Secondary | ICD-10-CM | POA: Diagnosis present

## 2016-05-17 DIAGNOSIS — I272 Pulmonary hypertension, unspecified: Secondary | ICD-10-CM | POA: Diagnosis present

## 2016-05-17 DIAGNOSIS — E119 Type 2 diabetes mellitus without complications: Secondary | ICD-10-CM | POA: Diagnosis not present

## 2016-05-17 DIAGNOSIS — E1122 Type 2 diabetes mellitus with diabetic chronic kidney disease: Secondary | ICD-10-CM | POA: Diagnosis present

## 2016-05-17 DIAGNOSIS — T85691A Other mechanical complication of intraperitoneal dialysis catheter, initial encounter: Secondary | ICD-10-CM | POA: Diagnosis not present

## 2016-05-17 DIAGNOSIS — I120 Hypertensive chronic kidney disease with stage 5 chronic kidney disease or end stage renal disease: Secondary | ICD-10-CM | POA: Diagnosis present

## 2016-05-17 DIAGNOSIS — E8770 Fluid overload, unspecified: Secondary | ICD-10-CM | POA: Diagnosis present

## 2016-05-17 DIAGNOSIS — E1151 Type 2 diabetes mellitus with diabetic peripheral angiopathy without gangrene: Secondary | ICD-10-CM

## 2016-05-17 DIAGNOSIS — E039 Hypothyroidism, unspecified: Secondary | ICD-10-CM | POA: Diagnosis present

## 2016-05-17 DIAGNOSIS — J45909 Unspecified asthma, uncomplicated: Secondary | ICD-10-CM | POA: Diagnosis present

## 2016-05-17 DIAGNOSIS — H353290 Exudative age-related macular degeneration, unspecified eye, stage unspecified: Secondary | ICD-10-CM | POA: Diagnosis present

## 2016-05-17 DIAGNOSIS — Z4902 Encounter for fitting and adjustment of peritoneal dialysis catheter: Secondary | ICD-10-CM | POA: Diagnosis not present

## 2016-05-17 DIAGNOSIS — Z86718 Personal history of other venous thrombosis and embolism: Secondary | ICD-10-CM | POA: Diagnosis not present

## 2016-05-17 DIAGNOSIS — Z992 Dependence on renal dialysis: Secondary | ICD-10-CM | POA: Diagnosis not present

## 2016-05-17 DIAGNOSIS — Z9115 Patient's noncompliance with renal dialysis: Secondary | ICD-10-CM | POA: Diagnosis not present

## 2016-05-17 LAB — RAPID URINE DRUG SCREEN, HOSP PERFORMED
Amphetamines: NOT DETECTED
BARBITURATES: NOT DETECTED
BENZODIAZEPINES: NOT DETECTED
Cocaine: NOT DETECTED
Opiates: NOT DETECTED
Tetrahydrocannabinol: NOT DETECTED

## 2016-05-17 LAB — CBC
HCT: 25.8 % — ABNORMAL LOW (ref 39.0–52.0)
HEMOGLOBIN: 8.1 g/dL — AB (ref 13.0–17.0)
MCH: 27.3 pg (ref 26.0–34.0)
MCHC: 31.4 g/dL (ref 30.0–36.0)
MCV: 86.9 fL (ref 78.0–100.0)
Platelets: 268 10*3/uL (ref 150–400)
RBC: 2.97 MIL/uL — AB (ref 4.22–5.81)
RDW: 15.8 % — ABNORMAL HIGH (ref 11.5–15.5)
WBC: 7.2 10*3/uL (ref 4.0–10.5)

## 2016-05-17 LAB — ECHOCARDIOGRAM COMPLETE
HEIGHTINCHES: 74 in
Weight: 4414.4 oz

## 2016-05-17 LAB — RENAL FUNCTION PANEL
ANION GAP: 15 (ref 5–15)
Albumin: 2.6 g/dL — ABNORMAL LOW (ref 3.5–5.0)
BUN: 59 mg/dL — ABNORMAL HIGH (ref 6–20)
CO2: 23 mmol/L (ref 22–32)
Calcium: 6.9 mg/dL — ABNORMAL LOW (ref 8.9–10.3)
Chloride: 99 mmol/L — ABNORMAL LOW (ref 101–111)
Creatinine, Ser: 8.64 mg/dL — ABNORMAL HIGH (ref 0.61–1.24)
GFR calc non Af Amer: 7 mL/min — ABNORMAL LOW (ref >60)
GFR, EST AFRICAN AMERICAN: 8 mL/min — AB (ref >60)
Glucose, Bld: 278 mg/dL — ABNORMAL HIGH (ref 65–99)
POTASSIUM: 4.7 mmol/L (ref 3.5–5.1)
Phosphorus: 8.7 mg/dL — ABNORMAL HIGH (ref 2.5–4.6)
Sodium: 137 mmol/L (ref 135–145)

## 2016-05-17 LAB — URINALYSIS, ROUTINE W REFLEX MICROSCOPIC
Bilirubin Urine: NEGATIVE
KETONES UR: NEGATIVE mg/dL
LEUKOCYTES UA: NEGATIVE
Nitrite: NEGATIVE
PH: 6 (ref 5.0–8.0)
Protein, ur: >=300 mg/dL — AB
SPECIFIC GRAVITY, URINE: 1.017 (ref 1.005–1.030)

## 2016-05-17 LAB — TROPONIN I
TROPONIN I: 0.04 ng/mL — AB (ref <0.03)
Troponin I: 0.04 ng/mL (ref <0.03)

## 2016-05-17 LAB — GLUCOSE, CAPILLARY
GLUCOSE-CAPILLARY: 175 mg/dL — AB (ref 65–99)
GLUCOSE-CAPILLARY: 228 mg/dL — AB (ref 65–99)
Glucose-Capillary: 144 mg/dL — ABNORMAL HIGH (ref 65–99)
Glucose-Capillary: 266 mg/dL — ABNORMAL HIGH (ref 65–99)

## 2016-05-17 LAB — LIPID PANEL
Cholesterol: 196 mg/dL (ref 0–200)
HDL: 32 mg/dL — AB (ref >40)
LDL CALC: 115 mg/dL — AB (ref 0–99)
Total CHOL/HDL Ratio: 6.1 RATIO
Triglycerides: 246 mg/dL — ABNORMAL HIGH (ref <150)
VLDL: 49 mg/dL — AB (ref 0–40)

## 2016-05-17 LAB — FERRITIN: Ferritin: 139 ng/mL (ref 24–336)

## 2016-05-17 LAB — IRON AND TIBC
IRON: 17 ug/dL — AB (ref 45–182)
Saturation Ratios: 8 % — ABNORMAL LOW (ref 17.9–39.5)
TIBC: 216 ug/dL — ABNORMAL LOW (ref 250–450)
UIBC: 199 ug/dL

## 2016-05-17 LAB — MRSA PCR SCREENING: MRSA BY PCR: NEGATIVE

## 2016-05-17 MED ORDER — SODIUM CHLORIDE 0.9 % IV SOLN: 100.0000 mL | INTRAVENOUS | Status: DC | PRN

## 2016-05-17 MED ORDER — LIDOCAINE-PRILOCAINE 2.5-2.5 % EX CREA
1.0000 "application " | TOPICAL_CREAM | CUTANEOUS | Status: DC | PRN
  Filled 2016-05-17: qty 5

## 2016-05-17 MED ORDER — INSULIN GLARGINE 100 UNIT/ML ~~LOC~~ SOLN
30.0000 [IU] | Freq: Every day | SUBCUTANEOUS | Status: DC
  Administered 2016-05-18: 30 [IU] via SUBCUTANEOUS
  Filled 2016-05-17: qty 0.3

## 2016-05-17 MED ORDER — ALTEPLASE 2 MG IJ SOLR: 2.0000 mg | Freq: Once | INTRAMUSCULAR | Status: DC | PRN

## 2016-05-17 MED ORDER — HEPARIN SODIUM (PORCINE) 1000 UNIT/ML DIALYSIS
2000.0000 [IU] | Freq: Once | INTRAMUSCULAR | Status: DC
  Filled 2016-05-17: qty 2

## 2016-05-17 MED ORDER — PERFLUTREN LIPID MICROSPHERE
1.0000 mL | INTRAVENOUS | Status: AC | PRN
  Administered 2016-05-17: 2 mL via INTRAVENOUS
  Filled 2016-05-17: qty 10

## 2016-05-17 MED ORDER — IPRATROPIUM-ALBUTEROL 0.5-2.5 (3) MG/3ML IN SOLN: 3.0000 mL | Freq: Two times a day (BID) | RESPIRATORY_TRACT | Status: DC

## 2016-05-17 MED ORDER — HEPARIN SODIUM (PORCINE) 1000 UNIT/ML DIALYSIS
1000.0000 [IU] | INTRAMUSCULAR | Status: DC | PRN
  Filled 2016-05-17: qty 1

## 2016-05-17 MED ORDER — INSULIN GLARGINE 100 UNIT/ML ~~LOC~~ SOLN
20.0000 [IU] | Freq: Every day | SUBCUTANEOUS | Status: DC
  Administered 2016-05-17: 20 [IU] via SUBCUTANEOUS
  Filled 2016-05-17: qty 0.2

## 2016-05-17 MED ORDER — ORAL CARE MOUTH RINSE
15.0000 mL | Freq: Two times a day (BID) | OROMUCOSAL | Status: DC
  Administered 2016-05-17 – 2016-05-18 (×2): 15 mL via OROMUCOSAL

## 2016-05-17 MED ORDER — LIDOCAINE HCL (PF) 1 % IJ SOLN: 5.0000 mL | INTRAMUSCULAR | Status: DC | PRN

## 2016-05-17 MED ORDER — PENTAFLUOROPROP-TETRAFLUOROETH EX AERO: 1.0000 "application " | INHALATION_SPRAY | CUTANEOUS | Status: DC | PRN

## 2016-05-17 MED ORDER — HEPARIN SODIUM (PORCINE) 1000 UNIT/ML DIALYSIS
4000.0000 [IU] | Freq: Once | INTRAMUSCULAR | Status: DC
  Filled 2016-05-17: qty 4

## 2016-05-17 MED ORDER — DARBEPOETIN ALFA 100 MCG/0.5ML IJ SOSY
PREFILLED_SYRINGE | INTRAMUSCULAR | Status: AC
  Filled 2016-05-17: qty 0.5

## 2016-05-17 MED ORDER — HEPARIN SODIUM (PORCINE) 1000 UNIT/ML DIALYSIS: 1000.0000 [IU] | INTRAMUSCULAR | Status: DC | PRN

## 2016-05-17 MED ORDER — DARBEPOETIN ALFA 100 MCG/0.5ML IJ SOSY
100.0000 ug | PREFILLED_SYRINGE | INTRAMUSCULAR | Status: DC
  Administered 2016-05-17: 100 ug via INTRAVENOUS
  Filled 2016-05-17: qty 0.5

## 2016-05-17 MED ORDER — NA FERRIC GLUC CPLX IN SUCROSE 12.5 MG/ML IV SOLN
125.0000 mg | Freq: Every day | INTRAVENOUS | Status: DC
  Administered 2016-05-17 – 2016-05-18 (×2): 125 mg via INTRAVENOUS
  Filled 2016-05-17 (×6): qty 10

## 2016-05-17 NOTE — Progress Notes (Signed)
New Admission Note:   Arrival Method: Bed Mental Orientation: Alert and oriented x4 Telemetry:3e box 19 Assessment: Completed Skin: Intact Pain:no chest pain Tubes: PD catheter and AV fistula present Safety Measures: Safety Fall Prevention Plan has been discussed  Admission:  [address] Orientation: Patient has been orientated to the room, unit and staff.  Family: none at bedside  Orders to be reviewed and implemented. Will continue to monitor the patient. Call light has been placed within reach and bed alarm has been activated.    MD Clyde LundborgNiu, Juliann ParesX informed about 0.03 troponin.  EKG done when patient arrived to the floor.  Will continue to monitor.   Bettey MareJasmina Matteson Blue, RN Phone: [phone]

## 2016-05-17 NOTE — Progress Notes (Signed)
PROGRESS NOTE                                                                                                                                                                                                             Patient Demographics:    Russell [name], is a 46 y.o. male, DOB - Mar 17, 1970, ONG:[id number]  Admit date - 05/16/2016   Admitting Physician Lorretta Harp, MD  Outpatient Primary MD for the patient is Shade Flood, MD  LOS - 0  Outpatient Specialists:Nephrology  Chief Complaint  Patient presents with  . Shortness of Breath       Brief Narrative   46 year old male with history of ESRD on peritoneal dialysis, insulin-dependent diabetes mellitus, hypertension, CAD history of CABG, asthma, depression, anxiety, right leg DVT (completed 6 months of anticoagulation) and hypothyroidism presented with shortness of breath with low-grade fever and chest discomfort. Patient found to be in acute on chronic respiratory failure secondary to fluid overload. This is likely in the setting of nonadherence to his peritoneal dialysis at home.   Subjective:   Reports no significant improvement in his dyspnea. Denies chest pain.   Assessment  & Plan :    Principal Problem:   Acute on chronic respiratory failure with hypoxia (HCC) Secondary to acute volume overload from failed outpatient peritoneal dialysis (noncompliance). CT angiogram of the chest negative for PE or infiltrate. Patient underwent hemodialysis on admission and removed 2 L. Another hemodialysis session today. Plan on switching to hemodialysis and have him CLIPed prior to discharge. Continue O2 via nasal cannula and telemetry monitoring. Monitor I/O.  Active Problems:     Chest pain Possibly due to volume overload. No EKG changes or elevated troponin. Check 2-D echo.    Uncontrolled diabetes mellitus type 2 with peripheral artery disease (HCC) Last A1c from  9/17 of 12.5. Poorly controlled likely due to adherence to medication. Resume home dose Lantus and sliding scale coverage. Check A1c.    ESRD on dialysis [facility]) Failed peritoneal dialysis due to non-compliance. No switching to hemodialysis. Needs to be CLIPed. Surgery consulted for removal of PD catheter. Continue calcitriol and PhosLo.    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 Check 2-D echo.  Fever Unexplained.  Follow cultures. On empiric Rocephin.  Anemia due to chronic kidney disease/iron deficiency anemia  Stable at baseline.  Enlarged lymph node on CT chest Incidental finding with enlarged lymph node in the right paratracheal mediastinum. Recommend outpatient PET-CT.     Essential hypertension, benign Resume home medications    Hypothyroidism (acquired) Continue Synthroid.    Code Status : Full code  Family Communication  : None at bedside  Disposition Plan  : Home once symptoms better and outpatient dialysis setup  Barriers For Discharge : Active symptoms  Consults  :  Nephrology [place] surgery  Procedures  : * CT angiogram of the chest 2-D echo  DVT Prophylaxis  :  Subcutaneous heparin  Lab Results  Component Value Date   PLT 268 05/17/2016    Antibiotics  :   Anti-infectives    Start     Dose/Rate Route Frequency Ordered Stop   05/16/16 2000  cefTRIAXone (ROCEPHIN) 1 g in dextrose 5 % 50 mL IVPB     1 g 100 mL/hr over 30 Minutes Intravenous Every 24 hours 05/16/16 1947          Objective:   Vitals:   05/17/16 0212 05/17/16 0644 05/17/16 0815 05/17/16 0838  BP: (!) 157/95 124/66 126/78   Pulse: (!) 120 88 86   Resp: (!) 22 18 18    Temp: 100 F (37.8 C) 98.4 F (36.9 C) 97.8 F (36.6 C)   TempSrc: Oral Oral Oral   SpO2: 97% 97% 95% 95%  Weight: 125.1 kg (275 lb 14.4 oz)       Height: 6\' 2"  (1.88 m)       Wt Readings from Last 3 Encounters:  05/17/16 125.1 kg (275 lb 14.4 oz)  04/28/16 125.8 kg (277 lb 5.4 oz)  04/27/16 123.4 kg (272 lb)     Intake/Output Summary (Last 24 hours) at 05/17/16 1425 Last data filed at 05/17/16 0924  Gross per 24 hour  Intake               70 ml  Output             2325 ml  Net            -2255 ml     Physical Exam  Gen: not in distress HEENT: , moist mucosa, supple neck Chest: Fine bibasilar crackles CVS: N S1&S2, no murmurs, rubs or gallop GI: soft, NT, ND, BS+, peritoneal dialysis catheter+ Musculoskeletal: warm, no edema CNS: Alert and oriented, nonfocal    Data Review:    CBC  Recent Labs Lab 05/16/16 1740 05/17/16 1141  WBC 9.5 7.2  HGB 8.1* 8.1*  HCT 24.6* 25.8*  PLT 265 268  MCV 86.3 86.9  MCH 28.4 27.3  MCHC 32.9 31.4  RDW 15.7* 15.8*  LYMPHSABS 1.0  --   MONOABS 0.5  --   EOSABS 0.2  --   BASOSABS 0.0  --     Chemistries   Recent Labs Lab 05/16/16 1740 05/17/16 1141  NA 139 137  K 5.0 4.7  CL 104 99*  CO2 18* 23  GLUCOSE 164* 278*  BUN 99* 59*  CREATININE 12.03* 8.64*  CALCIUM 6.1* 6.9*  AST 16  --   ALT 14*  --   ALKPHOS 79  --   BILITOT 0.6  --    ------------------------------------------------------------------------------------------------------------------  Recent Labs  05/17/16 0208  CHOL 196  HDL 32*  LDLCALC 115*  TRIG 246*  CHOLHDL 6.1    Lab Results  Component Value Date   HGBA1C 12.5 (H) 09/28/2015   ------------------------------------------------------------------------------------------------------------------ No results for input(s): TSH, T4TOTAL, T3FREE, THYROIDAB in the last 72 hours.  Invalid input(s): FREET3 ------------------------------------------------------------------------------------------------------------------  Recent Labs  05/17/16 0711  FERRITIN 139  TIBC 216*  IRON 17*    Coagulation profile  Recent Labs Lab  05/16/16 2000  INR 1.25    No results for input(s): DDIMER in the last 72 hours.  Cardiac Enzymes  Recent Labs Lab 05/16/16 2000 05/17/16 0208 05/17/16 0711  TROPONINI 0.03* 0.04* 0.04*   ------------------------------------------------------------------------------------------------------------------    Component Value Date/Time   BNP 662.2 (H) 05/16/2016 1740   BNP 41.7 05/05/2014 0904    Inpatient Medications  Scheduled Meds: . aspirin EC  81 mg Oral Daily  . atorvastatin  80 mg Oral q1800  . calcitRIOL  0.25 mcg Oral Q M,W,F-HD  . calcium acetate  1,334 mg Oral TID WC  . clopidogrel  75 mg Oral Daily  . cyclobenzaprine  10 mg Oral QHS  . darbepoetin (ARANESP) injection - DIALYSIS  100 mcg Intravenous Q Wed-HD  . gabapentin  800 mg Oral BID  . heparin  4,000 Units Dialysis Once in dialysis  . heparin  5,000 Units Subcutaneous Q8H  . hydrOXYzine  25 mg Oral QHS  . insulin aspart  0-5 Units Subcutaneous QHS  . insulin aspart  0-9 Units Subcutaneous TID WC  . insulin glargine  20 Units Subcutaneous Daily  . levothyroxine  50 mcg Oral QAC breakfast  . mouth rinse  15 mL Mouth Rinse BID  . metoCLOPramide  5 mg Oral BID  . metoprolol tartrate  50 mg Oral BID   Continuous Infusions: . sodium chloride    . sodium chloride    . cefTRIAXone (ROCEPHIN)  IV Stopped (05/17/16 0414)  . ferric gluconate (FERRLECIT/NULECIT) IV     PRN Meds:.sodium chloride, sodium chloride, acetaminophen, albuterol, alteplase, clonazePAM, heparin, hydrALAZINE, lidocaine (PF), lidocaine-prilocaine, morphine injection, nitroGLYCERIN, ondansetron (ZOFRAN) IV, pentafluoroprop-tetrafluoroeth, zolpidem  Micro Results Recent Results (from the past 240 hour(s))  MRSA PCR Screening     Status: None   Collection Time: 05/17/16  2:08 AM  Result Value Ref Range Status   MRSA by PCR NEGATIVE NEGATIVE Final    Comment:        The GeneXpert MRSA Assay (FDA approved for NASAL specimens only), is one  component of a comprehensive MRSA colonization surveillance program. It is not intended to diagnose MRSA infection nor to guide or monitor treatment for MRSA infections.     Radiology Reports Ct Abdomen Pelvis Wo Contrast  Result Date: 04/27/2016 CLINICAL DATA:  46 year old male with abdominal and pelvic pain with nausea for 3 days. EXAM: CT ABDOMEN AND PELVIS WITHOUT CONTRAST TECHNIQUE: Multidetector CT imaging of the abdomen and pelvis was performed following the standard protocol without IV contrast. COMPARISON:  None. FINDINGS: Please note that parenchymal abnormalities may be missed without intravenous contrast. Lower chest: No acute abnormality Hepatobiliary: The liver and gallbladder are unremarkable. There is no evidence of biliary dilatation. 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 There is no evidence of hydronephrosis or urinary calculi. The adrenal glands and bladder are unremarkable. Stomach/Bowel: A moderate amount of pneumoperitoneum throughout the mid and upper abdomen identified. A peritoneal dialysis catheter is noted. No bowel abnormalities are identified. No bowel wall thickening or bowel obstruction noted. There is no evidence of inflammatory changes around any bowel loops. Vascular/Lymphatic: Aortic atherosclerotic calcification noted without aneurysm. No enlarged lymph  nodes identified. Reproductive: Prostate is unremarkable. Other: No ascites or abscess. Musculoskeletal: No acute or significant osseous findings. IMPRESSION: Moderate amount of pneumoperitoneum within the mid and upper abdomen. No bowel abnormality or inflammation is identified and pneumoperitoneum is most likely related to air from the patient's peritoneal dialysis. Correlate clinically and consider follow-up as bowel perforation it is difficult to entirely exclude. Abdominal aortic atherosclerosis. Horseshoe kidney. Electronically Signed   By:  Harmon Pier M.D.   On: 04/27/2016 18:24   Dg Abd 1 View  Result Date: 04/28/2016 CLINICAL DATA:  Abdominal pain x2 days EXAM: ABDOMEN - 1 VIEW COMPARISON:  Same day CT FINDINGS: Peritoneal dialysis catheter traverses the left hemiabdomen with tip terminating in the pelvis. Visualization of both sides of bowel wall involving a segment of bowel in the left hemiabdomen would be consistent with pneumoperitoneum at noted on same day CT possibly related to the patient's peritoneal dialysis. No bowel obstruction is seen. Short term interval surveillance and follow-up radiographs or CT is recommended if there is clinical concern for bowel perforation. No radio-opaque calculi nor acute osseous abnormality. Median sternotomy sutures are present. IMPRESSION: Subtle changes of pneumoperitoneum possibly related to peritoneal dialysis. No bowel obstruction noted. No significant accumulation of free air is apparent radiographically. Short-term interval follow-up radiographs or repeat CT may help to assure stability. Electronically Signed   By: Tollie Eth M.D.   On: 04/28/2016 00:01   Ct Angio Chest Pe W Or Wo Contrast  Result Date: 05/16/2016 CLINICAL DATA:  Shortness of breath, centralized chest pain radiating to the left, starting yesterday. Hypoxia. History of DVT, hypertension, CABG. EXAM: CT ANGIOGRAPHY CHEST WITH CONTRAST TECHNIQUE: Multidetector CT imaging of the chest was performed using the standard protocol during bolus administration of intravenous contrast. Multiplanar CT image reconstructions and MIPs were obtained to evaluate the vascular anatomy. CONTRAST:  100 cc Isovue 370 COMPARISON:  CTA chest dated 07/19/2015. FINDINGS: Cardiovascular: The peripheral segmental and subsegmental pulmonary arteries cannot be definitively characterized due to patient breathing motion artifact. There is no central obstructing pulmonary embolism identified within the main or central lobar pulmonary arteries bilaterally. No  obstructing pulmonary embolism identified within the central segmental pulmonary arteries. No aortic aneurysm or dissection. There is mild cardiomegaly. No pericardial effusion. Coronary artery calcifications noted. Patient is status post CABG. Mediastinum/Nodes: Enlarged lymph nodes noted within the right paratracheal mediastinum. Lymph node measuring 1.6 cm short axis dimension is seen on series 5, image 21. Additional enlarged lymph node is seen within the more inferior right paratracheal mediastinum measuring 1.9 cm short axis dimension on series 5, image 27. These lymph nodes measured 8 mm and 10 mm short axis dimension respectively on CT of 07/19/2015. Esophagus appears normal. Trachea and central bronchi are unremarkable. Lungs/Pleura: Scattered ground-glass opacities bilaterally, perihilar predominant, most likely pulmonary edema. No pleural effusion or pneumothorax seen. Mild atelectasis at the left lung base. Upper Abdomen: No acute abnormality. Musculoskeletal: No chest wall abnormality. No acute or significant osseous findings. Review of the MIP images confirms the above findings. IMPRESSION: 1. No pulmonary embolism seen, with study limitations detailed above. The most peripheral segmental and subsegmental pulmonary arteries cannot be definitively characterized due to patient breathing motion artifact. No central obstructing pulmonary embolism identified. 2. Scattered ground-glass opacities bilaterally, perihilar predominant, most likely pulmonary edema related to CHF/volume overload. Differential for ground-glass opacities also includes atypical pneumonias such as viral or fungal, interstitial pneumonias, chronic interstitial diseases, hypersensitivity pneumonitis, and respiratory bronchiolitis. 3. Cardiomegaly. 4. **An incidental finding  of potential clinical significance has been found. Interval enlargement of 2 lymph nodes in the right paratracheal mediastinum, measurements and positions given  above, largest measuring 1.9 cm short axis dimension, both pathologic by CT size criteria. Neoplastic lymphadenopathy cannot be excluded. Consider further characterization with nonemergent PET-CT. ** Electronically Signed   By: Bary Richard M.D.   On: 05/16/2016 18:44   Dg Chest Port 1 View  Result Date: 05/16/2016 CLINICAL DATA:  Shortness of breath.  Left-sided chest pain. EXAM: PORTABLE CHEST 1 VIEW COMPARISON:  March 09, 2016 FINDINGS: Cardiomegaly. The hila and mediastinum are normal. Mild interstitial prominence. No focal infiltrate. IMPRESSION: Cardiomegaly. Mild interstitial prominence suggesting pulmonary venous congestion. No focal infiltrate identified. Electronically Signed   By: Gerome Sam III M.D   On: 05/16/2016 18:45    Time Spent in minutes  25   Eddie North M.D on 05/17/2016 at 2:25 PM  Between 7am to 7pm - Pager - [phone]  After 7pm go to [email or web address] - password [facility]  [facility] -  Office  [phone]

## 2016-05-17 NOTE — Progress Notes (Signed)
  Echocardiogram 2D Echocardiogram has been performed.  Russell [name] 05/17/2016, 12:01 PM

## 2016-05-17 NOTE — Progress Notes (Signed)
Patient troponin increased from 0.03 to 0.04. MD Jarvis MorganNiu, X notified. Patient asleep. No chest pain. Will contine to monitor. No new orders.   Antoin Dargis, RN

## 2016-05-17 NOTE — Progress Notes (Signed)
This nurse received report around 21:00 pm 05/16/2016, patient was transported directly to hemodialysis, arrived on 3E department at 2 am on 05/17/2016.  Zyairah Wacha, RN

## 2016-05-17 NOTE — Progress Notes (Signed)
Subjective:  Says feels bloated- agrees that he has failed PD- HD last night- removed 2000- due for another tx today - is about 10 kg over his presumed EDW- BP is good  Objective Vital signs in last 24 hours: Vitals:   05/17/16 0212 05/17/16 0644 05/17/16 0815 05/17/16 0838  BP: (!) 157/95 124/66 126/78   Pulse: (!) 120 88 86   Resp: (!) 22 18 18    Temp: 100 F (37.8 C) 98.4 F (36.9 C) 97.8 F (36.6 C)   TempSrc: Oral Oral Oral   SpO2: 97% 97% 95% 95%  Weight: 125.1 kg (275 lb 14.4 oz)     Height: 6\' 2"  (1.88 m)      Weight change:   Intake/Output Summary (Last 24 hours) at 05/17/16 1008 Last data filed at 05/17/16 [id number]  Gross per 24 hour  Intake               70 ml  Output             2325 ml  Net            -2255 ml    Assessment/ Plan: Pt is a 46 y.o. yo male ESRD/ converted to PD 2.5 months ago who was admitted on 05/16/2016 with volume overload and uremia in the setting of not doing his PD- this is 3rd hosp since he has started Assessment/Plan: 1. Volume overload- not doing PD- also may be having some UF failure.  HD last night and today, then PRN to get volume off-  2. ESRD- PD has not gone well for patient- agrees to go back to HD- [facility] so should not be a lengthy process.  Will see if can get PD cath out this admit 3. Anemia- due to CKD- will give iron and ESA 4. Secondary hyperparathyroidism- has not been under control as OP either- cont calcitriol and phoslo 5. HTN/volume- interesting that BP is under control in hospital but not as OP ?? Because NOT TAKING HIS MEDS. Will hold amlodipine so can UF with HD- cont metoprolol  6. Low grade temp- hopefully just a virus- no belly pain- started on rocephin 7. Incidental lymph node enlargement.  Work up as OP per primary team    Antwanette Wesche A    Labs: Basic Metabolic Panel:  Recent Labs Lab 05/16/16 1740 05/16/16 2018  NA 139  --   K 5.0  --   CL 104  --   CO2  18*  --   GLUCOSE 164*  --   BUN 99*  --   CREATININE 12.03*  --   CALCIUM 6.1*  --   PHOS  --  10.2*   Liver Function Tests:  Recent Labs Lab 05/16/16 1740  AST 16  ALT 14*  ALKPHOS 79  BILITOT 0.6  PROT 5.6*  ALBUMIN 2.8*   No results for input(s): LIPASE, AMYLASE in the last 168 hours. No results for input(s): AMMONIA in the last 168 hours. CBC:  Recent Labs Lab 05/16/16 1740  WBC 9.5  NEUTROABS 7.9*  HGB 8.1*  HCT 24.6*  MCV 86.3  PLT 265   Cardiac Enzymes:  Recent Labs Lab 05/16/16 2000 05/17/16 0208 05/17/16 0711  TROPONINI 0.03* 0.04* 0.04*   CBG:  Recent Labs Lab 05/17/16 0202 05/17/16 0801  GLUCAP 175* 144*    Iron Studies: No results for input(s): IRON, TIBC, TRANSFERRIN, FERRITIN in the last 72 hours. Studies/Results: Ct Angio Chest Pe  W Or Wo Contrast  Result Date: 05/16/2016 CLINICAL DATA:  Shortness of breath, centralized chest pain radiating to the left, starting yesterday. Hypoxia. History of DVT, hypertension, CABG. EXAM: CT ANGIOGRAPHY CHEST WITH CONTRAST TECHNIQUE: Multidetector CT imaging of the chest was performed using the standard protocol during bolus administration of intravenous contrast. Multiplanar CT image reconstructions and MIPs were obtained to evaluate the vascular anatomy. CONTRAST:  100 cc Isovue 370 COMPARISON:  CTA chest dated 07/19/2015. FINDINGS: Cardiovascular: The peripheral segmental and subsegmental pulmonary arteries cannot be definitively characterized due to patient breathing motion artifact. There is no central obstructing pulmonary embolism identified within the main or central lobar pulmonary arteries bilaterally. No obstructing pulmonary embolism identified within the central segmental pulmonary arteries. No aortic aneurysm or dissection. There is mild cardiomegaly. No pericardial effusion. Coronary artery calcifications noted. Patient is status post CABG. Mediastinum/Nodes: Enlarged lymph nodes noted within the  right paratracheal mediastinum. Lymph node measuring 1.6 cm short axis dimension is seen on series 5, image 21. Additional enlarged lymph node is seen within the more inferior right paratracheal mediastinum measuring 1.9 cm short axis dimension on series 5, image 27. These lymph nodes measured 8 mm and 10 mm short axis dimension respectively on CT of 07/19/2015. Esophagus appears normal. Trachea and central bronchi are unremarkable. Lungs/Pleura: Scattered ground-glass opacities bilaterally, perihilar predominant, most likely pulmonary edema. No pleural effusion or pneumothorax seen. Mild atelectasis at the left lung base. Upper Abdomen: No acute abnormality. Musculoskeletal: No chest wall abnormality. No acute or significant osseous findings. Review of the MIP images confirms the above findings. IMPRESSION: 1. No pulmonary embolism seen, with study limitations detailed above. The most peripheral segmental and subsegmental pulmonary arteries cannot be definitively characterized due to patient breathing motion artifact. No central obstructing pulmonary embolism identified. 2. Scattered ground-glass opacities bilaterally, perihilar predominant, most likely pulmonary edema related to CHF/volume overload. Differential for ground-glass opacities also includes atypical pneumonias such as viral or fungal, interstitial pneumonias, chronic interstitial diseases, hypersensitivity pneumonitis, and respiratory bronchiolitis. 3. Cardiomegaly. 4. **An incidental finding of potential clinical significance has been found. Interval enlargement of 2 lymph nodes in the right paratracheal mediastinum, measurements and positions given above, largest measuring 1.9 cm short axis dimension, both pathologic by CT size criteria. Neoplastic lymphadenopathy cannot be excluded. Consider further characterization with nonemergent PET-CT. ** Electronically Signed   By: Bary Richard M.D.   On: 05/16/2016 18:44   Dg Chest Port 1 View  Result  Date: 05/16/2016 CLINICAL DATA:  Shortness of breath.  Left-sided chest pain. EXAM: PORTABLE CHEST 1 VIEW COMPARISON:  March 09, 2016 FINDINGS: Cardiomegaly. The hila and mediastinum are normal. Mild interstitial prominence. No focal infiltrate. IMPRESSION: Cardiomegaly. Mild interstitial prominence suggesting pulmonary venous congestion. No focal infiltrate identified. Electronically Signed   By: Gerome Sam III M.D   On: 05/16/2016 18:45   Medications: Infusions: . cefTRIAXone (ROCEPHIN)  IV Stopped (05/17/16 0414)    Scheduled Medications: . amLODipine  10 mg Oral Daily  . aspirin EC  81 mg Oral Daily  . atorvastatin  80 mg Oral q1800  . calcitRIOL  0.25 mcg Oral Q M,W,F-HD  . calcium acetate  1,334 mg Oral TID WC  . clopidogrel  75 mg Oral Daily  . cyclobenzaprine  10 mg Oral QHS  . gabapentin  800 mg Oral BID  . heparin  5,000 Units Subcutaneous Q8H  . hydrOXYzine  25 mg Oral QHS  . insulin aspart  0-5 Units Subcutaneous QHS  . insulin aspart  0-9 Units Subcutaneous TID WC  . insulin glargine  20 Units Subcutaneous Daily  . ipratropium-albuterol  3 mL Nebulization BID  . levothyroxine  50 mcg Oral QAC breakfast  . mouth rinse  15 mL Mouth Rinse BID  . metoCLOPramide  5 mg Oral BID  . metoprolol tartrate  50 mg Oral BID    have reviewed scheduled and prn medications.  Physical Exam: General: obese,  NAD Heart: HR 80s, reg Lungs: dec BS at bases Abdomen: obese, distended- PD cath in place Extremities: pitting edema Dialysis Access: left AVF    05/17/2016,10:08 AM  LOS: 0 days

## 2016-05-17 NOTE — Consult Note (Signed)
[facility] Surgery Consult Note  Russell [name] [birth year]  [id number].    Requesting MD: Soledad Gerlach Chief Complaint/Reason for Consult: PD catheter removal HPI:  Patient is a  46 y.o. male with medical history significant of ESRD on PD, IDDM, HTN, CAD s/p CABG, asthma, depression, anxiety, R leg DVT (completed 6 months of treatment) and hypothyroidismwho presented to [facility] with chest pain, shortness breath and fever. We are being consulted for removal of PD catheter. Patient has had catheter in place for about 2.5 months and is not getting adequate relief of volume overload. Denies any abdominal pain, erythema or drainage around site of catheter. Has had no other issues with catheter. PD catheter was placed at [facility]. Plans to go on HD instead.    ROS: Review of Systems  Constitutional: Positive for chills and fever.  Cardiovascular: Positive for chest pain and leg swelling.  Gastrointestinal: Negative for abdominal pain, diarrhea, nausea and vomiting.  Genitourinary: Negative for dysuria and hematuria.  Skin: Negative for itching and rash.  All other systems reviewed and are negative.   Family History  Problem Relation Age of Onset  . Obesity Mother        Patient states that family members have no other medical illnesses other than what I have described  . Kidney cancer Maternal Grandmother   . Cancer Father 27       AML  . Heart disease Unknown        No family history    Past Medical History:  Diagnosis Date  . Age-related macular degeneration, wet, both eyes ([place])    "I'm getting Alia treatments" (12/30/2014)  . Anxiety   . Asthma   . CKD stage 3 due to type 2 diabetes mellitus ([place])   . 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 Daily headache   . Depression   . DVT (deep venous thrombosis), H/o 01/2014-on Xarelto  03/24/2014   LLE  . Hypertension   . Hypothyroidism   . Nephrotic syndrome 05/18/2014  . NSTEMI (non-ST elevated myocardial infarction) ([place])   . Pneumonia 11/2013  . Renal insufficiency   . Secondary DM with DKA-AG=16, BIcarb Nl 03/24/2014  . Type 2 diabetes mellitus with diabetic nephropathy [facility])     Past Surgical History:  Procedure Laterality Date  . ANKLE FRACTURE SURGERY Right 1988  . AV FISTULA PLACEMENT Left 01/01/2015   Procedure: CREATION OF LEFT RADIAL CEPHALIC ARTERIOVENOUS (AV) FISTULA ;  Surgeon: Mal Misty, MD;  Location: [place];  Service: Vascular;  Laterality: Left;  . CARDIAC CATHETERIZATION N/A 09/22/2015   Procedure: Left Heart Cath and Coronary Angiography;  Surgeon: Wellington Hampshire, MD;  Location: [place] CV LAB;  Service: Cardiovascular;  Laterality: N/A;  . CORONARY ARTERY BYPASS GRAFT N/A 09/28/2015   Procedure: CORONARY ARTERY BYPASS GRAFTING (CABG) x 3 UTILIZING LEFT MAMMARY ARTERY AND ENDOSCOPICALLY HARVESTED LEFT GREATER SAPHENOUS VEIN.;  Surgeon: [name], MD;  Location: [place];  Service: Open Heart Surgery;  Laterality: N/A;  . ENDOVEIN HARVEST OF GREATER SAPHENOUS VEIN Left 09/28/2015   Procedure: ENDOVEIN HARVEST OF GREATER SAPHENOUS VEIN;  Surgeon: [name], MD;  Location: [place];  Service: Open Heart Surgery;  Laterality: Left;  . ESOPHAGOGASTRODUODENOSCOPY N/A 03/29/2014   Procedure: ESOPHAGOGASTRODUODENOSCOPY (EGD);  Surgeon: Teena Irani, MD;  Location: [facility];  Service: Endoscopy;  Laterality: N/A;  . EYE SURGERY    . FRACTURE SURGERY    . INSERTION OF DIALYSIS CATHETER Right 01/05/2015   Procedure: INSERTION OF RIGHT INTERNAL JUGULAR DIALYSIS CATHETER;  Surgeon: Conrad Door, MD;  Location: [place];  Service: Vascular;  Laterality: Right;  . TEE WITHOUT CARDIOVERSION N/A 09/28/2015   Procedure: TRANSESOPHAGEAL ECHOCARDIOGRAM (TEE);  Surgeon: [name], MD;  Location: [place];  Service: Open Heart Surgery;  Laterality: N/A;   . TONSILLECTOMY AND ADENOIDECTOMY  1970s    Social History:  reports that he quit smoking about 2 months ago. His smoking use included Cigarettes. He has never used smokeless tobacco. He reports that he drinks alcohol. He reports that he does not use drugs.  Allergies:  Allergies  Allergen Reactions  . Ibuprofen Other (See Comments)    MD told patient not to take due to kidney disease  . Nsaids Other (See Comments)    Told to avoid all nsaids due to kidney disease   . Tape Other (See Comments)    Welts result, if left for a long amount of time    Medications Prior to Admission  Medication Sig Dispense Refill  . acetaminophen (TYLENOL) 500 MG tablet Take 1 tablet (500 mg total) by mouth every 6 (six) hours as needed for moderate pain. (Patient taking differently: Take 1,000 mg by mouth every 6 (six) hours as needed (for pain). ) 30 tablet 0  . albuterol (PROVENTIL HFA;VENTOLIN HFA) 108 (90 Base) MCG/ACT inhaler Inhale 1 puff into the lungs every 6 (six) hours as needed for wheezing or shortness of breath. 1 Inhaler 11  . albuterol (PROVENTIL) (2.5 MG/3ML) 0.083% nebulizer solution Take 2.5 mg by nebulization every 6 (six) hours as needed for wheezing.    Marland Kitchen albuterol-ipratropium (COMBIVENT) 18-103 MCG/ACT inhaler Inhale 2 puffs into the lungs every 4 (four) hours. (Patient taking differently: Inhale 2 puffs into the lungs every 6 (six) hours as needed for wheezing or shortness of breath. ) 1 Inhaler 11  . amLODipine (NORVASC) 10 MG tablet Take 1 tablet (10 mg total) by mouth daily. 90 tablet 1  . aspirin EC 81 MG EC tablet Take 1 tablet (81 mg total) by mouth daily.    Marland Kitchen atorvastatin (LIPITOR) 80 MG tablet Take 1 tablet (80 mg total) by mouth daily at 6 PM. 90 tablet 1  . calcitRIOL (ROCALTROL) 0.25 MCG capsule Take 1 capsule (0.25 mcg total) by mouth every Monday, Wednesday, and Friday with hemodialysis. 30 capsule 3  . calcium acetate (PHOSLO) 667 MG capsule Take 2 capsules (1,334 mg  total) by mouth 3 (three) times daily with meals. 180 capsule 0  . clopidogrel (PLAVIX) 75 MG tablet Take 1 tablet (75 mg total) by mouth daily. 90 tablet 3  . cyclobenzaprine (FLEXERIL) 10 MG tablet Take 1 tablet (10 mg total) by mouth at bedtime. 30 tablet 11  . gabapentin (NEURONTIN) 100 MG capsule Take 2 capsules (200 mg total) by mouth 3 (three) times daily. (Patient taking differently: Take 800 mg by mouth 2 (two) times daily. ) 180 capsule 11  . hydrOXYzine (ATARAX/VISTARIL) 25 MG tablet Take 25 mg by mouth at bedtime.     . insulin aspart (NOVOLOG) 100 UNIT/ML injection Inject 5-10 Units into the skin 3 (three) times daily with meals. PER SLIDING SCALE    . insulin glargine (LANTUS) 100 UNIT/ML injection Inject 0.3 mLs (30 Units total) into the skin 2 (two) times daily. (Patient taking differently: Inject 40 Units into  the skin 2 (two) times daily. ) 10 mL 11  . insulin lispro (HUMALOG) 100 UNIT/ML injection Inject 0.15-0.3 mLs (15-30 Units total) into the skin every evening. Take one unit per 5 carbs depending on dinner. 10 mL 2  . levothyroxine (SYNTHROID, LEVOTHROID) 50 MCG tablet Take 1 tablet (50 mcg total) by mouth daily. 90 tablet 3  . metoCLOPramide (REGLAN) 5 MG tablet Take 1 tablet (5 mg total) by mouth every 8 (eight) hours as needed for nausea or vomiting. (Patient taking differently: Take 5 mg by mouth 2 (two) times daily. ) 30 tablet 0  . metoprolol (LOPRESSOR) 50 MG tablet Take 50 mg by mouth 2 (two) times daily.      Blood pressure 126/78, pulse 86, temperature 97.8 F (36.6 C), temperature source Oral, resp. rate 18, height _0  (1.88 m), weight 125.1 kg (275 lb 14.4 oz), SpO2 95 %. Physical Exam: Physical Exam  Constitutional: He is oriented to person, place, and time. He appears well-developed and well-nourished. He is cooperative. No distress.  HENT:  Head: Normocephalic and atraumatic.  Right Ear: External ear normal.  Left Ear: External ear normal.  Nose: Nose  normal.  Eyes: Conjunctivae are normal. No scleral icterus.  Neck: Normal range of motion. Neck supple. No tracheal deviation present.  Cardiovascular: Normal rate, regular rhythm and intact distal pulses.  Exam reveals no gallop and no friction rub.   No murmur heard. Pulses:      Radial pulses are 2+ on the right side, and 2+ on the left side.       Dorsalis pedis pulses are 1+ on the right side, and 1+ on the left side.  Pulmonary/Chest: Effort normal. No stridor. No respiratory distress. He has no wheezes. He has no rhonchi. He has rales in the right lower field and the left lower field.  Abdominal: Soft. Normal appearance and bowel sounds are normal. He exhibits no distension. There is no hepatosplenomegaly. There is no tenderness. There is no rebound and no guarding.  PD catheter in place LUQ, no erythema or drainage present  Musculoskeletal: Normal range of motion.  Neurological: He is alert and oriented to person, place, and time. He has normal strength. No cranial nerve deficit.  Skin: Skin is warm, dry and intact. He is not diaphoretic.  Psychiatric: He has a normal mood and affect. His speech is normal and behavior is normal. Judgment and thought content normal. Cognition and memory are normal.    Results for orders placed or performed during the hospital encounter of 05/16/16 (from the past 48 hour(s))  CBC with Differential/Platelet     Status: Abnormal   Collection Time: 05/16/16  5:40 PM  Result Value Ref Range   WBC 9.5 4.0 - 10.5 K/uL   RBC 2.85 (L) 4.22 - 5.81 MIL/uL   Hemoglobin 8.1 (L) 13.0 - 17.0 g/dL   HCT 24.6 (L) 39.0 - 52.0 %   MCV 86.3 78.0 - 100.0 fL   MCH 28.4 26.0 - 34.0 pg   MCHC 32.9 30.0 - 36.0 g/dL   RDW 15.7 (H) 11.5 - 15.5 %   Platelets 265 150 - 400 K/uL   Neutrophils Relative % 82 %   Neutro Abs 7.9 (H) 1.7 - 7.7 K/uL   Lymphocytes Relative 10 %   Lymphs Abs 1.0 0.7 - 4.0 K/uL   Monocytes Relative 6 %   Monocytes Absolute 0.5 0.1 - 1.0 K/uL    Eosinophils Relative 2 %   Eosinophils Absolute 0.2  0.0 - 0.7 K/uL   Basophils Relative 0 %   Basophils Absolute 0.0 0.0 - 0.1 K/uL  Comprehensive metabolic panel     Status: Abnormal   Collection Time: 05/16/16  5:40 PM  Result Value Ref Range   Sodium 139 135 - 145 mmol/L   Potassium 5.0 3.5 - 5.1 mmol/L   Chloride 104 101 - 111 mmol/L   CO2 18 (L) 22 - 32 mmol/L   Glucose, Bld 164 (H) 65 - 99 mg/dL   BUN 99 (H) 6 - 20 mg/dL   Creatinine, Ser 12.03 (H) 0.61 - 1.24 mg/dL   Calcium 6.1 (LL) 8.9 - 10.3 mg/dL    Comment: CRITICAL RESULT CALLED TO, READ BACK BY AND VERIFIED WITHRaeford Razor RN [phone] [id number] GREEN R    Total Protein 5.6 (L) 6.5 - 8.1 g/dL   Albumin 2.8 (L) 3.5 - 5.0 g/dL   AST 16 15 - 41 U/L   ALT 14 (L) 17 - 63 U/L   Alkaline Phosphatase 79 38 - 126 U/L   Total Bilirubin 0.6 0.3 - 1.2 mg/dL   GFR calc non Af Amer 4 (L) >60 mL/min   GFR calc Af Amer 5 (L) >60 mL/min    Comment: (NOTE) The eGFR has been calculated using the CKD EPI equation. This calculation has not been validated in all clinical situations. eGFR's persistently <60 mL/min signify possible Chronic Kidney Disease.    Anion gap 17 (H) 5 - 15  Brain natriuretic peptide     Status: Abnormal   Collection Time: 05/16/16  5:40 PM  Result Value Ref Range   B Natriuretic Peptide 662.2 (H) 0.0 - 100.0 pg/mL  I-stat troponin, ED     Status: None   Collection Time: 05/16/16  5:44 PM  Result Value Ref Range   Troponin i, poc 0.02 0.00 - 0.08 ng/mL   Comment 3            Comment: Due to the release kinetics of cTnI, a negative result within the first hours of the onset of symptoms does not rule out myocardial infarction with certainty. If myocardial infarction is still suspected, repeat the test at appropriate intervals.   I-Stat venous blood gas, ED     Status: Abnormal   Collection Time: 05/16/16  6:06 PM  Result Value Ref Range   pH, Ven 7.368 7.250 - 7.430   pCO2, Ven 35.5 (L) 44.0 - 60.0 mmHg    pO2, Ven 46.0 (H) 32.0 - 45.0 mmHg   Bicarbonate 20.3 20.0 - 28.0 mmol/L   TCO2 21 0 - 100 mmol/L   O2 Saturation 78.0 %   Acid-base deficit 4.0 (H) 0.0 - 2.0 mmol/L   Patient temperature 100.0 F    Collection site RADIAL, ALLEN'S TEST ACCEPTABLE    Drawn by RT    Sample type MIXED VENOUS SAMPLE   Rapid urine drug screen (hospital performed)     Status: None   Collection Time: 05/16/16  7:44 PM  Result Value Ref Range   Opiates NONE DETECTED NONE DETECTED   Cocaine NONE DETECTED NONE DETECTED   Benzodiazepines NONE DETECTED NONE DETECTED   Amphetamines NONE DETECTED NONE DETECTED   Tetrahydrocannabinol NONE DETECTED NONE DETECTED   Barbiturates NONE DETECTED NONE DETECTED    Comment:        DRUG SCREEN FOR MEDICAL PURPOSES ONLY.  IF CONFIRMATION IS NEEDED FOR ANY PURPOSE, NOTIFY LAB WITHIN 5 DAYS.        LOWEST DETECTABLE  LIMITS FOR URINE DRUG SCREEN Drug Class       Cutoff (ng/mL) Amphetamine      1000 Barbiturate      200 Benzodiazepine   197 Tricyclics       588 Opiates          300 Cocaine          300 THC              50   Urinalysis, Routine w reflex microscopic     Status: Abnormal   Collection Time: 05/16/16  7:44 PM  Result Value Ref Range   Color, Urine YELLOW YELLOW   APPearance CLEAR CLEAR   Specific Gravity, Urine 1.017 1.005 - 1.030   pH 6.0 5.0 - 8.0   Glucose, UA >=500 (A) NEGATIVE mg/dL   Hgb urine dipstick SMALL (A) NEGATIVE   Bilirubin Urine NEGATIVE NEGATIVE   Ketones, ur NEGATIVE NEGATIVE mg/dL   Protein, ur >=300 (A) NEGATIVE mg/dL   Nitrite NEGATIVE NEGATIVE   Leukocytes, UA NEGATIVE NEGATIVE   RBC / HPF 0-5 0 - 5 RBC/hpf   WBC, UA 0-5 0 - 5 WBC/hpf   Bacteria, UA RARE (A) NONE SEEN   Squamous Epithelial / LPF 0-5 (A) NONE SEEN   Non Squamous Epithelial 6-30 (A) NONE SEEN  Lactic acid, plasma     Status: None   Collection Time: 05/16/16  7:45 PM  Result Value Ref Range   Lactic Acid, Venous 0.6 0.5 - 1.9 mmol/L  Troponin I (q 6hr x 3)      Status: Abnormal   Collection Time: 05/16/16  8:00 PM  Result Value Ref Range   Troponin I 0.03 (HH) <0.03 ng/mL    Comment: CRITICAL RESULT CALLED TO, READ BACK BY AND VERIFIED WITH: GUIJOZA A,RN 05/16/16 2107 WAYK   Lactic acid, plasma     Status: None   Collection Time: 05/16/16  8:00 PM  Result Value Ref Range   Lactic Acid, Venous 0.6 0.5 - 1.9 mmol/L  Procalcitonin     Status: None   Collection Time: 05/16/16  8:00 PM  Result Value Ref Range   Procalcitonin 0.39 ng/mL    Comment:        Interpretation: PCT (Procalcitonin) <= 0.5 ng/mL: Systemic infection (sepsis) is not likely. Local bacterial infection is possible. (NOTE)         ICU PCT Algorithm               Non ICU PCT Algorithm    ----------------------------     ------------------------------         PCT < 0.25 ng/mL                 PCT < 0.1 ng/mL     Stopping of antibiotics            Stopping of antibiotics       strongly encouraged.               strongly encouraged.    ----------------------------     ------------------------------       PCT level decrease by               PCT < 0.25 ng/mL       >= 80% from peak PCT       OR PCT 0.25 - 0.5 ng/mL          Stopping of antibiotics  encouraged.     Stopping of antibiotics           encouraged.    ----------------------------     ------------------------------       PCT level decrease by              PCT >= 0.25 ng/mL       < 80% from peak PCT        AND PCT >= 0.5 ng/mL            Continuin g antibiotics                                              encouraged.       Continuing antibiotics            encouraged.    ----------------------------     ------------------------------     PCT level increase compared          PCT > 0.5 ng/mL         with peak PCT AND          PCT >= 0.5 ng/mL             Escalation of antibiotics                                          strongly encouraged.      Escalation of  antibiotics        strongly encouraged.   Protime-INR     Status: Abnormal   Collection Time: 05/16/16  8:00 PM  Result Value Ref Range   Prothrombin Time 15.8 (H) 11.4 - 15.2 seconds   INR 1.25   APTT     Status: None   Collection Time: 05/16/16  8:00 PM  Result Value Ref Range   aPTT 35 24 - 36 seconds  Phosphorus     Status: Abnormal   Collection Time: 05/16/16  8:18 PM  Result Value Ref Range   Phosphorus 10.2 (H) 2.5 - 4.6 mg/dL  Glucose, capillary     Status: Abnormal   Collection Time: 05/17/16  2:02 AM  Result Value Ref Range   Glucose-Capillary 175 (H) 65 - 99 mg/dL   Comment 1 Notify RN    Comment 2 Document in Chart   Troponin I (q 6hr x 3)     Status: Abnormal   Collection Time: 05/17/16  2:08 AM  Result Value Ref Range   Troponin I 0.04 (HH) <0.03 ng/mL    Comment: CRITICAL VALUE NOTED.  VALUE IS CONSISTENT WITH PREVIOUSLY REPORTED AND CALLED VALUE.  Lipid panel     Status: Abnormal   Collection Time: 05/17/16  2:08 AM  Result Value Ref Range   Cholesterol 196 0 - 200 mg/dL   Triglycerides 246 (H) <150 mg/dL   HDL 32 (L) >40 mg/dL   Total CHOL/HDL Ratio 6.1 RATIO   VLDL 49 (H) 0 - 40 mg/dL   LDL Cholesterol 115 (H) 0 - 99 mg/dL    Comment:        Total Cholesterol/HDL:CHD Risk Coronary Heart Disease Risk Table                     Men   Women  1/2 Average Risk  3.4   3.3  Average Risk       5.0   4.4  2 X Average Risk   9.6   7.1  3 X Average Risk  23.4   11.0        Use the calculated Patient Ratio above and the CHD Risk Table to determine the patient's CHD Risk.        ATP III CLASSIFICATION (LDL):  <100     mg/dL   Optimal  100-129  mg/dL   Near or Above                    Optimal  130-159  mg/dL   Borderline  160-189  mg/dL   High  >190     mg/dL   Very High   MRSA PCR Screening     Status: None   Collection Time: 05/17/16  2:08 AM  Result Value Ref Range   MRSA by PCR NEGATIVE NEGATIVE    Comment:        The GeneXpert MRSA Assay  (FDA approved for NASAL specimens only), is one component of a comprehensive MRSA colonization surveillance program. It is not intended to diagnose MRSA infection nor to guide or monitor treatment for MRSA infections.   Troponin I (q 6hr x 3)     Status: Abnormal   Collection Time: 05/17/16  7:11 AM  Result Value Ref Range   Troponin I 0.04 (HH) <0.03 ng/mL    Comment: CRITICAL VALUE NOTED.  VALUE IS CONSISTENT WITH PREVIOUSLY REPORTED AND CALLED VALUE.  Iron and TIBC     Status: Abnormal   Collection Time: 05/17/16  7:11 AM  Result Value Ref Range   Iron 17 (L) 45 - 182 ug/dL   TIBC 216 (L) 250 - 450 ug/dL   Saturation Ratios 8 (L) 17.9 - 39.5 %   UIBC 199 ug/dL  Ferritin     Status: None   Collection Time: 05/17/16  7:11 AM  Result Value Ref Range   Ferritin 139 24 - 336 ng/mL  Glucose, capillary     Status: Abnormal   Collection Time: 05/17/16  8:01 AM  Result Value Ref Range   Glucose-Capillary 144 (H) 65 - 99 mg/dL   Comment 1 Notify RN    Ct Angio Chest Pe W Or Wo Contrast  Result Date: 05/16/2016 CLINICAL DATA:  Shortness of breath, centralized chest pain radiating to the left, starting yesterday. Hypoxia. History of DVT, hypertension, CABG. EXAM: CT ANGIOGRAPHY CHEST WITH CONTRAST TECHNIQUE: Multidetector CT imaging of the chest was performed using the standard protocol during bolus administration of intravenous contrast. Multiplanar CT image reconstructions and MIPs were obtained to evaluate the vascular anatomy. CONTRAST:  100 cc Isovue 370 COMPARISON:  CTA chest dated 07/19/2015. FINDINGS: Cardiovascular: The peripheral segmental and subsegmental pulmonary arteries cannot be definitively characterized due to patient breathing motion artifact. There is no central obstructing pulmonary embolism identified within the main or central lobar pulmonary arteries bilaterally. No obstructing pulmonary embolism identified within the central segmental pulmonary arteries. No aortic  aneurysm or dissection. There is mild cardiomegaly. No pericardial effusion. Coronary artery calcifications noted. Patient is status post CABG. Mediastinum/Nodes: Enlarged lymph nodes noted within the right paratracheal mediastinum. Lymph node measuring 1.6 cm short axis dimension is seen on series 5, image 21. Additional enlarged lymph node is seen within the more inferior right paratracheal mediastinum measuring 1.9 cm short axis dimension on series 5, image 27. These lymph nodes measured 8  mm and 10 mm short axis dimension respectively on CT of 07/19/2015. Esophagus appears normal. Trachea and central bronchi are unremarkable. Lungs/Pleura: Scattered ground-glass opacities bilaterally, perihilar predominant, most likely pulmonary edema. No pleural effusion or pneumothorax seen. Mild atelectasis at the left lung base. Upper Abdomen: No acute abnormality. Musculoskeletal: No chest wall abnormality. No acute or significant osseous findings. Review of the MIP images confirms the above findings. IMPRESSION: 1. No pulmonary embolism seen, with study limitations detailed above. The most peripheral segmental and subsegmental pulmonary arteries cannot be definitively characterized due to patient breathing motion artifact. No central obstructing pulmonary embolism identified. 2. Scattered ground-glass opacities bilaterally, perihilar predominant, most likely pulmonary edema related to CHF/volume overload. Differential for ground-glass opacities also includes atypical pneumonias such as viral or fungal, interstitial pneumonias, chronic interstitial diseases, hypersensitivity pneumonitis, and respiratory bronchiolitis. 3. Cardiomegaly. 4. **An incidental finding of potential clinical significance has been found. Interval enlargement of 2 lymph nodes in the right paratracheal mediastinum, measurements and positions given above, largest measuring 1.9 cm short axis dimension, both pathologic by CT size criteria. Neoplastic  lymphadenopathy cannot be excluded. Consider further characterization with nonemergent PET-CT. ** Electronically Signed   By: Franki Cabot M.D.   On: 05/16/2016 18:44   Dg Chest Port 1 View  Result Date: 05/16/2016 CLINICAL DATA:  Shortness of breath.  Left-sided chest pain. EXAM: PORTABLE CHEST 1 VIEW COMPARISON:  March 09, 2016 FINDINGS: Cardiomegaly. The hila and mediastinum are normal. Mild interstitial prominence. No focal infiltrate. IMPRESSION: Cardiomegaly. Mild interstitial prominence suggesting pulmonary venous congestion. No focal infiltrate identified. Electronically Signed   By: Dorise Bullion III M.D   On: 05/16/2016 18:45      Assessment/Plan ESRD on PD - patient does not appear bacteremic from PD or appear to have emergent problems with PD catheter - PD catheter removal is non-emergent - Will discuss removal of PD catheter during this admission vs. Elective removal at [facility] where it was placed  Brigid Re, [facility] 05/17/2016, 11:09 AM Pager: [phone] Consults: [phone] Mon-Fri 7:00 am-4:30 pm Sat-Sun 7:00 am-11:30 am

## 2016-05-18 DIAGNOSIS — R509 Fever, unspecified: Secondary | ICD-10-CM

## 2016-05-18 LAB — URINALYSIS, ROUTINE W REFLEX MICROSCOPIC
BILIRUBIN URINE: NEGATIVE
HGB URINE DIPSTICK: NEGATIVE
KETONES UR: NEGATIVE mg/dL
Leukocytes, UA: NEGATIVE
NITRITE: NEGATIVE
Protein, ur: >=300 mg/dL — AB
Specific Gravity, Urine: 1.018 (ref 1.005–1.030)
Squamous Epithelial / LPF: NONE SEEN
pH: 7 (ref 5.0–8.0)

## 2016-05-18 LAB — RENAL FUNCTION PANEL
ALBUMIN: 2.5 g/dL — AB (ref 3.5–5.0)
Anion gap: 11 (ref 5–15)
BUN: 31 mg/dL — ABNORMAL HIGH (ref 6–20)
CO2: 30 mmol/L (ref 22–32)
Calcium: 7.4 mg/dL — ABNORMAL LOW (ref 8.9–10.3)
Chloride: 95 mmol/L — ABNORMAL LOW (ref 101–111)
Creatinine, Ser: 5.81 mg/dL — ABNORMAL HIGH (ref 0.61–1.24)
GFR calc non Af Amer: 11 mL/min — ABNORMAL LOW (ref >60)
GFR, EST AFRICAN AMERICAN: 12 mL/min — AB (ref >60)
GLUCOSE: 256 mg/dL — AB (ref 65–99)
PHOSPHORUS: 6 mg/dL — AB (ref 2.5–4.6)
POTASSIUM: 4.3 mmol/L (ref 3.5–5.1)
Sodium: 136 mmol/L (ref 135–145)

## 2016-05-18 LAB — GLUCOSE, CAPILLARY
GLUCOSE-CAPILLARY: 310 mg/dL — AB (ref 65–99)
Glucose-Capillary: 175 mg/dL — ABNORMAL HIGH (ref 65–99)
Glucose-Capillary: 178 mg/dL — ABNORMAL HIGH (ref 65–99)
Glucose-Capillary: 297 mg/dL — ABNORMAL HIGH (ref 65–99)

## 2016-05-18 LAB — URINE CULTURE: Culture: NO GROWTH

## 2016-05-18 LAB — HEMOGLOBIN A1C
Hgb A1c MFr Bld: 12.8 % — ABNORMAL HIGH (ref 4.8–5.6)
MEAN PLASMA GLUCOSE: 321 mg/dL

## 2016-05-18 LAB — PARATHYROID HORMONE, INTACT (NO CA): PTH: 342 pg/mL — AB (ref 15–65)

## 2016-05-18 MED ORDER — HEPARIN SODIUM (PORCINE) 1000 UNIT/ML DIALYSIS
20.0000 [IU]/kg | INTRAMUSCULAR | Status: DC | PRN
  Filled 2016-05-18: qty 3

## 2016-05-18 MED ORDER — INSULIN GLARGINE 100 UNIT/ML ~~LOC~~ SOLN
30.0000 [IU] | Freq: Two times a day (BID) | SUBCUTANEOUS | Status: DC
  Administered 2016-05-18: 30 [IU] via SUBCUTANEOUS
  Filled 2016-05-18 (×3): qty 0.3

## 2016-05-18 NOTE — Progress Notes (Signed)
Subjective:  S/p HD last night- removed almost 4 liters- do not have post weight but pre weight was 121 so presumable 117ish (edw 115).  I appreciate surgery input- for PD cath removal on Friday.  Still with low grade temps  Objective Vital signs in last 24 hours: Vitals:   05/17/16 2109 05/17/16 2246 05/18/16 0132 05/18/16 0510  BP: (!) 162/80 (!) 129/51 138/75 104/64  Pulse: (!) 104 (!) 111 100 82  Resp: 18 18 18 16   Temp: 98.7 F (37.1 C) 100.3 F (37.9 C) 100.2 F (37.9 C) 97.7 F (36.5 C)  TempSrc: Oral Oral Oral Oral  SpO2: 99% 96% 95% 96%  Weight:    121.8 kg (268 lb 8 oz)  Height:       Weight change: -4.1 kg (-9 lb 0.6 oz)  Intake/Output Summary (Last 24 hours) at 05/18/16 0756 Last data filed at 05/18/16 1610  Gross per 24 hour  Intake              340 ml  Output             4285 ml  Net            -3945 ml    Assessment/ Plan: Pt is a 46 y.o. yo male ESRD/ converted to PD 2.5 months ago who was admitted on 05/16/2016 with volume overload and uremia in the setting of not doing his PD- this is 3rd hosp since he has started Assessment/Plan: 1. Volume overload- not doing PD- also may be having some UF failure.  HD Wed night and yesterday, will actually plan for again today, since will be having surgery to get PD cath out tomorrow.  Then my tentative plan would be to watch overnight after that- send home after HD on Saturday if OK with primary 2. ESRD- PD has not gone well for patient- agrees to go back to HD- will need to CLIP to [place], in process- was there before so should not be a lengthy process.   PD cath out this admit- scheduled for Friday  3. Anemia- due to CKD- on iron and ESA 4. Secondary hyperparathyroidism- has not been under control as OP either- cont calcitriol and phoslo 5. HTN/volume- interesting that BP is under control in hospital but not as OP ?? Because NOT TAKING HIS MEDS. Will hold amlodipine so can UF with HD- cont metoprolol  6. Low grade temp-  hopefully just a virus- no belly pain- started on rocephin- cultures negative 7. Incidental lymph node enlargement.  Work up as OP per primary team- I did inform patient so he is aware    Kellene Mccleary A    Labs: Basic Metabolic Panel:  Recent Labs Lab 05/16/16 1740 05/16/16 2018 05/17/16 1141 05/18/16 0357  NA 139  --  137 136  K 5.0  --  4.7 4.3  CL 104  --  99* 95*  CO2 18*  --  23 30  GLUCOSE 164*  --  278* 256*  BUN 99*  --  59* 31*  CREATININE 12.03*  --  8.64* 5.81*  CALCIUM 6.1*  --  6.9* 7.4*  PHOS  --  10.2* 8.7* 6.0*   Liver Function Tests:  Recent Labs Lab 05/16/16 1740 05/17/16 1141 05/18/16 0357  AST 16  --   --   ALT 14*  --   --   ALKPHOS 79  --   --   BILITOT 0.6  --   --   PROT 5.6*  --   --  ALBUMIN 2.8* 2.6* 2.5*   No results for input(s): LIPASE, AMYLASE in the last 168 hours. No results for input(s): AMMONIA in the last 168 hours. CBC:  Recent Labs Lab 05/16/16 1740 05/17/16 1141  WBC 9.5 7.2  NEUTROABS 7.9*  --   HGB 8.1* 8.1*  HCT 24.6* 25.8*  MCV 86.3 86.9  PLT 265 268   Cardiac Enzymes:  Recent Labs Lab 05/16/16 2000 05/17/16 0208 05/17/16 0711  TROPONINI 0.03* 0.04* 0.04*   CBG:  Recent Labs Lab 05/17/16 0202 05/17/16 0801 05/17/16 1156 05/17/16 2252  GLUCAP 175* 144* 266* 228*    Iron Studies:   Recent Labs  05/17/16 0711  IRON 17*  TIBC 216*  FERRITIN 139   Studies/Results: Ct Angio Chest Pe W Or Wo Contrast  Result Date: 05/16/2016 CLINICAL DATA:  Shortness of breath, centralized chest pain radiating to the left, starting yesterday. Hypoxia. History of DVT, hypertension, CABG. EXAM: CT ANGIOGRAPHY CHEST WITH CONTRAST TECHNIQUE: Multidetector CT imaging of the chest was performed using the standard protocol during bolus administration of intravenous contrast. Multiplanar CT image reconstructions and MIPs were obtained to evaluate the vascular anatomy. CONTRAST:  100 cc Isovue 370 COMPARISON:   CTA chest dated 07/19/2015. FINDINGS: Cardiovascular: The peripheral segmental and subsegmental pulmonary arteries cannot be definitively characterized due to patient breathing motion artifact. There is no central obstructing pulmonary embolism identified within the main or central lobar pulmonary arteries bilaterally. No obstructing pulmonary embolism identified within the central segmental pulmonary arteries. No aortic aneurysm or dissection. There is mild cardiomegaly. No pericardial effusion. Coronary artery calcifications noted. Patient is status post CABG. Mediastinum/Nodes: Enlarged lymph nodes noted within the right paratracheal mediastinum. Lymph node measuring 1.6 cm short axis dimension is seen on series 5, image 21. Additional enlarged lymph node is seen within the more inferior right paratracheal mediastinum measuring 1.9 cm short axis dimension on series 5, image 27. These lymph nodes measured 8 mm and 10 mm short axis dimension respectively on CT of 07/19/2015. Esophagus appears normal. Trachea and central bronchi are unremarkable. Lungs/Pleura: Scattered ground-glass opacities bilaterally, perihilar predominant, most likely pulmonary edema. No pleural effusion or pneumothorax seen. Mild atelectasis at the left lung base. Upper Abdomen: No acute abnormality. Musculoskeletal: No chest wall abnormality. No acute or significant osseous findings. Review of the MIP images confirms the above findings. IMPRESSION: 1. No pulmonary embolism seen, with study limitations detailed above. The most peripheral segmental and subsegmental pulmonary arteries cannot be definitively characterized due to patient breathing motion artifact. No central obstructing pulmonary embolism identified. 2. Scattered ground-glass opacities bilaterally, perihilar predominant, most likely pulmonary edema related to CHF/volume overload. Differential for ground-glass opacities also includes atypical pneumonias such as viral or fungal,  interstitial pneumonias, chronic interstitial diseases, hypersensitivity pneumonitis, and respiratory bronchiolitis. 3. Cardiomegaly. 4. **An incidental finding of potential clinical significance has been found. Interval enlargement of 2 lymph nodes in the right paratracheal mediastinum, measurements and positions given above, largest measuring 1.9 cm short axis dimension, both pathologic by CT size criteria. Neoplastic lymphadenopathy cannot be excluded. Consider further characterization with nonemergent PET-CT. ** Electronically Signed   By: Bary RichardStan  Maynard M.D.   On: 05/16/2016 18:44   Dg Chest Port 1 View  Result Date: 05/16/2016 CLINICAL DATA:  Shortness of breath.  Left-sided chest pain. EXAM: PORTABLE CHEST 1 VIEW COMPARISON:  March 09, 2016 FINDINGS: Cardiomegaly. The hila and mediastinum are normal. Mild interstitial prominence. No focal infiltrate. IMPRESSION: Cardiomegaly. Mild interstitial prominence suggesting pulmonary venous congestion. No focal  infiltrate identified. Electronically Signed   By: Gerome Sam III M.D   On: 05/16/2016 18:45   Medications: Infusions: . cefTRIAXone (ROCEPHIN)  IV Stopped (05/17/16 2324)  . ferric gluconate (FERRLECIT/NULECIT) IV 125 mg (05/17/16 1938)    Scheduled Medications: . aspirin EC  81 mg Oral Daily  . atorvastatin  80 mg Oral q1800  . calcitRIOL  0.25 mcg Oral Q M,W,F-HD  . calcium acetate  1,334 mg Oral TID WC  . clopidogrel  75 mg Oral Daily  . cyclobenzaprine  10 mg Oral QHS  . darbepoetin (ARANESP) injection - DIALYSIS  100 mcg Intravenous Q Wed-HD  . gabapentin  800 mg Oral BID  . heparin  5,000 Units Subcutaneous Q8H  . hydrOXYzine  25 mg Oral QHS  . insulin aspart  0-5 Units Subcutaneous QHS  . insulin aspart  0-9 Units Subcutaneous TID WC  . insulin glargine  30 Units Subcutaneous Daily  . levothyroxine  50 mcg Oral QAC breakfast  . mouth rinse  15 mL Mouth Rinse BID  . metoCLOPramide  5 mg Oral BID  . metoprolol tartrate  50  mg Oral BID    have reviewed scheduled and prn medications.  Physical Exam: General: obese,  NAD Heart: HR 80s, reg Lungs: dec BS at bases Abdomen: obese, distended- PD cath in place Extremities: pitting edema Dialysis Access: left AVF    05/18/2016,7:56 AM  LOS: 1 day

## 2016-05-18 NOTE — Progress Notes (Signed)
[facility] Progress Note     Subjective: CC: PD cath removal Tolerating PO. Denies abdominal pain. Scheduled for HD today.   Objective: Vital signs in last 24 hours: Temp:  [97.7 F (36.5 C)-100.3 F (37.9 C)] 97.7 F (36.5 C) (05/17 0510) Pulse Rate:  [71-111] 82 (05/17 0510) Resp:  [16-18] 16 (05/17 0510) BP: (104-164)/(51-98) 104/64 (05/17 0510) SpO2:  [93 %-99 %] 96 % (05/17 0510) Weight:  [121.8 kg (268 lb 8 oz)-126.2 kg (278 lb 3.5 oz)] 121.8 kg (268 lb 8 oz) (05/17 0510) Last BM Date: 05/17/16  Intake/Output from previous day: 05/16 0701 - 05/17 0700 In: 340 [P.O.:290; IV Piggyback:50] Out: 4285 [Urine:325] Intake/Output this shift: No intake/output data recorded.  PE: Gen:  Alert, NAD, pleasant HEENT: PERRL, EOM's in tact  Card:  Regular rate and rhythm, pedal pulses 2+ BL Pulm:  Normal effort, clear to auscultation bilaterally Abd: Soft, non-tender, non-distended, bowel sounds present, PD site c/d/i without erythema or drainage. Skin: warm and dry, no rashes  Psych: A&Ox3   Lab Results:   Recent Labs  05/16/16 1740 05/17/16 1141  WBC 9.5 7.2  HGB 8.1* 8.1*  HCT 24.6* 25.8*  PLT 265 268   BMET  Recent Labs  05/17/16 1141 05/18/16 0357  NA 137 136  K 4.7 4.3  CL 99* 95*  CO2 23 30  GLUCOSE 278* 256*  BUN 59* 31*  CREATININE 8.64* 5.81*  CALCIUM 6.9* 7.4*   PT/INR  Recent Labs  05/16/16 2000  LABPROT 15.8*  INR 1.25   CMP     Component Value Date/Time   NA 136 05/18/2016 0357   NA 140 12/14/2014   K 4.3 05/18/2016 0357   CL 95 (L) 05/18/2016 0357   CO2 30 05/18/2016 0357   GLUCOSE 256 (H) 05/18/2016 0357   BUN 31 (H) 05/18/2016 0357   BUN 49 (A) 12/14/2014   CREATININE 5.81 (H) 05/18/2016 0357   CREATININE 1.56 (H) 05/26/2014 1946   CALCIUM 7.4 (L) 05/18/2016 0357   PROT 5.6 (L) 05/16/2016 1740   PROT 6.0 04/17/2016 0900   ALBUMIN 2.5 (L) 05/18/2016 0357   ALBUMIN 3.6 04/17/2016 0900   AST 16 05/16/2016 1740    ALT 14 (L) 05/16/2016 1740   ALKPHOS 79 05/16/2016 1740   BILITOT 0.6 05/16/2016 1740   BILITOT 0.2 04/17/2016 0900   GFRNONAA 11 (L) 05/18/2016 0357   GFRNONAA 54 (L) 05/26/2014 1946   GFRAA 12 (L) 05/18/2016 0357   GFRAA 62 05/26/2014 1946   Lipase     Component Value Date/Time   LIPASE 37 04/27/2016 1744   Studies/Results: Ct Angio Chest Pe W Or Wo Contrast  Result Date: 05/16/2016 CLINICAL DATA:  Shortness of breath, centralized chest pain radiating to the left, starting yesterday. Hypoxia. History of DVT, hypertension, CABG. EXAM: CT ANGIOGRAPHY CHEST WITH CONTRAST TECHNIQUE: Multidetector CT imaging of the chest was performed using the standard protocol during bolus administration of intravenous contrast. Multiplanar CT image reconstructions and MIPs were obtained to evaluate the vascular anatomy. CONTRAST:  100 cc Isovue 370 COMPARISON:  CTA chest dated 07/19/2015. FINDINGS: Cardiovascular: The peripheral segmental and subsegmental pulmonary arteries cannot be definitively characterized due to patient breathing motion artifact. There is no central obstructing pulmonary embolism identified within the main or central lobar pulmonary arteries bilaterally. No obstructing pulmonary embolism identified within the central segmental pulmonary arteries. No aortic aneurysm or dissection. There is mild cardiomegaly. No pericardial effusion. Coronary artery calcifications noted. Patient is status post  CABG. Mediastinum/Nodes: Enlarged lymph nodes noted within the right paratracheal mediastinum. Lymph node measuring 1.6 cm short axis dimension is seen on series 5, image 21. Additional enlarged lymph node is seen within the more inferior right paratracheal mediastinum measuring 1.9 cm short axis dimension on series 5, image 27. These lymph nodes measured 8 mm and 10 mm short axis dimension respectively on CT of 07/19/2015. Esophagus appears normal. Trachea and central bronchi are unremarkable.  Lungs/Pleura: Scattered ground-glass opacities bilaterally, perihilar predominant, most likely pulmonary edema. No pleural effusion or pneumothorax seen. Mild atelectasis at the left lung base. Upper Abdomen: No acute abnormality. Musculoskeletal: No chest wall abnormality. No acute or significant osseous findings. Review of the MIP images confirms the above findings. IMPRESSION: 1. No pulmonary embolism seen, with study limitations detailed above. The most peripheral segmental and subsegmental pulmonary arteries cannot be definitively characterized due to patient breathing motion artifact. No central obstructing pulmonary embolism identified. 2. Scattered ground-glass opacities bilaterally, perihilar predominant, most likely pulmonary edema related to CHF/volume overload. Differential for ground-glass opacities also includes atypical pneumonias such as viral or fungal, interstitial pneumonias, chronic interstitial diseases, hypersensitivity pneumonitis, and respiratory bronchiolitis. 3. Cardiomegaly. 4. **An incidental finding of potential clinical significance has been found. Interval enlargement of 2 lymph nodes in the right paratracheal mediastinum, measurements and positions given above, largest measuring 1.9 cm short axis dimension, both pathologic by CT size criteria. Neoplastic lymphadenopathy cannot be excluded. Consider further characterization with nonemergent PET-CT. ** Electronically Signed   By: Bary RichardStan  Maynard M.D.   On: 05/16/2016 18:44   Dg Chest Port 1 View  Result Date: 05/16/2016 CLINICAL DATA:  Shortness of breath.  Left-sided chest pain. EXAM: PORTABLE CHEST 1 VIEW COMPARISON:  March 09, 2016 FINDINGS: Cardiomegaly. The hila and mediastinum are normal. Mild interstitial prominence. No focal infiltrate. IMPRESSION: Cardiomegaly. Mild interstitial prominence suggesting pulmonary venous congestion. No focal infiltrate identified. Electronically Signed   By: Gerome Samavid  Williams III M.D   On:  05/16/2016 18:45   Anti-infectives: Anti-infectives    Start     Dose/Rate Route Frequency Ordered Stop   05/16/16 2000  cefTRIAXone (ROCEPHIN) 1 g in dextrose 5 % 50 mL IVPB     1 g 100 mL/hr over 30 Minutes Intravenous Every 24 hours 05/16/16 1947       Assessment/Plan ESRD on PD  - PD catheter is not functioning appropriately and patient transitioning to HD - plan for non-emergent removal of PD cath in OR tomorrow by Dr. Magnus IvanBlackman - NPO after MN, perioperative abx for surgical prophylaxis, transcribe pt consent     LOS: 1 day    Adam PhenixElizabeth S Lataunya Ruud , [facility] 05/18/2016, 7:34 AM Pager: [phone] Consults: [phone] Mon-Fri 7:00 am-4:30 pm Sat-Sun 7:00 am-11:30 am

## 2016-05-18 NOTE — Progress Notes (Addendum)
PROGRESS NOTE                                                                                                                                                                                                             Patient Demographics:    Russell [name], is a 46 y.o. male, DOB - 12/07/1970, ZOX:[id number]  Admit date - 05/16/2016   Admitting Physician Lorretta HarpXilin Niu, MD  Outpatient Primary MD for the patient is Shade FloodGreene, Jeffrey R, MD  LOS - 1  Outpatient Specialists:Nephrology  Chief Complaint  Patient presents with  . Shortness of Breath       Brief Narrative   46 year old male with history of ESRD on peritoneal dialysis, insulin-dependent diabetes mellitus, hypertension, CAD history of CABG, asthma, depression, anxiety, right leg DVT (completed 6 months of anticoagulation) and hypothyroidism presented with shortness of breath with low-grade fever and chest discomfort. Patient found to be in acute on chronic respiratory failure secondary to fluid overload. This is likely in the setting of nonadherence to his peritoneal dialysis at home.   Subjective:   Reports no significant improvement in his dyspnea. Denies chest pain.   Assessment  & Plan :    Principal Problem: Acute on chronic respiratory failure with hypoxia (HCC) -Due to acute pneumonia overload from failed outpatient peritoneal dialysis (patient noncompliant) -Symptoms improving with hemodialysis last 2 days. 2-D echo with EF of 55%, no wall motion abnormality, shows grade 2 diastolic dysfunction. Moderate pulmonary hypertension. -CT angio negative for PE.    Active Problems:  Low-grade fever (Tamx 100.24F) Unexplained. Culture so far negative. Check UA. On empiric Rocephin. PD-catheter to be removed tomorrow.     Chest pain Secondary to volume overload. Resolved with dialysis. 2-D echo reviewed.    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 Poorly controlled due to nonadherence to medications. Resume home dose Lantus and sliding scale coverage.    ESRD on dialysis [facility](HCC) Failed peritoneal dialysis due to nonadherence. Now switching to hemodialysis. Awaiting to be CLIPed. Surgery will remove peritoneal dialysis catheter tomorrow. Continue calcitriol and PhosLo.      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   Anemia due to chronic kidney disease/iron  deficiency anemia  Stable at baseline.  Enlarged lymph node on CT chest Incidental finding with enlarged lymph node in the right paratracheal mediastinum. Recommend outpatient PET-CT.     Essential hypertension, benign Resume home medications    Hypothyroidism (acquired) Continue Synthroid.    Code Status : Full code  Family Communication  : None at bedside  Disposition Plan  : Home once outpatient dialysis setup  Barriers For Discharge : Active symptoms  Consults  :  Nephrology [place] surgery  Procedures  :  CT angiogram of the chest 2-D echo  DVT Prophylaxis  :  Subcutaneous heparin  Lab Results  Component Value Date   PLT 268 05/17/2016    Antibiotics  :   Anti-infectives    Start     Dose/Rate Route Frequency Ordered Stop   05/16/16 2000  cefTRIAXone (ROCEPHIN) 1 g in dextrose 5 % 50 mL IVPB     1 g 100 mL/hr over 30 Minutes Intravenous Every 24 hours 05/16/16 1947          Objective:   Vitals:   05/17/16 2109 05/17/16 2246 05/18/16 0132 05/18/16 0510  BP: (!) 162/80 (!) 129/51 138/75 104/64  Pulse: (!) 104 (!) 111 100 82  Resp: 18 18 18 16   Temp: 98.7 F (37.1 C) 100.3 F (37.9 C) 100.2 F (37.9 C) 97.7 F (36.5 C)  TempSrc: Oral Oral Oral Oral  SpO2: 99% 96% 95% 96%  Weight:    121.8 kg (268 lb 8 oz)  Height:        Wt Readings from  Last 3 Encounters:  05/18/16 121.8 kg (268 lb 8 oz)  04/28/16 125.8 kg (277 lb 5.4 oz)  04/27/16 123.4 kg (272 lb)     Intake/Output Summary (Last 24 hours) at 05/18/16 1251 Last data filed at 05/18/16 0900  Gross per 24 hour  Intake              340 ml  Output             3960 ml  Net            -3620 ml     Physical Exam Gen.: Middle-aged male not in distress HEENT: Moist mucosa, supple neck Chest: Clear breath sounds bilaterally CVS: Normal S1 and S2, no murmurs GI: Soft, nondistended, nontender, peritoneal dialysis catheter Musculoskeletal: Warm, no edema     Data Review:    CBC  Recent Labs Lab 05/16/16 1740 05/17/16 1141  WBC 9.5 7.2  HGB 8.1* 8.1*  HCT 24.6* 25.8*  PLT 265 268  MCV 86.3 86.9  MCH 28.4 27.3  MCHC 32.9 31.4  RDW 15.7* 15.8*  LYMPHSABS 1.0  --   MONOABS 0.5  --   EOSABS 0.2  --   BASOSABS 0.0  --     Chemistries   Recent Labs Lab 05/16/16 1740 05/17/16 1141 05/18/16 0357  NA 139 137 136  K 5.0 4.7 4.3  CL 104 99* 95*  CO2 18* 23 30  GLUCOSE 164* 278* 256*  BUN 99* 59* 31*  CREATININE 12.03* 8.64* 5.81*  CALCIUM 6.1* 6.9* 7.4*  AST 16  --   --   ALT 14*  --   --   ALKPHOS 79  --   --   BILITOT 0.6  --   --    ------------------------------------------------------------------------------------------------------------------  Recent Labs  05/17/16 0208  CHOL 196  HDL 32*  LDLCALC 115*  TRIG 246*  CHOLHDL 6.1    Lab  Results  Component Value Date   HGBA1C 12.8 (H) 05/17/2016   ------------------------------------------------------------------------------------------------------------------ No results for input(s): TSH, T4TOTAL, T3FREE, THYROIDAB in the last 72 hours.  Invalid input(s): FREET3 ------------------------------------------------------------------------------------------------------------------  Recent Labs  05/17/16 0711  FERRITIN 139  TIBC 216*  IRON 17*    Coagulation profile  Recent  Labs Lab 05/16/16 2000  INR 1.25    No results for input(s): DDIMER in the last 72 hours.  Cardiac Enzymes  Recent Labs Lab 05/16/16 2000 05/17/16 0208 05/17/16 0711  TROPONINI 0.03* 0.04* 0.04*   ------------------------------------------------------------------------------------------------------------------    Component Value Date/Time   BNP 662.2 (H) 05/16/2016 1740   BNP 41.7 05/05/2014 0904    Inpatient Medications  Scheduled Meds: . aspirin EC  81 mg Oral Daily  . atorvastatin  80 mg Oral q1800  . calcitRIOL  0.25 mcg Oral Q M,W,F-HD  . calcium acetate  1,334 mg Oral TID WC  . clopidogrel  75 mg Oral Daily  . cyclobenzaprine  10 mg Oral QHS  . darbepoetin (ARANESP) injection - DIALYSIS  100 mcg Intravenous Q Wed-HD  . gabapentin  800 mg Oral BID  . heparin  5,000 Units Subcutaneous Q8H  . hydrOXYzine  25 mg Oral QHS  . insulin aspart  0-5 Units Subcutaneous QHS  . insulin aspart  0-9 Units Subcutaneous TID WC  . insulin glargine  30 Units Subcutaneous BID  . levothyroxine  50 mcg Oral QAC breakfast  . mouth rinse  15 mL Mouth Rinse BID  . metoCLOPramide  5 mg Oral BID  . metoprolol tartrate  50 mg Oral BID   Continuous Infusions: . cefTRIAXone (ROCEPHIN)  IV Stopped (05/17/16 2324)  . ferric gluconate (FERRLECIT/NULECIT) IV 125 mg (05/17/16 1938)   PRN Meds:.acetaminophen, albuterol, clonazePAM, hydrALAZINE, morphine injection, nitroGLYCERIN, ondansetron (ZOFRAN) IV, zolpidem  Micro Results Recent Results (from the past 240 hour(s))  Urine culture     Status: None   Collection Time: 05/16/16  7:44 PM  Result Value Ref Range Status   Specimen Description URINE, RANDOM  Final   Special Requests NONE  Final   Culture NO GROWTH  Final   Report Status 05/18/2016 FINAL  Final  Culture, blood (x 2)     Status: None (Preliminary result)   Collection Time: 05/16/16  8:00 PM  Result Value Ref Range Status   Specimen Description BLOOD RIGHT ANTECUBITAL   Final   Special Requests   Final    BOTTLES DRAWN AEROBIC AND ANAEROBIC Blood Culture adequate volume   Culture NO GROWTH 2 DAYS  Final   Report Status PENDING  Incomplete  Culture, blood (x 2)     Status: None (Preliminary result)   Collection Time: 05/16/16  8:32 PM  Result Value Ref Range Status   Specimen Description BLOOD RIGHT HAND  Final   Special Requests   Final    BOTTLES DRAWN AEROBIC AND ANAEROBIC Blood Culture adequate volume   Culture NO GROWTH 2 DAYS  Final   Report Status PENDING  Incomplete  MRSA PCR Screening     Status: None   Collection Time: 05/17/16  2:08 AM  Result Value Ref Range Status   MRSA by PCR NEGATIVE NEGATIVE Final    Comment:        The GeneXpert MRSA Assay (FDA approved for NASAL specimens only), is one component of a comprehensive MRSA colonization surveillance program. It is not intended to diagnose MRSA infection nor to guide or monitor treatment for MRSA infections.  Radiology Reports Ct Abdomen Pelvis Wo Contrast  Result Date: 04/27/2016 CLINICAL DATA:  46 year old male with abdominal and pelvic pain with nausea for 3 days. EXAM: CT ABDOMEN AND PELVIS WITHOUT CONTRAST TECHNIQUE: Multidetector CT imaging of the abdomen and pelvis was performed following the standard protocol without IV contrast. COMPARISON:  None. FINDINGS: Please note that parenchymal abnormalities may be missed without intravenous contrast. Lower chest: No acute abnormality Hepatobiliary: The liver and gallbladder are unremarkable. There is no evidence of biliary dilatation. 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 There is no evidence of hydronephrosis or urinary calculi. The adrenal glands and bladder are unremarkable. Stomach/Bowel: A moderate amount of pneumoperitoneum throughout the mid and upper abdomen identified. A peritoneal dialysis catheter is noted. No bowel abnormalities are identified. No bowel  wall thickening or bowel obstruction noted. There is no evidence of inflammatory changes around any bowel loops. Vascular/Lymphatic: Aortic atherosclerotic calcification noted without aneurysm. No enlarged lymph nodes identified. Reproductive: Prostate is unremarkable. Other: No ascites or abscess. Musculoskeletal: No acute or significant osseous findings. IMPRESSION: Moderate amount of pneumoperitoneum within the mid and upper abdomen. No bowel abnormality or inflammation is identified and pneumoperitoneum is most likely related to air from the patient's peritoneal dialysis. Correlate clinically and consider follow-up as bowel perforation it is difficult to entirely exclude. Abdominal aortic atherosclerosis. Horseshoe kidney. Electronically Signed   By: Harmon Pier M.D.   On: 04/27/2016 18:24   Dg Abd 1 View  Result Date: 04/28/2016 CLINICAL DATA:  Abdominal pain x2 days EXAM: ABDOMEN - 1 VIEW COMPARISON:  Same day CT FINDINGS: Peritoneal dialysis catheter traverses the left hemiabdomen with tip terminating in the pelvis. Visualization of both sides of bowel wall involving a segment of bowel in the left hemiabdomen would be consistent with pneumoperitoneum at noted on same day CT possibly related to the patient's peritoneal dialysis. No bowel obstruction is seen. Short term interval surveillance and follow-up radiographs or CT is recommended if there is clinical concern for bowel perforation. No radio-opaque calculi nor acute osseous abnormality. Median sternotomy sutures are present. IMPRESSION: Subtle changes of pneumoperitoneum possibly related to peritoneal dialysis. No bowel obstruction noted. No significant accumulation of free air is apparent radiographically. Short-term interval follow-up radiographs or repeat CT may help to assure stability. Electronically Signed   By: Tollie Eth M.D.   On: 04/28/2016 00:01   Ct Angio Chest Pe W Or Wo Contrast  Result Date: 05/16/2016 CLINICAL DATA:  Shortness of  breath, centralized chest pain radiating to the left, starting yesterday. Hypoxia. History of DVT, hypertension, CABG. EXAM: CT ANGIOGRAPHY CHEST WITH CONTRAST TECHNIQUE: Multidetector CT imaging of the chest was performed using the standard protocol during bolus administration of intravenous contrast. Multiplanar CT image reconstructions and MIPs were obtained to evaluate the vascular anatomy. CONTRAST:  100 cc Isovue 370 COMPARISON:  CTA chest dated 07/19/2015. FINDINGS: Cardiovascular: The peripheral segmental and subsegmental pulmonary arteries cannot be definitively characterized due to patient breathing motion artifact. There is no central obstructing pulmonary embolism identified within the main or central lobar pulmonary arteries bilaterally. No obstructing pulmonary embolism identified within the central segmental pulmonary arteries. No aortic aneurysm or dissection. There is mild cardiomegaly. No pericardial effusion. Coronary artery calcifications noted. Patient is status post CABG. Mediastinum/Nodes: Enlarged lymph nodes noted within the right paratracheal mediastinum. Lymph node measuring 1.6 cm short axis dimension is seen on series 5, image 21. Additional enlarged lymph node is seen within the more inferior right paratracheal  mediastinum measuring 1.9 cm short axis dimension on series 5, image 27. These lymph nodes measured 8 mm and 10 mm short axis dimension respectively on CT of 07/19/2015. Esophagus appears normal. Trachea and central bronchi are unremarkable. Lungs/Pleura: Scattered ground-glass opacities bilaterally, perihilar predominant, most likely pulmonary edema. No pleural effusion or pneumothorax seen. Mild atelectasis at the left lung base. Upper Abdomen: No acute abnormality. Musculoskeletal: No chest wall abnormality. No acute or significant osseous findings. Review of the MIP images confirms the above findings. IMPRESSION: 1. No pulmonary embolism seen, with study limitations detailed  above. The most peripheral segmental and subsegmental pulmonary arteries cannot be definitively characterized due to patient breathing motion artifact. No central obstructing pulmonary embolism identified. 2. Scattered ground-glass opacities bilaterally, perihilar predominant, most likely pulmonary edema related to CHF/volume overload. Differential for ground-glass opacities also includes atypical pneumonias such as viral or fungal, interstitial pneumonias, chronic interstitial diseases, hypersensitivity pneumonitis, and respiratory bronchiolitis. 3. Cardiomegaly. 4. **An incidental finding of potential clinical significance has been found. Interval enlargement of 2 lymph nodes in the right paratracheal mediastinum, measurements and positions given above, largest measuring 1.9 cm short axis dimension, both pathologic by CT size criteria. Neoplastic lymphadenopathy cannot be excluded. Consider further characterization with nonemergent PET-CT. ** Electronically Signed   By: Bary Richard M.D.   On: 05/16/2016 18:44   Dg Chest Port 1 View  Result Date: 05/16/2016 CLINICAL DATA:  Shortness of breath.  Left-sided chest pain. EXAM: PORTABLE CHEST 1 VIEW COMPARISON:  March 09, 2016 FINDINGS: Cardiomegaly. The hila and mediastinum are normal. Mild interstitial prominence. No focal infiltrate. IMPRESSION: Cardiomegaly. Mild interstitial prominence suggesting pulmonary venous congestion. No focal infiltrate identified. Electronically Signed   By: Gerome Sam III M.D   On: 05/16/2016 18:45    Time Spent in minutes  25   Eddie North M.D on 05/18/2016 at 12:51 PM  Between 7am to 7pm - Pager - [phone]  After 7pm go to [email or web address] - password [facility]  [facility] -  Office  [phone]

## 2016-05-18 NOTE — Progress Notes (Signed)
Inpatient Diabetes Program Recommendations  AACE/ADA: New Consensus Statement on Inpatient Glycemic Control (2015)  Target Ranges:  Prepandial:   less than 140 mg/dL      Peak postprandial:   less than 180 mg/dL (1-2 hours)      Critically ill patients:  140 - 180 mg/dL   Lab Results  Component Value Date   GLUCAP 310 (H) 05/18/2016   HGBA1C 12.8 (H) 05/17/2016    Review of Glycemic Control Results for Russell [name], Mir B (MRN [id number]) as of 05/18/2016 10:17  Ref. Range 05/17/2016 08:01 05/17/2016 11:56 05/17/2016 22:52 05/18/2016 07:59  Glucose-Capillary Latest Ref Range: 65 - 99 mg/dL 119144 (H) 147266 (H) 829228 (H) 310 (H)   Diabetes history: DM2 Outpatient Diabetes medications: Lantus 40 units bid + Novolog 5-10 units tid meal coverage Current orders for Inpatient glycemic control: Lantus 30 units daily + Novolog correction 0-9 units tid + 0-5 units hs  Inpatient Diabetes Program Recommendations:  Noted A1c 12.5 from previous admission 09/28/15 and now 12.8. Please consider: -Increase Lantus to 30 units bid -Novolog 5 units tid if eats 50%  Thank you, Darel HongJudy E. Veora Fonte, RN, MSN, CDE  Diabetes Coordinator Inpatient Glycemic Control Team Team Pager [phone] (8am-5pm) 05/18/2016 10:33 AM

## 2016-05-18 NOTE — Progress Notes (Signed)
Pt come back from HD. VS stable. Patient reported headache. PRN medication given. No other complaints.  Clarise Chacko, RN

## 2016-05-19 ENCOUNTER — Encounter (HOSPITAL_COMMUNITY)
Admission: EM | Disposition: A | Discharge status: Home / Self Care | Payer: Self-pay | Source: Home / Self Care | Attending: Internal Medicine

## 2016-05-19 ENCOUNTER — Inpatient Hospital Stay (HOSPITAL_COMMUNITY): Payer: Medicare Other | Admitting: Anesthesiology

## 2016-05-19 DIAGNOSIS — Z992 Dependence on renal dialysis: Secondary | ICD-10-CM

## 2016-05-19 DIAGNOSIS — D631 Anemia in chronic kidney disease: Secondary | ICD-10-CM

## 2016-05-19 HISTORY — PX: CAPD REMOVAL: SHX5234

## 2016-05-19 LAB — GLUCOSE, CAPILLARY
GLUCOSE-CAPILLARY: 149 mg/dL — AB (ref 65–99)
GLUCOSE-CAPILLARY: 239 mg/dL — AB (ref 65–99)
GLUCOSE-CAPILLARY: 186 mg/dL — AB (ref 65–99)
Glucose-Capillary: 196 mg/dL — ABNORMAL HIGH (ref 65–99)
Glucose-Capillary: 176 mg/dL — ABNORMAL HIGH (ref 65–99)
Glucose-Capillary: 160 mg/dL — ABNORMAL HIGH (ref 65–99)

## 2016-05-19 LAB — CBC
HEMATOCRIT: 30.4 % — AB (ref 39.0–52.0)
HEMOGLOBIN: 9.5 g/dL — AB (ref 13.0–17.0)
MCH: 27.5 pg (ref 26.0–34.0)
MCHC: 31.3 g/dL (ref 30.0–36.0)
MCV: 88.1 fL (ref 78.0–100.0)
Platelets: 324 10*3/uL (ref 150–400)
RBC: 3.45 MIL/uL — AB (ref 4.22–5.81)
RDW: 16 % — ABNORMAL HIGH (ref 11.5–15.5)
WBC: 7.8 10*3/uL (ref 4.0–10.5)

## 2016-05-19 LAB — RENAL FUNCTION PANEL
ANION GAP: 11 (ref 5–15)
Albumin: 2.7 g/dL — ABNORMAL LOW (ref 3.5–5.0)
BUN: 23 mg/dL — ABNORMAL HIGH (ref 6–20)
CO2: 28 mmol/L (ref 22–32)
Calcium: 7.8 mg/dL — ABNORMAL LOW (ref 8.9–10.3)
Chloride: 97 mmol/L — ABNORMAL LOW (ref 101–111)
Creatinine, Ser: 5.34 mg/dL — ABNORMAL HIGH (ref 0.61–1.24)
GFR calc non Af Amer: 12 mL/min — ABNORMAL LOW (ref >60)
GFR, EST AFRICAN AMERICAN: 14 mL/min — AB (ref >60)
GLUCOSE: 232 mg/dL — AB (ref 65–99)
POTASSIUM: 4.2 mmol/L (ref 3.5–5.1)
Phosphorus: 5.6 mg/dL — ABNORMAL HIGH (ref 2.5–4.6)
SODIUM: 136 mmol/L (ref 135–145)

## 2016-05-19 LAB — SURGICAL PCR SCREEN
MRSA, PCR: NEGATIVE
Staphylococcus aureus: NEGATIVE

## 2016-05-19 SURGERY — CONTINUOUS AMBULATORY PERITONEAL DIALYSIS  (CAPD) CATHETER REMOVAL
Anesthesia: General | Site: Abdomen

## 2016-05-19 MED ORDER — FENTANYL CITRATE (PF) 250 MCG/5ML IJ SOLN
INTRAMUSCULAR | Status: AC
  Filled 2016-05-19: qty 5

## 2016-05-19 MED ORDER — CEFAZOLIN SODIUM-DEXTROSE 2-3 GM-% IV SOLR
INTRAVENOUS | Status: DC | PRN
  Administered 2016-05-19: 2 g via INTRAVENOUS

## 2016-05-19 MED ORDER — EPHEDRINE SULFATE 50 MG/ML IJ SOLN
INTRAMUSCULAR | Status: DC | PRN
  Administered 2016-05-19 (×3): 5 mg via INTRAVENOUS

## 2016-05-19 MED ORDER — LIDOCAINE HCL (CARDIAC) 20 MG/ML IV SOLN
INTRAVENOUS | Status: DC | PRN
  Administered 2016-05-19: 60 mg via INTRAVENOUS

## 2016-05-19 MED ORDER — SODIUM CHLORIDE 0.9 % IV SOLN
INTRAVENOUS | Status: DC
  Administered 2016-05-19 (×2): via INTRAVENOUS

## 2016-05-19 MED ORDER — HYDROMORPHONE HCL 1 MG/ML IJ SOLN: 0.2500 mg | INTRAMUSCULAR | Status: DC | PRN

## 2016-05-19 MED ORDER — PHENYLEPHRINE HCL 10 MG/ML IJ SOLN
INTRAMUSCULAR | Status: DC | PRN
  Administered 2016-05-19 (×2): 80 ug via INTRAVENOUS
  Administered 2016-05-19: 120 ug via INTRAVENOUS
  Administered 2016-05-19: 40 ug via INTRAVENOUS

## 2016-05-19 MED ORDER — BUPIVACAINE HCL (PF) 0.25 % IJ SOLN
INTRAMUSCULAR | Status: AC
  Filled 2016-05-19: qty 30

## 2016-05-19 MED ORDER — FENTANYL CITRATE (PF) 100 MCG/2ML IJ SOLN
INTRAMUSCULAR | Status: DC | PRN
  Administered 2016-05-19: 100 ug via INTRAVENOUS

## 2016-05-19 MED ORDER — INSULIN GLARGINE 100 UNIT/ML ~~LOC~~ SOLN
40.0000 [IU] | Freq: Two times a day (BID) | SUBCUTANEOUS | Status: DC
  Administered 2016-05-19 – 2016-05-20 (×2): 40 [IU] via SUBCUTANEOUS
  Filled 2016-05-19 (×2): qty 0.4

## 2016-05-19 MED ORDER — BUPIVACAINE HCL (PF) 0.25 % IJ SOLN
INTRAMUSCULAR | Status: DC | PRN
  Administered 2016-05-19: 20 mL

## 2016-05-19 MED ORDER — PROPOFOL 10 MG/ML IV BOLUS
INTRAVENOUS | Status: DC | PRN
  Administered 2016-05-19: 130 mg via INTRAVENOUS

## 2016-05-19 MED ORDER — 0.9 % SODIUM CHLORIDE (POUR BTL) OPTIME
TOPICAL | Status: DC | PRN
  Administered 2016-05-19: 1000 mL

## 2016-05-19 MED ORDER — MIDAZOLAM HCL 2 MG/2ML IJ SOLN
INTRAMUSCULAR | Status: AC
  Filled 2016-05-19: qty 2

## 2016-05-19 MED ORDER — MIDAZOLAM HCL 5 MG/5ML IJ SOLN
INTRAMUSCULAR | Status: DC | PRN
  Administered 2016-05-19: 2 mg via INTRAVENOUS

## 2016-05-19 MED ORDER — SUCCINYLCHOLINE CHLORIDE 20 MG/ML IJ SOLN
INTRAMUSCULAR | Status: DC | PRN
  Administered 2016-05-19: 100 mg via INTRAVENOUS

## 2016-05-19 SURGICAL SUPPLY — 42 items
ADH SKN CLS APL DERMABOND .7 (GAUZE/BANDAGES/DRESSINGS) ×1
BLADE CLIPPER SURG (BLADE) IMPLANT
BLADE SURG 15 STRL LF DISP TIS (BLADE) ×1 IMPLANT
BLADE SURG 15 STRL SS (BLADE) ×3
CANISTER SUCT 3000ML PPV (MISCELLANEOUS) IMPLANT
COVER SURGICAL LIGHT HANDLE (MISCELLANEOUS) ×3 IMPLANT
DERMABOND ADVANCED (GAUZE/BANDAGES/DRESSINGS) ×2
DERMABOND ADVANCED .7 DNX12 (GAUZE/BANDAGES/DRESSINGS) ×1 IMPLANT
DRAPE LAPAROTOMY T 102X78X121 (DRAPES) ×3 IMPLANT
ELECT CAUTERY BLADE 6.4 (BLADE) ×3 IMPLANT
ELECT REM PT RETURN 9FT ADLT (ELECTROSURGICAL) ×3
ELECTRODE REM PT RTRN 9FT ADLT (ELECTROSURGICAL) ×1 IMPLANT
GAUZE SPONGE 4X4 16PLY XRAY LF (GAUZE/BANDAGES/DRESSINGS) ×3 IMPLANT
GLOVE BIO SURGEON STRL SZ 6.5 (GLOVE) ×1 IMPLANT
GLOVE BIO SURGEON STRL SZ7.5 (GLOVE) ×3 IMPLANT
GLOVE BIO SURGEONS STRL SZ 6.5 (GLOVE) ×1
GLOVE BIOGEL PI IND STRL 8 (GLOVE) ×1 IMPLANT
GLOVE BIOGEL PI INDICATOR 8 (GLOVE) ×2
GLOVE SURG SIGNA 7.5 PF LTX (GLOVE) ×2 IMPLANT
GOWN STRL REUS W/ TWL LRG LVL3 (GOWN DISPOSABLE) ×1 IMPLANT
GOWN STRL REUS W/ TWL XL LVL3 (GOWN DISPOSABLE) ×1 IMPLANT
GOWN STRL REUS W/TWL LRG LVL3 (GOWN DISPOSABLE) ×6
GOWN STRL REUS W/TWL XL LVL3 (GOWN DISPOSABLE) ×3
KIT BASIN OR (CUSTOM PROCEDURE TRAY) ×3 IMPLANT
KIT ROOM TURNOVER OR (KITS) ×3 IMPLANT
NDL HYPO 25GX1X1/2 BEV (NEEDLE) ×1 IMPLANT
NEEDLE HYPO 25GX1X1/2 BEV (NEEDLE) ×3 IMPLANT
NS IRRIG 1000ML POUR BTL (IV SOLUTION) ×3 IMPLANT
PACK SURGICAL SETUP 50X90 (CUSTOM PROCEDURE TRAY) ×3 IMPLANT
PAD ARMBOARD 7.5X6 YLW CONV (MISCELLANEOUS) ×6 IMPLANT
PENCIL BUTTON HOLSTER BLD 10FT (ELECTRODE) ×3 IMPLANT
SUT MNCRL AB 3-0 PS2 18 (SUTURE) ×3 IMPLANT
SUT VIC AB 3-0 SH 27 (SUTURE) ×3
SUT VIC AB 3-0 SH 27X BRD (SUTURE) IMPLANT
SUT VICRYL 0 UR6 27IN ABS (SUTURE) ×3 IMPLANT
SYR BULB 3OZ (MISCELLANEOUS) ×3 IMPLANT
SYR CONTROL 10ML LL (SYRINGE) ×3 IMPLANT
TOWEL OR 17X24 6PK STRL BLUE (TOWEL DISPOSABLE) ×3 IMPLANT
TOWEL OR 17X26 10 PK STRL BLUE (TOWEL DISPOSABLE) ×3 IMPLANT
TUBE CONNECTING 12'X1/4 (SUCTIONS)
TUBE CONNECTING 12X1/4 (SUCTIONS) IMPLANT
YANKAUER SUCT BULB TIP NO VENT (SUCTIONS) ×2 IMPLANT

## 2016-05-19 NOTE — Anesthesia Preprocedure Evaluation (Addendum)
Anesthesia Evaluation  Patient identified by MRN, date of birth, ID band Patient awake    Reviewed: Allergy & Precautions, H&P , NPO status , Patient's Chart, lab work & pertinent test results  Airway Mallampati: III  TM Distance: >3 FB Neck ROM: Full    Dental no notable dental hx. (+) Teeth Intact, Dental Advisory Given   Pulmonary asthma , former smoker,    Pulmonary exam normal breath sounds clear to auscultation       Cardiovascular hypertension, Pt. on medications and Pt. on home beta blockers + CAD, + Past MI and + CABG   Rhythm:Regular Rate:Normal     Neuro/Psych  Headaches, Anxiety Depression    GI/Hepatic negative GI ROS, Neg liver ROS,   Endo/Other  diabetes, Insulin DependentHypothyroidism   Renal/GU Renal disease  negative genitourinary   Musculoskeletal   Abdominal   Peds  Hematology negative hematology ROS (+) anemia ,   Anesthesia Other Findings   Reproductive/Obstetrics negative OB ROS                            Anesthesia Physical Anesthesia Plan  ASA: III  Anesthesia Plan: General   Post-op Pain Management:    Induction: Intravenous  Airway Management Planned: Oral ETT  Additional Equipment:   Intra-op Plan:   Post-operative Plan: Extubation in OR  Informed Consent: I have reviewed the patients History and Physical, chart, labs and discussed the procedure including the risks, benefits and alternatives for the proposed anesthesia with the patient or authorized representative who has indicated his/her understanding and acceptance.   Dental advisory given  Plan Discussed with: CRNA  Anesthesia Plan Comments:         Anesthesia Quick Evaluation

## 2016-05-19 NOTE — Anesthesia Postprocedure Evaluation (Signed)
Anesthesia Post Note  Patient: Russell [name]  Procedure(s) Performed: Procedure(s) (LRB): PD CATH REMOVAL (N/A)  Patient location during evaluation: PACU Anesthesia Type: General Level of consciousness: awake and alert Pain management: pain level controlled Vital Signs Assessment: post-procedure vital signs reviewed and stable Respiratory status: spontaneous breathing, nonlabored ventilation and respiratory function stable Cardiovascular status: blood pressure returned to baseline and stable Postop Assessment: no signs of nausea or vomiting Anesthetic complications: no       Last Vitals:  Vitals:   05/19/16 1210 05/19/16 1217  BP: (!) 86/68 139/86  Pulse: 85 86  Resp: 15 15  Temp:  36.8 C    Last Pain:  Vitals:   05/19/16 0553  TempSrc: Oral  PainSc:                  Travares Nelles,W. EDMOND

## 2016-05-19 NOTE — Progress Notes (Signed)
PROGRESS NOTE                                                                                                                                                                                                             Patient Demographics:    Russell [name], is a 46 y.o. male, DOB - 1970/01/15, ZOX:[id number]  Admit date - 05/16/2016   Admitting Physician Lorretta Harp, MD  Outpatient Primary MD for the patient is Shade Flood, MD  LOS - 2  Outpatient Specialists:Nephrology  Chief Complaint  Patient presents with  . Shortness of Breath       Brief Narrative   46 year old male with history of ESRD on peritoneal dialysis, insulin-dependent diabetes mellitus, hypertension, CAD history of CABG, asthma, depression, anxiety, right leg DVT (completed 6 months of anticoagulation) and hypothyroidism presented with shortness of breath with low-grade fever and chest discomfort. Patient found to be in acute on chronic respiratory failure secondary to fluid overload. This is likely in the setting of nonadherence to his peritoneal dialysis at home.   Subjective:   Reports no significant improvement in his dyspnea. Denies chest pain.   Assessment  & Plan :    Principal Problem: Acute on chronic respiratory failure with hypoxia (HCC) -Due to acute Volume overload from failed outpatient peritoneal dialysis (patient noncompliant) -Symptoms improving with hemodialysis. 2-D echo with EF of 55%, no wall motion abnormality, shows grade 2 diastolic dysfunction. Moderate pulmonary hypertension. -CT angio negative for PE.    Active Problems:  Low-grade fever  Unexplained. Remains afebrile past 24 hours. UA unremarkable. Blood cultures negative. On empiric Rocephin. (cover for 5 days). PD-catheter Removed today.    Chest pain Secondary to volume overload. Resolved with dialysis. 2-D echo reviewed.    Uncontrolled diabetes mellitus  type 2 with peripheral artery disease (HCC) A1c of 12.8. Poorly controlled due to nonadherence to medications. CBG in 200s. Resume home dose Lantus and sliding scale coverage.    ESRD on dialysis [facility]) Failed peritoneal dialysis due to nonadherence. Now switching to hemodialysis. Awaiting to be CLIPed.  peritoneal dialysis catheter removed today. Continue calcitriol and PhosLo.      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   Anemia due to  chronic kidney disease/iron deficiency anemia  Stable at baseline.  Enlarged lymph node on CT chest Incidental finding with enlarged lymph node in the right paratracheal mediastinum. Recommend outpatient PET-CT.     Essential hypertension, benign Resume home medications    Hypothyroidism (acquired) Continue Synthroid.    Code Status : Full code  Family Communication  : None at bedside  Disposition Plan  : Home once outpatient dialysis setup  Barriers For Discharge : pending CLIP   Consults  :  Nephrology [place] [facility]  Procedures  :  CT angiogram of the chest 2-D echo  DVT Prophylaxis  :  Subcutaneous heparin  Lab Results  Component Value Date   PLT 268 05/17/2016    Antibiotics  :   Anti-infectives    Start     Dose/Rate Route Frequency Ordered Stop   05/16/16 2000  cefTRIAXone (ROCEPHIN) 1 g in dextrose 5 % 50 mL IVPB     1 g 100 mL/hr over 30 Minutes Intravenous Every 24 hours 05/16/16 1947          Objective:   Vitals:   05/19/16 0553 05/19/16 1154 05/19/16 1210 05/19/16 1217  BP: 119/71 (!) 139/97 (!) 86/68 139/86  Pulse: 91 87 85 86  Resp: 16 15 15 15   Temp: 98.7 F (37.1 C) 98.2 F (36.8 C)  98.2 F (36.8 C)  TempSrc: Oral     SpO2: 94% 100% 94% 98%  Weight: 120.2 kg (265 lb 1.6 oz)     Height:        Wt Readings from Last 3 Encounters:    05/19/16 120.2 kg (265 lb 1.6 oz)  04/28/16 125.8 kg (277 lb 5.4 oz)  04/27/16 123.4 kg (272 lb)     Intake/Output Summary (Last 24 hours) at 05/19/16 1523 Last data filed at 05/19/16 1156  Gross per 24 hour  Intake             1220 ml  Output             3525 ml  Net            -2305 ml     Physical Exam Gen.: Middle aged male not in distress HEENT: Moist mucosa, supple neck Chest: Clear bilaterally CVS: Normal S1 and S2 GI: Soft, nontender, peritoneal dialysis catheter removed (site appears clean) nondistended Musculoskeletal: Warm, no edema      Data Review:    CBC  Recent Labs Lab 05/16/16 1740 05/17/16 1141  WBC 9.5 7.2  HGB 8.1* 8.1*  HCT 24.6* 25.8*  PLT 265 268  MCV 86.3 86.9  MCH 28.4 27.3  MCHC 32.9 31.4  RDW 15.7* 15.8*  LYMPHSABS 1.0  --   MONOABS 0.5  --   EOSABS 0.2  --   BASOSABS 0.0  --     Chemistries   Recent Labs Lab 05/16/16 1740 05/17/16 1141 05/18/16 0357 05/19/16 0558  NA 139 137 136 136  K 5.0 4.7 4.3 4.2  CL 104 99* 95* 97*  CO2 18* 23 30 28   GLUCOSE 164* 278* 256* 232*  BUN 99* 59* 31* 23*  CREATININE 12.03* 8.64* 5.81* 5.34*  CALCIUM 6.1* 6.9* 7.4* 7.8*  AST 16  --   --   --   ALT 14*  --   --   --   ALKPHOS 79  --   --   --   BILITOT 0.6  --   --   --    ------------------------------------------------------------------------------------------------------------------  Recent  Labs  05/17/16 0208  CHOL 196  HDL 32*  LDLCALC 115*  TRIG 246*  CHOLHDL 6.1    Lab Results  Component Value Date   HGBA1C 12.8 (H) 05/17/2016   ------------------------------------------------------------------------------------------------------------------ No results for input(s): TSH, T4TOTAL, T3FREE, THYROIDAB in the last 72 hours.  Invalid input(s): FREET3 ------------------------------------------------------------------------------------------------------------------  Recent Labs  05/17/16 0711  FERRITIN 139  TIBC  216*  IRON 17*    Coagulation profile  Recent Labs Lab 05/16/16 2000  INR 1.25    No results for input(s): DDIMER in the last 72 hours.  Cardiac Enzymes  Recent Labs Lab 05/16/16 2000 05/17/16 0208 05/17/16 0711  TROPONINI 0.03* 0.04* 0.04*   ------------------------------------------------------------------------------------------------------------------    Component Value Date/Time   BNP 662.2 (H) 05/16/2016 1740   BNP 41.7 05/05/2014 0904    Inpatient Medications  Scheduled Meds: . aspirin EC  81 mg Oral Daily  . atorvastatin  80 mg Oral q1800  . calcitRIOL  0.25 mcg Oral Q M,W,F-HD  . calcium acetate  1,334 mg Oral TID WC  . clopidogrel  75 mg Oral Daily  . cyclobenzaprine  10 mg Oral QHS  . darbepoetin (ARANESP) injection - DIALYSIS  100 mcg Intravenous Q Wed-HD  . gabapentin  800 mg Oral BID  . hydrOXYzine  25 mg Oral QHS  . insulin aspart  0-5 Units Subcutaneous QHS  . insulin aspart  0-9 Units Subcutaneous TID WC  . insulin glargine  30 Units Subcutaneous BID  . levothyroxine  50 mcg Oral QAC breakfast  . mouth rinse  15 mL Mouth Rinse BID  . metoCLOPramide  5 mg Oral BID  . metoprolol tartrate  50 mg Oral BID   Continuous Infusions: . sodium chloride 10 mL/hr at 05/19/16 1001  . cefTRIAXone (ROCEPHIN)  IV Stopped (05/18/16 2232)  . ferric gluconate (FERRLECIT/NULECIT) IV Stopped (05/18/16 1715)   PRN Meds:.acetaminophen, albuterol, clonazePAM, hydrALAZINE, morphine injection, nitroGLYCERIN, ondansetron (ZOFRAN) IV, zolpidem  Micro Results Recent Results (from the past 240 hour(s))  Urine culture     Status: None   Collection Time: 05/16/16  7:44 PM  Result Value Ref Range Status   Specimen Description URINE, RANDOM  Final   Special Requests NONE  Final   Culture NO GROWTH  Final   Report Status 05/18/2016 FINAL  Final  Culture, blood (x 2)     Status: None (Preliminary result)   Collection Time: 05/16/16  8:00 PM  Result Value Ref Range  Status   Specimen Description BLOOD RIGHT ANTECUBITAL  Final   Special Requests   Final    BOTTLES DRAWN AEROBIC AND ANAEROBIC Blood Culture adequate volume   Culture NO GROWTH 3 DAYS  Final   Report Status PENDING  Incomplete  Culture, blood (x 2)     Status: None (Preliminary result)   Collection Time: 05/16/16  8:32 PM  Result Value Ref Range Status   Specimen Description BLOOD RIGHT HAND  Final   Special Requests   Final    BOTTLES DRAWN AEROBIC AND ANAEROBIC Blood Culture adequate volume   Culture NO GROWTH 3 DAYS  Final   Report Status PENDING  Incomplete  MRSA PCR Screening     Status: None   Collection Time: 05/17/16  2:08 AM  Result Value Ref Range Status   MRSA by PCR NEGATIVE NEGATIVE Final    Comment:        The GeneXpert MRSA Assay (FDA approved for NASAL specimens only), is one component of a comprehensive MRSA  colonization surveillance program. It is not intended to diagnose MRSA infection nor to guide or monitor treatment for MRSA infections.   Surgical pcr screen     Status: None   Collection Time: 05/19/16  6:39 AM  Result Value Ref Range Status   MRSA, PCR NEGATIVE NEGATIVE Final   Staphylococcus aureus NEGATIVE NEGATIVE Final    Comment:        The Xpert SA Assay (FDA approved for NASAL specimens in patients over 68 years of age), is one component of a comprehensive surveillance program.  Test performance has been validated by [facility] for patients greater than or equal to 19 year old. It is not intended to diagnose infection nor to guide or monitor treatment.     Radiology Reports Ct Abdomen Pelvis Wo Contrast  Result Date: 04/27/2016 CLINICAL DATA:  46 year old male with abdominal and pelvic pain with nausea for 3 days. EXAM: CT ABDOMEN AND PELVIS WITHOUT CONTRAST TECHNIQUE: Multidetector CT imaging of the abdomen and pelvis was performed following the standard protocol without IV contrast. COMPARISON:  None. FINDINGS: Please note that  parenchymal abnormalities may be missed without intravenous contrast. Lower chest: No acute abnormality Hepatobiliary: The liver and gallbladder are unremarkable. There is no evidence of biliary dilatation. 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 There is no evidence of hydronephrosis or urinary calculi. The adrenal glands and bladder are unremarkable. Stomach/Bowel: A moderate amount of pneumoperitoneum throughout the mid and upper abdomen identified. A peritoneal dialysis catheter is noted. No bowel abnormalities are identified. No bowel wall thickening or bowel obstruction noted. There is no evidence of inflammatory changes around any bowel loops. Vascular/Lymphatic: Aortic atherosclerotic calcification noted without aneurysm. No enlarged lymph nodes identified. Reproductive: Prostate is unremarkable. Other: No ascites or abscess. Musculoskeletal: No acute or significant osseous findings. IMPRESSION: Moderate amount of pneumoperitoneum within the mid and upper abdomen. No bowel abnormality or inflammation is identified and pneumoperitoneum is most likely related to air from the patient's peritoneal dialysis. Correlate clinically and consider follow-up as bowel perforation it is difficult to entirely exclude. Abdominal aortic atherosclerosis. Horseshoe kidney. Electronically Signed   By: Harmon Pier M.D.   On: 04/27/2016 18:24   Dg Abd 1 View  Result Date: 04/28/2016 CLINICAL DATA:  Abdominal pain x2 days EXAM: ABDOMEN - 1 VIEW COMPARISON:  Same day CT FINDINGS: Peritoneal dialysis catheter traverses the left hemiabdomen with tip terminating in the pelvis. Visualization of both sides of bowel wall involving a segment of bowel in the left hemiabdomen would be consistent with pneumoperitoneum at noted on same day CT possibly related to the patient's peritoneal dialysis. No bowel obstruction is seen. Short term interval surveillance and follow-up  radiographs or CT is recommended if there is clinical concern for bowel perforation. No radio-opaque calculi nor acute osseous abnormality. Median sternotomy sutures are present. IMPRESSION: Subtle changes of pneumoperitoneum possibly related to peritoneal dialysis. No bowel obstruction noted. No significant accumulation of free air is apparent radiographically. Short-term interval follow-up radiographs or repeat CT may help to assure stability. Electronically Signed   By: Tollie Eth M.D.   On: 04/28/2016 00:01   Ct Angio Chest Pe W Or Wo Contrast  Result Date: 05/16/2016 CLINICAL DATA:  Shortness of breath, centralized chest pain radiating to the left, starting yesterday. Hypoxia. History of DVT, hypertension, CABG. EXAM: CT ANGIOGRAPHY CHEST WITH CONTRAST TECHNIQUE: Multidetector CT imaging of the chest was performed using the standard protocol during bolus administration of intravenous contrast.  Multiplanar CT image reconstructions and MIPs were obtained to evaluate the vascular anatomy. CONTRAST:  100 cc Isovue 370 COMPARISON:  CTA chest dated 07/19/2015. FINDINGS: Cardiovascular: The peripheral segmental and subsegmental pulmonary arteries cannot be definitively characterized due to patient breathing motion artifact. There is no central obstructing pulmonary embolism identified within the main or central lobar pulmonary arteries bilaterally. No obstructing pulmonary embolism identified within the central segmental pulmonary arteries. No aortic aneurysm or dissection. There is mild cardiomegaly. No pericardial effusion. Coronary artery calcifications noted. Patient is status post CABG. Mediastinum/Nodes: Enlarged lymph nodes noted within the right paratracheal mediastinum. Lymph node measuring 1.6 cm short axis dimension is seen on series 5, image 21. Additional enlarged lymph node is seen within the more inferior right paratracheal mediastinum measuring 1.9 cm short axis dimension on series 5, image 27.  These lymph nodes measured 8 mm and 10 mm short axis dimension respectively on CT of 07/19/2015. Esophagus appears normal. Trachea and central bronchi are unremarkable. Lungs/Pleura: Scattered ground-glass opacities bilaterally, perihilar predominant, most likely pulmonary edema. No pleural effusion or pneumothorax seen. Mild atelectasis at the left lung base. Upper Abdomen: No acute abnormality. Musculoskeletal: No chest wall abnormality. No acute or significant osseous findings. Review of the MIP images confirms the above findings. IMPRESSION: 1. No pulmonary embolism seen, with study limitations detailed above. The most peripheral segmental and subsegmental pulmonary arteries cannot be definitively characterized due to patient breathing motion artifact. No central obstructing pulmonary embolism identified. 2. Scattered ground-glass opacities bilaterally, perihilar predominant, most likely pulmonary edema related to CHF/volume overload. Differential for ground-glass opacities also includes atypical pneumonias such as viral or fungal, interstitial pneumonias, chronic interstitial diseases, hypersensitivity pneumonitis, and respiratory bronchiolitis. 3. Cardiomegaly. 4. **An incidental finding of potential clinical significance has been found. Interval enlargement of 2 lymph nodes in the right paratracheal mediastinum, measurements and positions given above, largest measuring 1.9 cm short axis dimension, both pathologic by CT size criteria. Neoplastic lymphadenopathy cannot be excluded. Consider further characterization with nonemergent PET-CT. ** Electronically Signed   By: Bary Richard M.D.   On: 05/16/2016 18:44   Dg Chest Port 1 View  Result Date: 05/16/2016 CLINICAL DATA:  Shortness of breath.  Left-sided chest pain. EXAM: PORTABLE CHEST 1 VIEW COMPARISON:  March 09, 2016 FINDINGS: Cardiomegaly. The hila and mediastinum are normal. Mild interstitial prominence. No focal infiltrate. IMPRESSION: Cardiomegaly.  Mild interstitial prominence suggesting pulmonary venous congestion. No focal infiltrate identified. Electronically Signed   By: Gerome Sam III M.D   On: 05/16/2016 18:45    Time Spent in minutes  25   Eddie North M.D on 05/19/2016 at 3:23 PM  Between 7am to 7pm - Pager - [phone]  After 7pm go to [email or web address] - password [facility]  [facility] -  Office  [phone]

## 2016-05-19 NOTE — Anesthesia Procedure Notes (Signed)
Procedure Name: Intubation Date/Time: 05/19/2016 11:13 AM Performed by: Tressia Miners LEFFEW Pre-anesthesia Checklist: Patient identified, Patient being monitored, Timeout performed, Emergency Drugs available and Suction available Patient Re-evaluated:Patient Re-evaluated prior to inductionOxygen Delivery Method: Circle System Utilized Preoxygenation: Pre-oxygenation with 100% oxygen Intubation Type: IV induction Ventilation: Two handed mask ventilation required and Oral airway inserted - appropriate to patient size Laryngoscope Size: Mac and 4 Grade View: Grade II Tube type: Oral Tube size: 7.5 mm Number of attempts: 1 Airway Equipment and Method: Stylet Placement Confirmation: ETT inserted through vocal cords under direct vision,  positive ETCO2 and breath sounds checked- equal and bilateral Secured at: 23 cm Tube secured with: Tape Dental Injury: Teeth and Oropharynx as per pre-operative assessment

## 2016-05-19 NOTE — Progress Notes (Signed)
Patient ID: Russell [name], male   DOB: July 02, 1970, 46 y.o.   MRN: [id number]  Pre Procedure note for inpatients:   Russell BirkenheadSteven B Trost has been scheduled for Procedure(s): PD CATH REMOVAL (N/A) today. The various methods of treatment have been discussed with the patient. After consideration of the risks, benefits and treatment options the patient has consented to the planned procedure.   The patient has been seen and labs reviewed. There are no changes in the patient's condition to prevent proceeding with the planned procedure today.  Recent labs:  Lab Results  Component Value Date   WBC 7.2 05/17/2016   HGB 8.1 (L) 05/17/2016   HCT 25.8 (L) 05/17/2016   PLT 268 05/17/2016   GLUCOSE 232 (H) 05/19/2016   CHOL 196 05/17/2016   TRIG 246 (H) 05/17/2016   HDL 32 (L) 05/17/2016   LDLCALC 115 (H) 05/17/2016   ALT 14 (L) 05/16/2016   AST 16 05/16/2016   NA 136 05/19/2016   K 4.2 05/19/2016   CL 97 (L) 05/19/2016   CREATININE 5.34 (H) 05/19/2016   BUN 23 (H) 05/19/2016   CO2 28 05/19/2016   TSH 1.243 07/20/2015   INR 1.25 05/16/2016   HGBA1C 12.8 (H) 05/17/2016   MICROALBUR 24.84 (H) 12/09/2012    Sindy Mccune A, MD 05/19/2016 10:29 AM

## 2016-05-19 NOTE — Op Note (Signed)
PD CATH REMOVAL  Procedure Note  Fonnie BirkenheadSteven B Majid 05/16/2016 - 05/19/2016   Pre-op Diagnosis: non functioning PD cath     Post-op Diagnosis: same  Procedure(s): PD CATH REMOVAL  Surgeon(s): Abigail MiyamotoBlackman, Rocio Wolak, MD  Anesthesia: General  Staff:  Circulator: Islam, Rushdan M, RN Scrub Person: Carmela RimaLeggio, Rebecca K Circulator Assistant: Royann ShiversLloyd, Peyton, RN  Estimated Blood Loss: Minimal               Procedure: The patient was brought to the operating room and identified the correct patient. He was placed supine on the operative table and general anesthesia was induced. His abdomen was then prepped and draped in usual sterile fashion. I made an elliptical incision around the peritoneal dialysis catheter exiting the left upper quadrant. I then took this down to the subcutaneous tissue with the cautery and excise all tissue around the cuff. I then made another incision on the abdominal wall over the tract of the palpable catheter. I then made a third incision with a catheter enter the abdominal cavity. Identified the cuff exiting the abdominal cavity. I excised all tissue around it. I then transected the catheter and pulled out the connecting metal piece at the epigastric incision and the rest the catheter that way. I then removed the intra-abdominal portion of the catheter. I then closed the small fascial opening with a figure-of-eight 0 Vicryl suture. The entire catheter was completely removed in sections. I anesthetized all wounds with Marcaine. I then closed subjacent tissue with interrupted 3-0 Vicryl sutures and closed the skin with running 4-0 Monocryl beats incision. Dermabond was then applied. The patient tolerated procedure well. All counts were correct at the end of the procedure. The patient was then extubated in the operating room and taken in a stable condition to the recovery room.          Izaias Krupka A   Date: 05/19/2016  Time: 11:44 AM

## 2016-05-19 NOTE — Transfer of Care (Signed)
Immediate Anesthesia Transfer of Care Note  Patient: Russell [name]  Procedure(s) Performed: Procedure(s): PD CATH REMOVAL (N/A)  Patient Location: PACU  Anesthesia Type:General  Level of Consciousness: awake, alert , oriented, patient cooperative and responds to stimulation  Airway & Oxygen Therapy: Patient Spontanous Breathing and Patient connected to face mask oxygen  Post-op Assessment: Report given to RN, Post -op Vital signs reviewed and stable and Patient moving all extremities X 4  Post vital signs: Reviewed and stable  Last Vitals:  Vitals:   05/18/16 2015 05/19/16 0553  BP: (!) 141/65 119/71  Pulse: 84 91  Resp: 16 16  Temp: 37.2 C 37.1 C    Last Pain:  Vitals:   05/19/16 0553  TempSrc: Oral  PainSc:       Patients Stated Pain Goal: 0 (05/18/16 0510)  Complications: No apparent anesthesia complications

## 2016-05-19 NOTE — Progress Notes (Signed)
Subjective:  S/p HD again last night- removed another 3.5- weight this AM 120.2after 3 days straight of HD-(edw 115).  I appreciate surgery input- for PD cath removal -done  Temp OK- he says he feels well  Objective Vital signs in last 24 hours: Vitals:   05/19/16 0553 05/19/16 1154 05/19/16 1210 05/19/16 1217  BP: 119/71 (!) 139/97 (!) 86/68 139/86  Pulse: 91 87 85 86  Resp: 16 15 15 15   Temp: 98.7 F (37.1 C) 98.2 F (36.8 C)  98.2 F (36.8 C)  TempSrc: Oral     SpO2: 94% 100% 94% 98%  Weight: 120.2 kg (265 lb 1.6 oz)     Height:       Weight change: -3.5 kg (-7 lb 11.5 oz)  Intake/Output Summary (Last 24 hours) at 05/19/16 1356 Last data filed at 05/19/16 1156  Gross per 24 hour  Intake             1220 ml  Output             3525 ml  Net            -2305 ml    Assessment/ Plan: Pt is a 46 y.o. yo male ESRD/ converted to PD 2.5 months ago who was admitted on 05/16/2016 with volume overload and uremia in the setting of not doing his PD- this is 3rd hosp since he has started Assessment/Plan: 1. Volume overload- not doing PD- also may be having some UF failure.  HD Tuesday night, Wed and Thursday , will actually plan for again tomorrow. Then my tentative plan would be to watch overnight after that- send home after HD tomorrow if OK with primary and surgery 2. ESRD- PD has not gone well for patient- agrees to go back to HD- will need to CLIP to [place], in process- was there before so should not be a lengthy process.   S/p PD cath removal today 3. Anemia- due to CKD- on iron and ESA 4. Secondary hyperparathyroidism- has not been under control as OP either- cont calcitriol and phoslo 5. HTN/volume- interesting that BP is under control in hospital but not as OP ?? Because NOT TAKING HIS MEDS. Will hold amlodipine so can UF with HD- cont metoprolol  6. Low grade temp- hopefully just a virus- no belly pain- started on rocephin- cultures negative 7. Incidental lymph node enlargement.   Work up as OP per primary team- I did inform patient so he is aware    Alyza Artiaga A    Labs: Basic Metabolic Panel:  Recent Labs Lab 05/17/16 1141 05/18/16 0357 05/19/16 0558  NA 137 136 136  K 4.7 4.3 4.2  CL 99* 95* 97*  CO2 23 30 28   GLUCOSE 278* 256* 232*  BUN 59* 31* 23*  CREATININE 8.64* 5.81* 5.34*  CALCIUM 6.9* 7.4* 7.8*  PHOS 8.7* 6.0* 5.6*   Liver Function Tests:  Recent Labs Lab 05/16/16 1740 05/17/16 1141 05/18/16 0357 05/19/16 0558  AST 16  --   --   --   ALT 14*  --   --   --   ALKPHOS 79  --   --   --   BILITOT 0.6  --   --   --   PROT 5.6*  --   --   --   ALBUMIN 2.8* 2.6* 2.5* 2.7*   No results for input(s): LIPASE, AMYLASE in the last 168 hours. No results for input(s): AMMONIA in the last 168 hours. CBC:  Recent Labs Lab 05/16/16 1740 05/17/16 1141  WBC 9.5 7.2  NEUTROABS 7.9*  --   HGB 8.1* 8.1*  HCT 24.6* 25.8*  MCV 86.3 86.9  PLT 265 268   Cardiac Enzymes:  Recent Labs Lab 05/16/16 2000 05/17/16 0208 05/17/16 0711  TROPONINI 0.03* 0.04* 0.04*   CBG:  Recent Labs Lab 05/18/16 2105 05/19/16 0736 05/19/16 0941 05/19/16 1159 05/19/16 1329  GLUCAP 297* 196* 176* 149* 160*    Iron Studies:   Recent Labs  05/17/16 0711  IRON 17*  TIBC 216*  FERRITIN 139   Studies/Results: No results found. Medications: Infusions: . sodium chloride 10 mL/hr at 05/19/16 1001  . cefTRIAXone (ROCEPHIN)  IV Stopped (05/18/16 2232)  . ferric gluconate (FERRLECIT/NULECIT) IV Stopped (05/18/16 1715)    Scheduled Medications: . aspirin EC  81 mg Oral Daily  . atorvastatin  80 mg Oral q1800  . calcitRIOL  0.25 mcg Oral Q M,W,F-HD  . calcium acetate  1,334 mg Oral TID WC  . clopidogrel  75 mg Oral Daily  . cyclobenzaprine  10 mg Oral QHS  . darbepoetin (ARANESP) injection - DIALYSIS  100 mcg Intravenous Q Wed-HD  . gabapentin  800 mg Oral BID  . hydrOXYzine  25 mg Oral QHS  . insulin aspart  0-5 Units Subcutaneous QHS   . insulin aspart  0-9 Units Subcutaneous TID WC  . insulin glargine  30 Units Subcutaneous BID  . levothyroxine  50 mcg Oral QAC breakfast  . mouth rinse  15 mL Mouth Rinse BID  . metoCLOPramide  5 mg Oral BID  . metoprolol tartrate  50 mg Oral BID    have reviewed scheduled and prn medications.  Physical Exam: General: obese,  NAD Heart: HR 80s, reg Lungs: dec BS at bases Abdomen: obese, distended- PD cath out Extremities: pitting edema Dialysis Access: left AVF    05/19/2016,1:56 PM  LOS: 2 days

## 2016-05-20 ENCOUNTER — Encounter (HOSPITAL_COMMUNITY): Payer: Self-pay | Admitting: Surgery

## 2016-05-20 LAB — GLUCOSE, CAPILLARY
GLUCOSE-CAPILLARY: 212 mg/dL — AB (ref 65–99)
Glucose-Capillary: 146 mg/dL — ABNORMAL HIGH (ref 65–99)

## 2016-05-20 LAB — CBC
HEMATOCRIT: 29.8 % — AB (ref 39.0–52.0)
HEMOGLOBIN: 9.2 g/dL — AB (ref 13.0–17.0)
MCH: 27.1 pg (ref 26.0–34.0)
MCHC: 30.9 g/dL (ref 30.0–36.0)
MCV: 87.9 fL (ref 78.0–100.0)
Platelets: 314 10*3/uL (ref 150–400)
RBC: 3.39 MIL/uL — ABNORMAL LOW (ref 4.22–5.81)
RDW: 16.2 % — AB (ref 11.5–15.5)
WBC: 7.6 10*3/uL (ref 4.0–10.5)

## 2016-05-20 LAB — RENAL FUNCTION PANEL
Albumin: 2.8 g/dL — ABNORMAL LOW (ref 3.5–5.0)
Anion gap: 14 (ref 5–15)
BUN: 40 mg/dL — AB (ref 6–20)
CHLORIDE: 94 mmol/L — AB (ref 101–111)
CO2: 24 mmol/L (ref 22–32)
Calcium: 7.9 mg/dL — ABNORMAL LOW (ref 8.9–10.3)
Creatinine, Ser: 8.17 mg/dL — ABNORMAL HIGH (ref 0.61–1.24)
GFR calc Af Amer: 8 mL/min — ABNORMAL LOW (ref >60)
GFR, EST NON AFRICAN AMERICAN: 7 mL/min — AB (ref >60)
GLUCOSE: 157 mg/dL — AB (ref 65–99)
POTASSIUM: 4.1 mmol/L (ref 3.5–5.1)
Phosphorus: 7.9 mg/dL — ABNORMAL HIGH (ref 2.5–4.6)
Sodium: 132 mmol/L — ABNORMAL LOW (ref 135–145)

## 2016-05-20 MED ORDER — INSULIN GLARGINE 100 UNIT/ML ~~LOC~~ SOLN: 40.0000 [IU] | Freq: Two times a day (BID) | SUBCUTANEOUS | 0 refills | Status: DC

## 2016-05-20 NOTE — Progress Notes (Signed)
Patient is discharge to home accompanied by patient's spouse and NT via wheelchair. Discharge instructions given . Patient verbalizes understanding. All personal belongings given. Telemetry box and IV removed prior to discharge and site in good condition.  

## 2016-05-20 NOTE — Procedures (Signed)
Patient was seen on dialysis and the procedure was supervised.  BFR 300  Via AVF BP is  104/70.   Patient appears to be tolerating treatment well  Dorothea Yow A 05/20/2016

## 2016-05-20 NOTE — Progress Notes (Signed)
1 Day Post-Op   Subjective/Chief Complaint: PT doing well    Objective: Vital signs in last 24 hours: Temp:  [98.1 F (36.7 C)-98.7 F (37.1 C)] 98.1 F (36.7 C) (05/19 0541) Pulse Rate:  [85-106] 99 (05/19 0541) Resp:  [15-18] 18 (05/19 0541) BP: (86-149)/(41-97) 148/89 (05/19 0541) SpO2:  [93 %-100 %] 93 % (05/19 0541) Weight:  [122.8 kg (270 lb 11.2 oz)] 122.8 kg (270 lb 11.2 oz) (05/19 0541) Last BM Date: 05/19/16  Intake/Output from previous day: 05/18 0701 - 05/19 0700 In: 740 [P.O.:240; I.V.:450; IV Piggyback:50] Out: 25 [Blood:25] Intake/Output this shift: No intake/output data recorded.  GI: incision c/d/i  Lab Results:   Recent Labs  05/17/16 1141 05/19/16 1544  WBC 7.2 7.8  HGB 8.1* 9.5*  HCT 25.8* 30.4*  PLT 268 324   BMET  Recent Labs  05/18/16 0357 05/19/16 0558  NA 136 136  K 4.3 4.2  CL 95* 97*  CO2 30 28  GLUCOSE 256* 232*  BUN 31* 23*  CREATININE 5.81* 5.34*  CALCIUM 7.4* 7.8*   PT/INR No results for input(s): LABPROT, INR in the last 72 hours. ABG No results for input(s): PHART, HCO3 in the last 72 hours.  Invalid input(s): PCO2, PO2  Studies/Results: No results found.  Anti-infectives: Anti-infectives    Start     Dose/Rate Route Frequency Ordered Stop   05/16/16 2000  cefTRIAXone (ROCEPHIN) 1 g in dextrose 5 % 50 mL IVPB     1 g 100 mL/hr over 30 Minutes Intravenous Every 24 hours 05/16/16 1947        Assessment/Plan: s/p Procedure(s): PD CATH REMOVAL (N/A) PLan for DC today per pt  PL to shower today F/u prn  LOS: 3 days    Marigene Ehlersamirez Jr., [facility] 05/20/2016

## 2016-05-20 NOTE — Progress Notes (Signed)
Subjective:  Seen on HD- goal of 4000 BP lowish- no c/o's - is set up for OP HD  Objective Vital signs in last 24 hours: Vitals:   05/20/16 0915 05/20/16 0930 05/20/16 1000 05/20/16 1030  BP: 96/60 (!) 98/53 113/69 (!) 78/28  Pulse: 100 96 98 64  Resp:      Temp:      TempSrc:      SpO2:      Weight:      Height:       Weight change: 0.089 kg (3.1 oz)  Intake/Output Summary (Last 24 hours) at 05/20/16 1044 Last data filed at 05/20/16 1610  Gross per 24 hour  Intake              980 ml  Output               25 ml  Net              955 ml    Assessment/ Plan: Pt is a 46 y.o. yo male ESRD/ converted to PD 2.5 months ago who was admitted on 05/16/2016 with volume overload and uremia in the setting of not doing his PD- this is 3rd hosp since he has started Assessment/Plan: 1. Volume overload- not doing PD- also may be having some UF failure.  HD Tuesday night, Wed and Thursday and Sat.  my tentative plan would be for discharge after HD today  if OK with primary and surgery 2. ESRD- PD has not gone well for patient- agrees to go back to HD- will need to CLIP to [place], in process- was there before so should not be a lengthy process.   S/p PD cath removal today.  Has spot TTS at [facility]- knows where to go and when 3. Anemia- due to CKD- on iron and ESA 4. Secondary hyperparathyroidism- has not been under control as OP either- cont calcitriol and phoslo 5. HTN/volume- interesting that BP is under control in hospital but not as OP ?? Because NOT TAKING HIS MEDS. Will hold amlodipine so can UF with HD- cont metoprolol  6. Low grade temp- hopefully just a virus- no belly pain- started on rocephin- cultures negative 7. Incidental lymph node enlargement.  Work up as OP per primary team and OP PCP- I did inform patient so he is aware    Kayson Bullis A    Labs: Basic Metabolic Panel:  Recent Labs Lab 05/18/16 0357 05/19/16 0558 05/20/16 0904  NA 136 136 132*  K 4.3 4.2  4.1  CL 95* 97* 94*  CO2 30 28 24   GLUCOSE 256* 232* 157*  BUN 31* 23* 40*  CREATININE 5.81* 5.34* 8.17*  CALCIUM 7.4* 7.8* 7.9*  PHOS 6.0* 5.6* 7.9*   Liver Function Tests:  Recent Labs Lab 05/16/16 1740  05/18/16 0357 05/19/16 0558 05/20/16 0904  AST 16  --   --   --   --   ALT 14*  --   --   --   --   ALKPHOS 79  --   --   --   --   BILITOT 0.6  --   --   --   --   PROT 5.6*  --   --   --   --   ALBUMIN 2.8*  < > 2.5* 2.7* 2.8*  < > = values in this interval not displayed. No results for input(s): LIPASE, AMYLASE in the last 168 hours. No results for input(s): AMMONIA in the last  168 hours. CBC:  Recent Labs Lab 05/16/16 1740 05/17/16 1141 05/19/16 1544 05/20/16 0903  WBC 9.5 7.2 7.8 7.6  NEUTROABS 7.9*  --   --   --   HGB 8.1* 8.1* 9.5* 9.2*  HCT 24.6* 25.8* 30.4* 29.8*  MCV 86.3 86.9 88.1 87.9  PLT 265 268 324 314   Cardiac Enzymes:  Recent Labs Lab 05/16/16 2000 05/17/16 0208 05/17/16 0711  TROPONINI 0.03* 0.04* 0.04*   CBG:  Recent Labs Lab 05/19/16 1159 05/19/16 1329 05/19/16 1626 05/19/16 2205 05/20/16 0737  GLUCAP 149* 160* 239* 186* 146*    Iron Studies:  No results for input(s): IRON, TIBC, TRANSFERRIN, FERRITIN in the last 72 hours. Studies/Results: No results found. Medications: Infusions: . sodium chloride 10 mL/hr at 05/19/16 1001  . cefTRIAXone (ROCEPHIN)  IV Stopped (05/19/16 2105)  . ferric gluconate (FERRLECIT/NULECIT) IV Stopped (05/18/16 1715)    Scheduled Medications: . aspirin EC  81 mg Oral Daily  . atorvastatin  80 mg Oral q1800  . calcitRIOL  0.25 mcg Oral Q M,W,F-HD  . calcium acetate  1,334 mg Oral TID WC  . clopidogrel  75 mg Oral Daily  . cyclobenzaprine  10 mg Oral QHS  . darbepoetin (ARANESP) injection - DIALYSIS  100 mcg Intravenous Q Wed-HD  . gabapentin  800 mg Oral BID  . hydrOXYzine  25 mg Oral QHS  . insulin aspart  0-5 Units Subcutaneous QHS  . insulin aspart  0-9 Units Subcutaneous TID WC  .  insulin glargine  40 Units Subcutaneous BID  . levothyroxine  50 mcg Oral QAC breakfast  . mouth rinse  15 mL Mouth Rinse BID  . metoCLOPramide  5 mg Oral BID  . metoprolol tartrate  50 mg Oral BID    have reviewed scheduled and prn medications.  Physical Exam: General: obese,  NAD Heart: HR 80s, reg Lungs: dec BS at bases Abdomen: obese, distended- PD cath out Extremities: pitting edema Dialysis Access: left AVF    05/20/2016,10:44 AM  LOS: 3 days

## 2016-05-20 NOTE — Discharge Summary (Signed)
Physician Discharge Summary  Russell BirkenheadSteven B [name] JYN:[id number] DOB: [birth year] DOA: 05/16/2016  PCP: Shade FloodGreene, Jeffrey R, MD  Admit date: 05/16/2016 Discharge date: 05/20/2016  Admitted From: Home Disposition: Home  Recommendations for Outpatient Follow-up:  1. Patient has been CLIPed to initiate hemodialysis on (tU, th , sat) in [place]. Next hemodialysis on 5/22 at 12 PM. 2. Please arrange outpatient PET scan to evaluate his enlarged right paratracheal lymph node seen on CT scan.  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  Brief narrative/history of present illness 46 year old male with history of ESRD on peritoneal dialysis, insulin-dependent diabetes mellitus, hypertension, CAD history of CABG, asthma, depression, anxiety, right leg DVT (completed 6 months of anticoagulation) and hypothyroidism presented with shortness of breath with low-grade fever and chest discomfort. Patient found to be in acute on chronic respiratory failure secondary to fluid overload. This is likely in the setting of nonadherence to his peritoneal dialysis at home.   Hospital course   Principal Problem: Acute on chronic respiratory failure with hypoxia (HCC) -Due to acute Volume overload from failed outpatient peritoneal dialysis (patient  noncompliant) -Resolved after initiation of hemodialysis. 2-D echo with EF of 55%, no wall motion abnormality, shows grade 2 diastolic dysfunction. Moderate pulmonary hypertension. -CT angio negative for PE.    Active Problems:  Low-grade fever  Possibly viral. Blood cultures negative. Has received 5 days of empiric Rocephin.    Chest pain Secondary to volume overload. Resolved with dialysis. 2-D echo reviewed.    Uncontrolled diabetes mellitus type 2 with peripheral artery disease (HCC) A1c of 12.8. Poorly controlled due to nonadherence to medications. CBG in 200s. Resume home dose Lantus and sliding scale coverage. Outpatient follow-up.    ESRD on dialysis [facility](HCC) Failed peritoneal dialysis due to nonadherence. Now switching to hemodialysis. Peritoneal dialysis catheter removed by surgery on 5/18. No complications. Patient has been CLIPed or hemodialysis in [place]. ((Tu, th, sat) Continue calcitriol and PhosLo.      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   Anemia due to chronic kidney disease/iron deficiency anemia  Stable at baseline.  Enlarged lymph node on CT chest Incidental finding with enlarged lymph node in the right paratracheal mediastinum. Recommend outpatient PET-CT.     Essential hypertension, benign Stable. Resume home medication.    Hypothyroidism (acquired) Continue Synthroid.    Code Status : Full code  Family Communication  : None at bedside  Disposition Plan  : Home with outpatient hemodialysis  Consults  :  Nephrology [place] [facility]  Procedures  :  CT angiogram of the chest 2-D echo Discharge Instructions   Allergies as of 05/20/2016      Reactions   Ibuprofen Other (See Comments)   MD told patient not to take  due to kidney disease   Nsaids Other (See  Comments)   Told to avoid all nsaids due to kidney disease    Tape Other (See Comments)   Welts result, if left for a long amount of time      Medication List    TAKE these medications   acetaminophen 500 MG tablet Commonly known as:  TYLENOL Take 1 tablet (500 mg total) by mouth every 6 (six) hours as needed for moderate pain. What changed:  how much to take  reasons to take this   albuterol (2.5 MG/3ML) 0.083% nebulizer solution Commonly known as:  PROVENTIL Take 2.5 mg by nebulization every 6 (six) hours as needed for wheezing.   albuterol 108 (90 Base) MCG/ACT inhaler Commonly known as:  PROVENTIL HFA;VENTOLIN HFA Inhale 1 puff into the lungs every 6 (six) hours as needed for wheezing or shortness of breath.   albuterol-ipratropium 18-103 MCG/ACT inhaler Commonly known as:  COMBIVENT Inhale 2 puffs into the lungs every 4 (four) hours. What changed:  when to take this  reasons to take this   amLODipine 10 MG tablet Commonly known as:  NORVASC Take 1 tablet (10 mg total) by mouth daily.   aspirin 81 MG EC tablet Take 1 tablet (81 mg total) by mouth daily.   atorvastatin 80 MG tablet Commonly known as:  LIPITOR Take 1 tablet (80 mg total) by mouth daily at 6 PM.   calcitRIOL 0.25 MCG capsule Commonly known as:  ROCALTROL Take 1 capsule (0.25 mcg total) by mouth every Monday, Wednesday, and Friday with hemodialysis.   calcium acetate 667 MG capsule Commonly known as:  PHOSLO Take 2 capsules (1,334 mg total) by mouth 3 (three) times daily with meals.   clopidogrel 75 MG tablet Commonly known as:  PLAVIX Take 1 tablet (75 mg total) by mouth daily.   cyclobenzaprine 10 MG tablet Commonly known as:  FLEXERIL Take 1 tablet (10 mg total) by mouth at bedtime.   gabapentin 100 MG capsule Commonly known as:  NEURONTIN Take 2 capsules (200 mg total) by mouth 3 (three) times daily. What changed:  how much to take  when to take this   hydrOXYzine 25 MG  tablet Commonly known as:  ATARAX/VISTARIL Take 25 mg by mouth at bedtime.   insulin aspart 100 UNIT/ML injection Commonly known as:  novoLOG Inject 5-10 Units into the skin 3 (three) times daily with meals. PER SLIDING SCALE   insulin glargine 100 UNIT/ML injection Commonly known as:  LANTUS Inject 0.4 mLs (40 Units total) into the skin 2 (two) times daily.   insulin lispro 100 UNIT/ML injection Commonly known as:  HUMALOG Inject 0.15-0.3 mLs (15-30 Units total) into the skin every evening. Take one unit per 5 carbs depending on dinner.   levothyroxine 50 MCG tablet Commonly known as:  SYNTHROID, LEVOTHROID Take 1 tablet (50 mcg total) by mouth daily.   metoCLOPramide 5 MG tablet Commonly known as:  REGLAN Take 1 tablet (5 mg total) by mouth every 8 (eight) hours as needed for nausea or vomiting. What changed:  when to take this   metoprolol tartrate 50 MG tablet Commonly known as:  LOPRESSOR Take 50 mg by mouth 2 (two) times daily.      Follow-up Information    Shade Flood, MD. Schedule an appointment as soon as possible for a visit in 1 week(s).   Specialties:  Family Medicine, Sports Medicine Contact information: [address] [place] [address] [phone]  Allergies  Allergen Reactions  . Ibuprofen Other (See Comments)    MD told patient not to take due to kidney disease  . Nsaids Other (See Comments)    Told to avoid all nsaids due to kidney disease   . Tape Other (See Comments)    Welts result, if left for a long amount of time        Procedures/Studies: Ct Abdomen Pelvis Wo Contrast  Result Date: 04/27/2016 CLINICAL DATA:  46 year old male with abdominal and pelvic pain with nausea for 3 days. EXAM: CT ABDOMEN AND PELVIS WITHOUT CONTRAST TECHNIQUE: Multidetector CT imaging of the abdomen and pelvis was performed following the standard protocol without IV contrast. COMPARISON:  None. FINDINGS: Please note that parenchymal  abnormalities may be missed without intravenous contrast. Lower chest: No acute abnormality Hepatobiliary: The liver and gallbladder are unremarkable. There is no evidence of biliary dilatation. 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 There is no evidence of hydronephrosis or urinary calculi. The adrenal glands and bladder are unremarkable. Stomach/Bowel: A moderate amount of pneumoperitoneum throughout the mid and upper abdomen identified. A peritoneal dialysis catheter is noted. No bowel abnormalities are identified. No bowel wall thickening or bowel obstruction noted. There is no evidence of inflammatory changes around any bowel loops. Vascular/Lymphatic: Aortic atherosclerotic calcification noted without aneurysm. No enlarged lymph nodes identified. Reproductive: Prostate is unremarkable. Other: No ascites or abscess. Musculoskeletal: No acute or significant osseous findings. IMPRESSION: Moderate amount of pneumoperitoneum within the mid and upper abdomen. No bowel abnormality or inflammation is identified and pneumoperitoneum is most likely related to air from the patient's peritoneal dialysis. Correlate clinically and consider follow-up as bowel perforation it is difficult to entirely exclude. Abdominal aortic atherosclerosis. Horseshoe kidney. Electronically Signed   By: Harmon Pier M.D.   On: 04/27/2016 18:24   Dg Abd 1 View  Result Date: 04/28/2016 CLINICAL DATA:  Abdominal pain x2 days EXAM: ABDOMEN - 1 VIEW COMPARISON:  Same day CT FINDINGS: Peritoneal dialysis catheter traverses the left hemiabdomen with tip terminating in the pelvis. Visualization of both sides of bowel wall involving a segment of bowel in the left hemiabdomen would be consistent with pneumoperitoneum at noted on same day CT possibly related to the patient's peritoneal dialysis. No bowel obstruction is seen. Short term interval surveillance and follow-up radiographs or  CT is recommended if there is clinical concern for bowel perforation. No radio-opaque calculi nor acute osseous abnormality. Median sternotomy sutures are present. IMPRESSION: Subtle changes of pneumoperitoneum possibly related to peritoneal dialysis. No bowel obstruction noted. No significant accumulation of free air is apparent radiographically. Short-term interval follow-up radiographs or repeat CT may help to assure stability. Electronically Signed   By: Tollie Eth M.D.   On: 04/28/2016 00:01   Ct Angio Chest Pe W Or Wo Contrast  Result Date: 05/16/2016 CLINICAL DATA:  Shortness of breath, centralized chest pain radiating to the left, starting yesterday. Hypoxia. History of DVT, hypertension, CABG. EXAM: CT ANGIOGRAPHY CHEST WITH CONTRAST TECHNIQUE: Multidetector CT imaging of the chest was performed using the standard protocol during bolus administration of intravenous contrast. Multiplanar CT image reconstructions and MIPs were obtained to evaluate the vascular anatomy. CONTRAST:  100 cc Isovue 370 COMPARISON:  CTA chest dated 07/19/2015. FINDINGS: Cardiovascular: The peripheral segmental and subsegmental pulmonary arteries cannot be definitively characterized due to patient breathing motion artifact. There is no central obstructing pulmonary embolism identified within the main or central lobar pulmonary arteries bilaterally. No obstructing  pulmonary embolism identified within the central segmental pulmonary arteries. No aortic aneurysm or dissection. There is mild cardiomegaly. No pericardial effusion. Coronary artery calcifications noted. Patient is status post CABG. Mediastinum/Nodes: Enlarged lymph nodes noted within the right paratracheal mediastinum. Lymph node measuring 1.6 cm short axis dimension is seen on series 5, image 21. Additional enlarged lymph node is seen within the more inferior right paratracheal mediastinum measuring 1.9 cm short axis dimension on series 5, image 27. These lymph nodes  measured 8 mm and 10 mm short axis dimension respectively on CT of 07/19/2015. Esophagus appears normal. Trachea and central bronchi are unremarkable. Lungs/Pleura: Scattered ground-glass opacities bilaterally, perihilar predominant, most likely pulmonary edema. No pleural effusion or pneumothorax seen. Mild atelectasis at the left lung base. Upper Abdomen: No acute abnormality. Musculoskeletal: No chest wall abnormality. No acute or significant osseous findings. Review of the MIP images confirms the above findings. IMPRESSION: 1. No pulmonary embolism seen, with study limitations detailed above. The most peripheral segmental and subsegmental pulmonary arteries cannot be definitively characterized due to patient breathing motion artifact. No central obstructing pulmonary embolism identified. 2. Scattered ground-glass opacities bilaterally, perihilar predominant, most likely pulmonary edema related to CHF/volume overload. Differential for ground-glass opacities also includes atypical pneumonias such as viral or fungal, interstitial pneumonias, chronic interstitial diseases, hypersensitivity pneumonitis, and respiratory bronchiolitis. 3. Cardiomegaly. 4. **An incidental finding of potential clinical significance has been found. Interval enlargement of 2 lymph nodes in the right paratracheal mediastinum, measurements and positions given above, largest measuring 1.9 cm short axis dimension, both pathologic by CT size criteria. Neoplastic lymphadenopathy cannot be excluded. Consider further characterization with nonemergent PET-CT. ** Electronically Signed   By: Bary Richard M.D.   On: 05/16/2016 18:44   Dg Chest Port 1 View  Result Date: 05/16/2016 CLINICAL DATA:  Shortness of breath.  Left-sided chest pain. EXAM: PORTABLE CHEST 1 VIEW COMPARISON:  March 09, 2016 FINDINGS: Cardiomegaly. The hila and mediastinum are normal. Mild interstitial prominence. No focal infiltrate. IMPRESSION: Cardiomegaly. Mild interstitial  prominence suggesting pulmonary venous congestion. No focal infiltrate identified. Electronically Signed   By: Gerome Sam III M.D   On: 05/16/2016 18:45    2-D echo Study Conclusions  - Left ventricle: The cavity size was normal. Wall thickness was   increased in a pattern of mild LVH. The estimated ejection   fraction was 55%. Although no diagnostic regional wall motion   abnormality was identified, this possibility cannot be completely   excluded on the basis of this study. Features are consistent with   a pseudonormal left ventricular filling pattern, with concomitant   abnormal relaxation and increased filling pressure (grade 2   diastolic dysfunction). - Aortic valve: There was no stenosis. - Mitral valve: There was no significant regurgitation. - Right ventricle: Poorly visualized. - Tricuspid valve: Peak RV-RA gradient (S): 40 mm Hg. - Pulmonary arteries: PA peak pressure: 55 mm Hg (S). - Systemic veins: IVC measured 2.5 cm with < 50% respirophasic   variation, suggesting RA pressure 15 mmHg.  Impressions:  - Technically difficult study with poor acoustic windows. Normal LV   size with mild LV hypertrophy, EF 55%. Moderate diastolic   dysfunction. RV poorly visualized. No significant valvular   abnormalities. Moderate pulmonary hypertension. Dilated IVC   suggestive of elevated RV filling pressure.   Subjective: Denies any shortness of breath. Denies pain at peritoneal dialysis catheter removal site.  Discharge Exam: Vitals:   05/20/16 1045 05/20/16 1100  BP: 104/70 (!) 80/54  Pulse:  100 (!) 102  Resp:    Temp:     Vitals:   05/20/16 1000 05/20/16 1030 05/20/16 1045 05/20/16 1100  BP: 113/69 (!) 78/28 104/70 (!) 80/54  Pulse: 98 64 100 (!) 102  Resp:      Temp:      TempSrc:      SpO2:      Weight:      Height:       Gen.: Middle aged male not in distress HEENT: Moist mucosa, supple neck Chest: Clear bilaterally CVS: Normal S1 and S2, no  murmurs GI: Soft, PD catheter removal site appears clean, nontender, nondistended Musculoskeletal: Warm, no edema, left upper extremity AV graft     The results of significant diagnostics from this hospitalization (including imaging, microbiology, ancillary and laboratory) are listed below for reference.     Microbiology: Recent Results (from the past 240 hour(s))  Urine culture     Status: None   Collection Time: 05/16/16  7:44 PM  Result Value Ref Range Status   Specimen Description URINE, RANDOM  Final   Special Requests NONE  Final   Culture NO GROWTH  Final   Report Status 05/18/2016 FINAL  Final  Culture, blood (x 2)     Status: None (Preliminary result)   Collection Time: 05/16/16  8:00 PM  Result Value Ref Range Status   Specimen Description BLOOD RIGHT ANTECUBITAL  Final   Special Requests   Final    BOTTLES DRAWN AEROBIC AND ANAEROBIC Blood Culture adequate volume   Culture NO GROWTH 3 DAYS  Final   Report Status PENDING  Incomplete  Culture, blood (x 2)     Status: None (Preliminary result)   Collection Time: 05/16/16  8:32 PM  Result Value Ref Range Status   Specimen Description BLOOD RIGHT HAND  Final   Special Requests   Final    BOTTLES DRAWN AEROBIC AND ANAEROBIC Blood Culture adequate volume   Culture NO GROWTH 3 DAYS  Final   Report Status PENDING  Incomplete  MRSA PCR Screening     Status: None   Collection Time: 05/17/16  2:08 AM  Result Value Ref Range Status   MRSA by PCR NEGATIVE NEGATIVE Final    Comment:        The GeneXpert MRSA Assay (FDA approved for NASAL specimens only), is one component of a comprehensive MRSA colonization surveillance program. It is not intended to diagnose MRSA infection nor to guide or monitor treatment for MRSA infections.   Surgical pcr screen     Status: None   Collection Time: 05/19/16  6:39 AM  Result Value Ref Range Status   MRSA, PCR NEGATIVE NEGATIVE Final   Staphylococcus aureus NEGATIVE NEGATIVE Final     Comment:        The Xpert SA Assay (FDA approved for NASAL specimens in patients over 13 years of age), is one component of a comprehensive surveillance program.  Test performance has been validated by [facility] for patients greater than or equal to 55 year old. It is not intended to diagnose infection nor to guide or monitor treatment.      Labs: BNP (last 3 results)  Recent Labs  01/10/16 1834 05/16/16 1740  BNP 182.8* 662.2*   Basic Metabolic Panel:  Recent Labs Lab 05/16/16 1740 05/16/16 2018 05/17/16 1141 05/18/16 0357 05/19/16 0558 05/20/16 0904  NA 139  --  137 136 136 132*  K 5.0  --  4.7 4.3 4.2 4.1  CL 104  --  99* 95* 97* 94*  CO2 18*  --  23 30 28 24   GLUCOSE 164*  --  278* 256* 232* 157*  BUN 99*  --  59* 31* 23* 40*  CREATININE 12.03*  --  8.64* 5.81* 5.34* 8.17*  CALCIUM 6.1*  --  6.9* 7.4* 7.8* 7.9*  PHOS  --  10.2* 8.7* 6.0* 5.6* 7.9*   Liver Function Tests:  Recent Labs Lab 05/16/16 1740 05/17/16 1141 05/18/16 0357 05/19/16 0558 05/20/16 0904  AST 16  --   --   --   --   ALT 14*  --   --   --   --   ALKPHOS 79  --   --   --   --   BILITOT 0.6  --   --   --   --   PROT 5.6*  --   --   --   --   ALBUMIN 2.8* 2.6* 2.5* 2.7* 2.8*   No results for input(s): LIPASE, AMYLASE in the last 168 hours. No results for input(s): AMMONIA in the last 168 hours. CBC:  Recent Labs Lab 05/16/16 1740 05/17/16 1141 05/19/16 1544 05/20/16 0903  WBC 9.5 7.2 7.8 7.6  NEUTROABS 7.9*  --   --   --   HGB 8.1* 8.1* 9.5* 9.2*  HCT 24.6* 25.8* 30.4* 29.8*  MCV 86.3 86.9 88.1 87.9  PLT 265 268 324 314   Cardiac Enzymes:  Recent Labs Lab 05/16/16 2000 05/17/16 0208 05/17/16 0711  TROPONINI 0.03* 0.04* 0.04*   BNP: Invalid input(s): POCBNP CBG:  Recent Labs Lab 05/19/16 1159 05/19/16 1329 05/19/16 1626 05/19/16 2205 05/20/16 0737  GLUCAP 149* 160* 239* 186* 146*   D-Dimer No results for input(s): DDIMER in the last 72  hours. Hgb A1c No results for input(s): HGBA1C in the last 72 hours. Lipid Profile No results for input(s): CHOL, HDL, LDLCALC, TRIG, CHOLHDL, LDLDIRECT in the last 72 hours. Thyroid function studies No results for input(s): TSH, T4TOTAL, T3FREE, THYROIDAB in the last 72 hours.  Invalid input(s): FREET3 Anemia work up No results for input(s): VITAMINB12, FOLATE, FERRITIN, TIBC, IRON, RETICCTPCT in the last 72 hours. Urinalysis    Component Value Date/Time   COLORURINE YELLOW 05/18/2016 2110   APPEARANCEUR HAZY (A) 05/18/2016 2110   LABSPEC 1.018 05/18/2016 2110   PHURINE 7.0 05/18/2016 2110   GLUCOSEU >=500 (A) 05/18/2016 2110   HGBUR NEGATIVE 05/18/2016 2110   BILIRUBINUR NEGATIVE 05/18/2016 2110   BILIRUBINUR neg 05/26/2014 1956   KETONESUR NEGATIVE 05/18/2016 2110   PROTEINUR >=300 (A) 05/18/2016 2110   UROBILINOGEN 0.2 09/30/2014 1730   NITRITE NEGATIVE 05/18/2016 2110   LEUKOCYTESUR NEGATIVE 05/18/2016 2110   Sepsis Labs Invalid input(s): PROCALCITONIN,  WBC,  LACTICIDVEN Microbiology Recent Results (from the past 240 hour(s))  Urine culture     Status: None   Collection Time: 05/16/16  7:44 PM  Result Value Ref Range Status   Specimen Description URINE, RANDOM  Final   Special Requests NONE  Final   Culture NO GROWTH  Final   Report Status 05/18/2016 FINAL  Final  Culture, blood (x 2)     Status: None (Preliminary result)   Collection Time: 05/16/16  8:00 PM  Result Value Ref Range Status   Specimen Description BLOOD RIGHT ANTECUBITAL  Final   Special Requests   Final    BOTTLES DRAWN AEROBIC AND ANAEROBIC Blood Culture adequate volume   Culture NO GROWTH 3 DAYS  Final  Report Status PENDING  Incomplete  Culture, blood (x 2)     Status: None (Preliminary result)   Collection Time: 05/16/16  8:32 PM  Result Value Ref Range Status   Specimen Description BLOOD RIGHT HAND  Final   Special Requests   Final    BOTTLES DRAWN AEROBIC AND ANAEROBIC Blood Culture  adequate volume   Culture NO GROWTH 3 DAYS  Final   Report Status PENDING  Incomplete  MRSA PCR Screening     Status: None   Collection Time: 05/17/16  2:08 AM  Result Value Ref Range Status   MRSA by PCR NEGATIVE NEGATIVE Final    Comment:        The GeneXpert MRSA Assay (FDA approved for NASAL specimens only), is one component of a comprehensive MRSA colonization surveillance program. It is not intended to diagnose MRSA infection nor to guide or monitor treatment for MRSA infections.   Surgical pcr screen     Status: None   Collection Time: 05/19/16  6:39 AM  Result Value Ref Range Status   MRSA, PCR NEGATIVE NEGATIVE Final   Staphylococcus aureus NEGATIVE NEGATIVE Final    Comment:        The Xpert SA Assay (FDA approved for NASAL specimens in patients over 64 years of age), is one component of a comprehensive surveillance program.  Test performance has been validated by [facility] for patients greater than or equal to 67 year old. It is not intended to diagnose infection nor to guide or monitor treatment.      Time coordinating discharge: Over 30 minutes  SIGNED:   Eddie North, MD  [facility] 05/20/2016, 11:17 AM Pager   If 7PM-7AM, please contact night-coverage [email or web address] Password [email or web address]

## 2016-05-21 LAB — CULTURE, BLOOD (ROUTINE X 2)
CULTURE: NO GROWTH
CULTURE: NO GROWTH
SPECIAL REQUESTS: ADEQUATE
SPECIAL REQUESTS: ADEQUATE

## 2016-05-23 ENCOUNTER — Emergency Department (HOSPITAL_COMMUNITY): Payer: Medicare Other

## 2016-05-23 ENCOUNTER — Observation Stay (HOSPITAL_COMMUNITY)
Admission: EM | Admit: 2016-05-23 | Discharge: 2016-05-25 | Disposition: A | Discharge status: Home / Self Care | Payer: Medicare Other | Attending: Internal Medicine | Admitting: Internal Medicine

## 2016-05-23 ENCOUNTER — Encounter (HOSPITAL_COMMUNITY): Payer: Self-pay | Admitting: Emergency Medicine

## 2016-05-23 DIAGNOSIS — I10 Essential (primary) hypertension: Secondary | ICD-10-CM

## 2016-05-23 DIAGNOSIS — Z992 Dependence on renal dialysis: Secondary | ICD-10-CM | POA: Insufficient documentation

## 2016-05-23 DIAGNOSIS — I120 Hypertensive chronic kidney disease with stage 5 chronic kidney disease or end stage renal disease: Secondary | ICD-10-CM | POA: Diagnosis not present

## 2016-05-23 DIAGNOSIS — IMO0002 Reserved for concepts with insufficient information to code with codable children: Secondary | ICD-10-CM | POA: Diagnosis present

## 2016-05-23 DIAGNOSIS — N2581 Secondary hyperparathyroidism of renal origin: Secondary | ICD-10-CM | POA: Diagnosis not present

## 2016-05-23 DIAGNOSIS — Z951 Presence of aortocoronary bypass graft: Secondary | ICD-10-CM | POA: Insufficient documentation

## 2016-05-23 DIAGNOSIS — E211 Secondary hyperparathyroidism, not elsewhere classified: Secondary | ICD-10-CM | POA: Diagnosis not present

## 2016-05-23 DIAGNOSIS — Z8719 Personal history of other diseases of the digestive system: Secondary | ICD-10-CM

## 2016-05-23 DIAGNOSIS — Z79899 Other long term (current) drug therapy: Secondary | ICD-10-CM | POA: Insufficient documentation

## 2016-05-23 DIAGNOSIS — E1151 Type 2 diabetes mellitus with diabetic peripheral angiopathy without gangrene: Secondary | ICD-10-CM | POA: Diagnosis not present

## 2016-05-23 DIAGNOSIS — E1122 Type 2 diabetes mellitus with diabetic chronic kidney disease: Secondary | ICD-10-CM | POA: Diagnosis not present

## 2016-05-23 DIAGNOSIS — I25810 Atherosclerosis of coronary artery bypass graft(s) without angina pectoris: Secondary | ICD-10-CM | POA: Diagnosis not present

## 2016-05-23 DIAGNOSIS — I208 Other forms of angina pectoris: Secondary | ICD-10-CM | POA: Diagnosis not present

## 2016-05-23 DIAGNOSIS — N049 Nephrotic syndrome with unspecified morphologic changes: Secondary | ICD-10-CM

## 2016-05-23 DIAGNOSIS — Z794 Long term (current) use of insulin: Secondary | ICD-10-CM | POA: Insufficient documentation

## 2016-05-23 DIAGNOSIS — Z87891 Personal history of nicotine dependence: Secondary | ICD-10-CM | POA: Diagnosis not present

## 2016-05-23 DIAGNOSIS — Z7982 Long term (current) use of aspirin: Secondary | ICD-10-CM | POA: Diagnosis not present

## 2016-05-23 DIAGNOSIS — R072 Precordial pain: Principal | ICD-10-CM | POA: Insufficient documentation

## 2016-05-23 DIAGNOSIS — D631 Anemia in chronic kidney disease: Secondary | ICD-10-CM | POA: Diagnosis not present

## 2016-05-23 DIAGNOSIS — N186 End stage renal disease: Secondary | ICD-10-CM | POA: Diagnosis not present

## 2016-05-23 DIAGNOSIS — R0602 Shortness of breath: Secondary | ICD-10-CM | POA: Insufficient documentation

## 2016-05-23 DIAGNOSIS — J45909 Unspecified asthma, uncomplicated: Secondary | ICD-10-CM | POA: Diagnosis not present

## 2016-05-23 DIAGNOSIS — E1165 Type 2 diabetes mellitus with hyperglycemia: Secondary | ICD-10-CM | POA: Diagnosis not present

## 2016-05-23 DIAGNOSIS — E039 Hypothyroidism, unspecified: Secondary | ICD-10-CM | POA: Diagnosis not present

## 2016-05-23 DIAGNOSIS — R591 Generalized enlarged lymph nodes: Secondary | ICD-10-CM

## 2016-05-23 DIAGNOSIS — R079 Chest pain, unspecified: Secondary | ICD-10-CM

## 2016-05-23 LAB — BASIC METABOLIC PANEL
ANION GAP: 10 (ref 5–15)
BUN: 38 mg/dL — ABNORMAL HIGH (ref 6–20)
CALCIUM: 7.8 mg/dL — AB (ref 8.9–10.3)
CO2: 29 mmol/L (ref 22–32)
Chloride: 97 mmol/L — ABNORMAL LOW (ref 101–111)
Creatinine, Ser: 7.17 mg/dL — ABNORMAL HIGH (ref 0.61–1.24)
GFR, EST AFRICAN AMERICAN: 10 mL/min — AB (ref >60)
GFR, EST NON AFRICAN AMERICAN: 8 mL/min — AB (ref >60)
Glucose, Bld: 259 mg/dL — ABNORMAL HIGH (ref 65–99)
POTASSIUM: 4.3 mmol/L (ref 3.5–5.1)
Sodium: 136 mmol/L (ref 135–145)

## 2016-05-23 LAB — CBC
HEMATOCRIT: 27.3 % — AB (ref 39.0–52.0)
HEMOGLOBIN: 8.8 g/dL — AB (ref 13.0–17.0)
MCH: 28 pg (ref 26.0–34.0)
MCHC: 32.2 g/dL (ref 30.0–36.0)
MCV: 86.9 fL (ref 78.0–100.0)
Platelets: 321 10*3/uL (ref 150–400)
RBC: 3.14 MIL/uL — AB (ref 4.22–5.81)
RDW: 16.1 % — ABNORMAL HIGH (ref 11.5–15.5)
WBC: 6.2 10*3/uL (ref 4.0–10.5)

## 2016-05-23 LAB — BRAIN NATRIURETIC PEPTIDE: B NATRIURETIC PEPTIDE 5: 299 pg/mL — AB (ref 0.0–100.0)

## 2016-05-23 LAB — TROPONIN I

## 2016-05-23 LAB — GLUCOSE, CAPILLARY: Glucose-Capillary: 128 mg/dL — ABNORMAL HIGH (ref 65–99)

## 2016-05-23 LAB — I-STAT TROPONIN, ED: TROPONIN I, POC: 0.01 ng/mL (ref 0.00–0.08)

## 2016-05-23 MED ORDER — NITROGLYCERIN 0.4 MG SL SUBL
0.4000 mg | SUBLINGUAL_TABLET | SUBLINGUAL | Status: DC | PRN
Start: 2016-05-23 — End: 2016-05-25
  Administered 2016-05-23: 0.4 mg via SUBLINGUAL
  Filled 2016-05-23: qty 1

## 2016-05-23 MED ORDER — INSULIN GLARGINE 100 UNIT/ML ~~LOC~~ SOLN
40.0000 [IU] | Freq: Two times a day (BID) | SUBCUTANEOUS | Status: DC
  Administered 2016-05-23 – 2016-05-25 (×3): 40 [IU] via SUBCUTANEOUS
  Filled 2016-05-23 (×5): qty 0.4

## 2016-05-23 MED ORDER — METOCLOPRAMIDE HCL 5 MG PO TABS: 5.0000 mg | ORAL_TABLET | Freq: Three times a day (TID) | ORAL | Status: DC | PRN

## 2016-05-23 MED ORDER — LEVOTHYROXINE SODIUM 50 MCG PO TABS
50.0000 ug | ORAL_TABLET | Freq: Every day | ORAL | Status: DC
  Administered 2016-05-24 – 2016-05-25 (×2): 50 ug via ORAL
  Filled 2016-05-23 (×2): qty 1

## 2016-05-23 MED ORDER — HYDROCODONE-ACETAMINOPHEN 5-325 MG PO TABS
1.0000 | ORAL_TABLET | ORAL | Status: DC | PRN
  Administered 2016-05-23 – 2016-05-24 (×4): 1 via ORAL
  Filled 2016-05-23 (×4): qty 1

## 2016-05-23 MED ORDER — ATORVASTATIN CALCIUM 80 MG PO TABS
80.0000 mg | ORAL_TABLET | Freq: Every day | ORAL | Status: DC
  Administered 2016-05-24: 80 mg via ORAL
  Filled 2016-05-23: qty 1

## 2016-05-23 MED ORDER — SODIUM CHLORIDE 0.9 % IV SOLN: 100.0000 mL | INTRAVENOUS | Status: DC | PRN

## 2016-05-23 MED ORDER — METOPROLOL TARTRATE 50 MG PO TABS
50.0000 mg | ORAL_TABLET | Freq: Two times a day (BID) | ORAL | Status: DC
  Administered 2016-05-23 – 2016-05-25 (×3): 50 mg via ORAL
  Filled 2016-05-23 (×3): qty 1

## 2016-05-23 MED ORDER — LIDOCAINE-PRILOCAINE 2.5-2.5 % EX CREA
1.0000 "application " | TOPICAL_CREAM | CUTANEOUS | Status: DC | PRN
  Filled 2016-05-23: qty 5

## 2016-05-23 MED ORDER — PENTAFLUOROPROP-TETRAFLUOROETH EX AERO: 1.0000 "application " | INHALATION_SPRAY | CUTANEOUS | Status: DC | PRN

## 2016-05-23 MED ORDER — CYCLOBENZAPRINE HCL 10 MG PO TABS
10.0000 mg | ORAL_TABLET | Freq: Every day | ORAL | Status: DC
  Administered 2016-05-23 – 2016-05-24 (×2): 10 mg via ORAL
  Filled 2016-05-23 (×2): qty 1

## 2016-05-23 MED ORDER — HYDROXYZINE HCL 25 MG PO TABS
25.0000 mg | ORAL_TABLET | Freq: Every day | ORAL | Status: DC
  Administered 2016-05-23 – 2016-05-24 (×2): 25 mg via ORAL
  Filled 2016-05-23 (×2): qty 1

## 2016-05-23 MED ORDER — CALCIUM ACETATE (PHOS BINDER) 667 MG PO CAPS
1334.0000 mg | ORAL_CAPSULE | Freq: Three times a day (TID) | ORAL | Status: DC
  Administered 2016-05-24 – 2016-05-25 (×3): 1334 mg via ORAL
  Filled 2016-05-23 (×5): qty 2

## 2016-05-23 MED ORDER — ALTEPLASE 2 MG IJ SOLR: 2.0000 mg | Freq: Once | INTRAMUSCULAR | Status: DC | PRN

## 2016-05-23 MED ORDER — INSULIN ASPART 100 UNIT/ML ~~LOC~~ SOLN
0.0000 [IU] | Freq: Every day | SUBCUTANEOUS | Status: DC
  Administered 2016-05-24: 2 [IU] via SUBCUTANEOUS

## 2016-05-23 MED ORDER — INSULIN ASPART 100 UNIT/ML ~~LOC~~ SOLN
10.0000 [IU] | Freq: Three times a day (TID) | SUBCUTANEOUS | Status: DC
  Administered 2016-05-24 – 2016-05-25 (×4): 10 [IU] via SUBCUTANEOUS

## 2016-05-23 MED ORDER — AMLODIPINE BESYLATE 10 MG PO TABS
10.0000 mg | ORAL_TABLET | Freq: Every day | ORAL | Status: DC
  Administered 2016-05-25: 10 mg via ORAL
  Filled 2016-05-23: qty 1

## 2016-05-23 MED ORDER — INSULIN ASPART 100 UNIT/ML ~~LOC~~ SOLN
7.0000 [IU] | Freq: Once | SUBCUTANEOUS | Status: AC
  Administered 2016-05-24: 7 [IU] via SUBCUTANEOUS

## 2016-05-23 MED ORDER — CLOPIDOGREL BISULFATE 75 MG PO TABS
75.0000 mg | ORAL_TABLET | Freq: Every day | ORAL | Status: DC
  Administered 2016-05-24 – 2016-05-25 (×2): 75 mg via ORAL
  Filled 2016-05-23 (×2): qty 1

## 2016-05-23 MED ORDER — ASPIRIN EC 81 MG PO TBEC
81.0000 mg | DELAYED_RELEASE_TABLET | Freq: Every day | ORAL | Status: DC
  Administered 2016-05-24 – 2016-05-25 (×2): 81 mg via ORAL
  Filled 2016-05-23 (×2): qty 1

## 2016-05-23 MED ORDER — GABAPENTIN 400 MG PO CAPS
800.0000 mg | ORAL_CAPSULE | Freq: Two times a day (BID) | ORAL | Status: DC
  Administered 2016-05-23 – 2016-05-25 (×4): 800 mg via ORAL
  Filled 2016-05-23 (×4): qty 2

## 2016-05-23 MED ORDER — ACETAMINOPHEN 325 MG PO TABS
650.0000 mg | ORAL_TABLET | ORAL | Status: DC | PRN
Start: 2016-05-23 — End: 2016-05-25

## 2016-05-23 MED ORDER — HEPARIN SODIUM (PORCINE) 1000 UNIT/ML DIALYSIS
1000.0000 [IU] | INTRAMUSCULAR | Status: DC | PRN
  Filled 2016-05-23: qty 1

## 2016-05-23 MED ORDER — HYDROMORPHONE HCL 2 MG PO TABS
2.0000 mg | ORAL_TABLET | ORAL | Status: DC | PRN
  Administered 2016-05-24 – 2016-05-25 (×2): 2 mg via ORAL
  Filled 2016-05-23 (×2): qty 1

## 2016-05-23 MED ORDER — INSULIN ASPART 100 UNIT/ML ~~LOC~~ SOLN
0.0000 [IU] | Freq: Three times a day (TID) | SUBCUTANEOUS | Status: DC
  Administered 2016-05-24 – 2016-05-25 (×5): 3 [IU] via SUBCUTANEOUS

## 2016-05-23 MED ORDER — CALCITRIOL 0.25 MCG PO CAPS: 0.2500 ug | ORAL_CAPSULE | ORAL | Status: DC

## 2016-05-23 MED ORDER — NITROGLYCERIN 0.4 MG SL SUBL: 0.4000 mg | SUBLINGUAL_TABLET | SUBLINGUAL | Status: DC | PRN

## 2016-05-23 MED ORDER — ONDANSETRON HCL 4 MG/2ML IJ SOLN: 4.0000 mg | Freq: Four times a day (QID) | INTRAMUSCULAR | Status: DC | PRN

## 2016-05-23 MED ORDER — ALBUTEROL SULFATE (2.5 MG/3ML) 0.083% IN NEBU: 2.5000 mg | INHALATION_SOLUTION | Freq: Four times a day (QID) | RESPIRATORY_TRACT | Status: DC | PRN

## 2016-05-23 MED ORDER — HEPARIN SODIUM (PORCINE) 5000 UNIT/ML IJ SOLN
5000.0000 [IU] | Freq: Three times a day (TID) | INTRAMUSCULAR | Status: DC
  Filled 2016-05-23 (×2): qty 1

## 2016-05-23 MED ORDER — HEPARIN SODIUM (PORCINE) 1000 UNIT/ML DIALYSIS
40.0000 [IU]/kg | INTRAMUSCULAR | Status: DC | PRN
  Filled 2016-05-23: qty 5

## 2016-05-23 MED ORDER — LIDOCAINE HCL (PF) 1 % IJ SOLN
5.0000 mL | INTRAMUSCULAR | Status: DC | PRN
  Filled 2016-05-23: qty 5

## 2016-05-23 NOTE — ED Notes (Signed)
Attempted report x1. 

## 2016-05-23 NOTE — Consult Note (Addendum)
CARDIOLOGY CONSULT NOTE   Patient ID: BRACEN SCHUM MRN: [id number] DOB/AGE: [birth year] 46 y.o.  Admit date: 05/23/2016  Requesting Physician: Dr. Rhunette Croft Primary Physician:   Shade Flood, MD Primary Cardiologist:   Dr. Jens Som Reason for Consultation:  Chest pain   SHARBEL SAHAGUN is a 46 y.o. male who is being seen today for the evaluation of chest pain at the request of Dr Rhunette Croft.   HPI: BEECHER FURIO is a 46 y.o. male with a history of nephrotic syndrome, HTN, ESRD on peritoneal HD, CAD s/p CABGx3V (09/2015), morbid obesity, DMT2, and DVT who presented to [facility] ED today with chest pain.   He had bilateral DVTs around 2 years ago and he completed 6 months of Xarelto therapy.   He was admitted 9/18-10/3/18 for NSTEMI. 2D ECHO 09/2015 showed normal LV systolic function, moderate left ventricular hypertrophy. Cardiac catheterization 09/2015 showed a 90% ramus, 95% first diagonal, 80% proximal LAD and normal LV function. Carotid Dopplers 09/2015 showed 1-39% bilateral stenosis. Patient subsequently had coronary artery bypass and graft with a LIMA to LAD, saphenous vein graft to the diagonal and saphenous vein graft to the obtuse marginal.   He was recently admitted 5/15-5/19/18 for acute on chronic respiratory failure secondary to fluid overload. This was felt 2/2 to non adherence to his peritoneal dialysis at home. 2D echo showed EF of 55%, no wall motion abnormality, grade 2 diastolic dysfunction and moderate pulmonary hypertension. CTA was negative for PE. Discharge weight 265 lbs.  He presented to [facility] today with sharp chest pain that is better with pressing on his chest and laying flat. It is worse with a deep breath in. The chest pain has been constant. He has had worsening SOB for the last couple days. He has no LE edema, orthopnea or PND. No dizziness or syncope. No palpitations. Today weight is 267 lbs.    Past Medical History:  Diagnosis Date  . Anxiety   .  Asthma   . 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 Depression   . DVT (deep venous thrombosis), H/o 01/2014-on Xarelto 03/24/2014   LLE  . ESRD (end stage renal disease) (HCC)   . Hypertension   . Hypothyroidism   . Nephrotic syndrome 05/18/2014  . Type 2 diabetes mellitus with diabetic nephropathy [facility])      Past Surgical History:  Procedure Laterality Date  . ANKLE FRACTURE SURGERY Right 1988  . AV FISTULA PLACEMENT Left 01/01/2015   Procedure: CREATION OF LEFT RADIAL CEPHALIC ARTERIOVENOUS (AV) FISTULA ;  Surgeon: Pryor Ochoa, MD;  Location: [facility] OR;  Service: Vascular;  Laterality: Left;  . CAPD REMOVAL N/A 05/19/2016   Procedure: PD CATH REMOVAL;  Surgeon: Abigail Miyamoto, MD;  Location: [facility] OR;  Service: General;  Laterality: N/A;  . CARDIAC CATHETERIZATION N/A 09/22/2015   Procedure: Left Heart Cath and Coronary Angiography;  Surgeon: Iran Ouch, MD;  Location: [facility] INVASIVE CV LAB;  Service: Cardiovascular;  Laterality: N/A;  . CORONARY ARTERY BYPASS GRAFT N/A 09/28/2015   Procedure: CORONARY ARTERY BYPASS GRAFTING (CABG) x 3 UTILIZING LEFT MAMMARY ARTERY AND ENDOSCOPICALLY HARVESTED LEFT GREATER SAPHENOUS VEIN.;  Surgeon: Delight Ovens, MD;  Location: [facility] OR;  Service: Open Heart Surgery;  Laterality: N/A;  . ENDOVEIN HARVEST OF GREATER SAPHENOUS VEIN Left 09/28/2015   Procedure: ENDOVEIN  HARVEST OF GREATER SAPHENOUS VEIN;  Surgeon: Delight OvensEdward B Gerhardt, MD;  [facility];  Service: Open Heart Surgery;  Laterality: Left;  . ESOPHAGOGASTRODUODENOSCOPY N/A 03/29/2014   Procedure: ESOPHAGOGASTRODUODENOSCOPY (EGD);  Surgeon: Dorena CookeyJohn Hayes, MD;  [facility];  Service: Endoscopy;  Laterality: N/A;  . EYE SURGERY    . FRACTURE SURGERY    . INSERTION OF DIALYSIS CATHETER Right 01/05/2015   Procedure: INSERTION OF RIGHT INTERNAL JUGULAR  DIALYSIS CATHETER;  Surgeon: Fransisco HertzBrian L Chen, MD;  [facility];  Service: Vascular;  Laterality: Right;  . TEE WITHOUT CARDIOVERSION N/A 09/28/2015   Procedure: TRANSESOPHAGEAL ECHOCARDIOGRAM (TEE);  Surgeon: Delight OvensEdward B Gerhardt, MD;  [facility];  Service: Open Heart Surgery;  Laterality: N/A;  . TONSILLECTOMY AND ADENOIDECTOMY  1970s    Allergies  Allergen Reactions  . Ibuprofen Other (See Comments)    MD told patient not to take due to kidney disease  . Nsaids Other (See Comments)    Told to avoid all nsaids due to kidney disease   . Tape Other (See Comments)    Welts result, if left for a long amount of time    I have reviewed the patient's current medications . [START ON 05/25/2016] calcitRIOL  0.25 mcg Oral Q T,Th,Sa-HD    nitroGLYCERIN  Prior to Admission medications   Medication Sig Start Date End Date Taking? Authorizing Provider  acetaminophen (TYLENOL) 500 MG tablet Take 1 tablet (500 mg total) by mouth every 6 (six) hours as needed for moderate pain. Patient taking differently: Take 1,000 mg by mouth every 6 (six) hours as needed (for pain).  09/29/14  Yes Albertine GratesXu, Fang, MD  albuterol (PROVENTIL HFA;VENTOLIN HFA) 108 (90 Base) MCG/ACT inhaler Inhale 1 puff into the lungs every 6 (six) hours as needed for wheezing or shortness of breath. 03/11/15  Yes Elvina SidleLauenstein, Kurt, MD  albuterol (PROVENTIL) (2.5 MG/3ML) 0.083% nebulizer solution Take 2.5 mg by nebulization every 6 (six) hours as needed for wheezing.   Yes [provider]  albuterol-ipratropium (COMBIVENT) 18-103 MCG/ACT inhaler Inhale 2 puffs into the lungs every 4 (four) hours. Patient taking differently: Inhale 2 puffs into the lungs every 6 (six) hours as needed for wheezing or shortness of breath.  09/03/14  Yes Elvina SidleLauenstein, Kurt, MD  amLODipine (NORVASC) 10 MG tablet Take 1 tablet (10 mg total) by mouth daily. 04/17/16  Yes Shade FloodGreene, Jeffrey R, MD  aspirin EC 81 MG EC tablet Take 1 tablet (81 mg total) by mouth daily.  10/06/15  Yes Barrett, Erin R, PA-C  atorvastatin (LIPITOR) 80 MG tablet Take 1 tablet (80 mg total) by mouth daily at 6 PM. 04/17/16  Yes Shade FloodGreene, Jeffrey R, MD  calcitRIOL (ROCALTROL) 0.25 MCG capsule Take 1 capsule (0.25 mcg total) by mouth every Monday, Wednesday, and Friday with hemodialysis. Patient taking differently: Take 0.25 mcg by mouth See admin instructions. Tuesday Thursday Saturday 10/06/15  Yes Barrett, Erin R, PA-C  calcium acetate (PHOSLO) 667 MG capsule Take 2 capsules (1,334 mg total) by mouth 3 (three) times daily with meals. 01/08/15  Yes Rolly SalterPatel, Pranav M, MD  clopidogrel (PLAVIX) 75 MG tablet Take 1 tablet (75 mg total) by mouth daily. 01/27/16  Yes Lewayne Buntingrenshaw, Brian S, MD  cyclobenzaprine (FLEXERIL) 10 MG tablet Take 1 tablet (10 mg total) by mouth at bedtime. 05/22/15  Yes Elvina SidleLauenstein, Kurt, MD  gabapentin (NEURONTIN) 100 MG capsule Take 2 capsules (200 mg total) by mouth 3 (three) times daily. Patient taking differently: Take 800 mg by mouth  2 (two) times daily.  05/22/15  Yes [name], MD  HYDROmorphone (DILAUDID) 2 MG tablet Take 2 mg by mouth every 4 (four) hours as needed for severe pain.   Yes [provider]  hydrOXYzine (ATARAX/VISTARIL) 25 MG tablet Take 25 mg by mouth at bedtime.    Yes [provider]  insulin glargine (LANTUS) 100 UNIT/ML injection Inject 0.4 mLs (40 Units total) into the skin 2 (two) times daily. 05/20/16  Yes Dhungel, Nishant, MD  insulin lispro (HUMALOG) 100 UNIT/ML injection Inject 0.15-0.3 mLs (15-30 Units total) into the skin every evening. Take one unit per 5 carbs depending on dinner. 11/29/15  Yes Carlus Pavlov, MD  levothyroxine (SYNTHROID, LEVOTHROID) 50 MCG tablet Take 1 tablet (50 mcg total) by mouth daily. 05/22/15  Yes [name], MD  metoCLOPramide (REGLAN) 5 MG tablet Take 1 tablet (5 mg total) by mouth every 8 (eight) hours as needed for nausea or vomiting. Patient taking differently: Take 5 mg by mouth 2  (two) times daily.  01/10/16  Yes Dong, Robin, DO  metoprolol (LOPRESSOR) 50 MG tablet Take 50 mg by mouth 2 (two) times daily.   Yes [provider]  insulin aspart (NOVOLOG) 100 UNIT/ML injection Inject 5-10 Units into the skin 3 (three) times daily with meals. PER SLIDING SCALE    [provider]     Social History   Social History  . Marital status: Married    Spouse name: N/A  . Number of children: N/A  . Years of education: N/A   Occupational History  .      Maintenance Curator   Social History Main Topics  . Smoking status: Former Smoker    Types: Cigarettes    Quit date: 03/17/2016  . Smokeless tobacco: Never Used  . Alcohol use 0.0 oz/week  . Drug use: No  . Sexual activity: Not Currently   Other Topics Concern  . Not on file   Social History Narrative   Patient currently lives with his fiance   Works with facilities   Nonsmoker, nondrinker       Family Status  Relation Status  . Mother Alive  . Sister Alive  . Brother Alive  . Mat [employer]  . Mat [employer]  . [employer]  . [employer] Alive  . [employer] Alive  . MGF Deceased  . PGM Alive  . PGF Deceased  . Mat [employer]  . Mat [employer]  . Mat [employer]  . Mat [employer]  . Mat [employer]  . Mat [employer]  . [employer]  . [employer] Alive  . [employer] Alive  . Daughter Alive  . Son Alive  . Father Alive  . Unknown (Not Specified)   Family History  Problem Relation Age of Onset  . Obesity Mother        Patient states that family members have no other medical illnesses other than what I have described  . Kidney cancer Maternal Grandmother   . Cancer Father 50       AML  . Heart disease Unknown        No family history   ROS:  Full 14 point review of systems complete and found to be negative unless listed above.  Physical Exam: Blood pressure (!) 160/91, pulse 84, temperature 98.1 F (36.7 C), temperature source Oral, resp. rate 14, height 6\' 2"  (1.88 m),  weight 267 lb (121.1 kg), SpO2 100 %.  General: Well developed, well nourished, male in no acute distress, morbidly obese Head: Eyes PERRLA, No xanthomas.   Normocephalic and atraumatic, oropharynx without edema or exudate.   Lungs: CTAB Heart: HRRR S1 S2, no rub/gallop, Heart regular rate and rhythm with S1, S2 no murmur. pulses are 2+ extrem.   Neck: No carotid bruits. No lymphadenopathy.  No JVD. Abdomen: Bowel sounds present, abdomen soft and non-tender without masses or hernias noted. Msk:  No spine or cva tenderness. No weakness, no joint deformities or effusions. Extremities: No clubbing or cyanosis. Trace bilateral LE edema.  Neuro: Alert and oriented X 3. No focal deficits noted. Psych:  Good affect, responds appropriately Skin: No rashes or lesions noted.  Labs:   Lab Results  Component Value Date   WBC 6.2 05/23/2016   HGB 8.8 (L) 05/23/2016   HCT 27.3 (L) 05/23/2016   MCV 86.9 05/23/2016   PLT 321 05/23/2016   No results for input(s): INR in the last 72 hours.   Recent Labs Lab 05/20/16 0904 05/23/16 1558  NA 132* 136  K 4.1 4.3  CL 94* 97*  CO2 24 29  BUN 40* 38*  CREATININE 8.17* 7.17*  CALCIUM 7.9* 7.8*  GLUCOSE 157* 259*  ALBUMIN 2.8*  --    Magnesium  Date Value Ref Range Status  09/29/2015 1.9 1.7 - 2.4 mg/dL Final   No results for input(s): CKTOTAL, CKMB, TROPONINI in the last 72 hours.  Recent Labs  05/23/16 1607  TROPIPOC 0.01   No results found for: PROBNP Lab Results  Component Value Date   CHOL 196 05/17/2016   HDL 32 (L) 05/17/2016   LDLCALC 115 (H) 05/17/2016   TRIG 246 (H) 05/17/2016   Lab Results  Component Value Date   DDIMER 0.69 (H) 05/22/2013   Lipase  Date/Time Value Ref Range Status  04/27/2016 05:44 PM 37 11 - 51 U/L Final   Amylase  Date/Time Value Ref Range Status  04/03/2014 12:35 PM 32 0 - 105 U/L Final   TSH  Date/Time Value Ref Range Status  07/20/2015 04:27 AM 1.243 0.350 - 4.500 uIU/mL Final    04/10/2014 10:54 AM 3.492 0.350 - 4.500 uIU/mL Final   Vitamin B-12  Date/Time Value Ref Range Status  01/01/2015 11:20 AM 191 180 - 914 pg/mL Final    Comment:    (NOTE) This assay is not validated for testing neonatal or myeloproliferative syndrome specimens for Vitamin B12 levels.    Folate  Date/Time Value Ref Range Status  01/01/2015 11:20 AM 9.9 >5.9 ng/mL Final   Ferritin  Date/Time Value Ref Range Status  05/17/2016 07:11 AM 139 24 - 336 ng/mL Final   TIBC  Date/Time Value Ref Range Status  05/17/2016 07:11 AM 216 (L) 250 - 450 ug/dL Final   Iron  Date/Time Value Ref Range Status  05/17/2016 07:11 AM 17 (L) 45 - 182 ug/dL Final    Echo: 16/10/9602 LV EF: 55% Study Conclusions - Left ventricle: The cavity size was normal. Wall thickness was   increased in a pattern of mild LVH. The estimated ejection   fraction was 55%. Although no diagnostic regional wall motion   abnormality was identified, this possibility cannot be completely   excluded on the basis of this study. Features are consistent with   a pseudonormal left ventricular filling pattern, with concomitant   abnormal relaxation and increased filling pressure (grade 2   diastolic dysfunction). - Aortic valve: There was no stenosis. - Mitral valve: There was  no significant regurgitation. - Right ventricle: Poorly visualized. - Tricuspid valve: Peak RV-RA gradient (S): 40 mm Hg. - Pulmonary arteries: PA peak pressure: 55 mm Hg (S). - Systemic veins: IVC measured 2.5 cm with < 50% respirophasic   variation, suggesting RA pressure 15 mmHg. Impressions: - Technically difficult study with poor acoustic windows. Normal LV   size with mild LV hypertrophy, EF 55%. Moderate diastolic   dysfunction. RV poorly visualized. No significant valvular   abnormalities. Moderate pulmonary hypertension. Dilated IVC   suggestive of elevated RV filling pressure.   ECG:  Sinus with repol changes, old anterior infarct -  personally reviewed  TELE: NSR - personally reviewed  Radiology:  Dg Chest 2 View  Result Date: 05/23/2016 CLINICAL DATA:  Left-sided chest pain with shortness of breath EXAM: CHEST  2 VIEW COMPARISON:  05/16/2016 FINDINGS: Post sternotomy changes. Low lung volumes. No focal consolidation or effusion. Stable cardiomegaly with mild central congestion. No pneumothorax. IMPRESSION: 1. Cardiomegaly with mild central congestion 2. Low lung volume.  No edema or infiltrate. Electronically Signed   By: Jasmine Pang M.D.   On: 05/23/2016 15:57    ASSESSMENT AND PLAN:    Principal Problem:   Chest pain Active Problems:   Diabetic neuropathy (HCC)   Uncontrolled diabetes mellitus type 2 with peripheral artery disease (HCC)   Morbid obesity (HCC)   Hx of gastroesophageal reflux (GERD)   Essential hypertension, benign   Nephrotic syndrome   ESRD on dialysis (HCC)   S/P CABG (coronary artery bypass graft)   Anemia due to end stage renal disease (HCC)  LOVELL NUTTALL is a 46 y.o. male with a history of nephrotic syndrome, HTN, ESRD on peritoneal HD, CAD s/p CABGx3V (09/2015), morbid obesity, DMT2, and DVT who presented to [facility] ED today with chest pain.   Chest pain: atypical and pleuritic and worse with pressing on his chest. Troponin POC is negative. CT angio last week was negative for PE. Possibly related to pericarditis, but pain is better when laying flat. No ECG changes such as ST elevation or PR depression. Will check a limited echo to rule out pericardial effusion.   CAD s/p CABG x3V: chest pain does not sound ischemic. Continue ASA/plavix, statin, and BB  HTN: BP is elevated. Would resume home meds and continue to monitor  Chronic diastolic CHF: he may be mildly volume overload. Weight up 2 lbs since most recent discharge.  BNP is mildly elevated which is non specific with creat of 7. CXR with some mild central congestion. Volume status by nephrology   ESRD on PD: per primary team  Anemia  of chronic disease:   DMT2: continue current regimen    Signed: Cline Crock, PA-C 05/23/2016 6:47 PM  Pager [phone]  Co-Sign MD  Agree with note by Carlean Jews PA-C   Pt recently D/Cd from the hosp on Sat after admission for volume overload getting PD. Now with atyp, pleuritic CP. 2D last week was nl as was CTO. Trop neg. EKG w/o acute changes. Exam benign. No rub. Doubt cardiac etiol. It should be noted that he did have CABG X 3 9/17. Will follow with you. Cycle enzymes. PD with neg I/O.  Runell Gess, M.D., FACP, [facility], [facility] [address] [place], [place]  [address]  [phone] 05/24/2016 7:14 AM

## 2016-05-23 NOTE — ED Provider Notes (Signed)
[facility]-EMERGENCY DEPT Provider Note   CSN: [id number] Arrival date & time: 05/23/16  1444     History   Chief Complaint Chief Complaint  Patient presents with  . Chest Pain    HPI Fonnie BirkenheadSteven B Ciolek is a 46 y.o. male.  The history is provided by the patient.  Chest Pain   This is a new problem. The current episode started yesterday. The problem occurs hourly. The problem has been gradually improving. The pain is associated with rest. The pain is present in the substernal region. The pain is at a severity of 6/10. The pain is moderate. The quality of the pain is described as pressure-like and stabbing. The pain radiates to the left arm. Pertinent negatives include no abdominal pain, no back pain, no cough, no diaphoresis, no exertional chest pressure, no fever, no lower extremity edema, no malaise/fatigue, no nausea, no near-syncope, no palpitations, no shortness of breath, no sputum production, no vomiting and no weakness. He has tried nitroglycerin for the symptoms. Risk factors include male gender.  His past medical history is significant for CAD (s/p CABG) and hypertension.  Pertinent negatives for past medical history include no seizures.    Past Medical History:  Diagnosis Date  . Anxiety   . Asthma   . 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 Depression   . DVT (deep venous thrombosis), H/o 01/2014-on Xarelto 03/24/2014   LLE  . ESRD (end stage renal disease) (HCC)   . Hypertension   . Hypothyroidism   . Nephrotic syndrome 05/18/2014  . Type 2 diabetes mellitus with diabetic nephropathy [facility](HCC)     Patient Active Problem List   Diagnosis Date Noted  . Acute on chronic respiratory failure with hypoxia (HCC) 05/16/2016  . Fluid overload 05/16/2016  . Neutropenia with fever (HCC) 05/16/2016  . Anemia due to end stage renal disease (HCC)  05/16/2016  . Abdominal pain 03/09/2016  . S/P CABG (coronary artery bypass graft) 09/28/2015  . ESRD on dialysis (HCC) 02/13/2015  . Nephrotic syndrome 01/03/2015  . Essential hypertension, benign   . Thyroid activity decreased   . Hx of gastroesophageal reflux (GERD) 09/30/2014  . Acute on chronic renal failure (HCC) 09/24/2014  . Diabetic neuropathy (HCC) 09/21/2014  . Uncontrolled diabetes mellitus type 2 with peripheral artery disease (HCC) 09/21/2014  . Morbid obesity (HCC) 09/21/2014  . Chest pain 04/22/2014  . Superficial thrombophlebitis 03/24/2014    Past Surgical History:  Procedure Laterality Date  . ANKLE FRACTURE SURGERY Right 1988  . AV FISTULA PLACEMENT Left 01/01/2015   Procedure: CREATION OF LEFT RADIAL CEPHALIC ARTERIOVENOUS (AV) FISTULA ;  Surgeon: Pryor OchoaJames D Lawson, MD;  [facility];  Service: Vascular;  Laterality: Left;  . CAPD REMOVAL N/A 05/19/2016   Procedure: PD CATH REMOVAL;  Surgeon: Abigail MiyamotoBlackman, Douglas, MD;  [facility];  Service: General;  Laterality: N/A;  . CARDIAC CATHETERIZATION N/A 09/22/2015   Procedure: Left Heart Cath and Coronary Angiography;  Surgeon: Iran OuchMuhammad A Arida, MD;  Location: [facility] INVASIVE CV LAB;  Service: Cardiovascular;  Laterality: N/A;  . CORONARY ARTERY BYPASS GRAFT N/A 09/28/2015   Procedure: CORONARY ARTERY BYPASS GRAFTING (CABG) x 3 UTILIZING LEFT MAMMARY ARTERY AND ENDOSCOPICALLY HARVESTED LEFT GREATER SAPHENOUS VEIN.;  Surgeon: Delight OvensEdward B Gerhardt, MD;  Location: [facility] OR;  Service: Open Heart Surgery;  Laterality: N/A;  . ENDOVEIN HARVEST  OF GREATER SAPHENOUS VEIN Left 09/28/2015   Procedure: ENDOVEIN HARVEST OF GREATER SAPHENOUS VEIN;  Surgeon: Delight Ovens, MD;  Location: [facility] OR;  Service: Open Heart Surgery;  Laterality: Left;  . ESOPHAGOGASTRODUODENOSCOPY N/A 03/29/2014   Procedure: ESOPHAGOGASTRODUODENOSCOPY (EGD);  Surgeon: Dorena Cookey, MD;  Location: [facility];  Service: Endoscopy;  Laterality: N/A;  . EYE SURGERY    .  FRACTURE SURGERY    . INSERTION OF DIALYSIS CATHETER Right 01/05/2015   Procedure: INSERTION OF RIGHT INTERNAL JUGULAR DIALYSIS CATHETER;  Surgeon: Fransisco Hertz, MD;  Location: [facility] OR;  Service: Vascular;  Laterality: Right;  . TEE WITHOUT CARDIOVERSION N/A 09/28/2015   Procedure: TRANSESOPHAGEAL ECHOCARDIOGRAM (TEE);  Surgeon: Delight Ovens, MD;  Location: [facility] OR;  Service: Open Heart Surgery;  Laterality: N/A;  . TONSILLECTOMY AND ADENOIDECTOMY  1970s       Home Medications    Prior to Admission medications   Medication Sig Start Date End Date Taking? Authorizing Provider  acetaminophen (TYLENOL) 500 MG tablet Take 1 tablet (500 mg total) by mouth every 6 (six) hours as needed for moderate pain. Patient taking differently: Take 1,000 mg by mouth every 6 (six) hours as needed (for pain).  09/29/14  Yes Albertine Grates, MD  albuterol (PROVENTIL HFA;VENTOLIN HFA) 108 (90 Base) MCG/ACT inhaler Inhale 1 puff into the lungs every 6 (six) hours as needed for wheezing or shortness of breath. 03/11/15  Yes [name], MD  albuterol (PROVENTIL) (2.5 MG/3ML) 0.083% nebulizer solution Take 2.5 mg by nebulization every 6 (six) hours as needed for wheezing.   Yes [provider]  albuterol-ipratropium (COMBIVENT) 18-103 MCG/ACT inhaler Inhale 2 puffs into the lungs every 4 (four) hours. Patient taking differently: Inhale 2 puffs into the lungs every 6 (six) hours as needed for wheezing or shortness of breath.  09/03/14  Yes [name], MD  amLODipine (NORVASC) 10 MG tablet Take 1 tablet (10 mg total) by mouth daily. 04/17/16  Yes Shade Flood, MD  aspirin EC 81 MG EC tablet Take 1 tablet (81 mg total) by mouth daily. 10/06/15  Yes Barrett, Erin R, PA-C  atorvastatin (LIPITOR) 80 MG tablet Take 1 tablet (80 mg total) by mouth daily at 6 PM. 04/17/16  Yes Shade Flood, MD  calcitRIOL (ROCALTROL) 0.25 MCG capsule Take 1 capsule (0.25 mcg total) by mouth every Monday, Wednesday, and Friday  with hemodialysis. Patient taking differently: Take 0.25 mcg by mouth See admin instructions. Tuesday Thursday Saturday 10/06/15  Yes Barrett, Erin R, PA-C  calcium acetate (PHOSLO) 667 MG capsule Take 2 capsules (1,334 mg total) by mouth 3 (three) times daily with meals. 01/08/15  Yes Rolly Salter, MD  clopidogrel (PLAVIX) 75 MG tablet Take 1 tablet (75 mg total) by mouth daily. 01/27/16  Yes Lewayne Bunting, MD  cyclobenzaprine (FLEXERIL) 10 MG tablet Take 1 tablet (10 mg total) by mouth at bedtime. 05/22/15  Yes [name], MD  gabapentin (NEURONTIN) 100 MG capsule Take 2 capsules (200 mg total) by mouth 3 (three) times daily. Patient taking differently: Take 800 mg by mouth 2 (two) times daily.  05/22/15  Yes [name], MD  HYDROmorphone (DILAUDID) 2 MG tablet Take 2 mg by mouth every 4 (four) hours as needed for severe pain.   Yes [provider]  hydrOXYzine (ATARAX/VISTARIL) 25 MG tablet Take 25 mg by mouth at bedtime.    Yes [provider]  insulin glargine (LANTUS) 100 UNIT/ML injection Inject 0.4 mLs (40 Units  total) into the skin 2 (two) times daily. 05/20/16  Yes Dhungel, Nishant, MD  insulin lispro (HUMALOG) 100 UNIT/ML injection Inject 0.15-0.3 mLs (15-30 Units total) into the skin every evening. Take one unit per 5 carbs depending on dinner. 11/29/15  Yes Carlus Pavlov, MD  levothyroxine (SYNTHROID, LEVOTHROID) 50 MCG tablet Take 1 tablet (50 mcg total) by mouth daily. 05/22/15  Yes [name], MD  metoCLOPramide (REGLAN) 5 MG tablet Take 1 tablet (5 mg total) by mouth every 8 (eight) hours as needed for nausea or vomiting. Patient taking differently: Take 5 mg by mouth 2 (two) times daily.  01/10/16  Yes Dong, Robin, DO  metoprolol (LOPRESSOR) 50 MG tablet Take 50 mg by mouth 2 (two) times daily.   Yes [provider]    Family History Family History  Problem Relation Age of Onset  . Obesity Mother        Patient states that family  members have no other medical illnesses other than what I have described  . Kidney cancer Maternal Grandmother   . Cancer Father 50       AML  . Heart disease Unknown        No family history    Social History Social History  Substance Use Topics  . Smoking status: Former Smoker    Types: Cigarettes    Quit date: 03/17/2016  . Smokeless tobacco: Never Used  . Alcohol use 0.0 oz/week     Allergies   Ibuprofen; Nsaids; and Tape   Review of Systems Review of Systems  Constitutional: Negative for chills, diaphoresis, fever and malaise/fatigue.  HENT: Negative for ear pain and sore throat.   Eyes: Negative for pain and visual disturbance.  Respiratory: Negative for cough, sputum production and shortness of breath.   Cardiovascular: Positive for chest pain. Negative for palpitations and near-syncope.  Gastrointestinal: Negative for abdominal pain, nausea and vomiting.  Genitourinary: Negative for dysuria and hematuria.  Musculoskeletal: Negative for arthralgias and back pain.  Skin: Negative for color change and rash.  Neurological: Negative for seizures, syncope and weakness.  All other systems reviewed and are negative.    Physical Exam Updated Vital Signs  ED Triage Vitals  Enc Vitals Group     BP 05/23/16 1500 136/86     Pulse Rate 05/23/16 1500 89     Resp 05/23/16 1500 18     Temp 05/23/16 1549 98.1 F (36.7 C)     Temp Source 05/23/16 1549 Oral     SpO2 05/23/16 1500 98 %     Weight 05/23/16 1601 267 lb (121.1 kg)     Height 05/23/16 1601 6\' 2"  (1.88 m)     Head Circumference --      Peak Flow --      Pain Score 05/23/16 1448 8     Pain Loc --      Pain Edu? --      Excl. in GC? --     Physical Exam  Constitutional: He is oriented to person, place, and time. He appears well-developed and well-nourished.  HENT:  Head: Normocephalic and atraumatic.  Eyes: Conjunctivae and EOM are normal. Pupils are equal, round, and reactive to light.  Neck: Normal  range of motion. Neck supple.  Cardiovascular: Normal rate, regular rhythm, normal heart sounds and intact distal pulses.   No murmur heard. Pulmonary/Chest: Effort normal and breath sounds normal. No respiratory distress.  Abdominal: Soft. Bowel sounds are normal. He exhibits no distension. There is no  tenderness.  Musculoskeletal: Normal range of motion. He exhibits no edema.  Neurological: He is alert and oriented to person, place, and time.  Skin: Skin is warm and dry. Capillary refill takes less than 2 seconds.  Psychiatric: He has a normal mood and affect.  Nursing note and vitals reviewed.    ED Treatments / Results  Labs (all labs ordered are listed, but only abnormal results are displayed) Labs Reviewed  BASIC METABOLIC PANEL - Abnormal; Notable for the following:       Result Value   Chloride 97 (*)    Glucose, Bld 259 (*)    BUN 38 (*)    Creatinine, Ser 7.17 (*)    Calcium 7.8 (*)    GFR calc non Af Amer 8 (*)    GFR calc Af Amer 10 (*)    All other components within normal limits  CBC - Abnormal; Notable for the following:    RBC 3.14 (*)    Hemoglobin 8.8 (*)    HCT 27.3 (*)    RDW 16.1 (*)    All other components within normal limits  BRAIN NATRIURETIC PEPTIDE - Abnormal; Notable for the following:    B Natriuretic Peptide 299.0 (*)    All other components within normal limits  GLUCOSE, CAPILLARY - Abnormal; Notable for the following:    Glucose-Capillary 128 (*)    All other components within normal limits  TROPONIN I  TROPONIN I  TROPONIN I  BASIC METABOLIC PANEL  CBC  I-STAT TROPOININ, ED    EKG  EKG Interpretation  Date/Time:  Tuesday May 23 2016 14:51:52 EDT Ventricular Rate:  91 PR Interval:    QRS Duration: 84 QT Interval:  393 QTC Calculation: 484 R Axis:   3 Text Interpretation:  Sinus rhythm Anteroseptal infarct, old Nonspecific T abnormalities, lateral leads No acute changes No significant change since last tracing Confirmed by  Derwood Kaplan [phone]) on 05/23/2016 4:19:31 PM       Radiology Dg Chest 2 View  Result Date: 05/23/2016 CLINICAL DATA:  Left-sided chest pain with shortness of breath EXAM: CHEST  2 VIEW COMPARISON:  05/16/2016 FINDINGS: Post sternotomy changes. Low lung volumes. No focal consolidation or effusion. Stable cardiomegaly with mild central congestion. No pneumothorax. IMPRESSION: 1. Cardiomegaly with mild central congestion 2. Low lung volume.  No edema or infiltrate. Electronically Signed   By: Jasmine Pang M.D.   On: 05/23/2016 15:57    Procedures Procedures (including critical care time)  Medications Ordered in ED Medications  pentafluoroprop-tetrafluoroeth (GEBAUERS) aerosol 1 application (not administered)  lidocaine (PF) (XYLOCAINE) 1 % injection 5 mL (not administered)  lidocaine-prilocaine (EMLA) cream 1 application (not administered)  0.9 %  sodium chloride infusion (not administered)  0.9 %  sodium chloride infusion (not administered)  heparin injection 1,000 Units (not administered)  alteplase (CATHFLO ACTIVASE) injection 2 mg (not administered)  heparin injection 4,800 Units (not administered)  calcitRIOL (ROCALTROL) capsule 0.25 mcg (not administered)  HYDROmorphone (DILAUDID) tablet 2 mg (not administered)  metoprolol tartrate (LOPRESSOR) tablet 50 mg (50 mg Oral Given 05/23/16 2123)  amLODipine (NORVASC) tablet 10 mg (not administered)  atorvastatin (LIPITOR) tablet 80 mg (not administered)  albuterol (PROVENTIL) (2.5 MG/3ML) 0.083% nebulizer solution 2.5 mg (not administered)  clopidogrel (PLAVIX) tablet 75 mg (not administered)  metoCLOPramide (REGLAN) tablet 5 mg (not administered)  aspirin EC tablet 81 mg (not administered)  hydrOXYzine (ATARAX/VISTARIL) tablet 25 mg (25 mg Oral Given 05/23/16 2123)  cyclobenzaprine (FLEXERIL) tablet 10 mg (10  mg Oral Given 05/23/16 2123)  gabapentin (NEURONTIN) capsule 800 mg (800 mg Oral Given 05/23/16 2123)  levothyroxine  (SYNTHROID, LEVOTHROID) tablet 50 mcg (not administered)  calcium acetate (PHOSLO) capsule 1,334 mg (not administered)  nitroGLYCERIN (NITROSTAT) SL tablet 0.4 mg (0.4 mg Sublingual Given 05/23/16 2104)  acetaminophen (TYLENOL) tablet 650 mg (not administered)  ondansetron (ZOFRAN) injection 4 mg (not administered)  heparin injection 5,000 Units (5,000 Units Subcutaneous Not Given 05/23/16 2124)  insulin glargine (LANTUS) injection 40 Units (40 Units Subcutaneous Given 05/23/16 2209)  insulin aspart (novoLOG) injection 0-5 Units (0 Units Subcutaneous Not Given 05/23/16 2124)  insulin aspart (novoLOG) injection 10 Units (not administered)  insulin aspart (novoLOG) injection 0-15 Units (not administered)  insulin aspart (novoLOG) injection 7 Units (7 Units Subcutaneous Not Given 05/23/16 2124)  HYDROcodone-acetaminophen (NORCO/VICODIN) 5-325 MG per tablet 1 tablet (1 tablet Oral Given 05/23/16 2123)     Initial Impression / Assessment and Plan / ED Course  I have reviewed the triage vital signs and the nursing notes.  Pertinent labs & imaging results that were available during my care of the patient were reviewed by me and considered in my medical decision making (see chart for details).     DANTA BAUMGARDNER is a 46 year old male with history of end-stage renal disease on hemodialysis, CAD status post CABG last year, hypertension who presents to the ED with chest pain. Patient's vitals at time of arrival to the ED are unremarkable and patient is without fever. Patient with EKG done in triage that shows normal sinus rhythm with no signs of acute ST changes and overall unremarkable. Patient with chest pain that started last night that has been ongoing with minimal relief with nitroglycerin. Patient ready given 324 mg of aspirin with EMS. Patient was recently admitted last week for volume overload. Had a negative PE study at that time. Patient has had history of DVT but no signs to suggest DVT or PE at  this time. Patient otherwise with unremarkable exam. Concern for ACS given recent CABG history. Patient denies infectious symptoms. No signs of obvious volume overload on exam. Patient only partially completed dialysis today because chest pain got worse during dialysis. Labs ordered including troponin, chest x-ray.  Patient with no signs of pneumonia, pleural effusion, pulmonary edema. Patient with first troponin within normal limits. No significant anemia or electrolyte abnormalities on lab work. BNP slightly elevated at 299. Cardiology consulted and came down to the ED to evaluate the patient. They will likely due echocardiogram in patient and trend troponins. Patient admitted to medicine service and transferred to the floor in stable condition.  Final Clinical Impressions(s) / ED Diagnoses   Final diagnoses:  Chest pain, unspecified type    New Prescriptions Current Discharge Medication List       Virgina Norfolk, DO 05/23/16 2257    Derwood Kaplan, MD 05/25/16 0111

## 2016-05-23 NOTE — ED Triage Notes (Signed)
Pt [place] via [facility] [place] [facility] [place] with c/o chest pain that started last night and became worse during dialysis.  Per EMS pt's 12 lead was unremarkable.  324 of aspirin and 2 nitro sprays were given by EMS pta.  Pt has a 20 gauge in right hand.  CBG: 306 Left forearm dialysis shunts remain accessed.

## 2016-05-23 NOTE — Consult Note (Signed)
Reason for Consult: Continuity of ESRD care Referring Physician: Derwood Kaplan M.D. (ED)  HPI:  46 year old Caucasian man with past medical history significant for end-stage renal disease previously on peritoneal dialysis and transition over to hemodialysis last week, prior history of type 2 diabetes mellitus, hypertension, hypothyroidism, coronary artery disease status post CABG in September 2017 and anxiety who presents with worsening substernal chest pain after dialysis. He reports that the chest pain started on Sunday but he decided to ignore it for a while because he had just left the hospital on Friday of last week. Today, when seen by the rounding provider at his dialysis unit in chest pain-he was instructed to come to the emergency room. He denies any diaphoresis with the chest pain but reports radiation of the pain to his neck. His wife reports that he has had recent difficulties in blood pressure control.  He has some mild shortness of breath that has improved considerably since being started on hemodialysis. He has some residual lower extremity edema that is also improved on hemodialysis. He denies any fever or chills and denies any cough or sputum production. He has mild nausea without any vomiting or diarrhea.  Hemodialysis prescription: [facility] Tuesday, Thursday, Saturday, 4 hours, 180 dialyzer, BFR 400/DFR800, left radiocephalic fistula, EDW 115 kg, 1O/1.0 calcium, no UF profile, no sodium modeling. Calcitriol 0.25 g every dialysis, Venofer 100 mg every dialysis, Mircera 100 g every 2 weeks  Past Medical History:  Diagnosis Date  . Age-related macular degeneration, wet, both eyes (HCC)    "I'm getting Alia treatments" (12/30/2014)  . Anxiety   . Asthma   . 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 Coronary artery disease involving native coronary artery of native heart with unstable angina pectoris (HCC)    80% LAD-95% oD1 bifurcation lesion & 90% RI  --> referred for CABG  . Daily headache   . Depression   . DVT (deep venous thrombosis), H/o 01/2014-on Xarelto 03/24/2014   LLE  . Hypertension   . Hypothyroidism   . Nephrotic syndrome 05/18/2014  . NSTEMI (non-ST elevated myocardial infarction) (HCC)   . Pneumonia 11/2013  . Renal insufficiency   . Secondary DM with DKA-AG=16, BIcarb Nl 03/24/2014  . Type 2 diabetes mellitus with diabetic nephropathy [facility])     Past Surgical History:  Procedure Laterality Date  . ANKLE FRACTURE SURGERY Right 1988  . AV FISTULA PLACEMENT Left 01/01/2015   Procedure: CREATION OF LEFT RADIAL CEPHALIC ARTERIOVENOUS (AV) FISTULA ;  Surgeon: Pryor Ochoa, MD;  Location: [facility] OR;  Service: Vascular;  Laterality: Left;  . CAPD REMOVAL N/A 05/19/2016   Procedure: PD CATH REMOVAL;  Surgeon: Abigail Miyamoto, MD;  Location: [facility] OR;  Service: General;  Laterality: N/A;  . CARDIAC CATHETERIZATION N/A 09/22/2015   Procedure: Left Heart Cath and Coronary Angiography;  Surgeon: Iran Ouch, MD;  Location: [facility] INVASIVE CV LAB;  Service: Cardiovascular;  Laterality: N/A;  . CORONARY ARTERY BYPASS GRAFT N/A 09/28/2015   Procedure: CORONARY ARTERY BYPASS GRAFTING (CABG) x 3 UTILIZING LEFT MAMMARY ARTERY AND ENDOSCOPICALLY HARVESTED LEFT GREATER SAPHENOUS VEIN.;  Surgeon: Delight Ovens, MD;  Location: [facility] OR;  Service: Open Heart Surgery;  Laterality: N/A;  . ENDOVEIN HARVEST OF GREATER SAPHENOUS VEIN Left 09/28/2015   Procedure: ENDOVEIN HARVEST OF GREATER SAPHENOUS VEIN;  Surgeon: Delight Ovens, MD;  Location: [facility] OR;  Service: Open Heart Surgery;  Laterality: Left;  . ESOPHAGOGASTRODUODENOSCOPY N/A 03/29/2014  Procedure: ESOPHAGOGASTRODUODENOSCOPY (EGD);  Surgeon: Dorena Cookey, MD;  Location: [facility];  Service: Endoscopy;  Laterality: N/A;  . EYE SURGERY    . FRACTURE SURGERY    . INSERTION OF DIALYSIS CATHETER Right 01/05/2015   Procedure: INSERTION OF RIGHT INTERNAL JUGULAR DIALYSIS CATHETER;  Surgeon: Fransisco Hertz, MD;  Location: [facility] OR;  Service: Vascular;  Laterality: Right;  . TEE WITHOUT CARDIOVERSION N/A 09/28/2015   Procedure: TRANSESOPHAGEAL ECHOCARDIOGRAM (TEE);  Surgeon: Delight Ovens, MD;  Location: [facility] OR;  Service: Open Heart Surgery;  Laterality: N/A;  . TONSILLECTOMY AND ADENOIDECTOMY  1970s    Family History  Problem Relation Age of Onset  . Obesity Mother        Patient states that family members have no other medical illnesses other than what I have described  . Kidney cancer Maternal Grandmother   . Cancer Father 50       AML  . Heart disease Unknown        No family history    Social History:  reports that he quit smoking about 2 months ago. His smoking use included Cigarettes. He has never used smokeless tobacco. He reports that he drinks alcohol. He reports that he does not use drugs.  Allergies:  Allergies  Allergen Reactions  . Ibuprofen Other (See Comments)    MD told patient not to take due to kidney disease  . Nsaids Other (See Comments)    Told to avoid all nsaids due to kidney disease   . Tape Other (See Comments)    Welts result, if left for a long amount of time    Medications: Prior to Admission:  (Not in a hospital admission)  BMP Latest Ref Rng & Units 05/23/2016 05/20/2016 05/19/2016  Glucose 65 - 99 mg/dL 161(W) 960(A) 540(J)  BUN 6 - 20 mg/dL 81(X) 91(Y) 78(G)  Creatinine 0.61 - 1.24 mg/dL 9.56(O) 1.30(Q) 6.57(Q)  Sodium 135 - 145 mmol/L 136 132(L) 136  Potassium 3.5 - 5.1 mmol/L 4.3 4.1 4.2  Chloride 101 - 111 mmol/L 97(L) 94(L) 97(L)  CO2 22 - 32 mmol/L 29 24 28   Calcium 8.9 - 10.3 mg/dL 7.8(L) 7.9(L) 7.8(L)   CBC Latest Ref Rng & Units 05/23/2016 05/20/2016 05/19/2016  WBC 4.0 - 10.5 K/uL 6.2 7.6 7.8  Hemoglobin 13.0 - 17.0 g/dL 4.6(N) 6.2(X) 5.2(W)  Hematocrit 39.0 - 52.0 % 27.3(L) 29.8(L) 30.4(L)  Platelets 150 - 400 K/uL 321 314 324     Dg Chest 2 View  Result Date: 05/23/2016 CLINICAL DATA:  Left-sided chest pain with shortness of  breath EXAM: CHEST  2 VIEW COMPARISON:  05/16/2016 FINDINGS: Post sternotomy changes. Low lung volumes. No focal consolidation or effusion. Stable cardiomegaly with mild central congestion. No pneumothorax. IMPRESSION: 1. Cardiomegaly with mild central congestion 2. Low lung volume.  No edema or infiltrate. Electronically Signed   By: Jasmine Pang M.D.   On: 05/23/2016 15:57    Review of Systems  Constitutional: Positive for malaise/fatigue. Negative for chills, fever and weight loss.  HENT: Negative.   Eyes: Negative.   Respiratory: Positive for shortness of breath. Negative for cough, hemoptysis and sputum production.   Cardiovascular: Positive for chest pain. Negative for palpitations, orthopnea and leg swelling.  Gastrointestinal: Positive for nausea. Negative for abdominal pain, diarrhea, heartburn and vomiting.  Genitourinary: Negative.   Musculoskeletal: Negative.   Skin: Negative.   Neurological: Negative.    Blood pressure (!) 143/89, pulse 85, temperature 98.1 F (36.7 C), temperature source  Oral, resp. rate 15, height 6\' 2"  (1.88 m), weight 121.1 kg (267 lb), SpO2 100 %. Physical Exam  Nursing note and vitals reviewed. Constitutional: He is oriented to person, place, and time. He appears well-developed and well-nourished. No distress.  HENT:  Head: Normocephalic and atraumatic.  Nose: Nose normal.  Mouth/Throat: Oropharynx is clear and moist.  Eyes: Conjunctivae and EOM are normal. Pupils are equal, round, and reactive to light. No scleral icterus.  Neck: Normal range of motion. Neck supple. No JVD present.  Cardiovascular: Normal rate, regular rhythm and normal heart sounds.  Exam reveals no friction rub.   No murmur heard. Respiratory: Effort normal and breath sounds normal. He has no wheezes. He has no rales.  GI: Soft. Bowel sounds are normal. He exhibits distension. There is no tenderness. There is no rebound.  Musculoskeletal: He exhibits edema.  Trace edema over  lower extremities. Left radiocephalic fistula with good thrill and bruit  Neurological: He is alert and oriented to person, place, and time. No cranial nerve deficit.  Skin: Skin is warm and dry. No rash noted. No erythema.    Assessment/Plan: 1. Substernal chest pain: Prior history of coronary artery disease status post CABG noted-initial troponin level 0.01. Anticipate admission overnight for cycling of troponin/cardiac enzymes and serial EKG/telemetry monitoring. 2. End-stage renal disease: He underwent hemodialysis earlier today but clearly remains over his estimated dry weight-will plan for hemodialysis again tomorrow to help alleviate his volume excess that may indeed be contributing to his chest pain. 3. Anemia of chronic kidney disease: Status post MRSA as an outpatient, resume intravenous iron with regular hemodialysis. 4. Secondary hyperparathyroidism: Resume phosphorus binders with meals 3 times a day and resume calcitriol with dialysis. 5. Hypertension: Restarted back on oral anti-hypertensive agents and monitor with ultrafiltration and hemodialysis. I suspect that his volume excess is the key driver in his elevated blood pressures and likely fueled by recent chest pain.  Brittini Brubeck K. 05/23/2016, 5:42 PM

## 2016-05-23 NOTE — H&P (Signed)
History and Physical    Russell [name] VWU:[id number] DOB: [birth year] DOA: 05/23/2016  PCP: Shade Flood, MD   Patient coming from: Home  Chief Complaint: Chest pain  HPI: Russell [name] is a 46 y.o. male with medical history significant for hypertension, insulin-dependent diabetes mellitus, hypothyroidism, CAD, and ESRD on HD TTS, now presenting to the emergency department for evaluation of chest pain. Patient reports that he had been in his usual state of health until the insidious development of pain in the central chest approximately 2 days ago. Since that time, pain is becoming more severe and more constant. He describes it as moderate to severe in intensity, sharp in character, constant but waxing and waning in intensity, associated with mild dyspnea, and with no alleviating or exacerbating factors identified. He denies any associated nausea or diaphoresis, and notes some radiation of the pain to the left lateral chest wall. The patient denies any recent fevers or chills and denies any significant cough. He reports improved lower extremity edema and denies any significant orthopnea. Denies any recent trauma. He denies experiencing similar symptoms previously. He reports experiencing a different type of chest pain previously, but not since his CABG in September 2017. He had undergone CTA chest earlier this month which was negative for PE. He went to dialysis today and completed his session, but reports that the chest pain began to worsen with dialysis and so he came into the ED with EMS afterwards. He was treated with 324 mg aspirin and 2 doses of sublingual nitroglycerin en route, but reports no significant improvement in his symptoms with this.  ED Course: Upon arrival to the ED, patient is found to be afebrile, saturating well on room air, and with vital signs stable. EKG features a sinus rhythm with T-wave inversions in leads I and aVL which appear unchanged from prior. Chest x-ray is  notable for cardiomegaly with mild central congestion and low lung volumes without edema or infiltrate. Chemistry panel is notable for a glucose of 259 and BUN of 38. CBC features a stable normocytic anemia with hemoglobin of 8.8. Troponin is within the normal limits at 0.01 and BNP is elevated to 299. Cardiology was consulted by the ED physician and evaluated the patient in the emergency department, noting that they will see the patient in consultation, but advised medical admission. Nephrology has also been consulted and evaluated the patient in the ED, planning for repeat HD tomorrow as patient remains over his dry weight. Patient continued to have some mild chest pain in the ED, but was hemodynamically stable and with no apparent respiratory distress. He will be observed on the stepdown unit for ongoing evaluation and management of chest pain with history of CAD, but with initial evaluation reassuring.  Review of Systems:  All other systems reviewed and apart from HPI, are negative.  Past Medical History:  Diagnosis Date  . Anxiety   . Asthma   . 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 Depression   . DVT (deep venous thrombosis), H/o 01/2014-on Xarelto 03/24/2014   LLE  . ESRD (end stage renal disease) (HCC)   . Hypertension   . Hypothyroidism   . Nephrotic syndrome 05/18/2014  . Type 2 diabetes mellitus with diabetic nephropathy [facility])     Past Surgical History:  Procedure Laterality Date  . ANKLE FRACTURE SURGERY Right  1988  . AV FISTULA PLACEMENT Left 01/01/2015   Procedure: CREATION OF LEFT RADIAL CEPHALIC ARTERIOVENOUS (AV) FISTULA ;  Surgeon: Pryor OchoaJames D Lawson, MD;  [facility];  Service: Vascular;  Laterality: Left;  . CAPD REMOVAL N/A 05/19/2016   Procedure: PD CATH REMOVAL;  Surgeon: Abigail MiyamotoBlackman, Douglas, MD;  [facility];  Service: General;   Laterality: N/A;  . CARDIAC CATHETERIZATION N/A 09/22/2015   Procedure: Left Heart Cath and Coronary Angiography;  Surgeon: Iran OuchMuhammad A Arida, MD;  Location: [facility] INVASIVE CV LAB;  Service: Cardiovascular;  Laterality: N/A;  . CORONARY ARTERY BYPASS GRAFT N/A 09/28/2015   Procedure: CORONARY ARTERY BYPASS GRAFTING (CABG) x 3 UTILIZING LEFT MAMMARY ARTERY AND ENDOSCOPICALLY HARVESTED LEFT GREATER SAPHENOUS VEIN.;  Surgeon: Delight OvensEdward B Gerhardt, MD;  Location: [facility] OR;  Service: Open Heart Surgery;  Laterality: N/A;  . ENDOVEIN HARVEST OF GREATER SAPHENOUS VEIN Left 09/28/2015   Procedure: ENDOVEIN HARVEST OF GREATER SAPHENOUS VEIN;  Surgeon: Delight OvensEdward B Gerhardt, MD;  [facility];  Service: Open Heart Surgery;  Laterality: Left;  . ESOPHAGOGASTRODUODENOSCOPY N/A 03/29/2014   Procedure: ESOPHAGOGASTRODUODENOSCOPY (EGD);  Surgeon: Dorena CookeyJohn Hayes, MD;  [facility];  Service: Endoscopy;  Laterality: N/A;  . EYE SURGERY    . FRACTURE SURGERY    . INSERTION OF DIALYSIS CATHETER Right 01/05/2015   Procedure: INSERTION OF RIGHT INTERNAL JUGULAR DIALYSIS CATHETER;  Surgeon: Fransisco HertzBrian L Chen, MD;  [facility];  Service: Vascular;  Laterality: Right;  . TEE WITHOUT CARDIOVERSION N/A 09/28/2015   Procedure: TRANSESOPHAGEAL ECHOCARDIOGRAM (TEE);  Surgeon: Delight OvensEdward B Gerhardt, MD;  [facility];  Service: Open Heart Surgery;  Laterality: N/A;  . TONSILLECTOMY AND ADENOIDECTOMY  1970s     reports that he quit smoking about 2 months ago. His smoking use included Cigarettes. He has never used smokeless tobacco. He reports that he drinks alcohol. He reports that he does not use drugs.  Allergies  Allergen Reactions  . Ibuprofen Other (See Comments)    MD told patient not to take due to kidney disease  . Nsaids Other (See Comments)    Told to avoid all nsaids due to kidney disease   . Tape Other (See Comments)    Welts result, if left for a long amount of time    Family History  Problem Relation Age of Onset    . Obesity Mother        Patient states that family members have no other medical illnesses other than what I have described  . Kidney cancer Maternal Grandmother   . Cancer Father 50       AML  . Heart disease Unknown        No family history     Prior to Admission medications   Medication Sig Start Date End Date Taking? Authorizing Provider  acetaminophen (TYLENOL) 500 MG tablet Take 1 tablet (500 mg total) by mouth every 6 (six) hours as needed for moderate pain. Patient taking differently: Take 1,000 mg by mouth every 6 (six) hours as needed (for pain).  09/29/14  Yes Albertine GratesXu, Fang, MD  albuterol (PROVENTIL HFA;VENTOLIN HFA) 108 (90 Base) MCG/ACT inhaler Inhale 1 puff into the lungs every 6 (six) hours as needed for wheezing or shortness of breath. 03/11/15  Yes Elvina SidleLauenstein, Kurt, MD  albuterol (PROVENTIL) (2.5 MG/3ML) 0.083% nebulizer solution Take 2.5 mg by nebulization every 6 (six) hours as needed for wheezing.   Yes [provider]  albuterol-ipratropium (COMBIVENT) 18-103 MCG/ACT inhaler Inhale 2 puffs into  the lungs every 4 (four) hours. Patient taking differently: Inhale 2 puffs into the lungs every 6 (six) hours as needed for wheezing or shortness of breath.  09/03/14  Yes [name], MD  amLODipine (NORVASC) 10 MG tablet Take 1 tablet (10 mg total) by mouth daily. 04/17/16  Yes Shade Flood, MD  aspirin EC 81 MG EC tablet Take 1 tablet (81 mg total) by mouth daily. 10/06/15  Yes Barrett, Erin R, PA-C  atorvastatin (LIPITOR) 80 MG tablet Take 1 tablet (80 mg total) by mouth daily at 6 PM. 04/17/16  Yes Shade Flood, MD  calcitRIOL (ROCALTROL) 0.25 MCG capsule Take 1 capsule (0.25 mcg total) by mouth every Monday, Wednesday, and Friday with hemodialysis. Patient taking differently: Take 0.25 mcg by mouth See admin instructions. Tuesday Thursday Saturday 10/06/15  Yes Barrett, Erin R, PA-C  calcium acetate (PHOSLO) 667 MG capsule Take 2 capsules (1,334 mg total) by mouth  3 (three) times daily with meals. 01/08/15  Yes Rolly Salter, MD  clopidogrel (PLAVIX) 75 MG tablet Take 1 tablet (75 mg total) by mouth daily. 01/27/16  Yes Lewayne Bunting, MD  cyclobenzaprine (FLEXERIL) 10 MG tablet Take 1 tablet (10 mg total) by mouth at bedtime. 05/22/15  Yes [name], MD  gabapentin (NEURONTIN) 100 MG capsule Take 2 capsules (200 mg total) by mouth 3 (three) times daily. Patient taking differently: Take 800 mg by mouth 2 (two) times daily.  05/22/15  Yes [name], MD  HYDROmorphone (DILAUDID) 2 MG tablet Take 2 mg by mouth every 4 (four) hours as needed for severe pain.   Yes [provider]  hydrOXYzine (ATARAX/VISTARIL) 25 MG tablet Take 25 mg by mouth at bedtime.    Yes [provider]  insulin glargine (LANTUS) 100 UNIT/ML injection Inject 0.4 mLs (40 Units total) into the skin 2 (two) times daily. 05/20/16  Yes Dhungel, Nishant, MD  insulin lispro (HUMALOG) 100 UNIT/ML injection Inject 0.15-0.3 mLs (15-30 Units total) into the skin every evening. Take one unit per 5 carbs depending on dinner. 11/29/15  Yes Carlus Pavlov, MD  levothyroxine (SYNTHROID, LEVOTHROID) 50 MCG tablet Take 1 tablet (50 mcg total) by mouth daily. 05/22/15  Yes [name], MD  metoCLOPramide (REGLAN) 5 MG tablet Take 1 tablet (5 mg total) by mouth every 8 (eight) hours as needed for nausea or vomiting. Patient taking differently: Take 5 mg by mouth 2 (two) times daily.  01/10/16  Yes Dong, Robin, DO  metoprolol (LOPRESSOR) 50 MG tablet Take 50 mg by mouth 2 (two) times daily.   Yes [provider]    Physical Exam: Vitals:   05/23/16 1715 05/23/16 1730 05/23/16 1745 05/23/16 1830  BP: (!) 143/89 (!) 147/90 (!) 145/76 (!) 160/91  Pulse: 85 82 84 84  Resp: 15 16 (!) 25 14  Temp:      TempSrc:      SpO2: 100% 100% 99% 100%  Weight:      Height:          Constitutional: No respiratory distress. Obese; in apparent discomfort.  Eyes: PERTLA,  lids and conjunctivae normal ENMT: Mucous membranes are moist. Posterior pharynx clear of any exudate or lesions.   Neck: normal, supple, no masses, no thyromegaly Respiratory: clear to auscultation bilaterally, no wheezing, no crackles. Normal respiratory effort.  Cardiovascular: S1 & S2 heard, regular rate and rhythm. Trace pretibial edema bilaterally. JVP not well-visualized. Abdomen: No distension, soft, no tenderness, no masses palpated. Bowel sounds normal.  Musculoskeletal: no clubbing / cyanosis. No joint deformity upper and lower extremities.  Skin: no significant rashes, lesions, ulcers. Warm, dry, well-perfused. Neurologic: CN 2-12 grossly intact. Sensation intact, DTR normal. Strength 5/5 in all 4 limbs.  Psychiatric: Alert and oriented x 3. Calm and cooperative.     Labs on Admission: I have personally reviewed following labs and imaging studies  CBC:  Recent Labs Lab 05/17/16 1141 05/19/16 1544 05/20/16 0903 05/23/16 1558  WBC 7.2 7.8 7.6 6.2  HGB 8.1* 9.5* 9.2* 8.8*  HCT 25.8* 30.4* 29.8* 27.3*  MCV 86.9 88.1 87.9 86.9  PLT 268 324 314 321   Basic Metabolic Panel:  Recent Labs Lab 05/16/16 2018 05/17/16 1141 05/18/16 0357 05/19/16 0558 05/20/16 0904 05/23/16 1558  NA  --  137 136 136 132* 136  K  --  4.7 4.3 4.2 4.1 4.3  CL  --  99* 95* 97* 94* 97*  CO2  --  23 30 28 24 29   GLUCOSE  --  278* 256* 232* 157* 259*  BUN  --  59* 31* 23* 40* 38*  CREATININE  --  8.64* 5.81* 5.34* 8.17* 7.17*  CALCIUM  --  6.9* 7.4* 7.8* 7.9* 7.8*  PHOS 10.2* 8.7* 6.0* 5.6* 7.9*  --    GFR: Estimated Creatinine Clearance: 18 mL/min (A) (by C-G formula based on SCr of 7.17 mg/dL (H)). Liver Function Tests:  Recent Labs Lab 05/17/16 1141 05/18/16 0357 05/19/16 0558 05/20/16 0904  ALBUMIN 2.6* 2.5* 2.7* 2.8*   No results for input(s): LIPASE, AMYLASE in the last 168 hours. No results for input(s): AMMONIA in the last 168 hours. Coagulation Profile: No results for  input(s): INR, PROTIME in the last 168 hours. Cardiac Enzymes:  Recent Labs Lab 05/17/16 0208 05/17/16 0711  TROPONINI 0.04* 0.04*   BNP (last 3 results) No results for input(s): PROBNP in the last 8760 hours. HbA1C: No results for input(s): HGBA1C in the last 72 hours. CBG:  Recent Labs Lab 05/19/16 1329 05/19/16 1626 05/19/16 2205 05/20/16 0737 05/20/16 1245  GLUCAP 160* 239* 186* 146* 212*   Lipid Profile: No results for input(s): CHOL, HDL, LDLCALC, TRIG, CHOLHDL, LDLDIRECT in the last 72 hours. Thyroid Function Tests: No results for input(s): TSH, T4TOTAL, FREET4, T3FREE, THYROIDAB in the last 72 hours. Anemia Panel: No results for input(s): VITAMINB12, FOLATE, FERRITIN, TIBC, IRON, RETICCTPCT in the last 72 hours. Urine analysis:    Component Value Date/Time   COLORURINE YELLOW 05/18/2016 2110   APPEARANCEUR HAZY (A) 05/18/2016 2110   LABSPEC 1.018 05/18/2016 2110   PHURINE 7.0 05/18/2016 2110   GLUCOSEU >=500 (A) 05/18/2016 2110   HGBUR NEGATIVE 05/18/2016 2110   BILIRUBINUR NEGATIVE 05/18/2016 2110   BILIRUBINUR neg 05/26/2014 1956   KETONESUR NEGATIVE 05/18/2016 2110   PROTEINUR >=300 (A) 05/18/2016 2110   UROBILINOGEN 0.2 09/30/2014 1730   NITRITE NEGATIVE 05/18/2016 2110   LEUKOCYTESUR NEGATIVE 05/18/2016 2110   Sepsis Labs: @LABRCNTIP (procalcitonin:4,lacticidven:4) ) Recent Results (from the past 240 hour(s))  Urine culture     Status: None   Collection Time: 05/16/16  7:44 PM  Result Value Ref Range Status   Specimen Description URINE, RANDOM  Final   Special Requests NONE  Final   Culture NO GROWTH  Final   Report Status 05/18/2016 FINAL  Final  Culture, blood (x 2)     Status: None   Collection Time: 05/16/16  8:00 PM  Result Value Ref Range Status   Specimen Description BLOOD RIGHT ANTECUBITAL  Final  Special Requests   Final    BOTTLES DRAWN AEROBIC AND ANAEROBIC Blood Culture adequate volume   Culture NO GROWTH 5 DAYS  Final   Report  Status 05/21/2016 FINAL  Final  Culture, blood (x 2)     Status: None   Collection Time: 05/16/16  8:32 PM  Result Value Ref Range Status   Specimen Description BLOOD RIGHT HAND  Final   Special Requests   Final    BOTTLES DRAWN AEROBIC AND ANAEROBIC Blood Culture adequate volume   Culture NO GROWTH 5 DAYS  Final   Report Status 05/21/2016 FINAL  Final  MRSA PCR Screening     Status: None   Collection Time: 05/17/16  2:08 AM  Result Value Ref Range Status   MRSA by PCR NEGATIVE NEGATIVE Final    Comment:        The GeneXpert MRSA Assay (FDA approved for NASAL specimens only), is one component of a comprehensive MRSA colonization surveillance program. It is not intended to diagnose MRSA infection nor to guide or monitor treatment for MRSA infections.   Surgical pcr screen     Status: None   Collection Time: 05/19/16  6:39 AM  Result Value Ref Range Status   MRSA, PCR NEGATIVE NEGATIVE Final   Staphylococcus aureus NEGATIVE NEGATIVE Final    Comment:        The Xpert SA Assay (FDA approved for NASAL specimens in patients over 18 years of age), is one component of a comprehensive surveillance program.  Test performance has been validated by [facility] for patients greater than or equal to 75 year old. It is not intended to diagnose infection nor to guide or monitor treatment.      Radiological Exams on Admission: Dg Chest 2 View  Result Date: 05/23/2016 CLINICAL DATA:  Left-sided chest pain with shortness of breath EXAM: CHEST  2 VIEW COMPARISON:  05/16/2016 FINDINGS: Post sternotomy changes. Low lung volumes. No focal consolidation or effusion. Stable cardiomegaly with mild central congestion. No pneumothorax. IMPRESSION: 1. Cardiomegaly with mild central congestion 2. Low lung volume.  No edema or infiltrate. Electronically Signed   By: Jasmine Pang M.D.   On: 05/23/2016 15:57    EKG: Independently reviewed. Sinus rhythm, T-wave inversion in leads I and aVL appear  unchanged from prior.   Assessment/Plan  1. Chest pain, CAD  - Pt presents with 3 days of chest pain, becoming more intense and more constant  - He has hx of CABG in Sept '17 and had not experienced any angina since that time  - He was treated with ASA 324 and NTG x2 with EMS and reports no appreciable improvement in sxs  - Initial workup in reassuring with EKG apparently unchanged and troponin 0.01  - Cardiology is consulting and much appreciated; will follow-up on recommendations - Plan to monitor on telemetry for ischemic changes, obtain serial troponin measurements, repeat EKG prn, continue ASA 81, Plavix, Lipitor, and Lopressor   2. ESRD - Pt recently converted from PD to HD with improvement in chronic dyspnea and edema  - He completed HD on day of admission, remains above dry weight, but with no indication for emergent HD  - Nephrology is consulting and much appreciated, planning for repeat HD on 05/24/16   - SLIV, renal diet, continue binders, calcitriol    3. Insulin-dependent DM  - A1c was 12.8% in May 2018  - Managed at home with Lantus 40 units BID and Humalog 15-30 units TID - Check  CBG with meals and qHS - Continue Lantus 40 units BID with Novolog 10 units with meals, and a moderate-intensity SSI   4. Hypertension  - BP slightly elevated on admission, with hypervolemia likely contributing  - Continue Lopressor   5. Chronic pain - Pt has chronic MSK pain  - Continue home-regimen of Neurontin, oral Dilaudid prn severe pain   6. Anemia  - Hgb is stable on admission at 8.8  - Likely secondary to CKD; no evidence for bleeding  - IV iron with HD, colony stimulator per nephro    DVT prophylaxis: sq heparin Code Status: Full  Family Communication: Discussed with patient Disposition Plan: Observe in SDU Consults called: Nephrology, cardiology Admission status: Observation    Briscoe Deutscher, MD [facility] Pager [phone]  If 7PM-7AM, please contact  night-coverage [email or web address] Password [facility]  05/23/2016, 8:09 PM

## 2016-05-23 NOTE — ED Notes (Signed)
Attempted report x 2 

## 2016-05-24 ENCOUNTER — Telehealth: Payer: Self-pay | Admitting: Cardiology

## 2016-05-24 DIAGNOSIS — J45909 Unspecified asthma, uncomplicated: Secondary | ICD-10-CM | POA: Diagnosis not present

## 2016-05-24 DIAGNOSIS — Z951 Presence of aortocoronary bypass graft: Secondary | ICD-10-CM | POA: Diagnosis not present

## 2016-05-24 DIAGNOSIS — N186 End stage renal disease: Secondary | ICD-10-CM | POA: Diagnosis not present

## 2016-05-24 DIAGNOSIS — E039 Hypothyroidism, unspecified: Secondary | ICD-10-CM | POA: Diagnosis not present

## 2016-05-24 DIAGNOSIS — E1122 Type 2 diabetes mellitus with diabetic chronic kidney disease: Secondary | ICD-10-CM | POA: Diagnosis not present

## 2016-05-24 DIAGNOSIS — I25810 Atherosclerosis of coronary artery bypass graft(s) without angina pectoris: Secondary | ICD-10-CM | POA: Diagnosis not present

## 2016-05-24 DIAGNOSIS — Z794 Long term (current) use of insulin: Secondary | ICD-10-CM | POA: Diagnosis not present

## 2016-05-24 DIAGNOSIS — I10 Essential (primary) hypertension: Secondary | ICD-10-CM | POA: Diagnosis not present

## 2016-05-24 DIAGNOSIS — R072 Precordial pain: Secondary | ICD-10-CM | POA: Diagnosis not present

## 2016-05-24 DIAGNOSIS — R0781 Pleurodynia: Secondary | ICD-10-CM

## 2016-05-24 DIAGNOSIS — I120 Hypertensive chronic kidney disease with stage 5 chronic kidney disease or end stage renal disease: Secondary | ICD-10-CM | POA: Diagnosis not present

## 2016-05-24 DIAGNOSIS — R0602 Shortness of breath: Secondary | ICD-10-CM | POA: Diagnosis not present

## 2016-05-24 DIAGNOSIS — D631 Anemia in chronic kidney disease: Secondary | ICD-10-CM | POA: Diagnosis not present

## 2016-05-24 DIAGNOSIS — I208 Other forms of angina pectoris: Secondary | ICD-10-CM | POA: Diagnosis not present

## 2016-05-24 DIAGNOSIS — Z992 Dependence on renal dialysis: Secondary | ICD-10-CM | POA: Diagnosis not present

## 2016-05-24 DIAGNOSIS — N2581 Secondary hyperparathyroidism of renal origin: Secondary | ICD-10-CM | POA: Diagnosis not present

## 2016-05-24 LAB — TROPONIN I

## 2016-05-24 LAB — RENAL FUNCTION PANEL
Albumin: 3 g/dL — ABNORMAL LOW (ref 3.5–5.0)
Anion gap: 14 (ref 5–15)
BUN: 50 mg/dL — AB (ref 6–20)
CHLORIDE: 100 mmol/L — AB (ref 101–111)
CO2: 25 mmol/L (ref 22–32)
CREATININE: 9.02 mg/dL — AB (ref 0.61–1.24)
Calcium: 7.8 mg/dL — ABNORMAL LOW (ref 8.9–10.3)
GFR calc non Af Amer: 6 mL/min — ABNORMAL LOW (ref >60)
GFR, EST AFRICAN AMERICAN: 7 mL/min — AB (ref >60)
Glucose, Bld: 237 mg/dL — ABNORMAL HIGH (ref 65–99)
Phosphorus: 9.6 mg/dL — ABNORMAL HIGH (ref 2.5–4.6)
Potassium: 4.8 mmol/L (ref 3.5–5.1)
Sodium: 139 mmol/L (ref 135–145)

## 2016-05-24 LAB — CBC
HCT: 28.6 % — ABNORMAL LOW (ref 39.0–52.0)
HCT: 29 % — ABNORMAL LOW (ref 39.0–52.0)
Hemoglobin: 8.9 g/dL — ABNORMAL LOW (ref 13.0–17.0)
Hemoglobin: 9.2 g/dL — ABNORMAL LOW (ref 13.0–17.0)
MCH: 27.3 pg (ref 26.0–34.0)
MCH: 27.5 pg (ref 26.0–34.0)
MCHC: 31.1 g/dL (ref 30.0–36.0)
MCHC: 31.7 g/dL (ref 30.0–36.0)
MCV: 87.7 fL (ref 78.0–100.0)
MCV: 86.6 fL (ref 78.0–100.0)
PLATELETS: 331 10*3/uL (ref 150–400)
Platelets: 322 10*3/uL (ref 150–400)
RBC: 3.26 MIL/uL — ABNORMAL LOW (ref 4.22–5.81)
RBC: 3.35 MIL/uL — ABNORMAL LOW (ref 4.22–5.81)
RDW: 15.9 % — AB (ref 11.5–15.5)
RDW: 15.9 % — AB (ref 11.5–15.5)
WBC: 6.9 10*3/uL (ref 4.0–10.5)
WBC: 7.7 10*3/uL (ref 4.0–10.5)

## 2016-05-24 LAB — BASIC METABOLIC PANEL
Anion gap: 15 (ref 5–15)
BUN: 50 mg/dL — AB (ref 6–20)
CO2: 26 mmol/L (ref 22–32)
CREATININE: 8.97 mg/dL — AB (ref 0.61–1.24)
Calcium: 7.9 mg/dL — ABNORMAL LOW (ref 8.9–10.3)
Chloride: 98 mmol/L — ABNORMAL LOW (ref 101–111)
GFR calc Af Amer: 7 mL/min — ABNORMAL LOW (ref >60)
GFR, EST NON AFRICAN AMERICAN: 6 mL/min — AB (ref >60)
GLUCOSE: 158 mg/dL — AB (ref 65–99)
Potassium: 4.8 mmol/L (ref 3.5–5.1)
Sodium: 139 mmol/L (ref 135–145)

## 2016-05-24 LAB — GLUCOSE, CAPILLARY
GLUCOSE-CAPILLARY: 246 mg/dL — AB (ref 65–99)
Glucose-Capillary: 169 mg/dL — ABNORMAL HIGH (ref 65–99)
Glucose-Capillary: 166 mg/dL — ABNORMAL HIGH (ref 65–99)
Glucose-Capillary: 174 mg/dL — ABNORMAL HIGH (ref 65–99)

## 2016-05-24 MED ORDER — LIVING WELL WITH DIABETES BOOK
Freq: Once | Status: AC
  Administered 2016-05-24: 1
  Filled 2016-05-24: qty 1

## 2016-05-24 NOTE — Progress Notes (Addendum)
[facility] Rounding Note  Subjective/Interval History:  Still with sternal CP with rotation of thorax/deep breathing Not really SOB but "full" Had only 1 hour 45 min HD yesterday outpt (taken of HD 2/2 chest pain and sent to ED)  Objective Vital signs in last 24 hours: Vitals:   05/23/16 2034 05/23/16 2300 05/24/16 0500 05/24/16 0804  BP: (!) 167/95 134/90 (!) 142/100 (!) 158/95  Pulse: 93 87 94 84  Resp: 15 18  18   Temp: 97.8 F (36.6 C)  97.7 F (36.5 C) 98.3 F (36.8 C)  TempSrc: Oral  Oral Oral  SpO2: 100% 94% 99% 99%  Weight:   120.3 kg (265 lb 3.2 oz)   Height:       Weight change:   Intake/Output Summary (Last 24 hours) at 05/24/16 1101 Last data filed at 05/24/16 0900  Gross per 24 hour  Intake                0 ml  Output              350 ml  Net             -350 ml   Physical Exam:  Blood pressure (!) 158/95, pulse 84, temperature 98.3 F (36.8 C), temperature source Oral, resp. rate 18, height 6\' 2"  (1.88 m), weight 120.3 kg (265 lb 3.2 oz), SpO2 99 %.  Very nice Caucasian male, NAD Sitting on edge of bed NAD Lungs clear S1S2 No S3 Healed sternotomy scar No crepitus of sternum Abd soft.Site of recent PD cath removal OK. 1+ edema both LE's    Recent Labs Lab 05/17/16 1141 05/18/16 0357 05/19/16 0558 05/20/16 0904 05/23/16 1558 05/24/16 0815  NA 137 136 136 132* 136 139  K 4.7 4.3 4.2 4.1 4.3 4.8  CL 99* 95* 97* 94* 97* 98*  CO2 23 30 28 24 29 26   GLUCOSE 278* 256* 232* 157* 259* 158*  BUN 59* 31* 23* 40* 38* 50*  CREATININE 8.64* 5.81* 5.34* 8.17* 7.17* 8.97*  CALCIUM 6.9* 7.4* 7.8* 7.9* 7.8* 7.9*  PHOS 8.7* 6.0* 5.6* 7.9*  --   --      Recent Labs Lab 05/18/16 0357 05/19/16 0558 05/20/16 0904  ALBUMIN 2.5* 2.7* 2.8*    Recent Labs Lab 05/19/16 1544 05/20/16 0903 05/23/16 1558 05/24/16 0815  WBC 7.8 7.6 6.2 6.9  HGB 9.5* 9.2* 8.8* 8.9*  HCT 30.4* 29.8* 27.3* 28.6*  MCV 88.1 87.9 86.9 87.7  PLT 324 314 321 322     Recent  Labs Lab 05/23/16 2028 05/24/16 0202 05/24/16 0815  TROPONINI <0.03 <0.03 <0.03     Recent Labs Lab 05/19/16 2205 05/20/16 0737 05/20/16 1245 05/23/16 2033 05/24/16 0729  GLUCAP 186* 146* 212* 128* 169*    Studies/Results: Dg Chest 2 View  Result Date: 05/23/2016 CLINICAL DATA:  Left-sided chest pain with shortness of breath EXAM: CHEST  2 VIEW COMPARISON:  05/16/2016 FINDINGS: Post sternotomy changes. Low lung volumes. No focal consolidation or effusion. Stable cardiomegaly with mild central congestion. No pneumothorax. IMPRESSION: 1. Cardiomegaly with mild central congestion 2. Low lung volume.  No edema or infiltrate. Electronically Signed   By: Jasmine PangKim  Fujinaga M.D.   On: 05/23/2016 15:57   Medications: . sodium chloride    . sodium chloride     . amLODipine  10 mg Oral Daily  . aspirin EC  81 mg Oral Daily  . atorvastatin  80 mg Oral q1800  . [START ON 05/25/2016] calcitRIOL  0.25 mcg Oral Q T,Th,Sa-HD  . calcium acetate  1,334 mg Oral TID WC  . clopidogrel  75 mg Oral Daily  . cyclobenzaprine  10 mg Oral QHS  . gabapentin  800 mg Oral BID  . heparin  5,000 Units Subcutaneous Q8H  . hydrOXYzine  25 mg Oral QHS  . insulin aspart  0-15 Units Subcutaneous TID WC  . insulin aspart  0-5 Units Subcutaneous QHS  . insulin aspart  10 Units Subcutaneous TID WC  . insulin aspart  7 Units Subcutaneous Once  . insulin glargine  40 Units Subcutaneous BID  . levothyroxine  50 mcg Oral QAC breakfast  . metoprolol tartrate  50 mg Oral BID   Hemodialysis prescription: [facility] Tuesday, Thursday, Saturday,  4 hours,  180 dialyzer,  BFR 400/DFR800,  left radiocephalic fistula,  EDW 115 kg, 2K/2.5 calcium,  no UF profile,  no sodium modeling. Calcitriol 0.25 g every dialysis,  Venofer 100 mg every dialysis,  Mircera 100 g every 2 weeks  Background: 46 year old Caucasian man with PMH ESRD (PD->HD last adm/PD cath out), DM2, HTN, hypothyroidism, CAD s/p CABG  09/2015, admitted with worsening substernal chest pain after dialysis (started on Sunday but he decided to ignore it for a while because he had just left the hospital on Friday of last week). Outpt HD in [place] abbreviated after 1'45" on 5/22 2/2 pain and sent to the ED.   Assessment/Plan: 1. Substernal chest pain: CABG S/P CABG 09/2015 [place] neg. Pain not really anginal. Seems very MSK to me (movement/deep breathing). Cards plans ECHO and if OK, then probably home with NSAIDS 2. CAD s/p CABG - as above.   3. ESRD - had abbreviated Rx yesterday. Vol up by exam. For additional Rx today. 5 kg over EDW. 4. Anemia - ESA as outpt 5. Secondary HPT - binders resumed ca acetate). Calcitriol w/his regular HD TMTS  Russell Bal, MD [facility] [phone] pager 05/24/2016, 11:01 AM

## 2016-05-24 NOTE — Plan of Care (Signed)
Problem: Pain Managment: Goal: General experience of comfort will improve Outcome: Progressing Pt having 9/10 chest pain/tightness at start of shift. unrelieved w/nitro. PRN pain medication administered w/ some relief. Pain now 3/10. Continue to monitor.

## 2016-05-24 NOTE — Procedures (Signed)
I have personally attended this patient's dialysis session.   5-6 liter goal 2K bath Tolerating well so far Usual TTS (Off schedule tmt today 2/2 tmt cut short yesterday)  Camille [name] Jamie Hafford, MD [facility] [phone] Pager 05/24/2016, 2:22 PM

## 2016-05-24 NOTE — Plan of Care (Signed)
Problem: Safety: Goal: Ability to remain free from injury will improve Outcome: Progressing Patient is aware and verbalized a understanding of using the call bell to call for assistance to use the restroom or to get up in the chair.

## 2016-05-24 NOTE — Progress Notes (Signed)
Progress Note  Patient Name: Russell [name] Date of Encounter: 05/24/2016  Primary Cardiologist: Dr. Jens Som  Subjective   Episode of chest pain early this morning that resolved after pain medication. No improvement with SL nitro. Movement makes his pain worse. Recent low grade fever and chills.   Inpatient Medications    Scheduled Meds: . amLODipine  10 mg Oral Daily  . aspirin EC  81 mg Oral Daily  . atorvastatin  80 mg Oral q1800  . [START ON 05/25/2016] calcitRIOL  0.25 mcg Oral Q T,Th,Sa-HD  . calcium acetate  1,334 mg Oral TID WC  . clopidogrel  75 mg Oral Daily  . cyclobenzaprine  10 mg Oral QHS  . gabapentin  800 mg Oral BID  . heparin  5,000 Units Subcutaneous Q8H  . hydrOXYzine  25 mg Oral QHS  . insulin aspart  0-15 Units Subcutaneous TID WC  . insulin aspart  0-5 Units Subcutaneous QHS  . insulin aspart  10 Units Subcutaneous TID WC  . insulin aspart  7 Units Subcutaneous Once  . insulin glargine  40 Units Subcutaneous BID  . levothyroxine  50 mcg Oral QAC breakfast  . metoprolol tartrate  50 mg Oral BID   Continuous Infusions: . sodium chloride    . sodium chloride     PRN Meds: sodium chloride, sodium chloride, acetaminophen, albuterol, alteplase, heparin, heparin, HYDROcodone-acetaminophen, HYDROmorphone, lidocaine (PF), lidocaine-prilocaine, metoCLOPramide, nitroGLYCERIN, ondansetron (ZOFRAN) IV, pentafluoroprop-tetrafluoroeth   Vital Signs    Vitals:   05/23/16 2034 05/23/16 2300 05/24/16 0500 05/24/16 0804  BP: (!) 167/95 134/90 (!) 142/100 (!) 158/95  Pulse: 93 87 94 84  Resp: 15 18  18   Temp: 97.8 F (36.6 C)  97.7 F (36.5 C) 98.3 F (36.8 C)  TempSrc: Oral  Oral Oral  SpO2: 100% 94% 99% 99%  Weight:   265 lb 3.2 oz (120.3 kg)   Height:        Intake/Output Summary (Last 24 hours) at 05/24/16 1610 Last data filed at 05/24/16 0900  Gross per 24 hour  Intake                0 ml  Output              350 ml  Net             -350 ml    Filed Weights   05/23/16 1601 05/24/16 0500  Weight: 267 lb (121.1 kg) 265 lb 3.2 oz (120.3 kg)    Telemetry    NSR at 70s- Personally Reviewed  ECG    NSR at rate of 86 bpm - Personally Reviewed  Physical Exam   GEN: obese male in no acute distress.   Neck: No JVD Cardiac: RRR, no murmurs, rubs, or gallops.  Respiratory: Clear to auscultation bilaterally. GI: Soft, nontender, non-distended  MS: No edema; No deformity. Neuro:  Nonfocal  Psych: Normal affect   Labs    Chemistry Recent Labs Lab 05/18/16 0357 05/19/16 0558 05/20/16 0904 05/23/16 1558  NA 136 136 132* 136  K 4.3 4.2 4.1 4.3  CL 95* 97* 94* 97*  CO2 30 28 24 29   GLUCOSE 256* 232* 157* 259*  BUN 31* 23* 40* 38*  CREATININE 5.81* 5.34* 8.17* 7.17*  CALCIUM 7.4* 7.8* 7.9* 7.8*  ALBUMIN 2.5* 2.7* 2.8*  --   GFRNONAA 11* 12* 7* 8*  GFRAA 12* 14* 8* 10*  ANIONGAP 11 11 14 10      Hematology Recent  Labs Lab 05/19/16 1544 05/20/16 0903 05/23/16 1558  WBC 7.8 7.6 6.2  RBC 3.45* 3.39* 3.14*  HGB 9.5* 9.2* 8.8*  HCT 30.4* 29.8* 27.3*  MCV 88.1 87.9 86.9  MCH 27.5 27.1 28.0  MCHC 31.3 30.9 32.2  RDW 16.0* 16.2* 16.1*  PLT 324 314 321    Cardiac Enzymes Recent Labs Lab 05/23/16 2028 05/24/16 0202  TROPONINI <0.03 <0.03    Recent Labs Lab 05/23/16 1607  TROPIPOC 0.01     BNP Recent Labs Lab 05/23/16 1558  BNP 299.0*     DDimer No results for input(s): DDIMER in the last 168 hours.   Radiology    Dg Chest 2 View  Result Date: 05/23/2016 CLINICAL DATA:  Left-sided chest pain with shortness of breath EXAM: CHEST  2 VIEW COMPARISON:  05/16/2016 FINDINGS: Post sternotomy changes. Low lung volumes. No focal consolidation or effusion. Stable cardiomegaly with mild central congestion. No pneumothorax. IMPRESSION: 1. Cardiomegaly with mild central congestion 2. Low lung volume.  No edema or infiltrate. Electronically Signed   By: Jasmine PangKim  Fujinaga M.D.   On: 05/23/2016 15:57    Cardiac  Studies   Pending echo  Patient Profile     46 y.o. male with a history of nephrotic syndrome, HTN, ESRD on peritoneal HD, CAD s/p CABGx3V (09/2015), morbid obesity, DMT2, and DVT who [facility] with chest pain.   Assessment & Plan    1. Atypical pleuritic chest pain - Worse with palpitation and movement. CT angio last week was negative for PE. No acute ischemic changes. Troponin negative x 2. Symptoms improves with pain medication. Pending echo to r/o pericardiac effusion. Consider schedule NSAIDS.   2. CAD s/p CABG x3V - chest pain not like typical angina.  Continue ASA/plavix, statin, and BB  3. HTN:  - Elevated at times. Consider up titration of meds as needed.   4. Chronic diastolic CHF: - BNP is mildly elevated. Volume managed by HD.   5. ESRD on PD (TThSa):  - Per primary team. He is unable to complete dialysis yesterday.   Signed, Manson PasseyBhagat,Shemekia Patane, PA  05/24/2016, 9:09 AM

## 2016-05-24 NOTE — Progress Notes (Signed)
Inpatient Diabetes Program Recommendations  AACE/ADA: New Consensus Statement on Inpatient Glycemic Control (2015)  Target Ranges:  Prepandial:   less than 140 mg/dL      Peak postprandial:   less than 180 mg/dL (1-2 hours)      Critically ill patients:  140 - 180 mg/dL    Spoke with patient about diabetes and home regimen for diabetes control. Patient reports that he is followed by Russell [name], Endocrinologist outpatient for diabetes control. Patient reports that he is taking insulin as prescribed and that they last saw Dr. Elvera [name] 1 year ago but has had several medical problems arise to where he did not have a chance to follow up. A1c 1 year ago was aroun 7% but since had been over 15% 2.5 months ago and now has trended downward to 12.8% this admission. He checks his glucose 3-4 x a day and tells me his glucose range is between 150-200's at home. Patient was recently switched from peritoneal dialysis (dextrose in fluid) to HD dialysis. Which could explain his recent improvement in A1c level. Patient could also not have been absorbing his insulin due to the amount of fluid on board as well. Patient to monitor glucose levels and make an appointment with Dr. Elvera [name] next week for follow up.  Patient verbalized understanding of information discussed and has no further questions at this time related to diabetes.   Thanks,  Russell DeemShannon Veronique Warga RN, MSN, [facility] Inpatient Diabetes Coordinator Team Pager [phone] (8a-5p)

## 2016-05-24 NOTE — Progress Notes (Signed)
[facility] Progress Note  Patient: Russell BirkenheadSteven B Pohlman WUJ:[id number]   PCP: Shade FloodGreene, Jeffrey R, MD DOB: [birth year]   DOA: 05/23/2016   DOS: 05/24/2016   Date of Service: the patient was seen and examined on 05/24/2016  Subjective: Continues to have this chest pain. Present since last 3 days. Worsening with sitting up as well as feels pleuritic and stabbing. No nausea no vomiting. No fever no chills.  Brief hospital course: Pt. with PMH of ESRD on TTS HD, CAD, hypothyroidism, type II DM, HTN; admitted on 05/23/2016, presented with complaint of chest pain, was found to have pleuritic chest pain likely from pleurisy versus pericarditis. Currently further plan is continue supportive management.  Assessment and Plan: 1. Chest pain, pleuritic in nature. EKG unremarkable, serial troponins negative. Cardiology was consulted. No further workup recommended other than echocardiogram at present. Cardiogenic recommended of the echo is unremarkable, patient admitted discharged home on oral NSAIDs. This very likely represent mild pericarditis/pleuritis given patient's recent transition from PD to HD during which she may have received less so than usual ultra filtrate due to being new on HD. Continue monitoring.  2. ESRD. K acute on chronic diastolic dysfunction. Patient was recently converted from PD to HD. Nexium although his shortness of breath as well as swelling has improved, he is above his dry weight and nephrology is recommending to continue hemodialysis for another session today and then regular session after that. Continue daily ins and outs and renal diet. Appreciate nephrology input.  3. Type 2 insulin-dependent diabetes mellitus. A1c was significantly elevated 12.8 in May 2018. Continuing home regimen at present. Continue sliding scale as well.  4. hypertension. Neck some blood pressures are stable, continue Lopressor.  5. Anemia of chronic kidney disease. Continue close monitoring at  present.  Diet: Renal diet DVT Prophylaxis: subcutaneous Heparin  Advance goals of care discussion: full code  Family Communication: no family was present at bedside, at the time of interview.  Disposition:  Discharge to home tomorrow.  Consultants: cardiology, nephrology Procedures: HD  Antibiotics: Anti-infectives    None       Objective: Physical Exam: Vitals:   05/24/16 1525 05/24/16 1555 05/24/16 1625 05/24/16 1650  BP: 116/61 120/61 118/65 (!) 124/48  Pulse: 92 96 89 88  Resp:    15  Temp:    97.5 F (36.4 C)  TempSrc:    Oral  SpO2:    99%  Weight:    121.6 kg (268 lb 1.3 oz)  Height:        Intake/Output Summary (Last 24 hours) at 05/24/16 1804 Last data filed at 05/24/16 1650  Gross per 24 hour  Intake                0 ml  Output             3350 ml  Net            -3350 ml   Filed Weights   05/24/16 0500 05/24/16 1245 05/24/16 1650  Weight: 120.3 kg (265 lb 3.2 oz) 126.5 kg (278 lb 14.1 oz) 121.6 kg (268 lb 1.3 oz)   General: Alert, Awake and Oriented to Time, Place and Person. Appear in moderate distress, affect appropriate Eyes: PERRL, Conjunctiva normal ENT: Oral Mucosa clear moist. Neck: difficult to assess JVD, no Abnormal Mass Or lumps Cardiovascular: S1 and S2 Present, no Murmur, Respiratory: Bilateral Air entry equal and Decreased, no use of accessory muscle, Clear to Auscultation, no Crackles, no wheezes Abdomen:  Bowel Sound present, Soft and no tenderness Skin: no redness, no Rash, no induration Extremities: no Pedal edema, no calf tenderness Neurologic: Grossly no focal neuro deficit. Bilaterally Equal motor strength  Data Reviewed: CBC:  Recent Labs Lab 05/19/16 1544 05/20/16 0903 05/23/16 1558 05/24/16 0815 05/24/16 1312  WBC 7.8 7.6 6.2 6.9 7.7  HGB 9.5* 9.2* 8.8* 8.9* 9.2*  HCT 30.4* 29.8* 27.3* 28.6* 29.0*  MCV 88.1 87.9 86.9 87.7 86.6  PLT 324 314 321 322 331   Basic Metabolic Panel:  Recent Labs Lab 05/18/16 0357  05/19/16 0558 05/20/16 0904 05/23/16 1558 05/24/16 0815 05/24/16 1312  NA 136 136 132* 136 139 139  K 4.3 4.2 4.1 4.3 4.8 4.8  CL 95* 97* 94* 97* 98* 100*  CO2 30 28 24 29 26 25   GLUCOSE 256* 232* 157* 259* 158* 237*  BUN 31* 23* 40* 38* 50* 50*  CREATININE 5.81* 5.34* 8.17* 7.17* 8.97* 9.02*  CALCIUM 7.4* 7.8* 7.9* 7.8* 7.9* 7.8*  PHOS 6.0* 5.6* 7.9*  --   --  9.6*    Liver Function Tests:  Recent Labs Lab 05/18/16 0357 05/19/16 0558 05/20/16 0904 05/24/16 1312  ALBUMIN 2.5* 2.7* 2.8* 3.0*   No results for input(s): LIPASE, AMYLASE in the last 168 hours. No results for input(s): AMMONIA in the last 168 hours. Coagulation Profile: No results for input(s): INR, PROTIME in the last 168 hours. Cardiac Enzymes:  Recent Labs Lab 05/23/16 2028 05/24/16 0202 05/24/16 0815  TROPONINI <0.03 <0.03 <0.03   BNP (last 3 results) No results for input(s): PROBNP in the last 8760 hours. CBG:  Recent Labs Lab 05/20/16 1245 05/23/16 2033 05/24/16 0729 05/24/16 1137 05/24/16 1740  GLUCAP 212* 128* 169* 166* 174*   Studies: No results found.  Scheduled Meds: . amLODipine  10 mg Oral Daily  . aspirin EC  81 mg Oral Daily  . atorvastatin  80 mg Oral q1800  . [START ON 05/25/2016] calcitRIOL  0.25 mcg Oral Q T,Th,Sa-HD  . calcium acetate  1,334 mg Oral TID WC  . clopidogrel  75 mg Oral Daily  . cyclobenzaprine  10 mg Oral QHS  . gabapentin  800 mg Oral BID  . heparin  5,000 Units Subcutaneous Q8H  . hydrOXYzine  25 mg Oral QHS  . insulin aspart  0-15 Units Subcutaneous TID WC  . insulin aspart  0-5 Units Subcutaneous QHS  . insulin aspart  10 Units Subcutaneous TID WC  . insulin glargine  40 Units Subcutaneous BID  . levothyroxine  50 mcg Oral QAC breakfast  . metoprolol tartrate  50 mg Oral BID   Continuous Infusions: PRN Meds: acetaminophen, albuterol, HYDROcodone-acetaminophen, HYDROmorphone, metoCLOPramide, nitroGLYCERIN, ondansetron (ZOFRAN) IV  Time spent: 30  minutes  Author: Lynden Oxford, MD [facility] Pager: [phone] 05/24/2016 6:04 PM  If 7PM-7AM, please contact night-coverage at [email or web address], password [facility]

## 2016-05-24 NOTE — Telephone Encounter (Signed)
[facility] Dr Jens Somrenshaw.  Records put with Dr Ludwig Clarksrenshaw's schedule for  08/03/16. lp

## 2016-05-25 ENCOUNTER — Other Ambulatory Visit (HOSPITAL_COMMUNITY): Payer: Medicare Other

## 2016-05-25 DIAGNOSIS — J45909 Unspecified asthma, uncomplicated: Secondary | ICD-10-CM | POA: Diagnosis not present

## 2016-05-25 DIAGNOSIS — R0789 Other chest pain: Secondary | ICD-10-CM

## 2016-05-25 DIAGNOSIS — I120 Hypertensive chronic kidney disease with stage 5 chronic kidney disease or end stage renal disease: Secondary | ICD-10-CM | POA: Diagnosis not present

## 2016-05-25 DIAGNOSIS — Z8719 Personal history of other diseases of the digestive system: Secondary | ICD-10-CM | POA: Diagnosis not present

## 2016-05-25 DIAGNOSIS — E039 Hypothyroidism, unspecified: Secondary | ICD-10-CM | POA: Diagnosis not present

## 2016-05-25 DIAGNOSIS — Z951 Presence of aortocoronary bypass graft: Secondary | ICD-10-CM | POA: Diagnosis not present

## 2016-05-25 DIAGNOSIS — Z794 Long term (current) use of insulin: Secondary | ICD-10-CM | POA: Diagnosis not present

## 2016-05-25 DIAGNOSIS — E1141 Type 2 diabetes mellitus with diabetic mononeuropathy: Secondary | ICD-10-CM | POA: Diagnosis not present

## 2016-05-25 DIAGNOSIS — R0602 Shortness of breath: Secondary | ICD-10-CM | POA: Diagnosis not present

## 2016-05-25 DIAGNOSIS — Z992 Dependence on renal dialysis: Secondary | ICD-10-CM | POA: Diagnosis not present

## 2016-05-25 DIAGNOSIS — D631 Anemia in chronic kidney disease: Secondary | ICD-10-CM | POA: Diagnosis not present

## 2016-05-25 DIAGNOSIS — I25810 Atherosclerosis of coronary artery bypass graft(s) without angina pectoris: Secondary | ICD-10-CM | POA: Diagnosis not present

## 2016-05-25 DIAGNOSIS — R0781 Pleurodynia: Secondary | ICD-10-CM | POA: Diagnosis not present

## 2016-05-25 DIAGNOSIS — N2581 Secondary hyperparathyroidism of renal origin: Secondary | ICD-10-CM | POA: Diagnosis not present

## 2016-05-25 DIAGNOSIS — E1122 Type 2 diabetes mellitus with diabetic chronic kidney disease: Secondary | ICD-10-CM | POA: Diagnosis not present

## 2016-05-25 DIAGNOSIS — R072 Precordial pain: Secondary | ICD-10-CM | POA: Diagnosis not present

## 2016-05-25 DIAGNOSIS — N186 End stage renal disease: Secondary | ICD-10-CM | POA: Diagnosis not present

## 2016-05-25 LAB — GLUCOSE, CAPILLARY
Glucose-Capillary: 184 mg/dL — ABNORMAL HIGH (ref 65–99)
Glucose-Capillary: 176 mg/dL — ABNORMAL HIGH (ref 65–99)

## 2016-05-25 LAB — RENAL FUNCTION PANEL
ANION GAP: 10 (ref 5–15)
Albumin: 3 g/dL — ABNORMAL LOW (ref 3.5–5.0)
BUN: 28 mg/dL — ABNORMAL HIGH (ref 6–20)
CO2: 29 mmol/L (ref 22–32)
Calcium: 8.2 mg/dL — ABNORMAL LOW (ref 8.9–10.3)
Chloride: 98 mmol/L — ABNORMAL LOW (ref 101–111)
Creatinine, Ser: 5.79 mg/dL — ABNORMAL HIGH (ref 0.61–1.24)
GFR calc Af Amer: 12 mL/min — ABNORMAL LOW (ref >60)
GFR calc non Af Amer: 11 mL/min — ABNORMAL LOW (ref >60)
GLUCOSE: 267 mg/dL — AB (ref 65–99)
POTASSIUM: 4.5 mmol/L (ref 3.5–5.1)
Phosphorus: 6.7 mg/dL — ABNORMAL HIGH (ref 2.5–4.6)
Sodium: 137 mmol/L (ref 135–145)

## 2016-05-25 MED ORDER — PANTOPRAZOLE SODIUM 40 MG PO TBEC: 40.0000 mg | DELAYED_RELEASE_TABLET | Freq: Every day | ORAL | 0 refills | Status: DC

## 2016-05-25 MED ORDER — IBUPROFEN 800 MG PO TABS: 800.0000 mg | ORAL_TABLET | Freq: Two times a day (BID) | ORAL | 0 refills | Status: AC

## 2016-05-25 MED ORDER — PANTOPRAZOLE SODIUM 40 MG PO TBEC: 40.0000 mg | DELAYED_RELEASE_TABLET | Freq: Every day | ORAL | Status: DC

## 2016-05-25 MED ORDER — KETOROLAC TROMETHAMINE 30 MG/ML IJ SOLN
30.0000 mg | Freq: Once | INTRAMUSCULAR | Status: AC
  Administered 2016-05-25: 30 mg via INTRAVENOUS
  Filled 2016-05-25: qty 1

## 2016-05-25 MED ORDER — PREDNISONE 20 MG PO TABS
50.0000 mg | ORAL_TABLET | Freq: Every day | ORAL | Status: DC
  Administered 2016-05-25: 50 mg via ORAL
  Filled 2016-05-25: qty 2

## 2016-05-25 MED ORDER — IBUPROFEN 800 MG PO TABS: 800.0000 mg | ORAL_TABLET | Freq: Two times a day (BID) | ORAL | Status: DC

## 2016-05-25 NOTE — Progress Notes (Addendum)
Progress Note  Patient Name: Russell [name] Date of Encounter: 05/25/2016  Primary Cardiologist: Dr. Jens Som  Subjective   Intermittent chest pain. He thinks might have pulled muscle.   Inpatient Medications    Scheduled Meds: . amLODipine  10 mg Oral Daily  . aspirin EC  81 mg Oral Daily  . atorvastatin  80 mg Oral q1800  . calcitRIOL  0.25 mcg Oral Q T,Th,Sa-HD  . calcium acetate  1,334 mg Oral TID WC  . clopidogrel  75 mg Oral Daily  . cyclobenzaprine  10 mg Oral QHS  . gabapentin  800 mg Oral BID  . heparin  5,000 Units Subcutaneous Q8H  . hydrOXYzine  25 mg Oral QHS  . insulin aspart  0-15 Units Subcutaneous TID WC  . insulin aspart  0-5 Units Subcutaneous QHS  . insulin aspart  10 Units Subcutaneous TID WC  . insulin glargine  40 Units Subcutaneous BID  . levothyroxine  50 mcg Oral QAC breakfast  . metoprolol tartrate  50 mg Oral BID   Continuous Infusions:  PRN Meds: acetaminophen, albuterol, HYDROcodone-acetaminophen, HYDROmorphone, metoCLOPramide, nitroGLYCERIN, ondansetron (ZOFRAN) IV   Vital Signs    Vitals:   05/25/16 0616 05/25/16 0746 05/25/16 0817 05/25/16 0818  BP: (!) 144/94 (!) 144/86 (!) 144/86 (!) 144/86  Pulse: 88 89  95  Resp: 12 20    Temp: 98.7 F (37.1 C) 98.6 F (37 C)    TempSrc: Oral Oral    SpO2: 100% 100%    Weight: 267 lb 14.4 oz (121.5 kg)     Height:        Intake/Output Summary (Last 24 hours) at 05/25/16 0826 Last data filed at 05/24/16 2100  Gross per 24 hour  Intake              640 ml  Output             3200 ml  Net            -2560 ml   Filed Weights   05/24/16 1245 05/24/16 1650 05/25/16 0616  Weight: 278 lb 14.1 oz (126.5 kg) 268 lb 1.3 oz (121.6 kg) 267 lb 14.4 oz (121.5 kg)    Telemetry    NSR - Personally Reviewed  ECG    Sinus rhythm with TWI in I and aVL - no  Acute changes - Personally Reviewed  Physical Exam   GEN: No acute distress.   Neck: No JVD Cardiac: RRR, no murmurs, rubs, or  gallops. TTP at L mid chest Respiratory: Clear to auscultation bilaterally. GI: Soft, nontender, non-distended  MS: No edema; No deformity. Neuro:  Nonfocal  Psych: Normal affect   Labs    Chemistry Recent Labs Lab 05/20/16 0904  05/24/16 0815 05/24/16 1312 05/25/16 0354  NA 132*  < > 139 139 137  K 4.1  < > 4.8 4.8 4.5  CL 94*  < > 98* 100* 98*  CO2 24  < > 26 25 29   GLUCOSE 157*  < > 158* 237* 267*  BUN 40*  < > 50* 50* 28*  CREATININE 8.17*  < > 8.97* 9.02* 5.79*  CALCIUM 7.9*  < > 7.9* 7.8* 8.2*  ALBUMIN 2.8*  --   --  3.0* 3.0*  GFRNONAA 7*  < > 6* 6* 11*  GFRAA 8*  < > 7* 7* 12*  ANIONGAP 14  < > 15 14 10   < > = values in this interval not displayed.  Hematology Recent Labs Lab 05/23/16 1558 05/24/16 0815 05/24/16 1312  WBC 6.2 6.9 7.7  RBC 3.14* 3.26* 3.35*  HGB 8.8* 8.9* 9.2*  HCT 27.3* 28.6* 29.0*  MCV 86.9 87.7 86.6  MCH 28.0 27.3 27.5  MCHC 32.2 31.1 31.7  RDW 16.1* 15.9* 15.9*  PLT 321 322 331    Cardiac Enzymes Recent Labs Lab 05/23/16 2028 05/24/16 0202 05/24/16 0815  TROPONINI <0.03 <0.03 <0.03    Recent Labs Lab 05/23/16 1607  TROPIPOC 0.01     BNP Recent Labs Lab 05/23/16 1558  BNP 299.0*     DDimer No results for input(s): DDIMER in the last 168 hours.   Radiology    Dg Chest 2 View  Result Date: 05/23/2016 CLINICAL DATA:  Left-sided chest pain with shortness of breath EXAM: CHEST  2 VIEW COMPARISON:  05/16/2016 FINDINGS: Post sternotomy changes. Low lung volumes. No focal consolidation or effusion. Stable cardiomegaly with mild central congestion. No pneumothorax. IMPRESSION: 1. Cardiomegaly with mild central congestion 2. Low lung volume.  No edema or infiltrate. Electronically Signed   By: Jasmine PangKim  Fujinaga M.D.   On: 05/23/2016 15:57    Cardiac Studies   Pending echo  Patient Profile     46 y.o. male with a history of nephrotic syndrome, HTN, ESRD on peritoneal HD, CAD s/p CABGx3V (09/2015), morbid obesity, DMT2,  andDVT who [facility] with chest pain.   Assessment & Plan    1. Atypical somewhat pleuritic chest pain - Worse with palpitation and movement. CT angio last week was negative for PE. No acute ischemic changes. Troponin remained negative.  Pending echo. If unremarkable, discharge home with PCP follow up.     2. CAD s/p CABG x3V - Chest pain not like typical angina.  Continue ASA/plavix, statin, and BB  3. HTN:  - Elevated at times. Consider up titration of meds as needed.   4. Chronic diastolic CHF: - BNP is mildly elevated. Volume managed by HD.   5. ESRD on PD (TThSa):    Signed, Hannahgrace Lalli, PA  05/25/2016, 8:26 AM

## 2016-05-25 NOTE — Plan of Care (Signed)
Problem: Health Behavior/Discharge Planning: Goal: Ability to manage health-related needs will improve Outcome: Completed/Met Date Met: 05/25/16 Patient is independent in his care and ADL's. Ambulates to the restroom and tolerates well.

## 2016-05-25 NOTE — Progress Notes (Signed)
[facility] Rounding Note  Subjective/Interval History:  Still with sternal CP with rotation of thorax/deep breathing Now thinks he "pulled a pectoral muscle" No SOB now Had HD yesterday off schedule net UF 3 liters Weight to 121.6 post HD with EDW of 115 kg   Objective Vital signs in last 24 hours: Vitals:   05/25/16 0616 05/25/16 0746 05/25/16 0817 05/25/16 0818  BP: (!) 144/94 (!) 144/86 (!) 144/86 (!) 144/86  Pulse: 88 89  95  Resp: 12 20    Temp: 98.7 F (37.1 C) 98.6 F (37 C)    TempSrc: Oral Oral    SpO2: 100% 100%    Weight: 121.5 kg (267 lb 14.4 oz)     Height:       Weight change: 5.39 kg (11 lb 14.1 oz)  Intake/Output Summary (Last 24 hours) at 05/25/16 1004 Last data filed at 05/25/16 0900  Gross per 24 hour  Intake              880 ml  Output             3000 ml  Net            -2120 ml   Physical Exam:  Blood pressure (!) 144/86, pulse 95, temperature 98.6 F (37 C), temperature source Oral, resp. rate 20, height 6\' 2"  (1.88 m), weight 121.5 kg (267 lb 14.4 oz), SpO2 100 %.  Very nice Caucasian male, NAD Lying in bed NAD Lungs clear S1S2 No S3 Healed sternotomy scar No crepitus of sternum Pain with palpation in left pectoral  area Abd soft.Site of recent PD cath removal OK. Trace LE edema only   Recent Labs Lab 05/19/16 0558 05/20/16 0904 05/23/16 1558 05/24/16 0815 05/24/16 1312 05/25/16 0354  NA 136 132* 136 139 139 137  K 4.2 4.1 4.3 4.8 4.8 4.5  CL 97* 94* 97* 98* 100* 98*  CO2 28 24 29 26 25 29   GLUCOSE 232* 157* 259* 158* 237* 267*  BUN 23* 40* 38* 50* 50* 28*  CREATININE 5.34* 8.17* 7.17* 8.97* 9.02* 5.79*  CALCIUM 7.8* 7.9* 7.8* 7.9* 7.8* 8.2*  PHOS 5.6* 7.9*  --   --  9.6* 6.7*     Recent Labs Lab 05/20/16 0904 05/24/16 1312 05/25/16 0354  ALBUMIN 2.8* 3.0* 3.0*    Recent Labs Lab 05/20/16 0903 05/23/16 1558 05/24/16 0815 05/24/16 1312  WBC 7.6 6.2 6.9 7.7  HGB 9.2* 8.8* 8.9* 9.2*  HCT 29.8* 27.3* 28.6* 29.0*  MCV  87.9 86.9 87.7 86.6  PLT 314 321 322 331     Recent Labs Lab 05/23/16 2028 05/24/16 0202 05/24/16 0815  TROPONINI <0.03 <0.03 <0.03     Recent Labs Lab 05/24/16 0729 05/24/16 1137 05/24/16 1740 05/24/16 2126 05/25/16 0743  GLUCAP 169* 166* 174* 246* 184*    Studies/Results: Dg Chest 2 View  Result Date: 05/23/2016 CLINICAL DATA:  Left-sided chest pain with shortness of breath EXAM: CHEST  2 VIEW COMPARISON:  05/16/2016 FINDINGS: Post sternotomy changes. Low lung volumes. No focal consolidation or effusion. Stable cardiomegaly with mild central congestion. No pneumothorax. IMPRESSION: 1. Cardiomegaly with mild central congestion 2. Low lung volume.  No edema or infiltrate. Electronically Signed   By: Jasmine Pang M.D.   On: 05/23/2016 15:57   Medications:  . amLODipine  10 mg Oral Daily  . aspirin EC  81 mg Oral Daily  . atorvastatin  80 mg Oral q1800  . calcitRIOL  0.25 mcg Oral Q T,Th,Sa-HD  .  calcium acetate  1,334 mg Oral TID WC  . clopidogrel  75 mg Oral Daily  . cyclobenzaprine  10 mg Oral QHS  . gabapentin  800 mg Oral BID  . heparin  5,000 Units Subcutaneous Q8H  . hydrOXYzine  25 mg Oral QHS  . insulin aspart  0-15 Units Subcutaneous TID WC  . insulin aspart  0-5 Units Subcutaneous QHS  . insulin aspart  10 Units Subcutaneous TID WC  . insulin glargine  40 Units Subcutaneous BID  . ketorolac  30 mg Intravenous Once  . levothyroxine  50 mcg Oral QAC breakfast  . metoprolol tartrate  50 mg Oral BID  . predniSONE  50 mg Oral Q breakfast   Hemodialysis prescription: [facility] Tuesday, Thursday, Saturday,  4 hours,  180 dialyzer,  BFR 400/DFR800,  left radiocephalic fistula,  EDW 115 kg,  2K/2.5 calcium,  no UF profile,  no sodium modeling. Calcitriol 0.25 g every dialysis,  Venofer 100 mg every dialysis,  Mircera 100 g every 2 weeks  Background: 46 year old Caucasian man with PMH ESRD (PD->HD last adm/PD cath out), DM2, HTN,  hypothyroidism, CAD s/p CABG 09/2015, admitted with worsening substernal chest pain after dialysis (started on Sunday but he decided to ignore it for a while because he had just left the hospital on Friday of last week). Outpt HD in [place] abbreviated after 1'45" on 5/22 2/2 pain and sent to the ED.   Assessment/Plan: 1. Substernal chest pain: CABG S/P CABG 09/2015 [place] neg. Pain not really anginal. Seems very MSK to me (movement/deep breathing). No rub. Cards plans ECHO and if OK, then probably home with NSAIDS. IV toradol and oral prednisone ordered by primary team (has not received either as of yet) 2. CAD s/p CABG - as above.   3. ESRD - Usual TTS. HD again today to get back on schedule. 4. Anemia - ESA as outpt 5. Secondary HPT - binders resumed ca acetate). Calcitriol w/his regular HD TMTS 6. Mediastinal paratracheal lymphadenopathy - 2 nodes seen on scan from last week  Camille [name] Catelin Manthe, MD [facility] [phone] pager 05/25/2016, 10:04 AM

## 2016-05-25 NOTE — Plan of Care (Signed)
Problem: Activity: Goal: Risk for activity intolerance will decrease Outcome: Adequate for Discharge Patient able to ambulate in the room and tolerate well,

## 2016-05-27 DIAGNOSIS — N186 End stage renal disease: Secondary | ICD-10-CM | POA: Diagnosis not present

## 2016-05-27 DIAGNOSIS — N2581 Secondary hyperparathyroidism of renal origin: Secondary | ICD-10-CM | POA: Diagnosis not present

## 2016-05-27 DIAGNOSIS — E211 Secondary hyperparathyroidism, not elsewhere classified: Secondary | ICD-10-CM | POA: Diagnosis not present

## 2016-05-27 DIAGNOSIS — D631 Anemia in chronic kidney disease: Secondary | ICD-10-CM | POA: Diagnosis not present

## 2016-05-30 DIAGNOSIS — E211 Secondary hyperparathyroidism, not elsewhere classified: Secondary | ICD-10-CM | POA: Diagnosis not present

## 2016-05-30 DIAGNOSIS — N186 End stage renal disease: Secondary | ICD-10-CM | POA: Diagnosis not present

## 2016-05-30 DIAGNOSIS — N2581 Secondary hyperparathyroidism of renal origin: Secondary | ICD-10-CM | POA: Diagnosis not present

## 2016-05-30 DIAGNOSIS — D631 Anemia in chronic kidney disease: Secondary | ICD-10-CM | POA: Diagnosis not present

## 2016-05-30 NOTE — Discharge Summary (Signed)
[facility] Discharge Summary   Patient: Russell [name] ZOX:[id number]   PCP: Shade Flood, MD DOB: 04-08-1970   Date of admission: 05/23/2016   Date of discharge: 05/25/2016    Discharge Diagnoses:  Principal Problem:   Chest pain Active Problems:   Diabetic neuropathy (HCC)   Uncontrolled diabetes mellitus type 2 with peripheral artery disease (HCC)   Morbid obesity (HCC)   Hx of gastroesophageal reflux (GERD)   Essential hypertension, benign   Nephrotic syndrome   ESRD on dialysis (HCC)   S/P CABG (coronary artery bypass graft)   Anemia due to end stage renal disease (HCC)   Admitted From: home Disposition:  home  Recommendations for Outpatient Follow-up:  1. Please follow up with PCP in 1 week   Follow-up Information    Shade Flood, MD. Schedule an appointment as soon as possible for a visit in 1 week(s).   Specialties:  Family Medicine, Sports Medicine Why:  follow up on PET scan.  Contact information: [address] [place] [address] [phone]        Lewayne Bunting, MD. Schedule an appointment as soon as possible for a visit in 1 month(s).   Specialty:  Cardiology Contact information: [address] [place] [address] [phone]          Diet recommendation: renal diet  Activity: The patient is advised to gradually reintroduce usual activities.  Discharge Condition: good  Code Status: full code  History of present illness: As per the H and P dictated on admission, " Russell [name] is a 46 y.o. male with medical history significant for hypertension, insulin-dependent diabetes mellitus, hypothyroidism, CAD, and ESRD on HD TTS, now presenting to the emergency department for evaluation of chest pain. Patient reports that he had been in his usual state of health until the insidious development of pain in the central chest approximately 2 days ago. Since that time, pain is becoming more severe and more constant. He  describes it as moderate to severe in intensity, sharp in character, constant but waxing and waning in intensity, associated with mild dyspnea, and with no alleviating or exacerbating factors identified. He denies any associated nausea or diaphoresis, and notes some radiation of the pain to the left lateral chest wall. The patient denies any recent fevers or chills and denies any significant cough. He reports improved lower extremity edema and denies any significant orthopnea. Denies any recent trauma. He denies experiencing similar symptoms previously. He reports experiencing a different type of chest pain previously, but not since his CABG in September 2017. He had undergone CTA chest earlier this month which was negative for PE. He went to dialysis today and completed his session, but reports that the chest pain began to worsen with dialysis and so he came into the ED with EMS afterwards. He was treated with 324 mg aspirin and 2 doses of sublingual nitroglycerin en route, but reports no significant improvement in his symptoms with this."  Hospital Course:  Summary of his active problems in the hospital is as following. 1. Chest pain, pleuritic in nature. EKG unremarkable, serial troponins negative. Cardiology was consulted. No further workup recommended other than echocardiogram at present. Cardiology recommended Echocardiogram but later on felt it was not indicated as the pt's pain was non cardiac in nature.  very likely represent mild pericarditis/pleuritis given patient's recent transition from PD to HD during which he may have received less so than usual ultra filtrate due to being new  on HD.  2. ESRD. acute on chronic diastolic dysfunction. Patient was recently converted from PD to HD.  although his shortness of breath as well as swelling has improved, he is above his dry weight and nephrology is recommending to continue hemodialysis for another session today and then regular session after  that. Continue daily ins and outs and renal diet. Appreciate nephrology input.  3. Type 2 insulin-dependent diabetes mellitus. A1c was significantly elevated 12.8 in May 2018. Continuing home regimen at present. Continue sliding scale as well.  4. Hypertension. blood pressures are stable, continue Lopressor.  5. Anemia of chronic kidney disease. Continue close monitoring at present.  All other chronic medical condition were stable during the hospitalization.  Patient was ambulatory without any assistance. On the day of the discharge the patient's vitals were stable, and no other acute medical condition were reported by patient. the patient was felt safe to be discharge at home with family.  Procedures and Results:  HD   Consultations:  Cardiology  Nephrology   DISCHARGE MEDICATION: Discharge Medication List as of 05/25/2016  6:41 PM    START taking these medications   Details  ibuprofen (ADVIL,MOTRIN) 800 MG tablet Take 1 tablet (800 mg total) by mouth 2 (two) times daily., Starting Thu 05/25/2016, Until Thu 06/01/2016, Normal    pantoprazole (PROTONIX) 40 MG tablet Take 1 tablet (40 mg total) by mouth daily., Starting Thu 05/25/2016, Until Thu 06/08/2016, Normal      CONTINUE these medications which have NOT CHANGED   Details  acetaminophen (TYLENOL) 500 MG tablet Take 1 tablet (500 mg total) by mouth every 6 (six) hours as needed for moderate pain., Starting 09/29/2014, Until Discontinued, Normal    albuterol (PROVENTIL HFA;VENTOLIN HFA) 108 (90 Base) MCG/ACT inhaler Inhale 1 puff into the lungs every 6 (six) hours as needed for wheezing or shortness of breath., Starting 03/11/2015, Until Discontinued, Normal    albuterol (PROVENTIL) (2.5 MG/3ML) 0.083% nebulizer solution Take 2.5 mg by nebulization every 6 (six) hours as needed for wheezing., Historical Med    albuterol-ipratropium (COMBIVENT) 18-103 MCG/ACT inhaler Inhale 2 puffs into the lungs every 4 (four) hours.,  Starting 09/03/2014, Until Discontinued, Normal    amLODipine (NORVASC) 10 MG tablet Take 1 tablet (10 mg total) by mouth daily., Starting Mon 04/17/2016, Normal    aspirin EC 81 MG EC tablet Take 1 tablet (81 mg total) by mouth daily., Starting Wed 10/06/2015, No Print    atorvastatin (LIPITOR) 80 MG tablet Take 1 tablet (80 mg total) by mouth daily at 6 PM., Starting Mon 04/17/2016, Normal    calcitRIOL (ROCALTROL) 0.25 MCG capsule Take 1 capsule (0.25 mcg total) by mouth every Monday, Wednesday, and Friday with hemodialysis., Starting Wed 10/06/2015, Print    calcium acetate (PHOSLO) 667 MG capsule Take 2 capsules (1,334 mg total) by mouth 3 (three) times daily with meals., Starting 01/08/2015, Until Discontinued, Normal    clopidogrel (PLAVIX) 75 MG tablet Take 1 tablet (75 mg total) by mouth daily., Starting Thu 01/27/2016, Normal    cyclobenzaprine (FLEXERIL) 10 MG tablet Take 1 tablet (10 mg total) by mouth at bedtime., Starting 05/22/2015, Until Discontinued, Normal    gabapentin (NEURONTIN) 100 MG capsule Take 2 capsules (200 mg total) by mouth 3 (three) times daily., Starting 05/22/2015, Until Discontinued, Normal    HYDROmorphone (DILAUDID) 2 MG tablet Take 2 mg by mouth every 4 (four) hours as needed for severe pain., Historical Med    hydrOXYzine (ATARAX/VISTARIL) 25 MG tablet Take 25  mg by mouth at bedtime. , Historical Med    insulin glargine (LANTUS) 100 UNIT/ML injection Inject 0.4 mLs (40 Units total) into the skin 2 (two) times daily., Starting Sat 05/20/2016, No Print    insulin lispro (HUMALOG) 100 UNIT/ML injection Inject 0.15-0.3 mLs (15-30 Units total) into the skin every evening. Take one unit per 5 carbs depending on dinner., Starting Mon 11/29/2015, Normal    levothyroxine (SYNTHROID, LEVOTHROID) 50 MCG tablet Take 1 tablet (50 mcg total) by mouth daily., Starting 05/22/2015, Until Discontinued, Normal    metoCLOPramide (REGLAN) 5 MG tablet Take 1 tablet (5 mg total) by  mouth every 8 (eight) hours as needed for nausea or vomiting., Starting Mon 01/10/2016, Print    metoprolol (LOPRESSOR) 50 MG tablet Take 50 mg by mouth 2 (two) times daily., Historical Med       Allergies  Allergen Reactions  . Ibuprofen Other (See Comments)    MD told patient not to take due to kidney disease  . Nsaids Other (See Comments)    Told to avoid all nsaids due to kidney disease   . Tape Other (See Comments)    Welts result, if left for a long amount of time   Discharge Instructions    Diet renal [id number]    Complete by:  As directed    Discharge instructions    Complete by:  As directed    It is important that you read following instructions as well as go over your medication list with RN to help you understand your care after this hospitalization.  Discharge Instructions: Please follow-up with PCP in one week  Please request your primary care physician to go over all Hospital Tests and Procedure/Radiological results at the follow up,  Please get all Hospital records sent to your PCP by signing hospital release before you go home.   Do not drive, operating heavy machinery, perform activities at heights, swimming or participation in water activities or provide baby sitting services while you are on Pain, Sleep and Anxiety Medications; until you have been seen by Primary Care Physician or a Neurologist and advised to do so again. Do not take more than prescribed Pain, Sleep and Anxiety Medications. You were cared for by a hospitalist during your hospital stay. If you have any questions about your discharge medications or the care you received while you were in the hospital after you are discharged, you can call the unit and ask to speak with the hospitalist on call if the hospitalist that took care of you is not available.  Once you are discharged, your primary care physician will handle any further medical issues. Please note that NO REFILLS for any discharge  medications will be authorized once you are discharged, as it is imperative that you return to your primary care physician (or establish a relationship with a primary care physician if you do not have one) for your aftercare needs so that they can reassess your need for medications and monitor your lab values. You Must read complete instructions/literature along with all the possible adverse reactions/side effects for all the Medicines you take and that have been prescribed to you. Take any new Medicines after you have completely understood and accept all the possible adverse reactions/side effects. Wear Seat belts while driving. If you have smoked or chewed Tobacco in the last 2 yrs please stop smoking and/or stop any Recreational drug use.   Increase activity slowly    Complete by:  As directed  Discharge Exam: Filed Weights   05/25/16 1610 05/25/16 1345 05/25/16 1800  Weight: 121.5 kg (267 lb 14.4 oz) 123.2 kg (271 lb 9.7 oz) 119.2 kg (262 lb 12.6 oz)   Vitals:   05/25/16 1730 05/25/16 1800  BP: 112/75 133/75  Pulse: 96 (!) 101  Resp:  18  Temp:  97.6 F (36.4 C)   General: Appear in mild distress, no Rash; Oral Mucosa moist. Cardiovascular: S1 and S2 Present, no Murmur, no JVD Respiratory: Bilateral Air entry present and Clear to Auscultation, no Crackles, no wheezes Abdomen: Bowel Sound present, Soft and no tenderness Extremities: no Pedal edema, no calf tenderness Neurology: Grossly no focal neuro deficit.  The results of significant diagnostics from this hospitalization (including imaging, microbiology, ancillary and laboratory) are listed below for reference.    Significant Diagnostic Studies: Dg Chest 2 View  Result Date: 05/23/2016 CLINICAL DATA:  Left-sided chest pain with shortness of breath EXAM: CHEST  2 VIEW COMPARISON:  05/16/2016 FINDINGS: Post sternotomy changes. Low lung volumes. No focal consolidation or effusion. Stable cardiomegaly with mild central  congestion. No pneumothorax. IMPRESSION: 1. Cardiomegaly with mild central congestion 2. Low lung volume.  No edema or infiltrate. Electronically Signed   By: Jasmine Pang M.D.   On: 05/23/2016 15:57   Ct Angio Chest Pe W Or Wo Contrast  Result Date: 05/16/2016 CLINICAL DATA:  Shortness of breath, centralized chest pain radiating to the left, starting yesterday. Hypoxia. History of DVT, hypertension, CABG. EXAM: CT ANGIOGRAPHY CHEST WITH CONTRAST TECHNIQUE: Multidetector CT imaging of the chest was performed using the standard protocol during bolus administration of intravenous contrast. Multiplanar CT image reconstructions and MIPs were obtained to evaluate the vascular anatomy. CONTRAST:  100 cc Isovue 370 COMPARISON:  CTA chest dated 07/19/2015. FINDINGS: Cardiovascular: The peripheral segmental and subsegmental pulmonary arteries cannot be definitively characterized due to patient breathing motion artifact. There is no central obstructing pulmonary embolism identified within the main or central lobar pulmonary arteries bilaterally. No obstructing pulmonary embolism identified within the central segmental pulmonary arteries. No aortic aneurysm or dissection. There is mild cardiomegaly. No pericardial effusion. Coronary artery calcifications noted. Patient is status post CABG. Mediastinum/Nodes: Enlarged lymph nodes noted within the right paratracheal mediastinum. Lymph node measuring 1.6 cm short axis dimension is seen on series 5, image 21. Additional enlarged lymph node is seen within the more inferior right paratracheal mediastinum measuring 1.9 cm short axis dimension on series 5, image 27. These lymph nodes measured 8 mm and 10 mm short axis dimension respectively on CT of 07/19/2015. Esophagus appears normal. Trachea and central bronchi are unremarkable. Lungs/Pleura: Scattered ground-glass opacities bilaterally, perihilar predominant, most likely pulmonary edema. No pleural effusion or pneumothorax  seen. Mild atelectasis at the left lung base. Upper Abdomen: No acute abnormality. Musculoskeletal: No chest wall abnormality. No acute or significant osseous findings. Review of the MIP images confirms the above findings. IMPRESSION: 1. No pulmonary embolism seen, with study limitations detailed above. The most peripheral segmental and subsegmental pulmonary arteries cannot be definitively characterized due to patient breathing motion artifact. No central obstructing pulmonary embolism identified. 2. Scattered ground-glass opacities bilaterally, perihilar predominant, most likely pulmonary edema related to CHF/volume overload. Differential for ground-glass opacities also includes atypical pneumonias such as viral or fungal, interstitial pneumonias, chronic interstitial diseases, hypersensitivity pneumonitis, and respiratory bronchiolitis. 3. Cardiomegaly. 4. **An incidental finding of potential clinical significance has been found. Interval enlargement of 2 lymph nodes in the right paratracheal mediastinum, measurements and positions given above, largest  measuring 1.9 cm short axis dimension, both pathologic by CT size criteria. Neoplastic lymphadenopathy cannot be excluded. Consider further characterization with nonemergent PET-CT. ** Electronically Signed   By: Bary Richard M.D.   On: 05/16/2016 18:44   Dg Chest Port 1 View  Result Date: 05/16/2016 CLINICAL DATA:  Shortness of breath.  Left-sided chest pain. EXAM: PORTABLE CHEST 1 VIEW COMPARISON:  March 09, 2016 FINDINGS: Cardiomegaly. The hila and mediastinum are normal. Mild interstitial prominence. No focal infiltrate. IMPRESSION: Cardiomegaly. Mild interstitial prominence suggesting pulmonary venous congestion. No focal infiltrate identified. Electronically Signed   By: Gerome Sam III M.D   On: 05/16/2016 18:45    Microbiology: No results found for this or any previous visit (from the past 240 hour(s)).   Labs: CBC:  Recent Labs Lab  05/23/16 1558 05/24/16 0815 05/24/16 1312  WBC 6.2 6.9 7.7  HGB 8.8* 8.9* 9.2*  HCT 27.3* 28.6* 29.0*  MCV 86.9 87.7 86.6  PLT 321 322 331   Basic Metabolic Panel:  Recent Labs Lab 05/23/16 1558 05/24/16 0815 05/24/16 1312 05/25/16 0354  NA 136 139 139 137  K 4.3 4.8 4.8 4.5  CL 97* 98* 100* 98*  CO2 29 26 25 29   GLUCOSE 259* 158* 237* 267*  BUN 38* 50* 50* 28*  CREATININE 7.17* 8.97* 9.02* 5.79*  CALCIUM 7.8* 7.9* 7.8* 8.2*  PHOS  --   --  9.6* 6.7*   Liver Function Tests:  Recent Labs Lab 05/24/16 1312 05/25/16 0354  ALBUMIN 3.0* 3.0*   No results for input(s): LIPASE, AMYLASE in the last 168 hours. No results for input(s): AMMONIA in the last 168 hours. Cardiac Enzymes:  Recent Labs Lab 05/23/16 2028 05/24/16 0202 05/24/16 0815  TROPONINI <0.03 <0.03 <0.03   BNP (last 3 results)  Recent Labs  01/10/16 1834 05/16/16 1740 05/23/16 1558  BNP 182.8* 662.2* 299.0*   CBG:  Recent Labs Lab 05/24/16 1137 05/24/16 1740 05/24/16 2126 05/25/16 0743 05/25/16 1125  GLUCAP 166* 174* 246* 184* 176*   Time spent: 35 minutes  Signed:  Ginnifer Creelman  [facility] 05/25/2016 , 7:05 AM

## 2016-06-01 DIAGNOSIS — D631 Anemia in chronic kidney disease: Secondary | ICD-10-CM | POA: Diagnosis not present

## 2016-06-01 DIAGNOSIS — N186 End stage renal disease: Secondary | ICD-10-CM | POA: Diagnosis not present

## 2016-06-01 DIAGNOSIS — E211 Secondary hyperparathyroidism, not elsewhere classified: Secondary | ICD-10-CM | POA: Diagnosis not present

## 2016-06-01 DIAGNOSIS — N2581 Secondary hyperparathyroidism of renal origin: Secondary | ICD-10-CM | POA: Diagnosis not present

## 2016-06-01 DIAGNOSIS — Z992 Dependence on renal dialysis: Secondary | ICD-10-CM | POA: Diagnosis not present

## 2016-06-01 DIAGNOSIS — N049 Nephrotic syndrome with unspecified morphologic changes: Secondary | ICD-10-CM | POA: Diagnosis not present

## 2016-06-03 DIAGNOSIS — N2589 Other disorders resulting from impaired renal tubular function: Secondary | ICD-10-CM | POA: Diagnosis not present

## 2016-06-03 DIAGNOSIS — N186 End stage renal disease: Secondary | ICD-10-CM | POA: Diagnosis not present

## 2016-06-03 DIAGNOSIS — N2581 Secondary hyperparathyroidism of renal origin: Secondary | ICD-10-CM | POA: Diagnosis not present

## 2016-06-03 DIAGNOSIS — E211 Secondary hyperparathyroidism, not elsewhere classified: Secondary | ICD-10-CM | POA: Diagnosis not present

## 2016-06-03 DIAGNOSIS — E878 Other disorders of electrolyte and fluid balance, not elsewhere classified: Secondary | ICD-10-CM | POA: Diagnosis not present

## 2016-06-03 DIAGNOSIS — D631 Anemia in chronic kidney disease: Secondary | ICD-10-CM | POA: Diagnosis not present

## 2016-06-03 DIAGNOSIS — D509 Iron deficiency anemia, unspecified: Secondary | ICD-10-CM | POA: Diagnosis not present

## 2016-06-03 DIAGNOSIS — K769 Liver disease, unspecified: Secondary | ICD-10-CM | POA: Diagnosis not present

## 2016-06-03 DIAGNOSIS — E440 Moderate protein-calorie malnutrition: Secondary | ICD-10-CM | POA: Diagnosis not present

## 2016-06-06 DIAGNOSIS — D631 Anemia in chronic kidney disease: Secondary | ICD-10-CM | POA: Diagnosis not present

## 2016-06-06 DIAGNOSIS — E440 Moderate protein-calorie malnutrition: Secondary | ICD-10-CM | POA: Diagnosis not present

## 2016-06-06 DIAGNOSIS — E878 Other disorders of electrolyte and fluid balance, not elsewhere classified: Secondary | ICD-10-CM | POA: Diagnosis not present

## 2016-06-06 DIAGNOSIS — N186 End stage renal disease: Secondary | ICD-10-CM | POA: Diagnosis not present

## 2016-06-06 DIAGNOSIS — K769 Liver disease, unspecified: Secondary | ICD-10-CM | POA: Diagnosis not present

## 2016-06-06 DIAGNOSIS — E211 Secondary hyperparathyroidism, not elsewhere classified: Secondary | ICD-10-CM | POA: Diagnosis not present

## 2016-06-08 DIAGNOSIS — E1129 Type 2 diabetes mellitus with other diabetic kidney complication: Secondary | ICD-10-CM | POA: Diagnosis not present

## 2016-06-08 DIAGNOSIS — E211 Secondary hyperparathyroidism, not elsewhere classified: Secondary | ICD-10-CM | POA: Diagnosis not present

## 2016-06-08 DIAGNOSIS — D631 Anemia in chronic kidney disease: Secondary | ICD-10-CM | POA: Diagnosis not present

## 2016-06-08 DIAGNOSIS — K769 Liver disease, unspecified: Secondary | ICD-10-CM | POA: Diagnosis not present

## 2016-06-08 DIAGNOSIS — E784 Other hyperlipidemia: Secondary | ICD-10-CM | POA: Diagnosis not present

## 2016-06-08 DIAGNOSIS — N186 End stage renal disease: Secondary | ICD-10-CM | POA: Diagnosis not present

## 2016-06-08 DIAGNOSIS — E440 Moderate protein-calorie malnutrition: Secondary | ICD-10-CM | POA: Diagnosis not present

## 2016-06-08 DIAGNOSIS — E878 Other disorders of electrolyte and fluid balance, not elsewhere classified: Secondary | ICD-10-CM | POA: Diagnosis not present

## 2016-06-10 DIAGNOSIS — N186 End stage renal disease: Secondary | ICD-10-CM | POA: Diagnosis not present

## 2016-06-10 DIAGNOSIS — K769 Liver disease, unspecified: Secondary | ICD-10-CM | POA: Diagnosis not present

## 2016-06-10 DIAGNOSIS — E440 Moderate protein-calorie malnutrition: Secondary | ICD-10-CM | POA: Diagnosis not present

## 2016-06-10 DIAGNOSIS — D631 Anemia in chronic kidney disease: Secondary | ICD-10-CM | POA: Diagnosis not present

## 2016-06-10 DIAGNOSIS — E211 Secondary hyperparathyroidism, not elsewhere classified: Secondary | ICD-10-CM | POA: Diagnosis not present

## 2016-06-10 DIAGNOSIS — E878 Other disorders of electrolyte and fluid balance, not elsewhere classified: Secondary | ICD-10-CM | POA: Diagnosis not present

## 2016-06-13 DIAGNOSIS — E211 Secondary hyperparathyroidism, not elsewhere classified: Secondary | ICD-10-CM | POA: Diagnosis not present

## 2016-06-13 DIAGNOSIS — D631 Anemia in chronic kidney disease: Secondary | ICD-10-CM | POA: Diagnosis not present

## 2016-06-13 DIAGNOSIS — N186 End stage renal disease: Secondary | ICD-10-CM | POA: Diagnosis not present

## 2016-06-13 DIAGNOSIS — K769 Liver disease, unspecified: Secondary | ICD-10-CM | POA: Diagnosis not present

## 2016-06-13 DIAGNOSIS — E878 Other disorders of electrolyte and fluid balance, not elsewhere classified: Secondary | ICD-10-CM | POA: Diagnosis not present

## 2016-06-13 DIAGNOSIS — E440 Moderate protein-calorie malnutrition: Secondary | ICD-10-CM | POA: Diagnosis not present

## 2016-06-15 DIAGNOSIS — K769 Liver disease, unspecified: Secondary | ICD-10-CM | POA: Diagnosis not present

## 2016-06-15 DIAGNOSIS — D631 Anemia in chronic kidney disease: Secondary | ICD-10-CM | POA: Diagnosis not present

## 2016-06-15 DIAGNOSIS — E878 Other disorders of electrolyte and fluid balance, not elsewhere classified: Secondary | ICD-10-CM | POA: Diagnosis not present

## 2016-06-15 DIAGNOSIS — E211 Secondary hyperparathyroidism, not elsewhere classified: Secondary | ICD-10-CM | POA: Diagnosis not present

## 2016-06-15 DIAGNOSIS — N186 End stage renal disease: Secondary | ICD-10-CM | POA: Diagnosis not present

## 2016-06-15 DIAGNOSIS — E440 Moderate protein-calorie malnutrition: Secondary | ICD-10-CM | POA: Diagnosis not present

## 2016-06-20 DIAGNOSIS — E878 Other disorders of electrolyte and fluid balance, not elsewhere classified: Secondary | ICD-10-CM | POA: Diagnosis not present

## 2016-06-20 DIAGNOSIS — K769 Liver disease, unspecified: Secondary | ICD-10-CM | POA: Diagnosis not present

## 2016-06-20 DIAGNOSIS — E440 Moderate protein-calorie malnutrition: Secondary | ICD-10-CM | POA: Diagnosis not present

## 2016-06-20 DIAGNOSIS — D631 Anemia in chronic kidney disease: Secondary | ICD-10-CM | POA: Diagnosis not present

## 2016-06-20 DIAGNOSIS — N186 End stage renal disease: Secondary | ICD-10-CM | POA: Diagnosis not present

## 2016-06-20 DIAGNOSIS — E211 Secondary hyperparathyroidism, not elsewhere classified: Secondary | ICD-10-CM | POA: Diagnosis not present

## 2016-06-21 DIAGNOSIS — N186 End stage renal disease: Secondary | ICD-10-CM | POA: Diagnosis not present

## 2016-06-21 DIAGNOSIS — K769 Liver disease, unspecified: Secondary | ICD-10-CM | POA: Diagnosis not present

## 2016-06-21 DIAGNOSIS — D631 Anemia in chronic kidney disease: Secondary | ICD-10-CM | POA: Diagnosis not present

## 2016-06-21 DIAGNOSIS — E440 Moderate protein-calorie malnutrition: Secondary | ICD-10-CM | POA: Diagnosis not present

## 2016-06-21 DIAGNOSIS — E211 Secondary hyperparathyroidism, not elsewhere classified: Secondary | ICD-10-CM | POA: Diagnosis not present

## 2016-06-21 DIAGNOSIS — E878 Other disorders of electrolyte and fluid balance, not elsewhere classified: Secondary | ICD-10-CM | POA: Diagnosis not present

## 2016-06-22 DIAGNOSIS — N186 End stage renal disease: Secondary | ICD-10-CM | POA: Diagnosis not present

## 2016-06-22 DIAGNOSIS — E440 Moderate protein-calorie malnutrition: Secondary | ICD-10-CM | POA: Diagnosis not present

## 2016-06-22 DIAGNOSIS — D631 Anemia in chronic kidney disease: Secondary | ICD-10-CM | POA: Diagnosis not present

## 2016-06-22 DIAGNOSIS — E211 Secondary hyperparathyroidism, not elsewhere classified: Secondary | ICD-10-CM | POA: Diagnosis not present

## 2016-06-22 DIAGNOSIS — K769 Liver disease, unspecified: Secondary | ICD-10-CM | POA: Diagnosis not present

## 2016-06-22 DIAGNOSIS — E878 Other disorders of electrolyte and fluid balance, not elsewhere classified: Secondary | ICD-10-CM | POA: Diagnosis not present

## 2016-06-26 DIAGNOSIS — E211 Secondary hyperparathyroidism, not elsewhere classified: Secondary | ICD-10-CM | POA: Diagnosis not present

## 2016-06-26 DIAGNOSIS — E440 Moderate protein-calorie malnutrition: Secondary | ICD-10-CM | POA: Diagnosis not present

## 2016-06-26 DIAGNOSIS — N186 End stage renal disease: Secondary | ICD-10-CM | POA: Diagnosis not present

## 2016-06-26 DIAGNOSIS — E878 Other disorders of electrolyte and fluid balance, not elsewhere classified: Secondary | ICD-10-CM | POA: Diagnosis not present

## 2016-06-26 DIAGNOSIS — D631 Anemia in chronic kidney disease: Secondary | ICD-10-CM | POA: Diagnosis not present

## 2016-06-26 DIAGNOSIS — K769 Liver disease, unspecified: Secondary | ICD-10-CM | POA: Diagnosis not present

## 2016-06-27 ENCOUNTER — Telehealth: Payer: Self-pay | Admitting: Family Medicine

## 2016-06-27 DIAGNOSIS — R9389 Abnormal findings on diagnostic imaging of other specified body structures: Secondary | ICD-10-CM

## 2016-06-27 DIAGNOSIS — R599 Enlarged lymph nodes, unspecified: Secondary | ICD-10-CM

## 2016-06-27 NOTE — Telephone Encounter (Signed)
Please advise 

## 2016-06-27 NOTE — Telephone Encounter (Signed)
Pt is requesting that Dr. Neva SeatGreene order him a PET scan and he was wanting to know if this can be done with out him coming in for an appt or does he need to schedule one   Best number [phone]

## 2016-06-28 DIAGNOSIS — E784 Other hyperlipidemia: Secondary | ICD-10-CM | POA: Diagnosis not present

## 2016-06-28 DIAGNOSIS — D631 Anemia in chronic kidney disease: Secondary | ICD-10-CM | POA: Diagnosis not present

## 2016-06-28 DIAGNOSIS — E211 Secondary hyperparathyroidism, not elsewhere classified: Secondary | ICD-10-CM | POA: Diagnosis not present

## 2016-06-28 DIAGNOSIS — E440 Moderate protein-calorie malnutrition: Secondary | ICD-10-CM | POA: Diagnosis not present

## 2016-06-28 DIAGNOSIS — K769 Liver disease, unspecified: Secondary | ICD-10-CM | POA: Diagnosis not present

## 2016-06-28 DIAGNOSIS — E878 Other disorders of electrolyte and fluid balance, not elsewhere classified: Secondary | ICD-10-CM | POA: Diagnosis not present

## 2016-06-28 DIAGNOSIS — N186 End stage renal disease: Secondary | ICD-10-CM | POA: Diagnosis not present

## 2016-06-28 NOTE — Telephone Encounter (Signed)
I do not mind ordering that test, but I do not see PET-CT as a specific order. [facility] how I need to order that test? I have included the recommendations from previous CT scan to help with that ordering process if needed:  "An incidental finding of potential clinical significance has been found. Interval enlargement of 2 lymph nodes in the right paratracheal mediastinum, measurements and positions given above, largest measuring 1.9 cm short axis dimension, both pathologic by CT size criteria. Neoplastic lymphadenopathy cannot be excluded. Consider further characterization with nonemergent PET-CT."

## 2016-06-29 ENCOUNTER — Encounter (HOSPITAL_COMMUNITY): Payer: Self-pay | Admitting: Emergency Medicine

## 2016-06-29 ENCOUNTER — Emergency Department (HOSPITAL_COMMUNITY): Payer: Medicare Other

## 2016-06-29 ENCOUNTER — Inpatient Hospital Stay (HOSPITAL_COMMUNITY)
Admission: EM | Admit: 2016-06-29 | Discharge: 2016-06-30 | DRG: 637 | Disposition: A | Discharge status: Home / Self Care | Payer: Medicare Other | Attending: Internal Medicine | Admitting: Internal Medicine

## 2016-06-29 DIAGNOSIS — E039 Hypothyroidism, unspecified: Secondary | ICD-10-CM | POA: Diagnosis not present

## 2016-06-29 DIAGNOSIS — E1110 Type 2 diabetes mellitus with ketoacidosis without coma: Secondary | ICD-10-CM | POA: Diagnosis not present

## 2016-06-29 DIAGNOSIS — E1143 Type 2 diabetes mellitus with diabetic autonomic (poly)neuropathy: Secondary | ICD-10-CM | POA: Diagnosis present

## 2016-06-29 DIAGNOSIS — K219 Gastro-esophageal reflux disease without esophagitis: Secondary | ICD-10-CM | POA: Diagnosis present

## 2016-06-29 DIAGNOSIS — Z86718 Personal history of other venous thrombosis and embolism: Secondary | ICD-10-CM | POA: Diagnosis not present

## 2016-06-29 DIAGNOSIS — Z992 Dependence on renal dialysis: Secondary | ICD-10-CM

## 2016-06-29 DIAGNOSIS — E1151 Type 2 diabetes mellitus with diabetic peripheral angiopathy without gangrene: Secondary | ICD-10-CM | POA: Diagnosis not present

## 2016-06-29 DIAGNOSIS — I120 Hypertensive chronic kidney disease with stage 5 chronic kidney disease or end stage renal disease: Secondary | ICD-10-CM | POA: Diagnosis not present

## 2016-06-29 DIAGNOSIS — Z91048 Other nonmedicinal substance allergy status: Secondary | ICD-10-CM

## 2016-06-29 DIAGNOSIS — E1165 Type 2 diabetes mellitus with hyperglycemia: Secondary | ICD-10-CM

## 2016-06-29 DIAGNOSIS — K3184 Gastroparesis: Secondary | ICD-10-CM | POA: Diagnosis present

## 2016-06-29 DIAGNOSIS — R112 Nausea with vomiting, unspecified: Secondary | ICD-10-CM | POA: Diagnosis not present

## 2016-06-29 DIAGNOSIS — G4700 Insomnia, unspecified: Secondary | ICD-10-CM | POA: Diagnosis present

## 2016-06-29 DIAGNOSIS — E038 Other specified hypothyroidism: Secondary | ICD-10-CM

## 2016-06-29 DIAGNOSIS — R51 Headache: Secondary | ICD-10-CM | POA: Diagnosis not present

## 2016-06-29 DIAGNOSIS — Z806 Family history of leukemia: Secondary | ICD-10-CM

## 2016-06-29 DIAGNOSIS — I161 Hypertensive emergency: Secondary | ICD-10-CM | POA: Diagnosis present

## 2016-06-29 DIAGNOSIS — Z951 Presence of aortocoronary bypass graft: Secondary | ICD-10-CM

## 2016-06-29 DIAGNOSIS — E1140 Type 2 diabetes mellitus with diabetic neuropathy, unspecified: Secondary | ICD-10-CM | POA: Diagnosis not present

## 2016-06-29 DIAGNOSIS — E1121 Type 2 diabetes mellitus with diabetic nephropathy: Secondary | ICD-10-CM | POA: Diagnosis not present

## 2016-06-29 DIAGNOSIS — G8929 Other chronic pain: Secondary | ICD-10-CM | POA: Diagnosis present

## 2016-06-29 DIAGNOSIS — Z794 Long term (current) use of insulin: Secondary | ICD-10-CM

## 2016-06-29 DIAGNOSIS — S91319A Laceration without foreign body, unspecified foot, initial encounter: Secondary | ICD-10-CM

## 2016-06-29 DIAGNOSIS — Z7901 Long term (current) use of anticoagulants: Secondary | ICD-10-CM

## 2016-06-29 DIAGNOSIS — A084 Viral intestinal infection, unspecified: Secondary | ICD-10-CM | POA: Diagnosis present

## 2016-06-29 DIAGNOSIS — W268XXA Contact with other sharp object(s), not elsewhere classified, initial encounter: Secondary | ICD-10-CM | POA: Diagnosis not present

## 2016-06-29 DIAGNOSIS — Z87891 Personal history of nicotine dependence: Secondary | ICD-10-CM

## 2016-06-29 DIAGNOSIS — IMO0002 Reserved for concepts with insufficient information to code with codable children: Secondary | ICD-10-CM | POA: Diagnosis present

## 2016-06-29 DIAGNOSIS — I10 Essential (primary) hypertension: Secondary | ICD-10-CM | POA: Diagnosis not present

## 2016-06-29 DIAGNOSIS — J449 Chronic obstructive pulmonary disease, unspecified: Secondary | ICD-10-CM | POA: Diagnosis present

## 2016-06-29 DIAGNOSIS — E860 Dehydration: Secondary | ICD-10-CM | POA: Diagnosis not present

## 2016-06-29 DIAGNOSIS — R531 Weakness: Secondary | ICD-10-CM | POA: Diagnosis not present

## 2016-06-29 DIAGNOSIS — E1122 Type 2 diabetes mellitus with diabetic chronic kidney disease: Secondary | ICD-10-CM | POA: Diagnosis not present

## 2016-06-29 DIAGNOSIS — J4530 Mild persistent asthma, uncomplicated: Secondary | ICD-10-CM | POA: Diagnosis present

## 2016-06-29 DIAGNOSIS — Z7982 Long term (current) use of aspirin: Secondary | ICD-10-CM

## 2016-06-29 DIAGNOSIS — N2581 Secondary hyperparathyroidism of renal origin: Secondary | ICD-10-CM | POA: Diagnosis present

## 2016-06-29 DIAGNOSIS — D631 Anemia in chronic kidney disease: Secondary | ICD-10-CM | POA: Diagnosis not present

## 2016-06-29 DIAGNOSIS — D638 Anemia in other chronic diseases classified elsewhere: Secondary | ICD-10-CM | POA: Diagnosis present

## 2016-06-29 DIAGNOSIS — I2510 Atherosclerotic heart disease of native coronary artery without angina pectoris: Secondary | ICD-10-CM | POA: Diagnosis present

## 2016-06-29 DIAGNOSIS — Z7902 Long term (current) use of antithrombotics/antiplatelets: Secondary | ICD-10-CM

## 2016-06-29 DIAGNOSIS — S91311A Laceration without foreign body, right foot, initial encounter: Secondary | ICD-10-CM | POA: Diagnosis not present

## 2016-06-29 DIAGNOSIS — N186 End stage renal disease: Secondary | ICD-10-CM | POA: Diagnosis present

## 2016-06-29 DIAGNOSIS — R197 Diarrhea, unspecified: Secondary | ICD-10-CM

## 2016-06-29 DIAGNOSIS — R404 Transient alteration of awareness: Secondary | ICD-10-CM | POA: Diagnosis not present

## 2016-06-29 DIAGNOSIS — Z886 Allergy status to analgesic agent status: Secondary | ICD-10-CM

## 2016-06-29 DIAGNOSIS — D72829 Elevated white blood cell count, unspecified: Secondary | ICD-10-CM | POA: Diagnosis present

## 2016-06-29 DIAGNOSIS — Z8051 Family history of malignant neoplasm of kidney: Secondary | ICD-10-CM

## 2016-06-29 HISTORY — DX: Dependence on renal dialysis: N18.6

## 2016-06-29 HISTORY — DX: Dependence on renal dialysis: Z99.2

## 2016-06-29 LAB — BASIC METABOLIC PANEL
Anion gap: 15 (ref 5–15)
BUN: 38 mg/dL — AB (ref 6–20)
CO2: 25 mmol/L (ref 22–32)
Calcium: 9.4 mg/dL (ref 8.9–10.3)
Chloride: 98 mmol/L — ABNORMAL LOW (ref 101–111)
Creatinine, Ser: 7.35 mg/dL — ABNORMAL HIGH (ref 0.61–1.24)
GFR calc Af Amer: 9 mL/min — ABNORMAL LOW (ref >60)
GFR, EST NON AFRICAN AMERICAN: 8 mL/min — AB (ref >60)
GLUCOSE: 369 mg/dL — AB (ref 65–99)
POTASSIUM: 4.3 mmol/L (ref 3.5–5.1)
Sodium: 138 mmol/L (ref 135–145)

## 2016-06-29 LAB — CBC
HEMATOCRIT: 39 % (ref 39.0–52.0)
Hemoglobin: 12.7 g/dL — ABNORMAL LOW (ref 13.0–17.0)
MCH: 28.3 pg (ref 26.0–34.0)
MCHC: 32.6 g/dL (ref 30.0–36.0)
MCV: 86.9 fL (ref 78.0–100.0)
PLATELETS: 294 10*3/uL (ref 150–400)
RBC: 4.49 MIL/uL (ref 4.22–5.81)
RDW: 15.4 % (ref 11.5–15.5)
WBC: 10.7 10*3/uL — ABNORMAL HIGH (ref 4.0–10.5)

## 2016-06-29 LAB — COMPREHENSIVE METABOLIC PANEL
ALBUMIN: 4 g/dL (ref 3.5–5.0)
ALT: 16 U/L — ABNORMAL LOW (ref 17–63)
ANION GAP: 17 — AB (ref 5–15)
AST: 23 U/L (ref 15–41)
Alkaline Phosphatase: 98 U/L (ref 38–126)
BUN: 27 mg/dL — ABNORMAL HIGH (ref 6–20)
CHLORIDE: 93 mmol/L — AB (ref 101–111)
CO2: 26 mmol/L (ref 22–32)
Calcium: 9.9 mg/dL (ref 8.9–10.3)
Creatinine, Ser: 6.99 mg/dL — ABNORMAL HIGH (ref 0.61–1.24)
GFR calc Af Amer: 10 mL/min — ABNORMAL LOW (ref >60)
GFR calc non Af Amer: 8 mL/min — ABNORMAL LOW (ref >60)
GLUCOSE: 406 mg/dL — AB (ref 65–99)
POTASSIUM: 4.4 mmol/L (ref 3.5–5.1)
SODIUM: 136 mmol/L (ref 135–145)
Total Bilirubin: 1 mg/dL (ref 0.3–1.2)
Total Protein: 7.4 g/dL (ref 6.5–8.1)

## 2016-06-29 LAB — CBG MONITORING, ED
GLUCOSE-CAPILLARY: 379 mg/dL — AB (ref 65–99)
Glucose-Capillary: 361 mg/dL — ABNORMAL HIGH (ref 65–99)
Glucose-Capillary: 429 mg/dL — ABNORMAL HIGH (ref 65–99)
Glucose-Capillary: 418 mg/dL — ABNORMAL HIGH (ref 65–99)
Glucose-Capillary: 328 mg/dL — ABNORMAL HIGH (ref 65–99)

## 2016-06-29 LAB — PHOSPHORUS: Phosphorus: 5.4 mg/dL — ABNORMAL HIGH (ref 2.5–4.6)

## 2016-06-29 LAB — MAGNESIUM: Magnesium: 2.2 mg/dL (ref 1.7–2.4)

## 2016-06-29 LAB — GLUCOSE, CAPILLARY
Glucose-Capillary: 250 mg/dL — ABNORMAL HIGH (ref 65–99)
Glucose-Capillary: 270 mg/dL — ABNORMAL HIGH (ref 65–99)

## 2016-06-29 LAB — TROPONIN I: Troponin I: 0.04 ng/mL (ref <0.03)

## 2016-06-29 LAB — LIPASE, BLOOD: Lipase: 28 U/L (ref 11–51)

## 2016-06-29 MED ORDER — SODIUM CHLORIDE 0.9 % IV SOLN
8.0000 mg | Freq: Once | INTRAVENOUS | Status: AC
  Administered 2016-06-29: 8 mg via INTRAVENOUS
  Filled 2016-06-29: qty 4

## 2016-06-29 MED ORDER — SODIUM CHLORIDE 0.9 % IV BOLUS (SEPSIS): 1000.0000 mL | Freq: Once | INTRAVENOUS | Status: DC

## 2016-06-29 MED ORDER — SODIUM CHLORIDE 0.9 % IV SOLN
INTRAVENOUS | Status: DC
  Administered 2016-06-29: 3.7 [IU]/h via INTRAVENOUS
  Filled 2016-06-29: qty 1

## 2016-06-29 MED ORDER — INSULIN GLARGINE 100 UNIT/ML ~~LOC~~ SOLN
40.0000 [IU] | Freq: Two times a day (BID) | SUBCUTANEOUS | Status: DC
  Administered 2016-06-29 – 2016-06-30 (×3): 40 [IU] via SUBCUTANEOUS
  Filled 2016-06-29 (×5): qty 0.4

## 2016-06-29 MED ORDER — PROMETHAZINE HCL 25 MG/ML IJ SOLN
12.5000 mg | Freq: Once | INTRAMUSCULAR | Status: AC
Start: 2016-06-29 — End: 2016-06-29
  Administered 2016-06-29: 12.5 mg via INTRAVENOUS
  Filled 2016-06-29: qty 1

## 2016-06-29 MED ORDER — INSULIN ASPART 100 UNIT/ML ~~LOC~~ SOLN
0.0000 [IU] | Freq: Every day | SUBCUTANEOUS | Status: DC
  Administered 2016-06-29: 3 [IU] via SUBCUTANEOUS

## 2016-06-29 MED ORDER — CALCIUM ACETATE (PHOS BINDER) 667 MG PO CAPS
2668.0000 mg | ORAL_CAPSULE | Freq: Three times a day (TID) | ORAL | Status: DC
  Administered 2016-06-29 – 2016-06-30 (×2): 2668 mg via ORAL
  Filled 2016-06-29 (×2): qty 4

## 2016-06-29 MED ORDER — METOPROLOL TARTRATE 50 MG PO TABS
50.0000 mg | ORAL_TABLET | Freq: Two times a day (BID) | ORAL | Status: DC
  Administered 2016-06-29 – 2016-06-30 (×3): 50 mg via ORAL
  Filled 2016-06-29: qty 2
  Filled 2016-06-29 (×2): qty 1

## 2016-06-29 MED ORDER — ACETAMINOPHEN 325 MG PO TABS: 650.0000 mg | ORAL_TABLET | Freq: Four times a day (QID) | ORAL | Status: DC | PRN

## 2016-06-29 MED ORDER — HYDROXYZINE HCL 25 MG PO TABS
25.0000 mg | ORAL_TABLET | Freq: Every day | ORAL | Status: DC
  Administered 2016-06-29: 25 mg via ORAL
  Filled 2016-06-29: qty 1

## 2016-06-29 MED ORDER — METOCLOPRAMIDE HCL 5 MG/ML IJ SOLN
10.0000 mg | Freq: Once | INTRAMUSCULAR | Status: AC
  Administered 2016-06-29: 10 mg via INTRAVENOUS
  Filled 2016-06-29: qty 2

## 2016-06-29 MED ORDER — ATORVASTATIN CALCIUM 80 MG PO TABS
80.0000 mg | ORAL_TABLET | Freq: Every day | ORAL | Status: DC
  Administered 2016-06-29: 80 mg via ORAL
  Filled 2016-06-29 (×2): qty 1

## 2016-06-29 MED ORDER — MORPHINE SULFATE (PF) 4 MG/ML IV SOLN: 4.0000 mg | INTRAVENOUS | Status: DC | PRN

## 2016-06-29 MED ORDER — SODIUM CHLORIDE 0.9% FLUSH
3.0000 mL | Freq: Two times a day (BID) | INTRAVENOUS | Status: DC
  Administered 2016-06-29 – 2016-06-30 (×3): 3 mL via INTRAVENOUS

## 2016-06-29 MED ORDER — PANTOPRAZOLE SODIUM 40 MG PO TBEC
40.0000 mg | DELAYED_RELEASE_TABLET | Freq: Every day | ORAL | Status: DC
  Administered 2016-06-29 – 2016-06-30 (×2): 40 mg via ORAL
  Filled 2016-06-29 (×2): qty 1

## 2016-06-29 MED ORDER — SODIUM CHLORIDE 0.9 % IV SOLN: 250.0000 mL | INTRAVENOUS | Status: DC | PRN

## 2016-06-29 MED ORDER — SODIUM CHLORIDE 0.9% FLUSH: 3.0000 mL | INTRAVENOUS | Status: DC | PRN

## 2016-06-29 MED ORDER — ALBUTEROL SULFATE (2.5 MG/3ML) 0.083% IN NEBU: 2.5000 mg | INHALATION_SOLUTION | Freq: Four times a day (QID) | RESPIRATORY_TRACT | Status: DC | PRN

## 2016-06-29 MED ORDER — PROMETHAZINE HCL 25 MG/ML IJ SOLN
25.0000 mg | Freq: Three times a day (TID) | INTRAMUSCULAR | Status: DC | PRN
  Administered 2016-06-29: 25 mg via INTRAVENOUS
  Filled 2016-06-29: qty 1

## 2016-06-29 MED ORDER — LABETALOL HCL 5 MG/ML IV SOLN
10.0000 mg | Freq: Once | INTRAVENOUS | Status: AC
  Administered 2016-06-29: 10 mg via INTRAVENOUS
  Filled 2016-06-29: qty 4

## 2016-06-29 MED ORDER — ASPIRIN EC 81 MG PO TBEC
81.0000 mg | DELAYED_RELEASE_TABLET | Freq: Every day | ORAL | Status: DC
  Administered 2016-06-29 – 2016-06-30 (×2): 81 mg via ORAL
  Filled 2016-06-29 (×2): qty 1

## 2016-06-29 MED ORDER — HYDROMORPHONE HCL 2 MG PO TABS: 2.0000 mg | ORAL_TABLET | ORAL | Status: DC | PRN

## 2016-06-29 MED ORDER — FAMOTIDINE IN NACL 20-0.9 MG/50ML-% IV SOLN
20.0000 mg | Freq: Once | INTRAVENOUS | Status: AC
  Administered 2016-06-29: 20 mg via INTRAVENOUS
  Filled 2016-06-29: qty 50

## 2016-06-29 MED ORDER — ACETAMINOPHEN 650 MG RE SUPP: 650.0000 mg | Freq: Four times a day (QID) | RECTAL | Status: DC | PRN

## 2016-06-29 MED ORDER — PROMETHAZINE HCL 25 MG/ML IJ SOLN
12.5000 mg | Freq: Once | INTRAMUSCULAR | Status: AC
  Administered 2016-06-29: 12.5 mg via INTRAVENOUS
  Filled 2016-06-29: qty 1

## 2016-06-29 MED ORDER — METOCLOPRAMIDE HCL 5 MG/ML IJ SOLN
10.0000 mg | Freq: Three times a day (TID) | INTRAMUSCULAR | Status: DC
  Administered 2016-06-29 – 2016-06-30 (×4): 10 mg via INTRAVENOUS
  Filled 2016-06-29 (×4): qty 2

## 2016-06-29 MED ORDER — CALCITRIOL 0.25 MCG PO CAPS
0.2500 ug | ORAL_CAPSULE | ORAL | Status: DC
  Administered 2016-06-29: 0.25 ug via ORAL
  Filled 2016-06-29: qty 1

## 2016-06-29 MED ORDER — HYDRALAZINE HCL 20 MG/ML IJ SOLN
10.0000 mg | INTRAMUSCULAR | Status: AC
  Administered 2016-06-29: 10 mg via INTRAVENOUS
  Filled 2016-06-29: qty 1

## 2016-06-29 MED ORDER — INSULIN ASPART 100 UNIT/ML ~~LOC~~ SOLN
0.0000 [IU] | Freq: Three times a day (TID) | SUBCUTANEOUS | Status: DC
  Administered 2016-06-29: 11 [IU] via SUBCUTANEOUS
  Administered 2016-06-29: 5 [IU] via SUBCUTANEOUS
  Administered 2016-06-30: 8 [IU] via SUBCUTANEOUS
  Filled 2016-06-29: qty 1

## 2016-06-29 MED ORDER — IPRATROPIUM-ALBUTEROL 18-103 MCG/ACT IN AERO: 2.0000 | INHALATION_SPRAY | Freq: Four times a day (QID) | RESPIRATORY_TRACT | Status: DC | PRN

## 2016-06-29 MED ORDER — AMLODIPINE BESYLATE 10 MG PO TABS
10.0000 mg | ORAL_TABLET | Freq: Every day | ORAL | Status: DC
  Administered 2016-06-29 – 2016-06-30 (×2): 10 mg via ORAL
  Filled 2016-06-29: qty 1
  Filled 2016-06-29: qty 2

## 2016-06-29 MED ORDER — GABAPENTIN 400 MG PO CAPS
800.0000 mg | ORAL_CAPSULE | Freq: Two times a day (BID) | ORAL | Status: DC
  Administered 2016-06-29 – 2016-06-30 (×3): 800 mg via ORAL
  Filled 2016-06-29 (×3): qty 2

## 2016-06-29 MED ORDER — HEPARIN SODIUM (PORCINE) 5000 UNIT/ML IJ SOLN
5000.0000 [IU] | Freq: Three times a day (TID) | INTRAMUSCULAR | Status: DC
  Filled 2016-06-29 (×3): qty 1

## 2016-06-29 MED ORDER — SODIUM CHLORIDE 0.9 % IV BOLUS (SEPSIS)
500.0000 mL | Freq: Once | INTRAVENOUS | Status: AC
  Administered 2016-06-29: 500 mL via INTRAVENOUS

## 2016-06-29 MED ORDER — DIPHENHYDRAMINE HCL 50 MG/ML IJ SOLN
25.0000 mg | Freq: Once | INTRAMUSCULAR | Status: AC
  Administered 2016-06-29: 25 mg via INTRAVENOUS
  Filled 2016-06-29: qty 1

## 2016-06-29 MED ORDER — CYCLOBENZAPRINE HCL 10 MG PO TABS
10.0000 mg | ORAL_TABLET | Freq: Every day | ORAL | Status: DC
  Administered 2016-06-29: 10 mg via ORAL
  Filled 2016-06-29: qty 1

## 2016-06-29 MED ORDER — LEVOTHYROXINE SODIUM 50 MCG PO TABS
50.0000 ug | ORAL_TABLET | Freq: Every day | ORAL | Status: DC
  Administered 2016-06-30: 50 ug via ORAL
  Filled 2016-06-29 (×2): qty 1

## 2016-06-29 MED ORDER — SODIUM CHLORIDE 0.9 % IV BOLUS (SEPSIS)
1000.0000 mL | Freq: Once | INTRAVENOUS | Status: AC
  Administered 2016-06-29: 1000 mL via INTRAVENOUS

## 2016-06-29 MED ORDER — HYDRALAZINE HCL 20 MG/ML IJ SOLN
5.0000 mg | INTRAMUSCULAR | Status: DC | PRN
  Administered 2016-06-29: 10 mg via INTRAVENOUS
  Filled 2016-06-29: qty 1

## 2016-06-29 MED ORDER — SODIUM CHLORIDE 0.9 % IV SOLN
INTRAVENOUS | Status: DC
  Administered 2016-06-29: 07:00:00 via INTRAVENOUS

## 2016-06-29 MED ORDER — CLOPIDOGREL BISULFATE 75 MG PO TABS
75.0000 mg | ORAL_TABLET | Freq: Every day | ORAL | Status: DC
  Administered 2016-06-29 – 2016-06-30 (×2): 75 mg via ORAL
  Filled 2016-06-29 (×2): qty 1

## 2016-06-29 MED ORDER — CALCIUM ACETATE (PHOS BINDER) 667 MG PO CAPS: 1334.0000 mg | ORAL_CAPSULE | Freq: Three times a day (TID) | ORAL | Status: DC

## 2016-06-29 NOTE — Progress Notes (Signed)
Pt admitted to the unit at 1559. Pt mental status is A&O x 4. Pt oriented to room, staff, and call bell. Skin is intact except where otherwise charted. Full assessment charted in [facility]. Call bell within reach. Visitor guidelines reviewed w/ pt and/or family.

## 2016-06-29 NOTE — ED Triage Notes (Signed)
Per EMS, pt has been weak and vomiting ever since dialysis today.  Reports that dialysis went as normal, no problems.  Pt is diaphoric and pale but no reports of chest pain, SOB, fever or dizziness.

## 2016-06-29 NOTE — ED Notes (Signed)
Date and time results received: 06/29/16 12:34 PM   Test: Troponin  Critical Value: 0.04  Name of Provider Notified: Durwin Noraavid Mereal MD  Orders Received

## 2016-06-29 NOTE — H&P (Signed)
History and Physical    YARDEN HILLIS ZOX:[id number] DOB: Nov 30, 1970 DOA: 06/29/2016  PCP: Shade Flood, MD Patient coming from: home  Chief Complaint: NVD  HPI: Russell [name] is a 46 y.o. male with medical history significant of CAD, depression, DVT, ESRD on dialysis, hypertension, hypothyroidism. Patient presenting with one-day history of nausea, vomiting and diarrhea. Denies abdominal pain, fevers, dysuria, frequency, flank pain, chest pain, shortness breath, palpitations, cough. Patient states that he's had approximately 18 bouts of nonbloody emesis and approximately 10 episodes of loose nonbloody stools since onset of symptoms. Patient endorses compliance with his home medical regimen and dialysis sessions. Last dialysis session was on 06/28/2016. He denies any sick contacts. Patient is not tried anything for symptoms and states that they're getting worse. States that his home glucose readings are typically around 200.  Right heel laceration. Patient states that he ran his foot over the top of the nail causing the skin to repair open and bleed. Applied pressure with cessation of bleeding. No other therapies tried. Last tetanus shot less than 1 year ago.   ED Course: Objective findings outlined below. Patient started on glucose stabilizer at 50 mL per hour.  Review of Systems: As per HPI otherwise all other systems reviewed and are negative  Ambulatory Status: No restriction.  Past Medical History:  Diagnosis Date  . Anxiety   . Asthma   . 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 Depression   . DVT (deep venous thrombosis), H/o 01/2014-on Xarelto 03/24/2014   LLE  . ESRD (end stage renal disease) (HCC)   . Hypertension   . Hypothyroidism   . Nephrotic syndrome 05/18/2014  . Type 2 diabetes mellitus with diabetic nephropathy [facility])      Past Surgical History:  Procedure Laterality Date  . ANKLE FRACTURE SURGERY Right 1988  . AV FISTULA PLACEMENT Left 01/01/2015   Procedure: CREATION OF LEFT RADIAL CEPHALIC ARTERIOVENOUS (AV) FISTULA ;  Surgeon: Pryor Ochoa, MD;  Location: [facility] OR;  Service: Vascular;  Laterality: Left;  . CAPD REMOVAL N/A 05/19/2016   Procedure: PD CATH REMOVAL;  Surgeon: Abigail Miyamoto, MD;  Location: [facility] OR;  Service: General;  Laterality: N/A;  . CARDIAC CATHETERIZATION N/A 09/22/2015   Procedure: Left Heart Cath and Coronary Angiography;  Surgeon: Iran Ouch, MD;  Location: [facility] INVASIVE CV LAB;  Service: Cardiovascular;  Laterality: N/A;  . CORONARY ARTERY BYPASS GRAFT N/A 09/28/2015   Procedure: CORONARY ARTERY BYPASS GRAFTING (CABG) x 3 UTILIZING LEFT MAMMARY ARTERY AND ENDOSCOPICALLY HARVESTED LEFT GREATER SAPHENOUS VEIN.;  Surgeon: Delight Ovens, MD;  Location: [facility] OR;  Service: Open Heart Surgery;  Laterality: N/A;  . ENDOVEIN HARVEST OF GREATER SAPHENOUS VEIN Left 09/28/2015   Procedure: ENDOVEIN HARVEST OF GREATER SAPHENOUS VEIN;  Surgeon: Delight Ovens, MD;  Location: [facility] OR;  Service: Open Heart Surgery;  Laterality: Left;  . ESOPHAGOGASTRODUODENOSCOPY N/A 03/29/2014   Procedure: ESOPHAGOGASTRODUODENOSCOPY (EGD);  Surgeon: Dorena Cookey, MD;  Location: [facility];  Service: Endoscopy;  Laterality: N/A;  . EYE SURGERY    . FRACTURE SURGERY    . INSERTION OF DIALYSIS CATHETER Right 01/05/2015   Procedure: INSERTION OF RIGHT INTERNAL JUGULAR DIALYSIS CATHETER;  Surgeon: Fransisco Hertz, MD;  Location: [facility] OR;  Service: Vascular;  Laterality: Right;  . TEE WITHOUT CARDIOVERSION N/A 09/28/2015   Procedure: TRANSESOPHAGEAL  ECHOCARDIOGRAM (TEE);  Surgeon: Delight Ovens, MD;  Location: [facility] OR;  Service: Open Heart Surgery;  Laterality: N/A;  . TONSILLECTOMY AND ADENOIDECTOMY  1970s    Social History   Social History  . Marital status: Married    Spouse name: N/A  . Number of children: N/A   . Years of education: N/A   Occupational History  .      Maintenance Curator   Social History Main Topics  . Smoking status: Former Smoker    Types: Cigarettes    Quit date: 03/17/2016  . Smokeless tobacco: Never Used  . Alcohol use 0.0 oz/week  . Drug use: No  . Sexual activity: Not Currently   Other Topics Concern  . Not on file   Social History Narrative   Patient currently lives with his fiance   Works with facilities   Nonsmoker, nondrinker       Allergies  Allergen Reactions  . Ibuprofen Other (See Comments)    MD told patient not to take due to kidney disease  . Nsaids Other (See Comments)    Told to avoid all nsaids due to kidney disease   . Tape Other (See Comments)    Welts result, if left for a long amount of time    Family History  Problem Relation Age of Onset  . Obesity Mother        Patient states that family members have no other medical illnesses other than what I have described  . Kidney cancer Maternal Grandmother   . Cancer Father 50       AML  . Heart disease Unknown        No family history      Prior to Admission medications   Medication Sig Start Date End Date Taking? Authorizing Provider  acetaminophen (TYLENOL) 500 MG tablet Take 1 tablet (500 mg total) by mouth every 6 (six) hours as needed for moderate pain. Patient taking differently: Take 1,000 mg by mouth every 6 (six) hours as needed (for pain).  09/29/14  Yes Albertine Grates, MD  albuterol (PROVENTIL HFA;VENTOLIN HFA) 108 (90 Base) MCG/ACT inhaler Inhale 1 puff into the lungs every 6 (six) hours as needed for wheezing or shortness of breath. 03/11/15  Yes [name], MD  albuterol (PROVENTIL) (2.5 MG/3ML) 0.083% nebulizer solution Take 2.5 mg by nebulization every 6 (six) hours as needed for wheezing.   Yes [provider]  albuterol-ipratropium (COMBIVENT) 18-103 MCG/ACT inhaler Inhale 2 puffs into the lungs every 4 (four) hours. Patient taking differently: Inhale 2 puffs  into the lungs every 6 (six) hours as needed for wheezing or shortness of breath.  09/03/14  Yes [name], MD  amLODipine (NORVASC) 10 MG tablet Take 1 tablet (10 mg total) by mouth daily. 04/17/16  Yes Shade Flood, MD  aspirin EC 81 MG EC tablet Take 1 tablet (81 mg total) by mouth daily. 10/06/15  Yes Barrett, Erin R, PA-C  atorvastatin (LIPITOR) 80 MG tablet Take 1 tablet (80 mg total) by mouth daily at 6 PM. 04/17/16  Yes Shade Flood, MD  calcitRIOL (ROCALTROL) 0.25 MCG capsule Take 1 capsule (0.25 mcg total) by mouth every Monday, Wednesday, and Friday with hemodialysis. Patient taking differently: Take 0.25 mcg by mouth See admin instructions. Tuesday Thursday Saturday 10/06/15  Yes Barrett, Erin R, PA-C  calcium acetate (PHOSLO) 667 MG capsule Take 2 capsules (1,334 mg total) by mouth 3 (three) times daily with meals. 01/08/15  Yes Lynden Oxford  M, MD  clopidogrel (PLAVIX) 75 MG tablet Take 1 tablet (75 mg total) by mouth daily. 01/27/16  Yes Lewayne Bunting, MD  cyclobenzaprine (FLEXERIL) 10 MG tablet Take 1 tablet (10 mg total) by mouth at bedtime. 05/22/15  Yes [name], MD  gabapentin (NEURONTIN) 100 MG capsule Take 2 capsules (200 mg total) by mouth 3 (three) times daily. Patient taking differently: Take 800 mg by mouth 2 (two) times daily.  05/22/15  Yes [name], MD  HYDROmorphone (DILAUDID) 2 MG tablet Take 2 mg by mouth every 4 (four) hours as needed for severe pain.   Yes [provider]  hydrOXYzine (ATARAX/VISTARIL) 25 MG tablet Take 25 mg by mouth at bedtime.    Yes [provider]  insulin glargine (LANTUS) 100 UNIT/ML injection Inject 0.4 mLs (40 Units total) into the skin 2 (two) times daily. 05/20/16  Yes Dhungel, Nishant, MD  insulin lispro (HUMALOG) 100 UNIT/ML injection Inject 0.15-0.3 mLs (15-30 Units total) into the skin every evening. Take one unit per 5 carbs depending on dinner. 11/29/15  Yes Carlus Pavlov, MD   levothyroxine (SYNTHROID, LEVOTHROID) 50 MCG tablet Take 1 tablet (50 mcg total) by mouth daily. 05/22/15  Yes [name], MD  metoCLOPramide (REGLAN) 5 MG tablet Take 1 tablet (5 mg total) by mouth every 8 (eight) hours as needed for nausea or vomiting. Patient taking differently: Take 5 mg by mouth 2 (two) times daily.  01/10/16  Yes Dong, Robin, DO  metoprolol (LOPRESSOR) 50 MG tablet Take 50 mg by mouth 2 (two) times daily.   Yes [provider]  pantoprazole (PROTONIX) 40 MG tablet Take 1 tablet (40 mg total) by mouth daily. 05/25/16 06/29/16 Yes Rolly Salter, MD    Physical Exam: Vitals:   06/29/16 1015 06/29/16 1029 06/29/16 1030 06/29/16 1045  BP: (!) 183/106 (!) 184/104 (!) 184/104 (!) 181/104  Pulse: 98 (!) 101 100 97  Resp: (!) 21 17 18 19   Temp:      TempSrc:      SpO2: 94% 95% 95% 96%     General:  Ill-appearing, resting in bed.  Eyes:  PERRL, EOMI, normal lids, iris ENT:  grossly normal hearing, lips & tongue, mmm Neck:  no LAD, masses or thyromegaly Cardiovascular:  RRR, no m/r/g. No LE edema.  Respiratory:  CTA bilaterally, no w/r/r. Normal respiratory effort. Abdomen:  soft, ntnd, NABS Skin:  no rash or induration seen on limited exam, right heel with V-shaped tear along the medial aspect of his heel with several layers of callused skin pulled apart. Bleeding noted. No deep tissue, some epidermal structures noted. Musculoskeletal:  grossly normal tone BUE/BLE, good ROM, no bony abnormality Psychiatric:  grossly normal mood and affect, speech fluent and appropriate, AOx3 Neurologic:  CN 2-12 grossly intact, moves all extremities in coordinated fashion, sensation intact  Labs on Admission: I have personally reviewed following labs and imaging studies  CBC:  Recent Labs Lab 06/29/16 0208  WBC 10.7*  HGB 12.7*  HCT 39.0  MCV 86.9  PLT 294   Basic Metabolic Panel:  Recent Labs Lab 06/29/16 0208  NA 136  K 4.4  CL 93*  CO2 26  GLUCOSE  406*  BUN 27*  CREATININE 6.99*  CALCIUM 9.9   GFR: CrCl cannot be calculated (Unknown ideal weight.). Liver Function Tests:  Recent Labs Lab 06/29/16 0208  AST 23  ALT 16*  ALKPHOS 98  BILITOT 1.0  PROT 7.4  ALBUMIN 4.0  Recent Labs Lab 06/29/16 0208  LIPASE 28   No results for input(s): AMMONIA in the last 168 hours. Coagulation Profile: No results for input(s): INR, PROTIME in the last 168 hours. Cardiac Enzymes: No results for input(s): CKTOTAL, CKMB, CKMBINDEX, TROPONINI in the last 168 hours. BNP (last 3 results) No results for input(s): PROBNP in the last 8760 hours. HbA1C: No results for input(s): HGBA1C in the last 72 hours. CBG:  Recent Labs Lab 06/29/16 0705 06/29/16 0848 06/29/16 0953  GLUCAP 429* 418* 379*   Lipid Profile: No results for input(s): CHOL, HDL, LDLCALC, TRIG, CHOLHDL, LDLDIRECT in the last 72 hours. Thyroid Function Tests: No results for input(s): TSH, T4TOTAL, FREET4, T3FREE, THYROIDAB in the last 72 hours. Anemia Panel: No results for input(s): VITAMINB12, FOLATE, FERRITIN, TIBC, IRON, RETICCTPCT in the last 72 hours. Urine analysis:    Component Value Date/Time   COLORURINE YELLOW 05/18/2016 2110   APPEARANCEUR HAZY (A) 05/18/2016 2110   LABSPEC 1.018 05/18/2016 2110   PHURINE 7.0 05/18/2016 2110   GLUCOSEU >=500 (A) 05/18/2016 2110   HGBUR NEGATIVE 05/18/2016 2110   BILIRUBINUR NEGATIVE 05/18/2016 2110   BILIRUBINUR neg 05/26/2014 1956   KETONESUR NEGATIVE 05/18/2016 2110   PROTEINUR >=300 (A) 05/18/2016 2110   UROBILINOGEN 0.2 09/30/2014 1730   NITRITE NEGATIVE 05/18/2016 2110   LEUKOCYTESUR NEGATIVE 05/18/2016 2110    Creatinine Clearance: CrCl cannot be calculated (Unknown ideal weight.).  Sepsis Labs: @LABRCNTIP (procalcitonin:4,lacticidven:4) )No results found for this or any previous visit (from the past 240 hour(s)).   Radiological Exams on Admission: Ct Head Wo Contrast  Result Date:  06/29/2016 CLINICAL DATA:  46 y/o  M; headache and hypertension. EXAM: CT HEAD WITHOUT CONTRAST TECHNIQUE: Contiguous axial images were obtained from the base of the skull through the vertex without intravenous contrast. COMPARISON:  09/21/2014 CT head. FINDINGS: Brain: No evidence of acute infarction, hemorrhage, hydrocephalus, extra-axial collection or mass lesion/mass effect. Vascular: Calcific atherosclerosis of the carotid siphons. Skull: Normal. Negative for fracture or focal lesion. Sinuses/Orbits: Mild bilateral maxillary sinus mucosal thickening. Otherwise visualized paranasal sinuses and mastoid air cells are normally aerated. Orbits are unremarkable. Other: None. IMPRESSION: 1. No acute intracranial abnormality identified. 2. Mild maxillary sinus disease. 3. Otherwise unremarkable CT of head. Electronically Signed   By: Mitzi Hansen M.D.   On: 06/29/2016 06:19    EKG: Independently reviewed. Sinus. No ACS. Non-specific changes noted. Porlonged QT.  Assessment/Plan Active Problems:   Diabetic neuropathy (HCC)   Uncontrolled diabetes mellitus type 2 with peripheral artery disease (HCC)   Thyroid activity decreased   ESRD on dialysis (HCC)   DKA, type 1 (HCC)   Nausea vomiting and diarrhea   Laceration of foot   Hypertensive emergency    N/V/D: likely a combination of viral gastro and diabetic gastroparesis. Minimal improvement w/ phenergan and reglan. No ABD pain and no elevated WBC or  Fever - Increase home Reglan - PHenergan - Treatment of hyperglycemia as below.   ESRD: Pt dialyzes MWF. Last dialysis 06/28/16. - Nephrology aware and will dialyze as necessary  DM/Hyperglycemia: early DKA. Metabolic derangements likely due more so to GI loss and ESRD. AG 17 - Stop glucose stabilizer (started in ED for ~25yrs) - BMP Stat w/ Q12 after that. - continue lanuts - SSI  Skin tear: pt ran his foot over a nail. Last tetanus 1 year ago. After obtaining consent I used a 15  blade to remove the excess dead skin around the wound. Layers of deep dermal tissue  appreciated w/o acute bleeding or evidence of subcutaneous tissue. Bacitracin ointment and a sterile bandage was applied - daily ABX ointment and bandage change  Hypertensive emergency: BP as high as 224/125. Unable to keep down meds - continue norvasc, lopressor - Hydralazine IV PRN  GERD: - continue ppi  Chronic pain: - continue PO dilaudid, neurontin, flexeril - Morphine IV if unable to keep down PO meds and in severe pain.  Insomnia: - continue atarax  CAD: - continue ASA, Plavix, Statin - Tele - Trop x1  Hypothyroid: - continue synthroid   COPD: - continue combivent   DVT prophylaxis: hep  Code Status: full  Family Communication: none  Disposition Plan: pending improvement in n/v/d and possibly needing dialysis and resolution of hyperglycemia  Consults called: nephrology  Admission status: inpt    Arushi Partridge J MD [facility]  If 7PM-7AM, please contact night-coverage [email or web address] Password [email or web address]  06/29/2016, 11:32 AM

## 2016-06-29 NOTE — Consult Note (Signed)
[facility] Renal Consultation Note    Indication for Consultation:  Management of ESRD/hemodialysis; anemia, hypertension/volume and secondary hyperparathyroidism PCP:  HPI: Russell [name] is a 46 y.o. male with ESRD 2/2 DM/HTN on hemodialysis T,Th, S at [facility]. PMH significant for horse shoe kidney, CAD with CABG 09/2015, hypothyroidism, diabetic neuropathy, macular degeneration, morbid obesity, DVT, SHPT, AOCD. He was previously on home hemodialysis but recently changed back to HD in center.   Patient says he developed nausea, vomiting and diarrhea yesterday. "It has been non-stop" and continues vomiting during exam, greenish brown emesis. He denies any other symptoms, no fever, chills, chest pain, SOB, dysuria, hematuria. States abdomen is sore from vomiting, but no prior abdominal pain, tarry or bloody stool, flank pain, headaches, vision or hearing changes. He did step on nail and has laceration R heel. He is current with his tetanus shot.  Endorses compliance with HD. Last hemodialysis 06/28/16-ran full treatment, left within acceptable range of EDW.   Labs in ED basically unremarkable except for elevated BS 369.anion gap 17.  K+ 4.3 Scr 7.35 BUN 38. CT of head negative (Not clear why this was done). BP is elevated was 213/121 on arrival to ED. He was started on insulin drip in ED, has now been transitioned to S/S insulin per primary. PRN IV hydralazine has been ordered for HTN, BP still high 170/89 but improved. Nothing seems to have improved the nausea. Phenergan and reglan has been given.   Past Medical History:  Diagnosis Date  . Anxiety   . Asthma   . 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 Depression   . DVT (deep venous thrombosis), H/o 01/2014-on Xarelto 03/24/2014   LLE  . ESRD (end stage renal disease) (HCC)    . Hypertension   . Hypothyroidism   . Nephrotic syndrome 05/18/2014  . Type 2 diabetes mellitus with diabetic nephropathy [facility])    Past Surgical History:  Procedure Laterality Date  . ANKLE FRACTURE SURGERY Right 1988  . AV FISTULA PLACEMENT Left 01/01/2015   Procedure: CREATION OF LEFT RADIAL CEPHALIC ARTERIOVENOUS (AV) FISTULA ;  Surgeon: Pryor Ochoa, MD;  Location: [facility] OR;  Service: Vascular;  Laterality: Left;  . CAPD REMOVAL N/A 05/19/2016   Procedure: PD CATH REMOVAL;  Surgeon: Abigail Miyamoto, MD;  Location: [facility] OR;  Service: General;  Laterality: N/A;  . CARDIAC CATHETERIZATION N/A 09/22/2015   Procedure: Left Heart Cath and Coronary Angiography;  Surgeon: Iran Ouch, MD;  Location: [facility] INVASIVE CV LAB;  Service: Cardiovascular;  Laterality: N/A;  . CORONARY ARTERY BYPASS GRAFT N/A 09/28/2015   Procedure: CORONARY ARTERY BYPASS GRAFTING (CABG) x 3 UTILIZING LEFT MAMMARY ARTERY AND ENDOSCOPICALLY HARVESTED LEFT GREATER SAPHENOUS VEIN.;  Surgeon: Delight Ovens, MD;  Location: [facility] OR;  Service: Open Heart Surgery;  Laterality: N/A;  . ENDOVEIN HARVEST OF GREATER SAPHENOUS VEIN Left 09/28/2015   Procedure: ENDOVEIN HARVEST OF GREATER SAPHENOUS VEIN;  Surgeon: Delight Ovens, MD;  Location[facility] OR;  Service: Open Heart Surgery;  Laterality: Left;  . ESOPHAGOGASTRODUODENOSCOPY N/A 03/29/2014   Procedure: ESOPHAGOGASTRODUODENOSCOPY (EGD);  Surgeon: Dorena Cookey, MD;  Location: [facility] ENDOSCOPY;  Service: Endoscopy;  Laterality: N/A;  . EYE SURGERY    . FRACTURE SURGERY    . INSERTION OF DIALYSIS CATHETER Right 01/05/2015   Procedure: INSERTION OF RIGHT INTERNAL JUGULAR DIALYSIS  CATHETER;  Surgeon: Fransisco Hertz, MD;  Location: [facility] OR;  Service: Vascular;  Laterality: Right;  . TEE WITHOUT CARDIOVERSION N/A 09/28/2015   Procedure: TRANSESOPHAGEAL ECHOCARDIOGRAM (TEE);  Surgeon: Delight Ovens, MD;  Location: [facility] OR;  Service: Open Heart Surgery;  Laterality: N/A;  . TONSILLECTOMY AND  ADENOIDECTOMY  1970s   Family History  Problem Relation Age of Onset  . Obesity Mother        Patient states that family members have no other medical illnesses other than what I have described  . Kidney cancer Maternal Grandmother   . Cancer Father 50       AML  . Heart disease Unknown        No family history   Social History:  reports that he quit smoking about 3 months ago. His smoking use included Cigarettes. He has never used smokeless tobacco. He reports that he drinks alcohol. He reports that he does not use drugs. Allergies  Allergen Reactions  . Ibuprofen Other (See Comments)    MD told patient not to take due to kidney disease  . Nsaids Other (See Comments)    Told to avoid all nsaids due to kidney disease   . Tape Other (See Comments)    Welts result, if left for a long amount of time   Prior to Admission medications   Medication Sig Start Date End Date Taking? Authorizing Provider  acetaminophen (TYLENOL) 500 MG tablet Take 1 tablet (500 mg total) by mouth every 6 (six) hours as needed for moderate pain. Patient taking differently: Take 1,000 mg by mouth every 6 (six) hours as needed (for pain).  09/29/14  Yes Albertine Grates, MD  albuterol (PROVENTIL HFA;VENTOLIN HFA) 108 (90 Base) MCG/ACT inhaler Inhale 1 puff into the lungs every 6 (six) hours as needed for wheezing or shortness of breath. 03/11/15  Yes [name], MD  albuterol (PROVENTIL) (2.5 MG/3ML) 0.083% nebulizer solution Take 2.5 mg by nebulization every 6 (six) hours as needed for wheezing.   Yes [provider]  albuterol-ipratropium (COMBIVENT) 18-103 MCG/ACT inhaler Inhale 2 puffs into the lungs every 4 (four) hours. Patient taking differently: Inhale 2 puffs into the lungs every 6 (six) hours as needed for wheezing or shortness of breath.  09/03/14  Yes [name], MD  amLODipine (NORVASC) 10 MG tablet Take 1 tablet (10 mg total) by mouth daily. 04/17/16  Yes Shade Flood, MD  aspirin EC 81 MG  EC tablet Take 1 tablet (81 mg total) by mouth daily. 10/06/15  Yes Barrett, Erin R, PA-C  atorvastatin (LIPITOR) 80 MG tablet Take 1 tablet (80 mg total) by mouth daily at 6 PM. 04/17/16  Yes Shade Flood, MD  calcitRIOL (ROCALTROL) 0.25 MCG capsule Take 1 capsule (0.25 mcg total) by mouth every Monday, Wednesday, and Friday with hemodialysis. Patient taking differently: Take 0.25 mcg by mouth See admin instructions. Tuesday Thursday Saturday 10/06/15  Yes Barrett, Erin R, PA-C  calcium acetate (PHOSLO) 667 MG capsule Take 2 capsules (1,334 mg total) by mouth 3 (three) times daily with meals. 01/08/15  Yes Rolly Salter, MD  clopidogrel (PLAVIX) 75 MG tablet Take 1 tablet (75 mg total) by mouth daily. 01/27/16  Yes Lewayne Bunting, MD  cyclobenzaprine (FLEXERIL) 10 MG tablet Take 1 tablet (10 mg total) by mouth at bedtime. 05/22/15  Yes [name], MD  gabapentin (NEURONTIN) 100 MG capsule Take 2 capsules (200 mg total) by mouth 3 (three) times daily. Patient taking  differently: Take 800 mg by mouth 2 (two) times daily.  05/22/15  Yes [name], MD  HYDROmorphone (DILAUDID) 2 MG tablet Take 2 mg by mouth every 4 (four) hours as needed for severe pain.   Yes [provider]  hydrOXYzine (ATARAX/VISTARIL) 25 MG tablet Take 25 mg by mouth at bedtime.    Yes [provider]  insulin glargine (LANTUS) 100 UNIT/ML injection Inject 0.4 mLs (40 Units total) into the skin 2 (two) times daily. 05/20/16  Yes Dhungel, Nishant, MD  insulin lispro (HUMALOG) 100 UNIT/ML injection Inject 0.15-0.3 mLs (15-30 Units total) into the skin every evening. Take one unit per 5 carbs depending on dinner. 11/29/15  Yes Carlus Pavlov, MD  levothyroxine (SYNTHROID, LEVOTHROID) 50 MCG tablet Take 1 tablet (50 mcg total) by mouth daily. 05/22/15  Yes [name], MD  metoCLOPramide (REGLAN) 5 MG tablet Take 1 tablet (5 mg total) by mouth every 8 (eight) hours as needed for nausea or  vomiting. Patient taking differently: Take 5 mg by mouth 2 (two) times daily.  01/10/16  Yes Dong, Robin, DO  metoprolol (LOPRESSOR) 50 MG tablet Take 50 mg by mouth 2 (two) times daily.   Yes [provider]  pantoprazole (PROTONIX) 40 MG tablet Take 1 tablet (40 mg total) by mouth daily. 05/25/16 06/29/16 Yes Rolly Salter, MD   Current Facility-Administered Medications  Medication Dose Route Frequency Provider Last Rate Last Dose  . 0.9 %  sodium chloride infusion  250 mL Intravenous PRN Ozella Rocks, MD      . acetaminophen (TYLENOL) tablet 650 mg  650 mg Oral Q6H PRN Ozella Rocks, MD       Or  . acetaminophen (TYLENOL) suppository 650 mg  650 mg Rectal Q6H PRN Ozella Rocks, MD      . albuterol (PROVENTIL) (2.5 MG/3ML) 0.083% nebulizer solution 2.5 mg  2.5 mg Inhalation Q6H PRN Ozella Rocks, MD      . amLODipine (NORVASC) tablet 10 mg  10 mg Oral Daily Ozella Rocks, MD   10 mg at 06/29/16 1311  . aspirin EC tablet 81 mg  81 mg Oral Daily Ozella Rocks, MD   81 mg at 06/29/16 1310  . atorvastatin (LIPITOR) tablet 80 mg  80 mg Oral q1800 Ozella Rocks, MD      . calcitRIOL (ROCALTROL) capsule 0.25 mcg  0.25 mcg Oral Q T,Th,Sat-1800 Ozella Rocks, MD      . calcium acetate (PHOSLO) capsule 1,334 mg  1,334 mg Oral TID WC Ozella Rocks, MD   Stopped at 06/29/16 1315  . clopidogrel (PLAVIX) tablet 75 mg  75 mg Oral Daily Ozella Rocks, MD   75 mg at 06/29/16 1311  . cyclobenzaprine (FLEXERIL) tablet 10 mg  10 mg Oral QHS Ozella Rocks, MD      . gabapentin (NEURONTIN) capsule 800 mg  800 mg Oral BID Ozella Rocks, MD   800 mg at 06/29/16 1309  . heparin injection 5,000 Units  5,000 Units Subcutaneous Q8H Ozella Rocks, MD      . hydrALAZINE (APRESOLINE) injection 5-10 mg  5-10 mg Intravenous Q4H PRN Ozella Rocks, MD   10 mg at 06/29/16 1058  . HYDROmorphone (DILAUDID) tablet 2 mg  2 mg Oral Q4H PRN Ozella Rocks, MD      . hydrOXYzine  (ATARAX/VISTARIL) tablet 25 mg  25 mg Oral QHS Ozella Rocks, MD      .  insulin aspart (novoLOG) injection 0-15 Units  0-15 Units Subcutaneous TID WC Ozella Rocks, MD   11 Units at 06/29/16 1309  . insulin aspart (novoLOG) injection 0-5 Units  0-5 Units Subcutaneous QHS Ozella Rocks, MD      . insulin glargine (LANTUS) injection 40 Units  40 Units Subcutaneous BID Ozella Rocks, MD      . levothyroxine (SYNTHROID, LEVOTHROID) tablet 50 mcg  50 mcg Oral QAC breakfast Ozella Rocks, MD      . metoCLOPramide (REGLAN) injection 10 mg  10 mg Intravenous Q8H Ozella Rocks, MD      . metoprolol tartrate (LOPRESSOR) tablet 50 mg  50 mg Oral BID Ozella Rocks, MD   50 mg at 06/29/16 1310  . morphine 4 MG/ML injection 4 mg  4 mg Intravenous Q4H PRN Ozella Rocks, MD      . pantoprazole (PROTONIX) EC tablet 40 mg  40 mg Oral Daily Ozella Rocks, MD   40 mg at 06/29/16 1312  . promethazine (PHENERGAN) injection 25 mg  25 mg Intravenous Q8H PRN Ozella Rocks, MD      . sodium chloride flush (NS) 0.9 % injection 3 mL  3 mL Intravenous Q12H Ozella Rocks, MD   3 mL at 06/29/16 1313  . sodium chloride flush (NS) 0.9 % injection 3 mL  3 mL Intravenous PRN Ozella Rocks, MD       Current Outpatient Prescriptions  Medication Sig Dispense Refill  . acetaminophen (TYLENOL) 500 MG tablet Take 1 tablet (500 mg total) by mouth every 6 (six) hours as needed for moderate pain. (Patient taking differently: Take 1,000 mg by mouth every 6 (six) hours as needed (for pain). ) 30 tablet 0  . albuterol (PROVENTIL HFA;VENTOLIN HFA) 108 (90 Base) MCG/ACT inhaler Inhale 1 puff into the lungs every 6 (six) hours as needed for wheezing or shortness of breath. 1 Inhaler 11  . albuterol (PROVENTIL) (2.5 MG/3ML) 0.083% nebulizer solution Take 2.5 mg by nebulization every 6 (six) hours as needed for wheezing.    Marland Kitchen albuterol-ipratropium (COMBIVENT) 18-103 MCG/ACT inhaler Inhale 2 puffs into the lungs  every 4 (four) hours. (Patient taking differently: Inhale 2 puffs into the lungs every 6 (six) hours as needed for wheezing or shortness of breath. ) 1 Inhaler 11  . amLODipine (NORVASC) 10 MG tablet Take 1 tablet (10 mg total) by mouth daily. 90 tablet 1  . aspirin EC 81 MG EC tablet Take 1 tablet (81 mg total) by mouth daily.    Marland Kitchen atorvastatin (LIPITOR) 80 MG tablet Take 1 tablet (80 mg total) by mouth daily at 6 PM. 90 tablet 1  . calcitRIOL (ROCALTROL) 0.25 MCG capsule Take 1 capsule (0.25 mcg total) by mouth every Monday, Wednesday, and Friday with hemodialysis. (Patient taking differently: Take 0.25 mcg by mouth See admin instructions. Tuesday Thursday Saturday) 30 capsule 3  . calcium acetate (PHOSLO) 667 MG capsule Take 2 capsules (1,334 mg total) by mouth 3 (three) times daily with meals. 180 capsule 0  . clopidogrel (PLAVIX) 75 MG tablet Take 1 tablet (75 mg total) by mouth daily. 90 tablet 3  . cyclobenzaprine (FLEXERIL) 10 MG tablet Take 1 tablet (10 mg total) by mouth at bedtime. 30 tablet 11  . gabapentin (NEURONTIN) 100 MG capsule Take 2 capsules (200 mg total) by mouth 3 (three) times daily. (Patient taking differently: Take 800 mg by mouth 2 (two) times daily. ) 180 capsule 11  .  HYDROmorphone (DILAUDID) 2 MG tablet Take 2 mg by mouth every 4 (four) hours as needed for severe pain.    . hydrOXYzine (ATARAX/VISTARIL) 25 MG tablet Take 25 mg by mouth at bedtime.     . insulin glargine (LANTUS) 100 UNIT/ML injection Inject 0.4 mLs (40 Units total) into the skin 2 (two) times daily. 10 mL 0  . insulin lispro (HUMALOG) 100 UNIT/ML injection Inject 0.15-0.3 mLs (15-30 Units total) into the skin every evening. Take one unit per 5 carbs depending on dinner. 10 mL 2  . levothyroxine (SYNTHROID, LEVOTHROID) 50 MCG tablet Take 1 tablet (50 mcg total) by mouth daily. 90 tablet 3  . metoCLOPramide (REGLAN) 5 MG tablet Take 1 tablet (5 mg total) by mouth every 8 (eight) hours as needed for nausea or  vomiting. (Patient taking differently: Take 5 mg by mouth 2 (two) times daily. ) 30 tablet 0  . metoprolol (LOPRESSOR) 50 MG tablet Take 50 mg by mouth 2 (two) times daily.    . pantoprazole (PROTONIX) 40 MG tablet Take 1 tablet (40 mg total) by mouth daily. 14 tablet 0   Labs: Basic Metabolic Panel:  Recent Labs Lab 06/29/16 0208 06/29/16 1055  NA 136 138  K 4.4 4.3  CL 93* 98*  CO2 26 25  GLUCOSE 406* 369*  BUN 27* 38*  CREATININE 6.99* 7.35*  CALCIUM 9.9 9.4  PHOS  --  5.4*   Liver Function Tests:  Recent Labs Lab 06/29/16 0208  AST 23  ALT 16*  ALKPHOS 98  BILITOT 1.0  PROT 7.4  ALBUMIN 4.0    Recent Labs Lab 06/29/16 0208  LIPASE 28   No results for input(s): AMMONIA in the last 168 hours. CBC:  Recent Labs Lab 06/29/16 0208  WBC 10.7*  HGB 12.7*  HCT 39.0  MCV 86.9  PLT 294   Cardiac Enzymes:  Recent Labs Lab 06/29/16 1137  TROPONINI 0.04*   CBG:  Recent Labs Lab 06/29/16 0705 06/29/16 0848 06/29/16 0953 06/29/16 1154  GLUCAP 429* 418* 379* 328*   Iron Studies: No results for input(s): IRON, TIBC, TRANSFERRIN, FERRITIN in the last 72 hours. Studies/Results: Ct Head Wo Contrast  Result Date: 06/29/2016 CLINICAL DATA:  46 y/o  M; headache and hypertension. EXAM: CT HEAD WITHOUT CONTRAST TECHNIQUE: Contiguous axial images were obtained from the base of the skull through the vertex without intravenous contrast. COMPARISON:  09/21/2014 CT head. FINDINGS: Brain: No evidence of acute infarction, hemorrhage, hydrocephalus, extra-axial collection or mass lesion/mass effect. Vascular: Calcific atherosclerosis of the carotid siphons. Skull: Normal. Negative for fracture or focal lesion. Sinuses/Orbits: Mild bilateral maxillary sinus mucosal thickening. Otherwise visualized paranasal sinuses and mastoid air cells are normally aerated. Orbits are unremarkable. Other: None. IMPRESSION: 1. No acute intracranial abnormality identified. 2. Mild maxillary  sinus disease. 3. Otherwise unremarkable CT of head. Electronically Signed   By: Mitzi HansenLance  Furusawa-Stratton M.D.   On: 06/29/2016 06:19    ROS: As per HPI otherwise negative.   Physical Exam: Vitals:   06/29/16 1200 06/29/16 1230 06/29/16 1245 06/29/16 1300  BP: (!) 189/102 (!) 175/94 (!) 187/93 (!) 170/89  Pulse: (!) 105 (!) 107 (!) 105 (!) 105  Resp: 20 15 (!) 21 (!) 21  Temp:      TempSrc:      SpO2: 93% 93% 92% 93%     General: Well developed, well nourished, mild distress D/T vomiting.  Head: Normocephalic, atraumatic, sclera non-icteric, mucus membranes are moist Neck: Supple. JVD not elevated.  Lungs: Clear bilaterally to auscultation without wheezes, rales, or rhonchi. Breathing is unlabored. Heart: RRR with S1 S2. No murmurs, rubs, or gallops appreciated. Abdomen: Soft, non-tender, non-distended with normoactive bowel sounds. No rebound/guarding. No obvious abdominal masses. M-S:  Strength and tone appear normal for age. Lower extremities: No LE edema. Laceration R heel covered with band-aide.  Neuro: Alert and oriented X 3. Moves all extremities spontaneously. Psych:  Responds to questions appropriately with a normal affect. Dialysis Access: LFA AVF + bruit  Dialysis Orders:  T,Th,S 4 hours 180 NRe 400/800 119 kg 2.0 K/2.5 Ca UF Profile 4 LFA AVF placed 01/01/15 per Dr. Hart [place], -Heparin 8000 units IV initial bolus 2000 units IV mid run TIW -Mircera 200 mcg IV q 2 weeks (last dose 06/28/16 Last HGB 10.4 06/22/16) -Venofer 50 mg IV weekly (last dose 06/28/16 Last Fe 44 Tsat 21% 06/08/16-rec'd Fe load) -Calcitriol 0.25 mcg PO TIW (Last PTH 574 06/18/16)   Assessment/Plan: 1.  N, V,D: Per primary. Possible viral syndrome vs DM gastroparesis.  2.  Hyperglycemia: Mild DKA, was on insulin drip now transitioned to S/S insulin. Anion gap now 17.  3.  ESRD -  T,Th,S but had HD yesterday off schedule. No evidence of volume overload-next HD tomorrow then short tx  Saturday to get back on schedule. K+ 4.3.  4.  Hypertension/volume  - Hypertensive emergency on arrival to ED D/T inability to keep down meds. Resume home meds when able to take PO meds. No wt available on pt-will order HD to OP EDW of 119 kg and adjust as needed.  5.  Anemia  - HGB 12.7 ESA dose yesterday. Follow HGB 6.  Metabolic bone disease -  Continue binders, VDRA,  7.  Nutrition - Clear liquids at present. Renal/Carb mod/renal vit/nepro when able to eat.  8.  DM-per primary.   Rita H. Manson Passey, NP-C 06/29/2016, 2:08 PM  [facility] Beeper [phone]  Pt seen, examined and agree w A/P as above. ESRD diabetic with acute vomiting/ diarrhea, prob viral syndrome.  At dry wt and no vol excess on exam.  Plan HD tomorrow w/o UF.   [name] MD [employer] pager [phone]   06/29/2016, 3:55 PM

## 2016-06-29 NOTE — ED Provider Notes (Addendum)
Patient reports he has end-stage renal disease and he gets dialysis on Monday, Wednesday (June 27) and Fridays. He states he had no problems during dialysis yesterday, 27th. However later on he started having vomiting. He states he has lots of nausea but no abdominal pain although at one point he states he did have some mild epigastric discomfort. He states that normally she he has felt this way before when he had DKA. He states Reglan and Phenergan usually make his nausea better. He has had both in the ED however he reports he still very nauseated and starts to have some dry heaves during my exam.  Patient is laying on his side looks like he feels bad. Patient is pale. His mucous membranes are dry. On abdominal exam he has mild epigastric tenderness.   We discussed his laboratory results which shows that he is in early DKA. Patient was started on a insulin drip. We will have the hospitalist admit.  05:55 AM Dr Julian ReilGardner, hospitalist, will admit   Medical screening examination/treatment/procedure(s) were conducted as a shared visit with non-physician practitioner(s) and myself.  I personally evaluated the patient during the encounter.   EKG Interpretation  Date/Time:  Thursday June 29 2016 01:13:38 EDT Ventricular Rate:  85 PR Interval:  180 QRS Duration: 90 QT Interval:  416 QTC Calculation: 495 R Axis:   1 Text Interpretation:  Normal sinus rhythm Left ventricular hypertrophy with repolarization abnormality Prolonged QT No significant change since last tracing 25 May 2016 Confirmed by Devoria AlbeKnapp, Arletha Marschke ([id number]) on 06/29/2016 1:30:05 AM       EKG Interpretation  Date/Time:  Thursday June 29 2016 05:01:03 EDT Ventricular Rate:  102 PR Interval:  180 QRS Duration: 95 QT Interval:  384 QTC Calculation: 501 R Axis:   36 Text Interpretation:  Sinus tachycardia Anterior infarct, old Nonspecific T abnormalities, lateral leads Prolonged QT interval Confirmed by Devoria AlbeKnapp, Tatsuya Okray ([id number]) on 06/29/2016 5:16:40  AM       Diagnoses that have been ruled out:  None  Diagnoses that are still under consideration:  None  Final diagnoses:  Intractable vomiting with nausea, unspecified vomiting type  Dehydration  Diabetic ketoacidosis without coma associated with type 2 diabetes mellitus [facility](HCC)  Essential hypertension    Plan admission   Devoria AlbeIva Island Dohmen, MD, Concha PyoFACEP     Uriel Horkey, MD 06/29/16 [id number]    Devoria AlbeKnapp, Dayon Witt, MD 06/29/16 [phone]

## 2016-06-29 NOTE — ED Notes (Signed)
Patient transported to CT 

## 2016-06-29 NOTE — ED Notes (Signed)
CBG 361 

## 2016-06-29 NOTE — ED Notes (Signed)
Pt did not need anything at this time  

## 2016-06-29 NOTE — ED Provider Notes (Signed)
[facility]-EMERGENCY DEPT Provider Note   CSN: [id number] Arrival date & time: 06/29/16  0057    History   Chief Complaint Chief Complaint  Patient presents with  . Weakness  . Emesis    HPI Russell [name] is a 46 y.o. male.  46 year old male with a history of hypertension, DVT (on chronic Xarelto), hypothyroid, asthma, anxiety, diabetes mellitus, CAD status post CABG 3, and nephrotic syndrome on M/W/F dialysis presents to the emergency department for vomiting and diarrhea. Patient notes onset of nausea at 1415 today. He states that symptoms began shortly after completion of his 4 hour dialysis session. Patient notes vomiting every 15-20 minutes. He believes he has had approximately 15-20 total episodes of emesis since symptom onset. He has received Zofran for nausea without significant improvement. He has also had copious episodes of watery diarrhea, usually every time he vomits. Patient has had hot and cold chills. He denies any known fever. No sick contacts. He further denies chest pain, shortness of breath, and abdominal pain. He denies known melena or hematochezia. No hx of abdominal surgeries.      Past Medical History:  Diagnosis Date  . Anxiety   . Asthma   . 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 Depression   . DVT (deep venous thrombosis), H/o 01/2014-on Xarelto 03/24/2014   LLE  . ESRD (end stage renal disease) (HCC)   . Hypertension   . Hypothyroidism   . Nephrotic syndrome 05/18/2014  . Type 2 diabetes mellitus with diabetic nephropathy [facility](HCC)     Patient Active Problem List   Diagnosis Date Noted  . Acute on chronic respiratory failure with hypoxia (HCC) 05/16/2016  . Fluid overload 05/16/2016  . Neutropenia with fever (HCC) 05/16/2016  . Anemia due to end stage renal disease (HCC) 05/16/2016  . Abdominal pain 03/09/2016  .  S/P CABG (coronary artery bypass graft) 09/28/2015  . ESRD on dialysis (HCC) 02/13/2015  . Nephrotic syndrome 01/03/2015  . Essential hypertension, benign   . Thyroid activity decreased   . Hx of gastroesophageal reflux (GERD) 09/30/2014  . Acute on chronic renal failure (HCC) 09/24/2014  . Diabetic neuropathy (HCC) 09/21/2014  . Uncontrolled diabetes mellitus type 2 with peripheral artery disease (HCC) 09/21/2014  . Morbid obesity (HCC) 09/21/2014  . Chest pain 04/22/2014  . Superficial thrombophlebitis 03/24/2014    Past Surgical History:  Procedure Laterality Date  . ANKLE FRACTURE SURGERY Right 1988  . AV FISTULA PLACEMENT Left 01/01/2015   Procedure: CREATION OF LEFT RADIAL CEPHALIC ARTERIOVENOUS (AV) FISTULA ;  Surgeon: Pryor OchoaJames D Lawson, MD;  [facility];  Service: Vascular;  Laterality: Left;  . CAPD REMOVAL N/A 05/19/2016   Procedure: PD CATH REMOVAL;  Surgeon: Abigail MiyamotoBlackman, Douglas, MD;  [facility];  Service: General;  Laterality: N/A;  . CARDIAC CATHETERIZATION N/A 09/22/2015   Procedure: Left Heart Cath and Coronary Angiography;  Surgeon: Iran OuchMuhammad A Arida, MD;  Location: [facility] INVASIVE CV LAB;  Service: Cardiovascular;  Laterality: N/A;  . CORONARY ARTERY BYPASS GRAFT N/A 09/28/2015   Procedure: CORONARY ARTERY BYPASS GRAFTING (CABG) x 3 UTILIZING LEFT MAMMARY ARTERY AND ENDOSCOPICALLY HARVESTED LEFT GREATER SAPHENOUS VEIN.;  Surgeon: Delight OvensEdward B Gerhardt, MD;  Location: [facility] OR;  Service: Open Heart Surgery;  Laterality: N/A;  . ENDOVEIN HARVEST OF GREATER SAPHENOUS VEIN Left 09/28/2015   Procedure: ENDOVEIN HARVEST OF GREATER  SAPHENOUS VEIN;  Surgeon: Delight Ovens, MD;  Location: [facility] OR;  Service: Open Heart Surgery;  Laterality: Left;  . ESOPHAGOGASTRODUODENOSCOPY N/A 03/29/2014   Procedure: ESOPHAGOGASTRODUODENOSCOPY (EGD);  Surgeon: Dorena Cookey, MD;  Location: [facility];  Service: Endoscopy;  Laterality: N/A;  . EYE SURGERY    . FRACTURE SURGERY    . INSERTION OF DIALYSIS  CATHETER Right 01/05/2015   Procedure: INSERTION OF RIGHT INTERNAL JUGULAR DIALYSIS CATHETER;  Surgeon: Fransisco Hertz, MD;  Location: [facility] OR;  Service: Vascular;  Laterality: Right;  . TEE WITHOUT CARDIOVERSION N/A 09/28/2015   Procedure: TRANSESOPHAGEAL ECHOCARDIOGRAM (TEE);  Surgeon: Delight Ovens, MD;  Location: [facility] OR;  Service: Open Heart Surgery;  Laterality: N/A;  . TONSILLECTOMY AND ADENOIDECTOMY  1970s       Home Medications    Prior to Admission medications   Medication Sig Start Date End Date Taking? Authorizing Provider  acetaminophen (TYLENOL) 500 MG tablet Take 1 tablet (500 mg total) by mouth every 6 (six) hours as needed for moderate pain. Patient taking differently: Take 1,000 mg by mouth every 6 (six) hours as needed (for pain).  09/29/14  Yes Albertine Grates, MD  albuterol (PROVENTIL HFA;VENTOLIN HFA) 108 (90 Base) MCG/ACT inhaler Inhale 1 puff into the lungs every 6 (six) hours as needed for wheezing or shortness of breath. 03/11/15  Yes [name], MD  albuterol (PROVENTIL) (2.5 MG/3ML) 0.083% nebulizer solution Take 2.5 mg by nebulization every 6 (six) hours as needed for wheezing.   Yes [provider]  albuterol-ipratropium (COMBIVENT) 18-103 MCG/ACT inhaler Inhale 2 puffs into the lungs every 4 (four) hours. Patient taking differently: Inhale 2 puffs into the lungs every 6 (six) hours as needed for wheezing or shortness of breath.  09/03/14  Yes [name], MD  amLODipine (NORVASC) 10 MG tablet Take 1 tablet (10 mg total) by mouth daily. 04/17/16  Yes Shade Flood, MD  aspirin EC 81 MG EC tablet Take 1 tablet (81 mg total) by mouth daily. 10/06/15  Yes Barrett, Erin R, PA-C  atorvastatin (LIPITOR) 80 MG tablet Take 1 tablet (80 mg total) by mouth daily at 6 PM. 04/17/16  Yes Shade Flood, MD  calcitRIOL (ROCALTROL) 0.25 MCG capsule Take 1 capsule (0.25 mcg total) by mouth every Monday, Wednesday, and Friday with hemodialysis. Patient taking differently:  Take 0.25 mcg by mouth See admin instructions. Tuesday Thursday Saturday 10/06/15  Yes Barrett, Erin R, PA-C  calcium acetate (PHOSLO) 667 MG capsule Take 2 capsules (1,334 mg total) by mouth 3 (three) times daily with meals. 01/08/15  Yes Rolly Salter, MD  clopidogrel (PLAVIX) 75 MG tablet Take 1 tablet (75 mg total) by mouth daily. 01/27/16  Yes Lewayne Bunting, MD  cyclobenzaprine (FLEXERIL) 10 MG tablet Take 1 tablet (10 mg total) by mouth at bedtime. 05/22/15  Yes [name], MD  gabapentin (NEURONTIN) 100 MG capsule Take 2 capsules (200 mg total) by mouth 3 (three) times daily. Patient taking differently: Take 800 mg by mouth 2 (two) times daily.  05/22/15  Yes [name], MD  HYDROmorphone (DILAUDID) 2 MG tablet Take 2 mg by mouth every 4 (four) hours as needed for severe pain.   Yes [provider]  hydrOXYzine (ATARAX/VISTARIL) 25 MG tablet Take 25 mg by mouth at bedtime.    Yes [provider]  insulin glargine (LANTUS) 100 UNIT/ML injection Inject 0.4 mLs (40 Units total) into the skin 2 (two) times daily. 05/20/16  Yes Dhungel, Nishant,  MD  insulin lispro (HUMALOG) 100 UNIT/ML injection Inject 0.15-0.3 mLs (15-30 Units total) into the skin every evening. Take one unit per 5 carbs depending on dinner. 11/29/15  Yes Carlus Pavlov, MD  levothyroxine (SYNTHROID, LEVOTHROID) 50 MCG tablet Take 1 tablet (50 mcg total) by mouth daily. 05/22/15  Yes [name], MD  metoCLOPramide (REGLAN) 5 MG tablet Take 1 tablet (5 mg total) by mouth every 8 (eight) hours as needed for nausea or vomiting. Patient taking differently: Take 5 mg by mouth 2 (two) times daily.  01/10/16  Yes Dong, Robin, DO  metoprolol (LOPRESSOR) 50 MG tablet Take 50 mg by mouth 2 (two) times daily.   Yes [provider]  pantoprazole (PROTONIX) 40 MG tablet Take 1 tablet (40 mg total) by mouth daily. 05/25/16 06/29/16 Yes Rolly Salter, MD    Family History Family History  Problem  Relation Age of Onset  . Obesity Mother        Patient states that family members have no other medical illnesses other than what I have described  . Kidney cancer Maternal Grandmother   . Cancer Father 50       AML  . Heart disease Unknown        No family history    Social History Social History  Substance Use Topics  . Smoking status: Former Smoker    Types: Cigarettes    Quit date: 03/17/2016  . Smokeless tobacco: Never Used  . Alcohol use 0.0 oz/week     Allergies   Ibuprofen; Nsaids; and Tape   Review of Systems Review of Systems Ten systems reviewed and are negative for acute change, except as noted in the HPI.    Physical Exam Updated Vital Signs BP (!) 181/94   Pulse (!) 107   Temp 97.6 F (36.4 C) (Oral)   Resp (!) 22   SpO2 99%   Physical Exam  Constitutional: He is oriented to person, place, and time. He appears well-developed and well-nourished. No distress.  Mild diaphoresis. Looks unwell, but nontoxic.  HENT:  Head: Normocephalic and atraumatic.  Eyes: Conjunctivae and EOM are normal. No scleral icterus.  Neck: Normal range of motion.  Cardiovascular: Regular rhythm and intact distal pulses.   Tachycardia  Pulmonary/Chest: Effort normal. No respiratory distress. He has no wheezes.  Chest expansion symmetric. Mild dyspnea without tachypnea.  Abdominal: Soft. He exhibits distension.  Soft, distended abdomen. Normoactive bowel sounds. No rigidity. No focal TTP.  Musculoskeletal: Normal range of motion.  Palpable thrill to L forearm fistula.  Neurological: He is alert and oriented to person, place, and time. He exhibits normal muscle tone. Coordination normal.  GCS 15. Patient moving all extremities.  Skin: Skin is warm. No rash noted. He is diaphoretic. No erythema. No pallor.  Psychiatric: He has a normal mood and affect. His behavior is normal.  Nursing note and vitals reviewed.    ED Treatments / Results  Labs (all labs ordered are listed,  but only abnormal results are displayed) Labs Reviewed  CBC - Abnormal; Notable for the following:       Result Value   WBC 10.7 (*)    Hemoglobin 12.7 (*)    All other components within normal limits  COMPREHENSIVE METABOLIC PANEL - Abnormal; Notable for the following:    Chloride 93 (*)    Glucose, Bld 406 (*)    BUN 27 (*)    Creatinine, Ser 6.99 (*)    ALT 16 (*)  GFR calc non Af Amer 8 (*)    GFR calc Af Amer 10 (*)    Anion gap 17 (*)    All other components within normal limits  LIPASE, BLOOD  URINALYSIS, ROUTINE W REFLEX MICROSCOPIC  CBG MONITORING, ED    EKG  EKG Interpretation  Date/Time:  Thursday June 29 2016 05:01:03 EDT Ventricular Rate:  102 PR Interval:  180 QRS Duration: 95 QT Interval:  384 QTC Calculation: 501 R Axis:   36 Text Interpretation:  Sinus tachycardia Anterior infarct, old Nonspecific T abnormalities, lateral leads Prolonged QT interval Confirmed by Devoria Albe ([id number]) on 06/29/2016 5:16:40 AM       Radiology No results found.  Procedures Procedures (including critical care time)  Medications Ordered in ED Medications  metoCLOPramide (REGLAN) injection 10 mg (not administered)  diphenhydrAMINE (BENADRYL) injection 25 mg (not administered)  insulin regular (NOVOLIN R,HUMULIN R) 100 Units in sodium chloride 0.9 % 100 mL (1 Units/mL) infusion (not administered)  labetalol (NORMODYNE,TRANDATE) injection 10 mg (not administered)  sodium chloride 0.9 % bolus 500 mL (not administered)  sodium chloride 0.9 % bolus 1,000 mL (1,000 mLs Intravenous New Bag/Given 06/29/16 0205)  promethazine (PHENERGAN) injection 12.5 mg (12.5 mg Intravenous Given 06/29/16 0205)  hydrALAZINE (APRESOLINE) injection 10 mg (10 mg Intravenous Given 06/29/16 0252)  famotidine (PEPCID) IVPB 20 mg premix (0 mg Intravenous Stopped 06/29/16 0334)  promethazine (PHENERGAN) injection 12.5 mg (12.5 mg Intravenous Given 06/29/16 0301)  hydrALAZINE (APRESOLINE) injection 10 mg  (10 mg Intravenous Given 06/29/16 0438)  metoCLOPramide (REGLAN) injection 10 mg (10 mg Intravenous Given 06/29/16 0435)    CRITICAL CARE Performed by: Antony Madura   Total critical care time: 35 minutes  Critical care time was exclusive of separately billable procedures and treating other patients.  Critical care was necessary to treat or prevent imminent or life-threatening deterioration.  Critical care was time spent personally by me on the following activities: development of treatment plan with patient and/or surrogate as well as nursing, discussions with consultants, evaluation of patient's response to treatment, examination of patient, obtaining history from patient or surrogate, ordering and performing treatments and interventions, ordering and review of laboratory studies, ordering and review of radiographic studies, pulse oximetry and re-evaluation of patient's condition.   Initial Impression / Assessment and Plan / ED Course  I have reviewed the triage vital signs and the nursing notes.  Pertinent labs & imaging results that were available during my care of the patient were reviewed by me and considered in my medical decision making (see chart for details).    1:10 AM Patient with V/D which began just after 1400 today. He reports full dialysis session of 4 hours. No c/o CP or SOB. Patient states that he took his BP medication tonight despite vomiting. No sick contacts.  2:30 AM HTN noted. IV hydralazine ordered.  3:05 AM Patient with active vomiting of yellow emesis. Nonbloody. Additional phenergan given as well as Pepcid. Will continue to monitor BP.  4:28 AM BP 202/102 on recheck. Additional 10mg  IV hydralazine ordered. CT also pending, though patient reports some improvement to headache. No focal deficits noted on exam. Continues to c/o nausea. Vomiting has temporarily subsided. Reglan ordered. Will recheck EKG for persistent prolonged QT.  6:00 AM Patient continuing  to have nausea and vomiting despite antiemetics. Insulin ordered for hyperglycemia. Suspect mild DKA with gap of 17. QTc stable on repeat EKG. Case discussed between Dr. Lynelle Doctor and Dr. Julian Reil. TRH to admit.   Final  Clinical Impressions(s) / ED Diagnoses   Final diagnoses:  Intractable vomiting with nausea, unspecified vomiting type  Dehydration  Diabetic ketoacidosis without coma associated with type 2 diabetes mellitus [facility])  Essential hypertension    New Prescriptions New Prescriptions   No medications on file     Antony Madura, Cordelia Poche 06/29/16 0602    Devoria Albe, MD 06/29/16 [phone]

## 2016-06-29 NOTE — ED Notes (Signed)
Pt stated that he does not make urine.

## 2016-06-29 NOTE — ED Notes (Signed)
Pt states he is unable to urinate.

## 2016-06-30 DIAGNOSIS — R197 Diarrhea, unspecified: Secondary | ICD-10-CM

## 2016-06-30 DIAGNOSIS — R748 Abnormal levels of other serum enzymes: Secondary | ICD-10-CM

## 2016-06-30 DIAGNOSIS — I120 Hypertensive chronic kidney disease with stage 5 chronic kidney disease or end stage renal disease: Secondary | ICD-10-CM | POA: Diagnosis not present

## 2016-06-30 DIAGNOSIS — N2581 Secondary hyperparathyroidism of renal origin: Secondary | ICD-10-CM | POA: Diagnosis not present

## 2016-06-30 DIAGNOSIS — Z992 Dependence on renal dialysis: Secondary | ICD-10-CM | POA: Diagnosis not present

## 2016-06-30 DIAGNOSIS — E1151 Type 2 diabetes mellitus with diabetic peripheral angiopathy without gangrene: Secondary | ICD-10-CM | POA: Diagnosis not present

## 2016-06-30 DIAGNOSIS — I10 Essential (primary) hypertension: Secondary | ICD-10-CM

## 2016-06-30 DIAGNOSIS — S91311A Laceration without foreign body, right foot, initial encounter: Secondary | ICD-10-CM | POA: Diagnosis not present

## 2016-06-30 DIAGNOSIS — I161 Hypertensive emergency: Secondary | ICD-10-CM

## 2016-06-30 DIAGNOSIS — D631 Anemia in chronic kidney disease: Secondary | ICD-10-CM | POA: Diagnosis not present

## 2016-06-30 DIAGNOSIS — E1110 Type 2 diabetes mellitus with ketoacidosis without coma: Secondary | ICD-10-CM | POA: Diagnosis not present

## 2016-06-30 DIAGNOSIS — N186 End stage renal disease: Secondary | ICD-10-CM | POA: Diagnosis not present

## 2016-06-30 DIAGNOSIS — R112 Nausea with vomiting, unspecified: Secondary | ICD-10-CM | POA: Diagnosis not present

## 2016-06-30 DIAGNOSIS — E038 Other specified hypothyroidism: Secondary | ICD-10-CM | POA: Diagnosis not present

## 2016-06-30 DIAGNOSIS — A084 Viral intestinal infection, unspecified: Secondary | ICD-10-CM | POA: Diagnosis not present

## 2016-06-30 DIAGNOSIS — E860 Dehydration: Secondary | ICD-10-CM | POA: Diagnosis not present

## 2016-06-30 DIAGNOSIS — E1165 Type 2 diabetes mellitus with hyperglycemia: Secondary | ICD-10-CM | POA: Diagnosis not present

## 2016-06-30 LAB — CBC
HEMATOCRIT: 36.3 % — AB (ref 39.0–52.0)
Hemoglobin: 10.8 g/dL — ABNORMAL LOW (ref 13.0–17.0)
MCH: 26.5 pg (ref 26.0–34.0)
MCHC: 29.8 g/dL — AB (ref 30.0–36.0)
MCV: 89 fL (ref 78.0–100.0)
PLATELETS: 263 10*3/uL (ref 150–400)
RBC: 4.08 MIL/uL — ABNORMAL LOW (ref 4.22–5.81)
RDW: 16.3 % — AB (ref 11.5–15.5)
WBC: 12.2 10*3/uL — ABNORMAL HIGH (ref 4.0–10.5)

## 2016-06-30 LAB — BASIC METABOLIC PANEL
ANION GAP: 15 (ref 5–15)
BUN: 49 mg/dL — ABNORMAL HIGH (ref 6–20)
CHLORIDE: 96 mmol/L — AB (ref 101–111)
CO2: 27 mmol/L (ref 22–32)
Calcium: 9 mg/dL (ref 8.9–10.3)
Creatinine, Ser: 9.48 mg/dL — ABNORMAL HIGH (ref 0.61–1.24)
GFR calc non Af Amer: 6 mL/min — ABNORMAL LOW (ref >60)
GFR, EST AFRICAN AMERICAN: 7 mL/min — AB (ref >60)
Glucose, Bld: 130 mg/dL — ABNORMAL HIGH (ref 65–99)
POTASSIUM: 3.5 mmol/L (ref 3.5–5.1)
Sodium: 138 mmol/L (ref 135–145)

## 2016-06-30 LAB — TROPONIN I: TROPONIN I: 0.08 ng/mL — AB (ref <0.03)

## 2016-06-30 LAB — GLUCOSE, CAPILLARY
GLUCOSE-CAPILLARY: 264 mg/dL — AB (ref 65–99)
Glucose-Capillary: 223 mg/dL — ABNORMAL HIGH (ref 65–99)
Glucose-Capillary: 133 mg/dL — ABNORMAL HIGH (ref 65–99)

## 2016-06-30 LAB — PHOSPHORUS: Phosphorus: 7.4 mg/dL — ABNORMAL HIGH (ref 2.5–4.6)

## 2016-06-30 LAB — MAGNESIUM: Magnesium: 2.4 mg/dL (ref 1.7–2.4)

## 2016-06-30 NOTE — Progress Notes (Signed)
Purvis SheffieldSteven B Torpey to be D/C'd Home per MD order.  Discussed with the patient and all questions fully answered.  VSS, Skin clean, dry and intact without evidence of skin break down, no evidence of skin tears noted. IV catheter discontinued intact. Site without signs and symptoms of complications. Dressing and pressure applied.  An After Visit Summary was printed and given to the patient.   D/c education completed with patient/family including follow up instructions, medication list, d/c activities limitations if indicated, with other d/c instructions as indicated by MD - patient able to verbalize understanding, all questions fully answered.   Patient instructed to return to ED, call 911, or call MD for any changes in condition.   Patient escorted via WC, and D/C home via private auto.  Rock NephewLisbeth  Vanice Rappa 06/30/2016 7:54 PM

## 2016-06-30 NOTE — Discharge Summary (Signed)
Physician Discharge Summary  Russell [name] ZOX:[id number] DOB: Jun 20, 1970 DOA: 06/29/2016  PCP: Shade FloodGreene, Jeffrey R, MD  Admit date: 06/29/2016 Discharge date: 06/30/2016  Time spent: 45 minutes  Recommendations for Outpatient Follow-up:  Patient will be discharged to home.  Patient will need to follow up with primary care provider within one week of discharge, repeat CBC and discuss PET-CT scan.  Continue scheduled dialysis. Patient should continue medications as prescribed.  Patient should follow a Renal/Carb modified diet.   Discharge Diagnoses:  Nausea, vomiting, diarrhea Diabetes mellitus, type 2/hyperglycemia/early diabetic ketoacidosis Leukocytosis End-stage renal disease Skin tear Essential hypertension/accelerated hypertension GERD Chronic pain Coronary artery disease/mildly elevated troponin Hypothyroidism COPD Lymph nodes Insomnia  Discharge Condition: Stable  Diet recommendation: Renal/carb modified  Filed Weights   06/29/16 1649 06/30/16 0732 06/30/16 1115  Weight: 119 kg (262 lb 5.6 oz) 118.1 kg (260 lb 5.8 oz) 118.1 kg (260 lb 5.8 oz)    History of present illness:  On 06/29/2016 by Dr. Vernie Shanksavid Merrell Russell [name] is a 46 y.o. male with medical history significant of CAD, depression, DVT, ESRD on dialysis, hypertension, hypothyroidism. Patient presenting with one-day history of nausea, vomiting and diarrhea. Denies abdominal pain, fevers, dysuria, frequency, flank pain, chest pain, shortness breath, palpitations, cough. Patient states that he's had approximately 18 bouts of nonbloody emesis and approximately 10 episodes of loose nonbloody stools since onset of symptoms. Patient endorses compliance with his home medical regimen and dialysis sessions. Last dialysis session was on 06/28/2016. He denies any sick contacts. Patient is not tried anything for symptoms and states that they're getting worse. States that his home glucose readings are typically around  200.  Right heel laceration. Patient states that he ran his foot over the top of the nail causing the skin to repair open and bleed. Applied pressure with cessation of bleeding. No other therapies tried. Last tetanus shot less than 1 year ago.  Hospital Course:  Nausea, vomiting, diarrhea -Likely viral gastroenteritis versus a diabetic gastroparesis -Patient was placed on antiemetics and Reglan -Per patient, is feeling improved -Continue home Reglan and Phenergan on discharge -Patient was placed on diet, and was able to tolerate well  Diabetes mellitus, type 2/hyperglycemia/early diabetic ketoacidosis -Upon admission, patient did have elevated anion gap of 17 with hyperglycemia -Patient was placed on glucose stabilizer -Patient fairly recovered quicker than expected -Anion gap has now closed -Suspect hyperglycemia was secondary to the above process -Continue Lantus, insulin sliding scale a CBG monitoring  Leukocytosis -WBC count 12 -Currently denies any upper respiratory type infection or symptoms -Suspect reactive to the above processes -Discussed with patient, patient would like to follow his labs with his primary care doctor in 1 week  End-stage renal disease -Requiring hemodialysis, Monday, Wednesday, Friday -Status post dialysis today -Continue scheduled dialysis regimen -Nephrology was consulted and appreciated  Skin tear -Patient ran his foot over a nail -Patient did undergo slight debridement -Continue bacitracin ointment with daily dressing changes -Received last tetanus shot approximately one year ago  Essential hypertension/accelerated hypertension -Patient presented with a blood pressure of 224/125 -Secondary to inability to maintain oral intake -Blood pressure has improved -Continue Norvasc, Lopressor  GERD Continue PPI  Chronic pain Continue home regimen  Coronary artery disease/mildly elevated troponin -Troponin 0.04, similar to one month  ago -Troponin with mild increase to 0.08. Explained the risk of of not following trop and possibility of MI. Patient understands, however feels he is not having chest pain and feeling better today. Would like to  be discharged and will follow up with PCP and/or cardiologist. -Likely related to demand ischemia in an ESRD patient.  -Currently chest pain-free -Continue aspirin, Plavix, statin -Echocardiogram 05/17/2016 showed an EF of 55%, grade 2 diastolic dysfunction  Hypothyroidism -Continue Synthroid  COPD -Continue Combivent  Lymph nodes -CT chest on last admission in May 2018 noted lymph nodes -PET-CT recommended  Insomnia -Continue Atarax  Procedures: None  Consultations: Nephrology  Discharge Exam: Vitals:   06/30/16 1100 06/30/16 1115  BP: 121/70 (!) 105/49  Pulse: 82 85  Resp: 16 18  Temp:  98.2 F (36.8 C)   Patient feeling much better today. Able to tolerate food. Denies any current nausea, vomiting, abdominal pain, diarrhea. Denies any chest pain, shortness of breath, headache or dizziness.   General: Well developed, well nourished, NAD, appears stated age  HEENT: NCAT, PERRLA, EOMI, Anicteic Sclera, mucous membranes moist.  Neck: Supple, no JVD, no masses  Cardiovascular: S1 S2 auscultated, no rubs, murmurs or gallops. Regular rate and rhythm.  Respiratory: Clear to auscultation bilaterally with equal chest rise  Abdomen: Soft, nontender, nondistended, + bowel sounds  Extremities: warm dry without cyanosis clubbing or edema. Laceration on right heel  Neuro: AAOx3, nonfocal  Psych: Normal affect and demeanor with intact judgement and insight  Discharge Instructions Discharge Instructions    Discharge instructions    Complete by:  As directed    Patient will be discharged to home.  Patient will need to follow up with primary care provider within one week of discharge, repeat CBC and discuss PET-CT scan.  Continue scheduled dialysis. Patient should  continue medications as prescribed.  Patient should follow a Renal/Carb modified diet.     Current Discharge Medication List    CONTINUE these medications which have NOT CHANGED   Details  acetaminophen (TYLENOL) 500 MG tablet Take 1 tablet (500 mg total) by mouth every 6 (six) hours as needed for moderate pain. Qty: 30 tablet, Refills: 0    albuterol (PROVENTIL HFA;VENTOLIN HFA) 108 (90 Base) MCG/ACT inhaler Inhale 1 puff into the lungs every 6 (six) hours as needed for wheezing or shortness of breath. Qty: 1 Inhaler, Refills: 11   Associated Diagnoses: Asthma, mild persistent, uncomplicated    albuterol (PROVENTIL) (2.5 MG/3ML) 0.083% nebulizer solution Take 2.5 mg by nebulization every 6 (six) hours as needed for wheezing.    albuterol-ipratropium (COMBIVENT) 18-103 MCG/ACT inhaler Inhale 2 puffs into the lungs every 4 (four) hours. Qty: 1 Inhaler, Refills: 11   Associated Diagnoses: Asthma, chronic, mild persistent, uncomplicated    amLODipine (NORVASC) 10 MG tablet Take 1 tablet (10 mg total) by mouth daily. Qty: 90 tablet, Refills: 1   Associated Diagnoses: Essential hypertension    aspirin EC 81 MG EC tablet Take 1 tablet (81 mg total) by mouth daily.    atorvastatin (LIPITOR) 80 MG tablet Take 1 tablet (80 mg total) by mouth daily at 6 PM. Qty: 90 tablet, Refills: 1   Associated Diagnoses: Hyperlipidemia, unspecified hyperlipidemia type    calcitRIOL (ROCALTROL) 0.25 MCG capsule Take 1 capsule (0.25 mcg total) by mouth every Monday, Wednesday, and Friday with hemodialysis. Qty: 30 capsule, Refills: 3    calcium acetate (PHOSLO) 667 MG capsule Take 2 capsules (1,334 mg total) by mouth 3 (three) times daily with meals. Qty: 180 capsule, Refills: 0    clopidogrel (PLAVIX) 75 MG tablet Take 1 tablet (75 mg total) by mouth daily. Qty: 90 tablet, Refills: 3    cyclobenzaprine (FLEXERIL) 10 MG tablet Take  1 tablet (10 mg total) by mouth at bedtime. Qty: 30 tablet, Refills: 11    Associated Diagnoses: Myalgia    gabapentin (NEURONTIN) 100 MG capsule Take 2 capsules (200 mg total) by mouth 3 (three) times daily. Qty: 180 capsule, Refills: 11    HYDROmorphone (DILAUDID) 2 MG tablet Take 2 mg by mouth every 4 (four) hours as needed for severe pain.    hydrOXYzine (ATARAX/VISTARIL) 25 MG tablet Take 25 mg by mouth at bedtime.     insulin glargine (LANTUS) 100 UNIT/ML injection Inject 0.4 mLs (40 Units total) into the skin 2 (two) times daily. Qty: 10 mL, Refills: 0    insulin lispro (HUMALOG) 100 UNIT/ML injection Inject 0.15-0.3 mLs (15-30 Units total) into the skin every evening. Take one unit per 5 carbs depending on dinner. Qty: 10 mL, Refills: 2   Associated Diagnoses: Uncontrolled diabetes mellitus with peripheral artery disease (HCC)    levothyroxine (SYNTHROID, LEVOTHROID) 50 MCG tablet Take 1 tablet (50 mcg total) by mouth daily. Qty: 90 tablet, Refills: 3   Associated Diagnoses: Hypothyroidism, unspecified hypothyroidism type    metoCLOPramide (REGLAN) 5 MG tablet Take 1 tablet (5 mg total) by mouth every 8 (eight) hours as needed for nausea or vomiting. Qty: 30 tablet, Refills: 0   Associated Diagnoses: Gastroparesis    metoprolol (LOPRESSOR) 50 MG tablet Take 50 mg by mouth 2 (two) times daily.    pantoprazole (PROTONIX) 40 MG tablet Take 1 tablet (40 mg total) by mouth daily. Qty: 14 tablet, Refills: 0       Allergies  Allergen Reactions  . Ibuprofen Other (See Comments)    MD told patient not to take due to kidney disease  . Nsaids Other (See Comments)    Told to avoid all nsaids due to kidney disease   . Tape Other (See Comments)    Welts result, if left for a long amount of time   Follow-up Information    Shade Flood, MD. Schedule an appointment as soon as possible for a visit in 1 week(s).   Specialties:  Family Medicine, Sports Medicine Why:  Hospital follow up Contact information: [address] [place]  [address] [phone]            The results of significant diagnostics from this hospitalization (including imaging, microbiology, ancillary and laboratory) are listed below for reference.    Significant Diagnostic Studies: Ct Head Wo Contrast  Result Date: 06/29/2016 CLINICAL DATA:  46 y/o  M; headache and hypertension. EXAM: CT HEAD WITHOUT CONTRAST TECHNIQUE: Contiguous axial images were obtained from the base of the skull through the vertex without intravenous contrast. COMPARISON:  09/21/2014 CT head. FINDINGS: Brain: No evidence of acute infarction, hemorrhage, hydrocephalus, extra-axial collection or mass lesion/mass effect. Vascular: Calcific atherosclerosis of the carotid siphons. Skull: Normal. Negative for fracture or focal lesion. Sinuses/Orbits: Mild bilateral maxillary sinus mucosal thickening. Otherwise visualized paranasal sinuses and mastoid air cells are normally aerated. Orbits are unremarkable. Other: None. IMPRESSION: 1. No acute intracranial abnormality identified. 2. Mild maxillary sinus disease. 3. Otherwise unremarkable CT of head. Electronically Signed   By: Mitzi Hansen M.D.   On: 06/29/2016 06:19    Microbiology: No results found for this or any previous visit (from the past 240 hour(s)).   Labs: Basic Metabolic Panel:  Recent Labs Lab 06/29/16 0208 06/29/16 1055 06/30/16 0520  NA 136 138 138  K 4.4 4.3 3.5  CL 93* 98* 96*  CO2 26 25 27   GLUCOSE 406*  369* 130*  BUN 27* 38* 49*  CREATININE 6.99* 7.35* 9.48*  CALCIUM 9.9 9.4 9.0  MG  --  2.2 2.4  PHOS  --  5.4* 7.4*   Liver Function Tests:  Recent Labs Lab 06/29/16 0208  AST 23  ALT 16*  ALKPHOS 98  BILITOT 1.0  PROT 7.4  ALBUMIN 4.0    Recent Labs Lab 06/29/16 0208  LIPASE 28   No results for input(s): AMMONIA in the last 168 hours. CBC:  Recent Labs Lab 06/29/16 0208 06/30/16 0520  WBC 10.7* 12.2*  HGB 12.7* 10.8*  HCT 39.0 36.3*  MCV 86.9 89.0  PLT 294 263    Cardiac Enzymes:  Recent Labs Lab 06/29/16 1137  TROPONINI 0.04*   BNP: BNP (last 3 results)  Recent Labs  01/10/16 1834 05/16/16 1740 05/23/16 1558  BNP 182.8* 662.2* 299.0*    ProBNP (last 3 results) No results for input(s): PROBNP in the last 8760 hours.  CBG:  Recent Labs Lab 06/29/16 1154 06/29/16 1658 06/29/16 2327 06/30/16 0729 06/30/16 1310  GLUCAP 328* 250* 270* 223* 264*       Signed:  Edsel Petrin  [facility] 06/30/2016, 3:21 PM

## 2016-06-30 NOTE — Progress Notes (Signed)
Inpatient Diabetes Program Recommendations  AACE/ADA: New Consensus Statement on Inpatient Glycemic Control (2015)  Target Ranges:  Prepandial:   less than 140 mg/dL      Peak postprandial:   less than 180 mg/dL (1-2 hours)      Critically ill patients:  140 - 180 mg/dL  Results for Russell [name], [place] B (MRN [id number]) as of 06/30/2016 08:29  Ref. Range 06/29/2016 07:05 06/29/2016 08:48 06/29/2016 09:53 06/29/2016 11:54 06/29/2016 16:58 06/29/2016 23:27 06/30/2016 07:29  Glucose-Capillary Latest Ref Range: 65 - 99 mg/dL 409429 (H) 811418 (H) 914379 (H) 328 (H) 250 (H) 270 (H) 223 (H)   Results for Russell [name], [place] B (MRN [id number]) as of 06/30/2016 08:29  Ref. Range 05/17/2016 02:08  Hemoglobin A1C Latest Ref Range: 4.8 - 5.6 % 12.8 (H)   Review of Glycemic Control  Diabetes history: DM2 Outpatient Diabetes medications: Lantus 40 units BID, Humalog 15-30 units QPM Current orders for Inpatient glycemic control: Lantus 40 units BID, Novolog 0-15 units TID with meals, Novolog 0-5 units QHS  Inpatient Diabetes Program Recommendations Insulin - Basal: Please consider increasing Lantus to 43 units BID. Insulin - Meal Coverage: Once diet is advanced, please consider ordering Novolog 6 units TID with meals for meal coverage if patient eats at  least 50% of meals. HgbA1C: A1C 12.8% on 05/17/16 and patient was seen by Diabetes Coordinator on 05/24/16 regarding DM control and management.  Thanks, Orlando PennerMarie Seanna Sisler, RN, MSN, CDE Diabetes Coordinator Inpatient Diabetes Program [phone] (Team Pager from 8am to 5pm)

## 2016-06-30 NOTE — Discharge Instructions (Signed)

## 2016-06-30 NOTE — Procedures (Signed)
Pt on HD now off schedule.  Below dry wt due to N/V/D.  Tolerating HD well, stable BFR and VP's.  Will follow.   I was present at this dialysis session, have reviewed the session itself and made  appropriate changes Vinson Moselleob Talib Headley MD [facility] pager [phone]   06/30/2016, 10:54 AM

## 2016-07-01 DIAGNOSIS — N186 End stage renal disease: Secondary | ICD-10-CM | POA: Diagnosis not present

## 2016-07-01 DIAGNOSIS — Z992 Dependence on renal dialysis: Secondary | ICD-10-CM | POA: Diagnosis not present

## 2016-07-01 DIAGNOSIS — N049 Nephrotic syndrome with unspecified morphologic changes: Secondary | ICD-10-CM | POA: Diagnosis not present

## 2016-07-03 DIAGNOSIS — N2581 Secondary hyperparathyroidism of renal origin: Secondary | ICD-10-CM | POA: Diagnosis not present

## 2016-07-03 DIAGNOSIS — D631 Anemia in chronic kidney disease: Secondary | ICD-10-CM | POA: Diagnosis not present

## 2016-07-03 DIAGNOSIS — E211 Secondary hyperparathyroidism, not elsewhere classified: Secondary | ICD-10-CM | POA: Diagnosis not present

## 2016-07-03 DIAGNOSIS — N186 End stage renal disease: Secondary | ICD-10-CM | POA: Diagnosis not present

## 2016-07-04 NOTE — Telephone Encounter (Signed)
I called [facility] and they stated they don't do PET-CT, those would have to be done at the hospital so she couldn't answer the question.

## 2016-07-05 DIAGNOSIS — D631 Anemia in chronic kidney disease: Secondary | ICD-10-CM | POA: Diagnosis not present

## 2016-07-05 DIAGNOSIS — N2581 Secondary hyperparathyroidism of renal origin: Secondary | ICD-10-CM | POA: Diagnosis not present

## 2016-07-05 DIAGNOSIS — E211 Secondary hyperparathyroidism, not elsewhere classified: Secondary | ICD-10-CM | POA: Diagnosis not present

## 2016-07-05 DIAGNOSIS — N186 End stage renal disease: Secondary | ICD-10-CM | POA: Diagnosis not present

## 2016-07-05 NOTE — Telephone Encounter (Signed)
So do we need to clarify that order with the hospital if that is where it needs to be performed? Thanks.

## 2016-07-06 NOTE — Telephone Encounter (Signed)
Please place the order for Nuc Med PET Initial Staging. Code [id number]. Will schedule once order is placed.

## 2016-07-06 NOTE — Telephone Encounter (Signed)
Pet scan ordered due to CT results:   Interval enlargement of 2 lymph nodes in the right paratracheal mediastinum, measurements and positions given above, largest measuring 1.9 cm short axis dimension, both pathologic by CT size criteria. Neoplastic lymphadenopathy cannot be excluded. Consider further characterization with nonemergent PET-CT. **

## 2016-07-07 DIAGNOSIS — N186 End stage renal disease: Secondary | ICD-10-CM | POA: Diagnosis not present

## 2016-07-07 DIAGNOSIS — E211 Secondary hyperparathyroidism, not elsewhere classified: Secondary | ICD-10-CM | POA: Diagnosis not present

## 2016-07-07 DIAGNOSIS — D631 Anemia in chronic kidney disease: Secondary | ICD-10-CM | POA: Diagnosis not present

## 2016-07-07 DIAGNOSIS — N2581 Secondary hyperparathyroidism of renal origin: Secondary | ICD-10-CM | POA: Diagnosis not present

## 2016-07-10 DIAGNOSIS — N186 End stage renal disease: Secondary | ICD-10-CM | POA: Diagnosis not present

## 2016-07-10 DIAGNOSIS — N2581 Secondary hyperparathyroidism of renal origin: Secondary | ICD-10-CM | POA: Diagnosis not present

## 2016-07-10 DIAGNOSIS — E211 Secondary hyperparathyroidism, not elsewhere classified: Secondary | ICD-10-CM | POA: Diagnosis not present

## 2016-07-10 DIAGNOSIS — D631 Anemia in chronic kidney disease: Secondary | ICD-10-CM | POA: Diagnosis not present

## 2016-07-12 DIAGNOSIS — E211 Secondary hyperparathyroidism, not elsewhere classified: Secondary | ICD-10-CM | POA: Diagnosis not present

## 2016-07-12 DIAGNOSIS — N186 End stage renal disease: Secondary | ICD-10-CM | POA: Diagnosis not present

## 2016-07-12 DIAGNOSIS — D631 Anemia in chronic kidney disease: Secondary | ICD-10-CM | POA: Diagnosis not present

## 2016-07-12 DIAGNOSIS — N2581 Secondary hyperparathyroidism of renal origin: Secondary | ICD-10-CM | POA: Diagnosis not present

## 2016-07-14 DIAGNOSIS — N186 End stage renal disease: Secondary | ICD-10-CM | POA: Diagnosis not present

## 2016-07-14 DIAGNOSIS — D631 Anemia in chronic kidney disease: Secondary | ICD-10-CM | POA: Diagnosis not present

## 2016-07-14 DIAGNOSIS — N2581 Secondary hyperparathyroidism of renal origin: Secondary | ICD-10-CM | POA: Diagnosis not present

## 2016-07-14 DIAGNOSIS — E211 Secondary hyperparathyroidism, not elsewhere classified: Secondary | ICD-10-CM | POA: Diagnosis not present

## 2016-07-17 DIAGNOSIS — N2581 Secondary hyperparathyroidism of renal origin: Secondary | ICD-10-CM | POA: Diagnosis not present

## 2016-07-17 DIAGNOSIS — D631 Anemia in chronic kidney disease: Secondary | ICD-10-CM | POA: Diagnosis not present

## 2016-07-17 DIAGNOSIS — E211 Secondary hyperparathyroidism, not elsewhere classified: Secondary | ICD-10-CM | POA: Diagnosis not present

## 2016-07-17 DIAGNOSIS — N186 End stage renal disease: Secondary | ICD-10-CM | POA: Diagnosis not present

## 2016-07-19 DIAGNOSIS — E211 Secondary hyperparathyroidism, not elsewhere classified: Secondary | ICD-10-CM | POA: Diagnosis not present

## 2016-07-19 DIAGNOSIS — D631 Anemia in chronic kidney disease: Secondary | ICD-10-CM | POA: Diagnosis not present

## 2016-07-19 DIAGNOSIS — F321 Major depressive disorder, single episode, moderate: Secondary | ICD-10-CM | POA: Diagnosis not present

## 2016-07-19 DIAGNOSIS — N2581 Secondary hyperparathyroidism of renal origin: Secondary | ICD-10-CM | POA: Diagnosis not present

## 2016-07-19 DIAGNOSIS — F411 Generalized anxiety disorder: Secondary | ICD-10-CM | POA: Diagnosis not present

## 2016-07-19 DIAGNOSIS — N186 End stage renal disease: Secondary | ICD-10-CM | POA: Diagnosis not present

## 2016-07-21 DIAGNOSIS — E211 Secondary hyperparathyroidism, not elsewhere classified: Secondary | ICD-10-CM | POA: Diagnosis not present

## 2016-07-21 DIAGNOSIS — N186 End stage renal disease: Secondary | ICD-10-CM | POA: Diagnosis not present

## 2016-07-21 DIAGNOSIS — D631 Anemia in chronic kidney disease: Secondary | ICD-10-CM | POA: Diagnosis not present

## 2016-07-21 DIAGNOSIS — N2581 Secondary hyperparathyroidism of renal origin: Secondary | ICD-10-CM | POA: Diagnosis not present

## 2016-07-24 DIAGNOSIS — N186 End stage renal disease: Secondary | ICD-10-CM | POA: Diagnosis not present

## 2016-07-24 DIAGNOSIS — N2581 Secondary hyperparathyroidism of renal origin: Secondary | ICD-10-CM | POA: Diagnosis not present

## 2016-07-24 DIAGNOSIS — E211 Secondary hyperparathyroidism, not elsewhere classified: Secondary | ICD-10-CM | POA: Diagnosis not present

## 2016-07-24 DIAGNOSIS — D631 Anemia in chronic kidney disease: Secondary | ICD-10-CM | POA: Diagnosis not present

## 2016-07-26 DIAGNOSIS — E211 Secondary hyperparathyroidism, not elsewhere classified: Secondary | ICD-10-CM | POA: Diagnosis not present

## 2016-07-26 DIAGNOSIS — N186 End stage renal disease: Secondary | ICD-10-CM | POA: Diagnosis not present

## 2016-07-26 DIAGNOSIS — N2581 Secondary hyperparathyroidism of renal origin: Secondary | ICD-10-CM | POA: Diagnosis not present

## 2016-07-26 DIAGNOSIS — D631 Anemia in chronic kidney disease: Secondary | ICD-10-CM | POA: Diagnosis not present

## 2016-07-27 ENCOUNTER — Ambulatory Visit (HOSPITAL_COMMUNITY)
Admission: RE | Admit: 2016-07-27 | Discharge: 2016-07-27 | Disposition: A | Discharge status: Home / Self Care | Payer: Medicare Other | Source: Ambulatory Visit | Attending: Family Medicine | Admitting: Family Medicine

## 2016-07-27 ENCOUNTER — Telehealth: Payer: Self-pay | Admitting: Family Medicine

## 2016-07-27 DIAGNOSIS — I700 Atherosclerosis of aorta: Secondary | ICD-10-CM | POA: Diagnosis not present

## 2016-07-27 DIAGNOSIS — Z794 Long term (current) use of insulin: Secondary | ICD-10-CM | POA: Insufficient documentation

## 2016-07-27 DIAGNOSIS — Z7982 Long term (current) use of aspirin: Secondary | ICD-10-CM | POA: Insufficient documentation

## 2016-07-27 DIAGNOSIS — R599 Enlarged lymph nodes, unspecified: Secondary | ICD-10-CM | POA: Diagnosis not present

## 2016-07-27 DIAGNOSIS — R938 Abnormal findings on diagnostic imaging of other specified body structures: Secondary | ICD-10-CM | POA: Insufficient documentation

## 2016-07-27 DIAGNOSIS — Z7902 Long term (current) use of antithrombotics/antiplatelets: Secondary | ICD-10-CM | POA: Diagnosis not present

## 2016-07-27 DIAGNOSIS — R590 Localized enlarged lymph nodes: Secondary | ICD-10-CM | POA: Diagnosis not present

## 2016-07-27 DIAGNOSIS — Z951 Presence of aortocoronary bypass graft: Secondary | ICD-10-CM | POA: Diagnosis not present

## 2016-07-27 DIAGNOSIS — I517 Cardiomegaly: Secondary | ICD-10-CM | POA: Diagnosis not present

## 2016-07-27 DIAGNOSIS — Z79899 Other long term (current) drug therapy: Secondary | ICD-10-CM | POA: Diagnosis not present

## 2016-07-27 DIAGNOSIS — R9389 Abnormal findings on diagnostic imaging of other specified body structures: Secondary | ICD-10-CM

## 2016-07-27 LAB — GLUCOSE, CAPILLARY: GLUCOSE-CAPILLARY: 232 mg/dL — AB (ref 65–99)

## 2016-07-27 MED ORDER — FLUDEOXYGLUCOSE F - 18 (FDG) INJECTION
12.9700 | Freq: Once | INTRAVENOUS | Status: AC | PRN
  Administered 2016-07-27: 12.97 via INTRAVENOUS

## 2016-07-27 NOTE — Telephone Encounter (Signed)
DR Neva SeatGREENE PT CALLING FOR RESULTS OF HIS PET SCAN

## 2016-07-28 DIAGNOSIS — E211 Secondary hyperparathyroidism, not elsewhere classified: Secondary | ICD-10-CM | POA: Diagnosis not present

## 2016-07-28 DIAGNOSIS — N2581 Secondary hyperparathyroidism of renal origin: Secondary | ICD-10-CM | POA: Diagnosis not present

## 2016-07-28 DIAGNOSIS — N186 End stage renal disease: Secondary | ICD-10-CM | POA: Diagnosis not present

## 2016-07-28 DIAGNOSIS — D631 Anemia in chronic kidney disease: Secondary | ICD-10-CM | POA: Diagnosis not present

## 2016-07-31 DIAGNOSIS — D631 Anemia in chronic kidney disease: Secondary | ICD-10-CM | POA: Diagnosis not present

## 2016-07-31 DIAGNOSIS — N186 End stage renal disease: Secondary | ICD-10-CM | POA: Diagnosis not present

## 2016-07-31 DIAGNOSIS — N2581 Secondary hyperparathyroidism of renal origin: Secondary | ICD-10-CM | POA: Diagnosis not present

## 2016-07-31 DIAGNOSIS — E211 Secondary hyperparathyroidism, not elsewhere classified: Secondary | ICD-10-CM | POA: Diagnosis not present

## 2016-07-31 NOTE — Telephone Encounter (Signed)
Please advise 

## 2016-08-01 DIAGNOSIS — N049 Nephrotic syndrome with unspecified morphologic changes: Secondary | ICD-10-CM | POA: Diagnosis not present

## 2016-08-01 DIAGNOSIS — Z992 Dependence on renal dialysis: Secondary | ICD-10-CM | POA: Diagnosis not present

## 2016-08-01 DIAGNOSIS — N186 End stage renal disease: Secondary | ICD-10-CM | POA: Diagnosis not present

## 2016-08-01 NOTE — Telephone Encounter (Signed)
My chart message with results sent to patient

## 2016-08-02 DIAGNOSIS — E211 Secondary hyperparathyroidism, not elsewhere classified: Secondary | ICD-10-CM | POA: Diagnosis not present

## 2016-08-02 DIAGNOSIS — N186 End stage renal disease: Secondary | ICD-10-CM | POA: Diagnosis not present

## 2016-08-02 DIAGNOSIS — N2581 Secondary hyperparathyroidism of renal origin: Secondary | ICD-10-CM | POA: Diagnosis not present

## 2016-08-02 DIAGNOSIS — D631 Anemia in chronic kidney disease: Secondary | ICD-10-CM | POA: Diagnosis not present

## 2016-08-03 ENCOUNTER — Ambulatory Visit: Payer: Medicare Other | Admitting: Cardiology

## 2016-08-04 DIAGNOSIS — N2581 Secondary hyperparathyroidism of renal origin: Secondary | ICD-10-CM | POA: Diagnosis not present

## 2016-08-04 DIAGNOSIS — N186 End stage renal disease: Secondary | ICD-10-CM | POA: Diagnosis not present

## 2016-08-04 DIAGNOSIS — D631 Anemia in chronic kidney disease: Secondary | ICD-10-CM | POA: Diagnosis not present

## 2016-08-04 DIAGNOSIS — E211 Secondary hyperparathyroidism, not elsewhere classified: Secondary | ICD-10-CM | POA: Diagnosis not present

## 2016-08-07 DIAGNOSIS — E211 Secondary hyperparathyroidism, not elsewhere classified: Secondary | ICD-10-CM | POA: Diagnosis not present

## 2016-08-07 DIAGNOSIS — N2581 Secondary hyperparathyroidism of renal origin: Secondary | ICD-10-CM | POA: Diagnosis not present

## 2016-08-07 DIAGNOSIS — N186 End stage renal disease: Secondary | ICD-10-CM | POA: Diagnosis not present

## 2016-08-07 DIAGNOSIS — D631 Anemia in chronic kidney disease: Secondary | ICD-10-CM | POA: Diagnosis not present

## 2016-08-09 DIAGNOSIS — E211 Secondary hyperparathyroidism, not elsewhere classified: Secondary | ICD-10-CM | POA: Diagnosis not present

## 2016-08-09 DIAGNOSIS — N2581 Secondary hyperparathyroidism of renal origin: Secondary | ICD-10-CM | POA: Diagnosis not present

## 2016-08-09 DIAGNOSIS — D631 Anemia in chronic kidney disease: Secondary | ICD-10-CM | POA: Diagnosis not present

## 2016-08-09 DIAGNOSIS — N186 End stage renal disease: Secondary | ICD-10-CM | POA: Diagnosis not present

## 2016-08-11 DIAGNOSIS — N2581 Secondary hyperparathyroidism of renal origin: Secondary | ICD-10-CM | POA: Diagnosis not present

## 2016-08-11 DIAGNOSIS — E211 Secondary hyperparathyroidism, not elsewhere classified: Secondary | ICD-10-CM | POA: Diagnosis not present

## 2016-08-11 DIAGNOSIS — D631 Anemia in chronic kidney disease: Secondary | ICD-10-CM | POA: Diagnosis not present

## 2016-08-11 DIAGNOSIS — N186 End stage renal disease: Secondary | ICD-10-CM | POA: Diagnosis not present

## 2016-08-14 DIAGNOSIS — N2581 Secondary hyperparathyroidism of renal origin: Secondary | ICD-10-CM | POA: Diagnosis not present

## 2016-08-14 DIAGNOSIS — N186 End stage renal disease: Secondary | ICD-10-CM | POA: Diagnosis not present

## 2016-08-14 DIAGNOSIS — D631 Anemia in chronic kidney disease: Secondary | ICD-10-CM | POA: Diagnosis not present

## 2016-08-14 DIAGNOSIS — E211 Secondary hyperparathyroidism, not elsewhere classified: Secondary | ICD-10-CM | POA: Diagnosis not present

## 2016-08-16 DIAGNOSIS — D631 Anemia in chronic kidney disease: Secondary | ICD-10-CM | POA: Diagnosis not present

## 2016-08-16 DIAGNOSIS — N186 End stage renal disease: Secondary | ICD-10-CM | POA: Diagnosis not present

## 2016-08-16 DIAGNOSIS — N2581 Secondary hyperparathyroidism of renal origin: Secondary | ICD-10-CM | POA: Diagnosis not present

## 2016-08-16 DIAGNOSIS — E211 Secondary hyperparathyroidism, not elsewhere classified: Secondary | ICD-10-CM | POA: Diagnosis not present

## 2016-08-17 ENCOUNTER — Ambulatory Visit (INDEPENDENT_AMBULATORY_CARE_PROVIDER_SITE_OTHER): Payer: Medicare Other | Admitting: Physician Assistant

## 2016-08-17 ENCOUNTER — Encounter: Payer: Self-pay | Admitting: Family Medicine

## 2016-08-17 ENCOUNTER — Ambulatory Visit (HOSPITAL_COMMUNITY)
Admission: RE | Admit: 2016-08-17 | Discharge: 2016-08-17 | Disposition: A | Discharge status: Home / Self Care | Payer: Medicare Other | Source: Ambulatory Visit | Attending: Family Medicine | Admitting: Family Medicine

## 2016-08-17 ENCOUNTER — Encounter: Payer: Self-pay | Admitting: Physician Assistant

## 2016-08-17 ENCOUNTER — Ambulatory Visit (INDEPENDENT_AMBULATORY_CARE_PROVIDER_SITE_OTHER): Payer: Medicare Other | Admitting: Family Medicine

## 2016-08-17 ENCOUNTER — Telehealth: Payer: Self-pay | Admitting: Family Medicine

## 2016-08-17 VITALS — BP 180/96 | HR 80 | Ht 74.0 in | Wt 264.0 lb

## 2016-08-17 VITALS — BP 150/89 | HR 93 | Temp 97.9°F | Resp 16 | Ht 74.0 in | Wt 267.4 lb

## 2016-08-17 DIAGNOSIS — E039 Hypothyroidism, unspecified: Secondary | ICD-10-CM

## 2016-08-17 DIAGNOSIS — J9611 Chronic respiratory failure with hypoxia: Secondary | ICD-10-CM | POA: Diagnosis not present

## 2016-08-17 DIAGNOSIS — R0602 Shortness of breath: Secondary | ICD-10-CM

## 2016-08-17 DIAGNOSIS — N186 End stage renal disease: Secondary | ICD-10-CM | POA: Diagnosis not present

## 2016-08-17 DIAGNOSIS — I200 Unstable angina: Secondary | ICD-10-CM

## 2016-08-17 DIAGNOSIS — M79605 Pain in left leg: Secondary | ICD-10-CM

## 2016-08-17 DIAGNOSIS — I10 Essential (primary) hypertension: Secondary | ICD-10-CM

## 2016-08-17 DIAGNOSIS — R600 Localized edema: Secondary | ICD-10-CM | POA: Diagnosis not present

## 2016-08-17 DIAGNOSIS — M79604 Pain in right leg: Secondary | ICD-10-CM

## 2016-08-17 DIAGNOSIS — E1141 Type 2 diabetes mellitus with diabetic mononeuropathy: Secondary | ICD-10-CM

## 2016-08-17 DIAGNOSIS — I824Y9 Acute embolism and thrombosis of unspecified deep veins of unspecified proximal lower extremity: Secondary | ICD-10-CM

## 2016-08-17 DIAGNOSIS — R05 Cough: Secondary | ICD-10-CM | POA: Diagnosis not present

## 2016-08-17 DIAGNOSIS — M7989 Other specified soft tissue disorders: Secondary | ICD-10-CM

## 2016-08-17 DIAGNOSIS — R059 Cough, unspecified: Secondary | ICD-10-CM

## 2016-08-17 DIAGNOSIS — R079 Chest pain, unspecified: Secondary | ICD-10-CM | POA: Diagnosis not present

## 2016-08-17 DIAGNOSIS — I2510 Atherosclerotic heart disease of native coronary artery without angina pectoris: Secondary | ICD-10-CM | POA: Diagnosis not present

## 2016-08-17 DIAGNOSIS — Z992 Dependence on renal dialysis: Secondary | ICD-10-CM

## 2016-08-17 DIAGNOSIS — Z86718 Personal history of other venous thrombosis and embolism: Secondary | ICD-10-CM

## 2016-08-17 DIAGNOSIS — Z01818 Encounter for other preprocedural examination: Secondary | ICD-10-CM | POA: Diagnosis not present

## 2016-08-17 DIAGNOSIS — I7090 Unspecified atherosclerosis: Secondary | ICD-10-CM

## 2016-08-17 DIAGNOSIS — E119 Type 2 diabetes mellitus without complications: Secondary | ICD-10-CM

## 2016-08-17 DIAGNOSIS — R0789 Other chest pain: Secondary | ICD-10-CM | POA: Diagnosis not present

## 2016-08-17 LAB — TROPONIN I: TROPONIN I: 0.03 ng/mL (ref 0.00–0.04)

## 2016-08-17 LAB — TSH: TSH: 4.9 u[IU]/mL — AB (ref 0.450–4.500)

## 2016-08-17 MED ORDER — AMLODIPINE BESYLATE 10 MG PO TABS: 10.0000 mg | ORAL_TABLET | Freq: Every day | ORAL | 1 refills | Status: DC

## 2016-08-17 MED ORDER — HYDROCODONE-ACETAMINOPHEN 5-325 MG PO TABS: 1.0000 | ORAL_TABLET | ORAL | 0 refills | Status: DC | PRN

## 2016-08-17 MED ORDER — LEVOTHYROXINE SODIUM 50 MCG PO TABS: 50.0000 ug | ORAL_TABLET | Freq: Every day | ORAL | 0 refills | Status: DC

## 2016-08-17 NOTE — Patient Instructions (Signed)
Medication Instructions:  STOP PLAVIX TAKE AMLODIPINE DAILY-EXCEPT-NOT ON DIALYSIS DAYS If you need a refill on your cardiac medications before your next appointment, please call your pharmacy.  Testing/Procedures: Your physician has requested that you have a lexiscan myoview. For further information please visit [email or web address]. Please follow instruction sheet, as given.   Follow-Up: Your physician wants you to follow-up in: 3 MONTHS WITH DR Jens SomRENSHAW   Special Instructions: PLEASE BRING YOUR BP DIARY TO FOLLOW UP VISIT WITH DR Jens SomRENSHAW  [facility] you for choosing [facility]

## 2016-08-17 NOTE — Telephone Encounter (Signed)
Pt states that they are sending pt home from hospital and a procedure and he wants to know what Dr. Clelia CroftShaw wants him to do next   Best number [phone]

## 2016-08-17 NOTE — Progress Notes (Addendum)
Subjective:    Patient ID: Russell [name], male    DOB: 08/30/1970, 46 y.o.   MRN: [id number] Chief Complaint  Patient presents with  . Leg Swelling    right leg x 3 days.  Pain level between 8-9/10    HPI Has been taking plavix daily and stopped today per cardiology instructions His fiance is Nadju Ford. Was taken off xarelto after blood clot was gone but doppplers - was unprovokes. Just had PET scan last mo - 76/26   No h/o gout  2 weeks ago started having changes in heart rate with excruciating HAs BP 225/115-130 with horrible HA. + SHoB, +cough - non-productive, no hemoptysis.   Had a burritto.  Past Medical History:  Diagnosis Date  . Anxiety   . Asthma   . 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 Daily headache   . Depression   . DVT (deep venous thrombosis), H/o 01/2014-on Xarelto 03/24/2014   LLE  . ESRD (end stage renal disease) on dialysis [facility])    "[place], MWF" (06/29/2016)  . Hypertension   . Hypothyroidism   . Nephrotic syndrome 05/18/2014  . Type 2 diabetes mellitus with diabetic nephropathy [facility])    Past Surgical History:  Procedure Laterality Date  . ANKLE FRACTURE SURGERY Right 1988  . AV FISTULA PLACEMENT Left 01/01/2015   Procedure: CREATION OF LEFT RADIAL CEPHALIC ARTERIOVENOUS (AV) FISTULA ;  Surgeon: Pryor Ochoa, MD;  Location: [facility] OR;  Service: Vascular;  Laterality: Left;  . CAPD REMOVAL N/A 05/19/2016   Procedure: PD CATH REMOVAL;  Surgeon: Abigail Miyamoto, MD;  Location: [facility] OR;  Service: General;  Laterality: N/A;  . CARDIAC CATHETERIZATION N/A 09/22/2015   Procedure: Left Heart Cath and Coronary Angiography;  Surgeon: Iran Ouch, MD;  Location: [facility] INVASIVE CV LAB;  Service: Cardiovascular;  Laterality: N/A;  . CORONARY ARTERY BYPASS GRAFT N/A 09/28/2015   Procedure: CORONARY ARTERY BYPASS  GRAFTING (CABG) x 3 UTILIZING LEFT MAMMARY ARTERY AND ENDOSCOPICALLY HARVESTED LEFT GREATER SAPHENOUS VEIN.;  Surgeon: Delight Ovens, MD;  Location: [facility] OR;  Service: Open Heart Surgery;  Laterality: N/A;  . ENDOVEIN HARVEST OF GREATER SAPHENOUS VEIN Left 09/28/2015   Procedure: ENDOVEIN HARVEST OF GREATER SAPHENOUS VEIN;  Surgeon: Delight Ovens, MD;  Location: [facility] OR;  Service: Open Heart Surgery;  Laterality: Left;  . ESOPHAGOGASTRODUODENOSCOPY N/A 03/29/2014   Procedure: ESOPHAGOGASTRODUODENOSCOPY (EGD);  Surgeon: Dorena Cookey, MD;  Location: [facility];  Service: Endoscopy;  Laterality: N/A;  . EYE SURGERY    . FRACTURE SURGERY    . INSERTION OF DIALYSIS CATHETER Right 01/05/2015   Procedure: INSERTION OF RIGHT INTERNAL JUGULAR DIALYSIS CATHETER;  Surgeon: Fransisco Hertz, MD;  Location: [facility] OR;  Service: Vascular;  Laterality: Right;  . RETINAL LASER PROCEDURE Bilateral   . TEE WITHOUT CARDIOVERSION N/A 09/28/2015   Procedure: TRANSESOPHAGEAL ECHOCARDIOGRAM (TEE);  Surgeon: Delight Ovens, MD;  Location: [facility] OR;  Service: Open Heart Surgery;  Laterality: N/A;  . TONSILLECTOMY AND ADENOIDECTOMY  1970s   Current Outpatient Prescriptions on File Prior to Visit  Medication Sig Dispense Refill  . acetaminophen (TYLENOL) 500 MG tablet Take 1 tablet (500 mg total) by mouth every 6 (six) hours as needed for moderate pain. (Patient taking differently: Take 1,000 mg by mouth every 6 (six) hours as needed (for pain). ) 30  tablet 0  . albuterol (PROVENTIL HFA;VENTOLIN HFA) 108 (90 Base) MCG/ACT inhaler Inhale 1 puff into the lungs every 6 (six) hours as needed for wheezing or shortness of breath. 1 Inhaler 11  . albuterol (PROVENTIL) (2.5 MG/3ML) 0.083% nebulizer solution Take 2.5 mg by nebulization every 6 (six) hours as needed for wheezing.    Marland Kitchen albuterol-ipratropium (COMBIVENT) 18-103 MCG/ACT inhaler Inhale 2 puffs into the lungs every 4 (four) hours. (Patient taking differently: Inhale 2 puffs into  the lungs every 6 (six) hours as needed for wheezing or shortness of breath. ) 1 Inhaler 11  . amLODipine (NORVASC) 10 MG tablet Take 1 tablet (10 mg total) by mouth daily. DO NOT TAKE ON DIALYSIS DAYS 90 tablet 1  . aspirin EC 81 MG EC tablet Take 1 tablet (81 mg total) by mouth daily.    Marland Kitchen atorvastatin (LIPITOR) 80 MG tablet Take 1 tablet (80 mg total) by mouth daily at 6 PM. 90 tablet 1  . calcitRIOL (ROCALTROL) 0.25 MCG capsule Take 1 capsule (0.25 mcg total) by mouth every Monday, Wednesday, and Friday with hemodialysis. (Patient taking differently: Take 0.25 mcg by mouth See admin instructions. Tuesday Thursday Saturday) 30 capsule 3  . calcium acetate (PHOSLO) 667 MG capsule Take 2 capsules (1,334 mg total) by mouth 3 (three) times daily with meals. 180 capsule 0  . cyclobenzaprine (FLEXERIL) 10 MG tablet Take 1 tablet (10 mg total) by mouth at bedtime. 30 tablet 11  . gabapentin (NEURONTIN) 100 MG capsule Take 2 capsules (200 mg total) by mouth 3 (three) times daily. (Patient taking differently: Take 800 mg by mouth 2 (two) times daily. ) 180 capsule 11  . hydrOXYzine (ATARAX/VISTARIL) 25 MG tablet Take 25 mg by mouth at bedtime.     . insulin glargine (LANTUS) 100 UNIT/ML injection Inject 0.4 mLs (40 Units total) into the skin 2 (two) times daily. 10 mL 0  . insulin lispro (HUMALOG) 100 UNIT/ML injection Inject 0.15-0.3 mLs (15-30 Units total) into the skin every evening. Take one unit per 5 carbs depending on dinner. 10 mL 2  . metoCLOPramide (REGLAN) 5 MG tablet Take 1 tablet (5 mg total) by mouth every 8 (eight) hours as needed for nausea or vomiting. (Patient taking differently: Take 5 mg by mouth 2 (two) times daily. ) 30 tablet 0  . metoprolol (LOPRESSOR) 50 MG tablet Take 50 mg by mouth 2 (two) times daily.    . clopidogrel (PLAVIX) 75 MG tablet Take 1 tablet (75 mg total) by mouth daily. (Patient not taking: Reported on 08/17/2016) 90 tablet 3  . pantoprazole (PROTONIX) 40 MG tablet  Take 1 tablet (40 mg total) by mouth daily. 14 tablet 0   No current facility-administered medications on file prior to visit.    Allergies  Allergen Reactions  . Ibuprofen Other (See Comments)    MD told patient not to take due to kidney disease  . Nsaids Other (See Comments)    Told to avoid all nsaids due to kidney disease   . Tape Other (See Comments)    Welts result, if left for a long amount of time   Family History  Problem Relation Age of Onset  . Obesity Mother        Patient states that family members have no other medical illnesses other than what I have described  . Kidney cancer Maternal Grandmother   . Cancer Father 50       AML  . Heart disease Unknown  No family history   Social History   Social History  . Marital status: Significant Other    Spouse name: N/A  . Number of children: N/A  . Years of education: N/A   Occupational History  .      Maintenance [employer]   Social History Main Topics  . Smoking status: Never Smoker  . Smokeless tobacco: Never Used  . Alcohol use 0.0 oz/week     Comment: 06/29/2016 "might have a few drinks/year"  . Drug use: No  . Sexual activity: Yes   Other Topics Concern  . None   Social History Narrative   Patient currently lives with his fiance   Works with facilities   Nonsmoker, nondrinker      Dep[facility] 2/9 08/17/2016 04/27/2016 04/17/2016 05/22/2015 03/11/2015  Decreased Interest 0 0 0 0 0  Down, Depressed, Hopeless 0 0 0 0 0  PHQ - 2 Score 0 0 0 0 0  Altered sleeping - - - - -  Tired, decreased energy - - - - -  Change in appetite - - - - -  Feeling bad or failure about yourself  - - - - -  Trouble concentrating - - - - -  Moving slowly or fidgety/restless - - - - -  Suicidal thoughts - - - - -  PHQ-9 Score - - - - -  Difficult doing work/chores - - - - -    Review of Systems See hpi    Objective:   Physical Exam  Constitutional: He is oriented to person, place, and time. He appears  well-developed and well-nourished. No distress.  HENT:  Head: Normocephalic and atraumatic.  Eyes: Pupils are equal, round, and reactive to light. Conjunctivae are normal. No scleral icterus.  Neck: Normal range of motion. Neck supple. No thyromegaly present.  Cardiovascular: Normal rate, regular rhythm, normal heart sounds and intact distal pulses.   Pulmonary/Chest: Effort normal and breath sounds normal. No respiratory distress.  Musculoskeletal: He exhibits no edema.  Lymphadenopathy:    He has no cervical adenopathy.  Neurological: He is alert and oriented to person, place, and time.  Skin: Skin is warm and dry. He is not diaphoretic.  Psychiatric: He has a normal mood and affect. His behavior is normal.      BP (!) 150/89 (BP Location: Right Arm, Patient Position: Sitting, Cuff Size: Large)   Pulse 93   Temp 97.9 F (36.6 C) (Oral)   Resp 16   Ht 6\' 2"  (1.88 m)   Wt 267 lb 6.4 oz (121.3 kg)   SpO2 97%   BMI 34.33 kg/m     [facility] reading (PRIMARY) by  Dr. Clelia CroftShaw. EKG: NSR; no signs of acute Rt heart strain or acute ischemia. Assessment & Plan:  sudden onset of right lower ext edema and pain with h/o unprovoked dvt, just came off of plavix and no other anticoag, no injury, pedal pulse not palp, + Homan's Well Criteria is high risk with a score of 7.5  Today I have utilized the [place] Controlled Substance Registry's online query to confirm compliance regarding the patient's narcotic pain medications. My review reveals appropriate prescription fills and that [facility] is the sole provider of these medications. Rechecks will occur regularly and the patient is aware of our use of the system.  No narcotic fills in the past 6 months. Patient is on clonazepam 2 mg a day from Dr. Pollie MeyerMcIntyre who which he fills every 6-12 weeks  and received zolpidem 5 mg #30 once from Dr. [name]  He last received a years supply of levothyroxine 15 months prior - so should've been out  3 months ago which is possibly why his TSH is minimally elevated. Restart levothyroxine 50 and recheck TSH in 6-8 weeks.  1. Shortness of breath   2. Edema of right lower extremity   3. Acute pain of right lower extremity   4. Cough   5. History of DVT of lower extremity   6. Chest pain, unspecified type   7. ESRD on dialysis (HCC)   8. Diabetic mononeuropathy associated with type 2 diabetes mellitus (HCC)   9. Chronic respiratory failure with hypoxia (HCC)   10. Malignant hypertension   11. Arteriosclerotic vascular disease   12. Hypothyroidism, unspecified type   13. Unstable angina (HCC)     Orders Placed This Encounter  Procedures  . NM Pulmonary Perf and Vent    Standing Status:   Future    Standing Expiration Date:   10/17/2017    Order Specific Question:   If indicated for the ordered procedure, I authorize the administration of a radiopharmaceutical per Radiology protocol    Answer:   Yes    Comments:   pt with ESRD on dialysis so need to ensure not nephrotoxic    Order Specific Question:   Preferred imaging location?    Answer:   [facility]    Order Specific Question:   Call Results- Best Contact Number?    Answer:   [phone], hold pt    Order Specific Question:   Radiology Contrast Protocol - do NOT remove file path    Answer:   [email or web address]  . DG Chest 2 View    Standing Status:   Future    Standing Expiration Date:   08/17/2017    Order Specific Question:   Reason for Exam (SYMPTOM  OR DIAGNOSIS REQUIRED)    Answer:   suspected PE    Order Specific Question:   Preferred imaging location?    Answer:   External  . Comprehensive metabolic panel  . CBC with Differential/Platelet  . Uric acid  . Sedimentation Rate  . Troponin I  . TSH  . EKG 12-Lead    Meds ordered this encounter  Medications  . clonazePAM (KLONOPIN) 2 MG tablet    Sig: Take 2 mg by mouth daily.  . citalopram (CELEXA) 40 MG tablet    Sig: Take 40  mg by mouth at bedtime as needed.  Marland Kitchen levothyroxine (SYNTHROID, LEVOTHROID) 50 MCG tablet    Sig: Take 1 tablet (50 mcg total) by mouth daily before breakfast.    Dispense:  90 tablet    Refill:  0  . HYDROcodone-acetaminophen (NORCO/VICODIN) 5-325 MG tablet    Sig: Take 1-2 tablets by mouth every 4 (four) hours as needed for moderate pain.    Dispense:  60 tablet    Refill:  0   Over 40 min spent in face-to-face evaluation of and consultation with patient and coordination of care.  Over 50% of this time was spent counseling this patient.  Norberto Sorenson, M.D.  Primary Care at [facility] [address] [place], [place] [address] [phone] phone [phone] fax  08/20/16 10:36 AM

## 2016-08-17 NOTE — Patient Instructions (Addendum)
[facility] for your scheduled [facility] at 4pm. Please arrive early to the admissions department at the main entrance and they will guide you do the radiology department.   You are scheduled for a VQ study and chest xray tomorrow [facility]18/[facility]. Please arrive to the main entrance at 12:30pm where you will be taken for you chest xray at 12:45pm. Your VQ scan will be scheduled for 1pm.      IF you received an x-ray today, you will receive an invoice from [facility]. Please contact [facility] at [phone] with questions or concerns regarding your invoice.   IF you received labwork today, you will receive an invoice from [facility]. Please contact [facility] at [phone] with questions or concerns regarding your invoice.   Our billing staff will not be able to assist you with questions regarding bills from these companies.  You will be contacted with the lab results as soon as they are available. The fastest way to get your results is to activate your My Chart account. Instructions are located on the last page of this paperwork. If you have not heard from us regarding the results in 2 weeks, please contact this office.

## 2016-08-17 NOTE — Progress Notes (Signed)
Cardiology Office Note    Date:  08/19/2016   ID:  Russell BirkenheadSteven B Mcnamara, DOB 04-20-1970, MRN [id number]  PCP:  Shade FloodGreene, Jeffrey R, MD  Cardiologist:  Dr. Jens Somrenshaw  Chief Complaint  Patient presents with  . Follow-up    seen for Dr. Jens Somrenshaw, requested by renal transplant team to obtain stress test and echo    History of Present Illness:  Russell [name] is a 46 y.o. male with PMH of asthma, CAD, h/o DVT, ESRD on dialysis MWF, HTN, hypothyroidism, and DM II. Renal Doppler in May 2016 was normal. He had bilateral DVT around 2 years ago and completed 6 months of Xarelto therapy. Echocardiogram in September 2017 showed normal LV function, moderate LVH. He had a cardiac catheterization in September 2017 showed 90% ramus, 95% first diagonal, 80% proximal LAD and normal LV function. He subsequently had CABG with LIMA to LAD, SVG to diagonal, SVG to obtuse marginal. He was last seen by Dr. Jens Somrenshaw on 01/27/2016, 6 month follow-up was recommended. Dr. Jens Somrenshaw was thinking about discontinuing his Plavix during the next office visit. He was admitted in May for acute on chronic respiratory failure secondary to fluid overload, this was felt to be secondary to non-adherence to his peritoneal dialysis at home. Echocardiogram showed EF 55%, no wall motion abnormality, grade 2 diastolic dysfunction, moderate pulmonary hypertension. CTA was negative for PE. Unfortunately, he returned to the hospital on 05/23/2016 with recurrent chest pain. The chest pain was felt to be atypical and pleuritic. Initial troponin was negative. A limited echo was repeated on 05/17/2016 to check for prior cardial effusion and rule out pericarditis. Echocardiogram obtained on that day showed EF 55%, grade 2 diastolic dysfunction, mild LVH, poorly visualized right ventricle, PA peak pressure 55 mmHg.  [facility] team requested echocardiogram and stress  test. Since he just had a echocardiogram 3 months ago, I will check with renal transplant team to see if they want a repeat echocardiogram, from my perspective, I do not expect any significant change. He does have a very atypical chest pain that is worse with palpation. It is unlikely to be cardiac in nature, however we will [facility] as patient has very poor exercise ability given the request by renal transplant team. We will also discontinue his Plavix. His blood pressure is quite elevated today, 180/96. He did not take his blood pressure medication today, however he says even when he take his blood pressure medication, his blood pressure remained quite elevated even at home. He is currently taking amlodipine only on an as-needed basis when his blood pressure becomes too high. I asked him to take his amlodipine as a more scheduled to drug every day except for Monday, Wednesday, and Friday when he undergo dialysis. Apparently when he go through dialysis, his blood pressure can drop significantly. He can follow-up in 3 month for blood pressure medication titration. He will keep a blood pressure diary during the meantime. In the last few days, he has been having right lower extremity swelling, he also had bilateral burning sensation as well. Symptoms suspicious for possible recurrent DVT. He says he has a office visit with his primary care provider later today will likely will order a venous Doppler. I will hold off on arterial ABI at this time.    Past Medical History:  Diagnosis Date  . Anxiety   . Asthma   . Coronary artery disease  involving native coronary artery of native heart with unstable angina pectoris (HCC)    80% LAD-95% oD1 bifurcation lesion & 90% RI --> referred for CABG  . Daily headache   . Depression   . DVT (deep venous thrombosis), H/o 01/2014-on Xarelto 03/24/2014   LLE  . ESRD (end stage renal disease) on dialysis [facility])    "[place], MWF" (06/29/2016)  . Hypertension    . Hypothyroidism   . Nephrotic syndrome 05/18/2014  . Type 2 diabetes mellitus with diabetic nephropathy [facility])     Past Surgical History:  Procedure Laterality Date  . ANKLE FRACTURE SURGERY Right 1988  . AV FISTULA PLACEMENT Left 01/01/2015   Procedure: CREATION OF LEFT RADIAL CEPHALIC ARTERIOVENOUS (AV) FISTULA ;  Surgeon: Pryor Ochoa, MD;  Location: [facility] OR;  Service: Vascular;  Laterality: Left;  . CAPD REMOVAL N/A 05/19/2016   Procedure: PD CATH REMOVAL;  Surgeon: Abigail Miyamoto, MD;  Location: [facility] OR;  Service: General;  Laterality: N/A;  . CARDIAC CATHETERIZATION N/A 09/22/2015   Procedure: Left Heart Cath and Coronary Angiography;  Surgeon: Iran Ouch, MD;  Location: [facility] INVASIVE CV LAB;  Service: Cardiovascular;  Laterality: N/A;  . CORONARY ARTERY BYPASS GRAFT N/A 09/28/2015   Procedure: CORONARY ARTERY BYPASS GRAFTING (CABG) x 3 UTILIZING LEFT MAMMARY ARTERY AND ENDOSCOPICALLY HARVESTED LEFT GREATER SAPHENOUS VEIN.;  Surgeon: Delight Ovens, MD;  Location: [facility] OR;  Service: Open Heart Surgery;  Laterality: N/A;  . ENDOVEIN HARVEST OF GREATER SAPHENOUS VEIN Left 09/28/2015   Procedure: ENDOVEIN HARVEST OF GREATER SAPHENOUS VEIN;  Surgeon: Delight Ovens, MD;  Location: [facility] OR;  Service: Open Heart Surgery;  Laterality: Left;  . ESOPHAGOGASTRODUODENOSCOPY N/A 03/29/2014   Procedure: ESOPHAGOGASTRODUODENOSCOPY (EGD);  Surgeon: Dorena Cookey, MD;  Location: [facility] ENDOSCOPY;  Service: Endoscopy;  Laterality: N/A;  . EYE SURGERY    . FRACTURE SURGERY    . INSERTION OF DIALYSIS CATHETER Right 01/05/2015   Procedure: INSERTION OF RIGHT INTERNAL JUGULAR DIALYSIS CATHETER;  Surgeon: Fransisco Hertz, MD;  Location: [facility] OR;  Service: Vascular;  Laterality: Right;  . RETINAL LASER PROCEDURE Bilateral   . TEE WITHOUT CARDIOVERSION N/A 09/28/2015   Procedure: TRANSESOPHAGEAL ECHOCARDIOGRAM (TEE);  Surgeon: Delight Ovens, MD;  Location: [facility] OR;  Service: Open Heart Surgery;  Laterality: N/A;  .  TONSILLECTOMY AND ADENOIDECTOMY  1970s    Current Medications: Outpatient Medications Prior to Visit  Medication Sig Dispense Refill  . acetaminophen (TYLENOL) 500 MG tablet Take 1 tablet (500 mg total) by mouth every 6 (six) hours as needed for moderate pain. (Patient taking differently: Take 1,000 mg by mouth every 6 (six) hours as needed (for pain). ) 30 tablet 0  . albuterol (PROVENTIL HFA;VENTOLIN HFA) 108 (90 Base) MCG/ACT inhaler Inhale 1 puff into the lungs every 6 (six) hours as needed for wheezing or shortness of breath. 1 Inhaler 11  . albuterol (PROVENTIL) (2.5 MG/3ML) 0.083% nebulizer solution Take 2.5 mg by nebulization every 6 (six) hours as needed for wheezing.    Marland Kitchen albuterol-ipratropium (COMBIVENT) 18-103 MCG/ACT inhaler Inhale 2 puffs into the lungs every 4 (four) hours. (Patient taking differently: Inhale 2 puffs into the lungs every 6 (six) hours as needed for wheezing or shortness of breath. ) 1 Inhaler 11  . aspirin EC 81 MG EC tablet Take 1 tablet (81 mg total) by mouth daily.    Marland Kitchen atorvastatin (LIPITOR) 80 MG tablet Take 1 tablet (80 mg total) by mouth daily at 6 PM. 90 tablet  1  . calcitRIOL (ROCALTROL) 0.25 MCG capsule Take 1 capsule (0.25 mcg total) by mouth every Monday, Wednesday, and Friday with hemodialysis. (Patient taking differently: Take 0.25 mcg by mouth See admin instructions. Tuesday Thursday Saturday) 30 capsule 3  . calcium acetate (PHOSLO) 667 MG capsule Take 2 capsules (1,334 mg total) by mouth 3 (three) times daily with meals. 180 capsule 0  . clopidogrel (PLAVIX) 75 MG tablet Take 1 tablet (75 mg total) by mouth daily. (Patient not taking: Reported on 08/17/2016) 90 tablet 3  . cyclobenzaprine (FLEXERIL) 10 MG tablet Take 1 tablet (10 mg total) by mouth at bedtime. 30 tablet 11  . gabapentin (NEURONTIN) 100 MG capsule Take 2 capsules (200 mg total) by mouth 3 (three) times daily. (Patient taking differently: Take 800 mg by mouth 2 (two) times daily. ) 180  capsule 11  . hydrOXYzine (ATARAX/VISTARIL) 25 MG tablet Take 25 mg by mouth at bedtime.     . insulin glargine (LANTUS) 100 UNIT/ML injection Inject 0.4 mLs (40 Units total) into the skin 2 (two) times daily. 10 mL 0  . insulin lispro (HUMALOG) 100 UNIT/ML injection Inject 0.15-0.3 mLs (15-30 Units total) into the skin every evening. Take one unit per 5 carbs depending on dinner. 10 mL 2  . metoCLOPramide (REGLAN) 5 MG tablet Take 1 tablet (5 mg total) by mouth every 8 (eight) hours as needed for nausea or vomiting. (Patient taking differently: Take 5 mg by mouth 2 (two) times daily. ) 30 tablet 0  . metoprolol (LOPRESSOR) 50 MG tablet Take 50 mg by mouth 2 (two) times daily.    Marland Kitchen amLODipine (NORVASC) 10 MG tablet Take 1 tablet (10 mg total) by mouth daily. 90 tablet 1  . HYDROmorphone (DILAUDID) 2 MG tablet Take 2 mg by mouth every 4 (four) hours as needed for severe pain.    Marland Kitchen levothyroxine (SYNTHROID, LEVOTHROID) 50 MCG tablet Take 1 tablet (50 mcg total) by mouth daily. 90 tablet 3  . pantoprazole (PROTONIX) 40 MG tablet Take 1 tablet (40 mg total) by mouth daily. 14 tablet 0   No facility-administered medications prior to visit.      Allergies:   Ibuprofen; Nsaids; and Tape   Social History   Social History  . Marital status: Significant Other    Spouse name: N/A  . Number of children: N/A  . Years of education: N/A   Occupational History  .      Maintenance Curator   Social History Main Topics  . Smoking status: Never Smoker  . Smokeless tobacco: Never Used  . Alcohol use 0.0 oz/week     Comment: 06/29/2016 "might have a few drinks/year"  . Drug use: No  . Sexual activity: Yes   Other Topics Concern  . None   Social History Narrative   Patient currently lives with his fiance   Works with facilities   Nonsmoker, nondrinker        Family History:  The patient's family history includes Cancer (age of onset: 59) in his father; Heart disease in his unknown relative;  Kidney cancer in his maternal grandmother; Obesity in his mother.   ROS:   Please see the history of present illness.    ROS All other systems reviewed and are negative.   PHYSICAL EXAM:   VS:  BP (!) 180/96   Pulse 80   Ht 6\' 2"  (1.88 m)   Wt 264 lb (119.7 kg)   BMI 33.90 kg/m    GEN: Well  nourished, well developed, in no acute distress  HEENT: normal  Neck: no JVD, carotid bruits, or masses Cardiac: RRR; no murmurs, rubs, or gallops,no edema  Respiratory:  clear to auscultation bilaterally, normal work of breathing GI: soft, nontender, nondistended, + BS MS: no deformity or atrophy    R leg swelling Skin: warm and dry, no rash Neuro:  Alert and Oriented x 3, Strength and sensation are intact Psych: euthymic mood, full affect  Wt Readings from Last 3 Encounters:  08/17/16 267 lb 6.4 oz (121.3 kg)  08/17/16 264 lb (119.7 kg)  06/30/16 260 lb 5.8 oz (118.1 kg)      Studies/Labs Reviewed:   EKG:  EKG is ordered today.  The ekg ordered today demonstrates Normal sinus rhythm, no significant ST-T wave changes  Recent Labs: 05/23/2016: B Natriuretic Peptide 299.0 06/30/2016: Magnesium 2.4 08/17/2016: ALT 12; BUN 37; Creatinine, Ser 6.85; Hemoglobin 11.2; Platelets 341; Potassium 5.2; Sodium 135; TSH 4.900   Lipid Panel    Component Value Date/Time   CHOL 196 05/17/2016 0208   CHOL 228 (H) 04/17/2016 0900   TRIG 246 (H) 05/17/2016 0208   HDL 32 (L) 05/17/2016 0208   HDL 33 (L) 04/17/2016 0900   CHOLHDL 6.1 05/17/2016 0208   VLDL 49 (H) 05/17/2016 0208   LDLCALC 115 (H) 05/17/2016 0208   LDLCALC 126 (H) 04/17/2016 0900    Additional studies/ records that were reviewed today include:   Cath 09/22/2015 Conclusion     Ramus lesion, 90 %stenosed.  Ost 1st Diag to 1st Diag lesion, 95 %stenosed.  Prox LAD lesion, 80 %stenosed.  The left ventricular systolic function is normal.  LV end diastolic pressure is mildly elevated.  The left ventricular ejection fraction  is 55-65% by visual estimate.   1. Severe two-vessel coronary arte involving a bifurcation proximal LAD/diagonal 1 and proximal large ramus. 2. Normal LV systolic function with mildly elevated LVEDP.   Recommendations: Given that the patient is diabetic with proximal bifurcation LAD involvement, I think the best option is CABG. I consulted CT surgery.  Resume heparin 8 hours after sheath pulled. I'm going to start nitroglycerin drip for blood pressure control and for mild chest pain.      Echo 05/17/2016 LV EF: 55%  Study Conclusions  - Left ventricle: The cavity size was normal. Wall thickness was   increased in a pattern of mild LVH. The estimated ejection   fraction was 55%. Although no diagnostic regional wall motion   abnormality was identified, this possibility cannot be completely   excluded on the basis of this study. Features are consistent with   a pseudonormal left ventricular filling pattern, with concomitant   abnormal relaxation and increased filling pressure (grade 2   diastolic dysfunction). - Aortic valve: There was no stenosis. - Mitral valve: There was no significant regurgitation. - Right ventricle: Poorly visualized. - Tricuspid valve: Peak RV-RA gradient (S): 40 mm Hg. - Pulmonary arteries: PA peak pressure: 55 mm Hg (S). - Systemic veins: IVC measured 2.5 cm with < 50% respirophasic   variation, suggesting RA pressure 15 mmHg.  Impressions:  - Technically difficult study with poor acoustic windows. Normal LV   size with mild LV hypertrophy, EF 55%. Moderate diastolic   dysfunction. RV poorly visualized. No significant valvular   abnormalities. Moderate pulmonary hypertension. Dilated IVC   suggestive of elevated RV filling pressure.    ASSESSMENT:    1. Pre-op testing   2. Chest pain, atypical   3. Essential  hypertension   4. Coronary artery disease involving native coronary artery of native heart without angina pectoris   5. Deep vein  thrombosis (DVT) of proximal lower extremity, unspecified chronicity, unspecified laterality (HCC)   6. Hypothyroidism, unspecified type   7. Controlled type 2 diabetes mellitus without complication, without long-term current use of insulin (HCC)   8. ESRD (end stage renal disease) (HCC)   9. Right leg swelling   10. Pain in both lower extremities      PLAN:  In order of problems listed above:  1. Pre-op testing requested by renal transplant team: Last echocardiogram in May 2018 showed normal EF, unclear if renal transplant team will require a more recent echocardiogram. We do not expect any significant change with repeat. We will check with the renal transplant team. Otherwise, we will plan to obtain a Lexiscan Myovie. He does have occasional atypical chest discomfort, however it is clearly not cardiac in nature.  2. CAD: No obvious angina, stop Plavix.  3. Hypertension: Blood pressure elevated 180/96 today, he did not take his blood pressure medication today. However he says even when he take his blood pressure medications, he is home blood pressures usually in the 140s. I asked him to start taking amlodipine on nondialysis days as a scheduled medication instead of as needed.  4. Hypothyroidism: On Synthroid  5. ESRD: Under evaluation by renal transplant team  6. Bilateral leg pain with right leg swelling: History of DVT, he has upcoming visit with his primary care provider who will likely repeat a lower extremity Doppler. Initially, given bilateral leg pain with walking, I did consider ABI, however more urgently he need to rule out DVT first.    Medication Adjustments/Labs and Tests Ordered: Current medicines are reviewed at length with the patient today.  Concerns regarding medicines are outlined above.  Medication changes, Labs and Tests ordered today are listed in the Patient Instructions below. Patient Instructions  Medication Instructions:  STOP PLAVIX TAKE AMLODIPINE  DAILY-EXCEPT-NOT ON DIALYSIS DAYS If you need a refill on your cardiac medications before your next appointment, please call your pharmacy.  Testing/Procedures: Your physician has requested that you have a lexiscan myoview. For further information please visit [email or web address] Please follow instruction sheet, as given.   Follow-Up: Your physician wants you to follow-up in: 3 MONTHS WITH DR Jens Som   Special Instructions: PLEASE BRING YOUR BP DIARY TO FOLLOW UP VISIT WITH DR Jens Som  Thank you for choosing [facility] at [place], [place]  08/19/2016 8:27 AM    [facility] [address] [place], [place], [place]  [address] Phone: [phone]; Fax: [phone]

## 2016-08-17 NOTE — Progress Notes (Addendum)
**  Preliminary report by tech**  Right lower extremity venous duplex complete. There is no evidence of deep or superficial vein thrombosis involving the right lower extremity. All visualized vessels appear patent and compressible. There is no evidence of a Baker's cyst on the right. Results were faxed to Dr. Alver FisherShaw's office.  08/17/16 3:46 PM Olen CordialGreg Gayle Martinez RVT

## 2016-08-18 ENCOUNTER — Ambulatory Visit (HOSPITAL_COMMUNITY): Payer: Medicare Other

## 2016-08-18 DIAGNOSIS — D631 Anemia in chronic kidney disease: Secondary | ICD-10-CM | POA: Diagnosis not present

## 2016-08-18 DIAGNOSIS — N186 End stage renal disease: Secondary | ICD-10-CM | POA: Diagnosis not present

## 2016-08-18 DIAGNOSIS — N2581 Secondary hyperparathyroidism of renal origin: Secondary | ICD-10-CM | POA: Diagnosis not present

## 2016-08-18 DIAGNOSIS — E211 Secondary hyperparathyroidism, not elsewhere classified: Secondary | ICD-10-CM | POA: Diagnosis not present

## 2016-08-18 LAB — CBC WITH DIFFERENTIAL/PLATELET
BASOS ABS: 0 10*3/uL (ref 0.0–0.2)
Basos: 1 %
EOS (ABSOLUTE): 0.3 10*3/uL (ref 0.0–0.4)
EOS: 5 %
HEMATOCRIT: 35.4 % — AB (ref 37.5–51.0)
Hemoglobin: 11.2 g/dL — ABNORMAL LOW (ref 13.0–17.7)
Immature Grans (Abs): 0 10*3/uL (ref 0.0–0.1)
Immature Granulocytes: 0 %
LYMPHS ABS: 0.8 10*3/uL (ref 0.7–3.1)
Lymphs: 12 %
MCH: 27.5 pg (ref 26.6–33.0)
MCHC: 31.6 g/dL (ref 31.5–35.7)
MCV: 87 fL (ref 79–97)
MONOS ABS: 0.5 10*3/uL (ref 0.1–0.9)
Monocytes: 7 %
Neutrophils Absolute: 5.3 10*3/uL (ref 1.4–7.0)
Neutrophils: 75 %
Platelets: 341 10*3/uL (ref 150–379)
RBC: 4.08 x10E6/uL — AB (ref 4.14–5.80)
RDW: 16.2 % — AB (ref 12.3–15.4)
WBC: 7 10*3/uL (ref 3.4–10.8)

## 2016-08-18 LAB — COMPREHENSIVE METABOLIC PANEL
ALBUMIN: 4.1 g/dL (ref 3.5–5.5)
ALT: 12 IU/L (ref 0–44)
AST: 14 IU/L (ref 0–40)
Albumin/Globulin Ratio: 1.5 (ref 1.2–2.2)
Alkaline Phosphatase: 112 IU/L (ref 39–117)
BILIRUBIN TOTAL: 0.2 mg/dL (ref 0.0–1.2)
BUN / CREAT RATIO: 5 — AB (ref 9–20)
BUN: 37 mg/dL — ABNORMAL HIGH (ref 6–24)
CHLORIDE: 92 mmol/L — AB (ref 96–106)
CO2: 25 mmol/L (ref 20–29)
Calcium: 8.8 mg/dL (ref 8.7–10.2)
Creatinine, Ser: 6.85 mg/dL (ref 0.76–1.27)
GFR calc Af Amer: 10 mL/min/{1.73_m2} — ABNORMAL LOW (ref >59)
GFR calc non Af Amer: 9 mL/min/{1.73_m2} — ABNORMAL LOW (ref >59)
GLOBULIN, TOTAL: 2.8 g/dL (ref 1.5–4.5)
Glucose: 415 mg/dL — ABNORMAL HIGH (ref 65–99)
Potassium: 5.2 mmol/L (ref 3.5–5.2)
SODIUM: 135 mmol/L (ref 134–144)
Total Protein: 6.9 g/dL (ref 6.0–8.5)

## 2016-08-18 LAB — SEDIMENTATION RATE: SED RATE: 22 mm/h — AB (ref 0–15)

## 2016-08-18 LAB — URIC ACID: Uric Acid: 4.8 mg/dL (ref 3.7–8.6)

## 2016-08-18 NOTE — Telephone Encounter (Signed)
Patient states, Dr. Clelia Croft spoke with him last night.

## 2016-08-19 ENCOUNTER — Encounter: Payer: Self-pay | Admitting: Physician Assistant

## 2016-08-20 NOTE — Progress Notes (Signed)
Subjective:    Patient ID: Russell [name], male    DOB: 15-Apr-1970, 46 y.o.   MRN: [id number] Chief Complaint  Patient presents with  . Leg Pain    x 1 week     HPI  Russell [name] is a delightful 46 yo male here today to follow-up acute right leg pain and swelling that started sev wks ago for which I initially saw him 4d prior.  He does have a h/o unprovoked DVT as well as diffuse vascular disease for which he has been on plavix until he was instructed to stop 4d prior by cardiology so I was highly suspicious pt had a recurrent DVT but stat venous dopplers and d-dimer were both negative so pt was asked to RTC today for further eval of his pain.   Last night he could not breath - was up half the night trying to breath. Coughing a ton all night, dry, now hoarse.  + F/C starting yesterday.  His chest has been feeling fine until this morning around 11: oo yesterday and went all hnight long. Gets worse when he lays flat. When he propped himself up will get a little better.  He has the neb machine at [facility] but just moved so not sure where it is.   Past Medical History:  Diagnosis Date  . Anxiety   . Asthma   . 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 Daily headache   . Depression   . DVT (deep venous thrombosis), H/o 01/2014-on Xarelto 03/24/2014   LLE  . ESRD (end stage renal disease) on dialysis [name])    "Russell [name], MWF" (06/29/2016)  . Hypertension   . Hypothyroidism   . Nephrotic syndrome 05/18/2014  . Type 2 diabetes mellitus with diabetic nephropathy [facility])    Past Surgical History:  Procedure Laterality Date  . ANKLE FRACTURE SURGERY Right 1988  . AV FISTULA PLACEMENT Left 01/01/2015   Procedure: CREATION OF LEFT RADIAL CEPHALIC ARTERIOVENOUS (AV) FISTULA ;  Surgeon: Pryor Ochoa, MD;  Location: [facility] OR;  Service: Vascular;  Laterality: Left;  .  CAPD REMOVAL N/A 05/19/2016   Procedure: PD CATH REMOVAL;  Surgeon: Abigail Miyamoto, MD;  Location[facility] OR;  Service: General;  Laterality: N/A;  . CARDIAC CATHETERIZATION N/A 09/22/2015   Procedure: Left Heart Cath and Coronary Angiography;  Surgeon: Iran Ouch, MD;  Location: [facility] INVASIVE CV LAB;  Service: Cardiovascular;  Laterality: N/A;  . CORONARY ARTERY BYPASS GRAFT N/A 09/28/2015   Procedure: CORONARY ARTERY BYPASS GRAFTING (CABG) x 3 UTILIZING LEFT MAMMARY ARTERY AND ENDOSCOPICALLY HARVESTED LEFT GREATER SAPHENOUS VEIN.;  Surgeon: Delight Ovens, MD;  Location: [facility] OR;  Service: Open Heart Surgery;  Laterality: N/A;  . ENDOVEIN HARVEST OF GREATER SAPHENOUS VEIN Left 09/28/2015   Procedure: ENDOVEIN HARVEST OF GREATER SAPHENOUS VEIN;  Surgeon: Delight Ovens, MD;  Location: [facility] OR;  Service: Open Heart Surgery;  Laterality: Left;  . ESOPHAGOGASTRODUODENOSCOPY N/A 03/29/2014   Procedure: ESOPHAGOGASTRODUODENOSCOPY (EGD);  Surgeon: Dorena Cookey, MD;  Location: [facility];  Service: Endoscopy;  Laterality: N/A;  . EYE SURGERY    . FRACTURE SURGERY    . INSERTION OF DIALYSIS CATHETER Right 01/05/2015   Procedure: INSERTION OF RIGHT INTERNAL JUGULAR DIALYSIS CATHETER;  Surgeon: Fransisco Hertz, MD;  Location: [facility] OR;  Service: Vascular;  Laterality: Right;  . RETINAL LASER  PROCEDURE Bilateral   . TEE WITHOUT CARDIOVERSION N/A 09/28/2015   Procedure: TRANSESOPHAGEAL ECHOCARDIOGRAM (TEE);  Surgeon: Delight Ovens, MD;  Location: [facility] OR;  Service: Open Heart Surgery;  Laterality: N/A;  . TONSILLECTOMY AND ADENOIDECTOMY  1970s   Current Outpatient Prescriptions on File Prior to Visit  Medication Sig Dispense Refill  . acetaminophen (TYLENOL) 500 MG tablet Take 1 tablet (500 mg total) by mouth every 6 (six) hours as needed for moderate pain. (Patient taking differently: Take 1,000 mg by mouth every 6 (six) hours as needed (for pain). ) 30 tablet 0  . albuterol (PROVENTIL HFA;VENTOLIN HFA) 108 (90  Base) MCG/ACT inhaler Inhale 1 puff into the lungs every 6 (six) hours as needed for wheezing or shortness of breath. 1 Inhaler 11  . albuterol (PROVENTIL) (2.5 MG/3ML) 0.083% nebulizer solution Take 2.5 mg by nebulization every 6 (six) hours as needed for wheezing.    Marland Kitchen albuterol-ipratropium (COMBIVENT) 18-103 MCG/ACT inhaler Inhale 2 puffs into the lungs every 4 (four) hours. (Patient taking differently: Inhale 2 puffs into the lungs every 6 (six) hours as needed for wheezing or shortness of breath. ) 1 Inhaler 11  . amLODipine (NORVASC) 10 MG tablet Take 1 tablet (10 mg total) by mouth daily. DO NOT TAKE ON DIALYSIS DAYS 90 tablet 1  . aspirin EC 81 MG EC tablet Take 1 tablet (81 mg total) by mouth daily.    Marland Kitchen atorvastatin (LIPITOR) 80 MG tablet Take 1 tablet (80 mg total) by mouth daily at 6 PM. 90 tablet 1  . calcitRIOL (ROCALTROL) 0.25 MCG capsule Take 1 capsule (0.25 mcg total) by mouth every Monday, Wednesday, and Friday with hemodialysis. (Patient taking differently: Take 0.25 mcg by mouth See admin instructions. Tuesday Thursday Saturday) 30 capsule 3  . calcium acetate (PHOSLO) 667 MG capsule Take 2 capsules (1,334 mg total) by mouth 3 (three) times daily with meals. 180 capsule 0  . citalopram (CELEXA) 40 MG tablet Take 40 mg by mouth at bedtime as needed.    . clonazePAM (KLONOPIN) 2 MG tablet Take 2 mg by mouth daily.    . cyclobenzaprine (FLEXERIL) 10 MG tablet Take 1 tablet (10 mg total) by mouth at bedtime. 30 tablet 11  . gabapentin (NEURONTIN) 100 MG capsule Take 2 capsules (200 mg total) by mouth 3 (three) times daily. (Patient taking differently: Take 800 mg by mouth 2 (two) times daily. ) 180 capsule 11  . HYDROcodone-acetaminophen (NORCO/VICODIN) 5-325 MG tablet Take 1-2 tablets by mouth every 4 (four) hours as needed for moderate pain. 60 tablet 0  . hydrOXYzine (ATARAX/VISTARIL) 25 MG tablet Take 25 mg by mouth at bedtime.     . insulin glargine (LANTUS) 100 UNIT/ML injection  Inject 0.4 mLs (40 Units total) into the skin 2 (two) times daily. 10 mL 0  . insulin lispro (HUMALOG) 100 UNIT/ML injection Inject 0.15-0.3 mLs (15-30 Units total) into the skin every evening. Take one unit per 5 carbs depending on dinner. 10 mL 2  . levothyroxine (SYNTHROID, LEVOTHROID) 50 MCG tablet Take 1 tablet (50 mcg total) by mouth daily before breakfast. 90 tablet 0  . metoCLOPramide (REGLAN) 5 MG tablet Take 1 tablet (5 mg total) by mouth every 8 (eight) hours as needed for nausea or vomiting. (Patient taking differently: Take 5 mg by mouth 2 (two) times daily. ) 30 tablet 0  . metoprolol (LOPRESSOR) 50 MG tablet Take 50 mg by mouth 2 (two) times daily.    . clopidogrel (PLAVIX) 75  MG tablet Take 1 tablet (75 mg total) by mouth daily. (Patient not taking: Reported on 08/17/2016) 90 tablet 3  . pantoprazole (PROTONIX) 40 MG tablet Take 1 tablet (40 mg total) by mouth daily. 14 tablet 0   No current facility-administered medications on file prior to visit.    Allergies  Allergen Reactions  . Ibuprofen Other (See Comments)    MD told patient not to take due to kidney disease  . Nsaids Other (See Comments)    Told to avoid all nsaids due to kidney disease   . Tape Other (See Comments)    Welts result, if left for a long amount of time   Family History  Problem Relation Age of Onset  . Obesity Mother        Patient states that family members have no other medical illnesses other than what I have described  . Kidney cancer Maternal Grandmother   . Cancer Father 50       AML  . Heart disease Unknown        No family history   Social History   Social History  . Marital status: Significant Other    Spouse name: N/A  . Number of children: N/A  . Years of education: N/A   Occupational History  .      Maintenance Curator   Social History Main Topics  . Smoking status: Never Smoker  . Smokeless tobacco: Never Used  . Alcohol use 0.0 oz/week     Comment: 06/29/2016 "might have  a few drinks/year"  . Drug use: No  . Sexual activity: Yes   Other Topics Concern  . None   Social History Narrative   Patient currently lives with his fiance   Works with facilities   Nonsmoker, nondrinker      Patient Active Problem List   Diagnosis Date Noted  . History of DVT of lower extremity 08/17/2016  . Arteriosclerotic vascular disease 08/17/2016  . DKA, type 1 (HCC) 06/29/2016  . Nausea vomiting and diarrhea 06/29/2016  . Malignant hypertension 06/29/2016  . Chronic respiratory failure (HCC) 05/16/2016  . Anemia due to end stage renal disease (HCC) 05/16/2016  . S/P CABG (coronary artery bypass graft) 09/28/2015  . ESRD on dialysis (HCC) 02/13/2015  . Nephrotic syndrome 01/03/2015  . Essential hypertension, benign   . Diabetic ketoacidosis without coma associated with type 2 diabetes mellitus (HCC)   . Thyroid activity decreased   . Hx of gastroesophageal reflux (GERD) 09/30/2014  . Diabetic neuropathy (HCC) 09/21/2014  . Uncontrolled diabetes mellitus type 2 with peripheral artery disease (HCC) 09/21/2014  . Morbid obesity (HCC) 09/21/2014  . Chest pain 04/22/2014  . Superficial thrombophlebitis 03/24/2014   Depression screen [facility] 2/9 08/21/2016 08/17/2016 04/27/2016 04/17/2016 05/22/2015  Decreased Interest 0 0 0 0 0  Down, Depressed, Hopeless 0 0 0 0 0  PHQ - 2 Score 0 0 0 0 0  Altered sleeping - - - - -  Tired, decreased energy - - - - -  Change in appetite - - - - -  Feeling bad or failure about yourself  - - - - -  Trouble concentrating - - - - -  Moving slowly or fidgety/restless - - - - -  Suicidal thoughts - - - - -  PHQ-9 Score - - - - -  Difficult doing work/chores - - - - -    Review of Systems See hpi    Objective:   Physical Exam  Constitutional:  He is oriented to person, place, and time. He appears well-developed and well-nourished. He appears ill. No distress.  HENT:  Head: Normocephalic and atraumatic.  Right Ear: Tympanic membrane,  external ear and ear canal normal.  Left Ear: Tympanic membrane, external ear and ear canal normal.  Nose: Nose normal.  Mouth/Throat: Oropharynx is clear and moist and mucous membranes are normal. No oropharyngeal exudate.  Eyes: Conjunctivae are normal. No scleral icterus.  Neck: Normal range of motion. Neck supple. No thyromegaly present.  Cardiovascular: Normal rate, regular rhythm and normal heart sounds.   Pulmonary/Chest: Accessory muscle usage (resolved after alb neb trx) present. No respiratory distress. He has decreased breath sounds. He has wheezes (resolved after alb neb trx in office).  Musculoskeletal: He exhibits edema.  Lymphadenopathy:    He has no cervical adenopathy.  Neurological: He is alert and oriented to person, place, and time.  Skin: Skin is warm. He is not diaphoretic. No erythema.  Psychiatric: He has a normal mood and affect. His behavior is normal.    BP (!) 170/102   Pulse (!) 103   Temp 98 F (36.7 C) (Oral)   Resp 18   Ht 6\' 2"  (1.88 m)   Wt 260 lb (117.9 kg)   SpO2 93%   BMI 33.38 kg/m   O2 sat 94-96% at rest   Dg Chest 2 View  Result Date: 08/21/2016 CLINICAL DATA:  Shortness of breath. EXAM: CHEST  2 VIEW COMPARISON:  05/23/2016 and prior chest radiographs.  07/27/2016 CT FINDINGS: Cardiomegaly and CABG changes again noted. Mild peribronchial thickening is unchanged. There is no evidence of focal airspace disease, pulmonary edema, suspicious pulmonary nodule/mass, pleural effusion, or pneumothorax. No acute bony abnormalities are identified. IMPRESSION: Cardiomegaly without evidence of acute cardiopulmonary disease. Chronic peribronchial thickening. Electronically Signed   By: Harmon Pier M.D.   On: 08/21/2016 14:40   Dg Tibia/fibula Right  Result Date: 08/21/2016 CLINICAL DATA:  Pain and swelling for 3 weeks. EXAM: RIGHT TIBIA AND FIBULA - 2 VIEW COMPARISON:  None. FINDINGS: Chondrocalcinosis is seen in the knee. No bony erosion to suggest  osteomyelitis. A calcification distal to the medial malleolus has a chronic appearance on the ankle films suggesting remote avulsion injury sequela. Vascular calcifications are noted. IMPRESSION: No acute abnormality. Electronically Signed   By: Gerome Sam III M.D   On: 08/21/2016 16:12   Dg Ankle Complete Right  Result Date: 08/21/2016 CLINICAL DATA:  Pain/swelling x3 weeks, no known injury, severe diabetic neuropathy EXAM: RIGHT ANKLE - COMPLETE 3+ VIEW COMPARISON:  None. FINDINGS: No fracture or dislocation is seen. Mild degenerative changes of the tibiotalar joint with fragmentation of the medial malleolus. Mild diffuse soft tissue swelling about the ankle. Vascular calcifications. IMPRESSION: No fracture or dislocation is seen. Mild degenerative changes of the ankle. Electronically Signed   By: Charline Bills M.D.   On: 08/21/2016 16:07   Nm Pet Image Initial (pi) Skull Base To Thigh  Result Date: 07/27/2016 CLINICAL DATA:  Initial treatment strategy for enlarged thoracic lymph nodes. CABG on 09/29/2014 EXAM: NUCLEAR MEDICINE PET SKULL BASE TO THIGH TECHNIQUE: 13.0 mCi F-18 FDG was injected intravenously. Full-ring PET imaging was performed from the skull base to thigh after the radiotracer. CT data was obtained and used for attenuation correction and anatomic localization. FASTING BLOOD GLUCOSE:  Value: 232 mg/dl COMPARISON:  Multiple exams, including CT chest from 05/16/2016 and CT abdomen from 04/27/2016 FINDINGS: NECK No hypermetabolic lymph nodes in the neck. Chronic bilateral maxillary  sinusitis. CHEST Right upper paratracheal node 0.9 cm in short axis on image 58/4 (formerly 1.5 cm), no significant accentuated metabolic activity. At the level of the aortic arch, a separate right paratracheal lymph node measures 1 cm in short axis (formerly 1.8 cm by my measurements), likewise not significantly metabolically active with an SUV of only 2.8. Background mediastinal blood pool activity 4.1.  Currently there is no hypermetabolic adenopathy in the chest. Coronary and aortic arch atherosclerosis. Prior CABG. Mild cardiomegaly. No worrisome pulmonary nodule observed on the CT data. ABDOMEN/PELVIS No abnormal hypermetabolic activity within the liver, pancreas, adrenal glands, or spleen. No hypermetabolic lymph nodes in the abdomen or pelvis. 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 Aortoiliac atherosclerotic vascular disease. Scattered diverticula in the descending colon. SKELETON Chronic accentuated activity along the sternotomy site likely related to none, maximum SUV 5.6. There is some low-grade inflammatory activity wall in the hamstring tendons proximally, left greater than right. IMPRESSION: 1. The adenopathy in the chest is significantly prick improved in the antrum, and is not hypermetabolic. This is felt to be benign and was likely reactive on the prior chest CT. 2. Mild cardiomegaly. Prior CABG. Aortic Atherosclerosis (ICD10-I70.0). 3. Low-grade activity along the sternotomy site where there is lack of bony fusion and bony nonunion. The activity is thought to be likely due to low-grade inflammation related to the bony nonunion. The sternotomy wires remain intact. 4. Incidental horseshoe kidney. Electronically Signed   By: Gaylyn Rong M.D.   On: 07/27/2016 10:37   Dg Foot Complete Right  Result Date: 08/21/2016 CLINICAL DATA:  Acute pain and swelling for 3 weeks. No known injury. EXAM: RIGHT FOOT COMPLETE - 3+ VIEW COMPARISON:  None. FINDINGS: Extensive vascular calcifications are noted. No bony erosions. There is a calcification just proximal to the base of the fifth metatarsal. This likely arises from the base of the fifth metatarsal head a tendinous insertion site and an acute avulsion injury is not excluded. No other fractures are identified. Soft tissue swelling is noted. IMPRESSION: 1. There is a calcification in the soft tissues just proximal to the base of  the fifth metatarsal thought to be sequela of an avulsion injury. The appearance suggests that it may be acute. Recommend clinical correlation. 2. Soft tissue swelling and vascular calcifications. These results will be called to the ordering clinician or representative by the Radiologist Assistant, and communication documented in the PACS or zVision Dashboard. Electronically Signed   By: Gerome Sam III M.D   On: 08/21/2016 16:10    Assessment & Plan:  [facility]   1. Acute pain of right lower extremity   2. Edema of right lower extremity   3. Uncontrolled diabetes mellitus type 2 with peripheral artery disease (HCC)   4. Thrombophlebitis of superficial veins of both lower extremities   5. Malignant hypertension   6. Diabetic mononeuropathy associated with type 2 diabetes mellitus (HCC)   7. ESRD on dialysis (HCC)   8. Morbid obesity (HCC)   9. Chest pain, unspecified type   10. Chronic respiratory failure with hypoxia (HCC)   11. Cough   12. 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 Cont prn hydrocodone.  13. Acute bronchitis, unspecified organism - sxs started acutely, CXR nml but pt at high risk of complications due IDDM and ESRD on dialysis so will cover w/ zpack. Visibly distressed in office w/ sig improvement after alb neb so start neb sched q4hrs while awake x 2d. RTC in 2-3d if not sig improved. To ER if worsens at all.  Hydrocodone will function as cough suppressant.    Orders Placed This Encounter  Procedures  . DG Ankle Complete Right    Standing Status:   Future    Number of Occurrences:   1    Standing Expiration Date:    08/20/2017    Order Specific Question:   Reason for Exam (SYMPTOM  OR DIAGNOSIS REQUIRED)    Answer:   acute pain and and swelling x 3 wks, worsening. NKI but pt w/ severe DM neuropathy    Order Specific Question:   Preferred imaging location?    Answer:   External  . DG Foot Complete Right    Standing Status:   Future    Number of Occurrences:   1    Standing Expiration Date:   08/20/2017    Order Specific Question:   Reason for Exam (SYMPTOM  OR DIAGNOSIS REQUIRED)    Answer:   acute pain and and swelling x 3 wks, worsening. NKI but pt w/ severe DM neuropathy    Order Specific Question:   Preferred imaging location?    Answer:   External  . DG Tibia/Fibula Right    Standing Status:   Future    Number of Occurrences:   1    Standing Expiration Date:   08/20/2017    Order Specific Question:   Reason for Exam (SYMPTOM  OR DIAGNOSIS REQUIRED)    Answer:   acute pain and and swelling x 3 wks, worsening. NKI but pt w/ severe DM neuropathy    Order Specific Question:   Preferred imaging location?    Answer:   External  . Ambulatory referral to Orthopedic Surgery    Referral Priority:   Routine    Referral Type:   Surgical    Referral Reason:   Specialty Services Required    Requested Specialty:   Orthopedic Surgery    Number of Visits Requested:   1    Meds ordered this encounter  Medications  . albuterol (PROVENTIL) (2.5 MG/3ML) 0.083% nebulizer solution 2.5 mg  . ipratropium (ATROVENT) nebulizer solution 0.5 mg  . albuterol (PROVENTIL) (2.5 MG/3ML) 0.083% nebulizer solution    Sig: Take 3 mLs (2.5 mg total) by nebulization every 4 (four) hours as needed for wheezing.    Dispense:  150 mL    Refill:  2  . azithromycin (ZITHROMAX) 250 MG tablet    Sig: Take 2 tabs PO x 1 dose, then 1 tab PO QD x 4 days    Dispense:  6 tablet    Refill:  0    Norberto Sorenson, M.D.  Primary Care at [facility] [address] [place], [place] [address] [phone] phone [phone]  fax  08/23/16 10:57 PM

## 2016-08-21 ENCOUNTER — Ambulatory Visit: Payer: Medicare Other

## 2016-08-21 ENCOUNTER — Ambulatory Visit (INDEPENDENT_AMBULATORY_CARE_PROVIDER_SITE_OTHER): Payer: Medicare Other

## 2016-08-21 ENCOUNTER — Ambulatory Visit (INDEPENDENT_AMBULATORY_CARE_PROVIDER_SITE_OTHER): Payer: Medicare Other | Admitting: Family Medicine

## 2016-08-21 ENCOUNTER — Telehealth: Payer: Self-pay | Admitting: Family Medicine

## 2016-08-21 ENCOUNTER — Other Ambulatory Visit: Payer: Self-pay | Admitting: Radiology

## 2016-08-21 ENCOUNTER — Encounter: Payer: Self-pay | Admitting: Family Medicine

## 2016-08-21 VITALS — BP 170/102 | HR 103 | Temp 98.0°F | Resp 18 | Ht 74.0 in | Wt 260.0 lb

## 2016-08-21 DIAGNOSIS — S92301G Fracture of unspecified metatarsal bone(s), right foot, subsequent encounter for fracture with delayed healing: Secondary | ICD-10-CM | POA: Diagnosis not present

## 2016-08-21 DIAGNOSIS — IMO0002 Reserved for concepts with insufficient information to code with codable children: Secondary | ICD-10-CM

## 2016-08-21 DIAGNOSIS — E1151 Type 2 diabetes mellitus with diabetic peripheral angiopathy without gangrene: Secondary | ICD-10-CM | POA: Diagnosis not present

## 2016-08-21 DIAGNOSIS — Z992 Dependence on renal dialysis: Secondary | ICD-10-CM

## 2016-08-21 DIAGNOSIS — E1141 Type 2 diabetes mellitus with diabetic mononeuropathy: Secondary | ICD-10-CM

## 2016-08-21 DIAGNOSIS — I8003 Phlebitis and thrombophlebitis of superficial vessels of lower extremities, bilateral: Secondary | ICD-10-CM

## 2016-08-21 DIAGNOSIS — I200 Unstable angina: Secondary | ICD-10-CM

## 2016-08-21 DIAGNOSIS — N186 End stage renal disease: Secondary | ICD-10-CM

## 2016-08-21 DIAGNOSIS — R600 Localized edema: Secondary | ICD-10-CM

## 2016-08-21 DIAGNOSIS — M79604 Pain in right leg: Secondary | ICD-10-CM | POA: Diagnosis not present

## 2016-08-21 DIAGNOSIS — R0602 Shortness of breath: Secondary | ICD-10-CM | POA: Diagnosis not present

## 2016-08-21 DIAGNOSIS — R05 Cough: Secondary | ICD-10-CM

## 2016-08-21 DIAGNOSIS — J90 Pleural effusion, not elsewhere classified: Secondary | ICD-10-CM | POA: Diagnosis not present

## 2016-08-21 DIAGNOSIS — N2581 Secondary hyperparathyroidism of renal origin: Secondary | ICD-10-CM | POA: Diagnosis not present

## 2016-08-21 DIAGNOSIS — M7989 Other specified soft tissue disorders: Secondary | ICD-10-CM | POA: Diagnosis not present

## 2016-08-21 DIAGNOSIS — I10 Essential (primary) hypertension: Secondary | ICD-10-CM

## 2016-08-21 DIAGNOSIS — E1165 Type 2 diabetes mellitus with hyperglycemia: Secondary | ICD-10-CM

## 2016-08-21 DIAGNOSIS — J209 Acute bronchitis, unspecified: Secondary | ICD-10-CM

## 2016-08-21 DIAGNOSIS — J9611 Chronic respiratory failure with hypoxia: Secondary | ICD-10-CM

## 2016-08-21 DIAGNOSIS — R059 Cough, unspecified: Secondary | ICD-10-CM

## 2016-08-21 DIAGNOSIS — R079 Chest pain, unspecified: Secondary | ICD-10-CM

## 2016-08-21 DIAGNOSIS — E211 Secondary hyperparathyroidism, not elsewhere classified: Secondary | ICD-10-CM | POA: Diagnosis not present

## 2016-08-21 DIAGNOSIS — M79661 Pain in right lower leg: Secondary | ICD-10-CM | POA: Diagnosis not present

## 2016-08-21 DIAGNOSIS — D631 Anemia in chronic kidney disease: Secondary | ICD-10-CM | POA: Diagnosis not present

## 2016-08-21 MED ORDER — IPRATROPIUM BROMIDE 0.02 % IN SOLN
0.5000 mg | Freq: Once | RESPIRATORY_TRACT | Status: AC
  Administered 2016-08-21: 0.5 mg via RESPIRATORY_TRACT

## 2016-08-21 MED ORDER — ALBUTEROL SULFATE (2.5 MG/3ML) 0.083% IN NEBU: 2.5000 mg | INHALATION_SOLUTION | RESPIRATORY_TRACT | 2 refills | Status: DC | PRN

## 2016-08-21 MED ORDER — AZITHROMYCIN 250 MG PO TABS: ORAL_TABLET | ORAL | 0 refills | Status: DC

## 2016-08-21 MED ORDER — ALBUTEROL SULFATE (2.5 MG/3ML) 0.083% IN NEBU
2.5000 mg | INHALATION_SOLUTION | Freq: Once | RESPIRATORY_TRACT | Status: AC
  Administered 2016-08-21: 2.5 mg via RESPIRATORY_TRACT

## 2016-08-21 NOTE — Telephone Encounter (Signed)
[facility] radiology is calling to give a report.  Please advise [phone]

## 2016-08-21 NOTE — Patient Instructions (Addendum)
Use the albuterol nebulizer every 4 hours for the next 2 days while you're awake. It is okay to use the hydrocodone pain medicine in combination with the Tessalon for cough suppressants.  Start the azithromycin.  If you are not significantly improved in 2 days, please recheck with me on Thursday.  If you can't find your nebulizer machine tonight, please call me  [phone], then the answering service will page me). so we can get set up for you to have one delivered first thing in the morning.   IF you received an x-ray today, you will receive an invoice from [facility]. Please contact [facility] at [phone] with questions or concerns regarding your invoice.   IF you received labwork today, you will receive an invoice from [facility]. Please contact [facility] at [phone] with questions or concerns regarding your invoice.   Our billing staff will not be able to assist you with questions regarding bills from these companies.  You will be contacted with the lab results as soon as they are available. The fastest way to get your results is to activate your My Chart account. Instructions are located on the last page of this paperwork. If you have not heard from [place] regarding the results in 2 weeks, please contact this office.    Avulsion Fracture of the Foot An avulsion fracture of the foot is when a piece of bone in your foot has been torn away. Bones are connected to other bones by strong bands of connective tissue (ligaments). Muscles are also connected to bones with connective tissue (tendons). Avulsion fractures occur when severe stress on a bone from a ligament or tendon causes a small piece of bone to be pulled away. Athletes may develop an avulsion fracture of the foot gradually (chronic avulsion fracture). The heel bone and the long bone in the foot that connect to the fifth toe (fifth metatarsal bone) are common areas of avulsion fracture of the foot. What are the  causes? An avulsion fracture of the foot can be caused by a sudden or repetitive twisting of your foot or ankle. It can also occur during a fall from a standing height. What increases the risk? You may have a higher risk of an avulsion fracture of the foot if you:  Participate in activities during which twisting the ankle or foot are likely, such as: ? Dancing. ? Track and field. ? Walking or hiking on uneven surfaces.  Have had diabetes for many years.  Have osteoporosis.  What are the signs or symptoms? The most common symptom of an avulsion fracture of the foot is intense pain at the time of injury. You may also feel a pop or tearing. The pain continues after the injury. Other signs and symptoms may include:  Swelling.  Bruising.  Pain with movement or weight bearing.  Difficulty walking.  Pain when pressure is applied to the injured area.  Warmth over the injured area.  How is this diagnosed? An avulsion fracture of the foot may be diagnosed by:  History. Your health care provider will ask you what occurred during the time of your injury and whether you had any pain in the area before your injury.  Physical exam. During the exam, your health care provider may try to move your foot, toes, and ankle to check for pain and level of mobility.  X-ray. This will show if any bones are fractured or out of place.  MRI. This will show your tendons and ligaments. Some avulsion  fractures are associated with an injury to a tendon or ligament.  How is this treated? Treatment for an avulsion fracture of the foot depends on the size of the displaced piece of bone and how far it has been pulled out of place. Small avulsion fractures may be treated with rest and support in a cast or brace. Large fragments of bone usually need to be reattached surgically. Treatment of these fractures may also require physical therapy to regain full use of your foot. Treatments may include:  Rest, ice,  compression, and elevations (RICE treatment) as directed by your health care provider.  Medicines that reduce pain and swelling (NSAIDs).  Wearing a splint, elastic wrap, support boot, or cast as directed by your health care provider.  Crutches or a rolling scooter to support your body weight until your foot heals.  Surgery to reattach the bone and tendon or ligament.  Physical therapy. This may last for several months.  Follow these instructions at home:  Take medicines only as directed by your health care provider.  Rest your foot until your health care provider says you can resume activity.  Keep your foot raised above the level of your heart when you are sitting or lying down.  Apply ice to the injured area: ? Put ice in a plastic bag. ? Place a towel between your skin and the bag. ? Leave the ice on for 20 minutes, 2-3 times a day.  Do not allow your cast or splint to get wet as directed by your health care provider.  Keep all follow-up visits as directed by your health care provider. This is important. Contact a health care provider if:  Your pain gets worse.  You have chills or fever.  Your cast or splint is damaged.  The cast has a bad odor or has stains caused by fluids from the wound. Get help right away if:  Your foot is cold, blue, or pale.  You have pain, swelling, redness, or numbness below your cast or splint. This information is not intended to replace advice given to you by your health care provider. Make sure you discuss any questions you have with your health care provider. Document Released: 07/16/2013 Document Revised: 05/27/2015 Document Reviewed: 03/14/2013 Elsevier Interactive Patient Education  2018 [employer].  Acute Bronchitis, Adult Acute bronchitis is sudden (acute) swelling of the air tubes (bronchi) in the lungs. Acute bronchitis causes these tubes to fill with mucus, which can make it hard to breathe. It can also cause coughing or  wheezing. In adults, acute bronchitis usually goes away within 2 weeks. A cough caused by bronchitis may last up to 3 weeks. Smoking, allergies, and asthma can make the condition worse. Repeated episodes of bronchitis may cause further lung problems, such as chronic obstructive pulmonary disease (COPD). What are the causes? This condition can be caused by germs and by substances that irritate the lungs, including:  Cold and flu viruses. This condition is most often caused by the same virus that causes a cold.  Bacteria.  Exposure to tobacco smoke, dust, fumes, and air pollution.  What increases the risk? This condition is more likely to develop in people who:  Have close contact with someone with acute bronchitis.  Are exposed to lung irritants, such as tobacco smoke, dust, fumes, and vapors.  Have a weak immune system.  Have a respiratory condition such as asthma.  What are the signs or symptoms? Symptoms of this condition include:  A cough.  Coughing  up clear, yellow, or green mucus.  Wheezing.  Chest congestion.  Shortness of breath.  A fever.  Body aches.  Chills.  A sore throat.  How is this diagnosed? This condition is usually diagnosed with a physical exam. During the exam, your health care provider may order tests, such as chest X-rays, to rule out other conditions. He or she may also:  Test a sample of your mucus for bacterial infection.  Check the level of oxygen in your blood. This is done to check for pneumonia.  Do a chest X-ray or lung function testing to rule out pneumonia and other conditions.  Perform blood tests.  Your health care provider will also ask about your symptoms and medical history. How is this treated? Most cases of acute bronchitis clear up over time without treatment. Your health care provider may recommend:  Drinking more fluids. Drinking more makes your mucus thinner, which may make it easier to breathe.  Taking a medicine  for a fever or cough.  Taking an antibiotic medicine.  Using an inhaler to help improve shortness of breath and to control a cough.  Using a cool mist vaporizer or humidifier to make it easier to breathe.  Follow these instructions at home: Medicines  Take over-the-counter and prescription medicines only as told by your health care provider.  If you were prescribed an antibiotic, take it as told by your health care provider. Do not stop taking the antibiotic even if you start to feel better. General instructions  Get plenty of rest.  Drink enough fluids to keep your urine clear or pale yellow.  Avoid smoking and secondhand smoke. Exposure to cigarette smoke or irritating chemicals will make bronchitis worse. If you smoke and you need help quitting, ask your health care provider. Quitting smoking will help your lungs heal faster.  Use an inhaler, cool mist vaporizer, or humidifier as told by your health care provider.  Keep all follow-up visits as told by your health care provider. This is important. How is this prevented? To lower your risk of getting this condition again:  Wash your hands often with soap and water. If soap and water are not available, use hand sanitizer.  Avoid contact with people who have cold symptoms.  Try not to touch your hands to your mouth, nose, or eyes.  Make sure to get the flu shot every year.  Contact a health care provider if:  Your symptoms do not improve in 2 weeks of treatment. Get help right away if:  You cough up blood.  You have chest pain.  You have severe shortness of breath.  You become dehydrated.  You faint or keep feeling like you are going to faint.  You keep vomiting.  You have a severe headache.  Your fever or chills gets worse. This information is not intended to replace advice given to you by your health care provider. Make sure you discuss any questions you have with your health care provider. Document Released:  01/27/2004 Document Revised: 07/14/2015 Document Reviewed: 06/09/2015 Elsevier Interactive Patient Education  2017 [employer].

## 2016-08-22 ENCOUNTER — Telehealth: Payer: Self-pay | Admitting: Emergency Medicine

## 2016-08-22 ENCOUNTER — Telehealth: Payer: Self-pay | Admitting: Family Medicine

## 2016-08-22 ENCOUNTER — Other Ambulatory Visit: Payer: Self-pay | Admitting: Emergency Medicine

## 2016-08-22 MED ORDER — AZITHROMYCIN 250 MG PO TABS: ORAL_TABLET | ORAL | 0 refills | Status: DC

## 2016-08-22 MED ORDER — ALBUTEROL SULFATE (2.5 MG/3ML) 0.083% IN NEBU: 2.5000 mg | INHALATION_SOLUTION | RESPIRATORY_TRACT | 2 refills | Status: DC | PRN

## 2016-08-22 NOTE — Telephone Encounter (Signed)
Right foot xray report received. Results routed to Dr. Clelia Croft

## 2016-08-22 NOTE — Telephone Encounter (Signed)
Received results. Will route to Dr. Clelia Croft

## 2016-08-22 NOTE — Telephone Encounter (Signed)
Medication resent to correct pharmacy and updated in chart. Unable to leave message vm full. Please let patient know, changes done.

## 2016-08-22 NOTE — Telephone Encounter (Signed)
PATIENT SAW DR. SHAW ON Monday (08/21/16) AND SHE PRESCRIBED HIM TO HAVE 2 MEDICATIONS THAT WERE CALLED INTO THE WRONG PHARMACY. [facility] NOT [facility] IN [place]. PLEASE CALL THEM IN AS SOON AS POSSIBLE SO HE CAN PICK THEM UP. BEST PHONE [phone] (CELL) HE WOULD LIKE A CALL TO LET HIM KNOW IT HAS BEEN DONE. MBC

## 2016-08-22 NOTE — Telephone Encounter (Signed)
[facility] CALLING TO GET [facility]E FOR PATIENTS INHALER

## 2016-08-23 ENCOUNTER — Other Ambulatory Visit: Payer: Self-pay | Admitting: Emergency Medicine

## 2016-08-23 DIAGNOSIS — J209 Acute bronchitis, unspecified: Secondary | ICD-10-CM

## 2016-08-23 DIAGNOSIS — E211 Secondary hyperparathyroidism, not elsewhere classified: Secondary | ICD-10-CM | POA: Diagnosis not present

## 2016-08-23 DIAGNOSIS — M79671 Pain in right foot: Secondary | ICD-10-CM | POA: Diagnosis not present

## 2016-08-23 DIAGNOSIS — N186 End stage renal disease: Secondary | ICD-10-CM | POA: Diagnosis not present

## 2016-08-23 DIAGNOSIS — D631 Anemia in chronic kidney disease: Secondary | ICD-10-CM | POA: Diagnosis not present

## 2016-08-23 DIAGNOSIS — N2581 Secondary hyperparathyroidism of renal origin: Secondary | ICD-10-CM | POA: Diagnosis not present

## 2016-08-23 DIAGNOSIS — J9611 Chronic respiratory failure with hypoxia: Secondary | ICD-10-CM

## 2016-08-23 MED ORDER — ALBUTEROL SULFATE (2.5 MG/3ML) 0.083% IN NEBU: 2.5000 mg | INHALATION_SOLUTION | RESPIRATORY_TRACT | 2 refills | Status: DC | PRN

## 2016-08-23 NOTE — Telephone Encounter (Signed)
Diagnosis code added to script. Resent to pharmacy

## 2016-08-24 ENCOUNTER — Telehealth (HOSPITAL_COMMUNITY): Payer: Self-pay

## 2016-08-24 NOTE — Telephone Encounter (Signed)
Encounter complete. 

## 2016-08-24 NOTE — Telephone Encounter (Signed)
Saw and addressed during office visit 3 days ago with referral placed.

## 2016-08-24 NOTE — Telephone Encounter (Signed)
Saw and addressed during office visit 3d ago

## 2016-08-25 DIAGNOSIS — N186 End stage renal disease: Secondary | ICD-10-CM | POA: Diagnosis not present

## 2016-08-25 DIAGNOSIS — N2581 Secondary hyperparathyroidism of renal origin: Secondary | ICD-10-CM | POA: Diagnosis not present

## 2016-08-25 DIAGNOSIS — D631 Anemia in chronic kidney disease: Secondary | ICD-10-CM | POA: Diagnosis not present

## 2016-08-25 DIAGNOSIS — E211 Secondary hyperparathyroidism, not elsewhere classified: Secondary | ICD-10-CM | POA: Diagnosis not present

## 2016-08-28 DIAGNOSIS — N2581 Secondary hyperparathyroidism of renal origin: Secondary | ICD-10-CM | POA: Diagnosis not present

## 2016-08-28 DIAGNOSIS — N186 End stage renal disease: Secondary | ICD-10-CM | POA: Diagnosis not present

## 2016-08-28 DIAGNOSIS — E211 Secondary hyperparathyroidism, not elsewhere classified: Secondary | ICD-10-CM | POA: Diagnosis not present

## 2016-08-28 DIAGNOSIS — D631 Anemia in chronic kidney disease: Secondary | ICD-10-CM | POA: Diagnosis not present

## 2016-08-29 ENCOUNTER — Telehealth (HOSPITAL_COMMUNITY): Payer: Self-pay

## 2016-08-29 ENCOUNTER — Inpatient Hospital Stay (HOSPITAL_COMMUNITY): Admission: RE | Admit: 2016-08-29 | Payer: Medicare Other | Source: Ambulatory Visit

## 2016-08-29 NOTE — Telephone Encounter (Signed)
Encounter complete. 

## 2016-08-29 NOTE — Telephone Encounter (Signed)
PA for Albuterol Sulfate through cover my meds.  Key code is [id number].  Will take 1-3 days for response.

## 2016-08-30 DIAGNOSIS — N2581 Secondary hyperparathyroidism of renal origin: Secondary | ICD-10-CM | POA: Diagnosis not present

## 2016-08-30 DIAGNOSIS — D631 Anemia in chronic kidney disease: Secondary | ICD-10-CM | POA: Diagnosis not present

## 2016-08-30 DIAGNOSIS — E211 Secondary hyperparathyroidism, not elsewhere classified: Secondary | ICD-10-CM | POA: Diagnosis not present

## 2016-08-30 DIAGNOSIS — N186 End stage renal disease: Secondary | ICD-10-CM | POA: Diagnosis not present

## 2016-08-30 NOTE — Telephone Encounter (Signed)
Received denial through cover my meds.  Called pharmacy and they said to disregard because they had sent request in error.

## 2016-08-31 ENCOUNTER — Ambulatory Visit (HOSPITAL_COMMUNITY)
Admission: RE | Admit: 2016-08-31 | Discharge: 2016-08-31 | Disposition: A | Discharge status: Home / Self Care | Payer: Medicare Other | Source: Ambulatory Visit | Attending: Cardiology | Admitting: Cardiology

## 2016-08-31 DIAGNOSIS — R5383 Other fatigue: Secondary | ICD-10-CM | POA: Diagnosis not present

## 2016-08-31 DIAGNOSIS — J45909 Unspecified asthma, uncomplicated: Secondary | ICD-10-CM | POA: Diagnosis not present

## 2016-08-31 DIAGNOSIS — E669 Obesity, unspecified: Secondary | ICD-10-CM | POA: Insufficient documentation

## 2016-08-31 DIAGNOSIS — Z951 Presence of aortocoronary bypass graft: Secondary | ICD-10-CM | POA: Insufficient documentation

## 2016-08-31 DIAGNOSIS — I2510 Atherosclerotic heart disease of native coronary artery without angina pectoris: Secondary | ICD-10-CM | POA: Diagnosis not present

## 2016-08-31 DIAGNOSIS — Z8249 Family history of ischemic heart disease and other diseases of the circulatory system: Secondary | ICD-10-CM | POA: Insufficient documentation

## 2016-08-31 DIAGNOSIS — R0789 Other chest pain: Secondary | ICD-10-CM | POA: Diagnosis not present

## 2016-08-31 DIAGNOSIS — N186 End stage renal disease: Secondary | ICD-10-CM | POA: Diagnosis not present

## 2016-08-31 DIAGNOSIS — E079 Disorder of thyroid, unspecified: Secondary | ICD-10-CM | POA: Insufficient documentation

## 2016-08-31 DIAGNOSIS — Z01818 Encounter for other preprocedural examination: Secondary | ICD-10-CM | POA: Diagnosis not present

## 2016-08-31 DIAGNOSIS — E1122 Type 2 diabetes mellitus with diabetic chronic kidney disease: Secondary | ICD-10-CM | POA: Insufficient documentation

## 2016-08-31 DIAGNOSIS — I120 Hypertensive chronic kidney disease with stage 5 chronic kidney disease or end stage renal disease: Secondary | ICD-10-CM | POA: Diagnosis not present

## 2016-08-31 DIAGNOSIS — R0602 Shortness of breath: Secondary | ICD-10-CM | POA: Diagnosis not present

## 2016-08-31 DIAGNOSIS — R0609 Other forms of dyspnea: Secondary | ICD-10-CM | POA: Diagnosis not present

## 2016-08-31 DIAGNOSIS — I252 Old myocardial infarction: Secondary | ICD-10-CM | POA: Diagnosis not present

## 2016-08-31 DIAGNOSIS — Z6834 Body mass index (BMI) 34.0-34.9, adult: Secondary | ICD-10-CM | POA: Diagnosis not present

## 2016-08-31 LAB — MYOCARDIAL PERFUSION IMAGING
CHL CUP RESTING HR STRESS: 77 {beats}/min
CSEPPHR: 88 {beats}/min
LV sys vol: 158 mL
LVDIAVOL: 92 mL (ref 62–150)
SDS: 2
SRS: 5
SSS: 7
TID: 1.19

## 2016-08-31 MED ORDER — TECHNETIUM TC 99M TETROFOSMIN IV KIT
10.1000 | PACK | Freq: Once | INTRAVENOUS | Status: AC | PRN
  Administered 2016-08-31: 10.1 via INTRAVENOUS
  Filled 2016-08-31: qty 11

## 2016-08-31 MED ORDER — REGADENOSON 0.4 MG/5ML IV SOLN
0.4000 mg | Freq: Once | INTRAVENOUS | Status: AC
  Administered 2016-08-31: 0.4 mg via INTRAVENOUS

## 2016-08-31 MED ORDER — TECHNETIUM TC 99M TETROFOSMIN IV KIT
31.2000 | PACK | Freq: Once | INTRAVENOUS | Status: AC | PRN
  Administered 2016-08-31: 31.2 via INTRAVENOUS
  Filled 2016-08-31: qty 32

## 2016-09-01 ENCOUNTER — Telehealth: Payer: Self-pay | Admitting: *Deleted

## 2016-09-01 DIAGNOSIS — D631 Anemia in chronic kidney disease: Secondary | ICD-10-CM | POA: Diagnosis not present

## 2016-09-01 DIAGNOSIS — N186 End stage renal disease: Secondary | ICD-10-CM | POA: Diagnosis not present

## 2016-09-01 DIAGNOSIS — E211 Secondary hyperparathyroidism, not elsewhere classified: Secondary | ICD-10-CM | POA: Diagnosis not present

## 2016-09-01 DIAGNOSIS — N2581 Secondary hyperparathyroidism of renal origin: Secondary | ICD-10-CM | POA: Diagnosis not present

## 2016-09-01 DIAGNOSIS — Z992 Dependence on renal dialysis: Secondary | ICD-10-CM | POA: Diagnosis not present

## 2016-09-01 DIAGNOSIS — R9439 Abnormal result of other cardiovascular function study: Secondary | ICD-10-CM

## 2016-09-01 DIAGNOSIS — R072 Precordial pain: Secondary | ICD-10-CM

## 2016-09-01 DIAGNOSIS — N049 Nephrotic syndrome with unspecified morphologic changes: Secondary | ICD-10-CM | POA: Diagnosis not present

## 2016-09-01 NOTE — Telephone Encounter (Addendum)
-----   Message from Lewayne BuntingBrian S Crenshaw, MD sent at 08/31/2016  2:43 PM EDT ----- No ischemia; EF likely inaccurate but will need to schedule echo to confirm EF as he is being evaluated for renal transplant. Olga MillersBrian Crenshaw   Spoke with pt, aware of test results. Echo scheduled.

## 2016-09-04 DIAGNOSIS — D631 Anemia in chronic kidney disease: Secondary | ICD-10-CM | POA: Diagnosis not present

## 2016-09-04 DIAGNOSIS — E211 Secondary hyperparathyroidism, not elsewhere classified: Secondary | ICD-10-CM | POA: Diagnosis not present

## 2016-09-04 DIAGNOSIS — N2581 Secondary hyperparathyroidism of renal origin: Secondary | ICD-10-CM | POA: Diagnosis not present

## 2016-09-04 DIAGNOSIS — Z23 Encounter for immunization: Secondary | ICD-10-CM | POA: Diagnosis not present

## 2016-09-04 DIAGNOSIS — N186 End stage renal disease: Secondary | ICD-10-CM | POA: Diagnosis not present

## 2016-09-06 ENCOUNTER — Telehealth: Payer: Self-pay | Admitting: Cardiology

## 2016-09-06 DIAGNOSIS — N2581 Secondary hyperparathyroidism of renal origin: Secondary | ICD-10-CM | POA: Diagnosis not present

## 2016-09-06 DIAGNOSIS — N186 End stage renal disease: Secondary | ICD-10-CM | POA: Diagnosis not present

## 2016-09-06 DIAGNOSIS — E211 Secondary hyperparathyroidism, not elsewhere classified: Secondary | ICD-10-CM | POA: Diagnosis not present

## 2016-09-06 DIAGNOSIS — Z23 Encounter for immunization: Secondary | ICD-10-CM | POA: Diagnosis not present

## 2016-09-06 DIAGNOSIS — D631 Anemia in chronic kidney disease: Secondary | ICD-10-CM | POA: Diagnosis not present

## 2016-09-06 MED ORDER — METOPROLOL TARTRATE 50 MG PO TABS: 100.0000 mg | ORAL_TABLET | Freq: Two times a day (BID) | ORAL | Status: DC

## 2016-09-06 NOTE — Telephone Encounter (Signed)
Patient called in from dialysis. He stated that for the last several weeks his blood pressures have been running high.   Monday: 227/144 Tuesday: 191/103 Today: 181/103 with a heart rate of 103  He is symptomatic with headaches but denies any other symptoms.  At his last office visit with Azalee CourseHao Meng, PA, amlodipine 10 mg was started. The patient wanted to come in for an appointment today and was offered a 2:00 appointment with one of the PA's. He refused the appointment.  Will route to the provider for his recommendations.

## 2016-09-06 NOTE — Telephone Encounter (Signed)
Increase metoprolol from 50 BID to 100 mg BID and PAOV Olga MillersBrian Crenshaw

## 2016-09-06 NOTE — Telephone Encounter (Signed)
Spoke with pt, Aware of dr crenshaw's recommendations.  Follow up scheduled  

## 2016-09-06 NOTE — Telephone Encounter (Signed)
New message    Pt is calling. He is at dialysis now.   Pt c/o BP issue: STAT if pt c/o blurred vision, one-sided weakness or slurred speech  1. What are your last 5 BP readings? 191/103 last night, 181/103 this morning  2. Are you having any other symptoms (ex. Dizziness, headache, blurred vision, passed out)? headache  3. What is your BP issue? Pt BP is high.

## 2016-09-06 NOTE — Telephone Encounter (Signed)
Follow Up   Pt is requesting a call back. States he has not heard from anyone about this and is concerned

## 2016-09-08 DIAGNOSIS — N2581 Secondary hyperparathyroidism of renal origin: Secondary | ICD-10-CM | POA: Diagnosis not present

## 2016-09-08 DIAGNOSIS — N186 End stage renal disease: Secondary | ICD-10-CM | POA: Diagnosis not present

## 2016-09-08 DIAGNOSIS — D631 Anemia in chronic kidney disease: Secondary | ICD-10-CM | POA: Diagnosis not present

## 2016-09-08 DIAGNOSIS — E211 Secondary hyperparathyroidism, not elsewhere classified: Secondary | ICD-10-CM | POA: Diagnosis not present

## 2016-09-08 DIAGNOSIS — Z23 Encounter for immunization: Secondary | ICD-10-CM | POA: Diagnosis not present

## 2016-09-09 ENCOUNTER — Telehealth: Payer: Self-pay

## 2016-09-09 DIAGNOSIS — J9611 Chronic respiratory failure with hypoxia: Secondary | ICD-10-CM

## 2016-09-09 DIAGNOSIS — J209 Acute bronchitis, unspecified: Secondary | ICD-10-CM

## 2016-09-09 NOTE — Telephone Encounter (Signed)
Not sure how that is possible. Albuterol nebs are generic and very cheap. Was it ordered wrong? Is there a quantity limit? Does he need to get it through a DME company?

## 2016-09-09 NOTE — Telephone Encounter (Signed)
Received a fax from [facility] that Albuterol Sulfate was denied.

## 2016-09-11 ENCOUNTER — Other Ambulatory Visit (HOSPITAL_COMMUNITY): Payer: Medicare Other

## 2016-09-11 DIAGNOSIS — N2581 Secondary hyperparathyroidism of renal origin: Secondary | ICD-10-CM | POA: Diagnosis not present

## 2016-09-11 DIAGNOSIS — E211 Secondary hyperparathyroidism, not elsewhere classified: Secondary | ICD-10-CM | POA: Diagnosis not present

## 2016-09-11 DIAGNOSIS — N186 End stage renal disease: Secondary | ICD-10-CM | POA: Diagnosis not present

## 2016-09-11 DIAGNOSIS — Z23 Encounter for immunization: Secondary | ICD-10-CM | POA: Diagnosis not present

## 2016-09-11 DIAGNOSIS — D631 Anemia in chronic kidney disease: Secondary | ICD-10-CM | POA: Diagnosis not present

## 2016-09-12 ENCOUNTER — Encounter: Payer: Self-pay | Admitting: Physician Assistant

## 2016-09-12 ENCOUNTER — Ambulatory Visit (INDEPENDENT_AMBULATORY_CARE_PROVIDER_SITE_OTHER): Payer: Medicare Other | Admitting: Physician Assistant

## 2016-09-12 ENCOUNTER — Ambulatory Visit (HOSPITAL_COMMUNITY)
Admission: RE | Admit: 2016-09-12 | Discharge: 2016-09-12 | Disposition: A | Discharge status: Home / Self Care | Payer: Medicare Other | Source: Ambulatory Visit | Attending: Cardiology | Admitting: Cardiology

## 2016-09-12 VITALS — BP 186/99 | HR 76 | Ht 74.0 in | Wt 264.8 lb

## 2016-09-12 DIAGNOSIS — N186 End stage renal disease: Secondary | ICD-10-CM

## 2016-09-12 DIAGNOSIS — Z951 Presence of aortocoronary bypass graft: Secondary | ICD-10-CM

## 2016-09-12 DIAGNOSIS — E1122 Type 2 diabetes mellitus with diabetic chronic kidney disease: Secondary | ICD-10-CM | POA: Insufficient documentation

## 2016-09-12 DIAGNOSIS — R0600 Dyspnea, unspecified: Secondary | ICD-10-CM | POA: Insufficient documentation

## 2016-09-12 DIAGNOSIS — E1165 Type 2 diabetes mellitus with hyperglycemia: Secondary | ICD-10-CM | POA: Diagnosis not present

## 2016-09-12 DIAGNOSIS — I2510 Atherosclerotic heart disease of native coronary artery without angina pectoris: Secondary | ICD-10-CM | POA: Diagnosis not present

## 2016-09-12 DIAGNOSIS — R9439 Abnormal result of other cardiovascular function study: Secondary | ICD-10-CM | POA: Diagnosis not present

## 2016-09-12 DIAGNOSIS — I517 Cardiomegaly: Secondary | ICD-10-CM | POA: Diagnosis not present

## 2016-09-12 DIAGNOSIS — E1151 Type 2 diabetes mellitus with diabetic peripheral angiopathy without gangrene: Secondary | ICD-10-CM | POA: Diagnosis not present

## 2016-09-12 DIAGNOSIS — I200 Unstable angina: Secondary | ICD-10-CM

## 2016-09-12 DIAGNOSIS — Z992 Dependence on renal dialysis: Secondary | ICD-10-CM

## 2016-09-12 DIAGNOSIS — I120 Hypertensive chronic kidney disease with stage 5 chronic kidney disease or end stage renal disease: Secondary | ICD-10-CM | POA: Diagnosis not present

## 2016-09-12 DIAGNOSIS — I10 Essential (primary) hypertension: Secondary | ICD-10-CM | POA: Diagnosis not present

## 2016-09-12 DIAGNOSIS — R0789 Other chest pain: Secondary | ICD-10-CM

## 2016-09-12 DIAGNOSIS — IMO0002 Reserved for concepts with insufficient information to code with codable children: Secondary | ICD-10-CM

## 2016-09-12 DIAGNOSIS — R072 Precordial pain: Secondary | ICD-10-CM

## 2016-09-12 MED ORDER — HYDRALAZINE HCL 25 MG PO TABS: ORAL_TABLET | ORAL | 6 refills | Status: DC

## 2016-09-12 MED ORDER — PERFLUTREN LIPID MICROSPHERE
1.0000 mL | INTRAVENOUS | Status: AC | PRN
  Administered 2016-09-12: 2 mL via INTRAVENOUS
  Filled 2016-09-12: qty 10

## 2016-09-12 NOTE — Progress Notes (Signed)
 Cardiology Office Note    Date:  09/13/2016   ID:  Russell [name], DOB 09/04/1970, MRN [id number]  PCP:  Shaw, Eva N, MD  Cardiologist:  Dr. Crenshaw   Chief Complaint  Patient presents with  . Follow-up    seen for Dr. Crenshaw    History of Present Illness:  Russell [name] is a 46 y.o. male with PMH of asthma, CAD, h/o DVT, ESRD on dialysis MWF, HTN, hypothyroidism, and DM II. Renal Doppler in May 2016 was normal. He had bilateral DVT around 2 years ago and completed 6 months of Xarelto therapy. Echocardiogram in September 2017 showed normal LV function, moderate LVH. He had a cardiac catheterization in September 2017 showed 90% ramus, 95% first diagonal, 80% proximal LAD and normal LV function. He subsequently had CABG with LIMA to LAD, SVG to diagonal, SVG to obtuse marginal. He was last seen by Dr. Crenshaw on 01/27/2016, 6 month follow-up was recommended. Dr. Crenshaw was thinking about discontinuing his Plavix during the next office visit. He was admitted in May for acute on chronic respiratory failure secondary to fluid overload, this was felt to be secondary to non-adherence to his peritoneal dialysis at home. Echocardiogram showed EF 55%, no wall motion abnormality, grade 2 diastolic dysfunction, moderate pulmonary hypertension. CTA was negative for PE. Unfortunately, he returned to the hospital on 05/23/2016 with recurrent chest pain. The chest pain was felt to be atypical and pleuritic. Initial troponin was negative. A limited echo was repeated on 05/17/2016 to check for prior cardial effusion and rule out pericarditis. Echocardiogram obtained on that day showed EF 55%, grade 2 diastolic dysfunction, mild LVH, poorly visualized right ventricle, PA peak pressure 55 mmHg.  He is evaluated by renal transplant team at [facility]. Transplant team requested echocardiogram and a stress test. During our last visit on 08/17/2016, discontinued his Plavix. His blood  pressure was elevated as he did not take his blood pressure medication at the time. He appears to be quite frustrated today, he says since our last visit, he has been having increasing amount of chest discomfort. It is the same chest discomfort he mentioned previously. It is worse with exertion but also quite noticeable with palpation as well. He says it is noticeable with deep inspiration body rotation too. He just had the echocardiogram today right prior to the visit, initially I was unable to see the echo result, however he says he was told by echo tech it looks like ejection fraction is around 45% range. I wanted to verify with echo reader, Dr. Skains was able to read the echocardiogram and he interpreted the ejection fraction around 45-50% range. Given persistent symptoms, I discussed the case with DOD Dr. Berry, personally I think the chest pain is less concerning, I was more concerned about his increasing fatigue and shortness of breath which he says is very similar to his previous symptom prior to the bypass surgery last year. In fact he never had chest pain prior to the bypass surgery. He eventually we agreed to proceed with another diagnostic cardiac catheterization. His blood pressure has also been running high, some of this likely has attributed by his significant intermittent chest discomfort. He takes his amlodipine faithfully on the night of dialysis. I will add hydralazine 25 mg 3 times a day with instruction to hold it in the morning of dialysis.    Past Medical History:  Diagnosis Date  . Anxiety   . Asthma   .   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 Daily headache   . Depression   . DVT (deep venous thrombosis), H/o 01/2014-on Xarelto 03/24/2014   LLE  . ESRD (end stage renal disease) on dialysis (HCC)    "[place], MWF" (06/29/2016)  .  Hypertension   . Hypothyroidism   . Nephrotic syndrome 05/18/2014  . Type 2 diabetes mellitus with diabetic nephropathy (HCC)     Past Surgical History:  Procedure Laterality Date  . ANKLE FRACTURE SURGERY Right 1988  . AV FISTULA PLACEMENT Left 01/01/2015   Procedure: CREATION OF LEFT RADIAL CEPHALIC ARTERIOVENOUS (AV) FISTULA ;  Surgeon: [name] D Lawson, MD;  Location: [facility] OR;  Service: Vascular;  Laterality: Left;  . CAPD REMOVAL N/A 05/19/2016   Procedure: PD CATH REMOVAL;  Surgeon: Blackman, Douglas, MD;  Location: [facility] OR;  Service: General;  Laterality: N/A;  . CARDIAC CATHETERIZATION N/A 09/22/2015   Procedure: Left Heart Cath and Coronary Angiography;  Surgeon: Muhammad A Arida, MD;  Location: [facility] INVASIVE CV LAB;  Service: Cardiovascular;  Laterality: N/A;  . CORONARY ARTERY BYPASS GRAFT N/A 09/28/2015   Procedure: CORONARY ARTERY BYPASS GRAFTING (CABG) x 3 UTILIZING LEFT MAMMARY ARTERY AND ENDOSCOPICALLY HARVESTED LEFT GREATER SAPHENOUS VEIN.;  Surgeon: Edward B Gerhardt, MD;  Location: [facility] OR;  Service: Open Heart Surgery;  Laterality: N/A;  . ENDOVEIN HARVEST OF GREATER SAPHENOUS VEIN Left 09/28/2015   Procedure: ENDOVEIN HARVEST OF GREATER SAPHENOUS VEIN;  Surgeon: Edward B Gerhardt, MD;  Location: [facility] OR;  Service: Open Heart Surgery;  Laterality: Left;  . ESOPHAGOGASTRODUODENOSCOPY N/A 03/29/2014   Procedure: ESOPHAGOGASTRODUODENOSCOPY (EGD);  Surgeon: John Hayes, MD;  Location: [facility] ENDOSCOPY;  Service: Endoscopy;  Laterality: N/A;  . EYE SURGERY    . FRACTURE SURGERY    . INSERTION OF DIALYSIS CATHETER Right 01/05/2015   Procedure: INSERTION OF RIGHT INTERNAL JUGULAR DIALYSIS CATHETER;  Surgeon: Brian L Chen, MD;  Location: [facility] OR;  Service: Vascular;  Laterality: Right;  . RETINAL LASER PROCEDURE Bilateral   . TEE WITHOUT CARDIOVERSION N/A 09/28/2015   Procedure: TRANSESOPHAGEAL ECHOCARDIOGRAM (TEE);  Surgeon: Edward B Gerhardt, MD;  Location: [facility] OR;  Service: Open Heart Surgery;   Laterality: N/A;  . TONSILLECTOMY AND ADENOIDECTOMY  1970s    Current Medications: Outpatient Medications Prior to Visit  Medication Sig Dispense Refill  . acetaminophen (TYLENOL) 500 MG tablet Take 1 tablet (500 mg total) by mouth every 6 (six) hours as needed for moderate pain. (Patient taking differently: Take 1,000 mg by mouth every 6 (six) hours as needed (for pain). ) 30 tablet 0  . albuterol (PROVENTIL HFA;VENTOLIN HFA) 108 (90 Base) MCG/ACT inhaler Inhale 1 puff into the lungs every 6 (six) hours as needed for wheezing or shortness of breath. 1 Inhaler 11  . albuterol (PROVENTIL) (2.5 MG/3ML) 0.083% nebulizer solution Take 3 mLs (2.5 mg total) by nebulization every 4 (four) hours as needed for wheezing. 150 mL 2  . albuterol-ipratropium (COMBIVENT) 18-103 MCG/ACT inhaler Inhale 2 puffs into the lungs every 4 (four) hours. (Patient taking differently: Inhale 2 puffs into the lungs every 6 (six) hours as needed for wheezing or shortness of breath. ) 1 Inhaler 11  . amLODipine (NORVASC) 10 MG tablet Take 1 tablet (10 mg total) by mouth daily. DO NOT TAKE ON DIALYSIS DAYS 90 tablet 1  . aspirin EC 81 MG EC tablet   Take 1 tablet (81 mg total) by mouth daily.    . atorvastatin (LIPITOR) 80 MG tablet Take 1 tablet (80 mg total) by mouth daily at 6 PM. 90 tablet 1  . calcitRIOL (ROCALTROL) 0.25 MCG capsule Take 1 capsule (0.25 mcg total) by mouth every Monday, Wednesday, and Friday with hemodialysis. (Patient taking differently: Take 0.25 mcg by mouth See admin instructions. Tuesday Thursday Saturday) 30 capsule 3  . calcium acetate (PHOSLO) 667 MG capsule Take 2 capsules (1,334 mg total) by mouth 3 (three) times daily with meals. 180 capsule 0  . citalopram (CELEXA) 40 MG tablet Take 40 mg by mouth at bedtime as needed.    . clonazePAM (KLONOPIN) 2 MG tablet Take 2 mg by mouth daily.    . clopidogrel (PLAVIX) 75 MG tablet Take 1 tablet (75 mg total) by mouth daily. 90 tablet 3  . cyclobenzaprine  (FLEXERIL) 10 MG tablet Take 1 tablet (10 mg total) by mouth at bedtime. 30 tablet 11  . gabapentin (NEURONTIN) 100 MG capsule Take 2 capsules (200 mg total) by mouth 3 (three) times daily. (Patient taking differently: Take 800 mg by mouth 2 (two) times daily. ) 180 capsule 11  . HYDROcodone-acetaminophen (NORCO/VICODIN) 5-325 MG tablet Take 1-2 tablets by mouth every 4 (four) hours as needed for moderate pain. 60 tablet 0  . hydrOXYzine (ATARAX/VISTARIL) 25 MG tablet Take 25 mg by mouth at bedtime.     . insulin glargine (LANTUS) 100 UNIT/ML injection Inject 0.4 mLs (40 Units total) into the skin 2 (two) times daily. 10 mL 0  . insulin lispro (HUMALOG) 100 UNIT/ML injection Inject 0.15-0.3 mLs (15-30 Units total) into the skin every evening. Take one unit per 5 carbs depending on dinner. 10 mL 2  . levothyroxine (SYNTHROID, LEVOTHROID) 50 MCG tablet Take 1 tablet (50 mcg total) by mouth daily before breakfast. 90 tablet 0  . metoCLOPramide (REGLAN) 5 MG tablet Take 1 tablet (5 mg total) by mouth every 8 (eight) hours as needed for nausea or vomiting. (Patient taking differently: Take 5 mg by mouth 2 (two) times daily. ) 30 tablet 0  . metoprolol tartrate (LOPRESSOR) 50 MG tablet Take 2 tablets (100 mg total) by mouth 2 (two) times daily.    . azithromycin (ZITHROMAX) 250 MG tablet Take 2 tabs PO x 1 dose, then 1 tab PO QD x 4 days 6 tablet 0  . pantoprazole (PROTONIX) 40 MG tablet Take 1 tablet (40 mg total) by mouth daily. 14 tablet 0   No facility-administered medications prior to visit.      Allergies:   Ibuprofen; Nsaids; and Tape   Social History   Social History  . Marital status: Significant Other    Spouse name: N/A  . Number of children: N/A  . Years of education: N/A   Occupational History  .      Maintenance mechanic   Social History Main Topics  . Smoking status: Never Smoker  . Smokeless tobacco: Never Used  . Alcohol use 0.0 oz/week     Comment: 06/29/2016 "might have a  few drinks/year"  . Drug use: No  . Sexual activity: Yes   Other Topics Concern  . None   Social History Narrative   Patient currently lives with his fiance   Works with facilities   Nonsmoker, nondrinker        Family History:  The patient's family history includes Cancer (age of onset: 50) in his father; Heart disease in his unknown relative;   Kidney cancer in his maternal grandmother; Obesity in his mother.   ROS:   Please see the history of present illness.    ROS All other systems reviewed and are negative.   PHYSICAL EXAM:   VS:  BP (!) 186/99   Pulse 76   Ht 6' 2" (1.88 m)   Wt 264 lb 12.8 oz (120.1 kg)   BMI 34.00 kg/m    GEN: Well nourished, well developed, in no acute distress  HEENT: normal  Neck: no JVD, carotid bruits, or masses Cardiac: RRR; no murmurs, rubs, or gallops,no edema  Respiratory:  clear to auscultation bilaterally, normal work of breathing GI: soft, nontender, nondistended, + BS MS: no deformity or atrophy  Skin: warm and dry, no rash Neuro:  Alert and Oriented x 3, Strength and sensation are intact Psych: euthymic mood, full affect  Wt Readings from Last 3 Encounters:  09/12/16 264 lb 12.8 oz (120.1 kg)  08/31/16 267 lb (121.1 kg)  08/21/16 260 lb (117.9 kg)      Studies/Labs Reviewed:   EKG:  EKG is ordered today.  The ekg ordered today demonstrates Normal sinus rhythm, poor R wave progression in anterior leads.  Recent Labs: 05/23/2016: B Natriuretic Peptide 299.0 06/30/2016: Magnesium 2.4 08/17/2016: ALT 12; TSH 4.900 09/12/2016: BUN 47; Creatinine, Ser 8.42; Hemoglobin 11.1; Platelets 294; Potassium 5.1; Sodium 134   Lipid Panel    Component Value Date/Time   CHOL 196 05/17/2016 0208   CHOL 228 (H) 04/17/2016 0900   TRIG 246 (H) 05/17/2016 0208   HDL 32 (L) 05/17/2016 0208   HDL 33 (L) 04/17/2016 0900   CHOLHDL 6.1 05/17/2016 0208   VLDL 49 (H) 05/17/2016 0208   LDLCALC 115 (H) 05/17/2016 0208   LDLCALC 126 (H) 04/17/2016  0900    Additional studies/ records that were reviewed today include:   Cath 09/22/2015 Conclusion     Ramus lesion, 90 %stenosed.  Ost 1st Diag to 1st Diag lesion, 95 %stenosed.  Prox LAD lesion, 80 %stenosed.  The left ventricular systolic function is normal.  LV end diastolic pressure is mildly elevated.  The left ventricular ejection fraction is 55-65% by visual estimate.   1. Severe two-vessel coronary arte involving a bifurcation proximal LAD/diagonal 1 and proximal large ramus. 2. Normal LV systolic function with mildly elevated LVEDP.   Recommendations: Given that the patient is diabetic with proximal bifurcation LAD involvement, I think the best option is CABG. I consulted CT surgery.  Resume heparin 8 hours after sheath pulled. I'm going to start nitroglycerin drip for blood pressure control and for mild chest pain.     Myoview 08/31/2016 Study Highlights    The left ventricular ejection fraction is moderately decreased (30-44%).  Nuclear stress EF: 42%.  There was no ST segment deviation noted during stress.  No T wave inversion was noted during stress.  The study is normal.  This is an intermediate risk study due to reduced systolic function. There was no ischemia     Echo 09/12/2016 LV EF: 45% -   50%  Study Conclusions  - Left ventricle: The cavity size was mildly dilated. Wall   thickness was increased in a pattern of mild LVH. There was mild   concentric hypertrophy. Systolic function was mildly reduced. The   estimated ejection fraction was in the range of 45% to 50%.   Diffuse hypokinesis. Features are consistent with a pseudonormal   left ventricular filling pattern, with concomitant abnormal   relaxation and increased   filling pressure (grade 2 diastolic   dysfunction). - Aortic valve: Trileaflet; mildly thickened, mildly calcified   leaflets. - Left atrium: The atrium was mildly dilated. - Pulmonary arteries: Systolic pressure was  mildly increased. PA   peak pressure: 32 mm Hg (S).     ASSESSMENT:    1. Atypical chest pain   2. S/P CABG (coronary artery bypass graft)   3. Uncontrolled diabetes mellitus type 2 with peripheral artery disease (HCC)   4. Malignant hypertension   5. ESRD on dialysis (HCC)      PLAN:  In order of problems listed above:  1. Atypical chest pain: Myoview and recent echocardiogram performed at the request of [facility] renal transplant service. However, since our last visit, he continued to have very atypical chest pain, it is worse with exertion, however he has a noticeable tender area in the chest that is worse with deep inspiration, body rotation and palpation. In fact he never had any chest pain prior to the previous CABG. I was less concerned about his chest pain but more concerned about his description of recent increasing shortness of breath and fatigue which he says is very similar to what he experienced prior to the previous bypass surgery. Last echocardiogram in May showed EF 55%, more recently Myoview showed EF 43% which is likely underestimated, we repeated echocardiogram, it came back with ejection fraction about 45-50%. Given his persistently worsening symptom, I discussed the case with Dr. Barry DOD, who recommended diagnostic cardiac catheterization to definitively assess his coronary anatomy.   2. ESRD on dialysis MWF: Undergoing evaluation by [facility] renal transplant team  3. Malignant hypertension: Despite my addition of amlodipine 10 mg on nondialysis days and also increase of metoprolol to 100 mg twice a day, his blood pressure continued to be running high. I will add hydralazine 25 mg 3 times a day with instruction to hold it in the morning of dialysis as his blood pressure typically drops quite significantly with dialysis.  4. DM II: We'll defer to primary care provider, question compliance, last hemoglobin A1c was 12.8  he May 2018    Medication Adjustments/Labs and Tests Ordered: Current medicines are reviewed at length with the patient today.  Concerns regarding medicines are outlined above.  Medication changes, Labs and Tests ordered today are listed in the Patient Instructions below. Patient Instructions    [facility] [address] [place] [address] Dept: [phone] Loc: [phone]  [place] [name]  09/12/2016  You are scheduled for a Cardiac Cath on Wednesday 09/13/16, with Dr.Arida.  1. Please arrive at the North Tower (Main Entrance A) at [facility]: [address] [place], [place] [address] at 12:00 noon (two hours before your procedure to ensure your preparation). Free valet parking service is available.   Special note: Every effort is made to have your procedure done on time. Please understand that emergencies sometimes delay scheduled procedures.  2. Diet: Nothing to eat or drink after 7:30 am.  3. Labs: Today ( bmet,cbc,pt )  4. Medication instructions in preparation for your procedure:          Take 1/2 dose insulin tonight.  Hold morning dose insulin.       Take Aspirin and Plavix morning of cath       Start Hydralazine 25 mg three times a day.On morning dose on dialysis days    On   the morning of your procedure, take your  morning medicines NOT listed above.  You may use sips of water.  5. Plan for one night stay--bring personal belongings. 6. Bring a current list of your medications and current insurance cards. 7. You MUST have a responsible person to drive you home. 8. Someone MUST be with you the first 24 hours after you arrive home or your discharge will be delayed. 9. Please wear clothes that are easy to get on and off and wear slip-on shoes.  Thank you for allowing us to care for you!   -- [facility]     Signed, Ifeoluwa Bartz, PA    09/13/2016 6:04 AM    [facility] [address], [place], [place]  [address] Phone: [phone]; Fax: [phone]   

## 2016-09-12 NOTE — Progress Notes (Signed)
I discussed with Dr. Anne FuSkains echo reader and Dr. Allyson SabalBerry, patient complained of persistent atypical chest pain with increasing fatigue and SOB, he is on the board for cath tomorrow for definitive evaluation.

## 2016-09-12 NOTE — Patient Instructions (Signed)
   [facility] [address] [place] [address] Dept: [phone] Loc: [phone]  Fonnie BirkenheadSteven B Passow  09/12/2016  You are scheduled for a Cardiac Cath on Wednesday 09/13/16, with Dr.Arida.  1. [facility]: [address] [place], [place] [address] at 12:00 noon (two hours before your procedure to ensure your preparation). Free valet parking service is available.   Special note: Every effort is made to have your procedure done on time. Please understand that emergencies sometimes delay scheduled procedures.  2. Diet: Nothing to eat or drink after 7:30 am.  3. Labs: Today ( bmet,cbc,pt )  4. Medication instructions in preparation for your procedure:          Take 1/2 dose insulin tonight.  Hold morning dose insulin.       Take Aspirin and Plavix morning of cath       Start Hydralazine 25 mg three times a day.On morning dose on dialysis days    On the morning of your procedure, take your  morning medicines NOT listed above.  You may use sips of water.  5. Plan for one night stay--bring personal belongings. 6. Bring a current list of your medications and current insurance cards. 7. You MUST have a responsible person to drive you home. 8. Someone MUST be with you the first 24 hours after you arrive home or your discharge will be delayed. 9. Please wear clothes that are easy to get on and off and wear slip-on shoes.  Thank you for allowing us to care for you!   -- [place] [facility]

## 2016-09-13 ENCOUNTER — Encounter (HOSPITAL_COMMUNITY)
Admission: RE | Disposition: A | Discharge status: Home / Self Care | Payer: Self-pay | Source: Ambulatory Visit | Attending: Cardiovascular Disease

## 2016-09-13 ENCOUNTER — Encounter: Payer: Self-pay | Admitting: Physician Assistant

## 2016-09-13 ENCOUNTER — Other Ambulatory Visit: Payer: Self-pay | Admitting: Physician Assistant

## 2016-09-13 ENCOUNTER — Ambulatory Visit (HOSPITAL_COMMUNITY)
Admission: RE | Admit: 2016-09-13 | Discharge: 2016-09-13 | Disposition: A | Discharge status: Home / Self Care | Payer: Medicare Other | Source: Ambulatory Visit | Attending: Cardiovascular Disease | Admitting: Cardiovascular Disease

## 2016-09-13 DIAGNOSIS — F329 Major depressive disorder, single episode, unspecified: Secondary | ICD-10-CM | POA: Diagnosis not present

## 2016-09-13 DIAGNOSIS — E039 Hypothyroidism, unspecified: Secondary | ICD-10-CM | POA: Insufficient documentation

## 2016-09-13 DIAGNOSIS — Z992 Dependence on renal dialysis: Secondary | ICD-10-CM | POA: Diagnosis not present

## 2016-09-13 DIAGNOSIS — Z86718 Personal history of other venous thrombosis and embolism: Secondary | ICD-10-CM | POA: Insufficient documentation

## 2016-09-13 DIAGNOSIS — Z7982 Long term (current) use of aspirin: Secondary | ICD-10-CM | POA: Diagnosis not present

## 2016-09-13 DIAGNOSIS — Z951 Presence of aortocoronary bypass graft: Secondary | ICD-10-CM | POA: Insufficient documentation

## 2016-09-13 DIAGNOSIS — N186 End stage renal disease: Secondary | ICD-10-CM | POA: Insufficient documentation

## 2016-09-13 DIAGNOSIS — Z794 Long term (current) use of insulin: Secondary | ICD-10-CM | POA: Diagnosis not present

## 2016-09-13 DIAGNOSIS — I120 Hypertensive chronic kidney disease with stage 5 chronic kidney disease or end stage renal disease: Secondary | ICD-10-CM | POA: Diagnosis not present

## 2016-09-13 DIAGNOSIS — J9610 Chronic respiratory failure, unspecified whether with hypoxia or hypercapnia: Secondary | ICD-10-CM | POA: Insufficient documentation

## 2016-09-13 DIAGNOSIS — Z7682 Awaiting organ transplant status: Secondary | ICD-10-CM | POA: Diagnosis not present

## 2016-09-13 DIAGNOSIS — J45909 Unspecified asthma, uncomplicated: Secondary | ICD-10-CM | POA: Diagnosis not present

## 2016-09-13 DIAGNOSIS — E1122 Type 2 diabetes mellitus with diabetic chronic kidney disease: Secondary | ICD-10-CM | POA: Insufficient documentation

## 2016-09-13 DIAGNOSIS — E211 Secondary hyperparathyroidism, not elsewhere classified: Secondary | ICD-10-CM | POA: Diagnosis not present

## 2016-09-13 DIAGNOSIS — N2581 Secondary hyperparathyroidism of renal origin: Secondary | ICD-10-CM | POA: Diagnosis not present

## 2016-09-13 DIAGNOSIS — E1151 Type 2 diabetes mellitus with diabetic peripheral angiopathy without gangrene: Secondary | ICD-10-CM | POA: Diagnosis not present

## 2016-09-13 DIAGNOSIS — E1121 Type 2 diabetes mellitus with diabetic nephropathy: Secondary | ICD-10-CM | POA: Insufficient documentation

## 2016-09-13 DIAGNOSIS — F419 Anxiety disorder, unspecified: Secondary | ICD-10-CM | POA: Insufficient documentation

## 2016-09-13 DIAGNOSIS — I2510 Atherosclerotic heart disease of native coronary artery without angina pectoris: Secondary | ICD-10-CM | POA: Insufficient documentation

## 2016-09-13 DIAGNOSIS — D631 Anemia in chronic kidney disease: Secondary | ICD-10-CM | POA: Diagnosis not present

## 2016-09-13 DIAGNOSIS — Z23 Encounter for immunization: Secondary | ICD-10-CM | POA: Diagnosis not present

## 2016-09-13 HISTORY — PX: LEFT HEART CATH AND CORS/GRAFTS ANGIOGRAPHY: CATH118250

## 2016-09-13 LAB — BASIC METABOLIC PANEL
BUN / CREAT RATIO: 6 — AB (ref 9–20)
BUN: 47 mg/dL — AB (ref 6–24)
CO2: 23 mmol/L (ref 20–29)
CREATININE: 8.42 mg/dL — AB (ref 0.76–1.27)
Calcium: 8.9 mg/dL (ref 8.7–10.2)
Chloride: 90 mmol/L — ABNORMAL LOW (ref 96–106)
GFR, EST AFRICAN AMERICAN: 8 mL/min/{1.73_m2} — AB (ref >59)
GFR, EST NON AFRICAN AMERICAN: 7 mL/min/{1.73_m2} — AB (ref >59)
Glucose: 319 mg/dL — ABNORMAL HIGH (ref 65–99)
Potassium: 5.1 mmol/L (ref 3.5–5.2)
Sodium: 134 mmol/L (ref 134–144)

## 2016-09-13 LAB — GLUCOSE, CAPILLARY
Glucose-Capillary: 237 mg/dL — ABNORMAL HIGH (ref 65–99)
Glucose-Capillary: 210 mg/dL — ABNORMAL HIGH (ref 65–99)
Glucose-Capillary: 186 mg/dL — ABNORMAL HIGH (ref 65–99)

## 2016-09-13 LAB — CBC WITH DIFFERENTIAL/PLATELET
BASOS: 1 %
Basophils Absolute: 0.1 10*3/uL (ref 0.0–0.2)
EOS (ABSOLUTE): 0.3 10*3/uL (ref 0.0–0.4)
EOS: 5 %
HEMATOCRIT: 33.7 % — AB (ref 37.5–51.0)
HEMOGLOBIN: 11.1 g/dL — AB (ref 13.0–17.7)
Immature Grans (Abs): 0 10*3/uL (ref 0.0–0.1)
Immature Granulocytes: 1 %
LYMPHS ABS: 1.2 10*3/uL (ref 0.7–3.1)
Lymphs: 20 %
MCH: 28.8 pg (ref 26.6–33.0)
MCHC: 32.9 g/dL (ref 31.5–35.7)
MCV: 87 fL (ref 79–97)
MONOCYTES: 8 %
Monocytes Absolute: 0.5 10*3/uL (ref 0.1–0.9)
NEUTROS ABS: 3.8 10*3/uL (ref 1.4–7.0)
Neutrophils: 65 %
Platelets: 294 10*3/uL (ref 150–379)
RBC: 3.86 x10E6/uL — AB (ref 4.14–5.80)
RDW: 16.4 % — ABNORMAL HIGH (ref 12.3–15.4)
WBC: 5.9 10*3/uL (ref 3.4–10.8)

## 2016-09-13 LAB — PT AND PTT
INR: 1 (ref 0.8–1.2)
PROTHROMBIN TIME: 10.7 s (ref 9.1–12.0)
aPTT: 26 s (ref 24–33)

## 2016-09-13 SURGERY — LEFT HEART CATH AND CORS/GRAFTS ANGIOGRAPHY
Anesthesia: LOCAL

## 2016-09-13 MED ORDER — LIDOCAINE HCL (PF) 1 % IJ SOLN
INTRAMUSCULAR | Status: DC | PRN
  Administered 2016-09-13: 15 mL

## 2016-09-13 MED ORDER — MIDAZOLAM HCL 2 MG/2ML IJ SOLN
INTRAMUSCULAR | Status: DC | PRN
  Administered 2016-09-13 (×2): 1 mg via INTRAVENOUS

## 2016-09-13 MED ORDER — MIDAZOLAM HCL 2 MG/2ML IJ SOLN
INTRAMUSCULAR | Status: AC
  Filled 2016-09-13: qty 2

## 2016-09-13 MED ORDER — FENTANYL CITRATE (PF) 100 MCG/2ML IJ SOLN
INTRAMUSCULAR | Status: AC
  Filled 2016-09-13: qty 2

## 2016-09-13 MED ORDER — SODIUM CHLORIDE 0.9% FLUSH: 3.0000 mL | INTRAVENOUS | Status: DC | PRN

## 2016-09-13 MED ORDER — HYDRALAZINE HCL 20 MG/ML IJ SOLN
20.0000 mg | INTRAMUSCULAR | Status: DC | PRN
Start: 2016-09-13 — End: 2016-09-14

## 2016-09-13 MED ORDER — LIDOCAINE HCL (PF) 1 % IJ SOLN
INTRAMUSCULAR | Status: AC
  Filled 2016-09-13: qty 30

## 2016-09-13 MED ORDER — SODIUM CHLORIDE 0.9% FLUSH: 3.0000 mL | Freq: Two times a day (BID) | INTRAVENOUS | Status: DC

## 2016-09-13 MED ORDER — IOPAMIDOL (ISOVUE-370) INJECTION 76%
INTRAVENOUS | Status: DC | PRN
  Administered 2016-09-13: 80 mL

## 2016-09-13 MED ORDER — FENTANYL CITRATE (PF) 100 MCG/2ML IJ SOLN
INTRAMUSCULAR | Status: DC | PRN
  Administered 2016-09-13 (×2): 25 ug via INTRAVENOUS

## 2016-09-13 MED ORDER — HYDRALAZINE HCL 20 MG/ML IJ SOLN
INTRAMUSCULAR | Status: DC | PRN
  Administered 2016-09-13: 10 mg via INTRAVENOUS

## 2016-09-13 MED ORDER — SODIUM CHLORIDE 0.9 % IV SOLN: 250.0000 mL | INTRAVENOUS | Status: DC | PRN

## 2016-09-13 MED ORDER — METOCLOPRAMIDE HCL 5 MG PO TABS
5.0000 mg | ORAL_TABLET | Freq: Once | ORAL | Status: AC
  Administered 2016-09-13: 5 mg via ORAL
  Filled 2016-09-13: qty 1

## 2016-09-13 MED ORDER — HEPARIN (PORCINE) IN NACL 2-0.9 UNIT/ML-% IJ SOLN
INTRAMUSCULAR | Status: DC | PRN
  Administered 2016-09-13: 16:00:00

## 2016-09-13 MED ORDER — ASPIRIN 81 MG PO CHEW
81.0000 mg | CHEWABLE_TABLET | ORAL | Status: AC
Start: 2016-09-13 — End: 2016-09-13
  Administered 2016-09-13: 81 mg via ORAL

## 2016-09-13 MED ORDER — HYDRALAZINE HCL 20 MG/ML IJ SOLN
INTRAMUSCULAR | Status: AC
  Filled 2016-09-13: qty 1

## 2016-09-13 MED ORDER — SODIUM CHLORIDE 0.9 % IV SOLN: INTRAVENOUS | Status: DC

## 2016-09-13 MED ORDER — HYDROCODONE-ACETAMINOPHEN 5-325 MG PO TABS
ORAL_TABLET | ORAL | Status: AC
  Administered 2016-09-13: 2 via ORAL
  Filled 2016-09-13: qty 2

## 2016-09-13 MED ORDER — HEPARIN (PORCINE) IN NACL 2-0.9 UNIT/ML-% IJ SOLN
INTRAMUSCULAR | Status: AC
  Filled 2016-09-13: qty 1000

## 2016-09-13 MED ORDER — ASPIRIN 81 MG PO CHEW
CHEWABLE_TABLET | ORAL | Status: AC
  Filled 2016-09-13: qty 1

## 2016-09-13 MED ORDER — HYDROCODONE-ACETAMINOPHEN 5-325 MG PO TABS
2.0000 | ORAL_TABLET | Freq: Once | ORAL | Status: AC
  Administered 2016-09-13: 2 via ORAL

## 2016-09-13 SURGICAL SUPPLY — 7 items
CATH INFINITI 5FR MULTPACK ANG (CATHETERS) ×1 IMPLANT
KIT HEART LEFT (KITS) ×2 IMPLANT
PACK CARDIAC CATHETERIZATION (CUSTOM PROCEDURE TRAY) ×2 IMPLANT
SHEATH PINNACLE 5F 10CM (SHEATH) ×1 IMPLANT
TRANSDUCER W/STOPCOCK (MISCELLANEOUS) ×2 IMPLANT
TUBING CIL FLEX 10 FLL-RA (TUBING) ×2 IMPLANT
WIRE EMERALD 3MM-J .035X150CM (WIRE) ×1 IMPLANT

## 2016-09-13 NOTE — Discharge Instructions (Signed)
Angiogram, Care After °This sheet gives you information about how to care for yourself after your procedure. Your health care provider may also give you more specific instructions. If you have problems or questions, contact your health care provider. °What can I expect after the procedure? °After the procedure, it is common to have bruising and tenderness at the catheter insertion area. °Follow these instructions at home: °Insertion site care °· Follow instructions from your health care provider about how to take care of your insertion site. Make sure you: °? Wash your hands with soap and water before you change your bandage (dressing). If soap and water are not available, use hand sanitizer. °? Change your dressing as told by your health care provider. °? Leave stitches (sutures), skin glue, or adhesive strips in place. These skin closures may need to stay in place for 2 weeks or longer. If adhesive strip edges start to loosen and curl up, you may trim the loose edges. Do not remove adhesive strips completely unless your health care provider tells you to do that. °· Do not take baths, swim, or use a hot tub until your health care provider approves. °· You may shower 24-48 hours after the procedure or as told by your health care provider. °? Gently wash the site with plain soap and water. °? Pat the area dry with a clean towel. °? Do not rub the site. This may cause bleeding. °· Do not apply powder or lotion to the site. Keep the site clean and dry. °· Check your insertion site every day for signs of infection. Check for: °? Redness, swelling, or pain. °? Fluid or blood. °? Warmth. °? Pus or a bad smell. °Activity °· Rest as told by your health care provider, usually for 1-2 days. °· Do not lift anything that is heavier than 10 lbs. (4.5 kg) or as told by your health care provider. °· Do not drive for 24 hours if you were given a medicine to help you relax (sedative). °· Do not drive or use heavy machinery while  taking prescription pain medicine. °General instructions °· Return to your normal activities as told by your health care provider, usually in about a week. Ask your health care provider what activities are safe for you. °· If the catheter site starts bleeding, lie flat and put pressure on the site. If the bleeding does not stop, get help right away. This is a medical emergency. °· Drink enough fluid to keep your urine clear or pale yellow. This helps flush the contrast dye from your body. °· Take over-the-counter and prescription medicines only as told by your health care provider. °· Keep all follow-up visits as told by your health care provider. This is important. °Contact a health care provider if: °· You have a fever or chills. °· You have redness, swelling, or pain around your insertion site. °· You have fluid or blood coming from your insertion site. °· The insertion site feels warm to the touch. °· You have pus or a bad smell coming from your insertion site. °· You have bruising around the insertion site. °· You notice blood collecting in the tissue around the catheter site (hematoma). The hematoma may be painful to the touch. °Get help right away if: °· You have severe pain at the catheter insertion area. °· The catheter insertion area swells very fast. °· The catheter insertion area is bleeding, and the bleeding does not stop when you hold steady pressure on the area. °·   The area near or just beyond the catheter insertion site becomes pale, cool, tingly, or numb. °These symptoms may represent a serious problem that is an emergency. Do not wait to see if the symptoms will go away. Get medical help right away. Call your local emergency services (911 in the [place] Do not drive yourself to the hospital. °Summary °· After the procedure, it is common to have bruising and tenderness at the catheter insertion area. °· After the procedure, it is important to rest and drink plenty of fluids. °· Do not take baths,  swim, or use a hot tub until your health care provider says it is okay to do so. You may shower 24-48 hours after the procedure or as told by your health care provider. °· If the catheter site starts bleeding, lie flat and put pressure on the site. If the bleeding does not stop, get help right away. This is a medical emergency. °This information is not intended to replace advice given to you by your health care provider. Make sure you discuss any questions you have with your health care provider. °Document Released: 07/07/2004 Document Revised: 11/24/2015 Document Reviewed: 11/24/2015 °[employer] Interactive Patient Education © 2017 [employer] °Moderate Conscious Sedation, Adult, Care After °These instructions provide you with information about caring for yourself after your procedure. Your health care provider may also give you more specific instructions. Your treatment has been planned according to current medical practices, but problems sometimes occur. Call your health care provider if you have any problems or questions after your procedure. °What can I expect after the procedure? °After your procedure, it is common: °· To feel sleepy for several hours. °· To feel clumsy and have poor balance for several hours. °· To have poor judgment for several hours. °· To vomit if you eat too soon. ° °Follow these instructions at home: °For at least 24 hours after the procedure: ° °· Do not: °? Participate in activities where you could fall or become injured. °? Drive. °? Use heavy machinery. °? Drink alcohol. °? Take sleeping pills or medicines that cause drowsiness. °? Make important decisions or sign legal documents. °? Take care of children on your own. °· Rest. °Eating and drinking °· Follow the diet recommended by your health care provider. °· If you vomit: °? Drink water, juice, or soup when you can drink without vomiting. °? Make sure you have little or no nausea before eating solid foods. °General  instructions °· Have a responsible adult stay with you until you are awake and alert. °· Take over-the-counter and prescription medicines only as told by your health care provider. °· If you smoke, do not smoke without supervision. °· Keep all follow-up visits as told by your health care provider. This is important. °Contact a health care provider if: °· You keep feeling nauseous or you keep vomiting. °· You feel light-headed. °· You develop a rash. °· You have a fever. °Get help right away if: °· You have trouble breathing. °This information is not intended to replace advice given to you by your health care provider. Make sure you discuss any questions you have with your health care provider. °Document Released: 10/09/2012 Document Revised: 05/24/2015 Document Reviewed: 04/10/2015 °[employer] Interactive Patient Education © 2018 [employer] ° ° °

## 2016-09-13 NOTE — H&P (View-Only) (Signed)
Cardiology Office Note    Date:  09/13/2016   ID:  Russell [name], DOB Jul 15, 1970, MRN [id number]  PCP:  [name], MD  Cardiologist:  Dr. Jens Som   Chief Complaint  Patient presents with  . Follow-up    seen for Dr. Jens Som    History of Present Illness:  Russell [name] is a 46 y.o. male with PMH of asthma, CAD, h/o DVT, ESRD on dialysis MWF, HTN, hypothyroidism, and DM II. Renal Doppler in May 2016 was normal. He had bilateral DVT around 2 years ago and completed 6 months of Xarelto therapy. Echocardiogram in September 2017 showed normal LV function, moderate LVH. He had a cardiac catheterization in September 2017 showed 90% ramus, 95% first diagonal, 80% proximal LAD and normal LV function. He subsequently had CABG with LIMA to LAD, SVG to diagonal, SVG to obtuse marginal. He was last seen by Dr. Jens Som on 01/27/2016, 6 month follow-up was recommended. Dr. Jens Som was thinking about discontinuing his Plavix during the next office visit. He was admitted in May for acute on chronic respiratory failure secondary to fluid overload, this was felt to be secondary to non-adherence to his peritoneal dialysis at home. Echocardiogram showed EF 55%, no wall motion abnormality, grade 2 diastolic dysfunction, moderate pulmonary hypertension. CTA was negative for PE. Unfortunately, he returned to the hospital on 05/23/2016 with recurrent chest pain. The chest pain was felt to be atypical and pleuritic. Initial troponin was negative. A limited echo was repeated on 05/17/2016 to check for prior cardial effusion and rule out pericarditis. Echocardiogram obtained on that day showed EF 55%, grade 2 diastolic dysfunction, mild LVH, poorly visualized right ventricle, PA peak pressure 55 mmHg.  He is evaluated by renal transplant team at [facility]. Transplant team requested echocardiogram and a stress test. During our last visit on 08/17/2016, discontinued his Plavix. His blood  pressure was elevated as he did not take his blood pressure medication at the time. He appears to be quite frustrated today, he says since our last visit, he has been having increasing amount of chest discomfort. It is the same chest discomfort he mentioned previously. It is worse with exertion but also quite noticeable with palpation as well. He says it is noticeable with deep inspiration body rotation too. He just had the echocardiogram today right prior to the visit, initially I was unable to see the echo result, however he says he was told by echo tech it looks like ejection fraction is around 45% range. I wanted to verify with echo reader, Dr. Anne Fu was able to read the echocardiogram and he interpreted the ejection fraction around 45-50% range. Given persistent symptoms, I discussed the case with DOD Dr. Allyson Sabal, personally I think the chest pain is less concerning, I was more concerned about his increasing fatigue and shortness of breath which he says is very similar to his previous symptom prior to the bypass surgery last year. In fact he never had chest pain prior to the bypass surgery. He eventually we agreed to proceed with another diagnostic cardiac catheterization. His blood pressure has also been running high, some of this likely has attributed by his significant intermittent chest discomfort. He takes his amlodipine faithfully on the night of dialysis. I will add hydralazine 25 mg 3 times a day with instruction to hold it in the morning of dialysis.    Past Medical History:  Diagnosis Date  . Anxiety   . Asthma   .  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 Daily headache   . Depression   . DVT (deep venous thrombosis), H/o 01/2014-on Xarelto 03/24/2014   LLE  . ESRD (end stage renal disease) on dialysis [facility])    "[place], MWF" (06/29/2016)  .  Hypertension   . Hypothyroidism   . Nephrotic syndrome 05/18/2014  . Type 2 diabetes mellitus with diabetic nephropathy [facility])     Past Surgical History:  Procedure Laterality Date  . ANKLE FRACTURE SURGERY Right 1988  . AV FISTULA PLACEMENT Left 01/01/2015   Procedure: CREATION OF LEFT RADIAL CEPHALIC ARTERIOVENOUS (AV) FISTULA ;  Surgeon: Pryor Ochoa, MD;  Location: [facility] OR;  Service: Vascular;  Laterality: Left;  . CAPD REMOVAL N/A 05/19/2016   Procedure: PD CATH REMOVAL;  Surgeon: Abigail Miyamoto, MD;  Location: [facility] OR;  Service: General;  Laterality: N/A;  . CARDIAC CATHETERIZATION N/A 09/22/2015   Procedure: Left Heart Cath and Coronary Angiography;  Surgeon: Iran Ouch, MD;  Location: [facility] INVASIVE CV LAB;  Service: Cardiovascular;  Laterality: N/A;  . CORONARY ARTERY BYPASS GRAFT N/A 09/28/2015   Procedure: CORONARY ARTERY BYPASS GRAFTING (CABG) x 3 UTILIZING LEFT MAMMARY ARTERY AND ENDOSCOPICALLY HARVESTED LEFT GREATER SAPHENOUS VEIN.;  Surgeon: Delight Ovens, MD;  Location: [facility] OR;  Service: Open Heart Surgery;  Laterality: N/A;  . ENDOVEIN HARVEST OF GREATER SAPHENOUS VEIN Left 09/28/2015   Procedure: ENDOVEIN HARVEST OF GREATER SAPHENOUS VEIN;  Surgeon: Delight Ovens, MD;  Location: [facility] OR;  Service: Open Heart Surgery;  Laterality: Left;  . ESOPHAGOGASTRODUODENOSCOPY N/A 03/29/2014   Procedure: ESOPHAGOGASTRODUODENOSCOPY (EGD);  Surgeon: Dorena Cookey, MD;  Location: [facility];  Service: Endoscopy;  Laterality: N/A;  . EYE SURGERY    . FRACTURE SURGERY    . INSERTION OF DIALYSIS CATHETER Right 01/05/2015   Procedure: INSERTION OF RIGHT INTERNAL JUGULAR DIALYSIS CATHETER;  Surgeon: Fransisco Hertz, MD;  Location: [facility] OR;  Service: Vascular;  Laterality: Right;  . RETINAL LASER PROCEDURE Bilateral   . TEE WITHOUT CARDIOVERSION N/A 09/28/2015   Procedure: TRANSESOPHAGEAL ECHOCARDIOGRAM (TEE);  Surgeon: Delight Ovens, MD;  Location: [facility] OR;  Service: Open Heart Surgery;   Laterality: N/A;  . TONSILLECTOMY AND ADENOIDECTOMY  1970s    Current Medications: Outpatient Medications Prior to Visit  Medication Sig Dispense Refill  . acetaminophen (TYLENOL) 500 MG tablet Take 1 tablet (500 mg total) by mouth every 6 (six) hours as needed for moderate pain. (Patient taking differently: Take 1,000 mg by mouth every 6 (six) hours as needed (for pain). ) 30 tablet 0  . albuterol (PROVENTIL HFA;VENTOLIN HFA) 108 (90 Base) MCG/ACT inhaler Inhale 1 puff into the lungs every 6 (six) hours as needed for wheezing or shortness of breath. 1 Inhaler 11  . albuterol (PROVENTIL) (2.5 MG/3ML) 0.083% nebulizer solution Take 3 mLs (2.5 mg total) by nebulization every 4 (four) hours as needed for wheezing. 150 mL 2  . albuterol-ipratropium (COMBIVENT) 18-103 MCG/ACT inhaler Inhale 2 puffs into the lungs every 4 (four) hours. (Patient taking differently: Inhale 2 puffs into the lungs every 6 (six) hours as needed for wheezing or shortness of breath. ) 1 Inhaler 11  . amLODipine (NORVASC) 10 MG tablet Take 1 tablet (10 mg total) by mouth daily. DO NOT TAKE ON DIALYSIS DAYS 90 tablet 1  . aspirin EC 81 MG EC tablet  Take 1 tablet (81 mg total) by mouth daily.    Marland Kitchen atorvastatin (LIPITOR) 80 MG tablet Take 1 tablet (80 mg total) by mouth daily at 6 PM. 90 tablet 1  . calcitRIOL (ROCALTROL) 0.25 MCG capsule Take 1 capsule (0.25 mcg total) by mouth every Monday, Wednesday, and Friday with hemodialysis. (Patient taking differently: Take 0.25 mcg by mouth See admin instructions. Tuesday Thursday Saturday) 30 capsule 3  . calcium acetate (PHOSLO) 667 MG capsule Take 2 capsules (1,334 mg total) by mouth 3 (three) times daily with meals. 180 capsule 0  . citalopram (CELEXA) 40 MG tablet Take 40 mg by mouth at bedtime as needed.    . clonazePAM (KLONOPIN) 2 MG tablet Take 2 mg by mouth daily.    . clopidogrel (PLAVIX) 75 MG tablet Take 1 tablet (75 mg total) by mouth daily. 90 tablet 3  . cyclobenzaprine  (FLEXERIL) 10 MG tablet Take 1 tablet (10 mg total) by mouth at bedtime. 30 tablet 11  . gabapentin (NEURONTIN) 100 MG capsule Take 2 capsules (200 mg total) by mouth 3 (three) times daily. (Patient taking differently: Take 800 mg by mouth 2 (two) times daily. ) 180 capsule 11  . HYDROcodone-acetaminophen (NORCO/VICODIN) 5-325 MG tablet Take 1-2 tablets by mouth every 4 (four) hours as needed for moderate pain. 60 tablet 0  . hydrOXYzine (ATARAX/VISTARIL) 25 MG tablet Take 25 mg by mouth at bedtime.     . insulin glargine (LANTUS) 100 UNIT/ML injection Inject 0.4 mLs (40 Units total) into the skin 2 (two) times daily. 10 mL 0  . insulin lispro (HUMALOG) 100 UNIT/ML injection Inject 0.15-0.3 mLs (15-30 Units total) into the skin every evening. Take one unit per 5 carbs depending on dinner. 10 mL 2  . levothyroxine (SYNTHROID, LEVOTHROID) 50 MCG tablet Take 1 tablet (50 mcg total) by mouth daily before breakfast. 90 tablet 0  . metoCLOPramide (REGLAN) 5 MG tablet Take 1 tablet (5 mg total) by mouth every 8 (eight) hours as needed for nausea or vomiting. (Patient taking differently: Take 5 mg by mouth 2 (two) times daily. ) 30 tablet 0  . metoprolol tartrate (LOPRESSOR) 50 MG tablet Take 2 tablets (100 mg total) by mouth 2 (two) times daily.    Marland Kitchen azithromycin (ZITHROMAX) 250 MG tablet Take 2 tabs PO x 1 dose, then 1 tab PO QD x 4 days 6 tablet 0  . pantoprazole (PROTONIX) 40 MG tablet Take 1 tablet (40 mg total) by mouth daily. 14 tablet 0   No facility-administered medications prior to visit.      Allergies:   Ibuprofen; Nsaids; and Tape   Social History   Social History  . Marital status: Significant Other    Spouse name: N/A  . Number of children: N/A  . Years of education: N/A   Occupational History  .      Maintenance Curator   Social History Main Topics  . Smoking status: Never Smoker  . Smokeless tobacco: Never Used  . Alcohol use 0.0 oz/week     Comment: 06/29/2016 "might have a  few drinks/year"  . Drug use: No  . Sexual activity: Yes   Other Topics Concern  . None   Social History Narrative   Patient currently lives with his fiance   Works with facilities   Nonsmoker, nondrinker        Family History:  The patient's family history includes Cancer (age of onset: 70) in his father; Heart disease in his unknown relative;  Kidney cancer in his maternal grandmother; Obesity in his mother.   ROS:   Please see the history of present illness.    ROS All other systems reviewed and are negative.   PHYSICAL EXAM:   VS:  BP (!) 186/99   Pulse 76   Ht  (1.88 m)   Wt 264 lb 12.8 oz (120.1 kg)   BMI 34.00 kg/m    GEN: Well nourished, well developed, in no acute distress  HEENT: normal  Neck: no JVD, carotid bruits, or masses Cardiac: RRR; no murmurs, rubs, or gallops,no edema  Respiratory:  clear to auscultation bilaterally, normal work of breathing GI: soft, nontender, nondistended, + BS MS: no deformity or atrophy  Skin: warm and dry, no rash Neuro:  Alert and Oriented x 3, Strength and sensation are intact Psych: euthymic mood, full affect  Wt Readings from Last 3 Encounters:  09/12/16 264 lb 12.8 oz (120.1 kg)  08/31/16 267 lb (121.1 kg)  08/21/16 260 lb (117.9 kg)      Studies/Labs Reviewed:   EKG:  EKG is ordered today.  The ekg ordered today demonstrates Normal sinus rhythm, poor R wave progression in anterior leads.  Recent Labs: 05/23/2016: B Natriuretic Peptide 299.0 06/30/2016: Magnesium 2.4 08/17/2016: ALT 12; TSH 4.900 09/12/2016: BUN 47; Creatinine, Ser 8.42; Hemoglobin 11.1; Platelets 294; Potassium 5.1; Sodium 134   Lipid Panel    Component Value Date/Time   CHOL 196 05/17/2016 0208   CHOL 228 (H) 04/17/2016 0900   TRIG 246 (H) 05/17/2016 0208   HDL 32 (L) 05/17/2016 0208   HDL 33 (L) 04/17/2016 0900   CHOLHDL 6.1 05/17/2016 0208   VLDL 49 (H) 05/17/2016 0208   LDLCALC 115 (H) 05/17/2016 0208   LDLCALC 126 (H) 04/17/2016  0900    Additional studies/ records that were reviewed today include:   Cath 09/22/2015 Conclusion     Ramus lesion, 90 %stenosed.  Ost 1st Diag to 1st Diag lesion, 95 %stenosed.  Prox LAD lesion, 80 %stenosed.  The left ventricular systolic function is normal.  LV end diastolic pressure is mildly elevated.  The left ventricular ejection fraction is 55-65% by visual estimate.   1. Severe two-vessel coronary arte involving a bifurcation proximal LAD/diagonal 1 and proximal large ramus. 2. Normal LV systolic function with mildly elevated LVEDP.   Recommendations: Given that the patient is diabetic with proximal bifurcation LAD involvement, I think the best option is CABG. I consulted CT surgery.  Resume heparin 8 hours after sheath pulled. I'm going to start nitroglycerin drip for blood pressure control and for mild chest pain.     Myoview 08/31/2016 Study Highlights    The left ventricular ejection fraction is moderately decreased (30-44%).  Nuclear stress EF: 42%.  There was no ST segment deviation noted during stress.  No T wave inversion was noted during stress.  The study is normal.  This is an intermediate risk study due to reduced systolic function. There was no ischemia     Echo 09/12/2016 LV EF: 45% -   50%  Study Conclusions  - Left ventricle: The cavity size was mildly dilated. Wall   thickness was increased in a pattern of mild LVH. There was mild   concentric hypertrophy. Systolic function was mildly reduced. The   estimated ejection fraction was in the range of 45% to 50%.   Diffuse hypokinesis. Features are consistent with a pseudonormal   left ventricular filling pattern, with concomitant abnormal   relaxation and increased  filling pressure (grade 2 diastolic   dysfunction). - Aortic valve: Trileaflet; mildly thickened, mildly calcified   leaflets. - Left atrium: The atrium was mildly dilated. - Pulmonary arteries: Systolic pressure was  mildly increased. PA   peak pressure: 32 mm Hg (S).     ASSESSMENT:    1. Atypical chest pain   2. S/P CABG (coronary artery bypass graft)   3. Uncontrolled diabetes mellitus type 2 with peripheral artery disease (HCC)   4. Malignant hypertension   5. ESRD on dialysis [facility])      PLAN:  In order of problems listed above:  1. Atypical chest pain: Myoview and recent echocardiogram performed at the request of [facility] renal transplant service. However, since our last visit, he continued to have very atypical chest pain, it is worse with exertion, however he has a noticeable tender area in the chest that is worse with deep inspiration, body rotation and palpation. In fact he never had any chest pain prior to the previous CABG. I was less concerned about his chest pain but more concerned about his description of recent increasing shortness of breath and fatigue which he says is very similar to what he experienced prior to the previous bypass surgery. Last echocardiogram in May showed EF 55%, more recently Myoview showed EF 43% which is likely underestimated, we repeated echocardiogram, it came back with ejection fraction about 45-50%. Given his persistently worsening symptom, I discussed the case with Dr. Gery Pray DOD, who recommended diagnostic cardiac catheterization to definitively assess his coronary anatomy.   2. ESRD on dialysis MWF: Undergoing evaluation by [facility] renal transplant team  3. Malignant hypertension: Despite my addition of amlodipine 10 mg on nondialysis days and also increase of metoprolol to 100 mg twice a day, his blood pressure continued to be running high. I will add hydralazine 25 mg 3 times a day with instruction to hold it in the morning of dialysis as his blood pressure typically drops quite significantly with dialysis.  4. DM II: We'll defer to primary care provider, question compliance, last hemoglobin A1c was 12.8  he May 2018    Medication Adjustments/Labs and Tests Ordered: Current medicines are reviewed at length with the patient today.  Concerns regarding medicines are outlined above.  Medication changes, Labs and Tests ordered today are listed in the Patient Instructions below. Patient Instructions    [facility] CARDIOVASCULAR DIVISION [facility] [address] [place] [address] Dept: [phone] Loc: [phone]  JORDYN DOANE  09/12/2016  You are scheduled for a Cardiac Cath on Wednesday 09/13/16, with Dr.Arida.  1. Please arrive at the [facility] (Main Entrance A) at [facility]: [address] [place], [place] [address] at 12:00 noon (two hours before your procedure to ensure your preparation). Free valet parking service is available.   Special note: Every effort is made to have your procedure done on time. Please understand that emergencies sometimes delay scheduled procedures.  2. Diet: Nothing to eat or drink after 7:30 am.  3. Labs: Today ( bmet,cbc,pt )  4. Medication instructions in preparation for your procedure:          Take 1/2 dose insulin tonight.  Hold morning dose insulin.       Take Aspirin and Plavix morning of cath       Start Hydralazine 25 mg three times a day.On morning dose on dialysis days    On  the morning of your procedure, take your  morning medicines NOT listed above.  You may use sips of water.  5. Plan for one night stay--bring personal belongings. 6. Bring a current list of your medications and current insurance cards. 7. You MUST have a responsible person to drive you home. 8. Someone MUST be with you the first 24 hours after you arrive home or your discharge will be delayed. 9. Please wear clothes that are easy to get on and off and wear slip-on shoes.  Thank you for allowing us to care for you!   -- [facility]     Signed, Azalee CourseHao Bobbi Yount, [place]    09/13/2016 6:04 AM    [facility] [address], [place], [place]  [address] Phone: [phone]; Fax: [phone]

## 2016-09-13 NOTE — Interval H&P Note (Signed)
History and Physical Interval Note:  09/13/2016 3:48 PM  Russell [name]  has presented today for cardiac cath with the diagnosis of CAD s/p CABG with dyspnea worrisome for unstable angina. The various methods of treatment have been discussed with the patient and family. After consideration of risks, benefits and other options for treatment, the patient has consented to  Procedure(s): LEFT HEART CATH AND CORS/GRAFTS ANGIOGRAPHY (N/A) as a surgical intervention .  The patient's history has been reviewed, patient examined, no change in status, stable for surgery.  I have reviewed the patient's chart and labs.  Questions were answered to the patient's satisfaction.    Cath Lab Visit (complete for each Cath Lab visit)  Clinical Evaluation Leading to the Procedure:   ACS: No.  Non-ACS:    Anginal Classification: CCS II  Anti-ischemic medical therapy: Maximal Therapy (2 or more classes of medications)  Non-Invasive Test Results: Intermediate-risk stress test findings: cardiac mortality 1-3%/year  Prior CABG: Previous CABG        Russell [name]

## 2016-09-13 NOTE — Progress Notes (Addendum)
Site area: Right groin a 5 french arterial sheath was removed  Site Prior to Removal:  Level 0  Pressure Applied For 20 MINUTES    Bedrest Beginning at 1700p  Manual:   Yes.    Patient Status During Pull:  stable  Post Pull Groin Site:  Level 0  Post Pull Instructions Given:  Yes.    Post Pull Pulses Present:  Yes.    Dressing Applied:  Yes.    Comments:  VS remain stable during sheath pull.

## 2016-09-14 ENCOUNTER — Encounter (HOSPITAL_COMMUNITY): Payer: Self-pay | Admitting: Cardiovascular Disease

## 2016-09-15 DIAGNOSIS — Z23 Encounter for immunization: Secondary | ICD-10-CM | POA: Diagnosis not present

## 2016-09-15 DIAGNOSIS — N186 End stage renal disease: Secondary | ICD-10-CM | POA: Diagnosis not present

## 2016-09-15 DIAGNOSIS — D631 Anemia in chronic kidney disease: Secondary | ICD-10-CM | POA: Diagnosis not present

## 2016-09-15 DIAGNOSIS — N2581 Secondary hyperparathyroidism of renal origin: Secondary | ICD-10-CM | POA: Diagnosis not present

## 2016-09-15 DIAGNOSIS — E211 Secondary hyperparathyroidism, not elsewhere classified: Secondary | ICD-10-CM | POA: Diagnosis not present

## 2016-09-18 DIAGNOSIS — D631 Anemia in chronic kidney disease: Secondary | ICD-10-CM | POA: Diagnosis not present

## 2016-09-18 DIAGNOSIS — N186 End stage renal disease: Secondary | ICD-10-CM | POA: Diagnosis not present

## 2016-09-18 DIAGNOSIS — Z23 Encounter for immunization: Secondary | ICD-10-CM | POA: Diagnosis not present

## 2016-09-18 DIAGNOSIS — E211 Secondary hyperparathyroidism, not elsewhere classified: Secondary | ICD-10-CM | POA: Diagnosis not present

## 2016-09-18 DIAGNOSIS — N2581 Secondary hyperparathyroidism of renal origin: Secondary | ICD-10-CM | POA: Diagnosis not present

## 2016-09-20 DIAGNOSIS — N2581 Secondary hyperparathyroidism of renal origin: Secondary | ICD-10-CM | POA: Diagnosis not present

## 2016-09-20 DIAGNOSIS — Z23 Encounter for immunization: Secondary | ICD-10-CM | POA: Diagnosis not present

## 2016-09-20 DIAGNOSIS — N186 End stage renal disease: Secondary | ICD-10-CM | POA: Diagnosis not present

## 2016-09-20 DIAGNOSIS — D631 Anemia in chronic kidney disease: Secondary | ICD-10-CM | POA: Diagnosis not present

## 2016-09-20 DIAGNOSIS — E211 Secondary hyperparathyroidism, not elsewhere classified: Secondary | ICD-10-CM | POA: Diagnosis not present

## 2016-09-20 MED ORDER — ALBUTEROL SULFATE (2.5 MG/3ML) 0.083% IN NEBU: 2.5000 mg | INHALATION_SOLUTION | RESPIRATORY_TRACT | 2 refills | Status: DC | PRN

## 2016-09-20 NOTE — Telephone Encounter (Signed)
Dr. Clelia Croft, Called patient's pharmacy again today, and spoke to the pharmacist.  He told me that it is not a denial with the nebs, but they need a RX with an ICD-10 code printed on it because Medicare will not accept it otherwise.  I also called the patient and advised him that we had not forgotten about him and were just having an issue with Medicare.  He was very grateful for the call.   The RX needs to be faxed to [facility].  Fax Number is [phone]. Thank you for your help, Corde Antonini

## 2016-09-20 NOTE — Telephone Encounter (Signed)
done

## 2016-09-21 NOTE — Telephone Encounter (Addendum)
Medicare denied med diagnosis of J96.11 Chronic resp failure;  J20.9 Acute bronchitis Discussed with Dr Clelia Croft [facility]- spoke to she states the plan pt elected for Part D has rejected most everything, including insulin She looked up and offered Part B only covers Acute symptoms.  Part D only covers chronic condition meds and needs a Prior Auth.  She will fax info for [place] to initiate Prior Auth - received and started / [employer].   Denied.  Called [phone] for appeal Spoke with Toniann Fail.  Case # [id number] - Given the same ICD10 as above, she asked if chronic bronchitis w/acute exacerb J44.1 - agreed She advised this will be approved as an exception since Part B denied - effective for 1 year through 09/21/2017  Discussed with Dr. Clelia Croft.

## 2016-09-21 NOTE — Telephone Encounter (Signed)
Prescription for Albuterol/Proventil faxed to [facility] [phone] Included ICD 10:  J96.11, J20.9

## 2016-09-25 ENCOUNTER — Inpatient Hospital Stay (HOSPITAL_COMMUNITY)
Admission: EM | Admit: 2016-09-25 | Discharge: 2016-09-28 | DRG: 640 | Disposition: A | Discharge status: Home / Self Care | Payer: Medicare Other | Attending: Nephrology | Admitting: Nephrology

## 2016-09-25 DIAGNOSIS — I25110 Atherosclerotic heart disease of native coronary artery with unstable angina pectoris: Secondary | ICD-10-CM | POA: Diagnosis not present

## 2016-09-25 DIAGNOSIS — E1122 Type 2 diabetes mellitus with diabetic chronic kidney disease: Secondary | ICD-10-CM | POA: Diagnosis present

## 2016-09-25 DIAGNOSIS — E875 Hyperkalemia: Secondary | ICD-10-CM | POA: Diagnosis not present

## 2016-09-25 DIAGNOSIS — IMO0002 Reserved for concepts with insufficient information to code with codable children: Secondary | ICD-10-CM | POA: Diagnosis present

## 2016-09-25 DIAGNOSIS — R531 Weakness: Secondary | ICD-10-CM | POA: Diagnosis not present

## 2016-09-25 DIAGNOSIS — Z951 Presence of aortocoronary bypass graft: Secondary | ICD-10-CM

## 2016-09-25 DIAGNOSIS — I10 Essential (primary) hypertension: Secondary | ICD-10-CM | POA: Diagnosis present

## 2016-09-25 DIAGNOSIS — Z7902 Long term (current) use of antithrombotics/antiplatelets: Secondary | ICD-10-CM

## 2016-09-25 DIAGNOSIS — Z886 Allergy status to analgesic agent status: Secondary | ICD-10-CM

## 2016-09-25 DIAGNOSIS — N186 End stage renal disease: Secondary | ICD-10-CM | POA: Diagnosis present

## 2016-09-25 DIAGNOSIS — Z9115 Patient's noncompliance with renal dialysis: Secondary | ICD-10-CM

## 2016-09-25 DIAGNOSIS — D631 Anemia in chronic kidney disease: Secondary | ICD-10-CM | POA: Diagnosis present

## 2016-09-25 DIAGNOSIS — J810 Acute pulmonary edema: Secondary | ICD-10-CM | POA: Diagnosis not present

## 2016-09-25 DIAGNOSIS — E1151 Type 2 diabetes mellitus with diabetic peripheral angiopathy without gangrene: Secondary | ICD-10-CM | POA: Diagnosis present

## 2016-09-25 DIAGNOSIS — I509 Heart failure, unspecified: Secondary | ICD-10-CM | POA: Diagnosis not present

## 2016-09-25 DIAGNOSIS — J45909 Unspecified asthma, uncomplicated: Secondary | ICD-10-CM | POA: Diagnosis present

## 2016-09-25 DIAGNOSIS — R079 Chest pain, unspecified: Secondary | ICD-10-CM | POA: Diagnosis present

## 2016-09-25 DIAGNOSIS — E039 Hypothyroidism, unspecified: Secondary | ICD-10-CM | POA: Diagnosis present

## 2016-09-25 DIAGNOSIS — G8929 Other chronic pain: Secondary | ICD-10-CM | POA: Diagnosis present

## 2016-09-25 DIAGNOSIS — Z992 Dependence on renal dialysis: Secondary | ICD-10-CM

## 2016-09-25 DIAGNOSIS — E8770 Fluid overload, unspecified: Secondary | ICD-10-CM | POA: Diagnosis present

## 2016-09-25 DIAGNOSIS — E1140 Type 2 diabetes mellitus with diabetic neuropathy, unspecified: Secondary | ICD-10-CM | POA: Diagnosis present

## 2016-09-25 DIAGNOSIS — Z7982 Long term (current) use of aspirin: Secondary | ICD-10-CM

## 2016-09-25 DIAGNOSIS — Z91048 Other nonmedicinal substance allergy status: Secondary | ICD-10-CM

## 2016-09-25 DIAGNOSIS — Z87441 Personal history of nephrotic syndrome: Secondary | ICD-10-CM

## 2016-09-25 DIAGNOSIS — I120 Hypertensive chronic kidney disease with stage 5 chronic kidney disease or end stage renal disease: Secondary | ICD-10-CM | POA: Diagnosis not present

## 2016-09-25 DIAGNOSIS — E1165 Type 2 diabetes mellitus with hyperglycemia: Secondary | ICD-10-CM

## 2016-09-25 DIAGNOSIS — J811 Chronic pulmonary edema: Secondary | ICD-10-CM | POA: Diagnosis present

## 2016-09-25 DIAGNOSIS — E785 Hyperlipidemia, unspecified: Secondary | ICD-10-CM | POA: Diagnosis present

## 2016-09-25 DIAGNOSIS — Z86718 Personal history of other venous thrombosis and embolism: Secondary | ICD-10-CM

## 2016-09-25 DIAGNOSIS — Z6833 Body mass index (BMI) 33.0-33.9, adult: Secondary | ICD-10-CM

## 2016-09-25 DIAGNOSIS — E1121 Type 2 diabetes mellitus with diabetic nephropathy: Secondary | ICD-10-CM | POA: Diagnosis present

## 2016-09-25 DIAGNOSIS — R404 Transient alteration of awareness: Secondary | ICD-10-CM | POA: Diagnosis not present

## 2016-09-25 DIAGNOSIS — N2581 Secondary hyperparathyroidism of renal origin: Secondary | ICD-10-CM | POA: Diagnosis present

## 2016-09-25 DIAGNOSIS — E1110 Type 2 diabetes mellitus with ketoacidosis without coma: Secondary | ICD-10-CM | POA: Diagnosis present

## 2016-09-25 DIAGNOSIS — I4581 Long QT syndrome: Secondary | ICD-10-CM | POA: Diagnosis present

## 2016-09-25 DIAGNOSIS — Z794 Long term (current) use of insulin: Secondary | ICD-10-CM

## 2016-09-25 DIAGNOSIS — N179 Acute kidney failure, unspecified: Secondary | ICD-10-CM | POA: Diagnosis present

## 2016-09-25 DIAGNOSIS — I2510 Atherosclerotic heart disease of native coronary artery without angina pectoris: Secondary | ICD-10-CM | POA: Diagnosis present

## 2016-09-26 ENCOUNTER — Emergency Department (HOSPITAL_COMMUNITY): Payer: Medicare Other

## 2016-09-26 ENCOUNTER — Encounter (HOSPITAL_COMMUNITY): Payer: Self-pay | Admitting: Family Medicine

## 2016-09-26 DIAGNOSIS — R531 Weakness: Secondary | ICD-10-CM | POA: Diagnosis not present

## 2016-09-26 DIAGNOSIS — N186 End stage renal disease: Secondary | ICD-10-CM | POA: Diagnosis not present

## 2016-09-26 DIAGNOSIS — E1165 Type 2 diabetes mellitus with hyperglycemia: Secondary | ICD-10-CM

## 2016-09-26 DIAGNOSIS — D631 Anemia in chronic kidney disease: Secondary | ICD-10-CM | POA: Diagnosis not present

## 2016-09-26 DIAGNOSIS — E1140 Type 2 diabetes mellitus with diabetic neuropathy, unspecified: Secondary | ICD-10-CM | POA: Diagnosis present

## 2016-09-26 DIAGNOSIS — I4581 Long QT syndrome: Secondary | ICD-10-CM | POA: Diagnosis present

## 2016-09-26 DIAGNOSIS — Z992 Dependence on renal dialysis: Secondary | ICD-10-CM | POA: Diagnosis not present

## 2016-09-26 DIAGNOSIS — G8929 Other chronic pain: Secondary | ICD-10-CM | POA: Diagnosis present

## 2016-09-26 DIAGNOSIS — E8770 Fluid overload, unspecified: Secondary | ICD-10-CM | POA: Diagnosis present

## 2016-09-26 DIAGNOSIS — N179 Acute kidney failure, unspecified: Secondary | ICD-10-CM | POA: Diagnosis present

## 2016-09-26 DIAGNOSIS — I2510 Atherosclerotic heart disease of native coronary artery without angina pectoris: Secondary | ICD-10-CM

## 2016-09-26 DIAGNOSIS — I120 Hypertensive chronic kidney disease with stage 5 chronic kidney disease or end stage renal disease: Secondary | ICD-10-CM | POA: Diagnosis present

## 2016-09-26 DIAGNOSIS — J810 Acute pulmonary edema: Secondary | ICD-10-CM | POA: Diagnosis not present

## 2016-09-26 DIAGNOSIS — J45909 Unspecified asthma, uncomplicated: Secondary | ICD-10-CM | POA: Diagnosis present

## 2016-09-26 DIAGNOSIS — R079 Chest pain, unspecified: Secondary | ICD-10-CM | POA: Diagnosis not present

## 2016-09-26 DIAGNOSIS — I10 Essential (primary) hypertension: Secondary | ICD-10-CM

## 2016-09-26 DIAGNOSIS — E1151 Type 2 diabetes mellitus with diabetic peripheral angiopathy without gangrene: Secondary | ICD-10-CM | POA: Diagnosis not present

## 2016-09-26 DIAGNOSIS — E039 Hypothyroidism, unspecified: Secondary | ICD-10-CM | POA: Diagnosis not present

## 2016-09-26 DIAGNOSIS — J811 Chronic pulmonary edema: Secondary | ICD-10-CM | POA: Diagnosis present

## 2016-09-26 DIAGNOSIS — E1110 Type 2 diabetes mellitus with ketoacidosis without coma: Secondary | ICD-10-CM | POA: Diagnosis present

## 2016-09-26 DIAGNOSIS — Z6833 Body mass index (BMI) 33.0-33.9, adult: Secondary | ICD-10-CM | POA: Diagnosis not present

## 2016-09-26 DIAGNOSIS — I509 Heart failure, unspecified: Secondary | ICD-10-CM | POA: Diagnosis not present

## 2016-09-26 DIAGNOSIS — I25110 Atherosclerotic heart disease of native coronary artery with unstable angina pectoris: Secondary | ICD-10-CM | POA: Diagnosis present

## 2016-09-26 DIAGNOSIS — E1122 Type 2 diabetes mellitus with diabetic chronic kidney disease: Secondary | ICD-10-CM | POA: Diagnosis present

## 2016-09-26 DIAGNOSIS — E875 Hyperkalemia: Principal | ICD-10-CM | POA: Diagnosis present

## 2016-09-26 DIAGNOSIS — E1121 Type 2 diabetes mellitus with diabetic nephropathy: Secondary | ICD-10-CM | POA: Diagnosis present

## 2016-09-26 DIAGNOSIS — N2581 Secondary hyperparathyroidism of renal origin: Secondary | ICD-10-CM | POA: Diagnosis present

## 2016-09-26 DIAGNOSIS — E785 Hyperlipidemia, unspecified: Secondary | ICD-10-CM | POA: Diagnosis present

## 2016-09-26 DIAGNOSIS — N19 Unspecified kidney failure: Secondary | ICD-10-CM | POA: Diagnosis not present

## 2016-09-26 DIAGNOSIS — Z9115 Patient's noncompliance with renal dialysis: Secondary | ICD-10-CM | POA: Diagnosis not present

## 2016-09-26 LAB — CBC
HEMATOCRIT: 32.6 % — AB (ref 39.0–52.0)
HEMOGLOBIN: 10.6 g/dL — AB (ref 13.0–17.0)
MCH: 28.4 pg (ref 26.0–34.0)
MCHC: 32.5 g/dL (ref 30.0–36.0)
MCV: 87.4 fL (ref 78.0–100.0)
Platelets: 237 10*3/uL (ref 150–400)
RBC: 3.73 MIL/uL — ABNORMAL LOW (ref 4.22–5.81)
RDW: 17.2 % — ABNORMAL HIGH (ref 11.5–15.5)
WBC: 7.7 10*3/uL (ref 4.0–10.5)

## 2016-09-26 LAB — I-STAT TROPONIN, ED: Troponin i, poc: 0 ng/mL (ref 0.00–0.08)

## 2016-09-26 LAB — BASIC METABOLIC PANEL
ANION GAP: 19 — AB (ref 5–15)
ANION GAP: 20 — AB (ref 5–15)
BUN: 123 mg/dL — ABNORMAL HIGH (ref 6–20)
BUN: 57 mg/dL — ABNORMAL HIGH (ref 6–20)
CALCIUM: 8.4 mg/dL — AB (ref 8.9–10.3)
CO2: 18 mmol/L — ABNORMAL LOW (ref 22–32)
CO2: 24 mmol/L (ref 22–32)
Calcium: 8.7 mg/dL — ABNORMAL LOW (ref 8.9–10.3)
Chloride: 93 mmol/L — ABNORMAL LOW (ref 101–111)
Chloride: 95 mmol/L — ABNORMAL LOW (ref 101–111)
Creatinine, Ser: 15.23 mg/dL — ABNORMAL HIGH (ref 0.61–1.24)
Creatinine, Ser: 8.99 mg/dL — ABNORMAL HIGH (ref 0.61–1.24)
GFR calc Af Amer: 7 mL/min — ABNORMAL LOW (ref >60)
GFR calc non Af Amer: 6 mL/min — ABNORMAL LOW (ref >60)
GFR, EST AFRICAN AMERICAN: 4 mL/min — AB (ref >60)
GFR, EST NON AFRICAN AMERICAN: 3 mL/min — AB (ref >60)
GLUCOSE: 174 mg/dL — AB (ref 65–99)
GLUCOSE: 255 mg/dL — AB (ref 65–99)
Potassium: 4.1 mmol/L (ref 3.5–5.1)
Sodium: 133 mmol/L — ABNORMAL LOW (ref 135–145)
Sodium: 136 mmol/L (ref 135–145)

## 2016-09-26 LAB — CBC WITH DIFFERENTIAL/PLATELET
BASOS ABS: 0 10*3/uL (ref 0.0–0.1)
BASOS PCT: 0 %
EOS ABS: 0 10*3/uL (ref 0.0–0.7)
EOS PCT: 0 %
HCT: 32.9 % — ABNORMAL LOW (ref 39.0–52.0)
HEMOGLOBIN: 10.5 g/dL — AB (ref 13.0–17.0)
LYMPHS ABS: 0.8 10*3/uL (ref 0.7–4.0)
Lymphocytes Relative: 7 %
MCH: 28.4 pg (ref 26.0–34.0)
MCHC: 31.9 g/dL (ref 30.0–36.0)
MCV: 88.9 fL (ref 78.0–100.0)
Monocytes Absolute: 0.5 10*3/uL (ref 0.1–1.0)
Monocytes Relative: 5 %
NEUTROS PCT: 88 %
Neutro Abs: 9.5 10*3/uL — ABNORMAL HIGH (ref 1.7–7.7)
PLATELETS: 293 10*3/uL (ref 150–400)
RBC: 3.7 MIL/uL — AB (ref 4.22–5.81)
RDW: 17.6 % — ABNORMAL HIGH (ref 11.5–15.5)
WBC: 10.8 10*3/uL — ABNORMAL HIGH (ref 4.0–10.5)

## 2016-09-26 LAB — GLUCOSE, CAPILLARY
GLUCOSE-CAPILLARY: 184 mg/dL — AB (ref 65–99)
GLUCOSE-CAPILLARY: 243 mg/dL — AB (ref 65–99)
Glucose-Capillary: 131 mg/dL — ABNORMAL HIGH (ref 65–99)
Glucose-Capillary: 140 mg/dL — ABNORMAL HIGH (ref 65–99)
Glucose-Capillary: 267 mg/dL — ABNORMAL HIGH (ref 65–99)

## 2016-09-26 LAB — POTASSIUM: Potassium: 5.7 mmol/L — ABNORMAL HIGH (ref 3.5–5.1)

## 2016-09-26 LAB — MRSA PCR SCREENING: MRSA by PCR: NEGATIVE

## 2016-09-26 MED ORDER — OXYCODONE-ACETAMINOPHEN 5-325 MG PO TABS
1.0000 | ORAL_TABLET | Freq: Four times a day (QID) | ORAL | Status: DC | PRN
  Administered 2016-09-26 – 2016-09-27 (×3): 1 via ORAL
  Filled 2016-09-26 (×4): qty 1

## 2016-09-26 MED ORDER — SODIUM CHLORIDE 0.9 % IV SOLN: 100.0000 mL | INTRAVENOUS | Status: DC | PRN

## 2016-09-26 MED ORDER — PENTAFLUOROPROP-TETRAFLUOROETH EX AERO: 1.0000 "application " | INHALATION_SPRAY | CUTANEOUS | Status: DC | PRN

## 2016-09-26 MED ORDER — METOPROLOL TARTRATE 100 MG PO TABS
100.0000 mg | ORAL_TABLET | Freq: Two times a day (BID) | ORAL | Status: DC
  Administered 2016-09-26 – 2016-09-28 (×5): 100 mg via ORAL
  Filled 2016-09-26 (×2): qty 1
  Filled 2016-09-26: qty 2
  Filled 2016-09-26: qty 1
  Filled 2016-09-26: qty 2

## 2016-09-26 MED ORDER — CALCIUM ACETATE (PHOS BINDER) 667 MG PO CAPS
2001.0000 mg | ORAL_CAPSULE | Freq: Three times a day (TID) | ORAL | Status: DC
  Administered 2016-09-26 – 2016-09-28 (×4): 2001 mg via ORAL
  Filled 2016-09-26 (×4): qty 3

## 2016-09-26 MED ORDER — LIDOCAINE-PRILOCAINE 2.5-2.5 % EX CREA: 1.0000 "application " | TOPICAL_CREAM | CUTANEOUS | Status: DC | PRN

## 2016-09-26 MED ORDER — INSULIN GLARGINE 100 UNIT/ML ~~LOC~~ SOLN
30.0000 [IU] | Freq: Every day | SUBCUTANEOUS | Status: DC
  Administered 2016-09-26 – 2016-09-28 (×3): 30 [IU] via SUBCUTANEOUS
  Filled 2016-09-26 (×3): qty 0.3

## 2016-09-26 MED ORDER — ALTEPLASE 2 MG IJ SOLR: 2.0000 mg | Freq: Once | INTRAMUSCULAR | Status: DC | PRN

## 2016-09-26 MED ORDER — AMLODIPINE BESYLATE 10 MG PO TABS
10.0000 mg | ORAL_TABLET | ORAL | Status: DC
  Administered 2016-09-26 – 2016-09-28 (×2): 10 mg via ORAL
  Filled 2016-09-26 (×2): qty 1

## 2016-09-26 MED ORDER — ALBUTEROL SULFATE (2.5 MG/3ML) 0.083% IN NEBU
2.5000 mg | INHALATION_SOLUTION | Freq: Once | RESPIRATORY_TRACT | Status: AC
  Administered 2016-09-26: 2.5 mg via RESPIRATORY_TRACT
  Filled 2016-09-26: qty 3

## 2016-09-26 MED ORDER — INSULIN ASPART 100 UNIT/ML IV SOLN
10.0000 [IU] | Freq: Once | INTRAVENOUS | Status: DC
  Filled 2016-09-26 (×2): qty 0.1

## 2016-09-26 MED ORDER — SODIUM CHLORIDE 0.9 % IV SOLN
1.0000 g | Freq: Once | INTRAVENOUS | Status: AC
  Administered 2016-09-26: 1 g via INTRAVENOUS
  Filled 2016-09-26: qty 10

## 2016-09-26 MED ORDER — CALCITRIOL 0.25 MCG PO CAPS
0.2500 ug | ORAL_CAPSULE | ORAL | Status: DC
  Administered 2016-09-27: 0.25 ug via ORAL
  Filled 2016-09-26: qty 1

## 2016-09-26 MED ORDER — ATORVASTATIN CALCIUM 80 MG PO TABS
80.0000 mg | ORAL_TABLET | Freq: Every day | ORAL | Status: DC
  Administered 2016-09-26 – 2016-09-27 (×2): 80 mg via ORAL
  Filled 2016-09-26 (×2): qty 1

## 2016-09-26 MED ORDER — LIDOCAINE HCL (PF) 1 % IJ SOLN: 5.0000 mL | INTRAMUSCULAR | Status: DC | PRN

## 2016-09-26 MED ORDER — ACETAMINOPHEN 325 MG PO TABS
650.0000 mg | ORAL_TABLET | Freq: Four times a day (QID) | ORAL | Status: DC | PRN
  Administered 2016-09-26 – 2016-09-27 (×3): 650 mg via ORAL
  Filled 2016-09-26 (×2): qty 2

## 2016-09-26 MED ORDER — INSULIN ASPART 100 UNIT/ML ~~LOC~~ SOLN
0.0000 [IU] | SUBCUTANEOUS | Status: DC
  Administered 2016-09-26: 5 [IU] via SUBCUTANEOUS
  Administered 2016-09-26: 3 [IU] via SUBCUTANEOUS
  Administered 2016-09-26: 8 [IU] via SUBCUTANEOUS
  Administered 2016-09-27: 11 [IU] via SUBCUTANEOUS
  Administered 2016-09-27 (×2): 3 [IU] via SUBCUTANEOUS
  Administered 2016-09-28 (×2): 2 [IU] via SUBCUTANEOUS

## 2016-09-26 MED ORDER — HEPARIN SODIUM (PORCINE) 1000 UNIT/ML DIALYSIS
1000.0000 [IU] | INTRAMUSCULAR | Status: DC | PRN
  Filled 2016-09-26: qty 1

## 2016-09-26 MED ORDER — ENOXAPARIN SODIUM 30 MG/0.3ML ~~LOC~~ SOLN: 30.0000 mg | SUBCUTANEOUS | Status: DC

## 2016-09-26 MED ORDER — ASPIRIN EC 81 MG PO TBEC
81.0000 mg | DELAYED_RELEASE_TABLET | Freq: Every day | ORAL | Status: DC
  Administered 2016-09-26 – 2016-09-28 (×3): 81 mg via ORAL
  Filled 2016-09-26 (×3): qty 1

## 2016-09-26 MED ORDER — ENOXAPARIN SODIUM 30 MG/0.3ML ~~LOC~~ SOLN
30.0000 mg | SUBCUTANEOUS | Status: DC
  Administered 2016-09-26 – 2016-09-27 (×2): 30 mg via SUBCUTANEOUS
  Filled 2016-09-26 (×4): qty 0.3

## 2016-09-26 MED ORDER — DEXTROSE 50 % IV SOLN
50.0000 mL | Freq: Once | INTRAVENOUS | Status: AC
  Administered 2016-09-26: 50 mL via INTRAVENOUS
  Filled 2016-09-26: qty 50

## 2016-09-26 MED ORDER — LEVOTHYROXINE SODIUM 50 MCG PO TABS
50.0000 ug | ORAL_TABLET | Freq: Every day | ORAL | Status: DC
  Administered 2016-09-26 – 2016-09-28 (×3): 50 ug via ORAL
  Filled 2016-09-26 (×3): qty 1

## 2016-09-26 MED ORDER — CLOPIDOGREL BISULFATE 75 MG PO TABS
75.0000 mg | ORAL_TABLET | Freq: Every day | ORAL | Status: DC
  Administered 2016-09-26 – 2016-09-28 (×3): 75 mg via ORAL
  Filled 2016-09-26 (×3): qty 1

## 2016-09-26 MED ORDER — INSULIN ASPART 100 UNIT/ML ~~LOC~~ SOLN
10.0000 [IU] | Freq: Once | SUBCUTANEOUS | Status: AC
  Administered 2016-09-26: 10 [IU] via INTRAVENOUS
  Filled 2016-09-26: qty 1

## 2016-09-26 MED ORDER — SODIUM BICARBONATE 8.4 % IV SOLN
50.0000 meq | Freq: Once | INTRAVENOUS | Status: AC
  Administered 2016-09-26: 50 meq via INTRAVENOUS
  Filled 2016-09-26: qty 50

## 2016-09-26 MED ORDER — CINACALCET HCL 30 MG PO TABS
30.0000 mg | ORAL_TABLET | ORAL | Status: DC
  Administered 2016-09-27: 30 mg via ORAL
  Filled 2016-09-26: qty 1

## 2016-09-26 MED ORDER — CALCIUM ACETATE (PHOS BINDER) 667 MG PO CAPS: 1334.0000 mg | ORAL_CAPSULE | ORAL | Status: DC | PRN

## 2016-09-26 NOTE — Care Management Note (Signed)
Case Management Note  Patient Details  Name: Russell [name] MRN: [id number] Date of Birth: 23-Jan-1970  Subjective/Objective:          CM following for progression and d/c planning.           Action/Plan: 09/26/2016 Noted pt independent prior to admission able to fly independently to [place] and sightsee becoming weaker due to missing normally scheduled HD treatments. Pt very weak on admission however with HD treatments most likely will be able to return to home without [facility] or DME needs. Will follow for any changes.   Expected Discharge Date:                  Expected Discharge Plan:  Home/Self Care  In-House Referral:  NA  Discharge planning Services  NA  Post Acute Care Choice:  NA Choice offered to:  NA  DME Arranged:    DME Agency:     HH Arranged:    HH Agency:     Status of Service:  In process, will continue to follow  If discussed at Long Length of Stay Meetings, dates discussed:    Additional Comments:  Starlyn Skeans, RN 09/26/2016, 12:05 PM

## 2016-09-26 NOTE — Progress Notes (Signed)
HD tx initiated via 15G x2 w/o problem, pull/push/flush equally w/o problem, VSS w/ increased bp and pt is labored breathing but sats high 90's on 2L [place], will cont to monitor while on HD tx

## 2016-09-26 NOTE — Consult Note (Signed)
Referring Provider: No ref. provider found Primary Care Physician:  [name], MD Primary Nephrologist:  Dr. Marisue Humble  Reason for Consultation:  Medical management of ESRD, hyperkalemia and volume overload  HPI: Russell [name] is a 46 y.o. male with a past medical history significant for ESRD on HD MWF, IDDM, CAD s/p CABG x3 in 2017, HTN, and hypothyroidism who presents with malaise and missed HD. Patient appears to have been traveling to [place] last week and missed his dialysis on Friday.  He became weak and ill on the weekend and was brought to the ER   In the ER he was found to be lethargic and weak, short of breath and with hyperkalemia and EKG changes consistent with his K  He was brought in for emergent dialysis   Past Medical History:  Diagnosis Date  . Anxiety   . Asthma   . 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 Daily headache   . Depression   . DVT (deep venous thrombosis), H/o 01/2014-on Xarelto 03/24/2014   LLE  . ESRD (end stage renal disease) on dialysis [facility])    "[place], MWF" (06/29/2016)  . Hypertension   . Hypothyroidism   . Nephrotic syndrome 05/18/2014  . Type 2 diabetes mellitus with diabetic nephropathy [facility])     Past Surgical History:  Procedure Laterality Date  . ANKLE FRACTURE SURGERY Right 1988  . AV FISTULA PLACEMENT Left 01/01/2015   Procedure: CREATION OF LEFT RADIAL CEPHALIC ARTERIOVENOUS (AV) FISTULA ;  Surgeon: Pryor Ochoa, MD;  Location: [facility] OR;  Service: Vascular;  Laterality: Left;  . CAPD REMOVAL N/A 05/19/2016   Procedure: PD CATH REMOVAL;  Surgeon: Abigail Miyamoto, MD;  Location: [facility] OR;  Service: General;  Laterality: N/A;  . CARDIAC CATHETERIZATION N/A 09/22/2015   Procedure: Left Heart Cath and Coronary Angiography;  Surgeon: Iran Ouch, MD;  Location: [facility] INVASIVE CV LAB;  Service:  Cardiovascular;  Laterality: N/A;  . CORONARY ARTERY BYPASS GRAFT N/A 09/28/2015   Procedure: CORONARY ARTERY BYPASS GRAFTING (CABG) x 3 UTILIZING LEFT MAMMARY ARTERY AND ENDOSCOPICALLY HARVESTED LEFT GREATER SAPHENOUS VEIN.;  Surgeon: Delight Ovens, MD;  Location: [facility] OR;  Service: Open Heart Surgery;  Laterality: N/A;  . ENDOVEIN HARVEST OF GREATER SAPHENOUS VEIN Left 09/28/2015   Procedure: ENDOVEIN HARVEST OF GREATER SAPHENOUS VEIN;  Surgeon: Delight Ovens, MD;  Location: [facility] OR;  Service: Open Heart Surgery;  Laterality: Left;  . ESOPHAGOGASTRODUODENOSCOPY N/A 03/29/2014   Procedure: ESOPHAGOGASTRODUODENOSCOPY (EGD);  Surgeon: Dorena Cookey, MD;  Location: [facility];  Service: Endoscopy;  Laterality: N/A;  . EYE SURGERY    . FRACTURE SURGERY    . INSERTION OF DIALYSIS CATHETER Right 01/05/2015   Procedure: INSERTION OF RIGHT INTERNAL JUGULAR DIALYSIS CATHETER;  Surgeon: Fransisco Hertz, MD;  Location: [facility] OR;  Service: Vascular;  Laterality: Right;  . LEFT HEART CATH AND CORS/GRAFTS ANGIOGRAPHY N/A 09/13/2016   Procedure: LEFT HEART CATH AND CORS/GRAFTS ANGIOGRAPHY;  Surgeon: Kathleene Hazel, MD;  Location: [facility] INVASIVE CV LAB;  Service: Cardiovascular;  Laterality: N/A;  . RETINAL LASER PROCEDURE Bilateral   . TEE WITHOUT CARDIOVERSION N/A 09/28/2015   Procedure: TRANSESOPHAGEAL ECHOCARDIOGRAM (TEE);  Surgeon: Delight Ovens, MD;  Location: [facility] OR;  Service: Open Heart Surgery;  Laterality: N/A;  . TONSILLECTOMY AND ADENOIDECTOMY  1970s    Prior to  Admission medications   Medication Sig Start Date End Date Taking? Authorizing Provider  acetaminophen (TYLENOL) 500 MG tablet Take 1 tablet (500 mg total) by mouth every 6 (six) hours as needed for moderate pain. Patient taking differently: Take 1,000 mg by mouth every 6 (six) hours as needed (for pain).  09/29/14  Yes Albertine Grates, MD  albuterol (PROVENTIL HFA;VENTOLIN HFA) 108 (90 Base) MCG/ACT inhaler Inhale 1 puff into the lungs every 6  (six) hours as needed for wheezing or shortness of breath. Patient taking differently: Inhale 2 puffs into the lungs every 8 (eight) hours as needed for wheezing or shortness of breath.  03/11/15  Yes [name], MD  albuterol (PROVENTIL) (2.5 MG/3ML) 0.083% nebulizer solution Take 3 mLs (2.5 mg total) by nebulization every 4 (four) hours as needed for wheezing. 09/20/16  Yes [name], MD  albuterol-ipratropium (COMBIVENT) 18-103 MCG/ACT inhaler Inhale 2 puffs into the lungs every 4 (four) hours. Patient taking differently: Inhale 2 puffs into the lungs 3 (three) times daily as needed for wheezing or shortness of breath.  09/03/14  Yes [name], MD  amLODipine (NORVASC) 10 MG tablet Take 1 tablet (10 mg total) by mouth daily. DO NOT TAKE ON DIALYSIS DAYS 08/17/16  Yes Lewayne Bunting, MD  aspirin EC 81 MG EC tablet Take 1 tablet (81 mg total) by mouth daily. 10/06/15  Yes Barrett, Erin R, PA-C  atorvastatin (LIPITOR) 80 MG tablet Take 1 tablet (80 mg total) by mouth daily at 6 PM. 04/17/16  Yes Shade Flood, MD  calcitRIOL (ROCALTROL) 0.25 MCG capsule Take 1 capsule (0.25 mcg total) by mouth every Monday, Wednesday, and Friday with hemodialysis. Patient taking differently: Take 0.25 mcg by mouth See admin instructions. Tuesday Thursday Saturday 10/06/15  Yes Barrett, Erin R, PA-C  calcium acetate (PHOSLO) 667 MG capsule Take 2 capsules (1,334 mg total) by mouth 3 (three) times daily with meals. Patient taking differently: Take 1,334-2,001 mg by mouth See admin instructions. Take 2001 mg by mouth 3 times daily with meals and take 1334 mg by mouth with snacks 01/08/15  Yes Rolly Salter, MD  cinacalcet [facility]) 30 MG tablet Take 30 mg by mouth every Monday, Wednesday, and Friday.   Yes [provider]  citalopram (CELEXA) 40 MG tablet Take 40 mg by mouth at bedtime.    Yes [provider]  clonazePAM (KLONOPIN) 2 MG tablet Take 2 mg by mouth daily.   Yes [provider]  clopidogrel (PLAVIX) 75 MG tablet Take 1 tablet (75 mg total) by mouth daily. 01/27/16  Yes Lewayne Bunting, MD  cyclobenzaprine (FLEXERIL) 10 MG tablet Take 1 tablet (10 mg total) by mouth at bedtime. 05/22/15  Yes [name], MD  gabapentin (NEURONTIN) 100 MG capsule Take 2 capsules (200 mg total) by mouth 3 (three) times daily. 05/22/15  Yes [name], MD  HYDROcodone-acetaminophen (NORCO/VICODIN) 5-325 MG tablet Take 1-2 tablets by mouth every 4 (four) hours as needed for moderate pain. 08/17/16  Yes [name], MD  HYDROmorphone (DILAUDID) 2 MG tablet Take 2 mg by mouth 3 (three) times daily.   Yes [provider]  hydrOXYzine (ATARAX/VISTARIL) 25 MG tablet Take 25 mg by mouth 2 (two) times daily as needed for anxiety.    Yes [provider]  Insulin Glargine (BASAGLAR KWIKPEN) 100 UNIT/ML SOPN Inject 5 Units into the skin at bedtime.   Yes [provider]  insulin glargine (LANTUS) 100 UNIT/ML injection Inject 0.4  mLs (40 Units total) into the skin 2 (two) times daily. Patient taking differently: Inject 30 Units into the skin daily.  05/20/16  Yes Dhungel, Nishant, MD  insulin lispro (HUMALOG) 100 UNIT/ML injection Inject 0.15-0.3 mLs (15-30 Units total) into the skin every evening. Take one unit per 5 carbs depending on dinner. 11/29/15  Yes Carlus Pavlov, MD  levothyroxine (SYNTHROID, LEVOTHROID) 50 MCG tablet Take 1 tablet (50 mcg total) by mouth daily before breakfast. 08/17/16  Yes [name], MD  metoCLOPramide (REGLAN) 5 MG tablet Take 1 tablet (5 mg total) by mouth every 8 (eight) hours as needed for nausea or vomiting. Patient taking differently: Take 10 mg by mouth 3 (three) times daily.  01/10/16  Yes Dong, Robin, DO  metoprolol tartrate (LOPRESSOR) 50 MG tablet Take 2 tablets (100 mg total) by mouth 2 (two) times daily. 09/06/16  Yes Lewayne Bunting, MD  pantoprazole (PROTONIX) 40 MG tablet Take 1 tablet (40 mg total) by mouth  daily. 05/25/16 09/26/17 Yes Rolly Salter, MD    Current Facility-Administered Medications  Medication Dose Route Frequency Provider Last Rate Last Dose  . 0.9 %  sodium chloride infusion  100 mL Intravenous PRN Elvis Coil, MD      . 0.9 %  sodium chloride infusion  100 mL Intravenous PRN Elvis Coil, MD      . alteplase (CATHFLO ACTIVASE) injection 2 mg  2 mg Intracatheter Once PRN Elvis Coil, MD      . enoxaparin (LOVENOX) injection 30 mg  30 mg Subcutaneous Q24H Danford, Earl Lites, MD      . heparin injection 1,000 Units  1,000 Units Dialysis PRN Elvis Coil, MD      . lidocaine (PF) (XYLOCAINE) 1 % injection 5 mL  5 mL Intradermal PRN Elvis Coil, MD      . lidocaine-prilocaine (EMLA) cream 1 application  1 application Topical PRN Elvis Coil, MD      . pentafluoroprop-tetrafluoroeth Peggye Pitt) aerosol 1 application  1 application Topical PRN Elvis Coil, MD       Current Outpatient Prescriptions  Medication Sig Dispense Refill  . acetaminophen (TYLENOL) 500 MG tablet Take 1 tablet (500 mg total) by mouth every 6 (six) hours as needed for moderate pain. (Patient taking differently: Take 1,000 mg by mouth every 6 (six) hours as needed (for pain). ) 30 tablet 0  . albuterol (PROVENTIL HFA;VENTOLIN HFA) 108 (90 Base) MCG/ACT inhaler Inhale 1 puff into the lungs every 6 (six) hours as needed for wheezing or shortness of breath. (Patient taking differently: Inhale 2 puffs into the lungs every 8 (eight) hours as needed for wheezing or shortness of breath. ) 1 Inhaler 11  . albuterol (PROVENTIL) (2.5 MG/3ML) 0.083% nebulizer solution Take 3 mLs (2.5 mg total) by nebulization every 4 (four) hours as needed for wheezing. 150 mL 2  . albuterol-ipratropium (COMBIVENT) 18-103 MCG/ACT inhaler Inhale 2 puffs into the lungs every 4 (four) hours. (Patient taking differently: Inhale 2 puffs into the lungs 3 (three) times daily as needed for wheezing or shortness of breath. ) 1 Inhaler 11  .  amLODipine (NORVASC) 10 MG tablet Take 1 tablet (10 mg total) by mouth daily. DO NOT TAKE ON DIALYSIS DAYS 90 tablet 1  . aspirin EC 81 MG EC tablet Take 1 tablet (81 mg total) by mouth daily.    Marland Kitchen atorvastatin (LIPITOR) 80 MG tablet Take 1 tablet (80 mg total) by mouth daily at 6 PM. 90 tablet 1  .  calcitRIOL (ROCALTROL) 0.25 MCG capsule Take 1 capsule (0.25 mcg total) by mouth every Monday, Wednesday, and Friday with hemodialysis. (Patient taking differently: Take 0.25 mcg by mouth See admin instructions. Tuesday Thursday Saturday) 30 capsule 3  . calcium acetate (PHOSLO) 667 MG capsule Take 2 capsules (1,334 mg total) by mouth 3 (three) times daily with meals. (Patient taking differently: Take 1,334-2,001 mg by mouth See admin instructions. Take 2001 mg by mouth 3 times daily with meals and take 1334 mg by mouth with snacks) 180 capsule 0  . cinacalcet (SENSIPAR) 30 MG tablet Take 30 mg by mouth every Monday, Wednesday, and Friday.    . citalopram (CELEXA) 40 MG tablet Take 40 mg by mouth at bedtime.     . clonazePAM (KLONOPIN) 2 MG tablet Take 2 mg by mouth daily.    . clopidogrel (PLAVIX) 75 MG tablet Take 1 tablet (75 mg total) by mouth daily. 90 tablet 3  . cyclobenzaprine (FLEXERIL) 10 MG tablet Take 1 tablet (10 mg total) by mouth at bedtime. 30 tablet 11  . gabapentin (NEURONTIN) 100 MG capsule Take 2 capsules (200 mg total) by mouth 3 (three) times daily. 180 capsule 11  . HYDROcodone-acetaminophen (NORCO/VICODIN) 5-325 MG tablet Take 1-2 tablets by mouth every 4 (four) hours as needed for moderate pain. 60 tablet 0  . HYDROmorphone (DILAUDID) 2 MG tablet Take 2 mg by mouth 3 (three) times daily.    . hydrOXYzine (ATARAX/VISTARIL) 25 MG tablet Take 25 mg by mouth 2 (two) times daily as needed for anxiety.     . Insulin Glargine (BASAGLAR KWIKPEN) 100 UNIT/ML SOPN Inject 5 Units into the skin at bedtime.    . insulin glargine (LANTUS) 100 UNIT/ML injection Inject 0.4 mLs (40 Units total)  into the skin 2 (two) times daily. (Patient taking differently: Inject 30 Units into the skin daily. ) 10 mL 0  . insulin lispro (HUMALOG) 100 UNIT/ML injection Inject 0.15-0.3 mLs (15-30 Units total) into the skin every evening. Take one unit per 5 carbs depending on dinner. 10 mL 2  . levothyroxine (SYNTHROID, LEVOTHROID) 50 MCG tablet Take 1 tablet (50 mcg total) by mouth daily before breakfast. 90 tablet 0  . metoCLOPramide (REGLAN) 5 MG tablet Take 1 tablet (5 mg total) by mouth every 8 (eight) hours as needed for nausea or vomiting. (Patient taking differently: Take 10 mg by mouth 3 (three) times daily. ) 30 tablet 0  . metoprolol tartrate (LOPRESSOR) 50 MG tablet Take 2 tablets (100 mg total) by mouth 2 (two) times daily.    . pantoprazole (PROTONIX) 40 MG tablet Take 1 tablet (40 mg total) by mouth daily. 14 tablet 0    Allergies as of 09/25/2016 - Review Complete 09/13/2016  Allergen Reaction Noted  . Ibuprofen Other (See Comments) 08/04/2014  . Nsaids Other (See Comments) 02/13/2015  . Tape Other (See Comments) 04/27/2016    Family History  Problem Relation Age of Onset  . Obesity Mother        Patient states that family members have no other medical illnesses other than what I have described  . Kidney cancer Maternal Grandmother   . Cancer Father 50       AML  . Heart disease Unknown        No family history    Social History   Social History  . Marital status: Significant Other    Spouse name: N/A  . Number of children: N/A  . Years of education: N/A  Occupational History  .      Maintenance Curator   Social History Main Topics  . Smoking status: Never Smoker  . Smokeless tobacco: Never Used  . Alcohol use 0.0 oz/week     Comment: 06/29/2016 "might have a few drinks/year"  . Drug use: No  . Sexual activity: Yes   Other Topics Concern  . Not on file   Social History Narrative   Patient currently lives with his fiance   Works with facilities    Nonsmoker, nondrinker       Review of Systems: Gen: no fever, no chills,no  sweats, no anorexia, fatigue,+ weakness,+malaise,  HEENT: No visual complaints, No history of Retinopathy. Normal external appearance No Epistaxis or Sore throat. No sinusitis.   CV: Denies chest pain, angina, palpitations, syncope, + orthopnea, PND, no peripheral edema, and claudication. Resp:  + dyspnea at rest, no cough, sputum, wheezing, coughing up blood, and pleurisy. GI: Denies vomiting blood, jaundice, and fecal incontinence.   Denies dysphagia or odynophagia. GU : ESRD MS: Denies joint pain, limitation of movement, and swelling, stiffness, low back pain, extremity pain. Denies muscle weakness, cramps, atrophy.  No use of non steroidal antiinflammatory drugs. Derm: Denies rash, itching, dry skin, hives, moles, warts, or unhealing ulcers.  Psych: Denies depression, anxiety, memory loss, suicidal ideation, hallucinations, paranoia, and confusion. Heme: Denies bruising, bleeding, and enlarged lymph nodes. Neuro: No headache.  No diplopia. No dysarthria.  No dysphasia.  No history of CVA.  No Seizures. No paresthesias.  No weakness. Endocrine DM.  No Thyroid disease.  No Adrenal disease.  Physical Exam: Vital signs in last 24 hours: Temp:  [97.6 F (36.4 C)] 97.6 F (36.4 C) (09/25 0333) Pulse Rate:  [77-89] 89 (09/25 0630) Resp:  [11-19] 12 (09/25 0530) BP: (122-218)/(62-199) 138/85 (09/25 0630) SpO2:  [96 %-100 %] 97 % (09/25 0530) Weight:  [250 lb (113.4 kg)] 250 lb (113.4 kg) (09/25 0000) Last BM Date: 09/25/16 General:   Ill appearing  Breathless using oxygen Head:  Normocephalic and atraumatic. Eyes:  Sclera clear, no icterus.   Conjunctiva pink. Ears:  Normal auditory acuity. Nose:  No deformity, discharge,  or lesions. Mouth:  No deformity or lesions, dentition normal. Neck:  Supple; no masses or thyromegaly. JVP increased Lungs:  Diminished with some basal rales Heart:  Regular rate and  rhythm; no murmurs, clicks, rubs,  or gallops. Abdomen:  Soft, nontender and nondistended. No masses, hepatosplenomegaly or hernias noted. Normal bowel sounds, without guarding, and without rebound.   Msk:  Symmetrical without gross deformities. Normal posture. Pulses:  No carotid, renal, femoral bruits. DP and PT symmetrical and equal Extremities:  Without clubbing or edema. AVF left arm Neurologic:  Alert and  oriented x4;  grossly normal neurologically. Somewhat sluggish Skin:  Intact without significant lesions or rashes. Cervical Nodes:  No significant cervical adenopathy.   Intake/Output from previous day: No intake/output data recorded. Intake/Output this shift: No intake/output data recorded.  Lab Results:  Recent Labs  09/26/16 0001  WBC 10.8*  HGB 10.5*  HCT 32.9*  PLT 293   BMET  Recent Labs  09/26/16 0001 09/26/16 0448  NA 133*  --   K >7.5* 5.7*  CL 95*  --   CO2 18*  --   GLUCOSE 255*  --   BUN 123*  --   CREATININE 15.23*  --   CALCIUM 8.4*  --    LFT No results for input(s): PROT, ALBUMIN, AST, ALT, ALKPHOS, BILITOT, BILIDIR,  IBILI in the last 72 hours. PT/INR No results for input(s): LABPROT, INR in the last 72 hours. Hepatitis Panel No results for input(s): HEPBSAG, HCVAB, HEPAIGM, HEPBIGM in the last 72 hours.  Studies/Results: Dg Chest Port 1 View  Result Date: 09/26/2016 CLINICAL DATA:  Dyspnea this evening EXAM: PORTABLE CHEST 1 VIEW COMPARISON:  08/21/2016 FINDINGS: Cardiomegaly normal abdominal increased interstitial more edema and central vascular congestion. Low lung volumes with bibasilar atelectasis. Median sternotomy sutures are in place. There is aortic atherosclerosis at the arch. No acute nor suspicious osseous abnormalities. IMPRESSION: Stable cardiomegaly with aortic atherosclerosis. Interval development of interstitial edema and mild CHF Electronically Signed   By: Tollie Eth M.D.   On: 09/26/2016 00:40     Assessment/Plan:  ESRD- MWF dialysis [place] missed his dialysis Friday and was admitted with hyperkalemia   ANEMIA- Hb > 10   MBD- continue oral vitamin D  Follow phos and continue outpatient binders  HTN/VOL- Some volume overload with some pulmonary edema noted on X RAY  ACCESS- L cimino fistula that appears to have no issues   Uremia   There appears to be an high BUN and Cr that would suggest that there has been lack of compliance with dialysis  I would be concerned that there is some psychosocial issues that will need exploring prior to discharge. Will need to see social worker and case manager    LOS: 0 Clifford Coudriet W  :45 AM

## 2016-09-26 NOTE — ED Notes (Signed)
Pt cleaned of incontinence, linens changed

## 2016-09-26 NOTE — ED Notes (Signed)
Pt is a NO STICK on the LEFT SIDE

## 2016-09-26 NOTE — Progress Notes (Signed)
Patient was seen and examined at bedside. Patient was admitted early morning. Please see H&P by Dr.Danford for detail.  46 year old male with history of diabetes, coronary artery disease status post CABG, hypertension, hypothyroidism, ESRD on hemodialysis presented with lethargy, dyspnea, chest pain in the setting of missing hemodialysis treatment while visiting to [place]. On arrival patient was hyperkalemic 2 serum potassium level VII.5. Patient received urgent dialysis treatment with serum potassium level improved to 4.1. He feels better. Continue current management. Dialysis related treatment defer to nephrology. Vitals stable, no hypoxia Continue current medication including insulin. Monitor blood sugar level. DVT prophylaxis with low-dose Lovenox.  Lungs bilateral decreased breath sound, patient was alert awake. Bilateral lower extremity pitting edema.

## 2016-09-26 NOTE — H&P (Signed)
History and Physical  Patient Name: Russell [name]     OZH:[id number]    DOB: May 14, 1970    DOA: 09/25/2016 PCP: [name], MD  Nephrology:  Dr. [name]  [place]  Chief Complaint: Chest pain, lethargy, dyspnea, missed HD      HPI: Russell [name] is a 46 y.o. male with a past medical history significant for ESRD on HD MWF, IDDM, CAD s/p CABG x3 in 2017, HTN, and hypothyroidism who presents with malaise and missed HD.  Caveat that all history is collected from wife at bedside as patient is lethargic and minimally responsive.    She reports that he was in his usual state of health until last week.  He dialyzed last Wednesday per schedule, tried to get in an extra session Thursday morning but couldn't, then flew to [place] Thursday evening to see his wife for a surprise (she was out there for a conference).  They drove to [facility] on Friday, and he seemed a little more tired than usual. By Saturday, she noticed he was obviously feeling more sluggish, having to reduce his activities.  By Sunday all day, he could mostly not get out of a chair, seemed just tired, sluggish.  By this morning, he had to be wheeled through the airport in a wheelchair and on arriving to [facility] she immediately called EMS.  ED course: -HR 89, respiraitons 19, BP 170/90, Pulse ox 100% on 2L [place] -Na 133, K >7.5, BUN 123, Cr 15, WBC 10.8K, Hgb 10.5 -Troponin normal -CXR showed edema -ECG showed QRS widening -He was given Bicarb, insulin/D50, and calcium gluc -The case was discussed with Nephrology who recommended as soon as possible HD          ROS: Review of Systems  Unable to perform ROS: Mental status change          Past Medical History:  Diagnosis Date  . Anxiety   . Asthma   . 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 Daily headache   .  Depression   . DVT (deep venous thrombosis), H/o 01/2014-on Xarelto 03/24/2014   LLE  . ESRD (end stage renal disease) on dialysis [facility])    "[place], MWF" (06/29/2016)  . Hypertension   . Hypothyroidism   . Nephrotic syndrome 05/18/2014  . Type 2 diabetes mellitus with diabetic nephropathy [facility])     Past Surgical History:  Procedure Laterality Date  . ANKLE FRACTURE SURGERY Right 1988  . AV FISTULA PLACEMENT Left 01/01/2015   Procedure: CREATION OF LEFT RADIAL CEPHALIC ARTERIOVENOUS (AV) FISTULA ;  Surgeon: Pryor Ochoa, MD;  Location: [facility] OR;  Service: Vascular;  Laterality: Left;  . CAPD REMOVAL N/A 05/19/2016   Procedure: PD CATH REMOVAL;  Surgeon: Abigail Miyamoto, MD;  Location: [facility] OR;  Service: General;  Laterality: N/A;  . CARDIAC CATHETERIZATION N/A 09/22/2015   Procedure: Left Heart Cath and Coronary Angiography;  Surgeon: Iran Ouch, MD;  Location: [facility] INVASIVE CV LAB;  Service: Cardiovascular;  Laterality: N/A;  . CORONARY ARTERY BYPASS GRAFT N/A 09/28/2015   Procedure: CORONARY ARTERY BYPASS GRAFTING (CABG) x 3 UTILIZING LEFT MAMMARY ARTERY AND ENDOSCOPICALLY HARVESTED LEFT GREATER SAPHENOUS VEIN.;  Surgeon: Delight Ovens, MD;  Location: [facility] OR;  Service: Open Heart Surgery;  Laterality: N/A;  . ENDOVEIN HARVEST OF GREATER SAPHENOUS VEIN Left  09/28/2015   Procedure: ENDOVEIN HARVEST OF GREATER SAPHENOUS VEIN;  Surgeon: Delight Ovens, MD;  Location: [facility] OR;  Service: Open Heart Surgery;  Laterality: Left;  . ESOPHAGOGASTRODUODENOSCOPY N/A 03/29/2014   Procedure: ESOPHAGOGASTRODUODENOSCOPY (EGD);  Surgeon: Dorena Cookey, MD;  Location: [facility];  Service: Endoscopy;  Laterality: N/A;  . EYE SURGERY    . FRACTURE SURGERY    . INSERTION OF DIALYSIS CATHETER Right 01/05/2015   Procedure: INSERTION OF RIGHT INTERNAL JUGULAR DIALYSIS CATHETER;  Surgeon: Fransisco Hertz, MD;  Location: [facility] OR;  Service: Vascular;  Laterality: Right;  . LEFT HEART CATH AND CORS/GRAFTS ANGIOGRAPHY  N/A 09/13/2016   Procedure: LEFT HEART CATH AND CORS/GRAFTS ANGIOGRAPHY;  Surgeon: Kathleene Hazel, MD;  Location: [facility] INVASIVE CV LAB;  Service: Cardiovascular;  Laterality: N/A;  . RETINAL LASER PROCEDURE Bilateral   . TEE WITHOUT CARDIOVERSION N/A 09/28/2015   Procedure: TRANSESOPHAGEAL ECHOCARDIOGRAM (TEE);  Surgeon: Delight Ovens, MD;  Location: [facility] OR;  Service: Open Heart Surgery;  Laterality: N/A;  . TONSILLECTOMY AND ADENOIDECTOMY  1970s    Social History: Patient lives with his wife.  The patient walks unassisted.  Nonsmoker.  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  No alcohol.  Allergies  Allergen Reactions  . Ibuprofen Other (See Comments)    MD told patient not to take due to kidney disease  . Nsaids Other (See Comments)    Told to avoid all nsaids due to kidney disease   . Tape Other (See Comments)    Welts result, if left for a long amount of time    Family history: family history includes Cancer (age of onset: 72) in his father; Heart disease in his unknown relative; Kidney cancer in his maternal grandmother; Obesity in his mother.  Prior to Admission medications   Medication Sig Start Date End Date Taking? Authorizing Provider  acetaminophen (TYLENOL) 500 MG tablet Take 1 tablet (500 mg total) by mouth every 6 (six) hours as needed for moderate pain. Patient taking differently: Take 1,000 mg by mouth every 6 (six) hours as needed (for pain).  09/29/14  Yes Albertine Grates, MD  albuterol (PROVENTIL HFA;VENTOLIN HFA) 108 (90 Base) MCG/ACT inhaler Inhale 1 puff into the lungs every 6 (six) hours as needed for wheezing or shortness of breath. Patient taking differently: Inhale 2 puffs into the lungs every 8 (eight) hours as needed for wheezing or shortness of breath.  03/11/15  Yes [name], MD  albuterol (PROVENTIL) (2.5 MG/3ML) 0.083% nebulizer solution Take 3 mLs (2.5 mg total) by nebulization every 4 (four) hours as needed for wheezing. 09/20/16  Yes  [name], MD  albuterol-ipratropium (COMBIVENT) 18-103 MCG/ACT inhaler Inhale 2 puffs into the lungs every 4 (four) hours. Patient taking differently: Inhale 2 puffs into the lungs 3 (three) times daily as needed for wheezing or shortness of breath.  09/03/14  Yes [name], MD  amLODipine (NORVASC) 10 MG tablet Take 1 tablet (10 mg total) by mouth daily. DO NOT TAKE ON DIALYSIS DAYS 08/17/16  Yes Lewayne Bunting, MD  aspirin EC 81 MG EC tablet Take 1 tablet (81 mg total) by mouth daily. 10/06/15  Yes Barrett, Erin R, PA-C  atorvastatin (LIPITOR) 80 MG tablet Take 1 tablet (80 mg total) by mouth daily at 6 PM. 04/17/16  Yes Shade Flood, MD  calcitRIOL (ROCALTROL) 0.25 MCG capsule Take 1 capsule (0.25 mcg total) by mouth every Monday, Wednesday, and Friday with hemodialysis. Patient taking  differently: Take 0.25 mcg by mouth See admin instructions. Tuesday Thursday Saturday 10/06/15  Yes Barrett, Erin R, PA-C  calcium acetate (PHOSLO) 667 MG capsule Take 2 capsules (1,334 mg total) by mouth 3 (three) times daily with meals. Patient taking differently: Take 1,334-2,001 mg by mouth See admin instructions. Take 2001 mg by mouth 3 times daily with meals and take 1334 mg by mouth with snacks 01/08/15  Yes Rolly Salter, MD  cinacalcet [facility]) 30 MG tablet Take 30 mg by mouth every Monday, Wednesday, and Friday.   Yes [provider]  citalopram (CELEXA) 40 MG tablet Take 40 mg by mouth at bedtime.    Yes [provider]  clonazePAM (KLONOPIN) 2 MG tablet Take 2 mg by mouth daily.   Yes [provider]  clopidogrel (PLAVIX) 75 MG tablet Take 1 tablet (75 mg total) by mouth daily. 01/27/16  Yes Lewayne Bunting, MD  cyclobenzaprine (FLEXERIL) 10 MG tablet Take 1 tablet (10 mg total) by mouth at bedtime. 05/22/15  Yes [name], MD  gabapentin (NEURONTIN) 100 MG capsule Take 2 capsules (200 mg total) by mouth 3 (three) times daily. 05/22/15  Yes [name],  MD  HYDROcodone-acetaminophen (NORCO/VICODIN) 5-325 MG tablet Take 1-2 tablets by mouth every 4 (four) hours as needed for moderate pain. 08/17/16  Yes [name], MD  HYDROmorphone (DILAUDID) 2 MG tablet Take 2 mg by mouth 3 (three) times daily.   Yes [provider]  hydrOXYzine (ATARAX/VISTARIL) 25 MG tablet Take 25 mg by mouth 2 (two) times daily as needed for anxiety.    Yes [provider]  Insulin Glargine (BASAGLAR KWIKPEN) 100 UNIT/ML SOPN Inject 5 Units into the skin at bedtime.   Yes [provider]  insulin glargine (LANTUS) 100 UNIT/ML injection Inject 0.4 mLs (40 Units total) into the skin 2 (two) times daily. Patient taking differently: Inject 30 Units into the skin daily.  05/20/16  Yes Dhungel, Nishant, MD  insulin lispro (HUMALOG) 100 UNIT/ML injection Inject 0.15-0.3 mLs (15-30 Units total) into the skin every evening. Take one unit per 5 carbs depending on dinner. 11/29/15  Yes Carlus Pavlov, MD  levothyroxine (SYNTHROID, LEVOTHROID) 50 MCG tablet Take 1 tablet (50 mcg total) by mouth daily before breakfast. 08/17/16  Yes [name], MD  metoCLOPramide (REGLAN) 5 MG tablet Take 1 tablet (5 mg total) by mouth every 8 (eight) hours as needed for nausea or vomiting. Patient taking differently: Take 10 mg by mouth 3 (three) times daily.  01/10/16  Yes Dong, Robin, DO  metoprolol tartrate (LOPRESSOR) 50 MG tablet Take 2 tablets (100 mg total) by mouth 2 (two) times daily. 09/06/16  Yes Lewayne Bunting, MD  pantoprazole (PROTONIX) 40 MG tablet Take 1 tablet (40 mg total) by mouth daily. 05/25/16 09/26/17 Yes Rolly Salter, MD       Physical Exam: BP (!) 180/123   Pulse 79   Resp 11   Ht  (1.88 m)   Wt 113.4 kg (250 lb)   SpO2 100%   BMI 32.10 kg/m  General appearance: Well-developed, obese adult male, sluggish but rousbale with sternal rub and oriented to situation.  Appears in moderate distress.  Eyes: Anicteric, conjunctiva pink, lids and  lashes normal. PERRL.    ENT: No nasal deformity, discharge, epistaxis.  Hearing normal. OP moist without lesions.   Neck: No neck masses.  Trachea midline.  No thyromegaly/tenderness. Lymph: No cervical or supraclavicular lymphadenopathy. Skin: Warm and dry.  No jaundice.  No suspicious rashes or lesions. Cardiac: Tachycardic, regular, nl S1-S2, no murmurs appreciated.  Capillary refill is brisk.  JVP not visible.  Trace LE edema.  Radial pulses 2+ and symmetric. Respiratory: Tachypneic, labored.  No rales appreciated. Abdomen: Abdomen soft.  No TTP. No ascites, distension, hepatosplenomegaly.   MSK: No deformities or effusions.  No cyanosis or clubbing. Neuro: PERRL.  Face symmetric.  Voice normal. Speech is fluent.  Muscle strength 4/5 but symmetric.    Psych: Sensorium intact and responding to questions, attention diminished but rouses to stimuli.       Labs on Admission:  I have personally reviewed following labs and imaging studies: CBC:  Recent Labs Lab 09/26/16 0001  WBC 10.8*  NEUTROABS 9.5*  HGB 10.5*  HCT 32.9*  MCV 88.9  PLT 293   Basic Metabolic Panel:  Recent Labs Lab 09/26/16 0001  NA 133*  K >7.5*  CL 95*  CO2 18*  GLUCOSE 255*  BUN 123*  CREATININE 15.23*  CALCIUM 8.4*   GFR: Estimated Creatinine Clearance: 8.2 mL/min (A) (by C-G formula based on SCr of 15.23 mg/dL (H)).  Liver Function Tests: No results for input(s): AST, ALT, ALKPHOS, BILITOT, PROT, ALBUMIN in the last 168 hours. No results for input(s): LIPASE, AMYLASE in the last 168 hours. No results for input(s): AMMONIA in the last 168 hours. Coagulation Profile: No results for input(s): INR, PROTIME in the last 168 hours. Cardiac Enzymes: No results for input(s): CKTOTAL, CKMB, CKMBINDEX, TROPONINI in the last 168 hours. BNP (last 3 results) No results for input(s): PROBNP in the last 8760 hours. HbA1C: No results for input(s): HGBA1C in the last 72 hours. CBG: No results for  input(s): GLUCAP in the last 168 hours. Lipid Profile: No results for input(s): CHOL, HDL, LDLCALC, TRIG, CHOLHDL, LDLDIRECT in the last 72 hours. Thyroid Function Tests: No results for input(s): TSH, T4TOTAL, FREET4, T3FREE, THYROIDAB in the last 72 hours. Anemia Panel: No results for input(s): VITAMINB12, FOLATE, FERRITIN, TIBC, IRON, RETICCTPCT in the last 72 hours. Sepsis Labs: Invalid input(s): PROCALCITONIN, LACTICIDVEN No results found for this or any previous visit (from the past 240 hour(s)).       Radiological Exams on Admission: Personally reviewed CXR shows edema: Dg Chest Port 1 View  Result Date: 09/26/2016 CLINICAL DATA:  Dyspnea this evening EXAM: PORTABLE CHEST 1 VIEW COMPARISON:  08/21/2016 FINDINGS: Cardiomegaly normal abdominal increased interstitial more edema and central vascular congestion. Low lung volumes with bibasilar atelectasis. Median sternotomy sutures are in place. There is aortic atherosclerosis at the arch. No acute nor suspicious osseous abnormalities. IMPRESSION: Stable cardiomegaly with aortic atherosclerosis. Interval development of interstitial edema and mild CHF Electronically Signed   By: Tollie Eth M.D.   On: 09/26/2016 00:40    EKG: Independently reviewed. Initially shows widened QRS in a junctional rhythm.  Repeat after temporizing treatments shows normalized QRS, peaked T waves.         Assessment/Plan Principal Problem:   Hyperkalemia Active Problems:   Hypothyroidism (acquired)   Chest pain   Uncontrolled diabetes mellitus type 2 with peripheral artery disease (HCC)   Morbid obesity (HCC)   Essential hypertension, benign   ESRD on dialysis [facility])   Coronary artery disease involving native coronary artery of native heart without angina pectoris   Anemia due to end stage renal disease (HCC)  1. Hyperkalemia and pulmonary edema:    -Urgent HD, per Nephrology -Discussed with HD nurse, will get HD within an hour, given  he is  saturating well, will defer BiPAP for now -Continue calcitriol, sensipar, phoslo   2. Hypertension and CAD secondary prevention:  -HD to treat HTN, per Nephrology -Continue amlodipine, statin, BB, aspirin and Plavix  3. Diabetes:  Wife uncertain what he takes for diabtes.  Suspect his elevated anion gap is from AKI more than DKA, but will monitor glucose q4hrs overnight  -Glargine 30 units daily -SSI q4hrs tonight, transtiion to [facility] tomorrow if stable  4. Chronic pain:  -Hold Dilaudid and all sedating meds (Klonopin, gabapentin) until mentation clearer  5. Prolonged QTc:  From hyperK -HD per Nephrology -Monitor on Tele -Hold Celexa for now  6. Hypothyroidism:  -Continue levothyroxine     DVT prophylaxis: Low dose lovenox  Code Status: FULL  Family Communication: Wife at ebdside  Disposition Plan: Anticipate urgent HD tonight. Consults called: Nephrology, Dr. Hyman Hopes Admission status: INPATIENT    Medical decision making: Patient seen at 1:30 AM on 09/26/2016.  The patient was discussed with Dr. Preston Fleeting.  What exists of the patient's chart was reviewed in depth and summarized above.  Clinical condition: HR elevated, and respirations elevated, but QRS normalized with temporizing measures, O2 sat good on supplemental O2 by  and dialysis imminent.  Overall condition gaurded.        Alberteen Sam [facility] Pager [phone]         At the time of admission, it appears that the appropriate admission status for this patient is INPATIENT. This is judged to be reasonable and necessary in order to provide the required intensity of service to ensure the patient's safety given the presenting symptoms, physical exam findings, and initial radiographic and laboratory data in the context of their chronic comorbidities.  Together, these circumstances are felt to place him at high risk for further clinical deterioration threatening life, limb, or organ.   Patient  requires inpatient status due to high intensity of service, high risk for further deterioration and high frequency of surveillance required because of this severe exacerbation of their chronic organ failure.  I certify that at the point of admission it is my clinical judgment that the patient will require inpatient hospital care spanning beyond 2 midnights from the point of admission and that early discharge would result in unnecessary risk of decompensation and readmission or threat to life, limb or bodily function.

## 2016-09-26 NOTE — ED Triage Notes (Signed)
Per ems, pt is on dialysis and was out of town missing 2 treatments today on the flight pt became weak and short of breath. Present to the ED on a NRB 100%

## 2016-09-26 NOTE — ED Provider Notes (Signed)
[facility]-EMERGENCY DEPT Provider Note   CSN: [id number] Arrival date & time: 09/25/16  2354     History   Chief Complaint Chief Complaint  Patient presents with  . Respiratory Distress    HPI Russell [name] is a 46 y.o. male.  The history is provided by the EMS personnel. The history is limited by the condition of the patient (Severe illness, not able to give history).  He has a history of end-stage renal disease, and was out of town and missed his last 2 dialysis sessions. Last time he was dialyzed was 5 days ago. He was flying back into town today while when he became weak and short of breath. He denies chest pain. He is complaining of generalized weakness.  Past Medical History:  Diagnosis Date  . Anxiety   . Asthma   . 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 Daily headache   . Depression   . DVT (deep venous thrombosis), H/o 01/2014-on Xarelto 03/24/2014   LLE  . ESRD (end stage renal disease) on dialysis [facility])    "[place], MWF" (06/29/2016)  . Hypertension   . Hypothyroidism   . Nephrotic syndrome 05/18/2014  . Type 2 diabetes mellitus with diabetic nephropathy [facility])     Patient Active Problem List   Diagnosis Date Noted  . History of DVT of lower extremity 08/17/2016  . Arteriosclerotic vascular disease 08/17/2016  . DKA, type 1 (HCC) 06/29/2016  . Nausea vomiting and diarrhea 06/29/2016  . Malignant hypertension 06/29/2016  . Chronic respiratory failure (HCC) 05/16/2016  . Anemia due to end stage renal disease (HCC) 05/16/2016  . S/P CABG (coronary artery bypass graft) 09/28/2015  . Coronary artery disease involving native coronary artery of native heart without angina pectoris   . ESRD on dialysis (HCC) 02/13/2015  . Nephrotic syndrome 01/03/2015  . Essential hypertension, benign   . Diabetic ketoacidosis without coma  associated with type 2 diabetes mellitus (HCC)   . Thyroid activity decreased   . Hx of gastroesophageal reflux (GERD) 09/30/2014  . Diabetic neuropathy (HCC) 09/21/2014  . Uncontrolled diabetes mellitus type 2 with peripheral artery disease (HCC) 09/21/2014  . Morbid obesity (HCC) 09/21/2014  . Chest pain 04/22/2014  . Superficial thrombophlebitis 03/24/2014    Past Surgical History:  Procedure Laterality Date  . ANKLE FRACTURE SURGERY Right 1988  . AV FISTULA PLACEMENT Left 01/01/2015   Procedure: CREATION OF LEFT RADIAL CEPHALIC ARTERIOVENOUS (AV) FISTULA ;  Surgeon: Pryor Ochoa, MD;  Location: [facility] OR;  Service: Vascular;  Laterality: Left;  . CAPD REMOVAL N/A 05/19/2016   Procedure: PD CATH REMOVAL;  Surgeon: Abigail Miyamoto, MD;  Location: [facility] OR;  Service: General;  Laterality: N/A;  . CARDIAC CATHETERIZATION N/A 09/22/2015   Procedure: Left Heart Cath and Coronary Angiography;  Surgeon: Iran Ouch, MD;  Location: [facility] INVASIVE CV LAB;  Service: Cardiovascular;  Laterality: N/A;  . CORONARY ARTERY BYPASS GRAFT N/A 09/28/2015   Procedure: CORONARY ARTERY BYPASS GRAFTING (CABG) x 3 UTILIZING LEFT MAMMARY ARTERY AND ENDOSCOPICALLY HARVESTED LEFT GREATER SAPHENOUS VEIN.;  Surgeon: Delight Ovens, MD;  Location: [facility] OR;  Service: Open Heart Surgery;  Laterality: N/A;  . ENDOVEIN HARVEST OF GREATER SAPHENOUS VEIN Left 09/28/2015   Procedure: ENDOVEIN HARVEST OF GREATER SAPHENOUS VEIN;  Surgeon: Delight Ovens, MD;  Location: [facility] OR;  Service: Open Heart  Surgery;  Laterality: Left;  . ESOPHAGOGASTRODUODENOSCOPY N/A 03/29/2014   Procedure: ESOPHAGOGASTRODUODENOSCOPY (EGD);  Surgeon: Dorena Cookey, MD;  Location: [facility];  Service: Endoscopy;  Laterality: N/A;  . EYE SURGERY    . FRACTURE SURGERY    . INSERTION OF DIALYSIS CATHETER Right 01/05/2015   Procedure: INSERTION OF RIGHT INTERNAL JUGULAR DIALYSIS CATHETER;  Surgeon: Fransisco Hertz, MD;  Location: [facility] OR;  Service: Vascular;   Laterality: Right;  . LEFT HEART CATH AND CORS/GRAFTS ANGIOGRAPHY N/A 09/13/2016   Procedure: LEFT HEART CATH AND CORS/GRAFTS ANGIOGRAPHY;  Surgeon: Kathleene Hazel, MD;  Location: [facility] INVASIVE CV LAB;  Service: Cardiovascular;  Laterality: N/A;  . RETINAL LASER PROCEDURE Bilateral   . TEE WITHOUT CARDIOVERSION N/A 09/28/2015   Procedure: TRANSESOPHAGEAL ECHOCARDIOGRAM (TEE);  Surgeon: Delight Ovens, MD;  Location: [facility] OR;  Service: Open Heart Surgery;  Laterality: N/A;  . TONSILLECTOMY AND ADENOIDECTOMY  1970s       Home Medications    Prior to Admission medications   Medication Sig Start Date End Date Taking? Authorizing Provider  acetaminophen (TYLENOL) 500 MG tablet Take 1 tablet (500 mg total) by mouth every 6 (six) hours as needed for moderate pain. Patient taking differently: Take 1,000 mg by mouth every 6 (six) hours as needed (for pain).  09/29/14   Albertine Grates, MD  albuterol (PROVENTIL HFA;VENTOLIN HFA) 108 (90 Base) MCG/ACT inhaler Inhale 1 puff into the lungs every 6 (six) hours as needed for wheezing or shortness of breath. Patient taking differently: Inhale 2 puffs into the lungs every 8 (eight) hours as needed for wheezing or shortness of breath.  03/11/15   [name], MD  albuterol (PROVENTIL) (2.5 MG/3ML) 0.083% nebulizer solution Take 3 mLs (2.5 mg total) by nebulization every 4 (four) hours as needed for wheezing. 09/20/16   [name], MD  albuterol-ipratropium (COMBIVENT) [phone] MCG/ACT inhaler Inhale 2 puffs into the lungs every 4 (four) hours. Patient taking differently: Inhale 2 puffs into the lungs 3 (three) times daily as needed for wheezing or shortness of breath.  09/03/14   [name], MD  amLODipine (NORVASC) 10 MG tablet Take 1 tablet (10 mg total) by mouth daily. DO NOT TAKE ON DIALYSIS DAYS 08/17/16   Lewayne Bunting, MD  aspirin EC 81 MG EC tablet Take 1 tablet (81 mg total) by mouth daily. 10/06/15   Barrett, Erin R, PA-C  atorvastatin  (LIPITOR) 80 MG tablet Take 1 tablet (80 mg total) by mouth daily at 6 PM. 04/17/16   Shade Flood, MD  calcitRIOL (ROCALTROL) 0.25 MCG capsule Take 1 capsule (0.25 mcg total) by mouth every Monday, Wednesday, and Friday with hemodialysis. Patient taking differently: Take 0.25 mcg by mouth See admin instructions. Tuesday Thursday Saturday 10/06/15   Barrett, Rae Roam, PA-C  calcium acetate (PHOSLO) 667 MG capsule Take 2 capsules (1,334 mg total) by mouth 3 (three) times daily with meals. Patient taking differently: Take 1,334-2,001 mg by mouth See admin instructions. Take 2001 mg by mouth 3 times daily with meals and take 1334 mg by mouth with snacks 01/08/15   Rolly Salter, MD  cinacalcet [facility]) 30 MG tablet Take 30 mg by mouth every Monday, Wednesday, and Friday.    [provider]  citalopram (CELEXA) 40 MG tablet Take 40 mg by mouth at bedtime.     [provider]  clonazePAM (KLONOPIN) 2 MG tablet Take 2 mg by mouth daily.    [provider]  clopidogrel (PLAVIX)  75 MG tablet Take 1 tablet (75 mg total) by mouth daily. 01/27/16   Lewayne Bunting, MD  cyclobenzaprine (FLEXERIL) 10 MG tablet Take 1 tablet (10 mg total) by mouth at bedtime. 05/22/15   [name], MD  gabapentin (NEURONTIN) 100 MG capsule Take 2 capsules (200 mg total) by mouth 3 (three) times daily. 05/22/15   [name], MD  hydrALAZINE (APRESOLINE) 25 MG tablet Take 25 mg three times a day  Hold morning dose on dialysis days Patient not taking: Reported on 09/13/2016 09/12/16   Azalee Course, PA  HYDROcodone-acetaminophen (NORCO/VICODIN) 5-325 MG tablet Take 1-2 tablets by mouth every 4 (four) hours as needed for moderate pain. 08/17/16   [name], MD  HYDROmorphone (DILAUDID) 2 MG tablet Take 2 mg by mouth 3 (three) times daily.    [provider]  hydrOXYzine (ATARAX/VISTARIL) 25 MG tablet Take 25 mg by mouth 2 (two) times daily as needed for anxiety.     [provider]    Insulin Glargine (BASAGLAR KWIKPEN) 100 UNIT/ML SOPN Inject 5 Units into the skin at bedtime.    [provider]  insulin glargine (LANTUS) 100 UNIT/ML injection Inject 0.4 mLs (40 Units total) into the skin 2 (two) times daily. Patient taking differently: Inject 30 Units into the skin daily.  05/20/16   Dhungel, Nishant, MD  insulin lispro (HUMALOG) 100 UNIT/ML injection Inject 0.15-0.3 mLs (15-30 Units total) into the skin every evening. Take one unit per 5 carbs depending on dinner. 11/29/15   Carlus Pavlov, MD  levothyroxine (SYNTHROID, LEVOTHROID) 50 MCG tablet Take 1 tablet (50 mcg total) by mouth daily before breakfast. 08/17/16   [name], MD  metoCLOPramide (REGLAN) 5 MG tablet Take 1 tablet (5 mg total) by mouth every 8 (eight) hours as needed for nausea or vomiting. Patient taking differently: Take 10 mg by mouth 3 (three) times daily. Pt reports he takes two 5 mg tablets TID 01/10/16   Dwana Melena, DO  metoprolol tartrate (LOPRESSOR) 50 MG tablet Take 2 tablets (100 mg total) by mouth 2 (two) times daily. 09/06/16   Lewayne Bunting, MD  pantoprazole (PROTONIX) 40 MG tablet Take 1 tablet (40 mg total) by mouth daily. 05/25/16 09/13/16  Rolly Salter, MD    Family History Family History  Problem Relation Age of Onset  . Obesity Mother        Patient states that family members have no other medical illnesses other than what I have described  . Kidney cancer Maternal Grandmother   . Cancer Father 50       AML  . Heart disease Unknown        No family history    Social History Social History  Substance Use Topics  . Smoking status: Never Smoker  . Smokeless tobacco: Never Used  . Alcohol use 0.0 oz/week     Comment: 06/29/2016 "might have a few drinks/year"     Allergies   Ibuprofen; Nsaids; and Tape   Review of Systems Review of Systems  All other systems reviewed and are negative.    Physical Exam Updated Vital Signs BP (!) 218/199   Pulse 79    Resp 15   Ht 6\' 2"  (1.88 m)   Wt 113.4 kg (250 lb)   SpO2 100%   BMI 32.10 kg/m   Physical Exam  Nursing note and vitals reviewed.  46 year old male, appears dyspneic and uncomfortable, but is no acute distress. Vital signs are  significant for hypertension. Oxygen saturation is 100%, which is normal. Head is normocephalic and atraumatic. PERRLA, EOMI. Oropharynx is clear. Neck is nontender and supple without adenopathy or JVD. Back is nontender and there is no CVA tenderness. Lungs are clear without rales, wheezes, or rhonchi. Chest is nontender. Heart has regular rate and rhythm without murmur. Abdomen is soft, flat, nontender without masses or hepatosplenomegaly and peristalsis is normoactive. Extremities have 1+ edema, full range of motion is present. AV fistula is present in the left forearm with thrill present. Skin is warm and dry without rash. Neurologic: Mental status is normal, cranial nerves are intact. He has moderate to severe generalized weakness, no focal weakness.  ED Treatments / Results  Labs (all labs ordered are listed, but only abnormal results are displayed) Labs Reviewed  BASIC METABOLIC PANEL - Abnormal; Notable for the following:       Result Value   Sodium 133 (*)    Potassium >7.5 (*)    Chloride 95 (*)    CO2 18 (*)    Glucose, Bld 255 (*)    BUN 123 (*)    Creatinine, Ser 15.23 (*)    Calcium 8.4 (*)    GFR calc non Af Amer 3 (*)    GFR calc Af Amer 4 (*)    Anion gap 20 (*)    All other components within normal limits  CBC WITH DIFFERENTIAL/PLATELET - Abnormal; Notable for the following:    WBC 10.8 (*)    RBC 3.70 (*)    Hemoglobin 10.5 (*)    HCT 32.9 (*)    RDW 17.6 (*)    Neutro Abs 9.5 (*)    All other components within normal limits  I-STAT TROPONIN, ED    EKG  EKG Interpretation  Date/Time:  Monday September 25 2016 23:55:07 EDT Ventricular Rate:  92 PR Interval:    QRS Duration: 160 QT Interval:  409 QTC  Calculation: 506 R Axis:   -116 Text Interpretation:  Accelerated junctional rhythm Nonspecific IVCD with LAD Anterior infarct, old Repol abnrm, severe global ischemia (LM/MVD) When compared with ECG of 06/29/2016, Non-specific intra-ventricular conduction delay is now present REPOLARIZATION ABNORMALITY is now present Confirmed by Dione Booze ([id number]) on 09/26/2016 12:02:05 AM       EKG Interpretation  Date/Time:  Tuesday September 26 2016 00:52:01 EDT Ventricular Rate:  81 PR Interval:    QRS Duration: 182 QT Interval:  492 QTC Calculation: 572 R Axis:   150 Text Interpretation:  Sinus rhythm Prolonged PR interval Nonspecific intraventricular conduction delay Probable anteroseptal infarct, recent Lateral leads are also involved When compared with ECG of 09/25/2016, QRS widening has decreased, but ECG changes of hyperkalemia are still present Confirmed by Dione Booze ([id number]) on 09/26/2016 1:00:01 AM       EKG Interpretation  Date/Time:  Tuesday September 26 2016 01:11:31 EDT Ventricular Rate:  80 PR Interval:    QRS Duration: 145 QT Interval:  455 QTC Calculation: 525 R Axis:   65 Text Interpretation:  Sinus rhythm Prolonged PR interval Nonspecific intraventricular conduction delay Anterior infarct, old When compared with ECG of EARLIER SAME DATE there has been further improvement in changes of hyperkalemia  Confirmed by Dione Booze ([id number]) on 09/26/2016 1:44:46 AM       Radiology Dg Chest Port 1 View  Result Date: 09/26/2016 CLINICAL DATA:  Dyspnea this evening EXAM: PORTABLE CHEST 1 VIEW COMPARISON:  08/21/2016 FINDINGS: Cardiomegaly normal abdominal increased interstitial more edema and central vascular congestion.  Low lung volumes with bibasilar atelectasis. Median sternotomy sutures are in place. There is aortic atherosclerosis at the arch. No acute nor suspicious osseous abnormalities. IMPRESSION: Stable cardiomegaly with aortic atherosclerosis. Interval development of  interstitial edema and mild CHF Electronically Signed   By: Tollie Eth M.D.   On: 09/26/2016 00:40    Procedures Procedures (including critical care time) CRITICAL CARE Performed by: UJWJX,BJYNW Total critical care time: 85 minutes Critical care time was exclusive of separately billable procedures and treating other patients. Critical care was necessary to treat or prevent imminent or life-threatening deterioration. Critical care was time spent personally by me on the following activities: development of treatment plan with patient and/or surrogate as well as nursing, discussions with consultants, evaluation of patient's response to treatment, examination of patient, obtaining history from patient or surrogate, ordering and performing treatments and interventions, ordering and review of laboratory studies, ordering and review of radiographic studies, pulse oximetry and re-evaluation of patient's condition.   Medications Ordered in ED Medications  calcium gluconate 1 g in sodium chloride 0.9 % 100 mL IVPB (not administered)  sodium bicarbonate injection 50 mEq (not administered)  dextrose 50 % solution 50 mL (not administered)  insulin aspart (novoLOG) injection 10 Units (not administered)  albuterol (PROVENTIL) (2.5 MG/3ML) 0.083% nebulizer solution 2.5 mg (not administered)     Initial Impression / Assessment and Plan / ED Course  I have reviewed the triage vital signs and the nursing notes.  Pertinent labs & imaging results that were available during my care of the patient were reviewed by me and considered in my medical decision making (see chart for details).  Weakness and dyspnea in a dialysis patient. ECG is worrisome for hyperkalemia. Emergent treatment for hyperkalemia is initiated with calcium, sodium bicarbonate, glucose, insulin, albuterol. Screening labs are sent and chest x-ray ordered. Old records are reviewed showing recent cardiac catheterization showing three-vessel CAD  with patent grafts.  Laboratory workup confirms severe hyperkalemia with potassium greater than 7.5. QRS has narrowed with above noted treatment. Repeat ECG is obtained. Case is discussed with Dr. Hyman Hopes of nephrology service who agrees to dialyze the patient emergently. He also requests patient be admitted under hospitalist service. Case is discussed with Dr. Maryfrances Bunnell of triad hospitalists who agreed to admit the patient.  Final Clinical Impressions(s) / ED Diagnoses   Final diagnoses:  Hyperkalemia  Acute pulmonary edema (HCC)  End-stage renal disease on hemodialysis (HCC)  Anemia in chronic kidney disease, on chronic dialysis [facility])    New Prescriptions New Prescriptions   No medications on file     Dione Booze, MD 09/26/16 0145

## 2016-09-26 NOTE — Plan of Care (Signed)
Problem: Safety: Goal: Ability to remain free from injury will improve Outcome: Progressing Patient aware of need to use call bell in order to get out of bed.

## 2016-09-26 NOTE — Care Management Note (Signed)
Case Management Note  Patient Details  Name: Russell [name] MRN: [id number] Date of Birth: [birth year]  Subjective/Objective:      CM following for progression and d/c planning.               Action/Plan: Noted referral for CM re pt missing dialysis, however pt made the decision to fly to [place] to sightsee with his wife, this decision making is clearly beyond the scope of case management.   Expected Discharge Date:                  Expected Discharge Plan:  Home/Self Care  In-House Referral:  NA  Discharge planning Services  NA  Post Acute Care Choice:  NA Choice offered to:  NA  DME Arranged:   NA DME Agency:   NA  HH Arranged:   NA HH Agency:   NA  Status of Service:  Completed, signed off  If discussed at Long Length of Stay Meetings, dates discussed:    Additional Comments:  Starlyn Skeans, RN 09/26/2016, 1:57 PM

## 2016-09-27 DIAGNOSIS — Z992 Dependence on renal dialysis: Secondary | ICD-10-CM

## 2016-09-27 DIAGNOSIS — E039 Hypothyroidism, unspecified: Secondary | ICD-10-CM

## 2016-09-27 DIAGNOSIS — N186 End stage renal disease: Secondary | ICD-10-CM

## 2016-09-27 DIAGNOSIS — D631 Anemia in chronic kidney disease: Secondary | ICD-10-CM

## 2016-09-27 DIAGNOSIS — J810 Acute pulmonary edema: Secondary | ICD-10-CM

## 2016-09-27 DIAGNOSIS — R531 Weakness: Secondary | ICD-10-CM

## 2016-09-27 LAB — CBC
HCT: 30.2 % — ABNORMAL LOW (ref 39.0–52.0)
Hemoglobin: 9.9 g/dL — ABNORMAL LOW (ref 13.0–17.0)
MCH: 28.5 pg (ref 26.0–34.0)
MCHC: 32.8 g/dL (ref 30.0–36.0)
MCV: 87 fL (ref 78.0–100.0)
PLATELETS: 244 10*3/uL (ref 150–400)
RBC: 3.47 MIL/uL — AB (ref 4.22–5.81)
RDW: 17.3 % — ABNORMAL HIGH (ref 11.5–15.5)
WBC: 6 10*3/uL (ref 4.0–10.5)

## 2016-09-27 LAB — RENAL FUNCTION PANEL
ALBUMIN: 3.2 g/dL — AB (ref 3.5–5.0)
Anion gap: 19 — ABNORMAL HIGH (ref 5–15)
BUN: 73 mg/dL — ABNORMAL HIGH (ref 6–20)
CALCIUM: 8.3 mg/dL — AB (ref 8.9–10.3)
CO2: 21 mmol/L — ABNORMAL LOW (ref 22–32)
CREATININE: 10.97 mg/dL — AB (ref 0.61–1.24)
Chloride: 95 mmol/L — ABNORMAL LOW (ref 101–111)
GFR, EST AFRICAN AMERICAN: 6 mL/min — AB (ref >60)
GFR, EST NON AFRICAN AMERICAN: 5 mL/min — AB (ref >60)
Glucose, Bld: 130 mg/dL — ABNORMAL HIGH (ref 65–99)
PHOSPHORUS: 11.4 mg/dL — AB (ref 2.5–4.6)
Potassium: 4.4 mmol/L (ref 3.5–5.1)
SODIUM: 135 mmol/L (ref 135–145)

## 2016-09-27 LAB — TSH: TSH: 5.094 u[IU]/mL — ABNORMAL HIGH (ref 0.350–4.500)

## 2016-09-27 LAB — GLUCOSE, CAPILLARY
GLUCOSE-CAPILLARY: 149 mg/dL — AB (ref 65–99)
GLUCOSE-CAPILLARY: 153 mg/dL — AB (ref 65–99)
GLUCOSE-CAPILLARY: 315 mg/dL — AB (ref 65–99)
Glucose-Capillary: 159 mg/dL — ABNORMAL HIGH (ref 65–99)
Glucose-Capillary: 151 mg/dL — ABNORMAL HIGH (ref 65–99)

## 2016-09-27 MED ORDER — ALBUTEROL SULFATE (2.5 MG/3ML) 0.083% IN NEBU: 2.5000 mg | INHALATION_SOLUTION | RESPIRATORY_TRACT | Status: DC | PRN

## 2016-09-27 MED ORDER — ALBUTEROL SULFATE (2.5 MG/3ML) 0.083% IN NEBU
2.5000 mg | INHALATION_SOLUTION | RESPIRATORY_TRACT | Status: DC
  Administered 2016-09-27: 2.5 mg via RESPIRATORY_TRACT
  Filled 2016-09-27: qty 3

## 2016-09-27 MED ORDER — SODIUM CHLORIDE 0.9 % IV SOLN: 100.0000 mL | INTRAVENOUS | Status: DC | PRN

## 2016-09-27 MED ORDER — CLONAZEPAM 1 MG PO TABS
2.0000 mg | ORAL_TABLET | Freq: Every day | ORAL | Status: DC
  Administered 2016-09-27: 2 mg via ORAL
  Filled 2016-09-27: qty 2

## 2016-09-27 MED ORDER — CITALOPRAM HYDROBROMIDE 40 MG PO TABS
40.0000 mg | ORAL_TABLET | Freq: Every day | ORAL | Status: DC
  Administered 2016-09-27: 40 mg via ORAL
  Filled 2016-09-27: qty 1

## 2016-09-27 MED ORDER — ACETAMINOPHEN 325 MG PO TABS
ORAL_TABLET | ORAL | Status: AC
  Filled 2016-09-27: qty 2

## 2016-09-27 MED ORDER — HEPARIN SODIUM (PORCINE) 1000 UNIT/ML DIALYSIS: 20.0000 [IU]/kg | INTRAMUSCULAR | Status: DC | PRN

## 2016-09-27 NOTE — Plan of Care (Signed)
Problem: Education: Goal: Knowledge of [facility] Education information/materials will improve Outcome: Progressing Information re pt safety provided  Problem: Safety: Goal: Ability to remain free from injury will improve Outcome: Progressing Risk factors for falls being assessed. No falls noted  Problem: Health Behavior/Discharge Planning: Goal: Ability to manage health-related needs will improve Effective coping  Skills being assessed  Problem: Pain Managment: Goal: General experience of comfort will improve Outcome: Progressing Monitor pain status. Pt is on percocet q 6 hours as needed and being adm for general pain and he was adm percocet as needed.  Problem: Physical Regulation: Goal: Ability to maintain clinical measurements within normal limits will improve Response for tx being monitored. Vitals with no significant changes.  Goal: Will remain free from infection Outcome: Progressing Infection prevention measures being monitored

## 2016-09-27 NOTE — Progress Notes (Signed)
Pt c/o of sob. His saturation on room air was 94-97%. hospitalist informed and gave another order for a one time dose of albuterol nebs and RT was called to adm the breathing tx. Pain meds adm as well for his gen. Pain.

## 2016-09-27 NOTE — Progress Notes (Signed)
Inpatient Diabetes Program Recommendations  AACE/ADA: New Consensus Statement on Inpatient Glycemic Control (2015)  Target Ranges:  Prepandial:   less than 140 mg/dL      Peak postprandial:   less than 180 mg/dL (1-2 hours)      Critically ill patients:  140 - 180 mg/dL  Results for CONOR, LATA (MRN [id number]) as of 09/27/2016 12:59  Ref. Range 09/26/2016 06:20 09/26/2016 09:19 09/26/2016 12:33 09/26/2016 17:20 09/26/2016 20:45 09/27/2016 05:02 09/27/2016 12:07  Glucose-Capillary Latest Ref Range: 65 - 99 mg/dL 366 (H) 440 (H) 347 (H) 243 (H) 267 (H) 153 (H) 159 (H)    Review of Glycemic Control  Current orders for Inpatient glycemic control: Lantus 30 units daily, Novolog 0-15 units Q4H  Inpatient Diabetes Program Recommendations: Insulin - Meal Coverage: Please consider ordering Novolog 3 units TID with meals for meal coverage if patient eats at least 50% of meals.  Thanks, Orlando Penner, RN, MSN, CDE Diabetes Coordinator Inpatient Diabetes Program [phone] (Team Pager from 8am to 5pm)

## 2016-09-27 NOTE — Progress Notes (Signed)
PROGRESS NOTE  Russell [name] NWG:[id number] DOB: 1970-12-30 DOA: 09/25/2016 PCP: [name], MD   LOS: 1 day   Brief Narrative / Interim history: 46 year old male with medical history significant for ESRD on hemodialysis, insulin dependent DM, CAD s/p CABG X3 in 2017, hypertension and hypothyroidism.  The patient reports traveling to [place] Thursday of last week to see his wife and missed his Friday dialysis.  By Friday he began feeling more tired and this progressively worsened. On Sunday, he was unable to get out of a chair and required a wheelchair in the airport. When he landed in [place] his wife immediately called EMS for transport to hospital.   In the ED he was unable to provide a history due to weakness and shortness of breath; his vitals were BP 170/90, HR 89, RR 19, SpO2 100% on 2 L nasal cannula, and Wt. 113.5 kg.  His labs revealed NA 133, K >7.5, BUN 123, Cr 15, WBC 10.8 and Hgb 10.5.  Further work up included CXR significant for edema and ECG with QRS widening.  He was given bicarb, insulin/D50 and calcium gluconate.  Nephrology recommended HD as soon as possible.   Assessment & Plan: Principal Problem:   Hyperkalemia Active Problems:   Hypothyroidism (acquired)   Chest pain   Uncontrolled diabetes mellitus type 2 with peripheral artery disease (HCC)   Morbid obesity (HCC)   Essential hypertension, benign   ESRD on dialysis [facility])   Coronary artery disease involving native coronary artery of native heart without angina pectoris   Anemia due to end stage renal disease (HCC)   Hyperkalemia and Pulmonary Edema - In the setting of missed hemodialysis sessions; patient has improved overall clinically - Potassium has improved to 4.1 with HD - Still feeling somewhat weak and having some cramping in extremities - PT consult to work on strength and mobility to prepare for discharge - Add inhaler for shortness of breath - Plan to discharge tomorrow if patient continues to  improve - Case management followed patient and believes he can return home with no home health  Hypertension - Likely in the setting of volume overload with missed HD  - BP has been well-controlled with the exception of a few hypotensive readings during HD - has normalized - Continue Norvasc and metoprolol - Resume normal hemodialysis schedule on discharge  Diabetes Mellitus - Well controlled - Last 2 CBG levels were 153 and 159 - Continue managing with Lantus and Novolog injections  Prolonged QTC - Was likely in setting of hyperkalemia - has improved since time of admission after K was corrected with HD - Resume normal HD schedule once discharged to prevent recurrence  Hypothyroidism - Continue Levothyroxine    DVT prophylaxis: low dose lovenox Code Status: full Family Communication: no family at bedside Disposition Plan: home, possibly tomorrow  Consultants:   nephrology  Procedures:   HD: 09/27/16   Subjective: Patient reports that he is feeling tired, weak, and like he can't catch his breath at times.  He also reports some cramp-like pain in his extremities.  Overall he feels improved from yesterday but would like to work on building some strength before discharging.    Objective: Vitals:   09/27/16 0930 09/27/16 1000 09/27/16 1030 09/27/16 1100  BP: (!) 103/35 109/89 116/75 128/69  Pulse: 70 75 64 81  Resp:      Temp:      TempSrc:      SpO2:      Weight:  Height:        Intake/Output Summary (Last 24 hours) at 09/27/16 1118 Last data filed at 09/27/16 1308  Gross per 24 hour  Intake              240 ml  Output                0 ml  Net              240 ml   Filed Weights   09/26/16 0000 09/26/16 2046 09/27/16 0710  Weight: 113.4 kg (250 lb) 122.4 kg (269 lb 12.8 oz) 122 kg (268 lb 15.4 oz)    Examination:  Constitutional: obese male, lying in bed supine on dialysis during exam; no acute distress Eyes: no scleral icterus, no discharge ENMT:  Mucous membranes are moist Respiratory: no accessory muscle use; no cyanosis; no rales Cardiovascular: regular rate and rhythm; no murmur; no pitting edema in lower extremities but some swelling is present.  Psychiatric: cooperative with normal mood; alert and oriented X3   Data Reviewed: I have independently reviewed following labs and imaging studies   CBC:  Recent Labs Lab 09/26/16 0001 09/26/16 1053 09/27/16 0752  WBC 10.8* 7.7 6.0  NEUTROABS 9.5*  --   --   HGB 10.5* 10.6* 9.9*  HCT 32.9* 32.6* 30.2*  MCV 88.9 87.4 87.0  PLT 293 237 244   Basic Metabolic Panel:  Recent Labs Lab 09/26/16 0001 09/26/16 0448 09/26/16 1053 09/27/16 0752  NA 133*  --  136 135  K >7.5* 5.7* 4.1 4.4  CL 95*  --  93* 95*  CO2 18*  --  24 21*  GLUCOSE 255*  --  174* 130*  BUN 123*  --  57* 73*  CREATININE 15.23*  --  8.99* 10.97*  CALCIUM 8.4*  --  8.7* 8.3*  PHOS  --   --   --  11.4*   GFR: Estimated Creatinine Clearance: 11.8 mL/min (A) (by C-G formula based on SCr of 10.97 mg/dL (H)). Liver Function Tests:  Recent Labs Lab 09/27/16 0752  ALBUMIN 3.2*   No results for input(s): LIPASE, AMYLASE in the last 168 hours. No results for input(s): AMMONIA in the last 168 hours. Coagulation Profile: No results for input(s): INR, PROTIME in the last 168 hours. Cardiac Enzymes: No results for input(s): CKTOTAL, CKMB, CKMBINDEX, TROPONINI in the last 168 hours. BNP (last 3 results) No results for input(s): PROBNP in the last 8760 hours. HbA1C: No results for input(s): HGBA1C in the last 72 hours. CBG:  Recent Labs Lab 09/26/16 1233 09/26/16 1720 09/26/16 2045 09/27/16 0502 09/27/16 1207  GLUCAP 184* 243* 267* 153* 159*   Lipid Profile: No results for input(s): CHOL, HDL, LDLCALC, TRIG, CHOLHDL, LDLDIRECT in the last 72 hours. Thyroid Function Tests: No results for input(s): TSH, T4TOTAL, FREET4, T3FREE, THYROIDAB in the last 72 hours. Anemia Panel: No results for  input(s): VITAMINB12, FOLATE, FERRITIN, TIBC, IRON, RETICCTPCT in the last 72 hours. Urine analysis: Sepsis Labs: Invalid input(s): PROCALCITONIN, LACTICIDVEN  Recent Results (from the past 240 hour(s))  MRSA PCR Screening     Status: None   Collection Time: 09/26/16 11:37 AM  Result Value Ref Range Status   MRSA by PCR NEGATIVE NEGATIVE Final    Comment:        The GeneXpert MRSA Assay (FDA approved for NASAL specimens only), is one component of a comprehensive MRSA colonization surveillance program. It is not intended to diagnose MRSA infection nor to  guide or monitor treatment for MRSA infections.       Radiology Studies: Dg Chest Port 1 View  Result Date: 09/26/2016 CLINICAL DATA:  Dyspnea this evening EXAM: PORTABLE CHEST 1 VIEW COMPARISON:  08/21/2016 FINDINGS: Cardiomegaly normal abdominal increased interstitial more edema and central vascular congestion. Low lung volumes with bibasilar atelectasis. Median sternotomy sutures are in place. There is aortic atherosclerosis at the arch. No acute nor suspicious osseous abnormalities. IMPRESSION: Stable cardiomegaly with aortic atherosclerosis. Interval development of interstitial edema and mild CHF Electronically Signed   By: Tollie Eth M.D.   On: 09/26/2016 00:40     Scheduled Meds: . acetaminophen      . amLODipine  10 mg Oral Once per day on Sun Tue Thu Sat  . aspirin EC  81 mg Oral Daily  . atorvastatin  80 mg Oral q1800  . calcitRIOL  0.25 mcg Oral Q M,W,F  . calcium acetate  2,001 mg Oral TID WC  . cinacalcet  30 mg Oral Q M,W,F  . clopidogrel  75 mg Oral Daily  . enoxaparin (LOVENOX) injection  30 mg Subcutaneous Q24H  . insulin aspart  0-15 Units Subcutaneous Q4H  . insulin glargine  30 Units Subcutaneous Daily  . levothyroxine  50 mcg Oral QAC breakfast  . metoprolol tartrate  100 mg Oral BID   Continuous Infusions: . sodium chloride    . sodium chloride    . sodium chloride    . sodium chloride        Shelbie Proctor, PA-S [facility] 09/27/2016, 11:18 AM

## 2016-09-27 NOTE — Procedures (Signed)
I was present at this dialysis session. I have reviewed the session itself and made appropriate changes.   Filed Weights   09/26/16 0000 09/26/16 2046 09/27/16 0710  Weight: 113.4 kg (250 lb) 122.4 kg (269 lb 12.8 oz) (P) 122 kg (268 lb 15.4 oz)     Recent Labs Lab 09/26/16 1053  NA 136  K 4.1  CL 93*  CO2 24  GLUCOSE 174*  BUN 57*  CREATININE 8.99*  CALCIUM 8.7*     Recent Labs Lab 09/26/16 0001 09/26/16 1053 09/27/16 0752  WBC 10.8* 7.7 6.0  NEUTROABS 9.5*  --   --   HGB 10.5* 10.6* 9.9*  HCT 32.9* 32.6* 30.2*  MCV 88.9 87.4 87.0  PLT 293 237 244    Scheduled Meds: . acetaminophen      . amLODipine  10 mg Oral Once per day on Sun Tue Thu Sat  . aspirin EC  81 mg Oral Daily  . atorvastatin  80 mg Oral q1800  . calcitRIOL  0.25 mcg Oral Q M,W,F  . calcium acetate  2,001 mg Oral TID WC  . cinacalcet  30 mg Oral Q M,W,F  . clopidogrel  75 mg Oral Daily  . enoxaparin (LOVENOX) injection  30 mg Subcutaneous Q24H  . insulin aspart  0-15 Units Subcutaneous Q4H  . insulin glargine  30 Units Subcutaneous Daily  . levothyroxine  50 mcg Oral QAC breakfast  . metoprolol tartrate  100 mg Oral BID   Continuous Infusions: . sodium chloride    . sodium chloride    . sodium chloride    . sodium chloride     PRN Meds:.sodium chloride, sodium chloride, sodium chloride, sodium chloride, acetaminophen, albuterol, alteplase, calcium acetate, heparin, heparin, lidocaine (PF), lidocaine-prilocaine, oxyCODONE-acetaminophen, pentafluoroprop-tetrafluoroeth    Assessment/Plan 1. Uremia and volume overload due to nonadherence with HD.  Hopefully will see improvement of his electrolytes and volume today after HD. 2. 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 HTN- low BP today, will use sodium modeling and cool dialysate. 5. Disposition- per primary svc  Irena Cords,  MD 09/27/2016, 8:28 AM

## 2016-09-28 LAB — GLUCOSE, CAPILLARY
Glucose-Capillary: 118 mg/dL — ABNORMAL HIGH (ref 65–99)
Glucose-Capillary: 136 mg/dL — ABNORMAL HIGH (ref 65–99)
Glucose-Capillary: 172 mg/dL — ABNORMAL HIGH (ref 65–99)

## 2016-09-28 MED ORDER — DARBEPOETIN ALFA 60 MCG/0.3ML IJ SOSY: 60.0000 ug | PREFILLED_SYRINGE | INTRAMUSCULAR | Status: DC

## 2016-09-28 MED ORDER — GABAPENTIN 100 MG PO CAPS: 200.0000 mg | ORAL_CAPSULE | Freq: Every day | ORAL | 0 refills | Status: DC

## 2016-09-28 MED ORDER — SODIUM CHLORIDE 0.9 % IV SOLN: 62.5000 mg | INTRAVENOUS | Status: DC

## 2016-09-28 MED ORDER — LEVOTHYROXINE SODIUM 75 MCG PO TABS: 75.0000 ug | ORAL_TABLET | Freq: Every day | ORAL | Status: DC

## 2016-09-28 MED ORDER — LEVOTHYROXINE SODIUM 75 MCG PO TABS: 75.0000 ug | ORAL_TABLET | Freq: Every day | ORAL | 0 refills | Status: DC

## 2016-09-28 MED ORDER — CALCITRIOL 0.5 MCG PO CAPS: 0.5000 ug | ORAL_CAPSULE | ORAL | Status: DC

## 2016-09-28 NOTE — Evaluation (Signed)
Physical Therapy Evaluation Patient Details Name: Russell [name] MRN: [id number] DOB: March 27, 1970 Today's Date: 09/28/2016   History of Present Illness  Pt is a 46 y/o male admitted secondary to weakness and SOB. Pt on a trip to [place] and missed HD treatments. Found to have hyperkalemia as well. PMH includes DM, PAD, CAD, depresssion, ESRD on dialysis MWF, and HTN.   Clinical Impression  Pt admitted secondary to problem above with deficits below. PTA, pt was independent with functional mobility. Upon eval, pt presenting with L knee pain from fall and decreased balance, and weakness in B hands. Required min guard assist during mobility and demonstrated decreased stability with dynamic gait tasks. Educated about using walking stick at home and pt agreeable. Discussed outpatient PT, however, pt does not feel like he needs at this time. Discussed decreased grip strength and numbness with OT and OT to further assess. Will continue to follow acutely to maximize functional mobility independence and safety.     Follow Up Recommendations No PT follow up;Supervision for mobility/OOB    Equipment Recommendations  None recommended by PT (has needed DME at home )    Recommendations for Other Services       Precautions / Restrictions Precautions Precautions: Fall Precaution Comments: History of fall Restrictions Weight Bearing Restrictions: No      Mobility  Bed Mobility               General bed mobility comments: Sitting EOB upon entry  Transfers Overall transfer level: Needs assistance Equipment used: None Transfers: Sit to/from Stand Sit to Stand: Min guard         General transfer comment: Min guard for safety   Ambulation/Gait Ambulation/Gait assistance: Min guard Ambulation Distance (Feet): 150 Feet Assistive device: None Gait Pattern/deviations: Step-through pattern;Decreased stride length;Drifts right/left Gait velocity: Decreased Gait velocity interpretation:  Below normal speed for age/gender General Gait Details: Slow, guarded gait. Unsteadiness noted with horizontal and vertical head turns during gait. Educated about using walking stick at home to increase stability.   Stairs            Wheelchair Mobility    Modified Rankin (Stroke Patients Only)       Balance Overall balance assessment: Needs assistance Sitting-balance support: No upper extremity supported;Feet supported Sitting balance-Leahy Scale: Good     Standing balance support: No upper extremity supported;During functional activity Standing balance-Leahy Scale: Fair Standing balance comment: Static standing                  Standardized Balance Assessment Standardized Balance Assessment : Dynamic Gait Index   Dynamic Gait Index Level Surface: Mild Impairment Gait with Horizontal Head Turns: Mild Impairment Gait with Vertical Head Turns: Mild Impairment       Pertinent Vitals/Pain Pain Assessment: 0-10 Pain Score: 6  Pain Location: L knee  Pain Descriptors / Indicators: Aching;Throbbing Pain Intervention(s): Limited activity within patient's tolerance;Monitored during session;Repositioned    Home Living Family/patient expects to be discharged to:: Private residence Living Arrangements: Parent Available Help at Discharge: Family;Available 24 hours/day Type of Home: House Home Access: Stairs to enter Entrance Stairs-Rails: None Entrance Stairs-Number of Steps: 1 (threshold ) Home Layout: One level Home Equipment: Other (comment) (walking stick )      Prior Function Level of Independence: Independent               Hand Dominance   Dominant Hand: Right    Extremity/Trunk Assessment   Upper Extremity Assessment Upper Extremity Assessment: RUE  deficits/detail;LUE deficits/detail RUE Deficits / Details: Reports numbness and exhibiting decreased grip strength that started about 2 weeks ago RUE Sensation: decreased light touch RUE  Coordination: decreased fine motor LUE Deficits / Details: Reports numbness and exhibiting decreased grip strength that started about 2 weeks ago LUE Sensation: decreased light touch LUE Coordination: decreased fine motor    Lower Extremity Assessment Lower Extremity Assessment: LLE deficits/detail LLE Deficits / Details: L knee pain resulting from previous fall.     Cervical / Trunk Assessment Cervical / Trunk Assessment: Kyphotic  Communication   Communication: No difficulties  Cognition Arousal/Alertness: Awake/alert Behavior During Therapy: WFL for tasks assessed/performed Overall Cognitive Status: Within Functional Limits for tasks assessed                                        General Comments General comments (skin integrity, edema, etc.): Spoke with OT regarding decreased grip strength and numbness in hands, and she will assess further. Discussed outpatient PT with pt, however, pt reports he does not think he will need it. Discussed importance of moving L knee to prevent stiffness and decrease pain     Exercises     Assessment/Plan    PT Assessment Patient needs continued PT services  PT Problem List Decreased activity tolerance;Decreased balance;Decreased mobility;Decreased knowledge of use of DME;Pain       PT Treatment Interventions DME instruction;Gait training;Stair training;Therapeutic activities;Therapeutic exercise;Functional mobility training;Balance training;Neuromuscular re-education;Patient/family education    PT Goals (Current goals can be found in the Care Plan section)  Acute Rehab PT Goals Patient Stated Goal: to go home  PT Goal Formulation: With patient Time For Goal Achievement: 10/05/16 Potential to Achieve Goals: Good    Frequency Min 3X/week   Barriers to discharge        Co-evaluation               AM-PAC PT "6 Clicks" Daily Activity  Outcome Measure Difficulty turning over in bed (including adjusting  bedclothes, sheets and blankets)?: None Difficulty moving from lying on back to sitting on the side of the bed? : None Difficulty sitting down on and standing up from a chair with arms (e.g., wheelchair, bedside commode, etc,.)?: Unable Help needed moving to and from a bed to chair (including a wheelchair)?: A Little Help needed walking in hospital room?: A Little Help needed climbing 3-5 steps with a railing? : A Little 6 Click Score: 18    End of Session Equipment Utilized During Treatment: Gait belt Activity Tolerance: Patient tolerated treatment well Patient left: in bed;with call bell/phone within reach (sitting EOB ) Nurse Communication: Mobility status PT Visit Diagnosis: Unsteadiness on feet (R26.81);Pain Pain - Right/Left: Left Pain - part of body: Knee    Time: 1045-1100 PT Time Calculation (min) (ACUTE ONLY): 15 min   Charges:   PT Evaluation $PT Eval Low Complexity: 1 Low     PT G Codes:        Gladys Damme, PT, DPT  Acute Rehabilitation Services  Pager: [phone]   Lehman Prom 09/28/2016, 11:15 AM

## 2016-09-28 NOTE — Evaluation (Signed)
Occupational Therapy Evaluation Patient Details Name: Russell [name] MRN: [id number] DOB: 1970/02/04 Today's Date: 09/28/2016    History of Present Illness Pt is a 46 y/o male admitted secondary to weakness and SOB. Pt on a trip to [place] and missed HD treatments. Found to have hyperkalemia as well. PMH includes DM, PAD, CAD, depresssion, ESRD on dialysis MWF, and HTN.    Clinical Impression   PTA Pt with a history of falls but independent in ADL and mobility, with worsening strength and sensation in BUE. Pt currently presents with deficits in sensation, strength and ROM in BUE impacting his ability to perform ADL. Pt educated in compensatory strategies and ways to adopt items like grooming items to compensate for his deficits. Pt will require OPOT to rehab BUE and maximize safety and independence in ADL. AT this time (due to history of falls) OT recommending shower chair for in tub (Pt declining). Pt due to discharge today, so no further acute OT needs at this time.    Follow Up Recommendations  Outpatient OT;Supervision - Intermittent    Equipment Recommendations  Tub/shower seat (Pt declined, OT recommends with history of falls)    Recommendations for Other Services       Precautions / Restrictions Precautions Precautions: Fall Precaution Comments: History of fall Restrictions Weight Bearing Restrictions: No      Mobility Bed Mobility Overal bed mobility: Needs Assistance Bed Mobility: Supine to Sit;Sit to Supine     Supine to sit: Supervision Sit to supine: Supervision   General bed mobility comments: extra effort, use of bed rails, no physical assist needed  Transfers Overall transfer level: Needs assistance Equipment used: None Transfers: Sit to/from Stand Sit to Stand: Min guard         General transfer comment: Min guard for safety     Balance Overall balance assessment: Needs assistance Sitting-balance support: No upper extremity supported;Feet  supported Sitting balance-Leahy Scale: Good     Standing balance support: No upper extremity supported;During functional activity Standing balance-Leahy Scale: Fair Standing balance comment: Static standing                  Standardized Balance Assessment Standardized Balance Assessment : Dynamic Gait Index   Dynamic Gait Index Level Surface: Mild Impairment Gait with Horizontal Head Turns: Mild Impairment Gait with Vertical Head Turns: Mild Impairment     ADL either performed or assessed with clinical judgement   ADL Overall ADL's : Needs assistance/impaired Eating/Feeding: Minimal assistance Eating/Feeding Details (indicate cue type and reason): Pt reports difficulty in holding utensils while eating - built up handles provided Grooming: Set up;With adaptive equipment (built up handles to assist) Grooming Details (indicate cue type and reason): educated on how to use wash cloth to build up handles, also provided with red tubing Upper Body Bathing: Supervision/ safety   Lower Body Bathing: Supervison/ safety   Upper Body Dressing : Modified independent   Lower Body Dressing: Sit to/from stand;Min guard Lower Body Dressing Details (indicate cue type and reason): able to don/doff socks with extra effort and time required due to clumsiness in hands and pain in knees Toilet Transfer: Min guard   Toileting- Clothing Manipulation and Hygiene: Min guard   Tub/ Shower Transfer: Min guard   Functional mobility during ADLs: Min guard       Vision Patient Visual Report: No change from baseline       Perception     Praxis      Pertinent Vitals/Pain Pain Assessment: 0-10  Pain Score: 6  Pain Location: L knee  Pain Descriptors / Indicators: Aching;Throbbing Pain Intervention(s): Monitored during session;Repositioned     Hand Dominance Right   Extremity/Trunk Assessment Upper Extremity Assessment Upper Extremity Assessment: RUE deficits/detail;LUE  deficits/detail RUE Deficits / Details: Reports numbness and grossly 3/5 grasp, able to touch back of head, unable to touch lower back, able to maintain 90 degrees forward flexion from shoulder fro at least 10 seconds without drift. limited finger dexterity (unable to bring tumb across to reach pinky) grossly 3+/5 overall. Pt reports this has been for 2 weeks RUE Sensation: decreased light touch RUE Coordination: decreased fine motor;decreased gross motor LUE Deficits / Details: Reports numbness and grossly 3/5 grasp, able to touch back of head, unable to touch lower back, able to maintain 90 degrees forward flexion from shoulder fro at least 10 seconds without drift. limited finger dexterity (unable to bring tumb across to reach pinky) grossly 3+/5 overall. Pt reports this has been for 2 weeks LUE Sensation: decreased light touch LUE Coordination: decreased fine motor;decreased gross motor   Lower Extremity Assessment Lower Extremity Assessment: Defer to PT evaluation LLE Deficits / Details: L knee pain resulting from previous fall.    Cervical / Trunk Assessment Cervical / Trunk Assessment: Kyphotic   Communication Communication Communication: No difficulties   Cognition Arousal/Alertness: Awake/alert Behavior During Therapy: WFL for tasks assessed/performed Overall Cognitive Status: Within Functional Limits for tasks assessed                                     General Comments  Pt encouraged to keep using hands and BUE as functionally as possible    Exercises     Shoulder Instructions      Home Living Family/patient expects to be discharged to:: Private residence Living Arrangements: Parent Available Help at Discharge: Family;Available 24 hours/day Type of Home: House Home Access: Stairs to enter [employer] of Steps: 1 (threshold ) Entrance Stairs-Rails: None Home Layout: One level     Bathroom Shower/Tub: Chief Strategy Officer:  Standard     Home Equipment: Other (comment) (walking stick )          Prior Functioning/Environment Level of Independence: Independent                 OT Problem List: Decreased strength;Decreased range of motion;Impaired balance (sitting and/or standing);Decreased coordination;Decreased safety awareness;Impaired sensation;Impaired UE functional use      OT Treatment/Interventions:      OT Goals(Current goals can be found in the care plan section) Acute Rehab OT Goals Patient Stated Goal: to go home  OT Goal Formulation: With patient Time For Goal Achievement: 10/12/16 Potential to Achieve Goals: Good  OT Frequency:     Barriers to D/C:            Co-evaluation              AM-PAC PT "6 Clicks" Daily Activity     Outcome Measure Help from another person eating meals?: A Little Help from another person taking care of personal grooming?: A Little Help from another person toileting, which includes using toliet, bedpan, or urinal?: A Little Help from another person bathing (including washing, rinsing, drying)?: A Little Help from another person to put on and taking off regular upper body clothing?: A Little Help from another person to put on and taking off regular lower body clothing?: A  Little 6 Click Score: 18   End of Session Nurse Communication: Mobility status  Activity Tolerance: Patient tolerated treatment well Patient left: in bed;with call bell/phone within reach;with nursing/sitter in room  OT Visit Diagnosis: Unsteadiness on feet (R26.81);Muscle weakness (generalized) (M62.81)                Time: 1610-9604 OT Time Calculation (min): 23 min Charges:  OT General Charges $OT Visit: 1 Visit OT Evaluation $OT Eval Moderate Complexity: 1 Mod OT Treatments $Self Care/Home Management : 8-22 mins G-Codes:     Sherryl Manges OTR/L [phone]  Russell [name] 09/28/2016, 12:47 PM

## 2016-09-28 NOTE — Progress Notes (Signed)
Pt discharged home in stable condition after going over discharge instructions with no concerns voiced. AVS and discharge script given to pt before leaving the unit

## 2016-09-28 NOTE — Care Management Note (Addendum)
Case Management Note  Patient Details  Name: Russell [name] MRN: [id number] Date of Birth: 1970/02/17  Subjective/Objective:          CM following for progression and d/c planning.           Action/Plan: 09/28/2016 Noted orders for HHPT and HHOT. OT eval recommending outpatient OT, however per discussion with pt he if not sure that he has transportation to outpatient OT. Pt states that he is changing his address effective today. He will be living with his parents in [place]. This CM will arrange [facility] as orginally ordered. Per pt he will also need to change his HD site however has transport to his current HD center until he can get this changed. Will follow for any further needs.   Expected Discharge Date:  09/28/16               Expected Discharge Plan:  Home with home health  In-House Referral:  NA  Discharge planning Services  NA  Post Acute Care Choice:  NA Choice offered to:  NA  DME Arranged:   NA DME Agency:   NA  HH Arranged:   HHPT and HHOT.  HH Agency:   [facility].  Status of Service:  Completed, signed off  If discussed at Long Length of Stay Meetings, dates discussed:    Additional Comments:  Starlyn Skeans, RN 09/28/2016, 11:27 AM

## 2016-09-28 NOTE — Discharge Summary (Signed)
Physician Discharge Summary  Russell [name]:[id number] DOB: Nov 15, 1970 DOA: 09/25/2016  PCP: [name], MD  Admit date: 09/25/2016 Discharge date: 09/28/2016  Admitted From:home Disposition:home  Recommendations for Outpatient Follow-up:  1. Follow up with PCP in 1-2 weeks 2. Please obtain BMP/CBC in one week  Home Health:yes, PT/OT eval Equipment/Devices:none Discharge Condition:stable CODE STATUS:full code Diet recommendation:carb modified heart healthy diet  Brief/Interim Summary: 46 year old gentleman with history of insulin-dependent diabetes, coronary artery disease status post CABG, hypertension, hypothyroidism, ESRD on hemodialysis presented with severe hyperkalemia with serum potassium of more than 7.5, with and with shortness of breath in the setting of missing hemodialysis treatment while traveling to [place]. Patient required urgent dialysis on admission for the management of hyperkalemia. Patient received dialysis treatment twice in the hospital with significant clinical improvement. Education provided to the patient regarding compliance with the dialysis treatment and low salt diet. He verbalized understanding. He feels better. Denied headache, dizziness, chest pressure shortness of breath. He has generalized weakness. TSH found to be elevated therefore increased the dose of Synthroid to 75 g. Patient reported compliance with thyroid medication. I recommended patient to follow-up with PCP to monitor thyroid function tests in 4-6 weeks. He understand about dialysis treatment scheduled for tomorrow outpatient. Continued home medication for the management of diabetes and hypertension. At this time patient is medically stable to transfer his care to outpatient. Ordered home PT OT at discharge. Discussed with the case manager and nursing staff.  Problem list Insulin-dependent diabetes: Blood sugar level acceptable. Continue home dose of insulin regimen. Recommend follow-up  with PCP. ESRD on hemodialysis: Dialysis treatment as per nephrology. Anemia of chronic kidney disease: Hemoglobin is stable. No sign of bleeding. Monitor CBC. Coronary artery disease status post CABG Hypertension: Low-salt diet, continue current medication. Increase ultrafiltration at dialysis. Hypothyroidism: check TSH level because of generalized weakness. Severe hyperkalemia on admission: Improved now Hyperlipidemia  Discharge Diagnoses:  Principal Problem:   Hyperkalemia Active Problems:   Hypothyroidism (acquired)   Chest pain   Uncontrolled diabetes mellitus type 2 with peripheral artery disease (HCC)   Morbid obesity (HCC)   Essential hypertension, benign   ESRD on dialysis (HCC)   Coronary artery disease involving native coronary artery of native heart without angina pectoris   Anemia due to end stage renal disease (HCC)   Acute pulmonary edema (HCC)   Anemia in chronic kidney disease, on chronic dialysis (HCC)   Weakness    Discharge Instructions  Discharge Instructions    Call MD for:  difficulty breathing, headache or visual disturbances    Complete by:  As directed    Call MD for:  extreme fatigue    Complete by:  As directed    Call MD for:  hives    Complete by:  As directed    Call MD for:  persistant dizziness or light-headedness    Complete by:  As directed    Call MD for:  persistant nausea and vomiting    Complete by:  As directed    Call MD for:  severe uncontrolled pain    Complete by:  As directed    Call MD for:  temperature >100.4    Complete by:  As directed    Diet - low sodium heart healthy    Complete by:  As directed    Diet Carb Modified    Complete by:  As directed    Discharge instructions    Complete by:  As directed  Please continue your outpatient hemodialysis treatment schedule.   Increase activity slowly    Complete by:  As directed      Allergies as of 09/28/2016      Reactions   Ibuprofen Other (See Comments)   MD told  patient not to take due to kidney disease   Nsaids Other (See Comments)   Told to avoid all nsaids due to kidney disease    Tape Other (See Comments)   Welts result, if left for a long amount of time      Medication List    TAKE these medications   acetaminophen 500 MG tablet Commonly known as:  TYLENOL Take 1 tablet (500 mg total) by mouth every 6 (six) hours as needed for moderate pain. What changed:  how much to take  reasons to take this   albuterol 108 (90 Base) MCG/ACT inhaler Commonly known as:  PROVENTIL HFA;VENTOLIN HFA Inhale 1 puff into the lungs every 6 (six) hours as needed for wheezing or shortness of breath. What changed:  how much to take  when to take this   albuterol (2.5 MG/3ML) 0.083% nebulizer solution Commonly known as:  PROVENTIL Take 3 mLs (2.5 mg total) by nebulization every 4 (four) hours as needed for wheezing. What changed:  Another medication with the same name was changed. Make sure you understand how and when to take each.   albuterol-ipratropium 18-103 MCG/ACT inhaler Commonly known as:  COMBIVENT Inhale 2 puffs into the lungs every 4 (four) hours. What changed:  when to take this  reasons to take this   amLODipine 10 MG tablet Commonly known as:  NORVASC Take 1 tablet (10 mg total) by mouth daily. DO NOT TAKE ON DIALYSIS DAYS   aspirin 81 MG EC tablet Take 1 tablet (81 mg total) by mouth daily.   atorvastatin 80 MG tablet Commonly known as:  LIPITOR Take 1 tablet (80 mg total) by mouth daily at 6 PM.   calcitRIOL 0.25 MCG capsule Commonly known as:  ROCALTROL Take 1 capsule (0.25 mcg total) by mouth every Monday, Wednesday, and Friday with hemodialysis. What changed:  when to take this  additional instructions   calcium acetate 667 MG capsule Commonly known as:  PHOSLO Take 2 capsules (1,334 mg total) by mouth 3 (three) times daily with meals. What changed:  how much to take  when to take this  additional  instructions   cinacalcet 30 MG tablet Commonly known as:  SENSIPAR Take 30 mg by mouth every Monday, Wednesday, and Friday.   citalopram 40 MG tablet Commonly known as:  CELEXA Take 40 mg by mouth at bedtime.   clonazePAM 2 MG tablet Commonly known as:  KLONOPIN Take 2 mg by mouth daily.   clopidogrel 75 MG tablet Commonly known as:  PLAVIX Take 1 tablet (75 mg total) by mouth daily.   cyclobenzaprine 10 MG tablet Commonly known as:  FLEXERIL Take 1 tablet (10 mg total) by mouth at bedtime.   gabapentin 100 MG capsule Commonly known as:  NEURONTIN Take 2 capsules (200 mg total) by mouth at bedtime. What changed:  when to take this   HYDROcodone-acetaminophen 5-325 MG tablet Commonly known as:  NORCO/VICODIN Take 1-2 tablets by mouth every 4 (four) hours as needed for moderate pain.   HYDROmorphone 2 MG tablet Commonly known as:  DILAUDID Take 2 mg by mouth 3 (three) times daily.   hydrOXYzine 25 MG tablet Commonly known as:  ATARAX/VISTARIL Take 25 mg by mouth  2 (two) times daily as needed for anxiety.   insulin glargine 100 UNIT/ML injection Commonly known as:  LANTUS Inject 0.4 mLs (40 Units total) into the skin 2 (two) times daily. What changed:  how much to take  when to take this  Another medication with the same name was removed. Continue taking this medication, and follow the directions you see here.   insulin lispro 100 UNIT/ML injection Commonly known as:  HUMALOG Inject 0.15-0.3 mLs (15-30 Units total) into the skin every evening. Take one unit per 5 carbs depending on dinner.   levothyroxine 75 MCG tablet Commonly known as:  SYNTHROID, LEVOTHROID Take 1 tablet (75 mcg total) by mouth daily before breakfast. What changed:  medication strength  how much to take   metoCLOPramide 5 MG tablet Commonly known as:  REGLAN Take 1 tablet (5 mg total) by mouth every 8 (eight) hours as needed for nausea or vomiting. What changed:  how much to  take  when to take this   metoprolol tartrate 50 MG tablet Commonly known as:  LOPRESSOR Take 2 tablets (100 mg total) by mouth 2 (two) times daily.   pantoprazole 40 MG tablet Commonly known as:  PROTONIX Take 1 tablet (40 mg total) by mouth daily.            Discharge Care Instructions        Start     Ordered   09/29/16 0000  levothyroxine (SYNTHROID, LEVOTHROID) 75 MCG tablet  Daily before breakfast     09/28/16 1032   09/28/16 0000  gabapentin (NEURONTIN) 100 MG capsule  Daily at bedtime     09/28/16 1032   09/28/16 0000  Increase activity slowly     09/28/16 1032   09/28/16 0000  Diet - low sodium heart healthy     09/28/16 1032   09/28/16 0000  Discharge instructions    Comments:  Please continue your outpatient hemodialysis treatment schedule.   09/28/16 1032   09/28/16 0000  Diet Carb Modified     09/28/16 1032   09/28/16 0000  Call MD for:  temperature >100.4     09/28/16 1032   09/28/16 0000  Call MD for:  persistant nausea and vomiting     09/28/16 1032   09/28/16 0000  Call MD for:  severe uncontrolled pain     09/28/16 1032   09/28/16 0000  Call MD for:  difficulty breathing, headache or visual disturbances     09/28/16 1032   09/28/16 0000  Call MD for:  hives     09/28/16 1032   09/28/16 0000  Call MD for:  persistant dizziness or light-headedness     09/28/16 1032   09/28/16 0000  Call MD for:  extreme fatigue     09/28/16 1032     Follow-up Information    [name], MD. Schedule an appointment as soon as possible for a visit in 1 week(s).   Specialty:  Family Medicine Contact information: [address] [place] [address] [phone]          Allergies  Allergen Reactions  . Ibuprofen Other (See Comments)    MD told patient not to take due to kidney disease  . Nsaids Other (See Comments)    Told to avoid all nsaids due to kidney disease   . Tape Other (See Comments)    Welts result, if left for a long amount of time     Consultations: Nephrology  Procedures/Studies: None  Subjective: Seen and examined at bedside. Denied headache, dizziness, nausea vomiting chest pain shortness of breath. Feeling better. Hasn't was weakness.  Discharge Exam: Vitals:   09/27/16 2034 09/28/16 0434  BP: 126/84 (!) 166/100  Pulse: 77 80  Resp: 16 16  Temp: 98.4 F (36.9 C) 98 F (36.7 C)  SpO2: 96% 95%   Vitals:   09/27/16 1157 09/27/16 1900 09/27/16 2034 09/28/16 0434  BP: (!) 141/83 131/62 126/84 (!) 166/100  Pulse: 79 77 77 80  Resp: Temp: 97.8 F (36.6 C) 98.3 F (36.8 C) 98.4 F (36.9 C) 98 F (36.7 C)  TempSrc: Oral Oral Oral Oral  SpO2: 97% 97% 96% 95%  Weight:   116.6 kg (257 lb)   Height:        General: Pt is alert, awake, not in acute distress Cardiovascular: RRR, S1/S2 +, no rubs, no gallops Respiratory: CTA bilaterally, no wheezing, no rhonchi Abdominal: Soft, NT, ND, bowel sounds + Extremities: no edema, no cyanosis    The results of significant diagnostics from this hospitalization (including imaging, microbiology, ancillary and laboratory) are listed below for reference.     Microbiology: Recent Results (from the past 240 hour(s))  MRSA PCR Screening     Status: None   Collection Time: 09/26/16 11:37 AM  Result Value Ref Range Status   MRSA by PCR NEGATIVE NEGATIVE Final    Comment:        The GeneXpert MRSA Assay (FDA approved for NASAL specimens only), is one component of a comprehensive MRSA colonization surveillance program. It is not intended to diagnose MRSA infection nor to guide or monitor treatment for MRSA infections.      Labs: BNP (last 3 results)  Recent Labs  01/10/16 1834 05/16/16 1740 05/23/16 1558  BNP 182.8* 662.2* 299.0*   Basic Metabolic Panel:  Recent Labs Lab 09/26/16 0001 09/26/16 0448 09/26/16 1053 09/27/16 0752  NA 133*  --  136 135  K >7.5* 5.7* 4.1 4.4  CL 95*  --  93* 95*  CO2 18*  --  24 21*  GLUCOSE  255*  --  174* 130*  BUN 123*  --  57* 73*  CREATININE 15.23*  --  8.99* 10.97*  CALCIUM 8.4*  --  8.7* 8.3*  PHOS  --   --   --  11.4*   Liver Function Tests:  Recent Labs Lab 09/27/16 0752  ALBUMIN 3.2*   No results for input(s): LIPASE, AMYLASE in the last 168 hours. No results for input(s): AMMONIA in the last 168 hours. CBC:  Recent Labs Lab 09/26/16 0001 09/26/16 1053 09/27/16 0752  WBC 10.8* 7.7 6.0  NEUTROABS 9.5*  --   --   HGB 10.5* 10.6* 9.9*  HCT 32.9* 32.6* 30.2*  MCV 88.9 87.4 87.0  PLT 293 237 244   Cardiac Enzymes: No results for input(s): CKTOTAL, CKMB, CKMBINDEX, TROPONINI in the last 168 hours. BNP: Invalid input(s): POCBNP CBG:  Recent Labs Lab 09/27/16 1652 09/27/16 2018 09/28/16 0000 09/28/16 0429 09/28/16 0740  GLUCAP 315* 151* 149* 118* 136*   D-Dimer No results for input(s): DDIMER in the last 72 hours. Hgb A1c No results for input(s): HGBA1C in the last 72 hours. Lipid Profile No results for input(s): CHOL, HDL, LDLCALC, TRIG, CHOLHDL, LDLDIRECT in the last 72 hours. Thyroid function studies  Recent Labs  09/27/16 1407  TSH 5.094*   Anemia work up No results for input(s): VITAMINB12, FOLATE, FERRITIN, TIBC, IRON, RETICCTPCT in  the last 72 hours. Urinalysis    Component Value Date/Time   COLORURINE YELLOW 05/18/2016 2110   APPEARANCEUR HAZY (A) 05/18/2016 2110   LABSPEC 1.018 05/18/2016 2110   PHURINE 7.0 05/18/2016 2110   GLUCOSEU >=500 (A) 05/18/2016 2110   HGBUR NEGATIVE 05/18/2016 2110   BILIRUBINUR NEGATIVE 05/18/2016 2110   BILIRUBINUR neg 05/26/2014 1956   KETONESUR NEGATIVE 05/18/2016 2110   PROTEINUR >=300 (A) 05/18/2016 2110   UROBILINOGEN 0.2 09/30/2014 1730   NITRITE NEGATIVE 05/18/2016 2110   LEUKOCYTESUR NEGATIVE 05/18/2016 2110   Sepsis Labs Invalid input(s): PROCALCITONIN,  WBC,  LACTICIDVEN Microbiology Recent Results (from the past 240 hour(s))  MRSA PCR Screening     Status: None   Collection  Time: 09/26/16 11:37 AM  Result Value Ref Range Status   MRSA by PCR NEGATIVE NEGATIVE Final    Comment:        The GeneXpert MRSA Assay (FDA approved for NASAL specimens only), is one component of a comprehensive MRSA colonization surveillance program. It is not intended to diagnose MRSA infection nor to guide or monitor treatment for MRSA infections.      Time coordinating discharge:  30 minutes  SIGNED:   Maxie Barb, MD  [facility] 09/28/2016, 10:32 AM  If 7PM-7AM, please contact night-coverage [email or web address] Password [email or web address]

## 2016-09-28 NOTE — Progress Notes (Signed)
[facility] Progress Note   Dialysis Orders: [facility] MWF EDW 118 2 K 2.5 Ca std heparin with mid dose left lower AVF 400/800 Mircera 75 q 2 wk - last 9/12 calcitriol 0.5 sensipar 30 venofer 50  Assessment/Plan: 1. Volume overload due to missed HD - last treatment 9/19 prior to admission - net UF 5 L 9/25 and 3.3 9/26 with post wt 117 (bed scale) 2. ESRD with hyperkalemia upon admission > 7.5 due to missed HD- resolved with HD -MWF - HD Friday if not d/c today  3. Anemia - hgb 9.9 - due for redose ESA - continue weekly venofer 4. Secondary hyperparathyroidism - P elevated 11.4- increased calcitriol to outpatient dose /sensipar recently started - P controll an ongoing outpatient challenge that will be further addressed after d/c 5. HTN/volume - BP variable - volume down with emergent and serial HD - lower edw some for d/c 6. Nutrition - alb 3.2 7. DM - poor control - last outpt A1c 11.7 - needs f/u with primary after d/c 8. Missed HD treatments - had a serious discussion about life threatening risk of missing HD - flew to [place] for vacation and ultimately had to come to the hospital from the [facility] via ambulance for emergent HD. Stressed this could have been the LAST trip he ever took with his fiance.  [name], PA-C [facility] [phone] 09/28/2016,9:09 AM  LOS: 2 days   Subjective:   No complaints  Objective Vitals:   09/27/16 1157 09/27/16 1900 09/27/16 2034 09/28/16 0434  BP: (!) 141/83 131/62 126/84 (!) 166/100  Pulse: 79 77 77 80  Resp: Temp: 97.8 F (36.6 C) 98.3 F (36.8 C) 98.4 F (36.9 C) 98 F (36.7 C)  TempSrc: Oral Oral Oral Oral  SpO2: 97% 97% 96% 95%  Weight:   116.6 kg (257 lb)   Height:       Physical Exam General: NAD breathing easily Heart: RRR Lungs: no rales Abdomen: obese soft Extremities: no LE edema Dialysis Access:  Left AVF + bruit   Additional Objective Labs: Basic Metabolic  Panel:  Recent Labs Lab 09/26/16 0001 09/26/16 0448 09/26/16 1053 09/27/16 0752  NA 133*  --  136 135  K >7.5* 5.7* 4.1 4.4  CL 95*  --  93* 95*  CO2 18*  --  24 21*  GLUCOSE 255*  --  174* 130*  BUN 123*  --  57* 73*  CREATININE 15.23*  --  8.99* 10.97*  CALCIUM 8.4*  --  8.7* 8.3*  PHOS  --   --   --  11.4*    Recent Labs Lab 09/27/16 0752  ALBUMIN 3.2*   CBC:  Recent Labs Lab 09/26/16 0001 09/26/16 1053 09/27/16 0752  WBC 10.8* 7.7 6.0  NEUTROABS 9.5*  --   --   HGB 10.5* 10.6* 9.9*  HCT 32.9* 32.6* 30.2*  MCV 88.9 87.4 87.0  PLT 293 237 244  CBG:  Recent Labs Lab 09/27/16 1652 09/27/16 2018 09/28/16 0000 09/28/16 0429 09/28/16 0740  GLUCAP 315* 151* 149* 118* 136*   Iron Studies: No results for input(s): IRON, TIBC, TRANSFERRIN, FERRITIN in the last 72 hours. Lab Results  Component Value Date   INR 1.0 09/12/2016   INR 1.25 05/16/2016   INR 1.12 04/27/2016   Studies/Results: No results found. Medications: . sodium chloride    . sodium chloride     . amLODipine  10 mg Oral  Once per day on Sun Tue Thu Sat  . aspirin EC  81 mg Oral Daily  . atorvastatin  80 mg Oral q1800  . calcitRIOL  0.25 mcg Oral Q M,W,F  . calcium acetate  2,001 mg Oral TID WC  . cinacalcet  30 mg Oral Q M,W,F  . citalopram  40 mg Oral QHS  . clonazePAM  2 mg Oral QHS  . clopidogrel  75 mg Oral Daily  . enoxaparin (LOVENOX) injection  30 mg Subcutaneous Q24H  . insulin aspart  0-15 Units Subcutaneous Q4H  . insulin glargine  30 Units Subcutaneous Daily  . [START ON 09/29/2016] levothyroxine  75 mcg Oral QAC breakfast  . metoprolol tartrate  100 mg Oral BID    I have seen and examined this patient and agree with plan and assessment in the above note with renal recommendations/intervention highlighted.  Pt feels much better and again we discussed the risks of noncompliance with HD.  He voiced understanding and vows to "never do that again".  Stable for discharge to home  and f/u with outpatient dialysis tomorrow.  Jomarie Longs A Kortne All,MD 09/28/2016 11:09 AM

## 2016-09-28 NOTE — Plan of Care (Signed)
Problem: Pain Managment: Goal: General experience of comfort will improve Outcome: Progressing Implement pain control measures  Problem: Physical Regulation: Goal: Ability to maintain clinical measurements within normal limits will improve Outcome: Progressing Monitor response to tx Goal: Will remain free from infection Outcome: Progressing Provide infection prevention measures  Problem: Skin Integrity: Goal: Risk for impaired skin integrity will decrease Outcome: Progressing Encourage  Skin care  Problem: Tissue Perfusion: Goal: Risk factors for ineffective tissue perfusion will decrease Outcome: Progressing Assess for VTE status on going  Problem: Activity: Goal: Risk for activity intolerance will decrease Outcome: Progressing Provide rest periods  Problem: Fluid Volume: Goal: Ability to maintain a balanced intake and output will improve Outcome: Progressing Implement fluid volume status (on strict intake and output recording)  Problem: Nutrition: Goal: Adequate nutrition will be maintained Outcome: Progressing Monitor nutritional status  Problem: Bowel/Gastric: Goal: Will not experience complications related to bowel motility Outcome: Progressing Assess baseline bowel pattern

## 2016-09-29 ENCOUNTER — Ambulatory Visit: Payer: Medicare Other | Admitting: Physician Assistant

## 2016-09-29 ENCOUNTER — Telehealth: Payer: Self-pay | Admitting: Family Medicine

## 2016-09-29 DIAGNOSIS — D631 Anemia in chronic kidney disease: Secondary | ICD-10-CM | POA: Diagnosis not present

## 2016-09-29 DIAGNOSIS — N186 End stage renal disease: Secondary | ICD-10-CM | POA: Diagnosis not present

## 2016-09-29 DIAGNOSIS — E211 Secondary hyperparathyroidism, not elsewhere classified: Secondary | ICD-10-CM | POA: Diagnosis not present

## 2016-09-29 DIAGNOSIS — Z23 Encounter for immunization: Secondary | ICD-10-CM | POA: Diagnosis not present

## 2016-09-29 DIAGNOSIS — N2581 Secondary hyperparathyroidism of renal origin: Secondary | ICD-10-CM | POA: Diagnosis not present

## 2016-09-29 NOTE — Telephone Encounter (Signed)
THIS MESSAGE IS FROM LOTTIE (RN) AT [facility] FOR DR. SHAW: PATIENT WILL BE D/C FROM THE HOSPITAL TODAY (09/29/16). HE WAS ON HIS WAY TO [place] AND DID NOT HAVE HIS DIALYSIS. HIS POTASSIUM WAS MORE THAN 7.5. HE IS NOW STABLE BUT WEAK. [facility] HAS ORDERS TO PROVIDE NURSING, P.T. AND O.T. THEY DO NOT DO ANYTHING WITH DIALYSIS SO THE PATIENT ONLY NEEDS P.T. AND O.T. (PLEASE GIVE A VERBAL ORDER TO D/S NURSING). BEST PHONE [phone] (LOTTIE RN AT [facility]). MBC

## 2016-09-29 NOTE — Progress Notes (Deleted)
Cardiology Office Note    Date:  09/29/2016   ID:  Russell [name], DOB 1970-04-29, MRN [id number]  PCP:  [name], MD  Cardiologist:  Dr. Jens Som  No chief complaint on file.   History of Present Illness:  Russell [name] is a 46 y.o. male with PMH of asthma, CAD, h/o DVT, ESRD on dialysis MWF, HTN, hypothyroidism, and DM II. Renal Doppler in May 2016 was normal. He had bilateral DVT around 2 years ago and completed 6 months of Xarelto therapy. Echocardiogram in September 2017 showed normal LV function, moderate LVH. He had a cardiac catheterization in September 2017 showed 90% ramus, 95% first diagonal, 80% proximal LAD and normal LV function. He subsequently had CABG with LIMA to LAD, SVG to diagonal, SVG to obtuse marginal. He was last seen by Dr. Jens Som on 01/27/2016, 6 month follow-up was recommended. Dr. Jens Som was thinking about discontinuing his Plavix during the next office visit. He was admitted in May for acute on chronic respiratory failure secondary to fluid overload, this was felt to be secondary to non-adherence to his peritoneal dialysis at home. Echocardiogram showed EF 55%, no wall motion abnormality, grade 2 diastolic dysfunction, moderate pulmonary hypertension. CTA was negative for PE. Unfortunately, he returned to the hospital on 05/23/2016 with recurrent chest pain. The chest pain was felt to be atypical and pleuritic. Initial troponin was negative. A limited echo was repeated on 05/17/2016 to check for prior cardial effusion and rule out pericarditis. Echocardiogram obtained on that day showed EF 55%, grade 2 diastolic dysfunction, mild LVH, poorly visualized right ventricle, PA peak pressure 55 mmHg.    Past Medical History:  Diagnosis Date  . Anxiety   . Asthma   . 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 Daily headache     . Depression   . DVT (deep venous thrombosis), H/o 01/2014-on Xarelto 03/24/2014   LLE  . ESRD (end stage renal disease) on dialysis [facility])    "[place], MWF" (06/29/2016)  . Hypertension   . Hypothyroidism   . Nephrotic syndrome 05/18/2014  . Type 2 diabetes mellitus with diabetic nephropathy [facility])     Past Surgical History:  Procedure Laterality Date  . ANKLE FRACTURE SURGERY Right 1988  . AV FISTULA PLACEMENT Left 01/01/2015   Procedure: CREATION OF LEFT RADIAL CEPHALIC ARTERIOVENOUS (AV) FISTULA ;  Surgeon: Pryor Ochoa, MD;  Location: [facility] OR;  Service: Vascular;  Laterality: Left;  . CAPD REMOVAL N/A 05/19/2016   Procedure: PD CATH REMOVAL;  Surgeon: Abigail Miyamoto, MD;  Location: [facility] OR;  Service: General;  Laterality: N/A;  . CARDIAC CATHETERIZATION N/A 09/22/2015   Procedure: Left Heart Cath and Coronary Angiography;  Surgeon: Iran Ouch, MD;  Location: [facility] INVASIVE CV LAB;  Service: Cardiovascular;  Laterality: N/A;  . CORONARY ARTERY BYPASS GRAFT N/A 09/28/2015   Procedure: CORONARY ARTERY BYPASS GRAFTING (CABG) x 3 UTILIZING LEFT MAMMARY ARTERY AND ENDOSCOPICALLY HARVESTED LEFT GREATER SAPHENOUS VEIN.;  Surgeon: Delight Ovens, MD;  Location: [facility] OR;  Service: Open Heart Surgery;  Laterality: N/A;  . ENDOVEIN HARVEST OF GREATER SAPHENOUS VEIN Left 09/28/2015   Procedure: ENDOVEIN HARVEST OF GREATER SAPHENOUS VEIN;  Surgeon: Delight Ovens, MD;  Location: [facility] OR;  Service: Open Heart Surgery;  Laterality: Left;  . ESOPHAGOGASTRODUODENOSCOPY N/A 03/29/2014   Procedure: ESOPHAGOGASTRODUODENOSCOPY (EGD);  Surgeon: Dorena Cookey, MD;  Location: [facility] ENDOSCOPY;  Service: Endoscopy;  Laterality: N/A;  . EYE SURGERY    . FRACTURE SURGERY    . INSERTION OF DIALYSIS CATHETER Right 01/05/2015   Procedure: INSERTION OF RIGHT INTERNAL JUGULAR DIALYSIS CATHETER;  Surgeon: Fransisco Hertz, MD;  Location: [facility] OR;  Service: Vascular;  Laterality: Right;  . LEFT HEART CATH AND CORS/GRAFTS  ANGIOGRAPHY N/A 09/13/2016   Procedure: LEFT HEART CATH AND CORS/GRAFTS ANGIOGRAPHY;  Surgeon: Kathleene Hazel, MD;  Location: [facility] INVASIVE CV LAB;  Service: Cardiovascular;  Laterality: N/A;  . RETINAL LASER PROCEDURE Bilateral   . TEE WITHOUT CARDIOVERSION N/A 09/28/2015   Procedure: TRANSESOPHAGEAL ECHOCARDIOGRAM (TEE);  Surgeon: Delight Ovens, MD;  Location: [facility] OR;  Service: Open Heart Surgery;  Laterality: N/A;  . TONSILLECTOMY AND ADENOIDECTOMY  1970s    Current Medications: Outpatient Medications Prior to Visit  Medication Sig Dispense Refill  . acetaminophen (TYLENOL) 500 MG tablet Take 1 tablet (500 mg total) by mouth every 6 (six) hours as needed for moderate pain. (Patient taking differently: Take 1,000 mg by mouth every 6 (six) hours as needed (for pain). ) 30 tablet 0  . albuterol (PROVENTIL HFA;VENTOLIN HFA) 108 (90 Base) MCG/ACT inhaler Inhale 1 puff into the lungs every 6 (six) hours as needed for wheezing or shortness of breath. (Patient taking differently: Inhale 2 puffs into the lungs every 8 (eight) hours as needed for wheezing or shortness of breath. ) 1 Inhaler 11  . albuterol (PROVENTIL) (2.5 MG/3ML) 0.083% nebulizer solution Take 3 mLs (2.5 mg total) by nebulization every 4 (four) hours as needed for wheezing. 150 mL 2  . albuterol-ipratropium (COMBIVENT) 18-103 MCG/ACT inhaler Inhale 2 puffs into the lungs every 4 (four) hours. (Patient taking differently: Inhale 2 puffs into the lungs 3 (three) times daily as needed for wheezing or shortness of breath. ) 1 Inhaler 11  . amLODipine (NORVASC) 10 MG tablet Take 1 tablet (10 mg total) by mouth daily. DO NOT TAKE ON DIALYSIS DAYS 90 tablet 1  . aspirin EC 81 MG EC tablet Take 1 tablet (81 mg total) by mouth daily.    Marland Kitchen atorvastatin (LIPITOR) 80 MG tablet Take 1 tablet (80 mg total) by mouth daily at 6 PM. 90 tablet 1  . calcitRIOL (ROCALTROL) 0.25 MCG capsule Take 1 capsule (0.25 mcg total) by mouth every Monday,  Wednesday, and Friday with hemodialysis. (Patient taking differently: Take 0.25 mcg by mouth See admin instructions. Tuesday Thursday Saturday) 30 capsule 3  . calcium acetate (PHOSLO) 667 MG capsule Take 2 capsules (1,334 mg total) by mouth 3 (three) times daily with meals. (Patient taking differently: Take 1,334-2,001 mg by mouth See admin instructions. Take 2001 mg by mouth 3 times daily with meals and take 1334 mg by mouth with snacks) 180 capsule 0  . cinacalcet (SENSIPAR) 30 MG tablet Take 30 mg by mouth every Monday, Wednesday, and Friday.    . citalopram (CELEXA) 40 MG tablet Take 40 mg by mouth at bedtime.     . clonazePAM (KLONOPIN) 2 MG tablet Take 2 mg by mouth daily.    . clopidogrel (PLAVIX) 75 MG tablet Take 1 tablet (75 mg total) by mouth daily. 90 tablet 3  . cyclobenzaprine (FLEXERIL) 10 MG tablet Take 1 tablet (10 mg total) by mouth at bedtime. 30 tablet 11  . gabapentin (NEURONTIN) 100 MG capsule Take 2 capsules (200 mg total) by mouth at bedtime. 30 capsule 0  . HYDROcodone-acetaminophen (NORCO/VICODIN)  5-325 MG tablet Take 1-2 tablets by mouth every 4 (four) hours as needed for moderate pain. 60 tablet 0  . HYDROmorphone (DILAUDID) 2 MG tablet Take 2 mg by mouth 3 (three) times daily.    . hydrOXYzine (ATARAX/VISTARIL) 25 MG tablet Take 25 mg by mouth 2 (two) times daily as needed for anxiety.     . insulin glargine (LANTUS) 100 UNIT/ML injection Inject 0.4 mLs (40 Units total) into the skin 2 (two) times daily. (Patient taking differently: Inject 30 Units into the skin daily. ) 10 mL 0  . insulin lispro (HUMALOG) 100 UNIT/ML injection Inject 0.15-0.3 mLs (15-30 Units total) into the skin every evening. Take one unit per 5 carbs depending on dinner. 10 mL 2  . levothyroxine (SYNTHROID, LEVOTHROID) 75 MCG tablet Take 1 tablet (75 mcg total) by mouth daily before breakfast. 30 tablet 0  . metoCLOPramide (REGLAN) 5 MG tablet Take 1 tablet (5 mg total) by mouth every 8 (eight) hours  as needed for nausea or vomiting. (Patient taking differently: Take 10 mg by mouth 3 (three) times daily. ) 30 tablet 0  . metoprolol tartrate (LOPRESSOR) 50 MG tablet Take 2 tablets (100 mg total) by mouth 2 (two) times daily.    . pantoprazole (PROTONIX) 40 MG tablet Take 1 tablet (40 mg total) by mouth daily. 14 tablet 0   No facility-administered medications prior to visit.      Allergies:   Ibuprofen; Nsaids; and Tape   Social History   Social History  . Marital status: Significant Other    Spouse name: N/A  . Number of children: N/A  . Years of education: N/A   Occupational History  .      Maintenance Curator   Social History Main Topics  . Smoking status: Never Smoker  . Smokeless tobacco: Never Used  . Alcohol use 0.0 oz/week     Comment: 06/29/2016 "might have a few drinks/year"  . Drug use: No  . Sexual activity: Yes   Other Topics Concern  . Not on file   Social History Narrative   Patient currently lives with his fiance   Works with facilities   Nonsmoker, nondrinker        Family History:  The patient's ***family history includes Cancer (age of onset: 40) in his father; Heart disease in his unknown relative; Kidney cancer in his maternal grandmother; Obesity in his mother.   ROS:   Please see the history of present illness.    ROS All other systems reviewed and are negative.   PHYSICAL EXAM:   VS:  There were no vitals taken for this visit.   GEN: Well nourished, well developed, in no acute distress  HEENT: normal  Neck: no JVD, carotid bruits, or masses Cardiac: ***RRR; no murmurs, rubs, or gallops,no edema  Respiratory:  clear to auscultation bilaterally, normal work of breathing GI: soft, nontender, nondistended, + BS MS: no deformity or atrophy  Skin: warm and dry, no rash Neuro:  Alert and Oriented x 3, Strength and sensation are intact Psych: euthymic mood, full affect  Wt Readings from Last 3 Encounters:  09/27/16 257 lb (116.6 kg)    09/13/16 262 lb 5.6 oz (119 kg)  09/12/16 264 lb 12.8 oz (120.1 kg)      Studies/Labs Reviewed:   EKG:  EKG is*** ordered today.  The ekg ordered today demonstrates ***  Recent Labs: 05/23/2016: B Natriuretic Peptide 299.0 06/30/2016: Magnesium 2.4 08/17/2016: ALT 12 09/27/2016: BUN 73; Creatinine, Ser  10.97; Hemoglobin 9.9; Platelets 244; Potassium 4.4; Sodium 135; TSH 5.094   Lipid Panel    Component Value Date/Time   CHOL 196 05/17/2016 0208   CHOL 228 (H) 04/17/2016 0900   TRIG 246 (H) 05/17/2016 0208   HDL 32 (L) 05/17/2016 0208   HDL 33 (L) 04/17/2016 0900   CHOLHDL 6.1 05/17/2016 0208   VLDL 49 (H) 05/17/2016 0208   LDLCALC 115 (H) 05/17/2016 0208   LDLCALC 126 (H) 04/17/2016 0900    Additional studies/ records that were reviewed today include:  ***    ASSESSMENT:    No diagnosis found.   PLAN:  In order of problems listed above:  1. ***    Medication Adjustments/Labs and Tests Ordered: Current medicines are reviewed at length with the patient today.  Concerns regarding medicines are outlined above.  Medication changes, Labs and Tests ordered today are listed in the Patient Instructions below. There are no Patient Instructions on file for this visit.   Ramond Dial, [place]  09/29/2016 1:23 PM    [facility] [address] [place], [place], [place]  [address] Phone: [phone]; Fax: [phone]

## 2016-10-01 DIAGNOSIS — N049 Nephrotic syndrome with unspecified morphologic changes: Secondary | ICD-10-CM | POA: Diagnosis not present

## 2016-10-01 DIAGNOSIS — Z992 Dependence on renal dialysis: Secondary | ICD-10-CM | POA: Diagnosis not present

## 2016-10-01 DIAGNOSIS — N186 End stage renal disease: Secondary | ICD-10-CM | POA: Diagnosis not present

## 2016-10-02 DIAGNOSIS — N2581 Secondary hyperparathyroidism of renal origin: Secondary | ICD-10-CM | POA: Diagnosis not present

## 2016-10-02 DIAGNOSIS — N186 End stage renal disease: Secondary | ICD-10-CM | POA: Diagnosis not present

## 2016-10-02 DIAGNOSIS — E211 Secondary hyperparathyroidism, not elsewhere classified: Secondary | ICD-10-CM | POA: Diagnosis not present

## 2016-10-04 DIAGNOSIS — E1129 Type 2 diabetes mellitus with other diabetic kidney complication: Secondary | ICD-10-CM | POA: Diagnosis not present

## 2016-10-04 DIAGNOSIS — D631 Anemia in chronic kidney disease: Secondary | ICD-10-CM | POA: Diagnosis not present

## 2016-10-04 DIAGNOSIS — N2581 Secondary hyperparathyroidism of renal origin: Secondary | ICD-10-CM | POA: Diagnosis not present

## 2016-10-04 DIAGNOSIS — Z23 Encounter for immunization: Secondary | ICD-10-CM | POA: Diagnosis not present

## 2016-10-04 DIAGNOSIS — N186 End stage renal disease: Secondary | ICD-10-CM | POA: Diagnosis not present

## 2016-10-04 DIAGNOSIS — D509 Iron deficiency anemia, unspecified: Secondary | ICD-10-CM | POA: Diagnosis not present

## 2016-10-06 DIAGNOSIS — D509 Iron deficiency anemia, unspecified: Secondary | ICD-10-CM | POA: Diagnosis not present

## 2016-10-06 DIAGNOSIS — E1129 Type 2 diabetes mellitus with other diabetic kidney complication: Secondary | ICD-10-CM | POA: Diagnosis not present

## 2016-10-06 DIAGNOSIS — D631 Anemia in chronic kidney disease: Secondary | ICD-10-CM | POA: Diagnosis not present

## 2016-10-06 DIAGNOSIS — N2581 Secondary hyperparathyroidism of renal origin: Secondary | ICD-10-CM | POA: Diagnosis not present

## 2016-10-06 DIAGNOSIS — Z23 Encounter for immunization: Secondary | ICD-10-CM | POA: Diagnosis not present

## 2016-10-06 DIAGNOSIS — N186 End stage renal disease: Secondary | ICD-10-CM | POA: Diagnosis not present

## 2016-10-06 NOTE — Telephone Encounter (Signed)
That's fine but then pt needs to come in for hosp f/u w/in the next week considering he was d'c from hosp 1 week ago.

## 2016-10-06 NOTE — Telephone Encounter (Signed)
Verbal order given  

## 2016-10-06 NOTE — Telephone Encounter (Signed)
Attempted to call pt to ensure follow up. Mailbox is full.

## 2016-10-09 ENCOUNTER — Telehealth: Payer: Self-pay | Admitting: Family Medicine

## 2016-10-09 NOTE — Telephone Encounter (Signed)
noted 

## 2016-10-09 NOTE — Telephone Encounter (Signed)
Pt physical therapist called stating that the patient is coming back into town today and will be rescheduling his appt next week   Home health nurse wanted Clelia Croft to know

## 2016-10-10 ENCOUNTER — Telehealth: Payer: Self-pay | Admitting: Family Medicine

## 2016-10-10 DIAGNOSIS — N2581 Secondary hyperparathyroidism of renal origin: Secondary | ICD-10-CM | POA: Diagnosis not present

## 2016-10-10 DIAGNOSIS — Z23 Encounter for immunization: Secondary | ICD-10-CM | POA: Diagnosis not present

## 2016-10-10 DIAGNOSIS — N186 End stage renal disease: Secondary | ICD-10-CM | POA: Diagnosis not present

## 2016-10-10 DIAGNOSIS — D631 Anemia in chronic kidney disease: Secondary | ICD-10-CM | POA: Diagnosis not present

## 2016-10-10 DIAGNOSIS — E1129 Type 2 diabetes mellitus with other diabetic kidney complication: Secondary | ICD-10-CM | POA: Diagnosis not present

## 2016-10-10 DIAGNOSIS — D509 Iron deficiency anemia, unspecified: Secondary | ICD-10-CM | POA: Diagnosis not present

## 2016-10-10 NOTE — Telephone Encounter (Signed)
Please call if there are any questions. Burna Mortimer [phone]

## 2016-10-10 NOTE — Telephone Encounter (Signed)
Russell [name] from [facility] wants to Dr. Clelia Croft to pt has moved to [place]. She would like a PT stater order for 10/12/16. Please call if there are any

## 2016-10-11 DIAGNOSIS — D509 Iron deficiency anemia, unspecified: Secondary | ICD-10-CM | POA: Diagnosis not present

## 2016-10-11 DIAGNOSIS — Z23 Encounter for immunization: Secondary | ICD-10-CM | POA: Diagnosis not present

## 2016-10-11 DIAGNOSIS — D631 Anemia in chronic kidney disease: Secondary | ICD-10-CM | POA: Diagnosis not present

## 2016-10-11 DIAGNOSIS — N2581 Secondary hyperparathyroidism of renal origin: Secondary | ICD-10-CM | POA: Diagnosis not present

## 2016-10-11 DIAGNOSIS — N186 End stage renal disease: Secondary | ICD-10-CM | POA: Diagnosis not present

## 2016-10-11 DIAGNOSIS — E1129 Type 2 diabetes mellitus with other diabetic kidney complication: Secondary | ICD-10-CM | POA: Diagnosis not present

## 2016-10-13 ENCOUNTER — Other Ambulatory Visit: Payer: Self-pay

## 2016-10-13 ENCOUNTER — Inpatient Hospital Stay (HOSPITAL_COMMUNITY)
Admission: EM | Admit: 2016-10-13 | Discharge: 2016-10-15 | DRG: 304 | Disposition: A | Discharge status: Home Health | Payer: Medicare Other | Attending: Internal Medicine | Admitting: Internal Medicine

## 2016-10-13 ENCOUNTER — Emergency Department (HOSPITAL_COMMUNITY): Payer: Medicare Other

## 2016-10-13 ENCOUNTER — Encounter (HOSPITAL_COMMUNITY): Payer: Self-pay | Admitting: *Deleted

## 2016-10-13 DIAGNOSIS — E1121 Type 2 diabetes mellitus with diabetic nephropathy: Secondary | ICD-10-CM | POA: Diagnosis not present

## 2016-10-13 DIAGNOSIS — IMO0002 Reserved for concepts with insufficient information to code with codable children: Secondary | ICD-10-CM | POA: Diagnosis present

## 2016-10-13 DIAGNOSIS — I161 Hypertensive emergency: Principal | ICD-10-CM | POA: Diagnosis present

## 2016-10-13 DIAGNOSIS — E669 Obesity, unspecified: Secondary | ICD-10-CM | POA: Diagnosis present

## 2016-10-13 DIAGNOSIS — E1151 Type 2 diabetes mellitus with diabetic peripheral angiopathy without gangrene: Secondary | ICD-10-CM

## 2016-10-13 DIAGNOSIS — N2581 Secondary hyperparathyroidism of renal origin: Secondary | ICD-10-CM | POA: Diagnosis not present

## 2016-10-13 DIAGNOSIS — J45909 Unspecified asthma, uncomplicated: Secondary | ICD-10-CM | POA: Diagnosis present

## 2016-10-13 DIAGNOSIS — G8929 Other chronic pain: Secondary | ICD-10-CM | POA: Diagnosis not present

## 2016-10-13 DIAGNOSIS — Z6833 Body mass index (BMI) 33.0-33.9, adult: Secondary | ICD-10-CM | POA: Diagnosis not present

## 2016-10-13 DIAGNOSIS — R0602 Shortness of breath: Secondary | ICD-10-CM

## 2016-10-13 DIAGNOSIS — R079 Chest pain, unspecified: Secondary | ICD-10-CM

## 2016-10-13 DIAGNOSIS — Z886 Allergy status to analgesic agent status: Secondary | ICD-10-CM

## 2016-10-13 DIAGNOSIS — I2510 Atherosclerotic heart disease of native coronary artery without angina pectoris: Secondary | ICD-10-CM | POA: Diagnosis not present

## 2016-10-13 DIAGNOSIS — D509 Iron deficiency anemia, unspecified: Secondary | ICD-10-CM | POA: Diagnosis not present

## 2016-10-13 DIAGNOSIS — Z7902 Long term (current) use of antithrombotics/antiplatelets: Secondary | ICD-10-CM

## 2016-10-13 DIAGNOSIS — I160 Hypertensive urgency: Secondary | ICD-10-CM

## 2016-10-13 DIAGNOSIS — R0789 Other chest pain: Secondary | ICD-10-CM | POA: Diagnosis not present

## 2016-10-13 DIAGNOSIS — Z951 Presence of aortocoronary bypass graft: Secondary | ICD-10-CM

## 2016-10-13 DIAGNOSIS — I10 Essential (primary) hypertension: Secondary | ICD-10-CM | POA: Diagnosis not present

## 2016-10-13 DIAGNOSIS — Z992 Dependence on renal dialysis: Secondary | ICD-10-CM | POA: Diagnosis not present

## 2016-10-13 DIAGNOSIS — Z7982 Long term (current) use of aspirin: Secondary | ICD-10-CM

## 2016-10-13 DIAGNOSIS — R51 Headache: Secondary | ICD-10-CM | POA: Diagnosis present

## 2016-10-13 DIAGNOSIS — N186 End stage renal disease: Secondary | ICD-10-CM | POA: Diagnosis not present

## 2016-10-13 DIAGNOSIS — Z79891 Long term (current) use of opiate analgesic: Secondary | ICD-10-CM

## 2016-10-13 DIAGNOSIS — E039 Hypothyroidism, unspecified: Secondary | ICD-10-CM

## 2016-10-13 DIAGNOSIS — D631 Anemia in chronic kidney disease: Secondary | ICD-10-CM | POA: Diagnosis not present

## 2016-10-13 DIAGNOSIS — Z86718 Personal history of other venous thrombosis and embolism: Secondary | ICD-10-CM

## 2016-10-13 DIAGNOSIS — Z91048 Other nonmedicinal substance allergy status: Secondary | ICD-10-CM

## 2016-10-13 DIAGNOSIS — Z794 Long term (current) use of insulin: Secondary | ICD-10-CM

## 2016-10-13 DIAGNOSIS — E1165 Type 2 diabetes mellitus with hyperglycemia: Secondary | ICD-10-CM | POA: Diagnosis not present

## 2016-10-13 DIAGNOSIS — F419 Anxiety disorder, unspecified: Secondary | ICD-10-CM | POA: Diagnosis present

## 2016-10-13 DIAGNOSIS — E8889 Other specified metabolic disorders: Secondary | ICD-10-CM | POA: Diagnosis present

## 2016-10-13 DIAGNOSIS — E1122 Type 2 diabetes mellitus with diabetic chronic kidney disease: Secondary | ICD-10-CM | POA: Diagnosis present

## 2016-10-13 DIAGNOSIS — Z9119 Patient's noncompliance with other medical treatment and regimen: Secondary | ICD-10-CM

## 2016-10-13 DIAGNOSIS — E8770 Fluid overload, unspecified: Secondary | ICD-10-CM | POA: Diagnosis present

## 2016-10-13 DIAGNOSIS — E1129 Type 2 diabetes mellitus with other diabetic kidney complication: Secondary | ICD-10-CM | POA: Diagnosis not present

## 2016-10-13 DIAGNOSIS — Z9115 Patient's noncompliance with renal dialysis: Secondary | ICD-10-CM

## 2016-10-13 DIAGNOSIS — I120 Hypertensive chronic kidney disease with stage 5 chronic kidney disease or end stage renal disease: Secondary | ICD-10-CM | POA: Diagnosis present

## 2016-10-13 DIAGNOSIS — I517 Cardiomegaly: Secondary | ICD-10-CM | POA: Diagnosis present

## 2016-10-13 DIAGNOSIS — I252 Old myocardial infarction: Secondary | ICD-10-CM

## 2016-10-13 DIAGNOSIS — Z79899 Other long term (current) drug therapy: Secondary | ICD-10-CM

## 2016-10-13 DIAGNOSIS — Z23 Encounter for immunization: Secondary | ICD-10-CM | POA: Diagnosis not present

## 2016-10-13 DIAGNOSIS — Z87441 Personal history of nephrotic syndrome: Secondary | ICD-10-CM

## 2016-10-13 LAB — BASIC METABOLIC PANEL
ANION GAP: 12 (ref 5–15)
BUN: 25 mg/dL — ABNORMAL HIGH (ref 6–20)
CALCIUM: 9.2 mg/dL (ref 8.9–10.3)
CO2: 31 mmol/L (ref 22–32)
Chloride: 93 mmol/L — ABNORMAL LOW (ref 101–111)
Creatinine, Ser: 5.47 mg/dL — ABNORMAL HIGH (ref 0.61–1.24)
GFR, EST AFRICAN AMERICAN: 13 mL/min — AB (ref >60)
GFR, EST NON AFRICAN AMERICAN: 11 mL/min — AB (ref >60)
GLUCOSE: 343 mg/dL — AB (ref 65–99)
POTASSIUM: 4.1 mmol/L (ref 3.5–5.1)
Sodium: 136 mmol/L (ref 135–145)

## 2016-10-13 LAB — D-DIMER, QUANTITATIVE: D-Dimer, Quant: 1.69 ug/mL-FEU — ABNORMAL HIGH (ref 0.00–0.50)

## 2016-10-13 LAB — CBC
HCT: 35.2 % — ABNORMAL LOW (ref 39.0–52.0)
HEMOGLOBIN: 11.3 g/dL — AB (ref 13.0–17.0)
MCH: 28.1 pg (ref 26.0–34.0)
MCHC: 32.1 g/dL (ref 30.0–36.0)
MCV: 87.6 fL (ref 78.0–100.0)
Platelets: 260 10*3/uL (ref 150–400)
RBC: 4.02 MIL/uL — AB (ref 4.22–5.81)
RDW: 15.9 % — ABNORMAL HIGH (ref 11.5–15.5)
WBC: 7.5 10*3/uL (ref 4.0–10.5)

## 2016-10-13 LAB — I-STAT TROPONIN, ED
TROPONIN I, POC: 0.02 ng/mL (ref 0.00–0.08)
Troponin i, poc: 0.02 ng/mL (ref 0.00–0.08)

## 2016-10-13 LAB — TROPONIN I: TROPONIN I: 0.03 ng/mL — AB (ref <0.03)

## 2016-10-13 MED ORDER — ALBUTEROL SULFATE (2.5 MG/3ML) 0.083% IN NEBU
2.5000 mg | INHALATION_SOLUTION | Freq: Once | RESPIRATORY_TRACT | Status: AC
  Administered 2016-10-13: 2.5 mg via RESPIRATORY_TRACT
  Filled 2016-10-13: qty 3

## 2016-10-13 MED ORDER — METOCLOPRAMIDE HCL 5 MG/ML IJ SOLN
10.0000 mg | Freq: Once | INTRAMUSCULAR | Status: AC
  Administered 2016-10-13: 10 mg via INTRAVENOUS
  Filled 2016-10-13: qty 2

## 2016-10-13 MED ORDER — METOPROLOL TARTRATE 25 MG PO TABS
50.0000 mg | ORAL_TABLET | Freq: Once | ORAL | Status: AC
  Administered 2016-10-13: 50 mg via ORAL
  Filled 2016-10-13: qty 2

## 2016-10-13 MED ORDER — IPRATROPIUM-ALBUTEROL 0.5-2.5 (3) MG/3ML IN SOLN
3.0000 mL | Freq: Once | RESPIRATORY_TRACT | Status: AC
  Administered 2016-10-13: 3 mL via RESPIRATORY_TRACT
  Filled 2016-10-13: qty 3

## 2016-10-13 MED ORDER — HYDRALAZINE HCL 20 MG/ML IJ SOLN
5.0000 mg | Freq: Once | INTRAMUSCULAR | Status: AC
  Administered 2016-10-13: 5 mg via INTRAVENOUS
  Filled 2016-10-13: qty 1

## 2016-10-13 MED ORDER — ONDANSETRON HCL 4 MG/2ML IJ SOLN
4.0000 mg | Freq: Once | INTRAMUSCULAR | Status: AC
  Administered 2016-10-13: 4 mg via INTRAVENOUS
  Filled 2016-10-13: qty 2

## 2016-10-13 NOTE — Telephone Encounter (Signed)
Drew from [facility] called and said pt requested care to be transferred to [facility] so they are now taking care of the pt. He said to disregard the previous messages and [facility] will contact us if they need anything. Thanks!

## 2016-10-13 NOTE — ED Notes (Signed)
Dr. Duotova at bedside evaluating pt.  

## 2016-10-13 NOTE — ED Notes (Signed)
PA notified on pt.'s emesis , persistent chest pain/SOB and hypertension .

## 2016-10-13 NOTE — ED Triage Notes (Signed)
Pt in c/o mid CP with SOB today after HD, pt hx of CABG, pt diaphoretic, c/o nausea, denies v/d, A&O x4

## 2016-10-13 NOTE — ED Provider Notes (Signed)
[facility]-EMERGENCY DEPT Provider Note   CSN: [id number] Arrival date & time: 10/13/16  1639     History   Chief Complaint Chief Complaint  Patient presents with  . Chest Pain    HPI Russell [name] is a 46 y.o. male.  HPI Russell [name] is a 46 y.o. male with hx of CAD, ESRD on dialysis, DVT on xarelto, Asthma, anxiety, HTN, DM, presents to ED with Chest pressure and shortness of breath. Patient states his symptoms began yesterday. He states that he had dialysis today and believes it did not take enough fluid off of him. He states that he thought maybe with dialysis his shortness of breath will improve, but states that after dialysis he did not feel much better. He called his nephrologist who according to him told him to come to the ED for dialysis. Patient states he is having chest tightness as well. He states he also tried a breathing treatment yesterday, did not try one today. He reports that he took all of his medicines appropriately today. Denies any fever or chills. Denies any increased cough. Does admit to extremity swelling but states that is chronic. No other complaints.  Past Medical History:  Diagnosis Date  . Anxiety   . Asthma   . 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 Daily headache   . Depression   . DVT (deep venous thrombosis), H/o 01/2014-on Xarelto 03/24/2014   LLE  . ESRD (end stage renal disease) on dialysis [facility])    "[place], MWF" (06/29/2016)  . Hypertension   . Hypothyroidism   . Nephrotic syndrome 05/18/2014  . Type 2 diabetes mellitus with diabetic nephropathy [facility])     Patient Active Problem List   Diagnosis Date Noted  . Acute pulmonary edema (HCC)   . Anemia in chronic kidney disease, on chronic dialysis (HCC)   . Weakness   . Hyperkalemia 09/26/2016  . History of DVT of lower extremity 08/17/2016  .  Arteriosclerotic vascular disease 08/17/2016  . DKA, type 1 (HCC) 06/29/2016  . Nausea vomiting and diarrhea 06/29/2016  . Malignant hypertension 06/29/2016  . Chronic respiratory failure (HCC) 05/16/2016  . Anemia due to end stage renal disease (HCC) 05/16/2016  . S/P CABG (coronary artery bypass graft) 09/28/2015  . Coronary artery disease involving native coronary artery of native heart without angina pectoris   . ESRD on dialysis (HCC) 02/13/2015  . Nephrotic syndrome 01/03/2015  . Essential hypertension, benign   . Diabetic ketoacidosis without coma associated with type 2 diabetes mellitus (HCC)   . Thyroid activity decreased   . Hx of gastroesophageal reflux (GERD) 09/30/2014  . Diabetic neuropathy (HCC) 09/21/2014  . Uncontrolled diabetes mellitus type 2 with peripheral artery disease (HCC) 09/21/2014  . Morbid obesity (HCC) 09/21/2014  . Chest pain 04/22/2014  . Superficial thrombophlebitis 03/24/2014  . Hypothyroidism (acquired) 03/24/2014    Past Surgical History:  Procedure Laterality Date  . ANKLE FRACTURE SURGERY Right 1988  . AV FISTULA PLACEMENT Left 01/01/2015   Procedure: CREATION OF LEFT RADIAL CEPHALIC ARTERIOVENOUS (AV) FISTULA ;  Surgeon: Pryor Ochoa, MD;  Location: [facility] OR;  Service: Vascular;  Laterality: Left;  . CAPD REMOVAL N/A 05/19/2016   Procedure: PD CATH REMOVAL;  Surgeon: Abigail Miyamoto, MD;  Location: [facility] OR;  Service: General;  Laterality: N/A;  . CARDIAC CATHETERIZATION N/A 09/22/2015  Procedure: Left Heart Cath and Coronary Angiography;  Surgeon: Iran Ouch, MD;  Location: [facility] INVASIVE CV LAB;  Service: Cardiovascular;  Laterality: N/A;  . CORONARY ARTERY BYPASS GRAFT N/A 09/28/2015   Procedure: CORONARY ARTERY BYPASS GRAFTING (CABG) x 3 UTILIZING LEFT MAMMARY ARTERY AND ENDOSCOPICALLY HARVESTED LEFT GREATER SAPHENOUS VEIN.;  Surgeon: Delight Ovens, MD;  Location: [facility] OR;  Service: Open Heart Surgery;  Laterality: N/A;  . ENDOVEIN HARVEST  OF GREATER SAPHENOUS VEIN Left 09/28/2015   Procedure: ENDOVEIN HARVEST OF GREATER SAPHENOUS VEIN;  Surgeon: Delight Ovens, MD;  Location: [facility] OR;  Service: Open Heart Surgery;  Laterality: Left;  . ESOPHAGOGASTRODUODENOSCOPY N/A 03/29/2014   Procedure: ESOPHAGOGASTRODUODENOSCOPY (EGD);  Surgeon: Dorena Cookey, MD;  Location: [facility]COPY;  Service: Endoscopy;  Laterality: N/A;  . EYE SURGERY    . FRACTURE SURGERY    . INSERTION OF DIALYSIS CATHETER Right 01/05/2015   Procedure: INSERTION OF RIGHT INTERNAL JUGULAR DIALYSIS CATHETER;  Surgeon: Fransisco Hertz, MD;  Location: [facility] OR;  Service: Vascular;  Laterality: Right;  . LEFT HEART CATH AND CORS/GRAFTS ANGIOGRAPHY N/A 09/13/2016   Procedure: LEFT HEART CATH AND CORS/GRAFTS ANGIOGRAPHY;  Surgeon: Kathleene Hazel, MD;  Location: [facility] INVASIVE CV LAB;  Service: Cardiovascular;  Laterality: N/A;  . RETINAL LASER PROCEDURE Bilateral   . TEE WITHOUT CARDIOVERSION N/A 09/28/2015   Procedure: TRANSESOPHAGEAL ECHOCARDIOGRAM (TEE);  Surgeon: Delight Ovens, MD;  Location: [facility] OR;  Service: Open Heart Surgery;  Laterality: N/A;  . TONSILLECTOMY AND ADENOIDECTOMY  1970s       Home Medications    Prior to Admission medications   Medication Sig Start Date End Date Taking? Authorizing Provider  acetaminophen (TYLENOL) 500 MG tablet Take 1 tablet (500 mg total) by mouth every 6 (six) hours as needed for moderate pain. Patient taking differently: Take 1,000 mg by mouth every 6 (six) hours as needed (for pain).  09/29/14  Yes Albertine Grates, MD  albuterol (PROVENTIL HFA;VENTOLIN HFA) 108 (90 Base) MCG/ACT inhaler Inhale 1 puff into the lungs every 6 (six) hours as needed for wheezing or shortness of breath. Patient taking differently: Inhale 2 puffs into the lungs every 8 (eight) hours as needed for wheezing or shortness of breath.  03/11/15  Yes [name], MD  albuterol (PROVENTIL) (2.5 MG/3ML) 0.083% nebulizer solution Take 3 mLs (2.5 mg total) by  nebulization every 4 (four) hours as needed for wheezing. 09/20/16  Yes [name], MD  albuterol-ipratropium (COMBIVENT) 18-103 MCG/ACT inhaler Inhale 2 puffs into the lungs every 4 (four) hours. Patient taking differently: Inhale 2 puffs into the lungs 3 (three) times daily as needed for wheezing or shortness of breath.  09/03/14  Yes [name], MD  amLODipine (NORVASC) 10 MG tablet Take 1 tablet (10 mg total) by mouth daily. DO NOT TAKE ON DIALYSIS DAYS 08/17/16  Yes Lewayne Bunting, MD  aspirin EC 81 MG EC tablet Take 1 tablet (81 mg total) by mouth daily. 10/06/15  Yes Barrett, Erin R, PA-C  atorvastatin (LIPITOR) 80 MG tablet Take 1 tablet (80 mg total) by mouth daily at 6 PM. 04/17/16  Yes Shade Flood, MD  calcitRIOL (ROCALTROL) 0.25 MCG capsule Take 1 capsule (0.25 mcg total) by mouth every Monday, Wednesday, and Friday with hemodialysis. Patient taking differently: Take 0.25 mcg by mouth See admin instructions. Tuesday Thursday Saturday 10/06/15  Yes Barrett, Erin R, PA-C  calcium acetate (PHOSLO) 667 MG capsule Take 2 capsules (1,334 mg total) by mouth  3 (three) times daily with meals. Patient taking differently: Take 1,334-2,001 mg by mouth See admin instructions. Take 2001 mg by mouth 3 times daily with meals and take 1334 mg by mouth with snacks 01/08/15  Yes Rolly Salter, MD  cinacalcet [facility]) 30 MG tablet Take 30 mg by mouth every Monday, Wednesday, and Friday.   Yes [provider]  citalopram (CELEXA) 40 MG tablet Take 40 mg by mouth at bedtime.    Yes [provider]  clonazePAM (KLONOPIN) 2 MG tablet Take 2 mg by mouth daily.   Yes [provider]  clopidogrel (PLAVIX) 75 MG tablet Take 1 tablet (75 mg total) by mouth daily. Patient taking differently: Take 75 mg by mouth at bedtime.  01/27/16  Yes Lewayne Bunting, MD  cyclobenzaprine (FLEXERIL) 10 MG tablet Take 1 tablet (10 mg total) by mouth at bedtime. 05/22/15  Yes [name], MD    gabapentin (NEURONTIN) 100 MG capsule Take 2 capsules (200 mg total) by mouth at bedtime. Patient taking differently: Take 200 mg by mouth every 4 (four) hours.  09/28/16  Yes Maxie Barb, MD  HYDROcodone-acetaminophen (NORCO/VICODIN) 5-325 MG tablet Take 1-2 tablets by mouth every 4 (four) hours as needed for moderate pain. 08/17/16  Yes [name], MD  HYDROmorphone (DILAUDID) 2 MG tablet Take 2 mg by mouth 3 (three) times daily.   Yes [provider]  hydrOXYzine (ATARAX/VISTARIL) 25 MG tablet Take 25 mg by mouth 2 (two) times daily as needed for anxiety.    Yes [provider]  insulin glargine (LANTUS) 100 UNIT/ML injection Inject 0.4 mLs (40 Units total) into the skin 2 (two) times daily. Patient taking differently: Inject 30 Units into the skin daily.  05/20/16  Yes Dhungel, Nishant, MD  insulin lispro (HUMALOG) 100 UNIT/ML injection Inject 0.15-0.3 mLs (15-30 Units total) into the skin every evening. Take one unit per 5 carbs depending on dinner. 11/29/15  Yes Carlus Pavlov, MD  levothyroxine (SYNTHROID, LEVOTHROID) 75 MCG tablet Take 1 tablet (75 mcg total) by mouth daily before breakfast. 09/29/16  Yes Maxie Barb, MD  metoCLOPramide (REGLAN) 5 MG tablet Take 1 tablet (5 mg total) by mouth every 8 (eight) hours as needed for nausea or vomiting. Patient taking differently: Take 10 mg by mouth 3 (three) times daily.  01/10/16  Yes Dong, Robin, DO  metoprolol tartrate (LOPRESSOR) 50 MG tablet Take 2 tablets (100 mg total) by mouth 2 (two) times daily. Patient taking differently: Take 50-100 mg by mouth See admin instructions. Mon. Wed. Fri.  take 50 mg in the morning In the evening he takes 100 mt 09/06/16  Yes Crenshaw, Madolyn Frieze, MD  pantoprazole (PROTONIX) 40 MG tablet Take 1 tablet (40 mg total) by mouth daily. Patient not taking: Reported on 10/13/2016 05/25/16 09/26/17  Rolly Salter, MD    Family History Family History  Problem Relation Age of  Onset  . Obesity Mother        Patient states that family members have no other medical illnesses other than what I have described  . Kidney cancer Maternal Grandmother   . Cancer Father 50       AML  . Heart disease Unknown        No family history    Social History Social History  Substance Use Topics  . Smoking status: Never Smoker  . Smokeless tobacco: Never Used  . Alcohol use 0.0 oz/week     Comment: 06/29/2016 "might  have a few drinks/year"     Allergies   Ibuprofen; Nsaids; and Tape   Review of Systems Review of Systems  Constitutional: Negative for chills and fever.  Respiratory: Positive for chest tightness and shortness of breath. Negative for cough.   Cardiovascular: Positive for chest pain and leg swelling. Negative for palpitations.  Gastrointestinal: Negative for abdominal distention, abdominal pain, diarrhea, nausea and vomiting.  Genitourinary: Negative for dysuria, frequency, hematuria and urgency.  Musculoskeletal: Negative for arthralgias, myalgias, neck pain and neck stiffness.  Skin: Negative for rash.  Allergic/Immunologic: Negative for immunocompromised state.  Neurological: Negative for dizziness, weakness, light-headedness, numbness and headaches.  All other systems reviewed and are negative.    Physical Exam Updated Vital Signs BP (!) 205/101   Pulse (!) 102   Temp 98.5 F (36.9 C) (Oral)   Resp 17   SpO2 98%   Physical Exam  Constitutional: He appears well-developed and well-nourished. No distress.  HENT:  Head: Normocephalic and atraumatic.  Eyes: Conjunctivae are normal.  Neck: Neck supple.  Cardiovascular: Regular rhythm and normal heart sounds.   tachycardic  Pulmonary/Chest: He has wheezes. He has no rales.  Tachypneic. Inspiratory and expiratory wheezing. Decreased air movement bilaterally  Abdominal: Soft. Bowel sounds are normal. He exhibits no distension. There is no tenderness. There is no rebound.  Musculoskeletal: He  exhibits edema.  2+ LE edema bilaterally  Neurological: He is alert.  Skin: Skin is warm and dry.  Nursing note and vitals reviewed.    ED Treatments / Results  Labs (all labs ordered are listed, but only abnormal results are displayed) Labs Reviewed  BASIC METABOLIC PANEL - Abnormal; Notable for the following:       Result Value   Chloride 93 (*)    Glucose, Bld 343 (*)    BUN 25 (*)    Creatinine, Ser 5.47 (*)    GFR calc non Af Amer 11 (*)    GFR calc Af Amer 13 (*)    All other components within normal limits  CBC - Abnormal; Notable for the following:    RBC 4.02 (*)    Hemoglobin 11.3 (*)    HCT 35.2 (*)    RDW 15.9 (*)    All other components within normal limits  I-STAT TROPONIN, ED    EKG  EKG Interpretation None       Radiology Dg Chest 2 View  Result Date: 10/13/2016 CLINICAL DATA:  Mid chest pain and shortness of breath. EXAM: CHEST  2 VIEW COMPARISON:  09/26/2016 FINDINGS: Stable postsurgical changes from CABG. Moderately enlarged cardiac silhouette. Mediastinal contours appear intact. There is no evidence of focal airspace consolidation, pleural effusion or pneumothorax. Osseous structures are without acute abnormality. Soft tissues are grossly normal. IMPRESSION: Moderately enlarged cardiac silhouette. No evidence of pulmonary consolidation. Electronically Signed   By: Ted Mcalpine M.D.   On: 10/13/2016 17:59    Procedures Procedures (including critical care time)  Medications Ordered in ED Medications  ipratropium-albuterol (DUONEB) 0.5-2.5 (3) MG/3ML nebulizer solution 3 mL (3 mLs Nebulization Given 10/13/16 2018)     Initial Impression / Assessment and Plan / ED Course  I have reviewed the triage vital signs and the nursing notes.  Pertinent labs & imaging results that were available during my care of the patient were reviewed by me and considered in my medical decision making (see chart for details).     Patient with shortness of  breath since yesterday, finished dialysis but believes he did  not take enough fluid off of him. He is worried that he may not be able to make it through the weekend without dialysis given severe shortness of breath. On exam patient does have diffuse wheezing and decreased air movement. Question whether his shortness of breath could be due to asthma. Initial troponin is negative. Will reassess after breathing treatment.  Not much improvement after the breathing treatment. We'll try another one. He continues to be tachycardic and hypertensive. He has not had his metoprolol. He is now vomiting. Requesting Reglan for his nausea and vomiting, which she normally takes at home. We'll give him a dose here. Will also give him his regular dose of metoprolol and a dose of hydralazine. His oxygen is remaining normal, 96 percent on room air. Chest x-ray showing no pulmonary edema. Given hypertension and possibly hypertensive urgency in setting of chest pain and SOB.   Spoke with [facility], will admit. Asked to call nephrology  Spoke with Dr. Hyman Hopes, will consult tomorrow.   Vitals:   10/13/16 2345 10/14/16 0000 10/14/16 0015 10/14/16 0030  BP: (!) 158/76 (!) 161/71 (!) 165/98 (!) 149/97  Pulse: 100 97 99 86  Resp:    16  Temp:      TempSrc:      SpO2: 95% 95% 98% 99%     Final Clinical Impressions(s) / ED Diagnoses   Final diagnoses:  Atypical chest pain  Shortness of breath  Hypertensive urgency    New Prescriptions New Prescriptions   No medications on file     Iona Coach 10/14/16 Celene Kras, MD 10/14/16 [phone]

## 2016-10-13 NOTE — H&P (Signed)
Russell [name]:[id number] DOB: May 27, 1970 DOA: 10/13/2016     PCP: Clelia Croft Outpatient Specialists: Nephrology [facility] [place] Patient coming from:  home Lives  With family   Chief Complaint: Chest pain and SOB   HPI: Russell [name] is a 46 y.o. male with medical history significant ofinsulin-dependent diabetes, coronary artery disease status post CABG, hypertension, hypothyroidism, ESRD on hemodialysis M,W,F    Presented with Chest pain and dyspnea. The dyspnea has been since yesterday  As well as chest pain that has started last night feels like stabbing lasts 45 sec.its happening every few minutes. Reports having pain like this in the past usually takes dilaudid for this at home states he only had 2 pills he wanted to save them so he did take them. Lowering BP seemed to help with chest pain a bit.  He have   HD today but thinks that not all the fluid was taken out.  Reportedly contacted his nephrologist who told him to come to ER to get HD.  Reports wheezing chest tightness that is persistent. Nothing seems to make it better. Reports not taking his BP meds today, BP in ER SBP >200     Regarding pertinent Chronic problems:  Hx of DVT  Over 1 year he finished xarelto. Known hx of CAD Last time had to be admitted for HD in end of September  For hyperkalemia Have been on HD for 2 years still makes about 1 cup of urine.  IN ER:  Temp (24hrs), Avg:98.5 F (36.9 C), Min:98.5 F (36.9 C), Max:98.5 F (36.9 C)      on arrival  ED Triage Vitals  Enc Vitals Group     BP 10/13/16 1651 (!) 163/92     Pulse Rate 10/13/16 1651 (!) 107     Resp 10/13/16 1651 (!) 22     Temp 10/13/16 1651 98.5 F (36.9 C)     Temp Source 10/13/16 1651 Oral     SpO2 10/13/16 1651 99 %     Weight --      Height --      Head Circumference --      Peak Flow --      Pain Score 10/13/16 1649 7     Pain Loc --      Pain Edu? --      Excl. in GC? --     Latest RR 19 95% HR 133 BP  168/80 from 229/109 Trop 0.02 Na 136 K 4.1 BUN 25 Cr 5.47 WBC 7.5 Hg 11. plt 260 CXR - cardiomegaly no pulmonary consolidation  Following Medications were ordered in ER: Medications  ipratropium-albuterol (DUONEB) 0.5-2.5 (3) MG/3ML nebulizer solution 3 mL (3 mLs Nebulization Given 10/13/16 2018)  ondansetron (ZOFRAN) injection 4 mg (4 mg Intravenous Given 10/13/16 2118)  albuterol (PROVENTIL) (2.5 MG/3ML) 0.083% nebulizer solution 2.5 mg (2.5 mg Nebulization Given 10/13/16 2149)  hydrALAZINE (APRESOLINE) injection 5 mg (5 mg Intravenous Given 10/13/16 2142)  metoprolol tartrate (LOPRESSOR) tablet 50 mg (50 mg Oral Given 10/13/16 2147)  metoCLOPramide (REGLAN) injection 10 mg (10 mg Intravenous Given 10/13/16 2142)     ER provider discussed case with:  Nephrology Who recommends: admit over [facility] see patient in consult in the morning for possible HD   Hospitalist was called for admission for Hypertensive Urgency  Review of Systems:    Pertinent positives include chest pain  shortness of breath at rest.dyspnea on exertion,  Constitutional:  No weight loss, night sweats, Fevers, chills,  fatigue, weight loss  HEENT:  No headaches, Difficulty swallowing,Tooth/dental problems,Sore throat,  No sneezing, itching, ear ache, nasal congestion, post nasal drip,  Cardio-vascular:  No chest pain, Orthopnea, PND, anasarca, dizziness, palpitations.no Bilateral lower extremity swelling  GI:  No heartburn, indigestion, abdominal pain, nausea, vomiting, diarrhea, change in bowel habits, loss of appetite, melena, blood in stool, hematemesis Resp:    No excess mucus, no productive cough, No non-productive cough, No coughing up of blood.No change in color of mucus.No wheezing. Skin:  no rash or lesions. No jaundice GU:  no dysuria, change in color of urine, no urgency or frequency. No straining to urinate.  No flank pain.  Musculoskeletal:  No joint pain or no joint swelling. No decreased range  of motion. No back pain.  Psych:  No change in mood or affect. No depression or anxiety. No memory loss.  Neuro: no localizing neurological complaints, no tingling, no weakness, no double vision, no gait abnormality, no slurred speech, no confusion  As per HPI otherwise 10 point review of systems negative.   Past Medical History: Past Medical History:  Diagnosis Date  . Anxiety   . Asthma   . 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 Daily headache   . Depression   . DVT (deep venous thrombosis), H/o 01/2014-on Xarelto 03/24/2014   LLE  . ESRD (end stage renal disease) on dialysis [facility])    "[place], MWF" (06/29/2016)  . Hypertension   . Hypothyroidism   . Nephrotic syndrome 05/18/2014  . Type 2 diabetes mellitus with diabetic nephropathy [facility])    Past Surgical History:  Procedure Laterality Date  . ANKLE FRACTURE SURGERY Right 1988  . AV FISTULA PLACEMENT Left 01/01/2015   Procedure: CREATION OF LEFT RADIAL CEPHALIC ARTERIOVENOUS (AV) FISTULA ;  Surgeon: Pryor Ochoa, MD;  Location: [facility] OR;  Service: Vascular;  Laterality: Left;  . CAPD REMOVAL N/A 05/19/2016   Procedure: PD CATH REMOVAL;  Surgeon: Abigail Miyamoto, MD;  Location: [facility] OR;  Service: General;  Laterality: N/A;  . CARDIAC CATHETERIZATION N/A 09/22/2015   Procedure: Left Heart Cath and Coronary Angiography;  Surgeon: Iran Ouch, MD;  Location: [facility] INVASIVE CV LAB;  Service: Cardiovascular;  Laterality: N/A;  . CORONARY ARTERY BYPASS GRAFT N/A 09/28/2015   Procedure: CORONARY ARTERY BYPASS GRAFTING (CABG) x 3 UTILIZING LEFT MAMMARY ARTERY AND ENDOSCOPICALLY HARVESTED LEFT GREATER SAPHENOUS VEIN.;  Surgeon: Delight Ovens, MD;  Location: [facility] OR;  Service: Open Heart Surgery;  Laterality: N/A;  . ENDOVEIN HARVEST OF GREATER SAPHENOUS VEIN Left 09/28/2015   Procedure: ENDOVEIN  HARVEST OF GREATER SAPHENOUS VEIN;  Surgeon: Delight Ovens, MD;  Location: [facility] OR;  Service: Open Heart Surgery;  Laterality: Left;  . ESOPHAGOGASTRODUODENOSCOPY N/A 03/29/2014   Procedure: ESOPHAGOGASTRODUODENOSCOPY (EGD);  Surgeon: Dorena Cookey, MD;  Location: [facility] ENDOSCOPY;  Service: Endoscopy;  Laterality: N/A;  . EYE SURGERY    . FRACTURE SURGERY    . INSERTION OF DIALYSIS CATHETER Right 01/05/2015   Procedure: INSERTION OF RIGHT INTERNAL JUGULAR DIALYSIS CATHETER;  Surgeon: Fransisco Hertz, MD;  Location: [facility] OR;  Service: Vascular;  Laterality: Right;  . LEFT HEART CATH AND CORS/GRAFTS ANGIOGRAPHY N/A 09/13/2016   Procedure: LEFT HEART CATH AND CORS/GRAFTS ANGIOGRAPHY;  Surgeon: Kathleene Hazel, MD;  Location: [facility] INVASIVE CV LAB;  Service: Cardiovascular;  Laterality: N/A;  .  RETINAL LASER PROCEDURE Bilateral   . TEE WITHOUT CARDIOVERSION N/A 09/28/2015   Procedure: TRANSESOPHAGEAL ECHOCARDIOGRAM (TEE);  Surgeon: Delight Ovens, MD;  Location: [facility] OR;  Service: Open Heart Surgery;  Laterality: N/A;  . TONSILLECTOMY AND ADENOIDECTOMY  1970s     Social History:  Ambulatory  independently     reports that he has never smoked. He has never used smokeless tobacco. He reports that he drinks alcohol. He reports that he does not use drugs.  Allergies:   Allergies  Allergen Reactions  . Ibuprofen Other (See Comments)    MD told patient not to take due to kidney disease  . Nsaids Other (See Comments)    Told to avoid all nsaids due to kidney disease   . Tape Other (See Comments)    Welts result, if left for a long amount of time       Family History:   Family History  Problem Relation Age of Onset  . Obesity Mother        Patient states that family members have no other medical illnesses other than what I have described  . Kidney cancer Maternal Grandmother   . Cancer Father 50       AML  . Heart disease Unknown        No family history    Medications: Prior to  Admission medications   Medication Sig Start Date End Date Taking? Authorizing Provider  acetaminophen (TYLENOL) 500 MG tablet Take 1 tablet (500 mg total) by mouth every 6 (six) hours as needed for moderate pain. Patient taking differently: Take 1,000 mg by mouth every 6 (six) hours as needed (for pain).  09/29/14  Yes Albertine Grates, MD  albuterol (PROVENTIL HFA;VENTOLIN HFA) 108 (90 Base) MCG/ACT inhaler Inhale 1 puff into the lungs every 6 (six) hours as needed for wheezing or shortness of breath. Patient taking differently: Inhale 2 puffs into the lungs every 8 (eight) hours as needed for wheezing or shortness of breath.  03/11/15  Yes [name], MD  albuterol (PROVENTIL) (2.5 MG/3ML) 0.083% nebulizer solution Take 3 mLs (2.5 mg total) by nebulization every 4 (four) hours as needed for wheezing. 09/20/16  Yes [name], MD  albuterol-ipratropium (COMBIVENT) 18-103 MCG/ACT inhaler Inhale 2 puffs into the lungs every 4 (four) hours. Patient taking differently: Inhale 2 puffs into the lungs 3 (three) times daily as needed for wheezing or shortness of breath.  09/03/14  Yes [name], MD  amLODipine (NORVASC) 10 MG tablet Take 1 tablet (10 mg total) by mouth daily. DO NOT TAKE ON DIALYSIS DAYS 08/17/16  Yes Lewayne Bunting, MD  aspirin EC 81 MG EC tablet Take 1 tablet (81 mg total) by mouth daily. 10/06/15  Yes Barrett, Erin R, PA-C  atorvastatin (LIPITOR) 80 MG tablet Take 1 tablet (80 mg total) by mouth daily at 6 PM. 04/17/16  Yes Shade Flood, MD  calcitRIOL (ROCALTROL) 0.25 MCG capsule Take 1 capsule (0.25 mcg total) by mouth every Monday, Wednesday, and Friday with hemodialysis. Patient taking differently: Take 0.25 mcg by mouth See admin instructions. Tuesday Thursday Saturday 10/06/15  Yes Barrett, Erin R, PA-C  calcium acetate (PHOSLO) 667 MG capsule Take 2 capsules (1,334 mg total) by mouth 3 (three) times daily with meals. Patient taking differently: Take 1,334-2,001 mg by mouth See  admin instructions. Take 2001 mg by mouth 3 times daily with meals and take 1334 mg by mouth with snacks 01/08/15  Yes Rolly Salter, MD  cinacalcet (SENSIPAR) 30 MG tablet Take 30 mg by mouth every Monday, Wednesday, and Friday.   Yes [provider]  citalopram (CELEXA) 40 MG tablet Take 40 mg by mouth at bedtime.    Yes [provider]  clonazePAM (KLONOPIN) 2 MG tablet Take 2 mg by mouth daily.   Yes [provider]  clopidogrel (PLAVIX) 75 MG tablet Take 1 tablet (75 mg total) by mouth daily. Patient taking differently: Take 75 mg by mouth at bedtime.  01/27/16  Yes Lewayne Bunting, MD  cyclobenzaprine (FLEXERIL) 10 MG tablet Take 1 tablet (10 mg total) by mouth at bedtime. 05/22/15  Yes [name], MD  gabapentin (NEURONTIN) 100 MG capsule Take 2 capsules (200 mg total) by mouth at bedtime. Patient taking differently: Take 200 mg by mouth every 4 (four) hours.  09/28/16  Yes Maxie Barb, MD  HYDROcodone-acetaminophen (NORCO/VICODIN) 5-325 MG tablet Take 1-2 tablets by mouth every 4 (four) hours as needed for moderate pain. 08/17/16  Yes [name], MD  HYDROmorphone (DILAUDID) 2 MG tablet Take 2 mg by mouth 3 (three) times daily.   Yes [provider]  hydrOXYzine (ATARAX/VISTARIL) 25 MG tablet Take 25 mg by mouth 2 (two) times daily as needed for anxiety.    Yes [provider]  insulin glargine (LANTUS) 100 UNIT/ML injection Inject 0.4 mLs (40 Units total) into the skin 2 (two) times daily. Patient taking differently: Inject 30 Units into the skin daily.  05/20/16  Yes Dhungel, Nishant, MD  insulin lispro (HUMALOG) 100 UNIT/ML injection Inject 0.15-0.3 mLs (15-30 Units total) into the skin every evening. Take one unit per 5 carbs depending on dinner. 11/29/15  Yes Carlus Pavlov, MD  levothyroxine (SYNTHROID, LEVOTHROID) 75 MCG tablet Take 1 tablet (75 mcg total) by mouth daily before breakfast. 09/29/16  Yes Maxie Barb,  MD  metoCLOPramide (REGLAN) 5 MG tablet Take 1 tablet (5 mg total) by mouth every 8 (eight) hours as needed for nausea or vomiting. Patient taking differently: Take 10 mg by mouth 3 (three) times daily.  01/10/16  Yes Dong, Robin, DO  metoprolol tartrate (LOPRESSOR) 50 MG tablet Take 2 tablets (100 mg total) by mouth 2 (two) times daily. Patient taking differently: Take 50-100 mg by mouth See admin instructions. Mon. Wed. Fri.  take 50 mg in the morning In the evening he takes 100 mt 09/06/16  Yes [facility], Madolyn Frieze, MD  pantoprazole (PROTONIX) 40 MG tablet Take 1 tablet (40 mg total) by mouth daily. Patient not taking: Reported on 10/13/2016 05/25/16 09/26/17  Rolly Salter, MD    Physical Exam: Patient Vitals for the past 24 hrs:  BP Temp Temp src Pulse Resp SpO2  10/13/16 2142 (!) 229/109 - - - - -  10/13/16 2130 (!) 229/109 - - (!) 113 17 100 %  10/13/16 2115 (!) 226/106 - - (!) 116 17 94 %  10/13/16 2100 (!) 221/128 - - (!) 112 (!) 23 94 %  10/13/16 2045 (!) 259/110 - - (!) 112 (!) 25 96 %  10/13/16 2030 (!) 235/120 - - (!) 102 (!) 22 100 %  10/13/16 2000 (!) 205/101 - - (!) 102 17 98 %  10/13/16 1945 (!) 201/98 - - 94 (!) 22 97 %  10/13/16 1651 (!) 163/92 98.5 F (36.9 C) Oral (!) 107 (!) 22 99 %    1. General:  in No Acute distress   Chronically ill -appearing 2. Psychological: Alert and   Oriented  3. Head/ENT:   Mois  Mucous Membranes                          Head Non traumatic, neck supple                            Poor Dentition 4. SKIN: normal  Skin turgor,  Skin clean Dry and intact no rash 5. Heart: Regular rate and rhythm no  Murmur, no Rub or gallop 6. Lungs: Clear to auscultation bilaterally, no wheezes or crackles   7. Abdomen: Soft,  non-tender, Non distended   obese  bowel sounds present 8. Lower extremities: no clubbing, cyanosis, or edema 9. Neurologically Grossly intact, moving all 4 extremities equally   10. MSK: Normal range of motion   body mass index is  unknown because there is no height or weight on file.  Labs on Admission:   Labs on Admission: I have personally reviewed following labs and imaging studies  CBC:  Recent Labs Lab 10/13/16 1649  WBC 7.5  HGB 11.3*  HCT 35.2*  MCV 87.6  PLT 260   Basic Metabolic Panel:  Recent Labs Lab 10/13/16 1649  NA 136  K 4.1  CL 93*  CO2 31  GLUCOSE 343*  BUN 25*  CREATININE 5.47*  CALCIUM 9.2   GFR: CrCl cannot be calculated (Unknown ideal weight.). Liver Function Tests: No results for input(s): AST, ALT, ALKPHOS, BILITOT, PROT, ALBUMIN in the last 168 hours. No results for input(s): LIPASE, AMYLASE in the last 168 hours. No results for input(s): AMMONIA in the last 168 hours. Coagulation Profile: No results for input(s): INR, PROTIME in the last 168 hours. Cardiac Enzymes: No results for input(s): CKTOTAL, CKMB, CKMBINDEX, TROPONINI in the last 168 hours. BNP (last 3 results) No results for input(s): PROBNP in the last 8760 hours. HbA1C: No results for input(s): HGBA1C in the last 72 hours. CBG: No results for input(s): GLUCAP in the last 168 hours. Lipid Profile: No results for input(s): CHOL, HDL, LDLCALC, TRIG, CHOLHDL, LDLDIRECT in the last 72 hours. Thyroid Function Tests: No results for input(s): TSH, T4TOTAL, FREET4, T3FREE, THYROIDAB in the last 72 hours. Anemia Panel: No results for input(s): VITAMINB12, FOLATE, FERRITIN, TIBC, IRON, RETICCTPCT in the last 72 hours. Urine analysis:    Component Value Date/Time   COLORURINE YELLOW 05/18/2016 2110   APPEARANCEUR HAZY (A) 05/18/2016 2110   LABSPEC 1.018 05/18/2016 2110   PHURINE 7.0 05/18/2016 2110   GLUCOSEU >=500 (A) 05/18/2016 2110   HGBUR NEGATIVE 05/18/2016 2110   BILIRUBINUR NEGATIVE 05/18/2016 2110   BILIRUBINUR neg 05/26/2014 1956   KETONESUR NEGATIVE 05/18/2016 2110   PROTEINUR >=300 (A) 05/18/2016 2110   UROBILINOGEN 0.2 09/30/2014 1730   NITRITE NEGATIVE 05/18/2016 2110   LEUKOCYTESUR  NEGATIVE 05/18/2016 2110   Sepsis Labs: (procalcitonin:4,lacticidven:4) )No results found for this or any previous visit (from the past 240 hour(s)).     UA  ordered  Lab Results  Component Value Date   HGBA1C 12.8 (H) 05/17/2016    CrCl cannot be calculated (Unknown ideal weight.).  BNP (last 3 results) No results for input(s): PROBNP in the last 8760 hours.   ECG REPORT  Independently reviewed Rate: 110  Rhythm: sinus tachycardia ST&T Change: No acute ischemic changes   QTC 481  There were no vitals filed for this visit.   Cultures:    Component Value Date/Time   SDES  BLOOD RIGHT HAND 05/16/2016 2032   SPECREQUEST  05/16/2016 2032    BOTTLES DRAWN AEROBIC AND ANAEROBIC Blood Culture adequate volume   CULT NO GROWTH 5 DAYS 05/16/2016 2032   REPTSTATUS 05/21/2016 FINAL 05/16/2016 2032     Radiological Exams on Admission: Dg Chest 2 View  Result Date: 10/13/2016 CLINICAL DATA:  Mid chest pain and shortness of breath. EXAM: CHEST  2 VIEW COMPARISON:  09/26/2016 FINDINGS: Stable postsurgical changes from CABG. Moderately enlarged cardiac silhouette. Mediastinal contours appear intact. There is no evidence of focal airspace consolidation, pleural effusion or pneumothorax. Osseous structures are without acute abnormality. Soft tissues are grossly normal. IMPRESSION: Moderately enlarged cardiac silhouette. No evidence of pulmonary consolidation. Electronically Signed   By: Ted Mcalpine M.D.   On: 10/13/2016 17:59    Chart has been reviewed    Assessment/Plan  46 y.o. 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 Chest pain - somewhat atypical the patient states he had had similar presentation in the past that have required CABG. Cycle cardiac enzymes  and recurrent chest pain admit to step down. Not truly pleuritic troponin negative no evidence of hypotension or hypoxia  check d-dimer is positive for obtained CT chest to evaluate for PE. Patient had been on hemodialysis for at least 2 years . Coronary artery disease involving native coronary artery of native heart without angina pectoris - continue Plavix aspirin statin and beta blocker appreciate cardiology input given recurrent chest pain . Uncontrolled diabetes mellitus type 2 with peripheral artery disease (HCC) -  - Order Sensitive SSI   - continue home insulin regimen   -  check TSH and HgA1C  - Hold by mouth medications    . Essential hypertension - continue metoprolol given sufficient drop in blood pressure will not resume the Lovenox tonight to avoid over correction patient will likely have hemodialysis tomorrow . Hypothyroidism (acquired)Continue Synthroid check TSH . Hypertensive urgency Currently improving after restarting her medications continue to cycle cardiac enzymes . Anemia due to end stage renal disease (HCC)  Right leg swelling unsure duration patient have had history of DVT in the past with obtain Dopplers  Chronic pain resume home medications Asthma continue home medications currently no evidence of wheezing states that his symptoms does not go similar to prior asthma attacks Other plan as per orders.  DVT prophylaxis:    Lovenox     Code Status:  FULL CODE  care as per patient    Family Communication:   Family   at  Bedside  plan of care was discussed with  mother  Disposition Plan:     To home once workup is complete and patient is stable                                  Consults called: nephrology, emailed cardiology  Admission status:   inpatient    Level of care   SDU      I have spent a total of 57 min on this admission    Clarann Helvey 10/13/2016, 11:28 PM    [facility]  Pager [phone]   after 2 AM please page floor coverage  PA If 7AM-7PM, please contact the day team taking care of the patient  [email or web address]  Password [email or web address]

## 2016-10-14 ENCOUNTER — Inpatient Hospital Stay (HOSPITAL_COMMUNITY): Payer: Medicare Other

## 2016-10-14 ENCOUNTER — Encounter (HOSPITAL_COMMUNITY): Payer: Self-pay | Admitting: Emergency Medicine

## 2016-10-14 DIAGNOSIS — R079 Chest pain, unspecified: Secondary | ICD-10-CM

## 2016-10-14 DIAGNOSIS — R0602 Shortness of breath: Secondary | ICD-10-CM

## 2016-10-14 LAB — ECHOCARDIOGRAM COMPLETE
EERAT: 10.1
EWDT: 153 ms
FS: 26 % — AB (ref 28–44)
HEIGHTINCHES: 74 in
IV/PV OW: 0.82
LA ID, A-P, ES: 46 mm
LA diam end sys: 46 mm
LA vol A4C: 67.5 ml
LA vol index: 25.2 mL/m2
LA vol: 61 mL
LADIAMINDEX: 1.9 cm/m2
LV TDI E'LATERAL: 10
LVEEAVG: 10.1
LVEEMED: 10.1
LVELAT: 10 cm/s
LVOT VTI: 20 cm
LVOT area: 4.52 cm2
LVOT diameter: 24 mm
LVOT peak grad rest: 6 mmHg
LVOTPV: 122 cm/s
LVOTSV: 90 mL
MV Dec: 153
MV pk A vel: 104 m/s
MVPG: 4 mmHg
MVPKEVEL: 101 m/s
PW: 13.7 mm — AB (ref 0.6–1.1)
TDI e' medial: 6.96
WEIGHTICAEL: 4134.07 [oz_av]

## 2016-10-14 LAB — COMPREHENSIVE METABOLIC PANEL
ALK PHOS: 81 U/L (ref 38–126)
ALT: 17 U/L (ref 17–63)
ANION GAP: 14 (ref 5–15)
AST: 21 U/L (ref 15–41)
Albumin: 3.3 g/dL — ABNORMAL LOW (ref 3.5–5.0)
BUN: 33 mg/dL — ABNORMAL HIGH (ref 6–20)
CALCIUM: 9.1 mg/dL (ref 8.9–10.3)
CHLORIDE: 93 mmol/L — AB (ref 101–111)
CO2: 28 mmol/L (ref 22–32)
Creatinine, Ser: 6.73 mg/dL — ABNORMAL HIGH (ref 0.61–1.24)
GFR, EST AFRICAN AMERICAN: 10 mL/min — AB (ref >60)
GFR, EST NON AFRICAN AMERICAN: 9 mL/min — AB (ref >60)
Glucose, Bld: 329 mg/dL — ABNORMAL HIGH (ref 65–99)
Potassium: 4.1 mmol/L (ref 3.5–5.1)
SODIUM: 135 mmol/L (ref 135–145)
Total Bilirubin: 0.7 mg/dL (ref 0.3–1.2)
Total Protein: 6.5 g/dL (ref 6.5–8.1)

## 2016-10-14 LAB — RAPID URINE DRUG SCREEN, HOSP PERFORMED
AMPHETAMINES: NOT DETECTED
BARBITURATES: NOT DETECTED
BENZODIAZEPINES: NOT DETECTED
Cocaine: NOT DETECTED
Opiates: POSITIVE — AB
TETRAHYDROCANNABINOL: NOT DETECTED

## 2016-10-14 LAB — PHOSPHORUS: Phosphorus: 6.5 mg/dL — ABNORMAL HIGH (ref 2.5–4.6)

## 2016-10-14 LAB — MAGNESIUM: MAGNESIUM: 1.9 mg/dL (ref 1.7–2.4)

## 2016-10-14 LAB — CBC
HCT: 31.6 % — ABNORMAL LOW (ref 39.0–52.0)
HEMOGLOBIN: 10 g/dL — AB (ref 13.0–17.0)
MCH: 27.9 pg (ref 26.0–34.0)
MCHC: 31.6 g/dL (ref 30.0–36.0)
MCV: 88 fL (ref 78.0–100.0)
Platelets: 234 10*3/uL (ref 150–400)
RBC: 3.59 MIL/uL — AB (ref 4.22–5.81)
RDW: 16.5 % — ABNORMAL HIGH (ref 11.5–15.5)
WBC: 7.4 10*3/uL (ref 4.0–10.5)

## 2016-10-14 LAB — GLUCOSE, CAPILLARY
GLUCOSE-CAPILLARY: 291 mg/dL — AB (ref 65–99)
GLUCOSE-CAPILLARY: 190 mg/dL — AB (ref 65–99)
Glucose-Capillary: 371 mg/dL — ABNORMAL HIGH (ref 65–99)

## 2016-10-14 LAB — HEMOGLOBIN A1C
Hgb A1c MFr Bld: 10.8 % — ABNORMAL HIGH (ref 4.8–5.6)
Mean Plasma Glucose: 263.26 mg/dL

## 2016-10-14 LAB — TSH: TSH: 6.256 u[IU]/mL — AB (ref 0.350–4.500)

## 2016-10-14 LAB — TROPONIN I
TROPONIN I: 0.06 ng/mL — AB (ref <0.03)
Troponin I: 0.06 ng/mL (ref <0.03)

## 2016-10-14 LAB — CBG MONITORING, ED: GLUCOSE-CAPILLARY: 375 mg/dL — AB (ref 65–99)

## 2016-10-14 MED ORDER — ENOXAPARIN SODIUM 30 MG/0.3ML ~~LOC~~ SOLN
30.0000 mg | Freq: Every day | SUBCUTANEOUS | Status: DC
  Administered 2016-10-15: 30 mg via SUBCUTANEOUS
  Filled 2016-10-14 (×2): qty 0.3

## 2016-10-14 MED ORDER — LEVOTHYROXINE SODIUM 75 MCG PO TABS
75.0000 ug | ORAL_TABLET | Freq: Every day | ORAL | Status: DC
  Administered 2016-10-15: 75 ug via ORAL
  Filled 2016-10-14: qty 1

## 2016-10-14 MED ORDER — METOPROLOL TARTRATE 50 MG PO TABS: 50.0000 mg | ORAL_TABLET | ORAL | Status: DC

## 2016-10-14 MED ORDER — AMLODIPINE BESYLATE 10 MG PO TABS
10.0000 mg | ORAL_TABLET | Freq: Every day | ORAL | Status: DC
  Administered 2016-10-14: 10 mg via ORAL
  Filled 2016-10-14: qty 1

## 2016-10-14 MED ORDER — INSULIN ASPART 100 UNIT/ML ~~LOC~~ SOLN
0.0000 [IU] | Freq: Three times a day (TID) | SUBCUTANEOUS | Status: DC
  Administered 2016-10-14 (×2): 5 [IU] via SUBCUTANEOUS
  Administered 2016-10-14: 2 [IU] via SUBCUTANEOUS
  Administered 2016-10-15: 5 [IU] via SUBCUTANEOUS
  Administered 2016-10-15: 3 [IU] via SUBCUTANEOUS

## 2016-10-14 MED ORDER — SODIUM CHLORIDE 0.9 % IV SOLN: 100.0000 mL | INTRAVENOUS | Status: DC | PRN

## 2016-10-14 MED ORDER — SODIUM CHLORIDE 0.9% FLUSH
3.0000 mL | Freq: Two times a day (BID) | INTRAVENOUS | Status: DC
  Administered 2016-10-14 – 2016-10-15 (×4): 3 mL via INTRAVENOUS

## 2016-10-14 MED ORDER — IOPAMIDOL (ISOVUE-370) INJECTION 76%
INTRAVENOUS | Status: AC
  Administered 2016-10-14: 100 mL
  Filled 2016-10-14: qty 100

## 2016-10-14 MED ORDER — CALCIUM ACETATE (PHOS BINDER) 667 MG PO CAPS
2001.0000 mg | ORAL_CAPSULE | Freq: Three times a day (TID) | ORAL | Status: DC
  Administered 2016-10-14 – 2016-10-15 (×3): 2001 mg via ORAL
  Filled 2016-10-14 (×3): qty 3

## 2016-10-14 MED ORDER — LIDOCAINE-PRILOCAINE 2.5-2.5 % EX CREA: 1.0000 "application " | TOPICAL_CREAM | CUTANEOUS | Status: DC | PRN

## 2016-10-14 MED ORDER — MORPHINE SULFATE (PF) 4 MG/ML IV SOLN: 2.0000 mg | INTRAVENOUS | Status: DC | PRN

## 2016-10-14 MED ORDER — ONDANSETRON HCL 4 MG/2ML IJ SOLN
4.0000 mg | Freq: Four times a day (QID) | INTRAMUSCULAR | Status: DC | PRN
  Administered 2016-10-14: 4 mg via INTRAVENOUS
  Filled 2016-10-14: qty 2

## 2016-10-14 MED ORDER — HYDROMORPHONE HCL 2 MG PO TABS
2.0000 mg | ORAL_TABLET | Freq: Three times a day (TID) | ORAL | Status: DC
  Administered 2016-10-14 – 2016-10-15 (×4): 2 mg via ORAL
  Filled 2016-10-14 (×4): qty 1

## 2016-10-14 MED ORDER — GABAPENTIN 100 MG PO CAPS
200.0000 mg | ORAL_CAPSULE | ORAL | Status: DC
  Administered 2016-10-14 – 2016-10-15 (×8): 200 mg via ORAL
  Filled 2016-10-14 (×8): qty 2

## 2016-10-14 MED ORDER — ALTEPLASE 2 MG IJ SOLR: 2.0000 mg | Freq: Once | INTRAMUSCULAR | Status: DC | PRN

## 2016-10-14 MED ORDER — SUCRALFATE 1 GM/10ML PO SUSP
1.0000 g | Freq: Three times a day (TID) | ORAL | Status: DC
  Filled 2016-10-14 (×3): qty 10

## 2016-10-14 MED ORDER — METOPROLOL TARTRATE 100 MG PO TABS
100.0000 mg | ORAL_TABLET | Freq: Every day | ORAL | Status: DC
  Administered 2016-10-14: 100 mg via ORAL
  Filled 2016-10-14 (×2): qty 1

## 2016-10-14 MED ORDER — PERFLUTREN LIPID MICROSPHERE
1.0000 mL | INTRAVENOUS | Status: AC | PRN
  Administered 2016-10-14: 3 mL via INTRAVENOUS
  Filled 2016-10-14: qty 10

## 2016-10-14 MED ORDER — CLONAZEPAM 0.5 MG PO TABS
2.0000 mg | ORAL_TABLET | Freq: Every day | ORAL | Status: DC
  Administered 2016-10-14: 2 mg via ORAL
  Filled 2016-10-14: qty 4

## 2016-10-14 MED ORDER — ACETAMINOPHEN 650 MG RE SUPP
650.0000 mg | Freq: Four times a day (QID) | RECTAL | Status: DC | PRN
Start: 2016-10-14 — End: 2016-10-15

## 2016-10-14 MED ORDER — PENTAFLUOROPROP-TETRAFLUOROETH EX AERO: 1.0000 "application " | INHALATION_SPRAY | CUTANEOUS | Status: DC | PRN

## 2016-10-14 MED ORDER — LIDOCAINE HCL (PF) 1 % IJ SOLN: 5.0000 mL | INTRAMUSCULAR | Status: DC | PRN

## 2016-10-14 MED ORDER — METOPROLOL TARTRATE 50 MG PO TABS
50.0000 mg | ORAL_TABLET | Freq: Two times a day (BID) | ORAL | Status: DC
Start: 2016-10-14 — End: 2016-10-14

## 2016-10-14 MED ORDER — ONDANSETRON HCL 4 MG PO TABS: 4.0000 mg | ORAL_TABLET | Freq: Four times a day (QID) | ORAL | Status: DC | PRN

## 2016-10-14 MED ORDER — CLOPIDOGREL BISULFATE 75 MG PO TABS
75.0000 mg | ORAL_TABLET | Freq: Every day | ORAL | Status: DC
  Administered 2016-10-14: 75 mg via ORAL
  Filled 2016-10-14 (×2): qty 1

## 2016-10-14 MED ORDER — ACETAMINOPHEN 325 MG PO TABS
650.0000 mg | ORAL_TABLET | Freq: Four times a day (QID) | ORAL | Status: DC | PRN
  Administered 2016-10-14 – 2016-10-15 (×2): 650 mg via ORAL
  Filled 2016-10-14 (×2): qty 2

## 2016-10-14 MED ORDER — LABETALOL HCL 5 MG/ML IV SOLN
10.0000 mg | INTRAVENOUS | Status: DC | PRN
  Administered 2016-10-14 (×3): 10 mg via INTRAVENOUS
  Filled 2016-10-14 (×2): qty 4

## 2016-10-14 MED ORDER — CALCIUM ACETATE (PHOS BINDER) 667 MG PO CAPS: 1334.0000 mg | ORAL_CAPSULE | Freq: Two times a day (BID) | ORAL | Status: DC | PRN

## 2016-10-14 MED ORDER — HEPARIN SODIUM (PORCINE) 1000 UNIT/ML DIALYSIS: 1000.0000 [IU] | INTRAMUSCULAR | Status: DC | PRN

## 2016-10-14 MED ORDER — INSULIN GLARGINE 100 UNIT/ML ~~LOC~~ SOLN
30.0000 [IU] | Freq: Every day | SUBCUTANEOUS | Status: DC
  Administered 2016-10-14 – 2016-10-15 (×2): 30 [IU] via SUBCUTANEOUS
  Filled 2016-10-14 (×2): qty 0.3

## 2016-10-14 MED ORDER — METOPROLOL TARTRATE 100 MG PO TABS
100.0000 mg | ORAL_TABLET | ORAL | Status: DC
  Administered 2016-10-14: 100 mg via ORAL

## 2016-10-14 MED ORDER — CALCITRIOL 0.25 MCG PO CAPS: 0.2500 ug | ORAL_CAPSULE | ORAL | Status: DC

## 2016-10-14 MED ORDER — OXYCODONE HCL 5 MG PO TABS: 10.0000 mg | ORAL_TABLET | Freq: Once | ORAL | Status: DC

## 2016-10-14 MED ORDER — METOCLOPRAMIDE HCL 10 MG PO TABS
10.0000 mg | ORAL_TABLET | Freq: Three times a day (TID) | ORAL | Status: DC
  Administered 2016-10-14 – 2016-10-15 (×4): 10 mg via ORAL
  Filled 2016-10-14 (×4): qty 1

## 2016-10-14 MED ORDER — INSULIN ASPART 100 UNIT/ML ~~LOC~~ SOLN
0.0000 [IU] | Freq: Every day | SUBCUTANEOUS | Status: DC
  Administered 2016-10-14: 5 [IU] via SUBCUTANEOUS
  Filled 2016-10-14: qty 1

## 2016-10-14 MED ORDER — CINACALCET HCL 30 MG PO TABS: 30.0000 mg | ORAL_TABLET | ORAL | Status: DC

## 2016-10-14 MED ORDER — SODIUM CHLORIDE 0.9% FLUSH
3.0000 mL | INTRAVENOUS | Status: DC | PRN
  Administered 2016-10-14: 3 mL via INTRAVENOUS

## 2016-10-14 MED ORDER — CITALOPRAM HYDROBROMIDE 20 MG PO TABS
40.0000 mg | ORAL_TABLET | Freq: Every day | ORAL | Status: DC
  Administered 2016-10-14: 40 mg via ORAL
  Filled 2016-10-14: qty 2

## 2016-10-14 MED ORDER — OXYCODONE HCL 5 MG PO TABS
ORAL_TABLET | ORAL | Status: AC
  Filled 2016-10-14: qty 2

## 2016-10-14 MED ORDER — LEVALBUTEROL HCL 0.63 MG/3ML IN NEBU: 0.6300 mg | INHALATION_SOLUTION | Freq: Four times a day (QID) | RESPIRATORY_TRACT | Status: DC | PRN

## 2016-10-14 MED ORDER — LABETALOL HCL 5 MG/ML IV SOLN
INTRAVENOUS | Status: AC
  Administered 2016-10-14: 10 mg via INTRAVENOUS
  Filled 2016-10-14: qty 4

## 2016-10-14 MED ORDER — CALCITRIOL 0.5 MCG PO CAPS: 0.5000 ug | ORAL_CAPSULE | ORAL | Status: DC

## 2016-10-14 MED ORDER — SODIUM CHLORIDE 0.9 % IV SOLN
250.0000 mL | INTRAVENOUS | Status: DC | PRN
Start: 2016-10-14 — End: 2016-10-15

## 2016-10-14 NOTE — Progress Notes (Signed)
[facility] Progress Note  Patient: Russell [name] WUJ:[id number]   PCP: Patient, No Pcp Per DOB: 1970/04/09   DOA: 10/13/2016   DOS: 10/14/2016   Date of Service: the patient was seen and examined on 10/14/2016  Subjective: Feeling better. No chest pain no nausea no vomiting. Still has some shortness of breath. Mentions that he remains compliant with all his medications.  Brief hospital course: Pt. with PMH of IDDM, CAD S/P CABG, Htn, hypothyroidism, ESRD on HD MWF; admitted on 10/13/2016, presented with complaint of shortness of breath and chest pain, was found to have hypertensive urgency due to volume overload. Currently further plan is continue home regimen and follow up on hemodialysis.  Assessment and Plan: 1. Hypertensive emergency. Blood pressure significantly elevated on admission. With symptoms of chest pain and shortness of breath. Currently on room air. Improved with hemodialysis. Continuing home regimen. Would like to simplify patient's regimen to improve compliance at home.  2. ESRD. On hemodialysis Monday Wednesday Friday. Plan for him dialysis on Friday although not sure whether he completed his treatment. Received acute hemodialysis here in the hospital. Monitor daily weight.  3. Hypothyroidism. Continuing home regimen.  4. Uncontrolled type 2 diabetes mellitus with hyperglycemia. On sliding scale insulin.  5. Anemia of chronic disease. Continue to monitor.  Diet: Renal diet DVT Prophylaxis: subcutaneous Heparin  Advance goals of care discussion: full code  Family Communication: no family was present at bedside, at the time of interview.   Disposition:  Discharge to home.  Consultants: nephrolofy Procedures: HD Echocardiogram   Antibiotics: Anti-infectives    None       Objective: Physical Exam: Vitals:   10/14/16 1430 10/14/16 1520 10/14/16 1851 10/14/16 1924  BP: (!) 175/97 (!) 180/118 (!) 164/95 (!) 161/87  Pulse: 99 (!) 105      Resp:  18    Temp:  98.6 F (37 C)    TempSrc:  Oral    SpO2:  98%    Weight:      Height:        Intake/Output Summary (Last 24 hours) at 10/14/16 1934 Last data filed at 10/14/16 1536  Gross per 24 hour  Intake              243 ml  Output             5000 ml  Net            -4757 ml   Filed Weights   10/14/16 0546 10/14/16 0956 10/14/16 1409  Weight: 120.5 kg (265 lb 9.6 oz) 123.2 kg (271 lb 9.7 oz) 117.2 kg (258 lb 6.1 oz)   General: Alert, Awake and Oriented to Time, Place and Person. Appear in mild distress, affect appropriate Eyes: PERRL, Conjunctiva normal ENT: Oral Mucosa clear moist. Neck: difficult to assess JVD, no Abnormal Mass Or lumps Cardiovascular: S1 and S2 Present, aortic systolic Murmur, Peripheral Pulses Present Respiratory: increased respiratory effort, Bilateral Air entry equal and Decreased, no use of accessory muscle, Clear to Auscultation, no Crackles, no wheezes Abdomen: Bowel Sound present, Soft and no tenderness, no hernia Skin: no redness, no Rash, no induration Extremities: trace Pedal edema, no calf tenderness Neurologic: Grossly no focal neuro deficit. Bilaterally Equal motor strength  Data Reviewed: CBC:  Recent Labs Lab 10/13/16 1649 10/14/16 0342  WBC 7.5 7.4  HGB 11.3* 10.0*  HCT 35.2* 31.6*  MCV 87.6 88.0  PLT 260 234   Basic Metabolic Panel:  Recent Labs Lab 10/13/16 1649  10/14/16 0342  NA 136 135  K 4.1 4.1  CL 93* 93*  CO2 31 28  GLUCOSE 343* 329*  BUN 25* 33*  CREATININE 5.47* 6.73*  CALCIUM 9.2 9.1  MG  --  1.9  PHOS  --  6.5*    Liver Function Tests:  Recent Labs Lab 10/14/16 0342  AST 21  ALT 17  ALKPHOS 81  BILITOT 0.7  PROT 6.5  ALBUMIN 3.3*   No results for input(s): LIPASE, AMYLASE in the last 168 hours. No results for input(s): AMMONIA in the last 168 hours. Coagulation Profile: No results for input(s): INR, PROTIME in the last 168 hours. Cardiac Enzymes:  Recent Labs Lab 10/13/16 2203  10/14/16 0342 10/14/16 0936  TROPONINI 0.03* 0.06* 0.06*   BNP (last 3 results) No results for input(s): PROBNP in the last 8760 hours. CBG:  Recent Labs Lab 10/14/16 0446 10/14/16 0751 10/14/16 1619  GLUCAP 375* 291* 190*   Studies: Ct Angio Chest Pe W Or Wo Contrast  Result Date: 10/14/2016 CLINICAL DATA:  Acute onset of shortness of breath and generalized chest pain. Initial encounter. EXAM: CT ANGIOGRAPHY CHEST WITH CONTRAST TECHNIQUE: Multidetector CT imaging of the chest was performed using the standard protocol during bolus administration of intravenous contrast. Multiplanar CT image reconstructions and MIPs were obtained to evaluate the vascular anatomy. CONTRAST:  100 mL of Isovue 370 IV contrast COMPARISON:  Chest radiograph performed 10/13/2016, and CTA of the chest performed 05/16/2016 FINDINGS: Cardiovascular:  There is no evidence of pulmonary embolus. The heart is mildly enlarged. Scattered coronary artery calcifications are seen. The patient is status post median sternotomy. The great vessels are grossly unremarkable. Mediastinum/Nodes: Mildly enlarged right paratracheal nodes are seen, measuring up to 1.4 cm in short axis. A 1.3 cm azygoesophageal recess node is also noted. No pericardial effusion is identified. The visualized portions of the thyroid gland are unremarkable. No axillary lymphadenopathy is seen. Mild wall thickening along the distal esophagus could reflect chronic inflammation or possibly mild esophagitis. Lungs/Pleura: Minimal interstitial prominence is noted. The lungs are otherwise clear. There is no evidence of pleural effusion or pneumothorax. No masses are seen. Upper Abdomen: The visualized portions of the liver and spleen are unremarkable. The visualized portions of the gallbladder, pancreas and adrenal glands are within normal limits. Musculoskeletal: No acute osseous abnormalities are identified. The visualized musculature is unremarkable in appearance.  Review of the MIP images confirms the above findings. IMPRESSION: 1. No evidence of pulmonary embolus. 2. Minimal interstitial prominence noted.  Lungs otherwise clear. 3. Prominent mediastinal nodes, measuring up to 1.4 cm in short axis. 4. Mild wall thickening along the distal esophagus could reflect chronic inflammation or possibly mild esophagitis. Would correlate for any associated symptoms. 5. Scattered coronary artery calcifications. Electronically Signed   By: Roanna Raider M.D.   On: 10/14/2016 04:54    Scheduled Meds: . amLODipine  10 mg Oral QHS  . [START ON 10/16/2016] calcitRIOL  0.5 mcg Oral Q M,W,F-HD  . calcium acetate  2,001 mg Oral TID WC  . [START ON 10/16/2016] cinacalcet  30 mg Oral Q M,W,F  . citalopram  40 mg Oral QHS  . clonazePAM  2 mg Oral Daily  . clopidogrel  75 mg Oral QHS  . enoxaparin (LOVENOX) injection  30 mg Subcutaneous Daily  . gabapentin  200 mg Oral Q4H  . HYDROmorphone  2 mg Oral TID  . insulin aspart  0-5 Units Subcutaneous QHS  . insulin aspart  0-9 Units  Subcutaneous TID WC  . insulin glargine  30 Units Subcutaneous Daily  . levothyroxine  75 mcg Oral QAC breakfast  . metoCLOPramide  10 mg Oral TID  . metoprolol tartrate  100 mg Oral QHS  . metoprolol tartrate  100 mg Oral Once per day on Sun Tue Thu Sat  . [START ON 10/16/2016] metoprolol tartrate  50 mg Oral Once per day on Mon Wed Fri  . oxyCODONE  10 mg Oral Once in dialysis  . sodium chloride flush  3 mL Intravenous Q12H  . sucralfate  1 g Oral TID WC & HS   Continuous Infusions: . sodium chloride     PRN Meds: sodium chloride, acetaminophen **OR** acetaminophen, calcium acetate, labetalol, levalbuterol, morphine injection, ondansetron **OR** ondansetron (ZOFRAN) IV, sodium chloride flush  Time spent: 35 minutes  Author: Lynden Oxford, MD [facility] Pager: [phone] 10/14/2016 7:34 PM  If 7PM-7AM, please contact night-coverage at [email or web address], password [facility]

## 2016-10-14 NOTE — Plan of Care (Signed)
Problem: Safety: Goal: Ability to remain free from injury will improve Outcome: Progressing Patient educated on use of call light system. Verbalizes understanding of need to call for assistance prior to ambulation when necessary   

## 2016-10-14 NOTE — Progress Notes (Signed)
Patient had one emesis episode after medication administration. Stated he always has to eat when taking meds. Administered Zofran 2 mg IV. Expressed relief. Will continue to monitor.

## 2016-10-14 NOTE — Procedures (Signed)
   I was present at this dialysis session, have reviewed the session itself and made  appropriate changes [name] MD [facility] pager [phone]   10/14/2016, 2:18 PM

## 2016-10-14 NOTE — ED Notes (Signed)
Attempted to call report

## 2016-10-14 NOTE — Consult Note (Signed)
[facility] Renal Consultation Note    Indication for Consultation:  Management of ESRD/hemodialysis; anemia, hypertension/volume and secondary hyperparathyroidism PCP:  Norberto Sorenson MD  HPI: Russell [name] is a 46 y.o. male with ESRD on MWF HD who recently transferred from [facility] to [place].  He was just admitted in September after missing dialysis for a week due to a trip to [place].  PMHx is significant for poorly controlled DM, CAD s/p CABG x 3 09/2015, HTN, prior DVR tx with zarelto, hypothyroidism, obesity.    This past week he missed HD Monday and came Tuesday for make up then Wednesday as usual.  The pre HD weight Wednesday was probably erroneous as it was below his post HD weight from Tuesday.  He had a net UF 2.3 on Wednesday and left higher than his pre HD weight.  He came back  Friday having gained 5.5 kg in two days!!  He had a net UF of 4.9 Friday with post wt 121.2 (EDW 118)and arrangements had been made for him today for an extra treatment Saturday 10/13.   He presented in the ED last evening with stabbing chest pain and SOB, which has since resolved. BP  in the ED was variable intial 210/100 EKG sinus tach K 4.1 glu 253 POC trop was 0.2 - repeat 0.06 hgb 10 WBC 7.4 CXR no overt edema O2 sat was 97% on room air.  He was admitted for further evaluation and treatment and HD today. He also had N and V earlier this am but feels ok now.    Past Medical History:  Diagnosis Date  . Anxiety   . Asthma   . 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 Daily headache   . Depression   . DVT (deep venous thrombosis), H/o 01/2014-on Xarelto 03/24/2014   LLE  . ESRD (end stage renal disease) on dialysis [facility])    "[place], MWF" (06/29/2016)  . Hypertension   . Hypothyroidism   . Nephrotic syndrome 05/18/2014  . Type 2 diabetes mellitus  with diabetic nephropathy [facility])    Past Surgical History:  Procedure Laterality Date  . ANKLE FRACTURE SURGERY Right 1988  . AV FISTULA PLACEMENT Left 01/01/2015   Procedure: CREATION OF LEFT RADIAL CEPHALIC ARTERIOVENOUS (AV) FISTULA ;  Surgeon: Pryor Ochoa, MD;  Location: [facility] OR;  Service: Vascular;  Laterality: Left;  . CAPD REMOVAL N/A 05/19/2016   Procedure: PD CATH REMOVAL;  Surgeon: Abigail Miyamoto, MD;  Location: [facility] OR;  Service: General;  Laterality: N/A;  . CARDIAC CATHETERIZATION N/A 09/22/2015   Procedure: Left Heart Cath and Coronary Angiography;  Surgeon: Iran Ouch, MD;  Location: [facility] INVASIVE CV LAB;  Service: Cardiovascular;  Laterality: N/A;  . CORONARY ARTERY BYPASS GRAFT N/A 09/28/2015   Procedure: CORONARY ARTERY BYPASS GRAFTING (CABG) x 3 UTILIZING LEFT MAMMARY ARTERY AND ENDOSCOPICALLY HARVESTED LEFT GREATER SAPHENOUS VEIN.;  Surgeon: Delight Ovens, MD;  Location: [facility] OR;  Service: Open Heart Surgery;  Laterality: N/A;  . ENDOVEIN HARVEST OF GREATER SAPHENOUS VEIN Left 09/28/2015   Procedure: ENDOVEIN HARVEST OF GREATER SAPHENOUS VEIN;  Surgeon: Delight Ovens, MD;  Location: [facility] OR;  Service: Open Heart Surgery;  Laterality: Left;  . ESOPHAGOGASTRODUODENOSCOPY N/A 03/29/2014   Procedure: ESOPHAGOGASTRODUODENOSCOPY (EGD);  Surgeon: Dorena Cookey, MD;  Location: [facility]COPY;  Service: Endoscopy;  Laterality: N/A;  .  EYE SURGERY    . FRACTURE SURGERY    . INSERTION OF DIALYSIS CATHETER Right 01/05/2015   Procedure: INSERTION OF RIGHT INTERNAL JUGULAR DIALYSIS CATHETER;  Surgeon: Fransisco Hertz, MD;  Location: [facility] OR;  Service: Vascular;  Laterality: Right;  . LEFT HEART CATH AND CORS/GRAFTS ANGIOGRAPHY N/A 09/13/2016   Procedure: LEFT HEART CATH AND CORS/GRAFTS ANGIOGRAPHY;  Surgeon: Kathleene Hazel, MD;  Location: [facility] INVASIVE CV LAB;  Service: Cardiovascular;  Laterality: N/A;  . RETINAL LASER PROCEDURE Bilateral   . TEE WITHOUT CARDIOVERSION N/A 09/28/2015    Procedure: TRANSESOPHAGEAL ECHOCARDIOGRAM (TEE);  Surgeon: Delight Ovens, MD;  Location: [facility] OR;  Service: Open Heart Surgery;  Laterality: N/A;  . TONSILLECTOMY AND ADENOIDECTOMY  1970s   Family History  Problem Relation Age of Onset  . Obesity Mother        Patient states that family members have no other medical illnesses other than what I have described  . Kidney cancer Maternal Grandmother   . Cancer Father 50       AML  . Heart disease Unknown        No family history   Social History:  reports that he has never smoked. He has never used smokeless tobacco. He reports that he drinks alcohol. He reports that he does not use drugs. Allergies  Allergen Reactions  . Ibuprofen Other (See Comments)    MD told patient not to take due to kidney disease  . Nsaids Other (See Comments)    Told to avoid all nsaids due to kidney disease   . Tape Other (See Comments)    Welts result, if left for a long amount of time   Prior to Admission medications   Medication Sig Start Date End Date Taking? Authorizing Provider  acetaminophen (TYLENOL) 500 MG tablet Take 1 tablet (500 mg total) by mouth every 6 (six) hours as needed for moderate pain. Patient taking differently: Take 1,000 mg by mouth every 6 (six) hours as needed (for pain).  09/29/14  Yes Albertine Grates, MD  albuterol (PROVENTIL HFA;VENTOLIN HFA) 108 (90 Base) MCG/ACT inhaler Inhale 1 puff into the lungs every 6 (six) hours as needed for wheezing or shortness of breath. Patient taking differently: Inhale 2 puffs into the lungs every 8 (eight) hours as needed for wheezing or shortness of breath.  03/11/15  Yes [name], MD  albuterol (PROVENTIL) (2.5 MG/3ML) 0.083% nebulizer solution Take 3 mLs (2.5 mg total) by nebulization every 4 (four) hours as needed for wheezing. 09/20/16  Yes [name], MD  albuterol-ipratropium (COMBIVENT) 18-103 MCG/ACT inhaler Inhale 2 puffs into the lungs every 4 (four) hours. Patient taking differently:  Inhale 2 puffs into the lungs 3 (three) times daily as needed for wheezing or shortness of breath.  09/03/14  Yes [name], MD  amLODipine (NORVASC) 10 MG tablet Take 1 tablet (10 mg total) by mouth daily. DO NOT TAKE ON DIALYSIS DAYS 08/17/16  Yes Lewayne Bunting, MD  aspirin EC 81 MG EC tablet Take 1 tablet (81 mg total) by mouth daily. 10/06/15  Yes Barrett, Erin R, PA-C  atorvastatin (LIPITOR) 80 MG tablet Take 1 tablet (80 mg total) by mouth daily at 6 PM. 04/17/16  Yes Shade Flood, MD  calcitRIOL (ROCALTROL) 0.25 MCG capsule Take 1 capsule (0.25 mcg total) by mouth every Monday, Wednesday, and Friday with hemodialysis. Patient taking differently: Take 0.25 mcg by mouth See admin instructions. Tuesday Thursday Saturday 10/06/15  Yes Barrett,  Erin R, PA-C  calcium acetate (PHOSLO) 667 MG capsule Take 2 capsules (1,334 mg total) by mouth 3 (three) times daily with meals. Patient taking differently: Take 1,334-2,001 mg by mouth See admin instructions. Take 2001 mg by mouth 3 times daily with meals and take 1334 mg by mouth with snacks 01/08/15  Yes Rolly Salter, MD  cinacalcet [facility]) 30 MG tablet Take 30 mg by mouth every Monday, Wednesday, and Friday.   Yes [provider]  citalopram (CELEXA) 40 MG tablet Take 40 mg by mouth at bedtime.    Yes [provider]  clonazePAM (KLONOPIN) 2 MG tablet Take 2 mg by mouth daily.   Yes [provider]  clopidogrel (PLAVIX) 75 MG tablet Take 1 tablet (75 mg total) by mouth daily. Patient taking differently: Take 75 mg by mouth at bedtime.  01/27/16  Yes Lewayne Bunting, MD  cyclobenzaprine (FLEXERIL) 10 MG tablet Take 1 tablet (10 mg total) by mouth at bedtime. 05/22/15  Yes [name], MD  gabapentin (NEURONTIN) 100 MG capsule Take 2 capsules (200 mg total) by mouth at bedtime. Patient taking differently: Take 200 mg by mouth every 4 (four) hours.  09/28/16  Yes Maxie Barb, MD   HYDROcodone-acetaminophen (NORCO/VICODIN) 5-325 MG tablet Take 1-2 tablets by mouth every 4 (four) hours as needed for moderate pain. 08/17/16  Yes [name], MD  HYDROmorphone (DILAUDID) 2 MG tablet Take 2 mg by mouth 3 (three) times daily.   Yes [provider]  hydrOXYzine (ATARAX/VISTARIL) 25 MG tablet Take 25 mg by mouth 2 (two) times daily as needed for anxiety.    Yes [provider]  insulin glargine (LANTUS) 100 UNIT/ML injection Inject 0.4 mLs (40 Units total) into the skin 2 (two) times daily. Patient taking differently: Inject 30 Units into the skin daily.  05/20/16  Yes Dhungel, Nishant, MD  insulin lispro (HUMALOG) 100 UNIT/ML injection Inject 0.15-0.3 mLs (15-30 Units total) into the skin every evening. Take one unit per 5 carbs depending on dinner. 11/29/15  Yes Carlus Pavlov, MD  levothyroxine (SYNTHROID, LEVOTHROID) 75 MCG tablet Take 1 tablet (75 mcg total) by mouth daily before breakfast. 09/29/16  Yes Maxie Barb, MD  metoCLOPramide (REGLAN) 5 MG tablet Take 1 tablet (5 mg total) by mouth every 8 (eight) hours as needed for nausea or vomiting. Patient taking differently: Take 10 mg by mouth 3 (three) times daily.  01/10/16  Yes Dong, Robin, DO  metoprolol tartrate (LOPRESSOR) 50 MG tablet Take 2 tablets (100 mg total) by mouth 2 (two) times daily. Patient taking differently: Take 50-100 mg by mouth See admin instructions. Mon. Wed. Fri.  take 50 mg in the morning In the evening he takes 100 mt 09/06/16  Yes Crenshaw, Madolyn Frieze, MD  pantoprazole (PROTONIX) 40 MG tablet Take 1 tablet (40 mg total) by mouth daily. Patient not taking: Reported on 10/13/2016 05/25/16 09/26/17  Rolly Salter, MD   Current Facility-Administered Medications  Medication Dose Route Frequency Provider Last Rate Last Dose  . 0.9 %  sodium chloride infusion  250 mL Intravenous PRN Doutova, Anastassia, MD      . acetaminophen (TYLENOL) tablet 650 mg  650 mg Oral Q6H PRN Therisa Doyne, MD   650 mg at 10/14/16 0342   Or  . acetaminophen (TYLENOL) suppository 650 mg  650 mg Rectal Q6H PRN Doutova, Anastassia, MD      . calcitRIOL (ROCALTROL) capsule 0.25 mcg  0.25 mcg Oral Q  T,Th,Sa-HD Therisa Doyne, MD      . calcium acetate (PHOSLO) capsule 1,334 mg  1,334 mg Oral BID PRN Rolly Salter, MD      . calcium acetate (PHOSLO) capsule 2,001 mg  2,001 mg Oral TID WC Therisa Doyne, MD      . Melene Muller ON 10/16/2016] cinacalcet (SENSIPAR) tablet 30 mg  30 mg Oral Q M,W,F Doutova, Anastassia, MD      . citalopram (CELEXA) tablet 40 mg  40 mg Oral QHS Doutova, Anastassia, MD      . clonazePAM (KLONOPIN) tablet 2 mg  2 mg Oral Daily Doutova, Anastassia, MD      . clopidogrel (PLAVIX) tablet 75 mg  75 mg Oral QHS Doutova, Anastassia, MD      . enoxaparin (LOVENOX) injection 30 mg  30 mg Subcutaneous Daily Doutova, Anastassia, MD      . gabapentin (NEURONTIN) capsule 200 mg  200 mg Oral Q4H Doutova, Anastassia, MD   200 mg at 10/14/16 0555  . HYDROmorphone (DILAUDID) tablet 2 mg  2 mg Oral TID Doutova, Anastassia, MD      . insulin aspart (novoLOG) injection 0-5 Units  0-5 Units Subcutaneous QHS Doutova, Anastassia, MD      . insulin aspart (novoLOG) injection 0-9 Units  0-9 Units Subcutaneous TID WC Therisa Doyne, MD   5 Units at 10/14/16 1610  . insulin glargine (LANTUS) injection 30 Units  30 Units Subcutaneous Daily Therisa Doyne, MD   30 Units at 10/14/16 0607  . labetalol (NORMODYNE,TRANDATE) injection 10 mg  10 mg Intravenous Q2H PRN Therisa Doyne, MD   10 mg at 10/14/16 0559  . levalbuterol (XOPENEX) nebulizer solution 0.63 mg  0.63 mg Nebulization Q6H PRN Doutova, Anastassia, MD      . levothyroxine (SYNTHROID, LEVOTHROID) tablet 75 mcg  75 mcg Oral QAC breakfast Doutova, Anastassia, MD      . metoCLOPramide (REGLAN) tablet 10 mg  10 mg Oral TID Therisa Doyne, MD      . metoprolol tartrate (LOPRESSOR) tablet 100 mg  100 mg Oral QHS  Rolly Salter, MD      . metoprolol tartrate (LOPRESSOR) tablet 100 mg  100 mg Oral Once per day on Sun Tue Thu Sat Rolly Salter, MD      . Melene Muller ON 10/16/2016] metoprolol tartrate (LOPRESSOR) tablet 50 mg  50 mg Oral Once per day on Mon Wed Fri Rolly Salter, MD      . morphine 4 MG/ML injection 2 mg  2 mg Intravenous Q4H PRN Therisa Doyne, MD      . ondansetron (ZOFRAN) tablet 4 mg  4 mg Oral Q6H PRN Therisa Doyne, MD       Or  . ondansetron (ZOFRAN) injection 4 mg  4 mg Intravenous Q6H PRN Doutova, Anastassia, MD      . sodium chloride flush (NS) 0.9 % injection 3 mL  3 mL Intravenous Q12H Doutova, Anastassia, MD   3 mL at 10/14/16 0349  . sodium chloride flush (NS) 0.9 % injection 3 mL  3 mL Intravenous PRN Therisa Doyne, MD   3 mL at 10/14/16 0344  . sucralfate (CARAFATE) 1 GM/10ML suspension 1 g  1 g Oral TID WC & HS Therisa Doyne, MD       Labs: Basic Metabolic Panel:  Recent Labs Lab 10/13/16 1649 10/14/16 0342  NA 136 135  K 4.1 4.1  CL 93* 93*  CO2 31 28  GLUCOSE 343* 329*  BUN 25* 33*  CREATININE 5.47* 6.73*  CALCIUM 9.2 9.1  PHOS  --  6.5*   Liver Function Tests:  Recent Labs Lab 10/14/16 0342  AST 21  ALT 17  ALKPHOS 81  BILITOT 0.7  PROT 6.5  ALBUMIN 3.3*  CBC:  Recent Labs Lab 10/13/16 1649 10/14/16 0342  WBC 7.5 7.4  HGB 11.3* 10.0*  HCT 35.2* 31.6*  MCV 87.6 88.0  PLT 260 234   Cardiac Enzymes:  Recent Labs Lab 10/13/16 2203 10/14/16 0342  TROPONINI 0.03* 0.06*   CBG:  Recent Labs Lab 10/14/16 0446 10/14/16 0751  GLUCAP 375* 291*   Studies/Results: Dg Chest 2 View  Result Date: 10/13/2016 CLINICAL DATA:  Mid chest pain and shortness of breath. EXAM: CHEST  2 VIEW COMPARISON:  09/26/2016 FINDINGS: Stable postsurgical changes from CABG. Moderately enlarged cardiac silhouette. Mediastinal contours appear intact. There is no evidence of focal airspace consolidation, pleural effusion or pneumothorax.  Osseous structures are without acute abnormality. Soft tissues are grossly normal. IMPRESSION: Moderately enlarged cardiac silhouette. No evidence of pulmonary consolidation. Electronically Signed   By: Ted Mcalpine M.D.   On: 10/13/2016 17:59   Ct Angio Chest Pe W Or Wo Contrast  Result Date: 10/14/2016 CLINICAL DATA:  Acute onset of shortness of breath and generalized chest pain. Initial encounter. EXAM: CT ANGIOGRAPHY CHEST WITH CONTRAST TECHNIQUE: Multidetector CT imaging of the chest was performed using the standard protocol during bolus administration of intravenous contrast. Multiplanar CT image reconstructions and MIPs were obtained to evaluate the vascular anatomy. CONTRAST:  100 mL of Isovue 370 IV contrast COMPARISON:  Chest radiograph performed 10/13/2016, and CTA of the chest performed 05/16/2016 FINDINGS: Cardiovascular:  There is no evidence of pulmonary embolus. The heart is mildly enlarged. Scattered coronary artery calcifications are seen. The patient is status post median sternotomy. The great vessels are grossly unremarkable. Mediastinum/Nodes: Mildly enlarged right paratracheal nodes are seen, measuring up to 1.4 cm in short axis. A 1.3 cm azygoesophageal recess node is also noted. No pericardial effusion is identified. The visualized portions of the thyroid gland are unremarkable. No axillary lymphadenopathy is seen. Mild wall thickening along the distal esophagus could reflect chronic inflammation or possibly mild esophagitis. Lungs/Pleura: Minimal interstitial prominence is noted. The lungs are otherwise clear. There is no evidence of pleural effusion or pneumothorax. No masses are seen. Upper Abdomen: The visualized portions of the liver and spleen are unremarkable. The visualized portions of the gallbladder, pancreas and adrenal glands are within normal limits. Musculoskeletal: No acute osseous abnormalities are identified. The visualized musculature is unremarkable in  appearance. Review of the MIP images confirms the above findings. IMPRESSION: 1. No evidence of pulmonary embolus. 2. Minimal interstitial prominence noted.  Lungs otherwise clear. 3. Prominent mediastinal nodes, measuring up to 1.4 cm in short axis. 4. Mild wall thickening along the distal esophagus could reflect chronic inflammation or possibly mild esophagitis. Would correlate for any associated symptoms. 5. Scattered coronary artery calcifications. Electronically Signed   By: Roanna Raider M.D.   On: 10/14/2016 04:54    ROS: As per HPI otherwise negative.  Physical Exam: Vitals:   10/14/16 0447 10/14/16 0546 10/14/16 0637 10/14/16 0754  BP: (!) 166/95 (!) 205/108 (!) 187/102 (!) 210/100  Pulse: 98 100 88 93  Resp: Temp:  98.2 F (36.8 C)  98.5 F (36.9 C)  TempSrc:  Oral  Oral  SpO2: 96% 99% 97% 97%  Weight:  120.5 kg (265 lb 9.6 oz)  Height:  6\' 2"  (1.88 m)       General: obese WM NAD breathing easily at rest on room air Head: NCAT sclera not icteric MMM Neck: Supple.  Lungs: CTA bilaterally without wheezes, rales, or rhonchi. Breathing is unlabored. Heart: RRR with S1 S2.  Abdomen: soft NT + BS obses Lower extremities: tr - 1+ LE edema  Neuro: A & O  X 3. Moves all extremities spontaneously. Psych:  Responds to questions appropriately with a normal affect. Dialysis Access: left lower AVF + bruit  Dialysis Orders: MWF [facility] 4 hr left lower AVF EDW 118 2 K 2.25 Ca profile 4 heparin 8000 with 2500 mid tmt venofer 50 calcitriol 0.5 no ESA sensipar 30 with HD- last Mircera 75 on 10/10  Recent labs: hgb 11 10/10 24% sat iPTH 485 Ca 8.5 P 11.7 Hgb A1c 11.7  Assessment/Plan: 1. Volume excess - has high IDWG compounded by the fact he missed HD 10/8 and came 10/9 and 10/10 for makeup up. There was a false low pre weight error 10/10 which made true pre weight high 10/12.  His net UF Friday 10/12 was 4.9 with a post weight of 121.2.  The plan was for him to return to HD  Saturday for an extra treatment but he came to the ED with CP 2. Chest pain - hx NSTEMI/CABG 09/2015 POC trop 0.02, trop I 0.06 - trend 3. ESRD -  MWF - extra treatment today primarily for ultrafiltration - next HD Monday if still here 4. Hypertension/volume  - volume as above - BP a little higher than usual Friday pre HD was 176/110 sitting 184/109 standing with post BPs ~140/80s sitting and standing - home meds are reported differently from what was on hospital discharge summary and what was on his outpatient med list - this needs to be sorted out. Need to lower EDW as much as possible and simplify his medicines- challenge volume and lower EDW while possible here. Will add back amlodipine.  BP meds need to be dosed at HS. 5. Anemia  -hgb 10 - last ESA 10/10 - then held - watch trend; weekly venofer 6. Metabolic bone disease -  Continue binders/calcitriol/sensipar- notoriously noncompliant with binders 7. Nutrition -changed to renal carb mod 8. Poorly controlled DM - last A1C 11.7 , BS run in the 400s at dialysis - may be causing increased thirst  That would contribute to high IDWG. Nneeds close monitoring after discharge 9. Medical and dialysis noncompliance - contributory to high IDWG, uncontrolled DM, failed PD in the past. Discussed with primary - needs intensive outpatient management.  Primary to see if he is eligible for [facility] services.  [name], PA-C [facility] Beeper [phone] 10/14/2016, 8:59 AM   Pt seen, examined and agree w A/P as above. ESRD pt admitted for SOB and has acute on chronic vol overload. Plan for HD , extra, today with maximal UF as tolerated.   [name] MD [employer] pager [phone]   10/14/2016, 2:11 PM

## 2016-10-14 NOTE — ED Notes (Signed)
Dr. Kara Pacer notified on pt.'s cbg = 375 .

## 2016-10-14 NOTE — Progress Notes (Signed)
Dialysis treatment completed.  5500 mL ultrafiltrated and net fluid removal 5000 mL.    Patient status unchanged. Lung sounds diminished and clear to ausculation in all fields. Generalized edema. Cardiac: NSR.  Disconnected lines and removed needles.  Pressure held for 10 minutes and band aid/gauze dressing applied.  Report given to bedside RN, Lindwood Qua.

## 2016-10-14 NOTE — Progress Notes (Signed)
  Echocardiogram 2D Echocardiogram has been performed.  Russell [name] 10/14/2016, 5:34 PM

## 2016-10-14 NOTE — Progress Notes (Signed)
Patient arrived to unit per bed.  Reviewed treatment plan and this RN agrees.  Report received from bedside RN, Lindwood Qua.  Consent obtained.  Patient A & O X 4. Lung sounds diminished and clear to ausculation in all fields. Generalized non pitting edema. Cardiac: NSr.  Prepped LLAVF with alcohol and cannulated with two 15 gauge needles.  Pulsation of blood noted.  Flushed access well with saline per protocol.  Connected and secured lines and initiated tx at 1009.  UF goal of 5500 mL and net fluid removal of 5000 mL.  Will continue to monitor.

## 2016-10-14 NOTE — ED Notes (Signed)
Dr, Kara Pacer notified on pt.'s elevated blood pressure , headache and requested  pt.'s diet order.

## 2016-10-15 ENCOUNTER — Inpatient Hospital Stay (HOSPITAL_COMMUNITY): Payer: Medicare Other

## 2016-10-15 LAB — CBC WITH DIFFERENTIAL/PLATELET
BASOS ABS: 0 10*3/uL (ref 0.0–0.1)
BASOS PCT: 1 %
Eosinophils Absolute: 0.4 10*3/uL (ref 0.0–0.7)
Eosinophils Relative: 6 %
HEMATOCRIT: 35.3 % — AB (ref 39.0–52.0)
Hemoglobin: 10.8 g/dL — ABNORMAL LOW (ref 13.0–17.0)
LYMPHS PCT: 18 %
Lymphs Abs: 1.3 10*3/uL (ref 0.7–4.0)
MCH: 27.7 pg (ref 26.0–34.0)
MCHC: 30.6 g/dL (ref 30.0–36.0)
MCV: 90.5 fL (ref 78.0–100.0)
MONO ABS: 0.7 10*3/uL (ref 0.1–1.0)
Monocytes Relative: 10 %
NEUTROS ABS: 4.4 10*3/uL (ref 1.7–7.7)
NEUTROS PCT: 65 %
PLATELETS: 241 10*3/uL (ref 150–400)
RBC: 3.9 MIL/uL — AB (ref 4.22–5.81)
RDW: 16.5 % — AB (ref 11.5–15.5)
WBC: 6.8 10*3/uL (ref 4.0–10.5)

## 2016-10-15 LAB — COMPREHENSIVE METABOLIC PANEL
ALK PHOS: 83 U/L (ref 38–126)
ALT: 18 U/L (ref 17–63)
ANION GAP: 13 (ref 5–15)
AST: 20 U/L (ref 15–41)
Albumin: 3.3 g/dL — ABNORMAL LOW (ref 3.5–5.0)
BILIRUBIN TOTAL: 0.6 mg/dL (ref 0.3–1.2)
BUN: 32 mg/dL — ABNORMAL HIGH (ref 6–20)
CALCIUM: 9.1 mg/dL (ref 8.9–10.3)
CO2: 27 mmol/L (ref 22–32)
Chloride: 93 mmol/L — ABNORMAL LOW (ref 101–111)
Creatinine, Ser: 6.41 mg/dL — ABNORMAL HIGH (ref 0.61–1.24)
GFR, EST AFRICAN AMERICAN: 11 mL/min — AB (ref >60)
GFR, EST NON AFRICAN AMERICAN: 9 mL/min — AB (ref >60)
GLUCOSE: 228 mg/dL — AB (ref 65–99)
POTASSIUM: 4.2 mmol/L (ref 3.5–5.1)
Sodium: 133 mmol/L — ABNORMAL LOW (ref 135–145)
TOTAL PROTEIN: 6.7 g/dL (ref 6.5–8.1)

## 2016-10-15 LAB — GLUCOSE, CAPILLARY
Glucose-Capillary: 229 mg/dL — ABNORMAL HIGH (ref 65–99)
Glucose-Capillary: 267 mg/dL — ABNORMAL HIGH (ref 65–99)

## 2016-10-15 MED ORDER — HYDRALAZINE HCL 25 MG PO TABS: 25.0000 mg | ORAL_TABLET | Freq: Three times a day (TID) | ORAL | 0 refills | Status: DC | PRN

## 2016-10-15 MED ORDER — METOPROLOL TARTRATE 100 MG PO TABS: ORAL_TABLET | ORAL | 0 refills | Status: DC

## 2016-10-15 MED ORDER — METOPROLOL TARTRATE 50 MG PO TABS
50.0000 mg | ORAL_TABLET | Freq: Every day | ORAL | Status: DC
  Administered 2016-10-15: 50 mg via ORAL
  Filled 2016-10-15: qty 1

## 2016-10-15 MED ORDER — AMLODIPINE BESYLATE 5 MG PO TABS: 5.0000 mg | ORAL_TABLET | ORAL | 0 refills | Status: DC

## 2016-10-15 NOTE — Care Management Note (Signed)
Case Management Note Donn Pierini RN, BSN Unit 4E-Case Manager-- weekend coverage for 6E [phone]  Patient Details  Name: Russell [name] MRN: [id number] Date of Birth: April 07, 1970  Subjective/Objective:   Pt admitted with chest pain                   Action/Plan: PTA pt lived at home- on last admit was to have [facility] services - per chart referral was sent to [facility]- spoke with pt via TC to room- per pt he did receive phone calls from [place] [facility] but he states that he told them he did not want services from them- he wants services with [facility]- pt is not currently receiving HH services from any agency- spoke with MD- who is going to place new HHPT/OT orders per pt choice will refer to [facility]- Call made to [facility] with [facility] for [facility] services- referral has been accepted. Confirmed pt's address and phone # in epic.   Expected Discharge Date:  10/15/16               Expected Discharge Plan:  Home w Home Health Services  In-House Referral:     Discharge planning Services  CM Consult  Post Acute Care Choice:  Home Health Choice offered to:  Patient  DME Arranged:    DME Agency:     HH Arranged:  PT, OT HH Agency:  [facility]  Status of Service:  Completed, signed off  If discussed at Long Length of Stay Meetings, dates discussed:    Discharge Disposition: home/home health   Additional Comments:  Darrold Span, RN 10/15/2016, 2:16 PM

## 2016-10-15 NOTE — Progress Notes (Signed)
[facility] Progress Note  Subjective: feeling better, 5 L off with HD, no BP drops.  BP's still up.   Vitals:   10/14/16 1948 10/15/16 0017 10/15/16 0452 10/15/16 0838  BP: (!) 161/87 (!) 140/59 134/74 (!) 152/90  Pulse: (!) 104 96 81 83  Resp:      Temp: 98.8 F (37.1 C) 98.4 F (36.9 C) 97.6 F (36.4 C) 98 F (36.7 C)  TempSrc: Oral Oral Axillary   SpO2: 100% 96% 94% 95%  Weight:   118.5 kg (261 lb 3.2 oz)   Height:        Inpatient medications: . amLODipine  10 mg Oral QHS  . [START ON 10/16/2016] calcitRIOL  0.5 mcg Oral Q M,W,F-HD  . calcium acetate  2,001 mg Oral TID WC  . [START ON 10/16/2016] cinacalcet  30 mg Oral Q M,W,F  . citalopram  40 mg Oral QHS  . clonazePAM  2 mg Oral Daily  . clopidogrel  75 mg Oral QHS  . enoxaparin (LOVENOX) injection  30 mg Subcutaneous Daily  . gabapentin  200 mg Oral Q4H  . HYDROmorphone  2 mg Oral TID  . insulin aspart  0-5 Units Subcutaneous QHS  . insulin aspart  0-9 Units Subcutaneous TID WC  . insulin glargine  30 Units Subcutaneous Daily  . levothyroxine  75 mcg Oral QAC breakfast  . metoCLOPramide  10 mg Oral TID  . metoprolol tartrate  100 mg Oral QHS  . metoprolol tartrate  50 mg Oral Daily  . oxyCODONE  10 mg Oral Once in dialysis  . sodium chloride flush  3 mL Intravenous Q12H  . sucralfate  1 g Oral TID WC & HS   . sodium chloride     sodium chloride, acetaminophen **OR** acetaminophen, calcium acetate, labetalol, levalbuterol, morphine injection, ondansetron **OR** ondansetron (ZOFRAN) IV, sodium chloride flush  Exam: Obese, pleasant, no distress No jvd Chest clear bilat RRR no mrg Abd obese soft ntnd Ext no LE edema LFA AVF +bruit NF, Ox 3  Dialysis: MWF [facility] 4h   118kg   2/2.25  Hep 8000 then 2500 -venofer 50 -calc 0.5 ug -sensipar 30 -last mircera 75 on 10/10      Impression: 1. SOB /HTN urgency - due to vol overload. CXR was borderline but CT chest showed diffuse GG changes c/w  lung edema. Improved clinically sp 5 L UF on HD yest.  Down to dry wt.  2. ESRD - HD MWF 3. DM on insulin.  4. Vol - better, down to dry 5. Obesity 6. HTN - better  Plan - ok for dc home, have d/w primary   [name] MD [facility] pager [phone]   10/15/2016, 12:04 PM    Recent Labs Lab 10/13/16 1649 10/14/16 0342 10/15/16 0246  NA 136 135 133*  K 4.1 4.1 4.2  CL 93* 93* 93*  CO2 GLUCOSE 343* 329* 228*  BUN 25* 33* 32*  CREATININE 5.47* 6.73* 6.41*  CALCIUM 9.2 9.1 9.1  PHOS  --  6.5*  --     Recent Labs Lab 10/14/16 0342 10/15/16 0246  AST 21 20  ALT 17 18  ALKPHOS 81 83  BILITOT 0.7 0.6  PROT 6.5 6.7  ALBUMIN 3.3* 3.3*    Recent Labs Lab 10/13/16 1649 10/14/16 0342 10/15/16 0246  WBC 7.5 7.4 6.8  NEUTROABS  --   --  4.4  HGB 11.3* 10.0* 10.8*  HCT 35.2* 31.6* 35.3*  MCV 87.6 88.0 90.5  PLT 260 234 241   Iron/TIBC/Ferritin/ %Sat    Component Value Date/Time   IRON 17 (L) 05/17/2016 0711   TIBC 216 (L) 05/17/2016 0711   FERRITIN 139 05/17/2016 0711   IRONPCTSAT 8 (L) 05/17/2016 1324

## 2016-10-15 NOTE — Progress Notes (Signed)
Patient given discharge instructions. Reinforced medication compliance. Doctor ordered HHPT [facility]. Patient spoke to case manager and has everything sorted out. Educated on medication changes. Patient expressed understanding, and stated no further questions. PIV removed. Aware of follow up with PCP in a week. Escorted out via Psychologist, sport and exercise.

## 2016-10-16 DIAGNOSIS — D509 Iron deficiency anemia, unspecified: Secondary | ICD-10-CM | POA: Diagnosis not present

## 2016-10-16 DIAGNOSIS — D631 Anemia in chronic kidney disease: Secondary | ICD-10-CM | POA: Diagnosis not present

## 2016-10-16 DIAGNOSIS — Z23 Encounter for immunization: Secondary | ICD-10-CM | POA: Diagnosis not present

## 2016-10-16 DIAGNOSIS — N186 End stage renal disease: Secondary | ICD-10-CM | POA: Diagnosis not present

## 2016-10-16 DIAGNOSIS — E1129 Type 2 diabetes mellitus with other diabetic kidney complication: Secondary | ICD-10-CM | POA: Diagnosis not present

## 2016-10-16 DIAGNOSIS — N2581 Secondary hyperparathyroidism of renal origin: Secondary | ICD-10-CM | POA: Diagnosis not present

## 2016-10-18 DIAGNOSIS — D631 Anemia in chronic kidney disease: Secondary | ICD-10-CM | POA: Diagnosis not present

## 2016-10-18 DIAGNOSIS — N2581 Secondary hyperparathyroidism of renal origin: Secondary | ICD-10-CM | POA: Diagnosis not present

## 2016-10-18 DIAGNOSIS — N186 End stage renal disease: Secondary | ICD-10-CM | POA: Diagnosis not present

## 2016-10-18 DIAGNOSIS — Z23 Encounter for immunization: Secondary | ICD-10-CM | POA: Diagnosis not present

## 2016-10-18 DIAGNOSIS — E1129 Type 2 diabetes mellitus with other diabetic kidney complication: Secondary | ICD-10-CM | POA: Diagnosis not present

## 2016-10-18 DIAGNOSIS — D509 Iron deficiency anemia, unspecified: Secondary | ICD-10-CM | POA: Diagnosis not present

## 2016-10-18 NOTE — Discharge Summary (Signed)
[facility] Discharge Summary   Patient: Russell [name] WGN:[id number]:[id number]   PCP: Patient, No Pcp Per DOB: 05/21/1970   Date of admission: 10/13/2016   Date of discharge: 10/15/2016    Discharge Diagnoses:  Active Problems:   Hypothyroidism (acquired)   Chest pain   Uncontrolled diabetes mellitus type 2 with peripheral artery disease (HCC)   Essential hypertension, benign   ESRD on dialysis [facility](HCC)   Coronary artery disease involving native coronary artery of native heart without angina pectoris   Anemia due to end stage renal disease (HCC)   Hypertensive urgency   Admitted From: home Disposition:  home  Recommendations for Outpatient Follow-up:  1.  please follow-up with PCP in one week 2. Remain compliant with medical regim  Follow-up Information    PCP. Schedule an appointment as soon as possible for a visit in 1 week(s).        [facility].   Why:  HHPT/OT arranged- they will call you to set up home visits Contact information: [address] [place] [address] [phone]          Diet recommendation: renal diet  Activity: The patient is advised to gradually reintroduce usual activities.  Discharge Condition: good  Code Status: full code  History of present illness: As per the H and P dictated on admission, "Russell BirkenheadSteven B Holness is a 46 y.o. male with medical history significant ofinsulin-dependent diabetes, coronary artery disease status post CABG, hypertension, hypothyroidism, ESRD on hemodialysis M,W,F    Presented with Chest pain and dyspnea. The dyspnea has been since yesterday  As well as chest pain that has started last night feels like stabbing lasts 45 sec.its happening every few minutes. Reports having pain like this in the past usually takes dilaudid for this at home states he only had 2 pills he wanted to save them so he did take them. Lowering BP seemed to help with chest pain a bit.  He have   HD today but  thinks that not all the fluid was taken out.  Reportedly contacted his nephrologist who told him to come to ER to get HD.  Reports wheezing chest tightness that is persistent. Nothing seems to make it better. Reports not taking his BP meds today, BP in ER SBP >200"  Hospital Course:  Summary of his active problems in the hospital is as following. 1. Hypertensive emergency. Blood pressure significantly elevated on admission. With symptoms of chest pain and shortness of breath. Currently on room air. Improved with hemodialysis. Continuing home regimen. Would like to simplify patient's regimen to improve compliance at home. Patient told me that he takes 100 mg metoprolol Twice a day on the day of dialysis  And once a day on day before hemodialysis. He takes hydralazine when necessary for high blood pressure and if it is not controlled with that, amlodipine when necessary after that. He does not take hydralazine and amlodipine on scheduled basis. To simplify his regimen I'm recommending him following change Continue to take metoprolol 100 mg BID on HD days and Once a day off HD. Add amlodipine 5 mg BID on HD days and Once a day off HD. Use hydralazine when necessary for  Uncontrolled blood pressure. Discharge was explained to the patient and he verified understanding.  2. ESRD. On hemodialysis Monday Wednesday Friday. Went for dialysis on Friday although not sure whether he completed his treatment. Received acute hemodialysis here in the hospital. Monitor daily weight.  3. Hypothyroidism.  Continuing home regimen.  4. Uncontrolled type 2 diabetes mellitus with hyperglycemia. On sliding scale insulin.  5. Anemia of chronic disease. Continue to monitor.   All other chronic medical condition were stable during the hospitalization.  Patient was ambulatory without any assistance. On the day of the discharge the patient's vitals were stable, and no other acute medical condition were  reported by patient. the patient was felt safe to be discharge at home with family.  Procedures and Results:  HD   Consultations:  Nephrology   DISCHARGE MEDICATION: Discharge Medication List as of 10/15/2016  4:28 PM    START taking these medications   Details  hydrALAZINE (APRESOLINE) 25 MG tablet Take 1 tablet (25 mg total) by mouth 3 (three) times daily as needed (take for blood pressure above 160 if not controlled with home meds)., Starting Sun 10/15/2016, Until Mon 10/15/2017, Normal      CONTINUE these medications which have CHANGED   Details  amLODipine (NORVASC) 5 MG tablet Take 1 tablet (5 mg total) by mouth as directed. Take twice a day on dialysis days and once a day on other days., Starting Sun 10/15/2016, Normal    metoprolol tartrate (LOPRESSOR) 100 MG tablet Take 1 tab twice a day on dialysis days and once a day on other days., Normal      CONTINUE these medications which have NOT CHANGED   Details  acetaminophen (TYLENOL) 500 MG tablet Take 1 tablet (500 mg total) by mouth every 6 (six) hours as needed for moderate pain., Starting Tue 09/29/2014, Normal    albuterol (PROVENTIL HFA;VENTOLIN HFA) 108 (90 Base) MCG/ACT inhaler Inhale 1 puff into the lungs every 6 (six) hours as needed for wheezing or shortness of breath., Starting Thu 03/11/2015, Normal    albuterol (PROVENTIL) (2.5 MG/3ML) 0.083% nebulizer solution Take 3 mLs (2.5 mg total) by nebulization every 4 (four) hours as needed for wheezing., Starting Wed 09/20/2016, Print    albuterol-ipratropium (COMBIVENT) 18-103 MCG/ACT inhaler Inhale 2 puffs into the lungs every 4 (four) hours., Starting Thu 09/03/2014, Normal    aspirin EC 81 MG EC tablet Take 1 tablet (81 mg total) by mouth daily., Starting Wed 10/06/2015, No Print    atorvastatin (LIPITOR) 80 MG tablet Take 1 tablet (80 mg total) by mouth daily at 6 PM., Starting Mon 04/17/2016, Normal    calcitRIOL (ROCALTROL) 0.25 MCG capsule Take 1 capsule (0.25 mcg  total) by mouth every Monday, Wednesday, and Friday with hemodialysis., Starting Wed 10/06/2015, Print    calcium acetate (PHOSLO) 667 MG capsule Take 2 capsules (1,334 mg total) by mouth 3 (three) times daily with meals., Starting Fri 01/08/2015, Normal    cinacalcet (SENSIPAR) 30 MG tablet Take 30 mg by mouth every Monday, Wednesday, and Friday., Historical Med    citalopram (CELEXA) 40 MG tablet Take 40 mg by mouth at bedtime. , Historical Med    clonazePAM (KLONOPIN) 2 MG tablet Take 2 mg by mouth daily., Historical Med    clopidogrel (PLAVIX) 75 MG tablet Take 1 tablet (75 mg total) by mouth daily., Starting Thu 01/27/2016, Normal    cyclobenzaprine (FLEXERIL) 10 MG tablet Take 1 tablet (10 mg total) by mouth at bedtime., Starting Sat 05/22/2015, Normal    gabapentin (NEURONTIN) 100 MG capsule Take 2 capsules (200 mg total) by mouth at bedtime., Starting Thu 09/28/2016, No Print    HYDROcodone-acetaminophen (NORCO/VICODIN) 5-325 MG tablet Take 1-2 tablets by mouth every 4 (four) hours as needed for moderate pain., Starting Thu 08/17/2016,  Print    HYDROmorphone (DILAUDID) 2 MG tablet Take 2 mg by mouth 3 (three) times daily., Historical Med    hydrOXYzine (ATARAX/VISTARIL) 25 MG tablet Take 25 mg by mouth 2 (two) times daily as needed for anxiety. , Historical Med    insulin glargine (LANTUS) 100 UNIT/ML injection Inject 0.4 mLs (40 Units total) into the skin 2 (two) times daily., Starting Sat 05/20/2016, No Print    insulin lispro (HUMALOG) 100 UNIT/ML injection Inject 0.15-0.3 mLs (15-30 Units total) into the skin every evening. Take one unit per 5 carbs depending on dinner., Starting Mon 11/29/2015, Normal    levothyroxine (SYNTHROID, LEVOTHROID) 75 MCG tablet Take 1 tablet (75 mcg total) by mouth daily before breakfast., Starting Fri 09/29/2016, Print    metoCLOPramide (REGLAN) 5 MG tablet Take 1 tablet (5 mg total) by mouth every 8 (eight) hours as needed for nausea or vomiting.,  Starting Mon 01/10/2016, Print      STOP taking these medications     pantoprazole (PROTONIX) 40 MG tablet        Allergies  Allergen Reactions  . Ibuprofen Other (See Comments)    MD told patient not to take due to kidney disease  . Nsaids Other (See Comments)    Told to avoid all nsaids due to kidney disease   . Tape Other (See Comments)    Welts result, if left for a long amount of time   Discharge Instructions    Diet renal [id number]    Complete by:  As directed    Discharge instructions    Complete by:  As directed    It is important that you read following instructions as well as go over your medication list with RN to help you understand your care after this hospitalization.  Discharge Instructions: Please follow-up with PCP in one week  Please request your primary care physician to go over all Hospital Tests and Procedure/Radiological results at the follow up,  Please get all Hospital records sent to your PCP by signing hospital release before you go home.   Do not take more than prescribed Pain, Sleep and Anxiety Medications. You were cared for by a hospitalist during your hospital stay. If you have any questions about your discharge medications or the care you received while you were in the hospital after you are discharged, you can call the unit and ask to speak with the hospitalist on call if the hospitalist that took care of you is not available.  Once you are discharged, your primary care physician will handle any further medical issues. Please note that NO REFILLS for any discharge medications will be authorized once you are discharged, as it is imperative that you return to your primary care physician (or establish a relationship with a primary care physician if you do not have one) for your aftercare needs so that they can reassess your need for medications and monitor your lab values. You Must read complete instructions/literature along with all the possible  adverse reactions/side effects for all the Medicines you take and that have been prescribed to you. Take any new Medicines after you have completely understood and accept all the possible adverse reactions/side effects. Wear Seat belts while driving. If you have smoked or chewed Tobacco in the last 2 yrs please stop smoking and/or stop any Recreational drug use.   Increase activity slowly    Complete by:  As directed      Discharge Exam: Filed Weights   10/14/16 0956 10/14/16  1409 10/15/16 0452  Weight: 123.2 kg (271 lb 9.7 oz) 117.2 kg (258 lb 6.1 oz) 118.5 kg (261 lb 3.2 oz)   Vitals:   10/15/16 0838 10/15/16 1218  BP: (!) 152/90 (!) 150/116  Pulse: 83 86  Resp:    Temp: 98 F (36.7 C) 98.5 F (36.9 C)  SpO2: 95% 99%   General: Appear in no distress, no Rash; Oral Mucosa moist. Cardiovascular: S1 and S2 Present, no Murmur, no JVD Respiratory: Bilateral Air entry present and Clear to Auscultation, no Crackles, no wheezes Abdomen: Bowel Sound present, Soft and no tenderness Extremities: no Pedal edema, no calf tenderness Neurology: Grossly no focal neuro deficit.  The results of significant diagnostics from this hospitalization (including imaging, microbiology, ancillary and laboratory) are listed below for reference.    Significant Diagnostic Studies: Dg Chest 2 View  Result Date: 10/13/2016 CLINICAL DATA:  Mid chest pain and shortness of breath. EXAM: CHEST  2 VIEW COMPARISON:  09/26/2016 FINDINGS: Stable postsurgical changes from CABG. Moderately enlarged cardiac silhouette. Mediastinal contours appear intact. There is no evidence of focal airspace consolidation, pleural effusion or pneumothorax. Osseous structures are without acute abnormality. Soft tissues are grossly normal. IMPRESSION: Moderately enlarged cardiac silhouette. No evidence of pulmonary consolidation. Electronically Signed   By: Ted Mcalpine M.D.   On: 10/13/2016 17:59   Ct Angio Chest Pe W Or Wo  Contrast  Result Date: 10/14/2016 CLINICAL DATA:  Acute onset of shortness of breath and generalized chest pain. Initial encounter. EXAM: CT ANGIOGRAPHY CHEST WITH CONTRAST TECHNIQUE: Multidetector CT imaging of the chest was performed using the standard protocol during bolus administration of intravenous contrast. Multiplanar CT image reconstructions and MIPs were obtained to evaluate the vascular anatomy. CONTRAST:  100 mL of Isovue 370 IV contrast COMPARISON:  Chest radiograph performed 10/13/2016, and CTA of the chest performed 05/16/2016 FINDINGS: Cardiovascular:  There is no evidence of pulmonary embolus. The heart is mildly enlarged. Scattered coronary artery calcifications are seen. The patient is status post median sternotomy. The great vessels are grossly unremarkable. Mediastinum/Nodes: Mildly enlarged right paratracheal nodes are seen, measuring up to 1.4 cm in short axis. A 1.3 cm azygoesophageal recess node is also noted. No pericardial effusion is identified. The visualized portions of the thyroid gland are unremarkable. No axillary lymphadenopathy is seen. Mild wall thickening along the distal esophagus could reflect chronic inflammation or possibly mild esophagitis. Lungs/Pleura: Minimal interstitial prominence is noted. The lungs are otherwise clear. There is no evidence of pleural effusion or pneumothorax. No masses are seen. Upper Abdomen: The visualized portions of the liver and spleen are unremarkable. The visualized portions of the gallbladder, pancreas and adrenal glands are within normal limits. Musculoskeletal: No acute osseous abnormalities are identified. The visualized musculature is unremarkable in appearance. Review of the MIP images confirms the above findings. IMPRESSION: 1. No evidence of pulmonary embolus. 2. Minimal interstitial prominence noted.  Lungs otherwise clear. 3. Prominent mediastinal nodes, measuring up to 1.4 cm in short axis. 4. Mild wall thickening along the  distal esophagus could reflect chronic inflammation or possibly mild esophagitis. Would correlate for any associated symptoms. 5. Scattered coronary artery calcifications. Electronically Signed   By: Roanna Raider M.D.   On: 10/14/2016 04:54   Dg Chest Port 1 View  Result Date: 09/26/2016 CLINICAL DATA:  Dyspnea this evening EXAM: PORTABLE CHEST 1 VIEW COMPARISON:  08/21/2016 FINDINGS: Cardiomegaly normal abdominal increased interstitial more edema and central vascular congestion. Low lung volumes with bibasilar atelectasis. Median sternotomy sutures  are in place. There is aortic atherosclerosis at the arch. No acute nor suspicious osseous abnormalities. IMPRESSION: Stable cardiomegaly with aortic atherosclerosis. Interval development of interstitial edema and mild CHF Electronically Signed   By: Tollie Eth M.D.   On: 09/26/2016 00:40    Microbiology: No results found for this or any previous visit (from the past 240 hour(s)).   Labs: CBC:  Recent Labs Lab 10/13/16 1649 10/14/16 0342 10/15/16 0246  WBC 7.5 7.4 6.8  NEUTROABS  --   --  4.4  HGB 11.3* 10.0* 10.8*  HCT 35.2* 31.6* 35.3*  MCV 87.6 88.0 90.5  PLT 260 234 241   Basic Metabolic Panel:  Recent Labs Lab 10/13/16 1649 10/14/16 0342 10/15/16 0246  NA 136 135 133*  K 4.1 4.1 4.2  CL 93* 93* 93*  CO2 31 28 27   GLUCOSE 343* 329* 228*  BUN 25* 33* 32*  CREATININE 5.47* 6.73* 6.41*  CALCIUM 9.2 9.1 9.1  MG  --  1.9  --   PHOS  --  6.5*  --    Liver Function Tests:  Recent Labs Lab 10/14/16 0342 10/15/16 0246  AST 21 20  ALT 17 18  ALKPHOS 81 83  BILITOT 0.7 0.6  PROT 6.5 6.7  ALBUMIN 3.3* 3.3*   No results for input(s): LIPASE, AMYLASE in the last 168 hours. No results for input(s): AMMONIA in the last 168 hours. Cardiac Enzymes:  Recent Labs Lab 10/13/16 2203 10/14/16 0342 10/14/16 0936  TROPONINI 0.03* 0.06* 0.06*   BNP (last 3 results)  Recent Labs  01/10/16 1834 05/16/16 1740  05/23/16 1558  BNP 182.8* 662.2* 299.0*   CBG:  Recent Labs Lab 10/14/16 0751 10/14/16 1619 10/14/16 2002 10/15/16 0732 10/15/16 1144  GLUCAP 291* 190* 371* 229* 267*   Time spent: 35 minutes  Signed:  Rosetta Rupnow  [facility] 10/15/2016 , 10:55 AM

## 2016-10-20 DIAGNOSIS — Z23 Encounter for immunization: Secondary | ICD-10-CM | POA: Diagnosis not present

## 2016-10-20 DIAGNOSIS — D509 Iron deficiency anemia, unspecified: Secondary | ICD-10-CM | POA: Diagnosis not present

## 2016-10-20 DIAGNOSIS — N186 End stage renal disease: Secondary | ICD-10-CM | POA: Diagnosis not present

## 2016-10-20 DIAGNOSIS — E1129 Type 2 diabetes mellitus with other diabetic kidney complication: Secondary | ICD-10-CM | POA: Diagnosis not present

## 2016-10-20 DIAGNOSIS — N2581 Secondary hyperparathyroidism of renal origin: Secondary | ICD-10-CM | POA: Diagnosis not present

## 2016-10-20 DIAGNOSIS — D631 Anemia in chronic kidney disease: Secondary | ICD-10-CM | POA: Diagnosis not present

## 2016-10-23 DIAGNOSIS — D509 Iron deficiency anemia, unspecified: Secondary | ICD-10-CM | POA: Diagnosis not present

## 2016-10-23 DIAGNOSIS — E1129 Type 2 diabetes mellitus with other diabetic kidney complication: Secondary | ICD-10-CM | POA: Diagnosis not present

## 2016-10-23 DIAGNOSIS — N186 End stage renal disease: Secondary | ICD-10-CM | POA: Diagnosis not present

## 2016-10-23 DIAGNOSIS — Z23 Encounter for immunization: Secondary | ICD-10-CM | POA: Diagnosis not present

## 2016-10-23 DIAGNOSIS — D631 Anemia in chronic kidney disease: Secondary | ICD-10-CM | POA: Diagnosis not present

## 2016-10-23 DIAGNOSIS — N2581 Secondary hyperparathyroidism of renal origin: Secondary | ICD-10-CM | POA: Diagnosis not present

## 2016-10-24 DIAGNOSIS — J45909 Unspecified asthma, uncomplicated: Secondary | ICD-10-CM | POA: Diagnosis not present

## 2016-10-24 DIAGNOSIS — F419 Anxiety disorder, unspecified: Secondary | ICD-10-CM | POA: Diagnosis not present

## 2016-10-24 DIAGNOSIS — Z951 Presence of aortocoronary bypass graft: Secondary | ICD-10-CM | POA: Diagnosis not present

## 2016-10-24 DIAGNOSIS — Z7982 Long term (current) use of aspirin: Secondary | ICD-10-CM | POA: Diagnosis not present

## 2016-10-24 DIAGNOSIS — E1151 Type 2 diabetes mellitus with diabetic peripheral angiopathy without gangrene: Secondary | ICD-10-CM | POA: Diagnosis not present

## 2016-10-24 DIAGNOSIS — I2510 Atherosclerotic heart disease of native coronary artery without angina pectoris: Secondary | ICD-10-CM | POA: Diagnosis not present

## 2016-10-24 DIAGNOSIS — E039 Hypothyroidism, unspecified: Secondary | ICD-10-CM | POA: Diagnosis not present

## 2016-10-24 DIAGNOSIS — I120 Hypertensive chronic kidney disease with stage 5 chronic kidney disease or end stage renal disease: Secondary | ICD-10-CM | POA: Diagnosis not present

## 2016-10-24 DIAGNOSIS — E1165 Type 2 diabetes mellitus with hyperglycemia: Secondary | ICD-10-CM | POA: Diagnosis not present

## 2016-10-24 DIAGNOSIS — E1122 Type 2 diabetes mellitus with diabetic chronic kidney disease: Secondary | ICD-10-CM | POA: Diagnosis not present

## 2016-10-24 DIAGNOSIS — D631 Anemia in chronic kidney disease: Secondary | ICD-10-CM | POA: Diagnosis not present

## 2016-10-24 DIAGNOSIS — Z7984 Long term (current) use of oral hypoglycemic drugs: Secondary | ICD-10-CM | POA: Diagnosis not present

## 2016-10-24 DIAGNOSIS — Z992 Dependence on renal dialysis: Secondary | ICD-10-CM | POA: Diagnosis not present

## 2016-10-24 DIAGNOSIS — N186 End stage renal disease: Secondary | ICD-10-CM | POA: Diagnosis not present

## 2016-10-24 DIAGNOSIS — Z86718 Personal history of other venous thrombosis and embolism: Secondary | ICD-10-CM | POA: Diagnosis not present

## 2016-10-25 DIAGNOSIS — N186 End stage renal disease: Secondary | ICD-10-CM | POA: Diagnosis not present

## 2016-10-25 DIAGNOSIS — Z23 Encounter for immunization: Secondary | ICD-10-CM | POA: Diagnosis not present

## 2016-10-25 DIAGNOSIS — E1129 Type 2 diabetes mellitus with other diabetic kidney complication: Secondary | ICD-10-CM | POA: Diagnosis not present

## 2016-10-25 DIAGNOSIS — N2581 Secondary hyperparathyroidism of renal origin: Secondary | ICD-10-CM | POA: Diagnosis not present

## 2016-10-25 DIAGNOSIS — D631 Anemia in chronic kidney disease: Secondary | ICD-10-CM | POA: Diagnosis not present

## 2016-10-25 DIAGNOSIS — D509 Iron deficiency anemia, unspecified: Secondary | ICD-10-CM | POA: Diagnosis not present

## 2016-10-26 ENCOUNTER — Telehealth: Payer: Self-pay | Admitting: Family Medicine

## 2016-10-26 NOTE — Telephone Encounter (Signed)
[facility] from [facility] is calling again to check on the status of a verbal order for pt.  He states he cannot go back out until he has another verbal order put in.  He also states he called in on Tuesday for the first request. Please advise Ryan at [phone]

## 2016-10-26 NOTE — Telephone Encounter (Signed)
Yes, please give order for [facility] PT.  Duplicate telephone note from today states: pt was in the hospital due to his diabetes and he received a referral from [place].Alycia Rossetti.Ryan stated that he needs an order put in for him to go out to pt home 1 day a week for a week, then 2 days a week for 3 weeks, then 1 day a week for 5 weeks.

## 2016-10-26 NOTE — Telephone Encounter (Signed)
Please Advise

## 2016-10-26 NOTE — Telephone Encounter (Signed)
[facility] from [facility] called stated that pt was in the hospital due to his diabetes and stated that he received a referral from [place].Alycia Rossetti.Ryan stated that he needs an order put in for him to go out to pt home 1 day a week for a week, then 2 days a week for 3 weeks, then 1 day a week for 5 weeks. Alycia RossettiRyan called back number is [phone]  Please advise

## 2016-10-27 DIAGNOSIS — N2581 Secondary hyperparathyroidism of renal origin: Secondary | ICD-10-CM | POA: Diagnosis not present

## 2016-10-27 DIAGNOSIS — E1129 Type 2 diabetes mellitus with other diabetic kidney complication: Secondary | ICD-10-CM | POA: Diagnosis not present

## 2016-10-27 DIAGNOSIS — N186 End stage renal disease: Secondary | ICD-10-CM | POA: Diagnosis not present

## 2016-10-27 DIAGNOSIS — D509 Iron deficiency anemia, unspecified: Secondary | ICD-10-CM | POA: Diagnosis not present

## 2016-10-27 DIAGNOSIS — Z23 Encounter for immunization: Secondary | ICD-10-CM | POA: Diagnosis not present

## 2016-10-27 DIAGNOSIS — D631 Anemia in chronic kidney disease: Secondary | ICD-10-CM | POA: Diagnosis not present

## 2016-10-27 NOTE — Telephone Encounter (Signed)
Done

## 2016-10-30 ENCOUNTER — Other Ambulatory Visit: Payer: Self-pay

## 2016-10-30 DIAGNOSIS — N186 End stage renal disease: Secondary | ICD-10-CM | POA: Diagnosis not present

## 2016-10-30 DIAGNOSIS — E1129 Type 2 diabetes mellitus with other diabetic kidney complication: Secondary | ICD-10-CM | POA: Diagnosis not present

## 2016-10-30 DIAGNOSIS — D509 Iron deficiency anemia, unspecified: Secondary | ICD-10-CM | POA: Diagnosis not present

## 2016-10-30 DIAGNOSIS — D631 Anemia in chronic kidney disease: Secondary | ICD-10-CM | POA: Diagnosis not present

## 2016-10-30 DIAGNOSIS — N2581 Secondary hyperparathyroidism of renal origin: Secondary | ICD-10-CM | POA: Diagnosis not present

## 2016-10-30 DIAGNOSIS — Z23 Encounter for immunization: Secondary | ICD-10-CM | POA: Diagnosis not present

## 2016-10-30 DIAGNOSIS — M79609 Pain in unspecified limb: Secondary | ICD-10-CM

## 2016-10-30 NOTE — Telephone Encounter (Signed)
Verbal order received on 10/26

## 2016-11-01 DIAGNOSIS — Z23 Encounter for immunization: Secondary | ICD-10-CM | POA: Diagnosis not present

## 2016-11-01 DIAGNOSIS — N049 Nephrotic syndrome with unspecified morphologic changes: Secondary | ICD-10-CM | POA: Diagnosis not present

## 2016-11-01 DIAGNOSIS — D631 Anemia in chronic kidney disease: Secondary | ICD-10-CM | POA: Diagnosis not present

## 2016-11-01 DIAGNOSIS — E1129 Type 2 diabetes mellitus with other diabetic kidney complication: Secondary | ICD-10-CM | POA: Diagnosis not present

## 2016-11-01 DIAGNOSIS — N186 End stage renal disease: Secondary | ICD-10-CM | POA: Diagnosis not present

## 2016-11-01 DIAGNOSIS — N2581 Secondary hyperparathyroidism of renal origin: Secondary | ICD-10-CM | POA: Diagnosis not present

## 2016-11-01 DIAGNOSIS — Z992 Dependence on renal dialysis: Secondary | ICD-10-CM | POA: Diagnosis not present

## 2016-11-01 DIAGNOSIS — D509 Iron deficiency anemia, unspecified: Secondary | ICD-10-CM | POA: Diagnosis not present

## 2016-11-03 DIAGNOSIS — N186 End stage renal disease: Secondary | ICD-10-CM | POA: Diagnosis not present

## 2016-11-03 DIAGNOSIS — E1129 Type 2 diabetes mellitus with other diabetic kidney complication: Secondary | ICD-10-CM | POA: Diagnosis not present

## 2016-11-03 DIAGNOSIS — D631 Anemia in chronic kidney disease: Secondary | ICD-10-CM | POA: Diagnosis not present

## 2016-11-03 DIAGNOSIS — N2581 Secondary hyperparathyroidism of renal origin: Secondary | ICD-10-CM | POA: Diagnosis not present

## 2016-11-03 DIAGNOSIS — D509 Iron deficiency anemia, unspecified: Secondary | ICD-10-CM | POA: Diagnosis not present

## 2016-11-06 ENCOUNTER — Telehealth: Payer: Self-pay

## 2016-11-06 ENCOUNTER — Telehealth: Payer: Self-pay | Admitting: Family Medicine

## 2016-11-06 DIAGNOSIS — E1129 Type 2 diabetes mellitus with other diabetic kidney complication: Secondary | ICD-10-CM | POA: Diagnosis not present

## 2016-11-06 DIAGNOSIS — D631 Anemia in chronic kidney disease: Secondary | ICD-10-CM | POA: Diagnosis not present

## 2016-11-06 DIAGNOSIS — N186 End stage renal disease: Secondary | ICD-10-CM | POA: Diagnosis not present

## 2016-11-06 DIAGNOSIS — N2581 Secondary hyperparathyroidism of renal origin: Secondary | ICD-10-CM | POA: Diagnosis not present

## 2016-11-06 DIAGNOSIS — D509 Iron deficiency anemia, unspecified: Secondary | ICD-10-CM | POA: Diagnosis not present

## 2016-11-06 DIAGNOSIS — M791 Myalgia, unspecified site: Secondary | ICD-10-CM

## 2016-11-06 NOTE — Telephone Encounter (Signed)
Kathlene NovemberMike the OT with [facility] would like either Dr Clelia CroftShaw or her nurse to call him back at [phone] to get orders for this patient to have OT at home.

## 2016-11-06 NOTE — Telephone Encounter (Signed)
Pharmacy sent request for new Rx to be sent to [facility] [address] for patients Gabapentin and flexeril. Pt last seen 08/21/2016. Please advise.

## 2016-11-07 ENCOUNTER — Ambulatory Visit: Payer: Medicare Other | Admitting: Podiatry

## 2016-11-07 MED ORDER — CYCLOBENZAPRINE HCL 10 MG PO TABS: 10.0000 mg | ORAL_TABLET | Freq: Two times a day (BID) | ORAL | 0 refills | Status: DC | PRN

## 2016-11-07 MED ORDER — GABAPENTIN 100 MG PO CAPS: 200.0000 mg | ORAL_CAPSULE | Freq: Every day | ORAL | 0 refills | Status: DC

## 2016-11-07 NOTE — Telephone Encounter (Signed)
Needs OV -  Gabapentin was prescribed 200mg  qhs but then it states he is taking it q4 hrs Has been 18mos since was rx'd flexeril by Dr. Milus GlazierLauenstein. I do not know how he is taking either of these medicines.  Will sent in 1 mo supply to give pt time to schedule

## 2016-11-07 NOTE — Telephone Encounter (Signed)
Verbal given 

## 2016-11-08 DIAGNOSIS — D509 Iron deficiency anemia, unspecified: Secondary | ICD-10-CM | POA: Diagnosis not present

## 2016-11-08 DIAGNOSIS — D631 Anemia in chronic kidney disease: Secondary | ICD-10-CM | POA: Diagnosis not present

## 2016-11-08 DIAGNOSIS — N2581 Secondary hyperparathyroidism of renal origin: Secondary | ICD-10-CM | POA: Diagnosis not present

## 2016-11-08 DIAGNOSIS — E1129 Type 2 diabetes mellitus with other diabetic kidney complication: Secondary | ICD-10-CM | POA: Diagnosis not present

## 2016-11-08 DIAGNOSIS — N186 End stage renal disease: Secondary | ICD-10-CM | POA: Diagnosis not present

## 2016-11-10 DIAGNOSIS — E1129 Type 2 diabetes mellitus with other diabetic kidney complication: Secondary | ICD-10-CM | POA: Diagnosis not present

## 2016-11-10 DIAGNOSIS — D631 Anemia in chronic kidney disease: Secondary | ICD-10-CM | POA: Diagnosis not present

## 2016-11-10 DIAGNOSIS — N2581 Secondary hyperparathyroidism of renal origin: Secondary | ICD-10-CM | POA: Diagnosis not present

## 2016-11-10 DIAGNOSIS — D509 Iron deficiency anemia, unspecified: Secondary | ICD-10-CM | POA: Diagnosis not present

## 2016-11-10 DIAGNOSIS — N186 End stage renal disease: Secondary | ICD-10-CM | POA: Diagnosis not present

## 2016-11-13 DIAGNOSIS — E1129 Type 2 diabetes mellitus with other diabetic kidney complication: Secondary | ICD-10-CM | POA: Diagnosis not present

## 2016-11-13 DIAGNOSIS — N2581 Secondary hyperparathyroidism of renal origin: Secondary | ICD-10-CM | POA: Diagnosis not present

## 2016-11-13 DIAGNOSIS — N186 End stage renal disease: Secondary | ICD-10-CM | POA: Diagnosis not present

## 2016-11-13 DIAGNOSIS — D631 Anemia in chronic kidney disease: Secondary | ICD-10-CM | POA: Diagnosis not present

## 2016-11-13 DIAGNOSIS — D509 Iron deficiency anemia, unspecified: Secondary | ICD-10-CM | POA: Diagnosis not present

## 2016-11-14 DIAGNOSIS — M25552 Pain in left hip: Secondary | ICD-10-CM | POA: Diagnosis not present

## 2016-11-15 ENCOUNTER — Encounter: Payer: Self-pay | Admitting: *Deleted

## 2016-11-15 DIAGNOSIS — D509 Iron deficiency anemia, unspecified: Secondary | ICD-10-CM | POA: Diagnosis not present

## 2016-11-15 DIAGNOSIS — D631 Anemia in chronic kidney disease: Secondary | ICD-10-CM | POA: Diagnosis not present

## 2016-11-15 DIAGNOSIS — N186 End stage renal disease: Secondary | ICD-10-CM | POA: Diagnosis not present

## 2016-11-15 DIAGNOSIS — E1129 Type 2 diabetes mellitus with other diabetic kidney complication: Secondary | ICD-10-CM | POA: Diagnosis not present

## 2016-11-15 DIAGNOSIS — N2581 Secondary hyperparathyroidism of renal origin: Secondary | ICD-10-CM | POA: Diagnosis not present

## 2016-11-15 NOTE — Progress Notes (Deleted)
HPI: FU CAD. Renal Dopplers May 2016 normal. Carotid Dopplers September 2017 showed 1-39% bilateral stenosis. Patient had coronary artery bypass and graft 9/17 with a LIMA to LAD, saphenous vein graft to the diagonal and saphenous vein graft to the obtuse marginal. Echocardiogram October 2018 showed normal LV function and mild left atrial enlargement. Cardiac catheterization September 2018 showed 99%ramus, 80% LAD, 90% first diagonal. LIMA to LAD, saphenous vein graft to the diagonal and saphenous vein graft to the ramus all patent. Circumflex and right coronary artery patent. Medical therapy recommended. CTA October 2018 showed no pulmonary embolus. Since last seen   Current Outpatient Medications  Medication Sig Dispense Refill  . acetaminophen (TYLENOL) 500 MG tablet Take 1 tablet (500 mg total) by mouth every 6 (six) hours as needed for moderate pain. (Patient taking differently: Take 1,000 mg by mouth every 6 (six) hours as needed (for pain). ) 30 tablet 0  . albuterol (PROVENTIL HFA;VENTOLIN HFA) 108 (90 Base) MCG/ACT inhaler Inhale 1 puff into the lungs every 6 (six) hours as needed for wheezing or shortness of breath. (Patient taking differently: Inhale 2 puffs into the lungs every 8 (eight) hours as needed for wheezing or shortness of breath. ) 1 Inhaler 11  . albuterol (PROVENTIL) (2.5 MG/3ML) 0.083% nebulizer solution Take 3 mLs (2.5 mg total) by nebulization every 4 (four) hours as needed for wheezing. 150 mL 2  . albuterol-ipratropium (COMBIVENT) 18-103 MCG/ACT inhaler Inhale 2 puffs into the lungs every 4 (four) hours. (Patient taking differently: Inhale 2 puffs into the lungs 3 (three) times daily as needed for wheezing or shortness of breath. ) 1 Inhaler 11  . amLODipine (NORVASC) 5 MG tablet Take 1 tablet (5 mg total) by mouth as directed. Take twice a day on dialysis days and once a day on other days. 45 tablet 0  . aspirin EC 81 MG EC tablet Take 1 tablet (81 mg total) by mouth  daily.    Marland Kitchen. atorvastatin (LIPITOR) 80 MG tablet Take 1 tablet (80 mg total) by mouth daily at 6 PM. 90 tablet 1  . calcitRIOL (ROCALTROL) 0.25 MCG capsule Take 1 capsule (0.25 mcg total) by mouth every Monday, Wednesday, and Friday with hemodialysis. (Patient taking differently: Take 0.25 mcg by mouth See admin instructions. Tuesday Thursday Saturday) 30 capsule 3  . calcium acetate (PHOSLO) 667 MG capsule Take 2 capsules (1,334 mg total) by mouth 3 (three) times daily with meals. (Patient taking differently: Take 1,334-2,001 mg by mouth See admin instructions. Take 2001 mg by mouth 3 times daily with meals and take 1334 mg by mouth with snacks) 180 capsule 0  . cinacalcet (SENSIPAR) 30 MG tablet Take 30 mg by mouth every Monday, Wednesday, and Friday.    . citalopram (CELEXA) 40 MG tablet Take 40 mg by mouth at bedtime.     . clonazePAM (KLONOPIN) 2 MG tablet Take 2 mg by mouth daily.    . clopidogrel (PLAVIX) 75 MG tablet Take 1 tablet (75 mg total) by mouth daily. (Patient taking differently: Take 75 mg by mouth at bedtime. ) 90 tablet 3  . cyclobenzaprine (FLEXERIL) 10 MG tablet Take 1 tablet (10 mg total) 3 times/day as needed-between meals & bedtime by mouth for muscle spasms. 30 tablet 0  . gabapentin (NEURONTIN) 100 MG capsule Take 2 capsules (200 mg total) at bedtime by mouth. 60 capsule 0  . hydrALAZINE (APRESOLINE) 25 MG tablet Take 1 tablet (25 mg total) by mouth 3 (three)  times daily as needed (take for blood pressure above 160 if not controlled with home meds). 30 tablet 0  . HYDROcodone-acetaminophen (NORCO/VICODIN) 5-325 MG tablet Take 1-2 tablets by mouth every 4 (four) hours as needed for moderate pain. 60 tablet 0  . HYDROmorphone (DILAUDID) 2 MG tablet Take 2 mg by mouth 3 (three) times daily.    . hydrOXYzine (ATARAX/VISTARIL) 25 MG tablet Take 25 mg by mouth 2 (two) times daily as needed for anxiety.     . insulin glargine (LANTUS) 100 UNIT/ML injection Inject 0.4 mLs (40 Units  total) into the skin 2 (two) times daily. (Patient taking differently: Inject 30 Units into the skin daily. ) 10 mL 0  . insulin lispro (HUMALOG) 100 UNIT/ML injection Inject 0.15-0.3 mLs (15-30 Units total) into the skin every evening. Take one unit per 5 carbs depending on dinner. 10 mL 2  . levothyroxine (SYNTHROID, LEVOTHROID) 75 MCG tablet Take 1 tablet (75 mcg total) by mouth daily before breakfast. 30 tablet 0  . metoCLOPramide (REGLAN) 5 MG tablet Take 1 tablet (5 mg total) by mouth every 8 (eight) hours as needed for nausea or vomiting. (Patient taking differently: Take 10 mg by mouth 3 (three) times daily. ) 30 tablet 0  . metoprolol tartrate (LOPRESSOR) 100 MG tablet Take 1 tab twice a day on dialysis days and once a day on other days. 45 tablet 0   No current facility-administered medications for this visit.      Past Medical History:  Diagnosis Date  . Anxiety   . Asthma   . 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 Daily headache   . Depression   . DVT (deep venous thrombosis), H/o 01/2014-on Xarelto 03/24/2014   LLE  . ESRD (end stage renal disease) on dialysis [facility](HCC)    "[place], MWF" (06/29/2016)  . Hypertension   . Hypothyroidism   . Nephrotic syndrome 05/18/2014  . Type 2 diabetes mellitus with diabetic nephropathy [facility](HCC)     Past Surgical History:  Procedure Laterality Date  . ANKLE FRACTURE SURGERY Right 1988  . EYE SURGERY    . FRACTURE SURGERY    . RETINAL LASER PROCEDURE Bilateral   . TONSILLECTOMY AND ADENOIDECTOMY  1970s    Social History   Socioeconomic History  . Marital status: Single    Spouse name: Not on file  . Number of children: Not on file  . Years of education: Not on file  . Highest education level: Not on file  Social Needs  . Financial resource strain: Not on file  . Food insecurity - worry: Not  on file  . Food insecurity - inability: Not on file  . Transportation needs - medical: Not on file  . Transportation needs - non-medical: Not on file  Occupational History    Comment: Data processing managerMaintenance mechanic  Tobacco Use  . Smoking status: Never Smoker  . Smokeless tobacco: Never Used  Substance and Sexual Activity  . Alcohol use: Yes    Alcohol/week: 0.0 oz    Comment: 06/29/2016 "might have a few drinks/year"  . Drug use: No  . Sexual activity: Yes  Other Topics Concern  . Not on file  Social History Narrative   Patient currently lives with his fiance   Works with facilities   Nonsmoker, nondrinker    Family History  Problem Relation Age of  Onset  . Obesity Mother        Patient states that family members have no other medical illnesses other than what I have described  . Kidney cancer Maternal Grandmother   . Cancer Father 50       AML  . Heart disease Unknown        No family history    ROS: no fevers or chills, productive cough, hemoptysis, dysphasia, odynophagia, melena, hematochezia, dysuria, hematuria, rash, seizure activity, orthopnea, PND, pedal edema, claudication. Remaining systems are negative.  Physical Exam: Well-developed well-nourished in no acute distress.  Skin is warm and dry.  HEENT is normal.  Neck is supple.  Chest is clear to auscultation with normal expansion.  Cardiovascular exam is regular rate and rhythm.  Abdominal exam nontender or distended. No masses palpated. Extremities show no edema. neuro grossly intact  ECG- personally reviewed  A/P  1  Olga Millers, MD

## 2016-11-16 ENCOUNTER — Other Ambulatory Visit: Payer: Self-pay

## 2016-11-16 ENCOUNTER — Ambulatory Visit (INDEPENDENT_AMBULATORY_CARE_PROVIDER_SITE_OTHER): Payer: Medicare Other | Admitting: Vascular Surgery

## 2016-11-16 ENCOUNTER — Ambulatory Visit (HOSPITAL_COMMUNITY)
Admission: RE | Admit: 2016-11-16 | Discharge: 2016-11-16 | Disposition: A | Discharge status: Home / Self Care | Payer: Medicare Other | Source: Ambulatory Visit | Attending: Vascular Surgery | Admitting: Vascular Surgery

## 2016-11-16 ENCOUNTER — Encounter: Payer: Self-pay | Admitting: Vascular Surgery

## 2016-11-16 VITALS — BP 173/89 | HR 82 | Temp 98.1°F | Resp 18 | Ht 74.0 in | Wt 270.0 lb

## 2016-11-16 DIAGNOSIS — L97909 Non-pressure chronic ulcer of unspecified part of unspecified lower leg with unspecified severity: Secondary | ICD-10-CM

## 2016-11-16 DIAGNOSIS — I70299 Other atherosclerosis of native arteries of extremities, unspecified extremity: Secondary | ICD-10-CM

## 2016-11-16 DIAGNOSIS — I10 Essential (primary) hypertension: Secondary | ICD-10-CM | POA: Insufficient documentation

## 2016-11-16 DIAGNOSIS — E1151 Type 2 diabetes mellitus with diabetic peripheral angiopathy without gangrene: Secondary | ICD-10-CM | POA: Insufficient documentation

## 2016-11-16 DIAGNOSIS — I200 Unstable angina: Secondary | ICD-10-CM | POA: Diagnosis not present

## 2016-11-16 DIAGNOSIS — M79609 Pain in unspecified limb: Secondary | ICD-10-CM | POA: Insufficient documentation

## 2016-11-16 NOTE — Progress Notes (Signed)
Patient name: Russell [name] Silos MRN: [id number] DOB: July 14, 1970 Sex: male   REASON FOR CONSULT:    Discoloration of the right foot.  The consult is requested by Alonna Bucklerita Brown PA  HPI:   Russell [name] Padron is a pleasant 46 y.o. male, who states that he has had some swelling in the right foot for approximately 3 months.  This came on gradually.  He also noted some discoloration between his right first and second toes.  There was some bluish discoloration here.  He denies any history of claudication.  He does describe some calf pain but this occurs also with sitting and standing.  He denies any history of rest pain or nonhealing ulcers.  The patient had coronary revascularization last year with vein taken from both legs.  3 years ago he had he had a DVT in the right leg which was treated with anticoagulation but he is no longer on this.  I have reviewed the records that were sent from [facility].  The patient has end-stage renal disease and is on dialysis.  He dialyzes on Monday Wednesdays and Fridays.  He has a functioning left forearm AV fistula.  Past Medical History:  Diagnosis Date  . Anxiety   . Asthma   . 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 Daily headache   . Depression   . DVT (deep venous thrombosis), H/o 01/2014-on Xarelto 03/24/2014   LLE  . ESRD (end stage renal disease) on dialysis [facility](HCC)    "[place], MWF" (06/29/2016)  . Hypertension   . Hypothyroidism   . Nephrotic syndrome 05/18/2014  . Type 2 diabetes mellitus with diabetic nephropathy (HCC)     Family History  Problem Relation Age of Onset  . Obesity Mother        Patient states that family members have no other medical illnesses other than what I have described  . Kidney cancer Maternal Grandmother   . Cancer Father 50       AML  . Heart disease Unknown          No family history    SOCIAL HISTORY: Social History   Socioeconomic History  . Marital status: Single    Spouse name: Not on file  . Number of children: Not on file  . Years of education: Not on file  . Highest education level: Not on file  Social Needs  . Financial resource strain: Not on file  . Food insecurity - worry: Not on file  . Food insecurity - inability: Not on file  . Transportation needs - medical: Not on file  . Transportation needs - non-medical: Not on file  Occupational History    Comment: Data processing managerMaintenance mechanic  Tobacco Use  . Smoking status: Never Smoker  . Smokeless tobacco: Never Used  Substance and Sexual Activity  . Alcohol use: Yes    Alcohol/week: 0.0 oz    Comment: 06/29/2016 "might have a few drinks/year"  . Drug use: No  . Sexual activity: Yes  Other Topics Concern  . Not on file  Social History Narrative   Patient currently lives with his fiance   Works with facilities   Nonsmoker, nondrinker    Allergies  Allergen Reactions  . Ibuprofen Other (See Comments)    MD told patient not to take due to kidney disease  . Nsaids  Other (See Comments)    Told to avoid all nsaids due to kidney disease   . Tape Other (See Comments)    Welts result, if left for a long amount of time    Current Outpatient Medications  Medication Sig Dispense Refill  . acetaminophen (TYLENOL) 500 MG tablet Take 1 tablet (500 mg total) by mouth every 6 (six) hours as needed for moderate pain. (Patient taking differently: Take 1,000 mg by mouth every 6 (six) hours as needed (for pain). ) 30 tablet 0  . albuterol (PROVENTIL HFA;VENTOLIN HFA) 108 (90 Base) MCG/ACT inhaler Inhale 1 puff into the lungs every 6 (six) hours as needed for wheezing or shortness of breath. (Patient taking differently: Inhale 2 puffs into the lungs every 8 (eight) hours as needed for wheezing or shortness of breath. ) 1 Inhaler 11  . albuterol (PROVENTIL) (2.5 MG/3ML) 0.083% nebulizer solution  Take 3 mLs (2.5 mg total) by nebulization every 4 (four) hours as needed for wheezing. 150 mL 2  . albuterol-ipratropium (COMBIVENT) 18-103 MCG/ACT inhaler Inhale 2 puffs into the lungs every 4 (four) hours. (Patient taking differently: Inhale 2 puffs into the lungs 3 (three) times daily as needed for wheezing or shortness of breath. ) 1 Inhaler 11  . amLODipine (NORVASC) 5 MG tablet Take 1 tablet (5 mg total) by mouth as directed. Take twice a day on dialysis days and once a day on other days. 45 tablet 0  . aspirin EC 81 MG EC tablet Take 1 tablet (81 mg total) by mouth daily.    Marland Kitchen. atorvastatin (LIPITOR) 80 MG tablet Take 1 tablet (80 mg total) by mouth daily at 6 PM. 90 tablet 1  . calcitRIOL (ROCALTROL) 0.25 MCG capsule Take 1 capsule (0.25 mcg total) by mouth every Monday, Wednesday, and Friday with hemodialysis. (Patient taking differently: Take 0.25 mcg by mouth See admin instructions. Tuesday Thursday Saturday) 30 capsule 3  . calcium acetate (PHOSLO) 667 MG capsule Take 2 capsules (1,334 mg total) by mouth 3 (three) times daily with meals. (Patient taking differently: Take 1,334-2,001 mg by mouth See admin instructions. Take 2001 mg by mouth 3 times daily with meals and take 1334 mg by mouth with snacks) 180 capsule 0  . cinacalcet (SENSIPAR) 30 MG tablet Take 30 mg by mouth every Monday, Wednesday, and Friday.    . citalopram (CELEXA) 40 MG tablet Take 40 mg by mouth at bedtime.     . clonazePAM (KLONOPIN) 2 MG tablet Take 2 mg by mouth daily.    . clopidogrel (PLAVIX) 75 MG tablet Take 1 tablet (75 mg total) by mouth daily. (Patient taking differently: Take 75 mg by mouth at bedtime. ) 90 tablet 3  . cyclobenzaprine (FLEXERIL) 10 MG tablet Take 1 tablet (10 mg total) 3 times/day as needed-between meals & bedtime by mouth for muscle spasms. 30 tablet 0  . gabapentin (NEURONTIN) 100 MG capsule Take 2 capsules (200 mg total) at bedtime by mouth. 60 capsule 0  . hydrALAZINE (APRESOLINE) 25 MG  tablet Take 1 tablet (25 mg total) by mouth 3 (three) times daily as needed (take for blood pressure above 160 if not controlled with home meds). 30 tablet 0  . HYDROcodone-acetaminophen (NORCO/VICODIN) 5-325 MG tablet Take 1-2 tablets by mouth every 4 (four) hours as needed for moderate pain. 60 tablet 0  . HYDROmorphone (DILAUDID) 2 MG tablet Take 2 mg by mouth 3 (three) times daily.    . hydrOXYzine (ATARAX/VISTARIL) 25 MG tablet Take 25  mg by mouth 2 (two) times daily as needed for anxiety.     . insulin glargine (LANTUS) 100 UNIT/ML injection Inject 0.4 mLs (40 Units total) into the skin 2 (two) times daily. (Patient taking differently: Inject 30 Units into the skin daily. ) 10 mL 0  . insulin lispro (HUMALOG) 100 UNIT/ML injection Inject 0.15-0.3 mLs (15-30 Units total) into the skin every evening. Take one unit per 5 carbs depending on dinner. 10 mL 2  . levothyroxine (SYNTHROID, LEVOTHROID) 75 MCG tablet Take 1 tablet (75 mcg total) by mouth daily before breakfast. 30 tablet 0  . metoCLOPramide (REGLAN) 5 MG tablet Take 1 tablet (5 mg total) by mouth every 8 (eight) hours as needed for nausea or vomiting. (Patient taking differently: Take 10 mg by mouth 3 (three) times daily. ) 30 tablet 0  . metoprolol tartrate (LOPRESSOR) 100 MG tablet Take 1 tab twice a day on dialysis days and once a day on other days. 45 tablet 0   No current facility-administered medications for this visit.     REVIEW OF SYSTEMS:  [X]  denotes positive finding, [ ]  denotes negative finding Cardiac  Comments:  Chest pain or chest pressure: X   Shortness of breath upon exertion: X   Short of breath when lying flat:    Irregular heart rhythm:        Vascular    Pain in calf, thigh, or hip brought on by ambulation: X   Pain in feet at night that wakes you up from your sleep:  X   Blood clot in your veins:    Leg swelling:  X       Pulmonary    Oxygen at home:    Productive cough:     Wheezing:  X         Neurologic    Sudden weakness in arms or legs:  X   Sudden numbness in arms or legs:  X   Sudden onset of difficulty speaking or slurred speech:    Temporary loss of vision in one eye:     Problems with dizziness:         Gastrointestinal    Blood in stool:     Vomited blood:         Genitourinary    Burning when urinating:     Blood in urine:        Psychiatric    Major depression:         Hematologic    Bleeding problems:    Problems with blood clotting too easily:        Skin    Rashes or ulcers:        Constitutional    Fever or chills:     PHYSICAL EXAM:   Vitals:   11/16/16 0904 11/16/16 0911  BP: (!) 178/91 (!) 173/89  Pulse: 84 82  Resp: 18   Temp: 98.1 F (36.7 C)   TempSrc: Oral   SpO2: 98%   Weight: 270 lb (122.5 kg)   Height: 6\' 2"  (1.88 m)     GENERAL: The patient is a well-nourished male, in no acute distress. The vital signs are documented above. CARDIAC: There is a regular rate and rhythm.  VASCULAR: I do not detect carotid bruits. He has an excellent thrill in his left radiocephalic AV fistula. He has palpable femoral, popliteal, and dorsalis pedis pulses bilaterally. I cannot palpate posterior tibial pulses because he has some lower extremity swelling bilaterally. He has  some telangiectasias in both lower extremities and some mild hyperpigmentation consistent with chronic venous insufficiency. PULMONARY: There is good air exchange bilaterally without wheezing or rales. ABDOMEN: Soft and non-tender with normal pitched bowel sounds.  It is difficult to assess for aneurysm because of his size. MUSCULOSKELETAL: There are no major deformities or cyanosis. NEUROLOGIC: No focal weakness or paresthesias are detected. SKIN: There are no ulcers or rashes noted. PSYCHIATRIC: The patient has a normal affect.  DATA:    ARTERIAL DOPPLER STUDY: I have independently interpreted his arterial Doppler study.  On the right side there is a triphasic  dorsalis pedis signal and triphasic posterior tibial signal.  The arteries are not compressible nor are the digits compressible.  On the left side, there is a triphasic dorsalis pedis and posterior tibial signal.  The arteries are not compressible nor are the digits compressible.   MEDICAL ISSUES:   LEG SWELLING WITH SOME BLUISH DISCOLORATION BETWEEN THE RIGHT FIRST AND SECOND TOES: I reassured the patient that he has no evidence of significant arterial insufficiency.  He has palpable dorsalis pedis pulses bilaterally and his noninvasive study shows triphasic Doppler signals in the dorsalis pedis and posterior tibial positions bilaterally.  In addition he does not have symptoms consistent with arterial insufficiency.  He does have some leg swelling and hyperpigmentation in addition to the telangiectasias consistent with chronic venous insufficiency.  We have discussed the importance of intermittent leg elevation.  With respect to his leg pain he may have some neuropathy and he is on Neurontin.  HYPERTENSION: The patient's initial blood pressure today was elevated. We repeated this and this was still elevated. We have encouraged the patient to follow up with their primary care physician for management of their blood pressure.   Waverly Ferrari [facility] [phone]

## 2016-11-16 NOTE — Progress Notes (Signed)
Vitals:   11/16/16 0904  BP: (!) 178/91  Pulse: 84  Resp: 18  Temp: 98.1 F (36.7 C)  TempSrc: Oral  SpO2: 98%  Weight: 270 lb (122.5 kg)  Height: 6\' 2"  (1.88 m)

## 2016-11-17 DIAGNOSIS — N2581 Secondary hyperparathyroidism of renal origin: Secondary | ICD-10-CM | POA: Diagnosis not present

## 2016-11-17 DIAGNOSIS — N186 End stage renal disease: Secondary | ICD-10-CM | POA: Diagnosis not present

## 2016-11-17 DIAGNOSIS — D631 Anemia in chronic kidney disease: Secondary | ICD-10-CM | POA: Diagnosis not present

## 2016-11-17 DIAGNOSIS — D509 Iron deficiency anemia, unspecified: Secondary | ICD-10-CM | POA: Diagnosis not present

## 2016-11-17 DIAGNOSIS — E1129 Type 2 diabetes mellitus with other diabetic kidney complication: Secondary | ICD-10-CM | POA: Diagnosis not present

## 2016-11-20 DIAGNOSIS — N2581 Secondary hyperparathyroidism of renal origin: Secondary | ICD-10-CM | POA: Diagnosis not present

## 2016-11-20 DIAGNOSIS — E1129 Type 2 diabetes mellitus with other diabetic kidney complication: Secondary | ICD-10-CM | POA: Diagnosis not present

## 2016-11-20 DIAGNOSIS — D509 Iron deficiency anemia, unspecified: Secondary | ICD-10-CM | POA: Diagnosis not present

## 2016-11-20 DIAGNOSIS — N186 End stage renal disease: Secondary | ICD-10-CM | POA: Diagnosis not present

## 2016-11-20 DIAGNOSIS — D631 Anemia in chronic kidney disease: Secondary | ICD-10-CM | POA: Diagnosis not present

## 2016-11-22 DIAGNOSIS — D631 Anemia in chronic kidney disease: Secondary | ICD-10-CM | POA: Diagnosis not present

## 2016-11-22 DIAGNOSIS — N2581 Secondary hyperparathyroidism of renal origin: Secondary | ICD-10-CM | POA: Diagnosis not present

## 2016-11-22 DIAGNOSIS — N186 End stage renal disease: Secondary | ICD-10-CM | POA: Diagnosis not present

## 2016-11-22 DIAGNOSIS — E1129 Type 2 diabetes mellitus with other diabetic kidney complication: Secondary | ICD-10-CM | POA: Diagnosis not present

## 2016-11-22 DIAGNOSIS — D509 Iron deficiency anemia, unspecified: Secondary | ICD-10-CM | POA: Diagnosis not present

## 2016-11-24 DIAGNOSIS — N2581 Secondary hyperparathyroidism of renal origin: Secondary | ICD-10-CM | POA: Diagnosis not present

## 2016-11-24 DIAGNOSIS — N186 End stage renal disease: Secondary | ICD-10-CM | POA: Diagnosis not present

## 2016-11-24 DIAGNOSIS — E1129 Type 2 diabetes mellitus with other diabetic kidney complication: Secondary | ICD-10-CM | POA: Diagnosis not present

## 2016-11-24 DIAGNOSIS — D631 Anemia in chronic kidney disease: Secondary | ICD-10-CM | POA: Diagnosis not present

## 2016-11-24 DIAGNOSIS — D509 Iron deficiency anemia, unspecified: Secondary | ICD-10-CM | POA: Diagnosis not present

## 2016-11-27 DIAGNOSIS — N186 End stage renal disease: Secondary | ICD-10-CM | POA: Diagnosis not present

## 2016-11-27 DIAGNOSIS — D509 Iron deficiency anemia, unspecified: Secondary | ICD-10-CM | POA: Diagnosis not present

## 2016-11-27 DIAGNOSIS — D631 Anemia in chronic kidney disease: Secondary | ICD-10-CM | POA: Diagnosis not present

## 2016-11-27 DIAGNOSIS — N2581 Secondary hyperparathyroidism of renal origin: Secondary | ICD-10-CM | POA: Diagnosis not present

## 2016-11-27 DIAGNOSIS — E1129 Type 2 diabetes mellitus with other diabetic kidney complication: Secondary | ICD-10-CM | POA: Diagnosis not present

## 2016-11-28 ENCOUNTER — Ambulatory Visit: Payer: Medicare Other | Admitting: Cardiology

## 2016-11-29 ENCOUNTER — Other Ambulatory Visit: Payer: Self-pay | Admitting: Orthopedic Surgery

## 2016-11-29 DIAGNOSIS — M545 Low back pain: Secondary | ICD-10-CM

## 2016-11-29 DIAGNOSIS — D631 Anemia in chronic kidney disease: Secondary | ICD-10-CM | POA: Diagnosis not present

## 2016-11-29 DIAGNOSIS — N186 End stage renal disease: Secondary | ICD-10-CM | POA: Diagnosis not present

## 2016-11-29 DIAGNOSIS — D509 Iron deficiency anemia, unspecified: Secondary | ICD-10-CM | POA: Diagnosis not present

## 2016-11-29 DIAGNOSIS — N2581 Secondary hyperparathyroidism of renal origin: Secondary | ICD-10-CM | POA: Diagnosis not present

## 2016-11-29 DIAGNOSIS — E1129 Type 2 diabetes mellitus with other diabetic kidney complication: Secondary | ICD-10-CM | POA: Diagnosis not present

## 2016-12-01 DIAGNOSIS — E1129 Type 2 diabetes mellitus with other diabetic kidney complication: Secondary | ICD-10-CM | POA: Diagnosis not present

## 2016-12-01 DIAGNOSIS — N049 Nephrotic syndrome with unspecified morphologic changes: Secondary | ICD-10-CM | POA: Diagnosis not present

## 2016-12-01 DIAGNOSIS — N186 End stage renal disease: Secondary | ICD-10-CM | POA: Diagnosis not present

## 2016-12-01 DIAGNOSIS — D509 Iron deficiency anemia, unspecified: Secondary | ICD-10-CM | POA: Diagnosis not present

## 2016-12-01 DIAGNOSIS — D631 Anemia in chronic kidney disease: Secondary | ICD-10-CM | POA: Diagnosis not present

## 2016-12-01 DIAGNOSIS — N2581 Secondary hyperparathyroidism of renal origin: Secondary | ICD-10-CM | POA: Diagnosis not present

## 2016-12-01 DIAGNOSIS — Z992 Dependence on renal dialysis: Secondary | ICD-10-CM | POA: Diagnosis not present

## 2016-12-04 DIAGNOSIS — D631 Anemia in chronic kidney disease: Secondary | ICD-10-CM | POA: Diagnosis not present

## 2016-12-04 DIAGNOSIS — D509 Iron deficiency anemia, unspecified: Secondary | ICD-10-CM | POA: Diagnosis not present

## 2016-12-04 DIAGNOSIS — N2581 Secondary hyperparathyroidism of renal origin: Secondary | ICD-10-CM | POA: Diagnosis not present

## 2016-12-04 DIAGNOSIS — E875 Hyperkalemia: Secondary | ICD-10-CM | POA: Diagnosis not present

## 2016-12-04 DIAGNOSIS — N186 End stage renal disease: Secondary | ICD-10-CM | POA: Diagnosis not present

## 2016-12-04 DIAGNOSIS — E1129 Type 2 diabetes mellitus with other diabetic kidney complication: Secondary | ICD-10-CM | POA: Diagnosis not present

## 2016-12-05 DIAGNOSIS — N186 End stage renal disease: Secondary | ICD-10-CM | POA: Diagnosis not present

## 2016-12-05 DIAGNOSIS — Z992 Dependence on renal dialysis: Secondary | ICD-10-CM | POA: Diagnosis not present

## 2016-12-05 DIAGNOSIS — I871 Compression of vein: Secondary | ICD-10-CM | POA: Diagnosis not present

## 2016-12-05 DIAGNOSIS — T82868A Thrombosis of vascular prosthetic devices, implants and grafts, initial encounter: Secondary | ICD-10-CM | POA: Diagnosis not present

## 2016-12-06 DIAGNOSIS — D631 Anemia in chronic kidney disease: Secondary | ICD-10-CM | POA: Diagnosis not present

## 2016-12-06 DIAGNOSIS — N186 End stage renal disease: Secondary | ICD-10-CM | POA: Diagnosis not present

## 2016-12-06 DIAGNOSIS — N2581 Secondary hyperparathyroidism of renal origin: Secondary | ICD-10-CM | POA: Diagnosis not present

## 2016-12-06 DIAGNOSIS — E1129 Type 2 diabetes mellitus with other diabetic kidney complication: Secondary | ICD-10-CM | POA: Diagnosis not present

## 2016-12-06 DIAGNOSIS — D509 Iron deficiency anemia, unspecified: Secondary | ICD-10-CM | POA: Diagnosis not present

## 2016-12-06 DIAGNOSIS — E875 Hyperkalemia: Secondary | ICD-10-CM | POA: Diagnosis not present

## 2016-12-08 DIAGNOSIS — D631 Anemia in chronic kidney disease: Secondary | ICD-10-CM | POA: Diagnosis not present

## 2016-12-08 DIAGNOSIS — D509 Iron deficiency anemia, unspecified: Secondary | ICD-10-CM | POA: Diagnosis not present

## 2016-12-08 DIAGNOSIS — N186 End stage renal disease: Secondary | ICD-10-CM | POA: Diagnosis not present

## 2016-12-08 DIAGNOSIS — N2581 Secondary hyperparathyroidism of renal origin: Secondary | ICD-10-CM | POA: Diagnosis not present

## 2016-12-08 DIAGNOSIS — E875 Hyperkalemia: Secondary | ICD-10-CM | POA: Diagnosis not present

## 2016-12-08 DIAGNOSIS — E1129 Type 2 diabetes mellitus with other diabetic kidney complication: Secondary | ICD-10-CM | POA: Diagnosis not present

## 2016-12-10 ENCOUNTER — Other Ambulatory Visit: Payer: Self-pay | Admitting: Family Medicine

## 2016-12-11 DIAGNOSIS — E875 Hyperkalemia: Secondary | ICD-10-CM | POA: Diagnosis not present

## 2016-12-11 DIAGNOSIS — E1129 Type 2 diabetes mellitus with other diabetic kidney complication: Secondary | ICD-10-CM | POA: Diagnosis not present

## 2016-12-11 DIAGNOSIS — D631 Anemia in chronic kidney disease: Secondary | ICD-10-CM | POA: Diagnosis not present

## 2016-12-11 DIAGNOSIS — N2581 Secondary hyperparathyroidism of renal origin: Secondary | ICD-10-CM | POA: Diagnosis not present

## 2016-12-11 DIAGNOSIS — D509 Iron deficiency anemia, unspecified: Secondary | ICD-10-CM | POA: Diagnosis not present

## 2016-12-11 DIAGNOSIS — N186 End stage renal disease: Secondary | ICD-10-CM | POA: Diagnosis not present

## 2016-12-12 ENCOUNTER — Other Ambulatory Visit: Payer: Medicare Other

## 2016-12-13 DIAGNOSIS — E1129 Type 2 diabetes mellitus with other diabetic kidney complication: Secondary | ICD-10-CM | POA: Diagnosis not present

## 2016-12-13 DIAGNOSIS — E875 Hyperkalemia: Secondary | ICD-10-CM | POA: Diagnosis not present

## 2016-12-13 DIAGNOSIS — N186 End stage renal disease: Secondary | ICD-10-CM | POA: Diagnosis not present

## 2016-12-13 DIAGNOSIS — D631 Anemia in chronic kidney disease: Secondary | ICD-10-CM | POA: Diagnosis not present

## 2016-12-13 DIAGNOSIS — D509 Iron deficiency anemia, unspecified: Secondary | ICD-10-CM | POA: Diagnosis not present

## 2016-12-13 DIAGNOSIS — N2581 Secondary hyperparathyroidism of renal origin: Secondary | ICD-10-CM | POA: Diagnosis not present

## 2016-12-15 DIAGNOSIS — D509 Iron deficiency anemia, unspecified: Secondary | ICD-10-CM | POA: Diagnosis not present

## 2016-12-15 DIAGNOSIS — N186 End stage renal disease: Secondary | ICD-10-CM | POA: Diagnosis not present

## 2016-12-15 DIAGNOSIS — E1129 Type 2 diabetes mellitus with other diabetic kidney complication: Secondary | ICD-10-CM | POA: Diagnosis not present

## 2016-12-15 DIAGNOSIS — D631 Anemia in chronic kidney disease: Secondary | ICD-10-CM | POA: Diagnosis not present

## 2016-12-15 DIAGNOSIS — N2581 Secondary hyperparathyroidism of renal origin: Secondary | ICD-10-CM | POA: Diagnosis not present

## 2016-12-15 DIAGNOSIS — E875 Hyperkalemia: Secondary | ICD-10-CM | POA: Diagnosis not present

## 2016-12-18 DIAGNOSIS — D509 Iron deficiency anemia, unspecified: Secondary | ICD-10-CM | POA: Diagnosis not present

## 2016-12-18 DIAGNOSIS — N2581 Secondary hyperparathyroidism of renal origin: Secondary | ICD-10-CM | POA: Diagnosis not present

## 2016-12-18 DIAGNOSIS — D631 Anemia in chronic kidney disease: Secondary | ICD-10-CM | POA: Diagnosis not present

## 2016-12-18 DIAGNOSIS — E1129 Type 2 diabetes mellitus with other diabetic kidney complication: Secondary | ICD-10-CM | POA: Diagnosis not present

## 2016-12-18 DIAGNOSIS — N186 End stage renal disease: Secondary | ICD-10-CM | POA: Diagnosis not present

## 2016-12-18 DIAGNOSIS — E875 Hyperkalemia: Secondary | ICD-10-CM | POA: Diagnosis not present

## 2016-12-20 DIAGNOSIS — N186 End stage renal disease: Secondary | ICD-10-CM | POA: Diagnosis not present

## 2016-12-20 DIAGNOSIS — D509 Iron deficiency anemia, unspecified: Secondary | ICD-10-CM | POA: Diagnosis not present

## 2016-12-20 DIAGNOSIS — E1129 Type 2 diabetes mellitus with other diabetic kidney complication: Secondary | ICD-10-CM | POA: Diagnosis not present

## 2016-12-20 DIAGNOSIS — D631 Anemia in chronic kidney disease: Secondary | ICD-10-CM | POA: Diagnosis not present

## 2016-12-20 DIAGNOSIS — E875 Hyperkalemia: Secondary | ICD-10-CM | POA: Diagnosis not present

## 2016-12-20 DIAGNOSIS — N2581 Secondary hyperparathyroidism of renal origin: Secondary | ICD-10-CM | POA: Diagnosis not present

## 2016-12-21 ENCOUNTER — Ambulatory Visit
Admission: RE | Admit: 2016-12-21 | Discharge: 2016-12-21 | Disposition: A | Discharge status: Home / Self Care | Payer: Medicare Other | Source: Ambulatory Visit | Attending: Orthopedic Surgery | Admitting: Orthopedic Surgery

## 2016-12-21 DIAGNOSIS — M545 Low back pain: Secondary | ICD-10-CM

## 2016-12-22 ENCOUNTER — Ambulatory Visit (INDEPENDENT_AMBULATORY_CARE_PROVIDER_SITE_OTHER): Payer: Medicare Other | Admitting: Neurology

## 2016-12-22 ENCOUNTER — Encounter: Payer: Self-pay | Admitting: Neurology

## 2016-12-22 VITALS — BP 190/108 | HR 98 | Ht 74.0 in | Wt 269.0 lb

## 2016-12-22 DIAGNOSIS — R269 Unspecified abnormalities of gait and mobility: Secondary | ICD-10-CM

## 2016-12-22 DIAGNOSIS — E1129 Type 2 diabetes mellitus with other diabetic kidney complication: Secondary | ICD-10-CM | POA: Diagnosis not present

## 2016-12-22 DIAGNOSIS — N186 End stage renal disease: Secondary | ICD-10-CM | POA: Diagnosis not present

## 2016-12-22 DIAGNOSIS — N2581 Secondary hyperparathyroidism of renal origin: Secondary | ICD-10-CM | POA: Diagnosis not present

## 2016-12-22 DIAGNOSIS — E875 Hyperkalemia: Secondary | ICD-10-CM | POA: Diagnosis not present

## 2016-12-22 DIAGNOSIS — E1142 Type 2 diabetes mellitus with diabetic polyneuropathy: Secondary | ICD-10-CM | POA: Diagnosis not present

## 2016-12-22 DIAGNOSIS — I200 Unstable angina: Secondary | ICD-10-CM

## 2016-12-22 DIAGNOSIS — E538 Deficiency of other specified B group vitamins: Secondary | ICD-10-CM

## 2016-12-22 DIAGNOSIS — K3184 Gastroparesis: Secondary | ICD-10-CM

## 2016-12-22 DIAGNOSIS — G8250 Quadriplegia, unspecified: Secondary | ICD-10-CM | POA: Insufficient documentation

## 2016-12-22 DIAGNOSIS — D509 Iron deficiency anemia, unspecified: Secondary | ICD-10-CM | POA: Diagnosis not present

## 2016-12-22 DIAGNOSIS — D631 Anemia in chronic kidney disease: Secondary | ICD-10-CM | POA: Diagnosis not present

## 2016-12-22 HISTORY — DX: Gastroparesis: K31.84

## 2016-12-22 HISTORY — DX: Unspecified abnormalities of gait and mobility: R26.9

## 2016-12-22 MED ORDER — ALPRAZOLAM 0.5 MG PO TABS: ORAL_TABLET | ORAL | 0 refills | Status: DC

## 2016-12-22 NOTE — Progress Notes (Signed)
Reason for visit: Gait disturbance, numbness  Referring physician: Dr. Rometta Emery [name] is a 46 y.o. male  History of present illness:  Russell [name] is a 46 year old right-handed white male with a history of end-stage renal disease on hemodialysis.  The patient has a history of diabetes, he has a history of a diabetic peripheral neuropathy that has been present for several years.  Over the last 3 years or so he has noted numbness in the feet, he has discomfort from the knee level down.  He has had some mild gait instability, but he has not required a cane for ambulation.  The patient took a trip out to [place] in October 2018, he missed 1 hemodialysis session, when he came back he was very sick and he was noted to have an extremely high blood pressure.  He required hospitalization on 14 October, he was noted to have high phosphorus levels in the blood.  The patient required emergency hemodialysis.  Since that time, the patient indicates that he has been weak on all 4 extremities, he has developed pain in the neck and shoulders, pain radiates down both arms to the hands.  He is numb in the hands, he is weak on the arms and the legs.  He has developed fecal incontinence, he is now walking with a cane.  He does admit to several falls, he denies that he has ever hit his head with the falls.  The patient indicates that his blood sugars are out of control, his fasting blood sugars are now running in the 230 range, he claims that his last hemoglobin A1c was in the 13 range.  The patient does note some low back pain, he denies pain radiating down the legs.  He is sent to this office for further evaluation of the numbness and weakness.  Past Medical History:  Diagnosis Date  . Anxiety   . Asthma   . 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 Daily headache   .  Depression   . DVT (deep venous thrombosis), H/o 01/2014-on Xarelto 03/24/2014   LLE  . ESRD (end stage renal disease) on dialysis [facility])    "[place], MWF" (06/29/2016)  . Hypertension   . Hypothyroidism   . Nephrotic syndrome 05/18/2014  . Type 2 diabetes mellitus with diabetic nephropathy [facility])     Past Surgical History:  Procedure Laterality Date  . ANKLE FRACTURE SURGERY Right 1988  . AV FISTULA PLACEMENT Left 01/01/2015   Procedure: CREATION OF LEFT RADIAL CEPHALIC ARTERIOVENOUS (AV) FISTULA ;  Surgeon: Pryor Ochoa, MD;  Location: [facility] OR;  Service: Vascular;  Laterality: Left;  . CAPD REMOVAL N/A 05/19/2016   Procedure: PD CATH REMOVAL;  Surgeon: Abigail Miyamoto, MD;  Location: [facility] OR;  Service: General;  Laterality: N/A;  . CARDIAC CATHETERIZATION N/A 09/22/2015   Procedure: Left Heart Cath and Coronary Angiography;  Surgeon: Iran Ouch, MD;  Location: [facility] INVASIVE CV LAB;  Service: Cardiovascular;  Laterality: N/A;  . CORONARY ARTERY BYPASS GRAFT N/A 09/28/2015   Procedure: CORONARY ARTERY BYPASS GRAFTING (CABG) x 3 UTILIZING LEFT MAMMARY ARTERY AND ENDOSCOPICALLY HARVESTED LEFT GREATER SAPHENOUS VEIN.;  Surgeon: Delight Ovens, MD;  Location: [facility] OR;  Service: Open Heart Surgery;  Laterality: N/A;  . ENDOVEIN HARVEST OF GREATER SAPHENOUS VEIN Left 09/28/2015   Procedure: ENDOVEIN HARVEST  OF GREATER SAPHENOUS VEIN;  Surgeon: Delight Ovens, MD;  Location: [facility] OR;  Service: Open Heart Surgery;  Laterality: Left;  . ESOPHAGOGASTRODUODENOSCOPY N/A 03/29/2014   Procedure: ESOPHAGOGASTRODUODENOSCOPY (EGD);  Surgeon: Dorena Cookey, MD;  Location: [facility] ENDOSCOPY;  Service: Endoscopy;  Laterality: N/A;  . EYE SURGERY    . FRACTURE SURGERY    . INSERTION OF DIALYSIS CATHETER Right 01/05/2015   Procedure: INSERTION OF RIGHT INTERNAL JUGULAR DIALYSIS CATHETER;  Surgeon: Fransisco Hertz, MD;  Location: [facility] OR;  Service: Vascular;  Laterality: Right;  . LEFT HEART CATH AND CORS/GRAFTS ANGIOGRAPHY  N/A 09/13/2016   Procedure: LEFT HEART CATH AND CORS/GRAFTS ANGIOGRAPHY;  Surgeon: Kathleene Hazel, MD;  Location: [facility] INVASIVE CV LAB;  Service: Cardiovascular;  Laterality: N/A;  . RETINAL LASER PROCEDURE Bilateral   . TEE WITHOUT CARDIOVERSION N/A 09/28/2015   Procedure: TRANSESOPHAGEAL ECHOCARDIOGRAM (TEE);  Surgeon: Delight Ovens, MD;  Location: [facility] OR;  Service: Open Heart Surgery;  Laterality: N/A;  . TONSILLECTOMY AND ADENOIDECTOMY  1970s    Family History  Problem Relation Age of Onset  . Obesity Mother        Patient states that family members have no other medical illnesses other than what I have described  . Kidney cancer Maternal Grandmother   . Cancer Father 50       AML  . Heart disease Unknown        No family history    Social history:  reports that  has never smoked. he has never used smokeless tobacco. He reports that he drinks alcohol. He reports that he does not use drugs.  Medications:  Prior to Admission medications   Medication Sig Start Date End Date Taking? Authorizing Provider  acetaminophen (TYLENOL) 500 MG tablet Take 1 tablet (500 mg total) by mouth every 6 (six) hours as needed for moderate pain. Patient taking differently: Take 1,000 mg by mouth every 6 (six) hours as needed (for pain).  09/29/14  Yes Albertine Grates, MD  albuterol (PROVENTIL HFA;VENTOLIN HFA) 108 (90 Base) MCG/ACT inhaler Inhale 1 puff into the lungs every 6 (six) hours as needed for wheezing or shortness of breath. Patient taking differently: Inhale 2 puffs into the lungs every 8 (eight) hours as needed for wheezing or shortness of breath.  03/11/15  Yes [name], MD  albuterol (PROVENTIL) (2.5 MG/3ML) 0.083% nebulizer solution Take 3 mLs (2.5 mg total) by nebulization every 4 (four) hours as needed for wheezing. 09/20/16  Yes [name], MD  albuterol-ipratropium (COMBIVENT) 18-103 MCG/ACT inhaler Inhale 2 puffs into the lungs every 4 (four) hours. Patient taking differently:  Inhale 2 puffs into the lungs 3 (three) times daily as needed for wheezing or shortness of breath.  09/03/14  Yes [name], MD  aspirin EC 81 MG EC tablet Take 1 tablet (81 mg total) by mouth daily. 10/06/15  Yes Barrett, Erin R, PA-C  atorvastatin (LIPITOR) 80 MG tablet Take 1 tablet (80 mg total) by mouth daily at 6 PM. 04/17/16  Yes Shade Flood, MD  calcitRIOL (ROCALTROL) 0.25 MCG capsule Take 1 capsule (0.25 mcg total) by mouth every Monday, Wednesday, and Friday with hemodialysis. Patient taking differently: Take 0.25 mcg by mouth See admin instructions. Tuesday Thursday Saturday 10/06/15  Yes Barrett, Erin R, PA-C  calcium acetate (PHOSLO) 667 MG capsule Take 2 capsules (1,334 mg total) by mouth 3 (three) times daily with meals. Patient taking differently: Take 1,334-2,001 mg by mouth See admin instructions. Take 2001  mg by mouth 3 times daily with meals and take 1334 mg by mouth with snacks 01/08/15  Yes Rolly SalterPatel, Pranav M, MD  cinacalcet [facility](SENSIPAR) 30 MG tablet Take 30 mg by mouth every Monday, Wednesday, and Friday.   Yes [provider]  citalopram (CELEXA) 40 MG tablet Take 40 mg by mouth at bedtime.    Yes [provider]  clonazePAM (KLONOPIN) 2 MG tablet Take 2 mg by mouth as needed.    Yes [provider]  clopidogrel (PLAVIX) 75 MG tablet Take 1 tablet (75 mg total) by mouth daily. 01/27/16  Yes Lewayne Buntingrenshaw, Brian S, MD  cyclobenzaprine (FLEXERIL) 10 MG tablet Take 1 tablet (10 mg total) 3 times/day as needed-between meals & bedtime by mouth for muscle spasms. 11/07/16  Yes Sherren MochaShaw, Eva N, MD  gabapentin (NEURONTIN) 100 MG capsule Take 2 capsules (200 mg total) at bedtime by mouth. 11/07/16  Yes Sherren MochaShaw, Eva N, MD  hydrALAZINE (APRESOLINE) 25 MG tablet Take 1 tablet (25 mg total) by mouth 3 (three) times daily as needed (take for blood pressure above 160 if not controlled with home meds). 10/15/16 10/15/17 Yes Rolly SalterPatel, Pranav M, MD  HYDROcodone-acetaminophen  (NORCO/VICODIN) 5-325 MG tablet Take 1-2 tablets by mouth every 4 (four) hours as needed for moderate pain. 08/17/16  Yes Sherren MochaShaw, Eva N, MD  HYDROmorphone (DILAUDID) 2 MG tablet Take 2 mg by mouth 3 (three) times daily.   Yes [provider]  hydrOXYzine (ATARAX/VISTARIL) 25 MG tablet Take 25 mg by mouth 2 (two) times daily as needed for anxiety.    Yes [provider]  insulin glargine (LANTUS) 100 UNIT/ML injection Inject 0.4 mLs (40 Units total) into the skin 2 (two) times daily. Patient taking differently: Inject 30 Units into the skin daily.  05/20/16  Yes Dhungel, Nishant, MD  insulin lispro (HUMALOG) 100 UNIT/ML injection Inject 0.15-0.3 mLs (15-30 Units total) into the skin every evening. Take one unit per 5 carbs depending on dinner. 11/29/15  Yes Carlus PavlovGherghe, Cristina, MD  levothyroxine (SYNTHROID, LEVOTHROID) 75 MCG tablet Take 1 tablet (75 mcg total) by mouth daily before breakfast. 09/29/16  Yes Maxie BarbBhandari, Dron Prasad, MD  metoCLOPramide (REGLAN) 5 MG tablet Take 1 tablet (5 mg total) by mouth every 8 (eight) hours as needed for nausea or vomiting. Patient taking differently: Take 10 mg by mouth 3 (three) times daily.  01/10/16  Yes Dong, Robin, DO  metoprolol tartrate (LOPRESSOR) 100 MG tablet Take 1 tab twice a day on dialysis days and once a day on other days. 10/15/16  Yes Rolly SalterPatel, Pranav M, MD  amLODipine (NORVASC) 5 MG tablet Take 1 tablet (5 mg total) by mouth as directed. Take twice a day on dialysis days and once a day on other days. Patient not taking: Reported on 12/22/2016 10/15/16   Rolly SalterPatel, Pranav M, MD      Allergies  Allergen Reactions  . Ibuprofen Other (See Comments)    MD told patient not to take due to kidney disease  . Nsaids Other (See Comments)    Told to avoid all nsaids due to kidney disease   . Tape Other (See Comments)    Welts result, if left for a long amount of time    ROS:  Out of a complete 14 system review of symptoms, the patient complains  only of the following symptoms, and all other reviewed systems are negative.  Weakness, numbness Neck pain, low back pain Gait disturbance  Blood pressure (!) 190/108, pulse 98, height  6\' 2"  (1.88 m), weight 269 lb (122 kg).  Physical Exam  General: The patient is alert and cooperative at the time of the examination.  The patient is moderately to markedly obese.  Eyes: Pupils are equal, round, and reactive to light. Discs are flat bilaterally.  Neck: The neck is supple, no carotid bruits are noted.  Respiratory: The respiratory examination is clear.  Cardiovascular: The cardiovascular examination reveals a regular rate and rhythm, no obvious murmurs or rubs are noted.  Skin: Extremities are with 2+ edema below the knees bilaterally.  Neurologic Exam  Mental status: The patient is alert and oriented x 3 at the time of the examination. The patient has apparent normal recent and remote memory, with an apparently normal attention span and concentration ability.  Cranial nerves: Facial symmetry is present. There is good sensation of the face to pinprick and soft touch bilaterally. The strength of the facial muscles and the muscles to head turning and shoulder shrug are normal bilaterally. Speech is well enunciated, no aphasia or dysarthria is noted. Extraocular movements are full. Visual fields are full. The tongue is midline, and the patient has symmetric elevation of the soft palate. No obvious hearing deficits are noted.  Motor: The motor testing reveals diffuse weakness of both upper extremities, 4/5 strength distally and proximally.  With the lower extremities, there is 4/5 strength with hip flexion, good strength with knee flexion extension, bilateral foot drops are seen. Good symmetric motor tone is noted throughout.  Sensory: Sensory testing is intact to pinprick, soft touch, vibration sensation, and position sense on the upper extremities.  With the lower extremities there is a  stocking pattern pinprick sensory deficit across the knees bilaterally, market impairment of vibration sensation and position sensation is seen in both feet.  No evidence of extinction is noted.  Coordination: Cerebellar testing reveals good finger-nose-finger and heel-to-shin bilaterally.  Gait and station: Gait is wide-based, unsteady.  The patient is not able to perform tandem gait.  Romberg is unsteady but the patient does not fall.  Reflexes: Deep tendon reflexes are symmetric, but are depressed bilaterally. Toes are downgoing bilaterally.   Assessment/Plan:  1.  Diabetic peripheral neuropathy  2.  Gait disturbance  3.  Neck, low back pain  4.  Diffuse weakness, quadriparesis  The patient has had a recent onset of neck pain, bilateral arm pain, and the quadriparesis associated with fecal incontinence that has developed within the last 2 months.  The patient will need to be evaluated for possible spinal cord compression, MRI of the cervical spine will be set up.  The patient will require an open scanner, he is claustrophobic.  The patient will be set up for blood work today.  He will have nerve conduction studies done on both arms and one leg, EMG on one arm and one leg.  He will follow-up for the EMG evaluation.  Marlan Palau. Keith Willis MD 12/22/2016 9:07 AM  [facility] [address] [place], [place] [id number]  Phone [phone] Fax [phone]

## 2016-12-22 NOTE — Patient Instructions (Signed)
    We will check blood work today and get EMG and NCV evaluation to look at nerve function of the arms and legs. We will get MRI of the neck.

## 2016-12-23 DIAGNOSIS — D631 Anemia in chronic kidney disease: Secondary | ICD-10-CM | POA: Diagnosis not present

## 2016-12-23 DIAGNOSIS — E039 Hypothyroidism, unspecified: Secondary | ICD-10-CM | POA: Diagnosis not present

## 2016-12-23 DIAGNOSIS — Z86718 Personal history of other venous thrombosis and embolism: Secondary | ICD-10-CM | POA: Diagnosis not present

## 2016-12-23 DIAGNOSIS — N186 End stage renal disease: Secondary | ICD-10-CM | POA: Diagnosis not present

## 2016-12-23 DIAGNOSIS — I2510 Atherosclerotic heart disease of native coronary artery without angina pectoris: Secondary | ICD-10-CM | POA: Diagnosis not present

## 2016-12-23 DIAGNOSIS — F419 Anxiety disorder, unspecified: Secondary | ICD-10-CM | POA: Diagnosis not present

## 2016-12-23 DIAGNOSIS — E1165 Type 2 diabetes mellitus with hyperglycemia: Secondary | ICD-10-CM | POA: Diagnosis not present

## 2016-12-23 DIAGNOSIS — Z7982 Long term (current) use of aspirin: Secondary | ICD-10-CM | POA: Diagnosis not present

## 2016-12-23 DIAGNOSIS — E1122 Type 2 diabetes mellitus with diabetic chronic kidney disease: Secondary | ICD-10-CM | POA: Diagnosis not present

## 2016-12-23 DIAGNOSIS — E1151 Type 2 diabetes mellitus with diabetic peripheral angiopathy without gangrene: Secondary | ICD-10-CM | POA: Diagnosis not present

## 2016-12-23 DIAGNOSIS — Z992 Dependence on renal dialysis: Secondary | ICD-10-CM | POA: Diagnosis not present

## 2016-12-23 DIAGNOSIS — J45909 Unspecified asthma, uncomplicated: Secondary | ICD-10-CM | POA: Diagnosis not present

## 2016-12-23 DIAGNOSIS — Z951 Presence of aortocoronary bypass graft: Secondary | ICD-10-CM | POA: Diagnosis not present

## 2016-12-23 DIAGNOSIS — Z7984 Long term (current) use of oral hypoglycemic drugs: Secondary | ICD-10-CM | POA: Diagnosis not present

## 2016-12-23 DIAGNOSIS — I120 Hypertensive chronic kidney disease with stage 5 chronic kidney disease or end stage renal disease: Secondary | ICD-10-CM | POA: Diagnosis not present

## 2016-12-24 DIAGNOSIS — N186 End stage renal disease: Secondary | ICD-10-CM | POA: Diagnosis not present

## 2016-12-24 DIAGNOSIS — D631 Anemia in chronic kidney disease: Secondary | ICD-10-CM | POA: Diagnosis not present

## 2016-12-24 DIAGNOSIS — E1129 Type 2 diabetes mellitus with other diabetic kidney complication: Secondary | ICD-10-CM | POA: Diagnosis not present

## 2016-12-24 DIAGNOSIS — N2581 Secondary hyperparathyroidism of renal origin: Secondary | ICD-10-CM | POA: Diagnosis not present

## 2016-12-24 DIAGNOSIS — E875 Hyperkalemia: Secondary | ICD-10-CM | POA: Diagnosis not present

## 2016-12-24 DIAGNOSIS — D509 Iron deficiency anemia, unspecified: Secondary | ICD-10-CM | POA: Diagnosis not present

## 2016-12-27 DIAGNOSIS — D509 Iron deficiency anemia, unspecified: Secondary | ICD-10-CM | POA: Diagnosis not present

## 2016-12-27 DIAGNOSIS — D631 Anemia in chronic kidney disease: Secondary | ICD-10-CM | POA: Diagnosis not present

## 2016-12-27 DIAGNOSIS — E875 Hyperkalemia: Secondary | ICD-10-CM | POA: Diagnosis not present

## 2016-12-27 DIAGNOSIS — E1129 Type 2 diabetes mellitus with other diabetic kidney complication: Secondary | ICD-10-CM | POA: Diagnosis not present

## 2016-12-27 DIAGNOSIS — N2581 Secondary hyperparathyroidism of renal origin: Secondary | ICD-10-CM | POA: Diagnosis not present

## 2016-12-27 DIAGNOSIS — N186 End stage renal disease: Secondary | ICD-10-CM | POA: Diagnosis not present

## 2016-12-27 LAB — MULTIPLE MYELOMA PANEL, SERUM
ALBUMIN SERPL ELPH-MCNC: 3.7 g/dL (ref 2.9–4.4)
ALPHA 1: 0.3 g/dL (ref 0.0–0.4)
Albumin/Glob SerPl: 1.2 (ref 0.7–1.7)
Alpha2 Glob SerPl Elph-Mcnc: 0.8 g/dL (ref 0.4–1.0)
B-Globulin SerPl Elph-Mcnc: 0.9 g/dL (ref 0.7–1.3)
Gamma Glob SerPl Elph-Mcnc: 1.4 g/dL (ref 0.4–1.8)
Globulin, Total: 3.3 g/dL (ref 2.2–3.9)
IGA/IMMUNOGLOBULIN A, SERUM: 179 mg/dL (ref 90–386)
IGG (IMMUNOGLOBIN G), SERUM: 1280 mg/dL (ref 700–1600)
IGM (IMMUNOGLOBULIN M), SRM: 123 mg/dL (ref 20–172)
TOTAL PROTEIN: 7 g/dL (ref 6.0–8.5)

## 2016-12-27 LAB — CK: CK TOTAL: 426 U/L — AB (ref 24–204)

## 2016-12-27 LAB — RPR: RPR Ser Ql: NONREACTIVE

## 2016-12-27 LAB — HIV ANTIBODY (ROUTINE TESTING W REFLEX): HIV Screen 4th Generation wRfx: NONREACTIVE

## 2016-12-27 LAB — B. BURGDORFI ANTIBODIES: Lyme IgG/IgM Ab: <0.91 {ISR} (ref 0.00–0.90)

## 2016-12-27 LAB — VITAMIN B12: Vitamin B-12: 413 pg/mL (ref 232–1245)

## 2016-12-27 LAB — RHEUMATOID FACTOR: Rhuematoid fact SerPl-aCnc: <10 IU/mL (ref 0.0–13.9)

## 2016-12-27 LAB — ANGIOTENSIN CONVERTING ENZYME: ANGIO CONVERT ENZYME: 57 U/L (ref 14–82)

## 2016-12-27 LAB — ANA W/REFLEX: ANA: NEGATIVE

## 2016-12-29 DIAGNOSIS — N186 End stage renal disease: Secondary | ICD-10-CM | POA: Diagnosis not present

## 2016-12-29 DIAGNOSIS — E875 Hyperkalemia: Secondary | ICD-10-CM | POA: Diagnosis not present

## 2016-12-29 DIAGNOSIS — D509 Iron deficiency anemia, unspecified: Secondary | ICD-10-CM | POA: Diagnosis not present

## 2016-12-29 DIAGNOSIS — D631 Anemia in chronic kidney disease: Secondary | ICD-10-CM | POA: Diagnosis not present

## 2016-12-29 DIAGNOSIS — N2581 Secondary hyperparathyroidism of renal origin: Secondary | ICD-10-CM | POA: Diagnosis not present

## 2016-12-29 DIAGNOSIS — E1129 Type 2 diabetes mellitus with other diabetic kidney complication: Secondary | ICD-10-CM | POA: Diagnosis not present

## 2016-12-31 DIAGNOSIS — N2581 Secondary hyperparathyroidism of renal origin: Secondary | ICD-10-CM | POA: Diagnosis not present

## 2016-12-31 DIAGNOSIS — D509 Iron deficiency anemia, unspecified: Secondary | ICD-10-CM | POA: Diagnosis not present

## 2016-12-31 DIAGNOSIS — E875 Hyperkalemia: Secondary | ICD-10-CM | POA: Diagnosis not present

## 2016-12-31 DIAGNOSIS — N186 End stage renal disease: Secondary | ICD-10-CM | POA: Diagnosis not present

## 2016-12-31 DIAGNOSIS — D631 Anemia in chronic kidney disease: Secondary | ICD-10-CM | POA: Diagnosis not present

## 2016-12-31 DIAGNOSIS — E1129 Type 2 diabetes mellitus with other diabetic kidney complication: Secondary | ICD-10-CM | POA: Diagnosis not present

## 2017-01-01 DIAGNOSIS — N049 Nephrotic syndrome with unspecified morphologic changes: Secondary | ICD-10-CM | POA: Diagnosis not present

## 2017-01-01 DIAGNOSIS — Z992 Dependence on renal dialysis: Secondary | ICD-10-CM | POA: Diagnosis not present

## 2017-01-01 DIAGNOSIS — N186 End stage renal disease: Secondary | ICD-10-CM | POA: Diagnosis not present

## 2017-01-01 DIAGNOSIS — E119 Type 2 diabetes mellitus without complications: Secondary | ICD-10-CM | POA: Diagnosis not present

## 2017-01-03 DIAGNOSIS — D631 Anemia in chronic kidney disease: Secondary | ICD-10-CM | POA: Diagnosis not present

## 2017-01-03 DIAGNOSIS — M47812 Spondylosis without myelopathy or radiculopathy, cervical region: Secondary | ICD-10-CM | POA: Diagnosis not present

## 2017-01-03 DIAGNOSIS — D509 Iron deficiency anemia, unspecified: Secondary | ICD-10-CM | POA: Diagnosis not present

## 2017-01-03 DIAGNOSIS — M50321 Other cervical disc degeneration at C4-C5 level: Secondary | ICD-10-CM | POA: Diagnosis not present

## 2017-01-03 DIAGNOSIS — N2581 Secondary hyperparathyroidism of renal origin: Secondary | ICD-10-CM | POA: Diagnosis not present

## 2017-01-03 DIAGNOSIS — E875 Hyperkalemia: Secondary | ICD-10-CM | POA: Diagnosis not present

## 2017-01-03 DIAGNOSIS — E1129 Type 2 diabetes mellitus with other diabetic kidney complication: Secondary | ICD-10-CM | POA: Diagnosis not present

## 2017-01-03 DIAGNOSIS — N186 End stage renal disease: Secondary | ICD-10-CM | POA: Diagnosis not present

## 2017-01-04 ENCOUNTER — Telehealth: Payer: Self-pay | Admitting: *Deleted

## 2017-01-04 DIAGNOSIS — I120 Hypertensive chronic kidney disease with stage 5 chronic kidney disease or end stage renal disease: Secondary | ICD-10-CM | POA: Diagnosis not present

## 2017-01-04 DIAGNOSIS — E1151 Type 2 diabetes mellitus with diabetic peripheral angiopathy without gangrene: Secondary | ICD-10-CM | POA: Diagnosis not present

## 2017-01-04 DIAGNOSIS — N186 End stage renal disease: Secondary | ICD-10-CM | POA: Diagnosis not present

## 2017-01-04 DIAGNOSIS — E1122 Type 2 diabetes mellitus with diabetic chronic kidney disease: Secondary | ICD-10-CM | POA: Diagnosis not present

## 2017-01-04 DIAGNOSIS — I2510 Atherosclerotic heart disease of native coronary artery without angina pectoris: Secondary | ICD-10-CM | POA: Diagnosis not present

## 2017-01-04 DIAGNOSIS — D631 Anemia in chronic kidney disease: Secondary | ICD-10-CM | POA: Diagnosis not present

## 2017-01-04 NOTE — Telephone Encounter (Signed)
[facility] 01/03/17. Impression: mild degenerative changes, no spinal stenosis or spinal cord abnormality.   Had AA,MD review report. She is the [facility] since CW,MD out of the office. She advised report essentially normal, ok to call pt with results.  I called patient and relayed above results. Reminded him of EMG/NCS appt on 01/10/17. Advised CW,MD can always review report with him if needed at that appt. He verbalized understanding and appreciation for call.   Gave report to CW,MD to review once back in the office.

## 2017-01-05 ENCOUNTER — Ambulatory Visit (INDEPENDENT_AMBULATORY_CARE_PROVIDER_SITE_OTHER): Payer: Medicare Other | Admitting: Internal Medicine

## 2017-01-05 ENCOUNTER — Encounter: Payer: Self-pay | Admitting: Physician Assistant

## 2017-01-05 ENCOUNTER — Ambulatory Visit (INDEPENDENT_AMBULATORY_CARE_PROVIDER_SITE_OTHER): Payer: Medicare Other | Admitting: Physician Assistant

## 2017-01-05 ENCOUNTER — Encounter: Payer: Self-pay | Admitting: Internal Medicine

## 2017-01-05 VITALS — BP 128/90 | HR 104 | Ht 74.0 in | Wt 267.8 lb

## 2017-01-05 VITALS — BP 124/78 | HR 102 | Ht 74.0 in | Wt 269.0 lb

## 2017-01-05 DIAGNOSIS — IMO0002 Reserved for concepts with insufficient information to code with codable children: Secondary | ICD-10-CM

## 2017-01-05 DIAGNOSIS — N186 End stage renal disease: Secondary | ICD-10-CM | POA: Diagnosis not present

## 2017-01-05 DIAGNOSIS — E1151 Type 2 diabetes mellitus with diabetic peripheral angiopathy without gangrene: Secondary | ICD-10-CM | POA: Diagnosis not present

## 2017-01-05 DIAGNOSIS — D509 Iron deficiency anemia, unspecified: Secondary | ICD-10-CM | POA: Diagnosis not present

## 2017-01-05 DIAGNOSIS — I2510 Atherosclerotic heart disease of native coronary artery without angina pectoris: Secondary | ICD-10-CM

## 2017-01-05 DIAGNOSIS — E1129 Type 2 diabetes mellitus with other diabetic kidney complication: Secondary | ICD-10-CM | POA: Diagnosis not present

## 2017-01-05 DIAGNOSIS — E785 Hyperlipidemia, unspecified: Secondary | ICD-10-CM | POA: Diagnosis not present

## 2017-01-05 DIAGNOSIS — D631 Anemia in chronic kidney disease: Secondary | ICD-10-CM | POA: Diagnosis not present

## 2017-01-05 DIAGNOSIS — E039 Hypothyroidism, unspecified: Secondary | ICD-10-CM | POA: Diagnosis not present

## 2017-01-05 DIAGNOSIS — N2581 Secondary hyperparathyroidism of renal origin: Secondary | ICD-10-CM | POA: Diagnosis not present

## 2017-01-05 DIAGNOSIS — E119 Type 2 diabetes mellitus without complications: Secondary | ICD-10-CM

## 2017-01-05 DIAGNOSIS — E1165 Type 2 diabetes mellitus with hyperglycemia: Secondary | ICD-10-CM

## 2017-01-05 DIAGNOSIS — Z992 Dependence on renal dialysis: Secondary | ICD-10-CM | POA: Diagnosis not present

## 2017-01-05 DIAGNOSIS — I10 Essential (primary) hypertension: Secondary | ICD-10-CM | POA: Diagnosis not present

## 2017-01-05 DIAGNOSIS — E875 Hyperkalemia: Secondary | ICD-10-CM | POA: Diagnosis not present

## 2017-01-05 LAB — POCT GLYCOSYLATED HEMOGLOBIN (HGB A1C): Hemoglobin A1C: 11.9

## 2017-01-05 MED ORDER — INSULIN GLARGINE 100 UNIT/ML ~~LOC~~ SOLN: 30.0000 [IU] | Freq: Every day | SUBCUTANEOUS | 3 refills | Status: DC

## 2017-01-05 MED ORDER — METOPROLOL TARTRATE 100 MG PO TABS: ORAL_TABLET | ORAL | 9 refills | Status: DC

## 2017-01-05 MED ORDER — ATORVASTATIN CALCIUM 80 MG PO TABS: 80.0000 mg | ORAL_TABLET | Freq: Every day | ORAL | 3 refills | Status: AC

## 2017-01-05 MED ORDER — LIRAGLUTIDE 18 MG/3ML ~~LOC~~ SOPN: 1.8000 mg | PEN_INJECTOR | Freq: Every day | SUBCUTANEOUS | 3 refills | Status: DC

## 2017-01-05 MED ORDER — LEVOTHYROXINE SODIUM 50 MCG PO TABS: 50.0000 ug | ORAL_TABLET | Freq: Every day | ORAL | 3 refills | Status: DC

## 2017-01-05 NOTE — Progress Notes (Signed)
Patient ID: KENDELL [name], male   DOB: 23-Sep-1970, 47 y.o.   MRN: [id number]  HPI: Russell [name] is a 47 y.o.-year-old male, returning for f/u for DM2, dx in 2011, insulin-dependent 2014, uncontrolled, with complications (DR, PN, CAD - 3x CABG 2017, ESRD - HD MWF, h/o DKA). Last visit 1 year and 2 months ago!  Since last visit, he stopped insulin completely (!) for 6 mo and is on Victoza samples 1.2 mg daily. He tolerates this well. Sugars were very high before starting Victoza, now a little better.  He started HD since last visit. Peritoneal dialysis did not work well for him.  Last hemoglobin A1c was: Lab Results  Component Value Date   HGBA1C 10.8 (H) 10/14/2016   HGBA1C 12.8 (H) 05/17/2016   HGBA1C 12.5 (H) 09/28/2015   Pt is on a regimen of:  - Victoza 1.2 mg daily in am - started 1 week ago  Pt checks his sugars 1-3x times a day: - am: 147-210 >> 93-140 >> 250-300, 500 (victoza: 140-200) - 2h after b'fast: 250, 266 >> n/c - before lunch: 138, 176-292 >> 162 >> 300s - 2h after lunch: 83-330 >> 218, 246 >> n/c - before dinner: 133-234 >> 155-178 >> 200s - 2h after dinner: 84, 113-288 >> 151, 201, 236 >> n/c - bedtime: 88-199 >> 94 >> n/c - nighttime: n/c Lowest sugar was 80s >> 145 on Victoza; he has hypoglycemia awareness at 70.  Highest sugar was 300s >> 500.  Glucometer: [employer], physiological.  Pt's meals are: - Breakfast: eggs, sausage, bacon, toast - Lunch: salad - Dinner: meat + veggies - Snacks: meat, cheese rolls  -+ End-stage renal disease:  Lab Results  Component Value Date   BUN 32 (H) 10/15/2016   CREATININE 6.41 (H) 10/15/2016  Off lisinopril. -+ HL; last set of lipids: Lab Results  Component Value Date   CHOL 196 05/17/2016   HDL 32 (L) 05/17/2016   LDLCALC 115 (H) 05/17/2016   TRIG 246 (H) 05/17/2016   CHOLHDL 6.1 05/17/2016  On Lipitor 80.  Previously, Zocor >>  Irritability. - last eye exam was in 12/2016.  Dr Allyne Gee.  + DR. He had eye surgery  04/2014. - he does have numbness and tingling in his feet.  On Neurontin.  He has a h/o horseshoe kidney.  He has hypothyroidism: - dx in 2015  Pt is on levothyroxine 50 mcg daily (ran out 12/2016), taken: - in am - fasting - at least 30 min from b'fast - no Ca, Fe, PPIs - + MVI after lunch - not on Biotin  Last TSH was high: Lab Results  Component Value Date   TSH 6.256 (H) 10/14/2016   Also has HTN.  ROS: Constitutional: no weight gain/no weight loss, no fatigue, no subjective hyperthermia, no subjective hypothermia Eyes: no blurry vision, no xerophthalmia ENT: no sore throat, no nodules palpated in throat, no dysphagia, no odynophagia, no hoarseness Cardiovascular: no CP/no SOB/no palpitations/no leg swelling Respiratory: no cough/no SOB/no wheezing Gastrointestinal: no N/no V/no D/no C/no acid reflux Musculoskeletal: no muscle aches/no joint aches Skin: no rashes, no hair loss Neurological: no tremors/no numbness/no tingling/no dizziness  I reviewed pt's medications, allergies, PMH, social hx, family hx, and changes were documented in the history of present illness. Otherwise, unchanged from my initial visit note.  Past Medical History:  Diagnosis Date  . Anxiety   . Asthma   . Coronary artery disease involving native coronary artery of native heart with  unstable angina pectoris (HCC)    80% LAD-95% oD1 bifurcation lesion & 90% RI --> referred for CABG  . Daily headache   . Depression   . DVT (deep venous thrombosis), H/o 01/2014-on Xarelto 03/24/2014   LLE  . ESRD (end stage renal disease) on dialysis [facility])    "[place], MWF" (06/29/2016)  . Gait abnormality 12/22/2016  . Hypertension   . Hypothyroidism   . Nephrotic syndrome 05/18/2014  . Quadriparesis (HCC) 12/22/2016  . Type 2 diabetes mellitus with diabetic nephropathy [facility])    Past Surgical History:  Procedure Laterality Date  . ANKLE FRACTURE SURGERY Right 1988  . AV FISTULA PLACEMENT Left  01/01/2015   Procedure: CREATION OF LEFT RADIAL CEPHALIC ARTERIOVENOUS (AV) FISTULA ;  Surgeon: Pryor Ochoa, MD;  Location: [facility] OR;  Service: Vascular;  Laterality: Left;  . CAPD REMOVAL N/A 05/19/2016   Procedure: PD CATH REMOVAL;  Surgeon: Abigail Miyamoto, MD;  Location: [facility] OR;  Service: General;  Laterality: N/A;  . CARDIAC CATHETERIZATION N/A 09/22/2015   Procedure: Left Heart Cath and Coronary Angiography;  Surgeon: Iran Ouch, MD;  Location: [facility] INVASIVE CV LAB;  Service: Cardiovascular;  Laterality: N/A;  . CORONARY ARTERY BYPASS GRAFT N/A 09/28/2015   Procedure: CORONARY ARTERY BYPASS GRAFTING (CABG) x 3 UTILIZING LEFT MAMMARY ARTERY AND ENDOSCOPICALLY HARVESTED LEFT GREATER SAPHENOUS VEIN.;  Surgeon: Delight Ovens, MD;  Location: [facility] OR;  Service: Open Heart Surgery;  Laterality: N/A;  . ENDOVEIN HARVEST OF GREATER SAPHENOUS VEIN Left 09/28/2015   Procedure: ENDOVEIN HARVEST OF GREATER SAPHENOUS VEIN;  Surgeon: Delight Ovens, MD;  Location: [facility] OR;  Service: Open Heart Surgery;  Laterality: Left;  . ESOPHAGOGASTRODUODENOSCOPY N/A 03/29/2014   Procedure: ESOPHAGOGASTRODUODENOSCOPY (EGD);  Surgeon: Dorena Cookey, MD;  Location: [facility];  Service: Endoscopy;  Laterality: N/A;  . EYE SURGERY    . FRACTURE SURGERY    . INSERTION OF DIALYSIS CATHETER Right 01/05/2015   Procedure: INSERTION OF RIGHT INTERNAL JUGULAR DIALYSIS CATHETER;  Surgeon: Fransisco Hertz, MD;  Location: [facility] OR;  Service: Vascular;  Laterality: Right;  . LEFT HEART CATH AND CORS/GRAFTS ANGIOGRAPHY N/A 09/13/2016   Procedure: LEFT HEART CATH AND CORS/GRAFTS ANGIOGRAPHY;  Surgeon: Kathleene Hazel, MD;  Location: [facility] INVASIVE CV LAB;  Service: Cardiovascular;  Laterality: N/A;  . RETINAL LASER PROCEDURE Bilateral   . TEE WITHOUT CARDIOVERSION N/A 09/28/2015   Procedure: TRANSESOPHAGEAL ECHOCARDIOGRAM (TEE);  Surgeon: Delight Ovens, MD;  Location: [facility] OR;  Service: Open Heart Surgery;  Laterality: N/A;  .  TONSILLECTOMY AND ADENOIDECTOMY  1970s   History   Social History  . Marital Status: Single    Spouse Name: N/A  . Number of Children: no   Occupational History  .      Maintenance Curator   Social History Main Topics  . Smoking status: Never Smoker   . Smokeless tobacco: Not on file  . Alcohol Use: No  . Drug Use: No  . Sexual Activity: Yes   Social History Narrative   Patient currently lives with his fiance   Works with facilities   Nonsmoker, nondrinker   Current Outpatient Medications on File Prior to Visit  Medication Sig Dispense Refill  . acetaminophen (TYLENOL) 500 MG tablet Take 1 tablet (500 mg total) by mouth every 6 (six) hours as needed for moderate pain. (Patient taking differently: Take 1,000 mg by mouth every 6 (six) hours as needed (for pain). ) 30 tablet 0  . albuterol (PROVENTIL HFA;VENTOLIN HFA)  108 (90 Base) MCG/ACT inhaler Inhale 1 puff into the lungs every 6 (six) hours as needed for wheezing or shortness of breath. (Patient taking differently: Inhale 2 puffs into the lungs every 8 (eight) hours as needed for wheezing or shortness of breath. ) 1 Inhaler 11  . albuterol (PROVENTIL) (2.5 MG/3ML) 0.083% nebulizer solution Take 3 mLs (2.5 mg total) by nebulization every 4 (four) hours as needed for wheezing. 150 mL 2  . albuterol-ipratropium (COMBIVENT) 18-103 MCG/ACT inhaler Inhale 2 puffs into the lungs every 4 (four) hours. (Patient taking differently: Inhale 2 puffs into the lungs 3 (three) times daily as needed for wheezing or shortness of breath. ) 1 Inhaler 11  . ALPRAZolam (XANAX) 0.5 MG tablet Take 2 tablets approximately 45 minutes prior to the MRI study, take a third tablet if needed. 3 tablet 0  . amLODipine (NORVASC) 5 MG tablet Take 1 tablet (5 mg total) by mouth as directed. Take twice a day on dialysis days and once a day on other days. (Patient not taking: Reported on 12/22/2016) 45 tablet 0  . aspirin EC 81 MG EC tablet Take 1 tablet (81 mg  total) by mouth daily.    Marland Kitchen atorvastatin (LIPITOR) 80 MG tablet Take 1 tablet (80 mg total) by mouth daily at 6 PM. 90 tablet 1  . calcitRIOL (ROCALTROL) 0.25 MCG capsule Take 1 capsule (0.25 mcg total) by mouth every Monday, Wednesday, and Friday with hemodialysis. (Patient taking differently: Take 0.25 mcg by mouth See admin instructions. Tuesday Thursday Saturday) 30 capsule 3  . calcium acetate (PHOSLO) 667 MG capsule Take 2 capsules (1,334 mg total) by mouth 3 (three) times daily with meals. (Patient taking differently: Take 1,334-2,001 mg by mouth See admin instructions. Take 2001 mg by mouth 3 times daily with meals and take 1334 mg by mouth with snacks) 180 capsule 0  . cinacalcet (SENSIPAR) 30 MG tablet Take 30 mg by mouth every Monday, Wednesday, and Friday.    . citalopram (CELEXA) 40 MG tablet Take 40 mg by mouth at bedtime.     . clonazePAM (KLONOPIN) 2 MG tablet Take 2 mg by mouth as needed.     . clopidogrel (PLAVIX) 75 MG tablet Take 1 tablet (75 mg total) by mouth daily. 90 tablet 3  . cyclobenzaprine (FLEXERIL) 10 MG tablet Take 1 tablet (10 mg total) 3 times/day as needed-between meals & bedtime by mouth for muscle spasms. 30 tablet 0  . gabapentin (NEURONTIN) 100 MG capsule Take 2 capsules (200 mg total) at bedtime by mouth. 60 capsule 0  . hydrALAZINE (APRESOLINE) 25 MG tablet Take 1 tablet (25 mg total) by mouth 3 (three) times daily as needed (take for blood pressure above 160 if not controlled with home meds). 30 tablet 0  . HYDROcodone-acetaminophen (NORCO/VICODIN) 5-325 MG tablet Take 1-2 tablets by mouth every 4 (four) hours as needed for moderate pain. 60 tablet 0  . HYDROmorphone (DILAUDID) 2 MG tablet Take 2 mg by mouth 3 (three) times daily.    . hydrOXYzine (ATARAX/VISTARIL) 25 MG tablet Take 25 mg by mouth 2 (two) times daily as needed for anxiety.     . insulin glargine (LANTUS) 100 UNIT/ML injection Inject 0.4 mLs (40 Units total) into the skin 2 (two) times daily.  (Patient taking differently: Inject 30 Units into the skin daily. ) 10 mL 0  . insulin lispro (HUMALOG) 100 UNIT/ML injection Inject 0.15-0.3 mLs (15-30 Units total) into the skin every evening. Take one unit per  5 carbs depending on dinner. 10 mL 2  . levothyroxine (SYNTHROID, LEVOTHROID) 75 MCG tablet Take 1 tablet (75 mcg total) by mouth daily before breakfast. 30 tablet 0  . metoCLOPramide (REGLAN) 5 MG tablet Take 1 tablet (5 mg total) by mouth every 8 (eight) hours as needed for nausea or vomiting. (Patient taking differently: Take 10 mg by mouth 3 (three) times daily. ) 30 tablet 0  . metoprolol tartrate (LOPRESSOR) 100 MG tablet Take 1 tab twice a day on dialysis days and once a day on other days. 45 tablet 0   No current facility-administered medications on file prior to visit.    Allergies  Allergen Reactions  . Ibuprofen Other (See Comments)    MD told patient not to take due to kidney disease  . Nsaids Other (See Comments)    Told to avoid all nsaids due to kidney disease   . Tape Other (See Comments)    Welts result, if left for a long amount of time   Family History  Problem Relation Age of Onset  . Obesity Mother        Patient states that family members have no other medical illnesses other than what I have described  . Kidney cancer Maternal Grandmother   . Cancer Father 50       AML  . Heart disease Unknown        No family history   PE: BP 128/90   Pulse (!) 104   Ht 6\' 2"  (1.88 m)   Wt 267 lb 12.8 oz (121.5 kg)   SpO2 96%   BMI 34.38 kg/m  Body mass index is 34.38 kg/m. Wt Readings from Last 3 Encounters:  01/05/17 267 lb 12.8 oz (121.5 kg)  12/22/16 269 lb (122 kg)  11/16/16 270 lb (122.5 kg)   Constitutional: Obese, in NAD Eyes: PERRLA, EOMI, no exophthalmos ENT: moist mucous membranes, no thyromegaly, no cervical lymphadenopathy Cardiovascular: tachycardia, RR, No MRG Respiratory: CTA B Gastrointestinal: abdomen soft, NT, ND, BS+ Musculoskeletal:  no deformities, strength intact in all 4 Skin: moist, warm, no rashes Neurological: no tremor with outstretched hands, DTR normal in all 4  ASSESSMENT: 1. DM2, insulin-dependent, uncontrolled, with complications - CAD, 3x CABG 2017 - DR - PN - nephrotic sd. >> ESRD - HD MWF- [facility] nephrology at [facility], now Dr. Reginal Lutes   2. Hypothyroidism  PLAN:  1. Patient with uncontrolled diabetes, previously on basal-bolus insulin regimen, now recently started Victoza; returning after more than 2 years absence.  His sugars were very high before starting Victoza, now a little better. - We discussed that because of his kidney disease, we do not have many options for antidiabetic treatment.  We can continue Victoza, but he will also need to add back insulin.  Lantus was not covered by his insurance and we discussed about alternatives.  I gave him a list of long-acting insulins and he will check with insurance which ones are covered.  I advised him to let me know if none are covered, in that case, we can change to NPH. - Will also increase the dose of Victoza in an effort to avoid mealtime insulin.  However, we will reevaluate his regimen at next visit, in 1.5 months, and at that time, we may add back Humalog or NovoLog, or, if not covered, regular insulin. - I suggested to:  Patient Instructions  Please restart Lantus 20 units at bedtime. Increase the dose by 4 units every 4 days until sugars in  am <140, or you reach 36 units.  Increase Victoza to 1.8 mg daily before b'fast.  Please check with your insurance if they cover: - Basaglar, Lantus, Levemir, Toujeo, Tresiba (long acting) - Humalog, Novolog, Apidra (rapid acting)   Alternatives for Victoza: - Trulicity (once a week) - Ozempic (once a week) - Bydureon ( once a week) - Byetta (2x a day)  Please restart Levothyroxine 50 mcg daily.  Take the thyroid hormone every day, with water, at least 30 minutes before breakfast, separated by at least  4 hours from: - acid reflux medications - calcium - iron - multivitamins  Please return in 1.5 months with your sugar log.   - today, HbA1c is 11.9% (higher!) - continue checking sugars at different times of the day - check 2-3x a day, rotating checks - advised for yearly eye exams >> he is UTD - Return to clinic in 3 mo with sugar log   2. Hypothyroidism - latest thyroid labs reviewed with pt >> higher than target.  However, since then, he ran out of levothyroxine 50 mcg.  We discussed that he absolutely needs to restart this.  We will recheck his TFTs at next visit and we may need to increase the dose.  He tells me that he is not feeling any change when he is taking the medicine versus when he is not.  He may need a higher dose of levothyroxine, but he will need to start the current dose first and then evaluate for possible need to increase the dose.  He agrees to restart it. - we discussed about taking the thyroid hormone every day, with water, >30 minutes before breakfast, separated by >4 hours from acid reflux medications, calcium, iron, multivitamins. Pt. is taking it correctly. - RTC in 3 mo  Carlus Pavlovristina Khyle Goodell, MD PhD [facility]

## 2017-01-05 NOTE — Progress Notes (Signed)
Cardiology Office Note    Date:  01/07/2017   ID:  Russell [name], DOB [birth year], MRN [id number]  PCP:  [name], MD  Cardiologist:  Dr. Jens Som   Chief Complaint  Patient presents with  . Follow-up    follow up from cath, elevated blood pressure-checking bp 3-4 times a day     History of Present Illness:  Russell [name] is a 47 y.o. male with PMH of asthma, CAD, h/o DVT, ESRD on dialysis MWF, HTN, hypothyroidism, and DM II. Renal Doppler in May 2016 was normal. He had bilateral DVT around 2 years ago and completed 6 months of Xarelto therapy. Echocardiogram in September 2017 showed normal LV function, moderate LVH. He had a cardiac catheterization in September 2017 showed 90% ramus, 95% first diagonal, 80% proximal LAD and normal LV function. He subsequently had CABG with LIMA to LAD, SVG to diagonal, SVG to obtuse marginal. He was last seen by Dr. Jens Som on 01/27/2016, 6 month follow-up was recommended. Dr. Jens Som was thinking about discontinuing his Plavix during the next office visit. He was admitted in May for acute on chronic respiratory failure secondary to fluid overload, this was felt to be secondary to non-adherence to his peritoneal dialysis at home. Echocardiogram showed EF 55%, no wall motion abnormality, grade 2 diastolic dysfunction, moderate pulmonary hypertension. CTA was negative for PE. Unfortunately, he returned to the hospital on 05/23/2016 with recurrent chest pain. The chest pain was felt to be atypical and pleuritic. Initial troponin was negative. A limited echo was repeated on 05/17/2016 to check for prior cardial effusion and rule out pericarditis. Echocardiogram obtained on that day showed EF 55%, grade 2 diastolic dysfunction, mild LVH, poorly visualized right ventricle, PA peak pressure 55 mmHg.  He is evaluated by renal transplant team at [facility]. Transplant team requested echocardiogram and a stress test.  I last saw the patient on  09/12/2016, he was having more chest pain at that time.  Echocardiogram showed EF 45-50%.  Blood pressure was also high, I added hydralazine 25 mg 3 times a day with instruction to hold a in the morning of dialysis.  Cardiac catheterization performed on 09/13/2016 showed 80% ostial ramus, 80% proximal to mid LAD, 90% ostial D1 lesion, patent LIMA to mid LAD, patent SVG to diagonal, patent SVG to intermediate branch, patent RCA and left circumflex artery.  He was admitted in late September with severe hyperkalemia with potassium greater than 7.5 and shortness of breath in the setting of missed hemodialysis treatment while traveling to [place].  TSH was found to be elevated, Synthroid was also increased.  Patient was admitted again in October for dyspnea and chest pain.  This was attributed to hypertensive emergency.  Symptoms improved with hemodialysis.   He presents today for cardiology office visit.  He says he has not been taking Lipitor for the past year nor has he been taking the Plavix.  I think it is okay for him to come off the Plavix at this time since he never had a stent and he had a bypass surgery in 2017.  Recent cardiac catheterization showed patent grafts.  As far as Lipitor, I encouraged him to restart on Lipitor.  On the next follow-up he will need a fasting lipid panel.  Otherwise his blood pressure remain high every morning.  He will take metoprolol 100 mg twice daily and hold the metoprolol the night before dialysis.  Otherwise I encouraged him to use short  acting hydralazine on a as needed basis to help with his blood pressure.  After dialysis today, his blood pressure is 124/78 and that he feels very fatigued.  Aggressively uptitrating his blood pressure medication may not be a good option at this time.   Past Medical History:  Diagnosis Date  . Anxiety   . Asthma   . Coronary artery disease involving native coronary artery of native heart with unstable angina pectoris (HCC)    80%  LAD-95% oD1 bifurcation lesion & 90% RI --> referred for CABG  . Daily headache   . Depression   . DVT (deep venous thrombosis), H/o 01/2014-on Xarelto 03/24/2014   LLE  . ESRD (end stage renal disease) on dialysis [facility])    "[place], MWF" (06/29/2016)  . Gait abnormality 12/22/2016  . Hypertension   . Hypothyroidism   . Nephrotic syndrome 05/18/2014  . Quadriparesis (HCC) 12/22/2016  . Type 2 diabetes mellitus with diabetic nephropathy [facility])     Past Surgical History:  Procedure Laterality Date  . ANKLE FRACTURE SURGERY Right 1988  . AV FISTULA PLACEMENT Left 01/01/2015   Procedure: CREATION OF LEFT RADIAL CEPHALIC ARTERIOVENOUS (AV) FISTULA ;  Surgeon: Pryor Ochoa, MD;  Location: [facility] OR;  Service: Vascular;  Laterality: Left;  . CAPD REMOVAL N/A 05/19/2016   Procedure: PD CATH REMOVAL;  Surgeon: Abigail Miyamoto, MD;  Location: [facility] OR;  Service: General;  Laterality: N/A;  . CARDIAC CATHETERIZATION N/A 09/22/2015   Procedure: Left Heart Cath and Coronary Angiography;  Surgeon: Iran Ouch, MD;  Location: [facility] INVASIVE CV LAB;  Service: Cardiovascular;  Laterality: N/A;  . CORONARY ARTERY BYPASS GRAFT N/A 09/28/2015   Procedure: CORONARY ARTERY BYPASS GRAFTING (CABG) x 3 UTILIZING LEFT MAMMARY ARTERY AND ENDOSCOPICALLY HARVESTED LEFT GREATER SAPHENOUS VEIN.;  Surgeon: Delight Ovens, MD;  Location: [facility] OR;  Service: Open Heart Surgery;  Laterality: N/A;  . ENDOVEIN HARVEST OF GREATER SAPHENOUS VEIN Left 09/28/2015   Procedure: ENDOVEIN HARVEST OF GREATER SAPHENOUS VEIN;  Surgeon: Delight Ovens, MD;  Location: [facility] OR;  Service: Open Heart Surgery;  Laterality: Left;  . ESOPHAGOGASTRODUODENOSCOPY N/A 03/29/2014   Procedure: ESOPHAGOGASTRODUODENOSCOPY (EGD);  Surgeon: Dorena Cookey, MD;  Location: [facility]COPY;  Service: Endoscopy;  Laterality: N/A;  . EYE SURGERY    . FRACTURE SURGERY    . INSERTION OF DIALYSIS CATHETER Right 01/05/2015   Procedure: INSERTION OF RIGHT INTERNAL JUGULAR  DIALYSIS CATHETER;  Surgeon: Fransisco Hertz, MD;  Location: [facility] OR;  Service: Vascular;  Laterality: Right;  . LEFT HEART CATH AND CORS/GRAFTS ANGIOGRAPHY N/A 09/13/2016   Procedure: LEFT HEART CATH AND CORS/GRAFTS ANGIOGRAPHY;  Surgeon: Kathleene Hazel, MD;  Location: [facility] INVASIVE CV LAB;  Service: Cardiovascular;  Laterality: N/A;  . RETINAL LASER PROCEDURE Bilateral   . TEE WITHOUT CARDIOVERSION N/A 09/28/2015   Procedure: TRANSESOPHAGEAL ECHOCARDIOGRAM (TEE);  Surgeon: Delight Ovens, MD;  Location: [facility] OR;  Service: Open Heart Surgery;  Laterality: N/A;  . TONSILLECTOMY AND ADENOIDECTOMY  1970s    Current Medications: Outpatient Medications Prior to Visit  Medication Sig Dispense Refill  . acetaminophen (TYLENOL) 500 MG tablet Take 1 tablet (500 mg total) by mouth every 6 (six) hours as needed for moderate pain. (Patient taking differently: Take 1,000 mg by mouth every 6 (six) hours as needed (for pain). ) 30 tablet 0  . albuterol (PROVENTIL HFA;VENTOLIN HFA) 108 (90 Base) MCG/ACT inhaler Inhale 1 puff into the lungs every 6 (six) hours as needed for wheezing or  shortness of breath. (Patient taking differently: Inhale 2 puffs into the lungs every 8 (eight) hours as needed for wheezing or shortness of breath. ) 1 Inhaler 11  . albuterol (PROVENTIL) (2.5 MG/3ML) 0.083% nebulizer solution Take 3 mLs (2.5 mg total) by nebulization every 4 (four) hours as needed for wheezing. 150 mL 2  . albuterol-ipratropium (COMBIVENT) 18-103 MCG/ACT inhaler Inhale 2 puffs into the lungs every 4 (four) hours. (Patient taking differently: Inhale 2 puffs into the lungs 3 (three) times daily as needed for wheezing or shortness of breath. ) 1 Inhaler 11  . ALPRAZolam (XANAX) 0.5 MG tablet Take 2 tablets approximately 45 minutes prior to the MRI study, take a third tablet if needed. 3 tablet 0  . amLODipine (NORVASC) 5 MG tablet Take 1 tablet (5 mg total) by mouth as directed. Take twice a day on dialysis days and  once a day on other days. 45 tablet 0  . aspirin EC 81 MG EC tablet Take 1 tablet (81 mg total) by mouth daily.    . calcitRIOL (ROCALTROL) 0.25 MCG capsule Take 1 capsule (0.25 mcg total) by mouth every Monday, Wednesday, and Friday with hemodialysis. (Patient taking differently: Take 0.25 mcg by mouth See admin instructions. Tuesday Thursday Saturday) 30 capsule 3  . calcium acetate (PHOSLO) 667 MG capsule Take 2 capsules (1,334 mg total) by mouth 3 (three) times daily with meals. (Patient taking differently: Take 1,334-2,001 mg by mouth See admin instructions. Take 2001 mg by mouth 3 times daily with meals and take 1334 mg by mouth with snacks) 180 capsule 0  . cinacalcet (SENSIPAR) 30 MG tablet Take 30 mg by mouth every Monday, Wednesday, and Friday.    . citalopram (CELEXA) 40 MG tablet Take 40 mg by mouth at bedtime.     . clonazePAM (KLONOPIN) 2 MG tablet Take 2 mg by mouth as needed.     . cyclobenzaprine (FLEXERIL) 10 MG tablet Take 1 tablet (10 mg total) 3 times/day as needed-between meals & bedtime by mouth for muscle spasms. 30 tablet 0  . gabapentin (NEURONTIN) 100 MG capsule Take 2 capsules (200 mg total) at bedtime by mouth. 60 capsule 0  . hydrALAZINE (APRESOLINE) 25 MG tablet Take 1 tablet (25 mg total) by mouth 3 (three) times daily as needed (take for blood pressure above 160 if not controlled with home meds). 30 tablet 0  . HYDROcodone-acetaminophen (NORCO/VICODIN) 5-325 MG tablet Take 1-2 tablets by mouth every 4 (four) hours as needed for moderate pain. 60 tablet 0  . HYDROmorphone (DILAUDID) 2 MG tablet Take 2 mg by mouth 3 (three) times daily.    . hydrOXYzine (ATARAX/VISTARIL) 25 MG tablet Take 25 mg by mouth 2 (two) times daily as needed for anxiety.     . insulin glargine (LANTUS) 100 UNIT/ML injection Inject 0.3 mLs (30 Units total) into the skin at bedtime. Pens 15 mL 3  . insulin lispro (HUMALOG) 100 UNIT/ML injection Inject 0.15-0.3 mLs (15-30 Units total) into the skin  every evening. Take one unit per 5 carbs depending on dinner. 10 mL 2  . levothyroxine (SYNTHROID, LEVOTHROID) 50 MCG tablet Take 1 tablet (50 mcg total) by mouth daily before breakfast. 45 tablet 3  . liraglutide (VICTOZA) 18 MG/3ML SOPN Inject 0.3 mLs (1.8 mg total) into the skin daily before breakfast. 9 pen 3  . metoCLOPramide (REGLAN) 5 MG tablet Take 1 tablet (5 mg total) by mouth every 8 (eight) hours as needed for nausea or vomiting. (Patient taking  differently: Take 10 mg by mouth 3 (three) times daily. ) 30 tablet 0  . atorvastatin (LIPITOR) 80 MG tablet Take 1 tablet (80 mg total) by mouth daily at 6 PM. 90 tablet 1  . clopidogrel (PLAVIX) 75 MG tablet Take 1 tablet (75 mg total) by mouth daily. 90 tablet 3  . metoprolol tartrate (LOPRESSOR) 100 MG tablet Take 1 tab twice a day on dialysis days and once a day on other days. 45 tablet 0   No facility-administered medications prior to visit.      Allergies:   Ibuprofen; Nsaids; and Tape   Social History   Socioeconomic History  . Marital status: Single    Spouse name: None  . Number of children: 0  . Years of education: College  . Highest education level: None  Social Needs  . Financial resource strain: None  . Food insecurity - worry: None  . Food insecurity - inability: None  . Transportation needs - medical: None  . Transportation needs - non-medical: None  Occupational History  . Occupation: Disabled    Comment: Data processing managerMaintenance mechanic  Tobacco Use  . Smoking status: Never Smoker  . Smokeless tobacco: Never Used  Substance and Sexual Activity  . Alcohol use: Yes    Alcohol/week: 0.0 oz    Comment: 06/29/2016 "might have a few drinks/year"  . Drug use: No  . Sexual activity: Yes  Other Topics Concern  . None  Social History Narrative   Patient currently lives with his fiance   Works with facilities   Nonsmoker, nondrinker   Caffeine use: none   Right handed      Family History:  The patient's family history  includes Cancer (age of onset: 5150) in his father; Heart disease in his unknown relative; Kidney cancer in his maternal grandmother; Obesity in his mother.   ROS:   Please see the history of present illness.    ROS All other systems reviewed and are negative.   PHYSICAL EXAM:   VS:  BP 124/78   Pulse (!) 102   Ht 6\' 2"  (1.88 m)   Wt 269 lb (122 kg)   BMI 34.54 kg/m    GEN: Well nourished, well developed, in no acute distress  HEENT: normal  Neck: no JVD, carotid bruits, or masses Cardiac: RRR; no murmurs, rubs, or gallops,no edema  Respiratory:  clear to auscultation bilaterally, normal work of breathing GI: soft, nontender, nondistended, + BS MS: no deformity or atrophy  Skin: warm and dry, no rash Neuro:  Alert and Oriented x 3, Strength and sensation are intact Psych: euthymic mood, full affect  Wt Readings from Last 3 Encounters:  01/05/17 269 lb (122 kg)  01/05/17 267 lb 12.8 oz (121.5 kg)  12/22/16 269 lb (122 kg)      Studies/Labs Reviewed:   EKG:  EKG is not ordered today.   Recent Labs: 05/23/2016: B Natriuretic Peptide 299.0 10/14/2016: Magnesium 1.9; TSH 6.256 10/15/2016: ALT 18; BUN 32; Creatinine, Ser 6.41; Hemoglobin 10.8; Platelets 241; Potassium 4.2; Sodium 133   Lipid Panel    Component Value Date/Time   CHOL 196 05/17/2016 0208   CHOL 228 (H) 04/17/2016 0900   TRIG 246 (H) 05/17/2016 0208   HDL 32 (L) 05/17/2016 0208   HDL 33 (L) 04/17/2016 0900   CHOLHDL 6.1 05/17/2016 0208   VLDL 49 (H) 05/17/2016 0208   LDLCALC 115 (H) 05/17/2016 0208   LDLCALC 126 (H) 04/17/2016 0900    Additional studies/ records that  were reviewed today include:   Cath 09/13/2016 Conclusion     Ost Ramus to Ramus lesion, 99 %stenosed.  Prox LAD to Mid LAD lesion, 80 %stenosed.  Ost 1st Diag to 1st Diag lesion, 90 %stenosed.  SVG graft was visualized by angiography and is normal in caliber and anatomically normal.  SVG graft was visualized by angiography and  is normal in caliber and anatomically normal.  LIMA graft was visualized by angiography and is normal in caliber and anatomically normal.   1. Severe stenosis mid LAD. Patent LIMA graft to mid LAD 2. Severe stenosis moderate caliber first diagonal branch. Patent vein graft to the Diagonal branch 3. Severe stenosis large caliber intermediate branch. Patent vein graft to the intermediate branch. 4. Patent Circumflex artery 5. Patent RCA  Recommendations: Continue medical management of CAD.       ASSESSMENT:    1. Coronary artery disease involving native coronary artery of native heart without angina pectoris   2. Hyperlipidemia, unspecified hyperlipidemia type   3. ESRD on dialysis (HCC)   4. Essential hypertension   5. Controlled type 2 diabetes mellitus without complication, without long-term current use of insulin (HCC)      PLAN:  In order of problems listed above:  1. CAD: Cardiac catheterization in September 2018 showed patent grafts, medical therapy recommended.  He says he self discontinued his Plavix for the past year, since he did not receive any stent for the past year, I did not restart his Plavix.  2. Hypertension: He has significant blood pressure fluctuations, I recommend him to continue on the metoprolol and use hydralazine on a as needed basis for systolic blood pressure greater than 160.  3. Hyperlipidemia: restart on 80 mg daily, apparently he has not been taking Lipitor for close to a year now, need to repeat his lipid panel.  Last LDL 115 on 05/17/2016.  4. DM II: On insulin, managed by care provider  5. ESRD on Monday Wednesday Friday: He has had recent admission due to missed dialysis session when he went to [place].  Better compliance needed    Medication Adjustments/Labs and Tests Ordered: Current medicines are reviewed at length with the patient today.  Concerns regarding medicines are outlined above.  Medication changes, Labs and Tests ordered  today are listed in the Patient Instructions below. Patient Instructions  Medication Instructions:  RESTART- Atorvastatin 80 mg daily STOP- Plavix  If you need a refill on your cardiac medications before your next appointment, please call your pharmacy.  Labwork: None Ordered   Testing/Procedures: None Ordered   Follow-Up: Your physician wants you to follow-up in: 6 Months with Dr Jens Som.    Thank you for choosing [facility] at [employer], [place]  01/07/2017 11:03 AM    [facility] [address] [place], [place], [place]  [address] Phone: [phone]; Fax: [phone]

## 2017-01-05 NOTE — Patient Instructions (Addendum)
Medication Instructions:  RESTART- Atorvastatin 80 mg daily STOP- Plavix  If you need a refill on your cardiac medications before your next appointment, please call your pharmacy.  Labwork: None Ordered   Testing/Procedures: None Ordered   Follow-Up: Your physician wants you to follow-up in: 6 Months with Dr Jens Somrenshaw.    Thank you for choosing [facility]!!

## 2017-01-05 NOTE — Addendum Note (Signed)
Addended by: Yolande JollyLAWSON, Stephanine Reas on: 01/05/2017 11:37 AM   Modules accepted: Orders

## 2017-01-05 NOTE — Patient Instructions (Addendum)
Please restart Lantus 20 units at bedtime. Increase the dose by 4 units every 4 days until sugars in am <140, or you reach 36 units.  Increase Victoza to 1.8 mg daily before b'fast.  Please check with your insurance if they cover: - Basaglar, Lantus, Levemir, Toujeo, Tresiba (long acting) - Humalog, Novolog, Apidra (rapid acting)   Alternatives for Victoza: - Trulicity (once a week) - Ozempic (once a week) - Bydureon ( once a week) - Byetta (2x a day)  Please restart Levothyroxine 50 mcg daily.  Take the thyroid hormone every day, with water, at least 30 minutes before breakfast, separated by at least 4 hours from: - acid reflux medications - calcium - iron - multivitamins  Please return in 1.5 months with your sugar log.

## 2017-01-07 ENCOUNTER — Encounter: Payer: Self-pay | Admitting: Physician Assistant

## 2017-01-08 DIAGNOSIS — N186 End stage renal disease: Secondary | ICD-10-CM | POA: Diagnosis not present

## 2017-01-08 DIAGNOSIS — D509 Iron deficiency anemia, unspecified: Secondary | ICD-10-CM | POA: Diagnosis not present

## 2017-01-08 DIAGNOSIS — N2581 Secondary hyperparathyroidism of renal origin: Secondary | ICD-10-CM | POA: Diagnosis not present

## 2017-01-08 DIAGNOSIS — E875 Hyperkalemia: Secondary | ICD-10-CM | POA: Diagnosis not present

## 2017-01-08 DIAGNOSIS — E1129 Type 2 diabetes mellitus with other diabetic kidney complication: Secondary | ICD-10-CM | POA: Diagnosis not present

## 2017-01-08 DIAGNOSIS — D631 Anemia in chronic kidney disease: Secondary | ICD-10-CM | POA: Diagnosis not present

## 2017-01-09 DIAGNOSIS — I120 Hypertensive chronic kidney disease with stage 5 chronic kidney disease or end stage renal disease: Secondary | ICD-10-CM | POA: Diagnosis not present

## 2017-01-09 DIAGNOSIS — N186 End stage renal disease: Secondary | ICD-10-CM | POA: Diagnosis not present

## 2017-01-09 DIAGNOSIS — E1122 Type 2 diabetes mellitus with diabetic chronic kidney disease: Secondary | ICD-10-CM | POA: Diagnosis not present

## 2017-01-09 DIAGNOSIS — I2510 Atherosclerotic heart disease of native coronary artery without angina pectoris: Secondary | ICD-10-CM | POA: Diagnosis not present

## 2017-01-09 DIAGNOSIS — D631 Anemia in chronic kidney disease: Secondary | ICD-10-CM | POA: Diagnosis not present

## 2017-01-09 DIAGNOSIS — E1151 Type 2 diabetes mellitus with diabetic peripheral angiopathy without gangrene: Secondary | ICD-10-CM | POA: Diagnosis not present

## 2017-01-10 ENCOUNTER — Ambulatory Visit (INDEPENDENT_AMBULATORY_CARE_PROVIDER_SITE_OTHER): Payer: Medicare Other | Admitting: Neurology

## 2017-01-10 ENCOUNTER — Telehealth: Payer: Self-pay | Admitting: Neurology

## 2017-01-10 ENCOUNTER — Encounter: Payer: Self-pay | Admitting: Neurology

## 2017-01-10 DIAGNOSIS — G8250 Quadriplegia, unspecified: Secondary | ICD-10-CM

## 2017-01-10 DIAGNOSIS — G6181 Chronic inflammatory demyelinating polyneuritis: Secondary | ICD-10-CM

## 2017-01-10 DIAGNOSIS — E1142 Type 2 diabetes mellitus with diabetic polyneuropathy: Secondary | ICD-10-CM | POA: Diagnosis not present

## 2017-01-10 DIAGNOSIS — D631 Anemia in chronic kidney disease: Secondary | ICD-10-CM | POA: Diagnosis not present

## 2017-01-10 DIAGNOSIS — E1129 Type 2 diabetes mellitus with other diabetic kidney complication: Secondary | ICD-10-CM | POA: Diagnosis not present

## 2017-01-10 DIAGNOSIS — N186 End stage renal disease: Secondary | ICD-10-CM | POA: Diagnosis not present

## 2017-01-10 DIAGNOSIS — R269 Unspecified abnormalities of gait and mobility: Secondary | ICD-10-CM

## 2017-01-10 DIAGNOSIS — D509 Iron deficiency anemia, unspecified: Secondary | ICD-10-CM | POA: Diagnosis not present

## 2017-01-10 DIAGNOSIS — E875 Hyperkalemia: Secondary | ICD-10-CM | POA: Diagnosis not present

## 2017-01-10 DIAGNOSIS — N2581 Secondary hyperparathyroidism of renal origin: Secondary | ICD-10-CM | POA: Diagnosis not present

## 2017-01-10 HISTORY — DX: Chronic inflammatory demyelinating polyneuritis: G61.81

## 2017-01-10 NOTE — Telephone Encounter (Signed)
Faxed order to [facility] they will contact patient to schedule.

## 2017-01-10 NOTE — Progress Notes (Signed)
Please refer to EMG and nerve conduction study procedure note. 

## 2017-01-10 NOTE — Progress Notes (Addendum)
[name] comes in today for EMG nerve conduction study evaluation.  The patient continues to indicate that he is progressively worsening with his strength of all 4 extremities, even since last seen.  The patient is getting occupational therapy at home, physical therapy dropped out because of nonprogression.  Nerve conduction study shows evidence of a severe diabetic peripheral neuropathy, but nerve conduction of the upper extremities reveals demyelinating features of the neuropathy.  Blood work has shown a modest elevation of CK enzyme levels otherwise unremarkable.  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  On the EMG clinically weak muscles are not denervated in the arms and in the proximal legs.  Only active denervation is seen below the knees and is associated with a diabetic length dependent neuropathy.  We will get the patient set up for CT of the brain prior to the lumbar puncture to confirm elevation of spinal fluid protein levels.  The patient is getting advanced home care to the home now, we will see about getting them to initiate IVIG therapy for this patient.     MNC    Nerve / Sites Muscle Latency Ref. Amplitude Ref. Rel Amp Segments Distance Velocity Ref. Area    ms ms mV mV %  cm m/s m/s mVms  L Median - APB     Wrist APB 6.8 ?4.4 3.9 ?4.0 100 Wrist - APB 7   12.8     Upper arm APB 16.7  3.5  89.1 Upper arm - Wrist 25 25 ?49 12.4  R Median - APB     Wrist APB 6.9 ?4.4 1.3 ?4.0 100 Wrist - APB 7   5.8     Upper arm APB 14.0  1.5  115 Upper arm - Wrist 26 37 ?49 9.8  L Ulnar - ADM     Wrist ADM 4.3 ?3.3 0.8 ?6.0 100 Wrist - ADM 7   3.6     B.Elbow ADM 14.5  0.3  32.6 B.Elbow - Wrist 22 22 ?49 1.6     A.Elbow ADM 19.1  0.2  89.7 A.Elbow - B.Elbow 10 22 ?49 1.2         A.Elbow - Wrist      R Ulnar - ADM     Wrist ADM 1.8 ?3.3 3.2 ?6.0 100 Wrist - ADM 7    19.4     B.Elbow ADM 12.1  0.9  28.8 B.Elbow - Wrist 23 22 ?49 4.2     A.Elbow ADM 15.2  0.9  97.8 A.Elbow - B.Elbow 10 33 ?49 4.0         A.Elbow - Wrist      L Tibial - AH     Ankle AH NR ?5.8 NR ?4.0 NR Ankle - AH 9   NR     Pop fossa AH NR  NR  NR Pop fossa - Ankle   ?41 NR                 SNC    Nerve / Sites Rec. Site Peak Lat Ref.  Amp Ref. Segments Distance    ms ms V V  cm  L Superficial peroneal - Ankle     Lat leg Ankle NR ?4.4 NR ?6 Lat leg - Ankle 14  L Median - Orthodromic (Dig II, Mid palm)     Dig II Wrist NR ?3.4 NR ?  10 Dig II - Wrist 13  L Ulnar - Orthodromic, (Dig V, Mid palm)     Dig V Wrist NR ?3.1 NR ?5 Dig V - Wrist 11           SNC    Nerve / Sites Rec. Site Peak Lat Amp Segments Distance    ms V  cm  R Median - Orthodromic (Dig II, Mid palm)     Dig II Wrist NR NR Dig II - Wrist 13  R Ulnar - Orthodromic, (Dig V, Mid palm)     Dig V Wrist NR NR Dig V - Wrist 11         F  Wave    Nerve F Lat Ref.   ms ms  L Median - APB NR ?31.0  L Ulnar - ADM NR ?32.0  R Median - APB NR ?31.0  R Ulnar - ADM NR ?32.0             EMG

## 2017-01-10 NOTE — Procedures (Signed)
HISTORY:  Russell [name] is a 47 year old gentleman with a history of diabetes that has been under poor control, he has end-stage renal disease on hemodialysis.  The patient has had onset of a progressive quadriparesis that began in October 2018 and has continued.  The patient is being evaluated for this issue.  NERVE CONDUCTION STUDIES:  Nerve conduction studies were performed on both upper extremities.  The distal motor latencies for the median nerves were significantly prolonged bilaterally with a mildly reduced amplitude on the left median nerve, more significantly reduced amplitude on the right median nerve.  The nerve conduction velocities for the median nerves were significantly slowed bilaterally.  The distal motor latency for the left ulnar nerve was prolonged with a low motor amplitude.  The distal motor latency for the right ulnar nerve was normal but with a low motor amplitude.  Significant slowing was seen for these nerves bilaterally.  The sensory latencies for the median and ulnar nerves were unobtainable bilaterally.  The F-wave latencies for the median and ulnar nerves were unobtainable bilaterally.  Nerve conduction studies were performed on the left lower extremity.  No response was seen for the posterior tibial nerve or for the peroneal nerve.  The peroneal sensory latency was unobtainable.  EMG STUDIES:  EMG study was performed on the left upper extremity:  The first dorsal interosseous muscle reveals 2 to 3 K units with decreased recruitment. No fibrillations or positive waves were noted.  Polyphasic motor units were seen The abductor pollicis brevis muscle reveals 2 to 3 K units with decreased recruitment. No fibrillations or positive waves were noted.  Polyphasic motor units were seen, 2+ fasciculations were noted. The pronator teres muscle reveals 2 to 3 K units with slightly decreased recruitment. No fibrillations or positive waves were noted. The biceps muscle  reveals 1 to 2 K units with decreased recruitment. No fibrillations or positive waves were noted.  Polyphasic motor units were seen The triceps muscle reveals 2 to 3 K units with decreased recruitment. No fibrillations or positive waves were noted.  Polyphasic motor units were seen The anterior deltoid muscle reveals 2 to 3 K units with full recruitment. No fibrillations or positive waves were noted. The cervical paraspinal muscles were tested at 2 levels. No abnormalities of insertional activity were seen at either level tested. There was good relaxation.  EMG study was performed on the left lower extremity:  The tibialis anterior muscle reveals 2 to 3K motor units with significantly decreased recruitment. 3+ fibrillations and positive waves were seen. The peroneus tertius muscle reveals 1 to 2K motor units with markedly decreased recruitment. 3+ fibrillations and positive waves were seen. The medial gastrocnemius muscle reveals 1 to 2K motor units with significantly decreased recruitment. 2+ fibrillations and positive waves were seen. The vastus lateralis muscle reveals 2 to 3K motor units with decreased recruitment. No fibrillations or positive waves were seen. The iliopsoas muscle reveals 2 to 3K motor units with decreased recruitment. No fibrillations or positive waves were seen. The biceps femoris muscle (long head) reveals 2 to 3K motor units with decreased recruitment. No fibrillations or positive waves were seen. The lumbosacral paraspinal muscles were tested at 3 levels, and revealed no abnormalities of insertional activity at all 3 levels tested. There was good relaxation.    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  However, the studies  of the upper extremities show severe slowing bilaterally suggestive of a demyelinating neuropathy.  EMG  evaluation of the left upper extremity shows no active denervation in clinically weak muscles again consistent with a demyelinating process.  In the left lower extremity, the EMG showed distal significant denervation consistent with the axonal length-dependent diabetic neuropathy.  Proximally, no denervation was seen even in clinically weak muscles.  Overall, this study shows evidence of a severe axonal peripheral neuropathy associated with diabetes, there is a suggestion however of an overlying demyelinating neuropathy consistent with CIDP.  Clinical correlation is required.  Russell [name]. Russell Daveon Arpino MD 01/10/2017 1:58 PM  [facility] [address] [place], [place] [id number]  Phone [phone] Fax [phone]

## 2017-01-11 DIAGNOSIS — D631 Anemia in chronic kidney disease: Secondary | ICD-10-CM | POA: Diagnosis not present

## 2017-01-11 DIAGNOSIS — E1151 Type 2 diabetes mellitus with diabetic peripheral angiopathy without gangrene: Secondary | ICD-10-CM | POA: Diagnosis not present

## 2017-01-11 DIAGNOSIS — I2510 Atherosclerotic heart disease of native coronary artery without angina pectoris: Secondary | ICD-10-CM | POA: Diagnosis not present

## 2017-01-11 DIAGNOSIS — I120 Hypertensive chronic kidney disease with stage 5 chronic kidney disease or end stage renal disease: Secondary | ICD-10-CM | POA: Diagnosis not present

## 2017-01-11 DIAGNOSIS — N186 End stage renal disease: Secondary | ICD-10-CM | POA: Diagnosis not present

## 2017-01-11 DIAGNOSIS — E1122 Type 2 diabetes mellitus with diabetic chronic kidney disease: Secondary | ICD-10-CM | POA: Diagnosis not present

## 2017-01-12 DIAGNOSIS — N2581 Secondary hyperparathyroidism of renal origin: Secondary | ICD-10-CM | POA: Diagnosis not present

## 2017-01-12 DIAGNOSIS — N186 End stage renal disease: Secondary | ICD-10-CM | POA: Diagnosis not present

## 2017-01-12 DIAGNOSIS — E875 Hyperkalemia: Secondary | ICD-10-CM | POA: Diagnosis not present

## 2017-01-12 DIAGNOSIS — E1129 Type 2 diabetes mellitus with other diabetic kidney complication: Secondary | ICD-10-CM | POA: Diagnosis not present

## 2017-01-12 DIAGNOSIS — D631 Anemia in chronic kidney disease: Secondary | ICD-10-CM | POA: Diagnosis not present

## 2017-01-12 DIAGNOSIS — D509 Iron deficiency anemia, unspecified: Secondary | ICD-10-CM | POA: Diagnosis not present

## 2017-01-15 DIAGNOSIS — N2581 Secondary hyperparathyroidism of renal origin: Secondary | ICD-10-CM | POA: Diagnosis not present

## 2017-01-15 DIAGNOSIS — D509 Iron deficiency anemia, unspecified: Secondary | ICD-10-CM | POA: Diagnosis not present

## 2017-01-15 DIAGNOSIS — N186 End stage renal disease: Secondary | ICD-10-CM | POA: Diagnosis not present

## 2017-01-15 DIAGNOSIS — D631 Anemia in chronic kidney disease: Secondary | ICD-10-CM | POA: Diagnosis not present

## 2017-01-15 DIAGNOSIS — E1129 Type 2 diabetes mellitus with other diabetic kidney complication: Secondary | ICD-10-CM | POA: Diagnosis not present

## 2017-01-15 DIAGNOSIS — E875 Hyperkalemia: Secondary | ICD-10-CM | POA: Diagnosis not present

## 2017-01-15 NOTE — Telephone Encounter (Signed)
Patient is scheduled at [facility] on 01/16/17.

## 2017-01-16 DIAGNOSIS — E1151 Type 2 diabetes mellitus with diabetic peripheral angiopathy without gangrene: Secondary | ICD-10-CM | POA: Diagnosis not present

## 2017-01-16 DIAGNOSIS — D631 Anemia in chronic kidney disease: Secondary | ICD-10-CM | POA: Diagnosis not present

## 2017-01-16 DIAGNOSIS — I120 Hypertensive chronic kidney disease with stage 5 chronic kidney disease or end stage renal disease: Secondary | ICD-10-CM | POA: Diagnosis not present

## 2017-01-16 DIAGNOSIS — N186 End stage renal disease: Secondary | ICD-10-CM | POA: Diagnosis not present

## 2017-01-16 DIAGNOSIS — E1122 Type 2 diabetes mellitus with diabetic chronic kidney disease: Secondary | ICD-10-CM | POA: Diagnosis not present

## 2017-01-16 DIAGNOSIS — I2510 Atherosclerotic heart disease of native coronary artery without angina pectoris: Secondary | ICD-10-CM | POA: Diagnosis not present

## 2017-01-17 DIAGNOSIS — E1129 Type 2 diabetes mellitus with other diabetic kidney complication: Secondary | ICD-10-CM | POA: Diagnosis not present

## 2017-01-17 DIAGNOSIS — N2581 Secondary hyperparathyroidism of renal origin: Secondary | ICD-10-CM | POA: Diagnosis not present

## 2017-01-17 DIAGNOSIS — N186 End stage renal disease: Secondary | ICD-10-CM | POA: Diagnosis not present

## 2017-01-17 DIAGNOSIS — E875 Hyperkalemia: Secondary | ICD-10-CM | POA: Diagnosis not present

## 2017-01-17 DIAGNOSIS — D631 Anemia in chronic kidney disease: Secondary | ICD-10-CM | POA: Diagnosis not present

## 2017-01-17 DIAGNOSIS — D509 Iron deficiency anemia, unspecified: Secondary | ICD-10-CM | POA: Diagnosis not present

## 2017-01-18 DIAGNOSIS — E1122 Type 2 diabetes mellitus with diabetic chronic kidney disease: Secondary | ICD-10-CM | POA: Diagnosis not present

## 2017-01-18 DIAGNOSIS — I2510 Atherosclerotic heart disease of native coronary artery without angina pectoris: Secondary | ICD-10-CM | POA: Diagnosis not present

## 2017-01-18 DIAGNOSIS — I120 Hypertensive chronic kidney disease with stage 5 chronic kidney disease or end stage renal disease: Secondary | ICD-10-CM | POA: Diagnosis not present

## 2017-01-18 DIAGNOSIS — E1151 Type 2 diabetes mellitus with diabetic peripheral angiopathy without gangrene: Secondary | ICD-10-CM | POA: Diagnosis not present

## 2017-01-18 DIAGNOSIS — N186 End stage renal disease: Secondary | ICD-10-CM | POA: Diagnosis not present

## 2017-01-18 DIAGNOSIS — D631 Anemia in chronic kidney disease: Secondary | ICD-10-CM | POA: Diagnosis not present

## 2017-01-19 DIAGNOSIS — E875 Hyperkalemia: Secondary | ICD-10-CM | POA: Diagnosis not present

## 2017-01-19 DIAGNOSIS — D509 Iron deficiency anemia, unspecified: Secondary | ICD-10-CM | POA: Diagnosis not present

## 2017-01-19 DIAGNOSIS — N2581 Secondary hyperparathyroidism of renal origin: Secondary | ICD-10-CM | POA: Diagnosis not present

## 2017-01-19 DIAGNOSIS — E1129 Type 2 diabetes mellitus with other diabetic kidney complication: Secondary | ICD-10-CM | POA: Diagnosis not present

## 2017-01-19 DIAGNOSIS — N186 End stage renal disease: Secondary | ICD-10-CM | POA: Diagnosis not present

## 2017-01-19 DIAGNOSIS — D631 Anemia in chronic kidney disease: Secondary | ICD-10-CM | POA: Diagnosis not present

## 2017-01-23 DIAGNOSIS — N186 End stage renal disease: Secondary | ICD-10-CM | POA: Diagnosis not present

## 2017-01-23 DIAGNOSIS — E1122 Type 2 diabetes mellitus with diabetic chronic kidney disease: Secondary | ICD-10-CM | POA: Diagnosis not present

## 2017-01-23 DIAGNOSIS — E1129 Type 2 diabetes mellitus with other diabetic kidney complication: Secondary | ICD-10-CM | POA: Diagnosis not present

## 2017-01-23 DIAGNOSIS — E1151 Type 2 diabetes mellitus with diabetic peripheral angiopathy without gangrene: Secondary | ICD-10-CM | POA: Diagnosis not present

## 2017-01-23 DIAGNOSIS — D509 Iron deficiency anemia, unspecified: Secondary | ICD-10-CM | POA: Diagnosis not present

## 2017-01-23 DIAGNOSIS — N2581 Secondary hyperparathyroidism of renal origin: Secondary | ICD-10-CM | POA: Diagnosis not present

## 2017-01-23 DIAGNOSIS — I2510 Atherosclerotic heart disease of native coronary artery without angina pectoris: Secondary | ICD-10-CM | POA: Diagnosis not present

## 2017-01-23 DIAGNOSIS — D631 Anemia in chronic kidney disease: Secondary | ICD-10-CM | POA: Diagnosis not present

## 2017-01-23 DIAGNOSIS — I120 Hypertensive chronic kidney disease with stage 5 chronic kidney disease or end stage renal disease: Secondary | ICD-10-CM | POA: Diagnosis not present

## 2017-01-23 DIAGNOSIS — E875 Hyperkalemia: Secondary | ICD-10-CM | POA: Diagnosis not present

## 2017-01-24 DIAGNOSIS — N186 End stage renal disease: Secondary | ICD-10-CM | POA: Diagnosis not present

## 2017-01-24 DIAGNOSIS — E1129 Type 2 diabetes mellitus with other diabetic kidney complication: Secondary | ICD-10-CM | POA: Diagnosis not present

## 2017-01-24 DIAGNOSIS — E875 Hyperkalemia: Secondary | ICD-10-CM | POA: Diagnosis not present

## 2017-01-24 DIAGNOSIS — N2581 Secondary hyperparathyroidism of renal origin: Secondary | ICD-10-CM | POA: Diagnosis not present

## 2017-01-24 DIAGNOSIS — D631 Anemia in chronic kidney disease: Secondary | ICD-10-CM | POA: Diagnosis not present

## 2017-01-24 DIAGNOSIS — D509 Iron deficiency anemia, unspecified: Secondary | ICD-10-CM | POA: Diagnosis not present

## 2017-01-25 DIAGNOSIS — I120 Hypertensive chronic kidney disease with stage 5 chronic kidney disease or end stage renal disease: Secondary | ICD-10-CM | POA: Diagnosis not present

## 2017-01-25 DIAGNOSIS — E1151 Type 2 diabetes mellitus with diabetic peripheral angiopathy without gangrene: Secondary | ICD-10-CM | POA: Diagnosis not present

## 2017-01-25 DIAGNOSIS — E1122 Type 2 diabetes mellitus with diabetic chronic kidney disease: Secondary | ICD-10-CM | POA: Diagnosis not present

## 2017-01-25 DIAGNOSIS — I2510 Atherosclerotic heart disease of native coronary artery without angina pectoris: Secondary | ICD-10-CM | POA: Diagnosis not present

## 2017-01-25 DIAGNOSIS — R269 Unspecified abnormalities of gait and mobility: Secondary | ICD-10-CM | POA: Diagnosis not present

## 2017-01-25 DIAGNOSIS — D631 Anemia in chronic kidney disease: Secondary | ICD-10-CM | POA: Diagnosis not present

## 2017-01-25 DIAGNOSIS — N186 End stage renal disease: Secondary | ICD-10-CM | POA: Diagnosis not present

## 2017-01-26 DIAGNOSIS — N2581 Secondary hyperparathyroidism of renal origin: Secondary | ICD-10-CM | POA: Diagnosis not present

## 2017-01-26 DIAGNOSIS — D631 Anemia in chronic kidney disease: Secondary | ICD-10-CM | POA: Diagnosis not present

## 2017-01-26 DIAGNOSIS — E1129 Type 2 diabetes mellitus with other diabetic kidney complication: Secondary | ICD-10-CM | POA: Diagnosis not present

## 2017-01-26 DIAGNOSIS — D509 Iron deficiency anemia, unspecified: Secondary | ICD-10-CM | POA: Diagnosis not present

## 2017-01-26 DIAGNOSIS — N186 End stage renal disease: Secondary | ICD-10-CM | POA: Diagnosis not present

## 2017-01-26 DIAGNOSIS — E875 Hyperkalemia: Secondary | ICD-10-CM | POA: Diagnosis not present

## 2017-01-29 ENCOUNTER — Telehealth: Payer: Self-pay | Admitting: Neurology

## 2017-01-29 DIAGNOSIS — E875 Hyperkalemia: Secondary | ICD-10-CM | POA: Diagnosis not present

## 2017-01-29 DIAGNOSIS — E1129 Type 2 diabetes mellitus with other diabetic kidney complication: Secondary | ICD-10-CM | POA: Diagnosis not present

## 2017-01-29 DIAGNOSIS — N2581 Secondary hyperparathyroidism of renal origin: Secondary | ICD-10-CM | POA: Diagnosis not present

## 2017-01-29 DIAGNOSIS — N186 End stage renal disease: Secondary | ICD-10-CM | POA: Diagnosis not present

## 2017-01-29 DIAGNOSIS — D509 Iron deficiency anemia, unspecified: Secondary | ICD-10-CM | POA: Diagnosis not present

## 2017-01-29 DIAGNOSIS — D631 Anemia in chronic kidney disease: Secondary | ICD-10-CM | POA: Diagnosis not present

## 2017-01-29 NOTE — Telephone Encounter (Signed)
CT of the head is unremarkable, the patient is okay to have the spinal tap.

## 2017-01-30 DIAGNOSIS — E1122 Type 2 diabetes mellitus with diabetic chronic kidney disease: Secondary | ICD-10-CM | POA: Diagnosis not present

## 2017-01-30 DIAGNOSIS — I120 Hypertensive chronic kidney disease with stage 5 chronic kidney disease or end stage renal disease: Secondary | ICD-10-CM | POA: Diagnosis not present

## 2017-01-30 DIAGNOSIS — N186 End stage renal disease: Secondary | ICD-10-CM | POA: Diagnosis not present

## 2017-01-30 DIAGNOSIS — E1151 Type 2 diabetes mellitus with diabetic peripheral angiopathy without gangrene: Secondary | ICD-10-CM | POA: Diagnosis not present

## 2017-01-30 DIAGNOSIS — D631 Anemia in chronic kidney disease: Secondary | ICD-10-CM | POA: Diagnosis not present

## 2017-01-30 DIAGNOSIS — I2510 Atherosclerotic heart disease of native coronary artery without angina pectoris: Secondary | ICD-10-CM | POA: Diagnosis not present

## 2017-01-31 DIAGNOSIS — D509 Iron deficiency anemia, unspecified: Secondary | ICD-10-CM | POA: Diagnosis not present

## 2017-01-31 DIAGNOSIS — N186 End stage renal disease: Secondary | ICD-10-CM | POA: Diagnosis not present

## 2017-01-31 DIAGNOSIS — D631 Anemia in chronic kidney disease: Secondary | ICD-10-CM | POA: Diagnosis not present

## 2017-01-31 DIAGNOSIS — E1129 Type 2 diabetes mellitus with other diabetic kidney complication: Secondary | ICD-10-CM | POA: Diagnosis not present

## 2017-01-31 DIAGNOSIS — N2581 Secondary hyperparathyroidism of renal origin: Secondary | ICD-10-CM | POA: Diagnosis not present

## 2017-01-31 DIAGNOSIS — E875 Hyperkalemia: Secondary | ICD-10-CM | POA: Diagnosis not present

## 2017-02-01 DIAGNOSIS — Z992 Dependence on renal dialysis: Secondary | ICD-10-CM | POA: Diagnosis not present

## 2017-02-01 DIAGNOSIS — N049 Nephrotic syndrome with unspecified morphologic changes: Secondary | ICD-10-CM | POA: Diagnosis not present

## 2017-02-01 DIAGNOSIS — I871 Compression of vein: Secondary | ICD-10-CM | POA: Diagnosis not present

## 2017-02-01 DIAGNOSIS — T82858A Stenosis of vascular prosthetic devices, implants and grafts, initial encounter: Secondary | ICD-10-CM | POA: Diagnosis not present

## 2017-02-01 DIAGNOSIS — N186 End stage renal disease: Secondary | ICD-10-CM | POA: Diagnosis not present

## 2017-02-02 DIAGNOSIS — D631 Anemia in chronic kidney disease: Secondary | ICD-10-CM | POA: Diagnosis not present

## 2017-02-02 DIAGNOSIS — N186 End stage renal disease: Secondary | ICD-10-CM | POA: Diagnosis not present

## 2017-02-02 DIAGNOSIS — R911 Solitary pulmonary nodule: Secondary | ICD-10-CM | POA: Diagnosis not present

## 2017-02-02 DIAGNOSIS — D509 Iron deficiency anemia, unspecified: Secondary | ICD-10-CM | POA: Diagnosis not present

## 2017-02-02 DIAGNOSIS — E875 Hyperkalemia: Secondary | ICD-10-CM | POA: Diagnosis not present

## 2017-02-02 DIAGNOSIS — E1129 Type 2 diabetes mellitus with other diabetic kidney complication: Secondary | ICD-10-CM | POA: Diagnosis not present

## 2017-02-02 DIAGNOSIS — N2581 Secondary hyperparathyroidism of renal origin: Secondary | ICD-10-CM | POA: Diagnosis not present

## 2017-02-02 DIAGNOSIS — R0602 Shortness of breath: Secondary | ICD-10-CM | POA: Diagnosis not present

## 2017-02-02 DIAGNOSIS — R05 Cough: Secondary | ICD-10-CM | POA: Diagnosis not present

## 2017-02-02 DIAGNOSIS — N049 Nephrotic syndrome with unspecified morphologic changes: Secondary | ICD-10-CM | POA: Diagnosis not present

## 2017-02-02 DIAGNOSIS — Z992 Dependence on renal dialysis: Secondary | ICD-10-CM | POA: Diagnosis not present

## 2017-02-02 DIAGNOSIS — J189 Pneumonia, unspecified organism: Secondary | ICD-10-CM | POA: Diagnosis not present

## 2017-02-05 DIAGNOSIS — N2581 Secondary hyperparathyroidism of renal origin: Secondary | ICD-10-CM | POA: Diagnosis not present

## 2017-02-05 DIAGNOSIS — R911 Solitary pulmonary nodule: Secondary | ICD-10-CM | POA: Diagnosis not present

## 2017-02-05 DIAGNOSIS — R0602 Shortness of breath: Secondary | ICD-10-CM | POA: Diagnosis not present

## 2017-02-05 DIAGNOSIS — D509 Iron deficiency anemia, unspecified: Secondary | ICD-10-CM | POA: Diagnosis not present

## 2017-02-05 DIAGNOSIS — E1129 Type 2 diabetes mellitus with other diabetic kidney complication: Secondary | ICD-10-CM | POA: Diagnosis not present

## 2017-02-05 DIAGNOSIS — R05 Cough: Secondary | ICD-10-CM | POA: Diagnosis not present

## 2017-02-05 DIAGNOSIS — N186 End stage renal disease: Secondary | ICD-10-CM | POA: Diagnosis not present

## 2017-02-05 DIAGNOSIS — J189 Pneumonia, unspecified organism: Secondary | ICD-10-CM | POA: Diagnosis not present

## 2017-02-05 DIAGNOSIS — D631 Anemia in chronic kidney disease: Secondary | ICD-10-CM | POA: Diagnosis not present

## 2017-02-06 ENCOUNTER — Other Ambulatory Visit (HOSPITAL_COMMUNITY): Payer: Self-pay | Admitting: Nephrology

## 2017-02-06 ENCOUNTER — Telehealth: Payer: Self-pay | Admitting: Neurology

## 2017-02-06 ENCOUNTER — Ambulatory Visit
Admission: RE | Admit: 2017-02-06 | Discharge: 2017-02-06 | Disposition: A | Discharge status: Home / Self Care | Payer: Medicare Other | Source: Ambulatory Visit | Attending: Neurology | Admitting: Neurology

## 2017-02-06 VITALS — BP 167/95 | HR 83

## 2017-02-06 DIAGNOSIS — G6181 Chronic inflammatory demyelinating polyneuritis: Secondary | ICD-10-CM | POA: Diagnosis not present

## 2017-02-06 DIAGNOSIS — R269 Unspecified abnormalities of gait and mobility: Secondary | ICD-10-CM

## 2017-02-06 DIAGNOSIS — E785 Hyperlipidemia, unspecified: Secondary | ICD-10-CM | POA: Insufficient documentation

## 2017-02-06 DIAGNOSIS — G8250 Quadriplegia, unspecified: Secondary | ICD-10-CM | POA: Diagnosis not present

## 2017-02-06 DIAGNOSIS — Q631 Lobulated, fused and horseshoe kidney: Secondary | ICD-10-CM | POA: Insufficient documentation

## 2017-02-06 DIAGNOSIS — J45909 Unspecified asthma, uncomplicated: Secondary | ICD-10-CM | POA: Insufficient documentation

## 2017-02-06 DIAGNOSIS — R911 Solitary pulmonary nodule: Secondary | ICD-10-CM

## 2017-02-06 DIAGNOSIS — R0602 Shortness of breath: Secondary | ICD-10-CM

## 2017-02-06 DIAGNOSIS — H3530 Unspecified macular degeneration: Secondary | ICD-10-CM | POA: Insufficient documentation

## 2017-02-06 DIAGNOSIS — F419 Anxiety disorder, unspecified: Secondary | ICD-10-CM | POA: Insufficient documentation

## 2017-02-06 DIAGNOSIS — J984 Other disorders of lung: Secondary | ICD-10-CM | POA: Insufficient documentation

## 2017-02-06 NOTE — Progress Notes (Signed)
One SST tube of blood drawn from right AC space for LP labs; site unremarkable.  Kacee Koren, RN 

## 2017-02-06 NOTE — Discharge Instructions (Signed)

## 2017-02-06 NOTE — Telephone Encounter (Signed)
I called the patient.  The spinal fluid analysis does show an elevated spinal fluid protein of 61.  Nerve conduction studies checked in the arms show marked slowing, nerve conduction velocities in the 20s consistent with a demyelinating neuropathy.  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  We will initiate IVIG therapy to improve his quadriparesis.  The patient is being followed through advanced home care, I will get the treatment started through home health therapy.

## 2017-02-08 ENCOUNTER — Telehealth: Payer: Self-pay | Admitting: *Deleted

## 2017-02-08 DIAGNOSIS — D509 Iron deficiency anemia, unspecified: Secondary | ICD-10-CM | POA: Diagnosis not present

## 2017-02-08 DIAGNOSIS — E1122 Type 2 diabetes mellitus with diabetic chronic kidney disease: Secondary | ICD-10-CM | POA: Diagnosis not present

## 2017-02-08 DIAGNOSIS — I2510 Atherosclerotic heart disease of native coronary artery without angina pectoris: Secondary | ICD-10-CM | POA: Diagnosis not present

## 2017-02-08 DIAGNOSIS — E1129 Type 2 diabetes mellitus with other diabetic kidney complication: Secondary | ICD-10-CM | POA: Diagnosis not present

## 2017-02-08 DIAGNOSIS — I120 Hypertensive chronic kidney disease with stage 5 chronic kidney disease or end stage renal disease: Secondary | ICD-10-CM | POA: Diagnosis not present

## 2017-02-08 DIAGNOSIS — D631 Anemia in chronic kidney disease: Secondary | ICD-10-CM | POA: Diagnosis not present

## 2017-02-08 DIAGNOSIS — J189 Pneumonia, unspecified organism: Secondary | ICD-10-CM | POA: Diagnosis not present

## 2017-02-08 DIAGNOSIS — N186 End stage renal disease: Secondary | ICD-10-CM | POA: Diagnosis not present

## 2017-02-08 DIAGNOSIS — E1151 Type 2 diabetes mellitus with diabetic peripheral angiopathy without gangrene: Secondary | ICD-10-CM | POA: Diagnosis not present

## 2017-02-08 DIAGNOSIS — N2581 Secondary hyperparathyroidism of renal origin: Secondary | ICD-10-CM | POA: Diagnosis not present

## 2017-02-08 NOTE — Telephone Encounter (Addendum)
Faxed signed order for rx privigen 40gm/day for 5 days, then 40gm q4 weeks, refills 12. Pre-medication: 650mg  acetaminophen po 30 mins prior to infusion, diphenhydramine po 25mg  30 mins prior to infusion. Fax: [phone]. Received fax confirmation.  Submitted PA Privigen on covermymeds. Key: [id number]. Should have a response 1-3 days. PA was marked urgent. "If [employer] has not responded in 1-3 days or if you have any questions about your ePA request, please contact [employer] at [phone]."  Received fax notification from [employer] that PA Privigen approved 11/10/16-02/08/2018.  [facility] at [phone]. Received fax confirmation.

## 2017-02-08 NOTE — Telephone Encounter (Signed)
I called Kathlene NovemberMike back. He states that patient needs re certification for home health therapy in 9 days. He is wondering besides the IVIG infusion, what therapy's Dr. Anne HahnWillis would like patient to receive, if anything. Advised I will speak with Dr. Anne HahnWillis and we will call back to let him know. He verbalized understanding.

## 2017-02-08 NOTE — Telephone Encounter (Addendum)
[facility] Kendal HymenBonnie gave him the message I called.  He verified they are ordering 40gm/day for 5 days (5 vials total for loading dose) then he will go to 40gm q4weeks.   Advised we will complete PA on rx Privigen. May have to repeat PA for maintenance dose since PA for loading dose. They stated since this PA is through his prescription plan, our office has to complete the PA.  Patient ID#: [id number]. RxBIN: [id number], PCN: [id number], Group: [id number]

## 2017-02-08 NOTE — Telephone Encounter (Signed)
[name] call back at [phone] to discuss what we are going to do moving forward regarding the pt

## 2017-02-08 NOTE — Telephone Encounter (Signed)
I called Kathlene NovemberMike.  The patient has not been progressing much recently, he will just start IVIG therapy, may give him a month and then reassess his condition, he may do better with physical therapy after he starts the IVIG therapies.

## 2017-02-08 NOTE — Telephone Encounter (Signed)
Called and LVM [name] to [facility] for Privigen and PA that is needed. Gave [facility] phone number for call back and my name.

## 2017-02-09 DIAGNOSIS — D509 Iron deficiency anemia, unspecified: Secondary | ICD-10-CM | POA: Diagnosis not present

## 2017-02-09 DIAGNOSIS — N2581 Secondary hyperparathyroidism of renal origin: Secondary | ICD-10-CM | POA: Diagnosis not present

## 2017-02-09 DIAGNOSIS — D631 Anemia in chronic kidney disease: Secondary | ICD-10-CM | POA: Diagnosis not present

## 2017-02-09 DIAGNOSIS — N186 End stage renal disease: Secondary | ICD-10-CM | POA: Diagnosis not present

## 2017-02-09 DIAGNOSIS — E1129 Type 2 diabetes mellitus with other diabetic kidney complication: Secondary | ICD-10-CM | POA: Diagnosis not present

## 2017-02-09 DIAGNOSIS — J189 Pneumonia, unspecified organism: Secondary | ICD-10-CM | POA: Diagnosis not present

## 2017-02-12 DIAGNOSIS — D631 Anemia in chronic kidney disease: Secondary | ICD-10-CM | POA: Diagnosis not present

## 2017-02-12 DIAGNOSIS — N186 End stage renal disease: Secondary | ICD-10-CM | POA: Diagnosis not present

## 2017-02-12 DIAGNOSIS — D509 Iron deficiency anemia, unspecified: Secondary | ICD-10-CM | POA: Diagnosis not present

## 2017-02-12 DIAGNOSIS — E1129 Type 2 diabetes mellitus with other diabetic kidney complication: Secondary | ICD-10-CM | POA: Diagnosis not present

## 2017-02-12 DIAGNOSIS — J189 Pneumonia, unspecified organism: Secondary | ICD-10-CM | POA: Diagnosis not present

## 2017-02-12 DIAGNOSIS — N2581 Secondary hyperparathyroidism of renal origin: Secondary | ICD-10-CM | POA: Diagnosis not present

## 2017-02-13 ENCOUNTER — Ambulatory Visit (HOSPITAL_COMMUNITY): Payer: Medicare Other

## 2017-02-13 ENCOUNTER — Telehealth: Payer: Self-pay | Admitting: *Deleted

## 2017-02-13 LAB — CSF CELL COUNT WITH DIFFERENTIAL
RBC COUNT CSF: 0 {cells}/uL (ref 0–10)
WBC CSF: 0 {cells}/uL (ref 0–5)

## 2017-02-13 LAB — OLIGOCLONAL BANDS, CSF + SERM

## 2017-02-13 LAB — PROTEIN, CSF: Total Protein, CSF: 61 mg/dL — ABNORMAL HIGH (ref 15–45)

## 2017-02-13 LAB — GLUCOSE, CSF: GLUCOSE CSF: 174 mg/dL — AB (ref 40–80)

## 2017-02-13 NOTE — Telephone Encounter (Signed)
I called the pharmacist.  I do not believe that the IVIG is dialyzed as the molecule is fairly large.  I have no problem with use of IVIG in a hemodialysis patient.

## 2017-02-13 NOTE — Telephone Encounter (Signed)
Took call from phone staff. Spoke with Weyman CroonStan [facility]acist[facility]. He had some concerns about patient IVIG and him being on dialysis M,W,Fri. They are wanting to know if patient can receive IVIG post dialysis treatment so it is not dialized out. He received IVIG yesterday for the first time after dialysis.  Dr. Anne HahnWillis can call him back at [phone] ext [phone]. He will wait to hear from him to let the nurse know how to proceed with next infusion today.

## 2017-02-14 DIAGNOSIS — D509 Iron deficiency anemia, unspecified: Secondary | ICD-10-CM | POA: Diagnosis not present

## 2017-02-14 DIAGNOSIS — J189 Pneumonia, unspecified organism: Secondary | ICD-10-CM | POA: Diagnosis not present

## 2017-02-14 DIAGNOSIS — D631 Anemia in chronic kidney disease: Secondary | ICD-10-CM | POA: Diagnosis not present

## 2017-02-14 DIAGNOSIS — N2581 Secondary hyperparathyroidism of renal origin: Secondary | ICD-10-CM | POA: Diagnosis not present

## 2017-02-14 DIAGNOSIS — N186 End stage renal disease: Secondary | ICD-10-CM | POA: Diagnosis not present

## 2017-02-14 DIAGNOSIS — E1129 Type 2 diabetes mellitus with other diabetic kidney complication: Secondary | ICD-10-CM | POA: Diagnosis not present

## 2017-02-16 DIAGNOSIS — E1122 Type 2 diabetes mellitus with diabetic chronic kidney disease: Secondary | ICD-10-CM | POA: Diagnosis not present

## 2017-02-16 DIAGNOSIS — E1151 Type 2 diabetes mellitus with diabetic peripheral angiopathy without gangrene: Secondary | ICD-10-CM | POA: Diagnosis not present

## 2017-02-16 DIAGNOSIS — E1129 Type 2 diabetes mellitus with other diabetic kidney complication: Secondary | ICD-10-CM | POA: Diagnosis not present

## 2017-02-16 DIAGNOSIS — D509 Iron deficiency anemia, unspecified: Secondary | ICD-10-CM | POA: Diagnosis not present

## 2017-02-16 DIAGNOSIS — N2581 Secondary hyperparathyroidism of renal origin: Secondary | ICD-10-CM | POA: Diagnosis not present

## 2017-02-16 DIAGNOSIS — I120 Hypertensive chronic kidney disease with stage 5 chronic kidney disease or end stage renal disease: Secondary | ICD-10-CM | POA: Diagnosis not present

## 2017-02-16 DIAGNOSIS — N186 End stage renal disease: Secondary | ICD-10-CM | POA: Diagnosis not present

## 2017-02-16 DIAGNOSIS — I2510 Atherosclerotic heart disease of native coronary artery without angina pectoris: Secondary | ICD-10-CM | POA: Diagnosis not present

## 2017-02-16 DIAGNOSIS — J189 Pneumonia, unspecified organism: Secondary | ICD-10-CM | POA: Diagnosis not present

## 2017-02-16 DIAGNOSIS — D631 Anemia in chronic kidney disease: Secondary | ICD-10-CM | POA: Diagnosis not present

## 2017-02-19 DIAGNOSIS — E1129 Type 2 diabetes mellitus with other diabetic kidney complication: Secondary | ICD-10-CM | POA: Diagnosis not present

## 2017-02-19 DIAGNOSIS — D631 Anemia in chronic kidney disease: Secondary | ICD-10-CM | POA: Diagnosis not present

## 2017-02-19 DIAGNOSIS — J189 Pneumonia, unspecified organism: Secondary | ICD-10-CM | POA: Diagnosis not present

## 2017-02-19 DIAGNOSIS — D509 Iron deficiency anemia, unspecified: Secondary | ICD-10-CM | POA: Diagnosis not present

## 2017-02-19 DIAGNOSIS — N2581 Secondary hyperparathyroidism of renal origin: Secondary | ICD-10-CM | POA: Diagnosis not present

## 2017-02-19 DIAGNOSIS — N186 End stage renal disease: Secondary | ICD-10-CM | POA: Diagnosis not present

## 2017-02-20 ENCOUNTER — Telehealth: Payer: Self-pay | Admitting: *Deleted

## 2017-02-20 DIAGNOSIS — E113513 Type 2 diabetes mellitus with proliferative diabetic retinopathy with macular edema, bilateral: Secondary | ICD-10-CM | POA: Diagnosis not present

## 2017-02-20 DIAGNOSIS — H2513 Age-related nuclear cataract, bilateral: Secondary | ICD-10-CM | POA: Diagnosis not present

## 2017-02-20 DIAGNOSIS — H3582 Retinal ischemia: Secondary | ICD-10-CM | POA: Diagnosis not present

## 2017-02-20 DIAGNOSIS — H4311 Vitreous hemorrhage, right eye: Secondary | ICD-10-CM | POA: Diagnosis not present

## 2017-02-20 NOTE — Telephone Encounter (Signed)
Faxed signed [facility] at [phone] re: Privigen/POC. Received fax confirmation.

## 2017-02-21 DIAGNOSIS — J189 Pneumonia, unspecified organism: Secondary | ICD-10-CM | POA: Diagnosis not present

## 2017-02-21 DIAGNOSIS — N2581 Secondary hyperparathyroidism of renal origin: Secondary | ICD-10-CM | POA: Diagnosis not present

## 2017-02-21 DIAGNOSIS — D631 Anemia in chronic kidney disease: Secondary | ICD-10-CM | POA: Diagnosis not present

## 2017-02-21 DIAGNOSIS — E1129 Type 2 diabetes mellitus with other diabetic kidney complication: Secondary | ICD-10-CM | POA: Diagnosis not present

## 2017-02-21 DIAGNOSIS — N186 End stage renal disease: Secondary | ICD-10-CM | POA: Diagnosis not present

## 2017-02-21 DIAGNOSIS — D509 Iron deficiency anemia, unspecified: Secondary | ICD-10-CM | POA: Diagnosis not present

## 2017-02-22 ENCOUNTER — Ambulatory Visit (HOSPITAL_COMMUNITY)
Admission: RE | Admit: 2017-02-22 | Discharge: 2017-02-22 | Disposition: A | Discharge status: Home / Self Care | Payer: Medicare Other | Source: Ambulatory Visit | Attending: Nephrology | Admitting: Nephrology

## 2017-02-22 DIAGNOSIS — R918 Other nonspecific abnormal finding of lung field: Secondary | ICD-10-CM | POA: Diagnosis not present

## 2017-02-22 DIAGNOSIS — R0602 Shortness of breath: Secondary | ICD-10-CM

## 2017-02-22 DIAGNOSIS — N186 End stage renal disease: Secondary | ICD-10-CM | POA: Insufficient documentation

## 2017-02-22 DIAGNOSIS — R911 Solitary pulmonary nodule: Secondary | ICD-10-CM | POA: Diagnosis not present

## 2017-02-23 DIAGNOSIS — N2581 Secondary hyperparathyroidism of renal origin: Secondary | ICD-10-CM | POA: Diagnosis not present

## 2017-02-23 DIAGNOSIS — N186 End stage renal disease: Secondary | ICD-10-CM | POA: Diagnosis not present

## 2017-02-23 DIAGNOSIS — D631 Anemia in chronic kidney disease: Secondary | ICD-10-CM | POA: Diagnosis not present

## 2017-02-23 DIAGNOSIS — D509 Iron deficiency anemia, unspecified: Secondary | ICD-10-CM | POA: Diagnosis not present

## 2017-02-23 DIAGNOSIS — J189 Pneumonia, unspecified organism: Secondary | ICD-10-CM | POA: Diagnosis not present

## 2017-02-23 DIAGNOSIS — E1129 Type 2 diabetes mellitus with other diabetic kidney complication: Secondary | ICD-10-CM | POA: Diagnosis not present

## 2017-02-26 DIAGNOSIS — N2581 Secondary hyperparathyroidism of renal origin: Secondary | ICD-10-CM | POA: Diagnosis not present

## 2017-02-26 DIAGNOSIS — N186 End stage renal disease: Secondary | ICD-10-CM | POA: Diagnosis not present

## 2017-02-26 DIAGNOSIS — D509 Iron deficiency anemia, unspecified: Secondary | ICD-10-CM | POA: Diagnosis not present

## 2017-02-26 DIAGNOSIS — J189 Pneumonia, unspecified organism: Secondary | ICD-10-CM | POA: Diagnosis not present

## 2017-02-26 DIAGNOSIS — D631 Anemia in chronic kidney disease: Secondary | ICD-10-CM | POA: Diagnosis not present

## 2017-02-26 DIAGNOSIS — E1129 Type 2 diabetes mellitus with other diabetic kidney complication: Secondary | ICD-10-CM | POA: Diagnosis not present

## 2017-02-28 DIAGNOSIS — E1129 Type 2 diabetes mellitus with other diabetic kidney complication: Secondary | ICD-10-CM | POA: Diagnosis not present

## 2017-02-28 DIAGNOSIS — N2581 Secondary hyperparathyroidism of renal origin: Secondary | ICD-10-CM | POA: Diagnosis not present

## 2017-02-28 DIAGNOSIS — D509 Iron deficiency anemia, unspecified: Secondary | ICD-10-CM | POA: Diagnosis not present

## 2017-02-28 DIAGNOSIS — N186 End stage renal disease: Secondary | ICD-10-CM | POA: Diagnosis not present

## 2017-02-28 DIAGNOSIS — J189 Pneumonia, unspecified organism: Secondary | ICD-10-CM | POA: Diagnosis not present

## 2017-02-28 DIAGNOSIS — D631 Anemia in chronic kidney disease: Secondary | ICD-10-CM | POA: Diagnosis not present

## 2017-03-01 ENCOUNTER — Telehealth: Payer: Self-pay | Admitting: *Deleted

## 2017-03-01 ENCOUNTER — Emergency Department (HOSPITAL_COMMUNITY): Payer: Medicare Other

## 2017-03-01 ENCOUNTER — Inpatient Hospital Stay (HOSPITAL_COMMUNITY)
Admission: EM | Admit: 2017-03-01 | Discharge: 2017-03-13 | DRG: 193 | Disposition: A | Discharge status: Home / Self Care | Payer: Medicare Other | Attending: Internal Medicine | Admitting: Internal Medicine

## 2017-03-01 ENCOUNTER — Ambulatory Visit (INDEPENDENT_AMBULATORY_CARE_PROVIDER_SITE_OTHER): Payer: Medicare Other | Admitting: Pulmonary Disease

## 2017-03-01 ENCOUNTER — Encounter (HOSPITAL_COMMUNITY): Payer: Self-pay | Admitting: *Deleted

## 2017-03-01 ENCOUNTER — Other Ambulatory Visit: Payer: Self-pay

## 2017-03-01 ENCOUNTER — Encounter: Payer: Self-pay | Admitting: Pulmonary Disease

## 2017-03-01 ENCOUNTER — Encounter (HOSPITAL_COMMUNITY): Payer: Self-pay

## 2017-03-01 ENCOUNTER — Telehealth: Payer: Self-pay

## 2017-03-01 VITALS — BP 168/102 | HR 102 | Ht 74.0 in | Wt 251.0 lb

## 2017-03-01 DIAGNOSIS — R030 Elevated blood-pressure reading, without diagnosis of hypertension: Secondary | ICD-10-CM | POA: Diagnosis not present

## 2017-03-01 DIAGNOSIS — R05 Cough: Secondary | ICD-10-CM | POA: Diagnosis not present

## 2017-03-01 DIAGNOSIS — G8929 Other chronic pain: Secondary | ICD-10-CM | POA: Diagnosis present

## 2017-03-01 DIAGNOSIS — I10 Essential (primary) hypertension: Secondary | ICD-10-CM | POA: Diagnosis present

## 2017-03-01 DIAGNOSIS — H3530 Unspecified macular degeneration: Secondary | ICD-10-CM | POA: Diagnosis present

## 2017-03-01 DIAGNOSIS — I25110 Atherosclerotic heart disease of native coronary artery with unstable angina pectoris: Secondary | ICD-10-CM | POA: Diagnosis present

## 2017-03-01 DIAGNOSIS — G8250 Quadriplegia, unspecified: Secondary | ICD-10-CM | POA: Diagnosis not present

## 2017-03-01 DIAGNOSIS — Z86718 Personal history of other venous thrombosis and embolism: Secondary | ICD-10-CM

## 2017-03-01 DIAGNOSIS — F329 Major depressive disorder, single episode, unspecified: Secondary | ICD-10-CM | POA: Diagnosis present

## 2017-03-01 DIAGNOSIS — I420 Dilated cardiomyopathy: Secondary | ICD-10-CM | POA: Diagnosis present

## 2017-03-01 DIAGNOSIS — Z91048 Other nonmedicinal substance allergy status: Secondary | ICD-10-CM

## 2017-03-01 DIAGNOSIS — E8889 Other specified metabolic disorders: Secondary | ICD-10-CM | POA: Diagnosis present

## 2017-03-01 DIAGNOSIS — I5043 Acute on chronic combined systolic (congestive) and diastolic (congestive) heart failure: Secondary | ICD-10-CM | POA: Diagnosis not present

## 2017-03-01 DIAGNOSIS — Y95 Nosocomial condition: Secondary | ICD-10-CM | POA: Diagnosis present

## 2017-03-01 DIAGNOSIS — R911 Solitary pulmonary nodule: Secondary | ICD-10-CM | POA: Diagnosis not present

## 2017-03-01 DIAGNOSIS — E1151 Type 2 diabetes mellitus with diabetic peripheral angiopathy without gangrene: Secondary | ICD-10-CM | POA: Diagnosis present

## 2017-03-01 DIAGNOSIS — Z792 Long term (current) use of antibiotics: Secondary | ICD-10-CM

## 2017-03-01 DIAGNOSIS — E785 Hyperlipidemia, unspecified: Secondary | ICD-10-CM | POA: Diagnosis present

## 2017-03-01 DIAGNOSIS — N2581 Secondary hyperparathyroidism of renal origin: Secondary | ICD-10-CM | POA: Diagnosis present

## 2017-03-01 DIAGNOSIS — E1122 Type 2 diabetes mellitus with diabetic chronic kidney disease: Secondary | ICD-10-CM | POA: Diagnosis present

## 2017-03-01 DIAGNOSIS — J189 Pneumonia, unspecified organism: Secondary | ICD-10-CM | POA: Diagnosis not present

## 2017-03-01 DIAGNOSIS — Z87441 Personal history of nephrotic syndrome: Secondary | ICD-10-CM

## 2017-03-01 DIAGNOSIS — K3184 Gastroparesis: Secondary | ICD-10-CM | POA: Diagnosis not present

## 2017-03-01 DIAGNOSIS — G6181 Chronic inflammatory demyelinating polyneuritis: Secondary | ICD-10-CM | POA: Diagnosis not present

## 2017-03-01 DIAGNOSIS — R0602 Shortness of breath: Secondary | ICD-10-CM

## 2017-03-01 DIAGNOSIS — N186 End stage renal disease: Secondary | ICD-10-CM | POA: Diagnosis not present

## 2017-03-01 DIAGNOSIS — R079 Chest pain, unspecified: Secondary | ICD-10-CM | POA: Diagnosis not present

## 2017-03-01 DIAGNOSIS — Z992 Dependence on renal dialysis: Secondary | ICD-10-CM

## 2017-03-01 DIAGNOSIS — Q631 Lobulated, fused and horseshoe kidney: Secondary | ICD-10-CM

## 2017-03-01 DIAGNOSIS — Z951 Presence of aortocoronary bypass graft: Secondary | ICD-10-CM

## 2017-03-01 DIAGNOSIS — E039 Hypothyroidism, unspecified: Secondary | ICD-10-CM

## 2017-03-01 DIAGNOSIS — D631 Anemia in chronic kidney disease: Secondary | ICD-10-CM | POA: Diagnosis present

## 2017-03-01 DIAGNOSIS — I132 Hypertensive heart and chronic kidney disease with heart failure and with stage 5 chronic kidney disease, or end stage renal disease: Secondary | ICD-10-CM | POA: Diagnosis not present

## 2017-03-01 DIAGNOSIS — Z886 Allergy status to analgesic agent status: Secondary | ICD-10-CM

## 2017-03-01 DIAGNOSIS — F419 Anxiety disorder, unspecified: Secondary | ICD-10-CM | POA: Diagnosis present

## 2017-03-01 DIAGNOSIS — E1121 Type 2 diabetes mellitus with diabetic nephropathy: Secondary | ICD-10-CM | POA: Diagnosis present

## 2017-03-01 DIAGNOSIS — Z794 Long term (current) use of insulin: Secondary | ICD-10-CM

## 2017-03-01 DIAGNOSIS — I272 Pulmonary hypertension, unspecified: Secondary | ICD-10-CM | POA: Diagnosis present

## 2017-03-01 DIAGNOSIS — R112 Nausea with vomiting, unspecified: Secondary | ICD-10-CM | POA: Diagnosis not present

## 2017-03-01 DIAGNOSIS — Z7982 Long term (current) use of aspirin: Secondary | ICD-10-CM

## 2017-03-01 DIAGNOSIS — R1310 Dysphagia, unspecified: Secondary | ICD-10-CM

## 2017-03-01 DIAGNOSIS — IMO0002 Reserved for concepts with insufficient information to code with codable children: Secondary | ICD-10-CM | POA: Diagnosis present

## 2017-03-01 DIAGNOSIS — E1143 Type 2 diabetes mellitus with diabetic autonomic (poly)neuropathy: Secondary | ICD-10-CM | POA: Diagnosis not present

## 2017-03-01 DIAGNOSIS — E1165 Type 2 diabetes mellitus with hyperglycemia: Secondary | ICD-10-CM | POA: Diagnosis present

## 2017-03-01 DIAGNOSIS — J154 Pneumonia due to other streptococci: Principal | ICD-10-CM | POA: Diagnosis present

## 2017-03-01 DIAGNOSIS — J440 Chronic obstructive pulmonary disease with acute lower respiratory infection: Secondary | ICD-10-CM | POA: Diagnosis present

## 2017-03-01 DIAGNOSIS — Z6831 Body mass index (BMI) 31.0-31.9, adult: Secondary | ICD-10-CM

## 2017-03-01 DIAGNOSIS — Z79899 Other long term (current) drug therapy: Secondary | ICD-10-CM

## 2017-03-01 HISTORY — DX: Gastroparesis: K31.84

## 2017-03-01 HISTORY — DX: Polyneuropathy, unspecified: G62.9

## 2017-03-01 LAB — CBC
HCT: 38.5 % — ABNORMAL LOW (ref 39.0–52.0)
HCT: 38.1 % — ABNORMAL LOW (ref 39.0–52.0)
HEMOGLOBIN: 12.7 g/dL — AB (ref 13.0–17.0)
Hemoglobin: 12.7 g/dL — ABNORMAL LOW (ref 13.0–17.0)
MCH: 29.2 pg (ref 26.0–34.0)
MCH: 29.5 pg (ref 26.0–34.0)
MCHC: 33 g/dL (ref 30.0–36.0)
MCHC: 33.3 g/dL (ref 30.0–36.0)
MCV: 88.5 fL (ref 78.0–100.0)
MCV: 88.4 fL (ref 78.0–100.0)
PLATELETS: 273 10*3/uL (ref 150–400)
Platelets: 282 10*3/uL (ref 150–400)
RBC: 4.35 MIL/uL (ref 4.22–5.81)
RBC: 4.31 MIL/uL (ref 4.22–5.81)
RDW: 14.9 % (ref 11.5–15.5)
RDW: 15 % (ref 11.5–15.5)
WBC: 8.2 10*3/uL (ref 4.0–10.5)
WBC: 10 10*3/uL (ref 4.0–10.5)

## 2017-03-01 LAB — I-STAT VENOUS BLOOD GAS, ED
ACID-BASE EXCESS: 7 mmol/L — AB (ref 0.0–2.0)
BICARBONATE: 32.9 mmol/L — AB (ref 20.0–28.0)
O2 Saturation: 44 %
TCO2: 34 mmol/L — AB (ref 22–32)
pCO2, Ven: 48.4 mmHg (ref 44.0–60.0)
pH, Ven: 7.44 — ABNORMAL HIGH (ref 7.250–7.430)
pO2, Ven: 24 mmHg — CL (ref 32.0–45.0)

## 2017-03-01 LAB — COMPREHENSIVE METABOLIC PANEL
ALT: 14 U/L — AB (ref 17–63)
AST: 21 U/L (ref 15–41)
Albumin: 3.7 g/dL (ref 3.5–5.0)
Alkaline Phosphatase: 82 U/L (ref 38–126)
Anion gap: 18 — ABNORMAL HIGH (ref 5–15)
BUN: 29 mg/dL — AB (ref 6–20)
CHLORIDE: 88 mmol/L — AB (ref 101–111)
CO2: 25 mmol/L (ref 22–32)
CREATININE: 6.9 mg/dL — AB (ref 0.61–1.24)
Calcium: 9.3 mg/dL (ref 8.9–10.3)
GFR calc Af Amer: 10 mL/min — ABNORMAL LOW (ref >60)
GFR, EST NON AFRICAN AMERICAN: 9 mL/min — AB (ref >60)
Glucose, Bld: 337 mg/dL — ABNORMAL HIGH (ref 65–99)
Potassium: 4.2 mmol/L (ref 3.5–5.1)
Sodium: 131 mmol/L — ABNORMAL LOW (ref 135–145)
Total Bilirubin: 0.8 mg/dL (ref 0.3–1.2)
Total Protein: 7.9 g/dL (ref 6.5–8.1)

## 2017-03-01 LAB — GLUCOSE, CAPILLARY: GLUCOSE-CAPILLARY: 348 mg/dL — AB (ref 65–99)

## 2017-03-01 LAB — I-STAT TROPONIN, ED
Troponin i, poc: 0.04 ng/mL (ref 0.00–0.08)
Troponin i, poc: 0.04 ng/mL (ref 0.00–0.08)

## 2017-03-01 LAB — BETA-HYDROXYBUTYRIC ACID: Beta-Hydroxybutyric Acid: 1.08 mmol/L — ABNORMAL HIGH (ref 0.05–0.27)

## 2017-03-01 LAB — INFLUENZA PANEL BY PCR (TYPE A & B)
Influenza A By PCR: NEGATIVE
Influenza B By PCR: NEGATIVE

## 2017-03-01 LAB — CREATININE, SERUM
CREATININE: 7.15 mg/dL — AB (ref 0.61–1.24)
GFR calc Af Amer: 10 mL/min — ABNORMAL LOW (ref >60)
GFR calc non Af Amer: 8 mL/min — ABNORMAL LOW (ref >60)

## 2017-03-01 LAB — MAGNESIUM
MAGNESIUM: 2 mg/dL (ref 1.7–2.4)
Magnesium: 2.1 mg/dL (ref 1.7–2.4)

## 2017-03-01 LAB — NITRIC OXIDE: Nitric Oxide: 11

## 2017-03-01 LAB — CBG MONITORING, ED: Glucose-Capillary: 270 mg/dL — ABNORMAL HIGH (ref 65–99)

## 2017-03-01 LAB — I-STAT CG4 LACTIC ACID, ED: LACTIC ACID, VENOUS: 1.75 mmol/L (ref 0.5–1.9)

## 2017-03-01 LAB — PHOSPHORUS
PHOSPHORUS: 6.2 mg/dL — AB (ref 2.5–4.6)
Phosphorus: 6.8 mg/dL — ABNORMAL HIGH (ref 2.5–4.6)

## 2017-03-01 LAB — LIPASE, BLOOD: LIPASE: 45 U/L (ref 11–51)

## 2017-03-01 MED ORDER — LEVOTHYROXINE SODIUM 50 MCG PO TABS
50.0000 ug | ORAL_TABLET | Freq: Every day | ORAL | Status: DC
  Administered 2017-03-02 – 2017-03-11 (×7): 50 ug via ORAL
  Filled 2017-03-01 (×9): qty 1

## 2017-03-01 MED ORDER — ASPIRIN EC 81 MG PO TBEC
81.0000 mg | DELAYED_RELEASE_TABLET | Freq: Every day | ORAL | Status: DC
  Administered 2017-03-01 – 2017-03-13 (×12): 81 mg via ORAL
  Filled 2017-03-01 (×13): qty 1

## 2017-03-01 MED ORDER — VANCOMYCIN HCL 10 G IV SOLR
2500.0000 mg | Freq: Once | INTRAVENOUS | Status: AC
  Administered 2017-03-01: 2500 mg via INTRAVENOUS
  Filled 2017-03-01: qty 2500

## 2017-03-01 MED ORDER — VANCOMYCIN HCL IN DEXTROSE 1-5 GM/200ML-% IV SOLN: 1000.0000 mg | Freq: Once | INTRAVENOUS | Status: DC

## 2017-03-01 MED ORDER — VANCOMYCIN HCL IN DEXTROSE 1-5 GM/200ML-% IV SOLN
1000.0000 mg | INTRAVENOUS | Status: DC
  Administered 2017-03-02: 1000 mg via INTRAVENOUS
  Filled 2017-03-01: qty 200

## 2017-03-01 MED ORDER — HYDROMORPHONE HCL 2 MG PO TABS
2.0000 mg | ORAL_TABLET | Freq: Three times a day (TID) | ORAL | Status: DC
  Administered 2017-03-01: 2 mg via ORAL
  Filled 2017-03-01 (×2): qty 1

## 2017-03-01 MED ORDER — CALCITRIOL 0.25 MCG PO CAPS: 0.2500 ug | ORAL_CAPSULE | ORAL | Status: DC

## 2017-03-01 MED ORDER — ATORVASTATIN CALCIUM 80 MG PO TABS
80.0000 mg | ORAL_TABLET | Freq: Every day | ORAL | Status: DC
  Administered 2017-03-02 – 2017-03-12 (×11): 80 mg via ORAL
  Filled 2017-03-01 (×11): qty 1

## 2017-03-01 MED ORDER — METOPROLOL TARTRATE 25 MG PO TABS
100.0000 mg | ORAL_TABLET | Freq: Two times a day (BID) | ORAL | Status: DC
  Administered 2017-03-01 – 2017-03-04 (×6): 100 mg via ORAL
  Filled 2017-03-01 (×6): qty 4

## 2017-03-01 MED ORDER — HYDRALAZINE HCL 20 MG/ML IJ SOLN: 10.0000 mg | Freq: Once | INTRAMUSCULAR | Status: DC

## 2017-03-01 MED ORDER — LEVOFLOXACIN 500 MG PO TABS: ORAL_TABLET | ORAL | 0 refills | Status: DC

## 2017-03-01 MED ORDER — SODIUM CHLORIDE 0.9 % IV SOLN
2.0000 g | Freq: Once | INTRAVENOUS | Status: AC
  Administered 2017-03-01: 2 g via INTRAVENOUS
  Filled 2017-03-01: qty 2

## 2017-03-01 MED ORDER — IPRATROPIUM-ALBUTEROL 0.5-2.5 (3) MG/3ML IN SOLN
3.0000 mL | Freq: Four times a day (QID) | RESPIRATORY_TRACT | Status: DC
  Administered 2017-03-01: 3 mL via RESPIRATORY_TRACT
  Filled 2017-03-01: qty 3

## 2017-03-01 MED ORDER — IOPAMIDOL (ISOVUE-370) INJECTION 76%
INTRAVENOUS | Status: AC
  Administered 2017-03-01: 100 mL
  Filled 2017-03-01: qty 100

## 2017-03-01 MED ORDER — ONDANSETRON HCL 4 MG/2ML IJ SOLN
4.0000 mg | Freq: Once | INTRAMUSCULAR | Status: AC
  Administered 2017-03-01: 4 mg via INTRAVENOUS
  Filled 2017-03-01: qty 2

## 2017-03-01 MED ORDER — GABAPENTIN 100 MG PO CAPS
200.0000 mg | ORAL_CAPSULE | Freq: Three times a day (TID) | ORAL | Status: DC
  Administered 2017-03-01 – 2017-03-13 (×32): 200 mg via ORAL
  Filled 2017-03-01 (×32): qty 2

## 2017-03-01 MED ORDER — ONDANSETRON HCL 4 MG PO TABS: 4.0000 mg | ORAL_TABLET | Freq: Three times a day (TID) | ORAL | 1 refills | Status: DC | PRN

## 2017-03-01 MED ORDER — METOPROLOL TARTRATE 25 MG PO TABS: 100.0000 mg | ORAL_TABLET | Freq: Once | ORAL | Status: DC

## 2017-03-01 MED ORDER — SODIUM CHLORIDE 0.9 % IV SOLN
1.0000 g | INTRAVENOUS | Status: DC
  Administered 2017-03-02 – 2017-03-07 (×6): 1 g via INTRAVENOUS
  Filled 2017-03-01 (×8): qty 1

## 2017-03-01 MED ORDER — CITALOPRAM HYDROBROMIDE 20 MG PO TABS
40.0000 mg | ORAL_TABLET | Freq: Every day | ORAL | Status: DC
  Administered 2017-03-01 – 2017-03-12 (×12): 40 mg via ORAL
  Filled 2017-03-01: qty 2
  Filled 2017-03-01: qty 1
  Filled 2017-03-01: qty 4
  Filled 2017-03-01 (×2): qty 2
  Filled 2017-03-01 (×2): qty 4
  Filled 2017-03-01 (×3): qty 2
  Filled 2017-03-01: qty 4
  Filled 2017-03-01: qty 2

## 2017-03-01 MED ORDER — METOCLOPRAMIDE HCL 10 MG PO TABS
10.0000 mg | ORAL_TABLET | Freq: Three times a day (TID) | ORAL | Status: DC
  Administered 2017-03-01 – 2017-03-02 (×2): 10 mg via ORAL
  Filled 2017-03-01 (×2): qty 1

## 2017-03-01 MED ORDER — SODIUM CHLORIDE 0.9 % IV BOLUS (SEPSIS)
500.0000 mL | Freq: Once | INTRAVENOUS | Status: AC
  Administered 2017-03-01: 500 mL via INTRAVENOUS

## 2017-03-01 MED ORDER — IPRATROPIUM-ALBUTEROL 0.5-2.5 (3) MG/3ML IN SOLN
3.0000 mL | Freq: Two times a day (BID) | RESPIRATORY_TRACT | Status: DC
  Administered 2017-03-02 – 2017-03-03 (×3): 3 mL via RESPIRATORY_TRACT
  Filled 2017-03-01 (×3): qty 3

## 2017-03-01 MED ORDER — HYDRALAZINE HCL 25 MG PO TABS
25.0000 mg | ORAL_TABLET | Freq: Once | ORAL | Status: AC
  Administered 2017-03-01: 25 mg via ORAL
  Filled 2017-03-01: qty 1

## 2017-03-01 MED ORDER — HEPARIN SODIUM (PORCINE) 5000 UNIT/ML IJ SOLN
5000.0000 [IU] | Freq: Three times a day (TID) | INTRAMUSCULAR | Status: DC
  Administered 2017-03-02: 5000 [IU] via SUBCUTANEOUS
  Filled 2017-03-01 (×16): qty 1

## 2017-03-01 MED ORDER — CALCIUM ACETATE (PHOS BINDER) 667 MG PO CAPS: 1334.0000 mg | ORAL_CAPSULE | ORAL | Status: DC

## 2017-03-01 MED ORDER — CINACALCET HCL 30 MG PO TABS
30.0000 mg | ORAL_TABLET | ORAL | Status: DC
  Administered 2017-03-05 – 2017-03-12 (×3): 30 mg via ORAL
  Filled 2017-03-01 (×5): qty 1

## 2017-03-01 MED ORDER — ONDANSETRON HCL 4 MG/2ML IJ SOLN
4.0000 mg | Freq: Once | INTRAMUSCULAR | Status: AC
Start: 2017-03-01 — End: 2017-03-01
  Administered 2017-03-01: 4 mg via INTRAVENOUS
  Filled 2017-03-01: qty 2

## 2017-03-01 MED ORDER — CLONAZEPAM 0.5 MG PO TABS: 2.0000 mg | ORAL_TABLET | Freq: Two times a day (BID) | ORAL | Status: DC | PRN

## 2017-03-01 MED ORDER — INSULIN GLARGINE 100 UNIT/ML ~~LOC~~ SOLN
20.0000 [IU] | Freq: Every day | SUBCUTANEOUS | Status: DC
  Administered 2017-03-01 – 2017-03-12 (×12): 20 [IU] via SUBCUTANEOUS
  Filled 2017-03-01 (×13): qty 0.2

## 2017-03-01 MED ORDER — ALBUTEROL SULFATE (2.5 MG/3ML) 0.083% IN NEBU: 2.5000 mg | INHALATION_SOLUTION | RESPIRATORY_TRACT | Status: DC | PRN

## 2017-03-01 MED ORDER — HYDROXYZINE HCL 25 MG PO TABS
25.0000 mg | ORAL_TABLET | Freq: Two times a day (BID) | ORAL | Status: DC | PRN
  Filled 2017-03-01: qty 1

## 2017-03-01 MED ORDER — HYDRALAZINE HCL 20 MG/ML IJ SOLN
10.0000 mg | Freq: Once | INTRAMUSCULAR | Status: AC
  Administered 2017-03-01: 10 mg via INTRAVENOUS
  Filled 2017-03-01: qty 1

## 2017-03-01 NOTE — ED Notes (Addendum)
Pt given washcloths per request to clean self up in rr. Pt ambulated to RR with stand by assist. Pt independent in RR.

## 2017-03-01 NOTE — ED Notes (Signed)
EDP made aware of patient's high blood pressure.

## 2017-03-01 NOTE — Telephone Encounter (Signed)
Tresa EndoKelly states that [employer] is ready for pickup.  I have made Dr. Isaiah SergeMannam aware, as he will be picking disc up. Nothing further is needed.

## 2017-03-01 NOTE — Telephone Encounter (Signed)
Called and spoke to [place] with [facility] regarding super D. Tresa EndoKelly states she will do some research, and will call us back to let us know if super D can be made.

## 2017-03-01 NOTE — ED Notes (Signed)
md student at bedside

## 2017-03-01 NOTE — Telephone Encounter (Signed)
[id number]. Fax: [phone]. Received fax confirmation.

## 2017-03-01 NOTE — H&P (Signed)
[facility] History and Physical  JORRYN CASAGRANDE ZOX:[id number] DOB: 06-27-1970 DOA: 03/01/2017  Referring physician:  PCP: [name], MD   Chief Complaint: "I was short of breath."  HPI: KENDRIK MCSHAN is a 47 y.o. male  With pmhx significant for anxiety, asthma, CAD, DVT, depression, gastroparesis, DM II who presents with sob. Pt with sob and ill feeling for greater than 2 weeks. No fever or cough. Had been seen by his MD. [name] for f/u today. Told to start levaquin. Got worse and went to ED for eval.   ED Course: Started on Vanc and cefepime. Blood cultures missed. CTA showed pna. Hospitalists consulted for admit.   Review of Systems:  As per HPI otherwise 10 point review of systems negative.    Past Medical History:  Diagnosis Date  . Anxiety   . Asthma   . 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 Daily headache   . Depression   . DVT (deep venous thrombosis), H/o 01/2014-on Xarelto 03/24/2014   LLE  . ESRD (end stage renal disease) on dialysis [facility])    "[place], MWF" (06/29/2016)  . Gait abnormality 12/22/2016  . Gastroparesis 12/22/2016  . Hypertension   . Hypothyroidism   . Nephrotic syndrome 05/18/2014  . Neuropathy   . Type 2 diabetes mellitus with diabetic nephropathy [facility])    Past Surgical History:  Procedure Laterality Date  . ANKLE FRACTURE SURGERY Right 1988  . AV FISTULA PLACEMENT Left 01/01/2015   Procedure: CREATION OF LEFT RADIAL CEPHALIC ARTERIOVENOUS (AV) FISTULA ;  Surgeon: Pryor Ochoa, MD;  Location: [facility] OR;  Service: Vascular;  Laterality: Left;  . CAPD REMOVAL N/A 05/19/2016   Procedure: PD CATH REMOVAL;  Surgeon: Abigail Miyamoto, MD;  Location: [facility] OR;  Service: General;  Laterality: N/A;  . CARDIAC CATHETERIZATION N/A  09/22/2015   Procedure: Left Heart Cath and Coronary Angiography;  Surgeon: Iran Ouch, MD;  Location: [facility] INVASIVE CV LAB;  Service: Cardiovascular;  Laterality: N/A;  . CORONARY ARTERY BYPASS GRAFT N/A 09/28/2015   Procedure: CORONARY ARTERY BYPASS GRAFTING (CABG) x 3 UTILIZING LEFT MAMMARY ARTERY AND ENDOSCOPICALLY HARVESTED LEFT GREATER SAPHENOUS VEIN.;  Surgeon: Delight Ovens, MD;  Location: [facility] OR;  Service: Open Heart Surgery;  Laterality: N/A;  . ENDOVEIN HARVEST OF GREATER SAPHENOUS VEIN Left 09/28/2015   Procedure: ENDOVEIN HARVEST OF GREATER SAPHENOUS VEIN;  Surgeon: Delight Ovens, MD;  Location: [facility] OR;  Service: Open Heart Surgery;  Laterality: Left;  . ESOPHAGOGASTRODUODENOSCOPY N/A 03/29/2014   Procedure: ESOPHAGOGASTRODUODENOSCOPY (EGD);  Surgeon: Dorena Cookey, MD;  Location: [facility] ENDOSCOPY;  Service: Endoscopy;  Laterality: N/A;  . EYE SURGERY    . FRACTURE SURGERY    . INSERTION OF DIALYSIS CATHETER Right 01/05/2015   Procedure: INSERTION OF RIGHT INTERNAL JUGULAR DIALYSIS CATHETER;  Surgeon: Fransisco Hertz, MD;  Location: [facility] OR;  Service: Vascular;  Laterality: Right;  . LEFT HEART CATH AND CORS/GRAFTS ANGIOGRAPHY N/A 09/13/2016   Procedure: LEFT HEART CATH AND CORS/GRAFTS ANGIOGRAPHY;  Surgeon: Kathleene Hazel, MD;  Location: [facility] INVASIVE CV LAB;  Service: Cardiovascular;  Laterality: N/A;  . RETINAL LASER PROCEDURE Bilateral   . TEE WITHOUT CARDIOVERSION N/A 09/28/2015   Procedure: TRANSESOPHAGEAL ECHOCARDIOGRAM (TEE);  Surgeon: Delight Ovens, MD;  Location: [facility] OR;  Service:  Open Heart Surgery;  Laterality: N/A;  . TONSILLECTOMY AND ADENOIDECTOMY  1970s   Social History:  reports that  has never smoked. he has never used smokeless tobacco. He reports that he drinks alcohol. He reports that he does not use drugs.  Allergies  Allergen Reactions  . Nsaids Other (See Comments)    Told to avoid all nsaids due to kidney disease   . Tape Other (See Comments)    Welts  result, if left for a long amount of time    Family History  Problem Relation Age of Onset  . Obesity Mother        Patient states that family members have no other medical illnesses other than what I have described  . Kidney cancer Maternal Grandmother   . Cancer Father 50       AML  . Heart disease Unknown        No family history     Prior to Admission medications   Medication Sig Start Date End Date Taking? Authorizing Provider  acetaminophen (TYLENOL) 500 MG tablet Take 1 tablet (500 mg total) by mouth every 6 (six) hours as needed for moderate pain. Patient taking differently: Take 1,000 mg by mouth every 6 (six) hours as needed (for pain).  09/29/14  Yes Albertine GratesXu, Fang, MD  albuterol (PROVENTIL HFA;VENTOLIN HFA) 108 (90 Base) MCG/ACT inhaler Inhale 1 puff into the lungs every 6 (six) hours as needed for wheezing or shortness of breath. Patient taking differently: Inhale 2 puffs into the lungs every 8 (eight) hours as needed for wheezing or shortness of breath.  03/11/15  Yes Elvina SidleLauenstein, Kurt, MD  albuterol (PROVENTIL) (2.5 MG/3ML) 0.083% nebulizer solution Take 3 mLs (2.5 mg total) by nebulization every 4 (four) hours as needed for wheezing. 09/20/16  Yes Sherren MochaShaw, Eva N, MD  aspirin EC 81 MG EC tablet Take 1 tablet (81 mg total) by mouth daily. 10/06/15  Yes Barrett, Erin R, PA-C  atorvastatin (LIPITOR) 80 MG tablet Take 1 tablet (80 mg total) by mouth daily at 6 PM. 01/05/17  Yes Azalee CourseMeng, Hao, PA  calcitRIOL (ROCALTROL) 0.25 MCG capsule Take 1 capsule (0.25 mcg total) by mouth every Monday, Wednesday, and Friday with hemodialysis. Patient taking differently: Take 0.25 mcg by mouth See admin instructions. Tuesday Thursday Saturday 10/06/15  Yes Barrett, Erin R, PA-C  calcium acetate (PHOSLO) 667 MG capsule Take 2 capsules (1,334 mg total) by mouth 3 (three) times daily with meals. Patient taking differently: Take 1,334-2,001 mg by mouth See admin instructions. Take 2001 mg by mouth 3 times daily with  meals and take 1334 mg by mouth with snacks 01/08/15  Yes Rolly SalterPatel, Pranav M, MD  citalopram (CELEXA) 40 MG tablet Take 40 mg by mouth at bedtime.    Yes [provider]  clonazePAM (KLONOPIN) 2 MG tablet Take 2 mg by mouth as needed.    Yes [provider]  cyclobenzaprine (FLEXERIL) 10 MG tablet Take 1 tablet (10 mg total) 3 times/day as needed-between meals & bedtime by mouth for muscle spasms. 11/07/16  Yes Sherren MochaShaw, Eva N, MD  gabapentin (NEURONTIN) 100 MG capsule Take 2 capsules (200 mg total) at bedtime by mouth. Patient taking differently: Take 200 mg by mouth 3 (three) times daily.  11/07/16  Yes Sherren MochaShaw, Eva N, MD  hydrALAZINE (APRESOLINE) 25 MG tablet Take 1 tablet (25 mg total) by mouth 3 (three) times daily as needed (take for blood pressure above 160 if not controlled with home meds). 10/15/16 10/15/17  Yes Rolly Salter, MD  HYDROcodone-acetaminophen (NORCO/VICODIN) 5-325 MG tablet Take 1-2 tablets by mouth every 4 (four) hours as needed for moderate pain. 08/17/16  Yes [name], MD  HYDROmorphone (DILAUDID) 2 MG tablet Take 2 mg by mouth 3 (three) times daily.   Yes [provider]  hydrOXYzine (ATARAX/VISTARIL) 25 MG tablet Take 25 mg by mouth 2 (two) times daily as needed for anxiety.    Yes [provider]  Immune Globulin, Human, (PRIVIGEN) 40 GM/400ML SOLN Inject 40 g into the vein. 40gm/dayx 5 days and then 40gm q4 weeks   Yes [provider]  insulin glargine (LANTUS) 100 UNIT/ML injection Inject 0.3 mLs (30 Units total) into the skin at bedtime. [place] 01/05/17  Yes Carlus Pavlov, MD  insulin lispro (HUMALOG) 100 UNIT/ML injection Inject 0.15-0.3 mLs (15-30 Units total) into the skin every evening. Take one unit per 5 carbs depending on dinner. 11/29/15  Yes Carlus Pavlov, MD  levothyroxine (SYNTHROID, LEVOTHROID) 50 MCG tablet Take 1 tablet (50 mcg total) by mouth daily before breakfast. 01/05/17  Yes Carlus Pavlov, MD  liraglutide  (VICTOZA) 18 MG/3ML SOPN Inject 0.3 mLs (1.8 mg total) into the skin daily before breakfast. 01/05/17  Yes Carlus Pavlov, MD  metoCLOPramide (REGLAN) 5 MG tablet Take 1 tablet (5 mg total) by mouth every 8 (eight) hours as needed for nausea or vomiting. Patient taking differently: Take 10 mg by mouth 3 (three) times daily.  01/10/16  Yes Dong, Robin, DO  metoprolol tartrate (LOPRESSOR) 100 MG tablet Take 1 tab twice a day on dialysis days and once a day on other days. 01/05/17  Yes Azalee Course, PA  ondansetron (ZOFRAN) 4 MG tablet Take 1 tablet (4 mg total) by mouth 3 (three) times daily as needed for nausea or vomiting. 03/01/17  Yes Mannam, Praveen, MD  cinacalcet (SENSIPAR) 30 MG tablet Take 30 mg by mouth every Monday, Wednesday, and Friday.    [provider]  levofloxacin (LEVAQUIN) 500 MG tablet Take 1 daily every other day after dialysis 03/01/17   Chilton Greathouse, MD   Physical Exam: Vitals:   03/01/17 1715 03/01/17 1800 03/01/17 1830 03/01/17 1900  BP: (!) 151/95 (!) 167/85 (!) 165/93 (!) 181/101  Pulse: (!) 116 (!) 117 (!) 117 (!) 122  Resp:      Temp:      TempSrc:      SpO2: 100% 98% 92% 98%  Weight:      Height:        Wt Readings from Last 3 Encounters:  03/01/17 113.4 kg (250 lb)  03/01/17 113.9 kg (251 lb)  01/05/17 122 kg (269 lb)    General:  Appears calm and comfortable; A&Ox3, ill appearing Eyes:  PERRL, EOMI, normal lids, iris ENT:  grossly normal hearing, lips & tongue Neck:  no LAD, masses or thyromegaly Cardiovascular:  RRR, no m/r/g. No LE edema.  Respiratory:  CTA bilaterally, no w/r/r. Normal respiratory effort. Abdomen:  soft, ntnd Skin:  no rash or induration seen on limited exam Musculoskeletal:  grossly normal tone BUE/BLE Psychiatric:  grossly normal mood and affect, speech fluent and appropriate Neurologic:  CN 2-12 grossly intact, moves all extremities in coordinated fashion.          Labs on Admission:  Basic Metabolic Panel: Recent  Labs  Lab 03/01/17 1312 03/01/17 1502  NA 131*  --   K 4.2  --   CL 88*  --   CO2 25  --  GLUCOSE 337*  --   BUN 29*  --   CREATININE 6.90*  --   CALCIUM 9.3  --   MG  --  2.1  PHOS  --  6.8*   Liver Function Tests: Recent Labs  Lab 03/01/17 1312  AST 21  ALT 14*  ALKPHOS 82  BILITOT 0.8  PROT 7.9  ALBUMIN 3.7   Recent Labs  Lab 03/01/17 1312  LIPASE 45   No results for input(s): AMMONIA in the last 168 hours. CBC: Recent Labs  Lab 03/01/17 1312  WBC 8.2  HGB 12.7*  HCT 38.5*  MCV 88.5  PLT 273   Cardiac Enzymes: No results for input(s): CKTOTAL, CKMB, CKMBINDEX, TROPONINI in the last 168 hours.  BNP (last 3 results) Recent Labs    05/16/16 1740 05/23/16 1558  BNP 662.2* 299.0*    ProBNP (last 3 results) No results for input(s): PROBNP in the last 8760 hours.   Serum creatinine: 6.9 mg/dL (H) 16/10/96 0454 Estimated creatinine clearance: 17.9 mL/min (A)  CBG: No results for input(s): GLUCAP in the last 168 hours.  Radiological Exams on Admission: Dg Chest 2 View  Result Date: 03/01/2017 CLINICAL DATA:  Cough EXAM: CHEST  2 VIEW COMPARISON:  10/13/2016 chest radiograph. FINDINGS: Intact sternotomy wires. Stable cardiomediastinal silhouette with borderline mild cardiomegaly. No pneumothorax. No pleural effusion. Slight prominence of the interstitial markings. No acute consolidative airspace disease. IMPRESSION: Borderline mild cardiomegaly and slight prominence of the interstitial markings. Mild congestive heart failure is not excluded. Electronically Signed   By: Delbert Phenix M.D.   On: 03/01/2017 16:02   Ct Angio Chest Pe W And/or Wo Contrast  Result Date: 03/01/2017 CLINICAL DATA:  Nausea and hypertension this morning. High pretest probability for pulmonary embolism. History of nephrotic syndrome, on dialysis. EXAM: CT ANGIOGRAPHY CHEST WITH CONTRAST TECHNIQUE: Multidetector CT imaging of the chest was performed using the standard protocol  during bolus administration of intravenous contrast. Multiplanar CT image reconstructions and MIPs were obtained to evaluate the vascular anatomy. CONTRAST:  ISOVUE-370 IOPAMIDOL (ISOVUE-370) INJECTION 76% COMPARISON:  02/22/2017 FINDINGS: Cardiovascular: Satisfactory opacification of the pulmonary arteries to the segmental level. No evidence of pulmonary embolism. Cardiomegaly with hypertrophic appearance of the left ventricle. Atherosclerotic calcification, status post CABG. Detection of graft patency is limited due to early aortic opacification. Mediastinum/Nodes: Borderline right paratracheal lymph node size that is stable at up to 12 mm in short axis. Lungs/Pleura: Focal airspace opacity in the right middle lobe persists but appears slightly decreased in bulk and there is slightly decreased regional airspace opacity. Pneumonia is favored over a mass. Upper Abdomen: Negative Musculoskeletal: No acute or aggressive finding. Review of the MIP images confirms the above findings. IMPRESSION: 1. Negative for pulmonary embolism. 2. Focal opacity in the right middle lobe as described on chest CT 7 days prior. There has been subtle decrease in volume favoring improving pneumonia over a neoplasm. This finding is occult radiographically; recommend chest CT follow-up in 6-12 weeks. 3. Cardiomegaly and atherosclerosis. Electronically Signed   By: Marnee Spring M.D.   On: 03/01/2017 15:58    EKG: Independently reviewed. Sinus tachycardia. No STEMI.  Assessment/Plan Active Problems:   CAP (community acquired pneumonia)  CAP Pna seen on CTA Scheduled DuoNeb's When necessary albuterol Antibiotic- Vanc &Cefepeime  Oxygen therapy Continuous pulse oximetry  Hypertension When necessary hydralazine 10 mg IV as needed for severe blood pressure Cont lopressor  DM II Lantus 30u-->20u due to nausea, decr po intake SSI AC  ESRD HD Call nephrology in the AM Cont Sensipar & Calcitrol &  Phoslo  Hyperlipidemia Continue statin  Depression/Anxiety Cont celexa No SI/HI Prn klonopin & atarax  Msk pain Hold flexeril Cont gabapentin, dilaudid  Hypothyroidism Cont OP synthroid 50 mcg qd No signs of hyper or hypothyroidism  Gastroparesis TID reglan  Code Status: FC  DVT Prophylaxis: heparin Family Communication: none available Disposition Plan: Pending Improvement  Status: tele obs  Haydee Salter, MD Family Medicine [facility] [email or web address] Password [email or web address]

## 2017-03-01 NOTE — ED Notes (Signed)
Patient needed to clean up due to BM in clothes.  Given soap, towels, and washcloths so he could take a shower.

## 2017-03-01 NOTE — ED Triage Notes (Signed)
Pt arrives to ED from PCP with complaints of nausea and hypertension since this morning. EMS reports pt has MWF dialysis, has not missed. Pt alert and oriented, ambulatory to stretcher. Pt placed in position of comfort with bed locked and lowered, call bell in reach.

## 2017-03-01 NOTE — Patient Instructions (Signed)
We will place an order for Levaquin 500 mg every other day to be taken after dialysis for 14 days We will order Zofran 4 mg p.o. as needed 3 times daily for nausea Will order a PET CT to be done in 4-6 weeks. Follow-up in clinic after scan.

## 2017-03-01 NOTE — Progress Notes (Addendum)
Russell [name]    [id number]    07/03/1970  Primary Care Physician:Shaw, Levell July, MD  Referring Physician: [name], MD [address] [place], [place] [address]  Chief complaint: Consult for abnormal CT scan  HPI: 47 year old with complicated medical history including end-stage renal disease secondary to nephrotic syndrome, on hemodialysis MWF, hypothyroidism, diabetes mellitus, coronary artery disease status post CABG, CIDP  He had a recent diagnosis of CIDP and has been started on IVIG for the past 1 month.  He follows with Dr. Anne Hahn, neurology Has history of end-stage renal disease and is on dialysis at [facility].  Recent CT scan shows right upper lobe consolidative mass and he has been referred here for further evaluation He has been treated with courses of antibiotic recently.  Most recently Vanco and ceftaz were ordered with dialysis on 2/22.  Patient got 1 g of Vanco and stop therapy as he became nauseous.  He declined [place] therapy and does not want any more IV antibiotics.  Denies any cough, sputum production, fevers, chills.  He has chronic fatigue, nausea.  No vomiting Denies any fevers, chills.  Pets: Dogs, no cats, birds, farm animals Occupation: Used to work as a Data processing manager and a Teacher, English as a foreign language.  Disabled for the past 2 years Exposures: They have been exposed to mold at work.  No exposures of the past 2 years.  No mold at home, no hot tubs Smoking history: Never smoker Travel History: No recent travel.  Lived in [place] all his life.  Outpatient Encounter Medications as of 03/01/2017  Medication Sig  . acetaminophen (TYLENOL) 500 MG tablet Take 1 tablet (500 mg total) by mouth every 6 (six) hours as needed for moderate pain. (Patient taking differently: Take 1,000 mg by mouth every 6 (six) hours as needed (for pain). )  . albuterol (PROVENTIL HFA;VENTOLIN HFA) 108 (90 Base) MCG/ACT inhaler Inhale 1 puff into the lungs every 6 (six)  hours as needed for wheezing or shortness of breath. (Patient taking differently: Inhale 2 puffs into the lungs every 8 (eight) hours as needed for wheezing or shortness of breath. )  . albuterol (PROVENTIL) (2.5 MG/3ML) 0.083% nebulizer solution Take 3 mLs (2.5 mg total) by nebulization every 4 (four) hours as needed for wheezing.  Marland Kitchen ALPRAZolam (XANAX) 0.5 MG tablet Take 2 tablets approximately 45 minutes prior to the MRI study, take a third tablet if needed.  Marland Kitchen amLODipine (NORVASC) 5 MG tablet Take 1 tablet (5 mg total) by mouth as directed. Take twice a day on dialysis days and once a day on other days.  Marland Kitchen aspirin EC 81 MG EC tablet Take 1 tablet (81 mg total) by mouth daily.  Marland Kitchen atorvastatin (LIPITOR) 80 MG tablet Take 1 tablet (80 mg total) by mouth daily at 6 PM.  . calcitRIOL (ROCALTROL) 0.25 MCG capsule Take 1 capsule (0.25 mcg total) by mouth every Monday, Wednesday, and Friday with hemodialysis. (Patient taking differently: Take 0.25 mcg by mouth See admin instructions. Tuesday Thursday Saturday)  . calcium acetate (PHOSLO) 667 MG capsule Take 2 capsules (1,334 mg total) by mouth 3 (three) times daily with meals. (Patient taking differently: Take 1,334-2,001 mg by mouth See admin instructions. Take 2001 mg by mouth 3 times daily with meals and take 1334 mg by mouth with snacks)  . cinacalcet (SENSIPAR) 30 MG tablet Take 30 mg by mouth every Monday, Wednesday, and Friday.  . citalopram (CELEXA) 40 MG  tablet Take 40 mg by mouth at bedtime.   . clonazePAM (KLONOPIN) 2 MG tablet Take 2 mg by mouth as needed.   . cyclobenzaprine (FLEXERIL) 10 MG tablet Take 1 tablet (10 mg total) 3 times/day as needed-between meals & bedtime by mouth for muscle spasms.  Marland Kitchen gabapentin (NEURONTIN) 100 MG capsule Take 2 capsules (200 mg total) at bedtime by mouth.  . hydrALAZINE (APRESOLINE) 25 MG tablet Take 1 tablet (25 mg total) by mouth 3 (three) times daily as needed (take for blood pressure above 160 if not  controlled with home meds).  Marland Kitchen HYDROcodone-acetaminophen (NORCO/VICODIN) 5-325 MG tablet Take 1-2 tablets by mouth every 4 (four) hours as needed for moderate pain.  Marland Kitchen HYDROmorphone (DILAUDID) 2 MG tablet Take 2 mg by mouth 3 (three) times daily.  . hydrOXYzine (ATARAX/VISTARIL) 25 MG tablet Take 25 mg by mouth 2 (two) times daily as needed for anxiety.   . Immune Globulin, Human, (PRIVIGEN) 40 GM/400ML SOLN Inject 40 g into the vein. 40gm/dayx 5 days and then 40gm q4 weeks  . insulin glargine (LANTUS) 100 UNIT/ML injection Inject 0.3 mLs (30 Units total) into the skin at bedtime. Pens  . insulin lispro (HUMALOG) 100 UNIT/ML injection Inject 0.15-0.3 mLs (15-30 Units total) into the skin every evening. Take one unit per 5 carbs depending on dinner.  . levothyroxine (SYNTHROID, LEVOTHROID) 50 MCG tablet Take 1 tablet (50 mcg total) by mouth daily before breakfast.  . liraglutide (VICTOZA) 18 MG/3ML SOPN Inject 0.3 mLs (1.8 mg total) into the skin daily before breakfast.  . metoCLOPramide (REGLAN) 5 MG tablet Take 1 tablet (5 mg total) by mouth every 8 (eight) hours as needed for nausea or vomiting. (Patient taking differently: Take 10 mg by mouth 3 (three) times daily. )  . metoprolol tartrate (LOPRESSOR) 100 MG tablet Take 1 tab twice a day on dialysis days and once a day on other days.  . [DISCONTINUED] albuterol-ipratropium (COMBIVENT) 18-103 MCG/ACT inhaler Inhale 2 puffs into the lungs every 4 (four) hours. (Patient taking differently: Inhale 2 puffs into the lungs 3 (three) times daily as needed for wheezing or shortness of breath. )   No facility-administered encounter medications on file as of 03/01/2017.     Allergies as of 03/01/2017 - Review Complete 03/01/2017  Allergen Reaction Noted  . Nsaids Other (See Comments) 02/13/2015  . Tape Other (See Comments) 04/27/2016    Past Medical History:  Diagnosis Date  . Anxiety   . Asthma   . CIDP (chronic inflammatory demyelinating  polyneuropathy) (HCC) 01/10/2017  . 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 Daily headache   . Depression   . DVT (deep venous thrombosis), H/o 01/2014-on Xarelto 03/24/2014   LLE  . ESRD (end stage renal disease) on dialysis [facility])    "[place], MWF" (06/29/2016)  . Gait abnormality 12/22/2016  . Hypertension   . Hypothyroidism   . Nephrotic syndrome 05/18/2014  . Quadriparesis (HCC) 12/22/2016  . Type 2 diabetes mellitus with diabetic nephropathy [facility])     Past Surgical History:  Procedure Laterality Date  . ANKLE FRACTURE SURGERY Right 1988  . AV FISTULA PLACEMENT Left 01/01/2015   Procedure: CREATION OF LEFT RADIAL CEPHALIC ARTERIOVENOUS (AV) FISTULA ;  Surgeon: Pryor Ochoa, MD;  Location: [facility] OR;  Service: Vascular;  Laterality: Left;  . CAPD REMOVAL N/A 05/19/2016  Procedure: PD CATH REMOVAL;  Surgeon: Abigail MiyamotoBlackman, Douglas, MD;  [facility];  Service: General;  Laterality: N/A;  . CARDIAC CATHETERIZATION N/A 09/22/2015   Procedure: Left Heart Cath and Coronary Angiography;  Surgeon: Iran OuchMuhammad A Arida, MD;  Location: [facility] INVASIVE CV LAB;  Service: Cardiovascular;  Laterality: N/A;  . CORONARY ARTERY BYPASS GRAFT N/A 09/28/2015   Procedure: CORONARY ARTERY BYPASS GRAFTING (CABG) x 3 UTILIZING LEFT MAMMARY ARTERY AND ENDOSCOPICALLY HARVESTED LEFT GREATER SAPHENOUS VEIN.;  Surgeon: Delight OvensEdward B Gerhardt, MD;  Location: [facility] OR;  Service: Open Heart Surgery;  Laterality: N/A;  . ENDOVEIN HARVEST OF GREATER SAPHENOUS VEIN Left 09/28/2015   Procedure: ENDOVEIN HARVEST OF GREATER SAPHENOUS VEIN;  Surgeon: Delight OvensEdward B Gerhardt, MD;  [facility];  Service: Open Heart Surgery;  Laterality: Left;  . ESOPHAGOGASTRODUODENOSCOPY N/A 03/29/2014   Procedure: ESOPHAGOGASTRODUODENOSCOPY (EGD);  Surgeon: Dorena CookeyJohn Hayes, MD;  [facility];  Service: Endoscopy;   Laterality: N/A;  . EYE SURGERY    . FRACTURE SURGERY    . INSERTION OF DIALYSIS CATHETER Right 01/05/2015   Procedure: INSERTION OF RIGHT INTERNAL JUGULAR DIALYSIS CATHETER;  Surgeon: Fransisco HertzBrian L Chen, MD;  [facility];  Service: Vascular;  Laterality: Right;  . LEFT HEART CATH AND CORS/GRAFTS ANGIOGRAPHY N/A 09/13/2016   Procedure: LEFT HEART CATH AND CORS/GRAFTS ANGIOGRAPHY;  Surgeon: Kathleene HazelMcAlhany, Christopher D, MD;  Location: [facility] INVASIVE CV LAB;  Service: Cardiovascular;  Laterality: N/A;  . RETINAL LASER PROCEDURE Bilateral   . TEE WITHOUT CARDIOVERSION N/A 09/28/2015   Procedure: TRANSESOPHAGEAL ECHOCARDIOGRAM (TEE);  Surgeon: Delight OvensEdward B Gerhardt, MD;  [facility];  Service: Open Heart Surgery;  Laterality: N/A;  . TONSILLECTOMY AND ADENOIDECTOMY  1970s    Family History  Problem Relation Age of Onset  . Obesity Mother        Patient states that family members have no other medical illnesses other than what I have described  . Kidney cancer Maternal Grandmother   . Cancer Father 50       AML  . Heart disease Unknown        No family history    Social History   Socioeconomic History  . Marital status: Single    Spouse name: Not on file  . Number of children: 0  . Years of education: College  . Highest education level: Not on file  Social Needs  . Financial resource strain: Not on file  . Food insecurity - worry: Not on file  . Food insecurity - inability: Not on file  . Transportation needs - medical: Not on file  . Transportation needs - non-medical: Not on file  Occupational History  . Occupation: Disabled    Comment: Data processing managerMaintenance mechanic  Tobacco Use  . Smoking status: Never Smoker  . Smokeless tobacco: Never Used  Substance and Sexual Activity  . Alcohol use: Yes    Alcohol/week: 0.0 oz    Comment: 06/29/2016 "might have a few drinks/year"  . Drug use: No  . Sexual activity: Yes  Other Topics Concern  . Not on file  Social History Narrative   Patient currently  lives with his fiance   Works with facilities   Nonsmoker, nondrinker   Caffeine use: none   Right handed     Review of systems: Review of Systems  Constitutional: Negative for fever and chills.  HENT: Negative.   Eyes: Negative for blurred vision.  Respiratory: as per HPI  Cardiovascular: Negative for chest pain and palpitations.  Gastrointestinal: Negative for vomiting,  diarrhea, blood per rectum. Genitourinary: Negative for dysuria, urgency, frequency and hematuria.  Musculoskeletal: Negative for myalgias, back pain and joint pain.  Skin: Negative for itching and rash.  Neurological: Negative for dizziness, tremors, focal weakness, seizures and loss of consciousness.  Endo/Heme/Allergies: Negative for environmental allergies.  Psychiatric/Behavioral: Negative for depression, suicidal ideas and hallucinations.  All other systems reviewed and are negative.  Physical Exam: Blood pressure (!) 168/102, pulse (!) 102, height 6\' 2"  (1.88 m), weight 251 lb (113.9 kg), SpO2 96 %. Gen:      No acute distress HEENT:  EOMI, sclera anicteric Neck:     No masses; no thyromegaly Lungs:    Clear to auscultation bilaterally; normal respiratory effort CV:         Regular rate and rhythm; no murmurs Abd:      + bowel sounds; soft, non-tender; no palpable masses, no distension Ext:    No edema; adequate peripheral perfusion Skin:      Warm and dry; no rash Neuro: alert and oriented x 3 Psych: normal mood and affect  Data Reviewed: CT chest 05/16/16- no pulmonary embolism, scattered groundglass opacity, enlargement of right paratracheal lymph nodes PET scan 07/27/16-improvement in mediastinal lymph nodes, no activity. CT scan 10/14/16-no pulmonary embolism, no lung abnormalities, mild mediastinal lymphadenopathy CT scan 02/22/17-3 cm consolidative mass in the right upper lobe with air bronchograms.  Mild improvement in mediastinal lymph nodes I have reviewed the images personally  Assessment:    Right upper lobe mass The mass was not seen on CT scan 4 months ago.  It appears to have come on rather quickly to be a malignancy.  No obvious risk factors for lung cancer as he is a non-smoker. CT images shows consolidation with air bronchograms which may indicate inflammatory, infectious process We will treat him with PO antibiotics, Levaquin for 14 days.  He is refusing IV antibiotics at his dialysis center  Reassess with a PET/CT in 4-6 weeks.  If persistent then we may need to consider a biopsy.  However with his comorbidities he will be high risk We will need to get cardiology clearance if we are to proceed with biopsy Get super D CT made of his recent CT in case he needs a navigational biopsy   Discussed over the phone with Dr. Hyman Hopes and Dr. Signe Colt, Nephrology  Mediastinal lymphadenopathy Noted on CT chest 05/16/16 and has improved in size. PET scan was negative last year. Will reassess this on follow-up imaging.  Plan/Recommendations: - Levaquin for 14 days 500 mg PO every other day after dialysis. - Follow-up PET CT in 4-6 weeks - Zofran for nausea  Chilton Greathouse MD [facility] Pager [phone] 03/01/2017, 10:12 AM  CC: [name], MD   Addendum: Pt has significant nausea and developed emesis with elevated BP and HR Blood sugar is 366.  EMS called. He will be sent to [facility] ED for hyperglycemia, needs eval for DKA.  Chilton Greathouse MD  Pulmonary and Critical Care Pager [phone] If no answer or after 3pm call: [phone] 03/01/2017, 12:30 PM

## 2017-03-01 NOTE — Progress Notes (Signed)
Pharmacy Antibiotic Note  Fonnie BirkenheadSteven B Dengel is a 47 y.o. male admitted on 03/01/2017 with pneumonia.  Pharmacy has been consulted for vancomycin and cefepime dosing.  Has hx of ESRD on HD MWF - was receiving abx recently (last dose of IV vanc 1 g on 2/22) - with no missed HD sessions. Found to have consolidation with air bronchograms on CT images with original plan to treat with levaquin per pulmonology notes this morning. Came into ED due to elevated BP/HR.   WBC WNL. Influenza negative. LA is 1.75. Afebrile. Received 2 g cefepime and 2.5 g of vanc in ED.   Plan: Will order vancomycin 1000 mg post-HD  Will order 1 gram cefepime IV every 24 hours Monitor HD schedule, clinical pictures, cx results, and opportunities for de-escalations  Height: 6\' 2"  (188 cm) Weight: 250 lb (113.4 kg) IBW/kg (Calculated) : 82.2  Temp (24hrs), Avg:98.2 F (36.8 C), Min:98.2 F (36.8 C), Max:98.2 F (36.8 C)  Recent Labs  Lab 03/01/17 1312 03/01/17 1615  WBC 8.2  --   CREATININE 6.90*  --   LATICACIDVEN  --  1.75    Estimated Creatinine Clearance: 17.9 mL/min (A) (by C-G formula based on SCr of 6.9 mg/dL (H)).    Allergies  Allergen Reactions  . Nsaids Other (See Comments)    Told to avoid all nsaids due to kidney disease   . Tape Other (See Comments)    Welts result, if left for a long amount of time    Antimicrobials this admission: Vancomycin 2/28 >>  Cefepime 2/28 >>   Dose adjustments this admission: N/A  Microbiology results: 2/28 Sputum: sent   Thank you for allowing pharmacy to be a part of this patient's care.  Girard CooterKimberly Perkins, PharmD Clinical Pharmacist  Pager: [phone] Phone: [phone] 03/01/2017 7:37 PM

## 2017-03-01 NOTE — ED Notes (Signed)
Per EDP (Dr. Madilyn Hookees), since blood cultures were attempted and unsuccessful, that IV antibiotics are more important so antibiotics can be started without blood cultures being done.

## 2017-03-01 NOTE — ED Notes (Signed)
Nurse unable to draw blood off patient for blood cultures due to limited access.

## 2017-03-01 NOTE — ED Provider Notes (Signed)
Patient visit shared.  Patient with ESRD on hemodialysis here for evaluation of cough, nausea, vomiting, inability to tolerate oral medications.  CT scan is concerning for pneumonia, he was treated with antibiotics.  He did have accelerated hypertension on ED arrival, improved after IV and oral treatment.  Medicine consulted for admission for further treatment.   Russell [name], Faviola Klare, MD 03/02/17 [phone]

## 2017-03-01 NOTE — ED Provider Notes (Signed)
[facility] [address] Provider Note   CSN: [id number] Arrival date & time: 03/01/17  1300     History   Chief Complaint Chief Complaint  Patient presents with  . Nausea  . Hypertension    HPI Russell [name] is a 47 y.o. male.  HPI  47 year old male comes in with multiple complaint.  Pt specifically complained of nausea, emesis, elevated blood pressure, chest pain, dib.  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  Patient states that he started having chest pain that is midsternal about 3 weeks ago.  Patient's chest pain is intermittent, and typically evoked by coughing.  Patient does not have any chest pain with deep inspiration.  Cough is nonproductive.  Patient does have some mild URI like symptoms associated with the cough.  Patient started having shortness of breath, nausea, vomiting about 1 week ago.  Patient describes his dyspnea as exertional.  Patient was asymptomatic when he had his ACS.  Patient also has history of DVT, he is not on any anticoagulation at this time.  Finally patient is complaining of nausea with emesis over the last few days.  Patient is having bilious emesis that is nonbloody.  Patient does not have any associated abdominal pain.  Patient has been unable to tolerate orals over the last 2 days, including his medications.  Past Medical History:  Diagnosis Date  . Anxiety   . Asthma   . 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 Daily headache   . Depression   . DVT (deep venous thrombosis), H/o 01/2014-on Xarelto 03/24/2014   LLE  . ESRD (end stage renal disease) on dialysis [facility])    "[place], MWF" (06/29/2016)  . Gait  abnormality 12/22/2016  . Gastroparesis 12/22/2016  . Hypertension   . Hypothyroidism   . Nephrotic syndrome 05/18/2014  . Neuropathy   . Type 2 diabetes mellitus with diabetic nephropathy [facility])     Patient Active Problem List   Diagnosis Date Noted  . CAP (community acquired pneumonia) 03/01/2017  . Anxiety 02/06/2017  . Asthma 02/06/2017  . 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 Hyperlipidemia 02/06/2017  . Macular degeneration 02/06/2017  . Neuropathy 02/06/2017  . Restrictive airway disease 02/06/2017  . 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 Quadriparesis (HCC) 12/22/2016  . Gait abnormality 12/22/2016  . Hypertensive urgency 10/13/2016  . Acute pulmonary edema (HCC)   . Anemia in chronic kidney disease, on chronic dialysis (HCC)   . Weakness   . Hyperkalemia 09/26/2016  . History of DVT of lower extremity 08/17/2016  . Arteriosclerotic vascular disease 08/17/2016  . DKA, type 1 (HCC) 06/29/2016  . Nausea vomiting and diarrhea 06/29/2016  . Malignant hypertension 06/29/2016  . Chronic respiratory failure (HCC) 05/16/2016  . Anemia due to end stage renal disease (HCC) 05/16/2016  . Pre-transplant evaluation for kidney transplant 04/25/2016  . S/P CABG (coronary artery bypass graft) 09/28/2015  . Coronary artery disease involving native coronary artery of native heart without angina pectoris   . ESRD on dialysis (HCC) 02/13/2015  . Nephrotic syndrome 01/03/2015  . Essential hypertension, benign   . Diabetic ketoacidosis without coma associated with type 2 diabetes mellitus (HCC)   . Hx  of gastroesophageal reflux (GERD) 09/30/2014  . Diabetic neuropathy (HCC) 09/21/2014  . Uncontrolled diabetes mellitus type 2 with peripheral artery disease (HCC) 09/21/2014  . Morbid obesity (HCC) 09/21/2014  . Proteinuria 07/09/2014  . Chest pain 04/22/2014  . Superficial thrombophlebitis 03/24/2014  . Hypothyroidism (acquired) 03/24/2014    Past  Surgical History:  Procedure Laterality Date  . ANKLE FRACTURE SURGERY Right 1988  . AV FISTULA PLACEMENT Left 01/01/2015   Procedure: CREATION OF LEFT RADIAL CEPHALIC ARTERIOVENOUS (AV) FISTULA ;  Surgeon: Pryor Ochoa, MD;  Location: [facility] OR;  Service: Vascular;  Laterality: Left;  . CAPD REMOVAL N/A 05/19/2016   Procedure: PD CATH REMOVAL;  Surgeon: Abigail Miyamoto, MD;  Location: [facility] OR;  Service: General;  Laterality: N/A;  . CARDIAC CATHETERIZATION N/A 09/22/2015   Procedure: Left Heart Cath and Coronary Angiography;  Surgeon: Iran Ouch, MD;  Location: [facility] INVASIVE CV LAB;  Service: Cardiovascular;  Laterality: N/A;  . CORONARY ARTERY BYPASS GRAFT N/A 09/28/2015   Procedure: CORONARY ARTERY BYPASS GRAFTING (CABG) x 3 UTILIZING LEFT MAMMARY ARTERY AND ENDOSCOPICALLY HARVESTED LEFT GREATER SAPHENOUS VEIN.;  Surgeon: Delight Ovens, MD;  Location: [facility] OR;  Service: Open Heart Surgery;  Laterality: N/A;  . ENDOVEIN HARVEST OF GREATER SAPHENOUS VEIN Left 09/28/2015   Procedure: ENDOVEIN HARVEST OF GREATER SAPHENOUS VEIN;  Surgeon: Delight Ovens, MD;  Location: [facility] OR;  Service: Open Heart Surgery;  Laterality: Left;  . ESOPHAGOGASTRODUODENOSCOPY N/A 03/29/2014   Procedure: ESOPHAGOGASTRODUODENOSCOPY (EGD);  Surgeon: Dorena Cookey, MD;  Location: [facility];  Service: Endoscopy;  Laterality: N/A;  . EYE SURGERY    . FRACTURE SURGERY    . INSERTION OF DIALYSIS CATHETER Right 01/05/2015   Procedure: INSERTION OF RIGHT INTERNAL JUGULAR DIALYSIS CATHETER;  Surgeon: Fransisco Hertz, MD;  Location: [facility] OR;  Service: Vascular;  Laterality: Right;  . LEFT HEART CATH AND CORS/GRAFTS ANGIOGRAPHY N/A 09/13/2016   Procedure: LEFT HEART CATH AND CORS/GRAFTS ANGIOGRAPHY;  Surgeon: Kathleene Hazel, MD;  Location: [facility] INVASIVE CV LAB;  Service: Cardiovascular;  Laterality: N/A;  . RETINAL LASER PROCEDURE Bilateral   . TEE WITHOUT CARDIOVERSION N/A 09/28/2015   Procedure: TRANSESOPHAGEAL ECHOCARDIOGRAM  (TEE);  Surgeon: Delight Ovens, MD;  Location: [facility] OR;  Service: Open Heart Surgery;  Laterality: N/A;  . TONSILLECTOMY AND ADENOIDECTOMY  1970s       Home Medications    Prior to Admission medications   Medication Sig Start Date End Date Taking? Authorizing Provider  acetaminophen (TYLENOL) 500 MG tablet Take 1 tablet (500 mg total) by mouth every 6 (six) hours as needed for moderate pain. Patient taking differently: Take 1,000 mg by mouth every 6 (six) hours as needed (for pain).  09/29/14  Yes Albertine Grates, MD  albuterol (PROVENTIL HFA;VENTOLIN HFA) 108 (90 Base) MCG/ACT inhaler Inhale 1 puff into the lungs every 6 (six) hours as needed for wheezing or shortness of breath. Patient taking differently: Inhale 2 puffs into the lungs every 8 (eight) hours as needed for wheezing or shortness of breath.  03/11/15  Yes [name], MD  albuterol (PROVENTIL) (2.5 MG/3ML) 0.083% nebulizer solution Take 3 mLs (2.5 mg total) by nebulization every 4 (four) hours as needed for wheezing. 09/20/16  Yes [name], MD  aspirin EC 81 MG EC tablet Take 1 tablet (81 mg total) by mouth daily. 10/06/15  Yes Barrett, Erin R, PA-C  atorvastatin (LIPITOR) 80 MG tablet Take 1 tablet (80 mg total) by mouth daily at 6 PM.  01/05/17  Yes Azalee CourseMeng, Hao, PA  calcitRIOL (ROCALTROL) 0.25 MCG capsule Take 1 capsule (0.25 mcg total) by mouth every Monday, Wednesday, and Friday with hemodialysis. Patient taking differently: Take 0.25 mcg by mouth See admin instructions. Tuesday Thursday Saturday 10/06/15  Yes Barrett, Erin R, PA-C  calcium acetate (PHOSLO) 667 MG capsule Take 2 capsules (1,334 mg total) by mouth 3 (three) times daily with meals. Patient taking differently: Take 1,334-2,001 mg by mouth See admin instructions. Take 2001 mg by mouth 3 times daily with meals and take 1334 mg by mouth with snacks 01/08/15  Yes Rolly SalterPatel, Pranav M, MD  citalopram (CELEXA) 40 MG tablet Take 40 mg by mouth at bedtime.    Yes [provider]  clonazePAM (KLONOPIN) 2 MG tablet Take 2 mg by mouth as needed.    Yes [provider]  cyclobenzaprine (FLEXERIL) 10 MG tablet Take 1 tablet (10 mg total) 3 times/day as needed-between meals & bedtime by mouth for muscle spasms. 11/07/16  Yes Sherren MochaShaw, Eva N, MD  gabapentin (NEURONTIN) 100 MG capsule Take 2 capsules (200 mg total) at bedtime by mouth. Patient taking differently: Take 200 mg by mouth 3 (three) times daily.  11/07/16  Yes Sherren MochaShaw, Eva N, MD  hydrALAZINE (APRESOLINE) 25 MG tablet Take 1 tablet (25 mg total) by mouth 3 (three) times daily as needed (take for blood pressure above 160 if not controlled with home meds). 10/15/16 10/15/17 Yes Rolly SalterPatel, Pranav M, MD  HYDROcodone-acetaminophen (NORCO/VICODIN) 5-325 MG tablet Take 1-2 tablets by mouth every 4 (four) hours as needed for moderate pain. 08/17/16  Yes Sherren MochaShaw, Eva N, MD  HYDROmorphone (DILAUDID) 2 MG tablet Take 2 mg by mouth 3 (three) times daily.   Yes [provider]  hydrOXYzine (ATARAX/VISTARIL) 25 MG tablet Take 25 mg by mouth 2 (two) times daily as needed for anxiety.    Yes [provider]  Immune Globulin, Human, (PRIVIGEN) 40 GM/400ML SOLN Inject 40 g into the vein. 40gm/dayx 5 days and then 40gm q4 weeks   Yes [provider]  insulin glargine (LANTUS) 100 UNIT/ML injection Inject 0.3 mLs (30 Units total) into the skin at bedtime. [place] 01/05/17  Yes Carlus PavlovGherghe, Cristina, MD  insulin lispro (HUMALOG) 100 UNIT/ML injection Inject 0.15-0.3 mLs (15-30 Units total) into the skin every evening. Take one unit per 5 carbs depending on dinner. 11/29/15  Yes Carlus PavlovGherghe, Cristina, MD  levothyroxine (SYNTHROID, LEVOTHROID) 50 MCG tablet Take 1 tablet (50 mcg total) by mouth daily before breakfast. 01/05/17  Yes Carlus PavlovGherghe, Cristina, MD  liraglutide (VICTOZA) 18 MG/3ML SOPN Inject 0.3 mLs (1.8 mg total) into the skin daily before breakfast. 01/05/17  Yes Carlus PavlovGherghe, Cristina, MD  metoCLOPramide (REGLAN) 5 MG tablet Take 1 tablet  (5 mg total) by mouth every 8 (eight) hours as needed for nausea or vomiting. Patient taking differently: Take 10 mg by mouth 3 (three) times daily.  01/10/16  Yes Dong, Robin, DO  metoprolol tartrate (LOPRESSOR) 100 MG tablet Take 1 tab twice a day on dialysis days and once a day on other days. 01/05/17  Yes Azalee CourseMeng, Hao, PA  ondansetron (ZOFRAN) 4 MG tablet Take 1 tablet (4 mg total) by mouth 3 (three) times daily as needed for nausea or vomiting. 03/01/17  Yes Mannam, Praveen, MD  cinacalcet (SENSIPAR) 30 MG tablet Take 30 mg by mouth every Monday, Wednesday, and Friday.    [provider]  levofloxacin (LEVAQUIN) 500 MG tablet Take 1 daily every other day after dialysis 03/01/17  Chilton Greathouse, MD    Family History Family History  Problem Relation Age of Onset  . Obesity Mother        Patient states that family members have no other medical illnesses other than what I have described  . Kidney cancer Maternal Grandmother   . Cancer Father 50       AML  . Heart disease Unknown        No family history    Social History Social History   Tobacco Use  . Smoking status: Never Smoker  . Smokeless tobacco: Never Used  Substance Use Topics  . Alcohol use: Yes    Alcohol/week: 0.0 oz    Comment: 06/29/2016 "might have a few drinks/year"  . Drug use: No     Allergies   Nsaids and Tape   Review of Systems Review of Systems  Constitutional: Positive for activity change.  Respiratory: Positive for cough and shortness of breath.   Cardiovascular: Positive for chest pain.  Gastrointestinal: Positive for diarrhea, nausea and vomiting.  Allergic/Immunologic: Negative for immunocompromised state.  Hematological: Does not bruise/bleed easily.  All other systems reviewed and are negative.    Physical Exam Updated Vital Signs BP 139/83 (BP Location: Right Arm)   Pulse 81   Temp 97.9 F (36.6 C) (Oral)   Resp 18   Ht 6\' 2"  (1.88 m)   Wt 115.1 kg (253 lb 12 oz)   SpO2 97%    BMI 32.58 kg/m   Physical Exam  Constitutional: He is oriented to person, place, and time. He appears well-developed.  HENT:  Head: Atraumatic.  Neck: Neck supple.  Cardiovascular: Normal rate.  Pulmonary/Chest: Effort normal. He has wheezes. He has rales.  Abdominal: Soft. There is no tenderness.  Musculoskeletal: He exhibits no edema.  Neurological: He is alert and oriented to person, place, and time.  Skin: Skin is warm.  Nursing note and vitals reviewed.    ED Treatments / Results  Labs (all labs ordered are listed, but only abnormal results are displayed) Labs Reviewed  COMPREHENSIVE METABOLIC PANEL - Abnormal; Notable for the following components:      Result Value   Sodium 131 (*)    Chloride 88 (*)    Glucose, Bld 337 (*)    BUN 29 (*)    Creatinine, Ser 6.90 (*)    ALT 14 (*)    GFR calc non Af Amer 9 (*)    GFR calc Af Amer 10 (*)    Anion gap 18 (*)    All other components within normal limits  CBC - Abnormal; Notable for the following components:   Hemoglobin 12.7 (*)    HCT 38.5 (*)    All other components within normal limits  PHOSPHORUS - Abnormal; Notable for the following components:   Phosphorus 6.8 (*)    All other components within normal limits  BETA-HYDROXYBUTYRIC ACID - Abnormal; Notable for the following components:   Beta-Hydroxybutyric Acid 1.08 (*)    All other components within normal limits  BASIC METABOLIC PANEL - Abnormal; Notable for the following components:   Sodium 132 (*)    Chloride 92 (*)    Glucose, Bld 256 (*)    BUN 37 (*)    Creatinine, Ser 7.88 (*)    GFR calc non Af Amer 7 (*)    GFR calc Af Amer 8 (*)    All other components within normal limits  CBC WITH DIFFERENTIAL/PLATELET - Abnormal; Notable for the following components:  RBC 3.86 (*)    Hemoglobin 11.3 (*)    HCT 34.9 (*)    RDW 15.7 (*)    All other components within normal limits  CBC - Abnormal; Notable for the following components:   Hemoglobin 12.7  (*)    HCT 38.1 (*)    All other components within normal limits  CREATININE, SERUM - Abnormal; Notable for the following components:   Creatinine, Ser 7.15 (*)    GFR calc non Af Amer 8 (*)    GFR calc Af Amer 10 (*)    All other components within normal limits  PHOSPHORUS - Abnormal; Notable for the following components:   Phosphorus 6.2 (*)    All other components within normal limits  GLUCOSE, CAPILLARY - Abnormal; Notable for the following components:   Glucose-Capillary 348 (*)    All other components within normal limits  GLUCOSE, CAPILLARY - Abnormal; Notable for the following components:   Glucose-Capillary 378 (*)    All other components within normal limits  GLUCOSE, CAPILLARY - Abnormal; Notable for the following components:   Glucose-Capillary 239 (*)    All other components within normal limits  GLUCOSE, CAPILLARY - Abnormal; Notable for the following components:   Glucose-Capillary 285 (*)    All other components within normal limits  GLUCOSE, CAPILLARY - Abnormal; Notable for the following components:   Glucose-Capillary 213 (*)    All other components within normal limits  GLUCOSE, CAPILLARY - Abnormal; Notable for the following components:   Glucose-Capillary 254 (*)    All other components within normal limits  GLUCOSE, CAPILLARY - Abnormal; Notable for the following components:   Glucose-Capillary 142 (*)    All other components within normal limits  BASIC METABOLIC PANEL - Abnormal; Notable for the following components:   Sodium 132 (*)    Chloride 91 (*)    Glucose, Bld 232 (*)    BUN 29 (*)    Creatinine, Ser 6.09 (*)    GFR calc non Af Amer 10 (*)    GFR calc Af Amer 12 (*)    All other components within normal limits  CBC - Abnormal; Notable for the following components:   RBC 4.14 (*)    Hemoglobin 12.3 (*)    HCT 37.8 (*)    RDW 15.8 (*)    Platelets 145 (*)    All other components within normal limits  GLUCOSE, CAPILLARY - Abnormal; Notable  for the following components:   Glucose-Capillary 252 (*)    All other components within normal limits  GLUCOSE, CAPILLARY - Abnormal; Notable for the following components:   Glucose-Capillary 294 (*)    All other components within normal limits  I-STAT VENOUS BLOOD GAS, ED - Abnormal; Notable for the following components:   pH, Ven 7.440 (*)    pO2, Ven 24.0 (*)    Bicarbonate 32.9 (*)    TCO2 34 (*)    Acid-Base Excess 7.0 (*)    All other components within normal limits  CBG MONITORING, ED - Abnormal; Notable for the following components:   Glucose-Capillary 270 (*)    All other components within normal limits  MRSA PCR SCREENING  CULTURE, EXPECTORATED SPUTUM-ASSESSMENT  GRAM STAIN  LIPASE, BLOOD  MAGNESIUM  INFLUENZA PANEL BY PCR (TYPE A & B)  HIV ANTIBODY (ROUTINE TESTING)  MAGNESIUM  STREP PNEUMONIAE URINARY ANTIGEN  I-STAT TROPONIN, ED  I-STAT TROPONIN, ED  I-STAT CG4 LACTIC ACID, ED    EKG  EKG Interpretation  Date/Time:  Thursday March 01 2017 13:11:50 EST Ventricular Rate:  102 PR Interval:    QRS Duration: 91 QT Interval:  378 QTC Calculation: 493 R Axis:   -3 Text Interpretation:  Sinus tachycardia Anterior infarct, old No acute changes Nonspecific ST and T wave abnormality Confirmed by Derwood Kaplan [phone]) on 03/01/2017 2:49:05 PM       Radiology No results found.  Procedures Procedures (including critical care time)  Medications Ordered in ED Medications  cinacalcet (SENSIPAR) tablet 30 mg (30 mg Oral Not Given 03/02/17 1054)  aspirin EC tablet 81 mg (81 mg Oral Given 03/03/17 1045)  atorvastatin (LIPITOR) tablet 80 mg (80 mg Oral Given 03/03/17 1756)  citalopram (CELEXA) tablet 40 mg (40 mg Oral Given 03/03/17 2141)  clonazePAM (KLONOPIN) tablet 2 mg (not administered)  gabapentin (NEURONTIN) capsule 200 mg (200 mg Oral Given 03/03/17 2142)  hydrOXYzine (ATARAX/VISTARIL) tablet 25 mg (not administered)  insulin glargine (LANTUS) injection 20 Units  (20 Units Subcutaneous Given 03/03/17 2141)  levothyroxine (SYNTHROID, LEVOTHROID) tablet 50 mcg (50 mcg Oral Given 03/03/17 0900)  metoprolol tartrate (LOPRESSOR) tablet 100 mg (100 mg Oral Given 03/03/17 2142)  heparin injection 5,000 Units (5,000 Units Subcutaneous Not Given 03/03/17 2143)  ceFEPIme (MAXIPIME) 1 g in sodium chloride 0.9 % 100 mL IVPB (1 g Intravenous New Bag/Given 03/03/17 1752)  acetaminophen (TYLENOL) tablet 650 mg (650 mg Oral Given 03/03/17 1435)  ondansetron (ZOFRAN) injection 4 mg (4 mg Intravenous Given 03/02/17 1259)  calcitRIOL (ROCALTROL) capsule 1.25 mcg (1.25 mcg Oral Not Given 03/02/17 1803)  ferric gluconate (NULECIT) 62.5 mg in sodium chloride 0.9 % 100 mL IVPB (not administered)  calcium acetate (PHOSLO) capsule 2,001 mg (2,001 mg Oral Given 03/03/17 1741)  calcium acetate (PHOSLO) capsule 1,334 mg (not administered)  metoCLOPramide (REGLAN) tablet 5 mg (not administered)  insulin aspart (novoLOG) injection 0-9 Units (5 Units Subcutaneous Given 03/03/17 1736)  insulin aspart (novoLOG) injection 0-5 Units (2 Units Subcutaneous Given 03/03/17 2208)  HYDROmorphone (DILAUDID) tablet 2 mg (2 mg Oral Given 03/03/17 2209)  dextromethorphan-guaiFENesin (MUCINEX DM) 30-600 MG per 12 hr tablet 1 tablet (1 tablet Oral Given 03/03/17 2142)  ondansetron (ZOFRAN) 4 MG/2ML injection (not administered)  doxycycline (VIBRA-TABS) tablet 100 mg (100 mg Oral Given 03/03/17 2142)  ipratropium-albuterol (DUONEB) 0.5-2.5 (3) MG/3ML nebulizer solution 3 mL (not administered)  pantoprazole (PROTONIX) injection 40 mg (40 mg Intravenous Given 03/03/17 2141)  ondansetron (ZOFRAN) injection 4 mg (4 mg Intravenous Given 03/01/17 1508)  sodium chloride 0.9 % bolus 500 mL (0 mLs Intravenous Stopped 03/01/17 1608)  iopamidol (ISOVUE-370) 76 % injection (100 mLs  Contrast Given 03/01/17 1533)  hydrALAZINE (APRESOLINE) injection 10 mg (10 mg Intravenous Given 03/01/17 1607)  hydrALAZINE (APRESOLINE) tablet 25 mg (25 mg Oral  Given 03/01/17 1607)  ceFEPIme (MAXIPIME) 2 g in sodium chloride 0.9 % 100 mL IVPB (0 g Intravenous Stopped 03/01/17 1752)  vancomycin (VANCOCIN) 2,500 mg in sodium chloride 0.9 % 500 mL IVPB (0 mg Intravenous Stopped 03/01/17 1917)  ondansetron (ZOFRAN) injection 4 mg (4 mg Intravenous Given 03/01/17 1911)     Initial Impression / Assessment and Plan / ED Course  I have reviewed the triage vital signs and the nursing notes.  Pertinent labs & imaging results that were available during my care of the patient were reviewed by me and considered in my medical decision making (see chart for details).  Clinical Course as of Mar 04 2214  Thu Mar 01, 2017  2213 ESRD hx. Creatinine: (!) 7.88 [  AN]  Sat Mar 03, 2017  2214 Questionable pneumonia. Code sepsis initiated. CT Angio Chest PE W and/or Wo Contrast [AN]  2215 Mild anion gap is not likely due to DKA. pH, Ven: (!) 7.440 [AN]    Clinical Course User Index [AN] Derwood Kaplan, MD    47 year old with multiple medical comorbidities, specifically CAD, end-stage renal disease, diabetes, CIDP comes in with chief complaint of shortness of breath and chest pain.  Patient also has remote history of DVT.    Patient's chest pain seems to be unrelated from the shortness of breath.  There is also associated cough.  We will get a chest x-ray, and a CT PE.  Influenza screen also sent given patient's URI-like symptoms along with cough and other nonspecific complaints.  Differential diagnosis from emergency perspective includes ACS and PE.  Patient is also having nausea, vomiting and loose bowel movements today.  Abdominal exam is benign.  Blood sugar is normal.  Patient does have mild anion gap of 18.  Venous blood gas however does not show any acidosis.  Doubt mixed acid-base disorder.  Beta hydroxybutyric acid is pending at this time.  Besides DKA, differential diagnosis includes gastroenteritis, influenza.  Final Clinical Impressions(s) / ED Diagnoses    Final diagnoses:  HCAP (healthcare-associated pneumonia)    ED Discharge Orders    None       Derwood Kaplan, MD 03/03/17 2216

## 2017-03-02 ENCOUNTER — Other Ambulatory Visit (HOSPITAL_COMMUNITY): Payer: Medicare Other

## 2017-03-02 DIAGNOSIS — I5043 Acute on chronic combined systolic (congestive) and diastolic (congestive) heart failure: Secondary | ICD-10-CM | POA: Diagnosis not present

## 2017-03-02 DIAGNOSIS — I25119 Atherosclerotic heart disease of native coronary artery with unspecified angina pectoris: Secondary | ICD-10-CM | POA: Diagnosis not present

## 2017-03-02 DIAGNOSIS — E785 Hyperlipidemia, unspecified: Secondary | ICD-10-CM | POA: Diagnosis present

## 2017-03-02 DIAGNOSIS — I25118 Atherosclerotic heart disease of native coronary artery with other forms of angina pectoris: Secondary | ICD-10-CM | POA: Diagnosis not present

## 2017-03-02 DIAGNOSIS — E1143 Type 2 diabetes mellitus with diabetic autonomic (poly)neuropathy: Secondary | ICD-10-CM | POA: Diagnosis present

## 2017-03-02 DIAGNOSIS — I25110 Atherosclerotic heart disease of native coronary artery with unstable angina pectoris: Secondary | ICD-10-CM | POA: Diagnosis present

## 2017-03-02 DIAGNOSIS — Z992 Dependence on renal dialysis: Secondary | ICD-10-CM | POA: Diagnosis not present

## 2017-03-02 DIAGNOSIS — T17320A Food in larynx causing asphyxiation, initial encounter: Secondary | ICD-10-CM | POA: Diagnosis not present

## 2017-03-02 DIAGNOSIS — E1165 Type 2 diabetes mellitus with hyperglycemia: Secondary | ICD-10-CM | POA: Diagnosis not present

## 2017-03-02 DIAGNOSIS — N2581 Secondary hyperparathyroidism of renal origin: Secondary | ICD-10-CM | POA: Diagnosis present

## 2017-03-02 DIAGNOSIS — E1151 Type 2 diabetes mellitus with diabetic peripheral angiopathy without gangrene: Secondary | ICD-10-CM | POA: Diagnosis not present

## 2017-03-02 DIAGNOSIS — J440 Chronic obstructive pulmonary disease with acute lower respiratory infection: Secondary | ICD-10-CM | POA: Diagnosis present

## 2017-03-02 DIAGNOSIS — D631 Anemia in chronic kidney disease: Secondary | ICD-10-CM | POA: Diagnosis not present

## 2017-03-02 DIAGNOSIS — J189 Pneumonia, unspecified organism: Secondary | ICD-10-CM | POA: Diagnosis not present

## 2017-03-02 DIAGNOSIS — I5021 Acute systolic (congestive) heart failure: Secondary | ICD-10-CM | POA: Diagnosis not present

## 2017-03-02 DIAGNOSIS — E8889 Other specified metabolic disorders: Secondary | ICD-10-CM | POA: Diagnosis present

## 2017-03-02 DIAGNOSIS — I10 Essential (primary) hypertension: Secondary | ICD-10-CM | POA: Diagnosis not present

## 2017-03-02 DIAGNOSIS — G6181 Chronic inflammatory demyelinating polyneuritis: Secondary | ICD-10-CM | POA: Diagnosis not present

## 2017-03-02 DIAGNOSIS — R1310 Dysphagia, unspecified: Secondary | ICD-10-CM | POA: Diagnosis not present

## 2017-03-02 DIAGNOSIS — K3184 Gastroparesis: Secondary | ICD-10-CM | POA: Diagnosis present

## 2017-03-02 DIAGNOSIS — I2510 Atherosclerotic heart disease of native coronary artery without angina pectoris: Secondary | ICD-10-CM | POA: Diagnosis not present

## 2017-03-02 DIAGNOSIS — E1121 Type 2 diabetes mellitus with diabetic nephropathy: Secondary | ICD-10-CM | POA: Diagnosis present

## 2017-03-02 DIAGNOSIS — E1142 Type 2 diabetes mellitus with diabetic polyneuropathy: Secondary | ICD-10-CM | POA: Diagnosis not present

## 2017-03-02 DIAGNOSIS — J154 Pneumonia due to other streptococci: Secondary | ICD-10-CM | POA: Diagnosis present

## 2017-03-02 DIAGNOSIS — Y95 Nosocomial condition: Secondary | ICD-10-CM | POA: Diagnosis present

## 2017-03-02 DIAGNOSIS — E1122 Type 2 diabetes mellitus with diabetic chronic kidney disease: Secondary | ICD-10-CM | POA: Diagnosis present

## 2017-03-02 DIAGNOSIS — F329 Major depressive disorder, single episode, unspecified: Secondary | ICD-10-CM | POA: Diagnosis present

## 2017-03-02 DIAGNOSIS — G8929 Other chronic pain: Secondary | ICD-10-CM | POA: Diagnosis present

## 2017-03-02 DIAGNOSIS — I132 Hypertensive heart and chronic kidney disease with heart failure and with stage 5 chronic kidney disease, or end stage renal disease: Secondary | ICD-10-CM | POA: Diagnosis present

## 2017-03-02 DIAGNOSIS — I272 Pulmonary hypertension, unspecified: Secondary | ICD-10-CM | POA: Diagnosis present

## 2017-03-02 DIAGNOSIS — N186 End stage renal disease: Secondary | ICD-10-CM | POA: Diagnosis not present

## 2017-03-02 DIAGNOSIS — E039 Hypothyroidism, unspecified: Secondary | ICD-10-CM | POA: Diagnosis not present

## 2017-03-02 DIAGNOSIS — F419 Anxiety disorder, unspecified: Secondary | ICD-10-CM | POA: Diagnosis present

## 2017-03-02 DIAGNOSIS — I120 Hypertensive chronic kidney disease with stage 5 chronic kidney disease or end stage renal disease: Secondary | ICD-10-CM | POA: Diagnosis not present

## 2017-03-02 DIAGNOSIS — I340 Nonrheumatic mitral (valve) insufficiency: Secondary | ICD-10-CM | POA: Diagnosis not present

## 2017-03-02 DIAGNOSIS — G8250 Quadriplegia, unspecified: Secondary | ICD-10-CM | POA: Diagnosis present

## 2017-03-02 DIAGNOSIS — I420 Dilated cardiomyopathy: Secondary | ICD-10-CM | POA: Diagnosis present

## 2017-03-02 DIAGNOSIS — Q631 Lobulated, fused and horseshoe kidney: Secondary | ICD-10-CM | POA: Diagnosis not present

## 2017-03-02 DIAGNOSIS — N049 Nephrotic syndrome with unspecified morphologic changes: Secondary | ICD-10-CM | POA: Diagnosis not present

## 2017-03-02 LAB — CBC WITH DIFFERENTIAL/PLATELET
Basophils Absolute: 0 10*3/uL (ref 0.0–0.1)
Basophils Relative: 0 %
EOS ABS: 0.1 10*3/uL (ref 0.0–0.7)
EOS PCT: 1 %
HCT: 34.9 % — ABNORMAL LOW (ref 39.0–52.0)
Hemoglobin: 11.3 g/dL — ABNORMAL LOW (ref 13.0–17.0)
LYMPHS ABS: 1.2 10*3/uL (ref 0.7–4.0)
LYMPHS PCT: 16 %
MCH: 29.3 pg (ref 26.0–34.0)
MCHC: 32.4 g/dL (ref 30.0–36.0)
MCV: 90.4 fL (ref 78.0–100.0)
MONOS PCT: 8 %
Monocytes Absolute: 0.6 10*3/uL (ref 0.1–1.0)
Neutro Abs: 5.4 10*3/uL (ref 1.7–7.7)
Neutrophils Relative %: 75 %
PLATELETS: 263 10*3/uL (ref 150–400)
RBC: 3.86 MIL/uL — ABNORMAL LOW (ref 4.22–5.81)
RDW: 15.7 % — ABNORMAL HIGH (ref 11.5–15.5)
WBC: 7.3 10*3/uL (ref 4.0–10.5)

## 2017-03-02 LAB — BASIC METABOLIC PANEL
Anion gap: 15 (ref 5–15)
BUN: 37 mg/dL — AB (ref 6–20)
CHLORIDE: 92 mmol/L — AB (ref 101–111)
CO2: 25 mmol/L (ref 22–32)
CREATININE: 7.88 mg/dL — AB (ref 0.61–1.24)
Calcium: 8.9 mg/dL (ref 8.9–10.3)
GFR calc Af Amer: 8 mL/min — ABNORMAL LOW (ref >60)
GFR calc non Af Amer: 7 mL/min — ABNORMAL LOW (ref >60)
Glucose, Bld: 256 mg/dL — ABNORMAL HIGH (ref 65–99)
POTASSIUM: 4 mmol/L (ref 3.5–5.1)
SODIUM: 132 mmol/L — AB (ref 135–145)

## 2017-03-02 LAB — MRSA PCR SCREENING: MRSA BY PCR: NEGATIVE

## 2017-03-02 LAB — HIV ANTIBODY (ROUTINE TESTING W REFLEX): HIV Screen 4th Generation wRfx: NONREACTIVE

## 2017-03-02 LAB — GLUCOSE, CAPILLARY
GLUCOSE-CAPILLARY: 254 mg/dL — AB (ref 65–99)
Glucose-Capillary: 213 mg/dL — ABNORMAL HIGH (ref 65–99)
Glucose-Capillary: 239 mg/dL — ABNORMAL HIGH (ref 65–99)
Glucose-Capillary: 285 mg/dL — ABNORMAL HIGH (ref 65–99)
Glucose-Capillary: 378 mg/dL — ABNORMAL HIGH (ref 65–99)

## 2017-03-02 MED ORDER — INSULIN ASPART 100 UNIT/ML ~~LOC~~ SOLN
0.0000 [IU] | Freq: Every day | SUBCUTANEOUS | Status: DC
  Administered 2017-03-02: 3 [IU] via SUBCUTANEOUS
  Administered 2017-03-03 – 2017-03-10 (×3): 2 [IU] via SUBCUTANEOUS
  Administered 2017-03-11: 3 [IU] via SUBCUTANEOUS

## 2017-03-02 MED ORDER — VANCOMYCIN HCL IN DEXTROSE 1-5 GM/200ML-% IV SOLN
INTRAVENOUS | Status: AC
Start: 2017-03-02 — End: 2017-03-02
  Administered 2017-03-02: 1000 mg via INTRAVENOUS
  Filled 2017-03-02: qty 200

## 2017-03-02 MED ORDER — ONDANSETRON HCL 4 MG/2ML IJ SOLN
4.0000 mg | Freq: Four times a day (QID) | INTRAMUSCULAR | Status: DC | PRN
  Administered 2017-03-02 – 2017-03-08 (×4): 4 mg via INTRAVENOUS
  Filled 2017-03-02 (×3): qty 2

## 2017-03-02 MED ORDER — SODIUM CHLORIDE 0.9 % IV SOLN
500.0000 mg | INTRAVENOUS | Status: DC
  Administered 2017-03-02: 500 mg via INTRAVENOUS
  Filled 2017-03-02 (×2): qty 500

## 2017-03-02 MED ORDER — CALCITRIOL 0.5 MCG PO CAPS
1.2500 ug | ORAL_CAPSULE | ORAL | Status: DC
  Administered 2017-03-05 – 2017-03-12 (×3): 1.25 ug via ORAL
  Filled 2017-03-02 (×4): qty 1

## 2017-03-02 MED ORDER — INSULIN ASPART 100 UNIT/ML ~~LOC~~ SOLN
0.0000 [IU] | Freq: Three times a day (TID) | SUBCUTANEOUS | Status: DC
  Administered 2017-03-02: 3 [IU] via SUBCUTANEOUS
  Administered 2017-03-03: 5 [IU] via SUBCUTANEOUS
  Administered 2017-03-03: 1 [IU] via SUBCUTANEOUS
  Administered 2017-03-03: 5 [IU] via SUBCUTANEOUS
  Administered 2017-03-04 – 2017-03-05 (×5): 2 [IU] via SUBCUTANEOUS
  Administered 2017-03-06: 3 [IU] via SUBCUTANEOUS
  Administered 2017-03-08: 1 [IU] via SUBCUTANEOUS
  Administered 2017-03-08 – 2017-03-09 (×2): 2 [IU] via SUBCUTANEOUS
  Administered 2017-03-09: 3 [IU] via SUBCUTANEOUS
  Administered 2017-03-10: 1 [IU] via SUBCUTANEOUS
  Administered 2017-03-10: 3 [IU] via SUBCUTANEOUS
  Administered 2017-03-10 – 2017-03-11 (×2): 2 [IU] via SUBCUTANEOUS
  Administered 2017-03-11 (×2): 3 [IU] via SUBCUTANEOUS
  Administered 2017-03-12 – 2017-03-13 (×3): 2 [IU] via SUBCUTANEOUS

## 2017-03-02 MED ORDER — ACETAMINOPHEN 325 MG PO TABS
650.0000 mg | ORAL_TABLET | Freq: Four times a day (QID) | ORAL | Status: DC | PRN
  Administered 2017-03-02 – 2017-03-11 (×16): 650 mg via ORAL
  Filled 2017-03-02 (×16): qty 2

## 2017-03-02 MED ORDER — SODIUM CHLORIDE 0.9 % IV SOLN
62.5000 mg | INTRAVENOUS | Status: DC
  Filled 2017-03-02: qty 5

## 2017-03-02 MED ORDER — METOCLOPRAMIDE HCL 5 MG PO TABS
5.0000 mg | ORAL_TABLET | Freq: Three times a day (TID) | ORAL | Status: DC | PRN
  Filled 2017-03-02: qty 1

## 2017-03-02 MED ORDER — DM-GUAIFENESIN ER 30-600 MG PO TB12
1.0000 | ORAL_TABLET | Freq: Two times a day (BID) | ORAL | Status: DC
  Administered 2017-03-02 – 2017-03-13 (×21): 1 via ORAL
  Filled 2017-03-02 (×22): qty 1

## 2017-03-02 MED ORDER — HYDROMORPHONE HCL 2 MG PO TABS
2.0000 mg | ORAL_TABLET | Freq: Four times a day (QID) | ORAL | Status: DC | PRN
  Administered 2017-03-02 – 2017-03-08 (×7): 2 mg via ORAL
  Filled 2017-03-02 (×7): qty 1

## 2017-03-02 MED ORDER — ONDANSETRON HCL 4 MG/2ML IJ SOLN
INTRAMUSCULAR | Status: AC
  Filled 2017-03-02: qty 2

## 2017-03-02 MED ORDER — METOCLOPRAMIDE HCL 5 MG PO TABS: 5.0000 mg | ORAL_TABLET | Freq: Three times a day (TID) | ORAL | Status: DC

## 2017-03-02 MED ORDER — CALCIUM ACETATE (PHOS BINDER) 667 MG PO CAPS
2001.0000 mg | ORAL_CAPSULE | Freq: Three times a day (TID) | ORAL | Status: DC
  Administered 2017-03-02 – 2017-03-13 (×25): 2001 mg via ORAL
  Filled 2017-03-02 (×25): qty 3

## 2017-03-02 MED ORDER — CALCIUM ACETATE (PHOS BINDER) 667 MG PO CAPS: 1334.0000 mg | ORAL_CAPSULE | Freq: Every day | ORAL | Status: DC | PRN

## 2017-03-02 NOTE — Consult Note (Signed)
[facility] Renal Consultation Note  Indication for Consultation:  Management of ESRD/hemodialysis; anemia, hypertension/volume and secondary hyperparathyroidism  HPI: Russell [name] is a 47 y.o. male with ESRD,05/20/16 volume over load - failed PD - cath out  ( hd mwfsat  ASH ) d/t  HTN & DM, Uncontrolled DM with neuropathy, HTN, [place] kidney, hypothyroidism, macular degeneration, Hx Erosive esophagitis, obesity, mixed obstructive lung disease, Hx prolonged QT syndrome NSTEMI/CAD CABG , large wt gains op kid center =(needs 4x/week hd  But claims not able to get Transportation 4th day ).      Admitted with CAP (seen on CTA) , noted yesterday was  being evaluated by outpt.  [facility]  Dr Marshell Garfinkel  =For Recent CT scan showing  right upper lobe consolidative mass,reported to decline op IV Fotaz 2/22  (at kid center) [place] stopped after became nauseous . At his ov ""developed emesis , elevated bp/ taycardia/ BS 366 "" sent to ER  To eval for DKA"/ In er co sob and CTA done , O2 sat dropped to 89%,  Glucose 256, K 4.0  hgb 11.3 .  We are consulted for HD/ ESRD needs, at op HD 02/26/17  EDW decreased by Dr Hollie Salk as he left below edw. Currently in room appears comfortable   Past Medical History:  Diagnosis Date  . Anxiety   . Asthma   . CIDP (chronic inflammatory demyelinating polyneuropathy) ([place]) 01/10/2017  . 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 Daily headache   . Depression   . DVT (deep venous thrombosis), H/o 01/2014-on Xarelto 03/24/2014   LLE  . ESRD (end stage renal disease) on dialysis [facility])    "[place], MWF" (06/29/2016)  . Gait abnormality 12/22/2016  . Gastroparesis 12/22/2016  . Hypertension   . Hypothyroidism   . Nephrotic syndrome 05/18/2014  . Neuropathy   . Type 2 diabetes mellitus with diabetic  nephropathy [facility])     Past Surgical History:  Procedure Laterality Date  . ANKLE FRACTURE SURGERY Right 1988  . AV FISTULA PLACEMENT Left 01/01/2015   Procedure: CREATION OF LEFT RADIAL CEPHALIC ARTERIOVENOUS (AV) FISTULA ;  Surgeon: Mal Misty, MD;  Location: [place];  Service: Vascular;  Laterality: Left;  . CAPD REMOVAL N/A 05/19/2016   Procedure: PD CATH REMOVAL;  Surgeon: Coralie Keens, MD;  Location: [place];  Service: General;  Laterality: N/A;  . CARDIAC CATHETERIZATION N/A 09/22/2015   Procedure: Left Heart Cath and Coronary Angiography;  Surgeon: Wellington Hampshire, MD;  Location: [place] [facility];  Service: Cardiovascular;  Laterality: N/A;  . CORONARY ARTERY BYPASS GRAFT N/A 09/28/2015   Procedure: CORONARY ARTERY BYPASS GRAFTING (CABG) x 3 UTILIZING LEFT MAMMARY ARTERY AND ENDOSCOPICALLY HARVESTED LEFT GREATER SAPHENOUS VEIN.;  Surgeon: [name], MD;  Location: [place];  Service: Open Heart Surgery;  Laterality: N/A;  . ENDOVEIN HARVEST OF GREATER SAPHENOUS VEIN Left 09/28/2015   Procedure: ENDOVEIN HARVEST OF GREATER SAPHENOUS VEIN;  Surgeon: [name], MD;  Location: [place];  Service: Open Heart Surgery;  Laterality: Left;  . ESOPHAGOGASTRODUODENOSCOPY N/A 03/29/2014   Procedure: ESOPHAGOGASTRODUODENOSCOPY (EGD);  Surgeon: Teena Irani, MD;  [facility];  Service: Endoscopy;  Laterality: N/A;  . EYE SURGERY    . FRACTURE SURGERY    . INSERTION OF DIALYSIS CATHETER Right 01/05/2015  Procedure: INSERTION OF RIGHT INTERNAL JUGULAR DIALYSIS CATHETER;  Surgeon: Conrad [place], MD;  Location: [place];  Service: Vascular;  Laterality: Right;  . LEFT HEART CATH AND CORS/GRAFTS ANGIOGRAPHY N/A 09/13/2016   Procedure: LEFT HEART CATH AND CORS/GRAFTS ANGIOGRAPHY;  Surgeon: Burnell Blanks, MD;  Location: [place] [facility];  Service: Cardiovascular;  Laterality: N/A;  . RETINAL LASER PROCEDURE Bilateral   . TEE WITHOUT CARDIOVERSION N/A 09/28/2015   Procedure:  TRANSESOPHAGEAL ECHOCARDIOGRAM (TEE);  Surgeon: [name], MD;  Location: [place];  Service: Open Heart Surgery;  Laterality: N/A;  . TONSILLECTOMY AND ADENOIDECTOMY  1970s      Family History  Problem Relation Age of Onset  . Obesity Mother        Patient states that family members have no other medical illnesses other than what I have described  . Kidney cancer Maternal Grandmother   . Cancer Father 2       AML  . Heart disease Unknown        No family history      reports that  has never smoked. he has never used smokeless tobacco. He reports that he drinks alcohol. He reports that he does not use drugs.   Allergies  Allergen Reactions  . Nsaids Other (See Comments)    Told to avoid all nsaids due to kidney disease   . Tape Other (See Comments)    Welts result, if left for a long amount of time    Prior to Admission medications   Medication Sig Start Date End Date Taking? Authorizing Provider  acetaminophen (TYLENOL) 500 MG tablet Take 1 tablet (500 mg total) by mouth every 6 (six) hours as needed for moderate pain. Patient taking differently: Take 1,000 mg by mouth every 6 (six) hours as needed (for pain).  09/29/14  Yes Florencia Reasons, MD  albuterol (PROVENTIL HFA;VENTOLIN HFA) 108 (90 Base) MCG/ACT inhaler Inhale 1 puff into the lungs every 6 (six) hours as needed for wheezing or shortness of breath. Patient taking differently: Inhale 2 puffs into the lungs every 8 (eight) hours as needed for wheezing or shortness of breath.  03/11/15  Yes [name], MD  albuterol (PROVENTIL) (2.5 MG/3ML) 0.083% nebulizer solution Take 3 mLs (2.5 mg total) by nebulization every 4 (four) hours as needed for wheezing. 09/20/16  Yes Shawnee Knapp, MD  aspirin EC 81 MG EC tablet Take 1 tablet (81 mg total) by mouth daily. 10/06/15  Yes Barrett, Erin R, PA-C  atorvastatin (LIPITOR) 80 MG tablet Take 1 tablet (80 mg total) by mouth daily at 6 PM. 01/05/17  Yes Almyra Deforest, PA  calcitRIOL (ROCALTROL)  0.25 MCG capsule Take 1 capsule (0.25 mcg total) by mouth every Monday, Wednesday, and Friday with hemodialysis. Patient taking differently: Take 0.25 mcg by mouth See admin instructions. Tuesday Thursday Saturday 10/06/15  Yes Barrett, Erin R, PA-C  calcium acetate (PHOSLO) 667 MG capsule Take 2 capsules (1,334 mg total) by mouth 3 (three) times daily with meals. Patient taking differently: Take 1,334-2,001 mg by mouth See admin instructions. Take 2001 mg by mouth 3 times daily with meals and take 1334 mg by mouth with snacks 01/08/15  Yes Lavina Hamman, MD  citalopram (CELEXA) 40 MG tablet Take 40 mg by mouth at bedtime.    Yes [provider]  clonazePAM (KLONOPIN) 2 MG tablet Take 2 mg by mouth as needed.    Yes [provider]  cyclobenzaprine (FLEXERIL) 10 MG tablet  Take 1 tablet (10 mg total) 3 times/day as needed-between meals & bedtime by mouth for muscle spasms. 11/07/16  Yes Shawnee Knapp, MD  gabapentin (NEURONTIN) 100 MG capsule Take 2 capsules (200 mg total) at bedtime by mouth. Patient taking differently: Take 200 mg by mouth 3 (three) times daily.  11/07/16  Yes Shawnee Knapp, MD  hydrALAZINE (APRESOLINE) 25 MG tablet Take 1 tablet (25 mg total) by mouth 3 (three) times daily as needed (take for blood pressure above 160 if not controlled with home meds). 10/15/16 10/15/17 Yes Lavina Hamman, MD  HYDROcodone-acetaminophen (NORCO/VICODIN) 5-325 MG tablet Take 1-2 tablets by mouth every 4 (four) hours as needed for moderate pain. 08/17/16  Yes Shawnee Knapp, MD  HYDROmorphone (DILAUDID) 2 MG tablet Take 2 mg by mouth 3 (three) times daily.   Yes [provider]  hydrOXYzine (ATARAX/VISTARIL) 25 MG tablet Take 25 mg by mouth 2 (two) times daily as needed for anxiety.    Yes [provider]  Immune Globulin, Human, (PRIVIGEN) 40 GM/400ML SOLN Inject 40 g into the vein. 40gm/dayx 5 days and then 40gm q4 weeks   Yes [provider]  insulin glargine (LANTUS)  100 UNIT/ML injection Inject 0.3 mLs (30 Units total) into the skin at bedtime. Pens 01/05/17  Yes Philemon Kingdom, MD  insulin lispro (HUMALOG) 100 UNIT/ML injection Inject 0.15-0.3 mLs (15-30 Units total) into the skin every evening. Take one unit per 5 carbs depending on dinner. 11/29/15  Yes Philemon Kingdom, MD  levothyroxine (SYNTHROID, LEVOTHROID) 50 MCG tablet Take 1 tablet (50 mcg total) by mouth daily before breakfast. 01/05/17  Yes Philemon Kingdom, MD  liraglutide (VICTOZA) 18 MG/3ML SOPN Inject 0.3 mLs (1.8 mg total) into the skin daily before breakfast. 01/05/17  Yes Philemon Kingdom, MD  metoCLOPramide (REGLAN) 5 MG tablet Take 1 tablet (5 mg total) by mouth every 8 (eight) hours as needed for nausea or vomiting. Patient taking differently: Take 10 mg by mouth 3 (three) times daily.  01/10/16  Yes Dong, Robin, DO  metoprolol tartrate (LOPRESSOR) 100 MG tablet Take 1 tab twice a day on dialysis days and once a day on other days. 01/05/17  Yes Almyra Deforest, PA  ondansetron (ZOFRAN) 4 MG tablet Take 1 tablet (4 mg total) by mouth 3 (three) times daily as needed for nausea or vomiting. 03/01/17  Yes Mannam, Praveen, MD  cinacalcet (SENSIPAR) 30 MG tablet Take 30 mg by mouth every Monday, Wednesday, and Friday.    [provider]  levofloxacin (LEVAQUIN) 500 MG tablet Take 1 daily every other day after dialysis 03/01/17   Marshell Garfinkel, MD     Anti-infectives (From admission, onward)   Start     Dose/Rate Route Frequency Ordered Stop   03/02/17 1700  ceFEPIme (MAXIPIME) 1 g in sodium chloride 0.9 % 100 mL IVPB     1 g 200 mL/hr over 30 Minutes Intravenous Every 24 hours 03/01/17 1948     03/02/17 1200  vancomycin (VANCOCIN) IVPB 1000 mg/200 mL premix     1,000 mg 200 mL/hr over 60 Minutes Intravenous Every M-W-F (Hemodialysis) 03/01/17 1948     03/01/17 1630  ceFEPIme (MAXIPIME) 2 g in sodium chloride 0.9 % 100 mL IVPB     2 g 200 mL/hr over 30 Minutes Intravenous  Once 03/01/17 1620  03/01/17 1752   03/01/17 1630  vancomycin (VANCOCIN) IVPB 1000 mg/200 mL premix  Status:  Discontinued     1,000 mg 200 mL/hr  over 60 Minutes Intravenous  Once 03/01/17 1620 03/01/17 1624   03/01/17 1630  vancomycin (VANCOCIN) 2,500 mg in sodium chloride 0.9 % 500 mL IVPB     2,500 mg 250 mL/hr over 120 Minutes Intravenous  Once 03/01/17 1624 03/01/17 1917      Results for orders placed or performed during the hospital encounter of 03/01/17 (from the past 48 hour(s))  Lipase, blood     Status: None   Collection Time: 03/01/17  1:12 PM  Result Value Ref Range   Lipase 45 11 - 51 U/L    Comment: Performed at [facility] [address] [place], [place] [address]  Comprehensive metabolic panel     Status: Abnormal   Collection Time: 03/01/17  1:12 PM  Result Value Ref Range   Sodium 131 (L) 135 - 145 mmol/L   Potassium 4.2 3.5 - 5.1 mmol/L   Chloride 88 (L) 101 - 111 mmol/L   CO2 25 22 - 32 mmol/L   Glucose, Bld 337 (H) 65 - 99 mg/dL   BUN 29 (H) 6 - 20 mg/dL   Creatinine, Ser 6.90 (H) 0.61 - 1.24 mg/dL   Calcium 9.3 8.9 - 10.3 mg/dL   Total Protein 7.9 6.5 - 8.1 g/dL   Albumin 3.7 3.5 - 5.0 g/dL   AST 21 15 - 41 U/L   ALT 14 (L) 17 - 63 U/L   Alkaline Phosphatase 82 38 - 126 U/L   Total Bilirubin 0.8 0.3 - 1.2 mg/dL   GFR calc non Af Amer 9 (L) >60 mL/min   GFR calc Af Amer 10 (L) >60 mL/min    Comment: (NOTE) The eGFR has been calculated using the CKD EPI equation. This calculation has not been validated in all clinical situations. eGFR's persistently <60 mL/min signify possible Chronic Kidney Disease.    Anion gap 18 (H) 5 - 15    Comment: Performed at [facility], [place] [address] [place], [place] [address]  CBC     Status: Abnormal   Collection Time: 03/01/17  1:12 PM  Result Value Ref Range   WBC 8.2 4.0 - 10.5 K/uL   RBC 4.35 4.22 - 5.81 MIL/uL   Hemoglobin 12.7 (L) 13.0 - 17.0 g/dL   HCT 38.5 (L) 39.0 - 52.0 %   MCV 88.5 78.0 - 100.0 fL   MCH 29.2  26.0 - 34.0 pg   MCHC 33.0 30.0 - 36.0 g/dL   RDW 14.9 11.5 - 15.5 %   Platelets 273 150 - 400 K/uL    Comment: Performed at [facility] [address], [place], [place] [address]  I-Stat Troponin, ED (not at [facility])     Status: None   Collection Time: 03/01/17  1:55 PM  Result Value Ref Range   Troponin i, poc 0.04 0.00 - 0.08 ng/mL   Comment 3            Comment: Due to the release kinetics of cTnI, a negative result within the first hours of the onset of symptoms does not rule out myocardial infarction with certainty. If myocardial infarction is still suspected, repeat the test at appropriate intervals.   Magnesium     Status: None   Collection Time: 03/01/17  3:02 PM  Result Value Ref Range   Magnesium 2.1 1.7 - 2.4 mg/dL    Comment: Performed at [facility], [place] [address] [place], [place] [address]  Phosphorus     Status:  Abnormal   Collection Time: 03/01/17  3:02 PM  Result Value Ref Range   Phosphorus 6.8 (H) 2.5 - 4.6 mg/dL    Comment: Performed at [facility] [address], [place], [place] [address]  Influenza panel by PCR (type A & B)     Status: None   Collection Time: 03/01/17  3:02 PM  Result Value Ref Range   Influenza A By PCR NEGATIVE NEGATIVE   Influenza B By PCR NEGATIVE NEGATIVE    Comment: (NOTE) The Xpert Xpress Flu assay is intended as an aid in the diagnosis of  influenza and should not be used as a sole basis for treatment.  This  assay is FDA approved for nasopharyngeal swab specimens only. Nasal  washings and aspirates are unacceptable for Xpert Xpress Flu testing. Performed at [facility], [place] [address] [place], [place] [address]   I-Stat venous blood gas, ED     Status: Abnormal   Collection Time: 03/01/17  3:15 PM  Result Value Ref Range   pH, Ven 7.440 (H) 7.250 - 7.430   pCO2, Ven 48.4 44.0 - 60.0 mmHg   pO2, Ven 24.0 (LL) 32.0 - 45.0 mmHg   Bicarbonate 32.9 (H) 20.0 - 28.0 mmol/L   TCO2 34 (H) 22 - 32  mmol/L   O2 Saturation 44.0 %   Acid-Base Excess 7.0 (H) 0.0 - 2.0 mmol/L   Patient temperature HIDE    Sample type VENOUS    Comment NOTIFIED PHYSICIAN   Beta-hydroxybutyric acid     Status: Abnormal   Collection Time: 03/01/17  3:39 PM  Result Value Ref Range   Beta-Hydroxybutyric Acid 1.08 (H) 0.05 - 0.27 mmol/L    Comment: Performed at [facility] [address] [place], [place] [address]  I-stat troponin, ED     Status: None   Collection Time: 03/01/17  4:13 PM  Result Value Ref Range   Troponin i, poc 0.04 0.00 - 0.08 ng/mL   Comment 3            Comment: Due to the release kinetics of cTnI, a negative result within the first hours of the onset of symptoms does not rule out myocardial infarction with certainty. If myocardial infarction is still suspected, repeat the test at appropriate intervals.   I-Stat CG4 Lactic Acid, ED     Status: None   Collection Time: 03/01/17  4:15 PM  Result Value Ref Range   Lactic Acid, Venous 1.75 0.5 - 1.9 mmol/L  CBG monitoring, ED     Status: Abnormal   Collection Time: 03/01/17  8:19 PM  Result Value Ref Range   Glucose-Capillary 270 (H) 65 - 99 mg/dL  CBC     Status: Abnormal   Collection Time: 03/01/17  8:49 PM  Result Value Ref Range   WBC 10.0 4.0 - 10.5 K/uL   RBC 4.31 4.22 - 5.81 MIL/uL   Hemoglobin 12.7 (L) 13.0 - 17.0 g/dL   HCT 38.1 (L) 39.0 - 52.0 %   MCV 88.4 78.0 - 100.0 fL   MCH 29.5 26.0 - 34.0 pg   MCHC 33.3 30.0 - 36.0 g/dL   RDW 15.0 11.5 - 15.5 %   Platelets 282 150 - 400 K/uL    Comment: Performed at [facility], [place] [address], [place],  [address]  Creatinine, serum     Status: Abnormal   Collection Time: 03/01/17  8:49 PM  Result Value Ref Range  Creatinine, Ser 7.15 (H) 0.61 - 1.24 mg/dL   GFR calc non Af Amer 8 (L) >60 mL/min   GFR calc Af Amer 10 (L) >60 mL/min    Comment: (NOTE) The eGFR has been calculated using the CKD EPI equation. This calculation has not been validated in  all clinical situations. eGFR's persistently <60 mL/min signify possible Chronic Kidney Disease. Performed at [facility], [place] [address], [place], [place] [address]   Magnesium     Status: None   Collection Time: 03/01/17  8:49 PM  Result Value Ref Range   Magnesium 2.0 1.7 - 2.4 mg/dL    Comment: Performed at [facility], [place] [address] [place], [place] [address]  Phosphorus     Status: Abnormal   Collection Time: 03/01/17  8:49 PM  Result Value Ref Range   Phosphorus 6.2 (H) 2.5 - 4.6 mg/dL    Comment: Performed at [facility] [address] [place], [place] [address]  Glucose, capillary     Status: Abnormal   Collection Time: 03/01/17 11:12 PM  Result Value Ref Range   Glucose-Capillary 348 (H) 65 - 99 mg/dL  MRSA PCR Screening     Status: None   Collection Time: 03/01/17 11:30 PM  Result Value Ref Range   MRSA by PCR NEGATIVE NEGATIVE    Comment:        The GeneXpert MRSA Assay (FDA approved for NASAL specimens only), is one component of a comprehensive MRSA colonization surveillance program. It is not intended to diagnose MRSA infection nor to guide or monitor treatment for MRSA infections. Performed at [facility], [place] [address], [place], [place] [address]   Glucose, capillary     Status: Abnormal   Collection Time: 03/02/17 12:43 AM  Result Value Ref Range   Glucose-Capillary 378 (H) 65 - 99 mg/dL  Basic metabolic panel     Status: Abnormal   Collection Time: 03/02/17  5:10 AM  Result Value Ref Range   Sodium 132 (L) 135 - 145 mmol/L   Potassium 4.0 3.5 - 5.1 mmol/L   Chloride 92 (L) 101 - 111 mmol/L   CO2 25 22 - 32 mmol/L   Glucose, Bld 256 (H) 65 - 99 mg/dL   BUN 37 (H) 6 - 20 mg/dL   Creatinine, Ser 7.88 (H) 0.61 - 1.24 mg/dL   Calcium 8.9 8.9 - 10.3 mg/dL   GFR calc non Af Amer 7 (L) >60 mL/min   GFR calc Af Amer 8 (L) >60 mL/min    Comment: (NOTE) The eGFR has been calculated using the CKD EPI equation. This  calculation has not been validated in all clinical situations. eGFR's persistently <60 mL/min signify possible Chronic Kidney Disease.    Anion gap 15 5 - 15    Comment: Performed at [facility] [address], [place], [place] [address]  CBC WITH DIFFERENTIAL     Status: Abnormal   Collection Time: 03/02/17  5:10 AM  Result Value Ref Range   WBC 7.3 4.0 - 10.5 K/uL   RBC 3.86 (L) 4.22 - 5.81 MIL/uL   Hemoglobin 11.3 (L) 13.0 - 17.0 g/dL   HCT 34.9 (L) 39.0 - 52.0 %   MCV 90.4 78.0 - 100.0 fL   MCH 29.3 26.0 - 34.0 pg   MCHC 32.4 30.0 - 36.0 g/dL   RDW 15.7 (H) 11.5 - 15.5 %   Platelets 263 150 - 400 K/uL   Neutrophils  Relative % 75 %   Neutro Abs 5.4 1.7 - 7.7 K/uL   Lymphocytes Relative 16 %   Lymphs Abs 1.2 0.7 - 4.0 K/uL   Monocytes Relative 8 %   Monocytes Absolute 0.6 0.1 - 1.0 K/uL   Eosinophils Relative 1 %   Eosinophils Absolute 0.1 0.0 - 0.7 K/uL   Basophils Relative 0 %   Basophils Absolute 0.0 0.0 - 0.1 K/uL    Comment: Performed at [facility] [address], [place], [place] [address]  Glucose, capillary     Status: Abnormal   Collection Time: 03/02/17  7:42 AM  Result Value Ref Range   Glucose-Capillary 239 (H) 65 - 99 mg/dL  .  ROS: see hpi   Physical Exam: Vitals:   03/02/17 0835 03/02/17 0914  BP:  (!) 176/98  Pulse:  87  Resp:  18  Temp:  97.8 F (36.6 C)  SpO2: 97% 98%     General: alert, Obese wm , NAD  WD WN HEENT: [place] , Non icteric EOMI Neck: Supple Heart: RRR, soft 1/6 sem ,no r or g Lungs: CTA , unlabored breathing  Abdomen: obese, soft, nt, nd Extremities: trace bipedal edema  Skin:  No overt rash, L lat foot dry ulcer  Neuro: OX3, no overt acute deficits  Dialysis Access: pos bruit LFA AVF   Dialysis Orders: Center: [facility]   on MWF  . EDW 112(last hd decr)  HD Bath 2k, 2.25ca  Time 4 hr 52mn Heparin 8000 bolus 2000 interm. Access L FA AVF  Calcitriol 1.25 mcg po/HD    Mircera 50 q 2wks (last on 02/28/17)    Venofer   566mq wkly    Assessment/Plan 1. PNA - iv antib per admit  2. ESRD -  HD MWF  Hd today  3. Hypertension/volume  - Hd uf today ,bp meds , Follow up bp range with hd  4. Anemia  - hgb 11.3  No esa needed currently  follow up hgb  5. Metabolic bone disease -  Po vit d , binders , (no parsbiv in hosp ) 6. Nutrition - Carb /mod renal diet  7. DM type 2 - per admit  8. Depression / Anxiety = on meds  9. Gastroparesis - TID reglan , per admit  DaErnest HaberPA-C [id number]/01/2017, 11:01 AM

## 2017-03-02 NOTE — Care Management Note (Signed)
Case Management Note  Patient Details  Name: Russell [name] MRN: [id number] Date of Birth: 1970-06-23  Subjective/Objective:    History of  ESRD/hemodialysis admitted for Community acquired pneumonia.  PCP Norberto SorensonEva Shaw.            Action/Plan: In to speak with patient in HD. Discussed Observation status with patient.    Expected Discharge Date:                  Expected Discharge Plan:  Home/Self Care  Discharge planning Services  CM Consult  Status of Service:  In process, will continue to follow  Yancey FlemingsKimberly R Becton, RN  Nurse case manager [place] 03/02/2017, 3:45 PM

## 2017-03-02 NOTE — Procedures (Signed)
Patient seen on Hemodialysis. QB 400, UF goal 2.5L Treatment adjusted as needed.  Zetta BillsJay Rohn Fritsch MD [facility]. Office # [phone] Pager # [phone] 4:51 PM

## 2017-03-02 NOTE — Care Management CC44 (Signed)
Condition Code 44 Documentation Completed  Patient Details  Name: Russell [name] MRN: [id number] Date of Birth: 1970/04/25   Condition Code 44 given:  Yes Patient signature on Condition Code 44 notice:  Yes Documentation of 2 MD's agreement:    Code 44 added to claim:  Yes    Yancey FlemingsKimberly R Becton, RN 03/02/2017, 4:24 PM

## 2017-03-02 NOTE — Progress Notes (Signed)
Inpatient Diabetes Program Recommendations  AACE/ADA: New Consensus Statement on Inpatient Glycemic Control (2015)  Target Ranges:  Prepandial:   less than 140 mg/dL      Peak postprandial:   less than 180 mg/dL (1-2 hours)      Critically ill patients:  140 - 180 mg/dL   Lab Results  Component Value Date   GLUCAP 239 (H) 03/02/2017   HGBA1C 11.9 01/05/2017    Review of Glycemic Control  Diabetes history: DM 2, insulin dependent Outpatient Diabetes medications: Lantus 30 units, Humalong 15-30 units tid and 1 units for every 5 grams of CHO, Victoza 0.3 mg Daily Current orders for Inpatient glycemic control: Lantus 20 units  Inpatient Diabetes Program Recommendations:    Please order Novolog Sensitive Correction 0-9 units tid + HS scale 0-5 units. Patient may also need meal coverage when appetite improves. Glucose 239 mg/dl after Lantus 20 units. May also need titration of Lantus closer to home dose if glucose trends are still elevated.  A1c 11.9% on 01/05/2017, patient saw Dr. Elvera LennoxGherghe, Endocrinologist at that time with medication adjustments.  Could consider another A1c level while here to assess glucose control since Endocrinology visit till now.  Thanks,  Christena DeemShannon Taylia Berber RN, MSN, BC-ADM, [facility] Inpatient Diabetes Coordinator Team Pager [phone] (8a-5p)

## 2017-03-02 NOTE — Progress Notes (Signed)
Pt [id number]-[id number]. MD made aware. No new orders at this time. Will continue to monitor.

## 2017-03-02 NOTE — Progress Notes (Signed)
Pt with complaints of nausea this morning. Page to Dr. Sunnie Nielsenegalado for notification. Will continue to monitor. Dondra SpryMoore, Durga Saldarriaga Islee, RN

## 2017-03-02 NOTE — [place] Notes (Signed)
PROGRESS NOTE    Russell [name]  WUJ:[id number] DOB: 07-23-1970 DOA: 03/01/2017 PCP: [name], MD   Brief Narrative: Russell [name] is a 47 y.o. male  With pmhx significant for anxiety, asthma, CAD, DVT, depression, gastroparesis, DM II who presents with sob. Pt with sob and ill feeling for greater than 2 weeks. No fever or cough. Had been seen by his MD. [name] for f/u today. Told to start levaquin. Got worse and went to ED for eval.   ED Course: Started on Vanc and cefepime. Blood cultures missed. CTA showed pna. Hospitalists consulted for admit.  Assessment & Plan:   Active Problems:   CAP (community acquired pneumonia)  1-PNA; lung mass;  CT with persistent PNA since 7 days ago.  Continue with Vancomycin and cefepime.  Strept pneumonia ordered.  Schedule Nebulizer.  He will need repeat CT chest to document resolution of infiltrates.  HIV negative.  Of note patient was evaluated by Dr Isaiah Serge, pulmonologist 2-28 for lung mass, patient received one dose of vancomycin and Fortaz during HD but patient refuse IV antibiotics. Dr Isaiah Serge prescribe him Levaquin for 14 days. Patient came to ED complaining of SOB.  Patient will need PET scan in 4-5 weeks.  I will add Azithromycin to cover for atypical.   Chest pain, Dyspnea; suspect related to PNA.  CT angio negative for pe.  Troponin times 2 negative.  Check ECHO.   History of CABG;  Continue with aspirin, Lipitor, metoprolol./   HTN; Continue with Hydralazine, metoprolol.   DM type II;  Continue with lantus and SSI.   HLD; statins.   Depression/Anxiety Cont celexa No SI/HI Prn klonopin & atarax  Hypothyroidism; continue with synthroid.   CIDP, also DM neuropathy; follows with Dr Anne Hahn. He gets IVIG.    Gastroparesis; reglan change to TID PRN.  Nausea, after coughing. PRN phenergan.   Chronic pain; change dilaudid to PRN.     DVT prophylaxis: Heparin.  Code Status: full code.  Family Communication: care  discussed with patient.  Disposition Plan: home when stable.   Consultants:   Nephrology    Procedures: ECHO pending.   Antimicrobials: cefepime 2-28 Vancomycin 2-28  Subjective: Patient is not feeling well, he is complaining of chest pain, pleuritics. Chest pain after he cough or lying down. Also report SOB.    Objective: Vitals:   03/02/17 0500 03/02/17 0506 03/02/17 0835 03/02/17 0914  BP:  (!) 160/99  (!) 176/98  Pulse:  82  87  Resp:  18  18  Temp:  98.9 F (37.2 C)  97.8 F (36.6 C)  TempSrc:  Oral  Oral  SpO2:  97% 97% 98%  Weight: 113.5 kg (250 lb 3.6 oz)     Height:        Intake/Output Summary (Last 24 hours) at 03/02/2017 1134 Last data filed at 03/02/2017 0601 Gross per 24 hour  Intake 1100 ml  Output 0 ml  Net 1100 ml   Filed Weights   03/01/17 1310 03/02/17 0500  Weight: 113.4 kg (250 lb) 113.5 kg (250 lb 3.6 oz)    Examination:  General exam: No acute distress.  Respiratory system: decreased breath sound, no wheezing.  Cardiovascular system: S 1, S 2 RRR Gastrointestinal system: BS present, soft, nt Central nervous system: non focal.  Extremities: Symmetric power.  Skin; no rash     Data Reviewed: I have personally reviewed following labs and imaging studies  CBC: Recent Labs  Lab 03/01/17 1312 03/01/17 2049  03/02/17 0510  WBC 8.2 10.0 7.3  NEUTROABS  --   --  5.4  HGB 12.7* 12.7* 11.3*  HCT 38.5* 38.1* 34.9*  MCV 88.5 88.4 90.4  PLT 273 282 263   Basic Metabolic Panel: Recent Labs  Lab 03/01/17 1312 03/01/17 1502 03/01/17 2049 03/02/17 0510  NA 131*  --   --  132*  K 4.2  --   --  4.0  CL 88*  --   --  92*  CO2 25  --   --  25  GLUCOSE 337*  --   --  256*  BUN 29*  --   --  37*  CREATININE 6.90*  --  7.15* 7.88*  CALCIUM 9.3  --   --  8.9  MG  --  2.1 2.0  --   PHOS  --  6.8* 6.2*  --    GFR: Estimated Creatinine Clearance: 15.7 mL/min (A) (by C-G formula based on SCr of 7.88 mg/dL (H)). Liver Function  Tests: Recent Labs  Lab 03/01/17 1312  AST 21  ALT 14*  ALKPHOS 82  BILITOT 0.8  PROT 7.9  ALBUMIN 3.7   Recent Labs  Lab 03/01/17 1312  LIPASE 45   No results for input(s): AMMONIA in the last 168 hours. Coagulation Profile: No results for input(s): INR, PROTIME in the last 168 hours. Cardiac Enzymes: No results for input(s): CKTOTAL, CKMB, CKMBINDEX, TROPONINI in the last 168 hours. BNP (last 3 results) No results for input(s): PROBNP in the last 8760 hours. HbA1C: No results for input(s): HGBA1C in the last 72 hours. CBG: Recent Labs  Lab 03/01/17 2019 03/01/17 2312 03/02/17 0043 03/02/17 0742  GLUCAP 270* 348* 378* 239*   Lipid Profile: No results for input(s): CHOL, HDL, LDLCALC, TRIG, CHOLHDL, LDLDIRECT in the last 72 hours. Thyroid Function Tests: No results for input(s): TSH, T4TOTAL, FREET4, T3FREE, THYROIDAB in the last 72 hours. Anemia Panel: No results for input(s): VITAMINB12, FOLATE, FERRITIN, TIBC, IRON, RETICCTPCT in the last 72 hours. Sepsis Labs: Recent Labs  Lab 03/01/17 1615  LATICACIDVEN 1.75    Recent Results (from the past 240 hour(s))  MRSA PCR Screening     Status: None   Collection Time: 03/01/17 11:30 PM  Result Value Ref Range Status   MRSA by PCR NEGATIVE NEGATIVE Final    Comment:        The GeneXpert MRSA Assay (FDA approved for NASAL specimens only), is one component of a comprehensive MRSA colonization surveillance program. It is not intended to diagnose MRSA infection nor to guide or monitor treatment for MRSA infections. Performed at [facility], [address] [place], [place] [address]          Radiology Studies: Dg Chest 2 View  Result Date: 03/01/2017 CLINICAL DATA:  Cough EXAM: CHEST  2 VIEW COMPARISON:  10/13/2016 chest radiograph. FINDINGS: Intact sternotomy wires. Stable cardiomediastinal silhouette with borderline mild cardiomegaly. No pneumothorax. No pleural effusion. Slight prominence of  the interstitial markings. No acute consolidative airspace disease. IMPRESSION: Borderline mild cardiomegaly and slight prominence of the interstitial markings. Mild congestive heart failure is not excluded. Electronically Signed   By: Delbert PhenixJason A Poff M.D.   On: 03/01/2017 16:02   Ct Angio Chest Pe W And/or Wo Contrast  Result Date: 03/01/2017 CLINICAL DATA:  Nausea and hypertension this morning. High pretest probability for pulmonary embolism. History of nephrotic syndrome, on dialysis. EXAM: CT ANGIOGRAPHY CHEST WITH CONTRAST TECHNIQUE: Multidetector CT imaging of the chest was  performed using the standard protocol during bolus administration of intravenous contrast. Multiplanar CT image reconstructions and MIPs were obtained to evaluate the vascular anatomy. CONTRAST:  ISOVUE-370 IOPAMIDOL (ISOVUE-370) INJECTION 76% COMPARISON:  02/22/2017 FINDINGS: Cardiovascular: Satisfactory opacification of the pulmonary arteries to the segmental level. No evidence of pulmonary embolism. Cardiomegaly with hypertrophic appearance of the left ventricle. Atherosclerotic calcification, status post CABG. Detection of graft patency is limited due to early aortic opacification. Mediastinum/Nodes: Borderline right paratracheal lymph node size that is stable at up to 12 mm in short axis. Lungs/Pleura: Focal airspace opacity in the right middle lobe persists but appears slightly decreased in bulk and there is slightly decreased regional airspace opacity. Pneumonia is favored over a mass. Upper Abdomen: Negative Musculoskeletal: No acute or aggressive finding. Review of the MIP images confirms the above findings. IMPRESSION: 1. Negative for pulmonary embolism. 2. Focal opacity in the right middle lobe as described on chest CT 7 days prior. There has been subtle decrease in volume favoring improving pneumonia over a neoplasm. This finding is occult radiographically; recommend chest CT follow-up in 6-12 weeks. 3. Cardiomegaly and  atherosclerosis. Electronically Signed   By: Marnee Spring M.D.   On: 03/01/2017 15:58        Scheduled Meds: . aspirin EC  81 mg Oral Daily  . atorvastatin  80 mg Oral q1800  . calcitRIOL  1.25 mcg Oral Q M,W,F-HD  . calcium acetate  2,001 mg Oral TID WC  . cinacalcet  30 mg Oral Q M,W,F  . citalopram  40 mg Oral QHS  . dextromethorphan-guaiFENesin  1 tablet Oral BID  . gabapentin  200 mg Oral TID  . heparin  5,000 Units Subcutaneous Q8H  . insulin aspart  0-5 Units Subcutaneous QHS  . insulin aspart  0-9 Units Subcutaneous TID WC  . insulin glargine  20 Units Subcutaneous QHS  . ipratropium-albuterol  3 mL Nebulization BID  . levothyroxine  50 mcg Oral QAC breakfast  . metoprolol tartrate  100 mg Oral BID   Continuous Infusions: . ceFEPime (MAXIPIME) IV    . [START ON 03/07/2017] ferric gluconate (FERRLECIT/NULECIT) IV    . vancomycin       LOS: 0 days    Time spent:35 minutes     Alba Cory, MD [facility] Pager [phone]  If 7PM-7AM, please contact night-coverage [email or web address] Password [facility] 03/02/2017, 11:34 AM

## 2017-03-02 NOTE — [place] Notes (Signed)
New Admission Note:  Arrival Method: Via stretcher from ED.  Mental Orientation: Alert and Oriented x4 Telemetry: CCMD verified.  Assessment: Completed Skin: Refer to flowsheet.  IV: Right AC.  Pain: 8/10 Safety Measures: Safety Fall Prevention Plan discussed with patient. Admission: Completed 5 [place] Orientation: Patient has been orientated to the room, unit and the staff.  Orders have been reviewed and implemented. Will continue to monitor the patient. Call light has been placed within reach.   Aram CandelaJequetta Axel Meas, RN  Phone Number: [phone]

## 2017-03-02 NOTE — Care Management Obs Status (Signed)
MEDICARE OBSERVATION STATUS NOTIFICATION   Patient Details  Name: Russell BirkenheadSteven B Husmann MRN: [id number] Date of Birth: 04-04-1970   Medicare Observation Status Notification Given:  Yes    Yancey FlemingsKimberly R Becton, RN 03/02/2017, 4:24 PM

## 2017-03-02 NOTE — Consult Note (Signed)
WOC Nurse wound consult note Reason for Consult: Left foot diabetic ulcer Wound type: Abrasion to left lower leg, dorsum of upper foot caused by a shoe rubbing the area per patient report Measurement: 3 cm x 3.5 cm Wound bed: 100% dry brown eschar Drainage (amount, consistency, odor) no drainage, erythema, odor or induration Periwound: Normal color and texture Dressing procedure/placement/frequency: Apply betadine, allow to air dry, wrap in kerlex.  Perform each shift. Discussed POC with patient and bedside nurse.  Re consult if needed, will not follow at this time. Thank you,  Helmut MusterSherry Nefertari Rebman MSN,RN,CWOCN,CNS-BC, [phone])

## 2017-03-03 ENCOUNTER — Other Ambulatory Visit (HOSPITAL_COMMUNITY): Payer: Medicare Other

## 2017-03-03 LAB — BASIC METABOLIC PANEL
ANION GAP: 15 (ref 5–15)
BUN: 29 mg/dL — ABNORMAL HIGH (ref 6–20)
CALCIUM: 9.4 mg/dL (ref 8.9–10.3)
CHLORIDE: 91 mmol/L — AB (ref 101–111)
CO2: 26 mmol/L (ref 22–32)
Creatinine, Ser: 6.09 mg/dL — ABNORMAL HIGH (ref 0.61–1.24)
GFR calc Af Amer: 12 mL/min — ABNORMAL LOW (ref >60)
GFR calc non Af Amer: 10 mL/min — ABNORMAL LOW (ref >60)
GLUCOSE: 232 mg/dL — AB (ref 65–99)
Potassium: 3.9 mmol/L (ref 3.5–5.1)
Sodium: 132 mmol/L — ABNORMAL LOW (ref 135–145)

## 2017-03-03 LAB — CBC
HCT: 37.8 % — ABNORMAL LOW (ref 39.0–52.0)
HEMOGLOBIN: 12.3 g/dL — AB (ref 13.0–17.0)
MCH: 29.7 pg (ref 26.0–34.0)
MCHC: 32.5 g/dL (ref 30.0–36.0)
MCV: 91.3 fL (ref 78.0–100.0)
Platelets: 145 10*3/uL — ABNORMAL LOW (ref 150–400)
RBC: 4.14 MIL/uL — ABNORMAL LOW (ref 4.22–5.81)
RDW: 15.8 % — ABNORMAL HIGH (ref 11.5–15.5)
WBC: 6.7 10*3/uL (ref 4.0–10.5)

## 2017-03-03 LAB — GLUCOSE, CAPILLARY
GLUCOSE-CAPILLARY: 294 mg/dL — AB (ref 65–99)
GLUCOSE-CAPILLARY: 207 mg/dL — AB (ref 65–99)
Glucose-Capillary: 142 mg/dL — ABNORMAL HIGH (ref 65–99)
Glucose-Capillary: 252 mg/dL — ABNORMAL HIGH (ref 65–99)

## 2017-03-03 MED ORDER — PANTOPRAZOLE SODIUM 40 MG IV SOLR
40.0000 mg | Freq: Two times a day (BID) | INTRAVENOUS | Status: DC
  Administered 2017-03-03 – 2017-03-04 (×2): 40 mg via INTRAVENOUS
  Filled 2017-03-03 (×2): qty 40

## 2017-03-03 MED ORDER — IPRATROPIUM-ALBUTEROL 0.5-2.5 (3) MG/3ML IN SOLN: 3.0000 mL | RESPIRATORY_TRACT | Status: DC | PRN

## 2017-03-03 MED ORDER — DOXYCYCLINE HYCLATE 100 MG PO TABS
100.0000 mg | ORAL_TABLET | Freq: Two times a day (BID) | ORAL | Status: AC
  Administered 2017-03-03 – 2017-03-09 (×13): 100 mg via ORAL
  Filled 2017-03-03 (×14): qty 1

## 2017-03-03 NOTE — [place] Notes (Signed)
[facility] [place] Note   Subjective:  Sitting up in bed, eating breakfast. Feeling better this morning, wondering how long he will be here. Tolerated HD without issues yesterday.   Objective Vitals:   03/02/17 1728 03/02/17 2004 03/02/17 2214 03/03/17 0457  BP: 130/75  137/64 137/85  Pulse: 93  82 74  Resp: 18  19 20   Temp: 98.1 F (36.7 C)  98.8 F (37.1 C) 98.8 F (37.1 C)  TempSrc: Oral  Oral Oral  SpO2: 97% 99% 100% 98%  Weight:   115.2 kg (253 lb 15.5 oz)   Height:       Physical Exam General: Obese male, NAD Heart: RRR  Lungs: CTAB Abdomen: soft NT Extremities: Trace LE edema  Dialysis Access: LUE AVF +bruit   Dialysis Orders:  Dialysis Orders: Center: [facility]   on MWF  . EDW 112(last hd decr)  HD Bath 2k, 2.25ca  Time 4 hr 15min Heparin 8000 bolus 2000 interm. Access L FA AVF  Calcitriol 1.25 mcg po/HD    Mircera 50 q 2wks (last on 02/28/17)    Venofer  50mg  q wkly    Assessment/Plan 1. PNA - IV Vanc/cefepime per primary  2. ESRD -  HD MWF. Cont on schedule. Next HD 3/4  3. Hypertension/volume  - BP improved with UF/net UF 3L  4. Anemia  - hgb 11.3  No esa needed currently  Follow trend  5. Metabolic bone disease -  Po vit d , binders , (no parsbiv in hosp ) 6. Nutrition - Carb /mod renal diet  7. DM type 2 - per admit  8. Depression / Anxiety - On citalopram  9. Gastroparesis - TID reglan , per admit   Tomasa Blasegechi Grace Jasneet Schobert PA-C [facility] Pager [phone] 03/03/2017,9:44 AM  LOS: 1 day   Additional Objective Labs: Basic Metabolic Panel: Recent Labs  Lab 03/01/17 1312 03/01/17 1502 03/01/17 2049 03/02/17 0510  NA 131*  --   --  132*  K 4.2  --   --  4.0  CL 88*  --   --  92*  CO2 25  --   --  25  GLUCOSE 337*  --   --  256*  BUN 29*  --   --  37*  CREATININE 6.90*  --  7.15* 7.88*  CALCIUM 9.3  --   --  8.9  PHOS  --  6.8* 6.2*  --    CBC: Recent Labs  Lab 03/01/17 1312 03/01/17 2049 03/02/17 0510  WBC  8.2 10.0 7.3  NEUTROABS  --   --  5.4  HGB 12.7* 12.7* 11.3*  HCT 38.5* 38.1* 34.9*  MCV 88.5 88.4 90.4  PLT 273 282 263   Blood Culture    Component Value Date/Time   SDES BLOOD RIGHT HAND 05/16/2016 2032   SPECREQUEST  05/16/2016 2032    BOTTLES DRAWN AEROBIC AND ANAEROBIC Blood Culture adequate volume   CULT NO GROWTH 5 DAYS 05/16/2016 2032   REPTSTATUS 05/21/2016 FINAL 05/16/2016 2032    Cardiac Enzymes: No results for input(s): CKTOTAL, CKMB, CKMBINDEX, TROPONINI in the last 168 hours. CBG: Recent Labs  Lab 03/02/17 0742 03/02/17 1143 03/02/17 1725 03/02/17 2054 03/03/17 0754  GLUCAP 239* 285* 213* 254* 142*   Iron Studies: No results for input(s): IRON, TIBC, TRANSFERRIN, FERRITIN in the last 72 hours. Lab Results  Component Value Date   INR 1.0 09/12/2016   INR 1.25 05/16/2016   INR 1.12 04/27/2016   Medications: .  azithromycin Stopped (03/02/17 2012)  . ceFEPime (MAXIPIME) IV Stopped (03/02/17 1845)  . [START ON 03/07/2017] ferric gluconate (FERRLECIT/NULECIT) IV    . vancomycin Stopped (03/02/17 1700)   . aspirin EC  81 mg Oral Daily  . atorvastatin  80 mg Oral q1800  . calcitRIOL  1.25 mcg Oral Q M,W,F-HD  . calcium acetate  2,001 mg Oral TID WC  . cinacalcet  30 mg Oral Q M,W,F  . citalopram  40 mg Oral QHS  . dextromethorphan-guaiFENesin  1 tablet Oral BID  . gabapentin  200 mg Oral TID  . heparin  5,000 Units Subcutaneous Q8H  . insulin aspart  0-5 Units Subcutaneous QHS  . insulin aspart  0-9 Units Subcutaneous TID WC  . insulin glargine  20 Units Subcutaneous QHS  . ipratropium-albuterol  3 mL Nebulization BID  . levothyroxine  50 mcg Oral QAC breakfast  . metoprolol tartrate  100 mg Oral BID

## 2017-03-03 NOTE — [place] Notes (Signed)
PROGRESS NOTE    Russell [name]  ZOX:[id number] DOB: 10-Jul-1970 DOA: 03/01/2017 PCP: [name], MD   Brief Narrative: TENZIN EDELMAN is a 47 y.o. male  With pmhx significant for anxiety, asthma, CAD, DVT, depression, gastroparesis, DM II who presents with sob. Pt with sob and ill feeling for greater than 2 weeks. No fever or cough. Had been seen by his MD. [name] for f/u today. Told to start levaquin. Got worse and went to ED for eval.   ED Course: Started on Vanc and cefepime. Blood cultures missed. CTA showed pna. Hospitalists consulted for admit.  Assessment & Plan:   Active Problems:   CAP (community acquired pneumonia)  1-PNA; lung mass;  CT with persistent PNA since 7 days ago.  Continue with cefepime.  Strept pneumonia ordered.  Schedule Nebulizer.  HIV negative.  Of note patient was evaluated by Dr Isaiah Serge, pulmonologist 2-28 for lung mass, patient received one dose of vancomycin and Fortaz during HD but patient refuse IV antibiotics. Dr Isaiah Serge prescribe him Levaquin for 14 days. Patient came to ED complaining of SOB.  Patient will need PET scan in 4-5 weeks.  Doxy to cover atypical.  Stop vancomycin, MRSA PCR negative.  Speech evaluation. He report cough while eating.   Chest pain, Dyspnea; suspect related to PNA.  CT angio negative for pe.  Troponin times 2 negative.  ECHO pending.   History of CABG;  Continue with aspirin, Lipitor, metoprolol./   HTN; Continue with Hydralazine, metoprolol.   DM type II;  Continue with lantus and SSI.   HLD; statins.   Depression/Anxiety Cont celexa No SI/HI Prn klonopin & atarax  Hypothyroidism; continue with synthroid.   CIDP, also DM neuropathy; follows with Dr Anne Hahn. He gets IVIG.  Will order PT , OT  Gastroparesis; reglan change to TID PRN.  Nausea, after coughing. PRN phenergan.   Chronic pain; change dilaudid to PRN.   Prolong QT; repeat EKG in am. Check Mg.  Change azithro to doxy    DVT prophylaxis:  Heparin.  Code Status: full code.  Family Communication: care discussed with patient.  Disposition Plan: home when stable.   Consultants:   Nephrology    Procedures: ECHO pending.   Antimicrobials: cefepime 2-28 Vancomycin 2-28  Subjective: He is feeling better today, but does not feel ready for discharge. He report cough while eating.   Objective: Vitals:   03/02/17 2004 03/02/17 2214 03/03/17 0457 03/03/17 1011  BP:  137/64 137/85 126/68  Pulse:  82 74 82  Resp:  19 20 20   Temp:  98.8 F (37.1 C) 98.8 F (37.1 C) 97.6 F (36.4 C)  TempSrc:  Oral Oral Oral  SpO2: 99% 100% 98% 100%  Weight:  115.2 kg (253 lb 15.5 oz)    Height:        Intake/Output Summary (Last 24 hours) at 03/03/2017 1418 Last data filed at 03/03/2017 0900 Gross per 24 hour  Intake 1690 ml  Output 3007 ml  Net -1317 ml   Filed Weights   03/02/17 1213 03/02/17 1639 03/02/17 2214  Weight: 114.3 kg (251 lb 15.8 oz) 111.3 kg (245 lb 6 oz) 115.2 kg (253 lb 15.5 oz)    Examination:  General exam: NAD Respiratory system: Normal respiratory effort, few crackles.  Cardiovascular system: S 1,. S 2 RRR Gastrointestinal system: BS present, soft, nt Central nervous system: Non focal.  Extremities: upper and lower  extremities with bilateral weakness.  Skin; No rash.  Data Reviewed: I have personally reviewed following labs and imaging studies  CBC: Recent Labs  Lab 03/01/17 1312 03/01/17 2049 03/02/17 0510 03/03/17 1041  WBC 8.2 10.0 7.3 6.7  NEUTROABS  --   --  5.4  --   HGB 12.7* 12.7* 11.3* 12.3*  HCT 38.5* 38.1* 34.9* 37.8*  MCV 88.5 88.4 90.4 91.3  PLT 273 282 263 145*   Basic Metabolic Panel: Recent Labs  Lab 03/01/17 1312 03/01/17 1502 03/01/17 2049 03/02/17 0510 03/03/17 1041  NA 131*  --   --  132* 132*  K 4.2  --   --  4.0 3.9  CL 88*  --   --  92* 91*  CO2 25  --   --  25 26  GLUCOSE 337*  --   --  256* 232*  BUN 29*  --   --  37* 29*  CREATININE 6.90*  --  7.15*  7.88* 6.09*  CALCIUM 9.3  --   --  8.9 9.4  MG  --  2.1 2.0  --   --   PHOS  --  6.8* 6.2*  --   --    GFR: Estimated Creatinine Clearance: 20.5 mL/min (A) (by C-G formula based on SCr of 6.09 mg/dL (H)). Liver Function Tests: Recent Labs  Lab 03/01/17 1312  AST 21  ALT 14*  ALKPHOS 82  BILITOT 0.8  PROT 7.9  ALBUMIN 3.7   Recent Labs  Lab 03/01/17 1312  LIPASE 45   No results for input(s): AMMONIA in the last 168 hours. Coagulation Profile: No results for input(s): INR, PROTIME in the last 168 hours. Cardiac Enzymes: No results for input(s): CKTOTAL, CKMB, CKMBINDEX, TROPONINI in the last 168 hours. BNP (last 3 results) No results for input(s): PROBNP in the last 8760 hours. HbA1C: No results for input(s): HGBA1C in the last 72 hours. CBG: Recent Labs  Lab 03/02/17 1143 03/02/17 1725 03/02/17 2054 03/03/17 0754 03/03/17 1154  GLUCAP 285* 213* 254* 142* 252*   Lipid Profile: No results for input(s): CHOL, HDL, LDLCALC, TRIG, CHOLHDL, LDLDIRECT in the last 72 hours. Thyroid Function Tests: No results for input(s): TSH, T4TOTAL, FREET4, T3FREE, THYROIDAB in the last 72 hours. Anemia Panel: No results for input(s): VITAMINB12, FOLATE, FERRITIN, TIBC, IRON, RETICCTPCT in the last 72 hours. Sepsis Labs: Recent Labs  Lab 03/01/17 1615  LATICACIDVEN 1.75    Recent Results (from the past 240 hour(s))  MRSA PCR Screening     Status: None   Collection Time: 03/01/17 11:30 PM  Result Value Ref Range Status   MRSA by PCR NEGATIVE NEGATIVE Final    Comment:        The GeneXpert MRSA Assay (FDA approved for NASAL specimens only), is one component of a comprehensive MRSA colonization surveillance program. It is not intended to diagnose MRSA infection nor to guide or monitor treatment for MRSA infections. Performed at [facility], [address], [place], [place] [address]          Radiology Studies: Dg Chest 2 View  Result Date:  03/01/2017 CLINICAL DATA:  Cough EXAM: CHEST  2 VIEW COMPARISON:  10/13/2016 chest radiograph. FINDINGS: Intact sternotomy wires. Stable cardiomediastinal silhouette with borderline mild cardiomegaly. No pneumothorax. No pleural effusion. Slight prominence of the interstitial markings. No acute consolidative airspace disease. IMPRESSION: Borderline mild cardiomegaly and slight prominence of the interstitial markings. Mild congestive heart failure is not excluded. Electronically Signed   By: Delbert Phenix M.D.   On:  03/01/2017 16:02   Ct Angio Chest Pe W And/or Wo Contrast  Result Date: 03/01/2017 CLINICAL DATA:  Nausea and hypertension this morning. High pretest probability for pulmonary embolism. History of nephrotic syndrome, on dialysis. EXAM: CT ANGIOGRAPHY CHEST WITH CONTRAST TECHNIQUE: Multidetector CT imaging of the chest was performed using the standard protocol during bolus administration of intravenous contrast. Multiplanar CT image reconstructions and MIPs were obtained to evaluate the vascular anatomy. CONTRAST:  100mL ISOVUE-370 IOPAMIDOL (ISOVUE-370) INJECTION 76% COMPARISON:  02/22/2017 FINDINGS: Cardiovascular: Satisfactory opacification of the pulmonary arteries to the segmental level. No evidence of pulmonary embolism. Cardiomegaly with hypertrophic appearance of the left ventricle. Atherosclerotic calcification, status post CABG. Detection of graft patency is limited due to early aortic opacification. Mediastinum/Nodes: Borderline right paratracheal lymph node size that is stable at up to 12 mm in short axis. Lungs/Pleura: Focal airspace opacity in the right middle lobe persists but appears slightly decreased in bulk and there is slightly decreased regional airspace opacity. Pneumonia is favored over a mass. Upper Abdomen: Negative Musculoskeletal: No acute or aggressive finding. Review of the MIP images confirms the above findings. IMPRESSION: 1. Negative for pulmonary embolism. 2. Focal  opacity in the right middle lobe as described on chest CT 7 days prior. There has been subtle decrease in volume favoring improving pneumonia over a neoplasm. This finding is occult radiographically; recommend chest CT follow-up in 6-12 weeks. 3. Cardiomegaly and atherosclerosis. Electronically Signed   By: Marnee SpringJonathon  Watts M.D.   On: 03/01/2017 15:58        Scheduled Meds: . aspirin EC  81 mg Oral Daily  . atorvastatin  80 mg Oral q1800  . calcitRIOL  1.25 mcg Oral Q M,W,F-HD  . calcium acetate  2,001 mg Oral TID WC  . cinacalcet  30 mg Oral Q M,W,F  . citalopram  40 mg Oral QHS  . dextromethorphan-guaiFENesin  1 tablet Oral BID  . doxycycline  100 mg Oral Q12H  . gabapentin  200 mg Oral TID  . heparin  5,000 Units Subcutaneous Q8H  . insulin aspart  0-5 Units Subcutaneous QHS  . insulin aspart  0-9 Units Subcutaneous TID WC  . insulin glargine  20 Units Subcutaneous QHS  . ipratropium-albuterol  3 mL Nebulization BID  . levothyroxine  50 mcg Oral QAC breakfast  . metoprolol tartrate  100 mg Oral BID   Continuous Infusions: . ceFEPime (MAXIPIME) IV Stopped (03/02/17 1845)  . [START ON 03/07/2017] ferric gluconate (FERRLECIT/NULECIT) IV       LOS: 1 day    Time spent:35 minutes     Alba CoryBelkys A Clovis Warwick, MD [facility] Pager [phone]  If 7PM-7AM, please contact night-coverage [email or web address] Password [email or web address] 03/03/2017, 2:18 PM

## 2017-03-03 NOTE — Evaluation (Signed)
Clinical/Bedside Swallow Evaluation Patient Details  Name: Russell [name] MRN: [id number] Date of Birth: March 14, 1970  Today's Date: 03/03/2017 Time: SLP Start Time (ACUTE ONLY): 1518 SLP Stop Time (ACUTE ONLY): 1530 SLP Time Calculation (min) (ACUTE ONLY): 12 min  Past Medical History:  Past Medical History:  Diagnosis Date  . Anxiety   . Asthma   . 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 Daily headache   . Depression   . DVT (deep venous thrombosis), H/o 01/2014-on Xarelto 03/24/2014   LLE  . ESRD (end stage renal disease) on dialysis [facility])    "[place], MWF" (06/29/2016)  . Gait abnormality 12/22/2016  . Gastroparesis 12/22/2016  . Hypertension   . Hypothyroidism   . Nephrotic syndrome 05/18/2014  . Neuropathy   . Type 2 diabetes mellitus with diabetic nephropathy [facility])    Past Surgical History:  Past Surgical History:  Procedure Laterality Date  . ANKLE FRACTURE SURGERY Right 1988  . AV FISTULA PLACEMENT Left 01/01/2015   Procedure: CREATION OF LEFT RADIAL CEPHALIC ARTERIOVENOUS (AV) FISTULA ;  Surgeon: Pryor Ochoa, MD;  Location: [facility] OR;  Service: Vascular;  Laterality: Left;  . CAPD REMOVAL N/A 05/19/2016   Procedure: PD CATH REMOVAL;  Surgeon: Abigail Miyamoto, MD;  Location: [facility] OR;  Service: General;  Laterality: N/A;  . CARDIAC CATHETERIZATION N/A 09/22/2015   Procedure: Left Heart Cath and Coronary Angiography;  Surgeon: Iran Ouch, MD;  [facility]: [facility] INVASIVE CV LAB;  Service: Cardiovascular;  Laterality: N/A;  . CORONARY ARTERY BYPASS GRAFT N/A 09/28/2015   Procedure: CORONARY ARTERY BYPASS GRAFTING (CABG) x 3 UTILIZING LEFT MAMMARY ARTERY AND ENDOSCOPICALLY HARVESTED LEFT GREATER SAPHENOUS VEIN.;  Surgeon: Delight Ovens, MD;  Location: [facility] OR;   Service: Open Heart Surgery;  Laterality: N/A;  . ENDOVEIN HARVEST OF GREATER SAPHENOUS VEIN Left 09/28/2015   Procedure: ENDOVEIN HARVEST OF GREATER SAPHENOUS VEIN;  Surgeon: Delight Ovens, MD;  Location: [facility] OR;  Service: Open Heart Surgery;  Laterality: Left;  . ESOPHAGOGASTRODUODENOSCOPY N/A 03/29/2014   Procedure: ESOPHAGOGASTRODUODENOSCOPY (EGD);  Surgeon: Dorena Cookey, MD;  Location: [facility]COPY;  Service: Endoscopy;  Laterality: N/A;  . EYE SURGERY    . FRACTURE SURGERY    . INSERTION OF DIALYSIS CATHETER Right 01/05/2015   Procedure: INSERTION OF RIGHT INTERNAL JUGULAR DIALYSIS CATHETER;  Surgeon: Fransisco Hertz, MD;  Location: [facility] OR;  Service: Vascular;  Laterality: Right;  . LEFT HEART CATH AND CORS/GRAFTS ANGIOGRAPHY N/A 09/13/2016   Procedure: LEFT HEART CATH AND CORS/GRAFTS ANGIOGRAPHY;  Surgeon: Kathleene Hazel, MD;  [facility] INV[facility] LAB;  Service: Cardiovascular;  Laterality: N/A;  . RETINAL LASER PROCEDURE Bilateral   . TEE WITHOUT CARDIOVERSION N/A 09/28/2015   Procedure: TRANSESOPHAGEAL ECHOCARDIOGRAM (TEE);  Surgeon: Delight Ovens, MD;  Location: [facility] OR;  Service: Open Heart Surgery;  Laterality: N/A;  . TONSILLECTOMY AND ADENOIDECTOMY  1970s   HPI:  Russell [name] is a 47 y.o. male With pmhx significant for anxiety, asthma, CAD, DVT, depression, gastroparesis, severe erosive esophagitis, DM II who presented with SOB. He was recently diagnosed with CIDP. CTA 03/01/17 showed pna. Per MD notes, pt reports coughing while eating. EGD 03/29/14 findings of severe exudative circumferential esophagitis, appearance consistent with severe reflux.    Assessment / Plan / Recommendation Clinical  Impression  Patient presents with delayed coughing after solids and multiple sips of thin liquids, most likely reflective of primary esophageal dysphagia given history. Pt endorses foreign body sensation at thyroid notch, worse with solids. With consecutive sips of thin liquids, pt  presents with belching and states "it feels like it won't go down." Recommend regular diet with thin liquids, esophageal precautions. SLP will follow briefly for differential diagnosis given recent CIDP diagnosis; cranial nerve examination is unremarkable. Recommend further GI w/u; consider esophagram. Pt remains at risk of aspiration due to history of reflux. SLP Visit Diagnosis: Dysphagia, unspecified (R13.10)    Aspiration Risk  Mild aspiration risk    Diet Recommendation Regular;Thin liquid   Liquid Administration via: Cup;Straw Medication Administration: Whole meds with liquid Supervision: Patient able to self feed Compensations: Slow rate;Small sips/bites;Follow solids with liquid Postural Changes: Seated upright at 90 degrees;Remain upright for at least 30 minutes after po intake    Other  Recommendations Recommended Consults: Consider GI evaluation;Consider esophageal assessment Oral Care Recommendations: Oral care BID   Follow up Recommendations Other (comment)(tbd)      Frequency and Duration min 1 x/week  1 week       Prognosis Prognosis for Safe Diet Advancement: Good      Swallow Study   General Date of Onset: 03/29/17 HPI: Russell [name] is a 47 y.o. male With pmhx significant for anxiety, asthma, CAD, DVT, depression, gastroparesis, severe erosive esophagitis, DM II who presented with SOB. He was recently diagnosed with CIDP. CTA 03/01/17 showed pna. Per MD notes, pt reports coughing while eating. EGD 03/29/14 findings of severe exudative circumferential esophagitis, appearance consistent with severe reflux.  Type of Study: Bedside Swallow Evaluation Previous Swallow Assessment: see HPI Diet Prior to this Study: Regular;Thin liquids Temperature Spikes Noted: No Respiratory Status: Room air History of Recent Intubation: No Behavior/Cognition: Alert;Cooperative;Pleasant mood Oral Cavity Assessment: Within Functional Limits Oral Care Completed by SLP: No Oral  Cavity - Dentition: Adequate natural dentition Vision: Functional for self-feeding Self-Feeding Abilities: Able to feed self Patient Positioning: Upright in bed Baseline Vocal Quality: Normal Volitional Cough: Strong Volitional Swallow: Able to elicit    Oral/Motor/Sensory Function Overall Oral Motor/Sensory Function: Within functional limits   Ice Chips Ice chips: Not tested   Thin Liquid Thin Liquid: Within functional limits    Nectar Thick Nectar Thick Liquid: Not tested   Honey Thick Honey Thick Liquid: Not tested   Puree Puree: Within functional limits Presentation: Self Fed;Spoon   Solid   GO   Solid: Within functional limits Presentation: Self Fed       Rondel BatonMary Beth Kort Stettler, MS, CCC-SLP Speech-Language Pathologist [phone]  Arlana LindauMary E Janalyn Higby 03/03/2017,3:39 PM

## 2017-03-04 ENCOUNTER — Inpatient Hospital Stay (HOSPITAL_COMMUNITY): Payer: Medicare Other

## 2017-03-04 DIAGNOSIS — G6181 Chronic inflammatory demyelinating polyneuritis: Secondary | ICD-10-CM

## 2017-03-04 DIAGNOSIS — I340 Nonrheumatic mitral (valve) insufficiency: Secondary | ICD-10-CM

## 2017-03-04 DIAGNOSIS — I5021 Acute systolic (congestive) heart failure: Secondary | ICD-10-CM

## 2017-03-04 LAB — GLUCOSE, CAPILLARY
GLUCOSE-CAPILLARY: 191 mg/dL — AB (ref 65–99)
GLUCOSE-CAPILLARY: 188 mg/dL — AB (ref 65–99)
Glucose-Capillary: 166 mg/dL — ABNORMAL HIGH (ref 65–99)
Glucose-Capillary: 185 mg/dL — ABNORMAL HIGH (ref 65–99)

## 2017-03-04 LAB — ECHOCARDIOGRAM COMPLETE
HEIGHTINCHES: 74 in
Weight: 4059.99 oz

## 2017-03-04 MED ORDER — PANTOPRAZOLE SODIUM 40 MG PO TBEC
40.0000 mg | DELAYED_RELEASE_TABLET | Freq: Two times a day (BID) | ORAL | Status: DC
  Administered 2017-03-04 – 2017-03-13 (×18): 40 mg via ORAL
  Filled 2017-03-04 (×19): qty 1

## 2017-03-04 MED ORDER — PERFLUTREN LIPID MICROSPHERE
1.0000 mL | INTRAVENOUS | Status: AC | PRN
  Administered 2017-03-04: 2 mL via INTRAVENOUS
  Filled 2017-03-04: qty 10

## 2017-03-04 MED ORDER — FLUTICASONE PROPIONATE 50 MCG/ACT NA SUSP
1.0000 | Freq: Every day | NASAL | Status: DC
  Administered 2017-03-04 – 2017-03-12 (×9): 1 via NASAL
  Filled 2017-03-04 (×2): qty 16

## 2017-03-04 MED ORDER — METOPROLOL SUCCINATE ER 50 MG PO TB24
100.0000 mg | ORAL_TABLET | Freq: Two times a day (BID) | ORAL | Status: DC
  Administered 2017-03-04 – 2017-03-09 (×10): 100 mg via ORAL
  Filled 2017-03-04: qty 2
  Filled 2017-03-04 (×2): qty 1
  Filled 2017-03-04: qty 2
  Filled 2017-03-04: qty 1
  Filled 2017-03-04 (×7): qty 2

## 2017-03-04 NOTE — Consult Note (Signed)
Cardiology Consultation:   Patient ID: Russell [name]; [id number]; 06/07/1970   Admit date: 03/01/2017 Date of Consult: 03/04/2017  Primary Care Provider: [name], MD Primary Cardiologist: Dr Jens Som Primary Electrophysiologist:  na   Patient Profile:   Russell [name] is a 48 y.o. male with a hx of ESRD, CAD with prior CABG, HTN, DM2,  who is being seen today for the evaluation of SOB at the request of Dr Sunnie Nielsen.Marland Kitchen  History of Present Illness:   Russell [name] 47 yo male history of ESRD, CAD with prior CABG, labile HTN, DM2, prior DVT, admitted with SOB on 03/01/17. Treated for pneumonia by primary team. SOB and cough have been improving. Cardiology is consulted for a newly detected drop in LVEF by echo.   He has had some atypical chest pain. Pressuing like feeling midchest. Worst with breathing and coughing. Can occur at rest or with activity. No significant LE edema or orthopnea, volume control is through hemodilaysis.    K 3.9 Cr 6 Hgb 12.3 Plt 145 CT PE: no PE. Focal RML opacity, pneumona vs neoplasm.  Cath 09/2016 LAD 80%, ramus 90%, D1 90%. Patent LCX, RCA patent. SVG-ramus patent, SVG-D1 is patent, LIMA-LAD is patent.  Past Medical History:  Diagnosis Date  . Anxiety   . Asthma   . 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 Daily headache   . Depression   . DVT (deep venous thrombosis), H/o 01/2014-on Xarelto 03/24/2014   LLE  . ESRD (end stage renal disease) on dialysis [facility])    "[place], MWF" (06/29/2016)  . Gait abnormality 12/22/2016  . Gastroparesis 12/22/2016  . Hypertension   . Hypothyroidism   . Nephrotic syndrome 05/18/2014  . Neuropathy   . Type 2 diabetes mellitus with diabetic nephropathy [facility])     Past Surgical History:  Procedure  Laterality Date  . ANKLE FRACTURE SURGERY Right 1988  . AV FISTULA PLACEMENT Left 01/01/2015   Procedure: CREATION OF LEFT RADIAL CEPHALIC ARTERIOVENOUS (AV) FISTULA ;  Surgeon: Pryor Ochoa, MD;  Location: [facility] OR;  Service: Vascular;  Laterality: Left;  . CAPD REMOVAL N/A 05/19/2016   Procedure: PD CATH REMOVAL;  Surgeon: Abigail Miyamoto, MD;  Location: [facility] OR;  Service: General;  Laterality: N/A;  . CARDIAC CATHETERIZATION N/A 09/22/2015   Procedure: Left Heart Cath and Coronary Angiography;  Surgeon: Iran Ouch, MD;  [facility] INV[facility] LAB;  Service: Cardiovascular;  Laterality: N/A;  . CORONARY ARTERY BYPASS GRAFT N/A 09/28/2015   Procedure: CORONARY ARTERY BYPASS GRAFTING (CABG) x 3 UTILIZING LEFT MAMMARY ARTERY AND ENDOSCOPICALLY HARVESTED LEFT GREATER SAPHENOUS VEIN.;  Surgeon: Delight Ovens, MD;  Location: [facility] OR;  Service: Open Heart Surgery;  Laterality: N/A;  . ENDOVEIN HARVEST OF GREATER SAPHENOUS VEIN Left 09/28/2015   Procedure: ENDOVEIN HARVEST OF GREATER SAPHENOUS VEIN;  Surgeon: Delight Ovens, MD;  Location: [facility] OR;  Service: Open Heart Surgery;  Laterality: Left;  . ESOPHAGOGASTRODUODENOSCOPY N/A 03/29/2014   Procedure: ESOPHAGOGASTRODUODENOSCOPY (EGD);  Surgeon: Dorena Cookey, MD;  Location: [facility];  Service: Endoscopy;  Laterality: N/A;  . EYE SURGERY    . FRACTURE SURGERY    . INSERTION OF DIALYSIS CATHETER Right 01/05/2015   Procedure: INSERTION OF RIGHT INTERNAL JUGULAR DIALYSIS CATHETER;  Surgeon: Fransisco Hertz, MD;  [facility] OR;  Service: Vascular;  Laterality: Right;  . LEFT HEART CATH AND CORS/GRAFTS ANGIOGRAPHY N/A 09/13/2016   Procedure: LEFT HEART CATH AND CORS/GRAFTS ANGIOGRAPHY;  Surgeon: Kathleene HazelMcAlhany, Christopher D, MD;  [facility] INV[facility] LAB;  Service: Cardiovascular;  Laterality: N/A;  . RETINAL LASER PROCEDURE Bilateral   . TEE WITHOUT CARDIOVERSION N/A 09/28/2015   Procedure: TRANSESOPHAGEAL ECHOCARDIOGRAM (TEE);  Surgeon: Delight OvensEdward B  Gerhardt, MD;  [facility];  Service: Open Heart Surgery;  Laterality: N/A;  . TONSILLECTOMY AND ADENOIDECTOMY  1970s       Inpatient Medications: Scheduled Meds: . aspirin EC  81 mg Oral Daily  . atorvastatin  80 mg Oral q1800  . calcitRIOL  1.25 mcg Oral Q M,W,F-HD  . calcium acetate  2,001 mg Oral TID WC  . cinacalcet  30 mg Oral Q M,W,F  . citalopram  40 mg Oral QHS  . dextromethorphan-guaiFENesin  1 tablet Oral BID  . doxycycline  100 mg Oral Q12H  . fluticasone  1 spray Each Nare Daily  . gabapentin  200 mg Oral TID  . heparin  5,000 Units Subcutaneous Q8H  . insulin aspart  0-5 Units Subcutaneous QHS  . insulin aspart  0-9 Units Subcutaneous TID WC  . insulin glargine  20 Units Subcutaneous QHS  . levothyroxine  50 mcg Oral QAC breakfast  . metoprolol tartrate  100 mg Oral BID  . pantoprazole  40 mg Oral BID   Continuous Infusions: . ceFEPime (MAXIPIME) IV 1 g (03/03/17 1752)  . [START ON 03/07/2017] ferric gluconate (FERRLECIT/NULECIT) IV     PRN Meds: acetaminophen, calcium acetate, clonazePAM, HYDROmorphone, hydrOXYzine, ipratropium-albuterol, metoCLOPramide, ondansetron (ZOFRAN) IV  Allergies:    Allergies  Allergen Reactions  . Nsaids Other (See Comments)    Told to avoid all nsaids due to kidney disease   . Tape Other (See Comments)    Welts result, if left for a long amount of time    Social History:   Social History   Socioeconomic History  . Marital status: Single    Spouse name: Not on file  . Number of children: 0  . Years of education: College  . Highest education level: Not on file  Social Needs  . Financial resource strain: Not on file  . Food insecurity - worry: Not on file  . Food insecurity - inability: Not on file  . Transportation needs - medical: Not on file  . Transportation needs - non-medical: Not on file  Occupational History  . Occupation: Disabled    Comment: Data processing managerMaintenance mechanic  Tobacco Use  . Smoking status: Never  Smoker  . Smokeless tobacco: Never Used  Substance and Sexual Activity  . Alcohol use: Yes    Alcohol/week: 0.0 oz    Comment: 06/29/2016 "might have a few drinks/year"  . Drug use: No  . Sexual activity: Yes  Other Topics Concern  . Not on file  Social History Narrative   Patient currently lives with his fiance   Works with facilities   Nonsmoker, nondrinker   Caffeine use: none   Right handed     Family History:    Family History  Problem Relation Age of Onset  . Obesity Mother        Patient states that family members have no other medical illnesses other than what I have described  . Kidney cancer Maternal Grandmother   . Cancer Father 50       AML  . Heart disease Unknown  No family history     ROS:  Please see the history of present illness.  All other ROS reviewed and negative.     Physical Exam/Data:   Vitals:   03/03/17 1011 03/03/17 2126 03/04/17 0459 03/04/17 0900  BP: 126/68 139/83 108/77 109/67  Pulse: 82 81 73 70  Resp: 20 18 18 18   Temp: 97.6 F (36.4 C) 97.9 F (36.6 C) (!) 97.5 F (36.4 C) 97.7 F (36.5 C)  TempSrc: Oral Oral Oral Oral  SpO2: 100% 97% 98% 100%  Weight:  253 lb 12 oz (115.1 kg)    Height:        Intake/Output Summary (Last 24 hours) at 03/04/2017 1241 Last data filed at 03/04/2017 0900 Gross per 24 hour  Intake 840 ml  Output 0 ml  Net 840 ml   Filed Weights   03/02/17 1639 03/02/17 2214 03/03/17 2126  Weight: 245 lb 6 oz (111.3 kg) 253 lb 15.5 oz (115.2 kg) 253 lb 12 oz (115.1 kg)   Body mass index is 32.58 kg/m.  General:  Well nourished, well developed, in no acute distress HEENT: normal Lymph: no adenopathy Neck: no JVD Endocrine:  No thryomegaly Cardiac:  normal S1, S2; RRR; no murmur  Lungs:  clear to auscultation bilaterally, no wheezing, rhonchi or rales  Abd: soft, nontender, no hepatomegaly  Ext: no edema Musculoskeletal:  No deformities, BUE and BLE strength normal and equal Skin: warm and dry    Neuro:  CNs 2-12 intact, no focal abnormalities noted Psych:  Normal affect    Laboratory Data:  Chemistry Recent Labs  Lab 03/01/17 1312 03/01/17 2049 03/02/17 0510 03/03/17 1041  NA 131*  --  132* 132*  K 4.2  --  4.0 3.9  CL 88*  --  92* 91*  CO2 25  --  25 26  GLUCOSE 337*  --  256* 232*  BUN 29*  --  37* 29*  CREATININE 6.90* 7.15* 7.88* 6.09*  CALCIUM 9.3  --  8.9 9.4  GFRNONAA 9* 8* 7* 10*  GFRAA 10* 10* 8* 12*  ANIONGAP 18*  --  15 15    Recent Labs  Lab 03/01/17 1312  PROT 7.9  ALBUMIN 3.7  AST 21  ALT 14*  ALKPHOS 82  BILITOT 0.8   Hematology Recent Labs  Lab 03/01/17 2049 03/02/17 0510 03/03/17 1041  WBC 10.0 7.3 6.7  RBC 4.31 3.86* 4.14*  HGB 12.7* 11.3* 12.3*  HCT 38.1* 34.9* 37.8*  MCV 88.4 90.4 91.3  MCH 29.5 29.3 29.7  MCHC 33.3 32.4 32.5  RDW 15.0 15.7* 15.8*  PLT 282 263 145*   Cardiac EnzymesNo results for input(s): TROPONINI in the last 168 hours.  Recent Labs  Lab 03/01/17 1355 03/01/17 1613  TROPIPOC 0.04 0.04    BNPNo results for input(s): BNP, PROBNP in the last 168 hours.  DDimer No results for input(s): DDIMER in the last 168 hours.  Radiology/Studies:  Dg Chest 2 View  Result Date: 03/01/2017 CLINICAL DATA:  Cough EXAM: CHEST  2 VIEW COMPARISON:  10/13/2016 chest radiograph. FINDINGS: Intact sternotomy wires. Stable cardiomediastinal silhouette with borderline mild cardiomegaly. No pneumothorax. No pleural effusion. Slight prominence of the interstitial markings. No acute consolidative airspace disease. IMPRESSION: Borderline mild cardiomegaly and slight prominence of the interstitial markings. Mild congestive heart failure is not excluded. Electronically Signed   By: Delbert Phenix M.D.   On: 03/01/2017 16:02   Ct Angio Chest Pe W And/or Wo Contrast  Result Date:  03/01/2017 CLINICAL DATA:  Nausea and hypertension this morning. High pretest probability for pulmonary embolism. History of nephrotic syndrome, on dialysis.  EXAM: CT ANGIOGRAPHY CHEST WITH CONTRAST TECHNIQUE: Multidetector CT imaging of the chest was performed using the standard protocol during bolus administration of intravenous contrast. Multiplanar CT image reconstructions and MIPs were obtained to evaluate the vascular anatomy. CONTRAST:  ISOVUE-370 IOPAMIDOL (ISOVUE-370) INJECTION 76% COMPARISON:  02/22/2017 FINDINGS: Cardiovascular: Satisfactory opacification of the pulmonary arteries to the segmental level. No evidence of pulmonary embolism. Cardiomegaly with hypertrophic appearance of the left ventricle. Atherosclerotic calcification, status post CABG. Detection of graft patency is limited due to early aortic opacification. Mediastinum/Nodes: Borderline right paratracheal lymph node size that is stable at up to 12 mm in short axis. Lungs/Pleura: Focal airspace opacity in the right middle lobe persists but appears slightly decreased in bulk and there is slightly decreased regional airspace opacity. Pneumonia is favored over a mass. Upper Abdomen: Negative Musculoskeletal: No acute or aggressive finding. Review of the MIP images confirms the above findings. IMPRESSION: 1. Negative for pulmonary embolism. 2. Focal opacity in the right middle lobe as described on chest CT 7 days prior. There has been subtle decrease in volume favoring improving pneumonia over a neoplasm. This finding is occult radiographically; recommend chest CT follow-up in 6-12 weeks. 3. Cardiomegaly and atherosclerosis. Electronically Signed   By: Marnee Spring M.D.   On: 03/01/2017 15:58    Assessment and Plan:   1. Acute systolic HF - echo 10/2016 LVEF 60-65%.  - 03/2017 echo LVEF down to 25-30%, diffuse hypokinesis, restricitive diastolic function - last cath 01/6107 patent grafts - troponins negative,EKG  lateral TWIs. Some prior EKGs have shown this pattern before but appear more prominent. Atypical chest pain, not consistent with cardiac etiology - fluid management per  HD - historically labile bp's, for now we will just change his lopressor to Toprol and follow hemodynamics.   - etiologies for acute drop in LVEF include possible ICM, as well as stress induced CM given his acute infection - will need cath to evaluate coronaries at some point, preferably infection would be further resolved and there would be better clarity on his lung mass as this would greatly affect considerations for revascularization. - discussed with primary team who will touch base with pulmonary tomorrow. If does require extended period of time for diagnosis of lung mass (current plan is abx and PET scan in a few weeks), would consider diagnostic cath only on Tuesday.    2. Pneumonia - abx per primary team - some concern for possible dysphagia/aspiration, workup ongoing with plans for esophogram tomorrow.   3. Chest pain - CT PE negative - troponins negative - patient with acute drop in LVEF to 25-30% this admission  4 CAD - last cath 09/2016 with patent grafts - negative troponin, more prominent lateral ST/T changes on EKG. Chest pain is atypical - no evidence of ACS  5. Lung mass - CT findings question possible pneumonia vs malignancy - plans for PET scan according to notes      For questions or updates, please contact [facility] Please consult [email or web address] for contact info under Cardiology/STEMI.   Joanie Coddington, MD  03/04/2017 12:41 PM

## 2017-03-04 NOTE — Progress Notes (Signed)
  Echocardiogram 2D Echocardiogram has been performed.  Russell [name] T Shantal Roan 03/04/2017, 9:08 AM

## 2017-03-04 NOTE — Progress Notes (Addendum)
Pharmacy Antibiotic Note  Russell [name] is a 47 y.o. male admitted on 03/01/2017 with pneumonia seen on CT.  Pharmacy has been consulted for cefepime dosing. Vancomycin was stopped 3/2 d/t negative MRSA PCR.  Has hx of ESRD on HD MWF, next HD session planned 3/4. Pt is afebrile, WBCs 6.7.  Plan: Cefepime 1g IV every 24 hours Doxycycline 100mg  Q12hrs (MD dosing for atypical coverage) Monitor HD schedule, cx results, LOT  Temp (24hrs), Avg:97.7 F (36.5 C), Min:97.5 F (36.4 C), Max:97.9 F (36.6 C)  Recent Labs  Lab 03/01/17 1312 03/01/17 1615 03/01/17 2049 03/02/17 0510 03/03/17 1041  WBC 8.2  --  10.0 7.3 6.7  CREATININE 6.90*  --  7.15* 7.88* 6.09*  LATICACIDVEN  --  1.75  --   --   --     Estimated Creatinine Clearance: 20.5 mL/min (A) (by C-G formula based on SCr of 6.09 mg/dL (H)).    Antimicrobials this admission: Vancomycin 2/28 >> 3/2 Cefepime 2/28 >>  Azithro 3/1>3/2 (switched d/t QTc prolongation) Doxy 3/2>  Microbiology results: 2/28 Sputum: sent  MRSA PCR negative  Thank you for allowing pharmacy to be a part of this patient's care.   Nolen MuAustin J Kyrollos Cordell PharmD PGY1 Pharmacy Practice Resident 03/04/2017 10:39 AM Pager: [phone] Phone: [phone]

## 2017-03-04 NOTE — Evaluation (Signed)
Occupational Therapy Evaluation Patient Details Name: Russell [name] MRN: [id number] DOB: Nov 08, 1970 Today's Date: 03/04/2017    History of Present Illness Russell [name] is a 47 y.o. male  With pmhx significant for anxiety, CABG X 3 2017, asthma, CAD, DVT, depression, CIDP, gastroparesis, DM II who presents with sob. Pt with sob and ill feeling for greater than 2 weeks.    Clinical Impression   PATIENT WAS SEEN FOR SKILLED OT TO MAXIMIZE I AND SAFETY WITH ADLS AND ADLS TRANSFERS. PATIENT IS AT BASELINE FOR SELF CARE. PATIENT IS RECEIVING HOME HEALTH SERVICES FOR B UE STRENGTHENING TO INCREASE ABILITY WITH SELF CARE. PATIENT DOES NOT WANT OT IN HOSPITAL AND WANTS TO RESUME HHOT AT DC. ONE TIME EVALUATION PERFORMED.     Follow Up Recommendations  Home health OT    Equipment Recommendations       Recommendations for Other Services       Precautions / Restrictions Precautions Precautions: Fall Restrictions Weight Bearing Restrictions: No      Mobility Bed Mobility Overal bed mobility: Independent                Transfers Overall transfer level: Independent                    Balance                                           ADL either performed or assessed with clinical judgement   ADL Overall ADL's : At baseline                                       General ADL Comments: PATIENT HAS DECREASED B UE STRENGTH WHICH LIMITS I WITH ADLS. PATIENT IS WORKING WITH HOME HEALTH TO ADDRESS.      Vision Baseline Vision/History: Wears glasses;Cataracts;Retinopathy;Macular Degeneration Wears Glasses: At all times       Perception     Praxis      Pertinent Vitals/Pain Pain Assessment: 0-10 Pain Score: 8  Pain Location: FEET AND HANDS Pain Descriptors / Indicators: Burning Pain Intervention(s): Patient requesting pain meds-RN notified     Hand Dominance Right   Extremity/Trunk Assessment Upper Extremity  Assessment Upper Extremity Assessment: Generalized weakness           Communication Communication Communication: No difficulties   Cognition Arousal/Alertness: Awake/alert Behavior During Therapy: WFL for tasks assessed/performed Overall Cognitive Status: Within Functional Limits for tasks assessed                                     General Comments       Exercises     Shoulder Instructions      Home Living Family/patient expects to be discharged to:: Private residence Living Arrangements: Parent Available Help at Discharge: Family;Available PRN/intermittently Type of Home: House Home Access: Level entry     Home Layout: One level     Bathroom Shower/Tub: Chief Strategy OfficerTub/shower unit   Bathroom Toilet: Standard     Home Equipment: Cane - single point          Prior Functioning/Environment Level of Independence: Needs assistance    ADL's / Homemaking Assistance Needed: PATIENT NEEDED ASSIST WITH SOCKS AND  SHOES. PATIENT STATES THAT HIS HANDS ARE TOO WEAK TO USE DME.             OT Problem List:        OT Treatment/Interventions:      OT Goals(Current goals can be found in the care plan section) Acute Rehab OT Goals Patient Stated Goal: RESUME HH TO REGAIN STRENGTH OT Goal Formulation: With patient  OT Frequency:     Barriers to D/C:            Co-evaluation              AM-PAC PT "6 Clicks" Daily Activity     Outcome Measure Help from another person eating meals?: None Help from another person taking care of personal grooming?: None Help from another person toileting, which includes using toliet, bedpan, or urinal?: None Help from another person bathing (including washing, rinsing, drying)?: None Help from another person to put on and taking off regular upper body clothing?: None Help from another person to put on and taking off regular lower body clothing?: A Little 6 Click Score: 23   End of Session Nurse Communication: (OK  THERAPY)  Activity Tolerance: Patient tolerated treatment well Patient left: in bed;with call bell/phone within reach  OT Visit Diagnosis: Muscle weakness (generalized) (M62.81)                Time: 2956-2130 OT Time Calculation (min): 38 min Charges:  OT General Charges $OT Visit: 1 Visit OT Evaluation $OT Eval Moderate Complexity: 1 Mod OT Treatments $Self Care/Home Management : 8-22 mins G-Codes:     6 CLICKS  Russell [name] 03/04/2017, 11:00 AM

## 2017-03-04 NOTE — Progress Notes (Signed)
PROGRESS NOTE    RENALD [name]  ZOX:[id number] DOB: 16-Dec-1970 DOA: 03/01/2017 PCP: [name], MD   Brief Narrative: Russell [name] is a 47 y.o. male  With pmhx significant for anxiety, asthma, CAD, DVT, depression, gastroparesis, DM II who presents with sob. Pt with sob and ill feeling for greater than 2 weeks. No fever or cough. Had been seen by his MD. [name] for f/u today. Told to start levaquin. Got worse and went to ED for eval.   ED Course: Started on Vanc and cefepime. Blood cultures missed. CTA showed pna. Hospitalists consulted for admit.  Assessment & Plan:   Active Problems:   Hypothyroidism (acquired)   Diabetic neuropathy (HCC)   Uncontrolled diabetes mellitus type 2 with peripheral artery disease (HCC)   Morbid obesity (HCC)   Essential hypertension, benign   ESRD on dialysis (HCC)   History of DVT of lower extremity   Quadriparesis (HCC)   CIDP (chronic inflammatory demyelinating polyneuropathy) (HCC)   CAP (community acquired pneumonia)  1-PNA; lung mass;  CT with persistent PNA since 7 days ago.  Continue with cefepime.  Strept pneumonia ordered.  Schedule Nebulizer.  HIV negative.  Of note patient was evaluated by Dr Isaiah Serge, pulmonologist 2-28 for lung mass, patient received one dose of vancomycin and Fortaz during HD but patient refuse IV antibiotics. Dr Isaiah Serge prescribe him Levaquin for 14 days. Patient came to ED complaining of SOB.  Patient will need PET scan in 4-5 weeks.  Doxy to cover atypical.  Stop vancomycin, MRSA PCR negative.  Speech evaluation, recommend esophageal dysphagia evaluation. Esophagogram ordered.   Chest pain, Dyspnea; suspect related to PNA.  CT angio negative for pe.  Troponin times 2 negative.  ECHO with worsening EF 25-30 % prior at 65 %. Will consult cardiology./ had Cath 9 / 2018; 1. Severe stenosis mid LAD. Patent LIMA graft to mid LAD. 2. Severe stenosis moderate caliber first diagonal branch. Patent vein graft to the  Diagonal branch. Severe stenosis large caliber intermediate branch. Patent vein graft to the intermediate branch. Patent Circumflex artery. Patent RCA  History of CABG;  Continue with aspirin, Lipitor, metoprolol./   HTN; Continue with Hydralazine, metoprolol.   DM type II;  Continue with lantus and SSI.   HLD; statins.   Depression/Anxiety Cont celexa No SI/HI Prn klonopin & atarax  Hypothyroidism; continue with synthroid.   CIDP, also DM neuropathy; follows with Dr Anne Hahn. He gets IVIG.  Will order PT , OT   Gastroparesis; reglan change to TID PRN.  Nausea, after coughing. PRN phenergan.   Chronic pain; change dilaudid to PRN.   Prolong QT;  Change azithro to doxy  Improved to 480.   DVT prophylaxis: Heparin.  Code Status: full code.  Family Communication: care discussed with patient.  Disposition Plan: home when stable.   Consultants:   Nephrology    Procedures: ECHO Ef 25--35 %   Antimicrobials: cefepime 2-28 Vancomycin 2-28  Subjective: Report some improvement. Dyspnea improved.   Objective: Vitals:   03/03/17 1011 03/03/17 2126 03/04/17 0459 03/04/17 0900  BP: 126/68 139/83 108/77 109/67  Pulse: 82 81 73 70  Resp: 20 18 18 18   Temp: 97.6 F (36.4 C) 97.9 F (36.6 C) (!) 97.5 F (36.4 C) 97.7 F (36.5 C)  TempSrc: Oral Oral Oral Oral  SpO2: 100% 97% 98% 100%  Weight:  115.1 kg (253 lb 12 oz)    Height:        Intake/Output Summary (Last 24 hours)  at 03/04/2017 1230 Last data filed at 03/04/2017 0900 Gross per 24 hour  Intake 840 ml  Output 0 ml  Net 840 ml   Filed Weights   03/02/17 1639 03/02/17 2214 03/03/17 2126  Weight: 111.3 kg (245 lb 6 oz) 115.2 kg (253 lb 15.5 oz) 115.1 kg (253 lb 12 oz)    Examination:  General exam: NAD Respiratory system: Normal respiratory effort, crackles bases.  Cardiovascular system: S 1, S 2 RRR Gastrointestinal system: BS present, soft, nt Central nervous system:  Quadriparesis.  Extremities:  upper and lower  extremities with bilateral weakness.  Skin; No rash.     Data Reviewed: I have personally reviewed following labs and imaging studies  CBC: Recent Labs  Lab 03/01/17 1312 03/01/17 2049 03/02/17 0510 03/03/17 1041  WBC 8.2 10.0 7.3 6.7  NEUTROABS  --   --  5.4  --   HGB 12.7* 12.7* 11.3* 12.3*  HCT 38.5* 38.1* 34.9* 37.8*  MCV 88.5 88.4 90.4 91.3  PLT 273 282 263 145*   Basic Metabolic Panel: Recent Labs  Lab 03/01/17 1312 03/01/17 1502 03/01/17 2049 03/02/17 0510 03/03/17 1041  NA 131*  --   --  132* 132*  K 4.2  --   --  4.0 3.9  CL 88*  --   --  92* 91*  CO2 25  --   --  25 26  GLUCOSE 337*  --   --  256* 232*  BUN 29*  --   --  37* 29*  CREATININE 6.90*  --  7.15* 7.88* 6.09*  CALCIUM 9.3  --   --  8.9 9.4  MG  --  2.1 2.0  --   --   PHOS  --  6.8* 6.2*  --   --    GFR: Estimated Creatinine Clearance: 20.5 mL/min (A) (by C-G formula based on SCr of 6.09 mg/dL (H)). Liver Function Tests: Recent Labs  Lab 03/01/17 1312  AST 21  ALT 14*  ALKPHOS 82  BILITOT 0.8  PROT 7.9  ALBUMIN 3.7   Recent Labs  Lab 03/01/17 1312  LIPASE 45   No results for input(s): AMMONIA in the last 168 hours. Coagulation Profile: No results for input(s): INR, PROTIME in the last 168 hours. Cardiac Enzymes: No results for input(s): CKTOTAL, CKMB, CKMBINDEX, TROPONINI in the last 168 hours. BNP (last 3 results) No results for input(s): PROBNP in the last 8760 hours. HbA1C: No results for input(s): HGBA1C in the last 72 hours. CBG: Recent Labs  Lab 03/03/17 1154 03/03/17 1714 03/03/17 2122 03/04/17 0737 03/04/17 1153  GLUCAP 252* 294* 207* 166* 185*   Lipid Profile: No results for input(s): CHOL, HDL, LDLCALC, TRIG, CHOLHDL, LDLDIRECT in the last 72 hours. Thyroid Function Tests: No results for input(s): TSH, T4TOTAL, FREET4, T3FREE, THYROIDAB in the last 72 hours. Anemia Panel: No results for input(s): VITAMINB12, FOLATE, FERRITIN, TIBC, IRON,  RETICCTPCT in the last 72 hours. Sepsis Labs: Recent Labs  Lab 03/01/17 1615  LATICACIDVEN 1.75    Recent Results (from the past 240 hour(s))  MRSA PCR Screening     Status: None   Collection Time: 03/01/17 11:30 PM  Result Value Ref Range Status   MRSA by PCR NEGATIVE NEGATIVE Final    Comment:        The GeneXpert MRSA Assay (FDA approved for NASAL specimens only), is one component of a comprehensive MRSA colonization surveillance program. It is not intended to diagnose MRSA infection nor to guide  or monitor treatment for MRSA infections. Performed at [facility], [address] [place], [place] [address]          Radiology Studies: No results found.      Scheduled Meds: . aspirin EC  81 mg Oral Daily  . atorvastatin  80 mg Oral q1800  . calcitRIOL  1.25 mcg Oral Q M,W,F-HD  . calcium acetate  2,001 mg Oral TID WC  . cinacalcet  30 mg Oral Q M,W,F  . citalopram  40 mg Oral QHS  . dextromethorphan-guaiFENesin  1 tablet Oral BID  . doxycycline  100 mg Oral Q12H  . fluticasone  1 spray Each Nare Daily  . gabapentin  200 mg Oral TID  . heparin  5,000 Units Subcutaneous Q8H  . insulin aspart  0-5 Units Subcutaneous QHS  . insulin aspart  0-9 Units Subcutaneous TID WC  . insulin glargine  20 Units Subcutaneous QHS  . levothyroxine  50 mcg Oral QAC breakfast  . metoprolol tartrate  100 mg Oral BID  . pantoprazole  40 mg Oral BID   Continuous Infusions: . ceFEPime (MAXIPIME) IV 1 g (03/03/17 1752)  . [START ON 03/07/2017] ferric gluconate (FERRLECIT/NULECIT) IV       LOS: 2 days    Time spent:35 minutes     Alba Cory, MD [facility] Pager [phone]  If 7PM-7AM, please contact night-coverage [email or web address] Password [email or web address] 03/04/2017, 12:30 PM

## 2017-03-04 NOTE — Evaluation (Signed)
Physical Therapy Evaluation Patient Details Name: Russell [name] MRN: [id number] DOB: Nov 26, 1970 Today's Date: 03/04/2017   History of Present Illness  Russell [name] is a 47 y.o. male  With pmhx significant for anxiety, CABG X 3 2017, asthma, CAD, DVT, depression, CIDP, gastroparesis, DM II who presents with sob. Pt with sob and ill feeling for greater than 2 weeks.     Clinical Impression  Pt presents with mild limitations to functional mobility due to neurological problems in LEs.  Currently independent with assistive device and shows good judgment and safety awareness.  Discussed future benefit from PT should symptoms continue to progress as expected, but currently all needs met by [name].  No further acute PT needs.    Follow Up Recommendations No PT follow up(has HHOT, declines PT until weakness in LEs progresses)    Equipment Recommendations  Other (comment)(Rollator (4 wheeled walker with seat))    Recommendations for Other Services       Precautions / Restrictions Precautions Precautions: Fall Precaution Comments: use RW or other device Restrictions Weight Bearing Restrictions: No      Mobility  Bed Mobility Overal bed mobility: Independent                Transfers Overall transfer level: Independent Equipment used: None;Rolling walker (2 wheeled)                Ambulation/Gait Ambulation/Gait assistance: Supervision Ambulation Distance (Feet): 150 Feet Assistive device: Rolling walker (2 wheeled) Gait Pattern/deviations: Decreased dorsiflexion - left Gait velocity: 1.88 ft/sec Gait velocity interpretation: Below normal speed for age/gender General Gait Details: tends to drag left foot; instructed to exaggerate heel strike to promote dorsiflexion; pt endorses tennis shoes catching on flooring, discussed alternatives  Stairs            Wheelchair Mobility    Modified Rankin (Stroke Patients Only)       Balance Overall balance  assessment: Needs assistance;Mild deficits observed, not formally tested Sitting-balance support: No upper extremity supported;Feet supported Sitting balance-Leahy Scale: Normal     Standing balance support: No upper extremity supported;During functional activity Standing balance-Leahy Scale: Fair Standing balance comment: static and dynamic currently functional but intermittent issues esp with walking req external device for support/ to prevent fall                             Pertinent Vitals/Pain Pain Assessment: 0-10 Pain Score: 6  Pain Location: extremities, back Pain Descriptors / Indicators: Burning Pain Intervention(s): Limited activity within patient's tolerance;Monitored during session;Repositioned    Home Living Family/patient expects to be discharged to:: Private residence Living Arrangements: Parent Available Help at Discharge: Family;Available PRN/intermittently Type of Home: House Home Access: Stairs to enter Entrance Stairs-Rails: None Entrance Stairs-Number of Steps: 1 Home Layout: One level Home Equipment: Cane - single point Additional Comments: log cabin,     Prior Function Level of Independence: Needs assistance   Gait / Transfers Assistance Needed: using cane, occasional tripping/near falls due to left foot drop  ADL's / Homemaking Assistance Needed: see OT note        Hand Dominance   Dominant Hand: Right    Extremity/Trunk Assessment   Upper Extremity Assessment Upper Extremity Assessment: Defer to OT evaluation    Lower Extremity Assessment Lower Extremity Assessment: Generalized weakness;LLE deficits/detail LLE Deficits / Details: dorsiflexion 3-/5 - partial ROM vs gravity with no ability to overcome resistence LLE Sensation: decreased light touch;decreased proprioception(per  pt, no sensation, limited deep pressure, to bil knees) LLE Coordination: decreased gross motor    Cervical / Trunk Assessment Cervical / Trunk  Assessment: Normal  Communication   Communication: No difficulties  Cognition Arousal/Alertness: Awake/alert Behavior During Therapy: WFL for tasks assessed/performed Overall Cognitive Status: Within Functional Limits for tasks assessed                                 General Comments: motivated, aware of progressive nature of diagnosis and determined to fight through      General Comments General comments (skin integrity, edema, etc.): provided light t-band for LE exercise    Exercises     Assessment/Plan    PT Assessment All further PT needs can be met in the next venue of care  PT Problem List Pain;Impaired sensation;Decreased mobility;Decreased activity tolerance;Decreased strength;Decreased range of motion       PT Treatment Interventions      PT Goals (Current goals can be found in the Care Plan section)  Acute Rehab PT Goals PT Goal Formulation: All assessment and education complete, DC therapy    Frequency     Barriers to discharge        Co-evaluation               AM-PAC PT "6 Clicks" Daily Activity  Outcome Measure Difficulty turning over in bed (including adjusting bedclothes, sheets and blankets)?: None Difficulty moving from lying on back to sitting on the side of the bed? : None Difficulty sitting down on and standing up from a chair with arms (e.g., wheelchair, bedside commode, etc,.)?: None Help needed moving to and from a bed to chair (including a wheelchair)?: None Help needed walking in hospital room?: A Little Help needed climbing 3-5 steps with a railing? : A Little 6 Click Score: 22    End of Session Equipment Utilized During Treatment: Gait belt Activity Tolerance: Patient tolerated treatment well;Patient limited by fatigue(standing rest x2) Patient left: in bed;with call bell/phone within reach Nurse Communication: Mobility status PT Visit Diagnosis: Other abnormalities of gait and mobility (R26.89);Other symptoms and  signs involving the nervous system (R29.898)    Time: 1120-1150 PT Time Calculation (min) (ACUTE ONLY): 30 min   Charges:   PT Evaluation $PT Eval Low Complexity: 1 Low PT Treatments $Gait Training: 8-22 mins   PT G Codes:        Kearney Hard, PT, DPT, MS Board Certified Geriatric Clinical Specialist  Herbie Drape 03/04/2017, 2:05 PM

## 2017-03-04 NOTE — Progress Notes (Signed)
[facility] Progress Note   Subjective:  Sitting up in bed, eating breakfast.  Objective Vitals:   03/03/17 1011 03/03/17 2126 03/04/17 0459 03/04/17 0900  BP: 126/68 139/83 108/77 109/67  Pulse: 82 81 73 70  Resp: 20 18 18 18   Temp: 97.6 F (36.4 C) 97.9 F (36.6 C) (!) 97.5 F (36.4 C) 97.7 F (36.5 C)  TempSrc: Oral Oral Oral Oral  SpO2: 100% 97% 98% 100%  Weight:  115.1 kg (253 lb 12 oz)    Height:       Physical Exam General: Obese male, NAD Heart: RRR  Lungs: CTAB Abdomen: soft NT Extremities: Trace LE edema  Dialysis Access: LUE AVF +bruit   Dialysis Orders:  Dialysis Orders: Center: [facility]   on MWF  . EDW 112(last hd decr)  HD Bath 2k, 2.25ca  Time 4 hr Heparin 8000 bolus 2000 interm. Access L FA AVF  Calcitriol 1.25 mcg po/HD    Mircera 50 q 2wks (last on 02/28/17)    Venofer  50mg  q wkly    Assessment/Plan 1. PNA /lung mass/dyspnea- Transitioned to PO Doxy for PNA/Lung mass on CT - F/u for outpatient PET scan in 4-5 weeks. Echo pending  2. ESRD -  HD MWF. Cont on schedule. Next HD 3/4  3. Hypertension/volume  - BP improved with UF/net UF 3L  4. Anemia  - hgb 12.3  No esa needed currently  Follow trend  5. Metabolic bone disease -  Po vit d , binders , (no parsbiv in hosp ) 6. Nutrition - Carb /mod renal diet  7. DM type 2 - per admit  8. Depression / Anxiety - On citalopram  9. Gastroparesis - TID reglan , per admit   Russell Blase PA-C [facility] Pager [phone] 03/04/2017,10:12 AM  LOS: 2 days   Additional Objective Labs: Basic Metabolic Panel: Recent Labs  Lab 03/01/17 1312 03/01/17 1502 03/01/17 2049 03/02/17 0510 03/03/17 1041  NA 131*  --   --  132* 132*  K 4.2  --   --  4.0 3.9  CL 88*  --   --  92* 91*  CO2 25  --   --  25 26  GLUCOSE 337*  --   --  256* 232*  BUN 29*  --   --  37* 29*  CREATININE 6.90*  --  7.15* 7.88* 6.09*  CALCIUM 9.3  --   --  8.9 9.4  PHOS  --  6.8* 6.2*  --   --     CBC: Recent Labs  Lab 03/01/17 1312 03/01/17 2049 03/02/17 0510 03/03/17 1041  WBC 8.2 10.0 7.3 6.7  NEUTROABS  --   --  5.4  --   HGB 12.7* 12.7* 11.3* 12.3*  HCT 38.5* 38.1* 34.9* 37.8*  MCV 88.5 88.4 90.4 91.3  PLT 273 282 263 145*   Blood Culture    Component Value Date/Time   SDES BLOOD RIGHT HAND 05/16/2016 2032   SPECREQUEST  05/16/2016 2032    BOTTLES DRAWN AEROBIC AND ANAEROBIC Blood Culture adequate volume   CULT NO GROWTH 5 DAYS 05/16/2016 2032   REPTSTATUS 05/21/2016 FINAL 05/16/2016 2032    Cardiac Enzymes: No results for input(s): CKTOTAL, CKMB, CKMBINDEX, TROPONINI in the last 168 hours. CBG: Recent Labs  Lab 03/03/17 0754 03/03/17 1154 03/03/17 1714 03/03/17 2122 03/04/17 0737  GLUCAP 142* 252* 294* 207* 166*   Iron Studies: No results for input(s): IRON, TIBC, TRANSFERRIN, FERRITIN in the  last 72 hours. Lab Results  Component Value Date   INR 1.0 09/12/2016   INR 1.25 05/16/2016   INR 1.12 04/27/2016   Medications: . ceFEPime (MAXIPIME) IV 1 g (03/03/17 1752)  . [START ON 03/07/2017] ferric gluconate (FERRLECIT/NULECIT) IV     . aspirin EC  81 mg Oral Daily  . atorvastatin  80 mg Oral q1800  . calcitRIOL  1.25 mcg Oral Q M,W,F-HD  . calcium acetate  2,001 mg Oral TID WC  . cinacalcet  30 mg Oral Q M,W,F  . citalopram  40 mg Oral QHS  . dextromethorphan-guaiFENesin  1 tablet Oral BID  . doxycycline  100 mg Oral Q12H  . fluticasone  1 spray Each Nare Daily  . gabapentin  200 mg Oral TID  . heparin  5,000 Units Subcutaneous Q8H  . insulin aspart  0-5 Units Subcutaneous QHS  . insulin aspart  0-9 Units Subcutaneous TID WC  . insulin glargine  20 Units Subcutaneous QHS  . levothyroxine  50 mcg Oral QAC breakfast  . metoprolol tartrate  100 mg Oral BID  . pantoprazole (PROTONIX) IV  40 mg Intravenous Q12H

## 2017-03-04 NOTE — Progress Notes (Signed)
  Echocardiogram 2D Echocardiogram has been performed.  Anterrio Mccleery T Carden Teel 03/04/2017, 11:14 AM

## 2017-03-05 DIAGNOSIS — I5043 Acute on chronic combined systolic (congestive) and diastolic (congestive) heart failure: Secondary | ICD-10-CM

## 2017-03-05 DIAGNOSIS — R1310 Dysphagia, unspecified: Secondary | ICD-10-CM

## 2017-03-05 DIAGNOSIS — J189 Pneumonia, unspecified organism: Secondary | ICD-10-CM

## 2017-03-05 DIAGNOSIS — I25118 Atherosclerotic heart disease of native coronary artery with other forms of angina pectoris: Secondary | ICD-10-CM

## 2017-03-05 LAB — BASIC METABOLIC PANEL
Anion gap: 16 — ABNORMAL HIGH (ref 5–15)
BUN: 73 mg/dL — ABNORMAL HIGH (ref 6–20)
CALCIUM: 9.1 mg/dL (ref 8.9–10.3)
CO2: 21 mmol/L — AB (ref 22–32)
CREATININE: 10.23 mg/dL — AB (ref 0.61–1.24)
Chloride: 93 mmol/L — ABNORMAL LOW (ref 101–111)
GFR calc non Af Amer: 5 mL/min — ABNORMAL LOW (ref >60)
GFR, EST AFRICAN AMERICAN: 6 mL/min — AB (ref >60)
Glucose, Bld: 249 mg/dL — ABNORMAL HIGH (ref 65–99)
Potassium: 4.5 mmol/L (ref 3.5–5.1)
SODIUM: 130 mmol/L — AB (ref 135–145)

## 2017-03-05 LAB — CBC
HEMATOCRIT: 33 % — AB (ref 39.0–52.0)
Hemoglobin: 10.4 g/dL — ABNORMAL LOW (ref 13.0–17.0)
MCH: 29.2 pg (ref 26.0–34.0)
MCHC: 31.5 g/dL (ref 30.0–36.0)
MCV: 92.7 fL (ref 78.0–100.0)
Platelets: 259 10*3/uL (ref 150–400)
RBC: 3.56 MIL/uL — AB (ref 4.22–5.81)
RDW: 15.7 % — ABNORMAL HIGH (ref 11.5–15.5)
WBC: 11 10*3/uL — AB (ref 4.0–10.5)

## 2017-03-05 LAB — GLUCOSE, CAPILLARY
GLUCOSE-CAPILLARY: 200 mg/dL — AB (ref 65–99)
Glucose-Capillary: 164 mg/dL — ABNORMAL HIGH (ref 65–99)
Glucose-Capillary: 164 mg/dL — ABNORMAL HIGH (ref 65–99)

## 2017-03-05 MED ORDER — LIDOCAINE-PRILOCAINE 2.5-2.5 % EX CREA: 1.0000 "application " | TOPICAL_CREAM | CUTANEOUS | Status: DC | PRN

## 2017-03-05 MED ORDER — HEPARIN SODIUM (PORCINE) 1000 UNIT/ML DIALYSIS
4000.0000 [IU] | Freq: Once | INTRAMUSCULAR | Status: AC
  Administered 2017-03-05: 4000 [IU] via INTRAVENOUS_CENTRAL

## 2017-03-05 MED ORDER — HEPARIN SODIUM (PORCINE) 1000 UNIT/ML DIALYSIS: 1000.0000 [IU] | INTRAMUSCULAR | Status: DC | PRN

## 2017-03-05 MED ORDER — LIDOCAINE HCL (PF) 1 % IJ SOLN: 5.0000 mL | INTRAMUSCULAR | Status: DC | PRN

## 2017-03-05 MED ORDER — PENTAFLUOROPROP-TETRAFLUOROETH EX AERO: 1.0000 "application " | INHALATION_SPRAY | CUTANEOUS | Status: DC | PRN

## 2017-03-05 MED ORDER — SODIUM CHLORIDE 0.9 % IV SOLN: 100.0000 mL | INTRAVENOUS | Status: DC | PRN

## 2017-03-05 NOTE — Progress Notes (Signed)
Inpatient Diabetes Program Recommendations  AACE/ADA: New Consensus Statement on Inpatient Glycemic Control (2015)  Target Ranges:  Prepandial:   less than 140 mg/dL      Peak postprandial:   less than 180 mg/dL (1-2 hours)      Critically ill patients:  140 - 180 mg/dL    Spoke with patient about diabetes and home regimen for diabetes control. Patient reports that he is followed by Dr. Elvera LennoxGherghe for diabetes management. Patient reports being placed on Victoza at his last visit, patient also says that he can't afford his lantus and humalog that he is prescribed outpatient. Patient states he checks his glucose multiple times a day and knows they are mostly in the 200's. Discussed A1C results (11.9% January 2019). Discussed glucose and A1C goals. Discussed importance of checking CBGs and maintaining good CBG control to prevent long-term and short-term complications. Discussed carbohydrates, carbohydrate goals per day and meal, along with portion sizes. Patient had several questions about diet in relation to renal modifications as well.  Patient verbalized understanding of information discussed and he states that he has no further questions at this time related to diabetes. Patient has follow up with Dr. Elvera LennoxGherghe within a week.   Thanks,  Christena DeemShannon Cristen Murcia RN, MSN, [facility] Inpatient Diabetes Coordinator Team Pager [phone] (8a-5p)

## 2017-03-05 NOTE — Consult Note (Signed)
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 Patient was admitted on that day for dyspnea, nausea, hypertension inability to tolerate IV antibiotics at dialysis center.  Started on IV Vanco and cefepime inpatient which he is tolerating Echo while inpatient shows a new reduction in EF and cardiology consulted. Plan is for cardiac catheterization tomorrow.  PCCM has been asked to comment on timing of stent placement and antiplatelet therapy in light of possible need for lung biopsy in the future.  PAST MEDICAL HISTORY :  He  has a past medical history of Anxiety, Asthma, CIDP (chronic inflammatory demyelinating polyneuropathy) (HCC) (01/10/2017), Coronary artery disease involving native coronary artery of native heart with unstable angina pectoris (HCC), Daily headache, Depression, DVT (deep venous thrombosis), H/o 01/2014-on Xarelto (03/24/2014), ESRD (end stage renal disease) on dialysis [facility](HCC), Gait abnormality (12/22/2016), Gastroparesis (12/22/2016), Hypertension, Hypothyroidism, Nephrotic syndrome (05/18/2014), Neuropathy, and Type 2 diabetes mellitus with diabetic nephropathy (HCC).  PAST SURGICAL HISTORY: He  has a past surgical history that includes Ankle fracture surgery (Right, 1988); Esophagogastroduodenoscopy (N/A, 03/29/2014); Fracture surgery; AV fistula placement (Left, 01/01/2015); Insertion of dialysis catheter (Right, 01/05/2015); TEE without  cardioversion (N/A, 09/28/2015); Endoharvest vein of greater saphenous vein (Left, 09/28/2015); CAPD removal (N/A, 05/19/2016); Tonsillectomy and adenoidectomy (1970s); Eye surgery; Retinal laser procedure (Bilateral); Cardiac catheterization (N/A, 09/22/2015); Coronary artery bypass graft (N/A, 09/28/2015); and LEFT HEART CATH AND CORS/GRAFTS ANGIOGRAPHY (N/A, 09/13/2016).  Allergies  Allergen Reactions  . Nsaids Other (See Comments)    Told to avoid all nsaids due to kidney disease   . Tape Other (See Comments)    Welts result, if left for a long amount of time    No current facility-administered medications on file prior to encounter.    Current Outpatient Medications on File Prior to Encounter  Medication Sig  . acetaminophen (TYLENOL) 500 MG tablet Take 1 tablet (500 mg total) by mouth every 6 (six) hours as needed for moderate pain. (Patient taking differently: Take 1,000 mg by mouth every 6 (six) hours as needed (for pain). )  . albuterol (PROVENTIL HFA;VENTOLIN HFA) 108 (90 Base) MCG/ACT inhaler Inhale 1 puff into the lungs every 6 (six) hours as needed for wheezing or shortness of breath. (Patient taking differently: Inhale 2 puffs into the lungs every 8 (eight) hours as needed for wheezing or shortness of breath. )  . albuterol (PROVENTIL) (2.5 MG/3ML) 0.083% nebulizer solution Take 3 mLs (2.5 mg total) by nebulization every 4 (four) hours as needed for wheezing.  Marland Kitchen. aspirin EC 81 MG EC tablet Take 1 tablet (81 mg total) by mouth daily.  Marland Kitchen. atorvastatin (LIPITOR) 80 MG tablet Take 1 tablet (80 mg total) by mouth daily at 6 PM.  . calcitRIOL (ROCALTROL) 0.25 MCG capsule Take 1 capsule (0.25 mcg total) by mouth every Monday, Wednesday, and Friday with hemodialysis. (Patient taking differently: Take 0.25 mcg by mouth See admin instructions. Tuesday Thursday Saturday)  . calcium acetate (PHOSLO) 667 MG capsule Take 2 capsules (1,334 mg total)  by mouth 3 (three) times daily with meals. (Patient  taking differently: Take 1,334-2,001 mg by mouth See admin instructions. Take 2001 mg by mouth 3 times daily with meals and take 1334 mg by mouth with snacks)  . citalopram (CELEXA) 40 MG tablet Take 40 mg by mouth at bedtime.   . clonazePAM (KLONOPIN) 2 MG tablet Take 2 mg by mouth as needed.   . cyclobenzaprine (FLEXERIL) 10 MG tablet Take 1 tablet (10 mg total) 3 times/day as needed-between meals & bedtime by mouth for muscle spasms.  Marland Kitchen gabapentin (NEURONTIN) 100 MG capsule Take 2 capsules (200 mg total) at bedtime by mouth. (Patient taking differently: Take 200 mg by mouth 3 (three) times daily. )  . hydrALAZINE (APRESOLINE) 25 MG tablet Take 1 tablet (25 mg total) by mouth 3 (three) times daily as needed (take for blood pressure above 160 if not controlled with home meds).  Marland Kitchen HYDROcodone-acetaminophen (NORCO/VICODIN) 5-325 MG tablet Take 1-2 tablets by mouth every 4 (four) hours as needed for moderate pain.  Marland Kitchen HYDROmorphone (DILAUDID) 2 MG tablet Take 2 mg by mouth 3 (three) times daily.  . hydrOXYzine (ATARAX/VISTARIL) 25 MG tablet Take 25 mg by mouth 2 (two) times daily as needed for anxiety.   . Immune Globulin, Human, (PRIVIGEN) 40 GM/400ML SOLN Inject 40 g into the vein. 40gm/dayx 5 days and then 40gm q4 weeks  . insulin glargine (LANTUS) 100 UNIT/ML injection Inject 0.3 mLs (30 Units total) into the skin at bedtime. Pens  . insulin lispro (HUMALOG) 100 UNIT/ML injection Inject 0.15-0.3 mLs (15-30 Units total) into the skin every evening. Take one unit per 5 carbs depending on dinner.  . levothyroxine (SYNTHROID, LEVOTHROID) 50 MCG tablet Take 1 tablet (50 mcg total) by mouth daily before breakfast.  . liraglutide (VICTOZA) 18 MG/3ML SOPN Inject 0.3 mLs (1.8 mg total) into the skin daily before breakfast.  . metoCLOPramide (REGLAN) 5 MG tablet Take 1 tablet (5 mg total) by mouth every 8 (eight) hours as needed for nausea or vomiting. (Patient taking differently: Take 10 mg by mouth 3 (three)  times daily. )  . metoprolol tartrate (LOPRESSOR) 100 MG tablet Take 1 tab twice a day on dialysis days and once a day on other days.  . ondansetron (ZOFRAN) 4 MG tablet Take 1 tablet (4 mg total) by mouth 3 (three) times daily as needed for nausea or vomiting.  . cinacalcet (SENSIPAR) 30 MG tablet Take 30 mg by mouth every Monday, Wednesday, and Friday.  . levofloxacin (LEVAQUIN) 500 MG tablet Take 1 daily every other day after dialysis    FAMILY HISTORY:  His indicated that his mother is alive. He indicated that his father is alive. He indicated that his sister is alive. He indicated that his brother is alive. He indicated that his maternal grandmother is alive. He indicated that his maternal grandfather is deceased. He indicated that his paternal grandmother is alive. He indicated that his paternal grandfather is deceased. He indicated that his daughter is alive. He indicated that his son is alive. He indicated that all of his three maternal aunts are alive. He indicated that all of his five maternal uncles are alive. He indicated that both of his paternal aunts are alive. He indicated that all of his three paternal uncles are alive. He indicated that the status of his unknown relative is unknown.   SOCIAL HISTORY: He  reports that  has never smoked. he has never used smokeless tobacco. He reports that he drinks  alcohol. He reports that he does not use drugs.  REVIEW OF SYSTEMS:     SUBJECTIVE:    VITAL SIGNS: BP 117/67 (BP Location: Right Arm)   Pulse 70   Temp 98 F (36.7 C) (Oral)   Resp 18   Ht 6\' 2"  (1.88 m)   Wt 250 lb 0 oz (113.4 kg)   SpO2 98%   BMI 32.10 kg/m   HEMODYNAMICS:    VENTILATOR SETTINGS:    INTAKE / OUTPUT: I/O last 3 completed shifts: In: 1520 [P.O.:1320; IV Piggyback:200] Out: 0   PHYSICAL EXAMINATION: Gen:      No acute distress HEENT:  EOMI, sclera anicteric Neck:     No masses; no thyromegaly Lungs:    Clear to auscultation bilaterally;  normal respiratory effort CV:         Regular rate and rhythm; no murmurs Abd:      + bowel sounds; soft, non-tender; no palpable masses, no distension Ext:    No edema; adequate peripheral perfusion Skin:      Warm and dry; no rash Neuro: alert and oriented x 3 Psych: normal mood and affect  LABS:  BMET Recent Labs  Lab 03/02/17 0510 03/03/17 1041 03/05/17 0528  NA 132* 132* 130*  K 4.0 3.9 4.5  CL 92* 91* 93*  CO2 25 26 21*  BUN 37* 29* 73*  CREATININE 7.88* 6.09* 10.23*  GLUCOSE 256* 232* 249*    Electrolytes Recent Labs  Lab 03/01/17 1502 03/01/17 2049 03/02/17 0510 03/03/17 1041 03/05/17 0528  CALCIUM  --   --  8.9 9.4 9.1  MG 2.1 2.0  --   --   --   PHOS 6.8* 6.2*  --   --   --     CBC Recent Labs  Lab 03/02/17 0510 03/03/17 1041 03/05/17 0528  WBC 7.3 6.7 11.0*  HGB 11.3* 12.3* 10.4*  HCT 34.9* 37.8* 33.0*  PLT 263 145* 259    Coag's No results for input(s): APTT, INR in the last 168 hours.  Sepsis Markers Recent Labs  Lab 03/01/17 1615  LATICACIDVEN 1.75    ABG No results for input(s): PHART, PCO2ART, PO2ART in the last 168 hours.  Liver Enzymes Recent Labs  Lab 03/01/17 1312  AST 21  ALT 14*  ALKPHOS 82  BILITOT 0.8  ALBUMIN 3.7    Cardiac Enzymes No results for input(s): TROPONINI, PROBNP in the last 168 hours.  Glucose Recent Labs  Lab 03/03/17 2122 03/04/17 0737 03/04/17 1153 03/04/17 1705 03/04/17 2110 03/05/17 1301  GLUCAP 207* 166* 185* 191* 188* 164*    Imaging CT scan 10/14/16-no pulmonary embolism, no lung abnormalities, mild mediastinal lymphadenopathy CT scan 02/22/17-3 cm consolidative mass in the right upper lobe with air bronchograms.  Mild improvement in mediastinal lymph nodes CT scan 03/01/17- right upper lobe mass minimally changed in size.  Borderline mediastinal lymph nodes I have reviewed the images personally  CULTURES: Bcx- Unable to obtain  ANTIBIOTICS: Vanco 2/28 > 3/2 Cefepime 2/28  >> Doxy 3/2 >>  DISCUSSION: 47 year old seen in follow-up for right lung consolidative mass, enlarged mediastinal LNs CT images shows consolidation with air bronchograms which favours inflammatory, infectious process His repeat CT on 2/28 (1 week from last scan) shows subtle improvement in size and density which is indicative of improving consolidation. Mediastinal LNs are also smaller in size. Suspicion for lung cancer is low as he is a non-smoker.   Now undergoing eval for CAD and new reduction on EF  with cath planned for tomorrow. We have been asked to comment on timing of stent placement and antiplatelet therapy in light of possible need for lung biopsy in the future.   Reccs Continue antibiotics per primary team If needed he should receive a cardiac stent as suspicion for lung cancer is low and even if he needs a EBUS or navigational biopsy it would need to be done under general anesthesia. This cannot be done until his heart is revascularized.   Scheduled for PET CT as outpatient. Will follow up in clinic. Please call with any questions during this admission.  Discussed with patient, Dr. Melburn Popper, Cardiology and Dr. Sunnie Nielsen, Hop[facility] team  Chilton Greathouse MD [facility] Pager [phone] If no answer or after 3pm call: [phone] 03/05/2017, 2:14 PM

## 2017-03-05 NOTE — Progress Notes (Signed)
[facility] Progress Note  Subjective: no new c/o  Vitals:   03/05/17 0830 03/05/17 0900 03/05/17 0930 03/05/17 1305  BP: 108/64 (!) 97/54 (!) 105/47 117/67  Pulse: 63 71 69 70  Resp: 18 18 18 18   Temp:    98 F (36.7 C)  TempSrc:    Oral  SpO2:    98%  Weight:      Height:        Inpatient medications: . aspirin EC  81 mg Oral Daily  . atorvastatin  80 mg Oral q1800  . calcitRIOL  1.25 mcg Oral Q M,W,F-HD  . calcium acetate  2,001 mg Oral TID WC  . cinacalcet  30 mg Oral Q M,W,F  . citalopram  40 mg Oral QHS  . dextromethorphan-guaiFENesin  1 tablet Oral BID  . doxycycline  100 mg Oral Q12H  . fluticasone  1 spray Each Nare Daily  . gabapentin  200 mg Oral TID  . heparin  5,000 Units Subcutaneous Q8H  . insulin aspart  0-5 Units Subcutaneous QHS  . insulin aspart  0-9 Units Subcutaneous TID WC  . insulin glargine  20 Units Subcutaneous QHS  . levothyroxine  50 mcg Oral QAC breakfast  . metoprolol succinate  100 mg Oral BID  . pantoprazole  40 mg Oral BID   . ceFEPime (MAXIPIME) IV Stopped (03/04/17 1822)  . [START ON 03/07/2017] ferric gluconate (FERRLECIT/NULECIT) IV     acetaminophen, calcium acetate, clonazePAM, HYDROmorphone, hydrOXYzine, ipratropium-albuterol, metoCLOPramide, ondansetron (ZOFRAN) IV  Exam: General: Obese male, NAD Heart: RRR  Lungs: CTAB Abdomen: soft NT Extremities: Trace LE edema  Dialysis Access: LUE AVF +bruit   Dialysis: [facility] MWF 4h 15min   112kg  2/2.25 bath  Hep 8000 +2000 midrun  LFA AVF -Calcitriol 1.4825mcg po/HD -Mircera 50 q 2wks (last on 02/28/17) -Venofer 50mg  q wkly   Assessment: 1. PNA/lung mass/dyspnea- Transitioned to PO Doxy for PNA/Lung mass on CT - F/u for outpatient PET scan in 4-5 weeks. Echo pending  2. ESRD -HD MWF. Cont on schedule. Next HD 3/4  3. Hypertension/volume - BP ok, still up 6kg 4. Anemia - hgb 12.3 No esa needed currently Follow trend  5. Metabolic bone disease  -Po vit d , binders , (no parsbiv in hosp ) 6. Nutrition -Carb /mod renal diet  7. DM type 2 - per admit  8. Depression / Anxiety - On citalopram  9. Gastroparesis - TID reglan , per admit 10. CM EF 25-30% - new issue, cards evaluating   Plan - HD today, max UF   Vinson Moselleob Sharise Lippy MD [facility] pager [phone]   03/05/2017, 1:08 PM   Recent Labs  Lab 03/01/17 1502 03/01/17 2049 03/02/17 0510 03/03/17 1041 03/05/17 0528  NA  --   --  132* 132* 130*  K  --   --  4.0 3.9 4.5  CL  --   --  92* 91* 93*  CO2  --   --  25 26 21*  GLUCOSE  --   --  256* 232* 249*  BUN  --   --  37* 29* 73*  CREATININE  --  7.15* 7.88* 6.09* 10.23*  CALCIUM  --   --  8.9 9.4 9.1  PHOS 6.8* 6.2*  --   --   --    Recent Labs  Lab 03/01/17 1312  AST 21  ALT 14*  ALKPHOS 82  BILITOT 0.8  PROT 7.9  ALBUMIN 3.7   Recent  Labs  Lab 03/02/17 0510 03/03/17 1041 03/05/17 0528  WBC 7.3 6.7 11.0*  NEUTROABS 5.4  --   --   HGB 11.3* 12.3* 10.4*  HCT 34.9* 37.8* 33.0*  MCV 90.4 91.3 92.7  PLT 263 145* 259   Iron/TIBC/Ferritin/ %Sat    Component Value Date/Time   IRON 17 (L) 05/17/2016 0711   TIBC 216 (L) 05/17/2016 0711   FERRITIN 139 05/17/2016 0711   IRONPCTSAT 8 (L) 05/17/2016 1610

## 2017-03-05 NOTE — Progress Notes (Signed)
Progress Note  Patient Name: Russell [name] Date of Encounter: 03/05/2017  Primary Cardiologist: Jens Som  Subjective   Russell [name] is a 47 y.o. male with a hx of ESRD, CAD with prior CABG, HTN, DM2,  who is being seen today for the evaluation of SOB at the request of Dr Sunnie Nielsen  EF has dropped since fall of last year    Inpatient Medications    Scheduled Meds: . aspirin EC  81 mg Oral Daily  . atorvastatin  80 mg Oral q1800  . calcitRIOL  1.25 mcg Oral Q M,W,F-HD  . calcium acetate  2,001 mg Oral TID WC  . cinacalcet  30 mg Oral Q M,W,F  . citalopram  40 mg Oral QHS  . dextromethorphan-guaiFENesin  1 tablet Oral BID  . doxycycline  100 mg Oral Q12H  . fluticasone  1 spray Each Nare Daily  . gabapentin  200 mg Oral TID  . heparin  5,000 Units Subcutaneous Q8H  . insulin aspart  0-5 Units Subcutaneous QHS  . insulin aspart  0-9 Units Subcutaneous TID WC  . insulin glargine  20 Units Subcutaneous QHS  . levothyroxine  50 mcg Oral QAC breakfast  . metoprolol succinate  100 mg Oral BID  . pantoprazole  40 mg Oral BID   Continuous Infusions: . sodium chloride    . sodium chloride    . ceFEPime (MAXIPIME) IV Stopped (03/04/17 1822)  . [START ON 03/07/2017] ferric gluconate (FERRLECIT/NULECIT) IV     PRN Meds: sodium chloride, sodium chloride, acetaminophen, calcium acetate, clonazePAM, heparin, HYDROmorphone, hydrOXYzine, ipratropium-albuterol, lidocaine (PF), lidocaine-prilocaine, metoCLOPramide, ondansetron (ZOFRAN) IV, pentafluoroprop-tetrafluoroeth   Vital Signs    Vitals:   03/05/17 0800 03/05/17 0830 03/05/17 0900 03/05/17 0930  BP: 131/69 108/64 (!) 97/54 (!) 105/47  Pulse: 72 63 71 69  Resp: 18 18 18 18   Temp:      TempSrc:      SpO2:      Weight:      Height:        Intake/Output Summary (Last 24 hours) at 03/05/2017 1149 Last data filed at 03/05/2017 0600 Gross per 24 hour  Intake 920 ml  Output 0 ml  Net 920 ml   Filed Weights   03/03/17 2126  03/04/17 2114 03/05/17 0640  Weight: 253 lb 12 oz (115.1 kg) 259 lb 12.8 oz (117.8 kg) 261 lb 0.4 oz (118.4 kg)    Telemetry     - Personally Reviewed  ECG      Physical Exam   GEN:  young male, No acute distress.  examinded after dialysis  Neck: No JVD Cardiac: RRR, no murmurs, rubs, or gallops.  Respiratory: Clear to auscultation bilaterally. GI: Soft, nontender, non-distended  MS: No edema; No deformity. Neuro:  Nonfocal  Psych: Normal affect   Labs    Chemistry Recent Labs  Lab 03/01/17 1312  03/02/17 0510 03/03/17 1041 03/05/17 0528  NA 131*  --  132* 132* 130*  K 4.2  --  4.0 3.9 4.5  CL 88*  --  92* 91* 93*  CO2 25  --  25 26 21*  GLUCOSE 337*  --  256* 232* 249*  BUN 29*  --  37* 29* 73*  CREATININE 6.90*   < > 7.88* 6.09* 10.23*  CALCIUM 9.3  --  8.9 9.4 9.1  PROT 7.9  --   --   --   --   ALBUMIN 3.7  --   --   --   --  AST 21  --   --   --   --   ALT 14*  --   --   --   --   ALKPHOS 82  --   --   --   --   BILITOT 0.8  --   --   --   --   GFRNONAA 9*   < > 7* 10* 5*  GFRAA 10*   < > 8* 12* 6*  ANIONGAP 18*  --  15 15 16*   < > = values in this interval not displayed.     Hematology Recent Labs  Lab 03/02/17 0510 03/03/17 1041 03/05/17 0528  WBC 7.3 6.7 11.0*  RBC 3.86* 4.14* 3.56*  HGB 11.3* 12.3* 10.4*  HCT 34.9* 37.8* 33.0*  MCV 90.4 91.3 92.7  MCH 29.3 29.7 29.2  MCHC 32.4 32.5 31.5  RDW 15.7* 15.8* 15.7*  PLT 263 145* 259    Cardiac EnzymesNo results for input(s): TROPONINI in the last 168 hours.  Recent Labs  Lab 03/01/17 1355 03/01/17 1613  TROPIPOC 0.04 0.04     BNPNo results for input(s): BNP, PROBNP in the last 168 hours.   DDimer No results for input(s): DDIMER in the last 168 hours.   Radiology    No results found.  Cardiac Studies      Patient Profile     47 y.o. male   Assessment & Plan    1.  Acute systolic CHF:   EF has fallen since last OCT.  Agree with plans for cath  Have discussed risks,  benefits, options.  He understands and agrees to proceed Schedule for cath tomorrow Orders written     2.  CAD - s/p CABG  3.  HTN:   Well contolled.  4. ESRD:   Dialysis  For questions or updates, please contact [facility] Please consult [email or web address] for contact info under Cardiology/STEMI.      Signed, Kristeen MissPhilip Nahser, MD  03/05/2017, 11:49 AM

## 2017-03-05 NOTE — Progress Notes (Signed)
PROGRESS NOTE    Russell [name]  ZOX:[id number] DOB: 1970-12-10 DOA: 03/01/2017 PCP: [name], MD   Brief Narrative: Russell [name] is a 47 y.o. male  With pmhx significant for anxiety, asthma, CAD, DVT, depression, gastroparesis, DM II who presents with sob. Pt with sob and ill feeling for greater than 2 weeks. No fever or cough. Had been seen by his MD. Russell [name] for f/u today. Told to start levaquin. Got worse and went to ED for eval.   ED Course: Started on Vanc and cefepime. Blood cultures missed. CTA showed pna. Hospitalists consulted for admit.  Assessment & Plan:   Active Problems:   Hypothyroidism (acquired)   Diabetic neuropathy (HCC)   Uncontrolled diabetes mellitus type 2 with peripheral artery disease (HCC)   Morbid obesity (HCC)   Essential hypertension, benign   ESRD on dialysis (HCC)   History of DVT of lower extremity   Quadriparesis (HCC)   CIDP (chronic inflammatory demyelinating polyneuropathy) (HCC)   CAP (community acquired pneumonia)  1-PNA; lung mass;  CT with persistent PNA since 7 days ago.  Continue with cefepime.  Strept pneumonia ordered.  Schedule Nebulizer.  HIV negative.  Of note patient was evaluated by Dr Russell [name], pulmonologist 2-28 for lung mass, patient received one dose of vancomycin and Fortaz during HD but patient refuse IV antibiotics. Dr Russell [name] prescribe him Levaquin for 14 days. Patient came to ED complaining of SOB.  Patient will need PET scan in 4-5 weeks.  Doxy to cover atypical.  Stop vancomycin, MRSA PCR negative.  Speech evaluation, recommend esophageal dysphagia evaluation. Esophagogram ordered.  Report improvement of dyspnea.  Pulmonologist consulted for strategic for care of lung mass and patient needing Cath.   Chest pain, Dyspnea; suspect related to PNA.  CT angio negative for pe.  Troponin times 2 negative.  ECHO with worsening EF 25-30 % prior at 65 %. had Cath 9 / 2018; 1. Severe stenosis mid LAD. Patent LIMA graft to  mid LAD. 2. Severe stenosis moderate caliber first diagonal branch. Patent vein graft to the Diagonal branch. Severe stenosis large caliber intermediate branch. Patent vein graft to the intermediate branch. Patent Circumflex artery. Patent RCA Cardiology consulted. Cath planned for Tuesday. Pulmonology consulted for evaluation of lung mass. Cardiology was concern with interruption of anticoagulation in the event that patient  need lung Biopsy.   History of CABG;  Continue with aspirin, Lipitor, metoprolol./   HTN; Continue with Hydralazine, metoprolol.   DM type II;  Continue with lantus and SSI.   HLD; statins.   Depression/Anxiety Cont celexa No SI/HI Prn klonopin & atarax  Hypothyroidism; continue with synthroid.   CIDP, also DM neuropathy; follows with Dr Russell [name]. He gets IVIG.  Will order PT , OT   Gastroparesis; reglan change to TID PRN.  Nausea, after coughing. PRN phenergan.   Chronic pain; change dilaudid to PRN.   Prolong QT;  Change azithro to doxy  Improved to 480.   DVT prophylaxis: Heparin.  Code Status: full code.  Family Communication: care discussed with patient.  Disposition Plan: home when stable.   Consultants:   Nephrology    Procedures: ECHO Ef 25--35 %   Antimicrobials: cefepime 2-28 Vancomycin 2-28  Subjective: He report improvement of dyspnea.  Seeing during HD  Objective: Vitals:   03/05/17 0730 03/05/17 0800 03/05/17 0830 03/05/17 0900  BP: 110/66 131/69 108/64 (!) 97/54  Pulse: 68 72 63 71  Resp: 18 18 18 18   Temp:  TempSrc:      SpO2:      Weight:      Height:        Intake/Output Summary (Last 24 hours) at 03/05/2017 0934 Last data filed at 03/05/2017 0600 Gross per 24 hour  Intake 920 ml  Output 0 ml  Net 920 ml   Filed Weights   03/03/17 2126 03/04/17 2114 03/05/17 0640  Weight: 115.1 kg (253 lb 12 oz) 117.8 kg (259 lb 12.8 oz) 118.4 kg (261 lb 0.4 oz)    Examination:  General exam: NAD Respiratory system:  Normal respiratory effort, crackles bases.  Cardiovascular system: S 1, S 2 RRR Gastrointestinal system: BS present, soft, nt Central nervous system:  Quadriparesis.  Extremities: no edema Skin; No rash.     Data Reviewed: I have personally reviewed following labs and imaging studies  CBC: Recent Labs  Lab 03/01/17 1312 03/01/17 2049 03/02/17 0510 03/03/17 1041 03/05/17 0528  WBC 8.2 10.0 7.3 6.7 11.0*  NEUTROABS  --   --  5.4  --   --   HGB 12.7* 12.7* 11.3* 12.3* 10.4*  HCT 38.5* 38.1* 34.9* 37.8* 33.0*  MCV 88.5 88.4 90.4 91.3 92.7  PLT 273 282 263 145* 259   Basic Metabolic Panel: Recent Labs  Lab 03/01/17 1312 03/01/17 1502 03/01/17 2049 03/02/17 0510 03/03/17 1041 03/05/17 0528  NA 131*  --   --  132* 132* 130*  K 4.2  --   --  4.0 3.9 4.5  CL 88*  --   --  92* 91* 93*  CO2 25  --   --  25 26 21*  GLUCOSE 337*  --   --  256* 232* 249*  BUN 29*  --   --  37* 29* 73*  CREATININE 6.90*  --  7.15* 7.88* 6.09* 10.23*  CALCIUM 9.3  --   --  8.9 9.4 9.1  MG  --  2.1 2.0  --   --   --   PHOS  --  6.8* 6.2*  --   --   --    GFR: Estimated Creatinine Clearance: 12.3 mL/min (A) (by C-G formula based on SCr of 10.23 mg/dL (H)). Liver Function Tests: Recent Labs  Lab 03/01/17 1312  AST 21  ALT 14*  ALKPHOS 82  BILITOT 0.8  PROT 7.9  ALBUMIN 3.7   Recent Labs  Lab 03/01/17 1312  LIPASE 45   No results for input(s): AMMONIA in the last 168 hours. Coagulation Profile: No results for input(s): INR, PROTIME in the last 168 hours. Cardiac Enzymes: No results for input(s): CKTOTAL, CKMB, CKMBINDEX, TROPONINI in the last 168 hours. BNP (last 3 results) No results for input(s): PROBNP in the last 8760 hours. HbA1C: No results for input(s): HGBA1C in the last 72 hours. CBG: Recent Labs  Lab 03/03/17 2122 03/04/17 0737 03/04/17 1153 03/04/17 1705 03/04/17 2110  GLUCAP 207* 166* 185* 191* 188*   Lipid Profile: No results for input(s): CHOL, HDL,  LDLCALC, TRIG, CHOLHDL, LDLDIRECT in the last 72 hours. Thyroid Function Tests: No results for input(s): TSH, T4TOTAL, FREET4, T3FREE, THYROIDAB in the last 72 hours. Anemia Panel: No results for input(s): VITAMINB12, FOLATE, FERRITIN, TIBC, IRON, RETICCTPCT in the last 72 hours. Sepsis Labs: Recent Labs  Lab 03/01/17 1615  LATICACIDVEN 1.75    Recent Results (from the past 240 hour(s))  MRSA PCR Screening     Status: None   Collection Time: 03/01/17 11:30 PM  Result Value Ref Range Status  MRSA by PCR NEGATIVE NEGATIVE Final    Comment:        The GeneXpert MRSA Assay (FDA approved for NASAL specimens only), is one component of a comprehensive MRSA colonization surveillance program. It is not intended to diagnose MRSA infection nor to guide or monitor treatment for MRSA infections. Performed at [facility], [address] [place], [place] [address]          Radiology Studies: No results found.      Scheduled Meds: . aspirin EC  81 mg Oral Daily  . atorvastatin  80 mg Oral q1800  . calcitRIOL  1.25 mcg Oral Q M,W,F-HD  . calcium acetate  2,001 mg Oral TID WC  . cinacalcet  30 mg Oral Q M,W,F  . citalopram  40 mg Oral QHS  . dextromethorphan-guaiFENesin  1 tablet Oral BID  . doxycycline  100 mg Oral Q12H  . fluticasone  1 spray Each Nare Daily  . gabapentin  200 mg Oral TID  . heparin  5,000 Units Subcutaneous Q8H  . insulin aspart  0-5 Units Subcutaneous QHS  . insulin aspart  0-9 Units Subcutaneous TID WC  . insulin glargine  20 Units Subcutaneous QHS  . levothyroxine  50 mcg Oral QAC breakfast  . metoprolol succinate  100 mg Oral BID  . pantoprazole  40 mg Oral BID   Continuous Infusions: . sodium chloride    . sodium chloride    . ceFEPime (MAXIPIME) IV Stopped (03/04/17 1822)  . [START ON 03/07/2017] ferric gluconate (FERRLECIT/NULECIT) IV       LOS: 3 days    Time spent:35 minutes     Alba Cory, MD [facility] Pager [phone]  If 7PM-7AM, please contact night-coverage [email or web address] Password [facility] 03/05/2017, 9:34 AM

## 2017-03-06 ENCOUNTER — Encounter (HOSPITAL_COMMUNITY)
Admission: EM | Disposition: A | Discharge status: Home / Self Care | Payer: Self-pay | Source: Home / Self Care | Attending: Internal Medicine

## 2017-03-06 DIAGNOSIS — I25119 Atherosclerotic heart disease of native coronary artery with unspecified angina pectoris: Secondary | ICD-10-CM

## 2017-03-06 HISTORY — PX: RIGHT/LEFT HEART CATH AND CORONARY/GRAFT ANGIOGRAPHY: CATH118267

## 2017-03-06 LAB — GLUCOSE, CAPILLARY
GLUCOSE-CAPILLARY: 132 mg/dL — AB (ref 65–99)
Glucose-Capillary: 167 mg/dL — ABNORMAL HIGH (ref 65–99)
Glucose-Capillary: 238 mg/dL — ABNORMAL HIGH (ref 65–99)
Glucose-Capillary: 183 mg/dL — ABNORMAL HIGH (ref 65–99)

## 2017-03-06 LAB — CBC
HCT: 32.3 % — ABNORMAL LOW (ref 39.0–52.0)
HEMOGLOBIN: 10 g/dL — AB (ref 13.0–17.0)
MCH: 28.8 pg (ref 26.0–34.0)
MCHC: 31 g/dL (ref 30.0–36.0)
MCV: 93.1 fL (ref 78.0–100.0)
PLATELETS: 272 10*3/uL (ref 150–400)
RBC: 3.47 MIL/uL — AB (ref 4.22–5.81)
RDW: 15.8 % — ABNORMAL HIGH (ref 11.5–15.5)
WBC: 7.6 10*3/uL (ref 4.0–10.5)

## 2017-03-06 LAB — POCT I-STAT 3, VENOUS BLOOD GAS (G3P V)
ACID-BASE EXCESS: 1 mmol/L (ref 0.0–2.0)
Acid-Base Excess: 1 mmol/L (ref 0.0–2.0)
Bicarbonate: 27.9 mmol/L (ref 20.0–28.0)
Bicarbonate: 28.4 mmol/L — ABNORMAL HIGH (ref 20.0–28.0)
O2 SAT: 67 %
O2 Saturation: 66 %
PH VEN: 7.305 (ref 7.250–7.430)
PH VEN: 7.31 (ref 7.250–7.430)
TCO2: 30 mmol/L (ref 22–32)
TCO2: 30 mmol/L (ref 22–32)
pCO2, Ven: 56.1 mmHg (ref 44.0–60.0)
pCO2, Ven: 56.4 mmHg (ref 44.0–60.0)
pO2, Ven: 38 mmHg (ref 32.0–45.0)
pO2, Ven: 39 mmHg (ref 32.0–45.0)

## 2017-03-06 LAB — POCT I-STAT 3, ART BLOOD GAS (G3+)
Bicarbonate: 26.8 mmol/L (ref 20.0–28.0)
O2 SAT: 99 %
PCO2 ART: 50.4 mmHg — AB (ref 32.0–48.0)
PO2 ART: 174 mmHg — AB (ref 83.0–108.0)
TCO2: 28 mmol/L (ref 22–32)
pH, Arterial: 7.334 — ABNORMAL LOW (ref 7.350–7.450)

## 2017-03-06 LAB — CREATININE, SERUM
CREATININE: 8.36 mg/dL — AB (ref 0.61–1.24)
GFR calc non Af Amer: 7 mL/min — ABNORMAL LOW (ref >60)
GFR, EST AFRICAN AMERICAN: 8 mL/min — AB (ref >60)

## 2017-03-06 LAB — PROTIME-INR
INR: 1.18
Prothrombin Time: 14.9 seconds (ref 11.4–15.2)

## 2017-03-06 SURGERY — RIGHT/LEFT HEART CATH AND CORONARY/GRAFT ANGIOGRAPHY
Anesthesia: LOCAL

## 2017-03-06 MED ORDER — FENTANYL CITRATE (PF) 100 MCG/2ML IJ SOLN
INTRAMUSCULAR | Status: AC
  Filled 2017-03-06: qty 2

## 2017-03-06 MED ORDER — SODIUM CHLORIDE 0.9 % IV SOLN
250.0000 mL | INTRAVENOUS | Status: DC | PRN
Start: 2017-03-06 — End: 2017-03-06

## 2017-03-06 MED ORDER — SODIUM CHLORIDE 0.9% FLUSH: 3.0000 mL | INTRAVENOUS | Status: DC | PRN

## 2017-03-06 MED ORDER — LIDOCAINE HCL (PF) 1 % IJ SOLN
INTRAMUSCULAR | Status: DC | PRN
  Administered 2017-03-06: 20 mL

## 2017-03-06 MED ORDER — SODIUM CHLORIDE 0.9 % IV SOLN: INTRAVENOUS | Status: DC

## 2017-03-06 MED ORDER — ASPIRIN 81 MG PO CHEW
81.0000 mg | CHEWABLE_TABLET | ORAL | Status: AC
  Administered 2017-03-06: 81 mg via ORAL
  Filled 2017-03-06: qty 1

## 2017-03-06 MED ORDER — LIDOCAINE HCL (PF) 1 % IJ SOLN
INTRAMUSCULAR | Status: AC
  Filled 2017-03-06: qty 30

## 2017-03-06 MED ORDER — MIDAZOLAM HCL 2 MG/2ML IJ SOLN
INTRAMUSCULAR | Status: AC
  Filled 2017-03-06: qty 2

## 2017-03-06 MED ORDER — SODIUM CHLORIDE 0.9 % IV SOLN: 250.0000 mL | INTRAVENOUS | Status: DC | PRN

## 2017-03-06 MED ORDER — HEPARIN (PORCINE) IN NACL 2-0.9 UNIT/ML-% IJ SOLN
INTRAMUSCULAR | Status: AC
  Filled 2017-03-06: qty 1000

## 2017-03-06 MED ORDER — MIDAZOLAM HCL 2 MG/2ML IJ SOLN
INTRAMUSCULAR | Status: DC | PRN
  Administered 2017-03-06: 1 mg via INTRAVENOUS

## 2017-03-06 MED ORDER — IOPAMIDOL (ISOVUE-370) INJECTION 76%
INTRAVENOUS | Status: AC
  Filled 2017-03-06: qty 125

## 2017-03-06 MED ORDER — FENTANYL CITRATE (PF) 100 MCG/2ML IJ SOLN
INTRAMUSCULAR | Status: DC | PRN
  Administered 2017-03-06: 25 ug via INTRAVENOUS

## 2017-03-06 MED ORDER — HYDRALAZINE HCL 25 MG PO TABS: 25.0000 mg | ORAL_TABLET | Freq: Three times a day (TID) | ORAL | Status: DC | PRN

## 2017-03-06 MED ORDER — ACETAMINOPHEN 325 MG PO TABS
ORAL_TABLET | ORAL | Status: AC
  Filled 2017-03-06: qty 2

## 2017-03-06 MED ORDER — IOPAMIDOL (ISOVUE-370) INJECTION 76%
INTRAVENOUS | Status: DC | PRN
  Administered 2017-03-06: 95 mL via INTRA_ARTERIAL

## 2017-03-06 MED ORDER — HEPARIN (PORCINE) IN NACL 2-0.9 UNIT/ML-% IJ SOLN
INTRAMUSCULAR | Status: AC | PRN
  Administered 2017-03-06 (×2): 500 mL via INTRA_ARTERIAL

## 2017-03-06 MED ORDER — SODIUM CHLORIDE 0.9% FLUSH
3.0000 mL | Freq: Two times a day (BID) | INTRAVENOUS | Status: DC
  Administered 2017-03-07 – 2017-03-13 (×8): 3 mL via INTRAVENOUS

## 2017-03-06 MED ORDER — SODIUM CHLORIDE 0.9% FLUSH: 3.0000 mL | Freq: Two times a day (BID) | INTRAVENOUS | Status: DC

## 2017-03-06 MED ORDER — SODIUM CHLORIDE 0.9% FLUSH
3.0000 mL | INTRAVENOUS | Status: DC | PRN
Start: 2017-03-06 — End: 2017-03-06

## 2017-03-06 MED ORDER — ASPIRIN 81 MG PO CHEW: 81.0000 mg | CHEWABLE_TABLET | ORAL | Status: DC

## 2017-03-06 SURGICAL SUPPLY — 11 items
CATH INFINITI 5 FR IM (CATHETERS) ×1 IMPLANT
CATH INFINITI 5FR MULTPACK ANG (CATHETERS) ×1 IMPLANT
CATH SWAN GANZ 7F STRAIGHT (CATHETERS) ×1 IMPLANT
GUIDEWIRE INQWIRE 1.5J.035X260 (WIRE) IMPLANT
INQWIRE 1.5J .035X260CM (WIRE) ×2
KIT HEART LEFT (KITS) ×2 IMPLANT
PACK CARDIAC CATHETERIZATION (CUSTOM PROCEDURE TRAY) ×2 IMPLANT
SHEATH PINNACLE 5F 10CM (SHEATH) ×1 IMPLANT
SHEATH PINNACLE 7F 10CM (SHEATH) ×1 IMPLANT
TRANSDUCER W/STOPCOCK (MISCELLANEOUS) ×2 IMPLANT
WIRE EMERALD 3MM-J .035X150CM (WIRE) ×1 IMPLANT

## 2017-03-06 NOTE — Progress Notes (Addendum)
Progress Note  Patient Name: Russell [name] Date of Encounter: 03/06/2017  Primary Cardiologist: Jens Som  Subjective   Russell [name] is a 47 y.o. male with a hx of ESRD, CAD with prior CABG, HTN, DM2,  who is being seen today for the evaluation of SOB at the request of Dr Sunnie Nielsen  EF has dropped since fall of last year  Scheduled for cath today   Inpatient Medications    Scheduled Meds: . aspirin EC  81 mg Oral Daily  . atorvastatin  80 mg Oral q1800  . calcitRIOL  1.25 mcg Oral Q M,W,F-HD  . calcium acetate  2,001 mg Oral TID WC  . cinacalcet  30 mg Oral Q M,W,F  . citalopram  40 mg Oral QHS  . dextromethorphan-guaiFENesin  1 tablet Oral BID  . doxycycline  100 mg Oral Q12H  . fluticasone  1 spray Each Nare Daily  . gabapentin  200 mg Oral TID  . heparin  5,000 Units Subcutaneous Q8H  . insulin aspart  0-5 Units Subcutaneous QHS  . insulin aspart  0-9 Units Subcutaneous TID WC  . insulin glargine  20 Units Subcutaneous QHS  . levothyroxine  50 mcg Oral QAC breakfast  . metoprolol succinate  100 mg Oral BID  . pantoprazole  40 mg Oral BID  . sodium chloride flush  3 mL Intravenous Q12H   Continuous Infusions: . sodium chloride    . sodium chloride    . ceFEPime (MAXIPIME) IV Stopped (03/05/17 1800)  . [START ON 03/07/2017] ferric gluconate (FERRLECIT/NULECIT) IV     PRN Meds: sodium chloride, acetaminophen, calcium acetate, clonazePAM, HYDROmorphone, hydrOXYzine, ipratropium-albuterol, metoCLOPramide, ondansetron (ZOFRAN) IV, sodium chloride flush   Vital Signs    Vitals:   03/05/17 2123 03/06/17 0427 03/06/17 0500 03/06/17 0823  BP: (!) 150/94 123/89  134/82  Pulse: 75 70  75  Resp: 18 17  17   Temp: 97.6 F (36.4 C) 97.8 F (36.6 C)  98 F (36.7 C)  TempSrc: Oral Oral  Oral  SpO2: 100% 100%  98%  Weight:   257 lb 15 oz (117 kg)   Height:        Intake/Output Summary (Last 24 hours) at 03/06/2017 0902 Last data filed at 03/06/2017 0601 Gross per 24  hour  Intake 100 ml  Output 0 ml  Net 100 ml   Filed Weights   03/05/17 0640 03/05/17 1145 03/06/17 0500  Weight: 261 lb 0.4 oz (118.4 kg) 250 lb 0 oz (113.4 kg) 257 lb 15 oz (117 kg)    Telemetry     - Personally Reviewed  ECG      Physical Exam   Physical Exam: Blood pressure 134/82, pulse 75, temperature 98 F (36.7 C), temperature source Oral, resp. rate 17, height 6\' 2"  (1.88 m), weight 257 lb 15 oz (117 kg), SpO2 98 %.  GEN:  Young , obese male, NAD  HEENT: Normal NECK: No JVD; No carotid bruits LYMPHATICS: No lymphadenopathy CARDIAC: RRR  RESPIRATORY:  Clear to auscultation without rales, wheezing or rhonchi  ABDOMEN: obese  MUSCULOSKELETAL:  No edema; No deformity  SKIN: Warm and dry NEUROLOGIC:  Alert and oriented x 3   Labs    Chemistry Recent Labs  Lab 03/01/17 1312  03/02/17 0510 03/03/17 1041 03/05/17 0528  NA 131*  --  132* 132* 130*  K 4.2  --  4.0 3.9 4.5  CL 88*  --  92* 91* 93*  CO2 25  --  25 26 21*  GLUCOSE 337*  --  256* 232* 249*  BUN 29*  --  37* 29* 73*  CREATININE 6.90*   < > 7.88* 6.09* 10.23*  CALCIUM 9.3  --  8.9 9.4 9.1  PROT 7.9  --   --   --   --   ALBUMIN 3.7  --   --   --   --   AST 21  --   --   --   --   ALT 14*  --   --   --   --   ALKPHOS 82  --   --   --   --   BILITOT 0.8  --   --   --   --   GFRNONAA 9*   < > 7* 10* 5*  GFRAA 10*   < > 8* 12* 6*  ANIONGAP 18*  --  15 15 16*   < > = values in this interval not displayed.     Hematology Recent Labs  Lab 03/02/17 0510 03/03/17 1041 03/05/17 0528  WBC 7.3 6.7 11.0*  RBC 3.86* 4.14* 3.56*  HGB 11.3* 12.3* 10.4*  HCT 34.9* 37.8* 33.0*  MCV 90.4 91.3 92.7  MCH 29.3 29.7 29.2  MCHC 32.4 32.5 31.5  RDW 15.7* 15.8* 15.7*  PLT 263 145* 259    Cardiac EnzymesNo results for input(s): TROPONINI in the last 168 hours.  Recent Labs  Lab 03/01/17 1355 03/01/17 1613  TROPIPOC 0.04 0.04     BNPNo results for input(s): BNP, PROBNP in the last 168 hours.    DDimer No results for input(s): DDIMER in the last 168 hours.   Radiology    No results found.  Cardiac Studies      Patient Profile     47 y.o. male   Assessment & Plan    1.  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 Scheduled for right and left heart cath , cors, grafts, possible PCI  Discussed risks, benefits, optoins.   He understands and agrees to proceed.    2.  CAD - s/p CABG, no angina   3.  HTN:   Remains well controlled.   4. ESRD:   Dialysis   For questions or updates, please contact [facility] Please consult [email or web address] for contact info under Cardiology/STEMI.      Signed, Kristeen MissPhilip Nahser, MD  03/06/2017, 9:02 AM

## 2017-03-06 NOTE — Progress Notes (Signed)
PROGRESS NOTE    Russell [name]  WUJ:[id number] DOB: 04/25/1970 DOA: 03/01/2017 PCP: [name], MD   Brief Narrative: Russell [name] is a 47 y.o. male  With pmhx significant for anxiety, asthma, CAD, DVT, depression, gastroparesis, DM II, CIDP,  who presents with sob. Pt with sob and ill feeling for greater than 2 weeks. No fever or cough. Had been seen by his MD. Russell [name] for f/u today. Told to start levaquin. Got worse and went to ED for eval.   ED Course: Started on Vanc and cefepime. Blood cultures missed. CTA showed pna. Hospitalists consulted for admit. Patient during evaluation for dyspnea was found to have Ef reduce to 30%. Cardiology was consulted and recommended cath. Cardiac cath showed stable CA, suspect reduce EF is related to volume-macrovascular ischemia.   Also awaiting esophagogram, which has been ordered  to evaluate for esophageal dysphagia in setting of PNA.  Assessment & Plan:   Active Problems:   Hypothyroidism (acquired)   Diabetic neuropathy (HCC)   Uncontrolled diabetes mellitus type 2 with peripheral artery disease (HCC)   Morbid obesity (HCC)   Essential hypertension, benign   ESRD on dialysis (HCC)   History of DVT of lower extremity   Quadriparesis (HCC)   CIDP (chronic inflammatory demyelinating polyneuropathy) (HCC)   CAP (community acquired pneumonia)  1-PNA; lung mass;  CT with persistent PNA since 7 days ago.  Continue with cefepime.  Schedule Nebulizer.  HIV negative.  Of note patient was evaluated by Dr Isaiah Serge, pulmonologist 2-28 for lung mass, patient received one dose of vancomycin and Fortaz during HD but patient refuse IV antibiotics. Dr Isaiah Serge prescribe him Levaquin for 14 days. Patient came to ED complaining of SOB, day after evaluated by pulmonologist.  Patient will need PET scan in 4-5 weeks.  Doxy to cover atypical.  Stop vancomycin, MRSA PCR negative.  Speech evaluation, recommend esophageal dysphagia evaluation. Esophagogram ordered.   Report improvement of dyspnea.  Per pulmonologist ok to proceed with stent if needed.   Chest pain, Dyspnea; suspect related to PNA.  CT angio negative for pe.  Troponin times 2 negative.  ECHO with worsening EF 25-30 % prior at 65 %. had Cath 9 / 2018; 1. Severe stenosis mid LAD. Patent LIMA graft to mid LAD. 2. Severe stenosis moderate caliber first diagonal branch. Patent vein graft to the Diagonal branch. Severe stenosis large caliber intermediate branch. Patent vein graft to the intermediate branch. Patent Circumflex artery. Patent RCA Cardiology consulted. Cath planned for today. See report above.    History of CABG;  Continue with aspirin, Lipitor, metoprolol./   HTN; Continue with Hydralazine, metoprolol.   DM type II;  Continue with lantus and SSI.   HLD; statins.   Depression/Anxiety Cont celexa No SI/HI Prn klonopin & atarax  Hypothyroidism; continue with synthroid.   CIDP, also DM neuropathy; follows with Dr Anne Hahn. He gets IVIG.   PT , OT   Gastroparesis; reglan change to TID PRN.  Nausea, after coughing. PRN phenergan.   Chronic pain; change dilaudid to PRN.   Prolong QT;  Change azithro to doxy  Improved to 480.   DVT prophylaxis: Heparin.  Code Status: full code.  Family Communication: care discussed with patient.  Disposition Plan: home when stable.   Consultants:   Nephrology    Procedures: ECHO Ef 25--35 %   Antimicrobials: cefepime 2-28 Vancomycin 2-28  Subjective: He report breathing better, chest pain improved.    Objective: Vitals:   03/06/17 1330 03/06/17  1345 03/06/17 1400 03/06/17 1415  BP: 133/83 135/75 132/76 140/72  Pulse: 67 (!) 40 64 63  Resp: 17 16 17 16   Temp:      TempSrc:      SpO2: 99% 94% 100% 99%  Weight:      Height:        Intake/Output Summary (Last 24 hours) at 03/06/2017 1431 Last data filed at 03/06/2017 0601 Gross per 24 hour  Intake 100 ml  Output 0 ml  Net 100 ml   Filed Weights   03/05/17  0640 03/05/17 1145 03/06/17 0500  Weight: 118.4 kg (261 lb 0.4 oz) 113.4 kg (250 lb 0 oz) 117 kg (257 lb 15 oz)    Examination:  General exam: NAD Respiratory system: Normal respiratory effort, CTA Cardiovascular system: S 1, S 2 RRR Gastrointestinal system: BS present, soft, nt Central nervous system:  Quadriparesis.  Extremities: no edema Skin; No rash.     Data Reviewed: I have personally reviewed following labs and imaging studies  CBC: Recent Labs  Lab 03/01/17 1312 03/01/17 2049 03/02/17 0510 03/03/17 1041 03/05/17 0528  WBC 8.2 10.0 7.3 6.7 11.0*  NEUTROABS  --   --  5.4  --   --   HGB 12.7* 12.7* 11.3* 12.3* 10.4*  HCT 38.5* 38.1* 34.9* 37.8* 33.0*  MCV 88.5 88.4 90.4 91.3 92.7  PLT 273 282 263 145* 259   Basic Metabolic Panel: Recent Labs  Lab 03/01/17 1312 03/01/17 1502 03/01/17 2049 03/02/17 0510 03/03/17 1041 03/05/17 0528  NA 131*  --   --  132* 132* 130*  K 4.2  --   --  4.0 3.9 4.5  CL 88*  --   --  92* 91* 93*  CO2 25  --   --  25 26 21*  GLUCOSE 337*  --   --  256* 232* 249*  BUN 29*  --   --  37* 29* 73*  CREATININE 6.90*  --  7.15* 7.88* 6.09* 10.23*  CALCIUM 9.3  --   --  8.9 9.4 9.1  MG  --  2.1 2.0  --   --   --   PHOS  --  6.8* 6.2*  --   --   --    GFR: Estimated Creatinine Clearance: 12.3 mL/min (A) (by C-G formula based on SCr of 10.23 mg/dL (H)). Liver Function Tests: Recent Labs  Lab 03/01/17 1312  AST 21  ALT 14*  ALKPHOS 82  BILITOT 0.8  PROT 7.9  ALBUMIN 3.7   Recent Labs  Lab 03/01/17 1312  LIPASE 45   No results for input(s): AMMONIA in the last 168 hours. Coagulation Profile: Recent Labs  Lab 03/06/17 0459  INR 1.18   Cardiac Enzymes: No results for input(s): CKTOTAL, CKMB, CKMBINDEX, TROPONINI in the last 168 hours. BNP (last 3 results) No results for input(s): PROBNP in the last 8760 hours. HbA1C: No results for input(s): HGBA1C in the last 72 hours. CBG: Recent Labs  Lab 03/05/17 1301  03/05/17 1650 03/05/17 2058 03/06/17 0740 03/06/17 1240  GLUCAP 164* 200* 164* 167* 132*   Lipid Profile: No results for input(s): CHOL, HDL, LDLCALC, TRIG, CHOLHDL, LDLDIRECT in the last 72 hours. Thyroid Function Tests: No results for input(s): TSH, T4TOTAL, FREET4, T3FREE, THYROIDAB in the last 72 hours. Anemia Panel: No results for input(s): VITAMINB12, FOLATE, FERRITIN, TIBC, IRON, RETICCTPCT in the last 72 hours. Sepsis Labs: Recent Labs  Lab 03/01/17 1615  LATICACIDVEN 1.75    Recent  Results (from the past 240 hour(s))  MRSA PCR Screening     Status: None   Collection Time: 03/01/17 11:30 PM  Result Value Ref Range Status   MRSA by PCR NEGATIVE NEGATIVE Final    Comment:        The GeneXpert MRSA Assay (FDA approved for NASAL specimens only), is one component of a comprehensive MRSA colonization surveillance program. It is not intended to diagnose MRSA infection nor to guide or monitor treatment for MRSA infections. Performed at [facility], [address] [place], [place] [address]          Radiology Studies: No results found.      Scheduled Meds: . [MAR Hold] aspirin EC  81 mg Oral Daily  . [MAR Hold] atorvastatin  80 mg Oral q1800  . [MAR Hold] calcitRIOL  1.25 mcg Oral Q M,W,F-HD  . [MAR Hold] calcium acetate  2,001 mg Oral TID WC  . [MAR Hold] cinacalcet  30 mg Oral Q M,W,F  . [MAR Hold] citalopram  40 mg Oral QHS  . [MAR Hold] dextromethorphan-guaiFENesin  1 tablet Oral BID  . [MAR Hold] doxycycline  100 mg Oral Q12H  . [MAR Hold] fluticasone  1 spray Each Nare Daily  . [MAR Hold] gabapentin  200 mg Oral TID  . [MAR Hold] heparin  5,000 Units Subcutaneous Q8H  . [MAR Hold] insulin aspart  0-5 Units Subcutaneous QHS  . [MAR Hold] insulin aspart  0-9 Units Subcutaneous TID WC  . [MAR Hold] insulin glargine  20 Units Subcutaneous QHS  . [MAR Hold] levothyroxine  50 mcg Oral QAC breakfast  . [MAR Hold] metoprolol succinate  100 mg  Oral BID  . [MAR Hold] pantoprazole  40 mg Oral BID  . sodium chloride flush  3 mL Intravenous Q12H   Continuous Infusions: . sodium chloride    . sodium chloride    . [MAR Hold] ceFEPime (MAXIPIME) IV Stopped (03/05/17 1800)  . [MAR Hold] ferric gluconate (FERRLECIT/NULECIT) IV       LOS: 4 days    Time spent:35 minutes     Alba Cory, MD [facility] Pager [phone]  If 7PM-7AM, please contact night-coverage [email or web address] Password [facility] 03/06/2017, 2:31 PM

## 2017-03-06 NOTE — Progress Notes (Addendum)
Subjective:  Tolerated  Hd yesterday on schedule , for Card cath today ~~ 12 noon eval decr EF since Fall 2108   Objective Vital signs in last 24 hours: Vitals:   03/05/17 2123 03/06/17 0427 03/06/17 0500 03/06/17 0823  BP: (!) 150/94 123/89  134/82  Pulse: 75 70  75  Resp: 18 17  17   Temp: 97.6 F (36.4 C) 97.8 F (36.6 C)  98 F (36.7 C)  TempSrc: Oral Oral  Oral  SpO2: 100% 100%  98%  Weight:   117 kg (257 lb 15 oz)   Height:       Weight change: -4.444 kg (-12.8 oz)  Physical Exam: General: alert, obese WM NAD  Heart: RRR, no m, r, g Lungs: CTA , unlabored breathing  Abdomen: obes, soft , Nt, ND Extremities: no pedal edema  Dialysis Access: pos bruit LUA AVF    Dialysis: [facility] MWF 4h 15min   112kg  2/2.25 bath  Hep 8000 +2000 midrun  LFA AVF -Calcitriol 1.7825mcg po/HD -Mircera 50 q 2wks (last on 02/28/17) -Venofer 50mg  q wkly   Problem/Plan: 1.  Dyspnea Multifactorial= with PNA (on po doxy)/ CM /Vol overload ( yest uf 4.25 l on hd  Stable bp ,? Lung mass (op scan 4-5 weeks)  2. ESRD - HD MWF  Schedule , next hd in am  3. CM EF 25-30% =lower than 4/5 months ago with HO CAD/Cabg  - Card eval with Card Cath this am  4. HTN/volume -stable , attempt uf 4l  on hd in am as dw pt.  5. Anemia -hgb 10.4  No esa ( last given 02/28/17 )  Weekly venofer on hd  Follow up hgb  trend  6. Secondary hyperparathyroidism - phos 6.2 / ca 9.4 on phoslo and sensipar / po vit d on hd  7. Dm type 2 - per admit  8. Depression /anxiety - Citalopram  9. Gatroparesis = TID Reglan   Lenny Pastelavid Zeyfang, PA-C [facility] Beeper [phone] 03/06/2017,9:47 AM  LOS: 4 days   Pt seen, examined and agree w A/P as above.   Vinson Moselleob Tanyla Stege MD [facility] pager [phone]   03/06/2017, 12:56 PM    Labs: Basic Metabolic Panel: Recent Labs  Lab 03/01/17 1502 03/01/17 2049 03/02/17 0510 03/03/17 1041 03/05/17 0528  NA  --   --  132* 132* 130*  K  --   --  4.0  3.9 4.5  CL  --   --  92* 91* 93*  CO2  --   --  25 26 21*  GLUCOSE  --   --  256* 232* 249*  BUN  --   --  37* 29* 73*  CREATININE  --  7.15* 7.88* 6.09* 10.23*  CALCIUM  --   --  8.9 9.4 9.1  PHOS 6.8* 6.2*  --   --   --    Liver Function Tests: Recent Labs  Lab 03/01/17 1312  AST 21  ALT 14*  ALKPHOS 82  BILITOT 0.8  PROT 7.9  ALBUMIN 3.7   Recent Labs  Lab 03/01/17 1312  LIPASE 45   No results for input(s): AMMONIA in the last 168 hours. CBC: Recent Labs  Lab 03/01/17 1312 03/01/17 2049 03/02/17 0510 03/03/17 1041 03/05/17 0528  WBC 8.2 10.0 7.3 6.7 11.0*  NEUTROABS  --   --  5.4  --   --   HGB 12.7* 12.7* 11.3* 12.3* 10.4*  HCT 38.5* 38.1* 34.9* 37.8* 33.0*  MCV 88.5 88.4 90.4 91.3 92.7  PLT 273 282 263 145* 259   Cardiac Enzymes: No results for input(s): CKTOTAL, CKMB, CKMBINDEX, TROPONINI in the last 168 hours. CBG: Recent Labs  Lab 03/04/17 2110 03/05/17 1301 03/05/17 1650 03/05/17 2058 03/06/17 0740  GLUCAP 188* 164* 200* 164* 167*    Studies/Results: No results found. Medications: . sodium chloride    . sodium chloride    . ceFEPime (MAXIPIME) IV Stopped (03/05/17 1800)  . [START ON 03/07/2017] ferric gluconate (FERRLECIT/NULECIT) IV     . aspirin EC  81 mg Oral Daily  . atorvastatin  80 mg Oral q1800  . calcitRIOL  1.25 mcg Oral Q M,W,F-HD  . calcium acetate  2,001 mg Oral TID WC  . cinacalcet  30 mg Oral Q M,W,F  . citalopram  40 mg Oral QHS  . dextromethorphan-guaiFENesin  1 tablet Oral BID  . doxycycline  100 mg Oral Q12H  . fluticasone  1 spray Each Nare Daily  . gabapentin  200 mg Oral TID  . heparin  5,000 Units Subcutaneous Q8H  . insulin aspart  0-5 Units Subcutaneous QHS  . insulin aspart  0-9 Units Subcutaneous TID WC  . insulin glargine  20 Units Subcutaneous QHS  . levothyroxine  50 mcg Oral QAC breakfast  . metoprolol succinate  100 mg Oral BID  . pantoprazole  40 mg Oral BID  . sodium chloride flush  3 mL  Intravenous Q12H

## 2017-03-06 NOTE — Op Note (Signed)
   Brief Right and Left Heart Catheterization Report  03/06/2017  12:29 PM  PATIENT:  Russell [name]  47 y.o. male with a hx of ESRD, CAD with prior CABG (x3, LIMA-LAD, SVG-D1, SVG-RI), HTN, DM2,who was evaluated by Dr. Elease HashimotoNahser from cardiology consult team to evaluate shortness of breath.  He was noted to have a reduced ejection fraction from prior evaluation on echocardiogram.  He is therefore referred for right and left heart catheterization.  PRE-OPERATIVE DIAGNOSIS:  cad - hf  POST-OPERATIVE DIAGNOSIS:   Stable coronary arteries, suspect reduced EF is more volume/non-macrovascular ischemia related  Severely diseased LAD, D1 and RI.  Widely patent grafts, normal right coronary artery..  Moderate pulmonary hypertension with PA pressure 49/18 mmHg with mean pressure of 35 mmHg.  Elevated LVEDP of 30 mmHg with PCWP 28 mmHg --> this correlates with elevated RVEDP of 19 mmHg and RA pressure of 16-18 mmHg.  Cardiac output by thermal dilution is 4.12 with an index of 1.72.  This is more commensurate with a EF of roughly 35% and global hypokinesis.  (Cardiac output by FICK does not appear to be accurate)  PA Sat 67%, Ao Sat 99%.  PROCEDURE:  Procedure(s): RIGHT/LEFT HEART CATH AND CORONARY/GRAFT ANGIOGRAPHY (N/A)  Access: RCFA-5 French sheath, fluoroscopic guided modified Seldinger technique; R CFV 7 French sheath, 7 technique  RHC: 7 French Swan-Ganz catheter advanced under fluoroscopy into the right atrium, right ventricle, pulmonary artery and wedge position for pressure sampling.  Simultaneous blood samples were drawn from the pulmonary artery as well as aorta for calculation of cardiac output by Fick.  Thermodilution injection were also performed.  Catheter was then removed out of the body.  LHC-Coronary-Graft Angios: 5 [place] standard Judkins plus IMA catheter advanced and exchanged over the J-wire (IMA catheter was exchanged over the long exchange J-wire with a wire into the  subclavian artery).  IMA angiography: IMA catheter  RCA and SVG-D1, SVG-RI angiography: JR4 catheter  LCA angiography: JL4 catheter Catheter was removed out of the body over a wire.  Sheaths will be removed in the PACU holding area with manual pressure held for hemostasis.  SURGEON:  Surgeon(s) and Role:    * Marykay LexHarding, Georgiana Spillane W, MD - Primary  ANESTHESIA:   local and IV sedation ; 1 mg Versed, 25 mg fentanyl (sedation time 59 minutes)  EBL:   <20 mL  MEDICATIONS USED:  LIDOCAINE 20 mL, Contrast 95 ML  DICTATION: .Note written in EPIC  PLAN OF CARE: Transfer to telemetry unit for post cath monitoring.   Likely nonischemic/microvascular etiology for reduced EF  Recommend increased volume removal during dialysis  Restart hydralazine and likely titrate up for afterload reduction.  PATIENT DISPOSITION:  PACU - hemodynamically stable.   Bryan Lemmaavid Rosmarie Esquibel, M.D., M.S. Interventional Cardiologist   Pager # [phone] Phone # [phone] [address] [place], [place] [address]

## 2017-03-06 NOTE — Progress Notes (Signed)
Russell [name]'s venous sheath and arterial sheaths were removed and manual pressure held for 20 minutes. Vital signs remain stable. Russell JohnBrian has been given instructions on care of the site and when to call for assistance. Bed rest to begin at 1300.

## 2017-03-06 NOTE — H&P (View-Only) (Signed)
Progress Note  Patient Name: Russell [name] Date of Encounter: 03/06/2017  Primary Cardiologist: Jens Som  Subjective   Russell [name] is a 47 y.o. male with a hx of ESRD, CAD with prior CABG, HTN, DM2,  who is being seen today for the evaluation of SOB at the request of Dr Sunnie Nielsen  EF has dropped since fall of last year  Scheduled for cath today   Inpatient Medications    Scheduled Meds: . aspirin EC  81 mg Oral Daily  . atorvastatin  80 mg Oral q1800  . calcitRIOL  1.25 mcg Oral Q M,W,F-HD  . calcium acetate  2,001 mg Oral TID WC  . cinacalcet  30 mg Oral Q M,W,F  . citalopram  40 mg Oral QHS  . dextromethorphan-guaiFENesin  1 tablet Oral BID  . doxycycline  100 mg Oral Q12H  . fluticasone  1 spray Each Nare Daily  . gabapentin  200 mg Oral TID  . heparin  5,000 Units Subcutaneous Q8H  . insulin aspart  0-5 Units Subcutaneous QHS  . insulin aspart  0-9 Units Subcutaneous TID WC  . insulin glargine  20 Units Subcutaneous QHS  . levothyroxine  50 mcg Oral QAC breakfast  . metoprolol succinate  100 mg Oral BID  . pantoprazole  40 mg Oral BID  . sodium chloride flush  3 mL Intravenous Q12H   Continuous Infusions: . sodium chloride    . sodium chloride    . ceFEPime (MAXIPIME) IV Stopped (03/05/17 1800)  . [START ON 03/07/2017] ferric gluconate (FERRLECIT/NULECIT) IV     PRN Meds: sodium chloride, acetaminophen, calcium acetate, clonazePAM, HYDROmorphone, hydrOXYzine, ipratropium-albuterol, metoCLOPramide, ondansetron (ZOFRAN) IV, sodium chloride flush   Vital Signs    Vitals:   03/05/17 2123 03/06/17 0427 03/06/17 0500 03/06/17 0823  BP: (!) 150/94 123/89  134/82  Pulse: 75 70  75  Resp: 18 17  17   Temp: 97.6 F (36.4 C) 97.8 F (36.6 C)  98 F (36.7 C)  TempSrc: Oral Oral  Oral  SpO2: 100% 100%  98%  Weight:   257 lb 15 oz (117 kg)   Height:        Intake/Output Summary (Last 24 hours) at 03/06/2017 0902 Last data filed at 03/06/2017 0601 Gross per 24  hour  Intake 100 ml  Output 0 ml  Net 100 ml   Filed Weights   03/05/17 0640 03/05/17 1145 03/06/17 0500  Weight: 261 lb 0.4 oz (118.4 kg) 250 lb 0 oz (113.4 kg) 257 lb 15 oz (117 kg)    Telemetry     - Personally Reviewed  ECG      Physical Exam   Physical Exam: Blood pressure 134/82, pulse 75, temperature 98 F (36.7 C), temperature source Oral, resp. rate 17, height 6\' 2"  (1.88 m), weight 257 lb 15 oz (117 kg), SpO2 98 %.  GEN:  Young , obese male, NAD  HEENT: Normal NECK: No JVD; No carotid bruits LYMPHATICS: No lymphadenopathy CARDIAC: RRR  RESPIRATORY:  Clear to auscultation without rales, wheezing or rhonchi  ABDOMEN: obese  MUSCULOSKELETAL:  No edema; No deformity  SKIN: Warm and dry NEUROLOGIC:  Alert and oriented x 3   Labs    Chemistry Recent Labs  Lab 03/01/17 1312  03/02/17 0510 03/03/17 1041 03/05/17 0528  NA 131*  --  132* 132* 130*  K 4.2  --  4.0 3.9 4.5  CL 88*  --  92* 91* 93*  CO2 25  --  25 26 21*  GLUCOSE 337*  --  256* 232* 249*  BUN 29*  --  37* 29* 73*  CREATININE 6.90*   < > 7.88* 6.09* 10.23*  CALCIUM 9.3  --  8.9 9.4 9.1  PROT 7.9  --   --   --   --   ALBUMIN 3.7  --   --   --   --   AST 21  --   --   --   --   ALT 14*  --   --   --   --   ALKPHOS 82  --   --   --   --   BILITOT 0.8  --   --   --   --   GFRNONAA 9*   < > 7* 10* 5*  GFRAA 10*   < > 8* 12* 6*  ANIONGAP 18*  --  15 15 16*   < > = values in this interval not displayed.     Hematology Recent Labs  Lab 03/02/17 0510 03/03/17 1041 03/05/17 0528  WBC 7.3 6.7 11.0*  RBC 3.86* 4.14* 3.56*  HGB 11.3* 12.3* 10.4*  HCT 34.9* 37.8* 33.0*  MCV 90.4 91.3 92.7  MCH 29.3 29.7 29.2  MCHC 32.4 32.5 31.5  RDW 15.7* 15.8* 15.7*  PLT 263 145* 259    Cardiac EnzymesNo results for input(s): TROPONINI in the last 168 hours.  Recent Labs  Lab 03/01/17 1355 03/01/17 1613  TROPIPOC 0.04 0.04     BNPNo results for input(s): BNP, PROBNP in the last 168 hours.    DDimer No results for input(s): DDIMER in the last 168 hours.   Radiology    No results found.  Cardiac Studies      Patient Profile     47 y.o. male   Assessment & Plan    1.  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 Scheduled for right and left heart cath , cors, grafts, possible PCI  Discussed risks, benefits, optoins.   He understands and agrees to proceed.    2.  CAD - s/p CABG, no angina   3.  HTN:   Remains well controlled.   4. ESRD:   Dialysis   For questions or updates, please contact [facility] Please consult [email or web address] for contact info under Cardiology/STEMI.      Signed, Kristeen MissPhilip Puanani Gene, MD  03/06/2017, 9:02 AM

## 2017-03-06 NOTE — Interval H&P Note (Signed)
History and Physical Interval Note:  03/06/2017 11:00 AM  Russell [name]  has presented today for surgery, with the diagnosis of cad - hf - drop in EF.    The various methods of treatment have been discussed with the patient and family. After consideration of risks, benefits and other options for treatment, the patient has consented to  Procedure(s): RIGHT/LEFT HEART CATH AND CORONARY/GRAFT ANGIOGRAPHY (N/A) with possible PERCUTANEOUS CORONARY INTERVENTION as a surgical intervention .  The patient's history has been reviewed, patient examined, no change in status, stable for surgery.  I have reviewed the patient's chart and labs.  Questions were answered to the patient's satisfaction.    Cath Lab Visit (complete for each Cath Lab visit)  Clinical Evaluation Leading to the Procedure:   ACS: No.  Non-ACS:    Anginal Classification: CCS III  Anti-ischemic medical therapy: Minimal Therapy (1 class of medications)  Non-Invasive Test Results: No non-invasive testing performed  - BUT REDUCED EF ON ECHO  Prior CABG: Previous CABG   Bryan Lemmaavid Saran Laviolette

## 2017-03-07 ENCOUNTER — Encounter (HOSPITAL_COMMUNITY): Payer: Self-pay | Admitting: Cardiology

## 2017-03-07 ENCOUNTER — Ambulatory Visit: Payer: Medicare Other | Admitting: Internal Medicine

## 2017-03-07 DIAGNOSIS — I2510 Atherosclerotic heart disease of native coronary artery without angina pectoris: Secondary | ICD-10-CM

## 2017-03-07 DIAGNOSIS — E1142 Type 2 diabetes mellitus with diabetic polyneuropathy: Secondary | ICD-10-CM

## 2017-03-07 DIAGNOSIS — E1165 Type 2 diabetes mellitus with hyperglycemia: Secondary | ICD-10-CM

## 2017-03-07 DIAGNOSIS — E1151 Type 2 diabetes mellitus with diabetic peripheral angiopathy without gangrene: Secondary | ICD-10-CM

## 2017-03-07 DIAGNOSIS — I10 Essential (primary) hypertension: Secondary | ICD-10-CM

## 2017-03-07 DIAGNOSIS — Z992 Dependence on renal dialysis: Secondary | ICD-10-CM

## 2017-03-07 LAB — CBC
HEMATOCRIT: 29.6 % — AB (ref 39.0–52.0)
HEMOGLOBIN: 9.3 g/dL — AB (ref 13.0–17.0)
MCH: 29.2 pg (ref 26.0–34.0)
MCHC: 31.4 g/dL (ref 30.0–36.0)
MCV: 92.8 fL (ref 78.0–100.0)
Platelets: 251 10*3/uL (ref 150–400)
RBC: 3.19 MIL/uL — ABNORMAL LOW (ref 4.22–5.81)
RDW: 15.7 % — ABNORMAL HIGH (ref 11.5–15.5)
WBC: 8.8 10*3/uL (ref 4.0–10.5)

## 2017-03-07 LAB — GLUCOSE, CAPILLARY
Glucose-Capillary: 132 mg/dL — ABNORMAL HIGH (ref 65–99)
Glucose-Capillary: 115 mg/dL — ABNORMAL HIGH (ref 65–99)
Glucose-Capillary: 228 mg/dL — ABNORMAL HIGH (ref 65–99)

## 2017-03-07 LAB — RENAL FUNCTION PANEL
ALBUMIN: 3.1 g/dL — AB (ref 3.5–5.0)
Anion gap: 14 (ref 5–15)
BUN: 71 mg/dL — AB (ref 6–20)
CO2: 20 mmol/L — ABNORMAL LOW (ref 22–32)
CREATININE: 9.11 mg/dL — AB (ref 0.61–1.24)
Calcium: 8.5 mg/dL — ABNORMAL LOW (ref 8.9–10.3)
Chloride: 97 mmol/L — ABNORMAL LOW (ref 101–111)
GFR, EST AFRICAN AMERICAN: 7 mL/min — AB (ref >60)
GFR, EST NON AFRICAN AMERICAN: 6 mL/min — AB (ref >60)
Glucose, Bld: 171 mg/dL — ABNORMAL HIGH (ref 65–99)
PHOSPHORUS: 6.8 mg/dL — AB (ref 2.5–4.6)
Potassium: 4.9 mmol/L (ref 3.5–5.1)
Sodium: 131 mmol/L — ABNORMAL LOW (ref 135–145)

## 2017-03-07 MED ORDER — CALCITRIOL 0.5 MCG PO CAPS
ORAL_CAPSULE | ORAL | Status: AC
  Administered 2017-03-07: 1 ug
  Filled 2017-03-07: qty 2

## 2017-03-07 MED ORDER — HEPARIN SODIUM (PORCINE) 1000 UNIT/ML IJ SOLN
2000.0000 [IU] | Freq: Once | INTRAMUSCULAR | Status: AC
  Administered 2017-03-07: 2000 [IU] via INTRAVENOUS
  Filled 2017-03-07: qty 2

## 2017-03-07 MED ORDER — HEPARIN SODIUM (PORCINE) 1000 UNIT/ML IJ SOLN
4000.0000 [IU] | Freq: Once | INTRAMUSCULAR | Status: AC
  Administered 2017-03-07: 11:00:00 4000 [IU] via INTRAVENOUS
  Filled 2017-03-07: qty 4

## 2017-03-07 MED ORDER — CALCITRIOL 0.25 MCG PO CAPS
ORAL_CAPSULE | ORAL | Status: AC
  Administered 2017-03-07: 0.5 ug
  Filled 2017-03-07: qty 1

## 2017-03-07 NOTE — Plan of Care (Signed)
  Progressing Education: Knowledge of General Education information will improve 03/07/2017 0020 - Progressing by Leata MouseAninon, Amil Bouwman S, RN Health Behavior/Discharge Planning: Ability to manage health-related needs will improve 03/07/2017 0020 - Progressing by Leata MouseAninon, Greidys Deland S, RN Clinical Measurements: Ability to maintain clinical measurements within normal limits will improve 03/07/2017 0020 - Progressing by Leata MouseAninon, Lasonya Hubner S, RN Will remain free from infection 03/07/2017 0020 - Progressing by Leata MouseAninon, Latoy Labriola S, RN Diagnostic test results will improve 03/07/2017 0020 - Progressing by Leata MouseAninon, Ophelia Sipe S, RN Respiratory complications will improve 03/07/2017 0020 - Progressing by Leata MouseAninon, Giulianna Rocha S, RN Cardiovascular complication will be avoided 03/07/2017 0020 - Progressing by Leata MouseAninon, Latroya Ng S, RN Activity: Risk for activity intolerance will decrease 03/07/2017 0020 - Progressing by Leata MouseAninon, [place] S, RN Nutrition: Adequate nutrition will be maintained 03/07/2017 0020 - Progressing by Leata MouseAninon, Aileana Hodder S, RN Coping: Level of anxiety will decrease 03/07/2017 0020 - Progressing by Leata MouseAninon, Teodor Prater S, RN Elimination: Will not experience complications related to bowel motility 03/07/2017 0020 - Progressing by Leata MouseAninon, Kimla Furth S, RN Will not experience complications related to urinary retention 03/07/2017 0020 - Progressing by Leata MouseAninon, Hayze Gazda S, RN

## 2017-03-07 NOTE — Progress Notes (Addendum)
Subjective:  Seen on hd, no cos ,tolerating uf , dw need to be adherent to  Renal Diet Fluid Limit  ,Dw him Card cath results  Objective Vital signs in last 24 hours: Vitals:   03/06/17 1700 03/06/17 2004 03/07/17 0321 03/07/17 0830  BP: (!) 118/25 140/87 (!) 145/69 100/67  Pulse: 60 68 62 (!) 58  Resp: 15 19 14 11   Temp:  98.1 F (36.7 C) (!) 97.4 F (36.3 C) (!) 97.4 F (36.3 C)  TempSrc:  Oral Oral Oral  SpO2: 100% 100% 96% 100%  Weight:   117 kg (257 lb 15 oz)   Height:       Weight change: 3.6 kg (7 lb 15 oz)   Physical Exam: General:  on hd, alert, obese WM NAD  Heart: RRR,  1/6sem , No  r, g Lungs: CTA , unlabored breathing  Abdomen: obes, soft , Nt, ND Extremities: no pedal edema  Dialysis Access: pos bruit LUA AVF    Dialysis: [facility] MWF 4h 15min 112kg 2/2.25 bath Hep 8000 +2000 midrun LFA AVF -Calcitriol 1.10625mcg po/HD -Mircera 50 q 2wks (last on 02/28/17) -Venofer 50mg  q wkly   Problem/Plan: 1.  Dyspnea Multifactorial= with PNA (on po doxy)/ CM /Vol overload  With  Card cath yest Insignificant CAD uf on hd  Today   /Lung mass (op scan 4-5 weeks)  2. ESRD - HD MWF  Schedule   3. CM EF 25-30% =lower than 4/5 months ago with HO CAD/Cabg  - Card eval with Card Cath yest= Insignificant CAD 4. HTN/volume -stable , attempt uf 5l  on hd today and tolerating so fu post wt /bp / had discussion with him about fluid restriction   5. Anemia -hgb 10.0  No esa ( last given 02/28/17 )  Weekly venofer on hd  Follow up hgb  trend  6. Secondary hyperparathyroidism - phos 6.2 / ca 9.4 on phoslo and sensipar / po vit d on hd  7. Dm type 2 - per admit  8. Depression /anxiety - Citalopram  9. Gatroparesis = TID Reglan   Russell Pastelavid Zeyfang, PA-C [facility] Beeper [phone] 03/07/2017,2:18 PM  LOS: 5 days   Pt seen, examined and agree w A/P as above. Discussed with cardiology who note that no sig new CAD, but +signs of vol overload which they will not be  able to address in this pt with ESRD.  Have d/w pt the importance of fluid restriction long-term .   Vinson Moselleob Valerio Pinard MD [facility] pager [phone]   03/07/2017, 2:47 PM    Labs: Basic Metabolic Panel: Recent Labs  Lab 03/01/17 1502 03/01/17 2049  03/03/17 1041 03/05/17 0528 03/06/17 1812 03/07/17 1109  NA  --   --    < > 132* 130*  --  131*  K  --   --    < > 3.9 4.5  --  4.9  CL  --   --    < > 91* 93*  --  97*  CO2  --   --    < > 26 21*  --  20*  GLUCOSE  --   --    < > 232* 249*  --  171*  BUN  --   --    < > 29* 73*  --  71*  CREATININE  --  7.15*   < > 6.09* 10.23* 8.36* 9.11*  CALCIUM  --   --    < > 9.4 9.1  --  8.5*  PHOS 6.8* 6.2*  --   --   --   --  6.8*   < > = values in this interval not displayed.   Liver Function Tests: Recent Labs  Lab 03/01/17 1312 03/07/17 1109  AST 21  --   ALT 14*  --   ALKPHOS 82  --   BILITOT 0.8  --   PROT 7.9  --   ALBUMIN 3.7 3.1*   Recent Labs  Lab 03/01/17 1312  LIPASE 45   No results for input(s): AMMONIA in the last 168 hours. CBC: Recent Labs  Lab 03/02/17 0510 03/03/17 1041 03/05/17 0528 03/06/17 1812 03/07/17 0310  WBC 7.3 6.7 11.0* 7.6 8.8  NEUTROABS 5.4  --   --   --   --   HGB 11.3* 12.3* 10.4* 10.0* 9.3*  HCT 34.9* 37.8* 33.0* 32.3* 29.6*  MCV 90.4 91.3 92.7 93.1 92.8  PLT 263 145* 259 272 251   Cardiac Enzymes: No results for input(s): CKTOTAL, CKMB, CKMBINDEX, TROPONINI in the last 168 hours. CBG: Recent Labs  Lab 03/06/17 0740 03/06/17 1240 03/06/17 1716 03/06/17 2117 03/07/17 0655  GLUCAP 167* 132* 238* 183* 132*    Studies/Results: No results found. Medications: . sodium chloride    . ceFEPime (MAXIPIME) IV Stopped (03/06/17 1900)  . ferric gluconate (FERRLECIT/NULECIT) IV     . aspirin EC  81 mg Oral Daily  . atorvastatin  80 mg Oral q1800  . calcitRIOL  1.25 mcg Oral Q M,W,F-HD  . calcium acetate  2,001 mg Oral TID WC  . cinacalcet  30 mg Oral Q M,W,F  .  citalopram  40 mg Oral QHS  . dextromethorphan-guaiFENesin  1 tablet Oral BID  . doxycycline  100 mg Oral Q12H  . fluticasone  1 spray Each Nare Daily  . gabapentin  200 mg Oral TID  . heparin  2,000 Units Intravenous Once  . heparin  4,000 Units Intravenous Once  . heparin  5,000 Units Subcutaneous Q8H  . insulin aspart  0-5 Units Subcutaneous QHS  . insulin aspart  0-9 Units Subcutaneous TID WC  . insulin glargine  20 Units Subcutaneous QHS  . levothyroxine  50 mcg Oral QAC breakfast  . metoprolol succinate  100 mg Oral BID  . pantoprazole  40 mg Oral BID  . sodium chloride flush  3 mL Intravenous Q12H

## 2017-03-07 NOTE — Progress Notes (Signed)
Progress Note  Patient Name: Russell [name] Date of Encounter: 03/07/2017  Primary Cardiologist: Jens Som   Subjective   47 y.o.malewith a hx of ESRD, CAD with prior CABG, HTN, DM2,who is being seen today for the evaluation of SOBat the request of Dr Sunnie Nielsen  Cath yesterday revealed insiginficant CAD.   Severe volume overload   Inpatient Medications    Scheduled Meds: . aspirin EC  81 mg Oral Daily  . atorvastatin  80 mg Oral q1800  . calcitRIOL  1.25 mcg Oral Q M,W,F-HD  . calcium acetate  2,001 mg Oral TID WC  . cinacalcet  30 mg Oral Q M,W,F  . citalopram  40 mg Oral QHS  . dextromethorphan-guaiFENesin  1 tablet Oral BID  . doxycycline  100 mg Oral Q12H  . fluticasone  1 spray Each Nare Daily  . gabapentin  200 mg Oral TID  . heparin  2,000 Units Intravenous Once  . heparin  4,000 Units Intravenous Once  . heparin  5,000 Units Subcutaneous Q8H  . insulin aspart  0-5 Units Subcutaneous QHS  . insulin aspart  0-9 Units Subcutaneous TID WC  . insulin glargine  20 Units Subcutaneous QHS  . levothyroxine  50 mcg Oral QAC breakfast  . metoprolol succinate  100 mg Oral BID  . pantoprazole  40 mg Oral BID  . sodium chloride flush  3 mL Intravenous Q12H   Continuous Infusions: . sodium chloride    . ceFEPime (MAXIPIME) IV Stopped (03/06/17 1900)  . ferric gluconate (FERRLECIT/NULECIT) IV     PRN Meds: sodium chloride, acetaminophen, calcium acetate, clonazePAM, hydrALAZINE, HYDROmorphone, hydrOXYzine, ipratropium-albuterol, metoCLOPramide, ondansetron (ZOFRAN) IV, sodium chloride flush   Vital Signs    Vitals:   03/06/17 1700 03/06/17 2004 03/07/17 0321 03/07/17 0830  BP: (!) 118/25 140/87 (!) 145/69 100/67  Pulse: 60 68 62 (!) 58  Resp: 15 19 14 11   Temp:  98.1 F (36.7 C) (!) 97.4 F (36.3 C) (!) 97.4 F (36.3 C)  TempSrc:  Oral Oral Oral  SpO2: 100% 100% 96% 100%  Weight:   257 lb 15 oz (117 kg)   Height:        Intake/Output Summary (Last 24 hours)  at 03/07/2017 1127 Last data filed at 03/07/2017 1610 Gross per 24 hour  Intake 820 ml  Output -  Net 820 ml   Filed Weights   03/05/17 1145 03/06/17 0500 03/07/17 0321  Weight: 250 lb 0 oz (113.4 kg) 257 lb 15 oz (117 kg) 257 lb 15 oz (117 kg)    Telemetry    NSR  - Personally Reviewed  ECG     nsr  - Personally Reviewed  Physical Exam   GEN:  mmiddle age, male, obese   Neck: No JVD Cardiac: RRR,  Soft systolic murmru  Respiratory: Clear to auscultation bilaterally. GI: Soft, nontender, non-distended  MS: No edema; No deformity. Neuro:  Nonfocal  Psych: Normal affect   Labs    Chemistry Recent Labs  Lab 03/01/17 1312  03/02/17 0510 03/03/17 1041 03/05/17 0528 03/06/17 1812  NA 131*  --  132* 132* 130*  --   K 4.2  --  4.0 3.9 4.5  --   CL 88*  --  92* 91* 93*  --   CO2 25  --  25 26 21*  --   GLUCOSE 337*  --  256* 232* 249*  --   BUN 29*  --  37* 29* 73*  --  CREATININE 6.90*   < > 7.88* 6.09* 10.23* 8.36*  CALCIUM 9.3  --  8.9 9.4 9.1  --   PROT 7.9  --   --   --   --   --   ALBUMIN 3.7  --   --   --   --   --   AST 21  --   --   --   --   --   ALT 14*  --   --   --   --   --   ALKPHOS 82  --   --   --   --   --   BILITOT 0.8  --   --   --   --   --   GFRNONAA 9*   < > 7* 10* 5* 7*  GFRAA 10*   < > 8* 12* 6* 8*  ANIONGAP 18*  --  15 15 16*  --    < > = values in this interval not displayed.     Hematology Recent Labs  Lab 03/05/17 0528 03/06/17 1812 03/07/17 0310  WBC 11.0* 7.6 8.8  RBC 3.56* 3.47* 3.19*  HGB 10.4* 10.0* 9.3*  HCT 33.0* 32.3* 29.6*  MCV 92.7 93.1 92.8  MCH 29.2 28.8 29.2  MCHC 31.5 31.0 31.4  RDW 15.7* 15.8* 15.7*  PLT 259 272 251    Cardiac EnzymesNo results for input(s): TROPONINI in the last 168 hours.  Recent Labs  Lab 03/01/17 1355 03/01/17 1613  TROPIPOC 0.04 0.04     BNPNo results for input(s): BNP, PROBNP in the last 168 hours.   DDimer No results for input(s): DDIMER in the last 168 hours.   Radiology      No results found.  Cardiac Studies     Patient Profile     47 y.o. male with hx of CAD and recent development of CHF   Assessment & Plan    1.  Acute on Chronic combined S/D CHF Appears to be due to excessie volume overload. No significant worsening CAD  I've discussed with Dr. Arlean HoppingSchertz.  Will need more volume revomved.   And the patient will need to be more compliant with fluids.   2. CAD - no angina   3. HTN:    Continue meds ,  Further management per nephrology .   For questions or updates, please contact [facility] Please consult [email or web address] for contact info under Cardiology/STEMI.   Will sign off .  Call for questions      Signed, Kristeen MissPhilip Wetona Viramontes, MD  03/07/2017, 11:27 AM

## 2017-03-07 NOTE — Progress Notes (Signed)
Patient transferred to hemodialysis in bed with transport staff. [facility].

## 2017-03-07 NOTE — Progress Notes (Signed)
  Speech Language Pathology Treatment: Dysphagia  Patient Details Name: Russell [name] MRN: [id number] DOB: 1970/08/21 Today's Date: 03/07/2017 Time: 0850-0910 SLP Time Calculation (min) (ACUTE ONLY): 20 min  Assessment / Plan / Recommendation Clinical Impression  Pt was seen for skilled ST targeting dysphagia goals.  Pt reports no change in coughing while eating or in complaints of globus sensation while eating.  SLP provided skilled education regarding esophageal precautions, including postural changes for during and after eating, alternating solids with liquids during meals, avoid reflux triggering items such as drinks containing caffeine, spicy foods, greasy foods, and acidic foods, and eating smaller, more frequent meals.  All questions were answered to pt's satisfaction at this time.  Pt consumed small amounts of regular textures and thin liquids with no overt s/s of aspiration but pt reported complaints of food getting "stuck" at the level of the thyroid notch which required extra time and sips of liquids to clear.  Pt was left in bed with bed alarm set and call bell within reach.  Continue per current plan of care.     HPI HPI: Russell [name] is a 47 y.o. male With pmhx significant for anxiety, asthma, CAD, DVT, depression, gastroparesis, severe erosive esophagitis, DM II who presented with SOB. He was recently diagnosed with CIDP. CTA 03/01/17 showed pna. Per MD notes, pt reports coughing while eating. EGD 03/29/14 findings of severe exudative circumferential esophagitis, appearance consistent with severe reflux.       SLP Plan  Continue with current plan of care       Recommendations  Diet recommendations: Regular;Thin liquid Liquids provided via: Cup;Straw Medication Administration: Whole meds with liquid Supervision: Patient able to self feed Compensations: Slow rate;Small sips/bites;Follow solids with liquid Postural Changes and/or Swallow Maneuvers: Out of bed for meals;Seated  upright 90 degrees;Upright 30-60 min after meal                Oral Care Recommendations: Oral care BID Follow up Recommendations: (TBD) SLP Visit Diagnosis: Dysphagia, unspecified (R13.10) Plan: Continue with current plan of care       GO                Daelin Haste, Melanee SpryNicole L 03/07/2017, 9:15 AM

## 2017-03-07 NOTE — Progress Notes (Signed)
PROGRESS NOTE  Russell [name] ZOX:[id number] DOB: July 09, 1970 DOA: 03/01/2017 PCP: [name], MD  HPI/Recap of past 24 hours: Russell Sheffield Silveris a 47 y.o.male with pmhx significant for anxiety, asthma, CAD, DVT, depression, gastroparesis, DM II, CIDP,  ESRD who presents with dyspnea & orthopnea X 2 weeks. CTA showed CAP. ECHO showed EF of 30% reduced from previous. Cardiology was consulted and recommended cath. Cardiac cath showed stable CA, suspect reduce EF is related to volume-macrovascular ischemia. Pt admitted for further management.  Today, pt reported having some chest discomfort, orthopnea/dyspnea although looked comfortable and was able to talk in full sentences. reports dysphagia as well. Denies any abdominal pain, N/V, fever/chills   Assessment/Plan: Active Problems:   Hypothyroidism (acquired)   Diabetic neuropathy (HCC)   Uncontrolled diabetes mellitus type 2 with peripheral artery disease (HCC)   Morbid obesity (HCC)   Essential hypertension, benign   ESRD on dialysis (HCC)   History of DVT of lower extremity   Quadriparesis (HCC)   CIDP (chronic inflammatory demyelinating polyneuropathy) (HCC)   CAP (community acquired pneumonia)  CAP with lung mass on CT Afebrile with no leukocytosis CT chest with PNA Continue with cefepime, doxycycline, nebs Of note patient was evaluated by Dr Isaiah Serge, pulmonologist 2-28 for lung mass, follow up, will need PET scan in 4-5 weeks  Speech evaluation, recommend regular diet, with thin liquid  Acute on chronic systolic & diastolic HF Still dyspneic with vol overload ECHO with worsening EF 25-30 % prior at 65 %. Cardiology on board: Due to ESRD, pt will need more aggressive HD, strict fluid restriction  CAD/ hx of CABG/chest pain Still dyspneic CT angio negative for PE Troponin times 2 negative.  Cath 03/06/17: Stable CAD, severely diseased LAD, D1 and RI, widely patent grafts, normal RCA Continue with aspirin, Lipitor,  metoprolol  ESRD on HD Nephrology on board  Fluid management by HD  HTN Continue with Hydralazine, metoprolol.  DM type 2 Continue with lantus and SSI.   HLD Statins   Depression/Anxiety Cont celexa No SI/HI Prn klonopin & atarax  Hypothyroidism Continue with synthroid   CIDP Follows with Dr Anne Hahn. He gets IVIG prn  PT/OT  Gastroparesis Stable Reglan TID PRN  Chronic pain Change dilaudid to PRN.   Prolong QT Change azithro to doxy  Improved to 480     Code Status: Full  Family Communication: None at bedside  Disposition Plan: Once SOB improves with HD    Consultants:  Cardiology  Nephrology  Procedures:  Cardiac cath  Antimicrobials:  IV Cefepime  Doxycycline   DVT prophylaxis:  [place] heparin   Objective: Vitals:   03/07/17 1430 03/07/17 1500 03/07/17 1530 03/07/17 1614  BP: 98/64 105/83 108/63 124/74  Pulse: 62 62 62 95  Resp: 15 18 15 14   Temp:   98.2 F (36.8 C) 98.1 F (36.7 C)  TempSrc:   Oral Oral  SpO2:   98% 97%  Weight:   113.9 kg (251 lb 1.7 oz)   Height:        Intake/Output Summary (Last 24 hours) at 03/07/2017 1842 Last data filed at 03/07/2017 1521 Gross per 24 hour  Intake 720 ml  Output 4300 ml  Net -3580 ml   Filed Weights   03/07/17 0321 03/07/17 1045 03/07/17 1530  Weight: 117 kg (257 lb 15 oz) 119 kg (262 lb 5.6 oz) 113.9 kg (251 lb 1.7 oz)    Exam:   General:  NAD, obese  Cardiovascular: RRR, S1, S2  Respiratory: CTA   Abdomen: Soft, obese, NT, ND, BS+  Musculoskeletal: No pedal edema b/l  Skin: Normal  Psychiatry: Appeared slightly anxious, normal affect   Data Reviewed: CBC: Recent Labs  Lab 03/02/17 0510 03/03/17 1041 03/05/17 0528 03/06/17 1812 03/07/17 0310  WBC 7.3 6.7 11.0* 7.6 8.8  NEUTROABS 5.4  --   --   --   --   HGB 11.3* 12.3* 10.4* 10.0* 9.3*  HCT 34.9* 37.8* 33.0* 32.3* 29.6*  MCV 90.4 91.3 92.7 93.1 92.8  PLT 263 145* 259 272 251   Basic Metabolic  Panel: Recent Labs  Lab 03/01/17 1312 03/01/17 1502 03/01/17 2049 03/02/17 0510 03/03/17 1041 03/05/17 0528 03/06/17 1812 03/07/17 1109  NA 131*  --   --  132* 132* 130*  --  131*  K 4.2  --   --  4.0 3.9 4.5  --  4.9  CL 88*  --   --  92* 91* 93*  --  97*  CO2 25  --   --  25 26 21*  --  20*  GLUCOSE 337*  --   --  256* 232* 249*  --  171*  BUN 29*  --   --  37* 29* 73*  --  71*  CREATININE 6.90*  --  7.15* 7.88* 6.09* 10.23* 8.36* 9.11*  CALCIUM 9.3  --   --  8.9 9.4 9.1  --  8.5*  MG  --  2.1 2.0  --   --   --   --   --   PHOS  --  6.8* 6.2*  --   --   --   --  6.8*   GFR: Estimated Creatinine Clearance: 13.6 mL/min (A) (by C-G formula based on SCr of 9.11 mg/dL (H)). Liver Function Tests: Recent Labs  Lab 03/01/17 1312 03/07/17 1109  AST 21  --   ALT 14*  --   ALKPHOS 82  --   BILITOT 0.8  --   PROT 7.9  --   ALBUMIN 3.7 3.1*   Recent Labs  Lab 03/01/17 1312  LIPASE 45   No results for input(s): AMMONIA in the last 168 hours. Coagulation Profile: Recent Labs  Lab 03/06/17 0459  INR 1.18   Cardiac Enzymes: No results for input(s): CKTOTAL, CKMB, CKMBINDEX, TROPONINI in the last 168 hours. BNP (last 3 results) No results for input(s): PROBNP in the last 8760 hours. HbA1C: No results for input(s): HGBA1C in the last 72 hours. CBG: Recent Labs  Lab 03/06/17 1240 03/06/17 1716 03/06/17 2117 03/07/17 0655 03/07/17 1708  GLUCAP 132* 238* 183* 132* 115*   Lipid Profile: No results for input(s): CHOL, HDL, LDLCALC, TRIG, CHOLHDL, LDLDIRECT in the last 72 hours. Thyroid Function Tests: No results for input(s): TSH, T4TOTAL, FREET4, T3FREE, THYROIDAB in the last 72 hours. Anemia Panel: No results for input(s): VITAMINB12, FOLATE, FERRITIN, TIBC, IRON, RETICCTPCT in the last 72 hours. Urine analysis:    Component Value Date/Time   COLORURINE YELLOW 05/18/2016 2110   APPEARANCEUR HAZY (A) 05/18/2016 2110   LABSPEC 1.018 05/18/2016 2110   PHURINE 7.0  05/18/2016 2110   GLUCOSEU >=500 (A) 05/18/2016 2110   HGBUR NEGATIVE 05/18/2016 2110   BILIRUBINUR NEGATIVE 05/18/2016 2110   BILIRUBINUR neg 05/26/2014 1956   KETONESUR NEGATIVE 05/18/2016 2110   PROTEINUR >=300 (A) 05/18/2016 2110   UROBILINOGEN 0.2 09/30/2014 1730   NITRITE NEGATIVE 05/18/2016 2110   LEUKOCYTESUR NEGATIVE 05/18/2016 2110   Sepsis Labs: @LABRCNTIP (procalcitonin:4,lacticidven:4)  ) Recent  Results (from the past 240 hour(s))  MRSA PCR Screening     Status: None   Collection Time: 03/01/17 11:30 PM  Result Value Ref Range Status   MRSA by PCR NEGATIVE NEGATIVE Final    Comment:        The GeneXpert MRSA Assay (FDA approved for NASAL specimens only), is one component of a comprehensive MRSA colonization surveillance program. It is not intended to diagnose MRSA infection nor to guide or monitor treatment for MRSA infections. Performed at [facility], [address] [place], [place] [address]       Studies: No results found.  Scheduled Meds: . aspirin EC  81 mg Oral Daily  . atorvastatin  80 mg Oral q1800  . calcitRIOL  1.25 mcg Oral Q M,W,F-HD  . calcium acetate  2,001 mg Oral TID WC  . cinacalcet  30 mg Oral Q M,W,F  . citalopram  40 mg Oral QHS  . dextromethorphan-guaiFENesin  1 tablet Oral BID  . doxycycline  100 mg Oral Q12H  . fluticasone  1 spray Each Nare Daily  . gabapentin  200 mg Oral TID  . heparin  5,000 Units Subcutaneous Q8H  . insulin aspart  0-5 Units Subcutaneous QHS  . insulin aspart  0-9 Units Subcutaneous TID WC  . insulin glargine  20 Units Subcutaneous QHS  . levothyroxine  50 mcg Oral QAC breakfast  . metoprolol succinate  100 mg Oral BID  . pantoprazole  40 mg Oral BID  . sodium chloride flush  3 mL Intravenous Q12H    Continuous Infusions: . sodium chloride    . ceFEPime (MAXIPIME) IV Stopped (03/06/17 1900)  . ferric gluconate (FERRLECIT/NULECIT) IV       LOS: 5 days     Briant Cedar,  MD [facility]   If 7PM-7AM, please contact night-coverage [email or web address] Password [facility] 03/07/2017, 6:42 PM

## 2017-03-07 NOTE — Progress Notes (Signed)
Pharmacy Antibiotic Note  Russell [name] is a 47 y.o. male admitted on 03/01/2017 with pneumonia seen on CT.  Pharmacy has been consulted for cefepime dosing - day #7. Vancomycin was stopped 3/2 d/t negative MRSA PCR.  Has hx of ESRD on HD MWF - on schedule. Pt is afebrile, WBC wnl.  Plan: Cefepime 1g IV every 24 hours Doxycycline 100mg  Q12hrs (MD dosing for atypical coverage) Monitor HD schedule, cx results, LOT  Temp (24hrs), Avg:97.7 F (36.5 C), Min:97.4 F (36.3 C), Max:98.1 F (36.7 C)  Recent Labs  Lab 03/01/17 1615 03/01/17 2049 03/02/17 0510 03/03/17 1041 03/05/17 0528 03/06/17 1812 03/07/17 0310  WBC  --  10.0 7.3 6.7 11.0* 7.6 8.8  CREATININE  --  7.15* 7.88* 6.09* 10.23* 8.36*  --   LATICACIDVEN 1.75  --   --   --   --   --   --     Estimated Creatinine Clearance: 15 mL/min (A) (by C-G formula based on SCr of 8.36 mg/dL (H)).    Antimicrobials this admission: Vancomycin 2/28 >> 3/2 Cefepime 2/28 >>  Azithro 3/1>3/2 (switched d/t QTc prolongation) Doxy 3/2>  Microbiology results: MRSA PCR negative  Russell [name], PharmD, BCPS Clinical Pharmacist Clinical phone for 03/07/2017 until 3:30pm: [phone] If after 3:30pm, please call main pharmacy at: [phone] 03/07/2017 11:25 AM

## 2017-03-08 ENCOUNTER — Inpatient Hospital Stay (HOSPITAL_COMMUNITY): Payer: Medicare Other

## 2017-03-08 LAB — CBC WITH DIFFERENTIAL/PLATELET
BASOS ABS: 0.1 10*3/uL (ref 0.0–0.1)
BASOS PCT: 1 %
Eosinophils Absolute: 0.3 10*3/uL (ref 0.0–0.7)
Eosinophils Relative: 4 %
HEMATOCRIT: 32.5 % — AB (ref 39.0–52.0)
HEMOGLOBIN: 10 g/dL — AB (ref 13.0–17.0)
LYMPHS PCT: 19 %
Lymphs Abs: 1.5 10*3/uL (ref 0.7–4.0)
MCH: 28.5 pg (ref 26.0–34.0)
MCHC: 30.8 g/dL (ref 30.0–36.0)
MCV: 92.6 fL (ref 78.0–100.0)
MONO ABS: 0.7 10*3/uL (ref 0.1–1.0)
MONOS PCT: 8 %
NEUTROS ABS: 5.6 10*3/uL (ref 1.7–7.7)
NEUTROS PCT: 68 %
Platelets: 282 10*3/uL (ref 150–400)
RBC: 3.51 MIL/uL — ABNORMAL LOW (ref 4.22–5.81)
RDW: 16 % — AB (ref 11.5–15.5)
WBC: 8.1 10*3/uL (ref 4.0–10.5)

## 2017-03-08 LAB — GLUCOSE, CAPILLARY
GLUCOSE-CAPILLARY: 164 mg/dL — AB (ref 65–99)
Glucose-Capillary: 96 mg/dL (ref 65–99)
Glucose-Capillary: 135 mg/dL — ABNORMAL HIGH (ref 65–99)
Glucose-Capillary: 170 mg/dL — ABNORMAL HIGH (ref 65–99)

## 2017-03-08 LAB — BASIC METABOLIC PANEL
ANION GAP: 10 (ref 5–15)
BUN: 33 mg/dL — ABNORMAL HIGH (ref 6–20)
CHLORIDE: 93 mmol/L — AB (ref 101–111)
CO2: 29 mmol/L (ref 22–32)
Calcium: 8.3 mg/dL — ABNORMAL LOW (ref 8.9–10.3)
Creatinine, Ser: 5.77 mg/dL — ABNORMAL HIGH (ref 0.61–1.24)
GFR calc non Af Amer: 11 mL/min — ABNORMAL LOW (ref >60)
GFR, EST AFRICAN AMERICAN: 12 mL/min — AB (ref >60)
GLUCOSE: 129 mg/dL — AB (ref 65–99)
Potassium: 3.9 mmol/L (ref 3.5–5.1)
Sodium: 132 mmol/L — ABNORMAL LOW (ref 135–145)

## 2017-03-08 MED ORDER — MORPHINE SULFATE (PF) 2 MG/ML IV SOLN
2.0000 mg | Freq: Once | INTRAVENOUS | Status: AC
  Administered 2017-03-09: 19:00:00 2 mg via INTRAVENOUS
  Filled 2017-03-08: qty 1

## 2017-03-08 MED ORDER — HYDROMORPHONE HCL 2 MG PO TABS
2.0000 mg | ORAL_TABLET | ORAL | Status: DC | PRN
Start: 2017-03-08 — End: 2017-03-13
  Administered 2017-03-09 – 2017-03-13 (×8): 2 mg via ORAL
  Filled 2017-03-08 (×8): qty 1

## 2017-03-08 MED ORDER — HEPARIN SODIUM (PORCINE) 1000 UNIT/ML DIALYSIS: 8000.0000 [IU] | Freq: Once | INTRAMUSCULAR | Status: DC

## 2017-03-08 NOTE — Progress Notes (Signed)
PROGRESS NOTE  Russell [name] ZOX:[id number] DOB: 1970-08-19 DOA: 03/01/2017 PCP: [name], MD  HPI/Recap of past 24 hours: Russell Sheffield Silveris a 47 y.o.male with pmhx significant for anxiety, asthma, CAD, DVT, depression, gastroparesis, DM II, CIDP,  ESRD who presents with dyspnea & orthopnea X 2 weeks. CTA showed CAP. ECHO showed EF of 30% reduced from previous. Cardiology was consulted and recommended cath. Cardiac cath showed stable CA, suspect reduce EF is related to volume-macrovascular ischemia. Pt admitted for further management.  Today, pt appears to be more comfortable, still reporting feeling of food stuck in his throat. Denies any chest pain, worsening cough, abdominal pain, N/V, fever/chills.   Assessment/Plan: Active Problems:   Hypothyroidism (acquired)   Diabetic neuropathy (HCC)   Uncontrolled diabetes mellitus type 2 with peripheral artery disease (HCC)   Morbid obesity (HCC)   Essential hypertension, benign   ESRD on dialysis (HCC)   History of DVT of lower extremity   Quadriparesis (HCC)   CIDP (chronic inflammatory demyelinating polyneuropathy) (HCC)   CAP (community acquired pneumonia)  CAP with lung mass on CT Afebrile with no leukocytosis CT chest with PNA Continue doxycycline 6/7 days, completed 7 days of cefepime, continue nebs Of note patient was evaluated by Dr Isaiah Serge, pulmonologist 2-28 for lung mass, follow up, will need PET scan in 4-5 weeks  Speech evaluation, recommend regular diet, with thin liquid DG esophagram done 3/7 showed: poor esophageal motility without stricture or mass with Mild silent aspiration. SLP on board Follow up with GI as an outpt  Acute on chronic systolic & diastolic HF Improving ECHO with worsening EF 25-30 % prior at 65 %. Cardiology on board: As pt is ESRD, pt will need more aggressive HD, strict fluid restriction  CAD/ hx of CABG/chest pain CT angio negative for PE Troponin times 2 negative.  Cath 03/06/17: Stable  CAD, severely diseased LAD, D1 and RI, widely patent grafts, normal RCA Continue with aspirin, Lipitor, metoprolol  ESRD on HD Nephrology on board  Fluid management by HD  HTN Continue with Hydralazine, metoprolol.  DM type 2 Continue with lantus and SSI.   HLD Statins   Depression/Anxiety Cont celexa No SI/HI Prn klonopin & atarax  Hypothyroidism Continue with synthroid   CIDP Follows with Dr Anne Hahn. He gets IVIG prn  PT/OT  Gastroparesis Stable Reglan TID PRN  Chronic pain Change dilaudid to PRN.   Prolong QT Change azithro to doxy  Improved to 480     Code Status: Full  Family Communication: None at bedside  Disposition Plan: Once SOB improves with HD    Consultants:  Cardiology  Nephrology  Procedures:  Cardiac cath  Antimicrobials:  S/P IV Cefepime  Doxycycline   DVT prophylaxis:  [place] heparin   Objective: Vitals:   03/08/17 0604 03/08/17 0700 03/08/17 1100 03/08/17 1523  BP: 103/63 (!) 117/53 (!) 160/63 (!) 146/79  Pulse: 64 65 69 65  Resp: 17 10  12   Temp: 97.9 F (36.6 C) 97.6 F (36.4 C) 97.8 F (36.6 C) 97.7 F (36.5 C)  TempSrc: Oral Oral Oral Oral  SpO2: 100% 99% 98% 97%  Weight: 115 kg (253 lb 8.5 oz)     Height:        Intake/Output Summary (Last 24 hours) at 03/08/2017 1728 Last data filed at 03/08/2017 1500 Gross per 24 hour  Intake 1040 ml  Output 0 ml  Net 1040 ml   Filed Weights   03/07/17 1045 03/07/17 1530 03/08/17 0604  Weight: 119 kg (262 lb 5.6 oz) 113.9 kg (251 lb 1.7 oz) 115 kg (253 lb 8.5 oz)    Exam:   General:  NAD, obese  Cardiovascular: RRR, S1, S2   Respiratory: CTA   Abdomen: Soft, obese, NT, ND, BS+  Musculoskeletal: No pedal edema b/l  Skin: Normal  Psychiatry: Normal affect   Data Reviewed: CBC: Recent Labs  Lab 03/02/17 0510 03/03/17 1041 03/05/17 0528 03/06/17 1812 03/07/17 0310 03/08/17 0341  WBC 7.3 6.7 11.0* 7.6 8.8 8.1  NEUTROABS 5.4  --   --   --    --  5.6  HGB 11.3* 12.3* 10.4* 10.0* 9.3* 10.0*  HCT 34.9* 37.8* 33.0* 32.3* 29.6* 32.5*  MCV 90.4 91.3 92.7 93.1 92.8 92.6  PLT 263 145* 259 272 251 282   Basic Metabolic Panel: Recent Labs  Lab 03/01/17 2049 03/02/17 0510 03/03/17 1041 03/05/17 0528 03/06/17 1812 03/07/17 1109 03/08/17 0341  NA  --  132* 132* 130*  --  131* 132*  K  --  4.0 3.9 4.5  --  4.9 3.9  CL  --  92* 91* 93*  --  97* 93*  CO2  --  25 26 21*  --  20* 29  GLUCOSE  --  256* 232* 249*  --  171* 129*  BUN  --  37* 29* 73*  --  71* 33*  CREATININE 7.15* 7.88* 6.09* 10.23* 8.36* 9.11* 5.77*  CALCIUM  --  8.9 9.4 9.1  --  8.5* 8.3*  MG 2.0  --   --   --   --   --   --   PHOS 6.2*  --   --   --   --  6.8*  --    GFR: Estimated Creatinine Clearance: 21.6 mL/min (A) (by C-G formula based on SCr of 5.77 mg/dL (H)). Liver Function Tests: Recent Labs  Lab 03/07/17 1109  ALBUMIN 3.1*   No results for input(s): LIPASE, AMYLASE in the last 168 hours. No results for input(s): AMMONIA in the last 168 hours. Coagulation Profile: Recent Labs  Lab 03/06/17 0459  INR 1.18   Cardiac Enzymes: No results for input(s): CKTOTAL, CKMB, CKMBINDEX, TROPONINI in the last 168 hours. BNP (last 3 results) No results for input(s): PROBNP in the last 8760 hours. HbA1C: No results for input(s): HGBA1C in the last 72 hours. CBG: Recent Labs  Lab 03/07/17 1708 03/07/17 2141 03/08/17 0611 03/08/17 1142 03/08/17 1712  GLUCAP 115* 228* 96 135* 164*   Lipid Profile: No results for input(s): CHOL, HDL, LDLCALC, TRIG, CHOLHDL, LDLDIRECT in the last 72 hours. Thyroid Function Tests: No results for input(s): TSH, T4TOTAL, FREET4, T3FREE, THYROIDAB in the last 72 hours. Anemia Panel: No results for input(s): VITAMINB12, FOLATE, FERRITIN, TIBC, IRON, RETICCTPCT in the last 72 hours. Urine analysis:    Component Value Date/Time   COLORURINE YELLOW 05/18/2016 2110   APPEARANCEUR HAZY (A) 05/18/2016 2110   LABSPEC 1.018  05/18/2016 2110   PHURINE 7.0 05/18/2016 2110   GLUCOSEU >=500 (A) 05/18/2016 2110   HGBUR NEGATIVE 05/18/2016 2110   BILIRUBINUR NEGATIVE 05/18/2016 2110   BILIRUBINUR neg 05/26/2014 1956   KETONESUR NEGATIVE 05/18/2016 2110   PROTEINUR >=300 (A) 05/18/2016 2110   UROBILINOGEN 0.2 09/30/2014 1730   NITRITE NEGATIVE 05/18/2016 2110   LEUKOCYTESUR NEGATIVE 05/18/2016 2110   Sepsis Labs: @LABRCNTIP (procalcitonin:4,lacticidven:4)  ) Recent Results (from the past 240 hour(s))  MRSA PCR Screening     Status: None   Collection Time:  03/01/17 11:30 PM  Result Value Ref Range Status   MRSA by PCR NEGATIVE NEGATIVE Final    Comment:        The GeneXpert MRSA Assay (FDA approved for NASAL specimens only), is one component of a comprehensive MRSA colonization surveillance program. It is not intended to diagnose MRSA infection nor to guide or monitor treatment for MRSA infections. Performed at [facility], [address] [place], [place] [address]       Studies: Dg Esophagus  Result Date: 03/08/2017 CLINICAL DATA:  Dysphagia.  Pneumonia EXAM: ESOPHOGRAM / BARIUM SWALLOW / BARIUM TABLET STUDY TECHNIQUE: Combined double contrast and single contrast examination performed using effervescent crystals, thick barium liquid, and thin barium liquid. The patient was observed with fluoroscopy swallowing a 13 mm barium sulphate tablet. FLUOROSCOPY TIME:  Fluoroscopy Time:  2 minutes 0 second Radiation Exposure Index (if provided by the fluoroscopic device): Number of Acquired Spot Images: 0 COMPARISON:  Chest CT 03/01/2017 FINDINGS: Poor esophageal motility with numerous tertiary contractions noted. Negative for stricture or mass. Negative for hiatal hernia. Small amount of silent aspiration on 1 attempt of swallowing. Barium tablet initially became lodged in the mid esophagus and then passed with additional swallows of water. No stricture is seen in this area. IMPRESSION: Poor esophageal  motility without stricture or mass Mild silent aspiration. Electronically Signed   By: Marlan Palau M.D.   On: 03/08/2017 14:11    Scheduled Meds: . aspirin EC  81 mg Oral Daily  . atorvastatin  80 mg Oral q1800  . calcitRIOL  1.25 mcg Oral Q M,W,F-HD  . calcium acetate  2,001 mg Oral TID WC  . cinacalcet  30 mg Oral Q M,W,F  . citalopram  40 mg Oral QHS  . dextromethorphan-guaiFENesin  1 tablet Oral BID  . doxycycline  100 mg Oral Q12H  . fluticasone  1 spray Each Nare Daily  . gabapentin  200 mg Oral TID  . heparin  5,000 Units Subcutaneous Q8H  . [START ON 03/09/2017] heparin  8,000 Units Dialysis Once in dialysis  . insulin aspart  0-5 Units Subcutaneous QHS  . insulin aspart  0-9 Units Subcutaneous TID WC  . insulin glargine  20 Units Subcutaneous QHS  . levothyroxine  50 mcg Oral QAC breakfast  . metoprolol succinate  100 mg Oral BID  . pantoprazole  40 mg Oral BID  . sodium chloride flush  3 mL Intravenous Q12H    Continuous Infusions: . sodium chloride    . ferric gluconate (FERRLECIT/NULECIT) IV       LOS: 6 days     Briant Cedar, MD [facility]   If 7PM-7AM, please contact night-coverage [email or web address] Password [facility] 03/08/2017, 5:28 PM

## 2017-03-08 NOTE — Progress Notes (Signed)
Subjective:  Stable, no c/o  Objective Vital signs in last 24 hours: Vitals:   03/07/17 2210 03/07/17 2221 03/08/17 0604 03/08/17 0700  BP: 139/66  103/63 (!) 117/53  Pulse: 67  64 65  Resp: 15 14 17 10   Temp:   97.9 F (36.6 C) 97.6 F (36.4 C)  TempSrc:   Oral Oral  SpO2:   100% 99%  Weight:   115 kg (253 lb 8.5 oz)   Height:       Weight change: 2 kg (4 lb 6.6 oz)   Physical Exam: General:  on hd, alert, obese WM NAD  Heart: RRR,  1/6sem , No  r, g Lungs: CTA , unlabored breathing  Abdomen: obes, soft , Nt, ND Extremities: no pedal edema  Dialysis Access: pos bruit LUA AVF    Dialysis: [facility] MWF 4h 112kg 2/2.25 bath Hep 8000 +2000 midrun LFA AVF -Calcitriol 1.87mcg po/HD -Mircera 50 q 2wks (last on 02/28/17) -Venofer 50mg  q wkly   Problem/Plan: 1.  Dyspnea Multifactorial= possible PNA, mainly vol overload from heart cath is only sig finding , see cath/ cards notes 2. ESRD - HD MWF.  HD tomorrow, UF 5-6 L and lower dry wt 3. CM EF 25-30% =lower than 4/5 months ago. Heart cath shows patient grafts/ stents but high filling pressures.  Per cards volume management main goal.  4. HTN on MTP/ hydral at home, just MTP here 5. Volume - up 3kg but may need sig lowering of dry wt due to cath findings (see #3) 6. Anemia -hgb 10.0  No esa ( last given 02/28/17 )  Weekly venofer on hd  Follow up hgb  trend  7. Secondary hyperparathyroidism - phos 6.2 / ca 9.4 on phoslo and sensipar, vdra 8. Dm type 2 - per admit  9. Depression /anxiety - Citalopram  10. Gatroparesis = TID Reglan    [name] MD [employer] pgr [phone]   03/08/2017, 12:43 PM        Labs: Basic Metabolic Panel: Recent Labs  Lab 03/01/17 1502 03/01/17 2049  03/05/17 0528 03/06/17 1812 03/07/17 1109 03/08/17 0341  NA  --   --    < > 130*  --  131* 132*  K  --   --    < > 4.5  --  4.9 3.9  CL  --   --    < > 93*  --  97* 93*  CO2  --   --    < > 21*   --  20* 29  GLUCOSE  --   --    < > 249*  --  171* 129*  BUN  --   --    < > 73*  --  71* 33*  CREATININE  --  7.15*   < > 10.23* 8.36* 9.11* 5.77*  CALCIUM  --   --    < > 9.1  --  8.5* 8.3*  PHOS 6.8* 6.2*  --   --   --  6.8*  --    < > = values in this interval not displayed.   Liver Function Tests: Recent Labs  Lab 03/01/17 1312 03/07/17 1109  AST 21  --   ALT 14*  --   ALKPHOS 82  --   BILITOT 0.8  --   PROT 7.9  --   ALBUMIN 3.7 3.1*   Recent Labs  Lab 03/01/17 1312  LIPASE 45   No results for input(s):  AMMONIA in the last 168 hours. CBC: Recent Labs  Lab 03/02/17 0510 03/03/17 1041 03/05/17 0528 03/06/17 1812 03/07/17 0310 03/08/17 0341  WBC 7.3 6.7 11.0* 7.6 8.8 8.1  NEUTROABS 5.4  --   --   --   --  5.6  HGB 11.3* 12.3* 10.4* 10.0* 9.3* 10.0*  HCT 34.9* 37.8* 33.0* 32.3* 29.6* 32.5*  MCV 90.4 91.3 92.7 93.1 92.8 92.6  PLT 263 145* 259 272 251 282   Cardiac Enzymes: No results for input(s): CKTOTAL, CKMB, CKMBINDEX, TROPONINI in the last 168 hours. CBG: Recent Labs  Lab 03/07/17 0655 03/07/17 1708 03/07/17 2141 03/08/17 0611 03/08/17 1142  GLUCAP 132* 115* 228* 96 135*    Studies/Results: No results found. Medications: . sodium chloride    . ceFEPime (MAXIPIME) IV Stopped (03/07/17 2157)  . ferric gluconate (FERRLECIT/NULECIT) IV     . aspirin EC  81 mg Oral Daily  . atorvastatin  80 mg Oral q1800  . calcitRIOL  1.25 mcg Oral Q M,W,F-HD  . calcium acetate  2,001 mg Oral TID WC  . cinacalcet  30 mg Oral Q M,W,F  . citalopram  40 mg Oral QHS  . dextromethorphan-guaiFENesin  1 tablet Oral BID  . doxycycline  100 mg Oral Q12H  . fluticasone  1 spray Each Nare Daily  . gabapentin  200 mg Oral TID  . heparin  5,000 Units Subcutaneous Q8H  . insulin aspart  0-5 Units Subcutaneous QHS  . insulin aspart  0-9 Units Subcutaneous TID WC  . insulin glargine  20 Units Subcutaneous QHS  . levothyroxine  50 mcg Oral QAC breakfast  . metoprolol  succinate  100 mg Oral BID  . pantoprazole  40 mg Oral BID  . sodium chloride flush  3 mL Intravenous Q12H

## 2017-03-08 NOTE — Care Management Important Message (Signed)
Important Message  Patient Details  Name: Russell [name] MRN: [id number] Date of Birth: [birth year]   Medicare Important Message Given:  Yes    Jewelia Bocchino P Numa Schroeter 03/08/2017, 1:17 PM

## 2017-03-08 NOTE — Consult Note (Signed)
   [facility] CM Inpatient Consult   03/08/2017  CALAHAN PAK 1970/08/03 [id number]  Referral received for medication management/assistance with cost.  Patient is in the Medicare ACO of the [place]. Met with the patient at the bedside. Patient's HgA1C is 11.9% in January 2019.  Met with the patient at the bedside. Explained to the patient regarding referral for diabetes medications and follow up.  Patient states, "I just have not been able to afford the two types of insulin.  They are trying me on new medicines but it's not controlling my blood sugar as well.  Will refer patient to [facility] Pharmacist.  Consent signed.  Folder with [facility] Care management information given.    For questions,  please contact:   Natividad Brood, RN BSN [facility] Liaison  [phone] business mobile phone Toll free office [phone]

## 2017-03-09 ENCOUNTER — Other Ambulatory Visit: Payer: Self-pay | Admitting: Family Medicine

## 2017-03-09 ENCOUNTER — Inpatient Hospital Stay (HOSPITAL_COMMUNITY): Payer: Medicare Other

## 2017-03-09 LAB — CBC WITH DIFFERENTIAL/PLATELET
BASOS PCT: 1 %
Basophils Absolute: 0 10*3/uL (ref 0.0–0.1)
Eosinophils Absolute: 0.2 10*3/uL (ref 0.0–0.7)
Eosinophils Relative: 3 %
HEMATOCRIT: 32.4 % — AB (ref 39.0–52.0)
Hemoglobin: 10.3 g/dL — ABNORMAL LOW (ref 13.0–17.0)
LYMPHS ABS: 1.4 10*3/uL (ref 0.7–4.0)
Lymphocytes Relative: 21 %
MCH: 29.6 pg (ref 26.0–34.0)
MCHC: 31.8 g/dL (ref 30.0–36.0)
MCV: 93.1 fL (ref 78.0–100.0)
MONO ABS: 0.5 10*3/uL (ref 0.1–1.0)
MONOS PCT: 8 %
NEUTROS ABS: 4.5 10*3/uL (ref 1.7–7.7)
Neutrophils Relative %: 67 %
Platelets: 279 10*3/uL (ref 150–400)
RBC: 3.48 MIL/uL — ABNORMAL LOW (ref 4.22–5.81)
RDW: 16.2 % — AB (ref 11.5–15.5)
WBC: 6.7 10*3/uL (ref 4.0–10.5)

## 2017-03-09 LAB — BASIC METABOLIC PANEL
Anion gap: 13 (ref 5–15)
BUN: 52 mg/dL — AB (ref 6–20)
CHLORIDE: 91 mmol/L — AB (ref 101–111)
CO2: 28 mmol/L (ref 22–32)
CREATININE: 7.89 mg/dL — AB (ref 0.61–1.24)
Calcium: 9.1 mg/dL (ref 8.9–10.3)
GFR calc Af Amer: 8 mL/min — ABNORMAL LOW (ref >60)
GFR calc non Af Amer: 7 mL/min — ABNORMAL LOW (ref >60)
GLUCOSE: 157 mg/dL — AB (ref 65–99)
Potassium: 4.7 mmol/L (ref 3.5–5.1)
Sodium: 132 mmol/L — ABNORMAL LOW (ref 135–145)

## 2017-03-09 LAB — GLUCOSE, CAPILLARY
GLUCOSE-CAPILLARY: 99 mg/dL (ref 65–99)
Glucose-Capillary: 153 mg/dL — ABNORMAL HIGH (ref 65–99)
Glucose-Capillary: 216 mg/dL — ABNORMAL HIGH (ref 65–99)
Glucose-Capillary: 174 mg/dL — ABNORMAL HIGH (ref 65–99)

## 2017-03-09 NOTE — Plan of Care (Signed)
Reviewed inpatient HD trmt orders and plan.  Also reviewed with patient fluid intake and self management of fluids (reinforced education from 3/06).  Also d/w pt blood sugar control and impact of elevated blood sugars on thirst.  Patient verbalized understanding.

## 2017-03-09 NOTE — Progress Notes (Signed)
Subjective:  Stable, no c/o  Objective Vital signs in last 24 hours: Vitals:   03/09/17 0930 03/09/17 1000 03/09/17 1030 03/09/17 1100  BP: 104/66 (!) 81/49 (!) 79/32 (!) 86/31  Pulse: 62 63 61 63  Resp: 15 16 16 16   Temp:      TempSrc:      SpO2:      Weight:      Height:       Weight change: -0.4 kg (-14.1 oz)   Physical Exam: General:  on hd, alert, obese WM NAD  Heart: RRR,  1/6sem , No  r, g Lungs: CTA , unlabored breathing  Abdomen: obes, soft , Nt, ND Extremities: no pedal edema  Dialysis Access: pos bruit LUA AVF    Dialysis: [facility] MWF 4h 15min 112kg 2/2.25 bath Hep 8000 +2000 midrun LFA AVF -Calcitriol 1.5925mcg po/HD -Mircera 50 q 2wks (last on 02/28/17) -Venofer 50mg  q wkly   Problem/Plan: 1. Dyspnea - mostly vol overload, shown by Lindi Adie^^lvedp at heart cath; other w/u negative. See cath/ cards notes 2. ESRD - HD MWF.  HD today, max UF , lower dry if possible 3. CM EF 25-30% =lower than 4/5 months ago. Heart cath shows patient grafts/ stents but high filling pressures.  4. HTN on MTP/ hydral at home, just MTP here 5. Volume - max UF today on HD, lower dry if poss 6. Anemia -hgb 10.0  No esa ( last given 02/28/17 )  Weekly venofer on hd  Follow up hgb  trend  7. Secondary hyperparathyroidism - phos 6.2 / ca 9.4 on phoslo and sensipar, vdra 8. Dm type 2 - per admit  9. Depression /anxiety - Citalopram  10. Gatroparesis = TID Reglan    Vinson Moselleob Nkechi Linehan MD [facility] pgr [phone]   03/09/2017, 12:08 PM        Labs: Basic Metabolic Panel: Recent Labs  Lab 03/07/17 1109 03/08/17 0341 03/09/17 0307  NA 131* 132* 132*  K 4.9 3.9 4.7  CL 97* 93* 91*  CO2 20* 29 28  GLUCOSE 171* 129* 157*  BUN 71* 33* 52*  CREATININE 9.11* 5.77* 7.89*  CALCIUM 8.5* 8.3* 9.1  PHOS 6.8*  --   --    Liver Function Tests: Recent Labs  Lab 03/07/17 1109  ALBUMIN 3.1*   No results for input(s): LIPASE, AMYLASE in the last 168  hours. No results for input(s): AMMONIA in the last 168 hours. CBC: Recent Labs  Lab 03/05/17 0528 03/06/17 1812 03/07/17 0310 03/08/17 0341 03/09/17 0307  WBC 11.0* 7.6 8.8 8.1 6.7  NEUTROABS  --   --   --  5.6 4.5  HGB 10.4* 10.0* 9.3* 10.0* 10.3*  HCT 33.0* 32.3* 29.6* 32.5* 32.4*  MCV 92.7 93.1 92.8 92.6 93.1  PLT 259 272 251 282 279   Cardiac Enzymes: No results for input(s): CKTOTAL, CKMB, CKMBINDEX, TROPONINI in the last 168 hours. CBG: Recent Labs  Lab 03/08/17 0611 03/08/17 1142 03/08/17 1712 03/08/17 2104 03/09/17 0606  GLUCAP 96 135* 164* 170* 153*    Studies/Results: Dg Esophagus  Result Date: 03/08/2017 CLINICAL DATA:  Dysphagia.  Pneumonia EXAM: ESOPHOGRAM / BARIUM SWALLOW / BARIUM TABLET STUDY TECHNIQUE: Combined double contrast and single contrast examination performed using effervescent crystals, thick barium liquid, and thin barium liquid. The patient was observed with fluoroscopy swallowing a 13 mm barium sulphate tablet. FLUOROSCOPY TIME:  Fluoroscopy Time:  2 minutes 0 second Radiation Exposure Index (if provided by the fluoroscopic device): Number of  Acquired Spot Images: 0 COMPARISON:  Chest CT 03/01/2017 FINDINGS: Poor esophageal motility with numerous tertiary contractions noted. Negative for stricture or mass. Negative for hiatal hernia. Small amount of silent aspiration on 1 attempt of swallowing. Barium tablet initially became lodged in the mid esophagus and then passed with additional swallows of water. No stricture is seen in this area. IMPRESSION: Poor esophageal motility without stricture or mass Mild silent aspiration. Electronically Signed   By: Marlan Palau M.D.   On: 03/08/2017 14:11   Medications: . sodium chloride    . ferric gluconate (FERRLECIT/NULECIT) IV     . aspirin EC  81 mg Oral Daily  . atorvastatin  80 mg Oral q1800  . calcitRIOL  1.25 mcg Oral Q M,W,F-HD  . calcium acetate  2,001 mg Oral TID WC  . cinacalcet  30 mg Oral Q  M,W,F  . citalopram  40 mg Oral QHS  . dextromethorphan-guaiFENesin  1 tablet Oral BID  . doxycycline  100 mg Oral Q12H  . fluticasone  1 spray Each Nare Daily  . gabapentin  200 mg Oral TID  . heparin  5,000 Units Subcutaneous Q8H  . heparin  8,000 Units Dialysis Once in dialysis  . insulin aspart  0-5 Units Subcutaneous QHS  . insulin aspart  0-9 Units Subcutaneous TID WC  . insulin glargine  20 Units Subcutaneous QHS  . levothyroxine  50 mcg Oral QAC breakfast  . metoprolol succinate  100 mg Oral BID  .  morphine injection  2 mg Intravenous Once  . pantoprazole  40 mg Oral BID  . sodium chloride flush  3 mL Intravenous Q12H

## 2017-03-09 NOTE — Progress Notes (Addendum)
  Speech Language Pathology Treatment: Dysphagia  Patient Details Name: Russell [name] MRN: [id number] DOB: 05-26-1970 Today's Date: 03/09/2017 Time: [id number] SLP Time Calculation (min) (ACUTE ONLY): 12 min  Assessment / Plan / Recommendation Clinical Impression  SLP reviewed results of esophagram with pt regarding occurrence of silent aspiration. SLP educated pt on need for further objective assessment to determine pharyngeal swallow function. Pt recalled esophageal precautions with min verbal cues; however, pt reports limited improvement using precautions. Globus sensation reported by pt during PO trials with regular solid and when taking pills provided by RN. No overt s/s concerning for aspiration were observed during PO trials. Further treatment intervention pending MBS scheduled for today @1530 .     HPI HPI: Russell [name] is a 47 y.o. male With pmhx significant for anxiety, asthma, CAD, DVT, depression, gastroparesis, severe erosive esophagitis, DM II who presented with SOB. He was recently diagnosed with CIDP. CTA 03/01/17 showed pna. Per MD notes, pt reports coughing while eating. EGD 03/29/14 findings of severe exudative circumferential esophagitis, appearance consistent with severe reflux.       SLP Plan  New goals to be determined pending instrumental study       Recommendations  Diet recommendations: Regular;Thin liquid Liquids provided via: Cup;Straw Medication Administration: Whole meds with liquid Supervision: Patient able to self feed Compensations: Slow rate;Small sips/bites;Follow solids with liquid Postural Changes and/or Swallow Maneuvers: Seated upright 90 degrees;Upright 30-60 min after meal                Oral Care Recommendations: Oral care BID Follow up Recommendations: Other (comment)(tbd) SLP Visit Diagnosis: Dysphagia, unspecified (R13.10) Plan: New goals to be determined pending instrumental study       GO              [place] Jakiyah Stepney SLP  Student Clinician   [place] Azrael Huss 03/09/2017, 2:44 PM

## 2017-03-09 NOTE — Progress Notes (Signed)
PT Cancellation Note  Patient Details Name: Fonnie BirkenheadSteven B Mendibles MRN: [id number] DOB: [birth year]   Cancelled Treatment:       PT one time evaluation completed on 03/04/17.  Please see that note.  PT is not following acutely.   Thanks,   Rollene Rotundaebecca B. Zakaiya Lares, PT, DPT [phone]   03/09/2017, 7:18 AM

## 2017-03-09 NOTE — Progress Notes (Signed)
PROGRESS NOTE  Russell [name]:[id number] DOB: [birth year] DOA: 03/01/2017 PCP: [name], MD  HPI/Recap of past 24 hours: Russell [name] a 47 y.o.male with pmhx significant for anxiety, asthma, CAD, DVT, depression, gastroparesis, DM II, CIDP,  ESRD who presents with dyspnea & orthopnea X 2 weeks. CTA showed CAP. ECHO showed EF of 30% reduced from previous. Cardiology was consulted and recommended cath. Cardiac cath showed stable CA, suspect reduce EF is related to volume-macrovascular ischemia. Pt admitted for further management.  Today, pt reports feeling better, denies any chest pain, worsening SOB/cough, abdominal pain, N/V, fever/chills.   Assessment/Plan: Active Problems:   Hypothyroidism (acquired)   Diabetic neuropathy (HCC)   Uncontrolled diabetes mellitus type 2 with peripheral artery disease (HCC)   Morbid obesity (HCC)   Essential hypertension, benign   ESRD on dialysis (HCC)   History of DVT of lower extremity   Quadriparesis (HCC)   CIDP (chronic inflammatory demyelinating polyneuropathy) (HCC)   CAP (community acquired pneumonia)  CAP with lung mass on CT Afebrile with no leukocytosis CT chest with PNA Continue doxycycline 7/7 days, completed 7 days of cefepime, continue nebs Of note patient was evaluated by Dr Isaiah Serge, pulmonologist 2-28 for lung mass, follow up, will need PET scan in 4-5 weeks  Speech evaluation, recommend regular diet, with thin liquid DG esophagram done 3/7 showed: poor esophageal motility without stricture or mass with Mild silent aspiration. 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 Cardiology on board: As pt is ESRD, pt will need more aggressive HD, strict fluid restriction  CAD/ hx of CABG/chest pain CT angio negative for PE Troponin times 2 negative.  Cath 03/06/17: Stable CAD, severely diseased LAD, D1 and RI, widely  patent grafts, normal RCA Continue with aspirin, Lipitor, metoprolol  ESRD on HD Nephrology on board  Fluid management by HD  HTN Continue with Hydralazine prn, metoprolol.  DM type 2 Continue with lantus and SSI.   HLD Statins   Depression/Anxiety Cont celexa No SI/HI Prn klonopin & atarax  Hypothyroidism Continue with synthroid   CIDP Follows with Dr Anne Hahn. He gets IVIG prn  PT/OT  Gastroparesis Stable Reglan TID PRN  Chronic pain Change dilaudid to PRN.   Prolong QT Change azithro to doxy  Improved to 480     Code Status: Full  Family Communication: None at bedside  Disposition Plan: Once SOB improves with HD, likely 03/10/17    Consultants:  Cardiology  Nephrology  Procedures:  Cardiac cath  Antimicrobials:  S/P IV Cefepime  Doxycycline   DVT prophylaxis:  [place] heparin   Objective: Vitals:   03/09/17 1230 03/09/17 1250 03/09/17 1330 03/09/17 1500  BP: (!) 87/43 113/82 125/75 95/87  Pulse: 65 63 67 72  Resp: 18 13 14 15   Temp:  97.8 F (36.6 C) 99.1 F (37.3 C)   TempSrc:  Oral Oral Oral  SpO2:  (!) 65% 96% 99%  Weight:  112.3 kg (247 lb 9.2 oz)    Height:        Intake/Output Summary (Last 24 hours) at 03/09/2017 1620 Last data filed at 03/09/2017 1500 Gross per 24 hour  Intake 720 ml  Output 4858 ml  Net -4138 ml   Filed Weights   03/09/17 0305 03/09/17 0800 03/09/17 1250  Weight: 118.6 kg (261 lb 7.5 oz) 117.4 kg (258 lb 13.1 oz)  112.3 kg (247 lb 9.2 oz)    Exam:   General:  NAD, obese  Cardiovascular: RRR, S1, S2   Respiratory: CTA   Abdomen: Soft, obese, NT, ND, BS+  Musculoskeletal: No pedal edema b/l  Skin: Normal  Psychiatry: Normal affect   Data Reviewed: CBC: Recent Labs  Lab 03/05/17 0528 03/06/17 1812 03/07/17 0310 03/08/17 0341 03/09/17 0307  WBC 11.0* 7.6 8.8 8.1 6.7  NEUTROABS  --   --   --  5.6 4.5  HGB 10.4* 10.0* 9.3* 10.0* 10.3*  HCT 33.0* 32.3* 29.6* 32.5* 32.4*  MCV  92.7 93.1 92.8 92.6 93.1  PLT 259 272 251 282 279   Basic Metabolic Panel: Recent Labs  Lab 03/03/17 1041 03/05/17 0528 03/06/17 1812 03/07/17 1109 03/08/17 0341 03/09/17 0307  NA 132* 130*  --  131* 132* 132*  K 3.9 4.5  --  4.9 3.9 4.7  CL 91* 93*  --  97* 93* 91*  CO2 26 21*  --  20* 29 28  GLUCOSE 232* 249*  --  171* 129* 157*  BUN 29* 73*  --  71* 33* 52*  CREATININE 6.09* 10.23* 8.36* 9.11* 5.77* 7.89*  CALCIUM 9.4 9.1  --  8.5* 8.3* 9.1  PHOS  --   --   --  6.8*  --   --    GFR: Estimated Creatinine Clearance: 15.6 mL/min (A) (by C-G formula based on SCr of 7.89 mg/dL (H)). Liver Function Tests: Recent Labs  Lab 03/07/17 1109  ALBUMIN 3.1*   No results for input(s): LIPASE, AMYLASE in the last 168 hours. No results for input(s): AMMONIA in the last 168 hours. Coagulation Profile: Recent Labs  Lab 03/06/17 0459  INR 1.18   Cardiac Enzymes: No results for input(s): CKTOTAL, CKMB, CKMBINDEX, TROPONINI in the last 168 hours. BNP (last 3 results) No results for input(s): PROBNP in the last 8760 hours. HbA1C: No results for input(s): HGBA1C in the last 72 hours. CBG: Recent Labs  Lab 03/08/17 1142 03/08/17 1712 03/08/17 2104 03/09/17 0606 03/09/17 1333  GLUCAP 135* 164* 170* 153* 99   Lipid Profile: No results for input(s): CHOL, HDL, LDLCALC, TRIG, CHOLHDL, LDLDIRECT in the last 72 hours. Thyroid Function Tests: No results for input(s): TSH, T4TOTAL, FREET4, T3FREE, THYROIDAB in the last 72 hours. Anemia Panel: No results for input(s): VITAMINB12, FOLATE, FERRITIN, TIBC, IRON, RETICCTPCT in the last 72 hours. Urine analysis:    Component Value Date/Time   COLORURINE YELLOW 05/18/2016 2110   APPEARANCEUR HAZY (A) 05/18/2016 2110   LABSPEC 1.018 05/18/2016 2110   PHURINE 7.0 05/18/2016 2110   GLUCOSEU >=500 (A) 05/18/2016 2110   HGBUR NEGATIVE 05/18/2016 2110   BILIRUBINUR NEGATIVE 05/18/2016 2110   BILIRUBINUR neg 05/26/2014 1956   KETONESUR  NEGATIVE 05/18/2016 2110   PROTEINUR >=300 (A) 05/18/2016 2110   UROBILINOGEN 0.2 09/30/2014 1730   NITRITE NEGATIVE 05/18/2016 2110   LEUKOCYTESUR NEGATIVE 05/18/2016 2110   Sepsis Labs: @LABRCNTIP (procalcitonin:4,lacticidven:4)  ) Recent Results (from the past 240 hour(s))  MRSA PCR Screening     Status: None   Collection Time: 03/01/17 11:30 PM  Result Value Ref Range Status   MRSA by PCR NEGATIVE NEGATIVE Final    Comment:        The GeneXpert MRSA Assay (FDA approved for NASAL specimens only), is one component of a comprehensive MRSA colonization surveillance program. It is not intended to diagnose MRSA infection nor to guide or monitor treatment for MRSA infections. Performed at [facility], [address] [place], [place] [address]       Studies: No results found.  Scheduled Meds: . aspirin EC  81 mg Oral Daily  . atorvastatin  80 mg Oral q1800  . calcitRIOL  1.25 mcg Oral Q M,W,F-HD  . calcium acetate  2,001 mg Oral TID WC  . cinacalcet  30 mg Oral Q M,W,F  . citalopram  40 mg Oral QHS  . dextromethorphan-guaiFENesin  1 tablet Oral BID  . doxycycline  100 mg Oral Q12H  . fluticasone  1 spray Each Nare Daily  . gabapentin  200 mg Oral TID  . heparin  5,000 Units Subcutaneous Q8H  . insulin aspart  0-5 Units Subcutaneous QHS  . insulin aspart  0-9 Units Subcutaneous TID WC  . insulin glargine  20 Units Subcutaneous QHS  . levothyroxine  50 mcg Oral QAC breakfast  . metoprolol succinate  100 mg Oral BID  .  morphine injection  2 mg Intravenous Once  . pantoprazole  40 mg Oral BID  . sodium chloride flush  3 mL Intravenous Q12H    Continuous Infusions: . sodium chloride    . ferric gluconate (FERRLECIT/NULECIT) IV       LOS: 7 days     Briant CedarNkeiruka J Norman Piacentini, MD [facility]   If 7PM-7AM, please contact night-coverage [email or web address] Password [email or web address] 03/09/2017, 4:20 PM

## 2017-03-09 NOTE — Telephone Encounter (Signed)
Medication is not on pt's current medication list.   LOV:08/21/16  Dr. Clelia CroftShaw  [facility] [place]

## 2017-03-09 NOTE — Progress Notes (Signed)
Modified Barium Swallow Progress Note  Patient Details  Name: Russell [name] MRN: [id number] Date of Birth: 1970/07/30  Today's Date: 03/09/2017  Modified Barium Swallow completed.  Full report located under Chart Review in the Imaging Section.  Brief recommendations include the following:  Clinical Impression   Pt presents with one occurrence of slight penetration with initial, larger sip of thin liquid. Partial amount of penetrates cleared from laryngeal spontaneously, with remainder cleared following cued throat clear from SLP. No other observed airway compromise observed despite challenging with consecutive sips with straw and using mixed consistencies (pill and thin liquid). Pt had mild amount of vallecular residue with solid trials that he cleared spontaneously with subsequent swallows. Oral phase was significant only for min premature spillage with thin liquids. Esophagus scan during pill trial revealed suspected reduced pill propulsion through esophagus consistent with results from esophagram yesterday. Recommend continuing regular solids and thin liquid diet with aspiration and esophageal precautions. Recommend possible follow up with GI. SLP services no longer indicated, please re-consult in the event of acute changes.    Swallow Evaluation Recommendations   Recommended Consults: Consider GI evaluation   SLP Diet Recommendations: Regular solids;Thin liquid   Liquid Administration via: Straw;Cup   Medication Administration: Whole meds with liquid(as tolerated)   Supervision: Patient able to self feed;Intermittent supervision to cue for compensatory strategies   Compensations: Slow rate;Small sips/bites;Follow solids with liquid   Postural Changes: Seated upright at 90 degrees;Remain semi-upright after after feeds/meals (Comment)   Oral Care Recommendations: Oral care BID      [place] Nicanor [name] SLP Student Clinician   [place] Quantrell [name] 03/09/2017,4:24 PM

## 2017-03-09 NOTE — Progress Notes (Signed)
Pain control for this patient has not been effective.  One time morphine dose this evening was quite effective, bringing pain levels from 7-8/10 down to 3/10.

## 2017-03-10 LAB — BASIC METABOLIC PANEL
Anion gap: 15 (ref 5–15)
BUN: 33 mg/dL — AB (ref 6–20)
CALCIUM: 9.7 mg/dL (ref 8.9–10.3)
CO2: 29 mmol/L (ref 22–32)
CREATININE: 6.04 mg/dL — AB (ref 0.61–1.24)
Chloride: 90 mmol/L — ABNORMAL LOW (ref 101–111)
GFR calc Af Amer: 12 mL/min — ABNORMAL LOW (ref >60)
GFR calc non Af Amer: 10 mL/min — ABNORMAL LOW (ref >60)
Glucose, Bld: 165 mg/dL — ABNORMAL HIGH (ref 65–99)
Potassium: 4.8 mmol/L (ref 3.5–5.1)
SODIUM: 134 mmol/L — AB (ref 135–145)

## 2017-03-10 LAB — GLUCOSE, CAPILLARY
GLUCOSE-CAPILLARY: 154 mg/dL — AB (ref 65–99)
Glucose-Capillary: 224 mg/dL — ABNORMAL HIGH (ref 65–99)
Glucose-Capillary: 147 mg/dL — ABNORMAL HIGH (ref 65–99)
Glucose-Capillary: 225 mg/dL — ABNORMAL HIGH (ref 65–99)

## 2017-03-10 LAB — CBC WITH DIFFERENTIAL/PLATELET
Basophils Absolute: 0 10*3/uL (ref 0.0–0.1)
Basophils Relative: 1 %
EOS PCT: 3 %
Eosinophils Absolute: 0.2 10*3/uL (ref 0.0–0.7)
HCT: 36.6 % — ABNORMAL LOW (ref 39.0–52.0)
HEMOGLOBIN: 11.9 g/dL — AB (ref 13.0–17.0)
LYMPHS ABS: 1.3 10*3/uL (ref 0.7–4.0)
LYMPHS PCT: 18 %
MCH: 30.1 pg (ref 26.0–34.0)
MCHC: 32.5 g/dL (ref 30.0–36.0)
MCV: 92.7 fL (ref 78.0–100.0)
MONOS PCT: 8 %
Monocytes Absolute: 0.6 10*3/uL (ref 0.1–1.0)
NEUTROS PCT: 70 %
Neutro Abs: 4.9 10*3/uL (ref 1.7–7.7)
Platelets: 304 10*3/uL (ref 150–400)
RBC: 3.95 MIL/uL — ABNORMAL LOW (ref 4.22–5.81)
RDW: 16.4 % — ABNORMAL HIGH (ref 11.5–15.5)
WBC: 7 10*3/uL (ref 4.0–10.5)

## 2017-03-10 MED ORDER — METOPROLOL SUCCINATE ER 25 MG PO TB24
25.0000 mg | ORAL_TABLET | Freq: Two times a day (BID) | ORAL | Status: DC
  Administered 2017-03-10 – 2017-03-12 (×4): 25 mg via ORAL
  Filled 2017-03-10 (×5): qty 1

## 2017-03-10 MED ORDER — HEPARIN SODIUM (PORCINE) 1000 UNIT/ML DIALYSIS
7000.0000 [IU] | Freq: Once | INTRAMUSCULAR | Status: DC
  Filled 2017-03-10: qty 7

## 2017-03-10 NOTE — Progress Notes (Signed)
Subjective:  Stable, no c/o;, states SOB is better by about 50%  Objective Vital signs in last 24 hours: Vitals:   03/10/17 0713 03/10/17 0715 03/10/17 0815 03/10/17 1000  BP: (!) 103/53     Pulse: 63     Resp: 11 13 11    Temp: 97.8 F (36.6 C)     TempSrc: Oral     SpO2: 96%     Weight:    113.3 kg (249 lb 12.5 oz)  Height:       Weight change: -1.2 kg (-10.3 oz)   Physical Exam: General:  on hd, alert, obese WM NAD  Heart: RRR,  1/6sem , No  r, g Lungs: CTA , unlabored breathing  Abdomen: obes, soft , Nt, ND Extremities: no pedal edema  Dialysis Access: pos bruit LUA AVF    Dialysis: [facility] MWF 4h 112kg 2/2.25 bath Hep 8000 +2000 midrun LFA AVF -Calcitriol 1.17mcg po/HD -Mircera 50 q 2wks (last on 02/28/17) -Venofer 50mg  q wkly   Assessment: 1. Dyspnea - mostly vol overload, shown by Russell [name] at heart cath; other w/u negative. See cath/ cards notes 2. ESRD - HD MWF.  Extra HD today for volume.  3. CM EF 25-30% =lower than 4/5 months ago. Heart cath shows patient grafts/ stents but high filling pressures.  4. HTN - metop/ hydral at home, on MTP xl 100 bid now, but need more BP for vol removal. Have decreased MTP xl to 25 bid 5. Volume - as above 6. Anemia -hgb 10.0  No esa ( last given 02/28/17 )  Weekly venofer on hd  Follow up hgb  trend  7. Secondary hyperparathyroidism - phos 6.2 / ca 9.4 on phoslo and sensipar, vdra 8. Dm type 2 - per admit  9. Depression /anxiety - Citalopram  10. Gatroparesis = TID Reglan   PLan - as above   [name] MD [employer] pgr [phone]   03/10/2017, 11:07 AM        Labs: Basic Metabolic Panel: Recent Labs  Lab 03/07/17 1109 03/08/17 0341 03/09/17 0307 03/10/17 0337  NA 131* 132* 132* 134*  K 4.9 3.9 4.7 4.8  CL 97* 93* 91* 90*  CO2 20* 29 28 29   GLUCOSE 171* 129* 157* 165*  BUN 71* 33* 52* 33*  CREATININE 9.11* 5.77* 7.89* 6.04*  CALCIUM 8.5* 8.3* 9.1 9.7  PHOS 6.8*   --   --   --    Liver Function Tests: Recent Labs  Lab 03/07/17 1109  ALBUMIN 3.1*   No results for input(s): LIPASE, AMYLASE in the last 168 hours. No results for input(s): AMMONIA in the last 168 hours. CBC: Recent Labs  Lab 03/05/17 0528 03/06/17 1812 03/07/17 0310 03/08/17 0341 03/09/17 0307  WBC 11.0* 7.6 8.8 8.1 6.7  NEUTROABS  --   --   --  5.6 4.5  HGB 10.4* 10.0* 9.3* 10.0* 10.3*  HCT 33.0* 32.3* 29.6* 32.5* 32.4*  MCV 92.7 93.1 92.8 92.6 93.1  PLT 259 272 251 282 279   Cardiac Enzymes: No results for input(s): CKTOTAL, CKMB, CKMBINDEX, TROPONINI in the last 168 hours. CBG: Recent Labs  Lab 03/09/17 0606 03/09/17 1333 03/09/17 1732 03/09/17 2107 03/10/17 0644  GLUCAP 153* 99 216* 174* 154*    Studies/Results: Dg Esophagus  Result Date: 03/08/2017 CLINICAL DATA:  Dysphagia.  Pneumonia EXAM: ESOPHOGRAM / BARIUM SWALLOW / BARIUM TABLET STUDY TECHNIQUE: Combined double contrast and single contrast examination performed using effervescent crystals, thick barium liquid,  and thin barium liquid. The patient was observed with fluoroscopy swallowing a 13 mm barium sulphate tablet. FLUOROSCOPY TIME:  Fluoroscopy Time:  2 minutes 0 second Radiation Exposure Index (if provided by the fluoroscopic device): Number of Acquired Spot Images: 0 COMPARISON:  Chest CT 03/01/2017 FINDINGS: Poor esophageal motility with numerous tertiary contractions noted. Negative for stricture or mass. Negative for hiatal hernia. Small amount of silent aspiration on 1 attempt of swallowing. Barium tablet initially became lodged in the mid esophagus and then passed with additional swallows of water. No stricture is seen in this area. IMPRESSION: Poor esophageal motility without stricture or mass Mild silent aspiration. Electronically Signed   By: Marlan Palau M.D.   On: 03/08/2017 14:11   Dg Swallowing Func-speech Pathology  Result Date: 03/09/2017 Objective Swallowing Evaluation: Type of Study:  MBS-Modified Barium Swallow Study  Patient Details Name: Russell [name] MRN: [id number] Date of Birth: 1970/10/29 Today's Date: 03/09/2017 Time: SLP Start Time (ACUTE ONLY): 1345 -SLP Stop Time (ACUTE ONLY): 1405 SLP Time Calculation (min) (ACUTE ONLY): 20 min Past Medical History: Past Medical History: Diagnosis Date . Anxiety  . Asthma  . CIDP (chronic inflammatory demyelinating polyneuropathy) (HCC) 01/10/2017 . Coronary artery disease involving native coronary artery of native heart with unstable angina pectoris (HCC)   80% LAD-95% oD1 bifurcation lesion & 90% RI --> referred for CABG . Daily headache  . Depression  . DVT (deep venous thrombosis), H/o 01/2014-on Xarelto 03/24/2014  LLE . ESRD (end stage renal disease) on dialysis [facility])   "[facility], MWF" (06/29/2016) . Gait abnormality 12/22/2016 . Gastroparesis 12/22/2016 . Hypertension  . Hypothyroidism  . Nephrotic syndrome 05/18/2014 . Neuropathy  . Type 2 diabetes mellitus with diabetic nephropathy [facility])  Past Surgical History: Past Surgical History: Procedure Laterality Date . ANKLE FRACTURE SURGERY Right 1988 . AV FISTULA PLACEMENT Left 01/01/2015  Procedure: CREATION OF LEFT RADIAL CEPHALIC ARTERIOVENOUS (AV) FISTULA ;  Surgeon: Pryor Ochoa, MD;  Location: [facility] OR;  Service: Vascular;  Laterality: Left; . CAPD REMOVAL N/A 05/19/2016  Procedure: PD CATH REMOVAL;  Surgeon: Abigail Miyamoto, MD;  Location: [facility] OR;  Service: General;  Laterality: N/A; . CARDIAC CATHETERIZATION N/A 09/22/2015  Procedure: Left Heart Cath and Coronary Angiography;  Surgeon: Iran Ouch, MD;  Location: [facility] INVASIVE CV LAB;  Service: Cardiovascular;  Laterality: N/A; . CORONARY ARTERY BYPASS GRAFT N/A 09/28/2015  Procedure: CORONARY ARTERY BYPASS GRAFTING (CABG) x 3 UTILIZING LEFT MAMMARY ARTERY AND ENDOSCOPICALLY HARVESTED LEFT GREATER SAPHENOUS VEIN.;  Surgeon: Delight Ovens, MD;  Location: [facility] OR;  Service: Open Heart Surgery;  Laterality: N/A; . ENDOVEIN HARVEST OF GREATER  SAPHENOUS VEIN Left 09/28/2015  Procedure: ENDOVEIN HARVEST OF GREATER SAPHENOUS VEIN;  Surgeon: Delight Ovens, MD;  Location: [facility] OR;  Service: Open Heart Surgery;  Laterality: Left; . ESOPHAGOGASTRODUODENOSCOPY N/A 03/29/2014  Procedure: ESOPHAGOGASTRODUODENOSCOPY (EGD);  Surgeon: Dorena Cookey, MD;  Location: [facility];  Service: Endoscopy;  Laterality: N/A; . EYE SURGERY   . FRACTURE SURGERY   . INSERTION OF DIALYSIS CATHETER Right 01/05/2015  Procedure: INSERTION OF RIGHT INTERNAL JUGULAR DIALYSIS CATHETER;  Surgeon: Fransisco Hertz, MD;  Location: [facility] OR;  Service: Vascular;  Laterality: Right; . LEFT HEART CATH AND CORS/GRAFTS ANGIOGRAPHY N/A 09/13/2016  Procedure: LEFT HEART CATH AND CORS/GRAFTS ANGIOGRAPHY;  Surgeon: Kathleene Hazel, MD;  Location: [facility] INVASIVE CV LAB;  Service: Cardiovascular;  Laterality: N/A; . RETINAL LASER PROCEDURE Bilateral  . RIGHT/LEFT HEART CATH AND CORONARY/GRAFT ANGIOGRAPHY N/A 03/06/2017  Procedure: RIGHT/LEFT HEART  CATH AND CORONARY/GRAFT ANGIOGRAPHY;  Surgeon: Marykay Lex, MD;  Location: [facility] INVASIVE CV LAB;  Service: Cardiovascular;  Laterality: N/A; . TEE WITHOUT CARDIOVERSION N/A 09/28/2015  Procedure: TRANSESOPHAGEAL ECHOCARDIOGRAM (TEE);  Surgeon: Delight Ovens, MD;  Location: [facility] OR;  Service: Open Heart Surgery;  Laterality: N/A; . TONSILLECTOMY AND ADENOIDECTOMY  1970s HPI: HUSAYN REIM is a 47 y.o. male With pmhx significant for anxiety, asthma, CAD, DVT, depression, gastroparesis, severe erosive esophagitis, DM II who presented with SOB. He was recently diagnosed with CIDP. CTA 03/01/17 showed pna. Per MD notes, pt reports coughing while eating. EGD 03/29/14 findings of severe exudative circumferential esophagitis, appearance consistent with severe reflux.  Subjective: Pt alert and agreeable to testing Assessment / Plan / Recommendation CHL IP CLINICAL IMPRESSIONS 03/09/2017 Clinical Impression Pt presents with one occurrence of slight penetration with initial,  larger sip of thin liquid. Partial amount of penetrates cleared from laryngeal spontaneously, with remainder cleared following cued throat clear from SLP. No other observed airway compromise observed despite challenging with consecutive sips with straw and using mixed consistencies (pill and thin liquid). Pt had mild amount of vallecular residue with solid trials that he cleared spontaneously with subsequent swallows. Oral phase was significant only for min premature spillage with thin liquids. Esophagus scan during pill trial revealed suspected reduced pill propulsion through esophagus consistent with results from esophagram yesterday. Recommend continuing regular solids and thin liquid diet with aspiration and esophageal precautions. Recommend possible follow up with GI. SLP services no longer indicated, please re-consult in the event of acute changes.  SLP Visit Diagnosis Dysphagia, unspecified (R13.10) Attention and concentration deficit following -- Frontal lobe and executive function deficit following -- Impact on safety and function Mild aspiration risk   CHL IP TREATMENT RECOMMENDATION 03/09/2017 Treatment Recommendations No treatment recommended at this time   Prognosis 03/09/2017 Prognosis for Safe Diet Advancement Good Barriers to Reach Goals -- Barriers/Prognosis Comment -- CHL IP DIET RECOMMENDATION 03/09/2017 SLP Diet Recommendations Regular solids;Thin liquid Liquid Administration via Straw;Cup Medication Administration Whole meds with liquid Compensations Slow rate;Small sips/bites;Follow solids with liquid Postural Changes Seated upright at 90 degrees;Remain semi-upright after after feeds/meals (Comment)   CHL IP OTHER RECOMMENDATIONS 03/09/2017 Recommended Consults Consider GI evaluation Oral Care Recommendations Oral care BID Other Recommendations --   CHL IP FOLLOW UP RECOMMENDATIONS 03/09/2017 Follow up Recommendations None   CHL IP FREQUENCY AND DURATION 03/03/2017 Speech Therapy Frequency (ACUTE ONLY) min  1 x/week Treatment Duration 1 week      CHL IP ORAL PHASE 03/09/2017 Oral Phase Impaired Oral - Pudding Teaspoon -- Oral - Pudding Cup -- Oral - Honey Teaspoon -- Oral - Honey Cup -- Oral - Nectar Teaspoon -- Oral - Nectar Cup -- Oral - Nectar Straw -- Oral - Thin Teaspoon -- Oral - Thin Cup Premature spillage Oral - Thin Straw Premature spillage Oral - Puree WFL Oral - Mech Soft -- Oral - Regular WFL Oral - Multi-Consistency -- Oral - Pill Reduced posterior propulsion Oral Phase - Comment --  CHL IP PHARYNGEAL PHASE 03/09/2017 Pharyngeal Phase Impaired Pharyngeal- Pudding Teaspoon -- Pharyngeal -- Pharyngeal- Pudding Cup -- Pharyngeal -- Pharyngeal- Honey Teaspoon -- Pharyngeal -- Pharyngeal- Honey Cup -- Pharyngeal -- Pharyngeal- Nectar Teaspoon -- Pharyngeal -- Pharyngeal- Nectar Cup -- Pharyngeal -- Pharyngeal- Nectar Straw -- Pharyngeal -- Pharyngeal- Thin Teaspoon -- Pharyngeal -- Pharyngeal- Thin Cup Penetration/Aspiration during swallow;Other (Comment) Pharyngeal Material enters airway, remains ABOVE vocal cords and not ejected out Pharyngeal- Thin Straw WFL Pharyngeal --  Pharyngeal- Puree Pharyngeal residue - valleculae;Reduced tongue base retraction Pharyngeal -- Pharyngeal- Mechanical Soft -- Pharyngeal -- Pharyngeal- Regular Pharyngeal residue - valleculae;Reduced tongue base retraction Pharyngeal -- Pharyngeal- Multi-consistency -- Pharyngeal -- Pharyngeal- Pill WFL Pharyngeal -- Pharyngeal Comment --  CHL IP CERVICAL ESOPHAGEAL PHASE 03/09/2017 Cervical Esophageal Phase WFL Pudding Teaspoon -- Pudding Cup -- Honey Teaspoon -- Honey Cup -- Nectar Teaspoon -- Nectar Cup -- Nectar Straw -- Thin Teaspoon -- Thin Cup -- Thin Straw -- Puree -- Mechanical Soft -- Regular -- Multi-consistency -- Pill -- Cervical Esophageal Comment -- No flowsheet data found. Maxcine Hamaiewonsky, Laura 03/09/2017, 4:35 PM  Maxcine HamLaura Paiewonsky, M.A. CCC-SLP [phone]             Medications: . sodium chloride    . ferric gluconate  (FERRLECIT/NULECIT) IV     . aspirin EC  81 mg Oral Daily  . atorvastatin  80 mg Oral q1800  . calcitRIOL  1.25 mcg Oral Q M,W,F-HD  . calcium acetate  2,001 mg Oral TID WC  . cinacalcet  30 mg Oral Q M,W,F  . citalopram  40 mg Oral QHS  . dextromethorphan-guaiFENesin  1 tablet Oral BID  . fluticasone  1 spray Each Nare Daily  . gabapentin  200 mg Oral TID  . heparin  5,000 Units Subcutaneous Q8H  . insulin aspart  0-5 Units Subcutaneous QHS  . insulin aspart  0-9 Units Subcutaneous TID WC  . insulin glargine  20 Units Subcutaneous QHS  . levothyroxine  50 mcg Oral QAC breakfast  . pantoprazole  40 mg Oral BID  . sodium chloride flush  3 mL Intravenous Q12H

## 2017-03-10 NOTE — Progress Notes (Signed)
Late entry 1430 Patient was sleeping, he bent his left access arm causing venous infiltration, blood returned thru arterial, ice applied and Dr Arlean HoppingSchertz notified

## 2017-03-10 NOTE — Progress Notes (Signed)
PROGRESS NOTE  Russell [name] HKV:[id number] DOB: January 08, 1970 DOA: 03/01/2017 PCP: [name], MD  HPI/Recap of past 24 hours: Russell [name] a 47 y.o.male with pmhx significant for anxiety, asthma, CAD, DVT, depression, gastroparesis, DM II, CIDP,  ESRD who presents with dyspnea & orthopnea X 2 weeks. CTA showed CAP. ECHO showed EF of 30% reduced from previous. Cardiology was consulted and recommended cath. Cardiac cath showed stable CA, suspect reduce EF is related to volume-macrovascular ischemia. Pt admitted for further management.  Today, pt reports feeling better, SOB is improving, denies any chest pain, abdominal pain, N/V, fever/chills.   Assessment/Plan: Active Problems:   Hypothyroidism (acquired)   Diabetic neuropathy (HCC)   Uncontrolled diabetes mellitus type 2 with peripheral artery disease (HCC)   Morbid obesity (HCC)   Essential hypertension, benign   ESRD on dialysis (HCC)   History of DVT of lower extremity   Quadriparesis (HCC)   CIDP (chronic inflammatory demyelinating polyneuropathy) (HCC)   CAP (community acquired pneumonia)  CAP with lung mass on CT Afebrile with no leukocytosis CT chest with PNA Completed 7 days of doxycycline and cefepime, continue nebs Of note patient was evaluated by Dr Isaiah Serge, pulmonologist 2-28 for lung mass, follow up, will need PET scan in 4-5 weeks  Speech evaluation, recommend regular diet, with thin liquid DG esophagram done 3/7 showed: poor esophageal motility without stricture or mass with Mild silent aspiration. 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 Cardiology on board: As pt is ESRD, pt will need more aggressive HD, strict fluid restriction  CAD/ hx of CABG/chest pain CT angio negative for PE Troponin times 2 negative.  Cath 03/06/17: Stable CAD, severely diseased LAD, D1 and RI, widely patent grafts, normal  RCA Continue with aspirin, Lipitor, metoprolol reduced  ESRD on HD Nephrology on board  Fluid management by HD  HTN Continue with Hydralazine prn, metoprolol reduced to 25mg  BID  DM type 2 Continue with lantus and SSI.   HLD Statins   Depression/Anxiety Cont celexa No SI/HI Prn klonopin & atarax  Hypothyroidism Continue with synthroid   CIDP Follows with Dr Anne Hahn. He gets IVIG prn  PT/OT  Gastroparesis Stable Reglan TID PRN  Chronic pain Change dilaudid to PRN.   Prolong QT Change azithro to doxy  Improved to 480     Code Status: Full  Family Communication: None at bedside  Disposition Plan: Once SOB improves with HD, likely 03/12/17    Consultants:  Cardiology  Nephrology  Procedures:  Cardiac cath  Antimicrobials:  S/P IV Cefepime  Doxycycline   DVT prophylaxis:   heparin   Objective: Vitals:   03/10/17 1400 03/10/17 1430 03/10/17 1440 03/10/17 1549  BP: (!) 116/52 125/69 (!) 94/48 135/67  Pulse: 75 78 97   Resp: 17 18 17    Temp:   (!) 96.9 F (36.1 C) 98.1 F (36.7 C)  TempSrc:   Oral Oral  SpO2:      Weight:      Height:        Intake/Output Summary (Last 24 hours) at 03/10/2017 1713 Last data filed at 03/10/2017 1430 Gross per 24 hour  Intake 120 ml  Output 1001 ml  Net -881 ml   Filed Weights   03/10/17 0332 03/10/17 1000 03/10/17 1245  Weight: 115.7 kg (255 lb 1.2 oz) 113.3 kg (249 lb 12.5 oz) 114  kg (251 lb 5.2 oz)    Exam:   General:  NAD, obese  Cardiovascular: RRR, S1, S2   Respiratory: CTA   Abdomen: Soft, obese, NT, ND, BS+  Musculoskeletal: No pedal edema b/l  Skin: Normal  Psychiatry: Normal affect   Data Reviewed: CBC: Recent Labs  Lab 03/06/17 1812 03/07/17 0310 03/08/17 0341 03/09/17 0307 03/10/17 1310  WBC 7.6 8.8 8.1 6.7 7.0  NEUTROABS  --   --  5.6 4.5 4.9  HGB 10.0* 9.3* 10.0* 10.3* 11.9*  HCT 32.3* 29.6* 32.5* 32.4* 36.6*  MCV 93.1 92.8 92.6 93.1 92.7  PLT 272  251 282 279 304   Basic Metabolic Panel: Recent Labs  Lab 03/05/17 0528 03/06/17 1812 03/07/17 1109 03/08/17 0341 03/09/17 0307 03/10/17 0337  NA 130*  --  131* 132* 132* 134*  K 4.5  --  4.9 3.9 4.7 4.8  CL 93*  --  97* 93* 91* 90*  CO2 21*  --  20* 29 28 29   GLUCOSE 249*  --  171* 129* 157* 165*  BUN 73*  --  71* 33* 52* 33*  CREATININE 10.23* 8.36* 9.11* 5.77* 7.89* 6.04*  CALCIUM 9.1  --  8.5* 8.3* 9.1 9.7  PHOS  --   --  6.8*  --   --   --    GFR: Estimated Creatinine Clearance: 20.5 mL/min (A) (by C-G formula based on SCr of 6.04 mg/dL (H)). Liver Function Tests: Recent Labs  Lab 03/07/17 1109  ALBUMIN 3.1*   No results for input(s): LIPASE, AMYLASE in the last 168 hours. No results for input(s): AMMONIA in the last 168 hours. Coagulation Profile: Recent Labs  Lab 03/06/17 0459  INR 1.18   Cardiac Enzymes: No results for input(s): CKTOTAL, CKMB, CKMBINDEX, TROPONINI in the last 168 hours. BNP (last 3 results) No results for input(s): PROBNP in the last 8760 hours. HbA1C: No results for input(s): HGBA1C in the last 72 hours. CBG: Recent Labs  Lab 03/09/17 1732 03/09/17 2107 03/10/17 0644 03/10/17 1106 03/10/17 1634  GLUCAP 216* 174* 154* 224* 147*   Lipid Profile: No results for input(s): CHOL, HDL, LDLCALC, TRIG, CHOLHDL, LDLDIRECT in the last 72 hours. Thyroid Function Tests: No results for input(s): TSH, T4TOTAL, FREET4, T3FREE, THYROIDAB in the last 72 hours. Anemia Panel: No results for input(s): VITAMINB12, FOLATE, FERRITIN, TIBC, IRON, RETICCTPCT in the last 72 hours. Urine analysis:    Component Value Date/Time   COLORURINE YELLOW 05/18/2016 2110   APPEARANCEUR HAZY (A) 05/18/2016 2110   LABSPEC 1.018 05/18/2016 2110   PHURINE 7.0 05/18/2016 2110   GLUCOSEU >=500 (A) 05/18/2016 2110   HGBUR NEGATIVE 05/18/2016 2110   BILIRUBINUR NEGATIVE 05/18/2016 2110   BILIRUBINUR neg 05/26/2014 1956   KETONESUR NEGATIVE 05/18/2016 2110    PROTEINUR >=300 (A) 05/18/2016 2110   UROBILINOGEN 0.2 09/30/2014 1730   NITRITE NEGATIVE 05/18/2016 2110   LEUKOCYTESUR NEGATIVE 05/18/2016 2110   Sepsis Labs: @LABRCNTIP (procalcitonin:4,lacticidven:4)  ) Recent Results (from the past 240 hour(s))  MRSA PCR Screening     Status: None   Collection Time: 03/01/17 11:30 PM  Result Value Ref Range Status   MRSA by PCR NEGATIVE NEGATIVE Final    Comment:        The GeneXpert MRSA Assay (FDA approved for NASAL specimens only), is one component of a comprehensive MRSA colonization surveillance program. It is not intended to diagnose MRSA infection nor to guide or monitor treatment for MRSA infections. Performed at [facility], [address] [place], [place] [address]       Studies: No results found.  Scheduled Meds: . aspirin EC  81 mg Oral Daily  . atorvastatin  80 mg Oral q1800  . calcitRIOL  1.25 mcg Oral Q M,W,F-HD  . calcium acetate  2,001 mg Oral TID WC  . cinacalcet  30 mg Oral Q M,W,F  . citalopram  40 mg Oral QHS  . dextromethorphan-guaiFENesin  1 tablet Oral BID  . fluticasone  1 spray Each Nare Daily  . gabapentin  200 mg Oral TID  . heparin  5,000 Units Subcutaneous Q8H  . heparin  7,000 Units Dialysis Once in dialysis  . insulin aspart  0-5 Units Subcutaneous QHS  . insulin aspart  0-9 Units Subcutaneous TID WC  . insulin glargine  20 Units Subcutaneous QHS  . levothyroxine  50 mcg Oral QAC breakfast  . metoprolol succinate  25 mg Oral BID  . pantoprazole  40 mg Oral BID  . sodium chloride flush  3 mL Intravenous Q12H    Continuous Infusions: . sodium chloride    . ferric gluconate (FERRLECIT/NULECIT) IV       LOS: 8 days     Russell Cedar, MD [facility]   If 7PM-7AM, please contact night-coverage [email or web address] Password [email or web address] 03/10/2017, 5:13 PM

## 2017-03-11 LAB — GLUCOSE, CAPILLARY
GLUCOSE-CAPILLARY: 251 mg/dL — AB (ref 65–99)
Glucose-Capillary: 158 mg/dL — ABNORMAL HIGH (ref 65–99)
Glucose-Capillary: 206 mg/dL — ABNORMAL HIGH (ref 65–99)
Glucose-Capillary: 215 mg/dL — ABNORMAL HIGH (ref 65–99)

## 2017-03-11 LAB — BASIC METABOLIC PANEL
Anion gap: 12 (ref 5–15)
BUN: 41 mg/dL — ABNORMAL HIGH (ref 6–20)
CO2: 27 mmol/L (ref 22–32)
CREATININE: 7.14 mg/dL — AB (ref 0.61–1.24)
Calcium: 9.5 mg/dL (ref 8.9–10.3)
Chloride: 94 mmol/L — ABNORMAL LOW (ref 101–111)
GFR calc non Af Amer: 8 mL/min — ABNORMAL LOW (ref >60)
GFR, EST AFRICAN AMERICAN: 10 mL/min — AB (ref >60)
GLUCOSE: 190 mg/dL — AB (ref 65–99)
Potassium: 4.4 mmol/L (ref 3.5–5.1)
Sodium: 133 mmol/L — ABNORMAL LOW (ref 135–145)

## 2017-03-11 NOTE — Progress Notes (Signed)
PROGRESS NOTE  EASTON FETTY ZOX:[id number] DOB: 1970-11-29 DOA: 03/01/2017 PCP: [name], MD  HPI/Recap of past 24 hours: Russell Sheffield Silveris a 47 y.o.male with pmhx significant for anxiety, asthma, CAD, DVT, depression, gastroparesis, DM II, CIDP,  ESRD who presents with dyspnea & orthopnea X 2 weeks. CTA showed CAP. ECHO showed EF of 30% reduced from previous. Cardiology was consulted and recommended cath. Cardiac cath showed stable CA, suspect reduce EF is related to volume-macrovascular ischemia. Pt admitted for further management.  Today, pt reports feeling better, has some pain in his L arm, ice applied. SOB is improving, denies any chest pain, abdominal pain, N/V, fever/chills. Advised to follow a strict fluid intake   Assessment/Plan: Active Problems:   Hypothyroidism (acquired)   Diabetic neuropathy (HCC)   Uncontrolled diabetes mellitus type 2 with peripheral artery disease (HCC)   Morbid obesity (HCC)   Essential hypertension, benign   ESRD on dialysis (HCC)   History of DVT of lower extremity   Quadriparesis (HCC)   CIDP (chronic inflammatory demyelinating polyneuropathy) (HCC)   CAP (community acquired pneumonia)  CAP with lung mass on CT Afebrile with no leukocytosis CT chest with PNA Completed 7 days of doxycycline and cefepime, continue nebs Of note patient was evaluated by Dr Isaiah Serge, pulmonologist 2-28 for lung mass, follow up, will need PET scan in 4-5 weeks  Speech evaluation, recommend regular diet, with thin liquid DG esophagram done 3/7 showed: poor esophageal motility without stricture or mass with Mild silent aspiration. 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 Cardiology on board: As pt is ESRD, pt will need more aggressive HD, strict fluid restriction  CAD/ hx of CABG/chest pain CT angio negative for PE Troponin times 2 negative.  Cath  03/06/17: Stable CAD, severely diseased LAD, D1 and RI, widely patent grafts, normal RCA Continue with aspirin, Lipitor, metoprolol reduced  ESRD on HD Nephrology on board  Fluid management by HD  HTN Continue with Hydralazine prn, metoprolol reduced to 25mg  BID  DM type 2 Continue with lantus and SSI.   HLD Statins   Depression/Anxiety Cont celexa No SI/HI Prn klonopin & atarax  Hypothyroidism Continue with synthroid   CIDP Follows with Dr Anne Hahn. He gets IVIG prn  PT/OT  Gastroparesis Stable Reglan TID PRN  Chronic pain Change dilaudid to PRN.   Prolong QT Change azithro to doxy  Improved to 480     Code Status: Full  Family Communication: None at bedside  Disposition Plan: Once SOB improves with HD, likely d/c on 03/12/17 after HD   Consultants:  Cardiology  Nephrology  Procedures:  Cardiac cath  Antimicrobials:  S/P IV Cefepime  Doxycycline   DVT prophylaxis:  [place] heparin   Objective: Vitals:   03/10/17 1549 03/10/17 2046 03/11/17 0524 03/11/17 0806  BP: 135/67 126/73 134/74 138/83  Pulse:  70 70   Resp:      Temp: 98.1 F (36.7 C) 98 F (36.7 C) 97.6 F (36.4 C)   TempSrc: Oral Oral Oral   SpO2:  96% 96%   Weight:   113.4 kg (250 lb)   Height:        Intake/Output Summary (Last 24 hours) at 03/11/2017 1247 Last data filed at 03/11/2017 0954 Gross per 24 hour  Intake 480 ml  Output 1000 ml  Net -520 ml   [employer]  03/10/17 1245 03/10/17 1500 03/11/17 0524  Weight: 114 kg (251 lb 5.2 oz) 113 kg (249 lb 1.9 oz) 113.4 kg (250 lb)    Exam:   General:  NAD, obese  Cardiovascular: RRR, S1, S2   Respiratory: CTA   Abdomen: Soft, obese, NT, ND, BS+  Musculoskeletal: No pedal edema b/l  Skin: Normal  Psychiatry: Normal affect   Data Reviewed: CBC: Recent Labs  Lab 03/06/17 1812 03/07/17 0310 03/08/17 0341 03/09/17 0307 03/10/17 1310  WBC 7.6 8.8 8.1 6.7 7.0  NEUTROABS  --   --  5.6 4.5 4.9   HGB 10.0* 9.3* 10.0* 10.3* 11.9*  HCT 32.3* 29.6* 32.5* 32.4* 36.6*  MCV 93.1 92.8 92.6 93.1 92.7  PLT 272 251 282 279 304   Basic Metabolic Panel: Recent Labs  Lab 03/07/17 1109 03/08/17 0341 03/09/17 0307 03/10/17 0337 03/11/17 0450  NA 131* 132* 132* 134* 133*  K 4.9 3.9 4.7 4.8 4.4  CL 97* 93* 91* 90* 94*  CO2 20* 29 28 29 27   GLUCOSE 171* 129* 157* 165* 190*  BUN 71* 33* 52* 33* 41*  CREATININE 9.11* 5.77* 7.89* 6.04* 7.14*  CALCIUM 8.5* 8.3* 9.1 9.7 9.5  PHOS 6.8*  --   --   --   --    GFR: Estimated Creatinine Clearance: 17.3 mL/min (A) (by C-G formula based on SCr of 7.14 mg/dL (H)). Liver Function Tests: Recent Labs  Lab 03/07/17 1109  ALBUMIN 3.1*   No results for input(s): LIPASE, AMYLASE in the last 168 hours. No results for input(s): AMMONIA in the last 168 hours. Coagulation Profile: Recent Labs  Lab 03/06/17 0459  INR 1.18   Cardiac Enzymes: No results for input(s): CKTOTAL, CKMB, CKMBINDEX, TROPONINI in the last 168 hours. BNP (last 3 results) No results for input(s): PROBNP in the last 8760 hours. HbA1C: No results for input(s): HGBA1C in the last 72 hours. CBG: Recent Labs  Lab 03/10/17 1106 03/10/17 1634 03/10/17 2051 03/11/17 0748 03/11/17 1132  GLUCAP 224* 147* 225* 158* 206*   Lipid Profile: No results for input(s): CHOL, HDL, LDLCALC, TRIG, CHOLHDL, LDLDIRECT in the last 72 hours. Thyroid Function Tests: No results for input(s): TSH, T4TOTAL, FREET4, T3FREE, THYROIDAB in the last 72 hours. Anemia Panel: No results for input(s): VITAMINB12, FOLATE, FERRITIN, TIBC, IRON, RETICCTPCT in the last 72 hours. Urine analysis:    Component Value Date/Time   COLORURINE YELLOW 05/18/2016 2110   APPEARANCEUR HAZY (A) 05/18/2016 2110   LABSPEC 1.018 05/18/2016 2110   PHURINE 7.0 05/18/2016 2110   GLUCOSEU >=500 (A) 05/18/2016 2110   HGBUR NEGATIVE 05/18/2016 2110   BILIRUBINUR NEGATIVE 05/18/2016 2110   BILIRUBINUR neg 05/26/2014 1956     KETONESUR NEGATIVE 05/18/2016 2110   PROTEINUR >=300 (A) 05/18/2016 2110   UROBILINOGEN 0.2 09/30/2014 1730   NITRITE NEGATIVE 05/18/2016 2110   LEUKOCYTESUR NEGATIVE 05/18/2016 2110   Sepsis Labs: @LABRCNTIP (procalcitonin:4,lacticidven:4)  ) Recent Results (from the past 240 hour(s))  MRSA PCR Screening     Status: None   Collection Time: 03/01/17 11:30 PM  Result Value Ref Range Status   MRSA by PCR NEGATIVE NEGATIVE Final    Comment:        The GeneXpert MRSA Assay (FDA approved for NASAL specimens only), is one component of a comprehensive MRSA colonization surveillance program. It is not intended to diagnose MRSA infection nor to guide or monitor treatment for MRSA infections. Performed at [facility], [address] [place], [place] [address]  Studies: No results found.  Scheduled Meds: . aspirin EC  81 mg Oral Daily  . atorvastatin  80 mg Oral q1800  . calcitRIOL  1.25 mcg Oral Q M,W,F-HD  . calcium acetate  2,001 mg Oral TID WC  . cinacalcet  30 mg Oral Q M,W,F  . citalopram  40 mg Oral QHS  . dextromethorphan-guaiFENesin  1 tablet Oral BID  . fluticasone  1 spray Each Nare Daily  . gabapentin  200 mg Oral TID  . heparin  5,000 Units Subcutaneous Q8H  . heparin  7,000 Units Dialysis Once in dialysis  . insulin aspart  0-5 Units Subcutaneous QHS  . insulin aspart  0-9 Units Subcutaneous TID WC  . insulin glargine  20 Units Subcutaneous QHS  . levothyroxine  50 mcg Oral QAC breakfast  . metoprolol succinate  25 mg Oral BID  . pantoprazole  40 mg Oral BID  . sodium chloride flush  3 mL Intravenous Q12H    Continuous Infusions: . sodium chloride    . ferric gluconate (FERRLECIT/NULECIT) IV       LOS: 9 days     Briant Cedar, MD [facility]   If 7PM-7AM, please contact night-coverage [email or web address] Password [facility] 03/11/2017, 12:47 PM

## 2017-03-11 NOTE — Progress Notes (Signed)
Subjective:  Stable, doing better w/ po fluids, 113.4 kg this am  Objective Vital signs in last 24 hours: Vitals:   03/10/17 1549 03/10/17 2046 03/11/17 0524 03/11/17 0806  BP: 135/67 126/73 134/74 138/83  Pulse:  70 70   Resp:      Temp: 98.1 F (36.7 C) 98 F (36.7 C) 97.6 F (36.4 C)   TempSrc: Oral Oral Oral   SpO2:  96% 96%   Weight:   113.4 kg (250 lb)   Height:       Weight change: -4.1 kg (-0.6 oz)   Physical Exam: General:  on hd, alert, obese WM NAD  Heart: RRR,  1/6sem , No  r, g Lungs: CTA , unlabored breathing  Abdomen: obes, soft , Nt, ND Extremities: no pedal edema  Dialysis Access: pos bruit LUA AVF    Dialysis: [facility] MWF 4h 112kg 2/2.25 bath Hep 8000 +2000 midrun LFA AVF -Calcitriol 1.38mcg po/HD -Mircera 50 q 2wks (last on 02/28/17) -Venofer 50mg  q wkly   Assessment: 1. Dyspnea - mostly vol overload, shown by Lindi Adie at heart cath; other w/u negative. See cath/ cards notes.  No HD today. HD Monday am, get vol down further. May be OK for dc after HD tomorrow.  2. ESRD - HD MWF.  Couldn't get much off yesterday due to BB related hypotension. Have reduced MTP to 25 bid, may need dc altogether if BP's don't stay up.  3. CM EF 25-30% =lower than 4/5 months ago. Heart cath showed patient grafts/ stents but high filling pressures.  4. HTN - metop/ hydral at home, on MTP XL bid here, decreased yest from 100 bid to 25 bid, hold sbp < 120- 130.  5. Volume - as above 6. Anemia -hgb 10.0  No esa ( last given 02/28/17 )  Weekly venofer on hd  Follow up hgb  trend  7. Secondary hyperparathyroidism - phos 6.2 / ca 9.4 on phoslo and sensipar, vdra 8. Dm type 2 - per admit  9. Depression /anxiety - Citalopram  10. Gatroparesis = TID Reglan   PLan - as above   [name] MD [employer] pgr [phone]   03/11/2017, 11:08 AM        Labs: Basic Metabolic Panel: Recent Labs  Lab 03/07/17 1109  03/09/17 0307  03/10/17 0337 03/11/17 0450  NA 131*   < > 132* 134* 133*  K 4.9   < > 4.7 4.8 4.4  CL 97*   < > 91* 90* 94*  CO2 20*   < > 28 29 27   GLUCOSE 171*   < > 157* 165* 190*  BUN 71*   < > 52* 33* 41*  CREATININE 9.11*   < > 7.89* 6.04* 7.14*  CALCIUM 8.5*   < > 9.1 9.7 9.5  PHOS 6.8*  --   --   --   --    < > = values in this interval not displayed.   Liver Function Tests: Recent Labs  Lab 03/07/17 1109  ALBUMIN 3.1*   No results for input(s): LIPASE, AMYLASE in the last 168 hours. No results for input(s): AMMONIA in the last 168 hours. CBC: Recent Labs  Lab 03/06/17 1812 03/07/17 0310 03/08/17 0341 03/09/17 0307 03/10/17 1310  WBC 7.6 8.8 8.1 6.7 7.0  NEUTROABS  --   --  5.6 4.5 4.9  HGB 10.0* 9.3* 10.0* 10.3* 11.9*  HCT 32.3* 29.6* 32.5* 32.4* 36.6*  MCV 93.1 92.8 92.6 93.1  92.7  PLT 272 251 282 279 304   Cardiac Enzymes: No results for input(s): CKTOTAL, CKMB, CKMBINDEX, TROPONINI in the last 168 hours. CBG: Recent Labs  Lab 03/10/17 0644 03/10/17 1106 03/10/17 1634 03/10/17 2051 03/11/17 0748  GLUCAP 154* 224* 147* 225* 158*    Studies/Results: Dg Swallowing Func-speech Pathology  Result Date: 03/09/2017 Objective Swallowing Evaluation: Type of Study: MBS-Modified Barium Swallow Study  Patient Details Name: Fonnie BirkenheadSteven B Lehrman MRN: [id number] Date of Birth: 1970/09/04 Today's Date: 03/09/2017 Time: SLP Start Time (ACUTE ONLY): 1345 -SLP Stop Time (ACUTE ONLY): 1405 SLP Time Calculation (min) (ACUTE ONLY): 20 min Past Medical History: Past Medical History: Diagnosis Date . Anxiety  . Asthma  . CIDP (chronic inflammatory demyelinating polyneuropathy) (HCC) 01/10/2017 . Coronary artery disease involving native coronary artery of native heart with unstable angina pectoris (HCC)   80% LAD-95% oD1 bifurcation lesion & 90% RI --> referred for CABG . Daily headache  . Depression  . DVT (deep venous thrombosis), H/o 01/2014-on Xarelto 03/24/2014  LLE . ESRD (end stage renal disease) on  dialysis [facility](HCC)   "[place], MWF" (06/29/2016) . Gait abnormality 12/22/2016 . Gastroparesis 12/22/2016 . Hypertension  . Hypothyroidism  . Nephrotic syndrome 05/18/2014 . Neuropathy  . Type 2 diabetes mellitus with diabetic nephropathy [facility](HCC)  Past Surgical History: Past Surgical History: Procedure Laterality Date . ANKLE FRACTURE SURGERY Right 1988 . AV FISTULA PLACEMENT Left 01/01/2015  Procedure: CREATION OF LEFT RADIAL CEPHALIC ARTERIOVENOUS (AV) FISTULA ;  Surgeon: Pryor OchoaJames D Lawson, MD;  [facility];  Service: Vascular;  Laterality: Left; . CAPD REMOVAL N/A 05/19/2016  Procedure: PD CATH REMOVAL;  Surgeon: Abigail MiyamotoBlackman, Douglas, MD;  [facility];  Service: General;  Laterality: N/A; . CARDIAC CATHETERIZATION N/A 09/22/2015  Procedure: Left Heart Cath and Coronary Angiography;  Surgeon: Iran OuchMuhammad A Arida, MD;  Location: [facility] INVASIVE CV LAB;  Service: Cardiovascular;  Laterality: N/A; . CORONARY ARTERY BYPASS GRAFT N/A 09/28/2015  Procedure: CORONARY ARTERY BYPASS GRAFTING (CABG) x 3 UTILIZING LEFT MAMMARY ARTERY AND ENDOSCOPICALLY HARVESTED LEFT GREATER SAPHENOUS VEIN.;  Surgeon: Delight OvensEdward B Gerhardt, MD;  Location: [facility] OR;  Service: Open Heart Surgery;  Laterality: N/A; . ENDOVEIN HARVEST OF GREATER SAPHENOUS VEIN Left 09/28/2015  Procedure: ENDOVEIN HARVEST OF GREATER SAPHENOUS VEIN;  Surgeon: Delight OvensEdward B Gerhardt, MD;  [facility];  Service: Open Heart Surgery;  Laterality: Left; . ESOPHAGOGASTRODUODENOSCOPY N/A 03/29/2014  Procedure: ESOPHAGOGASTRODUODENOSCOPY (EGD);  Surgeon: Dorena CookeyJohn Hayes, MD;  [facility];  Service: Endoscopy;  Laterality: N/A; . EYE SURGERY   . FRACTURE SURGERY   . INSERTION OF DIALYSIS CATHETER Right 01/05/2015  Procedure: INSERTION OF RIGHT INTERNAL JUGULAR DIALYSIS CATHETER;  Surgeon: Fransisco HertzBrian L Chen, MD;  [facility];  Service: Vascular;  Laterality: Right; . LEFT HEART CATH AND CORS/GRAFTS ANGIOGRAPHY N/A 09/13/2016  Procedure: LEFT HEART CATH AND CORS/GRAFTS ANGIOGRAPHY;  Surgeon:  Kathleene HazelMcAlhany, Christopher D, MD;  Location: [facility] INV[facility] CV LAB;  Service: Cardiovascular;  Laterality: N/A; . RETINAL LASER PROCEDURE Bilateral  . RIGHT/LEFT HEART CATH AND CORONARY/GRAFT ANGIOGRAPHY N/A 03/06/2017  Procedure: RIGHT/LEFT HEART CATH AND CORONARY/GRAFT ANGIOGRAPHY;  Surgeon: Marykay LexHarding, David W, MD;  [facility];  Service: Cardiovascular;  Laterality: N/A; . TEE WITHOUT CARDIOVERSION N/A 09/28/2015  Procedure: TRANSESOPHAGEAL ECHOCARDIOGRAM (TEE);  Surgeon: Delight OvensEdward B Gerhardt, MD;  [facility];  Service: Open Heart Surgery;  Laterality: N/A; . TONSILLECTOMY AND ADENOIDECTOMY  1970s HPI: Fonnie BirkenheadSteven B Albano is a 47 y.o. male With pmhx significant for anxiety, asthma, CAD, DVT, depression,  gastroparesis, severe erosive esophagitis, DM II who presented with SOB. He was recently diagnosed with CIDP. CTA 03/01/17 showed pna. Per MD notes, pt reports coughing while eating. EGD 03/29/14 findings of severe exudative circumferential esophagitis, appearance consistent with severe reflux.  Subjective: Pt alert and agreeable to testing Assessment / Plan / Recommendation CHL IP CLINICAL IMPRESSIONS 03/09/2017 Clinical Impression Pt presents with one occurrence of slight penetration with initial, larger sip of thin liquid. Partial amount of penetrates cleared from laryngeal spontaneously, with remainder cleared following cued throat clear from SLP. No other observed airway compromise observed despite challenging with consecutive sips with straw and using mixed consistencies (pill and thin liquid). Pt had mild amount of vallecular residue with solid trials that he cleared spontaneously with subsequent swallows. Oral phase was significant only for min premature spillage with thin liquids. Esophagus scan during pill trial revealed suspected reduced pill propulsion through esophagus consistent with results from esophagram yesterday. Recommend continuing regular solids and thin liquid diet with aspiration and  esophageal precautions. Recommend possible follow up with GI. SLP services no longer indicated, please re-consult in the event of acute changes.  SLP Visit Diagnosis Dysphagia, unspecified (R13.10) Attention and concentration deficit following -- Frontal lobe and executive function deficit following -- Impact on safety and function Mild aspiration risk   CHL IP TREATMENT RECOMMENDATION 03/09/2017 Treatment Recommendations No treatment recommended at this time   Prognosis 03/09/2017 Prognosis for Safe Diet Advancement Good Barriers to Reach Goals -- Barriers/Prognosis Comment -- CHL IP DIET RECOMMENDATION 03/09/2017 SLP Diet Recommendations Regular solids;Thin liquid Liquid Administration via Straw;Cup Medication Administration Whole meds with liquid Compensations Slow rate;Small sips/bites;Follow solids with liquid Postural Changes Seated upright at 90 degrees;Remain semi-upright after after feeds/meals (Comment)   CHL IP OTHER RECOMMENDATIONS 03/09/2017 Recommended Consults Consider GI evaluation Oral Care Recommendations Oral care BID Other Recommendations --   CHL IP FOLLOW UP RECOMMENDATIONS 03/09/2017 Follow up Recommendations None   CHL IP FREQUENCY AND DURATION 03/03/2017 Speech Therapy Frequency (ACUTE ONLY) min 1 x/week Treatment Duration 1 week      CHL IP ORAL PHASE 03/09/2017 Oral Phase Impaired Oral - Pudding Teaspoon -- Oral - Pudding Cup -- Oral - Honey Teaspoon -- Oral - Honey Cup -- Oral - Nectar Teaspoon -- Oral - Nectar Cup -- Oral - Nectar Straw -- Oral - Thin Teaspoon -- Oral - Thin Cup Premature spillage Oral - Thin Straw Premature spillage Oral - Puree WFL Oral - Mech Soft -- Oral - Regular WFL Oral - Multi-Consistency -- Oral - Pill Reduced posterior propulsion Oral Phase - Comment --  CHL IP PHARYNGEAL PHASE 03/09/2017 Pharyngeal Phase Impaired Pharyngeal- Pudding Teaspoon -- Pharyngeal -- Pharyngeal- Pudding Cup -- Pharyngeal -- Pharyngeal- Honey Teaspoon -- Pharyngeal -- Pharyngeal- Honey Cup --  Pharyngeal -- Pharyngeal- Nectar Teaspoon -- Pharyngeal -- Pharyngeal- Nectar Cup -- Pharyngeal -- Pharyngeal- Nectar Straw -- Pharyngeal -- Pharyngeal- Thin Teaspoon -- Pharyngeal -- Pharyngeal- Thin Cup Penetration/Aspiration during swallow;Other (Comment) Pharyngeal Material enters airway, remains ABOVE vocal cords and not ejected out Pharyngeal- Thin Straw WFL Pharyngeal -- Pharyngeal- Puree Pharyngeal residue - valleculae;Reduced tongue base retraction Pharyngeal -- Pharyngeal- Mechanical Soft -- Pharyngeal -- Pharyngeal- Regular Pharyngeal residue - valleculae;Reduced tongue base retraction Pharyngeal -- Pharyngeal- Multi-consistency -- Pharyngeal -- Pharyngeal- Pill WFL Pharyngeal -- Pharyngeal Comment --  CHL IP CERVICAL ESOPHAGEAL PHASE 03/09/2017 Cervical Esophageal Phase WFL Pudding Teaspoon -- Pudding Cup -- Honey Teaspoon -- Honey Cup -- Nectar Teaspoon -- Nectar Cup -- Nectar Straw -- Thin Teaspoon --  Thin Cup -- Thin Straw -- Puree -- Mechanical Soft -- Regular -- Multi-consistency -- Pill -- Cervical Esophageal Comment -- No flowsheet data found. Maxcine Ham 03/09/2017, 4:35 PM  Maxcine Ham, M.A. CCC-SLP [phone]             Medications: . sodium chloride    . ferric gluconate (FERRLECIT/NULECIT) IV     . aspirin EC  81 mg Oral Daily  . atorvastatin  80 mg Oral q1800  . calcitRIOL  1.25 mcg Oral Q M,W,F-HD  . calcium acetate  2,001 mg Oral TID WC  . cinacalcet  30 mg Oral Q M,W,F  . citalopram  40 mg Oral QHS  . dextromethorphan-guaiFENesin  1 tablet Oral BID  . fluticasone  1 spray Each Nare Daily  . gabapentin  200 mg Oral TID  . heparin  5,000 Units Subcutaneous Q8H  . heparin  7,000 Units Dialysis Once in dialysis  . insulin aspart  0-5 Units Subcutaneous QHS  . insulin aspart  0-9 Units Subcutaneous TID WC  . insulin glargine  20 Units Subcutaneous QHS  . levothyroxine  50 mcg Oral QAC breakfast  . metoprolol succinate  25 mg Oral BID  . pantoprazole  40 mg Oral  BID  . sodium chloride flush  3 mL Intravenous Q12H

## 2017-03-12 LAB — GLUCOSE, CAPILLARY
GLUCOSE-CAPILLARY: 221 mg/dL — AB (ref 65–99)
GLUCOSE-CAPILLARY: 188 mg/dL — AB (ref 65–99)
GLUCOSE-CAPILLARY: 195 mg/dL — AB (ref 65–99)
Glucose-Capillary: 164 mg/dL — ABNORMAL HIGH (ref 65–99)

## 2017-03-12 LAB — BASIC METABOLIC PANEL
ANION GAP: 13 (ref 5–15)
BUN: 62 mg/dL — ABNORMAL HIGH (ref 6–20)
CALCIUM: 9.5 mg/dL (ref 8.9–10.3)
CO2: 29 mmol/L (ref 22–32)
Chloride: 91 mmol/L — ABNORMAL LOW (ref 101–111)
Creatinine, Ser: 9.3 mg/dL — ABNORMAL HIGH (ref 0.61–1.24)
GFR, EST AFRICAN AMERICAN: 7 mL/min — AB (ref >60)
GFR, EST NON AFRICAN AMERICAN: 6 mL/min — AB (ref >60)
GLUCOSE: 146 mg/dL — AB (ref 65–99)
POTASSIUM: 4.7 mmol/L (ref 3.5–5.1)
Sodium: 133 mmol/L — ABNORMAL LOW (ref 135–145)

## 2017-03-12 LAB — CBC
HCT: 34.5 % — ABNORMAL LOW (ref 39.0–52.0)
HEMOGLOBIN: 10.8 g/dL — AB (ref 13.0–17.0)
MCH: 29.1 pg (ref 26.0–34.0)
MCHC: 31.3 g/dL (ref 30.0–36.0)
MCV: 93 fL (ref 78.0–100.0)
PLATELETS: 268 10*3/uL (ref 150–400)
RBC: 3.71 MIL/uL — AB (ref 4.22–5.81)
RDW: 16.2 % — ABNORMAL HIGH (ref 11.5–15.5)
WBC: 7.2 10*3/uL (ref 4.0–10.5)

## 2017-03-12 MED ORDER — HEPARIN SODIUM (PORCINE) 1000 UNIT/ML DIALYSIS
9000.0000 [IU] | Freq: Once | INTRAMUSCULAR | Status: DC
  Filled 2017-03-12: qty 9

## 2017-03-12 MED ORDER — CALCITRIOL 0.5 MCG PO CAPS
ORAL_CAPSULE | ORAL | Status: AC
  Filled 2017-03-12: qty 2

## 2017-03-12 MED ORDER — CALCITRIOL 0.25 MCG PO CAPS
ORAL_CAPSULE | ORAL | Status: AC
  Filled 2017-03-12: qty 1

## 2017-03-12 NOTE — Procedures (Signed)
I was present at this dialysis session. I have reviewed the session itself and made appropriate changes.   UF goal 4.5L.  2K.  Ambient air. BP stable.  115.4kg today standing.  Outpt EDW is 112kg.    See how he does post HD. If dyspnea much improved can do next HD Wed as outpt. If still DOE then could do HD again in AM prior to DC.    Filed Weights   03/10/17 1500 03/11/17 0524 03/12/17 0435  Weight: 113 kg (249 lb 1.9 oz) 113.4 kg (250 lb) 115.3 kg (254 lb 3.2 oz)    Recent Labs  Lab 03/07/17 1109  03/12/17 0409  NA 131*   < > 133*  K 4.9   < > 4.7  CL 97*   < > 91*  CO2 20*   < > 29  GLUCOSE 171*   < > 146*  BUN 71*   < > 62*  CREATININE 9.11*   < > 9.30*  CALCIUM 8.5*   < > 9.5  PHOS 6.8*  --   --    < > = values in this interval not displayed.    Recent Labs  Lab 03/08/17 0341 03/09/17 0307 03/10/17 1310  WBC 8.1 6.7 7.0  NEUTROABS 5.6 4.5 4.9  HGB 10.0* 10.3* 11.9*  HCT 32.5* 32.4* 36.6*  MCV 92.6 93.1 92.7  PLT 282 279 304    Scheduled Meds: . aspirin EC  81 mg Oral Daily  . atorvastatin  80 mg Oral q1800  . calcitRIOL  1.25 mcg Oral Q M,W,F-HD  . calcium acetate  2,001 mg Oral TID WC  . cinacalcet  30 mg Oral Q M,W,F  . citalopram  40 mg Oral QHS  . dextromethorphan-guaiFENesin  1 tablet Oral BID  . fluticasone  1 spray Each Nare Daily  . gabapentin  200 mg Oral TID  . heparin  5,000 Units Subcutaneous Q8H  . heparin  7,000 Units Dialysis Once in dialysis  . insulin aspart  0-5 Units Subcutaneous QHS  . insulin aspart  0-9 Units Subcutaneous TID WC  . insulin glargine  20 Units Subcutaneous QHS  . levothyroxine  50 mcg Oral QAC breakfast  . metoprolol succinate  25 mg Oral BID  . pantoprazole  40 mg Oral BID  . sodium chloride flush  3 mL Intravenous Q12H   Continuous Infusions: . sodium chloride    . ferric gluconate (FERRLECIT/NULECIT) IV     PRN Meds:.sodium chloride, acetaminophen, calcium acetate, clonazePAM, HYDROmorphone, hydrOXYzine,  ipratropium-albuterol, metoCLOPramide, ondansetron (ZOFRAN) IV, sodium chloride flush   Russell Heckyan Emelio Schneller  MD 03/12/2017, 8:48 AM

## 2017-03-12 NOTE — Progress Notes (Signed)
PROGRESS NOTE  Russell [name]:[id number] DOB: July 01, 1970 DOA: 03/01/2017 PCP: [name], MD  HPI/Recap of past 24 hours: Russell Sheffield Silveris a 47 y.o.male with pmhx significant for anxiety, asthma, CAD, DVT, depression, gastroparesis, DM II, CIDP,  ESRD who presents with dyspnea & orthopnea X 2 weeks. CTA showed CAP. ECHO showed EF of 30% reduced from previous. Cardiology was consulted and recommended cath. Cardiac cath showed stable CA, suspect reduce EF is related to volume-macrovascular ischemia. Pt admitted for further management.  Today, pt reports feeling better, but still with some dyspnea. Denies any chest pain, abdominal pain, N/V, fever/chills. Advised to follow a strict fluid intake   Assessment/Plan: Active Problems:   Hypothyroidism (acquired)   Diabetic neuropathy (HCC)   Uncontrolled diabetes mellitus type 2 with peripheral artery disease (HCC)   Morbid obesity (HCC)   Essential hypertension, benign   ESRD on dialysis (HCC)   History of DVT of lower extremity   Quadriparesis (HCC)   CIDP (chronic inflammatory demyelinating polyneuropathy) (HCC)   CAP (community acquired pneumonia)  CAP with lung mass on CT Afebrile with no leukocytosis CT chest with PNA Completed 7 days of doxycycline and cefepime, continue nebs Of note patient was evaluated by Dr Isaiah Serge, pulmonologist 2-28 for lung mass, follow up, will need PET scan in 4-5 weeks  Speech evaluation, recommend regular diet, with thin liquid DG esophagram done 3/7 showed: poor esophageal motility without stricture or mass with Mild silent aspiration. 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 Cardiology on board: As pt is ESRD, pt will need more aggressive HD, strict fluid restriction  CAD/ hx of CABG/chest pain CT angio negative for PE Troponin times 2 negative.  Cath 03/06/17: Stable CAD, severely  diseased LAD, D1 and RI, widely patent grafts, normal RCA Continue with aspirin, Lipitor, metoprolol reduced  ESRD on HD Nephrology on board  Fluid management by HD  HTN Continue with Hydralazine prn, metoprolol reduced to 25mg  BID  DM type 2 Continue with lantus and SSI.   HLD Statins   Depression/Anxiety Cont celexa No SI/HI Prn klonopin & atarax  Hypothyroidism Continue with synthroid   CIDP Follows with Dr Anne Hahn. He gets IVIG prn  PT/OT  Gastroparesis Stable Reglan TID PRN  Chronic pain Change dilaudid to PRN.   Prolong QT Change azithro to doxy  Improved to 480     Code Status: Full  Family Communication: None at bedside  Disposition Plan: Once SOB improves with HD, likely d/c on 03/13/17 after HD   Consultants:  Cardiology  Nephrology  Procedures:  Cardiac cath  Antimicrobials:  S/P IV Cefepime, Doxycycline   DVT prophylaxis:  [place] heparin   Objective: Vitals:   03/12/17 1105 03/12/17 1135 03/12/17 1200 03/12/17 1430  BP: (!) 89/56 (!) 92/45 (!) 98/50 113/65  Pulse: 88 88 66 70  Resp: 16 15 18 20   Temp:   (!) 97.1 F (36.2 C) 98.1 F (36.7 C)  TempSrc:   Oral Oral  SpO2:   98% 99%  Weight:   114 kg (251 lb 5.2 oz)   Height:        Intake/Output Summary (Last 24 hours) at 03/12/2017 1628 Last data filed at 03/12/2017 1500 Gross per 24 hour  Intake 358 ml  Output 2200 ml  Net -1842 ml   Filed Weights   03/12/17 0435 03/12/17 0800  03/12/17 1200  Weight: 115.3 kg (254 lb 3.2 oz) 116.3 kg (256 lb 6.3 oz) 114 kg (251 lb 5.2 oz)    Exam:   General:  NAD, obese  Cardiovascular: RRR, S1, S2   Respiratory: CTA   Abdomen: Soft, obese, NT, ND, BS+  Musculoskeletal: No pedal edema b/l  Skin: Normal  Psychiatry: Normal affect   Data Reviewed: CBC: Recent Labs  Lab 03/07/17 0310 03/08/17 0341 03/09/17 0307 03/10/17 1310 03/12/17 0409  WBC 8.8 8.1 6.7 7.0 7.2  NEUTROABS  --  5.6 4.5 4.9  --   HGB  9.3* 10.0* 10.3* 11.9* 10.8*  HCT 29.6* 32.5* 32.4* 36.6* 34.5*  MCV 92.8 92.6 93.1 92.7 93.0  PLT 251 282 279 304 268   Basic Metabolic Panel: Recent Labs  Lab 03/07/17 1109 03/08/17 0341 03/09/17 0307 03/10/17 0337 03/11/17 0450 03/12/17 0409  NA 131* 132* 132* 134* 133* 133*  K 4.9 3.9 4.7 4.8 4.4 4.7  CL 97* 93* 91* 90* 94* 91*  CO2 20* 29 28 29 27 29   GLUCOSE 171* 129* 157* 165* 190* 146*  BUN 71* 33* 52* 33* 41* 62*  CREATININE 9.11* 5.77* 7.89* 6.04* 7.14* 9.30*  CALCIUM 8.5* 8.3* 9.1 9.7 9.5 9.5  PHOS 6.8*  --   --   --   --   --    GFR: Estimated Creatinine Clearance: 13.3 mL/min (A) (by C-G formula based on SCr of 9.3 mg/dL (H)). Liver Function Tests: Recent Labs  Lab 03/07/17 1109  ALBUMIN 3.1*   No results for input(s): LIPASE, AMYLASE in the last 168 hours. No results for input(s): AMMONIA in the last 168 hours. Coagulation Profile: Recent Labs  Lab 03/06/17 0459  INR 1.18   Cardiac Enzymes: No results for input(s): CKTOTAL, CKMB, CKMBINDEX, TROPONINI in the last 168 hours. BNP (last 3 results) No results for input(s): PROBNP in the last 8760 hours. HbA1C: No results for input(s): HGBA1C in the last 72 hours. CBG: Recent Labs  Lab 03/11/17 1132 03/11/17 1623 03/11/17 2023 03/12/17 0739 03/12/17 1319  GLUCAP 206* 215* 251* 221* 188*   Lipid Profile: No results for input(s): CHOL, HDL, LDLCALC, TRIG, CHOLHDL, LDLDIRECT in the last 72 hours. Thyroid Function Tests: No results for input(s): TSH, T4TOTAL, FREET4, T3FREE, THYROIDAB in the last 72 hours. Anemia Panel: No results for input(s): VITAMINB12, FOLATE, FERRITIN, TIBC, IRON, RETICCTPCT in the last 72 hours. Urine analysis:    Component Value Date/Time   COLORURINE YELLOW 05/18/2016 2110   APPEARANCEUR HAZY (A) 05/18/2016 2110   LABSPEC 1.018 05/18/2016 2110   PHURINE 7.0 05/18/2016 2110   GLUCOSEU >=500 (A) 05/18/2016 2110   HGBUR NEGATIVE 05/18/2016 2110   BILIRUBINUR NEGATIVE  05/18/2016 2110   BILIRUBINUR neg 05/26/2014 1956   KETONESUR NEGATIVE 05/18/2016 2110   PROTEINUR >=300 (A) 05/18/2016 2110   UROBILINOGEN 0.2 09/30/2014 1730   NITRITE NEGATIVE 05/18/2016 2110   LEUKOCYTESUR NEGATIVE 05/18/2016 2110   Sepsis Labs: @LABRCNTIP (procalcitonin:4,lacticidven:4)  ) No results found for this or any previous visit (from the past 240 hour(s)).    Studies: No results found.  Scheduled Meds: . aspirin EC  81 mg Oral Daily  . atorvastatin  80 mg Oral q1800  . calcitRIOL  1.25 mcg Oral Q M,W,F-HD  . calcium acetate  2,001 mg Oral TID WC  . cinacalcet  30 mg Oral Q M,W,F  . citalopram  40 mg Oral QHS  . dextromethorphan-guaiFENesin  1 tablet Oral BID  . fluticasone  1 spray  Each Nare Daily  . gabapentin  200 mg Oral TID  . heparin  5,000 Units Subcutaneous Q8H  . heparin  7,000 Units Dialysis Once in dialysis  . [START ON 03/13/2017] heparin  9,000 Units Dialysis Once in dialysis  . insulin aspart  0-5 Units Subcutaneous QHS  . insulin aspart  0-9 Units Subcutaneous TID WC  . insulin glargine  20 Units Subcutaneous QHS  . levothyroxine  50 mcg Oral QAC breakfast  . metoprolol succinate  25 mg Oral BID  . pantoprazole  40 mg Oral BID  . sodium chloride flush  3 mL Intravenous Q12H    Continuous Infusions: . sodium chloride    . ferric gluconate (FERRLECIT/NULECIT) IV       LOS: 10 days     Briant CedarNkeiruka J Bailea Beed, MD [facility]   If 7PM-7AM, please contact night-coverage [email or web address] Password [email or web address] 03/12/2017, 4:28 PM

## 2017-03-12 NOTE — Care Management Important Message (Signed)
Important Message  Patient Details  Name: Russell [name] MRN: [id number] Date of Birth: 1970/12/01   Medicare Important Message Given:  Yes    [name] P Teofil Maniaci 03/12/2017, 12:14 PM

## 2017-03-13 LAB — RENAL FUNCTION PANEL
Albumin: 3.2 g/dL — ABNORMAL LOW (ref 3.5–5.0)
Anion gap: 13 (ref 5–15)
BUN: 50 mg/dL — ABNORMAL HIGH (ref 6–20)
CALCIUM: 9.4 mg/dL (ref 8.9–10.3)
CHLORIDE: 94 mmol/L — AB (ref 101–111)
CO2: 25 mmol/L (ref 22–32)
CREATININE: 7.26 mg/dL — AB (ref 0.61–1.24)
GFR calc non Af Amer: 8 mL/min — ABNORMAL LOW (ref >60)
GFR, EST AFRICAN AMERICAN: 9 mL/min — AB (ref >60)
Glucose, Bld: 130 mg/dL — ABNORMAL HIGH (ref 65–99)
Phosphorus: 4.8 mg/dL — ABNORMAL HIGH (ref 2.5–4.6)
Potassium: 4.5 mmol/L (ref 3.5–5.1)
SODIUM: 132 mmol/L — AB (ref 135–145)

## 2017-03-13 LAB — CBC
HCT: 33.3 % — ABNORMAL LOW (ref 39.0–52.0)
HEMOGLOBIN: 10.7 g/dL — AB (ref 13.0–17.0)
MCH: 29.6 pg (ref 26.0–34.0)
MCHC: 32.1 g/dL (ref 30.0–36.0)
MCV: 92 fL (ref 78.0–100.0)
Platelets: 214 10*3/uL (ref 150–400)
RBC: 3.62 MIL/uL — AB (ref 4.22–5.81)
RDW: 16.2 % — ABNORMAL HIGH (ref 11.5–15.5)
WBC: 6.7 10*3/uL (ref 4.0–10.5)

## 2017-03-13 LAB — GLUCOSE, CAPILLARY: GLUCOSE-CAPILLARY: 164 mg/dL — AB (ref 65–99)

## 2017-03-13 MED ORDER — GABAPENTIN 100 MG PO CAPS: 200.0000 mg | ORAL_CAPSULE | Freq: Three times a day (TID) | ORAL | 0 refills | Status: DC

## 2017-03-13 MED ORDER — CALCIUM ACETATE (PHOS BINDER) 667 MG PO CAPS: 2001.0000 mg | ORAL_CAPSULE | Freq: Three times a day (TID) | ORAL | 0 refills | Status: AC

## 2017-03-13 MED ORDER — CALCITRIOL 0.25 MCG PO CAPS: 1.2500 ug | ORAL_CAPSULE | ORAL | 0 refills | Status: DC

## 2017-03-13 MED ORDER — PANTOPRAZOLE SODIUM 40 MG PO TBEC: 40.0000 mg | DELAYED_RELEASE_TABLET | Freq: Two times a day (BID) | ORAL | 0 refills | Status: DC

## 2017-03-13 MED ORDER — METOPROLOL SUCCINATE ER 25 MG PO TB24: 25.0000 mg | ORAL_TABLET | Freq: Two times a day (BID) | ORAL | 0 refills | Status: DC

## 2017-03-13 MED ORDER — FLUTICASONE PROPIONATE 50 MCG/ACT NA SUSP: 1.0000 | Freq: Every day | NASAL | 0 refills | Status: DC

## 2017-03-13 MED ORDER — HEPARIN SODIUM (PORCINE) 1000 UNIT/ML DIALYSIS: 8000.0000 [IU] | INTRAMUSCULAR | Status: DC | PRN

## 2017-03-13 MED ORDER — SODIUM CHLORIDE 0.9 % IV SOLN: 62.5000 mg | INTRAVENOUS | Status: DC

## 2017-03-13 NOTE — Procedures (Signed)
I was present at this dialysis session. I have reviewed the session itself and made appropriate changes.   Only 2.7 above EDW.  WIll inc UF goal today. Set post weight as new EDW.  2K bath. AVF.    OK For DC today post HD. [facility]  Filed Weights   03/12/17 0800 03/12/17 1200 03/13/17 0551  Weight: 116.3 kg (256 lb 6.3 oz) 114 kg (251 lb 5.2 oz) 114.1 kg (251 lb 8.7 oz)    Recent Labs  Lab 03/07/17 1109  03/12/17 0409  NA 131*   < > 133*  K 4.9   < > 4.7  CL 97*   < > 91*  CO2 20*   < > 29  GLUCOSE 171*   < > 146*  BUN 71*   < > 62*  CREATININE 9.11*   < > 9.30*  CALCIUM 8.5*   < > 9.5  PHOS 6.8*  --   --    < > = values in this interval not displayed.    Recent Labs  Lab 03/08/17 0341 03/09/17 0307 03/10/17 1310 03/12/17 0409  WBC 8.1 6.7 7.0 7.2  NEUTROABS 5.6 4.5 4.9  --   HGB 10.0* 10.3* 11.9* 10.8*  HCT 32.5* 32.4* 36.6* 34.5*  MCV 92.6 93.1 92.7 93.0  PLT 282 279 304 268    Scheduled Meds: . aspirin EC  81 mg Oral Daily  . atorvastatin  80 mg Oral q1800  . calcitRIOL  1.25 mcg Oral Q M,W,F-HD  . calcium acetate  2,001 mg Oral TID WC  . cinacalcet  30 mg Oral Q M,W,F  . citalopram  40 mg Oral QHS  . dextromethorphan-guaiFENesin  1 tablet Oral BID  . fluticasone  1 spray Each Nare Daily  . gabapentin  200 mg Oral TID  . heparin  5,000 Units Subcutaneous Q8H  . heparin  7,000 Units Dialysis Once in dialysis  . heparin  9,000 Units Dialysis Once in dialysis  . insulin aspart  0-5 Units Subcutaneous QHS  . insulin aspart  0-9 Units Subcutaneous TID WC  . insulin glargine  20 Units Subcutaneous QHS  . levothyroxine  50 mcg Oral QAC breakfast  . metoprolol succinate  25 mg Oral BID  . pantoprazole  40 mg Oral BID  . sodium chloride flush  3 mL Intravenous Q12H   Continuous Infusions: . sodium chloride    . ferric gluconate (FERRLECIT/NULECIT) IV     PRN Meds:.sodium chloride, acetaminophen, calcium acetate, clonazePAM,  HYDROmorphone, hydrOXYzine, ipratropium-albuterol, metoCLOPramide, ondansetron (ZOFRAN) IV, sodium chloride flush   Sabra Heckyan Lancer Thurner  MD 03/13/2017, 8:11 AM

## 2017-03-13 NOTE — Discharge Summary (Signed)
Discharge Summary  Russell [name]:[id number] DOB: [birth year]  PCP: [name], MD  Admit date: 03/01/2017 Discharge date: 03/13/2017  Time spent: > 30 mins  Recommendations for Outpatient Follow-up:  1. PCP 2. Nephrology  Discharge Diagnoses:  Active Hospital Problems   Diagnosis Date Noted  . CAP (community acquired pneumonia) 03/01/2017  . 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 Quadriparesis (HCC) 12/22/2016  . History of DVT of lower extremity 08/17/2016  . ESRD on dialysis (HCC) 02/13/2015  . Essential hypertension, benign   . Diabetic neuropathy (HCC) 09/21/2014  . Uncontrolled diabetes mellitus type 2 with peripheral artery disease (HCC) 09/21/2014  . Morbid obesity (HCC) 09/21/2014  . Hypothyroidism (acquired) 03/24/2014    Resolved Hospital Problems  No resolved problems to display.    Discharge Condition: Stable  Diet recommendation: Renal, mod carb, cardiac  Vitals:   03/13/17 1238 03/13/17 1449  BP: 116/75 111/64  Pulse:  72  Resp:    Temp:  98.1 F (36.7 C)  SpO2:  98%    History of present illness:  Russell [name] a 47 y.o.male with pmhx significant for anxiety, asthma, CAD, DVT, depression, gastroparesis, DM II, CIDP,ESRD who presents with dyspnea & orthopnea X 2 weeks. CTA showed CAP. ECHO showed EF of 30% reduced from previous. Cardiology was consulted and recommended cath. Cardiac cath showed stable CA, suspect reduce EF is related to volume-macrovascular ischemia. Pt admitted for further management.  Today, pt reports feeling better after HD. Denies any chest pain, abdominal pain, N/V, fever/chills. Advised to follow a strict fluid intake. Pt stable to be d/c  Hospital Course:  Active Problems:   Hypothyroidism (acquired)   Diabetic neuropathy (HCC)   Uncontrolled diabetes mellitus type 2 with peripheral artery disease (HCC)   Morbid obesity (HCC)   Essential hypertension, benign   ESRD on  dialysis (HCC)   History of DVT of lower extremity   Quadriparesis (HCC)   CIDP (chronic inflammatory demyelinating polyneuropathy) (HCC)   CAP (community acquired pneumonia)   CAP with lung mass on CT Afebrile with no leukocytosis CT chest with PNA Completed 7 days of doxycycline and cefepime, continue nebs Of note patient was evaluated by Dr Isaiah Serge, pulmonologist 2-28 for lung mass, follow up, will need PET scan in 4-5 weeks  Speech evaluation, recommend regular diet, with thin liquid DG esophagram done 3/7 showed: poor esophageal motility without stricture or mass with Mild silent aspiration. 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 Cardiology rec more aggressive HD, strict fluid restriction  CAD/ hx of CABG/chest pain CT angio negative for PE Troponin neg Cath 03/06/17: Stable CAD, severely diseased LAD, D1 and RI, widely patent grafts, normal RCA Continue with aspirin, Lipitor, metoprolol reduced due to low BP during HD  ESRD on HD Nephrology on board  Fluid management by HD  HTN Continue with Hydralazine prn, metoprolol reduced to 25mg  BID as mentioned above  DM type 2 Continue with home regimen   HLD Statins   Depression/Anxiety Cont celexa Prn klonopin & atarax  Hypothyroidism Continue with synthroid   CIDP Follows with Dr Anne Hahn. He gets IVIG prn   Gastroparesis Stable Reglan TID PRN  Chronic pain Continue narcotics, pcp consider tapering off pt from narcotics  Procedures:  Cardiac cath   Consultations:  Cardiology  Nephrology   Discharge Exam: BP 111/64 (BP Location: Right Arm)   Pulse 72   Temp 98.1 F (36.7 C) (Oral)   Resp 17   Ht 6\' 2"  (1.88 m)   Wt 112.6 kg (248 lb 3.8 oz) Comment: standing  SpO2  98%   BMI 31.87 kg/m   General: NAD  Cardiovascular: S1, S2 present Respiratory: CTA   Discharge Instructions You were cared for by a hospitalist during your hospital stay. If you have any questions about your discharge medications or the care you received while you were in the hospital after you are discharged, you can call the unit and asked to speak with the hospitalist on call if the hospitalist that took care of you is not available. Once you are discharged, your primary care physician will handle any further medical issues. Please note that NO REFILLS for any discharge medications will be authorized once you are discharged, as it is imperative that you return to your primary care physician (or establish a relationship with a primary care physician if you do not have one) for your aftercare needs so that they can reassess your need for medications and monitor your lab values.  Discharge Instructions    AMB Referral to [facility] Care Management   Complete by:  As directed    Please assign patient to pharmacist for assistance medication management with diabetes medication cost.  Charlesetta Shanks, RN BSN CCM [facility]  [phone] business mobile phone Toll free office [phone]   Reason for consult:  Medication management of diabetes/unable to afford medications   Diagnoses of:   Heart Failure COPD/ Pneumonia Diabetes Kidney Failure     Expected date of contact:  1-3 days (reserved for hospital discharges)     Allergies as of 03/13/2017      Reactions   Nsaids Other (See Comments)   Told to avoid all nsaids due to kidney disease    Tape Other (See Comments)   Welts result, if left for a long amount of time      Medication List    STOP taking these medications   levofloxacin 500 MG tablet Commonly known as:  LEVAQUIN   metoprolol tartrate 100 MG tablet Commonly known as:  LOPRESSOR     TAKE these medications   acetaminophen 500 MG tablet Commonly  known as:  TYLENOL Take 1 tablet (500 mg total) by mouth every 6 (six) hours as needed for moderate pain. What changed:    how much to take  reasons to take this   albuterol 108 (90 Base) MCG/ACT inhaler Commonly known as:  PROVENTIL HFA;VENTOLIN HFA Inhale 1 puff into the lungs every 6 (six) hours as needed for wheezing or shortness of breath. What changed:    how much to take  when to take this   albuterol (2.5 MG/3ML) 0.083% nebulizer solution Commonly known as:  PROVENTIL Take 3 mLs (2.5 mg total) by nebulization every 4 (four) hours as needed for wheezing. What changed:  Another medication with the same name was changed. Make sure you understand how and when to take each.   aspirin 81 MG EC tablet Take 1 tablet (81 mg total) by mouth daily.   atorvastatin 80 MG tablet Commonly known as:  LIPITOR Take 1 tablet (80 mg total) by mouth daily at 6 PM.   calcitRIOL 0.25 MCG capsule Commonly known as:  ROCALTROL Take 5 capsules (1.25 mcg total) by  mouth every Monday, Wednesday, and Friday with hemodialysis. Start taking on:  03/14/2017 What changed:  how much to take   calcium acetate 667 MG capsule Commonly known as:  PHOSLO Take 3 capsules (2,001 mg total) by mouth 3 (three) times daily with meals. What changed:  how much to take   cinacalcet 30 MG tablet Commonly known as:  SENSIPAR Take 30 mg by mouth every Monday, Wednesday, and Friday.   citalopram 40 MG tablet Commonly known as:  CELEXA Take 40 mg by mouth at bedtime.   clonazePAM 2 MG tablet Commonly known as:  KLONOPIN Take 2 mg by mouth as needed.   cyclobenzaprine 10 MG tablet Commonly known as:  FLEXERIL Take 1 tablet (10 mg total) 3 times/day as needed-between meals & bedtime by mouth for muscle spasms.   ferric gluconate 62.5 mg in sodium chloride 0.9 % 100 mL Inject 62.5 mg into the vein every Wednesday with hemodialysis. Start taking on:  03/14/2017   fluticasone 50 MCG/ACT nasal spray Commonly  known as:  FLONASE Place 1 spray into both nostrils daily. Start taking on:  03/14/2017   gabapentin 100 MG capsule Commonly known as:  NEURONTIN Take 2 capsules (200 mg total) by mouth 3 (three) times daily.   hydrALAZINE 25 MG tablet Commonly known as:  APRESOLINE Take 1 tablet (25 mg total) by mouth 3 (three) times daily as needed (take for blood pressure above 160 if not controlled with home meds).   HYDROcodone-acetaminophen 5-325 MG tablet Commonly known as:  NORCO/VICODIN Take 1-2 tablets by mouth every 4 (four) hours as needed for moderate pain.   HYDROmorphone 2 MG tablet Commonly known as:  DILAUDID Take 2 mg by mouth 3 (three) times daily.   hydrOXYzine 25 MG tablet Commonly known as:  ATARAX/VISTARIL Take 25 mg by mouth 2 (two) times daily as needed for anxiety.   insulin glargine 100 UNIT/ML injection Commonly known as:  LANTUS Inject 0.3 mLs (30 Units total) into the skin at bedtime. Pens   insulin lispro 100 UNIT/ML injection Commonly known as:  HUMALOG Inject 0.15-0.3 mLs (15-30 Units total) into the skin every evening. Take one unit per 5 carbs depending on dinner.   levothyroxine 50 MCG tablet Commonly known as:  SYNTHROID, LEVOTHROID Take 1 tablet (50 mcg total) by mouth daily before breakfast.   liraglutide 18 MG/3ML Sopn Commonly known as:  VICTOZA Inject 0.3 mLs (1.8 mg total) into the skin daily before breakfast.   metoCLOPramide 5 MG tablet Commonly known as:  REGLAN Take 1 tablet (5 mg total) by mouth every 8 (eight) hours as needed for nausea or vomiting. What changed:    how much to take  when to take this   metoprolol succinate 25 MG 24 hr tablet Commonly known as:  TOPROL-XL Take 1 tablet (25 mg total) by mouth 2 (two) times daily.   ondansetron 4 MG tablet Commonly known as:  ZOFRAN Take 1 tablet (4 mg total) by mouth 3 (three) times daily as needed for nausea or vomiting.   pantoprazole 40 MG tablet Commonly known as:   PROTONIX Take 1 tablet (40 mg total) by mouth 2 (two) times daily.   PRIVIGEN 40 GM/400ML Soln Generic drug:  Immune Globulin (Human) Inject 40 g into the vein. 40gm/dayx 5 days and then 40gm q4 weeks      Allergies  Allergen Reactions  . Nsaids Other (See Comments)    Told to avoid all nsaids due to kidney disease   .  Tape Other (See Comments)    Welts result, if left for a long amount of time      The results of significant diagnostics from this hospitalization (including imaging, microbiology, ancillary and laboratory) are listed below for reference.    Significant Diagnostic Studies: Dg Chest 2 View  Result Date: 03/01/2017 CLINICAL DATA:  Cough EXAM: CHEST  2 VIEW COMPARISON:  10/13/2016 chest radiograph. FINDINGS: Intact sternotomy wires. Stable cardiomediastinal silhouette with borderline mild cardiomegaly. No pneumothorax. No pleural effusion. Slight prominence of the interstitial markings. No acute consolidative airspace disease. IMPRESSION: Borderline mild cardiomegaly and slight prominence of the interstitial markings. Mild congestive heart failure is not excluded. Electronically Signed   By: Delbert Phenix M.D.   On: 03/01/2017 16:02   Ct Chest Wo Contrast  Result Date: 02/22/2017 CLINICAL DATA:  Shortness of breath. Pulmonary nodule. Smoking history. EXAM: CT CHEST WITHOUT CONTRAST TECHNIQUE: Multidetector CT imaging of the chest was performed following the standard protocol without IV contrast. COMPARISON:  10/14/2016 FINDINGS: Cardiovascular: Normal caliber of the thoracic aorta. Prior CABG. LAD and left circumflex coronary artery atherosclerosis. Normal heart size. No pericardial effusion. Mediastinum/Nodes: Mildly enlarged mediastinal lymph nodes are stable to minimally smaller than on the prior study, with the largest measuring 12 mm in short axis in the right paratracheal station. No enlarged axillary or hilar lymph nodes are identified. The esophagus and included thyroid  are grossly unremarkable. Lungs/Pleura: No pleural effusion or pneumothorax. There is a new area of masslike consolidation in the central right upper lobe measuring 3.1 x 2.3 cm with mild patchy ground-glass opacity more peripherally. Minimal scattered tree-in-bud nodularity is noted in the lower lobes. There is a background of slight mosaic attenuation throughout the lungs. Upper Abdomen: Small calcification in the spleen. Musculoskeletal: No suspicious osseous lesion. IMPRESSION: 1. 3 cm masslike opacity in the central right upper lobe, new from 10/2016. Considerations include pneumonia and a rapidly growing primary lung cancer or metastasis. If the patient has symptoms of pulmonary infection, consider short-term follow-up CT in 6 weeks following antimicrobial therapy to assess for improvement. Otherwise, consider PET-CT and tissue sampling. 2. Stable to minimally decreased size of mediastinal lymph nodes. These results will be called to the ordering clinician or representative by the Radiologist Assistant, and communication documented in the PACS or zVision Dashboard. Electronically Signed   By: Sebastian Ache M.D.   On: 02/22/2017 13:40   Ct Angio Chest Pe W And/or Wo Contrast  Result Date: 03/01/2017 CLINICAL DATA:  Nausea and hypertension this morning. High pretest probability for pulmonary embolism. History of nephrotic syndrome, on dialysis. EXAM: CT ANGIOGRAPHY CHEST WITH CONTRAST TECHNIQUE: Multidetector CT imaging of the chest was performed using the standard protocol during bolus administration of intravenous contrast. Multiplanar CT image reconstructions and MIPs were obtained to evaluate the vascular anatomy. CONTRAST:  ISOVUE-370 IOPAMIDOL (ISOVUE-370) INJECTION 76% COMPARISON:  02/22/2017 FINDINGS: Cardiovascular: Satisfactory opacification of the pulmonary arteries to the segmental level. No evidence of pulmonary embolism. Cardiomegaly with hypertrophic appearance of the left ventricle.  Atherosclerotic calcification, status post CABG. Detection of graft patency is limited due to early aortic opacification. Mediastinum/Nodes: Borderline right paratracheal lymph node size that is stable at up to 12 mm in short axis. Lungs/Pleura: Focal airspace opacity in the right middle lobe persists but appears slightly decreased in bulk and there is slightly decreased regional airspace opacity. Pneumonia is favored over a mass. Upper Abdomen: Negative Musculoskeletal: No acute or aggressive finding. Review of the MIP images confirms the  above findings. IMPRESSION: 1. Negative for pulmonary embolism. 2. Focal opacity in the right middle lobe as described on chest CT 7 days prior. There has been subtle decrease in volume favoring improving pneumonia over a neoplasm. This finding is occult radiographically; recommend chest CT follow-up in 6-12 weeks. 3. Cardiomegaly and atherosclerosis. Electronically Signed   By: Marnee SpringJonathon  Watts M.D.   On: 03/01/2017 15:58   Dg Esophagus  Result Date: 03/08/2017 CLINICAL DATA:  Dysphagia.  Pneumonia EXAM: ESOPHOGRAM / BARIUM SWALLOW / BARIUM TABLET STUDY TECHNIQUE: Combined double contrast and single contrast examination performed using effervescent crystals, thick barium liquid, and thin barium liquid. The patient was observed with fluoroscopy swallowing a 13 mm barium sulphate tablet. FLUOROSCOPY TIME:  Fluoroscopy Time:  2 minutes 0 second Radiation Exposure Index (if provided by the fluoroscopic device): Number of Acquired Spot Images: 0 COMPARISON:  Chest CT 03/01/2017 FINDINGS: Poor esophageal motility with numerous tertiary contractions noted. Negative for stricture or mass. Negative for hiatal hernia. Small amount of silent aspiration on 1 attempt of swallowing. Barium tablet initially became lodged in the mid esophagus and then passed with additional swallows of water. No stricture is seen in this area. IMPRESSION: Poor esophageal motility without stricture or mass Mild  silent aspiration. Electronically Signed   By: Marlan Palauharles  Clark M.D.   On: 03/08/2017 14:11   Dg Swallowing Func-speech Pathology  Result Date: 03/09/2017 Objective Swallowing Evaluation: Type of Study: MBS-Modified Barium Swallow Study  Patient Details Name: Fonnie BirkenheadSteven B Doeden MRN: [id number] Date of Birth: [birth year] Today's Date: 03/09/2017 Time: SLP Start Time (ACUTE ONLY): 1345 -SLP Stop Time (ACUTE ONLY): 1405 SLP Time Calculation (min) (ACUTE ONLY): 20 min Past Medical History: Past Medical History: Diagnosis Date . Anxiety  . Asthma  . CIDP (chronic inflammatory demyelinating polyneuropathy) (HCC) 01/10/2017 . Coronary artery disease involving native coronary artery of native heart with unstable angina pectoris (HCC)   80% LAD-95% oD1 bifurcation lesion & 90% RI --> referred for CABG . Daily headache  . Depression  . DVT (deep venous thrombosis), H/o 01/2014-on Xarelto 03/24/2014  LLE . ESRD (end stage renal disease) on dialysis [facility](HCC)   "[place], MWF" (06/29/2016) . Gait abnormality 12/22/2016 . Gastroparesis 12/22/2016 . Hypertension  . Hypothyroidism  . Nephrotic syndrome 05/18/2014 . Neuropathy  . Type 2 diabetes mellitus with diabetic nephropathy [facility](HCC)  Past Surgical History: Past Surgical History: Procedure Laterality Date . ANKLE FRACTURE SURGERY Right 1988 . AV FISTULA PLACEMENT Left 01/01/2015  Procedure: CREATION OF LEFT RADIAL CEPHALIC ARTERIOVENOUS (AV) FISTULA ;  Surgeon: Pryor OchoaJames D Lawson, MD;  [facility];  Service: Vascular;  Laterality: Left; . CAPD REMOVAL N/A 05/19/2016  Procedure: PD CATH REMOVAL;  Surgeon: Abigail MiyamotoBlackman, Douglas, MD;  [facility];  Service: General;  Laterality: N/A; . CARDIAC CATHETERIZATION N/A 09/22/2015  Procedure: Left Heart Cath and Coronary Angiography;  Surgeon: Iran OuchMuhammad A Arida, MD;  Location: [facility] INVASIVE CV LAB;  Service: Cardiovascular;  Laterality: N/A; . CORONARY ARTERY BYPASS GRAFT N/A 09/28/2015  Procedure: CORONARY ARTERY BYPASS GRAFTING (CABG) x 3 UTILIZING LEFT  MAMMARY ARTERY AND ENDOSCOPICALLY HARVESTED LEFT GREATER SAPHENOUS VEIN.;  Surgeon: Delight OvensEdward B Gerhardt, MD;  Location: [facility] OR;  Service: Open Heart Surgery;  Laterality: N/A; . ENDOVEIN HARVEST OF GREATER SAPHENOUS VEIN Left 09/28/2015  Procedure: ENDOVEIN HARVEST OF GREATER SAPHENOUS VEIN;  Surgeon: Delight OvensEdward B Gerhardt, MD;  [facility];  Service: Open Heart Surgery;  Laterality: Left; . ESOPHAGOGASTRODUODENOSCOPY N/A 03/29/2014  Procedure: ESOPHAGOGASTRODUODENOSCOPY (EGD);  Surgeon: Dorena CookeyJohn Hayes, MD;  Location: [facility] ENDOSCOPY;  Service: Endoscopy;  Laterality: N/A; . EYE SURGERY   . FRACTURE SURGERY   . INSERTION OF DIALYSIS CATHETER Right 01/05/2015  Procedure: INSERTION OF RIGHT INTERNAL JUGULAR DIALYSIS CATHETER;  Surgeon: Fransisco Hertz, MD;  Location: [facility] OR;  Service: Vascular;  Laterality: Right; . LEFT HEART CATH AND CORS/GRAFTS ANGIOGRAPHY N/A 09/13/2016  Procedure: LEFT HEART CATH AND CORS/GRAFTS ANGIOGRAPHY;  Surgeon: Kathleene Hazel, MD;  Location: [facility] INVASIVE CV LAB;  Service: Cardiovascular;  Laterality: N/A; . RETINAL LASER PROCEDURE Bilateral  . RIGHT/LEFT HEART CATH AND CORONARY/GRAFT ANGIOGRAPHY N/A 03/06/2017  Procedure: RIGHT/LEFT HEART CATH AND CORONARY/GRAFT ANGIOGRAPHY;  Surgeon: Marykay Lex, MD;  [facility] INVASIVE CV LAB;  Service: Cardiovascular;  Laterality: N/A; . TEE WITHOUT CARDIOVERSION N/A 09/28/2015  Procedure: TRANSESOPHAGEAL ECHOCARDIOGRAM (TEE);  Surgeon: Delight Ovens, MD;  Location: [facility] OR;  Service: Open Heart Surgery;  Laterality: N/A; . TONSILLECTOMY AND ADENOIDECTOMY  1970s HPI: LISANDRO MEGGETT is a 47 y.o. male With pmhx significant for anxiety, asthma, CAD, DVT, depression, gastroparesis, severe erosive esophagitis, DM II who presented with SOB. He was recently diagnosed with CIDP. CTA 03/01/17 showed pna. Per MD notes, pt reports coughing while eating. EGD 03/29/14 findings of severe exudative circumferential esophagitis, appearance consistent with severe reflux.   Subjective: Pt alert and agreeable to testing Assessment / Plan / Recommendation CHL IP CLINICAL IMPRESSIONS 03/09/2017 Clinical Impression Pt presents with one occurrence of slight penetration with initial, larger sip of thin liquid. Partial amount of penetrates cleared from laryngeal spontaneously, with remainder cleared following cued throat clear from SLP. No other observed airway compromise observed despite challenging with consecutive sips with straw and using mixed consistencies (pill and thin liquid). Pt had mild amount of vallecular residue with solid trials that he cleared spontaneously with subsequent swallows. Oral phase was significant only for min premature spillage with thin liquids. Esophagus scan during pill trial revealed suspected reduced pill propulsion through esophagus consistent with results from esophagram yesterday. Recommend continuing regular solids and thin liquid diet with aspiration and esophageal precautions. Recommend possible follow up with GI. SLP services no longer indicated, please re-consult in the event of acute changes.  SLP Visit Diagnosis Dysphagia, unspecified (R13.10) Attention and concentration deficit following -- Frontal lobe and executive function deficit following -- Impact on safety and function Mild aspiration risk   CHL IP TREATMENT RECOMMENDATION 03/09/2017 Treatment Recommendations No treatment recommended at this time   Prognosis 03/09/2017 Prognosis for Safe Diet Advancement Good Barriers to Reach Goals -- Barriers/Prognosis Comment -- CHL IP DIET RECOMMENDATION 03/09/2017 SLP Diet Recommendations Regular solids;Thin liquid Liquid Administration via Straw;Cup Medication Administration Whole meds with liquid Compensations Slow rate;Small sips/bites;Follow solids with liquid Postural Changes Seated upright at 90 degrees;Remain semi-upright after after feeds/meals (Comment)   CHL IP OTHER RECOMMENDATIONS 03/09/2017 Recommended Consults Consider GI evaluation Oral Care  Recommendations Oral care BID Other Recommendations --   CHL IP FOLLOW UP RECOMMENDATIONS 03/09/2017 Follow up Recommendations None   CHL IP FREQUENCY AND DURATION 03/03/2017 Speech Therapy Frequency (ACUTE ONLY) min 1 x/week Treatment Duration 1 week      CHL IP ORAL PHASE 03/09/2017 Oral Phase Impaired Oral - Pudding Teaspoon -- Oral - Pudding Cup -- Oral - Honey Teaspoon -- Oral - Honey Cup -- Oral - Nectar Teaspoon -- Oral - Nectar Cup -- Oral - Nectar Straw -- Oral - Thin Teaspoon -- Oral - Thin Cup Premature spillage Oral - Thin Straw Premature spillage Oral - Puree WFL Oral -  Mech Soft -- Oral - Regular WFL Oral - Multi-Consistency -- Oral - Pill Reduced posterior propulsion Oral Phase - Comment --  CHL IP PHARYNGEAL PHASE 03/09/2017 Pharyngeal Phase Impaired Pharyngeal- Pudding Teaspoon -- Pharyngeal -- Pharyngeal- Pudding Cup -- Pharyngeal -- Pharyngeal- Honey Teaspoon -- Pharyngeal -- Pharyngeal- Honey Cup -- Pharyngeal -- Pharyngeal- Nectar Teaspoon -- Pharyngeal -- Pharyngeal- Nectar Cup -- Pharyngeal -- Pharyngeal- Nectar Straw -- Pharyngeal -- Pharyngeal- Thin Teaspoon -- Pharyngeal -- Pharyngeal- Thin Cup Penetration/Aspiration during swallow;Other (Comment) Pharyngeal Material enters airway, remains ABOVE vocal cords and not ejected out Pharyngeal- Thin Straw WFL Pharyngeal -- Pharyngeal- Puree Pharyngeal residue - valleculae;Reduced tongue base retraction Pharyngeal -- Pharyngeal- Mechanical Soft -- Pharyngeal -- Pharyngeal- Regular Pharyngeal residue - valleculae;Reduced tongue base retraction Pharyngeal -- Pharyngeal- Multi-consistency -- Pharyngeal -- Pharyngeal- Pill WFL Pharyngeal -- Pharyngeal Comment --  CHL IP CERVICAL ESOPHAGEAL PHASE 03/09/2017 Cervical Esophageal Phase WFL Pudding Teaspoon -- Pudding Cup -- Honey Teaspoon -- Honey Cup -- Nectar Teaspoon -- Nectar Cup -- Nectar Straw -- Thin Teaspoon -- Thin Cup -- Thin Straw -- Puree -- Mechanical Soft -- Regular -- Multi-consistency -- Pill --  Cervical Esophageal Comment -- No flowsheet data found. Maxcine Ham 03/09/2017, 4:35 PM  Maxcine Ham, M.A. CCC-SLP [phone]              Microbiology: No results found for this or any previous visit (from the past 240 hour(s)).   Labs: Basic Metabolic Panel: Recent Labs  Lab 03/07/17 1109  03/09/17 0307 03/10/17 0337 03/11/17 0450 03/12/17 0409 03/13/17 0803  NA 131*   < > 132* 134* 133* 133* 132*  K 4.9   < > 4.7 4.8 4.4 4.7 4.5  CL 97*   < > 91* 90* 94* 91* 94*  CO2 20*   < > 28 29 27 29 25   GLUCOSE 171*   < > 157* 165* 190* 146* 130*  BUN 71*   < > 52* 33* 41* 62* 50*  CREATININE 9.11*   < > 7.89* 6.04* 7.14* 9.30* 7.26*  CALCIUM 8.5*   < > 9.1 9.7 9.5 9.5 9.4  PHOS 6.8*  --   --   --   --   --  4.8*   < > = values in this interval not displayed.   Liver Function Tests: Recent Labs  Lab 03/07/17 1109 03/13/17 0803  ALBUMIN 3.1* 3.2*   No results for input(s): LIPASE, AMYLASE in the last 168 hours. No results for input(s): AMMONIA in the last 168 hours. CBC: Recent Labs  Lab 03/08/17 0341 03/09/17 0307 03/10/17 1310 03/12/17 0409 03/13/17 0803  WBC 8.1 6.7 7.0 7.2 6.7  NEUTROABS 5.6 4.5 4.9  --   --   HGB 10.0* 10.3* 11.9* 10.8* 10.7*  HCT 32.5* 32.4* 36.6* 34.5* 33.3*  MCV 92.6 93.1 92.7 93.0 92.0  PLT 282 279 304 268 214   Cardiac Enzymes: No results for input(s): CKTOTAL, CKMB, CKMBINDEX, TROPONINI in the last 168 hours. BNP: BNP (last 3 results) Recent Labs    05/16/16 1740 05/23/16 1558  BNP 662.2* 299.0*    ProBNP (last 3 results) No results for input(s): PROBNP in the last 8760 hours.  CBG: Recent Labs  Lab 03/12/17 0739 03/12/17 1319 03/12/17 1657 03/12/17 2128 03/13/17 1155  GLUCAP 221* 188* 195* 164* 164*       Signed:  Briant Cedar, MD [facility] 03/13/2017, 6:19 PM

## 2017-03-13 NOTE — Progress Notes (Signed)
Reviewed discharge paperwork and new medications with patient, no further questions. IVs removed successfully. PT d/c to home.

## 2017-03-14 DIAGNOSIS — N186 End stage renal disease: Secondary | ICD-10-CM | POA: Diagnosis not present

## 2017-03-14 DIAGNOSIS — E1129 Type 2 diabetes mellitus with other diabetic kidney complication: Secondary | ICD-10-CM | POA: Diagnosis not present

## 2017-03-14 DIAGNOSIS — D509 Iron deficiency anemia, unspecified: Secondary | ICD-10-CM | POA: Diagnosis not present

## 2017-03-14 DIAGNOSIS — N2581 Secondary hyperparathyroidism of renal origin: Secondary | ICD-10-CM | POA: Diagnosis not present

## 2017-03-14 DIAGNOSIS — D631 Anemia in chronic kidney disease: Secondary | ICD-10-CM | POA: Diagnosis not present

## 2017-03-15 DIAGNOSIS — H4311 Vitreous hemorrhage, right eye: Secondary | ICD-10-CM | POA: Diagnosis not present

## 2017-03-15 DIAGNOSIS — E113591 Type 2 diabetes mellitus with proliferative diabetic retinopathy without macular edema, right eye: Secondary | ICD-10-CM | POA: Diagnosis not present

## 2017-03-15 DIAGNOSIS — H35371 Puckering of macula, right eye: Secondary | ICD-10-CM | POA: Diagnosis not present

## 2017-03-15 DIAGNOSIS — E113511 Type 2 diabetes mellitus with proliferative diabetic retinopathy with macular edema, right eye: Secondary | ICD-10-CM | POA: Diagnosis not present

## 2017-03-16 ENCOUNTER — Other Ambulatory Visit: Payer: Self-pay | Admitting: Pharmacist

## 2017-03-16 DIAGNOSIS — H4311 Vitreous hemorrhage, right eye: Secondary | ICD-10-CM | POA: Diagnosis not present

## 2017-03-16 DIAGNOSIS — E113511 Type 2 diabetes mellitus with proliferative diabetic retinopathy with macular edema, right eye: Secondary | ICD-10-CM | POA: Diagnosis not present

## 2017-03-16 NOTE — Patient Outreach (Signed)
[facility] Care Management  03/16/2017  Fonnie BirkenheadSteven B Haberland [birth year] [id number]  [facility] received [facility] from [facility] Liaison [place], patient requests medication patient assistance evaluation.   Successful phone outreach to patient---HIPAA details verified, purpose of call explained and he agreed to call.   He reports his medications of cost concern are insulin---reports he currently uses Novolog and used to take Lantus, and Victoza.    Counseled patient Victoza is not insulin and is actually a different medication which helps to lower blood sugar.  He reports Lantus is not covered by his Part D.   Appears patient has [employer] Part D---he confirms.    Patient assistance:   Per review of preferred drug list for this Part D plan---Lantus appears to be not covered.  Basaglar (insulin glargine) and Levemir appear to be Tier 2 ($18/30 day supply).  Victoza appears to be Tier 3 ($45/30 day supply)    Discussed patient's co-pays per plan summary of benefits.   Discussed Medication Part D coverage gap.    Discussed patient assistance programs via [employer] and [employer].    Patient reports he missed last appointment with Dr Elvera LennoxGherghe due to hospitalization---he was encouraged to reschedule.    He reports he is attempting to get [employer] benefits but denies having at this time.   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    Plan:  Patient to contact his pharmacy to determine year to date out-of-pocket prescription spend.  Will send in-basket message to Dr Elvera LennoxGherghe regarding formulary alternatives to Lantus.    Will make follow-up attempt to patient next week.   Tommye StandardKevin Yitzchok Carriger, PharmD, [facility] Center[facility]ACP Clinical Pharmacist [facility] [phone]

## 2017-03-17 DIAGNOSIS — N2581 Secondary hyperparathyroidism of renal origin: Secondary | ICD-10-CM | POA: Diagnosis not present

## 2017-03-17 DIAGNOSIS — N186 End stage renal disease: Secondary | ICD-10-CM | POA: Diagnosis not present

## 2017-03-17 DIAGNOSIS — D631 Anemia in chronic kidney disease: Secondary | ICD-10-CM | POA: Diagnosis not present

## 2017-03-17 DIAGNOSIS — D509 Iron deficiency anemia, unspecified: Secondary | ICD-10-CM | POA: Diagnosis not present

## 2017-03-17 DIAGNOSIS — E1129 Type 2 diabetes mellitus with other diabetic kidney complication: Secondary | ICD-10-CM | POA: Diagnosis not present

## 2017-03-19 ENCOUNTER — Inpatient Hospital Stay (HOSPITAL_COMMUNITY)
Admission: EM | Admit: 2017-03-19 | Discharge: 2017-03-22 | DRG: 637 | Disposition: A | Discharge status: Home / Self Care | Payer: Medicare Other | Attending: Internal Medicine | Admitting: Internal Medicine

## 2017-03-19 ENCOUNTER — Encounter (HOSPITAL_COMMUNITY): Payer: Self-pay

## 2017-03-19 ENCOUNTER — Emergency Department (HOSPITAL_COMMUNITY): Payer: Medicare Other

## 2017-03-19 ENCOUNTER — Other Ambulatory Visit: Payer: Self-pay

## 2017-03-19 DIAGNOSIS — E1110 Type 2 diabetes mellitus with ketoacidosis without coma: Secondary | ICD-10-CM | POA: Diagnosis not present

## 2017-03-19 DIAGNOSIS — E1010 Type 1 diabetes mellitus with ketoacidosis without coma: Secondary | ICD-10-CM | POA: Diagnosis not present

## 2017-03-19 DIAGNOSIS — E860 Dehydration: Secondary | ICD-10-CM | POA: Diagnosis present

## 2017-03-19 DIAGNOSIS — D631 Anemia in chronic kidney disease: Secondary | ICD-10-CM | POA: Diagnosis present

## 2017-03-19 DIAGNOSIS — N2581 Secondary hyperparathyroidism of renal origin: Secondary | ICD-10-CM | POA: Diagnosis present

## 2017-03-19 DIAGNOSIS — R000 Tachycardia, unspecified: Secondary | ICD-10-CM | POA: Diagnosis present

## 2017-03-19 DIAGNOSIS — E861 Hypovolemia: Secondary | ICD-10-CM | POA: Diagnosis present

## 2017-03-19 DIAGNOSIS — K219 Gastro-esophageal reflux disease without esophagitis: Secondary | ICD-10-CM | POA: Diagnosis present

## 2017-03-19 DIAGNOSIS — K3184 Gastroparesis: Secondary | ICD-10-CM | POA: Diagnosis present

## 2017-03-19 DIAGNOSIS — E785 Hyperlipidemia, unspecified: Secondary | ICD-10-CM | POA: Diagnosis present

## 2017-03-19 DIAGNOSIS — E1140 Type 2 diabetes mellitus with diabetic neuropathy, unspecified: Secondary | ICD-10-CM | POA: Diagnosis present

## 2017-03-19 DIAGNOSIS — N186 End stage renal disease: Secondary | ICD-10-CM | POA: Diagnosis present

## 2017-03-19 DIAGNOSIS — F419 Anxiety disorder, unspecified: Secondary | ICD-10-CM | POA: Diagnosis present

## 2017-03-19 DIAGNOSIS — R1110 Vomiting, unspecified: Secondary | ICD-10-CM

## 2017-03-19 DIAGNOSIS — I10 Essential (primary) hypertension: Secondary | ICD-10-CM | POA: Diagnosis not present

## 2017-03-19 DIAGNOSIS — IMO0002 Reserved for concepts with insufficient information to code with codable children: Secondary | ICD-10-CM | POA: Diagnosis present

## 2017-03-19 DIAGNOSIS — F329 Major depressive disorder, single episode, unspecified: Secondary | ICD-10-CM | POA: Diagnosis present

## 2017-03-19 DIAGNOSIS — Z86718 Personal history of other venous thrombosis and embolism: Secondary | ICD-10-CM

## 2017-03-19 DIAGNOSIS — R0682 Tachypnea, not elsewhere classified: Secondary | ICD-10-CM | POA: Diagnosis present

## 2017-03-19 DIAGNOSIS — Z951 Presence of aortocoronary bypass graft: Secondary | ICD-10-CM

## 2017-03-19 DIAGNOSIS — Z9114 Patient's other noncompliance with medication regimen: Secondary | ICD-10-CM

## 2017-03-19 DIAGNOSIS — E1165 Type 2 diabetes mellitus with hyperglycemia: Secondary | ICD-10-CM | POA: Diagnosis not present

## 2017-03-19 DIAGNOSIS — E1129 Type 2 diabetes mellitus with other diabetic kidney complication: Secondary | ICD-10-CM | POA: Diagnosis not present

## 2017-03-19 DIAGNOSIS — J45909 Unspecified asthma, uncomplicated: Secondary | ICD-10-CM | POA: Diagnosis present

## 2017-03-19 DIAGNOSIS — I120 Hypertensive chronic kidney disease with stage 5 chronic kidney disease or end stage renal disease: Secondary | ICD-10-CM | POA: Diagnosis present

## 2017-03-19 DIAGNOSIS — Z992 Dependence on renal dialysis: Secondary | ICD-10-CM

## 2017-03-19 DIAGNOSIS — Z9109 Other allergy status, other than to drugs and biological substances: Secondary | ICD-10-CM

## 2017-03-19 DIAGNOSIS — Z886 Allergy status to analgesic agent status: Secondary | ICD-10-CM

## 2017-03-19 DIAGNOSIS — R112 Nausea with vomiting, unspecified: Secondary | ICD-10-CM | POA: Diagnosis not present

## 2017-03-19 DIAGNOSIS — Z794 Long term (current) use of insulin: Secondary | ICD-10-CM

## 2017-03-19 DIAGNOSIS — R1115 Cyclical vomiting syndrome unrelated to migraine: Secondary | ICD-10-CM

## 2017-03-19 DIAGNOSIS — E7841 Elevated Lipoprotein(a): Secondary | ICD-10-CM | POA: Diagnosis not present

## 2017-03-19 DIAGNOSIS — R51 Headache: Secondary | ICD-10-CM | POA: Diagnosis present

## 2017-03-19 DIAGNOSIS — E039 Hypothyroidism, unspecified: Secondary | ICD-10-CM | POA: Diagnosis present

## 2017-03-19 DIAGNOSIS — I2510 Atherosclerotic heart disease of native coronary artery without angina pectoris: Secondary | ICD-10-CM | POA: Diagnosis present

## 2017-03-19 DIAGNOSIS — G6181 Chronic inflammatory demyelinating polyneuritis: Secondary | ICD-10-CM | POA: Diagnosis present

## 2017-03-19 DIAGNOSIS — Z7951 Long term (current) use of inhaled steroids: Secondary | ICD-10-CM

## 2017-03-19 DIAGNOSIS — K56609 Unspecified intestinal obstruction, unspecified as to partial versus complete obstruction: Secondary | ICD-10-CM

## 2017-03-19 DIAGNOSIS — E118 Type 2 diabetes mellitus with unspecified complications: Secondary | ICD-10-CM | POA: Diagnosis not present

## 2017-03-19 DIAGNOSIS — E1143 Type 2 diabetes mellitus with diabetic autonomic (poly)neuropathy: Secondary | ICD-10-CM | POA: Diagnosis not present

## 2017-03-19 DIAGNOSIS — R109 Unspecified abdominal pain: Secondary | ICD-10-CM | POA: Diagnosis not present

## 2017-03-19 DIAGNOSIS — E1151 Type 2 diabetes mellitus with diabetic peripheral angiopathy without gangrene: Secondary | ICD-10-CM | POA: Diagnosis present

## 2017-03-19 DIAGNOSIS — Z79899 Other long term (current) drug therapy: Secondary | ICD-10-CM

## 2017-03-19 DIAGNOSIS — D509 Iron deficiency anemia, unspecified: Secondary | ICD-10-CM | POA: Diagnosis not present

## 2017-03-19 DIAGNOSIS — R030 Elevated blood-pressure reading, without diagnosis of hypertension: Secondary | ICD-10-CM | POA: Diagnosis not present

## 2017-03-19 DIAGNOSIS — Z7982 Long term (current) use of aspirin: Secondary | ICD-10-CM

## 2017-03-19 DIAGNOSIS — I252 Old myocardial infarction: Secondary | ICD-10-CM

## 2017-03-19 DIAGNOSIS — E1122 Type 2 diabetes mellitus with diabetic chronic kidney disease: Secondary | ICD-10-CM | POA: Diagnosis present

## 2017-03-19 DIAGNOSIS — Z87441 Personal history of nephrotic syndrome: Secondary | ICD-10-CM

## 2017-03-19 DIAGNOSIS — R05 Cough: Secondary | ICD-10-CM | POA: Diagnosis not present

## 2017-03-19 LAB — CBC WITH DIFFERENTIAL/PLATELET
Basophils Absolute: 0 10*3/uL (ref 0.0–0.1)
Basophils Relative: 0 %
Eosinophils Absolute: 0 10*3/uL (ref 0.0–0.7)
Eosinophils Relative: 0 %
HEMATOCRIT: 36.1 % — AB (ref 39.0–52.0)
HEMOGLOBIN: 12.2 g/dL — AB (ref 13.0–17.0)
LYMPHS ABS: 0.3 10*3/uL — AB (ref 0.7–4.0)
LYMPHS PCT: 3 %
MCH: 29.8 pg (ref 26.0–34.0)
MCHC: 33.8 g/dL (ref 30.0–36.0)
MCV: 88.3 fL (ref 78.0–100.0)
MONOS PCT: 2 %
Monocytes Absolute: 0.2 10*3/uL (ref 0.1–1.0)
NEUTROS PCT: 95 %
Neutro Abs: 8.7 10*3/uL — ABNORMAL HIGH (ref 1.7–7.7)
Platelets: 184 10*3/uL (ref 150–400)
RBC: 4.09 MIL/uL — AB (ref 4.22–5.81)
RDW: 15.6 % — ABNORMAL HIGH (ref 11.5–15.5)
WBC: 9.2 10*3/uL (ref 4.0–10.5)

## 2017-03-19 LAB — COMPREHENSIVE METABOLIC PANEL
ALK PHOS: 98 U/L (ref 38–126)
ALT: 20 U/L (ref 17–63)
ANION GAP: 25 — AB (ref 5–15)
AST: 33 U/L (ref 15–41)
Albumin: 4.2 g/dL (ref 3.5–5.0)
BILIRUBIN TOTAL: 1.5 mg/dL — AB (ref 0.3–1.2)
BUN: 30 mg/dL — ABNORMAL HIGH (ref 6–20)
CALCIUM: 9.8 mg/dL (ref 8.9–10.3)
CO2: 19 mmol/L — AB (ref 22–32)
CREATININE: 5.46 mg/dL — AB (ref 0.61–1.24)
Chloride: 90 mmol/L — ABNORMAL LOW (ref 101–111)
GFR, EST AFRICAN AMERICAN: 13 mL/min — AB (ref >60)
GFR, EST NON AFRICAN AMERICAN: 11 mL/min — AB (ref >60)
Glucose, Bld: 382 mg/dL — ABNORMAL HIGH (ref 65–99)
Potassium: 4.4 mmol/L (ref 3.5–5.1)
Sodium: 134 mmol/L — ABNORMAL LOW (ref 135–145)
TOTAL PROTEIN: 9.1 g/dL — AB (ref 6.5–8.1)

## 2017-03-19 LAB — I-STAT VENOUS BLOOD GAS, ED
BICARBONATE: 23.4 mmol/L (ref 20.0–28.0)
O2 Saturation: 87 %
PCO2 VEN: 34 mmHg — AB (ref 44.0–60.0)
PH VEN: 7.445 — AB (ref 7.250–7.430)
PO2 VEN: 49 mmHg — AB (ref 32.0–45.0)
Patient temperature: 98.5
TCO2: 24 mmol/L (ref 22–32)

## 2017-03-19 LAB — CBG MONITORING, ED
Glucose-Capillary: 350 mg/dL — ABNORMAL HIGH (ref 65–99)
Glucose-Capillary: 373 mg/dL — ABNORMAL HIGH (ref 65–99)

## 2017-03-19 LAB — I-STAT TROPONIN, ED: TROPONIN I, POC: 0.01 ng/mL (ref 0.00–0.08)

## 2017-03-19 LAB — LIPASE, BLOOD: LIPASE: 39 U/L (ref 11–51)

## 2017-03-19 LAB — I-STAT CG4 LACTIC ACID, ED: Lactic Acid, Venous: 2.6 mmol/L (ref 0.5–1.9)

## 2017-03-19 LAB — BETA-HYDROXYBUTYRIC ACID: Beta-Hydroxybutyric Acid: 4.46 mmol/L — ABNORMAL HIGH (ref 0.05–0.27)

## 2017-03-19 MED ORDER — HYDROCODONE-ACETAMINOPHEN 5-325 MG PO TABS: 1.0000 | ORAL_TABLET | ORAL | Status: DC | PRN

## 2017-03-19 MED ORDER — HYDRALAZINE HCL 20 MG/ML IJ SOLN
20.0000 mg | Freq: Once | INTRAMUSCULAR | Status: AC
  Administered 2017-03-19: 20 mg via INTRAVENOUS
  Filled 2017-03-19: qty 1

## 2017-03-19 MED ORDER — METOCLOPRAMIDE HCL 5 MG/ML IJ SOLN
10.0000 mg | Freq: Once | INTRAMUSCULAR | Status: AC
  Administered 2017-03-19: 10 mg via INTRAVENOUS
  Filled 2017-03-19: qty 2

## 2017-03-19 MED ORDER — METOPROLOL SUCCINATE ER 25 MG PO TB24
25.0000 mg | ORAL_TABLET | Freq: Two times a day (BID) | ORAL | Status: DC
  Administered 2017-03-20: 25 mg via ORAL
  Filled 2017-03-19 (×3): qty 1

## 2017-03-19 MED ORDER — ONDANSETRON HCL 4 MG/2ML IJ SOLN
4.0000 mg | Freq: Once | INTRAMUSCULAR | Status: AC
  Administered 2017-03-19: 4 mg via INTRAVENOUS
  Filled 2017-03-19: qty 2

## 2017-03-19 MED ORDER — LABETALOL HCL 5 MG/ML IV SOLN
10.0000 mg | INTRAVENOUS | Status: AC | PRN
  Administered 2017-03-20 – 2017-03-22 (×2): 10 mg via INTRAVENOUS
  Filled 2017-03-19 (×2): qty 4

## 2017-03-19 MED ORDER — HYDRALAZINE HCL 25 MG PO TABS: 25.0000 mg | ORAL_TABLET | Freq: Three times a day (TID) | ORAL | Status: DC | PRN

## 2017-03-19 MED ORDER — INSULIN REGULAR HUMAN 100 UNIT/ML IJ SOLN
INTRAMUSCULAR | Status: DC
  Administered 2017-03-19: 3.1 [IU]/h via INTRAVENOUS
  Filled 2017-03-19: qty 1

## 2017-03-19 MED ORDER — PROMETHAZINE HCL 25 MG/ML IJ SOLN
12.5000 mg | Freq: Four times a day (QID) | INTRAMUSCULAR | Status: DC | PRN
  Administered 2017-03-19 – 2017-03-21 (×5): 12.5 mg via INTRAVENOUS
  Filled 2017-03-19 (×5): qty 1

## 2017-03-19 MED ORDER — DIPHENHYDRAMINE HCL 50 MG/ML IJ SOLN
25.0000 mg | Freq: Once | INTRAMUSCULAR | Status: AC
  Administered 2017-03-19: 25 mg via INTRAVENOUS
  Filled 2017-03-19: qty 1

## 2017-03-19 MED ORDER — SODIUM CHLORIDE 0.9 % IV BOLUS (SEPSIS)
250.0000 mL | Freq: Once | INTRAVENOUS | Status: AC
  Administered 2017-03-19: 250 mL via INTRAVENOUS

## 2017-03-19 MED ORDER — SODIUM CHLORIDE 0.9 % IV SOLN
INTRAVENOUS | Status: DC
  Administered 2017-03-19: 22:00:00 via INTRAVENOUS

## 2017-03-19 MED ORDER — HYDRALAZINE HCL 20 MG/ML IJ SOLN
10.0000 mg | Freq: Once | INTRAMUSCULAR | Status: AC
  Administered 2017-03-19: 10 mg via INTRAVENOUS
  Filled 2017-03-19: qty 1

## 2017-03-19 MED ORDER — DEXTROSE-NACL 5-0.45 % IV SOLN: INTRAVENOUS | Status: DC

## 2017-03-19 MED ORDER — POTASSIUM CHLORIDE 10 MEQ/100ML IV SOLN
10.0000 meq | INTRAVENOUS | Status: AC
Start: 2017-03-19 — End: 2017-03-20
  Administered 2017-03-19 – 2017-03-20 (×2): 10 meq via INTRAVENOUS
  Filled 2017-03-19 (×2): qty 100

## 2017-03-19 NOTE — ED Notes (Signed)
I Stat Lactic Acid results of 2.60 reported to [facility] [name].

## 2017-03-19 NOTE — ED Notes (Signed)
I Stat Lactic Acid results of 6.14 reported to Dr. Juleen ChinaKohut, repeated back.

## 2017-03-19 NOTE — ED Provider Notes (Signed)
I saw and evaluated the patient, reviewed the resident's note and I agree with the findings and plan.  Pertinent History: The patient has multiple medical problems including end-stage renal disease, prior history of bypass grafting, nephrotic syndrome, presents to the hospital after going through an entire dialysis session, he then developed nausea and has had nausea and vomiting the better part of the day for the last 8 hours.  He has had some Zofran prior to arrival with mild improvement but continues to have significant nausea.  He denies any pain.  Pertinent Exam findings: On exam the patient has clear heart and lung sounds, there is no wheezing, his abdomen is soft, he does have a mid sternotomy scar which is well-healed, he does have bilateral lower extremity mild edema.  His oropharynx appears to be normal.  Labs, EKG, mild hydration, control nausea.  He does have a borderline prolonged QT, will need to avoid QT prolonging agents after this dose of Zofran.  The pt will also need a search for other possible causes of his vomiting -   Labs show that the pt is   Hyperglycemic  Low Co2 at 19  AG of 25  K of 4.4  This raises suspicion of DKA - I do not want to give Russell [name] too much fluid despite vomiting today as this may throw him into pulmonary edema given underlying renal failure.  Will give insulin - drip - CC provided.  Will d/w hospitatlist for admission.  .Critical Care Performed by: Russell [name], Valeri Sula, MD Authorized by: Russell [name], Brittney Mucha, MD   Critical care provider statement:    Critical care time (minutes):  35   Critical care time was exclusive of:  Separately billable procedures and treating other patients and teaching time   Critical care was necessary to treat or prevent imminent or life-threatening deterioration of the following conditions:  Endocrine crisis   Critical care was time spent personally by me on the following activities:  Blood draw for specimens, development of  treatment plan with patient or surrogate, discussions with consultants, evaluation of patient's response to treatment, examination of patient, obtaining history from patient or surrogate, ordering and performing treatments and interventions, ordering and review of laboratory studies, ordering and review of radiographic studies, pulse oximetry, re-evaluation of patient's condition and review of old charts       EKG Interpretation  Date/Time:  Monday March 19 2017 18:56:35 EDT Ventricular Rate:  100 PR Interval:    QRS Duration: 98 QT Interval:  385 QTC Calculation: 497 R Axis:   8 Text Interpretation:  Sinus tachycardia Anteroseptal infarct, old Repol abnrm suggests ischemia, lateral leads Since last tracing rate faster Confirmed by Russell [name], Xabi Wittler ([id number]) on 03/19/2017 7:12:23 PM       I personally interpreted the EKG as well as the resident and agree with the interpretation on the resident's chart.  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

## 2017-03-19 NOTE — ED Provider Notes (Signed)
[facility] EMERGENCY DEPARTMENT Provider Note   CSN: [id number] Arrival date & time: 03/19/17  1850     History   Chief Complaint Chief Complaint  Patient presents with  . Nausea  . Emesis  . Hypertension    HPI Russell [name] is a 47 y.o. male.  The history is provided by the patient and the EMS personnel.  Emesis   This is a recurrent problem. The current episode started 6 to 12 hours ago. The problem occurs more than 10 times per day. The problem has been gradually improving. The emesis has an appearance of stomach contents. There has been no fever. Associated symptoms include chills, cough (recent PNA) and sweats. Pertinent negatives include no abdominal pain, no diarrhea, no fever, no headaches and no URI.  -Patient states that after his full dialysis session this morning around 11 AM, patient developed nausea and vomiting and has had vomiting episodes every 15 minutes since then.  He also claims of chills and sweating.  He denies any chest pain, shortness of breath, abdominal pain.  When asked about his gastroparesis, he states this feels different because he normally has pain with his gastroparesis and he does not currently have any abdominal pain.  Past Medical History:  Diagnosis Date  . Anxiety   . Asthma   . 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 Daily headache   . Depression   . DVT (deep venous thrombosis), H/o 01/2014-on Xarelto 03/24/2014   LLE  . ESRD (end stage renal disease) on dialysis [facility])    "[place], MWF" (06/29/2016)  . Gait abnormality 12/22/2016  . Gastroparesis 12/22/2016  . Hypertension   . Hypothyroidism   . Nephrotic syndrome 05/18/2014  . Neuropathy   . Type 2 diabetes mellitus with diabetic nephropathy [facility])      Patient Active Problem List   Diagnosis Date Noted  . DKA (diabetic ketoacidoses) (HCC) 03/19/2017  . CAP (community acquired pneumonia) 03/01/2017  . Anxiety 02/06/2017  . Asthma 02/06/2017  . 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 Hyperlipidemia 02/06/2017  . Macular degeneration 02/06/2017  . Neuropathy 02/06/2017  . Restrictive airway disease 02/06/2017  . 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 Quadriparesis (HCC) 12/22/2016  . Gait abnormality 12/22/2016  . Hypertensive urgency 10/13/2016  . Acute pulmonary edema (HCC)   . Anemia in chronic kidney disease, on chronic dialysis (HCC)   . Weakness   . Hyperkalemia 09/26/2016  . History of DVT of lower extremity 08/17/2016  . Arteriosclerotic vascular disease 08/17/2016  . DKA, type 1 (HCC) 06/29/2016  . Nausea vomiting and diarrhea 06/29/2016  . Malignant hypertension 06/29/2016  . Chronic respiratory failure (HCC) 05/16/2016  . Anemia due to end stage renal disease (HCC) 05/16/2016  . Pre-transplant evaluation for kidney transplant 04/25/2016  . S/P CABG (coronary artery bypass graft) 09/28/2015  . Coronary artery disease involving native coronary artery of native heart without angina pectoris   . ESRD on dialysis (HCC) 02/13/2015  . Nephrotic syndrome 01/03/2015  . Essential hypertension, benign   . Diabetic ketoacidosis without coma associated with type 2 diabetes mellitus (HCC)   . Hx of gastroesophageal reflux (GERD) 09/30/2014  . Diabetic neuropathy (HCC) 09/21/2014  . Uncontrolled diabetes mellitus type 2 with peripheral artery disease (HCC) 09/21/2014  .  Morbid obesity (HCC) 09/21/2014  . Proteinuria 07/09/2014  . Chest pain 04/22/2014  . Superficial thrombophlebitis 03/24/2014  . Hypothyroidism (acquired) 03/24/2014    Past Surgical History:  Procedure Laterality Date  . ANKLE FRACTURE SURGERY Right 1988  . AV FISTULA PLACEMENT Left 01/01/2015   Procedure: CREATION OF LEFT  RADIAL CEPHALIC ARTERIOVENOUS (AV) FISTULA ;  Surgeon: Pryor Ochoa, MD;  Location: [facility] OR;  Service: Vascular;  Laterality: Left;  . CAPD REMOVAL N/A 05/19/2016   Procedure: PD CATH REMOVAL;  Surgeon: Abigail Miyamoto, MD;  Location: [facility] OR;  Service: General;  Laterality: N/A;  . CARDIAC CATHETERIZATION N/A 09/22/2015   Procedure: Left Heart Cath and Coronary Angiography;  Surgeon: Iran Ouch, MD;  Location: [facility] INVASIVE CV LAB;  Service: Cardiovascular;  Laterality: N/A;  . CORONARY ARTERY BYPASS GRAFT N/A 09/28/2015   Procedure: CORONARY ARTERY BYPASS GRAFTING (CABG) x 3 UTILIZING LEFT MAMMARY ARTERY AND ENDOSCOPICALLY HARVESTED LEFT GREATER SAPHENOUS VEIN.;  Surgeon: Delight Ovens, MD;  Location: [facility] OR;  Service: Open Heart Surgery;  Laterality: N/A;  . ENDOVEIN HARVEST OF GREATER SAPHENOUS VEIN Left 09/28/2015   Procedure: ENDOVEIN HARVEST OF GREATER SAPHENOUS VEIN;  Surgeon: Delight Ovens, MD;  Location: [facility] OR;  Service: Open Heart Surgery;  Laterality: Left;  . ESOPHAGOGASTRODUODENOSCOPY N/A 03/29/2014   Procedure: ESOPHAGOGASTRODUODENOSCOPY (EGD);  Surgeon: Dorena Cookey, MD;  Location: [facility]COPY;  Service: Endoscopy;  Laterality: N/A;  . EYE SURGERY    . FRACTURE SURGERY    . INSERTION OF DIALYSIS CATHETER Right 01/05/2015   Procedure: INSERTION OF RIGHT INTERNAL JUGULAR DIALYSIS CATHETER;  Surgeon: Fransisco Hertz, MD;  Location: [facility] OR;  Service: Vascular;  Laterality: Right;  . LEFT HEART CATH AND CORS/GRAFTS ANGIOGRAPHY N/A 09/13/2016   Procedure: LEFT HEART CATH AND CORS/GRAFTS ANGIOGRAPHY;  Surgeon: Kathleene Hazel, MD;  Location: [facility] INVASIVE CV LAB;  Service: Cardiovascular;  Laterality: N/A;  . RETINAL LASER PROCEDURE Bilateral   . RIGHT/LEFT HEART CATH AND CORONARY/GRAFT ANGIOGRAPHY N/A 03/06/2017   Procedure: RIGHT/LEFT HEART CATH AND CORONARY/GRAFT ANGIOGRAPHY;  Surgeon: Marykay Lex, MD;  Location: [facility] INVASIVE CV LAB;  Service: Cardiovascular;  Laterality: N/A;    . TEE WITHOUT CARDIOVERSION N/A 09/28/2015   Procedure: TRANSESOPHAGEAL ECHOCARDIOGRAM (TEE);  Surgeon: Delight Ovens, MD;  Location: [facility] OR;  Service: Open Heart Surgery;  Laterality: N/A;  . TONSILLECTOMY AND ADENOIDECTOMY  1970s       Home Medications    Prior to Admission medications   Medication Sig Start Date End Date Taking? Authorizing Provider  acetaminophen (TYLENOL) 500 MG tablet Take 1 tablet (500 mg total) by mouth every 6 (six) hours as needed for moderate pain. Patient taking differently: Take 1,000 mg by mouth every 6 (six) hours as needed (for pain).  09/29/14  Yes Albertine Grates, MD  albuterol (PROVENTIL HFA;VENTOLIN HFA) 108 (90 Base) MCG/ACT inhaler Inhale 1 puff into the lungs every 6 (six) hours as needed for wheezing or shortness of breath. Patient taking differently: Inhale 2 puffs into the lungs every 8 (eight) hours as needed for wheezing or shortness of breath.  03/11/15  Yes [name], MD  albuterol (PROVENTIL) (2.5 MG/3ML) 0.083% nebulizer solution Take 3 mLs (2.5 mg total) by nebulization every 4 (four) hours as needed for wheezing. 09/20/16  Yes [name], MD  aspirin EC 81 MG EC tablet Take 1 tablet (81 mg total) by mouth daily. 10/06/15  Yes Barrett, Erin R, PA-C  atorvastatin (LIPITOR) 80 MG tablet  Take 1 tablet (80 mg total) by mouth daily at 6 PM. 01/05/17  Yes Azalee Course, PA  calcitRIOL (ROCALTROL) 0.25 MCG capsule Take 5 capsules (1.25 mcg total) by mouth every Monday, Wednesday, and Friday with hemodialysis. 03/14/17 04/13/17 Yes Briant Cedar, MD  calcium acetate (PHOSLO) 667 MG capsule Take 3 capsules (2,001 mg total) by mouth 3 (three) times daily with meals. 03/13/17 04/12/17 Yes Briant Cedar, MD  cinacalcet (SENSIPAR) 30 MG tablet Take 30 mg by mouth every Monday, Wednesday, and Friday.   Yes [provider]  citalopram (CELEXA) 40 MG tablet Take 40 mg by mouth at bedtime.    Yes [provider]  clonazePAM (KLONOPIN) 2 MG  tablet Take 2 mg by mouth as needed.    Yes [provider]  cyclobenzaprine (FLEXERIL) 10 MG tablet Take 1 tablet (10 mg total) 3 times/day as needed-between meals & bedtime by mouth for muscle spasms. 11/07/16  Yes [name], MD  fluticasone (FLONASE) 50 MCG/ACT nasal spray Place 1 spray into both nostrils daily. 03/14/17  Yes Briant Cedar, MD  gabapentin (NEURONTIN) 100 MG capsule Take 2 capsules (200 mg total) by mouth 3 (three) times daily. 03/13/17 04/12/17 Yes Briant Cedar, MD  hydrALAZINE (APRESOLINE) 25 MG tablet Take 1 tablet (25 mg total) by mouth 3 (three) times daily as needed (take for blood pressure above 160 if not controlled with home meds). 10/15/16 10/15/17 Yes Rolly Salter, MD  HYDROcodone-acetaminophen (NORCO/VICODIN) 5-325 MG tablet Take 1-2 tablets by mouth every 4 (four) hours as needed for moderate pain. 08/17/16  Yes [name], MD  HYDROmorphone (DILAUDID) 2 MG tablet Take 2 mg by mouth 3 (three) times daily.   Yes [provider]  hydrOXYzine (ATARAX/VISTARIL) 25 MG tablet Take 25 mg by mouth 2 (two) times daily as needed for anxiety.    Yes [provider]  Immune Globulin, Human, (PRIVIGEN) 40 GM/400ML SOLN Inject 40 g into the vein. 40gm/dayx 5 days and then 40gm q4 weeks   Yes [provider]  insulin glargine (LANTUS) 100 UNIT/ML injection Inject 0.3 mLs (30 Units total) into the skin at bedtime. [place] 01/05/17  Yes Carlus Pavlov, MD  insulin lispro (HUMALOG) 100 UNIT/ML injection Inject 0.15-0.3 mLs (15-30 Units total) into the skin every evening. Take one unit per 5 carbs depending on dinner. 11/29/15  Yes Carlus Pavlov, MD  levothyroxine (SYNTHROID, LEVOTHROID) 50 MCG tablet Take 1 tablet (50 mcg total) by mouth daily before breakfast. 01/05/17  Yes Carlus Pavlov, MD  liraglutide (VICTOZA) 18 MG/3ML SOPN Inject 0.3 mLs (1.8 mg total) into the skin daily before breakfast. 01/05/17  Yes Carlus Pavlov, MD    metoCLOPramide (REGLAN) 5 MG tablet Take 1 tablet (5 mg total) by mouth every 8 (eight) hours as needed for nausea or vomiting. Patient taking differently: Take 10 mg by mouth 3 (three) times daily.  01/10/16  Yes Brentley Landfair, DO  metoprolol succinate (TOPROL-XL) 25 MG 24 hr tablet Take 1 tablet (25 mg total) by mouth 2 (two) times daily. 03/13/17 04/12/17 Yes Briant Cedar, MD  ondansetron (ZOFRAN) 4 MG tablet Take 1 tablet (4 mg total) by mouth 3 (three) times daily as needed for nausea or vomiting. 03/01/17  Yes Mannam, Praveen, MD  OVER THE COUNTER MEDICATION Place 1 drop into the right eye 4 (four) times daily. OS 1015 Prednisolone /Gati/ Brom 1%-0.5 %-0.7% oph soln 3.5  [facility] [phone] [address] [place],  [place] [id number]   Yes [provider]  pantoprazole (PROTONIX) 40 MG tablet Take 1 tablet (40 mg total) by mouth 2 (two) times daily. 03/13/17 04/12/17 Yes Briant CedarEzenduka, Nkeiruka J, MD  ferric gluconate 62.5 mg in sodium chloride 0.9 % 100 mL Inject 62.5 mg into the vein every Wednesday with hemodialysis. 03/14/17   Briant CedarEzenduka, Nkeiruka J, MD    Family History Family History  Problem Relation Age of Onset  . Obesity Mother        Patient states that family members have no other medical illnesses other than what I have described  . Kidney cancer Maternal Grandmother   . Cancer Father 50       AML  . Heart disease Unknown        No family history    Social History Social History   Tobacco Use  . Smoking status: Never Smoker  . Smokeless tobacco: Never Used  Substance Use Topics  . Alcohol use: Yes    Alcohol/week: 0.0 oz    Comment: 06/29/2016 "might have a few drinks/year"  . Drug use: No     Allergies   Nsaids and Tape   Review of Systems Review of Systems  Constitutional: Positive for appetite change and chills. Negative for fever.  HENT: Negative for ear pain and sore throat.   Eyes: Negative for pain and visual disturbance.   Respiratory: Positive for cough (recent PNA). Negative for shortness of breath.   Cardiovascular: Negative for chest pain, palpitations and leg swelling.  Gastrointestinal: Positive for nausea and vomiting. Negative for abdominal distention, abdominal pain and diarrhea.  Genitourinary:       No longer makes urine  Musculoskeletal: Negative for back pain and neck pain.  Skin: Negative for rash.  Neurological: Negative for syncope and headaches.  All other systems reviewed and are negative.    Physical Exam Updated Vital Signs BP (!) 177/92   Pulse (!) 115   Temp 97.9 F (36.6 C) (Oral)   Resp (!) 25   Ht 6\' 2"  (1.88 m)   Wt 113.4 kg (250 lb)   SpO2 100%   BMI 32.10 kg/m   Physical Exam  Constitutional: He is oriented to person, place, and time. He appears well-developed and well-nourished.  Appears uncomfortable and is dry heaving, laid on his side.  HENT:  Head: Normocephalic and atraumatic.  Mouth/Throat: Oropharynx is clear and moist.  Eyes: Conjunctivae are normal.  Neck: Neck supple.  Cardiovascular: Normal rate and regular rhythm.  Pulmonary/Chest: Effort normal and breath sounds normal. No respiratory distress. He has no wheezes. He has no rales.  Abdominal: Soft. He exhibits no distension. There is no tenderness. There is no guarding.  Musculoskeletal: He exhibits no edema.  Neurological: He is alert and oriented to person, place, and time.  Skin: Skin is warm and dry.  Psychiatric: He has a normal mood and affect.  Nursing note and vitals reviewed.    ED Treatments / Results  Labs (all labs ordered are listed, but only abnormal results are displayed) Labs Reviewed  CBC WITH DIFFERENTIAL/PLATELET - Abnormal; Notable for the following components:      Result Value   RBC 4.09 (*)    Hemoglobin 12.2 (*)    HCT 36.1 (*)    RDW 15.6 (*)    Neutro Abs 8.7 (*)    Lymphs Abs 0.3 (*)    All other components within normal limits  COMPREHENSIVE METABOLIC PANEL -  Abnormal; Notable for the following components:  Sodium 134 (*)    Chloride 90 (*)    CO2 19 (*)    Glucose, Bld 382 (*)    BUN 30 (*)    Creatinine, Ser 5.46 (*)    Total Protein 9.1 (*)    Total Bilirubin 1.5 (*)    GFR calc non Af Amer 11 (*)    GFR calc Af Amer 13 (*)    Anion gap 25 (*)    All other components within normal limits  BETA-HYDROXYBUTYRIC ACID - Abnormal; Notable for the following components:   Beta-Hydroxybutyric Acid 4.46 (*)    All other components within normal limits  I-STAT CG4 LACTIC ACID, ED - Abnormal; Notable for the following components:   Lactic Acid, Venous 2.60 (*)    All other components within normal limits  CBG MONITORING, ED - Abnormal; Notable for the following components:   Glucose-Capillary 373 (*)    All other components within normal limits  I-STAT VENOUS BLOOD GAS, ED - Abnormal; Notable for the following components:   pH, Ven 7.445 (*)    pCO2, Ven 34.0 (*)    pO2, Ven 49.0 (*)    All other components within normal limits  CBG MONITORING, ED - Abnormal; Notable for the following components:   Glucose-Capillary 350 (*)    All other components within normal limits  LIPASE, BLOOD  BLOOD GAS, VENOUS  BASIC METABOLIC PANEL  BASIC METABOLIC PANEL  BASIC METABOLIC PANEL  I-STAT TROPONIN, ED  I-STAT CG4 LACTIC ACID, ED    EKG  EKG Interpretation  Date/Time:  Monday March 19 2017 18:56:35 EDT Ventricular Rate:  100 PR Interval:    QRS Duration: 98 QT Interval:  385 QTC Calculation: 497 R Axis:   8 Text Interpretation:  Sinus tachycardia Anteroseptal infarct, old Repol abnrm suggests ischemia, lateral leads Since last tracing rate faster Confirmed by Eber Hong ([id number]) on 03/19/2017 7:12:23 PM       Radiology Dg Chest 2 View  Result Date: 03/19/2017 CLINICAL DATA:  Cough, recent infiltrate EXAM: CHEST - 2 VIEW COMPARISON:  03/01/2017 FINDINGS: Cardiac shadow is enlarged but stable. Postsurgical changes are again noted.  Previously seen vascular congestion has improved in the interval. No focal infiltrate or effusion is seen. No bony abnormality is noted. IMPRESSION: No active cardiopulmonary disease. Electronically Signed   By: Alcide Clever M.D.   On: 03/19/2017 19:44    Procedures Procedures (including critical care time)  Medications Ordered in ED Medications  insulin regular (NOVOLIN R,HUMULIN R) 100 Units in sodium chloride 0.9 % 100 mL (1 Units/mL) infusion (2.5 Units/hr Intravenous Rate/Dose Change 03/19/17 2336)  0.9 %  sodium chloride infusion ( Intravenous New Bag/Given 03/19/17 2228)  dextrose 5 %-0.45 % sodium chloride infusion (not administered)  potassium chloride 10 mEq in 100 mL IVPB (10 mEq Intravenous New Bag/Given 03/19/17 2350)  hydrALAZINE (APRESOLINE) tablet 25 mg (not administered)  HYDROcodone-acetaminophen (NORCO/VICODIN) 5-325 MG per tablet 1-2 tablet (not administered)  metoprolol succinate (TOPROL-XL) 24 hr tablet 25 mg (not administered)  labetalol (NORMODYNE,TRANDATE) injection 10 mg (not administered)  promethazine (PHENERGAN) injection 12.5 mg (12.5 mg Intravenous Given 03/19/17 2355)  ondansetron (ZOFRAN) injection 4 mg (4 mg Intravenous Given 03/19/17 1916)  sodium chloride 0.9 % bolus 250 mL (0 mLs Intravenous Stopped 03/19/17 2336)  metoCLOPramide (REGLAN) injection 10 mg (10 mg Intravenous Given 03/19/17 2050)  diphenhydrAMINE (BENADRYL) injection 25 mg (25 mg Intravenous Given 03/19/17 2049)  hydrALAZINE (APRESOLINE) injection 10 mg (10 mg Intravenous Given 03/19/17 2056)  hydrALAZINE (APRESOLINE) injection 20 mg (20 mg Intravenous Given 03/19/17 2350)     Initial Impression / Assessment and Plan / ED Course  I have reviewed the triage vital signs and the nursing notes.  Pertinent labs & imaging results that were available during my care of the patient were reviewed by me and considered in my medical decision making (see chart for details).     Patient is a 47 year old  male who presents with 6 hours of continued vomiting.  His vomiting started after his dialysis session today.  He states he has been dry heaving every 15 minutes.  He received a dose of Zofran with EMS which mildly improve the symptoms.  On exam he appears uncomfortable and is lying on his side actively dry heaving.  Abdominal exam is benign.  He states this feels different than his gastroparesis flares as he does not have any abdominal pain with this.  He is also noted to be hypertensive in the 190s systolic.  Zofran and Reglan given here in sequential fashion for nausea control which did help.  Small IV fluid bolus given.  Labs were obtained as above significant for glucose of 382, bicarb of 19, anion gap 25.  Beta hydroxybutyrate casted abdomen which is 4.4.  He also has a mild lactic acidosis of 2.6.  Interestingly, venous blood gas showed 7.44.  Given he has anion gap and is hyperglycemic to almost 400, patient placed on insulin drip and will treat as a atypical DKA.  Patient placed on maintenance fluids.  Patient will be admitted for treatment of his intractable vomiting and DKA.  Final Clinical Impressions(s) / ED Diagnoses   Final diagnoses:  Diabetic ketoacidosis without coma associated with type 1 diabetes mellitus (HCC)  Persistent vomiting    ED Discharge Orders    None       Dwana Melena, DO 03/20/17 0015    Eber Hong, MD 03/21/17 1039

## 2017-03-19 NOTE — H&P (Signed)
History and Physical    [name]:[id number] DOB: [birth year] DOA: 03/19/2017  PCP: [name], MD   Patient coming from: Home   Chief Complaint: Vomiting  HPI: Russell [name] is a 47 y.o. male with medical history significant for ESRD, DM, hypothyroidism, obesity, CAD- s/p CABG, restrictive airway disease.  Patient presented to the ED with complaints of vomiting that started this morning after he completed the session of dialysis.  Pt reports multiple episodes of nonbilious nonbloody vomiting.  No abdominal pain or swelling, no loose or dark stools.  Pt denies shortness of breath or cough, fever or chills, no chest pains.  Denies ulcers or wounds. Patient does not make urine.  Patient reports compliance with his Lantus 30u BID and sliding scale NovoLog.  ED Course: Tachycardia-up to 111, tachypnea.  Elevated blood pressure 194/96. BMP-elevated anion gap of 25, low bicarb 19, CBG 382.  Elevated beta hydroxybutyric acid 4.46.  Normal lipase 39 x-ray negative for acute abnormality.  Patient was started on insulin drip in ED per DKA protocol.  Patient was given 250 ml bolus, started on IV fluid normal saline at 100 cc/h.  Review of Systems: As per HPI otherwise 10 point review of systems negative.  Past Medical History:  Diagnosis Date  . Anxiety   . Asthma   . 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 Daily headache   . Depression   . DVT (deep venous thrombosis), H/o 01/2014-on Xarelto 03/24/2014   LLE  . ESRD (end stage renal disease) on dialysis [facility])    "[place], MWF" (06/29/2016)  . Gait abnormality 12/22/2016  . Gastroparesis 12/22/2016  . Hypertension   . Hypothyroidism   . Nephrotic syndrome 05/18/2014  . Neuropathy   . Type 2 diabetes mellitus with  diabetic nephropathy [facility])     Past Surgical History:  Procedure Laterality Date  . ANKLE FRACTURE SURGERY Right 1988  . AV FISTULA PLACEMENT Left 01/01/2015   Procedure: CREATION OF LEFT RADIAL CEPHALIC ARTERIOVENOUS (AV) FISTULA ;  Surgeon: Pryor Ochoa, MD;  Location: [facility] OR;  Service: Vascular;  Laterality: Left;  . CAPD REMOVAL N/A 05/19/2016   Procedure: PD CATH REMOVAL;  Surgeon: Abigail Miyamoto, MD;  Location: [facility] OR;  Service: General;  Laterality: N/A;  . CARDIAC CATHETERIZATION N/A 09/22/2015   Procedure: Left Heart Cath and Coronary Angiography;  Surgeon: Iran Ouch, MD;  Location: [facility] INVASIVE CV LAB;  Service: Cardiovascular;  Laterality: N/A;  . CORONARY ARTERY BYPASS GRAFT N/A 09/28/2015   Procedure: CORONARY ARTERY BYPASS GRAFTING (CABG) x 3 UTILIZING LEFT MAMMARY ARTERY AND ENDOSCOPICALLY HARVESTED LEFT GREATER SAPHENOUS VEIN.;  Surgeon: Delight Ovens, MD;  Location: [facility] OR;  Service: Open Heart Surgery;  Laterality: N/A;  . ENDOVEIN HARVEST OF GREATER SAPHENOUS VEIN Left 09/28/2015   Procedure: ENDOVEIN HARVEST OF GREATER SAPHENOUS VEIN;  Surgeon: Delight Ovens, MD;  Location[facility] OR;  Service: Open Heart Surgery;  Laterality: Left;  . ESOPHAGOGASTRODUODENOSCOPY N/A 03/29/2014   Procedure: ESOPHAGOGASTRODUODENOSCOPY (EGD);  Surgeon: Dorena Cookey, MD;  Location: [facility] ENDOSCOPY;  Service: Endoscopy;  Laterality: N/A;  . EYE SURGERY    . FRACTURE SURGERY    . INSERTION OF DIALYSIS CATHETER Right 01/05/2015   Procedure: INSERTION OF RIGHT INTERNAL JUGULAR DIALYSIS CATHETER;  Surgeon: Ottie Glazier  Imogene Burn, MD;  Location: [facility] OR;  Service: Vascular;  Laterality: Right;  . LEFT HEART CATH AND CORS/GRAFTS ANGIOGRAPHY N/A 09/13/2016   Procedure: LEFT HEART CATH AND CORS/GRAFTS ANGIOGRAPHY;  Surgeon: Kathleene Hazel, MD;  Location: [facility] INVASIVE CV LAB;  Service: Cardiovascular;  Laterality: N/A;  . RETINAL LASER PROCEDURE Bilateral   . RIGHT/LEFT HEART CATH AND CORONARY/GRAFT  ANGIOGRAPHY N/A 03/06/2017   Procedure: RIGHT/LEFT HEART CATH AND CORONARY/GRAFT ANGIOGRAPHY;  Surgeon: Marykay Lex, MD;  Location[facility] INVASIVE CV LAB;  Service: Cardiovascular;  Laterality: N/A;  . TEE WITHOUT CARDIOVERSION N/A 09/28/2015   Procedure: TRANSESOPHAGEAL ECHOCARDIOGRAM (TEE);  Surgeon: Delight Ovens, MD;  Location: [facility] OR;  Service: Open Heart Surgery;  Laterality: N/A;  . TONSILLECTOMY AND ADENOIDECTOMY  1970s     reports that  has never smoked. he has never used smokeless tobacco. He reports that he drinks alcohol. He reports that he does not use drugs.  Allergies  Allergen Reactions  . Nsaids Other (See Comments)    Told to avoid all nsaids due to kidney disease   . Tape Other (See Comments)    Welts result, if left for a long amount of time    Family History  Problem Relation Age of Onset  . Obesity Mother        Patient states that family members have no other medical illnesses other than what I have described  . Kidney cancer Maternal Grandmother   . Cancer Father 50       AML  . Heart disease Unknown        No family history    Prior to Admission medications   Medication Sig Start Date End Date Taking? Authorizing Provider  acetaminophen (TYLENOL) 500 MG tablet Take 1 tablet (500 mg total) by mouth every 6 (six) hours as needed for moderate pain. Patient taking differently: Take 1,000 mg by mouth every 6 (six) hours as needed (for pain).  09/29/14  Yes Albertine Grates, MD  albuterol (PROVENTIL HFA;VENTOLIN HFA) 108 (90 Base) MCG/ACT inhaler Inhale 1 puff into the lungs every 6 (six) hours as needed for wheezing or shortness of breath. Patient taking differently: Inhale 2 puffs into the lungs every 8 (eight) hours as needed for wheezing or shortness of breath.  03/11/15  Yes [name], MD  albuterol (PROVENTIL) (2.5 MG/3ML) 0.083% nebulizer solution Take 3 mLs (2.5 mg total) by nebulization every 4 (four) hours as needed for wheezing. 09/20/16  Yes [name],  MD  aspirin EC 81 MG EC tablet Take 1 tablet (81 mg total) by mouth daily. 10/06/15  Yes Barrett, Erin R, PA-C  atorvastatin (LIPITOR) 80 MG tablet Take 1 tablet (80 mg total) by mouth daily at 6 PM. 01/05/17  Yes Azalee Course, PA  calcitRIOL (ROCALTROL) 0.25 MCG capsule Take 5 capsules (1.25 mcg total) by mouth every Monday, Wednesday, and Friday with hemodialysis. 03/14/17 04/13/17 Yes Briant Cedar, MD  calcium acetate (PHOSLO) 667 MG capsule Take 3 capsules (2,001 mg total) by mouth 3 (three) times daily with meals. 03/13/17 04/12/17 Yes Briant Cedar, MD  cinacalcet (SENSIPAR) 30 MG tablet Take 30 mg by mouth every Monday, Wednesday, and Friday.   Yes [provider]  citalopram (CELEXA) 40 MG tablet Take 40 mg by mouth at bedtime.    Yes [provider]  clonazePAM (KLONOPIN) 2 MG tablet Take 2 mg by mouth as needed.    Yes [provider]  cyclobenzaprine (FLEXERIL) 10 MG tablet  Take 1 tablet (10 mg total) 3 times/day as needed-between meals & bedtime by mouth for muscle spasms. 11/07/16  Yes [name], MD  fluticasone (FLONASE) 50 MCG/ACT nasal spray Place 1 spray into both nostrils daily. 03/14/17  Yes Briant Cedar, MD  gabapentin (NEURONTIN) 100 MG capsule Take 2 capsules (200 mg total) by mouth 3 (three) times daily. 03/13/17 04/12/17 Yes Briant Cedar, MD  hydrALAZINE (APRESOLINE) 25 MG tablet Take 1 tablet (25 mg total) by mouth 3 (three) times daily as needed (take for blood pressure above 160 if not controlled with home meds). 10/15/16 10/15/17 Yes Rolly Salter, MD  HYDROcodone-acetaminophen (NORCO/VICODIN) 5-325 MG tablet Take 1-2 tablets by mouth every 4 (four) hours as needed for moderate pain. 08/17/16  Yes [name], MD  HYDROmorphone (DILAUDID) 2 MG tablet Take 2 mg by mouth 3 (three) times daily.   Yes [provider]  hydrOXYzine (ATARAX/VISTARIL) 25 MG tablet Take 25 mg by mouth 2 (two) times daily as needed for anxiety.     Yes [provider]  Immune Globulin, Human, (PRIVIGEN) 40 GM/400ML SOLN Inject 40 g into the vein. 40gm/dayx 5 days and then 40gm q4 weeks   Yes [provider]  insulin glargine (LANTUS) 100 UNIT/ML injection Inject 0.3 mLs (30 Units total) into the skin at bedtime. [place] 01/05/17  Yes Carlus Pavlov, MD  insulin lispro (HUMALOG) 100 UNIT/ML injection Inject 0.15-0.3 mLs (15-30 Units total) into the skin every evening. Take one unit per 5 carbs depending on dinner. 11/29/15  Yes Carlus Pavlov, MD  levothyroxine (SYNTHROID, LEVOTHROID) 50 MCG tablet Take 1 tablet (50 mcg total) by mouth daily before breakfast. 01/05/17  Yes Carlus Pavlov, MD  liraglutide (VICTOZA) 18 MG/3ML SOPN Inject 0.3 mLs (1.8 mg total) into the skin daily before breakfast. 01/05/17  Yes Carlus Pavlov, MD  metoCLOPramide (REGLAN) 5 MG tablet Take 1 tablet (5 mg total) by mouth every 8 (eight) hours as needed for nausea or vomiting. Patient taking differently: Take 10 mg by mouth 3 (three) times daily.  01/10/16  Yes Dong, Robin, DO  metoprolol succinate (TOPROL-XL) 25 MG 24 hr tablet Take 1 tablet (25 mg total) by mouth 2 (two) times daily. 03/13/17 04/12/17 Yes Briant Cedar, MD  ondansetron (ZOFRAN) 4 MG tablet Take 1 tablet (4 mg total) by mouth 3 (three) times daily as needed for nausea or vomiting. 03/01/17  Yes Mannam, Praveen, MD  OVER THE COUNTER MEDICATION Place 1 drop into the right eye 4 (four) times daily. OS 1015 Prednisolone /Gati/ Brom 1%-0.5 %-0.7% oph soln 3.5  [facility] [phone] [address] [place], [place] [address]   Yes [provider]  pantoprazole (PROTONIX) 40 MG tablet Take 1 tablet (40 mg total) by mouth 2 (two) times daily. 03/13/17 04/12/17 Yes Briant Cedar, MD  ferric gluconate 62.5 mg in sodium chloride 0.9 % 100 mL Inject 62.5 mg into the vein every Wednesday with hemodialysis. 03/14/17   Briant Cedar, MD    Physical  Exam: Vitals:   03/19/17 2130 03/19/17 2145 03/19/17 2200 03/19/17 2215  BP: (!) 163/76 (!) 168/72 (!) 161/74 (!) 150/75  Pulse: (!) 110 (!) 113 (!) 111 (!) 111  Resp: (!) 25 (!) 21 18 17   Temp:      TempSrc:      SpO2: 97% 98% 99% 99%  Weight:      Height:        Constitutional: NAD, calm, comfortable  Vitals:   03/19/17 2130 03/19/17 2145 03/19/17 2200 03/19/17 2215  BP: (!) 163/76 (!) 168/72 (!) 161/74 (!) 150/75  Pulse: (!) 110 (!) 113 (!) 111 (!) 111  Resp: (!) 25 (!) 21 18 17   Temp:      TempSrc:      SpO2: 97% 98% 99% 99%  Weight:      Height:       Eyes: Right eye fixed and dilated, with subconjunctival hemorrhage lateral side of right eye-patient reports from recent surgery 1 week ago for "bleeding"- likely retinal hemorrhage. lids  normal ENMT: Mucous membranes are moist. Posterior pharynx clear of any exudate or lesions.Normal dentition.  Neck: normal, supple, no masses, no thyromegaly Respiratory: clear to auscultation bilaterally, no wheezing, no crackles. Normal respiratory effort. No accessory muscle use.  Cardiovascular: Tachycardia, regular rate and rhythm, no murmurs / rubs / gallops. No extremity edema. 2+ pedal pulses. No carotid bruits.  Abdomen: no tenderness, no masses palpated. No hepatosplenomegaly. Bowel sounds positive.  Musculoskeletal: no clubbing / cyanosis. No joint deformity upper and lower extremities. Good ROM, no contractures. Normal muscle tone.  Skin: no rashes, lesions, ulcers. No induration Neurologic: CN 2-12 grossly intact. Sensation intact,Strength 5/5 in all 4.  Psychiatric: Normal judgment and insight. Alert and oriented x 3. Normal mood.   Labs on Admission: I have personally reviewed following labs and imaging studies  CBC: Recent Labs  Lab 03/13/17 0803 03/19/17 1926  WBC 6.7 9.2  NEUTROABS  --  8.7*  HGB 10.7* 12.2*  HCT 33.3* 36.1*  MCV 92.0 88.3  PLT 214 184   Basic Metabolic Panel: Recent Labs  Lab 03/13/17 0803  03/19/17 1926  NA 132* 134*  K 4.5 4.4  CL 94* 90*  CO2 25 19*  GLUCOSE 130* 382*  BUN 50* 30*  CREATININE 7.26* 5.46*  CALCIUM 9.4 9.8  PHOS 4.8*  --    Liver Function Tests: Recent Labs  Lab 03/13/17 0803 03/19/17 1926  AST  --  33  ALT  --  20  ALKPHOS  --  98  BILITOT  --  1.5*  PROT  --  9.1*  ALBUMIN 3.2* 4.2   Recent Labs  Lab 03/19/17 1926  LIPASE 39   CBG: Recent Labs  Lab 03/13/17 1155 03/19/17 2150  GLUCAP 164* 373*    Radiological Exams on Admission: Dg Chest 2 View  Result Date: 03/19/2017 CLINICAL DATA:  Cough, recent infiltrate EXAM: CHEST - 2 VIEW COMPARISON:  03/01/2017 FINDINGS: Cardiac shadow is enlarged but stable. Postsurgical changes are again noted. Previously seen vascular congestion has improved in the interval. No focal infiltrate or effusion is seen. No bony abnormality is noted. IMPRESSION: No active cardiopulmonary disease. Electronically Signed   By: Alcide CleverMark  Lukens M.D.   On: 03/19/2017 19:44    EKG: Independently reviewed.  Sinus tachycardia.  Assessment/Plan Active Problems:   Hypothyroidism (acquired)   Uncontrolled diabetes mellitus type 2 with peripheral artery disease (HCC)   Diabetic ketoacidosis without coma associated with type 2 diabetes mellitus (HCC)   Essential hypertension, benign   Asthma   DKA- with vomiting. AG- 25, Bicarb- 19. Glucose- 382. Reports compliance with insulins.  No symptoms to suggest infection at this time.  Not making urine. Tachycardia and tachypnea with lactic acidosis(likely from dehydration) meeting SIRS criteria.  Tachycardia likely from-hypovolemia, tachypnea likely from metabolic acidosis.  -Continue insulin infusion following DKA protocol started in ED -BMP every 4 hourly -Gentle hydration - NS 100cc X 10 hrs,  for Hypovolemia-lactic acidosis and vomiting.  -Trend lactic acid  Intractable vomiting-likely related to DKA or gastroparesis.  QTC 497 -Phenergan IV PRN 12.5 mg  HTN-elevated.  Systolic 190s.  IV hydralazine 10 and then 20 mg given in ED. -Resume home medications hydralazine and metoprolol -PRN IV labetalol for BP greater than 180/110  ESRD status- HD- MWF. -Last HD session completed today -Nephrology consult if not discharged by Wednesday  DVT prophylaxis: heparin Code Status: Full Family Communication: Mother at bedside Disposition Plan: 1-2 days Consults called: None Admission status: inpt, step down    Onnie Boer MD [facility] Pager [phone] From 6PM-2AM.  Otherwise please contact night-coverage [email or web address] Password [facility]  03/19/2017, 11:23 PM

## 2017-03-19 NOTE — ED Notes (Signed)
Pt care assumed, obtained verbal report.  Pt continues to dry heave after zofran was administered.  Pt is A&Ox 4.

## 2017-03-19 NOTE — ED Triage Notes (Signed)
Patient is a dialysis patent Mon, Wed, Friday.  Got full session of dialysis today, after getting back home began to vomit and feel nauseated.  Received 4mg  zofran by EMS.  CGB 432 for EMS.  No chest pain, shortness of breath, or chills.  A&Ox4, HTN noted 194/96

## 2017-03-19 NOTE — ED Notes (Signed)
Family at bedside. 

## 2017-03-19 NOTE — ED Notes (Signed)
Patient states he does not make any urine

## 2017-03-20 ENCOUNTER — Inpatient Hospital Stay (HOSPITAL_COMMUNITY): Payer: Medicare Other

## 2017-03-20 DIAGNOSIS — Z992 Dependence on renal dialysis: Secondary | ICD-10-CM

## 2017-03-20 DIAGNOSIS — E1110 Type 2 diabetes mellitus with ketoacidosis without coma: Principal | ICD-10-CM

## 2017-03-20 DIAGNOSIS — N186 End stage renal disease: Secondary | ICD-10-CM

## 2017-03-20 DIAGNOSIS — E1143 Type 2 diabetes mellitus with diabetic autonomic (poly)neuropathy: Secondary | ICD-10-CM

## 2017-03-20 DIAGNOSIS — I10 Essential (primary) hypertension: Secondary | ICD-10-CM

## 2017-03-20 DIAGNOSIS — K3184 Gastroparesis: Secondary | ICD-10-CM

## 2017-03-20 DIAGNOSIS — E118 Type 2 diabetes mellitus with unspecified complications: Secondary | ICD-10-CM

## 2017-03-20 DIAGNOSIS — E1165 Type 2 diabetes mellitus with hyperglycemia: Secondary | ICD-10-CM

## 2017-03-20 DIAGNOSIS — E7841 Elevated Lipoprotein(a): Secondary | ICD-10-CM

## 2017-03-20 LAB — BASIC METABOLIC PANEL
ANION GAP: 17 — AB (ref 5–15)
Anion gap: 17 — ABNORMAL HIGH (ref 5–15)
BUN: 35 mg/dL — AB (ref 6–20)
BUN: 35 mg/dL — ABNORMAL HIGH (ref 6–20)
CALCIUM: 9.9 mg/dL (ref 8.9–10.3)
CHLORIDE: 96 mmol/L — AB (ref 101–111)
CHLORIDE: 95 mmol/L — AB (ref 101–111)
CO2: 23 mmol/L (ref 22–32)
CO2: 28 mmol/L (ref 22–32)
CREATININE: 5.8 mg/dL — AB (ref 0.61–1.24)
Calcium: 9.4 mg/dL (ref 8.9–10.3)
Creatinine, Ser: 6.17 mg/dL — ABNORMAL HIGH (ref 0.61–1.24)
GFR calc Af Amer: 12 mL/min — ABNORMAL LOW (ref >60)
GFR calc Af Amer: 11 mL/min — ABNORMAL LOW (ref >60)
GFR calc non Af Amer: 11 mL/min — ABNORMAL LOW (ref >60)
GFR calc non Af Amer: 10 mL/min — ABNORMAL LOW (ref >60)
GLUCOSE: 278 mg/dL — AB (ref 65–99)
Glucose, Bld: 159 mg/dL — ABNORMAL HIGH (ref 65–99)
Potassium: 4.5 mmol/L (ref 3.5–5.1)
Potassium: 4.2 mmol/L (ref 3.5–5.1)
Sodium: 136 mmol/L (ref 135–145)
Sodium: 140 mmol/L (ref 135–145)

## 2017-03-20 LAB — GLUCOSE, CAPILLARY
GLUCOSE-CAPILLARY: 139 mg/dL — AB (ref 65–99)
GLUCOSE-CAPILLARY: 177 mg/dL — AB (ref 65–99)
GLUCOSE-CAPILLARY: 164 mg/dL — AB (ref 65–99)
GLUCOSE-CAPILLARY: 138 mg/dL — AB (ref 65–99)
GLUCOSE-CAPILLARY: 125 mg/dL — AB (ref 65–99)
Glucose-Capillary: 163 mg/dL — ABNORMAL HIGH (ref 65–99)

## 2017-03-20 LAB — CBG MONITORING, ED
GLUCOSE-CAPILLARY: 173 mg/dL — AB (ref 65–99)
GLUCOSE-CAPILLARY: 147 mg/dL — AB (ref 65–99)
GLUCOSE-CAPILLARY: 144 mg/dL — AB (ref 65–99)
Glucose-Capillary: 154 mg/dL — ABNORMAL HIGH (ref 65–99)
Glucose-Capillary: 155 mg/dL — ABNORMAL HIGH (ref 65–99)
Glucose-Capillary: 171 mg/dL — ABNORMAL HIGH (ref 65–99)
Glucose-Capillary: 184 mg/dL — ABNORMAL HIGH (ref 65–99)
Glucose-Capillary: 223 mg/dL — ABNORMAL HIGH (ref 65–99)
Glucose-Capillary: 304 mg/dL — ABNORMAL HIGH (ref 65–99)
Glucose-Capillary: 95 mg/dL (ref 65–99)

## 2017-03-20 LAB — I-STAT CG4 LACTIC ACID, ED: LACTIC ACID, VENOUS: 2.04 mmol/L — AB (ref 0.5–1.9)

## 2017-03-20 MED ORDER — METOCLOPRAMIDE HCL 5 MG/ML IJ SOLN
10.0000 mg | Freq: Three times a day (TID) | INTRAMUSCULAR | Status: DC
  Administered 2017-03-20 – 2017-03-22 (×6): 10 mg via INTRAVENOUS
  Filled 2017-03-20 (×6): qty 2

## 2017-03-20 MED ORDER — FLUTICASONE PROPIONATE 50 MCG/ACT NA SUSP
1.0000 | Freq: Every day | NASAL | Status: DC
  Filled 2017-03-20: qty 16

## 2017-03-20 MED ORDER — CINACALCET HCL 30 MG PO TABS
30.0000 mg | ORAL_TABLET | ORAL | Status: DC
  Filled 2017-03-20: qty 1

## 2017-03-20 MED ORDER — ASPIRIN EC 81 MG PO TBEC
81.0000 mg | DELAYED_RELEASE_TABLET | Freq: Every day | ORAL | Status: DC
  Administered 2017-03-22: 81 mg via ORAL
  Filled 2017-03-20 (×2): qty 1

## 2017-03-20 MED ORDER — INSULIN GLARGINE 100 UNIT/ML ~~LOC~~ SOLN
15.0000 [IU] | Freq: Every day | SUBCUTANEOUS | Status: DC
  Administered 2017-03-20 – 2017-03-22 (×2): 15 [IU] via SUBCUTANEOUS
  Filled 2017-03-20 (×3): qty 0.15

## 2017-03-20 MED ORDER — ALBUTEROL SULFATE (2.5 MG/3ML) 0.083% IN NEBU: 2.5000 mg | INHALATION_SOLUTION | Freq: Four times a day (QID) | RESPIRATORY_TRACT | Status: DC | PRN

## 2017-03-20 MED ORDER — DEXTROSE-NACL 5-0.45 % IV SOLN
INTRAVENOUS | Status: DC
  Administered 2017-03-20: 03:00:00 via INTRAVENOUS

## 2017-03-20 MED ORDER — SODIUM CHLORIDE 0.9 % IV SOLN
INTRAVENOUS | Status: AC
  Administered 2017-03-20: 03:00:00 via INTRAVENOUS

## 2017-03-20 MED ORDER — CALCITRIOL 0.25 MCG PO CAPS
1.2500 ug | ORAL_CAPSULE | ORAL | Status: DC
  Administered 2017-03-22: 1.25 ug via ORAL

## 2017-03-20 MED ORDER — METOPROLOL SUCCINATE ER 25 MG PO TB24
37.5000 mg | ORAL_TABLET | Freq: Two times a day (BID) | ORAL | Status: DC
  Administered 2017-03-20 – 2017-03-22 (×2): 37.5 mg via ORAL
  Filled 2017-03-20 (×2): qty 2

## 2017-03-20 MED ORDER — HYDRALAZINE HCL 25 MG PO TABS
25.0000 mg | ORAL_TABLET | Freq: Three times a day (TID) | ORAL | Status: DC
  Administered 2017-03-20 – 2017-03-22 (×4): 25 mg via ORAL
  Filled 2017-03-20 (×4): qty 1

## 2017-03-20 MED ORDER — PANTOPRAZOLE SODIUM 40 MG PO TBEC
40.0000 mg | DELAYED_RELEASE_TABLET | Freq: Two times a day (BID) | ORAL | Status: DC
  Administered 2017-03-20 – 2017-03-22 (×4): 40 mg via ORAL
  Filled 2017-03-20 (×5): qty 1

## 2017-03-20 MED ORDER — ALBUTEROL SULFATE HFA 108 (90 BASE) MCG/ACT IN AERS: 1.0000 | INHALATION_SPRAY | Freq: Four times a day (QID) | RESPIRATORY_TRACT | Status: DC | PRN

## 2017-03-20 MED ORDER — CITALOPRAM HYDROBROMIDE 40 MG PO TABS
40.0000 mg | ORAL_TABLET | Freq: Every day | ORAL | Status: DC
  Administered 2017-03-20 – 2017-03-21 (×2): 40 mg via ORAL
  Filled 2017-03-20 (×2): qty 1

## 2017-03-20 MED ORDER — GABAPENTIN 100 MG PO CAPS
200.0000 mg | ORAL_CAPSULE | Freq: Three times a day (TID) | ORAL | Status: DC
  Administered 2017-03-20 – 2017-03-22 (×5): 200 mg via ORAL
  Filled 2017-03-20 (×7): qty 2

## 2017-03-20 MED ORDER — LEVOTHYROXINE SODIUM 50 MCG PO TABS
50.0000 ug | ORAL_TABLET | Freq: Every day | ORAL | Status: DC
  Filled 2017-03-20 (×4): qty 1

## 2017-03-20 MED ORDER — ONDANSETRON HCL 4 MG/2ML IJ SOLN
4.0000 mg | Freq: Four times a day (QID) | INTRAMUSCULAR | Status: DC | PRN
  Administered 2017-03-20 – 2017-03-21 (×4): 4 mg via INTRAVENOUS
  Filled 2017-03-20 (×3): qty 2

## 2017-03-20 MED ORDER — ATORVASTATIN CALCIUM 80 MG PO TABS
80.0000 mg | ORAL_TABLET | Freq: Every day | ORAL | Status: DC
  Administered 2017-03-20 – 2017-03-21 (×2): 80 mg via ORAL
  Filled 2017-03-20 (×3): qty 1

## 2017-03-20 MED ORDER — INSULIN ASPART 100 UNIT/ML ~~LOC~~ SOLN
0.0000 [IU] | SUBCUTANEOUS | Status: DC
  Administered 2017-03-20 – 2017-03-21 (×2): 3 [IU] via SUBCUTANEOUS
  Administered 2017-03-21: 4 [IU] via SUBCUTANEOUS
  Administered 2017-03-21: 3 [IU] via SUBCUTANEOUS
  Administered 2017-03-21 – 2017-03-22 (×4): 4 [IU] via SUBCUTANEOUS
  Administered 2017-03-22: 3 [IU] via SUBCUTANEOUS

## 2017-03-20 MED ORDER — SODIUM CHLORIDE 0.9 % IV SOLN
12.5000 mg | Freq: Once | INTRAVENOUS | Status: AC
  Administered 2017-03-20: 12.5 mg via INTRAVENOUS
  Filled 2017-03-20: qty 0.5

## 2017-03-20 MED ORDER — METOCLOPRAMIDE HCL 5 MG/ML IJ SOLN
10.0000 mg | Freq: Three times a day (TID) | INTRAMUSCULAR | Status: DC
Start: 2017-03-20 — End: 2017-03-20
  Administered 2017-03-20: 10 mg via INTRAVENOUS
  Filled 2017-03-20 (×2): qty 2

## 2017-03-20 MED ORDER — CALCIUM ACETATE (PHOS BINDER) 667 MG PO CAPS
2001.0000 mg | ORAL_CAPSULE | Freq: Three times a day (TID) | ORAL | Status: DC
  Filled 2017-03-20 (×7): qty 3

## 2017-03-20 MED ORDER — ONDANSETRON HCL 4 MG/2ML IJ SOLN
INTRAMUSCULAR | Status: AC
  Filled 2017-03-20: qty 2

## 2017-03-20 MED ORDER — HEPARIN SODIUM (PORCINE) 5000 UNIT/ML IJ SOLN
5000.0000 [IU] | Freq: Three times a day (TID) | INTRAMUSCULAR | Status: DC
  Filled 2017-03-20 (×2): qty 1

## 2017-03-20 NOTE — Progress Notes (Addendum)
Inpatient Diabetes Program Recommendations  AACE/ADA: New Consensus Statement on Inpatient Glycemic Control (2015)  Target Ranges:  Prepandial:   less than 140 mg/dL      Peak postprandial:   less than 180 mg/dL (1-2 hours)      Critically ill patients:  140 - 180 mg/dL   Review of Glycemic Control  Diabetes history: DM 2 Outpatient Diabetes medications: Victoza 0.3 mg Daily, Lantus 30, Humalog 15-30 units tid (1:5 carb ratio) Current orders for Inpatient glycemic control: IV Insulin gtt  Inpatient Diabetes Program Recommendations:    CO2 28, Anion Gap 10, Glucose 100's. At time of transition, please consider Lantus 22-25 units, Novolog Sensitive Correction 0-9 units tid + Novolog HS scale + Novolog 3 units tid meal coverage if eating at least 50% of meals.  Thanks,  Christena DeemShannon Kamaile Zachow RN, MSN, BC-ADM, [facility] Inpatient Diabetes Coordinator Team Pager [phone] (8a-5p)

## 2017-03-20 NOTE — Consult Note (Signed)
   [facility]   03/20/2017  Fonnie BirkenheadSteven B Hascall April 24, 1970 [id number]   Patient is currently active with [facility] Care Management for chronic disease management services.  Patient [facility].   Patient has not be able to afford Lantus not in his Part D.  [facility].  Patient needs to see his endocrinologist as well.  Went by to see patient and he was with the nursing staff in the room.  Will follow at a more appropriate time.  Our community based plan of care has focused on disease management and community resource support.  Patient will receive a post discharge transition of care call and will be evaluated for monthly home visits for assessments and disease process education.   Inpatient Case Manager not currently available will let know that [facility] following. Of note, [facility] services does not replace or interfere with any services that are needed or arranged by inpatient case management or social work.  For additional questions or referrals please contact:  Charlesetta ShanksVictoria Deondrea Markos, RN BSN CCM [facility] Liaison  [phone] business mobile phone Toll free office [phone]

## 2017-03-20 NOTE — ED Notes (Signed)
Insulin rate change verified with Lewie LoronEmilie, RN

## 2017-03-20 NOTE — Progress Notes (Addendum)
PROGRESS NOTE    Russell [name]  ZOX:[id number] DOB: Nov 09, 1970 DOA: 03/19/2017 PCP: Sherren MochaShaw, Eva N, MD   Brief Narrative:  47 y.o. WM PMHx ESRD on HD M/W/F , DM Type 2 uncontrolled with complications, Gastroparesis, Hypothyroidism, Obesity, CAD- s/p CABG, restrictive airway disease.   Anxiety, Depression, CIDP (chronic inflammatory demyelinating polyneuropathy),  (LLE) DVT 01/2014-on Xarelto, HTN,   Presented to the ED with complaints of vomiting that started this morning after he completed the session of dialysis.  Pt reports multiple episodes of nonbilious nonbloody vomiting.  No abdominal pain or swelling, no loose or dark stools.  Pt denies shortness of breath or cough, fever or chills, no chest pains.  Denies ulcers or wounds. Patient does not make urine.  Patient reports compliance with his Lantus 30u BID and sliding scale NovoLog.   ED Course: Tachycardia-up to 111, tachypnea.  Elevated blood pressure 194/96. BMP-elevated anion gap of 25, low bicarb 19, CBG 382.  Elevated beta hydroxybutyric acid 4.46.  Normal lipase 39 x-ray negative for acute abnormality.  Patient was started on insulin drip in ED per DKA protocol.  Patient was given 250 ml bolus, started on IV fluid normal saline at 100 cc/h.    Subjective: 3/19 A/O 4, negative CP, negative SOB, negative abdominal pain, negative N/V. States diabetes was further out of control.    Assessment & Plan:   Active Problems:   Hypothyroidism (acquired)   Uncontrolled diabetes mellitus type 2 with peripheral artery disease (HCC)   Diabetic ketoacidosis without coma associated with type 2 diabetes mellitus (HCC)   Essential hypertension, benign   Asthma  Diabetes Type 2 uncontrolled with complication/DKA -1/4Hemoglobin A1c=11.9 -Lantus 15 units (home dose of 30 units): DC insulin drip 2 hours following administration -resistant SSI -consult to diabetic coordinator: Poorly controlled diabetic who needs significant teaching  concerning diabetic diet, diabetic llife style -Consult diabetic nutritionist. Poorly controlled diabetic who needs significant teaching concerning diabetic diet. -schedule follow-up appointment in 1-2 weeks with Dr.Eva Clelia CroftShaw DKA, diabetes Type 2 uncontrolled with complication, essential hypertension   Diabetic  Gastroparesis  --Reglan 10 mg TID -Thorazine 12.5 mg 1 -KUB pending : R/O ileus  HLD -Continue Lipitor 80 mg daily -Lipid panel pending  Essential HTN -3/19 increaseToprol-XL 37.5 BID -Hydralazine 25 mg TID   ESRD on HD M/W/F -Last HD session completed today -Nephrology consult if not discharged by Wednesday      DVT prophylaxis: sq heparin Code Status: full Family Communication: one Disposition Plan: discharge next 24-48 hours   Consultants:  none  Procedures/Significant Events:  none   I have personally reviewed and interpreted all radiology studies and my findings are as above.  VENTILATOR SETTINGS:    Cultures   Antimicrobials:    Devices    LINES / TUBES:      Continuous Infusions: . sodium chloride Stopped (03/20/17 0751)  . dextrose 5 % and 0.45% NaCl 75 mL/hr at 03/20/17 0323  . insulin (NOVOLIN-R) infusion 2.4 Units/hr (03/20/17 0749)     Objective: Vitals:   03/20/17 0430 03/20/17 0530 03/20/17 0600 03/20/17 0637  BP: (!) 179/95 (!) 172/96 (!) 178/96   Pulse: (!) 117 (!) 123 (!) 118   Resp: (!) 23 18 17    Temp:    98.1 F (36.7 C)  TempSrc:    Oral  SpO2: 93% 97% 99% 95%  Weight:      Height:        Intake/Output Summary (Last 24 hours) at 03/20/2017 0841 Last data  filed at 03/20/2017 0323 Gross per 24 hour  Intake 841.67 ml  Output -  Net 841.67 ml   Filed Weights   03/19/17 1902  Weight: 250 lb (113.4 kg)    Examination:  General: A/O 4,No acute respiratory distress Neck:  Negative scars, masses, torticollis, lymphadenopathy, JVD Lungs: Clear to auscultation bilaterally without wheezes or  crackles Cardiovascular: tachycardic,Regular rhythm without murmur gallop or rub normal S1 and S2 Abdomen: morbidly obese, negative abdominal pain, nondistended, positive soft, bowel sounds, no rebound, no ascites, no appreciable mass Extremities: No significant cyanosis, clubbing, or edema bilateral lower extremities Skin: Negative rashes, lesions, ulcers Psychiatric:  Negative depression, negative anxiety, negative fatigue, negative mania  Central nervous system:  Cranial nerves II through XII intact, tongue/uvula midline, all extremities muscle strength 5/5, sensation intact throughout,  negative dysarthria, negative expressive aphasia, negative receptive aphasia.  .     Data Reviewed: Care during the described time interval was provided by me .  I have reviewed this patient's available data, including medical history, events of note, physical examination, and all test results as part of my evaluation.   CBC: Recent Labs  Lab 03/19/17 1926  WBC 9.2  NEUTROABS 8.7*  HGB 12.2*  HCT 36.1*  MCV 88.3  PLT 184   Basic Metabolic Panel: Recent Labs  Lab 03/19/17 1926 03/20/17 0110 03/20/17 0603  NA 134* 136 140  K 4.4 4.5 4.2  CL 90* 96* 95*  CO2 19* 23 28  GLUCOSE 382* 278* 159*  BUN 30* 35* 35*  CREATININE 5.46* 5.80* 6.17*  CALCIUM 9.8 9.4 9.9   GFR: Estimated Creatinine Clearance: 20 mL/min (A) (by C-G formula based on SCr of 6.17 mg/dL (H)). Liver Function Tests: Recent Labs  Lab 03/19/17 1926  AST 33  ALT 20  ALKPHOS 98  BILITOT 1.5*  PROT 9.1*  ALBUMIN 4.2   Recent Labs  Lab 03/19/17 1926  LIPASE 39   No results for input(s): AMMONIA in the last 168 hours. Coagulation Profile: No results for input(s): INR, PROTIME in the last 168 hours. Cardiac Enzymes: No results for input(s): CKTOTAL, CKMB, CKMBINDEX, TROPONINI in the last 168 hours. BNP (last 3 results) No results for input(s): PROBNP in the last 8760 hours. HbA1C: No results for input(s): HGBA1C  in the last 72 hours. CBG: Recent Labs  Lab 03/20/17 0314 03/20/17 0428 03/20/17 0529 03/20/17 0629 03/20/17 0739  GLUCAP 184* 173* 171* 147* 144*   Lipid Profile: No results for input(s): CHOL, HDL, LDLCALC, TRIG, CHOLHDL, LDLDIRECT in the last 72 hours. Thyroid Function Tests: No results for input(s): TSH, T4TOTAL, FREET4, T3FREE, THYROIDAB in the last 72 hours. Anemia Panel: No results for input(s): VITAMINB12, FOLATE, FERRITIN, TIBC, IRON, RETICCTPCT in the last 72 hours. Urine analysis:    Component Value Date/Time   COLORURINE YELLOW 05/18/2016 2110   APPEARANCEUR HAZY (A) 05/18/2016 2110   LABSPEC 1.018 05/18/2016 2110   PHURINE 7.0 05/18/2016 2110   GLUCOSEU >=500 (A) 05/18/2016 2110   HGBUR NEGATIVE 05/18/2016 2110   BILIRUBINUR NEGATIVE 05/18/2016 2110   BILIRUBINUR neg 05/26/2014 1956   KETONESUR NEGATIVE 05/18/2016 2110   PROTEINUR >=300 (A) 05/18/2016 2110   UROBILINOGEN 0.2 09/30/2014 1730   NITRITE NEGATIVE 05/18/2016 2110   LEUKOCYTESUR NEGATIVE 05/18/2016 2110   Sepsis Labs: @LABRCNTIP (procalcitonin:4,lacticidven:4)  )No results found for this or any previous visit (from the past 240 hour(s)).       Radiology Studies: Dg Chest 2 View  Result Date: 03/19/2017 CLINICAL DATA:  Cough,  recent infiltrate EXAM: CHEST - 2 VIEW COMPARISON:  03/01/2017 FINDINGS: Cardiac shadow is enlarged but stable. Postsurgical changes are again noted. Previously seen vascular congestion has improved in the interval. No focal infiltrate or effusion is seen. No bony abnormality is noted. IMPRESSION: No active cardiopulmonary disease. Electronically Signed   By: Alcide Clever M.D.   On: 03/19/2017 19:44        Scheduled Meds: . aspirin EC  81 mg Oral Daily  . atorvastatin  80 mg Oral q1800  . [START ON 03/21/2017] calcitRIOL  1.25 mcg Oral Q M,W,F-HD  . calcium acetate  2,001 mg Oral TID WC  . [START ON 03/21/2017] cinacalcet  30 mg Oral Q M,W,F  . citalopram  40 mg Oral  QHS  . fluticasone  1 spray Each Nare Daily  . gabapentin  200 mg Oral TID  . heparin  5,000 Units Subcutaneous Q8H  . levothyroxine  50 mcg Oral QAC breakfast  . metoprolol succinate  25 mg Oral BID  . ondansetron      . pantoprazole  40 mg Oral BID   Continuous Infusions: . sodium chloride Stopped (03/20/17 0751)  . dextrose 5 % and 0.45% NaCl 75 mL/hr at 03/20/17 0323  . insulin (NOVOLIN-R) infusion 2.4 Units/hr (03/20/17 0749)     LOS: 1 day    Time spent: 40 minutes    Marykathleen Russi, Roselind Messier, MD [facility] Pager [phone]   If 7PM-7AM, please contact night-coverage [email or web address] Password [facility] 03/20/2017, 8:41 AM

## 2017-03-20 NOTE — ED Notes (Signed)
Insulin rate/dose change verified with Koleen NimrodAdrian, RN

## 2017-03-21 LAB — CBC
HEMATOCRIT: 34.7 % — AB (ref 39.0–52.0)
HEMOGLOBIN: 10.9 g/dL — AB (ref 13.0–17.0)
MCH: 29.1 pg (ref 26.0–34.0)
MCHC: 31.4 g/dL (ref 30.0–36.0)
MCV: 92.5 fL (ref 78.0–100.0)
Platelets: 206 10*3/uL (ref 150–400)
RBC: 3.75 MIL/uL — ABNORMAL LOW (ref 4.22–5.81)
RDW: 16.1 % — ABNORMAL HIGH (ref 11.5–15.5)
WBC: 9.2 10*3/uL (ref 4.0–10.5)

## 2017-03-21 LAB — BASIC METABOLIC PANEL
ANION GAP: 17 — AB (ref 5–15)
BUN: 48 mg/dL — ABNORMAL HIGH (ref 6–20)
CALCIUM: 9.5 mg/dL (ref 8.9–10.3)
CO2: 29 mmol/L (ref 22–32)
Chloride: 91 mmol/L — ABNORMAL LOW (ref 101–111)
Creatinine, Ser: 8.31 mg/dL — ABNORMAL HIGH (ref 0.61–1.24)
GFR calc non Af Amer: 7 mL/min — ABNORMAL LOW (ref >60)
GFR, EST AFRICAN AMERICAN: 8 mL/min — AB (ref >60)
Glucose, Bld: 148 mg/dL — ABNORMAL HIGH (ref 65–99)
POTASSIUM: 4.4 mmol/L (ref 3.5–5.1)
Sodium: 137 mmol/L (ref 135–145)

## 2017-03-21 LAB — LIPID PANEL
CHOLESTEROL: 134 mg/dL (ref 0–200)
HDL: 40 mg/dL — ABNORMAL LOW (ref >40)
LDL Cholesterol: 71 mg/dL (ref 0–99)
Total CHOL/HDL Ratio: 3.4 RATIO
Triglycerides: 113 mg/dL (ref <150)
VLDL: 23 mg/dL (ref 0–40)

## 2017-03-21 LAB — MAGNESIUM: MAGNESIUM: 2.1 mg/dL (ref 1.7–2.4)

## 2017-03-21 LAB — GLUCOSE, CAPILLARY
GLUCOSE-CAPILLARY: 138 mg/dL — AB (ref 65–99)
GLUCOSE-CAPILLARY: 140 mg/dL — AB (ref 65–99)
GLUCOSE-CAPILLARY: 144 mg/dL — AB (ref 65–99)
Glucose-Capillary: 115 mg/dL — ABNORMAL HIGH (ref 65–99)
Glucose-Capillary: 179 mg/dL — ABNORMAL HIGH (ref 65–99)
Glucose-Capillary: 196 mg/dL — ABNORMAL HIGH (ref 65–99)
Glucose-Capillary: 200 mg/dL — ABNORMAL HIGH (ref 65–99)

## 2017-03-21 MED ORDER — SODIUM CHLORIDE 0.9 % IV SOLN
12.5000 mg | Freq: Once | INTRAVENOUS | Status: AC
  Administered 2017-03-21: 12.5 mg via INTRAVENOUS
  Filled 2017-03-21: qty 0.5

## 2017-03-21 MED ORDER — SUCRALFATE 1 GM/10ML PO SUSP
1.0000 g | Freq: Once | ORAL | Status: AC
  Administered 2017-03-21: 1 g via ORAL
  Filled 2017-03-21: qty 10

## 2017-03-21 MED ORDER — POLYVINYL ALCOHOL 1.4 % OP SOLN
1.0000 [drp] | OPHTHALMIC | Status: DC | PRN
  Administered 2017-03-21: 1 [drp] via OPHTHALMIC
  Filled 2017-03-21: qty 15

## 2017-03-21 MED ORDER — ALUM & MAG HYDROXIDE-SIMETH 200-200-20 MG/5ML PO SUSP
30.0000 mL | Freq: Four times a day (QID) | ORAL | Status: DC | PRN
  Filled 2017-03-21: qty 30

## 2017-03-21 NOTE — Progress Notes (Addendum)
[id number] Bedside shift report, pt resting in bed. C/O nausea. No other complaints at this time. WCTM.   [email or web address] RN called to room, pt with nausea and dry heaving. Medicated per MAR. Will hold off on am meds until pt can tolerate. Pt assessed, see flow sheet. WCTM.   1030 Dr. Joseph ArtWoods paged for nephro consult. Pt states he needs HD today.   1150 Dr. Joseph ArtWoods paged for 2nd time, for n/v meds and possible HD orders/nephro consult. New orders received. Pt with no appetite, still at times dry heaving. WCTM  1400 Pt resting in bed, pt will have HD today per Nephro. No complaints at this time. WCTM.

## 2017-03-21 NOTE — Progress Notes (Signed)
Brief Nutrition Education Note  RD consulted for DM diet education.   Lab Results  Component Value Date   HGBA1C 11.9 01/05/2017   CBGS: 138-179 (inpatient orders for glycemic control are 0-20 units insulin aspart every 4 hours and 15 units insulin glargine daily).   Per DM coordinator note, PTA DM medications are victoza 0.3 mg daily, 30 units lantus daily, and 15-30 humalog TID (1:5 carb ratio). Pt is followed by Dr. Lafe GarinGherge of endocrinology as an outpatient; pt has had difficulty  affording medications related to insurance coverage and suspect this is largely contributing to suboptimal glycemic control.   Pt now NPO, related to nausea, vomiting, and occasional dry heaving (per nephrology notes, suspect gastroparesis flare-up). Pt not appropriate for education at this time. Please re-consult RD if further education needs arise.   Russell [name], RD, LDN, CDE Pager: [phone] After hours Pager: [phone]

## 2017-03-21 NOTE — Care Management Note (Addendum)
Case Management Note  Patient Details  Name: Fonnie BirkenheadSteven B Lame MRN: [id number] Date of Birth: [birth year]  Subjective/Objective:       DKA             Action/Plan: Transition to home today pending need for dialysis today, renal following.  Hospital f/u appointment scheduled by NCM with PCP for 04/02/2017 @ 1:20pm with Dr.Shaw. NCM made pt aware. Transportation to home will be provided by mom.  Expected Discharge Date:   03/21/2017           Expected Discharge Plan:  Home/Self Care  In-House Referral:     Discharge planning Services  Follow-up appt scheduled, CM Consult  Post Acute Care Choice:    Choice offered to:   patient  DME Arranged:    DME Agency:     HH Arranged:   RN HH Agency:    [facility], pending MD's order . MD aware order needed.  Status of Service:  Completed, signed off  If discussed at Long Length of Stay Meetings, dates discussed:    Additional Comments:  Epifanio LeschesCole, Tieasha Larsen Hudson, RN 03/21/2017, 11:41 AM

## 2017-03-21 NOTE — Progress Notes (Signed)
PROGRESS NOTE    Russell [name]  ZOX:[id number] DOB: 12/03/1970 DOA: 03/19/2017 PCP: [name], MD   Brief Narrative:  47 y.o. WM PMHx ESRD on HD M/W/F , DM Type 2 uncontrolled with complications, Gastroparesis, Hypothyroidism, Obesity, CAD- s/p CABG, restrictive airway disease.   Anxiety, Depression, CIDP (chronic inflammatory demyelinating polyneuropathy),  (LLE) DVT 01/2014-on Xarelto, HTN,   Presented to the ED with complaints of vomiting that started this morning after he completed the session of dialysis.  Pt reports multiple episodes of nonbilious nonbloody vomiting.  No abdominal pain or swelling, no loose or dark stools.  Pt denies shortness of breath or cough, fever or chills, no chest pains.  Denies ulcers or wounds. Patient does not make urine.  Patient reports compliance with his Lantus 30u BID and sliding scale NovoLog.   ED Course: Tachycardia-up to 111, tachypnea.  Elevated blood pressure 194/96. BMP-elevated anion gap of 25, low bicarb 19, CBG 382.  Elevated beta hydroxybutyric acid 4.46.  Normal lipase 39 x-ray negative for acute abnormality.  Patient was started on insulin drip in ED per DKA protocol.  Patient was given 250 ml bolus, started on IV fluid normal saline at 100 cc/h.    Subjective: 3/20 A/O 4, negative CP, negative SOB. Negative abdominal pain. Negative N/V.   Assessment & Plan:   Active Problems:   Hypothyroidism (acquired)   Uncontrolled diabetes mellitus type 2 with peripheral artery disease (HCC)   Diabetic ketoacidosis without coma associated with type 2 diabetes mellitus (HCC)   Essential hypertension, benign   Asthma  Diabetes Type 2 uncontrolled with complication/DKA -1/4Hemoglobin A1c=11.9 -Lantus 15 units (home dose of 30 units): DC insulin drip 2 hours following administration -resistant SSI -consult to diabetic coordinator: Poorly controlled diabetic who needs significant teaching concerning diabetic diet, diabetic llife  style -Consult diabetic nutritionist. Poorly controlled diabetic who needs significant teaching concerning diabetic diet. -schedule follow-up appointment in 1-2 weeks with Dr.Eva Clelia Croft DKA, diabetes Type 2 uncontrolled with complication, essential hypertension   Diabetic  Gastroparesis  --Reglan 10 mg TID -Thorazine 12.5 mg 1 -KUB: Negative for ileus/SBO  HLD -Continue Lipitor 80 mg daily -lipid panel not within ADA guidelines. -LDLgoal<70: Currently patient's LDL= 71 will allow PCP to add additional agent as outpatient.  Essential HTN -Toprol-XL 37.5 mg BID -Hydralazine 25 mg TID   ESRD on HD M/W/F -per nephrology note 3/20 will perform HD today. -discharge on 3/21      DVT prophylaxis: sq heparin Code Status: full Family Communication: one Disposition Plan: discharge next 24-48 hours   Consultants:  none  Procedures/Significant Events:  3/19 WJX:[id number] ileus/sBO   I have personally reviewed and interpreted all radiology studies and my findings are as above.  VENTILATOR SETTINGS:    Cultures   Antimicrobials:    Devices    LINES / TUBES:      Continuous Infusions: . dextrose 5 % and 0.45% NaCl Stopped (03/20/17 1304)  . insulin (NOVOLIN-R) infusion Stopped (03/20/17 2011)     Objective: Vitals:   03/20/17 2221 03/21/17 0505 03/21/17 1200 03/21/17 1512  BP: 129/83     Pulse:      Resp:   14   Temp:  97.7 F (36.5 C)  98.6 F (37 C)  TempSrc:  Oral  Oral  SpO2:      Weight:      Height:        Intake/Output Summary (Last 24 hours) at 03/21/2017 1655 Last data filed at 03/21/2017 0400 Gross  per 24 hour  Intake 784.97 ml  Output -  Net 784.97 ml   Filed Weights   03/19/17 1902 03/20/17 1315  Weight: 250 lb (113.4 kg) 254 lb 3.2 oz (115.3 kg)    Physical Exam:  General: atient was asleep but easily arousable, A/O 4,No acute respiratory distress Neck:  Negative scars, masses, torticollis, lymphadenopathy, JVD Lungs: Clear to  auscultation bilaterally without wheezes or crackles Cardiovascular: Regular rhythm and rate without murmur gallop or rub normal S1 and S2 Abdomen: morbidly obese, negative abdominal pain, nondistended, positive soft, bowel sounds, no rebound, no ascites, no appreciable mass Extremities: No significant cyanosis, clubbing, or edema bilateral lower extremities Skin: Negative rashes, lesions, ulcers Psychiatric:  Negative depression, negative anxiety, negative fatigue, negative mania  Central nervous system:  Cranial nerves II through XII intact, tongue/uvula midline, all extremities muscle strength 5/5, sensation intact throughout,  negative dysarthria, negative expressive aphasia, negative receptive aphasia.  .     Data Reviewed: Care during the described time interval was provided by me .  I have reviewed this patient's available data, including medical history, events of note, physical examination, and all test results as part of my evaluation.   CBC: Recent Labs  Lab 03/19/17 1926 03/21/17 0745  WBC 9.2 9.2  NEUTROABS 8.7*  --   HGB 12.2* 10.9*  HCT 36.1* 34.7*  MCV 88.3 92.5  PLT 184 206   Basic Metabolic Panel: Recent Labs  Lab 03/19/17 1926 03/20/17 0110 03/20/17 0603 03/21/17 0745  NA 134* 136 140 137  K 4.4 4.5 4.2 4.4  CL 90* 96* 95* 91*  CO2 19* 23 28 29   GLUCOSE 382* 278* 159* 148*  BUN 30* 35* 35* 48*  CREATININE 5.46* 5.80* 6.17* 8.31*  CALCIUM 9.8 9.4 9.9 9.5  MG  --   --   --  2.1   GFR: Estimated Creatinine Clearance: 15 mL/min (A) (by C-G formula based on SCr of 8.31 mg/dL (H)). Liver Function Tests: Recent Labs  Lab 03/19/17 1926  AST 33  ALT 20  ALKPHOS 98  BILITOT 1.5*  PROT 9.1*  ALBUMIN 4.2   Recent Labs  Lab 03/19/17 1926  LIPASE 39   No results for input(s): AMMONIA in the last 168 hours. Coagulation Profile: No results for input(s): INR, PROTIME in the last 168 hours. Cardiac Enzymes: No results for input(s): CKTOTAL, CKMB,  CKMBINDEX, TROPONINI in the last 168 hours. BNP (last 3 results) No results for input(s): PROBNP in the last 8760 hours. HbA1C: No results for input(s): HGBA1C in the last 72 hours. CBG: Recent Labs  Lab 03/20/17 2347 03/21/17 0502 03/21/17 0752 03/21/17 1136 03/21/17 1229  GLUCAP 196* 179* 138* 144* 140*   Lipid Profile: Recent Labs    03/21/17 0745  CHOL 134  HDL 40*  LDLCALC 71  TRIG 147  CHOLHDL 3.4   Thyroid Function Tests: No results for input(s): TSH, T4TOTAL, FREET4, T3FREE, THYROIDAB in the last 72 hours. Anemia Panel: No results for input(s): VITAMINB12, FOLATE, FERRITIN, TIBC, IRON, RETICCTPCT in the last 72 hours. Urine analysis:    Component Value Date/Time   COLORURINE YELLOW 05/18/2016 2110   APPEARANCEUR HAZY (A) 05/18/2016 2110   LABSPEC 1.018 05/18/2016 2110   PHURINE 7.0 05/18/2016 2110   GLUCOSEU >=500 (A) 05/18/2016 2110   HGBUR NEGATIVE 05/18/2016 2110   BILIRUBINUR NEGATIVE 05/18/2016 2110   BILIRUBINUR neg 05/26/2014 1956   KETONESUR NEGATIVE 05/18/2016 2110   PROTEINUR >=300 (A) 05/18/2016 2110   UROBILINOGEN 0.2 09/30/2014  1730   NITRITE NEGATIVE 05/18/2016 2110   LEUKOCYTESUR NEGATIVE 05/18/2016 2110   Sepsis Labs: @LABRCNTIP (procalcitonin:4,lacticidven:4)  )No results found for this or any previous visit (from the past 240 hour(s)).       Radiology Studies: Dg Chest 2 View  Result Date: 03/19/2017 CLINICAL DATA:  Cough, recent infiltrate EXAM: CHEST - 2 VIEW COMPARISON:  03/01/2017 FINDINGS: Cardiac shadow is enlarged but stable. Postsurgical changes are again noted. Previously seen vascular congestion has improved in the interval. No focal infiltrate or effusion is seen. No bony abnormality is noted. IMPRESSION: No active cardiopulmonary disease. Electronically Signed   By: Alcide CleverMark  Lukens M.D.   On: 03/19/2017 19:44   Dg Abd Portable 1v  Result Date: 03/20/2017 CLINICAL DATA:  Abdominal pain EXAM: PORTABLE ABDOMEN - 1 VIEW  COMPARISON:  None FINDINGS: Scattered large and small bowel gas is noted. Minimal retained fecal material is seen. No free air is noted. No obstructive changes are noted. Mild degenerative changes of the lumbar spine are seen. IMPRESSION: No acute abnormality noted. Electronically Signed   By: Alcide CleverMark  Lukens M.D.   On: 03/20/2017 09:58        Scheduled Meds: . aspirin EC  81 mg Oral Daily  . atorvastatin  80 mg Oral q1800  . calcitRIOL  1.25 mcg Oral Q M,W,F-HD  . calcium acetate  2,001 mg Oral TID WC  . cinacalcet  30 mg Oral Q M,W,F  . citalopram  40 mg Oral QHS  . fluticasone  1 spray Each Nare Daily  . gabapentin  200 mg Oral TID  . heparin  5,000 Units Subcutaneous Q8H  . hydrALAZINE  25 mg Oral TID  . insulin aspart  0-20 Units Subcutaneous Q4H  . insulin glargine  15 Units Subcutaneous Daily  . levothyroxine  50 mcg Oral QAC breakfast  . metoCLOPramide (REGLAN) injection  10 mg Intravenous Q8H  . metoprolol succinate  37.5 mg Oral BID  . pantoprazole  40 mg Oral BID   Continuous Infusions: . dextrose 5 % and 0.45% NaCl Stopped (03/20/17 1304)  . insulin (NOVOLIN-R) infusion Stopped (03/20/17 2011)     LOS: 2 days    Time spent: 40 minutes    WOODS, Roselind MessierURTIS J, MD [facility] Pager [phone]   If 7PM-7AM, please contact night-coverage [email or web address] Password [email or web address] 03/21/2017, 4:55 PM

## 2017-03-21 NOTE — Consult Note (Addendum)
[facility] Renal Consultation Note    Indication for Consultation:  Management of ESRD/hemodialysis; anemia, hypertension/volume and secondary hyperparathyroidism  HPI: Russell [name] is a 47 y.o. 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 Echo done last admission showed decline in EF to 25 - 30% thought. He also had heart cath that showed stable coronaries, but high filling pressures.  Patient was readmitted 3/18 with vomiting post HD , HTN and tachypnia.  CXR was neg for acute findings. BS only 382 but he had an AG of 25 and elevated beta hydroxybutyric acid of 4.6.  He was treated with Bolus of IV fluids and the IV at 100 per hour now about +1.5 L  Last HD treatment was 3/18.  He ran his full time with a net UF of 4.3 and post HD wt of 112.8 . Pre HD BP 209/118 range sitting and standing and post HD 140s/80s sitting and standing. His BP did not drop below 140/80s throughout treatment without tachycardia. He has had recent eye surgery and heparin with HD is on hold until his follow up visit which is scheduled tomorrow.   Labs today show K 4.4 glu 148 , hgb 10.0 Ca 9.5.  He is due for dialysis today.  He last vomiting at 8 am.  He denies abdominal pain, SOB, CP, diarrhea, fever or chills.  He is unclear what set his gastroparesis off.  He manages his meds via pill box and thinks his BP was high pre HD Monday due to holding am meds.  Past Medical History:  Diagnosis Date  . Anxiety   . Asthma   . 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 Coronary artery disease involving native coronary artery of native heart with unstable angina pectoris (HCC)    80% LAD-95% oD1 bifurcation lesion & 90%  RI --> referred for CABG  . Daily headache   . Depression   . DVT (deep venous thrombosis), H/o 01/2014-on Xarelto 03/24/2014   LLE  . ESRD (end stage renal disease) on dialysis [facility])    "[place], MWF" (06/29/2016)  . Gait abnormality 12/22/2016  . Gastroparesis 12/22/2016  . Hypertension   . Hypothyroidism   . Nephrotic syndrome 05/18/2014  . Neuropathy   . Type 2 diabetes mellitus with diabetic nephropathy [facility])    Past Surgical History:  Procedure Laterality Date  . ANKLE FRACTURE SURGERY Right 1988  . AV FISTULA PLACEMENT Left 01/01/2015   Procedure: CREATION OF LEFT RADIAL CEPHALIC ARTERIOVENOUS (AV) FISTULA ;  Surgeon: Pryor Ochoa, MD;  Location: [facility] OR;  Service: Vascular;  Laterality: Left;  . CAPD REMOVAL N/A 05/19/2016   Procedure: PD CATH REMOVAL;  Surgeon: Abigail Miyamoto, MD;  Location: [facility] OR;  Service: General;  Laterality: N/A;  . CARDIAC CATHETERIZATION N/A 09/22/2015   Procedure: Left Heart Cath and Coronary Angiography;  Surgeon: Iran Ouch, MD;  Location: [facility] INVASIVE CV LAB;  Service: Cardiovascular;  Laterality: N/A;  . CORONARY ARTERY BYPASS GRAFT N/A 09/28/2015   Procedure: CORONARY ARTERY BYPASS GRAFTING (CABG) x 3 UTILIZING LEFT MAMMARY ARTERY AND ENDOSCOPICALLY HARVESTED LEFT GREATER SAPHENOUS VEIN.;  Surgeon: Delight Ovens, MD;  Location: [facility] OR;  Service: Open Heart Surgery;  Laterality: N/A;  . ENDOVEIN HARVEST OF  GREATER SAPHENOUS VEIN Left 09/28/2015   Procedure: ENDOVEIN HARVEST OF GREATER SAPHENOUS VEIN;  Surgeon: Delight Ovens, MD;  Location: [facility] OR;  Service: Open Heart Surgery;  Laterality: Left;  . ESOPHAGOGASTRODUODENOSCOPY N/A 03/29/2014   Procedure: ESOPHAGOGASTRODUODENOSCOPY (EGD);  Surgeon: Dorena Cookey, MD;  Location: [facility] END[facility]COPY;  Service: Endoscopy;  Laterality: N/A;  . EYE SURGERY    . FRACTURE SURGERY    . INSERTION OF DIALYSIS CATHETER Right 01/05/2015   Procedure: INSERTION OF RIGHT INTERNAL JUGULAR DIALYSIS CATHETER;  Surgeon:  Fransisco Hertz, MD;  Location: [facility] OR;  Service: Vascular;  Laterality: Right;  . LEFT HEART CATH AND CORS/GRAFTS ANGIOGRAPHY N/A 09/13/2016   Procedure: LEFT HEART CATH AND CORS/GRAFTS ANGIOGRAPHY;  Surgeon: Kathleene Hazel, MD;  Location: [facility] INVASIVE CV LAB;  Service: Cardiovascular;  Laterality: N/A;  . RETINAL LASER PROCEDURE Bilateral   . RIGHT/LEFT HEART CATH AND CORONARY/GRAFT ANGIOGRAPHY N/A 03/06/2017   Procedure: RIGHT/LEFT HEART CATH AND CORONARY/GRAFT ANGIOGRAPHY;  Surgeon: Marykay Lex, MD;  Location: [facility] INVASIVE CV LAB;  Service: Cardiovascular;  Laterality: N/A;  . TEE WITHOUT CARDIOVERSION N/A 09/28/2015   Procedure: TRANSESOPHAGEAL ECHOCARDIOGRAM (TEE);  Surgeon: Delight Ovens, MD;  Location: [facility] OR;  Service: Open Heart Surgery;  Laterality: N/A;  . TONSILLECTOMY AND ADENOIDECTOMY  1970s   Family History  Problem Relation Age of Onset  . Obesity Mother        Patient states that family members have no other medical illnesses other than what I have described  . Kidney cancer Maternal Grandmother   . Cancer Father 50       AML  . Heart disease Unknown        No family history   Social History:  reports that  has never smoked. he has never used smokeless tobacco. He reports that he drinks alcohol. He reports that he does not use drugs. Allergies  Allergen Reactions  . Nsaids Other (See Comments)    Told to avoid all nsaids due to kidney disease   . Tape Other (See Comments)    Welts result, if left for a long amount of time   Prior to Admission medications   Medication Sig Start Date End Date Taking? Authorizing Provider  acetaminophen (TYLENOL) 500 MG tablet Take 1 tablet (500 mg total) by mouth every 6 (six) hours as needed for moderate pain. Patient taking differently: Take 1,000 mg by mouth every 6 (six) hours as needed (for pain).  09/29/14  Yes Albertine Grates, MD  albuterol (PROVENTIL HFA;VENTOLIN HFA) 108 (90 Base) MCG/ACT inhaler Inhale 1 puff into the lungs  every 6 (six) hours as needed for wheezing or shortness of breath. Patient taking differently: Inhale 2 puffs into the lungs every 8 (eight) hours as needed for wheezing or shortness of breath.  03/11/15  Yes [name], MD  albuterol (PROVENTIL) (2.5 MG/3ML) 0.083% nebulizer solution Take 3 mLs (2.5 mg total) by nebulization every 4 (four) hours as needed for wheezing. 09/20/16  Yes [name], MD  aspirin EC 81 MG EC tablet Take 1 tablet (81 mg total) by mouth daily. 10/06/15  Yes Barrett, Erin R, PA-C  atorvastatin (LIPITOR) 80 MG tablet Take 1 tablet (80 mg total) by mouth daily at 6 PM. 01/05/17  Yes Azalee Course, PA  calcitRIOL (ROCALTROL) 0.25 MCG capsule Take 5 capsules (1.25 mcg total) by mouth every Monday, Wednesday, and Friday with hemodialysis. 03/14/17 04/13/17 Yes Briant Cedar, MD  calcium acetate (PHOSLO) 667 MG capsule  Take 3 capsules (2,001 mg total) by mouth 3 (three) times daily with meals. 03/13/17 04/12/17 Yes Briant CedarEzenduka, Nkeiruka J, MD  cinacalcet (SENSIPAR) 30 MG tablet Take 30 mg by mouth every Monday, Wednesday, and Friday.   Yes [provider]  citalopram (CELEXA) 40 MG tablet Take 40 mg by mouth at bedtime.    Yes [provider]  clonazePAM (KLONOPIN) 2 MG tablet Take 2 mg by mouth as needed.    Yes [provider]  cyclobenzaprine (FLEXERIL) 10 MG tablet Take 1 tablet (10 mg total) 3 times/day as needed-between meals & bedtime by mouth for muscle spasms. 11/07/16  Yes Sherren MochaShaw, Eva N, MD  fluticasone (FLONASE) 50 MCG/ACT nasal spray Place 1 spray into both nostrils daily. 03/14/17  Yes Briant CedarEzenduka, Nkeiruka J, MD  gabapentin (NEURONTIN) 100 MG capsule Take 2 capsules (200 mg total) by mouth 3 (three) times daily. 03/13/17 04/12/17 Yes Briant CedarEzenduka, Nkeiruka J, MD  hydrALAZINE (APRESOLINE) 25 MG tablet Take 1 tablet (25 mg total) by mouth 3 (three) times daily as needed (take for blood pressure above 160 if not controlled with home meds). 10/15/16 10/15/17 Yes  Rolly SalterPatel, Pranav M, MD  HYDROcodone-acetaminophen (NORCO/VICODIN) 5-325 MG tablet Take 1-2 tablets by mouth every 4 (four) hours as needed for moderate pain. 08/17/16  Yes Sherren MochaShaw, Eva N, MD  HYDROmorphone (DILAUDID) 2 MG tablet Take 2 mg by mouth 3 (three) times daily.   Yes [provider]  hydrOXYzine (ATARAX/VISTARIL) 25 MG tablet Take 25 mg by mouth 2 (two) times daily as needed for anxiety.    Yes [provider]  Immune Globulin, Human, (PRIVIGEN) 40 GM/400ML SOLN Inject 40 g into the vein. 40gm/dayx 5 days and then 40gm q4 weeks   Yes [provider]  insulin glargine (LANTUS) 100 UNIT/ML injection Inject 0.3 mLs (30 Units total) into the skin at bedtime. [place] 01/05/17  Yes Carlus PavlovGherghe, Cristina, MD  insulin lispro (HUMALOG) 100 UNIT/ML injection Inject 0.15-0.3 mLs (15-30 Units total) into the skin every evening. Take one unit per 5 carbs depending on dinner. 11/29/15  Yes Carlus PavlovGherghe, Cristina, MD  levothyroxine (SYNTHROID, LEVOTHROID) 50 MCG tablet Take 1 tablet (50 mcg total) by mouth daily before breakfast. 01/05/17  Yes Carlus PavlovGherghe, Cristina, MD  liraglutide (VICTOZA) 18 MG/3ML SOPN Inject 0.3 mLs (1.8 mg total) into the skin daily before breakfast. 01/05/17  Yes Carlus PavlovGherghe, Cristina, MD  metoCLOPramide (REGLAN) 5 MG tablet Take 1 tablet (5 mg total) by mouth every 8 (eight) hours as needed for nausea or vomiting. Patient taking differently: Take 10 mg by mouth 3 (three) times daily.  01/10/16  Yes Dong, Robin, DO  metoprolol succinate (TOPROL-XL) 25 MG 24 hr tablet Take 1 tablet (25 mg total) by mouth 2 (two) times daily. 03/13/17 04/12/17 Yes Briant CedarEzenduka, Nkeiruka J, MD  ondansetron (ZOFRAN) 4 MG tablet Take 1 tablet (4 mg total) by mouth 3 (three) times daily as needed for nausea or vomiting. 03/01/17  Yes Mannam, Praveen, MD  OVER THE COUNTER MEDICATION Place 1 drop into the right eye 4 (four) times daily. OS 1015 Prednisolone /Gati/ Brom 1%-0.5 %-0.7% oph soln 3.5  [facility]  [phone] [address] [place], [place] [id number]   Yes [provider]  pantoprazole (PROTONIX) 40 MG tablet Take 1 tablet (40 mg total) by mouth 2 (two) times daily. 03/13/17 04/12/17 Yes Briant CedarEzenduka, Nkeiruka J, MD  ferric gluconate 62.5 mg in sodium chloride 0.9 % 100 mL Inject 62.5 mg into the vein every Wednesday with  hemodialysis. 03/14/17   Briant Cedar, MD   Current Facility-Administered Medications  Medication Dose Route Frequency Provider Last Rate Last Dose  . albuterol (PROVENTIL) (2.5 MG/3ML) 0.083% nebulizer solution 2.5 mg  2.5 mg Nebulization Q6H PRN Drema Dallas, MD      . aspirin EC tablet 81 mg  81 mg Oral Daily Emokpae, Ejiroghene E, MD      . atorvastatin (LIPITOR) tablet 80 mg  80 mg Oral q1800 Emokpae, Ejiroghene E, MD   80 mg at 03/20/17 1836  . calcitRIOL (ROCALTROL) capsule 1.25 mcg  1.25 mcg Oral Q M,W,F-HD Emokpae, Ejiroghene E, MD      . calcium acetate (PHOSLO) capsule 2,001 mg  2,001 mg Oral TID WC Emokpae, Ejiroghene E, MD      . cinacalcet (SENSIPAR) tablet 30 mg  30 mg Oral Q M,W,F Emokpae, Ejiroghene E, MD      . citalopram (CELEXA) tablet 40 mg  40 mg Oral QHS Emokpae, Ejiroghene E, MD   40 mg at 03/20/17 2221  . dextrose 5 %-0.45 % sodium chloride infusion   Intravenous Continuous Emokpae, Ejiroghene E, MD   Stopped at 03/20/17 1304  . fluticasone (FLONASE) 50 MCG/ACT nasal spray 1 spray  1 spray Each Nare Daily Emokpae, Ejiroghene E, MD      . gabapentin (NEURONTIN) capsule 200 mg  200 mg Oral TID Emokpae, Ejiroghene E, MD   200 mg at 03/20/17 2221  . heparin injection 5,000 Units  5,000 Units Subcutaneous Q8H Emokpae, Ejiroghene E, MD      . hydrALAZINE (APRESOLINE) tablet 25 mg  25 mg Oral TID Drema Dallas, MD   25 mg at 03/20/17 2234  . HYDROcodone-acetaminophen (NORCO/VICODIN) 5-325 MG per tablet 1-2 tablet  1-2 tablet Oral Q4H PRN Emokpae, Ejiroghene E, MD      . insulin aspart (novoLOG) injection 0-20 Units  0-20 Units  Subcutaneous Q4H Drema Dallas, MD   3 Units at 03/21/17 [phone]  . insulin glargine (LANTUS) injection 15 Units  15 Units Subcutaneous Daily Drema Dallas, MD   15 Units at 03/20/17 1846  . insulin regular (NOVOLIN R,HUMULIN R) 100 Units in sodium chloride 0.9 % 100 mL (1 Units/mL) infusion   Intravenous Continuous Dong, Robin, DO   Stopped at 03/20/17 2011  . labetalol (NORMODYNE,TRANDATE) injection 10 mg  10 mg Intravenous Q10 min PRN Emokpae, Ejiroghene E, MD   10 mg at 03/20/17 0940  . levothyroxine (SYNTHROID, LEVOTHROID) tablet 50 mcg  50 mcg Oral QAC breakfast Emokpae, Ejiroghene E, MD      . metoCLOPramide (REGLAN) injection 10 mg  10 mg Intravenous Q8H Drema Dallas, MD   10 mg at 03/21/17 0307  . metoprolol succinate (TOPROL-XL) 24 hr tablet 37.5 mg  37.5 mg Oral BID Drema Dallas, MD   37.5 mg at 03/20/17 2220  . ondansetron (ZOFRAN) injection 4 mg  4 mg Intravenous Q6H PRN Bodenheimer, Charles A, NP   4 mg at 03/21/17 1027  . pantoprazole (PROTONIX) EC tablet 40 mg  40 mg Oral BID Emokpae, Ejiroghene E, MD   40 mg at 03/20/17 2220  . polyvinyl alcohol (LIQUIFILM TEARS) 1.4 % ophthalmic solution 1 drop  1 drop Both Eyes PRN Drema Dallas, MD   1 drop at 03/21/17 [phone]  . promethazine (PHENERGAN) injection 12.5 mg  12.5 mg Intravenous Q6H PRN Emokpae, Ejiroghene E, MD   12.5 mg at 03/21/17 0846   Labs: Basic Metabolic Panel: Recent Labs  Lab 03/20/17 0110 03/20/17 0603 03/21/17 0745  NA 136 140 137  K 4.5 4.2 4.4  CL 96* 95* 91*  CO2 23 28 29   GLUCOSE 278* 159* 148*  BUN 35* 35* 48*  CREATININE 5.80* 6.17* 8.31*  CALCIUM 9.4 9.9 9.5   Liver Function Tests: Recent Labs  Lab 03/19/17 1926  AST 33  ALT 20  ALKPHOS 98  BILITOT 1.5*  PROT 9.1*  ALBUMIN 4.2   Recent Labs  Lab 03/19/17 1926  LIPASE 39   CBC: Recent Labs  Lab 03/19/17 1926 03/21/17 0745  WBC 9.2 9.2  NEUTROABS 8.7*  --   HGB 12.2* 10.9*  HCT 36.1* 34.7*  MCV 88.3 92.5  PLT 184 206    CBG: Recent Labs  Lab 03/20/17 1957 03/20/17 2347 03/21/17 0502 03/21/17 0752 03/21/17 1136  GLUCAP 125* 196* 179* 138* 144*   Iron Studies: No results for input(s): IRON, TIBC, TRANSFERRIN, FERRITIN in the last 72 hours. Studies/Results: Dg Chest 2 View  Result Date: 03/19/2017 CLINICAL DATA:  Cough, recent infiltrate EXAM: CHEST - 2 VIEW COMPARISON:  03/01/2017 FINDINGS: Cardiac shadow is enlarged but stable. Postsurgical changes are again noted. Previously seen vascular congestion has improved in the interval. No focal infiltrate or effusion is seen. No bony abnormality is noted. IMPRESSION: No active cardiopulmonary disease. Electronically Signed   By: Alcide Clever M.D.   On: 03/19/2017 19:44   Dg Abd Portable 1v  Result Date: 03/20/2017 CLINICAL DATA:  Abdominal pain EXAM: PORTABLE ABDOMEN - 1 VIEW COMPARISON:  None FINDINGS: Scattered large and small bowel gas is noted. Minimal retained fecal material is seen. No free air is noted. No obstructive changes are noted. Mild degenerative changes of the lumbar spine are seen. IMPRESSION: No acute abnormality noted. Electronically Signed   By: Alcide Clever M.D.   On: 03/20/2017 09:58    ROS: As per HPI otherwise negative.  Physical Exam: Vitals:   03/20/17 2126 03/20/17 2220 03/20/17 2221 03/21/17 0505  BP:  129/83 129/83   Pulse:      Resp:      Temp: 98.8 F (37.1 C)   97.7 F (36.5 C)  TempSrc: Oral   Oral  SpO2:      Weight:      Height:         General: WDWN NAD Head: NCAT right eye marked diminished vision MMM Neck: Supple.  Lungs: CTA bilaterally without wheezes, rales, or rhonchi. Breathing is unlabored. Heart: RRR with S1 S2.  Abdomen: soft NT + BS Lower extremities: without edema or ischemic changes, no open wounds  Neuro: A & O  X 3. Moves all extremities spontaneously. Psych:  Responds to questions appropriately with a normal affect. Dialysis Access: AVF left + bruit  Dialysis Orders: [place] 4.25 HR EDW  112.5 450/800 EDW 112.5 K 2  Ca 2.24 AVF profile 4 heparin none parsabiv 2.5 venofer 50/week no ESA calcitriol 1.25 heparin held post eye surgery  Assessment/Plan: 1.  DKA with poorly controlled DM and gastroparesis - labs corrected; meds per primary - 2.  ESRD -  MWF - HD today  3.  Hypertension/volume  - high IDWG in part likely related to poorly controlled BS- high filling pressure during recent cath BP well  controlled with hospital meds 4.  Anemia  - hgb 10.9 - not on ESA at present/ continue weekly Fe 5.  Metabolic bone disease -  continue calcitriol, sensipar/phoslo 6.  Nutrition - renal carb mod/fluid restriction- vits- npo  at present 7.   Depression/anxiety 8.   CM EF 25 - 35% high filling pressures last admission 9.    Recent right eye surgery - holding heparin   [name], PA-C [facility] [phone] 03/21/2017, 11:48 AM   Pt seen, examined and agree w A/P as above. ESRD pt w/ multiple recent admits here with DKA now.  Volume on exam is stable, plan HD today on schedule.   [name] MD [employer] pager [phone]   03/21/2017, 3:37 PM

## 2017-03-22 LAB — BASIC METABOLIC PANEL
ANION GAP: 14 (ref 5–15)
BUN: 27 mg/dL — AB (ref 6–20)
CALCIUM: 8.5 mg/dL — AB (ref 8.9–10.3)
CO2: 28 mmol/L (ref 22–32)
Chloride: 94 mmol/L — ABNORMAL LOW (ref 101–111)
Creatinine, Ser: 6.2 mg/dL — ABNORMAL HIGH (ref 0.61–1.24)
GFR calc Af Amer: 11 mL/min — ABNORMAL LOW (ref >60)
GFR calc non Af Amer: 10 mL/min — ABNORMAL LOW (ref >60)
Glucose, Bld: 123 mg/dL — ABNORMAL HIGH (ref 65–99)
POTASSIUM: 3.8 mmol/L (ref 3.5–5.1)
Sodium: 136 mmol/L (ref 135–145)

## 2017-03-22 LAB — CBC
HEMATOCRIT: 33.5 % — AB (ref 39.0–52.0)
HEMOGLOBIN: 10.7 g/dL — AB (ref 13.0–17.0)
MCH: 29.3 pg (ref 26.0–34.0)
MCHC: 31.9 g/dL (ref 30.0–36.0)
MCV: 91.8 fL (ref 78.0–100.0)
PLATELETS: 213 10*3/uL (ref 150–400)
RBC: 3.65 MIL/uL — ABNORMAL LOW (ref 4.22–5.81)
RDW: 15.5 % (ref 11.5–15.5)
WBC: 9.4 10*3/uL (ref 4.0–10.5)

## 2017-03-22 LAB — GLUCOSE, CAPILLARY
GLUCOSE-CAPILLARY: 140 mg/dL — AB (ref 65–99)
GLUCOSE-CAPILLARY: 139 mg/dL — AB (ref 65–99)
GLUCOSE-CAPILLARY: 161 mg/dL — AB (ref 65–99)
GLUCOSE-CAPILLARY: 157 mg/dL — AB (ref 65–99)
Glucose-Capillary: 133 mg/dL — ABNORMAL HIGH (ref 65–99)

## 2017-03-22 LAB — MAGNESIUM: MAGNESIUM: 2 mg/dL (ref 1.7–2.4)

## 2017-03-22 MED ORDER — PENTAFLUOROPROP-TETRAFLUOROETH EX AERO: 1.0000 "application " | INHALATION_SPRAY | CUTANEOUS | Status: DC | PRN

## 2017-03-22 MED ORDER — ALTEPLASE 2 MG IJ SOLR: 2.0000 mg | Freq: Once | INTRAMUSCULAR | Status: DC | PRN

## 2017-03-22 MED ORDER — HYDRALAZINE HCL 25 MG PO TABS: 25.0000 mg | ORAL_TABLET | Freq: Three times a day (TID) | ORAL | 0 refills | Status: DC

## 2017-03-22 MED ORDER — SUCRALFATE 1 G PO TABS: 1.0000 g | ORAL_TABLET | Freq: Four times a day (QID) | ORAL | 0 refills | Status: DC

## 2017-03-22 MED ORDER — CALCITRIOL 0.5 MCG PO CAPS
ORAL_CAPSULE | ORAL | Status: AC
  Administered 2017-03-22: 1.25 ug via ORAL
  Filled 2017-03-22: qty 2

## 2017-03-22 MED ORDER — METOPROLOL SUCCINATE ER 25 MG PO TB24: 37.5000 mg | ORAL_TABLET | Freq: Two times a day (BID) | ORAL | 0 refills | Status: DC

## 2017-03-22 MED ORDER — SODIUM CHLORIDE 0.9 % IV SOLN: 100.0000 mL | INTRAVENOUS | Status: DC | PRN

## 2017-03-22 MED ORDER — INSULIN GLARGINE 100 UNIT/ML ~~LOC~~ SOLN: 15.0000 [IU] | Freq: Every day | SUBCUTANEOUS | 0 refills | Status: DC

## 2017-03-22 MED ORDER — LIDOCAINE HCL (PF) 1 % IJ SOLN: 5.0000 mL | INTRAMUSCULAR | Status: DC | PRN

## 2017-03-22 MED ORDER — CALCITRIOL 0.25 MCG PO CAPS
ORAL_CAPSULE | ORAL | Status: AC
  Filled 2017-03-22: qty 1

## 2017-03-22 MED ORDER — LIDOCAINE-PRILOCAINE 2.5-2.5 % EX CREA: 1.0000 "application " | TOPICAL_CREAM | CUTANEOUS | Status: DC | PRN

## 2017-03-22 NOTE — Progress Notes (Signed)
Inpatient Diabetes Program Recommendations  AACE/ADA: New Consensus Statement on Inpatient Glycemic Control (2015)  Target Ranges:  Prepandial:   less than 140 mg/dL      Peak postprandial:   less than 180 mg/dL (1-2 hours)      Critically ill patients:  140 - 180 mg/dL   Lab Results  Component Value Date   GLUCAP 157 (H) 03/22/2017   HGBA1C 11.9 01/05/2017    Review of Glycemic Control  Long discussion with pt regarding his HgbA1C of 11.9%. Pt states he hasn't been able to afford his insulin since insurance doesn't cover Lantus. To see endo (Dr. Elvera LennoxGherghe) this week. Pt states he monitors his blood sugars and takes Novolog. Stressed importance of taking a basal insulin. [facility]. Pt has flat affect and multiple stressors. [facility] and has no questions about diet and exercise. Encouraged pt to f/u this week with endo.  Thank you. Ailene Ardshonda Dashauna Heymann, RD, LDN, CDE Inpatient Diabetes Coordinator [phone]           Inpatient Diabetes Program Recommendations:

## 2017-03-22 NOTE — Discharge Summary (Signed)
Physician Discharge Summary  Fonnie BirkenheadSteven B Chunn WGN:[id number] DOB: Jul 23, 1970 DOA: 03/19/2017  PCP: Sherren MochaShaw, Eva N, MD  Admit date: 03/19/2017 Discharge date: 03/23/2017  Time spent: 35 minutes  Recommendations for Outpatient Follow-up:  Diabetes Type 2 uncontrolled with complication/DKA -1/4Hemoglobin A1c=11.9 -Lantus 15 units (home dose of 30 units): DC insulin drip 2 hours following administration -resistant SSI -consult to diabetic coordinator: Poorly controlled diabetic who needs significant teaching concerning diabetic diet, diabetic llife style -Consult diabetic nutritionist. Poorly controlled diabetic who needs significant teaching concerning diabetic diet. -schedule follow-up appointment in 1-2 weeks with Dr.Eva Clelia CroftShaw DKA, diabetes Type 2 uncontrolled with complication, essential hypertension   Diabetic  Gastroparesis  -Reglan 10 mg TID -KUB: Negative for ileus/SBO  GERD -Carafate   HLD -Continue Lipitor 80 mg daily -lipid panel not within ADA guidelines. -LDLgoal<70: Currently patient's LDL= 71 will allow PCP to add additional agent as outpatient.   Essential HTN -Toprol-XL 37.5 mg BID -Hydralazine 25 mg TID   ESRD on HD M/W/F -HD per nephrology     Discharge Diagnoses:  Active Problems:   Hypothyroidism (acquired)   Uncontrolled diabetes mellitus type 2 with peripheral artery disease (HCC)   Diabetic ketoacidosis without coma associated with type 2 diabetes mellitus (HCC)   Essential hypertension, benign   Asthma   Discharge Condition: Stable  Diet recommendation: American diabetic Association  Filed Weights   03/20/17 1315 03/22/17 0040 03/22/17 0331  Weight: 254 lb 3.2 oz (115.3 kg) 248 lb 0.3 oz (112.5 kg) 246 lb 0.5 oz (111.6 kg)    History of present illness:  47 y.o. WM PMHx ESRD on HD M/W/F , DM Type 2 uncontrolled with complications, Gastroparesis, Hypothyroidism, Obesity, CAD- s/p CABG, restrictive airway disease.   Anxiety, Depression, CIDP  (chronic inflammatory demyelinating polyneuropathy),  (LLE) DVT 01/2014-on Xarelto, HTN,    Presented to the ED with complaints of vomiting that started this morning after he completed the session of dialysis.  Pt reports multiple episodes of nonbilious nonbloody vomiting.  No abdominal pain or swelling, no loose or dark stools.  Pt denies shortness of breath or cough, fever or chills, no chest pains.  Denies ulcers or wounds. Patient does not make urine.  Patient reports compliance with his Lantus 30u BID and sliding scale NovoLog.   ED Course: Tachycardia-up to 111, tachypnea.  Elevated blood pressure 194/96. BMP-elevated anion gap of 25, low bicarb 19, CBG 382.  Elevated beta hydroxybutyric acid 4.46.  Normal lipase 39 x-ray negative for acute abnormality.  Patient was started on insulin drip in ED per DKA protocol.  Patient was given 250 ml bolus, started on IV fluid normal saline at 100 cc/h.  During his hospitalization patient was treated for DKA, diabetes type 2 uncontrolled with complications, noncompliance with medication.  After patient's CBG brought under control using glucose stabilizer protocol signs and symptoms resolved.  Patient also received to schedule HD sessions during this admission.  Procedures: 3/19 HQI:ONGEXBMWKUB:negative ileus/sBO    Consultations: Nephrology     Discharge Exam: Vitals:   03/22/17 0942 03/22/17 1033 03/22/17 1218 03/22/17 1223  BP: (!) 199/114 (!) 186/108 (!) 162/96   Pulse:      Resp: (!) 22 (!) 25 17   Temp:    98.1 F (36.7 C)  TempSrc:    Oral  SpO2:      Weight:      Height:        General: A/O 4,No acute respiratory distress Neck:  Negative scars, masses, torticollis, lymphadenopathy, JVD  Lungs: Clear to auscultation bilaterally without wheezes or crackles Cardiovascular: Regular rhythm and rate without murmur gallop or rub normal S1 and S2  Discharge Instructions   Allergies as of 03/22/2017      Reactions   Nsaids Other (See Comments)    Told to avoid all nsaids due to kidney disease    Tape Other (See Comments)   Welts result, if left for a long amount of time      Allergies  Allergen Reactions  . Nsaids Other (See Comments)    Told to avoid all nsaids due to kidney disease   . Tape Other (See Comments)    Welts result, if left for a long amount of time   Follow-up Information    [name], MD. Go on 04/02/2017.   Specialty:  Family Medicine Why:   follow-up appointment scheduled for 04/02/2017 at 1:20 pm with Dr.Eva Clelia Croft, DKA, diabetes Type 2 uncontrolled with complication, essential hypertension Contact information: [address] [place] [address] [phone]        [facility], [facility] Follow up.   Specialty:  Home Health Services Why:  home health services arranged Contact information: [address] [place] [address] [phone]            The results of significant diagnostics from this hospitalization (including imaging, microbiology, ancillary and laboratory) are listed below for reference.    Significant Diagnostic Studies: Dg Chest 2 View  Result Date: 03/19/2017 CLINICAL DATA:  Cough, recent infiltrate EXAM: CHEST - 2 VIEW COMPARISON:  03/01/2017 FINDINGS: Cardiac shadow is enlarged but stable. Postsurgical changes are again noted. Previously seen vascular congestion has improved in the interval. No focal infiltrate or effusion is seen. No bony abnormality is noted. IMPRESSION: No active cardiopulmonary disease. Electronically Signed   By: Alcide Clever M.D.   On: 03/19/2017 19:44   Dg Chest 2 View  Result Date: 03/01/2017 CLINICAL DATA:  Cough EXAM: CHEST  2 VIEW COMPARISON:  10/13/2016 chest radiograph. FINDINGS: Intact sternotomy wires. Stable cardiomediastinal silhouette with borderline mild cardiomegaly. No pneumothorax. No pleural effusion. Slight prominence of the interstitial markings. No acute consolidative airspace disease. IMPRESSION: Borderline mild  cardiomegaly and slight prominence of the interstitial markings. Mild congestive heart failure is not excluded. Electronically Signed   By: Delbert Phenix M.D.   On: 03/01/2017 16:02   Ct Chest Wo Contrast  Result Date: 02/22/2017 CLINICAL DATA:  Shortness of breath. Pulmonary nodule. Smoking history. EXAM: CT CHEST WITHOUT CONTRAST TECHNIQUE: Multidetector CT imaging of the chest was performed following the standard protocol without IV contrast. COMPARISON:  10/14/2016 FINDINGS: Cardiovascular: Normal caliber of the thoracic aorta. Prior CABG. LAD and left circumflex coronary artery atherosclerosis. Normal heart size. No pericardial effusion. Mediastinum/Nodes: Mildly enlarged mediastinal lymph nodes are stable to minimally smaller than on the prior study, with the largest measuring 12 mm in short axis in the right paratracheal station. No enlarged axillary or hilar lymph nodes are identified. The esophagus and included thyroid are grossly unremarkable. Lungs/Pleura: No pleural effusion or pneumothorax. There is a new area of masslike consolidation in the central right upper lobe measuring 3.1 x 2.3 cm with mild patchy ground-glass opacity more peripherally. Minimal scattered tree-in-bud nodularity is noted in the lower lobes. There is a background of slight mosaic attenuation throughout the lungs. Upper Abdomen: Small calcification in the spleen. Musculoskeletal: No suspicious osseous lesion. IMPRESSION: 1. 3 cm masslike opacity in the central right upper lobe, new from 10/2016. Considerations include  pneumonia and a rapidly growing primary lung cancer or metastasis. If the patient has symptoms of pulmonary infection, consider short-term follow-up CT in 6 weeks following antimicrobial therapy to assess for improvement. Otherwise, consider PET-CT and tissue sampling. 2. Stable to minimally decreased size of mediastinal lymph nodes. These results will be called to the ordering clinician or representative by the  Radiologist Assistant, and communication documented in the PACS or zVision Dashboard. Electronically Signed   By: Sebastian Ache M.D.   On: 02/22/2017 13:40   Ct Angio Chest Pe W And/or Wo Contrast  Result Date: 03/01/2017 CLINICAL DATA:  Nausea and hypertension this morning. High pretest probability for pulmonary embolism. History of nephrotic syndrome, on dialysis. EXAM: CT ANGIOGRAPHY CHEST WITH CONTRAST TECHNIQUE: Multidetector CT imaging of the chest was performed using the standard protocol during bolus administration of intravenous contrast. Multiplanar CT image reconstructions and MIPs were obtained to evaluate the vascular anatomy. CONTRAST:  ISOVUE-370 IOPAMIDOL (ISOVUE-370) INJECTION 76% COMPARISON:  02/22/2017 FINDINGS: Cardiovascular: Satisfactory opacification of the pulmonary arteries to the segmental level. No evidence of pulmonary embolism. Cardiomegaly with hypertrophic appearance of the left ventricle. Atherosclerotic calcification, status post CABG. Detection of graft patency is limited due to early aortic opacification. Mediastinum/Nodes: Borderline right paratracheal lymph node size that is stable at up to 12 mm in short axis. Lungs/Pleura: Focal airspace opacity in the right middle lobe persists but appears slightly decreased in bulk and there is slightly decreased regional airspace opacity. Pneumonia is favored over a mass. Upper Abdomen: Negative Musculoskeletal: No acute or aggressive finding. Review of the MIP images confirms the above findings. IMPRESSION: 1. Negative for pulmonary embolism. 2. Focal opacity in the right middle lobe as described on chest CT 7 days prior. There has been subtle decrease in volume favoring improving pneumonia over a neoplasm. This finding is occult radiographically; recommend chest CT follow-up in 6-12 weeks. 3. Cardiomegaly and atherosclerosis. Electronically Signed   By: Marnee Spring M.D.   On: 03/01/2017 15:58   Dg Esophagus  Result Date:  03/08/2017 CLINICAL DATA:  Dysphagia.  Pneumonia EXAM: ESOPHOGRAM / BARIUM SWALLOW / BARIUM TABLET STUDY TECHNIQUE: Combined double contrast and single contrast examination performed using effervescent crystals, thick barium liquid, and thin barium liquid. The patient was observed with fluoroscopy swallowing a 13 mm barium sulphate tablet. FLUOROSCOPY TIME:  Fluoroscopy Time:  2 minutes 0 second Radiation Exposure Index (if provided by the fluoroscopic device): Number of Acquired Spot Images: 0 COMPARISON:  Chest CT 03/01/2017 FINDINGS: Poor esophageal motility with numerous tertiary contractions noted. Negative for stricture or mass. Negative for hiatal hernia. Small amount of silent aspiration on 1 attempt of swallowing. Barium tablet initially became lodged in the mid esophagus and then passed with additional swallows of water. No stricture is seen in this area. IMPRESSION: Poor esophageal motility without stricture or mass Mild silent aspiration. Electronically Signed   By: Marlan Palau M.D.   On: 03/08/2017 14:11   Dg Abd Portable 1v  Result Date: 03/20/2017 CLINICAL DATA:  Abdominal pain EXAM: PORTABLE ABDOMEN - 1 VIEW COMPARISON:  None FINDINGS: Scattered large and small bowel gas is noted. Minimal retained fecal material is seen. No free air is noted. No obstructive changes are noted. Mild degenerative changes of the lumbar spine are seen. IMPRESSION: No acute abnormality noted. Electronically Signed   By: Alcide Clever M.D.   On: 03/20/2017 09:58   Dg Swallowing Func-speech Pathology  Result Date: 03/09/2017 Objective Swallowing Evaluation: Type of Study: MBS-Modified Barium  Swallow Study  Patient Details Name: EDAN JUDAY MRN: [id number] Date of Birth: 03/22/1970 Today's Date: 03/09/2017 Time: SLP Start Time (ACUTE ONLY): 1345 -SLP Stop Time (ACUTE ONLY): 1405 SLP Time Calculation (min) (ACUTE ONLY): 20 min Past Medical History: Past Medical History: Diagnosis Date . Anxiety  . Asthma  . CIDP  (chronic inflammatory demyelinating polyneuropathy) (HCC) 01/10/2017 . Coronary artery disease involving native coronary artery of native heart with unstable angina pectoris (HCC)   80% LAD-95% oD1 bifurcation lesion & 90% RI --> referred for CABG . Daily headache  . Depression  . DVT (deep venous thrombosis), H/o 01/2014-on Xarelto 03/24/2014  LLE . ESRD (end stage renal disease) on dialysis [name])   "[place], MWF" (06/29/2016) . Gait abnormality 12/22/2016 . Gastroparesis 12/22/2016 . Hypertension  . Hypothyroidism  . Nephrotic syndrome 05/18/2014 . Neuropathy  . Type 2 diabetes mellitus with diabetic nephropathy [facility])  Past Surgical History: Past Surgical History: Procedure Laterality Date . ANKLE FRACTURE SURGERY Right 1988 . AV FISTULA PLACEMENT Left 01/01/2015  Procedure: CREATION OF LEFT RADIAL CEPHALIC ARTERIOVENOUS (AV) FISTULA ;  Surgeon: Pryor Ochoa, MD;  Location[facility] OR;  Service: Vascular;  Laterality: Left; . CAPD REMOVAL N/A 05/19/2016  Procedure: PD CATH REMOVAL;  Surgeon: Abigail Miyamoto, MD;  Location: [facility] OR;  Service: General;  Laterality: N/A; . CARDIAC CATHETERIZATION N/A 09/22/2015  Procedure: Left Heart Cath and Coronary Angiography;  Surgeon: Iran Ouch, MD;  Location: [facility] INVASIVE CV LAB;  Service: Cardiovascular;  Laterality: N/A; . CORONARY ARTERY BYPASS GRAFT N/A 09/28/2015  Procedure: CORONARY ARTERY BYPASS GRAFTING (CABG) x 3 UTILIZING LEFT MAMMARY ARTERY AND ENDOSCOPICALLY HARVESTED LEFT GREATER SAPHENOUS VEIN.;  Surgeon: Delight Ovens, MD;  Location: [facility] OR;  Service: Open Heart Surgery;  Laterality: N/A; . ENDOVEIN HARVEST OF GREATER SAPHENOUS VEIN Left 09/28/2015  Procedure: ENDOVEIN HARVEST OF GREATER SAPHENOUS VEIN;  Surgeon: Delight Ovens, MD;  Location: [facility] OR;  Service: Open Heart Surgery;  Laterality: Left; . ESOPHAGOGASTRODUODENOSCOPY N/A 03/29/2014  Procedure: ESOPHAGOGASTRODUODENOSCOPY (EGD);  Surgeon: Dorena Cookey, MD;  Location: [facility];  Service: Endoscopy;   Laterality: N/A; . EYE SURGERY   . FRACTURE SURGERY   . INSERTION OF DIALYSIS CATHETER Right 01/05/2015  Procedure: INSERTION OF RIGHT INTERNAL JUGULAR DIALYSIS CATHETER;  Surgeon: Fransisco Hertz, MD;  Location: [facility] OR;  Service: Vascular;  Laterality: Right; . LEFT HEART CATH AND CORS/GRAFTS ANGIOGRAPHY N/A 09/13/2016  Procedure: LEFT HEART CATH AND CORS/GRAFTS ANGIOGRAPHY;  Surgeon: Kathleene Hazel, MD;  Location: [facility] INVASIVE CV LAB;  Service: Cardiovascular;  Laterality: N/A; . RETINAL LASER PROCEDURE Bilateral  . RIGHT/LEFT HEART CATH AND CORONARY/GRAFT ANGIOGRAPHY N/A 03/06/2017  Procedure: RIGHT/LEFT HEART CATH AND CORONARY/GRAFT ANGIOGRAPHY;  Surgeon: Marykay Lex, MD;  Location: [facility] INVASIVE CV LAB;  Service: Cardiovascular;  Laterality: N/A; . TEE WITHOUT CARDIOVERSION N/A 09/28/2015  Procedure: TRANSESOPHAGEAL ECHOCARDIOGRAM (TEE);  Surgeon: Delight Ovens, MD;  Location: [facility] OR;  Service: Open Heart Surgery;  Laterality: N/A; . TONSILLECTOMY AND ADENOIDECTOMY  1970s HPI: RANARD HARTE is a 47 y.o. male With pmhx significant for anxiety, asthma, CAD, DVT, depression, gastroparesis, severe erosive esophagitis, DM II who presented with SOB. He was recently diagnosed with CIDP. CTA 03/01/17 showed pna. Per MD notes, pt reports coughing while eating. EGD 03/29/14 findings of severe exudative circumferential esophagitis, appearance consistent with severe reflux.  Subjective: Pt alert and agreeable to testing Assessment / Plan / Recommendation CHL IP CLINICAL IMPRESSIONS 03/09/2017 Clinical Impression Pt presents with one occurrence of  slight penetration with initial, larger sip of thin liquid. Partial amount of penetrates cleared from laryngeal spontaneously, with remainder cleared following cued throat clear from SLP. No other observed airway compromise observed despite challenging with consecutive sips with straw and using mixed consistencies (pill and thin liquid). Pt had mild amount of vallecular residue  with solid trials that he cleared spontaneously with subsequent swallows. Oral phase was significant only for min premature spillage with thin liquids. Esophagus scan during pill trial revealed suspected reduced pill propulsion through esophagus consistent with results from esophagram yesterday. Recommend continuing regular solids and thin liquid diet with aspiration and esophageal precautions. Recommend possible follow up with GI. SLP services no longer indicated, please re-consult in the event of acute changes.  SLP Visit Diagnosis Dysphagia, unspecified (R13.10) Attention and concentration deficit following -- Frontal lobe and executive function deficit following -- Impact on safety and function Mild aspiration risk   CHL IP TREATMENT RECOMMENDATION 03/09/2017 Treatment Recommendations No treatment recommended at this time   Prognosis 03/09/2017 Prognosis for Safe Diet Advancement Good Barriers to Reach Goals -- Barriers/Prognosis Comment -- CHL IP DIET RECOMMENDATION 03/09/2017 SLP Diet Recommendations Regular solids;Thin liquid Liquid Administration via Straw;Cup Medication Administration Whole meds with liquid Compensations Slow rate;Small sips/bites;Follow solids with liquid Postural Changes Seated upright at 90 degrees;Remain semi-upright after after feeds/meals (Comment)   CHL IP OTHER RECOMMENDATIONS 03/09/2017 Recommended Consults Consider GI evaluation Oral Care Recommendations Oral care BID Other Recommendations --   CHL IP FOLLOW UP RECOMMENDATIONS 03/09/2017 Follow up Recommendations None   CHL IP FREQUENCY AND DURATION 03/03/2017 Speech Therapy Frequency (ACUTE ONLY) min 1 x/week Treatment Duration 1 week      CHL IP ORAL PHASE 03/09/2017 Oral Phase Impaired Oral - Pudding Teaspoon -- Oral - Pudding Cup -- Oral - Honey Teaspoon -- Oral - Honey Cup -- Oral - Nectar Teaspoon -- Oral - Nectar Cup -- Oral - Nectar Straw -- Oral - Thin Teaspoon -- Oral - Thin Cup Premature spillage Oral - Thin Straw Premature spillage  Oral - Puree WFL Oral - Mech Soft -- Oral - Regular WFL Oral - Multi-Consistency -- Oral - Pill Reduced posterior propulsion Oral Phase - Comment --  CHL IP PHARYNGEAL PHASE 03/09/2017 Pharyngeal Phase Impaired Pharyngeal- Pudding Teaspoon -- Pharyngeal -- Pharyngeal- Pudding Cup -- Pharyngeal -- Pharyngeal- Honey Teaspoon -- Pharyngeal -- Pharyngeal- Honey Cup -- Pharyngeal -- Pharyngeal- Nectar Teaspoon -- Pharyngeal -- Pharyngeal- Nectar Cup -- Pharyngeal -- Pharyngeal- Nectar Straw -- Pharyngeal -- Pharyngeal- Thin Teaspoon -- Pharyngeal -- Pharyngeal- Thin Cup Penetration/Aspiration during swallow;Other (Comment) Pharyngeal Material enters airway, remains ABOVE vocal cords and not ejected out Pharyngeal- Thin Straw WFL Pharyngeal -- Pharyngeal- Puree Pharyngeal residue - valleculae;Reduced tongue base retraction Pharyngeal -- Pharyngeal- Mechanical Soft -- Pharyngeal -- Pharyngeal- Regular Pharyngeal residue - valleculae;Reduced tongue base retraction Pharyngeal -- Pharyngeal- Multi-consistency -- Pharyngeal -- Pharyngeal- Pill WFL Pharyngeal -- Pharyngeal Comment --  CHL IP CERVICAL ESOPHAGEAL PHASE 03/09/2017 Cervical Esophageal Phase WFL Pudding Teaspoon -- Pudding Cup -- Honey Teaspoon -- Honey Cup -- Nectar Teaspoon -- Nectar Cup -- Nectar Straw -- Thin Teaspoon -- Thin Cup -- Thin Straw -- Puree -- Mechanical Soft -- Regular -- Multi-consistency -- Pill -- Cervical Esophageal Comment -- No flowsheet data found. Maxcine Ham 03/09/2017, 4:35 PM  Maxcine Ham, M.A. CCC-SLP [phone]              Microbiology: No results found for this or any previous visit (from the past 240 hour(s)).  Labs: Basic Metabolic Panel: Recent Labs  Lab 03/19/17 1926 03/20/17 0110 03/20/17 0603 03/21/17 0745 03/22/17 0625  NA 134* 136 140 137 136  K 4.4 4.5 4.2 4.4 3.8  CL 90* 96* 95* 91* 94*  CO2 19* 23 28 29 28   GLUCOSE 382* 278* 159* 148* 123*  BUN 30* 35* 35* 48* 27*  CREATININE 5.46* 5.80* 6.17*  8.31* 6.20*  CALCIUM 9.8 9.4 9.9 9.5 8.5*  MG  --   --   --  2.1 2.0   Liver Function Tests: Recent Labs  Lab 03/19/17 1926  AST 33  ALT 20  ALKPHOS 98  BILITOT 1.5*  PROT 9.1*  ALBUMIN 4.2   Recent Labs  Lab 03/19/17 1926  LIPASE 39   No results for input(s): AMMONIA in the last 168 hours. CBC: Recent Labs  Lab 03/19/17 1926 03/21/17 0745 03/22/17 0625  WBC 9.2 9.2 9.4  NEUTROABS 8.7*  --   --   HGB 12.2* 10.9* 10.7*  HCT 36.1* 34.7* 33.5*  MCV 88.3 92.5 91.8  PLT 184 206 213   Cardiac Enzymes: No results for input(s): CKTOTAL, CKMB, CKMBINDEX, TROPONINI in the last 168 hours. BNP: BNP (last 3 results) Recent Labs    05/16/16 1740 05/23/16 1558  BNP 662.2* 299.0*    ProBNP (last 3 results) No results for input(s): PROBNP in the last 8760 hours.  CBG: Recent Labs  Lab 03/22/17 0016 03/22/17 0417 03/22/17 0835 03/22/17 0931 03/22/17 1222  GLUCAP 133* 140* 139* 161* 157*       Signed:  Carolyne Littles, MD [facility] [phone] pager

## 2017-03-22 NOTE — Consult Note (Signed)
   [facility] CM [facility] Consult   03/22/2017  Fonnie BirkenheadSteven B Soland [birth year] [id number]  Follow up:  Patient was discharging home and able to meet with the patient at the bedside. Patient states he has an appointment with Dr. Elvera LennoxGherghe (endocrinologist) this week.  Patient states he will [facility] the [facility].  He was also agreeable to Telephonic RNCM for follow up.   Charlesetta ShanksVictoria Gatsby Chismar, RN BSN CCM [facility]  [phone] business mobile phone Toll free office [phone]

## 2017-03-22 NOTE — Progress Notes (Signed)
[facility] Progress Note   Dialysis Orders: [place] 4.25 HR EDW 112.5 450/800 EDW 112.5 K 2  Ca 2.25 left AVF profile 4 heparin none parsabiv 2.5 venofer 50/week no ESA calcitriol 1.25 heparin held post eye surgery  Assessment/Plan: 1. DKA with poorly controlled DM and gastroparesis - labs corrected; meds per primary - no vomiting since 8 am yesterday - still on CL 2. ESRD -  MWF - system clotted on HD likely due to no heparin and pt decided to terminate tmt early- - post wt 111.6- had about 2.5 hours K 3.8- lower edw for d/c; will order HD for Friday am in case he is still here  3. Hypertension/volume  - high IDWG in part likely related to poorly controlled BS- high filling pressure during recent cath BP well controlled with hospital meds - BP high this am- just got am meds about an hour ago 4. Anemia  - hgb 10.7 - not on ESA at present/ continue weekly Fe 5. Metabolic bone disease -  continue calcitriol, sensipar/phoslo 6. Nutrition - renal carb mod/fluid restriction- vits- npo at present 7.   Depression/anxiety- 8.   CM EF 25 - 35% high filling pressures last admission- titrating EDW down 9.   Recent right eye surgery - holding heparin but has gotten SQ heparin  [name], PA-C [facility] Beeper [phone] 03/22/2017,10:25 AM  LOS: 3 days   Pt seen, examined and agree w A/P as above. Pt for dc today, we will lower edw and challenge further in the OP setting.   Russell Moselle MD [employer] pager [phone]   03/22/2017, 1:29 PM    Subjective:   System clotted on HD last night and he chose to terminate treatment instead of having the machine restrung. Tells me he is leaving today some time for eye MD f/u  Objective Vitals:   03/22/17 0331 03/22/17 0408 03/22/17 0834 03/22/17 0940  BP: (!) 177/86 (!) 181/98  (!) 199/114  Pulse: 97     Resp: (!) 21 16    Temp: 98 F (36.7 C) 98.3 F (36.8 C) 98.1 F (36.7 C)   TempSrc: Oral  Oral Oral   SpO2: 95%     Weight: 111.6 kg (246 lb 0.5 oz)     Height:       Physical Exam General: NAD lying on side Heart: tachy rate low 100s Lungs: crackles right base Abdomen: soft NT Extremities: no LE edema Dialysis Access: left AVF + bruit   Additional Objective Labs: Basic Metabolic Panel: Recent Labs  Lab 03/20/17 0603 03/21/17 0745 03/22/17 0625  NA 140 137 136  K 4.2 4.4 3.8  CL 95* 91* 94*  CO2 28 29 28   GLUCOSE 159* 148* 123*  BUN 35* 48* 27*  CREATININE 6.17* 8.31* 6.20*  CALCIUM 9.9 9.5 8.5*   Liver Function Tests: Recent Labs  Lab 03/19/17 1926  AST 33  ALT 20  ALKPHOS 98  BILITOT 1.5*  PROT 9.1*  ALBUMIN 4.2   Recent Labs  Lab 03/19/17 1926  LIPASE 39   CBC: Recent Labs  Lab 03/19/17 1926 03/21/17 0745 03/22/17 0625  WBC 9.2 9.2 9.4  NEUTROABS 8.7*  --   --   HGB 12.2* 10.9* 10.7*  HCT 36.1* 34.7* 33.5*  MCV 88.3 92.5 91.8  PLT 184 206 213   Blood Culture    Component Value Date/Time   SDES BLOOD RIGHT HAND 05/16/2016 2032   SPECREQUEST  05/16/2016 2032  BOTTLES DRAWN AEROBIC AND ANAEROBIC Blood Culture adequate volume   CULT NO GROWTH 5 DAYS 05/16/2016 2032   REPTSTATUS 05/21/2016 FINAL 05/16/2016 2032    Cardiac Enzymes: No results for input(s): CKTOTAL, CKMB, CKMBINDEX, TROPONINI in the last 168 hours. CBG: Recent Labs  Lab 03/21/17 2010 03/22/17 0016 03/22/17 0417 03/22/17 0835 03/22/17 0931  GLUCAP 200* 133* 140* 139* 161*   Iron Studies: No results for input(s): IRON, TIBC, TRANSFERRIN, FERRITIN in the last 72 hours. Lab Results  Component Value Date   INR 1.18 03/06/2017   INR 1.0 09/12/2016   INR 1.25 05/16/2016   Studies/Results: No results found. Medications: . dextrose 5 % and 0.45% NaCl Stopped (03/20/17 1304)  . insulin (NOVOLIN-R) infusion Stopped (03/20/17 2011)   . aspirin EC  81 mg Oral Daily  . atorvastatin  80 mg Oral q1800  . calcitRIOL  1.25 mcg Oral Q M,W,F-HD  . calcium acetate   2,001 mg Oral TID WC  . cinacalcet  30 mg Oral Q M,W,F  . citalopram  40 mg Oral QHS  . fluticasone  1 spray Each Nare Daily  . gabapentin  200 mg Oral TID  . heparin  5,000 Units Subcutaneous Q8H  . hydrALAZINE  25 mg Oral TID  . insulin aspart  0-20 Units Subcutaneous Q4H  . insulin glargine  15 Units Subcutaneous Daily  . levothyroxine  50 mcg Oral QAC breakfast  . metoCLOPramide (REGLAN) injection  10 mg Intravenous Q8H  . metoprolol succinate  37.5 mg Oral BID  . pantoprazole  40 mg Oral BID

## 2017-03-22 NOTE — Progress Notes (Signed)
Russell [name] to be D/C'd Home per MD order.  Discussed with the patient and all questions fully answered.  VSS, Skin clean, dry and intact without evidence of skin break down, no evidence of skin tears noted. IV catheter discontinued intact. Site without signs and symptoms of complications. Dressing and pressure applied.  An After Visit Summary was printed and given to the patient. Patient received prescription.  D/c education completed with patient/family including follow up instructions, medication list, d/c activities limitations if indicated, with other d/c instructions as indicated by MD - patient able to verbalize understanding, all questions fully answered.   Patient instructed to return to ED, call 911, or call MD for any changes in condition.   Patient escorted via WC, and D/C home via private auto.  Russell [name] 03/22/2017 3:23 PM

## 2017-03-23 ENCOUNTER — Other Ambulatory Visit: Payer: Self-pay

## 2017-03-23 DIAGNOSIS — N2581 Secondary hyperparathyroidism of renal origin: Secondary | ICD-10-CM | POA: Diagnosis not present

## 2017-03-23 DIAGNOSIS — D631 Anemia in chronic kidney disease: Secondary | ICD-10-CM | POA: Diagnosis not present

## 2017-03-23 DIAGNOSIS — D509 Iron deficiency anemia, unspecified: Secondary | ICD-10-CM | POA: Diagnosis not present

## 2017-03-23 DIAGNOSIS — E1129 Type 2 diabetes mellitus with other diabetic kidney complication: Secondary | ICD-10-CM | POA: Diagnosis not present

## 2017-03-23 DIAGNOSIS — N186 End stage renal disease: Secondary | ICD-10-CM | POA: Diagnosis not present

## 2017-03-26 ENCOUNTER — Other Ambulatory Visit: Payer: Self-pay | Admitting: Pharmacist

## 2017-03-26 DIAGNOSIS — D509 Iron deficiency anemia, unspecified: Secondary | ICD-10-CM | POA: Diagnosis not present

## 2017-03-26 DIAGNOSIS — N2581 Secondary hyperparathyroidism of renal origin: Secondary | ICD-10-CM | POA: Diagnosis not present

## 2017-03-26 DIAGNOSIS — D631 Anemia in chronic kidney disease: Secondary | ICD-10-CM | POA: Diagnosis not present

## 2017-03-26 DIAGNOSIS — N186 End stage renal disease: Secondary | ICD-10-CM | POA: Diagnosis not present

## 2017-03-26 DIAGNOSIS — E1129 Type 2 diabetes mellitus with other diabetic kidney complication: Secondary | ICD-10-CM | POA: Diagnosis not present

## 2017-03-26 NOTE — Patient Outreach (Signed)
[facility] Care Management  03/26/2017  Russell [name] May 05, 1970 [id number]  Successful phone outreach to patient, HIPAA details verified.  Noted per chart review, patient was admitted 3/18-3/21/19, and discharged on Lantus.     Patient confirms he does not have Lantus as it appears to not be covered by his Part D plan.    Discussed with patient Dr Elvera LennoxGherghe had replied to [facility] Pharmac[facility] with the following:   ----- Message -----  From: Russell PavlovGherghe, Cristina, MD  Sent: 03/19/2017  5:51 PM  To: Russell [name], [facility]   Caryn BeeKevin,  We can change to Basaglar instead of Lantus.  Do you know whether a weekly GLP1 R agonist will be covered for him?  Thank you,  Russell Pavlovristina Gherghe MD   She had also asked about coverage of once weekly GLP-1 agonist, noted per review of patient's Part D preferred drug list, both, Ozempic and Victoza appeared to be covered Tier 3 medications.    Patient reports he is less concerned about Victoza and more concerned about affordability of his long acting insulin.   He does not have an appointment scheduled with endocrinology----he was counseled to contact endocrinologist office for appointment and agrees to do so.    Will send a message to Dr Elvera LennoxGherghe to see if she is willing to prescribe covered basal insulin or requires patient to have office visit for prescription.   Plan:  Continue to follow-up with patient.    Russell [name], PharmD, [facility]ACP Clinical Pharmacist [facility] [phone]

## 2017-03-28 ENCOUNTER — Other Ambulatory Visit: Payer: Self-pay

## 2017-03-28 ENCOUNTER — Encounter: Payer: Self-pay | Admitting: *Deleted

## 2017-03-28 ENCOUNTER — Other Ambulatory Visit: Payer: Self-pay | Admitting: *Deleted

## 2017-03-28 DIAGNOSIS — D509 Iron deficiency anemia, unspecified: Secondary | ICD-10-CM | POA: Diagnosis not present

## 2017-03-28 DIAGNOSIS — N2581 Secondary hyperparathyroidism of renal origin: Secondary | ICD-10-CM | POA: Diagnosis not present

## 2017-03-28 DIAGNOSIS — N186 End stage renal disease: Secondary | ICD-10-CM | POA: Diagnosis not present

## 2017-03-28 DIAGNOSIS — D631 Anemia in chronic kidney disease: Secondary | ICD-10-CM | POA: Diagnosis not present

## 2017-03-28 DIAGNOSIS — E1129 Type 2 diabetes mellitus with other diabetic kidney complication: Secondary | ICD-10-CM | POA: Diagnosis not present

## 2017-03-28 MED ORDER — BASAGLAR KWIKPEN 100 UNIT/ML ~~LOC~~ SOPN: 15.0000 [IU] | PEN_INJECTOR | Freq: Every day | SUBCUTANEOUS | 0 refills | Status: DC

## 2017-03-28 NOTE — Patient Outreach (Signed)
[facility]) Care Management  03/28/2017  Russell [name] 02-03-1970 [id number]   RN Health Coach Introductory Call  Referral Date:  03/23/2017 Referral Source:  Hospital Liaison Reason for Referral:  Hospital Discharge/Trouble Affording Medications/Disease Management Education Insurance:  Medicare   Outreach Attempt:  Successful telephone outreach to patient for introduction call.  HIPAA verified with patient.  RN Health Coach introduced self and role.  Patient verbally agrees to monthly telephone outreaches; requesting calls on his non hemodialysis days (Tuesdays and Thursdays).  States he is weak and tired today after hemodialysis.  RN Health Coach inquired about Endocrinologist follow up appointment with Dr. Elvera [name].  Patient stated he was trying to arrange follow up appointment for next week.  Patient encouraged to try to arrange appointment as soon as possible to assist with him obtaining his insulin.    Plan:  RN Health Coach will make telephone outreach to complete initial telephone assessment within the next 20 business days.  Russell LernerFarrah Cresencia Asmus RN [facility] Care [facility]  RN Health Coach [phone] [name]

## 2017-03-29 ENCOUNTER — Ambulatory Visit (HOSPITAL_COMMUNITY): Payer: Medicare Other

## 2017-03-30 ENCOUNTER — Telehealth: Payer: Self-pay

## 2017-03-30 ENCOUNTER — Ambulatory Visit: Payer: Medicare Other | Admitting: Pulmonary Disease

## 2017-03-30 ENCOUNTER — Other Ambulatory Visit: Payer: Self-pay | Admitting: Pharmacist

## 2017-03-30 ENCOUNTER — Ambulatory Visit: Payer: Medicare Other | Admitting: Family Medicine

## 2017-03-30 DIAGNOSIS — E1129 Type 2 diabetes mellitus with other diabetic kidney complication: Secondary | ICD-10-CM | POA: Diagnosis not present

## 2017-03-30 DIAGNOSIS — D631 Anemia in chronic kidney disease: Secondary | ICD-10-CM | POA: Diagnosis not present

## 2017-03-30 DIAGNOSIS — N186 End stage renal disease: Secondary | ICD-10-CM | POA: Diagnosis not present

## 2017-03-30 DIAGNOSIS — N2581 Secondary hyperparathyroidism of renal origin: Secondary | ICD-10-CM | POA: Diagnosis not present

## 2017-03-30 DIAGNOSIS — D509 Iron deficiency anemia, unspecified: Secondary | ICD-10-CM | POA: Diagnosis not present

## 2017-03-30 NOTE — Patient Outreach (Signed)
[facility] Care Management  03/30/2017  Russell BirkenheadSteven B [name] 10-14-1970 [id number]  Unsuccessful phone outreach to patient to follow-up if he obtained his basal insulin Dr Elvera LennoxGherghe sent to his pharmacy and attempt to complete discharge medication review.    No answer, unable to leave message and voicemail was full.   Plan:  Second outreach attempt to patient next week.   Tommye StandardKevin Maribel Luis, PharmD, [facility]ACP Clinical Pharmacist [facility] [phone]

## 2017-03-30 NOTE — Telephone Encounter (Signed)
lmtcb x1 for pt to reschedule 03/30/17 OV.

## 2017-04-02 ENCOUNTER — Ambulatory Visit (INDEPENDENT_AMBULATORY_CARE_PROVIDER_SITE_OTHER): Payer: Medicare Other | Admitting: Family Medicine

## 2017-04-02 ENCOUNTER — Encounter: Payer: Self-pay | Admitting: Family Medicine

## 2017-04-02 ENCOUNTER — Other Ambulatory Visit: Payer: Self-pay

## 2017-04-02 ENCOUNTER — Other Ambulatory Visit: Payer: Self-pay | Admitting: Pharmacist

## 2017-04-02 VITALS — BP 183/103 | HR 116 | Temp 98.6°F | Resp 16 | Ht 74.0 in | Wt 256.0 lb

## 2017-04-02 DIAGNOSIS — E1142 Type 2 diabetes mellitus with diabetic polyneuropathy: Secondary | ICD-10-CM

## 2017-04-02 DIAGNOSIS — R1310 Dysphagia, unspecified: Secondary | ICD-10-CM

## 2017-04-02 DIAGNOSIS — E1129 Type 2 diabetes mellitus with other diabetic kidney complication: Secondary | ICD-10-CM | POA: Diagnosis not present

## 2017-04-02 DIAGNOSIS — E1151 Type 2 diabetes mellitus with diabetic peripheral angiopathy without gangrene: Secondary | ICD-10-CM

## 2017-04-02 DIAGNOSIS — N2581 Secondary hyperparathyroidism of renal origin: Secondary | ICD-10-CM | POA: Diagnosis not present

## 2017-04-02 DIAGNOSIS — E039 Hypothyroidism, unspecified: Secondary | ICD-10-CM

## 2017-04-02 DIAGNOSIS — K3184 Gastroparesis: Secondary | ICD-10-CM

## 2017-04-02 DIAGNOSIS — N186 End stage renal disease: Secondary | ICD-10-CM

## 2017-04-02 DIAGNOSIS — Q631 Lobulated, fused and horseshoe kidney: Secondary | ICD-10-CM

## 2017-04-02 DIAGNOSIS — I10 Essential (primary) hypertension: Secondary | ICD-10-CM | POA: Diagnosis not present

## 2017-04-02 DIAGNOSIS — E1165 Type 2 diabetes mellitus with hyperglycemia: Secondary | ICD-10-CM | POA: Diagnosis not present

## 2017-04-02 DIAGNOSIS — N049 Nephrotic syndrome with unspecified morphologic changes: Secondary | ICD-10-CM | POA: Diagnosis not present

## 2017-04-02 DIAGNOSIS — Z992 Dependence on renal dialysis: Secondary | ICD-10-CM

## 2017-04-02 DIAGNOSIS — G6181 Chronic inflammatory demyelinating polyneuritis: Secondary | ICD-10-CM

## 2017-04-02 DIAGNOSIS — G894 Chronic pain syndrome: Secondary | ICD-10-CM

## 2017-04-02 DIAGNOSIS — IMO0002 Reserved for concepts with insufficient information to code with codable children: Secondary | ICD-10-CM

## 2017-04-02 DIAGNOSIS — M791 Myalgia, unspecified site: Secondary | ICD-10-CM | POA: Diagnosis not present

## 2017-04-02 DIAGNOSIS — M792 Neuralgia and neuritis, unspecified: Secondary | ICD-10-CM

## 2017-04-02 DIAGNOSIS — R9389 Abnormal findings on diagnostic imaging of other specified body structures: Secondary | ICD-10-CM

## 2017-04-02 DIAGNOSIS — J9611 Chronic respiratory failure with hypoxia: Secondary | ICD-10-CM

## 2017-04-02 DIAGNOSIS — G8929 Other chronic pain: Secondary | ICD-10-CM

## 2017-04-02 DIAGNOSIS — I2510 Atherosclerotic heart disease of native coronary artery without angina pectoris: Secondary | ICD-10-CM | POA: Diagnosis not present

## 2017-04-02 DIAGNOSIS — J90 Pleural effusion, not elsewhere classified: Secondary | ICD-10-CM

## 2017-04-02 DIAGNOSIS — D631 Anemia in chronic kidney disease: Secondary | ICD-10-CM | POA: Diagnosis not present

## 2017-04-02 DIAGNOSIS — D509 Iron deficiency anemia, unspecified: Secondary | ICD-10-CM | POA: Diagnosis not present

## 2017-04-02 NOTE — Patient Instructions (Addendum)
I will ask Dr. Elvera LennoxGherghe to get you on her schedule on a Tues or Thursday monrning and MyChart you with the appointment info.  Call Dr. Shirlee MoreMannam's office to reschedule the PET/CT chest scan and the follow-up office visit with him to review.  Continue the victoza and add in the Lantus 15u.  Please come back and see me to discuss your chronic neuropathic pain and come up with a treatment regimen that is more effective for you.   IF you received an x-ray today, you will receive an invoice from [facility]. Please contact [facility] at [phone] with questions or concerns regarding your invoice.   IF you received labwork today, you will receive an invoice from [facility]. Please contact [facility] at [phone] with questions or concerns regarding your invoice.   Our billing staff will not be able to assist you with questions regarding bills from these companies.  You will be contacted with the lab results as soon as they are available. The fastest way to get your results is to activate your My Chart account. Instructions are located on the last page of this paperwork. If you have not heard from us regarding the results in 2 weeks, please contact this office.

## 2017-04-02 NOTE — Patient Outreach (Signed)
[facility] Care Management  04/02/2017  Russell BirkenheadSteven B [name] Jan 26, 1970 [id number]  Second unsuccessful phone outreach to patient.  Unable to leave message as voicemail was full.    Plan:  Will send outreach letter.  Final phone outreach attempt in about 10 business days.

## 2017-04-02 NOTE — Progress Notes (Signed)
Subjective:  By signing my name below, I, Russell [name], attest that this documentation has been prepared under the direction and in the presence of Russell Cheadle, MD. Electronically Signed: [name], Russell [name]. 04/02/2017 , 2:32 PM .  Patient was seen in Room 1 .   Patient ID: Russell [name], male    DOB: [birth year], 47 y.o.   MRN: [id number] Chief Complaint  Patient presents with  . Hospitalization Follow-up    DKA  and pnuemonia, nausea and headache    HPI Russell [name] is a 47 y.o. male who presents to Primary Care at [facility] for hospital follow up. I had met patience twice, both times 8 months prior. Since that time, patient has been hospitalized 5 times. He was hospitalized from 2/28 through 3/12 for healthcare associated pneumonia on top of his numerous comorbid uncontrolled medical problems. At that hospitalization, an echo showed his EF had decreased to 30% so cardiology proceeded with catheterization which showed CAD was stable. Suspected worsening acute heart failure was related to volume-macrovascular ischemia.   He was also noted to have a lung mass on admission. He was recommended to have a follow up evaluation PET scan in 4-5 weeks time, which is about now. Radiographic esophagram done which showed poor esophageal motility with mild silent aspiration. Requested that I arrange follow up with GI for further evaluation of dysphagia. Due to decrease in EF of 25-30% from prior 65%, cardiology recommended more aggressive hemodialysis and strict diet restriction. His metoprolol decreased due to hypotension during dialysis down to 27m BID with PRN hydralazine. Also recommended that I consider tapering patient off of his chronic narcotic for chronic pain syndrome; had prescribed patient hydrocodone 5-325 #60 once 8 months prior. Patient was previously on Lantus and humalog for diabetes, but now on novolog due to cost[facility]. Patient is supposed to  follow up with Dr. GCruzita Ledererfor diabetes management, and is trying to have veteran's affairs benefits started. He was also diagnosed with CIDP and is followed by Russell [name] receives treatment once a month with IV drip; reports some things have changed for the better, and others without improvement.   Patient was then hospitalized from 3/18 to 3/21 for DKA; was noted that he needed significant teaching about diabetic diet and lifestyle. Recommended he follow up with diabetic nutritionist and restarted. Patient had PET scan to recheck lung mass on 3/28, and pulmonology visit to review hospital visit on 3/29; did not keep these appointments. He does not have any follow up appointments scheduled with pulmonology, endocrinology or diabetic nutrition. Dr. GCruzita Ledererrefilled patient's Basaglar insulin 15 units QHS and continue to sliding scale meal time insulin. Prior to last hospitalization, he was on Lantus 30 units BID, appears his metoprolol was increased to 37.552mBID and noted that LDL was 71, so not at goal of <70, though he's been on Lipitor 8051mith note that I should add additional medications to further lower LDL. Dr. GheCruzita Ledererso manages his thyroid.   Today, patient states he hasn't made any appointments because he's busy with doing dialysis about 3 times a week, which really drains him and needs a day to recover. He reports A1C was done recently at the dialysis clinic. Patient states he didn't go to pulmonology because he thought only needed hospital imaging. He requested medication refills of gabapentin, flexeril, hydrocodone and reglan. He last used albuterol inhaler about 3-4 weeks ago. He's been checking his [name] sugar, which has  been running in the 200-220s, with rare 180s. He denies hypoglycemic symptoms. He has half a pen of Victoza left.   Medications He denies receiving hydrocodone from other providers. He's been taking it 1-2 per week. He reports taking dilaudid in the past and did well on  that. He takes it for neuropathy and CIPD as well. He denies constipation or grogginess.  He's taking cyclobenzaprine every night, but sometimes in the morning too. He denies having fatigue or tiredness.  He's been taking 2 tablets 3 times a day of reglan.  He takes hydralazine 2-3 times a week for BP spikes. BP spikes has symptoms of headache and lightheadedness; usually checks his BP which runs about 818 systolic.  He also takes Phoslo when he eats.   Dialysis He has dialysis at [facility].   Past Medical History:  Diagnosis Date  . Anxiety   . Asthma   . CIDP (chronic inflammatory demyelinating polyneuropathy) ([place]) 01/10/2017  . 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 Daily headache   . Depression   . DVT (deep venous thrombosis), H/o 01/2014-on Xarelto 03/24/2014   LLE  . ESRD (end stage renal disease) on dialysis [facility])    "[place], MWF" (06/29/2016)  . Gait abnormality 12/22/2016  . Gastroparesis 12/22/2016  . Hypertension   . Hypothyroidism   . Nephrotic syndrome 05/18/2014  . Neuropathy   . Type 2 diabetes mellitus with diabetic nephropathy [facility])    Past Surgical History:  Procedure Laterality Date  . ANKLE FRACTURE SURGERY Right 1988  . AV FISTULA PLACEMENT Left 01/01/2015   Procedure: CREATION OF LEFT RADIAL CEPHALIC ARTERIOVENOUS (AV) FISTULA ;  Surgeon: Mal Misty, MD;  Location: [place];  Service: Vascular;  Laterality: Left;  . CAPD REMOVAL N/A 05/19/2016   Procedure: PD CATH REMOVAL;  Surgeon: Coralie Keens, MD;  Location: [place];  Service: General;  Laterality: N/A;  . CARDIAC CATHETERIZATION N/A 09/22/2015   Procedure: Left Heart Cath and Coronary Angiography;  Surgeon: Wellington Hampshire, MD;  Location: [place] CV LAB;  Service: Cardiovascular;  Laterality: N/A;  . CORONARY ARTERY BYPASS GRAFT N/A  09/28/2015   Procedure: CORONARY ARTERY BYPASS GRAFTING (CABG) x 3 UTILIZING LEFT MAMMARY ARTERY AND ENDOSCOPICALLY HARVESTED LEFT GREATER SAPHENOUS VEIN.;  Surgeon: [name], MD;  Location: [place];  Service: Open Heart Surgery;  Laterality: N/A;  . ENDOVEIN HARVEST OF GREATER SAPHENOUS VEIN Left 09/28/2015   Procedure: ENDOVEIN HARVEST OF GREATER SAPHENOUS VEIN;  Surgeon: [name], MD;  Location: [place];  Service: Open Heart Surgery;  Laterality: Left;  . ESOPHAGOGASTRODUODENOSCOPY N/A 03/29/2014   Procedure: ESOPHAGOGASTRODUODENOSCOPY (EGD);  Surgeon: Teena Irani, MD;  Location[facility];  Service: Endoscopy;  Laterality: N/A;  . EYE SURGERY    . FRACTURE SURGERY    . INSERTION OF DIALYSIS CATHETER Right 01/05/2015   Procedure: INSERTION OF RIGHT INTERNAL JUGULAR DIALYSIS CATHETER;  Surgeon: Conrad Healy Lake, MD;  Location: [place];  Service: Vascular;  Laterality: Right;  . LEFT HEART CATH AND CORS/GRAFTS ANGIOGRAPHY N/A 09/13/2016   Procedure: LEFT HEART CATH AND CORS/GRAFTS ANGIOGRAPHY;  Surgeon: Burnell Blanks, MD;  Location: [place] CV LAB;  Service: Cardiovascular;  Laterality: N/A;  . RETINAL LASER PROCEDURE Bilateral   . RIGHT/LEFT HEART CATH AND CORONARY/GRAFT ANGIOGRAPHY N/A 03/06/2017   Procedure: RIGHT/LEFT HEART CATH  AND CORONARY/GRAFT ANGIOGRAPHY;  Surgeon: Leonie Man, MD;  Location: [place] CV LAB;  Service: Cardiovascular;  Laterality: N/A;  . TEE WITHOUT CARDIOVERSION N/A 09/28/2015   Procedure: TRANSESOPHAGEAL ECHOCARDIOGRAM (TEE);  Surgeon: [name], MD;  Location: [place];  Service: Open Heart Surgery;  Laterality: N/A;  . TONSILLECTOMY AND ADENOIDECTOMY  1970s   Prior to Admission medications   Medication Sig Start Date End Date Taking? Authorizing Provider  acetaminophen (TYLENOL) 500 MG tablet Take 1 tablet (500 mg total) by mouth every 6 (six) hours as needed for moderate pain. Patient taking differently: Take 1,000 mg by mouth every 6  (six) hours as needed (for pain).  09/29/14   Florencia Reasons, MD  albuterol (PROVENTIL HFA;VENTOLIN HFA) 108 (90 Base) MCG/ACT inhaler Inhale 1 puff into the lungs every 6 (six) hours as needed for wheezing or shortness of breath. Patient taking differently: Inhale 2 puffs into the lungs every 8 (eight) hours as needed for wheezing or shortness of breath.  03/11/15   [name], MD  aspirin EC 81 MG EC tablet Take 1 tablet (81 mg total) by mouth daily. 10/06/15   Barrett, Erin R, PA-C  atorvastatin (LIPITOR) 80 MG tablet Take 1 tablet (80 mg total) by mouth daily at 6 PM. 01/05/17   Almyra Deforest, PA  calcium acetate (PHOSLO) 667 MG capsule Take 3 capsules (2,001 mg total) by mouth 3 (three) times daily with meals. 03/13/17 04/12/17  Alma Friendly, MD  cinacalcet (SENSIPAR) 30 MG tablet Take 30 mg by mouth every Monday, Wednesday, and Friday.    [provider]  citalopram (CELEXA) 40 MG tablet Take 40 mg by mouth at bedtime.     [provider]  clonazePAM (KLONOPIN) 2 MG tablet Take 2 mg by mouth as needed.     [provider]  cyclobenzaprine (FLEXERIL) 10 MG tablet Take 1 tablet (10 mg total) 3 times/day as needed-between meals & bedtime by mouth for muscle spasms. 11/07/16   Shawnee Knapp, MD  ferric gluconate 62.5 mg in sodium chloride 0.9 % 100 mL Inject 62.5 mg into the vein every Wednesday with hemodialysis. 03/14/17   Alma Friendly, MD  fluticasone (FLONASE) 50 MCG/ACT nasal spray Place 1 spray into both nostrils daily. 03/14/17   Alma Friendly, MD  gabapentin (NEURONTIN) 100 MG capsule Take 2 capsules (200 mg total) by mouth 3 (three) times daily. 03/13/17 04/12/17  Alma Friendly, MD  hydrALAZINE (APRESOLINE) 25 MG tablet Take 1 tablet (25 mg total) by mouth 3 (three) times daily. 03/22/17   Allie Bossier, MD  HYDROcodone-acetaminophen (NORCO/VICODIN) 5-325 MG tablet Take 1-2 tablets by mouth every 4 (four) hours as needed for moderate pain. 08/17/16    Shawnee Knapp, MD  Immune Globulin, Human, (PRIVIGEN) 40 GM/400ML SOLN Inject 40 Russell into the vein. 40gm/dayx 5 days and then 40gm q4 weeks    [provider]  Insulin Glargine (BASAGLAR KWIKPEN) 100 UNIT/ML SOPN Inject 0.15 mLs (15 Units total) into the skin at bedtime. 03/28/17   Philemon Kingdom, MD  insulin lispro (HUMALOG) 100 UNIT/ML injection Inject 0.15-0.3 mLs (15-30 Units total) into the skin every evening. Take one unit per 5 carbs depending on dinner. 11/29/15   Philemon Kingdom, MD  levothyroxine (SYNTHROID, LEVOTHROID) 50 MCG tablet Take 1 tablet (50 mcg total) by mouth daily before breakfast. 01/05/17   Philemon Kingdom, MD  liraglutide (VICTOZA) 18 MG/3ML SOPN Inject 0.3 mLs (1.8 mg total) into the skin  daily before breakfast. 01/05/17   Philemon Kingdom, MD  metoCLOPramide (REGLAN) 5 MG tablet Take 1 tablet (5 mg total) by mouth every 8 (eight) hours as needed for nausea or vomiting. Patient taking differently: Take 10 mg by mouth 3 (three) times daily.  01/10/16   Tobie Poet, DO  metoprolol succinate (TOPROL-XL) 25 MG 24 hr tablet Take 1.5 tablets (37.5 mg total) by mouth 2 (two) times daily. 03/22/17   Allie Bossier, MD  sucralfate (CARAFATE) 1 Russell tablet Take 1 tablet (1 Russell total) by mouth 4 (four) times daily. 03/22/17 03/22/18  Allie Bossier, MD   Allergies  Allergen Reactions  . Nsaids Other (See Comments)    Told to avoid all nsaids due to kidney disease   . Tape Other (See Comments)    Welts result, if left for a long amount of time   Family History  Problem Relation Age of Onset  . Obesity Mother        Patient states that family members have no other medical illnesses other than what I have described  . Kidney cancer Maternal Grandmother   . Cancer Father 55       AML  . Heart disease Unknown        No family history   Social History   Socioeconomic History  . Marital status: Single    Spouse name: Not on file  . Number of children: 0  . Years of  education: College  . Highest education level: Not on file  Occupational History  . Occupation: Disabled    Comment: Therapist, occupational  Social Needs  . Financial resource strain: Not on file  . Food insecurity:    Worry: Not on file    Inability: Not on file  . Transportation needs:    Medical: Not on file    Non-medical: Not on file  Tobacco Use  . Smoking status: Never Smoker  . Smokeless tobacco: Never Used  Substance and Sexual Activity  . Alcohol use: Yes    Alcohol/week: 0.0 oz    Comment: 06/29/2016 "might have a few drinks/year"  . Drug use: No  . Sexual activity: Yes  Lifestyle  . Physical activity:    Days per week: Not on file    Minutes per session: Not on file  . Stress: Not on file  Relationships  . Social connections:    Talks on phone: Not on file    Gets together: Not on file    Attends religious service: Not on file    Active member of club or organization: Not on file    Attends meetings of clubs or organizations: Not on file    Relationship status: Not on file  Other Topics Concern  . Not on file  Social History Narrative   Patient currently lives with his fiance   Works with facilities   Nonsmoker, nondrinker   Caffeine use: none   Right handed    Depression screen [facility] 2/9 04/02/2017 08/21/2016 08/17/2016 04/27/2016 04/17/2016  Decreased Interest 0 0 0 0 0  Down, Depressed, Hopeless 0 0 0 0 0  PHQ - 2 Score 0 0 0 0 0  Altered sleeping - - - - -  Tired, decreased energy - - - - -  Change in appetite - - - - -  Feeling bad or failure about yourself  - - - - -  Trouble concentrating - - - - -  Moving slowly or fidgety/restless - - - - -  Suicidal thoughts - - - - -  PHQ-9 Score - - - - -  Difficult doing work/chores - - - - -  Some recent data might be hidden    Review of Systems  Constitutional: Negative for fatigue and unexpected weight change.  Eyes: Negative for visual disturbance.  Respiratory: Negative for cough, chest tightness and  shortness of breath.   Cardiovascular: Negative for chest pain, palpitations and leg swelling.  Gastrointestinal: Negative for abdominal pain and [name] in stool.  Neurological: Negative for dizziness, light-headedness and headaches.       Objective:   Physical Exam  Constitutional: He is oriented to person, place, and time. He appears well-developed and well-nourished. No distress.  HENT:  Head: Normocephalic and atraumatic.  Eyes: Pupils are equal, round, and reactive to light. EOM are normal.  Neck: Neck supple.  Cardiovascular: Normal rate.  Pulmonary/Chest: Effort normal. No respiratory distress.  Musculoskeletal: Normal range of motion.  Neurological: He is alert and oriented to person, place, and time.  Skin: Skin is warm and dry.  Psychiatric: He has a normal mood and affect. His behavior is normal.  Nursing note and vitals reviewed.   BP (!) 183/103   Pulse (!) 116   Temp 98.6 F (37 C)   Resp 16   Ht 6' 2"  (1.88 m)   Wt 256 lb (116.1 kg)   SpO2 95%   BMI 32.87 kg/m      Diabetic Foot Exam - Simple   Simple Foot Form Visual Inspection No deformities, no ulcerations, no other skin breakdown bilaterally:  Yes Sensation Testing See comments:  Yes Pulse Check Posterior Tibialis and Dorsalis pulse intact bilaterally:  Yes Comments Feet numb can't feel me touching the bottom      Assessment & Plan:   1. Uncontrolled diabetes mellitus type 2 with peripheral artery disease (HCC) - hosp for DM - very brittle - not taking insulin as instructed, has not called endo to sched f/u OV yet though instructed to do so several times to which he has agreed (documented by others in chart) but has not - will messaged Dr. Cruzita [name] to see if [place] endo schedulers can reach out to pt since he def needs endo guidance but not putting words into actions. Referred to Dm ed on rec of hosp d/c summary and hospitalists Continue victoza - add in Lantus 15u - per endocrine chart notes  2.  Dysphagia, unspecified type   3. Gastroparesis - refer to GI for further eval. Requests refill on metoclopramide 10 tid (last rx'd as 5 tid 1 mo supply 01/2016. . .)  4. Myalgia - requests flexeril refill - I last rx'd #30, no refills 5 mos prior  5. CIDP (chronic inflammatory demyelinating polyneuropathy) (HCC) - new diagnosis follows Russell/ neuro - [facility]  6. Malignant hypertension - hydralazine 25 tid and toprol XL 25 1.5 tabs (=37.35m) bid  7. Diabetic polyneuropathy associated with type 2 diabetes mellitus ([place]   8. Hypothyroidism (acquired) - managed by endocrine Russell [name] 9. Morbid obesity ([place]   10. [place]orses[place] kidney   11. ESRD on dialysis ([place]   12. Coronary artery disease involving native coronary artery of native heart without angina pectoris   13. Pleural effusion   14. Hypoxemic respiratory failure, chronic ([place]   15. Abnormal CT of the chest - pt thought that the abnml was def the pna he had during hosp so no further f/u was needed but read pt dr. MMatilde Bashc/s note where  he states he suspects it is acute infxn but stlil recommend pt f/u o/p for PET/CT chest scan and then f/u Russell/ pulm Dr. Vaughan Browner o/p OV to discuss results after - pt had both of these sched this week but cancellend/no-showed due to misunderstanding and agrees to reschedule  31. Chronic pain syndrome - I last rx'd pt hydrocodone 4m #60 on 08/17/16 - pt requests refill today  17. Chronic peripheral neuropathic pain - uncontrolled and worsening Russell/ recent diagnosis of CIDP by Russell [name]- happy to address adequate pain management Russell/ pt - rec pt make a return visit dedicated to coming up Russell/ more effective regimen - pt agreeable Last rx'd gabapentin 2016mtid 1 mo supply 3/12 by Dr. EzHorris Latino Pt prefers appt on Tues or Thurs a.m. Since he has dialysis on MWF.  Pt refuses any [name] draws and states anuric - gets labs at dialysis which he will have forwarded on to usKoreaOrders Placed This Encounter  Procedures  . Ambulatory  referral to Gastroenterology    Referral Priority:   Routine    Referral Type:   Consultation    Referral Reason:   Specialty Services Required    Number of Visits Requested:   1  . Ambulatory referral to diabetic education    Referral Priority:   Routine    Referral Type:   Consultation    Referral Reason:   Specialty Services Required    Number of Visits Requested:   1  . HM DIABETES FOOT EXAM    Meds ordered this encounter  Medications  . metoCLOPramide (REGLAN) 5 MG tablet    Sig: Take 1 tablet (5 mg total) by mouth every 8 (eight) hours as needed for nausea or vomiting.    Dispense:  30 tablet    Refill:  0  . HYDROcodone-acetaminophen (NORCO/VICODIN) 5-325 MG tablet    Sig: Take 1-2 tablets by mouth every 4 (four) hours as needed for moderate pain.    Dispense:  30 tablet    Refill:  0  . gabapentin (NEURONTIN) 100 MG capsule    Sig: Take 2 capsules (200 mg total) by mouth 3 (three) times daily.    Dispense:  180 capsule    Refill:  0  . cyclobenzaprine (FLEXERIL) 10 MG tablet    Sig: Take 1 tablet (10 mg total) by mouth 3 (three) times daily as needed for muscle spasms.    Dispense:  30 tablet    Refill:  0   Today I have utilized the [place] Controlled Substance Registry's online query to confirm compliance regarding the patient's controlled medications. My review reveals appropriate prescription fills. Rechecks will occur regularly and the patient is aware of our use of the system. - Ssearches under both [facility] the same - he has not received any controlled medications since I wrote him for hydrocodone 5#60 08/2016 and Dr. AkDarleene Cleaverx'd clonazepam 6m90m60 08/2016  Over 40 min spent in face-to-face evaluation of and consultation with patient and coordination of care.  Over 50% of this time was spent counseling this patient regarding risk/complications of DKA and hyperglycemia, importance of increasing compliance, changing approach, importance of  following Russell/ PCP reg ot coordinate multi-speciality complex care, hypertensive urgency, tachycardia  I personally performed the services described in this documentation, which was scribed in my presence. The recorded information has been reviewed and considered, and addended by me as needed.   EvaDelman [name].D.  Primary Care  at [facility] [address] [place], [place] [address] [phone] phone [phone] fax  04/23/17 4:00 PM

## 2017-04-03 ENCOUNTER — Telehealth: Payer: Self-pay | Admitting: Family Medicine

## 2017-04-03 NOTE — Telephone Encounter (Addendum)
Pt has still not called [facility] to schedule a f/u visit w/ Dr. Elvera LennoxGherghe since he was discharged from the hosp after stay 2/2 DKA 3/18-21.   I think he is confused about his DM medication regimen/use/purpose (pt disagrees - very confident he is doing everything completely correctly. .  .) On chart review, I noted several times in the past 1-2 wks since his hosp for DKA that it has been documented in Epic that pt has assured multiple providers that he will call [facility] to sched f/u encodrine appt when different providers have instructed this yet he has never followed through. At our visit yest, pt said same thing - that he would call and sched f/u endocrine visit.  However, it is now 12d since our appt and still not endocrine visit scheduled.   It doe appear that at least he rescheduled the PET/CT and pulm appt he had no-showed because he misunderstood pulm instructions and thought he was fine and didn't need to f/u on prior abnml test results any longer (during pt's OV w me - I read him his pulmologist's recommendation from the hosp which advised this opposite so rescheduled the missed appts)

## 2017-04-04 DIAGNOSIS — D631 Anemia in chronic kidney disease: Secondary | ICD-10-CM | POA: Diagnosis not present

## 2017-04-04 DIAGNOSIS — N2581 Secondary hyperparathyroidism of renal origin: Secondary | ICD-10-CM | POA: Diagnosis not present

## 2017-04-04 DIAGNOSIS — E1129 Type 2 diabetes mellitus with other diabetic kidney complication: Secondary | ICD-10-CM | POA: Diagnosis not present

## 2017-04-04 DIAGNOSIS — N186 End stage renal disease: Secondary | ICD-10-CM | POA: Diagnosis not present

## 2017-04-04 DIAGNOSIS — D509 Iron deficiency anemia, unspecified: Secondary | ICD-10-CM | POA: Diagnosis not present

## 2017-04-04 NOTE — Telephone Encounter (Signed)
lmtcb x2 for pt. 

## 2017-04-05 NOTE — Telephone Encounter (Signed)
ATC 04/05/17. Unable to leave message, mailbox was full.

## 2017-04-06 ENCOUNTER — Telehealth: Payer: Self-pay

## 2017-04-06 DIAGNOSIS — D509 Iron deficiency anemia, unspecified: Secondary | ICD-10-CM | POA: Diagnosis not present

## 2017-04-06 DIAGNOSIS — D631 Anemia in chronic kidney disease: Secondary | ICD-10-CM | POA: Diagnosis not present

## 2017-04-06 DIAGNOSIS — N2581 Secondary hyperparathyroidism of renal origin: Secondary | ICD-10-CM | POA: Diagnosis not present

## 2017-04-06 DIAGNOSIS — N186 End stage renal disease: Secondary | ICD-10-CM | POA: Diagnosis not present

## 2017-04-06 DIAGNOSIS — E1129 Type 2 diabetes mellitus with other diabetic kidney complication: Secondary | ICD-10-CM | POA: Diagnosis not present

## 2017-04-06 NOTE — Telephone Encounter (Signed)
Spoke with pt who stated to me he called Russell [name] to get the PET scan scheduled but they told him there was no order in the computer.  Looked at orders in pt's chart and I saw where an order was placed for pt to have a PET scan done.  Spoke with Russell [name] [place] [name] to see if she was able to see an order for the PET on her end, and she stated to me an order was in pt's chart. Russell [name] stated to me she was unsure why Russell [name] was unable to see the order when there is one in pt's chart.  Russell printed off the order and stated to me someone would call pt Monday to schedule the PET scan.  Pt's upcoming visit with Russell [name] is 04/24/17 to go over the results of the PET scan.  Called pt back letting him know he will be receiving a call Monday from someone at our office to [name].  Pt expressed understanding. Will route message to pcc pool for them to follow up on Monday.

## 2017-04-06 NOTE — Telephone Encounter (Signed)
Spoke with pt, aware of appt.  Nothing further needed.

## 2017-04-06 NOTE — Telephone Encounter (Signed)
I was able to speak with [name] and he was explaining to me that he did not "no show" , but he was told by the RN that discharged him from the hospital he did not need to follow up with Dr. Isaiah [name].  Therefore, [name] did not come to the appt or [facility]. However, Dr. Clelia CroftShaw told [name] he needed to keep that appointment and the scan.  During this phone conversation, he was instructed to [facility] and get the scan rescheduled and a follow up appt was made for him with Dr. Isaiah [name] for after he has his scan done.  He is rescheduled for April 24, 2017 at 0945 with Dr. Isaiah [name].  Nothing further is needed

## 2017-04-06 NOTE — Telephone Encounter (Signed)
[facility] npo 6 hrs prior no insulin 8 hrs prior

## 2017-04-09 DIAGNOSIS — E1129 Type 2 diabetes mellitus with other diabetic kidney complication: Secondary | ICD-10-CM | POA: Diagnosis not present

## 2017-04-09 DIAGNOSIS — D509 Iron deficiency anemia, unspecified: Secondary | ICD-10-CM | POA: Diagnosis not present

## 2017-04-09 DIAGNOSIS — D631 Anemia in chronic kidney disease: Secondary | ICD-10-CM | POA: Diagnosis not present

## 2017-04-09 DIAGNOSIS — N2581 Secondary hyperparathyroidism of renal origin: Secondary | ICD-10-CM | POA: Diagnosis not present

## 2017-04-09 DIAGNOSIS — N186 End stage renal disease: Secondary | ICD-10-CM | POA: Diagnosis not present

## 2017-04-11 DIAGNOSIS — E1129 Type 2 diabetes mellitus with other diabetic kidney complication: Secondary | ICD-10-CM | POA: Diagnosis not present

## 2017-04-11 DIAGNOSIS — N186 End stage renal disease: Secondary | ICD-10-CM | POA: Diagnosis not present

## 2017-04-11 DIAGNOSIS — D631 Anemia in chronic kidney disease: Secondary | ICD-10-CM | POA: Diagnosis not present

## 2017-04-11 DIAGNOSIS — N2581 Secondary hyperparathyroidism of renal origin: Secondary | ICD-10-CM | POA: Diagnosis not present

## 2017-04-11 DIAGNOSIS — D509 Iron deficiency anemia, unspecified: Secondary | ICD-10-CM | POA: Diagnosis not present

## 2017-04-13 DIAGNOSIS — E1129 Type 2 diabetes mellitus with other diabetic kidney complication: Secondary | ICD-10-CM | POA: Diagnosis not present

## 2017-04-13 DIAGNOSIS — D509 Iron deficiency anemia, unspecified: Secondary | ICD-10-CM | POA: Diagnosis not present

## 2017-04-13 DIAGNOSIS — N186 End stage renal disease: Secondary | ICD-10-CM | POA: Diagnosis not present

## 2017-04-13 DIAGNOSIS — N2581 Secondary hyperparathyroidism of renal origin: Secondary | ICD-10-CM | POA: Diagnosis not present

## 2017-04-13 DIAGNOSIS — D631 Anemia in chronic kidney disease: Secondary | ICD-10-CM | POA: Diagnosis not present

## 2017-04-16 ENCOUNTER — Ambulatory Visit (HOSPITAL_COMMUNITY)
Admission: RE | Admit: 2017-04-16 | Discharge: 2017-04-16 | Disposition: A | Discharge status: Home / Self Care | Payer: Medicare Other | Source: Ambulatory Visit | Attending: Pulmonary Disease | Admitting: Pulmonary Disease

## 2017-04-16 ENCOUNTER — Telehealth: Payer: Self-pay

## 2017-04-16 DIAGNOSIS — I700 Atherosclerosis of aorta: Secondary | ICD-10-CM | POA: Insufficient documentation

## 2017-04-16 DIAGNOSIS — R911 Solitary pulmonary nodule: Secondary | ICD-10-CM | POA: Insufficient documentation

## 2017-04-16 DIAGNOSIS — N186 End stage renal disease: Secondary | ICD-10-CM | POA: Diagnosis not present

## 2017-04-16 DIAGNOSIS — R590 Localized enlarged lymph nodes: Secondary | ICD-10-CM | POA: Insufficient documentation

## 2017-04-16 DIAGNOSIS — R918 Other nonspecific abnormal finding of lung field: Secondary | ICD-10-CM | POA: Diagnosis not present

## 2017-04-16 DIAGNOSIS — E1129 Type 2 diabetes mellitus with other diabetic kidney complication: Secondary | ICD-10-CM | POA: Diagnosis not present

## 2017-04-16 DIAGNOSIS — Q631 Lobulated, fused and horseshoe kidney: Secondary | ICD-10-CM | POA: Insufficient documentation

## 2017-04-16 DIAGNOSIS — D631 Anemia in chronic kidney disease: Secondary | ICD-10-CM | POA: Diagnosis not present

## 2017-04-16 DIAGNOSIS — D509 Iron deficiency anemia, unspecified: Secondary | ICD-10-CM | POA: Diagnosis not present

## 2017-04-16 DIAGNOSIS — N2581 Secondary hyperparathyroidism of renal origin: Secondary | ICD-10-CM | POA: Diagnosis not present

## 2017-04-16 LAB — GLUCOSE, CAPILLARY: GLUCOSE-CAPILLARY: 181 mg/dL — AB (ref 65–99)

## 2017-04-16 MED ORDER — FLUDEOXYGLUCOSE F - 18 (FDG) INJECTION
12.5400 | Freq: Once | INTRAVENOUS | Status: AC | PRN
  Administered 2017-04-16: 12.54 via INTRAVENOUS

## 2017-04-16 NOTE — Telephone Encounter (Signed)
Patient called back. Scheduled FU appt for 4/16 at 3pm. Thanks

## 2017-04-16 NOTE — Telephone Encounter (Signed)
Dear Carley HammedEva, We have tried repeatedly now and in the past to contact the patient to have him come back.  I am not sure if we have the right telephone number but he is not answering his phone.  From my experience with him, he is noncompliant with appointments and the recommended regimen... We will try to contact him again and give him an appointment tomorrow, but again, we have been unsuccessful to get in touch with him so far.  If this happens, I usually let the patients call us, just in case if they are not answering deliberately. Silvestre Mesiristina

## 2017-04-16 NOTE — Telephone Encounter (Signed)
No answer. Called pt to offer appt w/ Dr. Elvera LennoxGherghe tomorrow per PCP need f/u.

## 2017-04-17 ENCOUNTER — Other Ambulatory Visit: Payer: Self-pay | Admitting: *Deleted

## 2017-04-17 ENCOUNTER — Other Ambulatory Visit: Payer: Self-pay | Admitting: Pharmacist

## 2017-04-17 ENCOUNTER — Encounter: Payer: Self-pay | Admitting: Internal Medicine

## 2017-04-17 ENCOUNTER — Telehealth: Payer: Self-pay | Admitting: Internal Medicine

## 2017-04-17 ENCOUNTER — Ambulatory Visit: Payer: Medicare Other | Admitting: Internal Medicine

## 2017-04-17 DIAGNOSIS — H4311 Vitreous hemorrhage, right eye: Secondary | ICD-10-CM | POA: Diagnosis not present

## 2017-04-17 NOTE — Patient Outreach (Signed)
[facility]) Care Management  04/17/2017  Russell BirkenheadSteven B [name] [birth year] [id number]   RN Health Coach Initial Assessment  Referral Date:  03/23/2017 Referral Source:  Hospital Liaison Reason for Referral:  Hospital Discharge/Trouble Affording Medications/Disease Management Education Insurance:  Medicare   Outreach Attempt:  Outreach attempt #1 to patient for initial telephone assessment. No answer. RN Health Coach left HIPAA compliant voicemail message along with contact information.  Plan:  RN Health Coach will send unsuccessful outreach letter to patient.  RN Health Coach will make another outreach attempt to patient within 3-4 business days if no return call back from patient.   Russell LernerFarrah Cerita Rabelo RN [facility] Care Management  RN Health Coach [phone] [name]

## 2017-04-17 NOTE — Telephone Encounter (Signed)
Patient states unable to keep OV for today. Not feeling well. Would like for Dr. Elvera LennoxGherghe to call him at the time of his appt  today to discuss his medications and other issues over the phone. Advised pt he would need to reschedule OV appt. Pt states he has questions about meds and insisted on speaking with the MD. Advised pt to send a MyChart message. Pt agreed and verbalized understanding.

## 2017-04-17 NOTE — Telephone Encounter (Signed)
Spoke to patient. Advised received MyChart message. Asked pt for specific questions to fwd to Dr. Reece AgarG. Pt requesting to know if he can have pen needles and test strips refilled and if it is ok to start basaglar after he finishes with his Lantus.

## 2017-04-17 NOTE — Patient Outreach (Signed)
[facility]) Care Management  04/17/2017  Russell BirkenheadSteven B [name] [birth year] [id number]  Third unsuccessful phone outreach to patient.  No answer and unable to leave message due to recording stating mail box is full.   Outreach letter was mailed to patient with no reply as well.   Plan:  Will update [facility] RN Health Coach Otelia SanteeFarrah on inability to reach patient.   Pharmacy case will be closed at this time.   PCP updated.   Tommye StandardKevin Vanellope Passmore, PharmD, [facility]ACP Clinical Pharmacist [facility] [phone]

## 2017-04-17 NOTE — Telephone Encounter (Signed)
Patient would like to speak to someone to about his prescription. He has an appointment today but is not feeling well. He wants to speak to someone before he cancels his appointment  Please advise

## 2017-04-18 ENCOUNTER — Telehealth: Payer: Self-pay | Admitting: *Deleted

## 2017-04-18 DIAGNOSIS — D509 Iron deficiency anemia, unspecified: Secondary | ICD-10-CM | POA: Diagnosis not present

## 2017-04-18 DIAGNOSIS — N2581 Secondary hyperparathyroidism of renal origin: Secondary | ICD-10-CM | POA: Diagnosis not present

## 2017-04-18 DIAGNOSIS — D631 Anemia in chronic kidney disease: Secondary | ICD-10-CM | POA: Diagnosis not present

## 2017-04-18 DIAGNOSIS — N186 End stage renal disease: Secondary | ICD-10-CM | POA: Diagnosis not present

## 2017-04-18 DIAGNOSIS — E1129 Type 2 diabetes mellitus with other diabetic kidney complication: Secondary | ICD-10-CM | POA: Diagnosis not present

## 2017-04-18 NOTE — Telephone Encounter (Signed)
Faxed signed POC orders back to [facility] at [phone]. Received fax confirmation. Start of care: 02/12/17.

## 2017-04-19 ENCOUNTER — Other Ambulatory Visit: Payer: Self-pay | Admitting: *Deleted

## 2017-04-19 ENCOUNTER — Telehealth: Payer: Self-pay

## 2017-04-19 NOTE — Telephone Encounter (Signed)
Called pt to offer appt opening on 04/22.  Pt accepted and confirmed.

## 2017-04-19 NOTE — Patient Outreach (Signed)
[facility]) Care Russell [name]  04/19/2017  Russell Russell [name] 1970-02-19 [id number]   Russell Russell [name] Initial Assessment  Referral Date:  03/23/2017 Referral Source:  Hospital Liaison Reason for Referral:  Hospital Discharge/Trouble Affording Medications/Disease Russell [name] Education Insurance:  Medicare   Outreach Attempt:  Outreach attempt #2 to patient for initial telephone assessment. No answer and unable to leave voicemail message due to voicemail box being full.  Plan:  Russell Russell [name] will make another outreach attempt to patient within 3-4 business days if no return call back from patient.  Russell LernerFarrah Havanna Groner Russell [name] Russell Russell [name]  Russell Russell [name] [phone] [name]

## 2017-04-20 DIAGNOSIS — N186 End stage renal disease: Secondary | ICD-10-CM | POA: Diagnosis not present

## 2017-04-20 DIAGNOSIS — E1129 Type 2 diabetes mellitus with other diabetic kidney complication: Secondary | ICD-10-CM | POA: Diagnosis not present

## 2017-04-20 DIAGNOSIS — D509 Iron deficiency anemia, unspecified: Secondary | ICD-10-CM | POA: Diagnosis not present

## 2017-04-20 DIAGNOSIS — N2581 Secondary hyperparathyroidism of renal origin: Secondary | ICD-10-CM | POA: Diagnosis not present

## 2017-04-20 DIAGNOSIS — D631 Anemia in chronic kidney disease: Secondary | ICD-10-CM | POA: Diagnosis not present

## 2017-04-23 ENCOUNTER — Ambulatory Visit (INDEPENDENT_AMBULATORY_CARE_PROVIDER_SITE_OTHER): Payer: Medicare Other | Admitting: Internal Medicine

## 2017-04-23 ENCOUNTER — Encounter: Payer: Self-pay | Admitting: Internal Medicine

## 2017-04-23 ENCOUNTER — Telehealth: Payer: Self-pay | Admitting: Neurology

## 2017-04-23 VITALS — BP 164/94 | HR 111 | Ht 74.0 in | Wt 261.4 lb

## 2017-04-23 DIAGNOSIS — E1129 Type 2 diabetes mellitus with other diabetic kidney complication: Secondary | ICD-10-CM | POA: Diagnosis not present

## 2017-04-23 DIAGNOSIS — IMO0002 Reserved for concepts with insufficient information to code with codable children: Secondary | ICD-10-CM

## 2017-04-23 DIAGNOSIS — E1151 Type 2 diabetes mellitus with diabetic peripheral angiopathy without gangrene: Secondary | ICD-10-CM

## 2017-04-23 DIAGNOSIS — N2581 Secondary hyperparathyroidism of renal origin: Secondary | ICD-10-CM | POA: Diagnosis not present

## 2017-04-23 DIAGNOSIS — E039 Hypothyroidism, unspecified: Secondary | ICD-10-CM

## 2017-04-23 DIAGNOSIS — D631 Anemia in chronic kidney disease: Secondary | ICD-10-CM | POA: Diagnosis not present

## 2017-04-23 DIAGNOSIS — I2510 Atherosclerotic heart disease of native coronary artery without angina pectoris: Secondary | ICD-10-CM | POA: Diagnosis not present

## 2017-04-23 DIAGNOSIS — G8929 Other chronic pain: Secondary | ICD-10-CM | POA: Insufficient documentation

## 2017-04-23 DIAGNOSIS — M792 Neuralgia and neuritis, unspecified: Secondary | ICD-10-CM

## 2017-04-23 DIAGNOSIS — E1165 Type 2 diabetes mellitus with hyperglycemia: Secondary | ICD-10-CM | POA: Diagnosis not present

## 2017-04-23 DIAGNOSIS — N186 End stage renal disease: Secondary | ICD-10-CM | POA: Diagnosis not present

## 2017-04-23 DIAGNOSIS — G894 Chronic pain syndrome: Secondary | ICD-10-CM | POA: Insufficient documentation

## 2017-04-23 DIAGNOSIS — D509 Iron deficiency anemia, unspecified: Secondary | ICD-10-CM | POA: Diagnosis not present

## 2017-04-23 LAB — TSH: TSH: 5.25 u[IU]/mL — ABNORMAL HIGH (ref 0.35–4.50)

## 2017-04-23 LAB — T4, FREE: FREE T4: 0.83 ng/dL (ref 0.60–1.60)

## 2017-04-23 LAB — T3, FREE: T3, Free: 3.4 pg/mL (ref 2.3–4.2)

## 2017-04-23 MED ORDER — CYCLOBENZAPRINE HCL 10 MG PO TABS: 10.0000 mg | ORAL_TABLET | Freq: Three times a day (TID) | ORAL | 0 refills | Status: DC | PRN

## 2017-04-23 MED ORDER — METOCLOPRAMIDE HCL 5 MG PO TABS: 5.0000 mg | ORAL_TABLET | Freq: Three times a day (TID) | ORAL | 0 refills | Status: DC | PRN

## 2017-04-23 MED ORDER — HYDROCODONE-ACETAMINOPHEN 5-325 MG PO TABS: 1.0000 | ORAL_TABLET | ORAL | 0 refills | Status: DC | PRN

## 2017-04-23 MED ORDER — SEMAGLUTIDE(0.25 OR 0.5MG/DOS) 2 MG/1.5ML ~~LOC~~ SOPN: 0.5000 mg | PEN_INJECTOR | SUBCUTANEOUS | 5 refills | Status: DC

## 2017-04-23 MED ORDER — INSULIN ASPART 100 UNIT/ML FLEXPEN: 8.0000 [IU] | PEN_INJECTOR | Freq: Three times a day (TID) | SUBCUTANEOUS | 11 refills | Status: AC

## 2017-04-23 MED ORDER — GABAPENTIN 100 MG PO CAPS: 200.0000 mg | ORAL_CAPSULE | Freq: Three times a day (TID) | ORAL | 0 refills | Status: DC

## 2017-04-23 MED ORDER — BASAGLAR KWIKPEN 100 UNIT/ML ~~LOC~~ SOPN: PEN_INJECTOR | SUBCUTANEOUS | 3 refills | Status: DC

## 2017-04-23 NOTE — Telephone Encounter (Signed)
I called the patient.  The patient is getting IVIG which does seem to help, but it wears off within about a week of the next treatment, the patient may need to go up on the frequency to every 3 weeks, we may also need to consider low-dose prednisone and CellCept therapy.  I will get a revisit for him.

## 2017-04-23 NOTE — Telephone Encounter (Signed)
Pt requesting a call to discuss his IVIG treatment, stating he is just wanting to touch base on how its going. Pt feels he is needing a more frequent or a higher dosing.

## 2017-04-23 NOTE — Progress Notes (Signed)
Patient ID: Russell [name], male   DOB: 1970/03/07, 47 y.o.   MRN: [id number]  HPI: Russell [name] is a 47 y.o.-year-old male, returning for f/u for DM2, dx in 2011, insulin-dependent 2014, uncontrolled, with complications (DR, PN, CAD - 3x CABG 2017, ESRD - HD MWF, h/o DKA). Last visit 3 mo ago.  At last visit, he had stopped the insulin completely for 6 months and was only taking Victoza samples.  His sugars were very high, slightly better after he started Victoza.  At last visit, we added back basal insulin and increased the dose of Victoza.  He is on hemodialysis.  Peritoneal dialysis did not work well for him.  Since last visit, he had an episode of DKA on 03/19/2017 after a large volume dialysis, previously, he was hospitalized with pneumonia on 03/01/2017.  Last hemoglobin A1c was: 04/2017: HbA1c 10% Lab Results  Component Value Date   HGBA1C 11.9 01/05/2017   HGBA1C 10.8 (H) 10/14/2016   HGBA1C 12.8 (H) 05/17/2016   At last visit, he was on a regimen of:   - Victoza 1.2 mg daily in am   He is currently on: - Lantus 30 units 2x a day  Ran out of this >> using NovoLog 3x a day before meals Smaller meal: 7-10   Larger meal: 15-20  Pt checks his sugars  1-3 times a day: - am: 147-210 >> 93-140 >> 250-300, 500 >> 180-220 - 2h after b'fast: 250, 266 >> n/c - before lunch: 138, 176-292 >> 162 >> 300s >> 160s - 2h after lunch: 83-330 >> 218, 246 >> n/c - before dinner: 133-234 >> 155-178 >> 200s >> 180-190 - 2h after dinner: 84, 113-288 >> 151, 201, 236 >> n/c - bedtime: 88-199 >> 94 >> n/c - nighttime: n/c Lowest sugar was 80s >> 145 >> 110; he has hypoglycemia awareness in the 70s. Highest sugar was 300s >> 500 >> 300 (did not take insulin with the meal).  Glucometer: [employer], physiological.  Pt's meals are: - Breakfast: eggs, sausage, bacon, toast - Lunch: salad - Dinner: meat + veggies - Snacks: meat, cheese rolls  - He has end-stage renal disease, on HD: Lab Results   Component Value Date   BUN 27 (H) 03/22/2017   CREATININE 6.20 (H) 03/22/2017   -+ HL; last set of lipids: Lab Results  Component Value Date   CHOL 134 03/21/2017   HDL 40 (L) 03/21/2017   LDLCALC 71 03/21/2017   TRIG 113 03/21/2017   CHOLHDL 3.4 03/21/2017  On Lipitor 80.  Previously, Zocor >>  Irritability. - last eye exam was in 12/2016: + DR.  Dr Allyne Gee.He had eye surgery 04/2014. -+ Numbness and tingling in his feet.  On Neurontin.  He has a h/o horseshoe kidney.  He has hypothyroidism: - dx in 2015  Pt is on levothyroxine 50 Mcg daily (restarted at last visit), taken: - not every day - in am - fasting - at least 30 min from b'fast - no Ca, Fe, PPIs - + MVI, after lunch - not on Biotin  Last TSH was high, off levothyroxine: Lab Results  Component Value Date   TSH 6.256 (H) 10/14/2016   Also has HTN.  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   ROS: Constitutional: no weight gain/no weight loss, no fatigue, no subjective hyperthermia, no subjective hypothermia Eyes: no blurry vision, no xerophthalmia ENT: no sore throat, no nodules palpated in throat, no dysphagia, no odynophagia,  no hoarseness Cardiovascular: no CP/no SOB/no palpitations/no leg swelling Respiratory: no cough/no SOB/no wheezing Gastrointestinal: no N/no V/no D/no C/no acid reflux Musculoskeletal: no muscle aches/no joint aches Skin: no rashes, no hair loss Neurological: no tremors/no numbness/no tingling/no dizziness  I reviewed pt's medications, allergies, PMH, social hx, family hx, and changes were documented in the history of present illness. Otherwise, unchanged from my initial visit note.  Past Medical History:  Diagnosis Date  . Anxiety   . Asthma   . 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 Coronary artery disease involving native coronary artery of native heart with unstable angina pectoris (HCC)    80% LAD-95% oD1 bifurcation  lesion & 90% RI --> referred for CABG  . Daily headache   . Depression   . DVT (deep venous thrombosis), H/o 01/2014-on Xarelto 03/24/2014   LLE  . ESRD (end stage renal disease) on dialysis [facility])    "[place], MWF" (06/29/2016)  . Gait abnormality 12/22/2016  . Gastroparesis 12/22/2016  . Hypertension   . Hypothyroidism   . Nephrotic syndrome 05/18/2014  . Neuropathy   . Type 2 diabetes mellitus with diabetic nephropathy [facility])    Past Surgical History:  Procedure Laterality Date  . ANKLE FRACTURE SURGERY Right 1988  . AV FISTULA PLACEMENT Left 01/01/2015   Procedure: CREATION OF LEFT RADIAL CEPHALIC ARTERIOVENOUS (AV) FISTULA ;  Surgeon: Pryor Ochoa, MD;  Location: [facility] OR;  Service: Vascular;  Laterality: Left;  . CAPD REMOVAL N/A 05/19/2016   Procedure: PD CATH REMOVAL;  Surgeon: Abigail Miyamoto, MD;  Location: [facility] OR;  Service: General;  Laterality: N/A;  . CARDIAC CATHETERIZATION N/A 09/22/2015   Procedure: Left Heart Cath and Coronary Angiography;  Surgeon: Iran Ouch, MD;  Location: [facility] INVASIVE CV LAB;  Service: Cardiovascular;  Laterality: N/A;  . CORONARY ARTERY BYPASS GRAFT N/A 09/28/2015   Procedure: CORONARY ARTERY BYPASS GRAFTING (CABG) x 3 UTILIZING LEFT MAMMARY ARTERY AND ENDOSCOPICALLY HARVESTED LEFT GREATER SAPHENOUS VEIN.;  Surgeon: Delight Ovens, MD;  Location: [facility] OR;  Service: Open Heart Surgery;  Laterality: N/A;  . ENDOVEIN HARVEST OF GREATER SAPHENOUS VEIN Left 09/28/2015   Procedure: ENDOVEIN HARVEST OF GREATER SAPHENOUS VEIN;  Surgeon: Delight Ovens, MD;  Location: [facility] OR;  Service: Open Heart Surgery;  Laterality: Left;  . ESOPHAGOGASTRODUODENOSCOPY N/A 03/29/2014   Procedure: ESOPHAGOGASTRODUODENOSCOPY (EGD);  Surgeon: Dorena Cookey, MD;  Location: [facility] ENDOSCOPY;  Service: Endoscopy;  Laterality: N/A;  . EYE SURGERY    . FRACTURE SURGERY    . INSERTION OF DIALYSIS CATHETER Right 01/05/2015   Procedure: INSERTION OF RIGHT INTERNAL JUGULAR DIALYSIS  CATHETER;  Surgeon: Fransisco Hertz, MD;  Location: [facility] OR;  Service: Vascular;  Laterality: Right;  . LEFT HEART CATH AND CORS/GRAFTS ANGIOGRAPHY N/A 09/13/2016   Procedure: LEFT HEART CATH AND CORS/GRAFTS ANGIOGRAPHY;  Surgeon: Kathleene Hazel, MD;  Location: [facility] INVASIVE CV LAB;  Service: Cardiovascular;  Laterality: N/A;  . RETINAL LASER PROCEDURE Bilateral   . RIGHT/LEFT HEART CATH AND CORONARY/GRAFT ANGIOGRAPHY N/A 03/06/2017   Procedure: RIGHT/LEFT HEART CATH AND CORONARY/GRAFT ANGIOGRAPHY;  Surgeon: Marykay Lex, MD;  Location: [facility] INVASIVE CV LAB;  Service: Cardiovascular;  Laterality: N/A;  . TEE WITHOUT CARDIOVERSION N/A 09/28/2015   Procedure: TRANSESOPHAGEAL ECHOCARDIOGRAM (TEE);  Surgeon: Delight Ovens, MD;  Location: [facility] OR;  Service: Open Heart Surgery;  Laterality: N/A;  . TONSILLECTOMY AND ADENOIDECTOMY  1970s   History   Social History  . Marital Status: Single  Spouse Name: N/A  . Number of Children: no   Occupational History  .      Maintenance Curator   Social History Main Topics  . Smoking status: Never Smoker   . Smokeless tobacco: Not on file  . Alcohol Use: No  . Drug Use: No  . Sexual Activity: Yes   Social History Narrative   Patient currently lives with his fiance   Works with facilities   Nonsmoker, nondrinker   Current Outpatient Medications on File Prior to Visit  Medication Sig Dispense Refill  . acetaminophen (TYLENOL) 500 MG tablet Take 1 tablet (500 mg total) by mouth every 6 (six) hours as needed for moderate pain. (Patient taking differently: Take 1,000 mg by mouth every 6 (six) hours as needed (for pain). ) 30 tablet 0  . albuterol (PROVENTIL HFA;VENTOLIN HFA) 108 (90 Base) MCG/ACT inhaler Inhale 1 puff into the lungs every 6 (six) hours as needed for wheezing or shortness of breath. (Patient taking differently: Inhale 2 puffs into the lungs every 8 (eight) hours as needed for wheezing or shortness of breath. ) 1 Inhaler 11  .  aspirin EC 81 MG EC tablet Take 1 tablet (81 mg total) by mouth daily.    Marland Kitchen atorvastatin (LIPITOR) 80 MG tablet Take 1 tablet (80 mg total) by mouth daily at 6 PM. 90 tablet 3  . cinacalcet (SENSIPAR) 30 MG tablet Take 30 mg by mouth every Monday, Wednesday, and Friday.    . cyclobenzaprine (FLEXERIL) 10 MG tablet Take 1 tablet (10 mg total) 3 times/day as needed-between meals & bedtime by mouth for muscle spasms. 30 tablet 0  . ferric gluconate 62.5 mg in sodium chloride 0.9 % 100 mL Inject 62.5 mg into the vein every Wednesday with hemodialysis.    . fluticasone (FLONASE) 50 MCG/ACT nasal spray Place 1 spray into both nostrils daily. 1 g 0  . gabapentin (NEURONTIN) 100 MG capsule Take 2 capsules (200 mg total) by mouth 3 (three) times daily. 180 capsule 0  . hydrALAZINE (APRESOLINE) 25 MG tablet Take 1 tablet (25 mg total) by mouth 3 (three) times daily. 90 tablet 0  . HYDROcodone-acetaminophen (NORCO/VICODIN) 5-325 MG tablet Take 1-2 tablets by mouth every 4 (four) hours as needed for moderate pain. 60 tablet 0  . Immune Globulin, Human, (PRIVIGEN) 40 GM/400ML SOLN Inject 40 g into the vein. 40gm/dayx 5 days and then 40gm q4 weeks    . Insulin Glargine (BASAGLAR KWIKPEN) 100 UNIT/ML SOPN Inject 0.15 mLs (15 Units total) into the skin at bedtime. 10 mL 0  . insulin lispro (HUMALOG) 100 UNIT/ML injection Inject 0.15-0.3 mLs (15-30 Units total) into the skin every evening. Take one unit per 5 carbs depending on dinner. 10 mL 2  . levothyroxine (SYNTHROID, LEVOTHROID) 50 MCG tablet Take 1 tablet (50 mcg total) by mouth daily before breakfast. 45 tablet 3  . liraglutide (VICTOZA) 18 MG/3ML SOPN Inject 0.3 mLs (1.8 mg total) into the skin daily before breakfast. 9 pen 3  . metoCLOPramide (REGLAN) 5 MG tablet Take 1 tablet (5 mg total) by mouth every 8 (eight) hours as needed for nausea or vomiting. (Patient taking differently: Take 10 mg by mouth 3 (three) times daily. ) 30 tablet 0  . metoprolol  succinate (TOPROL-XL) 25 MG 24 hr tablet Take 1.5 tablets (37.5 mg total) by mouth 2 (two) times daily. 60 tablet 0  . sucralfate (CARAFATE) 1 g tablet Take 1 tablet (1 g total) by mouth 4 (four) times  daily. 60 tablet 0   No current facility-administered medications on file prior to visit.    Allergies  Allergen Reactions  . Nsaids Other (See Comments)    Told to avoid all nsaids due to kidney disease   . Tape Other (See Comments)    Welts result, if left for a long amount of time   Family History  Problem Relation Age of Onset  . Obesity Mother        Patient states that family members have no other medical illnesses other than what I have described  . Kidney cancer Maternal Grandmother   . Cancer Father 50       AML  . Heart disease Unknown        No family history   PE: BP (!) 164/94   Pulse (!) 111   Ht 6\' 2"  (1.88 m)   Wt 261 lb 6.4 oz (118.6 kg)   SpO2 95%   BMI 33.56 kg/m  Body mass index is 33.56 kg/m. Wt Readings from Last 3 Encounters:  04/23/17 261 lb 6.4 oz (118.6 kg)  04/02/17 256 lb (116.1 kg)  03/22/17 246 lb 0.5 oz (111.6 kg)   Constitutional: overweight, in NAD Eyes: PERRLA, EOMI, no exophthalmos ENT: moist mucous membranes, no thyromegaly, no cervical lymphadenopathy Cardiovascular: Tachycardia, RR, No MRG Respiratory: CTA B Gastrointestinal: abdomen soft, NT, ND, BS+ Musculoskeletal: no deformities, strength intact in all 4 Skin: moist, warm, no rashes Neurological: no tremor with outstretched hands, DTR normal in all 4  ASSESSMENT: 1. DM2, insulin-dependent, uncontrolled, with complications - CAD, 3x CABG 2017 - DR - PN - nephrotic sd. >> ESRD - HD MWF- [facility] now [place]. [place]inal [place]   2. Hypothyroidism  PLAN:  1. Patient with uncontrolled diabetes, previously on a basal-bolus insulin regimen, however, on basal insulin and Victoza at last visit.  At that time, he was returning after more than 2 years of absence.  He is not  compliant with appointments usually. - We discussed in the past and again today, but with do not have many options for antidiabetic t treatment, because of his kidney disease. - reviewed last hbA1c from earlier this mo: 10% (a little better) - at this visit, he is off Victoza ($) and using Lantus BID and Novolog. However, he did not check with his insurance to see which insulins are covered >> advised to do this and let me know - discussed about starting Ozempic  - given samples - sent Rx to his pharmacy - I hope this is covered. If not, will keep him on the higher Novolog doses, which now he is only using with large meals - I suggested to:  Patient Instructions  Please continue: - Lantus 30 units 2x a day - Novolog Smaller meal: 7-10 units Larger meal: 15-20 units (if you cannot get Ozempic, stay with these doses)  Please check with your insurance if they cover: - Basaglar, Lantus, Levemir, Toujeo, Tresiba (long acting) - Humalog, Novolog, Apidra (rapid acting)  Start Ozempic 0.25 mg weekly x 4 weeks, then increase to 0.5 mg weekly.  Alternatives for Victoza: - Trulicity (once a week) - Ozempic (once a week) - Bydureon ( once a week) - Byetta (2x a day)  Please stop at the lab.  Please return in 3 months with your sugar log.   - continue checking sugars at different times of the day - check 3x a day, rotating checks - advised for yearly eye exams >> he is  UTD - Return to clinic in 3 mo with sugar log    2. Hypothyroidism - latest thyroid labs reviewed with pt (10/2016) >> TSH was high at last visit after he ran out of levothyroxine.  We restarted this at last visit, but he tells me he is seldom taking the dose, maybe 2x a week - will check thyroid tests today: TSH, free T3 and fT4 - if only subclinical hypothyroidism, will keep him off the LT4, but I did explain that hypothyroidism will increase fluid retention - If labs are abnormal, he will need to return for repeat TFTs in 1.5  months  Office Visit on 04/23/2017  Component Date Value Ref Range Status  . TSH 04/23/2017 5.25* 0.35 - 4.50 uIU/mL Final  . Free T4 04/23/2017 0.83  0.60 - 1.60 ng/dL Final   Comment: Specimens from patients who are undergoing biotin therapy and /or ingesting biotin supplements may contain high levels of biotin.  The higher biotin concentration in these specimens interferes with this Free T4 assay.  Specimens that contain high levels  of biotin may cause false high results for this Free T4 assay.  Please interpret results in light of the total clinical presentation of the patient.    . T3, Free 04/23/2017 3.4  2.3 - 4.2 pg/mL Final   TFTs are very close to normal, so for now is not absolutely necessary to start taking levothyroxine consistently.  Carlus Pavlov, MD PhD [facility] Endocrinology

## 2017-04-23 NOTE — Telephone Encounter (Signed)
Called pt. Scheduled OV for 04/23/17 at 1030am, check in no later than 1015am. He verbalized understanding.

## 2017-04-23 NOTE — Patient Instructions (Addendum)
Please continue: - Lantus 30 units 2x a day - Novolog Smaller meal: 7-10 units Larger meal: 15-20 units (if you cannot get Ozempic, stay with these doses)  Please check with your insurance if they cover: - Basaglar, Lantus, Levemir, Toujeo, Tresiba (long acting) - Humalog, Novolog, Apidra (rapid acting)  Start Ozempic 0.25 mg weekly x 4 weeks, then increase to 0.5 mg weekly.  Alternatives for Victoza: - Trulicity (once a week) - Ozempic (once a week) - Bydureon ( once a week) - Byetta (2x a day)  Please stop at the lab.  Please return in 3 months with your sugar log.

## 2017-04-24 ENCOUNTER — Ambulatory Visit: Payer: Medicare Other | Admitting: Pulmonary Disease

## 2017-04-24 ENCOUNTER — Encounter: Payer: Self-pay | Admitting: Neurology

## 2017-04-24 ENCOUNTER — Telehealth: Payer: Self-pay | Admitting: *Deleted

## 2017-04-24 ENCOUNTER — Ambulatory Visit (INDEPENDENT_AMBULATORY_CARE_PROVIDER_SITE_OTHER): Payer: Medicare Other | Admitting: Neurology

## 2017-04-24 ENCOUNTER — Other Ambulatory Visit: Payer: Self-pay | Admitting: *Deleted

## 2017-04-24 VITALS — BP 154/84 | HR 119 | Wt 266.0 lb

## 2017-04-24 DIAGNOSIS — G6181 Chronic inflammatory demyelinating polyneuritis: Secondary | ICD-10-CM | POA: Diagnosis not present

## 2017-04-24 DIAGNOSIS — M21371 Foot drop, right foot: Secondary | ICD-10-CM | POA: Diagnosis not present

## 2017-04-24 DIAGNOSIS — M21372 Foot drop, left foot: Secondary | ICD-10-CM | POA: Diagnosis not present

## 2017-04-24 DIAGNOSIS — I2510 Atherosclerotic heart disease of native coronary artery without angina pectoris: Secondary | ICD-10-CM

## 2017-04-24 MED ORDER — DULOXETINE HCL 30 MG PO CPEP: 30.0000 mg | ORAL_CAPSULE | Freq: Every day | ORAL | 3 refills | Status: DC

## 2017-04-24 MED ORDER — MYCOPHENOLATE MOFETIL 250 MG PO CAPS: 250.0000 mg | ORAL_CAPSULE | Freq: Two times a day (BID) | ORAL | 3 refills | Status: DC

## 2017-04-24 NOTE — Telephone Encounter (Signed)
Notes completed by Dr. Anne HahnWillis.  Faxed and confirmed to number below.

## 2017-04-24 NOTE — Patient Outreach (Signed)
[facility] Care Management  04/24/2017  Fonnie BirkenheadSteven B Sandberg [birth year] [id number]   RN Health Coach Initial Assessment  Referral Date:03/23/2017 Referral Source:Hospital Liaison Reason for Referral:Hospital Discharge/Trouble Affording Medications/Disease Management Education Insurance:Medicare   Outreach Attempt:  Noticed missed call from patient.  RN Health Coach attempted another telephone outreach to patient to complete initial telephone assessment, no answer.  Unable to leave voice message due to mailbox being full.  Plan:  RN Health Coach will close case if no successful return call from patient within 10 day time period from Unsuccessful Letter being mailed to patient.  Rhae LernerFarrah Brendi Mccarroll RN [facility] Care Management  RN Health Coach [phone] [email or web address]

## 2017-04-24 NOTE — Telephone Encounter (Signed)
Called and spoke w/ Delice Bisonara and [facility] at [phone]. Advised Dr. Anne HahnWillis requesting IVIG infusions be every 3 weeks instead of every 4 weeks. She will relay the message but requesting a note faxed to them w/ this request. Fax: [phone]. Advised I will send office note once completed for them. She verbalized understanding.

## 2017-04-24 NOTE — Patient Instructions (Signed)
We will get bilateral AFO for the foot drops, try going to [employer].  We will start Cymbalta 30 mg daily for the neuropathy pain.  Cymbalta (duloxetine) is an antidepressant medication that is commonly used for peripheral neuropathy pain or for fibromyalgia pain. As with any antidepressant medication, worsening depression can be seen. This medication can potentially cause headache, dizziness, sexual dysfunction, or nausea. If any problems are noted on this medication, please contact our office.    We will start Cellcept for the CIDP.   [facility]

## 2017-04-24 NOTE — Telephone Encounter (Signed)
Tried initiating PA mycophenolate on covermymeds. Got the following response: "[facility] [employer] is not able to process this request through ePA, please contact the plan at [phone] or fax in request to [phone]."  Called [facility] [employer] and provided clinical info. PA approved effective 01/24/17-01/01/2038. FA#[id number]#[id number].  Faxed notice of approval to [facility] [place] at [phone]. Received fax confirmation.

## 2017-04-24 NOTE — Patient Outreach (Signed)
[facility] Care Management  04/24/2017  Fonnie BirkenheadSteven B Barnick 08-07-1970 [id number]   RN Health Coach Initial Assessment  Referral Date:03/23/2017 Referral Source:Hospital Liaison Reason for Referral:Hospital Discharge/Trouble Affording Medications/Disease Management Education Insurance:Medicare   Outreach Attempt:  Outreach attempt #3 to patient for initial telephone assessment. No answer and unable to leave voicemail message due to mailbox being full.  Plan:  RN Health Coach will close case if no return call from patient within 10 day time period from Unsuccessful Letter being mailed to patient.  Rhae LernerFarrah Marvalene Barrett RN [facility] Care Management  RN Health Coach [phone] [email or web address]

## 2017-04-24 NOTE — Progress Notes (Signed)
Reason for visit: CIDP  Russell [name] is an 47 y.o. male  History of present illness:  [name] is a 47 year old right-handed white male with a history of diabetes and diabetic peripheral neuropathy, but he also has CIDP.  The patient has gone on IVIG therapy once every 4 weeks, he has noted good benefit with the proximal muscle strength, he still has bilateral foot drops and weakness of the hands that has been persistent.  He may have discomfort in the hands.  He feels that the effects of the IVIG are wearing off within a week of the next therapy.  He does stumble on occasion, he fell yesterday.  The patient occasionally will use a cane or a walking stick for ambulation.  He does not have AFO braces for the foot drops.  The may occasionally have some headache for several days after the IVIG therapy.  He is on gabapentin taking 300 mg 3 times daily for his neuropathy pain without full benefit.  He is on hemodialysis and cannot go higher on the dose.  He returns to the office today for an evaluation.  Past Medical History:  Diagnosis Date  . Anxiety   . Asthma   . 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 Daily headache   . Depression   . DVT (deep venous thrombosis), H/o 01/2014-on Xarelto 03/24/2014   LLE  . ESRD (end stage renal disease) on dialysis [facility](HCC)    "[place], MWF" (06/29/2016)  . Gait abnormality 12/22/2016  . Gastroparesis 12/22/2016  . Hypertension   . Hypothyroidism   . Nephrotic syndrome 05/18/2014  . Neuropathy   . Type 2 diabetes mellitus with diabetic nephropathy [facility](HCC)     Past Surgical History:  Procedure Laterality Date  . ANKLE FRACTURE SURGERY Right 1988  . AV FISTULA PLACEMENT Left 01/01/2015   Procedure: CREATION OF LEFT RADIAL CEPHALIC  ARTERIOVENOUS (AV) FISTULA ;  Surgeon: Pryor OchoaJames D Lawson, MD;  [facility];  Service: Vascular;  Laterality: Left;  . CAPD REMOVAL N/A 05/19/2016   Procedure: PD CATH REMOVAL;  Surgeon: Abigail MiyamotoBlackman, Douglas, MD;  [facility];  Service: General;  Laterality: N/A;  . CARDIAC CATHETERIZATION N/A 09/22/2015   Procedure: Left Heart Cath and Coronary Angiography;  Surgeon: Iran OuchMuhammad A Arida, MD;  [facility]: [facility] INVASIVE CV LAB;  Service: Cardiovascular;  Laterality: N/A;  . CORONARY ARTERY BYPASS GRAFT N/A 09/28/2015   Procedure: CORONARY ARTERY BYPASS GRAFTING (CABG) x 3 UTILIZING LEFT MAMMARY ARTERY AND ENDOSCOPICALLY HARVESTED LEFT GREATER SAPHENOUS VEIN.;  Surgeon: Delight OvensEdward B Gerhardt, MD;  Location: [facility] OR;  Service: Open Heart Surgery;  Laterality: N/A;  . ENDOVEIN HARVEST OF GREATER SAPHENOUS VEIN Left 09/28/2015   Procedure: ENDOVEIN HARVEST OF GREATER SAPHENOUS VEIN;  Surgeon: Delight OvensEdward B Gerhardt, MD;  [facility];  Service: Open Heart Surgery;  Laterality: Left;  . ESOPHAGOGASTRODUODENOSCOPY N/A 03/29/2014   Procedure: ESOPHAGOGASTRODUODENOSCOPY (EGD);  Surgeon: Dorena CookeyJohn Hayes, MD;  [facility];  Service: Endoscopy;  Laterality: N/A;  . EYE SURGERY    . FRACTURE SURGERY    . INSERTION OF DIALYSIS CATHETER Right 01/05/2015   Procedure: INSERTION OF RIGHT INTERNAL JUGULAR DIALYSIS CATHETER;  Surgeon: Fransisco HertzBrian L Chen, MD;  [facility];  Service: Vascular;  Laterality: Right;  . LEFT HEART CATH AND  CORS/GRAFTS ANGIOGRAPHY N/A 09/13/2016   Procedure: LEFT HEART CATH AND CORS/GRAFTS ANGIOGRAPHY;  Surgeon: Kathleene Hazel, MD;  [facility] INVASIVE CV LAB;  Service: Cardiovascular;  Laterality: N/A;  . RETINAL LASER PROCEDURE Bilateral   . RIGHT/LEFT HEART CATH AND CORONARY/GRAFT ANGIOGRAPHY N/A 03/06/2017   Procedure: RIGHT/LEFT HEART CATH AND CORONARY/GRAFT ANGIOGRAPHY;  Surgeon: Marykay Lex, MD;  Location: [facility] INVASIVE CV LAB;  Service: Cardiovascular;  Laterality: N/A;  . TEE WITHOUT  CARDIOVERSION N/A 09/28/2015   Procedure: TRANSESOPHAGEAL ECHOCARDIOGRAM (TEE);  Surgeon: Delight Ovens, MD;  Location: [facility] OR;  Service: Open Heart Surgery;  Laterality: N/A;  . TONSILLECTOMY AND ADENOIDECTOMY  1970s    Family History  Problem Relation Age of Onset  . Obesity Mother        Patient states that family members have no other medical illnesses other than what I have described  . Kidney cancer Maternal Grandmother   . Cancer Father 50       AML  . Heart disease Unknown        No family history    Social history:  reports that he has never smoked. He has never used smokeless tobacco. He reports that he drinks alcohol. He reports that he does not use drugs.    Allergies  Allergen Reactions  . Nsaids Other (See Comments)    Told to avoid all nsaids due to kidney disease   . Tape Other (See Comments)    Welts result, if left for a long amount of time    Medications:  Prior to Admission medications   Medication Sig Start Date End Date Taking? Authorizing Provider  acetaminophen (TYLENOL) 500 MG tablet Take 1 tablet (500 mg total) by mouth every 6 (six) hours as needed for moderate pain. Patient taking differently: Take 1,000 mg by mouth every 6 (six) hours as needed (for pain).  09/29/14  Yes Albertine Grates, MD  albuterol (PROVENTIL HFA;VENTOLIN HFA) 108 (90 Base) MCG/ACT inhaler Inhale 1 puff into the lungs every 6 (six) hours as needed for wheezing or shortness of breath. Patient taking differently: Inhale 2 puffs into the lungs every 8 (eight) hours as needed for wheezing or shortness of breath.  03/11/15  Yes [name], MD  aspirin EC 81 MG EC tablet Take 1 tablet (81 mg total) by mouth daily. 10/06/15  Yes Barrett, Erin R, PA-C  atorvastatin (LIPITOR) 80 MG tablet Take 1 tablet (80 mg total) by mouth daily at 6 PM. 01/05/17  Yes Azalee Course, PA  cinacalcet (SENSIPAR) 30 MG tablet Take 30 mg by mouth every Monday, Wednesday, and Friday.   Yes [provider]    cyclobenzaprine (FLEXERIL) 10 MG tablet Take 1 tablet (10 mg total) by mouth 3 (three) times daily as needed for muscle spasms. 04/23/17  Yes [name], MD  ferric gluconate 62.5 mg in sodium chloride 0.9 % 100 mL Inject 62.5 mg into the vein every Wednesday with hemodialysis. 03/14/17  Yes Briant Cedar, MD  fluticasone (FLONASE) 50 MCG/ACT nasal spray Place 1 spray into both nostrils daily. 03/14/17  Yes Briant Cedar, MD  gabapentin (NEURONTIN) 100 MG capsule Take 2 capsules (200 mg total) by mouth 3 (three) times daily. 04/23/17 05/23/17 Yes [name], MD  hydrALAZINE (APRESOLINE) 25 MG tablet Take 1 tablet (25 mg total) by mouth 3 (three) times daily. 03/22/17  Yes Drema Dallas, MD  HYDROcodone-acetaminophen (NORCO/VICODIN) 5-325 MG tablet Take 1-2 tablets by mouth every 4 (four)  hours as needed for moderate pain. 04/23/17  Yes [name], MD  Immune Globulin, Human, (PRIVIGEN) 40 GM/400ML SOLN Inject 40 g into the vein. 40gm/dayx 5 days and then 40gm q4 weeks   Yes [provider]  insulin aspart (NOVOLOG FLEXPEN) 100 UNIT/ML FlexPen Inject 8-20 Units into the skin 3 (three) times daily with meals. 04/23/17  Yes Carlus Pavlov, MD  Insulin Glargine (BASAGLAR KWIKPEN) 100 UNIT/ML SOPN Inject 30 units 2x a day under skin 04/23/17  Yes Carlus Pavlov, MD  levothyroxine (SYNTHROID, LEVOTHROID) 50 MCG tablet Take 1 tablet (50 mcg total) by mouth daily before breakfast. 01/05/17  Yes Carlus Pavlov, MD  metoCLOPramide (REGLAN) 5 MG tablet Take 1 tablet (5 mg total) by mouth every 8 (eight) hours as needed for nausea or vomiting. 04/23/17  Yes [name], MD  metoprolol succinate (TOPROL-XL) 25 MG 24 hr tablet Take 1.5 tablets (37.5 mg total) by mouth 2 (two) times daily. 03/22/17  Yes Drema Dallas, MD  Semaglutide [facility]) 0.25 or 0.5 MG/DOSE SOPN Inject 0.5 mg into the skin once a week. 04/23/17  Yes Carlus Pavlov, MD  sucralfate (CARAFATE) 1 g tablet Take 1 tablet  (1 g total) by mouth 4 (four) times daily. 03/22/17 03/22/18 Yes Drema Dallas, MD  DULoxetine (CYMBALTA) 30 MG capsule Take 1 capsule (30 mg total) by mouth daily. 04/24/17   York Spaniel, MD  mycophenolate (CELLCEPT) 250 MG capsule Take 1 capsule (250 mg total) by mouth 2 (two) times daily. 04/24/17   York Spaniel, MD    ROS:  Out of a complete 14 system review of symptoms, the patient complains only of the following symptoms, and all other reviewed systems are negative.  Hand weakness, gait instability Numbness, pain  Blood pressure (!) 154/84, pulse (!) 119, weight 266 lb (120.7 kg).  Physical Exam  General: The patient is alert and cooperative at the time of the examination.  The patient is moderately obese.  Skin: 1+ edema below the knees is seen bilaterally.   Neurologic Exam  Mental status: The patient is alert and oriented x 3 at the time of the examination. The patient has apparent normal recent and remote memory, with an apparently normal attention span and concentration ability.   Cranial nerves: Facial symmetry is present. Speech is normal, no aphasia or dysarthria is noted. Extraocular movements are full. Visual fields are full.  Motor: The patient has good strength in all 4 extremities, with exception of 4/5 strength to grip, significant weakness with intrinsic muscles of the hands bilaterally, atrophy of the interossei are noted.  The patient has bilateral foot drops, left greater than right.  At this point proximal muscle weakness of the arms and legs is essentially normal.  Sensory examination: Soft touch sensation is symmetric on the face, arms, and legs.  Coordination: The patient has good finger-nose-finger and heel-to-shin bilaterally.  Gait and station: The patient has a wide-based, steppage gait pattern bilaterally.  Tandem gait was not attempted.  Romberg is negative but is slightly unsteady  Reflexes: Deep tendon reflexes are symmetric, but are  absent throughout.   Assessment/Plan:  1.  CIDP  2.  Diabetic peripheral neuropathy  3.  Bilateral foot drops  4.  Gait instability  The patient has gained good benefit with the IVIG therapy, his proximal muscle weakness has returned to normal.  He has residual distal weakness that likely is related to a diabetic axonal peripheral neuropathy.  It is unlikely that  the IVIG therapy will improve his foot drops or intrinsic muscle strength of the hands.  The patient does have a wearing off effect before the next dose of IVIG, however.  We will increase the cycling times every 3 weeks.  The patient will have CellCept added to the regimen taking 250 mg twice daily.  We will not add prednisone given the fact that he is diabetic.  The patient will be placed on Cymbalta for diabetic nerve pain, he will have 30 mg twice daily.  He gets his IVIG through [employer].  We will get physical and occupational therapy started.  He will follow-up in 6 months, he will have blood work done in 3 months.  Marlan Palau MD 04/24/2017 10:47 AM  [facility] [address] [place], [place] [address]  Phone [phone] Fax [phone]

## 2017-04-25 DIAGNOSIS — N186 End stage renal disease: Secondary | ICD-10-CM | POA: Diagnosis not present

## 2017-04-25 DIAGNOSIS — E1129 Type 2 diabetes mellitus with other diabetic kidney complication: Secondary | ICD-10-CM | POA: Diagnosis not present

## 2017-04-25 DIAGNOSIS — D631 Anemia in chronic kidney disease: Secondary | ICD-10-CM | POA: Diagnosis not present

## 2017-04-25 DIAGNOSIS — N2581 Secondary hyperparathyroidism of renal origin: Secondary | ICD-10-CM | POA: Diagnosis not present

## 2017-04-25 DIAGNOSIS — D509 Iron deficiency anemia, unspecified: Secondary | ICD-10-CM | POA: Diagnosis not present

## 2017-04-25 NOTE — Telephone Encounter (Signed)
Sent order for IVIG Telephone [phone] - fax [phone]. [facility] Received ok conformation.

## 2017-04-26 DIAGNOSIS — H4311 Vitreous hemorrhage, right eye: Secondary | ICD-10-CM | POA: Diagnosis not present

## 2017-04-26 DIAGNOSIS — E113591 Type 2 diabetes mellitus with proliferative diabetic retinopathy without macular edema, right eye: Secondary | ICD-10-CM | POA: Diagnosis not present

## 2017-04-27 DIAGNOSIS — E113511 Type 2 diabetes mellitus with proliferative diabetic retinopathy with macular edema, right eye: Secondary | ICD-10-CM | POA: Diagnosis not present

## 2017-04-28 DIAGNOSIS — D509 Iron deficiency anemia, unspecified: Secondary | ICD-10-CM | POA: Diagnosis not present

## 2017-04-28 DIAGNOSIS — N2581 Secondary hyperparathyroidism of renal origin: Secondary | ICD-10-CM | POA: Diagnosis not present

## 2017-04-28 DIAGNOSIS — E1129 Type 2 diabetes mellitus with other diabetic kidney complication: Secondary | ICD-10-CM | POA: Diagnosis not present

## 2017-04-28 DIAGNOSIS — N186 End stage renal disease: Secondary | ICD-10-CM | POA: Diagnosis not present

## 2017-04-28 DIAGNOSIS — D631 Anemia in chronic kidney disease: Secondary | ICD-10-CM | POA: Diagnosis not present

## 2017-04-30 ENCOUNTER — Other Ambulatory Visit: Payer: Self-pay

## 2017-04-30 DIAGNOSIS — D509 Iron deficiency anemia, unspecified: Secondary | ICD-10-CM | POA: Diagnosis not present

## 2017-04-30 DIAGNOSIS — E1129 Type 2 diabetes mellitus with other diabetic kidney complication: Secondary | ICD-10-CM | POA: Diagnosis not present

## 2017-04-30 DIAGNOSIS — N2581 Secondary hyperparathyroidism of renal origin: Secondary | ICD-10-CM | POA: Diagnosis not present

## 2017-04-30 DIAGNOSIS — D631 Anemia in chronic kidney disease: Secondary | ICD-10-CM | POA: Diagnosis not present

## 2017-04-30 DIAGNOSIS — N186 End stage renal disease: Secondary | ICD-10-CM | POA: Diagnosis not present

## 2017-04-30 NOTE — Patient Outreach (Signed)
[facility]) Care Management  04/30/2017  Russell [name] [birth year] [id number]   Patient has not responded to calls or letters.  Plan:  RN Health Coach will close the case due to inability to contact the patient.  Juanell Fairly RN, BSN, [facility] RN Health Coach Disease Management [facility] Dial:  [phone]  Fax: [phone]

## 2017-05-02 DIAGNOSIS — E8770 Fluid overload, unspecified: Secondary | ICD-10-CM | POA: Diagnosis not present

## 2017-05-02 DIAGNOSIS — E1129 Type 2 diabetes mellitus with other diabetic kidney complication: Secondary | ICD-10-CM | POA: Diagnosis not present

## 2017-05-02 DIAGNOSIS — D631 Anemia in chronic kidney disease: Secondary | ICD-10-CM | POA: Diagnosis not present

## 2017-05-02 DIAGNOSIS — N2581 Secondary hyperparathyroidism of renal origin: Secondary | ICD-10-CM | POA: Diagnosis not present

## 2017-05-02 DIAGNOSIS — N186 End stage renal disease: Secondary | ICD-10-CM | POA: Diagnosis not present

## 2017-05-02 DIAGNOSIS — Z992 Dependence on renal dialysis: Secondary | ICD-10-CM | POA: Diagnosis not present

## 2017-05-02 DIAGNOSIS — N049 Nephrotic syndrome with unspecified morphologic changes: Secondary | ICD-10-CM | POA: Diagnosis not present

## 2017-05-03 DIAGNOSIS — N2581 Secondary hyperparathyroidism of renal origin: Secondary | ICD-10-CM | POA: Diagnosis not present

## 2017-05-03 DIAGNOSIS — N186 End stage renal disease: Secondary | ICD-10-CM | POA: Diagnosis not present

## 2017-05-03 DIAGNOSIS — E8770 Fluid overload, unspecified: Secondary | ICD-10-CM | POA: Diagnosis not present

## 2017-05-04 DIAGNOSIS — N186 End stage renal disease: Secondary | ICD-10-CM | POA: Diagnosis not present

## 2017-05-04 DIAGNOSIS — N2581 Secondary hyperparathyroidism of renal origin: Secondary | ICD-10-CM | POA: Diagnosis not present

## 2017-05-04 DIAGNOSIS — D631 Anemia in chronic kidney disease: Secondary | ICD-10-CM | POA: Diagnosis not present

## 2017-05-04 DIAGNOSIS — H3582 Retinal ischemia: Secondary | ICD-10-CM | POA: Diagnosis not present

## 2017-05-04 DIAGNOSIS — E1129 Type 2 diabetes mellitus with other diabetic kidney complication: Secondary | ICD-10-CM | POA: Diagnosis not present

## 2017-05-04 DIAGNOSIS — E113593 Type 2 diabetes mellitus with proliferative diabetic retinopathy without macular edema, bilateral: Secondary | ICD-10-CM | POA: Diagnosis not present

## 2017-05-07 ENCOUNTER — Encounter: Payer: Self-pay | Admitting: Gastroenterology

## 2017-05-07 DIAGNOSIS — N2581 Secondary hyperparathyroidism of renal origin: Secondary | ICD-10-CM | POA: Diagnosis not present

## 2017-05-07 DIAGNOSIS — N186 End stage renal disease: Secondary | ICD-10-CM | POA: Diagnosis not present

## 2017-05-07 DIAGNOSIS — D631 Anemia in chronic kidney disease: Secondary | ICD-10-CM | POA: Diagnosis not present

## 2017-05-07 DIAGNOSIS — E1129 Type 2 diabetes mellitus with other diabetic kidney complication: Secondary | ICD-10-CM | POA: Diagnosis not present

## 2017-05-09 DIAGNOSIS — E1129 Type 2 diabetes mellitus with other diabetic kidney complication: Secondary | ICD-10-CM | POA: Diagnosis not present

## 2017-05-09 DIAGNOSIS — D631 Anemia in chronic kidney disease: Secondary | ICD-10-CM | POA: Diagnosis not present

## 2017-05-09 DIAGNOSIS — N186 End stage renal disease: Secondary | ICD-10-CM | POA: Diagnosis not present

## 2017-05-09 DIAGNOSIS — N2581 Secondary hyperparathyroidism of renal origin: Secondary | ICD-10-CM | POA: Diagnosis not present

## 2017-05-11 DIAGNOSIS — D631 Anemia in chronic kidney disease: Secondary | ICD-10-CM | POA: Diagnosis not present

## 2017-05-11 DIAGNOSIS — N2581 Secondary hyperparathyroidism of renal origin: Secondary | ICD-10-CM | POA: Diagnosis not present

## 2017-05-11 DIAGNOSIS — N186 End stage renal disease: Secondary | ICD-10-CM | POA: Diagnosis not present

## 2017-05-11 DIAGNOSIS — E1129 Type 2 diabetes mellitus with other diabetic kidney complication: Secondary | ICD-10-CM | POA: Diagnosis not present

## 2017-05-11 NOTE — Telephone Encounter (Signed)
Faxed signed order back to [facility] re: changing privigen IV infusions from q4 weeks to q3weeks. Order date: 04/25/17. Fax: [phone]. Received fax confirmation.

## 2017-05-14 DIAGNOSIS — E1129 Type 2 diabetes mellitus with other diabetic kidney complication: Secondary | ICD-10-CM | POA: Diagnosis not present

## 2017-05-14 DIAGNOSIS — D631 Anemia in chronic kidney disease: Secondary | ICD-10-CM | POA: Diagnosis not present

## 2017-05-14 DIAGNOSIS — N186 End stage renal disease: Secondary | ICD-10-CM | POA: Diagnosis not present

## 2017-05-14 DIAGNOSIS — N2581 Secondary hyperparathyroidism of renal origin: Secondary | ICD-10-CM | POA: Diagnosis not present

## 2017-05-16 DIAGNOSIS — E1129 Type 2 diabetes mellitus with other diabetic kidney complication: Secondary | ICD-10-CM | POA: Diagnosis not present

## 2017-05-16 DIAGNOSIS — N186 End stage renal disease: Secondary | ICD-10-CM | POA: Diagnosis not present

## 2017-05-16 DIAGNOSIS — D631 Anemia in chronic kidney disease: Secondary | ICD-10-CM | POA: Diagnosis not present

## 2017-05-16 DIAGNOSIS — N2581 Secondary hyperparathyroidism of renal origin: Secondary | ICD-10-CM | POA: Diagnosis not present

## 2017-05-18 DIAGNOSIS — E1129 Type 2 diabetes mellitus with other diabetic kidney complication: Secondary | ICD-10-CM | POA: Diagnosis not present

## 2017-05-18 DIAGNOSIS — D631 Anemia in chronic kidney disease: Secondary | ICD-10-CM | POA: Diagnosis not present

## 2017-05-18 DIAGNOSIS — N186 End stage renal disease: Secondary | ICD-10-CM | POA: Diagnosis not present

## 2017-05-18 DIAGNOSIS — N2581 Secondary hyperparathyroidism of renal origin: Secondary | ICD-10-CM | POA: Diagnosis not present

## 2017-05-20 ENCOUNTER — Other Ambulatory Visit: Payer: Self-pay

## 2017-05-20 ENCOUNTER — Emergency Department (HOSPITAL_COMMUNITY): Payer: Medicare Other

## 2017-05-20 ENCOUNTER — Encounter (HOSPITAL_COMMUNITY): Payer: Self-pay | Admitting: Emergency Medicine

## 2017-05-20 ENCOUNTER — Inpatient Hospital Stay (HOSPITAL_COMMUNITY)
Admission: EM | Admit: 2017-05-20 | Discharge: 2017-05-25 | DRG: 291 | Disposition: A | Discharge status: Home Health | Payer: Medicare Other | Attending: Student in an Organized Health Care Education/Training Program | Admitting: Student in an Organized Health Care Education/Training Program

## 2017-05-20 DIAGNOSIS — E039 Hypothyroidism, unspecified: Secondary | ICD-10-CM | POA: Diagnosis present

## 2017-05-20 DIAGNOSIS — R7309 Other abnormal glucose: Secondary | ICD-10-CM | POA: Diagnosis not present

## 2017-05-20 DIAGNOSIS — D62 Acute posthemorrhagic anemia: Secondary | ICD-10-CM | POA: Diagnosis present

## 2017-05-20 DIAGNOSIS — N2581 Secondary hyperparathyroidism of renal origin: Secondary | ICD-10-CM | POA: Diagnosis present

## 2017-05-20 DIAGNOSIS — R112 Nausea with vomiting, unspecified: Secondary | ICD-10-CM | POA: Diagnosis not present

## 2017-05-20 DIAGNOSIS — F419 Anxiety disorder, unspecified: Secondary | ICD-10-CM | POA: Diagnosis present

## 2017-05-20 DIAGNOSIS — E02 Subclinical iodine-deficiency hypothyroidism: Secondary | ICD-10-CM | POA: Diagnosis not present

## 2017-05-20 DIAGNOSIS — Z79890 Hormone replacement therapy (postmenopausal): Secondary | ICD-10-CM | POA: Diagnosis not present

## 2017-05-20 DIAGNOSIS — I248 Other forms of acute ischemic heart disease: Secondary | ICD-10-CM | POA: Diagnosis present

## 2017-05-20 DIAGNOSIS — I5023 Acute on chronic systolic (congestive) heart failure: Secondary | ICD-10-CM | POA: Diagnosis present

## 2017-05-20 DIAGNOSIS — Z951 Presence of aortocoronary bypass graft: Secondary | ICD-10-CM | POA: Diagnosis not present

## 2017-05-20 DIAGNOSIS — J9601 Acute respiratory failure with hypoxia: Secondary | ICD-10-CM | POA: Diagnosis not present

## 2017-05-20 DIAGNOSIS — R1314 Dysphagia, pharyngoesophageal phase: Secondary | ICD-10-CM | POA: Diagnosis present

## 2017-05-20 DIAGNOSIS — D638 Anemia in other chronic diseases classified elsewhere: Secondary | ICD-10-CM

## 2017-05-20 DIAGNOSIS — Z992 Dependence on renal dialysis: Secondary | ICD-10-CM

## 2017-05-20 DIAGNOSIS — Z8249 Family history of ischemic heart disease and other diseases of the circulatory system: Secondary | ICD-10-CM

## 2017-05-20 DIAGNOSIS — R9431 Abnormal electrocardiogram [ECG] [EKG]: Secondary | ICD-10-CM

## 2017-05-20 DIAGNOSIS — M792 Neuralgia and neuritis, unspecified: Secondary | ICD-10-CM | POA: Diagnosis not present

## 2017-05-20 DIAGNOSIS — E8770 Fluid overload, unspecified: Secondary | ICD-10-CM | POA: Diagnosis present

## 2017-05-20 DIAGNOSIS — G8929 Other chronic pain: Secondary | ICD-10-CM | POA: Diagnosis present

## 2017-05-20 DIAGNOSIS — E669 Obesity, unspecified: Secondary | ICD-10-CM | POA: Diagnosis present

## 2017-05-20 DIAGNOSIS — F329 Major depressive disorder, single episode, unspecified: Secondary | ICD-10-CM | POA: Diagnosis present

## 2017-05-20 DIAGNOSIS — N186 End stage renal disease: Secondary | ICD-10-CM | POA: Diagnosis not present

## 2017-05-20 DIAGNOSIS — R1310 Dysphagia, unspecified: Secondary | ICD-10-CM

## 2017-05-20 DIAGNOSIS — G6181 Chronic inflammatory demyelinating polyneuritis: Secondary | ICD-10-CM | POA: Diagnosis present

## 2017-05-20 DIAGNOSIS — E1122 Type 2 diabetes mellitus with diabetic chronic kidney disease: Secondary | ICD-10-CM | POA: Diagnosis not present

## 2017-05-20 DIAGNOSIS — R7989 Other specified abnormal findings of blood chemistry: Secondary | ICD-10-CM | POA: Diagnosis not present

## 2017-05-20 DIAGNOSIS — E875 Hyperkalemia: Secondary | ICD-10-CM | POA: Diagnosis not present

## 2017-05-20 DIAGNOSIS — I2510 Atherosclerotic heart disease of native coronary artery without angina pectoris: Secondary | ICD-10-CM | POA: Diagnosis not present

## 2017-05-20 DIAGNOSIS — I120 Hypertensive chronic kidney disease with stage 5 chronic kidney disease or end stage renal disease: Secondary | ICD-10-CM | POA: Diagnosis not present

## 2017-05-20 DIAGNOSIS — Z7982 Long term (current) use of aspirin: Secondary | ICD-10-CM | POA: Diagnosis not present

## 2017-05-20 DIAGNOSIS — L299 Pruritus, unspecified: Secondary | ICD-10-CM | POA: Diagnosis present

## 2017-05-20 DIAGNOSIS — I5020 Unspecified systolic (congestive) heart failure: Secondary | ICD-10-CM | POA: Diagnosis not present

## 2017-05-20 DIAGNOSIS — E1143 Type 2 diabetes mellitus with diabetic autonomic (poly)neuropathy: Secondary | ICD-10-CM | POA: Diagnosis not present

## 2017-05-20 DIAGNOSIS — K3184 Gastroparesis: Secondary | ICD-10-CM | POA: Diagnosis present

## 2017-05-20 DIAGNOSIS — I252 Old myocardial infarction: Secondary | ICD-10-CM | POA: Diagnosis not present

## 2017-05-20 DIAGNOSIS — E1142 Type 2 diabetes mellitus with diabetic polyneuropathy: Secondary | ICD-10-CM | POA: Diagnosis not present

## 2017-05-20 DIAGNOSIS — J811 Chronic pulmonary edema: Secondary | ICD-10-CM | POA: Diagnosis not present

## 2017-05-20 DIAGNOSIS — I132 Hypertensive heart and chronic kidney disease with heart failure and with stage 5 chronic kidney disease, or end stage renal disease: Principal | ICD-10-CM | POA: Diagnosis present

## 2017-05-20 DIAGNOSIS — R739 Hyperglycemia, unspecified: Secondary | ICD-10-CM

## 2017-05-20 DIAGNOSIS — E1121 Type 2 diabetes mellitus with diabetic nephropathy: Secondary | ICD-10-CM | POA: Diagnosis present

## 2017-05-20 DIAGNOSIS — Z86718 Personal history of other venous thrombosis and embolism: Secondary | ICD-10-CM | POA: Diagnosis not present

## 2017-05-20 DIAGNOSIS — I10 Essential (primary) hypertension: Secondary | ICD-10-CM | POA: Diagnosis not present

## 2017-05-20 DIAGNOSIS — R0602 Shortness of breath: Secondary | ICD-10-CM | POA: Diagnosis not present

## 2017-05-20 DIAGNOSIS — R0989 Other specified symptoms and signs involving the circulatory and respiratory systems: Secondary | ICD-10-CM | POA: Diagnosis not present

## 2017-05-20 DIAGNOSIS — K210 Gastro-esophageal reflux disease with esophagitis: Secondary | ICD-10-CM | POA: Diagnosis not present

## 2017-05-20 DIAGNOSIS — Z7951 Long term (current) use of inhaled steroids: Secondary | ICD-10-CM | POA: Diagnosis not present

## 2017-05-20 DIAGNOSIS — D631 Anemia in chronic kidney disease: Secondary | ICD-10-CM | POA: Diagnosis not present

## 2017-05-20 DIAGNOSIS — I447 Left bundle-branch block, unspecified: Secondary | ICD-10-CM | POA: Diagnosis not present

## 2017-05-20 DIAGNOSIS — E1165 Type 2 diabetes mellitus with hyperglycemia: Secondary | ICD-10-CM | POA: Diagnosis present

## 2017-05-20 DIAGNOSIS — Z79899 Other long term (current) drug therapy: Secondary | ICD-10-CM

## 2017-05-20 DIAGNOSIS — J45909 Unspecified asthma, uncomplicated: Secondary | ICD-10-CM | POA: Diagnosis present

## 2017-05-20 DIAGNOSIS — Z8051 Family history of malignant neoplasm of kidney: Secondary | ICD-10-CM | POA: Diagnosis not present

## 2017-05-20 DIAGNOSIS — H04123 Dry eye syndrome of bilateral lacrimal glands: Secondary | ICD-10-CM | POA: Diagnosis not present

## 2017-05-20 DIAGNOSIS — Z806 Family history of leukemia: Secondary | ICD-10-CM | POA: Diagnosis not present

## 2017-05-20 DIAGNOSIS — Z91048 Other nonmedicinal substance allergy status: Secondary | ICD-10-CM | POA: Diagnosis not present

## 2017-05-20 DIAGNOSIS — E1169 Type 2 diabetes mellitus with other specified complication: Secondary | ICD-10-CM | POA: Diagnosis not present

## 2017-05-20 DIAGNOSIS — Z794 Long term (current) use of insulin: Secondary | ICD-10-CM

## 2017-05-20 DIAGNOSIS — Z9981 Dependence on supplemental oxygen: Secondary | ICD-10-CM | POA: Diagnosis not present

## 2017-05-20 DIAGNOSIS — I1311 Hypertensive heart and chronic kidney disease without heart failure, with stage 5 chronic kidney disease, or end stage renal disease: Secondary | ICD-10-CM | POA: Diagnosis present

## 2017-05-20 DIAGNOSIS — R531 Weakness: Secondary | ICD-10-CM | POA: Diagnosis not present

## 2017-05-20 DIAGNOSIS — Z886 Allergy status to analgesic agent status: Secondary | ICD-10-CM | POA: Diagnosis not present

## 2017-05-20 DIAGNOSIS — R5381 Other malaise: Secondary | ICD-10-CM | POA: Diagnosis present

## 2017-05-20 DIAGNOSIS — R269 Unspecified abnormalities of gait and mobility: Secondary | ICD-10-CM | POA: Diagnosis present

## 2017-05-20 LAB — COMPREHENSIVE METABOLIC PANEL
ALT: 18 U/L (ref 17–63)
AST: 20 U/L (ref 15–41)
Albumin: 4 g/dL (ref 3.5–5.0)
Alkaline Phosphatase: 77 U/L (ref 38–126)
Anion gap: 20 — ABNORMAL HIGH (ref 5–15)
BUN: 49 mg/dL — ABNORMAL HIGH (ref 6–20)
CO2: 25 mmol/L (ref 22–32)
Calcium: 9.2 mg/dL (ref 8.9–10.3)
Chloride: 87 mmol/L — ABNORMAL LOW (ref 101–111)
Creatinine, Ser: 10.01 mg/dL — ABNORMAL HIGH (ref 0.61–1.24)
GFR calc Af Amer: 6 mL/min — ABNORMAL LOW (ref >60)
GFR calc non Af Amer: 5 mL/min — ABNORMAL LOW (ref >60)
Glucose, Bld: 426 mg/dL — ABNORMAL HIGH (ref 65–99)
Potassium: 5.5 mmol/L — ABNORMAL HIGH (ref 3.5–5.1)
Sodium: 132 mmol/L — ABNORMAL LOW (ref 135–145)
Total Bilirubin: 1.1 mg/dL (ref 0.3–1.2)
Total Protein: 7.3 g/dL (ref 6.5–8.1)

## 2017-05-20 LAB — BETA-HYDROXYBUTYRIC ACID: Beta-Hydroxybutyric Acid: 1.48 mmol/L — ABNORMAL HIGH (ref 0.05–0.27)

## 2017-05-20 LAB — LIPASE, BLOOD: Lipase: 32 U/L (ref 11–51)

## 2017-05-20 LAB — TROPONIN I
TROPONIN I: 0.06 ng/mL — AB (ref <0.03)
Troponin I: 0.05 ng/mL (ref <0.03)

## 2017-05-20 LAB — CBC
HCT: 32.4 % — ABNORMAL LOW (ref 39.0–52.0)
Hemoglobin: 10.8 g/dL — ABNORMAL LOW (ref 13.0–17.0)
MCH: 30.3 pg (ref 26.0–34.0)
MCHC: 33.3 g/dL (ref 30.0–36.0)
MCV: 90.8 fL (ref 78.0–100.0)
Platelets: 250 10*3/uL (ref 150–400)
RBC: 3.57 MIL/uL — ABNORMAL LOW (ref 4.22–5.81)
RDW: 14.6 % (ref 11.5–15.5)
WBC: 9.5 10*3/uL (ref 4.0–10.5)

## 2017-05-20 LAB — CBG MONITORING, ED
GLUCOSE-CAPILLARY: 385 mg/dL — AB (ref 65–99)
Glucose-Capillary: 379 mg/dL — ABNORMAL HIGH (ref 65–99)

## 2017-05-20 MED ORDER — ATORVASTATIN CALCIUM 80 MG PO TABS
80.0000 mg | ORAL_TABLET | Freq: Every day | ORAL | Status: DC
  Administered 2017-05-21 – 2017-05-24 (×4): 80 mg via ORAL
  Filled 2017-05-20 (×4): qty 1

## 2017-05-20 MED ORDER — ALBUTEROL SULFATE (2.5 MG/3ML) 0.083% IN NEBU: 2.5000 mg | INHALATION_SOLUTION | Freq: Four times a day (QID) | RESPIRATORY_TRACT | Status: DC | PRN

## 2017-05-20 MED ORDER — LEVOTHYROXINE SODIUM 50 MCG PO TABS
50.0000 ug | ORAL_TABLET | Freq: Every day | ORAL | Status: DC
  Administered 2017-05-21 – 2017-05-24 (×4): 50 ug via ORAL
  Filled 2017-05-20 (×7): qty 1

## 2017-05-20 MED ORDER — CINACALCET HCL 30 MG PO TABS
30.0000 mg | ORAL_TABLET | ORAL | Status: DC
  Filled 2017-05-20 (×3): qty 1

## 2017-05-20 MED ORDER — CYCLOBENZAPRINE HCL 10 MG PO TABS
10.0000 mg | ORAL_TABLET | Freq: Three times a day (TID) | ORAL | Status: DC | PRN
  Administered 2017-05-21 – 2017-05-24 (×3): 10 mg via ORAL
  Filled 2017-05-20 (×3): qty 1

## 2017-05-20 MED ORDER — DULOXETINE HCL 30 MG PO CPEP
30.0000 mg | ORAL_CAPSULE | Freq: Every day | ORAL | Status: DC
  Administered 2017-05-22 – 2017-05-25 (×4): 30 mg via ORAL
  Filled 2017-05-20 (×6): qty 1

## 2017-05-20 MED ORDER — SODIUM CHLORIDE 0.9 % IV BOLUS
500.0000 mL | Freq: Once | INTRAVENOUS | Status: AC
  Administered 2017-05-20: 500 mL via INTRAVENOUS

## 2017-05-20 MED ORDER — INSULIN ASPART 100 UNIT/ML ~~LOC~~ SOLN
10.0000 [IU] | Freq: Once | SUBCUTANEOUS | Status: AC
  Administered 2017-05-20: 10 [IU] via INTRAVENOUS
  Filled 2017-05-20: qty 1

## 2017-05-20 MED ORDER — ONDANSETRON HCL 4 MG/2ML IJ SOLN
4.0000 mg | Freq: Once | INTRAMUSCULAR | Status: AC
  Administered 2017-05-20: 4 mg via INTRAVENOUS
  Filled 2017-05-20: qty 2

## 2017-05-20 MED ORDER — FLUTICASONE PROPIONATE 50 MCG/ACT NA SUSP
1.0000 | Freq: Every day | NASAL | Status: DC
  Administered 2017-05-24: 1 via NASAL
  Filled 2017-05-20: qty 16

## 2017-05-20 MED ORDER — CALCIUM GLUCONATE 10 % IV SOLN
1.0000 g | Freq: Once | INTRAVENOUS | Status: AC
  Administered 2017-05-20: 1 g via INTRAVENOUS
  Filled 2017-05-20: qty 10

## 2017-05-20 MED ORDER — SODIUM BICARBONATE 8.4 % IV SOLN
50.0000 meq | Freq: Once | INTRAVENOUS | Status: AC
  Administered 2017-05-20: 50 meq via INTRAVENOUS
  Filled 2017-05-20: qty 50

## 2017-05-20 MED ORDER — INSULIN ASPART 100 UNIT/ML ~~LOC~~ SOLN
0.0000 [IU] | SUBCUTANEOUS | Status: DC
  Administered 2017-05-20: 15 [IU] via SUBCUTANEOUS
  Administered 2017-05-21: 8 [IU] via SUBCUTANEOUS
  Filled 2017-05-20: qty 1

## 2017-05-20 MED ORDER — HYDROCODONE-ACETAMINOPHEN 5-325 MG PO TABS
1.0000 | ORAL_TABLET | ORAL | Status: DC | PRN
  Administered 2017-05-20 – 2017-05-25 (×16): 2 via ORAL
  Filled 2017-05-20 (×17): qty 2

## 2017-05-20 MED ORDER — SODIUM CHLORIDE 0.9 % IV SOLN
INTRAVENOUS | Status: DC
  Administered 2017-05-20: 15:00:00 via INTRAVENOUS

## 2017-05-20 MED ORDER — SENNOSIDES-DOCUSATE SODIUM 8.6-50 MG PO TABS
1.0000 | ORAL_TABLET | Freq: Every evening | ORAL | Status: DC | PRN
  Administered 2017-05-20 – 2017-05-23 (×2): 1 via ORAL
  Filled 2017-05-20 (×2): qty 1

## 2017-05-20 MED ORDER — INSULIN GLARGINE 100 UNIT/ML ~~LOC~~ SOLN
15.0000 [IU] | Freq: Two times a day (BID) | SUBCUTANEOUS | Status: DC
  Administered 2017-05-20 – 2017-05-25 (×8): 15 [IU] via SUBCUTANEOUS
  Filled 2017-05-20 (×12): qty 0.15

## 2017-05-20 MED ORDER — ASPIRIN 81 MG PO CHEW
81.0000 mg | CHEWABLE_TABLET | Freq: Every day | ORAL | Status: DC
  Administered 2017-05-21 – 2017-05-25 (×5): 81 mg via ORAL
  Filled 2017-05-20 (×5): qty 1

## 2017-05-20 MED ORDER — HEPARIN SODIUM (PORCINE) 5000 UNIT/ML IJ SOLN
5000.0000 [IU] | Freq: Three times a day (TID) | INTRAMUSCULAR | Status: DC
  Administered 2017-05-24: 5000 [IU] via SUBCUTANEOUS
  Filled 2017-05-20 (×9): qty 1

## 2017-05-20 MED ORDER — ONDANSETRON HCL 4 MG/2ML IJ SOLN
4.0000 mg | Freq: Three times a day (TID) | INTRAMUSCULAR | Status: DC | PRN
  Administered 2017-05-20: 4 mg via INTRAVENOUS
  Filled 2017-05-20: qty 2

## 2017-05-20 MED ORDER — GABAPENTIN 100 MG PO CAPS
200.0000 mg | ORAL_CAPSULE | Freq: Three times a day (TID) | ORAL | Status: DC
  Administered 2017-05-20 – 2017-05-21 (×4): 200 mg via ORAL
  Filled 2017-05-20 (×4): qty 2

## 2017-05-20 MED ORDER — METOPROLOL SUCCINATE ER 25 MG PO TB24
37.5000 mg | ORAL_TABLET | Freq: Two times a day (BID) | ORAL | Status: DC
  Administered 2017-05-20 – 2017-05-24 (×9): 37.5 mg via ORAL
  Filled 2017-05-20 (×3): qty 2
  Filled 2017-05-20: qty 1.5
  Filled 2017-05-20 (×8): qty 2

## 2017-05-20 MED ORDER — HYDROMORPHONE HCL 2 MG/ML IJ SOLN
1.0000 mg | Freq: Once | INTRAMUSCULAR | Status: AC
  Administered 2017-05-20: 1 mg via INTRAVENOUS
  Filled 2017-05-20: qty 1

## 2017-05-20 MED ORDER — METOPROLOL TARTRATE 5 MG/5ML IV SOLN
5.0000 mg | Freq: Once | INTRAVENOUS | Status: AC
  Administered 2017-05-20: 5 mg via INTRAVENOUS
  Filled 2017-05-20: qty 5

## 2017-05-20 NOTE — ED Notes (Signed)
Insulin verified with callie, RN

## 2017-05-20 NOTE — ED Notes (Signed)
Per EDP, KVO fluids. Fluid rate changed to 20 ml/hr.

## 2017-05-20 NOTE — ED Notes (Signed)
ED Provider at bedside. 

## 2017-05-20 NOTE — ED Notes (Signed)
Insulin verified with Merry Proud, RN

## 2017-05-20 NOTE — ED Notes (Addendum)
Admitting at bedside, notified of pt BP and that pt missed metropolol dose this AM. Will continue to monitor.

## 2017-05-20 NOTE — H&P (Addendum)
Date: 05/21/2017               Patient Name:  Russell [name] MRN: [id number]  DOB: 03/01/1970 Age / Sex: 47 y.o., male   PCP: [name], MD         Medical Service: Internal Medicine Teaching Service         Attending Physician: Dr. Oswaldo Done, Marquita Palms, *    First Contact: Dr. Renaldo Reel Pager: [phone]  Second Contact: Dr. Antony Contras Pager: [phone]       After Hours (After 5p/  First Contact Pager: [phone]  weekends / holidays): Second Contact Pager: [phone]   Chief Complaint: abdominal pain, N/V  History of Present Illness:  Mr. Mier is a 47yo male with PMH of DM, gastroparesis, ESRD on HD MWF, CIDP, HFrEF (EF 25-30%), CAD s/p CABG in 2017, HTN, and hypothyroidism who presents with abdominal pain, nausea, and vomiting.  He was in his usual state of health until yesterday after lunch when he developed abdominal soreness and swelling, nausea, NB emesis, and NB diarrhea. He did not eat anything out of the ordinary for lunch. Denies recent travel or sick contacts. He denies fever or cough. After several episodes of emesis, he then developed shortness of breath and left sided chest pressure under his breast, which was non-radiating and sharp in nature. He endorses bilateral lower extremity swelling and redness.  He had a CABG 2 years ago, prior to which he did not experience chest pressure - he reports these symptoms are not similar. His paternal and maternal grandfather had a heart attack, but he does not recall their age at the time.  While he was in the emergency room, he was lying back at about 30 degrees when he noted increased shortness of breath, requiring supplemental oxygen to be placed.  He denies tobacco use, alcohol use, or other illicit drug use. He lives at home with his mom and dad and is adherent to HD, last session was a full session on Friday.  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 His symptoms include bilateral hand and foot weakness and numbness. He is about to start mycophenolate but has not yet started it.  ED Course: - BP 191/114, HR 119, RR 20, O2 95% on RA - Hb 10.8. CMP with Na 132, K 5.5, bicarb 25, glucose 426, Cr 10.01. Lipase 32, beta-hydroxybutyric acid 1.48 - CXR with vascular congestion, no pleural effusion or consolidation. EKG with new LBBB, tachycardia, no ST elevation - Received IV calcium gluconate, dilaudid, Novolog 10 units, sodium bicarb, zofran, and IV NS 500cc bolus and 100cc/hr maintenance fluids - Became hypoxic while in the ER, placed on [place]. Nephrology consulted by EDP  Meds:  Current Meds  Medication Sig  . acetaminophen (TYLENOL) 500 MG tablet Take 1 tablet (500 mg total) by mouth every 6 (six) hours as needed for moderate pain. (Patient taking differently: Take 1,000 mg by mouth every 6 (six) hours as needed (for pain). )  . albuterol (PROVENTIL HFA;VENTOLIN HFA) 108 (90 Base) MCG/ACT inhaler Inhale 1 puff into the lungs every 6 (six) hours as needed for wheezing or shortness of breath. (Patient taking differently: Inhale 2 puffs into the lungs every 8 (eight) hours as needed for wheezing or shortness of breath. )  . aspirin EC 81 MG EC tablet Take 1 tablet (81 mg total) by mouth daily.  Marland Kitchen atorvastatin (LIPITOR)  80 MG tablet Take 1 tablet (80 mg total) by mouth daily at 6 PM.  . cinacalcet (SENSIPAR) 30 MG tablet Take 30 mg by mouth every Monday, Wednesday, and Friday.  . cyclobenzaprine (FLEXERIL) 10 MG tablet Take 1 tablet (10 mg total) by mouth 3 (three) times daily as needed for muscle spasms.  . ferric gluconate 62.5 mg in sodium chloride 0.9 % 100 mL Inject 62.5 mg into the vein every Wednesday with hemodialysis.  . fluticasone (FLONASE) 50 MCG/ACT nasal spray Place 1 spray into both nostrils daily.  Marland Kitchen gabapentin (NEURONTIN) 100 MG capsule Take 2 capsules (200 mg total) by mouth 3 (three) times daily.  . hydrALAZINE  (APRESOLINE) 25 MG tablet Take 1 tablet (25 mg total) by mouth 3 (three) times daily. (Patient taking differently: Take 25 mg by mouth as needed. )  . HYDROcodone-acetaminophen (NORCO/VICODIN) 5-325 MG tablet Take 1-2 tablets by mouth every 4 (four) hours as needed for moderate pain.  . Immune Globulin, Human, (PRIVIGEN) 40 GM/400ML SOLN Inject 40 g into the vein every 30 (thirty) days. 40gm/dayx 5 days and then 40gm q4 weeks   . insulin aspart (NOVOLOG FLEXPEN) 100 UNIT/ML FlexPen Inject 8-20 Units into the skin 3 (three) times daily with meals. (Patient taking differently: Inject 8-20 Units into the skin 3 (three) times daily with meals. Sliding scale 5 units per carb.)  . Insulin Glargine (BASAGLAR KWIKPEN) 100 UNIT/ML SOPN Inject 30 units 2x a day under skin (Patient taking differently: Inject 30 Units into the skin 2 (two) times daily. Inject 30 units 2x a day under skin)  . levothyroxine (SYNTHROID, LEVOTHROID) 50 MCG tablet Take 1 tablet (50 mcg total) by mouth daily before breakfast.  . metoCLOPramide (REGLAN) 5 MG tablet Take 1 tablet (5 mg total) by mouth every 8 (eight) hours as needed for nausea or vomiting.  . sucralfate (CARAFATE) 1 g tablet Take 1 tablet (1 g total) by mouth 4 (four) times daily. (Patient taking differently: Take 1 g by mouth as needed. )   Allergies: Allergies as of 05/20/2017 - Review Complete 05/20/2017  Allergen Reaction Noted  . Nsaids Other (See Comments) 02/13/2015  . Tape Other (See Comments) 04/27/2016   Past Medical History:  Diagnosis Date  . Anxiety   . Asthma   . 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 Daily headache   . Depression   . DVT (deep venous thrombosis), H/o 01/2014-on Xarelto 03/24/2014   LLE  . ESRD (end stage renal  disease) on dialysis [facility])    "[place], MWF" (06/29/2016)  . Gait abnormality 12/22/2016  . Gastroparesis 12/22/2016  . Hypertension   . Hypothyroidism   . Nephrotic syndrome 05/18/2014  . Neuropathy   . Type 2 diabetes mellitus with diabetic nephropathy (HCC)    Family History:  Family History  Problem Relation Age of Onset  . Obesity Mother        Patient states that family members have no other medical illnesses other than what I have described  . Kidney cancer Maternal Grandmother   . Cancer Father 50       AML  . Heart disease Unknown        No family history   Social History:  - denies tobacco use, alcohol use, or other illicit drug use -  lives at home with his mom and dad  Review of Systems: A complete ROS was negative except as per HPI.  Physical Exam: Blood pressure (!) 138/91, pulse 98, temperature 98.5 F (36.9 C), temperature source Oral, resp. rate 16, height  (1.88 m), weight 251 lb 5.2 oz (114 kg), SpO2 100 %. GEN: Appears uncomfortable, diaphoretic. Alert and oriented. In mild distress. Able to answer questions appropriately HENT: [place]/AT. Moist mucous membranes. No visible lesions. No erythema of posterior pharynx. EYES: Anisocoria, R pupil very slightly larger than L pupil. Sclera non-icteric. Conjunctiva clear. RESP: Crackles in bilateral bases up to mid-lungs, L>R. No wheezes. No increased work of breathing, on 4L [place] CV: Tachycardic and regular rhythm. No murmurs, gallops, or rubs. Trace LE edema. Midline anterior well-healed scar, no tenderness to palpation of anterior chest. ABD: Soft. Obese. Diffuse soreness to palpation. Non-distended. Decreased bowel sounds. No rebound or guarding. EXT: No edema. Warm, 2+ DP pulses bilaterally. Mild redness on bilateral lower extremities, no skin breaks. Numbness in bilateral lower extremities to mid-knee, as well as bilateral hands. NEURO: Cranial nerves II-XII grossly intact. Able to lift all four extremities  against gravity. 4/5 grip strength bilaterally, 4/5 ankle flexion and extension bilaterally. No apparent audiovisual hallucinations. Speech fluent and appropriate. PSYCH: Patient is calm and pleasant. Appropriate affect. Well-groomed; speech is appropriate and on-subject.  Labs CBC Latest Ref Rng & Units 05/21/2017 05/20/2017 03/22/2017  WBC 4.0 - 10.5 K/uL 12.6(H) 9.5 9.4  Hemoglobin 13.0 - 17.0 g/dL 2.1(H) 10.8(L) 10.7(L)  Hematocrit 39.0 - 52.0 % 28.8(L) 32.4(L) 33.5(L)  Platelets 150 - 400 K/uL 258 250 213   CMP Latest Ref Rng & Units 05/21/2017 05/21/2017 05/20/2017  Glucose 65 - 99 mg/dL 086(V) 97 784(O)  BUN 6 - 20 mg/dL 96(E) 95(M) 84(X)  Creatinine 0.61 - 1.24 mg/dL 32.44(W) 10.27(O) 53.66(Y)  Sodium 135 - 145 mmol/L 134(L) 137 132(L)  Potassium 3.5 - 5.1 mmol/L 4.5 5.0 5.5(H)  Chloride 101 - 111 mmol/L 87(L) 88(L) 87(L)  CO2 22 - 32 mmol/L Calcium 8.9 - 10.3 mg/dL 4.0(H) 9.0 9.2  Total Protein 6.5 - 8.1 g/dL - - 7.3  Total Bilirubin 0.3 - 1.2 mg/dL - - 1.1  Alkaline Phos 38 - 126 U/L - - 77  AST 15 - 41 U/L - - 20  ALT 17 - 63 U/L - - 18   Lipase 32 Beta-hydroxybutyric acid 1.48 Troponin 0.05  EKG: personally reviewed my interpretation is new LBBB, tachycardia, no ST elevation  CXR: personally reviewed my interpretation is vascular congestion, no pleural effusion or consolidation  Assessment & Plan by Problem: Active Problems:   Pulmonary edema  Mr. Choyce is a 47yo male with PMH of DM, gastroparesis, ESRD on HD MWF, CIDP, HFrEF (EF 25-30%), CAD s/p CABG, HTN, and hypothyroidism who presents with 1 day of abdominal soreness, N/V/D, and subsequent development of CP and SOB.  Abdominal pain, N/V/D No known trigger. Most likely etiology is viral gastroenteritis, although denies recent travel or dietary changes. Alternative thought could be withdrawal from narcotics although he did get a dose of IV dilaudid prior to our evaluation, making this less likely. His last  prescription for Norco was written on 04/02/2017 for #30 tablets, however on the [place] database, it does not appear that this was filled. He recently started semaglutide, which can have GI side effects (although he was on liraglutide prior to this). He does have a history of gastroparesis, but the diarrhea makes this  less likely. Could also consider esophageal microperforation or microperforation causing acute onset of shortness of breath in setting of multiple episodes of emesis, however patient denies bloody emesis. - No fluids for now, given ESRD - Hold home semaglutide - Zofran  q8h PRN for N/V - Norco q4h PRN for moderate pain - BMP in AM  Acute hypoxic respiratory failure Pulmonary edema Hx of HFrEF (TTE 03/2017: EF 25-30%) CXR notable for vascular congestion, and patient noted to be hypoxic with new oxygen requirement. Patient is on dialysis, adherent to his typical schedule. - Nephrology consulted; appreciate their assistance - Daily weights - Monitor fever curve - If becomes febrile, would consider broad coverage with empiric vancomycin and cefepime - Continue home metoprolol 37.5mg  BID - BMP in AM  Chest pressure New LBBB, Troponinemia Hx of CAD s/p CABG in 2017 Recent cath 03/2017 performed for newly reduced EF showed patent grafts and likely nonischemic/microvascular etiology. With hx of CABG, concerning for possible atypical presentation of ACS. Will continue trending troponins and repeat EKG - Telemetry - Trend troponins - Repeat EKG in AM - Continue home aspirin  daily and atorvastatin  QHS  ESRD on HD MWF Last session Friday, appears volume overloaded on exam. Nephrology consulted by EDP for inpatient dialysis recommendations. K 5.5, received IV calcium gluconate - Nephrology consulted; appreciate their assistance - BMP in AM  Hx of CIDP CIDP can cause respiratory muscle weakness, however he is able to move all extremities spontaneously, making this less likely.  Receiving monthly IVIG (last treatment about a week ago) and planning to start mycophenolate - Norco q4h PRN for moderate pain  Hx of DM A1c 11.9 in 01/2017. Home regimen includes Novolog 8-20 units TID WC, glargine 30 units BID, and semaglutide 0.5mg  qweek - Lantus 15u BID - Novolog SSI-M q4h - CBG monitoring  Diet: Renal/CM, 1200cc FR VTE PPx: SQH Code Status: Full code Dispo: Admit patient to Inpatient with expected length of stay greater than 2 midnights.  Signed: Scherrie Gerlach, MD 05/21/2017, 1:32 PM  Pager: Demetrius Charity [phone]

## 2017-05-20 NOTE — ED Triage Notes (Addendum)
Patient complains of nausea and vomiting since yesterday. Patient reports concern of hyperglycemia, CBG 379 in triage. [place] in left arm, MWF dialysis, last full treatment on Friday. Denies other  Complaints at this time. Patient states he does not make urine.

## 2017-05-20 NOTE — ED Notes (Addendum)
Pt desatted to 74%, pt placed on 4L [place] pt now 99%, EDP notified. Will continue to monitor.

## 2017-05-20 NOTE — ED Provider Notes (Signed)
[facility] EMERGENCY DEPARTMENT Provider Note   CSN: [id number] Arrival date & time: 05/20/17  1257     History   Chief Complaint Chief Complaint  Patient presents with  . Abdominal Pain    HPI Russell [name] is a 47 y.o. male.  HPI   47 year old male chief complaint of nausea and vomiting.  Symptom onset yesterday.  Multiple episodes since then.  He states he has been unable to keep anything down.  He is concerned that he may potentially be in DKA.  His blood glucose has been "up and down" over the past several days.  I see reports though it is in the high 300s.  He reports compliance with his medications.  No fevers or chills.  Some abdominal "soreness" which she attributes to all the vomiting.  Some loose stools.  No blood in stool or emesis.  No sick contacts.  End-stage renal disease.  Last dialyzed on Friday.  He says that he is essentially anuric at baseline.  Past Medical History:  Diagnosis Date  . Anxiety   . Asthma   . 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 Daily headache   . Depression   . DVT (deep venous thrombosis), H/o 01/2014-on Xarelto 03/24/2014   LLE  . ESRD (end stage renal disease) on dialysis [facility])    "[place], MWF" (06/29/2016)  . Gait abnormality 12/22/2016  . Gastroparesis 12/22/2016  . Hypertension   . Hypothyroidism   . Nephrotic syndrome 05/18/2014  . Neuropathy   . Type 2 diabetes mellitus with diabetic nephropathy [facility])     Patient Active Problem List   Diagnosis Date Noted  . Chronic pain syndrome 04/23/2017  . Chronic peripheral neuropathic pain 04/23/2017  . DKA (diabetic ketoacidoses) (HCC) 03/19/2017  . CAP (community acquired pneumonia) 03/01/2017  . Anxiety 02/06/2017  . Asthma 02/06/2017  .  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 Hyperlipidemia 02/06/2017  . Macular degeneration 02/06/2017  . Neuropathy 02/06/2017  . Restrictive airway disease 02/06/2017  . 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 Quadriparesis (HCC) 12/22/2016  . Gait abnormality 12/22/2016  . Hypertensive urgency 10/13/2016  . Acute pulmonary edema (HCC)   . Anemia in chronic kidney disease, on chronic dialysis (HCC)   . Weakness   . Hyperkalemia 09/26/2016  . History of DVT of lower extremity 08/17/2016  . Arteriosclerotic vascular disease 08/17/2016  . DKA, type 1 (HCC) 06/29/2016  . Nausea vomiting and diarrhea 06/29/2016  . Malignant hypertension 06/29/2016  . Hypoxemic respiratory failure, chronic (HCC) 05/16/2016  . Anemia due to end stage renal disease (HCC) 05/16/2016  . Pre-transplant evaluation for kidney transplant 04/25/2016  . S/P CABG (coronary artery bypass graft) 09/28/2015  . Coronary artery disease involving native coronary artery of native heart without angina pectoris   . ESRD on dialysis (HCC) 02/13/2015  . Nephrotic syndrome 01/03/2015  . Essential hypertension, benign   . Diabetic ketoacidosis without coma associated with type 2 diabetes mellitus (HCC)   . Hx of gastroesophageal reflux (GERD) 09/30/2014  . Diabetic neuropathy (HCC) 09/21/2014  . Uncontrolled diabetes mellitus type 2 with peripheral artery disease (HCC) 09/21/2014  . Morbid obesity (HCC) 09/21/2014  . Proteinuria 07/09/2014  . Chest pain 04/22/2014  . Superficial thrombophlebitis 03/24/2014  . Hypothyroidism (acquired) 03/24/2014  Past Surgical History:  Procedure Laterality Date  . ANKLE FRACTURE SURGERY Right 1988  . AV FISTULA PLACEMENT Left 01/01/2015   Procedure: CREATION OF LEFT RADIAL CEPHALIC ARTERIOVENOUS (AV) FISTULA ;  Surgeon: Pryor Ochoa, MD;  Location: [facility] OR;  Service: Vascular;  Laterality: Left;  . CAPD REMOVAL N/A 05/19/2016   Procedure: PD CATH REMOVAL;   Surgeon: Abigail Miyamoto, MD;  Location: [facility] OR;  Service: General;  Laterality: N/A;  . CARDIAC CATHETERIZATION N/A 09/22/2015   Procedure: Left Heart Cath and Coronary Angiography;  Surgeon: Iran Ouch, MD;  Location: [facility] INVASIVE CV LAB;  Service: Cardiovascular;  Laterality: N/A;  . CORONARY ARTERY BYPASS GRAFT N/A 09/28/2015   Procedure: CORONARY ARTERY BYPASS GRAFTING (CABG) x 3 UTILIZING LEFT MAMMARY ARTERY AND ENDOSCOPICALLY HARVESTED LEFT GREATER SAPHENOUS VEIN.;  Surgeon: Delight Ovens, MD;  Location: [facility] OR;  Service: Open Heart Surgery;  Laterality: N/A;  . ENDOVEIN HARVEST OF GREATER SAPHENOUS VEIN Left 09/28/2015   Procedure: ENDOVEIN HARVEST OF GREATER SAPHENOUS VEIN;  Surgeon: Delight Ovens, MD;  Location: [facility] OR;  Service: Open Heart Surgery;  Laterality: Left;  . ESOPHAGOGASTRODUODENOSCOPY N/A 03/29/2014   Procedure: ESOPHAGOGASTRODUODENOSCOPY (EGD);  Surgeon: Dorena Cookey, MD;  Location: [facility] ENDOSCOPY;  Service: Endoscopy;  Laterality: N/A;  . EYE SURGERY    . FRACTURE SURGERY    . INSERTION OF DIALYSIS CATHETER Right 01/05/2015   Procedure: INSERTION OF RIGHT INTERNAL JUGULAR DIALYSIS CATHETER;  Surgeon: Fransisco Hertz, MD;  Location: [facility] OR;  Service: Vascular;  Laterality: Right;  . LEFT HEART CATH AND CORS/GRAFTS ANGIOGRAPHY N/A 09/13/2016   Procedure: LEFT HEART CATH AND CORS/GRAFTS ANGIOGRAPHY;  Surgeon: Kathleene Hazel, MD;  Location: [facility] INVASIVE CV LAB;  Service: Cardiovascular;  Laterality: N/A;  . RETINAL LASER PROCEDURE Bilateral   . RIGHT/LEFT HEART CATH AND CORONARY/GRAFT ANGIOGRAPHY N/A 03/06/2017   Procedure: RIGHT/LEFT HEART CATH AND CORONARY/GRAFT ANGIOGRAPHY;  Surgeon: Marykay Lex, MD;  Location: [facility] INVASIVE CV LAB;  Service: Cardiovascular;  Laterality: N/A;  . TEE WITHOUT CARDIOVERSION N/A 09/28/2015   Procedure: TRANSESOPHAGEAL ECHOCARDIOGRAM (TEE);  Surgeon: Delight Ovens, MD;  Location: [facility] OR;  Service: Open Heart Surgery;  Laterality: N/A;    . TONSILLECTOMY AND ADENOIDECTOMY  1970s        Home Medications    Prior to Admission medications   Medication Sig Start Date End Date Taking? Authorizing Provider  acetaminophen (TYLENOL) 500 MG tablet Take 1 tablet (500 mg total) by mouth every 6 (six) hours as needed for moderate pain. Patient taking differently: Take 1,000 mg by mouth every 6 (six) hours as needed (for pain).  09/29/14   Albertine Grates, MD  albuterol (PROVENTIL HFA;VENTOLIN HFA) 108 (90 Base) MCG/ACT inhaler Inhale 1 puff into the lungs every 6 (six) hours as needed for wheezing or shortness of breath. Patient taking differently: Inhale 2 puffs into the lungs every 8 (eight) hours as needed for wheezing or shortness of breath.  03/11/15   [name], MD  aspirin EC 81 MG EC tablet Take 1 tablet (81 mg total) by mouth daily. 10/06/15   Barrett, Erin R, PA-C  atorvastatin (LIPITOR) 80 MG tablet Take 1 tablet (80 mg total) by mouth daily at 6 PM. 01/05/17   Azalee Course, PA  cinacalcet [facility]) 30 MG tablet Take 30 mg by mouth every Monday, Wednesday, and Friday.    [provider]  cyclobenzaprine (FLEXERIL) 10 MG tablet Take 1 tablet (10 mg total) by mouth 3 (three)  times daily as needed for muscle spasms. 04/23/17   [name], MD  DULoxetine (CYMBALTA) 30 MG capsule Take 1 capsule (30 mg total) by mouth daily. 04/24/17   York Spaniel, MD  ferric gluconate 62.5 mg in sodium chloride 0.9 % 100 mL Inject 62.5 mg into the vein every Wednesday with hemodialysis. 03/14/17   Briant Cedar, MD  fluticasone (FLONASE) 50 MCG/ACT nasal spray Place 1 spray into both nostrils daily. 03/14/17   Briant Cedar, MD  gabapentin (NEURONTIN) 100 MG capsule Take 2 capsules (200 mg total) by mouth 3 (three) times daily. 04/23/17 05/23/17  [name], MD  hydrALAZINE (APRESOLINE) 25 MG tablet Take 1 tablet (25 mg total) by mouth 3 (three) times daily. 03/22/17   Drema Dallas, MD  HYDROcodone-acetaminophen (NORCO/VICODIN)  5-325 MG tablet Take 1-2 tablets by mouth every 4 (four) hours as needed for moderate pain. 04/23/17   [name], MD  Immune Globulin, Human, (PRIVIGEN) 40 GM/400ML SOLN Inject 40 g into the vein. 40gm/dayx 5 days and then 40gm q4 weeks    [provider]  insulin aspart (NOVOLOG FLEXPEN) 100 UNIT/ML FlexPen Inject 8-20 Units into the skin 3 (three) times daily with meals. 04/23/17   Carlus Pavlov, MD  Insulin Glargine (BASAGLAR KWIKPEN) 100 UNIT/ML SOPN Inject 30 units 2x a day under skin 04/23/17   Carlus Pavlov, MD  levothyroxine (SYNTHROID, LEVOTHROID) 50 MCG tablet Take 1 tablet (50 mcg total) by mouth daily before breakfast. 01/05/17   Carlus Pavlov, MD  metoCLOPramide (REGLAN) 5 MG tablet Take 1 tablet (5 mg total) by mouth every 8 (eight) hours as needed for nausea or vomiting. 04/23/17   [name], MD  metoprolol succinate (TOPROL-XL) 25 MG 24 hr tablet Take 1.5 tablets (37.5 mg total) by mouth 2 (two) times daily. 03/22/17   Drema Dallas, MD  mycophenolate (CELLCEPT) 250 MG capsule Take 1 capsule (250 mg total) by mouth 2 (two) times daily. 04/24/17   York Spaniel, MD  Semaglutide [facility]) 0.25 or 0.5 MG/DOSE SOPN Inject 0.5 mg into the skin once a week. 04/23/17   Carlus Pavlov, MD  sucralfate (CARAFATE) 1 g tablet Take 1 tablet (1 g total) by mouth 4 (four) times daily. 03/22/17 03/22/18  Drema Dallas, MD    Family History Family History  Problem Relation Age of Onset  . Obesity Mother        Patient states that family members have no other medical illnesses other than what I have described  . Kidney cancer Maternal Grandmother   . Cancer Father 50       AML  . Heart disease Unknown        No family history    Social History Social History   Tobacco Use  . Smoking status: Never Smoker  . Smokeless tobacco: Never Used  Substance Use Topics  . Alcohol use: Yes    Alcohol/week: 0.0 oz    Comment: 06/29/2016 "might have a few drinks/year"  . Drug  use: No     Allergies   Nsaids and Tape   Review of Systems Review of Systems  All systems reviewed and negative, other than as noted in HPI.  Physical Exam Updated Vital Signs BP (!) 212/114   Pulse (!) 114   Resp 20   SpO2 98%   Physical Exam  Constitutional: He appears well-developed and well-nourished. No distress.  HENT:  Head: Normocephalic and atraumatic.  Eyes: Conjunctivae are  normal. Right eye exhibits no discharge. Left eye exhibits no discharge.  Neck: Neck supple.  Cardiovascular: Normal rate, regular rhythm and normal heart sounds. Exam reveals no gallop and no friction rub.  No murmur heard. Pulmonary/Chest: Effort normal and breath sounds normal. No respiratory distress.  Abdominal: Soft. He exhibits no distension. There is no tenderness.  Musculoskeletal: He exhibits no edema or tenderness.  Neurological: He is alert.  Skin: Skin is warm and dry.  Psychiatric: He has a normal mood and affect. His behavior is normal. Thought content normal.  Nursing note and vitals reviewed.    ED Treatments / Results  Labs (all labs ordered are listed, but only abnormal results are displayed) Labs Reviewed  COMPREHENSIVE METABOLIC PANEL - Abnormal; Notable for the following components:      Result Value   Sodium 132 (*)    Potassium 5.5 (*)    Chloride 87 (*)    Glucose, Bld 426 (*)    BUN 49 (*)    Creatinine, Ser 10.01 (*)    GFR calc non Af Amer 5 (*)    GFR calc Af Amer 6 (*)    Anion gap 20 (*)    All other components within normal limits  CBC - Abnormal; Notable for the following components:   RBC 3.57 (*)    Hemoglobin 10.8 (*)    HCT 32.4 (*)    All other components within normal limits  BETA-HYDROXYBUTYRIC ACID - Abnormal; Notable for the following components:   Beta-Hydroxybutyric Acid 1.48 (*)    All other components within normal limits  CBG MONITORING, ED - Abnormal; Notable for the following components:   Glucose-Capillary 379 (*)    All  other components within normal limits  LIPASE, BLOOD  TROPONIN I    EKG EKG Interpretation  Date/Time:  Sunday May 20 2017 13:53:59 EDT Ventricular Rate:  112 PR Interval:  148 QRS Duration: 142 QT Interval:  378 QTC Calculation: 516 R Axis:   25 Text Interpretation:  Sinus or ectopic atrial tachycardia Left bundle branch block Confirmed by Raeford Razor [phone]) on 05/20/2017 3:30:20 PM   Radiology Dg Chest Portable 1 View  Result Date: 05/20/2017 CLINICAL DATA:  Nausea and vomiting since yesterday. Dialysis patient. EXAM: PORTABLE CHEST 1 VIEW COMPARISON:  03/19/2017 FINDINGS: Sternotomy wires are unchanged. Lungs are adequately inflated without focal airspace consolidation or effusion. Minimal prominence of the central perihilar markings which may indicate a mild degree of vascular congestion. Cardiomediastinal silhouette and remainder of the exam is unchanged. IMPRESSION: Possible mild vascular congestion. Electronically Signed   By: Elberta Fortis M.D.   On: 05/20/2017 14:26    Procedures Procedures (including critical care time)  Medications Ordered in ED Medications  0.9 %  sodium chloride infusion ( Intravenous Rate/Dose Change 05/20/17 1503)  ondansetron (ZOFRAN) injection 4 mg (4 mg Intravenous Given 05/20/17 1446)  HYDROmorphone (DILAUDID) injection 1 mg (1 mg Intravenous Given 05/20/17 1448)  sodium chloride 0.9 % bolus 500 mL (0 mLs Intravenous Stopped 05/20/17 1502)  insulin aspart (novoLOG) injection 10 Units (10 Units Intravenous Given 05/20/17 1458)  calcium gluconate inj 10% (1 g) URGENT USE ONLY! (1 g Intravenous Given 05/20/17 1450)  sodium bicarbonate injection 50 mEq (50 mEq Intravenous Given 05/20/17 1450)     Initial Impression / Assessment and Plan / ED Course  I have reviewed the triage vital signs and the nursing notes.  Pertinent labs & imaging results that were available during my care of the patient were  reviewed by me and considered in my medical  decision making (see chart for details).     47 year old male with nausea and vomiting.  He is hyperglycemic but no metabolic acidosis.  He is anuric.  Symptoms could be related to gastroparesis. Possible viral GI illness. Abdominal exam is fairly benign.  While in the emergency room patient became hypoxic.  His O2 sats now in the 90s on nasal cannula.  Chest x-ray with some evidence of volume overload.  He did receive approximately 300 cc of normal saline prior to this.  Fluids were switched to [facility].  Nephrology was notified that patient will be admitted to the medical service.  His EKG shows what appears to be a new left bundle branch block as compared to prior.  Known CAD s/p CABG.  Denies any chest pain.  Denies having much dyspnea prior to arriving to the ER.  We will add a troponin.  Final Clinical Impressions(s) / ED Diagnoses   Final diagnoses:  Nausea and vomiting, intractability of vomiting not specified, unspecified vomiting type  Hyperglycemia  ESRD (end stage renal disease) (HCC)  Hyperkalemia  Abnormal EKG    ED Discharge Orders    None       Raeford Razor, MD 05/20/17 1601

## 2017-05-20 NOTE — Consult Note (Addendum)
[facility] Renal Consultation Note    Indication for Consultation:  Management of ESRD/hemodialysis; anemia, hypertension/volume and secondary hyperparathyroidism PCP: Norberto Sorenson, MD  HPI: Russell [name] is a 47 y.o. 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  Who presented with N, V, hyperglycemia, elevated K.  Last dialysis was Friday in [place].  We are unable to access outpatient HD records due to [facility] being down due to system upgrade.  He has a long history of high IDWG.  He said he got to his EDW Friday.  His BP tends to run high. His average gains are 4-5 kg.  The N and V started yesterday after lunch and he feels it is his gastroparesis.  He also had left sided chest pressure and SOB that was sharp and localized. He has some SOB but feels he can wait until tomorrow for HD.  He feels clammy.  He has difficulty walking due to LE swelling, bilateral hand and feet weakness.   He has had some mild diarrhea. No cough, fever or chills. He has LE bilateral swelling and redness.  His recent dialysis treatments have been uneventful.  Past Medical History:  Diagnosis Date  . Anxiety   . Asthma   . 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 Daily headache   . Depression   . DVT (deep venous thrombosis), H/o 01/2014-on Xarelto 03/24/2014   LLE  . ESRD (end stage renal disease) on dialysis [facility])    "[place], MWF" (06/29/2016)  . Gait abnormality 12/22/2016  . Gastroparesis 12/22/2016  . Hypertension   . Hypothyroidism   . Nephrotic syndrome 05/18/2014  . Neuropathy   . Type 2 diabetes mellitus with diabetic nephropathy [facility])    Past Surgical History:   Procedure Laterality Date  . ANKLE FRACTURE SURGERY Right 1988  . AV FISTULA PLACEMENT Left 01/01/2015   Procedure: CREATION OF LEFT RADIAL CEPHALIC ARTERIOVENOUS (AV) FISTULA ;  Surgeon: Pryor Ochoa, MD;  Location: [facility] OR;  Service: Vascular;  Laterality: Left;  . CAPD REMOVAL N/A 05/19/2016   Procedure: PD CATH REMOVAL;  Surgeon: Abigail Miyamoto, MD;  Location: [facility] OR;  Service: General;  Laterality: N/A;  . CARDIAC CATHETERIZATION N/A 09/22/2015   Procedure: Left Heart Cath and Coronary Angiography;  Surgeon: Iran Ouch, MD;  Location: [facility] INVASIVE CV LAB;  Service: Cardiovascular;  Laterality: N/A;  . CORONARY ARTERY BYPASS GRAFT N/A 09/28/2015   Procedure: CORONARY ARTERY BYPASS GRAFTING (CABG) x 3 UTILIZING LEFT MAMMARY ARTERY AND ENDOSCOPICALLY HARVESTED LEFT GREATER SAPHENOUS VEIN.;  Surgeon: Delight Ovens, MD;  Location: [facility] OR;  Service: Open Heart Surgery;  Laterality: N/A;  . ENDOVEIN HARVEST OF GREATER SAPHENOUS VEIN Left 09/28/2015   Procedure: ENDOVEIN HARVEST OF GREATER SAPHENOUS VEIN;  Surgeon: Delight Ovens, MD;  Location: [facility] OR;  Service: Open Heart Surgery;  Laterality: Left;  . ESOPHAGOGASTRODUODENOSCOPY N/A 03/29/2014   Procedure: ESOPHAGOGASTRODUODENOSCOPY (EGD);  Surgeon: Dorena Cookey, MD;  Location: [facility];  Service: Endoscopy;  Laterality: N/A;  . EYE SURGERY    . FRACTURE SURGERY    . INSERTION OF DIALYSIS CATHETER Right 01/05/2015   Procedure: INSERTION OF RIGHT INTERNAL JUGULAR DIALYSIS CATHETER;  Surgeon: Fransisco Hertz, MD;  Location: [facility] OR;  Service: Vascular;  Laterality: Right;  . LEFT HEART CATH AND CORS/GRAFTS ANGIOGRAPHY N/A 09/13/2016   Procedure: LEFT HEART CATH AND CORS/GRAFTS ANGIOGRAPHY;  Surgeon: Kathleene Hazel, MD;  Location: [facility] INVASIVE CV LAB;  Service: Cardiovascular;  Laterality: N/A;  . RETINAL LASER PROCEDURE Bilateral   . RIGHT/LEFT HEART CATH AND CORONARY/GRAFT ANGIOGRAPHY N/A 03/06/2017   Procedure: RIGHT/LEFT HEART CATH AND  CORONARY/GRAFT ANGIOGRAPHY;  Surgeon: Marykay Lex, MD;  Location: [facility] INVASIVE CV LAB;  Service: Cardiovascular;  Laterality: N/A;  . TEE WITHOUT CARDIOVERSION N/A 09/28/2015   Procedure: TRANSESOPHAGEAL ECHOCARDIOGRAM (TEE);  Surgeon: Delight Ovens, MD;  Location: [facility] OR;  Service: Open Heart Surgery;  Laterality: N/A;  . TONSILLECTOMY AND ADENOIDECTOMY  1970s   Family History  Problem Relation Age of Onset  . Obesity Mother        Patient states that family members have no other medical illnesses other than what I have described  . Kidney cancer Maternal Grandmother   . Cancer Father 50       AML  . Heart disease Unknown        No family history   Social History:  reports that he has never smoked. He has never used smokeless tobacco. He reports that he drinks alcohol. He reports that he does not use drugs. Allergies  Allergen Reactions  . Nsaids Other (See Comments)    Told to avoid all nsaids due to kidney disease   . Tape Other (See Comments)    Welts result, if left for a long amount of time   Prior to Admission medications   Medication Sig Start Date End Date Taking? Authorizing Provider  acetaminophen (TYLENOL) 500 MG tablet Take 1 tablet (500 mg total) by mouth every 6 (six) hours as needed for moderate pain. Patient taking differently: Take 1,000 mg by mouth every 6 (six) hours as needed (for pain).  09/29/14  Yes Albertine Grates, MD  albuterol (PROVENTIL HFA;VENTOLIN HFA) 108 (90 Base) MCG/ACT inhaler Inhale 1 puff into the lungs every 6 (six) hours as needed for wheezing or shortness of breath. Patient taking differently: Inhale 2 puffs into the lungs every 8 (eight) hours as needed for wheezing or shortness of breath.  03/11/15  Yes [name], MD  aspirin EC 81 MG EC tablet Take 1 tablet (81 mg total) by mouth daily. 10/06/15  Yes Barrett, Erin R, PA-C  atorvastatin (LIPITOR) 80 MG tablet Take 1 tablet (80 mg total) by mouth daily at 6 PM. 01/05/17  Yes Azalee Course, PA   cinacalcet (SENSIPAR) 30 MG tablet Take 30 mg by mouth every Monday, Wednesday, and Friday.   Yes [provider]  cyclobenzaprine (FLEXERIL) 10 MG tablet Take 1 tablet (10 mg total) by mouth 3 (three) times daily as needed for muscle spasms. 04/23/17  Yes [name], MD  ferric gluconate 62.5 mg in sodium chloride 0.9 % 100 mL Inject 62.5 mg into the vein every Wednesday with hemodialysis. 03/14/17  Yes Briant Cedar, MD  fluticasone (FLONASE) 50 MCG/ACT nasal spray Place 1 spray into both nostrils daily. 03/14/17  Yes Briant Cedar, MD  gabapentin (NEURONTIN) 100 MG capsule Take 2 capsules (200 mg total) by mouth 3 (three) times daily. 04/23/17 05/23/17 Yes [name], MD  hydrALAZINE (APRESOLINE) 25 MG tablet Take 1 tablet (25 mg total) by mouth 3 (three) times daily. Patient taking differently: Take 25 mg by mouth as needed.  03/22/17  Yes Joseph Art,  Roselind Messier, MD  HYDROcodone-acetaminophen (NORCO/VICODIN) 5-325 MG tablet Take 1-2 tablets by mouth every 4 (four) hours as needed for moderate pain. 04/23/17  Yes [name], MD  Immune Globulin, Human, (PRIVIGEN) 40 GM/400ML SOLN Inject 40 g into the vein every 30 (thirty) days. 40gm/dayx 5 days and then 40gm q4 weeks    Yes [provider]  insulin aspart (NOVOLOG FLEXPEN) 100 UNIT/ML FlexPen Inject 8-20 Units into the skin 3 (three) times daily with meals. Patient taking differently: Inject 8-20 Units into the skin 3 (three) times daily with meals. Sliding scale 5 units per carb. 04/23/17  Yes Carlus Pavlov, MD  Insulin Glargine (BASAGLAR KWIKPEN) 100 UNIT/ML SOPN Inject 30 units 2x a day under skin Patient taking differently: Inject 30 Units into the skin 2 (two) times daily. Inject 30 units 2x a day under skin 04/23/17  Yes Carlus Pavlov, MD  levothyroxine (SYNTHROID, LEVOTHROID) 50 MCG tablet Take 1 tablet (50 mcg total) by mouth daily before breakfast. 01/05/17  Yes Carlus Pavlov, MD  metoCLOPramide (REGLAN) 5  MG tablet Take 1 tablet (5 mg total) by mouth every 8 (eight) hours as needed for nausea or vomiting. 04/23/17  Yes [name], MD  sucralfate (CARAFATE) 1 g tablet Take 1 tablet (1 g total) by mouth 4 (four) times daily. Patient taking differently: Take 1 g by mouth as needed.  03/22/17 03/22/18 Yes Drema Dallas, MD  DULoxetine (CYMBALTA) 30 MG capsule Take 1 capsule (30 mg total) by mouth daily. 04/24/17   York Spaniel, MD  metoprolol succinate (TOPROL-XL) 25 MG 24 hr tablet Take 1.5 tablets (37.5 mg total) by mouth 2 (two) times daily. 03/22/17   Drema Dallas, MD  mycophenolate (CELLCEPT) 250 MG capsule Take 1 capsule (250 mg total) by mouth 2 (two) times daily. 04/24/17   York Spaniel, MD  Semaglutide [facility]) 0.25 or 0.5 MG/DOSE SOPN Inject 0.5 mg into the skin once a week. 04/23/17   Carlus Pavlov, MD   Current Facility-Administered Medications  Medication Dose Route Frequency Provider Last Rate Last Dose  . 0.9 %  sodium chloride infusion   Intravenous Continuous Reymundo Poll, MD 20 mL/hr at 05/20/17 1503    . albuterol (PROVENTIL) (2.5 MG/3ML) 0.083% nebulizer solution 2.5 mg  2.5 mg Nebulization Q6H PRN Reymundo Poll, MD      . Melene Muller ON 05/21/2017] aspirin chewable tablet 81 mg  81 mg Oral Daily Reymundo Poll, MD      . Melene Muller ON 05/21/2017] atorvastatin (LIPITOR) tablet 80 mg  80 mg Oral q1800 Reymundo Poll, MD      . Melene Muller ON 05/21/2017] cinacalcet (SENSIPAR) tablet 30 mg  30 mg Oral Q M,W,F Reymundo Poll, MD      . cyclobenzaprine (FLEXERIL) tablet 10 mg  10 mg Oral TID PRN Reymundo Poll, MD      . DULoxetine (CYMBALTA) DR capsule 30 mg  30 mg Oral Daily Reymundo Poll, MD      . Melene Muller ON 05/21/2017] fluticasone (FLONASE) 50 MCG/ACT nasal spray 1 spray  1 spray Each Nare Daily Reymundo Poll, MD      . gabapentin (NEURONTIN) capsule 200 mg  200 mg Oral TID Reymundo Poll, MD   200 mg at 05/20/17 1712  . heparin injection 5,000 Units   5,000 Units Subcutaneous Q8H Reymundo Poll, MD      . HYDROcodone-acetaminophen (NORCO/VICODIN) 5-325 MG per tablet 1-2 tablet  1-2 tablet Oral Q4H PRN Reymundo Poll, MD      .  insulin aspart (novoLOG) injection 0-15 Units  0-15 Units Subcutaneous Q4H Reymundo Poll, MD   15 Units at 05/20/17 1713  . insulin glargine (LANTUS) injection 15 Units  15 Units Subcutaneous BID Reymundo Poll, MD      . levothyroxine (SYNTHROID, LEVOTHROID) tablet 50 mcg  50 mcg Oral QAC breakfast Reymundo Poll, MD      . metoprolol succinate (TOPROL-XL) 24 hr tablet 37.5 mg  37.5 mg Oral BID Reymundo Poll, MD      . ondansetron [facility]) injection 4 mg  4 mg Intravenous Q8H PRN Reymundo Poll, MD      . senna-docusate (Senokot-S) tablet 1 tablet  1 tablet Oral QHS PRN Reymundo Poll, MD       Current Outpatient Medications  Medication Sig Dispense Refill  . acetaminophen (TYLENOL) 500 MG tablet Take 1 tablet (500 mg total) by mouth every 6 (six) hours as needed for moderate pain. (Patient taking differently: Take 1,000 mg by mouth every 6 (six) hours as needed (for pain). ) 30 tablet 0  . albuterol (PROVENTIL HFA;VENTOLIN HFA) 108 (90 Base) MCG/ACT inhaler Inhale 1 puff into the lungs every 6 (six) hours as needed for wheezing or shortness of breath. (Patient taking differently: Inhale 2 puffs into the lungs every 8 (eight) hours as needed for wheezing or shortness of breath. ) 1 Inhaler 11  . aspirin EC 81 MG EC tablet Take 1 tablet (81 mg total) by mouth daily.    Marland Kitchen atorvastatin (LIPITOR) 80 MG tablet Take 1 tablet (80 mg total) by mouth daily at 6 PM. 90 tablet 3  . cinacalcet (SENSIPAR) 30 MG tablet Take 30 mg by mouth every Monday, Wednesday, and Friday.    . cyclobenzaprine (FLEXERIL) 10 MG tablet Take 1 tablet (10 mg total) by mouth 3 (three) times daily as needed for muscle spasms. 30 tablet 0  . ferric gluconate 62.5 mg in sodium chloride 0.9 % 100 mL Inject 62.5 mg into the vein every  Wednesday with hemodialysis.    . fluticasone (FLONASE) 50 MCG/ACT nasal spray Place 1 spray into both nostrils daily. 1 g 0  . gabapentin (NEURONTIN) 100 MG capsule Take 2 capsules (200 mg total) by mouth 3 (three) times daily. 180 capsule 0  . hydrALAZINE (APRESOLINE) 25 MG tablet Take 1 tablet (25 mg total) by mouth 3 (three) times daily. (Patient taking differently: Take 25 mg by mouth as needed. ) 90 tablet 0  . HYDROcodone-acetaminophen (NORCO/VICODIN) 5-325 MG tablet Take 1-2 tablets by mouth every 4 (four) hours as needed for moderate pain. 30 tablet 0  . Immune Globulin, Human, (PRIVIGEN) 40 GM/400ML SOLN Inject 40 g into the vein every 30 (thirty) days. 40gm/dayx 5 days and then 40gm q4 weeks     . insulin aspart (NOVOLOG FLEXPEN) 100 UNIT/ML FlexPen Inject 8-20 Units into the skin 3 (three) times daily with meals. (Patient taking differently: Inject 8-20 Units into the skin 3 (three) times daily with meals. Sliding scale 5 units per carb.) 15 mL 11  . Insulin Glargine (BASAGLAR KWIKPEN) 100 UNIT/ML SOPN Inject 30 units 2x a day under skin (Patient taking differently: Inject 30 Units into the skin 2 (two) times daily. Inject 30 units 2x a day under skin) 15 mL 3  . levothyroxine (SYNTHROID, LEVOTHROID) 50 MCG tablet Take 1 tablet (50 mcg total) by mouth daily before breakfast. 45 tablet 3  . metoCLOPramide (REGLAN) 5 MG tablet Take 1 tablet (5 mg total) by mouth every 8 (eight) hours as  needed for nausea or vomiting. 30 tablet 0  . sucralfate (CARAFATE) 1 g tablet Take 1 tablet (1 g total) by mouth 4 (four) times daily. (Patient taking differently: Take 1 g by mouth as needed. ) 60 tablet 0  . DULoxetine (CYMBALTA) 30 MG capsule Take 1 capsule (30 mg total) by mouth daily. 30 capsule 3  . metoprolol succinate (TOPROL-XL) 25 MG 24 hr tablet Take 1.5 tablets (37.5 mg total) by mouth 2 (two) times daily. 60 tablet 0  . mycophenolate (CELLCEPT) 250 MG capsule Take 1 capsule (250 mg total) by  mouth 2 (two) times daily. 60 capsule 3  . Semaglutide (OZEMPIC) 0.25 or 0.5 MG/DOSE SOPN Inject 0.5 mg into the skin once a week. 2 pen 5   Labs: Basic Metabolic Panel: Recent Labs  Lab 05/20/17 1316  NA 132*  K 5.5*  CL 87*  CO2 25  GLUCOSE 426*  BUN 49*  CREATININE 10.01*  CALCIUM 9.2   Liver Function Tests: Recent Labs  Lab 05/20/17 1316  AST 20  ALT 18  ALKPHOS 77  BILITOT 1.1  PROT 7.3  ALBUMIN 4.0   Recent Labs  Lab 05/20/17 1316  LIPASE 32   No results for input(s): AMMONIA in the last 168 hours. CBC: Recent Labs  Lab 05/20/17 1316  WBC 9.5  HGB 10.8*  HCT 32.4*  MCV 90.8  PLT 250   Cardiac Enzymes: Recent Labs  Lab 05/20/17 1451  TROPONINI 0.05*   CBG: Recent Labs  Lab 05/20/17 1302 05/20/17 1651  GLUCAP 379* 385*   Iron Studies: No results for input(s): IRON, TIBC, TRANSFERRIN, FERRITIN in the last 72 hours. Studies/Results: Dg Chest Portable 1 View  Result Date: 05/20/2017 CLINICAL DATA:  Nausea and vomiting since yesterday. Dialysis patient. EXAM: PORTABLE CHEST 1 VIEW COMPARISON:  03/19/2017 FINDINGS: Sternotomy wires are unchanged. Lungs are adequately inflated without focal airspace consolidation or effusion. Minimal prominence of the central perihilar markings which may indicate a mild degree of vascular congestion. Cardiomediastinal silhouette and remainder of the exam is unchanged. IMPRESSION: Possible mild vascular congestion. Electronically Signed   By: Elberta Fortis M.D.   On: 05/20/2017 14:26   ROS: As per HPI otherwise negative.  Physical Exam: Vitals:   05/20/17 1626 05/20/17 1644 05/20/17 1700 05/20/17 1730  BP: (!) 183/96  (!) 178/106 (!) 164/106  Pulse: (!) 111  93 92  Resp: 20  20 (!) 23  SpO2: 100% 100% 99% 95%     General:  Obese ill appearing WM with ongoing N and vomiting Skin: clammy Head: NCAT sclera not icteric MMM Neck: Supple.  Lungs:  Bilateral crackles to mid lung - no overt WOB Heart:  Tachy regular  . Midline scar from CABG Abdomen: obese soft NT + BS Lower extremities: 1+ LE ankle/foot edema, petechial changes bilateral LE mid calf to ankle with ^ varicosities; feet cool Neuro: A & O  X 3. Moves all extremities spontaneously. Psych:  Responds to questions appropriately with a normal affect. Dialysis Access:left AVF + bruit  Dialysis Orders: MWF [place] 4.25 HR EDW 112.5 left AVF 450/800 EDW 112.5 K 2  Ca 2.24 AVF profile 4 heparin none parsabiv 2.5 venofer 50/week no ESA calcitriol 1.25 heparin ?  (HD orders from prior March 2019 admission) Tells me current EDW is 116  Assessment/Plan: 1. N, V, with hyperglycemia/mild hyperkalemia- elevated beta hydroxybutryin Glu 426 K 5.5  CO2 25 , lipase 32; Ca gluconate given - if we get glucose down, K will  follow - SSI per primary; trigger post lunch yesterday - he thinks it is reminiscent of prior gastroparesis. 2. ESRD -  MWF - will contact his dialysis unit in the am for current HD orders - use 2 K 2 Ca bath 3. Hypertension/volume  - hx of poorly controlled HTN/excess volume-admission CXR shows mild vascular congestions and he is very hypertensive - had initial bolus of IVF - now KVO 4. Anemia  - hgb 10.8 - trend - obtain outside ESA/Fe orders in the am 5. Metabolic bone disease - obtain current dialysis calcitriol orders in the am- barsabiv not available here  6. Nutrition - needs renal carb mod/fluid restriction 7. Depression /anxiety - current meds 8. CM - EF 25 - 35%\ 9. Pulmonary edema - tells me he is getting to EDW with cramping near the end- suspect that is more related to high UF goal.  Try to titrate EDW down further while -here;  I think he is ok to wait until first round HD in the am. 10. DM per primary 11. CDIP- last IVIG a weak ago with plans to start mycophenolate 12. LE erythema - seems more due to petechiae than cellulitis - lower edema - follow- WBC is 9.5 - he has not had a temp recorded and does not feel hot to the  touch.  [name], PA-C [facility] [phone] 05/20/2017, 6:07 PM   Pt seen, examined and agree w A/P as above.  [name] MD [employer] pager [phone]   05/20/2017, 9:14 PM

## 2017-05-21 ENCOUNTER — Encounter (HOSPITAL_COMMUNITY): Payer: Self-pay

## 2017-05-21 DIAGNOSIS — J9601 Acute respiratory failure with hypoxia: Secondary | ICD-10-CM

## 2017-05-21 DIAGNOSIS — Z992 Dependence on renal dialysis: Secondary | ICD-10-CM

## 2017-05-21 DIAGNOSIS — Z79899 Other long term (current) drug therapy: Secondary | ICD-10-CM

## 2017-05-21 DIAGNOSIS — I5020 Unspecified systolic (congestive) heart failure: Secondary | ICD-10-CM

## 2017-05-21 DIAGNOSIS — Z794 Long term (current) use of insulin: Secondary | ICD-10-CM

## 2017-05-21 DIAGNOSIS — R7989 Other specified abnormal findings of blood chemistry: Secondary | ICD-10-CM

## 2017-05-21 DIAGNOSIS — J811 Chronic pulmonary edema: Secondary | ICD-10-CM

## 2017-05-21 DIAGNOSIS — L299 Pruritus, unspecified: Secondary | ICD-10-CM

## 2017-05-21 DIAGNOSIS — Z951 Presence of aortocoronary bypass graft: Secondary | ICD-10-CM

## 2017-05-21 DIAGNOSIS — E1122 Type 2 diabetes mellitus with diabetic chronic kidney disease: Secondary | ICD-10-CM

## 2017-05-21 DIAGNOSIS — Z7982 Long term (current) use of aspirin: Secondary | ICD-10-CM

## 2017-05-21 DIAGNOSIS — I447 Left bundle-branch block, unspecified: Secondary | ICD-10-CM

## 2017-05-21 DIAGNOSIS — I132 Hypertensive heart and chronic kidney disease with heart failure and with stage 5 chronic kidney disease, or end stage renal disease: Principal | ICD-10-CM

## 2017-05-21 DIAGNOSIS — K210 Gastro-esophageal reflux disease with esophagitis: Secondary | ICD-10-CM

## 2017-05-21 LAB — RENAL FUNCTION PANEL
Albumin: 3.3 g/dL — ABNORMAL LOW (ref 3.5–5.0)
Anion gap: 22 — ABNORMAL HIGH (ref 5–15)
BUN: 57 mg/dL — ABNORMAL HIGH (ref 6–20)
CO2: 25 mmol/L (ref 22–32)
Calcium: 8.7 mg/dL — ABNORMAL LOW (ref 8.9–10.3)
Chloride: 87 mmol/L — ABNORMAL LOW (ref 101–111)
Creatinine, Ser: 10.94 mg/dL — ABNORMAL HIGH (ref 0.61–1.24)
GFR calc Af Amer: 6 mL/min — ABNORMAL LOW (ref >60)
GFR calc non Af Amer: 5 mL/min — ABNORMAL LOW (ref >60)
Glucose, Bld: 129 mg/dL — ABNORMAL HIGH (ref 65–99)
Phosphorus: 8.7 mg/dL — ABNORMAL HIGH (ref 2.5–4.6)
Potassium: 4.5 mmol/L (ref 3.5–5.1)
Sodium: 134 mmol/L — ABNORMAL LOW (ref 135–145)

## 2017-05-21 LAB — BASIC METABOLIC PANEL
Anion gap: 22 — ABNORMAL HIGH (ref 5–15)
BUN: 59 mg/dL — ABNORMAL HIGH (ref 6–20)
CO2: 27 mmol/L (ref 22–32)
CREATININE: 10.93 mg/dL — AB (ref 0.61–1.24)
Calcium: 9 mg/dL (ref 8.9–10.3)
Chloride: 88 mmol/L — ABNORMAL LOW (ref 101–111)
GFR calc Af Amer: 6 mL/min — ABNORMAL LOW (ref >60)
GFR, EST NON AFRICAN AMERICAN: 5 mL/min — AB (ref >60)
Glucose, Bld: 97 mg/dL (ref 65–99)
Potassium: 5 mmol/L (ref 3.5–5.1)
SODIUM: 137 mmol/L (ref 135–145)

## 2017-05-21 LAB — CBC
HCT: 28.8 % — ABNORMAL LOW (ref 39.0–52.0)
Hemoglobin: 9.5 g/dL — ABNORMAL LOW (ref 13.0–17.0)
MCH: 30.1 pg (ref 26.0–34.0)
MCHC: 33 g/dL (ref 30.0–36.0)
MCV: 91.1 fL (ref 78.0–100.0)
Platelets: 258 10*3/uL (ref 150–400)
RBC: 3.16 MIL/uL — ABNORMAL LOW (ref 4.22–5.81)
RDW: 14.6 % (ref 11.5–15.5)
WBC: 12.6 10*3/uL — ABNORMAL HIGH (ref 4.0–10.5)

## 2017-05-21 LAB — GLUCOSE, CAPILLARY
GLUCOSE-CAPILLARY: 87 mg/dL (ref 65–99)
GLUCOSE-CAPILLARY: 109 mg/dL — AB (ref 65–99)
GLUCOSE-CAPILLARY: 158 mg/dL — AB (ref 65–99)
Glucose-Capillary: 114 mg/dL — ABNORMAL HIGH (ref 65–99)
Glucose-Capillary: 238 mg/dL — ABNORMAL HIGH (ref 65–99)
Glucose-Capillary: 253 mg/dL — ABNORMAL HIGH (ref 65–99)

## 2017-05-21 LAB — MRSA PCR SCREENING: MRSA BY PCR: NEGATIVE

## 2017-05-21 LAB — TROPONIN I: TROPONIN I: 0.07 ng/mL — AB (ref <0.03)

## 2017-05-21 MED ORDER — HYDROCORTISONE 1 % EX CREA
TOPICAL_CREAM | Freq: Three times a day (TID) | CUTANEOUS | Status: DC | PRN
  Administered 2017-05-21: 23:00:00 via TOPICAL
  Filled 2017-05-21 (×2): qty 28

## 2017-05-21 MED ORDER — INSULIN ASPART 100 UNIT/ML ~~LOC~~ SOLN
0.0000 [IU] | Freq: Three times a day (TID) | SUBCUTANEOUS | Status: DC
  Administered 2017-05-21: 5 [IU] via SUBCUTANEOUS
  Administered 2017-05-22: 2 [IU] via SUBCUTANEOUS
  Administered 2017-05-22 – 2017-05-24 (×5): 3 [IU] via SUBCUTANEOUS
  Administered 2017-05-24: 2 [IU] via SUBCUTANEOUS
  Administered 2017-05-24: 3 [IU] via SUBCUTANEOUS

## 2017-05-21 MED ORDER — TRAMADOL HCL 50 MG PO TABS
50.0000 mg | ORAL_TABLET | Freq: Once | ORAL | Status: AC
  Administered 2017-05-21: 50 mg via ORAL
  Filled 2017-05-21: qty 1

## 2017-05-21 MED ORDER — PANTOPRAZOLE SODIUM 40 MG PO TBEC
40.0000 mg | DELAYED_RELEASE_TABLET | Freq: Two times a day (BID) | ORAL | Status: DC
  Administered 2017-05-21 – 2017-05-25 (×8): 40 mg via ORAL
  Filled 2017-05-21 (×8): qty 1

## 2017-05-21 MED ORDER — METOCLOPRAMIDE HCL 5 MG PO TABS: 5.0000 mg | ORAL_TABLET | Freq: Three times a day (TID) | ORAL | Status: DC | PRN

## 2017-05-21 MED ORDER — OXYCODONE HCL 5 MG PO TABS
5.0000 mg | ORAL_TABLET | Freq: Once | ORAL | Status: AC
  Administered 2017-05-21: 5 mg via ORAL
  Filled 2017-05-21: qty 1

## 2017-05-21 NOTE — Progress Notes (Signed)
[facility] NEPHROLOGY PROGRESS NOTE  Assessment/ Plan: Pt is a 47 y.o. 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  Who presented with N, V, hyperglycemia, elevated K.  Last dialysis was Friday in [place].  Assessment/Plan:  # ESRD: Receiving dialysis today.  Lower ultrafiltration to 2 to 2.5 kg. Orders: 2K, 2 ca, 450/800, AVF  # Anemia: Hb 9.5-10/8, monitor.   # Secondary hyperparathyroidism: continue sensipar, ca 8.7, phos 3.3  # HTN/volume: Blood pressure acceptable.  Continue metoprolol.  Subjective: Seen and examined during dialysis.  Reported mild nausea but no vomiting. Objective Vital signs in last 24 hours: Vitals:   05/21/17 0830 05/21/17 0900 05/21/17 0930 05/21/17 1000  BP: (!) 142/90 (!) 142/98 133/87 134/86  Pulse: 95 96 96 93  Resp:      Temp:      TempSrc:      SpO2:      Weight:      Height:       Weight change:   Intake/Output Summary (Last 24 hours) at 05/21/2017 1053 Last data filed at 05/21/2017 0600 Gross per 24 hour  Intake 600 ml  Output 0 ml  Net 600 ml       Labs: Basic Metabolic Panel: Recent Labs  Lab 05/20/17 1316 05/21/17 0522 05/21/17 0817  NA 132* 137 134*  K 5.5* 5.0 4.5  CL 87* 88* 87*  CO2 GLUCOSE 426* 97 129*  BUN 49* 59* 57*  CREATININE 10.01* 10.93* 10.94*  CALCIUM 9.2 9.0 8.7*  PHOS  --   --  8.7*   Liver Function Tests: Recent Labs  Lab 05/20/17 1316 05/21/17 0817  AST 20  --   ALT 18  --   ALKPHOS 77  --   BILITOT 1.1  --   PROT 7.3  --   ALBUMIN 4.0 3.3*   Recent Labs  Lab 05/20/17 1316  LIPASE 32   No results for input(s): AMMONIA in the last 168 hours. CBC: Recent Labs  Lab 05/20/17 1316 05/21/17 0817  WBC 9.5 12.6*  HGB 10.8* 9.5*  HCT 32.4* 28.8*  MCV 90.8 91.1  PLT 250 258   Cardiac Enzymes: Recent Labs  Lab 05/20/17 1451 05/20/17 2205  05/21/17 0522  TROPONINI 0.05* 0.06* 0.07*   CBG: Recent Labs  Lab 05/20/17 1302 05/20/17 1651 05/21/17 0057 05/21/17 0507 05/21/17 0922  GLUCAP 379* 385* 253* 87 109*    Iron Studies: No results for input(s): IRON, TIBC, TRANSFERRIN, FERRITIN in the last 72 hours. Studies/Results: Dg Chest Portable 1 View  Result Date: 05/20/2017 CLINICAL DATA:  Nausea and vomiting since yesterday. Dialysis patient. EXAM: PORTABLE CHEST 1 VIEW COMPARISON:  03/19/2017 FINDINGS: Sternotomy wires are unchanged. Lungs are adequately inflated without focal airspace consolidation or effusion. Minimal prominence of the central perihilar markings which may indicate a mild degree of vascular congestion. Cardiomediastinal silhouette and remainder of the exam is unchanged. IMPRESSION: Possible mild vascular congestion. Electronically Signed   By: Elberta Fortis M.D.   On: 05/20/2017 14:26    Medications: Infusions:   Scheduled Medications: . aspirin  81 mg Oral Daily  . atorvastatin  80 mg Oral q1800  . cinacalcet  30 mg Oral Q M,W,F  . DULoxetine  30 mg Oral Daily  . fluticasone  1 spray Each Nare Daily  . gabapentin  200 mg Oral TID  . heparin  5,000  Units Subcutaneous Q8H  . insulin aspart  0-15 Units Subcutaneous Q4H  . insulin glargine  15 Units Subcutaneous BID  . levothyroxine  50 mcg Oral QAC breakfast  . metoprolol succinate  37.5 mg Oral BID    have reviewed scheduled and prn medications.  Physical Exam: General:NAD, comfortable Heart:RRR, s1s2 nl Lungs:clear b/l, no cracjle Abdomen:soft, Non-tender, non-distended Extremities:No edema Dialysis Access: Left AV fistula has good bruit and thrill.  Antoinette Haskett Prasad Taven [facility] 05/21/2017,10:53 AM  LOS: 1 day

## 2017-05-21 NOTE — Progress Notes (Signed)
Patient complaining of pain despite ultram order from MD. MD notified and made aware. Orders to be placed. Will continue to monitor.

## 2017-05-21 NOTE — Progress Notes (Signed)
  PT Cancellation Note  Patient Details Name: Russell [name] MRN: [id number] DOB: 1970/04/19   Cancelled Treatment:    Reason Eval/Treat Not Completed: (P) Patient at procedure or test/unavailable Pt in HD. PT will follow back this afternoon for Eval.   Nance Mccombs B. Beverely Risen PT, DPT Acute Rehabilitation  [phone] Pager [phone]     Russell [name] Fleet 05/21/2017, 9:53 AM

## 2017-05-21 NOTE — Evaluation (Signed)
Physical Therapy Evaluation Patient Details Name: Russell [name] MRN: [id number] DOB: [birth year] Today's Date: 05/21/2017   History of Present Illness  47 y.o. 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  Who presented with N, V, hyperglycemia, elevated K. Dx with Acute hypoxic respiratory failure, esophageal dysphagia, diabetic gastroparesis  Clinical Impression  PTA pt ambulated limited community distances with cane, and able to perform most ADLS, and iADLs. Pt previously receiving HHPT/OT for CIDP. Pt currently limited in safe mobility by DoE, decreased strength, decreased balance and increased nerve pain in hands and feet. Pt is mod I for bed mobility and min guard for transfers, min A for ambulation of 50 feet with heavy use of handrails in hallway. PT recommends HHPT at d/c to improve strength and balance to improve safe mobility in his home environment. PT will continue to follow acutely.      Follow Up Recommendations Home health PT;Supervision/Assistance - 24 hour(neuro based PT)    Equipment Recommendations  None recommended by PT    Recommendations for Other Services OT consult     Precautions / Restrictions Precautions Precautions: Fall Restrictions Weight Bearing Restrictions: No      Mobility  Bed Mobility Overal bed mobility: Modified Independent             General bed mobility comments: use of bedrail to come to EoB  Transfers Overall transfer level: Needs assistance Equipment used: None Transfers: Sit to/from Stand Sit to Stand: Min guard         General transfer comment: min guard for safety, had to stand for a minute to steady himself before ambulation  Ambulation/Gait Ambulation/Gait assistance: Min assist Ambulation Distance (Feet): 50 Feet Assistive device: None(use of handrails in hallway) Gait Pattern/deviations: Step-through  pattern;Ataxic;Staggering left;Staggering right;Wide base of support;Decreased dorsiflexion - right;Decreased dorsiflexion - left Gait velocity: slowed Gait velocity interpretation: <1.8 ft/sec, indicate of risk for recurrent falls General Gait Details: minA for steadying with ambulation, pt prefers without AD however with heavy use of handrails in hallway, requires 1x standing rest break for DoE       Balance Overall balance assessment: Needs assistance Sitting-balance support: No upper extremity supported;Feet supported Sitting balance-Leahy Scale: Fair     Standing balance support: No upper extremity supported;During functional activity Standing balance-Leahy Scale: Fair Standing balance comment: able to static stand wound require support for dynamic balance                             Pertinent Vitals/Pain Pain Assessment: 0-10 Pain Score: 9  Pain Location: hands and feet Pain Descriptors / Indicators: Pins and needles;Stabbing Pain Intervention(s): Limited activity within patient's tolerance;Monitored during session;Repositioned    Home Living Family/patient expects to be discharged to:: Private residence Living Arrangements: Parent Available Help at Discharge: Family;Available 24 hours/day Type of Home: House Home Access: Stairs to enter Entrance Stairs-Rails: None Entrance Stairs-Number of Steps: 1 Home Layout: One level Home Equipment: Cane - single point;Bedside commode(over toilet)      Prior Function Level of Independence: Independent with assistive device(s)         Comments: ambulates limit community ambulation      Hand Dominance   Dominant Hand: Right    Extremity/Trunk Assessment   Upper Extremity Assessment Upper Extremity Assessment: Defer to OT evaluation    Lower Extremity Assessment Lower Extremity Assessment: RLE deficits/detail;LLE deficits/detail  RLE Deficits / Details: no sensation below mid calf, ROM of hip and knee  grossly WFL, limited ankle dorsiflexion, strength of hip and knee grossly 4/5 with MMT causes pain RLE: Unable to fully assess due to pain RLE Sensation: decreased light touch;history of peripheral neuropathy;decreased proprioception(CIDP) RLE Coordination: decreased gross motor LLE Deficits / Details: no sensation below mid calf, ROM of hip and knee grossly WFL, limited ankle dorsiflexion, strength of hip and knee grossly 3+/5 with MMT causes pain LLE: Unable to fully assess due to pain LLE Sensation: decreased light touch;history of peripheral neuropathy;decreased proprioception(CIDP) LLE Coordination: decreased gross motor       Communication   Communication: No difficulties  Cognition Arousal/Alertness: Awake/alert Behavior During Therapy: WFL for tasks assessed/performed Overall Cognitive Status: Within Functional Limits for tasks assessed                                        General Comments General comments (skin integrity, edema, etc.): Pt on 3 L O2 via nasal cannula at entry SaO2 98%O2, HR 101 bpm, with ambulation requires standing rest break SaO2 99%O2 despite 3/4 DoE, HR 119 bpm        Assessment/Plan    PT Assessment Patient needs continued PT services  PT Problem List Decreased strength;Decreased range of motion;Decreased activity tolerance;Decreased balance;Decreased mobility;Decreased coordination;Decreased safety awareness;Cardiopulmonary status limiting activity;Impaired sensation;Pain       PT Treatment Interventions DME instruction;Gait training;Functional mobility training;Therapeutic activities;Therapeutic exercise;Balance training;Neuromuscular re-education;Patient/family education    PT Goals (Current goals can be found in the Care Plan section)  Acute Rehab PT Goals Patient Stated Goal: stop coming to the hospital  PT Goal Formulation: With patient Time For Goal Achievement: 06/04/17 Potential to Achieve Goals: Fair    Frequency Min  3X/week    AM-PAC PT "6 Clicks" Daily Activity  Outcome Measure Difficulty turning over in bed (including adjusting bedclothes, sheets and blankets)?: A Little Difficulty moving from lying on back to sitting on the side of the bed? : A Little Difficulty sitting down on and standing up from a chair with arms (e.g., wheelchair, bedside commode, etc,.)?: Unable Help needed moving to and from a bed to chair (including a wheelchair)?: A Little Help needed walking in hospital room?: A Little Help needed climbing 3-5 steps with a railing? : A Lot 6 Click Score: 15    End of Session Equipment Utilized During Treatment: Gait belt;Oxygen Activity Tolerance: Patient limited by fatigue;Patient limited by pain Patient left: in bed;with call bell/phone within reach Nurse Communication: Mobility status PT Visit Diagnosis: Unsteadiness on feet (R26.81);Other abnormalities of gait and mobility (R26.89);History of falling (Z91.81);Muscle weakness (generalized) (M62.81);Difficulty in walking, not elsewhere classified (R26.2);Ataxic gait (R26.0);Other symptoms and signs involving the nervous system (R29.898);Pain Pain - Right/Left: (bilateral) Pain - part of body: Hand;Ankle and joints of foot    Time: 1538-1600 PT Time Calculation (min) (ACUTE ONLY): 22 min   Charges:         PT G Codes:        Latrisa Hellums B. Beverely Risen PT, DPT Acute Rehabilitation  [phone] Pager [phone]    Elon Alas [facility] 05/21/2017, 4:38 PM

## 2017-05-21 NOTE — Care Management Note (Addendum)
Case Management Note  Patient Details  Name: Russell [name] MRN: [id number] Date of Birth: 1970-08-13  Subjective/Objective:   History of DM, gastroparesis, ESRD on HD MWF, CIDP, HFrEF (EF 25-30%), CAD s/p CABG in 2017, HTN, and hypothyroidism.  Admitted for Pulmonary edema secondary to volume overload.  PCP noted.  Action/Plan: Prior to admission patient lived at home.  Home DME: Cane - single point;Bedside commode(over toilet)At discharge plans to return to same living situation.  In to speak with patient, discussed therapy home health recommendations, offered patient choice.  Patient selected [facility].  Referral for [facility] PT/OT called to discharge liaison for [facility] and accepted.  NCM will continue to follow for any discharge planning needs.  Expected Discharge Date:  05/23/17               Expected Discharge Plan:   home with home health  In-House Referral:   n/a Discharge planning Services   CM consult Post Acute Care Choice:  Home Health Choice offered to:  Patient  HH Arranged:  PT, OT HH Agency:  [facility] Status of Service:   In progress, will continue to monitor.  Yancey Flemings, RN 05/21/2017, 4:27 PM

## 2017-05-21 NOTE — Progress Notes (Addendum)
Subjective:  Russell [name] was doing better this morning. He reports improvement in his breathing and chest pain. He has not had recurrence of his emesis or diarrhea while here. He does continue to have abdominal soreness and nausea. He also tells [place] that he has had worsening dysphagia to both solids and liquids in the past few weeks. He feels that food gets stuck in his mid-esophagus and will eventually traverse down into his stomach, but it takes some time. He also complains of an itchy rash on his back, which appeared over the weekend.  Objective:  Vital signs in last 24 hours: Vitals:   05/20/17 1800 05/20/17 1830 05/20/17 2158 05/21/17 0445  BP: (!) 159/95 (!) 170/99 (!) 189/125 (!) 148/105  Pulse: 96 (!) 101 (!) 108 (!) 102  Resp: Temp:   97.8 F (36.6 C) 97.9 F (36.6 C)  TempSrc:   Oral Oral  SpO2: 100% 100% 100% 97%  Height:    (1.88 m)    GEN: Lying in HD in NAD CV: NR & RR, no m/r/g PULM: CTAB, no wheezes or rales ABD: Diffuse soreness to palpation, normal BS, soft, obese MSK: Trace BLE edema. LUE access for HD. BACK: Multiple small erythematous lesions on lower back, no evidence of skin breakdown  Assessment/Plan:  Active Problems:   Pulmonary edema  Russell [name] is a 47yo male with PMH of DM, gastroparesis, ESRD on HD MWF, CIDP, HFrEF (EF 25-30%), CAD s/p CABG, HTN, and hypothyroidism who presents with 1 day of abdominal soreness, N/V/D, and subsequent development of CP and SOB. His abdominal soreness and N/V/D are improved and suggestive of flare of patient's known gastroparesis. Will restart home reglan. His SOB is likely related to pulmonary edema secondary to volume overload, currently receiving dialysis. He also brings up a new complaint of dysphagia for the last few months this morning.  Esophageal dysphagia Endorses dysphagia to both solids and liquids. He has had no weight loss since his admission in March 2019. Denies hematemesis or hemoptysis.  EGD in 2016 showed severe erosive esophagitis thought to be secondary to reflux. Barium swallow in 03/2017 showed poor esophageal motility without stricture or mass, as well as silent aspiration. Also had a PET/CT without etiology for dysphagia. Discussed with on-call GI who feel he does not require inpatient evaluation with EGD, and can follow up as an outpatient. Advised to start PPI BID. - Outpatient f/u with GI - Start PO pantoprazole  BID  Diabetic Gastroparesis flare Symptoms improved this morning. Restarting home metoclopramide and will continue to monitor. - Small frequent meals - Restart home metoclopramide  q8h PRN - Holding home semaglutide - Norco q4h PRN for moderate pain  Acute hypoxic respiratory failure Pulmonary edema Hx of HFrEF (TTE 03/2017: EF 25-30%) Volume status managed with dialysis. Getting dialysis today. - Nephrology consulted; appreciate their assistance - Continue home metoprolol 37.5mg  BID - Dialysis, as below  Pruritic rash on back No skin breakdown. - Topical steroids PRN  New LBBB, Troponinemia Hx of CAD s/p CABG in 2017 Recent cath 03/2017 performed for newly reduced EF showed patent grafts and likely nonischemic/microvascular etiology. Endorses chest pressure, exacerbated by emesis. Troponins flat at 0.07. Likely demand secondary to volume overload. - Telemetry - Continue home aspirin  daily and atorvastatin  QHS - Dialysis, as below  ESRD on HD MWF HD today, plan for repeat dialysis tomorrow. - Nephrology consulted; appreciate their assistance - HD today and tomorrow  Hx of  CIDP Receiving monthly IVIG (last treatment about a week ago) and planning to start mycophenolate, has not yet started. - Norco q4h PRN for moderate pain  Hx of DM Home regimen includes Novolog 8-20 units TID WC, glargine 30 units BID, and semaglutide 0.5mg  qweek. BG 87-158. - Lantus 15u BID - Novolog SSI-M q4h - CBG monitoring  Dispo: Anticipated  discharge in approximately 1-2 day(s).   Scherrie Gerlach, MD 05/21/2017, 6:28 AM Pager: Demetrius Charity [phone]

## 2017-05-21 NOTE — Progress Notes (Signed)
Patient complaining of pain unrelieved by current PRN's. MD notified and made aware. Will continue to monitor.

## 2017-05-22 DIAGNOSIS — Z8669 Personal history of other diseases of the nervous system and sense organs: Secondary | ICD-10-CM

## 2017-05-22 DIAGNOSIS — G8929 Other chronic pain: Secondary | ICD-10-CM

## 2017-05-22 DIAGNOSIS — E02 Subclinical iodine-deficiency hypothyroidism: Secondary | ICD-10-CM

## 2017-05-22 LAB — CBC WITH DIFFERENTIAL/PLATELET
ABS IMMATURE GRANULOCYTES: 0 10*3/uL (ref 0.0–0.1)
Basophils Absolute: 0.1 10*3/uL (ref 0.0–0.1)
Basophils Relative: 1 %
Eosinophils Absolute: 0.4 10*3/uL (ref 0.0–0.7)
Eosinophils Relative: 5 %
HEMATOCRIT: 30.3 % — AB (ref 39.0–52.0)
HEMOGLOBIN: 9.6 g/dL — AB (ref 13.0–17.0)
Immature Granulocytes: 1 %
LYMPHS ABS: 1.5 10*3/uL (ref 0.7–4.0)
LYMPHS PCT: 18 %
MCH: 30.4 pg (ref 26.0–34.0)
MCHC: 31.7 g/dL (ref 30.0–36.0)
MCV: 95.9 fL (ref 78.0–100.0)
MONO ABS: 0.9 10*3/uL (ref 0.1–1.0)
MONOS PCT: 11 %
NEUTROS ABS: 5.2 10*3/uL (ref 1.7–7.7)
Neutrophils Relative %: 64 %
Platelets: 241 10*3/uL (ref 150–400)
RBC: 3.16 MIL/uL — ABNORMAL LOW (ref 4.22–5.81)
RDW: 14.9 % (ref 11.5–15.5)
WBC: 8.1 10*3/uL (ref 4.0–10.5)

## 2017-05-22 LAB — GLUCOSE, CAPILLARY
GLUCOSE-CAPILLARY: 154 mg/dL — AB (ref 65–99)
GLUCOSE-CAPILLARY: 202 mg/dL — AB (ref 65–99)
Glucose-Capillary: 132 mg/dL — ABNORMAL HIGH (ref 65–99)
Glucose-Capillary: 190 mg/dL — ABNORMAL HIGH (ref 65–99)

## 2017-05-22 MED ORDER — METOCLOPRAMIDE HCL 5 MG PO TABS
5.0000 mg | ORAL_TABLET | Freq: Three times a day (TID) | ORAL | Status: DC
  Administered 2017-05-22 – 2017-05-24 (×6): 5 mg via ORAL
  Filled 2017-05-22 (×6): qty 1

## 2017-05-22 MED ORDER — GABAPENTIN 100 MG PO CAPS
200.0000 mg | ORAL_CAPSULE | Freq: Every day | ORAL | Status: DC
  Administered 2017-05-22 – 2017-05-24 (×3): 200 mg via ORAL
  Filled 2017-05-22 (×3): qty 2

## 2017-05-22 MED ORDER — CALCITRIOL 0.25 MCG PO CAPS
1.2500 ug | ORAL_CAPSULE | ORAL | Status: DC
  Administered 2017-05-23 – 2017-05-25 (×2): 1.25 ug via ORAL
  Filled 2017-05-22: qty 1

## 2017-05-22 MED ORDER — CALCIUM ACETATE (PHOS BINDER) 667 MG PO CAPS
2001.0000 mg | ORAL_CAPSULE | Freq: Three times a day (TID) | ORAL | Status: DC
  Administered 2017-05-22 – 2017-05-25 (×8): 2001 mg via ORAL
  Filled 2017-05-22 (×9): qty 3

## 2017-05-22 MED ORDER — DARBEPOETIN ALFA 100 MCG/0.5ML IJ SOSY
100.0000 ug | PREFILLED_SYRINGE | INTRAMUSCULAR | Status: DC
  Administered 2017-05-23: 100 ug via INTRAVENOUS
  Filled 2017-05-22: qty 0.5

## 2017-05-22 NOTE — Progress Notes (Addendum)
Subjective:  No sob , tolerated HD yest.  Objective Vital signs in last 24 hours: Vitals:   05/21/17 2132 05/22/17 0500 05/22/17 0519 05/22/17 0801  BP: 117/61 (!) 136/112 129/61 (!) 104/92  Pulse: 94 95 78 76  Resp: Temp: 98.2 F (36.8 C) 97.7 F (36.5 C)  97.9 F (36.6 C)  TempSrc: Oral Oral  Oral  SpO2: 99% 99%  99%  Weight:      Height:       Weight change:   Physical Exam: General: Alert Obese wm nad  Heart: RRR, No m,r,g Lungs: CTA bilat Abdomen: Obese, soft, NT, ND Extremities:No  pedal edema  Dialysis Access: pos bruit LUA AVF    OP Dialysis Orders: MWF [place] 4.25 HR EDW 112.5 left AVF 450/800 EDW 116, K 2 Ca 2.25, uf  profile 4 .heparin none. parsabiv 5, venofer 50/week ,Mircera 75 mcg (last given 05/02/17 )  calcitriol 1.25 mcg po q hd   Problem/Plan: 1.  Hyperkalemia/ Resolved with HD and correction of Hyperglycemia  2. ESRD - HD on Schedule MWF  Ok for dc per renal; standpoint 3. Anemia - hgb 9.6 Restart ESA  Next hd Aranesp  100 if  here  4. Secondary hyperparathyroidism - Phos 8.7/corexc ca 9.3  yest  CW his op noncompliance with diet and phos binders = phoslo ac continue  5. HTN/volume / HO CM  25-35% - UF 2199 ml yest  To 114kg  And below edw now , )2 sat 99 % ,no sob currently lower edw at DC  6. Anxiety  / Depression = On meds ,anxious about dc today  Admit team rx  7. Gastroparesis - resovled Rx per admti team  8. DM type  2 - per admit    Russell Pastel, PA-C [facility] Beeper [phone] 05/22/2017,10:33 AM  LOS: 2 days   Labs: Basic Metabolic Panel: Recent Labs  Lab 05/20/17 1316 05/21/17 0522 05/21/17 0817  NA 132* 137 134*  K 5.5* 5.0 4.5  CL 87* 88* 87*  CO2 GLUCOSE 426* 97 129*  BUN 49* 59* 57*  CREATININE 10.01* 10.93* 10.94*  CALCIUM 9.2 9.0 8.7*  PHOS  --   --  8.7*   Liver Function Tests: Recent Labs  Lab 05/20/17 1316 05/21/17 0817  AST 20  --   ALT 18  --   ALKPHOS 77  --   BILITOT  1.1  --   PROT 7.3  --   ALBUMIN 4.0 3.3*   Recent Labs  Lab 05/20/17 1316  LIPASE 32   No results for input(s): AMMONIA in the last 168 hours. CBC: Recent Labs  Lab 05/20/17 1316 05/21/17 0817 05/22/17 0555  WBC 9.5 12.6* 8.1  NEUTROABS  --   --  5.2  HGB 10.8* 9.5* 9.6*  HCT 32.4* 28.8* 30.3*  MCV 90.8 91.1 95.9  PLT 250 258 241   Cardiac Enzymes: Recent Labs  Lab 05/20/17 1451 05/20/17 2205 05/21/17 0522  TROPONINI 0.05* 0.06* 0.07*   CBG: Recent Labs  Lab 05/21/17 0922 05/21/17 1310 05/21/17 1701 05/21/17 2203 05/22/17 0801  GLUCAP 109* 158* 238* 114* 154*    Studies/Results: Dg Chest Portable 1 View  Result Date: 05/20/2017 CLINICAL DATA:  Nausea and vomiting since yesterday. Dialysis patient. EXAM: PORTABLE CHEST 1 VIEW COMPARISON:  03/19/2017 FINDINGS: Sternotomy wires are unchanged. Lungs are adequately inflated without focal airspace consolidation or effusion. Minimal prominence of the central perihilar markings which  may indicate a mild degree of vascular congestion. Cardiomediastinal silhouette and remainder of the exam is unchanged. IMPRESSION: Possible mild vascular congestion. Electronically Signed   By: Elberta Fortis M.D.   On: 05/20/2017 14:26   Medications:  . aspirin  81 mg Oral Daily  . atorvastatin  80 mg Oral q1800  . cinacalcet  30 mg Oral Q M,W,F  . DULoxetine  30 mg Oral Daily  . fluticasone  1 spray Each Nare Daily  . gabapentin  200 mg Oral QHS  . heparin  5,000 Units Subcutaneous Q8H  . insulin aspart  0-15 Units Subcutaneous TID WC  . insulin glargine  15 Units Subcutaneous BID  . levothyroxine  50 mcg Oral QAC breakfast  . metoCLOPramide  5 mg Oral Q8H  . metoprolol succinate  37.5 mg Oral BID  . pantoprazole  40 mg Oral BID AC

## 2017-05-22 NOTE — Progress Notes (Signed)
Attempting to wean pt to room air.  Pt is 100% on room air lying in bed at this time.  Pt feels short of breath and dizzy.  Pt restarted O2 @  2lpm via [place].  Pt stated feels shaky when standing.

## 2017-05-22 NOTE — Progress Notes (Signed)
Subjective:  Russell [name] reported improvement in his breathing. He continues to endorse abdominal soreness and nausea, but tolerated breakfast this morning without issue. States that it takes time for the food to pass, but it did eventually settle. No recurrence of emesis or diarrhea. Improvement in pruritus on his back with topical steroids.  Objective:  Vital signs in last 24 hours: Vitals:   05/21/17 2003 05/21/17 2132 05/22/17 0500 05/22/17 0519  BP: (!) 100/59 117/61 (!) 136/112 129/61  Pulse: 95 94 95 78  Resp: Temp: 99.4 F (37.4 C) 98.2 F (36.8 C) 97.7 F (36.5 C)   TempSrc: Oral Oral Oral   SpO2: 100% 99% 99%   Weight: 251 lb 5.2 oz (114 kg)     Height:       GEN: Lying in bed in NAD CV: NR & RR, no m/r/g PULM: CTAB, no wheezes or rales. LUE AVF ABD: Mild soreness to palpation diffusely, normal BS, soft, obese MSK: Trace BLE edema. BACK: Improvement in pruritic rash on back with topical steroids  Assessment/Plan:  Active Problems:   Pulmonary edema  Russell [name] is a 47yo male with PMH of DM, gastroparesis, ESRD on HD MWF, CIDP, HFrEF (EF 25-30%), CAD s/p CABG, HTN, and hypothyroidism who presents with 1 day of abdominal soreness, N/V/D, and subsequent development of CP and SOB. We are currently treating for a flare of his gastroparesis with scheduled home reglan. His respiratory status has improved with managing his volume through dialysis. Pharmacy discussed starting Cymbalta in the morning and gabapentin in the evening to determine if this would help his chronic pain. Patient prefers to remain in the hospital for this, as he is concerned about serotonin syndrome. Provided reassurance. Will ensure patient has PT/OT eval prior to discharge.  Diabetic Gastroparesis flare Symptoms improving, on home metoclopramide. Tolerating PO intake. - Small frequent meals - Continue home metoclopramide  q8h - Holding home semaglutide - Norco q4h PRN for moderate  pain  Esophageal dysphagia To solids and liquids. Recent imaging shows no evidence of an esophageal mass or stricture. Discussed with on-call GI who state he is okay for follow up with GI to get EGD. Started PPI BID per GI recommendations. - Outpatient f/u with GI - Continue PO pantoprazole  BID  Hx of CIDP Receiving monthly IVIG (last treatment about a week ago) and planning to start mycophenolate, has not yet started. Providing reassurance regarding effects of serotonin syndrome. - Norco q4h PRN for moderate pain - Cymbalta qAM and gabapentin  QHS for pain - Outpatient f/u  Acute hypoxic respiratory failure 2/2 volume overload, improved Hx of HFrEF (TTE 03/2017: EF 25-30%) Improved after dialysis yesterday. - Nephrology consulted; appreciate their assistance - Continue home metoprolol 37.5mg  BID - Dialysis, as below - Wean supplemental oxygen as tolerated  Pruritic rash on back, improved - Topical hydrocortisone cream PRN  Hx of CAD s/p CABG in 2017 Recent cath 03/2017 performed for newly reduced EF showed patent grafts and likely nonischemic/microvascular etiology. Troponinemia on admission, likely secondary to demand ischemia. - Telemetry - Continue home aspirin  daily and atorvastatin  QHS  ESRD on HD MWF - Nephrology consulted; appreciate their assistance - HD tomorrow  Hx of DM Home regimen includes Novolog 8-20 units TID WC, glargine 30 units BID, and semaglutide 0.5mg  qweek. BG 114-154 - Lantus 15u BID - Novolog SSI-M q4h - CBG monitoring  Dispo: Anticipated discharge in approximately 0-1 day(s).   Scherrie Gerlach, MD 05/22/2017,  7:09 AM Pager: Demetrius Charity [phone]

## 2017-05-22 NOTE — Progress Notes (Signed)
Went to see patient after RN report that while working with OT, patient's HR dropped to 42 and O2 saturation dropped to 60. On re-evaluation, patient was working with PT. Orthostatics were negative and he was not hypoxic on RA with standing or sitting. Patient did endorse shaking and weak legs that made it difficult for him to stand for a long period of time, which is different than how he felt prior to admission. He states he does typically have burning and tingling in his hands and feet, as well as bilateral foot drop, however he did not have more proximal weakness prior to admission or when he was first diagnosed with CIDP. Patient endorses more difficulty breathing while lying flat, as opposed to sitting up.  Patient reports that his last IVIG treatment was a week ago and that the effects typically wear off after about 3 weeks. Plan with outpatient neurology was to start mycophenolate, however patient had not yet started this due to insurance issues. Steroids were not started due to patient's hx of diabetes.  On exam, patient was not using accessory muscles on room air. Proximal muscles of LE ~4+/5 compared to 3/5 in more distal muscles. He has burning pain of bilateral feet, as well as 1+ swelling of his right foot. No calf swelling bilaterally.  Given hx of CIDP, this was discussed with on-call Neurologist who felt that his symptoms may be related to diabetic autonomic neuropathy. He did suggest possibly administering a dose of IVIG to assess for clinical improvement. Wells' score is 0, suggesting low likelihood of PE. Patient has a history of subclinical hypothyroidism based on recent labwork, which is less likely a cause for his acute weakness. He does have heart failure, with volume status being managed by dialysis - he denies any missed sessions and did receive HD while inpatient yesterday according to his typical schedule. Alternative explanation could be related to deconditioning, for which PT/OT  eval and recommendations are appreciated. Will await PT recs regarding dispo plan and whether he may benefit from discharge to SNF for further recovery.

## 2017-05-22 NOTE — Progress Notes (Signed)
OT in with patient states pts legs were shakey up standing with walker.  HR dropped to 42 X 2 and pulse was faint at that time.  Also while standing and talking O2 sat dropped to 60 but recovered to 93% while standing.

## 2017-05-22 NOTE — Progress Notes (Signed)
Paged Internal Medicine [phone]

## 2017-05-22 NOTE — Evaluation (Signed)
Occupational Therapy Evaluation Patient Details Name: Russell [name] MRN: [id number] DOB: 28-Jan-1970 Today's Date: 05/22/2017    History of Present Illness 47 y.o. 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  Who presented with N, V, hyperglycemia, elevated K. Dx with Acute hypoxic respiratory failure, esophageal dysphagia, diabetic gastroparesis   Clinical Impression   Pt admitted with acute respiratory failure. Pt currently with functional limitations due to the deficits listed below (see OT Problem List).  Pt will benefit from skilled OT to increase their safety and independence with ADL and functional mobility for ADL to facilitate discharge to venue listed below.      Follow Up Recommendations  Home health OT    Equipment Recommendations  None recommended by OT    Recommendations for Other Services       Precautions / Restrictions Precautions Precautions: Fall Restrictions Weight Bearing Restrictions: No      Mobility Bed Mobility Overal bed mobility: Modified Independent             General bed mobility comments: use of bedrail to come to EoB  Transfers Overall transfer level: Needs assistance Equipment used: None Transfers: Sit to/from Stand Sit to Stand: Min guard;Min assist         General transfer comment: min guard for safety, had to stand for a minute to steady himself before ambulation    Balance Overall balance assessment: Needs assistance Sitting-balance support: No upper extremity supported;Feet supported Sitting balance-Leahy Scale: Fair     Standing balance support: No upper extremity supported;During functional activity Standing balance-Leahy Scale: Fair Standing balance comment: able to static stand wound require support for dynamic balance                           ADL either performed or assessed with  clinical judgement   ADL Overall ADL's : Needs assistance/impaired Eating/Feeding: Minimal assistance;Sitting   Grooming: Minimal assistance;Sitting   Upper Body Bathing: Minimal assistance;Sitting   Lower Body Bathing: Moderate assistance;Sit to/from stand;Cueing for sequencing;Cueing for safety   Upper Body Dressing : Minimal assistance;Standing   Lower Body Dressing: Maximal assistance;Sit to/from stand;Cueing for sequencing;Cueing for safety   Toilet Transfer: Moderate assistance;Comfort height toilet;RW   Toileting- Clothing Manipulation and Hygiene: Moderate assistance;Sit to/from stand               Vision Patient Visual Report: No change from baseline       Perception     Praxis      Pertinent Vitals/Pain Pain Location: hands and feet Pain Descriptors / Indicators: Pins and needles;Stabbing Pain Intervention(s): Limited activity within patient's tolerance;Monitored during session;Patient requesting pain meds-RN notified     Hand Dominance Right   Extremity/Trunk Assessment Upper Extremity Assessment Upper Extremity Assessment: Generalized weakness;LUE deficits/detail;RUE deficits/detail RUE Sensation: decreased light touch RUE Coordination: decreased fine motor LUE Sensation: decreased light touch LUE Coordination: decreased fine motor    Pt reports decreased sensation both hands including hands, wrist and lower arms         Communication Communication Communication: No difficulties   Cognition Arousal/Alertness: Awake/alert Behavior During Therapy: WFL for tasks assessed/performed Overall Cognitive Status: Within Functional Limits for tasks assessed  General Comments  Pt stood up and HR 42 and oxygen 72. Upon sitting HR 80 and oxygen 95 on room air            Home Living Family/patient expects to be discharged to:: Private residence Living Arrangements: Parent Available Help at  Discharge: Family;Available 24 hours/day Type of Home: House Home Access: Stairs to enter [employer] of Steps: 1 Entrance Stairs-Rails: None Home Layout: One level     Bathroom Shower/Tub: Chief Strategy Officer: Standard Bathroom Accessibility: Yes   Home Equipment: Cane - single point;Bedside commode(over toilet)   Additional Comments: log cabin,       Prior Functioning/Environment Level of Independence: Independent with assistive device(s)        Comments: ambulates limit community ambulation         OT Problem List: Decreased strength;Decreased activity tolerance;Impaired balance (sitting and/or standing);Decreased safety awareness;Decreased coordination;Decreased knowledge of use of DME or AE;Pain;Impaired sensation;Cardiopulmonary status limiting activity      OT Treatment/Interventions: Self-care/ADL training;Patient/family education;DME and/or AE instruction;Balance training    OT Goals(Current goals can be found in the care plan section) Acute Rehab OT Goals Patient Stated Goal: stop coming to the hospital  OT Goal Formulation: With patient Time For Goal Achievement: 05/29/17  OT Frequency: Min 2X/week   Barriers to D/C:               AM-PAC PT "6 Clicks" Daily Activity     Outcome Measure Help from another person eating meals?: A Little Help from another person taking care of personal grooming?: A Little Help from another person toileting, which includes using toliet, bedpan, or urinal?: A Lot Help from another person bathing (including washing, rinsing, drying)?: A Little Help from another person to put on and taking off regular upper body clothing?: A Little Help from another person to put on and taking off regular lower body clothing?: A Lot 6 Click Score: 16   End of Session Equipment Utilized During Treatment: Rolling walker Nurse Communication: Mobility status  Activity Tolerance: Patient tolerated treatment  well Patient left: in bed;with call bell/phone within reach  OT Visit Diagnosis: Unsteadiness on feet (R26.81);Muscle weakness (generalized) (M62.81);History of falling (Z91.81);Repeated falls (R29.6)                Time: 8657-8469 OT Time Calculation (min): 26 min Charges:  OT General Charges $OT Visit: 1 Visit OT Evaluation $OT Eval Moderate Complexity: 1 Mod OT Treatments $Self Care/Home Management : 8-22 mins G-Codes:     Lise Auer, OT [phone]  Einar Crow D 05/22/2017, 12:26 PM

## 2017-05-22 NOTE — Progress Notes (Signed)
Discussed Cymbalta with patient at bedside.  Patient was concerned with interactions with other medications he is taking. I assured him he is not currently on a regimen that is extremely concerning. We briefly discussed serotonin syndrome as he had heard of this and had questions about it.  We discussed the plan to change his gabapentin to  at bedtime with Cymbalta in the morning. He expressed willingness to try this, and did state he would prefer to be in the hospital to see how this regimen works for him. He stated a desire to get off some of his medications that he "doesn't need anymore" or "don't work" in order to simplify his home regimen.  I left patient information about Cymbalta at the bedside and told him I would discuss what we talked about with the team as well.  Russell [name], PharmD, BCPS Clinical Pharmacist [phone] 05/22/2017 10:19 AM

## 2017-05-22 NOTE — Progress Notes (Signed)
Internal Medicine Dr. Renaldo Reel returned page.  Advised of pt condition, no new orders at this time.

## 2017-05-22 NOTE — Discharge Summary (Signed)
Name: Russell [name] MRN: [id number] DOB: 1970-04-11 47 y.o. PCP: [name], MD  Date of Admission: 05/20/2017  1:00 PM Date of Discharge: 05/25/2017 Attending Physician: Tyson Alias, *  Discharge Diagnosis: 1. Diabetic gastroparesis 2. Pulmonary edema 3. Hx of CIDP 4. ESRD on HD 5. Esophageal dysphagia  Active Problems:   ESRD (end stage renal disease) (HCC)   Nausea and vomiting   Pulmonary edema   Diabetes mellitus type 2 in obese [facility])   Diabetic peripheral neuropathy (HCC)   Diabetic gastroparesis (HCC)   Hypothyroidism   Anemia of chronic disease   Acute blood loss anemia   Abnormal EKG   Dysphagia   Supplemental oxygen dependent   Discharge Medications: Allergies as of 05/25/2017      Reactions   Nsaids Other (See Comments)   Told to avoid all nsaids due to kidney disease    Tape Other (See Comments)   Welts result, if left for a long amount of time      Medication List    TAKE these medications   acetaminophen 500 MG tablet Commonly known as:  TYLENOL Take 1 tablet (500 mg total) by mouth every 6 (six) hours as needed for moderate pain. What changed:    how much to take  reasons to take this   albuterol 108 (90 Base) MCG/ACT inhaler Commonly known as:  PROVENTIL HFA;VENTOLIN HFA Inhale 1 puff into the lungs every 6 (six) hours as needed for wheezing or shortness of breath. What changed:    how much to take  when to take this   aspirin 81 MG EC tablet Take 1 tablet (81 mg total) by mouth daily.   atorvastatin 80 MG tablet Commonly known as:  LIPITOR Take 1 tablet (80 mg total) by mouth daily at 6 PM.   BASAGLAR KWIKPEN 100 UNIT/ML Sopn Inject 30 units 2x a day under skin What changed:    how much to take  how to take this  when to take this  additional instructions   cinacalcet 30 MG tablet Commonly known as:  SENSIPAR Take 30 mg by mouth every Monday, Wednesday, and Friday.   cyclobenzaprine 10 MG  tablet Commonly known as:  FLEXERIL Take 1 tablet (10 mg total) by mouth 3 (three) times daily as needed for muscle spasms.   DULoxetine 30 MG capsule Commonly known as:  CYMBALTA Take 1 capsule (30 mg total) by mouth daily.   ferric gluconate 62.5 mg in sodium chloride 0.9 % 100 mL Inject 62.5 mg into the vein every Wednesday with hemodialysis.   fluticasone 50 MCG/ACT nasal spray Commonly known as:  FLONASE Place 1 spray into both nostrils daily.   gabapentin 100 MG capsule Commonly known as:  NEURONTIN Take 2 capsules (200 mg total) by mouth at bedtime. What changed:  when to take this   hydrALAZINE 25 MG tablet Commonly known as:  APRESOLINE Take 1 tablet (25 mg total) by mouth 3 (three) times daily. What changed:    when to take this  reasons to take this   HYDROcodone-acetaminophen 5-325 MG tablet Commonly known as:  NORCO/VICODIN Take 1-2 tablets by mouth every 4 (four) hours as needed for moderate pain.   insulin aspart 100 UNIT/ML FlexPen Commonly known as:  NOVOLOG FLEXPEN Inject 8-20 Units into the skin 3 (three) times daily with meals. What changed:  additional instructions   levothyroxine 50 MCG tablet Commonly known as:  SYNTHROID, LEVOTHROID Take 1 tablet (50 mcg total) by  mouth daily before breakfast.   metoCLOPramide 5 MG tablet Commonly known as:  REGLAN Take 1 tablet (5 mg total) by mouth 2 times daily at 12 noon and 4 pm. What changed:    when to take this  reasons to take this   metoprolol succinate 25 MG 24 hr tablet Commonly known as:  TOPROL-XL Take 1.5 tablets (37.5 mg total) by mouth 2 (two) times daily.   mycophenolate 250 MG capsule Commonly known as:  CELLCEPT Take 1 capsule (250 mg total) by mouth 2 (two) times daily.   pantoprazole 40 MG tablet Commonly known as:  PROTONIX Take 1 tablet (40 mg total) by mouth 2 (two) times daily before a meal.   PRIVIGEN 40 GM/400ML Soln Generic drug:  Immune Globulin (Human) Inject 40 g  into the vein every 30 (thirty) days. 40gm/dayx 5 days and then 40gm q4 weeks   Semaglutide 0.25 or 0.5 MG/DOSE Sopn Commonly known as:  OZEMPIC Inject 0.5 mg into the skin once a week.   sucralfate 1 g tablet Commonly known as:  CARAFATE Take 1 tablet (1 g total) by mouth 4 (four) times daily. What changed:    when to take this  reasons to take this      Disposition and follow-up:   [name] was discharged from [facility] in Stable condition.  At the hospital follow up visit please address:  1.  CIDP - Ensure outpatient f/u with Neurology and to continue receiving IVIG and mycophenolate  2.  Diabetes - Please continue working on glycemic control. Poorly controlled diabetes likely contributing to his neuropathy and lower extremity weakness.  3.  Dysphagia - Please ensure follow up with outpatient GI.  4.  Labs / imaging needed at time of follow-up: Outpatient EGD  5.  Pending labs/ test needing follow-up: None  Follow-up Appointments: Follow-up Information    [name], MD Follow up.   Specialty:  Family Medicine Contact information: [address] [place] [address] [phone]        [facility] Follow up.   Contact information: [address] [place] [address] [phone]        [facility] Follow up.   Specialty:  Home Health Services Why:  They will call you to set up initial visit Contact information: [address] [place] [address] [phone]          Hospital Course by problem list: Active Problems:   ESRD (end stage renal disease) (HCC)   Nausea and vomiting   Pulmonary edema   Diabetes mellitus type 2 in obese [facility])   Diabetic peripheral neuropathy (HCC)   Diabetic gastroparesis (HCC)   Hypothyroidism   Anemia of chronic disease   Acute blood loss anemia   Abnormal EKG   Dysphagia   Supplemental oxygen dependent   1. Diabetic  gastroparesis Patient admitted with 1 day of abdominal pain and N/V. With supportive therapy, his symptoms improved and he had no recurrence of emesis or diarrhea. His reglan was continued and he was able to tolerate PO intake. Home reglan dose adjusted to appropriate renal dosing.  2. Lower extremity weakness While in the hospital, patient endorsed increased proximal LE muscle weakness that made it difficult for him to stand and for him to work with PT/OT. He relayed that this was a change compared to his prior symptoms, which were mostly limited to bilateral foot drop and numbness. Lower extremity weakness likely multifactorial  related to deconditioning and diabetic peripheral neuropathy. Less likely progression/flare of CIDP, as patient recently received IVIG about a week prior to admission. Evaluated by PT/OT, who recommended discharge to CIR.  3. Pulmonary edema Patient received IVF while in the emergency room and developed increased shortness of breath with hypoxia, requiring oxygen. His acute shortness of breath was felt to be due to pulmonary edema in the setting of HFrEF. His volume status was managed with hemodialysis with improvement in his respiratory status. Not hypoxic on room air, however was using 2L for comfort.  4. CIDP Patient carries a diagnosis of CIDP and follows with Dr. Anne Hahn at [facility]. He was receiving IVIG every 4 weeks, however due to its effects wearing off in 3 weeks, the plan was to increase the frequency of his IVIG treatments to every 3 weeks. He was also set to start mycophenolate, however had not started this yet due to issues with insurance. Steroids were not started as an outpatient due to concern for diabetes. Patient will need outpatient follow up.  5. Esophageal dysphagia Patient complained of weeks of esophageal dysphagia to both solids and liquids that had worsened recently. Patient able to tolerate PO intake, but reported that it takes him longer for the food to  pass. He has had no weight loss since his admission in March 2019. Denies hematemesis or hemoptysis. EGD in 2016 showed severe erosive esophagitis thought to be secondary to reflux. Barium swallow in 03/2017 showed poor esophageal motility without stricture or mass, as well as silent aspiration. Also had a PET/CT without etiology for dysphagia. Discussed with on-call GI who feel he does not require inpatient evaluation with EGD. GI recommended starting PO PPI BID and continued outpatient follow-up.  6. ESRD on HD Nephrology was consulted while inpatient and patient received dialysis on his typical schedule.  7. Uncontrolled diabetes Continue outpatient management of diabetes to help with glycemic control and neuropathy. Glycemic control is felt to be very important to help with his peripheral neuropathy. Gabapentin changed to QHS to match appropriate renal dosing and duloxetine to be administered in the morning.  8. Troponinemia, Hx of CAD s/p CABG in 2017 EKG on admission showed possible new LBBB. Troponins were trended, and were flat at 0.07. Patient had a recent cath in 03/2017 for his newly reduced EF, which showed patent grafts and likely nonischemic/microvascular etiology.  Discharge Vitals:   BP 117/66 (BP Location: Right Arm)   Pulse 80   Temp 98.3 F (36.8 C) (Oral)   Resp 18   Ht  (1.88 m)   Wt 259 lb 0.7 oz (117.5 kg)   SpO2 100%   BMI 33.26 kg/m   Pertinent Labs, Studies, and Procedures:  CBC Latest Ref Rng & Units 05/25/2017 05/23/2017 05/22/2017  WBC 4.0 - 10.5 K/uL 8.1 7.2 8.1  Hemoglobin 13.0 - 17.0 g/dL 1.6(X) 0.9(U) 0.4(V)  Hematocrit 39.0 - 52.0 % 29.1(L) 24.9(L) 30.3(L)  Platelets 150 - 400 K/uL 258 281 241   CMP Latest Ref Rng & Units 05/25/2017 05/23/2017 05/21/2017  Glucose 65 - 99 mg/dL 92 409(W) 119(J)  BUN 6 - 20 mg/dL 47(W) 29(F) 62(Z)  Creatinine 0.61 - 1.24 mg/dL 3.08(M) 57.84(O) 96.29(B)  Sodium 135 - 145 mmol/L 129(L) 133(L) 134(L)  Potassium 3.5 - 5.1  mmol/L 5.0 5.2(H) 4.5  Chloride 101 - 111 mmol/L 90(L) 93(L) 87(L)  CO2 22 - 32 mmol/L Calcium 8.9 - 10.3 mg/dL 9.2 2.8(U) 1.3(K)  Total Protein 6.5 -  8.1 g/dL - - -  Total Bilirubin 0.3 - 1.2 mg/dL - - -  Alkaline Phos 38 - 126 U/L - - -  AST 15 - 41 U/L - - -  ALT 17 - 63 U/L - - -   Troponin 0.05 -> 0.06 -> 0.07 Lipase 32  CXR 05/20/2017 Possible mild vascular congestion.  Discharge Instructions: Discharge Instructions    Diet - low sodium heart healthy   Complete by:  As directed    Discharge instructions   Complete by:  As directed    Mr. Barco,  While you were here, we treated you for a flare of your gastroparesis. We changed your reglan to the right dosing for people on dialysis. Please take reglan  twice a day  Please take duloxetine in the morning and gabapentin once at night.  For your trouble swallowing, we talked to the stomach doctors here and they recommended you continue to follow up as an outpatient in the clinic. Please take protonix  twice a day.  For your lower extremity weakness, it is important to continue working on your strength at the rehab center. It is also important to help take care of your diabetes because this might be contributing to your weakness. Please make sure to schedule a follow up appointment with your primary doctor when you leave the inpatient rehab center.  Please continue to follow up with neurology so that they can treat your CIDP.   Increase activity slowly   Complete by:  As directed      Signed: Scherrie Gerlach, MD 05/25/2017, 2:14 PM   Pager: Demetrius Charity [phone]

## 2017-05-22 NOTE — Progress Notes (Signed)
Physical Therapy Treatment Patient Details Name: Russell [name] MRN: [id number] DOB: [birth year] Today's Date: 05/22/2017    History of Present Illness 47 y.o. 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  Who presented with N, V, hyperglycemia, elevated K. Dx with Acute hypoxic respiratory failure, esophageal dysphagia, diabetic gastroparesis    PT Comments    PT evaluated dizziness with BP's and O2 sats being taken.  Pt has distal weakness and high BP's with effort, as well as 8/10 pain on limbs with O2 sats supported on 2L O2.  Pt is appropriate for further work on strengthening, balance and to manage his pain with poorer standing stability now.  Follow acutely for same.   Follow Up Recommendations  CIR     Equipment Recommendations  None recommended by PT    Recommendations for Other Services Rehab consult     Precautions / Restrictions Precautions Precautions: Fall Restrictions Weight Bearing Restrictions: No    Mobility  Bed Mobility Overal bed mobility: Modified Independent             General bed mobility comments: railiing to sit up  Transfers Overall transfer level: Needs assistance Equipment used: Rolling walker (2 wheeled) Transfers: Sit to/from Stand Sit to Stand: Mod assist;Max assist         General transfer comment: dense assistance with elevation of bed  Ambulation/Gait             General Gait Details: unable to step   Stairs             Wheelchair Mobility    Modified Rankin (Stroke Patients Only)       Balance     Sitting balance-Leahy Scale: Good       Standing balance-Leahy Scale: Poor                              Cognition Arousal/Alertness: Awake/alert Behavior During Therapy: WFL for tasks assessed/performed Overall Cognitive Status: Within Functional Limits for tasks assessed                                        Exercises      General Comments General comments (skin integrity, edema, etc.): BP sitting 123/88, standing 124/94;  O2 sat with 2L O2 was 99% but with O2 removed dropped to 91%.  Resting elevated to 95% so left O2 off      Pertinent Vitals/Pain Pain Assessment: 0-10 Pain Score: 8  Pain Location: hands, feet, stomach,  Pain Descriptors / Indicators: Pins and needles;Stabbing Pain Intervention(s): Limited activity within patient's tolerance;Monitored during session;Repositioned    Home Living Family/patient expects to be discharged to:: Private residence Living Arrangements: Parent Available Help at Discharge: Family;Available 24 hours/day Type of Home: House Home Access: Stairs to enter Entrance Stairs-Rails: None Home Layout: One level Home Equipment: Cane - single point;Bedside commode Additional Comments: log cabin,     Prior Function Level of Independence: Independent with assistive device(s)      Comments: SPC recently with neuropathy   PT Goals (current goals can now be found in the care plan section) Acute Rehab PT Goals Patient Stated Goal: to feel stronger PT Goal Formulation: With patient Time For Goal Achievement: 06/05/17 Potential to Achieve Goals: Fair    Frequency  Min 3X/week      PT Plan      Co-evaluation              AM-PAC PT "6 Clicks" Daily Activity  Outcome Measure  Difficulty turning over in bed (including adjusting bedclothes, sheets and blankets)?: A Little Difficulty moving from lying on back to sitting on the side of the bed? : A Little Difficulty sitting down on and standing up from a chair with arms (e.g., wheelchair, bedside commode, etc,.)?: Unable Help needed moving to and from a bed to chair (including a wheelchair)?: A Lot Help needed walking in hospital room?: Total Help needed climbing 3-5 steps with a railing? : Total 6 Click Score: 11    End of Session  Equipment Utilized During Treatment: Gait belt;Oxygen Activity Tolerance: Patient limited by fatigue;Treatment limited secondary to medical complications (Comment) Patient left: in bed;with call bell/phone within reach;with nursing/sitter in room Nurse Communication: Mobility status PT Visit Diagnosis: Unsteadiness on feet (R26.81);Other abnormalities of gait and mobility (R26.89);History of falling (Z91.81);Muscle weakness (generalized) (M62.81);Difficulty in walking, not elsewhere classified (R26.2);Ataxic gait (R26.0);Other symptoms and signs involving the nervous system (R29.898);Pain Pain - Right/Left: (Blateral) Pain - part of body: Ankle and joints of foot     Time: 1321-1349 PT Time Calculation (min) (ACUTE ONLY): 28 min  Charges:  $Therapeutic Activity: 8-22 mins                    G Codes:  Functional Assessment Tool Used: AM-PAC 6 Clicks Basic Mobility    Ivar Drape 05/22/2017, 8:22 PM   Samul Dada, PT MS Acute Rehab Dept. Number: [facility] [id number] and [facility] [phone]

## 2017-05-23 DIAGNOSIS — E669 Obesity, unspecified: Secondary | ICD-10-CM

## 2017-05-23 DIAGNOSIS — D62 Acute posthemorrhagic anemia: Secondary | ICD-10-CM

## 2017-05-23 DIAGNOSIS — R1310 Dysphagia, unspecified: Secondary | ICD-10-CM

## 2017-05-23 DIAGNOSIS — E1142 Type 2 diabetes mellitus with diabetic polyneuropathy: Secondary | ICD-10-CM

## 2017-05-23 DIAGNOSIS — E039 Hypothyroidism, unspecified: Secondary | ICD-10-CM

## 2017-05-23 DIAGNOSIS — E1169 Type 2 diabetes mellitus with other specified complication: Secondary | ICD-10-CM

## 2017-05-23 DIAGNOSIS — Z9981 Dependence on supplemental oxygen: Secondary | ICD-10-CM

## 2017-05-23 DIAGNOSIS — E1143 Type 2 diabetes mellitus with diabetic autonomic (poly)neuropathy: Secondary | ICD-10-CM

## 2017-05-23 DIAGNOSIS — K3184 Gastroparesis: Secondary | ICD-10-CM

## 2017-05-23 DIAGNOSIS — R9431 Abnormal electrocardiogram [ECG] [EKG]: Secondary | ICD-10-CM

## 2017-05-23 DIAGNOSIS — D638 Anemia in other chronic diseases classified elsewhere: Secondary | ICD-10-CM

## 2017-05-23 DIAGNOSIS — R0601 Orthopnea: Secondary | ICD-10-CM

## 2017-05-23 LAB — CBC
HEMATOCRIT: 24.9 % — AB (ref 39.0–52.0)
Hemoglobin: 8 g/dL — ABNORMAL LOW (ref 13.0–17.0)
MCH: 30.2 pg (ref 26.0–34.0)
MCHC: 32.1 g/dL (ref 30.0–36.0)
MCV: 94 fL (ref 78.0–100.0)
Platelets: 281 10*3/uL (ref 150–400)
RBC: 2.65 MIL/uL — AB (ref 4.22–5.81)
RDW: 14.6 % (ref 11.5–15.5)
WBC: 7.2 10*3/uL (ref 4.0–10.5)

## 2017-05-23 LAB — BASIC METABOLIC PANEL
ANION GAP: 14 (ref 5–15)
BUN: 54 mg/dL — AB (ref 6–20)
CO2: 26 mmol/L (ref 22–32)
Calcium: 8.7 mg/dL — ABNORMAL LOW (ref 8.9–10.3)
Chloride: 93 mmol/L — ABNORMAL LOW (ref 101–111)
Creatinine, Ser: 10.34 mg/dL — ABNORMAL HIGH (ref 0.61–1.24)
GFR calc Af Amer: 6 mL/min — ABNORMAL LOW (ref >60)
GFR calc non Af Amer: 5 mL/min — ABNORMAL LOW (ref >60)
GLUCOSE: 186 mg/dL — AB (ref 65–99)
POTASSIUM: 5.2 mmol/L — AB (ref 3.5–5.1)
Sodium: 133 mmol/L — ABNORMAL LOW (ref 135–145)

## 2017-05-23 LAB — GLUCOSE, CAPILLARY
GLUCOSE-CAPILLARY: 105 mg/dL — AB (ref 65–99)
Glucose-Capillary: 180 mg/dL — ABNORMAL HIGH (ref 65–99)
Glucose-Capillary: 187 mg/dL — ABNORMAL HIGH (ref 65–99)

## 2017-05-23 MED ORDER — LIDOCAINE-PRILOCAINE 2.5-2.5 % EX CREA: 1.0000 "application " | TOPICAL_CREAM | CUTANEOUS | Status: DC | PRN

## 2017-05-23 MED ORDER — ALTEPLASE 2 MG IJ SOLR: 2.0000 mg | Freq: Once | INTRAMUSCULAR | Status: DC | PRN

## 2017-05-23 MED ORDER — SODIUM CHLORIDE 0.9 % IV SOLN: 100.0000 mL | INTRAVENOUS | Status: DC | PRN

## 2017-05-23 MED ORDER — HEPARIN SODIUM (PORCINE) 1000 UNIT/ML DIALYSIS: 1000.0000 [IU] | INTRAMUSCULAR | Status: DC | PRN

## 2017-05-23 MED ORDER — LIDOCAINE HCL (PF) 1 % IJ SOLN: 5.0000 mL | INTRAMUSCULAR | Status: DC | PRN

## 2017-05-23 MED ORDER — DARBEPOETIN ALFA 100 MCG/0.5ML IJ SOSY
PREFILLED_SYRINGE | INTRAMUSCULAR | Status: AC
  Filled 2017-05-23: qty 0.5

## 2017-05-23 MED ORDER — ALBUMIN HUMAN 25 % IV SOLN
INTRAVENOUS | Status: AC
  Filled 2017-05-23: qty 100

## 2017-05-23 MED ORDER — CALCITRIOL 0.25 MCG PO CAPS
ORAL_CAPSULE | ORAL | Status: AC
  Filled 2017-05-23: qty 5

## 2017-05-23 MED ORDER — ALBUMIN HUMAN 25 % IV SOLN
25.0000 g | Freq: Once | INTRAVENOUS | Status: AC
  Administered 2017-05-23: 25 g via INTRAVENOUS

## 2017-05-23 MED ORDER — PENTAFLUOROPROP-TETRAFLUOROETH EX AERO: 1.0000 "application " | INHALATION_SPRAY | CUTANEOUS | Status: DC | PRN

## 2017-05-23 NOTE — Progress Notes (Signed)
Subjective:  Russell [name] was seen in dialysis this morning. He reports improvement in his breathing since admission. He continues to endorse bilateral foot and hand burning pain that is similar to his chronic pain.   Objective:  Vital signs in last 24 hours: Vitals:   05/22/17 1559 05/22/17 2104 05/22/17 2232 05/23/17 0453  BP: 111/76 105/71  99/60  Pulse: 84 81  77  Resp: Temp: 98.2 F (36.8 C) 98.1 F (36.7 C)  98 F (36.7 C)  TempSrc: Oral Oral  Oral  SpO2: 99% 100%  98%  Weight:   261 lb 11 oz (118.7 kg)   Height:       GEN: Lying in bed in NAD in HD CV: NR & RR, no m/r/g PULM: CTAB, no wheezes or rales. ABD: normal BS, soft, obese MSK: Trace BLE edema  Assessment/Plan:  Active Problems:   Pulmonary edema  Russell [name] is a 47yo male with PMH of DM, gastroparesis, ESRD on HD MWF, CIDP, HFrEF (EF 25-30%), CAD s/p CABG, HTN, and hypothyroidism who presents with 1 day of abdominal soreness, N/V/D, and subsequent development of CP and SOB. We are currently treating for a flare of his gastroparesis with scheduled home reglan. His respiratory status has improved with managing his volume through dialysis. While working with PT/OT yesterday, it was noted that patient had increased LE weakness and was unable to stand on his own. Therapy recommends discharge to CIR, with which patient is agreeable. CIR consulted.  LE weakness Differential includes deconditioning vs progression of CIDP. Last treatment of IVIG was one week ago and patient reports the benefit typically lasts for about 3 weeks. Has not yet started mycophenolate. PT/OT recommends discharge to CIR, with which patient is amenable. - CIR consulted - F/u with Neurology outpatient for continued management of CIDP  Diabetic Gastroparesis flare Symptoms improving, on home metoclopramide. Tolerating PO intake. - Small frequent meals - Continue home metoclopramide  q8h - Holding home semaglutide - Norco q4h PRN  for moderate pain  Esophageal dysphagia Recent imaging shows no evidence of an esophageal mass or stricture. Discussed with on-call GI who state he is okay for follow up with GI to get EGD. Started PPI BID per GI recommendations. - Outpatient f/u with GI - Continue PO pantoprazole  BID  Hx of CIDP Receiving monthly IVIG (last treatment about a week ago) and planning to start mycophenolate, has not yet started. - Norco q4h PRN for moderate pain - Cymbalta qAM and gabapentin  QHS for pain - Outpatient f/u with Neurology  Volume overload, improved after dialysis Hx of HFrEF (TTE 03/2017: EF 25-30%) Saturating well on RA - Nephrology consulted; appreciate their assistance - Continue home metoprolol 37.5mg  BID - Dialysis, as below - Wean supplemental oxygen as tolerated  Pruritic rash on back, improved - Topical hydrocortisone cream PRN  Hx of CAD s/p CABG in 2017 Recent cath 03/2017 performed for newly reduced EF showed patent grafts and likely nonischemic/microvascular etiology. Troponinemia on admission, likely secondary to demand ischemia. - Telemetry - Continue home aspirin  daily and atorvastatin  QHS  ESRD on HD MWF - Nephrology consulted; appreciate their assistance - HD today  Hx of DM Home regimen includes Novolog 8-20 units TID WC, glargine 30 units BID, and semaglutide 0.5mg  qweek. BG 114-154 - Lantus 15u BID - Novolog SSI-M q4h - CBG monitoring  Dispo: Anticipated discharge in approximately 0-1 day(s).   Scherrie Gerlach, MD 05/23/2017, 6:35 AM Pager: Demetrius Charity  [phone] 

## 2017-05-23 NOTE — Progress Notes (Signed)
Inpatient Diabetes Program Recommendations  AACE/ADA: New Consensus Statement on Inpatient Glycemic Control (2015)  Target Ranges:  Prepandial:   less than 140 mg/dL      Peak postprandial:   less than 180 mg/dL (1-2 hours)      Critically ill patients:  140 - 180 mg/dL  Results for JAMISON, SOWARD" (MRN [id number]) as of 05/23/2017 12:00  Ref. Range 05/23/2017 08:28  Glucose Latest Ref Range: 65 - 99 mg/dL 213 (H)   Results for LEMAN, MARTINEK" (MRN [id number]) as of 05/23/2017 12:00  Ref. Range 05/22/2017 08:01 05/22/2017 11:48 05/22/2017 16:39 05/22/2017 21:00  Glucose-Capillary Latest Ref Range: 65 - 99 mg/dL 629 (H) 528 (H) 413 (H) 202 (H)   Review of Glycemic Control  Current orders for Inpatient glycemic control: Lantus 15 units BID, Novolog 0-15 units TID with meals  Inpatient Diabetes Program Recommendations: Correction (SSI): Please consider ordering Novolog 0-5 units QHS for bedtime correction. Insulin - Meal Coverage: Please consider orderin Novolog 3 units TID with meals for meal coverage if patient eats at least 50% of meals.  Thanks, Orlando Penner, RN, MSN, CDE Diabetes Coordinator Inpatient Diabetes Program [phone] (Team Pager from 8am to 5pm)

## 2017-05-23 NOTE — Progress Notes (Signed)
Internal Medicine Attending:   I saw and examined the patient. I reviewed the resident's note and I agree with the resident's findings and plan as documented in the resident's note. [place] improved.  SOB (orthopnea) yesterday- reports already improved (on HD with 1.5 L removed so far).  Otherwise no complaints. Agree with Dr Phylliss Bob plan. Will obtain CIR consult today.

## 2017-05-23 NOTE — Progress Notes (Signed)
Inpatient Rehabilitation  Per PT request, patient was screened by Foye Haggart for appropriateness for an Inpatient Acute Rehab consult.  At this time we are recommending an Inpatient Rehab consult.  Text paged MD to notify; please order if you are agreeable.    Kingsley Farace, M.A., CCC/SLP Admission Coordinator  [facility]  Cell [phone]  

## 2017-05-23 NOTE — Consult Note (Signed)
   [facility] Inpatient Consult   05/23/2017  Russell [name] 1970/01/16 [id number]  Patient was assessed for [facility] for community services for follow up. Patient was previously attempted for [place] [facility] outreach but staff was not able to maintain contact. Patient has HD scheduled listed as MWF. Patient currently being considered for inpatient rehab. Met with the patient at the bedside and he states, "I just want to get to the reason why I am so sick. I think rehab will be good for me and all of my doctors are here if I get sick."  Will follow for progress and disposition. Consent form is active on file.    He states is interested inpatient rehab and [facility] could follow closer to his transition home from rehab.  Please place consult/referral.   Of note, [facility] Care Management services does not replace or interfere with any services that are arranged by inpatient case management or social work. For additional questions or referrals please contact:  Natividad Brood, RN BSN [facility] Liaison  [phone] business mobile phone Toll free office [phone]

## 2017-05-23 NOTE — Progress Notes (Addendum)
Subjective:  Breathing better post Hd  with 2130 cc uf on hd today / noted for possible CIR  Objective Vital signs in last 24 hours: Vitals:   05/23/17 1100 05/23/17 1130 05/23/17 1142 05/23/17 1228  BP: 108/60 (!) 110/58 (!) 101/52 113/73  Pulse: 73 72 74 73  Resp: Temp:   97.7 F (36.5 C) 97.8 F (36.6 C)  TempSrc:   Oral Oral  SpO2:   100% 100%  Weight:   115.1 kg (253 lb 12 oz)   Height:       Weight change: 2.1 kg (4 lb 10.1 oz)  Physical Exam: General: Alert Obese wm nad  Heart: RRR, No m,r,g Lungs: CTA bilat Abdomen: Obese, soft, NT, ND Extremities:No  pedal edema  Dialysis Access: pos bruit LUA AVF    OP Dialysis Orders:MWFAshe 4.25 HR EDW 112.5left AVF450/800 EDW 116, K 2 Ca 2.25, uf  profile 4 .heparin none. parsabiv 5, venofer 50/week ,Mircera 75 mcg (last given 05/02/17 )  calcitriol 1.25 mcg po q hd   Problem/Plan: 1.  Hyperkalemia/ Resolved with HD and correction of Hyperglycemia  2. ESRD - HD on Schedule MWF  Ok for dc per renal; standpoint 3. Anemia - hgb 9.6>8.0  Restart ESA  Next hd Aranesp  100 if  here  4. Secondary hyperparathyroidism - Phos 8.7/corexc ca 9.3  yest  CW his op noncompliance with diet and phos binders = phoslo ac continue  5. HTN/volume / HO CM  25-35% - UF 2630 ml  Today   To 115kg  And below edw now ,no sob currently lower edw at DC  6. Anxiety  / Depression = On meds ,anxious about dc today  Admit team rx  7. Gastroparesis - resovled Rx per admti team  8. DM type  2 - per admit  9. LE weakness  - per admit team wu  =deconditioning vs progression of CIDP- On IVIG  As OP (one week ago infusion )and ICR  Admit ?   Russell Pastel, PA-C [facility] Beeper [phone] 05/23/2017,2:34 PM  LOS: 3 days   Labs: Basic Metabolic Panel: Recent Labs  Lab 05/21/17 0522 05/21/17 0817 05/23/17 0828  NA 137 134* 133*  K 5.0 4.5 5.2*  CL 88* 87* 93*  CO2 GLUCOSE 97 129* 186*  BUN 59* 57* 54*   CREATININE 10.93* 10.94* 10.34*  CALCIUM 9.0 8.7* 8.7*  PHOS  --  8.7*  --    Liver Function Tests: Recent Labs  Lab 05/20/17 1316 05/21/17 0817  AST 20  --   ALT 18  --   ALKPHOS 77  --   BILITOT 1.1  --   PROT 7.3  --   ALBUMIN 4.0 3.3*   Recent Labs  Lab 05/20/17 1316  LIPASE 32   No results for input(s): AMMONIA in the last 168 hours. CBC: Recent Labs  Lab 05/20/17 1316 05/21/17 0817 05/22/17 0555 05/23/17 0828  WBC 9.5 12.6* 8.1 7.2  NEUTROABS  --   --  5.2  --   HGB 10.8* 9.5* 9.6* 8.0*  HCT 32.4* 28.8* 30.3* 24.9*  MCV 90.8 91.1 95.9 94.0  PLT 250 258 241 281   Cardiac Enzymes: Recent Labs  Lab 05/20/17 1451 05/20/17 2205 05/21/17 0522  TROPONINI 0.05* 0.06* 0.07*   CBG: Recent Labs  Lab 05/22/17 0801 05/22/17 1148 05/22/17 1639 05/22/17 2100 05/23/17 1225  GLUCAP 154* 132* 190* 202* 180*  Studies/Results: No results found. Medications:  . aspirin  81 mg Oral Daily  . atorvastatin  80 mg Oral q1800  . calcitRIOL  1.25 mcg Oral Q M,W,F-HD  . calcium acetate  2,001 mg Oral TID WC  . cinacalcet  30 mg Oral Q M,W,F  . darbepoetin (ARANESP) injection - DIALYSIS  100 mcg Intravenous Q Wed-HD  . DULoxetine  30 mg Oral Daily  . fluticasone  1 spray Each Nare Daily  . gabapentin  200 mg Oral QHS  . heparin  5,000 Units Subcutaneous Q8H  . insulin aspart  0-15 Units Subcutaneous TID WC  . insulin glargine  15 Units Subcutaneous BID  . levothyroxine  50 mcg Oral QAC breakfast  . metoCLOPramide  5 mg Oral Q8H  . metoprolol succinate  37.5 mg Oral BID  . pantoprazole  40 mg Oral BID AC

## 2017-05-23 NOTE — Consult Note (Addendum)
Physical Medicine and Rehabilitation Consult   Reason for Consult: Debility Referring Physician: Dr. Renaldo Reel   HPI: Russell [name] is a 47 y.o. male with history of ESRD- HD MWF, CAD, T2DM--poorly controlled with gastroparesis and peripheral neuropathy, CIDP- monthly IVIG, gait disorder and asthma; who was admitted on 05/20/17 with N/V, SOB and left sided chest pain. History taken from chart review and patient. Last dialysis session on Friday before and he was found to have pulmonary edema, hyperkalemia and hyperglycemia. He was treated with IV calcium gluconate and HD with improvement in respiratory symptoms. He continues to have issues with abdominal discomfort and nausea as well as concerns about need for multiple medications. Therapy ongoing and CIR recommended due to functional deficits.   Review of Systems  Constitutional: Positive for malaise/fatigue. Negative for chills and fever.  Respiratory: Positive for shortness of breath.   Musculoskeletal: Positive for back pain and myalgias.  Neurological: Positive for sensory change and weakness.  All other systems reviewed and are negative.   Past Medical History:  Diagnosis Date  . Anxiety   . Asthma   . 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 Daily headache   . Depression   . DVT (deep venous thrombosis), H/o 01/2014-on Xarelto 03/24/2014   LLE  . ESRD (end stage renal disease) on dialysis [facility])    "[place], MWF" (06/29/2016)  . Gait abnormality 12/22/2016  . Gastroparesis 12/22/2016  . Hypertension   . Hypothyroidism   . Nephrotic syndrome 05/18/2014  . Neuropathy   . Type 2 diabetes mellitus with diabetic nephropathy [facility])     Past Surgical History:  Procedure Laterality Date  . ANKLE FRACTURE  SURGERY Right 1988  . AV FISTULA PLACEMENT Left 01/01/2015   Procedure: CREATION OF LEFT RADIAL CEPHALIC ARTERIOVENOUS (AV) FISTULA ;  Surgeon: Pryor Ochoa, MD;  Location: [facility] OR;  Service: Vascular;  Laterality: Left;  . CAPD REMOVAL N/A 05/19/2016   Procedure: PD CATH REMOVAL;  Surgeon: Abigail Miyamoto, MD;  Location: [facility] OR;  Service: General;  Laterality: N/A;  . CARDIAC CATHETERIZATION N/A 09/22/2015   Procedure: Left Heart Cath and Coronary Angiography;  Surgeon: Iran Ouch, MD;  Location: [facility] INVASIVE CV LAB;  Service: Cardiovascular;  Laterality: N/A;  . CORONARY ARTERY BYPASS GRAFT N/A 09/28/2015   Procedure: CORONARY ARTERY BYPASS GRAFTING (CABG) x 3 UTILIZING LEFT MAMMARY ARTERY AND ENDOSCOPICALLY HARVESTED LEFT GREATER SAPHENOUS VEIN.;  Surgeon: Delight Ovens, MD;  Location: [facility] OR;  Service: Open Heart Surgery;  Laterality: N/A;  . ENDOVEIN HARVEST OF GREATER SAPHENOUS VEIN Left 09/28/2015   Procedure: ENDOVEIN HARVEST OF GREATER SAPHENOUS VEIN;  Surgeon: Delight Ovens, MD;  Location: [facility] OR;  Service: Open Heart Surgery;  Laterality: Left;  . ESOPHAGOGASTRODUODENOSCOPY N/A 03/29/2014   Procedure: ESOPHAGOGASTRODUODENOSCOPY (EGD);  Surgeon: Dorena Cookey, MD;  Location: [facility] ENDOSCOPY;  Service: Endoscopy;  Laterality: N/A;  . EYE SURGERY    . FRACTURE SURGERY    . INSERTION OF DIALYSIS CATHETER Right 01/05/2015   Procedure: INSERTION OF RIGHT INTERNAL JUGULAR DIALYSIS CATHETER;  Surgeon: Fransisco Hertz, MD;  Location: [facility] OR;  Service: Vascular;  Laterality: Right;  . LEFT HEART CATH AND CORS/GRAFTS ANGIOGRAPHY N/A 09/13/2016   Procedure: LEFT HEART CATH AND CORS/GRAFTS ANGIOGRAPHY;  Surgeon: Verne Carrow  D, MD;  Location: [facility] INVASIVE CV LAB;  Service: Cardiovascular;  Laterality: N/A;  . RETINAL LASER PROCEDURE Bilateral   . RIGHT/LEFT HEART CATH AND CORONARY/GRAFT ANGIOGRAPHY N/A 03/06/2017   Procedure: RIGHT/LEFT HEART CATH AND CORONARY/GRAFT ANGIOGRAPHY;  Surgeon: Marykay Lex, MD;  Location: [facility] INVASIVE CV LAB;  Service: Cardiovascular;  Laterality: N/A;  . TEE WITHOUT CARDIOVERSION N/A 09/28/2015   Procedure: TRANSESOPHAGEAL ECHOCARDIOGRAM (TEE);  Surgeon: Delight Ovens, MD;  Location: [facility] OR;  Service: Open Heart Surgery;  Laterality: N/A;  . TONSILLECTOMY AND ADENOIDECTOMY  1970s    Family History  Problem Relation Age of Onset  . Obesity Mother        Patient states that family members have no other medical illnesses other than what I have described  . Kidney cancer Maternal Grandmother   . Cancer Father 50       AML  . Heart disease Unknown        No family history    Social History:  reports that he has never smoked. He has never used smokeless tobacco. He reports that he drinks alcohol. He reports that he does not use drugs.    Allergies  Allergen Reactions  . Nsaids Other (See Comments)    Told to avoid all nsaids due to kidney disease   . Tape Other (See Comments)    Welts result, if left for a long amount of time    Medications Prior to Admission  Medication Sig Dispense Refill  . acetaminophen (TYLENOL) 500 MG tablet Take 1 tablet (500 mg total) by mouth every 6 (six) hours as needed for moderate pain. (Patient taking differently: Take 1,000 mg by mouth every 6 (six) hours as needed (for pain). ) 30 tablet 0  . albuterol (PROVENTIL HFA;VENTOLIN HFA) 108 (90 Base) MCG/ACT inhaler Inhale 1 puff into the lungs every 6 (six) hours as needed for wheezing or shortness of breath. (Patient taking differently: Inhale 2 puffs into the lungs every 8 (eight) hours as needed for wheezing or shortness of breath. ) 1 Inhaler 11  . aspirin EC 81 MG EC tablet Take 1 tablet (81 mg total) by mouth daily.    Marland Kitchen atorvastatin (LIPITOR) 80 MG tablet Take 1 tablet (80 mg total) by mouth daily at 6 PM. 90 tablet 3  . cinacalcet (SENSIPAR) 30 MG tablet Take 30 mg by mouth every Monday, Wednesday, and Friday.    . cyclobenzaprine (FLEXERIL) 10 MG tablet Take 1  tablet (10 mg total) by mouth 3 (three) times daily as needed for muscle spasms. 30 tablet 0  . ferric gluconate 62.5 mg in sodium chloride 0.9 % 100 mL Inject 62.5 mg into the vein every Wednesday with hemodialysis.    . fluticasone (FLONASE) 50 MCG/ACT nasal spray Place 1 spray into both nostrils daily. 1 g 0  . gabapentin (NEURONTIN) 100 MG capsule Take 2 capsules (200 mg total) by mouth 3 (three) times daily. 180 capsule 0  . hydrALAZINE (APRESOLINE) 25 MG tablet Take 1 tablet (25 mg total) by mouth 3 (three) times daily. (Patient taking differently: Take 25 mg by mouth as needed. ) 90 tablet 0  . HYDROcodone-acetaminophen (NORCO/VICODIN) 5-325 MG tablet Take 1-2 tablets by mouth every 4 (four) hours as needed for moderate pain. 30 tablet 0  . Immune Globulin, Human, (PRIVIGEN) 40 GM/400ML SOLN Inject 40 g into the vein every 30 (thirty) days. 40gm/dayx 5 days and then 40gm q4 weeks     . insulin aspart (  NOVOLOG FLEXPEN) 100 UNIT/ML FlexPen Inject 8-20 Units into the skin 3 (three) times daily with meals. (Patient taking differently: Inject 8-20 Units into the skin 3 (three) times daily with meals. Sliding scale 5 units per carb.) 15 mL 11  . Insulin Glargine (BASAGLAR KWIKPEN) 100 UNIT/ML SOPN Inject 30 units 2x a day under skin (Patient taking differently: Inject 30 Units into the skin 2 (two) times daily. Inject 30 units 2x a day under skin) 15 mL 3  . levothyroxine (SYNTHROID, LEVOTHROID) 50 MCG tablet Take 1 tablet (50 mcg total) by mouth daily before breakfast. 45 tablet 3  . metoCLOPramide (REGLAN) 5 MG tablet Take 1 tablet (5 mg total) by mouth every 8 (eight) hours as needed for nausea or vomiting. 30 tablet 0  . sucralfate (CARAFATE) 1 g tablet Take 1 tablet (1 g total) by mouth 4 (four) times daily. (Patient taking differently: Take 1 g by mouth as needed. ) 60 tablet 0  . DULoxetine (CYMBALTA) 30 MG capsule Take 1 capsule (30 mg total) by mouth daily. 30 capsule 3  . metoprolol  succinate (TOPROL-XL) 25 MG 24 hr tablet Take 1.5 tablets (37.5 mg total) by mouth 2 (two) times daily. 60 tablet 0  . mycophenolate (CELLCEPT) 250 MG capsule Take 1 capsule (250 mg total) by mouth 2 (two) times daily. 60 capsule 3  . Semaglutide (OZEMPIC) 0.25 or 0.5 MG/DOSE SOPN Inject 0.5 mg into the skin once a week. 2 pen 5    Home: Home Living Family/patient expects to be discharged to:: Private residence Living Arrangements: Parent Available Help at Discharge: Family, Available 24 hours/day Type of Home: House Home Access: Stairs to enter [employer] of Steps: 1 Entrance Stairs-Rails: None Home Layout: One level Bathroom Shower/Tub: Engineer, manufacturing systems: Standard Bathroom Accessibility: Yes Home Equipment: Cane - single point, Bedside commode Additional Comments: log cabin,   Functional History: Prior Function Level of Independence: Independent with assistive device(s) Comments: SPC recently with neuropathy Functional Status:  Mobility: Bed Mobility Overal bed mobility: Modified Independent General bed mobility comments: railiing to sit up Transfers Overall transfer level: Needs assistance Equipment used: Rolling walker (2 wheeled) Transfers: Sit to/from Stand Sit to Stand: Mod assist, Max assist General transfer comment: dense assistance with elevation of bed Ambulation/Gait Ambulation/Gait assistance: Min assist Ambulation Distance (Feet): 50 Feet Assistive device: None(use of handrails in hallway) Gait Pattern/deviations: Step-through pattern, Ataxic, Staggering left, Staggering right, Wide base of support, Decreased dorsiflexion - right, Decreased dorsiflexion - left General Gait Details: unable to step Gait velocity: slowed Gait velocity interpretation: <1.8 ft/sec, indicate of risk for recurrent falls    ADL: ADL Overall ADL's : Needs assistance/impaired Eating/Feeding: Minimal assistance, Sitting Grooming: Minimal assistance,  Sitting Upper Body Bathing: Minimal assistance, Sitting Lower Body Bathing: Moderate assistance, Sit to/from stand, Cueing for sequencing, Cueing for safety Upper Body Dressing : Minimal assistance, Standing Lower Body Dressing: Maximal assistance, Sit to/from stand, Cueing for sequencing, Cueing for safety Toilet Transfer: Moderate assistance, Comfort height toilet, RW Toileting- Clothing Manipulation and Hygiene: Moderate assistance, Sit to/from stand  Cognition: Cognition Overall Cognitive Status: Within Functional Limits for tasks assessed Orientation Level: Oriented X4 Cognition Arousal/Alertness: Awake/alert Behavior During Therapy: WFL for tasks assessed/performed Overall Cognitive Status: Within Functional Limits for tasks assessed   Blood pressure 105/68, pulse 74, temperature 97.7 F (36.5 C), temperature source Oral, resp. rate 16, height  (1.88 m), weight 117.3 kg (258 lb 9.6 oz), SpO2 99 %. Physical Exam  Vitals reviewed. Constitutional:  He is oriented to person, place, and time. He appears well-developed.  obese  HENT:  Head: Normocephalic and atraumatic.  Eyes: EOM are normal. Right eye exhibits no discharge. Left eye exhibits no discharge.  Neck: Normal range of motion. Neck supple.  Cardiovascular: Normal rate and regular rhythm.  Respiratory: Effort normal and breath sounds normal.  + [place]  GI: Soft. Bowel sounds are normal.  Musculoskeletal:  Bilateral lower extremity edema  Neurological: He is alert and oriented to person, place, and time.  Sensation diminished to light touch bilateral hands as well as bilateral lower extremity distal to cast Motor: Right upper extremity: 4+/5 proximal to distal Left upper extremity: 4/5 proximal to distal Bilateral lower extremities: Hip flexion 4-/5, knee extension 4-/5, ankle dorsiflexion 4 -/5  Skin: Skin is warm and dry.  Psychiatric: He has a normal mood and affect. His behavior is normal.    Results for orders  placed or performed during the hospital encounter of 05/20/17 (from the past 24 hour(s))  Glucose, capillary     Status: Abnormal   Collection Time: 05/22/17 11:48 AM  Result Value Ref Range   Glucose-Capillary 132 (H) 65 - 99 mg/dL  Glucose, capillary     Status: Abnormal   Collection Time: 05/22/17  4:39 PM  Result Value Ref Range   Glucose-Capillary 190 (H) 65 - 99 mg/dL  Glucose, capillary     Status: Abnormal   Collection Time: 05/22/17  9:00 PM  Result Value Ref Range   Glucose-Capillary 202 (H) 65 - 99 mg/dL  CBC     Status: Abnormal   Collection Time: 05/23/17  8:28 AM  Result Value Ref Range   WBC 7.2 4.0 - 10.5 K/uL   RBC 2.65 (L) 4.22 - 5.81 MIL/uL   Hemoglobin 8.0 (L) 13.0 - 17.0 g/dL   HCT 40.9 (L) 81.1 - 91.4 %   MCV 94.0 78.0 - 100.0 fL   MCH 30.2 26.0 - 34.0 pg   MCHC 32.1 30.0 - 36.0 g/dL   RDW 78.2 95.6 - 21.3 %   Platelets 281 150 - 400 K/uL  Basic metabolic panel     Status: Abnormal   Collection Time: 05/23/17  8:28 AM  Result Value Ref Range   Sodium 133 (L) 135 - 145 mmol/L   Potassium 5.2 (H) 3.5 - 5.1 mmol/L   Chloride 93 (L) 101 - 111 mmol/L   CO2 26 22 - 32 mmol/L   Glucose, Bld 186 (H) 65 - 99 mg/dL   BUN 54 (H) 6 - 20 mg/dL   Creatinine, Ser 08.65 (H) 0.61 - 1.24 mg/dL   Calcium 8.7 (L) 8.9 - 10.3 mg/dL   GFR calc non Af Amer 5 (L) >60 mL/min   GFR calc Af Amer 6 (L) >60 mL/min   Anion gap 14 5 - 15   No results found.  Assessment/Plan: Diagnosis: Debility Labs and images independently reviewed.  Records reviewed and summated above.  1. Does the need for close, 24 hr/day medical supervision in concert with the patient's rehab needs make it unreasonable for this patient to be served in a less intensive setting? Yes  2. Co-Morbidities requiring supervision/potential complications: ESRD (recs per nephro), CAD (continue meds), T2 DM--poorly controlled with gastroparesis and peripheral neuropathy (Monitor in accordance with exercise and adjust  meds as necessary), CIDP (continue monthly IVIG), gait disorder, asthma (monitor respiratory rate and O2 sats with increased exertion), hypothyroidism (cont meds, ensure appropriate mood and energy level for therapies)from acute on  chronic anemia (transfuse if necessary to ensure appropriate perfusion for increased activity tolerance) supplement oxygen dependent (wean as tolerated)  3. Due to bowel management, safety, skin/wound care, disease management and patient education, does the patient require 24 hr/day rehab nursing? Yes 4. Does the patient require coordinated care of a physician, rehab nurse, PT (1-2 hrs/day, 5 days/week) and OT (1-2 hrs/day, 5 days/week) to address physical and functional deficits in the context of the above medical diagnosis(es)? Yes Addressing deficits in the following areas: balance, endurance, locomotion, strength, transferring, bowel/bladder control, bathing, dressing, feeding, grooming, toileting and psychosocial support 5. Can the patient actively participate in an intensive therapy program of at least 3 hrs of therapy per day at least 5 days per week? Yes 6. The potential for patient to make measurable gains while on inpatient rehab is excellent 7. Anticipated functional outcomes upon discharge from inpatient rehab are supervision and min assist  with PT, supervision and min assist with OT, n/a with SLP. 8. Estimated rehab length of stay to reach the above functional goals is: 15-19 days. 9. Anticipated D/C setting: Home 10. Anticipated post D/C treatments: HH therapy and Home excercise program 11. Overall Rehab/Functional Prognosis: good  RECOMMENDATIONS: This patient's condition is appropriate for continued rehabilitative care in the following setting: CIR if adequate caregiver support available on discharge. Patient has agreed to participate in recommended program. Yes Note that insurance prior authorization may be required for reimbursement for recommended  care.  Comment: Rehab Admissions Coordinator to follow up.   I have personally performed a face to face diagnostic evaluation, including, but not limited to relevant history and physical exam findings, of this patient and developed relevant assessment and plan.  Additionally, I have reviewed and concur with the physician assistant's documentation above.   Maryla Morrow, MD, ABPMR Jacquelynn Cree, PA-C 05/23/2017

## 2017-05-24 ENCOUNTER — Encounter (HOSPITAL_COMMUNITY): Payer: Self-pay | Admitting: *Deleted

## 2017-05-24 DIAGNOSIS — G6181 Chronic inflammatory demyelinating polyneuritis: Secondary | ICD-10-CM

## 2017-05-24 DIAGNOSIS — R531 Weakness: Secondary | ICD-10-CM

## 2017-05-24 DIAGNOSIS — R0602 Shortness of breath: Secondary | ICD-10-CM

## 2017-05-24 DIAGNOSIS — E8770 Fluid overload, unspecified: Secondary | ICD-10-CM

## 2017-05-24 LAB — GLUCOSE, CAPILLARY
GLUCOSE-CAPILLARY: 153 mg/dL — AB (ref 65–99)
GLUCOSE-CAPILLARY: 128 mg/dL — AB (ref 65–99)
GLUCOSE-CAPILLARY: 155 mg/dL — AB (ref 65–99)
Glucose-Capillary: 156 mg/dL — ABNORMAL HIGH (ref 65–99)

## 2017-05-24 MED ORDER — METOCLOPRAMIDE HCL 5 MG PO TABS
5.0000 mg | ORAL_TABLET | Freq: Two times a day (BID) | ORAL | Status: DC
  Administered 2017-05-24 – 2017-05-25 (×4): 5 mg via ORAL
  Filled 2017-05-24 (×4): qty 1

## 2017-05-24 NOTE — Progress Notes (Signed)
Inpatient Diabetes Program Recommendations  AACE/ADA: New Consensus Statement on Inpatient Glycemic Control (2015)  Target Ranges:  Prepandial:   less than 140 mg/dL      Peak postprandial:   less than 180 mg/dL (1-2 hours)      Critically ill patients:  140 - 180 mg/dL  Results for OSTEN, JANEK" (MRN [id number]) as of 05/24/2017 12:07  Ref. Range 05/23/2017 12:25 05/23/2017 16:26 05/23/2017 21:07 05/24/2017 07:29 05/24/2017 11:31  Glucose-Capillary Latest Ref Range: 65 - 99 mg/dL 562 (H) 130 (H) 865 (H) 156 (H) 153 (H)   Results for ELGIN, CARN" (MRN [id number]) as of 05/23/2017 12:00  Ref. Range 05/23/2017 08:28  Glucose Latest Ref Range: 65 - 99 mg/dL 284 (H)   Results for DEVANSH, RIESE" (MRN [id number]) as of 05/23/2017 12:00  Ref. Range 05/22/2017 08:01 05/22/2017 11:48 05/22/2017 16:39 05/22/2017 21:00  Glucose-Capillary Latest Ref Range: 65 - 99 mg/dL 725 (H) 366 (H) 440 (H) 202 (H)   Review of Glycemic Control  Current orders for Inpatient glycemic control: Lantus 15 units BID, Novolog 0-15 units TID with meals  Inpatient Diabetes Program Recommendations: Correction (SSI): Please consider ordering Novolog 0-5 units QHS for bedtime correction. Insulin - Meal Coverage: Please consider orderin Novolog 2 units TID with meals for meal coverage if patient eats at least 50% of meals.  Thanks, Orlando Penner, RN, MSN, CDE Diabetes Coordinator Inpatient Diabetes Program [phone] (Team Pager from 8am to 5pm)

## 2017-05-24 NOTE — Progress Notes (Signed)
Inpatient Rehabilitation-Admissions Coordinator    Met with patient at the bedside to discuss team's recommendation for inpatient rehabilitation. Shared booklets, expectations while in CIR, expected length of stay, and anticipated functional level at DC. Pt preferring CIR at this time. No bed available in CIR today for this patient; will follow up tomorrow if patient remains in house. Call if questions.   Jhonnie Garner, OTR/L  Rehab Admissions Coordinator  [phone] 05/24/2017 12:53 PM

## 2017-05-24 NOTE — PMR Pre-admission (Signed)
PMR Admission Coordinator Pre-Admission Assessment  Patient: Russell [name] is an 47 y.o., male MRN: [id number] DOB: Jul 20, 1970 Height: 6' 2"  (188 cm) Weight: 115.1 kg (253 lb 12 oz)              Insurance Information HMO:     PPO:      PCP:      IPA:      80/20: Yes    OTHER:  PRIMARY: Medicare Part A and B      Policy#: [id number]      Subscriber: Patient Benefits:  Phone #: Checked via [employer] online     Name:  Eff. 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 male with history of ESRD- HD MWF, CAD, T2DM--poorly controlled with gastroparesis and peripheral neuropathy, CIDP- monthly IVIG, gait disorder and asthma; who was admitted on 05/20/17 with N/V, SOB and left sided chest pain. History taken from chart review and patient. Last dialysis session on Friday before and he was found to have pulmonary edema, hyperkalemia and hyperglycemia. He was treated with IV calcium gluconate and HD with improvement in respiratory symptoms. He continues to have issues with abdominal discomfort and nausea as well as concerns about need for  multiple medications. Therapy recommending CIR due to functional deficits. Pt to be admitted to CIR on 05/25/17.      Past Medical History  Past Medical History:  Diagnosis Date  . Anxiety   . Asthma   . CIDP (chronic inflammatory demyelinating polyneuropathy) ([place]) 01/10/2017  . 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 Daily headache   . Depression   . DVT (deep venous thrombosis), H/o 01/2014-on Xarelto 03/24/2014   LLE  . ESRD (end stage renal disease) on dialysis [facility])    "[place], MWF" (06/29/2016)  . Gait abnormality 12/22/2016  . Gastroparesis 12/22/2016  . Hypertension   . Hypothyroidism   . Nephrotic syndrome 05/18/2014  . Neuropathy   . Type 2 diabetes mellitus with diabetic nephropathy (HCC)     Family History  family history includes Cancer (age of onset: 18) in his father; Heart disease in his unknown relative; Kidney cancer in his maternal grandmother; Obesity in his mother.  Prior Rehab/Hospitalizations:  Has the patient had major surgery during 100 days prior  to admission? Yes; he reproted having a cardiac cath procedure in March that required anesthesia.  Current Medications   Current Facility-Administered Medications:  .  albuterol (PROVENTIL) (2.5 MG/3ML) 0.083% nebulizer solution 2.5 mg, 2.5 mg, Nebulization, Q6H PRN, Velna Ochs, MD .  aspirin chewable tablet 81 mg, 81 mg, Oral, Daily, Velna Ochs, MD, 81 mg at 05/24/17 0916 .  atorvastatin (LIPITOR) tablet 80 mg, 80 mg, Oral, q1800, Velna Ochs, MD, 80 mg at 05/23/17 1838 .  calcitRIOL (ROCALTROL) capsule 1.25 mcg, 1.25 mcg, Oral, Q M,W,F-HD, Ernest Haber, PA-C, 1.25 mcg at 05/23/17 1103 .  calcium acetate (PHOSLO) capsule 2,001 mg, 2,001 mg, Oral, TID WC, Zeyfang, David, PA-C, 2,001 mg at 05/24/17 1216 .  cinacalcet (SENSIPAR) tablet 30 mg, 30 mg,  Oral, Q M,W,F, Guilloud, Carolyn, MD .  cyclobenzaprine (FLEXERIL) tablet 10 mg, 10 mg, Oral, TID PRN, Velna Ochs, MD, 10 mg at 05/23/17 2118 .  Darbepoetin Alfa (ARANESP) injection 100 mcg, 100 mcg, Intravenous, Q Wed-HD, Ernest Haber, PA-C, 100 mcg at 05/23/17 1102 .  DULoxetine (CYMBALTA) DR capsule 30 mg, 30 mg, Oral, Daily, Velna Ochs, MD, 30 mg at 05/24/17 0916 .  fluticasone (FLONASE) 50 MCG/ACT nasal spray 1 spray, 1 spray, Each Nare, Daily, Velna Ochs, MD, 1 spray at 05/24/17 0919 .  gabapentin (NEURONTIN) capsule 200 mg, 200 mg, Oral, QHS, Colbert Ewing, MD, 200 mg at 05/23/17 2119 .  heparin injection 5,000 Units, 5,000 Units, Subcutaneous, Q8H, Guilloud, Hoyle Sauer, MD .  HYDROcodone-acetaminophen (NORCO/VICODIN) 5-325 MG per tablet 1-2 tablet, 1-2 tablet, Oral, Q4H PRN, Velna Ochs, MD, 2 tablet at 05/24/17 0916 .  hydrocortisone cream 1 %, , Topical, TID PRN, Colbert Ewing, MD .  insulin aspart (novoLOG) injection 0-15 Units, 0-15 Units, Subcutaneous, TID WC, Velna Ochs, MD, 3 Units at 05/24/17 1216 .  insulin glargine (LANTUS) injection 15 Units, 15 Units, Subcutaneous, BID, Velna Ochs, MD, 15 Units at 05/24/17 [phone] .  levothyroxine (SYNTHROID, LEVOTHROID) tablet 50 mcg, 50 mcg, Oral, QAC breakfast, Velna Ochs, MD, 50 mcg at 05/24/17 0825 .  metoCLOPramide (REGLAN) tablet 5 mg, 5 mg, Oral, q12n4p, Velna Ochs, MD, 5 mg at 05/24/17 1216 .  metoprolol succinate (TOPROL-XL) 24 hr tablet 37.5 mg, 37.5 mg, Oral, BID, Velna Ochs, MD, 37.5 mg at 05/24/17 0916 .  ondansetron (ZOFRAN) injection 4 mg, 4 mg, Intravenous, Q8H PRN, Velna Ochs, MD, 4 mg at 05/20/17 1859 .  pantoprazole (PROTONIX) EC tablet 40 mg, 40 mg, Oral, BID AC, Colbert Ewing, MD, 40 mg at 05/24/17 0825 .  senna-docusate (Senokot-S) tablet 1 tablet, 1 tablet, Oral, QHS PRN, Velna Ochs, MD, 1 tablet at 05/23/17 2119  Patients Current Diet:  Diet  Order           Diet renal/carb modified with fluid restriction Diet-HS Snack? Nothing; Fluid restriction: 1200 mL Fluid; Room service appropriate? Yes; Fluid consistency: Thin  Diet effective now          Precautions / Restrictions Precautions Precautions: Fall Restrictions Weight Bearing Restrictions: No   Has the patient had 2 or more falls or a fall with injury in the past year?Yes; 3 falls within the year with 1 resulting in knee abrasion.   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 None of the above; Used a single point cane intermittently.   Prior Functional Level Prior Function Level of Independence: Independent with assistive device(s) Comments: SPC recently with neuropathy  Self Care: Did the patient need help bathing, dressing, using the toilet or eating?  Independent  Indoor Mobility: Did the patient need assistance with walking from room to room (with or without device)? Independent  Stairs: Did the patient need assistance with internal or external stairs (with or without device)? Needed some help; pt reports he would avoid them  Functional Cognition: Did the patient need help planning regular tasks such as shopping or remembering to take medications? Independent  Current Functional Level Cognition  Overall Cognitive Status: Within Functional Limits for tasks assessed Orientation Level: Oriented X4    Extremity Assessment (includes Sensation/Coordination)  Upper Extremity Assessment: Generalized weakness RUE Sensation: decreased light touch RUE Coordination: decreased fine motor LUE Sensation: decreased light touch LUE  Coordination: decreased fine motor  Lower Extremity Assessment: Generalized weakness(hips 4+ add, 4- add, knee ext 4- R and 3 to 3+ otherwise) RLE Deficits / Details: no sensation below mid calf, ROM of hip and knee grossly WFL, limited ankle dorsiflexion, strength of hip and knee grossly 4/5 with MMT causes pain RLE: Unable to fully assess due to pain RLE Sensation: decreased light touch, history of peripheral neuropathy, decreased proprioception(CIDP) RLE Coordination: decreased gross motor LLE Deficits / Details: no sensation below mid calf, ROM of hip and knee grossly WFL, limited ankle dorsiflexion, strength of hip and knee grossly 3+/5 with MMT causes pain LLE: Unable to fully assess due to pain LLE Sensation: decreased light touch, history of peripheral neuropathy, decreased proprioception(CIDP) LLE Coordination: decreased gross motor    ADLs  Overall ADL's : Needs assistance/impaired Eating/Feeding: Minimal assistance, Sitting Grooming: Minimal assistance, [name], Wash/dry hands, Wash/dry face Upper Body Bathing: Sitting, Min guard Upper Body Bathing Details (indicate cue type and reason): simulated Lower Body Bathing: Moderate assistance, Sit to/from stand, Cueing for sequencing, Cueing for safety Upper Body Dressing : Minimal assistance, [name] Lower Body Dressing: Sit to/from stand, Cueing for sequencing, Cueing for safety, Moderate assistance, Sitting/lateral leans Toilet Transfer: Comfort height toilet, RW, Minimal assistance, Grab bars, Ambulation Toilet Transfer Details (indicate cue type and reason): RW used to bathroom, HHA on way back from bathroom Toileting- Clothing Manipulation and Hygiene: Sit to/from stand, Minimal assistance Functional mobility during ADLs: Moderate assistance, Minimal assistance    Mobility  Overal bed mobility: Modified Independent General bed mobility comments: used rails    Transfers  Overall transfer level: Needs assistance Equipment  used: Rolling walker (2 wheeled) Transfers: Sit to/from Stand Sit to Stand: Mod assist, Min assist General transfer comment: bed elevated, mod A fro bed, min A for toilet using grab bar    Ambulation / Gait / Stairs / Wheelchair Mobility  Ambulation/Gait Ambulation/Gait assistance: Museum/gallery curator (Feet): 50 Feet Assistive device: None(use of handrails in hallway) Gait Pattern/deviations: Step-through pattern, Ataxic, Staggering left, Staggering right, Wide base of support, Decreased dorsiflexion - right, Decreased dorsiflexion - left General Gait Details: unable to step Gait velocity: slowed Gait velocity interpretation: <1.8 ft/sec, indicate of risk for recurrent falls    Posture / Balance Balance Overall balance assessment: Needs assistance Sitting-balance support: No upper extremity supported, Feet supported Sitting balance-Leahy Scale: Good [name] balance support: No upper extremity supported, During functional activity [name] balance-Leahy Scale: Poor [name] balance comment: able to static stand wound require support for dynamic  balance    Special needs/care consideration BiPAP/CPAP:No CPM: No Continuous Drip IV: No Dialysis: Yes, Mon/Wed/Friday       Life Vest: No Oxygen: currently on [place] in room; new to this admission 2/2 SOB Special Bed: No Trach Size: No Wound Vac (area): No      Location:No Skin:Pt reports having an itchy back currently; abrasion to L knee; Fistula to L forearm                    Bowel mgmt: 05/24/17; continent  Bladder mgmt: Does not make urine consistently; Continent when able to produce urine Diabetic mgmt: Yes, use of injections.      Previous Home Environment Living Arrangements: Parent Available Help at Discharge: Family, Available 24 hours/day Type of Home: House Home Layout: One level Home Access: Stairs to enter Entrance Stairs-Rails: None Entrance Stairs-Number of Steps: 1 Bathroom Shower/Tub: Research officer, political party: Yes Home Care Services: No Additional Comments: log cabin,   Discharge Living Setting Plans for Discharge Living Setting: House Type of Home at Discharge: House(log cabin) Discharge Home Layout: One level Discharge Home Access: Stairs to enter Entrance Stairs-Rails: None Entrance Stairs-Number of Steps: 1 Discharge Bathroom Shower/Tub: Tub/shower unit Discharge Bathroom Toilet: Standard(but has access to [facility]) Discharge Bathroom Accessibility: No Does the patient have any problems obtaining your medications?: No  Social/Family/Support Systems Patient Roles: Partner Anticipated Caregiver: (Mother (Patsy), Dad Richardson Landry), girlfriend Bartholomew Boards)) Anticipated Caregiver's Contact Information: (Mom: [phone]; Dad: [phone]) Ability/Limitations of Caregiver: (Parents are able to physically assist per pt report) Caregiver Availability: 24/7 Discharge Plan Discussed with Primary Caregiver: Yes Is Caregiver In Agreement with Plan?: Yes Does Caregiver/Family have Issues with Lodging/Transportation while Pt is in Rehab?: No   Goals/Additional Needs Patient/Family Goal for Rehab: PT: Sup/Min A; OT: Sup/Min A; SLP: NA Expected length of stay: 15-19 days Cultural Considerations: NA Dietary Needs: Renal/Card modified diet with fluid restrction of 1200 mL fluid; thin liquids Equipment Needs: TBD Special Service Needs: HD on MWF Additional Information: Pt would like to know if his GI consult could be completed in house; he requests follow up with Dr. Sherlynn Stalls for prior eye surgery appox 2 weeks ago Pt/Family Agrees to Admission and willing to participate: Yes Program Orientation Provided & Reviewed with Pt/Caregiver Including Roles  & Responsibilities: Yes(pt) Additional Information Needs: Pt would like to have information regarding medication management; services to help manage large amounts of pills Information Needs to be Provided  By: Nursing vs SW  Barriers to Discharge: Home environment access/layout, New oxygen   Decrease burden of Care through IP rehab admission: NA   Possible need for SNF placement upon discharge:Not anticipated   Patient Condition: This patient's medical and functional status has changed since the consult dated: 05/23/17 in which the Rehabilitation Physician determined and documented that the patient's condition is appropriate for intensive rehabilitative care in an inpatient rehabilitation facility. See "History of Present Illness" (above) for medical update. Functional changes are: Mod A sit to stand and Min A 50 feet ambulation. Patient's medical and functional status update has been discussed with the Rehabilitation physician and patient remains appropriate for inpatient rehabilitation. Will admit to inpatient rehab today.  Preadmission Screen Completed By:  Jhonnie Garner, 05/24/2017 1:42 PM ______________________________________________________________________   Discussed status with Dr. Posey Pronto on 5/24/19at 14:43 and received telephone approval for admission today.  Admission Coordinator:  Jhonnie Garner, time 14:43/Date 05/25/17

## 2017-05-24 NOTE — Progress Notes (Addendum)
Subjective:   Awaiting Rehab/  HD tomor / Noted states has pulled 7 kg at op unit in past , mild sob if walks in room but O2 sat 100%  2 L [place] sitting  Up right in bed   Objective Vital signs in last 24 hours: Vitals:   05/23/17 1727 05/23/17 2115 05/24/17 0520 05/24/17 0831  BP: (!) 111/56 107/68 103/76 128/84  Pulse: 78 71 69 79  Resp: Temp: 97.9 F (36.6 C) 98 F (36.7 C) 98.4 F (36.9 C) 97.9 F (36.6 C)  TempSrc: Oral Oral Oral Oral  SpO2: 100% 100% 100% 99%  Weight:      Height:       Weight change: -1.4 kg (-3 lb 1.4 oz)  Physical Exam: General:Alert Obese wm nad Heart:RRR, No m,r,g Lungs:CTA bilat Abdomen:Obese, soft, NT, ND Extremities:No pedal edema  Dialysis Access:pos bruit LUA AVF   OPDialysis Orders:MWFAshe 4.25 HR EDW 112.5left AVF450/800 EDW 116,K 2 Ca 2.25, ufprofile 4 .heparin none.parsabiv 5,venofer 50/week ,Mircera 75 mcg (last given 05/02/17 )calcitriol 1.75mcg po q hd   Problem/Plan: 1. Hyperkalemia/ Resolved with HD and correction of Hyperglycemia 2. ESRD -HD on Schedule MWF Ok for dc per renal; standpoint 3. Anemia -hgb 9.6>8.0  Restart ESA ,Aranesp 100 given wed 5/22 / fu trend  . Weekly fe on hd  4. Secondary hyperparathyroidism -Phos 8.7/corexc ca 9.3 yest CW his op noncompliance with diet and phos binders = phoslo ac continue  5. HTN/volume/ HO CM 25-35% - HD tomor on schedule will attempt 4 liter  uf profile 4 / Lower edw// ? Body wt loss with Gastroparesis UF  below edw now , lower edw at DC  6. Anxiety / Depression = On meds , feels less anxious with current tx plan of rehab  7. Gastroparesis - resovled Rx per admti team  8. DM type 2 - per admit  9. LE weakness - per admit team wu  =deconditioning vs progression of CIDP- On IVIG  As OP (one week ago infusion )and ICR  Admit ?  Lenny Pastel, PA-C [facility] Beeper [phone] 05/24/2017,9:46 AM  LOS: 4 days   Labs: Basic  Metabolic Panel: Recent Labs  Lab 05/21/17 0522 05/21/17 0817 05/23/17 0828  NA 137 134* 133*  K 5.0 4.5 5.2*  CL 88* 87* 93*  CO2 GLUCOSE 97 129* 186*  BUN 59* 57* 54*  CREATININE 10.93* 10.94* 10.34*  CALCIUM 9.0 8.7* 8.7*  PHOS  --  8.7*  --    Liver Function Tests: Recent Labs  Lab 05/20/17 1316 05/21/17 0817  AST 20  --   ALT 18  --   ALKPHOS 77  --   BILITOT 1.1  --   PROT 7.3  --   ALBUMIN 4.0 3.3*   Recent Labs  Lab 05/20/17 1316  LIPASE 32   No results for input(s): AMMONIA in the last 168 hours. CBC: Recent Labs  Lab 05/20/17 1316 05/21/17 0817 05/22/17 0555 05/23/17 0828  WBC 9.5 12.6* 8.1 7.2  NEUTROABS  --   --  5.2  --   HGB 10.8* 9.5* 9.6* 8.0*  HCT 32.4* 28.8* 30.3* 24.9*  MCV 90.8 91.1 95.9 94.0  PLT 250 258 241 281   Cardiac Enzymes: Recent Labs  Lab 05/20/17 1451 05/20/17 2205 05/21/17 0522  TROPONINI 0.05* 0.06* 0.07*   CBG: Recent Labs  Lab 05/22/17 2100 05/23/17 1225 05/23/17 1626 05/23/17 2107 05/24/17 4540  GLUCAP 202* 180* 187* 105* 156*    Studies/Results: No results found. Medications:  . aspirin  81 mg Oral Daily  . atorvastatin  80 mg Oral q1800  . calcitRIOL  1.25 mcg Oral Q M,W,F-HD  . calcium acetate  2,001 mg Oral TID WC  . cinacalcet  30 mg Oral Q M,W,F  . darbepoetin (ARANESP) injection - DIALYSIS  100 mcg Intravenous Q Wed-HD  . DULoxetine  30 mg Oral Daily  . fluticasone  1 spray Each Nare Daily  . gabapentin  200 mg Oral QHS  . heparin  5,000 Units Subcutaneous Q8H  . insulin aspart  0-15 Units Subcutaneous TID WC  . insulin glargine  15 Units Subcutaneous BID  . levothyroxine  50 mcg Oral QAC breakfast  . metoCLOPramide  5 mg Oral q12n4p  . metoprolol succinate  37.5 mg Oral BID  . pantoprazole  40 mg Oral BID AC

## 2017-05-24 NOTE — Progress Notes (Signed)
Physical Therapy Treatment Patient Details Name: Russell [name] MRN: [id number] DOB: Jul 26, 1970 Today's Date: 05/24/2017    History of Present Illness 47 y.o. 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  Who presented with N, V, hyperglycemia, elevated K. Dx with Acute hypoxic respiratory failure, esophageal dysphagia, diabetic gastroparesis    PT Comments    Patient is progressing very well towards their physical therapy goals. Session focused on gait training and functional strengthening. Patient ambulated with both SPC and without (using railings in hallway). Patient prefers to ambulate without assistive device. Ambulating up to 50 feet at a time with min assist + 2 for steadying/safety. Continues with decreased endurance and poor dynamic balance. Report of shortness of breath with ambulation and desaturation to 92% on RA; placed on 1L O2 for comfort. Removed after session with SpO2 at 97%. Patient remains extremely motivated to participate and engaged with therapy. Remains excellent candidate for CIR to maximize functional independence.    Follow Up Recommendations  CIR     Equipment Recommendations  None recommended by PT    Recommendations for Other Services       Precautions / Restrictions Precautions Precautions: Fall Restrictions Weight Bearing Restrictions: No    Mobility  Bed Mobility               General bed mobility comments: sitting EOB  Transfers Overall transfer level: Needs assistance Equipment used: None Transfers: Sit to/from Stand Sit to Stand: Min guard;Min assist         General transfer comment: Up to min assist from regular bed height but able to progress to min guard assist with bed elevated. x 10 sit to stands utilizing wide BOS  Ambulation/Gait Ambulation/Gait assistance: Min assist;+2 safety/equipment Ambulation Distance  (Feet): 50 Feet Assistive device: None;Straight cane(use of handrails in hallway) Gait Pattern/deviations: Step-through pattern;Wide base of support;Ataxic     General Gait Details: Limited ambulation with SPC; patient preferring use of handrails/wall in hallway for balance. Min assist provided by PT for steadying. Patient requiring 4 standing rest breaks; self regulates well with activity pacing. Patient with shortness of breath and desaturation to 92% so placed on 1L O2 for comfort.    Stairs             Wheelchair Mobility    Modified Rankin (Stroke Patients Only)       Balance Overall balance assessment: Needs assistance Sitting-balance support: No upper extremity supported;Feet supported Sitting balance-Leahy Scale: Good     Standing balance support: No upper extremity supported;During functional activity Standing balance-Leahy Scale: Fair Standing balance comment: Able to statically stand with supervision but poor dynamic balance                            Cognition Arousal/Alertness: Awake/alert Behavior During Therapy: WFL for tasks assessed/performed Overall Cognitive Status: Within Functional Limits for tasks assessed                                        Exercises Other Exercises Other Exercises: 10 sit to stands from elevated bed height to promote functional strengthening/leg power    General Comments        Pertinent Vitals/Pain Pain Assessment: Faces Faces Pain Scale: Hurts even more Pain Location: feet Pain Descriptors /  Indicators: Burning;Tingling Pain Intervention(s): Limited activity within patient's tolerance;Monitored during session    Home Living                      Prior Function            PT Goals (current goals can now be found in the care plan section) Acute Rehab PT Goals Patient Stated Goal: to feel stronger, no dizzy anymore PT Goal Formulation: With patient Time For Goal Achievement:  06/05/17 Potential to Achieve Goals: Good Progress towards PT goals: Progressing toward goals    Frequency    Min 3X/week      PT Plan Current plan remains appropriate    Co-evaluation              AM-PAC PT "6 Clicks" Daily Activity  Outcome Measure  Difficulty turning over in bed (including adjusting bedclothes, sheets and blankets)?: A Little Difficulty moving from lying on back to sitting on the side of the bed? : A Little Difficulty sitting down on and standing up from a chair with arms (e.g., wheelchair, bedside commode, etc,.)?: Unable Help needed moving to and from a bed to chair (including a wheelchair)?: A Little Help needed walking in hospital room?: A Little Help needed climbing 3-5 steps with a railing? : Total 6 Click Score: 14    End of Session Equipment Utilized During Treatment: Gait belt;Oxygen Activity Tolerance: Patient tolerated treatment well Patient left: in bed;with call bell/phone within reach   PT Visit Diagnosis: Unsteadiness on feet (R26.81);Other abnormalities of gait and mobility (R26.89);History of falling (Z91.81);Muscle weakness (generalized) (M62.81);Difficulty in walking, not elsewhere classified (R26.2);Ataxic gait (R26.0);Other symptoms and signs involving the nervous system (R29.898);Pain     Time: 1415-1430 PT Time Calculation (min) (ACUTE ONLY): 15 min  Charges:  $Gait Training: 8-22 mins                    G Codes:       Russell [name], PT, DPT Acute Rehabilitation Services  Pager: [phone]    Vanetta Mulders 05/24/2017, 5:42 PM

## 2017-05-24 NOTE — Progress Notes (Signed)
Occupational Therapy Treatment Patient Details Name: Russell [name] MRN: [id number] DOB: [birth year] Today's Date: 05/24/2017    History of present illness 47 y.o. 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  Who presented with N, V, hyperglycemia, elevated K. Dx with Acute hypoxic respiratory failure, esophageal dysphagia, diabetic gastroparesis   OT comments  Pt making progress with functional goals, OT will continue to follow acutely  Follow Up Recommendations  CIR    Equipment Recommendations  Other (comment)(TBD at next venue of care)    Recommendations for Other Services      Precautions / Restrictions Precautions Precautions: Fall Restrictions Weight Bearing Restrictions: No       Mobility Bed Mobility Overal bed mobility: Modified Independent             General bed mobility comments: used rails  Transfers Overall transfer level: Needs assistance Equipment used: Rolling walker (2 wheeled) Transfers: Sit to/from Stand Sit to Stand: Mod assist;Min assist         General transfer comment: bed elevated, mod A fro bed, min A for toilet using grab bar    Balance Overall balance assessment: Needs assistance Sitting-balance support: No upper extremity supported;Feet supported Sitting balance-Leahy Scale: Good     Standing balance support: No upper extremity supported;During functional activity Standing balance-Leahy Scale: Poor                             ADL either performed or assessed with clinical judgement   ADL Overall ADL's : Needs assistance/impaired     Grooming: Minimal assistance;Standing;Wash/dry hands;Wash/dry face   Upper Body Bathing: Sitting;Min guard Upper Body Bathing Details (indicate cue type and reason): simulated         Lower Body Dressing: Sit to/from stand;Cueing for sequencing;Cueing for  safety;Moderate assistance;Sitting/lateral leans   Toilet Transfer: Comfort height toilet;RW;Minimal assistance;Grab bars;Ambulation Toilet Transfer Details (indicate cue type and reason): RW used to bathroom, HHA on way back from bathroom Toileting- Clothing Manipulation and Hygiene: Sit to/from stand;Minimal assistance       Functional mobility during ADLs: Moderate assistance;Minimal assistance       Vision Patient Visual Report: No change from baseline     Perception     Praxis      Cognition Arousal/Alertness: Awake/alert Behavior During Therapy: WFL for tasks assessed/performed Overall Cognitive Status: Within Functional Limits for tasks assessed                                          Exercises     Shoulder Instructions       General Comments      Pertinent Vitals/ Pain       Pain Assessment: 0-10 Pain Score: 8  Pain Location: hands, feet, stomach,  Pain Descriptors / Indicators: Burning;Tingling Pain Intervention(s): Monitored during session;Repositioned  Home Living                                          Prior Functioning/Environment              Frequency  Min 2X/week        Progress Toward Goals  OT Goals(current goals can  now be found in the care plan section)  Progress towards OT goals: Progressing toward goals  Acute Rehab OT Goals Patient Stated Goal: to feel stronger, no dizzy anymore  Plan Discharge plan remains appropriate    Co-evaluation                 AM-PAC PT "6 Clicks" Daily Activity     Outcome Measure   Help from another person eating meals?: A Little Help from another person taking care of personal grooming?: A Little Help from another person toileting, which includes using toliet, bedpan, or urinal?: A Lot Help from another person bathing (including washing, rinsing, drying)?: A Little Help from another person to put on and taking off regular upper body clothing?: A  Little Help from another person to put on and taking off regular lower body clothing?: A Lot 6 Click Score: 16    End of Session Equipment Utilized During Treatment: Rolling walker;Gait belt  OT Visit Diagnosis: Unsteadiness on feet (R26.81);Muscle weakness (generalized) (M62.81);History of falling (Z91.81);Repeated falls (R29.6)   Activity Tolerance Patient limited by fatigue   Patient Left in bed;with call bell/phone within reach;Other (comment)(CIR case mgr)   Nurse Communication      Functional Assessment Tool Used: AM-PAC 6 Clicks Daily Activity   Time: 4098-1191 OT Time Calculation (min): 25 min  Charges: OT G-codes **NOT FOR INPATIENT CLASS** Functional Assessment Tool Used: AM-PAC 6 Clicks Daily Activity OT General Charges $OT Visit: 1 Visit OT Treatments $Self Care/Home Management : 8-22 mins $Therapeutic Activity: 8-22 mins     Galen Manila 05/24/2017, 11:46 AM

## 2017-05-24 NOTE — Progress Notes (Addendum)
Subjective:  Mr. Nordquist reported improvement in his breathing compared to admission. He feels like he is wheezing today. He continues to endorse abdominal soreness and nausea, however is able to tolerate PO intake and denies emesis. Has had a BM.  Objective:  Vital signs in last 24 hours: Vitals:   05/23/17 1228 05/23/17 1727 05/23/17 2115 05/24/17 0520  BP: 113/73 (!) 111/56 107/68 103/76  Pulse: 73 78 71 69  Resp: Temp: 97.8 F (36.6 C) 97.9 F (36.6 C) 98 F (36.7 C) 98.4 F (36.9 C)  TempSrc: Oral Oral Oral Oral  SpO2: 100% 100% 100% 100%  Weight:      Height:       GEN: Lying in bed in NAD in HD CV: NR & RR, no m/r/g PULM: CTAB, no wheezes or rales ABD: normal BS, mild TTP, soft, obese MSK: Trace BLE edema  Assessment/Plan:  Active Problems:   ESRD (end stage renal disease) (HCC)   Nausea and vomiting   Pulmonary edema   Diabetes mellitus type 2 in obese (HCC)   Diabetic peripheral neuropathy (HCC)   Diabetic gastroparesis (HCC)   Hypothyroidism   Anemia of chronic disease   Acute blood loss anemia   Abnormal EKG   Dysphagia   Supplemental oxygen dependent  Mr. Vidrio is a 47yo male with PMH of DM, gastroparesis, ESRD on HD MWF, CIDP, HFrEF (EF 25-30%), CAD s/p CABG, HTN, and hypothyroidism who presents with 1 day of abdominal soreness, N/V/D, and subsequent development of CP and SOB. We are currently treating for a flare of his gastroparesis. Respiratory status improved with dialysis. Plan for discharge to CIR and outpatient follow up with Neurology for LE weakness. Medically stable for discharge pending approval for CIR.  LE weakness, Shortness of breath Most likely a combination of several etiologies, including deconditioning, diabetic peripheral neuropathy, and anemia. Less likely progression of CIDP. Currently saturating well on 2L [place], which is placed for comfort. O2 sats were checked yesterday on RA, and no hypoxia noted. PT/OT recommends  discharge to CIR, with which patient is amenable. - CIR consulted - F/u with Neurology outpatient for continued management of CIDP - F/u with PCP for continued management of diabetes  Diabetic Gastroparesis flare Symptoms improving. Tolerating PO intake. - Small frequent meals - Decreased home metoclopramide to renal dosing  BID - Holding home semaglutide - Norco q4h PRN for moderate pain  Esophageal dysphagia Recent imaging shows no evidence of an esophageal mass or stricture. - Outpatient f/u with GI - Continue PO pantoprazole  BID  CIDP Receiving IVIG every 3 weeks (last treatment about a week ago) and planning to start mycophenolate, has not yet started. - Norco q4h PRN for moderate pain - Cymbalta qAM and gabapentin  QHS for pain - Outpatient f/u with Neurology  Volume overload, improved after dialysis HFrEF (TTE 03/2017: EF 25-30%) ESRD on HD MWF Saturating well on RA. HD yesterday with improvement in SOB. - Nephrology consulted; appreciate their assistance - Dialysis per Nephro - Continue home metoprolol 37.5mg  BID - Wean supplemental oxygen as tolerated  CAD s/p CABG in 2017 Recent cath 03/2017 performed for newly reduced EF showed patent grafts and likely nonischemic/microvascular etiology. - Telemetry - Continue home aspirin  daily and atorvastatin  QHS  DM Home regimen includes Novolog 8-20 units TID WC, glargine 30 units BID, and semaglutide 0.5mg  qweek. BG 105-156 - Lantus 15u BID - Novolog SSI-M q4h - CBG monitoring  Dispo: Anticipated discharge  in approximately 0-1 day(s).   Scherrie Gerlach, MD 05/24/2017, 7:01 AM Pager: Demetrius Charity [phone]

## 2017-05-25 ENCOUNTER — Inpatient Hospital Stay (HOSPITAL_COMMUNITY)
Admission: RE | Admit: 2017-05-25 | Discharge: 2017-06-01 | DRG: 945 | Disposition: A | Discharge status: Home Health | Payer: Medicare Other | Source: Intra-hospital | Attending: Physical Medicine & Rehabilitation | Admitting: Physical Medicine & Rehabilitation

## 2017-05-25 ENCOUNTER — Encounter (HOSPITAL_COMMUNITY): Payer: Self-pay

## 2017-05-25 ENCOUNTER — Other Ambulatory Visit: Payer: Self-pay

## 2017-05-25 DIAGNOSIS — R5381 Other malaise: Secondary | ICD-10-CM | POA: Diagnosis not present

## 2017-05-25 DIAGNOSIS — Z951 Presence of aortocoronary bypass graft: Secondary | ICD-10-CM | POA: Diagnosis not present

## 2017-05-25 DIAGNOSIS — E1122 Type 2 diabetes mellitus with diabetic chronic kidney disease: Secondary | ICD-10-CM | POA: Diagnosis present

## 2017-05-25 DIAGNOSIS — R269 Unspecified abnormalities of gait and mobility: Secondary | ICD-10-CM | POA: Diagnosis present

## 2017-05-25 DIAGNOSIS — G6181 Chronic inflammatory demyelinating polyneuritis: Secondary | ICD-10-CM | POA: Diagnosis present

## 2017-05-25 DIAGNOSIS — K219 Gastro-esophageal reflux disease without esophagitis: Secondary | ICD-10-CM | POA: Diagnosis present

## 2017-05-25 DIAGNOSIS — Z7951 Long term (current) use of inhaled steroids: Secondary | ICD-10-CM | POA: Diagnosis not present

## 2017-05-25 DIAGNOSIS — E1143 Type 2 diabetes mellitus with diabetic autonomic (poly)neuropathy: Secondary | ICD-10-CM | POA: Diagnosis present

## 2017-05-25 DIAGNOSIS — I2510 Atherosclerotic heart disease of native coronary artery without angina pectoris: Secondary | ICD-10-CM | POA: Diagnosis present

## 2017-05-25 DIAGNOSIS — Z91048 Other nonmedicinal substance allergy status: Secondary | ICD-10-CM

## 2017-05-25 DIAGNOSIS — Z8051 Family history of malignant neoplasm of kidney: Secondary | ICD-10-CM

## 2017-05-25 DIAGNOSIS — I1311 Hypertensive heart and chronic kidney disease without heart failure, with stage 5 chronic kidney disease, or end stage renal disease: Secondary | ICD-10-CM | POA: Diagnosis present

## 2017-05-25 DIAGNOSIS — D638 Anemia in other chronic diseases classified elsewhere: Secondary | ICD-10-CM | POA: Diagnosis not present

## 2017-05-25 DIAGNOSIS — N186 End stage renal disease: Secondary | ICD-10-CM

## 2017-05-25 DIAGNOSIS — Z992 Dependence on renal dialysis: Secondary | ICD-10-CM | POA: Diagnosis not present

## 2017-05-25 DIAGNOSIS — M792 Neuralgia and neuritis, unspecified: Secondary | ICD-10-CM | POA: Diagnosis not present

## 2017-05-25 DIAGNOSIS — Z79899 Other long term (current) drug therapy: Secondary | ICD-10-CM

## 2017-05-25 DIAGNOSIS — H04123 Dry eye syndrome of bilateral lacrimal glands: Secondary | ICD-10-CM

## 2017-05-25 DIAGNOSIS — R0602 Shortness of breath: Secondary | ICD-10-CM | POA: Diagnosis not present

## 2017-05-25 DIAGNOSIS — F419 Anxiety disorder, unspecified: Secondary | ICD-10-CM | POA: Diagnosis present

## 2017-05-25 DIAGNOSIS — I10 Essential (primary) hypertension: Secondary | ICD-10-CM | POA: Diagnosis not present

## 2017-05-25 DIAGNOSIS — Z794 Long term (current) use of insulin: Secondary | ICD-10-CM

## 2017-05-25 DIAGNOSIS — F329 Major depressive disorder, single episode, unspecified: Secondary | ICD-10-CM | POA: Diagnosis present

## 2017-05-25 DIAGNOSIS — E1142 Type 2 diabetes mellitus with diabetic polyneuropathy: Secondary | ICD-10-CM | POA: Diagnosis present

## 2017-05-25 DIAGNOSIS — R0989 Other specified symptoms and signs involving the circulatory and respiratory systems: Secondary | ICD-10-CM

## 2017-05-25 DIAGNOSIS — E875 Hyperkalemia: Secondary | ICD-10-CM | POA: Diagnosis present

## 2017-05-25 DIAGNOSIS — Z806 Family history of leukemia: Secondary | ICD-10-CM

## 2017-05-25 DIAGNOSIS — Z7982 Long term (current) use of aspirin: Secondary | ICD-10-CM

## 2017-05-25 DIAGNOSIS — Z886 Allergy status to analgesic agent status: Secondary | ICD-10-CM | POA: Diagnosis not present

## 2017-05-25 DIAGNOSIS — D631 Anemia in chronic kidney disease: Secondary | ICD-10-CM | POA: Diagnosis present

## 2017-05-25 DIAGNOSIS — R7309 Other abnormal glucose: Secondary | ICD-10-CM | POA: Diagnosis not present

## 2017-05-25 DIAGNOSIS — E039 Hypothyroidism, unspecified: Secondary | ICD-10-CM | POA: Diagnosis present

## 2017-05-25 DIAGNOSIS — K3184 Gastroparesis: Secondary | ICD-10-CM | POA: Diagnosis present

## 2017-05-25 DIAGNOSIS — M791 Myalgia, unspecified site: Secondary | ICD-10-CM

## 2017-05-25 DIAGNOSIS — E669 Obesity, unspecified: Secondary | ICD-10-CM | POA: Diagnosis not present

## 2017-05-25 DIAGNOSIS — I120 Hypertensive chronic kidney disease with stage 5 chronic kidney disease or end stage renal disease: Secondary | ICD-10-CM | POA: Diagnosis not present

## 2017-05-25 DIAGNOSIS — I959 Hypotension, unspecified: Secondary | ICD-10-CM | POA: Diagnosis present

## 2017-05-25 DIAGNOSIS — J45909 Unspecified asthma, uncomplicated: Secondary | ICD-10-CM | POA: Diagnosis present

## 2017-05-25 DIAGNOSIS — E1169 Type 2 diabetes mellitus with other specified complication: Secondary | ICD-10-CM | POA: Diagnosis not present

## 2017-05-25 DIAGNOSIS — N2581 Secondary hyperparathyroidism of renal origin: Secondary | ICD-10-CM | POA: Diagnosis present

## 2017-05-25 DIAGNOSIS — Z79890 Hormone replacement therapy (postmenopausal): Secondary | ICD-10-CM | POA: Diagnosis not present

## 2017-05-25 LAB — RENAL FUNCTION PANEL
ANION GAP: 13 (ref 5–15)
Albumin: 3.6 g/dL (ref 3.5–5.0)
BUN: 50 mg/dL — ABNORMAL HIGH (ref 6–20)
CALCIUM: 9.2 mg/dL (ref 8.9–10.3)
CO2: 26 mmol/L (ref 22–32)
Chloride: 90 mmol/L — ABNORMAL LOW (ref 101–111)
Creatinine, Ser: 9.18 mg/dL — ABNORMAL HIGH (ref 0.61–1.24)
GFR, EST AFRICAN AMERICAN: 7 mL/min — AB (ref >60)
GFR, EST NON AFRICAN AMERICAN: 6 mL/min — AB (ref >60)
Glucose, Bld: 92 mg/dL (ref 65–99)
PHOSPHORUS: 6.8 mg/dL — AB (ref 2.5–4.6)
Potassium: 5 mmol/L (ref 3.5–5.1)
SODIUM: 129 mmol/L — AB (ref 135–145)

## 2017-05-25 LAB — CBC
HCT: 29.1 % — ABNORMAL LOW (ref 39.0–52.0)
HEMOGLOBIN: 9.4 g/dL — AB (ref 13.0–17.0)
MCH: 30.5 pg (ref 26.0–34.0)
MCHC: 32.3 g/dL (ref 30.0–36.0)
MCV: 94.5 fL (ref 78.0–100.0)
Platelets: 258 10*3/uL (ref 150–400)
RBC: 3.08 MIL/uL — AB (ref 4.22–5.81)
RDW: 15.2 % (ref 11.5–15.5)
WBC: 8.1 10*3/uL (ref 4.0–10.5)

## 2017-05-25 LAB — GLUCOSE, CAPILLARY
GLUCOSE-CAPILLARY: 111 mg/dL — AB (ref 65–99)
Glucose-Capillary: 85 mg/dL (ref 65–99)

## 2017-05-25 MED ORDER — HYDROCORTISONE 1 % EX CREA
TOPICAL_CREAM | Freq: Three times a day (TID) | CUTANEOUS | Status: DC | PRN
  Administered 2017-05-27: 16:00:00 via TOPICAL
  Filled 2017-05-25: qty 28

## 2017-05-25 MED ORDER — HEPARIN SODIUM (PORCINE) 5000 UNIT/ML IJ SOLN
5000.0000 [IU] | Freq: Three times a day (TID) | INTRAMUSCULAR | Status: DC
  Filled 2017-05-25 (×7): qty 1

## 2017-05-25 MED ORDER — POLYETHYLENE GLYCOL 3350 17 G PO PACK: 17.0000 g | PACK | Freq: Every day | ORAL | Status: DC | PRN

## 2017-05-25 MED ORDER — GABAPENTIN 100 MG PO CAPS
200.0000 mg | ORAL_CAPSULE | Freq: Every day | ORAL | Status: DC
  Administered 2017-05-25 – 2017-05-26 (×2): 200 mg via ORAL
  Filled 2017-05-25 (×2): qty 2

## 2017-05-25 MED ORDER — CINACALCET HCL 30 MG PO TABS
30.0000 mg | ORAL_TABLET | ORAL | Status: DC
  Administered 2017-06-01: 30 mg via ORAL
  Filled 2017-05-25 (×5): qty 1

## 2017-05-25 MED ORDER — CALCITRIOL 0.5 MCG PO CAPS
ORAL_CAPSULE | ORAL | Status: AC
  Filled 2017-05-25: qty 2

## 2017-05-25 MED ORDER — INSULIN ASPART 100 UNIT/ML ~~LOC~~ SOLN: 0.0000 [IU] | Freq: Every day | SUBCUTANEOUS | Status: DC

## 2017-05-25 MED ORDER — SENNOSIDES-DOCUSATE SODIUM 8.6-50 MG PO TABS
1.0000 | ORAL_TABLET | Freq: Every evening | ORAL | Status: DC | PRN
  Administered 2017-05-29: 1 via ORAL
  Filled 2017-05-25: qty 1

## 2017-05-25 MED ORDER — GABAPENTIN 100 MG PO CAPS: 200.0000 mg | ORAL_CAPSULE | Freq: Every day | ORAL | Status: DC

## 2017-05-25 MED ORDER — GUAIFENESIN-DM 100-10 MG/5ML PO SYRP
5.0000 mL | ORAL_SOLUTION | Freq: Four times a day (QID) | ORAL | Status: DC | PRN
Start: 2017-05-25 — End: 2017-06-01

## 2017-05-25 MED ORDER — DIPHENHYDRAMINE HCL 12.5 MG/5ML PO ELIX
12.5000 mg | ORAL_SOLUTION | Freq: Four times a day (QID) | ORAL | Status: DC | PRN
Start: 2017-05-25 — End: 2017-06-01

## 2017-05-25 MED ORDER — PROCHLORPERAZINE EDISYLATE 10 MG/2ML IJ SOLN: 5.0000 mg | Freq: Four times a day (QID) | INTRAMUSCULAR | Status: DC | PRN

## 2017-05-25 MED ORDER — CALCITRIOL 0.25 MCG PO CAPS
ORAL_CAPSULE | ORAL | Status: AC
  Filled 2017-05-25: qty 1

## 2017-05-25 MED ORDER — INSULIN GLARGINE 100 UNIT/ML ~~LOC~~ SOLN
15.0000 [IU] | Freq: Two times a day (BID) | SUBCUTANEOUS | Status: DC
  Administered 2017-05-25 – 2017-05-31 (×12): 15 [IU] via SUBCUTANEOUS
  Filled 2017-05-25 (×15): qty 0.15

## 2017-05-25 MED ORDER — ALUMINUM HYDROXIDE GEL 320 MG/5ML PO SUSP
10.0000 mL | Freq: Four times a day (QID) | ORAL | Status: DC | PRN
  Filled 2017-05-25: qty 30

## 2017-05-25 MED ORDER — PANTOPRAZOLE SODIUM 40 MG PO TBEC: 40.0000 mg | DELAYED_RELEASE_TABLET | Freq: Two times a day (BID) | ORAL | 0 refills | Status: DC

## 2017-05-25 MED ORDER — METOPROLOL SUCCINATE ER 25 MG PO TB24
37.5000 mg | ORAL_TABLET | Freq: Two times a day (BID) | ORAL | Status: DC
  Administered 2017-05-25 – 2017-05-31 (×12): 37.5 mg via ORAL
  Filled 2017-05-25 (×12): qty 2

## 2017-05-25 MED ORDER — ASPIRIN 81 MG PO CHEW
81.0000 mg | CHEWABLE_TABLET | Freq: Every day | ORAL | Status: DC
  Administered 2017-05-26 – 2017-06-01 (×7): 81 mg via ORAL
  Filled 2017-05-25 (×7): qty 1

## 2017-05-25 MED ORDER — ATORVASTATIN CALCIUM 80 MG PO TABS
80.0000 mg | ORAL_TABLET | Freq: Every day | ORAL | Status: DC
  Administered 2017-05-25 – 2017-05-31 (×7): 80 mg via ORAL
  Filled 2017-05-25 (×7): qty 1

## 2017-05-25 MED ORDER — HYDROCODONE-ACETAMINOPHEN 5-325 MG PO TABS: 1.0000 | ORAL_TABLET | Freq: Four times a day (QID) | ORAL | Status: DC | PRN

## 2017-05-25 MED ORDER — METOCLOPRAMIDE HCL 5 MG PO TABS: 5.0000 mg | ORAL_TABLET | Freq: Two times a day (BID) | ORAL | Status: DC

## 2017-05-25 MED ORDER — HYDROCODONE-ACETAMINOPHEN 5-325 MG PO TABS
1.0000 | ORAL_TABLET | Freq: Four times a day (QID) | ORAL | Status: DC | PRN
  Administered 2017-05-25 – 2017-06-01 (×19): 1 via ORAL
  Filled 2017-05-25 (×19): qty 1

## 2017-05-25 MED ORDER — TRAZODONE HCL 50 MG PO TABS
25.0000 mg | ORAL_TABLET | Freq: Every evening | ORAL | Status: DC | PRN
Start: 2017-05-25 — End: 2017-06-01
  Administered 2017-05-29 – 2017-05-31 (×3): 50 mg via ORAL
  Filled 2017-05-25 (×3): qty 1

## 2017-05-25 MED ORDER — PROCHLORPERAZINE MALEATE 5 MG PO TABS: 5.0000 mg | ORAL_TABLET | Freq: Four times a day (QID) | ORAL | Status: DC | PRN

## 2017-05-25 MED ORDER — LEVOTHYROXINE SODIUM 25 MCG PO TABS
50.0000 ug | ORAL_TABLET | Freq: Every day | ORAL | Status: DC
  Administered 2017-05-26 – 2017-06-01 (×7): 50 ug via ORAL
  Filled 2017-05-25 (×2): qty 1
  Filled 2017-05-25 (×2): qty 2
  Filled 2017-05-25: qty 1
  Filled 2017-05-25: qty 2
  Filled 2017-05-25: qty 1

## 2017-05-25 MED ORDER — ACETAMINOPHEN 325 MG PO TABS
325.0000 mg | ORAL_TABLET | ORAL | Status: DC | PRN
  Administered 2017-05-26 – 2017-05-31 (×5): 650 mg via ORAL
  Filled 2017-05-25 (×5): qty 2

## 2017-05-25 MED ORDER — METOCLOPRAMIDE HCL 5 MG PO TABS
5.0000 mg | ORAL_TABLET | Freq: Two times a day (BID) | ORAL | Status: DC
  Administered 2017-05-26 – 2017-06-01 (×13): 5 mg via ORAL
  Filled 2017-05-25 (×13): qty 1

## 2017-05-25 MED ORDER — CYCLOBENZAPRINE HCL 10 MG PO TABS
10.0000 mg | ORAL_TABLET | Freq: Three times a day (TID) | ORAL | Status: DC | PRN
  Administered 2017-05-30 – 2017-06-01 (×5): 10 mg via ORAL
  Filled 2017-05-25 (×5): qty 1

## 2017-05-25 MED ORDER — PANTOPRAZOLE SODIUM 40 MG PO TBEC
40.0000 mg | DELAYED_RELEASE_TABLET | Freq: Two times a day (BID) | ORAL | Status: DC
  Administered 2017-05-25 – 2017-06-01 (×14): 40 mg via ORAL
  Filled 2017-05-25 (×14): qty 1

## 2017-05-25 MED ORDER — INSULIN ASPART 100 UNIT/ML ~~LOC~~ SOLN
0.0000 [IU] | Freq: Three times a day (TID) | SUBCUTANEOUS | Status: DC
  Administered 2017-05-25 – 2017-05-27 (×2): 3 [IU] via SUBCUTANEOUS
  Administered 2017-05-28: 5 [IU] via SUBCUTANEOUS
  Administered 2017-05-28 – 2017-05-30 (×3): 2 [IU] via SUBCUTANEOUS

## 2017-05-25 MED ORDER — ALBUTEROL SULFATE (2.5 MG/3ML) 0.083% IN NEBU: 2.5000 mg | INHALATION_SOLUTION | Freq: Four times a day (QID) | RESPIRATORY_TRACT | Status: DC | PRN

## 2017-05-25 MED ORDER — FLUTICASONE PROPIONATE 50 MCG/ACT NA SUSP
1.0000 | Freq: Every day | NASAL | Status: DC
  Administered 2017-05-27 – 2017-06-01 (×6): 1 via NASAL
  Filled 2017-05-25: qty 16

## 2017-05-25 MED ORDER — BISACODYL 10 MG RE SUPP: 10.0000 mg | Freq: Every day | RECTAL | Status: DC | PRN

## 2017-05-25 MED ORDER — DULOXETINE HCL 30 MG PO CPEP
30.0000 mg | ORAL_CAPSULE | Freq: Every day | ORAL | Status: DC
  Administered 2017-05-26 – 2017-06-01 (×7): 30 mg via ORAL
  Filled 2017-05-25 (×7): qty 1

## 2017-05-25 MED ORDER — INSULIN ASPART 100 UNIT/ML ~~LOC~~ SOLN: 0.0000 [IU] | Freq: Three times a day (TID) | SUBCUTANEOUS | Status: DC

## 2017-05-25 MED ORDER — SORBITOL 70 % SOLN: 45.0000 mL | Freq: Every day | Status: DC | PRN

## 2017-05-25 MED ORDER — PROCHLORPERAZINE 25 MG RE SUPP: 12.5000 mg | Freq: Four times a day (QID) | RECTAL | Status: DC | PRN

## 2017-05-25 MED ORDER — INSULIN ASPART 100 UNIT/ML ~~LOC~~ SOLN
0.0000 [IU] | Freq: Every day | SUBCUTANEOUS | Status: DC
  Administered 2017-05-30: 2 [IU] via SUBCUTANEOUS

## 2017-05-25 MED ORDER — CALCITRIOL 0.25 MCG PO CAPS
1.2500 ug | ORAL_CAPSULE | ORAL | Status: DC
  Administered 2017-05-28 – 2017-06-01 (×3): 1.25 ug via ORAL
  Filled 2017-05-25: qty 5
  Filled 2017-05-25: qty 1

## 2017-05-25 MED ORDER — DARBEPOETIN ALFA 100 MCG/0.5ML IJ SOSY
100.0000 ug | PREFILLED_SYRINGE | INTRAMUSCULAR | Status: DC
  Filled 2017-05-25: qty 0.5

## 2017-05-25 MED ORDER — CALCIUM ACETATE (PHOS BINDER) 667 MG PO CAPS
2001.0000 mg | ORAL_CAPSULE | Freq: Three times a day (TID) | ORAL | Status: DC
  Administered 2017-05-25 – 2017-06-01 (×19): 2001 mg via ORAL
  Filled 2017-05-25 (×19): qty 3

## 2017-05-25 NOTE — Progress Notes (Signed)
Physical Medicine and Rehabilitation Consult   Reason for Consult: Debility Referring Physician: Dr. Renaldo Reel   HPI: STRYKER VEASEY is a 47 y.o. male with history of ESRD- HD MWF, CAD, T2DM--poorly controlled with gastroparesis and peripheral neuropathy, CIDP- monthly IVIG, gait disorder and asthma; who was admitted on 05/20/17 with N/V, SOB and left sided chest pain. History taken from chart review and patient. Last dialysis session on Friday before and he was found to have pulmonary edema, hyperkalemia and hyperglycemia. He was treated with IV calcium gluconate and HD with improvement in respiratory symptoms. He continues to have issues with abdominal discomfort and nausea as well as concerns about need for multiple medications. Therapy ongoing and CIR recommended due to functional deficits.   Review of Systems  Constitutional: Positive for malaise/fatigue. Negative for chills and fever.  Respiratory: Positive for shortness of breath.   Musculoskeletal: Positive for back pain and myalgias.  Neurological: Positive for sensory change and weakness.  All other systems reviewed and are negative.       Past Medical History:  Diagnosis Date  . Anxiety   . Asthma   . 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 Daily headache   . Depression   . DVT (deep venous thrombosis), H/o 01/2014-on Xarelto 03/24/2014   LLE  . ESRD (end stage renal disease) on dialysis [facility]    "[place], MWF" (06/29/2016)  . Gait abnormality 12/22/2016  . Gastroparesis 12/22/2016  . Hypertension   . Hypothyroidism   . Nephrotic syndrome 05/18/2014  . Neuropathy   . Type 2 diabetes mellitus with diabetic nephropathy [facility])          Past Surgical History:  Procedure Laterality Date    . ANKLE FRACTURE SURGERY Right 1988  . AV FISTULA PLACEMENT Left 01/01/2015   Procedure: CREATION OF LEFT RADIAL CEPHALIC ARTERIOVENOUS (AV) FISTULA ;  Surgeon: Pryor Ochoa, MD;  Location: [facility] OR;  Service: Vascular;  Laterality: Left;  . CAPD REMOVAL N/A 05/19/2016   Procedure: PD CATH REMOVAL;  Surgeon: Abigail Miyamoto, MD;  Location: [facility] OR;  Service: General;  Laterality: N/A;  . CARDIAC CATHETERIZATION N/A 09/22/2015   Procedure: Left Heart Cath and Coronary Angiography;  Surgeon: Iran Ouch, MD;  Location: [facility] INVASIVE CV LAB;  Service: Cardiovascular;  Laterality: N/A;  . CORONARY ARTERY BYPASS GRAFT N/A 09/28/2015   Procedure: CORONARY ARTERY BYPASS GRAFTING (CABG) x 3 UTILIZING LEFT MAMMARY ARTERY AND ENDOSCOPICALLY HARVESTED LEFT GREATER SAPHENOUS VEIN.;  Surgeon: Delight Ovens, MD;  Location: [facility] OR;  Service: Open Heart Surgery;  Laterality: N/A;  . ENDOVEIN HARVEST OF GREATER SAPHENOUS VEIN Left 09/28/2015   Procedure: ENDOVEIN HARVEST OF GREATER SAPHENOUS VEIN;  Surgeon: Delight Ovens, MD;  Location: [facility] OR;  Service: Open Heart Surgery;  Laterality: Left;  . ESOPHAGOGASTRODUODENOSCOPY N/A 03/29/2014   Procedure: ESOPHAGOGASTRODUODENOSCOPY (EGD);  Surgeon: Dorena Cookey, MD;  Location: [facility]COPY;  Service: Endoscopy;  Laterality: N/A;  . EYE SURGERY    . FRACTURE SURGERY    . INSERTION OF DIALYSIS CATHETER Right 01/05/2015   Procedure: INSERTION OF RIGHT INTERNAL JUGULAR DIALYSIS CATHETER;  Surgeon: Fransisco Hertz, MD;  Location: [facility] OR;  Service: Vascular;  Laterality: Right;  . LEFT HEART CATH AND CORS/GRAFTS ANGIOGRAPHY N/A 09/13/2016   Procedure: LEFT HEART CATH AND  CORS/GRAFTS ANGIOGRAPHY;  Surgeon: Kathleene Hazel, MD;  Location: [facility] INVASIVE CV LAB;  Service: Cardiovascular;  Laterality: N/A;  . RETINAL LASER PROCEDURE Bilateral   . RIGHT/LEFT HEART CATH AND CORONARY/GRAFT ANGIOGRAPHY N/A 03/06/2017   Procedure: RIGHT/LEFT HEART CATH AND  CORONARY/GRAFT ANGIOGRAPHY;  Surgeon: Marykay Lex, MD;  Location: [facility] INVASIVE CV LAB;  Service: Cardiovascular;  Laterality: N/A;  . TEE WITHOUT CARDIOVERSION N/A 09/28/2015   Procedure: TRANSESOPHAGEAL ECHOCARDIOGRAM (TEE);  Surgeon: Delight Ovens, MD;  Location: [facility] OR;  Service: Open Heart Surgery;  Laterality: N/A;  . TONSILLECTOMY AND ADENOIDECTOMY  1970s         Family History  Problem Relation Age of Onset  . Obesity Mother        Patient states that family members have no other medical illnesses other than what I have described  . Kidney cancer Maternal Grandmother   . Cancer Father 50       AML  . Heart disease Unknown        No family history    Social History:  reports that he has never smoked. He has never used smokeless tobacco. He reports that he drinks alcohol. He reports that he does not use drugs.         Allergies  Allergen Reactions  . Nsaids Other (See Comments)    Told to avoid all nsaids due to kidney disease   . Tape Other (See Comments)    Welts result, if left for a long amount of time          Medications Prior to Admission  Medication Sig Dispense Refill  . acetaminophen (TYLENOL) 500 MG tablet Take 1 tablet (500 mg total) by mouth every 6 (six) hours as needed for moderate pain. (Patient taking differently: Take 1,000 mg by mouth every 6 (six) hours as needed (for pain). ) 30 tablet 0  . albuterol (PROVENTIL HFA;VENTOLIN HFA) 108 (90 Base) MCG/ACT inhaler Inhale 1 puff into the lungs every 6 (six) hours as needed for wheezing or shortness of breath. (Patient taking differently: Inhale 2 puffs into the lungs every 8 (eight) hours as needed for wheezing or shortness of breath. ) 1 Inhaler 11  . aspirin EC 81 MG EC tablet Take 1 tablet (81 mg total) by mouth daily.    Marland Kitchen atorvastatin (LIPITOR) 80 MG tablet Take 1 tablet (80 mg total) by mouth daily at 6 PM. 90 tablet 3  . cinacalcet (SENSIPAR) 30 MG tablet Take 30 mg by mouth  every Monday, Wednesday, and Friday.    . cyclobenzaprine (FLEXERIL) 10 MG tablet Take 1 tablet (10 mg total) by mouth 3 (three) times daily as needed for muscle spasms. 30 tablet 0  . ferric gluconate 62.5 mg in sodium chloride 0.9 % 100 mL Inject 62.5 mg into the vein every Wednesday with hemodialysis.    . fluticasone (FLONASE) 50 MCG/ACT nasal spray Place 1 spray into both nostrils daily. 1 g 0  . gabapentin (NEURONTIN) 100 MG capsule Take 2 capsules (200 mg total) by mouth 3 (three) times daily. 180 capsule 0  . hydrALAZINE (APRESOLINE) 25 MG tablet Take 1 tablet (25 mg total) by mouth 3 (three) times daily. (Patient taking differently: Take 25 mg by mouth as needed. ) 90 tablet 0  . HYDROcodone-acetaminophen (NORCO/VICODIN) 5-325 MG tablet Take 1-2 tablets by mouth every 4 (four) hours as needed for moderate pain. 30 tablet 0  . Immune Globulin, Human, (PRIVIGEN) 40 GM/400ML SOLN Inject 40 g  into the vein every 30 (thirty) days. 40gm/dayx 5 days and then 40gm q4 weeks     . insulin aspart (NOVOLOG FLEXPEN) 100 UNIT/ML FlexPen Inject 8-20 Units into the skin 3 (three) times daily with meals. (Patient taking differently: Inject 8-20 Units into the skin 3 (three) times daily with meals. Sliding scale 5 units per carb.) 15 mL 11  . Insulin Glargine (BASAGLAR KWIKPEN) 100 UNIT/ML SOPN Inject 30 units 2x a day under skin (Patient taking differently: Inject 30 Units into the skin 2 (two) times daily. Inject 30 units 2x a day under skin) 15 mL 3  . levothyroxine (SYNTHROID, LEVOTHROID) 50 MCG tablet Take 1 tablet (50 mcg total) by mouth daily before breakfast. 45 tablet 3  . metoCLOPramide (REGLAN) 5 MG tablet Take 1 tablet (5 mg total) by mouth every 8 (eight) hours as needed for nausea or vomiting. 30 tablet 0  . sucralfate (CARAFATE) 1 g tablet Take 1 tablet (1 g total) by mouth 4 (four) times daily. (Patient taking differently: Take 1 g by mouth as needed. ) 60 tablet 0  . DULoxetine (CYMBALTA)  30 MG capsule Take 1 capsule (30 mg total) by mouth daily. 30 capsule 3  . metoprolol succinate (TOPROL-XL) 25 MG 24 hr tablet Take 1.5 tablets (37.5 mg total) by mouth 2 (two) times daily. 60 tablet 0  . mycophenolate (CELLCEPT) 250 MG capsule Take 1 capsule (250 mg total) by mouth 2 (two) times daily. 60 capsule 3  . Semaglutide (OZEMPIC) 0.25 or 0.5 MG/DOSE SOPN Inject 0.5 mg into the skin once a week. 2 pen 5    Home: Home Living Family/patient expects to be discharged to:: Private residence Living Arrangements: Parent Available Help at Discharge: Family, Available 24 hours/day Type of Home: House Home Access: Stairs to enter [employer] of Steps: 1 Entrance Stairs-Rails: None Home Layout: One level Bathroom Shower/Tub: Engineer, manufacturing systems: Standard Bathroom Accessibility: Yes Home Equipment: Cane - single point, Bedside commode Additional Comments: log cabin,   Functional History: Prior Function Level of Independence: Independent with assistive device(s) Comments: SPC recently with neuropathy Functional Status:  Mobility: Bed Mobility Overal bed mobility: Modified Independent General bed mobility comments: railiing to sit up Transfers Overall transfer level: Needs assistance Equipment used: Rolling walker (2 wheeled) Transfers: Sit to/from Stand Sit to Stand: Mod assist, Max assist General transfer comment: dense assistance with elevation of bed Ambulation/Gait Ambulation/Gait assistance: Min assist Ambulation Distance (Feet): 50 Feet Assistive device: None(use of handrails in hallway) Gait Pattern/deviations: Step-through pattern, Ataxic, Staggering left, Staggering right, Wide base of support, Decreased dorsiflexion - right, Decreased dorsiflexion - left General Gait Details: unable to step Gait velocity: slowed Gait velocity interpretation: <1.8 ft/sec, indicate of risk for recurrent falls  ADL: ADL Overall ADL's : Needs  assistance/impaired Eating/Feeding: Minimal assistance, Sitting Grooming: Minimal assistance, Sitting Upper Body Bathing: Minimal assistance, Sitting Lower Body Bathing: Moderate assistance, Sit to/from stand, Cueing for sequencing, Cueing for safety Upper Body Dressing : Minimal assistance, Standing Lower Body Dressing: Maximal assistance, Sit to/from stand, Cueing for sequencing, Cueing for safety Toilet Transfer: Moderate assistance, Comfort height toilet, RW Toileting- Clothing Manipulation and Hygiene: Moderate assistance, Sit to/from stand  Cognition: Cognition Overall Cognitive Status: Within Functional Limits for tasks assessed Orientation Level: Oriented X4 Cognition Arousal/Alertness: Awake/alert Behavior During Therapy: WFL for tasks assessed/performed Overall Cognitive Status: Within Functional Limits for tasks assessed   Blood pressure 105/68, pulse 74, temperature 97.7 F (36.5 C), temperature source Oral, resp. rate 16, height   (1.88 m), weight 117.3 kg (258 lb 9.6 oz), SpO2 99 %. Physical Exam  Vitals reviewed. Constitutional: He is oriented to person, place, and time. He appears well-developed.  obese  HENT:  Head: Normocephalic and atraumatic.  Eyes: EOM are normal. Right eye exhibits no discharge. Left eye exhibits no discharge.  Neck: Normal range of motion. Neck supple.  Cardiovascular: Normal rate and regular rhythm.  Respiratory: Effort normal and breath sounds normal.  + [place]  GI: Soft. Bowel sounds are normal.  Musculoskeletal:  Bilateral lower extremity edema  Neurological: He is alert and oriented to person, place, and time.  Sensation diminished to light touch bilateral hands as well as bilateral lower extremity distal to cast Motor: Right upper extremity: 4+/5 proximal to distal Left upper extremity: 4/5 proximal to distal Bilateral lower extremities: Hip flexion 4-/5, knee extension 4-/5, ankle dorsiflexion 4 -/5  Skin: Skin is warm and  dry.  Psychiatric: He has a normal mood and affect. His behavior is normal.    LabResultsLast24Hours       Results for orders placed or performed during the hospital encounter of 05/20/17 (from the past 24 hour(s))  Glucose, capillary     Status: Abnormal   Collection Time: 05/22/17 11:48 AM  Result Value Ref Range   Glucose-Capillary 132 (H) 65 - 99 mg/dL  Glucose, capillary     Status: Abnormal   Collection Time: 05/22/17  4:39 PM  Result Value Ref Range   Glucose-Capillary 190 (H) 65 - 99 mg/dL  Glucose, capillary     Status: Abnormal   Collection Time: 05/22/17  9:00 PM  Result Value Ref Range   Glucose-Capillary 202 (H) 65 - 99 mg/dL  CBC     Status: Abnormal   Collection Time: 05/23/17  8:28 AM  Result Value Ref Range   WBC 7.2 4.0 - 10.5 K/uL   RBC 2.65 (L) 4.22 - 5.81 MIL/uL   Hemoglobin 8.0 (L) 13.0 - 17.0 g/dL   HCT 96.2 (L) 95.2 - 84.1 %   MCV 94.0 78.0 - 100.0 fL   MCH 30.2 26.0 - 34.0 pg   MCHC 32.1 30.0 - 36.0 g/dL   RDW 32.4 40.1 - 02.7 %   Platelets 281 150 - 400 K/uL  Basic metabolic panel     Status: Abnormal   Collection Time: 05/23/17  8:28 AM  Result Value Ref Range   Sodium 133 (L) 135 - 145 mmol/L   Potassium 5.2 (H) 3.5 - 5.1 mmol/L   Chloride 93 (L) 101 - 111 mmol/L   CO2 26 22 - 32 mmol/L   Glucose, Bld 186 (H) 65 - 99 mg/dL   BUN 54 (H) 6 - 20 mg/dL   Creatinine, Ser 25.36 (H) 0.61 - 1.24 mg/dL   Calcium 8.7 (L) 8.9 - 10.3 mg/dL   GFR calc non Af Amer 5 (L) >60 mL/min   GFR calc Af Amer 6 (L) >60 mL/min   Anion gap 14 5 - 15     ImagingResults(Last48hours)  No results found.    Assessment/Plan: Diagnosis: Debility Labs and images independently reviewed.  Records reviewed and summated above.  1. Does the need for close, 24 hr/day medical supervision in concert with the patient's rehab needs make it unreasonable for this patient to be served in a less intensive setting? Yes  2. Co-Morbidities  requiring supervision/potential complications: ESRD (recs per nephro), CAD (continue meds), T2 DM--poorly controlled with gastroparesis and peripheral neuropathy (Monitor in accordance with exercise and  adjust meds as necessary), CIDP (continue monthly IVIG), gait disorder, asthma (monitor respiratory rate and O2 sats with increased exertion), hypothyroidism (cont meds, ensure appropriate mood and energy level for therapies)from acute on chronic anemia (transfuse if necessary to ensure appropriate perfusion for increased activity tolerance) supplement oxygen dependent (wean as tolerated)  3. Due to bowel management, safety, skin/wound care, disease management and patient education, does the patient require 24 hr/day rehab nursing? Yes 4. Does the patient require coordinated care of a physician, rehab nurse, PT (1-2 hrs/day, 5 days/week) and OT (1-2 hrs/day, 5 days/week) to address physical and functional deficits in the context of the above medical diagnosis(es)? Yes Addressing deficits in the following areas: balance, endurance, locomotion, strength, transferring, bowel/bladder control, bathing, dressing, feeding, grooming, toileting and psychosocial support 5. Can the patient actively participate in an intensive therapy program of at least 3 hrs of therapy per day at least 5 days per week? Yes 6. The potential for patient to make measurable gains while on inpatient rehab is excellent 7. Anticipated functional outcomes upon discharge from inpatient rehab are supervision and min assist  with PT, supervision and min assist with OT, n/a with SLP. 8. Estimated rehab length of stay to reach the above functional goals is: 15-19 days. 9. Anticipated D/C setting: Home 10. Anticipated post D/C treatments: HH therapy and Home excercise program 11. Overall Rehab/Functional Prognosis: good  RECOMMENDATIONS: This patient's condition is appropriate for continued rehabilitative care in the following setting: CIR if  adequate caregiver support available on discharge. Patient has agreed to participate in recommended program. Yes Note that insurance prior authorization may be required for reimbursement for recommended care.  Comment: Rehab Admissions Coordinator to follow up.   I have personally performed a face to face diagnostic evaluation, including, but not limited to relevant history and physical exam findings, of this patient and developed relevant assessment and plan.  Additionally, I have reviewed and concur with the physician assistant's documentation above.   Maryla Morrow, MD, ABPMR Jacquelynn Cree, PA-C 05/23/2017      Revision History                             Routing History

## 2017-05-25 NOTE — Care Management Important Message (Signed)
Important Message  Patient Details  Name: Russell [name] MRN: [id number] Date of Birth: October 19, 1970   Medicare Important Message Given:  Yes    Zane Samson P Kyrin Gratz 05/25/2017, 3:28 PM

## 2017-05-25 NOTE — H&P (Signed)
Physical Medicine and Rehabilitation Admission H&P    Chief Complaint  Patient presents with  . 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 History taken from chart review and patient. Last dialysis on Friday before admission. He was noted to have pulmonary edema with hyperkalemia and hyperglycemia. He was treated with IV calcium gluconate and HD.  He continues to report SOB with request for oxygen requiring oxygen as well as non-specific chest discomfort. N/V has resolved and po intake good. He was noted to be deconditioned and CIR recommended due to functional deficits.    Review of Systems  Constitutional: Negative for chills and fever.  HENT: Negative for hearing loss and tinnitus.   Eyes: Positive for blurred vision (due to recent surgery).  Respiratory: Positive for shortness of breath (unchanged since admission.).   Cardiovascular: Negative for chest pain and palpitations.  Gastrointestinal: Negative for abdominal pain, constipation and heartburn.  Genitourinary: Negative for dysuria.  Musculoskeletal: Negative for back pain.  Skin: Negative for rash.  Neurological: Positive for sensory change (BLE and bilateral hands) and weakness. Negative for dizziness and headaches.  Psychiatric/Behavioral: The patient is not nervous/anxious and does not have insomnia.   All other systems reviewed and are negative.     Past Medical History:  Diagnosis Date  . Anxiety   . Asthma   . CIDP (chronic inflammatory demyelinating polyneuropathy) ([place]) 01/10/2017  . 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 Daily headache   . Depression     . DVT (deep venous thrombosis), H/o 01/2014-on Xarelto 03/24/2014   LLE  . ESRD (end stage renal disease) on dialysis [facility])    "[place], MWF" (06/29/2016)  . Gait abnormality 12/22/2016  . Gastroparesis 12/22/2016  . Hypertension   . Hypothyroidism   . Nephrotic syndrome 05/18/2014  . Neuropathy   . Type 2 diabetes mellitus with diabetic nephropathy [facility])     Past Surgical History:  Procedure Laterality Date  . ANKLE FRACTURE SURGERY Right 1988  . AV FISTULA PLACEMENT Left 01/01/2015   Procedure: CREATION OF LEFT RADIAL CEPHALIC ARTERIOVENOUS (AV) FISTULA ;  Surgeon: Mal Misty, MD;  Location: [place];  Service: Vascular;  Laterality: Left;  . CAPD REMOVAL N/A 05/19/2016   Procedure: PD CATH REMOVAL;  Surgeon: Coralie Keens, MD;  Location: [place];  Service: General;  Laterality: N/A;  . CARDIAC CATHETERIZATION N/A 09/22/2015   Procedure: Left Heart Cath and Coronary Angiography;  Surgeon: Wellington [place], MD;  Location: [place] CV [facility];  Service: Cardiovascular;  Laterality: N/A;  . CORONARY ARTERY BYPASS GRAFT N/A 09/28/2015   Procedure: CORONARY ARTERY BYPASS GRAFTING (CABG) x 3 UTILIZING LEFT MAMMARY ARTERY AND ENDOSCOPICALLY HARVESTED LEFT GREATER SAPHENOUS VEIN.;  Surgeon: [name], MD;  Location: [place];  Service: Open Heart Surgery;  Laterality: N/A;  . ENDOVEIN HARVEST OF GREATER SAPHENOUS VEIN Left 09/28/2015   Procedure: ENDOVEIN HARVEST OF GREATER SAPHENOUS VEIN;  Surgeon: [name], MD;  Location: [place];  Service: Open Heart Surgery;  Laterality: Left;  . ESOPHAGOGASTRODUODENOSCOPY N/A 03/29/2014   Procedure: ESOPHAGOGASTRODUODENOSCOPY (EGD);  Surgeon: Jenny Reichmann  Amedeo Plenty, MD;  Location: [facility];  Service: Endoscopy;  Laterality: N/A;  . EYE SURGERY    . FRACTURE SURGERY    . INSERTION OF DIALYSIS CATHETER Right 01/05/2015   Procedure: INSERTION OF RIGHT INTERNAL JUGULAR DIALYSIS CATHETER;  Surgeon: Conrad Edgewood, MD;  Location: [place];  Service: Vascular;   Laterality: Right;  . LEFT HEART CATH AND CORS/GRAFTS ANGIOGRAPHY N/A 09/13/2016   Procedure: LEFT HEART CATH AND CORS/GRAFTS ANGIOGRAPHY;  Surgeon: Burnell Blanks, MD;  Location: [employer] [facility];  Service: Cardiovascular;  Laterality: N/A;  . RETINAL LASER PROCEDURE Bilateral   . RIGHT/LEFT HEART CATH AND CORONARY/GRAFT ANGIOGRAPHY N/A 03/06/2017   Procedure: RIGHT/LEFT HEART CATH AND CORONARY/GRAFT ANGIOGRAPHY;  Surgeon: Leonie Man, MD;  Location: [place] [facility];  Service: Cardiovascular;  Laterality: N/A;  . TEE WITHOUT CARDIOVERSION N/A 09/28/2015   Procedure: TRANSESOPHAGEAL ECHOCARDIOGRAM (TEE);  Surgeon: [name], MD;  Location: [place];  Service: Open Heart Surgery;  Laterality: N/A;  . TONSILLECTOMY AND ADENOIDECTOMY  1970s    Family History  Problem Relation Age of Onset  . Obesity Mother        Patient states that family members have no other medical illnesses other than what I have described  . Kidney cancer Maternal Grandmother   . Cancer Father 52       AML  . Heart disease Unknown        No family history    Social History:  Lives with parents. Independent PTA. He  reports that he has never smoked. He has never used smokeless tobacco. He reports that he drinks alcohol. He reports that he does not use drugs.    Allergies  Allergen Reactions  . Nsaids Other (See Comments)    Told to avoid all nsaids due to kidney disease   . Tape Other (See Comments)    Welts result, if left for a long amount of time   Medications Prior to Admission  Medication Sig Dispense Refill  . acetaminophen (TYLENOL) 500 MG tablet Take 1 tablet (500 mg total) by mouth every 6 (six) hours as needed for moderate pain. (Patient taking differently: Take 1,000 mg by mouth every 6 (six) hours as needed (for pain). ) 30 tablet 0  . albuterol (PROVENTIL HFA;VENTOLIN HFA) 108 (90 Base) MCG/ACT inhaler Inhale 1 puff into the lungs every 6 (six) hours as needed for wheezing or  shortness of breath. (Patient taking differently: Inhale 2 puffs into the lungs every 8 (eight) hours as needed for wheezing or shortness of breath. ) 1 Inhaler 11  . aspirin EC 81 MG EC tablet Take 1 tablet (81 mg total) by mouth daily.    Marland Kitchen atorvastatin (LIPITOR) 80 MG tablet Take 1 tablet (80 mg total) by mouth daily at 6 PM. 90 tablet 3  . cinacalcet (SENSIPAR) 30 MG tablet Take 30 mg by mouth every Monday, Wednesday, and Friday.    . cyclobenzaprine (FLEXERIL) 10 MG tablet Take 1 tablet (10 mg total) by mouth 3 (three) times daily as needed for muscle spasms. 30 tablet 0  . ferric gluconate 62.5 mg in sodium chloride 0.9 % 100 mL Inject 62.5 mg into the vein every Wednesday with hemodialysis.    . fluticasone (FLONASE) 50 MCG/ACT nasal spray Place 1 spray into both nostrils daily. 1 g 0  . gabapentin (NEURONTIN) 100 MG capsule Take 2 capsules (200 mg total) by mouth 3 (three) times daily. 180 capsule 0  . hydrALAZINE (APRESOLINE)  25 MG tablet Take 1 tablet (25 mg total) by mouth 3 (three) times daily. (Patient taking differently: Take 25 mg by mouth as needed. ) 90 tablet 0  . HYDROcodone-acetaminophen (NORCO/VICODIN) 5-325 MG tablet Take 1-2 tablets by mouth every 4 (four) hours as needed for moderate pain. 30 tablet 0  . Immune Globulin, Human, (PRIVIGEN) 40 GM/400ML SOLN Inject 40 g into the vein every 30 (thirty) days. 40gm/dayx 5 days and then 40gm q4 weeks     . insulin aspart (NOVOLOG FLEXPEN) 100 UNIT/ML FlexPen Inject 8-20 Units into the skin 3 (three) times daily with meals. (Patient taking differently: Inject 8-20 Units into the skin 3 (three) times daily with meals. Sliding scale 5 units per carb.) 15 mL 11  . Insulin Glargine (BASAGLAR KWIKPEN) 100 UNIT/ML SOPN Inject 30 units 2x a day under skin (Patient taking differently: Inject 30 Units into the skin 2 (two) times daily. Inject 30 units 2x a day under skin) 15 mL 3  . levothyroxine (SYNTHROID, LEVOTHROID) 50 MCG tablet Take 1  tablet (50 mcg total) by mouth daily before breakfast. 45 tablet 3  . metoCLOPramide (REGLAN) 5 MG tablet Take 1 tablet (5 mg total) by mouth every 8 (eight) hours as needed for nausea or vomiting. 30 tablet 0  . sucralfate (CARAFATE) 1 g tablet Take 1 tablet (1 g total) by mouth 4 (four) times daily. (Patient taking differently: Take 1 g by mouth as needed. ) 60 tablet 0  . DULoxetine (CYMBALTA) 30 MG capsule Take 1 capsule (30 mg total) by mouth daily. 30 capsule 3  . metoprolol succinate (TOPROL-XL) 25 MG 24 hr tablet Take 1.5 tablets (37.5 mg total) by mouth 2 (two) times daily. 60 tablet 0  . mycophenolate (CELLCEPT) 250 MG capsule Take 1 capsule (250 mg total) by mouth 2 (two) times daily. 60 capsule 3  . Semaglutide (OZEMPIC) 0.25 or 0.5 MG/DOSE SOPN Inject 0.5 mg into the skin once a week. 2 pen 5    Drug Regimen Review  Drug regimen was reviewed and remains appropriate with no significant issues identified  Home: Home Living Family/patient expects to be discharged to:: Private residence Living Arrangements: Parent Available Help at Discharge: Family, Available 24 hours/day Type of Home: House Home Access: Stairs to enter [employer] of Steps: 1 Entrance Stairs-Rails: None Home Layout: One level Bathroom Shower/Tub: Chiropodist: Standard Bathroom Accessibility: Yes Home Equipment: Cane - single point, Bedside commode Additional Comments: log cabin,    Functional History: Prior Function Level of Independence: Independent with assistive device(s) Comments: SPC recently with neuropathy  Functional Status:  Mobility: Bed Mobility Overal bed mobility: Modified Independent General bed mobility comments: used rails Transfers Overall transfer level: Needs assistance Equipment used: Rolling walker (2 wheeled) Transfers: Sit to/from Stand Sit to Stand: Mod assist, Min assist General transfer comment: bed elevated, mod A fro bed, min A for toilet  using grab bar Ambulation/Gait Ambulation/Gait assistance: Min assist Ambulation Distance (Feet): 50 Feet Assistive device: None(use of handrails in hallway) Gait Pattern/deviations: Step-through pattern, Ataxic, Staggering left, Staggering right, Wide base of support, Decreased dorsiflexion - right, Decreased dorsiflexion - left General Gait Details: unable to step Gait velocity: slowed Gait velocity interpretation: <1.8 ft/sec, indicate of risk for recurrent falls    ADL: ADL Overall ADL's : Needs assistance/impaired Eating/Feeding: Minimal assistance, Sitting Grooming: Minimal assistance, Standing, Wash/dry hands, Wash/dry face Upper Body Bathing: Sitting, Min guard Upper Body Bathing Details (indicate cue type and reason): simulated Lower Body  Bathing: Moderate assistance, Sit to/from stand, Cueing for sequencing, Cueing for safety Upper Body Dressing : Minimal assistance, Standing Lower Body Dressing: Sit to/from stand, Cueing for sequencing, Cueing for safety, Moderate assistance, Sitting/lateral leans Toilet Transfer: Comfort height toilet, RW, Minimal assistance, Grab bars, Ambulation Toilet Transfer Details (indicate cue type and reason): RW used to bathroom, HHA on way back from bathroom Toileting- Clothing Manipulation and Hygiene: Sit to/from stand, Minimal assistance Functional mobility during ADLs: Moderate assistance, Minimal assistance  Cognition: Cognition Overall Cognitive Status: Within Functional Limits for tasks assessed Orientation Level: Oriented X4 Cognition Arousal/Alertness: Awake/alert Behavior During Therapy: WFL for tasks assessed/performed Overall Cognitive Status: Within Functional Limits for tasks assessed  Physical Exam: Blood pressure 128/84, pulse 79, temperature 97.9 F (36.6 C), temperature source Oral, resp. rate 20, height _0  (1.88 m), weight 115.1 kg (253 lb 12 oz), SpO2 99 %. Physical Exam  Vitals reviewed. Constitutional: He is  oriented to person, place, and time. He appears well-developed.  Obese  HENT:  Head: Normocephalic and atraumatic.  Eyes: EOM are normal. Right eye exhibits no discharge. Left eye exhibits no discharge.  Neck: Normal range of motion. Neck supple.  Cardiovascular: Normal rate and regular rhythm.  Respiratory: Effort normal and breath sounds normal.  +Lena  GI: Soft. Bowel sounds are normal.  Musculoskeletal:  No edema or tenderness in extremities  Neurological: He is alert and oriented to person, place, and time.  Motor: 4-/5 throughout Sensation diminished to light touch bilateral hands as well as bilateral lower extremity distal to calf  Skin:  Scattered scabs  Psychiatric: He has a normal mood and affect. His behavior is normal.    Results for orders placed or performed during the hospital encounter of 05/20/17 (from the past 48 hour(s))  Glucose, capillary     Status: Abnormal   Collection Time: 05/22/17  4:39 PM  Result Value Ref Range   Glucose-Capillary 190 (H) 65 - 99 mg/dL  Glucose, capillary     Status: Abnormal   Collection Time: 05/22/17  9:00 PM  Result Value Ref Range   Glucose-Capillary 202 (H) 65 - 99 mg/dL  CBC     Status: Abnormal   Collection Time: 05/23/17  8:28 AM  Result Value Ref Range   WBC 7.2 4.0 - 10.5 K/uL   RBC 2.65 (L) 4.22 - 5.81 MIL/uL   Hemoglobin 8.0 (L) 13.0 - 17.0 g/dL   HCT 24.9 (L) 39.0 - 52.0 %   MCV 94.0 78.0 - 100.0 fL   MCH 30.2 26.0 - 34.0 pg   MCHC 32.1 30.0 - 36.0 g/dL   RDW 14.6 11.5 - 15.5 %   Platelets 281 150 - 400 K/uL    Comment: Performed at [facility], [place] [address] [place], [place] [address]  Basic metabolic panel     Status: Abnormal   Collection Time: 05/23/17  8:28 AM  Result Value Ref Range   Sodium 133 (L) 135 - 145 mmol/L   Potassium 5.2 (H) 3.5 - 5.1 mmol/L   Chloride 93 (L) 101 - 111 mmol/L   CO2 26 22 - 32 mmol/L   Glucose, Bld 186 (H) 65 - 99 mg/dL   BUN 54 (H) 6 - 20 mg/dL   Creatinine, Ser  10.34 (H) 0.61 - 1.24 mg/dL   Calcium 8.7 (L) 8.9 - 10.3 mg/dL   GFR calc non Af Amer 5 (L) >60 mL/min   GFR calc Af Amer 6 (L) >60 mL/min  Comment: (NOTE) The eGFR has been calculated using the CKD EPI equation. This calculation has not been validated in all clinical situations. eGFR's persistently <60 mL/min signify possible Chronic Kidney Disease.    Anion gap 14 5 - 15    Comment: Performed at [facility] [address], [place], [place] [address]  Glucose, capillary     Status: Abnormal   Collection Time: 05/23/17 12:25 PM  Result Value Ref Range   Glucose-Capillary 180 (H) 65 - 99 mg/dL  Glucose, capillary     Status: Abnormal   Collection Time: 05/23/17  4:26 PM  Result Value Ref Range   Glucose-Capillary 187 (H) 65 - 99 mg/dL  Glucose, capillary     Status: Abnormal   Collection Time: 05/23/17  9:07 PM  Result Value Ref Range   Glucose-Capillary 105 (H) 65 - 99 mg/dL  Glucose, capillary     Status: Abnormal   Collection Time: 05/24/17  7:29 AM  Result Value Ref Range   Glucose-Capillary 156 (H) 65 - 99 mg/dL   No results found.     Medical Problem List and Plan: 1.  Limitations with mobility, endurance, and self-care secondary to debility. 2.  DVT Prophylaxis/Anticoagulation: Pharmaceutical: Heparin 3. Pain Management: Continue hydrocodone and flexeril prn 4. Mood: LCSW to follow for evaluation and support.  5. Neuropsych: This patient is capable of making decisions on his own behalf. 6. Skin/Wound Care: routine pressure relief measures.  7. Fluids/Electrolytes/Nutrition: Monitor I/O. Renal/diabetic diet with 1200 cc FR/Day 8. T2DM with gastroparesis and peripheral neuropathy: Continue reglan bid for GI symptoms. Monitor BS ac/hs. On Lantus 15 units bid with SSI for elevated BS.  9. CAD/HTN: Monitor BP bid--on metoprolol,  10.ESRD: Monitor weight daily with strict I/O. Schedule HD at end of day to help with tolerance of activity. 11. Persistent SOB:  Continue nebulizers prn. Encourage IS.     Post Admission Physician Evaluation: 1. Preadmission assessment reviewed and changes made below. 2. Functional deficits secondary  to debility. 3. Patient is admitted to receive collaborative, interdisciplinary care between the physiatrist, rehab nursing staff, and therapy team. 4. Patient's level of medical complexity and substantial therapy needs in context of that medical necessity cannot be provided at a lesser intensity of care such as a SNF. 5. Patient has experienced substantial functional loss from his/her baseline which was documented above under the "Functional History" and "Functional Status" headings.  Judging by the patient's diagnosis, physical exam, and functional history, the patient has potential for functional progress which will result in measurable gains while on inpatient rehab.  These gains will be of substantial and practical use upon discharge  in facilitating mobility and self-care at the household level. 6. Physiatrist will provide 24 hour management of medical needs as well as oversight of the therapy plan/treatment and provide guidance as appropriate regarding the interaction of the two. 7. 24 hour rehab nursing will assist with safety, skin/wound care, disease management and patient education  and help integrate therapy concepts, techniques,education, etc. 8. PT will assess and treat for/with: Lower extremity strength, range of motion, stamina, balance, functional mobility, safety, adaptive techniques and equipment, wound care, coping skills, pain control, education. Goals are: Mod I. 9. OT will assess and treat for/with: ADL's, functional mobility, safety, upper extremity strength, adaptive techniques and equipment, wound mgt, ego support, and community reintegration.   Goals are: Mod I. Therapy may proceed with showering this patient. 10. Case Management and Social Worker will assess and treat for psychological  issues and  discharge planning. 11. Team conference will be held weekly to assess progress toward goals and to determine barriers to discharge. 12. Patient will receive at least 3 hours of therapy per day at least 5 days per week. 13. ELOS: 6-10 days.       14. Prognosis:  good  I have personally performed a face to face diagnostic evaluation, including, but not limited to relevant history and physical exam findings, of this patient and developed relevant assessment and plan.  Additionally, I have reviewed and concur with the physician assistant's documentation above.  Delice Lesch, MD, ABPMR Bary Leriche, PA-C 05/24/2017

## 2017-05-25 NOTE — Progress Notes (Signed)
Physical Therapy Treatment Patient Details Name: Russell [name] MRN: [id number] DOB: 03/17/1970 Today's Date: 05/25/2017    History of Present Illness 47 y.o. 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  Who presented with N, V, hyperglycemia, elevated K. Dx with Acute hypoxic respiratory failure, esophageal dysphagia, diabetic gastroparesis    PT Comments    Patient being discharged to CIR. Patient with improved endurance this session as demonstrated by increased ambulation distance. Main deficit remains poor dynamic balance, requiring up to moderate assistance to steady during ambulation. Patient uses wall railing intermittently.  Maintaining > 95% SpO2 on RA with mobility. Suspect patient will progress well with intensive rehabilitation in CIR.   Follow Up Recommendations  CIR     Equipment Recommendations  None recommended by PT    Recommendations for Other Services       Precautions / Restrictions Precautions Precautions: Fall Restrictions Weight Bearing Restrictions: No    Mobility  Bed Mobility Overal bed mobility: Modified Independent             General bed mobility comments: modified independent with supine to sit  Transfers Overall transfer level: Needs assistance Equipment used: None Transfers: Sit to/from Stand Sit to Stand: Min assist         General transfer comment: Patient unable to achieve standing on first attempt due to difficulty gaining forward momentum and posterior lean with transition. Able to achieve standing on 2nd attempt utilizing wide BOS.   Ambulation/Gait Ambulation/Gait assistance: Min assist;Mod assist Ambulation Distance (Feet): 100 Feet Assistive device: None(wall railing) Gait Pattern/deviations: Step-through pattern;Wide base of support;Ataxic Gait velocity: slowed   General Gait Details: Patient requiring  min assist throughout gait for balance and up to moderate assistance for steadying during turns. Utilizes wall railing intermittently and requires multiple standing breaks. Maintaining > 90% SpO2 throughout ambulation on RA. VC's for stopping when regaining balance.     Stairs             Wheelchair Mobility    Modified Rankin (Stroke Patients Only)       Balance Overall balance assessment: Needs assistance Sitting-balance support: No upper extremity supported;Feet supported Sitting balance-Leahy Scale: Good     Standing balance support: No upper extremity supported;During functional activity Standing balance-Leahy Scale: Fair Standing balance comment: Able to statically stand with supervision but poor dynamic balance                            Cognition Arousal/Alertness: Awake/alert Behavior During Therapy: WFL for tasks assessed/performed Overall Cognitive Status: Within Functional Limits for tasks assessed                                        Exercises      General Comments        Pertinent Vitals/Pain Pain Assessment: Faces Faces Pain Scale: Hurts little more Pain Location: feet Pain Descriptors / Indicators: Burning;Tingling Pain Intervention(s): Monitored during session    Home Living                      Prior Function            PT Goals (current goals can now be found in the care plan section) Acute Rehab PT Goals Patient Stated  Goal: to feel stronger, no dizzy anymore PT Goal Formulation: With patient Time For Goal Achievement: 06/05/17 Potential to Achieve Goals: Good Progress towards PT goals: Progressing toward goals    Frequency    Min 3X/week      PT Plan Current plan remains appropriate    Co-evaluation              AM-PAC PT "6 Clicks" Daily Activity  Outcome Measure  Difficulty turning over in bed (including adjusting bedclothes, sheets and blankets)?: None Difficulty moving  from lying on back to sitting on the side of the bed? : None Difficulty sitting down on and standing up from a chair with arms (e.g., wheelchair, bedside commode, etc,.)?: A Lot Help needed moving to and from a bed to chair (including a wheelchair)?: A Little Help needed walking in hospital room?: A Little Help needed climbing 3-5 steps with a railing? : Total 6 Click Score: 17    End of Session Equipment Utilized During Treatment: Gait belt Activity Tolerance: Patient tolerated treatment well Patient left: in bed;with call bell/phone within reach Nurse Communication: Mobility status PT Visit Diagnosis: Unsteadiness on feet (R26.81);Other abnormalities of gait and mobility (R26.89);History of falling (Z91.81);Muscle weakness (generalized) (M62.81);Difficulty in walking, not elsewhere classified (R26.2);Ataxic gait (R26.0);Other symptoms and signs involving the nervous system (R29.898);Pain     Time: 4098-1191 PT Time Calculation (min) (ACUTE ONLY): 13 min  Charges:  $Gait Training: 8-22 mins                    G Codes:       Laurina Bustle, PT, DPT Acute Rehabilitation Services  Pager: [phone]    Vanetta Mulders 05/25/2017, 4:29 PM

## 2017-05-25 NOTE — Progress Notes (Signed)
Inpatient Rehabilitation-Admissions Coordinator   Spoke with pt at the bedside and provided him with letter of benefits. Pt still in agreement to come to CIR. AC received medical approval and bed available for admit. Will admit pt today to CIR. SW, CM, and nursing notified. Call if questions.   Nanine Means, OTR/L  Rehab Admissions Coordinator  [phone] 05/25/2017 3:03 PM

## 2017-05-25 NOTE — IPOC Note (Signed)
Overall Plan of Care [facility]) Patient Details Name: KENDREW PACI MRN: [id number] DOB: [birth year]  Admitting Diagnosis: Debility  Hospital Problems: Active Problems:   Debility     Functional Problem List: Nursing Behavior, Bladder, Edema, Endurance, Medication Management, Skin Integrity, Pain, Perception, Safety  PT Balance, Safety, Pain, Motor, Endurance  OT Balance, Motor, Pain, Sensory, Endurance  SLP    TR         Basic ADL's: OT Grooming, Bathing, Dressing, Toileting     Advanced  ADL's: OT Simple Meal Preparation     Transfers: PT Bed Mobility, Bed to Chair, Car, Lobbyist, Technical brewer: PT Ambulation, Stairs     Additional Impairments: OT Fuctional Use of Upper Extremity  SLP        TR      Anticipated Outcomes Item Anticipated Outcome  Self Feeding modified independent  Swallowing      Basic self-care  modified independent  Toileting  modified independent   Bathroom Transfers modified independent  Bowel/Bladder  supervision  Transfers  mod I  Locomotion  mod I in home, supervision in community   Communication     Cognition     Pain  less<2  Safety/Judgment  supervision   Therapy Plan: PT Intensity: Minimum of 1-2 x/day ,45 to 90 minutes PT Frequency: 5 out of 7 days PT Duration Estimated Length of Stay: 6-9 days OT Intensity: Minimum of 1-2 x/day, 45 to 90 minutes OT Frequency: 5 out of 7 days OT Duration/Estimated Length of Stay: 5- 7 days      Team Interventions: Nursing Interventions Patient/Family Education, Bladder Management, Pain Management, Skin Care/Wound Management, Disease Management/Prevention, Medication Management, Cognitive Remediation/Compensation, Discharge Planning, Psychosocial Support  PT interventions Ambulation/gait training, Warden/ranger, Community reintegration, Fish farm manager, Disease management/prevention, Discharge planning, Functional mobility  training, Neuromuscular re-education, Pain management, Patient/family education, Stair training, Psychosocial support, Therapeutic Activities, Therapeutic Exercise, UE/LE Strength taining/ROM, UE/LE Coordination activities  OT Interventions Balance/vestibular training, Discharge planning, Pain management, Self Care/advanced ADL retraining, Therapeutic Activities, UE/LE Coordination activities, Therapeutic Exercise, Patient/family education, Functional mobility training, Community reintegration, Fish farm manager, Neuromuscular re-education, UE/LE Strength taining/ROM  SLP Interventions    TR Interventions    SW/CM Interventions     Barriers to Discharge MD  Medical stability and New oxygen  Nursing      PT      OT      SLP      SW       Team Discharge Planning: Destination: PT-Home ,OT- Home , SLP-  Projected Follow-up: PT-Home health PT, OT-  Home health OT, SLP-  Projected Equipment Needs: PT-To be determined, OT- To be determined, SLP-  Equipment Details: PT-may need rollator/RW at d/c, OT-  Patient/family involved in discharge planning: PT- Patient,  OT-Patient, SLP-   MD ELOS: 5-7 days. 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 Last dialysis on Friday before admission. He was noted to have pulmonary edema with hyperkalemia and hyperglycemia. He was treated with IV calcium gluconate and HD.  He continues to report SOB with request for oxygen requiring oxygen as well as non-specific chest discomfort. N/V has resolved and po intake good. He was noted to be deconditioned and CIR recommended due to functional deficits. Will  set goals for Mod I with PT/OT.  See Team Conference Notes for weekly updates to the plan of care

## 2017-05-25 NOTE — Procedures (Signed)
Patient was seen on dialysis and the procedure was supervised.  BFR 400  Via AVF BP is  Low. Need to lower UF goal. He is asymptomatic. Continue to monitor.    Patient appears to be tolerating treatment well  Rochel Privett Jaynie Collins 05/25/2017

## 2017-05-25 NOTE — Care Management Note (Addendum)
Case Management Note  Patient Details  Name: Russell [name] MRN: [id number] Date of Birth: 28-Feb-1970  NCM spoke with Nanine Means, OTR/L Rehab Admissions Coordinator [phone], bed available, attending notified; Patient to discharge to CIR on 05/25/2017  Expected Discharge Date:  05/25/2017           Expected Discharge Plan:  IP Rehab Facility  In-House Referral:  [facility]  Discharge planning Services  CM Consult  Post Acute Care Choice: IP Rehab Choice offered to:  Patient  HH Arranged:  PT, OT HH Agency:  [facility]  Status of Service:  In process, will continue to follow  Yancey Flemings, RN 05/25/2017, 1:15 PM

## 2017-05-25 NOTE — Progress Notes (Signed)
PMR Admission Coordinator Pre-Admission Assessment  Patient: Russell [name] is an 47 y.o., male MRN: [id number] DOB: 1970-11-08 Height: 6' 2"  (188 cm) Weight: 115.1 kg (253 lb 12 oz)                                                                                                                                                  Insurance Information HMO:     PPO:      PCP:      IPA:      80/20: Yes    OTHER:  PRIMARY: Medicare Part A and B      Policy#: [id number]      Subscriber: Patient Benefits:  Phone #: Checked via [employer] online     Name:  Eff. Date: 04/03/15 (Both A and B)     Deduct: 1364      Out of Pocket Max: NA      Life Max: NA CIR: Covered once deductible met, per Medicare Guidelines      SNF: 80% coverage with 20% Co-pay Outpatient: Covered 80%     Co-Pay: 20% Home Health: Covered 80%      Co-Pay: 20% DME: Covered 80%     Co-Pay: 20% Providers: Pt's Choice  SECONDARY: Production manager [employer]-Medicare Supplement Plan C)      Policy#: [id number]      Subscriber: Patient Benefits:  Phone #: [phone]     Name:   Medicaid Application Date:       Case Manager:  Disability Application Date:       Case Worker:   Emergency Contact Information         Contact Information    Name Relation Home Work Mobile   Blow,Patsy Mother [phone]  [phone]   ford,nadga Significant other   [phone]     Current Medical History  Patient Admitting Diagnosis: Debility History of Present Illness: Russell [name] a 47 y.o.malewith history of ESRD- HD MWF, CAD, T2DM--poorly controlled with gastroparesis and peripheral neuropathy, CIDP- monthly IVIG, gait disorderandasthma; who was admitted on 05/20/17 with N/V, SOB and left sided chest pain.History taken from chart review and patient.Last dialysis session on Friday before and he was found to have pulmonary edema, hyperkalemia and hyperglycemia. He was treated with IV calcium  gluconate and HD with improvement in respiratory symptoms. He continues to have issues with abdominal discomfort and nausea as well as concerns about need for multiple medications. Therapy recommending CIR due to functional deficits. Pt to be admitted to CIR on 05/25/17.  Past Medical History      Past Medical History:  Diagnosis Date  . Anxiety   . Asthma   . CIDP (chronic inflammatory demyelinating polyneuropathy) ([place]) 01/10/2017  . Coronary artery disease involving native coronary artery of native heart with unstable angina pectoris ([place])    80%  LAD-95% oD1 bifurcation lesion & 90% RI --> referred for CABG  . Daily headache   . Depression   . DVT (deep venous thrombosis), H/o 01/2014-on Xarelto 03/24/2014   LLE  . ESRD (end stage renal disease) on dialysis [facility])    "[place], MWF" (06/29/2016)  . Gait abnormality 12/22/2016  . Gastroparesis 12/22/2016  . Hypertension   . Hypothyroidism   . Nephrotic syndrome 05/18/2014  . Neuropathy   . Type 2 diabetes mellitus with diabetic nephropathy (HCC)     Family History  family history includes Cancer (age of onset: 35) in his father; Heart disease in his unknown relative; Kidney cancer in his maternal grandmother; Obesity in his mother.  Prior Rehab/Hospitalizations:  Has the patient had major surgery during 100 days prior to admission? Yes; he reproted having a cardiac cath procedure in March that required anesthesia.  Current Medications   Current Facility-Administered Medications:  .  albuterol (PROVENTIL) (2.5 MG/3ML) 0.083% nebulizer solution 2.5 mg, 2.5 mg, Nebulization, Q6H PRN, Velna Ochs, MD .  aspirin chewable tablet 81 mg, 81 mg, Oral, Daily, Velna Ochs, MD, 81 mg at 05/24/17 0916 .  atorvastatin (LIPITOR) tablet 80 mg, 80 mg, Oral, q1800, Velna Ochs, MD, 80 mg at 05/23/17 1838 .  calcitRIOL (ROCALTROL) capsule 1.25 mcg, 1.25 mcg, Oral, Q M,W,F-HD, Ernest Haber, PA-C, 1.25 mcg at  05/23/17 1103 .  calcium acetate (PHOSLO) capsule 2,001 mg, 2,001 mg, Oral, TID WC, Zeyfang, David, PA-C, 2,001 mg at 05/24/17 1216 .  cinacalcet (SENSIPAR) tablet 30 mg, 30 mg, Oral, Q M,W,F, Guilloud, Carolyn, MD .  cyclobenzaprine (FLEXERIL) tablet 10 mg, 10 mg, Oral, TID PRN, Velna Ochs, MD, 10 mg at 05/23/17 2118 .  Darbepoetin Alfa (ARANESP) injection 100 mcg, 100 mcg, Intravenous, Q Wed-HD, Ernest Haber, PA-C, 100 mcg at 05/23/17 1102 .  DULoxetine (CYMBALTA) DR capsule 30 mg, 30 mg, Oral, Daily, Velna Ochs, MD, 30 mg at 05/24/17 0916 .  fluticasone (FLONASE) 50 MCG/ACT nasal spray 1 spray, 1 spray, Each Nare, Daily, Velna Ochs, MD, 1 spray at 05/24/17 0919 .  gabapentin (NEURONTIN) capsule 200 mg, 200 mg, Oral, QHS, Colbert Ewing, MD, 200 mg at 05/23/17 2119 .  heparin injection 5,000 Units, 5,000 Units, Subcutaneous, Q8H, Guilloud, Hoyle Sauer, MD .  HYDROcodone-acetaminophen (NORCO/VICODIN) 5-325 MG per tablet 1-2 tablet, 1-2 tablet, Oral, Q4H PRN, Velna Ochs, MD, 2 tablet at 05/24/17 0916 .  hydrocortisone cream 1 %, , Topical, TID PRN, Colbert Ewing, MD .  insulin aspart (novoLOG) injection 0-15 Units, 0-15 Units, Subcutaneous, TID WC, Velna Ochs, MD, 3 Units at 05/24/17 1216 .  insulin glargine (LANTUS) injection 15 Units, 15 Units, Subcutaneous, BID, Velna Ochs, MD, 15 Units at 05/24/17 [id number] .  levothyroxine (SYNTHROID, LEVOTHROID) tablet 50 mcg, 50 mcg, Oral, QAC breakfast, Velna Ochs, MD, 50 mcg at 05/24/17 0825 .  metoCLOPramide (REGLAN) tablet 5 mg, 5 mg, Oral, q12n4p, Velna Ochs, MD, 5 mg at 05/24/17 1216 .  metoprolol succinate (TOPROL-XL) 24 hr tablet 37.5 mg, 37.5 mg, Oral, BID, Velna Ochs, MD, 37.5 mg at 05/24/17 0916 .  ondansetron (ZOFRAN) injection 4 mg, 4 mg, Intravenous, Q8H PRN, Velna Ochs, MD, 4 mg at 05/20/17 1859 .  pantoprazole (PROTONIX) EC tablet 40 mg, 40 mg, Oral, BID AC, Colbert Ewing, MD,  40 mg at 05/24/17 0825 .  senna-docusate (Senokot-S) tablet 1 tablet, 1 tablet, Oral, QHS PRN, Velna Ochs, MD, 1 tablet at 05/23/17 2119  Patients Current Diet:       Diet Order  Diet renal/carb modified with fluid restriction Diet-HS Snack? Nothing; Fluid restriction: 1200 mL Fluid; Room service appropriate? Yes; Fluid consistency: Thin  Diet effective now          Precautions / Restrictions Precautions Precautions: Fall Restrictions Weight Bearing Restrictions: No   Has the patient had 2 or more falls or a fall with injury in the past year?Yes; 3 falls within the year with 1 resulting in knee abrasion.   Prior Activity Level Community (5-7x/wk): Pt was on permanent disability but go out of the house daily and performed chores daily; he drove PTA  [place] Devices/Equipment: None Home Equipment: Cane - single point, Bedside commode  Prior Device Use: Indicate devices/aids used by the patient prior to current illness, exacerbation or injury? None of the above; Used a single point cane intermittently.   Prior Functional Level Prior Function Level of Independence: Independent with assistive device(s) Comments: SPC recently with neuropathy  Self Care: Did the patient need help bathing, dressing, using the toilet or eating?  Independent  Indoor Mobility: Did the patient need assistance with walking from room to room (with or without device)? Independent  Stairs: Did the patient need assistance with internal or external stairs (with or without device)? Needed some help; pt reports he would avoid them  Functional Cognition: Did the patient need help planning regular tasks such as shopping or remembering to take medications? Independent  Current Functional Level Cognition  Overall Cognitive Status: Within Functional Limits for tasks assessed Orientation Level: Oriented X4    Extremity  Assessment (includes Sensation/Coordination)  Upper Extremity Assessment: Generalized weakness RUE Sensation: decreased light touch RUE Coordination: decreased fine motor LUE Sensation: decreased light touch LUE Coordination: decreased fine motor  Lower Extremity Assessment: Generalized weakness(hips 4+ add, 4- add, knee ext 4- R and 3 to 3+ otherwise) RLE Deficits / Details: no sensation below mid calf, ROM of hip and knee grossly WFL, limited ankle dorsiflexion, strength of hip and knee grossly 4/5 with MMT causes pain RLE: Unable to fully assess due to pain RLE Sensation: decreased light touch, history of peripheral neuropathy, decreased proprioception(CIDP) RLE Coordination: decreased gross motor LLE Deficits / Details: no sensation below mid calf, ROM of hip and knee grossly WFL, limited ankle dorsiflexion, strength of hip and knee grossly 3+/5 with MMT causes pain LLE: Unable to fully assess due to pain LLE Sensation: decreased light touch, history of peripheral neuropathy, decreased proprioception(CIDP) LLE Coordination: decreased gross motor    ADLs  Overall ADL's : Needs assistance/impaired Eating/Feeding: Minimal assistance, Sitting Grooming: Minimal assistance, Standing, Wash/dry hands, Wash/dry face Upper Body Bathing: Sitting, Min guard Upper Body Bathing Details (indicate cue type and reason): simulated Lower Body Bathing: Moderate assistance, Sit to/from stand, Cueing for sequencing, Cueing for safety Upper Body Dressing : Minimal assistance, Standing Lower Body Dressing: Sit to/from stand, Cueing for sequencing, Cueing for safety, Moderate assistance, Sitting/lateral leans Toilet Transfer: Comfort height toilet, RW, Minimal assistance, Grab bars, Ambulation Toilet Transfer Details (indicate cue type and reason): RW used to bathroom, HHA on way back from bathroom Toileting- Clothing Manipulation and Hygiene: Sit to/from stand, Minimal assistance Functional mobility  during ADLs: Moderate assistance, Minimal assistance    Mobility  Overal bed mobility: Modified Independent General bed mobility comments: used rails    Transfers  Overall transfer level: Needs assistance Equipment used: Rolling walker (2 wheeled) Transfers: Sit to/from Stand Sit to Stand: Mod assist, Min assist General transfer comment: bed elevated, mod A fro  bed, min A for toilet using grab bar    Ambulation / Gait / Stairs / Wheelchair Mobility  Ambulation/Gait Ambulation/Gait assistance: Museum/gallery curator (Feet): 50 Feet Assistive device: None(use of handrails in hallway) Gait Pattern/deviations: Step-through pattern, Ataxic, Staggering left, Staggering right, Wide base of support, Decreased dorsiflexion - right, Decreased dorsiflexion - left General Gait Details: unable to step Gait velocity: slowed Gait velocity interpretation: <1.8 ft/sec, indicate of risk for recurrent falls    Posture / Balance Balance Overall balance assessment: Needs assistance Sitting-balance support: No upper extremity supported, Feet supported Sitting balance-Leahy Scale: Good Standing balance support: No upper extremity supported, During functional activity Standing balance-Leahy Scale: Poor Standing balance comment: able to static stand wound require support for dynamic balance    Special needs/care consideration BiPAP/CPAP:No CPM: No Continuous Drip IV: No Dialysis: Yes, Mon/Wed/Friday       Life Vest: No Oxygen: currently on [place] in room; new to this admission 2/2 SOB Special Bed: No Trach Size: No Wound Vac (area): No      Location:No Skin:Pt reports having an itchy back currently; abrasion to L knee; Fistula to L forearm                    Bowel mgmt: 05/24/17; continent  Bladder mgmt: Does not make urine consistently; Continent when able to produce urine Diabetic mgmt: Yes, use of injections.      Previous Home Environment Living Arrangements:  Parent Available Help at Discharge: Family, Available 24 hours/day Type of Home: House Home Layout: One level Home Access: Stairs to enter Entrance Stairs-Rails: None Entrance Stairs-Number of Steps: 1 Bathroom Shower/Tub: Optometrist: Yes Home Care Services: No Additional Comments: log cabin,   Discharge Living Setting Plans for Discharge Living Setting: House Type of Home at Discharge: House(log cabin) Discharge Home Layout: One level Discharge Home Access: Stairs to enter Entrance Stairs-Rails: None Entrance Stairs-Number of Steps: 1 Discharge Bathroom Shower/Tub: Tub/shower unit Discharge Bathroom Toilet: Standard(but has access to [facility]) Discharge Bathroom Accessibility: No Does the patient have any problems obtaining your medications?: No  Social/Family/Support Systems Patient Roles: Partner Anticipated Caregiver: (Mother (Patsy), Dad Richardson Landry), girlfriend Bartholomew Boards)) Anticipated Caregiver's Contact Information: (Mom: [phone]; Dad: [phone]) Ability/Limitations of Caregiver: (Parents are able to physically assist per pt report) Caregiver Availability: 24/7 Discharge Plan Discussed with Primary Caregiver: Yes Is Caregiver In Agreement with Plan?: Yes Does Caregiver/Family have Issues with Lodging/Transportation while Pt is in Rehab?: No   Goals/Additional Needs Patient/Family Goal for Rehab: PT: Sup/Min A; OT: Sup/Min A; SLP: NA Expected length of stay: 15-19 days Cultural Considerations: NA Dietary Needs: Renal/Card modified diet with fluid restrction of 1200 mL fluid; thin liquids Equipment Needs: TBD Special Service Needs: HD on MWF Additional Information: Pt would like to know if his GI consult could be completed in house; he requests follow up with Dr. Sherlynn Stalls for prior eye surgery appox 2 weeks ago Pt/Family Agrees to Admission and willing to participate: Yes Program Orientation Provided &  Reviewed with Pt/Caregiver Including Roles  & Responsibilities: Yes(pt) Additional Information Needs: Pt would like to have information regarding medication management; services to help manage large amounts of pills Information Needs to be Provided By: Nursing vs SW  Barriers to Discharge: Home environment access/layout, New oxygen   Decrease burden of Care through IP rehab admission: NA   Possible need for SNF placement upon discharge:Not anticipated   Patient Condition: This patient's medical and functional status has changed  since the consult dated: 05/23/17 in which the Rehabilitation Physician determined and documented that the patient's condition is appropriate for intensive rehabilitative care in an inpatient rehabilitation facility. See "History of Present Illness" (above) for medical update. Functional changes are: Mod A sit to stand and Min A 50 feet ambulation. Patient's medical and functional status update has been discussed with the Rehabilitation physician and patient remains appropriate for inpatient rehabilitation. Will admit to inpatient rehab today.  Preadmission Screen Completed By:  Jhonnie Garner, 05/24/2017 1:42 PM ______________________________________________________________________   Discussed status with Dr. Posey Pronto on 5/24/19at 14:43 and received telephone approval for admission today.  Admission Coordinator:  Jhonnie Garner, time 14:43/Date 05/25/17             Cosigned by: Jamse Arn, MD at 05/25/2017 3:19 PM  Revision History

## 2017-05-25 NOTE — Progress Notes (Signed)
Patient is going to be transferred via wheelchair to 4W to inpatient rehab. I gave report to RN on 4W about patient. IV has been taken out, and patient belongings are with patient.   Jaynee Eagles, RN

## 2017-05-25 NOTE — Progress Notes (Signed)
   Subjective:  Russell [name] reports improvement in his breathing. He continues to endorse mild abdominal soreness and nausea, however able to tolerate small amounts of PO intake and denies emesis. He was able to walk in the hallway yesterday, felt weak and had some trouble breathing.  Objective:  Vital signs in last 24 hours: Vitals:   05/24/17 0831 05/24/17 1538 05/24/17 2235 05/25/17 0547  BP: 128/84 117/89 124/80 102/79  Pulse: 79 78 79 73  Resp: Temp: 97.9 F (36.6 C) 97.9 F (36.6 C) 97.8 F (36.6 C) 98 F (36.7 C)  TempSrc: Oral  Oral Oral  SpO2: 99% 100% 100% 100%  Weight:      Height:       GEN: Sitting in bed in NAD in HD CV: NR & RR, no m/r/g PULM: CTAB, no wheezes or rales ABD: normal BS, mild TTP, soft, obese MSK: Trace BLE edema  Assessment/Plan:  Active Problems:   ESRD (end stage renal disease) (HCC)   Nausea and vomiting   Pulmonary edema   Diabetes mellitus type 2 in obese (HCC)   Diabetic peripheral neuropathy (HCC)   Diabetic gastroparesis (HCC)   Hypothyroidism   Anemia of chronic disease   Acute blood loss anemia   Abnormal EKG   Dysphagia   Supplemental oxygen dependent  Russell [name] is a 47yo male with PMH of DM, gastroparesis, ESRD on HD MWF, CIDP, HFrEF (EF 25-30%), CAD s/p CABG, HTN, and hypothyroidism who is currently being managed for gastroparesis flare, as well as LE weakness secondary to deconditioning vs diabetic peripheral neuropathy. Plan to discharge to CIR when available.  LE weakness, Shortness of breath Most likely a combination of several etiologies, including deconditioning, diabetic peripheral neuropathy, and anemia. Less likely progression of CIDP. Currently saturating well on 2L [name], for comfort. Medically stable for discharge to CIR when available. - F/u with Neurology outpatient for continued management of CIDP - F/u with PCP for continued management of diabetes - Discharge to CIR when able  Diabetic  Gastroparesis Symptoms improved. Tolerating PO intake. - Small frequent meals - Metoclopramide renal dosing  BID - Norco q4h PRN for moderate pain  Esophageal dysphagia Recent imaging shows no evidence of an esophageal mass or stricture. Able to tolerate PO intake. - Outpatient f/u with GI - Continue PO pantoprazole  BID  CIDP - Norco q4h PRN for moderate pain - Cymbalta qAM and gabapentin  QHS for pain - Outpatient f/u with Neurology  HFrEF (TTE 03/2017: EF 25-30%) ESRD on HD MWF - Nephrology consulted; appreciate their assistance - Dialysis per Nephro - Continue home metoprolol 37.5mg  BID - Wean supplemental oxygen as tolerated  CAD s/p CABG in 2017 - Continue home aspirin  daily and atorvastatin  QHS  DM BG 85-155 - Lantus 15u BID - Novolog SSI-M q4h - CBG monitoring  Dispo: Anticipated discharge in approximately 0-1 day(s).   Russell Gerlach, MD 05/25/2017, 6:22 AM Pager: Demetrius Charity [phone]

## 2017-05-25 NOTE — Progress Notes (Signed)
Patient was informed about rehab process including patient safety plan and rehab booklet. 

## 2017-05-25 NOTE — Progress Notes (Signed)
Inpatient Rehabilitation-Admissions Coordinator   Called in-house dialysis unit and spoke to [place] to confirm that the patient will be admitted to CIR today (room unknown at this time).  Nanine Means, OTR/L  Rehab Admissions Coordinator  [phone] 05/25/2017 2:37 PM

## 2017-05-26 ENCOUNTER — Inpatient Hospital Stay (HOSPITAL_COMMUNITY): Payer: Medicare Other | Admitting: Occupational Therapy

## 2017-05-26 ENCOUNTER — Inpatient Hospital Stay (HOSPITAL_COMMUNITY): Payer: Medicare Other | Admitting: Physical Therapy

## 2017-05-26 DIAGNOSIS — E1142 Type 2 diabetes mellitus with diabetic polyneuropathy: Secondary | ICD-10-CM

## 2017-05-26 DIAGNOSIS — G6181 Chronic inflammatory demyelinating polyneuritis: Secondary | ICD-10-CM

## 2017-05-26 DIAGNOSIS — R5381 Other malaise: Principal | ICD-10-CM

## 2017-05-26 NOTE — Progress Notes (Signed)
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 rate 16, height _0  (1.88 m), weight 119.8 kg (264 lb 1.8 oz), SpO2 99 %. No results found. Results for orders placed or performed during the hospital encounter of 05/20/17 (from the past 72 hour(s))  Glucose, capillary     Status: Abnormal   Collection Time: 05/23/17  4:26 PM  Result Value Ref Range   Glucose-Capillary 187 (H) 65 - 99 mg/dL  Glucose, capillary     Status: Abnormal   Collection Time: 05/23/17  9:07 PM  Result Value Ref Range   Glucose-Capillary 105 (H) 65 - 99 mg/dL  Glucose, capillary     Status: Abnormal   Collection Time: 05/24/17  7:29 AM  Result Value Ref Range   Glucose-Capillary 156 (H) 65 - 99 mg/dL  Glucose, capillary     Status: Abnormal   Collection Time: 05/24/17 11:31 AM  Result Value Ref Range   Glucose-Capillary 153 (H) 65 - 99 mg/dL  Glucose, capillary     Status: Abnormal   Collection Time: 05/24/17  4:53 PM  Result Value Ref Range   Glucose-Capillary 128 (H) 65 - 99 mg/dL  Glucose, capillary     Status: Abnormal   Collection Time: 05/24/17 10:32 PM  Result Value Ref Range   Glucose-Capillary 155 (H) 65 - 99 mg/dL  Glucose, capillary     Status: None   Collection Time: 05/25/17  7:46 AM  Result Value Ref Range   Glucose-Capillary 85 65 - 99 mg/dL  CBC     Status: Abnormal   Collection Time: 05/25/17  8:36 AM  Result Value Ref Range   WBC 8.1 4.0 - 10.5 K/uL   RBC 3.08 (L) 4.22 - 5.81 MIL/uL   Hemoglobin 9.4 (L) 13.0 - 17.0 g/dL   HCT 29.1 (L) 39.0 - 52.0 %   MCV 94.5 78.0 - 100.0 fL   MCH 30.5 26.0 - 34.0 pg   MCHC 32.3 30.0 - 36.0 g/dL   RDW 15.2 11.5 - 15.5 %   Platelets 258 150 - 400 K/uL    Comment: Performed at [facility], [address], [place], [place]  [address]  Renal function panel     Status: Abnormal   Collection Time: 05/25/17  8:36 AM  Result Value Ref Range   Sodium 129 (L) 135 - 145 mmol/L   Potassium 5.0 3.5 - 5.1 mmol/L   Chloride 90 (L) 101 - 111 mmol/L   CO2 26 22 - 32 mmol/L   Glucose, Bld 92 65 - 99 mg/dL   BUN 50 (H) 6 - 20 mg/dL   Creatinine, Ser 9.18 (H) 0.61 - 1.24 mg/dL   Calcium 9.2 8.9 - 10.3 mg/dL   Phosphorus 6.8 (H) 2.5 - 4.6 mg/dL   Albumin 3.6 3.5 - 5.0 g/dL   GFR calc non Af Amer 6 (L) >60 mL/min   GFR calc Af Amer 7 (L) >60 mL/min    Comment: (NOTE) The eGFR has been calculated using the CKD EPI equation. This calculation has not been validated in all clinical situations. eGFR's persistently <60 mL/min signify possible Chronic Kidney Disease.    Anion gap 13 5 - 15    Comment: Performed at [facility] [address] [place], [place] [address]  Glucose, capillary     Status: Abnormal   Collection Time: 05/25/17  1:17 PM  Result Value Ref Range   Glucose-Capillary 111 (H) 65 - 99 mg/dL     HEENT: normal Cardio: RRR and no murmur Resp: CTA B/L and unlabored GI: BS positive and NT, ND Extremity:  No Edema Skin:   Intact Neuro: Alert/Oriented, Cranial Nerve II-XII normal, Abnormal Sensory absent LT sensation below the knees, Abnormal Motor 5/5 bilateral del bi tri, 3- bilateral hand intrinsic, and grip, 4/5 bilateral HF, KE, 3- Bilateral ankle DF, trace toe flex and Abnormal FMC Ataxic/ dec FMC Musc/Skel:  Other hand intrinsic minus deformities Gen NAD   Assessment/Plan: 1. Functional deficits secondary to Debility due to acute renal failure which require 3+ hours per day of interdisciplinary therapy in a comprehensive inpatient rehab setting. Physiatrist is providing close team supervision and 24 hour management of active medical problems listed below. Physiatrist and rehab team continue to assess barriers to discharge/monitor patient progress toward functional and medical goals. FIM:              Function - Chair/bed transfer Chair/bed transfer method: Stand pivot Chair/bed transfer assist level: Touching or steadying assistance (Pt > 75%) Chair/bed transfer details: Verbal cues for precautions/safety  Function - Locomotion: Wheelchair Will patient use wheelchair at discharge?: No Function - Locomotion: Ambulation Assistive device: No device Max distance: 150' Assist level: Touching or steadying assistance (Pt > 75%) Assist level: Touching or steadying assistance (Pt > 75%) Assist level: Touching or steadying assistance (Pt > 75%) Assist level: Touching or steadying assistance (Pt > 75%) Assist level: Touching or steadying assistance (Pt > 75%)  Function - Comprehension Comprehension: Auditory Comprehension assist level: Follows basic conversation/direction with extra time/assistive device  Function - Expression Expression: Verbal Expression assist level: Expresses basic needs/ideas: With no assist  Function - Social Interaction Social Interaction assist level: Interacts appropriately with others with medication or extra time (anti-anxiety, antidepressant).  Function - Problem Solving Problem solving assist level: Solves basic 90% of the time/requires cueing < 10% of the time  Function - Memory Memory assist level: Recognizes or recalls 90% of the time/requires cueing < 10% of the time Patient normally able to recall (first 3 days only): Current season, Location of own room, Staff names and faces, That he or she is in a hospital   Medical Problem List and Plan: 1.  Limitations with mobility, endurance, and self-care secondary to debility. Therapy evals today 2.  DVT Prophylaxis/Anticoagulation: Pharmaceutical: Heparin 3. Pain Management: Continue hydrocodone and flexeril prn 4. Mood: LCSW to follow for evaluation and support.  5. Neuropsych: This patient is capable of making decisions on his own behalf. 6. Skin/Wound Care: routine pressure relief  measures.  7. Fluids/Electrolytes/Nutrition: Monitor I/O. Renal/diabetic diet with 1200 cc FR/Day 8. T2DM with gastroparesis and peripheral neuropathy: Continue reglan bid for GI symptoms. Monitor BS ac/hs. On Lantus 15 units bid with SSI for elevated BS.  CBG (last 3)  Recent Labs    05/24/17 2232 05/25/17 0746 05/25/17 1317  GLUCAP 155* 85 111*  controlled  9. CAD/HTN: Monitor BP bid--on metoprolol,  10.ESRD:Nephrology following  Monitor weight daily with strict I/O. Schedule HD at end of day to help with tolerance of activity. 11. Persistent SOB: Continue nebulizers prn. Encourage IS.     LOS (Days) 1 A FACE TO FACE EVALUATION WAS PERFORMED  Charlett Blake 05/26/2017, 1:14 PM

## 2017-05-26 NOTE — Evaluation (Signed)
Occupational Therapy Assessment and Plan  Patient Details  Name: Russell [name] MRN: [id number] Date of Birth: [birth year]  OT Diagnosis: acute pain and muscle weakness (generalized) Rehab Potential: Rehab Potential (ACUTE ONLY): Good ELOS: 5- 7 days   Today's Date: 05/26/2017 OT Individual Time: 4098-1191 OT Individual Time Calculation (min): 85 min     Problem List:  Patient Active Problem List   Diagnosis Date Noted  . Debility 05/25/2017  . Diabetes mellitus type 2 in obese ([place])   . Diabetic peripheral neuropathy ([place])   . Diabetic gastroparesis ([place])   . Hypothyroidism   . Anemia of chronic disease   . Acute blood loss anemia   . Abnormal EKG   . Dysphagia   . Supplemental oxygen dependent   . Pulmonary edema 05/20/2017  . Chronic pain syndrome 04/23/2017  . Chronic peripheral neuropathic pain 04/23/2017  . DKA (diabetic ketoacidoses) ([place]) 03/19/2017  . CAP (community acquired pneumonia) 03/01/2017  . Anxiety 02/06/2017  . Asthma 02/06/2017  . 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 Hyperlipidemia 02/06/2017  . Macular degeneration 02/06/2017  . Neuropathy 02/06/2017  . Restrictive airway disease 02/06/2017  . CIDP (chronic inflammatory demyelinating polyneuropathy) ([place]) 01/10/2017  . Quadriparesis ([place]) 12/22/2016  . Gait abnormality 12/22/2016  . Hypertensive urgency 10/13/2016  . Acute pulmonary edema (HCC)   . Anemia in chronic kidney disease, on chronic dialysis ([place])   . Weakness   . Hyperkalemia 09/26/2016  . History of DVT of lower extremity 08/17/2016  . Arteriosclerotic vascular disease 08/17/2016  . DKA, type 1 ([place]) 06/29/2016  . Nausea vomiting and diarrhea 06/29/2016  . Malignant hypertension 06/29/2016  . Hypoxemic respiratory failure, chronic ([employer]) 05/16/2016  . Anemia due to end stage renal disease ([place]) 05/16/2016  . Pre-transplant evaluation for kidney transplant 04/25/2016  . S/P CABG (coronary artery bypass graft) 09/28/2015  . Coronary artery  disease involving native coronary artery of native heart without angina pectoris   . ESRD (end stage renal disease) ([place]) 02/13/2015  . Nausea and vomiting 02/13/2015  . Nephrotic syndrome 01/03/2015  . Essential hypertension, benign   . Diabetic ketoacidosis without coma associated with type 2 diabetes mellitus ([place])   . Hx of gastroesophageal reflux (GERD) 09/30/2014  . Diabetic neuropathy ([place]) 09/21/2014  . Uncontrolled diabetes mellitus type 2 with peripheral artery disease ([place]) 09/21/2014  . Morbid obesity ([place]) 09/21/2014  . Proteinuria 07/09/2014  . Chest pain 04/22/2014  . Superficial thrombophlebitis 03/24/2014  . Hypothyroidism (acquired) 03/24/2014    Past Medical History:  Past Medical History:  Diagnosis Date  . Anxiety   . Asthma   . 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 Daily headache   . Depression   . DVT (deep venous thrombosis), H/o 01/2014-on Xarelto 03/24/2014   LLE  . ESRD (end stage renal disease) on dialysis [facility])    "[place], MWF" (06/29/2016)  . Gait abnormality 12/22/2016  . Gastroparesis 12/22/2016  . Hypertension   . Hypothyroidism   . Nephrotic syndrome 05/18/2014  . Neuropathy   . Type 2 diabetes mellitus with diabetic nephropathy [facility])    Past Surgical History:  Past Surgical History:  Procedure Laterality Date  . ANKLE FRACTURE SURGERY Right 1988  . AV FISTULA PLACEMENT Left 01/01/2015   Procedure: CREATION OF LEFT RADIAL CEPHALIC ARTERIOVENOUS (AV) FISTULA ;  Surgeon: Mal Misty, MD;  Location: [facility];  Service: Vascular;  Laterality: Left;  . CAPD REMOVAL N/A 05/19/2016   Procedure: PD CATH REMOVAL;  Surgeon: Coralie Keens, MD;  Location: [facility];  Service: General;  Laterality: N/A;  . CARDIAC CATHETERIZATION N/A  09/22/2015   Procedure: Left Heart Cath and Coronary Angiography;  Surgeon: Wellington Hampshire, MD;  Location: [name];  Service: Cardiovascular;  Laterality: N/A;  . CORONARY ARTERY BYPASS GRAFT N/A 09/28/2015   Procedure: CORONARY ARTERY BYPASS GRAFTING (CABG) x 3 UTILIZING LEFT MAMMARY ARTERY AND ENDOSCOPICALLY HARVESTED LEFT GREATER SAPHENOUS VEIN.;  Surgeon: [name], MD;  Location: [facility];  Service: Open Heart Surgery;  Laterality: N/A;  . ENDOVEIN HARVEST OF GREATER SAPHENOUS VEIN Left 09/28/2015   Procedure: ENDOVEIN HARVEST OF GREATER SAPHENOUS VEIN;  Surgeon: [name], MD;  Location: [facility];  Service: Open Heart Surgery;  Laterality: Left;  . ESOPHAGOGASTRODUODENOSCOPY N/A 03/29/2014   Procedure: ESOPHAGOGASTRODUODENOSCOPY (EGD);  Surgeon: Teena Irani, MD;  Location: [facility];  Service: Endoscopy;  Laterality: N/A;  . EYE SURGERY    . FRACTURE SURGERY    . INSERTION OF DIALYSIS CATHETER Right 01/05/2015   Procedure: INSERTION OF RIGHT INTERNAL JUGULAR DIALYSIS CATHETER;  Surgeon: Conrad La[place], MD;  Location: [facility];  Service: Vascular;  Laterality: Right;  . LEFT HEART CATH AND CORS/GRAFTS ANGIOGRAPHY N/A 09/13/2016   Procedure: LEFT HEART CATH AND CORS/GRAFTS ANGIOGRAPHY;  Surgeon: Burnell Blanks, MD;  Location: [facility];  Service: Cardiovascular;  Laterality: N/A;  . RETINAL LASER PROCEDURE Bilateral   . RIGHT/LEFT HEART CATH AND CORONARY/GRAFT ANGIOGRAPHY N/A 03/06/2017   Procedure: RIGHT/LEFT HEART CATH AND CORONARY/GRAFT ANGIOGRAPHY;  Surgeon: Leonie Man, MD;  Location: [facility];  Service: Cardiovascular;  Laterality: N/A;  . TEE WITHOUT CARDIOVERSION N/A 09/28/2015   Procedure: TRANSESOPHAGEAL ECHOCARDIOGRAM (TEE);  Surgeon: [name], MD;  Location: [facility];  Service: Open Heart Surgery;  Laterality: N/A;  . TONSILLECTOMY AND ADENOIDECTOMY  1970s    Assessment & Plan Clinical Impression: Patient is a 47 y.o. year old  male with recent admission to the hospital on 05/20/17 with reports of SOB, N/V and left sided chest pain. History taken from chart review and patient. Last dialysis on Friday before admission. He was noted to have pulmonary edema with hyperkalemia and hyperglycemia. He was treated with IV calcium gluconate and HD.  He continues to report SOB with request for oxygen requiring oxygen as well as non-specific chest discomfort.  Patient transferred to CIR on 05/25/2017 .    Patient currently requires min with basic self-care skills secondary to muscle weakness, impaired timing and sequencing, unbalanced muscle activation and decreased coordination and decreased standing balance and decreased balance strategies.  Prior to hospitalization, patient could complete ADLs with modified independent .  Patient will benefit from skilled intervention to decrease level of assist with basic self-care skills, increase independence with basic self-care skills and increase level of independence with iADL prior to discharge home with care partner.  Anticipate patient will require intermittent supervision and follow up home health.  OT - End of Session Activity Tolerance: Tolerates 30+ min activity with multiple rests Endurance Deficit: Yes OT Assessment Rehab Potential (ACUTE ONLY): Good OT Patient demonstrates impairments in the following area(s): Balance;Motor;Pain;Sensory;Endurance OT Basic ADL's Functional Problem(s): Grooming;Bathing;Dressing;Toileting OT Advanced ADL's Functional Problem(s): Simple Meal Preparation OT Transfers Functional Problem(s): Tub/Shower;Toilet OT Additional Impairment(s): Fuctional Use of Upper Extremity OT Plan  OT Intensity: Minimum of 1-2 x/day, 45 to 90 minutes OT Frequency: 5 out of 7 days OT Duration/Estimated Length of Stay: 5- 7 days OT Treatment/Interventions: Balance/vestibular training;Discharge planning;Pain management;Self Care/advanced ADL retraining;Therapeutic  Activities;UE/LE Coordination activities;Therapeutic Exercise;Patient/family education;Functional mobility training;Community reintegration;DME/adaptive equipment instruction;Neuromuscular re-education;UE/LE Strength taining/ROM OT Self Feeding Anticipated Outcome(s): modified independent OT Basic Self-Care Anticipated Outcome(s): modified independent OT Toileting Anticipated Outcome(s): modified independent OT Bathroom Transfers Anticipated Outcome(s): modified independent OT Recommendation Patient destination: Home Follow Up Recommendations: Home health OT Equipment Recommended: To be determined   Skilled Therapeutic Intervention Pt began working on selfcare retraining sit to stand during session.  Min guard assist for ambulation to the walk-in shower and for sitting down on the shower bench.  He was able to remove all clothing with supervision and then complete shower sit to stand at min guard assist.  He needed assist with opening soap for functional use.  Dressing was completed in standing per his request with use of the grab bar and the wall for support.  Min guard assist for donning shorts before walking out to the sink with min guard assist.  He was able to brush his teeth but needed assist with opening toothpaste cap.  LB dressing completed sitting EOB for pt to donn slip on croc style shoes and to [employer] shirt.  He demonstrated attempt at donning gripper sock but could only get it half way secondary to decreased FM strength.  Next had pt ambulate down to the therapy gym with the rollator and close supervision.  Once in the gym further looked at UE strength and worked on [employer] coordination and strength with use of the resistive clothespins.  Pt only attempted the yellow ones which he could complete with the RUE and supervision, but still demonstrated decreased tip to tip pinch.  He needed min assist for completing them with the left.  Next issued light resistance therapy putty and educated  pt on completion of gross grasp and tip to tip pinching exercises.  Also educated him on thumb flexion.  He does exhibit difficulty with them palmar abduction during isolated use and with gross grasp.  Finished session with return to the room and pt resting in bed with bed alarm in place and call button and phone in reach.    OT Evaluation Precautions/Restrictions  Precautions Precautions: Fall Precaution Comments: impulsive  Pain Pain Assessment Pain Scale: 0-10 Pain Score: 4  Pain Type: Chronic pain Pain Location: Foot Pain Orientation: Left;Right Pain Descriptors / Indicators: Burning Pain Intervention(s): Medication (See eMAR) Home Living/Prior Functioning Home Living Available Help at Discharge: Family, Available 24 hours/day Type of Home: House Home Access: Stairs to enter Technical brewer of Steps: 1 Entrance Stairs-Rails: None Home Layout: One level Bathroom Shower/Tub: Optometrist: Yes Additional Comments: log cabin,   Lives With: Other (Comment) IADL History Current License: Yes Mode of Transportation: Car Occupation: On disability Prior Function Level of Independence: Independent with gait, Independent with transfers(Pt reports walking and holding onto objects throughout the house)  Able to Take Stairs?: Yes Driving: Yes Vocation: On disability ADL  See Function Section of chart for details  Vision Baseline Vision/History: Wears glasses Wears Glasses: At all times Patient Visual Report: No change from baseline(Reports having recent surgery to evacuate hemmoraged blood from eyes which improved his vision.  ) Perception  Perception: Within Functional Limits Praxis Praxis: Intact Cognition Overall Cognitive Status: No family/caregiver present to determine baseline cognitive functioning Arousal/Alertness: Awake/alert Orientation Level:  Person;Place;Situation Person: Oriented Place:  Oriented Situation: Oriented Year: 2019 Month: May Day of Week: Correct Memory: Appears intact Immediate Memory Recall: Sock;Blue;Bed Memory Recall: Bed;Blue;Sock Memory Recall Sock: Without Cue Memory Recall Blue: Without Cue Memory Recall Bed: Without Cue Attention: Sustained;Selective Sustained Attention: Appears intact Selective Attention: Appears intact Awareness: Impaired Awareness Impairment: Anticipatory impairment Safety/Judgment: Impaired Sensation Sensation Light Touch: Impaired Detail Light Touch Impaired Details: Impaired RUE;Impaired LUE Stereognosis: Impaired Detail Stereognosis Impaired Details: Impaired RUE;Impaired LUE Additional Comments: Pt able to detect light touch in bilateral hands but impaired compared to normal.  He was able to identify 2/3 medium sized items place in hands. Coordination Gross Motor Movements are Fluid and Coordinated: No Fine Motor Movements are Fluid and Coordinated: No Coordination and Movement Description: Pt with impaired BUE coordination with noted loss of thenar musculature as well as transfer arch.  He can use both UEs functionally but demonstrates decreased FM control for opening caps and bottles as well as donning socks.   Motor  Motor Motor: Ataxia Motor - Skilled Clinical Observations: Mild coordination and motor impairment in all extremities. Mobility    See Function Section of chart for details  Trunk/Postural Assessment  Cervical Assessment Cervical Assessment: Within Functional Limits Thoracic Assessment Thoracic Assessment: Exceptions to WFL(slight thoracic rounding) Lumbar Assessment Lumbar Assessment: Within Functional Limits Postural Control Postural Control: Within Functional Limits  Balance Balance Balance Assessed: Yes Static Sitting Balance Static Sitting - Balance Support: Feet supported Static Sitting - Level of Assistance: 6: Modified independent (Device/Increase time) Dynamic Sitting  Balance Dynamic Sitting - Balance Support: During functional activity Dynamic Sitting - Level of Assistance: 5: Stand by assistance Static Standing Balance Static Standing - Balance Support: During functional activity Static Standing - Level of Assistance: 5: Stand by assistance Dynamic Standing Balance Dynamic Standing - Balance Support: During functional activity Dynamic Standing - Level of Assistance: 4: Min assist Extremity/Trunk Assessment RUE Assessment RUE Assessment: Exceptions to [facility] RUE Strength RUE Overall Strength Comments: Pt demonstrates AROM WFLS for shoulder, elbow, and wrist.  Strength overall 4/5 in the shoulders and elbows, with 3+/5 in the wrist flexors and extensors.  Gross grasp and release present but noted atrophy in the thenar eminence and transverse arches.  Grip strength 5 lbs.  Decreased ability to manipulate and open toothpaste or soap and could not sustain enough grip to pull up gripper socks once he started them over his feet.   LUE Assessment LUE Assessment: Exceptions to [facility] LUE Strength LUE Overall Strength Comments: Pt demonstrates AROM WFLS for shoulder, elbow, and wrist.  Strength overall 3+/5 in the shoulder and elbow, with 3+/5 in the wrist flexors and extensors as well.  Gross grasp and release present but noted atrophy in the thenar eminence and transverse arches.  Grip strength 1 lb.  Decreased ability to manipulate and open toothpaste or soap and could not sustain enough grip to pull up gripper socks once he started them over his feet.   See Function Navigator for Current Functional Status.   Refer to Care Plan for Long Term Goals  Recommendations for other services: Neuropsych   Discharge Criteria: Patient will be discharged from OT if patient refuses treatment 3 consecutive times without medical reason, if treatment goals not met, if there is a change in medical status, if patient makes no progress towards goals or if patient is discharged from  hospital.  The above assessment, treatment plan, treatment alternatives and goals were discussed and mutually agreed upon: by patient  Tymeshia Awan OTR/L  05/26/2017, 4:49 PM

## 2017-05-26 NOTE — Evaluation (Addendum)
Physical Therapy Assessment and Plan  Patient Details  Name: Russell [name] MRN: [id number] Date of Birth: Oct 25, 1970  PT Diagnosis: Difficulty walking, Impaired sensation and Muscle weakness Rehab Potential: Good ELOS: 6-9 days   Today's Date: 05/26/2017 PT Individual Time: 0900-1015 PT Individual Time Calculation (min): 75 min    Problem List:  Patient Active Problem List   Diagnosis Date Noted  . Debility 05/25/2017  . Diabetes mellitus type 2 in obese ([place])   . Diabetic peripheral neuropathy ([place])   . Diabetic gastroparesis ([place])   . Hypothyroidism   . Anemia of chronic disease   . Acute blood loss anemia   . Abnormal EKG   . Dysphagia   . Supplemental oxygen dependent   . Pulmonary edema 05/20/2017  . Chronic pain syndrome 04/23/2017  . Chronic peripheral neuropathic pain 04/23/2017  . DKA (diabetic ketoacidoses) ([place]) 03/19/2017  . CAP (community acquired pneumonia) 03/01/2017  . Anxiety 02/06/2017  . Asthma 02/06/2017  . 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 Hyperlipidemia 02/06/2017  . Macular degeneration 02/06/2017  . Neuropathy 02/06/2017  . Restrictive airway disease 02/06/2017  . CIDP (chronic inflammatory demyelinating polyneuropathy) ([place]) 01/10/2017  . Quadriparesis ([place]) 12/22/2016  . Gait abnormality 12/22/2016  . Hypertensive urgency 10/13/2016  . Acute pulmonary edema (HCC)   . Anemia in chronic kidney disease, on chronic dialysis ([place])   . Weakness   . Hyperkalemia 09/26/2016  . History of DVT of lower extremity 08/17/2016  . Arteriosclerotic vascular disease 08/17/2016  . DKA, type 1 ([place]) 06/29/2016  . Nausea vomiting and diarrhea 06/29/2016  . Malignant hypertension 06/29/2016  . Hypoxemic respiratory failure, chronic ([place]) 05/16/2016  . Anemia due to end stage renal disease ([place]) 05/16/2016  . Pre-transplant evaluation for kidney transplant 04/25/2016  . S/P CABG (coronary artery bypass graft) 09/28/2015  . Coronary artery disease involving  native coronary artery of native heart without angina pectoris   . ESRD (end stage renal disease) ([place]) 02/13/2015  . Nausea and vomiting 02/13/2015  . Nephrotic syndrome 01/03/2015  . Essential hypertension, benign   . Diabetic ketoacidosis without coma associated with type 2 diabetes mellitus ([place])   . Hx of gastroesophageal reflux (GERD) 09/30/2014  . Diabetic neuropathy ([place]) 09/21/2014  . Uncontrolled diabetes mellitus type 2 with peripheral artery disease ([place]) 09/21/2014  . Morbid obesity ([place]) 09/21/2014  . Proteinuria 07/09/2014  . Chest pain 04/22/2014  . Superficial thrombophlebitis 03/24/2014  . Hypothyroidism (acquired) 03/24/2014    Past Medical History:  Past Medical History:  Diagnosis Date  . Anxiety   . Asthma   . CIDP (chronic inflammatory demyelinating polyneuropathy) ([place]) 01/10/2017  . 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 Daily headache   . Depression   . DVT (deep venous thrombosis), H/o 01/2014-on Xarelto 03/24/2014   LLE  . ESRD (end stage renal disease) on dialysis [facility])    "[place], MWF" (06/29/2016)  . Gait abnormality 12/22/2016  . Gastroparesis 12/22/2016  . Hypertension   . Hypothyroidism   . Nephrotic syndrome 05/18/2014  . Neuropathy   . Type 2 diabetes mellitus with diabetic nephropathy [facility])    Past Surgical History:  Past Surgical History:  Procedure Laterality Date  . ANKLE FRACTURE SURGERY Right 1988  . AV FISTULA PLACEMENT Left 01/01/2015   Procedure: CREATION OF LEFT RADIAL CEPHALIC ARTERIOVENOUS (AV) FISTULA ;  Surgeon: Nelda Severe  Kellie Simmering, MD;  Location: [place];  Service: Vascular;  Laterality: Left;  . CAPD REMOVAL N/A 05/19/2016   Procedure: PD CATH REMOVAL;  Surgeon: Coralie Keens, MD;  Location: [place];  Service: General;  Laterality: N/A;  . CARDIAC CATHETERIZATION N/A 09/22/2015    Procedure: Left Heart Cath and Coronary Angiography;  Surgeon: Wellington Hampshire, MD;  Location: [place] CV LAB;  Service: Cardiovascular;  Laterality: N/A;  . CORONARY ARTERY BYPASS GRAFT N/A 09/28/2015   Procedure: CORONARY ARTERY BYPASS GRAFTING (CABG) x 3 UTILIZING LEFT MAMMARY ARTERY AND ENDOSCOPICALLY HARVESTED LEFT GREATER SAPHENOUS VEIN.;  Surgeon: [name], MD;  Location: [place];  Service: Open Heart Surgery;  Laterality: N/A;  . ENDOVEIN HARVEST OF GREATER SAPHENOUS VEIN Left 09/28/2015   Procedure: ENDOVEIN HARVEST OF GREATER SAPHENOUS VEIN;  Surgeon: [name], MD;  Location: [place];  Service: Open Heart Surgery;  Laterality: Left;  . ESOPHAGOGASTRODUODENOSCOPY N/A 03/29/2014   Procedure: ESOPHAGOGASTRODUODENOSCOPY (EGD);  Surgeon: Teena Irani, MD;  [facility];  Service: Endoscopy;  Laterality: N/A;  . EYE SURGERY    . FRACTURE SURGERY    . INSERTION OF DIALYSIS CATHETER Right 01/05/2015   Procedure: INSERTION OF RIGHT INTERNAL JUGULAR DIALYSIS CATHETER;  Surgeon: Conrad Augusta, MD;  Location: [place];  Service: Vascular;  Laterality: Right;  . LEFT HEART CATH AND CORS/GRAFTS ANGIOGRAPHY N/A 09/13/2016   Procedure: LEFT HEART CATH AND CORS/GRAFTS ANGIOGRAPHY;  Surgeon: Burnell Blanks, MD;  Location: [place] CV LAB;  Service: Cardiovascular;  Laterality: N/A;  . RETINAL LASER PROCEDURE Bilateral   . RIGHT/LEFT HEART CATH AND CORONARY/GRAFT ANGIOGRAPHY N/A 03/06/2017   Procedure: RIGHT/LEFT HEART CATH AND CORONARY/GRAFT ANGIOGRAPHY;  Surgeon: Leonie Man, MD;  Location: [place] CV LAB;  Service: Cardiovascular;  Laterality: N/A;  . TEE WITHOUT CARDIOVERSION N/A 09/28/2015   Procedure: TRANSESOPHAGEAL ECHOCARDIOGRAM (TEE);  Surgeon: [name], MD;  Location: [place];  Service: Open Heart Surgery;  Laterality: N/A;  . TONSILLECTOMY AND ADENOIDECTOMY  1970s    Assessment & Plan Clinical Impression: Russell [name] is a 47 y.o. male with  history of ESRD- HD MWF, CAD, T2DM--poorly controlled with gastroparesis and peripheral neuropathy, CIDP- monthly IVIG, gait disorder and asthma; who was admitted on 05/20/17 with N/V, SOB and left sided chest pain. History taken from chart review and patient. Last dialysis session on Friday before and he was found to have pulmonary edema, hyperkalemia and hyperglycemia. He was treated with IV calcium gluconate and HD with improvement in respiratory symptoms. He continues to have issues with abdominal discomfort and nausea as well as concerns about need for multiple medications. Therapy recommending CIR due to functional deficits. Pt to be admitted to CIR on 05/25/17. Patient transferred to CIR on 05/25/2017 .   Patient currently requires min with mobility secondary to muscle weakness, decreased safety awareness and decreased sitting balance, decreased standing balance, decreased postural control and decreased balance strategies.  Prior to hospitalization, patient was modified independent  with mobility and lived with Other (Comment)(parents) in a House home.  Home access is 1Stairs to enter.  Patient will benefit from skilled PT intervention to maximize safe functional mobility, minimize fall risk and decrease caregiver burden for planned discharge home with intermittent assist.  Anticipate patient will benefit from follow up [facility] at discharge.  PT - End of Session Activity Tolerance: Tolerates 30+ min activity with multiple rests PT Assessment Rehab Potential (ACUTE/IP ONLY): Good PT Patient demonstrates impairments in the following  area(s): Balance;Safety;Pain;Motor;Endurance PT Transfers Functional Problem(s): Bed Mobility;Bed to Chair;Car;Furniture PT Locomotion Functional Problem(s): Ambulation;Stairs PT Plan PT Intensity: Minimum of 1-2 x/day ,45 to 90 minutes PT Frequency: 5 out of 7 days PT Duration Estimated Length of Stay: 6-9 days PT Treatment/Interventions: Ambulation/gait  training;Balance/vestibular training;Community reintegration;DME/adaptive equipment instruction;Disease management/prevention;Discharge planning;Functional mobility training;Neuromuscular re-education;Pain management;Patient/family education;Stair training;Psychosocial support;Therapeutic Activities;Therapeutic Exercise;UE/LE Strength taining/ROM;UE/LE Coordination activities PT Transfers Anticipated Outcome(s): mod I PT Locomotion Anticipated Outcome(s): mod I in home, supervision in community  PT Recommendation Follow Up Recommendations: Home health PT Patient destination: Home Equipment Recommended: To be determined Equipment Details: may need rollator/RW at d/c  Skilled Therapeutic Intervention Pain as described below.  Session focus on initial PT evaluation, education in PT plan of care, goals, ELOS, and rehab process, as well as functional mobility and balance retraining.  PT instructed pt in gait with and without AD with overall min guard in controlled environment, and through obstacle course.  Patient demonstrates high fall risk as noted by score of 33/56 on Berg Balance Scale. Explained results and recommending up with assist from staff currently and progressing balance during therapy sessions to increase score and overall safety.  Discussed determining need for AD at d/c closer to that time.  Pt demos poor safety awareness and impulsivity throughout session, but reports this is his baseline.  Also asking about being able to mobilize independently in his room, educated that he would be made modified independent only when safe and likely closer to d/c.  Pt remains seated EOB with alarm activated at end of session, call bell in reach and needs met.   PT Evaluation Precautions/Restrictions Precautions Precautions: Fall Precaution Comments: impulsive Pain Pain Assessment Pain Scale: 0-10 Pain Score: 9  Pain Type: Chronic pain Pain Location: Hand Pain Orientation: Right;Left Patients  Stated Pain Goal: 3 Pain Intervention(s): Emotional support Home Living/Prior Functioning Home Living Available Help at Discharge: Family;Available 24 hours/day Type of Home: House Home Access: Stairs to enter [employer] of Steps: 1 Entrance Stairs-Rails: None Home Layout: One level  Lives With: Other (Comment)(parents) Prior Function Level of Independence: Independent with gait;Independent with transfers  Able to Take Stairs?: Yes Driving: Yes Vocation: On disability Vision/Perception  Perception Perception: Within Functional Limits Praxis Praxis: Intact  Cognition Overall Cognitive Status: No family/caregiver present to determine baseline cognitive functioning Arousal/Alertness: Awake/alert Orientation Level: Oriented X4 Memory: Appears intact Safety/Judgment: Impaired Sensation Sensation Light Touch: Impaired Detail Light Touch Impaired Details: Absent LLE;Absent RLE(below knees, unsure of accuracy) Additional Comments: reports decreased/absent sensation below the knee Coordination Gross Motor Movements are Fluid and Coordinated: No Fine Motor Movements are Fluid and Coordinated: Yes Motor  Motor Motor: Ataxia  Mobility Bed Mobility Bed Mobility: Supine to Sit Supine to Sit: 6: Modified independent (Device/Increase time);With rails;HOB elevated Transfers Transfers: Yes Sit to Stand: 4: Min guard Stand to Sit: 4: Min guard Stand Pivot Transfers: 4: Min guard Locomotion  Ambulation Ambulation: Yes Ambulation/Gait Assistance: 4: Min guard Ambulation Distance (Feet): 50 Feet Assistive device: None Gait Gait: Yes Gait Pattern: Impaired Gait Pattern: Step-through pattern;Ataxic Gait velocity: poor balance, impulsive Stairs / Additional Locomotion Stairs: Yes Stairs Assistance: 4: Min guard Stair Management Technique: Two rails;Step to pattern;Forwards Number of Stairs: 4 Height of Stairs: 6 Wheelchair Mobility Wheelchair Mobility: No   Trunk/Postural Assessment  Cervical Assessment Cervical Assessment: Within Functional Limits Thoracic Assessment Thoracic Assessment: Exceptions to WFL(rounded shoulders) Lumbar Assessment Lumbar Assessment: Within Functional Limits Postural Control Postural Control: Within Functional Limits  Balance Balance Balance Assessed: Yes  Standardized Balance Assessment Standardized Balance Assessment: Berg Balance Test Berg Balance Test Sit to Stand: Able to stand without using hands and stabilize independently Standing Unsupported: Able to stand 2 minutes with supervision Sitting with Back Unsupported but Feet Supported on Floor or Stool: Able to sit safely and securely 2 minutes Stand to Sit: Sits safely with minimal use of hands Transfers: Able to transfer safely, definite need of hands Standing Unsupported with Eyes Closed: Able to stand 10 seconds with supervision Standing Ubsupported with Feet Together: Needs help to attain position and unable to hold for 15 seconds From Standing, Reach Forward with Outstretched Arm: Can reach forward >12 cm safely (5") From Standing Position, Pick up Object from Floor: Able to pick up shoe, needs supervision From Standing Position, Turn to Look Behind Over each Shoulder: Turn sideways only but maintains balance Turn 360 Degrees: Needs close supervision or verbal cueing Standing Unsupported, Alternately Place Feet on Step/Stool: Able to complete >2 steps/needs minimal assist Standing Unsupported, One Foot in Front: Needs help to step but can hold 15 seconds Standing on One Leg: Tries to lift leg/unable to hold 3 seconds but remains standing independently Total Score: 33 Static Sitting Balance Static Sitting - Balance Support: Feet supported Static Sitting - Level of Assistance: 6: Modified independent (Device/Increase time) Dynamic Sitting Balance Dynamic Sitting - Balance Support: Feet supported Dynamic Sitting - Level of Assistance: 5: Stand by  assistance Static Standing Balance Static Standing - Balance Support: During functional activity Static Standing - Level of Assistance: 5: Stand by assistance Dynamic Standing Balance Dynamic Standing - Balance Support: During functional activity Dynamic Standing - Level of Assistance: 4: Min assist Extremity Assessment      RLE Assessment RLE Assessment: Exceptions to [facility] RLE Strength Right Hip Flexion: 3+/5 Right Knee Flexion: 4/5 Right Knee Extension: 3+/5 Right Ankle Dorsiflexion: 3+/5 Right Ankle Plantar Flexion: 3/5 LLE Assessment LLE Assessment: Exceptions to [facility] LLE Strength Left Hip Flexion: 3+/5 Left Knee Flexion: 4+/5 Left Knee Extension: 4/5 Left Ankle Dorsiflexion: 3/5 Left Ankle Plantar Flexion: 3+/5   See Function Navigator for Current Functional Status.   Refer to Care Plan for Long Term Goals  Recommendations for other services: None   Discharge Criteria: Patient will be discharged from PT if patient refuses treatment 3 consecutive times without medical reason, if treatment goals not met, if there is a change in medical status, if patient makes no progress towards goals or if patient is discharged from hospital.  The above assessment, treatment plan, treatment alternatives and goals were discussed and mutually agreed upon: by patient  Michel Santee 05/26/2017, 10:07 AM

## 2017-05-26 NOTE — Progress Notes (Signed)
Physical Therapy Session Note  Patient Details  Name: Russell [name] MRN: [id number] Date of Birth: [birth year]  Today's Date: 05/26/2017 PT Individual Time: 1115-1200 PT Individual Time Calculation (min): 45 min   Short Term Goals: Week 1:     Skilled Therapeutic Interventions/Progress Updates:  C/o pain as in previous session (8-9/10), but reports it "comes and goes".  Noted bed alarm de-activated, but had been activated by this therapist at end of previous session.  NT/RN aware.  Session focus on dynamic gait and activity tolerance with nustep and ambulation.    Pt ambulates throughout unit with no device, min guard for safety, max distance 200'.  Pt ambulates with progressively staggering gait and towards end of session grabbing for rails in hallway for balance.  Cues for pacing throughout.  PT administered DGI with results below.  Discussed results of increased challenge with any challenge to gait (changing speed, turns, head turns, stops, etc), and implications on fall risk.  Pt completes 10 minutes on nustep with 1 rest break at 5 minutes.  Level 3 with focus on pacing, breathing, and cardiovascular endurance.  Pt returned to room at end of session and positioned upright in bed with call bell in reach, bed alarm activated, and needs met.   Therapy Documentation Precautions:  Precautions Precautions: Fall Precaution Comments: impulsive  Balance Dynamic Gait Index Level Surface: Mild Impairment Change in Gait Speed: Mild Impairment Gait with Horizontal Head Turns: Moderate Impairment Gait with Vertical Head Turns: Moderate Impairment Gait and Pivot Turn: Moderate Impairment Step Over Obstacle: Moderate Impairment Step Around Obstacles: Mild Impairment Steps: Moderate Impairment Total Score: 11    See Function Navigator for Current Functional Status.   Therapy/Group: Individual Therapy  Michel Santee 05/26/2017, 12:00 PM

## 2017-05-26 NOTE — Progress Notes (Signed)
[facility] Progress Note   Subjective:   C/os neuropathy pain, offered to increase gabapentin to  (max dose for HD patient) and he refused, stating "they just keep changing it back and forth.)  Also c/o indigestion - chronic problem, supposed to have appt as OP with GI on Wed.   Objective Vitals:   05/25/17 1800 05/25/17 2111 05/25/17 2111 05/26/17 0444  BP:   138/70 106/90  Pulse:   94 85  Resp:   16 16  Temp:  97.7 F (36.5 C)  (!) 97.5 F (36.4 C)  TempSrc:  Oral  Oral  SpO2:   98% 99%  Weight: 119.8 kg (264 lb 1.8 oz)     Height:  (1.88 m)      Physical Exam General:NAD, WDWN sitting on edge of bed Heart:RRR, no mrg Lungs:mostly CTAB, decreased at bases Abdomen:soft, NTND Extremities:trace LE edema Dialysis Access: LU AVF +b/t   Filed Weights   05/25/17 1800  Weight: 119.8 kg (264 lb 1.8 oz)    Intake/Output Summary (Last 24 hours) at 05/26/2017 1339 Last data filed at 05/25/2017 1800 Gross per 24 hour  Intake 360 ml  Output -  Net 360 ml    Additional Objective Labs: Basic Metabolic Panel: Recent Labs  Lab 05/21/17 0817 05/23/17 0828 05/25/17 0836  NA 134* 133* 129*  K 4.5 5.2* 5.0  CL 87* 93* 90*  CO2 GLUCOSE 129* 186* 92  BUN 57* 54* 50*  CREATININE 10.94* 10.34* 9.18*  CALCIUM 8.7* 8.7* 9.2  PHOS 8.7*  --  6.8*   Liver Function Tests: Recent Labs  Lab 05/20/17 1316 05/21/17 0817 05/25/17 0836  AST 20  --   --   ALT 18  --   --   ALKPHOS 77  --   --   BILITOT 1.1  --   --   PROT 7.3  --   --   ALBUMIN 4.0 3.3* 3.6   Recent Labs  Lab 05/20/17 1316  LIPASE 32   CBC: Recent Labs  Lab 05/20/17 1316 05/21/17 0817 05/22/17 0555 05/23/17 0828 05/25/17 0836  WBC 9.5 12.6* 8.1 7.2 8.1  NEUTROABS  --   --  5.2  --   --   HGB 10.8* 9.5* 9.6* 8.0* 9.4*  HCT 32.4* 28.8* 30.3* 24.9* 29.1*  MCV 90.8 91.1 95.9 94.0 94.5  PLT 250 258 241 281 258   Blood Culture    Component Value Date/Time   SDES  BLOOD RIGHT HAND 05/16/2016 2032   SPECREQUEST  05/16/2016 2032    BOTTLES DRAWN AEROBIC AND ANAEROBIC Blood Culture adequate volume   CULT NO GROWTH 5 DAYS 05/16/2016 2032   REPTSTATUS 05/21/2016 FINAL 05/16/2016 2032    Cardiac Enzymes: Recent Labs  Lab 05/20/17 1451 05/20/17 2205 05/21/17 0522  TROPONINI 0.05* 0.06* 0.07*   CBG: Recent Labs  Lab 05/24/17 1131 05/24/17 1653 05/24/17 2232 05/25/17 0746 05/25/17 1317  GLUCAP 153* 128* 155* 85 111*   Iron Studies: No results for input(s): IRON, TIBC, TRANSFERRIN, FERRITIN in the last 72 hours. Lab Results  Component Value Date   INR 1.18 03/06/2017   INR 1.0 09/12/2016   INR 1.25 05/16/2016   Studies/Results: No results found.  Medications:  . aspirin  81 mg Oral Daily  . atorvastatin  80 mg Oral q1800  . [START ON 05/28/2017] calcitRIOL  1.25 mcg Oral Q M,W,F-HD  . calcium acetate  2,001 mg Oral TID WC  . [START  ON 05/28/2017] cinacalcet  30 mg Oral Q M,W,F  . [START ON 05/30/2017] darbepoetin (ARANESP) injection - DIALYSIS  100 mcg Intravenous Q Wed-HD  . DULoxetine  30 mg Oral Daily  . fluticasone  1 spray Each Nare Daily  . gabapentin  200 mg Oral QHS  . heparin  5,000 Units Subcutaneous Q8H  . insulin aspart  0-15 Units Subcutaneous TID WC  . insulin aspart  0-5 Units Subcutaneous QHS  . insulin glargine  15 Units Subcutaneous BID  . levothyroxine  50 mcg Oral QAC breakfast  . metoCLOPramide  5 mg Oral q12n4p  . metoprolol succinate  37.5 mg Oral BID  . pantoprazole  40 mg Oral BID AC    Dialysis Orders: MWFAshe 4.25 HR EDW 112.5left AVF450/800 EDW 116,K 2 Ca 2.25, ufprofile 4 .heparin none.parsabiv 5,venofer 50/week ,Mircera 75 mcg (last given 05/02/17 )calcitriol 1.7mcg po q hd  Assessment/Plan: 1. Hyperkalemia - improved w/HD. K 5.0. Follow trends. 2. ESRD - K 5.0 today. Recheck in the AM. Continue per regular MWF schedule. 3. Anemia of CKD- Hgb 9.4. BP dropping with HD, need to ensure HTN  meds held prior.  Continue Aranesp IV qwk (Wed), weekly Fe. Follow trends. 4. Secondary hyperparathyroidism - CCa 9.5, Phos 6.8.  Continue binders, sensiapr, VDRA. Unable to give parsabiv b/c not available in hospital. Hx non compliance with binders as OP 5. HTN/volume - BP well controlled on current regimen. Unable to  full goal d/t hypotension.  Order written to hold Toprol prior to HD. Need to change EDW at d/c.  6. Nutrition - Renal diet w/fluid restrictions. Renavite. 7. Anxiety/Depression - per primary 8. Gastroparesis - supposed to have OP GI appt on Wed 5/29 at [facility]. Per primary 9. 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  ICR Admit. 10. Hx CM EF 25-35% 11. DMT2 - per admit  Virgina Norfolk, PA-C [facility] Pager: [phone] 05/26/2017,1:39 PM  LOS: 1 day

## 2017-05-27 ENCOUNTER — Inpatient Hospital Stay (HOSPITAL_COMMUNITY): Payer: Medicare Other

## 2017-05-27 MED ORDER — CHLORHEXIDINE GLUCONATE CLOTH 2 % EX PADS: 6.0000 | MEDICATED_PAD | Freq: Every day | CUTANEOUS | Status: DC

## 2017-05-27 MED ORDER — PREGABALIN 75 MG PO CAPS
75.0000 mg | ORAL_CAPSULE | Freq: Every day | ORAL | Status: DC
  Administered 2017-05-27 – 2017-06-01 (×6): 75 mg via ORAL
  Filled 2017-05-27 (×6): qty 1

## 2017-05-27 NOTE — Progress Notes (Signed)
Subjective/Complaints:  We discussed his chronic neuropathic pain.  He has been on Cymbalta as well as gabapentin.  Has not tried Lyrica.  Uses Vicodin every day at home.  He has a few tablets of Dilaudid which he uses "in emergencies"  ROS- neg for N/V/D, no CP or SOB, +tingling in hands with numbness in feet  Objective: Vital Signs: Blood pressure 117/68, pulse 75, temperature (!) 96.5 F (35.8 C), temperature source Oral, resp. rate (!) 24, height 6' 2"  (1.88 m), weight 122.7 kg (270 lb 8.1 oz), SpO2 100 %. Dg Chest Port 1 View  Result Date: 05/27/2017 CLINICAL DATA:  Shortness of breath. EXAM: PORTABLE CHEST 1 VIEW COMPARISON:  Radiograph of May 20, 2017. FINDINGS: Stable cardiomegaly. Status post coronary artery bypass graft. No pneumothorax or pleural effusion is noted. Both lungs are clear. The visualized skeletal structures are unremarkable. IMPRESSION: No acute cardiopulmonary abnormality seen. Electronically Signed   By: Marijo Conception, M.D.   On: 05/27/2017 10:02   Results for orders placed or performed during the hospital encounter of 05/20/17 (from the past 72 hour(s))  Glucose, capillary     Status: Abnormal   Collection Time: 05/24/17  4:53 PM  Result Value Ref Range   Glucose-Capillary 128 (H) 65 - 99 mg/dL  Glucose, capillary     Status: Abnormal   Collection Time: 05/24/17 10:32 PM  Result Value Ref Range   Glucose-Capillary 155 (H) 65 - 99 mg/dL  Glucose, capillary     Status: None   Collection Time: 05/25/17  7:46 AM  Result Value Ref Range   Glucose-Capillary 85 65 - 99 mg/dL  CBC     Status: Abnormal   Collection Time: 05/25/17  8:36 AM  Result Value Ref Range   WBC 8.1 4.0 - 10.5 K/uL   RBC 3.08 (L) 4.22 - 5.81 MIL/uL   Hemoglobin 9.4 (L) 13.0 - 17.0 g/dL   HCT 29.1 (L) 39.0 - 52.0 %   MCV 94.5 78.0 - 100.0 fL   MCH 30.5 26.0 - 34.0 pg   MCHC 32.3 30.0 - 36.0 g/dL   RDW 15.2 11.5 - 15.5 %   Platelets 258 150 - 400 K/uL    Comment: Performed at [facility], [place] [address], [place], [place] [address]  Renal function panel     Status: Abnormal   Collection Time: 05/25/17  8:36 AM  Result Value Ref Range   Sodium 129 (L) 135 - 145 mmol/L   Potassium 5.0 3.5 - 5.1 mmol/L   Chloride 90 (L) 101 - 111 mmol/L   CO2 26 22 - 32 mmol/L   Glucose, Bld 92 65 - 99 mg/dL   BUN 50 (H) 6 - 20 mg/dL   Creatinine, Ser 9.18 (H) 0.61 - 1.24 mg/dL   Calcium 9.2 8.9 - 10.3 mg/dL   Phosphorus 6.8 (H) 2.5 - 4.6 mg/dL   Albumin 3.6 3.5 - 5.0 g/dL   GFR calc non Af Amer 6 (L) >60 mL/min   GFR calc Af Amer 7 (L) >60 mL/min    Comment: (NOTE) The eGFR has been calculated using the CKD EPI equation. This calculation has not been validated in all clinical situations. eGFR's persistently <60 mL/min signify possible Chronic Kidney Disease.    Anion gap 13 5 - 15    Comment: Performed at [facility] [address], [place], [place] [address]  Glucose, capillary     Status: Abnormal   Collection Time: 05/25/17  1:17 PM  Result Value Ref Range   Glucose-Capillary 111 (H) 65 - 99 mg/dL     HEENT: normal Cardio: RRR and no murmur Resp: CTA B/L and unlabored GI: BS positive and NT, ND Extremity:  No Edema Skin:   Intact Neuro: Alert/Oriented, Cranial Nerve II-XII normal, Abnormal Sensory absent LT sensation below the knees, Abnormal Motor 5/5 bilateral del bi tri, 3- bilateral hand intrinsic, and grip, 4/5 bilateral HF, KE, 3- Bilateral ankle DF, trace toe flex and Abnormal FMC Ataxic/ dec FMC Musc/Skel:  Other hand intrinsic minus deformities Gen NAD   Assessment/Plan: 1. Functional deficits secondary to Debility due to acute renal failure which require 3+ hours per day of interdisciplinary therapy in a comprehensive inpatient rehab setting. Physiatrist is providing close team supervision and 24 hour management of active medical problems listed below. Physiatrist and rehab team continue to assess barriers to discharge/monitor patient  progress toward functional and medical goals. FIM: Function - Bathing Position: Shower Body parts bathed by patient: Right arm, Left arm, Chest, Abdomen, Front perineal area, Buttocks, Right upper leg, Left upper leg, Left lower leg, Right lower leg Bathing not applicable: Back(did not attempt) Assist Level: Supervision or verbal cues  Function- Upper Body Dressing/Undressing What is the patient wearing?: Pull over shirt/dress Pull over shirt/dress - Perfomed by patient: Thread/unthread right sleeve, Thread/unthread left sleeve, Put head through opening, Pull shirt over trunk Assist Level: Supervision or verbal cues Function - Lower Body Dressing/Undressing What is the patient wearing?: Pants, Shoes, Non-skid slipper socks Position: Other (comment)(standing next to the shower with use of the wall and grab bar ) Pants- Performed by patient: Thread/unthread right pants leg, Thread/unthread left pants leg, Pull pants up/down Non-skid slipper socks- Performed by helper: Don/doff right sock, Don/doff left sock Shoes - Performed by patient: Don/doff right shoe, Don/doff left shoe        Function - Chair/bed transfer Chair/bed transfer method: Stand pivot Chair/bed transfer assist level: Supervision or verbal cues Chair/bed transfer details: Verbal cues for precautions/safety  Function - Locomotion: Wheelchair Will patient use wheelchair at discharge?: No Function - Locomotion: Ambulation Assistive device: Other (comment)(rollator) Max distance: 150' Assist level: Supervision or verbal cues Assist level: Touching or steadying assistance (Pt > 75%) Assist level: Touching or steadying assistance (Pt > 75%) Assist level: Touching or steadying assistance (Pt > 75%) Assist level: Touching or steadying assistance (Pt > 75%)  Function - Comprehension Comprehension: Auditory Comprehension assist level: Follows basic conversation/direction with extra time/assistive device  Function -  Expression Expression: Verbal Expression assist level: Expresses basic needs/ideas: With no assist  Function - Social Interaction Social Interaction assist level: Interacts appropriately with others with medication or extra time (anti-anxiety, antidepressant).  Function - Problem Solving Problem solving assist level: Solves basic 90% of the time/requires cueing < 10% of the time  Function - Memory Memory assist level: Recognizes or recalls 90% of the time/requires cueing < 10% of the time Patient normally able to recall (first 3 days only): Current season, Location of own room, Staff names and faces, That he or she is in a hospital   Medical Problem List and Plan: 1.  Limitations with mobility, endurance, and self-care secondary to debility. CIR PT OT 2.  DVT Prophylaxis/Anticoagulation: Pharmaceutical: Heparin 3. Pain Management: Continue hydrocodone and flexeril prn, trial Lyrica in place of gabapentin 4. Mood: LCSW to follow for evaluation and support.  5. Neuropsych: This patient is capable of making decisions on his own behalf. 6. Skin/Wound Care: routine  pressure relief measures.  7. Fluids/Electrolytes/Nutrition: Monitor I/O. Renal/diabetic diet with 1200 cc FR/Day 8. T2DM with gastroparesis and peripheral neuropathy: Continue reglan bid for GI symptoms. Monitor BS ac/hs. On Lantus 15 units bid with SSI for elevated BS.  CBG (last 3)  Recent Labs    05/24/17 2232 05/25/17 0746 05/25/17 1317  GLUCAP 155* 85 111*  CBGs not recorded consistently 9. CAD/HTN: Monitor BP bid--on metoprolol,  10.ESRD:Nephrology following  Monitor weight daily with strict I/O. Schedule HD at end of day to help with tolerance of activity. 11. Persistent SOB: Continue nebulizers prn. Encourage IS.     LOS (Days) 2 A FACE TO FACE EVALUATION WAS PERFORMED  Charlett Blake 05/27/2017, 11:55 AM

## 2017-05-27 NOTE — Progress Notes (Signed)
[facility] Progress Note   Subjective:   C/os SOB states "my chest feels full, like I can feel fluid moving around when I breathe."    Objective Vitals:   05/26/17 2104 05/27/17 0500 05/27/17 0604 05/27/17 0935  BP: (!) 153/88  (!) 105/49 117/68  Pulse: 81  72 75  Resp: 18   (!) 24  Temp: (!) 97.5 F (36.4 C)  (!) 96.5 F (35.8 C)   TempSrc: Oral  Oral   SpO2: 100%  99% 100%  Weight:  122.7 kg (270 lb 8.1 oz)    Height:       Physical Exam General:NAD, WDWN obese male sitting in bed Heart:RRR, no mrg Lungs:mostly CTAB, slightly decreased BS b/l Abdomen:soft, NTND Extremities:trace LE edmea Dialysis Access: LU AVF +b/t   Filed Weights   05/25/17 1800 05/27/17 0500  Weight: 119.8 kg (264 lb 1.8 oz) 122.7 kg (270 lb 8.1 oz)    Intake/Output Summary (Last 24 hours) at 05/27/2017 1102 Last data filed at 05/27/2017 0800 Gross per 24 hour  Intake 960 ml  Output -  Net 960 ml    Additional Objective Labs: Basic Metabolic Panel: Recent Labs  Lab 05/21/17 0817 05/23/17 0828 05/25/17 0836  NA 134* 133* 129*  K 4.5 5.2* 5.0  CL 87* 93* 90*  CO2 GLUCOSE 129* 186* 92  BUN 57* 54* 50*  CREATININE 10.94* 10.34* 9.18*  CALCIUM 8.7* 8.7* 9.2  PHOS 8.7*  --  6.8*   Liver Function Tests: Recent Labs  Lab 05/20/17 1316 05/21/17 0817 05/25/17 0836  AST 20  --   --   ALT 18  --   --   ALKPHOS 77  --   --   BILITOT 1.1  --   --   PROT 7.3  --   --   ALBUMIN 4.0 3.3* 3.6   Recent Labs  Lab 05/20/17 1316  LIPASE 32   CBC: Recent Labs  Lab 05/20/17 1316 05/21/17 0817 05/22/17 0555 05/23/17 0828 05/25/17 0836  WBC 9.5 12.6* 8.1 7.2 8.1  NEUTROABS  --   --  5.2  --   --   HGB 10.8* 9.5* 9.6* 8.0* 9.4*  HCT 32.4* 28.8* 30.3* 24.9* 29.1*  MCV 90.8 91.1 95.9 94.0 94.5  PLT 250 258 241 281 258    Cardiac Enzymes: Recent Labs  Lab 05/20/17 1451 05/20/17 2205 05/21/17 0522  TROPONINI 0.05* 0.06* 0.07*   CBG: Recent Labs  Lab  05/24/17 1131 05/24/17 1653 05/24/17 2232 05/25/17 0746 05/25/17 1317  GLUCAP 153* 128* 155* 85 111*  Studies/Results: Dg Chest Port 1 View  Result Date: 05/27/2017 CLINICAL DATA:  Shortness of breath. EXAM: PORTABLE CHEST 1 VIEW COMPARISON:  Radiograph of May 20, 2017. FINDINGS: Stable cardiomegaly. Status post coronary artery bypass graft. No pneumothorax or pleural effusion is noted. Both lungs are clear. The visualized skeletal structures are unremarkable. IMPRESSION: No acute cardiopulmonary abnormality seen. Electronically Signed   By: Lupita Raider, M.D.   On: 05/27/2017 10:02    Medications:  . aspirin  81 mg Oral Daily  . atorvastatin  80 mg Oral q1800  . [START ON 05/28/2017] calcitRIOL  1.25 mcg Oral Q M,W,F-HD  . calcium acetate  2,001 mg Oral TID WC  . [START ON 05/28/2017] cinacalcet  30 mg Oral Q M,W,F  . [START ON 05/30/2017] darbepoetin (ARANESP) injection - DIALYSIS  100 mcg Intravenous Q Wed-HD  . DULoxetine  30 mg Oral Daily  .  fluticasone  1 spray Each Nare Daily  . gabapentin  200 mg Oral QHS  . heparin  5,000 Units Subcutaneous Q8H  . insulin aspart  0-15 Units Subcutaneous TID WC  . insulin aspart  0-5 Units Subcutaneous QHS  . insulin glargine  15 Units Subcutaneous BID  . levothyroxine  50 mcg Oral QAC breakfast  . metoCLOPramide  5 mg Oral q12n4p  . metoprolol succinate  37.5 mg Oral BID  . pantoprazole  40 mg Oral BID AC    Dialysis Orders: MWFAshe 4.25 HR EDW 112.5left AVF450/800 EDW 116,K 2 Ca 2.25, ufprofile 4 .heparin none.parsabiv 5,venofer 50/week ,Mircera 75 mcg (last given 05/02/17 )calcitriol 1.61mcg po q hd   Assessment/Plan: 1. Hyperkalemia - improved w/HD. Follow trends. 2. SOB - C/os SOB, O2 sats 100% on RA, CXR shows no signs pulm edema, no LE edema on exam, lung sounds grossly clear, slightly diminished.  No indications or emergent HD, plan for HD tomorrow 1st shift if possible.  3. ESRD - K 5.0 pre HD on Friday. Continue  per regular MWF schedule with labs prior. Hard to facilitate volume removal due to hypotension, order to hold HTN meds prior to HD.  4. Anemia of CKD- Hgb 9.4. Continue Aranesp IV qwk (Wed), weekly Fe. Follow trends. 5. Secondary hyperparathyroidism - CCa 9.5, Phos 6.8.  Continue binders, sensiapr, VDRA. Unable to give parsabiv b/c not available in hospital. Hx non compliance with binders as OP 6. HTN/volume - BP well controlled on current regimen. Unable to  full goal d/t hypotension.  Order written to hold Toprol prior to HD. Need to change EDW at d/c.  7. Nutrition - Renal diet w/fluid restrictions. Renavite. 8. Anxiety/Depression - per primary 9. Gastroparesis - supposed to have OP GI appt on Wed 5/29 at [facility]. Per primary 10. 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  ICR Admit. 11. Hx CM EF 25-35% 12. DMT2 - per admit    Virgina Norfolk, PA-C [facility] Pager: [phone] 05/27/2017,11:02 AM  LOS: 2 days

## 2017-05-28 ENCOUNTER — Inpatient Hospital Stay (HOSPITAL_COMMUNITY): Payer: Medicare Other

## 2017-05-28 ENCOUNTER — Inpatient Hospital Stay (HOSPITAL_COMMUNITY): Payer: Medicare Other | Admitting: Occupational Therapy

## 2017-05-28 ENCOUNTER — Inpatient Hospital Stay (HOSPITAL_COMMUNITY): Payer: Medicare Other | Admitting: Physical Therapy

## 2017-05-28 ENCOUNTER — Encounter (HOSPITAL_COMMUNITY): Payer: Self-pay | Admitting: *Deleted

## 2017-05-28 DIAGNOSIS — N186 End stage renal disease: Secondary | ICD-10-CM

## 2017-05-28 DIAGNOSIS — E1169 Type 2 diabetes mellitus with other specified complication: Secondary | ICD-10-CM

## 2017-05-28 DIAGNOSIS — D638 Anemia in other chronic diseases classified elsewhere: Secondary | ICD-10-CM

## 2017-05-28 DIAGNOSIS — E669 Obesity, unspecified: Secondary | ICD-10-CM

## 2017-05-28 LAB — CBC
HCT: 27.6 % — ABNORMAL LOW (ref 39.0–52.0)
Hemoglobin: 9.1 g/dL — ABNORMAL LOW (ref 13.0–17.0)
MCH: 31.2 pg (ref 26.0–34.0)
MCHC: 33 g/dL (ref 30.0–36.0)
MCV: 94.5 fL (ref 78.0–100.0)
Platelets: 210 10*3/uL (ref 150–400)
RBC: 2.92 MIL/uL — AB (ref 4.22–5.81)
RDW: 15.4 % (ref 11.5–15.5)
WBC: 5.3 10*3/uL (ref 4.0–10.5)

## 2017-05-28 LAB — RENAL FUNCTION PANEL
ANION GAP: 15 (ref 5–15)
Albumin: 3.3 g/dL — ABNORMAL LOW (ref 3.5–5.0)
BUN: 60 mg/dL — ABNORMAL HIGH (ref 6–20)
CALCIUM: 9.4 mg/dL (ref 8.9–10.3)
CO2: 26 mmol/L (ref 22–32)
CREATININE: 10.66 mg/dL — AB (ref 0.61–1.24)
Chloride: 92 mmol/L — ABNORMAL LOW (ref 101–111)
GFR, EST AFRICAN AMERICAN: 6 mL/min — AB (ref >60)
GFR, EST NON AFRICAN AMERICAN: 5 mL/min — AB (ref >60)
Glucose, Bld: 169 mg/dL — ABNORMAL HIGH (ref 65–99)
Phosphorus: 5.9 mg/dL — ABNORMAL HIGH (ref 2.5–4.6)
Potassium: 5.3 mmol/L — ABNORMAL HIGH (ref 3.5–5.1)
Sodium: 133 mmol/L — ABNORMAL LOW (ref 135–145)

## 2017-05-28 LAB — GLUCOSE, CAPILLARY
GLUCOSE-CAPILLARY: 148 mg/dL — AB (ref 65–99)
GLUCOSE-CAPILLARY: 203 mg/dL — AB (ref 65–99)
Glucose-Capillary: 145 mg/dL — ABNORMAL HIGH (ref 65–99)

## 2017-05-28 MED ORDER — SODIUM CHLORIDE 0.9 % IV SOLN: 100.0000 mL | INTRAVENOUS | Status: DC | PRN

## 2017-05-28 MED ORDER — HYPROMELLOSE (GONIOSCOPIC) 2.5 % OP SOLN
1.0000 [drp] | OPHTHALMIC | Status: DC | PRN
Start: 2017-05-29 — End: 2017-06-01
  Administered 2017-05-29: 1 [drp] via OPHTHALMIC
  Filled 2017-05-28: qty 15

## 2017-05-28 MED ORDER — CALCITRIOL 0.25 MCG PO CAPS
ORAL_CAPSULE | ORAL | Status: AC
  Filled 2017-05-28: qty 1

## 2017-05-28 MED ORDER — ALTEPLASE 2 MG IJ SOLR: 2.0000 mg | Freq: Once | INTRAMUSCULAR | Status: DC | PRN

## 2017-05-28 MED ORDER — HEPARIN SODIUM (PORCINE) 1000 UNIT/ML DIALYSIS: 1000.0000 [IU] | INTRAMUSCULAR | Status: DC | PRN

## 2017-05-28 MED ORDER — PENTAFLUOROPROP-TETRAFLUOROETH EX AERO: 1.0000 "application " | INHALATION_SPRAY | CUTANEOUS | Status: DC | PRN

## 2017-05-28 MED ORDER — LIDOCAINE-PRILOCAINE 2.5-2.5 % EX CREA: 1.0000 "application " | TOPICAL_CREAM | CUTANEOUS | Status: DC | PRN

## 2017-05-28 MED ORDER — CALCITRIOL 0.5 MCG PO CAPS
ORAL_CAPSULE | ORAL | Status: AC
  Filled 2017-05-28: qty 2

## 2017-05-28 MED ORDER — LIDOCAINE HCL (PF) 1 % IJ SOLN: 5.0000 mL | INTRAMUSCULAR | Status: DC | PRN

## 2017-05-28 NOTE — Significant Event (Signed)
Pt refused VTE heparin stating that he had eye surgery 2 weeks ago.

## 2017-05-28 NOTE — Progress Notes (Signed)
Physical Therapy Note  Patient Details  Name: JOSEANDRES MAZER MRN: [id number] Date of Birth: 05-Jun-1970 Today's Date: 05/28/2017   Pt missed (2) 30 min sessions this AM due to being off of floor for HD.     Jakyra Kenealy 05/28/2017, 4:52 PM

## 2017-05-28 NOTE — Progress Notes (Signed)
Patient information reviewed and entered into eRehab system by Rebekkah Powless, RN, CRRN, PPS Coordinator.  Information including medical coding and functional independence measure will be reviewed and updated through discharge.     Per nursing patient was given "Data Collection Information Summary for Patients in Inpatient Rehabilitation Facilities with attached "Privacy Act Statement-Health Care Records" upon admission.  

## 2017-05-28 NOTE — Plan of Care (Signed)
  Problem: Consults Goal: RH GENERAL PATIENT EDUCATION Description See Patient Education module for education specifics. Outcome: Progressing Goal: Skin Care Protocol Initiated - if Braden Score 18 or less Description If consults are not indicated, leave blank or document N/A Outcome: Progressing Goal: Nutrition Consult-if indicated Outcome: Progressing Goal: Diabetes Guidelines if Diabetic/Glucose > 140 Description If diabetic or lab glucose is > 140 mg/dl - Initiate Diabetes/Hyperglycemia Guidelines & Document Interventions  Outcome: Progressing   Problem: RH BOWEL ELIMINATION Goal: RH STG MANAGE BOWEL WITH ASSISTANCE Description STG Manage Bowel with  Mod I Assistance.  Outcome: Progressing Goal: RH STG MANAGE BOWEL W/MEDICATION W/ASSISTANCE Description STG Manage Bowel with Medication with mod I  Assistance.  Outcome: Progressing   Problem: RH SKIN INTEGRITY Goal: RH STG SKIN FREE OF INFECTION/BREAKDOWN Description Maintain skin integrity with min assist  Outcome: Progressing Goal: RH STG MAINTAIN SKIN INTEGRITY WITH ASSISTANCE Description STG Maintain Skin Integrity With  Min Assistance.  Outcome: Progressing   Problem: RH SAFETY Goal: RH STG ADHERE TO SAFETY PRECAUTIONS W/ASSISTANCE/DEVICE Description STG Adhere to Safety Precautions With cues/resources Assistance/Device.  Outcome: Progressing Goal: RH STG DECREASED RISK OF FALL WITH ASSISTANCE Description STG Decreased Risk of Fall With  Cues/reminders Assistance.  Outcome: Progressing   Problem: RH PAIN MANAGEMENT Goal: RH STG PAIN MANAGED AT OR BELOW PT'S PAIN GOAL Description At or below level 5  Outcome: Progressing   Problem: RH KNOWLEDGE DEFICIT GENERAL Goal: RH STG INCREASE KNOWLEDGE OF SELF CARE AFTER HOSPITALIZATION Description Pt will be able to explain care at home and direct caregivers independently  Outcome: Progressing   Problem: RH Other (Specify) Goal: RH LTG Other (Specify)1 Outcome:  Progressing

## 2017-05-28 NOTE — Progress Notes (Signed)
Pt missed 60 min of OT services today as he was off the unit for dialysis.

## 2017-05-28 NOTE — Progress Notes (Signed)
Social Work  Social Work Assessment and Plan  Patient Details  Name: Russell [name] MRN: [id number] Date of Birth: 07/06/1970  Today's Date: 05/28/2017  Problem List:  Patient Active Problem List   Diagnosis Date Noted  . Debility 05/25/2017  . Diabetes mellitus type 2 in obese (HCC)   . Diabetic peripheral neuropathy (HCC)   . Diabetic gastroparesis (HCC)   . Hypothyroidism   . Anemia of chronic disease   . Acute blood loss anemia   . Abnormal EKG   . Dysphagia   . Supplemental oxygen dependent   . Pulmonary edema 05/20/2017  . Chronic pain syndrome 04/23/2017  . Chronic peripheral neuropathic pain 04/23/2017  . DKA (diabetic ketoacidoses) (HCC) 03/19/2017  . CAP (community acquired pneumonia) 03/01/2017  . Anxiety 02/06/2017  . Asthma 02/06/2017  . 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 Hyperlipidemia 02/06/2017  . Macular degeneration 02/06/2017  . Neuropathy 02/06/2017  . Restrictive airway disease 02/06/2017  . 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 Quadriparesis (HCC) 12/22/2016  . Gait abnormality 12/22/2016  . Hypertensive urgency 10/13/2016  . Acute pulmonary edema (HCC)   . Anemia in chronic kidney disease, on chronic dialysis (HCC)   . Weakness   . Hyperkalemia 09/26/2016  . History of DVT of lower extremity 08/17/2016  . Arteriosclerotic vascular disease 08/17/2016  . DKA, type 1 (HCC) 06/29/2016  . Nausea vomiting and diarrhea 06/29/2016  . Malignant hypertension 06/29/2016  . Hypoxemic respiratory failure, chronic (HCC) 05/16/2016  . Anemia due to end stage renal disease (HCC) 05/16/2016  . Pre-transplant evaluation for kidney transplant 04/25/2016  . S/P CABG (coronary artery bypass graft) 09/28/2015  . Coronary artery disease involving native coronary artery of native heart without angina pectoris   . ESRD (end stage renal disease) (HCC) 02/13/2015  . Nausea and vomiting 02/13/2015  . Nephrotic syndrome 01/03/2015  .  Essential hypertension, benign   . Diabetic ketoacidosis without coma associated with type 2 diabetes mellitus (HCC)   . Hx of gastroesophageal reflux (GERD) 09/30/2014  . Diabetic neuropathy (HCC) 09/21/2014  . Uncontrolled diabetes mellitus type 2 with peripheral artery disease (HCC) 09/21/2014  . Morbid obesity (HCC) 09/21/2014  . Proteinuria 07/09/2014  . Chest pain 04/22/2014  . Superficial thrombophlebitis 03/24/2014  . Hypothyroidism (acquired) 03/24/2014   Past Medical History:  Past Medical History:  Diagnosis Date  . Anxiety   . Asthma   . 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 Daily headache   . Depression   . DVT (deep venous thrombosis), H/o 01/2014-on Xarelto 03/24/2014   LLE  . ESRD (end stage renal disease) on dialysis [facility])    "[place], MWF" (06/29/2016)  . Gait abnormality 12/22/2016  . Gastroparesis 12/22/2016  . Hypertension   . Hypothyroidism   . Nephrotic syndrome 05/18/2014  . Neuropathy   . Type 2 diabetes mellitus with diabetic nephropathy [facility])    Past Surgical History:  Past Surgical History:  Procedure Laterality Date  . ANKLE FRACTURE SURGERY Right 1988  . AV FISTULA PLACEMENT Left 01/01/2015   Procedure: CREATION OF LEFT RADIAL CEPHALIC ARTERIOVENOUS (AV) FISTULA ;  Surgeon: Pryor Ochoa, MD;  Location: [facility] OR;  Service: Vascular;  Laterality: Left;  . CAPD REMOVAL N/A 05/19/2016   Procedure: PD CATH REMOVAL;  Surgeon: Abigail Miyamoto,  MD;  Location: [facility] OR;  Service: General;  Laterality: N/A;  . CARDIAC CATHETERIZATION N/A 09/22/2015   Procedure: Left Heart Cath and Coronary Angiography;  Surgeon: Iran Ouch, MD;  Location: [facility] INVASIVE CV LAB;  Service: Cardiovascular;  Laterality: N/A;  . CORONARY ARTERY BYPASS GRAFT N/A  09/28/2015   Procedure: CORONARY ARTERY BYPASS GRAFTING (CABG) x 3 UTILIZING LEFT MAMMARY ARTERY AND ENDOSCOPICALLY HARVESTED LEFT GREATER SAPHENOUS VEIN.;  Surgeon: Delight Ovens, MD;  Location: [facility] OR;  Service: Open Heart Surgery;  Laterality: N/A;  . ENDOVEIN HARVEST OF GREATER SAPHENOUS VEIN Left 09/28/2015   Procedure: ENDOVEIN HARVEST OF GREATER SAPHENOUS VEIN;  Surgeon: Delight Ovens, MD;  Location[facility] OR;  Service: Open Heart Surgery;  Laterality: Left;  . ESOPHAGOGASTRODUODENOSCOPY N/A 03/29/2014   Procedure: ESOPHAGOGASTRODUODENOSCOPY (EGD);  Surgeon: Dorena Cookey, MD;  Location: [facility];  Service: Endoscopy;  Laterality: N/A;  . EYE SURGERY    . FRACTURE SURGERY    . INSERTION OF DIALYSIS CATHETER Right 01/05/2015   Procedure: INSERTION OF RIGHT INTERNAL JUGULAR DIALYSIS CATHETER;  Surgeon: Fransisco Hertz, MD;  Location: [facility] OR;  Service: Vascular;  Laterality: Right;  . LEFT HEART CATH AND CORS/GRAFTS ANGIOGRAPHY N/A 09/13/2016   Procedure: LEFT HEART CATH AND CORS/GRAFTS ANGIOGRAPHY;  Surgeon: Kathleene Hazel, MD;  Location: [facility] INVASIVE CV LAB;  Service: Cardiovascular;  Laterality: N/A;  . RETINAL LASER PROCEDURE Bilateral   . RIGHT/LEFT HEART CATH AND CORONARY/GRAFT ANGIOGRAPHY N/A 03/06/2017   Procedure: RIGHT/LEFT HEART CATH AND CORONARY/GRAFT ANGIOGRAPHY;  Surgeon: Marykay Lex, MD;  Location: [facility] INVASIVE CV LAB;  Service: Cardiovascular;  Laterality: N/A;  . TEE WITHOUT CARDIOVERSION N/A 09/28/2015   Procedure: TRANSESOPHAGEAL ECHOCARDIOGRAM (TEE);  Surgeon: Delight Ovens, MD;  Location: [facility] OR;  Service: Open Heart Surgery;  Laterality: N/A;  . TONSILLECTOMY AND ADENOIDECTOMY  1970s   Social History:  reports that he has never smoked. He has never used smokeless tobacco. He reports that he drinks alcohol. He reports that he does not use drugs.  Family / Support Systems Marital Status: Single Patient Roles: Partner, Other (Comment)(Child) Spouse/Significant  Other: Nadga Ford-girlfriend [phone] Other Supports: Patsy-Mom [phone]  Steve-Dad [phone] Anticipated Caregiver: Mom Ability/Limitations of Caregiver: Mom and girlfriend Caregiver Availability: 24/7 Family Dynamics: Parents are supportive and involved along wiht his girlfriend. He has friends who come by and check on him to see if he needs anything.  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 He has been able to overcome and deal with the challenges they create for him. He would like to find a pain meds that would work for him. He wants to get back to being able to do for himself Recent Psychosocial Issues: health issues Pyschiatric History: History of anxiety takes medications for this and finds them helpful. He would benefit from seeing neuro-psych while here, will make referral. Substance Abuse History: No issues  Patient / Family Perceptions, Expectations & Goals Pt/Family understanding of illness & functional limitations: Pt and parents have spoken with the MD and feel they have a good  understanding of his treatment going forward and plan. All are hopeful he will do well here and improve while here. Premorbid pt/family roles/activities: son, boyfriend, dialysis pt, friend, etc Anticipated changes in roles/activities/participation: resume Pt/family expectations/goals: Pt states: " I want to take care  of myself like I have been doing."  Mom states: " I hope he does well here, but will certainly help him if needed."  [employer]: Other (Comment)(HD-[id number]  ) Premorbid Home Care/DME Agencies: None Transportation available at discharge: Self, family and Midwife referrals recommended: Neuropsychology  Discharge Planning Living Arrangements: Parent Support Systems: Parent, Friends/neighbors Type of Residence: Private residence Insurance Resources: Electrical engineer Resources: [employer] Screen Referred: No Living Expenses: Lives with family Money Management: Family Does the patient have any problems obtaining your medications?: No Home Management: Parents Patient/Family Preliminary Plans: Return home with parents, Mom to assist if needed. She can be available. Pt hopes to be mod/i level and be able to take care of himself. He is doing well here. Social Work Anticipated Follow Up Needs: HH/OP, Support Group  Clinical Impression Pleasant gentleman who is motivated to do well and regain his independence before leaving rehab. He will have someone at home in case needed. But is doing well and will be short length of stay.  Lucy Chris 05/28/2017, 3:42 PM

## 2017-05-28 NOTE — Care Management Note (Signed)
Inpatient Rehabilitation Center Individual Statement of Services  Patient Name:  Russell [name]  Date:  05/28/2017  Welcome to the Inpatient Rehabilitation Center.  Our goal is to provide you with an individualized program based on your diagnosis and situation, designed to meet your specific needs.  With this comprehensive rehabilitation program, you will be expected to participate in at least 3 hours of rehabilitation therapies Monday-Friday, with modified therapy programming on the weekends.  Your rehabilitation program will include the following services:  Physical Therapy (PT), Occupational Therapy (OT), 24 hour per day rehabilitation nursing, Therapeutic Recreaction (TR), Neuropsychology, Case Management (Social Worker), Rehabilitation Medicine, Nutrition Services and Pharmacy Services  Weekly team conferences will be held on Wednesday to discuss your progress.  Your Social Worker will talk with you frequently to get your input and to update you on team discussions.  Team conferences with you and your family in attendance may also be held.  Expected length of stay: 6-9 days  Overall anticipated outcome: mod/i level  Depending on your progress and recovery, your program may change. Your Social Worker will coordinate services and will keep you informed of any changes. Your Social Worker's name and contact numbers are listed  below.  The following services may also be recommended but are not provided by the Inpatient Rehabilitation Center:   Driving Evaluations  Home Health Rehabiltiation Services  Outpatient Rehabilitation Services   Arrangements will be made to provide these services after discharge if needed.  Arrangements include referral to agencies that provide these services.  Your insurance has been verified to be:  Medicare & COmmerical Your primary doctor is:  Norberto Sorenson  Pertinent information will be shared with your doctor and your insurance company.  Social Worker:  Dossie Der, SW [phone] or [phone]  Information discussed with and copy given to patient by: Lucy Chris, 05/28/2017, 3:43 PM

## 2017-05-28 NOTE — Progress Notes (Signed)
Physical Therapy Session Note  Patient Details  Name: Russell [name] MRN: [id number] Date of Birth: [birth year]  Skilled Therapeutic Interventions/Progress Updates: Pt missed 60 min PT d/t off unit for dialysis. Discussed with RN and charge RN d/t conflict in dialysis time with therapy schedule; plan for future dialysis treatment MWF after noon, scheduling staff alerted. Therapist returned at 1:45pm to attempt seeing patient; RN administering medications and pt had not yet eaten lunch and declined participation at this time. Continue per POC as schedule allows.      Therapy Documentation Precautions:  Precautions Precautions: Fall Precaution Comments: impulsive General: PT Amount of Missed Time (min): 60 Minutes PT Missed Treatment Reason: Unavailable (Comment)(dialysis)   See Function Navigator for Current Functional Status.    Carolynn Comment Tygielski 05/28/2017, 3:29 PM

## 2017-05-28 NOTE — Progress Notes (Signed)
Subjective/Complaints: Pt seen in HD this AM.  He states he slept well overnight.  He denies complaints.  ROS- Denies for N/V/D, no CP or SOB  Objective: Vital Signs: Blood pressure (!) 106/59, pulse 65, temperature 97.6 F (36.4 C), temperature source Oral, resp. rate 16, height 6' 2"  (1.88 m), weight 123.2 kg (271 lb 9.7 oz), SpO2 100 %. Dg Chest Port 1 View  Result Date: 05/27/2017 CLINICAL DATA:  Shortness of breath. EXAM: PORTABLE CHEST 1 VIEW COMPARISON:  Radiograph of May 20, 2017. FINDINGS: Stable cardiomegaly. Status post coronary artery bypass graft. No pneumothorax or pleural effusion is noted. Both lungs are clear. The visualized skeletal structures are unremarkable. IMPRESSION: No acute cardiopulmonary abnormality seen. Electronically Signed   By: Marijo Conception, M.D.   On: 05/27/2017 10:02   Results for orders placed or performed during the hospital encounter of 05/25/17 (from the past 72 hour(s))  Renal function panel     Status: Abnormal   Collection Time: 05/28/17  7:10 AM  Result Value Ref Range   Sodium 133 (L) 135 - 145 mmol/L   Potassium 5.3 (H) 3.5 - 5.1 mmol/L   Chloride 92 (L) 101 - 111 mmol/L   CO2 26 22 - 32 mmol/L   Glucose, Bld 169 (H) 65 - 99 mg/dL   BUN 60 (H) 6 - 20 mg/dL   Creatinine, Ser 10.66 (H) 0.61 - 1.24 mg/dL   Calcium 9.4 8.9 - 10.3 mg/dL   Phosphorus 5.9 (H) 2.5 - 4.6 mg/dL   Albumin 3.3 (L) 3.5 - 5.0 g/dL   GFR calc non Af Amer 5 (L) >60 mL/min   GFR calc Af Amer 6 (L) >60 mL/min    Comment: (NOTE) The eGFR has been calculated using the CKD EPI equation. This calculation has not been validated in all clinical situations. eGFR's persistently <60 mL/min signify possible Chronic Kidney Disease.    Anion gap 15 5 - 15    Comment: Performed at [facility] [address] [place], [place] [address]  CBC     Status: Abnormal   Collection Time: 05/28/17  7:10 AM  Result Value Ref Range   WBC 5.3 4.0 - 10.5 K/uL   RBC 2.92 (L) 4.22 -  5.81 MIL/uL   Hemoglobin 9.1 (L) 13.0 - 17.0 g/dL   HCT 27.6 (L) 39.0 - 52.0 %   MCV 94.5 78.0 - 100.0 fL   MCH 31.2 26.0 - 34.0 pg   MCHC 33.0 30.0 - 36.0 g/dL   RDW 15.4 11.5 - 15.5 %   Platelets 210 150 - 400 K/uL    Comment: Performed at [facility], [facility] [address] [place], [place] [address]     HENT: Normocephalic, atraumatic. Eyes: EOMI. No discharge. Cardio: RRR and no JVD Resp: CTA B/L and unlabored GI: BS positive and ND Skin:   Intact. Warm and dry. Musc: No edema or tenderness in extremities Neuro: Alert and Oriented,  Motor 5/5 bilateral del bi tri, 4/5 grip 4+/5 bilateral HF, KE, 4-/5 ankle DF Gen NAD. Vital signs reviewed.  Assessment/Plan: 1. Functional deficits secondary to Debility due to acute renal failure which require 3+ hours per day of interdisciplinary therapy in a comprehensive inpatient rehab setting. Physiatrist is providing close team supervision and 24 hour management of active medical problems listed below. Physiatrist and rehab team continue to assess barriers to discharge/monitor patient progress toward functional and medical goals. FIM: Function - Bathing Position: Shower Body parts bathed by  patient: Right arm, Left arm, Chest, Abdomen, Front perineal area, Buttocks, Right upper leg, Left upper leg, Left lower leg, Right lower leg Bathing not applicable: Back(did not attempt) Assist Level: Supervision or verbal cues  Function- Upper Body Dressing/Undressing What is the patient wearing?: Pull over shirt/dress Pull over shirt/dress - Perfomed by patient: Thread/unthread right sleeve, Thread/unthread left sleeve, Put head through opening, Pull shirt over trunk Assist Level: Supervision or verbal cues Function - Lower Body Dressing/Undressing What is the patient wearing?: Pants, Shoes, Non-skid slipper socks Position: Other (comment)(standing next to the shower with use of the wall and grab bar ) Pants- Performed by patient:  Thread/unthread right pants leg, Thread/unthread left pants leg, Pull pants up/down Non-skid slipper socks- Performed by helper: Don/doff right sock, Don/doff left sock Shoes - Performed by patient: Don/doff right shoe, Don/doff left shoe        Function - Chair/bed transfer Chair/bed transfer method: Stand pivot Chair/bed transfer assist level: Supervision or verbal cues Chair/bed transfer details: Verbal cues for precautions/safety  Function - Locomotion: Wheelchair Will patient use wheelchair at discharge?: No Function - Locomotion: Ambulation Assistive device: Other (comment)(rollator) Max distance: 150' Assist level: Supervision or verbal cues Assist level: Touching or steadying assistance (Pt > 75%) Assist level: Touching or steadying assistance (Pt > 75%) Assist level: Touching or steadying assistance (Pt > 75%) Assist level: Touching or steadying assistance (Pt > 75%)  Function - Comprehension Comprehension: Auditory Comprehension assist level: Follows basic conversation/direction with extra time/assistive device  Function - Expression Expression: Verbal Expression assist level: Expresses basic needs/ideas: With no assist  Function - Social Interaction Social Interaction assist level: Interacts appropriately with others with medication or extra time (anti-anxiety, antidepressant).  Function - Problem Solving Problem solving assist level: Solves basic 90% of the time/requires cueing < 10% of the time  Function - Memory Memory assist level: Recognizes or recalls 90% of the time/requires cueing < 10% of the time Patient normally able to recall (first 3 days only): Current season, Location of own room, Staff names and faces, That he or she is in a hospital   Medical Problem List and Plan: 1.  Limitations with mobility, endurance, and self-care secondary to debility.  Cont CIR  2.  DVT Prophylaxis/Anticoagulation: Pharmaceutical: Heparin 3. Pain Management: Continue  hydrocodone and flexeril prn, trial Lyrica in place of gabapentin 4. Mood: LCSW to follow for evaluation and support.  5. Neuropsych: This patient is capable of making decisions on his own behalf. 6. Skin/Wound Care: routine pressure relief measures.  7. Fluids/Electrolytes/Nutrition: Monitor I/O. Renal/diabetic diet with 1200 cc FR/Day 8. T2DM with gastroparesis and peripheral neuropathy: Continue reglan bid for GI symptoms. Monitor BS ac/hs. On Lantus 15 units bid with SSI for elevated BS.  CBG (last 3)  Recent Labs    05/25/17 1317  GLUCAP 111*   Slightly labile, no recorded CBGs 9. CAD/HTN: Monitor BP bid--on metoprolol,  10.ESRD: Nephrology following  Monitor weight daily with strict I/O. Schedule HD at end of day to help with tolerance of activity. 11. SOB: Continue nebulizers prn. Encourage IS.   Improving 12. Anemia oc chronic disease  Hb 9.1 on 5/27  Cont to monitor  LOS (Days) 3 A FACE TO FACE EVALUATION WAS PERFORMED  Ankit Lorie Phenix 05/28/2017, 9:52 AM

## 2017-05-28 NOTE — Progress Notes (Signed)
[facility] Progress Note  Subjective: nausea is getting better  Vitals:   05/28/17 0930 05/28/17 1000 05/28/17 1030 05/28/17 1045  BP: (!) 106/59 117/70 101/65 (!) 89/48  Pulse: 65 73 64 67  Resp:      Temp:      TempSrc:      SpO2:      Weight:      Height:        Inpatient medications: . aspirin  81 mg Oral Daily  . atorvastatin  80 mg Oral q1800  . calcitRIOL  1.25 mcg Oral Q M,W,F-HD  . calcium acetate  2,001 mg Oral TID WC  . Chlorhexidine Gluconate Cloth  6 each Topical Q0600  . cinacalcet  30 mg Oral Q M,W,F  . [START ON 05/30/2017] darbepoetin (ARANESP) injection - DIALYSIS  100 mcg Intravenous Q Wed-HD  . DULoxetine  30 mg Oral Daily  . fluticasone  1 spray Each Nare Daily  . heparin  5,000 Units Subcutaneous Q8H  . insulin aspart  0-15 Units Subcutaneous TID WC  . insulin aspart  0-5 Units Subcutaneous QHS  . insulin glargine  15 Units Subcutaneous BID  . levothyroxine  50 mcg Oral QAC breakfast  . metoCLOPramide  5 mg Oral q12n4p  . metoprolol succinate  37.5 mg Oral BID  . pantoprazole  40 mg Oral BID AC  . pregabalin  75 mg Oral Daily   . sodium chloride    . sodium chloride     sodium chloride, sodium chloride, acetaminophen, albuterol, alteplase, aluminum hydroxide, bisacodyl, cyclobenzaprine, diphenhydrAMINE, guaiFENesin-dextromethorphan, heparin, HYDROcodone-acetaminophen, hydrocortisone cream, lidocaine (PF), lidocaine-prilocaine, pentafluoroprop-tetrafluoroeth, polyethylene glycol, prochlorperazine **OR** prochlorperazine **OR** prochlorperazine, senna-docusate, sorbitol, traZODone  Exam:  alert on hd no distress  no jvd  chest cta bilat  cor reg no mrg  abd soft ntnd obese  ext no edema  nf ox 3 lua avf+bruit  Dialysis: mwf ashe 4h  116kg  2/2.25  Hep none  - parsa 5 - venofer 50 /wk   - mircera 75 ug last 5/1 - calc 1.25 ug      Impression: 1 debility - on CIR now 2 diab gastroparesis - improved 3 esrd on hd  mwf 4 dyspnea w pulm edema - breathing better, still up 7-8kg   5 anemia ckd getting darbe 100 / wk here 6 dm2 7 volume - still up by wts stable resp status  Plan - dialysis today max Payton Spark MD [facility] pager [phone]   05/28/2017, 11:01 AM   Recent Labs  Lab 05/23/17 0828 05/25/17 0836 05/28/17 0710  NA 133* 129* 133*  K 5.2* 5.0 5.3*  CL 93* 90* 92*  CO2 GLUCOSE 186* 92 169*  BUN 54* 50* 60*  CREATININE 10.34* 9.18* 10.66*  CALCIUM 8.7* 9.2 9.4  PHOS  --  6.8* 5.9*   Recent Labs  Lab 05/25/17 0836 05/28/17 0710  ALBUMIN 3.6 3.3*   Recent Labs  Lab 05/22/17 0555 05/23/17 0828 05/25/17 0836 05/28/17 0710  WBC 8.1 7.2 8.1 5.3  NEUTROABS 5.2  --   --   --   HGB 9.6* 8.0* 9.4* 9.1*  HCT 30.3* 24.9* 29.1* 27.6*  MCV 95.9 94.0 94.5 94.5  PLT 241 281 258 210   Iron/TIBC/Ferritin/ %Sat    Component Value Date/Time   IRON 17 (L) 05/17/2016 0711   TIBC 216 (L) 05/17/2016 0711   FERRITIN 139 05/17/2016 0711   IRONPCTSAT 8 (L)  05/17/2016 0711     

## 2017-05-29 ENCOUNTER — Inpatient Hospital Stay (HOSPITAL_COMMUNITY): Payer: Medicare Other | Admitting: Occupational Therapy

## 2017-05-29 ENCOUNTER — Inpatient Hospital Stay (HOSPITAL_COMMUNITY): Payer: Medicare Other | Admitting: Physical Therapy

## 2017-05-29 ENCOUNTER — Inpatient Hospital Stay (HOSPITAL_COMMUNITY): Payer: Medicare Other

## 2017-05-29 ENCOUNTER — Ambulatory Visit: Payer: Medicare Other | Admitting: Physician Assistant

## 2017-05-29 DIAGNOSIS — H04123 Dry eye syndrome of bilateral lacrimal glands: Secondary | ICD-10-CM

## 2017-05-29 DIAGNOSIS — E1143 Type 2 diabetes mellitus with diabetic autonomic (poly)neuropathy: Secondary | ICD-10-CM

## 2017-05-29 DIAGNOSIS — M792 Neuralgia and neuritis, unspecified: Secondary | ICD-10-CM

## 2017-05-29 DIAGNOSIS — Z992 Dependence on renal dialysis: Secondary | ICD-10-CM

## 2017-05-29 DIAGNOSIS — R7309 Other abnormal glucose: Secondary | ICD-10-CM | POA: Insufficient documentation

## 2017-05-29 DIAGNOSIS — K3184 Gastroparesis: Secondary | ICD-10-CM

## 2017-05-29 DIAGNOSIS — I10 Essential (primary) hypertension: Secondary | ICD-10-CM

## 2017-05-29 DIAGNOSIS — N186 End stage renal disease: Secondary | ICD-10-CM

## 2017-05-29 LAB — GLUCOSE, CAPILLARY
GLUCOSE-CAPILLARY: 153 mg/dL — AB (ref 65–99)
GLUCOSE-CAPILLARY: 98 mg/dL (ref 65–99)
GLUCOSE-CAPILLARY: 100 mg/dL — AB (ref 65–99)
GLUCOSE-CAPILLARY: 105 mg/dL — AB (ref 65–99)
GLUCOSE-CAPILLARY: 170 mg/dL — AB (ref 65–99)
GLUCOSE-CAPILLARY: 190 mg/dL — AB (ref 65–99)
GLUCOSE-CAPILLARY: 136 mg/dL — AB (ref 65–99)
GLUCOSE-CAPILLARY: 109 mg/dL — AB (ref 65–99)
Glucose-Capillary: 186 mg/dL — ABNORMAL HIGH (ref 65–99)
Glucose-Capillary: 115 mg/dL — ABNORMAL HIGH (ref 65–99)
Glucose-Capillary: 138 mg/dL — ABNORMAL HIGH (ref 65–99)
Glucose-Capillary: 176 mg/dL — ABNORMAL HIGH (ref 65–99)
Glucose-Capillary: 162 mg/dL — ABNORMAL HIGH (ref 65–99)
Glucose-Capillary: 101 mg/dL — ABNORMAL HIGH (ref 65–99)
Glucose-Capillary: 145 mg/dL — ABNORMAL HIGH (ref 65–99)

## 2017-05-29 MED ORDER — CHLORHEXIDINE GLUCONATE CLOTH 2 % EX PADS: 6.0000 | MEDICATED_PAD | Freq: Every day | CUTANEOUS | Status: DC

## 2017-05-29 NOTE — Progress Notes (Signed)
Patient also refused his heparin shot & CHG bath. Provider was made aware.

## 2017-05-29 NOTE — Progress Notes (Signed)
Physical Therapy Session Note  Patient Details  Name: Russell [name] MRN: [id number] Date of Birth: Jan 02, 1971  Today's Date: 05/29/2017 PT Individual Time: 1000-1100 PT Individual Time Calculation (min): 60 min   Short Term Goals: Week 1:  PT Short Term Goal 1 (Week 1): =LTG due to estimated LOS  Skilled Therapeutic Interventions/Progress Updates: Pt received seated on EOB, c/o headache 7/10 pre-medicated and agreeable to treatment. Gait to gym [phone] with rollator and close S. Instructed pt in [place] A/B exercises for LE strengthening and balance each exercise 2x15 reps with exception of sit <>stand only able to perform 2 sets 5 reps, balance exercises with goal of 30 sec beginning with staggered stance as tandem is too difficult at this time. Educated pt on progression of exercises, safe performance at home; also discussed home health follow up PT to continue progressing towards goals. Standing gastroc stretch on 1" step x1 min. Standing heel/toe raises 2x15; unable to clear floor on toe raises and performed AROM dorsiflexion in sitting instead 2x15 reps. Educated pt on limited strength/ROM d/t neuropathy and disuse, implications on balance. Discussed diabetic foot care; pt reports he is currently wearing crocs because he is unable to tie tennis shoes d/t neuropathy, states he inspects skin daily. Provided pt with elastic shoe laces and ambulated back to room with rollator and S. Upon arrival to room, pt's tennis shoes noted to have shoe buttons on them already; pt then explains he was unable to tie them initially to setup button hook so he hasn't been able to use them. Therapist tied shoes for pt, double knotted, and asked pt to trial shoe buttons; R shoe button on lateral side of shoe, L shoe on medial side. Pt able to hook/unhook on medial side much easier; therapist switched R shoe button to inside of shoe. Pt to ask family to bring in socks. Pt remained seated on EOB with alarm intact, all needs in  reach at completion of care.      Therapy Documentation Precautions:  Precautions Precautions: Fall Precaution Comments: impulsive Restrictions Weight Bearing Restrictions: No   See Function Navigator for Current Functional Status.   Therapy/Group: Individual Therapy  [name] 05/29/2017, 12:17 PM

## 2017-05-29 NOTE — Progress Notes (Signed)
Physical Therapy Session Note  Patient Details  Name: Russell [name] MRN: [id number] Date of Birth: [birth year]  Today's Date: 05/29/2017 PT Individual Time: 1300-1330 PT Individual Time Calculation (min): 30 min   Short Term Goals: Week 1:  PT Short Term Goal 1 (Week 1): =LTG due to estimated LOS  Skilled Therapeutic Interventions/Progress Updates:   Pt sitting EOB and agreeable to therapy, denies pain. Requesting to work on fine motor tasks w/ UEs. Ambulated to/from therapy gym w/ supervision using rollator and increased time. Performed clothespin activity w/ yellow and red clothespins w/ emphasis on pinch grip. Increased difficulty w/ LUE, however successful w/ increased reps and attempts. Additionally performed pill bottle opening and closing w/ RUE, verbal cues for opening strategy. Returned to room and ended session sitting EOB, call bell within reach and all needs met. Bed alarm on.  Therapy Documentation Precautions:  Precautions Precautions: Fall Precaution Comments: impulsive Restrictions Weight Bearing Restrictions: No Vital Signs: Therapy Vitals Pulse Rate: 76 Resp: 20 BP: (!) 153/82 Patient Position (if appropriate): Lying Oxygen Therapy SpO2: 100 % O2 Device: Room Air Pain: Pain Assessment Pain Scale: 0-10 Pain Score: Asleep Pain Type: Chronic pain;Neuropathic pain Pain Location: Hand Pain Orientation: Right;Left Pain Descriptors / Indicators: Aching;Burning Pain Frequency: Intermittent Pain Onset: On-going Patients Stated Pain Goal: 4 Pain Intervention(s): Medication (See eMAR) Multiple Pain Sites: No  See Function Navigator for Current Functional Status.   Therapy/Group: Individual Therapy  Cristian Grieves K Arnette 05/29/2017, 2:37 PM

## 2017-05-29 NOTE — Progress Notes (Signed)
Call to [facility], [place] on call with pt BP readings, 187/101, 184/94, pt usually has lower b/p readings but they were trending up the past 24-48 hours.  No new orders at this time, but to recheck bp in one hour.

## 2017-05-29 NOTE — Progress Notes (Signed)
Occupational Therapy Session Note  Patient Details  Name: Russell [name] MRN: [id number] Date of Birth: 04/13/1970  Today's Date: 05/29/2017 OT Individual Time: 5913-6859 OT Individual Time Calculation (min): 60 min    Short Term Goals: Week 1:  OT Short Term Goal 1 (Week 1): STGs equal to LTGs based on current ELOS and set a modified independent level overall.   Skilled Therapeutic Interventions/Progress Updates:    Pt seen this session for a focus on B hand AROM and coordination. Pt received in room that he had showered and dressed last night with the supervision from nursing.  He had completed all self care.  Pt ambulated with rollator to gym where he worked on a variety of a/arom exercises for his hands. Emphasis on his weaker L thumb abd and flexion (thenar eminance atrophy noted) and intrinsics (intrinsic minus finger extension noted).  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 Worked on hand grasp with active wrist ulnar and radial flexion on lightweight dowel to simulate casting of a rod.  Stretching of B hands on large cups for improved web space to improve grasp and release strength.  Discussed work/ rest cycle to avoid over fatigue of muscles.  Pt has a home estim unit. Discussed working with his HHOT on facilitation of L thumb opposition (thenar eminance).  Pt ambulated back to room with all needs met.   Therapy Documentation Precautions:  Precautions Precautions: Fall Precaution Comments: impulsive Restrictions Weight Bearing Restrictions: No    Vital Signs: Therapy Vitals Temp: 97.8 F (36.6 C) Temp Source: Oral Pulse Rate: 79 Resp: 17 BP: (!) 141/88 Patient Position (if appropriate): Sitting Oxygen Therapy SpO2: 99 % O2 Device: Room Air Pain: Pain Assessment Pain Scale: 0-10 Pain Score: 9  Pain Location: Hand Pain Orientation: Right;Left Pain Descriptors / Indicators: Burning;Aching Patients Stated Pain Goal: 4 Pain Intervention(s): Medication (See  eMAR) ADL:   See Function Navigator for Current Functional Status.   Therapy/Group: Individual Therapy  [place] 05/29/2017, 8:25 AM

## 2017-05-29 NOTE — Progress Notes (Signed)
[facility]  Mr. Halls is an active Home Specialty Infusion patient with [facility] prior to this readmission.  [facility] Hospital Infusion Coordinator will follow and support DC to home when ordered to resume home infusion services/other services as needed/ordered.  If patient discharges after hours, please call [phone].   Sedalia Muta 05/29/2017, 2:36 PM

## 2017-05-29 NOTE — Progress Notes (Signed)
Recheck of bp, 144/84, pt asymptomatic with elevated bp's, only c/o pain that is "normal" for him.

## 2017-05-29 NOTE — Progress Notes (Signed)
Patient requested an order for artificial tears last night. On call provider was called & an order for lubricating tears was put in. This morning when administering, the tears were more of a gel & patient did not want it. Report given to the on-coming nurse & note left for the provider for a change in artificial tears. No distress noted.

## 2017-05-29 NOTE — Progress Notes (Signed)
Physical Therapy Session Note  Patient Details  Name: Russell [name] MRN: [id number] Date of Birth: July 28, 1970  Today's Date: 05/29/2017 PT Individual Time: 1430-1530 PT Individual Time Calculation (min): 60 min   Short Term Goals: Week 1:  PT Short Term Goal 1 (Week 1): =LTG due to estimated LOS  Skilled Therapeutic Interventions/Progress Updates:  Discussed pt goals - expressed desire to work on "his hands" and his overall strength. Focused on functional gait with rollator and without AD on unit with close supervision. Nustep for BUE and BLE strengthening and cardiovascular endurance on level 4 x 10 min. Fine motor tasks to manipulate clothespins with resistance (able to do up to red clothespins and a couple of blue) with R and L and pill bottle manipulation to fill and remove beads from pill box and pill bottles in various manners to address fine motor deficits while standing on foam for dynamic balance re-training. Biodex for NMR for postural control and balance retraining with limits of stability and random control on compliant level 9 surface. Pt declines attempting without UE support at this time but enjoyed activity and would like to practice again in future session.   Therapy Documentation Precautions:  Precautions Precautions: Fall Precaution Comments: impulsive Restrictions Weight Bearing Restrictions: No  Pain: C/o headache - premedicated.   See Function Navigator for Current Functional Status.   Therapy/Group: Individual Therapy  Karolee Stamps Darrol Poke, PT, DPT  05/29/2017, 3:32 PM

## 2017-05-29 NOTE — Progress Notes (Signed)
Subjective/Complaints: Pt seen sitting up at the EOB this AM AM.  He states he slept well overnight.  He requests artifical tears.  ROS: Denies N/V/D, CP or SOB  Objective: Vital Signs: Blood pressure (!) 141/88, pulse 79, temperature 97.8 F (36.6 C), temperature source Oral, resp. rate 17, height _0  (1.88 m), weight 119.7 kg (263 lb 14.3 oz), SpO2 99 %. Dg Chest Port 1 View  Result Date: 05/27/2017 CLINICAL DATA:  Shortness of breath. EXAM: PORTABLE CHEST 1 VIEW COMPARISON:  Radiograph of May 20, 2017. FINDINGS: Stable cardiomegaly. Status post coronary artery bypass graft. No pneumothorax or pleural effusion is noted. Both lungs are clear. The visualized skeletal structures are unremarkable. IMPRESSION: No acute cardiopulmonary abnormality seen. Electronically Signed   By: Marijo Conception, M.D.   On: 05/27/2017 10:02   Results for orders placed or performed during the hospital encounter of 05/25/17 (from the past 72 hour(s))  Renal function panel     Status: Abnormal   Collection Time: 05/28/17  7:10 AM  Result Value Ref Range   Sodium 133 (L) 135 - 145 mmol/L   Potassium 5.3 (H) 3.5 - 5.1 mmol/L   Chloride 92 (L) 101 - 111 mmol/L   CO2 26 22 - 32 mmol/L   Glucose, Bld 169 (H) 65 - 99 mg/dL   BUN 60 (H) 6 - 20 mg/dL   Creatinine, Ser 10.66 (H) 0.61 - 1.24 mg/dL   Calcium 9.4 8.9 - 10.3 mg/dL   Phosphorus 5.9 (H) 2.5 - 4.6 mg/dL   Albumin 3.3 (L) 3.5 - 5.0 g/dL   GFR calc non Af Amer 5 (L) >60 mL/min   GFR calc Af Amer 6 (L) >60 mL/min    Comment: (NOTE) The eGFR has been calculated using the CKD EPI equation. This calculation has not been validated in all clinical situations. eGFR's persistently <60 mL/min signify possible Chronic Kidney Disease.    Anion gap 15 5 - 15    Comment: Performed at [facility] [address], [place], [place] [address]  CBC     Status: Abnormal   Collection Time: 05/28/17  7:10 AM  Result Value Ref Range   WBC 5.3 4.0 - 10.5 K/uL    RBC 2.92 (L) 4.22 - 5.81 MIL/uL   Hemoglobin 9.1 (L) 13.0 - 17.0 g/dL   HCT 27.6 (L) 39.0 - 52.0 %   MCV 94.5 78.0 - 100.0 fL   MCH 31.2 26.0 - 34.0 pg   MCHC 33.0 30.0 - 36.0 g/dL   RDW 15.4 11.5 - 15.5 %   Platelets 210 150 - 400 K/uL    Comment: Performed at [facility], [place] [address], [place], [place] [address]  Glucose, capillary     Status: Abnormal   Collection Time: 05/28/17 12:32 PM  Result Value Ref Range   Glucose-Capillary 148 (H) 65 - 99 mg/dL  Glucose, capillary     Status: Abnormal   Collection Time: 05/28/17  4:21 PM  Result Value Ref Range   Glucose-Capillary 203 (H) 65 - 99 mg/dL  Glucose, capillary     Status: Abnormal   Collection Time: 05/28/17  9:39 PM  Result Value Ref Range   Glucose-Capillary 145 (H) 65 - 99 mg/dL  Glucose, capillary     Status: Abnormal   Collection Time: 05/29/17  6:32 AM  Result Value Ref Range   Glucose-Capillary 136 (H) 65 - 99 mg/dL     HENT: Normocephalic, atraumatic. Eyes: EOMI. No  discharge. Cardio: RRR and no JVD Resp: CTA B/L and unlabored GI: BS positive and ND Skin:   Intact. Warm and dry. Musc: No edema or tenderness in extremities Neuro: Alert and Oriented,  Motor 5/5 bilateral del bi tri, 4/5 grip 4+/5 bilateral HF, KE, 4-/5 ankle DF (stable) Gen NAD. Vital signs reviewed.  Assessment/Plan: 1. Functional deficits secondary to Debility due to acute renal failure which require 3+ hours per day of interdisciplinary therapy in a comprehensive inpatient rehab setting. Physiatrist is providing close team supervision and 24 hour management of active medical problems listed below. Physiatrist and rehab team continue to assess barriers to discharge/monitor patient progress toward functional and medical goals. FIM: Function - Bathing Position: Shower Body parts bathed by patient: Right arm, Left arm, Chest, Abdomen, Front perineal area, Buttocks, Right upper leg, Left upper leg, Left lower leg, Right lower  leg Bathing not applicable: Back(did not attempt) Assist Level: Supervision or verbal cues  Function- Upper Body Dressing/Undressing What is the patient wearing?: Pull over shirt/dress Pull over shirt/dress - Perfomed by patient: Thread/unthread right sleeve, Thread/unthread left sleeve, Put head through opening, Pull shirt over trunk Assist Level: Supervision or verbal cues Function - Lower Body Dressing/Undressing What is the patient wearing?: Pants, Shoes, Non-skid slipper socks Position: Other (comment)(standing next to the shower with use of the wall and grab bar ) Pants- Performed by patient: Thread/unthread right pants leg, Thread/unthread left pants leg, Pull pants up/down Non-skid slipper socks- Performed by helper: Don/doff right sock, Don/doff left sock Shoes - Performed by patient: Don/doff right shoe, Don/doff left shoe  Function - Toileting Toileting steps completed by patient: Adjust clothing prior to toileting, Performs perineal hygiene, Adjust clothing after toileting Toileting Assistive Devices: Grab bar or rail Assist level: More than reasonable time  Function - Toilet Transfers Assist level to toilet: Supervision or verbal cues Assist level from toilet: Supervision or verbal cues  Function - Chair/bed transfer Chair/bed transfer method: Stand pivot Chair/bed transfer assist level: Supervision or verbal cues Chair/bed transfer details: Verbal cues for precautions/safety  Function - Locomotion: Wheelchair Will patient use wheelchair at discharge?: No Function - Locomotion: Ambulation Assistive device: Other (comment)(rollator) Max distance: 150' Assist level: Supervision or verbal cues Assist level: Touching or steadying assistance (Pt > 75%) Assist level: Touching or steadying assistance (Pt > 75%) Assist level: Touching or steadying assistance (Pt > 75%) Assist level: Touching or steadying assistance (Pt > 75%)  Function - Comprehension Comprehension:  Auditory Comprehension assist level: Follows basic conversation/direction with extra time/assistive device  Function - Expression Expression: Verbal Expression assist level: Expresses basic needs/ideas: With no assist  Function - Social Interaction Social Interaction assist level: Interacts appropriately with others with medication or extra time (anti-anxiety, antidepressant).  Function - Problem Solving Problem solving assist level: Solves basic 90% of the time/requires cueing < 10% of the time  Function - Memory Memory assist level: Recognizes or recalls 90% of the time/requires cueing < 10% of the time Patient normally able to recall (first 3 days only): Current season, Location of own room, Staff names and faces, That he or she is in a hospital   Medical Problem List and Plan: 1.  Limitations with mobility, endurance, and self-care secondary to debility.  Cont CIR  2.  DVT Prophylaxis/Anticoagulation: Pharmaceutical: Heparin 3. Pain Management: Continue hydrocodone and flexeril prn, Lyrica in place of gabapentin 4. Mood: LCSW to follow for evaluation and support.  5. Neuropsych: This patient is capable of making decisions on his own  behalf. 6. Skin/Wound Care: routine pressure relief measures.  7. Fluids/Electrolytes/Nutrition: Monitor I/O. Renal/diabetic diet with 1200 cc FR/Day 8. T2DM with gastroparesis and peripheral neuropathy: Continue reglan bid for GI symptoms. Monitor BS ac/hs. On Lantus 15 units bid with SSI for elevated BS.  CBG (last 3)  Recent Labs    05/28/17 1621 05/28/17 2139 05/29/17 0632  GLUCAP 203* 145* 136*   Slightly labile on 5/28 9. CAD/HTN: Monitor BP bid--on metoprolol   ?Trending up on 5/28  10.ESRD: Nephrology following  Monitor weight daily with strict I/O. Schedule HD at end of day to help with tolerance of activity. 11. SOB: Continue nebulizers prn. Encourage IS.   Improving 12. Anemia oc chronic disease  Hb 9.1 on 5/27  Cont to  monitor 13. Dry eyes  Artificial tears ordered on 5/28  LOS (Days) 4 A FACE TO FACE EVALUATION WAS PERFORMED  Ector Laurel Lorie Phenix 05/29/2017, 9:29 AM

## 2017-05-29 NOTE — Progress Notes (Signed)
[facility] Progress Note  Subjective: working in gym  Vitals:   05/28/17 1128 05/28/17 1311 05/28/17 2029 05/29/17 0637  BP: (!) 100/54 126/79 (!) 143/84 (!) 141/88  Pulse: 67 68 83 79  Resp: Temp: 97.8 F (36.6 C) 97.6 F (36.4 C) 98.2 F (36.8 C) 97.8 F (36.6 C)  TempSrc: Oral Oral Oral Oral  SpO2: 100% 100% 100% 99%  Weight: 118.7 kg (261 lb 11 oz)   119.7 kg (263 lb 14.3 oz)  Height:        Inpatient medications: . aspirin  81 mg Oral Daily  . atorvastatin  80 mg Oral q1800  . calcitRIOL  1.25 mcg Oral Q M,W,F-HD  . calcium acetate  2,001 mg Oral TID WC  . Chlorhexidine Gluconate Cloth  6 each Topical Q0600  . cinacalcet  30 mg Oral Q M,W,F  . [START ON 05/30/2017] darbepoetin (ARANESP) injection - DIALYSIS  100 mcg Intravenous Q Wed-HD  . DULoxetine  30 mg Oral Daily  . fluticasone  1 spray Each Nare Daily  . heparin  5,000 Units Subcutaneous Q8H  . insulin aspart  0-15 Units Subcutaneous TID WC  . insulin aspart  0-5 Units Subcutaneous QHS  . insulin glargine  15 Units Subcutaneous BID  . levothyroxine  50 mcg Oral QAC breakfast  . metoCLOPramide  5 mg Oral q12n4p  . metoprolol succinate  37.5 mg Oral BID  . pantoprazole  40 mg Oral BID AC  . pregabalin  75 mg Oral Daily    acetaminophen, albuterol, aluminum hydroxide, bisacodyl, cyclobenzaprine, diphenhydrAMINE, guaiFENesin-dextromethorphan, HYDROcodone-acetaminophen, hydrocortisone cream, hydroxypropyl methylcellulose / hypromellose, polyethylene glycol, prochlorperazine **OR** prochlorperazine **OR** prochlorperazine, senna-docusate, sorbitol, traZODone  Exam:  alert on hd no distress  no jvd  chest cta bilat  cor reg no mrg  abd soft ntnd obese  ext no edema  nf ox 3 lua avf+bruit  Dialysis: [facility]wf ashe 4h  116kg  2/2.25  Hep none  - parsa 5 - venofer 50 /wk   - mircera 75 ug last 5/1 - calc 1.25 ug      Impression: 1 debility - on CIR now 2 sp diab  gastroparesis 3 esrd on hd mwf 4 dyspnea/ pulm edema/ vol ^ -  breathing better, up 3-4kg now 5 anemia ckd getting darbe 100 / wk here 6 dm2  Plan - dialysis wed max UF to dry wt   Russell Moselle MD [facility] pager [phone]   05/29/2017, 11:34 AM   Recent Labs  Lab 05/23/17 0828 05/25/17 0836 05/28/17 0710  NA 133* 129* 133*  K 5.2* 5.0 5.3*  CL 93* 90* 92*  CO2 GLUCOSE 186* 92 169*  BUN 54* 50* 60*  CREATININE 10.34* 9.18* 10.66*  CALCIUM 8.7* 9.2 9.4  PHOS  --  6.8* 5.9*   Recent Labs  Lab 05/25/17 0836 05/28/17 0710  ALBUMIN 3.6 3.3*   Recent Labs  Lab 05/23/17 0828 05/25/17 0836 05/28/17 0710  WBC 7.2 8.1 5.3  HGB 8.0* 9.4* 9.1*  HCT 24.9* 29.1* 27.6*  MCV 94.0 94.5 94.5  PLT 281 258 210   Iron/TIBC/Ferritin/ %Sat    Component Value Date/Time   IRON 17 (L) 05/17/2016 0711   TIBC 216 (L) 05/17/2016 0711   FERRITIN 139 05/17/2016 0711   IRONPCTSAT 8 (L) 05/17/2016 1478

## 2017-05-29 NOTE — Plan of Care (Signed)
  Problem: Consults Goal: RH GENERAL PATIENT EDUCATION Description See Patient Education module for education specifics. Outcome: Progressing Goal: Skin Care Protocol Initiated - if Braden Score 18 or less Description If consults are not indicated, leave blank or document N/A Outcome: Progressing Goal: Nutrition Consult-if indicated Outcome: Progressing Goal: Diabetes Guidelines if Diabetic/Glucose > 140 Description If diabetic or lab glucose is > 140 mg/dl - Initiate Diabetes/Hyperglycemia Guidelines & Document Interventions  Outcome: Progressing   Problem: RH BOWEL ELIMINATION Goal: RH STG MANAGE BOWEL WITH ASSISTANCE Description STG Manage Bowel with  Mod I Assistance.  Outcome: Progressing Goal: RH STG MANAGE BOWEL W/MEDICATION W/ASSISTANCE Description STG Manage Bowel with Medication with mod I  Assistance.  Outcome: Progressing   Problem: RH SKIN INTEGRITY Goal: RH STG SKIN FREE OF INFECTION/BREAKDOWN Description Maintain skin integrity with min assist  Outcome: Progressing Goal: RH STG MAINTAIN SKIN INTEGRITY WITH ASSISTANCE Description STG Maintain Skin Integrity With  Min Assistance.  Outcome: Progressing   Problem: RH SAFETY Goal: RH STG ADHERE TO SAFETY PRECAUTIONS W/ASSISTANCE/DEVICE Description STG Adhere to Safety Precautions With cues/resources Assistance/Device.  Outcome: Progressing Goal: RH STG DECREASED RISK OF FALL WITH ASSISTANCE Description STG Decreased Risk of Fall With  Cues/reminders Assistance.  Outcome: Progressing   Problem: RH KNOWLEDGE DEFICIT GENERAL Goal: RH STG INCREASE KNOWLEDGE OF SELF CARE AFTER HOSPITALIZATION Description Pt will be able to explain care at home and direct caregivers independently  Outcome: Progressing   Problem: RH Other (Specify) Goal: RH LTG Other (Specify)1 Outcome: Progressing   Problem: RH PAIN MANAGEMENT Goal: RH STG PAIN MANAGED AT OR BELOW PT'S PAIN GOAL Description At or below level 5  Outcome:  Not Progressing  Pt c/o pain greater than 5 with medication

## 2017-05-30 ENCOUNTER — Inpatient Hospital Stay (HOSPITAL_COMMUNITY): Payer: Medicare Other | Admitting: Occupational Therapy

## 2017-05-30 ENCOUNTER — Inpatient Hospital Stay (HOSPITAL_COMMUNITY): Payer: Medicare Other | Admitting: Physical Therapy

## 2017-05-30 LAB — CBC WITH DIFFERENTIAL/PLATELET
ABS IMMATURE GRANULOCYTES: 0 10*3/uL (ref 0.0–0.1)
Basophils Absolute: 0 10*3/uL (ref 0.0–0.1)
Basophils Relative: 1 %
Eosinophils Absolute: 0.4 10*3/uL (ref 0.0–0.7)
Eosinophils Relative: 6 %
HEMATOCRIT: 27.6 % — AB (ref 39.0–52.0)
HEMOGLOBIN: 8.9 g/dL — AB (ref 13.0–17.0)
IMMATURE GRANULOCYTES: 0 %
LYMPHS ABS: 0.8 10*3/uL (ref 0.7–4.0)
LYMPHS PCT: 14 %
MCH: 29.9 pg (ref 26.0–34.0)
MCHC: 32.2 g/dL (ref 30.0–36.0)
MCV: 92.6 fL (ref 78.0–100.0)
MONOS PCT: 10 %
Monocytes Absolute: 0.6 10*3/uL (ref 0.1–1.0)
NEUTROS ABS: 4.1 10*3/uL (ref 1.7–7.7)
NEUTROS PCT: 69 %
PLATELETS: 220 10*3/uL (ref 150–400)
RBC: 2.98 MIL/uL — ABNORMAL LOW (ref 4.22–5.81)
RDW: 15.2 % (ref 11.5–15.5)
WBC: 5.9 10*3/uL (ref 4.0–10.5)

## 2017-05-30 LAB — GLUCOSE, CAPILLARY
GLUCOSE-CAPILLARY: 122 mg/dL — AB (ref 65–99)
GLUCOSE-CAPILLARY: 201 mg/dL — AB (ref 65–99)
Glucose-Capillary: 70 mg/dL (ref 65–99)
Glucose-Capillary: 145 mg/dL — ABNORMAL HIGH (ref 65–99)

## 2017-05-30 LAB — RENAL FUNCTION PANEL
ANION GAP: 12 (ref 5–15)
Albumin: 3.4 g/dL — ABNORMAL LOW (ref 3.5–5.0)
BUN: 60 mg/dL — ABNORMAL HIGH (ref 6–20)
CO2: 25 mmol/L (ref 22–32)
CREATININE: 10.15 mg/dL — AB (ref 0.61–1.24)
Calcium: 9.4 mg/dL (ref 8.9–10.3)
Chloride: 96 mmol/L — ABNORMAL LOW (ref 101–111)
GFR, EST AFRICAN AMERICAN: 6 mL/min — AB (ref >60)
GFR, EST NON AFRICAN AMERICAN: 5 mL/min — AB (ref >60)
Glucose, Bld: 126 mg/dL — ABNORMAL HIGH (ref 65–99)
Phosphorus: 5 mg/dL — ABNORMAL HIGH (ref 2.5–4.6)
Potassium: 5.1 mmol/L (ref 3.5–5.1)
Sodium: 133 mmol/L — ABNORMAL LOW (ref 135–145)

## 2017-05-30 MED ORDER — DARBEPOETIN ALFA 100 MCG/0.5ML IJ SOSY
PREFILLED_SYRINGE | INTRAMUSCULAR | Status: AC
  Administered 2017-05-30: 100 ug
  Filled 2017-05-30: qty 0.5

## 2017-05-30 NOTE — Progress Notes (Signed)
Physical Therapy Session Note  Patient Details  Name: Russell [name] MRN: [id number] Date of Birth: Jan 22, 1970  Today's Date: 05/30/2017 PT Individual Time: 0803-0830 PT Individual Time Calculation (min): 27 min   Short Term Goals: Week 1:  PT Short Term Goal 1 (Week 1): =LTG due to estimated LOS  Skilled Therapeutic Interventions/Progress Updates:   Pt received supine in bed and agreeable to PT. Supine>sit transfer without assist or cues.   Gait training without AD x 2 x 181f. Supervision assist with min cues around obstacles in the floor and for safety around turns.    Dynamic balance training to weave through 5 cones x 6 with supervision assist. Stepping over obstacles in floor 1inch to 6 inch 4 x 4 obstacles. Supervision assist from PT and moderate cues for gait pattern to prevent LOB with change in gait speed prior to obstacles.   Patient returned to room and left sitting EOB with call bell in reach and all needs met.          Therapy Documentation Precautions:  Precautions Precautions: Fall Precaution Comments: impulsive Restrictions Weight Bearing Restrictions: No Vital Signs: Therapy Vitals Temp: 97.6 F (36.4 C) Temp Source: Oral Pulse Rate: 79 Resp: 18 BP: (!) 141/82 Patient Position (if appropriate): Lying Oxygen Therapy SpO2: 100 % O2 Device: Room Air   See Function Navigator for Current Functional Status.   Therapy/Group: Individual Therapy  ALorie Phenix5/29/2019, 8:27 AM

## 2017-05-30 NOTE — Progress Notes (Signed)
Occupational Therapy Session Note  Patient Details  Name: Russell [name] MRN: [id number] Date of Birth: 1970/02/15  Today's Date: 05/30/2017 OT Individual Time: 0901-1002 OT Individual Time Calculation (min): 61 min    Short Term Goals: Week 1:  OT Short Term Goal 1 (Week 1): STGs equal to LTGs based on current ELOS and set a modified independent level overall.   Skilled Therapeutic Interventions/Progress Updates:    Pt ambulated with supervision down to the therapy gym from his room.  Occasional staggering and exaggerated gait noted.  Once in the gym focused session on UE strengthening and coordination.  Had him begin in quadriped weightbearing over his UEs while working on washing the mat, alternating UEs.  He would demonstrate increased fatigue and move into tall kneeling to rest.  Next had pt work on catching and tossing a beach ball.  Had him catch it and then bring it overhead to toss back.  He completed 10 repetitions.  Therapist then added 1 lb wrist weights to each UE for the next set.  Completed final 2 sets with use of a basketball instead of the beach ball, with pt demonstrating much more fatigue.  Progressed to FM task of using light resistive tongs to pick up one inch blocks, alternating UEs using gross grasp.  Finished session with ambulation to the dayroom with supervision and pt needing rest break for 2 mins and then ambulating back to the room.  Pt left sitting EOB with call button and phone in reach.  Bed alarm in place.    Therapy Documentation Precautions:  Precautions Precautions: Fall Precaution Comments: impulsive Restrictions Weight Bearing Restrictions: No  Pain: Pain Assessment Pain Scale: 0-10 Pain Score: 9  Pain Type: Neuropathic pain Pain Location: Hand Pain Orientation: Right;Left Pain Descriptors / Indicators: Tingling;Pins and needles Pain Frequency: Constant Pain Onset: On-going Patients Stated Pain Goal: 7 Pain Intervention(s): Medication (See  eMAR) ADL: See Function Navigator for Current Functional Status.   Therapy/Group: Individual Therapy  Sindi Beckworth OTR/L 05/30/2017, 12:26 PM

## 2017-05-30 NOTE — Plan of Care (Signed)
  Problem: Consults Goal: RH GENERAL PATIENT EDUCATION Description See Patient Education module for education specifics. Outcome: Progressing Goal: Skin Care Protocol Initiated - if Braden Score 18 or less Description If consults are not indicated, leave blank or document N/A Outcome: Progressing Goal: Nutrition Consult-if indicated Outcome: Progressing Goal: Diabetes Guidelines if Diabetic/Glucose > 140 Description If diabetic or lab glucose is > 140 mg/dl - Initiate Diabetes/Hyperglycemia Guidelines & Document Interventions  Outcome: Progressing   Problem: RH BOWEL ELIMINATION Goal: RH STG MANAGE BOWEL WITH ASSISTANCE Description STG Manage Bowel with  Mod I Assistance.  Outcome: Progressing Goal: RH STG MANAGE BOWEL W/MEDICATION W/ASSISTANCE Description STG Manage Bowel with Medication with mod I  Assistance.  Outcome: Progressing   Problem: RH SKIN INTEGRITY Goal: RH STG SKIN FREE OF INFECTION/BREAKDOWN Description Maintain skin integrity with min assist  Outcome: Progressing Goal: RH STG MAINTAIN SKIN INTEGRITY WITH ASSISTANCE Description STG Maintain Skin Integrity With  Min Assistance.  Outcome: Progressing   Problem: RH SAFETY Goal: RH STG ADHERE TO SAFETY PRECAUTIONS W/ASSISTANCE/DEVICE Description STG Adhere to Safety Precautions With cues/resources Assistance/Device.  Outcome: Progressing Goal: RH STG DECREASED RISK OF FALL WITH ASSISTANCE Description STG Decreased Risk of Fall With  Cues/reminders Assistance.  Outcome: Progressing   Problem: RH KNOWLEDGE DEFICIT GENERAL Goal: RH STG INCREASE KNOWLEDGE OF SELF CARE AFTER HOSPITALIZATION Description Pt will be able to explain care at home and direct caregivers independently  Outcome: Progressing   Problem: RH PAIN MANAGEMENT Goal: RH STG PAIN MANAGED AT OR BELOW PT'S PAIN GOAL Description At or below level 5  Outcome: Not Progressing Pt has been given PRN meds when due. Pt c/o pain level 9 of 10 pain  to hands and feet d/t neuropathy.

## 2017-05-30 NOTE — Progress Notes (Signed)
Pt transported to Hemodialysis via bed. VSS. wil f/up when return  K Izaih Kataoka LPN

## 2017-05-30 NOTE — Progress Notes (Signed)
Subjective/Complaints: Patient seen lying in bed this morning. He states he did not sleep well overnight due to headache, but that improved with medications.  ROS: denies N/V/D, CP or SOB  Objective: Vital Signs: Blood pressure (!) 141/82, pulse 79, temperature 97.6 F (36.4 C), temperature source Oral, resp. rate 18, height 6' 2"  (1.88 m), weight 123.6 kg (272 lb 7.8 oz), SpO2 100 %. No results found. Results for orders placed or performed during the hospital encounter of 05/25/17 (from the past 72 hour(s))  Glucose, capillary     Status: Abnormal   Collection Time: 05/27/17 11:36 AM  Result Value Ref Range   Glucose-Capillary 105 (H) 65 - 99 mg/dL  Glucose, capillary     Status: Abnormal   Collection Time: 05/27/17  4:38 PM  Result Value Ref Range   Glucose-Capillary 170 (H) 65 - 99 mg/dL  Glucose, capillary     Status: Abnormal   Collection Time: 05/27/17  9:32 PM  Result Value Ref Range   Glucose-Capillary 190 (H) 65 - 99 mg/dL  Glucose, capillary     Status: Abnormal   Collection Time: 05/28/17  6:35 AM  Result Value Ref Range   Glucose-Capillary 162 (H) 65 - 99 mg/dL  Renal function panel     Status: Abnormal   Collection Time: 05/28/17  7:10 AM  Result Value Ref Range   Sodium 133 (L) 135 - 145 mmol/L   Potassium 5.3 (H) 3.5 - 5.1 mmol/L   Chloride 92 (L) 101 - 111 mmol/L   CO2 26 22 - 32 mmol/L   Glucose, Bld 169 (H) 65 - 99 mg/dL   BUN 60 (H) 6 - 20 mg/dL   Creatinine, Ser 10.66 (H) 0.61 - 1.24 mg/dL   Calcium 9.4 8.9 - 10.3 mg/dL   Phosphorus 5.9 (H) 2.5 - 4.6 mg/dL   Albumin 3.3 (L) 3.5 - 5.0 g/dL   GFR calc non Af Amer 5 (L) >60 mL/min   GFR calc Af Amer 6 (L) >60 mL/min    Comment: (NOTE) The eGFR has been calculated using the CKD EPI equation. This calculation has not been validated in all clinical situations. eGFR's persistently <60 mL/min signify possible Chronic Kidney Disease.    Anion gap 15 5 - 15    Comment: Performed at [facility] [address], [place], [place] [address]  CBC     Status: Abnormal   Collection Time: 05/28/17  7:10 AM  Result Value Ref Range   WBC 5.3 4.0 - 10.5 K/uL   RBC 2.92 (L) 4.22 - 5.81 MIL/uL   Hemoglobin 9.1 (L) 13.0 - 17.0 g/dL   HCT 27.6 (L) 39.0 - 52.0 %   MCV 94.5 78.0 - 100.0 fL   MCH 31.2 26.0 - 34.0 pg   MCHC 33.0 30.0 - 36.0 g/dL   RDW 15.4 11.5 - 15.5 %   Platelets 210 150 - 400 K/uL    Comment: Performed at [facility], [place] [address], [place], [place] [address]  Glucose, capillary     Status: Abnormal   Collection Time: 05/28/17 12:32 PM  Result Value Ref Range   Glucose-Capillary 148 (H) 65 - 99 mg/dL  Glucose, capillary     Status: Abnormal   Collection Time: 05/28/17  4:21 PM  Result Value Ref Range   Glucose-Capillary 203 (H) 65 - 99 mg/dL  Glucose, capillary     Status: Abnormal   Collection Time: 05/28/17  9:39 PM  Result Value Ref  Range   Glucose-Capillary 145 (H) 65 - 99 mg/dL  Glucose, capillary     Status: Abnormal   Collection Time: 05/29/17  6:32 AM  Result Value Ref Range   Glucose-Capillary 136 (H) 65 - 99 mg/dL  Glucose, capillary     Status: Abnormal   Collection Time: 05/29/17 11:44 AM  Result Value Ref Range   Glucose-Capillary 109 (H) 65 - 99 mg/dL  Glucose, capillary     Status: Abnormal   Collection Time: 05/29/17  4:18 PM  Result Value Ref Range   Glucose-Capillary 101 (H) 65 - 99 mg/dL  Glucose, capillary     Status: Abnormal   Collection Time: 05/29/17 10:11 PM  Result Value Ref Range   Glucose-Capillary 145 (H) 65 - 99 mg/dL  Glucose, capillary     Status: None   Collection Time: 05/30/17  6:39 AM  Result Value Ref Range   Glucose-Capillary 70 65 - 99 mg/dL    Gen NAD. Vital signs reviewed. HENT: Normocephalic, atraumatic. Eyes: EOMI. No discharge. Cardio: RRR and no JVD Resp: CTA B/L and unlabored GI: BS positive and ND Musc: No edema or tenderness in extremities Neuro: Alert and Oriented,  Motor 5/5 bilateral del bi tri,  4/5 grip RLE: 4+/5 HF, KE, 4/5 ankle DF LLE: 4+/5 bilateral HF, KE, 2/5 ankle DF Skin: Warm and dry. Intact.   Assessment/Plan: 1. Functional deficits secondary to Debility due to acute renal failure which require 3+ hours per day of interdisciplinary therapy in a comprehensive inpatient rehab setting. Physiatrist is providing close team supervision and 24 hour management of active medical problems listed below. Physiatrist and rehab team continue to assess barriers to discharge/monitor patient progress toward functional and medical goals. FIM: Function - Bathing Position: Shower Body parts bathed by patient: Right arm, Left arm, Chest, Abdomen, Front perineal area, Buttocks, Right upper leg, Left upper leg, Left lower leg, Right lower leg Bathing not applicable: Back(did not attempt) Assist Level: Supervision or verbal cues  Function- Upper Body Dressing/Undressing What is the patient wearing?: Pull over shirt/dress Pull over shirt/dress - Perfomed by patient: Thread/unthread right sleeve, Thread/unthread left sleeve, Put head through opening, Pull shirt over trunk Assist Level: Supervision or verbal cues Function - Lower Body Dressing/Undressing What is the patient wearing?: Pants, Shoes, Non-skid slipper socks Position: Other (comment)(standing next to the shower with use of the wall and grab bar ) Pants- Performed by patient: Thread/unthread right pants leg, Thread/unthread left pants leg, Pull pants up/down Non-skid slipper socks- Performed by helper: Don/doff right sock, Don/doff left sock Shoes - Performed by patient: Don/doff right shoe, Don/doff left shoe Assist for lower body dressing: Touching or steadying assistance (Pt > 75%)(per OT note)  Function - Toileting Toileting steps completed by patient: Adjust clothing prior to toileting, Performs perineal hygiene, Adjust clothing after toileting Toileting steps completed by helper: Adjust clothing prior to toileting, Performs  perineal hygiene, Adjust clothing after toileting(per Amiee Meslina, NT report) Toileting Assistive Devices: Grab bar or rail Assist level: More than reasonable time  Function - Toilet Transfers Assist level to toilet: Supervision or verbal cues Assist level from toilet: Supervision or verbal cues  Function - Chair/bed transfer Chair/bed transfer method: Ambulatory Chair/bed transfer assist level: Supervision or verbal cues Chair/bed transfer assistive device: Walker Chair/bed transfer details: Verbal cues for precautions/safety  Function - Locomotion: Wheelchair Will patient use wheelchair at discharge?: No Function - Locomotion: Ambulation Assistive device: Other (comment)(rollator) Max distance: 150' Assist level: Supervision or verbal cues Assist level:  Supervision or verbal cues Assist level: Supervision or verbal cues Assist level: Supervision or verbal cues Assist level: Touching or steadying assistance (Pt > 75%)  Function - Comprehension Comprehension: Auditory Comprehension assist level: Follows basic conversation/direction with extra time/assistive device  Function - Expression Expression: Verbal Expression assist level: Expresses basic needs/ideas: With no assist  Function - Social Interaction Social Interaction assist level: Interacts appropriately with others with medication or extra time (anti-anxiety, antidepressant).  Function - Problem Solving Problem solving assist level: Solves basic 90% of the time/requires cueing < 10% of the time  Function - Memory Memory assist level: Recognizes or recalls 90% of the time/requires cueing < 10% of the time Patient normally able to recall (first 3 days only): Current season, Location of own room, Staff names and faces, That he or she is in a hospital   Medical Problem List and Plan: 1.  Limitations with mobility, endurance, and self-care secondary to debility.  Cont CIR  2.  DVT Prophylaxis/Anticoagulation:  Pharmaceutical: Heparin, patient refusing, educate 3. Pain Management: Continue hydrocodone and flexeril prn, Lyrica in place of gabapentin 4. Mood: LCSW to follow for evaluation and support.  5. Neuropsych: This patient is capable of making decisions on his own behalf. 6. Skin/Wound Care: routine pressure relief measures.  7. Fluids/Electrolytes/Nutrition: Monitor I/O. Renal/diabetic diet with 1200 cc FR/Day 8. T2DM with gastroparesis and peripheral neuropathy: Continue reglan bid for GI symptoms. Monitor BS ac/hs. On Lantus 15 units bid with SSI for elevated BS.  CBG (last 3)  Recent Labs    05/29/17 1618 05/29/17 2211 05/30/17 0639  GLUCAP 101* 145* 70   Labile on 5/29 9. CAD/HTN: Monitor BP bid--on metoprolol   ?Trending up on 5/29, recs per nephro 10.ESRD: Nephrology following  Monitor weight daily with strict I/O. Schedule HD at end of day to help with tolerance of activity. 11. SOB: Continue nebulizers prn. Encourage IS.   Improving 12. Anemia oc chronic disease  Hb 9.1 on 5/27  Cont to monitor 13. Dry eyes  Artificial tears ordered on 5/28  LOS (Days) 5 A FACE TO FACE EVALUATION WAS PERFORMED  Delphia Kaylor Lorie Phenix 05/30/2017, 8:58 AM

## 2017-05-30 NOTE — Progress Notes (Signed)
Physical Therapy Session Note  Patient Details  Name: Russell [name] MRN: [id number] Date of Birth: September 30, 1970  Today's Date: 05/30/2017 PT Individual Time: 1000-1030 PT Individual Time Calculation (min): 30 min   Short Term Goals: Week 1:  PT Short Term Goal 1 (Week 1): =LTG due to estimated LOS  Skilled Therapeutic Interventions/Progress Updates:    Pt seated EOB, agreeable to PT. Pt reports some pain in head, not rated. Sit to stand with SBA. Ambulation 3 x 150 ft with no AD and SBA, somewhat ataxic gait pattern. Side steps with no UE support and close SBA, some dizziness due to head movements up/down to look at feet. Dizziness subsides with standing rest break. Standing balance on Biodex: limits of stability level 3 with BUE support, verbal and manual cues to use hips for multidirectional weight shift following visual cues. Pt left seated EOB with needs in reach, bed alarm in place.  Therapy Documentation Precautions:  Precautions Precautions: Fall Precaution Comments: impulsive Restrictions Weight Bearing Restrictions: No  See Function Navigator for Current Functional Status.   Therapy/Group: Individual Therapy  Peter Congo, PT, DPT  05/30/2017, 11:58 AM

## 2017-05-30 NOTE — Progress Notes (Signed)
Physical Therapy Session Note  Patient Details  Name: Russell [name] MRN: [id number] Date of Birth: December 16, 1970  Today's Date: 05/30/2017 PT Individual Time: 1100-1153 PT Individual Time Calculation (min): 53 min   Short Term Goals: Week 1:  PT Short Term Goal 1 (Week 1): =LTG due to estimated LOS  Skilled Therapeutic Interventions/Progress Updates: Pt presented at EOb stating fatigued and with HA however received pain meds and agreeable to therapy. Pt requesting to continue working on fine motor skills. Pt ambulated to rehab gym no AD and participated in bug game and opening/closing pills in sitting. Pt performed x 2 rounds of peg board standing on Airex for balance challenge. Pt with most difficulty stacking pegs and was able to connect/lock approx 50%. Pt only able to pull apart 20%. Pt ambulated around unit for endurance with x 2 standing rest breaks. Pt returned to room and sat at EOB with bed alarm on and needs met.      Therapy Documentation Precautions:  Precautions Precautions: Fall Precaution Comments: impulsive Restrictions Weight Bearing Restrictions: No General:   Vital Signs:    See Function Navigator for Current Functional Status.   Therapy/Group: Individual Therapy  Raven Furnas  Carlisha Wisler, PTA  05/30/2017, 12:06 PM

## 2017-05-30 NOTE — Progress Notes (Signed)
Pt returned to room approx 1830. VSS. Will cont to monitor.   KMABE LPN

## 2017-05-30 NOTE — Progress Notes (Signed)
cbg 70, pt asymptomatic, peanut butter and graham crackers given, pt will have breakfast soon, no c/o voiced,  according to orders, recheck if cbg is less than 70.

## 2017-05-30 NOTE — Progress Notes (Addendum)
Subjective: on HD wanting to challenge more fluid uf on HD ,no cos , "getting Stronger   Objective Vital signs in last 24 hours: Vitals:   05/30/17 1415 05/30/17 1417 05/30/17 1420 05/30/17 1430  BP: 139/84 117/74 137/77 138/74  Pulse: 73 71 70 69  Resp: 18     Temp: 97.7 F (36.5 C)     TempSrc: Oral     SpO2: 95%     Weight: 123.6 kg (272 lb 7.8 oz)     Height:       Weight change:   Physical Exam: General: Alert WM , NAD  On HD  Heart: RRR , no rmg Lungs: CTA  bilat Abdomen:  Obese, soft , NT, ND Extremities: trace bipedal edema  Dialysis Access: LUA AVF patent  on hd    Dialysis: [facility] 4h  116kg  2/2.25  Hep none  - parsa 5 - venofer 50 /wk   - mircera 75 ug last 5/1 - calc 1.25 ug     Problem/Plan: 1  ESRD = HD MWF- 2. Lower Extrem Debility - on CIR now 3. Sp diab gastroparesis 4 Dyspnea/ pulm edema/ vol ^ -  breathing better, wts up in hosp 7 kg today / uf on hd today up  5 Anemia ckd getting darbe 100 / wk here 6. MBD-po vit d . / Phoslo binder- phos improving in hosp  8.7>5.9 with binders  Lenny Pastel, PA-C [facility] [phone] 05/30/2017,3:07 PM  LOS: 5 days   Pt seen, examined and agree w A/P as above.  [name] MD [employer] pager [phone]   05/30/2017, 3:52 PM    Labs: Basic Metabolic Panel: Recent Labs  Lab 05/25/17 0836 05/28/17 0710  NA 129* 133*  K 5.0 5.3*  CL 90* 92*  CO2 26 26  GLUCOSE 92 169*  BUN 50* 60*  CREATININE 9.18* 10.66*  CALCIUM 9.2 9.4  PHOS 6.8* 5.9*   Liver Function Tests: Recent Labs  Lab 05/25/17 0836 05/28/17 0710  ALBUMIN 3.6 3.3*   No results for input(s): LIPASE, AMYLASE in the last 168 hours. No results for input(s): AMMONIA in the last 168 hours. CBC: Recent Labs  Lab 05/25/17 0836 05/28/17 0710  WBC 8.1 5.3  HGB 9.4* 9.1*  HCT 29.1* 27.6*  MCV 94.5 94.5  PLT 258 210   Cardiac Enzymes: No results for input(s): CKTOTAL, CKMB,  CKMBINDEX, TROPONINI in the last 168 hours. CBG: Recent Labs  Lab 05/29/17 1144 05/29/17 1618 05/29/17 2211 05/30/17 0639 05/30/17 1209  GLUCAP 109* 101* 145* 70 145*    Studies/Results: No results found. Medications:  . aspirin  81 mg Oral Daily  . atorvastatin  80 mg Oral q1800  . calcitRIOL  1.25 mcg Oral Q M,W,F-HD  . calcium acetate  2,001 mg Oral TID WC  . Chlorhexidine Gluconate Cloth  6 each Topical Q0600  . Chlorhexidine Gluconate Cloth  6 each Topical Q0600  . cinacalcet  30 mg Oral Q M,W,F  . darbepoetin (ARANESP) injection - DIALYSIS  100 mcg Intravenous Q Wed-HD  . DULoxetine  30 mg Oral Daily  . fluticasone  1 spray Each Nare Daily  . heparin  5,000 Units Subcutaneous Q8H  . insulin aspart  0-15 Units Subcutaneous TID WC  . insulin aspart  0-5 Units Subcutaneous QHS  . insulin glargine  15 Units Subcutaneous BID  . levothyroxine  50 mcg Oral QAC breakfast  . metoCLOPramide  5 mg Oral q12n4p  .  metoprolol succinate  37.5 mg Oral BID  . pantoprazole  40 mg Oral BID AC  . pregabalin  75 mg Oral Daily

## 2017-05-31 ENCOUNTER — Inpatient Hospital Stay (HOSPITAL_COMMUNITY): Payer: Medicare Other | Admitting: Occupational Therapy

## 2017-05-31 ENCOUNTER — Inpatient Hospital Stay (HOSPITAL_COMMUNITY): Payer: Medicare Other | Admitting: Physical Therapy

## 2017-05-31 ENCOUNTER — Inpatient Hospital Stay (HOSPITAL_COMMUNITY): Payer: Medicare Other

## 2017-05-31 LAB — GLUCOSE, CAPILLARY
GLUCOSE-CAPILLARY: 84 mg/dL (ref 65–99)
GLUCOSE-CAPILLARY: 85 mg/dL (ref 65–99)
GLUCOSE-CAPILLARY: 190 mg/dL — AB (ref 65–99)
Glucose-Capillary: 119 mg/dL — ABNORMAL HIGH (ref 65–99)

## 2017-05-31 MED ORDER — CHLORHEXIDINE GLUCONATE CLOTH 2 % EX PADS: 6.0000 | MEDICATED_PAD | Freq: Every day | CUTANEOUS | Status: DC

## 2017-05-31 NOTE — Patient Care Conference (Signed)
Inpatient RehabilitationTeam Conference and Plan of Care Update Date: 05/30/2017   Time: 2:10 PM    Patient Name: Russell [name]      Medical Record Number: [id number]  Date of Birth: 06/21/1970 Sex: Male         Room/Bed: 4M09C/4M09C-01 Payor Info: Payor: MEDICARE / Plan: MEDICARE PART A AND B / Product Type: *No Product type* /    Admitting Diagnosis: Debility  Admit Date/Time:  05/25/2017  4:17 PM Admission Comments: No comment available   Primary Diagnosis:  <principal problem not specified> Principal Problem: <principal problem not specified>  Patient Active Problem List   Diagnosis Date Noted  . Dry eye syndrome of both eyes   . ESRD on dialysis (HCC)   . Labile blood glucose   . Benign essential HTN   . Neuropathic pain   . Debility 05/25/2017  . Diabetes mellitus type 2 in obese (HCC)   . Diabetic peripheral neuropathy (HCC)   . Diabetic gastroparesis (HCC)   . Hypothyroidism   . Anemia of chronic disease   . Acute blood loss anemia   . Abnormal EKG   . Dysphagia   . Supplemental oxygen dependent   . Pulmonary edema 05/20/2017  . Chronic pain syndrome 04/23/2017  . Chronic peripheral neuropathic pain 04/23/2017  . DKA (diabetic ketoacidoses) (HCC) 03/19/2017  . CAP (community acquired pneumonia) 03/01/2017  . Anxiety 02/06/2017  . Asthma 02/06/2017  . 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 Hyperlipidemia 02/06/2017  . Macular degeneration 02/06/2017  . Neuropathy 02/06/2017  . Restrictive airway disease 02/06/2017  . 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 Quadriparesis (HCC) 12/22/2016  . Gait abnormality 12/22/2016  . Hypertensive urgency 10/13/2016  . Acute pulmonary edema (HCC)   . Anemia in chronic kidney disease, on chronic dialysis (HCC)   . Weakness   . Hyperkalemia 09/26/2016  . History of DVT of lower extremity 08/17/2016  . Arteriosclerotic vascular disease 08/17/2016  . DKA, type 1 (HCC) 06/29/2016  . Nausea  vomiting and diarrhea 06/29/2016  . Malignant hypertension 06/29/2016  . Hypoxemic respiratory failure, chronic (HCC) 05/16/2016  . Anemia due to end stage renal disease (HCC) 05/16/2016  . Pre-transplant evaluation for kidney transplant 04/25/2016  . S/P CABG (coronary artery bypass graft) 09/28/2015  . Coronary artery disease involving native coronary artery of native heart without angina pectoris   . ESRD (end stage renal disease) (HCC) 02/13/2015  . Nausea and vomiting 02/13/2015  . Nephrotic syndrome 01/03/2015  . Essential hypertension, benign   . Diabetic ketoacidosis without coma associated with type 2 diabetes mellitus (HCC)   . Hx of gastroesophageal reflux (GERD) 09/30/2014  . Diabetic neuropathy (HCC) 09/21/2014  . Uncontrolled diabetes mellitus type 2 with peripheral artery disease (HCC) 09/21/2014  . Morbid obesity (HCC) 09/21/2014  . Proteinuria 07/09/2014  . Chest pain 04/22/2014  . Superficial thrombophlebitis 03/24/2014  . Hypothyroidism (acquired) 03/24/2014    Expected Discharge Date: Expected Discharge Date: 06/01/17  Team Members Present: Physician leading conference: Dr. Maryla Morrow Social Worker Present: Dossie Der, LCSW Nurse Present: Willey Blade, RN PT Present: Midge Minium, PT OT Present: Perrin Maltese, OT SLP Present: Reuel Derby, SLP PPS Coordinator present : Tora Duck, RN, CRRN     Current Status/Progress Goal Weekly Team Focus  Medical   Limitations with mobility, endurance, and self-care secondary to debility  Improve mobility, safety, DM/HTN meds, neuropathic pain  See above   Bowel/Bladder   continent of bowel and bladder,  LBM 5/28, oliguric  on HD 3x/week, ESRD  maintain b/b with Mod I Assist      Swallow/Nutrition/ Hydration             ADL's   S overall  mod I overall  BUE FMC, activity tolerance, pt education, AE as needed   Mobility   min guard/S with rollator  modI overall, S in community  activity tolerance, LE  ROM/strengthening, dynamic standing balance   Communication             Safety/Cognition/ Behavioral Observations            Pain   7-9/10, neuropathic chronic pain to hands and feet, CIDP dx 01/2017, monthly IVIG started, pt states dose is due this week. Norco q 6 hours with minimal relief,   pain </= 5   assess pain q shift and prn administer medications as ordered   Skin   skin CDI, Left forearm AV fistula for HD  maintain skin integrity with min A while on IPR  monitor skin q shift and prn      *See Care Plan and progress notes for long and short-term goals.     Barriers to Discharge  Current Status/Progress Possible Resolutions Date Resolved   Physician    Medical stability     See above  Therapies, otpimize DM/HTN meds, optimize neuropathic pain meds      Nursing                  PT                    OT                  SLP                SW                Discharge Planning/Teaching Needs:  HOme with parents, someone will be with him 24 hr in case needed. He is doing well and hopes to be going home soon.      Team Discussion:  Pt doing well and reaching mod/i level goals. Pain issues being managed by MD and adjusting BP meds. Plan discharge Friday after HD.  Revisions to Treatment Plan:  DC 5/31    Continued Need for Acute Rehabilitation Level of Care: The patient requires daily medical management by a physician with specialized training in physical medicine and rehabilitation for the following conditions: Daily direction of a multidisciplinary physical rehabilitation program to ensure safe treatment while eliciting the highest outcome that is of practical value to the patient.: Yes Daily medical management of patient stability for increased activity during participation in an intensive rehabilitation regime.: Yes Daily analysis of laboratory values and/or radiology reports with any subsequent need for medication adjustment of medical intervention for : Diabetes  problems;Blood pressure problems;Neurological problems  Lucy Chris 05/31/2017, 8:44 AM

## 2017-05-31 NOTE — Progress Notes (Addendum)
Occupational Therapy Discharge Summary  Patient Details  Name: Russell [name] MRN: [id number] Date of Birth: 12-30-1970  Today's Date: 05/31/2017 OT Individual Time: 3154-0086 OT Individual Time Calculation (min): 30 min   Session Note:  1505-1535  Pt ambulated down to the therapy gym for session.  Had him focus on FM coordination tasks in sitting working on threading nuts and bolts through a vertical board and tightening and loosening them alternating UEs.  He would demonstrate some difficulty getting them started at times as well as occasional drops, but completed 6-7 bolts in sitting.  Educated pt on FM coordination tasks for home and provided handout and foam pieces to work on in [employer].  Also provided handout as well for therapy putty exercises and for shoulder exercises, sliding hands up the wall.  Finished session with ambulation back to the room and pt transferring into the bed.  Call button and phone in reach.    Second Session:  [phone]   Therapist fabricated two anti-claw splints for bilateral hands to help decreased clawing with hand functional use and hyperextension of the metacarpals.  Pt educated on donning and doffing and wearing schedule of 1 hr per day to start and increasing if there are no wearing or skin issues.  Pt demonstrated donning and doffing independently.  Pt left in room sitting on EOB with call button in reach.     Patient has met 9 of 9 long term goals due to improved balance, ability to compensate for deficits and improved coordination.  Patient to discharge at overall Modified Independent level.  Patient's care partner is independent to provide the necessary physical assistance at discharge.    Reasons goals not met: NA  Recommendation:  Patient will benefit from ongoing skilled OT services in home health setting to continue to advance functional skills in the area of BADL, Vocation and Reduce care partner burden.  Feel pt will benefit from continued  HHOT to further progress ADL function as well as increasing bilateral hand function and coordination to a WFLs level.  Pt is motivated to participate and will benefit from intense continued therapy for building strength, endurance, and balance.    Equipment: No equipment provided  Reasons for discharge: treatment goals met and discharge from hospital  Patient/family agrees with progress made and goals achieved: Yes  OT Discharge Precautions/Restrictions  Precautions Precautions: Fall  Pain Pain Assessment Pain Scale: Faces Pain Score: 0-No pain ADL  See Function Section of chart for details Vision Baseline Vision/History: Wears glasses Wears Glasses: At all times Patient Visual Report: Blurring of vision Additional Comments: Pt reports more blurring of vision but no other deficits. Perception  Perception: Within Functional Limits Praxis Praxis: Intact Cognition Overall Cognitive Status: Within Functional Limits for tasks assessed Arousal/Alertness: Awake/alert Orientation Level: Oriented X4 Attention: Sustained;Selective Sustained Attention: Appears intact Selective Attention: Appears intact Memory: Appears intact Awareness: Appears intact Problem Solving: Appears intact Safety/Judgment: Appears intact Sensation Sensation Light Touch: Impaired Detail Light Touch Impaired Details: Absent RLE;Absent LLE(absent below mid-calf in stocking distribution pattern) Stereognosis: Impaired Detail Stereognosis Impaired Details: Impaired RUE;Impaired LUE Proprioception: Impaired Detail Proprioception Impaired Details: Absent RLE;Absent LLE(absent BLE hallux and ankle, intact at knee) Additional Comments: Pt with decreased light touch sensation in bilateral hands compared to normal. Coordination Gross Motor Movements are Fluid and Coordinated: No Fine Motor Movements are Fluid and Coordinated: No Coordination and Movement Description: Pt with impaired BUE coordination with noted  loss of thenar musculature as well as transfer arch.  He can use both UEs functionally but demonstrates decreased FM control for opening caps and bottles as well as donning socks.   Heel Shin Test: ataxic, decreased excursion Motor  Motor Motor - Discharge Observations: Still with BLE ataxia and weakness as well as BUE weakness in arms and hands. Mobility  Transfers Sit to Stand: 6: Modified independent (Device/Increase time);With upper extremity assist Stand to Sit: 6: Modified independent (Device/Increase time);Without upper extremity assist  Trunk/Postural Assessment  Cervical Assessment Cervical Assessment: Within Functional Limits Thoracic Assessment Thoracic Assessment: Exceptions to WFL(thoracic rounding) Lumbar Assessment Lumbar Assessment: Within Functional Limits Postural Control Postural Control: Within Functional Limits  Balance Balance Balance Assessed: Yes Standardized Balance Assessment Standardized Balance Assessment: Timed Up and Go Test Berg Balance Test Sit to Stand: Able to stand without using hands and stabilize independently Standing Unsupported: Able to stand 2 minutes with supervision Sitting with Back Unsupported but Feet Supported on Floor or Stool: Able to sit safely and securely 2 minutes Stand to Sit: Sits safely with minimal use of hands Transfers: Able to transfer safely, minor use of hands Standing Unsupported with Eyes Closed: Able to stand 10 seconds with supervision Standing Ubsupported with Feet Together: Needs help to attain position but able to stand for 30 seconds with feet together From Standing, Reach Forward with Outstretched Arm: Can reach forward >12 cm safely (5") From Standing Position, Pick up Object from Floor: Able to pick up shoe, needs supervision From Standing Position, Turn to Look Behind Over each Shoulder: Turn sideways only but maintains balance Turn 360 Degrees: Able to turn 360 degrees safely but slowly Standing Unsupported,  Alternately Place Feet on Step/Stool: Able to complete >2 steps/needs minimal assist Standing Unsupported, One Foot in Front: Needs help to step but can hold 15 seconds Standing on One Leg: Tries to lift leg/unable to hold 3 seconds but remains standing independently Total Score: 36 Timed Up and Go Test TUG: Normal TUG Normal TUG (seconds): 11(avg 3 trials without AD) Static Sitting Balance Static Sitting - Balance Support: Feet supported Static Sitting - Level of Assistance: 6: Modified independent (Device/Increase time) Dynamic Sitting Balance Dynamic Sitting - Balance Support: During functional activity Dynamic Sitting - Level of Assistance: 6: Modified independent (Device/Increase time) Static Standing Balance Static Standing - Balance Support: During functional activity Static Standing - Level of Assistance: 6: Modified independent (Device/Increase time) Dynamic Standing Balance Dynamic Standing - Balance Support: During functional activity Dynamic Standing - Level of Assistance: 6: Modified independent (Device/Increase time) Extremity/Trunk Assessment RUE Assessment RUE Assessment: Exceptions to [facility] RUE Strength RUE Overall Strength Comments: Pt demonstrates AROM WFLS for shoulder, elbow, and wrist.  Strength overall 4/5 in the shoulders and elbows, with 3+/5 in the wrist flexors and extensors.  Gross grasp and release present but noted atrophy in the thenar eminence and transverse arches.  Grip strength 5 lbs.  Decreased ability to manipulate and open toothpaste or soap and could not sustain enough grip to pull up gripper socks once he started them over his feet.   LUE Assessment LUE Assessment: Exceptions to [facility] LUE Strength LUE Overall Strength Comments: Pt demonstrates AROM WFLS for shoulder, elbow, and wrist.  Strength overall 3+/5 in the shoulder and elbow, with 3+/5 in the wrist flexors and extensors as well.  Gross grasp and release present but noted atrophy in the thenar  eminence and transverse arches.  Grip strength 1 lb.  Decreased ability to manipulate and open toothpaste or soap and could not sustain enough grip to  pull up gripper socks once he started them over his feet.   See Function Navigator for Current Functional Status.  Kalianne Fetting OTR/L 05/31/2017, 5:05 PM

## 2017-05-31 NOTE — Progress Notes (Signed)
Subjective/Complaints: Patient seen lying bed this morning. He states he slept well overnight. He denies complaints.  ROS: denies N/V/D, CP or SOB  Objective: Vital Signs: Blood pressure 123/78, pulse 72, temperature 98.1 F (36.7 C), temperature source Oral, resp. rate 18, height 6' 2"  (1.88 m), weight 121 kg (266 lb 12.1 oz), SpO2 98 %. No results found. Results for orders placed or performed during the hospital encounter of 05/25/17 (from the past 72 hour(s))  Glucose, capillary     Status: Abnormal   Collection Time: 05/28/17 12:32 PM  Result Value Ref Range   Glucose-Capillary 148 (H) 65 - 99 mg/dL  Glucose, capillary     Status: Abnormal   Collection Time: 05/28/17  4:21 PM  Result Value Ref Range   Glucose-Capillary 203 (H) 65 - 99 mg/dL  Glucose, capillary     Status: Abnormal   Collection Time: 05/28/17  9:39 PM  Result Value Ref Range   Glucose-Capillary 145 (H) 65 - 99 mg/dL  Glucose, capillary     Status: Abnormal   Collection Time: 05/29/17  6:32 AM  Result Value Ref Range   Glucose-Capillary 136 (H) 65 - 99 mg/dL  Glucose, capillary     Status: Abnormal   Collection Time: 05/29/17 11:44 AM  Result Value Ref Range   Glucose-Capillary 109 (H) 65 - 99 mg/dL  Glucose, capillary     Status: Abnormal   Collection Time: 05/29/17  4:18 PM  Result Value Ref Range   Glucose-Capillary 101 (H) 65 - 99 mg/dL  Glucose, capillary     Status: Abnormal   Collection Time: 05/29/17 10:11 PM  Result Value Ref Range   Glucose-Capillary 145 (H) 65 - 99 mg/dL  Glucose, capillary     Status: None   Collection Time: 05/30/17  6:39 AM  Result Value Ref Range   Glucose-Capillary 70 65 - 99 mg/dL  Glucose, capillary     Status: Abnormal   Collection Time: 05/30/17 12:09 PM  Result Value Ref Range   Glucose-Capillary 145 (H) 65 - 99 mg/dL  CBC with Differential/Platelet     Status: Abnormal   Collection Time: 05/30/17  2:40 PM  Result Value Ref Range   WBC 5.9 4.0 - 10.5 K/uL   RBC 2.98 (L) 4.22 - 5.81 MIL/uL   Hemoglobin 8.9 (L) 13.0 - 17.0 g/dL   HCT 27.6 (L) 39.0 - 52.0 %   MCV 92.6 78.0 - 100.0 fL   MCH 29.9 26.0 - 34.0 pg   MCHC 32.2 30.0 - 36.0 g/dL   RDW 15.2 11.5 - 15.5 %   Platelets 220 150 - 400 K/uL   Neutrophils Relative % 69 %   Neutro Abs 4.1 1.7 - 7.7 K/uL   Lymphocytes Relative 14 %   Lymphs Abs 0.8 0.7 - 4.0 K/uL   Monocytes Relative 10 %   Monocytes Absolute 0.6 0.1 - 1.0 K/uL   Eosinophils Relative 6 %   Eosinophils Absolute 0.4 0.0 - 0.7 K/uL   Basophils Relative 1 %   Basophils Absolute 0.0 0.0 - 0.1 K/uL   Immature Granulocytes 0 %   Abs Immature Granulocytes 0.0 0.0 - 0.1 K/uL    Comment: Performed at [facility], [address] [place], [place] [address]  Renal function panel     Status: Abnormal   Collection Time: 05/30/17  2:40 PM  Result Value Ref Range   Sodium 133 (L) 135 - 145 mmol/L   Potassium 5.1 3.5 - 5.1 mmol/L  Chloride 96 (L) 101 - 111 mmol/L   CO2 25 22 - 32 mmol/L   Glucose, Bld 126 (H) 65 - 99 mg/dL   BUN 60 (H) 6 - 20 mg/dL   Creatinine, Ser 10.15 (H) 0.61 - 1.24 mg/dL   Calcium 9.4 8.9 - 10.3 mg/dL   Phosphorus 5.0 (H) 2.5 - 4.6 mg/dL   Albumin 3.4 (L) 3.5 - 5.0 g/dL   GFR calc non Af Amer 5 (L) >60 mL/min   GFR calc Af Amer 6 (L) >60 mL/min    Comment: (NOTE) The eGFR has been calculated using the CKD EPI equation. This calculation has not been validated in all clinical situations. eGFR's persistently <60 mL/min signify possible Chronic Kidney Disease.    Anion gap 12 5 - 15    Comment: Performed at [facility] [address] [place], [place] [address]  Glucose, capillary     Status: Abnormal   Collection Time: 05/30/17  7:08 PM  Result Value Ref Range   Glucose-Capillary 122 (H) 65 - 99 mg/dL  Glucose, capillary     Status: Abnormal   Collection Time: 05/30/17  9:24 PM  Result Value Ref Range   Glucose-Capillary 201 (H) 65 - 99 mg/dL  Glucose, capillary     Status: None    Collection Time: 05/31/17  6:41 AM  Result Value Ref Range   Glucose-Capillary 84 65 - 99 mg/dL    Gen NAD. Vital signs reviewed. HENT: Normocephalic, atraumatic. Eyes: EOMI. No discharge. Cardio: RRR and no JVD Resp: CTA B/L and unlabored GI: BS positive and ND Musc: No edema or tenderness in extremities Neuro: Alert and Oriented,  Motor 5/5 bilateral del bi tri, 4/5 grip RLE: 4+/5 HF, KE, 4/5 ankle DF (stable) LLE: 4+/5 bilateral HF, KE, 2/5 ankle DF (stable) Skin: Warm and dry. Intact.   Assessment/Plan: 1. Functional deficits secondary to Debility due to acute renal failure which require 3+ hours per day of interdisciplinary therapy in a comprehensive inpatient rehab setting. Physiatrist is providing close team supervision and 24 hour management of active medical problems listed below. Physiatrist and rehab team continue to assess barriers to discharge/monitor patient progress toward functional and medical goals. FIM: Function - Bathing Position: Shower Body parts bathed by patient: Right arm, Left arm, Chest, Abdomen, Front perineal area, Buttocks, Right upper leg, Left upper leg, Left lower leg, Right lower leg Bathing not applicable: Back(did not attempt) Assist Level: Supervision or verbal cues  Function- Upper Body Dressing/Undressing What is the patient wearing?: Pull over shirt/dress Pull over shirt/dress - Perfomed by patient: Thread/unthread right sleeve, Thread/unthread left sleeve, Put head through opening, Pull shirt over trunk Assist Level: Supervision or verbal cues Function - Lower Body Dressing/Undressing What is the patient wearing?: Pants, Shoes, Non-skid slipper socks Position: Other (comment)(standing next to the shower with use of the wall and grab bar ) Pants- Performed by patient: Thread/unthread right pants leg, Thread/unthread left pants leg, Pull pants up/down Non-skid slipper socks- Performed by helper: Don/doff right sock, Don/doff left sock Shoes  - Performed by patient: Don/doff right shoe, Don/doff left shoe Assist for lower body dressing: Touching or steadying assistance (Pt > 75%)(per OT note)  Function - Toileting Toileting steps completed by patient: Adjust clothing prior to toileting, Performs perineal hygiene, Adjust clothing after toileting Toileting steps completed by helper: Adjust clothing prior to toileting, Performs perineal hygiene, Adjust clothing after toileting(per Amiee Meslina, NT report) Toileting Assistive Devices: Grab bar or rail Assist level: More than  reasonable time  Function - Toilet Transfers Assist level to toilet: Supervision or verbal cues Assist level from toilet: Supervision or verbal cues  Function - Chair/bed transfer Chair/bed transfer method: Ambulatory Chair/bed transfer assist level: Supervision or verbal cues Chair/bed transfer assistive device: Walker Chair/bed transfer details: Verbal cues for precautions/safety  Function - Locomotion: Wheelchair Will patient use wheelchair at discharge?: No Function - Locomotion: Ambulation Assistive device: No device Max distance: 150' Assist level: Supervision or verbal cues Assist level: Supervision or verbal cues Assist level: Supervision or verbal cues Assist level: Supervision or verbal cues Assist level: Touching or steadying assistance (Pt > 75%)  Function - Comprehension Comprehension: Auditory Comprehension assist level: Follows complex conversation/direction with extra time/assistive device  Function - Expression Expression: Verbal Expression assist level: Expresses basic needs/ideas: With no assist  Function - Social Interaction Social Interaction assist level: Interacts appropriately with others with medication or extra time (anti-anxiety, antidepressant).  Function - Problem Solving Problem solving assist level: Solves basic problems with no assist  Function - Memory Memory assist level: Recognizes or recalls 90% of the  time/requires cueing < 10% of the time Patient normally able to recall (first 3 days only): Current season, Location of own room, Staff names and faces, That he or she is in a hospital   Medical Problem List and Plan: 1.  Limitations with mobility, endurance, and self-care secondary to debility.  Cont CIR  2.  DVT Prophylaxis/Anticoagulation: Pharmaceutical: Heparin, patient refusing, educate 3. Pain Management: Continue hydrocodone and flexeril prn, Lyrica in place of gabapentin  Appears to be relatively controlled at present 4. Mood: LCSW to follow for evaluation and support.  5. Neuropsych: This patient is capable of making decisions on his own behalf. 6. Skin/Wound Care: routine pressure relief measures.  7. Fluids/Electrolytes/Nutrition: Monitor I/O. Renal/diabetic diet with 1200 cc FR/Day 8. T2DM with gastroparesis and peripheral neuropathy: Continue reglan bid for GI symptoms. Monitor BS ac/hs. On Lantus 15 units bid with SSI for elevated BS.  CBG (last 3)  Recent Labs    05/30/17 1908 05/30/17 2124 05/31/17 0641  GLUCAP 122* 201* 84   Labile on 5/30 9. CAD/HTN: Monitor BP bid--on metoprolol   Relatively controlled on 5/30, recs per nephro 10.ESRD: Nephrology following  Monitor weight daily with strict I/O. Schedule HD at end of day to help with tolerance of activity. 11. SOB: Continue nebulizers prn. Encourage IS.   Improving 12. Anemia oc chronic disease  Hb 8.9 on 5/29  Cont to monitor 13. Dry eyes  Artificial tears ordered on 5/28  LOS (Days) 6 A FACE TO FACE EVALUATION WAS PERFORMED  Asami Lambright Lorie Phenix 05/31/2017, 8:53 AM

## 2017-05-31 NOTE — Progress Notes (Signed)
Occupational Therapy Session Note  Patient Details  Name: Russell [name] MRN: [id number] Date of Birth: Nov 08, 1970  Today's Date: 05/31/2017 OT Individual Time: 1101-1200 OT Individual Time Calculation (min): 59 min    Short Term Goals: Week 1:  OT Short Term Goal 1 (Week 1): STGs equal to LTGs based on current ELOS and set a modified independent level overall.   Skilled Therapeutic Interventions/Progress Updates:    Pt ambulated down to the dayroom with supervision and one standing rest break.  Once in the dayroom, he completed UE strengthening with use of the UE ergonemeter.  Three sets of 3 mins were completed, 2 forward and 1 backwards, with resistance set on level 5 and RPMs maintained at 15.  Once this was completed, had pt work on theraputty exercises for BUEs.  He worked on gross digit flexion, tip to tip pinch, and thumb flexion for each UE for 3-5 repetitions.  He was able to attempt intrinsic digit flexion and digit extension as well with min assist and mod demonstrational cueing.  Pt still with weakness in the transverse arches an demonstrates decreased ability to maintain PIP extension for in-hand stregnthening.  Finished session with ambulation back to the room and pt left sitting EOB eating lunch.  Call button and phone in reach.    Therapy Documentation Precautions:  Precautions Precautions: Fall Precaution Comments: impulsive Restrictions Weight Bearing Restrictions: No Pain: Pain Assessment Pain Scale: 0-10 Pain Score: 9  Pain Type: Neuropathic pain Pain Location: Hand Pain Descriptors / Indicators: Burning Pain Frequency: Constant Pain Onset: On-going Patients Stated Pain Goal: 4 Pain Intervention(s): Medication (See eMAR) ADL: See Function Navigator for Current Functional Status.   Therapy/Group: Individual Therapy  Linton Stolp OTR/L 05/31/2017, 12:50 PM

## 2017-05-31 NOTE — Progress Notes (Addendum)
Physical Therapy Discharge Summary  Patient Details  Name: Russell [name] MRN: [id number] Date of Birth: 10-Feb-1970  Today's Date: 05/31/2017 PT Individual Time: 0800-0855 PT Individual Time Calculation (min): 55 min    Patient has met 8 of 8 long term goals due to improved activity tolerance, improved balance, improved postural control and increased strength.  Patient to discharge at an ambulatory level Supervision.   Patient's care partner is independent to provide the necessary physical assistance at discharge.  Reasons goals not met: All goals met  Recommendation:  Patient will benefit from ongoing skilled PT services in home health setting to continue to advance safe functional mobility, address ongoing impairments in ROM, strength, balance, coordination, activity tolerance, and minimize fall risk.  Equipment: rollator  Reasons for discharge: treatment goals met and discharge from hospital  Patient/family agrees with progress made and goals achieved: Yes  PT Discharge Precautions/Restrictions Restrictions Weight Bearing Restrictions: No Pain Pain Assessment Pain Scale: 0-10 Pain Score: 3  Pain Type: Neuropathic pain Pain Location: Foot Pain Orientation: Right;Left Pain Descriptors / Indicators: Burning Pain Onset: On-going Patients Stated Pain Goal: 2 Pain Intervention(s): Medication (See eMAR) Cognition Orientation Level: Oriented X4 Sensation Sensation Light Touch: Impaired Detail Light Touch Impaired Details: Absent RLE;Absent LLE(absent below mid-calf in stocking distribution pattern) Proprioception: Impaired Detail Proprioception Impaired Details: Absent RLE;Absent LLE(absent BLE hallux and ankle, intact at knee) Coordination Gross Motor Movements are Fluid and Coordinated: No Heel Shin Test: ataxic, decreased excursion Motor  Motor Motor - Discharge Observations: sensory ataxia BLEs during gait  Mobility Bed Mobility Bed Mobility: Supine to Sit;Sit to  Supine Supine to Sit: 6: Modified independent (Device/Increase time);HOB flat Sit to Supine: 6: Modified independent (Device/Increase time);HOB flat Transfers Transfers: Yes Sit to Stand: 6: Modified independent (Device/Increase time) Stand to Sit: 6: Modified independent (Device/Increase time) Stand Pivot Transfers: 6: Modified independent (Device/Increase time) Locomotion  Ambulation Ambulation: Yes Ambulation/Gait Assistance: 5: Supervision Ambulation Distance (Feet): 150 Feet Assistive device: None Ambulation/Gait Assistance Details: Verbal cues for precautions/safety Gait Gait: Yes Gait Pattern: Impaired Gait Pattern: Wide base of support;Poor foot clearance - left;Poor foot clearance - right;Left foot flat;Right foot flat Gait velocity: 3.0 ft/sec Stairs / Additional Locomotion Stairs: Yes Stairs Assistance: 5: Supervision Stair Management Technique: Two rails;Step to pattern;Forwards Number of Stairs: 12 Height of Stairs: 6 Ramp: 5: Supervision Curb: 5: Supervision Wheelchair Mobility Wheelchair Mobility: No  Trunk/Postural Assessment  Cervical Assessment Cervical Assessment: Within Functional Limits Thoracic Assessment Thoracic Assessment: Exceptions to WFL(increased kyphosis) Lumbar Assessment Lumbar Assessment: Within Functional Limits Postural Control Postural Control: Within Functional Limits  Balance Standardized Balance Assessment Standardized Balance Assessment: Timed Up and Go Test Berg Balance Test Sit to Stand: Able to stand without using hands and stabilize independently Standing Unsupported: Able to stand 2 minutes with supervision Sitting with Back Unsupported but Feet Supported on Floor or Stool: Able to sit safely and securely 2 minutes Stand to Sit: Sits safely with minimal use of hands Transfers: Able to transfer safely, minor use of hands Standing Unsupported with Eyes Closed: Able to stand 10 seconds with supervision Standing Ubsupported  with Feet Together: Needs help to attain position but able to stand for 30 seconds with feet together From Standing, Reach Forward with Outstretched Arm: Can reach forward >12 cm safely (5") From Standing Position, Pick up Object from Floor: Able to pick up shoe, needs supervision From Standing Position, Turn to Look Behind Over each Shoulder: Turn sideways only but maintains balance Turn 360 Degrees: Able to  turn 360 degrees safely but slowly Standing Unsupported, Alternately Place Feet on Step/Stool: Able to complete >2 steps/needs minimal assist Standing Unsupported, One Foot in Front: Needs help to step but can hold 15 seconds Standing on One Leg: Tries to lift leg/unable to hold 3 seconds but remains standing independently Total Score: 36 Timed Up and Go Test TUG: Normal TUG Normal TUG (seconds): 11(avg 3 trials without AD) Static Sitting Balance Static Sitting - Balance Support: Feet supported Static Sitting - Level of Assistance: 6: Modified independent (Device/Increase time) Dynamic Sitting Balance Dynamic Sitting - Balance Support: During functional activity Dynamic Sitting - Level of Assistance: 6: Modified independent (Device/Increase time) Static Standing Balance Static Standing - Balance Support: During functional activity Static Standing - Level of Assistance: 6: Modified independent (Device/Increase time) Dynamic Standing Balance Dynamic Standing - Balance Support: During functional activity Dynamic Standing - Level of Assistance: 5: Stand by assistance Extremity Assessment  RUE Assessment RUE Assessment: Exceptions to [facility] RUE Strength RUE Overall Strength Comments: WFL for mobility activities; decreased fine motor control and grip strength LUE Assessment LUE Assessment: Exceptions to [facility] LUE Strength LUE Overall Strength Comments: WFL for mobility activities; decreased fine motor control and grip strength RLE Assessment RLE Assessment: Exceptions to [facility] RLE  Strength Right Hip Flexion: 4/5 Right Knee Flexion: 4/5 Right Knee Extension: 4-/5 Right Ankle Dorsiflexion: 3/5 Right Ankle Plantar Flexion: 3+/5 LLE Assessment LLE Assessment: Exceptions to [facility] LLE Strength Left Hip Flexion: 4/5 Left Knee Flexion: 4/5 Left Knee Extension: 4/5 Left Ankle Dorsiflexion: 3/5 Left Ankle Plantar Flexion: 3+/5  Skilled Therapeutic Intervention: Pt received seated on EOB; denies pain and agreeable to treatment. Assessed all mobility as described above with modI/S overall without AD. Discussed use of rollator in community for increased safety and to allow for seated rest breaks as needed due to fatigue, as well as use in the home on days with less energy or if feeling more unsteady. Pt reporting dizziness; BP assessed sitting 152/98, standing at 1 min 147/80; per RN BLE TEDs donned to A with venous return; educated pt on purpose. Provided additional LE HEP exercises to add to exercises pt had already received including gastroc stretch, standing heel raises, toe raises in sitting. Pt with no further questions or concerns regarding d/c home at this time; reports he is at/near baseline and parents at home are prepared to help him with whatever he needs. Returned to room S gait. Remained seated on EOB, all needs in reach.   See Function Navigator for Current Functional Status.  Benjiman Core Tygielski 05/31/2017, 8:58 AM

## 2017-05-31 NOTE — Progress Notes (Signed)
Physical Therapy Session Note  Patient Details  Name: Russell [name] MRN: [id number] Date of Birth: 18-Apr-1970  Today's Date: 05/31/2017 PT Individual Time: 0932-1015 PT Individual Time Calculation (min): 43 min   Short Term Goals: Week 1:  PT Short Term Goal 1 (Week 1): =LTG due to estimated LOS  Skilled Therapeutic Interventions/Progress Updates:    pt reports his BP is off today so he is not feeling as well but agreeable to session. Denies concerns in regards to d/c . Education provided throughout in regards to continued exercises at home, energy conservation techniques, and safety with mobility. Administered standardized balance assessments (see below) for fall risk assessment. Pt request to work on fine motor activities - utilized bug game for fine motor manipulation in standing including opening and closing jar as well. Mod I for gait to/from therapy with pt able to correct for mild LOB.   Therapy Documentation Precautions:  Precautions Precautions: Fall Precaution Comments: impulsive Restrictions Weight Bearing Restrictions: No   Pain: Pain Assessment Pain Scale: 0-10 Pain Score: 3  Pain Type: Neuropathic pain Pain Location: Foot Pain Orientation: Right;Left Pain Descriptors / Indicators: Burning Pain Onset: On-going Patients Stated Pain Goal: 2 Pain Intervention(s): Medication (See eMAR)    Balance: Standardized Balance Assessment Standardized Balance Assessment: Timed Up and Go Test Berg Balance Test Sit to Stand: Able to stand without using hands and stabilize independently Standing Unsupported: Able to stand 2 minutes with supervision Sitting with Back Unsupported but Feet Supported on Floor or Stool: Able to sit safely and securely 2 minutes Stand to Sit: Sits safely with minimal use of hands Transfers: Able to transfer safely, minor use of hands Standing Unsupported with Eyes Closed: Able to stand 10 seconds with supervision Standing Ubsupported with Feet  Together: Needs help to attain position but able to stand for 30 seconds with feet together From Standing, Reach Forward with Outstretched Arm: Can reach forward >12 cm safely (5") From Standing Position, Pick up Object from Floor: Able to pick up shoe, needs supervision From Standing Position, Turn to Look Behind Over each Shoulder: Turn sideways only but maintains balance Turn 360 Degrees: Able to turn 360 degrees safely but slowly Standing Unsupported, Alternately Place Feet on Step/Stool: Able to complete >2 steps/needs minimal assist Standing Unsupported, One Foot in Front: Needs help to step but can hold 15 seconds Standing on One Leg: Tries to lift leg/unable to hold 3 seconds but remains standing independently Total Score: 36 Timed Up and Go Test TUG: Normal TUG Normal TUG (seconds): 11(avg 3 trials without AD)  See Function Navigator for Current Functional Status.   Therapy/Group: Individual Therapy  Karolee Stamps Darrol Poke, PT, DPT  05/31/2017, 10:15 AM

## 2017-05-31 NOTE — Significant Event (Signed)
Patient continues to refuse heparin injections.  Patient states he recently had a procedure on his eyes and his ophthalmologist recommended to refrain from such a medication.  Per night nurse, MD is aware of patients continued refusal of medication.  Dani Gobble, RN

## 2017-05-31 NOTE — Progress Notes (Signed)
Social Work Patient ID: Russell [name], male   DOB: [birth year], 47 y.o.   MRN: [id number]  Contacted [facility] to let him know pt will need to be first run tomorrow due to discharge home. He will place him on the schedule

## 2017-05-31 NOTE — Progress Notes (Signed)
Social Work Patient ID: Russell [name], male   DOB: 12-16-1970, 47 y.o.   MRN: [id number]  Met with pt to discuss team conference goals mod/i level and discharge tomorrow. He is ready to go home and will call for first run for HD prior to discharging home. Agreeable to the equipment and follow up.

## 2017-05-31 NOTE — Progress Notes (Signed)
Social Work   Sayla Golonka, Elveria Rising  Social Worker  Physical Medicine and Rehabilitation  Patient Care Conference  Signed  Date of Service:  05/31/2017  8:44 AM          Signed          Show:Clear all Manual[x] Template[] Copied  Added by: Brooks Stotz, Lemar Livings, LCSW   Hover for details   Inpatient RehabilitationTeam Conference and Plan of Care Update Date: 05/30/2017   Time: 2:10 PM      Patient Name: SATURNINO LIEW      Medical Record Number: [id number]  Date of Birth: 12-Dec-1970 Sex: Male         Room/Bed: 4M09C/4M09C-01 Payor Info: Payor: MEDICARE / Plan: MEDICARE PART A AND B / Product Type: *No Product type* /     Admitting Diagnosis: Debility  Admit Date/Time:  05/25/2017  4:17 PM Admission Comments: No comment available    Primary Diagnosis:  <principal problem not specified> Principal Problem: <principal problem not specified>       Patient Active Problem List    Diagnosis Date Noted  . Dry eye syndrome of both eyes    . ESRD on dialysis (HCC)    . Labile blood glucose    . Benign essential HTN    . Neuropathic pain    . Debility 05/25/2017  . Diabetes mellitus type 2 in obese (HCC)    . Diabetic peripheral neuropathy (HCC)    . Diabetic gastroparesis (HCC)    . Hypothyroidism    . Anemia of chronic disease    . Acute blood loss anemia    . Abnormal EKG    . Dysphagia    . Supplemental oxygen dependent    . Pulmonary edema 05/20/2017  . Chronic pain syndrome 04/23/2017  . Chronic peripheral neuropathic pain 04/23/2017  . DKA (diabetic ketoacidoses) (HCC) 03/19/2017  . CAP (community acquired pneumonia) 03/01/2017  . Anxiety 02/06/2017  . Asthma 02/06/2017  . 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 Hyperlipidemia 02/06/2017  . Macular degeneration 02/06/2017  . Neuropathy 02/06/2017  . Restrictive airway disease 02/06/2017  . 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 Quadriparesis (HCC) 12/22/2016  . Gait  abnormality 12/22/2016  . Hypertensive urgency 10/13/2016  . Acute pulmonary edema (HCC)    . Anemia in chronic kidney disease, on chronic dialysis (HCC)    . Weakness    . Hyperkalemia 09/26/2016  . History of DVT of lower extremity 08/17/2016  . Arteriosclerotic vascular disease 08/17/2016  . DKA, type 1 (HCC) 06/29/2016  . Nausea vomiting and diarrhea 06/29/2016  . Malignant hypertension 06/29/2016  . Hypoxemic respiratory failure, chronic (HCC) 05/16/2016  . Anemia due to end stage renal disease (HCC) 05/16/2016  . Pre-transplant evaluation for kidney transplant 04/25/2016  . S/P CABG (coronary artery bypass graft) 09/28/2015  . Coronary artery disease involving native coronary artery of native heart without angina pectoris    . ESRD (end stage renal disease) (HCC) 02/13/2015  . Nausea and vomiting 02/13/2015  . Nephrotic syndrome 01/03/2015  . Essential hypertension, benign    . Diabetic ketoacidosis without coma associated with type 2 diabetes mellitus (HCC)    . Hx of gastroesophageal reflux (GERD) 09/30/2014  . Diabetic neuropathy (HCC) 09/21/2014  . Uncontrolled diabetes mellitus type 2 with peripheral artery disease (HCC) 09/21/2014  . Morbid obesity (HCC) 09/21/2014  . Proteinuria 07/09/2014  . Chest pain 04/22/2014  . Superficial thrombophlebitis 03/24/2014  . Hypothyroidism (acquired) 03/24/2014  Expected Discharge Date: Expected Discharge Date: 06/01/17   Team Members Present: Physician leading conference: Dr. Maryla Morrow Social Worker Present: Dossie Der, LCSW Nurse Present: Willey Blade, RN PT Present: Midge Minium, PT OT Present: Perrin Maltese, OT SLP Present: Reuel Derby, SLP PPS Coordinator present : Tora Duck, RN, CRRN       Current Status/Progress Goal Weekly Team Focus  Medical     Limitations with mobility, endurance, and self-care secondary to debility  Improve mobility, safety, DM/HTN meds, neuropathic pain  See above   Bowel/Bladder      continent of bowel and bladder,  LBM 5/28, oliguric on HD 3x/week, ESRD  maintain b/b with Mod I Assist      Swallow/Nutrition/ Hydration               ADL's     S overall  mod I overall  BUE FMC, activity tolerance, pt education, AE as needed   Mobility     min guard/S with rollator  modI overall, S in community  activity tolerance, LE ROM/strengthening, dynamic standing balance   Communication               Safety/Cognition/ Behavioral Observations             Pain     7-9/10, neuropathic chronic pain to hands and feet, CIDP dx 01/2017, monthly IVIG started, pt states dose is due this week. Norco q 6 hours with minimal relief,   pain </= 5   assess pain q shift and prn administer medications as ordered   Skin     skin CDI, Left forearm AV fistula for HD  maintain skin integrity with min A while on IPR  monitor skin q shift and prn     *See Care Plan and progress notes for long and short-term goals.      Barriers to Discharge   Current Status/Progress Possible Resolutions Date Resolved   Physician     Medical stability     See above  Therapies, otpimize DM/HTN meds, optimize neuropathic pain meds      Nursing                 PT                    OT                 SLP            SW              Discharge Planning/Teaching Needs:  HOme with parents, someone will be with him 24 hr in case needed. He is doing well and hopes to be going home soon.      Team Discussion:  Pt doing well and reaching mod/i level goals. Pain issues being managed by MD and adjusting BP meds. Plan discharge Friday after HD.  Revisions to Treatment Plan:  DC 5/31    Continued Need for Acute Rehabilitation Level of Care: The patient requires daily medical management by a physician with specialized training in physical medicine and rehabilitation for the following conditions: Daily direction of a multidisciplinary physical rehabilitation program to ensure safe treatment while eliciting the highest  outcome that is of practical value to the patient.: Yes Daily medical management of patient stability for increased activity during participation in an intensive rehabilitation regime.: Yes Daily analysis of laboratory values and/or radiology reports with any subsequent need for medication adjustment of medical intervention  for : Diabetes problems;Blood pressure problems;Neurological problems   Lucy Chris 05/31/2017, 8:44 AM                 Patient ID: [name], male   DOB: [birth year], 47 y.o.   MRN: [id number]

## 2017-06-01 DIAGNOSIS — R0989 Other specified symptoms and signs involving the circulatory and respiratory systems: Secondary | ICD-10-CM

## 2017-06-01 LAB — GLUCOSE, CAPILLARY: GLUCOSE-CAPILLARY: 73 mg/dL (ref 65–99)

## 2017-06-01 MED ORDER — CYCLOBENZAPRINE HCL 5 MG PO TABS: 5.0000 mg | ORAL_TABLET | Freq: Two times a day (BID) | ORAL | 0 refills | Status: DC | PRN

## 2017-06-01 MED ORDER — HYDROCODONE-ACETAMINOPHEN 5-325 MG PO TABS: 1.0000 | ORAL_TABLET | Freq: Two times a day (BID) | ORAL | 0 refills | Status: AC | PRN

## 2017-06-01 MED ORDER — CALCITRIOL 0.5 MCG PO CAPS
ORAL_CAPSULE | ORAL | Status: AC
  Filled 2017-06-01: qty 2

## 2017-06-01 MED ORDER — CALCITRIOL 0.25 MCG PO CAPS: 1.2500 ug | ORAL_CAPSULE | ORAL | Status: AC

## 2017-06-01 MED ORDER — METOPROLOL SUCCINATE ER 25 MG PO TB24
37.5000 mg | ORAL_TABLET | Freq: Two times a day (BID) | ORAL | 0 refills | Status: DC
Start: 2017-06-01 — End: 2017-09-08

## 2017-06-01 MED ORDER — PREGABALIN 75 MG PO CAPS: 75.0000 mg | ORAL_CAPSULE | Freq: Every day | ORAL | 0 refills | Status: DC

## 2017-06-01 MED ORDER — BASAGLAR KWIKPEN 100 UNIT/ML ~~LOC~~ SOPN: PEN_INJECTOR | SUBCUTANEOUS | 3 refills | Status: DC

## 2017-06-01 MED ORDER — ACETAMINOPHEN 325 MG PO TABS: 325.0000 mg | ORAL_TABLET | ORAL | Status: DC | PRN

## 2017-06-01 MED ORDER — DULOXETINE HCL 30 MG PO CPEP: 30.0000 mg | ORAL_CAPSULE | Freq: Every day | ORAL | 0 refills | Status: DC

## 2017-06-01 MED ORDER — HYPROMELLOSE (GONIOSCOPIC) 2.5 % OP SOLN: 1.0000 [drp] | OPHTHALMIC | 0 refills | Status: DC | PRN

## 2017-06-01 MED ORDER — CALCITRIOL 0.25 MCG PO CAPS
ORAL_CAPSULE | ORAL | Status: AC
  Filled 2017-06-01: qty 1

## 2017-06-01 NOTE — Progress Notes (Signed)
Social Work  Discharge Note  The overall goal for the admission was met for:   Discharge location: [place] 24 HR SUPERVISION  Length of Stay: Yes-7 DAYS  Discharge activity level: Yes-MOD/I LEVEL  Home/community participation: Yes  Services provided included: MD, RD, PT, OT, RN, CM, Pharmacy and [place]: Medicare and Private Insurance: COMMERICAL  Follow-up services arranged: Home Health: [facility]-PT, OT & RN, DME: [place] and Patient/Family request agency HH: ACTIVE PT WITH [facility], DME: NO PREF  Comments (or additional information): PT DID VERY WELL AND REACHED HIS GOALS QUICKLY, READY TO [place]  Patient/Family verbalized understanding of follow-up arrangements: Yes  Individual responsible for coordination of the follow-up plan: SELF & PATSY-MOM  Confirmed correct DME delivered: Elease Hashimoto 06/01/2017    Elease Hashimoto

## 2017-06-01 NOTE — Progress Notes (Signed)
Patient discharged home.  Left floor via wheelchair, escorted by nursing staff.  All patient belongings sent with patient, including DME and prescriptions.  Patient verbalized understanding of discharge instructions as given by Marissa NestlePam Love, PA.  Appears to be in no immediate distress at this time.  Dani Gobbleeardon, Alvis Pulcini J, RN

## 2017-06-01 NOTE — Discharge Summary (Signed)
Physician Discharge Summary  Patient ID: RAJAT STAVER MRN: [id number] DOB/AGE: 46-Nov-1972 46 y.o.  Admit date: 05/25/2017 Discharge date: 06/01/2017  Discharge Diagnoses:  Principal Problem:   Debility Active Problems:   Diabetic gastroparesis (HCC)   Dry eye syndrome of both eyes   ESRD on dialysis (HCC)   Benign essential HTN   Neuropathic pain   Discharged Condition:  Stable   Significant Diagnostic Studies: Dg Chest Port 1 View  Result Date: 05/27/2017 CLINICAL DATA:  Shortness of breath. EXAM: PORTABLE CHEST 1 VIEW COMPARISON:  Radiograph of May 20, 2017. FINDINGS: Stable cardiomegaly. Status post coronary artery bypass graft. No pneumothorax or pleural effusion is noted. Both lungs are clear. The visualized skeletal structures are unremarkable. IMPRESSION: No acute cardiopulmonary abnormality seen. Electronically Signed   By: Lupita Raider, M.D.   On: 05/27/2017 10:02   Dg Chest Portable 1 View  Result Date: 05/20/2017 CLINICAL DATA:  Nausea and vomiting since yesterday. Dialysis patient. EXAM: PORTABLE CHEST 1 VIEW COMPARISON:  03/19/2017 FINDINGS: Sternotomy wires are unchanged. Lungs are adequately inflated without focal airspace consolidation or effusion. Minimal prominence of the central perihilar markings which may indicate a mild degree of vascular congestion. Cardiomediastinal silhouette and remainder of the exam is unchanged. IMPRESSION: Possible mild vascular congestion. Electronically Signed   By: Elberta Fortis M.D.   On: 05/20/2017 14:26    Labs:  Basic Metabolic Panel: BMP Latest Ref Rng & Units 05/30/2017 05/28/2017 05/25/2017  Glucose 65 - 99 mg/dL 956(O) 130(Q) 92  BUN 6 - 20 mg/dL 65(H) 84(O) 96(E)  Creatinine 0.61 - 1.24 mg/dL 95.28(U) 13.24(M) 0.10(U)  BUN/Creat Ratio 9 - 20 - - -  Sodium 135 - 145 mmol/L 133(L) 133(L) 129(L)  Potassium 3.5 - 5.1 mmol/L 5.1 5.3(H) 5.0  Chloride 101 - 111 mmol/L 96(L) 92(L) 90(L)  CO2 22 - 32 mmol/L 25 26 26   Calcium  8.9 - 10.3 mg/dL 9.4 9.4 9.2    CBC: CBC Latest Ref Rng & Units 05/30/2017 05/28/2017 05/25/2017  WBC 4.0 - 10.5 K/uL 5.9 5.3 8.1  Hemoglobin 13.0 - 17.0 g/dL 7.2(Z) 3.6(U) 4.4(I)  Hematocrit 39.0 - 52.0 % 27.6(L) 27.6(L) 29.1(L)  Platelets 150 - 400 K/uL 220 210 258    CBG: Recent Labs  Lab 05/31/17 0641 05/31/17 1206 05/31/17 1639 05/31/17 2039 06/01/17 0647  GLUCAP 84 85 119* 190* 73    Brief HPI:   Russell [name] is a 47 year old male with history of ESRD- HD MWF, T2DM with gastroparesis and neuropathy, recent diagnosis of CIDP, asthma who was admitted on 05/20/17 with complaints of SOB,N/V and left sided chest pain. He was found to have pulmonary edema, elevated K+ and hyperglycemia. He was treated with IV calcium gluconate and HD. He continued to have issues with SOB and non-specific chest discomfort. N/V had resolved with improvement in intake. He was noted to be deconditioned and CIR was recommended due to functional deficits.    Hospital Course: BUBBER ROTHERT was admitted to rehab 05/25/2017 for inpatient therapies to consist of PT and OT at least three hours five days a week. Past admission physiatrist, therapy team and rehab RN have worked together to provide customized collaborative inpatient rehab. Blood pressure have been controlled without meds. Abdominal pain and GERD symptoms have improved and intake has been good. Diabetes has been monitored with ac/hs cbg checks. BS are reasonable on lower dose Lantus with meal coverage. He is to resume Basaglar at 15 units with home SSI  regimen. He is to monitor BS tid basis and to call MD for instructions if BS start trending over 150.  He has been educated on importance of maintaining FR to avoid problems with overload. Predialysis weight was 121.2 kg and post dialysis weight is at 116.7 kg.    Oxygen was discontinued and he was encouraged to perform IS during the day. Follow up CXR  follow up CXR 5/26 showed lungs to be clear.   Neurontin has been ineffective therefore this was changed to Lyrica with improvement in symptom management.  OT has fabricated anticlaw splints and he was instructed in working up to using them during the day.  He has been educated on importance of HEP and walker was recommended for safety. He declined this due to concerns about copay. He will continue to receive follow up HHPT and HHOT by [facility] after discharge.    Rehab course: During patient's stay in rehab weekly team conferences were held to monitor patient's progress, set goals and discuss barriers to discharge. At admission, patient required min assist with basic ADLs and mobility. He has had improvement in activity tolerance, balance, postural control as well as ability to compensate for deficits. He is able to complete ADL tasks at modified independent level. He is modified independent for transfers and is able to ambulate 150' without AD. He has been educated on HEP, energy conservation measures and safety with mobility. BERG balance score is at 36/56.     Disposition:  Home   Diet: Diabetic/Renal- limit fluids to 5 cups daily.  Special Instructions: 1.Monitor blood sugars tid  2. Do not drive while taking hydrocodone or flexeril.   Discharge Instructions    Ambulatory referral to Physical Medicine Rehab   Complete by:  As directed    1-2 weeks transitional care appt     Allergies as of 06/01/2017      Reactions   Nsaids Other (See Comments)   Told to avoid all nsaids due to kidney disease    Tape Other (See Comments)   Welts result, if left for a long amount of time      Medication List    STOP taking these medications   ferric gluconate 62.5 mg in sodium chloride 0.9 % 100 mL   gabapentin 100 MG capsule Commonly known as:  NEURONTIN   hydrALAZINE 25 MG tablet Commonly known as:  APRESOLINE   mycophenolate 250 MG capsule Commonly known as:  CELLCEPT   PRIVIGEN 40 GM/400ML Soln Generic drug:  Immune  Globulin (Human)     TAKE these medications   acetaminophen 325 MG tablet Commonly known as:  TYLENOL Take 1-2 tablets (325-650 mg total) by mouth every 4 (four) hours as needed for mild pain. What changed:    medication strength  how much to take  when to take this  reasons to take this   albuterol 108 (90 Base) MCG/ACT inhaler Commonly known as:  PROVENTIL HFA;VENTOLIN HFA Inhale 1 puff into the lungs every 6 (six) hours as needed for wheezing or shortness of breath. What changed:    how much to take  when to take this   aspirin 81 MG EC tablet Take 1 tablet (81 mg total) by mouth daily.   atorvastatin 80 MG tablet Commonly known as:  LIPITOR Take 1 tablet (80 mg total) by mouth daily at 6 PM.   BASAGLAR KWIKPEN 100 UNIT/ML Sopn Inject 15 units 2x a day under skin What changed:  additional instructions  calcitRIOL 0.25 MCG capsule Commonly known as:  ROCALTROL Take 5 capsules (1.25 mcg total) by mouth every Monday, Wednesday, and Friday with hemodialysis.   cinacalcet 30 MG tablet Commonly known as:  SENSIPAR Take 30 mg by mouth every Monday, Wednesday, and Friday.   cyclobenzaprine 5 MG tablet--Rx # 30 pills  Commonly known as:  FLEXERIL Take 1 tablet (5 mg total) by mouth 2 (two) times daily as needed for muscle spasms. What changed:    medication strength  how much to take  when to take this   DULoxetine 30 MG capsule Commonly known as:  CYMBALTA Take 1 capsule (30 mg total) by mouth daily.   fluticasone 50 MCG/ACT nasal spray Commonly known as:  FLONASE Place 1 spray into both nostrils daily.   HYDROcodone-acetaminophen 5-325 MG tablet--Rx # 10 pills Commonly known as:  NORCO/VICODIN Take 1 tablet by mouth every 12 (twelve) hours as needed for severe pain. What changed:    how much to take  when to take this  reasons to take this   hydroxypropyl methylcellulose / hypromellose 2.5 % ophthalmic solution Commonly known as:  ISOPTO TEARS  / GONIOVISC Place 1 drop into both eyes as needed for dry eyes.   insulin aspart 100 UNIT/ML FlexPen Commonly known as:  NOVOLOG FLEXPEN Inject 8-20 Units into the skin 3 (three) times daily with meals. What changed:    when to take this  additional instructions   levothyroxine 50 MCG tablet Commonly known as:  SYNTHROID, LEVOTHROID Take 1 tablet (50 mcg total) by mouth daily before breakfast.   metoCLOPramide 5 MG tablet Commonly known as:  REGLAN Take 1 tablet (5 mg total) by mouth 2 times daily at 12 noon and 4 pm.   metoprolol succinate 25 MG 24 hr tablet Commonly known as:  TOPROL-XL Take 1.5 tablets (37.5 mg total) by mouth 2 (two) times daily.   pantoprazole 40 MG tablet Commonly known as:  PROTONIX Take 1 tablet (40 mg total) by mouth 2 (two) times daily before a meal.   pregabalin 75 MG capsule Commonly known as:  LYRICA Take 1 capsule (75 mg total) by mouth daily. Start taking on:  06/02/2017   Semaglutide 0.25 or 0.5 MG/DOSE Sopn Commonly known as:  OZEMPIC Inject 0.5 mg into the skin once a week.   sucralfate 1 g tablet Commonly known as:  CARAFATE Take 1 tablet (1 g total) by mouth 4 (four) times daily. What changed:    when to take this  reasons to take this      Follow-up Information    Sherren MochaShaw, Eva N, MD Follow up on 06/12/2017.   Specialty:  Family Medicine Why:  Appointment @ 10:20 AM Contact information: [address] [place] [address] [phone]        Marcello FennelPatel, Ankit Anil, MD Follow up.   Specialty:  Physical Medicine and Rehabilitation Why:  office will call you with follow up appointment Contact information: [address] [place] [address] [phone]           Signed: Jacquelynn Creeamela S Love 06/01/2017, 6:06 PM

## 2017-06-01 NOTE — Progress Notes (Addendum)
[facility] Progress Note   Dialysis Orders: mwf ashe 4h 15min 116kg 2/2.25 Hep none  - parsa 5 - venofer 50 /wk  - mircera 75 ug last 5/1 - calc 1.25 ug   Assessment/Plan: 1. LE debilitation - for d/c from CIR today 2. ESRD - HD per routine labs pending Qb 350 - not at full BFR - arterial pressures limiting full BFR - will notify home unit to monitor after d/ 3. Anemia -  Labs pending - last hgb 8.9  5/29 - declining in hospital on higher dose of ESA - needs Fe studies to be done at outpt HD unit- will be d/c on high dose of ESA  4. Secondary hyperparathyroidism - Ca/P ok with current meds 5. HTN/volume - large IDWG - net UF Wed was 3.8 with post wt 119.5 -pre wt today 121.2 - goal set for 4 L - BP limits UF at times - not cramping 6. Nutrition - alb 3.4 7. DM Tommi Rumps/gastroparesis   Sheffield SliderMartha B Bergman, PA-C [facility] Beeper [phone] 06/01/2017,8:54 AM  LOS: 7 days   Pt seen, examined and agree w A/P as above.  Vinson Moselleob Alva Broxson MD [facility] pager [phone]   06/01/2017, 2:27 PM       Subjective:   No c/o.   Objective Vitals:   05/31/17 0453 05/31/17 1412 05/31/17 1927 06/01/17 0506  BP: 123/78 131/65 (!) 164/85 111/68  Pulse: 72 75 78 67  Resp: 18  16 18   Temp: 98.1 F (36.7 C) (!) 97.5 F (36.4 C) 98.1 F (36.7 C) 97.8 F (36.6 C)  TempSrc: Oral Oral Oral Oral  SpO2: 98% 96% 99% 98%  Weight: 121 kg (266 lb 12.1 oz)   120.5 kg (265 lb 10.5 oz)  Height:       Physical Exam General: breathing easily supine on room air Heart: RRR Lungs: grossly clear - poor expansion Abdomen: obese soft NT Extremities: tr LE edema Dialysis Access: left AVF Qb 350   Additional Objective Labs: Basic Metabolic Panel: Recent Labs  Lab 05/28/17 0710 05/30/17 1440  NA 133* 133*  K 5.3* 5.1  CL 92* 96*  CO2 26 25  GLUCOSE 169* 126*  BUN 60* 60*  CREATININE 10.66* 10.15*  CALCIUM 9.4 9.4  PHOS 5.9* 5.0*   Liver Function  Tests: Recent Labs  Lab 05/28/17 0710 05/30/17 1440  ALBUMIN 3.3* 3.4*   No results for input(s): LIPASE, AMYLASE in the last 168 hours. CBC: Recent Labs  Lab 05/28/17 0710 05/30/17 1440  WBC 5.3 5.9  NEUTROABS  --  4.1  HGB 9.1* 8.9*  HCT 27.6* 27.6*  MCV 94.5 92.6  PLT 210 220   Blood Culture    Component Value Date/Time   SDES BLOOD RIGHT HAND 05/16/2016 2032   SPECREQUEST  05/16/2016 2032    BOTTLES DRAWN AEROBIC AND ANAEROBIC Blood Culture adequate volume   CULT NO GROWTH 5 DAYS 05/16/2016 2032   REPTSTATUS 05/21/2016 FINAL 05/16/2016 2032    Cardiac Enzymes: No results for input(s): CKTOTAL, CKMB, CKMBINDEX, TROPONINI in the last 168 hours. CBG: Recent Labs  Lab 05/31/17 0641 05/31/17 1206 05/31/17 1639 05/31/17 2039 06/01/17 0647  GLUCAP 84 85 119* 190* 73   Iron Studies: No results for input(s): IRON, TIBC, TRANSFERRIN, FERRITIN in the last 72 hours. Lab Results  Component Value Date   INR 1.18 03/06/2017   INR 1.0 09/12/2016   INR 1.25 05/16/2016   Studies/Results: No results found. Medications:  . aspirin  81 mg Oral Daily  . atorvastatin  80 mg Oral q1800  . calcitRIOL  1.25 mcg Oral Q M,W,F-HD  . calcium acetate  2,001 mg Oral TID WC  . Chlorhexidine Gluconate Cloth  6 each Topical Q0600  . cinacalcet  30 mg Oral Q M,W,F  . darbepoetin (ARANESP) injection - DIALYSIS  100 mcg Intravenous Q Wed-HD  . DULoxetine  30 mg Oral Daily  . fluticasone  1 spray Each Nare Daily  . heparin  5,000 Units Subcutaneous Q8H  . insulin aspart  0-15 Units Subcutaneous TID WC  . insulin aspart  0-5 Units Subcutaneous QHS  . insulin glargine  15 Units Subcutaneous BID  . levothyroxine  50 mcg Oral QAC breakfast  . metoCLOPramide  5 mg Oral q12n4p  . metoprolol succinate  37.5 mg Oral BID  . pantoprazole  40 mg Oral BID AC  . pregabalin  75 mg Oral Daily

## 2017-06-01 NOTE — Plan of Care (Signed)
  Problem: Consults Goal: RH GENERAL PATIENT EDUCATION Description See Patient Education module for education specifics. Outcome: Progressing Goal: Skin Care Protocol Initiated - if Braden Score 18 or less Description If consults are not indicated, leave blank or document N/A Outcome: Progressing Goal: Nutrition Consult-if indicated Outcome: Progressing Goal: Diabetes Guidelines if Diabetic/Glucose > 140 Description If diabetic or lab glucose is > 140 mg/dl - Initiate Diabetes/Hyperglycemia Guidelines & Document Interventions  Outcome: Progressing   Problem: RH BOWEL ELIMINATION Goal: RH STG MANAGE BOWEL WITH ASSISTANCE Description STG Manage Bowel with  Mod I Assistance.  Outcome: Progressing Goal: RH STG MANAGE BOWEL W/MEDICATION W/ASSISTANCE Description STG Manage Bowel with Medication with mod I  Assistance.  Outcome: Progressing   Problem: RH SKIN INTEGRITY Goal: RH STG SKIN FREE OF INFECTION/BREAKDOWN Description Maintain skin integrity with min assist  Outcome: Progressing Goal: RH STG MAINTAIN SKIN INTEGRITY WITH ASSISTANCE Description STG Maintain Skin Integrity With  Min Assistance.  Outcome: Progressing   Problem: RH SAFETY Goal: RH STG ADHERE TO SAFETY PRECAUTIONS W/ASSISTANCE/DEVICE Description STG Adhere to Safety Precautions With cues/resources Assistance/Device.  Outcome: Progressing Goal: RH STG DECREASED RISK OF FALL WITH ASSISTANCE Description STG Decreased Risk of Fall With  Cues/reminders Assistance.  Outcome: Progressing   Problem: RH PAIN MANAGEMENT Goal: RH STG PAIN MANAGED AT OR BELOW PT'S PAIN GOAL Description At or below level 5  Outcome: Progressing   Problem: RH KNOWLEDGE DEFICIT GENERAL Goal: RH STG INCREASE KNOWLEDGE OF SELF CARE AFTER HOSPITALIZATION Description Pt will be able to explain care at home and direct caregivers independently  Outcome: Progressing   Problem: RH Other (Specify) Goal: RH LTG Other (Specify)1 Outcome:  Progressing   

## 2017-06-01 NOTE — Progress Notes (Addendum)
Social Work Patient ID: Russell [name], male   DOB: December 07, 1970, 47 y.o.   MRN: [id number] Pt has decided he doesn't want the rollator since he may have a co-pay for this. There is no guarentee he will not. Have contacted [facility] to come and pick up the rollator

## 2017-06-01 NOTE — Discharge Instructions (Signed)
Inpatient Rehab Discharge Instructions  Russell [name] Discharge date and time: 06/01/17   Activities/Precautions/ Functional Status: Activity: no lifting, driving, or strenuous exercise  till cleared by MD Diet: diabetic diet and renal diet--Max 5 cups of any fluids daily Wound Care: none needed   Functional status:  ___ No restrictions     ___ Walk up steps independently ___ 24/7 supervision/assistance   ___ Walk up steps with assistance _X__ Intermittent supervision/assistance  _X__ Bathe/dress independently ___ Walk with walker     ___ Bathe/dress with assistance ___ Walk Independently    ___ Shower independently ___ Walk with assistance    ___ Shower with assistance _X__ No alcohol     ___ Return to work/school ________   Special Instructions: 1. Monitor blood sugars 2-3 times a day before meals. Contact MD if blood sugars at home start running over 150.  2. You cannot drive if you are taking hydrocodone or flexeril.    COMMUNITY REFERRALS UPON DISCHARGE:    Home Health:   PT & OT    Agency:[facility] Phone:[phone]   Date of last service:06/01/2017  Medical Equipment/Items Ordered: ROLLATOR Levan Hurst  Agency/Supplier:[facility]  [phone]   Opioid Overdose Opioids are substances that relieve pain by binding to pain receptors in your brain and spinal cord. Opioids include illegal drugs, such as heroin, as well as prescription pain medicines.An opioid overdose happens when you take too much of an opioid substance. This can happen with any type of opioid, including:  Heroin.  Morphine.  Codeine.  Methadone.  Oxycodone.  Hydrocodone.  Fentanyl.  Hydromorphone.  Buprenorphine.  The effects of an overdose can be mild, dangerous, or even deadly. Opioid overdose is a medical emergency. What are the causes? This condition may be caused by:  Taking too much of an opioid by accident.  Taking too much of an opioid on purpose.  An  error made by a health care provider who prescribes a medicine.  An error made by the pharmacist who fills the prescription order.  Using more than one substance that contains opioids at the same time.  Mixing an opioid with a substance that affects your heart, breathing, or blood pressure. These include alcohol, tranquilizers, sleeping pills, illegal drugs, and some over-the-counter medicines.  What increases the risk? This condition is more likely in:  Children. They may be attracted to colorful pills. Because of a child's small size, even a small amount of a drug can be dangerous.  Elderly people. They may be taking many different drugs. Elderly people may have difficulty reading labels or remembering when they last took their medicine.  People who take an opioid on a long-term basis.  People who use: ? Illegal drugs. ? Other substances, including alcohol, while using an opioid.  People who have: ? A history of drug or alcohol abuse. ? Certain mental health conditions.  People who take opioids that are not prescribed for them.  What are the signs or symptoms? Symptoms of this condition depend on the type of opioid and the amount that was taken. Common symptoms include:  Sleepiness or difficulty waking from sleep.  Confusion.  Slurred speech.  Slowed breathing and a slow pulse.  Nausea and vomiting.  Abnormally small pupils.  Signs and symptoms that require emergency treatment include:  Cold, clammy, and pale skin.  Blue lips and fingernails.  Vomiting.  Gurgling sounds in the throat.  A pulse that is very slow or difficult to detect.  Breathing  that is very slow, noisy, or difficult to detect.  Limp body.  Inability to respond to speech or be awakened from sleep (stupor).  How is this diagnosed? This condition is diagnosed based on your symptoms. It is important to tell your health care provider:  All of the opioidsthat you took.  When you took the  opioids.  Whether you were drinking alcohol or using other substances.  Your health care provider will do a physical exam. This exam may include:  Checking and monitoring your heart rate and rhythm, your breathing rate and depth, your temperature, and your blood pressure (vital signs).  Checking for abnormally small pupils.  Measuring oxygen levels in your blood.  You may also have blood tests or urine tests. How is this treated? Supporting your vital signs and your breathing is the first step in treating an opioid overdose. Treatment may also include:  Giving fluids and minerals (electrolytes) through an IV tube.  Inserting a breathing tube (endotracheal tube) in your airway to help you breathe.  Giving oxygen.  Passing a tube through your nose and into your stomach (NG tube, or nasogastric tube) to wash out your stomach.  Giving medicines that: ? Increase your blood pressure. ? Absorb any opioid that is in your digestive system. ? Reverse the effects of the opioid (naloxone).  Ongoing counseling and mental health support if you intentionally overdosed or used an illegal drug.  Follow these instructions at home:  Take over-the-counter and prescription medicines only as told by your health care provider. Always ask your health care provider about possible side effects and interactions of any new medicine that you start taking.  Keep a list of all of the medicines that you take, including over-the-counter medicines. Bring this list with you to all of your medical visits.  Drink enough fluid to keep your urine clear or pale yellow.  Keep all follow-up visits as told by your health care provider. This is important. How is this prevented?  Get help if you are struggling with: ? Alcohol or drug use. ? Depression or another mental health problem.  Keep the phone number of your local poison control center near your phone or on your cell phone.  Store all medicines in safety  containers that are out of the reach of children.  Read the drug inserts that come with your medicines.  Do not drink alcohol when taking opioids.  Do not use illegal drugs.  Do not take opioid medicines that are not prescribed for you. Contact a health care provider if:  Your symptoms return.  You develop new symptoms or side effects when you are taking medicines. Get help right away if:  You think that you or someone else may have taken too much of an opioid. The hotline of the [facility] is (805) 827-5405.  You or someone else is having symptoms of an opioid overdose.  You have serious thoughts about hurting yourself or others.  You have: ? Chest pain. ? Difficulty breathing. ? A loss of consciousness. Opioid overdose is an emergency. Do not wait to see if the symptoms will go away. Get medical help right away. Call your local emergency services (911 in the [place] Do not drive yourself to the hospital. This information is not intended to replace advice given to you by your health care provider. Make sure you discuss any questions you have with your health care provider. Document Released: 01/27/2004 Document Revised: 05/27/2015 Document Reviewed: 06/04/2014 Elsevier Interactive  Patient Education  [employer].  My questions have been answered and I understand these instructions. I will adhere to these goals and the provided educational materials after my discharge from the hospital.  Patient/Caregiver Signature _______________________________ Date __________  Clinician Signature _______________________________________ Date __________  Please bring this form and your medication list with you to all your follow-up doctor's appointments.

## 2017-06-01 NOTE — Progress Notes (Signed)
Subjective/Complaints: Patient seen lying in bed this morning. He states he slept well overnight. He states he is ready for discharge, but wants to make sure that he will be receiving all his pain medications at discharge.  ROS: denies N/V/D, CP or SOB  Objective: Vital Signs: Blood pressure 119/60, pulse 64, temperature 97.7 F (36.5 C), temperature source Oral, resp. rate 16, height _0  (1.88 m), weight 121.2 kg (267 lb 3.2 oz), SpO2 98 %. No results found. Results for orders placed or performed during the hospital encounter of 05/25/17 (from the past 72 hour(s))  Glucose, capillary     Status: Abnormal   Collection Time: 05/29/17 11:44 AM  Result Value Ref Range   Glucose-Capillary 109 (H) 65 - 99 mg/dL  Glucose, capillary     Status: Abnormal   Collection Time: 05/29/17  4:18 PM  Result Value Ref Range   Glucose-Capillary 101 (H) 65 - 99 mg/dL  Glucose, capillary     Status: Abnormal   Collection Time: 05/29/17 10:11 PM  Result Value Ref Range   Glucose-Capillary 145 (H) 65 - 99 mg/dL  Glucose, capillary     Status: None   Collection Time: 05/30/17  6:39 AM  Result Value Ref Range   Glucose-Capillary 70 65 - 99 mg/dL  Glucose, capillary     Status: Abnormal   Collection Time: 05/30/17 12:09 PM  Result Value Ref Range   Glucose-Capillary 145 (H) 65 - 99 mg/dL  CBC with Differential/Platelet     Status: Abnormal   Collection Time: 05/30/17  2:40 PM  Result Value Ref Range   WBC 5.9 4.0 - 10.5 K/uL   RBC 2.98 (L) 4.22 - 5.81 MIL/uL   Hemoglobin 8.9 (L) 13.0 - 17.0 g/dL   HCT 27.6 (L) 39.0 - 52.0 %   MCV 92.6 78.0 - 100.0 fL   MCH 29.9 26.0 - 34.0 pg   MCHC 32.2 30.0 - 36.0 g/dL   RDW 15.2 11.5 - 15.5 %   Platelets 220 150 - 400 K/uL   Neutrophils Relative % 69 %   Neutro Abs 4.1 1.7 - 7.7 K/uL   Lymphocytes Relative 14 %   Lymphs Abs 0.8 0.7 - 4.0 K/uL   Monocytes Relative 10 %   Monocytes Absolute 0.6 0.1 - 1.0 K/uL   Eosinophils Relative 6 %   Eosinophils  Absolute 0.4 0.0 - 0.7 K/uL   Basophils Relative 1 %   Basophils Absolute 0.0 0.0 - 0.1 K/uL   Immature Granulocytes 0 %   Abs Immature Granulocytes 0.0 0.0 - 0.1 K/uL    Comment: Performed at [facility], [address] [place], [place] [address]  Renal function panel     Status: Abnormal   Collection Time: 05/30/17  2:40 PM  Result Value Ref Range   Sodium 133 (L) 135 - 145 mmol/L   Potassium 5.1 3.5 - 5.1 mmol/L   Chloride 96 (L) 101 - 111 mmol/L   CO2 25 22 - 32 mmol/L   Glucose, Bld 126 (H) 65 - 99 mg/dL   BUN 60 (H) 6 - 20 mg/dL   Creatinine, Ser 10.15 (H) 0.61 - 1.24 mg/dL   Calcium 9.4 8.9 - 10.3 mg/dL   Phosphorus 5.0 (H) 2.5 - 4.6 mg/dL   Albumin 3.4 (L) 3.5 - 5.0 g/dL   GFR calc non Af Amer 5 (L) >60 mL/min   GFR calc Af Amer 6 (L) >60 mL/min    Comment: (NOTE) The eGFR has been  calculated using the CKD EPI equation. This calculation has not been validated in all clinical situations. eGFR's persistently <60 mL/min signify possible Chronic Kidney Disease.    Anion gap 12 5 - 15    Comment: Performed at [facility] [address], [place], [place] [address]  Glucose, capillary     Status: Abnormal   Collection Time: 05/30/17  7:08 PM  Result Value Ref Range   Glucose-Capillary 122 (H) 65 - 99 mg/dL  Glucose, capillary     Status: Abnormal   Collection Time: 05/30/17  9:24 PM  Result Value Ref Range   Glucose-Capillary 201 (H) 65 - 99 mg/dL  Glucose, capillary     Status: None   Collection Time: 05/31/17  6:41 AM  Result Value Ref Range   Glucose-Capillary 84 65 - 99 mg/dL  Glucose, capillary     Status: None   Collection Time: 05/31/17 12:06 PM  Result Value Ref Range   Glucose-Capillary 85 65 - 99 mg/dL  Glucose, capillary     Status: Abnormal   Collection Time: 05/31/17  4:39 PM  Result Value Ref Range   Glucose-Capillary 119 (H) 65 - 99 mg/dL   Comment 1 Notify RN   Glucose, capillary     Status: Abnormal   Collection Time: 05/31/17  8:39  PM  Result Value Ref Range   Glucose-Capillary 190 (H) 65 - 99 mg/dL  Glucose, capillary     Status: None   Collection Time: 06/01/17  6:47 AM  Result Value Ref Range   Glucose-Capillary 73 65 - 99 mg/dL    Gen NAD. Vital signs reviewed. HENT: Normocephalic, atraumatic. Eyes: EOMI. No discharge. Cardio: RRR and no JVD Resp: CTA B/L and unlabored GI: BS positive and ND Musc: No edema or tenderness in extremities Neuro: Alert and Oriented,  Motor 5/5 bilateral del bi tri, 4/5 grip RLE: 4+/5 HF, KE, 4/5 ankle DF (unchanged) LLE: 4+/5 bilateral HF, KE, 2/5 ankle DF (unchanged) Skin: Warm and dry. Intact.   Assessment/Plan: 1. Functional deficits secondary to Debility due to acute renal failure which require 3+ hours per day of interdisciplinary therapy in a comprehensive inpatient rehab setting. Physiatrist is providing close team supervision and 24 hour management of active medical problems listed below. Physiatrist and rehab team continue to assess barriers to discharge/monitor patient progress toward functional and medical goals. FIM: Function - Bathing Position: Shower Body parts bathed by patient: Right arm, Left arm, Chest, Abdomen, Front perineal area, Buttocks, Right upper leg, Left upper leg, Left lower leg, Right lower leg Bathing not applicable: Back Assist Level: More than reasonable time  Function- Upper Body Dressing/Undressing What is the patient wearing?: Pull over shirt/dress Pull over shirt/dress - Perfomed by patient: Thread/unthread right sleeve, Thread/unthread left sleeve, Put head through opening, Pull shirt over trunk Assist Level: More than reasonable time Function - Lower Body Dressing/Undressing What is the patient wearing?: Pants, Shoes, Non-skid slipper socks, Underwear Position: Sitting EOB Underwear - Performed by patient: Thread/unthread right underwear leg, Thread/unthread left underwear leg, Pull underwear up/down Pants- Performed by patient:  Thread/unthread right pants leg, Thread/unthread left pants leg, Pull pants up/down Non-skid slipper socks- Performed by helper: Don/doff right sock, Don/doff left sock Shoes - Performed by patient: Don/doff right shoe, Don/doff left shoe, Fasten right, Fasten left(shoe buttons) Assist for lower body dressing: More than reasonable time  Function - Toileting Toileting steps completed by patient: Adjust clothing prior to toileting, Performs perineal hygiene, Adjust clothing after toileting Toileting steps  completed by helper: Adjust clothing prior to toileting, Performs perineal hygiene, Adjust clothing after toileting(per Amiee Meslina, NT report) Toileting Assistive Devices: Grab bar or rail Assist level: More than reasonable time  Function - Toilet Transfers Assist level to toilet: No Help, no cues, assistive device, takes more than a reasonable amount of time Assist level from toilet: No Help, no cues, assistive device, takes more than a reasonable amount of time  Function - Chair/bed transfer Chair/bed transfer method: Ambulatory Chair/bed transfer assist level: No Help, no cues, assistive device, takes more than a reasonable amount of time Chair/bed transfer assistive device: Walker Chair/bed transfer details: Verbal cues for precautions/safety  Function - Locomotion: Wheelchair Will patient use wheelchair at discharge?: No Wheelchair activity did not occur: N/A Wheel 50 feet with 2 turns activity did not occur: N/A Wheel 150 feet activity did not occur: N/A Function - Locomotion: Ambulation Assistive device: No device Max distance: 150' Assist level: Supervision or verbal cues Assist level: Supervision or verbal cues Assist level: Supervision or verbal cues Assist level: Supervision or verbal cues Assist level: Supervision or verbal cues  Function - Comprehension Comprehension: Auditory Comprehension assist level: Follows complex conversation/direction with extra  time/assistive device  Function - Expression Expression: Verbal Expression assist level: Expresses complex 90% of the time/cues < 10% of the time  Function - Social Interaction Social Interaction assist level: Interacts appropriately with others with medication or extra time (anti-anxiety, antidepressant).  Function - Problem Solving Problem solving assist level: Solves basic problems with no assist  Function - Memory Memory assist level: Recognizes or recalls 90% of the time/requires cueing < 10% of the time Patient normally able to recall (first 3 days only): Current season, Location of own room, Staff names and faces, That he or she is in a hospital   Medical Problem List and Plan: 1.  Limitations with mobility, endurance, and self-care secondary to debility.  D/c today  Will see patient for transitional care management in 1-2 weeks 2.  DVT Prophylaxis/Anticoagulation: Pharmaceutical: Heparin, patient refusing, educate 3. Pain Management: Continue hydrocodone and flexeril prn, Lyrica in place of gabapentin  Appears to be relatively controlled on 5/31 4. Mood: LCSW to follow for evaluation and support.  5. Neuropsych: This patient is capable of making decisions on his own behalf. 6. Skin/Wound Care: routine pressure relief measures.  7. Fluids/Electrolytes/Nutrition: Monitor I/O. Renal/diabetic diet with 1200 cc FR/Day 8. T2DM with gastroparesis and peripheral neuropathy: Continue reglan bid for GI symptoms. Monitor BS ac/hs. On Lantus 15 units bid with SSI for elevated BS.  CBG (last 3)  Recent Labs    05/31/17 1639 05/31/17 2039 06/01/17 0647  GLUCAP 119* 190* 73   Labile, but overall controlled on 5/31, will need more ambulatory monitoring 9. CAD/HTN: Monitor BP bid--on metoprolol  Labile, but overall controlled on 5/31, will need more ambulatory monitoring 10.ESRD: Nephrology following  Monitor weight daily with strict I/O. Schedule HD at end of day to help with  tolerance of activity. 11. SOB: Continue nebulizers prn. Encourage IS.   Improving 12. Anemia oc chronic disease  Hb 8.9 on 5/29  Cont to monitor 13. Dry eyes  Artificial tears ordered on 5/28  LOS (Days) 7 A FACE TO FACE EVALUATION WAS PERFORMED  Kriste Broman Lorie Phenix 06/01/2017, 9:07 AM

## 2017-06-02 DIAGNOSIS — N186 End stage renal disease: Secondary | ICD-10-CM | POA: Diagnosis not present

## 2017-06-02 DIAGNOSIS — Z992 Dependence on renal dialysis: Secondary | ICD-10-CM | POA: Diagnosis not present

## 2017-06-02 DIAGNOSIS — N049 Nephrotic syndrome with unspecified morphologic changes: Secondary | ICD-10-CM | POA: Diagnosis not present

## 2017-06-04 DIAGNOSIS — E1129 Type 2 diabetes mellitus with other diabetic kidney complication: Secondary | ICD-10-CM | POA: Diagnosis not present

## 2017-06-04 DIAGNOSIS — D509 Iron deficiency anemia, unspecified: Secondary | ICD-10-CM | POA: Diagnosis not present

## 2017-06-04 DIAGNOSIS — N2581 Secondary hyperparathyroidism of renal origin: Secondary | ICD-10-CM | POA: Diagnosis not present

## 2017-06-04 DIAGNOSIS — D631 Anemia in chronic kidney disease: Secondary | ICD-10-CM | POA: Diagnosis not present

## 2017-06-04 DIAGNOSIS — N186 End stage renal disease: Secondary | ICD-10-CM | POA: Diagnosis not present

## 2017-06-06 ENCOUNTER — Telehealth: Payer: Self-pay

## 2017-06-06 DIAGNOSIS — D509 Iron deficiency anemia, unspecified: Secondary | ICD-10-CM | POA: Diagnosis not present

## 2017-06-06 DIAGNOSIS — E1129 Type 2 diabetes mellitus with other diabetic kidney complication: Secondary | ICD-10-CM | POA: Diagnosis not present

## 2017-06-06 DIAGNOSIS — N186 End stage renal disease: Secondary | ICD-10-CM | POA: Diagnosis not present

## 2017-06-06 DIAGNOSIS — D631 Anemia in chronic kidney disease: Secondary | ICD-10-CM | POA: Diagnosis not present

## 2017-06-06 DIAGNOSIS — N2581 Secondary hyperparathyroidism of renal origin: Secondary | ICD-10-CM | POA: Diagnosis not present

## 2017-06-06 NOTE — Telephone Encounter (Signed)
Called pt and got voicemail but mailbox full and couldn't leave message.

## 2017-06-06 NOTE — Telephone Encounter (Signed)
Pt returned call and is declining f/u appt. I explained to pt that Dr. Allena KatzPatel will no longer sign off on any therapy orders and pt understood. He states he will talk to his PCP next week at his appt about signing therapy orders.

## 2017-06-07 ENCOUNTER — Encounter: Payer: Medicare Other | Admitting: Physical Medicine & Rehabilitation

## 2017-06-08 DIAGNOSIS — N186 End stage renal disease: Secondary | ICD-10-CM | POA: Diagnosis not present

## 2017-06-08 DIAGNOSIS — E1129 Type 2 diabetes mellitus with other diabetic kidney complication: Secondary | ICD-10-CM | POA: Diagnosis not present

## 2017-06-08 DIAGNOSIS — D509 Iron deficiency anemia, unspecified: Secondary | ICD-10-CM | POA: Diagnosis not present

## 2017-06-08 DIAGNOSIS — D631 Anemia in chronic kidney disease: Secondary | ICD-10-CM | POA: Diagnosis not present

## 2017-06-08 DIAGNOSIS — N2581 Secondary hyperparathyroidism of renal origin: Secondary | ICD-10-CM | POA: Diagnosis not present

## 2017-06-11 ENCOUNTER — Telehealth: Payer: Self-pay | Admitting: Neurology

## 2017-06-11 DIAGNOSIS — D509 Iron deficiency anemia, unspecified: Secondary | ICD-10-CM | POA: Diagnosis not present

## 2017-06-11 DIAGNOSIS — E1129 Type 2 diabetes mellitus with other diabetic kidney complication: Secondary | ICD-10-CM | POA: Diagnosis not present

## 2017-06-11 DIAGNOSIS — N186 End stage renal disease: Secondary | ICD-10-CM | POA: Diagnosis not present

## 2017-06-11 DIAGNOSIS — D631 Anemia in chronic kidney disease: Secondary | ICD-10-CM | POA: Diagnosis not present

## 2017-06-11 DIAGNOSIS — N2581 Secondary hyperparathyroidism of renal origin: Secondary | ICD-10-CM | POA: Diagnosis not present

## 2017-06-11 DIAGNOSIS — G6181 Chronic inflammatory demyelinating polyneuritis: Secondary | ICD-10-CM

## 2017-06-11 NOTE — Telephone Encounter (Signed)
I called the patient.  Apparently venous access is a problem, if so, a Port-A-Cath can be placed but this will need to be managed appropriately.  The patient likely will need IVIG therapy indefinitely.

## 2017-06-11 NOTE — Telephone Encounter (Signed)
Pt called he is wanting to know if could get an order for a "port" to be put in for IVIG. Homehealth RN said it is hard to find a good vein, they are collapsing. Please call to advise

## 2017-06-11 NOTE — Telephone Encounter (Signed)
I called the patient.  The patient indicates that he is getting IVIG infusions through home health, they are having a lot of problems finding venous access for his IVIG infusions.  He will need to be on these infusions indefinitely, it may be reasonable to get a Port-A-Cath placed.  I will try to get this set up.

## 2017-06-12 ENCOUNTER — Encounter: Payer: Self-pay | Admitting: Family Medicine

## 2017-06-12 ENCOUNTER — Other Ambulatory Visit: Payer: Self-pay | Admitting: Neurology

## 2017-06-12 ENCOUNTER — Ambulatory Visit (INDEPENDENT_AMBULATORY_CARE_PROVIDER_SITE_OTHER): Payer: Medicare Other | Admitting: Family Medicine

## 2017-06-12 VITALS — BP 152/108 | HR 99 | Temp 98.0°F | Ht 74.0 in | Wt 261.0 lb

## 2017-06-12 DIAGNOSIS — F419 Anxiety disorder, unspecified: Secondary | ICD-10-CM | POA: Diagnosis not present

## 2017-06-12 DIAGNOSIS — N186 End stage renal disease: Secondary | ICD-10-CM | POA: Diagnosis not present

## 2017-06-12 DIAGNOSIS — G6181 Chronic inflammatory demyelinating polyneuritis: Secondary | ICD-10-CM | POA: Diagnosis not present

## 2017-06-12 DIAGNOSIS — R1314 Dysphagia, pharyngoesophageal phase: Secondary | ICD-10-CM | POA: Diagnosis not present

## 2017-06-12 DIAGNOSIS — E1165 Type 2 diabetes mellitus with hyperglycemia: Secondary | ICD-10-CM | POA: Diagnosis not present

## 2017-06-12 DIAGNOSIS — Z794 Long term (current) use of insulin: Secondary | ICD-10-CM | POA: Diagnosis not present

## 2017-06-12 DIAGNOSIS — I132 Hypertensive heart and chronic kidney disease with heart failure and with stage 5 chronic kidney disease, or end stage renal disease: Secondary | ICD-10-CM | POA: Diagnosis not present

## 2017-06-12 DIAGNOSIS — Z79891 Long term (current) use of opiate analgesic: Secondary | ICD-10-CM | POA: Diagnosis not present

## 2017-06-12 DIAGNOSIS — F329 Major depressive disorder, single episode, unspecified: Secondary | ICD-10-CM | POA: Diagnosis not present

## 2017-06-12 DIAGNOSIS — Z7982 Long term (current) use of aspirin: Secondary | ICD-10-CM | POA: Diagnosis not present

## 2017-06-12 DIAGNOSIS — E1143 Type 2 diabetes mellitus with diabetic autonomic (poly)neuropathy: Secondary | ICD-10-CM | POA: Diagnosis not present

## 2017-06-12 DIAGNOSIS — Z86718 Personal history of other venous thrombosis and embolism: Secondary | ICD-10-CM | POA: Diagnosis not present

## 2017-06-12 DIAGNOSIS — IMO0002 Reserved for concepts with insufficient information to code with codable children: Secondary | ICD-10-CM

## 2017-06-12 DIAGNOSIS — E1122 Type 2 diabetes mellitus with diabetic chronic kidney disease: Secondary | ICD-10-CM | POA: Diagnosis not present

## 2017-06-12 DIAGNOSIS — Z7951 Long term (current) use of inhaled steroids: Secondary | ICD-10-CM | POA: Diagnosis not present

## 2017-06-12 DIAGNOSIS — Z951 Presence of aortocoronary bypass graft: Secondary | ICD-10-CM | POA: Diagnosis not present

## 2017-06-12 DIAGNOSIS — I2510 Atherosclerotic heart disease of native coronary artery without angina pectoris: Secondary | ICD-10-CM

## 2017-06-12 DIAGNOSIS — I5023 Acute on chronic systolic (congestive) heart failure: Secondary | ICD-10-CM | POA: Diagnosis not present

## 2017-06-12 DIAGNOSIS — Z992 Dependence on renal dialysis: Secondary | ICD-10-CM | POA: Diagnosis not present

## 2017-06-12 DIAGNOSIS — E1151 Type 2 diabetes mellitus with diabetic peripheral angiopathy without gangrene: Secondary | ICD-10-CM | POA: Diagnosis not present

## 2017-06-12 DIAGNOSIS — J45909 Unspecified asthma, uncomplicated: Secondary | ICD-10-CM | POA: Diagnosis not present

## 2017-06-12 DIAGNOSIS — E039 Hypothyroidism, unspecified: Secondary | ICD-10-CM | POA: Diagnosis not present

## 2017-06-12 DIAGNOSIS — D631 Anemia in chronic kidney disease: Secondary | ICD-10-CM | POA: Diagnosis not present

## 2017-06-12 DIAGNOSIS — E1140 Type 2 diabetes mellitus with diabetic neuropathy, unspecified: Secondary | ICD-10-CM | POA: Diagnosis not present

## 2017-06-12 LAB — GLUCOSE, POCT (MANUAL RESULT ENTRY): POC Glucose: 277 mg/dl — AB (ref 70–99)

## 2017-06-12 MED ORDER — LEVOTHYROXINE SODIUM 50 MCG PO TABS: 50.0000 ug | ORAL_TABLET | Freq: Every day | ORAL | 1 refills | Status: DC

## 2017-06-12 MED ORDER — PREGABALIN 75 MG PO CAPS: 75.0000 mg | ORAL_CAPSULE | Freq: Every day | ORAL | 1 refills | Status: AC

## 2017-06-12 MED ORDER — DULOXETINE HCL 30 MG PO CPEP: 30.0000 mg | ORAL_CAPSULE | Freq: Every day | ORAL | 1 refills | Status: DC

## 2017-06-12 MED ORDER — PEN NEEDLES 32G X 4 MM MISC: 1.0000 [IU] | Freq: Every day | 4 refills | Status: AC

## 2017-06-12 NOTE — Progress Notes (Signed)
Subjective:  By signing my name below, I, Essence Howell, attest that this documentation has been prepared under the direction and in the presence of Norberto Sorenson, MD Electronically Signed: Charline Bills, ED Scribe 06/12/2017 at 12:12 PM.   Patient ID: Russell [name], male    DOB: 1970-05-28, 47 y.o.   MRN: [id number]  Chief Complaint  Patient presents with  . Hospitalization Follow-up    Pt is feeling better  . Medication Refill    Cymbalta,synthroid,lyrica   HPI Elam Ellis Pompei "[name]" is a 47 y.o. male who presents to [facility] Care at [facility] for hospital f/u. Pt was admitted 5/19 for gastroparesis, transferred to rehab on 5/24 and discharged on 5/31. Has home PT/OT for gait instability and hand weakness. Just started Lyrica for CIDP. Still taking Cymbalta and hydrocodone for pain which he states is now tolerable. Has tried gabapentin which was ineffective. He has an appointment with GI Salome Arnt in 2 days.  TIIDM Pt has not taken his meds this morning but he did have a glass of orange juice PTA. He has not tried ozempic. Pt saw Dr. Elvera Lennox in April, next appointment in 6 wks.  Hypothyroidism Pt feels well at current dose. Denies any symptoms.  Pt is dialyzed MWF. Wt Readings from Last 3 Encounters:  06/12/17 261 lb (118.4 kg)  06/01/17 257 lb 4.4 oz (116.7 kg)  05/25/17 259 lb 0.7 oz (117.5 kg)    Past Medical History:  Diagnosis Date  . Anxiety   . Asthma   . 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 Daily headache   . Depression   . DVT (deep venous thrombosis), H/o 01/2014-on Xarelto 03/24/2014   LLE  . ESRD (end stage renal disease) on dialysis [facility])    "[place], MWF" (06/29/2016)  . Gait abnormality 12/22/2016  . Gastroparesis 12/22/2016  .  Hypertension   . Hypothyroidism   . Nephrotic syndrome 05/18/2014  . Neuropathy   . Type 2 diabetes mellitus with diabetic nephropathy (HCC)    Current Outpatient Medications on File Prior to Visit  Medication Sig Dispense Refill  . acetaminophen (TYLENOL) 325 MG tablet Take 1-2 tablets (325-650 mg total) by mouth every 4 (four) hours as needed for mild pain.    Marland Kitchen albuterol (PROVENTIL HFA;VENTOLIN HFA) 108 (90 Base) MCG/ACT inhaler Inhale 1 puff into the lungs every 6 (six) hours as needed for wheezing or shortness of breath. (Patient taking differently: Inhale 2 puffs into the lungs every 8 (eight) hours as needed for wheezing or shortness of breath. ) 1 Inhaler 11  . aspirin EC 81 MG EC tablet Take 1 tablet (81 mg total) by mouth daily.    Marland Kitchen atorvastatin (LIPITOR) 80 MG tablet Take 1 tablet (80 mg total) by mouth daily at 6 PM. 90 tablet 3  . calcitRIOL (ROCALTROL) 0.25 MCG capsule Take 5 capsules (1.25 mcg total) by mouth every Monday, Wednesday, and Friday with hemodialysis.    Marland Kitchen cyclobenzaprine (FLEXERIL) 5 MG tablet Take 1 tablet (5 mg total) by mouth 2 (two) times daily as needed for muscle spasms. 30 tablet 0  . DULoxetine (CYMBALTA) 30 MG capsule Take 1 capsule (30 mg total) by mouth daily. 30 capsule 0  . fluticasone (FLONASE) 50 MCG/ACT nasal spray Place 1 spray into both nostrils daily.  1 g 0  . HYDROcodone-acetaminophen (NORCO/VICODIN) 5-325 MG tablet Take 1 tablet by mouth every 12 (twelve) hours as needed for severe pain. 10 tablet 0  . insulin aspart (NOVOLOG FLEXPEN) 100 UNIT/ML FlexPen Inject 8-20 Units into the skin 3 (three) times daily with meals. (Patient taking differently: Inject 8-20 Units into the skin See admin instructions. Sliding scale 5 units per carb.) 15 mL 11  . Insulin Glargine (BASAGLAR KWIKPEN) 100 UNIT/ML SOPN Inject 15 units 2x a day under skin 15 mL 3  . levothyroxine (SYNTHROID, LEVOTHROID) 50 MCG tablet Take 1 tablet (50 mcg total) by mouth daily before  breakfast. 45 tablet 3  . metoCLOPramide (REGLAN) 5 MG tablet Take 1 tablet (5 mg total) by mouth 2 times daily at 12 noon and 4 pm.    . metoprolol succinate (TOPROL-XL) 25 MG 24 hr tablet Take 1.5 tablets (37.5 mg total) by mouth 2 (two) times daily. 90 tablet 0  . pantoprazole (PROTONIX) 40 MG tablet Take 1 tablet (40 mg total) by mouth 2 (two) times daily before a meal. 60 tablet 0  . pregabalin (LYRICA) 75 MG capsule Take 1 capsule (75 mg total) by mouth daily. 30 capsule 0  . sucralfate (CARAFATE) 1 g tablet Take 1 tablet (1 g total) by mouth 4 (four) times daily. (Patient taking differently: Take 1 g by mouth as needed (stomach). ) 60 tablet 0   No current facility-administered medications on file prior to visit.    Allergies  Allergen Reactions  . Nsaids Other (See Comments)    Told to avoid all nsaids due to kidney disease   . Tape Other (See Comments)    Welts result, if left for a long amount of time   Past Surgical History:  Procedure Laterality Date  . ANKLE FRACTURE SURGERY Right 1988  . AV FISTULA PLACEMENT Left 01/01/2015   Procedure: CREATION OF LEFT RADIAL CEPHALIC ARTERIOVENOUS (AV) FISTULA ;  Surgeon: Pryor Ochoa, MD;  Location: [facility] OR;  Service: Vascular;  Laterality: Left;  . BIOPSY  08/07/2017   Procedure: BIOPSY;  Surgeon: Sherrilyn Rist, MD;  Location: [facility];  Service: Gastroenterology;;  . CAPD REMOVAL N/A 05/19/2016   Procedure: PD CATH REMOVAL;  Surgeon: Abigail Miyamoto, MD;  Location: [facility] OR;  Service: General;  Laterality: N/A;  . CARDIAC CATHETERIZATION N/A 09/22/2015   Procedure: Left Heart Cath and Coronary Angiography;  Surgeon: Iran Ouch, MD;  Location: [facility] INVASIVE CV LAB;  Service: Cardiovascular;  Laterality: N/A;  . CORONARY ARTERY BYPASS GRAFT N/A 09/28/2015   Procedure: CORONARY ARTERY BYPASS GRAFTING (CABG) x 3 UTILIZING LEFT MAMMARY ARTERY AND ENDOSCOPICALLY HARVESTED LEFT GREATER SAPHENOUS VEIN.;  Surgeon: Delight Ovens, MD;   Location: [facility] OR;  Service: Open Heart Surgery;  Laterality: N/A;  . ENDOVEIN HARVEST OF GREATER SAPHENOUS VEIN Left 09/28/2015   Procedure: ENDOVEIN HARVEST OF GREATER SAPHENOUS VEIN;  Surgeon: Delight Ovens, MD;  Location[facility] OR;  Service: Open Heart Surgery;  Laterality: Left;  . ESOPHAGOGASTRODUODENOSCOPY N/A 03/29/2014   Procedure: ESOPHAGOGASTRODUODENOSCOPY (EGD);  Surgeon: Dorena Cookey, MD;  Location: [facility];  Service: Endoscopy;  Laterality: N/A;  . ESOPHAGOGASTRODUODENOSCOPY (EGD) WITH PROPOFOL N/A 08/07/2017   Procedure: ESOPHAGOGASTRODUODENOSCOPY (EGD) WITH PROPOFOL;  Surgeon: Sherrilyn Rist, MD;  Location: [facility] ENDOSCOPY;  Service: Gastroenterology;  Laterality: N/A;  . EYE SURGERY    . FRACTURE SURGERY    . INSERTION OF DIALYSIS CATHETER Right 01/05/2015   Procedure: INSERTION OF RIGHT INTERNAL  JUGULAR DIALYSIS CATHETER;  Surgeon: Fransisco Hertz, MD;  Location[facility] OR;  Service: Vascular;  Laterality: Right;  . IR IMAGING GUIDED PORT INSERTION  06/19/2017  . LEFT HEART CATH AND CORS/GRAFTS ANGIOGRAPHY N/A 09/13/2016   Procedure: LEFT HEART CATH AND CORS/GRAFTS ANGIOGRAPHY;  Surgeon: Kathleene Hazel, MD;  Location: [facility] INVASIVE CV LAB;  Service: Cardiovascular;  Laterality: N/A;  . RETINAL LASER PROCEDURE Bilateral   . RIGHT/LEFT HEART CATH AND CORONARY/GRAFT ANGIOGRAPHY N/A 03/06/2017   Procedure: RIGHT/LEFT HEART CATH AND CORONARY/GRAFT ANGIOGRAPHY;  Surgeon: Marykay Lex, MD;  Location: [facility] INVASIVE CV LAB;  Service: Cardiovascular;  Laterality: N/A;  . TEE WITHOUT CARDIOVERSION N/A 09/28/2015   Procedure: TRANSESOPHAGEAL ECHOCARDIOGRAM (TEE);  Surgeon: Delight Ovens, MD;  Location: [facility] OR;  Service: Open Heart Surgery;  Laterality: N/A;  . TONSILLECTOMY AND ADENOIDECTOMY  1970s   Family History  Problem Relation Age of Onset  . Obesity Mother        Patient states that family members have no other medical illnesses other than what I have described  . Kidney cancer  Maternal Grandmother   . Cancer Father 50       AML  . Heart disease Unknown        No family history   Social History   Socioeconomic History  . Marital status: Single    Spouse name: Not on file  . Number of children: 0  . Years of education: College  . Highest education level: Not on file  Occupational History  . Occupation: Disabled    Comment: Data processing manager  Social Needs  . Financial resource strain: Not on file  . Food insecurity:    Worry: Not on file    Inability: Not on file  . Transportation needs:    Medical: Not on file    Non-medical: Not on file  Tobacco Use  . Smoking status: Never Smoker  . Smokeless tobacco: Never Used  Substance and Sexual Activity  . Alcohol use: Yes    Alcohol/week: 0.0 standard drinks    Comment: 06/29/2016 "might have a few drinks/year"  . Drug use: No  . Sexual activity: Yes  Lifestyle  . Physical activity:    Days per week: Not on file    Minutes per session: Not on file  . Stress: Not on file  Relationships  . Social connections:    Talks on phone: Not on file    Gets together: Not on file    Attends religious service: Not on file    Active member of club or organization: Not on file    Attends meetings of clubs or organizations: Not on file    Relationship status: Not on file  Other Topics Concern  . Not on file  Social History Narrative   Patient currently lives with his fiance   Works with facilities   Nonsmoker, nondrinker   Caffeine use: none   Right handed    Depression screen [facility] 2/9 06/12/2017 04/02/2017 08/21/2016 08/17/2016 04/27/2016  Decreased Interest 0 0 0 0 0  Down, Depressed, Hopeless 0 0 0 0 0  PHQ - 2 Score 0 0 0 0 0  Altered sleeping - - - - -  Tired, decreased energy - - - - -  Change in appetite - - - - -  Feeling bad or failure about yourself  - - - - -  Trouble concentrating - - - - -  Moving slowly or fidgety/restless - - - - -  Suicidal thoughts - - - - -  PHQ-9 Score - - - - -    Difficult doing work/chores - - - - -  Some recent data might be hidden    Review of Systems  Constitutional: Negative for fatigue and unexpected weight change.  Endocrine: Negative for cold intolerance and heat intolerance.  Skin: Negative.       Objective:   Physical Exam  Constitutional: He is oriented to person, place, and time. He appears well-developed and well-nourished. No distress.  HENT:  Head: Normocephalic and atraumatic.  Eyes: Conjunctivae and EOM are normal.  Neck: Neck supple. No tracheal deviation present.  Cardiovascular: Regular rhythm. Tachycardia present.  Pulmonary/Chest: Effort normal. No respiratory distress.  Lungs clear. Decreased air movement throughout.  Musculoskeletal: Normal range of motion.  Neurological: He is alert and oriented to person, place, and time.  Skin: Skin is warm and dry.  Psychiatric: He has a normal mood and affect. His behavior is normal.  Nursing note and vitals reviewed.   BP (!) 152/108 (BP Location: Right Arm, Patient Position: Sitting, Cuff Size: Large)   Pulse 99   Temp 98 F (36.7 C) (Oral)   Ht 6\' 2"  (1.88 m)   Wt 261 lb (118.4 kg)   SpO2 95%   BMI 33.51 kg/m     Results for orders placed or performed in visit on 06/12/17  POCT glucose (manual entry)  Result Value Ref Range   POC Glucose 277 (A) 70 - 99 mg/dl   Assessment & Plan:   1. Uncontrolled diabetes mellitus type 2 with peripheral artery disease (HCC)     Orders Placed This Encounter  Procedures  . POCT glucose (manual entry)    Meds ordered this encounter  Medications  . Insulin Pen Needle (PEN NEEDLES) 32G X 4 MM MISC    Sig: 1 Units by Does not apply route 6 (six) times daily. As needed and as directed by physician    Dispense:  540 each    Refill:  4  . levothyroxine (SYNTHROID, LEVOTHROID) 50 MCG tablet    Sig: Take 1 tablet (50 mcg total) by mouth daily before breakfast.    Dispense:  90 tablet    Refill:  1  . pregabalin (LYRICA) 75  MG capsule    Sig: Take 1 capsule (75 mg total) by mouth daily.    Dispense:  90 capsule    Refill:  1  . DULoxetine (CYMBALTA) 30 MG capsule    Sig: Take 1 capsule (30 mg total) by mouth daily.    Dispense:  90 capsule    Refill:  1   I personally performed the services described in this documentation, which was scribed in my presence. The recorded information has been reviewed and considered, and addended by me as needed.   Norberto SorensonEva Link Burgeson, M.D.  [facility] at [facility]  [place] [address] [place], [place] [address] [phone] phone [phone] fax  10/11/17 4:32 AM

## 2017-06-13 ENCOUNTER — Telehealth: Payer: Self-pay | Admitting: Family Medicine

## 2017-06-13 ENCOUNTER — Encounter: Payer: Medicare Other | Admitting: Physical Medicine & Rehabilitation

## 2017-06-13 DIAGNOSIS — N186 End stage renal disease: Secondary | ICD-10-CM | POA: Diagnosis not present

## 2017-06-13 DIAGNOSIS — E1129 Type 2 diabetes mellitus with other diabetic kidney complication: Secondary | ICD-10-CM | POA: Diagnosis not present

## 2017-06-13 DIAGNOSIS — N2581 Secondary hyperparathyroidism of renal origin: Secondary | ICD-10-CM | POA: Diagnosis not present

## 2017-06-13 DIAGNOSIS — D631 Anemia in chronic kidney disease: Secondary | ICD-10-CM | POA: Diagnosis not present

## 2017-06-13 DIAGNOSIS — D509 Iron deficiency anemia, unspecified: Secondary | ICD-10-CM | POA: Diagnosis not present

## 2017-06-13 NOTE — Telephone Encounter (Signed)
Copied from CRM [id number]. Topic: General - Other >> Jun 13, 2017 10:03 AM Gerrianne ScalePayne, Tylynn Braniff L wrote: Reason for CRM: [facility] [phone] stating that he went to go see pt yesterday and pt had chronic BP reading and he took the Hydroxyzine from the hospital and it still didn't come down the reading was 192/92   He also need verbal orders for PT for balance and strength   One time a week for 2 weeks Two times a week for one week  One time a week every other week for one week

## 2017-06-13 NOTE — Telephone Encounter (Signed)
[place] to [place]. Verbal orders given for PT.  He reports patient had no symptoms yesterday, RN will call to check on patient.   Phone call to patient. He states his BP is better today. It was 157/87 at dialysis appointment today. He denies chest pain, visual changes, palpitations but does endorse a headache. He states this is typical after dialysis appointments.   Dr. Clelia CroftShaw, Lorain ChildesFYI.

## 2017-06-14 ENCOUNTER — Encounter: Payer: Self-pay | Admitting: Physician Assistant

## 2017-06-14 ENCOUNTER — Ambulatory Visit (INDEPENDENT_AMBULATORY_CARE_PROVIDER_SITE_OTHER): Payer: Medicare Other | Admitting: Physician Assistant

## 2017-06-14 ENCOUNTER — Telehealth: Payer: Self-pay | Admitting: Family Medicine

## 2017-06-14 VITALS — BP 186/120 | HR 88 | Ht 74.0 in | Wt 260.0 lb

## 2017-06-14 DIAGNOSIS — R1310 Dysphagia, unspecified: Secondary | ICD-10-CM

## 2017-06-14 DIAGNOSIS — I2510 Atherosclerotic heart disease of native coronary artery without angina pectoris: Secondary | ICD-10-CM | POA: Diagnosis not present

## 2017-06-14 DIAGNOSIS — E1143 Type 2 diabetes mellitus with diabetic autonomic (poly)neuropathy: Secondary | ICD-10-CM | POA: Diagnosis not present

## 2017-06-14 DIAGNOSIS — K3184 Gastroparesis: Secondary | ICD-10-CM | POA: Diagnosis not present

## 2017-06-14 DIAGNOSIS — G6181 Chronic inflammatory demyelinating polyneuritis: Secondary | ICD-10-CM | POA: Diagnosis not present

## 2017-06-14 DIAGNOSIS — I5023 Acute on chronic systolic (congestive) heart failure: Secondary | ICD-10-CM | POA: Diagnosis not present

## 2017-06-14 DIAGNOSIS — I132 Hypertensive heart and chronic kidney disease with heart failure and with stage 5 chronic kidney disease, or end stage renal disease: Secondary | ICD-10-CM | POA: Diagnosis not present

## 2017-06-14 DIAGNOSIS — E1122 Type 2 diabetes mellitus with diabetic chronic kidney disease: Secondary | ICD-10-CM | POA: Diagnosis not present

## 2017-06-14 NOTE — Telephone Encounter (Signed)
Verbal given on VM.  

## 2017-06-14 NOTE — Telephone Encounter (Signed)
Copied from CRM [id number]. Topic: General - Other >> Jun 14, 2017  2:48 PM Leafy Roobinson, Norma J wrote: Reason for CRM: mike OT from [facility] needs verbal order for 8 visit

## 2017-06-14 NOTE — Progress Notes (Signed)
Chief Complaint: Dysphagia  HPI:    Russell [name] is a 47 year old male with a past medical history as listed below including CAD (cardiac cath 03/06/2017 with LVEF 25-35%), DVT, ESRD on dialysis and multiple others listed below, who was referred to me by [name], MD for a complaint of dysphagia.      03/29/2014 EGD Dr. Madilyn Fireman with severe erosive esophagitis.    03/08/2017 esophagram showed poor esophageal motility without stricture or mass and mild silent aspiration.    03/09/2017 DG swallowing function with speech pathology.  Findings consistent with esophagram the diet day before.  Recommend continuing regular solids and thin liquid diet with aspiration and esophageal precautions.  Was recommended patient have follow-up with GI.    05/20/2017-05/25/2017 admission for diabetic gastroparesis, pulmonary edema, ESRD on HD and esophageal dysphagia.  At that time it was recommended patient have an EGD outpatient.    05/30/2017 CBC with a hemoglobin of 8.9 around patient's baseline, RBCs at 2.98 and otherwise normal.    Today, patient presents to clinic and explains that he does have a lot of chronic medical condition conditions but his swallowing is "really bothering me".  Patient tells me he feels as though liquids or solids that he eats tend to "swirl in one spot and stop and then go down like a funnel".  This makes it hard to eat and also take his medicine.  He continues with vomiting at least 3-4 times a week at inconsistent times of the day and not necessarily after eating.  He is on Reglan which he takes twice daily since 2016 and is unsure if this is really helping.  Tells me that it "just cannot all be gastroparesis".  Associated symptoms include early satiety.    Denies fever, chills, blood in stool, melena, weight loss, anorexia or symptoms that awaken him at night.  Past Medical History:  Diagnosis Date  . Anxiety   . Asthma   . CIDP (chronic inflammatory demyelinating polyneuropathy) (HCC)  01/10/2017  . 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 Daily headache   . Depression   . DVT (deep venous thrombosis), H/o 01/2014-on Xarelto 03/24/2014   LLE  . ESRD (end stage renal disease) on dialysis [facility])    "[place], MWF" (06/29/2016)  . Gait abnormality 12/22/2016  . Gastroparesis 12/22/2016  . Hypertension   . Hypothyroidism   . Nephrotic syndrome 05/18/2014  . Neuropathy   . Type 2 diabetes mellitus with diabetic nephropathy [facility])     Past Surgical History:  Procedure Laterality Date  . ANKLE FRACTURE SURGERY Right 1988  . AV FISTULA PLACEMENT Left 01/01/2015   Procedure: CREATION OF LEFT RADIAL CEPHALIC ARTERIOVENOUS (AV) FISTULA ;  Surgeon: Pryor Ochoa, MD;  Location: [facility] OR;  Service: Vascular;  Laterality: Left;  . CAPD REMOVAL N/A 05/19/2016   Procedure: PD CATH REMOVAL;  Surgeon: Abigail Miyamoto, MD;  Location: [facility] OR;  Service: General;  Laterality: N/A;  . CARDIAC CATHETERIZATION N/A 09/22/2015   Procedure: Left Heart Cath and Coronary Angiography;  Surgeon: Iran Ouch, MD;  Location: [facility] INVASIVE CV LAB;  Service: Cardiovascular;  Laterality: N/A;  . CORONARY ARTERY BYPASS GRAFT N/A 09/28/2015   Procedure: CORONARY ARTERY BYPASS GRAFTING (CABG) x 3 UTILIZING LEFT MAMMARY ARTERY AND ENDOSCOPICALLY HARVESTED LEFT GREATER SAPHENOUS VEIN.;  Surgeon: Delight Ovens, MD;  Location: [facility] OR;  Service: Open Heart Surgery;  Laterality: N/A;  . ENDOVEIN HARVEST OF GREATER SAPHENOUS VEIN Left 09/28/2015   Procedure: ENDOVEIN HARVEST OF GREATER SAPHENOUS VEIN;  Surgeon: Delight Ovens, MD;  Location: [facility] OR;  Service: Open Heart Surgery;  Laterality: Left;  . ESOPHAGOGASTRODUODENOSCOPY N/A 03/29/2014   Procedure: ESOPHAGOGASTRODUODENOSCOPY (EGD);  Surgeon: Dorena Cookey, MD;  Location: [facility]COPY;  Service: Endoscopy;  Laterality: N/A;    . EYE SURGERY    . FRACTURE SURGERY    . INSERTION OF DIALYSIS CATHETER Right 01/05/2015   Procedure: INSERTION OF RIGHT INTERNAL JUGULAR DIALYSIS CATHETER;  Surgeon: Fransisco Hertz, MD;  Location: [facility] OR;  Service: Vascular;  Laterality: Right;  . LEFT HEART CATH AND CORS/GRAFTS ANGIOGRAPHY N/A 09/13/2016   Procedure: LEFT HEART CATH AND CORS/GRAFTS ANGIOGRAPHY;  Surgeon: Kathleene Hazel, MD;  Location: [facility] INVASIVE CV LAB;  Service: Cardiovascular;  Laterality: N/A;  . RETINAL LASER PROCEDURE Bilateral   . RIGHT/LEFT HEART CATH AND CORONARY/GRAFT ANGIOGRAPHY N/A 03/06/2017   Procedure: RIGHT/LEFT HEART CATH AND CORONARY/GRAFT ANGIOGRAPHY;  Surgeon: Marykay Lex, MD;  Location: [facility] INVASIVE CV LAB;  Service: Cardiovascular;  Laterality: N/A;  . TEE WITHOUT CARDIOVERSION N/A 09/28/2015   Procedure: TRANSESOPHAGEAL ECHOCARDIOGRAM (TEE);  Surgeon: Delight Ovens, MD;  Location: [facility] OR;  Service: Open Heart Surgery;  Laterality: N/A;  . TONSILLECTOMY AND ADENOIDECTOMY  1970s    Current Outpatient Medications  Medication Sig Dispense Refill  . acetaminophen (TYLENOL) 325 MG tablet Take 1-2 tablets (325-650 mg total) by mouth every 4 (four) hours as needed for mild pain.    Marland Kitchen albuterol (PROVENTIL HFA;VENTOLIN HFA) 108 (90 Base) MCG/ACT inhaler Inhale 1 puff into the lungs every 6 (six) hours as needed for wheezing or shortness of breath. (Patient taking differently: Inhale 2 puffs into the lungs every 8 (eight) hours as needed for wheezing or shortness of breath. ) 1 Inhaler 11  . aspirin EC 81 MG EC tablet Take 1 tablet (81 mg total) by mouth daily.    Marland Kitchen atorvastatin (LIPITOR) 80 MG tablet Take 1 tablet (80 mg total) by mouth daily at 6 PM. 90 tablet 3  . calcitRIOL (ROCALTROL) 0.25 MCG capsule Take 5 capsules (1.25 mcg total) by mouth every Monday, Wednesday, and Friday with hemodialysis.    Marland Kitchen cyclobenzaprine (FLEXERIL) 5 MG tablet Take 1 tablet (5 mg total) by mouth 2 (two) times daily as  needed for muscle spasms. 30 tablet 0  . DULoxetine (CYMBALTA) 30 MG capsule Take 1 capsule (30 mg total) by mouth daily. 90 capsule 1  . fluticasone (FLONASE) 50 MCG/ACT nasal spray Place 1 spray into both nostrils daily. 1 g 0  . HYDROcodone-acetaminophen (NORCO/VICODIN) 5-325 MG tablet Take 1 tablet by mouth every 12 (twelve) hours as needed for severe pain. 10 tablet 0  . insulin aspart (NOVOLOG FLEXPEN) 100 UNIT/ML FlexPen Inject 8-20 Units into the skin 3 (three) times daily with meals. (Patient taking differently: Inject 8-20 Units into the skin See admin instructions. Sliding scale 5 units per carb.) 15 mL 11  . Insulin Glargine (BASAGLAR KWIKPEN) 100 UNIT/ML SOPN Inject 15 units 2x a day under skin 15 mL 3  . Insulin Pen Needle (PEN NEEDLES) 32G X 4 MM MISC 1 Units by Does not apply route 6 (six) times daily. As needed and as directed by physician 540 each 4  . levothyroxine (SYNTHROID, LEVOTHROID) 50 MCG tablet Take 1 tablet (50 mcg total) by mouth daily before breakfast. 90  tablet 1  . metoCLOPramide (REGLAN) 5 MG tablet Take 1 tablet (5 mg total) by mouth 2 times daily at 12 noon and 4 pm.    . metoprolol succinate (TOPROL-XL) 25 MG 24 hr tablet Take 1.5 tablets (37.5 mg total) by mouth 2 (two) times daily. 90 tablet 0  . pantoprazole (PROTONIX) 40 MG tablet Take 1 tablet (40 mg total) by mouth 2 (two) times daily before a meal. 60 tablet 0  . pregabalin (LYRICA) 75 MG capsule Take 1 capsule (75 mg total) by mouth daily. 90 capsule 1  . sucralfate (CARAFATE) 1 g tablet Take 1 tablet (1 g total) by mouth 4 (four) times daily. (Patient taking differently: Take 1 g by mouth as needed (stomach). ) 60 tablet 0   No current facility-administered medications for this visit.     Allergies as of 06/14/2017 - Review Complete 06/13/2017  Allergen Reaction Noted  . Nsaids Other (See Comments) 02/13/2015  . Tape Other (See Comments) 04/27/2016    Family History  Problem Relation Age of Onset    . Obesity Mother        Patient states that family members have no other medical illnesses other than what I have described  . Kidney cancer Maternal Grandmother   . Cancer Father 50       AML  . Heart disease Unknown        No family history    Social History   Socioeconomic History  . Marital status: Single    Spouse name: Not on file  . Number of children: 0  . Years of education: College  . Highest education level: Not on file  Occupational History  . Occupation: Disabled    Comment: Data processing manager  Social Needs  . Financial resource strain: Not on file  . Food insecurity:    Worry: Not on file    Inability: Not on file  . Transportation needs:    Medical: Not on file    Non-medical: Not on file  Tobacco Use  . Smoking status: Never Smoker  . Smokeless tobacco: Never Used  Substance and Sexual Activity  . Alcohol use: Yes    Alcohol/week: 0.0 oz    Comment: 06/29/2016 "might have a few drinks/year"  . Drug use: No  . Sexual activity: Yes  Lifestyle  . Physical activity:    Days per week: Not on file    Minutes per session: Not on file  . Stress: Not on file  Relationships  . Social connections:    Talks on phone: Not on file    Gets together: Not on file    Attends religious service: Not on file    Active member of club or organization: Not on file    Attends meetings of clubs or organizations: Not on file    Relationship status: Not on file  . Intimate partner violence:    Fear of current or ex partner: Not on file    Emotionally abused: Not on file    Physically abused: Not on file    Forced sexual activity: Not on file  Other Topics Concern  . Not on file  Social History Narrative   Patient currently lives with his fiance   Works with facilities   Nonsmoker, nondrinker   Caffeine use: none   Right handed     Review of Systems:    Constitutional: No weight loss, fever or chills Skin: No rash  Cardiovascular: No chest pain Respiratory:  No  SOB  Gastrointestinal: See HPI and otherwise negative Genitourinary: No dysuria  Neurological: No headache Musculoskeletal: No new muscle or joint pain Hematologic: No bleeding  Psychiatric: No history of depression or anxiety   Physical Exam:  Vital signs: BP (!) 186/120   Pulse 88   Ht 6\' 2"  (1.88 m)   Wt 260 lb (117.9 kg)   BMI 33.38 kg/m    Constitutional:   Pleasant Caucasian male appears to be in NAD, Well developed, Well nourished, alert and cooperative Head:  Normocephalic and atraumatic. Eyes:   PEERL, EOMI. No icterus. Conjunctiva pink. Ears:  Normal auditory acuity. Neck:  Supple Throat: Oral cavity and pharynx without inflammation, swelling or lesion.  Respiratory: Respirations even and unlabored. Lungs clear to auscultation bilaterally.   No wheezes, crackles, or rhonchi.  Cardiovascular: Normal S1, S2. No MRG. Regular rate and rhythm. No peripheral edema, cyanosis or pallor.  Gastrointestinal:  Soft, nondistended, nontender. No rebound or guarding. Normal bowel sounds. No appreciable masses or hepatomegaly. Rectal:  Not performed.  Msk:  Symmetrical without gross deformities. Without edema, no deformity or joint abnormality.  AV fistula left arm Neurologic:  Alert and  oriented x4;  grossly normal neurologically.  Skin:   Dry and intact without significant lesions or rashes. Psychiatric: Demonstrates good judgement and reason without abnormal affect or behaviors.  RELEVANT LABS AND IMAGING: CBC    Component Value Date/Time   WBC 5.9 05/30/2017 1440   RBC 2.98 (L) 05/30/2017 1440   HGB 8.9 (L) 05/30/2017 1440   HGB 11.1 (L) 09/12/2016 1632   HCT 27.6 (L) 05/30/2017 1440   HCT 33.7 (L) 09/12/2016 1632   PLT 220 05/30/2017 1440   PLT 294 09/12/2016 1632   MCV 92.6 05/30/2017 1440   MCV 87 09/12/2016 1632   MCH 29.9 05/30/2017 1440   MCHC 32.2 05/30/2017 1440   RDW 15.2 05/30/2017 1440   RDW 16.4 (H) 09/12/2016 1632   LYMPHSABS 0.8 05/30/2017 1440    LYMPHSABS 1.2 09/12/2016 1632   MONOABS 0.6 05/30/2017 1440   EOSABS 0.4 05/30/2017 1440   EOSABS 0.3 09/12/2016 1632   BASOSABS 0.0 05/30/2017 1440   BASOSABS 0.1 09/12/2016 1632    CMP     Component Value Date/Time   NA 133 (L) 05/30/2017 1440   NA 134 09/12/2016 1632   K 5.1 05/30/2017 1440   CL 96 (L) 05/30/2017 1440   CO2 25 05/30/2017 1440   GLUCOSE 126 (H) 05/30/2017 1440   BUN 60 (H) 05/30/2017 1440   BUN 47 (H) 09/12/2016 1632   CREATININE 10.15 (H) 05/30/2017 1440   CREATININE 1.56 (H) 05/26/2014 1946   CALCIUM 9.4 05/30/2017 1440   PROT 7.3 05/20/2017 1316   PROT 7.0 12/22/2016 0937   ALBUMIN 3.4 (L) 05/30/2017 1440   ALBUMIN 4.1 08/17/2016 1207   AST 20 05/20/2017 1316   ALT 18 05/20/2017 1316   ALKPHOS 77 05/20/2017 1316   BILITOT 1.1 05/20/2017 1316   BILITOT 0.2 08/17/2016 1207   GFRNONAA 5 (L) 05/30/2017 1440   GFRNONAA 54 (L) 05/26/2014 1946   GFRAA 6 (L) 05/30/2017 1440   GFRAA 62 05/26/2014 1946    Assessment: 1.  Dysphagia: Patient describes feeling as though he has a funnel in his throat, previous esophagram and speech eval during hospitalization in March, see HPI, both recommended GI follow-up with possible EGD; consider esophageal dysmotility +/- stricture versus other 2.  Vomiting: chronic vomiting, has been related to gastroparesis in the past with an uncontrolled  glucose, last hemoglobin A1c was 10 per the patient a few months ago, on Reglan which does not seem to be completely eradicating his symptoms, just started Pantoprazole 2 days ago; consider most likely gastroparesis versus GERD versus other  Plan: 1.  Patient to continue his Reglan twice daily for now.  Did discuss possibly increasing in the future pending EGD results. 2.  Scheduled patient for an EGD at the hospital with Dr. Myrtie Neitheranis due to his decreased EF.  Did discuss risk, benefits, limitations and alternatives and patient agrees to proceed. 3.  Reviewed anti-dysphagia measures  including taking small bites, drinking sips of water in between bites and the chin tuck technique. 4.  Reviewed gastroparesis diet.  Provided him with a handout. 5.  Patient to follow in clinic per recommendations from Dr. Myrtie Neitheranis after time of procedure.  Russell MeekerJennifer Everlie Eble, PA-C [facility] 06/14/2017, 1:35 PM  Cc: Sherren MochaShaw, Eva N, MD

## 2017-06-14 NOTE — Patient Instructions (Signed)
You have been scheduled for an endoscopy. Please follow written instructions given to you at your visit today. If you use inhalers (even only as needed), please bring them with you on the day of your procedure. Your physician has requested that you go to www.startemmi.com and enter the access code given to you at your visit today. This web site gives a general overview about your procedure. However, you should still follow specific instructions given to you by our office regarding your preparation for the procedure.  You will be due for a colonoscopy in 2023. We will send you a reminder in the mail when it gets closer to that time.  We have given you a gastroparesis handout.

## 2017-06-15 DIAGNOSIS — D631 Anemia in chronic kidney disease: Secondary | ICD-10-CM | POA: Diagnosis not present

## 2017-06-15 DIAGNOSIS — E1129 Type 2 diabetes mellitus with other diabetic kidney complication: Secondary | ICD-10-CM | POA: Diagnosis not present

## 2017-06-15 DIAGNOSIS — D509 Iron deficiency anemia, unspecified: Secondary | ICD-10-CM | POA: Diagnosis not present

## 2017-06-15 DIAGNOSIS — N2581 Secondary hyperparathyroidism of renal origin: Secondary | ICD-10-CM | POA: Diagnosis not present

## 2017-06-15 DIAGNOSIS — N186 End stage renal disease: Secondary | ICD-10-CM | POA: Diagnosis not present

## 2017-06-15 NOTE — Progress Notes (Signed)
Thank you for sending this case to me. I have reviewed the entire note, and the outlined plan seems appropriate.  I reviewed the barium study, and this seems like a motility disorder, mostly due to his poorly controlled diabetes. An EGD is reasonable to rule out other (mechanical) causes.  Glucose control is what he needs the most.  Amada JupiterHenry Danis, MD

## 2017-06-18 ENCOUNTER — Other Ambulatory Visit: Payer: Self-pay | Admitting: Radiology

## 2017-06-18 DIAGNOSIS — E1129 Type 2 diabetes mellitus with other diabetic kidney complication: Secondary | ICD-10-CM | POA: Diagnosis not present

## 2017-06-18 DIAGNOSIS — D509 Iron deficiency anemia, unspecified: Secondary | ICD-10-CM | POA: Diagnosis not present

## 2017-06-18 DIAGNOSIS — D631 Anemia in chronic kidney disease: Secondary | ICD-10-CM | POA: Diagnosis not present

## 2017-06-18 DIAGNOSIS — N2581 Secondary hyperparathyroidism of renal origin: Secondary | ICD-10-CM | POA: Diagnosis not present

## 2017-06-18 DIAGNOSIS — N186 End stage renal disease: Secondary | ICD-10-CM | POA: Diagnosis not present

## 2017-06-18 NOTE — Telephone Encounter (Signed)
Noted. Agree w/ PT. thanks

## 2017-06-19 ENCOUNTER — Ambulatory Visit (HOSPITAL_COMMUNITY)
Admission: RE | Admit: 2017-06-19 | Discharge: 2017-06-19 | Disposition: A | Discharge status: Home / Self Care | Payer: Medicare Other | Source: Ambulatory Visit | Attending: Neurology | Admitting: Neurology

## 2017-06-19 ENCOUNTER — Encounter (HOSPITAL_COMMUNITY): Payer: Self-pay

## 2017-06-19 DIAGNOSIS — Z886 Allergy status to analgesic agent status: Secondary | ICD-10-CM | POA: Diagnosis not present

## 2017-06-19 DIAGNOSIS — E1122 Type 2 diabetes mellitus with diabetic chronic kidney disease: Secondary | ICD-10-CM | POA: Diagnosis not present

## 2017-06-19 DIAGNOSIS — G6181 Chronic inflammatory demyelinating polyneuritis: Secondary | ICD-10-CM | POA: Diagnosis not present

## 2017-06-19 DIAGNOSIS — E1143 Type 2 diabetes mellitus with diabetic autonomic (poly)neuropathy: Secondary | ICD-10-CM | POA: Diagnosis not present

## 2017-06-19 DIAGNOSIS — F329 Major depressive disorder, single episode, unspecified: Secondary | ICD-10-CM | POA: Diagnosis not present

## 2017-06-19 DIAGNOSIS — Z9889 Other specified postprocedural states: Secondary | ICD-10-CM | POA: Diagnosis not present

## 2017-06-19 DIAGNOSIS — Z992 Dependence on renal dialysis: Secondary | ICD-10-CM | POA: Insufficient documentation

## 2017-06-19 DIAGNOSIS — Z86718 Personal history of other venous thrombosis and embolism: Secondary | ICD-10-CM | POA: Insufficient documentation

## 2017-06-19 DIAGNOSIS — F419 Anxiety disorder, unspecified: Secondary | ICD-10-CM | POA: Diagnosis not present

## 2017-06-19 DIAGNOSIS — Z794 Long term (current) use of insulin: Secondary | ICD-10-CM | POA: Diagnosis not present

## 2017-06-19 DIAGNOSIS — Z8051 Family history of malignant neoplasm of kidney: Secondary | ICD-10-CM | POA: Insufficient documentation

## 2017-06-19 DIAGNOSIS — E1121 Type 2 diabetes mellitus with diabetic nephropathy: Secondary | ICD-10-CM | POA: Diagnosis not present

## 2017-06-19 DIAGNOSIS — Z79890 Hormone replacement therapy (postmenopausal): Secondary | ICD-10-CM | POA: Insufficient documentation

## 2017-06-19 DIAGNOSIS — I517 Cardiomegaly: Secondary | ICD-10-CM | POA: Diagnosis not present

## 2017-06-19 DIAGNOSIS — Z452 Encounter for adjustment and management of vascular access device: Secondary | ICD-10-CM | POA: Diagnosis not present

## 2017-06-19 DIAGNOSIS — Z79899 Other long term (current) drug therapy: Secondary | ICD-10-CM | POA: Diagnosis not present

## 2017-06-19 DIAGNOSIS — N186 End stage renal disease: Secondary | ICD-10-CM | POA: Diagnosis not present

## 2017-06-19 DIAGNOSIS — Z888 Allergy status to other drugs, medicaments and biological substances status: Secondary | ICD-10-CM | POA: Insufficient documentation

## 2017-06-19 DIAGNOSIS — Z808 Family history of malignant neoplasm of other organs or systems: Secondary | ICD-10-CM | POA: Diagnosis not present

## 2017-06-19 DIAGNOSIS — E039 Hypothyroidism, unspecified: Secondary | ICD-10-CM | POA: Insufficient documentation

## 2017-06-19 DIAGNOSIS — J45909 Unspecified asthma, uncomplicated: Secondary | ICD-10-CM | POA: Insufficient documentation

## 2017-06-19 DIAGNOSIS — I120 Hypertensive chronic kidney disease with stage 5 chronic kidney disease or end stage renal disease: Secondary | ICD-10-CM | POA: Diagnosis not present

## 2017-06-19 DIAGNOSIS — Z951 Presence of aortocoronary bypass graft: Secondary | ICD-10-CM | POA: Insufficient documentation

## 2017-06-19 DIAGNOSIS — Z7951 Long term (current) use of inhaled steroids: Secondary | ICD-10-CM | POA: Diagnosis not present

## 2017-06-19 DIAGNOSIS — I25110 Atherosclerotic heart disease of native coronary artery with unstable angina pectoris: Secondary | ICD-10-CM | POA: Diagnosis not present

## 2017-06-19 DIAGNOSIS — Z7982 Long term (current) use of aspirin: Secondary | ICD-10-CM | POA: Insufficient documentation

## 2017-06-19 HISTORY — PX: IR IMAGING GUIDED PORT INSERTION: IMG5740

## 2017-06-19 LAB — CBC
HEMATOCRIT: 34 % — AB (ref 39.0–52.0)
HEMOGLOBIN: 10.9 g/dL — AB (ref 13.0–17.0)
MCH: 30.1 pg (ref 26.0–34.0)
MCHC: 32.1 g/dL (ref 30.0–36.0)
MCV: 93.9 fL (ref 78.0–100.0)
PLATELETS: 215 10*3/uL (ref 150–400)
RBC: 3.62 MIL/uL — AB (ref 4.22–5.81)
RDW: 15.2 % (ref 11.5–15.5)
WBC: 5.7 10*3/uL (ref 4.0–10.5)

## 2017-06-19 LAB — PROTIME-INR
INR: 1.2
Prothrombin Time: 15.1 seconds (ref 11.4–15.2)

## 2017-06-19 LAB — BASIC METABOLIC PANEL
Anion gap: 14 (ref 5–15)
BUN: 53 mg/dL — ABNORMAL HIGH (ref 6–20)
CHLORIDE: 98 mmol/L — AB (ref 101–111)
CO2: 29 mmol/L (ref 22–32)
Calcium: 8.9 mg/dL (ref 8.9–10.3)
Creatinine, Ser: 8.51 mg/dL — ABNORMAL HIGH (ref 0.61–1.24)
GFR calc non Af Amer: 7 mL/min — ABNORMAL LOW (ref >60)
GFR, EST AFRICAN AMERICAN: 8 mL/min — AB (ref >60)
Glucose, Bld: 219 mg/dL — ABNORMAL HIGH (ref 65–99)
POTASSIUM: 5 mmol/L (ref 3.5–5.1)
SODIUM: 141 mmol/L (ref 135–145)

## 2017-06-19 LAB — GLUCOSE, CAPILLARY: Glucose-Capillary: 194 mg/dL — ABNORMAL HIGH (ref 65–99)

## 2017-06-19 MED ORDER — FENTANYL CITRATE (PF) 100 MCG/2ML IJ SOLN
INTRAMUSCULAR | Status: AC | PRN
  Administered 2017-06-19 (×2): 50 ug via INTRAVENOUS

## 2017-06-19 MED ORDER — CEFAZOLIN SODIUM-DEXTROSE 2-4 GM/100ML-% IV SOLN
INTRAVENOUS | Status: AC
  Administered 2017-06-19: 2 g via INTRAVENOUS
  Filled 2017-06-19: qty 100

## 2017-06-19 MED ORDER — MIDAZOLAM HCL 2 MG/2ML IJ SOLN
INTRAMUSCULAR | Status: AC | PRN
  Administered 2017-06-19 (×2): 1 mg via INTRAVENOUS

## 2017-06-19 MED ORDER — SODIUM CHLORIDE 0.9 % IV SOLN
INTRAVENOUS | Status: AC | PRN
  Administered 2017-06-19: 50 mL/h via INTRAVENOUS

## 2017-06-19 MED ORDER — CEFAZOLIN SODIUM-DEXTROSE 2-4 GM/100ML-% IV SOLN
2.0000 g | INTRAVENOUS | Status: AC
  Administered 2017-06-19: 2 g via INTRAVENOUS

## 2017-06-19 MED ORDER — LIDOCAINE-EPINEPHRINE (PF) 1 %-1:200000 IJ SOLN
INTRAMUSCULAR | Status: AC
  Filled 2017-06-19: qty 30

## 2017-06-19 MED ORDER — MIDAZOLAM HCL 2 MG/2ML IJ SOLN
INTRAMUSCULAR | Status: AC
  Filled 2017-06-19: qty 4

## 2017-06-19 MED ORDER — HEPARIN SOD (PORK) LOCK FLUSH 100 UNIT/ML IV SOLN
INTRAVENOUS | Status: AC | PRN
  Administered 2017-06-19: 500 [IU] via INTRAVENOUS

## 2017-06-19 MED ORDER — FENTANYL CITRATE (PF) 100 MCG/2ML IJ SOLN
INTRAMUSCULAR | Status: AC
  Filled 2017-06-19: qty 4

## 2017-06-19 MED ORDER — LIDOCAINE-EPINEPHRINE (PF) 2 %-1:200000 IJ SOLN
INTRAMUSCULAR | Status: AC | PRN
  Administered 2017-06-19: 10 mL

## 2017-06-19 MED ORDER — HEPARIN SOD (PORK) LOCK FLUSH 100 UNIT/ML IV SOLN
INTRAVENOUS | Status: AC
  Filled 2017-06-19: qty 5

## 2017-06-19 MED ORDER — SODIUM CHLORIDE 0.9 % IV SOLN: INTRAVENOUS | Status: DC

## 2017-06-19 NOTE — H&P (Signed)
Chief Complaint: Patient was seen in consultation today for [facility] a cath placement at the request of Willis,Charles K  Referring Physician(s): Willis,Charles K  Supervising Physician: Ruel Favors  Patient Status: [facility] - Out-pt  History of Present Illness: Russell [name] is a 47 y.o. 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  He will need to be on these infusions indefinitely, it may be reasonable to get a Port-A-Cath placed.  I will try to get this set up.    Past Medical History:  Diagnosis Date  . Anxiety   . Asthma   . 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 Daily headache   . Depression   . DVT (deep venous thrombosis), H/o 01/2014-on Xarelto 03/24/2014   LLE  . ESRD (end stage renal disease) on dialysis [facility])    "[place], MWF" (06/29/2016)  . Gait abnormality 12/22/2016  . Gastroparesis 12/22/2016  . Hypertension   . Hypothyroidism   . Nephrotic syndrome 05/18/2014  . Neuropathy   . Type 2 diabetes mellitus with diabetic nephropathy [facility])     Past Surgical History:  Procedure Laterality Date  . ANKLE FRACTURE SURGERY Right 1988  . AV FISTULA PLACEMENT Left 01/01/2015   Procedure: CREATION OF LEFT RADIAL CEPHALIC ARTERIOVENOUS (AV) FISTULA ;  Surgeon: Pryor Ochoa, MD;  Location: [facility] OR;  Service: Vascular;  Laterality: Left;  . CAPD REMOVAL N/A 05/19/2016   Procedure: PD CATH REMOVAL;  Surgeon: Abigail Miyamoto, MD;  Location: [facility] OR;  Service: General;   Laterality: N/A;  . CARDIAC CATHETERIZATION N/A 09/22/2015   Procedure: Left Heart Cath and Coronary Angiography;  Surgeon: Iran Ouch, MD;  Location: [facility] INVASIVE CV LAB;  Service: Cardiovascular;  Laterality: N/A;  . CORONARY ARTERY BYPASS GRAFT N/A 09/28/2015   Procedure: CORONARY ARTERY BYPASS GRAFTING (CABG) x 3 UTILIZING LEFT MAMMARY ARTERY AND ENDOSCOPICALLY HARVESTED LEFT GREATER SAPHENOUS VEIN.;  Surgeon: Delight Ovens, MD;  Location: [facility] OR;  Service: Open Heart Surgery;  Laterality: N/A;  . ENDOVEIN HARVEST OF GREATER SAPHENOUS VEIN Left 09/28/2015   Procedure: ENDOVEIN HARVEST OF GREATER SAPHENOUS VEIN;  Surgeon: Delight Ovens, MD;  Location: [facility] OR;  Service: Open Heart Surgery;  Laterality: Left;  . ESOPHAGOGASTRODUODENOSCOPY N/A 03/29/2014   Procedure: ESOPHAGOGASTRODUODENOSCOPY (EGD);  Surgeon: Dorena Cookey, MD;  Location: [facility];  Service: Endoscopy;  Laterality: N/A;  . EYE SURGERY    . FRACTURE SURGERY    . INSERTION OF DIALYSIS CATHETER Right 01/05/2015   Procedure: INSERTION OF RIGHT INTERNAL JUGULAR DIALYSIS CATHETER;  Surgeon: Fransisco Hertz, MD;  Location: [facility] OR;  Service: Vascular;  Laterality: Right;  . LEFT HEART CATH AND CORS/GRAFTS ANGIOGRAPHY N/A 09/13/2016   Procedure: LEFT HEART CATH AND CORS/GRAFTS ANGIOGRAPHY;  Surgeon: Kathleene Hazel, MD;  Location: [facility] INVASIVE CV LAB;  Service: Cardiovascular;  Laterality: N/A;  . RETINAL LASER PROCEDURE Bilateral   . RIGHT/LEFT HEART CATH AND CORONARY/GRAFT ANGIOGRAPHY N/A 03/06/2017   Procedure: RIGHT/LEFT HEART CATH AND CORONARY/GRAFT ANGIOGRAPHY;  Surgeon: Marykay Lex, MD;  Location: [facility]  INVASIVE CV LAB;  Service: Cardiovascular;  Laterality: N/A;  . TEE WITHOUT CARDIOVERSION N/A 09/28/2015   Procedure: TRANSESOPHAGEAL ECHOCARDIOGRAM (TEE);  Surgeon: Delight OvensEdward B Gerhardt, MD;  [facility];  Service: Open Heart Surgery;  Laterality: N/A;  . TONSILLECTOMY AND ADENOIDECTOMY  1970s    Allergies: Nsaids and  Tape  Medications: Prior to Admission medications   Medication Sig Start Date End Date Taking? Authorizing Provider  acetaminophen (TYLENOL) 325 MG tablet Take 1-2 tablets (325-650 mg total) by mouth every 4 (four) hours as needed for mild pain. 06/01/17  Yes Love, Evlyn KannerPamela S, PA-C  albuterol (PROVENTIL HFA;VENTOLIN HFA) 108 (90 Base) MCG/ACT inhaler Inhale 1 puff into the lungs every 6 (six) hours as needed for wheezing or shortness of breath. Patient taking differently: Inhale 2 puffs into the lungs every 8 (eight) hours as needed for wheezing or shortness of breath.  03/11/15  Yes Elvina SidleLauenstein, Kurt, MD  aspirin EC 81 MG EC tablet Take 1 tablet (81 mg total) by mouth daily. 10/06/15  Yes Barrett, Erin R, PA-C  atorvastatin (LIPITOR) 80 MG tablet Take 1 tablet (80 mg total) by mouth daily at 6 PM. 01/05/17  Yes Azalee CourseMeng, Hao, PA  calcitRIOL (ROCALTROL) 0.25 MCG capsule Take 5 capsules (1.25 mcg total) by mouth every Monday, Wednesday, and Friday with hemodialysis. 06/01/17  Yes Love, Evlyn KannerPamela S, PA-C  cyclobenzaprine (FLEXERIL) 5 MG tablet Take 1 tablet (5 mg total) by mouth 2 (two) times daily as needed for muscle spasms. 06/01/17  Yes Love, Evlyn KannerPamela S, PA-C  DULoxetine (CYMBALTA) 30 MG capsule Take 1 capsule (30 mg total) by mouth daily. 06/12/17  Yes Sherren MochaShaw, Eva N, MD  fluticasone (FLONASE) 50 MCG/ACT nasal spray Place 1 spray into both nostrils daily. 03/14/17  Yes Briant CedarEzenduka, Nkeiruka J, MD  HYDROcodone-acetaminophen (NORCO/VICODIN) 5-325 MG tablet Take 1 tablet by mouth every 12 (twelve) hours as needed for severe pain. 06/01/17  Yes Love, Evlyn KannerPamela S, PA-C  insulin aspart (NOVOLOG FLEXPEN) 100 UNIT/ML FlexPen Inject 8-20 Units into the skin 3 (three) times daily with meals. Patient taking differently: Inject 8-20 Units into the skin See admin instructions. Sliding scale 5 units per carb. 04/23/17  Yes Carlus PavlovGherghe, Cristina, MD  Insulin Glargine (BASAGLAR KWIKPEN) 100 UNIT/ML SOPN Inject 15 units 2x a day under skin 06/01/17   Yes Love, Evlyn Kanneramela S, PA-C  levothyroxine (SYNTHROID, LEVOTHROID) 50 MCG tablet Take 1 tablet (50 mcg total) by mouth daily before breakfast. 06/12/17  Yes Sherren MochaShaw, Eva N, MD  metoCLOPramide (REGLAN) 5 MG tablet Take 1 tablet (5 mg total) by mouth 2 times daily at 12 noon and 4 pm. Patient taking differently: Take 5 mg by mouth 2 (two) times daily as needed.  05/25/17  Yes Scherrie GerlachHuang, Jennifer, MD  metoprolol succinate (TOPROL-XL) 25 MG 24 hr tablet Take 1.5 tablets (37.5 mg total) by mouth 2 (two) times daily. 06/01/17  Yes Love, Evlyn Kanneramela S, PA-C  pantoprazole (PROTONIX) 40 MG tablet Take 1 tablet (40 mg total) by mouth 2 (two) times daily before a meal. 05/25/17  Yes Scherrie GerlachHuang, Jennifer, MD  pregabalin (LYRICA) 75 MG capsule Take 1 capsule (75 mg total) by mouth daily. 06/12/17  Yes Sherren MochaShaw, Eva N, MD  sucralfate (CARAFATE) 1 g tablet Take 1 tablet (1 g total) by mouth 4 (four) times daily. Patient taking differently: Take 1 g by mouth as needed (stomach).  03/22/17 03/22/18 Yes Drema DallasWoods, Curtis J, MD  Insulin Pen Needle (PEN NEEDLES) 32G X 4 MM MISC 1 Units by Does not  apply route 6 (six) times daily. As needed and as directed by physician 06/12/17   [name], MD     Family History  Problem Relation Age of Onset  . Obesity Mother        Patient states that family members have no other medical illnesses other than what I have described  . Kidney cancer Maternal Grandmother   . Cancer Father 50       AML  . Heart disease Unknown        No family history    Social History   Socioeconomic History  . Marital status: Single    Spouse name: Not on file  . Number of children: 0  . Years of education: College  . Highest education level: Not on file  Occupational History  . Occupation: Disabled    Comment: Data processing manager  Social Needs  . Financial resource strain: Not on file  . Food insecurity:    Worry: Not on file    Inability: Not on file  . Transportation needs:    Medical: Not on file     Non-medical: Not on file  Tobacco Use  . Smoking status: Never Smoker  . Smokeless tobacco: Never Used  Substance and Sexual Activity  . Alcohol use: Yes    Alcohol/week: 0.0 oz    Comment: 06/29/2016 "might have a few drinks/year"  . Drug use: No  . Sexual activity: Yes  Lifestyle  . Physical activity:    Days per week: Not on file    Minutes per session: Not on file  . Stress: Not on file  Relationships  . Social connections:    Talks on phone: Not on file    Gets together: Not on file    Attends religious service: Not on file    Active member of club or organization: Not on file    Attends meetings of clubs or organizations: Not on file    Relationship status: Not on file  Other Topics Concern  . Not on file  Social History Narrative   Patient currently lives with his fiance   Works with facilities   Nonsmoker, nondrinker   Caffeine use: none   Right handed     Review of Systems: A 12 point ROS discussed and pertinent positives are indicated in the HPI above.  All other systems are negative.  Review of Systems  Constitutional: Negative for activity change, fatigue and fever.  Respiratory: Negative for cough and shortness of breath.   Gastrointestinal: Negative for abdominal pain.  Musculoskeletal: Negative for gait problem.  Neurological: Negative for weakness.  Psychiatric/Behavioral: Negative for behavioral problems and confusion.    Vital Signs: BP (!) 147/107 (BP Location: Right Arm)   Pulse 92   Temp 97.7 F (36.5 C) (Oral)   Ht 6\' 2"  (1.88 m)   Wt 255 lb (115.7 kg)   SpO2 99%   BMI 32.74 kg/m   Physical Exam  Constitutional: He is oriented to person, place, and time.  Cardiovascular: Normal rate, regular rhythm and normal heart sounds.  Pulmonary/Chest: Effort normal and breath sounds normal.  Abdominal: Soft. Bowel sounds are normal.  Musculoskeletal: Normal range of motion.  Neurological: He is alert and oriented to person, place, and time.    Skin: Skin is warm and dry.  Psychiatric: He has a normal mood and affect. His behavior is normal. Judgment and thought content normal.  Nursing note and vitals reviewed.   Imaging: Dg Chest [facility] 1 [address]  Result Date: 05/27/2017 CLINICAL DATA:  Shortness of breath. EXAM: PORTABLE CHEST 1 VIEW COMPARISON:  Radiograph of May 20, 2017. FINDINGS: Stable cardiomegaly. Status post coronary artery bypass graft. No pneumothorax or pleural effusion is noted. Both lungs are clear. The visualized skeletal structures are unremarkable. IMPRESSION: No acute cardiopulmonary abnormality seen. Electronically Signed   By: Lupita Raider, M.D.   On: 05/27/2017 10:02   Dg Chest Portable 1 View  Result Date: 05/20/2017 CLINICAL DATA:  Nausea and vomiting since yesterday. Dialysis patient. EXAM: PORTABLE CHEST 1 VIEW COMPARISON:  03/19/2017 FINDINGS: Sternotomy wires are unchanged. Lungs are adequately inflated without focal airspace consolidation or effusion. Minimal prominence of the central perihilar markings which may indicate a mild degree of vascular congestion. Cardiomediastinal silhouette and remainder of the exam is unchanged. IMPRESSION: Possible mild vascular congestion. Electronically Signed   By: Elberta Fortis M.D.   On: 05/20/2017 14:26    Labs:  CBC: Recent Labs    05/23/17 0828 05/25/17 0836 05/28/17 0710 05/30/17 1440  WBC 7.2 8.1 5.3 5.9  HGB 8.0* 9.4* 9.1* 8.9*  HCT 24.9* 29.1* 27.6* 27.6*  PLT 281 258 210 220    COAGS: Recent Labs    09/12/16 1632 03/06/17 0459  INR 1.0 1.18  APTT 26  --     BMP: Recent Labs    05/23/17 0828 05/25/17 0836 05/28/17 0710 05/30/17 1440  NA 133* 129* 133* 133*  K 5.2* 5.0 5.3* 5.1  CL 93* 90* 92* 96*  CO2 26 26 26 25   GLUCOSE 186* 92 169* 126*  BUN 54* 50* 60* 60*  CALCIUM 8.7* 9.2 9.4 9.4  CREATININE 10.34* 9.18* 10.66* 10.15*  GFRNONAA 5* 6* 5* 5*  GFRAA 6* 7* 6* 6*    LIVER FUNCTION TESTS: Recent Labs    10/15/16 0246  12/22/16 0937 03/01/17 1312  03/19/17 1926 05/20/17 1316 05/21/17 0817 05/25/17 0836 05/28/17 0710 05/30/17 1440  BILITOT 0.6  --  0.8  --  1.5* 1.1  --   --   --   --   AST 20  --  21  --  33 20  --   --   --   --   ALT 18  --  14*  --  20 18  --   --   --   --   ALKPHOS 83  --  82  --  98 77  --   --   --   --   PROT 6.7 7.0 7.9  --  9.1* 7.3  --   --   --   --   ALBUMIN 3.3*  --  3.7   < > 4.2 4.0 3.3* 3.6 3.3* 3.4*   < > = values in this interval not displayed.    TUMOR MARKERS: No results for input(s): AFPTM, CEA, CA199, CHROMGRNA in the last 8760 hours.  Assessment and Plan:  CIDP IVIG infusions 2x/mo HHRN IV access problems Now scheduled for PAC placement Risks and benefits of image guided port-a-catheter placement was discussed with the patient including, but not limited to bleeding, infection, pneumothorax, or fibrin sheath development and need for additional procedures.  All of the patient's questions were answered, patient is agreeable to proceed. Consent signed and in chart.   Thank you for this interesting consult.  I greatly enjoyed meeting Russell [name] and look forward to participating in their care.  A copy of this report was sent to the requesting provider on this date.  Electronically Signed: Iya Hamed A, PA-C 06/19/2017, 9:00 AM   I spent a total of  30 Minutes   in face to face in clinical consultation, greater than 50% of which was counseling/coordinating care for [facility]

## 2017-06-19 NOTE — Procedures (Signed)
CIDP  S/p RT IJ PORT TIP SVCRA NO COMP STABLE EBL MIN READY FOR USE FULL REPORT IN PACS

## 2017-06-19 NOTE — Sedation Documentation (Signed)
Patient denies pain and is resting comfortably.  

## 2017-06-19 NOTE — Sedation Documentation (Signed)
Patient is resting comfortably. 

## 2017-06-19 NOTE — Discharge Instructions (Addendum)
Implanted Port Insertion, Care After °This sheet gives you information about how to care for yourself after your procedure. Your health care provider may also give you more specific instructions. If you have problems or questions, contact your health care provider. °What can I expect after the procedure? °After your procedure, it is common to have: °· Discomfort at the port insertion site. °· Bruising on the skin over the port. This should improve over 3-4 days. ° °Follow these instructions at home: °Port care °· After your port is placed, you will get a manufacturer's information card. The card has information about your port. Keep this card with you at all times. °· Take care of the port as told by your health care provider. Ask your health care provider if you or a family member can get training for taking care of the port at home. A home health care nurse may also take care of the port. °· Make sure to remember what type of port you have. °Incision care °· Follow instructions from your health care provider about how to take care of your port insertion site. Make sure you: °? Wash your hands with soap and water before you change your bandage (dressing). If soap and water are not available, use hand sanitizer. °? Change your dressing as told by your health care provider. °? Leave stitches (sutures), skin glue, or adhesive strips in place. These skin closures may need to stay in place for 2 weeks or longer. If adhesive strip edges start to loosen and curl up, you may trim the loose edges. Do not remove adhesive strips completely unless your health care provider tells you to do that. °· Check your port insertion site every day for signs of infection. Check for: °? More redness, swelling, or pain. °? More fluid or blood. °? Warmth. °? Pus or a bad smell. °General instructions °· Do not take baths, swim, or use a hot tub until your health care provider approves. °· Do not lift anything that is heavier than 10 lb (4.5  kg) for a week, or as told by your health care provider. °· Ask your health care provider when it is okay to: °? Return to work or school. °? Resume usual physical activities or sports. °· Do not drive for 24 hours if you were given a medicine to help you relax (sedative). °· Take over-the-counter and prescription medicines only as told by your health care provider. °· Wear a medical alert bracelet in case of an emergency. This will tell any health care providers that you have a port. °· Keep all follow-up visits as told by your health care provider. This is important. °Contact a health care provider if: °· You cannot flush your port with saline as directed, or you cannot draw blood from the port. °· You have a fever or chills. °· You have more redness, swelling, or pain around your port insertion site. °· You have more fluid or blood coming from your port insertion site. °· Your port insertion site feels warm to the touch. °· You have pus or a bad smell coming from the port insertion site. °Get help right away if: °· You have chest pain or shortness of breath. °· You have bleeding from your port that you cannot control. °Summary °· Take care of the port as told by your health care provider. °· Change your dressing as told by your health care provider. °· Keep all follow-up visits as told by your health care provider. °  This information is not intended to replace advice given to you by your health care provider. Make sure you discuss any questions you have with your health care provider. °Document Released: 10/09/2012 Document Revised: 11/10/2015 Document Reviewed: 11/10/2015 °Elsevier Interactive Patient Education © 2017 [employer] °Moderate Conscious Sedation, Adult, Care After °These instructions provide you with information about caring for yourself after your procedure. Your health care provider may also give you more specific instructions. Your treatment has been planned according to current medical  practices, but problems sometimes occur. Call your health care provider if you have any problems or questions after your procedure. °What can I expect after the procedure? °After your procedure, it is common: °· To feel sleepy for several hours. °· To feel clumsy and have poor balance for several hours. °· To have poor judgment for several hours. °· To vomit if you eat too soon. ° °Follow these instructions at home: °For at least 24 hours after the procedure: ° °· Do not: °? Participate in activities where you could fall or become injured. °? Drive. °? Use heavy machinery. °? Drink alcohol. °? Take sleeping pills or medicines that cause drowsiness. °? Make important decisions or sign legal documents. °? Take care of children on your own. °· Rest. °Eating and drinking °· Follow the diet recommended by your health care provider. °· If you vomit: °? Drink water, juice, or soup when you can drink without vomiting. °? Make sure you have little or no nausea before eating solid foods. °General instructions °· Have a responsible adult stay with you until you are awake and alert. °· Take over-the-counter and prescription medicines only as told by your health care provider. °· If you smoke, do not smoke without supervision. °· Keep all follow-up visits as told by your health care provider. This is important. °Contact a health care provider if: °· You keep feeling nauseous or you keep vomiting. °· You feel light-headed. °· You develop a rash. °· You have a fever. °Get help right away if: °· You have trouble breathing. °This information is not intended to replace advice given to you by your health care provider. Make sure you discuss any questions you have with your health care provider. °Document Released: 10/09/2012 Document Revised: 05/24/2015 Document Reviewed: 04/10/2015 °Elsevier Interactive Patient Education © 2018 Elsevier Inc. ° °

## 2017-06-20 ENCOUNTER — Telehealth: Payer: Self-pay | Admitting: *Deleted

## 2017-06-20 DIAGNOSIS — D631 Anemia in chronic kidney disease: Secondary | ICD-10-CM | POA: Diagnosis not present

## 2017-06-20 DIAGNOSIS — N186 End stage renal disease: Secondary | ICD-10-CM | POA: Diagnosis not present

## 2017-06-20 DIAGNOSIS — E1129 Type 2 diabetes mellitus with other diabetic kidney complication: Secondary | ICD-10-CM | POA: Diagnosis not present

## 2017-06-20 DIAGNOSIS — N2581 Secondary hyperparathyroidism of renal origin: Secondary | ICD-10-CM | POA: Diagnosis not present

## 2017-06-20 DIAGNOSIS — D509 Iron deficiency anemia, unspecified: Secondary | ICD-10-CM | POA: Diagnosis not present

## 2017-06-20 NOTE — Telephone Encounter (Signed)
[facility]: "lidocaine 2.5% prilocaine 2.5% 30gm. Apply small amount to power port area 15-30 min prior to Privigen infusion 30gm. PRN refills up to one year" Fax: [phone]. Received fax confirmation.

## 2017-06-21 DIAGNOSIS — E1143 Type 2 diabetes mellitus with diabetic autonomic (poly)neuropathy: Secondary | ICD-10-CM | POA: Diagnosis not present

## 2017-06-21 DIAGNOSIS — E1122 Type 2 diabetes mellitus with diabetic chronic kidney disease: Secondary | ICD-10-CM | POA: Diagnosis not present

## 2017-06-21 DIAGNOSIS — I132 Hypertensive heart and chronic kidney disease with heart failure and with stage 5 chronic kidney disease, or end stage renal disease: Secondary | ICD-10-CM | POA: Diagnosis not present

## 2017-06-21 DIAGNOSIS — I5023 Acute on chronic systolic (congestive) heart failure: Secondary | ICD-10-CM | POA: Diagnosis not present

## 2017-06-21 DIAGNOSIS — G6181 Chronic inflammatory demyelinating polyneuritis: Secondary | ICD-10-CM | POA: Diagnosis not present

## 2017-06-21 DIAGNOSIS — I2510 Atherosclerotic heart disease of native coronary artery without angina pectoris: Secondary | ICD-10-CM | POA: Diagnosis not present

## 2017-06-22 DIAGNOSIS — D509 Iron deficiency anemia, unspecified: Secondary | ICD-10-CM | POA: Diagnosis not present

## 2017-06-22 DIAGNOSIS — N186 End stage renal disease: Secondary | ICD-10-CM | POA: Diagnosis not present

## 2017-06-22 DIAGNOSIS — N2581 Secondary hyperparathyroidism of renal origin: Secondary | ICD-10-CM | POA: Diagnosis not present

## 2017-06-22 DIAGNOSIS — D631 Anemia in chronic kidney disease: Secondary | ICD-10-CM | POA: Diagnosis not present

## 2017-06-22 DIAGNOSIS — E1129 Type 2 diabetes mellitus with other diabetic kidney complication: Secondary | ICD-10-CM | POA: Diagnosis not present

## 2017-06-25 DIAGNOSIS — N2581 Secondary hyperparathyroidism of renal origin: Secondary | ICD-10-CM | POA: Diagnosis not present

## 2017-06-25 DIAGNOSIS — D509 Iron deficiency anemia, unspecified: Secondary | ICD-10-CM | POA: Diagnosis not present

## 2017-06-25 DIAGNOSIS — N186 End stage renal disease: Secondary | ICD-10-CM | POA: Diagnosis not present

## 2017-06-25 DIAGNOSIS — E1129 Type 2 diabetes mellitus with other diabetic kidney complication: Secondary | ICD-10-CM | POA: Diagnosis not present

## 2017-06-25 DIAGNOSIS — D631 Anemia in chronic kidney disease: Secondary | ICD-10-CM | POA: Diagnosis not present

## 2017-06-26 DIAGNOSIS — I2510 Atherosclerotic heart disease of native coronary artery without angina pectoris: Secondary | ICD-10-CM | POA: Diagnosis not present

## 2017-06-26 DIAGNOSIS — I132 Hypertensive heart and chronic kidney disease with heart failure and with stage 5 chronic kidney disease, or end stage renal disease: Secondary | ICD-10-CM | POA: Diagnosis not present

## 2017-06-26 DIAGNOSIS — E1122 Type 2 diabetes mellitus with diabetic chronic kidney disease: Secondary | ICD-10-CM | POA: Diagnosis not present

## 2017-06-26 DIAGNOSIS — G6181 Chronic inflammatory demyelinating polyneuritis: Secondary | ICD-10-CM | POA: Diagnosis not present

## 2017-06-26 DIAGNOSIS — E1143 Type 2 diabetes mellitus with diabetic autonomic (poly)neuropathy: Secondary | ICD-10-CM | POA: Diagnosis not present

## 2017-06-26 DIAGNOSIS — I5023 Acute on chronic systolic (congestive) heart failure: Secondary | ICD-10-CM | POA: Diagnosis not present

## 2017-06-27 ENCOUNTER — Ambulatory Visit: Payer: Medicare Other | Admitting: Gastroenterology

## 2017-06-27 DIAGNOSIS — D509 Iron deficiency anemia, unspecified: Secondary | ICD-10-CM | POA: Diagnosis not present

## 2017-06-27 DIAGNOSIS — E113591 Type 2 diabetes mellitus with proliferative diabetic retinopathy without macular edema, right eye: Secondary | ICD-10-CM | POA: Diagnosis not present

## 2017-06-27 DIAGNOSIS — D631 Anemia in chronic kidney disease: Secondary | ICD-10-CM | POA: Diagnosis not present

## 2017-06-27 DIAGNOSIS — N2581 Secondary hyperparathyroidism of renal origin: Secondary | ICD-10-CM | POA: Diagnosis not present

## 2017-06-27 DIAGNOSIS — N186 End stage renal disease: Secondary | ICD-10-CM | POA: Diagnosis not present

## 2017-06-27 DIAGNOSIS — E1129 Type 2 diabetes mellitus with other diabetic kidney complication: Secondary | ICD-10-CM | POA: Diagnosis not present

## 2017-06-27 DIAGNOSIS — H4311 Vitreous hemorrhage, right eye: Secondary | ICD-10-CM | POA: Diagnosis not present

## 2017-06-28 DIAGNOSIS — E1143 Type 2 diabetes mellitus with diabetic autonomic (poly)neuropathy: Secondary | ICD-10-CM | POA: Diagnosis not present

## 2017-06-28 DIAGNOSIS — G6181 Chronic inflammatory demyelinating polyneuritis: Secondary | ICD-10-CM | POA: Diagnosis not present

## 2017-06-28 DIAGNOSIS — E1122 Type 2 diabetes mellitus with diabetic chronic kidney disease: Secondary | ICD-10-CM | POA: Diagnosis not present

## 2017-06-28 DIAGNOSIS — I2510 Atherosclerotic heart disease of native coronary artery without angina pectoris: Secondary | ICD-10-CM | POA: Diagnosis not present

## 2017-06-28 DIAGNOSIS — I132 Hypertensive heart and chronic kidney disease with heart failure and with stage 5 chronic kidney disease, or end stage renal disease: Secondary | ICD-10-CM | POA: Diagnosis not present

## 2017-06-28 DIAGNOSIS — I5023 Acute on chronic systolic (congestive) heart failure: Secondary | ICD-10-CM | POA: Diagnosis not present

## 2017-06-29 DIAGNOSIS — D631 Anemia in chronic kidney disease: Secondary | ICD-10-CM | POA: Diagnosis not present

## 2017-06-29 DIAGNOSIS — E1129 Type 2 diabetes mellitus with other diabetic kidney complication: Secondary | ICD-10-CM | POA: Diagnosis not present

## 2017-06-29 DIAGNOSIS — N2581 Secondary hyperparathyroidism of renal origin: Secondary | ICD-10-CM | POA: Diagnosis not present

## 2017-06-29 DIAGNOSIS — N186 End stage renal disease: Secondary | ICD-10-CM | POA: Diagnosis not present

## 2017-06-29 DIAGNOSIS — D509 Iron deficiency anemia, unspecified: Secondary | ICD-10-CM | POA: Diagnosis not present

## 2017-07-02 DIAGNOSIS — E875 Hyperkalemia: Secondary | ICD-10-CM | POA: Diagnosis not present

## 2017-07-02 DIAGNOSIS — G6181 Chronic inflammatory demyelinating polyneuritis: Secondary | ICD-10-CM | POA: Diagnosis not present

## 2017-07-02 DIAGNOSIS — D631 Anemia in chronic kidney disease: Secondary | ICD-10-CM | POA: Diagnosis not present

## 2017-07-02 DIAGNOSIS — I132 Hypertensive heart and chronic kidney disease with heart failure and with stage 5 chronic kidney disease, or end stage renal disease: Secondary | ICD-10-CM | POA: Diagnosis not present

## 2017-07-02 DIAGNOSIS — E1129 Type 2 diabetes mellitus with other diabetic kidney complication: Secondary | ICD-10-CM | POA: Diagnosis not present

## 2017-07-02 DIAGNOSIS — Z992 Dependence on renal dialysis: Secondary | ICD-10-CM | POA: Diagnosis not present

## 2017-07-02 DIAGNOSIS — I2510 Atherosclerotic heart disease of native coronary artery without angina pectoris: Secondary | ICD-10-CM | POA: Diagnosis not present

## 2017-07-02 DIAGNOSIS — N049 Nephrotic syndrome with unspecified morphologic changes: Secondary | ICD-10-CM | POA: Diagnosis not present

## 2017-07-02 DIAGNOSIS — N2581 Secondary hyperparathyroidism of renal origin: Secondary | ICD-10-CM | POA: Diagnosis not present

## 2017-07-02 DIAGNOSIS — E1122 Type 2 diabetes mellitus with diabetic chronic kidney disease: Secondary | ICD-10-CM | POA: Diagnosis not present

## 2017-07-02 DIAGNOSIS — N186 End stage renal disease: Secondary | ICD-10-CM | POA: Diagnosis not present

## 2017-07-02 DIAGNOSIS — E1143 Type 2 diabetes mellitus with diabetic autonomic (poly)neuropathy: Secondary | ICD-10-CM | POA: Diagnosis not present

## 2017-07-02 DIAGNOSIS — I5023 Acute on chronic systolic (congestive) heart failure: Secondary | ICD-10-CM | POA: Diagnosis not present

## 2017-07-02 DIAGNOSIS — D509 Iron deficiency anemia, unspecified: Secondary | ICD-10-CM | POA: Diagnosis not present

## 2017-07-04 DIAGNOSIS — D631 Anemia in chronic kidney disease: Secondary | ICD-10-CM | POA: Diagnosis not present

## 2017-07-04 DIAGNOSIS — I5023 Acute on chronic systolic (congestive) heart failure: Secondary | ICD-10-CM | POA: Diagnosis not present

## 2017-07-04 DIAGNOSIS — D509 Iron deficiency anemia, unspecified: Secondary | ICD-10-CM | POA: Diagnosis not present

## 2017-07-04 DIAGNOSIS — I2510 Atherosclerotic heart disease of native coronary artery without angina pectoris: Secondary | ICD-10-CM | POA: Diagnosis not present

## 2017-07-04 DIAGNOSIS — I132 Hypertensive heart and chronic kidney disease with heart failure and with stage 5 chronic kidney disease, or end stage renal disease: Secondary | ICD-10-CM | POA: Diagnosis not present

## 2017-07-04 DIAGNOSIS — N2581 Secondary hyperparathyroidism of renal origin: Secondary | ICD-10-CM | POA: Diagnosis not present

## 2017-07-04 DIAGNOSIS — E1122 Type 2 diabetes mellitus with diabetic chronic kidney disease: Secondary | ICD-10-CM | POA: Diagnosis not present

## 2017-07-04 DIAGNOSIS — E875 Hyperkalemia: Secondary | ICD-10-CM | POA: Diagnosis not present

## 2017-07-04 DIAGNOSIS — N186 End stage renal disease: Secondary | ICD-10-CM | POA: Diagnosis not present

## 2017-07-04 DIAGNOSIS — G6181 Chronic inflammatory demyelinating polyneuritis: Secondary | ICD-10-CM | POA: Diagnosis not present

## 2017-07-04 DIAGNOSIS — E1143 Type 2 diabetes mellitus with diabetic autonomic (poly)neuropathy: Secondary | ICD-10-CM | POA: Diagnosis not present

## 2017-07-04 DIAGNOSIS — E1129 Type 2 diabetes mellitus with other diabetic kidney complication: Secondary | ICD-10-CM | POA: Diagnosis not present

## 2017-07-05 DIAGNOSIS — I5023 Acute on chronic systolic (congestive) heart failure: Secondary | ICD-10-CM | POA: Diagnosis not present

## 2017-07-05 DIAGNOSIS — I132 Hypertensive heart and chronic kidney disease with heart failure and with stage 5 chronic kidney disease, or end stage renal disease: Secondary | ICD-10-CM | POA: Diagnosis not present

## 2017-07-05 DIAGNOSIS — E1122 Type 2 diabetes mellitus with diabetic chronic kidney disease: Secondary | ICD-10-CM | POA: Diagnosis not present

## 2017-07-05 DIAGNOSIS — E1143 Type 2 diabetes mellitus with diabetic autonomic (poly)neuropathy: Secondary | ICD-10-CM | POA: Diagnosis not present

## 2017-07-05 DIAGNOSIS — I2510 Atherosclerotic heart disease of native coronary artery without angina pectoris: Secondary | ICD-10-CM | POA: Diagnosis not present

## 2017-07-05 DIAGNOSIS — G6181 Chronic inflammatory demyelinating polyneuritis: Secondary | ICD-10-CM | POA: Diagnosis not present

## 2017-07-06 DIAGNOSIS — E875 Hyperkalemia: Secondary | ICD-10-CM | POA: Diagnosis not present

## 2017-07-06 DIAGNOSIS — D509 Iron deficiency anemia, unspecified: Secondary | ICD-10-CM | POA: Diagnosis not present

## 2017-07-06 DIAGNOSIS — N2581 Secondary hyperparathyroidism of renal origin: Secondary | ICD-10-CM | POA: Diagnosis not present

## 2017-07-06 DIAGNOSIS — N186 End stage renal disease: Secondary | ICD-10-CM | POA: Diagnosis not present

## 2017-07-06 DIAGNOSIS — D631 Anemia in chronic kidney disease: Secondary | ICD-10-CM | POA: Diagnosis not present

## 2017-07-06 DIAGNOSIS — E1129 Type 2 diabetes mellitus with other diabetic kidney complication: Secondary | ICD-10-CM | POA: Diagnosis not present

## 2017-07-09 DIAGNOSIS — H2513 Age-related nuclear cataract, bilateral: Secondary | ICD-10-CM | POA: Diagnosis not present

## 2017-07-09 DIAGNOSIS — D631 Anemia in chronic kidney disease: Secondary | ICD-10-CM | POA: Diagnosis not present

## 2017-07-09 DIAGNOSIS — D509 Iron deficiency anemia, unspecified: Secondary | ICD-10-CM | POA: Diagnosis not present

## 2017-07-09 DIAGNOSIS — E1139 Type 2 diabetes mellitus with other diabetic ophthalmic complication: Secondary | ICD-10-CM | POA: Diagnosis not present

## 2017-07-09 DIAGNOSIS — N2581 Secondary hyperparathyroidism of renal origin: Secondary | ICD-10-CM | POA: Diagnosis not present

## 2017-07-09 DIAGNOSIS — E875 Hyperkalemia: Secondary | ICD-10-CM | POA: Diagnosis not present

## 2017-07-09 DIAGNOSIS — N186 End stage renal disease: Secondary | ICD-10-CM | POA: Diagnosis not present

## 2017-07-09 DIAGNOSIS — H2511 Age-related nuclear cataract, right eye: Secondary | ICD-10-CM | POA: Diagnosis not present

## 2017-07-09 DIAGNOSIS — E1129 Type 2 diabetes mellitus with other diabetic kidney complication: Secondary | ICD-10-CM | POA: Diagnosis not present

## 2017-07-10 DIAGNOSIS — I2510 Atherosclerotic heart disease of native coronary artery without angina pectoris: Secondary | ICD-10-CM | POA: Diagnosis not present

## 2017-07-10 DIAGNOSIS — E1143 Type 2 diabetes mellitus with diabetic autonomic (poly)neuropathy: Secondary | ICD-10-CM | POA: Diagnosis not present

## 2017-07-10 DIAGNOSIS — G6181 Chronic inflammatory demyelinating polyneuritis: Secondary | ICD-10-CM | POA: Diagnosis not present

## 2017-07-10 DIAGNOSIS — I5023 Acute on chronic systolic (congestive) heart failure: Secondary | ICD-10-CM | POA: Diagnosis not present

## 2017-07-10 DIAGNOSIS — E1122 Type 2 diabetes mellitus with diabetic chronic kidney disease: Secondary | ICD-10-CM | POA: Diagnosis not present

## 2017-07-10 DIAGNOSIS — I132 Hypertensive heart and chronic kidney disease with heart failure and with stage 5 chronic kidney disease, or end stage renal disease: Secondary | ICD-10-CM | POA: Diagnosis not present

## 2017-07-11 DIAGNOSIS — E875 Hyperkalemia: Secondary | ICD-10-CM | POA: Diagnosis not present

## 2017-07-11 DIAGNOSIS — N2581 Secondary hyperparathyroidism of renal origin: Secondary | ICD-10-CM | POA: Diagnosis not present

## 2017-07-11 DIAGNOSIS — N186 End stage renal disease: Secondary | ICD-10-CM | POA: Diagnosis not present

## 2017-07-11 DIAGNOSIS — G6181 Chronic inflammatory demyelinating polyneuritis: Secondary | ICD-10-CM | POA: Diagnosis not present

## 2017-07-11 DIAGNOSIS — E1143 Type 2 diabetes mellitus with diabetic autonomic (poly)neuropathy: Secondary | ICD-10-CM | POA: Diagnosis not present

## 2017-07-11 DIAGNOSIS — I132 Hypertensive heart and chronic kidney disease with heart failure and with stage 5 chronic kidney disease, or end stage renal disease: Secondary | ICD-10-CM | POA: Diagnosis not present

## 2017-07-11 DIAGNOSIS — I2510 Atherosclerotic heart disease of native coronary artery without angina pectoris: Secondary | ICD-10-CM | POA: Diagnosis not present

## 2017-07-11 DIAGNOSIS — I5023 Acute on chronic systolic (congestive) heart failure: Secondary | ICD-10-CM | POA: Diagnosis not present

## 2017-07-11 DIAGNOSIS — E1129 Type 2 diabetes mellitus with other diabetic kidney complication: Secondary | ICD-10-CM | POA: Diagnosis not present

## 2017-07-11 DIAGNOSIS — D509 Iron deficiency anemia, unspecified: Secondary | ICD-10-CM | POA: Diagnosis not present

## 2017-07-11 DIAGNOSIS — D631 Anemia in chronic kidney disease: Secondary | ICD-10-CM | POA: Diagnosis not present

## 2017-07-11 DIAGNOSIS — E1122 Type 2 diabetes mellitus with diabetic chronic kidney disease: Secondary | ICD-10-CM | POA: Diagnosis not present

## 2017-07-12 DIAGNOSIS — E1143 Type 2 diabetes mellitus with diabetic autonomic (poly)neuropathy: Secondary | ICD-10-CM | POA: Diagnosis not present

## 2017-07-12 DIAGNOSIS — E1122 Type 2 diabetes mellitus with diabetic chronic kidney disease: Secondary | ICD-10-CM | POA: Diagnosis not present

## 2017-07-12 DIAGNOSIS — I132 Hypertensive heart and chronic kidney disease with heart failure and with stage 5 chronic kidney disease, or end stage renal disease: Secondary | ICD-10-CM | POA: Diagnosis not present

## 2017-07-12 DIAGNOSIS — G6181 Chronic inflammatory demyelinating polyneuritis: Secondary | ICD-10-CM | POA: Diagnosis not present

## 2017-07-12 DIAGNOSIS — I5023 Acute on chronic systolic (congestive) heart failure: Secondary | ICD-10-CM | POA: Diagnosis not present

## 2017-07-12 DIAGNOSIS — I2510 Atherosclerotic heart disease of native coronary artery without angina pectoris: Secondary | ICD-10-CM | POA: Diagnosis not present

## 2017-07-13 ENCOUNTER — Telehealth: Payer: Self-pay | Admitting: Family Medicine

## 2017-07-13 DIAGNOSIS — D631 Anemia in chronic kidney disease: Secondary | ICD-10-CM | POA: Diagnosis not present

## 2017-07-13 DIAGNOSIS — E1129 Type 2 diabetes mellitus with other diabetic kidney complication: Secondary | ICD-10-CM | POA: Diagnosis not present

## 2017-07-13 DIAGNOSIS — N2581 Secondary hyperparathyroidism of renal origin: Secondary | ICD-10-CM | POA: Diagnosis not present

## 2017-07-13 DIAGNOSIS — D509 Iron deficiency anemia, unspecified: Secondary | ICD-10-CM | POA: Diagnosis not present

## 2017-07-13 DIAGNOSIS — E875 Hyperkalemia: Secondary | ICD-10-CM | POA: Diagnosis not present

## 2017-07-13 DIAGNOSIS — N186 End stage renal disease: Secondary | ICD-10-CM | POA: Diagnosis not present

## 2017-07-13 NOTE — Telephone Encounter (Signed)
Copied from CRM [id number]. Topic: Quick Communication - See Telephone Encounter >> Jul 13, 2017  2:41 PM Arlyss Gandyichardson, Jeret Goyer N, NT wrote: CRM for notification. See Telephone encounter for: 07/13/17. Kathlene NovemberMike with [facility] calling to get orders for OT for this pt for 3 times a week for 2 weeks starting the week after next. CB#: [phone]

## 2017-07-15 NOTE — Telephone Encounter (Signed)
LMOVM with OT orders for pt [facility] Kathlene NovemberMike

## 2017-07-16 DIAGNOSIS — D631 Anemia in chronic kidney disease: Secondary | ICD-10-CM | POA: Diagnosis not present

## 2017-07-16 DIAGNOSIS — E875 Hyperkalemia: Secondary | ICD-10-CM | POA: Diagnosis not present

## 2017-07-16 DIAGNOSIS — D509 Iron deficiency anemia, unspecified: Secondary | ICD-10-CM | POA: Diagnosis not present

## 2017-07-16 DIAGNOSIS — N2581 Secondary hyperparathyroidism of renal origin: Secondary | ICD-10-CM | POA: Diagnosis not present

## 2017-07-16 DIAGNOSIS — E1129 Type 2 diabetes mellitus with other diabetic kidney complication: Secondary | ICD-10-CM | POA: Diagnosis not present

## 2017-07-16 DIAGNOSIS — N186 End stage renal disease: Secondary | ICD-10-CM | POA: Diagnosis not present

## 2017-07-18 DIAGNOSIS — N186 End stage renal disease: Secondary | ICD-10-CM | POA: Diagnosis not present

## 2017-07-18 DIAGNOSIS — E875 Hyperkalemia: Secondary | ICD-10-CM | POA: Diagnosis not present

## 2017-07-18 DIAGNOSIS — D509 Iron deficiency anemia, unspecified: Secondary | ICD-10-CM | POA: Diagnosis not present

## 2017-07-18 DIAGNOSIS — E1129 Type 2 diabetes mellitus with other diabetic kidney complication: Secondary | ICD-10-CM | POA: Diagnosis not present

## 2017-07-18 DIAGNOSIS — N2581 Secondary hyperparathyroidism of renal origin: Secondary | ICD-10-CM | POA: Diagnosis not present

## 2017-07-18 DIAGNOSIS — D631 Anemia in chronic kidney disease: Secondary | ICD-10-CM | POA: Diagnosis not present

## 2017-07-20 DIAGNOSIS — N186 End stage renal disease: Secondary | ICD-10-CM | POA: Diagnosis not present

## 2017-07-20 DIAGNOSIS — D631 Anemia in chronic kidney disease: Secondary | ICD-10-CM | POA: Diagnosis not present

## 2017-07-20 DIAGNOSIS — E875 Hyperkalemia: Secondary | ICD-10-CM | POA: Diagnosis not present

## 2017-07-20 DIAGNOSIS — N2581 Secondary hyperparathyroidism of renal origin: Secondary | ICD-10-CM | POA: Diagnosis not present

## 2017-07-20 DIAGNOSIS — E1129 Type 2 diabetes mellitus with other diabetic kidney complication: Secondary | ICD-10-CM | POA: Diagnosis not present

## 2017-07-20 DIAGNOSIS — D509 Iron deficiency anemia, unspecified: Secondary | ICD-10-CM | POA: Diagnosis not present

## 2017-07-23 ENCOUNTER — Ambulatory Visit (INDEPENDENT_AMBULATORY_CARE_PROVIDER_SITE_OTHER): Payer: Medicare Other | Admitting: Sports Medicine

## 2017-07-23 ENCOUNTER — Encounter: Payer: Self-pay | Admitting: Sports Medicine

## 2017-07-23 DIAGNOSIS — N186 End stage renal disease: Secondary | ICD-10-CM | POA: Diagnosis not present

## 2017-07-23 DIAGNOSIS — E875 Hyperkalemia: Secondary | ICD-10-CM | POA: Diagnosis not present

## 2017-07-23 DIAGNOSIS — E1142 Type 2 diabetes mellitus with diabetic polyneuropathy: Secondary | ICD-10-CM | POA: Diagnosis not present

## 2017-07-23 DIAGNOSIS — I739 Peripheral vascular disease, unspecified: Secondary | ICD-10-CM

## 2017-07-23 DIAGNOSIS — M7989 Other specified soft tissue disorders: Secondary | ICD-10-CM | POA: Diagnosis not present

## 2017-07-23 DIAGNOSIS — D631 Anemia in chronic kidney disease: Secondary | ICD-10-CM | POA: Diagnosis not present

## 2017-07-23 DIAGNOSIS — L309 Dermatitis, unspecified: Secondary | ICD-10-CM | POA: Diagnosis not present

## 2017-07-23 DIAGNOSIS — D509 Iron deficiency anemia, unspecified: Secondary | ICD-10-CM | POA: Diagnosis not present

## 2017-07-23 DIAGNOSIS — I2510 Atherosclerotic heart disease of native coronary artery without angina pectoris: Secondary | ICD-10-CM

## 2017-07-23 DIAGNOSIS — E1129 Type 2 diabetes mellitus with other diabetic kidney complication: Secondary | ICD-10-CM | POA: Diagnosis not present

## 2017-07-23 DIAGNOSIS — N2581 Secondary hyperparathyroidism of renal origin: Secondary | ICD-10-CM | POA: Diagnosis not present

## 2017-07-23 MED ORDER — HYDROCORTISONE 0.5 % EX CREA: 1.0000 "application " | TOPICAL_CREAM | Freq: Two times a day (BID) | CUTANEOUS | 0 refills | Status: DC

## 2017-07-23 NOTE — Progress Notes (Addendum)
Subjective: Russell [name] is a 47 y.o. male patient with history of diabetes who presents to office today complaining of discoloration to all toes and lower legs that has worsened over the last 2 weeks. Reports that his dialysis nurse pointed it out and he wanted to have it checked. Reports that his blood sugar this morning was 152. A1c 10. Admits burning, numbness, stinging, swelling and temperature changes at night and itching. Reports that he has compression stockings but does not use them due to difficulty of getting them on. Denies any other pedal complaints.  Review of Systems  Cardiovascular: Positive for leg swelling.  Skin: Positive for itching.       Color change to skin  Neurological: Positive for tingling and sensory change.  All other systems reviewed and are negative.    Patient Active Problem List   Diagnosis Date Noted  . Dry eye syndrome of both eyes   . ESRD on dialysis (HCC)   . Labile blood glucose   . Benign essential HTN   . Neuropathic pain   . Debility 05/25/2017  . Diabetes mellitus type 2 in obese (HCC)   . Diabetic peripheral neuropathy (HCC)   . Diabetic gastroparesis (HCC)   . Hypothyroidism   . Anemia of chronic disease   . Acute blood loss anemia   . Abnormal EKG   . Dysphagia   . Supplemental oxygen dependent   . Pulmonary edema 05/20/2017  . Chronic pain syndrome 04/23/2017  . Chronic peripheral neuropathic pain 04/23/2017  . DKA (diabetic ketoacidoses) (HCC) 03/19/2017  . CAP (community acquired pneumonia) 03/01/2017  . Anxiety 02/06/2017  . Asthma 02/06/2017  . 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 Hyperlipidemia 02/06/2017  . Macular degeneration 02/06/2017  . Neuropathy 02/06/2017  . Restrictive airway disease 02/06/2017  . 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 Quadriparesis (HCC) 12/22/2016  . Gait abnormality 12/22/2016  . Hypertensive urgency 10/13/2016  . Acute pulmonary edema (HCC)   . Anemia in  chronic kidney disease, on chronic dialysis (HCC)   . Weakness   . Hyperkalemia 09/26/2016  . History of DVT of lower extremity 08/17/2016  . Arteriosclerotic vascular disease 08/17/2016  . DKA, type 1 (HCC) 06/29/2016  . Nausea vomiting and diarrhea 06/29/2016  . Malignant hypertension 06/29/2016  . Hypoxemic respiratory failure, chronic (HCC) 05/16/2016  . Anemia due to end stage renal disease (HCC) 05/16/2016  . Pre-transplant evaluation for kidney transplant 04/25/2016  . S/P CABG (coronary artery bypass graft) 09/28/2015  . Coronary artery disease involving native coronary artery of native heart without angina pectoris   . ESRD (end stage renal disease) (HCC) 02/13/2015  . Nausea and vomiting 02/13/2015  . Nephrotic syndrome 01/03/2015  . Essential hypertension, benign   . Diabetic ketoacidosis without coma associated with type 2 diabetes mellitus (HCC)   . Hx of gastroesophageal reflux (GERD) 09/30/2014  . Diabetic neuropathy (HCC) 09/21/2014  . Uncontrolled diabetes mellitus type 2 with peripheral artery disease (HCC) 09/21/2014  . Morbid obesity (HCC) 09/21/2014  . Proteinuria 07/09/2014  . Chest pain 04/22/2014  . Superficial thrombophlebitis 03/24/2014  . Hypothyroidism (acquired) 03/24/2014   Current Outpatient Medications on File Prior to Visit  Medication Sig Dispense Refill  . acetaminophen (TYLENOL) 325 MG tablet Take 1-2 tablets (325-650 mg total) by mouth every 4 (four) hours as needed for mild pain.    Marland Kitchen. albuterol (PROVENTIL HFA;VENTOLIN HFA) 108 (90 Base) MCG/ACT inhaler Inhale 1 puff into the lungs every 6 (six) hours as  needed for wheezing or shortness of breath. (Patient taking differently: Inhale 2 puffs into the lungs every 8 (eight) hours as needed for wheezing or shortness of breath. ) 1 Inhaler 11  . aspirin EC 81 MG EC tablet Take 1 tablet (81 mg total) by mouth daily.    Marland Kitchen atorvastatin (LIPITOR) 80 MG tablet Take 1 tablet (80 mg total) by mouth daily at 6  PM. 90 tablet 3  . calcitRIOL (ROCALTROL) 0.25 MCG capsule Take 5 capsules (1.25 mcg total) by mouth every Monday, Wednesday, and Friday with hemodialysis.    Marland Kitchen cyclobenzaprine (FLEXERIL) 5 MG tablet Take 1 tablet (5 mg total) by mouth 2 (two) times daily as needed for muscle spasms. 30 tablet 0  . DULoxetine (CYMBALTA) 30 MG capsule Take 1 capsule (30 mg total) by mouth daily. 90 capsule 1  . fluticasone (FLONASE) 50 MCG/ACT nasal spray Place 1 spray into both nostrils daily. 1 g 0  . HYDROcodone-acetaminophen (NORCO/VICODIN) 5-325 MG tablet Take 1 tablet by mouth every 12 (twelve) hours as needed for severe pain. 10 tablet 0  . insulin aspart (NOVOLOG FLEXPEN) 100 UNIT/ML FlexPen Inject 8-20 Units into the skin 3 (three) times daily with meals. (Patient taking differently: Inject 8-20 Units into the skin See admin instructions. Sliding scale 5 units per carb.) 15 mL 11  . Insulin Glargine (BASAGLAR KWIKPEN) 100 UNIT/ML SOPN Inject 15 units 2x a day under skin 15 mL 3  . Insulin Pen Needle (PEN NEEDLES) 32G X 4 MM MISC 1 Units by Does not apply route 6 (six) times daily. As needed and as directed by physician 540 each 4  . levothyroxine (SYNTHROID, LEVOTHROID) 50 MCG tablet Take 1 tablet (50 mcg total) by mouth daily before breakfast. 90 tablet 1  . metoCLOPramide (REGLAN) 5 MG tablet Take 1 tablet (5 mg total) by mouth 2 times daily at 12 noon and 4 pm. (Patient taking differently: Take 5 mg by mouth 2 (two) times daily as needed. )    . metoprolol succinate (TOPROL-XL) 25 MG 24 hr tablet Take 1.5 tablets (37.5 mg total) by mouth 2 (two) times daily. 90 tablet 0  . pantoprazole (PROTONIX) 40 MG tablet Take 1 tablet (40 mg total) by mouth 2 (two) times daily before a meal. 60 tablet 0  . pregabalin (LYRICA) 75 MG capsule Take 1 capsule (75 mg total) by mouth daily. 90 capsule 1  . sucralfate (CARAFATE) 1 g tablet Take 1 tablet (1 g total) by mouth 4 (four) times daily. (Patient taking differently:  Take 1 g by mouth as needed (stomach). ) 60 tablet 0   No current facility-administered medications on file prior to visit.    Allergies  Allergen Reactions  . Nsaids Other (See Comments)    Told to avoid all nsaids due to kidney disease   . Tape Other (See Comments)    Welts result, if left for a long amount of time      Objective: General: Patient is awake, alert, and oriented x 3 and in no acute distress.  Integument: Skin is warm, dry and supple bilateral. Nails are short, dystrophic with subungual debris, consistent with onychomycosis, 1-5 bilateral. No signs of infection. No open lesions or preulcerative lesions present bilateral. Subjective itching bilateral. Remaining integument unremarkable.   Vasculature:  Dorsalis Pedis pulse 1/4 bilateral. Posterior Tibial pulse  0/4 bilateral. Capillary fill time <5 sec 1-5 bilateral. No hair growth to the level of the digits.Temperature gradient decreased. ++ varicosities present bilateral. +  1 pitting edema present bilateral. Venous hyperpigmentation bilateral.   Neurology: The patient has diminished sensation measured with a 5.07/10g Semmes Weinstein Monofilament at all pedal sites bilateral. Vibratory sensation diminished bilateral with tuning fork. No Babinski sign present bilateral.   Musculoskeletal: No symptomatic pedal deformities noted bilateral. Muscular strength 4/5 in all lower extremity muscular groups bilateral without pain on range of motion. No tenderness with calf compression bilateral.  Assessment and Plan: Problem List Items Addressed This Visit      Endocrine   Diabetic peripheral neuropathy (HCC)    Other Visit Diagnoses    PVD (peripheral vascular disease) (HCC)    -  Primary   Swelling of both lower extremities       Dermatitis       Relevant Medications   hydrocortisone cream 0.5 %     -Examined patient. -Discussed and educated patient on diabetic foot care, especially with  regards to the vascular,  neurological and musculoskeletal systems.  -Stressed the importance of good glycemic control and the detriment of not  controlling glucose levels in relation to the foot. -Rx Cortisone cream for legs -Recommend elevation and compression stockings  -ABIs normal -Patient to return  in 2.5 to 3 months for at risk foot care -Patient advised to call the office if any problems or questions arise in the meantime.  Asencion Islam, DPM

## 2017-07-23 NOTE — Progress Notes (Signed)
   Subjective:    Patient ID: Russell [name], male    DOB: [birth year], 47 y.o.   MRN: [id number]  HPI    Review of Systems  All other systems reviewed and are negative.      Objective:   Physical Exam        Assessment & Plan:

## 2017-07-24 DIAGNOSIS — I132 Hypertensive heart and chronic kidney disease with heart failure and with stage 5 chronic kidney disease, or end stage renal disease: Secondary | ICD-10-CM | POA: Diagnosis not present

## 2017-07-24 DIAGNOSIS — I5023 Acute on chronic systolic (congestive) heart failure: Secondary | ICD-10-CM | POA: Diagnosis not present

## 2017-07-24 DIAGNOSIS — E1122 Type 2 diabetes mellitus with diabetic chronic kidney disease: Secondary | ICD-10-CM | POA: Diagnosis not present

## 2017-07-24 DIAGNOSIS — I2510 Atherosclerotic heart disease of native coronary artery without angina pectoris: Secondary | ICD-10-CM | POA: Diagnosis not present

## 2017-07-24 DIAGNOSIS — E1143 Type 2 diabetes mellitus with diabetic autonomic (poly)neuropathy: Secondary | ICD-10-CM | POA: Diagnosis not present

## 2017-07-24 DIAGNOSIS — G6181 Chronic inflammatory demyelinating polyneuritis: Secondary | ICD-10-CM | POA: Diagnosis not present

## 2017-07-25 DIAGNOSIS — N2581 Secondary hyperparathyroidism of renal origin: Secondary | ICD-10-CM | POA: Diagnosis not present

## 2017-07-25 DIAGNOSIS — E1129 Type 2 diabetes mellitus with other diabetic kidney complication: Secondary | ICD-10-CM | POA: Diagnosis not present

## 2017-07-25 DIAGNOSIS — D509 Iron deficiency anemia, unspecified: Secondary | ICD-10-CM | POA: Diagnosis not present

## 2017-07-25 DIAGNOSIS — N186 End stage renal disease: Secondary | ICD-10-CM | POA: Diagnosis not present

## 2017-07-25 DIAGNOSIS — E875 Hyperkalemia: Secondary | ICD-10-CM | POA: Diagnosis not present

## 2017-07-25 DIAGNOSIS — D631 Anemia in chronic kidney disease: Secondary | ICD-10-CM | POA: Diagnosis not present

## 2017-07-26 ENCOUNTER — Encounter: Payer: Self-pay | Admitting: Internal Medicine

## 2017-07-26 ENCOUNTER — Ambulatory Visit (INDEPENDENT_AMBULATORY_CARE_PROVIDER_SITE_OTHER): Payer: Medicare Other | Admitting: Internal Medicine

## 2017-07-26 VITALS — BP 130/90 | HR 83 | Ht 74.0 in | Wt 257.8 lb

## 2017-07-26 DIAGNOSIS — E1165 Type 2 diabetes mellitus with hyperglycemia: Secondary | ICD-10-CM | POA: Diagnosis not present

## 2017-07-26 DIAGNOSIS — E785 Hyperlipidemia, unspecified: Secondary | ICD-10-CM

## 2017-07-26 DIAGNOSIS — E1142 Type 2 diabetes mellitus with diabetic polyneuropathy: Secondary | ICD-10-CM | POA: Diagnosis not present

## 2017-07-26 DIAGNOSIS — IMO0002 Reserved for concepts with insufficient information to code with codable children: Secondary | ICD-10-CM

## 2017-07-26 DIAGNOSIS — E1151 Type 2 diabetes mellitus with diabetic peripheral angiopathy without gangrene: Secondary | ICD-10-CM

## 2017-07-26 DIAGNOSIS — I2510 Atherosclerotic heart disease of native coronary artery without angina pectoris: Secondary | ICD-10-CM | POA: Diagnosis not present

## 2017-07-26 DIAGNOSIS — E039 Hypothyroidism, unspecified: Secondary | ICD-10-CM

## 2017-07-26 LAB — T4, FREE: FREE T4: 1.19 ng/dL (ref 0.60–1.60)

## 2017-07-26 LAB — POCT GLYCOSYLATED HEMOGLOBIN (HGB A1C): HEMOGLOBIN A1C: 9.1 % — AB (ref 4.0–5.6)

## 2017-07-26 LAB — TSH: TSH: 5.03 u[IU]/mL — ABNORMAL HIGH (ref 0.35–4.50)

## 2017-07-26 MED ORDER — LEVOTHYROXINE SODIUM 75 MCG PO TABS: 75.0000 ug | ORAL_TABLET | Freq: Every day | ORAL | 3 refills | Status: AC

## 2017-07-26 NOTE — Patient Instructions (Signed)
Please stop at the lab.  Please continue: - Lantus/Basaglar 30 units 2x a day  Please increase: - Novolog 12-15 units before a smaller meal - Novolog 20-23 units before a larger meal  Please return in 3 months with your sugar log.

## 2017-07-26 NOTE — Progress Notes (Signed)
Patient ID: Russell [name], male   DOB: [birth year], 47 y.o.   MRN: [id number]  HPI: Russell [name] is a 47 y.o.-year-old male, returning for f/u for DM2, dx in 2011, insulin-dependent 2014, uncontrolled, with complications (DR, PN, CAD - 3x CABG 2017, ESRD - HD MWF, h/o DKA). Last visit 3 mo ago.  Since last visit, he was admitted again 05/20/2017 for nausea and vomiting.  He has diabetes gastroparesis and also had pulmonary edema at that time.  Last hemoglobin A1c was: 04/2017: HbA1c 10% Lab Results  Component Value Date   HGBA1C 11.9 01/05/2017   HGBA1C 10.8 (H) 10/14/2016   HGBA1C 12.8 (H) 05/17/2016   He is currently on: - Lantus/Basaglar 30 units 2x a day - Novolog Smaller meal: 7-10 units Larger meal: 15-20 units Prev. On Victoza. We tried Ozempic 0.5 mg weekly - started 04/2017 >> but exacerbated his gastroparesis >> stopped.  Pt checks his sugars  At least 4x a day: - am: 250-300, 500 >> 180-220 >> 160-170 - 2h after b'fast: 250, 266 >> n/c - before lunch: 162 >> 300s >> 160s >> 150s - 2h after lunch: 83-330 >> 218, 246 >> n/c - before dinner:  200s >> 180-190 >> 160-180 - 2h after dinner: 151, 201, 236 >> n/c - bedtime: 88-199 >> 94 >> n/c - nighttime: n/c Lowest sugar was 110 >> 120; he has hypoglycemia awareness in the 70s. Highest sugar was 300 >> 200.  Glucometer: [employer], physiological.  Pt's meals are: - Breakfast: eggs, sausage, bacon, toast - Lunch: salad - Dinner: meat + veggies - Snacks: meat, cheese rolls  - + ESRD, on HD: Lab Results  Component Value Date   BUN 53 (H) 06/19/2017   CREATININE 8.51 (H) 06/19/2017   -+ HL; last set of lipids: Lab Results  Component Value Date   CHOL 134 03/21/2017   HDL 40 (L) 03/21/2017   LDLCALC 71 03/21/2017   TRIG 113 03/21/2017   CHOLHDL 3.4 03/21/2017  On Lipitor 80.  Previously, Zocor >>  Irritability. - last eye exam was in 12/2016: + DR.  Dr Allyne Gee.He had eye surgery in 2019 (R eye). He will have cataract  sx's.  - + Numbness and tingling in his feet.  On Neurontin.  He has a h/o horseshoe kidney.  He has hypothyroidism: - dx in 2015  He was prev. On LT4 50 mcg daily >> stopped before  I saw him last, but restarted after last visit.   Last TSH was slsightly high, off LT4: Lab Results  Component Value Date   TSH 5.25 (H) 04/23/2017   He has CIDP >> numbness in fingers >> painful CBG sticks.   He is on hemodialysis.  Peritoneal dialysis did not work well for him.  Since last visit, he had an episode of DKA on 03/19/2017 after a large volume dialysis, previously, he was hospitalized with pneumonia on 03/01/2017.  ROS: Constitutional: no weight gain/no weight loss, no fatigue, no subjective hyperthermia, no subjective hypothermia Eyes: no blurry vision, no xerophthalmia ENT: no sore throat, no nodules palpated in throat, no dysphagia, no odynophagia, no hoarseness Cardiovascular: no CP/no SOB/no palpitations/no leg swelling Respiratory: no cough/no SOB/no wheezing Gastrointestinal: no N/no V/no D/no C/no acid reflux Musculoskeletal: no muscle aches/no joint aches Skin: no rashes, no hair loss Neurological: no tremors/+ numbness/+ tingling/no dizziness  I reviewed pt's medications, allergies, PMH, social hx, family hx, and changes were documented in the history of present illness. Otherwise, unchanged from my  initial visit note.  Past Medical History:  Diagnosis Date  . Anxiety   . Asthma   . 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 Daily headache   . Depression   . DVT (deep venous thrombosis), H/o 01/2014-on Xarelto 03/24/2014   LLE  . ESRD (end stage renal disease) on dialysis [facility](HCC)    "[place], MWF" (06/29/2016)  . Gait abnormality 12/22/2016  . Gastroparesis  12/22/2016  . Hypertension   . Hypothyroidism   . Nephrotic syndrome 05/18/2014  . Neuropathy   . Type 2 diabetes mellitus with diabetic nephropathy [facility](HCC)    Past Surgical History:  Procedure Laterality Date  . ANKLE FRACTURE SURGERY Right 1988  . AV FISTULA PLACEMENT Left 01/01/2015   Procedure: CREATION OF LEFT RADIAL CEPHALIC ARTERIOVENOUS (AV) FISTULA ;  Surgeon: Pryor OchoaJames D Lawson, MD;  [facility];  Service: Vascular;  Laterality: Left;  . CAPD REMOVAL N/A 05/19/2016   Procedure: PD CATH REMOVAL;  Surgeon: Abigail MiyamotoBlackman, Douglas, MD;  [facility];  Service: General;  Laterality: N/A;  . CARDIAC CATHETERIZATION N/A 09/22/2015   Procedure: Left Heart Cath and Coronary Angiography;  Surgeon: Iran OuchMuhammad A Arida, MD;  Location: [facility] INVASIVE CV LAB;  Service: Cardiovascular;  Laterality: N/A;  . CORONARY ARTERY BYPASS GRAFT N/A 09/28/2015   Procedure: CORONARY ARTERY BYPASS GRAFTING (CABG) x 3 UTILIZING LEFT MAMMARY ARTERY AND ENDOSCOPICALLY HARVESTED LEFT GREATER SAPHENOUS VEIN.;  Surgeon: Delight OvensEdward B Gerhardt, MD;  Location: [facility] OR;  Service: Open Heart Surgery;  Laterality: N/A;  . ENDOVEIN HARVEST OF GREATER SAPHENOUS VEIN Left 09/28/2015   Procedure: ENDOVEIN HARVEST OF GREATER SAPHENOUS VEIN;  Surgeon: Delight OvensEdward B Gerhardt, MD;  [facility];  Service: Open Heart Surgery;  Laterality: Left;  . ESOPHAGOGASTRODUODENOSCOPY N/A 03/29/2014   Procedure: ESOPHAGOGASTRODUODENOSCOPY (EGD);  Surgeon: Dorena CookeyJohn Hayes, MD;  [facility];  Service: Endoscopy;  Laterality: N/A;  . EYE SURGERY    . FRACTURE SURGERY    . INSERTION OF DIALYSIS CATHETER Right 01/05/2015   Procedure: INSERTION OF RIGHT INTERNAL JUGULAR DIALYSIS CATHETER;  Surgeon: Fransisco HertzBrian L Chen, MD;  Location: [facility] OR;  Service: Vascular;  Laterality: Right;  . IR IMAGING GUIDED PORT INSERTION  06/19/2017  . LEFT HEART CATH AND CORS/GRAFTS ANGIOGRAPHY N/A 09/13/2016   Procedure: LEFT HEART CATH AND CORS/GRAFTS ANGIOGRAPHY;  Surgeon: Kathleene HazelMcAlhany,  Christopher D, MD;  Location: [facility] INVAS[facility] CV LAB;  Service: Cardiovascular;  Laterality: N/A;  . RETINAL LASER PROCEDURE Bilateral   . RIGHT/LEFT HEART CATH AND CORONARY/GRAFT ANGIOGRAPHY N/A 03/06/2017   Procedure: RIGHT/LEFT HEART CATH AND CORONARY/GRAFT ANGIOGRAPHY;  Surgeon: Marykay LexHarding, David W, MD;  [facility];  Service: Cardiovascular;  Laterality: N/A;  . TEE WITHOUT CARDIOVERSION N/A 09/28/2015   Procedure: TRANSESOPHAGEAL ECHOCARDIOGRAM (TEE);  Surgeon: Delight OvensEdward B Gerhardt, MD;  [facility];  Service: Open Heart Surgery;  Laterality: N/A;  . TONSILLECTOMY AND ADENOIDECTOMY  1970s   History   Social History  . Marital Status: Single    Spouse Name: N/A  . Number of Children: no   Occupational History  .      Maintenance [employer]   Social History Main Topics  . Smoking status: Never Smoker   . Smokeless tobacco: Not on file  . Alcohol Use: No  . Drug Use: No  . Sexual  Activity: Yes   Social History Narrative   Patient currently lives with his fiance   Works with facilities   Nonsmoker, nondrinker   Current Outpatient Medications on File Prior to Visit  Medication Sig Dispense Refill  . acetaminophen (TYLENOL) 325 MG tablet Take 1-2 tablets (325-650 mg total) by mouth every 4 (four) hours as needed for mild pain.    Marland Kitchen albuterol (PROVENTIL HFA;VENTOLIN HFA) 108 (90 Base) MCG/ACT inhaler Inhale 1 puff into the lungs every 6 (six) hours as needed for wheezing or shortness of breath. (Patient taking differently: Inhale 2 puffs into the lungs every 8 (eight) hours as needed for wheezing or shortness of breath. ) 1 Inhaler 11  . aspirin EC 81 MG EC tablet Take 1 tablet (81 mg total) by mouth daily.    Marland Kitchen atorvastatin (LIPITOR) 80 MG tablet Take 1 tablet (80 mg total) by mouth daily at 6 PM. 90 tablet 3  . calcitRIOL (ROCALTROL) 0.25 MCG capsule Take 5 capsules (1.25 mcg total) by mouth every Monday, Wednesday, and Friday with hemodialysis.    Marland Kitchen cyclobenzaprine  (FLEXERIL) 5 MG tablet Take 1 tablet (5 mg total) by mouth 2 (two) times daily as needed for muscle spasms. 30 tablet 0  . DULoxetine (CYMBALTA) 30 MG capsule Take 1 capsule (30 mg total) by mouth daily. 90 capsule 1  . fluticasone (FLONASE) 50 MCG/ACT nasal spray Place 1 spray into both nostrils daily. 1 g 0  . HYDROcodone-acetaminophen (NORCO/VICODIN) 5-325 MG tablet Take 1 tablet by mouth every 12 (twelve) hours as needed for severe pain. 10 tablet 0  . hydrocortisone cream 0.5 % Apply 1 application topically 2 (two) times daily. 30 g 0  . insulin aspart (NOVOLOG FLEXPEN) 100 UNIT/ML FlexPen Inject 8-20 Units into the skin 3 (three) times daily with meals. (Patient taking differently: Inject 8-20 Units into the skin See admin instructions. Sliding scale 5 units per carb.) 15 mL 11  . Insulin Glargine (BASAGLAR KWIKPEN) 100 UNIT/ML SOPN Inject 15 units 2x a day under skin 15 mL 3  . Insulin Pen Needle (PEN NEEDLES) 32G X 4 MM MISC 1 Units by Does not apply route 6 (six) times daily. As needed and as directed by physician 540 each 4  . levothyroxine (SYNTHROID, LEVOTHROID) 50 MCG tablet Take 1 tablet (50 mcg total) by mouth daily before breakfast. 90 tablet 1  . metoCLOPramide (REGLAN) 5 MG tablet Take 1 tablet (5 mg total) by mouth 2 times daily at 12 noon and 4 pm. (Patient taking differently: Take 5 mg by mouth 2 (two) times daily as needed. )    . metoprolol succinate (TOPROL-XL) 25 MG 24 hr tablet Take 1.5 tablets (37.5 mg total) by mouth 2 (two) times daily. 90 tablet 0  . pantoprazole (PROTONIX) 40 MG tablet Take 1 tablet (40 mg total) by mouth 2 (two) times daily before a meal. 60 tablet 0  . pregabalin (LYRICA) 75 MG capsule Take 1 capsule (75 mg total) by mouth daily. 90 capsule 1  . sucralfate (CARAFATE) 1 g tablet Take 1 tablet (1 g total) by mouth 4 (four) times daily. (Patient taking differently: Take 1 g by mouth as needed (stomach). ) 60 tablet 0   No current facility-administered  medications on file prior to visit.    Allergies  Allergen Reactions  . Nsaids Other (See Comments)    Told to avoid all nsaids due to kidney disease   . Tape Other (See Comments)    Welts result,  if left for a long amount of time   Family History  Problem Relation Age of Onset  . Obesity Mother        Patient states that family members have no other medical illnesses other than what I have described  . Kidney cancer Maternal Grandmother   . Cancer Father 50       AML  . Heart disease Unknown        No family history   PE: There were no vitals taken for this visit. There is no height or weight on file to calculate BMI. Wt Readings from Last 3 Encounters:  06/19/17 255 lb (115.7 kg)  06/14/17 260 lb (117.9 kg)  06/12/17 261 lb (118.4 kg)   Constitutional: overweight, in NAD Eyes: PERRLA, EOMI, no exophthalmos ENT: moist mucous membranes, no thyromegaly, no cervical lymphadenopathy Cardiovascular: RRR, No MRG Respiratory: CTA B Gastrointestinal: abdomen soft, NT, ND, BS+ Musculoskeletal: no deformities, strength intact in all 4 Skin: moist, warm, no rashes Neurological: no tremor with outstretched hands, DTR normal in all 4  ASSESSMENT: 1. DM2, insulin-dependent, uncontrolled, with complications - CAD, 3x CABG 2017 - DR - PN - nephrotic sd. >> ESRD - HD MWF- [facility] nephrology at [facility], now Dr. Reginal Lutes   2. Hypothyroidism  3. HL  4. PN  PLAN:  1. Patient with uncontrolled type 2 diabetes, on basal-bolus insulin regimen.  At last visit, we tried to start Ozempic, but he is gastroparesis exacerbated and he had to stop.  At this visit, sugars are better, but they are still higher than target.  He is getting approximately 60 units of insulin from Lantus and less than 40 units from NovoLog.  We discussed that ideally this ratio is 50-50. We will increase his NovoLog at this visit. - We also discussed about the Freestyle libre CGM and I gave him a list of suppliers - I  suggested to:  Patient Instructions  Please stop at the lab.  Please continue: - Lantus/Basaglar 30 units 2x a day  Please increase: - Novolog 12-15 units before a smaller meal - Novolog 20-23 units before a larger meal  Please return in 3 months with your sugar log.   - today, HbA1c is 9.1% (improved) - continue checking sugars at different times of the day - check 3x a day, rotating checks - advised for yearly eye exams >> he is UTD - Return to clinic in 3 mo with sugar log    2. Hypothyroidism - at last visit, he was off LT4 - TSH checked at last visit was only slightly high. Lab Results  Component Value Date   TSH 5.25 (H) 04/23/2017  - He restarted levothyroxine 50 mcg daily after the above results returned - recheck TFTs today  3. HL - Reviewed latest lipid panel from 03/2017: LDL at goal, HDL slightly low Lab Results  Component Value Date   CHOL 134 03/21/2017   HDL 40 (L) 03/21/2017   LDLCALC 71 03/21/2017   TRIG 113 03/21/2017   CHOLHDL 3.4 03/21/2017  - Continues the statin without side effects.  4. PN - continues -2/2 DM + CIDP  Component     Latest Ref Rng & Units 07/26/2017  TSH     0.35 - 4.50 uIU/mL 5.03 (H)  T4,Free(Direct)     0.60 - 1.60 ng/dL 8.11   TSH is still elevated.  Will advise him to increase the dose to 75 mcg daily and will recheck his thyroid labs at next visit.  Philemon Kingdom, MD PhD [facility] Endocrinology

## 2017-07-27 DIAGNOSIS — N2581 Secondary hyperparathyroidism of renal origin: Secondary | ICD-10-CM | POA: Diagnosis not present

## 2017-07-27 DIAGNOSIS — E1143 Type 2 diabetes mellitus with diabetic autonomic (poly)neuropathy: Secondary | ICD-10-CM | POA: Diagnosis not present

## 2017-07-27 DIAGNOSIS — E1129 Type 2 diabetes mellitus with other diabetic kidney complication: Secondary | ICD-10-CM | POA: Diagnosis not present

## 2017-07-27 DIAGNOSIS — E1122 Type 2 diabetes mellitus with diabetic chronic kidney disease: Secondary | ICD-10-CM | POA: Diagnosis not present

## 2017-07-27 DIAGNOSIS — I2510 Atherosclerotic heart disease of native coronary artery without angina pectoris: Secondary | ICD-10-CM | POA: Diagnosis not present

## 2017-07-27 DIAGNOSIS — D631 Anemia in chronic kidney disease: Secondary | ICD-10-CM | POA: Diagnosis not present

## 2017-07-27 DIAGNOSIS — E875 Hyperkalemia: Secondary | ICD-10-CM | POA: Diagnosis not present

## 2017-07-27 DIAGNOSIS — I5023 Acute on chronic systolic (congestive) heart failure: Secondary | ICD-10-CM | POA: Diagnosis not present

## 2017-07-27 DIAGNOSIS — I132 Hypertensive heart and chronic kidney disease with heart failure and with stage 5 chronic kidney disease, or end stage renal disease: Secondary | ICD-10-CM | POA: Diagnosis not present

## 2017-07-27 DIAGNOSIS — G6181 Chronic inflammatory demyelinating polyneuritis: Secondary | ICD-10-CM | POA: Diagnosis not present

## 2017-07-27 DIAGNOSIS — N186 End stage renal disease: Secondary | ICD-10-CM | POA: Diagnosis not present

## 2017-07-27 DIAGNOSIS — D509 Iron deficiency anemia, unspecified: Secondary | ICD-10-CM | POA: Diagnosis not present

## 2017-07-30 DIAGNOSIS — N186 End stage renal disease: Secondary | ICD-10-CM | POA: Diagnosis not present

## 2017-07-30 DIAGNOSIS — D509 Iron deficiency anemia, unspecified: Secondary | ICD-10-CM | POA: Diagnosis not present

## 2017-07-30 DIAGNOSIS — E875 Hyperkalemia: Secondary | ICD-10-CM | POA: Diagnosis not present

## 2017-07-30 DIAGNOSIS — E1129 Type 2 diabetes mellitus with other diabetic kidney complication: Secondary | ICD-10-CM | POA: Diagnosis not present

## 2017-07-30 DIAGNOSIS — N2581 Secondary hyperparathyroidism of renal origin: Secondary | ICD-10-CM | POA: Diagnosis not present

## 2017-07-30 DIAGNOSIS — D631 Anemia in chronic kidney disease: Secondary | ICD-10-CM | POA: Diagnosis not present

## 2017-07-31 DIAGNOSIS — E1122 Type 2 diabetes mellitus with diabetic chronic kidney disease: Secondary | ICD-10-CM | POA: Diagnosis not present

## 2017-07-31 DIAGNOSIS — I2510 Atherosclerotic heart disease of native coronary artery without angina pectoris: Secondary | ICD-10-CM | POA: Diagnosis not present

## 2017-07-31 DIAGNOSIS — I5023 Acute on chronic systolic (congestive) heart failure: Secondary | ICD-10-CM | POA: Diagnosis not present

## 2017-07-31 DIAGNOSIS — I132 Hypertensive heart and chronic kidney disease with heart failure and with stage 5 chronic kidney disease, or end stage renal disease: Secondary | ICD-10-CM | POA: Diagnosis not present

## 2017-07-31 DIAGNOSIS — G6181 Chronic inflammatory demyelinating polyneuritis: Secondary | ICD-10-CM | POA: Diagnosis not present

## 2017-07-31 DIAGNOSIS — E1143 Type 2 diabetes mellitus with diabetic autonomic (poly)neuropathy: Secondary | ICD-10-CM | POA: Diagnosis not present

## 2017-08-01 ENCOUNTER — Telehealth: Payer: Self-pay | Admitting: Family Medicine

## 2017-08-01 DIAGNOSIS — N2581 Secondary hyperparathyroidism of renal origin: Secondary | ICD-10-CM | POA: Diagnosis not present

## 2017-08-01 DIAGNOSIS — D509 Iron deficiency anemia, unspecified: Secondary | ICD-10-CM | POA: Diagnosis not present

## 2017-08-01 DIAGNOSIS — E1129 Type 2 diabetes mellitus with other diabetic kidney complication: Secondary | ICD-10-CM | POA: Diagnosis not present

## 2017-08-01 DIAGNOSIS — N186 End stage renal disease: Secondary | ICD-10-CM | POA: Diagnosis not present

## 2017-08-01 DIAGNOSIS — D631 Anemia in chronic kidney disease: Secondary | ICD-10-CM | POA: Diagnosis not present

## 2017-08-01 DIAGNOSIS — E875 Hyperkalemia: Secondary | ICD-10-CM | POA: Diagnosis not present

## 2017-08-01 NOTE — Telephone Encounter (Signed)
Left a voicemail for [name] in regards to his appt he has with Dr. Clelia CroftShaw on 10/18/2017. Provider will not be in the office on that day and would need to reschedule.

## 2017-08-02 DIAGNOSIS — N049 Nephrotic syndrome with unspecified morphologic changes: Secondary | ICD-10-CM | POA: Diagnosis not present

## 2017-08-02 DIAGNOSIS — N186 End stage renal disease: Secondary | ICD-10-CM | POA: Diagnosis not present

## 2017-08-02 DIAGNOSIS — Z992 Dependence on renal dialysis: Secondary | ICD-10-CM | POA: Diagnosis not present

## 2017-08-03 DIAGNOSIS — N2581 Secondary hyperparathyroidism of renal origin: Secondary | ICD-10-CM | POA: Diagnosis not present

## 2017-08-03 DIAGNOSIS — D509 Iron deficiency anemia, unspecified: Secondary | ICD-10-CM | POA: Diagnosis not present

## 2017-08-03 DIAGNOSIS — D631 Anemia in chronic kidney disease: Secondary | ICD-10-CM | POA: Diagnosis not present

## 2017-08-03 DIAGNOSIS — E1129 Type 2 diabetes mellitus with other diabetic kidney complication: Secondary | ICD-10-CM | POA: Diagnosis not present

## 2017-08-03 DIAGNOSIS — N186 End stage renal disease: Secondary | ICD-10-CM | POA: Diagnosis not present

## 2017-08-06 ENCOUNTER — Other Ambulatory Visit: Payer: Self-pay

## 2017-08-06 ENCOUNTER — Encounter (HOSPITAL_COMMUNITY): Payer: Self-pay

## 2017-08-06 DIAGNOSIS — R112 Nausea with vomiting, unspecified: Secondary | ICD-10-CM

## 2017-08-06 DIAGNOSIS — E1129 Type 2 diabetes mellitus with other diabetic kidney complication: Secondary | ICD-10-CM | POA: Diagnosis not present

## 2017-08-06 DIAGNOSIS — D509 Iron deficiency anemia, unspecified: Secondary | ICD-10-CM | POA: Diagnosis not present

## 2017-08-06 DIAGNOSIS — N2581 Secondary hyperparathyroidism of renal origin: Secondary | ICD-10-CM | POA: Diagnosis not present

## 2017-08-06 DIAGNOSIS — D631 Anemia in chronic kidney disease: Secondary | ICD-10-CM | POA: Diagnosis not present

## 2017-08-06 DIAGNOSIS — N186 End stage renal disease: Secondary | ICD-10-CM | POA: Diagnosis not present

## 2017-08-06 DIAGNOSIS — R1310 Dysphagia, unspecified: Secondary | ICD-10-CM

## 2017-08-07 ENCOUNTER — Ambulatory Visit (HOSPITAL_COMMUNITY): Payer: Medicare Other | Admitting: Anesthesiology

## 2017-08-07 ENCOUNTER — Encounter (HOSPITAL_COMMUNITY)
Admission: RE | Disposition: A | Discharge status: Home / Self Care | Payer: Self-pay | Source: Ambulatory Visit | Attending: Gastroenterology

## 2017-08-07 ENCOUNTER — Encounter (HOSPITAL_COMMUNITY): Payer: Self-pay | Admitting: *Deleted

## 2017-08-07 ENCOUNTER — Other Ambulatory Visit: Payer: Self-pay

## 2017-08-07 ENCOUNTER — Ambulatory Visit (HOSPITAL_COMMUNITY)
Admission: RE | Admit: 2017-08-07 | Discharge: 2017-08-07 | Disposition: A | Discharge status: Home / Self Care | Payer: Medicare Other | Source: Ambulatory Visit | Attending: Gastroenterology | Admitting: Gastroenterology

## 2017-08-07 DIAGNOSIS — Z7951 Long term (current) use of inhaled steroids: Secondary | ICD-10-CM | POA: Diagnosis not present

## 2017-08-07 DIAGNOSIS — I10 Essential (primary) hypertension: Secondary | ICD-10-CM | POA: Insufficient documentation

## 2017-08-07 DIAGNOSIS — K2970 Gastritis, unspecified, without bleeding: Secondary | ICD-10-CM | POA: Diagnosis not present

## 2017-08-07 DIAGNOSIS — E1122 Type 2 diabetes mellitus with diabetic chronic kidney disease: Secondary | ICD-10-CM | POA: Insufficient documentation

## 2017-08-07 DIAGNOSIS — I120 Hypertensive chronic kidney disease with stage 5 chronic kidney disease or end stage renal disease: Secondary | ICD-10-CM | POA: Diagnosis not present

## 2017-08-07 DIAGNOSIS — Z79891 Long term (current) use of opiate analgesic: Secondary | ICD-10-CM | POA: Diagnosis not present

## 2017-08-07 DIAGNOSIS — E1165 Type 2 diabetes mellitus with hyperglycemia: Secondary | ICD-10-CM | POA: Insufficient documentation

## 2017-08-07 DIAGNOSIS — E1151 Type 2 diabetes mellitus with diabetic peripheral angiopathy without gangrene: Secondary | ICD-10-CM | POA: Insufficient documentation

## 2017-08-07 DIAGNOSIS — I25110 Atherosclerotic heart disease of native coronary artery with unstable angina pectoris: Secondary | ICD-10-CM | POA: Insufficient documentation

## 2017-08-07 DIAGNOSIS — K3184 Gastroparesis: Secondary | ICD-10-CM

## 2017-08-07 DIAGNOSIS — K2950 Unspecified chronic gastritis without bleeding: Secondary | ICD-10-CM | POA: Insufficient documentation

## 2017-08-07 DIAGNOSIS — F329 Major depressive disorder, single episode, unspecified: Secondary | ICD-10-CM | POA: Insufficient documentation

## 2017-08-07 DIAGNOSIS — R1310 Dysphagia, unspecified: Secondary | ICD-10-CM | POA: Diagnosis not present

## 2017-08-07 DIAGNOSIS — E1140 Type 2 diabetes mellitus with diabetic neuropathy, unspecified: Secondary | ICD-10-CM | POA: Insufficient documentation

## 2017-08-07 DIAGNOSIS — I2510 Atherosclerotic heart disease of native coronary artery without angina pectoris: Secondary | ICD-10-CM | POA: Diagnosis not present

## 2017-08-07 DIAGNOSIS — Z7982 Long term (current) use of aspirin: Secondary | ICD-10-CM | POA: Insufficient documentation

## 2017-08-07 DIAGNOSIS — E1143 Type 2 diabetes mellitus with diabetic autonomic (poly)neuropathy: Secondary | ICD-10-CM

## 2017-08-07 DIAGNOSIS — E039 Hypothyroidism, unspecified: Secondary | ICD-10-CM | POA: Insufficient documentation

## 2017-08-07 DIAGNOSIS — Z79899 Other long term (current) drug therapy: Secondary | ICD-10-CM | POA: Diagnosis not present

## 2017-08-07 DIAGNOSIS — K2961 Other gastritis with bleeding: Secondary | ICD-10-CM

## 2017-08-07 DIAGNOSIS — N186 End stage renal disease: Secondary | ICD-10-CM | POA: Diagnosis not present

## 2017-08-07 DIAGNOSIS — Z86718 Personal history of other venous thrombosis and embolism: Secondary | ICD-10-CM | POA: Insufficient documentation

## 2017-08-07 DIAGNOSIS — Z79890 Hormone replacement therapy (postmenopausal): Secondary | ICD-10-CM | POA: Insufficient documentation

## 2017-08-07 DIAGNOSIS — G6181 Chronic inflammatory demyelinating polyneuritis: Secondary | ICD-10-CM | POA: Diagnosis not present

## 2017-08-07 DIAGNOSIS — K269 Duodenal ulcer, unspecified as acute or chronic, without hemorrhage or perforation: Secondary | ICD-10-CM | POA: Insufficient documentation

## 2017-08-07 DIAGNOSIS — E1121 Type 2 diabetes mellitus with diabetic nephropathy: Secondary | ICD-10-CM | POA: Insufficient documentation

## 2017-08-07 DIAGNOSIS — Z794 Long term (current) use of insulin: Secondary | ICD-10-CM | POA: Diagnosis not present

## 2017-08-07 HISTORY — PX: ESOPHAGOGASTRODUODENOSCOPY (EGD) WITH PROPOFOL: SHX5813

## 2017-08-07 HISTORY — PX: BIOPSY: SHX5522

## 2017-08-07 LAB — GLUCOSE, CAPILLARY: Glucose-Capillary: 315 mg/dL — ABNORMAL HIGH (ref 70–99)

## 2017-08-07 SURGERY — ESOPHAGOGASTRODUODENOSCOPY (EGD) WITH PROPOFOL
Anesthesia: Monitor Anesthesia Care

## 2017-08-07 MED ORDER — SODIUM CHLORIDE 0.9 % IV SOLN
INTRAVENOUS | Status: DC
  Administered 2017-08-07: 11:00:00 via INTRAVENOUS

## 2017-08-07 MED ORDER — INSULIN ASPART 100 UNIT/ML ~~LOC~~ SOLN
10.0000 [IU] | Freq: Once | SUBCUTANEOUS | Status: AC
  Administered 2017-08-07: 10 [IU] via SUBCUTANEOUS

## 2017-08-07 MED ORDER — PROPOFOL 500 MG/50ML IV EMUL
INTRAVENOUS | Status: DC | PRN
  Administered 2017-08-07: 125 ug/kg/min via INTRAVENOUS

## 2017-08-07 MED ORDER — PROPOFOL 10 MG/ML IV BOLUS
INTRAVENOUS | Status: DC | PRN
  Administered 2017-08-07: 20 mg via INTRAVENOUS

## 2017-08-07 MED ORDER — INSULIN ASPART 100 UNIT/ML ~~LOC~~ SOLN
SUBCUTANEOUS | Status: AC
  Filled 2017-08-07: qty 1

## 2017-08-07 MED ORDER — PROPOFOL 10 MG/ML IV BOLUS
INTRAVENOUS | Status: AC
  Filled 2017-08-07: qty 60

## 2017-08-07 SURGICAL SUPPLY — 15 items

## 2017-08-07 NOTE — Anesthesia Postprocedure Evaluation (Signed)
Anesthesia Post Note  Patient: Russell [name]  Procedure(s) Performed: ESOPHAGOGASTRODUODENOSCOPY (EGD) WITH PROPOFOL (N/A )     Patient location during evaluation: PACU Anesthesia Type: MAC Level of consciousness: awake and alert Pain management: pain level controlled Vital Signs Assessment: post-procedure vital signs reviewed and stable Respiratory status: spontaneous breathing, nonlabored ventilation, respiratory function stable and patient connected to nasal cannula oxygen Cardiovascular status: stable and blood pressure returned to baseline Postop Assessment: no apparent nausea or vomiting Anesthetic complications: no    Last Vitals:  Vitals:   08/07/17 1220 08/07/17 1230  BP: (!) 131/58 119/79  Pulse: 72 74  Resp: 14 17  Temp:    SpO2: 98% 98%    Last Pain:  Vitals:   08/07/17 1230  TempSrc:   PainSc: 0-No pain                 Stacye Noori S

## 2017-08-07 NOTE — Progress Notes (Signed)
K 5.6, glucose 359 on istat.  Results called to Dr. Okey Dupreose.  Orders received for insulin.  Will recheck cbg following procedure per Dr. Okey Dupreose.  Roselie AwkwardShannon Tunisia Landgrebe, RN

## 2017-08-07 NOTE — Discharge Instructions (Signed)
YOU HAD AN ENDOSCOPIC PROCEDURE TODAY: Refer to the procedure report and other information in the discharge instructions given to you for any specific questions about what was found during the examination. If this information does not answer your questions, please call [place] office at [phone] to clarify.   YOU SHOULD EXPECT: Some feelings of bloating in the abdomen. Passage of more gas than usual. Walking can help get rid of the air that was put into your GI tract during the procedure and reduce the bloating. If you had a lower endoscopy (such as a colonoscopy or flexible sigmoidoscopy) you may notice spotting of blood in your stool or on the toilet paper. Some abdominal soreness may be present for a day or two, also.  DIET: Your first meal following the procedure should be a light meal and then it is ok to progress to your normal diet. A half-sandwich or bowl of soup is an example of a good first meal. Heavy or fried foods are harder to digest and may make you feel nauseous or bloated. Drink plenty of fluids but you should avoid alcoholic beverages for 24 hours. If you had a esophageal dilation, please see attached instructions for diet.    ACTIVITY: Your care partner should take you home directly after the procedure. You should plan to take it easy, moving slowly for the rest of the day. You can resume normal activity the day after the procedure however YOU SHOULD NOT DRIVE, use power tools, machinery or perform tasks that involve climbing or major physical exertion for 24 hours (because of the sedation medicines used during the test).   SYMPTOMS TO REPORT IMMEDIATELY: A gastroenterologist can be reached at any hour. Please call [phone]  for any of the following symptoms:   Following upper endoscopy (EGD, EUS, ERCP, esophageal dilation) Vomiting of blood or coffee ground material  New, significant abdominal pain  New, significant chest pain or pain under the shoulder blades  Painful or  persistently difficult swallowing  New shortness of breath  Black, tarry-looking or red, bloody stools  FOLLOW UP:  If any biopsies were taken you will be contacted by phone or by letter within the next 1-3 weeks. Call [phone]  if you have not heard about the biopsies in 3 weeks.  Please also call with any specific questions about appointments or follow up tests.  

## 2017-08-07 NOTE — Op Note (Signed)
[facility] Patient Name: Russell [name] Procedure Date: 08/07/2017 MRN: [id number] Attending MD: Estill Cotta. Loletha Carrow , MD Date of Birth: March 31, 1970 CSN: [id number] Age: 47 Admit Type: Outpatient Procedure:                Upper GI endoscopy Indications:              Dysphagia (poorly-controlled diabetes, recent Hgb                            A1C > 9, ESRD on HD. Recent barium esophogram with                            dysmotility) Providers:                Estill Cotta. Loletha Carrow, MD, Angus Seller, William Dalton, Technician Referring MD:              Medicines:                Monitored Anesthesia Care Complications:            No immediate complications. Estimated Blood Loss:     Estimated blood loss was minimal. Procedure:                Pre-Anesthesia Assessment:                           - Prior to the procedure, a History and Physical                            was performed, and patient medications and                            allergies were reviewed. The patient's tolerance of                            previous anesthesia was also reviewed. The risks                            and benefits of the procedure and the sedation                            options and risks were discussed with the patient.                            All questions were answered, and informed consent                            was obtained. Prior Anticoagulants: The patient has                            taken no previous anticoagulant or antiplatelet                            agents.  ASA Grade Assessment: III - A patient with                            severe systemic disease. After reviewing the risks                            and benefits, the patient was deemed in                            satisfactory condition to undergo the procedure.                           After obtaining informed consent, the endoscope was                            passed under direct  vision. Throughout the                            procedure, the patient's blood pressure, pulse, and                            oxygen saturations were monitored continuously. The                            GIF-H190 ([id number]) Olympus adult endoscope was                            introduced through the mouth, and advanced to the                            second part of duodenum. The upper GI endoscopy was                            accomplished without difficulty. The patient                            tolerated the procedure well. Scope In: Scope Out: Findings:      The larynx was normal.      The esophagus was normal. Specifically, there was no esophageal       dilatation, stricture, mass, or resistance passing the scope through the       EGJ. Active motility was seen.      Diffuse inflammation characterized by adherent blood, congestion (edema)       and erythema was found in the entire examined stomach. Biopsies were       taken with a cold forceps for histology. (Sidney protocol).      Food (residue) was found in the gastric body.      The cardia and gastric fundus were normal on retroflexion.      Multiple erosions were found in the duodenal bulb.      The exam of the duodenum was otherwise normal. Impression:               - Normal larynx.                           -  Normal esophagus.                           - Chronic gastritis. Biopsied.                           - Food (residue) in the stomach.                           - Duodenal erosions. Moderate Sedation:      MAC sedation used Recommendation:           - Patient has a contact number available for                            emergencies. The signs and symptoms of potential                            delayed complications were discussed with the                            patient. Return to normal activities tomorrow.                            Written discharge instructions were provided to the                             patient.                           - Resume previous diet.                           - Continue present medications. Metoclopramide dose                            cannot be increased due to ESRD.                           - Await pathology results.                           - Improved glycemic control is essential for                            improvement in sypmtoms of this UGI motility                            disorder. Procedure Code(s):        --- Professional ---                           [phone], Esophagogastroduodenoscopy, flexible,                            transoral; with biopsy, single or multiple Diagnosis Code(s):        --- Professional ---  K29.50, Unspecified chronic gastritis without                            bleeding                           K26.9, Duodenal ulcer, unspecified as acute or                            chronic, without hemorrhage or perforation                           R13.10, Dysphagia, unspecified CPT copyright 2017 American Medical Association. All rights reserved. The codes documented in this report are preliminary and upon coder review may  be revised to meet current compliance requirements.  L. Loletha Carrow, MD 08/07/2017 12:22:02 PM This report has been signed electronically. Number of Addenda: 0

## 2017-08-07 NOTE — H&P (Signed)
History:  This patient presents for endoscopic testing for dysphagia.  Russell [name] Referring physician: [name], MD  Past Medical History: Past Medical History:  Diagnosis Date  . Anxiety   . Asthma   . 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 Daily headache   . Depression   . DVT (deep venous thrombosis), H/o 01/2014-on Xarelto 03/24/2014   LLE  . ESRD (end stage renal disease) on dialysis [facility])    "[place], MWF" (06/29/2016)  . Gait abnormality 12/22/2016  . Gastroparesis 12/22/2016  . Hypertension   . Hypothyroidism   . Nephrotic syndrome 05/18/2014  . Neuropathy   . Type 2 diabetes mellitus with diabetic nephropathy [facility])      Past Surgical History: Past Surgical History:  Procedure Laterality Date  . ANKLE FRACTURE SURGERY Right 1988  . AV FISTULA PLACEMENT Left 01/01/2015   Procedure: CREATION OF LEFT RADIAL CEPHALIC ARTERIOVENOUS (AV) FISTULA ;  Surgeon: Pryor Ochoa, MD;  Location: [facility] OR;  Service: Vascular;  Laterality: Left;  . CAPD REMOVAL N/A 05/19/2016   Procedure: PD CATH REMOVAL;  Surgeon: Abigail Miyamoto, MD;  Location: [facility] OR;  Service: General;  Laterality: N/A;  . CARDIAC CATHETERIZATION N/A 09/22/2015   Procedure: Left Heart Cath and Coronary Angiography;  Surgeon: Iran Ouch, MD;  Location: [facility] INVASIVE CV LAB;  Service: Cardiovascular;  Laterality: N/A;  . CORONARY ARTERY BYPASS GRAFT N/A 09/28/2015   Procedure: CORONARY ARTERY BYPASS GRAFTING (CABG) x 3 UTILIZING LEFT MAMMARY ARTERY AND ENDOSCOPICALLY HARVESTED LEFT GREATER SAPHENOUS VEIN.;  Surgeon: Delight Ovens, MD;  Location: [facility] OR;  Service: Open Heart Surgery;  Laterality: N/A;  . ENDOVEIN HARVEST OF GREATER SAPHENOUS VEIN Left 09/28/2015   Procedure: ENDOVEIN HARVEST  OF GREATER SAPHENOUS VEIN;  Surgeon: Delight Ovens, MD;  Location: [facility] OR;  Service: Open Heart Surgery;  Laterality: Left;  . ESOPHAGOGASTRODUODENOSCOPY N/A 03/29/2014   Procedure: ESOPHAGOGASTRODUODENOSCOPY (EGD);  Surgeon: Dorena Cookey, MD;  Location: [facility] ENDOSCOPY;  Service: Endoscopy;  Laterality: N/A;  . EYE SURGERY    . FRACTURE SURGERY    . INSERTION OF DIALYSIS CATHETER Right 01/05/2015   Procedure: INSERTION OF RIGHT INTERNAL JUGULAR DIALYSIS CATHETER;  Surgeon: Fransisco Hertz, MD;  Location: [facility] OR;  Service: Vascular;  Laterality: Right;  . IR IMAGING GUIDED PORT INSERTION  06/19/2017  . LEFT HEART CATH AND CORS/GRAFTS ANGIOGRAPHY N/A 09/13/2016   Procedure: LEFT HEART CATH AND CORS/GRAFTS ANGIOGRAPHY;  Surgeon: Kathleene Hazel, MD;  Location: [facility] INVASIVE CV LAB;  Service: Cardiovascular;  Laterality: N/A;  . RETINAL LASER PROCEDURE Bilateral   . RIGHT/LEFT HEART CATH AND CORONARY/GRAFT ANGIOGRAPHY N/A 03/06/2017   Procedure: RIGHT/LEFT HEART CATH AND CORONARY/GRAFT ANGIOGRAPHY;  Surgeon: Marykay Lex, MD;  Location: [facility] INVASIVE CV LAB;  Service: Cardiovascular;  Laterality: N/A;  . TEE WITHOUT CARDIOVERSION N/A 09/28/2015   Procedure: TRANSESOPHAGEAL ECHOCARDIOGRAM (TEE);  Surgeon: Delight Ovens, MD;  Location: [facility] OR;  Service: Open Heart Surgery;  Laterality: N/A;  . TONSILLECTOMY AND ADENOIDECTOMY  1970s    Allergies: Allergies  Allergen Reactions  . Nsaids Other (See Comments)    Told to avoid all nsaids due to kidney disease   . Tape Other (See Comments)    Welts result, if left for a long amount of time  Outpatient Meds: Current Facility-Administered Medications  Medication Dose Route Frequency Provider Last Rate Last Dose  . 0.9 %  sodium chloride infusion   Intravenous Continuous Charlie Pitteranis, Henry L III, MD 20 mL/hr at 08/07/17 1100        ___________________________________________________________________ Objective   Exam:  BP (!) 183/99   Pulse 85    Temp 98.2 F (36.8 C) (Oral)   Resp 17   Ht 6\' 2"  (1.88 m)   Wt 257 lb 12.8 oz (116.9 kg)   SpO2 94%   BMI 33.10 kg/m    CV: RRR without murmur, S1/S2, no JVD, no peripheral edema  Resp: clear to auscultation bilaterally, normal RR and effort noted  GI: soft, no tenderness, with active bowel sounds. No guarding or palpable organomegaly noted.  Neuro: awake, alert and oriented x 3. Normal gross motor function and fluent speech   Assessment:  Dysphagia  Plan:  EGD   [name]

## 2017-08-07 NOTE — Transfer of Care (Signed)
Immediate Anesthesia Transfer of Care Note  Patient: Russell [name]  Procedure(s) Performed: ESOPHAGOGASTRODUODENOSCOPY (EGD) WITH PROPOFOL (N/A )  Patient Location: PACU  Anesthesia Type:MAC  Level of Consciousness: sedated  Airway & Oxygen Therapy: Patient Spontanous Breathing and Patient connected to nasal cannula oxygen  Post-op Assessment: Report given to RN and Post -op Vital signs reviewed and stable  Post vital signs: Reviewed and stable  Last Vitals:  Vitals Value Taken Time  BP    Temp    Pulse    Resp 16 08/07/2017 12:15 PM  SpO2    Vitals shown include unvalidated device data.  Last Pain:  Vitals:   08/07/17 1050  TempSrc: Oral  PainSc: 0-No pain         Complications: No apparent anesthesia complications

## 2017-08-07 NOTE — Anesthesia Preprocedure Evaluation (Addendum)
Anesthesia Evaluation  Patient identified by MRN, date of birth, ID band Patient awake    Reviewed: Allergy & Precautions, NPO status , Patient's Chart, lab work & pertinent test results  Airway Mallampati: II  TM Distance: >3 FB Neck ROM: Full    Dental no notable dental hx.    Pulmonary neg pulmonary ROS,    Pulmonary exam normal breath sounds clear to auscultation       Cardiovascular hypertension, + CAD, + CABG and + Peripheral Vascular Disease   Rhythm:Regular Rate:Normal + Systolic murmurs    Neuro/Psych negative neurological ROS  negative psych ROS   GI/Hepatic negative GI ROS, Neg liver ROS,   Endo/Other  diabetes, Poorly Controlled, Insulin DependentHypothyroidism   Renal/GU DialysisRenal disease  negative genitourinary   Musculoskeletal negative musculoskeletal ROS (+)   Abdominal   Peds negative pediatric ROS (+)  Hematology  (+) anemia ,   Anesthesia Other Findings   Reproductive/Obstetrics negative OB ROS                            Anesthesia Physical Anesthesia Plan  ASA: IV  Anesthesia Plan: MAC   Post-op Pain Management:    Induction: Intravenous  PONV Risk Score and Plan: 0  Airway Management Planned: Simple Face Mask  Additional Equipment:   Intra-op Plan:   Post-operative Plan:   Informed Consent: I have reviewed the patients History and Physical, chart, labs and discussed the procedure including the risks, benefits and alternatives for the proposed anesthesia with the patient or authorized representative who has indicated his/her understanding and acceptance.   Dental advisory given  Plan Discussed with: CRNA and Surgeon  Anesthesia Plan Comments:        Anesthesia Quick Evaluation

## 2017-08-07 NOTE — Interval H&P Note (Signed)
History and Physical Interval Note:  08/07/2017 11:59 AM  Russell [name]  has presented today for surgery, with the diagnosis of dypshagia, gastroparesis/db  The various methods of treatment have been discussed with the patient and family. After consideration of risks, benefits and other options for treatment, the patient has consented to  Procedure(s): ESOPHAGOGASTRODUODENOSCOPY (EGD) WITH PROPOFOL (N/A) as a surgical intervention .  The patient's history has been reviewed, patient examined, no change in status, stable for surgery.  I have reviewed the patient's chart and labs.  Questions were answered to the patient's satisfaction.     [name]

## 2017-08-08 ENCOUNTER — Encounter (HOSPITAL_COMMUNITY): Payer: Self-pay | Admitting: Gastroenterology

## 2017-08-08 DIAGNOSIS — D509 Iron deficiency anemia, unspecified: Secondary | ICD-10-CM | POA: Diagnosis not present

## 2017-08-08 DIAGNOSIS — E1129 Type 2 diabetes mellitus with other diabetic kidney complication: Secondary | ICD-10-CM | POA: Diagnosis not present

## 2017-08-08 DIAGNOSIS — N2581 Secondary hyperparathyroidism of renal origin: Secondary | ICD-10-CM | POA: Diagnosis not present

## 2017-08-08 DIAGNOSIS — D631 Anemia in chronic kidney disease: Secondary | ICD-10-CM | POA: Diagnosis not present

## 2017-08-08 DIAGNOSIS — N186 End stage renal disease: Secondary | ICD-10-CM | POA: Diagnosis not present

## 2017-08-08 LAB — POCT I-STAT 4, (NA,K, GLUC, HGB,HCT)
Glucose, Bld: 359 mg/dL — ABNORMAL HIGH (ref 70–99)
HEMATOCRIT: 37 % — AB (ref 39.0–52.0)
Hemoglobin: 12.6 g/dL — ABNORMAL LOW (ref 13.0–17.0)
Potassium: 5.6 mmol/L — ABNORMAL HIGH (ref 3.5–5.1)
Sodium: 132 mmol/L — ABNORMAL LOW (ref 135–145)

## 2017-08-09 DIAGNOSIS — G6181 Chronic inflammatory demyelinating polyneuritis: Secondary | ICD-10-CM | POA: Diagnosis not present

## 2017-08-09 DIAGNOSIS — I2510 Atherosclerotic heart disease of native coronary artery without angina pectoris: Secondary | ICD-10-CM | POA: Diagnosis not present

## 2017-08-09 DIAGNOSIS — I5023 Acute on chronic systolic (congestive) heart failure: Secondary | ICD-10-CM | POA: Diagnosis not present

## 2017-08-09 DIAGNOSIS — E1143 Type 2 diabetes mellitus with diabetic autonomic (poly)neuropathy: Secondary | ICD-10-CM | POA: Diagnosis not present

## 2017-08-09 DIAGNOSIS — I132 Hypertensive heart and chronic kidney disease with heart failure and with stage 5 chronic kidney disease, or end stage renal disease: Secondary | ICD-10-CM | POA: Diagnosis not present

## 2017-08-09 DIAGNOSIS — E1122 Type 2 diabetes mellitus with diabetic chronic kidney disease: Secondary | ICD-10-CM | POA: Diagnosis not present

## 2017-08-10 ENCOUNTER — Telehealth: Payer: Self-pay | Admitting: *Deleted

## 2017-08-10 DIAGNOSIS — N186 End stage renal disease: Secondary | ICD-10-CM | POA: Diagnosis not present

## 2017-08-10 DIAGNOSIS — E1129 Type 2 diabetes mellitus with other diabetic kidney complication: Secondary | ICD-10-CM | POA: Diagnosis not present

## 2017-08-10 DIAGNOSIS — D509 Iron deficiency anemia, unspecified: Secondary | ICD-10-CM | POA: Diagnosis not present

## 2017-08-10 DIAGNOSIS — D631 Anemia in chronic kidney disease: Secondary | ICD-10-CM | POA: Diagnosis not present

## 2017-08-10 DIAGNOSIS — N2581 Secondary hyperparathyroidism of renal origin: Secondary | ICD-10-CM | POA: Diagnosis not present

## 2017-08-10 NOTE — Telephone Encounter (Signed)
Faxed signed order back to [facility] re:POC at [phone]. Received fax confirmation.

## 2017-08-11 DIAGNOSIS — Z992 Dependence on renal dialysis: Secondary | ICD-10-CM | POA: Diagnosis not present

## 2017-08-11 DIAGNOSIS — G6181 Chronic inflammatory demyelinating polyneuritis: Secondary | ICD-10-CM | POA: Diagnosis not present

## 2017-08-11 DIAGNOSIS — I132 Hypertensive heart and chronic kidney disease with heart failure and with stage 5 chronic kidney disease, or end stage renal disease: Secondary | ICD-10-CM | POA: Diagnosis not present

## 2017-08-11 DIAGNOSIS — D631 Anemia in chronic kidney disease: Secondary | ICD-10-CM | POA: Diagnosis not present

## 2017-08-11 DIAGNOSIS — E1122 Type 2 diabetes mellitus with diabetic chronic kidney disease: Secondary | ICD-10-CM | POA: Diagnosis not present

## 2017-08-11 DIAGNOSIS — Z794 Long term (current) use of insulin: Secondary | ICD-10-CM | POA: Diagnosis not present

## 2017-08-11 DIAGNOSIS — E1143 Type 2 diabetes mellitus with diabetic autonomic (poly)neuropathy: Secondary | ICD-10-CM | POA: Diagnosis not present

## 2017-08-11 DIAGNOSIS — Z951 Presence of aortocoronary bypass graft: Secondary | ICD-10-CM | POA: Diagnosis not present

## 2017-08-11 DIAGNOSIS — E1140 Type 2 diabetes mellitus with diabetic neuropathy, unspecified: Secondary | ICD-10-CM | POA: Diagnosis not present

## 2017-08-11 DIAGNOSIS — I2510 Atherosclerotic heart disease of native coronary artery without angina pectoris: Secondary | ICD-10-CM | POA: Diagnosis not present

## 2017-08-11 DIAGNOSIS — F329 Major depressive disorder, single episode, unspecified: Secondary | ICD-10-CM | POA: Diagnosis not present

## 2017-08-11 DIAGNOSIS — R1314 Dysphagia, pharyngoesophageal phase: Secondary | ICD-10-CM | POA: Diagnosis not present

## 2017-08-11 DIAGNOSIS — F419 Anxiety disorder, unspecified: Secondary | ICD-10-CM | POA: Diagnosis not present

## 2017-08-11 DIAGNOSIS — Z7982 Long term (current) use of aspirin: Secondary | ICD-10-CM | POA: Diagnosis not present

## 2017-08-11 DIAGNOSIS — N186 End stage renal disease: Secondary | ICD-10-CM | POA: Diagnosis not present

## 2017-08-11 DIAGNOSIS — Z86718 Personal history of other venous thrombosis and embolism: Secondary | ICD-10-CM | POA: Diagnosis not present

## 2017-08-11 DIAGNOSIS — J45909 Unspecified asthma, uncomplicated: Secondary | ICD-10-CM | POA: Diagnosis not present

## 2017-08-11 DIAGNOSIS — Z7951 Long term (current) use of inhaled steroids: Secondary | ICD-10-CM | POA: Diagnosis not present

## 2017-08-11 DIAGNOSIS — I5023 Acute on chronic systolic (congestive) heart failure: Secondary | ICD-10-CM | POA: Diagnosis not present

## 2017-08-11 DIAGNOSIS — Z79891 Long term (current) use of opiate analgesic: Secondary | ICD-10-CM | POA: Diagnosis not present

## 2017-08-11 DIAGNOSIS — E039 Hypothyroidism, unspecified: Secondary | ICD-10-CM | POA: Diagnosis not present

## 2017-08-13 DIAGNOSIS — N2581 Secondary hyperparathyroidism of renal origin: Secondary | ICD-10-CM | POA: Diagnosis not present

## 2017-08-13 DIAGNOSIS — N186 End stage renal disease: Secondary | ICD-10-CM | POA: Diagnosis not present

## 2017-08-13 DIAGNOSIS — D509 Iron deficiency anemia, unspecified: Secondary | ICD-10-CM | POA: Diagnosis not present

## 2017-08-13 DIAGNOSIS — E1129 Type 2 diabetes mellitus with other diabetic kidney complication: Secondary | ICD-10-CM | POA: Diagnosis not present

## 2017-08-13 DIAGNOSIS — D631 Anemia in chronic kidney disease: Secondary | ICD-10-CM | POA: Diagnosis not present

## 2017-08-14 ENCOUNTER — Telehealth: Payer: Self-pay | Admitting: Family Medicine

## 2017-08-14 DIAGNOSIS — G6181 Chronic inflammatory demyelinating polyneuritis: Secondary | ICD-10-CM | POA: Diagnosis not present

## 2017-08-14 DIAGNOSIS — E1122 Type 2 diabetes mellitus with diabetic chronic kidney disease: Secondary | ICD-10-CM | POA: Diagnosis not present

## 2017-08-14 DIAGNOSIS — I132 Hypertensive heart and chronic kidney disease with heart failure and with stage 5 chronic kidney disease, or end stage renal disease: Secondary | ICD-10-CM | POA: Diagnosis not present

## 2017-08-14 DIAGNOSIS — I2510 Atherosclerotic heart disease of native coronary artery without angina pectoris: Secondary | ICD-10-CM | POA: Diagnosis not present

## 2017-08-14 DIAGNOSIS — E1143 Type 2 diabetes mellitus with diabetic autonomic (poly)neuropathy: Secondary | ICD-10-CM | POA: Diagnosis not present

## 2017-08-14 DIAGNOSIS — I5023 Acute on chronic systolic (congestive) heart failure: Secondary | ICD-10-CM | POA: Diagnosis not present

## 2017-08-14 NOTE — Telephone Encounter (Signed)
Copied from CRM [phone]. Topic: Quick Communication - See Telephone Encounter >> Aug 14, 2017 10:35 AM Luanna Coleawoud, Jessica L wrote: CRM for notification. See Telephone encounter for: 08/14/17. Kathlene [facility]Mike [facility] and stated that verbal orders for re certification. Please advise [phone]#[phone]

## 2017-08-15 DIAGNOSIS — N186 End stage renal disease: Secondary | ICD-10-CM | POA: Diagnosis not present

## 2017-08-15 DIAGNOSIS — E1129 Type 2 diabetes mellitus with other diabetic kidney complication: Secondary | ICD-10-CM | POA: Diagnosis not present

## 2017-08-15 DIAGNOSIS — N2581 Secondary hyperparathyroidism of renal origin: Secondary | ICD-10-CM | POA: Diagnosis not present

## 2017-08-15 DIAGNOSIS — D509 Iron deficiency anemia, unspecified: Secondary | ICD-10-CM | POA: Diagnosis not present

## 2017-08-15 DIAGNOSIS — D631 Anemia in chronic kidney disease: Secondary | ICD-10-CM | POA: Diagnosis not present

## 2017-08-16 DIAGNOSIS — H2511 Age-related nuclear cataract, right eye: Secondary | ICD-10-CM | POA: Diagnosis not present

## 2017-08-16 NOTE — Telephone Encounter (Signed)
Please advise. Dgaddy, CMA 

## 2017-08-17 DIAGNOSIS — D631 Anemia in chronic kidney disease: Secondary | ICD-10-CM | POA: Diagnosis not present

## 2017-08-17 DIAGNOSIS — E1129 Type 2 diabetes mellitus with other diabetic kidney complication: Secondary | ICD-10-CM | POA: Diagnosis not present

## 2017-08-17 DIAGNOSIS — H25012 Cortical age-related cataract, left eye: Secondary | ICD-10-CM | POA: Diagnosis not present

## 2017-08-17 DIAGNOSIS — N186 End stage renal disease: Secondary | ICD-10-CM | POA: Diagnosis not present

## 2017-08-17 DIAGNOSIS — N2581 Secondary hyperparathyroidism of renal origin: Secondary | ICD-10-CM | POA: Diagnosis not present

## 2017-08-17 DIAGNOSIS — D509 Iron deficiency anemia, unspecified: Secondary | ICD-10-CM | POA: Diagnosis not present

## 2017-08-20 DIAGNOSIS — E1129 Type 2 diabetes mellitus with other diabetic kidney complication: Secondary | ICD-10-CM | POA: Diagnosis not present

## 2017-08-20 DIAGNOSIS — N2581 Secondary hyperparathyroidism of renal origin: Secondary | ICD-10-CM | POA: Diagnosis not present

## 2017-08-20 DIAGNOSIS — D631 Anemia in chronic kidney disease: Secondary | ICD-10-CM | POA: Diagnosis not present

## 2017-08-20 DIAGNOSIS — N186 End stage renal disease: Secondary | ICD-10-CM | POA: Diagnosis not present

## 2017-08-20 DIAGNOSIS — D509 Iron deficiency anemia, unspecified: Secondary | ICD-10-CM | POA: Diagnosis not present

## 2017-08-22 DIAGNOSIS — D509 Iron deficiency anemia, unspecified: Secondary | ICD-10-CM | POA: Diagnosis not present

## 2017-08-22 DIAGNOSIS — N186 End stage renal disease: Secondary | ICD-10-CM | POA: Diagnosis not present

## 2017-08-22 DIAGNOSIS — N2581 Secondary hyperparathyroidism of renal origin: Secondary | ICD-10-CM | POA: Diagnosis not present

## 2017-08-22 DIAGNOSIS — D631 Anemia in chronic kidney disease: Secondary | ICD-10-CM | POA: Diagnosis not present

## 2017-08-22 DIAGNOSIS — E1129 Type 2 diabetes mellitus with other diabetic kidney complication: Secondary | ICD-10-CM | POA: Diagnosis not present

## 2017-08-24 DIAGNOSIS — D509 Iron deficiency anemia, unspecified: Secondary | ICD-10-CM | POA: Diagnosis not present

## 2017-08-24 DIAGNOSIS — H2512 Age-related nuclear cataract, left eye: Secondary | ICD-10-CM | POA: Diagnosis not present

## 2017-08-24 DIAGNOSIS — N186 End stage renal disease: Secondary | ICD-10-CM | POA: Diagnosis not present

## 2017-08-24 DIAGNOSIS — D631 Anemia in chronic kidney disease: Secondary | ICD-10-CM | POA: Diagnosis not present

## 2017-08-24 DIAGNOSIS — H25042 Posterior subcapsular polar age-related cataract, left eye: Secondary | ICD-10-CM | POA: Diagnosis not present

## 2017-08-24 DIAGNOSIS — E1129 Type 2 diabetes mellitus with other diabetic kidney complication: Secondary | ICD-10-CM | POA: Diagnosis not present

## 2017-08-24 DIAGNOSIS — H25012 Cortical age-related cataract, left eye: Secondary | ICD-10-CM | POA: Diagnosis not present

## 2017-08-24 DIAGNOSIS — N2581 Secondary hyperparathyroidism of renal origin: Secondary | ICD-10-CM | POA: Diagnosis not present

## 2017-08-27 DIAGNOSIS — N2581 Secondary hyperparathyroidism of renal origin: Secondary | ICD-10-CM | POA: Diagnosis not present

## 2017-08-27 DIAGNOSIS — D509 Iron deficiency anemia, unspecified: Secondary | ICD-10-CM | POA: Diagnosis not present

## 2017-08-27 DIAGNOSIS — N186 End stage renal disease: Secondary | ICD-10-CM | POA: Diagnosis not present

## 2017-08-27 DIAGNOSIS — D631 Anemia in chronic kidney disease: Secondary | ICD-10-CM | POA: Diagnosis not present

## 2017-08-27 DIAGNOSIS — E1129 Type 2 diabetes mellitus with other diabetic kidney complication: Secondary | ICD-10-CM | POA: Diagnosis not present

## 2017-08-28 DIAGNOSIS — E1122 Type 2 diabetes mellitus with diabetic chronic kidney disease: Secondary | ICD-10-CM | POA: Diagnosis not present

## 2017-08-28 DIAGNOSIS — I2510 Atherosclerotic heart disease of native coronary artery without angina pectoris: Secondary | ICD-10-CM | POA: Diagnosis not present

## 2017-08-28 DIAGNOSIS — I5023 Acute on chronic systolic (congestive) heart failure: Secondary | ICD-10-CM | POA: Diagnosis not present

## 2017-08-28 DIAGNOSIS — E1143 Type 2 diabetes mellitus with diabetic autonomic (poly)neuropathy: Secondary | ICD-10-CM | POA: Diagnosis not present

## 2017-08-28 DIAGNOSIS — I132 Hypertensive heart and chronic kidney disease with heart failure and with stage 5 chronic kidney disease, or end stage renal disease: Secondary | ICD-10-CM | POA: Diagnosis not present

## 2017-08-28 DIAGNOSIS — G6181 Chronic inflammatory demyelinating polyneuritis: Secondary | ICD-10-CM | POA: Diagnosis not present

## 2017-08-29 ENCOUNTER — Telehealth: Payer: Self-pay | Admitting: Family Medicine

## 2017-08-29 DIAGNOSIS — N2581 Secondary hyperparathyroidism of renal origin: Secondary | ICD-10-CM | POA: Diagnosis not present

## 2017-08-29 DIAGNOSIS — D509 Iron deficiency anemia, unspecified: Secondary | ICD-10-CM | POA: Diagnosis not present

## 2017-08-29 DIAGNOSIS — N186 End stage renal disease: Secondary | ICD-10-CM | POA: Diagnosis not present

## 2017-08-29 DIAGNOSIS — E1129 Type 2 diabetes mellitus with other diabetic kidney complication: Secondary | ICD-10-CM | POA: Diagnosis not present

## 2017-08-29 DIAGNOSIS — D631 Anemia in chronic kidney disease: Secondary | ICD-10-CM | POA: Diagnosis not present

## 2017-08-29 NOTE — Telephone Encounter (Signed)
Copied from CRM [phone]#[id number]. Topic: Inquiry >> Aug 29, 2017 10:28 AM [name], Russell B wrote: Reason for CRM: [facility] Dog knocked him over; left ankle is swollen pt did not seek medical attention; [facility] @ [phone]

## 2017-08-30 ENCOUNTER — Telehealth: Payer: Self-pay | Admitting: Cardiology

## 2017-08-30 NOTE — Telephone Encounter (Signed)
Spoke to patient . He states he has chest tightness or discomfort mainly after dialysis and headache.  RN asked if  Dialysis instructed him to do anything differently-- patient states they told him to call cardiology.  appointment moved to 09/06/17 with extender.  diaylsis is m-w-f.

## 2017-08-30 NOTE — Telephone Encounter (Signed)
New Message:   Pt c/o of Chest Pain: STAT if CP now or developed within 24 hours  1. Are you having CP right now? No   2. Are you experiencing any other symptoms (ex. SOB, nausea, vomiting, sweating)? No   3. How long have you been experiencing CP? Only after  Dialysis   4. Is your CP continuous or coming and going? Coming and Going  5. Have you taken Nitroglycerin? No      Pt c/o BP issue: STAT if pt c/o blurred vision, one-sided weakness or slurred speech  1. What are your last 5 BP readings? 196/116 Last night      2. Are you having any other symptoms (ex. Dizziness, headache, blurred vision, passed out)? headache  3. What is your BP issue? BP states his BP is too high  ?

## 2017-08-30 NOTE — Telephone Encounter (Signed)
Returned pt's call. [email or web address] 

## 2017-08-31 DIAGNOSIS — D509 Iron deficiency anemia, unspecified: Secondary | ICD-10-CM | POA: Diagnosis not present

## 2017-08-31 DIAGNOSIS — E1129 Type 2 diabetes mellitus with other diabetic kidney complication: Secondary | ICD-10-CM | POA: Diagnosis not present

## 2017-08-31 DIAGNOSIS — N2581 Secondary hyperparathyroidism of renal origin: Secondary | ICD-10-CM | POA: Diagnosis not present

## 2017-08-31 DIAGNOSIS — D631 Anemia in chronic kidney disease: Secondary | ICD-10-CM | POA: Diagnosis not present

## 2017-08-31 DIAGNOSIS — N186 End stage renal disease: Secondary | ICD-10-CM | POA: Diagnosis not present

## 2017-09-02 DIAGNOSIS — N186 End stage renal disease: Secondary | ICD-10-CM | POA: Diagnosis not present

## 2017-09-02 DIAGNOSIS — N049 Nephrotic syndrome with unspecified morphologic changes: Secondary | ICD-10-CM | POA: Diagnosis not present

## 2017-09-02 DIAGNOSIS — Z992 Dependence on renal dialysis: Secondary | ICD-10-CM | POA: Diagnosis not present

## 2017-09-03 DIAGNOSIS — E1129 Type 2 diabetes mellitus with other diabetic kidney complication: Secondary | ICD-10-CM | POA: Diagnosis not present

## 2017-09-03 DIAGNOSIS — D509 Iron deficiency anemia, unspecified: Secondary | ICD-10-CM | POA: Diagnosis not present

## 2017-09-03 DIAGNOSIS — D631 Anemia in chronic kidney disease: Secondary | ICD-10-CM | POA: Diagnosis not present

## 2017-09-03 DIAGNOSIS — N2581 Secondary hyperparathyroidism of renal origin: Secondary | ICD-10-CM | POA: Diagnosis not present

## 2017-09-03 DIAGNOSIS — N186 End stage renal disease: Secondary | ICD-10-CM | POA: Diagnosis not present

## 2017-09-05 ENCOUNTER — Inpatient Hospital Stay (HOSPITAL_COMMUNITY): Payer: Medicare Other

## 2017-09-05 ENCOUNTER — Emergency Department (HOSPITAL_COMMUNITY): Payer: Medicare Other

## 2017-09-05 ENCOUNTER — Inpatient Hospital Stay (HOSPITAL_COMMUNITY)
Admission: EM | Admit: 2017-09-05 | Discharge: 2017-09-08 | DRG: 640 | Disposition: A | Discharge status: Home / Self Care | Payer: Medicare Other | Attending: Family Medicine | Admitting: Family Medicine

## 2017-09-05 ENCOUNTER — Encounter (HOSPITAL_COMMUNITY): Payer: Self-pay | Admitting: Emergency Medicine

## 2017-09-05 DIAGNOSIS — R740 Nonspecific elevation of levels of transaminase and lactic acid dehydrogenase [LDH]: Secondary | ICD-10-CM | POA: Diagnosis not present

## 2017-09-05 DIAGNOSIS — Z794 Long term (current) use of insulin: Secondary | ICD-10-CM | POA: Diagnosis not present

## 2017-09-05 DIAGNOSIS — R0902 Hypoxemia: Secondary | ICD-10-CM | POA: Diagnosis not present

## 2017-09-05 DIAGNOSIS — K3184 Gastroparesis: Secondary | ICD-10-CM | POA: Diagnosis present

## 2017-09-05 DIAGNOSIS — K760 Fatty (change of) liver, not elsewhere classified: Secondary | ICD-10-CM | POA: Diagnosis not present

## 2017-09-05 DIAGNOSIS — Z7901 Long term (current) use of anticoagulants: Secondary | ICD-10-CM

## 2017-09-05 DIAGNOSIS — Z951 Presence of aortocoronary bypass graft: Secondary | ICD-10-CM | POA: Diagnosis not present

## 2017-09-05 DIAGNOSIS — I447 Left bundle-branch block, unspecified: Secondary | ICD-10-CM

## 2017-09-05 DIAGNOSIS — J45909 Unspecified asthma, uncomplicated: Secondary | ICD-10-CM | POA: Diagnosis present

## 2017-09-05 DIAGNOSIS — E875 Hyperkalemia: Secondary | ICD-10-CM | POA: Diagnosis not present

## 2017-09-05 DIAGNOSIS — N186 End stage renal disease: Secondary | ICD-10-CM

## 2017-09-05 DIAGNOSIS — E039 Hypothyroidism, unspecified: Secondary | ICD-10-CM | POA: Diagnosis not present

## 2017-09-05 DIAGNOSIS — E8770 Fluid overload, unspecified: Principal | ICD-10-CM | POA: Diagnosis present

## 2017-09-05 DIAGNOSIS — Z86718 Personal history of other venous thrombosis and embolism: Secondary | ICD-10-CM | POA: Diagnosis not present

## 2017-09-05 DIAGNOSIS — I878 Other specified disorders of veins: Secondary | ICD-10-CM | POA: Diagnosis present

## 2017-09-05 DIAGNOSIS — R739 Hyperglycemia, unspecified: Secondary | ICD-10-CM

## 2017-09-05 DIAGNOSIS — I2510 Atherosclerotic heart disease of native coronary artery without angina pectoris: Secondary | ICD-10-CM | POA: Diagnosis present

## 2017-09-05 DIAGNOSIS — E1122 Type 2 diabetes mellitus with diabetic chronic kidney disease: Secondary | ICD-10-CM | POA: Diagnosis present

## 2017-09-05 DIAGNOSIS — E1121 Type 2 diabetes mellitus with diabetic nephropathy: Secondary | ICD-10-CM | POA: Diagnosis present

## 2017-09-05 DIAGNOSIS — R7989 Other specified abnormal findings of blood chemistry: Secondary | ICD-10-CM | POA: Diagnosis present

## 2017-09-05 DIAGNOSIS — E1143 Type 2 diabetes mellitus with diabetic autonomic (poly)neuropathy: Secondary | ICD-10-CM | POA: Diagnosis present

## 2017-09-05 DIAGNOSIS — I10 Essential (primary) hypertension: Secondary | ICD-10-CM

## 2017-09-05 DIAGNOSIS — Z79890 Hormone replacement therapy (postmenopausal): Secondary | ICD-10-CM | POA: Diagnosis not present

## 2017-09-05 DIAGNOSIS — E1165 Type 2 diabetes mellitus with hyperglycemia: Secondary | ICD-10-CM | POA: Diagnosis not present

## 2017-09-05 DIAGNOSIS — J811 Chronic pulmonary edema: Secondary | ICD-10-CM | POA: Diagnosis not present

## 2017-09-05 DIAGNOSIS — Z7982 Long term (current) use of aspirin: Secondary | ICD-10-CM | POA: Diagnosis not present

## 2017-09-05 DIAGNOSIS — Z79899 Other long term (current) drug therapy: Secondary | ICD-10-CM | POA: Diagnosis not present

## 2017-09-05 DIAGNOSIS — I169 Hypertensive crisis, unspecified: Secondary | ICD-10-CM | POA: Diagnosis present

## 2017-09-05 DIAGNOSIS — D631 Anemia in chronic kidney disease: Secondary | ICD-10-CM | POA: Diagnosis not present

## 2017-09-05 DIAGNOSIS — E669 Obesity, unspecified: Secondary | ICD-10-CM | POA: Diagnosis present

## 2017-09-05 DIAGNOSIS — R1011 Right upper quadrant pain: Secondary | ICD-10-CM

## 2017-09-05 DIAGNOSIS — R1013 Epigastric pain: Secondary | ICD-10-CM | POA: Diagnosis not present

## 2017-09-05 DIAGNOSIS — I120 Hypertensive chronic kidney disease with stage 5 chronic kidney disease or end stage renal disease: Secondary | ICD-10-CM | POA: Diagnosis not present

## 2017-09-05 DIAGNOSIS — R945 Abnormal results of liver function studies: Secondary | ICD-10-CM

## 2017-09-05 DIAGNOSIS — R0602 Shortness of breath: Secondary | ICD-10-CM | POA: Diagnosis not present

## 2017-09-05 DIAGNOSIS — R112 Nausea with vomiting, unspecified: Secondary | ICD-10-CM | POA: Diagnosis not present

## 2017-09-05 DIAGNOSIS — I252 Old myocardial infarction: Secondary | ICD-10-CM

## 2017-09-05 DIAGNOSIS — R7401 Elevation of levels of liver transaminase levels: Secondary | ICD-10-CM

## 2017-09-05 DIAGNOSIS — R531 Weakness: Secondary | ICD-10-CM | POA: Diagnosis not present

## 2017-09-05 DIAGNOSIS — Z6833 Body mass index (BMI) 33.0-33.9, adult: Secondary | ICD-10-CM

## 2017-09-05 DIAGNOSIS — Z992 Dependence on renal dialysis: Secondary | ICD-10-CM

## 2017-09-05 DIAGNOSIS — G6181 Chronic inflammatory demyelinating polyneuritis: Secondary | ICD-10-CM | POA: Diagnosis present

## 2017-09-05 DIAGNOSIS — R069 Unspecified abnormalities of breathing: Secondary | ICD-10-CM | POA: Diagnosis not present

## 2017-09-05 DIAGNOSIS — E1110 Type 2 diabetes mellitus with ketoacidosis without coma: Secondary | ICD-10-CM | POA: Diagnosis not present

## 2017-09-05 DIAGNOSIS — N2581 Secondary hyperparathyroidism of renal origin: Secondary | ICD-10-CM | POA: Diagnosis present

## 2017-09-05 DIAGNOSIS — Z87441 Personal history of nephrotic syndrome: Secondary | ICD-10-CM

## 2017-09-05 DIAGNOSIS — E1022 Type 1 diabetes mellitus with diabetic chronic kidney disease: Secondary | ICD-10-CM | POA: Diagnosis not present

## 2017-09-05 HISTORY — DX: Left bundle-branch block, unspecified: I44.7

## 2017-09-05 LAB — CBC WITH DIFFERENTIAL/PLATELET
ABS IMMATURE GRANULOCYTES: 0.1 10*3/uL (ref 0.0–0.1)
BASOS ABS: 0 10*3/uL (ref 0.0–0.1)
Basophils Relative: 0 %
Eosinophils Absolute: 0 10*3/uL (ref 0.0–0.7)
Eosinophils Relative: 0 %
HCT: 40.3 % (ref 39.0–52.0)
Hemoglobin: 12.6 g/dL — ABNORMAL LOW (ref 13.0–17.0)
IMMATURE GRANULOCYTES: 1 %
Lymphocytes Relative: 3 %
Lymphs Abs: 0.3 10*3/uL — ABNORMAL LOW (ref 0.7–4.0)
MCH: 29.4 pg (ref 26.0–34.0)
MCHC: 31.3 g/dL (ref 30.0–36.0)
MCV: 94.2 fL (ref 78.0–100.0)
Monocytes Absolute: 0.4 10*3/uL (ref 0.1–1.0)
Monocytes Relative: 4 %
NEUTROS PCT: 92 %
Neutro Abs: 8.8 10*3/uL — ABNORMAL HIGH (ref 1.7–7.7)
PLATELETS: 229 10*3/uL (ref 150–400)
RBC: 4.28 MIL/uL (ref 4.22–5.81)
RDW: 16.2 % — ABNORMAL HIGH (ref 11.5–15.5)
WBC: 9.6 10*3/uL (ref 4.0–10.5)

## 2017-09-05 LAB — I-STAT CHEM 8, ED
BUN: 77 mg/dL — ABNORMAL HIGH (ref 6–20)
Calcium, Ion: 0.91 mmol/L — ABNORMAL LOW (ref 1.15–1.40)
Chloride: 99 mmol/L (ref 98–111)
Creatinine, Ser: 10.5 mg/dL — ABNORMAL HIGH (ref 0.61–1.24)
Glucose, Bld: 375 mg/dL — ABNORMAL HIGH (ref 70–99)
HEMATOCRIT: 39 % (ref 39.0–52.0)
HEMOGLOBIN: 13.3 g/dL (ref 13.0–17.0)
Potassium: 7 mmol/L (ref 3.5–5.1)
Sodium: 130 mmol/L — ABNORMAL LOW (ref 135–145)
TCO2: 19 mmol/L — AB (ref 22–32)

## 2017-09-05 LAB — COMPREHENSIVE METABOLIC PANEL
ALT: 310 U/L — AB (ref 0–44)
AST: 519 U/L — AB (ref 15–41)
Albumin: 3.7 g/dL (ref 3.5–5.0)
Alkaline Phosphatase: 83 U/L (ref 38–126)
Anion gap: 26 — ABNORMAL HIGH (ref 5–15)
BUN: 79 mg/dL — ABNORMAL HIGH (ref 6–20)
CHLORIDE: 94 mmol/L — AB (ref 98–111)
CO2: 18 mmol/L — AB (ref 22–32)
Calcium: 9.3 mg/dL (ref 8.9–10.3)
Creatinine, Ser: 10.09 mg/dL — ABNORMAL HIGH (ref 0.61–1.24)
GFR calc Af Amer: 6 mL/min — ABNORMAL LOW (ref >60)
GFR calc non Af Amer: 5 mL/min — ABNORMAL LOW (ref >60)
Glucose, Bld: 394 mg/dL — ABNORMAL HIGH (ref 70–99)
Potassium: 5.5 mmol/L — ABNORMAL HIGH (ref 3.5–5.1)
SODIUM: 138 mmol/L (ref 135–145)
Total Bilirubin: 1.9 mg/dL — ABNORMAL HIGH (ref 0.3–1.2)
Total Protein: 7.6 g/dL (ref 6.5–8.1)

## 2017-09-05 LAB — BASIC METABOLIC PANEL
ANION GAP: 21 — AB (ref 5–15)
Anion gap: 19 — ABNORMAL HIGH (ref 5–15)
BUN: 41 mg/dL — ABNORMAL HIGH (ref 6–20)
BUN: 46 mg/dL — ABNORMAL HIGH (ref 6–20)
CHLORIDE: 92 mmol/L — AB (ref 98–111)
CO2: 25 mmol/L (ref 22–32)
CO2: 25 mmol/L (ref 22–32)
CREATININE: 6.46 mg/dL — AB (ref 0.61–1.24)
CREATININE: 6.68 mg/dL — AB (ref 0.61–1.24)
Calcium: 9.2 mg/dL (ref 8.9–10.3)
Calcium: 9 mg/dL (ref 8.9–10.3)
Chloride: 92 mmol/L — ABNORMAL LOW (ref 98–111)
GFR calc non Af Amer: 9 mL/min — ABNORMAL LOW (ref >60)
GFR calc non Af Amer: 9 mL/min — ABNORMAL LOW (ref >60)
GFR, EST AFRICAN AMERICAN: 11 mL/min — AB (ref >60)
GFR, EST AFRICAN AMERICAN: 10 mL/min — AB (ref >60)
Glucose, Bld: 232 mg/dL — ABNORMAL HIGH (ref 70–99)
Glucose, Bld: 168 mg/dL — ABNORMAL HIGH (ref 70–99)
Potassium: 4.7 mmol/L (ref 3.5–5.1)
Potassium: 4.4 mmol/L (ref 3.5–5.1)
Sodium: 136 mmol/L (ref 135–145)
Sodium: 138 mmol/L (ref 135–145)

## 2017-09-05 LAB — RENAL FUNCTION PANEL
Albumin: 3.7 g/dL (ref 3.5–5.0)
Anion gap: 28 — ABNORMAL HIGH (ref 5–15)
BUN: 79 mg/dL — ABNORMAL HIGH (ref 6–20)
CO2: 17 mmol/L — ABNORMAL LOW (ref 22–32)
Calcium: 9.2 mg/dL (ref 8.9–10.3)
Chloride: 92 mmol/L — ABNORMAL LOW (ref 98–111)
Creatinine, Ser: 9.99 mg/dL — ABNORMAL HIGH (ref 0.61–1.24)
GFR calc Af Amer: 6 mL/min — ABNORMAL LOW
GFR calc non Af Amer: 5 mL/min — ABNORMAL LOW
Glucose, Bld: 397 mg/dL — ABNORMAL HIGH (ref 70–99)
Phosphorus: 9.1 mg/dL — ABNORMAL HIGH (ref 2.5–4.6)
Potassium: 5.4 mmol/L — ABNORMAL HIGH (ref 3.5–5.1)
Sodium: 137 mmol/L (ref 135–145)

## 2017-09-05 LAB — CBG MONITORING, ED: Glucose-Capillary: 203 mg/dL — ABNORMAL HIGH (ref 70–99)

## 2017-09-05 LAB — I-STAT TROPONIN, ED: TROPONIN I, POC: 0.05 ng/mL (ref 0.00–0.08)

## 2017-09-05 LAB — GLUCOSE, CAPILLARY
GLUCOSE-CAPILLARY: 237 mg/dL — AB (ref 70–99)
GLUCOSE-CAPILLARY: 193 mg/dL — AB (ref 70–99)
GLUCOSE-CAPILLARY: 174 mg/dL — AB (ref 70–99)
Glucose-Capillary: 158 mg/dL — ABNORMAL HIGH (ref 70–99)
Glucose-Capillary: 169 mg/dL — ABNORMAL HIGH (ref 70–99)
Glucose-Capillary: 213 mg/dL — ABNORMAL HIGH (ref 70–99)

## 2017-09-05 LAB — LIPASE, BLOOD: Lipase: 30 U/L (ref 11–51)

## 2017-09-05 MED ORDER — NEPRO/CARBSTEADY PO LIQD: 237.0000 mL | Freq: Three times a day (TID) | ORAL | Status: DC | PRN

## 2017-09-05 MED ORDER — HYDRALAZINE HCL 20 MG/ML IJ SOLN
INTRAMUSCULAR | Status: AC
  Filled 2017-09-05: qty 1

## 2017-09-05 MED ORDER — BASAGLAR KWIKPEN 100 UNIT/ML ~~LOC~~ SOPN: 30.0000 [IU] | PEN_INJECTOR | Freq: Two times a day (BID) | SUBCUTANEOUS | Status: DC

## 2017-09-05 MED ORDER — HYDROXYZINE HCL 25 MG PO TABS: 25.0000 mg | ORAL_TABLET | Freq: Three times a day (TID) | ORAL | Status: DC | PRN

## 2017-09-05 MED ORDER — SODIUM CHLORIDE 0.9 % IV SOLN
125.0000 mg | INTRAVENOUS | Status: DC
  Administered 2017-09-07: 125 mg via INTRAVENOUS
  Filled 2017-09-05 (×2): qty 10

## 2017-09-05 MED ORDER — HYDROCODONE-ACETAMINOPHEN 5-325 MG PO TABS: 1.0000 | ORAL_TABLET | Freq: Four times a day (QID) | ORAL | Status: DC | PRN

## 2017-09-05 MED ORDER — PROMETHAZINE HCL 25 MG/ML IJ SOLN
INTRAMUSCULAR | Status: AC
  Filled 2017-09-05: qty 1

## 2017-09-05 MED ORDER — ACETAMINOPHEN 325 MG PO TABS: 650.0000 mg | ORAL_TABLET | Freq: Four times a day (QID) | ORAL | Status: DC | PRN

## 2017-09-05 MED ORDER — SODIUM CHLORIDE 0.9 % IV SOLN
INTRAVENOUS | Status: AC
  Administered 2017-09-05: 1.8 [IU]/h via INTRAVENOUS
  Filled 2017-09-05: qty 1

## 2017-09-05 MED ORDER — CYCLOBENZAPRINE HCL 10 MG PO TABS: 10.0000 mg | ORAL_TABLET | Freq: Two times a day (BID) | ORAL | Status: DC | PRN

## 2017-09-05 MED ORDER — ZOLPIDEM TARTRATE 5 MG PO TABS: 5.0000 mg | ORAL_TABLET | Freq: Every evening | ORAL | Status: DC | PRN

## 2017-09-05 MED ORDER — SODIUM CHLORIDE 0.9 % IV SOLN
INTRAVENOUS | Status: DC
  Administered 2017-09-05: 19:00:00 via INTRAVENOUS

## 2017-09-05 MED ORDER — SODIUM CHLORIDE 0.9% FLUSH
3.0000 mL | Freq: Two times a day (BID) | INTRAVENOUS | Status: DC
  Administered 2017-09-05 – 2017-09-08 (×2): 3 mL via INTRAVENOUS

## 2017-09-05 MED ORDER — METOCLOPRAMIDE HCL 5 MG/ML IJ SOLN
10.0000 mg | Freq: Four times a day (QID) | INTRAMUSCULAR | Status: DC | PRN
  Administered 2017-09-05 – 2017-09-06 (×2): 10 mg via INTRAVENOUS
  Filled 2017-09-05 (×2): qty 2

## 2017-09-05 MED ORDER — ONDANSETRON HCL 4 MG/2ML IJ SOLN: 4.0000 mg | Freq: Four times a day (QID) | INTRAMUSCULAR | Status: DC | PRN

## 2017-09-05 MED ORDER — ONDANSETRON HCL 4 MG PO TABS: 4.0000 mg | ORAL_TABLET | Freq: Four times a day (QID) | ORAL | Status: DC | PRN

## 2017-09-05 MED ORDER — INSULIN ASPART 100 UNIT/ML IV SOLN: 10.0000 [IU] | Freq: Once | INTRAVENOUS | Status: DC

## 2017-09-05 MED ORDER — CALCIUM GLUCONATE 10 % IV SOLN
1.0000 g | Freq: Once | INTRAVENOUS | Status: AC
  Administered 2017-09-05: 1 g via INTRAVENOUS
  Filled 2017-09-05: qty 10

## 2017-09-05 MED ORDER — ALBUTEROL SULFATE (2.5 MG/3ML) 0.083% IN NEBU: 10.0000 mg | INHALATION_SOLUTION | Freq: Once | RESPIRATORY_TRACT | Status: DC

## 2017-09-05 MED ORDER — INSULIN ASPART 100 UNIT/ML ~~LOC~~ SOLN: 0.0000 [IU] | Freq: Every day | SUBCUTANEOUS | Status: DC

## 2017-09-05 MED ORDER — ACETAMINOPHEN 650 MG RE SUPP: 650.0000 mg | Freq: Four times a day (QID) | RECTAL | Status: DC | PRN

## 2017-09-05 MED ORDER — CAMPHOR-MENTHOL 0.5-0.5 % EX LOTN
1.0000 "application " | TOPICAL_LOTION | Freq: Three times a day (TID) | CUTANEOUS | Status: DC | PRN
  Filled 2017-09-05: qty 222

## 2017-09-05 MED ORDER — HYDRALAZINE HCL 20 MG/ML IJ SOLN
10.0000 mg | INTRAMUSCULAR | Status: DC | PRN
Start: 2017-09-05 — End: 2017-09-06
  Administered 2017-09-05 – 2017-09-06 (×5): 20 mg via INTRAVENOUS
  Filled 2017-09-05 (×3): qty 1

## 2017-09-05 MED ORDER — SODIUM CHLORIDE 0.9% FLUSH
10.0000 mL | INTRAVENOUS | Status: DC | PRN
  Administered 2017-09-06: 10 mL
  Administered 2017-09-07: 20 mL
  Administered 2017-09-08: 10 mL
  Filled 2017-09-05 (×3): qty 40

## 2017-09-05 MED ORDER — METOPROLOL SUCCINATE ER 50 MG PO TB24
50.0000 mg | ORAL_TABLET | Freq: Two times a day (BID) | ORAL | Status: DC
  Administered 2017-09-06 – 2017-09-08 (×5): 50 mg via ORAL
  Filled 2017-09-05 (×5): qty 1

## 2017-09-05 MED ORDER — IOPAMIDOL (ISOVUE-300) INJECTION 61%
100.0000 mL | Freq: Once | INTRAVENOUS | Status: AC | PRN
  Administered 2017-09-05: 100 mL via INTRAVENOUS

## 2017-09-05 MED ORDER — SODIUM CHLORIDE 0.9 % IV SOLN: 125.0000 mg | Freq: Once | INTRAVENOUS | Status: DC

## 2017-09-05 MED ORDER — ASPIRIN 81 MG PO CHEW
81.0000 mg | CHEWABLE_TABLET | Freq: Every day | ORAL | Status: DC
  Administered 2017-09-06 – 2017-09-08 (×3): 81 mg via ORAL
  Filled 2017-09-05 (×3): qty 1

## 2017-09-05 MED ORDER — LEVOTHYROXINE SODIUM 75 MCG PO TABS
75.0000 ug | ORAL_TABLET | Freq: Every day | ORAL | Status: DC
  Administered 2017-09-06 – 2017-09-08 (×3): 75 ug via ORAL
  Filled 2017-09-05 (×3): qty 1

## 2017-09-05 MED ORDER — DEXTROSE 50 % IV SOLN: 1.0000 | Freq: Once | INTRAVENOUS | Status: DC

## 2017-09-05 MED ORDER — ATORVASTATIN CALCIUM 80 MG PO TABS
80.0000 mg | ORAL_TABLET | Freq: Every day | ORAL | Status: DC
  Administered 2017-09-06 – 2017-09-07 (×2): 80 mg via ORAL
  Filled 2017-09-05 (×2): qty 1

## 2017-09-05 MED ORDER — DULOXETINE HCL 30 MG PO CPEP
30.0000 mg | ORAL_CAPSULE | Freq: Every day | ORAL | Status: DC
  Administered 2017-09-06 – 2017-09-08 (×3): 30 mg via ORAL
  Filled 2017-09-05 (×3): qty 1

## 2017-09-05 MED ORDER — INSULIN REGULAR BOLUS VIA INFUSION
0.0000 [IU] | Freq: Three times a day (TID) | INTRAVENOUS | Status: DC
  Filled 2017-09-05: qty 10

## 2017-09-05 MED ORDER — INSULIN GLARGINE 100 UNIT/ML ~~LOC~~ SOLN
30.0000 [IU] | Freq: Two times a day (BID) | SUBCUTANEOUS | Status: DC
  Filled 2017-09-05 (×2): qty 0.3

## 2017-09-05 MED ORDER — CALCIUM CARBONATE ANTACID 1250 MG/5ML PO SUSP
500.0000 mg | Freq: Four times a day (QID) | ORAL | Status: DC | PRN
  Filled 2017-09-05: qty 5

## 2017-09-05 MED ORDER — DEXTROSE 50 % IV SOLN
0.5000 | Freq: Once | INTRAVENOUS | Status: AC
  Administered 2017-09-05: 25 mL via INTRAVENOUS
  Filled 2017-09-05: qty 50

## 2017-09-05 MED ORDER — INSULIN ASPART 100 UNIT/ML ~~LOC~~ SOLN
10.0000 [IU] | Freq: Once | SUBCUTANEOUS | Status: AC
  Administered 2017-09-05: 10 [IU] via INTRAVENOUS
  Filled 2017-09-05: qty 1

## 2017-09-05 MED ORDER — CALCIUM ACETATE (PHOS BINDER) 667 MG PO CAPS
2001.0000 mg | ORAL_CAPSULE | Freq: Three times a day (TID) | ORAL | Status: DC
  Administered 2017-09-06 – 2017-09-08 (×3): 2001 mg via ORAL
  Filled 2017-09-05 (×6): qty 3

## 2017-09-05 MED ORDER — DEXTROSE-NACL 5-0.45 % IV SOLN
INTRAVENOUS | Status: DC
  Administered 2017-09-05: 20:00:00 via INTRAVENOUS

## 2017-09-05 MED ORDER — INSULIN ASPART 100 UNIT/ML ~~LOC~~ SOLN: 0.0000 [IU] | Freq: Three times a day (TID) | SUBCUTANEOUS | Status: DC

## 2017-09-05 MED ORDER — DEXTROSE 50 % IV SOLN: 25.0000 mL | INTRAVENOUS | Status: DC | PRN

## 2017-09-05 MED ORDER — ENOXAPARIN SODIUM 30 MG/0.3ML ~~LOC~~ SOLN
30.0000 mg | SUBCUTANEOUS | Status: DC
  Filled 2017-09-05 (×3): qty 0.3

## 2017-09-05 MED ORDER — PREGABALIN 75 MG PO CAPS
75.0000 mg | ORAL_CAPSULE | Freq: Every day | ORAL | Status: DC
  Administered 2017-09-06 – 2017-09-07 (×2): 75 mg via ORAL
  Filled 2017-09-05 (×2): qty 3

## 2017-09-05 MED ORDER — ALBUTEROL SULFATE (2.5 MG/3ML) 0.083% IN NEBU: 3.0000 mL | INHALATION_SOLUTION | Freq: Three times a day (TID) | RESPIRATORY_TRACT | Status: DC | PRN

## 2017-09-05 MED ORDER — PROMETHAZINE HCL 25 MG/ML IJ SOLN
12.5000 mg | Freq: Four times a day (QID) | INTRAMUSCULAR | Status: DC | PRN
  Administered 2017-09-05 – 2017-09-06 (×3): 12.5 mg via INTRAVENOUS
  Filled 2017-09-05 (×3): qty 1

## 2017-09-05 MED ORDER — CALCITRIOL 0.25 MCG PO CAPS
1.2500 ug | ORAL_CAPSULE | ORAL | Status: DC
  Administered 2017-09-07: 1.25 ug via ORAL
  Filled 2017-09-05: qty 1

## 2017-09-05 NOTE — Progress Notes (Signed)
Called for possible admission on this patient.  He was already taken for emergent HD.  If the patient continues to require admission after emergent HD, nephrology is asked to call back for admission at that time.  Georgana Curio, M.D

## 2017-09-05 NOTE — Consult Note (Addendum)
[facility] Renal Consultation Note  Indication for Consultation:  Management of ESRD/hemodialysis; anemia, hypertension/volume and secondary hyperparathyroidism  HPI: Russell [name] is a 47 y.o. male with ESRD sec. 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 Now  presented with N, V, hyperglycemia, elevated K.  Last dialysis was  Monday 09/03/17   in [place] uneventful with his noted 6.8 kg gain since Friday hd . Seen in ER co lower /upper extrem Numbness with K 7.0(peaked t waves on ekg and widening QRS ) , glucose 375 , CXR = with out edema or consolidation, bp elevated 211/113 on presentation and 2 kg over edw per er weight . We are consulted  For urgent HD with hyperkalemia       Past Medical History:  Diagnosis Date  . Anxiety   . Asthma   . CIDP (chronic inflammatory demyelinating polyneuropathy) ([place]) 01/10/2017  . 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 Daily headache   . Depression   . DVT (deep venous thrombosis), H/o 01/2014-on Xarelto 03/24/2014   LLE  . ESRD (end stage renal disease) on dialysis [facility])    "[place], MWF" (06/29/2016)  . Gait abnormality 12/22/2016  . Gastroparesis 12/22/2016  . Hypertension   . Hypothyroidism   . Nephrotic syndrome 05/18/2014  . Neuropathy   . Type 2 diabetes mellitus with diabetic nephropathy [facility])     Past Surgical History:  Procedure Laterality Date  . ANKLE FRACTURE SURGERY Right 1988  . AV FISTULA PLACEMENT Left 01/01/2015   Procedure: CREATION OF LEFT RADIAL CEPHALIC ARTERIOVENOUS (AV) FISTULA ;  Surgeon: Mal Misty, MD;  Location: [place];  Service: Vascular;  Laterality: Left;  . BIOPSY  08/07/2017   Procedure: BIOPSY;   Surgeon: Doran Stabler, MD;  Location: [facility];  Service: Gastroenterology;;  . CAPD REMOVAL N/A 05/19/2016   Procedure: PD CATH REMOVAL;  Surgeon: Coralie Keens, MD;  Location: [place];  Service: General;  Laterality: N/A;  . CARDIAC CATHETERIZATION N/A 09/22/2015   Procedure: Left Heart Cath and Coronary Angiography;  Surgeon: Wellington Hampshire, MD;  Location: [place] [facility];  Service: Cardiovascular;  Laterality: N/A;  . CORONARY ARTERY BYPASS GRAFT N/A 09/28/2015   Procedure: CORONARY ARTERY BYPASS GRAFTING (CABG) x 3 UTILIZING LEFT MAMMARY ARTERY AND ENDOSCOPICALLY HARVESTED LEFT GREATER SAPHENOUS VEIN.;  Surgeon: [name], MD;  Location: [place];  Service: Open Heart Surgery;  Laterality: N/A;  . ENDOVEIN HARVEST OF GREATER SAPHENOUS VEIN Left 09/28/2015   Procedure: ENDOVEIN HARVEST OF GREATER SAPHENOUS VEIN;  Surgeon: [name], MD;  Location: [place];  Service: Open Heart Surgery;  Laterality: Left;  . ESOPHAGOGASTRODUODENOSCOPY N/A 03/29/2014   Procedure: ESOPHAGOGASTRODUODENOSCOPY (EGD);  Surgeon: Teena Irani, MD;  Location: [facility];  Service: Endoscopy;  Laterality: N/A;  . ESOPHAGOGASTRODUODENOSCOPY (EGD) WITH PROPOFOL N/A 08/07/2017   Procedure: ESOPHAGOGASTRODUODENOSCOPY (EGD) WITH PROPOFOL;  Surgeon: Doran Stabler, MD;  Location: [facility];  Service: Gastroenterology;  Laterality: N/A;  . EYE SURGERY    . FRACTURE SURGERY    . INSERTION OF DIALYSIS CATHETER Right 01/05/2015   Procedure: INSERTION OF RIGHT INTERNAL JUGULAR DIALYSIS CATHETER;  Surgeon: Conrad Old River-Winfree, MD;  Location: [facility]  OR;  Service: Vascular;  Laterality: Right;  . IR IMAGING GUIDED PORT INSERTION  06/19/2017  . LEFT HEART CATH AND CORS/GRAFTS ANGIOGRAPHY N/A 09/13/2016   Procedure: LEFT HEART CATH AND CORS/GRAFTS ANGIOGRAPHY;  Surgeon: Burnell Blanks, MD;  Location: [place] [facility];  Service: Cardiovascular;  Laterality: N/A;  . RETINAL LASER PROCEDURE Bilateral   .  RIGHT/LEFT HEART CATH AND CORONARY/GRAFT ANGIOGRAPHY N/A 03/06/2017   Procedure: RIGHT/LEFT HEART CATH AND CORONARY/GRAFT ANGIOGRAPHY;  Surgeon: Leonie Man, MD;  Location: [place] [facility];  Service: Cardiovascular;  Laterality: N/A;  . TEE WITHOUT CARDIOVERSION N/A 09/28/2015   Procedure: TRANSESOPHAGEAL ECHOCARDIOGRAM (TEE);  Surgeon: [name], MD;  Location: [place];  Service: Open Heart Surgery;  Laterality: N/A;  . TONSILLECTOMY AND ADENOIDECTOMY  1970s      Family History  Problem Relation Age of Onset  . Obesity Mother        Patient states that family members have no other medical illnesses other than what I have described  . Kidney cancer Maternal Grandmother   . Cancer Father 29       AML  . Heart disease Unknown        No family history      reports that he has never smoked. He has never used smokeless tobacco. He reports that he drinks alcohol. He reports that he does not use drugs.   Allergies  Allergen Reactions  . Nsaids Other (See Comments)    Told to avoid all nsaids due to kidney disease   . Tape Other (See Comments)    Welts result, if left for a long amount of time    Prior to Admission medications   Medication Sig Start Date End Date Taking? Authorizing Provider  acetaminophen (TYLENOL) 325 MG tablet Take 1-2 tablets (325-650 mg total) by mouth every 4 (four) hours as needed for mild pain. 06/01/17  Yes Love, Ivan Anchors, PA-C  albuterol (PROVENTIL HFA;VENTOLIN HFA) 108 (90 Base) MCG/ACT inhaler Inhale 1 puff into the lungs every 6 (six) hours as needed for wheezing or shortness of breath. Patient taking differently: Inhale 2 puffs into the lungs every 8 (eight) hours as needed for wheezing or shortness of breath.  03/11/15  Yes [name], MD  aspirin EC 81 MG EC tablet Take 1 tablet (81 mg total) by mouth daily. 10/06/15  Yes Barrett, Erin R, PA-C  atorvastatin (LIPITOR) 80 MG tablet Take 1 tablet (80 mg total) by mouth daily at 6 PM. 01/05/17  Yes  Almyra Deforest, PA  calcitRIOL (ROCALTROL) 0.25 MCG capsule Take 5 capsules (1.25 mcg total) by mouth every Monday, Wednesday, and Friday with hemodialysis. 06/01/17  Yes Love, Ivan Anchors, PA-C  calcium acetate (PHOSLO) 667 MG capsule Take 2,001 mg by mouth 3 (three) times daily with meals.   Yes [provider]  cyclobenzaprine (FLEXERIL) 10 MG tablet Take 10 mg by mouth 2 (two) times daily as needed for muscle spasms.   Yes [provider]  DULoxetine (CYMBALTA) 30 MG capsule Take 1 capsule (30 mg total) by mouth daily. 06/12/17  Yes Shawnee Knapp, MD  HYDROcodone-acetaminophen (NORCO/VICODIN) 5-325 MG tablet Take 1 tablet by mouth every 12 (twelve) hours as needed for severe pain. Patient taking differently: Take 1 tablet by mouth every 6 (six) hours as needed for severe pain.  06/01/17  Yes Love, Ivan Anchors, PA-C  insulin aspart (NOVOLOG FLEXPEN) 100 UNIT/ML FlexPen Inject 8-20 Units into the skin 3 (three) times daily  with meals. Patient taking differently: Inject 5-10 Units into the skin 3 (three) times daily with meals. Per sliding scale 04/23/17  Yes Philemon Kingdom, MD  Insulin Glargine (BASAGLAR KWIKPEN) 100 UNIT/ML SOPN Inject 15 units 2x a day under skin Patient taking differently: Inject 30 Units into the skin 2 (two) times daily.  06/01/17  Yes Love, Ivan Anchors, PA-C  levothyroxine (SYNTHROID, LEVOTHROID) 75 MCG tablet Take 1 tablet (75 mcg total) by mouth daily before breakfast. 07/26/17  Yes Philemon Kingdom, MD  metoCLOPramide (REGLAN) 5 MG tablet Take 1 tablet (5 mg total) by mouth 2 times daily at 12 noon and 4 pm. Patient taking differently: Take 10 mg by mouth 2 (two) times daily.  05/25/17  Yes Colbert Ewing, MD  metoprolol succinate (TOPROL-XL) 25 MG 24 hr tablet Take 1.5 tablets (37.5 mg total) by mouth 2 (two) times daily. Patient taking differently: Take 50 mg by mouth 2 (two) times daily.  06/01/17  Yes Love, Ivan Anchors, PA-C  pregabalin (LYRICA) 75 MG capsule Take 1  capsule (75 mg total) by mouth daily. 06/12/17  Yes Shawnee Knapp, MD  Insulin Pen Needle (PEN NEEDLES) 32G X 4 MM MISC 1 Units by Does not apply route 6 (six) times daily. As needed and as directed by physician Patient not taking: Reported on 07/30/2017 06/12/17   Shawnee Knapp, MD    POE:[id number] **OR** acetaminophen, calcium carbonate (dosed in mg elemental calcium), camphor-menthol **AND** hydrOXYzine, dextrose, feeding supplement (NEPRO CARB STEADY), hydrALAZINE, HYDROcodone-acetaminophen, metoCLOPramide (REGLAN) injection, ondansetron **OR** ondansetron (ZOFRAN) IV, promethazine, sodium chloride flush, zolpidem  Results for orders placed or performed during the hospital encounter of 09/05/17 (from the past 48 hour(s))  CBC with Differential/Platelet     Status: Abnormal   Collection Time: 09/05/17  7:58 AM  Result Value Ref Range   WBC 9.6 4.0 - 10.5 K/uL   RBC 4.28 4.22 - 5.81 MIL/uL   Hemoglobin 12.6 (L) 13.0 - 17.0 g/dL   HCT 40.3 39.0 - 52.0 %   MCV 94.2 78.0 - 100.0 fL   MCH 29.4 26.0 - 34.0 pg   MCHC 31.3 30.0 - 36.0 g/dL   RDW 16.2 (H) 11.5 - 15.5 %   Platelets 229 150 - 400 K/uL   Neutrophils Relative % 92 %   Neutro Abs 8.8 (H) 1.7 - 7.7 K/uL   Lymphocytes Relative 3 %   Lymphs Abs 0.3 (L) 0.7 - 4.0 K/uL   Monocytes Relative 4 %   Monocytes Absolute 0.4 0.1 - 1.0 K/uL   Eosinophils Relative 0 %   Eosinophils Absolute 0.0 0.0 - 0.7 K/uL   Basophils Relative 0 %   Basophils Absolute 0.0 0.0 - 0.1 K/uL   Immature Granulocytes 1 %   Abs Immature Granulocytes 0.1 0.0 - 0.1 K/uL    Comment: Performed at [facility], [address], [place], [place] [address]  Comprehensive metabolic panel     Status: Abnormal   Collection Time: 09/05/17  7:58 AM  Result Value Ref Range   Sodium 138 135 - 145 mmol/L   Potassium 5.5 (H) 3.5 - 5.1 mmol/L   Chloride 94 (L) 98 - 111 mmol/L   CO2 18 (L) 22 - 32 mmol/L   Glucose, Bld 394 (H) 70 - 99 mg/dL   BUN 79 (H) 6 - 20 mg/dL    Creatinine, Ser 10.09 (H) 0.61 - 1.24 mg/dL   Calcium 9.3 8.9 - 10.3 mg/dL   Total Protein 7.6 6.5 -  8.1 g/dL   Albumin 3.7 3.5 - 5.0 g/dL   AST 519 (H) 15 - 41 U/L   ALT 310 (H) 0 - 44 U/L   Alkaline Phosphatase 83 38 - 126 U/L   Total Bilirubin 1.9 (H) 0.3 - 1.2 mg/dL   GFR calc non Af Amer 5 (L) >60 mL/min   GFR calc Af Amer 6 (L) >60 mL/min    Comment: (NOTE) The eGFR has been calculated using the CKD EPI equation. This calculation has not been validated in all clinical situations. eGFR's persistently <60 mL/min signify possible Chronic Kidney Disease.    Anion gap 26 (H) 5 - 15    Comment: Performed at [facility], [place] [address], [place], [place] [address]  Lipase, blood     Status: None   Collection Time: 09/05/17  7:58 AM  Result Value Ref Range   Lipase 30 11 - 51 U/L    Comment: Performed at [facility] [address] [place], [place] [address]  I-stat troponin, ED     Status: None   Collection Time: 09/05/17  8:16 AM  Result Value Ref Range   Troponin i, poc 0.05 0.00 - 0.08 ng/mL   Comment 3            Comment: Due to the release kinetics of cTnI, a negative result within the first hours of the onset of symptoms does not rule out myocardial infarction with certainty. If myocardial infarction is still suspected, repeat the test at appropriate intervals.   I-stat chem 8, ed     Status: Abnormal   Collection Time: 09/05/17  8:17 AM  Result Value Ref Range   Sodium 130 (L) 135 - 145 mmol/L   Potassium 7.0 (HH) 3.5 - 5.1 mmol/L   Chloride 99 98 - 111 mmol/L   BUN 77 (H) 6 - 20 mg/dL   Creatinine, Ser 10.50 (H) 0.61 - 1.24 mg/dL   Glucose, Bld 375 (H) 70 - 99 mg/dL   Calcium, Ion 0.91 (L) 1.15 - 1.40 mmol/L   TCO2 19 (L) 22 - 32 mmol/L   Hemoglobin 13.3 13.0 - 17.0 g/dL   HCT 39.0 39.0 - 52.0 %   Comment NOTIFIED PHYSICIAN   Renal function panel     Status: Abnormal   Collection Time: 09/05/17 12:01 PM  Result Value Ref Range   Sodium 137 135  - 145 mmol/L   Potassium 5.4 (H) 3.5 - 5.1 mmol/L   Chloride 92 (L) 98 - 111 mmol/L   CO2 17 (L) 22 - 32 mmol/L   Glucose, Bld 397 (H) 70 - 99 mg/dL   BUN 79 (H) 6 - 20 mg/dL   Creatinine, Ser 9.99 (H) 0.61 - 1.24 mg/dL   Calcium 9.2 8.9 - 10.3 mg/dL   Phosphorus 9.1 (H) 2.5 - 4.6 mg/dL   Albumin 3.7 3.5 - 5.0 g/dL   GFR calc non Af Amer 5 (L) >60 mL/min   GFR calc Af Amer 6 (L) >60 mL/min    Comment: (NOTE) The eGFR has been calculated using the CKD EPI equation. This calculation has not been validated in all clinical situations. eGFR's persistently <60 mL/min signify possible Chronic Kidney Disease.    Anion gap 28 (H) 5 - 15    Comment: Performed at [facility], [place] [address] [place], [place] [address]    ROS: as in hpi   Physical Exam: Vitals:   09/05/17 1330 09/05/17 1335  BP: Marland Kitchen)  193/104 (!) 189/93  Pulse: 93 95  Resp: (!) 21 14  Temp: (!) 97.5 F (36.4 C)   SpO2: 95%      General: IN Er  Alert, obese, WD, WN  WM , NAD, calm HEENT: [place]  ,MMM, nonicteric Neck: No jvd , supple Heart: RRR, no r,m,g Lungs: Left lower soft rale,non labored breathing  Abdomen: obese, soft , Nt, ND ,mild abd wall edema Extremities: bipedal edema Skin: scattered lower extrem. abrasions old healed  Neuro: Alert OX3, moves all extrem. Dialysis Access: pos bruit  LFA AVF   Dialysis Orders: Center:[place]   on MWF . EDW 118 kg HD Bath 2k, 2.25ca  Time 4hr Heparin 4000. Access LFA AVF     Mircera 113mg q 2wks (last given 08/30/17)  hgb 11.3 op hgb  8/28 Calcitriol 1.25 mcg po /HD Parsabiv 5 mg q hd Other Venofer 100 mg q hd (9/02>09/12/17)  Assessment/Plan 1. Hyperkalemia- HD today  Fu labs 2. Nausea/Vomiting /HO Gastroparesis- per admit team 3. ESRD -  HD  On MWF  4. Hypertension/volume  - bp up in ER ?sec vomiting meds,? missed bp meds, cxr with out edema or consolidation (noted  avg op gains 6 to 5 kg between txs  ) 5. Anemia of ESRD - HGB 13.3 (esa Given 8/28) no needs now /  fu trend continue FE load starting next tx 9/06 6. Metabolic bone disease -  PO calcitriol on HD / parsabiv not avail at [facility] , PHOS 10 or high last 3 labs as op (neede complice diet /binder/ phoslo 3 ac   Ca 9.2  7. DM type 1 -   meds per admit/ Admit glucose 394, Recent uncontrolled  Last hgb A1c 9.1 (07/25/17) 8. Nutrition - Renal/carb mod  Renal vit.  DErnest Haber PA-C [phone]/04/2017, 4:46 PM   Pt seen, examined and agree w A/P as above.  RKelly SplinterMD CNewell Rubbermaidpager [phone]  09/06/2017, 8:28 AM

## 2017-09-05 NOTE — ED Notes (Signed)
Pt sent back here from dialysis   He was sent there from here   There is no stepdown beds

## 2017-09-05 NOTE — Progress Notes (Signed)
Upon assessment of patient. There was not drsg//biopatch on patients PORT site. Pt. states that "I may have picked it off, thought it was time for it to come off." This nurse instructed patient on the importance of keep drsg on site to decrease risk of infection and to keep needle from being pulled out. Cleaned site and redressed using sterile technique. This nurse also spoke with patient's dialysis nurse and notified that drsg. was not present and to monitor for  patient removal of drsg. Attempted 1 PIV. Patient's dialysis nurse reported that they did not need another site a this time, they would just use PORT, and if IV needed, consult would be put back in. VU. Tomasita Morrow, RN VAST

## 2017-09-05 NOTE — ED Notes (Signed)
[facility] in Dialysis accepts report. PT can be taken to [facility]

## 2017-09-05 NOTE — ED Notes (Signed)
CRITICAL VALUE ALERT  Critical Value:  K 7.0  Date & Time Notied:  09/05/17 @ 0820   Provider Notified: Sharia Reeve, PA and Alyson Ingles, RN.

## 2017-09-05 NOTE — H&P (Signed)
History and Physical    Russell [name] ZOX:[id number] DOB: Jun 14, 1970 DOA: 09/05/2017  PCP: [name], MD Consultants:  Hyman Hopes - nephrology; Anne Hahn - neurology Patient coming from:  Home - lives with parents; NOK: Parents, [phone]  Chief Complaint: SOB and weakness  HPI: Russell [name] is a 47 y.o. male with medical history significant of DM; hypothyroidism; HTN; ESRD on MWF HD; DVT; CIDP; and CAD s/p CABG presenting with SOB and weakness.  He has hypertension and vomiting.  He has received hydralazine multiple times but still having markedly high BP.  Patient started feeling sick yesterday.  He developed n/v.  He has this occasionally.  He usually attends HD MWF and he went Monday; he was able to complete his session.  His BP normally runs "high" - consistent with current BP.  No sick contacts.  +diarrhea starting yesterday, maybe 3-4 total loose stools.  No fevers.  +SOB.  No cough.   ED Course:  Patient arrived and was taken up for emergent HD.  Following HD, he is continuing to have n/v and lethargy and continues to need admission.  Review of Systems: As per HPI; otherwise review of systems reviewed and negative.   Ambulatory Status:  Ambulates without assistance  Past Medical History:  Diagnosis Date  . Anxiety   . Asthma   . 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 Daily headache   . Depression   . DVT (deep venous thrombosis), H/o 01/2014-on Xarelto 03/24/2014   LLE  . ESRD (end stage renal disease) on dialysis [facility])    "[place], MWF" (06/29/2016)  . Gait abnormality 12/22/2016  . Gastroparesis 12/22/2016  . Hypertension   . Hypothyroidism   . Nephrotic syndrome 05/18/2014  . Neuropathy   . Type 2 diabetes mellitus with diabetic nephropathy [facility])      Past Surgical History:  Procedure Laterality Date  . ANKLE FRACTURE SURGERY Right 1988  . AV FISTULA PLACEMENT Left 01/01/2015   Procedure: CREATION OF LEFT RADIAL CEPHALIC ARTERIOVENOUS (AV) FISTULA ;  Surgeon: Pryor Ochoa, MD;  Location: [facility] OR;  Service: Vascular;  Laterality: Left;  . BIOPSY  08/07/2017   Procedure: BIOPSY;  Surgeon: Sherrilyn Rist, MD;  Location: [facility];  Service: Gastroenterology;;  . CAPD REMOVAL N/A 05/19/2016   Procedure: PD CATH REMOVAL;  Surgeon: Abigail Miyamoto, MD;  Location: [facility] OR;  Service: General;  Laterality: N/A;  . CARDIAC CATHETERIZATION N/A 09/22/2015   Procedure: Left Heart Cath and Coronary Angiography;  Surgeon: Iran Ouch, MD;  Location: [facility] INVASIVE CV LAB;  Service: Cardiovascular;  Laterality: N/A;  . CORONARY ARTERY BYPASS GRAFT N/A 09/28/2015   Procedure: CORONARY ARTERY BYPASS GRAFTING (CABG) x 3 UTILIZING LEFT MAMMARY ARTERY AND ENDOSCOPICALLY HARVESTED LEFT GREATER SAPHENOUS VEIN.;  Surgeon: Delight Ovens, MD;  Location: [facility] OR;  Service: Open Heart Surgery;  Laterality: N/A;  . ENDOVEIN HARVEST OF GREATER SAPHENOUS VEIN Left 09/28/2015   Procedure: ENDOVEIN HARVEST OF GREATER SAPHENOUS VEIN;  Surgeon: Delight Ovens, MD;  Location: [facility] OR;  Service: Open Heart Surgery;  Laterality: Left;  . ESOPHAGOGASTRODUODENOSCOPY N/A 03/29/2014   Procedure: ESOPHAGOGASTRODUODENOSCOPY (EGD);  Surgeon: Dorena Cookey, MD;  Location: [facility];  Service: Endoscopy;  Laterality: N/A;  . ESOPHAGOGASTRODUODENOSCOPY (EGD) WITH PROPOFOL N/A 08/07/2017  Procedure: ESOPHAGOGASTRODUODENOSCOPY (EGD) WITH PROPOFOL;  Surgeon: Sherrilyn Rist, MD;  Location: [facility] ENDOSCOPY;  Service: Gastroenterology;  Laterality: N/A;  . EYE SURGERY    . FRACTURE SURGERY    . INSERTION OF DIALYSIS CATHETER Right 01/05/2015   Procedure: INSERTION OF RIGHT INTERNAL JUGULAR DIALYSIS CATHETER;  Surgeon: Fransisco Hertz, MD;  Location: [facility] OR;  Service: Vascular;  Laterality:  Right;  . IR IMAGING GUIDED PORT INSERTION  06/19/2017  . LEFT HEART CATH AND CORS/GRAFTS ANGIOGRAPHY N/A 09/13/2016   Procedure: LEFT HEART CATH AND CORS/GRAFTS ANGIOGRAPHY;  Surgeon: Kathleene Hazel, MD;  Location: [facility] INVASIVE CV LAB;  Service: Cardiovascular;  Laterality: N/A;  . RETINAL LASER PROCEDURE Bilateral   . RIGHT/LEFT HEART CATH AND CORONARY/GRAFT ANGIOGRAPHY N/A 03/06/2017   Procedure: RIGHT/LEFT HEART CATH AND CORONARY/GRAFT ANGIOGRAPHY;  Surgeon: Marykay Lex, MD;  Location: [facility] INVASIVE CV LAB;  Service: Cardiovascular;  Laterality: N/A;  . TEE WITHOUT CARDIOVERSION N/A 09/28/2015   Procedure: TRANSESOPHAGEAL ECHOCARDIOGRAM (TEE);  Surgeon: Delight Ovens, MD;  Location: [facility] OR;  Service: Open Heart Surgery;  Laterality: N/A;  . TONSILLECTOMY AND ADENOIDECTOMY  1970s    Social History   Socioeconomic History  . Marital status: Single    Spouse name: Not on file  . Number of children: 0  . Years of education: College  . Highest education level: Not on file  Occupational History  . Occupation: Disabled    Comment: Data processing manager  Social Needs  . Financial resource strain: Not on file  . Food insecurity:    Worry: Not on file    Inability: Not on file  . Transportation needs:    Medical: Not on file    Non-medical: Not on file  Tobacco Use  . Smoking status: Never Smoker  . Smokeless tobacco: Never Used  Substance and Sexual Activity  . Alcohol use: Yes    Alcohol/week: 0.0 standard drinks    Comment: 06/29/2016 "might have a few drinks/year"  . Drug use: No  . Sexual activity: Yes  Lifestyle  . Physical activity:    Days per week: Not on file    Minutes per session: Not on file  . Stress: Not on file  Relationships  . Social connections:    Talks on phone: Not on file    Gets together: Not on file    Attends religious service: Not on file    Active member of club or organization: Not on file    Attends meetings of clubs or organizations:  Not on file    Relationship status: Not on file  . Intimate partner violence:    Fear of current or ex partner: Not on file    Emotionally abused: Not on file    Physically abused: Not on file    Forced sexual activity: Not on file  Other Topics Concern  . Not on file  Social History Narrative   Patient currently lives with his fiance   Works with facilities   Nonsmoker, nondrinker   Caffeine use: none   Right handed     Allergies  Allergen Reactions  . Nsaids Other (See Comments)    Told to avoid all nsaids due to kidney disease   . Tape Other (See Comments)    Welts result, if left for a long amount of time    Family History  Problem Relation Age of Onset  . Obesity Mother        Patient states that family members  have no other medical illnesses other than what I have described  . Kidney cancer Maternal Grandmother   . Cancer Father 50       AML  . Heart disease Unknown        No family history    Prior to Admission medications   Medication Sig Start Date End Date Taking? Authorizing Provider  acetaminophen (TYLENOL) 325 MG tablet Take 1-2 tablets (325-650 mg total) by mouth every 4 (four) hours as needed for mild pain. 06/01/17  Yes Love, Evlyn Kanner, PA-C  albuterol (PROVENTIL HFA;VENTOLIN HFA) 108 (90 Base) MCG/ACT inhaler Inhale 1 puff into the lungs every 6 (six) hours as needed for wheezing or shortness of breath. Patient taking differently: Inhale 2 puffs into the lungs every 8 (eight) hours as needed for wheezing or shortness of breath.  03/11/15  Yes [name], MD  aspirin EC 81 MG EC tablet Take 1 tablet (81 mg total) by mouth daily. 10/06/15  Yes Barrett, Erin R, PA-C  atorvastatin (LIPITOR) 80 MG tablet Take 1 tablet (80 mg total) by mouth daily at 6 PM. 01/05/17  Yes Azalee Course, PA  calcitRIOL (ROCALTROL) 0.25 MCG capsule Take 5 capsules (1.25 mcg total) by mouth every Monday, Wednesday, and Friday with hemodialysis. 06/01/17  Yes Love, Evlyn Kanner, PA-C  calcium  acetate (PHOSLO) 667 MG capsule Take 2,001 mg by mouth 3 (three) times daily with meals.   Yes [provider]  cyclobenzaprine (FLEXERIL) 10 MG tablet Take 10 mg by mouth 2 (two) times daily as needed for muscle spasms.   Yes [provider]  DULoxetine (CYMBALTA) 30 MG capsule Take 1 capsule (30 mg total) by mouth daily. 06/12/17  Yes [name], MD  HYDROcodone-acetaminophen (NORCO/VICODIN) 5-325 MG tablet Take 1 tablet by mouth every 12 (twelve) hours as needed for severe pain. Patient taking differently: Take 1 tablet by mouth every 6 (six) hours as needed for severe pain.  06/01/17  Yes Love, Evlyn Kanner, PA-C  insulin aspart (NOVOLOG FLEXPEN) 100 UNIT/ML FlexPen Inject 8-20 Units into the skin 3 (three) times daily with meals. Patient taking differently: Inject 5-10 Units into the skin 3 (three) times daily with meals. Per sliding scale 04/23/17  Yes Carlus Pavlov, MD  Insulin Glargine (BASAGLAR KWIKPEN) 100 UNIT/ML SOPN Inject 15 units 2x a day under skin Patient taking differently: Inject 30 Units into the skin 2 (two) times daily.  06/01/17  Yes Love, Evlyn Kanner, PA-C  levothyroxine (SYNTHROID, LEVOTHROID) 75 MCG tablet Take 1 tablet (75 mcg total) by mouth daily before breakfast. 07/26/17  Yes Carlus Pavlov, MD  metoCLOPramide (REGLAN) 5 MG tablet Take 1 tablet (5 mg total) by mouth 2 times daily at 12 noon and 4 pm. Patient taking differently: Take 10 mg by mouth 2 (two) times daily.  05/25/17  Yes Scherrie Gerlach, MD  metoprolol succinate (TOPROL-XL) 25 MG 24 hr tablet Take 1.5 tablets (37.5 mg total) by mouth 2 (two) times daily. Patient taking differently: Take 50 mg by mouth 2 (two) times daily.  06/01/17  Yes Love, Evlyn Kanner, PA-C  pregabalin (LYRICA) 75 MG capsule Take 1 capsule (75 mg total) by mouth daily. 06/12/17  Yes [name], MD  cyclobenzaprine (FLEXERIL) 5 MG tablet Take 1 tablet (5 mg total) by mouth 2 (two) times daily as needed for muscle spasms. Patient  not taking: Reported on 09/05/2017 06/01/17   Love, Evlyn Kanner, PA-C  fluticasone [facility]) 50 MCG/ACT nasal spray Place 1 spray into  both nostrils daily. Patient not taking: Reported on 07/30/2017 03/14/17   Briant Cedar, MD  Insulin Pen Needle (PEN NEEDLES) 32G X 4 MM MISC 1 Units by Does not apply route 6 (six) times daily. As needed and as directed by physician Patient not taking: Reported on 07/30/2017 06/12/17   [name], MD  pantoprazole (PROTONIX) 40 MG tablet Take 1 tablet (40 mg total) by mouth 2 (two) times daily before a meal. Patient not taking: Reported on 07/30/2017 05/25/17   Scherrie Gerlach, MD    Physical Exam: Vitals:   09/05/17 1315 09/05/17 1330 09/05/17 1335 09/05/17 1644  BP: (!) 168/89 (!) 193/104 (!) 189/93 (!) 167/107  Pulse: 96 93 95 99  Resp: 18 (!) 21 14 18   Temp:  (!) 97.5 F (36.4 C)  97.8 F (36.6 C)  TempSrc:  Oral  Oral  SpO2: 98% 95%  96%  Weight:         General: Appears ill - somewhat tachynpeic, nauseated Eyes:   EOMI, normal lids, iris ENT:  grossly normal hearing, lips & tongue, mmm Neck:  no LAD, masses or thyromegaly Cardiovascular:  RRR, no m/r/g. No LE edema.  Respiratory:   CTA bilaterally with no wheezes/rales/rhonchi.  Normal respiratory effort. Abdomen:  soft, NT, ND, NABS Back:   normal alignment, no CVAT Skin:  no rash or induration seen on limited exam Musculoskeletal:  grossly normal tone BUE/BLE, good ROM, no bony abnormality Lower extremity:  No LE edema.  Limited foot exam with no ulcerations.  2+ distal pulses. Psychiatric:  grossly normal mood and affect, speech fluent and appropriate, AOx3 Neurologic:  CN 2-12 grossly intact, moves all extremities in coordinated fashion, sensation intact    Radiological Exams on Admission: Dg Chest Portable 1 View  Result Date: 09/05/2017 CLINICAL DATA:  Shortness of breath EXAM: PORTABLE CHEST 1 VIEW COMPARISON:  May 17, 2017 FINDINGS: Port-A-Cath tip is at the cavoatrial junction.  No pneumothorax. There is atelectatic change in the mid lung and basilar regions. There is no edema or consolidation. There is cardiomegaly with mild pulmonary venous hypertension. Patient is status post coronary artery bypass grafting. No adenopathy evident. No evident bone lesions. IMPRESSION: Port-A-Cath as described without pneumothorax. Pulmonary vascular congestion without edema or consolidation. Mild atelectatic change in the mid lower lung zones. Status post coronary artery bypass grafting. Electronically Signed   By: Bretta Bang III M.D.   On: 09/05/2017 08:00    EKG: Independently reviewed.   6644 - NSR with rate 82; LBBB with no evidence of acute ischemia 0839 - NSR with rate 86; LBBB with no evidence of acute ischemia   Labs on Admission: I have personally reviewed the available labs and imaging studies at the time of the admission.  Pertinent labs:   K+ 5.4 (down from 7 pre-HD) CO2 17 Glucose 397 BUN 79/Creatinine 9.99/GFR 6 Phos 9.1 AST 519/ALT 310/Bili 1.9; normal on 5/19 WBC 9.6 Hgb 12.6 A1c 9.1 on 8/6 TSH 5.03/free T4 1.19 on 7/25  Assessment/Plan Principal Problem:   Hypertensive crisis Active Problems:   Hypothyroidism (acquired)   Diabetic ketoacidosis without coma associated with type 2 diabetes mellitus (HCC)   CIDP (chronic inflammatory demyelinating polyneuropathy) (HCC)   ESRD on dialysis (HCC)   Elevated LFTs   Hypertensive crisis -Patient presenting with SOB and n/v concerning for hypertensive crisis -This was exacerbated by volume overload (with hyperkalemia) and the patient was taken for urgent HD -However, despite HD, his BP has remained elevated and  he has required doses of hydralazine with only minimal improvement -The underlying cause may be related to his abdominal situation (see below); may be related to ESRD and his volume status; may be related to DKA; or may be associated with chronic suboptimal HTN control -Will admit to SDU -Will  add prn hydralazine -He appears to only be taking Toprol XL for BP control; there is good efficacy of ACE/ARB in patients with ESRD on HD and so this may need to be added  DKA -Patient with poor baseline control  -Abdominal issue may be the source -Glucose is high and despite HD he has a CO2 of 17 -Will admit to SDU with DKA protocol -Would recommend continuing insulin drip at least until morning regardless of rapidity of closure of gap and normalization of labs -K+ increased at time of presentation but improved with HD -IVF at 50 cc/hr, NS until glucose <250 and then change to D51/2NS; fluid status in this situation is challenging given ESRD  ESRD -Patient on chronic MWF HD -Nephrology prn order set utilized -He was taken for emergent HD upon arrival -Nephrology is following  Elevated LFTs with n/v -Concern is for abdominal issue that is the cause of his symptoms and led to his DKA -With persistent n/v and elevated LFTs, he needs a CT A/P -Will make NPO for now -With concurrent diarrhea (3 loose stools and immunocompromise, will order GI pathogen panel and C diff -Hepatitis panel ordered and pending -Recheck CMP in AM - if ongoing LFT elevation, he may require GI consult -He does have a h/o gastroparesis and chronic gastritis (EGD 8/6) and so this would explain symptoms but not elevated LFTs  CIDP -He receives IVIG infusions through his port regularly at home -He was previously on Cellcept but does not appear to be taking this at time, as per med rec form  Hypothyroidism -TSH midly elevated on 7/25 with normal free T4 -Will recheck both now -Continue Synthroid at current home dose for now    DVT prophylaxis: Lovenox Code Status:  Full - confirmed with patient Family Communication: None present Disposition Plan:  Home once clinically improved Consults called: Nephrology  Admission status: Admit - It is my clinical opinion that admission to INPATIENT is reasonable and  necessary because of the expectation that this patient will require hospital care that crosses at least 2 midnights to treat this condition based on the medical complexity of the problems presented.  Given the aforementioned information, the predictability of an adverse outcome is felt to be significant.    Jonah Blue MD [facility]  If note is complete, please contact covering daytime or nighttime physician. [email or web address] Password [email or web address]  09/05/2017, 5:04 PM

## 2017-09-05 NOTE — ED Notes (Signed)
REPORT CALLED TO  ERIN ON 5W

## 2017-09-05 NOTE — ED Provider Notes (Signed)
[facility] EMERGENCY DEPARTMENT Provider Note   CSN: [id number] Arrival date & time: 09/05/17  0708     History   Chief Complaint Chief Complaint  Patient presents with  . Shortness of Breath  . Weakness    HPI Russell [name] is a 47 y.o. male.  Patient with history of end-stage renal disease on hemodialysis, dialyzes through left forearm fistula, history of DVT on Xarelto, diabetic gastroparesis, diabetes, coronary artery disease--presents the emergency department from dialysis today with complaint of generalized weakness and vomiting.  Patient states that when he awoke this morning the symptoms were worsened.  He was too sick to have dialysis today so he was transferred to the emergency department.  Patient relates his vomiting to underlying gastroparesis.  Patient reports having "sweating" last night.  He has generalized weakness, no focal weakness.  His pain in his epigastric area without pain elsewhere in his chest or abdomen.  He reports ongoing diarrhea.  No worsening lower extremity swelling. The onset of this condition was acute. The course is constant. Aggravating factors: none. Alleviating factors: none.       Past Medical History:  Diagnosis Date  . Anxiety   . Asthma   . 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 Daily headache   . Depression   . DVT (deep venous thrombosis), H/o 01/2014-on Xarelto 03/24/2014   LLE  . ESRD (end stage renal disease) on dialysis [facility])    "[place], MWF" (06/29/2016)  . Gait abnormality 12/22/2016  . Gastroparesis 12/22/2016  . Hypertension   . Hypothyroidism   . Nephrotic syndrome 05/18/2014  . Neuropathy   . Type 2 diabetes mellitus with diabetic nephropathy [facility])     Patient Active Problem List     Diagnosis Date Noted  . Erosive gastritis with hemorrhage   . Dry eye syndrome of both eyes   . ESRD on dialysis (HCC)   . Labile blood glucose   . Benign essential HTN   . Neuropathic pain   . Debility 05/25/2017  . Diabetic peripheral neuropathy (HCC)   . Gastroparesis due to DM (HCC)   . Anemia of chronic disease   . Acute blood loss anemia   . Abnormal EKG   . Dysphagia   . Supplemental oxygen dependent   . Pulmonary edema 05/20/2017  . Chronic pain syndrome 04/23/2017  . Chronic peripheral neuropathic pain 04/23/2017  . CAP (community acquired pneumonia) 03/01/2017  . Anxiety 02/06/2017  . Asthma 02/06/2017  . 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 Hyperlipidemia 02/06/2017  . Macular degeneration 02/06/2017  . Restrictive airway disease 02/06/2017  . 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 Quadriparesis (HCC) 12/22/2016  . Gait abnormality 12/22/2016  . Hypertensive urgency 10/13/2016  . Acute pulmonary edema (HCC)   . Anemia in chronic kidney disease, on chronic dialysis (HCC)   . Weakness   . Hyperkalemia 09/26/2016  . History of DVT of lower extremity 08/17/2016  . Arteriosclerotic vascular disease 08/17/2016  . Malignant hypertension 06/29/2016  . Hypoxemic respiratory failure, chronic (HCC) 05/16/2016  . Anemia due to end stage renal disease (HCC) 05/16/2016  . Pre-transplant evaluation for kidney transplant 04/25/2016  . S/P CABG (coronary artery bypass graft) 09/28/2015  . Coronary artery disease involving native coronary artery of native heart without  angina pectoris   . Nephrotic syndrome 01/03/2015  . Essential hypertension, benign   . Diabetic ketoacidosis without coma associated with type 2 diabetes mellitus (HCC)   . Hx of gastroesophageal reflux (GERD) 09/30/2014  . Uncontrolled diabetes mellitus type 2 with peripheral artery disease (HCC) 09/21/2014  . Morbid obesity (HCC) 09/21/2014  . Proteinuria 07/09/2014  .  Chest pain 04/22/2014  . Superficial thrombophlebitis 03/24/2014  . Hypothyroidism (acquired) 03/24/2014    Past Surgical History:  Procedure Laterality Date  . ANKLE FRACTURE SURGERY Right 1988  . AV FISTULA PLACEMENT Left 01/01/2015   Procedure: CREATION OF LEFT RADIAL CEPHALIC ARTERIOVENOUS (AV) FISTULA ;  Surgeon: Pryor Ochoa, MD;  Location: [facility] OR;  Service: Vascular;  Laterality: Left;  . BIOPSY  08/07/2017   Procedure: BIOPSY;  Surgeon: Sherrilyn Rist, MD;  Location: [facility]COPY;  Service: Gastroenterology;;  . CAPD REMOVAL N/A 05/19/2016   Procedure: PD CATH REMOVAL;  Surgeon: Abigail Miyamoto, MD;  Location: [name] OR;  Service: General;  Laterality: N/A;  . CARDIAC CATHETERIZATION N/A 09/22/2015   Procedure: Left Heart Cath and Coronary Angiography;  Surgeon: Iran Ouch, MD;  Location: [facility] INVASIVE CV LAB;  Service: Cardiovascular;  Laterality: N/A;  . CORONARY ARTERY BYPASS GRAFT N/A 09/28/2015   Procedure: CORONARY ARTERY BYPASS GRAFTING (CABG) x 3 UTILIZING LEFT MAMMARY ARTERY AND ENDOSCOPICALLY HARVESTED LEFT GREATER SAPHENOUS VEIN.;  Surgeon: Delight Ovens, MD;  Location: [facility] OR;  Service: Open Heart Surgery;  Laterality: N/A;  . ENDOVEIN HARVEST OF GREATER SAPHENOUS VEIN Left 09/28/2015   Procedure: ENDOVEIN HARVEST OF GREATER SAPHENOUS VEIN;  Surgeon: Delight Ovens, MD;  Location: [name] OR;  Service: Open Heart Surgery;  Laterality: Left;  . ESOPHAGOGASTRODUODENOSCOPY N/A 03/29/2014   Procedure: ESOPHAGOGASTRODUODENOSCOPY (EGD);  Surgeon: Dorena Cookey, MD;  Location: [facility];  Service: Endoscopy;  Laterality: N/A;  . ESOPHAGOGASTRODUODENOSCOPY (EGD) WITH PROPOFOL N/A 08/07/2017   Procedure: ESOPHAGOGASTRODUODENOSCOPY (EGD) WITH PROPOFOL;  Surgeon: Sherrilyn Rist, MD;  Location: [facility] ENDOSCOPY;  Service: Gastroenterology;  Laterality: N/A;  . EYE SURGERY    . FRACTURE SURGERY    . INSERTION OF DIALYSIS CATHETER Right 01/05/2015   Procedure: INSERTION OF RIGHT  INTERNAL JUGULAR DIALYSIS CATHETER;  Surgeon: Fransisco Hertz, MD;  Location: [facility] OR;  Service: Vascular;  Laterality: Right;  . IR IMAGING GUIDED PORT INSERTION  06/19/2017  . LEFT HEART CATH AND CORS/GRAFTS ANGIOGRAPHY N/A 09/13/2016   Procedure: LEFT HEART CATH AND CORS/GRAFTS ANGIOGRAPHY;  Surgeon: Kathleene Hazel, MD;  Location: [facility] INVASIVE CV LAB;  Service: Cardiovascular;  Laterality: N/A;  . RETINAL LASER PROCEDURE Bilateral   . RIGHT/LEFT HEART CATH AND CORONARY/GRAFT ANGIOGRAPHY N/A 03/06/2017   Procedure: RIGHT/LEFT HEART CATH AND CORONARY/GRAFT ANGIOGRAPHY;  Surgeon: Marykay Lex, MD;  Location: [facility] INVASIVE CV LAB;  Service: Cardiovascular;  Laterality: N/A;  . TEE WITHOUT CARDIOVERSION N/A 09/28/2015   Procedure: TRANSESOPHAGEAL ECHOCARDIOGRAM (TEE);  Surgeon: Delight Ovens, MD;  Location: [facility] OR;  Service: Open Heart Surgery;  Laterality: N/A;  . TONSILLECTOMY AND ADENOIDECTOMY  1970s        Home Medications    Prior to Admission medications   Medication Sig Start Date End Date Taking? Authorizing Provider  acetaminophen (TYLENOL) 325 MG tablet Take 1-2 tablets (325-650 mg total) by mouth every 4 (four) hours as needed for mild pain. 06/01/17   Love, Evlyn Kanner, PA-C  albuterol (PROVENTIL HFA;VENTOLIN HFA) 108 (90 Base) MCG/ACT inhaler Inhale 1 puff into the lungs every  6 (six) hours as needed for wheezing or shortness of breath. Patient taking differently: Inhale 2 puffs into the lungs every 8 (eight) hours as needed for wheezing or shortness of breath.  03/11/15   [name], MD  aspirin EC 81 MG EC tablet Take 1 tablet (81 mg total) by mouth daily. 10/06/15   Barrett, Erin R, PA-C  atorvastatin (LIPITOR) 80 MG tablet Take 1 tablet (80 mg total) by mouth daily at 6 PM. 01/05/17   Azalee Course, PA  calcitRIOL (ROCALTROL) 0.25 MCG capsule Take 5 capsules (1.25 mcg total) by mouth every Monday, Wednesday, and Friday with hemodialysis. 06/01/17   Love, Evlyn Kanner, PA-C  calcium  acetate (PHOSLO) 667 MG capsule Take 2,001 mg by mouth 3 (three) times daily with meals.    [provider]  cyclobenzaprine (FLEXERIL) 5 MG tablet Take 1 tablet (5 mg total) by mouth 2 (two) times daily as needed for muscle spasms. 06/01/17   Love, Evlyn Kanner, PA-C  DULoxetine (CYMBALTA) 30 MG capsule Take 1 capsule (30 mg total) by mouth daily. 06/12/17   [name], MD  fluticasone (FLONASE) 50 MCG/ACT nasal spray Place 1 spray into both nostrils daily. Patient not taking: Reported on 07/30/2017 03/14/17   Briant Cedar, MD  HYDROcodone-acetaminophen (NORCO/VICODIN) 5-325 MG tablet Take 1 tablet by mouth every 12 (twelve) hours as needed for severe pain. Patient taking differently: Take 1 tablet by mouth every 6 (six) hours as needed for severe pain.  06/01/17   Love, Evlyn Kanner, PA-C  insulin aspart (NOVOLOG FLEXPEN) 100 UNIT/ML FlexPen Inject 8-20 Units into the skin 3 (three) times daily with meals. Patient taking differently: Inject 5-10 Units into the skin 3 (three) times daily with meals. Per sliding scale 04/23/17   Carlus Pavlov, MD  Insulin Glargine (BASAGLAR KWIKPEN) 100 UNIT/ML SOPN Inject 15 units 2x a day under skin Patient taking differently: Inject 30 Units into the skin 2 (two) times daily.  06/01/17   Love, Evlyn Kanner, PA-C  Insulin Pen Needle (PEN NEEDLES) 32G X 4 MM MISC 1 Units by Does not apply route 6 (six) times daily. As needed and as directed by physician Patient not taking: Reported on 07/30/2017 06/12/17   [name], MD  levothyroxine (SYNTHROID, LEVOTHROID) 50 MCG tablet Take 50 mcg by mouth daily before breakfast.    [provider]  levothyroxine (SYNTHROID, LEVOTHROID) 75 MCG tablet Take 1 tablet (75 mcg total) by mouth daily before breakfast. Patient not taking: Reported on 07/30/2017 07/26/17   Carlus Pavlov, MD  metoCLOPramide (REGLAN) 5 MG tablet Take 1 tablet (5 mg total) by mouth 2 times daily at 12 noon and 4 pm. Patient taking differently:  Take 10 mg by mouth 2 (two) times daily.  05/25/17   Scherrie Gerlach, MD  metoprolol succinate (TOPROL-XL) 25 MG 24 hr tablet Take 1.5 tablets (37.5 mg total) by mouth 2 (two) times daily. Patient taking differently: Take 50 mg by mouth 2 (two) times daily.  06/01/17   Love, Evlyn Kanner, PA-C  pantoprazole (PROTONIX) 40 MG tablet Take 1 tablet (40 mg total) by mouth 2 (two) times daily before a meal. Patient not taking: Reported on 07/30/2017 05/25/17   Scherrie Gerlach, MD  pregabalin (LYRICA) 75 MG capsule Take 1 capsule (75 mg total) by mouth daily. 06/12/17   [name], MD    Family History Family History  Problem Relation Age of Onset  . Obesity Mother  Patient states that family members have no other medical illnesses other than what I have described  . Kidney cancer Maternal Grandmother   . Cancer Father 50       AML  . Heart disease Unknown        No family history    Social History Social History   Tobacco Use  . Smoking status: Never Smoker  . Smokeless tobacco: Never Used  Substance Use Topics  . Alcohol use: Yes    Alcohol/week: 0.0 standard drinks    Comment: 06/29/2016 "might have a few drinks/year"  . Drug use: No     Allergies   Nsaids and Tape   Review of Systems Review of Systems  Constitutional: Positive for chills and fatigue. Negative for fever.  HENT: Negative for rhinorrhea and sore throat.   Eyes: Negative for redness.  Respiratory: Positive for shortness of breath. Negative for cough.   Cardiovascular: Negative for chest pain.  Gastrointestinal: Positive for nausea and vomiting. Negative for abdominal pain and diarrhea.  Genitourinary: Negative for dysuria.  Musculoskeletal: Negative for myalgias.  Skin: Negative for rash.  Neurological: Positive for dizziness and weakness. Negative for headaches.     Physical Exam Updated Vital Signs BP (!) 211/113   Pulse 84   Temp (!) 97.5 F (36.4 C)   Resp (!) 21   Wt 120 kg   BMI 33.97 kg/m    Physical Exam  Constitutional: He appears well-developed and well-nourished.  HENT:  Head: Normocephalic and atraumatic.  Eyes: Conjunctivae are normal. Right eye exhibits no discharge. Left eye exhibits no discharge.  Neck: Normal range of motion. Neck supple.  Cardiovascular: Normal rate, regular rhythm and normal heart sounds.  Pulmonary/Chest: Effort normal and breath sounds normal. He has no decreased breath sounds. He has no wheezes. He has no rales.  Abdominal: Soft. There is no tenderness.  Musculoskeletal:       Right lower leg: He exhibits edema (1+). He exhibits no tenderness.       Left lower leg: He exhibits edema (1+). He exhibits no tenderness.  Neurological: He is alert.  Skin: Skin is warm and dry.  Psychiatric: He has a normal mood and affect.  Nursing note and vitals reviewed.    ED Treatments / Results  Labs (all labs ordered are listed, but only abnormal results are displayed) Labs Reviewed  CBC WITH DIFFERENTIAL/PLATELET - Abnormal; Notable for the following components:      Result Value   Hemoglobin 12.6 (*)    RDW 16.2 (*)    Neutro Abs 8.8 (*)    Lymphs Abs 0.3 (*)    All other components within normal limits  I-STAT CHEM 8, ED - Abnormal; Notable for the following components:   Sodium 130 (*)    Potassium 7.0 (*)    BUN 77 (*)    Creatinine, Ser 10.50 (*)    Glucose, Bld 375 (*)    Calcium, Ion 0.91 (*)    TCO2 19 (*)    All other components within normal limits  COMPREHENSIVE METABOLIC PANEL  LIPASE, BLOOD  I-STAT TROPONIN, ED    EKG EKG Interpretation  Date/Time:  Wednesday September 05 2017 07:13:08 EDT Ventricular Rate:  82 PR Interval:    QRS Duration: 166 QT Interval:  487 QTC Calculation: 569 R Axis:   17 Text Interpretation:  Sinus rhythm Prolonged PR interval Left bundle branch block Confirmed by Loren Racer ([id number]) on 09/05/2017 7:25:29 AM   Radiology Dg Chest Portable  1 View  Result Date: 09/05/2017 CLINICAL DATA:   Shortness of breath EXAM: PORTABLE CHEST 1 VIEW COMPARISON:  May 17, 2017 FINDINGS: Port-A-Cath tip is at the cavoatrial junction. No pneumothorax. There is atelectatic change in the mid lung and basilar regions. There is no edema or consolidation. There is cardiomegaly with mild pulmonary venous hypertension. Patient is status post coronary artery bypass grafting. No adenopathy evident. No evident bone lesions. IMPRESSION: Port-A-Cath as described without pneumothorax. Pulmonary vascular congestion without edema or consolidation. Mild atelectatic change in the mid lower lung zones. Status post coronary artery bypass grafting. Electronically Signed   By: Bretta Bang III M.D.   On: 09/05/2017 08:00    Procedures Procedures (including critical care time)  Medications Ordered in ED Medications  albuterol (PROVENTIL) (2.5 MG/3ML) 0.083% nebulizer solution 10 mg (has no administration in time range)  calcium gluconate inj 10% (1 g) URGENT USE ONLY! (1 g Intravenous Given 09/05/17 0828)  insulin aspart (novoLOG) injection 10 Units (10 Units Intravenous Given 09/05/17 0847)  dextrose 50 % solution 25 mL (25 mLs Intravenous Given 09/05/17 0846)     Initial Impression / Assessment and Plan / ED Course  I have reviewed the triage vital signs and the nursing notes.  Pertinent labs & imaging results that were available during my care of the patient were reviewed by me and considered in my medical decision making (see chart for details).     Patient seen and examined. EKG reviewed. Work-up initiated.   Vital signs reviewed and are as follows: BP (!) 211/113   Pulse 84   Temp (!) 97.5 F (36.4 C)   Resp (!) 21   Wt 120 kg   BMI 33.97 kg/m   7:23 PM EKG reviewed with Dr. Ranae Palms. There is appearance of peaked t-waves and QRS widening when compared to previous. Will empirically give calcium for this reason. Pt access and lab draw may be delayed due to accessing port.   Tech at bedside straight  sticking patient until port-a-cath can be accessed.   8:29 AM K+ 7.0 on istat. Will need dialysis. Pt is at [facility], Dr. Hyman Hopes primary.   I spoke with Dr. Arlean Hopping who will arrange for hemodialysis this morning. Albuterol, insulin ordered for treatment of hyperkalemia.   Continues to be hypertensive. Will hold medications until he is dialyzed.   CRITICAL CARE Performed by: Renne Crigler PA-C Total critical care time: 40 minutes Critical care time was exclusive of separately billable procedures and treating other patients. Critical care was necessary to treat or prevent imminent or life-threatening deterioration. Critical care was time spent personally by me on the following activities: development of treatment plan with patient and/or surrogate as well as nursing, discussions with consultants, evaluation of patient's response to treatment, examination of patient, obtaining history from patient or surrogate, ordering and performing treatments and interventions, ordering and review of laboratory studies, ordering and review of radiographic studies, pulse oximetry and re-evaluation of patient's condition.   10:01 AM Renal had seen patient, requested we call for medicine admission.   I spoke with Dr. Ophelia Charter, patient now off the floor, who requests that I ask renal to call for admission, if they still deem necessary, after completion of dialysis.   10:34 AM Unable to get in contact with nephrology x 2 pages. I placed text page thru amion to Dr. Arlean Hopping.   Final Clinical Impressions(s) / ED Diagnoses   Final diagnoses:  Hyperkalemia  ESRD (end stage renal disease) (HCC)  Hypertension,  unspecified type  Hyperglycemia   Will need admission for hyperkalemia with EKG changes. N/V and weakness likely related to need for dialysis but hyperglycemia/gastroparesis may be contributing.    ED Discharge Orders    None       Renne Crigler, Cordelia Poche 09/05/17 1035    Loren Racer, MD 09/06/17  1039

## 2017-09-05 NOTE — ED Notes (Signed)
Now I have an order for the glucose stabilizer ?????

## 2017-09-05 NOTE — ED Notes (Signed)
Iv team coming to check port  Keeps reading occluded  He needs a c-t with contrast also

## 2017-09-05 NOTE — ED Notes (Signed)
Pt CBG 203 

## 2017-09-05 NOTE — ED Notes (Signed)
Pt returned from xray vomiting  Phenergan given iv  And c-t came for him

## 2017-09-05 NOTE — ED Notes (Signed)
Pt is still nauseated and cannot hold po meds down

## 2017-09-05 NOTE — ED Triage Notes (Signed)
PT reports epigastric area pain that started yesterday. PT reports weakness, lethargy, SOB, dizziness, and nausea that started last night. PT denies chest pain at this time. PT reported to dialysis this AM and vomited. Dialysis center called EMS prior to PT starting dialysis.

## 2017-09-05 NOTE — ED Notes (Signed)
Gieple, PA verbal order to use port, xray completed in ED and reviewed by provider, pt reports having port accessed last week, port access procedure completed and tolerated well

## 2017-09-05 NOTE — ED Notes (Signed)
Pt went to xray then c-t before is could  Give his sq insulin that is due

## 2017-09-06 ENCOUNTER — Inpatient Hospital Stay (HOSPITAL_COMMUNITY): Payer: Medicare Other

## 2017-09-06 ENCOUNTER — Encounter (HOSPITAL_COMMUNITY): Payer: Self-pay | Admitting: Family Medicine

## 2017-09-06 ENCOUNTER — Ambulatory Visit: Payer: Medicare Other | Admitting: Cardiology

## 2017-09-06 DIAGNOSIS — E039 Hypothyroidism, unspecified: Secondary | ICD-10-CM

## 2017-09-06 DIAGNOSIS — I169 Hypertensive crisis, unspecified: Secondary | ICD-10-CM

## 2017-09-06 DIAGNOSIS — I447 Left bundle-branch block, unspecified: Secondary | ICD-10-CM

## 2017-09-06 DIAGNOSIS — E1110 Type 2 diabetes mellitus with ketoacidosis without coma: Secondary | ICD-10-CM

## 2017-09-06 DIAGNOSIS — R945 Abnormal results of liver function studies: Secondary | ICD-10-CM

## 2017-09-06 HISTORY — DX: Left bundle-branch block, unspecified: I44.7

## 2017-09-06 LAB — CBC
HCT: 37.6 % — ABNORMAL LOW (ref 39.0–52.0)
Hemoglobin: 11.8 g/dL — ABNORMAL LOW (ref 13.0–17.0)
MCH: 29.4 pg (ref 26.0–34.0)
MCHC: 31.4 g/dL (ref 30.0–36.0)
MCV: 93.8 fL (ref 78.0–100.0)
PLATELETS: 218 10*3/uL (ref 150–400)
RBC: 4.01 MIL/uL — AB (ref 4.22–5.81)
RDW: 16.5 % — AB (ref 11.5–15.5)
WBC: 11.6 10*3/uL — AB (ref 4.0–10.5)

## 2017-09-06 LAB — GLUCOSE, CAPILLARY
GLUCOSE-CAPILLARY: 122 mg/dL — AB (ref 70–99)
GLUCOSE-CAPILLARY: 124 mg/dL — AB (ref 70–99)
GLUCOSE-CAPILLARY: 127 mg/dL — AB (ref 70–99)
GLUCOSE-CAPILLARY: 150 mg/dL — AB (ref 70–99)
GLUCOSE-CAPILLARY: 157 mg/dL — AB (ref 70–99)
GLUCOSE-CAPILLARY: 167 mg/dL — AB (ref 70–99)
GLUCOSE-CAPILLARY: 176 mg/dL — AB (ref 70–99)
GLUCOSE-CAPILLARY: 206 mg/dL — AB (ref 70–99)
Glucose-Capillary: 134 mg/dL — ABNORMAL HIGH (ref 70–99)
Glucose-Capillary: 218 mg/dL — ABNORMAL HIGH (ref 70–99)
Glucose-Capillary: 218 mg/dL — ABNORMAL HIGH (ref 70–99)
Glucose-Capillary: 207 mg/dL — ABNORMAL HIGH (ref 70–99)
Glucose-Capillary: 175 mg/dL — ABNORMAL HIGH (ref 70–99)
Glucose-Capillary: 139 mg/dL — ABNORMAL HIGH (ref 70–99)
Glucose-Capillary: 137 mg/dL — ABNORMAL HIGH (ref 70–99)
Glucose-Capillary: 162 mg/dL — ABNORMAL HIGH (ref 70–99)
Glucose-Capillary: 168 mg/dL — ABNORMAL HIGH (ref 70–99)
Glucose-Capillary: 173 mg/dL — ABNORMAL HIGH (ref 70–99)
Glucose-Capillary: 150 mg/dL — ABNORMAL HIGH (ref 70–99)
Glucose-Capillary: 202 mg/dL — ABNORMAL HIGH (ref 70–99)

## 2017-09-06 LAB — MRSA PCR SCREENING: MRSA BY PCR: NEGATIVE

## 2017-09-06 LAB — BASIC METABOLIC PANEL
Anion gap: 17 — ABNORMAL HIGH (ref 5–15)
Anion gap: 20 — ABNORMAL HIGH (ref 5–15)
Anion gap: 15 (ref 5–15)
BUN: 49 mg/dL — ABNORMAL HIGH (ref 6–20)
BUN: 52 mg/dL — ABNORMAL HIGH (ref 6–20)
BUN: 53 mg/dL — ABNORMAL HIGH (ref 6–20)
CALCIUM: 8.5 mg/dL — AB (ref 8.9–10.3)
CALCIUM: 8.9 mg/dL (ref 8.9–10.3)
CHLORIDE: 94 mmol/L — AB (ref 98–111)
CHLORIDE: 92 mmol/L — AB (ref 98–111)
CHLORIDE: 94 mmol/L — AB (ref 98–111)
CO2: 26 mmol/L (ref 22–32)
CO2: 23 mmol/L (ref 22–32)
CO2: 27 mmol/L (ref 22–32)
CREATININE: 6.7 mg/dL — AB (ref 0.61–1.24)
Calcium: 8.7 mg/dL — ABNORMAL LOW (ref 8.9–10.3)
Creatinine, Ser: 7.11 mg/dL — ABNORMAL HIGH (ref 0.61–1.24)
Creatinine, Ser: 7.58 mg/dL — ABNORMAL HIGH (ref 0.61–1.24)
GFR, EST AFRICAN AMERICAN: 10 mL/min — AB (ref >60)
GFR, EST AFRICAN AMERICAN: 10 mL/min — AB (ref >60)
GFR, EST AFRICAN AMERICAN: 9 mL/min — AB (ref >60)
GFR, EST NON AFRICAN AMERICAN: 9 mL/min — AB (ref >60)
GFR, EST NON AFRICAN AMERICAN: 8 mL/min — AB (ref >60)
GFR, EST NON AFRICAN AMERICAN: 8 mL/min — AB (ref >60)
Glucose, Bld: 145 mg/dL — ABNORMAL HIGH (ref 70–99)
Glucose, Bld: 218 mg/dL — ABNORMAL HIGH (ref 70–99)
Glucose, Bld: 176 mg/dL — ABNORMAL HIGH (ref 70–99)
POTASSIUM: 3.8 mmol/L (ref 3.5–5.1)
Potassium: 4.3 mmol/L (ref 3.5–5.1)
Potassium: 4.2 mmol/L (ref 3.5–5.1)
SODIUM: 137 mmol/L (ref 135–145)
SODIUM: 135 mmol/L (ref 135–145)
Sodium: 136 mmol/L (ref 135–145)

## 2017-09-06 LAB — POCT I-STAT, CHEM 8
BUN: 77 mg/dL — ABNORMAL HIGH (ref 6–20)
CALCIUM ION: 0.91 mmol/L — AB (ref 1.15–1.40)
Chloride: 99 mmol/L (ref 98–111)
Creatinine, Ser: 10.5 mg/dL — ABNORMAL HIGH (ref 0.61–1.24)
Glucose, Bld: 375 mg/dL — ABNORMAL HIGH (ref 70–99)
HEMATOCRIT: 39 % (ref 39.0–52.0)
Hemoglobin: 13.3 g/dL (ref 13.0–17.0)
Potassium: 7 mmol/L (ref 3.5–5.1)
Sodium: 130 mmol/L — ABNORMAL LOW (ref 135–145)
TCO2: 19 mmol/L — ABNORMAL LOW (ref 22–32)

## 2017-09-06 LAB — HEPATIC FUNCTION PANEL
ALT: 375 U/L — AB (ref 0–44)
AST: 365 U/L — AB (ref 15–41)
Albumin: 3.4 g/dL — ABNORMAL LOW (ref 3.5–5.0)
Alkaline Phosphatase: 72 U/L (ref 38–126)
BILIRUBIN DIRECT: 0.4 mg/dL — AB (ref 0.0–0.2)
BILIRUBIN TOTAL: 1.9 mg/dL — AB (ref 0.3–1.2)
Indirect Bilirubin: 1.5 mg/dL — ABNORMAL HIGH (ref 0.3–0.9)
TOTAL PROTEIN: 6.9 g/dL (ref 6.5–8.1)

## 2017-09-06 LAB — TROPONIN I: TROPONIN I: 0.04 ng/mL — AB (ref <0.03)

## 2017-09-06 LAB — HEPATITIS PANEL, ACUTE
HCV Ab: 0.1 s/co ratio (ref 0.0–0.9)
HEP B C IGM: NEGATIVE
Hep A IgM: NEGATIVE
Hepatitis B Surface Ag: NEGATIVE

## 2017-09-06 LAB — ECHOCARDIOGRAM COMPLETE
Height: 74 in
Weight: 4123.48 oz

## 2017-09-06 MED ORDER — INSULIN ASPART 100 UNIT/ML ~~LOC~~ SOLN
0.0000 [IU] | Freq: Three times a day (TID) | SUBCUTANEOUS | Status: DC
  Administered 2017-09-08: 3 [IU] via SUBCUTANEOUS

## 2017-09-06 MED ORDER — METOCLOPRAMIDE HCL 10 MG PO TABS
10.0000 mg | ORAL_TABLET | Freq: Four times a day (QID) | ORAL | Status: DC
  Administered 2017-09-06 – 2017-09-07 (×3): 10 mg via ORAL
  Filled 2017-09-06 (×3): qty 1

## 2017-09-06 MED ORDER — DEXTROSE 5 % IV SOLN
INTRAVENOUS | Status: AC
  Administered 2017-09-06: 10:00:00 via INTRAVENOUS

## 2017-09-06 MED ORDER — AMLODIPINE BESYLATE 5 MG PO TABS
5.0000 mg | ORAL_TABLET | Freq: Every day | ORAL | Status: DC
  Administered 2017-09-06 – 2017-09-08 (×3): 5 mg via ORAL
  Filled 2017-09-06 (×3): qty 1

## 2017-09-06 MED ORDER — METOCLOPRAMIDE HCL 5 MG/ML IJ SOLN
10.0000 mg | Freq: Four times a day (QID) | INTRAMUSCULAR | Status: DC
  Administered 2017-09-06: 10 mg via INTRAVENOUS
  Filled 2017-09-06 (×2): qty 2

## 2017-09-06 MED ORDER — PERFLUTREN LIPID MICROSPHERE
1.0000 mL | INTRAVENOUS | Status: AC | PRN
  Administered 2017-09-06: 3 mL via INTRAVENOUS
  Filled 2017-09-06 (×2): qty 10

## 2017-09-06 MED ORDER — INSULIN ASPART 100 UNIT/ML ~~LOC~~ SOLN
0.0000 [IU] | Freq: Every day | SUBCUTANEOUS | Status: DC
  Administered 2017-09-06: 2 [IU] via SUBCUTANEOUS

## 2017-09-06 MED ORDER — CHLORHEXIDINE GLUCONATE CLOTH 2 % EX PADS
6.0000 | MEDICATED_PAD | Freq: Every day | CUTANEOUS | Status: DC
  Administered 2017-09-06 – 2017-09-07 (×2): 6 via TOPICAL

## 2017-09-06 MED ORDER — INSULIN GLARGINE 100 UNIT/ML ~~LOC~~ SOLN
30.0000 [IU] | Freq: Two times a day (BID) | SUBCUTANEOUS | Status: DC
  Administered 2017-09-06 – 2017-09-08 (×4): 30 [IU] via SUBCUTANEOUS
  Filled 2017-09-06 (×5): qty 0.3

## 2017-09-06 MED ORDER — HYDRALAZINE HCL 20 MG/ML IJ SOLN: 10.0000 mg | Freq: Four times a day (QID) | INTRAMUSCULAR | Status: DC | PRN

## 2017-09-06 NOTE — Progress Notes (Signed)
CRITICAL VALUE ALERT  Critical Value:  troponin 0.04 Date & Time Notied:  09/06/2017 1234  Provider Notified: Dr. Irene Limbo   Orders Received/Actions taken:md notified

## 2017-09-06 NOTE — Progress Notes (Signed)
[facility] Progress Note  Subjective: nausea better, no CP or SOB this am.  Feels tired and weak. K 4.2 this am.   Vitals:   09/06/17 0402 09/06/17 0611 09/06/17 0757 09/06/17 1102  BP: (!) 177/110 130/68 (!) 170/90 (!) 172/92  Pulse:  (!) 101    Resp: 16 19 17    Temp: 98 F (36.7 C)  97.6 F (36.4 C)   TempSrc: Oral  Oral   SpO2: 95% 95%    Weight:      Height:        Inpatient medications: . amLODipine  5 mg Oral Daily  . aspirin  81 mg Oral Daily  . atorvastatin  80 mg Oral q1800  . [START ON 09/07/2017] calcitRIOL  1.25 mcg Oral Q M,W,F-HD  . calcium acetate  2,001 mg Oral TID WC  . DULoxetine  30 mg Oral Daily  . enoxaparin (LOVENOX) injection  30 mg Subcutaneous Q24H  . levothyroxine  75 mcg Oral QAC breakfast  . metoCLOPramide (REGLAN) injection  10 mg Intravenous Q6H  . metoprolol succinate  50 mg Oral BID  . pregabalin  75 mg Oral Daily  . sodium chloride flush  3 mL Intravenous Q12H   . dextrose 50 mL/hr at 09/06/17 1019  . [START ON 09/07/2017] ferric gluconate (FERRLECIT/NULECIT) IV    . insulin (NOVOLIN-R) infusion 4.6 Units/hr (09/06/17 1104)   acetaminophen **OR** acetaminophen, albuterol, calcium carbonate (dosed in mg elemental calcium), camphor-menthol **AND** hydrOXYzine, cyclobenzaprine, dextrose, feeding supplement (NEPRO CARB STEADY), hydrALAZINE, HYDROcodone-acetaminophen, ondansetron **OR** ondansetron (ZOFRAN) IV, promethazine, sodium chloride flush, zolpidem  Iron/TIBC/Ferritin/ %Sat    Component Value Date/Time   IRON 17 (L) 05/17/2016 0711   TIBC 216 (L) 05/17/2016 0711   FERRITIN 139 05/17/2016 0711   IRONPCTSAT 8 (L) 05/17/2016 0711    Exam: General: IN Er  Alert, obese, WD, WN  WM , NAD, calm HEENT: [place]  ,MMM, nonicteric Neck: No jvd , supple Heart: RRR, no r,m,g Lungs: clear on R, rales L base Abdomen: obese, soft , Nt, ND ,mild abd wall edema Ext: trace edema pretib bilat Skin: scattered lower extrem. abrasions old  healed  Neuro: Alert OX3, moves all extrem. Dialysis Access: pos bruit  LFA AVF   Dialysis: [facility] MWF 4h   118kg  2/2.25 bath  Hep 4000  LFA AVF Mircera q 2wks (last given 08/30/17)  hgb 11.3 op hgb  8/28 Calcitriol 1.25 mcg po /HD Parsabiv 5 mg q hd Other Venofer 100 mg q hd (9/02>09/12/17)   Assessment/Plan 1. Hyperkalemia- resolved. Still very weak, not sure why 2. DKA - on protocol, BS' in low 200's this am. Have changed D5 1/2 NS to D5W infusion to avoid vol overload for esrd pt.  3. Nausea/Vomiting /HO Gastroparesis- per admit team 4. ESRD -  HD MWF.  HD tomorrow, min UF.  5. Hypertension/volume- is slightly under dry wt, cramped w/ HD yest no vol excess 6. Anemia of ESRD - HGB 13.3 (esa Given 8/28) no needs now / fu trend continue FE load starting next tx 9/06 7. Metabolic bone disease -  PO calcitriol on HD / parsabiv not avail at [facility] , PHOS 10 or high last 3 labs as op (neede complice diet /binder/ phoslo 3 ac   Ca 9.2  8. DM type 1 -   meds per admit/ Admit glucose 394, Recent uncontrolled  Last hgb A1c 9.1 (07/25/17) 9. Nutrition - Renal/carb mod  Renal vit   [name] MD  [facility] pager [phone]   09/06/2017, 11:43 AM   Recent Labs  Lab 09/05/17 1201  09/06/17 0313 09/06/17 0701  NA 137   < > 137 135  K 5.4*   < > 4.3 4.2  CL 92*   < > 94* 92*  CO2 17*   < > 26 23  GLUCOSE 397*   < > 145* 218*  BUN 79*   < > 49* 52*  CREATININE 9.99*   < > 6.70* 7.11*  CALCIUM 9.2   < > 8.7* 8.5*  PHOS 9.1*  --   --   --   ALBUMIN 3.7  --   --  3.4*   < > = values in this interval not displayed.   Recent Labs  Lab 09/05/17 0758 09/06/17 0701  AST 519* 365*  ALT 310* 375*  ALKPHOS 83 72  BILITOT 1.9* 1.9*  PROT 7.6 6.9   Recent Labs  Lab 09/05/17 0758 09/05/17 0817 09/06/17 0701  WBC 9.6  --  11.6*  NEUTROABS 8.8*  --   --   HGB 12.6* 13.3 11.8*  HCT 40.3 39.0 37.6*  MCV 94.2  --  93.8  PLT 229  --  218

## 2017-09-06 NOTE — Progress Notes (Signed)
  Echocardiogram 2D Echocardiogram has been performed.  Delcie Roch 09/06/2017, 4:32 PM

## 2017-09-06 NOTE — Plan of Care (Signed)
POC reviewed with patient, no acute events throughout shift. CT complete. Pt continues to vomit throughout night. PRN hydralazine X 2, Phenergan X 1, Reglan X 1, no adverse response. Pt up to bathroom  X 1 with 1 assist. RN to report  off to ongoing Rn.

## 2017-09-06 NOTE — Progress Notes (Signed)
PROGRESS NOTE  Russell [name] KGY:[id number] DOB: 09-29-1970 DOA: 09/05/2017 PCP: [name], MD  Brief Narrative: 47 year old man PMH ESRD, diabetes mellitus, DVT, CAD, presented with shortness of breath and weakness, nausea, vomiting.  Admitted for hypertensive crisis, volume overload with hyperkalemia, DKA  Assessment/Plan Hypertensive crisis, volume overload, acute hyperkalemia --BP labile but generally high. Pt reports BP runs high, only on 1 BP med apparently  --continue Toprol XL. Add Norvasc. Continue PRN hydralazine.  DKA, DM, diabetic gastroparesis, nausea, vomiting. --AG still high, CO2 WNL.  No lactic acid or ethanol level checked on admission.  Could be uremia although reports compliance with hemodialysis. Suspect from DKA. --continue insulin infusion, Q4 BMP --transition to Lantus when AG WNL  Elevated LFTs, nausea, vomiting, diarrhea. AST/ALT WNL 05/2017. Denies alcohol use. --Follow-up GI pathogen panel, C. Difficile.  --Hepatitis panel negative.  CT abdomen pelvis no acute process demonstrated.  Mild distal esophageal wall thickening may indicate reflux or esophagitis but EGD 08/2017 was unremarkable. --mild pain RUQ, triggers nausea. Check RUQ u/s rule out cholecystitis --CMP in AM  New LBBB not seen on study 05/21/2017. EKG SR, LVH, LBBB --asymptomatic. No CP. Check troponin and echo. Doubt ACS at this time.  Upper GI motility disorder, EGD 08/07/2017 was unremarkable.  Improved glycemic control was recommended. --takes Reglan QID at home. Will schedule IV.  CIDP.  Receives IVIG infusions to report at home.  Not currently on CellCept apparently. --supportive care  ESRD --HD per nephrology  Hypothyroidism  -- TSH pending. Continue levothyroxine  CAD s/p CABG   Still with nausea, elevated LFTs, hypertensive, DKA. Remain inpatient.  DVT prophylaxis: enoxaparin Code Status: Full Family Communication: none Disposition Plan: home    Brendia Sacks,  MD  [facility] Direct contact: [phone] --Via amion app OR  --[email or web address]; password [email or web address]  7PM-7AM contact night coverage as above 09/06/2017, 10:36 AM  LOS: 1 day   Consultants:  Nephrology   Procedures:  Urgent HD  Antimicrobials:    Interval history/Subjective: Per chart continued to vomit throughout night. Some nausea this AM, no vomiting this AM. No diarrhea this AM. No abd pain, no CP, no SOB.  Objective: Vitals:  Vitals:   09/06/17 0611 09/06/17 0757  BP: 130/68 (!) 170/90  Pulse: (!) 101   Resp: 19 17  Temp:  97.6 F (36.4 C)  SpO2: 95%     Exam:  Constitutional:  . Appears calm, uncomfortable, ill but not toxic Eyes:  . pupils and irises appear normal . Normal lids ENMT:  . grossly normal hearing  . Lips appear normal Respiratory:  . CTA bilaterally, no w/r/r.  . Respiratory effort normal.  Cardiovascular:  . RRR, no m/r/g . No significant LE extremity edema   . Telemetry ST Abdomen:  Marland Kitchen May be distended, soft, mild RUQ and epigastric pain with palpation. Musculoskeletal:  . RUE, LUE, RLE, LLE   o strength and tone normal, no atrophy, no abnormal movements Skin:  . Chronic venous stasis changes BLE lower legs. Psychiatric:  . Mental status o Mood, affect appropriate . judgment and insight appear intact     I have personally reviewed the following:   Labs:  CBG under 300  Potassium within normal limits, anion gap remains high at 20, CO2 within normal limits at 23  AST, ALT remain elevated, some improvement in AST, ALT without significant change.  Lipase was within normal limits.  Alkaline phosphatase within normal limits.  Total bilirubin modestly elevated, without significant change.  CBC unremarkable.  Very minimal leukocytosis 11.6.  Imaging studies:  CT abd/pelvis noted  CXR NAD  Medical tests:  EKG independent review SR, LBBB   Scheduled Meds: . amLODipine  5 mg Oral Daily  . aspirin  81 mg Oral Daily   . atorvastatin  80 mg Oral q1800  . [START ON 09/07/2017] calcitRIOL  1.25 mcg Oral Q M,W,F-HD  . calcium acetate  2,001 mg Oral TID WC  . DULoxetine  30 mg Oral Daily  . enoxaparin (LOVENOX) injection  30 mg Subcutaneous Q24H  . levothyroxine  75 mcg Oral QAC breakfast  . metoCLOPramide (REGLAN) injection  10 mg Intravenous Q6H  . metoprolol succinate  50 mg Oral BID  . pregabalin  75 mg Oral Daily  . sodium chloride flush  3 mL Intravenous Q12H   Continuous Infusions: . dextrose 50 mL/hr at 09/06/17 1019  . [START ON 09/07/2017] ferric gluconate (FERRLECIT/NULECIT) IV    . insulin (NOVOLIN-R) infusion 5.9 Units/hr (09/06/17 1007)    Principal Problem:   Hypertensive crisis Active Problems:   Hypothyroidism (acquired)   Diabetic ketoacidosis without coma associated with type 2 diabetes mellitus (HCC)   CIDP (chronic inflammatory demyelinating polyneuropathy) (HCC)   ESRD on dialysis (HCC)   Elevated LFTs   LBBB (left bundle branch block)   LOS: 1 day

## 2017-09-06 NOTE — Telephone Encounter (Signed)
FYI.  Pt admitted to hospital on 09/05/17 for sob and weakness.  [id number] Dgaddy, CMA

## 2017-09-06 NOTE — Progress Notes (Signed)
Inpatient Diabetes Program Recommendations  AACE/ADA: New Consensus Statement on Inpatient Glycemic Control (2015)  Target Ranges:  Prepandial:   less than 140 mg/dL      Peak postprandial:   less than 180 mg/dL (1-2 hours)      Critically ill patients:  140 - 180 mg/dL   Lab Results  Component Value Date   GLUCAP 168 (H) 09/06/2017   HGBA1C 9.1 (A) 07/26/2017    Review of Glycemic Control  Diabetes history: DM2 Outpatient Diabetes medications: Basaglar 30 units bid, Novolog 5-10 units tidwc Current orders for Inpatient glycemic control: IV insulin, transitioning to SQ.  Briefly spoke with pt, was falling asleep though. Said he checks blood sugars daily at home.  HgbA1C - 9.1%  Orders: Basaglar 30 units bid Novolog 0-15 units tidwc and hs  Will follow-up on 9/6.  Thank you. Ailene Ards, RD, LDN, CDE Inpatient Diabetes Coordinator [phone]

## 2017-09-07 DIAGNOSIS — E1122 Type 2 diabetes mellitus with diabetic chronic kidney disease: Secondary | ICD-10-CM

## 2017-09-07 DIAGNOSIS — I10 Essential (primary) hypertension: Secondary | ICD-10-CM

## 2017-09-07 DIAGNOSIS — N186 End stage renal disease: Secondary | ICD-10-CM

## 2017-09-07 LAB — COMPREHENSIVE METABOLIC PANEL
ALBUMIN: 3.4 g/dL — AB (ref 3.5–5.0)
ALK PHOS: 73 U/L (ref 38–126)
ALT: 338 U/L — ABNORMAL HIGH (ref 0–44)
ANION GAP: 17 — AB (ref 5–15)
AST: 222 U/L — ABNORMAL HIGH (ref 15–41)
BUN: 62 mg/dL — ABNORMAL HIGH (ref 6–20)
CHLORIDE: 92 mmol/L — AB (ref 98–111)
CO2: 25 mmol/L (ref 22–32)
Calcium: 8.9 mg/dL (ref 8.9–10.3)
Creatinine, Ser: 8.91 mg/dL — ABNORMAL HIGH (ref 0.61–1.24)
GFR calc non Af Amer: 6 mL/min — ABNORMAL LOW (ref >60)
GFR, EST AFRICAN AMERICAN: 7 mL/min — AB (ref >60)
GLUCOSE: 142 mg/dL — AB (ref 70–99)
POTASSIUM: 4.3 mmol/L (ref 3.5–5.1)
SODIUM: 134 mmol/L — AB (ref 135–145)
Total Bilirubin: 1.4 mg/dL — ABNORMAL HIGH (ref 0.3–1.2)
Total Protein: 6.8 g/dL (ref 6.5–8.1)

## 2017-09-07 LAB — GLUCOSE, CAPILLARY
GLUCOSE-CAPILLARY: 105 mg/dL — AB (ref 70–99)
GLUCOSE-CAPILLARY: 117 mg/dL — AB (ref 70–99)
Glucose-Capillary: 160 mg/dL — ABNORMAL HIGH (ref 70–99)

## 2017-09-07 LAB — CBC
HCT: 37.6 % — ABNORMAL LOW (ref 39.0–52.0)
Hemoglobin: 12 g/dL — ABNORMAL LOW (ref 13.0–17.0)
MCH: 29.9 pg (ref 26.0–34.0)
MCHC: 31.9 g/dL (ref 30.0–36.0)
MCV: 93.8 fL (ref 78.0–100.0)
PLATELETS: 218 10*3/uL (ref 150–400)
RBC: 4.01 MIL/uL — ABNORMAL LOW (ref 4.22–5.81)
RDW: 16.6 % — ABNORMAL HIGH (ref 11.5–15.5)
WBC: 9.8 10*3/uL (ref 4.0–10.5)

## 2017-09-07 MED ORDER — HEPARIN SODIUM (PORCINE) 1000 UNIT/ML DIALYSIS: 4000.0000 [IU] | Freq: Once | INTRAMUSCULAR | Status: DC

## 2017-09-07 MED ORDER — METOCLOPRAMIDE HCL 10 MG PO TABS
10.0000 mg | ORAL_TABLET | Freq: Three times a day (TID) | ORAL | Status: DC
  Administered 2017-09-07 – 2017-09-08 (×4): 10 mg via ORAL
  Filled 2017-09-07 (×5): qty 1

## 2017-09-07 MED ORDER — PREGABALIN 75 MG PO CAPS
75.0000 mg | ORAL_CAPSULE | Freq: Every day | ORAL | Status: DC
  Administered 2017-09-08: 75 mg via ORAL
  Filled 2017-09-07: qty 1

## 2017-09-07 NOTE — Progress Notes (Signed)
[facility] Progress Note  Subjective: nausea better but "not gone".  No other c/o's.   Vitals:   09/07/17 0830 09/07/17 0900 09/07/17 0930 09/07/17 1000  BP: (!) 186/111 (!) 176/104 (!) 190/110 (!) 181/103  Pulse: 82 79 81 81  Resp:      Temp:      TempSrc:      SpO2:      Weight:      Height:        Inpatient medications: . amLODipine  5 mg Oral Daily  . aspirin  81 mg Oral Daily  . atorvastatin  80 mg Oral q1800  . calcitRIOL  1.25 mcg Oral Q M,W,F-HD  . calcium acetate  2,001 mg Oral TID WC  . Chlorhexidine Gluconate Cloth  6 each Topical Q0600  . DULoxetine  30 mg Oral Daily  . enoxaparin (LOVENOX) injection  30 mg Subcutaneous Q24H  . [START ON 09/08/2017] heparin  4,000 Units Dialysis Once in dialysis  . insulin aspart  0-15 Units Subcutaneous TID WC  . insulin aspart  0-5 Units Subcutaneous QHS  . insulin glargine  30 Units Subcutaneous BID  . levothyroxine  75 mcg Oral QAC breakfast  . metoCLOPramide  10 mg Oral Q6H  . metoprolol succinate  50 mg Oral BID  . pregabalin  75 mg Oral Daily  . sodium chloride flush  3 mL Intravenous Q12H   . ferric gluconate (FERRLECIT/NULECIT) IV     acetaminophen **OR** acetaminophen, albuterol, calcium carbonate (dosed in mg elemental calcium), camphor-menthol **AND** hydrOXYzine, cyclobenzaprine, dextrose, feeding supplement (NEPRO CARB STEADY), hydrALAZINE, HYDROcodone-acetaminophen, ondansetron **OR** ondansetron (ZOFRAN) IV, promethazine, sodium chloride flush, zolpidem  Iron/TIBC/Ferritin/ %Sat    Component Value Date/Time   IRON 17 (L) 05/17/2016 0711   TIBC 216 (L) 05/17/2016 0711   FERRITIN 139 05/17/2016 0711   IRONPCTSAT 8 (L) 05/17/2016 0711    Exam: General: IN Er  Alert, obese Neck: No jvd , supple Heart: RRR, no r,m,g Lungs: clear on R, rales L base Abdomen: obese, soft , Nt, ND ,mild abd wall edema Ext: trace edema pretib bilat Neuro: Alert OX3, moves all extrem. Dialysis Access: pos bruit   LFA AVF   Dialysis: [facility] MWF 4h   118kg  2/2.25 bath  Hep 4000  LFA AVF Mircera q 2wks (last given 08/30/17)  hgb 11.3 op hgb  8/28 Calcitriol 1.25 mcg po /HD Parsabiv 5 mg q hd Other Venofer 100 mg q hd (9/02>09/12/17)   Assessment/Plan 1. Nausea/Vomiting /HO Gastroparesis- improving, per admit team 2. Hyperkalemia- resolved.  3. DKA- sp IV insulin protocol. Improved.  4. ESRD -  HD MWF.  HD today 5. Hypertension/volume- UF as tol on HD today 6. Anemia of ESRD - HGB 13.3 (esa Given 8/28) no needs now / fu trend continue FE load starting next tx 9/06 7. Metabolic bone disease -  PO calcitriol on HD / parsabiv not avail at [facility] , PHOS 10 or high last 3 labs as op (neede complice diet /binder/ phoslo 3 ac   Ca 9.2  8. DM type 1 -   meds per admit/ Admit glucose 394, Recent uncontrolled  Last hgb A1c 9.1 (07/25/17) 9. Nutrition - Renal/carb mod  Renal vit   [name] MD [facility] pager [phone]   09/07/2017, 11:51 AM   Recent Labs  Lab 09/05/17 1201  09/06/17 0701 09/06/17 1054 09/07/17 0510  NA 137   < > 135 136 134*  K 5.4*   < >  4.2 3.8 4.3  CL 92*   < > 92* 94* 92*  CO2 17*   < > 23 27 25   GLUCOSE 397*   < > 218* 176* 142*  BUN 79*   < > 52* 53* 62*  CREATININE 9.99*   < > 7.11* 7.58* 8.91*  CALCIUM 9.2   < > 8.5* 8.9 8.9  PHOS 9.1*  --   --   --   --   ALBUMIN 3.7  --  3.4*  --  3.4*   < > = values in this interval not displayed.   Recent Labs  Lab 09/06/17 0701 09/07/17 0510  AST 365* 222*  ALT 375* 338*  ALKPHOS 72 73  BILITOT 1.9* 1.4*  PROT 6.9 6.8   Recent Labs  Lab 09/05/17 0758  09/06/17 0701 09/07/17 0510  WBC 9.6  --  11.6* 9.8  NEUTROABS 8.8*  --   --   --   HGB 12.6*   < > 11.8* 12.0*  HCT 40.3   < > 37.6* 37.6*  MCV 94.2  --  93.8 93.8  PLT 229  --  218 218   < > = values in this interval not displayed.

## 2017-09-07 NOTE — Progress Notes (Signed)
Russell [name]:[id number] DOB: 1970/01/06 DOA: 09/05/2017 PCP: [name], MD  Brief Narrative: 47 year old man PMH ESRD, diabetes mellitus, DVT, CAD, presented with shortness of breath and weakness, nausea, vomiting.  Admitted for hypertensive crisis, volume overload with hyperkalemia, DKA  Assessment/Plan Malignant hypertension.  Status post hypertensive crisis, volume overload, acute hyperkalemia --Blood pressure continues to run high.  Continue Toprol-XL, Norvasc.   DKA, DM, diabetic gastroparesis, nausea, vomiting. --Resolved.  Continue Lantus, sliding scale insulin.  Elevated LFTs, nausea, vomiting, diarrhea. AST/ALT WNL 05/2017. Denies alcohol use.  Hepatitis panel negative. CT abdomen pelvis no acute process demonstrated.  Mild distal esophageal wall thickening may indicate reflux or esophagitis but EGD 08/2017 was unremarkable.  Right upper quadrant ultrasound unremarkable, except for mild hepatic steatosis. --LFTs trending down.  Abdominal exam benign.  Monitor on diet.  Follow-up as an outpatient.  New LBBB not seen on study 05/21/2017. EKG SR, LVH, LBBB --Remains asymptomatic.  No chest pain.  Trivial troponin elevation of no significance.  Echocardiogram reassuring.  No evidence of ACS.  Follow-up as an outpatient.  Upper GI motility disorder, EGD 08/07/2017 was unremarkable.  Improved glycemic control was recommended. --Continue Reglan QID   CIDP.  Receives IVIG infusions to report at home.  Not currently on CellCept apparently. --supportive care  ESRD --HD per nephrology  Hypothyroidism  --Continue levothyroxine  CAD s/p CABG   Appears improved today.  However blood pressure still high.  Ready for discharge.  Monitor on diet.  Possible discharge tomorrow if blood pressure stable and oral intake stable.  DVT prophylaxis: enoxaparin Code Status: Full Family Communication: none Disposition Plan: home    Brendia Sacks, MD  [facility] Direct contact: [phone] --Via amion app OR  --[email or web address]; password [email or web address]  7PM-7AM contact night coverage as above 09/07/2017, 1:41 PM  LOS: 2 days   Consultants:  Nephrology   Procedures:  Urgent HD  2-D echocardiogram Study Conclusions  - Left ventricle: The cavity size was normal. Systolic function was   mildly reduced. The estimated ejection fraction was in the range   of 45% to 50%. Diffuse hypokinesis. Features are consistent with   a pseudonormal left ventricular filling pattern, with concomitant   abnormal relaxation and increased filling pressure (grade 2   diastolic dysfunction). Doppler parameters are consistent with   elevated ventricular end-diastolic filling pressure. - Aortic valve: Trileaflet; mildly thickened, mildly calcified   leaflets. - Left atrium: The atrium was moderately dilated. - Right ventricle: Systolic function was normal. - Tricuspid valve: There was no regurgitation.  Impressions:  - When compared to the prior study on 03/04/2017 LVEF has improved   from 25-30% to 50-55%.  Antimicrobials:    Interval history/Subjective: Feels ok today. Some nausea. Hungry. No pain. S/p HD today.   Objective: Vitals:  Vitals:   09/07/17 1300 09/07/17 1310  BP: (!) 190/112 (!) 184/103  Pulse: 90   Resp: 20   Temp:    SpO2: 91%     Exam: Constitutional:   . Appears calm and comfortable Respiratory:  . CTA bilaterally, no w/r/r.  . Respiratory effort normal.  Cardiovascular:  . RRR, no m/r/g . No significant LE extremity edema   Abdomen:  . Soft, ndnt Psychiatric:  . Mental status o Mood, affect appropriate  I have personally reviewed the following:   Labs:  CBG stable  BMP noted  AST trending down, 222.  ALT trending down, 338.  Total bilirubin near normal, 1.4.  Alkaline phosphatase  within normal limits.  Troponin with trivial elevation  CBC unremarkable.  WBC within normal limits.  Imaging  studies:  CT abd/pelvis noted  CXR NAD  Medical tests:  EKG independent review SR, LBBB   Scheduled Meds: . amLODipine  5 mg Oral Daily  . aspirin  81 mg Oral Daily  . atorvastatin  80 mg Oral q1800  . calcitRIOL  1.25 mcg Oral Q M,W,F-HD  . calcium acetate  2,001 mg Oral TID WC  . Chlorhexidine Gluconate Cloth  6 each Topical Q0600  . DULoxetine  30 mg Oral Daily  . enoxaparin (LOVENOX) injection  30 mg Subcutaneous Q24H  . insulin aspart  0-15 Units Subcutaneous TID WC  . insulin aspart  0-5 Units Subcutaneous QHS  . insulin glargine  30 Units Subcutaneous BID  . levothyroxine  75 mcg Oral QAC breakfast  . metoCLOPramide  10 mg Oral TID AC & HS  . metoprolol succinate  50 mg Oral BID  . pregabalin  75 mg Oral Daily  . sodium chloride flush  3 mL Intravenous Q12H   Continuous Infusions: . ferric gluconate (FERRLECIT/NULECIT) IV      Principal Problem:   HTN (hypertension), malignant Active Problems:   Hypothyroidism (acquired)   Diabetic ketoacidosis without coma associated with type 2 diabetes mellitus (HCC)   CIDP (chronic inflammatory demyelinating polyneuropathy) (HCC)   ESRD on dialysis (HCC)   Hypertensive crisis   Elevated LFTs   LBBB (left bundle branch block)   LOS: 2 days

## 2017-09-07 NOTE — Care Management Note (Signed)
Case Management Note  Patient Details  Name: SANJIT SCIANDRA MRN: [id number] Date of Birth: 27-Nov-1970  Subjective/Objective:         Admitted with HTN crisis , DKA. Hx of ESRD/ HD MWF, diabetes mellitus, DVT, CAD.    Patsy Canion (Mother) Peggye Ley (Significant other)     [phone]     PCP:Eva Clelia Croft  Action/Plan: Transition to home when medically stable....NCM following for transitional care needs.  Expected Discharge Date:                  Expected Discharge Plan:  Home/Self Care  In-House Referral:     Discharge planning Services     Post Acute Care Choice:    Choice offered to:     DME Arranged:    DME Agency:     HH Arranged:    HH Agency:     Status of Service:  In process, will continue to follow  If discussed at Long Length of Stay Meetings, dates discussed:    Additional Comments:  Epifanio Lesches, RN 09/07/2017, 12:05 PM

## 2017-09-07 NOTE — Progress Notes (Signed)
IV TEAM: Spoke with Denny Peon, RN: Informed her that it is OK for floor RN to push IV meds via port. Flush with normal saline before and after medication. She voiced understanding.

## 2017-09-08 ENCOUNTER — Other Ambulatory Visit: Payer: Self-pay

## 2017-09-08 DIAGNOSIS — R740 Nonspecific elevation of levels of transaminase and lactic acid dehydrogenase [LDH]: Secondary | ICD-10-CM

## 2017-09-08 DIAGNOSIS — Z992 Dependence on renal dialysis: Secondary | ICD-10-CM

## 2017-09-08 LAB — GLUCOSE, CAPILLARY
GLUCOSE-CAPILLARY: 87 mg/dL (ref 70–99)
GLUCOSE-CAPILLARY: 192 mg/dL — AB (ref 70–99)

## 2017-09-08 MED ORDER — AMLODIPINE BESYLATE 5 MG PO TABS: 5.0000 mg | ORAL_TABLET | Freq: Every day | ORAL | 0 refills | Status: DC

## 2017-09-08 MED ORDER — METOPROLOL SUCCINATE ER 25 MG PO TB24: 50.0000 mg | ORAL_TABLET | Freq: Two times a day (BID) | ORAL | Status: DC

## 2017-09-08 MED ORDER — METOCLOPRAMIDE HCL 5 MG PO TABS: 10.0000 mg | ORAL_TABLET | Freq: Two times a day (BID) | ORAL | Status: DC

## 2017-09-08 MED ORDER — HEPARIN SOD (PORK) LOCK FLUSH 100 UNIT/ML IV SOLN
500.0000 [IU] | INTRAVENOUS | Status: AC | PRN
  Administered 2017-09-08: 500 [IU]

## 2017-09-08 MED ORDER — BASAGLAR KWIKPEN 100 UNIT/ML ~~LOC~~ SOPN: 30.0000 [IU] | PEN_INJECTOR | Freq: Two times a day (BID) | SUBCUTANEOUS | Status: DC

## 2017-09-08 NOTE — Plan of Care (Signed)

## 2017-09-08 NOTE — Progress Notes (Addendum)
[facility] Progress Note   Subjective:  Seen in room. No overnight CP or dyspnea. Nausea improved. Says he wants to go home today.   Objective Vitals:   09/07/17 1700 09/07/17 2031 09/07/17 2032 09/08/17 0541  BP: (!) 141/78 109/76  120/72  Pulse: 82 83 83 72  Resp:  18  16  Temp: 97.9 F (36.6 C) 98.1 F (36.7 C)  97.8 F (36.6 C)  TempSrc: Oral Oral  Oral  SpO2:  (!) 89% 90% (!) 89%  Weight:      Height:       Physical Exam General: Obese man, NAD Heart: RRR; no murmur Lungs: CTA anteriorly Abdomen: soft, non-tender Extremities: Trace LE edema Dialysis Access: L AVF + thrill  Additional Objective Labs: Basic Metabolic Panel: Recent Labs  Lab 09/05/17 1201  09/06/17 0701 09/06/17 1054 09/07/17 0510  NA 137   < > 135 136 134*  K 5.4*   < > 4.2 3.8 4.3  CL 92*   < > 92* 94* 92*  CO2 17*   < > 23 27 25   GLUCOSE 397*   < > 218* 176* 142*  BUN 79*   < > 52* 53* 62*  CREATININE 9.99*   < > 7.11* 7.58* 8.91*  CALCIUM 9.2   < > 8.5* 8.9 8.9  PHOS 9.1*  --   --   --   --    < > = values in this interval not displayed.   Liver Function Tests: Recent Labs  Lab 09/05/17 0758 09/05/17 1201 09/06/17 0701 09/07/17 0510  AST 519*  --  365* 222*  ALT 310*  --  375* 338*  ALKPHOS 83  --  72 73  BILITOT 1.9*  --  1.9* 1.4*  PROT 7.6  --  6.9 6.8  ALBUMIN 3.7 3.7 3.4* 3.4*   Recent Labs  Lab 09/05/17 0758  LIPASE 30   CBC: Recent Labs  Lab 09/05/17 0758 09/05/17 0817 09/06/17 0701 09/07/17 0510  WBC 9.6  --  11.6* 9.8  NEUTROABS 8.8*  --   --   --   HGB 12.6* 13.3  13.3 11.8* 12.0*  HCT 40.3 39.0  39.0 37.6* 37.6*  MCV 94.2  --  93.8 93.8  PLT 229  --  218 218   Cardiac Enzymes: Recent Labs  Lab 09/06/17 1054  TROPONINI 0.04*   CBG: Recent Labs  Lab 09/06/17 2222 09/07/17 1243 09/07/17 1737 09/07/17 2203 09/08/17 0837  GLUCAP 202* 105* 117* 160* 87   Studies/Results: US Abdomen Limited Ruq  Result Date:  09/06/2017 CLINICAL DATA:  47 year old male with elevated LFTs. EXAM: ULTRASOUND ABDOMEN LIMITED RIGHT UPPER QUADRANT COMPARISON:  09/05/2017 CT, 07/19/2015 ultrasound and other studies FINDINGS: Gallbladder: The gallbladder is unremarkable. There is no evidence of cholelithiasis or acute cholecystitis. Common bile duct: Diameter: 3.8 mm. No intrahepatic or extrahepatic biliary dilatation. Liver: Slightly increased hepatic echogenicity may represent mild hepatic steatosis. No focal hepatic abnormality identified. Portal vein is patent on color Doppler imaging with normal direction of blood flow towards the liver. IMPRESSION: 1. No acute abnormality 2. Equivocal mild hepatic steatosis. Electronically Signed   By: Harmon Pier M.D.   On: 09/06/2017 13:24   Medications: . ferric gluconate (FERRLECIT/NULECIT) IV 125 mg (09/07/17 1755)   . amLODipine  5 mg Oral Daily  . aspirin  81 mg Oral Daily  . atorvastatin  80 mg Oral q1800  . calcitRIOL  1.25 mcg Oral Q M,W,F-HD  . calcium  acetate  2,001 mg Oral TID WC  . Chlorhexidine Gluconate Cloth  6 each Topical Q0600  . DULoxetine  30 mg Oral Daily  . enoxaparin (LOVENOX) injection  30 mg Subcutaneous Q24H  . insulin aspart  0-15 Units Subcutaneous TID WC  . insulin aspart  0-5 Units Subcutaneous QHS  . insulin glargine  30 Units Subcutaneous BID  . levothyroxine  75 mcg Oral QAC breakfast  . metoCLOPramide  10 mg Oral TID AC & HS  . metoprolol succinate  50 mg Oral BID  . pregabalin  75 mg Oral Daily  . sodium chloride flush  3 mL Intravenous Q12H    Dialysis Orders: [facility] MWF 4h   118kg  2/2.25 bath  Hep 4000  LFA AVF Mircera q 2wks (last given 08/30/17) hgb 11.3 op hgb 8/28 Calcitriol 1.67mcg po/HD Parsabiv 5 mg q hd OtherVenofer 100 mg q hd (9/02>09/12/17)   Assessment/Plan: 1. Nausea/Vomiting /HO Gastroparesis- improving, per admit team 2. Elevated LFTs: Improving. Normal liver/gallbladder on CT. 3. Hyperkalemia (resolved with  HD). 4. DKA- sp IV insulin protocol. Improved.  5. ESRD -HD MWF. Next due on Monday, 9/9. 6. Hypertension/volume: Got well below EDW with HD yesterday - change on d/c (114kg). 7. Anemiaof ESRD: Hgb 12; no ESA for now. 8. Metabolic bone disease: Ca ok, last Phos high. Continue Phoslo as binder - change to alternative as outpatient. Continue PO calcitriol, Parsabiv not on [facility] formulary.  9. DM type 1: BS very high on admit, improving. 10. Nutrition -Renal/carb mod Renal vit  Ozzie Hoyle, PA-C 09/08/2017, 10:20 AM  [facility] Pager: [phone]  Pt seen, examined and agree w A/P as above.  [name] MD [employer] pager [phone]   09/08/2017, 2:06 PM

## 2017-09-08 NOTE — Discharge Summary (Signed)
Physician Discharge Summary  Russell [name] OZD:[id number] DOB: 11-Jul-1970 DOA: 09/05/2017  PCP: [name], MD  Admit date: 09/05/2017 Discharge date: 09/08/2017  Recommendations for Outpatient Follow-up:   Hypertension.  Continue to adjust medications as needed.  Elevated LFTs --LFTs trending down.  Abdominal exam benign.  Monitor on diet.  Follow-up as an outpatient.  Likely secondary to hypertensive crisis.  New LBBB not seen on study 05/21/2017. EKG SR, LVH, LBBB --Remains asymptomatic.  No chest pain.  Trivial troponin elevation of no significance.  Echocardiogram reassuring.  No evidence of ACS.  Consider outpatient evaluation   Follow-up Information    [name], MD. Schedule an appointment as soon as possible for a visit in 1 week(s).   Specialty:  Family Medicine Contact information: [address] [place] [address] [phone]        Lewayne Bunting, MD. Schedule an appointment as soon as possible for a visit in 1 week(s).   Specialty:  Cardiology Contact information: [address] [place] [address] [phone]            Discharge Diagnoses:  1. Hypertensive crisis. 2. Hyperkalemia 3. Volume overload secondary to end-stage renal disease 4. Malignant hypertension 5. DKA, DM, diabetic gastroparesis, nausea, vomiting. 6. Elevated LFTs, nausea, vomiting, diarrhea 7. New LBBB not seen on study 05/21/2017 8. ESRD 9. Hypothyroidism  10. CAD s/p CABG  Discharge Condition: improved Disposition: home  Diet recommendation: heart healthy, diabetic diet  Filed Weights   09/07/17 0745 09/07/17 1200 09/07/17 1400  Weight: 117.8 kg 114.9 kg 114.2 kg    History of present illness:  47 year old man PMH ESRD, diabetes mellitus, DVT, CAD, presented with shortness of breath and weakness, nausea, vomiting.  Admitted for hypertensive crisis, volume overload with hyperkalemia, DKA.  Hospital Course:  Patient underwent emergent  hemodialysis which resolved hyperkalemia.  He remained hypertensive during the early part of his hospital stay.  Blood pressure gradually improved and is stable on discharge.  No evidence of ACS.  DKA corrected with standard therapy.  LFTs were elevated, likely secondary to hypertensive crisis.  Trending down and work-up unremarkable.  Individual issues as below.  Malignant hypertension.  Status post hypertensive crisis, volume overload, acute hyperkalemia --Blood pressure stable.  Continue Toprol-XL.  Norvasc added on discharge.  DKA, DM, diabetic gastroparesis, nausea, vomiting. --Resolved.  Continue Lantus.  No change in insulin dosing.  Elevated LFTs, nausea, vomiting, diarrhea. AST/ALT WNL 05/2017. Denies alcohol use.  Hepatitis panel negative. CT abdomen pelvis no acute process demonstrated.  Mild distal esophageal wall thickening may indicate reflux or esophagitis but EGD 08/2017 was unremarkable.  Right upper quadrant ultrasound unremarkable, except for mild hepatic steatosis. --LFTs trending down.  Abdominal exam benign.  Monitor on diet.  Follow-up as an outpatient.  Likely secondary to hypertensive crisis.  New LBBB not seen on study 05/21/2017. EKG SR, LVH, LBBB --Remains asymptomatic.  No chest pain.  Trivial troponin elevation of no significance.  Echocardiogram reassuring.  No evidence of ACS.  Follow-up as an outpatient.  Upper GI motility disorder, EGD 08/07/2017 was unremarkable.  Improved glycemic control was recommended. --Continue Reglan QID   CIDP.  Receives IVIG infusions to report at home.  Not currently on CellCept apparently. --supportive care  ESRD --HD per nephrology  Hypothyroidism  --Continue levothyroxine  CAD s/p CABG  Consultants:  Nephrology   Procedures:  Urgent HD  2-D echocardiogram Study Conclusions  - Left ventricle: The cavity size was normal. Systolic function  was mildly reduced. The estimated ejection fraction was in the  range of 45% to 50%. Diffuse hypokinesis. Features are consistent with a pseudonormal left ventricular filling pattern, with concomitant abnormal relaxation and increased filling pressure (grade 2 diastolic dysfunction). Doppler parameters are consistent with elevated ventricular end-diastolic filling pressure. - Aortic valve: Trileaflet; mildly thickened, mildly calcified leaflets. - Left atrium: The atrium was moderately dilated. - Right ventricle: Systolic function was normal. - Tricuspid valve: There was no regurgitation.  Impressions:  - When compared to the prior study on 03/04/2017 LVEF has improved from 25-30% to 50-55%.  Today's assessment: S: feels well, eating well, no pain. O: Vitals:  Vitals:   09/08/17 0541 09/08/17 1413  BP: 120/72 133/84  Pulse: 72 74  Resp: 16   Temp: 97.8 F (36.6 C) 97.9 F (36.6 C)  SpO2: (!) 89% 98%    Constitutional:  . Appears calm and comfortable Respiratory:  . CTA bilaterally, no w/r/r.  . Respiratory effort normal.  Cardiovascular:  . RRR, no m/r/g . No LE extremity edema   Psychiatric:  . judgement and insight appear normal . Mental status o Mood, affect appropriate  CBG stable.  Discharge Instructions  Discharge Instructions    Activity as tolerated - No restrictions   Complete by:  As directed    Diet - low sodium heart healthy   Complete by:  As directed    Diet Carb Modified   Complete by:  As directed    Discharge instructions   Complete by:  As directed    Call your physician or seek immediate medical attention for pain, fever, blood sugar over 400 or less than 70, or worsening of condition.     Allergies as of 09/08/2017      Reactions   Nsaids Other (See Comments)   Told to avoid all nsaids due to kidney disease    Tape Other (See Comments)   Welts result, if left for a long amount of time      Medication List    TAKE these medications   acetaminophen 325 MG tablet Commonly known  as:  TYLENOL Take 1-2 tablets (325-650 mg total) by mouth every 4 (four) hours as needed for mild pain.   albuterol 108 (90 Base) MCG/ACT inhaler Commonly known as:  PROVENTIL HFA;VENTOLIN HFA Inhale 1 puff into the lungs every 6 (six) hours as needed for wheezing or shortness of breath. What changed:    how much to take  when to take this   amLODipine 5 MG tablet Commonly known as:  NORVASC Take 1 tablet (5 mg total) by mouth daily. Start taking on:  09/09/2017   aspirin 81 MG EC tablet Take 1 tablet (81 mg total) by mouth daily.   atorvastatin 80 MG tablet Commonly known as:  LIPITOR Take 1 tablet (80 mg total) by mouth daily at 6 PM.   BASAGLAR KWIKPEN 100 UNIT/ML Sopn Inject 0.3 mLs (30 Units total) into the skin 2 (two) times daily.   calcitRIOL 0.25 MCG capsule Commonly known as:  ROCALTROL Take 5 capsules (1.25 mcg total) by mouth every Monday, Wednesday, and Friday with hemodialysis.   calcium acetate 667 MG capsule Commonly known as:  PHOSLO Take 2,001 mg by mouth 3 (three) times daily with meals.   cyclobenzaprine 10 MG tablet Commonly known as:  FLEXERIL Take 10 mg by mouth 2 (two) times daily as needed for muscle spasms.   DULoxetine 30 MG capsule Commonly known as:  CYMBALTA Take 1  capsule (30 mg total) by mouth daily.   HYDROcodone-acetaminophen 5-325 MG tablet Commonly known as:  NORCO/VICODIN Take 1 tablet by mouth every 12 (twelve) hours as needed for severe pain. What changed:  when to take this   insulin aspart 100 UNIT/ML FlexPen Commonly known as:  NOVOLOG Inject 8-20 Units into the skin 3 (three) times daily with meals. What changed:    how much to take  additional instructions   levothyroxine 75 MCG tablet Commonly known as:  SYNTHROID, LEVOTHROID Take 1 tablet (75 mcg total) by mouth daily before breakfast.   metoCLOPramide 5 MG tablet Commonly known as:  REGLAN Take 2 tablets (10 mg total) by mouth 2 (two) times daily.    metoprolol succinate 25 MG 24 hr tablet Commonly known as:  TOPROL-XL Take 2 tablets (50 mg total) by mouth 2 (two) times daily.   Pen Needles 32G X 4 MM Misc 1 Units by Does not apply route 6 (six) times daily. As needed and as directed by physician   pregabalin 75 MG capsule Commonly known as:  LYRICA Take 1 capsule (75 mg total) by mouth daily.      Allergies  Allergen Reactions  . Nsaids Other (See Comments)    Told to avoid all nsaids due to kidney disease   . Tape Other (See Comments)    Welts result, if left for a long amount of time    The results of significant diagnostics from this hospitalization (including imaging, microbiology, ancillary and laboratory) are listed below for reference.    Significant Diagnostic Studies: Dg Chest 2 View  Result Date: 09/05/2017 CLINICAL DATA:  Nausea vomiting and diarrhea EXAM: CHEST - 2 VIEW COMPARISON:  09/05/2017, 05/27/2017, PET-CT 04/16/2017, CT chest 03/01/2017 FINDINGS: Right-sided central venous port tip at the distal SVC. Post sternotomy changes. No pleural effusion. Cardiomegaly with mild central vascular congestion. No pneumothorax. IMPRESSION: No active cardiopulmonary disease. Cardiomegaly with mild central vascular congestion. Electronically Signed   By: Jasmine Pang M.D.   On: 09/05/2017 17:54   Ct Abdomen Pelvis W Contrast  Result Date: 09/06/2017 CLINICAL DATA:  Epigastric pain with nausea and vomiting beginning yesterday. EXAM: CT ABDOMEN AND PELVIS WITH CONTRAST TECHNIQUE: Multidetector CT imaging of the abdomen and pelvis was performed using the standard protocol following bolus administration of intravenous contrast. CONTRAST:  ISOVUE-300 IOPAMIDOL (ISOVUE-300) INJECTION 61% COMPARISON:  PET-CT 04/16/2017. CT abdomen and pelvis 04/27/2016 FINDINGS: Lower chest: Mild diffuse mosaic attenuation pattern in the lower lungs may be due to motion artifact, air trapping, or airways disease. Distal esophagus is mildly  thickened suggesting reflux or esophagitis. Hepatobiliary: No focal liver abnormality is seen. No gallstones, gallbladder wall thickening, or biliary dilatation. Pancreas: Unremarkable. No pancreatic ductal dilatation or surrounding inflammatory changes. Spleen: Normal in size without focal abnormality. Adrenals/Urinary Tract: No adrenal gland nodules. Kidneys are atrophic bilaterally with horseshoe configuration. No hydronephrosis or hydroureter. Calcification and scarring adjacent to the left kidney corresponding to cystic or inflammatory structure previously. Bladder is mostly decompressed. Stomach/Bowel: Stomach, small bowel, and colon are not abnormally distended. No wall thickening is appreciated although under distention limits evaluation. Appendix is not identified. Vascular/Lymphatic: Scattered aortic calcifications. No significant lymphadenopathy. Reproductive: Prostate is unremarkable. Calcifications in the vas deferens. Other: No abdominal wall hernia or abnormality. No abdominopelvic ascites. Musculoskeletal: Degenerative changes in the spine. No destructive bone lesions. Schmorl's nodes. IMPRESSION: 1. No acute process demonstrated in the abdomen or pelvis. No evidence of bowel obstruction or inflammation. 2. Mild distal esophageal  wall thickening may indicate reflux or esophagitis. 3. Mild diffuse mosaic attenuation pattern in the lung bases may be due to motion artifact, air trapping, or airways disease. 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 Electronically Signed   By: Burman Nieves M.D.   On: 09/06/2017 00:13   Dg Chest Portable 1 View  Result Date: 09/05/2017 CLINICAL DATA:  Shortness of breath EXAM: PORTABLE CHEST 1 VIEW COMPARISON:  May 17, 2017 FINDINGS: Port-A-Cath tip is at the cavoatrial junction. No pneumothorax. There is atelectatic change in the mid lung and basilar regions. There is no edema or consolidation. There is cardiomegaly with mild pulmonary venous  hypertension. Patient is status post coronary artery bypass grafting. No adenopathy evident. No evident bone lesions. IMPRESSION: Port-A-Cath as described without pneumothorax. Pulmonary vascular congestion without edema or consolidation. Mild atelectatic change in the mid lower lung zones. Status post coronary artery bypass grafting. Electronically Signed   By: Bretta Bang III M.D.   On: 09/05/2017 08:00   US Abdomen Limited Ruq  Result Date: 09/06/2017 CLINICAL DATA:  47 year old male with elevated LFTs. EXAM: ULTRASOUND ABDOMEN LIMITED RIGHT UPPER QUADRANT COMPARISON:  09/05/2017 CT, 07/19/2015 ultrasound and other studies FINDINGS: Gallbladder: The gallbladder is unremarkable. There is no evidence of cholelithiasis or acute cholecystitis. Common bile duct: Diameter: 3.8 mm. No intrahepatic or extrahepatic biliary dilatation. Liver: Slightly increased hepatic echogenicity may represent mild hepatic steatosis. No focal hepatic abnormality identified. Portal vein is patent on color Doppler imaging with normal direction of blood flow towards the liver. IMPRESSION: 1. No acute abnormality 2. Equivocal mild hepatic steatosis. Electronically Signed   By: Harmon Pier M.D.   On: 09/06/2017 13:24    Microbiology: Recent Results (from the past 240 hour(s))  MRSA PCR Screening     Status: None   Collection Time: 09/05/17  8:02 PM  Result Value Ref Range Status   MRSA by PCR NEGATIVE NEGATIVE Final    Comment:        The GeneXpert MRSA Assay (FDA approved for NASAL specimens only), is one component of a comprehensive MRSA colonization surveillance program. It is not intended to diagnose MRSA infection nor to guide or monitor treatment for MRSA infections. Performed at [facility], [address] [place], [place] [address]      Labs: Basic Metabolic Panel: Recent Labs  Lab 09/05/17 1201  09/05/17 2311 09/06/17 0313 09/06/17 0701 09/06/17 1054 09/07/17 0510  NA 137   < > 138  137 135 136 134*  K 5.4*   < > 4.4 4.3 4.2 3.8 4.3  CL 92*   < > 92* 94* 92* 94* 92*  CO2 17*   < > 25 26 23 27 25   GLUCOSE 397*   < > 168* 145* 218* 176* 142*  BUN 79*   < > 46* 49* 52* 53* 62*  CREATININE 9.99*   < > 6.68* 6.70* 7.11* 7.58* 8.91*  CALCIUM 9.2   < > 9.0 8.7* 8.5* 8.9 8.9  PHOS 9.1*  --   --   --   --   --   --    < > = values in this interval not displayed.   Liver Function Tests: Recent Labs  Lab 09/05/17 0758 09/05/17 1201 09/06/17 0701 09/07/17 0510  AST 519*  --  365* 222*  ALT 310*  --  375* 338*  ALKPHOS 83  --  72 73  BILITOT 1.9*  --  1.9* 1.4*  PROT 7.6  --  6.9 6.8  ALBUMIN 3.7 3.7 3.4* 3.4*   Recent Labs  Lab 09/05/17 0758  LIPASE 30   CBC: Recent Labs  Lab 09/05/17 0758 09/05/17 0817 09/06/17 0701 09/07/17 0510  WBC 9.6  --  11.6* 9.8  NEUTROABS 8.8*  --   --   --   HGB 12.6* 13.3  13.3 11.8* 12.0*  HCT 40.3 39.0  39.0 37.6* 37.6*  MCV 94.2  --  93.8 93.8  PLT 229  --  218 218   Cardiac Enzymes: Recent Labs  Lab 09/06/17 1054  TROPONINI 0.04*   CBG: Recent Labs  Lab 09/07/17 1243 09/07/17 1737 09/07/17 2203 09/08/17 0837 09/08/17 1257  GLUCAP 105* 117* 160* 87 192*    Principal Problem:   HTN (hypertension), malignant Active Problems:   Hypothyroidism (acquired)   Diabetic ketoacidosis without coma associated with type 2 diabetes mellitus (HCC)   CIDP (chronic inflammatory demyelinating polyneuropathy) (HCC)   ESRD on dialysis (HCC)   Hypertensive crisis   Elevated LFTs   LBBB (left bundle branch block)   Time coordinating discharge: 35 minutes  Signed:  Brendia Sacks, MD [facility] 09/08/2017, 3:50 PM

## 2017-09-10 DIAGNOSIS — E1129 Type 2 diabetes mellitus with other diabetic kidney complication: Secondary | ICD-10-CM | POA: Diagnosis not present

## 2017-09-10 DIAGNOSIS — N2581 Secondary hyperparathyroidism of renal origin: Secondary | ICD-10-CM | POA: Diagnosis not present

## 2017-09-10 DIAGNOSIS — D631 Anemia in chronic kidney disease: Secondary | ICD-10-CM | POA: Diagnosis not present

## 2017-09-10 DIAGNOSIS — D509 Iron deficiency anemia, unspecified: Secondary | ICD-10-CM | POA: Diagnosis not present

## 2017-09-10 DIAGNOSIS — N186 End stage renal disease: Secondary | ICD-10-CM | POA: Diagnosis not present

## 2017-09-12 DIAGNOSIS — N186 End stage renal disease: Secondary | ICD-10-CM | POA: Diagnosis not present

## 2017-09-12 DIAGNOSIS — E1129 Type 2 diabetes mellitus with other diabetic kidney complication: Secondary | ICD-10-CM | POA: Diagnosis not present

## 2017-09-12 DIAGNOSIS — D631 Anemia in chronic kidney disease: Secondary | ICD-10-CM | POA: Diagnosis not present

## 2017-09-12 DIAGNOSIS — N2581 Secondary hyperparathyroidism of renal origin: Secondary | ICD-10-CM | POA: Diagnosis not present

## 2017-09-12 DIAGNOSIS — D509 Iron deficiency anemia, unspecified: Secondary | ICD-10-CM | POA: Diagnosis not present

## 2017-09-14 DIAGNOSIS — D509 Iron deficiency anemia, unspecified: Secondary | ICD-10-CM | POA: Diagnosis not present

## 2017-09-14 DIAGNOSIS — N186 End stage renal disease: Secondary | ICD-10-CM | POA: Diagnosis not present

## 2017-09-14 DIAGNOSIS — N2581 Secondary hyperparathyroidism of renal origin: Secondary | ICD-10-CM | POA: Diagnosis not present

## 2017-09-14 DIAGNOSIS — D631 Anemia in chronic kidney disease: Secondary | ICD-10-CM | POA: Diagnosis not present

## 2017-09-14 DIAGNOSIS — E1129 Type 2 diabetes mellitus with other diabetic kidney complication: Secondary | ICD-10-CM | POA: Diagnosis not present

## 2017-09-17 DIAGNOSIS — D631 Anemia in chronic kidney disease: Secondary | ICD-10-CM | POA: Diagnosis not present

## 2017-09-17 DIAGNOSIS — N2581 Secondary hyperparathyroidism of renal origin: Secondary | ICD-10-CM | POA: Diagnosis not present

## 2017-09-17 DIAGNOSIS — D509 Iron deficiency anemia, unspecified: Secondary | ICD-10-CM | POA: Diagnosis not present

## 2017-09-17 DIAGNOSIS — N186 End stage renal disease: Secondary | ICD-10-CM | POA: Diagnosis not present

## 2017-09-17 DIAGNOSIS — E1129 Type 2 diabetes mellitus with other diabetic kidney complication: Secondary | ICD-10-CM | POA: Diagnosis not present

## 2017-09-19 DIAGNOSIS — E1129 Type 2 diabetes mellitus with other diabetic kidney complication: Secondary | ICD-10-CM | POA: Diagnosis not present

## 2017-09-19 DIAGNOSIS — D509 Iron deficiency anemia, unspecified: Secondary | ICD-10-CM | POA: Diagnosis not present

## 2017-09-19 DIAGNOSIS — N186 End stage renal disease: Secondary | ICD-10-CM | POA: Diagnosis not present

## 2017-09-19 DIAGNOSIS — N2581 Secondary hyperparathyroidism of renal origin: Secondary | ICD-10-CM | POA: Diagnosis not present

## 2017-09-19 DIAGNOSIS — D631 Anemia in chronic kidney disease: Secondary | ICD-10-CM | POA: Diagnosis not present

## 2017-09-21 DIAGNOSIS — D631 Anemia in chronic kidney disease: Secondary | ICD-10-CM | POA: Diagnosis not present

## 2017-09-21 DIAGNOSIS — D509 Iron deficiency anemia, unspecified: Secondary | ICD-10-CM | POA: Diagnosis not present

## 2017-09-21 DIAGNOSIS — N2581 Secondary hyperparathyroidism of renal origin: Secondary | ICD-10-CM | POA: Diagnosis not present

## 2017-09-21 DIAGNOSIS — N186 End stage renal disease: Secondary | ICD-10-CM | POA: Diagnosis not present

## 2017-09-21 DIAGNOSIS — E1129 Type 2 diabetes mellitus with other diabetic kidney complication: Secondary | ICD-10-CM | POA: Diagnosis not present

## 2017-09-23 NOTE — Progress Notes (Signed)
Cardiology Office Note   Date:  09/25/2017   ID:  Russell [name], DOB [birth year], MRN [id number]  PCP:  [name], MD  Cardiologist: Dr. Jens Som Chief Complaint  Patient presents with  . Shortness of Breath    when active  . Edema  . Congestive Heart Failure     History of Present Illness: Russell [name] is a 47 y.o. male who presents for ongoing assessment and management of coronary artery disease, with history of prior CABG, chronic combined systolic and diastolic heart failure, hypertension, with other history to include type 2 diabetes, and end-stage renal disease.  The patient had cardiac catheterization on 03/06/2017 that did not reveal significant coronary artery disease but was found to have volume overload.  It was recommended by cardiology that the patient's volume removal be increased through dialysis.  We are seeing the patient posthospitalization after admission for hypertensive crisis, hyperkalemia, and volume overload.  The patient was initially admitted for severe shortness of breath weakness nausea and vomiting as well as significant hypertension.  The patient underwent emergent hemodialysis which resolved hyperkalemia.  He was also found to have DKA.  Echocardiogram was completed during hospitalization which revealed EF of 45% to 50% with diffuse hypokinesis, grade 2 diastolic dysfunction.  When compared to prior study in March 2019 LVEF had improved from 25% to 30%.  He comes today with uncontrolled hypertension, LEE, and redness and pain in the bilateral pretibial area. He states that he is unhappy with his BP despite dialysis. He does not restrict his salt intake.   Past Medical History:  Diagnosis Date  . Anxiety   . Asthma   . 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 Coronary artery disease involving native coronary artery of native heart with unstable angina pectoris (HCC)    80% LAD-95% oD1 bifurcation lesion & 90% RI -->  referred for CABG  . Daily headache   . Depression   . DVT (deep venous thrombosis), H/o 01/2014-on Xarelto 03/24/2014   LLE  . ESRD (end stage renal disease) on dialysis [facility])    "[facility], MWF" (06/29/2016)  . Gait abnormality 12/22/2016  . Gastroparesis 12/22/2016  . Hypertension   . Hypothyroidism   . LBBB (left bundle branch block) 09/06/2017  . Nephrotic syndrome 05/18/2014  . Neuropathy   . Type 2 diabetes mellitus with diabetic nephropathy [facility])     Past Surgical History:  Procedure Laterality Date  . ANKLE FRACTURE SURGERY Right 1988  . AV FISTULA PLACEMENT Left 01/01/2015   Procedure: CREATION OF LEFT RADIAL CEPHALIC ARTERIOVENOUS (AV) FISTULA ;  Surgeon: Pryor Ochoa, MD;  Location: [facility] OR;  Service: Vascular;  Laterality: Left;  . BIOPSY  08/07/2017   Procedure: BIOPSY;  Surgeon: Sherrilyn Rist, MD;  Location: [facility]COPY;  Service: Gastroenterology;;  . CAPD REMOVAL N/A 05/19/2016   Procedure: PD CATH REMOVAL;  Surgeon: Abigail Miyamoto, MD;  Location: [facility] OR;  Service: General;  Laterality: N/A;  . CARDIAC CATHETERIZATION N/A 09/22/2015   Procedure: Left Heart Cath and Coronary Angiography;  Surgeon: Iran Ouch, MD;  Location: [facility] INVASIVE CV LAB;  Service: Cardiovascular;  Laterality: N/A;  . CORONARY ARTERY BYPASS GRAFT N/A 09/28/2015   Procedure: CORONARY ARTERY BYPASS GRAFTING (CABG) x 3 UTILIZING LEFT MAMMARY ARTERY AND ENDOSCOPICALLY HARVESTED LEFT GREATER SAPHENOUS VEIN.;  Surgeon: Delight Ovens, MD;  Location: [facility] OR;  Service: Open Heart Surgery;  Laterality: N/A;  . ENDOVEIN HARVEST OF GREATER SAPHENOUS VEIN Left  09/28/2015   Procedure: ENDOVEIN HARVEST OF GREATER SAPHENOUS VEIN;  Surgeon: Delight Ovens, MD;  Location: [facility] OR;  Service: Open Heart Surgery;  Laterality: Left;  . ESOPHAGOGASTRODUODENOSCOPY N/A 03/29/2014   Procedure: ESOPHAGOGASTRODUODENOSCOPY (EGD);  Surgeon: Dorena Cookey, MD;  Location: [facility];  Service: Endoscopy;  Laterality: N/A;   . ESOPHAGOGASTRODUODENOSCOPY (EGD) WITH PROPOFOL N/A 08/07/2017   Procedure: ESOPHAGOGASTRODUODENOSCOPY (EGD) WITH PROPOFOL;  Surgeon: Sherrilyn Rist, MD;  Location: [facility] ENDOSCOPY;  Service: Gastroenterology;  Laterality: N/A;  . EYE SURGERY    . FRACTURE SURGERY    . INSERTION OF DIALYSIS CATHETER Right 01/05/2015   Procedure: INSERTION OF RIGHT INTERNAL JUGULAR DIALYSIS CATHETER;  Surgeon: Fransisco Hertz, MD;  Location: [facility] OR;  Service: Vascular;  Laterality: Right;  . IR IMAGING GUIDED PORT INSERTION  06/19/2017  . LEFT HEART CATH AND CORS/GRAFTS ANGIOGRAPHY N/A 09/13/2016   Procedure: LEFT HEART CATH AND CORS/GRAFTS ANGIOGRAPHY;  Surgeon: Kathleene Hazel, MD;  Location: [facility] INVASIVE CV LAB;  Service: Cardiovascular;  Laterality: N/A;  . RETINAL LASER PROCEDURE Bilateral   . RIGHT/LEFT HEART CATH AND CORONARY/GRAFT ANGIOGRAPHY N/A 03/06/2017   Procedure: RIGHT/LEFT HEART CATH AND CORONARY/GRAFT ANGIOGRAPHY;  Surgeon: Marykay Lex, MD;  Location: [facility] INVASIVE CV LAB;  Service: Cardiovascular;  Laterality: N/A;  . TEE WITHOUT CARDIOVERSION N/A 09/28/2015   Procedure: TRANSESOPHAGEAL ECHOCARDIOGRAM (TEE);  Surgeon: Delight Ovens, MD;  Location: [facility] OR;  Service: Open Heart Surgery;  Laterality: N/A;  . TONSILLECTOMY AND ADENOIDECTOMY  1970s     Current Outpatient Medications  Medication Sig Dispense Refill  . acetaminophen (TYLENOL) 325 MG tablet Take 1-2 tablets (325-650 mg total) by mouth every 4 (four) hours as needed for mild pain.    Marland Kitchen albuterol (PROVENTIL HFA;VENTOLIN HFA) 108 (90 Base) MCG/ACT inhaler Inhale 1 puff into the lungs every 6 (six) hours as needed for wheezing or shortness of breath. (Patient taking differently: Inhale 2 puffs into the lungs every 8 (eight) hours as needed for wheezing or shortness of breath. ) 1 Inhaler 11  . amLODipine (NORVASC) 10 MG tablet Take 1 tablet (10 mg total) by mouth daily. 30 tablet 5  . aspirin EC 81 MG EC tablet Take 1 tablet (81 mg  total) by mouth daily.    Marland Kitchen atorvastatin (LIPITOR) 80 MG tablet Take 1 tablet (80 mg total) by mouth daily at 6 PM. 90 tablet 3  . calcitRIOL (ROCALTROL) 0.25 MCG capsule Take 5 capsules (1.25 mcg total) by mouth every Monday, Wednesday, and Friday with hemodialysis.    Marland Kitchen calcium acetate (PHOSLO) 667 MG capsule Take 2,001 mg by mouth 3 (three) times daily with meals.    . cyclobenzaprine (FLEXERIL) 10 MG tablet Take 10 mg by mouth 2 (two) times daily as needed for muscle spasms.    . DULoxetine (CYMBALTA) 30 MG capsule Take 1 capsule (30 mg total) by mouth daily. 90 capsule 1  . HYDROcodone-acetaminophen (NORCO/VICODIN) 5-325 MG tablet Take 1 tablet by mouth every 12 (twelve) hours as needed for severe pain. (Patient taking differently: Take 1 tablet by mouth every 6 (six) hours as needed for severe pain. ) 10 tablet 0  . insulin aspart (NOVOLOG FLEXPEN) 100 UNIT/ML FlexPen Inject 8-20 Units into the skin 3 (three) times daily with meals. (Patient taking differently: Inject 5-10 Units into the skin 3 (three) times daily with meals. Per sliding scale) 15 mL 11  . Insulin Glargine (BASAGLAR KWIKPEN) 100 UNIT/ML SOPN Inject 0.3 mLs (30 Units total) into  the skin 2 (two) times daily.    . Insulin Pen Needle (PEN NEEDLES) 32G X 4 MM MISC 1 Units by Does not apply route 6 (six) times daily. As needed and as directed by physician 540 each 4  . levothyroxine (SYNTHROID, LEVOTHROID) 75 MCG tablet Take 1 tablet (75 mcg total) by mouth daily before breakfast. 90 tablet 3  . metoCLOPramide (REGLAN) 5 MG tablet Take 2 tablets (10 mg total) by mouth 2 (two) times daily.    . metoprolol succinate (TOPROL-XL) 25 MG 24 hr tablet Take 2 tablets (50 mg total) by mouth 2 (two) times daily.    . pregabalin (LYRICA) 75 MG capsule Take 1 capsule (75 mg total) by mouth daily. 90 capsule 1  . cephALEXin (KEFLEX) 500 MG capsule Take 1 capsule (500 mg total) by mouth 2 (two) times daily for 10 days. 20 capsule 0   No current  facility-administered medications for this visit.     Allergies:   Nsaids and Tape    Social History:  The patient  reports that he has never smoked. He has never used smokeless tobacco. He reports that he drinks alcohol. He reports that he does not use drugs.   Family History:  The patient's family history includes Cancer (age of onset: 4750) in his father; Heart disease in his unknown relative; Kidney cancer in his maternal grandmother; Obesity in his mother.    ROS: All other systems are reviewed and negative. Unless otherwise mentioned in H&P    PHYSICAL EXAM: VS:  BP (!) 142/100   Pulse 60   Ht 6\' 2"  (1.88 m)   Wt 270 lb 6.4 oz (122.7 kg)   BMI 34.72 kg/m  , BMI Body mass index is 34.72 kg/m. GEN: Well nourished, well developed, in no acute distress  HEENT: normal  Neck: no JVD, carotid bruits, or masses Cardiac: RRR tachycardic; no murmurs, rubs, or gallops,no edema  Respiratory:  clear to auscultation bilaterally, normal work of breathing GI: soft, nontender, nondistended, + BS MS: no deformity or atrophy Feet have blisters on the left plantar toe mound.  Skin: warm and dry, no rash. Laceration of the right pre-tibial area with bilateral erythema and tenderness. Toes are bluish on his feet bilaterally. Left arm dialysis fistula noted.  Neuro:  Strength and sensation are intact.Numbness to his feet bilaterally  Psych: euthymic mood, full affect   EKG: Not completed this office visit.   Recent Labs: 03/22/2017: Magnesium 2.0 07/26/2017: TSH 5.03 09/07/2017: ALT 338; BUN 62; Creatinine, Ser 8.91; Hemoglobin 12.0; Platelets 218; Potassium 4.3; Sodium 134    Lipid Panel    Component Value Date/Time   CHOL 134 03/21/2017 0745   CHOL 228 (H) 04/17/2016 0900   TRIG 113 03/21/2017 0745   HDL 40 (L) 03/21/2017 0745   HDL 33 (L) 04/17/2016 0900   CHOLHDL 3.4 03/21/2017 0745   VLDL 23 03/21/2017 0745   LDLCALC 71 03/21/2017 0745   LDLCALC 126 (H) 04/17/2016 0900      Wt  Readings from Last 3 Encounters:  09/25/17 270 lb 6.4 oz (122.7 kg)  09/07/17 251 lb 12.3 oz (114.2 kg)  08/07/17 257 lb 12.8 oz (116.9 kg)      Other studies Reviewed: Echocardiogram 09/06/2017 Left ventricle: The cavity size was normal. Systolic function was   mildly reduced. The estimated ejection fraction was in the range   of 45% to 50%. Diffuse hypokinesis. Features are consistent with   a pseudonormal left ventricular filling pattern,  with concomitant   abnormal relaxation and increased filling pressure (grade 2   diastolic dysfunction). Doppler parameters are consistent with   elevated ventricular end-diastolic filling pressure. - Aortic valve: Trileaflet; mildly thickened, mildly calcified   leaflets. - Left atrium: The atrium was moderately dilated. - Right ventricle: Systolic function was normal. - Tricuspid valve: There was no regurgitation.  Impressions:  - When compared to the prior study on 03/04/2017 LVEF has improved   from 25-30% to 50-55%.  Cardiac Cath 03/06/3017  Prox LAD to Mid LAD lesion is 80% stenosed.  LIMA graft was visualized by angiography and is normal in caliber. The graft exhibits no disease.  Ost 1st Diag lesion is 99% stenosed.  SVG-Diag1 graft was visualized by angiography and is large. The graft exhibits no disease.  Ost Ramus lesion is 95% stenosed.  SVG-Ramus graft was visualized by angiography and is large. The graft exhibits no disease.  There is severe left ventricular systolic dysfunction. The left ventricular ejection fraction is 25-35% by visual estimate. Cardiac output 4.12 with index of 1.72.  Hemodynamic findings consistent with moderate pulmonary hypertension. Secondary to pulmonary venous hypertension.  LV end diastolic pressure is severely elevated.    Stable coronary arteries, suspect reduced EF is more volume/non-macrovascular ischemia related  Severely diseased LAD, D1 and RI. Widely patent grafts, normal right  coronary artery..  Moderate pulmonary hypertension with PA pressure 49/18 mmHg with mean pressure of 35 mmHg.  Elevated LVEDP of 30 mmHg with PCWP 28 mmHg-->this correlates with elevated RVEDP of 19 mmHg and RA pressure of 16-18 mmHg.  Cardiac output by thermal dilution is 4.12 with an index of 1.72. This is more commensurate with a EF of roughly 35% and global hypokinesis. (Cardiac output by FICK does not appear to be accurate)   ASSESSMENT AND PLAN:  1.  Hypertension: Not well controlled despite dialysis. I will increase amlodipine to 10 mg daily from 5 mg daily. I checked his BP in the exam room and found it to be 168/88.   2. Chronic Systolic CHF: EF now 45%. He has depended edema noted with evidence of mild cellulitis bilaterally and a laceration to his right pre-tibial area. I will start him on Keflex 500 mg BID for 10 days.   He is advised to wear shoes that are cushioned, diabetic shoes, or shoes that are not rubber, causing blisters on his feet from friction. The shoes are too tight.   3. Diminished peripheral pulses. I did not palpate pulses but was able to auscultate pulses with doppler. Diminished on the left compared to the right. I will check ABI's to be thorough.   4. ESRD: He has dialysis M, W, F. Dry weight 119 kg.   5.Diabetes:  Followed by PCP   Current medicines are reviewed at length with the patient today.    Labs/ tests ordered today include: ABI's  Bettey Mare. Liborio Nixon, ANP, AACC   09/25/2017 12:03 PM    [facility] [address], [place], [place] [address] Phone: [phone]; Fax: [phone]

## 2017-09-24 DIAGNOSIS — E1129 Type 2 diabetes mellitus with other diabetic kidney complication: Secondary | ICD-10-CM | POA: Diagnosis not present

## 2017-09-24 DIAGNOSIS — D509 Iron deficiency anemia, unspecified: Secondary | ICD-10-CM | POA: Diagnosis not present

## 2017-09-24 DIAGNOSIS — N2581 Secondary hyperparathyroidism of renal origin: Secondary | ICD-10-CM | POA: Diagnosis not present

## 2017-09-24 DIAGNOSIS — D631 Anemia in chronic kidney disease: Secondary | ICD-10-CM | POA: Diagnosis not present

## 2017-09-24 DIAGNOSIS — N186 End stage renal disease: Secondary | ICD-10-CM | POA: Diagnosis not present

## 2017-09-25 ENCOUNTER — Ambulatory Visit (INDEPENDENT_AMBULATORY_CARE_PROVIDER_SITE_OTHER): Payer: Medicare Other | Admitting: Adult Health

## 2017-09-25 ENCOUNTER — Ambulatory Visit: Payer: Medicare Other | Admitting: Sports Medicine

## 2017-09-25 ENCOUNTER — Encounter

## 2017-09-25 ENCOUNTER — Encounter: Payer: Self-pay | Admitting: Adult Health

## 2017-09-25 VITALS — BP 142/100 | HR 60 | Ht 74.0 in | Wt 270.4 lb

## 2017-09-25 DIAGNOSIS — M79609 Pain in unspecified limb: Secondary | ICD-10-CM

## 2017-09-25 DIAGNOSIS — I2510 Atherosclerotic heart disease of native coronary artery without angina pectoris: Secondary | ICD-10-CM | POA: Diagnosis not present

## 2017-09-25 DIAGNOSIS — L03119 Cellulitis of unspecified part of limb: Secondary | ICD-10-CM

## 2017-09-25 DIAGNOSIS — R0989 Other specified symptoms and signs involving the circulatory and respiratory systems: Secondary | ICD-10-CM

## 2017-09-25 DIAGNOSIS — I10 Essential (primary) hypertension: Secondary | ICD-10-CM | POA: Diagnosis not present

## 2017-09-25 MED ORDER — CEPHALEXIN 500 MG PO CAPS: 500.0000 mg | ORAL_CAPSULE | Freq: Two times a day (BID) | ORAL | 0 refills | Status: AC

## 2017-09-25 MED ORDER — AMLODIPINE BESYLATE 10 MG PO TABS: 10.0000 mg | ORAL_TABLET | Freq: Every day | ORAL | 5 refills | Status: AC

## 2017-09-25 NOTE — Patient Instructions (Signed)
Medication Instructions:  TAKE KEFLEX 500MG  TWICE DAILY FOR 10 DAYS.  INCREASE AMLODIPINE 10MG  DAILY  If you need a refill on your cardiac medications before your next appointment, please call your pharmacy.  Testing/Procedures: Your physician has requested that you have an ankle brachial index (ABI). During this test an ultrasound and blood pressure cuff are used to evaluate the arteries that supply the arms and legs with blood. Allow thirty minutes for this exam. There are no restrictions or special instructions.  Special Instructions: MAKE SURE TO FOLLOW UP AND DISCUSS YOUR FEET WITH YOUR PRIMARY CARE PHYSICIAN  Follow-Up: Your physician wants you to follow-up in: 1 MONTH KATHRYN LAWRENCE DNP(NURSE PRACTITIONER),AACC IF PRIMARY CARDIOLOGIST (CRENSHAW) IS UNAVAILABLE.    Thank you for choosing [facility] at [facility]!!

## 2017-09-26 DIAGNOSIS — N186 End stage renal disease: Secondary | ICD-10-CM | POA: Diagnosis not present

## 2017-09-26 DIAGNOSIS — N2581 Secondary hyperparathyroidism of renal origin: Secondary | ICD-10-CM | POA: Diagnosis not present

## 2017-09-26 DIAGNOSIS — D509 Iron deficiency anemia, unspecified: Secondary | ICD-10-CM | POA: Diagnosis not present

## 2017-09-26 DIAGNOSIS — E1129 Type 2 diabetes mellitus with other diabetic kidney complication: Secondary | ICD-10-CM | POA: Diagnosis not present

## 2017-09-26 DIAGNOSIS — D631 Anemia in chronic kidney disease: Secondary | ICD-10-CM | POA: Diagnosis not present

## 2017-09-28 DIAGNOSIS — N2581 Secondary hyperparathyroidism of renal origin: Secondary | ICD-10-CM | POA: Diagnosis not present

## 2017-09-28 DIAGNOSIS — D509 Iron deficiency anemia, unspecified: Secondary | ICD-10-CM | POA: Diagnosis not present

## 2017-09-28 DIAGNOSIS — E1129 Type 2 diabetes mellitus with other diabetic kidney complication: Secondary | ICD-10-CM | POA: Diagnosis not present

## 2017-09-28 DIAGNOSIS — N186 End stage renal disease: Secondary | ICD-10-CM | POA: Diagnosis not present

## 2017-09-28 DIAGNOSIS — D631 Anemia in chronic kidney disease: Secondary | ICD-10-CM | POA: Diagnosis not present

## 2017-10-01 DIAGNOSIS — D509 Iron deficiency anemia, unspecified: Secondary | ICD-10-CM | POA: Diagnosis not present

## 2017-10-01 DIAGNOSIS — N186 End stage renal disease: Secondary | ICD-10-CM | POA: Diagnosis not present

## 2017-10-01 DIAGNOSIS — N2581 Secondary hyperparathyroidism of renal origin: Secondary | ICD-10-CM | POA: Diagnosis not present

## 2017-10-01 DIAGNOSIS — E1129 Type 2 diabetes mellitus with other diabetic kidney complication: Secondary | ICD-10-CM | POA: Diagnosis not present

## 2017-10-01 DIAGNOSIS — D631 Anemia in chronic kidney disease: Secondary | ICD-10-CM | POA: Diagnosis not present

## 2017-10-02 ENCOUNTER — Ambulatory Visit: Payer: Medicare Other | Admitting: Physician Assistant

## 2017-10-02 DIAGNOSIS — Z992 Dependence on renal dialysis: Secondary | ICD-10-CM | POA: Diagnosis not present

## 2017-10-02 DIAGNOSIS — N186 End stage renal disease: Secondary | ICD-10-CM | POA: Diagnosis not present

## 2017-10-02 DIAGNOSIS — N049 Nephrotic syndrome with unspecified morphologic changes: Secondary | ICD-10-CM | POA: Diagnosis not present

## 2017-10-03 DIAGNOSIS — D631 Anemia in chronic kidney disease: Secondary | ICD-10-CM | POA: Diagnosis not present

## 2017-10-03 DIAGNOSIS — N2581 Secondary hyperparathyroidism of renal origin: Secondary | ICD-10-CM | POA: Diagnosis not present

## 2017-10-03 DIAGNOSIS — E1129 Type 2 diabetes mellitus with other diabetic kidney complication: Secondary | ICD-10-CM | POA: Diagnosis not present

## 2017-10-03 DIAGNOSIS — N186 End stage renal disease: Secondary | ICD-10-CM | POA: Diagnosis not present

## 2017-10-03 DIAGNOSIS — Z23 Encounter for immunization: Secondary | ICD-10-CM | POA: Diagnosis not present

## 2017-10-05 DIAGNOSIS — D631 Anemia in chronic kidney disease: Secondary | ICD-10-CM | POA: Diagnosis not present

## 2017-10-05 DIAGNOSIS — N2581 Secondary hyperparathyroidism of renal origin: Secondary | ICD-10-CM | POA: Diagnosis not present

## 2017-10-05 DIAGNOSIS — Z23 Encounter for immunization: Secondary | ICD-10-CM | POA: Diagnosis not present

## 2017-10-05 DIAGNOSIS — E113512 Type 2 diabetes mellitus with proliferative diabetic retinopathy with macular edema, left eye: Secondary | ICD-10-CM | POA: Diagnosis not present

## 2017-10-05 DIAGNOSIS — H4311 Vitreous hemorrhage, right eye: Secondary | ICD-10-CM | POA: Diagnosis not present

## 2017-10-05 DIAGNOSIS — E1129 Type 2 diabetes mellitus with other diabetic kidney complication: Secondary | ICD-10-CM | POA: Diagnosis not present

## 2017-10-05 DIAGNOSIS — E113591 Type 2 diabetes mellitus with proliferative diabetic retinopathy without macular edema, right eye: Secondary | ICD-10-CM | POA: Diagnosis not present

## 2017-10-05 DIAGNOSIS — H35371 Puckering of macula, right eye: Secondary | ICD-10-CM | POA: Diagnosis not present

## 2017-10-05 DIAGNOSIS — N186 End stage renal disease: Secondary | ICD-10-CM | POA: Diagnosis not present

## 2017-10-08 DIAGNOSIS — Z23 Encounter for immunization: Secondary | ICD-10-CM | POA: Diagnosis not present

## 2017-10-08 DIAGNOSIS — N186 End stage renal disease: Secondary | ICD-10-CM | POA: Diagnosis not present

## 2017-10-08 DIAGNOSIS — E1129 Type 2 diabetes mellitus with other diabetic kidney complication: Secondary | ICD-10-CM | POA: Diagnosis not present

## 2017-10-08 DIAGNOSIS — D631 Anemia in chronic kidney disease: Secondary | ICD-10-CM | POA: Diagnosis not present

## 2017-10-08 DIAGNOSIS — N2581 Secondary hyperparathyroidism of renal origin: Secondary | ICD-10-CM | POA: Diagnosis not present

## 2017-10-10 DIAGNOSIS — D631 Anemia in chronic kidney disease: Secondary | ICD-10-CM | POA: Diagnosis not present

## 2017-10-10 DIAGNOSIS — N186 End stage renal disease: Secondary | ICD-10-CM | POA: Diagnosis not present

## 2017-10-10 DIAGNOSIS — Z23 Encounter for immunization: Secondary | ICD-10-CM | POA: Diagnosis not present

## 2017-10-10 DIAGNOSIS — N2581 Secondary hyperparathyroidism of renal origin: Secondary | ICD-10-CM | POA: Diagnosis not present

## 2017-10-10 DIAGNOSIS — E1129 Type 2 diabetes mellitus with other diabetic kidney complication: Secondary | ICD-10-CM | POA: Diagnosis not present

## 2017-10-11 ENCOUNTER — Ambulatory Visit (HOSPITAL_COMMUNITY)
Admission: RE | Admit: 2017-10-11 | Payer: Medicare Other | Source: Ambulatory Visit | Attending: Adult Health | Admitting: Adult Health

## 2017-10-12 DIAGNOSIS — Z23 Encounter for immunization: Secondary | ICD-10-CM | POA: Diagnosis not present

## 2017-10-12 DIAGNOSIS — N2581 Secondary hyperparathyroidism of renal origin: Secondary | ICD-10-CM | POA: Diagnosis not present

## 2017-10-12 DIAGNOSIS — D631 Anemia in chronic kidney disease: Secondary | ICD-10-CM | POA: Diagnosis not present

## 2017-10-12 DIAGNOSIS — N186 End stage renal disease: Secondary | ICD-10-CM | POA: Diagnosis not present

## 2017-10-12 DIAGNOSIS — E1129 Type 2 diabetes mellitus with other diabetic kidney complication: Secondary | ICD-10-CM | POA: Diagnosis not present

## 2017-10-15 DIAGNOSIS — Z23 Encounter for immunization: Secondary | ICD-10-CM | POA: Diagnosis not present

## 2017-10-15 DIAGNOSIS — E1129 Type 2 diabetes mellitus with other diabetic kidney complication: Secondary | ICD-10-CM | POA: Diagnosis not present

## 2017-10-15 DIAGNOSIS — N186 End stage renal disease: Secondary | ICD-10-CM | POA: Diagnosis not present

## 2017-10-15 DIAGNOSIS — D631 Anemia in chronic kidney disease: Secondary | ICD-10-CM | POA: Diagnosis not present

## 2017-10-15 DIAGNOSIS — N2581 Secondary hyperparathyroidism of renal origin: Secondary | ICD-10-CM | POA: Diagnosis not present

## 2017-10-17 DIAGNOSIS — N2581 Secondary hyperparathyroidism of renal origin: Secondary | ICD-10-CM | POA: Diagnosis not present

## 2017-10-17 DIAGNOSIS — E1129 Type 2 diabetes mellitus with other diabetic kidney complication: Secondary | ICD-10-CM | POA: Diagnosis not present

## 2017-10-17 DIAGNOSIS — N186 End stage renal disease: Secondary | ICD-10-CM | POA: Diagnosis not present

## 2017-10-17 DIAGNOSIS — Z23 Encounter for immunization: Secondary | ICD-10-CM | POA: Diagnosis not present

## 2017-10-17 DIAGNOSIS — D631 Anemia in chronic kidney disease: Secondary | ICD-10-CM | POA: Diagnosis not present

## 2017-10-18 ENCOUNTER — Encounter: Payer: Medicare Other | Admitting: Family Medicine

## 2017-10-19 DIAGNOSIS — E1129 Type 2 diabetes mellitus with other diabetic kidney complication: Secondary | ICD-10-CM | POA: Diagnosis not present

## 2017-10-19 DIAGNOSIS — N2581 Secondary hyperparathyroidism of renal origin: Secondary | ICD-10-CM | POA: Diagnosis not present

## 2017-10-19 DIAGNOSIS — Z23 Encounter for immunization: Secondary | ICD-10-CM | POA: Diagnosis not present

## 2017-10-19 DIAGNOSIS — N186 End stage renal disease: Secondary | ICD-10-CM | POA: Diagnosis not present

## 2017-10-19 DIAGNOSIS — D631 Anemia in chronic kidney disease: Secondary | ICD-10-CM | POA: Diagnosis not present

## 2017-10-22 DIAGNOSIS — N2581 Secondary hyperparathyroidism of renal origin: Secondary | ICD-10-CM | POA: Diagnosis not present

## 2017-10-22 DIAGNOSIS — N186 End stage renal disease: Secondary | ICD-10-CM | POA: Diagnosis not present

## 2017-10-22 DIAGNOSIS — Z23 Encounter for immunization: Secondary | ICD-10-CM | POA: Diagnosis not present

## 2017-10-22 DIAGNOSIS — D631 Anemia in chronic kidney disease: Secondary | ICD-10-CM | POA: Diagnosis not present

## 2017-10-22 DIAGNOSIS — E1129 Type 2 diabetes mellitus with other diabetic kidney complication: Secondary | ICD-10-CM | POA: Diagnosis not present

## 2017-10-24 DIAGNOSIS — N186 End stage renal disease: Secondary | ICD-10-CM | POA: Diagnosis not present

## 2017-10-24 DIAGNOSIS — Z23 Encounter for immunization: Secondary | ICD-10-CM | POA: Diagnosis not present

## 2017-10-24 DIAGNOSIS — N2581 Secondary hyperparathyroidism of renal origin: Secondary | ICD-10-CM | POA: Diagnosis not present

## 2017-10-24 DIAGNOSIS — E1129 Type 2 diabetes mellitus with other diabetic kidney complication: Secondary | ICD-10-CM | POA: Diagnosis not present

## 2017-10-24 DIAGNOSIS — D631 Anemia in chronic kidney disease: Secondary | ICD-10-CM | POA: Diagnosis not present

## 2017-10-25 ENCOUNTER — Ambulatory Visit: Payer: Medicare Other | Admitting: Adult Health

## 2017-10-25 ENCOUNTER — Encounter (HOSPITAL_COMMUNITY): Payer: Medicare Other

## 2017-10-26 DIAGNOSIS — N186 End stage renal disease: Secondary | ICD-10-CM | POA: Diagnosis not present

## 2017-10-26 DIAGNOSIS — D631 Anemia in chronic kidney disease: Secondary | ICD-10-CM | POA: Diagnosis not present

## 2017-10-26 DIAGNOSIS — Z23 Encounter for immunization: Secondary | ICD-10-CM | POA: Diagnosis not present

## 2017-10-26 DIAGNOSIS — E1129 Type 2 diabetes mellitus with other diabetic kidney complication: Secondary | ICD-10-CM | POA: Diagnosis not present

## 2017-10-26 DIAGNOSIS — N2581 Secondary hyperparathyroidism of renal origin: Secondary | ICD-10-CM | POA: Diagnosis not present

## 2017-10-29 DIAGNOSIS — D631 Anemia in chronic kidney disease: Secondary | ICD-10-CM | POA: Diagnosis not present

## 2017-10-29 DIAGNOSIS — E1129 Type 2 diabetes mellitus with other diabetic kidney complication: Secondary | ICD-10-CM | POA: Diagnosis not present

## 2017-10-29 DIAGNOSIS — N186 End stage renal disease: Secondary | ICD-10-CM | POA: Diagnosis not present

## 2017-10-29 DIAGNOSIS — Z23 Encounter for immunization: Secondary | ICD-10-CM | POA: Diagnosis not present

## 2017-10-29 DIAGNOSIS — N2581 Secondary hyperparathyroidism of renal origin: Secondary | ICD-10-CM | POA: Diagnosis not present

## 2017-10-30 ENCOUNTER — Ambulatory Visit (INDEPENDENT_AMBULATORY_CARE_PROVIDER_SITE_OTHER): Payer: Medicare Other | Admitting: Neurology

## 2017-10-30 ENCOUNTER — Encounter: Payer: Self-pay | Admitting: Neurology

## 2017-10-30 VITALS — BP 130/86 | HR 91 | Resp 18 | Ht 74.0 in | Wt 265.0 lb

## 2017-10-30 DIAGNOSIS — M21371 Foot drop, right foot: Secondary | ICD-10-CM | POA: Diagnosis not present

## 2017-10-30 DIAGNOSIS — G6181 Chronic inflammatory demyelinating polyneuritis: Secondary | ICD-10-CM

## 2017-10-30 DIAGNOSIS — R269 Unspecified abnormalities of gait and mobility: Secondary | ICD-10-CM | POA: Diagnosis not present

## 2017-10-30 DIAGNOSIS — Z5181 Encounter for therapeutic drug level monitoring: Secondary | ICD-10-CM | POA: Diagnosis not present

## 2017-10-30 DIAGNOSIS — I2510 Atherosclerotic heart disease of native coronary artery without angina pectoris: Secondary | ICD-10-CM

## 2017-10-30 DIAGNOSIS — E1142 Type 2 diabetes mellitus with diabetic polyneuropathy: Secondary | ICD-10-CM

## 2017-10-30 DIAGNOSIS — M21372 Foot drop, left foot: Secondary | ICD-10-CM | POA: Diagnosis not present

## 2017-10-30 HISTORY — DX: Foot drop, right foot: M21.371

## 2017-10-30 HISTORY — DX: Foot drop, right foot: M21.372

## 2017-10-30 MED ORDER — MYCOPHENOLATE MOFETIL 250 MG PO CAPS: 250.0000 mg | ORAL_CAPSULE | Freq: Two times a day (BID) | ORAL | 5 refills | Status: AC

## 2017-10-30 NOTE — Progress Notes (Signed)
Reason for visit: CIDP, diabetic peripheral neuropathy  Russell [name] is an 47 y.o. male  History of present illness:  Russell [name] is a 47 year old right-handed white male with a history of diabetes with associated diabetic peripheral neuropathy and bilateral foot drops.  The patient has documented CIDP with proximal weakness that has significantly improved with the use of IVIG.  The patient has recently had trouble with gastroparesis, he has retinal disease and decreased vision in the right eye.  The patient uses a cane for ambulation, he has not had any falls.  He was to go on CellCept, but he never got started on the medication.  The medication was approved through insurance.  The patient has recently been in the hospital on 05 September 2017 with malignant hypertension and congestive heart failure and fluid overload.  The patient was noted to have elevated liver enzymes that were coming down the time of discharge.  The patient returns for an evaluation.  He still gets good benefit with the IVIG, he will feel the medication wearing off a few days before the next dose.  He gets therapy every 3 weeks.  Past Medical History:  Diagnosis Date  . Anxiety   . Asthma   . 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 Daily headache   . Depression   . DVT (deep venous thrombosis), H/o 01/2014-on Xarelto 03/24/2014   LLE  . ESRD (end stage renal disease) on dialysis [facility])    "[place], MWF" (06/29/2016)  . Gait abnormality 12/22/2016  . Gastroparesis 12/22/2016  . Hypertension   . Hypothyroidism   . LBBB (left bundle branch block) 09/06/2017  . Nephrotic syndrome 05/18/2014  . Neuropathy   . Type 2 diabetes mellitus with diabetic nephropathy [facility])     Past Surgical History:    Procedure Laterality Date  . ANKLE FRACTURE SURGERY Right 1988  . AV FISTULA PLACEMENT Left 01/01/2015   Procedure: CREATION OF LEFT RADIAL CEPHALIC ARTERIOVENOUS (AV) FISTULA ;  Surgeon: Pryor Ochoa, MD;  Location: [facility] OR;  Service: Vascular;  Laterality: Left;  . BIOPSY  08/07/2017   Procedure: BIOPSY;  Surgeon: Sherrilyn Rist, MD;  Location: [facility];  Service: Gastroenterology;;  . CAPD REMOVAL N/A 05/19/2016   Procedure: PD CATH REMOVAL;  Surgeon: Abigail Miyamoto, MD;  Location: [facility] OR;  Service: General;  Laterality: N/A;  . CARDIAC CATHETERIZATION N/A 09/22/2015   Procedure: Left Heart Cath and Coronary Angiography;  Surgeon: Iran Ouch, MD;  [facility]: [facility] INVASIVE CV LAB;  Service: Cardiovascular;  Laterality: N/A;  . CORONARY ARTERY BYPASS GRAFT N/A 09/28/2015   Procedure: CORONARY ARTERY BYPASS GRAFTING (CABG) x 3 UTILIZING LEFT MAMMARY ARTERY AND ENDOSCOPICALLY HARVESTED LEFT GREATER SAPHENOUS VEIN.;  Surgeon: Delight Ovens, MD;  Location: [facility] OR;  Service: Open Heart Surgery;  Laterality: N/A;  . ENDOVEIN HARVEST OF GREATER SAPHENOUS VEIN Left 09/28/2015   Procedure: ENDOVEIN HARVEST OF GREATER SAPHENOUS VEIN;  Surgeon: Delight Ovens, MD;  Location[facility] OR;  Service: Open Heart Surgery;  Laterality: Left;  . ESOPHAGOGASTRODUODENOSCOPY N/A 03/29/2014   Procedure: ESOPHAGOGASTRODUODENOSCOPY (EGD);  Surgeon: Dorena Cookey, MD;  Location: [facility];  Service: Endoscopy;  Laterality: N/A;  . ESOPHAGOGASTRODUODENOSCOPY (EGD) WITH PROPOFOL N/A 08/07/2017   Procedure: ESOPHAGOGASTRODUODENOSCOPY (EGD) WITH PROPOFOL;  Surgeon:  Sherrilyn Rist, MD;  Location: [facility];  Service: Gastroenterology;  Laterality: N/A;  . EYE SURGERY    . FRACTURE SURGERY    . INSERTION OF DIALYSIS CATHETER Right 01/05/2015   Procedure: INSERTION OF RIGHT INTERNAL JUGULAR DIALYSIS CATHETER;  Surgeon: Fransisco Hertz, MD;  [facility] OR;  Service: Vascular;  Laterality: Right;  . IR IMAGING GUIDED  PORT INSERTION  06/19/2017  . LEFT HEART CATH AND CORS/GRAFTS ANGIOGRAPHY N/A 09/13/2016   Procedure: LEFT HEART CATH AND CORS/GRAFTS ANGIOGRAPHY;  Surgeon: Kathleene Hazel, MD;  [facility] INVASIVE CV LAB;  Service: Cardiovascular;  Laterality: N/A;  . RETINAL LASER PROCEDURE Bilateral   . RIGHT/LEFT HEART CATH AND CORONARY/GRAFT ANGIOGRAPHY N/A 03/06/2017   Procedure: RIGHT/LEFT HEART CATH AND CORONARY/GRAFT ANGIOGRAPHY;  Surgeon: Marykay Lex, MD;  Location: [facility] INVASIVE CV LAB;  Service: Cardiovascular;  Laterality: N/A;  . TEE WITHOUT CARDIOVERSION N/A 09/28/2015   Procedure: TRANSESOPHAGEAL ECHOCARDIOGRAM (TEE);  Surgeon: Delight Ovens, MD;  Location: [facility] OR;  Service: Open Heart Surgery;  Laterality: N/A;  . TONSILLECTOMY AND ADENOIDECTOMY  1970s    Family History  Problem Relation Age of Onset  . Obesity Mother        Patient states that family members have no other medical illnesses other than what I have described  . Kidney cancer Maternal Grandmother   . Cancer Father 50       AML  . Heart disease Unknown        No family history    Social history:  reports that he has never smoked. He has never used smokeless tobacco. He reports that he drinks alcohol. He reports that he does not use drugs.    Allergies  Allergen Reactions  . Nsaids Other (See Comments)    Told to avoid all nsaids due to kidney disease   . Tape Other (See Comments)    Welts result, if left for a long amount of time    Medications:  Prior to Admission medications   Medication Sig Start Date End Date Taking? Authorizing Provider  acetaminophen (TYLENOL) 325 MG tablet Take 1-2 tablets (325-650 mg total) by mouth every 4 (four) hours as needed for mild pain. 06/01/17  Yes Love, Evlyn Kanner, PA-C  albuterol (PROVENTIL HFA;VENTOLIN HFA) 108 (90 Base) MCG/ACT inhaler Inhale 1 puff into the lungs every 6 (six) hours as needed for wheezing or shortness of breath. Patient taking differently: Inhale 2  puffs into the lungs every 8 (eight) hours as needed for wheezing or shortness of breath.  03/11/15  Yes [name], MD  amLODipine (NORVASC) 10 MG tablet Take 1 tablet (10 mg total) by mouth daily. 09/25/17 10/26/18 Yes Jodelle Gross, NP  aspirin EC 81 MG EC tablet Take 1 tablet (81 mg total) by mouth daily. 10/06/15  Yes Barrett, Erin R, PA-C  atorvastatin (LIPITOR) 80 MG tablet Take 1 tablet (80 mg total) by mouth daily at 6 PM. 01/05/17  Yes Azalee Course, PA  calcitRIOL (ROCALTROL) 0.25 MCG capsule Take 5 capsules (1.25 mcg total) by mouth every Monday, Wednesday, and Friday with hemodialysis. 06/01/17  Yes Love, Evlyn Kanner, PA-C  calcium acetate (PHOSLO) 667 MG capsule Take 2,001 mg by mouth 3 (three) times daily with meals.   Yes [provider]  cyclobenzaprine (FLEXERIL) 10 MG tablet Take 10 mg by mouth 2 (two) times daily as needed for muscle spasms.   Yes [provider]  DULoxetine (CYMBALTA) 30 MG capsule Take  1 capsule (30 mg total) by mouth daily. 06/12/17  Yes [name], MD  HYDROcodone-acetaminophen (NORCO/VICODIN) 5-325 MG tablet Take 1 tablet by mouth every 12 (twelve) hours as needed for severe pain. Patient taking differently: Take 1 tablet by mouth every 6 (six) hours as needed for severe pain.  06/01/17  Yes Love, Evlyn Kanner, PA-C  insulin aspart (NOVOLOG FLEXPEN) 100 UNIT/ML FlexPen Inject 8-20 Units into the skin 3 (three) times daily with meals. Patient taking differently: Inject 5-10 Units into the skin 3 (three) times daily with meals. Per sliding scale 04/23/17  Yes Carlus Pavlov, MD  Insulin Glargine (BASAGLAR KWIKPEN) 100 UNIT/ML SOPN Inject 0.3 mLs (30 Units total) into the skin 2 (two) times daily. 09/08/17  Yes Standley Brooking, MD  Insulin Pen Needle (PEN NEEDLES) 32G X 4 MM MISC 1 Units by Does not apply route 6 (six) times daily. As needed and as directed by physician 06/12/17  Yes [name], MD  levothyroxine (SYNTHROID, LEVOTHROID) 75 MCG tablet  Take 1 tablet (75 mcg total) by mouth daily before breakfast. 07/26/17  Yes Carlus Pavlov, MD  metoCLOPramide (REGLAN) 5 MG tablet Take 2 tablets (10 mg total) by mouth 2 (two) times daily. 09/08/17  Yes Standley Brooking, MD  metoprolol succinate (TOPROL-XL) 25 MG 24 hr tablet Take 2 tablets (50 mg total) by mouth 2 (two) times daily. 09/08/17  Yes Standley Brooking, MD  pregabalin (LYRICA) 75 MG capsule Take 1 capsule (75 mg total) by mouth daily. 06/12/17  Yes [name], MD  mycophenolate (CELLCEPT) 250 MG capsule Take 1 capsule (250 mg total) by mouth 2 (two) times daily. 10/30/17   York Spaniel, MD    ROS:  Out of a complete 14 system review of symptoms, the patient complains only of the following symptoms, and all other reviewed systems are negative.  Loss of vision in the right eye Leg swelling Restless legs, insomnia, sleep apnea, frequent waking, snoring, sleep talking Joint pain, joint swelling, back pain, aching muscles, muscle cramps Skin wounds, itching Headache, numbness  Blood pressure 130/86, pulse 91, resp. rate 18, height 6\' 2"  (1.88 m), weight 265 lb (120.2 kg).  Physical Exam  General: The patient is alert and cooperative at the time of the examination.  The patient is markedly obese.  Skin: 2+ edema below the knees is seen bilaterally.   Neurologic Exam  Mental status: The patient is alert and oriented x 3 at the time of the examination. The patient has apparent normal recent and remote memory, with an apparently normal attention span and concentration ability.   Cranial nerves: Facial symmetry is present. Speech is normal, no aphasia or dysarthria is noted. Extraocular movements are full. Visual fields are full.  Motor: The patient has good strength in all 4 extremities, with exception of weakness of the intrinsic muscles bilaterally and bilateral foot drops are noted.  Sensory examination: Soft touch sensation is symmetric on the face, arms, and  legs.  Coordination: The patient has good finger-nose-finger and heel-to-shin bilaterally.  Gait and station: The patient has a slightly wide-based, slightly unsteady gait.  The patient uses a cane for ambulation tandem gait was not tested. Romberg is negative, but is unsteady. No drift is seen.  Reflexes: Deep tendon reflexes are symmetric, but are depressed absent throughout.   Assessment/Plan:  1.  CIDP  2.  Diabetic peripheral neuropathy  3.  Gait disorder  4.  Bilateral foot drops  The patient will  continue IVIG therapy, he will have blood work done today, we will try to restart the CellCept taking 250 mg twice daily.  He will follow-up in 6 months.  Marlan Palau MD 10/30/2017 11:08 AM  [facility] [address] [place], [place] [address]  Phone [phone] Fax [phone]

## 2017-10-30 NOTE — Patient Instructions (Signed)
We will restart Cellcept.

## 2017-10-31 ENCOUNTER — Telehealth: Payer: Self-pay | Admitting: *Deleted

## 2017-10-31 DIAGNOSIS — N186 End stage renal disease: Secondary | ICD-10-CM | POA: Diagnosis not present

## 2017-10-31 DIAGNOSIS — N2581 Secondary hyperparathyroidism of renal origin: Secondary | ICD-10-CM | POA: Diagnosis not present

## 2017-10-31 DIAGNOSIS — E1129 Type 2 diabetes mellitus with other diabetic kidney complication: Secondary | ICD-10-CM | POA: Diagnosis not present

## 2017-10-31 DIAGNOSIS — D631 Anemia in chronic kidney disease: Secondary | ICD-10-CM | POA: Diagnosis not present

## 2017-10-31 DIAGNOSIS — Z23 Encounter for immunization: Secondary | ICD-10-CM | POA: Diagnosis not present

## 2017-10-31 LAB — CBC WITH DIFFERENTIAL/PLATELET
BASOS ABS: 0.1 10*3/uL (ref 0.0–0.2)
Basos: 1 %
EOS (ABSOLUTE): 0.2 10*3/uL (ref 0.0–0.4)
EOS: 2 %
HEMATOCRIT: 33.4 % — AB (ref 37.5–51.0)
HEMOGLOBIN: 10.7 g/dL — AB (ref 13.0–17.7)
IMMATURE GRANULOCYTES: 0 %
Immature Grans (Abs): 0 10*3/uL (ref 0.0–0.1)
LYMPHS: 7 %
Lymphocytes Absolute: 0.5 10*3/uL — ABNORMAL LOW (ref 0.7–3.1)
MCH: 29 pg (ref 26.6–33.0)
MCHC: 32 g/dL (ref 31.5–35.7)
MCV: 91 fL (ref 79–97)
Monocytes Absolute: 0.7 10*3/uL (ref 0.1–0.9)
Monocytes: 10 %
NEUTROS PCT: 80 %
Neutrophils Absolute: 5.6 10*3/uL (ref 1.4–7.0)
Platelets: 231 10*3/uL (ref 150–450)
RBC: 3.69 x10E6/uL — AB (ref 4.14–5.80)
RDW: 15.1 % (ref 12.3–15.4)
WBC: 7 10*3/uL (ref 3.4–10.8)

## 2017-10-31 LAB — COMPREHENSIVE METABOLIC PANEL
ALBUMIN: 4.2 g/dL (ref 3.5–5.5)
ALK PHOS: 96 IU/L (ref 39–117)
ALT: 9 IU/L (ref 0–44)
AST: 9 IU/L (ref 0–40)
Albumin/Globulin Ratio: 1.5 (ref 1.2–2.2)
BUN/Creatinine Ratio: 5 — ABNORMAL LOW (ref 9–20)
BUN: 42 mg/dL — AB (ref 6–24)
Bilirubin Total: 0.6 mg/dL (ref 0.0–1.2)
CO2: 24 mmol/L (ref 20–29)
CREATININE: 7.69 mg/dL — AB (ref 0.76–1.27)
Calcium: 8.7 mg/dL (ref 8.7–10.2)
Chloride: 93 mmol/L — ABNORMAL LOW (ref 96–106)
GFR calc Af Amer: 9 mL/min/{1.73_m2} — ABNORMAL LOW (ref >59)
GFR calc non Af Amer: 8 mL/min/{1.73_m2} — ABNORMAL LOW (ref >59)
GLUCOSE: 337 mg/dL — AB (ref 65–99)
Globulin, Total: 2.8 g/dL (ref 1.5–4.5)
Potassium: 5 mmol/L (ref 3.5–5.2)
Sodium: 138 mmol/L (ref 134–144)
Total Protein: 7 g/dL (ref 6.0–8.5)

## 2017-10-31 NOTE — Telephone Encounter (Signed)
Spoke with Russell [name] and reviewed below lab results. He sts. blood sugar has come down since labs were drawn. I explained that since lft's have normalized, he may start Cellcept. He verbalized understanding of same/fim

## 2017-10-31 NOTE — Telephone Encounter (Signed)
-----   Message from York Spaniel, MD sent at 10/31/2017  7:13 AM EDT -----  This patient has end-stage renal disease and diabetes, chemistry profile reflects this, blood sugar is significantly elevated.  Liver profile however has normalized.  Mild anemia associated with end-stage renal disease.  Okay to start CellCept.  Please call the patient. ----- Message ----- From: Nell Range Lab Results In Sent: 10/31/2017   5:39 AM EDT To: York Spaniel, MD

## 2017-10-31 NOTE — Progress Notes (Signed)
Cardiology Office Note   Date:  11/01/2017   ID:  Russell [name], DOB April 02, 1970, MRN [id number]  PCP:  Russell Mocha, MD  Cardiologist:  Dr. Jens [name]  Chief Complaint  Patient presents with  . Coronary Artery Disease  . Congestive Heart Failure  . Hypertension     History of Present Illness: Russell [name] is a 47 y.o. male who presents for ongoing assessment and management of CAD, with Hx of CABG, chornic combined CHF, and HTN. Other history includes Type II Diabetes, and ESRD. He has had admission for hypertensive urgency in 09/2017. This was resolved with emergent hemodialysis. Echo determined EF of 45%-50%, with diffuse hypokinesis, and Grade II diastolic dysfunction. This was an improvement from prior echo in 03/2017 where EF was measured at 25%-30%.   On last office visit he did not have optimal BP control. Amlodipine was increased to 10 mg daily. He was started on Keflex for LE cellulitis, as he had open lacerations and erythema of his bilateral LE. He was scheduled for ABI's due to diminished pulses.   He is now continuing on antibiotics for cellulitis and has a dressing to the right pretibial area, with some sero-sanguinous drainage noted. No pain. He is complaining of headache pain which has worsened. He had a LEA completed just before appointment which was rescheduled. Results will not be available now.   Past Medical History:  Diagnosis Date  . Anxiety   . Asthma   . Bilateral foot-drop 10/30/2017  . 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 Daily headache   . Depression   . DVT (deep venous thrombosis), H/o 01/2014-on Xarelto 03/24/2014   LLE  . ESRD (end stage renal disease) on dialysis [facility])    "[place], MWF" (06/29/2016)  . Gait abnormality 12/22/2016  .  Gastroparesis 12/22/2016  . Hypertension   . Hypothyroidism   . LBBB (left bundle branch block) 09/06/2017  . Nephrotic syndrome 05/18/2014  . Neuropathy   . Type 2 diabetes mellitus with diabetic nephropathy [facility])     Past Surgical History:  Procedure Laterality Date  . ANKLE FRACTURE SURGERY Right 1988  . AV FISTULA PLACEMENT Left 01/01/2015   Procedure: CREATION OF LEFT RADIAL CEPHALIC ARTERIOVENOUS (AV) FISTULA ;  Surgeon: Pryor Ochoa, MD;  [facility] OR;  Service: Vascular;  Laterality: Left;  . BIOPSY  08/07/2017   Procedure: BIOPSY;  Surgeon: Sherrilyn Rist, MD;  Location: [facility]OSCOPY;  Service: Gastroenterology;;  . CAPD REMOVAL N/A 05/19/2016   Procedure: PD CATH REMOVAL;  Surgeon: Abigail Miyamoto, MD;  Location: [facility] OR;  Service: General;  Laterality: N/A;  . CARDIAC CATHETERIZATION N/A 09/22/2015   Procedure: Left Heart Cath and Coronary Angiography;  Surgeon: Iran Ouch, MD;  [facility]: [facility] INVASIVE CV LAB;  Service: Cardiovascular;  Laterality: N/A;  . CORONARY ARTERY BYPASS GRAFT N/A 09/28/2015   Procedure: CORONARY ARTERY BYPASS GRAFTING (CABG) x 3 UTILIZING LEFT MAMMARY ARTERY AND ENDOSCOPICALLY HARVESTED LEFT GREATER SAPHENOUS VEIN.;  Surgeon: Delight Ovens, MD;  Location: [facility] OR;  Service: Open Heart Surgery;  Laterality: N/A;  . ENDOVEIN HARVEST OF GREATER SAPHENOUS VEIN Left 09/28/2015   Procedure: ENDOVEIN HARVEST OF GREATER SAPHENOUS VEIN;  Surgeon: Delight Ovens, MD;  Location: [facility] OR;  Service: Open Heart Surgery;  Laterality:  Left;  . ESOPHAGOGASTRODUODENOSCOPY N/A 03/29/2014   Procedure: ESOPHAGOGASTRODUODENOSCOPY (EGD);  Surgeon: Dorena Cookey, MD;  Location: [facility];  Service: Endoscopy;  Laterality: N/A;  . ESOPHAGOGASTRODUODENOSCOPY (EGD) WITH PROPOFOL N/A 08/07/2017   Procedure: ESOPHAGOGASTRODUODENOSCOPY (EGD) WITH PROPOFOL;  Surgeon: Sherrilyn Rist, MD;  Location: [facility] ENDOSCOPY;  Service: Gastroenterology;  Laterality: N/A;  . EYE SURGERY      . FRACTURE SURGERY    . INSERTION OF DIALYSIS CATHETER Right 01/05/2015   Procedure: INSERTION OF RIGHT INTERNAL JUGULAR DIALYSIS CATHETER;  Surgeon: Fransisco Hertz, MD;  Location: [facility] OR;  Service: Vascular;  Laterality: Right;  . IR IMAGING GUIDED PORT INSERTION  06/19/2017  . LEFT HEART CATH AND CORS/GRAFTS ANGIOGRAPHY N/A 09/13/2016   Procedure: LEFT HEART CATH AND CORS/GRAFTS ANGIOGRAPHY;  Surgeon: Kathleene Hazel, MD;  Location: [facility] INVASIVE CV LAB;  Service: Cardiovascular;  Laterality: N/A;  . RETINAL LASER PROCEDURE Bilateral   . RIGHT/LEFT HEART CATH AND CORONARY/GRAFT ANGIOGRAPHY N/A 03/06/2017   Procedure: RIGHT/LEFT HEART CATH AND CORONARY/GRAFT ANGIOGRAPHY;  Surgeon: Marykay Lex, MD;  Location: [facility] INVASIVE CV LAB;  Service: Cardiovascular;  Laterality: N/A;  . TEE WITHOUT CARDIOVERSION N/A 09/28/2015   Procedure: TRANSESOPHAGEAL ECHOCARDIOGRAM (TEE);  Surgeon: Delight Ovens, MD;  Location: [facility] OR;  Service: Open Heart Surgery;  Laterality: N/A;  . TONSILLECTOMY AND ADENOIDECTOMY  1970s     Current Outpatient Medications  Medication Sig Dispense Refill  . acetaminophen (TYLENOL) 325 MG tablet Take 1-2 tablets (325-650 mg total) by mouth every 4 (four) hours as needed for mild pain.    Marland Kitchen albuterol (PROVENTIL HFA;VENTOLIN HFA) 108 (90 Base) MCG/ACT inhaler Inhale 1 puff into the lungs every 6 (six) hours as needed for wheezing or shortness of breath. (Patient taking differently: Inhale 2 puffs into the lungs every 8 (eight) hours as needed for wheezing or shortness of breath. ) 1 Inhaler 11  . amLODipine (NORVASC) 10 MG tablet Take 1 tablet (10 mg total) by mouth daily. 30 tablet 5  . aspirin EC 81 MG EC tablet Take 1 tablet (81 mg total) by mouth daily.    Marland Kitchen atorvastatin (LIPITOR) 80 MG tablet Take 1 tablet (80 mg total) by mouth daily at 6 PM. 90 tablet 3  . calcitRIOL (ROCALTROL) 0.25 MCG capsule Take 5 capsules (1.25 mcg total) by mouth every Monday, Wednesday, and Friday  with hemodialysis.    Marland Kitchen calcium acetate (PHOSLO) 667 MG capsule Take 2,001 mg by mouth 3 (three) times daily with meals.    . cyclobenzaprine (FLEXERIL) 10 MG tablet Take 10 mg by mouth 2 (two) times daily as needed for muscle spasms.    . DULoxetine (CYMBALTA) 30 MG capsule Take 1 capsule (30 mg total) by mouth daily. 90 capsule 1  . HYDROcodone-acetaminophen (NORCO/VICODIN) 5-325 MG tablet Take 1 tablet by mouth every 12 (twelve) hours as needed for severe pain. (Patient taking differently: Take 1 tablet by mouth every 6 (six) hours as needed for severe pain. ) 10 tablet 0  . insulin aspart (NOVOLOG FLEXPEN) 100 UNIT/ML FlexPen Inject 8-20 Units into the skin 3 (three) times daily with meals. (Patient taking differently: Inject 5-10 Units into the skin 3 (three) times daily with meals. Per sliding scale) 15 mL 11  . Insulin Glargine (BASAGLAR KWIKPEN) 100 UNIT/ML SOPN Inject 0.3 mLs (30 Units total) into the skin 2 (two) times daily.    . Insulin Pen Needle (PEN NEEDLES) 32G X 4 MM MISC 1 Units by Does not apply  route 6 (six) times daily. As needed and as directed by physician 540 each 4  . levothyroxine (SYNTHROID, LEVOTHROID) 75 MCG tablet Take 1 tablet (75 mcg total) by mouth daily before breakfast. 90 tablet 3  . metoCLOPramide (REGLAN) 5 MG tablet Take 2 tablets (10 mg total) by mouth 2 (two) times daily.    . metoprolol succinate (TOPROL-XL) 25 MG 24 hr tablet Take 2 tablets (50 mg total) by mouth 2 (two) times daily.    . mycophenolate (CELLCEPT) 250 MG capsule Take 1 capsule (250 mg total) by mouth 2 (two) times daily. 60 capsule 5  . pregabalin (LYRICA) 75 MG capsule Take 1 capsule (75 mg total) by mouth daily. 90 capsule 1   No current facility-administered medications for this visit.     Allergies:   Nsaids and Tape    Social History:  The patient  reports that he has never smoked. He has never used smokeless tobacco. He reports that he drinks alcohol. He reports that he does not use  drugs.   Family History:  The patient's family history includes Cancer (age of onset: 10) in his father; Heart disease in his unknown relative; Kidney cancer in his maternal grandmother; Obesity in his mother.    ROS: All other systems are reviewed and negative. Unless otherwise mentioned in H&P    PHYSICAL EXAM: VS:  Ht 6\' 2"  (1.88 m)   BMI 34.02 kg/m  , BMI Body mass index is 34.02 kg/m. GEN: Well nourished, well developed, in no acute distress HEENT: normal Neck: no JVD, carotid bruits, or masses Cardiac: RRR; no murmurs, rubs, or gallops,no edema  Respiratory:  Clear to auscultation bilaterally, normal work of breathing GI: soft, nontender, nondistended, + BS MS: no deformity or atrophy. Open laceration with eschar noted, sero-sanguinous drainage is noted. I removed the dressing. No significant erythema, no pain, no odor.   Skin: warm and dry, no rash Neuro:  Strength and sensation are intact Psych: euthymic mood, full affect   EKG:  Not completed this office visit.   Recent Labs: 03/22/2017: Magnesium 2.0 07/26/2017: TSH 5.03 10/30/2017: ALT 9; BUN 42; Creatinine, Ser 7.69; Hemoglobin 10.7; Platelets 231; Potassium 5.0; Sodium 138    Lipid Panel    Component Value Date/Time   CHOL 134 03/21/2017 0745   CHOL 228 (H) 04/17/2016 0900   TRIG 113 03/21/2017 0745   HDL 40 (L) 03/21/2017 0745   HDL 33 (L) 04/17/2016 0900   CHOLHDL 3.4 03/21/2017 0745   VLDL 23 03/21/2017 0745   LDLCALC 71 03/21/2017 0745   LDLCALC 126 (H) 04/17/2016 0900      Wt Readings from Last 3 Encounters:  10/30/17 265 lb (120.2 kg)  09/25/17 270 lb 6.4 oz (122.7 kg)  09/07/17 251 lb 12.3 oz (114.2 kg)      Other studies Reviewed: Echocardiogram Left ventricle: The cavity size was normal. Systolic function was   mildly reduced. The estimated ejection fraction was in the range   of 45% to 50%. Diffuse hypokinesis. Features are consistent with   a pseudonormal left ventricular filling  pattern, with concomitant   abnormal relaxation and increased filling pressure (grade 2   diastolic dysfunction). Doppler parameters are consistent with   elevated ventricular end-diastolic filling pressure. - Aortic valve: Trileaflet; mildly thickened, mildly calcified   leaflets. - Left atrium: The atrium was moderately dilated. - Right ventricle: Systolic function was normal. - Tricuspid valve: There was no regurgitation.  Impressions:  - When compared to the  prior study on 03/04/2017 LVEF has improved   from 25-30% to 50-55%. ASSESSMENT AND PLAN:  1. Diastolic CHF: He is euvolemic today. No complaints of DOE, or LEE. He will continue current regimen. Not on diuretics at this time  2. LE Cellulitis: Continues on Augmentin and dressing changes. He will see his PCP for ongoing evaluation and need for referral to wound clinic.   3. Diminished pulses in the LE: R/O PAD>LEA completed today.   4. Chronic Headaches: May need to see PCP for evaluation of cervical spine.   Current medicines are reviewed at length with the patient today.    Labs/ tests ordered today include: none  Bettey Mare. Liborio Nixon, ANP, AACC   11/01/2017 9:26 AM    [facility] [address] Office [phone] Fax [phone]

## 2017-11-01 ENCOUNTER — Ambulatory Visit (INDEPENDENT_AMBULATORY_CARE_PROVIDER_SITE_OTHER): Payer: Medicare Other | Admitting: Adult Health

## 2017-11-01 ENCOUNTER — Encounter: Payer: Self-pay | Admitting: Adult Health

## 2017-11-01 ENCOUNTER — Ambulatory Visit (HOSPITAL_COMMUNITY)
Admission: RE | Admit: 2017-11-01 | Discharge: 2017-11-01 | Disposition: A | Discharge status: Home / Self Care | Payer: Medicare Other | Source: Ambulatory Visit | Attending: Cardiology | Admitting: Cardiology

## 2017-11-01 VITALS — BP 110/70 | HR 77 | Ht 74.0 in | Wt 267.0 lb

## 2017-11-01 DIAGNOSIS — R0989 Other specified symptoms and signs involving the circulatory and respiratory systems: Secondary | ICD-10-CM | POA: Diagnosis not present

## 2017-11-01 DIAGNOSIS — L03119 Cellulitis of unspecified part of limb: Secondary | ICD-10-CM

## 2017-11-01 DIAGNOSIS — M79609 Pain in unspecified limb: Secondary | ICD-10-CM | POA: Insufficient documentation

## 2017-11-01 DIAGNOSIS — I2510 Atherosclerotic heart disease of native coronary artery without angina pectoris: Secondary | ICD-10-CM | POA: Diagnosis not present

## 2017-11-01 DIAGNOSIS — I5032 Chronic diastolic (congestive) heart failure: Secondary | ICD-10-CM | POA: Diagnosis not present

## 2017-11-01 DIAGNOSIS — I10 Essential (primary) hypertension: Secondary | ICD-10-CM | POA: Diagnosis not present

## 2017-11-01 MED ORDER — METOPROLOL TARTRATE 50 MG PO TABS: 50.0000 mg | ORAL_TABLET | Freq: Two times a day (BID) | ORAL | 3 refills | Status: AC

## 2017-11-01 NOTE — Patient Instructions (Signed)
Follow-Up: You will need a follow up appointment in 1 MONTH.  You may see  DR [name], DNP, ANP -or- one of the following Advanced Practice Providers on your designated Care Team:   . Corine Shelter, [place]  Medication Instructions:  NO CHANGES- Your physician recommends that you continue on your current medications as directed. Please refer to the Current Medication list given to you today.  If you need a refill on your cardiac medications before your next appointment, please call your pharmacy.  Labwork: If you have labs (blood work) drawn today and your tests are completely normal, you will receive your results ONLY by: . MyChart Message (if you have MyChart) -OR- . A paper copy in the mail  At [facility], you and your health needs are our priority.  As part of our continuing mission to provide you with exceptional heart care, we have created designated Provider Care Teams.  These Care Teams include your primary Cardiologist (physician) and Advanced Practice Providers (APPs -  Physician Assistants and Nurse Practitioners) who all work together to provide you with the care you need, when you need it.  Thank you for choosing [facility] at [facility]!!

## 2017-11-02 DIAGNOSIS — D631 Anemia in chronic kidney disease: Secondary | ICD-10-CM | POA: Diagnosis not present

## 2017-11-02 DIAGNOSIS — E1129 Type 2 diabetes mellitus with other diabetic kidney complication: Secondary | ICD-10-CM | POA: Diagnosis not present

## 2017-11-02 DIAGNOSIS — N186 End stage renal disease: Secondary | ICD-10-CM | POA: Diagnosis not present

## 2017-11-02 DIAGNOSIS — Z992 Dependence on renal dialysis: Secondary | ICD-10-CM | POA: Diagnosis not present

## 2017-11-02 DIAGNOSIS — N2581 Secondary hyperparathyroidism of renal origin: Secondary | ICD-10-CM | POA: Diagnosis not present

## 2017-11-02 DIAGNOSIS — N049 Nephrotic syndrome with unspecified morphologic changes: Secondary | ICD-10-CM | POA: Diagnosis not present

## 2017-11-05 ENCOUNTER — Ambulatory Visit: Payer: Self-pay

## 2017-11-05 DIAGNOSIS — N186 End stage renal disease: Secondary | ICD-10-CM | POA: Diagnosis not present

## 2017-11-05 DIAGNOSIS — E1129 Type 2 diabetes mellitus with other diabetic kidney complication: Secondary | ICD-10-CM | POA: Diagnosis not present

## 2017-11-05 DIAGNOSIS — D631 Anemia in chronic kidney disease: Secondary | ICD-10-CM | POA: Diagnosis not present

## 2017-11-05 DIAGNOSIS — N2581 Secondary hyperparathyroidism of renal origin: Secondary | ICD-10-CM | POA: Diagnosis not present

## 2017-11-05 NOTE — Progress Notes (Signed)
Notes recorded by Jodelle Gross, NP on 11/04/2017 at 4:38 PM EST Blood flow to both lower extremities was normal Good report.!!

## 2017-11-05 NOTE — Telephone Encounter (Signed)
Incoming call from Patient, stating he has  an open wound on the right   "shin bone".  It occurred 2 weeks ago. He hit it on the back of a tow truck.    Patient states he is not able to see the would .  He states that he is stiff.  He can't see it.  States his Mother  is dressing the wound BID  Reports that the wound is weeping clear greenish fluid pus.  States it is about an inch long.  States he is current with his tetanus immunization.  Provided care advice   Voiced understanding.   Scheduled appointment for 11/07/17 @ 11:30. Related to Patient to arrive @ 11:15 am for check in.  Patient voiced understanding.          Reason for Disposition . [1] After 14 days AND [2] wound isn't healed  Answer Assessment - Initial Assessment Questions 1. APPEARANCE of INJURY: "What does the injury look like?"      *No Answer* 2. SIZE: "How large is the cut?"     About or inch or less 3. BLEEDING: "Is it bleeding now?" If so, ask: "Is it difficult to stop?"      no 4. LOCATION: "Where is the injury located?"      Right leg 5. ONSET: "How long ago did the injury occur?"      2 weeks ago 6. MECHANISM: "Tell me how it happened."      *No Answer* 7. TETANUS: "When was the last tetanus booster?"     *No Answer* 8. PREGNANCY: "Is there any chance you are pregnant?" "When was your last menstrual period?"     *No Answer*  Protocols used: CUTS AND LACERATIONS-A-AH

## 2017-11-07 ENCOUNTER — Emergency Department (HOSPITAL_COMMUNITY): Payer: Medicare Other

## 2017-11-07 ENCOUNTER — Ambulatory Visit (INDEPENDENT_AMBULATORY_CARE_PROVIDER_SITE_OTHER): Payer: Medicare Other | Admitting: Family Medicine

## 2017-11-07 ENCOUNTER — Encounter (HOSPITAL_COMMUNITY): Payer: Self-pay

## 2017-11-07 ENCOUNTER — Encounter: Payer: Self-pay | Admitting: Family Medicine

## 2017-11-07 ENCOUNTER — Telehealth: Payer: Self-pay

## 2017-11-07 ENCOUNTER — Inpatient Hospital Stay (HOSPITAL_COMMUNITY)
Admission: EM | Admit: 2017-11-07 | Discharge: 2017-11-11 | DRG: 602 | Disposition: A | Discharge status: Home Health | Payer: Medicare Other | Attending: Internal Medicine | Admitting: Internal Medicine

## 2017-11-07 ENCOUNTER — Other Ambulatory Visit: Payer: Self-pay

## 2017-11-07 VITALS — BP 128/64 | HR 86 | Temp 98.5°F | Resp 16 | Ht 74.0 in | Wt 262.0 lb

## 2017-11-07 DIAGNOSIS — E1151 Type 2 diabetes mellitus with diabetic peripheral angiopathy without gangrene: Secondary | ICD-10-CM | POA: Diagnosis present

## 2017-11-07 DIAGNOSIS — S81801A Unspecified open wound, right lower leg, initial encounter: Secondary | ICD-10-CM

## 2017-11-07 DIAGNOSIS — Z806 Family history of leukemia: Secondary | ICD-10-CM

## 2017-11-07 DIAGNOSIS — I2510 Atherosclerotic heart disease of native coronary artery without angina pectoris: Secondary | ICD-10-CM

## 2017-11-07 DIAGNOSIS — L0390 Cellulitis, unspecified: Secondary | ICD-10-CM | POA: Diagnosis present

## 2017-11-07 DIAGNOSIS — L089 Local infection of the skin and subcutaneous tissue, unspecified: Secondary | ICD-10-CM | POA: Diagnosis not present

## 2017-11-07 DIAGNOSIS — M21372 Foot drop, left foot: Secondary | ICD-10-CM | POA: Diagnosis present

## 2017-11-07 DIAGNOSIS — G6181 Chronic inflammatory demyelinating polyneuritis: Secondary | ICD-10-CM | POA: Diagnosis present

## 2017-11-07 DIAGNOSIS — E1165 Type 2 diabetes mellitus with hyperglycemia: Secondary | ICD-10-CM

## 2017-11-07 DIAGNOSIS — I120 Hypertensive chronic kidney disease with stage 5 chronic kidney disease or end stage renal disease: Secondary | ICD-10-CM | POA: Diagnosis not present

## 2017-11-07 DIAGNOSIS — Z91048 Other nonmedicinal substance allergy status: Secondary | ICD-10-CM

## 2017-11-07 DIAGNOSIS — E039 Hypothyroidism, unspecified: Secondary | ICD-10-CM | POA: Diagnosis present

## 2017-11-07 DIAGNOSIS — I5022 Chronic systolic (congestive) heart failure: Secondary | ICD-10-CM | POA: Diagnosis present

## 2017-11-07 DIAGNOSIS — F329 Major depressive disorder, single episode, unspecified: Secondary | ICD-10-CM | POA: Diagnosis present

## 2017-11-07 DIAGNOSIS — R7989 Other specified abnormal findings of blood chemistry: Secondary | ICD-10-CM | POA: Diagnosis present

## 2017-11-07 DIAGNOSIS — I252 Old myocardial infarction: Secondary | ICD-10-CM

## 2017-11-07 DIAGNOSIS — I10 Essential (primary) hypertension: Secondary | ICD-10-CM | POA: Diagnosis present

## 2017-11-07 DIAGNOSIS — F419 Anxiety disorder, unspecified: Secondary | ICD-10-CM | POA: Diagnosis present

## 2017-11-07 DIAGNOSIS — L03115 Cellulitis of right lower limb: Secondary | ICD-10-CM | POA: Diagnosis present

## 2017-11-07 DIAGNOSIS — R600 Localized edema: Secondary | ICD-10-CM | POA: Diagnosis not present

## 2017-11-07 DIAGNOSIS — E1122 Type 2 diabetes mellitus with diabetic chronic kidney disease: Secondary | ICD-10-CM | POA: Diagnosis present

## 2017-11-07 DIAGNOSIS — R778 Other specified abnormalities of plasma proteins: Secondary | ICD-10-CM | POA: Diagnosis not present

## 2017-11-07 DIAGNOSIS — M21371 Foot drop, right foot: Secondary | ICD-10-CM | POA: Diagnosis present

## 2017-11-07 DIAGNOSIS — I447 Left bundle-branch block, unspecified: Secondary | ICD-10-CM | POA: Diagnosis present

## 2017-11-07 DIAGNOSIS — Z9089 Acquired absence of other organs: Secondary | ICD-10-CM

## 2017-11-07 DIAGNOSIS — T148XXA Other injury of unspecified body region, initial encounter: Secondary | ICD-10-CM

## 2017-11-07 DIAGNOSIS — Z79899 Other long term (current) drug therapy: Secondary | ICD-10-CM

## 2017-11-07 DIAGNOSIS — E1143 Type 2 diabetes mellitus with diabetic autonomic (poly)neuropathy: Secondary | ICD-10-CM | POA: Diagnosis not present

## 2017-11-07 DIAGNOSIS — L538 Other specified erythematous conditions: Secondary | ICD-10-CM | POA: Diagnosis not present

## 2017-11-07 DIAGNOSIS — R112 Nausea with vomiting, unspecified: Secondary | ICD-10-CM

## 2017-11-07 DIAGNOSIS — K3184 Gastroparesis: Secondary | ICD-10-CM | POA: Diagnosis not present

## 2017-11-07 DIAGNOSIS — Z951 Presence of aortocoronary bypass graft: Secondary | ICD-10-CM

## 2017-11-07 DIAGNOSIS — Z992 Dependence on renal dialysis: Secondary | ICD-10-CM | POA: Diagnosis not present

## 2017-11-07 DIAGNOSIS — Z886 Allergy status to analgesic agent status: Secondary | ICD-10-CM

## 2017-11-07 DIAGNOSIS — Z6833 Body mass index (BMI) 33.0-33.9, adult: Secondary | ICD-10-CM

## 2017-11-07 DIAGNOSIS — R0902 Hypoxemia: Secondary | ICD-10-CM | POA: Diagnosis present

## 2017-11-07 DIAGNOSIS — J45909 Unspecified asthma, uncomplicated: Secondary | ICD-10-CM | POA: Diagnosis not present

## 2017-11-07 DIAGNOSIS — E11649 Type 2 diabetes mellitus with hypoglycemia without coma: Secondary | ICD-10-CM | POA: Diagnosis not present

## 2017-11-07 DIAGNOSIS — D631 Anemia in chronic kidney disease: Secondary | ICD-10-CM | POA: Diagnosis present

## 2017-11-07 DIAGNOSIS — G8250 Quadriplegia, unspecified: Secondary | ICD-10-CM | POA: Diagnosis present

## 2017-11-07 DIAGNOSIS — Z8051 Family history of malignant neoplasm of kidney: Secondary | ICD-10-CM

## 2017-11-07 DIAGNOSIS — N2581 Secondary hyperparathyroidism of renal origin: Secondary | ICD-10-CM | POA: Diagnosis present

## 2017-11-07 DIAGNOSIS — E1129 Type 2 diabetes mellitus with other diabetic kidney complication: Secondary | ICD-10-CM | POA: Diagnosis not present

## 2017-11-07 DIAGNOSIS — N186 End stage renal disease: Secondary | ICD-10-CM | POA: Diagnosis not present

## 2017-11-07 DIAGNOSIS — T8140XA Infection following a procedure, unspecified, initial encounter: Secondary | ICD-10-CM | POA: Diagnosis not present

## 2017-11-07 DIAGNOSIS — I491 Atrial premature depolarization: Secondary | ICD-10-CM | POA: Diagnosis present

## 2017-11-07 DIAGNOSIS — IMO0002 Reserved for concepts with insufficient information to code with codable children: Secondary | ICD-10-CM | POA: Diagnosis present

## 2017-11-07 DIAGNOSIS — Z7901 Long term (current) use of anticoagulants: Secondary | ICD-10-CM

## 2017-11-07 DIAGNOSIS — Z86718 Personal history of other venous thrombosis and embolism: Secondary | ICD-10-CM

## 2017-11-07 DIAGNOSIS — Z794 Long term (current) use of insulin: Secondary | ICD-10-CM

## 2017-11-07 DIAGNOSIS — Z8349 Family history of other endocrine, nutritional and metabolic diseases: Secondary | ICD-10-CM

## 2017-11-07 DIAGNOSIS — E669 Obesity, unspecified: Secondary | ICD-10-CM | POA: Diagnosis present

## 2017-11-07 DIAGNOSIS — Z79890 Hormone replacement therapy (postmenopausal): Secondary | ICD-10-CM

## 2017-11-07 DIAGNOSIS — R000 Tachycardia, unspecified: Secondary | ICD-10-CM | POA: Diagnosis present

## 2017-11-07 DIAGNOSIS — I132 Hypertensive heart and chronic kidney disease with heart failure and with stage 5 chronic kidney disease, or end stage renal disease: Secondary | ICD-10-CM | POA: Diagnosis present

## 2017-11-07 DIAGNOSIS — Z7982 Long term (current) use of aspirin: Secondary | ICD-10-CM

## 2017-11-07 LAB — CBC WITH DIFFERENTIAL/PLATELET
ABS IMMATURE GRANULOCYTES: 0.01 10*3/uL (ref 0.00–0.07)
BASOS ABS: 0 10*3/uL (ref 0.0–0.1)
Basophils Relative: 1 %
Eosinophils Absolute: 0.1 10*3/uL (ref 0.0–0.5)
Eosinophils Relative: 2 %
HEMATOCRIT: 37.6 % — AB (ref 39.0–52.0)
HEMOGLOBIN: 11.9 g/dL — AB (ref 13.0–17.0)
IMMATURE GRANULOCYTES: 0 %
LYMPHS ABS: 0.4 10*3/uL — AB (ref 0.7–4.0)
LYMPHS PCT: 7 %
MCH: 29.5 pg (ref 26.0–34.0)
MCHC: 31.6 g/dL (ref 30.0–36.0)
MCV: 93.1 fL (ref 80.0–100.0)
Monocytes Absolute: 0.7 10*3/uL (ref 0.1–1.0)
Monocytes Relative: 11 %
NEUTROS ABS: 4.7 10*3/uL (ref 1.7–7.7)
NEUTROS PCT: 79 %
NRBC: 0 % (ref 0.0–0.2)
Platelets: 176 10*3/uL (ref 150–400)
RBC: 4.04 MIL/uL — ABNORMAL LOW (ref 4.22–5.81)
RDW: 15.7 % — AB (ref 11.5–15.5)
WBC: 6 10*3/uL (ref 4.0–10.5)

## 2017-11-07 LAB — COMPREHENSIVE METABOLIC PANEL
ALBUMIN: 4 g/dL (ref 3.5–5.0)
ALT: 15 U/L (ref 0–44)
AST: 18 U/L (ref 15–41)
Alkaline Phosphatase: 77 U/L (ref 38–126)
Anion gap: 15 (ref 5–15)
BILIRUBIN TOTAL: 1.3 mg/dL — AB (ref 0.3–1.2)
BUN: 28 mg/dL — AB (ref 6–20)
CO2: 28 mmol/L (ref 22–32)
Calcium: 8.9 mg/dL (ref 8.9–10.3)
Chloride: 93 mmol/L — ABNORMAL LOW (ref 98–111)
Creatinine, Ser: 5.57 mg/dL — ABNORMAL HIGH (ref 0.61–1.24)
GFR calc Af Amer: 13 mL/min — ABNORMAL LOW (ref >60)
GFR calc non Af Amer: 11 mL/min — ABNORMAL LOW (ref >60)
GLUCOSE: 268 mg/dL — AB (ref 70–99)
POTASSIUM: 3.8 mmol/L (ref 3.5–5.1)
Sodium: 136 mmol/L (ref 135–145)
TOTAL PROTEIN: 7.7 g/dL (ref 6.5–8.1)

## 2017-11-07 LAB — I-STAT CG4 LACTIC ACID, ED
Lactic Acid, Venous: 1.83 mmol/L (ref 0.5–1.9)
Lactic Acid, Venous: 1.67 mmol/L (ref 0.5–1.9)

## 2017-11-07 LAB — BRAIN NATRIURETIC PEPTIDE: B Natriuretic Peptide: 2339 pg/mL — ABNORMAL HIGH (ref 0.0–100.0)

## 2017-11-07 LAB — I-STAT TROPONIN, ED: Troponin i, poc: 0.1 ng/mL (ref 0.00–0.08)

## 2017-11-07 MED ORDER — METOPROLOL TARTRATE 25 MG PO TABS
50.0000 mg | ORAL_TABLET | Freq: Once | ORAL | Status: AC
  Administered 2017-11-07: 50 mg via ORAL
  Filled 2017-11-07: qty 2

## 2017-11-07 MED ORDER — ACETAMINOPHEN 650 MG RE SUPP: 650.0000 mg | Freq: Four times a day (QID) | RECTAL | Status: DC | PRN

## 2017-11-07 MED ORDER — CYCLOBENZAPRINE HCL 10 MG PO TABS: 10.0000 mg | ORAL_TABLET | Freq: Two times a day (BID) | ORAL | Status: DC | PRN

## 2017-11-07 MED ORDER — METOPROLOL TARTRATE 50 MG PO TABS
50.0000 mg | ORAL_TABLET | Freq: Two times a day (BID) | ORAL | Status: DC
  Administered 2017-11-08 – 2017-11-11 (×6): 50 mg via ORAL
  Filled 2017-11-07 (×8): qty 1

## 2017-11-07 MED ORDER — ONDANSETRON 4 MG PO TBDP
4.0000 mg | ORAL_TABLET | Freq: Once | ORAL | Status: AC
  Administered 2017-11-07: 4 mg via ORAL
  Filled 2017-11-07: qty 1

## 2017-11-07 MED ORDER — HYDROMORPHONE HCL 1 MG/ML IJ SOLN
0.5000 mg | Freq: Once | INTRAMUSCULAR | Status: AC
  Administered 2017-11-07: 0.5 mg via INTRAVENOUS
  Filled 2017-11-07: qty 1

## 2017-11-07 MED ORDER — IOPAMIDOL (ISOVUE-300) INJECTION 61%
INTRAVENOUS | Status: AC
  Filled 2017-11-07: qty 100

## 2017-11-07 MED ORDER — IPRATROPIUM-ALBUTEROL 0.5-2.5 (3) MG/3ML IN SOLN
3.0000 mL | Freq: Once | RESPIRATORY_TRACT | Status: AC
  Administered 2017-11-07: 3 mL via RESPIRATORY_TRACT
  Filled 2017-11-07: qty 3

## 2017-11-07 MED ORDER — AMLODIPINE BESYLATE 5 MG PO TABS
10.0000 mg | ORAL_TABLET | Freq: Once | ORAL | Status: AC
  Administered 2017-11-07: 10 mg via ORAL
  Filled 2017-11-07: qty 2

## 2017-11-07 MED ORDER — SODIUM CHLORIDE 0.9 % IV SOLN
2.0000 g | INTRAVENOUS | Status: DC
  Administered 2017-11-07 – 2017-11-10 (×3): 2 g via INTRAVENOUS
  Filled 2017-11-07 (×5): qty 20

## 2017-11-07 MED ORDER — INSULIN ASPART 100 UNIT/ML ~~LOC~~ SOLN
0.0000 [IU] | Freq: Three times a day (TID) | SUBCUTANEOUS | Status: DC
  Administered 2017-11-08: 7 [IU] via SUBCUTANEOUS
  Administered 2017-11-08 (×2): 5 [IU] via SUBCUTANEOUS

## 2017-11-07 MED ORDER — ALBUTEROL SULFATE (2.5 MG/3ML) 0.083% IN NEBU: 2.5000 mg | INHALATION_SOLUTION | Freq: Three times a day (TID) | RESPIRATORY_TRACT | Status: DC | PRN

## 2017-11-07 MED ORDER — ACETAMINOPHEN 325 MG PO TABS: 650.0000 mg | ORAL_TABLET | Freq: Four times a day (QID) | ORAL | Status: DC | PRN

## 2017-11-07 MED ORDER — INSULIN GLARGINE 100 UNIT/ML ~~LOC~~ SOLN
30.0000 [IU] | Freq: Two times a day (BID) | SUBCUTANEOUS | Status: DC
  Administered 2017-11-08 (×3): 30 [IU] via SUBCUTANEOUS
  Filled 2017-11-07 (×5): qty 0.3

## 2017-11-07 MED ORDER — ASPIRIN EC 81 MG PO TBEC
81.0000 mg | DELAYED_RELEASE_TABLET | Freq: Every day | ORAL | Status: DC
  Administered 2017-11-08 – 2017-11-11 (×3): 81 mg via ORAL
  Filled 2017-11-07 (×4): qty 1

## 2017-11-07 MED ORDER — LEVOTHYROXINE SODIUM 75 MCG PO TABS
75.0000 ug | ORAL_TABLET | Freq: Every day | ORAL | Status: DC
  Administered 2017-11-08 – 2017-11-11 (×2): 75 ug via ORAL
  Filled 2017-11-07 (×4): qty 1

## 2017-11-07 MED ORDER — SODIUM CHLORIDE (PF) 0.9 % IJ SOLN
INTRAMUSCULAR | Status: AC
  Filled 2017-11-07: qty 50

## 2017-11-07 MED ORDER — AMLODIPINE BESYLATE 10 MG PO TABS
10.0000 mg | ORAL_TABLET | Freq: Every day | ORAL | Status: DC
  Administered 2017-11-08 – 2017-11-11 (×3): 10 mg via ORAL
  Filled 2017-11-07 (×4): qty 1

## 2017-11-07 MED ORDER — HYDROCODONE-ACETAMINOPHEN 5-325 MG PO TABS: 1.0000 | ORAL_TABLET | Freq: Four times a day (QID) | ORAL | Status: DC | PRN

## 2017-11-07 MED ORDER — DULOXETINE HCL 30 MG PO CPEP
30.0000 mg | ORAL_CAPSULE | Freq: Every day | ORAL | Status: DC
  Administered 2017-11-08 – 2017-11-11 (×3): 30 mg via ORAL
  Filled 2017-11-07 (×4): qty 1

## 2017-11-07 MED ORDER — CALCITRIOL 0.25 MCG PO CAPS
1.2500 ug | ORAL_CAPSULE | ORAL | Status: DC
  Administered 2017-11-09: 1.25 ug via ORAL

## 2017-11-07 MED ORDER — MYCOPHENOLATE MOFETIL 250 MG PO CAPS
250.0000 mg | ORAL_CAPSULE | Freq: Two times a day (BID) | ORAL | Status: DC
  Administered 2017-11-08 – 2017-11-11 (×5): 250 mg via ORAL
  Filled 2017-11-07 (×8): qty 1

## 2017-11-07 MED ORDER — CALCIUM ACETATE (PHOS BINDER) 667 MG PO CAPS
2001.0000 mg | ORAL_CAPSULE | Freq: Three times a day (TID) | ORAL | Status: DC
  Administered 2017-11-08 – 2017-11-11 (×3): 2001 mg via ORAL
  Filled 2017-11-07 (×5): qty 3

## 2017-11-07 MED ORDER — METOPROLOL TARTRATE 25 MG PO TABS
25.0000 mg | ORAL_TABLET | Freq: Once | ORAL | Status: AC
  Administered 2017-11-07: 25 mg via ORAL
  Filled 2017-11-07: qty 1

## 2017-11-07 MED ORDER — PREGABALIN 75 MG PO CAPS
75.0000 mg | ORAL_CAPSULE | Freq: Every day | ORAL | Status: DC
  Administered 2017-11-08 – 2017-11-11 (×3): 75 mg via ORAL
  Filled 2017-11-07 (×3): qty 1

## 2017-11-07 MED ORDER — METOCLOPRAMIDE HCL 10 MG PO TABS
10.0000 mg | ORAL_TABLET | Freq: Two times a day (BID) | ORAL | Status: DC
  Filled 2017-11-07: qty 1

## 2017-11-07 MED ORDER — VANCOMYCIN HCL 10 G IV SOLR
2500.0000 mg | Freq: Once | INTRAVENOUS | Status: AC
  Administered 2017-11-07: 2500 mg via INTRAVENOUS
  Filled 2017-11-07: qty 2000

## 2017-11-07 MED ORDER — VANCOMYCIN HCL 10 G IV SOLR
2000.0000 mg | Freq: Once | INTRAVENOUS | Status: DC
  Filled 2017-11-07: qty 2000

## 2017-11-07 MED ORDER — IOPAMIDOL (ISOVUE-300) INJECTION 61%
100.0000 mL | Freq: Once | INTRAVENOUS | Status: AC | PRN
  Administered 2017-11-07: 100 mL via INTRAVENOUS

## 2017-11-07 MED ORDER — FENTANYL CITRATE (PF) 100 MCG/2ML IJ SOLN
25.0000 ug | Freq: Once | INTRAMUSCULAR | Status: AC
  Administered 2017-11-07: 25 ug via INTRAVENOUS
  Filled 2017-11-07: qty 2

## 2017-11-07 MED ORDER — ATORVASTATIN CALCIUM 80 MG PO TABS
80.0000 mg | ORAL_TABLET | Freq: Every day | ORAL | Status: DC
  Administered 2017-11-10: 80 mg via ORAL
  Filled 2017-11-07: qty 1

## 2017-11-07 NOTE — Progress Notes (Addendum)
A consult was received from an ED physician for vancomycin per pharmacy dosing.  The patient's profile has been reviewed for ht/wt/allergies/indication/available labs.    A one time order has been placed for vancomycin 2500 mg IV x 1.  Also EDP has ordered Rocephin 2 gm IV x 1. Pt is ESRD on HD.   Further antibiotics/pharmacy consults should be ordered by admitting physician if indicated.                       Thank you, Herby Abraham, Pharm.D [phone] 11/07/2017 6:16 PM

## 2017-11-07 NOTE — ED Notes (Signed)
[facility] here to transfer pt to [facility]. 

## 2017-11-07 NOTE — ED Notes (Signed)
ED Provider at bedside. 

## 2017-11-07 NOTE — ED Notes (Signed)
Pt refused rectal tempeture.  

## 2017-11-07 NOTE — ED Notes (Signed)
ED TO INPATIENT HANDOFF REPORT  Name/Age/Gender [name] 47 y.o. male  Code Status Code Status History    Date Active Date Inactive Code Status Order ID Comments User Context   09/05/2017 1453 09/08/2017 1816 Full Code [id number]  Karmen Bongo, MD ED   05/25/2017 1639 06/01/2017 1742 Full Code [id number]  Bary Leriche, PA-C Inpatient   05/25/2017 1639 05/25/2017 1639 Full Code [id number]  Bary Leriche, PA-C Inpatient   05/20/2017 1644 05/25/2017 1618 Full Code [id number]  Velna Ochs, MD ED   03/20/2017 0235 03/22/2017 1840 Full Code [id number]  Bethena Roys, MD ED   03/01/2017 1940 03/13/2017 2005 Full Code [id number]  Elwin Mocha, MD ED   10/14/2016 0307 10/15/2016 1819 Full Code [id number]  Toy Baker, MD ED   09/26/2016 0836 09/28/2016 1727 Full Code [id number]  Danford, Suann Larry, MD Inpatient   09/13/2016 1705 09/14/2016 0009 Full Code [id number]  Burnell Blanks, MD Inpatient   06/29/2016 1130 06/30/2016 2240 Full Code [id number]  Waldemar Dickens, MD ED   05/23/2016 2008 05/25/2016 2341 Full Code [id number]  Vianne Bulls, MD ED   05/16/2016 1946 05/20/2016 1609 Full Code [id number]  Ivor Costa, MD ED   04/27/2016 2242 04/28/2016 1624 Full Code [id number]  Edwin Dada, MD Inpatient   03/09/2016 1701 03/11/2016 0014 Full Code [id number]  Juliet Rude, MD ED   09/20/2015 1739 09/28/2015 1333 Full Code [id number]  Archie Patten, MD Inpatient   07/20/2015 0153 07/21/2015 2025 Full Code [id number]  Harrie Foreman, MD ED   02/13/2015 2255 02/16/2015 1706 Full Code [id number]  Etta Quill, DO ED   12/30/2014 1429 01/08/2015 2141 Full Code [id number]  Janece Canterbury, MD Inpatient   11/17/2014 2255 11/21/2014 1343 Full Code [id number]  Rosemarie Ax, MD Inpatient   09/30/2014 1922 10/03/2014 1814 Full Code [id number]  Willia Craze, NP Inpatient   09/21/2014 1709 09/29/2014 2133 Full Code [id number]  Theodis Blaze, MD Inpatient   04/05/2014 1717 04/06/2014  2017 Full Code [id number]  Elgergawy, Mitschke Huguenin, MD Inpatient   03/24/2014 1602 03/29/2014 1533 Full Code [id number]  Nita Sells, MD Inpatient      Home/SNF/Other Home  Chief Complaint wound on right leg   Level of Care/Admitting Diagnosis ED Disposition    ED Disposition Condition [place]: [facility] [100100]  Level of Care: Telemetry [5]  Diagnosis: Cellulitis [916945]  Admitting Physician: Rise Patience [phone]  Attending Physician: Rise Patience [phone]  Estimated length of stay: past midnight tomorrow  Certification:: I certify this patient will need inpatient services for at least 2 midnights  PT Class (Do Not Modify): Inpatient [101]  PT Acc Code (Do Not Modify): Private [1]       Medical History Past Medical History:  Diagnosis Date  . Anxiety   . Asthma   . Bilateral foot-drop 10/30/2017  . 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 Daily headache   . Depression   . DVT (deep venous thrombosis), H/o 01/2014-on Xarelto 03/24/2014   LLE  . ESRD (end stage renal disease) on dialysis [facility])    "[place], MWF" (06/29/2016)  . Gait abnormality 12/22/2016  . Gastroparesis 12/22/2016  . Hypertension   . Hypothyroidism   .  LBBB (left bundle branch block) 09/06/2017  . Nephrotic syndrome 05/18/2014  . Neuropathy   . Type 2 diabetes mellitus with diabetic nephropathy (HCC)     Allergies Allergies  Allergen Reactions  . Nsaids Other (See Comments)    Told to avoid all nsaids due to kidney disease   . Tape Other (See Comments)    Welts result, if left for a long amount of time    IV Location/Drains/Wounds Patient Lines/Drains/Airways Status   Active Line/Drains/Airways    Name:   Placement date:    Placement time:   Site:   Days:   Implanted Port 06/19/17 Right Chest   06/19/17    1053    Chest   141   Fistula / Graft Left Forearm Arteriovenous fistula   01/01/15    -    Forearm   1041   Incision (Closed) 06/19/17 Chest Right   06/19/17    1120     141   Wound / Incision (Open or Dehisced) 03/01/17 Diabetic ulcer Ankle Anterior;Left   03/01/17    2050    Ankle   251          Labs/Imaging Results for orders placed or performed during the hospital encounter of 11/07/17 (from the past 48 hour(s))  CBC with Differential     Status: Abnormal   Collection Time: 11/07/17  6:43 PM  Result Value Ref Range   WBC 6.0 4.0 - 10.5 K/uL   RBC 4.04 (L) 4.22 - 5.81 MIL/uL   Hemoglobin 11.9 (L) 13.0 - 17.0 g/dL   HCT 37.6 (L) 39.0 - 52.0 %   MCV 93.1 80.0 - 100.0 fL   MCH 29.5 26.0 - 34.0 pg   MCHC 31.6 30.0 - 36.0 g/dL   RDW 15.7 (H) 11.5 - 15.5 %   Platelets 176 150 - 400 K/uL   nRBC 0.0 0.0 - 0.2 %   Neutrophils Relative % 79 %   Neutro Abs 4.7 1.7 - 7.7 K/uL   Lymphocytes Relative 7 %   Lymphs Abs 0.4 (L) 0.7 - 4.0 K/uL   Monocytes Relative 11 %   Monocytes Absolute 0.7 0.1 - 1.0 K/uL   Eosinophils Relative 2 %   Eosinophils Absolute 0.1 0.0 - 0.5 K/uL   Basophils Relative 1 %   Basophils Absolute 0.0 0.0 - 0.1 K/uL   Immature Granulocytes 0 %   Abs Immature Granulocytes 0.01 0.00 - 0.07 K/uL    Comment: Performed at [facility], [place] [address], [place], [place] [address]  Comprehensive metabolic panel     Status: Abnormal   Collection Time: 11/07/17  6:43 PM  Result Value Ref Range   Sodium 136 135 - 145 mmol/L   Potassium 3.8 3.5 - 5.1 mmol/L   Chloride 93 (L) 98 - 111 mmol/L   CO2 28 22 - 32 mmol/L   Glucose, Bld 268 (H) 70 - 99 mg/dL   BUN 28 (H) 6 - 20 mg/dL   Creatinine, Ser 5.57 (H) 0.61 - 1.24 mg/dL   Calcium 8.9 8.9 - 10.3 mg/dL   Total Protein 7.7 6.5 - 8.1 g/dL   Albumin 4.0 3.5 - 5.0 g/dL   AST 18 15 - 41 U/L   ALT 15 0 - 44 U/L    Alkaline Phosphatase 77 38 - 126 U/L   Total Bilirubin 1.3 (H) 0.3 - 1.2 mg/dL   GFR calc non Af Amer 11 (L) >60 mL/min   GFR calc Af Wyvonnia Lora  13 (L) >60 mL/min    Comment: (NOTE) The eGFR has been calculated using the CKD EPI equation. This calculation has not been validated in all clinical situations. eGFR's persistently <60 mL/min signify possible Chronic Kidney Disease.    Anion gap 15 5 - 15    Comment: Performed at [facility], [place] [address] [place], [place] [address]  Brain natriuretic peptide     Status: Abnormal   Collection Time: 11/07/17  6:43 PM  Result Value Ref Range   B Natriuretic Peptide 2,339.0 (H) 0.0 - 100.0 pg/mL    Comment: Performed at [facility], [place] [address] [place], [place] [address]  I-Stat CG4 Lactic Acid, ED     Status: None   Collection Time: 11/07/17  7:00 PM  Result Value Ref Range   Lactic Acid, Venous 1.83 0.5 - 1.9 mmol/L  I-Stat Troponin, ED (not at [facility])     Status: Abnormal   Collection Time: 11/07/17  7:53 PM  Result Value Ref Range   Troponin i, poc 0.10 (HH) 0.00 - 0.08 ng/mL   Comment NOTIFIED PHYSICIAN    Comment 3            Comment: Due to the release kinetics of cTnI, a negative result within the first hours of the onset of symptoms does not rule out myocardial infarction with certainty. If myocardial infarction is still suspected, repeat the test at appropriate intervals.   I-Stat CG4 Lactic Acid, ED     Status: None   Collection Time: 11/07/17  7:57 PM  Result Value Ref Range   Lactic Acid, Venous 1.67 0.5 - 1.9 mmol/L   Dg Chest 2 View  Result Date: 11/07/2017 CLINICAL DATA:  Asthma.  Cough.  Diabetic. EXAM: CHEST - 2 VIEW COMPARISON:  09/05/2017 FINDINGS: Both views are degraded by patient size and technique. Midline trachea. Cardiomegaly accentuated by AP portable technique. No pleural effusion or pneumothorax. Pulmonary interstitial prominence is moderate, greater on the left. Is  accentuated by low lung volumes. Prior median sternotomy. IMPRESSION: Cardiomegaly with pulmonary interstitial prominence, suspicious for pulmonary venous congestion. No overt congestive failure. Electronically Signed   By: Abigail Miyamoto M.D.   On: 11/07/2017 20:23   Ct Tibia Fibula Right W Contrast  Result Date: 11/07/2017 CLINICAL DATA:  RIGHT lower extremity puncture wound 2 weeks ago. 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 EXAM: CT OF THE LOWER RIGHT EXTREMITY WITH CONTRAST TECHNIQUE: Multidetector CT imaging of the lower right extremity (lower femur through hindfoot) was performed according to the standard protocol following intravenous contrast administration. COMPARISON:  RIGHT tibia and fibular radiographs August 29, 2016 CONTRAST:  173m ISOVUE-300 IOPAMIDOL (ISOVUE-300) INJECTION 61% FINDINGS: Bones/Joint/Cartilage No fracture or dislocation. No destructive bony lesions. Mild knee capsular calcification. Joint spaces maintained. 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 Moderate talar navicular osteoarthrosis. Ligaments Suboptimally assessed by CT. Muscles and Tendons Not suspicious. Soft tissues Minimal skin irregularity mid to distal anterior tibia, possible laceration. Generalized subcutaneous edema and trace effusion without focal fluid collection, subcutaneous gas or radiopaque foreign bodies. Severe vascular calcifications and extensive varicosities. Medial proximal tibial vascular clips. IMPRESSION: 1. Lower extremity interstitial edema and/or cellulitis. No subcutaneous gas or abscess. No CT findings of osteomyelitis. 2. Extensive varicosities, potential thrombophlebitis. 3. Advanced degenerative change of the ankle without fracture or dislocation. 4. Severe vascular calcifications. Electronically Signed   By: CElon AlasM.D.   On: 11/07/2017 20:17  EKG Interpretation  Date/Time:  Wednesday  November 07 2017 18:12:19 EST Ventricular Rate:  110 PR Interval:    QRS Duration: 92 QT Interval:  334 QTC Calculation: 452 R Axis:   16 Text Interpretation:  Sinus tachycardia Atrial premature complex Anterior infarct, old Abnormal T, consider ischemia, lateral leads When compared to prior, t waves less sharp. Sinus tachycardia.  No STEMI Confirmed by Antony Blackbird [phone]) on 11/07/2017 6:31:17 PM   Pending Labs Unresulted Labs (From admission, onward)    Start     Ordered   11/07/17 1806  Blood Culture (routine x 2)  BLOOD CULTURE X 2,   STAT     11/07/17 1805          Vitals/Pain Today's Vitals   11/07/17 2100 11/07/17 2103 11/07/17 2200 11/07/17 2202  BP: 139/77 139/77 (!) 164/88   Pulse:  (!) 107 (!) 50   Resp: _0 TempSrc:      SpO2:  98% 100%   Weight:      Height:      PainSc:    7     Isolation Precautions No active isolations  Medications Medications  cefTRIAXone (ROCEPHIN) 2 g in sodium chloride 0.9 % 100 mL IVPB (0 g Intravenous Stopped 11/07/17 2202)  iopamidol (ISOVUE-300) 61 % injection (has no administration in time range)  sodium chloride (PF) 0.9 % injection (has no administration in time range)  ipratropium-albuterol (DUONEB) 0.5-2.5 (3) MG/3ML nebulizer solution 3 mL (3 mLs Nebulization Given 11/07/17 1953)  HYDROmorphone (DILAUDID) injection 0.5 mg (0.5 mg Intravenous Given 11/07/17 1853)  vancomycin (VANCOCIN) 2,500 mg in sodium chloride 0.9 % 500 mL IVPB (0 mg Intravenous Stopped 11/07/17 2202)  ondansetron (ZOFRAN-ODT) disintegrating tablet 4 mg (4 mg Oral Given 11/07/17 1837)  amLODipine (NORVASC) tablet 10 mg (10 mg Oral Given 11/07/17 1930)  metoprolol tartrate (LOPRESSOR) tablet 50 mg (50 mg Oral Given 11/07/17 1852)  metoprolol tartrate (LOPRESSOR) tablet 25 mg (25 mg Oral Given 11/07/17 1914)  iopamidol (ISOVUE-300) 61 % injection 100 mL (100 mLs Intravenous Contrast Given 11/07/17 1924)    Mobility walks

## 2017-11-07 NOTE — ED Provider Notes (Signed)
[facility]-EMERGENCY DEPT Provider Note   CSN: [id number] Arrival date & time: 11/07/17  1308     History   Chief Complaint Chief Complaint  Patient presents with  . Leg Injury    HPI Russell [name] is a 47 y.o. male.  HPI  Patient is a 47 year old male with a history of COPD, CIDP, CAD, DVT (on xarelto), ESRD (on dialysis), hypertension, hypothyroidism, T2DM, who presents to the emergency department today for evaluation of a right lower extremity wound.  Patient is a 2 weeks ago he hit his leg on a trailer and suffered a puncture wound.  He was seen by his PCP and has been on 2 rounds of outpatient antibiotics.  States he is taking doxycycline and Augmentin with no significant relief of symptoms.  States that he has had subjective fevers, redness/swelling to the leg, drainage from the leg and worsening pain.   States that he has had a bit of a dry cough for the last week and currently has some shortness of breath due to the position he is laying in his bed.  States he has a history of COPD but has not used his inhalers because he has not felt significantly short of breath.  No chest pain.  Tdap was updated last year.  Patient completed dialysis prior to arrival.  He was seen by his PCP and was advised to come to the ED for further evaluation/treatment.  Past Medical History:  Diagnosis Date  . Anxiety   . Asthma   . Bilateral foot-drop 10/30/2017  . 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 Daily headache   . Depression   . DVT (deep venous thrombosis), H/o 01/2014-on Xarelto 03/24/2014   LLE  . ESRD (end stage renal disease) on dialysis [facility])    "[place], MWF" (06/29/2016)  . Gait abnormality 12/22/2016  . Gastroparesis 12/22/2016  .  Hypertension   . Hypothyroidism   . LBBB (left bundle branch block) 09/06/2017  . Nephrotic syndrome 05/18/2014  . Neuropathy   . Type 2 diabetes mellitus with diabetic nephropathy [facility])     Patient Active Problem List   Diagnosis Date Noted  . Cellulitis 11/07/2017  . Bilateral foot-drop 10/30/2017  . LBBB (left bundle branch block) 09/06/2017  . Hypertensive crisis 09/05/2017  . Elevated LFTs 09/05/2017  . Erosive gastritis with hemorrhage   . Dry eye syndrome of both eyes   . ESRD on dialysis (HCC)   . Labile blood glucose   . Benign essential HTN   . Neuropathic pain   . Debility 05/25/2017  . Diabetic peripheral neuropathy (HCC)   . Gastroparesis due to DM (HCC)   . Acute blood loss anemia   . Abnormal EKG   . Dysphagia   . Supplemental oxygen dependent   . Pulmonary edema 05/20/2017  . Chronic pain syndrome 04/23/2017  . Chronic peripheral neuropathic pain 04/23/2017  . CAP (community acquired pneumonia) 03/01/2017  . Anxiety 02/06/2017  . Asthma 02/06/2017  . 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 Hyperlipidemia 02/06/2017  . Macular degeneration 02/06/2017  . Restrictive airway disease 02/06/2017  . 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 Quadriparesis (HCC) 12/22/2016  . Gait abnormality 12/22/2016  . Hypertensive urgency 10/13/2016  . Acute pulmonary edema (HCC)   . Weakness   .  Hyperkalemia 09/26/2016  . History of DVT of lower extremity 08/17/2016  . Arteriosclerotic vascular disease 08/17/2016  . HTN (hypertension), malignant 06/29/2016  . Hypoxemic respiratory failure, chronic (HCC) 05/16/2016  . Anemia due to end stage renal disease (HCC) 05/16/2016  . Pre-transplant evaluation for kidney transplant 04/25/2016  . S/P CABG (coronary artery bypass graft) 09/28/2015  . Coronary artery disease involving native coronary artery of native heart without angina pectoris   . Nephrotic syndrome 01/03/2015  . Essential hypertension,  benign   . Diabetic ketoacidosis without coma associated with type 2 diabetes mellitus (HCC)   . Hx of gastroesophageal reflux (GERD) 09/30/2014  . Uncontrolled diabetes mellitus type 2 with peripheral artery disease (HCC) 09/21/2014  . Morbid obesity (HCC) 09/21/2014  . Proteinuria 07/09/2014  . Chest pain 04/22/2014  . Superficial thrombophlebitis 03/24/2014  . Hypothyroidism (acquired) 03/24/2014    Past Surgical History:  Procedure Laterality Date  . ANKLE FRACTURE SURGERY Right 1988  . AV FISTULA PLACEMENT Left 01/01/2015   Procedure: CREATION OF LEFT RADIAL CEPHALIC ARTERIOVENOUS (AV) FISTULA ;  Surgeon: Pryor Ochoa, MD;  Location: [facility] OR;  Service: Vascular;  Laterality: Left;  . BIOPSY  08/07/2017   Procedure: BIOPSY;  Surgeon: Sherrilyn Rist, MD;  Location: [facility];  Service: Gastroenterology;;  . CAPD REMOVAL N/A 05/19/2016   Procedure: PD CATH REMOVAL;  Surgeon: Abigail Miyamoto, MD;  Location: [facility] OR;  Service: General;  Laterality: N/A;  . CARDIAC CATHETERIZATION N/A 09/22/2015   Procedure: Left Heart Cath and Coronary Angiography;  Surgeon: Iran Ouch, MD;  Location: [facility] INVASIVE CV LAB;  Service: Cardiovascular;  Laterality: N/A;  . CORONARY ARTERY BYPASS GRAFT N/A 09/28/2015   Procedure: CORONARY ARTERY BYPASS GRAFTING (CABG) x 3 UTILIZING LEFT MAMMARY ARTERY AND ENDOSCOPICALLY HARVESTED LEFT GREATER SAPHENOUS VEIN.;  Surgeon: Delight Ovens, MD;  Location: [facility] OR;  Service: Open Heart Surgery;  Laterality: N/A;  . ENDOVEIN HARVEST OF GREATER SAPHENOUS VEIN Left 09/28/2015   Procedure: ENDOVEIN HARVEST OF GREATER SAPHENOUS VEIN;  Surgeon: Delight Ovens, MD;  Location: [facility] OR;  Service: Open Heart Surgery;  Laterality: Left;  . ESOPHAGOGASTRODUODENOSCOPY N/A 03/29/2014   Procedure: ESOPHAGOGASTRODUODENOSCOPY (EGD);  Surgeon: Dorena Cookey, MD;  Location: [facility];  Service: Endoscopy;  Laterality: N/A;  . ESOPHAGOGASTRODUODENOSCOPY (EGD) WITH PROPOFOL N/A  08/07/2017   Procedure: ESOPHAGOGASTRODUODENOSCOPY (EGD) WITH PROPOFOL;  Surgeon: Sherrilyn Rist, MD;  Location: [facility]COPY;  Service: Gastroenterology;  Laterality: N/A;  . EYE SURGERY    . FRACTURE SURGERY    . INSERTION OF DIALYSIS CATHETER Right 01/05/2015   Procedure: INSERTION OF RIGHT INTERNAL JUGULAR DIALYSIS CATHETER;  Surgeon: Fransisco Hertz, MD;  Location: [facility] OR;  Service: Vascular;  Laterality: Right;  . IR IMAGING GUIDED PORT INSERTION  06/19/2017  . LEFT HEART CATH AND CORS/GRAFTS ANGIOGRAPHY N/A 09/13/2016   Procedure: LEFT HEART CATH AND CORS/GRAFTS ANGIOGRAPHY;  Surgeon: Kathleene Hazel, MD;  Location: [facility] INVASIVE CV LAB;  Service: Cardiovascular;  Laterality: N/A;  . RETINAL LASER PROCEDURE Bilateral   . RIGHT/LEFT HEART CATH AND CORONARY/GRAFT ANGIOGRAPHY N/A 03/06/2017   Procedure: RIGHT/LEFT HEART CATH AND CORONARY/GRAFT ANGIOGRAPHY;  Surgeon: Marykay Lex, MD;  Location: [facility] INVASIVE CV LAB;  Service: Cardiovascular;  Laterality: N/A;  . TEE WITHOUT CARDIOVERSION N/A 09/28/2015   Procedure: TRANSESOPHAGEAL ECHOCARDIOGRAM (TEE);  Surgeon: Delight Ovens, MD;  Location: [facility] OR;  Service: Open Heart Surgery;  Laterality: N/A;  . TONSILLECTOMY AND ADENOIDECTOMY  1970s  Home Medications    Prior to Admission medications   Medication Sig Start Date End Date Taking? Authorizing Provider  acetaminophen (TYLENOL) 325 MG tablet Take 1-2 tablets (325-650 mg total) by mouth every 4 (four) hours as needed for mild pain. 06/01/17  Yes Love, Evlyn Kanner, PA-C  amLODipine (NORVASC) 10 MG tablet Take 1 tablet (10 mg total) by mouth daily. 09/25/17 10/26/18 Yes Jodelle Gross, NP  amoxicillin-clavulanate (AUGMENTIN) 500-125 MG tablet Take 1 tablet by mouth 2 (two) times daily with a meal. 10/29/17  Yes [provider]  aspirin EC 81 MG EC tablet Take 1 tablet (81 mg total) by mouth daily. 10/06/15  Yes Barrett, Erin R, PA-C  atorvastatin (LIPITOR) 80 MG tablet  Take 1 tablet (80 mg total) by mouth daily at 6 PM. 01/05/17  Yes Azalee Course, PA  calcitRIOL (ROCALTROL) 0.25 MCG capsule Take 5 capsules (1.25 mcg total) by mouth every Monday, Wednesday, and Friday with hemodialysis. 06/01/17  Yes Love, Evlyn Kanner, PA-C  calcium acetate (PHOSLO) 667 MG capsule Take 2,001 mg by mouth 3 (three) times daily with meals.   Yes [provider]  DULoxetine (CYMBALTA) 30 MG capsule Take 1 capsule (30 mg total) by mouth daily. 06/12/17  Yes [name], MD  HYDROcodone-acetaminophen (NORCO/VICODIN) 5-325 MG tablet Take 1 tablet by mouth every 12 (twelve) hours as needed for severe pain. Patient taking differently: Take 1 tablet by mouth every 6 (six) hours as needed for severe pain.  06/01/17  Yes Love, Evlyn Kanner, PA-C  insulin aspart (NOVOLOG FLEXPEN) 100 UNIT/ML FlexPen Inject 8-20 Units into the skin 3 (three) times daily with meals. Patient taking differently: Inject 5-10 Units into the skin 3 (three) times daily with meals. Per sliding scale 04/23/17  Yes Carlus Pavlov, MD  levothyroxine (SYNTHROID, LEVOTHROID) 75 MCG tablet Take 1 tablet (75 mcg total) by mouth daily before breakfast. 07/26/17  Yes Carlus Pavlov, MD  metoCLOPramide (REGLAN) 5 MG tablet Take 2 tablets (10 mg total) by mouth 2 (two) times daily. 09/08/17  Yes Standley Brooking, MD  metoprolol tartrate (LOPRESSOR) 50 MG tablet Take 1 tablet (50 mg total) by mouth 2 (two) times daily. 11/01/17  Yes Jodelle Gross, NP  mycophenolate (CELLCEPT) 250 MG capsule Take 1 capsule (250 mg total) by mouth 2 (two) times daily. 10/30/17  Yes York Spaniel, MD  pregabalin (LYRICA) 75 MG capsule Take 1 capsule (75 mg total) by mouth daily. 06/12/17  Yes [name], MD  PRIVIGEN 40 GM/400ML SOLN Inject every 21 Days. 10/31/17  Yes [provider]  albuterol (PROVENTIL HFA;VENTOLIN HFA) 108 (90 Base) MCG/ACT inhaler Inhale 1 puff into the lungs every 6 (six) hours as needed for wheezing or shortness  of breath. Patient taking differently: Inhale 2 puffs into the lungs every 8 (eight) hours as needed for wheezing or shortness of breath.  03/11/15   [name], MD  cyclobenzaprine (FLEXERIL) 10 MG tablet Take 10 mg by mouth 2 (two) times daily as needed for muscle spasms.    [provider]  Insulin Glargine (BASAGLAR KWIKPEN) 100 UNIT/ML SOPN Inject 0.3 mLs (30 Units total) into the skin 2 (two) times daily. 09/08/17   Standley Brooking, MD  Insulin Pen Needle (PEN NEEDLES) 32G X 4 MM MISC 1 Units by Does not apply route 6 (six) times daily. As needed and as directed by physician 06/12/17   [name], MD    Family History Family History  Problem  Relation Age of Onset  . Obesity Mother        Patient states that family members have no other medical illnesses other than what I have described  . Kidney cancer Maternal Grandmother   . Cancer Father 50       AML  . Heart disease Unknown        No family history    Social History Social History   Tobacco Use  . Smoking status: Never Smoker  . Smokeless tobacco: Never Used  Substance Use Topics  . Alcohol use: Yes    Alcohol/week: 0.0 standard drinks    Comment: 06/29/2016 "might have a few drinks/year"  . Drug use: No     Allergies   Nsaids and Tape   Review of Systems Review of Systems  Constitutional: Positive for chills and diaphoresis.  HENT: Negative for congestion.   Eyes: Negative for visual disturbance.  Respiratory: Positive for cough and shortness of breath (positional, chronic).   Cardiovascular: Negative for chest pain.  Gastrointestinal: Negative for abdominal pain, constipation, diarrhea, nausea and vomiting.  Genitourinary: Negative for flank pain.  Musculoskeletal:       Right leg pain  Skin: Positive for color change and wound.  Neurological: Negative for headaches.     Physical Exam Updated Vital Signs BP (!) 164/88   Pulse (!) 50   Resp 20   Ht 6\' 2"  (1.88 m)   Wt 106.6 kg   SpO2  100%   BMI 30.17 kg/m   Physical Exam  Constitutional: He appears well-developed and well-nourished.  HENT:  Head: Normocephalic and atraumatic.  Eyes: Conjunctivae are normal.  Neck: Neck supple.  Cardiovascular: Regular rhythm.  No murmur heard. Tachycardic  Pulmonary/Chest:  Increased work of breathing, decreased breath sounds throughout, no wheezes  Abdominal: Soft. Bowel sounds are normal. There is no tenderness.  Musculoskeletal:  BLE edema. Wound to RLE as pictured below. TTP to the RLE with warmth and erythema. No crepitus.   Neurological: He is alert.  Skin: Skin is warm and dry.  Psychiatric: He has a normal mood and affect.  Nursing note and vitals reviewed.        ED Treatments / Results  Labs (all labs ordered are listed, but only abnormal results are displayed) Labs Reviewed  CBC WITH DIFFERENTIAL/PLATELET - Abnormal; Notable for the following components:      Result Value   RBC 4.04 (*)    Hemoglobin 11.9 (*)    HCT 37.6 (*)    RDW 15.7 (*)    Lymphs Abs 0.4 (*)    All other components within normal limits  COMPREHENSIVE METABOLIC PANEL - Abnormal; Notable for the following components:   Chloride 93 (*)    Glucose, Bld 268 (*)    BUN 28 (*)    Creatinine, Ser 5.57 (*)    Total Bilirubin 1.3 (*)    GFR calc non Af Amer 11 (*)    GFR calc Af Amer 13 (*)    All other components within normal limits  BRAIN NATRIURETIC PEPTIDE - Abnormal; Notable for the following components:   B Natriuretic Peptide 2,339.0 (*)    All other components within normal limits  I-STAT TROPONIN, ED - Abnormal; Notable for the following components:   Troponin i, poc 0.10 (*)    All other components within normal limits  CULTURE, BLOOD (ROUTINE X 2)  CULTURE, BLOOD (ROUTINE X 2)  I-STAT CG4 LACTIC ACID, ED  I-STAT CG4 LACTIC ACID, ED  EKG EKG Interpretation  Date/Time:  Wednesday November 07 2017 18:12:19 EST Ventricular Rate:  110 PR Interval:    QRS  Duration: 92 QT Interval:  334 QTC Calculation: 452 R Axis:   16 Text Interpretation:  Sinus tachycardia Atrial premature complex Anterior infarct, old Abnormal T, consider ischemia, lateral leads When compared to prior, t waves less sharp. Sinus tachycardia.  No STEMI Confirmed by Theda Belfast ([id number]) on 11/07/2017 6:31:17 PM   Radiology Dg Chest 2 View  Result Date: 11/07/2017 CLINICAL DATA:  Asthma.  Cough.  Diabetic. EXAM: CHEST - 2 VIEW COMPARISON:  09/05/2017 FINDINGS: Both views are degraded by patient size and technique. Midline trachea. Cardiomegaly accentuated by AP portable technique. No pleural effusion or pneumothorax. Pulmonary interstitial prominence is moderate, greater on the left. Is accentuated by low lung volumes. Prior median sternotomy. IMPRESSION: Cardiomegaly with pulmonary interstitial prominence, suspicious for pulmonary venous congestion. No overt congestive failure. Electronically Signed   By: Jeronimo Greaves M.D.   On: 11/07/2017 20:23   Ct Tibia Fibula Right W Contrast  Result Date: 11/07/2017 CLINICAL DATA:  RIGHT lower extremity puncture wound 2 weeks ago. 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 EXAM: CT OF THE LOWER RIGHT EXTREMITY WITH CONTRAST TECHNIQUE: Multidetector CT imaging of the lower right extremity (lower femur through hindfoot) was performed according to the standard protocol following intravenous contrast administration. COMPARISON:  RIGHT tibia and fibular radiographs August 29, 2016 CONTRAST:  ISOVUE-300 IOPAMIDOL (ISOVUE-300) INJECTION 61% FINDINGS: Bones/Joint/Cartilage No fracture or dislocation. No destructive bony lesions. Mild knee capsular calcification. Joint spaces maintained. 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 Moderate talar navicular osteoarthrosis. Ligaments Suboptimally assessed by CT. Muscles and Tendons Not suspicious. Soft tissues  Minimal skin irregularity mid to distal anterior tibia, possible laceration. Generalized subcutaneous edema and trace effusion without focal fluid collection, subcutaneous gas or radiopaque foreign bodies. Severe vascular calcifications and extensive varicosities. Medial proximal tibial vascular clips. IMPRESSION: 1. Lower extremity interstitial edema and/or cellulitis. No subcutaneous gas or abscess. No CT findings of osteomyelitis. 2. Extensive varicosities, potential thrombophlebitis. 3. Advanced degenerative change of the ankle without fracture or dislocation. 4. Severe vascular calcifications. Electronically Signed   By: Awilda Metro M.D.   On: 11/07/2017 20:17    Procedures Procedures (including critical care time)  CRITICAL CARE Performed by: Karrie Meres   Total critical care time: 37 minutes  Critical care time was exclusive of separately billable procedures and treating other patients.  Critical care was necessary to treat or prevent imminent or life-threatening deterioration.  Critical care was time spent personally by me on the following activities: development of treatment plan with patient and/or surrogate as well as nursing, discussions with consultants, evaluation of patient's response to treatment, examination of patient, obtaining history from patient or surrogate, ordering and performing treatments and interventions, ordering and review of laboratory studies, ordering and review of radiographic studies, pulse oximetry and re-evaluation of patient's condition.   Medications Ordered in ED Medications  cefTRIAXone (ROCEPHIN) 2 g in sodium chloride 0.9 % 100 mL IVPB (0 g Intravenous Stopped 11/07/17 2202)  iopamidol (ISOVUE-300) 61 % injection (has no administration in time range)  sodium chloride (PF) 0.9 % injection (has no administration in time range)  ipratropium-albuterol (DUONEB) 0.5-2.5 (3) MG/3ML nebulizer solution 3 mL (3 mLs Nebulization Given 11/07/17 1953)   HYDROmorphone (DILAUDID) injection 0.5 mg (0.5 mg Intravenous Given 11/07/17 1853)  vancomycin (VANCOCIN) 2,500 mg in sodium  chloride 0.9 % 500 mL IVPB (0 mg Intravenous Stopped 11/07/17 2202)  ondansetron (ZOFRAN-ODT) disintegrating tablet 4 mg (4 mg Oral Given 11/07/17 1837)  amLODipine (NORVASC) tablet 10 mg (10 mg Oral Given 11/07/17 1930)  metoprolol tartrate (LOPRESSOR) tablet 50 mg (50 mg Oral Given 11/07/17 1852)  metoprolol tartrate (LOPRESSOR) tablet 25 mg (25 mg Oral Given 11/07/17 1914)  iopamidol (ISOVUE-300) 61 % injection 100 mL (100 mLs Intravenous Contrast Given 11/07/17 1924)  fentaNYL (SUBLIMAZE) injection 25 mcg (25 mcg Intravenous Given 11/07/17 2243)     Initial Impression / Assessment and Plan / ED Course  I have reviewed the triage vital signs and the nursing notes.  Pertinent labs & imaging results that were available during my care of the patient were reviewed by me and considered in my medical decision making (see chart for details).     Final Clinical Impressions(s) / ED Diagnoses   Final diagnoses:  Wound infection  Elevated troponin  Hypoxia   Patient presented to ED today for evaluation of right lower extremity wound that occurred 2 weeks ago.  Has been on doxycycline and Augmentin as an outpatient with no relief of symptoms.  He has had worsening of his symptoms including redness, warmth, and drainage from the wound.  Has had some subjective fevers at home.  Temp here was normal orally and he refused a rectal temp.  He is tachycardic and hypertensive.  He has not taken his blood pressure medications today.  Normal respirations, low oxygen saturation.  Patient was placed on 2 L due to having O2 sats at around 90% on room air.    Patient has no leukocytosis, anemia is at baseline.  CMP is consistent with prior.  No gross electrolyte derangement.  Lactic acid is negative.  His chest x-ray shows some pulmonary interstitial prominence. He seems to be somewhat fluid  overloaded despite him going to dialysis today.  His BNP is elevated, though would expect this in patient with history of CKD.  Troponin is elevated at 0.1, suspect demand ischemia from his current infection.  Will likely benefit from trending of troponins.  CT scan of the right tib-fib without evidence of soft tissue gas or obvious abscess, though is consistent with interstitial edema/cellulitis.  Also with potential thrombophlebitis.  Patient will require admission for IV antibiotics for further treatment of his lower extremity wound.  He will also likely benefit from trending of his troponins given his elevated troponin today.  Consult with hospitalist service, Dr. Toniann Fail, who accepts the patient for admission.  ED Discharge Orders    None       Rayne Du 11/07/17 1610    Tegeler, Canary Brim, MD 11/07/17 [phone]

## 2017-11-07 NOTE — Telephone Encounter (Signed)
Thank you :)

## 2017-11-07 NOTE — Progress Notes (Signed)
Russell [name] is a 47 y.o. male patient admitted from [facility] awake, alert - oriented  X 4. Pt reports being extremely nauseous and actively gagging.   VSS - Blood pressure (!) 164/88, pulse (!) 50, resp. rate 20, height 6\' 2"  (1.88 m), weight 106.6 kg, SpO2 100 %.   Chest port in place, occlusive dsg intact without redness.  Orientation to room, and floor completed with information packet given to patient/family.  Patient declined safety video at this time.  Admission INP armband ID verified with patient/family, and in place.   Fall assessment complete, with patient able to verbalize understanding of risk associated with falls, and verbalized understanding to call nsg before up out of bed.  Call light within reach, patient able to voice, and demonstrate understanding.  Skin assessment complete. Puncture wound on R leg assessed, and dressing changed with Kerlex wrap.  Pt reported to have bigeminy PVCs during transport. Pt placed on tele box 13. Pt placed on continuous pulse ox. Pt placed on 2L [place]. CHG completed.    Will cont to eval and treat per MD orders.  Dierdre Forth, RN 11/07/2017 11:35 PM

## 2017-11-07 NOTE — Progress Notes (Signed)
Subjective:    Patient: Russell [name] "Russell [name]"  DOB: 11-12-1970; 47 y.o.   MRN: [id number]  Chief Complaint  Patient presents with  . Wound Infection    pt states he hit his leg 2 weeks ago and now have a wound that will not heal and look infected. ( Right Leg)    HPI Wound 2 weeks ago - hsa been on doxycycline and then augmentin but hasn't improved - was treated by his dialysis doctor. Getting worse pain which started a week ago and is now spreading distally with several new wounds opening up below it. Getting very foul odor and draining bright yellow-green pus.  Very painful, red, hot.  Whole leg exquisitely painful.  Medical History Past Medical History:  Diagnosis Date  . Anxiety   . Asthma   . Bilateral foot-drop 10/30/2017  . 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 Daily headache   . Depression   . DVT (deep venous thrombosis), H/o 01/2014-on Xarelto 03/24/2014   LLE  . ESRD (end stage renal disease) on dialysis [facility])    "[place], MWF" (06/29/2016)  . Gait abnormality 12/22/2016  . Gastroparesis 12/22/2016  . Hypertension   . Hypothyroidism   . LBBB (left bundle branch block) 09/06/2017  . Nephrotic syndrome 05/18/2014  . Neuropathy   . Type 2 diabetes mellitus with diabetic nephropathy [facility])    Past Surgical History:  Procedure Laterality Date  . ANKLE FRACTURE SURGERY Right 1988  . AV FISTULA PLACEMENT Left 01/01/2015   Procedure: CREATION OF LEFT RADIAL CEPHALIC ARTERIOVENOUS (AV) FISTULA ;  Surgeon: Pryor Ochoa, MD;  Location: [facility] OR;  Service: Vascular;  Laterality: Left;  . BIOPSY  08/07/2017   Procedure: BIOPSY;  Surgeon: Sherrilyn Rist, MD;  Location: [facility];  Service: Gastroenterology;;  . CAPD REMOVAL N/A 05/19/2016   Procedure: PD  CATH REMOVAL;  Surgeon: Abigail Miyamoto, MD;  Location: [facility] OR;  Service: General;  Laterality: N/A;  . CARDIAC CATHETERIZATION N/A 09/22/2015   Procedure: Left Heart Cath and Coronary Angiography;  Surgeon: Iran Ouch, MD;  Location: [facility] INVASIVE CV LAB;  Service: Cardiovascular;  Laterality: N/A;  . CORONARY ARTERY BYPASS GRAFT N/A 09/28/2015   Procedure: CORONARY ARTERY BYPASS GRAFTING (CABG) x 3 UTILIZING LEFT MAMMARY ARTERY AND ENDOSCOPICALLY HARVESTED LEFT GREATER SAPHENOUS VEIN.;  Surgeon: Delight Ovens, MD;  Location: [facility] OR;  Service: Open Heart Surgery;  Laterality: N/A;  . ENDOVEIN HARVEST OF GREATER SAPHENOUS VEIN Left 09/28/2015   Procedure: ENDOVEIN HARVEST OF GREATER SAPHENOUS VEIN;  Surgeon: Delight Ovens, MD;  Location: [facility] OR;  Service: Open Heart Surgery;  Laterality: Left;  . ESOPHAGOGASTRODUODENOSCOPY N/A 03/29/2014   Procedure: ESOPHAGOGASTRODUODENOSCOPY (EGD);  Surgeon: Dorena Cookey, MD;  Location: [facility];  Service: Endoscopy;  Laterality: N/A;  . ESOPHAGOGASTRODUODENOSCOPY (EGD) WITH PROPOFOL N/A 08/07/2017   Procedure: ESOPHAGOGASTRODUODENOSCOPY (EGD) WITH PROPOFOL;  Surgeon: Sherrilyn Rist, MD;  Location: [facility] ENDOSCOPY;  Service: Gastroenterology;  Laterality: N/A;  . EYE SURGERY    . FRACTURE SURGERY    . INSERTION OF DIALYSIS CATHETER Right 01/05/2015   Procedure: INSERTION OF RIGHT INTERNAL JUGULAR DIALYSIS CATHETER;  Surgeon: Fransisco Hertz, MD;  Location: [facility] OR;  Service: Vascular;  Laterality: Right;  . IR IMAGING GUIDED PORT INSERTION  06/19/2017  .  LEFT HEART CATH AND CORS/GRAFTS ANGIOGRAPHY N/A 09/13/2016   Procedure: LEFT HEART CATH AND CORS/GRAFTS ANGIOGRAPHY;  Surgeon: Kathleene Hazel, MD;  Location: [facility] INVASIVE CV LAB;  Service: Cardiovascular;  Laterality: N/A;  . RETINAL LASER PROCEDURE Bilateral   . RIGHT/LEFT HEART CATH AND CORONARY/GRAFT ANGIOGRAPHY N/A 03/06/2017   Procedure: RIGHT/LEFT HEART CATH AND CORONARY/GRAFT ANGIOGRAPHY;  Surgeon:  Marykay Lex, MD;  Location: [facility] INVASIVE CV LAB;  Service: Cardiovascular;  Laterality: N/A;  . TEE WITHOUT CARDIOVERSION N/A 09/28/2015   Procedure: TRANSESOPHAGEAL ECHOCARDIOGRAM (TEE);  Surgeon: Delight Ovens, MD;  Location: [facility] OR;  Service: Open Heart Surgery;  Laterality: N/A;  . TONSILLECTOMY AND ADENOIDECTOMY  1970s   Current Outpatient Medications on File Prior to Visit  Medication Sig Dispense Refill  . acetaminophen (TYLENOL) 325 MG tablet Take 1-2 tablets (325-650 mg total) by mouth every 4 (four) hours as needed for mild pain.    Marland Kitchen albuterol (PROVENTIL HFA;VENTOLIN HFA) 108 (90 Base) MCG/ACT inhaler Inhale 1 puff into the lungs every 6 (six) hours as needed for wheezing or shortness of breath. (Patient taking differently: Inhale 2 puffs into the lungs every 8 (eight) hours as needed for wheezing or shortness of breath. ) 1 Inhaler 11  . amLODipine (NORVASC) 10 MG tablet Take 1 tablet (10 mg total) by mouth daily. 30 tablet 5  . amoxicillin-clavulanate (AUGMENTIN) 500-125 MG tablet Take 1 tablet by mouth 2 (two) times daily with a meal.  0  . aspirin EC 81 MG EC tablet Take 1 tablet (81 mg total) by mouth daily.    Marland Kitchen atorvastatin (LIPITOR) 80 MG tablet Take 1 tablet (80 mg total) by mouth daily at 6 PM. 90 tablet 3  . calcitRIOL (ROCALTROL) 0.25 MCG capsule Take 5 capsules (1.25 mcg total) by mouth every Monday, Wednesday, and Friday with hemodialysis.    Marland Kitchen calcium acetate (PHOSLO) 667 MG capsule Take 2,001 mg by mouth 3 (three) times daily with meals.    . cyclobenzaprine (FLEXERIL) 10 MG tablet Take 10 mg by mouth 2 (two) times daily as needed for muscle spasms.    . DULoxetine (CYMBALTA) 30 MG capsule Take 1 capsule (30 mg total) by mouth daily. 90 capsule 1  . HYDROcodone-acetaminophen (NORCO/VICODIN) 5-325 MG tablet Take 1 tablet by mouth every 12 (twelve) hours as needed for severe pain. (Patient taking differently: Take 1 tablet by mouth every 6 (six) hours as needed for  severe pain. ) 10 tablet 0  . insulin aspart (NOVOLOG FLEXPEN) 100 UNIT/ML FlexPen Inject 8-20 Units into the skin 3 (three) times daily with meals. (Patient taking differently: Inject 5-10 Units into the skin 3 (three) times daily with meals. Per sliding scale) 15 mL 11  . Insulin Glargine (BASAGLAR KWIKPEN) 100 UNIT/ML SOPN Inject 0.3 mLs (30 Units total) into the skin 2 (two) times daily.    . Insulin Pen Needle (PEN NEEDLES) 32G X 4 MM MISC 1 Units by Does not apply route 6 (six) times daily. As needed and as directed by physician 540 each 4  . levothyroxine (SYNTHROID, LEVOTHROID) 75 MCG tablet Take 1 tablet (75 mcg total) by mouth daily before breakfast. 90 tablet 3  . metoCLOPramide (REGLAN) 5 MG tablet Take 2 tablets (10 mg total) by mouth 2 (two) times daily.    . metoprolol tartrate (LOPRESSOR) 50 MG tablet Take 1 tablet (50 mg total) by mouth 2 (two) times daily. 180 tablet 3  . mycophenolate (CELLCEPT) 250 MG capsule Take 1 capsule (250  mg total) by mouth 2 (two) times daily. 60 capsule 5  . pregabalin (LYRICA) 75 MG capsule Take 1 capsule (75 mg total) by mouth daily. 90 capsule 1  . PRIVIGEN 40 GM/400ML SOLN      No current facility-administered medications on file prior to visit.    Allergies  Allergen Reactions  . Nsaids Other (See Comments)    Told to avoid all nsaids due to kidney disease   . Tape Other (See Comments)    Welts result, if left for a long amount of time   Family History  Problem Relation Age of Onset  . Obesity Mother        Patient states that family members have no other medical illnesses other than what I have described  . Kidney cancer Maternal Grandmother   . Cancer Father 50       AML  . Heart disease Unknown        No family history   Social History   Socioeconomic History  . Marital status: Single    Spouse name: Not on file  . Number of children: 0  . Years of education: College  . Highest education level: Not on file  Occupational  History  . Occupation: Disabled    Comment: Data processing manager  Social Needs  . Financial resource strain: Not on file  . Food insecurity:    Worry: Not on file    Inability: Not on file  . Transportation needs:    Medical: Not on file    Non-medical: Not on file  Tobacco Use  . Smoking status: Never Smoker  . Smokeless tobacco: Never Used  Substance and Sexual Activity  . Alcohol use: Yes    Alcohol/week: 0.0 standard drinks    Comment: 06/29/2016 "might have a few drinks/year"  . Drug use: No  . Sexual activity: Yes  Lifestyle  . Physical activity:    Days per week: Not on file    Minutes per session: Not on file  . Stress: Not on file  Relationships  . Social connections:    Talks on phone: Not on file    Gets together: Not on file    Attends religious service: Not on file    Active member of club or organization: Not on file    Attends meetings of clubs or organizations: Not on file    Relationship status: Not on file  Other Topics Concern  . Not on file  Social History Narrative   Patient currently lives with his fiance   Works with facilities   Nonsmoker, nondrinker   Caffeine use: none   Right handed    Depression screen [facility] 2/9 11/07/2017 06/12/2017 04/02/2017 08/21/2016 08/17/2016  Decreased Interest 0 0 0 0 0  Down, Depressed, Hopeless 0 0 0 0 0  PHQ - 2 Score 0 0 0 0 0  Altered sleeping - - - - -  Tired, decreased energy - - - - -  Change in appetite - - - - -  Feeling bad or failure about yourself  - - - - -  Trouble concentrating - - - - -  Moving slowly or fidgety/restless - - - - -  Suicidal thoughts - - - - -  PHQ-9 Score - - - - -  Difficult doing work/chores - - - - -  Some recent data might be hidden    ROS As noted in HPI  Objective:  BP 128/64   Pulse 86  Temp 98.5 F (36.9 C) (Oral)   Resp 16   Ht 6\' 2"  (1.88 m)   Wt 262 lb (118.8 kg)   SpO2 90%   BMI 33.64 kg/m  Physical Exam  Constitutional: He is oriented to person, place,  and time. He appears well-developed and well-nourished. No distress.  HENT:  Head: Normocephalic and atraumatic.  Eyes: No scleral icterus.  Pulmonary/Chest: Effort normal.  Neurological: He is alert and oriented to person, place, and time.  Skin: Skin is warm and dry. He is not diaphoretic.  Rt anterior mid shin with bright erythema 6 cm radius surrounding 2 cm open wound draining covered with necrotic tissue and bright green pus with sweet/foul bacterial odor prominent throughout room.  More superficial wound newer on loewr anterior shin anterior ankle, top of foot.  Proximal nail fold and volar foot just proximal to MTP with blackish hue. 2+ edema, very ttp.  Psychiatric: He has a normal mood and affect. His behavior is normal.     Assessment & Plan:   1. Open leg wound, right, initial encounter   Needs to go to ER - concern for deeper infection tracking distally - needs imaging, labs, and poss surgical drainage. Failed 2 courses of o/p abx - doxy and augmentin - so needs IV abx. Pt agrees to go immed to ER - ok to go by private vehicle. [facility] notified.   See after visit summary for patient specific instructions.  Orders Placed This Encounter  Procedures  . Ambulatory referral to Wound Clinic    Referral Priority:   Urgent    Referral Type:   Consultation    Referral Reason:   Specialty Services Required    Requested Specialty:   Wound Care    Number of Visits Requested:   1     Patient verbalized to me that they understand the following: diagnosis, what is being done for them, what to expect and what should be done at home.  Their questions have been answered. They understand that I am unable to predict every possible medication interaction or adverse outcome and that if any unexpected symptoms arise, they should contact us and their pharmacist, as well as never hesitate to seek urgent/emergent care at [facility] or ER if they think it might be warranted.    Norberto Sorenson,  MD, MPH Primary Care at [facility] [address] [place], [place]  [phone] Office phone  [phone] Office fax  11/07/17 12:28 PM

## 2017-11-07 NOTE — Telephone Encounter (Signed)
spoke with tim Katrinka Blazing , charge nurse at [facility] to inform of pt arrival and symptoms. Pt is driving himself to the er

## 2017-11-07 NOTE — Progress Notes (Signed)
Pharmacy Antibiotic Note  Russell [name] is a 47 y.o. male admitted on 11/07/2017 with cellulitis.  Pharmacy has been consulted for Vancomycin dosing. WBC WNL. ESRD on HD MWF.   Plan: Vancomycin 2500 mg IV load (given), then give 1000 mg IV qHD MWF Ceftriaxone per MD Trend WBC, temp, HD schedule F/U infectious work-up Drug levels as indicated   Height: 6\' 2"  (188 cm) Weight: 235 lb (106.6 kg) IBW/kg (Calculated) : 82.2  Temp (24hrs), Avg:98.4 F (36.9 C), Min:98.3 F (36.8 C), Max:98.5 F (36.9 C)  Recent Labs  Lab 11/07/17 1843 11/07/17 1900 11/07/17 1957  WBC 6.0  --   --   CREATININE 5.57*  --   --   LATICACIDVEN  --  1.83 1.67    Estimated Creatinine Clearance: 21.6 mL/min (A) (by C-G formula based on SCr of 5.57 mg/dL (H)).    Allergies  Allergen Reactions  . Nsaids Other (See Comments)    Told to avoid all nsaids due to kidney disease   . Tape Other (See Comments)    Welts result, if left for a long amount of time    Abran Duke 11/07/2017 11:51 PM

## 2017-11-07 NOTE — H&P (Signed)
History and Physical    Russell [name] ZOX:[id number] DOB: 1970-10-23 DOA: 11/07/2017  PCP: [name], MD  Patient coming from: Home.  Chief Complaint: Right lower extremity erythema and wound with worsening discharge and pain.  HPI: Russell [name] is a 47 y.o. male with history of diabetes mellitus type 2 on insulin, ESRD on hemodialysis Monday Wednesday and Friday, CIDP, CAD status post CABG, hypertension, hypothyroidism, gastroparesis hurt his leg 2 weeks ago on his right anterior shin and since then has been having increasing swelling erythema and had gone to his PCP and was prescribed antibiotics which he has been taking for almost 2 weeks despite which patient is erythema has worsened with increasing discharge from the wound and pain.  Patient did have his dialysis done today but still feels mildly short of breath off and on on lying down for last 2 to 3 weeks.  Denies any chest pain.  ED Course: In the ER on exam patient has erythema of the right lower extremity on the anterior shin with wound.  Swelling extends from the anterior shin to the foot but no restriction of his ankle or knee joints.  CT scan shows features concerning for cellulitis with no definite abscess collection.  Cultures were obtained and started on empiric antibiotics and admitted for further management.  Patient did require some oxygen due to hypoxia and chest x-ray shows congestion concerning for fluid overload.  Patient did have some nausea vomiting after pain medication.  Denies any abdominal pain.  Review of Systems: As per HPI, rest all negative.   Past Medical History:  Diagnosis Date  . Anxiety   . Asthma   . Bilateral foot-drop 10/30/2017  . 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 Daily  headache   . Depression   . DVT (deep venous thrombosis), H/o 01/2014-on Xarelto 03/24/2014   LLE  . ESRD (end stage renal disease) on dialysis [facility])    "[place], MWF" (06/29/2016)  . Gait abnormality 12/22/2016  . Gastroparesis 12/22/2016  . Hypertension   . Hypothyroidism   . LBBB (left bundle branch block) 09/06/2017  . Nephrotic syndrome 05/18/2014  . Neuropathy   . Type 2 diabetes mellitus with diabetic nephropathy [facility])     Past Surgical History:  Procedure Laterality Date  . ANKLE FRACTURE SURGERY Right 1988  . AV FISTULA PLACEMENT Left 01/01/2015   Procedure: CREATION OF LEFT RADIAL CEPHALIC ARTERIOVENOUS (AV) FISTULA ;  Surgeon: Pryor Ochoa, MD;  Location: [facility] OR;  Service: Vascular;  Laterality: Left;  . BIOPSY  08/07/2017   Procedure: BIOPSY;  Surgeon: Sherrilyn Rist, MD;  Location: [facility];  Service: Gastroenterology;;  . CAPD REMOVAL N/A 05/19/2016   Procedure: PD CATH REMOVAL;  Surgeon: Abigail Miyamoto, MD;  Location: [facility] OR;  Service: General;  Laterality: N/A;  . CARDIAC CATHETERIZATION N/A 09/22/2015   Procedure: Left Heart Cath and Coronary Angiography;  Surgeon: Iran Ouch, MD;  Location: [facility] INVASIVE CV LAB;  Service: Cardiovascular;  Laterality: N/A;  . CORONARY ARTERY BYPASS GRAFT N/A 09/28/2015   Procedure: CORONARY ARTERY BYPASS GRAFTING (CABG) x 3 UTILIZING LEFT MAMMARY ARTERY AND ENDOSCOPICALLY HARVESTED LEFT GREATER SAPHENOUS VEIN.;  Surgeon: Delight Ovens, MD;  Location: [facility] OR;  Service: Open Heart Surgery;  Laterality: N/A;  .  ENDOVEIN HARVEST OF GREATER SAPHENOUS VEIN Left 09/28/2015   Procedure: ENDOVEIN HARVEST OF GREATER SAPHENOUS VEIN;  Surgeon: Delight Ovens, MD;  Location: [facility] OR;  Service: Open Heart Surgery;  Laterality: Left;  . ESOPHAGOGASTRODUODENOSCOPY N/A 03/29/2014   Procedure: ESOPHAGOGASTRODUODENOSCOPY (EGD);  Surgeon: Dorena Cookey, MD;  Location: [facility];  Service: Endoscopy;  Laterality: N/A;  .  ESOPHAGOGASTRODUODENOSCOPY (EGD) WITH PROPOFOL N/A 08/07/2017   Procedure: ESOPHAGOGASTRODUODENOSCOPY (EGD) WITH PROPOFOL;  Surgeon: Sherrilyn Rist, MD;  Location: [facility] ENDOSCOPY;  Service: Gastroenterology;  Laterality: N/A;  . EYE SURGERY    . FRACTURE SURGERY    . INSERTION OF DIALYSIS CATHETER Right 01/05/2015   Procedure: INSERTION OF RIGHT INTERNAL JUGULAR DIALYSIS CATHETER;  Surgeon: Fransisco Hertz, MD;  Location: [facility] OR;  Service: Vascular;  Laterality: Right;  . IR IMAGING GUIDED PORT INSERTION  06/19/2017  . LEFT HEART CATH AND CORS/GRAFTS ANGIOGRAPHY N/A 09/13/2016   Procedure: LEFT HEART CATH AND CORS/GRAFTS ANGIOGRAPHY;  Surgeon: Kathleene Hazel, MD;  Location: [facility] INVASIVE CV LAB;  Service: Cardiovascular;  Laterality: N/A;  . RETINAL LASER PROCEDURE Bilateral   . RIGHT/LEFT HEART CATH AND CORONARY/GRAFT ANGIOGRAPHY N/A 03/06/2017   Procedure: RIGHT/LEFT HEART CATH AND CORONARY/GRAFT ANGIOGRAPHY;  Surgeon: Marykay Lex, MD;  Location: [facility] INVASIVE CV LAB;  Service: Cardiovascular;  Laterality: N/A;  . TEE WITHOUT CARDIOVERSION N/A 09/28/2015   Procedure: TRANSESOPHAGEAL ECHOCARDIOGRAM (TEE);  Surgeon: Delight Ovens, MD;  Location: [facility] OR;  Service: Open Heart Surgery;  Laterality: N/A;  . TONSILLECTOMY AND ADENOIDECTOMY  1970s     reports that he has never smoked. He has never used smokeless tobacco. He reports that he drinks alcohol. He reports that he does not use drugs.  Allergies  Allergen Reactions  . Nsaids Other (See Comments)    Told to avoid all nsaids due to kidney disease   . Tape Other (See Comments)    Welts result, if left for a long amount of time    Family History  Problem Relation Age of Onset  . Obesity Mother        Patient states that family members have no other medical illnesses other than what I have described  . Kidney cancer Maternal Grandmother   . Cancer Father 50       AML  . Heart disease Unknown        No family history    Prior to  Admission medications   Medication Sig Start Date End Date Taking? Authorizing Provider  acetaminophen (TYLENOL) 325 MG tablet Take 1-2 tablets (325-650 mg total) by mouth every 4 (four) hours as needed for mild pain. 06/01/17  Yes Love, Evlyn Kanner, PA-C  amLODipine (NORVASC) 10 MG tablet Take 1 tablet (10 mg total) by mouth daily. 09/25/17 10/26/18 Yes Jodelle Gross, NP  amoxicillin-clavulanate (AUGMENTIN) 500-125 MG tablet Take 1 tablet by mouth 2 (two) times daily with a meal. 10/29/17  Yes [provider]  aspirin EC 81 MG EC tablet Take 1 tablet (81 mg total) by mouth daily. 10/06/15  Yes Barrett, Erin R, PA-C  atorvastatin (LIPITOR) 80 MG tablet Take 1 tablet (80 mg total) by mouth daily at 6 PM. 01/05/17  Yes Azalee Course, PA  calcitRIOL (ROCALTROL) 0.25 MCG capsule Take 5 capsules (1.25 mcg total) by mouth every Monday, Wednesday, and Friday with hemodialysis. 06/01/17  Yes Love, Evlyn Kanner, PA-C  calcium acetate (PHOSLO) 667 MG capsule Take 2,001 mg by mouth 3 (three) times daily with meals.  Yes [provider]  DULoxetine (CYMBALTA) 30 MG capsule Take 1 capsule (30 mg total) by mouth daily. 06/12/17  Yes [name], MD  HYDROcodone-acetaminophen (NORCO/VICODIN) 5-325 MG tablet Take 1 tablet by mouth every 12 (twelve) hours as needed for severe pain. Patient taking differently: Take 1 tablet by mouth every 6 (six) hours as needed for severe pain.  06/01/17  Yes Love, Evlyn Kanner, PA-C  insulin aspart (NOVOLOG FLEXPEN) 100 UNIT/ML FlexPen Inject 8-20 Units into the skin 3 (three) times daily with meals. Patient taking differently: Inject 5-10 Units into the skin 3 (three) times daily with meals. Per sliding scale 04/23/17  Yes Carlus Pavlov, MD  levothyroxine (SYNTHROID, LEVOTHROID) 75 MCG tablet Take 1 tablet (75 mcg total) by mouth daily before breakfast. 07/26/17  Yes Carlus Pavlov, MD  metoCLOPramide (REGLAN) 5 MG tablet Take 2 tablets (10 mg total) by mouth 2 (two) times  daily. 09/08/17  Yes Standley Brooking, MD  metoprolol tartrate (LOPRESSOR) 50 MG tablet Take 1 tablet (50 mg total) by mouth 2 (two) times daily. 11/01/17  Yes Jodelle Gross, NP  mycophenolate (CELLCEPT) 250 MG capsule Take 1 capsule (250 mg total) by mouth 2 (two) times daily. 10/30/17  Yes York Spaniel, MD  pregabalin (LYRICA) 75 MG capsule Take 1 capsule (75 mg total) by mouth daily. 06/12/17  Yes [name], MD  PRIVIGEN 40 GM/400ML SOLN Inject every 21 Days. 10/31/17  Yes [provider]  albuterol (PROVENTIL HFA;VENTOLIN HFA) 108 (90 Base) MCG/ACT inhaler Inhale 1 puff into the lungs every 6 (six) hours as needed for wheezing or shortness of breath. Patient taking differently: Inhale 2 puffs into the lungs every 8 (eight) hours as needed for wheezing or shortness of breath.  03/11/15   [name], MD  cyclobenzaprine (FLEXERIL) 10 MG tablet Take 10 mg by mouth 2 (two) times daily as needed for muscle spasms.    [provider]  Insulin Glargine (BASAGLAR KWIKPEN) 100 UNIT/ML SOPN Inject 0.3 mLs (30 Units total) into the skin 2 (two) times daily. 09/08/17   Standley Brooking, MD  Insulin Pen Needle (PEN NEEDLES) 32G X 4 MM MISC 1 Units by Does not apply route 6 (six) times daily. As needed and as directed by physician 06/12/17   [name], MD    Physical Exam: Vitals:   11/07/17 2100 11/07/17 2103 11/07/17 2200 11/07/17 2343  BP: 139/77 139/77 (!) 164/88 (!) 172/102  Pulse:  (!) 107 (!) 50 100  Resp: 17 18 20 18   Temp:    98.3 F (36.8 C)  TempSrc:    Oral  SpO2:  98% 100% 90%  Weight:      Height:          Constitutional: Moderately built and nourished. Vitals:   11/07/17 2100 11/07/17 2103 11/07/17 2200 11/07/17 2343  BP: 139/77 139/77 (!) 164/88 (!) 172/102  Pulse:  (!) 107 (!) 50 100  Resp: 17 18 20 18   Temp:    98.3 F (36.8 C)  TempSrc:    Oral  SpO2:  98% 100% 90%  Weight:      Height:       Eyes: Anicteric no pallor. ENMT: No  discharge from the ears eyes nose or mouth. Neck: No mass felt.  No neck rigidity but no JVD appreciated. Respiratory: No rhonchi or crepitations. Cardiovascular: S1-S2 heard. Abdomen: Soft nontender bowel sounds present. Musculoskeletal: Swelling and erythema of the right leg below the knee  with some wound seen. Skin: Right anterior shin of the leg looks erythematous a wound seen. Neurologic: Alert awake oriented to time place and person.  Moves all extremities. Psychiatric: Appears normal per normal affect.   Labs on Admission: I have personally reviewed following labs and imaging studies  CBC: Recent Labs  Lab 11/07/17 1843  WBC 6.0  NEUTROABS 4.7  HGB 11.9*  HCT 37.6*  MCV 93.1  PLT 176   Basic Metabolic Panel: Recent Labs  Lab 11/07/17 1843  NA 136  K 3.8  CL 93*  CO2 28  GLUCOSE 268*  BUN 28*  CREATININE 5.57*  CALCIUM 8.9   GFR: Estimated Creatinine Clearance: 21.6 mL/min (A) (by C-G formula based on SCr of 5.57 mg/dL (H)). Liver Function Tests: Recent Labs  Lab 11/07/17 1843  AST 18  ALT 15  ALKPHOS 77  BILITOT 1.3*  PROT 7.7  ALBUMIN 4.0   No results for input(s): LIPASE, AMYLASE in the last 168 hours. No results for input(s): AMMONIA in the last 168 hours. Coagulation Profile: No results for input(s): INR, PROTIME in the last 168 hours. Cardiac Enzymes: No results for input(s): CKTOTAL, CKMB, CKMBINDEX, TROPONINI in the last 168 hours. BNP (last 3 results) No results for input(s): PROBNP in the last 8760 hours. HbA1C: No results for input(s): HGBA1C in the last 72 hours. CBG: No results for input(s): GLUCAP in the last 168 hours. Lipid Profile: No results for input(s): CHOL, HDL, LDLCALC, TRIG, CHOLHDL, LDLDIRECT in the last 72 hours. Thyroid Function Tests: No results for input(s): TSH, T4TOTAL, FREET4, T3FREE, THYROIDAB in the last 72 hours. Anemia Panel: No results for input(s): VITAMINB12, FOLATE, FERRITIN, TIBC, IRON, RETICCTPCT in the  last 72 hours. Urine analysis:    Component Value Date/Time   COLORURINE YELLOW 05/18/2016 2110   APPEARANCEUR HAZY (A) 05/18/2016 2110   LABSPEC 1.018 05/18/2016 2110   PHURINE 7.0 05/18/2016 2110   GLUCOSEU >=500 (A) 05/18/2016 2110   HGBUR NEGATIVE 05/18/2016 2110   BILIRUBINUR NEGATIVE 05/18/2016 2110   BILIRUBINUR neg 05/26/2014 1956   KETONESUR NEGATIVE 05/18/2016 2110   PROTEINUR >=300 (A) 05/18/2016 2110   UROBILINOGEN 0.2 09/30/2014 1730   NITRITE NEGATIVE 05/18/2016 2110   LEUKOCYTESUR NEGATIVE 05/18/2016 2110   Sepsis Labs: @LABRCNTIP (procalcitonin:4,lacticidven:4) )No results found for this or any previous visit (from the past 240 hour(s)).   Radiological Exams on Admission: Dg Chest 2 View  Result Date: 11/07/2017 CLINICAL DATA:  Asthma.  Cough.  Diabetic. EXAM: CHEST - 2 VIEW COMPARISON:  09/05/2017 FINDINGS: Both views are degraded by patient size and technique. Midline trachea. Cardiomegaly accentuated by AP portable technique. No pleural effusion or pneumothorax. Pulmonary interstitial prominence is moderate, greater on the left. Is accentuated by low lung volumes. Prior median sternotomy. IMPRESSION: Cardiomegaly with pulmonary interstitial prominence, suspicious for pulmonary venous congestion. No overt congestive failure. Electronically Signed   By: Jeronimo Greaves M.D.   On: 11/07/2017 20:23   Ct Tibia Fibula Right W Contrast  Result Date: 11/07/2017 CLINICAL DATA:  RIGHT lower extremity puncture wound 2 weeks ago. 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 EXAM: CT OF THE LOWER RIGHT EXTREMITY WITH CONTRAST TECHNIQUE: Multidetector CT imaging of the lower right extremity (lower femur through hindfoot) was performed according to the standard protocol following intravenous contrast administration. COMPARISON:  RIGHT tibia and fibular radiographs August 29, 2016 CONTRAST:  ISOVUE-300 IOPAMIDOL (ISOVUE-300) INJECTION 61% FINDINGS:  Bones/Joint/Cartilage No fracture or dislocation. No destructive bony lesions. Mild knee  capsular calcification. Joint spaces maintained. 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 Moderate talar navicular osteoarthrosis. Ligaments Suboptimally assessed by CT. Muscles and Tendons Not suspicious. Soft tissues Minimal skin irregularity mid to distal anterior tibia, possible laceration. Generalized subcutaneous edema and trace effusion without focal fluid collection, subcutaneous gas or radiopaque foreign bodies. Severe vascular calcifications and extensive varicosities. Medial proximal tibial vascular clips. IMPRESSION: 1. Lower extremity interstitial edema and/or cellulitis. No subcutaneous gas or abscess. No CT findings of osteomyelitis. 2. Extensive varicosities, potential thrombophlebitis. 3. Advanced degenerative change of the ankle without fracture or dislocation. 4. Severe vascular calcifications. Electronically Signed   By: Awilda Metro M.D.   On: 11/07/2017 20:17    EKG: Independently reviewed.  Normal sinus rhythm with LVH QTC of 497 ms.  Assessment/Plan Active Problems:   Hypothyroidism (acquired)   Uncontrolled diabetes mellitus type 2 with peripheral artery disease (HCC)   Coronary artery disease involving native coronary artery of native heart without angina pectoris   S/P CABG (coronary artery bypass graft)   HTN (hypertension), malignant   Quadriparesis (HCC)   CIDP (chronic inflammatory demyelinating polyneuropathy) (HCC)   Gastroparesis due to DM (HCC)   LBBB (left bundle branch block)   Cellulitis   Cellulitis of right lower extremity   Elevated troponin    1. Right lower extremity cellulitis -CT scan does not show any definite collection of fluid.  Patient states he does have discharge.  Will need consult orthopedics in the morning.  Patient did have a recent ABI done on November 01, 2017 which showed normal  toe brachial index of both lower extremities.  Follow cultures.  No definite signs of any compartment syndrome. 2. Hypertension uncontrolled -we will keep patient on home medications including Norvasc metoprolol and PRN IV hydralazine. 3. Hypoxia likely from mild fluid overload with history of ESRD and systolic heart failure last EF measured was 45 to 50% with grade 2 diastolic dysfunction in September 2019.  Fluid management per nephrology. 4. Mild elevation of troponin with history of CAD status post CABG denies any chest pain though mildly fluid overloaded.  Patient on aspirin metoprolol statins.  We will cycle cardiac markers. 5. Diabetes mellitus type 2 on Lantus insulin 30 units twice daily which was confirmed with patient.  Closely follow CBGs with sliding scale coverage. 6. CIDP on CellCept and IVIG.  Patient is due for his IVIG on Friday number 08/21/2017. 7. Nausea vomiting likely from gastroparesis and also received pain medication.  Abdomen appears benign.  Closely observe.  On Reglan. 8. 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 9. Hypothyroidism on Synthroid.   DVT prophylaxis: We will keep patient on SCDs for now in anticipation of possible procedure. Code Status: Full code. Family Communication: Discussed with patient. Disposition Plan: Home. Consults called: None. Admission status: Inpatient.   Eduard Clos MD [facility] Pager [phone].  If 7PM-7AM, please contact night-coverage [email or web address] Password [email or web address]  11/07/2017, 11:49 PM

## 2017-11-07 NOTE — ED Triage Notes (Signed)
Pt states approx 2 weeks ago, he hit his right shin into a trailer hitch. Pt states that he went to his PCP, who gave him 2 weeks worth of abx. Pt states that his leg still "has a hole in it". Pt also states that his PCP stated he needed to come here and have a "pus spot" surgically removed. Pt ambulatory.

## 2017-11-07 NOTE — Patient Instructions (Addendum)
   Go to [facility] ER for further evaluation - I am concerned that the wound has caused an infection that could go to the bone if not fully treated and drained immediately and you have failed oral antibiotics doxycycline and Augmentin to likely need IV antibiotics.  If you have lab work done today you will be contacted with your lab results within the next 2 weeks.  If you have not heard from [place] then please contact us. The fastest way to get your results is to register for My Chart.   IF you received an x-ray today, you will receive an invoice from [facility]. Please contact [facility] at [phone] with questions or concerns regarding your invoice.   IF you received labwork today, you will receive an invoice from [place] [facility]. Please contact [facility] at [phone] with questions or concerns regarding your invoice.   Our billing staff will not be able to assist you with questions regarding bills from these companies.  You will be contacted with the lab results as soon as they are available. The fastest way to get your results is to activate your My Chart account. Instructions are located on the last page of this paperwork. If you have not heard from [place] regarding the results in 2 weeks, please contact this office.

## 2017-11-08 ENCOUNTER — Inpatient Hospital Stay (HOSPITAL_COMMUNITY): Payer: Medicare Other

## 2017-11-08 DIAGNOSIS — G6181 Chronic inflammatory demyelinating polyneuritis: Secondary | ICD-10-CM

## 2017-11-08 DIAGNOSIS — K3184 Gastroparesis: Secondary | ICD-10-CM

## 2017-11-08 DIAGNOSIS — L03115 Cellulitis of right lower limb: Principal | ICD-10-CM

## 2017-11-08 DIAGNOSIS — E1143 Type 2 diabetes mellitus with diabetic autonomic (poly)neuropathy: Secondary | ICD-10-CM

## 2017-11-08 LAB — CBC
HCT: 36.5 % — ABNORMAL LOW (ref 39.0–52.0)
Hemoglobin: 11.1 g/dL — ABNORMAL LOW (ref 13.0–17.0)
MCH: 28.2 pg (ref 26.0–34.0)
MCHC: 30.4 g/dL (ref 30.0–36.0)
MCV: 92.9 fL (ref 80.0–100.0)
PLATELETS: 175 10*3/uL (ref 150–400)
RBC: 3.93 MIL/uL — ABNORMAL LOW (ref 4.22–5.81)
RDW: 15.8 % — AB (ref 11.5–15.5)
WBC: 6.4 10*3/uL (ref 4.0–10.5)
nRBC: 0 % (ref 0.0–0.2)

## 2017-11-08 LAB — HEPATIC FUNCTION PANEL
ALT: 14 U/L (ref 0–44)
AST: 17 U/L (ref 15–41)
Albumin: 3.2 g/dL — ABNORMAL LOW (ref 3.5–5.0)
Alkaline Phosphatase: 70 U/L (ref 38–126)
BILIRUBIN DIRECT: 0.3 mg/dL — AB (ref 0.0–0.2)
BILIRUBIN TOTAL: 1.1 mg/dL (ref 0.3–1.2)
Indirect Bilirubin: 0.8 mg/dL (ref 0.3–0.9)
Total Protein: 6.8 g/dL (ref 6.5–8.1)

## 2017-11-08 LAB — BASIC METABOLIC PANEL
Anion gap: 13 (ref 5–15)
BUN: 30 mg/dL — AB (ref 6–20)
CALCIUM: 8.9 mg/dL (ref 8.9–10.3)
CHLORIDE: 94 mmol/L — AB (ref 98–111)
CO2: 28 mmol/L (ref 22–32)
CREATININE: 6.12 mg/dL — AB (ref 0.61–1.24)
GFR, EST AFRICAN AMERICAN: 11 mL/min — AB (ref >60)
GFR, EST NON AFRICAN AMERICAN: 10 mL/min — AB (ref >60)
Glucose, Bld: 310 mg/dL — ABNORMAL HIGH (ref 70–99)
Potassium: 4.3 mmol/L (ref 3.5–5.1)
SODIUM: 135 mmol/L (ref 135–145)

## 2017-11-08 LAB — GLUCOSE, CAPILLARY
GLUCOSE-CAPILLARY: 267 mg/dL — AB (ref 70–99)
GLUCOSE-CAPILLARY: 141 mg/dL — AB (ref 70–99)
Glucose-Capillary: 255 mg/dL — ABNORMAL HIGH (ref 70–99)
Glucose-Capillary: 297 mg/dL — ABNORMAL HIGH (ref 70–99)
Glucose-Capillary: 326 mg/dL — ABNORMAL HIGH (ref 70–99)

## 2017-11-08 LAB — MAGNESIUM: MAGNESIUM: 2.1 mg/dL (ref 1.7–2.4)

## 2017-11-08 LAB — LIPASE, BLOOD: Lipase: 29 U/L (ref 11–51)

## 2017-11-08 LAB — TROPONIN I
TROPONIN I: 0.09 ng/mL — AB (ref <0.03)
TROPONIN I: 0.07 ng/mL — AB (ref <0.03)
Troponin I: 0.09 ng/mL (ref <0.03)

## 2017-11-08 MED ORDER — LORAZEPAM 0.5 MG PO TABS
0.2500 mg | ORAL_TABLET | Freq: Once | ORAL | Status: AC
  Administered 2017-11-08: 0.25 mg via ORAL
  Filled 2017-11-08: qty 1

## 2017-11-08 MED ORDER — ONDANSETRON HCL 4 MG/2ML IJ SOLN
4.0000 mg | Freq: Four times a day (QID) | INTRAMUSCULAR | Status: DC | PRN
  Administered 2017-11-08 – 2017-11-10 (×4): 4 mg via INTRAVENOUS
  Filled 2017-11-08 (×3): qty 2

## 2017-11-08 MED ORDER — VANCOMYCIN HCL IN DEXTROSE 1-5 GM/200ML-% IV SOLN
1000.0000 mg | INTRAVENOUS | Status: DC
  Administered 2017-11-09: 1000 mg via INTRAVENOUS
  Filled 2017-11-08: qty 200

## 2017-11-08 MED ORDER — HYDRALAZINE HCL 20 MG/ML IJ SOLN
5.0000 mg | INTRAMUSCULAR | Status: DC | PRN
  Administered 2017-11-08 – 2017-11-10 (×3): 5 mg via INTRAVENOUS
  Filled 2017-11-08 (×3): qty 1

## 2017-11-08 MED ORDER — CHLORHEXIDINE GLUCONATE CLOTH 2 % EX PADS
6.0000 | MEDICATED_PAD | Freq: Every day | CUTANEOUS | Status: DC
  Administered 2017-11-09 – 2017-11-10 (×2): 6 via TOPICAL

## 2017-11-08 MED ORDER — SODIUM CHLORIDE 0.9% FLUSH
10.0000 mL | INTRAVENOUS | Status: DC | PRN
  Administered 2017-11-08 – 2017-11-11 (×2): 10 mL
  Filled 2017-11-08 (×2): qty 40

## 2017-11-08 MED ORDER — HEPARIN SODIUM (PORCINE) 5000 UNIT/ML IJ SOLN
5000.0000 [IU] | Freq: Three times a day (TID) | INTRAMUSCULAR | Status: DC
  Filled 2017-11-08 (×3): qty 1

## 2017-11-08 MED ORDER — RENA-VITE PO TABS
1.0000 | ORAL_TABLET | Freq: Every day | ORAL | Status: DC
  Filled 2017-11-08 (×2): qty 1

## 2017-11-08 MED ORDER — METOCLOPRAMIDE HCL 5 MG/ML IJ SOLN
10.0000 mg | Freq: Three times a day (TID) | INTRAMUSCULAR | Status: DC
  Administered 2017-11-08 – 2017-11-11 (×10): 10 mg via INTRAVENOUS
  Filled 2017-11-08 (×10): qty 2

## 2017-11-08 MED ORDER — INSULIN GLARGINE 100 UNIT/ML ~~LOC~~ SOLN: 30.0000 [IU] | Freq: Two times a day (BID) | SUBCUTANEOUS | Status: DC

## 2017-11-08 NOTE — Progress Notes (Signed)
Notified by CCMD about pt having frequent bigeminy PVCs. Pt in no apparent distress. MD made aware. Will continue to monitor.

## 2017-11-08 NOTE — Progress Notes (Signed)
Inpatient Diabetes Program Recommendations  AACE/ADA: New Consensus Statement on Inpatient Glycemic Control (2015)  Target Ranges:  Prepandial:   less than 140 mg/dL      Peak postprandial:   less than 180 mg/dL (1-2 hours)      Critically ill patients:  140 - 180 mg/dL   Lab Results  Component Value Date   GLUCAP 297 (H) 11/08/2017   HGBA1C 9.1 (A) 07/26/2017    Review of Glycemic Control Results for Russell [name], Russell [name]" (MRN [id number]) as of 11/08/2017 11:23  Ref. Range 11/08/2017 00:38 11/08/2017 09:34  Glucose-Capillary Latest Ref Range: 70 - 99 mg/dL 782 (H) 956 (H)   Diabetes history: Type 2 DM Outpatient Diabetes medications: Novolog 5-10 units TID, Lantus 30 units BID Current orders for Inpatient glycemic control: Lantus 30 units BID, Novolog 0-9 units TID  Inpatient Diabetes Program Recommendations:    Last A1C from July, consider repeating A1C?  In the setting of infection, consider increasing Lantus to 34 units BID and adding Novolog 5 units TID (assuming that patient is consuming >50% of meal).  Thanks, [name], MSN, RNC-OB Diabetes Coordinator [phone] (8a-5p)

## 2017-11-08 NOTE — Consult Note (Addendum)
[facility] Renal Consultation Note    Indication for Consultation:  Management of ESRD/hemodialysis; anemia, hypertension/volume and secondary hyperparathyroidism PCP: Norberto Sorenson, MD  HPI: Russell [name] is a 47 y.o. 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  He received his full dialysis yesterday and was transferred from [facility] ED to [facility] for admission due to the need for dialysis. He reports N and V after arrival but not before, no fevers, but sweats, some SOB, no CP.  He makes small amount of urine. He has chronic LE and hand numbness and tingling.  Evaluation in the ED included CT of RLE with evidence of cellulitis no abscess. Following cultures, he was started on empiric Vanc and Rocephin. CXR showed CM with pulmonary interstitial prominence suspicious for pulmonary venous congestion.  Chemistries were appropriate for being post HD- WBC 6- normal diff glu 268  hgb 11.9 He was afebrile. He will be due for dialysis tomorrow.  Past Medical History:  Diagnosis Date  . Anxiety   . Asthma   . Bilateral foot-drop 10/30/2017  . 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 Daily headache   . Depression   . DVT (deep venous thrombosis), H/o 01/2014-on Xarelto 03/24/2014   LLE  . ESRD (end stage renal disease) on dialysis [facility])    "[place], MWF" (06/29/2016)  . Gait abnormality 12/22/2016  . Gastroparesis 12/22/2016  . Hypertension   . Hypothyroidism   . LBBB (left bundle branch block) 09/06/2017  . Nephrotic syndrome 05/18/2014  . Neuropathy   . Type 2  diabetes mellitus with diabetic nephropathy [facility])    Past Surgical History:  Procedure Laterality Date  . ANKLE FRACTURE SURGERY Right 1988  . AV FISTULA PLACEMENT Left 01/01/2015   Procedure: CREATION OF LEFT RADIAL CEPHALIC ARTERIOVENOUS (AV) FISTULA ;  Surgeon: Pryor Ochoa, MD;  Location: [facility] OR;  Service: Vascular;  Laterality: Left;  . BIOPSY  08/07/2017   Procedure: BIOPSY;  Surgeon: Sherrilyn Rist, MD;  Location: [facility];  Service: Gastroenterology;;  . CAPD REMOVAL N/A 05/19/2016   Procedure: PD CATH REMOVAL;  Surgeon: Abigail Miyamoto, MD;  Location: [facility] OR;  Service: General;  Laterality: N/A;  . CARDIAC CATHETERIZATION N/A 09/22/2015   Procedure: Left Heart Cath and Coronary Angiography;  Surgeon: Iran Ouch, MD;  Location: [facility] INVASIVE CV LAB;  Service: Cardiovascular;  Laterality: N/A;  . CORONARY ARTERY BYPASS GRAFT N/A 09/28/2015   Procedure: CORONARY ARTERY BYPASS GRAFTING (CABG) x 3 UTILIZING LEFT MAMMARY ARTERY AND ENDOSCOPICALLY HARVESTED LEFT GREATER SAPHENOUS VEIN.;  Surgeon: Delight Ovens, MD;  Location: [facility] OR;  Service: Open Heart Surgery;  Laterality: N/A;  . ENDOVEIN HARVEST OF GREATER SAPHENOUS VEIN Left 09/28/2015   Procedure: ENDOVEIN HARVEST OF GREATER SAPHENOUS VEIN;  Surgeon: Delight Ovens, MD;  Location: [facility] OR;  Service: Open Heart Surgery;  Laterality: Left;  . ESOPHAGOGASTRODUODENOSCOPY N/A 03/29/2014   Procedure: ESOPHAGOGASTRODUODENOSCOPY (EGD);  Surgeon: Dorena Cookey, MD;  Location: [facility];  Service: Endoscopy;  Laterality: N/A;  . ESOPHAGOGASTRODUODENOSCOPY (EGD) WITH PROPOFOL N/A 08/07/2017   Procedure: ESOPHAGOGASTRODUODENOSCOPY (EGD) WITH PROPOFOL;  Surgeon: Sherrilyn Rist, MD;  Location: [facility];  Service: Gastroenterology;  Laterality: N/A;  . EYE SURGERY    . FRACTURE SURGERY    . INSERTION OF DIALYSIS CATHETER Right 01/05/2015   Procedure: INSERTION OF RIGHT INTERNAL JUGULAR DIALYSIS CATHETER;  Surgeon: Fransisco Hertz, MD;   Location: [facility] OR;  Service: Vascular;  Laterality: Right;  . IR IMAGING GUIDED PORT INSERTION  06/19/2017  . LEFT HEART CATH AND CORS/GRAFTS ANGIOGRAPHY N/A 09/13/2016   Procedure: LEFT HEART CATH AND CORS/GRAFTS ANGIOGRAPHY;  Surgeon: Kathleene Hazel, MD;  Location: [facility] INVASIVE CV LAB;  Service: Cardiovascular;  Laterality: N/A;  . RETINAL LASER PROCEDURE Bilateral   . RIGHT/LEFT HEART CATH AND CORONARY/GRAFT ANGIOGRAPHY N/A 03/06/2017   Procedure: RIGHT/LEFT HEART CATH AND CORONARY/GRAFT ANGIOGRAPHY;  Surgeon: Marykay Lex, MD;  Location: [facility] INVASIVE CV LAB;  Service: Cardiovascular;  Laterality: N/A;  . TEE WITHOUT CARDIOVERSION N/A 09/28/2015   Procedure: TRANSESOPHAGEAL ECHOCARDIOGRAM (TEE);  Surgeon: Delight Ovens, MD;  Location: [facility] OR;  Service: Open Heart Surgery;  Laterality: N/A;  . TONSILLECTOMY AND ADENOIDECTOMY  1970s   Family History  Problem Relation Age of Onset  . Obesity Mother        Patient states that family members have no other medical illnesses other than what I have described  . Kidney cancer Maternal Grandmother   . Cancer Father 50       AML  . Heart disease Unknown        No family history   Social History:  reports that he has never smoked. He has never used smokeless tobacco. He reports that he drinks alcohol. He reports that he does not use drugs. Allergies  Allergen Reactions  . Nsaids Other (See Comments)    Told to avoid all nsaids due to kidney disease   . Tape Other (See Comments)    Welts result, if left for a long amount of time   Prior to Admission medications   Medication Sig Start Date End Date Taking? Authorizing Provider  acetaminophen (TYLENOL) 325 MG tablet Take 1-2 tablets (325-650 mg total) by mouth every 4 (four) hours as needed for mild pain. 06/01/17  Yes Love, Evlyn Kanner, PA-C  amLODipine (NORVASC) 10 MG tablet Take 1 tablet (10 mg total) by mouth daily. 09/25/17 10/26/18 Yes Jodelle Gross, NP  amoxicillin-clavulanate  (AUGMENTIN) 500-125 MG tablet Take 1 tablet by mouth 2 (two) times daily with a meal. 10/29/17  Yes [provider]  aspirin EC 81 MG EC tablet Take 1 tablet (81 mg total) by mouth daily. 10/06/15  Yes Barrett, Erin R, PA-C  atorvastatin (LIPITOR) 80 MG tablet Take 1 tablet (80 mg total) by mouth daily at 6 PM. 01/05/17  Yes Azalee Course, PA  calcitRIOL (ROCALTROL) 0.25 MCG capsule Take 5 capsules (1.25 mcg total) by mouth every Monday, Wednesday, and Friday with hemodialysis. 06/01/17  Yes Love, Evlyn Kanner, PA-C  calcium acetate (PHOSLO) 667 MG capsule Take 2,001 mg by mouth 3 (three) times daily with meals.   Yes [provider]  DULoxetine (CYMBALTA) 30 MG capsule Take 1 capsule (30 mg total) by mouth daily. 06/12/17  Yes [name], MD  HYDROcodone-acetaminophen (NORCO/VICODIN) 5-325 MG tablet Take 1 tablet by mouth every 12 (twelve) hours as needed for severe pain. Patient taking differently: Take 1 tablet by mouth every 6 (six) hours as needed for severe pain.  06/01/17  Yes Love, Evlyn Kanner, PA-C  insulin aspart (NOVOLOG FLEXPEN)  100 UNIT/ML FlexPen Inject 8-20 Units into the skin 3 (three) times daily with meals. Patient taking differently: Inject 5-10 Units into the skin 3 (three) times daily with meals. Per sliding scale 04/23/17  Yes Carlus Pavlov, MD  levothyroxine (SYNTHROID, LEVOTHROID) 75 MCG tablet Take 1 tablet (75 mcg total) by mouth daily before breakfast. 07/26/17  Yes Carlus Pavlov, MD  metoCLOPramide (REGLAN) 5 MG tablet Take 2 tablets (10 mg total) by mouth 2 (two) times daily. 09/08/17  Yes Standley Brooking, MD  metoprolol tartrate (LOPRESSOR) 50 MG tablet Take 1 tablet (50 mg total) by mouth 2 (two) times daily. 11/01/17  Yes Jodelle Gross, NP  mycophenolate (CELLCEPT) 250 MG capsule Take 1 capsule (250 mg total) by mouth 2 (two) times daily. 10/30/17  Yes York Spaniel, MD  pregabalin (LYRICA) 75 MG capsule Take 1 capsule (75 mg total) by mouth daily.  06/12/17  Yes [name], MD  PRIVIGEN 40 GM/400ML SOLN Inject every 21 Days. 10/31/17  Yes [provider]  albuterol (PROVENTIL HFA;VENTOLIN HFA) 108 (90 Base) MCG/ACT inhaler Inhale 1 puff into the lungs every 6 (six) hours as needed for wheezing or shortness of breath. Patient taking differently: Inhale 2 puffs into the lungs every 8 (eight) hours as needed for wheezing or shortness of breath.  03/11/15   [name], MD  cyclobenzaprine (FLEXERIL) 10 MG tablet Take 10 mg by mouth 2 (two) times daily as needed for muscle spasms.    [provider]  Insulin Glargine (BASAGLAR KWIKPEN) 100 UNIT/ML SOPN Inject 0.3 mLs (30 Units total) into the skin 2 (two) times daily. 09/08/17   Standley Brooking, MD  Insulin Pen Needle (PEN NEEDLES) 32G X 4 MM MISC 1 Units by Does not apply route 6 (six) times daily. As needed and as directed by physician 06/12/17   [name], MD   Current Facility-Administered Medications  Medication Dose Route Frequency Provider Last Rate Last Dose  . acetaminophen (TYLENOL) tablet 650 mg  650 mg Oral Q6H PRN Eduard Clos, MD       Or  . acetaminophen (TYLENOL) suppository 650 mg  650 mg Rectal Q6H PRN Eduard Clos, MD      . albuterol (PROVENTIL) (2.5 MG/3ML) 0.083% nebulizer solution 2.5 mg  2.5 mg Inhalation Q8H PRN Eduard Clos, MD      . amLODipine (NORVASC) tablet 10 mg  10 mg Oral Daily Eduard Clos, MD   10 mg at 11/08/17 1610  . aspirin EC tablet 81 mg  81 mg Oral Daily Eduard Clos, MD   81 mg at 11/08/17 9604  . atorvastatin (LIPITOR) tablet 80 mg  80 mg Oral q1800 Eduard Clos, MD      . Melene Muller ON 11/09/2017] calcitRIOL (ROCALTROL) capsule 1.25 mcg  1.25 mcg Oral Q M,W,F-HD Eduard Clos, MD      . calcium acetate (PHOSLO) capsule 2,001 mg  2,001 mg Oral TID WC Eduard Clos, MD   2,001 mg at 11/08/17 0951  . cefTRIAXone (ROCEPHIN) 2 g in sodium chloride 0.9 % 100 mL IVPB  2 g  Intravenous Q24H Eduard Clos, MD   Stopped at 11/07/17 2202  . cyclobenzaprine (FLEXERIL) tablet 10 mg  10 mg Oral BID PRN Eduard Clos, MD      . DULoxetine (CYMBALTA) DR capsule 30 mg  30 mg Oral Daily Eduard Clos, MD   30 mg at 11/08/17 5409  .  hydrALAZINE (APRESOLINE) injection 5 mg  5 mg Intravenous Q4H PRN Eduard Clos, MD      . HYDROcodone-acetaminophen (NORCO/VICODIN) 5-325 MG per tablet 1 tablet  1 tablet Oral Q6H PRN Eduard Clos, MD      . insulin aspart (novoLOG) injection 0-9 Units  0-9 Units Subcutaneous TID WC Eduard Clos, MD   5 Units at 11/08/17 [phone]  . insulin glargine (LANTUS) injection 30 Units  30 Units Subcutaneous BID Eduard Clos, MD   30 Units at 11/08/17 [phone]  . levothyroxine (SYNTHROID, LEVOTHROID) tablet 75 mcg  75 mcg Oral QAC breakfast Eduard Clos, MD   75 mcg at 11/08/17 6644  . metoCLOPramide (REGLAN) injection 10 mg  10 mg Intravenous Q8H Elgergawy, Leana Roe, MD   10 mg at 11/08/17 0952  . metoprolol tartrate (LOPRESSOR) tablet 50 mg  50 mg Oral BID Eduard Clos, MD   50 mg at 11/08/17 0951  . mycophenolate (CELLCEPT) capsule 250 mg  250 mg Oral BID Eduard Clos, MD   250 mg at 11/08/17 0951  . ondansetron (ZOFRAN) injection 4 mg  4 mg Intravenous Q6H PRN Elgergawy, Leana Roe, MD      . pregabalin (LYRICA) capsule 75 mg  75 mg Oral Daily Eduard Clos, MD   75 mg at 11/08/17 0952  . sodium chloride flush (NS) 0.9 % injection 10-40 mL  10-40 mL Intracatheter PRN Eduard Clos, MD      . Melene Muller ON 11/09/2017] vancomycin (VANCOCIN) IVPB 1000 mg/200 mL premix  1,000 mg Intravenous Q M,W,F-HD [facility]       Labs: Basic Metabolic Panel: Recent Labs  Lab 11/07/17 1843 11/08/17 0126  NA 136 135  K 3.8 4.3  CL 93* 94*  CO2 28 28  GLUCOSE 268* 310*  BUN 28* 30*  CREATININE 5.57* 6.12*  CALCIUM 8.9 8.9   Liver Function Tests: Recent Labs  Lab  11/07/17 1843 11/08/17 0126  AST 18 17  ALT 15 14  ALKPHOS 77 70  BILITOT 1.3* 1.1  PROT 7.7 6.8  ALBUMIN 4.0 3.2*   Recent Labs  Lab 11/08/17 0126  LIPASE 29   No results for input(s): AMMONIA in the last 168 hours. CBC: Recent Labs  Lab 11/07/17 1843 11/08/17 0449  WBC 6.0 6.4  NEUTROABS 4.7  --   HGB 11.9* 11.1*  HCT 37.6* 36.5*  MCV 93.1 92.9  PLT 176 175   Cardiac Enzymes: Recent Labs  Lab 11/08/17 0126 11/08/17 0449  TROPONINI 0.09* 0.09*   CBG: Recent Labs  Lab 11/08/17 0038 11/08/17 0934  GLUCAP 255* 297*   Iron Studies: No results for input(s): IRON, TIBC, TRANSFERRIN, FERRITIN in the last 72 hours. Studies/Results: Dg Chest 2 View  Result Date: 11/07/2017 CLINICAL DATA:  Asthma.  Cough.  Diabetic. EXAM: CHEST - 2 VIEW COMPARISON:  09/05/2017 FINDINGS: Both views are degraded by patient size and technique. Midline trachea. Cardiomegaly accentuated by AP portable technique. No pleural effusion or pneumothorax. Pulmonary interstitial prominence is moderate, greater on the left. Is accentuated by low lung volumes. Prior median sternotomy. IMPRESSION: Cardiomegaly with pulmonary interstitial prominence, suspicious for pulmonary venous congestion. No overt congestive failure. Electronically Signed   By: Jeronimo Greaves M.D.   On: 11/07/2017 20:23   Dg Abd 1 View  Result Date: 11/08/2017 CLINICAL DATA:  Nausea and vomiting EXAM: ABDOMEN - 1 VIEW COMPARISON:  09/05/2017 abdominal CT FINDINGS: Normal bowel gas pattern. No abnormal stool  retention. No concerning mass effect or calcification. No significant osseous finding. IMPRESSION: Normal bowel gas pattern. Electronically Signed   By: Marnee Spring M.D.   On: 11/08/2017 08:32   Ct Tibia Fibula Right W Contrast  Result Date: 11/07/2017 CLINICAL DATA:  RIGHT lower extremity puncture wound 2 weeks ago. 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 EXAM: CT OF THE LOWER RIGHT EXTREMITY WITH  CONTRAST TECHNIQUE: Multidetector CT imaging of the lower right extremity (lower femur through hindfoot) was performed according to the standard protocol following intravenous contrast administration. COMPARISON:  RIGHT tibia and fibular radiographs August 29, 2016 CONTRAST:  ISOVUE-300 IOPAMIDOL (ISOVUE-300) INJECTION 61% FINDINGS: Bones/Joint/Cartilage No fracture or dislocation. No destructive bony lesions. Mild knee capsular calcification. Joint spaces maintained. 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 Moderate talar navicular osteoarthrosis. Ligaments Suboptimally assessed by CT. Muscles and Tendons Not suspicious. Soft tissues Minimal skin irregularity mid to distal anterior tibia, possible laceration. Generalized subcutaneous edema and trace effusion without focal fluid collection, subcutaneous gas or radiopaque foreign bodies. Severe vascular calcifications and extensive varicosities. Medial proximal tibial vascular clips. IMPRESSION: 1. Lower extremity interstitial edema and/or cellulitis. No subcutaneous gas or abscess. No CT findings of osteomyelitis. 2. Extensive varicosities, potential thrombophlebitis. 3. Advanced degenerative change of the ankle without fracture or dislocation. 4. Severe vascular calcifications. Electronically Signed   By: Awilda Metro M.D.   On: 11/07/2017 20:17    ROS: As per HPI otherwise negative.  Physical Exam: Vitals:   11/07/17 2200 11/07/17 2343 11/08/17 0423 11/08/17 0533  BP: (!) 164/88 (!) 172/102 136/84 (!) 167/97  Pulse: (!) 50 100  78  Resp: 20 18  18   Temp:  98.3 F (36.8 C)  (!) 97.3 F (36.3 C)  TempSrc:  Oral  Oral  SpO2: 100% 90%  91%  Weight:  119.6 kg  119.2 kg  Height:  6\' 2"  (1.88 m)       General: WDWN NAD breathing easily room air Head: NCAT sclera not icteric MMM Neck: Supple.  Lungs: CTA bilaterally Chest: right port for IV access Heart: RRR with S1 S2.   Abdomen: obese soft NT + BS Lower extremities: woody - tr LE edema, righ anterior lower shin wound with signifcant erythema/tender also healing wound on anterior ankle Neuro: A & O  X 3. Moves all extremities spontaneously. Psych:  Responds to questions appropriately with a normal affect. Dialysis Access: left lower AVF + bruit  Dialysis Orders:   [facility] MWF 4.5 hr   119kg  2/2.25 bath  Hep 4000  LFA AVF Mircera 60 mcg q 2wks - last given 11/6 51% sat hgb 10.6  Calcitriol 1.63mcg po/HD iPTH 288 P elevated Parsabiv 5 mg q hd No venofer  Assessment/Plan: 1. right LE cellulitis related to prior wound- IV Vanc/Rocephin started  2. Nausea and vomiting - hx of gastroparesis - meds per primary  3. ESRD -  MWF - HD tomorrow 4. Hypertension/volume  - gets to EDW - net UF outpt Wed was 4 - I think EDW can probably go down a little- on amlodipine and MTP 5. Anemia  - hgb good - no ESA - last got 60 Mircera 11/6 6. Metabolic bone disease -  Continue calcitriol- - parsabiv not available here; continue binders 7.  Nutrition - rena carb mod/vits 8.  DM - perprimary 9. CM/hx CABG- on BB and asa/statin - trop flat 10. Anxiety/epression - current meds 11. CIDP -on  cellcept and monthly IVIG  [name], PA-C [facility] Beeper [phone] 11/08/2017, 12:46 PM     I have seen and examined this patient and agree with plan and assessment in the above note with renal recommendations/intervention highlighted.  I do not believe that his ulcer on his right shin is related to calciphylaxis as it is well circumscribed and symmetrical and related to traumatic injury.  He may have some small vessel disease as well, given his h/o ASCVD and CABG.  He is at an increased risk for developing calciphylaxis given his poorly controlled hyperphosphatemia and stressed compliance with diet and binders.  Jomarie Longs A Priscilla Finklea,MD 11/08/2017 3:52 PM

## 2017-11-08 NOTE — ED Notes (Signed)
Pt was given Fentanyl 25 mcg IV on 11/07/2017 at 2243----- Fentanyl 75 mcg wasted at this time with Sharene Skeans RN.

## 2017-11-08 NOTE — Progress Notes (Signed)
Troponin lab result: 0.09 Pt continues to deny chest pain. MD made aware. Will continue to monitor.

## 2017-11-08 NOTE — Progress Notes (Signed)
PROGRESS NOTE                                                                                                                                                                                                             Patient Demographics:    Russell [name], is a 47 y.o. male, DOB - 01/10/1970, ZOX:[id number]  Admit date - 11/07/2017   Admitting Physician Russell Clos, MD  Outpatient Primary MD for the patient is Russell Mocha, MD  LOS - 1   Chief Complaint  Patient presents with  . Leg Injury       Brief Narrative    47 y.o. male with history of diabetes mellitus type 2 on insulin, ESRD on hemodialysis Monday Wednesday and Friday, CIDP, CAD status post CABG, hypertension, hypothyroidism, gastroparesis hurt his leg 2 weeks ago on his right anterior shin and since then has been having increasing swelling erythema and had gone to his PCP and was prescribed antibiotics which he has been taking for almost 2 weeks despite which patient is erythema has worsened with increasing discharge from the wound and pain.   Subjective:    Russell [name] today for significant nausea, and dry heaving, no vomiting as he reports he had no oral intake yet .   Assessment  & Plan :    Active Problems:   Hypothyroidism (acquired)   Uncontrolled diabetes mellitus type 2 with peripheral artery disease (HCC)   Coronary artery disease involving native coronary artery of native heart without angina pectoris   S/P CABG (coronary artery bypass graft)   HTN (hypertension), malignant   Quadriparesis (HCC)   CIDP (chronic inflammatory demyelinating polyneuropathy) (HCC)   Gastroparesis due to DM (HCC)   LBBB (left bundle branch block)   Cellulitis   Cellulitis of right lower extremity   Elevated troponin  Right lower extremity cellulitis  -CT scan does not show any definite collection of fluid.  Patient states he does have discharge.   Patient did have a recent ABI  done on November 01, 2017 which showed normal toe brachial index of both lower extremities.  -For now continue with IV vancomycin and Rocephin, follow on blood cultures and narrow antibiotic in the next 24 hours if remain negative.  Hypertension  -Slightly elevated, new home medication include metoprolol, Norvasc, as well added PRN hydralazine  Hypoxia -  likely from mild fluid overload with history of ESRD and systolic heart failure last EF measured was 45 to 50% with grade 2 diastolic dysfunction in September 2019.  Fluid management per nephrology.  Elevated troponin  -Patient with known history of CAD, status post CABG, but currently he denies any chest pain  -Troponins are chronically elevated, most likely in ESRD, non-ACS pattern  Diabetes mellitus type 2  - on Lantus insulin 30 units twice daily which was confirmed with patient.    Continue to monitor closely patient in the setting of poor oral intake  CIDP  - on CellCept and IVIG.  Patient is due for his IVIG on Friday number 08/21/2017.  Nausea and vomiting  -Abdominal exam is benign, denies any abdominal pain, KUB was obtained which was negative, this is most likely in the setting of his gastroparesis, I have started him on Reglan, and PRN Zofran, and with poor oral intake .  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  Hypothyroidism on Synthroid.    Code Status : Full code  Family Communication  : None at bedside  Disposition Plan  : Home when stable  Consults  :  Renal  Procedures  : none  DVT Prophylaxis  :  Greer heparin  Lab Results  Component Value Date   PLT 175 11/08/2017    Antibiotics  :    Anti-infectives (From admission, onward)   Start     Dose/Rate Route Frequency Ordered Stop   11/09/17 1200  vancomycin (VANCOCIN) IVPB 1000 mg/200 mL premix     1,000 mg 200 mL/hr over 60 Minutes Intravenous Every M-W-F (Hemodialysis) 11/08/17 0011      11/07/17 1830  vancomycin (VANCOCIN) 2,000 mg in sodium chloride 0.9 % 500 mL IVPB  Status:  Discontinued     2,000 mg 250 mL/hr over 120 Minutes Intravenous  Once 11/07/17 1813 11/07/17 1822   11/07/17 1830  vancomycin (VANCOCIN) 2,500 mg in sodium chloride 0.9 % 500 mL IVPB     2,500 mg 250 mL/hr over 120 Minutes Intravenous  Once 11/07/17 1822 11/07/17 2202   11/07/17 1815  cefTRIAXone (ROCEPHIN) 2 g in sodium chloride 0.9 % 100 mL IVPB     2 g 200 mL/hr over 30 Minutes Intravenous Every 24 hours 11/07/17 1809          Objective:   Vitals:   11/07/17 2200 11/07/17 2343 11/08/17 0423 11/08/17 0533  BP: (!) 164/88 (!) 172/102 136/84 (!) 167/97  Pulse: (!) 50 100  78  Resp: 20 18  18   Temp:  98.3 F (36.8 C)  (!) 97.3 F (36.3 C)  TempSrc:  Oral  Oral  SpO2: 100% 90%  91%  Weight:  119.6 kg  119.2 kg  Height:  6\' 2"  (1.88 m)      Wt Readings from Last 3 Encounters:  11/08/17 119.2 kg  11/07/17 118.8 kg  11/01/17 121.1 kg     Intake/Output Summary (Last 24 hours) at 11/08/2017 1409 Last data filed at 11/07/2017 2202 Gross per 24 hour  Intake 600 ml  Output -  Net 600 ml     Physical Exam  Awake Alert, Oriented X 3, No new F.N deficits, to be in mild discomfort secondary to nausead.  Symmetrical Chest wall movement, Good air movement bilaterally, CTAB RRR,No Gallops,Rubs or new Murmurs, No Parasternal Heave +ve B.Sounds, Abd Soft, No tenderness, No rebound - guarding or rigidity. No  Cyanosis, Clubbing or edema, right lower extremity anterior chain with wound, with surrounding erythema    Data Review:    CBC Recent Labs  Lab 11/07/17 1843 11/08/17 0449  WBC 6.0 6.4  HGB 11.9* 11.1*  HCT 37.6* 36.5*  PLT 176 175  MCV 93.1 92.9  MCH 29.5 28.2  MCHC 31.6 30.4  RDW 15.7* 15.8*  LYMPHSABS 0.4*  --   MONOABS 0.7  --   EOSABS 0.1  --   BASOSABS 0.0  --     Chemistries  Recent Labs  Lab 11/07/17 1843 11/08/17 0126  NA 136 135  K 3.8 4.3  CL 93* 94*   CO2 28 28  GLUCOSE 268* 310*  BUN 28* 30*  CREATININE 5.57* 6.12*  CALCIUM 8.9 8.9  MG  --  2.1  AST 18 17  ALT 15 14  ALKPHOS 77 70  BILITOT 1.3* 1.1   ------------------------------------------------------------------------------------------------------------------ No results for input(s): CHOL, HDL, LDLCALC, TRIG, CHOLHDL, LDLDIRECT in the last 72 hours.  Lab Results  Component Value Date   HGBA1C 9.1 (A) 07/26/2017   ------------------------------------------------------------------------------------------------------------------ No results for input(s): TSH, T4TOTAL, T3FREE, THYROIDAB in the last 72 hours.  Invalid input(s): FREET3 ------------------------------------------------------------------------------------------------------------------ No results for input(s): VITAMINB12, FOLATE, FERRITIN, TIBC, IRON, RETICCTPCT in the last 72 hours.  Coagulation profile No results for input(s): INR, PROTIME in the last 168 hours.  No results for input(s): DDIMER in the last 72 hours.  Cardiac Enzymes Recent Labs  Lab 11/08/17 0126 11/08/17 0449  TROPONINI 0.09* 0.09*   ------------------------------------------------------------------------------------------------------------------    Component Value Date/Time   BNP 2,339.0 (H) 11/07/2017 1843   BNP 41.7 05/05/2014 0904    Inpatient Medications  Scheduled Meds: . amLODipine  10 mg Oral Daily  . aspirin EC  81 mg Oral Daily  . atorvastatin  80 mg Oral q1800  . [START ON 11/09/2017] calcitRIOL  1.25 mcg Oral Q M,W,F-HD  . calcium acetate  2,001 mg Oral TID WC  . [START ON 11/09/2017] Chlorhexidine Gluconate Cloth  6 each Topical Q0600  . DULoxetine  30 mg Oral Daily  . insulin aspart  0-9 Units Subcutaneous TID WC  . insulin glargine  30 Units Subcutaneous BID  . levothyroxine  75 mcg Oral QAC breakfast  . metoCLOPramide (REGLAN) injection  10 mg Intravenous Q8H  . metoprolol tartrate  50 mg Oral BID  .  multivitamin  1 tablet Oral QHS  . mycophenolate  250 mg Oral BID  . pregabalin  75 mg Oral Daily   Continuous Infusions: . cefTRIAXone (ROCEPHIN)  IV Stopped (11/07/17 2202)  . [START ON 11/09/2017] vancomycin     PRN Meds:.acetaminophen **OR** acetaminophen, albuterol, cyclobenzaprine, hydrALAZINE, HYDROcodone-acetaminophen, ondansetron (ZOFRAN) IV, sodium chloride flush  Micro Results Recent Results (from the past 240 hour(s))  Blood Culture (routine x 2)     Status: None (Preliminary result)   Collection Time: 11/07/17  6:43 PM  Result Value Ref Range Status   Specimen Description   Final    BLOOD PORTA CATH Performed at [facility], [address], [place], [place] [address]    Special Requests   Final    BOTTLES DRAWN AEROBIC AND ANAEROBIC Blood Culture adequate volume Performed at [facility], [address], [place], [place] [address]    Culture   Final    NO GROWTH < 24 HOURS Performed at [facility], [address], [place], [place] [address]    Report Status PENDING  Incomplete  Blood Culture (routine x 2)     Status: None (Preliminary result)   Collection Time: 11/07/17  7:24 PM  Result Value Ref Range Status   Specimen Description   Final    BLOOD RIGHT HAND Performed at [facility], [address], [place], [place] [address]    Special Requests   Final    BOTTLES DRAWN AEROBIC AND ANAEROBIC Blood Culture adequate volume Performed at [facility], [address], [place], [place] [address]    Culture   Final    NO GROWTH < 24 HOURS Performed at [facility], [address], [place], [place] [address]    Report Status PENDING  Incomplete    Radiology Reports Dg Chest 2 View  Result Date: 11/07/2017 CLINICAL DATA:  Asthma.  Cough.  Diabetic. EXAM: CHEST - 2 VIEW COMPARISON:  09/05/2017 FINDINGS: Both views are degraded by patient size and technique. Midline trachea.  Cardiomegaly accentuated by AP portable technique. No pleural effusion or pneumothorax. Pulmonary interstitial prominence is moderate, greater on the left. Is accentuated by low lung volumes. Prior median sternotomy. IMPRESSION: Cardiomegaly with pulmonary interstitial prominence, suspicious for pulmonary venous congestion. No overt congestive failure. Electronically Signed   By: Jeronimo Greaves M.D.   On: 11/07/2017 20:23   Dg Abd 1 View  Result Date: 11/08/2017 CLINICAL DATA:  Nausea and vomiting EXAM: ABDOMEN - 1 VIEW COMPARISON:  09/05/2017 abdominal CT FINDINGS: Normal bowel gas pattern. No abnormal stool retention. No concerning mass effect or calcification. No significant osseous finding. IMPRESSION: Normal bowel gas pattern. Electronically Signed   By: Marnee Spring M.D.   On: 11/08/2017 08:32   Ct Tibia Fibula Right W Contrast  Result Date: 11/07/2017 CLINICAL DATA:  RIGHT lower extremity puncture wound 2 weeks ago. 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 EXAM: CT OF THE LOWER RIGHT EXTREMITY WITH CONTRAST TECHNIQUE: Multidetector CT imaging of the lower right extremity (lower femur through hindfoot) was performed according to the standard protocol following intravenous contrast administration. COMPARISON:  RIGHT tibia and fibular radiographs August 29, 2016 CONTRAST:  ISOVUE-300 IOPAMIDOL (ISOVUE-300) INJECTION 61% FINDINGS: Bones/Joint/Cartilage No fracture or dislocation. No destructive bony lesions. Mild knee capsular calcification. Joint spaces maintained. 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 Moderate talar navicular osteoarthrosis. Ligaments Suboptimally assessed by CT. Muscles and Tendons Not suspicious. Soft tissues Minimal skin irregularity mid to distal anterior tibia, possible laceration. Generalized subcutaneous edema and trace effusion without focal fluid collection, subcutaneous gas  or radiopaque foreign bodies. Severe vascular calcifications and extensive varicosities. Medial proximal tibial vascular clips. IMPRESSION: 1. Lower extremity interstitial edema and/or cellulitis. No subcutaneous gas or abscess. No CT findings of osteomyelitis. 2. Extensive varicosities, potential thrombophlebitis. 3. Advanced degenerative change of the ankle without fracture or dislocation. 4. Severe vascular calcifications. Electronically Signed   By: Awilda Metro M.D.   On: 11/07/2017 20:17   Vas [place] Vanice Sarah With/wo Tbi  Result Date: 11/01/2017 A LOWER EXTREMITY DOPPLER STUDY Indications: Patient presents today with complaints of worsening painful              numbness, stabbing and thrombing sensations to both feet. He denies              any claudication symptoms. He states he takes Lyrica daily that              helps with his symptoms. If he walks too much during  the day by the              time he gets home to rest he begins to feel the numbness, thrombing              and stabbing sensations in his feet. Symptoms are worse at night. High Risk Factors: Hypertension, hyperlipidemia, Diabetes, no history of                    smoking, coronary artery disease.  Vascular Interventions: CABG on 09/28/2015. Performing Technologist: Tyna Jaksch RVT  Examination Guidelines: A complete evaluation includes at minimum, Doppler waveform signals and systolic blood pressure reading at the level of bilateral brachial, anterior tibial, and posterior tibial arteries, when vessel segments are accessible. Bilateral testing is considered an integral part of a complete examination. Photoelectric Plethysmograph (PPG) waveforms and toe systolic pressure readings are included as required and additional duplex testing as needed. Limited examinations for reoccurring indications may be performed as noted.  ABI Findings: +---------+------------------+-----+---------+---------------------------------+ Right    Rt Pressure  (mmHg)IndexWaveform Comment                           +---------+------------------+-----+---------+---------------------------------+ Brachial 155                                                               +---------+------------------+-----+---------+---------------------------------+ ATA      254               1.64 triphasic                                  +---------+------------------+-----+---------+---------------------------------+ PTA      254               1.64 triphasic                                  +---------+------------------+-----+---------+---------------------------------+ PERO                                     inaudible (difficulty innosating                                           possibly due to hardening of                                               skin)                             +---------+------------------+-----+---------+---------------------------------+ Great Toe190               1.23 Normal                                     +---------+------------------+-----+---------+---------------------------------+ +---------+------------------+-----+---------+---------------------------------+  Left     Lt Pressure (mmHg)IndexWaveform Comment                           +---------+------------------+-----+---------+---------------------------------+ Brachial AVF                                                               +---------+------------------+-----+---------+---------------------------------+ ATA      254               1.64 triphasic                                  +---------+------------------+-----+---------+---------------------------------+ PTA      254               1.64 triphasic                                  +---------+------------------+-----+---------+---------------------------------+ PERO                                     inaudible (difficulty innosating                                            possibly due to hardening of                                               skin)                             +---------+------------------+-----+---------+---------------------------------+ Great Toe200               1.29 Normal                                     +---------+------------------+-----+---------+---------------------------------+ +-------+-----------+-----------+------------+------------+ ABI/TBIToday's ABIToday's TBIPrevious ABIPrevious TBI +-------+-----------+-----------+------------+------------+ Right  [name]         1.23       [place]          [place]           +-------+-----------+-----------+------------+------------+ Left   [place]         1.29       [place]          [place]           +-------+-----------+-----------+------------+------------+ Right ABIs appear essentially unchanged compared to prior study on 11/16/2016. Left ABIs appear essentially unchanged compared to prior study on 11/16/2016.  Summary: Right: Resting right ankle-brachial index indicates noncompressible right lower extremity arteries.The right toe-brachial index is normal. Left: Resting left ankle-brachial index indicates noncompressible left lower extremity arteries.The left toe-brachial index is normal.  *See table(s) above for measurements and observations.     Preliminary  A LOWER EXTREMITY DOPPLER STUDY Indications: Patient presents  today with complaints of worsening painful              numbness, stabbing and throbbing sensations to both feet. He denies              any claudication symptoms. He states he takes Lyrica daily that              helps with his symptoms. If he walks too much during the day by the              time he gets home to rest he begins to feel the numbness, throbbing              and stabbing sensations in his feet. Symptoms are worse at night. High Risk Factors: Hypertension, hyperlipidemia, Diabetes, no history of                    smoking, coronary artery disease.  Vascular  Interventions: CABG on 09/28/2015. Performing Technologist: Tyna Jaksch RVT  Examination Guidelines: A complete evaluation includes at minimum, Doppler waveform signals and systolic blood pressure reading at the level of bilateral brachial, anterior tibial, and posterior tibial arteries, when vessel segments are accessible. Bilateral testing is considered an integral part of a complete examination. Photoelectric Plethysmograph (PPG) waveforms and toe systolic pressure readings are included as required and additional duplex testing as needed. Limited examinations for reoccurring indications may be performed as noted.  ABI Findings: +---------+------------------+-----+---------+---------------------------------+ Right    Rt Pressure (mmHg)IndexWaveform Comment                           +---------+------------------+-----+---------+---------------------------------+ Brachial 155                                                               +---------+------------------+-----+---------+---------------------------------+ ATA      254               1.64 triphasic                                  +---------+------------------+-----+---------+---------------------------------+ PTA      254               1.64 triphasic                                  +---------+------------------+-----+---------+---------------------------------+ PERO                                     inaudible (difficulty insonating                                           possibly due to hardening of                                               skin)                             +---------+------------------+-----+---------+---------------------------------+  Great Toe190               1.23 Normal                                     +---------+------------------+-----+---------+---------------------------------+ +---------+------------------+-----+---------+---------------------------------+ Left      Lt Pressure (mmHg)IndexWaveform Comment                           +---------+------------------+-----+---------+---------------------------------+ Brachial AVF                                                               +---------+------------------+-----+---------+---------------------------------+ ATA      254               1.64 triphasic                                  +---------+------------------+-----+---------+---------------------------------+ PTA      254               1.64 triphasic                                  +---------+------------------+-----+---------+---------------------------------+ PERO                                     inaudible (difficulty insonating                                           possibly due to hardening of                                               skin)                             +---------+------------------+-----+---------+---------------------------------+ Great Toe200               1.29 Normal                                     +---------+------------------+-----+---------+---------------------------------+ +-------+-----------+-----------+------------+------------+ ABI/TBIToday's ABIToday's TBIPrevious ABIPrevious TBI +-------+-----------+-----------+------------+------------+ Right  [place]         1.23       [place]          [place]           +-------+-----------+-----------+------------+------------+ Left   [place]         1.29       [place]          [place]           +-------+-----------+-----------+------------+------------+ Right ABIs appear essentially unchanged compared to prior study on 11/16/2016. Left ABIs appear essentially unchanged compared to prior study on 11/16/2016.  Summary: Right: Resting right ankle-brachial index indicates noncompressible right lower extremity arteries. The right toe-brachial index is normal. Left: Resting left ankle-brachial index indicates noncompressible left lower extremity arteries.  The left toe-brachial index is normal.  *See table(s) above for measurements and observations.     Preliminary  A LOWER EXTREMITY DOPPLER STUDY Indications: In 11/2016, an arterial Doppler performed at Cardiovascular at              [facility] showed an ABI of non comppressible vessels,              bilaterally. Patient presents today with complaints of worsening              painful numbness, stabbing and throbbing sensations to both feet.              He denies any claudication symptoms. He states he takes Lyrica              daily that helps with his symptoms. If he walks too much during the              day by the time he gets home to rest he begins to feel the              numbness, throbbing and stabbing sensations in his feet. Symptoms              are worse at night. High Risk Factors: Hypertension, hyperlipidemia, Diabetes, no history of                    smoking, coronary artery disease.  Vascular Interventions: CABG on 09/28/2015. Performing Technologist: Tyna Jaksch RVT  Examination Guidelines: A complete evaluation includes at minimum, Doppler waveform signals and systolic blood pressure reading at the level of bilateral brachial, anterior tibial, and posterior tibial arteries, when vessel segments are accessible. Bilateral testing is considered an integral part of a complete examination. Photoelectric Plethysmograph (PPG) waveforms and toe systolic pressure readings are included as required and additional duplex testing as needed. Limited examinations for reoccurring indications may be performed as noted.  ABI Findings: +---------+------------------+-----+---------+---------------------------------+ Right    Rt Pressure (mmHg)IndexWaveform Comment                           +---------+------------------+-----+---------+---------------------------------+ Brachial 155                                                                +---------+------------------+-----+---------+---------------------------------+ ATA      254               1.64 triphasic                                  +---------+------------------+-----+---------+---------------------------------+ PTA      254               1.64 triphasic                                  +---------+------------------+-----+---------+---------------------------------+ PERO  inaudible (difficulty insonating                                           possibly due to hardening of                                               skin)                             +---------+------------------+-----+---------+---------------------------------+ Great Toe190               1.23 Normal                                     +---------+------------------+-----+---------+---------------------------------+ +---------+------------------+-----+---------+---------------------------------+ Left     Lt Pressure (mmHg)IndexWaveform Comment                           +---------+------------------+-----+---------+---------------------------------+ Brachial AVF                                                               +---------+------------------+-----+---------+---------------------------------+ ATA      254               1.64 triphasic                                  +---------+------------------+-----+---------+---------------------------------+ PTA      254               1.64 triphasic                                  +---------+------------------+-----+---------+---------------------------------+ PERO                                     inaudible (difficulty insonating                                           possibly due to hardening of                                               skin)                             +---------+------------------+-----+---------+---------------------------------+  Great Toe200               1.29 Normal                                     +---------+------------------+-----+---------+---------------------------------+ +-------+-----------+-----------+------------+------------+  ABI/TBIToday's ABIToday's TBIPrevious ABIPrevious TBI +-------+-----------+-----------+------------+------------+ Right  [place]         1.23       [place]          [place]           +-------+-----------+-----------+------------+------------+ Left   [place]         1.29       [place]          [place]           +-------+-----------+-----------+------------+------------+  Right ABIs appear essentially unchanged compared to prior study on 11/16/2016. Left ABIs appear essentially unchanged compared to prior study on 11/16/2016.  Summary: Right: Resting right ankle-brachial index indicates noncompressible right lower extremity arteries. The right toe-brachial index is normal. Left: Resting left ankle-brachial index indicates noncompressible left lower extremity arteries. The left toe-brachial index is normal.  *See table(s) above for measurements and observations.  Electronically signed by Julien Nordmann MD on 11/01/2017 at 9:30:41 PM.    Final       Huey Bienenstock M.D on 11/08/2017 at 2:09 PM  Between 7am to 7pm - Pager - [phone]  After 7pm go to [email or web address] - password [email or web address]  [facility] -  Office  [phone]

## 2017-11-09 DIAGNOSIS — I10 Essential (primary) hypertension: Secondary | ICD-10-CM

## 2017-11-09 LAB — RENAL FUNCTION PANEL
Albumin: 3.2 g/dL — ABNORMAL LOW (ref 3.5–5.0)
Anion gap: 14 (ref 5–15)
BUN: 42 mg/dL — ABNORMAL HIGH (ref 6–20)
CHLORIDE: 95 mmol/L — AB (ref 98–111)
CO2: 28 mmol/L (ref 22–32)
Calcium: 9.3 mg/dL (ref 8.9–10.3)
Creatinine, Ser: 8.1 mg/dL — ABNORMAL HIGH (ref 0.61–1.24)
GFR, EST AFRICAN AMERICAN: 8 mL/min — AB (ref >60)
GFR, EST NON AFRICAN AMERICAN: 7 mL/min — AB (ref >60)
Glucose, Bld: 73 mg/dL (ref 70–99)
POTASSIUM: 3.7 mmol/L (ref 3.5–5.1)
Phosphorus: 7.2 mg/dL — ABNORMAL HIGH (ref 2.5–4.6)
Sodium: 137 mmol/L (ref 135–145)

## 2017-11-09 LAB — CBC
HEMATOCRIT: 35.1 % — AB (ref 39.0–52.0)
Hemoglobin: 10.7 g/dL — ABNORMAL LOW (ref 13.0–17.0)
MCH: 28.2 pg (ref 26.0–34.0)
MCHC: 30.5 g/dL (ref 30.0–36.0)
MCV: 92.4 fL (ref 80.0–100.0)
NRBC: 0 % (ref 0.0–0.2)
Platelets: 203 10*3/uL (ref 150–400)
RBC: 3.8 MIL/uL — AB (ref 4.22–5.81)
RDW: 16.1 % — AB (ref 11.5–15.5)
WBC: 9 10*3/uL (ref 4.0–10.5)

## 2017-11-09 LAB — GLUCOSE, CAPILLARY
GLUCOSE-CAPILLARY: 63 mg/dL — AB (ref 70–99)
GLUCOSE-CAPILLARY: 83 mg/dL (ref 70–99)
GLUCOSE-CAPILLARY: 91 mg/dL (ref 70–99)
GLUCOSE-CAPILLARY: 105 mg/dL — AB (ref 70–99)
Glucose-Capillary: 92 mg/dL (ref 70–99)

## 2017-11-09 LAB — HEMOGLOBIN A1C
HEMOGLOBIN A1C: 9.6 % — AB (ref 4.8–5.6)
MEAN PLASMA GLUCOSE: 228.82 mg/dL

## 2017-11-09 MED ORDER — SODIUM CHLORIDE 0.9 % IV SOLN: 100.0000 mL | INTRAVENOUS | Status: DC | PRN

## 2017-11-09 MED ORDER — PENTAFLUOROPROP-TETRAFLUOROETH EX AERO: 1.0000 "application " | INHALATION_SPRAY | CUTANEOUS | Status: DC | PRN

## 2017-11-09 MED ORDER — CALCITRIOL 0.25 MCG PO CAPS
ORAL_CAPSULE | ORAL | Status: AC
  Administered 2017-11-09: 1.25 ug via ORAL
  Filled 2017-11-09: qty 1

## 2017-11-09 MED ORDER — CALCITRIOL 0.5 MCG PO CAPS
ORAL_CAPSULE | ORAL | Status: AC
  Filled 2017-11-09: qty 2

## 2017-11-09 MED ORDER — PENTAFLUOROPROP-TETRAFLUOROETH EX AERO
INHALATION_SPRAY | CUTANEOUS | Status: AC
  Administered 2017-11-09: 11:00:00
  Filled 2017-11-09: qty 116

## 2017-11-09 MED ORDER — HEPARIN SODIUM (PORCINE) 1000 UNIT/ML DIALYSIS
20.0000 [IU]/kg | INTRAMUSCULAR | Status: DC | PRN
  Administered 2017-11-09: 2400 [IU] via INTRAVENOUS_CENTRAL
  Filled 2017-11-09 (×2): qty 3

## 2017-11-09 MED ORDER — INSULIN GLARGINE 100 UNIT/ML ~~LOC~~ SOLN
20.0000 [IU] | Freq: Two times a day (BID) | SUBCUTANEOUS | Status: DC
  Administered 2017-11-09: 20 [IU] via SUBCUTANEOUS
  Filled 2017-11-09 (×2): qty 0.2

## 2017-11-09 MED ORDER — HEPARIN SODIUM (PORCINE) 1000 UNIT/ML IJ SOLN
INTRAMUSCULAR | Status: AC
  Administered 2017-11-09: 2400 [IU] via INTRAVENOUS_CENTRAL
  Filled 2017-11-09: qty 3

## 2017-11-09 MED ORDER — VANCOMYCIN HCL IN DEXTROSE 1-5 GM/200ML-% IV SOLN
INTRAVENOUS | Status: AC
  Administered 2017-11-09: 1000 mg via INTRAVENOUS
  Filled 2017-11-09: qty 200

## 2017-11-09 MED FILL — Ondansetron HCl Inj 4 MG/2ML (2 MG/ML): INTRAMUSCULAR | Qty: 2 | Status: AC

## 2017-11-09 NOTE — Progress Notes (Signed)
[facility] Progress Note   Subjective: No new complaints. Seen on HD.   Objective Vitals:   11/09/17 0830 11/09/17 0900 11/09/17 0930 11/09/17 1000  BP: 139/78 (!) 153/91 (!) 154/85 119/64  Pulse: 82 89 79 78  Resp:      Temp:      TempSrc:      SpO2:      Weight:      Height:       Physical Exam General: Disheveled appearing male in NAD Heart:S1,S2 RRR Lungs: CTAB Abdomen: Obese, active BS Extremities: Trace pedal edema. Erythema noted R shin-feet discolored, several toes with eschar on tips of toes.  Dialysis Access: L AVF cannulated at present.     Additional Objective Labs: Basic Metabolic Panel: Recent Labs  Lab 11/07/17 1843 11/08/17 0126 11/09/17 0419  NA 136 135 137  K 3.8 4.3 3.7  CL 93* 94* 95*  CO2 28 28 28   GLUCOSE 268* 310* 73  BUN 28* 30* 42*  CREATININE 5.57* 6.12* 8.10*  CALCIUM 8.9 8.9 9.3  PHOS  --   --  7.2*   Liver Function Tests: Recent Labs  Lab 11/07/17 1843 11/08/17 0126 11/09/17 0419  AST 18 17  --   ALT 15 14  --   ALKPHOS 77 70  --   BILITOT 1.3* 1.1  --   PROT 7.7 6.8  --   ALBUMIN 4.0 3.2* 3.2*   Recent Labs  Lab 11/08/17 0126  LIPASE 29   CBC: Recent Labs  Lab 11/07/17 1843 11/08/17 0449 11/09/17 0419  WBC 6.0 6.4 9.0  NEUTROABS 4.7  --   --   HGB 11.9* 11.1* 10.7*  HCT 37.6* 36.5* 35.1*  MCV 93.1 92.9 92.4  PLT 176 175 203   Blood Culture    Component Value Date/Time   SDES  11/07/2017 1924    BLOOD RIGHT HAND Performed at [facility], [address] [place], [place] [address]    SPECREQUEST  11/07/2017 1924    BOTTLES DRAWN AEROBIC AND ANAEROBIC Blood Culture adequate volume Performed at [facility], [address] [place], [place] [address]    CULT  11/07/2017 1924    NO GROWTH < 24 HOURS Performed at [facility], [address], [place], [place] [address]    REPTSTATUS PENDING 11/07/2017 1924    Cardiac Enzymes: Recent Labs   Lab 11/08/17 0126 11/08/17 0449 11/08/17 1337  TROPONINI 0.09* 0.09* 0.07*   CBG: Recent Labs  Lab 11/08/17 1247 11/08/17 1740 11/08/17 2138 11/09/17 0651 11/09/17 1042  GLUCAP 326* 267* 141* 63* 83   Iron Studies: No results for input(s): IRON, TIBC, TRANSFERRIN, FERRITIN in the last 72 hours. @lablastinr3 @ Studies/Results: Dg Chest 2 View  Result Date: 11/07/2017 CLINICAL DATA:  Asthma.  Cough.  Diabetic. EXAM: CHEST - 2 VIEW COMPARISON:  09/05/2017 FINDINGS: Both views are degraded by patient size and technique. Midline trachea. Cardiomegaly accentuated by AP portable technique. No pleural effusion or pneumothorax. Pulmonary interstitial prominence is moderate, greater on the left. Is accentuated by low lung volumes. Prior median sternotomy. IMPRESSION: Cardiomegaly with pulmonary interstitial prominence, suspicious for pulmonary venous congestion. No overt congestive failure. Electronically Signed   By: Jeronimo Greaves M.D.   On: 11/07/2017 20:23   Dg Abd 1 View  Result Date: 11/08/2017 CLINICAL DATA:  Nausea and vomiting EXAM: ABDOMEN - 1 VIEW COMPARISON:  09/05/2017 abdominal CT FINDINGS: Normal bowel gas pattern. No abnormal stool retention. No concerning  mass effect or calcification. No significant osseous finding. IMPRESSION: Normal bowel gas pattern. Electronically Signed   By: Marnee Spring M.D.   On: 11/08/2017 08:32   Ct Tibia Fibula Right W Contrast  Result Date: 11/07/2017 CLINICAL DATA:  RIGHT lower extremity puncture wound 2 weeks ago. 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 EXAM: CT OF THE LOWER RIGHT EXTREMITY WITH CONTRAST TECHNIQUE: Multidetector CT imaging of the lower right extremity (lower femur through hindfoot) was performed according to the standard protocol following intravenous contrast administration. COMPARISON:  RIGHT tibia and fibular radiographs August 29, 2016 CONTRAST:  ISOVUE-300 IOPAMIDOL (ISOVUE-300) INJECTION 61%  FINDINGS: Bones/Joint/Cartilage No fracture or dislocation. No destructive bony lesions. Mild knee capsular calcification. Joint spaces maintained. 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 Moderate talar navicular osteoarthrosis. Ligaments Suboptimally assessed by CT. Muscles and Tendons Not suspicious. Soft tissues Minimal skin irregularity mid to distal anterior tibia, possible laceration. Generalized subcutaneous edema and trace effusion without focal fluid collection, subcutaneous gas or radiopaque foreign bodies. Severe vascular calcifications and extensive varicosities. Medial proximal tibial vascular clips. IMPRESSION: 1. Lower extremity interstitial edema and/or cellulitis. No subcutaneous gas or abscess. No CT findings of osteomyelitis. 2. Extensive varicosities, potential thrombophlebitis. 3. Advanced degenerative change of the ankle without fracture or dislocation. 4. Severe vascular calcifications. Electronically Signed   By: Awilda Metro M.D.   On: 11/07/2017 20:17   Medications: . sodium chloride    . sodium chloride    . cefTRIAXone (ROCEPHIN)  IV 2 g (11/08/17 1851)  . vancomycin 1,000 mg (11/09/17 1110)   . amLODipine  10 mg Oral Daily  . aspirin EC  81 mg Oral Daily  . atorvastatin  80 mg Oral q1800  . calcitRIOL  1.25 mcg Oral Q M,W,F-HD  . calcium acetate  2,001 mg Oral TID WC  . Chlorhexidine Gluconate Cloth  6 each Topical Q0600  . DULoxetine  30 mg Oral Daily  . heparin injection (subcutaneous)  5,000 Units Subcutaneous Q8H  . insulin aspart  0-9 Units Subcutaneous TID WC  . insulin glargine  30 Units Subcutaneous BID  . levothyroxine  75 mcg Oral QAC breakfast  . metoCLOPramide (REGLAN) injection  10 mg Intravenous Q8H  . metoprolol tartrate  50 mg Oral BID  . multivitamin  1 tablet Oral QHS  . mycophenolate  250 mg Oral BID  . pregabalin  75 mg Oral Daily   HD orders: [facility] MWF 4.5 hrs 180NRe  450/800 119 kg 2.0 K/2.25 Ca UFP 4 -Heparin 4000 units IV TIW -Parsabiv 5 mg IV TIW (Not on hospital formulary) -Calcitriol 1.25 mcg PO TIW -Mircera 60 mcg IV q 2 weeks (last dose 11/07/17)  Assessment/Plan: 1. right LE cellulitis related to prior wound- IV Vanc/Rocephin started  2. Nausea and vomiting - hx of gastroparesis - meds per primary  3. ESRD -  MWF - HD today on schedule  4. Hypertension/volume  - UFG 3.5 H/O high IDWG so this is improved. Usually gets to EDW as OP. BP variable. Continue home antihypertensive meds.  5. Anemia  - HGB10.7 - Recent OP ESA dose. Follow HGB.  6. Metabolic bone disease -  Continue calcitriol- - parsabiv not available here; continue binders 7.  Nutrition - rena carb mod/vits 8.  Uncontrolled DM - per primary 9. CM/hx CABG- on BB and asa/statin - trop flat 10. Anxiety/epression - current meds 11. 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   NP-C 11/09/2017, 11:21 AM  [employer] [phone]

## 2017-11-09 NOTE — Progress Notes (Signed)
1252: patient c/o nausea. + vomiting small amt. Administered prn zofran which was not effective at 2:38 at which time he asked for his scheduled reglan. Patient has refused all po medications this shift.

## 2017-11-09 NOTE — Progress Notes (Signed)
PROGRESS NOTE                                                                                                                                                                                                             Patient Demographics:    Russell [name], is a 47 y.o. male, DOB - [birth year], ZOX:[id number]  Admit date - 11/07/2017   Admitting Physician Eduard Clos, MD  Outpatient Primary MD for the patient is [name], MD  LOS - 2   Chief Complaint  Patient presents with  . Leg Injury       Brief Narrative    47 y.o. male with history of diabetes mellitus type 2 on insulin, ESRD on hemodialysis Monday Wednesday and Friday, CIDP, CAD status post CABG, hypertension, hypothyroidism, gastroparesis hurt his leg 2 weeks ago on his right anterior shin and since then has been having increasing swelling erythema and had gone to his PCP and was prescribed antibiotics which he has been taking for almost 2 weeks despite which patient is erythema has worsened with increasing discharge from the wound and pain.   Subjective:    Russell [name] today reports significant improvement in his nausea and vomiting, reports he can tolerate oral intake.   Assessment  & Plan :    Active Problems:   Hypothyroidism (acquired)   Uncontrolled diabetes mellitus type 2 with peripheral artery disease (HCC)   Coronary artery disease involving native coronary artery of native heart without angina pectoris   S/P CABG (coronary artery bypass graft)   HTN (hypertension), malignant   Quadriparesis (HCC)   CIDP (chronic inflammatory demyelinating polyneuropathy) (HCC)   Gastroparesis due to DM (HCC)   LBBB (left bundle branch block)   Cellulitis   Cellulitis of right lower extremity   Elevated troponin  Right lower extremity cellulitis  -CT scan does not show any definite collection of fluid.  Patient states he does have discharge.   Patient did have a recent ABI  done on November 01, 2017 which showed normal toe brachial index of both lower extremities.  -For now on IV vancomycin and Rocephin, no discharge appears to be today, afebrile, leukocytosis, will discontinue vancomycin and continue with Rocephin .  Hypertension  -Slightly elevated, on  home medication include metoprolol, Norvasc, as well added PRN hydralazine   Hypoxia -  likely from mild fluid overload with history of ESRD and systolic heart failure last EF measured was 45 to 50% with grade 2 diastolic dysfunction in September 2019.  Fluid management per nephrology.  Elevated troponin  -Patient with known history of CAD, status post CABG, but currently he denies any chest pain  -Troponins are chronically elevated, most likely in ESRD, non-ACS pattern  Diabetes mellitus type 2  - on Lantus insulin 30 units twice daily which was confirmed with patient.    He was hypoglycemic this morning, most likely due to his nausea and vomiting, have lowered his Lantus to 20 units twice daily, continue to follow.   CIDP  - on CellCept and IVIG.  Patient is due for his IVIG today, he discussed with his home care, they will reschedule for Monday.    Nausea and vomiting  -Abdominal exam is benign, denies any abdominal pain, KUB was obtained which was negative, this is most likely in the setting of his gastroparesis, he is significantly improved and scheduled Reglan, continue with PRN Zofran as well, advance to carb modified diet.  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  Hypothyroidism on Synthroid.    Code Status : Full code  Family Communication  : None at bedside  Disposition Plan  : Home when stable  Consults  :  Renal  Procedures  : none  DVT Prophylaxis  :  [place] heparin  Lab Results  Component Value Date   PLT 203 11/09/2017    Antibiotics  :    Anti-infectives (From admission, onward)   Start     Dose/Rate Route  Frequency Ordered Stop   11/09/17 1200  vancomycin (VANCOCIN) IVPB 1000 mg/200 mL premix     1,000 mg 200 mL/hr over 60 Minutes Intravenous Every M-W-F (Hemodialysis) 11/08/17 0011     11/07/17 1830  vancomycin (VANCOCIN) 2,000 mg in sodium chloride 0.9 % 500 mL IVPB  Status:  Discontinued     2,000 mg 250 mL/hr over 120 Minutes Intravenous  Once 11/07/17 1813 11/07/17 1822   11/07/17 1830  vancomycin (VANCOCIN) 2,500 mg in sodium chloride 0.9 % 500 mL IVPB     2,500 mg 250 mL/hr over 120 Minutes Intravenous  Once 11/07/17 1822 11/07/17 2202   11/07/17 1815  cefTRIAXone (ROCEPHIN) 2 g in sodium chloride 0.9 % 100 mL IVPB     2 g 200 mL/hr over 30 Minutes Intravenous Every 24 hours 11/07/17 1809          Objective:   Vitals:   11/09/17 1130 11/09/17 1200 11/09/17 1213 11/09/17 1359  BP: 134/81 (!) 151/87 (!) 154/88 (!) 182/103  Pulse: 88 88 87 93  Resp:   16 16  Temp:   (!) 97 F (36.1 C) 98.1 F (36.7 C)  TempSrc:   Oral Oral  SpO2:   93% 91%  Weight:  118.2 kg    Height:        Wt Readings from Last 3 Encounters:  11/09/17 118.2 kg  11/07/17 118.8 kg  11/01/17 121.1 kg     Intake/Output Summary (Last 24 hours) at 11/09/2017 1407 Last data filed at 11/09/2017 1200 Gross per 24 hour  Intake -  Output 2614 ml  Net -2614 ml     Physical Exam  Awake Alert, Oriented X 3, No new F.N deficits, Normal affect Symmetrical Chest wall movement, Good air movement bilaterally, CTAB RRR,No Gallops,Rubs or new Murmurs, No Parasternal Heave +ve  B.Sounds, Abd Soft, No tenderness, No rebound - guarding or rigidity. No Cyanosis, , Clubbing or edema, right lower extremity anterior chain with wound, no discharge noted, with surrounding erythema    Data Review:    CBC Recent Labs  Lab 11/07/17 1843 11/08/17 0449 11/09/17 0419  WBC 6.0 6.4 9.0  HGB 11.9* 11.1* 10.7*  HCT 37.6* 36.5* 35.1*  PLT 176 175 203  MCV 93.1 92.9 92.4  MCH 29.5 28.2 28.2  MCHC 31.6 30.4 30.5  RDW  15.7* 15.8* 16.1*  LYMPHSABS 0.4*  --   --   MONOABS 0.7  --   --   EOSABS 0.1  --   --   BASOSABS 0.0  --   --     Chemistries  Recent Labs  Lab 11/07/17 1843 11/08/17 0126 11/09/17 0419  NA 136 135 137  K 3.8 4.3 3.7  CL 93* 94* 95*  CO2 28 28 28   GLUCOSE 268* 310* 73  BUN 28* 30* 42*  CREATININE 5.57* 6.12* 8.10*  CALCIUM 8.9 8.9 9.3  MG  --  2.1  --   AST 18 17  --   ALT 15 14  --   ALKPHOS 77 70  --   BILITOT 1.3* 1.1  --    ------------------------------------------------------------------------------------------------------------------ No results for input(s): CHOL, HDL, LDLCALC, TRIG, CHOLHDL, LDLDIRECT in the last 72 hours.  Lab Results  Component Value Date   HGBA1C 9.6 (H) 11/09/2017   ------------------------------------------------------------------------------------------------------------------ No results for input(s): TSH, T4TOTAL, T3FREE, THYROIDAB in the last 72 hours.  Invalid input(s): FREET3 ------------------------------------------------------------------------------------------------------------------ No results for input(s): VITAMINB12, FOLATE, FERRITIN, TIBC, IRON, RETICCTPCT in the last 72 hours.  Coagulation profile No results for input(s): INR, PROTIME in the last 168 hours.  No results for input(s): DDIMER in the last 72 hours.  Cardiac Enzymes Recent Labs  Lab 11/08/17 0126 11/08/17 0449 11/08/17 1337  TROPONINI 0.09* 0.09* 0.07*   ------------------------------------------------------------------------------------------------------------------    Component Value Date/Time   BNP 2,339.0 (H) 11/07/2017 1843   BNP 41.7 05/05/2014 0904    Inpatient Medications  Scheduled Meds: . amLODipine  10 mg Oral Daily  . aspirin EC  81 mg Oral Daily  . atorvastatin  80 mg Oral q1800  . calcitRIOL  1.25 mcg Oral Q M,W,F-HD  . calcium acetate  2,001 mg Oral TID WC  . Chlorhexidine Gluconate Cloth  6 each Topical Q0600  . DULoxetine   30 mg Oral Daily  . heparin injection (subcutaneous)  5,000 Units Subcutaneous Q8H  . insulin aspart  0-9 Units Subcutaneous TID WC  . insulin glargine  20 Units Subcutaneous BID  . levothyroxine  75 mcg Oral QAC breakfast  . metoCLOPramide (REGLAN) injection  10 mg Intravenous Q8H  . metoprolol tartrate  50 mg Oral BID  . multivitamin  1 tablet Oral QHS  . mycophenolate  250 mg Oral BID  . pregabalin  75 mg Oral Daily   Continuous Infusions: . cefTRIAXone (ROCEPHIN)  IV 2 g (11/08/17 1851)  . vancomycin 1,000 mg (11/09/17 1110)   PRN Meds:.acetaminophen **OR** acetaminophen, albuterol, cyclobenzaprine, hydrALAZINE, HYDROcodone-acetaminophen, ondansetron (ZOFRAN) IV, sodium chloride flush  Micro Results Recent Results (from the past 240 hour(s))  Blood Culture (routine x 2)     Status: None (Preliminary result)   Collection Time: 11/07/17  6:43 PM  Result Value Ref Range Status   Specimen Description   Final    BLOOD PORTA CATH Performed at [facility], [address] [place], [place]  [address]    Special Requests   Final    BOTTLES DRAWN AEROBIC AND ANAEROBIC Blood Culture adequate volume Performed at [facility], [address], [place], [place] [address]    Culture   Final    NO GROWTH < 24 HOURS Performed at [facility], [address], [place], [place] [address]    Report Status PENDING  Incomplete  Blood Culture (routine x 2)     Status: None (Preliminary result)   Collection Time: 11/07/17  7:24 PM  Result Value Ref Range Status   Specimen Description   Final    BLOOD RIGHT HAND Performed at [facility], [address], [place], [place] [address]    Special Requests   Final    BOTTLES DRAWN AEROBIC AND ANAEROBIC Blood Culture adequate volume Performed at [facility], [address] [place], [place] [address]    Culture   Final    NO GROWTH < 24 HOURS Performed at [facility], [address] [place], [place] [address]    Report Status PENDING  Incomplete    Radiology Reports Dg Chest 2 View  Result Date: 11/07/2017 CLINICAL DATA:  Asthma.  Cough.  Diabetic. EXAM: CHEST - 2 VIEW COMPARISON:  09/05/2017 FINDINGS: Both views are degraded by patient size and technique. Midline trachea. Cardiomegaly accentuated by AP portable technique. No pleural effusion or pneumothorax. Pulmonary interstitial prominence is moderate, greater on the left. Is accentuated by low lung volumes. Prior median sternotomy. IMPRESSION: Cardiomegaly with pulmonary interstitial prominence, suspicious for pulmonary venous congestion. No overt congestive failure. Electronically Signed   By: Jeronimo Greaves M.D.   On: 11/07/2017 20:23   Dg Abd 1 View  Result Date: 11/08/2017 CLINICAL DATA:  Nausea and vomiting EXAM: ABDOMEN - 1 VIEW COMPARISON:  09/05/2017 abdominal CT FINDINGS: Normal bowel gas pattern. No abnormal stool retention. No concerning mass effect or calcification. No significant osseous finding. IMPRESSION: Normal bowel gas pattern. Electronically Signed   By: Marnee Spring M.D.   On: 11/08/2017 08:32   Ct Tibia Fibula Right W Contrast  Result Date: 11/07/2017 CLINICAL DATA:  RIGHT lower extremity puncture wound 2 weeks ago. 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 EXAM: CT OF THE LOWER RIGHT EXTREMITY WITH CONTRAST TECHNIQUE: Multidetector CT imaging of the lower right extremity (lower femur through hindfoot) was performed according to the standard protocol following intravenous contrast administration. COMPARISON:  RIGHT tibia and fibular radiographs August 29, 2016 CONTRAST:  ISOVUE-300 IOPAMIDOL (ISOVUE-300) INJECTION 61% FINDINGS: Bones/Joint/Cartilage No fracture or dislocation. No destructive bony lesions. Mild knee capsular calcification. Joint spaces maintained. 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 Moderate talar navicular osteoarthrosis. Ligaments Suboptimally assessed by CT. Muscles and Tendons Not suspicious. Soft tissues Minimal skin irregularity mid to distal anterior tibia, possible laceration. Generalized subcutaneous edema and trace effusion without focal fluid collection, subcutaneous gas or radiopaque foreign bodies. Severe vascular calcifications and extensive varicosities. Medial proximal tibial vascular clips. IMPRESSION: 1. Lower extremity interstitial edema and/or cellulitis. No subcutaneous gas or abscess. No CT findings of osteomyelitis. 2. Extensive varicosities, potential thrombophlebitis. 3. Advanced degenerative change of the ankle without fracture or dislocation. 4. Severe vascular calcifications. Electronically Signed   By: Awilda Metro M.D.   On: 11/07/2017 20:17   Vas [place] Vanice Sarah With/wo Tbi  Result Date: 11/01/2017 A LOWER EXTREMITY DOPPLER STUDY Indications: Patient presents today with complaints of  worsening painful              numbness, stabbing and thrombing sensations to both feet. He denies              any claudication symptoms. He states he takes Lyrica daily that              helps with his symptoms. If he walks too much during the day by the              time he gets home to rest he begins to feel the numbness, thrombing              and stabbing sensations in his feet. Symptoms are worse at night. High Risk Factors: Hypertension, hyperlipidemia, Diabetes, no history of                    smoking, coronary artery disease.  Vascular Interventions: CABG on 09/28/2015. Performing Technologist: Tyna Jaksch RVT  Examination Guidelines: A complete evaluation includes at minimum, Doppler waveform signals and systolic blood pressure reading at the level of bilateral brachial, anterior tibial, and posterior tibial arteries, when vessel segments are accessible. Bilateral testing is considered an integral part of a complete examination. Photoelectric  Plethysmograph (PPG) waveforms and toe systolic pressure readings are included as required and additional duplex testing as needed. Limited examinations for reoccurring indications may be performed as noted.  ABI Findings: +---------+------------------+-----+---------+---------------------------------+ Right    Rt Pressure (mmHg)IndexWaveform Comment                           +---------+------------------+-----+---------+---------------------------------+ Brachial 155                                                               +---------+------------------+-----+---------+---------------------------------+ ATA      254               1.64 triphasic                                  +---------+------------------+-----+---------+---------------------------------+ PTA      254               1.64 triphasic                                  +---------+------------------+-----+---------+---------------------------------+ PERO                                     inaudible (difficulty innosating                                           possibly due to hardening of                                               skin)                             +---------+------------------+-----+---------+---------------------------------+  Great Toe190               1.23 Normal                                     +---------+------------------+-----+---------+---------------------------------+ +---------+------------------+-----+---------+---------------------------------+ Left     Lt Pressure (mmHg)IndexWaveform Comment                           +---------+------------------+-----+---------+---------------------------------+ Brachial AVF                                                               +---------+------------------+-----+---------+---------------------------------+ ATA      254               1.64 triphasic                                   +---------+------------------+-----+---------+---------------------------------+ PTA      254               1.64 triphasic                                  +---------+------------------+-----+---------+---------------------------------+ PERO                                     inaudible (difficulty innosating                                           possibly due to hardening of                                               skin)                             +---------+------------------+-----+---------+---------------------------------+ Great Toe200               1.29 Normal                                     +---------+------------------+-----+---------+---------------------------------+ +-------+-----------+-----------+------------+------------+ ABI/TBIToday's ABIToday's TBIPrevious ABIPrevious TBI +-------+-----------+-----------+------------+------------+ Right  [name]         1.23       [place]          [place]           +-------+-----------+-----------+------------+------------+ Left   [place]         1.29       [place]          [place]           +-------+-----------+-----------+------------+------------+ Right ABIs appear essentially unchanged compared to prior study on 11/16/2016. Left ABIs appear essentially unchanged compared to prior study on 11/16/2016.  Summary: Right: Resting right ankle-brachial index indicates noncompressible right lower extremity arteries.The right toe-brachial index is normal. Left: Resting left ankle-brachial index indicates noncompressible left lower extremity arteries.The left toe-brachial index is normal.  *See table(s) above for measurements and observations.     Preliminary  A LOWER EXTREMITY DOPPLER STUDY Indications: Patient presents today with complaints of worsening painful              numbness, stabbing and throbbing sensations to both feet. He denies              any claudication symptoms. He states he takes Lyrica daily that              helps with  his symptoms. If he walks too much during the day by the              time he gets home to rest he begins to feel the numbness, throbbing              and stabbing sensations in his feet. Symptoms are worse at night. High Risk Factors: Hypertension, hyperlipidemia, Diabetes, no history of                    smoking, coronary artery disease.  Vascular Interventions: CABG on 09/28/2015. Performing Technologist: Tyna Jaksch RVT  Examination Guidelines: A complete evaluation includes at minimum, Doppler waveform signals and systolic blood pressure reading at the level of bilateral brachial, anterior tibial, and posterior tibial arteries, when vessel segments are accessible. Bilateral testing is considered an integral part of a complete examination. Photoelectric Plethysmograph (PPG) waveforms and toe systolic pressure readings are included as required and additional duplex testing as needed. Limited examinations for reoccurring indications may be performed as noted.  ABI Findings: +---------+------------------+-----+---------+---------------------------------+ Right    Rt Pressure (mmHg)IndexWaveform Comment                           +---------+------------------+-----+---------+---------------------------------+ Brachial 155                                                               +---------+------------------+-----+---------+---------------------------------+ ATA      254               1.64 triphasic                                  +---------+------------------+-----+---------+---------------------------------+ PTA      254               1.64 triphasic                                  +---------+------------------+-----+---------+---------------------------------+ PERO                                     inaudible (difficulty insonating  possibly due to hardening of                                               skin)                              +---------+------------------+-----+---------+---------------------------------+ Great Toe190               1.23 Normal                                     +---------+------------------+-----+---------+---------------------------------+ +---------+------------------+-----+---------+---------------------------------+ Left     Lt Pressure (mmHg)IndexWaveform Comment                           +---------+------------------+-----+---------+---------------------------------+ Brachial AVF                                                               +---------+------------------+-----+---------+---------------------------------+ ATA      254               1.64 triphasic                                  +---------+------------------+-----+---------+---------------------------------+ PTA      254               1.64 triphasic                                  +---------+------------------+-----+---------+---------------------------------+ PERO                                     inaudible (difficulty insonating                                           possibly due to hardening of                                               skin)                             +---------+------------------+-----+---------+---------------------------------+ Great Toe200               1.29 Normal                                     +---------+------------------+-----+---------+---------------------------------+ +-------+-----------+-----------+------------+------------+ ABI/TBIToday's ABIToday's TBIPrevious ABIPrevious TBI +-------+-----------+-----------+------------+------------+ Right  [place]         1.23       [place]  [place]           +-------+-----------+-----------+------------+------------+ Left   [place]         1.29       [place]          [place]           +-------+-----------+-----------+------------+------------+ Right ABIs appear essentially unchanged compared to  prior study on 11/16/2016. Left ABIs appear essentially unchanged compared to prior study on 11/16/2016.  Summary: Right: Resting right ankle-brachial index indicates noncompressible right lower extremity arteries. The right toe-brachial index is normal. Left: Resting left ankle-brachial index indicates noncompressible left lower extremity arteries. The left toe-brachial index is normal.  *See table(s) above for measurements and observations.     Preliminary  A LOWER EXTREMITY DOPPLER STUDY Indications: In 11/2016, an arterial Doppler performed at [facility]              [facility] showed an ABI of non comppressible vessels,              bilaterally. Patient presents today with complaints of worsening              painful numbness, stabbing and throbbing sensations to both feet.              He denies any claudication symptoms. He states he takes Lyrica              daily that helps with his symptoms. If he walks too much during the              day by the time he gets home to rest he begins to feel the              numbness, throbbing and stabbing sensations in his feet. Symptoms              are worse at night. High Risk Factors: Hypertension, hyperlipidemia, Diabetes, no history of                    smoking, coronary artery disease.  Vascular Interventions: CABG on 09/28/2015. Performing Technologist: Tyna Jaksch RVT  Examination Guidelines: A complete evaluation includes at minimum, Doppler waveform signals and systolic blood pressure reading at the level of bilateral brachial, anterior tibial, and posterior tibial arteries, when vessel segments are accessible. Bilateral testing is considered an integral part of a complete examination. Photoelectric Plethysmograph (PPG) waveforms and toe systolic pressure readings are included as required and additional duplex testing as needed. Limited examinations for reoccurring indications may be performed as noted.  ABI Findings:  +---------+------------------+-----+---------+---------------------------------+ Right    Rt Pressure (mmHg)IndexWaveform Comment                           +---------+------------------+-----+---------+---------------------------------+ Brachial 155                                                               +---------+------------------+-----+---------+---------------------------------+ ATA      254               1.64 triphasic                                  +---------+------------------+-----+---------+---------------------------------+  PTA      254               1.64 triphasic                                  +---------+------------------+-----+---------+---------------------------------+ PERO                                     inaudible (difficulty insonating                                           possibly due to hardening of                                               skin)                             +---------+------------------+-----+---------+---------------------------------+ Great Toe190               1.23 Normal                                     +---------+------------------+-----+---------+---------------------------------+ +---------+------------------+-----+---------+---------------------------------+ Left     Lt Pressure (mmHg)IndexWaveform Comment                           +---------+------------------+-----+---------+---------------------------------+ Brachial AVF                                                               +---------+------------------+-----+---------+---------------------------------+ ATA      254               1.64 triphasic                                  +---------+------------------+-----+---------+---------------------------------+ PTA      254               1.64 triphasic                                  +---------+------------------+-----+---------+---------------------------------+ PERO                                      inaudible (difficulty insonating                                           possibly due to hardening of  skin)                             +---------+------------------+-----+---------+---------------------------------+ Great Toe200               1.29 Normal                                     +---------+------------------+-----+---------+---------------------------------+ +-------+-----------+-----------+------------+------------+ ABI/TBIToday's ABIToday's TBIPrevious ABIPrevious TBI +-------+-----------+-----------+------------+------------+ Right  [place]         1.23       [place]          [place]           +-------+-----------+-----------+------------+------------+ Left   [place]         1.29       [place]          [place]           +-------+-----------+-----------+------------+------------+  Right ABIs appear essentially unchanged compared to prior study on 11/16/2016. Left ABIs appear essentially unchanged compared to prior study on 11/16/2016.  Summary: Right: Resting right ankle-brachial index indicates noncompressible right lower extremity arteries. The right toe-brachial index is normal. Left: Resting left ankle-brachial index indicates noncompressible left lower extremity arteries. The left toe-brachial index is normal.  *See table(s) above for measurements and observations.  Electronically signed by Julien Nordmann MD on 11/01/2017 at 9:30:41 PM.    Final       Huey Bienenstock M.D on 11/09/2017 at 2:07 PM  Between 7am to 7pm - Pager - [phone]  After 7pm go to [email or web address] - password [facility]  [facility] -  Office  [phone]

## 2017-11-09 NOTE — Plan of Care (Signed)

## 2017-11-09 NOTE — Progress Notes (Signed)
Inpatient Diabetes Program Recommendations  AACE/ADA: New Consensus Statement on Inpatient Glycemic Control (2015)  Target Ranges:  Prepandial:   less than 140 mg/dL      Peak postprandial:   less than 180 mg/dL (1-2 hours)      Critically ill patients:  140 - 180 mg/dL   Lab Results  Component Value Date   GLUCAP 83 11/09/2017   HGBA1C 9.6 (H) 11/09/2017    Review of Glycemic Control Results for MATHAN, DARROCH" (MRN [id number]) as of 11/09/2017 10:46  Ref. Range 11/08/2017 21:38 11/09/2017 06:51 11/09/2017 10:42  Glucose-Capillary Latest Ref Range: 70 - 99 mg/dL 409 (H) 63 (L) 83  Diabetes history: Type 2 DM Outpatient Diabetes medications: Novolog 5-10 units TID, Lantus 30 units BID Current orders for Inpatient glycemic control: Lantus 30 units BID, Novolog 0-9 units TID   Inpatient Diabetes Program Recommendations:    Noted patient experienced hypoglycemic episode of 63 mg/dl this am. Assuming his renal status and on 11/7 insulin administration at 1507 was delayed by 3 hours from time of previous blood sugar could be to explain. Will continue to follow with current orders.  Thanks, [name], MSN, RNC-OB Diabetes Coordinator [phone] (8a-5p)

## 2017-11-10 DIAGNOSIS — R112 Nausea with vomiting, unspecified: Secondary | ICD-10-CM

## 2017-11-10 LAB — GLUCOSE, CAPILLARY
GLUCOSE-CAPILLARY: 55 mg/dL — AB (ref 70–99)
GLUCOSE-CAPILLARY: 90 mg/dL (ref 70–99)
Glucose-Capillary: 89 mg/dL (ref 70–99)
Glucose-Capillary: 109 mg/dL — ABNORMAL HIGH (ref 70–99)
Glucose-Capillary: 186 mg/dL — ABNORMAL HIGH (ref 70–99)

## 2017-11-10 LAB — MRSA PCR SCREENING: MRSA by PCR: NEGATIVE

## 2017-11-10 MED ORDER — LOPERAMIDE HCL 2 MG PO CAPS
2.0000 mg | ORAL_CAPSULE | ORAL | Status: DC | PRN
  Administered 2017-11-10: 2 mg via ORAL
  Filled 2017-11-10: qty 1

## 2017-11-10 MED ORDER — INSULIN GLARGINE 100 UNIT/ML ~~LOC~~ SOLN
10.0000 [IU] | Freq: Two times a day (BID) | SUBCUTANEOUS | Status: DC
  Administered 2017-11-10 – 2017-11-11 (×2): 10 [IU] via SUBCUTANEOUS
  Filled 2017-11-10 (×3): qty 0.1

## 2017-11-10 MED ORDER — HYDRALAZINE HCL 25 MG PO TABS
25.0000 mg | ORAL_TABLET | Freq: Two times a day (BID) | ORAL | Status: DC
  Administered 2017-11-10 – 2017-11-11 (×3): 25 mg via ORAL
  Filled 2017-11-10 (×3): qty 1

## 2017-11-10 MED ORDER — MUPIROCIN CALCIUM 2 % EX CREA
TOPICAL_CREAM | Freq: Four times a day (QID) | CUTANEOUS | Status: DC
  Administered 2017-11-10 (×2): via TOPICAL
  Administered 2017-11-11: 1 via TOPICAL
  Filled 2017-11-10: qty 15

## 2017-11-10 MED ORDER — ALPRAZOLAM 0.25 MG PO TABS
0.2500 mg | ORAL_TABLET | Freq: Once | ORAL | Status: AC
  Administered 2017-11-10: 0.25 mg via ORAL
  Filled 2017-11-10: qty 1

## 2017-11-10 NOTE — Progress Notes (Signed)
Pt refused continuous pulse ox, importance of continuous monitoring explained to pt.

## 2017-11-10 NOTE — Progress Notes (Signed)
Pt c/o anxiety, unable to sleep. Per pt, he takes xanax PRN at home. [place], NP paged.

## 2017-11-10 NOTE — Progress Notes (Signed)
Pt BP 192/125, Hydralazine PRN given, on recheck BP 175/102. [place], NP notified.

## 2017-11-10 NOTE — Progress Notes (Signed)
Patient walked down the hallway and around the nursing station. He tolerated it well. Will continue to monitor patient.

## 2017-11-10 NOTE — Progress Notes (Signed)
During night pt c/o nausea had dry heaving, Zofran PRN and scheduled Reglan given and minimal improvement verbalized by pt. NP on call made aware. Per pt this is not new for him, he has experienced same previously due gastroparesis.

## 2017-11-10 NOTE — Progress Notes (Signed)
PROGRESS NOTE                                                                                                                                                                                                             Patient Demographics:    Russell [name], is a 47 y.o. male, DOB - 11/08/1970, ZOX:[id number]  Admit date - 11/07/2017   Admitting Physician Eduard Clos, MD  Outpatient Primary MD for the patient is [name], MD  LOS - 3   Chief Complaint  Patient presents with  . Leg Injury       Brief Narrative    47 y.o. male with history of diabetes mellitus type 2 on insulin, ESRD on hemodialysis Monday Wednesday and Friday, CIDP, CAD status post CABG, hypertension, hypothyroidism, gastroparesis hurt his leg 2 weeks ago on his right anterior shin and since then has been having increasing swelling erythema and had gone to his PCP and was prescribed antibiotics which he has been taking for almost 2 weeks despite which patient is erythema has worsened with increasing discharge from the wound and pain.   Subjective:    Russell [name] today reports of recurrence of his nausea and vomiting, he was unable to tolerate any oral intake today so far, actually he was hypoglycemic .   Assessment  & Plan :    Active Problems:   Hypothyroidism (acquired)   Uncontrolled diabetes mellitus type 2 with peripheral artery disease (HCC)   Coronary artery disease involving native coronary artery of native heart without angina pectoris   S/P CABG (coronary artery bypass graft)   HTN (hypertension), malignant   Quadriparesis (HCC)   CIDP (chronic inflammatory demyelinating polyneuropathy) (HCC)   Gastroparesis due to DM (HCC)   LBBB (left bundle branch block)   Cellulitis   Cellulitis of right lower extremity   Elevated troponin  Right lower extremity cellulitis  -CT scan does not show any definite collection of fluid.  Patient states he does have  discharge.   Patient did have a recent ABI done on November 01, 2017 which showed normal toe brachial index of both lower extremities.  -For now on IV vancomycin and Rocephin, Comycin has been stopped, as he is afebrile, with no leukocytosis and no discharge from wound, continue with Rocephin, started on topical Bactroban ointment .Marland Kitchen  Hypertension  -  elevated, on  home medication include metoprolol, Norvasc, started on oral hydralazine by renal, as well added PRN hydralazine   Hypoxia -  likely from mild fluid overload with history of ESRD and systolic heart failure last EF measured was 45 to 50% with grade 2 diastolic dysfunction in September 2019.  Fluid management per nephrology.  Elevated troponin  -Patient with known history of CAD, status post CABG, but currently he denies any chest pain  -Troponins are chronically elevated, most likely in ESRD, non-ACS pattern  Diabetes mellitus type 2  - on Lantus insulin 30 units twice daily which was confirmed with patient.    Has been lowered to 20 units twice daily, he was hypoglycemic, has been lowered further to 10 units twice daily, hypoglycemic this morning as well, this is more likely due to no oral intake, for now continue with Lantus 10 twice daily, and sliding scale. -HGb A1c is 9.6  CIDP  - on CellCept and IVIG.  Patient is due for his IVIG today, he discussed with his home care, they will reschedule for Monday.  Nausea and vomiting  -Abdominal exam is benign, denies any abdominal pain, KUB was obtained which was negative, this is secondary to gastroparesis, he has a transient improvement yesterday after started on scheduled IV Reglan, but he is again with significant nausea and vomiting today, switch back to clear liquid diet.  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  Hypothyroidism on Synthroid.    Code Status : Full code  Family Communication  : None at  bedside  Disposition Plan  : Home when stable  Consults  :  Renal  Procedures  : none  DVT Prophylaxis  :  [place] heparin  Lab Results  Component Value Date   PLT 203 11/09/2017    Antibiotics  :    Anti-infectives (From admission, onward)   Start     Dose/Rate Route Frequency Ordered Stop   11/09/17 1200  vancomycin (VANCOCIN) IVPB 1000 mg/200 mL premix  Status:  Discontinued     1,000 mg 200 mL/hr over 60 Minutes Intravenous Every M-W-F (Hemodialysis) 11/08/17 0011 11/09/17 1407   11/07/17 1830  vancomycin (VANCOCIN) 2,000 mg in sodium chloride 0.9 % 500 mL IVPB  Status:  Discontinued     2,000 mg 250 mL/hr over 120 Minutes Intravenous  Once 11/07/17 1813 11/07/17 1822   11/07/17 1830  vancomycin (VANCOCIN) 2,500 mg in sodium chloride 0.9 % 500 mL IVPB     2,500 mg 250 mL/hr over 120 Minutes Intravenous  Once 11/07/17 1822 11/07/17 2202   11/07/17 1815  cefTRIAXone (ROCEPHIN) 2 g in sodium chloride 0.9 % 100 mL IVPB     2 g 200 mL/hr over 30 Minutes Intravenous Every 24 hours 11/07/17 1809          Objective:   Vitals:   11/10/17 0512 11/10/17 0631 11/10/17 1006 11/10/17 1127  BP: (!) 192/125 (!) 175/102 (!) 178/104 (!) 164/103  Pulse: (!) 112  (!) 112 89  Resp: 20  18 16   Temp: 98.5 F (36.9 C)  97.9 F (36.6 C) 98.2 F (36.8 C)  TempSrc:   Axillary Oral  SpO2: 100%  95% 94%  Weight:      Height:        Wt Readings from Last 3 Encounters:  11/10/17 116.9 kg  11/07/17 118.8 kg  11/01/17 121.1 kg     Intake/Output Summary (Last 24 hours) at 11/10/2017  1437 Last data filed at 11/10/2017 0355 Gross per 24 hour  Intake -  Output 1 ml  Net -1 ml     Physical Exam  Awake Alert, Oriented X 3, in some discomfort secondary to nausea Symmetrical Chest wall movement, Good air movement bilaterally, CTAB RRR,No Gallops,Rubs or new Murmurs, No Parasternal Heave +ve B.Sounds, Abd Soft, No tenderness, No rebound - guarding or rigidity. No Cyanosis, , Clubbing or  edema, right lower extremity anterior chain with wound, no discharge noted, with surrounding erythema    Data Review:    CBC Recent Labs  Lab 11/07/17 1843 11/08/17 0449 11/09/17 0419  WBC 6.0 6.4 9.0  HGB 11.9* 11.1* 10.7*  HCT 37.6* 36.5* 35.1*  PLT 176 175 203  MCV 93.1 92.9 92.4  MCH 29.5 28.2 28.2  MCHC 31.6 30.4 30.5  RDW 15.7* 15.8* 16.1*  LYMPHSABS 0.4*  --   --   MONOABS 0.7  --   --   EOSABS 0.1  --   --   BASOSABS 0.0  --   --     Chemistries  Recent Labs  Lab 11/07/17 1843 11/08/17 0126 11/09/17 0419  NA 136 135 137  K 3.8 4.3 3.7  CL 93* 94* 95*  CO2 28 28 28   GLUCOSE 268* 310* 73  BUN 28* 30* 42*  CREATININE 5.57* 6.12* 8.10*  CALCIUM 8.9 8.9 9.3  MG  --  2.1  --   AST 18 17  --   ALT 15 14  --   ALKPHOS 77 70  --   BILITOT 1.3* 1.1  --    ------------------------------------------------------------------------------------------------------------------ No results for input(s): CHOL, HDL, LDLCALC, TRIG, CHOLHDL, LDLDIRECT in the last 72 hours.  Lab Results  Component Value Date   HGBA1C 9.6 (H) 11/09/2017   ------------------------------------------------------------------------------------------------------------------ No results for input(s): TSH, T4TOTAL, T3FREE, THYROIDAB in the last 72 hours.  Invalid input(s): FREET3 ------------------------------------------------------------------------------------------------------------------ No results for input(s): VITAMINB12, FOLATE, FERRITIN, TIBC, IRON, RETICCTPCT in the last 72 hours.  Coagulation profile No results for input(s): INR, PROTIME in the last 168 hours.  No results for input(s): DDIMER in the last 72 hours.  Cardiac Enzymes Recent Labs  Lab 11/08/17 0126 11/08/17 0449 11/08/17 1337  TROPONINI 0.09* 0.09* 0.07*   ------------------------------------------------------------------------------------------------------------------    Component Value Date/Time   BNP 2,339.0  (H) 11/07/2017 1843   BNP 41.7 05/05/2014 0904    Inpatient Medications  Scheduled Meds: . amLODipine  10 mg Oral Daily  . aspirin EC  81 mg Oral Daily  . atorvastatin  80 mg Oral q1800  . calcitRIOL  1.25 mcg Oral Q M,W,F-HD  . calcium acetate  2,001 mg Oral TID WC  . Chlorhexidine Gluconate Cloth  6 each Topical Q0600  . DULoxetine  30 mg Oral Daily  . heparin injection (subcutaneous)  5,000 Units Subcutaneous Q8H  . hydrALAZINE  25 mg Oral BID  . insulin aspart  0-9 Units Subcutaneous TID WC  . insulin glargine  10 Units Subcutaneous BID  . levothyroxine  75 mcg Oral QAC breakfast  . metoCLOPramide (REGLAN) injection  10 mg Intravenous Q8H  . metoprolol tartrate  50 mg Oral BID  . multivitamin  1 tablet Oral QHS  . mupirocin cream   Topical QID  . mycophenolate  250 mg Oral BID  . pregabalin  75 mg Oral Daily   Continuous Infusions: . cefTRIAXone (ROCEPHIN)  IV 2 g (11/08/17 1851)   PRN Meds:.acetaminophen **OR** acetaminophen, albuterol, cyclobenzaprine, hydrALAZINE, HYDROcodone-acetaminophen, ondansetron (ZOFRAN)  IV, sodium chloride flush  Micro Results Recent Results (from the past 240 hour(s))  Blood Culture (routine x 2)     Status: None (Preliminary result)   Collection Time: 11/07/17  6:43 PM  Result Value Ref Range Status   Specimen Description   Final    BLOOD PORTA CATH Performed at [facility], [address] [place], [place] [address]    Special Requests   Final    BOTTLES DRAWN AEROBIC AND ANAEROBIC Blood Culture adequate volume Performed at [facility], [address] [place], [place] [address]    Culture   Final    NO GROWTH 2 DAYS Performed at [facility], [address] [place], [place] [address]    Report Status PENDING  Incomplete  Blood Culture (routine x 2)     Status: None (Preliminary result)   Collection Time: 11/07/17  7:24 PM  Result Value Ref Range Status   Specimen Description   Final     BLOOD RIGHT HAND Performed at [facility], [address] [place], [place] [address]    Special Requests   Final    BOTTLES DRAWN AEROBIC AND ANAEROBIC Blood Culture adequate volume Performed at [facility], [address], [place], [place] [address]    Culture   Final    NO GROWTH 2 DAYS Performed at [facility], [address], [place], [place] [address]    Report Status PENDING  Incomplete  MRSA PCR Screening     Status: None   Collection Time: 11/10/17  4:55 AM  Result Value Ref Range Status   MRSA by PCR NEGATIVE NEGATIVE Final    Comment:        The GeneXpert MRSA Assay (FDA approved for NASAL specimens only), is one component of a comprehensive MRSA colonization surveillance program. It is not intended to diagnose MRSA infection nor to guide or monitor treatment for MRSA infections. Performed at [facility], [address], [place], [place] [address]     Radiology Reports Dg Chest 2 View  Result Date: 11/07/2017 CLINICAL DATA:  Asthma.  Cough.  Diabetic. EXAM: CHEST - 2 VIEW COMPARISON:  09/05/2017 FINDINGS: Both views are degraded by patient size and technique. Midline trachea. Cardiomegaly accentuated by AP portable technique. No pleural effusion or pneumothorax. Pulmonary interstitial prominence is moderate, greater on the left. Is accentuated by low lung volumes. Prior median sternotomy. IMPRESSION: Cardiomegaly with pulmonary interstitial prominence, suspicious for pulmonary venous congestion. No overt congestive failure. Electronically Signed   By: Jeronimo Greaves M.D.   On: 11/07/2017 20:23   Dg Abd 1 View  Result Date: 11/08/2017 CLINICAL DATA:  Nausea and vomiting EXAM: ABDOMEN - 1 VIEW COMPARISON:  09/05/2017 abdominal CT FINDINGS: Normal bowel gas pattern. No abnormal stool retention. No concerning mass effect or calcification. No significant osseous finding. IMPRESSION: Normal bowel gas pattern.  Electronically Signed   By: Marnee Spring M.D.   On: 11/08/2017 08:32   Ct Tibia Fibula Right W Contrast  Result Date: 11/07/2017 CLINICAL DATA:  RIGHT lower extremity puncture wound 2 weeks ago. 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 EXAM: CT OF THE LOWER RIGHT EXTREMITY WITH CONTRAST TECHNIQUE: Multidetector CT imaging of the lower right extremity (lower femur through hindfoot) was performed according to the standard protocol following intravenous contrast administration. COMPARISON:  RIGHT tibia and fibular radiographs August 29, 2016 CONTRAST:  ISOVUE-300 IOPAMIDOL (ISOVUE-300) INJECTION  61% FINDINGS: Bones/Joint/Cartilage No fracture or dislocation. No destructive bony lesions. Mild knee capsular calcification. Joint spaces maintained. 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 Moderate talar navicular osteoarthrosis. Ligaments Suboptimally assessed by CT. Muscles and Tendons Not suspicious. Soft tissues Minimal skin irregularity mid to distal anterior tibia, possible laceration. Generalized subcutaneous edema and trace effusion without focal fluid collection, subcutaneous gas or radiopaque foreign bodies. Severe vascular calcifications and extensive varicosities. Medial proximal tibial vascular clips. IMPRESSION: 1. Lower extremity interstitial edema and/or cellulitis. No subcutaneous gas or abscess. No CT findings of osteomyelitis. 2. Extensive varicosities, potential thrombophlebitis. 3. Advanced degenerative change of the ankle without fracture or dislocation. 4. Severe vascular calcifications. Electronically Signed   By: Awilda Metro M.D.   On: 11/07/2017 20:17   Vas [place] Vanice Sarah With/wo Tbi  Result Date: 11/01/2017 A LOWER EXTREMITY DOPPLER STUDY Indications: Patient presents today with complaints of worsening painful              numbness, stabbing and thrombing sensations to both feet. He denies               any claudication symptoms. He states he takes Lyrica daily that              helps with his symptoms. If he walks too much during the day by the              time he gets home to rest he begins to feel the numbness, thrombing              and stabbing sensations in his feet. Symptoms are worse at night. High Risk Factors: Hypertension, hyperlipidemia, Diabetes, no history of                    smoking, coronary artery disease.  Vascular Interventions: CABG on 09/28/2015. Performing Technologist: Tyna Jaksch RVT  Examination Guidelines: A complete evaluation includes at minimum, Doppler waveform signals and systolic blood pressure reading at the level of bilateral brachial, anterior tibial, and posterior tibial arteries, when vessel segments are accessible. Bilateral testing is considered an integral part of a complete examination. Photoelectric Plethysmograph (PPG) waveforms and toe systolic pressure readings are included as required and additional duplex testing as needed. Limited examinations for reoccurring indications may be performed as noted.  ABI Findings: +---------+------------------+-----+---------+---------------------------------+ Right    Rt Pressure (mmHg)IndexWaveform Comment                           +---------+------------------+-----+---------+---------------------------------+ Brachial 155                                                               +---------+------------------+-----+---------+---------------------------------+ ATA      254               1.64 triphasic                                  +---------+------------------+-----+---------+---------------------------------+ PTA      254               1.64 triphasic                                  +---------+------------------+-----+---------+---------------------------------+  PERO                                     inaudible (difficulty innosating                                            possibly due to hardening of                                               skin)                             +---------+------------------+-----+---------+---------------------------------+ Great Toe190               1.23 Normal                                     +---------+------------------+-----+---------+---------------------------------+ +---------+------------------+-----+---------+---------------------------------+ Left     Lt Pressure (mmHg)IndexWaveform Comment                           +---------+------------------+-----+---------+---------------------------------+ Brachial AVF                                                               +---------+------------------+-----+---------+---------------------------------+ ATA      254               1.64 triphasic                                  +---------+------------------+-----+---------+---------------------------------+ PTA      254               1.64 triphasic                                  +---------+------------------+-----+---------+---------------------------------+ PERO                                     inaudible (difficulty innosating                                           possibly due to hardening of                                               skin)                             +---------+------------------+-----+---------+---------------------------------+ Saverio Danker  1.29 Normal                                     +---------+------------------+-----+---------+---------------------------------+ +-------+-----------+-----------+------------+------------+ ABI/TBIToday's ABIToday's TBIPrevious ABIPrevious TBI +-------+-----------+-----------+------------+------------+ Right  [place]         1.23       [place]          [place]           +-------+-----------+-----------+------------+------------+ Left   [place]         1.29       [place]          [place]            +-------+-----------+-----------+------------+------------+ Right ABIs appear essentially unchanged compared to prior study on 11/16/2016. Left ABIs appear essentially unchanged compared to prior study on 11/16/2016.  Summary: Right: Resting right ankle-brachial index indicates noncompressible right lower extremity arteries.The right toe-brachial index is normal. Left: Resting left ankle-brachial index indicates noncompressible left lower extremity arteries.The left toe-brachial index is normal.  *See table(s) above for measurements and observations.     Preliminary  A LOWER EXTREMITY DOPPLER STUDY Indications: Patient presents today with complaints of worsening painful              numbness, stabbing and throbbing sensations to both feet. He denies              any claudication symptoms. He states he takes Lyrica daily that              helps with his symptoms. If he walks too much during the day by the              time he gets home to rest he begins to feel the numbness, throbbing              and stabbing sensations in his feet. Symptoms are worse at night. High Risk Factors: Hypertension, hyperlipidemia, Diabetes, no history of                    smoking, coronary artery disease.  Vascular Interventions: CABG on 09/28/2015. Performing Technologist: Tyna Jaksch RVT  Examination Guidelines: A complete evaluation includes at minimum, Doppler waveform signals and systolic blood pressure reading at the level of bilateral brachial, anterior tibial, and posterior tibial arteries, when vessel segments are accessible. Bilateral testing is considered an integral part of a complete examination. Photoelectric Plethysmograph (PPG) waveforms and toe systolic pressure readings are included as required and additional duplex testing as needed. Limited examinations for reoccurring indications may be performed as noted.  ABI Findings: +---------+------------------+-----+---------+---------------------------------+ Right     Rt Pressure (mmHg)IndexWaveform Comment                           +---------+------------------+-----+---------+---------------------------------+ Brachial 155                                                               +---------+------------------+-----+---------+---------------------------------+ ATA      254               1.64 triphasic                                  +---------+------------------+-----+---------+---------------------------------+  PTA      254               1.64 triphasic                                  +---------+------------------+-----+---------+---------------------------------+ PERO                                     inaudible (difficulty insonating                                           possibly due to hardening of                                               skin)                             +---------+------------------+-----+---------+---------------------------------+ Great Toe190               1.23 Normal                                     +---------+------------------+-----+---------+---------------------------------+ +---------+------------------+-----+---------+---------------------------------+ Left     Lt Pressure (mmHg)IndexWaveform Comment                           +---------+------------------+-----+---------+---------------------------------+ Brachial AVF                                                               +---------+------------------+-----+---------+---------------------------------+ ATA      254               1.64 triphasic                                  +---------+------------------+-----+---------+---------------------------------+ PTA      254               1.64 triphasic                                  +---------+------------------+-----+---------+---------------------------------+ PERO                                     inaudible (difficulty insonating                                            possibly due to hardening of  skin)                             +---------+------------------+-----+---------+---------------------------------+ Great Toe200               1.29 Normal                                     +---------+------------------+-----+---------+---------------------------------+ +-------+-----------+-----------+------------+------------+ ABI/TBIToday's ABIToday's TBIPrevious ABIPrevious TBI +-------+-----------+-----------+------------+------------+ Right  [place]         1.23       [place]          [place]           +-------+-----------+-----------+------------+------------+ Left   [place]         1.29                            +-------+-----------+-----------+------------+------------+ Right ABIs appear essentially unchanged compared to prior study on 11/16/2016. Left ABIs appear essentially unchanged compared to prior study on 11/16/2016.  Summary: Right: Resting right ankle-brachial index indicates noncompressible right lower extremity arteries. The right toe-brachial index is normal. Left: Resting left ankle-brachial index indicates noncompressible left lower extremity arteries. The left toe-brachial index is normal.  *See table(s) above for measurements and observations.     Preliminary  A LOWER EXTREMITY DOPPLER STUDY Indications: In 11/2016, an arterial Doppler performed at Cardiovascular at              [facility] showed an ABI of non comppressible vessels,              bilaterally. Patient presents today with complaints of worsening              painful numbness, stabbing and throbbing sensations to both feet.              He denies any claudication symptoms. He states he takes Lyrica              daily that helps with his symptoms. If he walks too much during the              day by the time he gets home to rest he begins to feel the              numbness, throbbing and stabbing sensations in  his feet. Symptoms              are worse at night. High Risk Factors: Hypertension, hyperlipidemia, Diabetes, no history of                    smoking, coronary artery disease.  Vascular Interventions: CABG on 09/28/2015. Performing Technologist: Tyna Jaksch RVT  Examination Guidelines: A complete evaluation includes at minimum, Doppler waveform signals and systolic blood pressure reading at the level of bilateral brachial, anterior tibial, and posterior tibial arteries, when vessel segments are accessible. Bilateral testing is considered an integral part of a complete examination. Photoelectric Plethysmograph (PPG) waveforms and toe systolic pressure readings are included as required and additional duplex testing as needed. Limited examinations for reoccurring indications may be performed as noted.  ABI Findings: +---------+------------------+-----+---------+---------------------------------+ Right    Rt Pressure (mmHg)IndexWaveform Comment                           +---------+------------------+-----+---------+---------------------------------+ Brachial 155                                                               +---------+------------------+-----+---------+---------------------------------+  ATA      254               1.64 triphasic                                  +---------+------------------+-----+---------+---------------------------------+ PTA      254               1.64 triphasic                                  +---------+------------------+-----+---------+---------------------------------+ PERO                                     inaudible (difficulty insonating                                           possibly due to hardening of                                               skin)                             +---------+------------------+-----+---------+---------------------------------+ Great Toe190               1.23 Normal                                      +---------+------------------+-----+---------+---------------------------------+ +---------+------------------+-----+---------+---------------------------------+ Left     Lt Pressure (mmHg)IndexWaveform Comment                           +---------+------------------+-----+---------+---------------------------------+ Brachial AVF                                                               +---------+------------------+-----+---------+---------------------------------+ ATA      254               1.64 triphasic                                  +---------+------------------+-----+---------+---------------------------------+ PTA      254               1.64 triphasic                                  +---------+------------------+-----+---------+---------------------------------+ PERO                                     inaudible (difficulty insonating  possibly due to hardening of                                               skin)                             +---------+------------------+-----+---------+---------------------------------+ Great Toe200               1.29 Normal                                     +---------+------------------+-----+---------+---------------------------------+ +-------+-----------+-----------+------------+------------+ ABI/TBIToday's ABIToday's TBIPrevious ABIPrevious TBI +-------+-----------+-----------+------------+------------+ Right  [place]         1.23       [place]          [place]           +-------+-----------+-----------+------------+------------+ Left   [place]         1.29       [place]          [place]           +-------+-----------+-----------+------------+------------+  Right ABIs appear essentially unchanged compared to prior study on 11/16/2016. Left ABIs appear essentially unchanged compared to prior study on 11/16/2016.  Summary: Right: Resting right ankle-brachial index indicates  noncompressible right lower extremity arteries. The right toe-brachial index is normal. Left: Resting left ankle-brachial index indicates noncompressible left lower extremity arteries. The left toe-brachial index is normal.  *See table(s) above for measurements and observations.  Electronically signed by Julien Nordmann MD on 11/01/2017 at 9:30:41 PM.    Final       Huey Bienenstock M.D on 11/10/2017 at 2:37 PM  Between 7am to 7pm - Pager - [phone]  After 7pm go to [email or web address] - password [facility]  [facility] -  Office  [phone]

## 2017-11-10 NOTE — Progress Notes (Addendum)
[facility] Progress Note   Subjective: C/O nausea, thinks it's his gastroparesis flaring. Not eating well.     Objective Vitals:   11/10/17 0500 11/10/17 0512 11/10/17 0631 11/10/17 1006  BP:  (!) 192/125 (!) 175/102 (!) 178/104  Pulse:  (!) 112  (!) 112  Resp:  20    Temp:  98.5 F (36.9 C)    TempSrc:      SpO2:  100%  95%  Weight: 116.9 kg     Height:       Physical Exam General: Disheveled appearing male in NAD Heart:S1,S2 RRR Lungs: CTAB Abdomen: Obese, active BS Extremities: Trace pedal edema. Erythema noted R shin-feet discolored, several toes with eschar on tips of toes.  Dialysis Access: L AVF + bruit    Additional Objective Labs: Basic Metabolic Panel: Recent Labs  Lab 11/07/17 1843 11/08/17 0126 11/09/17 0419  NA 136 135 137  K 3.8 4.3 3.7  CL 93* 94* 95*  CO2 28 28 28   GLUCOSE 268* 310* 73  BUN 28* 30* 42*  CREATININE 5.57* 6.12* 8.10*  CALCIUM 8.9 8.9 9.3  PHOS  --   --  7.2*   Liver Function Tests: Recent Labs  Lab 11/07/17 1843 11/08/17 0126 11/09/17 0419  AST 18 17  --   ALT 15 14  --   ALKPHOS 77 70  --   BILITOT 1.3* 1.1  --   PROT 7.7 6.8  --   ALBUMIN 4.0 3.2* 3.2*   Recent Labs  Lab 11/08/17 0126  LIPASE 29   CBC: Recent Labs  Lab 11/07/17 1843 11/08/17 0449 11/09/17 0419  WBC 6.0 6.4 9.0  NEUTROABS 4.7  --   --   HGB 11.9* 11.1* 10.7*  HCT 37.6* 36.5* 35.1*  MCV 93.1 92.9 92.4  PLT 176 175 203   Blood Culture    Component Value Date/Time   SDES  11/07/2017 1924    BLOOD RIGHT HAND Performed at [facility], [address], [place], [place] [address]    SPECREQUEST  11/07/2017 1924    BOTTLES DRAWN AEROBIC AND ANAEROBIC Blood Culture adequate volume Performed at [facility], [address] [place], [place] [address]    CULT  11/07/2017 1924    NO GROWTH 2 DAYS Performed at [facility], [address], [place], [place] [address]    REPTSTATUS  PENDING 11/07/2017 1924    Cardiac Enzymes: Recent Labs  Lab 11/08/17 0126 11/08/17 0449 11/08/17 1337  TROPONINI 0.09* 0.09* 0.07*   CBG: Recent Labs  Lab 11/09/17 1210 11/09/17 1658 11/09/17 2127 11/10/17 0833 11/10/17 1007  GLUCAP 92 91 105* 55* 90   Iron Studies: No results for input(s): IRON, TIBC, TRANSFERRIN, FERRITIN in the last 72 hours. @lablastinr3 @ Studies/Results: No results found. Medications: . cefTRIAXone (ROCEPHIN)  IV 2 g (11/08/17 1851)   . amLODipine  10 mg Oral Daily  . aspirin EC  81 mg Oral Daily  . atorvastatin  80 mg Oral q1800  . calcitRIOL  1.25 mcg Oral Q M,W,F-HD  . calcium acetate  2,001 mg Oral TID WC  . Chlorhexidine Gluconate Cloth  6 each Topical Q0600  . DULoxetine  30 mg Oral Daily  . heparin injection (subcutaneous)  5,000 Units Subcutaneous Q8H  . insulin aspart  0-9 Units Subcutaneous TID WC  . insulin glargine  20 Units Subcutaneous BID  . levothyroxine  75 mcg Oral QAC breakfast  . metoCLOPramide (REGLAN) injection  10  mg Intravenous Q8H  . metoprolol tartrate  50 mg Oral BID  . multivitamin  1 tablet Oral QHS  . mycophenolate  250 mg Oral BID  . pregabalin  75 mg Oral Daily     HD orders: [facility] MWF 4.5 hrs 180NRe 450/800 119 kg 2.0 K/2.25 Ca UFP 4 -Heparin 4000 units IV TIW -Parsabiv 5 mg IV TIW (Not on hospital formulary) -Calcitriol 1.25 mcg PO TIW -Mircera 60 mcg IV q 2 weeks (last dose 11/07/17)  Assessment/Plan: 1. right LE cellulitis related to prior wound-IV Vanc/Rocephin started  2. Nausea and vomiting -hx of gastroparesis - meds per primary.  3. ESRD- MWF - Next HD 11/12/17 on schedule  4. Hypertension/volume- Pre wt 120. 9 Net UF 2614 Post wt 118.2 kg. Lower EDW on DC. Wanted to sign off HD early yesterday but stayed full treatment. BP elevated today although pt is euvolemia. Continue home antihypertensive meds. Add hydralazine 25 mg PO BID. Uptitrate as needed.  5. Anemia- HGB10.7 - Recent OP ESA  dose. Follow HGB.  6. Metabolic bone disease- Continue calcitriol. parsabiv not available here; continue binders 7. Nutrition- Albumin 3.2.renal/carb mod diet, vits. Add prostat and hope he tolerates without nausea. 8. Uncontrolled DM - per primary 9. CM/hx CABG- on BB and asa/statin - trop flat 10. 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 Brown NP-C 11/10/2017, 10:45 AM  [employer] [phone]  I personally saw and evaluated the patient and agree with the assessment and plan by Alonna Buckler, NP.   Estill Bakes MD [facility] Pager [phone]

## 2017-11-11 DIAGNOSIS — L089 Local infection of the skin and subcutaneous tissue, unspecified: Secondary | ICD-10-CM

## 2017-11-11 DIAGNOSIS — R7989 Other specified abnormal findings of blood chemistry: Secondary | ICD-10-CM

## 2017-11-11 DIAGNOSIS — T148XXA Other injury of unspecified body region, initial encounter: Secondary | ICD-10-CM

## 2017-11-11 LAB — GLUCOSE, CAPILLARY: GLUCOSE-CAPILLARY: 83 mg/dL (ref 70–99)

## 2017-11-11 MED ORDER — HYDRALAZINE HCL 25 MG PO TABS: 25.0000 mg | ORAL_TABLET | Freq: Two times a day (BID) | ORAL | 0 refills | Status: AC

## 2017-11-11 MED ORDER — ACETAMINOPHEN 325 MG PO TABS: 325.0000 mg | ORAL_TABLET | Freq: Four times a day (QID) | ORAL | Status: AC | PRN

## 2017-11-11 MED ORDER — HEPARIN SOD (PORK) LOCK FLUSH 100 UNIT/ML IV SOLN
500.0000 [IU] | INTRAVENOUS | Status: AC | PRN
  Administered 2017-11-11: 500 [IU]

## 2017-11-11 MED ORDER — BASAGLAR KWIKPEN 100 UNIT/ML ~~LOC~~ SOPN: 10.0000 [IU] | PEN_INJECTOR | Freq: Two times a day (BID) | SUBCUTANEOUS | Status: AC

## 2017-11-11 MED ORDER — DOXYCYCLINE HYCLATE 100 MG PO TABS: 100.0000 mg | ORAL_TABLET | Freq: Two times a day (BID) | ORAL | 0 refills | Status: AC

## 2017-11-11 MED ORDER — METOCLOPRAMIDE HCL 5 MG PO TABS: 5.0000 mg | ORAL_TABLET | Freq: Four times a day (QID) | ORAL | 0 refills | Status: AC | PRN

## 2017-11-11 MED ORDER — MUPIROCIN CALCIUM 2 % EX CREA: TOPICAL_CREAM | Freq: Four times a day (QID) | CUTANEOUS | 0 refills | Status: AC

## 2017-11-11 NOTE — Discharge Instructions (Signed)
Follow with Primary MD [name], MD in 7 days   Get CBC, CMP,  checked  by Primary MD next visit.   Activity: As tolerated with Full fall precautions use walker/cane & assistance as needed   Disposition Home    Diet: Heart Healthy , carbohydrate modified, renal diet with 1200 cc fluid restriction, with feeding assistance and aspiration precautions.   On your next visit with your primary care physician please Get Medicines reviewed and adjusted.   Please request your Prim.MD to go over all Hospital Tests and Procedure/Radiological results at the follow up, please get all Hospital records sent to your Prim MD by signing hospital release before you go home.   If you experience worsening of your admission symptoms, develop shortness of breath, life threatening emergency, suicidal or homicidal thoughts you must seek medical attention immediately by calling 911 or calling your MD immediately  if symptoms less severe.  You Must read complete instructions/literature along with all the possible adverse reactions/side effects for all the Medicines you take and that have been prescribed to you. Take any new Medicines after you have completely understood and accpet all the possible adverse reactions/side effects.   Do not drive, operating heavy machinery, perform activities at heights, swimming or participation in water activities or provide baby sitting services if your were admitted for syncope or siezures until you have seen by Primary MD or a Neurologist and advised to do so again.  Do not drive when taking Pain medications.    Do not take more than prescribed Pain, Sleep and Anxiety Medications  Special Instructions: If you have smoked or chewed Tobacco  in the last 2 yrs please stop smoking, stop any regular Alcohol  and or any Recreational drug use.  Wear Seat belts while driving.   Please note  You were cared for by a hospitalist during your hospital stay. If you have any  questions about your discharge medications or the care you received while you were in the hospital after you are discharged, you can call the unit and asked to speak with the hospitalist on call if the hospitalist that took care of you is not available. Once you are discharged, your primary care physician will handle any further medical issues. Please note that NO REFILLS for any discharge medications will be authorized once you are discharged, as it is imperative that you return to your primary care physician (or establish a relationship with a primary care physician if you do not have one) for your aftercare needs so that they can reassess your need for medications and monitor your lab values.

## 2017-11-11 NOTE — Discharge Summary (Signed)
Russell [name], is a 47 y.o. male  DOB 1970/09/03  MRN [id number].  Admission date:  11/07/2017  Admitting Physician  Eduard Clos, MD  Discharge Date:  11/11/2017   Primary MD  [name], MD  Recommendations for primary care physician for things to follow:  -Please continue insulin regimen adjustment as needed for optimal CBG control  Admission Diagnosis  Hypoxia [R09.02] Wound infection [T14.8XXA, L08.9] Elevated troponin [R79.89]   Discharge Diagnosis  Hypoxia [R09.02] Wound infection [T14.8XXA, L08.9] Elevated troponin [R79.89]    Active Problems:   Hypothyroidism (acquired)   Uncontrolled diabetes mellitus type 2 with peripheral artery disease (HCC)   Coronary artery disease involving native coronary artery of native heart without angina pectoris   S/P CABG (coronary artery bypass graft)   HTN (hypertension), malignant   Quadriparesis (HCC)   CIDP (chronic inflammatory demyelinating polyneuropathy) (HCC)   Gastroparesis due to DM (HCC)   LBBB (left bundle branch block)   Cellulitis   Cellulitis of right lower extremity   Elevated troponin      Past Medical History:  Diagnosis Date  . Anxiety   . Asthma   . Bilateral foot-drop 10/30/2017  . 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 Daily headache   . Depression   . DVT (deep venous thrombosis), H/o 01/2014-on Xarelto 03/24/2014   LLE  . ESRD (end stage renal disease) on dialysis [facility])    "[place], MWF" (06/29/2016)  . Gait abnormality 12/22/2016  . Gastroparesis 12/22/2016  . Hypertension   . Hypothyroidism   . LBBB (left bundle branch block) 09/06/2017  . Nephrotic syndrome 05/18/2014  . Neuropathy   .  Type 2 diabetes mellitus with diabetic nephropathy [facility])     Past Surgical History:  Procedure Laterality Date  . ANKLE FRACTURE SURGERY Right 1988  . AV FISTULA PLACEMENT Left 01/01/2015   Procedure: CREATION OF LEFT RADIAL CEPHALIC ARTERIOVENOUS (AV) FISTULA ;  Surgeon: Pryor Ochoa, MD;  Location: [facility] OR;  Service: Vascular;  Laterality: Left;  . BIOPSY  08/07/2017   Procedure: BIOPSY;  Surgeon: Sherrilyn Rist, MD;  Location: [facility];  Service: Gastroenterology;;  . CAPD REMOVAL N/A 05/19/2016   Procedure: PD CATH REMOVAL;  Surgeon: Abigail Miyamoto, MD;  Location: [facility] OR;  Service: General;  Laterality: N/A;  . CARDIAC CATHETERIZATION N/A 09/22/2015   Procedure: Left Heart Cath and Coronary Angiography;  Surgeon: Iran Ouch, MD;  Location: [facility] INVASIVE CV LAB;  Service: Cardiovascular;  Laterality: N/A;  . CORONARY ARTERY BYPASS GRAFT N/A 09/28/2015   Procedure: CORONARY ARTERY BYPASS GRAFTING (CABG) x 3 UTILIZING LEFT MAMMARY ARTERY AND ENDOSCOPICALLY HARVESTED LEFT GREATER SAPHENOUS VEIN.;  Surgeon: Delight Ovens, MD;  Location: [facility] OR;  Service: Open Heart Surgery;  Laterality: N/A;  . ENDOVEIN HARVEST OF GREATER SAPHENOUS VEIN  Left 09/28/2015   Procedure: ENDOVEIN HARVEST OF GREATER SAPHENOUS VEIN;  Surgeon: Delight Ovens, MD;  Location: [facility] OR;  Service: Open Heart Surgery;  Laterality: Left;  . ESOPHAGOGASTRODUODENOSCOPY N/A 03/29/2014   Procedure: ESOPHAGOGASTRODUODENOSCOPY (EGD);  Surgeon: Dorena Cookey, MD;  Location: [facility];  Service: Endoscopy;  Laterality: N/A;  . ESOPHAGOGASTRODUODENOSCOPY (EGD) WITH PROPOFOL N/A 08/07/2017   Procedure: ESOPHAGOGASTRODUODENOSCOPY (EGD) WITH PROPOFOL;  Surgeon: Sherrilyn Rist, MD;  Location: [facility] ENDOSCOPY;  Service: Gastroenterology;  Laterality: N/A;  . EYE SURGERY    . FRACTURE SURGERY    . INSERTION OF DIALYSIS CATHETER Right 01/05/2015   Procedure: INSERTION OF RIGHT INTERNAL JUGULAR DIALYSIS CATHETER;  Surgeon: Fransisco Hertz, MD;  Location: [facility] OR;  Service: Vascular;  Laterality: Right;  . IR IMAGING GUIDED PORT INSERTION  06/19/2017  . LEFT HEART CATH AND CORS/GRAFTS ANGIOGRAPHY N/A 09/13/2016   Procedure: LEFT HEART CATH AND CORS/GRAFTS ANGIOGRAPHY;  Surgeon: Kathleene Hazel, MD;  Location: [facility] INVASIVE CV LAB;  Service: Cardiovascular;  Laterality: N/A;  . RETINAL LASER PROCEDURE Bilateral   . RIGHT/LEFT HEART CATH AND CORONARY/GRAFT ANGIOGRAPHY N/A 03/06/2017   Procedure: RIGHT/LEFT HEART CATH AND CORONARY/GRAFT ANGIOGRAPHY;  Surgeon: Marykay Lex, MD;  Location: [facility] INVASIVE CV LAB;  Service: Cardiovascular;  Laterality: N/A;  . TEE WITHOUT CARDIOVERSION N/A 09/28/2015   Procedure: TRANSESOPHAGEAL ECHOCARDIOGRAM (TEE);  Surgeon: Delight Ovens, MD;  Location: [facility] OR;  Service: Open Heart Surgery;  Laterality: N/A;  . TONSILLECTOMY AND ADENOIDECTOMY  1970s       History of present illness and  Hospital Course:     Kindly see H&P for history of present illness and admission details, please review complete Labs, Consult reports and Test reports for all details in brief  HPI  from the history and physical done on the day of admission 11/07/2017  HPI: Russell [name] is a 47 y.o. male with history of diabetes mellitus type 2 on insulin, ESRD on hemodialysis Monday Wednesday and Friday, CIDP, CAD status post CABG, hypertension, hypothyroidism, gastroparesis hurt his leg 2 weeks ago on his right anterior shin and since then has been having increasing swelling erythema and had gone to his PCP and was prescribed antibiotics which he has been taking for almost 2 weeks despite which patient is erythema has worsened with increasing discharge from the wound and pain.  Patient did have his dialysis done today but still feels mildly short of breath off and on on lying down for last 2 to 3 weeks.  Denies any chest pain.  ED Course: In the ER on exam patient has erythema of the right lower extremity on the  anterior shin with wound.  Swelling extends from the anterior shin to the foot but no restriction of his ankle or knee joints.  CT scan shows features concerning for cellulitis with no definite abscess collection.  Cultures were obtained and started on empiric antibiotics and admitted for further management.  Patient did require some oxygen due to hypoxia and chest x-ray shows congestion concerning for fluid overload.  Patient did have some nausea vomiting after pain medication.  Denies any abdominal pain.  Hospital Course   47 y.o.malewithhistory of diabetes mellitus type 2 on insulin, ESRD on hemodialysis Monday Wednesday and Friday, CIDP, CAD status post CABG, hypertension, hypothyroidism, gastroparesis hurt his leg 2 weeks ago on his right anterior shin and since then has been having increasing swelling erythema and had gone to his PCP and was prescribed antibiotics  which he has been taking for almost 2 weeks despite which patient is erythema has worsened with increasing discharge from the wound and pain.   Right lower extremity cellulitis -CT scan does not show any definite collection of fluid. Patient states he does have discharge.  Patient did have a recent ABI done on November 01, 2017 which showed normal toe brachial index of both lower extremities.  -This is more a wound with surrounding cellulitis, as he bumped his leg into an object, and then he started to have the wound with surrounding infection,  - was empirically on vancomycin and Zosyn, he is afebrile, no leukocytosis, so vancomycin has been stopped on day 2, he was kept in Rocephin during hospital stay, and Bactroban ointment, no discharge from his wound, he will be discharged on another 7 days of oral doxycycline and Bactrim ointment topical .  Hypertension  -elevated, on  home medication include metoprolol, Norvasc, started on oral hydralazine by renal, blood pressure better controlled after starting hydralazine  Hypoxia -   likely from mild fluid overload with history of ESRD and systolic heart failure last EF measured was 45 to 50% with grade 2 diastolic dysfunction in September 2019. Fluid management per nephrology. -Resolved, currently on room air on discharge  Elevated troponin  -Patient with known history of CAD, status post CABG, but currently he denies any chest pain  -Troponins are chronically elevated, most likely in ESRD, non-ACS pattern  Diabetes mellitus type 2  - on Lantus insulin 30 units twice daily which was confirmed with patient.     Oral intake was unpredictable, she kept having some hypoglycemic episode, his insulin has been lowered to Lantus 10 units twice daily time of discharge, to continue with insulin sliding scale, . -HGb A1c is 9.6  CIDP  - on CellCept and IVIG. Patient is due for his IVIG today, he discussed with his home care, they will reschedule for Monday.  Nausea and vomiting  -Abdominal exam is benign, denies any abdominal pain, KUB was obtained which was negative, this is secondary to gastroparesis, worsening further nausea or vomiting today, eager to go home today, his Reglan has been changed to PRN basis.  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  Hypothyroidism on Synthroid.    Discharge Condition:  Stable  Follow UP  Follow-up Information    [name], MD Follow up.   Specialty:  Family Medicine Contact information: [address] [place] [address] [phone]             Discharge Instructions  and  Discharge Medications     Discharge Instructions    Discharge instructions   Complete by:  As directed    Follow with Primary MD [name], MD in 7 days   Get CBC, CMP,  checked  by Primary MD next visit.   Activity: As tolerated with Full fall precautions use walker/cane & assistance as needed   Disposition Home    Diet: Heart Healthy , carbohydrate modified,  renal diet with 1200 cc fluid restriction, with feeding assistance and aspiration precautions.   On your next visit with your primary care physician please Get Medicines reviewed and adjusted.   Please request your Prim.MD to go over all Hospital Tests and Procedure/Radiological results at the follow up, please get all Hospital records sent to your Prim MD by signing hospital release before you go home.   If you experience worsening of your  admission symptoms, develop shortness of breath, life threatening emergency, suicidal or homicidal thoughts you must seek medical attention immediately by calling 911 or calling your MD immediately  if symptoms less severe.  You Must read complete instructions/literature along with all the possible adverse reactions/side effects for all the Medicines you take and that have been prescribed to you. Take any new Medicines after you have completely understood and accpet all the possible adverse reactions/side effects.   Do not drive, operating heavy machinery, perform activities at heights, swimming or participation in water activities or provide baby sitting services if your were admitted for syncope or siezures until you have seen by Primary MD or a Neurologist and advised to do so again.  Do not drive when taking Pain medications.    Do not take more than prescribed Pain, Sleep and Anxiety Medications  Special Instructions: If you have smoked or chewed Tobacco  in the last 2 yrs please stop smoking, stop any regular Alcohol  and or any Recreational drug use.  Wear Seat belts while driving.   Please note  You were cared for by a hospitalist during your hospital stay. If you have any questions about your discharge medications or the care you received while you were in the hospital after you are discharged, you can call the unit and asked to speak with the hospitalist on call if the hospitalist that took care of you is not available. Once you are  discharged, your primary care physician will handle any further medical issues. Please note that NO REFILLS for any discharge medications will be authorized once you are discharged, as it is imperative that you return to your primary care physician (or establish a relationship with a primary care physician if you do not have one) for your aftercare needs so that they can reassess your need for medications and monitor your lab values.   Increase activity slowly   Complete by:  As directed      Allergies as of 11/11/2017      Reactions   Nsaids Other (See Comments)   Told to avoid all nsaids due to kidney disease    Tape Other (See Comments)   Welts result, if left for a long amount of time      Medication List    STOP taking these medications   amoxicillin-clavulanate 500-125 MG tablet Commonly known as:  AUGMENTIN   DULoxetine 30 MG capsule Commonly known as:  CYMBALTA     TAKE these medications   acetaminophen 325 MG tablet Commonly known as:  TYLENOL Take 1-2 tablets (325-650 mg total) by mouth every 6 (six) hours as needed for mild pain. What changed:  when to take this   albuterol 108 (90 Base) MCG/ACT inhaler Commonly known as:  PROVENTIL HFA;VENTOLIN HFA Inhale 1 puff into the lungs every 6 (six) hours as needed for wheezing or shortness of breath. What changed:    how much to take  when to take this   amLODipine 10 MG tablet Commonly known as:  NORVASC Take 1 tablet (10 mg total) by mouth daily.   aspirin 81 MG EC tablet Take 1 tablet (81 mg total) by mouth daily.   atorvastatin 80 MG tablet Commonly known as:  LIPITOR Take 1 tablet (80 mg total) by mouth daily at 6 PM.   BASAGLAR KWIKPEN 100 UNIT/ML Sopn Inject 0.1 mLs (10 Units total) into the skin 2 (two) times daily. What changed:  how much to take   calcitRIOL 0.25  MCG capsule Commonly known as:  ROCALTROL Take 5 capsules (1.25 mcg total) by mouth every Monday, Wednesday, and Friday with  hemodialysis.   calcium acetate 667 MG capsule Commonly known as:  PHOSLO Take 2,001 mg by mouth 3 (three) times daily with meals.   cyclobenzaprine 10 MG tablet Commonly known as:  FLEXERIL Take 10 mg by mouth 2 (two) times daily as needed for muscle spasms.   doxycycline 100 MG tablet Commonly known as:  VIBRA-TABS Take 1 tablet (100 mg total) by mouth 2 (two) times daily.   hydrALAZINE 25 MG tablet Commonly known as:  APRESOLINE Take 1 tablet (25 mg total) by mouth 2 (two) times daily.   HYDROcodone-acetaminophen 5-325 MG tablet Commonly known as:  NORCO/VICODIN Take 1 tablet by mouth every 12 (twelve) hours as needed for severe pain. What changed:  when to take this   insulin aspart 100 UNIT/ML FlexPen Commonly known as:  NOVOLOG Inject 8-20 Units into the skin 3 (three) times daily with meals. What changed:    how much to take  additional instructions   levothyroxine 75 MCG tablet Commonly known as:  SYNTHROID, LEVOTHROID Take 1 tablet (75 mcg total) by mouth daily before breakfast.   metoCLOPramide 5 MG tablet Commonly known as:  REGLAN Take 1 tablet (5 mg total) by mouth every 6 (six) hours as needed for nausea or vomiting. What changed:    how much to take  when to take this  reasons to take this   metoprolol tartrate 50 MG tablet Commonly known as:  LOPRESSOR Take 1 tablet (50 mg total) by mouth 2 (two) times daily.   mupirocin cream 2 % Commonly known as:  BACTROBAN Apply topically 4 (four) times daily. Apply to right leg wound TID   mycophenolate 250 MG capsule Commonly known as:  CELLCEPT Take 1 capsule (250 mg total) by mouth 2 (two) times daily.   Pen Needles 32G X 4 MM Misc 1 Units by Does not apply route 6 (six) times daily. As needed and as directed by physician   pregabalin 75 MG capsule Commonly known as:  LYRICA Take 1 capsule (75 mg total) by mouth daily.   PRIVIGEN 40 GM/400ML Soln Generic drug:  Immune Globulin (Human) Inject  every 21 Days.         Diet and Activity recommendation: See Discharge Instructions above   Consults obtained -  Renal   Major procedures and Radiology Reports - PLEASE review detailed and final reports for all details, in brief -      Dg Chest 2 View  Result Date: 11/07/2017 CLINICAL DATA:  Asthma.  Cough.  Diabetic. EXAM: CHEST - 2 VIEW COMPARISON:  09/05/2017 FINDINGS: Both views are degraded by patient size and technique. Midline trachea. Cardiomegaly accentuated by AP portable technique. No pleural effusion or pneumothorax. Pulmonary interstitial prominence is moderate, greater on the left. Is accentuated by low lung volumes. Prior median sternotomy. IMPRESSION: Cardiomegaly with pulmonary interstitial prominence, suspicious for pulmonary venous congestion. No overt congestive failure. Electronically Signed   By: Jeronimo Greaves M.D.   On: 11/07/2017 20:23   Dg Abd 1 View  Result Date: 11/08/2017 CLINICAL DATA:  Nausea and vomiting EXAM: ABDOMEN - 1 VIEW COMPARISON:  09/05/2017 abdominal CT FINDINGS: Normal bowel gas pattern. No abnormal stool retention. No concerning mass effect or calcification. No significant osseous finding. IMPRESSION: Normal bowel gas pattern. Electronically Signed   By: Marnee Spring M.D.   On: 11/08/2017 08:32   Ct Tibia  Fibula Right W Contrast  Result Date: 11/07/2017 CLINICAL DATA:  RIGHT lower extremity puncture wound 2 weeks ago. 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 EXAM: CT OF THE LOWER RIGHT EXTREMITY WITH CONTRAST TECHNIQUE: Multidetector CT imaging of the lower right extremity (lower femur through hindfoot) was performed according to the standard protocol following intravenous contrast administration. COMPARISON:  RIGHT tibia and fibular radiographs August 29, 2016 CONTRAST:  ISOVUE-300 IOPAMIDOL (ISOVUE-300) INJECTION 61% FINDINGS: Bones/Joint/Cartilage No fracture or dislocation. No destructive bony lesions. Mild  knee capsular calcification. Joint spaces maintained. 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 Moderate talar navicular osteoarthrosis. Ligaments Suboptimally assessed by CT. Muscles and Tendons Not suspicious. Soft tissues Minimal skin irregularity mid to distal anterior tibia, possible laceration. Generalized subcutaneous edema and trace effusion without focal fluid collection, subcutaneous gas or radiopaque foreign bodies. Severe vascular calcifications and extensive varicosities. Medial proximal tibial vascular clips. IMPRESSION: 1. Lower extremity interstitial edema and/or cellulitis. No subcutaneous gas or abscess. No CT findings of osteomyelitis. 2. Extensive varicosities, potential thrombophlebitis. 3. Advanced degenerative change of the ankle without fracture or dislocation. 4. Severe vascular calcifications. Electronically Signed   By: Awilda Metro M.D.   On: 11/07/2017 20:17   Vas [place] Vanice Sarah With/wo Tbi  Result Date: 11/01/2017 A LOWER EXTREMITY DOPPLER STUDY Indications: Patient presents today with complaints of worsening painful              numbness, stabbing and thrombing sensations to both feet. He denies              any claudication symptoms. He states he takes Lyrica daily that              helps with his symptoms. If he walks too much during the day by the              time he gets home to rest he begins to feel the numbness, thrombing              and stabbing sensations in his feet. Symptoms are worse at night. High Risk Factors: Hypertension, hyperlipidemia, Diabetes, no history of                    smoking, coronary artery disease.  Vascular Interventions: CABG on 09/28/2015. Performing Technologist: Tyna Jaksch RVT  Examination Guidelines: A complete evaluation includes at minimum, Doppler waveform signals and systolic blood pressure reading at the level of bilateral brachial, anterior tibial, and posterior tibial  arteries, when vessel segments are accessible. Bilateral testing is considered an integral part of a complete examination. Photoelectric Plethysmograph (PPG) waveforms and toe systolic pressure readings are included as required and additional duplex testing as needed. Limited examinations for reoccurring indications may be performed as noted.  ABI Findings: +---------+------------------+-----+---------+---------------------------------+ Right    Rt Pressure (mmHg)IndexWaveform Comment                           +---------+------------------+-----+---------+---------------------------------+ Brachial 155                                                               +---------+------------------+-----+---------+---------------------------------+ ATA      254  1.64 triphasic                                  +---------+------------------+-----+---------+---------------------------------+ PTA      254               1.64 triphasic                                  +---------+------------------+-----+---------+---------------------------------+ PERO                                     inaudible (difficulty innosating                                           possibly due to hardening of                                               skin)                             +---------+------------------+-----+---------+---------------------------------+ Great Toe190               1.23 Normal                                     +---------+------------------+-----+---------+---------------------------------+ +---------+------------------+-----+---------+---------------------------------+ Left     Lt Pressure (mmHg)IndexWaveform Comment                           +---------+------------------+-----+---------+---------------------------------+ Brachial AVF                                                                +---------+------------------+-----+---------+---------------------------------+ ATA      254               1.64 triphasic                                  +---------+------------------+-----+---------+---------------------------------+ PTA      254               1.64 triphasic                                  +---------+------------------+-----+---------+---------------------------------+ PERO                                     inaudible (difficulty innosating  possibly due to hardening of                                               skin)                             +---------+------------------+-----+---------+---------------------------------+ Great Toe200               1.29 Normal                                     +---------+------------------+-----+---------+---------------------------------+ +-------+-----------+-----------+------------+------------+ ABI/TBIToday's ABIToday's TBIPrevious ABIPrevious TBI +-------+-----------+-----------+------------+------------+ Right  [place]         1.23       [place]          [place]           +-------+-----------+-----------+------------+------------+ Left   [place]         1.29       [place]          [place]           +-------+-----------+-----------+------------+------------+ Right ABIs appear essentially unchanged compared to prior study on 11/16/2016. Left ABIs appear essentially unchanged compared to prior study on 11/16/2016.  Summary: Right: Resting right ankle-brachial index indicates noncompressible right lower extremity arteries.The right toe-brachial index is normal. Left: Resting left ankle-brachial index indicates noncompressible left lower extremity arteries.The left toe-brachial index is normal.  *See table(s) above for measurements and observations.     Preliminary  A LOWER EXTREMITY DOPPLER STUDY Indications: Patient presents today with complaints of worsening painful               numbness, stabbing and throbbing sensations to both feet. He denies              any claudication symptoms. He states he takes Lyrica daily that              helps with his symptoms. If he walks too much during the day by the              time he gets home to rest he begins to feel the numbness, throbbing              and stabbing sensations in his feet. Symptoms are worse at night. High Risk Factors: Hypertension, hyperlipidemia, Diabetes, no history of                    smoking, coronary artery disease.  Vascular Interventions: CABG on 09/28/2015. Performing Technologist: Tyna Jaksch RVT  Examination Guidelines: A complete evaluation includes at minimum, Doppler waveform signals and systolic blood pressure reading at the level of bilateral brachial, anterior tibial, and posterior tibial arteries, when vessel segments are accessible. Bilateral testing is considered an integral part of a complete examination. Photoelectric Plethysmograph (PPG) waveforms and toe systolic pressure readings are included as required and additional duplex testing as needed. Limited examinations for reoccurring indications may be performed as noted.  ABI Findings: +---------+------------------+-----+---------+---------------------------------+ Right    Rt Pressure (mmHg)IndexWaveform Comment                           +---------+------------------+-----+---------+---------------------------------+ Brachial 155                                                               +---------+------------------+-----+---------+---------------------------------+  ATA      254               1.64 triphasic                                  +---------+------------------+-----+---------+---------------------------------+ PTA      254               1.64 triphasic                                  +---------+------------------+-----+---------+---------------------------------+ PERO                                     inaudible  (difficulty insonating                                           possibly due to hardening of                                               skin)                             +---------+------------------+-----+---------+---------------------------------+ Great Toe190               1.23 Normal                                     +---------+------------------+-----+---------+---------------------------------+ +---------+------------------+-----+---------+---------------------------------+ Left     Lt Pressure (mmHg)IndexWaveform Comment                           +---------+------------------+-----+---------+---------------------------------+ Brachial AVF                                                               +---------+------------------+-----+---------+---------------------------------+ ATA      254               1.64 triphasic                                  +---------+------------------+-----+---------+---------------------------------+ PTA      254               1.64 triphasic                                  +---------+------------------+-----+---------+---------------------------------+ PERO                                     inaudible (difficulty insonating  possibly due to hardening of                                               skin)                             +---------+------------------+-----+---------+---------------------------------+ Great Toe200               1.29 Normal                                     +---------+------------------+-----+---------+---------------------------------+ +-------+-----------+-----------+------------+------------+ ABI/TBIToday's ABIToday's TBIPrevious ABIPrevious TBI +-------+-----------+-----------+------------+------------+ Right  [place]         1.23       [place]          [place]           +-------+-----------+-----------+------------+------------+  Left   [place]         1.29       [place]          [place]           +-------+-----------+-----------+------------+------------+ Right ABIs appear essentially unchanged compared to prior study on 11/16/2016. Left ABIs appear essentially unchanged compared to prior study on 11/16/2016.  Summary: Right: Resting right ankle-brachial index indicates noncompressible right lower extremity arteries. The right toe-brachial index is normal. Left: Resting left ankle-brachial index indicates noncompressible left lower extremity arteries. The left toe-brachial index is normal.  *See table(s) above for measurements and observations.     Preliminary  A LOWER EXTREMITY DOPPLER STUDY Indications: In 11/2016, an arterial Doppler performed at Cardiovascular at              [facility] showed an ABI of non comppressible vessels,              bilaterally. Patient presents today with complaints of worsening              painful numbness, stabbing and throbbing sensations to both feet.              He denies any claudication symptoms. He states he takes Lyrica              daily that helps with his symptoms. If he walks too much during the              day by the time he gets home to rest he begins to feel the              numbness, throbbing and stabbing sensations in his feet. Symptoms              are worse at night. High Risk Factors: Hypertension, hyperlipidemia, Diabetes, no history of                    smoking, coronary artery disease.  Vascular Interventions: CABG on 09/28/2015. Performing Technologist: Tyna Jaksch RVT  Examination Guidelines: A complete evaluation includes at minimum, Doppler waveform signals and systolic blood pressure reading at the level of bilateral brachial, anterior tibial, and posterior tibial arteries, when vessel segments are accessible. Bilateral testing is considered an integral part of a complete examination. Photoelectric Plethysmograph (PPG) waveforms and toe systolic pressure readings are included as  required and additional duplex testing as needed.  Limited examinations for reoccurring indications may be performed as noted.  ABI Findings: +---------+------------------+-----+---------+---------------------------------+ Right    Rt Pressure (mmHg)IndexWaveform Comment                           +---------+------------------+-----+---------+---------------------------------+ Brachial 155                                                               +---------+------------------+-----+---------+---------------------------------+ ATA      254               1.64 triphasic                                  +---------+------------------+-----+---------+---------------------------------+ PTA      254               1.64 triphasic                                  +---------+------------------+-----+---------+---------------------------------+ PERO                                     inaudible (difficulty insonating                                           possibly due to hardening of                                               skin)                             +---------+------------------+-----+---------+---------------------------------+ Great Toe190               1.23 Normal                                     +---------+------------------+-----+---------+---------------------------------+ +---------+------------------+-----+---------+---------------------------------+ Left     Lt Pressure (mmHg)IndexWaveform Comment                           +---------+------------------+-----+---------+---------------------------------+ Brachial AVF                                                               +---------+------------------+-----+---------+---------------------------------+ ATA      254               1.64 triphasic                                  +---------+------------------+-----+---------+---------------------------------+  PTA      254                1.64 triphasic                                  +---------+------------------+-----+---------+---------------------------------+ PERO                                     inaudible (difficulty insonating                                           possibly due to hardening of                                               skin)                             +---------+------------------+-----+---------+---------------------------------+ Great Toe200               1.29 Normal                                     +---------+------------------+-----+---------+---------------------------------+ +-------+-----------+-----------+------------+------------+ ABI/TBIToday's ABIToday's TBIPrevious ABIPrevious TBI +-------+-----------+-----------+------------+------------+ Right  [place]         1.23       [place]          [place]           +-------+-----------+-----------+------------+------------+ Left   [place]         1.29       [place]                     +-------+-----------+-----------+------------+------------+  Right ABIs appear essentially unchanged compared to prior study on 11/16/2016. Left ABIs appear essentially unchanged compared to prior study on 11/16/2016.  Summary: Right: Resting right ankle-brachial index indicates noncompressible right lower extremity arteries. The right toe-brachial index is normal. Left: Resting left ankle-brachial index indicates noncompressible left lower extremity arteries. The left toe-brachial index is normal.  *See table(s) above for measurements and observations.  Electronically signed by Julien Nordmann MD on 11/01/2017 at 9:30:41 PM.    Final     Micro Results     Recent Results (from the past 240 hour(s))  Blood Culture (routine x 2)     Status: None (Preliminary result)   Collection Time: 11/07/17  6:43 PM  Result Value Ref Range Status   Specimen Description   Final    BLOOD PORTA CATH Performed at [facility], [address] [place], [place] [address]    Special Requests   Final    BOTTLES DRAWN AEROBIC AND ANAEROBIC Blood Culture adequate volume Performed at [facility], [address] [place], [place] [address]    Culture   Final    NO GROWTH 4 DAYS Performed at [facility], [address], [place], [place] [address]    Report Status PENDING  Incomplete  Blood Culture (routine x 2)     Status: None (Preliminary  result)   Collection Time: 11/07/17  7:24 PM  Result Value Ref Range Status   Specimen Description   Final    BLOOD RIGHT HAND Performed at [facility], [address], [place], [place] [address]    Special Requests   Final    BOTTLES DRAWN AEROBIC AND ANAEROBIC Blood Culture adequate volume Performed at [facility], [address], [place], [place] [address]    Culture   Final    NO GROWTH 4 DAYS Performed at [facility], [address] [place], [place] [address]    Report Status PENDING  Incomplete  MRSA PCR Screening     Status: None   Collection Time: 11/10/17  4:55 AM  Result Value Ref Range Status   MRSA by PCR NEGATIVE NEGATIVE Final    Comment:        The GeneXpert MRSA Assay (FDA approved for NASAL specimens only), is one component of a comprehensive MRSA colonization surveillance program. It is not intended to diagnose MRSA infection nor to guide or monitor treatment for MRSA infections. Performed at [facility], [address], [place], [place] [address]        Today   Subjective:   Dairon Procter today has no headache,no chest abdominal pain, he denies any further nausea or vomiting, you are to go home today   Objective:   Blood pressure (!) 145/95, pulse 83, temperature 97.6 F (36.4 C), temperature source Oral, resp. rate 16, height 6\' 2"  (1.88 m), weight 119.4 kg, SpO2 94 %.   Intake/Output Summary (Last 24 hours) at 11/11/2017 1112 Last data filed at 11/10/2017 1459 Gross  per 24 hour  Intake 300 ml  Output 1 ml  Net 299 ml    Exam Awake Alert, Oriented x 3, No new F.N deficits, Normal affect Symmetrical Chest wall movement, Good air movement bilaterally, CTAB RRR,No Gallops,Rubs or new Murmurs, No Parasternal Heave +ve B.Sounds, Abd Soft, Non tender, No rebound -guarding or rigidity. Right anterior shin wound with surrounding erythema, erythema significantly subsided, no discharge,  Data Review   CBC w Diff:  Lab Results  Component Value Date   WBC 9.0 11/09/2017   HGB 10.7 (L) 11/09/2017   HGB 10.7 (L) 10/30/2017   HCT 35.1 (L) 11/09/2017   HCT 33.4 (L) 10/30/2017   PLT 203 11/09/2017   PLT 231 10/30/2017   LYMPHOPCT 7 11/07/2017   MONOPCT 11 11/07/2017   EOSPCT 2 11/07/2017   BASOPCT 1 11/07/2017    CMP:  Lab Results  Component Value Date   NA 137 11/09/2017   NA 138 10/30/2017   K 3.7 11/09/2017   CL 95 (L) 11/09/2017   CO2 28 11/09/2017   BUN 42 (H) 11/09/2017   BUN 42 (H) 10/30/2017   CREATININE 8.10 (H) 11/09/2017   CREATININE 1.56 (H) 05/26/2014   GLU 286 12/14/2014   PROT 6.8 11/08/2017   PROT 7.0 10/30/2017   ALBUMIN 3.2 (L) 11/09/2017   ALBUMIN 4.2 10/30/2017   BILITOT 1.1 11/08/2017   BILITOT 0.6 10/30/2017   ALKPHOS 70 11/08/2017   AST 17 11/08/2017   ALT 14 11/08/2017  .   Total Time in preparing paper work, data evaluation and todays exam - 35 minutes  Huey Bienenstock M.D on 11/11/2017 at 11:12 AM  [facility]   Office  [phone]

## 2017-11-11 NOTE — Progress Notes (Signed)
IV team deaccessed port of cath. Pt received discharge instructions with paper prescriptions. Pt understood well. Pt took his all belongings. HS [employer]

## 2017-11-11 NOTE — Progress Notes (Addendum)
[facility] Progress Note   Subjective: Feels better. DC home today.   Objective Vitals:   11/10/17 1127 11/10/17 1505 11/10/17 2103 11/11/17 0529  BP: (!) 164/103 136/81 111/75   Pulse: 89 99 96   Resp: 16 20 16    Temp: 98.2 F (36.8 C) 98.9 F (37.2 C) 97.6 F (36.4 C)   TempSrc: Oral  Oral   SpO2: 94% 100% 94%   Weight:    119.4 kg  Height:       Physical Exam General:Disheveled appearing male in NAD Heart:S1,S2 RRR Lungs:CTAB Abdomen:Obese, active BS Extremities:Trace pedal edema. Erythema noted R shin. Seems to be improving.Feet discolored, several toes with eschar on tips of toes bilaterally. Dialysis Access:L AVF + bruit    Additional Objective Labs: Basic Metabolic Panel: Recent Labs  Lab 11/07/17 1843 11/08/17 0126 11/09/17 0419  NA 136 135 137  K 3.8 4.3 3.7  CL 93* 94* 95*  CO2 28 28 28   GLUCOSE 268* 310* 73  BUN 28* 30* 42*  CREATININE 5.57* 6.12* 8.10*  CALCIUM 8.9 8.9 9.3  PHOS  --   --  7.2*   Liver Function Tests: Recent Labs  Lab 11/07/17 1843 11/08/17 0126 11/09/17 0419  AST 18 17  --   ALT 15 14  --   ALKPHOS 77 70  --   BILITOT 1.3* 1.1  --   PROT 7.7 6.8  --   ALBUMIN 4.0 3.2* 3.2*   Recent Labs  Lab 11/08/17 0126  LIPASE 29   CBC: Recent Labs  Lab 11/07/17 1843 11/08/17 0449 11/09/17 0419  WBC 6.0 6.4 9.0  NEUTROABS 4.7  --   --   HGB 11.9* 11.1* 10.7*  HCT 37.6* 36.5* 35.1*  MCV 93.1 92.9 92.4  PLT 176 175 203   Blood Culture    Component Value Date/Time   SDES  11/07/2017 1924    BLOOD RIGHT HAND Performed at [facility], [address], [place], [place] [address]    SPECREQUEST  11/07/2017 1924    BOTTLES DRAWN AEROBIC AND ANAEROBIC Blood Culture adequate volume Performed at [facility], [address], [place], [place] [address]    CULT  11/07/2017 1924    NO GROWTH 3 DAYS Performed at [facility], [address], [place],  [place] [address]    REPTSTATUS PENDING 11/07/2017 1924    Cardiac Enzymes: Recent Labs  Lab 11/08/17 0126 11/08/17 0449 11/08/17 1337  TROPONINI 0.09* 0.09* 0.07*   CBG: Recent Labs  Lab 11/10/17 0833 11/10/17 1007 11/10/17 1248 11/10/17 1816 11/10/17 2057  GLUCAP 55* 90 89 109* 186*   Iron Studies: No results for input(s): IRON, TIBC, TRANSFERRIN, FERRITIN in the last 72 hours. @lablastinr3 @ Studies/Results: No results found. Medications: . cefTRIAXone (ROCEPHIN)  IV 2 g (11/10/17 1739)   . amLODipine  10 mg Oral Daily  . aspirin EC  81 mg Oral Daily  . atorvastatin  80 mg Oral q1800  . calcitRIOL  1.25 mcg Oral Q M,W,F-HD  . calcium acetate  2,001 mg Oral TID WC  . Chlorhexidine Gluconate Cloth  6 each Topical Q0600  . DULoxetine  30 mg Oral Daily  . heparin injection (subcutaneous)  5,000 Units Subcutaneous Q8H  . hydrALAZINE  25 mg Oral BID  . insulin aspart  0-9 Units Subcutaneous TID WC  . insulin glargine  10 Units Subcutaneous BID  . levothyroxine  75 mcg Oral QAC breakfast  . metoCLOPramide (REGLAN) injection  10 mg Intravenous Q8H  . metoprolol tartrate  50 mg Oral BID  . multivitamin  1 tablet Oral QHS  . mupirocin cream   Topical QID  . mycophenolate  250 mg Oral BID  . pregabalin  75 mg Oral Daily     HD orders: [facility] MWF 4.5 hrs 180NRe 450/800 119 kg 2.0 K/2.25 Ca UFP 4 -Heparin 4000 units IV TIW -Parsabiv 5 mg IV TIW (Not on hospital formulary) -Calcitriol 1.25 mcg PO TIW -Mircera 60 mcg IV q 2 weeks (last dose 11/07/17)  Assessment/Plan: 1. right LE cellulitis related to prior wound-Per primary. Vanc stopped, DC home on oral doxycycline. WBC 9.0.  2. Nausea and vomiting -hx of gastroparesis - Renal dosed reglan PRN per primary.  3. ESRD- MWF - Next HD 11/12/17 on scheduleat OP clinic 4. Hypertension/volume-. Better control of BP today after adding hydralazine. Continue home antihypertensive meds. Post wt 118.2 11/09/17. Will lower OP EDW  to 118 kg.  5. Anemia-HGB10.7- Recent OP ESA dose. Follow HGB. 6. Metabolic bone disease- Continue calcitriol. parsabiv not available here; continue binders 7. Nutrition- Albumin 3.2.renal/carb mod diet, vits. Add prostat and hope he tolerates without nausea. 8. DM - per primary BS 83 this AM. Better control.  9. CM/hx CABG- on BB and asa/statin - trop flat 10. 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 Brown NP-C 11/11/2017, 8:40 AM  [employer] [phone]  I personally saw and evaluated the patient and agree with the assessment and plan by Alonna Buckler, NP.   Estill Bakes MD [facility] Pager [phone]

## 2017-11-11 NOTE — Care Management (Signed)
Pt states he has [facility] to provide home health services.  Per Vaughan Basta, [facility] provides his IVIG and [facility] provides his nursing services.  No new orders needed.

## 2017-11-12 DIAGNOSIS — N186 End stage renal disease: Secondary | ICD-10-CM | POA: Diagnosis not present

## 2017-11-12 DIAGNOSIS — E1129 Type 2 diabetes mellitus with other diabetic kidney complication: Secondary | ICD-10-CM | POA: Diagnosis not present

## 2017-11-12 DIAGNOSIS — N2581 Secondary hyperparathyroidism of renal origin: Secondary | ICD-10-CM | POA: Diagnosis not present

## 2017-11-12 DIAGNOSIS — D631 Anemia in chronic kidney disease: Secondary | ICD-10-CM | POA: Diagnosis not present

## 2017-11-12 LAB — CULTURE, BLOOD (ROUTINE X 2)
CULTURE: NO GROWTH
Culture: NO GROWTH
SPECIAL REQUESTS: ADEQUATE
Special Requests: ADEQUATE

## 2017-11-13 DIAGNOSIS — H4311 Vitreous hemorrhage, right eye: Secondary | ICD-10-CM | POA: Diagnosis not present

## 2017-11-13 DIAGNOSIS — E113593 Type 2 diabetes mellitus with proliferative diabetic retinopathy without macular edema, bilateral: Secondary | ICD-10-CM | POA: Diagnosis not present

## 2017-11-14 DIAGNOSIS — D631 Anemia in chronic kidney disease: Secondary | ICD-10-CM | POA: Diagnosis not present

## 2017-11-14 DIAGNOSIS — N2581 Secondary hyperparathyroidism of renal origin: Secondary | ICD-10-CM | POA: Diagnosis not present

## 2017-11-14 DIAGNOSIS — E1129 Type 2 diabetes mellitus with other diabetic kidney complication: Secondary | ICD-10-CM | POA: Diagnosis not present

## 2017-11-14 DIAGNOSIS — N186 End stage renal disease: Secondary | ICD-10-CM | POA: Diagnosis not present

## 2017-11-15 DIAGNOSIS — E103592 Type 1 diabetes mellitus with proliferative diabetic retinopathy without macular edema, left eye: Secondary | ICD-10-CM | POA: Diagnosis not present

## 2017-11-15 DIAGNOSIS — H4311 Vitreous hemorrhage, right eye: Secondary | ICD-10-CM | POA: Diagnosis not present

## 2017-11-16 DIAGNOSIS — E113591 Type 2 diabetes mellitus with proliferative diabetic retinopathy without macular edema, right eye: Secondary | ICD-10-CM | POA: Diagnosis not present

## 2017-11-17 DIAGNOSIS — N2581 Secondary hyperparathyroidism of renal origin: Secondary | ICD-10-CM | POA: Diagnosis not present

## 2017-11-17 DIAGNOSIS — E1129 Type 2 diabetes mellitus with other diabetic kidney complication: Secondary | ICD-10-CM | POA: Diagnosis not present

## 2017-11-17 DIAGNOSIS — D631 Anemia in chronic kidney disease: Secondary | ICD-10-CM | POA: Diagnosis not present

## 2017-11-17 DIAGNOSIS — N186 End stage renal disease: Secondary | ICD-10-CM | POA: Diagnosis not present

## 2017-11-19 DIAGNOSIS — N2581 Secondary hyperparathyroidism of renal origin: Secondary | ICD-10-CM | POA: Diagnosis not present

## 2017-11-19 DIAGNOSIS — N186 End stage renal disease: Secondary | ICD-10-CM | POA: Diagnosis not present

## 2017-11-19 DIAGNOSIS — E1129 Type 2 diabetes mellitus with other diabetic kidney complication: Secondary | ICD-10-CM | POA: Diagnosis not present

## 2017-11-19 DIAGNOSIS — D631 Anemia in chronic kidney disease: Secondary | ICD-10-CM | POA: Diagnosis not present

## 2017-11-21 DIAGNOSIS — N2581 Secondary hyperparathyroidism of renal origin: Secondary | ICD-10-CM | POA: Diagnosis not present

## 2017-11-21 DIAGNOSIS — N186 End stage renal disease: Secondary | ICD-10-CM | POA: Diagnosis not present

## 2017-11-21 DIAGNOSIS — D631 Anemia in chronic kidney disease: Secondary | ICD-10-CM | POA: Diagnosis not present

## 2017-11-21 DIAGNOSIS — E1129 Type 2 diabetes mellitus with other diabetic kidney complication: Secondary | ICD-10-CM | POA: Diagnosis not present

## 2017-11-22 ENCOUNTER — Ambulatory Visit: Payer: Medicare Other | Admitting: Internal Medicine

## 2017-11-23 DIAGNOSIS — E1129 Type 2 diabetes mellitus with other diabetic kidney complication: Secondary | ICD-10-CM | POA: Diagnosis not present

## 2017-11-23 DIAGNOSIS — N186 End stage renal disease: Secondary | ICD-10-CM | POA: Diagnosis not present

## 2017-11-23 DIAGNOSIS — D631 Anemia in chronic kidney disease: Secondary | ICD-10-CM | POA: Diagnosis not present

## 2017-11-23 DIAGNOSIS — N2581 Secondary hyperparathyroidism of renal origin: Secondary | ICD-10-CM | POA: Diagnosis not present

## 2017-11-26 DIAGNOSIS — N2581 Secondary hyperparathyroidism of renal origin: Secondary | ICD-10-CM | POA: Diagnosis not present

## 2017-11-26 DIAGNOSIS — E1129 Type 2 diabetes mellitus with other diabetic kidney complication: Secondary | ICD-10-CM | POA: Diagnosis not present

## 2017-11-26 DIAGNOSIS — N186 End stage renal disease: Secondary | ICD-10-CM | POA: Diagnosis not present

## 2017-11-26 DIAGNOSIS — D631 Anemia in chronic kidney disease: Secondary | ICD-10-CM | POA: Diagnosis not present

## 2017-11-28 DIAGNOSIS — N186 End stage renal disease: Secondary | ICD-10-CM | POA: Diagnosis not present

## 2017-11-28 DIAGNOSIS — N2581 Secondary hyperparathyroidism of renal origin: Secondary | ICD-10-CM | POA: Diagnosis not present

## 2017-11-28 DIAGNOSIS — E1129 Type 2 diabetes mellitus with other diabetic kidney complication: Secondary | ICD-10-CM | POA: Diagnosis not present

## 2017-11-28 DIAGNOSIS — D631 Anemia in chronic kidney disease: Secondary | ICD-10-CM | POA: Diagnosis not present

## 2017-11-30 DIAGNOSIS — N2581 Secondary hyperparathyroidism of renal origin: Secondary | ICD-10-CM | POA: Diagnosis not present

## 2017-11-30 DIAGNOSIS — E1129 Type 2 diabetes mellitus with other diabetic kidney complication: Secondary | ICD-10-CM | POA: Diagnosis not present

## 2017-11-30 DIAGNOSIS — N186 End stage renal disease: Secondary | ICD-10-CM | POA: Diagnosis not present

## 2017-11-30 DIAGNOSIS — D631 Anemia in chronic kidney disease: Secondary | ICD-10-CM | POA: Diagnosis not present

## 2017-12-02 DIAGNOSIS — Z992 Dependence on renal dialysis: Secondary | ICD-10-CM | POA: Diagnosis not present

## 2017-12-02 DIAGNOSIS — N186 End stage renal disease: Secondary | ICD-10-CM | POA: Diagnosis not present

## 2017-12-02 DIAGNOSIS — N049 Nephrotic syndrome with unspecified morphologic changes: Secondary | ICD-10-CM | POA: Diagnosis not present

## 2017-12-03 DIAGNOSIS — D631 Anemia in chronic kidney disease: Secondary | ICD-10-CM | POA: Diagnosis not present

## 2017-12-03 DIAGNOSIS — N2581 Secondary hyperparathyroidism of renal origin: Secondary | ICD-10-CM | POA: Diagnosis not present

## 2017-12-03 DIAGNOSIS — N186 End stage renal disease: Secondary | ICD-10-CM | POA: Diagnosis not present

## 2017-12-05 DIAGNOSIS — N2581 Secondary hyperparathyroidism of renal origin: Secondary | ICD-10-CM | POA: Diagnosis not present

## 2017-12-05 DIAGNOSIS — E113593 Type 2 diabetes mellitus with proliferative diabetic retinopathy without macular edema, bilateral: Secondary | ICD-10-CM | POA: Diagnosis not present

## 2017-12-05 DIAGNOSIS — D631 Anemia in chronic kidney disease: Secondary | ICD-10-CM | POA: Diagnosis not present

## 2017-12-05 DIAGNOSIS — N186 End stage renal disease: Secondary | ICD-10-CM | POA: Diagnosis not present

## 2017-12-06 ENCOUNTER — Encounter (HOSPITAL_BASED_OUTPATIENT_CLINIC_OR_DEPARTMENT_OTHER): Payer: Medicare Other | Attending: Internal Medicine

## 2017-12-06 DIAGNOSIS — E1151 Type 2 diabetes mellitus with diabetic peripheral angiopathy without gangrene: Secondary | ICD-10-CM | POA: Diagnosis not present

## 2017-12-06 DIAGNOSIS — E1136 Type 2 diabetes mellitus with diabetic cataract: Secondary | ICD-10-CM | POA: Insufficient documentation

## 2017-12-06 DIAGNOSIS — Z951 Presence of aortocoronary bypass graft: Secondary | ICD-10-CM | POA: Insufficient documentation

## 2017-12-06 DIAGNOSIS — E11622 Type 2 diabetes mellitus with other skin ulcer: Secondary | ICD-10-CM | POA: Insufficient documentation

## 2017-12-06 DIAGNOSIS — X58XXXA Exposure to other specified factors, initial encounter: Secondary | ICD-10-CM | POA: Diagnosis not present

## 2017-12-06 DIAGNOSIS — L97812 Non-pressure chronic ulcer of other part of right lower leg with fat layer exposed: Secondary | ICD-10-CM | POA: Diagnosis not present

## 2017-12-06 DIAGNOSIS — E1142 Type 2 diabetes mellitus with diabetic polyneuropathy: Secondary | ICD-10-CM | POA: Diagnosis not present

## 2017-12-06 DIAGNOSIS — Z992 Dependence on renal dialysis: Secondary | ICD-10-CM | POA: Insufficient documentation

## 2017-12-06 DIAGNOSIS — Z794 Long term (current) use of insulin: Secondary | ICD-10-CM | POA: Insufficient documentation

## 2017-12-06 DIAGNOSIS — I120 Hypertensive chronic kidney disease with stage 5 chronic kidney disease or end stage renal disease: Secondary | ICD-10-CM | POA: Diagnosis not present

## 2017-12-06 DIAGNOSIS — J449 Chronic obstructive pulmonary disease, unspecified: Secondary | ICD-10-CM | POA: Diagnosis not present

## 2017-12-06 DIAGNOSIS — N186 End stage renal disease: Secondary | ICD-10-CM | POA: Diagnosis not present

## 2017-12-06 DIAGNOSIS — Z86718 Personal history of other venous thrombosis and embolism: Secondary | ICD-10-CM | POA: Diagnosis not present

## 2017-12-06 DIAGNOSIS — E1122 Type 2 diabetes mellitus with diabetic chronic kidney disease: Secondary | ICD-10-CM | POA: Diagnosis not present

## 2017-12-06 DIAGNOSIS — S61401A Unspecified open wound of right hand, initial encounter: Secondary | ICD-10-CM | POA: Insufficient documentation

## 2017-12-06 DIAGNOSIS — L97829 Non-pressure chronic ulcer of other part of left lower leg with unspecified severity: Secondary | ICD-10-CM | POA: Diagnosis not present

## 2017-12-06 DIAGNOSIS — S61200A Unspecified open wound of right index finger without damage to nail, initial encounter: Secondary | ICD-10-CM | POA: Diagnosis not present

## 2017-12-06 DIAGNOSIS — I2510 Atherosclerotic heart disease of native coronary artery without angina pectoris: Secondary | ICD-10-CM | POA: Insufficient documentation

## 2017-12-06 DIAGNOSIS — S81802A Unspecified open wound, left lower leg, initial encounter: Secondary | ICD-10-CM | POA: Diagnosis not present

## 2017-12-07 DIAGNOSIS — N2581 Secondary hyperparathyroidism of renal origin: Secondary | ICD-10-CM | POA: Diagnosis not present

## 2017-12-07 DIAGNOSIS — N186 End stage renal disease: Secondary | ICD-10-CM | POA: Diagnosis not present

## 2017-12-07 DIAGNOSIS — D631 Anemia in chronic kidney disease: Secondary | ICD-10-CM | POA: Diagnosis not present

## 2017-12-09 ENCOUNTER — Telehealth: Payer: Self-pay | Admitting: Family Medicine

## 2017-12-10 NOTE — Telephone Encounter (Signed)
Thank you for getting such detail.  I also spoke to [facility] StocknerSun 12/8 at 3:50pm, will complete death certificate when I am in office Tues 12/10

## 2017-12-11 NOTE — Telephone Encounter (Signed)
Family member sister in law [place]  calling about death certificate  Please call the [facility] at [phone]

## 2017-12-12 NOTE — Telephone Encounter (Signed)
Completed and ready for pickup 

## 2017-12-20 NOTE — Progress Notes (Deleted)
HPI: FU CAD. Renal Dopplers May 2016 normal. Carotid Dopplers September 2017 showed 1-39% bilateral stenosis. Patient had coronary artery bypass and graft 9/17 with a LIMA to LAD, saphenous vein graft to the diagonal and saphenous vein graft to the obtuse marginal.  Follow-up echocardiogram March 2019 showed ejection fraction 25 to 30%, mild to moderate mitral regurgitation, mild biatrial enlargement and mild right ventricular enlargement.  Cardiac catheterization March 2019 showed 80% proximal LAD, 99% first diagonal, 95% ramus, normal right coronary artery, patent LIMA to LAD, patent saphenous vein graft to first diagonal and patent saphenous vein graft to ramus.  Ejection fraction 25 to 35%.  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  Patient treated medically.  Follow-up echocardiogram September 2019 showed ejection fraction 45 to 50%, moderate diastolic dysfunction, moderate left atrial enlargement.  ABIs October 2019 showed noncompressible right and left lower extremity arteries.  Since last seen    Current Outpatient Medications  Medication Sig Dispense Refill  . acetaminophen (TYLENOL) 325 MG tablet Take 1-2 tablets (325-650 mg total) by mouth every 6 (six) hours as needed for mild pain.    Marland Kitchen albuterol (PROVENTIL HFA;VENTOLIN HFA) 108 (90 Base) MCG/ACT inhaler Inhale 1 puff into the lungs every 6 (six) hours as needed for wheezing or shortness of breath. (Patient taking differently: Inhale 2 puffs into the lungs every 8 (eight) hours as needed for wheezing or shortness of breath. ) 1 Inhaler 11  . amLODipine (NORVASC) 10 MG tablet Take 1 tablet (10 mg total) by mouth daily. 30 tablet 5  . aspirin EC 81 MG EC tablet Take 1 tablet (81 mg total) by mouth daily.    Marland Kitchen atorvastatin (LIPITOR) 80 MG tablet Take 1 tablet (80 mg total) by mouth daily at 6 PM. 90 tablet 3  . calcitRIOL (ROCALTROL) 0.25 MCG capsule Take 5 capsules (1.25 mcg total) by mouth every  Monday, Wednesday, and Friday with hemodialysis.    Marland Kitchen calcium acetate (PHOSLO) 667 MG capsule Take 2,001 mg by mouth 3 (three) times daily with meals.    . cyclobenzaprine (FLEXERIL) 10 MG tablet Take 10 mg by mouth 2 (two) times daily as needed for muscle spasms.    Marland Kitchen doxycycline (VIBRA-TABS) 100 MG tablet Take 1 tablet (100 mg total) by mouth 2 (two) times daily. 14 tablet 0  . hydrALAZINE (APRESOLINE) 25 MG tablet Take 1 tablet (25 mg total) by mouth 2 (two) times daily. 60 tablet 0  . HYDROcodone-acetaminophen (NORCO/VICODIN) 5-325 MG tablet Take 1 tablet by mouth every 12 (twelve) hours as needed for severe pain. (Patient taking differently: Take 1 tablet by mouth every 6 (six) hours as needed for severe pain. ) 10 tablet 0  . insulin aspart (NOVOLOG FLEXPEN) 100 UNIT/ML FlexPen Inject 8-20 Units into the skin 3 (three) times daily with meals. (Patient taking differently: Inject 5-10 Units into the skin 3 (three) times daily with meals. Per sliding scale) 15 mL 11  . Insulin Glargine (BASAGLAR KWIKPEN) 100 UNIT/ML SOPN Inject 0.1 mLs (10 Units total) into the skin 2 (two) times daily.    . Insulin Pen Needle (PEN NEEDLES) 32G X 4 MM MISC 1 Units by Does not apply route 6 (six) times daily. As needed and as directed by physician 540 each 4  . levothyroxine (SYNTHROID, LEVOTHROID) 75 MCG tablet Take 1 tablet (75 mcg total) by mouth daily before breakfast. 90 tablet 3  . metoCLOPramide (REGLAN) 5 MG tablet Take 1 tablet (5 mg  total) by mouth every 6 (six) hours as needed for nausea or vomiting. 60 tablet 0  . metoprolol tartrate (LOPRESSOR) 50 MG tablet Take 1 tablet (50 mg total) by mouth 2 (two) times daily. 180 tablet 3  . mupirocin cream (BACTROBAN) 2 % Apply topically 4 (four) times daily. Apply to right leg wound TID 15 g 0  . mycophenolate (CELLCEPT) 250 MG capsule Take 1 capsule (250 mg total) by mouth 2 (two) times daily. 60 capsule 5  . pregabalin (LYRICA) 75 MG capsule Take 1 capsule (75  mg total) by mouth daily. 90 capsule 1  . PRIVIGEN 40 GM/400ML SOLN Inject every 21 Days.     No current facility-administered medications for this visit.      Past Medical History:  Diagnosis Date  . Anxiety   . Asthma   . Bilateral foot-drop 10/30/2017  . 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 Daily headache   . Depression   . DVT (deep venous thrombosis), H/o 01/2014-on Xarelto 03/24/2014   LLE  . ESRD (end stage renal disease) on dialysis [facility](HCC)    "[place], MWF" (06/29/2016)  . Gait abnormality 12/22/2016  . Gastroparesis 12/22/2016  . Hypertension   . Hypothyroidism   . LBBB (left bundle branch block) 09/06/2017  . Nephrotic syndrome 05/18/2014  . Neuropathy   . Type 2 diabetes mellitus with diabetic nephropathy [facility](HCC)     Past Surgical History:  Procedure Laterality Date  . ANKLE FRACTURE SURGERY Right 1988  . AV FISTULA PLACEMENT Left 01/01/2015   Procedure: CREATION OF LEFT RADIAL CEPHALIC ARTERIOVENOUS (AV) FISTULA ;  Surgeon: Pryor OchoaJames D Lawson, MD;  [facility];  Service: Vascular;  Laterality: Left;  . BIOPSY  08/07/2017   Procedure: BIOPSY;  Surgeon: Sherrilyn Ristanis, Henry L III, MD;  [facility];  Service: Gastroenterology;;  . CAPD REMOVAL N/A 05/19/2016   Procedure: PD CATH REMOVAL;  Surgeon: Abigail MiyamotoBlackman, Douglas, MD;  [facility];  Service: General;  Laterality: N/A;  . CARDIAC CATHETERIZATION N/A 09/22/2015   Procedure: Left Heart Cath and Coronary Angiography;  Surgeon: Iran OuchMuhammad A Arida, MD;  [facility] IN[facility];  Service: Cardiovascular;  Laterality: N/A;  . CORONARY ARTERY BYPASS GRAFT N/A 09/28/2015   Procedure: CORONARY ARTERY BYPASS GRAFTING (CABG) x 3 UTILIZING LEFT MAMMARY ARTERY AND ENDOSCOPICALLY HARVESTED LEFT GREATER SAPHENOUS  VEIN.;  Surgeon: Delight OvensEdward B Gerhardt, MD;  Location: [facility] OR;  Service: Open Heart Surgery;  Laterality: N/A;  . ENDOVEIN HARVEST OF GREATER SAPHENOUS VEIN Left 09/28/2015   Procedure: ENDOVEIN HARVEST OF GREATER SAPHENOUS VEIN;  Surgeon: Delight OvensEdward B Gerhardt, MD;  [facility];  Service: Open Heart Surgery;  Laterality: Left;  . ESOPHAGOGASTRODUODENOSCOPY N/A 03/29/2014   Procedure: ESOPHAGOGASTRODUODENOSCOPY (EGD);  Surgeon: Dorena CookeyJohn Hayes, MD;  [facility];  Service: Endoscopy;  Laterality: N/A;  . ESOPHAGOGASTRODUODENOSCOPY (EGD) WITH PROPOFOL N/A 08/07/2017   Procedure: ESOPHAGOGASTRODUODENOSCOPY (EGD) WITH PROPOFOL;  Surgeon: Sherrilyn Ristanis, Henry L III, MD;  [facility];  Service: Gastroenterology;  Laterality: N/A;  . EYE SURGERY    . FRACTURE SURGERY    . INSERTION OF DIALYSIS CATHETER Right 01/05/2015   Procedure: INSERTION OF RIGHT INTERNAL JUGULAR DIALYSIS CATHETER;  Surgeon: Fransisco HertzBrian L Chen, MD;  [facility];  Service: Vascular;  Laterality: Right;  . IR IMAGING GUIDED  PORT INSERTION  06/19/2017  . LEFT HEART CATH AND CORS/GRAFTS ANGIOGRAPHY N/A 09/13/2016   Procedure: LEFT HEART CATH AND CORS/GRAFTS ANGIOGRAPHY;  Surgeon: Kathleene HazelMcAlhany, Christopher D, MD;  [facility];  Service: Cardiovascular;  Laterality: N/A;  . RETINAL LASER PROCEDURE Bilateral   . RIGHT/LEFT HEART CATH AND CORONARY/GRAFT ANGIOGRAPHY N/A 03/06/2017   Procedure: RIGHT/LEFT HEART CATH AND CORONARY/GRAFT ANGIOGRAPHY;  Surgeon: Marykay LexHarding, David W, MD;  [facility];  Service: Cardiovascular;  Laterality: N/A;  . TEE WITHOUT CARDIOVERSION N/A 09/28/2015   Procedure: TRANSESOPHAGEAL ECHOCARDIOGRAM (TEE);  Surgeon: Delight OvensEdward B Gerhardt, MD;  [facility];  Service: Open Heart Surgery;  Laterality: N/A;  . TONSILLECTOMY AND ADENOIDECTOMY  1970s    Social History   Socioeconomic History  . Marital status: Single    Spouse name: Not on file  . Number of children: 0  . Years of education:  College  . Highest education level: Not on file  Occupational History  . Occupation: Disabled    Comment: Data processing managerMaintenance mechanic  Social Needs  . Financial resource strain: Not on file  . Food insecurity:    Worry: Not on file    Inability: Not on file  . Transportation needs:    Medical: Not on file    Non-medical: Not on file  Tobacco Use  . Smoking status: Never Smoker  . Smokeless tobacco: Never Used  Substance and Sexual Activity  . Alcohol use: Yes    Alcohol/week: 0.0 standard drinks    Comment: 06/29/2016 "might have a few drinks/year"  . Drug use: No  . Sexual activity: Yes  Lifestyle  . Physical activity:    Days per week: Not on file    Minutes per session: Not on file  . Stress: Not on file  Relationships  . Social connections:    Talks on phone: Not on file    Gets together: Not on file    Attends religious service: Not on file    Active member of club or organization: Not on file    Attends meetings of clubs or organizations: Not on file    Relationship status: Not on file  . Intimate partner violence:    Fear of current or ex partner: Not on file    Emotionally abused: Not on file    Physically abused: Not on file    Forced sexual activity: Not on file  Other Topics Concern  . Not on file  Social History Narrative   Patient currently lives with his fiance   Works with facilities   Nonsmoker, nondrinker   Caffeine use: none   Right handed     Family History  Problem Relation Age of Onset  . Obesity Mother        Patient states that family members have no other medical illnesses other than what I have described  . Kidney cancer Maternal Grandmother   . Cancer Father 50       AML  . Heart disease Unknown        No family history    ROS: no fevers or chills, productive cough, hemoptysis, dysphasia, odynophagia, melena, hematochezia, dysuria, hematuria, rash, seizure activity, orthopnea, PND, pedal edema, claudication. Remaining systems are  negative.  Physical Exam: Well-developed well-nourished in no acute distress.  Skin is warm and dry.  HEENT is normal.  Neck is supple.  Chest is clear to auscultation with normal expansion.  Cardiovascular exam is regular rate and rhythm.  Abdominal exam nontender  or distended. No masses palpated. Extremities show no edema. neuro grossly intact  ECG- personally reviewed  A/P  1 coronary artery disease-patient is status post coronary artery bypass graft.  He denies chest pain.  Continue medical therapy with aspirin and statin.  2 hypertension-patient's blood pressure is controlled.  Continue present medications and follow.  3 hyperlipidemia-continue statin.  4 end-stage renal disease-followed by nephrology.  5 cardiomyopathy-LV function improved on most recent echocardiogram.  Continue beta-blocker and hydralazine.  Olga Millers, MD

## 2018-01-01 ENCOUNTER — Ambulatory Visit: Payer: Medicare Other | Admitting: Cardiology

## 2018-01-02 NOTE — Telephone Encounter (Signed)
Paramedics [facility] called regarding the patient who was having chest pains.  He was still at the scene. Mother Was having seizure like activity with his eyes rolled back in his head His mother called 911 Once paramedics arrived family was doing CPR and paramedics was at the scene aed was applied and he was shocked He was on cardiac monitor noted to be in PEA He went from PEA to V. Fib.  He was shocked then went back into PEA.  Time of death 14:36pm.   Therapist, occupationalfficer Stockner also verified if our office would complete the Death Certificate Time of death 14:36 Cause of death Cardiac Arrest

## 2018-01-02 DEATH — deceased

## 2018-01-14 ENCOUNTER — Encounter: Payer: Self-pay | Admitting: Cardiology

## 2018-01-17 ENCOUNTER — Other Ambulatory Visit: Payer: Self-pay | Admitting: Family Medicine

## 2018-01-17 NOTE — Telephone Encounter (Signed)
Requested medication (s) are due for refill today: no  Requested medication (s) are on the active medication list: no  Last refill:  10/22/2017  Future visit scheduled: no  Notes to clinic:  Did Dr Clelia Croft prescribe this medication   Requested Prescriptions  Pending Prescriptions Disp Refills   DULoxetine (CYMBALTA) 30 MG capsule [Pharmacy Med Name: DULOXETINE HCL DR 30 MG CAP] 90 capsule 1    Sig: TAKE 1 CAPSULE BY MOUTH EVERY DAY     Psychiatry: Antidepressants - SNRI Failed - 01/17/2018  3:28 AM      Failed - Last BP in normal range    BP Readings from Last 1 Encounters:  11/11/17 (!) 145/95         Passed - Valid encounter within last 6 months    Recent Outpatient Visits          2 months ago Open leg wound, right, initial encounter   [facility], Levell July, MD   7 months ago Uncontrolled diabetes mellitus type 2 with peripheral artery disease [facility])   [facility], Levell July, MD   9 months ago Uncontrolled diabetes mellitus type 2 with peripheral artery disease [facility])   [facility], Levell July, MD   1 year ago Acute pain of right lower extremity   [facility], Levell July, MD   1 year ago Shortness of breath   [facility] at [facility], Levell July, MD

## 2018-03-07 ENCOUNTER — Ambulatory Visit: Payer: Medicare Other | Admitting: Internal Medicine

## 2018-05-23 ENCOUNTER — Ambulatory Visit: Payer: Medicare Other | Admitting: Neurology

## 2018-12-30 NOTE — Telephone Encounter (Signed)
Entered in error

## 2019-02-10 IMAGING — CR DG CHEST 2V
2 series · 2 of 2 positions shown · non-contrast
Comparison: 05/16/2016

CLINICAL DATA: Left-sided chest pain with shortness of breath

EXAM:
CHEST  2 VIEW

[chest lat]
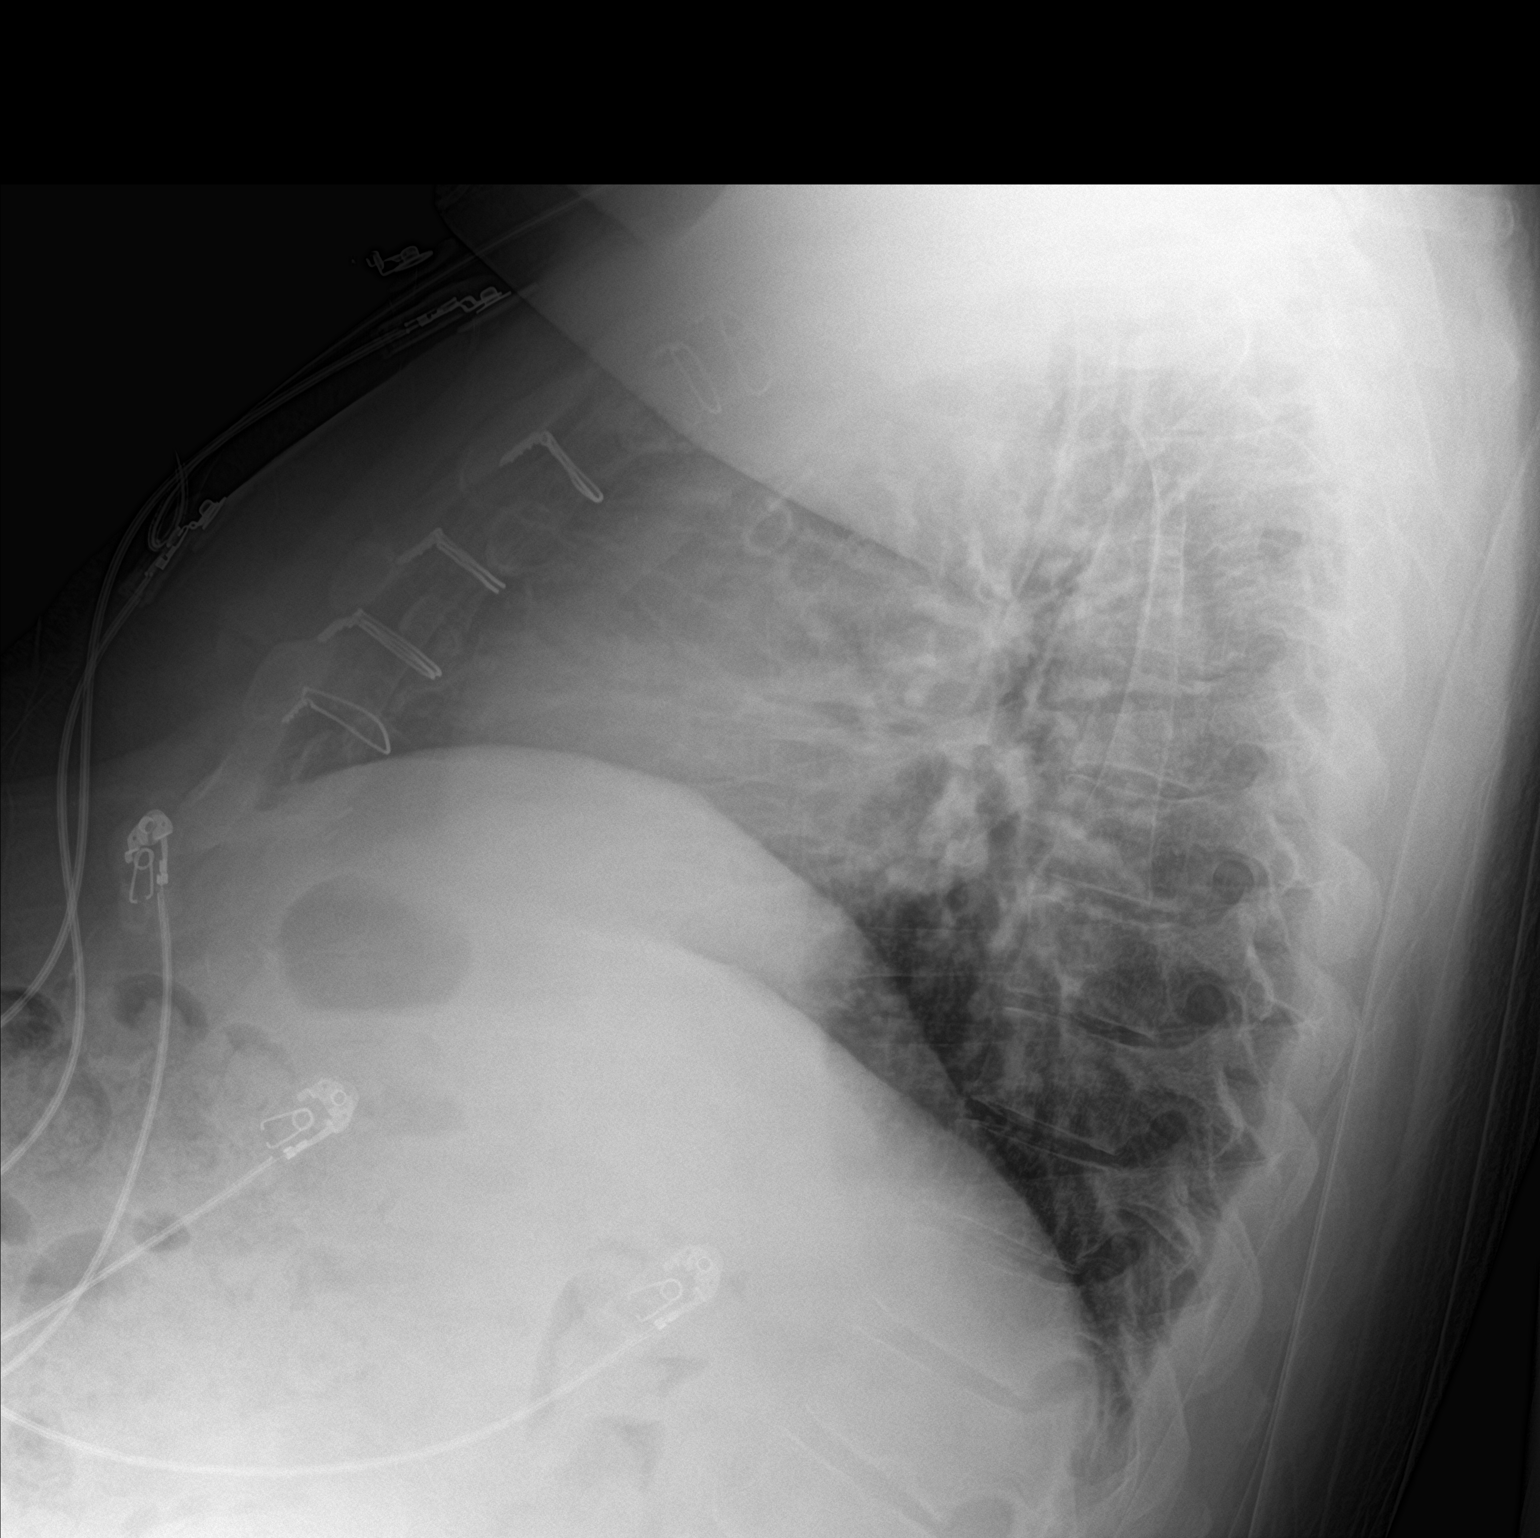

[chest ap]
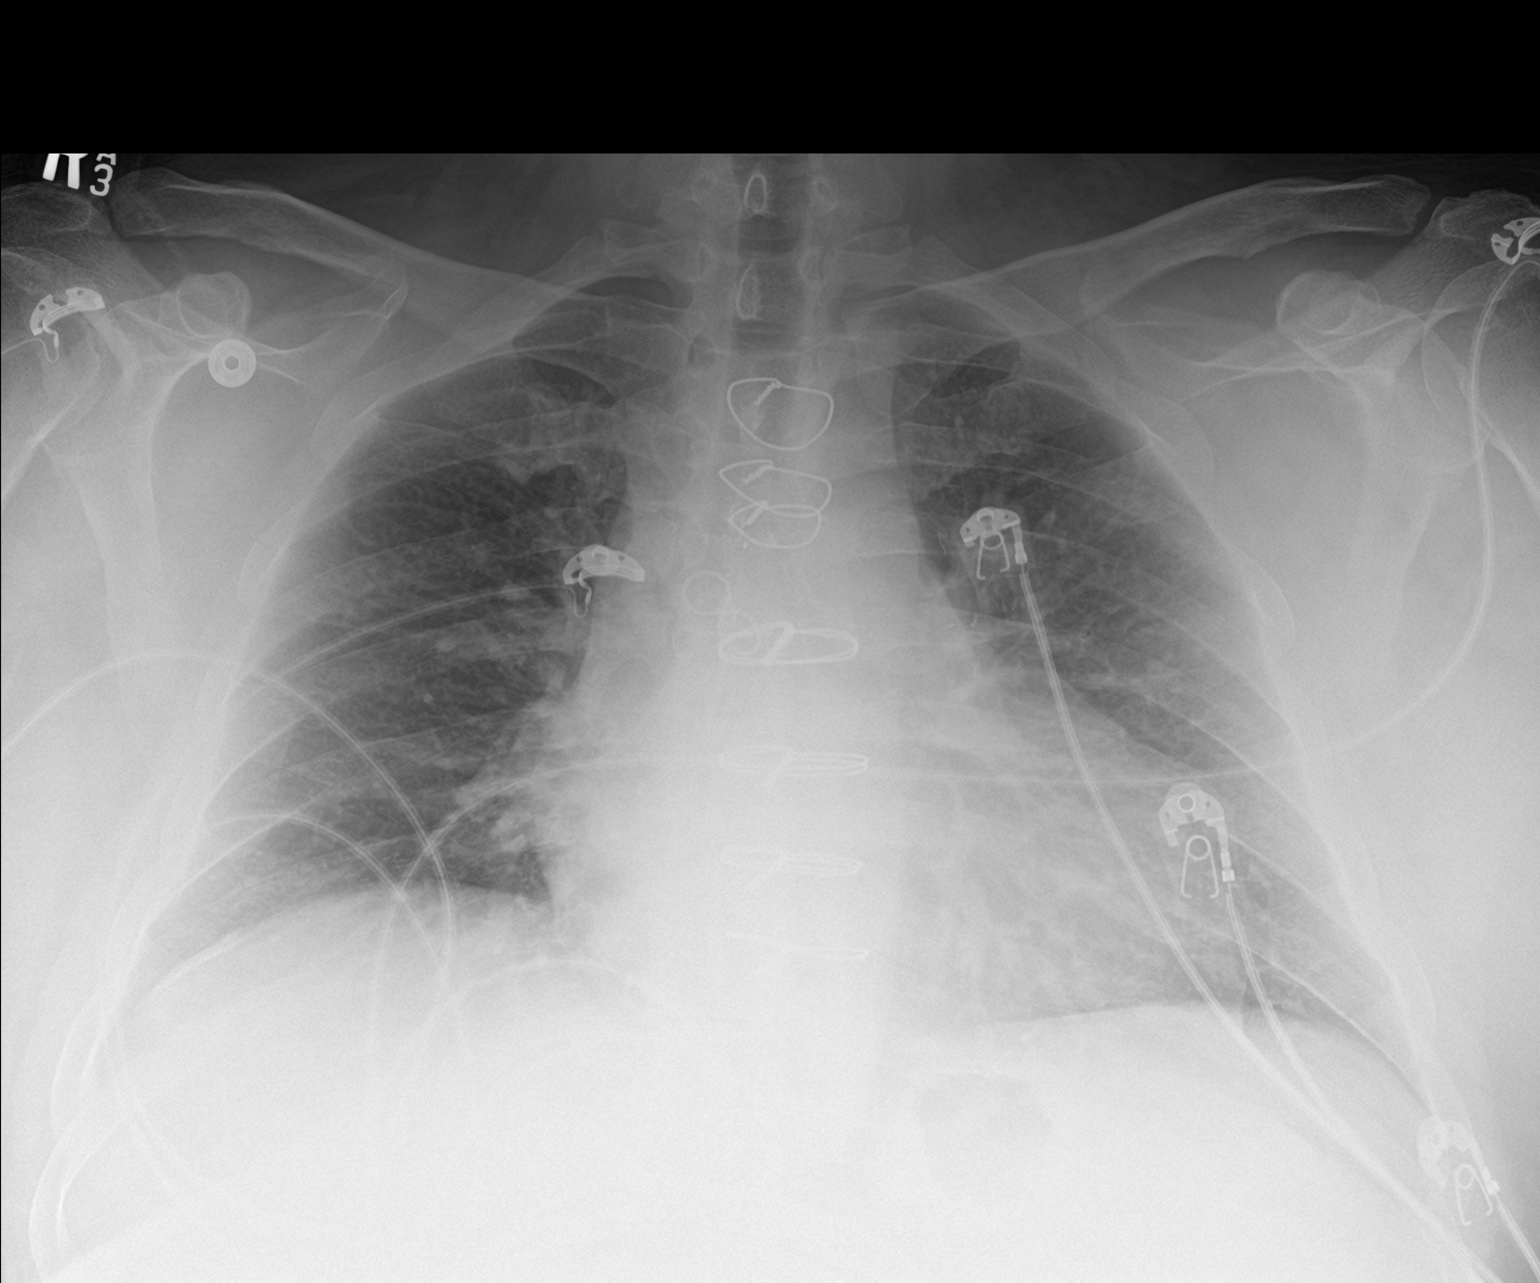

[2 of 2 positions shown; findings below may reference images not displayed]

FINDINGS: Post sternotomy changes. Low lung volumes. No focal consolidation or
effusion. Stable cardiomegaly with mild central congestion. No
pneumothorax.
IMPRESSION: 1. Cardiomegaly with mild central congestion
2. Low lung volume.  No edema or infiltrate.

## 2019-10-27 IMAGING — XA DG FLUORO GUIDE LUMBAR PUNCTURE
2 series · 2 of 2 positions shown · non-contrast
Comparison: none

CLINICAL DATA: Possible CIPD.

[Series 3: ortho adipose · 1 of 1 slices shown (1 of 2)]
[im 1/1]
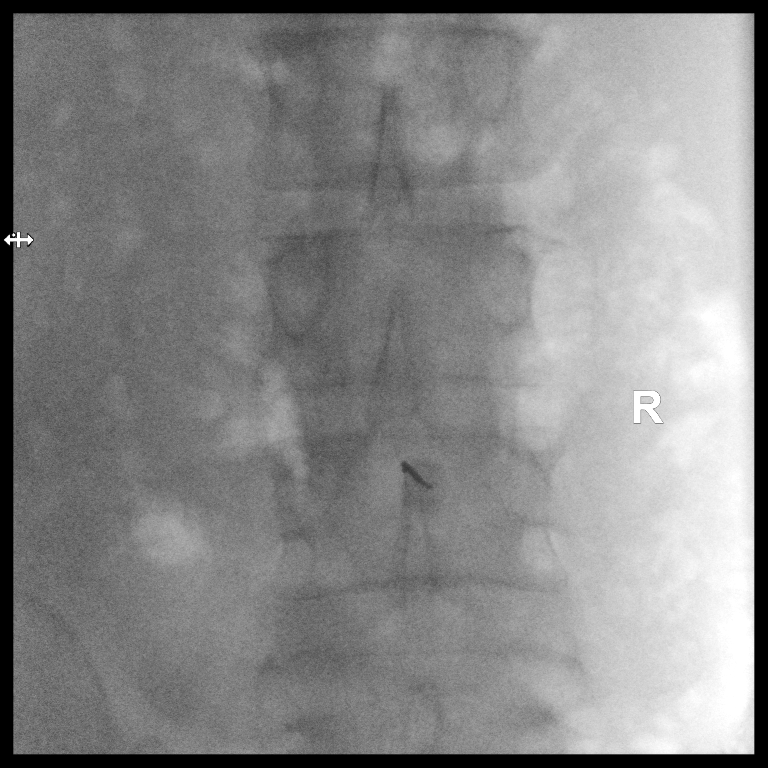

[Series 4: ortho adipose · 1 of 1 slices shown (2 of 2)]
[im 1/1]
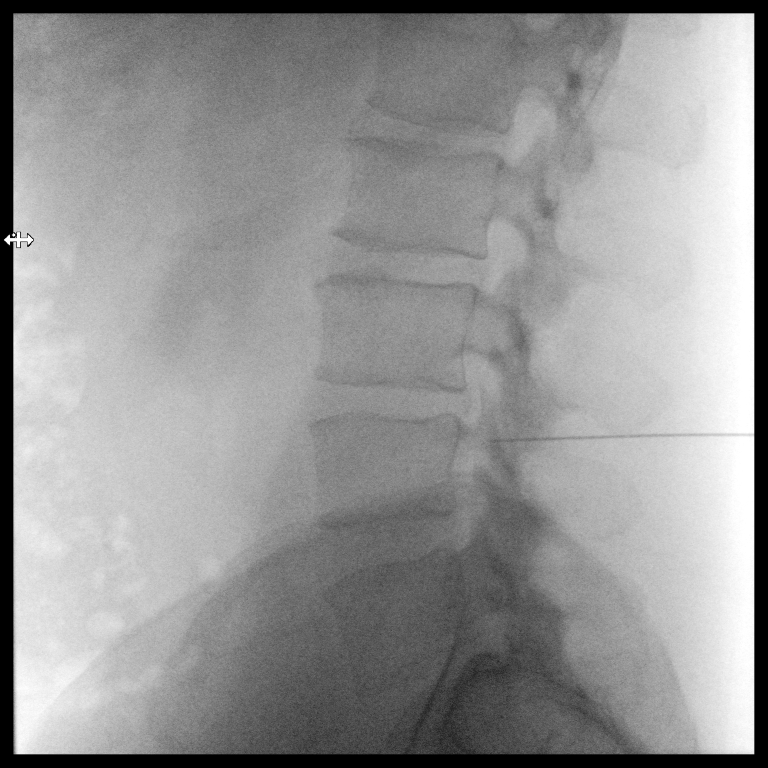

[2 of 2 positions shown; findings below may reference images not displayed]

EXAM:
DIAGNOSTIC LUMBAR PUNCTURE UNDER FLUOROSCOPIC GUIDANCE

FLUOROSCOPY TIME:  20 seconds corresponding to a Dose Area Product
of 250.04 ?Gy*m2

PROCEDURE:
Informed consent was obtained from the patient prior to the
procedure, including potential complications of headache, allergy,
and pain. Time-out was performed.

With the patient lateral decubitus, the lower back was prepped with
Betadine. 1% Lidocaine was used for local anesthesia.

Lumbar puncture was performed at the L3-L4 level using a 20 gauge
needle with return of clear CSF with an opening pressure of 24 cm
water. Closing pressure was 22 cm water. 13.0 ml of CSF were
obtained for laboratory studies. The patient tolerated the procedure
well and there were no apparent complications.
IMPRESSION: Technically successful diagnostic lumbar puncture with clear fluid.
Elevated opening pressure of 24 cm water.

## 2020-02-07 IMAGING — CR DG CHEST 1V PORT
1 series · 1 of 1 positions shown · non-contrast
Comparison: 03/19/2017

CLINICAL DATA: Nausea and vomiting since yesterday. Dialysis
patient.

EXAM:
PORTABLE CHEST 1 VIEW

[AP]
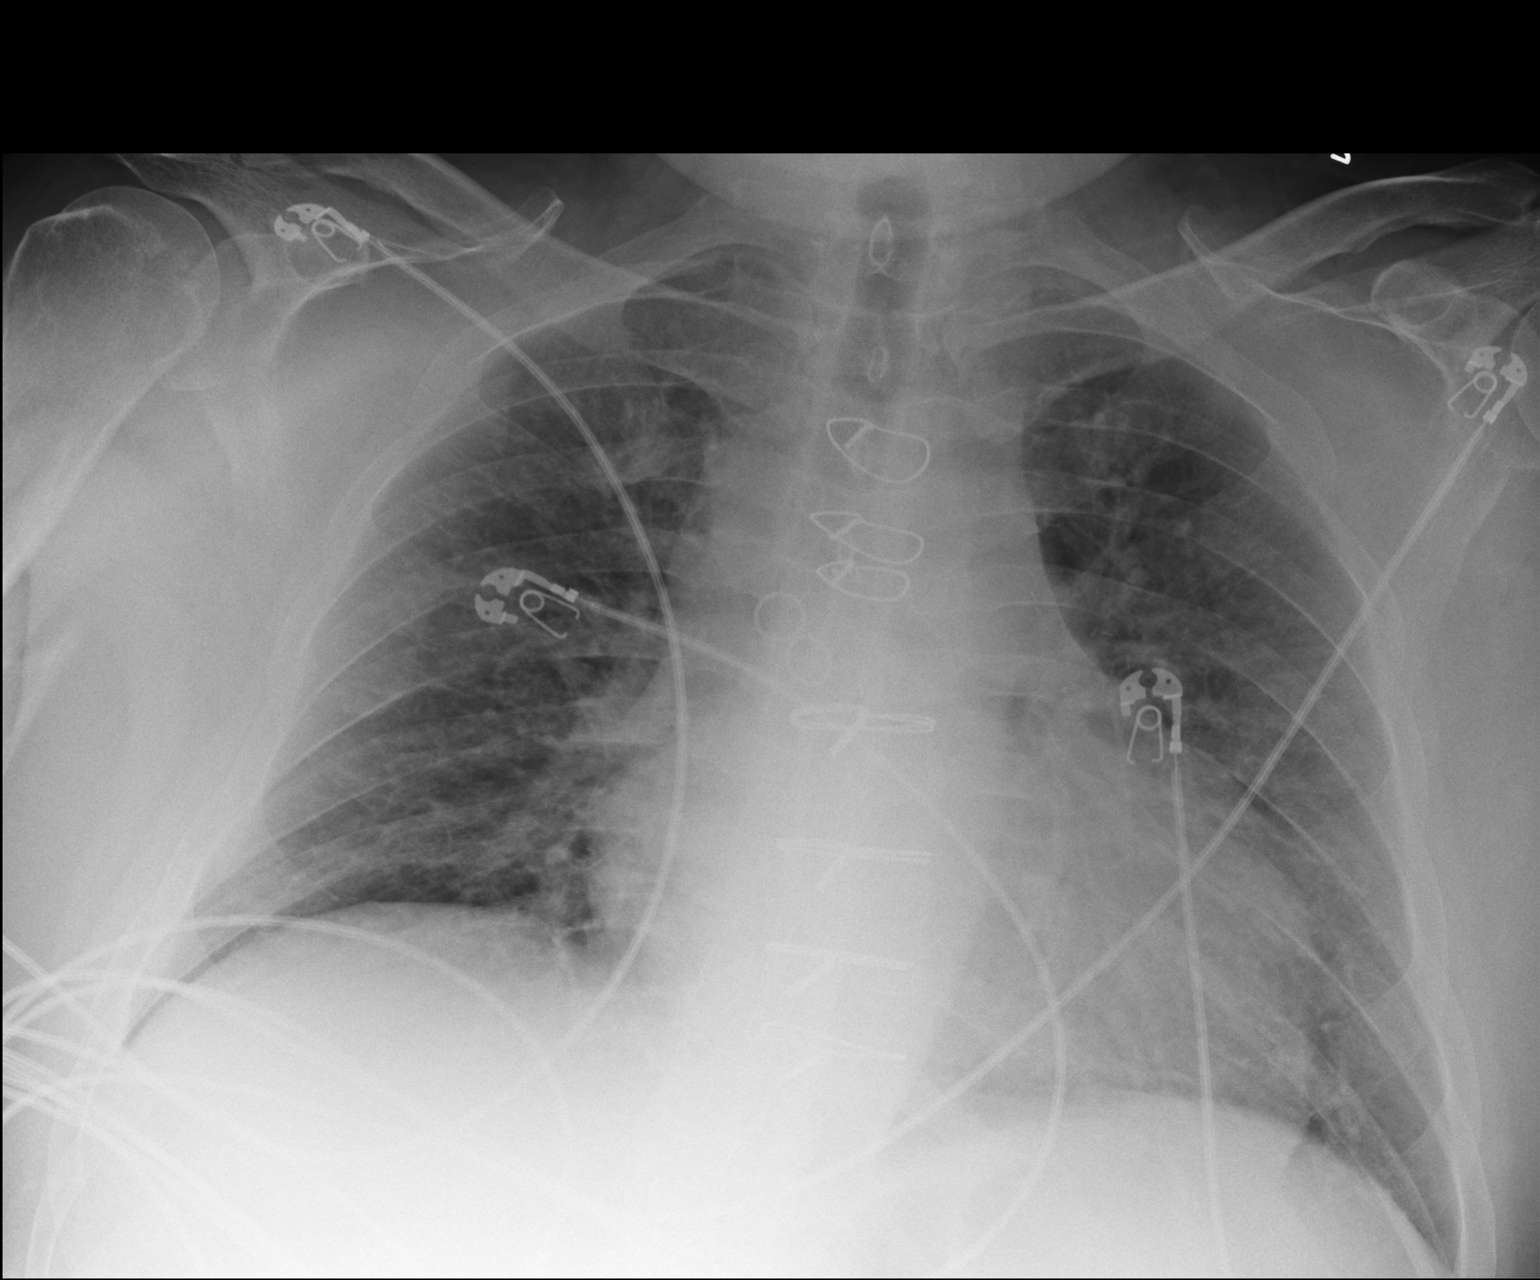

[1 of 1 positions shown; findings below may reference images not displayed]

FINDINGS: Sternotomy wires are unchanged. Lungs are adequately inflated
without focal airspace consolidation or effusion. Minimal prominence
of the central perihilar markings which may indicate a mild degree
of vascular congestion. Cardiomediastinal silhouette and remainder
of the exam is unchanged.
IMPRESSION: Possible mild vascular congestion.
